# Patient Record
Sex: Female | Born: 1950 | Race: White | Hispanic: No | Marital: Married | Smoking: Former smoker
Health system: Southern US, Community
[De-identification: ages and names within clinical notes are randomized; demographics above are authoritative.]

## PROBLEM LIST (undated history)

## (undated) DIAGNOSIS — Z5111 Encounter for antineoplastic chemotherapy: Secondary | ICD-10-CM

## (undated) DIAGNOSIS — C3411 Malignant neoplasm of upper lobe, right bronchus or lung: Secondary | ICD-10-CM

## (undated) DIAGNOSIS — C539 Malignant neoplasm of cervix uteri, unspecified: Secondary | ICD-10-CM

## (undated) DIAGNOSIS — F419 Anxiety disorder, unspecified: Secondary | ICD-10-CM

## (undated) DIAGNOSIS — E119 Type 2 diabetes mellitus without complications: Secondary | ICD-10-CM

## (undated) HISTORY — PX: ABDOMINAL HYSTERECTOMY: SHX81

## (undated) HISTORY — DX: Encounter for antineoplastic chemotherapy: Z51.11

## (undated) MED FILL — Dexamethasone Sodium Phosphate Inj 100 MG/10ML: INTRAMUSCULAR | Qty: 1 | Status: AC

---

## 1998-11-20 ENCOUNTER — Ambulatory Visit (HOSPITAL_COMMUNITY): Admission: RE | Admit: 1998-11-20 | Discharge: 1998-11-20 | Payer: Self-pay

## 2003-06-20 ENCOUNTER — Encounter: Payer: Self-pay | Admitting: Family Medicine

## 2003-06-20 ENCOUNTER — Emergency Department (HOSPITAL_COMMUNITY): Admission: AD | Admit: 2003-06-20 | Discharge: 2003-06-20 | Payer: Self-pay | Admitting: Family Medicine

## 2003-06-23 ENCOUNTER — Emergency Department (HOSPITAL_COMMUNITY): Admission: AD | Admit: 2003-06-23 | Discharge: 2003-06-23 | Payer: Self-pay | Admitting: Family Medicine

## 2003-06-26 ENCOUNTER — Emergency Department (HOSPITAL_COMMUNITY): Admission: AD | Admit: 2003-06-26 | Discharge: 2003-06-26 | Payer: Self-pay | Admitting: Family Medicine

## 2013-06-27 ENCOUNTER — Emergency Department (HOSPITAL_COMMUNITY)
Admission: EM | Admit: 2013-06-27 | Discharge: 2013-06-27 | Disposition: A | Discharge status: Home / Self Care | Payer: Self-pay | Source: Home / Self Care

## 2013-06-27 ENCOUNTER — Ambulatory Visit: Payer: Self-pay

## 2013-06-27 ENCOUNTER — Other Ambulatory Visit: Payer: Self-pay | Admitting: Occupational Medicine

## 2013-06-27 DIAGNOSIS — R52 Pain, unspecified: Secondary | ICD-10-CM

## 2013-08-28 ENCOUNTER — Encounter: Payer: Self-pay | Admitting: Internal Medicine

## 2013-08-28 ENCOUNTER — Encounter (INDEPENDENT_AMBULATORY_CARE_PROVIDER_SITE_OTHER): Payer: Self-pay

## 2013-08-28 ENCOUNTER — Encounter: Payer: Self-pay | Admitting: *Deleted

## 2013-08-28 ENCOUNTER — Ambulatory Visit (INDEPENDENT_AMBULATORY_CARE_PROVIDER_SITE_OTHER): Payer: No Typology Code available for payment source | Admitting: Internal Medicine

## 2013-08-28 VITALS — BP 146/86 | HR 101 | Temp 98.1°F | Ht 64.0 in | Wt <= 1120 oz

## 2013-08-28 DIAGNOSIS — R222 Localized swelling, mass and lump, trunk: Secondary | ICD-10-CM

## 2013-08-28 DIAGNOSIS — R918 Other nonspecific abnormal finding of lung field: Secondary | ICD-10-CM

## 2013-08-28 MED ORDER — ALPRAZOLAM 0.5 MG PO TABS: 0.5000 mg | ORAL_TABLET | Freq: Every evening | ORAL | Status: DC | PRN

## 2013-08-28 MED ORDER — HYDROCODONE-ACETAMINOPHEN 10-325 MG PO TABS: 1.0000 | ORAL_TABLET | ORAL | Status: DC | PRN

## 2013-08-28 NOTE — Assessment & Plan Note (Signed)
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

## 2013-08-28 NOTE — Progress Notes (Signed)
   Subjective:    Patient ID: Alexis [name], female    DOB: 05/08/1951  MRN: [id number]  HPI  30 yowf smoker new onset hemoptysis mid Dec 2014 dx  with lung mass referred to pulmonary  08/28/2013 for eval.  08/28/2013 1st [facility] office visit/ Silena Wyss cc new onset abrupt variably severe hemoptysis mid dec 2014 assoc pleuritic and positional pain under R scapula inferiorly and some yellow mucus  rx as pna and hemoptysis improved but pain not better and barely tolerable on vicodin 5 q 4.  Still having daily prod  Up to sev tsp bloody mucus esp in ams.  CT c/w RUL mass and hilar adenopathy  No obvious pattern in  day to day or daytime variabilty or assoc sob or chest tightness, subjective wheeze overt sinus or hb symptoms. No unusual exp hx or h/o childhood pna/ asthma or knowledge of premature birth.  Sleeping ok without nocturnal  or early am exacerbation  of respiratory  c/o's or need for noct saba. Also denies any obvious fluctuation of symptoms with weather or environmental changes or other aggravating or alleviating factors except as outlined above   Current Medications, Allergies, Complete Past Medical History, Past Surgical History, Family History, and Social History were reviewed in [employer] record.          Review of Systems  Constitutional: Positive for unexpected weight change. Negative for fever and chills.  HENT: Negative for congestion, dental problem, ear pain, nosebleeds, postnasal drip, rhinorrhea, sinus pressure, sneezing, sore throat, trouble swallowing and voice change.   Eyes: Negative for visual disturbance.  Respiratory: Positive for cough. Negative for choking and shortness of breath.   Cardiovascular: Negative for chest pain and leg swelling.  Gastrointestinal: Negative for vomiting, abdominal pain and diarrhea.  Genitourinary: Negative for difficulty urinating.  Musculoskeletal: Negative for arthralgias.  Skin: Negative for rash.   Neurological: Negative for tremors, syncope and headaches.  Hematological: Does not bruise/bleed easily.       Objective:   Physical Exam  Anxious amb wf nad  Wt Readings from Last 3 Encounters:  08/28/13 11 lb (4.99 kg)      HEENT mild turbinate edema.  Oropharynx edentulous no thrush or excess pnd or cobblestoning.  No JVD or cervical adenopathy. Mild accessory muscle hypertrophy. Trachea midline, nl thryroid. Chest was hyperinflated by percussion with diminished breath sounds and moderate increased exp time without wheeze. Hoover sign positive at mid inspiration. Regular rate and rhythm without murmur gallop or rub or increase P2 or edema.  Abd: no hsm, nl excursion. Ext warm without cyanosis - POS mod severe clubbing.    Chest Ct [facility]12/22/14 4 cm RUL postapical mass with borderline adenopathy    Assessment & Plan:

## 2013-08-28 NOTE — Patient Instructions (Signed)
For pain take vicodin 10-325 every 4 hours as needed  For nerves take xanax 0.5 mg every 6 hours as needed   Nothing to eat after midnight Thursday  Come to outpatient registration at [facility]  Friday Jan 2 at 715 am for bronchoscopy

## 2013-08-30 ENCOUNTER — Ambulatory Visit (HOSPITAL_COMMUNITY)
Admission: RE | Admit: 2013-08-30 | Discharge: 2013-08-30 | Disposition: A | Discharge status: Home / Self Care | Payer: BC Managed Care – PPO | Source: Ambulatory Visit | Attending: Internal Medicine | Admitting: Internal Medicine

## 2013-08-30 ENCOUNTER — Encounter (HOSPITAL_COMMUNITY): Payer: Self-pay | Admitting: Respiratory Therapy

## 2013-08-30 ENCOUNTER — Ambulatory Visit (HOSPITAL_COMMUNITY): Payer: BC Managed Care – PPO

## 2013-08-30 ENCOUNTER — Encounter (HOSPITAL_COMMUNITY)
Admission: RE | Disposition: A | Discharge status: Home / Self Care | Payer: Self-pay | Source: Ambulatory Visit | Attending: Internal Medicine

## 2013-08-30 DIAGNOSIS — F17200 Nicotine dependence, unspecified, uncomplicated: Secondary | ICD-10-CM | POA: Insufficient documentation

## 2013-08-30 DIAGNOSIS — R222 Localized swelling, mass and lump, trunk: Secondary | ICD-10-CM

## 2013-08-30 DIAGNOSIS — J449 Chronic obstructive pulmonary disease, unspecified: Secondary | ICD-10-CM | POA: Insufficient documentation

## 2013-08-30 DIAGNOSIS — I517 Cardiomegaly: Secondary | ICD-10-CM | POA: Insufficient documentation

## 2013-08-30 DIAGNOSIS — J4489 Other specified chronic obstructive pulmonary disease: Secondary | ICD-10-CM | POA: Insufficient documentation

## 2013-08-30 DIAGNOSIS — C3410 Malignant neoplasm of upper lobe, unspecified bronchus or lung: Secondary | ICD-10-CM | POA: Insufficient documentation

## 2013-08-30 HISTORY — PX: VIDEO BRONCHOSCOPY: SHX5072

## 2013-08-30 SURGERY — BRONCHOSCOPY, WITH FLUOROSCOPY
Anesthesia: Moderate Sedation | Laterality: Bilateral

## 2013-08-30 MED ORDER — PHENYLEPHRINE HCL 0.25 % NA SOLN: 1.0000 | Freq: Four times a day (QID) | NASAL | Status: DC | PRN

## 2013-08-30 MED ORDER — SODIUM CHLORIDE 0.9 % IV SOLN
INTRAVENOUS | Status: DC
Start: 2013-08-30 — End: 2013-08-30

## 2013-08-30 MED ORDER — MIDAZOLAM HCL 10 MG/2ML IJ SOLN
INTRAMUSCULAR | Status: DC | PRN
  Administered 2013-08-30: 2.5 mg via INTRAVENOUS

## 2013-08-30 MED ORDER — MIDAZOLAM HCL 10 MG/2ML IJ SOLN: 1.0000 mg | Freq: Once | INTRAMUSCULAR | Status: DC

## 2013-08-30 MED ORDER — MEPERIDINE HCL 100 MG/ML IJ SOLN
INTRAMUSCULAR | Status: AC
  Filled 2013-08-30: qty 2

## 2013-08-30 MED ORDER — PHENYLEPHRINE HCL 0.25 % NA SOLN
NASAL | Status: DC | PRN
Start: 2013-08-30 — End: 2013-08-30
  Administered 2013-08-30: 1 via NASAL

## 2013-08-30 MED ORDER — LIDOCAINE HCL 2 % EX GEL
Freq: Once | CUTANEOUS | Status: DC
  Filled 2013-08-30: qty 5

## 2013-08-30 MED ORDER — LIDOCAINE HCL 2 % EX GEL
CUTANEOUS | Status: DC | PRN
  Administered 2013-08-30: 1

## 2013-08-30 MED ORDER — MEPERIDINE HCL 25 MG/ML IJ SOLN
INTRAMUSCULAR | Status: DC | PRN
  Administered 2013-08-30 (×2): 50 mg via INTRAVENOUS

## 2013-08-30 MED ORDER — MEPERIDINE HCL 100 MG/ML IJ SOLN: 100.0000 mg | Freq: Once | INTRAMUSCULAR | Status: DC

## 2013-08-30 MED ORDER — LIDOCAINE HCL 1 % IJ SOLN
INTRAMUSCULAR | Status: DC | PRN
  Administered 2013-08-30: 6 mL via RESPIRATORY_TRACT

## 2013-08-30 MED ORDER — MIDAZOLAM HCL 10 MG/2ML IJ SOLN
INTRAMUSCULAR | Status: AC
  Filled 2013-08-30: qty 4

## 2013-08-30 NOTE — Progress Notes (Signed)
Video Bronchoscopy done  Intervention Bronchial washing done Intervention Bronchial Biopsy done  Procedure tolerated well

## 2013-08-30 NOTE — H&P (Signed)
HPI   70 yowf smoker new onset hemoptysis mid Dec 2014 dx  with lung mass referred to pulmonary  08/28/2013 for eval.   08/28/2013 1st [place] office visit/ Alexis [name] cc new onset abrupt variably severe hemoptysis mid dec 2014 assoc pleuritic and positional pain under R scapula inferiorly and some yellow mucus  rx as pna and hemoptysis improved but pain not better and barely tolerable on vicodin 5 q 4.  Still having daily prod  Up to sev tsp bloody mucus esp in ams.   CT c/w RUL mass and hilar adenopathy   No obvious pattern in  day to day or daytime variabilty or assoc sob or chest tightness, subjective wheeze overt sinus or hb symptoms. No unusual exp hx or h/o childhood pna/ asthma or knowledge of premature birth.   Sleeping ok without nocturnal  or early am exacerbation  of respiratory  c/o's or need for noct saba. Also denies any obvious fluctuation of symptoms with weather or environmental changes or other aggravating or alleviating factors except as outlined above    Current Medications, Allergies, Complete Past Medical History, Past Surgical History, Family History, and Social History were reviewed in [employer] record.            Review of Systems  Constitutional: Positive for unexpected weight change. Negative for fever and chills.  HENT: Negative for congestion, dental problem, ear pain, nosebleeds, postnasal drip, rhinorrhea, sinus pressure, sneezing, sore throat, trouble swallowing and voice change.   Eyes: Negative for visual disturbance.  Respiratory: Positive for cough. Negative for choking and shortness of breath.   Cardiovascular: Negative for chest pain and leg swelling.  Gastrointestinal: Negative for vomiting, abdominal pain and diarrhea.  Genitourinary: Negative for difficulty urinating.  Musculoskeletal: Negative for arthralgias.  Skin: Negative for rash.  Neurological: Negative for tremors, syncope and headaches.   Hematological: Does not bruise/bleed easily.          Objective:     Physical Exam   Anxious amb wf nad   Wt Readings from Last 3 Encounters:   08/28/13  11 lb (4.99 kg)         HEENT mild turbinate edema.  Oropharynx edentulous no thrush or excess pnd or cobblestoning.  No JVD or cervical adenopathy. Mild accessory muscle hypertrophy. Trachea midline, nl thryroid. Chest was hyperinflated by percussion with diminished breath sounds and moderate increased exp time without wheeze. Hoover sign positive at mid inspiration. Regular rate and rhythm without murmur gallop or rub or increase P2 or edema.  Abd: no hsm, nl excursion. Ext warm without cyanosis - POS mod severe clubbing.      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 - Oral      ALPRAZolam (XANAX) 0.5 MG tablet 40 tablet 0 08/28/2013        Take 1 tablet (0.5 mg total) by mouth at bedtime as needed for anxiety. - Oral              Discontinued Medications        Reason for Discontinue      HYDROcodone-acetaminophen (NORCO) 10-325 MG per tablet Reorder                  Patient Instructions      For pain take vicodin 10-325 every 4 hours as needed   For nerves take xanax 0.5 mg every 6 hours as needed    Nothing to eat after midnight Thursday   Come to outpatient registration  at [place]  Friday Jan 2 at 34 am for bronchoscopy   08/30/2013 fob day, no change in hx or exam   [name], MD Pulmonary and [place] [phone] After 5:30 PM or weekends, call [phone]

## 2013-08-30 NOTE — Discharge Instructions (Signed)
Flexible Bronchoscopy Care After These instructions give you information on caring for yourself after your procedure. Your doctor may also give you specific instructions. Call your doctor if you have any problems or questions after your procedure. HOME CARE  Do not eat or drink anything for 2 hours after your test.  After 2 hours have passed, eat soft food and drink liquids slowly.  The day after the test, go back to eating as normal.  Continue normal activities.  Keep all doctor visits if tissue samples (biopsies) were taken. Finding out the results of your test Ask when your test results will be ready. Make sure you get your test results. GET HELP RIGHT AWAY IF:  You get lightheaded.  You get short of breath.  You feel like you are going to pass out (faint).  You have chest pain.  You cough up blood. MAKE SURE YOU:  Understand these instructions.  Will watch your condition.  Will get help right away if you are not doing well or get worse. Document Released: 06/12/2009 Document Revised: 11/07/2011 Document Reviewed: 06/12/2009 [facility] Patient Information 2014 [place], [place].  Nothing to eat or drink until 11:00 am  Today    08/30/2013

## 2013-08-30 NOTE — Op Note (Signed)
Bronchoscopy Procedure Note  Date of Operation: 08/30/2013   Pre-op Diagnosis: lung mass  Post-op Diagnosis: same  Surgeon: [name]  Anesthesia: Monitored Local Anesthesia with Sedation  Operation: Video Flexible fiberoptic bronchoscopy, diagnostic   Findings: nl  airways  Specimen: BAL RUL,  Tbbx x  RUL  Estimated Blood Loss: min   Complications: non  Indications and History: See updated H and P same date. The risks, benefits, complications, treatment options and expected outcomes were discussed with the patient.  The possibilities of reaction to medication, pulmonary aspiration, perforation of a viscus, bleeding, failure to diagnose a condition and creating a complication requiring transfusion or operation were discussed with the patient who freely signed the consent.    Description of Procedure: The patient was re-examined in the bronchoscopy suite and the site of surgery properly noted/marked.  The patient was identified  and the procedure verified as Flexible Fiberoptic Bronchoscopy.  A Time Out was held and the above information confirmed.   After the induction of topical nasopharyngeal anesthesia, the patient was positioned  and the bronchoscope was passed through the R naris. The vocal cords were visualized and  1% buffered lidocaine 5 ml was topically placed onto the cords. The cords were nl. The scope was then passed into the trachea.  1% buffered lidocaine given topically. Airways inspected bilaterally to the subsegmental level with the following findings:  1) all airways nl to the subsegmental level bilaterally   Procedures: 1)BAL for cytology 2) TBBX for histology      The Patient was taken to the Endoscopy Recovery area in satisfactory condition.  Attestation: I performed the procedure.  [name], MD Pulmonary and [place] [phone] After 5:30 PM or weekends, call [phone]

## 2013-09-02 ENCOUNTER — Encounter (HOSPITAL_COMMUNITY): Payer: Self-pay | Admitting: Internal Medicine

## 2013-09-02 ENCOUNTER — Encounter: Payer: Self-pay | Admitting: Internal Medicine

## 2013-09-02 ENCOUNTER — Telehealth: Payer: Self-pay | Admitting: Internal Medicine

## 2013-09-02 ENCOUNTER — Other Ambulatory Visit: Payer: Self-pay | Admitting: Internal Medicine

## 2013-09-02 DIAGNOSIS — R918 Other nonspecific abnormal finding of lung field: Secondary | ICD-10-CM

## 2013-09-02 NOTE — Telephone Encounter (Signed)
ATC line has fast busy signal x 3 wcb

## 2013-09-03 ENCOUNTER — Other Ambulatory Visit: Payer: Self-pay | Admitting: Internal Medicine

## 2013-09-03 MED ORDER — HYDROCODONE-ACETAMINOPHEN 10-325 MG PO TABS: 1.0000 | ORAL_TABLET | ORAL | Status: DC | PRN

## 2013-09-03 NOTE — Progress Notes (Signed)
Quick Note:  Pt aware ______ 

## 2013-09-04 NOTE — Telephone Encounter (Signed)
ATC line busy, wcb. Jennifer Castillo, CMA  

## 2013-09-05 NOTE — Telephone Encounter (Signed)
I spoke with the pt and she gave permission to speak with her sister. I spoke with her sister and she states the pt is having a hard time dealing with the news of her diagnosis. Dr. Melvyn Novas did prescribe the pt xanax but the pt sister states this is not helping. She states the pt is having very high, highs and very low, lows as far as mood goes. Pt is not resting well either. I advised them to contact PCP for further recs and meds since xanax from MW was not working well. They will do so. Munjor Bing, CMA

## 2013-09-09 ENCOUNTER — Encounter: Payer: Self-pay | Admitting: Internal Medicine

## 2013-09-09 ENCOUNTER — Ambulatory Visit (INDEPENDENT_AMBULATORY_CARE_PROVIDER_SITE_OTHER): Payer: BC Managed Care – PPO | Admitting: Internal Medicine

## 2013-09-09 ENCOUNTER — Encounter (HOSPITAL_COMMUNITY)
Admission: RE | Admit: 2013-09-09 | Discharge: 2013-09-09 | Disposition: A | Discharge status: Home / Self Care | Payer: BC Managed Care – PPO | Source: Ambulatory Visit | Attending: Internal Medicine | Admitting: Internal Medicine

## 2013-09-09 ENCOUNTER — Encounter: Payer: Self-pay | Admitting: *Deleted

## 2013-09-09 VITALS — BP 114/80 | HR 97 | Temp 98.0°F | Ht 64.5 in | Wt 109.0 lb

## 2013-09-09 DIAGNOSIS — R918 Other nonspecific abnormal finding of lung field: Secondary | ICD-10-CM

## 2013-09-09 DIAGNOSIS — J449 Chronic obstructive pulmonary disease, unspecified: Secondary | ICD-10-CM

## 2013-09-09 DIAGNOSIS — R222 Localized swelling, mass and lump, trunk: Secondary | ICD-10-CM

## 2013-09-09 LAB — GLUCOSE, CAPILLARY: Glucose-Capillary: 102 mg/dL — ABNORMAL HIGH (ref 70–99)

## 2013-09-09 MED ORDER — ALPRAZOLAM 0.5 MG PO TABS: ORAL_TABLET | ORAL | Status: DC

## 2013-09-09 MED ORDER — FLUDEOXYGLUCOSE F - 18 (FDG) INJECTION: 19.0000 | Freq: Once | INTRAVENOUS | Status: AC | PRN

## 2013-09-09 NOTE — Patient Instructions (Signed)
Please see patient coordinator before you leave today  to schedule T surgery evaluation asap  Call me if you have any question after you talk to the surgeons regarding feasibility and timing of surgery   In the meantime ok to use the nicotine patches and if they are not effective then ok to use the e cigarettes but you must not smoke x 2 weeks before surgery

## 2013-09-09 NOTE — Assessment & Plan Note (Signed)
-    Spirometry 09/09/2013   FEV1  1.80 (73%) ratio 67  Could tol RULobectomy if a candidate but needs to stop smoking first and would benefit from periop duoneb but nothing else needed for now

## 2013-09-09 NOTE — Assessment & Plan Note (Signed)
-   CT chest 08/19/13 [place] - FOB 08/30/13 > nl airways, tbbx c/w NSC favor adenoca - PET  09/09/2013 > 1. Right upper lobe lung mass has a maximum diameter of 3.9 cm and  is consistent with primary lung neoplasm.  2. Ground-glass attenuation and interstitial thickening is noted  within the surrounding a right upper lobe. In the setting of recent  biopsy these findings may reflect areas of post biopsy hemorrhage.  Alternatively, this may be seen with lymphangitic spread of tumor.  3. No evidence for hypermetabolic metastases.   I had an extended discussion with the patient today lasting 15 to 20 minutes of a 25 minute visit on the following issues:   Discussed in detail all the  indications, usual  risks and alternatives  relative to the benefits with patient   I don't see a contraindication to attempting surgical cure so will refer to T surgery - she agrees to consider surgery.

## 2013-09-09 NOTE — Progress Notes (Signed)
   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   History of Present Illness  08/28/2013 1st [facility] [place] [facility] office visit/ Alexis [name] cc new onset abrupt variably severe hemoptysis mid dec 2014 assoc pleuritic and positional pain under R scapula inferiorly and some yellow mucus  rx as pna and hemoptysis improved but pain not better and barely tolerable on vicodin 5 q 4.  Still having daily prod  Up to sev tsp bloody mucus esp in ams.  CT c/w RUL mass and hilar adenopathy > rec FOB  FOB 08/30/13 > nl airways, tbbx c/w Alexis [name] favor adenoca   09/09/2013 f/u ov/Alexis [name] re: [facility] RUL Chief Complaint  Patient presents with  . Follow-up    Pt had PET scan and spiro done today. She denies any new respiratory co's.      Not limited by breathing, still coughing up blood less than a tbsp per day  No obvious day to day or daytime variabilty or assoc   chest tightness, subjective wheeze overt sinus or hb symptoms. No unusual exp hx or h/o childhood pna/ asthma or knowledge of premature birth.  Sleeping ok without nocturnal  or early am exacerbation  of respiratory  c/o's or need for noct saba. Also denies any obvious fluctuation of symptoms with weather or environmental changes or other aggravating or alleviating factors except as outlined above   Current Medications, Allergies, Complete Past Medical History, Past Surgical History, Family History, and Social History were reviewed in [employer] record.  ROS  The following are not active complaints unless bolded sore throat, dysphagia, dental problems, itching, sneezing,  nasal congestion or excess/ purulent secretions, ear ache,   fever, chills, sweats, unintended wt loss, pleuritic or exertional cp,  hemoptysis,  orthopnea pnd or leg swelling, presyncope, palpitations, heartburn, abdominal pain, anorexia, nausea, vomiting, diarrhea  or change in bowel or urinary habits, change in stools or urine, dysuria,hematuria,  rash, arthralgias, visual complaints, headache, numbness weakness or ataxia or problems with walking or coordination,  change in mood/affect or memory.                     Objective:   Physical Exam  Anxious amb wf nad   Wt Readings from Last 3 Encounters:  09/09/13 109 lb (49.442 kg)  08/28/13 11 lb (4.99 kg)       HEENT mild turbinate edema.  Oropharynx edentulous no thrush or excess pnd or cobblestoning.  No JVD or cervical adenopathy. Mild accessory muscle hypertrophy. Trachea midline, nl thryroid. Chest was hyperinflated by percussion with diminished breath sounds and moderate increased exp time without wheeze. Hoover sign positive at mid inspiration. Regular rate and rhythm without murmur gallop or rub or increase P2 or edema.  Abd: no hsm, nl excursion. Ext warm without cyanosis - POS mod severe clubbing.    Chest Ct [facility]12/22/14 4 cm RUL postapical mass with borderline adenopathy  PET 09/09/2013 > neg hilar/ med nodes    Assessment & Plan:

## 2013-09-17 ENCOUNTER — Encounter: Payer: Self-pay | Admitting: Thoracic Surgery (Cardiothoracic Vascular Surgery)

## 2013-09-17 ENCOUNTER — Institutional Professional Consult (permissible substitution) (INDEPENDENT_AMBULATORY_CARE_PROVIDER_SITE_OTHER): Payer: BC Managed Care – PPO | Admitting: Thoracic Surgery (Cardiothoracic Vascular Surgery)

## 2013-09-17 VITALS — BP 123/57 | HR 100 | Resp 20 | Ht 64.5 in | Wt 109.0 lb

## 2013-09-17 DIAGNOSIS — R918 Other nonspecific abnormal finding of lung field: Secondary | ICD-10-CM

## 2013-09-17 DIAGNOSIS — R222 Localized swelling, mass and lump, trunk: Secondary | ICD-10-CM

## 2013-09-17 NOTE — Progress Notes (Signed)
PCP is [name], MD Referring Provider is [name], MD  Chief Complaint  Patient presents with  . Lung Mass    Surgical eval on right upper lobe lung mass- referral from Dr Melvyn Novas, PET Scan 09/09/13, Chest CT 08/19/13    HPI: 63 year old woman sent for consultation regarding a right upper lobe mass.  Alexis [name] is a 63 year old woman with a 40-pack-year history of tobacco abuse who was in her usual state of health until December. She developed a cough productive of yellow sputum and pain in the right subscapular region. She then developed hemoptysis. She was treated for potential pneumonia but continued to have pain and hemoptysis. A CT of the chest showed 4 cm mass in the right upper lobe. There was borderline enlargement of hilar or mediastinal lymph nodes. She was referred to Dr. Melvyn Novas he did bronchoscopy. 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 A PET CT was done which showed the right upper lobe mass to be hypermetabolic. It abuts the ribs, but there is no rib destruction or chest wall activity on the PET. There is no evidence of hilar or mediastinal involvement.  Past medical history  Cervical cancer 25 years ago  Tobacco abuse  Emphysema -newly diagnosed   No past medical history on file.  Past Surgical History  Procedure Laterality Date  . Video bronchoscopy Bilateral 08/30/2013    Procedure: VIDEO BRONCHOSCOPY WITH FLUORO;  Surgeon: Tanda Rockers, MD;  Location: [facility];  Service: Cardiopulmonary;  Laterality: Bilateral;    Family History  Problem Relation Age of Onset  . Emphysema Father     smoked  . Lung cancer Father     smoked    Social History History  Substance Use Topics  . Smoking status: Current Every Day Smoker -- 1.00 packs/day for 40 years    Types: Cigarettes  . Smokeless tobacco: Never Used  . Alcohol Use: No    Current Outpatient Prescriptions  Medication Sig Dispense Refill  . ALPRAZolam (XANAX) 0.5 MG tablet One four  times a day as needed for nerves or pain  120 tablet  0  . HYDROcodone-acetaminophen (NORCO) 10-325 MG per tablet Take 1 tablet by mouth every 4 (four) hours as needed.  40 tablet  0   No current facility-administered medications for this visit.    Allergies  Allergen Reactions  . Codeine Nausea And Vomiting    Review of Systems  Constitutional: Positive for appetite change, fatigue and unexpected weight change (Lost 12 pounds in 3 months).  Respiratory: Positive for cough (Hemoptysis).   All other systems reviewed and are negative.    BP 123/57  Pulse 100  Resp 20  Ht 5' 4.5" (1.638 m)  Wt 109 lb (49.442 kg)  BMI 18.43 kg/m2  SpO2 98% Physical Exam  Vitals reviewed. Constitutional: She is oriented to person, place, and time.  Thin 63 year old woman in no acute distress  HENT:  Head: Normocephalic and atraumatic.  Eyes: EOM are normal. Pupils are equal, round, and reactive to light.  Neck: Neck supple. No thyromegaly present.  Cardiovascular: Normal rate and regular rhythm.  Exam reveals no gallop and no friction rub.   No murmur heard. Pulmonary/Chest: Effort normal and breath sounds normal. She has no wheezes. She has no rales.  Abdominal: Soft. There is no tenderness.  Musculoskeletal: She exhibits no edema.  Positive clubbing, no cyanosis  Lymphadenopathy:    She has no cervical adenopathy.  Neurological: She is alert and oriented to person, place, and  time. No cranial nerve deficit.  No focal motor deficits  Skin: Skin is warm and dry.  Psychiatric: She has a normal mood and affect.     Diagnostic Tests: NUCLEAR MEDICINE PET SKULL BASE TO THIGH  FASTING BLOOD GLUCOSE: Value: 102mg /dl  TECHNIQUE:  19.0 mCi F-18 FDG was injected intravenously. CT data was obtained  and used for attenuation correction and anatomic localization only.  (This was not acquired as a diagnostic CT examination.) Additional  exam technical data entered on technologist worksheet.   COMPARISON: CT chest 12/ 22/14  FINDINGS:  NECK  No hypermetabolic lymph nodes in the neck.  CHEST  There is no pleural effusion identified. Right upper lobe lung mass  has a maximum diameter measuring 3.9 cm, image 64. The SUV max  associated with this mass is equal to 16.7, image 64. There is  surrounding ground-glass attenuation and interstitial thickening. If  the patient has had a recent biopsy within this area this may  reflect post biopsy hemorrhage. A similar appearance may be seen  with lymphangitic spread of tumor. There is no hypermetabolic  mediastinal or hilar lymph nodes. No hypermetabolic axillary or  supraclavicular adenopathy identified.  ABDOMEN/PELVIS  No abnormal hypermetabolic activity within the liver, pancreas,  adrenal glands, or spleen. No hypermetabolic lymph nodes in the  abdomen or pelvis. Colonic diverticulosis identified. There is a  focal segment of proximal transverse colon which exhibits abnormal  wall edema/thickening. This is best seen on image 163 of series 2  and is of on certain clinical significance. There is no significant  FDG uptake identified within this segment of thickened bowel.  SKELETON  No focal hypermetabolic activity to suggest skeletal metastasis.  IMPRESSION:  1. Right upper lobe lung mass has a maximum diameter of 3.9 cm and  is consistent with primary lung neoplasm.  2. Ground-glass attenuation and interstitial thickening is noted  within the surrounding a right upper lobe. In the setting of recent  biopsy these findings may reflect areas of post biopsy hemorrhage.  Alternatively, this may be seen with lymphangitic spread of tumor.  3. No evidence for hypermetabolic metastases.  Electronically Signed  By: Kerby Moors M.D.  On: 09/09/2013 15:27  Pulmonary function testing  FVC 2.70 (86%) FEV1 1.80 (73%)  FEF 25-75 1.28 (53%)  Impression:  Alexis [name] is a 63 year old woman with a 40-pack-year history of smoking who  presents with hemoptysis and right subscapular pain. She has a 4 cm hypermetabolic mass in the right upper lobe abutting the chest wall. 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 This likely is a T3 lesion with local invasion of the chest wall. This is a rather tricky location as it is abutting the ribs as they adjoin the transverse processes of the spine. This mass is likely resect would require chest wall resection.  She has no evidence of hilar or mediastinal involvement by PET CT. She has not yet had a brain MR and we will go ahead and order that prior to surgical resection.  Will discuss the case at our multidisciplinary thoracic oncology conference this week and she will most likely need a combination of surgery, radiation, and chemotherapy.  I did discuss potential surgical resection with her. She understands that the proposed procedure would be a right VATS, likely thoracotomy, right upper lobectomy, possible chest wall resection. I described the operation her in detail. She understands the need for general anesthesia, incisions to be used, expected hospital stay, and the overall recovery. We discussed  the indications, risks, benefits, and alternatives. She understands the risks include, but are not limited to death, MI, DVT, PE, bleeding, possible need for transfusion, infection, chronic pain, cardiac arrhythmias, prolonged air leaks, respiratory failure, renal failure, and gastrointestinal complications. She is willing to accept these risks and proceed with surgery.   Plan: 1. MR brain to rule out metastases  2. Discuss case at [facility]  3. I will see her back in the office in one week to finalize our plan

## 2013-09-18 ENCOUNTER — Other Ambulatory Visit: Payer: Self-pay | Admitting: *Deleted

## 2013-09-18 ENCOUNTER — Encounter: Payer: Self-pay | Admitting: *Deleted

## 2013-09-18 DIAGNOSIS — R918 Other nonspecific abnormal finding of lung field: Secondary | ICD-10-CM

## 2013-09-23 ENCOUNTER — Telehealth: Payer: Self-pay | Admitting: *Deleted

## 2013-09-23 NOTE — Telephone Encounter (Signed)
I called patient to f/u her visit with Dr. Roxan Hockey on 09/17/13.  Patient able to teackback the current plan of care as discussed at her visit.  Patient reports that she is doing OK and is anxious to complete the brain MRI on 10/01/13 so that she can proceed with her treatment.  She denied any questions or concerns at this time.  I verified that she had my contact information and encouraged her to call me for any needs.

## 2013-10-01 ENCOUNTER — Ambulatory Visit (INDEPENDENT_AMBULATORY_CARE_PROVIDER_SITE_OTHER): Payer: BC Managed Care – PPO | Admitting: Thoracic Surgery (Cardiothoracic Vascular Surgery)

## 2013-10-01 ENCOUNTER — Encounter: Payer: Self-pay | Admitting: Thoracic Surgery (Cardiothoracic Vascular Surgery)

## 2013-10-01 ENCOUNTER — Encounter: Payer: Self-pay | Admitting: Internal Medicine

## 2013-10-01 ENCOUNTER — Ambulatory Visit
Admission: RE | Admit: 2013-10-01 | Discharge: 2013-10-01 | Disposition: A | Discharge status: Home / Self Care | Payer: BC Managed Care – PPO | Source: Ambulatory Visit | Attending: Thoracic Surgery (Cardiothoracic Vascular Surgery) | Admitting: Thoracic Surgery (Cardiothoracic Vascular Surgery)

## 2013-10-01 VITALS — BP 125/64 | HR 109 | Resp 16 | Ht 64.5 in | Wt 109.0 lb

## 2013-10-01 DIAGNOSIS — R222 Localized swelling, mass and lump, trunk: Secondary | ICD-10-CM

## 2013-10-01 DIAGNOSIS — R918 Other nonspecific abnormal finding of lung field: Secondary | ICD-10-CM

## 2013-10-01 MED ORDER — GADOBENATE DIMEGLUMINE 529 MG/ML IV SOLN
9.0000 mL | Freq: Once | INTRAVENOUS | Status: AC | PRN
  Administered 2013-10-01: 9 mL via INTRAVENOUS

## 2013-10-01 NOTE — Progress Notes (Signed)
HPI:  Alexis [name] returns today to discuss the results of her MR.  Alexis [name] is a 63 year old woman with a 40-pack-year history of smoking who presented with hemoptysis and right subscapular pain. She has a 4 cm hypermetabolic mass in the right upper lobe abutting the chest wall. 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 This likely is a T3 lesion with local invasion of the chest wall. This is a rather tricky location as it is abutting the ribs as they adjoin the transverse processes of the spine. This mass is likely resectable, but would require chest wall resection.  We discussed last week the possibility of surgical resection but I wanted to get a brain MR first.  We did discuss the case it [place] last week in the general consensus was to proceed with surgery.  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 ALPRAZolam (XANAX) 0.5 MG tablet One four times a day as needed for nerves or pain  120 tablet  0  . HYDROcodone-acetaminophen (NORCO) 10-325 MG per tablet Take 1 tablet by mouth every 4 (four) hours as needed.  40 tablet  0   No current facility-administered medications for this visit.    Physical Exam BP 125/64  Pulse 109  Resp 16  Ht 5' 4.5" (1.638 m)  Wt 109 lb (49.442 kg)  BMI 18.43 kg/m2  SpO2 96% Thin 63 yo woman, appears older than stated age No acute distress Emotionally labile but no focal neurologic deficits  Diagnostic Tests: MR brain 10/01/2013 CLINICAL DATA: Lung mass.  BUN and creatinine were obtained on site at [place] at  [address]  Results: BUN 6.0 mg/dL, Creatinine 0.6 mg/dL.  EXAM:  MRI HEAD WITHOUT AND WITH CONTRAST  TECHNIQUE:  Multiplanar, multiecho pulse sequences of the brain and surrounding  structures were obtained without and with intravenous contrast.  CONTRAST: 21mL MULTIHANCE GADOBENATE DIMEGLUMINE  529 MG/ML IV SOLN  COMPARISON: None.  FINDINGS:  In a by enhancing subcortical lesion in the high right parietal lobe  measures 8 x 8.5 x 9.5 mm. No other focal enhancing lesions are  present. Scattered periventricular and subcortical white matter  changes are otherwise symmetric. No acute infarct or hemorrhage is  present. The ventricles are of normal size. No significant  extra-axial fluid collection is present. No focal lesion within the  left parietal skull is not expanded bone that measures 8 mm.  Restricted diffusion is evident within this lesion. Although there  is no definite enhancement, this raises concern for an osseous  metastasis.  Flow is present in the major intracranial arteries. The globes and  orbits are intact. A polyp or mucous retention cyst is present in  the right sphenoid sinus. There is opacification of a single left  ethmoid air cell. Mild mucosal thickening is noted along the  anterior left frontal sinus and at the floor of the maxillary  sinuses bilaterally, left greater than right.  IMPRESSION:  1. 8 x 8.5 x 9.5 mm ring-enhancing lesion in the right parietal lobe  with surrounding edema is most concerning for a focal metastasis.  2. 8 mm lesion in the left parietal skull is also concerning for  metastasis. This could represent a benign sclerotic lesion. No other  definite skull lesions are evident.  3. Mild atrophy and diffuse white matter disease. This likely  reflects the sequelae of chronic microvascular ischemia.  Electronically Signed  By: Lawrence Santiago M.D.  On: 10/01/2013 12:30   Impression: 63 year old woman with a history of tobacco abuse and COPD who has a large right upper lobe mass likely invading the rib cage posteriorly. 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 There also was a question of a skull metastasis on the MR.  At this point I think we should step back and have her formally evaluated by oncology and radiation  oncology, and possibly by neurosurgery as well. We need to come up with the multidisciplinary plan to best treat Alexis [name].  I will have her come to [place] this Thursday so that we can establish a definitive treatment plan.

## 2013-10-03 ENCOUNTER — Other Ambulatory Visit: Payer: Self-pay | Admitting: *Deleted

## 2013-10-03 ENCOUNTER — Encounter: Payer: Self-pay | Admitting: *Deleted

## 2013-10-03 ENCOUNTER — Ambulatory Visit
Admission: RE | Admit: 2013-10-03 | Discharge: 2013-10-03 | Disposition: A | Discharge status: Home / Self Care | Payer: BC Managed Care – PPO | Source: Ambulatory Visit | Attending: Radiation Oncology | Admitting: Radiation Oncology

## 2013-10-03 ENCOUNTER — Ambulatory Visit: Payer: BC Managed Care – PPO | Attending: Radiation Oncology | Admitting: Physical Therapy

## 2013-10-03 DIAGNOSIS — C7949 Secondary malignant neoplasm of other parts of nervous system: Principal | ICD-10-CM

## 2013-10-03 DIAGNOSIS — R293 Abnormal posture: Secondary | ICD-10-CM | POA: Insufficient documentation

## 2013-10-03 DIAGNOSIS — M549 Dorsalgia, unspecified: Secondary | ICD-10-CM | POA: Insufficient documentation

## 2013-10-03 DIAGNOSIS — R918 Other nonspecific abnormal finding of lung field: Secondary | ICD-10-CM

## 2013-10-03 DIAGNOSIS — C7931 Secondary malignant neoplasm of brain: Secondary | ICD-10-CM

## 2013-10-03 DIAGNOSIS — R222 Localized swelling, mass and lump, trunk: Secondary | ICD-10-CM | POA: Insufficient documentation

## 2013-10-03 DIAGNOSIS — R599 Enlarged lymph nodes, unspecified: Secondary | ICD-10-CM | POA: Insufficient documentation

## 2013-10-03 DIAGNOSIS — IMO0001 Reserved for inherently not codable concepts without codable children: Secondary | ICD-10-CM | POA: Insufficient documentation

## 2013-10-03 NOTE — Progress Notes (Signed)
Received order from Dr. Roxan Hockey regarding bone scan.  Test ordered.  Called radiology.  Pt aware of appt time and place.

## 2013-10-04 ENCOUNTER — Other Ambulatory Visit: Payer: Self-pay | Admitting: Radiation Therapy

## 2013-10-04 ENCOUNTER — Telehealth: Payer: Self-pay | Admitting: *Deleted

## 2013-10-04 DIAGNOSIS — C7949 Secondary malignant neoplasm of other parts of nervous system: Principal | ICD-10-CM

## 2013-10-04 DIAGNOSIS — C7931 Secondary malignant neoplasm of brain: Secondary | ICD-10-CM

## 2013-10-04 MED ORDER — DEXAMETHASONE 4 MG PO TABS: 4.0000 mg | ORAL_TABLET | Freq: Two times a day (BID) | ORAL | Status: DC

## 2013-10-04 NOTE — Telephone Encounter (Signed)
Pt called left vm message an I called her back.  She stated she was very upset that bone scan is 3 weeks away.  I apologized for the delay.  Elizebeth Koller RN will work on getting scan earlier.  I stated that I call the radiology dept and they give me a time and date for Feb 23.  At that time I requested an earlier appt time but they were unable to provide me with that.  I explained this to the pt.

## 2013-10-04 NOTE — Telephone Encounter (Signed)
Patient called and left a voicemail regarding questions she had from her appointment at the multidisciplinary clinic 10/03/13.  I returned her call and she asked for assistance to follow up regarding order for dexamethasone that needed to be called to her pharmacy, obtaining any lab work needed prior to her brain MRI on 10/08/13 and rescheduling her full body bone scan.  I spoke with Dr. [name] who ordered the dexamethasone, called [facility] who verified that she did not need additional lab work and was able to reschedule her bone scan at [facility] for 10/07/13.  I called patient back with information.  I spoke with patient who asked me to speak to her sister who expressed appreciation for my call and was able to verbalize appointment times.  Patient denied any other questions or needs at this time.

## 2013-10-07 ENCOUNTER — Encounter (HOSPITAL_COMMUNITY)
Admission: RE | Admit: 2013-10-07 | Discharge: 2013-10-07 | Disposition: A | Discharge status: Home / Self Care | Payer: BC Managed Care – PPO | Source: Ambulatory Visit | Attending: Thoracic Surgery (Cardiothoracic Vascular Surgery) | Admitting: Thoracic Surgery (Cardiothoracic Vascular Surgery)

## 2013-10-07 DIAGNOSIS — R918 Other nonspecific abnormal finding of lung field: Secondary | ICD-10-CM

## 2013-10-07 DIAGNOSIS — R222 Localized swelling, mass and lump, trunk: Secondary | ICD-10-CM | POA: Insufficient documentation

## 2013-10-07 MED ORDER — TECHNETIUM TC 99M MEDRONATE IV KIT
25.0000 | PACK | Freq: Once | INTRAVENOUS | Status: AC | PRN
  Administered 2013-10-07: 25 via INTRAVENOUS

## 2013-10-08 ENCOUNTER — Ambulatory Visit
Admission: RE | Admit: 2013-10-08 | Discharge: 2013-10-08 | Disposition: A | Discharge status: Home / Self Care | Payer: BC Managed Care – PPO | Source: Ambulatory Visit | Attending: Radiation Oncology | Admitting: Radiation Oncology

## 2013-10-08 ENCOUNTER — Encounter: Payer: Self-pay | Admitting: Radiation Oncology

## 2013-10-08 DIAGNOSIS — C7931 Secondary malignant neoplasm of brain: Secondary | ICD-10-CM | POA: Insufficient documentation

## 2013-10-08 DIAGNOSIS — C7949 Secondary malignant neoplasm of other parts of nervous system: Principal | ICD-10-CM

## 2013-10-08 MED ORDER — GADOBENATE DIMEGLUMINE 529 MG/ML IV SOLN
10.0000 mL | Freq: Once | INTRAVENOUS | Status: AC | PRN
  Administered 2013-10-08: 10 mL via INTRAVENOUS

## 2013-10-08 NOTE — Progress Notes (Signed)
Radiation Oncology         [phone] ________________________________  Name: DOROTHIA PASSMORE MRN: [id number]  Date: 10/03/2013  DOB: 1950-09-03  KK:XFGHWE,XHBZJ, MD  [name], *     REFERRING PHYSICIAN: [name], *   DIAGNOSIS: The encounter diagnosis was Secondary malignant neoplasm of brain and spinal cord.   HISTORY OF PRESENT ILLNESS::Alexis [name] is a 63 y.o. female who is seen for an initial consultation visit. The patient is has a 40-pack-year history of smoking. She presented with some right subscapular pain as well as some hemoptysis. The patient underwent workup including a chest rate x-ray as well as a CT scan. The latter demonstrated a right upper lobe lesion. This was suspicious for malignancy and she proceeded to undergo a PET scan on 09/09/2013. This showed hypermetabolic activity involving the right upper lobe lung mass with a maximum diameter of 3.9. The maximum SUV was 16.7. This was consistent with a primary lung neoplasm area at some groundglass attenuation and interstitial thickening was seen additionally within the right upper lobe.  The patient underwent a transbronchial biopsy on 08/30/2013. This showed a poorly differentiated non-small cell carcinoma. The any no phenotype was consistent with adenocarcinoma.  Further workup included an MRI scan of the brain on 10/01/2013. This showed an enhancing subcortical lesion within the right parietal lobe measuring 9.5 mm in maximum dimension. This was a solitary intracranial findings. There also was an 8 mm lesion within the left parietal skull which was concerning for possible metastasis versus benign sclerotic lesion. No other definite skull lesions were seen.  The patient denies any nausea, vision changes, recent headaches.  The patient's case was discussed in multidisciplinary thoracic conference this morning and I am seeing her in today for consideration of possible radiation  treatment.   PREVIOUS RADIATION THERAPY: No   PAST MEDICAL HISTORY:  has no past medical history on file.     PAST SURGICAL HISTORY: Past Surgical History  Procedure Laterality Date  . Video bronchoscopy Bilateral 08/30/2013    Procedure: VIDEO BRONCHOSCOPY WITH FLUORO;  Surgeon: Tanda Rockers, MD;  Location: [facility];  Service: Cardiopulmonary;  Laterality: Bilateral;     FAMILY HISTORY: family history includes COPD in her mother; Cancer in her mother; Emphysema in her father; Hypertension in her mother; Lung cancer in her father.   SOCIAL HISTORY:  reports that she has been smoking Cigarettes.  She has a 40 pack-year smoking history. She has never used smokeless tobacco. She reports that she does not drink alcohol or use illicit drugs.   ALLERGIES: Codeine   MEDICATIONS:  Current Outpatient Prescriptions  Medication Sig Dispense Refill  . ALPRAZolam (XANAX) 0.5 MG tablet One four times a day as needed for nerves or pain  120 tablet  0  . dexamethasone (DECADRON) 4 MG tablet Take 1 tablet (4 mg total) by mouth 2 (two) times daily with a meal.  60 tablet  0  . HYDROcodone-acetaminophen (NORCO) 10-325 MG per tablet Take 1 tablet by mouth every 4 (four) hours as needed.  40 tablet  0   No current facility-administered medications for this encounter.     REVIEW OF SYSTEMS:  A 15 point review of systems is documented in the electronic medical record. This was obtained by the nursing staff. However, I reviewed this with the patient to discuss relevant findings and make appropriate changes.  Pertinent items are noted in HPI.    PHYSICAL EXAM:  vitals were not taken  for this visit.  ECOG = 1  0 - Asymptomatic (Fully active, able to carry on all predisease activities without restriction)  1 - Symptomatic but completely ambulatory (Restricted in physically strenuous activity but ambulatory and able to carry out work of a light or sedentary nature. For example, light housework,  office work)  2 - Symptomatic, <50% in bed during the day (Ambulatory and capable of all self care but unable to carry out any work activities. Up and about more than 50% of waking hours)  3 - Symptomatic, >50% in bed, but not bedbound (Capable of only limited self-care, confined to bed or chair 50% or more of waking hours)  4 - Bedbound (Completely disabled. Cannot carry on any self-care. Totally confined to bed or chair)  5 - Death   Eustace Pen MM, Creech RH, Tormey DC, et al. [phone]). "Toxicity and response criteria of the [facility]". [place] Oncol. 5 (6): 649-55  General: Well-developed, in no acute distress HEENT: Normocephalic, atraumatic; oral cavity clear Neck: Supple without any lymphadenopathy Cardiovascular: Regular rate and rhythm Respiratory: Clear to auscultation bilaterally GI: Soft, nontender, normal bowel sounds Extremities: No edema present Neuro: No focal deficits     LABORATORY DATA:  No results found for this basename: WBC, HGB, HCT, MCV, PLT   No results found for this basename: NA, K, CL, CO2   No results found for this basename: ALT, AST, GGT, ALKPHOS, BILITOT      RADIOGRAPHY: Mr Kizzie Fantasia Contrast  10/01/2013   CLINICAL DATA:  Lung mass.  BUN and creatinine were obtained on site at [place] at  [address]  Results:  BUN 6.0 mg/dL,  Creatinine 0.6 mg/dL.  EXAM: MRI HEAD WITHOUT AND WITH CONTRAST  TECHNIQUE: Multiplanar, multiecho pulse sequences of the brain and surrounding structures were obtained without and with intravenous contrast.  CONTRAST:  42mL MULTIHANCE GADOBENATE DIMEGLUMINE 529 MG/ML IV SOLN  COMPARISON:  None.  FINDINGS: In a by enhancing subcortical lesion in the high right parietal lobe measures 8 x 8.5 x 9.5 mm. No other focal enhancing lesions are present. Scattered periventricular and subcortical white matter changes are otherwise symmetric. No acute infarct or hemorrhage is present. The ventricles  are of normal size. No significant extra-axial fluid collection is present. No focal lesion within the left parietal skull is not expanded bone that measures 8 mm. Restricted diffusion is evident within this lesion. Although there is no definite enhancement, this raises concern for an osseous metastasis.  Flow is present in the major intracranial arteries. The globes and orbits are intact. A polyp or mucous retention cyst is present in the right sphenoid sinus. There is opacification of a single left ethmoid air cell. Mild mucosal thickening is noted along the anterior left frontal sinus and at the floor of the maxillary sinuses bilaterally, left greater than right.  IMPRESSION: 1. 8 x 8.5 x 9.5 mm ring-enhancing lesion in the right parietal lobe with surrounding edema is most concerning for a focal metastasis. 2. 8 mm lesion in the left parietal skull is also concerning for metastasis. This could represent a benign sclerotic lesion. No other definite skull lesions are evident. 3. Mild atrophy and diffuse white matter disease. This likely reflects the sequelae of chronic microvascular ischemia.   Electronically Signed   By: Lawrence Santiago M.D.   On: 10/01/2013 12:30   Nm Bone Scan Whole Body  10/07/2013   CLINICAL DATA:  Lung cancer  EXAM:  NUCLEAR MEDICINE WHOLE BODY BONE SCAN  TECHNIQUE: Whole body anterior and posterior images were obtained approximately 3 hours after intravenous injection of radiopharmaceutical.  COMPARISON:  PET-CT 09/09/2013  RADIOPHARMACEUTICALS:  25.0 Technetium-99 MDP  FINDINGS: No areas of abnormal uptake are identified to suggest osseous metastatic disease.  IMPRESSION: Negative whole body bone scan for osseous metastatic disease.   Electronically Signed   By: Kalman Jewels M.D.   On: 10/07/2013 15:08   Nm Pet Image Initial (pi) Skull Base To Thigh  09/09/2013   CLINICAL DATA:  Initial treatment strategy for Lung cancer.  EXAM: NUCLEAR MEDICINE PET SKULL BASE TO THIGH  FASTING BLOOD  GLUCOSE:  Value: 102mg /dl  TECHNIQUE: 19.0 mCi F-18 FDG was injected intravenously. CT data was obtained and used for attenuation correction and anatomic localization only. (This was not acquired as a diagnostic CT examination.) Additional exam technical data entered on technologist worksheet.  COMPARISON:  CT chest 12/ 22/14  FINDINGS: NECK  No hypermetabolic lymph nodes in the neck.  CHEST  There is no pleural effusion identified. Right upper lobe lung mass has a maximum diameter measuring 3.9 cm, image 64. The SUV max associated with this mass is equal to 16.7, image 64. There is surrounding ground-glass attenuation and interstitial thickening. If the patient has had a recent biopsy within this area this may reflect post biopsy hemorrhage. A similar appearance may be seen with lymphangitic spread of tumor. There is no hypermetabolic mediastinal or hilar lymph nodes. No hypermetabolic axillary or supraclavicular adenopathy identified.  ABDOMEN/PELVIS  No abnormal hypermetabolic activity within the liver, pancreas, adrenal glands, or spleen. No hypermetabolic lymph nodes in the abdomen or pelvis. Colonic diverticulosis identified. There is a focal segment of proximal transverse colon which exhibits abnormal wall edema/thickening. This is best seen on image 163 of series 2 and is of on certain clinical significance. There is no significant FDG uptake identified within this segment of thickened bowel.  SKELETON  No focal hypermetabolic activity to suggest skeletal metastasis.  IMPRESSION: 1. Right upper lobe lung mass has a maximum diameter of 3.9 cm and is consistent with primary lung neoplasm. 2. Ground-glass attenuation and interstitial thickening is noted within the surrounding a right upper lobe. In the setting of recent biopsy these findings may reflect areas of post biopsy hemorrhage. Alternatively, this may be seen with lymphangitic spread of tumor. 3. No evidence for hypermetabolic metastases.    Electronically Signed   By: Kerby Moors M.D.   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 She has a solitary brain metastasis in addition to an uncertain skull lesion, potentially a metastasis. The patient had her case discussed in multidisciplinary thoracic conference this morning and further workup was recommended including a bone scan to further characterize the skull lesion. The appropriate approach to the thoracic disease may depend on this, potentially surgical resection versus a less aggressive approach. The patient does appear to be a good candidate for radiosurgery to the solitary subcentimeter brain metastasis and I discussed this with the patient today.  I discussed with the patient the rationale and potential benefit from a course of radiosurgery. We also discussed the possible side effects and risks of treatment. All of her questions were answered. The patient does wish to proceed with this treatment.   PLAN: The patient will be scheduled for a simulation such that we can proceed with treatment  planning for radiosurgery. She also will be scheduled for evaluation by neurosurgery. We will followup on her results from the bone scan. This will help clarify the appropriate role for potential additional treatment to the possible skull metastasis as well as the appropriate local treatment for the patient's thoracic tumor.    I spent 60 minutes face to face with the patient and more than 50% of that time was spent in counseling and/or coordination of care.    ________________________________   Jodelle Gross, MD, [name]

## 2013-10-08 NOTE — Progress Notes (Signed)
Forwarded FMLA paperwork to RN for completion.

## 2013-10-09 ENCOUNTER — Ambulatory Visit
Admission: RE | Admit: 2013-10-09 | Discharge: 2013-10-09 | Disposition: A | Discharge status: Home / Self Care | Payer: BC Managed Care – PPO | Source: Ambulatory Visit | Attending: Radiation Oncology | Admitting: Radiation Oncology

## 2013-10-09 VITALS — BP 140/80 | HR 80 | Temp 98.0°F | Ht 64.5 in | Wt 111.3 lb

## 2013-10-09 DIAGNOSIS — C7931 Secondary malignant neoplasm of brain: Secondary | ICD-10-CM | POA: Insufficient documentation

## 2013-10-09 DIAGNOSIS — C7949 Secondary malignant neoplasm of other parts of nervous system: Secondary | ICD-10-CM

## 2013-10-09 DIAGNOSIS — C3490 Malignant neoplasm of unspecified part of unspecified bronchus or lung: Secondary | ICD-10-CM | POA: Insufficient documentation

## 2013-10-09 DIAGNOSIS — Z510 Encounter for antineoplastic radiation therapy: Secondary | ICD-10-CM | POA: Insufficient documentation

## 2013-10-09 MED ORDER — SODIUM CHLORIDE 0.9 % IJ SOLN
10.0000 mL | Freq: Once | INTRAMUSCULAR | Status: AC
  Administered 2013-10-09: 10 mL via INTRAVENOUS

## 2013-10-09 NOTE — Progress Notes (Signed)
Inserted #22 gauge IV into patient's right AC.  Blood return noted.  Secured with tegaderm and tape.  Patient tolerated well.

## 2013-10-16 ENCOUNTER — Ambulatory Visit
Admission: RE | Admit: 2013-10-16 | Discharge: 2013-10-16 | Disposition: A | Discharge status: Home / Self Care | Payer: BC Managed Care – PPO | Source: Ambulatory Visit | Attending: Radiation Oncology | Admitting: Radiation Oncology

## 2013-10-16 ENCOUNTER — Encounter: Payer: Self-pay | Admitting: Radiation Oncology

## 2013-10-16 VITALS — BP 104/57 | HR 69 | Temp 97.3°F | Resp 16

## 2013-10-16 DIAGNOSIS — C7949 Secondary malignant neoplasm of other parts of nervous system: Principal | ICD-10-CM

## 2013-10-16 DIAGNOSIS — C7931 Secondary malignant neoplasm of brain: Secondary | ICD-10-CM

## 2013-10-16 NOTE — Addendum Note (Signed)
Encounter addended by: Marye Round, MD on: 10/16/2013  5:48 PM<BR>     Documentation filed: Clinical Notes, Notes Section

## 2013-10-16 NOTE — Progress Notes (Signed)
  Radiation Oncology         [phone] ________________________________  Name: Alexis [name] MRN: [id number]  Date: 10/16/2013  DOB: 05-Aug-1951   SPECIAL TREATMENT PROCEDURE   3D TREATMENT PLANNING AND DOSIMETRY: The patient's radiation plan was reviewed and approved by Dr. Kathyrn Sheriff from neurosurgery and radiation oncology prior to treatment. It showed 3-dimensional radiation distributions overlaid onto the planning CT/MRI image set. The [facility] for the target structures as well as the organs at risk were reviewed. The documentation of the 3D plan and dosimetry are filed in the radiation oncology EMR.   NARRATIVE: The patient was brought to the TrueBeam stereotactic radiation treatment machine and placed supine on the CT couch. The head frame was applied, and the patient was set up for stereotactic radiosurgery. Neurosurgery was present for the set-up and delivery   SIMULATION VERIFICATION: In the couch zero-angle position, the patient underwent Exactrac imaging using the Brainlab system with orthogonal KV images. These were carefully aligned and repeated to confirm treatment position for each of the isocenters. The Exactrac snap film verification was repeated at each couch angle.   SPECIAL TREATMENT PROCEDURE: The patient received stereotactic radiosurgery to the following targets:  Rt Parietal 27mm target was treated using 3 Arcs to a prescription dose of 20 Gy. ExacTrac Snap verification was performed for each couch angle.   STEREOTACTIC TREATMENT MANAGEMENT: Following delivery, the patient was transported to nursing in stable condition and monitored for possible acute effects. Vital signs were recorded . The patient tolerated treatment without significant acute effects, and was discharged to home in stable condition.  PLAN: Follow-up in one month.   ------------------------------------------------  Jodelle Gross, MD, [name]

## 2013-10-16 NOTE — Progress Notes (Signed)
Vitals taken post SRS brain, patient in good spirits, vitals wnl, no c/o pain, headache,nausea, or dizziness stated or noted, offered warm blanket, cola,graham crackers and peanut butter, coffee given to spouse, monitor 1 hour Recheck vitals 1450pm 1:01 PM 2nd set vitals taken 1:44pm wnl, no c/o pain, nausea,headache,fever,no dizziness, pateitnt stated"I'm doing great", offerred coffee again,, patient d/c home via ambulatory no driving today, will go home and rest will call for any unusual symptoms if they occur stated patient 1:45 PM

## 2013-10-16 NOTE — Progress Notes (Signed)
  Radiation Oncology         [phone] ________________________________  Name: Alexis [name] MRN: [id number]  Date: 10/09/2013  DOB: 02-26-1951  SIMULATION AND TREATMENT PLANNING NOTE  DIAGNOSIS:  Metastatic lung cancer with brain metastasis  NARRATIVE:  The patient was brought to the [place] suite.  Identity was confirmed.  All relevant records and images related to the planned course of therapy were reviewed.  The patient freely provided informed written consent to proceed with treatment after reviewing the details related to the planned course of therapy. The consent form was witnessed and verified by the simulation staff. Intravenous access was established for contrast administration. Then, the patient was set-up in a stable reproducible supine position for radiation therapy.  A relocatable thermoplastic stereotactic head frame was fabricated for precise immobilization.  CT images were obtained.  Surface markings were placed.  The CT images were loaded into the planning software and fused with the patient's targeting MRI scan.  Then the target and avoidance structures were contoured.  Treatment planning then occurred.  The radiation prescription was entered and confirmed.  I have requested 3D planning  I have requested a DVH of the following structures: Brain stem, brain, left eye, right I, lenses, optic chiasm, target volumes, uninvolved brain, and normal tissue.    PLAN:  The patient will receive 20 Gy in 1 fraction to the right parietal intracranial lesion.  ________________________________  Jodelle Gross, MD, [name]

## 2013-10-16 NOTE — Progress Notes (Signed)
  Radiation Oncology         [phone] ________________________________  Name: Alexis [name] MRN: [id number]  Date: 10/16/2013  DOB: July 17, 1951  End of Treatment Note  Diagnosis:   Metastatic lung cancer with brain metastasis     Indication for treatment:  palliative       Radiation treatment dates:   10/16/2013  Site/dose:    Rt Parietal 86mm target was treated using 3 Arcs to a prescription dose of 20 Gy. ExacTrac Snap verification was performed for each couch angle.   Narrative: The patient tolerated radiation treatment relatively well.   She exhibited no sings of acute toxicity.  Plan: The patient will be scheduled for simulation for treatment planning for thoracic radiotherapy, anticipated to be a preoperative course of radiation with chemotherapy.  ________________________________  Jodelle Gross, M.D., Ph.D.

## 2013-10-16 NOTE — Patient Instructions (Signed)
Take decadron 1 tablet x2 per day for 3 days, then 1 tablet x1 per day for 5 days, then 1/2 tablet x1 per day for 5 days.

## 2013-10-17 ENCOUNTER — Ambulatory Visit
Admission: RE | Admit: 2013-10-17 | Discharge: 2013-10-17 | Disposition: A | Discharge status: Home / Self Care | Payer: BC Managed Care – PPO | Source: Ambulatory Visit | Attending: Radiation Oncology | Admitting: Radiation Oncology

## 2013-10-17 DIAGNOSIS — B370 Candidal stomatitis: Secondary | ICD-10-CM | POA: Insufficient documentation

## 2013-10-17 DIAGNOSIS — Z510 Encounter for antineoplastic radiation therapy: Secondary | ICD-10-CM | POA: Insufficient documentation

## 2013-10-17 DIAGNOSIS — C7949 Secondary malignant neoplasm of other parts of nervous system: Secondary | ICD-10-CM

## 2013-10-17 DIAGNOSIS — Z79899 Other long term (current) drug therapy: Secondary | ICD-10-CM | POA: Insufficient documentation

## 2013-10-17 DIAGNOSIS — J029 Acute pharyngitis, unspecified: Secondary | ICD-10-CM | POA: Insufficient documentation

## 2013-10-17 DIAGNOSIS — R110 Nausea: Secondary | ICD-10-CM | POA: Insufficient documentation

## 2013-10-17 DIAGNOSIS — Z933 Colostomy status: Secondary | ICD-10-CM | POA: Insufficient documentation

## 2013-10-17 DIAGNOSIS — C3410 Malignant neoplasm of upper lobe, unspecified bronchus or lung: Secondary | ICD-10-CM | POA: Insufficient documentation

## 2013-10-17 DIAGNOSIS — C7931 Secondary malignant neoplasm of brain: Secondary | ICD-10-CM | POA: Insufficient documentation

## 2013-10-17 NOTE — Addendum Note (Signed)
Encounter addended by: Consuella Lose, MD on: 10/17/2013  7:58 AM<BR>     Documentation filed: Notes Section

## 2013-10-17 NOTE — Op Note (Signed)
Name: Alexis [name]  MRN: [id number]  Date: 10/16/2013   DOB: October 16, 1950  STEREOTACTIC RADIOSURGERY OPERATIVE NOTE  PRE-OPERATIVE DIAGNOSIS:  Right Parietal Brain Met  POST-OPERATIVE DIAGNOSIS:  Same  PROCEDURE:  Stereotactic Radiosurgery  SURGEON:  Consuella Lose, MD  RADIATION ONCOLOGIST: Kyung Rudd, MD  TECHNIQUE:  The patient underwent a radiation treatment planning session in the radiation oncology simulation suite under the care of the radiation oncology physician and physicist.  I participated closely in the radiation treatment planning afterwards. The patient underwent planning CT which was fused to 3T high resolution MRI with 1 mm axial slices.  These images were fused on the planning system.  We contoured the gross target volumes and subsequently expanded this to yield the Planning Target Volume. I actively participated in the planning process.  I helped to define and review the target contours and also the contours of the optic pathway, eyes, brainstem and selected nearby organs at risk.  All the dose constraints for critical structures were reviewed and compared to AAPM Task Group 101.  The prescription dose conformity was reviewed.  I approved the plan electronically.    Accordingly, Alexis [name]  was brought to the TrueBeam stereotactic radiation treatment linac and placed in the custom immobilization mask.  The patient was aligned according to the IR fiducial markers with BrainLab Exactrac, then orthogonal x-rays were used in ExacTrac with the 6DOF robotic table and the shifts were made to align the patient  Alexis [name] received stereotactic radiosurgery to a prescription dose of 20Gy uneventfully.    The detailed description of the procedure is recorded in the radiation oncology procedure note.  I was present for the duration of the procedure.  DISPOSITION:   Following delivery, the patient was transported to nursing in stable condition and monitored for possible acute  effects to be discharged to home in stable condition with follow-up in one month.  Consuella Lose, MD [facility]

## 2013-10-21 ENCOUNTER — Encounter (HOSPITAL_COMMUNITY): Payer: BC Managed Care – PPO

## 2013-10-28 ENCOUNTER — Ambulatory Visit
Admission: RE | Admit: 2013-10-28 | Discharge: 2013-10-28 | Disposition: A | Discharge status: Home / Self Care | Payer: BC Managed Care – PPO | Source: Ambulatory Visit | Attending: Radiation Oncology | Admitting: Radiation Oncology

## 2013-10-29 ENCOUNTER — Ambulatory Visit
Admission: RE | Admit: 2013-10-29 | Discharge: 2013-10-29 | Disposition: A | Discharge status: Home / Self Care | Payer: BC Managed Care – PPO | Source: Ambulatory Visit | Attending: Radiation Oncology | Admitting: Radiation Oncology

## 2013-10-30 ENCOUNTER — Ambulatory Visit
Admission: RE | Admit: 2013-10-30 | Discharge: 2013-10-30 | Disposition: A | Discharge status: Home / Self Care | Payer: BC Managed Care – PPO | Source: Ambulatory Visit | Attending: Radiation Oncology | Admitting: Radiation Oncology

## 2013-10-31 ENCOUNTER — Other Ambulatory Visit: Payer: Self-pay | Admitting: Radiation Oncology

## 2013-10-31 ENCOUNTER — Telehealth: Payer: Self-pay | Admitting: *Deleted

## 2013-10-31 ENCOUNTER — Ambulatory Visit: Payer: BC Managed Care – PPO

## 2013-10-31 MED ORDER — ONDANSETRON HCL 8 MG PO TABS: 8.0000 mg | ORAL_TABLET | Freq: Three times a day (TID) | ORAL | Status: DC | PRN

## 2013-10-31 NOTE — Telephone Encounter (Signed)
Returned call to patient, sh stated she has been throwing up since 8pm last night and can't keep anything down, c/o stomach pain, weakness,chills, no nausea medication on file, also husband having same symptoms stated, sounds like  Stomach virus will e-mail Dr.Moody and will call her back afet speaking with him, she said she is still taking her decadron abut her tongue is dry and flaky., she uses [facility] on [address], advised her to  , take sips ginger ale,sprite and saltine crackers, chicken broth ,she said she will try and will wait on call back 9:34 AM'

## 2013-10-31 NOTE — Telephone Encounter (Signed)
Per Dr.Moody he is e-scribing medication for nausea for patient spoke with ms.Yip, informed her once nuasea medication is on board to start pushing fluids, she has no fever MD will look at her tongue/throat tomorrow ,patient said to thank MD and qquick return call  10:00 AM

## 2013-11-01 ENCOUNTER — Ambulatory Visit
Admission: RE | Admit: 2013-11-01 | Discharge: 2013-11-01 | Disposition: A | Discharge status: Home / Self Care | Payer: BC Managed Care – PPO | Source: Ambulatory Visit | Attending: Radiation Oncology | Admitting: Radiation Oncology

## 2013-11-01 ENCOUNTER — Encounter: Payer: Self-pay | Admitting: Radiation Oncology

## 2013-11-01 VITALS — BP 112/69 | HR 98 | Temp 98.6°F | Resp 20 | Wt 115.1 lb

## 2013-11-01 DIAGNOSIS — C7949 Secondary malignant neoplasm of other parts of nervous system: Principal | ICD-10-CM

## 2013-11-01 DIAGNOSIS — C3411 Malignant neoplasm of upper lobe, right bronchus or lung: Secondary | ICD-10-CM | POA: Insufficient documentation

## 2013-11-01 DIAGNOSIS — C7931 Secondary malignant neoplasm of brain: Secondary | ICD-10-CM

## 2013-11-01 DIAGNOSIS — C3410 Malignant neoplasm of upper lobe, unspecified bronchus or lung: Secondary | ICD-10-CM

## 2013-11-01 MED ORDER — BIAFINE EX EMUL: CUTANEOUS | Status: DC | PRN

## 2013-11-01 MED ORDER — FLUCONAZOLE 100 MG PO TABS: 100.0000 mg | ORAL_TABLET | Freq: Every day | ORAL | Status: DC

## 2013-11-01 NOTE — Progress Notes (Addendum)
Weekly rad txs 4  Rt  Lung, completed, biafine cream given, rad book  No skin changes on neck/chest , zofran helped her nausea stated, has started pushing fluids,  Ginger ale, does have thrush on tongue,  Discussed side efects, skin irritation,diet,increase protein, sdtart with brat diet today, chicken soup, pain, throat changes, fatigue, sob, taste changes,  Pain,  Business card given, Teach back  Given, sore throat, weak but better 11:46 AM

## 2013-11-01 NOTE — Progress Notes (Addendum)
   Department of Radiation Oncology  Phone:  [phone] Fax:        [phone]  Weekly Treatment Note    Name: Alexis [name] Date: 11/01/2013 MRN: [id number] DOB: [birth year]   Current dose: 8 Gy  Current fraction: 4   MEDICATIONS: Current Outpatient Prescriptions  Medication Sig Dispense Refill  . emollient (BIAFINE) cream Apply 1 application topically daily. Apply daily after rad tx      . ALPRAZolam (XANAX) 0.5 MG tablet One four times a day as needed for nerves or pain  120 tablet  0  . dexamethasone (DECADRON) 4 MG tablet Take 1 tablet (4 mg total) by mouth 2 (two) times daily with a meal.  60 tablet  0  . fluconazole (DIFLUCAN) 100 MG tablet Take 1 tablet (100 mg total) by mouth daily. Take 2 tabs x 1, then 1 tab QD x 9 additional days.  11 tablet  0  . HYDROcodone-acetaminophen (NORCO) 10-325 MG per tablet Take 1 tablet by mouth every 4 (four) hours as needed.  40 tablet  0  . ondansetron (ZOFRAN) 8 MG tablet Take 1 tablet (8 mg total) by mouth every 8 (eight) hours as needed for nausea or vomiting.  30 tablet  0   Current Facility-Administered Medications  Medication Dose Route Frequency Provider Last Rate Last Dose  . topical emolient (BIAFINE) emulsion   Topical PRN Marye Round, MD         ALLERGIES: Codeine   LABORATORY DATA:  No results found for this basename: WBC, HGB, HCT, MCV, PLT   No results found for this basename: NA, K, CL, CO2   No results found for this basename: ALT, AST, GGT, ALKPHOS, BILITOT     NARRATIVE: Alexis [name] was seen today for weekly treatment management. The chart was checked and the patient's films were reviewed. The patient states that she is doing relatively well with her treatment at this point. She does complain of some sore throat. She was noted by nursing to have some thrush present.  PHYSICAL EXAMINATION: weight is 115 lb 1.6 oz (52.209 kg). Her oral temperature is 98.6 F (37 C). Her blood pressure is 112/69 and  her pulse is 98. Her respiration is 20 and oxygen saturation is 99%.      thrush is present within the oral cavity ASSESSMENT: The patient is doing satisfactorily with treatment.  PLAN: We will continue with the patient's radiation treatment as planned. Prescription for Diflucan given.

## 2013-11-03 ENCOUNTER — Encounter (HOSPITAL_COMMUNITY): Payer: BC Managed Care – PPO | Admitting: Certified Registered"

## 2013-11-03 ENCOUNTER — Inpatient Hospital Stay (HOSPITAL_COMMUNITY)
Admission: EM | Admit: 2013-11-03 | Discharge: 2013-11-18 | DRG: 329 | Disposition: A | Discharge status: Home Health | Payer: BC Managed Care – PPO | Attending: General Surgery | Admitting: General Surgery

## 2013-11-03 ENCOUNTER — Emergency Department (HOSPITAL_COMMUNITY): Payer: BC Managed Care – PPO

## 2013-11-03 ENCOUNTER — Encounter (HOSPITAL_COMMUNITY): Payer: Self-pay | Admitting: Emergency Medicine

## 2013-11-03 ENCOUNTER — Emergency Department (HOSPITAL_COMMUNITY): Payer: BC Managed Care – PPO | Admitting: Certified Registered"

## 2013-11-03 ENCOUNTER — Encounter (HOSPITAL_COMMUNITY): Admission: EM | Disposition: A | Discharge status: Home Health | Payer: Self-pay | Source: Home / Self Care

## 2013-11-03 DIAGNOSIS — K631 Perforation of intestine (nontraumatic): Secondary | ICD-10-CM

## 2013-11-03 DIAGNOSIS — R5383 Other fatigue: Secondary | ICD-10-CM

## 2013-11-03 DIAGNOSIS — Z87891 Personal history of nicotine dependence: Secondary | ICD-10-CM

## 2013-11-03 DIAGNOSIS — R1084 Generalized abdominal pain: Secondary | ICD-10-CM | POA: Diagnosis present

## 2013-11-03 DIAGNOSIS — C7931 Secondary malignant neoplasm of brain: Secondary | ICD-10-CM | POA: Diagnosis present

## 2013-11-03 DIAGNOSIS — E43 Unspecified severe protein-calorie malnutrition: Secondary | ICD-10-CM | POA: Clinically undetermined

## 2013-11-03 DIAGNOSIS — K668 Other specified disorders of peritoneum: Secondary | ICD-10-CM | POA: Diagnosis present

## 2013-11-03 DIAGNOSIS — I10 Essential (primary) hypertension: Secondary | ICD-10-CM | POA: Diagnosis present

## 2013-11-03 DIAGNOSIS — N736 Female pelvic peritoneal adhesions (postinfective): Secondary | ICD-10-CM | POA: Diagnosis present

## 2013-11-03 DIAGNOSIS — Z801 Family history of malignant neoplasm of trachea, bronchus and lung: Secondary | ICD-10-CM

## 2013-11-03 DIAGNOSIS — R112 Nausea with vomiting, unspecified: Secondary | ICD-10-CM | POA: Diagnosis present

## 2013-11-03 DIAGNOSIS — IMO0002 Reserved for concepts with insufficient information to code with codable children: Secondary | ICD-10-CM

## 2013-11-03 DIAGNOSIS — K651 Peritoneal abscess: Secondary | ICD-10-CM

## 2013-11-03 DIAGNOSIS — K630 Abscess of intestine: Secondary | ICD-10-CM | POA: Diagnosis present

## 2013-11-03 DIAGNOSIS — K9189 Other postprocedural complications and disorders of digestive system: Secondary | ICD-10-CM | POA: Diagnosis not present

## 2013-11-03 DIAGNOSIS — R143 Flatulence: Secondary | ICD-10-CM

## 2013-11-03 DIAGNOSIS — J449 Chronic obstructive pulmonary disease, unspecified: Secondary | ICD-10-CM

## 2013-11-03 DIAGNOSIS — J4489 Other specified chronic obstructive pulmonary disease: Secondary | ICD-10-CM | POA: Diagnosis present

## 2013-11-03 DIAGNOSIS — Z8249 Family history of ischemic heart disease and other diseases of the circulatory system: Secondary | ICD-10-CM

## 2013-11-03 DIAGNOSIS — K560 Paralytic ileus: Secondary | ICD-10-CM | POA: Diagnosis not present

## 2013-11-03 DIAGNOSIS — D638 Anemia in other chronic diseases classified elsewhere: Secondary | ICD-10-CM | POA: Diagnosis present

## 2013-11-03 DIAGNOSIS — D649 Anemia, unspecified: Secondary | ICD-10-CM | POA: Diagnosis present

## 2013-11-03 DIAGNOSIS — R197 Diarrhea, unspecified: Secondary | ICD-10-CM | POA: Diagnosis present

## 2013-11-03 DIAGNOSIS — R141 Gas pain: Secondary | ICD-10-CM | POA: Diagnosis present

## 2013-11-03 DIAGNOSIS — R918 Other nonspecific abnormal finding of lung field: Secondary | ICD-10-CM

## 2013-11-03 DIAGNOSIS — J189 Pneumonia, unspecified organism: Secondary | ICD-10-CM | POA: Diagnosis not present

## 2013-11-03 DIAGNOSIS — R142 Eructation: Secondary | ICD-10-CM | POA: Diagnosis present

## 2013-11-03 DIAGNOSIS — D72829 Elevated white blood cell count, unspecified: Secondary | ICD-10-CM | POA: Diagnosis present

## 2013-11-03 DIAGNOSIS — R000 Tachycardia, unspecified: Secondary | ICD-10-CM | POA: Diagnosis present

## 2013-11-03 DIAGNOSIS — C3490 Malignant neoplasm of unspecified part of unspecified bronchus or lung: Secondary | ICD-10-CM | POA: Diagnosis present

## 2013-11-03 DIAGNOSIS — J9819 Other pulmonary collapse: Secondary | ICD-10-CM | POA: Diagnosis not present

## 2013-11-03 DIAGNOSIS — K567 Ileus, unspecified: Secondary | ICD-10-CM | POA: Diagnosis not present

## 2013-11-03 DIAGNOSIS — R5381 Other malaise: Secondary | ICD-10-CM | POA: Diagnosis present

## 2013-11-03 DIAGNOSIS — K5732 Diverticulitis of large intestine without perforation or abscess without bleeding: Secondary | ICD-10-CM

## 2013-11-03 DIAGNOSIS — C7949 Secondary malignant neoplasm of other parts of nervous system: Secondary | ICD-10-CM

## 2013-11-03 DIAGNOSIS — K929 Disease of digestive system, unspecified: Secondary | ICD-10-CM | POA: Diagnosis not present

## 2013-11-03 HISTORY — PX: LAPAROTOMY: SHX154

## 2013-11-03 LAB — COMPREHENSIVE METABOLIC PANEL
ALT: 20 U/L (ref 0–35)
AST: 17 U/L (ref 0–37)
Albumin: 2.4 g/dL — ABNORMAL LOW (ref 3.5–5.2)
Alkaline Phosphatase: 104 U/L (ref 39–117)
BUN: 20 mg/dL (ref 6–23)
CO2: 24 mEq/L (ref 19–32)
Calcium: 9.4 mg/dL (ref 8.4–10.5)
Chloride: 91 mEq/L — ABNORMAL LOW (ref 96–112)
Creatinine, Ser: 0.58 mg/dL (ref 0.50–1.10)
GFR calc Af Amer: >90 mL/min (ref >90)
GFR calc non Af Amer: >90 mL/min (ref >90)
Glucose, Bld: 120 mg/dL — ABNORMAL HIGH (ref 70–99)
Potassium: 3.5 mEq/L — ABNORMAL LOW (ref 3.7–5.3)
Sodium: 132 mEq/L — ABNORMAL LOW (ref 137–147)
Total Bilirubin: 0.8 mg/dL (ref 0.3–1.2)
Total Protein: 7.1 g/dL (ref 6.0–8.3)

## 2013-11-03 LAB — TYPE AND SCREEN
ABO/RH(D): A POS
Antibody Screen: NEGATIVE

## 2013-11-03 LAB — CBC WITH DIFFERENTIAL/PLATELET
Basophils Absolute: 0 10*3/uL (ref 0.0–0.1)
Basophils Relative: 0 % (ref 0–1)
Eosinophils Absolute: 0.1 10*3/uL (ref 0.0–0.7)
Eosinophils Relative: 1 % (ref 0–5)
HCT: 37.3 % (ref 36.0–46.0)
Hemoglobin: 12.8 g/dL (ref 12.0–15.0)
Lymphocytes Relative: 7 % — ABNORMAL LOW (ref 12–46)
Lymphs Abs: 1 10*3/uL (ref 0.7–4.0)
MCH: 31.8 pg (ref 26.0–34.0)
MCHC: 34.3 g/dL (ref 30.0–36.0)
MCV: 92.8 fL (ref 78.0–100.0)
Monocytes Absolute: 1.6 10*3/uL — ABNORMAL HIGH (ref 0.1–1.0)
Monocytes Relative: 11 % (ref 3–12)
Neutro Abs: 11.5 10*3/uL — ABNORMAL HIGH (ref 1.7–7.7)
Neutrophils Relative %: 81 % — ABNORMAL HIGH (ref 43–77)
Platelets: 324 10*3/uL (ref 150–400)
RBC: 4.02 MIL/uL (ref 3.87–5.11)
RDW: 15.7 % — ABNORMAL HIGH (ref 11.5–15.5)
WBC Morphology: INCREASED
WBC: 14.2 10*3/uL — ABNORMAL HIGH (ref 4.0–10.5)

## 2013-11-03 LAB — URINE MICROSCOPIC-ADD ON

## 2013-11-03 LAB — URINALYSIS, ROUTINE W REFLEX MICROSCOPIC
Glucose, UA: NEGATIVE mg/dL
Ketones, ur: 15 mg/dL — AB
Leukocytes, UA: NEGATIVE
Nitrite: NEGATIVE
Protein, ur: NEGATIVE mg/dL
Specific Gravity, Urine: 1.046 — ABNORMAL HIGH (ref 1.005–1.030)
Urobilinogen, UA: 1 mg/dL (ref 0.0–1.0)
pH: 6 (ref 5.0–8.0)

## 2013-11-03 LAB — LIPASE, BLOOD: Lipase: 8 U/L — ABNORMAL LOW (ref 11–59)

## 2013-11-03 LAB — ABO/RH: ABO/RH(D): A POS

## 2013-11-03 SURGERY — LAPAROTOMY, EXPLORATORY
Anesthesia: General | Site: Abdomen

## 2013-11-03 MED ORDER — HYDROMORPHONE HCL PF 2 MG/ML IJ SOLN
INTRAMUSCULAR | Status: AC
  Filled 2013-11-03: qty 1

## 2013-11-03 MED ORDER — HYDROMORPHONE HCL PF 1 MG/ML IJ SOLN
1.0000 mg | Freq: Once | INTRAMUSCULAR | Status: AC
  Administered 2013-11-03: 1 mg via INTRAVENOUS
  Filled 2013-11-03: qty 1

## 2013-11-03 MED ORDER — SODIUM CHLORIDE 0.9 % IJ SOLN
INTRAMUSCULAR | Status: AC
  Filled 2013-11-03: qty 10

## 2013-11-03 MED ORDER — 0.9 % SODIUM CHLORIDE (POUR BTL) OPTIME
TOPICAL | Status: DC | PRN
  Administered 2013-11-03: 7000 mL

## 2013-11-03 MED ORDER — HYDROMORPHONE HCL PF 1 MG/ML IJ SOLN
0.2500 mg | INTRAMUSCULAR | Status: DC | PRN
  Administered 2013-11-03 (×2): 0.5 mg via INTRAVENOUS

## 2013-11-03 MED ORDER — ROCURONIUM BROMIDE 100 MG/10ML IV SOLN
INTRAVENOUS | Status: AC
  Filled 2013-11-03: qty 1

## 2013-11-03 MED ORDER — PIPERACILLIN-TAZOBACTAM 3.375 G IVPB
3.3750 g | Freq: Three times a day (TID) | INTRAVENOUS | Status: DC
  Administered 2013-11-03 – 2013-11-17 (×42): 3.375 g via INTRAVENOUS
  Filled 2013-11-03 (×44): qty 50

## 2013-11-03 MED ORDER — PROPOFOL 10 MG/ML IV BOLUS
INTRAVENOUS | Status: AC
  Filled 2013-11-03: qty 20

## 2013-11-03 MED ORDER — PANTOPRAZOLE SODIUM 40 MG IV SOLR
40.0000 mg | INTRAVENOUS | Status: DC
  Administered 2013-11-03 – 2013-11-13 (×11): 40 mg via INTRAVENOUS
  Filled 2013-11-03 (×12): qty 40

## 2013-11-03 MED ORDER — DEXAMETHASONE SODIUM PHOSPHATE 10 MG/ML IJ SOLN
INTRAMUSCULAR | Status: AC
  Filled 2013-11-03: qty 1

## 2013-11-03 MED ORDER — GLYCOPYRROLATE 0.2 MG/ML IJ SOLN
INTRAMUSCULAR | Status: AC
  Filled 2013-11-03: qty 2

## 2013-11-03 MED ORDER — ONDANSETRON HCL 4 MG/2ML IJ SOLN
INTRAMUSCULAR | Status: DC | PRN
Start: 2013-11-03 — End: 2013-11-03
  Administered 2013-11-03: 4 mg via INTRAVENOUS

## 2013-11-03 MED ORDER — LIDOCAINE HCL (PF) 2 % IJ SOLN
INTRAMUSCULAR | Status: DC | PRN
  Administered 2013-11-03: 20 mg via INTRADERMAL

## 2013-11-03 MED ORDER — NEOSTIGMINE METHYLSULFATE 1 MG/ML IJ SOLN
INTRAMUSCULAR | Status: DC | PRN
  Administered 2013-11-03: 3 mg via INTRAVENOUS

## 2013-11-03 MED ORDER — ROCURONIUM BROMIDE 100 MG/10ML IV SOLN
INTRAVENOUS | Status: DC | PRN
Start: 2013-11-03 — End: 2013-11-03
  Administered 2013-11-03: 30 mg via INTRAVENOUS
  Administered 2013-11-03: 10 mg via INTRAVENOUS

## 2013-11-03 MED ORDER — SODIUM CHLORIDE 0.9 % IV BOLUS (SEPSIS)
1000.0000 mL | Freq: Once | INTRAVENOUS | Status: AC
  Administered 2013-11-03: 1000 mL via INTRAVENOUS

## 2013-11-03 MED ORDER — DIPHENHYDRAMINE HCL 12.5 MG/5ML PO ELIX: 12.5000 mg | ORAL_SOLUTION | Freq: Four times a day (QID) | ORAL | Status: DC | PRN

## 2013-11-03 MED ORDER — ONDANSETRON HCL 4 MG/2ML IJ SOLN
4.0000 mg | Freq: Once | INTRAMUSCULAR | Status: AC
  Administered 2013-11-03: 4 mg via INTRAVENOUS
  Filled 2013-11-03: qty 2

## 2013-11-03 MED ORDER — GLYCOPYRROLATE 0.2 MG/ML IJ SOLN
INTRAMUSCULAR | Status: DC | PRN
  Administered 2013-11-03: 0.4 mg via INTRAVENOUS

## 2013-11-03 MED ORDER — FENTANYL CITRATE 0.05 MG/ML IJ SOLN
INTRAMUSCULAR | Status: AC
  Filled 2013-11-03: qty 5

## 2013-11-03 MED ORDER — PROPOFOL 10 MG/ML IV BOLUS
INTRAVENOUS | Status: DC | PRN
  Administered 2013-11-03: 120 mg via INTRAVENOUS

## 2013-11-03 MED ORDER — EPHEDRINE SULFATE 50 MG/ML IJ SOLN
INTRAMUSCULAR | Status: DC | PRN
  Administered 2013-11-03: 5 mg via INTRAVENOUS

## 2013-11-03 MED ORDER — DEXAMETHASONE SODIUM PHOSPHATE 10 MG/ML IJ SOLN
INTRAMUSCULAR | Status: DC | PRN
  Administered 2013-11-03: 10 mg via INTRAVENOUS

## 2013-11-03 MED ORDER — LACTATED RINGERS IV SOLN
INTRAVENOUS | Status: DC
Start: 2013-11-03 — End: 2013-11-03
  Administered 2013-11-03: 11:00:00 via INTRAVENOUS

## 2013-11-03 MED ORDER — SODIUM CHLORIDE 0.9 % IJ SOLN: 9.0000 mL | INTRAMUSCULAR | Status: DC | PRN

## 2013-11-03 MED ORDER — ONDANSETRON HCL 4 MG PO TABS: 4.0000 mg | ORAL_TABLET | Freq: Four times a day (QID) | ORAL | Status: DC | PRN

## 2013-11-03 MED ORDER — EPHEDRINE SULFATE 50 MG/ML IJ SOLN
INTRAMUSCULAR | Status: AC
  Filled 2013-11-03: qty 1

## 2013-11-03 MED ORDER — KCL-LACTATED RINGERS-D5W 20 MEQ/L IV SOLN
INTRAVENOUS | Status: AC
  Administered 2013-11-03 – 2013-11-04 (×3): via INTRAVENOUS
  Administered 2013-11-04: 125 mL via INTRAVENOUS
  Administered 2013-11-04 – 2013-11-06 (×6): via INTRAVENOUS
  Filled 2013-11-03 (×19): qty 1000

## 2013-11-03 MED ORDER — IOHEXOL 300 MG/ML  SOLN
50.0000 mL | Freq: Once | INTRAMUSCULAR | Status: AC | PRN
  Administered 2013-11-03: 50 mL via ORAL

## 2013-11-03 MED ORDER — SODIUM CHLORIDE 0.9 % IV SOLN
INTRAVENOUS | Status: DC | PRN
  Administered 2013-11-03: 09:00:00 via INTRAVENOUS

## 2013-11-03 MED ORDER — HEPARIN SODIUM (PORCINE) 5000 UNIT/ML IJ SOLN
5000.0000 [IU] | Freq: Three times a day (TID) | INTRAMUSCULAR | Status: DC
  Administered 2013-11-04 – 2013-11-18 (×42): 5000 [IU] via SUBCUTANEOUS
  Filled 2013-11-03 (×47): qty 1

## 2013-11-03 MED ORDER — DIPHENHYDRAMINE HCL 50 MG/ML IJ SOLN: 12.5000 mg | Freq: Four times a day (QID) | INTRAMUSCULAR | Status: DC | PRN

## 2013-11-03 MED ORDER — FENTANYL CITRATE 0.05 MG/ML IJ SOLN
INTRAMUSCULAR | Status: DC | PRN
  Administered 2013-11-03: 50 ug via INTRAVENOUS
  Administered 2013-11-03: 100 ug via INTRAVENOUS
  Administered 2013-11-03 (×2): 50 ug via INTRAVENOUS

## 2013-11-03 MED ORDER — MORPHINE SULFATE (PF) 1 MG/ML IV SOLN
INTRAVENOUS | Status: DC
  Administered 2013-11-03: 23.62 mg via INTRAVENOUS
  Administered 2013-11-03 (×2): via INTRAVENOUS
  Administered 2013-11-03: 7.5 mg via INTRAVENOUS
  Administered 2013-11-04: 15.47 mg via INTRAVENOUS
  Administered 2013-11-04: 10:00:00 via INTRAVENOUS
  Administered 2013-11-04: 16.5 mg via INTRAVENOUS
  Administered 2013-11-04: 7.5 mg via INTRAVENOUS
  Administered 2013-11-04: 19:00:00 via INTRAVENOUS
  Administered 2013-11-04: 10.5 mg via INTRAVENOUS
  Administered 2013-11-04: 12 mg via INTRAVENOUS
  Administered 2013-11-05: 9 mg via INTRAVENOUS
  Administered 2013-11-05: 30 mg via INTRAVENOUS
  Administered 2013-11-05: 8.83 mg via INTRAVENOUS
  Administered 2013-11-05 (×2): 12 mg via INTRAVENOUS
  Administered 2013-11-05 (×2): via INTRAVENOUS
  Administered 2013-11-05: 10.5 mg via INTRAVENOUS
  Administered 2013-11-06: 7.5 mg via INTRAVENOUS
  Administered 2013-11-06: 12 mg via INTRAVENOUS
  Administered 2013-11-06: 13.5 mg via INTRAVENOUS
  Administered 2013-11-06: 09:00:00 via INTRAVENOUS
  Administered 2013-11-06: 4.5 mg via INTRAVENOUS
  Administered 2013-11-06: 10.5 mg via INTRAVENOUS
  Administered 2013-11-07: 11.73 mg via INTRAVENOUS
  Administered 2013-11-07: 9 mg via INTRAVENOUS
  Administered 2013-11-07: 10.23 mg via INTRAVENOUS
  Administered 2013-11-07: 14.5 mg via INTRAVENOUS
  Administered 2013-11-07: 6 mg via INTRAVENOUS
  Administered 2013-11-07: 10.5 mg via INTRAVENOUS
  Administered 2013-11-07: 12:00:00 via INTRAVENOUS
  Administered 2013-11-07: 3.7 mg via INTRAVENOUS
  Administered 2013-11-08: 10.5 mg via INTRAVENOUS
  Administered 2013-11-08: 15:00:00 via INTRAVENOUS
  Administered 2013-11-08 (×2): 3 mg via INTRAVENOUS
  Administered 2013-11-08: 8.49 mg via INTRAVENOUS
  Administered 2013-11-08: 12 mg via INTRAVENOUS
  Administered 2013-11-08: 4.5 mg via INTRAVENOUS
  Administered 2013-11-09: 5.4 mg via INTRAVENOUS
  Administered 2013-11-09 (×2): via INTRAVENOUS
  Administered 2013-11-09 (×2): 6 mg via INTRAVENOUS
  Administered 2013-11-09: 9 mg via INTRAVENOUS
  Administered 2013-11-10: 7.5 mg via INTRAVENOUS
  Administered 2013-11-10: 19.5 mg via INTRAVENOUS
  Administered 2013-11-10: 6 mg via INTRAVENOUS
  Administered 2013-11-10: 14.77 mg via INTRAVENOUS
  Administered 2013-11-10: 10:00:00 via INTRAVENOUS
  Administered 2013-11-10: 7.5 mg via INTRAVENOUS
  Administered 2013-11-10: 17:00:00 via INTRAVENOUS
  Administered 2013-11-11: 8.39 mg via INTRAVENOUS
  Administered 2013-11-11: 4.3 mg via INTRAVENOUS
  Administered 2013-11-11: 18:00:00 via INTRAVENOUS
  Administered 2013-11-11: 10.5 mg via INTRAVENOUS
  Administered 2013-11-11: 02:00:00 via INTRAVENOUS
  Administered 2013-11-11: 10.5 mg via INTRAVENOUS
  Administered 2013-11-11: 13.5 mg via INTRAVENOUS
  Administered 2013-11-11: 09:00:00 via INTRAVENOUS
  Administered 2013-11-11: 10.5 mg via INTRAVENOUS
  Administered 2013-11-11: 9 mg via INTRAVENOUS
  Administered 2013-11-12: 17.47 mg via INTRAVENOUS
  Administered 2013-11-12: 9 mg via INTRAVENOUS
  Administered 2013-11-12: 12 mg via INTRAVENOUS
  Administered 2013-11-12 (×3): via INTRAVENOUS
  Administered 2013-11-12: 6 mg via INTRAVENOUS
  Administered 2013-11-12: 16.5 mg via INTRAVENOUS
  Administered 2013-11-13: 6 mg via INTRAVENOUS
  Administered 2013-11-13: 8.85 mg via INTRAVENOUS
  Administered 2013-11-13: 7.2 mg via INTRAVENOUS
  Administered 2013-11-13: 02:00:00 via INTRAVENOUS
  Filled 2013-11-03 (×24): qty 25

## 2013-11-03 MED ORDER — ONDANSETRON HCL 4 MG/2ML IJ SOLN: 4.0000 mg | Freq: Four times a day (QID) | INTRAMUSCULAR | Status: DC | PRN

## 2013-11-03 MED ORDER — SUCCINYLCHOLINE CHLORIDE 20 MG/ML IJ SOLN
INTRAMUSCULAR | Status: DC | PRN
  Administered 2013-11-03: 80 mg via INTRAVENOUS

## 2013-11-03 MED ORDER — HYDROMORPHONE HCL PF 1 MG/ML IJ SOLN
INTRAMUSCULAR | Status: DC | PRN
  Administered 2013-11-03 (×2): 1 mg via INTRAVENOUS

## 2013-11-03 MED ORDER — MORPHINE SULFATE (PF) 1 MG/ML IV SOLN
INTRAVENOUS | Status: AC
  Filled 2013-11-03: qty 25

## 2013-11-03 MED ORDER — SUCCINYLCHOLINE CHLORIDE 20 MG/ML IJ SOLN
INTRAMUSCULAR | Status: AC
  Filled 2013-11-03: qty 1

## 2013-11-03 MED ORDER — MIDAZOLAM HCL 2 MG/2ML IJ SOLN
INTRAMUSCULAR | Status: AC
  Filled 2013-11-03: qty 2

## 2013-11-03 MED ORDER — PIPERACILLIN-TAZOBACTAM 3.375 G IVPB
3.3750 g | Freq: Three times a day (TID) | INTRAVENOUS | Status: DC
  Administered 2013-11-03: 3.375 g via INTRAVENOUS
  Filled 2013-11-03 (×3): qty 50

## 2013-11-03 MED ORDER — MIDAZOLAM HCL 5 MG/5ML IJ SOLN
INTRAMUSCULAR | Status: DC | PRN
  Administered 2013-11-03: 2 mg via INTRAVENOUS

## 2013-11-03 MED ORDER — IOHEXOL 300 MG/ML  SOLN
80.0000 mL | Freq: Once | INTRAMUSCULAR | Status: AC | PRN
  Administered 2013-11-03: 80 mL via INTRAVENOUS

## 2013-11-03 MED ORDER — ONDANSETRON HCL 4 MG/2ML IJ SOLN
INTRAMUSCULAR | Status: AC
  Filled 2013-11-03: qty 2

## 2013-11-03 MED ORDER — NALOXONE HCL 0.4 MG/ML IJ SOLN: 0.4000 mg | INTRAMUSCULAR | Status: DC | PRN

## 2013-11-03 MED ORDER — HYDROMORPHONE HCL PF 1 MG/ML IJ SOLN
INTRAMUSCULAR | Status: AC
  Filled 2013-11-03: qty 1

## 2013-11-03 MED ORDER — SODIUM CHLORIDE 0.9 % IV SOLN: INTRAVENOUS | Status: DC

## 2013-11-03 SURGICAL SUPPLY — 45 items
APPLICATOR COTTON TIP 6IN STRL (MISCELLANEOUS) IMPLANT
BLADE EXTENDED COATED 6.5IN (ELECTRODE) ×2 IMPLANT
BLADE HEX COATED 2.75 (ELECTRODE) ×2 IMPLANT
CANISTER SUCTION 2500CC (MISCELLANEOUS) ×2 IMPLANT
COVER MAYO STAND STRL (DRAPES) ×2 IMPLANT
DRAIN CHANNEL 19F RND (DRAIN) ×2 IMPLANT
DRAPE LAPAROSCOPIC ABDOMINAL (DRAPES) ×2 IMPLANT
DRAPE UTILITY XL STRL (DRAPES) ×2 IMPLANT
DRAPE WARM FLUID 44X44 (DRAPE) ×2 IMPLANT
DRSG PAD ABDOMINAL 8X10 ST (GAUZE/BANDAGES/DRESSINGS) ×2 IMPLANT
ELECT BLADE TIP CTD 4 INCH (ELECTRODE) ×2 IMPLANT
ELECT REM PT RETURN 9FT ADLT (ELECTROSURGICAL) ×2
ELECTRODE REM PT RTRN 9FT ADLT (ELECTROSURGICAL) ×1 IMPLANT
EVACUATOR DRAINAGE 10X20 100CC (DRAIN) IMPLANT
EVACUATOR SILICONE 100CC (DRAIN) ×2 IMPLANT
GLOVE ECLIPSE 8.0 STRL XLNG CF (GLOVE) ×2 IMPLANT
GLOVE INDICATOR 8.0 STRL GRN (GLOVE) ×4 IMPLANT
GOWN STRL REUS W/TWL XL LVL3 (GOWN DISPOSABLE) ×4 IMPLANT
KIT BASIN OR (CUSTOM PROCEDURE TRAY) ×2 IMPLANT
LIGASURE IMPACT 36 18CM CVD LR (INSTRUMENTS) ×2 IMPLANT
NS IRRIG 1000ML POUR BTL (IV SOLUTION) IMPLANT
PACK GENERAL/GYN (CUSTOM PROCEDURE TRAY) ×2 IMPLANT
SPONGE GAUZE 4X4 12PLY (GAUZE/BANDAGES/DRESSINGS) ×2 IMPLANT
SPONGE LAP 18X18 X RAY DECT (DISPOSABLE) ×4 IMPLANT
STAPLER CUT CVD 40MM GREEN (STAPLE) ×2 IMPLANT
STAPLER CUT RELOAD BLUE (STAPLE) ×4 IMPLANT
STAPLER VISISTAT 35W (STAPLE) ×2 IMPLANT
SUCTION POOLE TIP (SUCTIONS) ×2 IMPLANT
SUT ETHILON 3 0 PS 1 (SUTURE) ×2 IMPLANT
SUT PDS AB 1 CTX 36 (SUTURE) IMPLANT
SUT PDS AB 1 TP1 96 (SUTURE) ×6 IMPLANT
SUT SILK 2 0 (SUTURE) ×1
SUT SILK 2 0 SH CR/8 (SUTURE) ×2 IMPLANT
SUT SILK 2-0 18XBRD TIE 12 (SUTURE) ×1 IMPLANT
SUT SILK 3 0 (SUTURE) ×1
SUT SILK 3 0 SH CR/8 (SUTURE) ×2 IMPLANT
SUT SILK 3-0 18XBRD TIE 12 (SUTURE) ×1 IMPLANT
SUT VIC AB 2-0 SH 18 (SUTURE) ×2 IMPLANT
SUT VIC AB 3-0 SH 18 (SUTURE) IMPLANT
SUT VIC AB 3-0 SH 8-18 (SUTURE) ×2 IMPLANT
SUT VICRYL 2 0 18  UND BR (SUTURE)
SUT VICRYL 2 0 18 UND BR (SUTURE) IMPLANT
TOWEL OR 17X26 10 PK STRL BLUE (TOWEL DISPOSABLE) ×4 IMPLANT
TOWEL OR NON WOVEN STRL DISP B (DISPOSABLE) ×2 IMPLANT
TRAY FOLEY CATH 14FRSI W/METER (CATHETERS) ×2 IMPLANT

## 2013-11-03 NOTE — ED Notes (Signed)
I went into the pt's room to offer toileting and the pt was throwing up. The nurse was notified and the pt stated that she would like to wait a little while to use the bathroom.

## 2013-11-03 NOTE — Op Note (Signed)
Operative Note  Alexis [name] female 63 y.o. 11/03/2013  PREOPERATIVE DX:   Perforated viscus with multiple intra-abdominal abscesses  POSTOPERATIVE DX:  Perforated sigmoid colon with multiple intra-abdominal abscesses  PROCEDURE:  Emergency exploratory laparotomy, drainage of intra-abdominal abscesses, mobilization of splenic flexure, sigmoid colectomy, colostomy.         Surgeon: Odis Hollingshead   Assistants: Autumn Messing, M.D.  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 She is undergoing radiation therapy. She developed abdominal pain 3 days ago that progressively worsened. She also had distention. She presented to the emergency department and CT scan was consistent with a pneumoperitoneum as well as multiple interbowel abscesses and thickened sigmoid colon. She is now brought to the operating room for the above emergency procedure.    Procedure Detail:  She was brought to the operating room placed supine on the operating table and general anesthetic was given. A Foley catheter was inserted. A nasogastric tube was inserted. The abdominal wall was rightly sterilely prepped and draped.  A midline incision was made beginning above the umbilicus and ascending down to the pubis sharply and using electrocautery. Once the peritoneal cavity was entered there is evacuation of some air. Purulent fluid was noted. There were omental adhesions to the left-sided abdominal wall and down to the area of the pelvis.  These were freed using the LigaSure and electrocautery. I then noted small bowel densely adherent down to the pelvis and was able to manually freed this up. An abscess cavity was noted and drained. The multiple interloop abscess cavities during the small intestine and its mesentery. These were all opened up and drained. The small intestine was somewhat indurated and inflamed. There  is a very firm sigmoid colon present with induration. There is an abscess cavity lateral to it that I was able to enter and drained purulent material from. I then called carefully mobilized the sigmoid colon by dividing its lateral attachments. The left ureter was identified. I got down below the rectosigmoid junction were normal rectum appeared. I made a mesenteric defect close to the rectal area. I then divided this with a linear cutting stapler. I divided the mesentery close to the colon all the way up to the descending colon sigmoid junction were  Normal appearing colon was present. I then divided the colon at this point with a linear cutting stapler. The inferior specimen was marked with a suture and passed off the field.  I then further mobilized the descending colon by dividing its lateral attachments. Using electrocautery I dissected omentum free from the proximal descending colon and transverse colon. I mobilized the splenic flexure.  The abdominal cavity was then copiously irrigated out with saline solution. The entire small bowel was inspected and there was no evidence of perforation. Hemostasis was adequate at this time. A stab was made in the left lower quadrant area. A size 19 Blake drain was then placed into the pelvis and anchored to the skin with 3-0 nylon suture. A circular full-thickness skin incision was then made in the left midabdomen. This was carried down to the anterior rectus sheath. I made a cruciate incision in the rectus sheath anteriorly and posteriorly. The peritoneum was divided. This was dilated to 2 fingers. The descending colon stump was brought out through this incision with no tension. I did an anterior colopexy in 4 quadrants to the anterior fascia with 2-0 Vicryl suture.  Following this,  the fascia was closed with a running double looped #1 PDS suture. The subcutaneous tissue was packed with saline moistened gauze. The drain was hooked up to bulb suction.  Next I  addressed the colostomy site. I excised part of the descending colon and noted to be somewhat dusky. I released the colopexy sutures and this improved the blood supply and the colon started to bleed some. I then matured the colostomy with interrupted 3-0 Vicryl sutures. A stomal appliance was applied. A dry dressing was applied to the midline wound and dressing site. The skin was left open.  She tolerated the procedure well without any apparent complications.  She was taken to the recovery room in satisfactory condition.   Estimated Blood Loss:  600 ml         Drains: #19 Blake drain  Blood Given: none          Specimens: Sigmoid colon        Complications:  * No complications entered in OR log *         Disposition: PACU - hemodynamically stable.         Condition: stable

## 2013-11-03 NOTE — Transfer of Care (Signed)
Immediate Anesthesia Transfer of Care Note  Patient: Alexis [name]  Procedure(s) Performed: Procedure(s) (LRB): EXPLORATORY LAPAROTOMY, DRAINAGE OF INTRA  ABDOMINAL ABSCESSES, MOBILIZATION OF SPLENIC FLEXURE, SIGMOID COLECTOMY WITH COLOSTOMY (N/A)  Patient Location: PACU  Anesthesia Type: General  Level of Consciousness: sedated, patient cooperative and responds to stimulation  Airway & Oxygen Therapy: Patient Spontanous Breathing and Patient connected to face mask oxgen  Post-op Assessment: Report given to PACU RN and Post -op Vital signs reviewed and stable  Post vital signs: Reviewed and stable  Complications: No apparent anesthesia complications

## 2013-11-03 NOTE — ED Notes (Signed)
Pt vomiting bright green bile,  Joaquim Lai PA aware,  Follow verbal order

## 2013-11-03 NOTE — ED Notes (Signed)
Pt states she feels better than when she first arrived,  Pt does have pink color now and has ambulated to bathroom without assistance

## 2013-11-03 NOTE — H&P (Signed)
Alexis [name] is an 63 y.o. female.   Chief Complaint: Severe abdominal pain HPI: This is a 63 year old female who developed abdominal pain with nausea vomiting and a small amount of diarrhea 3 days ago. Her husband had similar symptoms. However her pain worsened and persisted. She got progressive abdominal distention. She presented to the emergency department for evaluation. She underwent a CT scan of the abdomen which demonstrated pneumoperitoneum as well as thickening in the sigmoid colon area suggesting perforated sigmoid colon. There is also an impending bowel obstruction with dilated loops of bowel. There multiple interloop abscesses. I was asked to see her at this time. 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 She's been treated with radiation therapy at this time. PET scan did not demonstrate any intra-abdominal metastasis.  Past Medical History  Diagnosis Date  . Cancer     lung, hx cervicle    Past Surgical History  Procedure Laterality Date  . Video bronchoscopy Bilateral 08/30/2013    Procedure: VIDEO BRONCHOSCOPY WITH FLUORO;  Surgeon: Tanda Rockers, MD;  Location: [facility];  Service: Cardiopulmonary;  Laterality: Bilateral;  . Abdominal hysterectomy      Family History  Problem Relation Age of Onset  . Emphysema Father     smoked  . Lung cancer Father     smoked  . Cancer Mother   . Hypertension Mother   . COPD Mother    Social History:  reports that she quit smoking about 5 weeks ago. Her smoking use included Cigarettes. She has a 40 pack-year smoking history. She has never used smokeless tobacco. She reports that she does not drink alcohol or use illicit drugs.  Allergies:  Allergies  Allergen Reactions  . Codeine Nausea And Vomiting     (Not in a hospital admission)  Results for orders placed during the hospital encounter of 11/03/13 (from the past 48 hour(s))  CBC WITH DIFFERENTIAL     Status: Abnormal   Collection Time     11/03/13  3:04 AM      Result Value Ref Range   WBC 14.2 (*) 4.0 - 10.5 K/uL   RBC 4.02  3.87 - 5.11 MIL/uL   Hemoglobin 12.8  12.0 - 15.0 g/dL   HCT 37.3  36.0 - 46.0 %   MCV 92.8  78.0 - 100.0 fL   MCH 31.8  26.0 - 34.0 pg   MCHC 34.3  30.0 - 36.0 g/dL   RDW 15.7 (*) 11.5 - 15.5 %   Platelets 324  150 - 400 K/uL   Neutrophils Relative % 81 (*) 43 - 77 %   Lymphocytes Relative 7 (*) 12 - 46 %   Monocytes Relative 11  3 - 12 %   Eosinophils Relative 1  0 - 5 %   Basophils Relative 0  0 - 1 %   Neutro Abs 11.5 (*) 1.7 - 7.7 K/uL   Lymphs Abs 1.0  0.7 - 4.0 K/uL   Monocytes Absolute 1.6 (*) 0.1 - 1.0 K/uL   Eosinophils Absolute 0.1  0.0 - 0.7 K/uL   Basophils Absolute 0.0  0.0 - 0.1 K/uL   WBC Morphology INCREASED BANDS (>20% BANDS)     Comment: MILD LEFT SHIFT (1-5% METAS, OCC MYELO, OCC BANDS)     VACUOLATED NEUTROPHILS     TOXIC GRANULATION   Smear Review PLATELET CLUMPS NOTED ON SMEAR     Comment: LARGE PLATELETS PRESENT     GIANT PLATELETS SEEN  COMPREHENSIVE METABOLIC PANEL     Status: Abnormal   Collection Time    11/03/13  3:04 AM      Result Value Ref Range   Sodium 132 (*) 137 - 147 mEq/L   Potassium 3.5 (*) 3.7 - 5.3 mEq/L   Chloride 91 (*) 96 - 112 mEq/L   CO2 24  19 - 32 mEq/L   Glucose, Bld 120 (*) 70 - 99 mg/dL   BUN 20  6 - 23 mg/dL   Creatinine, Ser 0.58  0.50 - 1.10 mg/dL   Calcium 9.4  8.4 - 10.5 mg/dL   Total Protein 7.1  6.0 - 8.3 g/dL   Albumin 2.4 (*) 3.5 - 5.2 g/dL   AST 17  0 - 37 U/L   ALT 20  0 - 35 U/L   Alkaline Phosphatase 104  39 - 117 U/L   Total Bilirubin 0.8  0.3 - 1.2 mg/dL   GFR calc non Af Amer >90  >90 mL/min   GFR calc Af Amer >90  >90 mL/min   Comment: (NOTE)     The eGFR has been calculated using the CKD EPI equation.     This calculation has not been validated in all clinical situations.     eGFR's persistently <90 mL/min signify possible Chronic Kidney     Disease.  LIPASE, BLOOD     Status: Abnormal   Collection Time     11/03/13  3:04 AM      Result Value Ref Range   Lipase 8 (*) 11 - 59 U/L  URINALYSIS, ROUTINE W REFLEX MICROSCOPIC     Status: Abnormal   Collection Time    11/03/13  6:07 AM      Result Value Ref Range   Color, Urine AMBER (*) YELLOW   Comment: BIOCHEMICALS MAY BE AFFECTED BY COLOR   APPearance CLOUDY (*) CLEAR   Specific Gravity, Urine 1.046 (*) 1.005 - 1.030   pH 6.0  5.0 - 8.0   Glucose, UA NEGATIVE  NEGATIVE mg/dL   Hgb urine dipstick LARGE (*) NEGATIVE   Bilirubin Urine SMALL (*) NEGATIVE   Ketones, ur 15 (*) NEGATIVE mg/dL   Protein, ur NEGATIVE  NEGATIVE mg/dL   Urobilinogen, UA 1.0  0.0 - 1.0 mg/dL   Nitrite NEGATIVE  NEGATIVE   Leukocytes, UA NEGATIVE  NEGATIVE  URINE MICROSCOPIC-ADD ON     Status: Abnormal   Collection Time    11/03/13  6:07 AM      Result Value Ref Range   Squamous Epithelial / LPF RARE  RARE   RBC / HPF 7-10  <3 RBC/hpf   Bacteria, UA RARE  RARE   Casts HYALINE CASTS (*) NEGATIVE   Urine-Other MUCOUS PRESENT     Ct Abdomen Pelvis W Contrast  11/03/2013   CLINICAL DATA:  Abdominal pain.  Lung cancer on treatment.  EXAM: CT ABDOMEN AND PELVIS WITH CONTRAST  TECHNIQUE: Multidetector CT imaging of the abdomen and pelvis was performed using the standard protocol following bolus administration of intravenous contrast.  CONTRAST:  69m OMNIPAQUE IOHEXOL 300 MG/ML SOLN, 860mOMNIPAQUE IOHEXOL 300 MG/ML SOLN  COMPARISON:  PET-CT 09/09/2013  FINDINGS: BODY WALL: Unremarkable.  LOWER CHEST: Lower periesophageal lymphadenopathy which do not appear hypermetabolic on previous CT.  ABDOMEN/PELVIS:  Liver: No focal abnormality.  Biliary: No evidence of biliary obstruction or stone.  Pancreas: Unremarkable.  Spleen: Unremarkable.  Adrenals: Unremarkable.  Kidneys and ureters: No hydronephrosis or stone.  Bladder: Unremarkable.  Reproductive:  Hysterectomy.  Bowel: There is diverticulosis in the distal colon, with sigmoid colonic thickening likely from diverticulitis. Along  the left lateral wall of the sigmoid colon, there is a gas and fluid containing collection (abscess) which is either pericolonic or intramural, measuring 3 cm. More medially, in the peritoneal space, there are multiple pockets of gas and fluid, with surrounding peritoneal thickening. One of the larger, just medial to the colon, measures 5 cm in length. There are at least 3 other discrete collections. There is free perforation, with pneumoperitoneum seen at the hepatic hilum. There is extensive bowel abnormality in the right lower quadrant, with hyperenhancing and thickened loops of small bowel in the region of inflammation. Proximally, there is relative obstruction, with bowel dilatation and fluid levels. No necrotic segment of small bowel identified. No pericecal inflammation.  Retroperitoneum: No mass or adenopathy.  Peritoneum: Fluid collections as above.  Vascular: No acute abnormality.  OSSEOUS: No acute abnormalities.  Critical Value/emergent results were called by telephone at the time of interpretation on 11/03/2013 at 5:55 AM to Dr. Ignacia Felling , who verbally acknowledged these results.  IMPRESSION: 1. Bowel perforation with pneumoperitoneum and multiple developing peritoneal abscesses (at least 4, up to 5 cm in length). Sigmoid diverticulitis is the likely cause, although most of the fluid collections reside between inflamed small bowel loops. 2. Small bowel obstruction secondary to the above.   Electronically Signed   By: Jorje Guild M.D.   On: 11/03/2013 05:58    Review of Systems  Constitutional: Positive for fever, chills and weight loss.  Respiratory: Negative for shortness of breath.   Cardiovascular: Negative for chest pain.  Gastrointestinal: Positive for nausea, vomiting, abdominal pain and diarrhea. Negative for blood in stool.  Genitourinary: Negative for dysuria and hematuria.  Neurological: Negative for seizures.  Endo/Heme/Allergies: Negative.     Blood pressure 114/48,  pulse 95, temperature 98.7 F (37.1 C), temperature source Oral, resp. rate 16, height 5' 4"  (1.626 m), weight 115 lb (52.164 kg), SpO2 90.00%. Physical Exam  Constitutional: No distress.  Cachectic female who currently is in no acute distress.  HENT:  Head: Normocephalic and atraumatic.  Eyes: EOM are normal. No scleral icterus.  Neck: Neck supple.  Cardiovascular: Normal rate and regular rhythm.   Respiratory: Effort normal.  Breath sounds are distant but clear. There is tattooing in the right upper chest wall and right neck area.  GI: There is tenderness. There is guarding.  Firm and distended. Diffuse tenderness especially in the lower quadrants. Hypoactive bowel sounds. Small subumbilical scar.  Musculoskeletal: She exhibits no edema.  Lymphadenopathy:    She has no cervical adenopathy.  Neurological: She is alert.  Skin: Skin is warm and dry.  Psychiatric: She has a normal mood and affect. Her behavior is normal.     Assessment/Plan Pneumoperitoneum secondary to bowel perforation, most likely sigmoid colon. In pending small bowel structure likely secondary to inflammatory process versus primary small bowel perforation. Also has multiple interloop and intra-abdominal abscesses. She does have metastatic lung cancer.  She has a good functional status currently.  Plan: Emergency exploratory laparotomy with bowel resection and possible colostomy. Intravenous antibiotics.  I have explained the procedure and risks of the surgery.  Risks include but are not limited to bleeding, infection, wound problems, anesthesia, anastomotic leak, need for colostomy, injury to intraabominal organs (such as intestine, spleen, kidney, bladder, ureter, etc.), need for repeat operaton, ileus, irregular bowel habits.  She and her husband seem to understand and would  like to proceed.  Mele Sylvester J 11/03/2013, 7:37 AM

## 2013-11-03 NOTE — Preoperative (Signed)
Beta Blockers   Reason not to administer Beta Blockers:Not Applicable 

## 2013-11-03 NOTE — Anesthesia Preprocedure Evaluation (Addendum)
Anesthesia Evaluation  Patient identified by MRN, date of birth, ID band Patient awake    Reviewed: Allergy & Precautions, H&P , NPO status , Patient's Chart, lab work & pertinent test results  Airway Mallampati: II TM Distance: >3 FB Neck ROM: full    Dental no notable dental hx. 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 40 py former smoker. breath sounds clear to auscultation  Pulmonary exam normal       Cardiovascular negative cardio ROS  Rhythm:regular Rate:Normal     Neuro/Psych negative neurological ROS  negative psych ROS   GI/Hepatic negative GI ROS, Neg liver ROS,   Endo/Other  negative endocrine ROS  Renal/GU negative Renal ROS  negative genitourinary   Musculoskeletal   Abdominal   Peds  Hematology negative hematology ROS (+)   Anesthesia Other Findings   Reproductive/Obstetrics negative OB ROS                         Anesthesia Physical Anesthesia Plan  ASA: IV and emergent  Anesthesia Plan: General   Post-op Pain Management:    Induction: Rapid sequence, Cricoid pressure planned and Intravenous  Airway Management Planned: Oral ETT  Additional Equipment:   Intra-op Plan:   Post-operative Plan: Possible Post-op intubation/ventilation  Informed Consent: I have reviewed the patients History and Physical, chart, labs and discussed the procedure including the risks, benefits and alternatives for the proposed anesthesia with the patient or authorized representative who has indicated his/her understanding and acceptance.   Dental Advisory Given  Plan Discussed with: CRNA and Surgeon  Anesthesia Plan Comments:        Anesthesia Quick Evaluation

## 2013-11-03 NOTE — ED Notes (Signed)
Pt stated she would like to wait a little while longer before attempting to urinate.

## 2013-11-03 NOTE — ED Provider Notes (Signed)
Discussed case with Wille Glaser, PA-C. Transfer of care from [facility], PA-C to this provider at change in shift.   Awaiting call for admission to Internal Medicine as per request by Dr Zella Richer, general surgeon regarding case. Previous provider has already spoken with general surgery and will be taking the patient to the OR.   8:02 AM This provider spoke with Dr. Wendee Beavers, [facility] regarding case, history, presentation, labs and imaging, as well as request of general surgeon. Hospitalist saw that this should be a surgery admission. No other Internal Medicine requirement. Dr. Wendee Beavers spoke with Dr. Marnette Burgess.  8:14 AM Dr. Marnette Burgess spoke with general surgery regarding what internal medicine reported about direct admit to the general surgery team. General surgery to speak with Internal Medicine.   Jamse Mead, PA-C 11/04/13 1225

## 2013-11-03 NOTE — ED Notes (Signed)
Pt c/o abd pain with n/v/d 3 days ago, family member has been vomiting as well. Pt is currently on radiation, began tx last week

## 2013-11-03 NOTE — Anesthesia Postprocedure Evaluation (Signed)
  Anesthesia Post-op Note  Patient: Alexis [name]  Procedure(s) Performed: Procedure(s) (LRB): EXPLORATORY LAPAROTOMY, DRAINAGE OF INTRA  ABDOMINAL ABSCESSES, MOBILIZATION OF SPLENIC FLEXURE, SIGMOID COLECTOMY WITH COLOSTOMY (N/A)  Patient Location: PACU  Anesthesia Type: General  Level of Consciousness: awake and alert   Airway and Oxygen Therapy: Patient Spontanous Breathing  Post-op Pain: mild  Post-op Assessment: Post-op Vital signs reviewed, Patient's Cardiovascular Status Stable, Respiratory Function Stable, Patent Airway and No signs of Nausea or vomiting  Last Vitals:  Filed Vitals:   11/03/13 1130  BP:   Pulse:   Temp: 36.1 C  Resp:     Post-op Vital Signs: stable   Complications: No apparent anesthesia complications

## 2013-11-03 NOTE — ED Provider Notes (Signed)
CSN: [id number]     Arrival date & time 11/03/13  0001 History   First MD Initiated Contact with Patient 11/03/13 [phone]     Chief Complaint  Patient presents with  . 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  She states that her husband felt better but that she also began to feel worse, she reports anorexia, bloating and constipation since this.  She has tried miralax to have a bowel movement.  She denies fever, chills, abdominal surgery besides abdominal hysterectomy.  Patient is a 63 y.o. female presenting with abdominal pain. The history is provided by the patient and the spouse. No language interpreter was used.  Abdominal Pain Pain location:  LLQ Pain quality: bloating, cramping and sharp   Pain radiates to:  Does not radiate Pain severity:  Severe Onset quality:  Gradual Duration:  4 days Timing:  Constant Progression:  Worsening Chronicity:  New Context: recent illness and sick contacts   Relieved by:  Nothing Worsened by:  Nothing tried Ineffective treatments:  None tried Associated symptoms: anorexia, constipation, fatigue and nausea   Associated symptoms: no belching, no diarrhea, no dysuria, no fever, no hematemesis, no hematochezia and no vomiting   Risk factors: has not had multiple surgeries     Past Medical History  Diagnosis Date  . Cancer     lung, hx cervicle   Past Surgical History  Procedure Laterality Date  . Video bronchoscopy Bilateral 08/30/2013    Procedure: VIDEO BRONCHOSCOPY WITH FLUORO;  Surgeon: Tanda Rockers, MD;  Location: [facility];  Service: Cardiopulmonary;   Laterality: Bilateral;  . Abdominal hysterectomy     Family History  Problem Relation Age of Onset  . Emphysema Father     smoked  . Lung cancer Father     smoked  . Cancer Mother   . Hypertension Mother   . COPD Mother    History  Substance Use Topics  . Smoking status: Former Smoker -- 1.00 packs/day for 40 years    Types: Cigarettes    Quit date: 09/27/2013  . Smokeless tobacco: Never Used  . Alcohol Use: No   OB History   Grav Para Term Preterm Abortions TAB SAB Ect Mult Living                 Review of Systems  Constitutional: Positive for fatigue. Negative for fever.  Gastrointestinal: Positive for nausea, abdominal pain, constipation and anorexia. Negative for vomiting, diarrhea, hematochezia and hematemesis.  Genitourinary: Negative for dysuria.  All other systems reviewed and are negative.      Allergies  Codeine  Home Medications   Current Outpatient Rx  Name  Route  Sig  Dispense  Refill  . ALPRAZolam (XANAX) 0.5 MG tablet      One four times a day as needed for nerves or pain   120 tablet   0   . emollient (BIAFINE) cream   Topical   Apply 1 application topically daily. Apply daily after rad tx         . fluconazole (DIFLUCAN) 100 MG tablet   Oral   Take 1 tablet (100 mg total) by mouth  daily. Take 2 tabs x 1, then 1 tab QD x 9 additional days.   11 tablet   0   . HYDROcodone-acetaminophen (NORCO) 10-325 MG per tablet   Oral   Take 1 tablet by mouth every 4 (four) hours as needed.   40 tablet   0   . omeprazole (PRILOSEC) 20 MG capsule   Oral   Take 20 mg by mouth daily.         . ondansetron (ZOFRAN) 8 MG tablet   Oral   Take 1 tablet (8 mg total) by mouth every 8 (eight) hours as needed for nausea or vomiting.   30 tablet   0    BP 114/48  Pulse 95  Temp(Src) 98.7 F (37.1 C) (Oral)  Resp 16  Ht 5\' 4"  (1.626 m)  Wt 115 lb (52.164 kg)  BMI 19.73 kg/m2  SpO2 90% Physical Exam  Nursing note and vitals  reviewed. Constitutional: She is oriented to person, place, and time. She appears well-developed and well-nourished. No distress.  HENT:  Head: Normocephalic and atraumatic.  Right Ear: External ear normal.  Left Ear: External ear normal.  Nose: Nose normal.  Mouth/Throat: Oropharynx is clear and moist. No oropharyngeal exudate.  Eyes: Conjunctivae are normal. Pupils are equal, round, and reactive to light. No scleral icterus.  Neck: Normal range of motion. Neck supple.  Cardiovascular: Regular rhythm and normal heart sounds.  Exam reveals no gallop and no friction rub.   No murmur heard. tachycardia  Pulmonary/Chest: Effort normal and breath sounds normal. No respiratory distress. She has no wheezes. She has no rales. She exhibits no tenderness.  Abdominal: Soft. Bowel sounds are normal. She exhibits distension. She exhibits no shifting dullness. There is tenderness. There is guarding. There is no rebound and no CVA tenderness.    Musculoskeletal: Normal range of motion. She exhibits no edema and no tenderness.  Lymphadenopathy:    She has no cervical adenopathy.  Neurological: She is alert and oriented to person, place, and time. She exhibits normal muscle tone. Coordination normal.  Skin: Skin is warm and dry. No rash noted. No erythema. No pallor.  Psychiatric: She has a normal mood and affect. Her behavior is normal. Judgment and thought content normal.    ED Course  Procedures (including critical care time) Labs Review Labs Reviewed  CBC WITH DIFFERENTIAL - Abnormal; Notable for the following:    WBC 14.2 (*)    RDW 15.7 (*)    Neutrophils Relative % 81 (*)    Lymphocytes Relative 7 (*)    Neutro Abs 11.5 (*)    Monocytes Absolute 1.6 (*)    All other components within normal limits  COMPREHENSIVE METABOLIC PANEL - Abnormal; Notable for the following:    Sodium 132 (*)    Potassium 3.5 (*)    Chloride 91 (*)    Glucose, Bld 120 (*)    Albumin 2.4 (*)    All other  components within normal limits  LIPASE, BLOOD - Abnormal; Notable for the following:    Lipase 8 (*)    All other components within normal limits  URINALYSIS, ROUTINE W REFLEX MICROSCOPIC  I-STAT CG4 LACTIC ACID, ED   Imaging Review Ct Abdomen Pelvis W Contrast  11/03/2013   CLINICAL DATA:  Abdominal pain.  Lung cancer on treatment.  EXAM: CT ABDOMEN AND PELVIS WITH CONTRAST  TECHNIQUE: Multidetector CT imaging of the abdomen and pelvis was performed using the standard protocol following bolus administration of intravenous  contrast.  CONTRAST:  58mL OMNIPAQUE IOHEXOL 300 MG/ML SOLN, 38mL OMNIPAQUE IOHEXOL 300 MG/ML SOLN  COMPARISON:  PET-CT 09/09/2013  FINDINGS: BODY WALL: Unremarkable.  LOWER CHEST: Lower periesophageal lymphadenopathy which do not appear hypermetabolic on previous CT.  ABDOMEN/PELVIS:  Liver: No focal abnormality.  Biliary: No evidence of biliary obstruction or stone.  Pancreas: Unremarkable.  Spleen: Unremarkable.  Adrenals: Unremarkable.  Kidneys and ureters: No hydronephrosis or stone.  Bladder: Unremarkable.  Reproductive: Hysterectomy.  Bowel: There is diverticulosis in the distal colon, with sigmoid colonic thickening likely from diverticulitis. Along the left lateral wall of the sigmoid colon, there is a gas and fluid containing collection (abscess) which is either pericolonic or intramural, measuring 3 cm. More medially, in the peritoneal space, there are multiple pockets of gas and fluid, with surrounding peritoneal thickening. One of the larger, just medial to the colon, measures 5 cm in length. There are at least 3 other discrete collections. There is free perforation, with pneumoperitoneum seen at the hepatic hilum. There is extensive bowel abnormality in the right lower quadrant, with hyperenhancing and thickened loops of small bowel in the region of inflammation. Proximally, there is relative obstruction, with bowel dilatation and fluid levels. No necrotic segment of small  bowel identified. No pericecal inflammation.  Retroperitoneum: No mass or adenopathy.  Peritoneum: Fluid collections as above.  Vascular: No acute abnormality.  OSSEOUS: No acute abnormalities.  Critical Value/emergent results were called by telephone at the time of interpretation on 11/03/2013 at 5:55 AM to Dr. Ignacia Felling , who verbally acknowledged these results.  IMPRESSION: 1. Bowel perforation with pneumoperitoneum and multiple developing peritoneal abscesses (at least 4, up to 5 cm in length). Sigmoid diverticulitis is the likely cause, although most of the fluid collections reside between inflamed small bowel loops. 2. Small bowel obstruction secondary to the above.   Electronically Signed   By: Jorje Guild M.D.   On: 11/03/2013 05:58     EKG Interpretation None      MDM   Perforated bowel with abscess formation.  Patient is hemodynamically stable, I have spoken with [facility] who will come in to see the patient.  I am awaiting [facility] to return my call to get the patient admitted to their service for her more complicated medical issues including the cancer.  Patient reports pain currently improved, I have discussed the results of the labs and CT scan with the patient and her husband and I have discussed the care and treatment of this patient with Dr. Marnette Burgess as well.  Disposition will be set per Marcie Bal, PA-C.   Idalia Needle Joelyn Oms, PA-C 11/09/13 0109

## 2013-11-04 ENCOUNTER — Encounter: Payer: Self-pay | Admitting: *Deleted

## 2013-11-04 ENCOUNTER — Encounter (HOSPITAL_COMMUNITY): Payer: Self-pay | Admitting: General Surgery

## 2013-11-04 ENCOUNTER — Ambulatory Visit: Payer: BC Managed Care – PPO

## 2013-11-04 LAB — CBC
HCT: 30.3 % — ABNORMAL LOW (ref 36.0–46.0)
Hemoglobin: 10.2 g/dL — ABNORMAL LOW (ref 12.0–15.0)
MCH: 31.5 pg (ref 26.0–34.0)
MCHC: 33.7 g/dL (ref 30.0–36.0)
MCV: 93.5 fL (ref 78.0–100.0)
Platelets: 292 10*3/uL (ref 150–400)
RBC: 3.24 MIL/uL — ABNORMAL LOW (ref 3.87–5.11)
RDW: 16.1 % — ABNORMAL HIGH (ref 11.5–15.5)
WBC: 8.5 10*3/uL (ref 4.0–10.5)

## 2013-11-04 LAB — BASIC METABOLIC PANEL
BUN: 22 mg/dL (ref 6–23)
CO2: 24 mEq/L (ref 19–32)
Calcium: 8.6 mg/dL (ref 8.4–10.5)
Chloride: 102 mEq/L (ref 96–112)
Creatinine, Ser: 0.53 mg/dL (ref 0.50–1.10)
GFR calc Af Amer: >90 mL/min (ref >90)
GFR calc non Af Amer: >90 mL/min (ref >90)
Glucose, Bld: 240 mg/dL — ABNORMAL HIGH (ref 70–99)
Potassium: 4.3 mEq/L (ref 3.7–5.3)
Sodium: 139 mEq/L (ref 137–147)

## 2013-11-04 NOTE — Progress Notes (Signed)
Patient ID: Alexis [name], female   DOB: 1950-12-12, 63 y.o.   MRN: [id number]  Subjective: Tachycardia getting better.  White count is down.    Objective:  Vital signs:  Filed Vitals:   11/04/13 0000 11/04/13 0400 11/04/13 0419 11/04/13 0605  BP: 103/80   110/53  Pulse: 120   90  Temp: 98.3 F (36.8 C) 98.1 F (36.7 C)    TempSrc: Oral Oral    Resp: 17  18 18   Height:      Weight:  119 lb 11.4 oz (54.3 kg)    SpO2: 98%  99% 97%    Last BM Date:  (PTA/pre-op)  Intake/Output   Yesterday:  03/08 0701 - 03/09 0700 In: 3980.8 [I.V.:3820.8; NG/GT:60; IV Piggyback:100] Out: 2220 [Urine:900; Emesis/NG output:270; Drains:450; Blood:600] This shift:    I/O last 3 completed shifts: In: 4105.8 [I.V.:3945.8; NG/GT:60; IV Piggyback:100] Out: 2220 [Urine:900; Emesis/NG output:270; Drains:450; Blood:600] Total I/O In: 125 [I.V.:125] Out: -    Physical Exam: General: Pt awake/alert/oriented x3 in no acute distress Chest: CTA No chest wall pain w good excursion CV:  Pulses intact.  Regular rhythm MS: Normal AROM mjr joints.  No obvious deformity Abdomen: Soft.  Nondistended. Hypoactive bowel sounds.  Midline dressing intact.  Drain with serosanguinous output.  Ostomy is purple tinge, but viable with serosanguinous output. Ext:  SCDs BLE.  No mjr edema.  No cyanosis Skin: No petechiae / purpura   Problem List:   Active Problems:   Pneumoperitoneum   Perforation of sigmoid colon    Results:   Labs: Results for orders placed during the hospital encounter of 11/03/13 (from the past 48 hour(s))  CBC WITH DIFFERENTIAL     Status: Abnormal   Collection Time    11/03/13  3:04 AM      Result Value Ref Range   WBC 14.2 (*) 4.0 - 10.5 K/uL   RBC 4.02  3.87 - 5.11 MIL/uL   Hemoglobin 12.8  12.0 - 15.0 g/dL   HCT 37.3  36.0 - 46.0 %   MCV 92.8  78.0 - 100.0 fL   MCH 31.8  26.0 - 34.0 pg   MCHC 34.3  30.0 - 36.0 g/dL   RDW 15.7 (*) 11.5 - 15.5 %   Platelets 324  150 - 400  K/uL   Neutrophils Relative % 81 (*) 43 - 77 %   Lymphocytes Relative 7 (*) 12 - 46 %   Monocytes Relative 11  3 - 12 %   Eosinophils Relative 1  0 - 5 %   Basophils Relative 0  0 - 1 %   Neutro Abs 11.5 (*) 1.7 - 7.7 K/uL   Lymphs Abs 1.0  0.7 - 4.0 K/uL   Monocytes Absolute 1.6 (*) 0.1 - 1.0 K/uL   Eosinophils Absolute 0.1  0.0 - 0.7 K/uL   Basophils Absolute 0.0  0.0 - 0.1 K/uL   WBC Morphology INCREASED BANDS (>20% BANDS)     Comment: MILD LEFT SHIFT (1-5% METAS, OCC MYELO, OCC BANDS)     VACUOLATED NEUTROPHILS     TOXIC GRANULATION   Smear Review PLATELET CLUMPS NOTED ON SMEAR     Comment: LARGE PLATELETS PRESENT     GIANT PLATELETS SEEN  COMPREHENSIVE METABOLIC PANEL     Status: Abnormal   Collection Time    11/03/13  3:04 AM      Result Value Ref Range   Sodium 132 (*) 137 - 147 mEq/L   Potassium 3.5 (*)  3.7 - 5.3 mEq/L   Chloride 91 (*) 96 - 112 mEq/L   CO2 24  19 - 32 mEq/L   Glucose, Bld 120 (*) 70 - 99 mg/dL   BUN 20  6 - 23 mg/dL   Creatinine, Ser 0.58  0.50 - 1.10 mg/dL   Calcium 9.4  8.4 - 10.5 mg/dL   Total Protein 7.1  6.0 - 8.3 g/dL   Albumin 2.4 (*) 3.5 - 5.2 g/dL   AST 17  0 - 37 U/L   ALT 20  0 - 35 U/L   Alkaline Phosphatase 104  39 - 117 U/L   Total Bilirubin 0.8  0.3 - 1.2 mg/dL   GFR calc non Af Amer >90  >90 mL/min   GFR calc Af Amer >90  >90 mL/min   Comment: (NOTE)     The eGFR has been calculated using the CKD EPI equation.     This calculation has not been validated in all clinical situations.     eGFR's persistently <90 mL/min signify possible Chronic Kidney     Disease.  LIPASE, BLOOD     Status: Abnormal   Collection Time    11/03/13  3:04 AM      Result Value Ref Range   Lipase 8 (*) 11 - 59 U/L  URINALYSIS, ROUTINE W REFLEX MICROSCOPIC     Status: Abnormal   Collection Time    11/03/13  6:07 AM      Result Value Ref Range   Color, Urine AMBER (*) YELLOW   Comment: BIOCHEMICALS MAY BE AFFECTED BY COLOR   APPearance CLOUDY (*)  CLEAR   Specific Gravity, Urine 1.046 (*) 1.005 - 1.030   pH 6.0  5.0 - 8.0   Glucose, UA NEGATIVE  NEGATIVE mg/dL   Hgb urine dipstick LARGE (*) NEGATIVE   Bilirubin Urine SMALL (*) NEGATIVE   Ketones, ur 15 (*) NEGATIVE mg/dL   Protein, ur NEGATIVE  NEGATIVE mg/dL   Urobilinogen, UA 1.0  0.0 - 1.0 mg/dL   Nitrite NEGATIVE  NEGATIVE   Leukocytes, UA NEGATIVE  NEGATIVE  URINE MICROSCOPIC-ADD ON     Status: Abnormal   Collection Time    11/03/13  6:07 AM      Result Value Ref Range   Squamous Epithelial / LPF RARE  RARE   RBC / HPF 7-10  <3 RBC/hpf   Bacteria, UA RARE  RARE   Casts HYALINE CASTS (*) NEGATIVE   Urine-Other MUCOUS PRESENT    TYPE AND SCREEN     Status: None   Collection Time    11/03/13  7:09 AM      Result Value Ref Range   ABO/RH(D) A POS     Antibody Screen NEG     Sample Expiration 11/06/2013    ABO/RH     Status: None   Collection Time    11/03/13  7:09 AM      Result Value Ref Range   ABO/RH(D) A POS    BASIC METABOLIC PANEL     Status: Abnormal   Collection Time    11/04/13  3:33 AM      Result Value Ref Range   Sodium 139  137 - 147 mEq/L   Comment: DELTA CHECK NOTED     REPEATED TO VERIFY   Potassium 4.3  3.7 - 5.3 mEq/L   Comment: DELTA CHECK NOTED     REPEATED TO VERIFY     NO VISIBLE HEMOLYSIS   Chloride 102  96 - 112 mEq/L   Comment: DELTA CHECK NOTED     REPEATED TO VERIFY   CO2 24  19 - 32 mEq/L   Glucose, Bld 240 (*) 70 - 99 mg/dL   BUN 22  6 - 23 mg/dL   Creatinine, Ser 0.53  0.50 - 1.10 mg/dL   Calcium 8.6  8.4 - 10.5 mg/dL   GFR calc non Af Amer >90  >90 mL/min   GFR calc Af Amer >90  >90 mL/min   Comment: (NOTE)     The eGFR has been calculated using the CKD EPI equation.     This calculation has not been validated in all clinical situations.     eGFR's persistently <90 mL/min signify possible Chronic Kidney     Disease.  CBC     Status: Abnormal   Collection Time    11/04/13  3:33 AM      Result Value Ref Range   WBC 8.5   4.0 - 10.5 K/uL   RBC 3.24 (*) 3.87 - 5.11 MIL/uL   Hemoglobin 10.2 (*) 12.0 - 15.0 g/dL   Comment: DELTA CHECK NOTED     REPEATED TO VERIFY   HCT 30.3 (*) 36.0 - 46.0 %   MCV 93.5  78.0 - 100.0 fL   MCH 31.5  26.0 - 34.0 pg   MCHC 33.7  30.0 - 36.0 g/dL   RDW 16.1 (*) 11.5 - 15.5 %   Platelets 292  150 - 400 K/uL    Imaging / Studies: Ct Abdomen Pelvis W Contrast  11/03/2013   CLINICAL DATA:  Abdominal pain.  Lung cancer on treatment.  EXAM: CT ABDOMEN AND PELVIS WITH CONTRAST  TECHNIQUE: Multidetector CT imaging of the abdomen and pelvis was performed using the standard protocol following bolus administration of intravenous contrast.  CONTRAST:  98m OMNIPAQUE IOHEXOL 300 MG/ML SOLN, 870mOMNIPAQUE IOHEXOL 300 MG/ML SOLN  COMPARISON:  PET-CT 09/09/2013  FINDINGS: BODY WALL: Unremarkable.  LOWER CHEST: Lower periesophageal lymphadenopathy which do not appear hypermetabolic on previous CT.  ABDOMEN/PELVIS:  Liver: No focal abnormality.  Biliary: No evidence of biliary obstruction or stone.  Pancreas: Unremarkable.  Spleen: Unremarkable.  Adrenals: Unremarkable.  Kidneys and ureters: No hydronephrosis or stone.  Bladder: Unremarkable.  Reproductive: Hysterectomy.  Bowel: There is diverticulosis in the distal colon, with sigmoid colonic thickening likely from diverticulitis. Along the left lateral wall of the sigmoid colon, there is a gas and fluid containing collection (abscess) which is either pericolonic or intramural, measuring 3 cm. More medially, in the peritoneal space, there are multiple pockets of gas and fluid, with surrounding peritoneal thickening. One of the larger, just medial to the colon, measures 5 cm in length. There are at least 3 other discrete collections. There is free perforation, with pneumoperitoneum seen at the hepatic hilum. There is extensive bowel abnormality in the right lower quadrant, with hyperenhancing and thickened loops of small bowel in the region of inflammation.  Proximally, there is relative obstruction, with bowel dilatation and fluid levels. No necrotic segment of small bowel identified. No pericecal inflammation.  Retroperitoneum: No mass or adenopathy.  Peritoneum: Fluid collections as above.  Vascular: No acute abnormality.  OSSEOUS: No acute abnormalities.  Critical Value/emergent results were called by telephone at the time of interpretation on 11/03/2013 at 5:55 AM to Dr. FRIgnacia Felling who verbally acknowledged these results.  IMPRESSION: 1. Bowel perforation with pneumoperitoneum and multiple developing peritoneal abscesses (at least 4, up to 5 cm in length).  Sigmoid diverticulitis is the likely cause, although most of the fluid collections reside between inflamed small bowel loops. 2. Small bowel obstruction secondary to the above.   Electronically Signed   By: Jorje Guild M.D.   On: 11/03/2013 05:58    Medications / Allergies: per chart  Antibiotics: Anti-infectives   Start     Dose/Rate Route Frequency Ordered Stop   11/03/13 1600  piperacillin-tazobactam (ZOSYN) IVPB 3.375 g     3.375 g 12.5 mL/hr over 240 Minutes Intravenous Every 8 hours 11/03/13 1233     11/03/13 0800  piperacillin-tazobactam (ZOSYN) IVPB 3.375 g  Status:  Discontinued    Comments:  First dose stat.   3.375 g 12.5 mL/hr over 240 Minutes Intravenous Every 8 hours 11/03/13 0734 11/03/13 1233      Assessment/Plan Hx metastatic lung cancer with thoracic radiotherapy--Dr. [name] Perforated sigmoid colon with multiple abscesses  Emergent exploratory laparotomy, drainage of intra-abdominal abscesses, mobilization of splenic flexure, sigmoid colectomy, colostomy---Dr. Zella Richer 11/03/13 POD#1 Continue with PCA morphine IS Mobilize DC foley NPO and await bowel function WOC consult Continue Zosyn Continue drain Remove dressing tomorrow Continue ICU today, transfer to floor tomorrow if tachycardia improves   Erby Pian, [facility] Pager  [phone] Office [phone]  11/04/2013 8:05 AM

## 2013-11-04 NOTE — Progress Notes (Signed)
I have seen and examined the pt and agree with NP-Riebocks's progress note. Cont' abx Mobilize con't NGT and NPO Ostomy consult

## 2013-11-04 NOTE — Progress Notes (Signed)
Went to see patient room 1230, patient in fair spirits, asked if she was going to have rad tx today,informed her no not today and we would take this one day at a time, patient thanked me for coming to see her 10:38 AM

## 2013-11-04 NOTE — Progress Notes (Signed)
INITIAL NUTRITION ASSESSMENT  DOCUMENTATION CODES Per approved criteria  -Not applicable    INTERVENTION: - Diet advancement per MD - Recommend Glucerna shakes BID when diet advanced - Will continue to monitor   NUTRITION DIAGNOSIS: Inadequate oral intake related to inability to eat as evidenced by NPO.   Goal: Advance diet as tolerated to regular diet  Monitor:  Weights, labs, diet advancement  Reason for Assessment: Malnutrition screening tool   63 y.o. female  Admitting Dx: Severe abdominal pain   ASSESSMENT: Pt is a 63-year-old female who developed abdominal pain with nausea vomiting and a small amount of diarrhea 3 days ago. Her husband had similar symptoms. However her pain worsened and persisted. She got progressive abdominal distention. She presented to the emergency department for evaluation. She underwent a CT scan of the abdomen which demonstrated pneumoperitoneum as well as thickening in the sigmoid colon area suggesting perforated sigmoid colon. There is also an impending bowel obstruction with dilated loops of bowel. There multiple interloop abscesses. 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 She's been treated with radiation therapy at this time. PET scan did not demonstrate any intra-abdominal metastasis. Underwent emergency exploratory laparotomy, drainage of intra-abdominal abscesses, mobilization of splenic flexure, sigmoid colectomy, colostomy yesterday. Has NGT in place, 270ml output total yesterday.    Met with pt and husband who report pt with minimal PO intake since Thursday due to nausea, vomiting, and diarrhea. Was only eating 1 meal/day and a snack, an example was chicken noodle soup. Before then she was eating excellent, several small meals throughout the day and drinking 2 Glucerna shakes/day. Husband attributes this to the steroids she was on. Pt reports she has been gaining weight due to eating well, noted pt's weight up 8 pounds  in the past month.   Pt with severe muscle wasting and subcutaneous fat loss in orbital region and mild/moderate fat loss in clavicles.   Height: Ht Readings from Last 1 Encounters:  11/03/13 5' 4" (1.626 m)    Weight: Wt Readings from Last 1 Encounters:  11/04/13 119 lb 11.4 oz (54.3 kg)    Ideal Body Weight: 120 lb   % Ideal Body Weight: 99%  Wt Readings from Last 10 Encounters:  11/04/13 119 lb 11.4 oz (54.3 kg)  11/04/13 119 lb 11.4 oz (54.3 kg)  11/01/13 115 lb 1.6 oz (52.209 kg)  10/09/13 111 lb 4.8 oz (50.485 kg)  10/03/13 108 lb 2 oz (49.045 kg)  10/01/13 109 lb (49.442 kg)  09/17/13 109 lb (49.442 kg)  09/09/13 109 lb (49.442 kg)  08/28/13 11 lb (4.99 kg)    Usual Body Weight: 111 lb last month  % Usual Body Weight: 107%  BMI:  Body mass index is 20.54 kg/(m^2).  Estimated Nutritional Needs: Kcal: 1600-1750 Protein: 80-100g Fluid: 1.6-1.7L/day  Skin: Abdominal incision   Diet Order:  NPO  EDUCATION NEEDS: -No education needs identified at this time   Intake/Output Summary (Last 24 hours) at 11/04/13 0959 Last data filed at 11/04/13 0900  Gross per 24 hour  Intake 4398.33 ml  Output   2325 ml  Net 2073.33 ml    Last BM: PTA  Labs:   Recent Labs Lab 11/03/13 0304 11/04/13 0333  NA 132* 139  K 3.5* 4.3  CL 91* 102  CO2 24 24  BUN 20 22  CREATININE 0.58 0.53  CALCIUM 9.4 8.6  GLUCOSE 120* 240*    CBG (last 3)  No results found for   this basename: GLUCAP,  in the last 72 hours  Scheduled Meds: . heparin subcutaneous  5,000 Units Subcutaneous 3 times per day  . morphine   Intravenous 6 times per day  . pantoprazole (PROTONIX) IV  40 mg Intravenous Q24H  . piperacillin-tazobactam (ZOSYN)  IV  3.375 g Intravenous Q8H    Continuous Infusions: . dextrose 5% lactated ringers with KCl 20 mEq/L 125 mL/hr at 11/04/13 0725    Past Medical History  Diagnosis Date  . Cancer     lung, hx cervicle    Past Surgical History   Procedure Laterality Date  . Video bronchoscopy Bilateral 08/30/2013    Procedure: VIDEO BRONCHOSCOPY WITH FLUORO;  Surgeon: Michael B Wert, MD;  Location: [facility];  Service: Cardiopulmonary;  Laterality: Bilateral;  . Abdominal hysterectomy      Heather Baron MS, RD, LDN [phone] Pager [phone] After Hours Pager  

## 2013-11-04 NOTE — Progress Notes (Signed)
Pt was in bed when I arrived; husband was bedside and daughter arrived as I was leaving. Pt and husband were very delightful. Pt talked about her faith and presented a great attitude about the outcome of her cancer. Pt said she may have to have surgery and to remove mass. Pt's attitude was very positive. Pt shared with me moments about her family; her husband and dog. Pt asked for prayer with her and husband.  Ernest Haber Chaplain  11/04/13 1200  Clinical Encounter Type  Visited With Patient and family together

## 2013-11-04 NOTE — ED Provider Notes (Signed)
Medical screening examination/treatment/procedure(s) were performed by non-physician practitioner and as supervising physician I was immediately available for consultation/collaboration.    Teressa Lower, MD 11/04/13 2300

## 2013-11-04 NOTE — Consult Note (Signed)
WOC ostomy consult note Stoma type/location:  Consult requested for new colostomy to left lower quad from surgery on 3/7 Stomal assessment/size: Stoma visualized through pouch which is intact with good seal.  Appears to be dark red and flush with skin. Output No stool or flatus; small amt dark red drainage in pouch Ostomy pouching: 1pc  Education provided: Pt has NG and PCA and is first day post-op. [facility] team will plan to follow and begin teaching sessions when stable and out of ICU.  Husband at bedside.  Educational materials left at bedside.  Briefly discussed pouching routines and emptying and showed them a pouch. Supplies ordered to room and answered their questions. Julien Girt MSN, RN, [place], [place], [place]

## 2013-11-05 ENCOUNTER — Ambulatory Visit: Payer: BC Managed Care – PPO

## 2013-11-05 ENCOUNTER — Ambulatory Visit: Payer: BC Managed Care – PPO | Admitting: Radiation Oncology

## 2013-11-05 ENCOUNTER — Telehealth: Payer: Self-pay | Admitting: *Deleted

## 2013-11-05 MED ORDER — PHENOL 1.4 % MT LIQD: 1.0000 | OROMUCOSAL | Status: DC | PRN

## 2013-11-05 MED ORDER — PHENOL 1.4 % MT LIQD
1.0000 | OROMUCOSAL | Status: DC | PRN
  Administered 2013-11-05: 1 via OROMUCOSAL
  Filled 2013-11-05 (×2): qty 177

## 2013-11-05 NOTE — Progress Notes (Signed)
Patient transferring to room 1535.  Report called to [place], Therapist, sports.  Patient to travel by wheelchair.  Will continue to monitor.

## 2013-11-05 NOTE — Progress Notes (Signed)
CARE MANAGEMENT NOTE 11/05/2013  Patient:  Alexis [name],Alexis [name]   Account Number:  [id number]  Date Initiated:  11/05/2013  Documentation initiated by:  Amere Bricco  Subjective/Objective Assessment:   perforated bowel requiring surgical intervention, ongoing treatment for lung and brain ca.     Action/Plan:   from home   Anticipated DC Date:  11/06/2013   Anticipated DC Plan:  HOME/SELF CARE  In-house referral  NA      DC Planning Services  NA      [facility] Choice  NA   Choice offered to / List presented to:  NA   DME arranged  NA      DME agency  NA     [place] arranged  NA      [place] agency  NA   Status of service:  In process, will continue to follow Medicare Important Message given?  NA - LOS <3 / Initial given by admissions (If response is "NO", the following Medicare IM given date fields will be blank) Date Medicare IM given:   Date Additional Medicare IM given:    Discharge Disposition:    Per UR Regulation:  Reviewed for med. necessity/level of care/duration of stay  If discussed at [place] of Stay Meetings, dates discussed:    Comments:  [id number]/Dontavian Marchi Eldridge Dace, BSN, [place] [phone] Chart Reviewed for discharge and hospital needs. Discharge needs at time of review:  None present will follow for needs. Review of patient progress due on [id number]. Scheduled to be transferred to med/surg floor [id number].

## 2013-11-05 NOTE — Telephone Encounter (Signed)
Called and spoke with [facility], patient declines to have radiation today, too much pain, soreness from surgery, N/g tube and PCA, patient asked RN "Do not let them come get me fro radiation", will inform RT therapist, thanked Stephanie,RN and asked her to let patient know I called and was asking about her,spoke with Nicole,RT therapist, infomred her of patient status no treatment today 9:18 AM  9:18 AM

## 2013-11-05 NOTE — Progress Notes (Signed)
2 Days Post-Op  Subjective: Pt con't to do well. OOBTC x6h yesterday  Objective: Vital signs in last 24 hours: Temp:  [97.9 F (36.6 C)-98.4 F (36.9 C)] 98.4 F (36.9 C) (03/10 0400) Pulse Rate:  [71-106] 106 (03/10 0400) Resp:  [9-22] 22 (03/10 0400) BP: (105-140)/(45-77) 140/49 mmHg (03/10 0400) SpO2:  [96 %-100 %] 96 % (03/10 0400) Weight:  [121 lb 11.1 oz (55.2 kg)] 121 lb 11.1 oz (55.2 kg) (03/10 0400) Last BM Date:  (prior to admission )  Intake/Output from previous day: 03/09 0701 - 03/10 0700 In: 3042.5 [I.V.:2875; NG/GT:30; IV Piggyback:137.5] Out: 965 [Urine:525; Emesis/NG output:400; Drains:40] Intake/Output this shift:    General appearance: alert and cooperative GI: s/nt/nd, ostomy pink, midline wound c/d/i  Lab Results:   Recent Labs  11/03/13 0304 11/04/13 0333  WBC 14.2* 8.5  HGB 12.8 10.2*  HCT 37.3 30.3*  PLT 324 292   BMET  Recent Labs  11/03/13 0304 11/04/13 0333  NA 132* 139  K 3.5* 4.3  CL 91* 102  CO2 24 24  GLUCOSE 120* 240*  BUN 20 22  CREATININE 0.58 0.53  CALCIUM 9.4 8.6   PT/INR No results found for this basename: LABPROT, INR,  in the last 72 hours ABG No results found for this basename: PHART, PCO2, PO2, HCO3,  in the last 72 hours  Studies/Results: No results found.  Anti-infectives: Anti-infectives   Start     Dose/Rate Route Frequency Ordered Stop   11/03/13 1600  piperacillin-tazobactam (ZOSYN) IVPB 3.375 g     3.375 g 12.5 mL/hr over 240 Minutes Intravenous Every 8 hours 11/03/13 1233     11/03/13 0800  piperacillin-tazobactam (ZOSYN) IVPB 3.375 g  Status:  Discontinued    Comments:  First dose stat.   3.375 g 12.5 mL/hr over 240 Minutes Intravenous Every 8 hours 11/03/13 0734 11/03/13 1233      Assessment/Plan: s/p Procedure(s): EXPLORATORY LAPAROTOMY, DRAINAGE OF INTRA  ABDOMINAL ABSCESSES, MOBILIZATION OF SPLENIC FLEXURE, SIGMOID COLECTOMY WITH COLOSTOMY (N/A) To floor Dressing changes BID Con't  NGT Await bowel function Con't abx   LOS: 2 days    Rosario Jacks., [facility] 11/05/2013

## 2013-11-06 ENCOUNTER — Ambulatory Visit: Payer: BC Managed Care – PPO

## 2013-11-06 ENCOUNTER — Telehealth: Payer: Self-pay | Admitting: *Deleted

## 2013-11-06 ENCOUNTER — Inpatient Hospital Stay (HOSPITAL_COMMUNITY): Payer: BC Managed Care – PPO

## 2013-11-06 LAB — GLUCOSE, CAPILLARY: Glucose-Capillary: 88 mg/dL (ref 70–99)

## 2013-11-06 LAB — PREALBUMIN: Prealbumin: 6.7 mg/dL — ABNORMAL LOW (ref 17.0–34.0)

## 2013-11-06 MED ORDER — ACETAMINOPHEN 10 MG/ML IV SOLN
1000.0000 mg | Freq: Four times a day (QID) | INTRAVENOUS | Status: AC
  Administered 2013-11-06 – 2013-11-07 (×4): 1000 mg via INTRAVENOUS
  Filled 2013-11-06 (×4): qty 100

## 2013-11-06 NOTE — Progress Notes (Signed)
3 Days Post-Op  Subjective: She is very sore and hurts all over, even turning to the side.  NG was not working, I have it working now.  Nothing coming from ostomy except some clear bloody liquid.     Objective: Vital signs in last 24 hours: Temp:  [98 F (36.7 C)-98.7 F (37.1 C)] 98.7 F (37.1 C) (03/11 0547) Pulse Rate:  [78-104] 78 (03/11 0547) Resp:  [12-18] 14 (03/11 0755) BP: (106-135)/(43-72) 120/62 mmHg (03/11 0547) SpO2:  [93 %-99 %] 97 % (03/11 0755) FiO2 (%):  [40 %-46 %] 40 % (03/11 0437) Last BM Date: 11/03/13 Nothing from the colostomy so far 500 from the NG, drain 71 ml, Afebrile, VSS Labs OK Intake/Output from previous day: 03/10 0701 - 03/11 0700 In: 2675 [I.V.:2612.5; IV Piggyback:62.5] Out: 2701 [Urine:2130; Emesis/NG output:500; Drains:71] Intake/Output this shift:    General appearance: alert, cooperative and no distress Resp: few rales in right base, left BS diminished. GI: she has a few BS, nothing from colostomy.  Picture of colosotomy is below.Marland Kitchen  Open wound is clean, tender and a little dry.  Increase dressing changes.  Colostomy today 11/06/13  Lab Results:   Recent Labs  11/04/13 0333  WBC 8.5  HGB 10.2*  HCT 30.3*  PLT 292    BMET  Recent Labs  11/04/13 0333  NA 139  K 4.3  CL 102  CO2 24  GLUCOSE 240*  BUN 22  CREATININE 0.53  CALCIUM 8.6   PT/INR No results found for this basename: LABPROT, INR,  in the last 72 hours   Recent Labs Lab 11/03/13 0304  AST 17  ALT 20  ALKPHOS 104  BILITOT 0.8  PROT 7.1  ALBUMIN 2.4*     Lipase     Component Value Date/Time   LIPASE 8* 11/03/2013 0304     Studies/Results: No results found.  Medications: . heparin subcutaneous  5,000 Units Subcutaneous 3 times per day  . morphine   Intravenous 6 times per day  . pantoprazole (PROTONIX) IV  40 mg Intravenous Q24H  . piperacillin-tazobactam (ZOSYN)  IV  3.375 g Intravenous Q8H   . dextrose 5% lactated ringers with KCl 20  mEq/L 125 mL/hr at 11/06/13 0998   Prior to Admission medications   Medication Sig Start Date End Date Taking? Authorizing Provider  ALPRAZolam Duanne Moron) 0.5 MG tablet One four times a day as needed for nerves or pain 09/09/13  Yes Tanda Rockers, MD  emollient (BIAFINE) cream Apply 1 application topically daily. Apply daily after rad tx 11/01/13  Yes Marye Round, MD  fluconazole (DIFLUCAN) 100 MG tablet Take 1 tablet (100 mg total) by mouth daily. Take 2 tabs x 1, then 1 tab QD x 9 additional days. 11/01/13  Yes Marye Round, MD  HYDROcodone-acetaminophen [facility]) 10-325 MG per tablet Take 1 tablet by mouth every 4 (four) hours as needed. 09/03/13  Yes Tanda Rockers, MD  omeprazole (PRILOSEC) 20 MG capsule Take 20 mg by mouth daily.   Yes Historical Provider, MD  ondansetron (ZOFRAN) 8 MG tablet Take 1 tablet (8 mg total) by mouth every 8 (eight) hours as needed for nausea or vomiting. 10/31/13  Yes Marye Round, MD    Assessment/Plan Perforated sigmoid colon with multiple abscesses  Emergent exploratory laparotomy, drainage of intra-abdominal abscesses, mobilization of splenic flexure, sigmoid colectomy, colostomy---Dr. Zella Richer 11/03/13 Hx metastatic lung cancer with thoracic radiotherapy--Dr. [name] She has been skipping treatments because she feels so bad. COPD/Tobacco  use hx Hypertension    Plan:  Start her on some IV tylenol to help with the pain, mobilize, PT consult. I am going to get a chest Xray, add a prealbumin to the AM labs. The ostomy doesn't look great, but hopefully this will improve.  I will keep her NG till we get something from the ostomy.    LOS: 3 days    Alexis [name] 11/06/2013

## 2013-11-06 NOTE — Progress Notes (Signed)
I have seen and examined the pt and agree with PA-Jenning's progress note. PT consult to help mobilize Watch Ostomy-await bowel function

## 2013-11-06 NOTE — Consult Note (Signed)
WOC ostomy follow up Stoma type/location: LLQ, end colostomy Stomal assessment/size:  Dusky, CCS to bedside to assess. 1 3/4" round, slightly budded from skin. May slough off some Peristomal assessment: intact Treatment options for stomal/peristomal skin: added 2" barrier ring since the stoma is only minimally budded Output bloody drainage only, no flatus Ostomy pouching: 1pc with 2" barrier ring.  Education provided: discussed stoma creation and pouch emptying with patient today. Her husband will be assisting with care but he is not in the room currently.  Reviewed lock and roll pouch closure with patient.  She is on her PCA still and a bit sleepy at times.    Supplies in the room, WOC will follow along with you for ostomy teaching and care. [place], [employer]

## 2013-11-06 NOTE — Telephone Encounter (Signed)
Called the floor spoke with patient's nurse,Breanna,RN, asked status of patient, patient refusing to have rad tx today, "too sore,pain", thanked the nurse, even stating we would bring her down in her bed,  "No" per RN  will inform Dr.Moody  9:52 AM

## 2013-11-07 ENCOUNTER — Encounter: Payer: Self-pay | Admitting: *Deleted

## 2013-11-07 ENCOUNTER — Ambulatory Visit: Payer: BC Managed Care – PPO

## 2013-11-07 ENCOUNTER — Telehealth: Payer: Self-pay | Admitting: *Deleted

## 2013-11-07 ENCOUNTER — Ambulatory Visit
Admit: 2013-11-07 | Discharge: 2013-11-07 | Disposition: A | Discharge status: Home / Self Care | Payer: BC Managed Care – PPO | Attending: Radiation Oncology | Admitting: Radiation Oncology

## 2013-11-07 DIAGNOSIS — D72829 Elevated white blood cell count, unspecified: Secondary | ICD-10-CM

## 2013-11-07 DIAGNOSIS — E46 Unspecified protein-calorie malnutrition: Secondary | ICD-10-CM

## 2013-11-07 LAB — BASIC METABOLIC PANEL
BUN: 6 mg/dL (ref 6–23)
CO2: 34 mEq/L — ABNORMAL HIGH (ref 19–32)
Calcium: 8.2 mg/dL — ABNORMAL LOW (ref 8.4–10.5)
Chloride: 98 mEq/L (ref 96–112)
Creatinine, Ser: 0.45 mg/dL — ABNORMAL LOW (ref 0.50–1.10)
GFR calc Af Amer: >90 mL/min (ref >90)
GFR calc non Af Amer: >90 mL/min (ref >90)
Glucose, Bld: 97 mg/dL (ref 70–99)
Potassium: 3.9 mEq/L (ref 3.7–5.3)
Sodium: 139 mEq/L (ref 137–147)

## 2013-11-07 LAB — CBC
HCT: 25.7 % — ABNORMAL LOW (ref 36.0–46.0)
Hemoglobin: 8.3 g/dL — ABNORMAL LOW (ref 12.0–15.0)
MCH: 30.6 pg (ref 26.0–34.0)
MCHC: 32.3 g/dL (ref 30.0–36.0)
MCV: 94.8 fL (ref 78.0–100.0)
Platelets: 296 10*3/uL (ref 150–400)
RBC: 2.71 MIL/uL — ABNORMAL LOW (ref 3.87–5.11)
RDW: 16 % — ABNORMAL HIGH (ref 11.5–15.5)
WBC: 15.9 10*3/uL — ABNORMAL HIGH (ref 4.0–10.5)

## 2013-11-07 LAB — GLUCOSE, CAPILLARY: Glucose-Capillary: 123 mg/dL — ABNORMAL HIGH (ref 70–99)

## 2013-11-07 MED ORDER — M.V.I. ADULT IV INJ
INJECTION | INTRAVENOUS | Status: AC
  Administered 2013-11-07: 18:00:00 via INTRAVENOUS
  Filled 2013-11-07: qty 1000

## 2013-11-07 MED ORDER — SODIUM CHLORIDE 0.9 % IJ SOLN
10.0000 mL | INTRAMUSCULAR | Status: DC | PRN
  Administered 2013-11-08: 30 mL
  Administered 2013-11-09 – 2013-11-16 (×6): 10 mL
  Administered 2013-11-17: 20 mL
  Administered 2013-11-17: 10 mL
  Administered 2013-11-18: 20 mL

## 2013-11-07 MED ORDER — INSULIN ASPART 100 UNIT/ML ~~LOC~~ SOLN
0.0000 [IU] | Freq: Four times a day (QID) | SUBCUTANEOUS | Status: DC
Start: 2013-11-08 — End: 2013-11-14
  Administered 2013-11-07 – 2013-11-10 (×9): 1 [IU] via SUBCUTANEOUS
  Administered 2013-11-10: 2 [IU] via SUBCUTANEOUS
  Administered 2013-11-10 – 2013-11-12 (×5): 1 [IU] via SUBCUTANEOUS
  Administered 2013-11-12: 2 [IU] via SUBCUTANEOUS
  Administered 2013-11-12 – 2013-11-13 (×6): 1 [IU] via SUBCUTANEOUS
  Administered 2013-11-14: 2 [IU] via SUBCUTANEOUS
  Administered 2013-11-14: 1 [IU] via SUBCUTANEOUS

## 2013-11-07 MED ORDER — KCL-LACTATED RINGERS-D5W 20 MEQ/L IV SOLN
INTRAVENOUS | Status: AC
  Administered 2013-11-07: 23:00:00 via INTRAVENOUS
  Filled 2013-11-07: qty 1000

## 2013-11-07 MED ORDER — ACETAMINOPHEN 10 MG/ML IV SOLN
1000.0000 mg | Freq: Four times a day (QID) | INTRAVENOUS | Status: AC
  Administered 2013-11-07 – 2013-11-08 (×4): 1000 mg via INTRAVENOUS
  Filled 2013-11-07 (×4): qty 100

## 2013-11-07 MED ORDER — FAT EMULSION 20 % IV EMUL
250.0000 mL | INTRAVENOUS | Status: AC
  Administered 2013-11-07: 250 mL via INTRAVENOUS
  Filled 2013-11-07: qty 250

## 2013-11-07 NOTE — Progress Notes (Signed)
PARENTERAL NUTRITION CONSULT NOTE - INITIAL  Pharmacy Consult for TNA Indication: prolonged post-operative ileus  Allergies  Allergen Reactions  . Codeine Nausea And Vomiting    Patient Measurements: Height: 5\' 4"  (162.6 cm) Weight: 121 lb 11.1 oz (55.2 kg) IBW/kg (Calculated) : 54.7  Vital Signs: Temp: 97.7 F (36.5 C) (03/12 0500) Temp src: Oral (03/12 0500) BP: 132/76 mmHg (03/12 0500) Pulse Rate: 88 (03/12 0500) Intake/Output from previous day: 03/11 0701 - 03/12 0700 In: 2188.1 [I.V.:2158.1; NG/GT:30] Out: 4125 [Urine:4050; Emesis/NG output:50; Drains:25] Intake/Output from this shift: Total I/O In: 0  Out: 300 [Urine:300]  Labs:  Recent Labs  11/07/13 0455  WBC 15.9*  HGB 8.3*  HCT 25.7*  PLT 296     Recent Labs  11/06/13 1130 11/07/13 0455  NA  --  139  K  --  3.9  CL  --  98  CO2  --  34*  GLUCOSE  --  97  BUN  --  6  CREATININE  --  0.45*  CALCIUM  --  8.2*  PREALBUMIN 6.7*  --    Estimated Creatinine Clearance: 63 ml/min (by C-G formula based on Cr of 0.45).    Recent Labs  11/06/13 2120  GLUCAP 88    Medical History: Past Medical History  Diagnosis Date  . Cancer     lung, hx cervicle    Medications:  Scheduled:  . acetaminophen  1,000 mg Intravenous 4 times per day  . heparin subcutaneous  5,000 Units Subcutaneous 3 times per day  . morphine   Intravenous 6 times per day  . pantoprazole (PROTONIX) IV  40 mg Intravenous Q24H  . piperacillin-tazobactam (ZOSYN)  IV  3.375 g Intravenous Q8H   Infusions:  . 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  On 3/8 underwent emergency exploratory laparotomy, drainage of intra-abdominal abscesses, mobilization of splenic flexure, sigmoid colectomy, colostomy.   Bowel function has not yet returned and prealbumin is 6.7.  Orders received today for PICC placement and TNA initiation with pharmacy assistance.   Nutritional Goals: (per RD assessment 3/9); 80-100 grams of protein per day, 1600 - 1700 kCal/day  Goal TNA Formula and Regimen: Clinimix-E 5/15 at 70 mL/hr + Fat Emulsion 20% at 10 mL/hr to deliver 84 grams protein/day, 1672 kCal/day  Plan:  1. At 1800 tonight:      -Begin Clinimix-E 5/15 at 40 mL/hr + Fat Emulsion 20% at 10 mL/hr via PICC.      -Multivitamins in TNA daily.  Trace Elements MWF only due to national shortage.      -Reduce mIVF to 30 mL/hr. 2. CBGs q6h, cover with sensitive-scale SSI. 3. Full TNA labs tomorrow, then every Monday and Thursday. 4. Ultimately increase Clinimix as tolerated to goal rate of 70 mL/hr.  Clayburn Pert, PharmD, BCPS Pager: [phone] 11/07/2013  8:43 AM

## 2013-11-07 NOTE — Progress Notes (Signed)
NUTRITION FOLLOW UP/CONSULT FOR TPN  Intervention:   - TPN per pharmacist - Diet advancement per MD - Will continue to monitor   Nutrition Dx:   Inadequate oral intake related to inability to eat as evidenced by NPO - ongoing    Goal:   Advance diet as tolerated to regular diet - not met  New goal: TPN to meet >90% of estimated nutritional needs   Monitor:   Weights, labs, diet advancement, NGT output, TPN, BM  Assessment:   Pt is a 63 year old female who developed abdominal pain with nausea vomiting and a small amount of diarrhea 3 days ago. Her husband had similar symptoms. However her pain worsened and persisted. She got progressive abdominal distention. She presented to the emergency department for evaluation. She underwent a CT scan of the abdomen which demonstrated pneumoperitoneum as well as thickening in the sigmoid colon area suggesting perforated sigmoid colon. There is also an impending bowel obstruction with dilated loops of bowel. There multiple interloop abscesses. 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 She's been treated with radiation therapy at this time. PET scan did not demonstrate any intra-abdominal metastasis. Underwent emergency exploratory laparotomy, drainage of intra-abdominal abscesses, mobilization of splenic flexure, sigmoid colectomy, colostomy yesterday. Has NGT in place, 232m output total yesterday.   3/9 - Met with pt and husband who report pt with minimal PO intake since Thursday due to nausea, vomiting, and diarrhea. Was only eating 1 meal/day and a snack, an example was chicken noodle soup. Before then she was eating excellent, several small meals throughout the day and drinking 2 Glucerna shakes/day. Husband attributes this to the steroids she was on. Pt reports she has been gaining weight due to eating well, noted pt's weight up 8 pounds in the past month.   3/12 - Nothing from ostomy today, "waiting for GI tract to work"  per PA notes. NGT was not advanced far enough, plans to advance today to help fix low NGT output. Plan is to place PICC and start TPN. Discussed with pharmacist and pt. Met with pt who reports c/o ongoing nausea and stomach pain.  Weight up 6 pounds since admission.   Per pharmacist, plan is for goal rate of Clinimix-E 5/15 at 70 mL/hr + Fat Emulsion 20% at 10 mL/hr to deliver 84 grams protein/day, 1672 kCal/day which will meet 100% of estimated nutritional needs  PALB low at 6.7 mg/dL  Height: Ht Readings from Last 1 Encounters:  11/03/13 _0  (1.626 m)    Weight Status:   Wt Readings from Last 1 Encounters:  11/05/13 121 lb 11.1 oz (55.2 kg)    Re-estimated needs:  Kcal: 1600-1750  Protein: 80-100g  Fluid: 1.6-1.7L/day   Skin: Abdomen incision   Diet Order:  NPO   Intake/Output Summary (Last 24 hours) at 11/07/13 0856 Last data filed at 11/07/13 0751  Gross per 24 hour  Intake 2188.09 ml  Output   4425 ml  Net -2236.91 ml    Last BM: 3/8   Labs:   Recent Labs Lab 11/03/13 0304 11/04/13 0333 11/07/13 0455  NA 132* 139 139  K 3.5* 4.3 3.9  CL 91* 102 98  CO2 24 24 34*  BUN _1 CREATININE 0.58 0.53 0.45*  CALCIUM 9.4 8.6 8.2*  GLUCOSE 120* 240* 97    CBG (last 3)   Recent Labs  11/06/13 2120  GLUCAP 88    Scheduled Meds: . acetaminophen  1,000 mg Intravenous  4 times per day  . heparin subcutaneous  5,000 Units Subcutaneous 3 times per day  . [START ON 11/08/2013] insulin aspart  0-9 Units Subcutaneous 4 times per day  . morphine   Intravenous 6 times per day  . pantoprazole (PROTONIX) IV  40 mg Intravenous Q24H  . piperacillin-tazobactam (ZOSYN)  IV  3.375 g Intravenous Q8H    Continuous Infusions: . dextrose 5% lactated ringers with KCl 20 mEq/L 80 mL/hr at 11/06/13 1932  . dextrose 5% lactated ringers with KCl 20 mEq/L    . Marland KitchenTPN (CLINIMIX-E) Adult     And  . fat emulsion       Mikey College MS, RD, LDN [phone] Pager [phone]  After Hours Pager

## 2013-11-07 NOTE — Progress Notes (Signed)
[facility] Therapy Treatment Record Phone [phone]   Radiation Therapy was administered to Alexis [name] on: 11/07/2013  4:03 PM and was treatment # 5 out of a planned course of 25 treatments.

## 2013-11-07 NOTE — Progress Notes (Unsigned)
Went to patient room 1535,visited with patient, encouraged her to come for radiation treatment today, informed her we would bring her down in her bed, Paula,RN, her nurse in her room stated"We have called the IV team to have them place a picc line in patient", patient is npo, has n/g tube, asked if she had gotten out of bed as yet"NO, too sore", but RN Nevin Bloodgood stated she would get up today", patient has agreed to have radiation after all after Picc line obtained, gave Paula,RN my number  She will call afterwards ,thnaked her, we will schedule patient then ,patient can use her PCA ,patient gave verbal understanding, went to [place] [facility], informed Jennifer,RT therapist, to keep patient on schedule, just mnight be a different time today 9:24 AM

## 2013-11-07 NOTE — Progress Notes (Signed)
PT Cancellation Note  Patient Details Name: TALLI KIMMER MRN: [id number] DOB: 1951/04/01   Cancelled Treatment:    Reason Eval/Treat Not Completed: Patient at procedure or test/unavailable (PICC then going tyo radiology. RN said to check in PM.)   Marcelino Freestone PT [phone]  11/07/2013, 10:20 AM

## 2013-11-07 NOTE — Evaluation (Signed)
Physical Therapy Evaluation Patient Details Name: Alexis [name] MRN: [id number] DOB: 11/12/1950 Today's Date: 11/07/2013 Time: 3235-5732 PT Time Calculation (min): 23 min  PT Assessment / Plan / Recommendation History of Present Illness  63 yo female admitted with emergent exp lap 3/8, multiple abscesses. Hx of met lung cancer, HTN, anemia  Clinical Impression  On eval, pt required Min assist for mobility-able to ambulate ~500 feet. Pt is deconditioned but she tolerated activity well. Recommend HHPT, 24/7 supervision/assist    PT Assessment  Patient needs continued PT services    Follow Up Recommendations  Home health PT;Supervision/Assistance - 24 hour    Does the patient have the potential to tolerate intense rehabilitation      Barriers to Discharge        Equipment Recommendations  None recommended by PT    Recommendations for Other Services     Frequency Min 3X/week    Precautions / Restrictions Precautions Precautions: Fall Precaution Comments: multiple lines/leads, drains Restrictions Weight Bearing Restrictions: No   Pertinent Vitals/Pain Abdomen 5/10. PCA encouraged      Mobility  Bed Mobility Overal bed mobility: Needs Assistance Bed Mobility: Sit to Supine Sit to supine: Mod assist General bed mobility comments: Assist for bil LEs onto bed. Increased time.  Transfers Overall transfer level: Needs assistance Transfers: Sit to/from Stand Sit to Stand: Min guard General transfer comment: close guard for safety.  Ambulation/Gait Ambulation/Gait assistance: Min assist Ambulation Distance (Feet): 500 Feet Assistive device: Rolling walker (2 wheeled) Gait Pattern/deviations: Step-through pattern;Decreased stride length General Gait Details: O2 sats 91% on RA. Tolerated well    Exercises     PT Diagnosis: Difficulty walking;Generalized weakness  PT Problem List: Decreased strength;Decreased activity tolerance;Decreased mobility;Pain;Decreased knowledge  of use of DME PT Treatment Interventions: DME instruction;Gait training;Functional mobility training;Therapeutic activities;Therapeutic exercise;Patient/family education     PT Goals(Current goals can be found in the care plan section) Acute Rehab PT Goals Patient Stated Goal: to get better. home PT Goal Formulation: With patient/family Time For Goal Achievement: 11/21/13 Potential to Achieve Goals: Good  Visit Information  Last PT Received On: 11/07/13 Assistance Needed: +1 Reason Eval/Treat Not Completed: Patient at procedure or test/unavailable (PICC then going tyo radiology. RN said to check in PM.) History of Present Illness: 63 yo female admitted with emergent exp lap 3/8, multiple abscesses. Hx of met lung cancer, HTN, anemia       Prior Functioning  Home Living Family/patient expects to be discharged to:: Private residence Living Arrangements: Spouse/significant other Available Help at Discharge: Family Type of Home: House Home Access: Stairs to enter Technical brewer of Steps: 3 Entrance Stairs-Rails: Right [place]: Two level;Able to live on main level with bedroom/bathroom Home Equipment: Gilford Rile - 2 wheels;Cane - single point;Shower seat;Bedside commode Additional Comments: reacher. All DME belonged to husband Prior Function Level of Independence: Independent Communication Communication: No difficulties    Cognition  Cognition Arousal/Alertness: Awake/alert Behavior During Therapy: WFL for tasks assessed/performed Overall Cognitive Status: Within Functional Limits for tasks assessed    Extremity/Trunk Assessment Upper Extremity Assessment Upper Extremity Assessment: Generalized weakness Lower Extremity Assessment Lower Extremity Assessment: Generalized weakness Cervical / Trunk Assessment Cervical / Trunk Assessment: Normal   Balance    End of Session PT - End of Session Activity Tolerance: Patient tolerated treatment well Patient left: in  bed;with call bell/phone within reach;with family/visitor present  GP     Weston Anna, MPT Pager: [phone]

## 2013-11-07 NOTE — Progress Notes (Signed)
4 Days Post-Op  Subjective: She feels about the same, nothing from ostomy except for some stomal sweat.  No change in the appearance of the ostomy from yesterday.  Objective: Vital signs in last 24 hours: Temp:  [97.7 F (36.5 C)-98.4 F (36.9 C)] 97.7 F (36.5 C) (03/12 0500) Pulse Rate:  [82-96] 88 (03/12 0500) Resp:  [11-20] 20 (03/12 0500) BP: (120-147)/(56-76) 132/76 mmHg (03/12 0500) SpO2:  [94 %-98 %] 98 % (03/12 0500) FiO2 (%):  [36 %-43 %] 36 % (03/12 0400) Last BM Date: 11/03/13 Drain:25 ml NG: 50 ml VSS WBC up to 15.9, H/h down to 8.3/25 Prealbumin 6.7 Intake/Output from previous day: 03/11 0701 - 03/12 0700 In: 2188.1 [I.V.:2158.1; NG/GT:30] Out: 4125 [Urine:4050; Emesis/NG output:50; Drains:25] Intake/Output this shift:    General appearance: alert, cooperative, no distress and tired. Resp: limited BS both lungs ABD:  No distension, some serous-bloody colored drainage from ostomy, no gas in the bag, no stool.    Lab Results:   Recent Labs  11/07/13 0455  WBC 15.9*  HGB 8.3*  HCT 25.7*  PLT 296    BMET  Recent Labs  11/07/13 0455  NA 139  K 3.9  CL 98  CO2 34*  GLUCOSE 97  BUN 6  CREATININE 0.45*  CALCIUM 8.2*   PT/INR No results found for this basename: LABPROT, INR,  in the last 72 hours   Recent Labs Lab 11/03/13 0304  AST 17  ALT 20  ALKPHOS 104  BILITOT 0.8  PROT 7.1  ALBUMIN 2.4*     Lipase     Component Value Date/Time   LIPASE 8* 11/03/2013 0304     Studies/Results: Dg Chest 2 View  11/06/2013   CLINICAL DATA Shortness of breath.  EXAM CHEST  2 VIEW  FINDINGS Right upper lobe mass lesion again noted. There is a new dense right upper lobe infiltrate consistent with pneumonia. Bibasilar atelectasis noted. Questionable pulmonary nodule left mid lung field noted. Close follow-up chest x-rays suggested for continued evaluation. Heart size normal. NG tube noted with tip projected over upper portion of stomach, more distal  placement should be considered. No acute bony abnormality.  IMPRESSION 1. Persistent right upper lobe mass. 2. Dense right upper lobe infiltrate consistent with pneumonia. 3. Bibasilar atelectasis. Cannot exclude left lower lobe pulmonary nodule, close follow-up chest x-ray is suggested. 4. NG tube noted with tip projected over the upper stomach, more distal placement should be considered.  SIGNATURE  Electronically Signed   By: Marcello Moores  Register   On: 11/06/2013 11:25    Medications: . heparin subcutaneous  5,000 Units Subcutaneous 3 times per day  . morphine   Intravenous 6 times per day  . pantoprazole (PROTONIX) IV  40 mg Intravenous Q24H  . piperacillin-tazobactam (ZOSYN)  IV  3.375 g Intravenous Q8H    Assessment/Plan Perforated sigmoid colon with multiple abscesses  Emergent exploratory laparotomy, drainage of intra-abdominal abscesses, mobilization of splenic flexure, sigmoid colectomy, colostomy---Dr. Zella Richer 11/03/13  Hx metastatic lung cancer with thoracic radiotherapy--Dr. [name]  She has been skipping treatments because she feels so bad.  COPD/Tobacco use hx  Hypertension Anemia, this is new Deconditioning and Malnutrition Post op ileus   Plan:  I am going to place a PICC line and start TNA.  She was not walked and I talked with nurse about that.  I will recheck her H/H later today after she gets Picc line in.  Wait for GI tract to work.  Her CXR yesterday, shows  RUL mass, bibasilar atelectasis, possible LLL nodule.  NG isn't in far enough, and I miss that yesterday.  It would explain low NG output.  I will advance it today. She is on day 4 of Zosyn, the tylenol did help with her pain.  We will continue that for now. Foley catheter is out.  LOS: 4 days    Alexis [name] 11/07/2013

## 2013-11-07 NOTE — Progress Notes (Signed)
Peripherally Inserted Central Catheter/Midline Placement  The IV Nurse has discussed with the patient and/or persons authorized to consent for the patient, the purpose of this procedure and the potential benefits and risks involved with this procedure.  The benefits include less needle sticks, lab draws from the catheter and patient may be discharged home with the catheter.  Risks include, but not limited to, infection, bleeding, blood clot (thrombus formation), and puncture of an artery; nerve damage and irregular heat beat.  Alternatives to this procedure were also discussed.  PICC/Midline Placement Documentation        Alexis [name] 11/07/2013, 11:18 AM

## 2013-11-07 NOTE — Progress Notes (Signed)
I have seen and examined the pt and agree with PA-Jenning's progress note. TPN/PICC Will digitialize her ostomy today to check for viability, I believe its just congested. Midline wound looks good, con't dressing changes

## 2013-11-07 NOTE — Telephone Encounter (Signed)
Called the floor asking to speak with Paula,RN, asking if patient would be willing to come for rad tx today between 3-330pm, "That RN is at [facility] I relay message?" asked her to call me at [phone]  To let us know if patient says yes or no for rad tx today, 1:25 PM

## 2013-11-07 NOTE — Telephone Encounter (Signed)
called the floor and spoke with Ms Graefe's RN,Paula,, patient has changed her mind earlier , Picc line in, asked that  She encourage patient to come for rad tx today at 330, pm, we will be up there around 3-15 pm, she will try, paula will call if patient refuses, thanked her, let Nira Conn and Amy RT therapist and Nicki Reaper transporter  status 2:20 PM

## 2013-11-08 ENCOUNTER — Ambulatory Visit
Admission: RE | Admit: 2013-11-08 | Discharge: 2013-11-08 | Disposition: A | Discharge status: Home / Self Care | Payer: BC Managed Care – PPO | Source: Ambulatory Visit | Attending: Radiation Oncology | Admitting: Radiation Oncology

## 2013-11-08 ENCOUNTER — Inpatient Hospital Stay (HOSPITAL_COMMUNITY): Payer: BC Managed Care – PPO

## 2013-11-08 ENCOUNTER — Encounter (HOSPITAL_COMMUNITY): Payer: Self-pay | Admitting: Radiology

## 2013-11-08 ENCOUNTER — Ambulatory Visit: Payer: BC Managed Care – PPO

## 2013-11-08 ENCOUNTER — Ambulatory Visit
Admit: 2013-11-08 | Discharge: 2013-11-08 | Disposition: A | Discharge status: Home / Self Care | Payer: BC Managed Care – PPO | Attending: Radiation Oncology | Admitting: Radiation Oncology

## 2013-11-08 DIAGNOSIS — D72829 Elevated white blood cell count, unspecified: Secondary | ICD-10-CM

## 2013-11-08 DIAGNOSIS — C3410 Malignant neoplasm of upper lobe, unspecified bronchus or lung: Secondary | ICD-10-CM

## 2013-11-08 DIAGNOSIS — J189 Pneumonia, unspecified organism: Secondary | ICD-10-CM

## 2013-11-08 DIAGNOSIS — D649 Anemia, unspecified: Secondary | ICD-10-CM

## 2013-11-08 LAB — DIFFERENTIAL
Basophils Absolute: 0 10*3/uL (ref 0.0–0.1)
Basophils Relative: 0 % (ref 0–1)
Eosinophils Absolute: 0.2 10*3/uL (ref 0.0–0.7)
Eosinophils Relative: 1 % (ref 0–5)
Lymphocytes Relative: 6 % — ABNORMAL LOW (ref 12–46)
Lymphs Abs: 1.2 10*3/uL (ref 0.7–4.0)
Monocytes Absolute: 1 10*3/uL (ref 0.1–1.0)
Monocytes Relative: 5 % (ref 3–12)
Neutro Abs: 16.8 10*3/uL — ABNORMAL HIGH (ref 1.7–7.7)
Neutrophils Relative %: 88 % — ABNORMAL HIGH (ref 43–77)

## 2013-11-08 LAB — COMPREHENSIVE METABOLIC PANEL
ALT: 14 U/L (ref 0–35)
AST: 12 U/L (ref 0–37)
Albumin: 1.6 g/dL — ABNORMAL LOW (ref 3.5–5.2)
Alkaline Phosphatase: 83 U/L (ref 39–117)
BUN: 6 mg/dL (ref 6–23)
CO2: 33 mEq/L — ABNORMAL HIGH (ref 19–32)
Calcium: 8.3 mg/dL — ABNORMAL LOW (ref 8.4–10.5)
Chloride: 96 mEq/L (ref 96–112)
Creatinine, Ser: 0.39 mg/dL — ABNORMAL LOW (ref 0.50–1.10)
GFR calc Af Amer: >90 mL/min (ref >90)
GFR calc non Af Amer: >90 mL/min (ref >90)
Glucose, Bld: 130 mg/dL — ABNORMAL HIGH (ref 70–99)
Potassium: 3.7 mEq/L (ref 3.7–5.3)
Sodium: 139 mEq/L (ref 137–147)
Total Bilirubin: 0.5 mg/dL (ref 0.3–1.2)
Total Protein: 5.2 g/dL — ABNORMAL LOW (ref 6.0–8.3)

## 2013-11-08 LAB — URINALYSIS, ROUTINE W REFLEX MICROSCOPIC
Bilirubin Urine: NEGATIVE
Glucose, UA: NEGATIVE mg/dL
Ketones, ur: NEGATIVE mg/dL
Leukocytes, UA: NEGATIVE
Nitrite: NEGATIVE
Protein, ur: NEGATIVE mg/dL
Specific Gravity, Urine: 1.039 — ABNORMAL HIGH (ref 1.005–1.030)
Urobilinogen, UA: 0.2 mg/dL (ref 0.0–1.0)
pH: 8 (ref 5.0–8.0)

## 2013-11-08 LAB — CBC
HCT: 28 % — ABNORMAL LOW (ref 36.0–46.0)
HCT: 26.5 % — ABNORMAL LOW (ref 36.0–46.0)
Hemoglobin: 9 g/dL — ABNORMAL LOW (ref 12.0–15.0)
Hemoglobin: 8.9 g/dL — ABNORMAL LOW (ref 12.0–15.0)
MCH: 30.5 pg (ref 26.0–34.0)
MCH: 31.1 pg (ref 26.0–34.0)
MCHC: 32.1 g/dL (ref 30.0–36.0)
MCHC: 33.6 g/dL (ref 30.0–36.0)
MCV: 94.9 fL (ref 78.0–100.0)
MCV: 92.7 fL (ref 78.0–100.0)
Platelets: 393 10*3/uL (ref 150–400)
Platelets: 384 10*3/uL (ref 150–400)
RBC: 2.95 MIL/uL — ABNORMAL LOW (ref 3.87–5.11)
RBC: 2.86 MIL/uL — ABNORMAL LOW (ref 3.87–5.11)
RDW: 16 % — ABNORMAL HIGH (ref 11.5–15.5)
RDW: 16 % — ABNORMAL HIGH (ref 11.5–15.5)
WBC: 19.2 10*3/uL — ABNORMAL HIGH (ref 4.0–10.5)
WBC: 22.3 10*3/uL — ABNORMAL HIGH (ref 4.0–10.5)

## 2013-11-08 LAB — TYPE AND SCREEN
ABO/RH(D): A POS
Antibody Screen: NEGATIVE

## 2013-11-08 LAB — PREALBUMIN: Prealbumin: 7.2 mg/dL — ABNORMAL LOW (ref 17.0–34.0)

## 2013-11-08 LAB — TRIGLYCERIDES: Triglycerides: 102 mg/dL (ref <150)

## 2013-11-08 LAB — URINE MICROSCOPIC-ADD ON

## 2013-11-08 LAB — PHOSPHORUS: Phosphorus: 4.1 mg/dL (ref 2.3–4.6)

## 2013-11-08 LAB — MAGNESIUM: Magnesium: 1.5 mg/dL (ref 1.5–2.5)

## 2013-11-08 LAB — GLUCOSE, CAPILLARY
Glucose-Capillary: 134 mg/dL — ABNORMAL HIGH (ref 70–99)
Glucose-Capillary: 129 mg/dL — ABNORMAL HIGH (ref 70–99)
Glucose-Capillary: 127 mg/dL — ABNORMAL HIGH (ref 70–99)

## 2013-11-08 MED ORDER — FAT EMULSION 20 % IV EMUL
250.0000 mL | INTRAVENOUS | Status: AC
  Administered 2013-11-08: 250 mL via INTRAVENOUS
  Filled 2013-11-08: qty 250

## 2013-11-08 MED ORDER — VANCOMYCIN HCL 500 MG IV SOLR
500.0000 mg | Freq: Two times a day (BID) | INTRAVENOUS | Status: DC
  Administered 2013-11-09 – 2013-11-10 (×4): 500 mg via INTRAVENOUS
  Filled 2013-11-08 (×5): qty 500

## 2013-11-08 MED ORDER — MAGNESIUM SULFATE IN D5W 10-5 MG/ML-% IV SOLN
1.0000 g | Freq: Once | INTRAVENOUS | Status: AC
  Administered 2013-11-08: 1 g via INTRAVENOUS
  Filled 2013-11-08: qty 100

## 2013-11-08 MED ORDER — KCL-LACTATED RINGERS-D5W 20 MEQ/L IV SOLN
INTRAVENOUS | Status: AC
  Filled 2013-11-08: qty 1000

## 2013-11-08 MED ORDER — POTASSIUM CHLORIDE 10 MEQ/100ML IV SOLN
10.0000 meq | INTRAVENOUS | Status: AC
  Administered 2013-11-08 (×2): 10 meq via INTRAVENOUS
  Filled 2013-11-08 (×2): qty 100

## 2013-11-08 MED ORDER — TRACE MINERALS CR-CU-F-FE-I-MN-MO-SE-ZN IV SOLN
INTRAVENOUS | Status: AC
  Administered 2013-11-08: 18:00:00 via INTRAVENOUS
  Filled 2013-11-08: qty 2000

## 2013-11-08 MED ORDER — LIP MEDEX EX OINT
TOPICAL_OINTMENT | CUTANEOUS | Status: AC
  Administered 2013-11-08: 15:00:00
  Filled 2013-11-08: qty 7

## 2013-11-08 MED ORDER — VANCOMYCIN HCL IN DEXTROSE 1-5 GM/200ML-% IV SOLN
1000.0000 mg | Freq: Once | INTRAVENOUS | Status: AC
  Administered 2013-11-08: 1000 mg via INTRAVENOUS
  Filled 2013-11-08: qty 200

## 2013-11-08 MED ORDER — VANCOMYCIN HCL IN DEXTROSE 750-5 MG/150ML-% IV SOLN: 750.0000 mg | Freq: Two times a day (BID) | INTRAVENOUS | Status: DC

## 2013-11-08 MED ORDER — IOHEXOL 350 MG/ML SOLN
100.0000 mL | Freq: Once | INTRAVENOUS | Status: AC | PRN
  Administered 2013-11-08: 100 mL via INTRAVENOUS

## 2013-11-08 MED ORDER — IOHEXOL 300 MG/ML  SOLN: 25.0000 mL | INTRAMUSCULAR | Status: DC

## 2013-11-08 NOTE — Progress Notes (Signed)
ANTIBIOTIC CONSULT NOTE - INITIAL  Pharmacy Consult for:  Vancomycin Indication:   Pneumonia  Allergies  Allergen Reactions  . Codeine Nausea And Vomiting    Patient Measurements: Height: 5\' 4"  (162.6 cm) Weight: 121 lb 11.1 oz (55.2 kg) IBW/kg (Calculated) : 54.7   Vital Signs: Temp: 98 F (36.7 C) (03/13 1325) Temp src: Oral (03/13 1325) BP: 111/64 mmHg (03/13 1325) Pulse Rate: 83 (03/13 1325) Intake/Output from previous day: 03/12 0701 - 03/13 0700 In: 2214.8 [I.V.:1375.5; IV Piggyback:250; TPN:589.3] Out: 3143 [Urine:4150; Emesis/NG output:750; Drains:25; Stool:100] Intake/Output from this shift: Total I/O In: 800.7 [I.V.:244; IV Piggyback:150; TPN:406.7] Out: 1800 [Urine:800; Emesis/NG output:1000]  Labs:  Recent Labs  11/07/13 0455 11/08/13 0417  WBC 15.9* 19.2*  HGB 8.3* 9.0*  PLT 296 393  CREATININE 0.45* 0.39*   Estimated Creatinine Clearance: 63 ml/min (by C-G formula based on Cr of 0.39).    Microbiology: 3/13 - blood and urine cultures pending  Medical History: Past Medical History  Diagnosis Date  . Cancer     lung, hx cervicle    Medications:  Scheduled:  . heparin subcutaneous  5,000 Units Subcutaneous 3 times per day  . insulin aspart  0-9 Units Subcutaneous 4 times per day  . morphine   Intravenous 6 times per day  . pantoprazole (PROTONIX) IV  40 mg Intravenous Q24H  . piperacillin-tazobactam (ZOSYN)  IV  3.375 g Intravenous Q8H   Assessment:  Asked to assist with Vancomycin therapy for this 63 year-old female with metastatic NSCLC and new diagnosis of pneumonia.  WBCs are trending up; blood and urine cultures have been ordered.  Zosyn was ordered on 11/03/13 and is ongoing.  Surgery on 3/8 included drainage of intra-abdominal abscesses, sigmoid colectomy, and creation of colostomy for perforated sigmoid colon.    Goals of Therapy:   Vancomycin trough levels 15-20 mcg/ml  Eradication of infection  Plan:   Vancomycin 1000  mg x 1, then 500 mg IV every 12 hours.  Levels as needed to guide dosing.  Follow for culture results  EckleyPh. 11/08/2013 4:45 PM

## 2013-11-08 NOTE — Consult Note (Addendum)
[facility] Medical Consultation  Alexis [name] VEL:[id number] DOB: December 18, 1950 DOA: 11/03/2013   PCP: [name], MD    Requesting physician: Dr. Rosendo Gros Date of consultation: 11/08/2013 Reason for consultation: Elevated WBC, and pneumonia  Chief Complaint: Abdominal pain  HPI: Alexis [name] is a 63 y.o. 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 She presented to the hospital with abdominal distention and pain and was found to have pneumoperitoneum. She underwent emergent surgery on March 8 and was found to have perforated sigmoid colon with multiple intra-abdominal abscess ease. She underwent drainage of these abscesses, sigmoid colectomy and colostomy. She's currently on TPN. She is getting radiation treatments on a daily basis for her lung cancer and brain mets. Over the last 2 days it was noted by the surgical staff that her WBC was increasing. She underwent a CT scan of her chest, Abdomen, pelvis and was noted to have small pleural effusion and possibility of an infiltrate in the right lower lung. The abscesses in the abdomen appeared to be improving. Patient at this time denies any cough or shortness of breath. No chest pain. She has been ambulating with the help of a walker without any dyspnea. Denies any skin rashes. No joint pains. No nausea, vomiting. She does have minimal abdominal pain, but that is also improving. Denies any pain with the urination. No diarrhea has been noted. Although she does have a colostomy.   Home Medications: Prior to Admission medications   Medication Sig Start Date End Date Taking? Authorizing Provider  ALPRAZolam Duanne Moron) 0.5 MG tablet One four times a day as needed for nerves or pain 09/09/13  Yes Tanda Rockers, MD  emollient (BIAFINE) cream Apply 1 application topically daily. Apply daily after rad tx 11/01/13  Yes Marye Round, MD  fluconazole  (DIFLUCAN) 100 MG tablet Take 1 tablet (100 mg total) by mouth daily. Take 2 tabs x 1, then 1 tab QD x 9 additional days. 11/01/13  Yes Marye Round, MD  HYDROcodone-acetaminophen [facility]) 10-325 MG per tablet Take 1 tablet by mouth every 4 (four) hours as needed. 09/03/13  Yes Tanda Rockers, MD  omeprazole (PRILOSEC) 20 MG capsule Take 20 mg by mouth daily.   Yes Historical Provider, MD  ondansetron (ZOFRAN) 8 MG tablet Take 1 tablet (8 mg total) by mouth every 8 (eight) hours as needed for nausea or vomiting. 10/31/13  Yes Marye Round, MD   Current Medications:  Scheduled: . heparin subcutaneous  5,000 Units Subcutaneous 3 times per day  . insulin aspart  0-9 Units Subcutaneous 4 times per day  . morphine   Intravenous 6 times per day  . pantoprazole (PROTONIX) IV  40 mg Intravenous Q24H  . piperacillin-tazobactam (ZOSYN)  IV  3.375 g Intravenous Q8H  . vancomycin  1,000 mg Intravenous Once  . [START ON 11/09/2013] vancomycin  750 mg Intravenous Q12H   Continuous: . dextrose 5% lactated ringers with KCl 20 mEq/L 30 mL/hr at 11/07/13 2300  . dextrose 5% lactated ringers with KCl 20 mEq/L    . Marland KitchenTPN (CLINIMIX-E) Adult 40 mL/hr at 11/07/13 1805   And  . fat emulsion 250 mL (11/07/13 1804)  . Marland KitchenTPN (CLINIMIX-E) Adult     And  . fat emulsion     CHE:NIDPOEUMPNTIRWE, diphenhydrAMINE, naloxone, ondansetron (ZOFRAN) IV, ondansetron (ZOFRAN) IV, ondansetron, phenol, sodium chloride, sodium chloride  Allergies:  Allergies  Allergen Reactions  .  Codeine Nausea And Vomiting    Past Medical History: Past Medical History  Diagnosis Date  . Cancer     lung, hx cervicle    Past Surgical History  Procedure Laterality Date  . Video bronchoscopy Bilateral 08/30/2013    Procedure: VIDEO BRONCHOSCOPY WITH FLUORO;  Surgeon: Tanda Rockers, MD;  Location: [facility];  Service: Cardiopulmonary;  Laterality: Bilateral;  . Abdominal hysterectomy    . Laparotomy N/A 11/03/2013    Procedure:  EXPLORATORY LAPAROTOMY, DRAINAGE OF INTRA  ABDOMINAL ABSCESSES, MOBILIZATION OF SPLENIC FLEXURE, SIGMOID COLECTOMY WITH COLOSTOMY;  Surgeon: Odis Hollingshead, MD;  Location: [facility];  Service: General;  Laterality: N/A;    Social History:  Patient lives with her husband. She quit smoking about 10 weeks ago when she was diagnosed with cancer. She is a 40 pack year history of smoking and at the very least. No alcohol. She was independent with daily activities prior to this hospitalization.  Family History:  Family History  Problem Relation Age of Onset  . Emphysema Father     smoked  . Lung cancer Father     smoked  . Cancer Mother   . Hypertension Mother   . COPD Mother      Review of Systems - History obtained from the patient General ROS: positive for  - fatigue Psychological ROS: positive for - anxiety Ophthalmic ROS: negative ENT ROS: negative Allergy and Immunology ROS: negative Hematological and Lymphatic ROS: negative Endocrine ROS: negative Respiratory ROS: as in hpi Cardiovascular ROS: no chest pain or dyspnea on exertion Gastrointestinal ROS: as in hpi Genito-Urinary ROS: no dysuria, trouble voiding, or hematuria Musculoskeletal ROS: negative Neurological ROS: no TIA or stroke symptoms Dermatological ROS: negative  Physical Examination: Filed Vitals:   11/08/13 0550 11/08/13 0800 11/08/13 1200 11/08/13 1325  BP: 116/71   111/64  Pulse: 69   83  Temp: 98.6 F (37 C)   98 F (36.7 C)  TempSrc: Oral   Oral  Resp: 16  13 16   Height:      Weight:      SpO2: 98% 98% 97% 97%    General appearance: alert, cooperative, appears older than stated age and no distress Head: Normocephalic, without obvious abnormality, atraumatic Eyes: conjunctivae/corneas clear. PERRL, EOM's intact. Throat: lips, mucosa, and tongue normal; teeth and gums normal Resp: few crackles at right base. no wheezing Cardio: regular rate and rhythm, S1, S2 normal, no murmur, click, rub or  gallop GI: soft, tender from recent surgery. Dressing noted. Ostomy noted. BS present. Extremities: extremities normal, atraumatic, no cyanosis or edema Pulses: 2+ and symmetric Skin: Skin color, texture, turgor normal. No rashes or lesions Lymph nodes: Cervical, supraclavicular, and axillary nodes normal. Neurologic: Alert and oriented x 3. No focal deficits.  Laboratory Data: Results for orders placed during the hospital encounter of 11/03/13 (from the past 48 hour(s))  GLUCOSE, CAPILLARY     Status: None   Collection Time    11/06/13  9:20 PM      Result Value Ref Range   Glucose-Capillary 88  70 - 99 mg/dL  CBC     Status: Abnormal   Collection Time    11/07/13  4:55 AM      Result Value Ref Range   WBC 15.9 (*) 4.0 - 10.5 K/uL   RBC 2.71 (*) 3.87 - 5.11 MIL/uL   Hemoglobin 8.3 (*) 12.0 - 15.0 g/dL   HCT 25.7 (*) 36.0 - 46.0 %   MCV 94.8  78.0 - 100.0 fL   MCH 30.6  26.0 - 34.0 pg   MCHC 32.3  30.0 - 36.0 g/dL   RDW 16.0 (*) 11.5 - 15.5 %   Platelets 296  150 - 400 K/uL  BASIC METABOLIC PANEL     Status: Abnormal   Collection Time    11/07/13  4:55 AM      Result Value Ref Range   Sodium 139  137 - 147 mEq/L   Potassium 3.9  3.7 - 5.3 mEq/L   Chloride 98  96 - 112 mEq/L   CO2 34 (*) 19 - 32 mEq/L   Glucose, Bld 97  70 - 99 mg/dL   BUN 6  6 - 23 mg/dL   Creatinine, Ser 0.45 (*) 0.50 - 1.10 mg/dL   Calcium 8.2 (*) 8.4 - 10.5 mg/dL   GFR calc non Af Amer >90  >90 mL/min   GFR calc Af Amer >90  >90 mL/min   Comment: (NOTE)     The eGFR has been calculated using the CKD EPI equation.     This calculation has not been validated in all clinical situations.     eGFR's persistently <90 mL/min signify possible Chronic Kidney     Disease.  GLUCOSE, CAPILLARY     Status: Abnormal   Collection Time    11/07/13 11:05 PM      Result Value Ref Range   Glucose-Capillary 123 (*) 70 - 99 mg/dL  CBC     Status: Abnormal   Collection Time    11/08/13  4:17 AM      Result Value  Ref Range   WBC 19.2 (*) 4.0 - 10.5 K/uL   RBC 2.95 (*) 3.87 - 5.11 MIL/uL   Hemoglobin 9.0 (*) 12.0 - 15.0 g/dL   HCT 28.0 (*) 36.0 - 46.0 %   MCV 94.9  78.0 - 100.0 fL   MCH 30.5  26.0 - 34.0 pg   MCHC 32.1  30.0 - 36.0 g/dL   RDW 16.0 (*) 11.5 - 15.5 %   Platelets 393  150 - 400 K/uL   Comment: REPEATED TO VERIFY     DELTA CHECK NOTED  COMPREHENSIVE METABOLIC PANEL     Status: Abnormal   Collection Time    11/08/13  4:17 AM      Result Value Ref Range   Sodium 139  137 - 147 mEq/L   Potassium 3.7  3.7 - 5.3 mEq/L   Chloride 96  96 - 112 mEq/L   CO2 33 (*) 19 - 32 mEq/L   Glucose, Bld 130 (*) 70 - 99 mg/dL   BUN 6  6 - 23 mg/dL   Creatinine, Ser 0.39 (*) 0.50 - 1.10 mg/dL   Calcium 8.3 (*) 8.4 - 10.5 mg/dL   Total Protein 5.2 (*) 6.0 - 8.3 g/dL   Albumin 1.6 (*) 3.5 - 5.2 g/dL   AST 12  0 - 37 U/L   ALT 14  0 - 35 U/L   Alkaline Phosphatase 83  39 - 117 U/L   Total Bilirubin 0.5  0.3 - 1.2 mg/dL   GFR calc non Af Amer >90  >90 mL/min   GFR calc Af Amer >90  >90 mL/min   Comment: (NOTE)     The eGFR has been calculated using the CKD EPI equation.     This calculation has not been validated in all clinical situations.     eGFR's persistently <90 mL/min signify possible Chronic Kidney  Disease.  PREALBUMIN     Status: Abnormal   Collection Time    11/08/13  4:17 AM      Result Value Ref Range   Prealbumin 7.2 (*) 17.0 - 34.0 mg/dL   Comment: Performed at [place]     Status: None   Collection Time    11/08/13  4:17 AM      Result Value Ref Range   Magnesium 1.5  1.5 - 2.5 mg/dL  PHOSPHORUS     Status: None   Collection Time    11/08/13  4:17 AM      Result Value Ref Range   Phosphorus 4.1  2.3 - 4.6 mg/dL  TRIGLYCERIDES     Status: None   Collection Time    11/08/13  4:17 AM      Result Value Ref Range   Triglycerides 102  <150 mg/dL   Comment: Performed at [facility]  DIFFERENTIAL     Status: Abnormal   Collection Time     11/08/13  4:17 AM      Result Value Ref Range   Neutrophils Relative % 88 (*) 43 - 77 %   Lymphocytes Relative 6 (*) 12 - 46 %   Monocytes Relative 5  3 - 12 %   Eosinophils Relative 1  0 - 5 %   Basophils Relative 0  0 - 1 %   Neutro Abs 16.8 (*) 1.7 - 7.7 K/uL   Lymphs Abs 1.2  0.7 - 4.0 K/uL   Monocytes Absolute 1.0  0.1 - 1.0 K/uL   Eosinophils Absolute 0.2  0.0 - 0.7 K/uL   Basophils Absolute 0.0  0.0 - 0.1 K/uL   WBC Morphology TOXIC GRANULATION     Comment: MODERATE LEFT SHIFT (>5% METAS AND MYELOS,OCC PRO NOTED)  TYPE AND SCREEN     Status: None   Collection Time    11/08/13  5:00 AM      Result Value Ref Range   ABO/RH(D) A POS     Antibody Screen NEG     Sample Expiration 11/11/2013    GLUCOSE, CAPILLARY     Status: Abnormal   Collection Time    11/08/13  6:14 AM      Result Value Ref Range   Glucose-Capillary 134 (*) 70 - 99 mg/dL  GLUCOSE, CAPILLARY     Status: Abnormal   Collection Time    11/08/13 12:21 PM      Result Value Ref Range   Glucose-Capillary 129 (*) 70 - 99 mg/dL  URINALYSIS, ROUTINE W REFLEX MICROSCOPIC     Status: Abnormal   Collection Time    11/08/13  1:59 PM      Result Value Ref Range   Color, Urine YELLOW  YELLOW   APPearance CLEAR  CLEAR   Specific Gravity, Urine 1.039 (*) 1.005 - 1.030   pH 8.0  5.0 - 8.0   Glucose, UA NEGATIVE  NEGATIVE mg/dL   Hgb urine dipstick TRACE (*) NEGATIVE   Bilirubin Urine NEGATIVE  NEGATIVE   Ketones, ur NEGATIVE  NEGATIVE mg/dL   Protein, ur NEGATIVE  NEGATIVE mg/dL   Urobilinogen, UA 0.2  0.0 - 1.0 mg/dL   Nitrite NEGATIVE  NEGATIVE   Leukocytes, UA NEGATIVE  NEGATIVE  URINE MICROSCOPIC-ADD ON     Status: None   Collection Time    11/08/13  1:59 PM      Result Value Ref Range   Squamous Epithelial / LPF  RARE  RARE   WBC, UA 0-2  <3 WBC/hpf   RBC / HPF 0-2  <3 RBC/hpf   Bacteria, UA RARE  RARE    Imaging Studies: Ct Angio Chest Pe W/cm &/or Wo Cm  11/08/2013   CLINICAL DATA:  Known right lung  malignancy, elevated white blood cell count, clinical suspicion of pulmonary embolism.  EXAM: CT ANGIOGRAPHY CHEST WITH CONTRAST  TECHNIQUE: Multidetector CT imaging of the chest was performed using the standard protocol during bolus administration of intravenous contrast. Multiplanar CT image reconstructions and MIPs were obtained to evaluate the vascular anatomy.  CONTRAST:  178m OMNIPAQUE IOHEXOL 350 MG/ML SOLN  COMPARISON:  DG CHEST 2 VIEW dated 11/06/2013; CT CHEST W/ CM dated 08/19/2013  FINDINGS: Contrast within the pulmonary arterial tree is normal in appearance. There are no filling defects to suggest an acute pulmonary embolism. The caliber of the thoracic aorta is normal. There is no evidence of a false lumen. The cardiac chambers are mildly enlarged. No bulky mediastinal lymph nodes are demonstrated. There is right hilar lymph node on image 42 which measures 1.3 cm in short axis which has enlarged from 0.9 cm on the previous study. A precarinal lymph node measures 7 mm in short axis. There is no subcarinal lymphadenopathy. There is a small right pleural effusion. There is no left pleural effusion or pericardial effusion.  At lung window settings again demonstrated are bilateral bullous emphysematous changes. A right upper lobe mass is again demonstrated which measures 3.5 cm AP x 3.6 cm transversely has not significantly changed in size since the previous study. There is considerable increased interstitial density posteriorly and inferiorly to this mass extending to the right hilum. This has progressed since the previous study. In the right lower lobe there is a new area of atelectasis with the aforementioned small pleural effusion. The left lung exhibits stable emphysematous changes. No interstitial or alveolar infiltrate is demonstrated. Minimal subsegmental atelectasis at the left lung base is present.  Within the upper abdomen the liver exhibits decreased density with consistent with fatty  infiltration. No focal mass is demonstrated within the visualized portions of the liver. The adrenal glands are not included in the field of view. The esophagogastric tube tip is not included in the field of view.  Review of the MIP images confirms the above findings.  IMPRESSION: 1. There is no evidence of an acute pulmonary embolism or acute thoracic aortic pathology. 2. The known right upper lobe mass has not significantly changed in size. There is increased subsegmental atelectasis or lymphedema in the lung parenchyma inferior to the lung mass as compared to the previous study. A right hilar lymph node has become more conspicuous since the previous study but is maximal short axis dimension is 1.3 cm. 3. There is a new small right pleural effusion and new right lower lobe consolidation posteriorly worrisome for pneumonia. There is minimal subsegmental atelectasis at the left lung base.   Electronically Signed   By: David  JMartinique  On: 11/08/2013 11:01   Ct Abdomen Pelvis W Contrast  11/08/2013   CLINICAL DATA:  Elevated white blood cell count  EXAM: CT ABDOMEN AND PELVIS WITH CONTRAST  TECHNIQUE: Multidetector CT imaging of the abdomen and pelvis was performed using the standard protocol following bolus administration of intravenous contrast.  CONTRAST:  1011mOMNIPAQUE IOHEXOL 350 MG/ML SOLN intravenously. The patient also received oral contrast material.  COMPARISON:  CT ABD/PELVIS W CM dated 11/03/2013  FINDINGS: The patient has undergone  sigmoid resection with creation of a left-sided colostomy. There is a percutaneously placed drainage catheter in place within the pelvis. There is a tiny amount of free air visible on images 63-66 adjacent to the open midline incision. The partially contrast filled loops of small bowel exhibit no evidence of obstruction and only a mild ileus type pattern. There is mild thickening of small bowel loops in the right lower quadrant of the abdomen. Small interloop abscesses are  demonstrated: On image 66 there is a 2.5 cm dimension fluid collection. On image 70 there is a 1.9 cm diameter fluid collection. On image 69 and there is a 3.7 cm diameter fluid collection. There is free fluid and a small amount of gas in the left paracolic gutter demonstrated best on images 42-52.  The colon is partially distended with contrast and gas. The remnant rectosigmoid colon is partially distended with fluid. There is gas in the perirectal soft tissues on images 64 through 83. The drainage catheter lies immediately anterior and superior to the gas. The urinary bladder is moderately distended and contains gas as well as fluid. The uterus is surgically absent. No adnexal masses are demonstrated.  The liver exhibits no focal mass nor significant ductal dilation. The gallbladder is mildly distended. A small amount of extraluminal gas lies adjacent to the gallbladder. The stomach is nondistended and a nasogastric tube is present with the tip in the midbody of the stomach. The spleen is not enlarged. The pancreas, adrenal glands, and kidneys are normal in appearance. The caliber of the abdominal aorta is normal.  The lumbar spine and bony pelvis exhibit no acute abnormalities. There is mildly increased density in the subcutaneous fat diffusely likely secondary to the patient's volume and protein status. There is a small right pleural effusion and trace left pleural effusion. There is right lower lobe atelectasis or pneumonia.  IMPRESSION: 1. Since the previous study the patient has undergone sigmoid colectomy and creation of left-sided colostomy for perforated diverticulitis. A drainage catheter has been placed in the pelvis. Previously described interloop abscesses are again demonstrated but appear to have decreased in size. There remains free fluid and free air within the peritoneal cavity. 2. There is no evidence of a small or large bowel obstruction. Mild thickening of small bowel loops in the right lower  quadrant of the abdomen is demonstrated. There is a moderate amount of stool and gas in the distal most portion of the descending colon just proximal to the colostomy. 3. The kidneys exhibit no evidence of obstruction or inflammatory changes. There is gas as well as urine within the urinary bladder. This may be due to recent catheterization but the possibility of infection or fistulization is raised. 4. There is no acute hepatobiliary abnormality. 5. There small bilateral pleural effusions and there is right lower lobe atelectasis or pneumonia.   Electronically Signed   By: David  Martinique   On: 11/08/2013 14:02    Impression/Recommendations  Active Problems:   Pneumoperitoneum   Perforation of sigmoid colon   Leukocytosis, unspecified   HCAP (healthcare-associated pneumonia)   This is a 63 year old, Caucasian female, with metastatic lung cancer and recent perforation of sigmoid colon with intraabdominal abscesses requiring extensive abdominal surgery. She's noted to have elevated WBC, possibility of pneumonia in the right lung.  #1 leukocytosis: No clear etiology for the same. Recent CT scan suggested possibility of pneumonia. She is however, asymptomatic. She doesn't have any obvious rash. The urinalysis was unremarkable. Other possibilities include C. difficile. The  intra-abdominal abscesses appeared to have decreased in size. Check for C. difficile. Treat possible pneumonia as below.  #2 possible healthcare associated pneumonia: She is already on Zosyn. Vancomycin will be added. Blood cultures will be obtained. Incentive spirometry. Oxygen as needed.  #3 metastatic lung cancer: Management as per oncology and radiation oncologist.  #4 perforation of sigmoid colon status post sigmoid colectomy with several intra-abdominal abscesses: Management per surgery.  #5 Anemia: Likely from recent acute illness. Per RN some blood noted through colostomy. Hgb appears to stable. Continue to monitor.  [facility]  will followup again tomorrow. Please contact me if I can be of assistance in the meanwhile. Thank you for this consultation.  [facility] Pager [phone]  If 7PM-7AM, please contact night-coverage.  [email or web address] Password [facility]  11/08/2013, 5:14 PM

## 2013-11-08 NOTE — Progress Notes (Signed)
Physical Therapy Treatment Patient Details Name: Alexis [name] MRN: [id number] DOB: [birth year] Today's Date: 11/08/2013 Time: 3329-5188 PT Time Calculation (min): 30 min  PT Assessment / Plan / Recommendation  History of Present Illness 63 yo female admitted with emergent exp lap 3/8, multiple abscesses. Hx of met lung cancer, HTN, anemia   PT Comments   Assisted pt OOb to [facility] to void then amb in hallway.  Pt progressing well.   Follow Up Recommendations  Home health PT;Supervision/Assistance - 24 hour     Does the patient have the potential to tolerate intense rehabilitation     Barriers to Discharge        Equipment Recommendations  None recommended by PT    Recommendations for Other Services    Frequency Min 3X/week   Progress towards PT Goals Progress towards PT goals: Progressing toward goals  Plan      Precautions / Restrictions Precautions Precautions: Fall Precaution Comments: multiple lines/leads, drains, NG tube Restrictions Weight Bearing Restrictions: No   Pertinent Vitals/Pain C/o "pulling" on her ABD during gait   Mobility  Bed Mobility Overal bed mobility: Needs Assistance Bed Mobility: Supine to Sit Supine to sit: Min guard General bed mobility comments: use of rail and increased time MinGuard assist for multiple lines/tubing Transfers Overall transfer level: Needs assistance Equipment used: Rolling walker (2 wheeled) Transfers: Sit to/from Stand Sit to Stand: Min guard General transfer comment: assisted from bed to [facility] with good use of hands and safety tech.  Did assist pt with hygiene due to slightl;y unsteady balance one handed.  Ambulation/Gait Ambulation/Gait assistance: Min guard;Min assist Ambulation Distance (Feet): 500 Feet Assistive device: Rolling walker (2 wheeled) Gait Pattern/deviations: Step-through pattern;Decreased stride length Gait velocity: WFL General Gait Details: used RW for increased staedyness.  Pt c/o Mod ABD discomfort  "pulling" during gait.      PT Goals (current goals can now be found in the care plan section)    Visit Information  Last PT Received On: 11/08/13 Assistance Needed: +1 History of Present Illness: 63 yo female admitted with emergent exp lap 3/8, multiple abscesses. Hx of met lung cancer, HTN, anemia    Subjective Data      Cognition       Balance     End of Session PT - End of Session Equipment Utilized During Treatment: Gait belt Activity Tolerance: Patient tolerated treatment well Patient left: in chair;with call bell/phone within reach;with nursing/sitter in room   Rica Koyanagi  PTA [facility]  Acute  Rehab Pager      [phone]

## 2013-11-08 NOTE — Progress Notes (Signed)
[facility] Therapy Treatment Record Phone [facility] Therapy Treatment Record Phone [phone]   Radiation Therapy was administered to Alexis [name] on: 11/08/2013  3:57 PM and was treatment # 6 out of a planned course of 25 treatments.

## 2013-11-08 NOTE — Progress Notes (Signed)
Department of Radiation Oncology  Phone:  [phone] Fax:        [phone]   INPATIENT  Weekly Treatment Note    Name: Alexis [name] Date: 11/08/2013 MRN: [id number] DOB: [birth year]   Current dose: 12 Gy  Current fraction: 6   MEDICATIONS: No current facility-administered medications for this encounter.   No current outpatient prescriptions on file.   Facility-Administered Medications Ordered in Other Encounters  Medication Dose Route Frequency Provider Last Rate Last Dose  . dextrose 5% in lactated ringers with KCl 20 mEq/L infusion   Intravenous Continuous Julieta Bellini Absher, RPH      . diphenhydrAMINE (BENADRYL) injection 12.5 mg  12.5 mg Intravenous Q6H PRN Odis Hollingshead, MD       Or  . diphenhydrAMINE (BENADRYL) 12.5 MG/5ML elixir 12.5 mg  12.5 mg Oral Q6H PRN Odis Hollingshead, MD      . TPN (CLINIMIX-E) Adult   Intravenous Continuous TPN Julieta Bellini Absher, RPH 50 mL/hr at 11/08/13 1746     And  . fat emulsion 20 % infusion 250 mL  250 mL Intravenous Continuous TPN Randall K Absher, RPH 10 mL/hr at 11/08/13 1745 250 mL at 11/08/13 1745  . heparin injection 5,000 Units  5,000 Units Subcutaneous 3 times per day Odis Hollingshead, MD   5,000 Units at 11/08/13 1435  . insulin aspart (novoLOG) injection 0-9 Units  0-9 Units Subcutaneous 4 times per day Joycelyn Rua, RPH   1 Units at 11/08/13 1755  . morphine 1 MG/ML PCA injection   Intravenous 6 times per day Odis Hollingshead, MD   12 mg at 11/08/13 2118  . naloxone [facility]) injection 0.4 mg  0.4 mg Intravenous PRN Odis Hollingshead, MD       And  . sodium chloride 0.9 % injection 9 mL  9 mL Intravenous PRN Odis Hollingshead, MD      . ondansetron [facility]) tablet 4 mg  4 mg Oral Q6H PRN Odis Hollingshead, MD       Or  . ondansetron [facility]) injection 4 mg  4 mg Intravenous Q6H PRN Odis Hollingshead, MD      . ondansetron [facility]) injection 4 mg  4 mg Intravenous Q6H PRN Odis Hollingshead, MD      .  pantoprazole (PROTONIX) injection 40 mg  40 mg Intravenous Q24H Odis Hollingshead, MD   40 mg at 11/07/13 2219  . phenol (CHLORASEPTIC) mouth spray 1 spray  1 spray Mouth/Throat PRN Ralene Ok, MD   1 spray at 11/05/13 0818  . piperacillin-tazobactam (ZOSYN) IVPB 3.375 g  3.375 g Intravenous Q8H Odis Hollingshead, MD   3.375 g at 11/08/13 1705  . sodium chloride 0.9 % injection 10-40 mL  10-40 mL Intracatheter PRN Ralene Ok, MD   30 mL at 11/08/13 1753  . [START ON 11/09/2013] vancomycin (VANCOCIN) 500 mg in sodium chloride 0.9 % 100 mL IVPB  500 mg Intravenous Q12H Posey Rea, RPH         ALLERGIES: Codeine   LABORATORY DATA:  Lab Results  Component Value Date   WBC 22.3* 11/08/2013   HGB 8.9* 11/08/2013   HCT 26.5* 11/08/2013   MCV 92.7 11/08/2013   PLT 384 11/08/2013   Lab Results  Component Value Date   NA 139 11/08/2013   K 3.7 11/08/2013   CL 96 11/08/2013   CO2 33* 11/08/2013   Lab Results  Component Value Date  ALT 14 11/08/2013   AST 12 11/08/2013   ALKPHOS 83 11/08/2013   BILITOT 0.5 11/08/2013     NARRATIVE: Alexis [name] was seen today for weekly treatment management. The chart was checked and the patient's films were reviewed. The patient is glad to have restarted treatment again. She states she feels better after surgery. No specific complaints regarding her radiation treatment.  PHYSICAL EXAMINATION: vitals were not taken for this visit.     the patient was seen in the treatment room. No acute distress. In good spirits.  ASSESSMENT: The patient is doing satisfactorily with treatment.  PLAN: We will continue with the patient's radiation treatment as planned.

## 2013-11-08 NOTE — Consult Note (Signed)
WOC ostomy follow up Stoma type/location:  LLQ End Colostomy Stomal assessment/size:  Oval shaped, flush, necrotic appearing stoma.  Dr Rosendo Gros has been in to assess this AM.  Bloody output in bag.  No flatus or stool.  Peristomal assessment: Intact Treatment options for stomal/peristomal skin: 2 inch barrier ring used to create convexity.  1 piece pouch applied.  Spouse is not at bedside. Patient states he works second shift.   Patient agreed to have spouse at hospital 11/10/13 for further teaching.  Output Bloody, liquid Ostomy pouching: 1pc with barrier ring Education provided:  Pouch had been cut too large at bedside using previous pattern.  Instructed to measure stoma at each pouch change as the shape and size may change.  Since using barrier ring, the pouch can still be used. Emotional support given regarding still enjoying activities such as shopping and yard work with an ostomy. Patient anxious regarding lung CA diagnosis and treatment.  Enrolled patient in [place] Start Discharge program: Yes WOC will follow along with you for ostomy care and teaching.   Domenic Moras RN BSN [place] Pager [phone]

## 2013-11-08 NOTE — Progress Notes (Signed)
I have seen and examined the pt and agree with PA-Jenning's progress note. CT CAP to look for any infectious causes to increasing WBC Cont' abx Ostomy was digitialized- pink and patent deeper than skin level with some bleeding.  Ostomy is snug at fascia level Await good ostomy output Con't TPN

## 2013-11-08 NOTE — Progress Notes (Signed)
5 Days Post-Op  Subjective: She says she feels weak, ostomy is still just bloody looking with a little cloudy appearance now.  The ostomy still looks necrotic.  She doesn't complain of SOB, she did have a radiation rx yesterday. They got her up and walked her this AM.   Objective: Vital signs in last 24 hours: Temp:  [97.7 F (36.5 C)-98.8 F (37.1 C)] 98.6 F (37 C) (03/13 0550) Pulse Rate:  [64-80] 69 (03/13 0550) Resp:  [12-18] 16 (03/13 0550) BP: (109-140)/(65-79) 116/71 mmHg (03/13 0550) SpO2:  [97 %-100 %] 98 % (03/13 0550) Last BM Date: 11/07/13 PO - none 750 from the NG yesterday. TNA started last evening Drain 25 ml Stool 100 ml  Afebrile, VSS WBC continues to rise Intake/Output from previous day: 03/12 0701 - 03/13 0700 In: 2214.8 [I.V.:1375.5; IV Piggyback:250; TPN:589.3] Out: 0093 [Urine:4150; Emesis/NG output:750; Drains:25; Stool:100] Intake/Output this shift:    General appearance: alert, cooperative, no distress and tired and frail. Resp: BS are diminished on both side, more right than left GI: Open wound is OK, a little fat necrosis at the base mid portion, but otherwise OK.  the Ostomy remains dark and necrotic looking.  BS are hypoactive.  The ostomy has some dark redish, now more cloudy colored fluid, but not much gas.  Lab Results:   Recent Labs  11/07/13 0455 11/08/13 0417  WBC 15.9* 19.2*  HGB 8.3* 9.0*  HCT 25.7* 28.0*  PLT 296 393    BMET  Recent Labs  11/07/13 0455 11/08/13 0417  NA 139 139  K 3.9 3.7  CL 98 96  CO2 34* 33*  GLUCOSE 97 130*  BUN 6 6  CREATININE 0.45* 0.39*  CALCIUM 8.2* 8.3*   PT/INR No results found for this basename: LABPROT, INR,  in the last 72 hours   Recent Labs Lab 11/03/13 0304 11/08/13 0417  AST 17 12  ALT 20 14  ALKPHOS 104 83  BILITOT 0.8 0.5  PROT 7.1 5.2*  ALBUMIN 2.4* 1.6*     Lipase     Component Value Date/Time   LIPASE 8* 11/03/2013 0304     Studies/Results: Dg Chest 2  View  11/06/2013   CLINICAL DATA Shortness of breath.  EXAM CHEST  2 VIEW  FINDINGS Right upper lobe mass lesion again noted. There is a new dense right upper lobe infiltrate consistent with pneumonia. Bibasilar atelectasis noted. Questionable pulmonary nodule left mid lung field noted. Close follow-up chest x-rays suggested for continued evaluation. Heart size normal. NG tube noted with tip projected over upper portion of stomach, more distal placement should be considered. No acute bony abnormality.  IMPRESSION 1. Persistent right upper lobe mass. 2. Dense right upper lobe infiltrate consistent with pneumonia. 3. Bibasilar atelectasis. Cannot exclude left lower lobe pulmonary nodule, close follow-up chest x-ray is suggested. 4. NG tube noted with tip projected over the upper stomach, more distal placement should be considered.  SIGNATURE  Electronically Signed   By: Marcello Moores  Register   On: 11/06/2013 11:25    Medications: . heparin subcutaneous  5,000 Units Subcutaneous 3 times per day  . insulin aspart  0-9 Units Subcutaneous 4 times per day  . magnesium sulfate 1 - 4 g bolus IVPB  1 g Intravenous Once  . morphine   Intravenous 6 times per day  . pantoprazole (PROTONIX) IV  40 mg Intravenous Q24H  . piperacillin-tazobactam (ZOSYN)  IV  3.375 g Intravenous Q8H  . potassium chloride  10 mEq  Intravenous Q1 Hr x 2    Assessment/Plan Perforated sigmoid colon with multiple abscesses  Emergent exploratory laparotomy, drainage of intra-abdominal abscesses, mobilization of splenic flexure, sigmoid colectomy, colostomy---Dr. Zella Richer 11/03/13  Hx metastatic lung cancer with thoracic radiotherapy--Dr. [name]  She has been skipping treatments because she felt so bad, she had treatment 5/25 yesterday afternoon COPD/Tobacco use hx  Hypertension  Anemia, this is new  Deconditioning and Malnutrition (prealbumin 6.7 and started on TNA 11/07/13) Post op ileus Leukocytoisis   Plan:  I will check her urine and  urine culture, she has been on Zosyn for the last 5 days. CxR on 3/11, shows RUL infiltrate, and bilateral atelectasis. She is on daily heparin for DVT. Discuss with Dr. Rosendo Gros.  We will look at ostomy with bag change later this AM.    LOS: 5 days    Thaxton Pelley 11/08/2013

## 2013-11-08 NOTE — Progress Notes (Signed)
[place] NOTE   Pharmacy Consult for TNA Indication: prolonged post-operative ileus  Allergies  Allergen Reactions  . Codeine Nausea And Vomiting    Patient Measurements: Height: 5\' 4"  (162.6 cm) Weight: 121 lb 11.1 oz (55.2 kg) IBW/kg (Calculated) : 54.7  Vital Signs: Temp: 98.6 F (37 C) (03/13 0550) Temp src: Oral (03/13 0550) BP: 116/71 mmHg (03/13 0550) Pulse Rate: 69 (03/13 0550) Intake/Output from previous day: 03/12 0701 - 03/13 0700 In: 2214.8 [I.V.:1375.5; IV Piggyback:250; TPN:589.3] Out: 4270 [Urine:3950; Emesis/NG output:750; Drains:25; Stool:100] Intake/Output from this shift: Total I/O In: 1174.8 [I.V.:335.5; IV Piggyback:250; TPN:589.3] Out: 2770 [Urine:2300; Emesis/NG output:350; Drains:20; Stool:100]  Labs:  Recent Labs  11/07/13 0455 11/08/13 0417  WBC 15.9* 19.2*  HGB 8.3* 9.0*  HCT 25.7* 28.0*  PLT 296 393     Recent Labs  11/06/13 1130 11/07/13 0455 11/08/13 0417  NA  --  139 139  K  --  3.9 3.7  CL  --  98 96  CO2  --  34* 33*  GLUCOSE  --  97 130*  BUN  --  6 6  CREATININE  --  0.45* 0.39*  CALCIUM  --  8.2* 8.3*  MG  --   --  1.5  PHOS  --   --  4.1  PROT  --   --  5.2*  ALBUMIN  --   --  1.6*  AST  --   --  12  ALT  --   --  14  ALKPHOS  --   --  83  BILITOT  --   --  0.5  PREALBUMIN 6.7*  --   --   Corrected calcium 3/13: 10.2 Estimated Creatinine Clearance: 63 ml/min (by C-G formula based on Cr of 0.39).    Recent Labs  11/06/13 2120 11/07/13 2305  GLUCAP 36 123*    Medical History: Past Medical History  Diagnosis Date  . Cancer     lung, hx cervicle    Medications:  Scheduled:  . heparin subcutaneous  5,000 Units Subcutaneous 3 times per day  . insulin aspart  0-9 Units Subcutaneous 4 times per day  . morphine   Intravenous 6 times per day  . pantoprazole (PROTONIX) IV  40 mg Intravenous Q24H  . piperacillin-tazobactam (ZOSYN)  IV  3.375 g Intravenous Q8H   Infusions:  .  dextrose 5% lactated ringers with KCl 20 mEq/L 30 mL/hr at 11/07/13 2300  . Marland KitchenTPN (CLINIMIX-E) Adult 40 mL/hr at 11/07/13 1805   And  . 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  On 3/8 underwent emergency exploratory laparotomy, drainage of intra-abdominal abscesses, mobilization of splenic flexure, sigmoid colectomy, colostomy.   Bowel function has not yet returned and prealbumin is 6.7.  TNA initiated 3/12 with pharmacy assistance requested.   Nutritional Goals: (per RD assessment 3/12) 80-100 grams of protein per day, 1600 - 1750 kCal/day  Goal TNA Formula and Regimen: Clinimix-E 5/15 at 70 mL/hr + Fat Emulsion 20% at 10 mL/hr to deliver 84 grams  protein/day, 1672 kCal/day  3/13: TNA Access:  PICC On TNA Day #1 (3/12 - present)  Labs: Glucose/CBGs: < 150, requiring minimal SSI so far.  No hx of DM. Electrolytes: K and Mg at or near LLN, anticipated to fall further with refeeding.  Serum bicarb slightly elevated but stable.  Other electrolytes WNL. Renal:   SCr below LLN. BUN WNL. Hepatic:  LFTs below ULN Triglycerides: pending today Prealbumin:  6.7 at baseline on 3/11  Plan:  1. KCl 10 mEq IV q1h x 2 doses 2. Magnesium Sulfate 1 gram IV x 1 dose 3. At 1800 tonight:      -Increase Clinimix-E 5/15 to 50 mL/hr.      -Continue Fat Emulsion 20% at 10 mL/hr.      -Continue Multivitamins in TNA daily.        -Continue Trace Elements MWF only due to national shortage.      -Reduce mIVF to 20 mL/hr. 4.  Continue CBGs q6h, cover with sensitive-scale SSI. 5. BMet, Mag, Phos tomorrow. 6. Follow-up on today's pending triglyceride and prealbumin values. 7. Ultimately increase Clinimix as tolerated to goal rate of 70 mL/hr 8. Full TNA labs every Monday and Thursday.   Clayburn Pert, PharmD, BCPS Pager: [phone] 11/08/2013  6:10 AM

## 2013-11-08 NOTE — Progress Notes (Signed)
Patient seen in treatment area linac 3 by MD

## 2013-11-09 LAB — GLUCOSE, CAPILLARY
Glucose-Capillary: 127 mg/dL — ABNORMAL HIGH (ref 70–99)
Glucose-Capillary: 137 mg/dL — ABNORMAL HIGH (ref 70–99)
Glucose-Capillary: 147 mg/dL — ABNORMAL HIGH (ref 70–99)
Glucose-Capillary: 149 mg/dL — ABNORMAL HIGH (ref 70–99)

## 2013-11-09 LAB — BASIC METABOLIC PANEL
BUN: 8 mg/dL (ref 6–23)
CO2: 30 mEq/L (ref 19–32)
Calcium: 8.3 mg/dL — ABNORMAL LOW (ref 8.4–10.5)
Chloride: 99 mEq/L (ref 96–112)
Creatinine, Ser: 0.4 mg/dL — ABNORMAL LOW (ref 0.50–1.10)
GFR calc Af Amer: >90 mL/min (ref >90)
GFR calc non Af Amer: >90 mL/min (ref >90)
Glucose, Bld: 145 mg/dL — ABNORMAL HIGH (ref 70–99)
Potassium: 4 mEq/L (ref 3.7–5.3)
Sodium: 137 mEq/L (ref 137–147)

## 2013-11-09 LAB — CBC
HCT: 26.7 % — ABNORMAL LOW (ref 36.0–46.0)
Hemoglobin: 8.7 g/dL — ABNORMAL LOW (ref 12.0–15.0)
MCH: 31 pg (ref 26.0–34.0)
MCHC: 32.6 g/dL (ref 30.0–36.0)
MCV: 95 fL (ref 78.0–100.0)
Platelets: 382 10*3/uL (ref 150–400)
RBC: 2.81 MIL/uL — ABNORMAL LOW (ref 3.87–5.11)
RDW: 16.2 % — ABNORMAL HIGH (ref 11.5–15.5)
WBC: 20.3 10*3/uL — ABNORMAL HIGH (ref 4.0–10.5)

## 2013-11-09 LAB — CLOSTRIDIUM DIFFICILE BY PCR: Toxigenic C. Difficile by PCR: NEGATIVE

## 2013-11-09 LAB — PHOSPHORUS: Phosphorus: 3.2 mg/dL (ref 2.3–4.6)

## 2013-11-09 LAB — MAGNESIUM: Magnesium: 1.8 mg/dL (ref 1.5–2.5)

## 2013-11-09 MED ORDER — KCL-LACTATED RINGERS-D5W 20 MEQ/L IV SOLN
INTRAVENOUS | Status: DC
  Administered 2013-11-11 – 2013-11-13 (×2): via INTRAVENOUS
  Filled 2013-11-09 (×5): qty 1000

## 2013-11-09 MED ORDER — M.V.I. ADULT IV INJ
INTRAVENOUS | Status: AC
  Administered 2013-11-09: 17:00:00 via INTRAVENOUS
  Filled 2013-11-09: qty 2000

## 2013-11-09 MED ORDER — FAT EMULSION 20 % IV EMUL
250.0000 mL | INTRAVENOUS | Status: AC
  Administered 2013-11-09: 250 mL via INTRAVENOUS
  Filled 2013-11-09: qty 250

## 2013-11-09 NOTE — Progress Notes (Signed)
6 Days Post-Op  Subjective: She says she feels better.  Ambulating in hall.   High NG outputs.  Tolerating TPN.     Objective: Vital signs in last 24 hours: Temp:  [98 F (36.7 C)-98.2 F (36.8 C)] 98.2 F (36.8 C) (03/14 0542) Pulse Rate:  [83-97] 87 (03/14 0542) Resp:  [13-22] 22 (03/14 0811) BP: (96-111)/(56-72) 96/56 mmHg (03/14 0542) SpO2:  [94 %-98 %] 98 % (03/14 0811) FiO2 (%):  [37 %] 37 % (03/14 0311) Last BM Date: 11/08/13 PO - none 1500 from the NG yesterday. TNA started 3/12 Drain 50ml Stool scant fluid in bag Afebrile, VSS WBC decreasing Intake/Output from previous day: 03/13 0701 - 03/14 0700 In: 2080.7 [I.V.:244; NG/GT:120; IV Piggyback:350; TPN:1366.7] Out: 3290 [Urine:1775; Emesis/NG output:1500; Drains:15] Intake/Output this shift: Total I/O In: -  Out: 350 [Urine:350]  General appearance: alert, cooperative, no distress  Resp: BS are diminished  GI: Open wound is OK, the Ostomy remains dark and necrotic looking but intact.  BS are hypoactive.   Lab Results:   Recent Labs  11/08/13 1743 11/09/13 0422  WBC 22.3* 20.3*  HGB 8.9* 8.7*  HCT 26.5* 26.7*  PLT 384 382    BMET  Recent Labs  11/08/13 0417 11/09/13 0422  NA 139 137  K 3.7 4.0  CL 96 99  CO2 33* 30  GLUCOSE 130* 145*  BUN 6 8  CREATININE 0.39* 0.40*  CALCIUM 8.3* 8.3*   PT/INR No results found for this basename: LABPROT, INR,  in the last 72 hours   Recent Labs Lab 11/03/13 0304 11/08/13 0417  AST 17 12  ALT 20 14  ALKPHOS 104 83  BILITOT 0.8 0.5  PROT 7.1 5.2*  ALBUMIN 2.4* 1.6*     Lipase     Component Value Date/Time   LIPASE 8* 11/03/2013 0304     Studies/Results: Ct Angio Chest Pe W/cm &/or Wo Cm  11/08/2013   CLINICAL DATA:  Known right lung malignancy, elevated white blood cell count, clinical suspicion of pulmonary embolism.  EXAM: CT ANGIOGRAPHY CHEST WITH CONTRAST  TECHNIQUE: Multidetector CT imaging of the chest was performed using the standard  protocol during bolus administration of intravenous contrast. Multiplanar CT image reconstructions and MIPs were obtained to evaluate the vascular anatomy.  CONTRAST:  166mL OMNIPAQUE IOHEXOL 350 MG/ML SOLN  COMPARISON:  DG CHEST 2 VIEW dated 11/06/2013; CT CHEST W/ CM dated 08/19/2013  FINDINGS: Contrast within the pulmonary arterial tree is normal in appearance. There are no filling defects to suggest an acute pulmonary embolism. The caliber of the thoracic aorta is normal. There is no evidence of a false lumen. The cardiac chambers are mildly enlarged. No bulky mediastinal lymph nodes are demonstrated. There is right hilar lymph node on image 42 which measures 1.3 cm in short axis which has enlarged from 0.9 cm on the previous study. A precarinal lymph node measures 7 mm in short axis. There is no subcarinal lymphadenopathy. There is a small right pleural effusion. There is no left pleural effusion or pericardial effusion.  At lung window settings again demonstrated are bilateral bullous emphysematous changes. A right upper lobe mass is again demonstrated which measures 3.5 cm AP x 3.6 cm transversely has not significantly changed in size since the previous study. There is considerable increased interstitial density posteriorly and inferiorly to this mass extending to the right hilum. This has progressed since the previous study. In the right lower lobe there is a new area of atelectasis with  the aforementioned small pleural effusion. The left lung exhibits stable emphysematous changes. No interstitial or alveolar infiltrate is demonstrated. Minimal subsegmental atelectasis at the left lung base is present.  Within the upper abdomen the liver exhibits decreased density with consistent with fatty infiltration. No focal mass is demonstrated within the visualized portions of the liver. The adrenal glands are not included in the field of view. The esophagogastric tube tip is not included in the field of view.  Review  of the MIP images confirms the above findings.  IMPRESSION: 1. There is no evidence of an acute pulmonary embolism or acute thoracic aortic pathology. 2. The known right upper lobe mass has not significantly changed in size. There is increased subsegmental atelectasis or lymphedema in the lung parenchyma inferior to the lung mass as compared to the previous study. A right hilar lymph node has become more conspicuous since the previous study but is maximal short axis dimension is 1.3 cm. 3. There is a new small right pleural effusion and new right lower lobe consolidation posteriorly worrisome for pneumonia. There is minimal subsegmental atelectasis at the left lung base.   Electronically Signed   By: David  [place]ique   On: 11/08/2013 11:01   Ct Abdomen Pelvis W Contrast  11/08/2013   CLINICAL DATA:  Elevated white blood cell count  EXAM: CT ABDOMEN AND PELVIS WITH CONTRAST  TECHNIQUE: Multidetector CT imaging of the abdomen and pelvis was performed using the standard protocol following bolus administration of intravenous contrast.  CONTRAST:  177mL OMNIPAQUE IOHEXOL 350 MG/ML SOLN intravenously. The patient also received oral contrast material.  COMPARISON:  CT ABD/PELVIS W CM dated 11/03/2013  FINDINGS: The patient has undergone sigmoid resection with creation of a left-sided colostomy. There is a percutaneously placed drainage catheter in place within the pelvis. There is a tiny amount of free air visible on images 63-66 adjacent to the open midline incision. The partially contrast filled loops of small bowel exhibit no evidence of obstruction and only a mild ileus type pattern. There is mild thickening of small bowel loops in the right lower quadrant of the abdomen. Small interloop abscesses are demonstrated: On image 66 there is a 2.5 cm dimension fluid collection. On image 70 there is a 1.9 cm diameter fluid collection. On image 45 and there is a 3.7 cm diameter fluid collection. There is free fluid and a small  amount of gas in the left paracolic gutter demonstrated best on images 42-52.  The colon is partially distended with contrast and gas. The remnant rectosigmoid colon is partially distended with fluid. There is gas in the perirectal soft tissues on images 64 through 83. The drainage catheter lies immediately anterior and superior to the gas. The urinary bladder is moderately distended and contains gas as well as fluid. The uterus is surgically absent. No adnexal masses are demonstrated.  The liver exhibits no focal mass nor significant ductal dilation. The gallbladder is mildly distended. A small amount of extraluminal gas lies adjacent to the gallbladder. The stomach is nondistended and a nasogastric tube is present with the tip in the midbody of the stomach. The spleen is not enlarged. The pancreas, adrenal glands, and kidneys are normal in appearance. The caliber of the abdominal aorta is normal.  The lumbar spine and bony pelvis exhibit no acute abnormalities. There is mildly increased density in the subcutaneous fat diffusely likely secondary to the patient's volume and protein status. There is a small right pleural effusion and trace left pleural effusion.  There is right lower lobe atelectasis or pneumonia.  IMPRESSION: 1. Since the previous study the patient has undergone sigmoid colectomy and creation of left-sided colostomy for perforated diverticulitis. A drainage catheter has been placed in the pelvis. Previously described interloop abscesses are again demonstrated but appear to have decreased in size. There remains free fluid and free air within the peritoneal cavity. 2. There is no evidence of a small or large bowel obstruction. Mild thickening of small bowel loops in the right lower quadrant of the abdomen is demonstrated. There is a moderate amount of stool and gas in the distal most portion of the descending colon just proximal to the colostomy. 3. The kidneys exhibit no evidence of obstruction or  inflammatory changes. There is gas as well as urine within the urinary bladder. This may be due to recent catheterization but the possibility of infection or fistulization is raised. 4. There is no acute hepatobiliary abnormality. 5. There small bilateral pleural effusions and there is right lower lobe atelectasis or pneumonia.   Electronically Signed   By: David  Martinique   On: 11/08/2013 14:02    Medications: . heparin subcutaneous  5,000 Units Subcutaneous 3 times per day  . insulin aspart  0-9 Units Subcutaneous 4 times per day  . morphine   Intravenous 6 times per day  . pantoprazole (PROTONIX) IV  40 mg Intravenous Q24H  . piperacillin-tazobactam (ZOSYN)  IV  3.375 g Intravenous Q8H  . vancomycin  500 mg Intravenous Q12H    Assessment/Plan Perforated sigmoid colon with multiple abscesses  Emergent exploratory laparotomy, drainage of intra-abdominal abscesses, mobilization of splenic flexure, sigmoid colectomy, colostomy---Dr. Zella Richer 11/03/13  Hx metastatic lung cancer with thoracic radiotherapy--Dr. [name]  She has been skipping treatments because she felt so bad, she had treatment 5/25 yesterday afternoon COPD/Tobacco use hx  Hypertension  Anemia, this is new  Deconditioning and Malnutrition (prealbumin 6.7 and started on TNA 11/07/13) Post op ileus Leukocytoisis   Plan: ostomy viable underneath per Dr Rosendo Gros exam yesterday.  WBC trending down today, cont vanc and zosyn. C diff pending.  Appreciate hospitalist assistance.  Cont TPN and NG for now.      LOS: 6 days    Sanel Stemmer C. [id number]

## 2013-11-09 NOTE — Progress Notes (Signed)
[facility] PROGRESS NOTE  ROLANDA CAMPA CXK:[id number] DOB: March 01, 1951 DOA: 11/03/2013  PCP: [name], MD  Reason for Consult: Leukocytosis and Possible Pneumonia  Brief HPI: Alexis [name] is a 63 y.o. 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 She presented to the hospital with abdominal distention and pain and was found to have pneumoperitoneum. She underwent emergent surgery on March 8 and was found to have perforated sigmoid colon with multiple intra-abdominal abscess ease. She underwent drainage of these abscesses, sigmoid colectomy and colostomy. She's currently on TPN. She is getting radiation treatments on a daily basis for her lung cancer and brain mets. It was noted by the surgical staff that her WBC was increasing. She underwent a CT scan of her chest, Abdomen, pelvis and was noted to have small pleural effusion and possibility of an infiltrate in the right lower lung. The abscesses in the abdomen appeared to be improving.   Procedures: Exploratory lap, sigmoid colectomy, abscess drainage 3/8  Antibiotics: Zosyn 3/8--> Vanc 3/13-->  Subjective: Patient feels better. Denies any cough or shortness of breath. Ambulated. Not much output from ostomy per patient.  Objective: Vital Signs  Filed Vitals:   11/08/13 2321 11/09/13 0311 11/09/13 0542 11/09/13 0811  BP:   96/56   Pulse:   87   Temp:   98.2 F (36.8 C)   TempSrc:   Oral   Resp: 19 17 18 22   Height:      Weight:      SpO2: 95% 94% 98% 98%    Intake/Output Summary (Last 24 hours) at 11/09/13 0909 Last data filed at 11/09/13 0740  Gross per 24 hour  Intake 2080.66 ml  Output   3640 ml  Net -1559.34 ml   Filed Weights   11/03/13 0021 11/04/13 0400 11/05/13 0400  Weight: 52.164 kg (115 lb) 54.3 kg (119 lb 11.4 oz) 55.2 kg (121 lb 11.1 oz)   General appearance: alert, cooperative, appears stated age and no  distress Resp: decrease air entry at bases without wheezing or crackles Cardio: regular rate and rhythm, S1, S2 normal, no murmur, click, rub or gallop GI: tender from recent surgery, dressing noted, BS present. Ostomy with brown stool. No blood seen. Extremities: extremities normal, atraumatic, no cyanosis or edema Neurologic: no focal deficits  Lab Results:  Basic Metabolic Panel:  Recent Labs Lab 11/03/13 0304 11/04/13 0333 11/07/13 0455 11/08/13 0417 11/09/13 0422  NA 132* 139 139 139 137  K 3.5* 4.3 3.9 3.7 4.0  CL 91* 102 98 96 99  CO2 24 24 34* 33* 30  GLUCOSE 120* 240* 97 130* 145*  BUN 20 22 6 6 8   CREATININE 0.58 0.53 0.45* 0.39* 0.40*  CALCIUM 9.4 8.6 8.2* 8.3* 8.3*  MG  --   --   --  1.5 1.8  PHOS  --   --   --  4.1 3.2   Liver Function Tests:  Recent Labs Lab 11/03/13 0304 11/08/13 0417  AST 17 12  ALT 20 14  ALKPHOS 104 83  BILITOT 0.8 0.5  PROT 7.1 5.2*  ALBUMIN 2.4* 1.6*    Recent Labs Lab 11/03/13 0304  LIPASE 8*   No results found for this basename: AMMONIA,  in the last 168 hours CBC:  Recent Labs Lab 11/03/13 0304 11/04/13 0333 11/07/13 0455 11/08/13 0417 11/08/13 1743 11/09/13 0422  WBC 14.2* 8.5 15.9* 19.2* 22.3* 20.3*  NEUTROABS 11.5*  --   --  16.8*  --   --   HGB 12.8 10.2* 8.3* 9.0* 8.9* 8.7*  HCT 37.3 30.3* 25.7* 28.0* 26.5* 26.7*  MCV 92.8 93.5 94.8 94.9 92.7 95.0  PLT 324 292 296 393 384 382   CBG:  Recent Labs Lab 11/08/13 0614 11/08/13 1221 11/08/13 1749 11/09/13 0010 11/09/13 0611  GLUCAP 134* 129* 127* 149* 127*    Studies/Results: Ct Angio Chest Pe W/cm &/or Wo Cm  11/08/2013   CLINICAL DATA:  Known right lung malignancy, elevated white blood cell count, clinical suspicion of pulmonary embolism.  EXAM: CT ANGIOGRAPHY CHEST WITH CONTRAST  TECHNIQUE: Multidetector CT imaging of the chest was performed using the standard protocol during bolus administration of intravenous contrast. Multiplanar CT image  reconstructions and MIPs were obtained to evaluate the vascular anatomy.  CONTRAST:  136mL OMNIPAQUE IOHEXOL 350 MG/ML SOLN  COMPARISON:  DG CHEST 2 VIEW dated 11/06/2013; CT CHEST W/ CM dated 08/19/2013  FINDINGS: Contrast within the pulmonary arterial tree is normal in appearance. There are no filling defects to suggest an acute pulmonary embolism. The caliber of the thoracic aorta is normal. There is no evidence of a false lumen. The cardiac chambers are mildly enlarged. No bulky mediastinal lymph nodes are demonstrated. There is right hilar lymph node on image 42 which measures 1.3 cm in short axis which has enlarged from 0.9 cm on the previous study. A precarinal lymph node measures 7 mm in short axis. There is no subcarinal lymphadenopathy. There is a small right pleural effusion. There is no left pleural effusion or pericardial effusion.  At lung window settings again demonstrated are bilateral bullous emphysematous changes. A right upper lobe mass is again demonstrated which measures 3.5 cm AP x 3.6 cm transversely has not significantly changed in size since the previous study. There is considerable increased interstitial density posteriorly and inferiorly to this mass extending to the right hilum. This has progressed since the previous study. In the right lower lobe there is a new area of atelectasis with the aforementioned small pleural effusion. The left lung exhibits stable emphysematous changes. No interstitial or alveolar infiltrate is demonstrated. Minimal subsegmental atelectasis at the left lung base is present.  Within the upper abdomen the liver exhibits decreased density with consistent with fatty infiltration. No focal mass is demonstrated within the visualized portions of the liver. The adrenal glands are not included in the field of view. The esophagogastric tube tip is not included in the field of view.  Review of the MIP images confirms the above findings.  IMPRESSION: 1. There is no evidence  of an acute pulmonary embolism or acute thoracic aortic pathology. 2. The known right upper lobe mass has not significantly changed in size. There is increased subsegmental atelectasis or lymphedema in the lung parenchyma inferior to the lung mass as compared to the previous study. A right hilar lymph node has become more conspicuous since the previous study but is maximal short axis dimension is 1.3 cm. 3. There is a new small right pleural effusion and new right lower lobe consolidation posteriorly worrisome for pneumonia. There is minimal subsegmental atelectasis at the left lung base.   Electronically Signed   By: David  Martinique   On: 11/08/2013 11:01   Ct Abdomen Pelvis W Contrast  11/08/2013   CLINICAL DATA:  Elevated white blood cell count  EXAM: CT ABDOMEN AND PELVIS WITH CONTRAST  TECHNIQUE: Multidetector CT imaging of the abdomen and pelvis was performed using the standard protocol following bolus administration  of intravenous contrast.  CONTRAST:  173mL OMNIPAQUE IOHEXOL 350 MG/ML SOLN intravenously. The patient also received oral contrast material.  COMPARISON:  CT ABD/PELVIS W CM dated 11/03/2013  FINDINGS: The patient has undergone sigmoid resection with creation of a left-sided colostomy. There is a percutaneously placed drainage catheter in place within the pelvis. There is a tiny amount of free air visible on images 63-66 adjacent to the open midline incision. The partially contrast filled loops of small bowel exhibit no evidence of obstruction and only a mild ileus type pattern. There is mild thickening of small bowel loops in the right lower quadrant of the abdomen. Small interloop abscesses are demonstrated: On image 66 there is a 2.5 cm dimension fluid collection. On image 70 there is a 1.9 cm diameter fluid collection. On image 50 and there is a 3.7 cm diameter fluid collection. There is free fluid and a small amount of gas in the left paracolic gutter demonstrated best on images 42-52.  The  colon is partially distended with contrast and gas. The remnant rectosigmoid colon is partially distended with fluid. There is gas in the perirectal soft tissues on images 64 through 83. The drainage catheter lies immediately anterior and superior to the gas. The urinary bladder is moderately distended and contains gas as well as fluid. The uterus is surgically absent. No adnexal masses are demonstrated.  The liver exhibits no focal mass nor significant ductal dilation. The gallbladder is mildly distended. A small amount of extraluminal gas lies adjacent to the gallbladder. The stomach is nondistended and a nasogastric tube is present with the tip in the midbody of the stomach. The spleen is not enlarged. The pancreas, adrenal glands, and kidneys are normal in appearance. The caliber of the abdominal aorta is normal.  The lumbar spine and bony pelvis exhibit no acute abnormalities. There is mildly increased density in the subcutaneous fat diffusely likely secondary to the patient's volume and protein status. There is a small right pleural effusion and trace left pleural effusion. There is right lower lobe atelectasis or pneumonia.  IMPRESSION: 1. Since the previous study the patient has undergone sigmoid colectomy and creation of left-sided colostomy for perforated diverticulitis. A drainage catheter has been placed in the pelvis. Previously described interloop abscesses are again demonstrated but appear to have decreased in size. There remains free fluid and free air within the peritoneal cavity. 2. There is no evidence of a small or large bowel obstruction. Mild thickening of small bowel loops in the right lower quadrant of the abdomen is demonstrated. There is a moderate amount of stool and gas in the distal most portion of the descending colon just proximal to the colostomy. 3. The kidneys exhibit no evidence of obstruction or inflammatory changes. There is gas as well as urine within the urinary bladder. This  may be due to recent catheterization but the possibility of infection or fistulization is raised. 4. There is no acute hepatobiliary abnormality. 5. There small bilateral pleural effusions and there is right lower lobe atelectasis or pneumonia.   Electronically Signed   By: David  Martinique   On: 11/08/2013 14:02    Medications:  Scheduled: . heparin subcutaneous  5,000 Units Subcutaneous 3 times per day  . insulin aspart  0-9 Units Subcutaneous 4 times per day  . morphine   Intravenous 6 times per day  . pantoprazole (PROTONIX) IV  40 mg Intravenous Q24H  . piperacillin-tazobactam (ZOSYN)  IV  3.375 g Intravenous Q8H  . vancomycin  500 mg Intravenous Q12H   Continuous: . dextrose 5% lactated ringers with KCl 20 mEq/L    . dextrose 5% lactated ringers with KCl 20 mEq/L    . Marland KitchenTPN (CLINIMIX-E) Adult 50 mL/hr at 11/08/13 1746   And  . fat emulsion 250 mL (11/08/13 1745)  . Marland KitchenTPN (CLINIMIX-E) Adult     And  . fat emulsion     ZOX:[id number], diphenhydrAMINE, naloxone, ondansetron (ZOFRAN) IV, ondansetron (ZOFRAN) IV, ondansetron, phenol, sodium chloride, sodium chloride  Assessment/Plan:  Active Problems:   Pneumoperitoneum   Perforation of sigmoid colon   Leukocytosis, unspecified   HCAP (healthcare-associated pneumonia)    Leukocytosis No change. No clear etiology for the same. Recent CT scan suggested possibility of pneumonia. She is however, asymptomatic. She doesn't have any obvious rash. The urinalysis was unremarkable. Other possibilities include C. difficile. The intra-abdominal abscesses appeared to have decreased in size. C. Difficile PCR is pending. Treat possible pneumonia as below.   Possible Healthcare Associated Pneumonia She was already on Zosyn. Vancomycin was added 3/13. Blood cultures obtained. Incentive spirometry. Oxygen as needed.   Metastatic Lung Cancer Management as per oncology and radiation oncologist. She is getting radiation  treatments.  Perforation of sigmoid colon status post sigmoid colectomy with several intra-abdominal abscesses Management per surgery.   Anemia Likely from recent acute illness. Per RN some blood noted through colostomy but none seen today. Hgb appears to stable. Continue to monitor.  TRH will follow daily. Please call if there are any questions.   LOS: 6 days   [facility] Pager [phone] 11/09/2013, 9:09 AM  If 8PM-8AM, please contact night-coverage at [email or web address], password [facility]

## 2013-11-09 NOTE — Progress Notes (Signed)
[place] NOTE   Pharmacy Consult for TNA Indication: prolonged post-operative ileus  Allergies  Allergen Reactions  . Codeine Nausea And Vomiting    Patient Measurements: Height: 5\' 4"  (162.6 cm) Weight: 121 lb 11.1 oz (55.2 kg) IBW/kg (Calculated) : 54.7  Vital Signs: Temp: 98.2 F (36.8 C) (03/14 0542) Temp src: Oral (03/14 0542) BP: 96/56 mmHg (03/14 0542) Pulse Rate: 87 (03/14 0542) Intake/Output from previous day: 03/13 0701 - 03/14 0700 In: 2080.7 [I.V.:244; NG/GT:120; IV Piggyback:350; TPN:1366.7] Out: 3290 [Urine:1775; Emesis/NG output:1500; Drains:15] Intake/Output from this shift: Total I/O In: -  Out: 350 [Urine:350]  Labs:  Recent Labs  11/08/13 0417 11/08/13 1743 11/09/13 0422  WBC 19.2* 22.3* 20.3*  HGB 9.0* 8.9* 8.7*  HCT 28.0* 26.5* 26.7*  PLT 393 384 382     Recent Labs  11/06/13 1130 11/07/13 0455 11/08/13 0417 11/09/13 0422  NA  --  139 139 137  K  --  3.9 3.7 4.0  CL  --  98 96 99  CO2  --  34* 33* 30  GLUCOSE  --  97 130* 145*  BUN  --  6 6 8   CREATININE  --  0.45* 0.39* 0.40*  CALCIUM  --  8.2* 8.3* 8.3*  MG  --   --  1.5 1.8  PHOS  --   --  4.1 3.2  PROT  --   --  5.2*  --   ALBUMIN  --   --  1.6*  --   AST  --   --  12  --   ALT  --   --  14  --   ALKPHOS  --   --  83  --   BILITOT  --   --  0.5  --   PREALBUMIN 6.7*  --  7.2*  --   TRIG  --   --  102  --   Corrected calcium 3/13: 10.2 Estimated Creatinine Clearance: 63 ml/min (by C-G formula based on Cr of 0.4).    Recent Labs  11/08/13 1749 11/09/13 0010 11/09/13 0611  GLUCAP 127* 149* 127*    Medical History: Past Medical History  Diagnosis Date  . Cancer     lung, hx cervicle    Medications:  Scheduled:  . heparin subcutaneous  5,000 Units Subcutaneous 3 times per day  . insulin aspart  0-9 Units Subcutaneous 4 times per day  . morphine   Intravenous 6 times per day  . pantoprazole (PROTONIX) IV  40 mg Intravenous Q24H  .  piperacillin-tazobactam (ZOSYN)  IV  3.375 g Intravenous Q8H  . vancomycin  500 mg Intravenous Q12H   Infusions:  . dextrose 5% lactated ringers with KCl 20 mEq/L    . Marland KitchenTPN (CLINIMIX-E) Adult 50 mL/hr at 11/08/13 1746   And  . 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  On 3/8 underwent emergency exploratory laparotomy, drainage of intra-abdominal abscesses, mobilization of splenic flexure, sigmoid colectomy, colostomy.   Bowel function has not yet returned and prealbumin  is 6.7.  TNA initiated 3/12 with pharmacy assistance requested.  Nutritional Goals: (per RD assessment 3/12) 80-100 grams of protein per day, 1600 - 1750 kCal/day  Goal TNA Formula and Regimen: Clinimix-E 5/15 at 70 mL/hr + Fat Emulsion 20% at 10 mL/hr to deliver 84 grams protein/day, 1672 kCal/day  3/14: TNA Access:  PICC On TNA Day #3 (3/12 - present)  Labs: Glucose/CBGs: < 150, controlled with minimal SSI so far.  No hx of DM. Electrolytes: K and Mg improved with replacements yesterday.  Electrolytes WNL.  Will continue to monitor closely for signs of refeeding as we advance TNA rate today.  Phos trended down today but remains within normal limits. Renal:   SCr below LLN. BUN WNL. Hepatic:  LFTs WNL Triglycerides: 102 (3/13) Prealbumin:  6.7 (3/11), 7.2 (3/13)  Plan:  1. At 1800 tonight:      -Increase Clinimix-E 5/15 to 70 mL/hr (goal rate)      -Continue Fat Emulsion 20% at 10 mL/hr.      -Continue Multivitamins in TNA daily.         -Continue Trace Elements MWF only due to national shortage. 2. Reduce mIVF to 20 mL/hr this AM.  Rate was not reduced from 30 ml/hr yesterday as ordered.  Then reduce rate to [facility] once TNA rate is increased at 1800. 3. Continue CBGs q6h, cover with sensitive-scale SSI. 4. BMet, Mag, Phos tomorrow. 5. Full TNA labs every Monday and Thursday.   Hershal Coria, PharmD, BCPS Pager: [phone] 11/09/2013 8:35 AM

## 2013-11-09 NOTE — ED Provider Notes (Signed)
Medical screening examination/treatment/procedure(s) were performed by non-physician practitioner and as supervising physician I was immediately available for consultation/collaboration.   EKG Interpretation None       Teressa Lower, MD 11/09/13 1121

## 2013-11-10 LAB — CBC
HCT: 25.5 % — ABNORMAL LOW (ref 36.0–46.0)
Hemoglobin: 8.4 g/dL — ABNORMAL LOW (ref 12.0–15.0)
MCH: 31.1 pg (ref 26.0–34.0)
MCHC: 32.9 g/dL (ref 30.0–36.0)
MCV: 94.4 fL (ref 78.0–100.0)
Platelets: 431 10*3/uL — ABNORMAL HIGH (ref 150–400)
RBC: 2.7 MIL/uL — ABNORMAL LOW (ref 3.87–5.11)
RDW: 16.4 % — ABNORMAL HIGH (ref 11.5–15.5)
WBC: 17.8 10*3/uL — ABNORMAL HIGH (ref 4.0–10.5)

## 2013-11-10 LAB — URINE CULTURE
Colony Count: NO GROWTH
Culture: NO GROWTH

## 2013-11-10 LAB — GLUCOSE, CAPILLARY
Glucose-Capillary: 134 mg/dL — ABNORMAL HIGH (ref 70–99)
Glucose-Capillary: 149 mg/dL — ABNORMAL HIGH (ref 70–99)
Glucose-Capillary: 161 mg/dL — ABNORMAL HIGH (ref 70–99)

## 2013-11-10 LAB — BASIC METABOLIC PANEL
BUN: 11 mg/dL (ref 6–23)
CO2: 30 mEq/L (ref 19–32)
Calcium: 8.4 mg/dL (ref 8.4–10.5)
Chloride: 98 mEq/L (ref 96–112)
Creatinine, Ser: 0.38 mg/dL — ABNORMAL LOW (ref 0.50–1.10)
GFR calc Af Amer: >90 mL/min (ref >90)
GFR calc non Af Amer: >90 mL/min (ref >90)
Glucose, Bld: 150 mg/dL — ABNORMAL HIGH (ref 70–99)
Potassium: 4.1 mEq/L (ref 3.7–5.3)
Sodium: 136 mEq/L — ABNORMAL LOW (ref 137–147)

## 2013-11-10 LAB — PHOSPHORUS: Phosphorus: 3.6 mg/dL (ref 2.3–4.6)

## 2013-11-10 LAB — VANCOMYCIN, TROUGH: Vancomycin Tr: <5 ug/mL — ABNORMAL LOW (ref 10.0–20.0)

## 2013-11-10 LAB — MAGNESIUM: Magnesium: 1.9 mg/dL (ref 1.5–2.5)

## 2013-11-10 MED ORDER — FAT EMULSION 20 % IV EMUL
250.0000 mL | INTRAVENOUS | Status: AC
  Administered 2013-11-10: 250 mL via INTRAVENOUS
  Filled 2013-11-10: qty 250

## 2013-11-10 MED ORDER — M.V.I. ADULT IV INJ
INTRAVENOUS | Status: AC
  Administered 2013-11-10: 17:00:00 via INTRAVENOUS
  Filled 2013-11-10: qty 2000

## 2013-11-10 MED ORDER — VANCOMYCIN HCL IN DEXTROSE 1-5 GM/200ML-% IV SOLN
1000.0000 mg | Freq: Two times a day (BID) | INTRAVENOUS | Status: DC
  Administered 2013-11-11 – 2013-11-12 (×4): 1000 mg via INTRAVENOUS
  Filled 2013-11-10 (×5): qty 200

## 2013-11-10 NOTE — Progress Notes (Signed)
[place] NOTE   Pharmacy Consult for TNA Indication: prolonged post-operative ileus  Allergies  Allergen Reactions  . Codeine Nausea And Vomiting    Patient Measurements: Height: 5\' 4"  (162.6 cm) Weight: 121 lb 11.1 oz (55.2 kg) IBW/kg (Calculated) : 54.7  Vital Signs: Temp: 98 F (36.7 C) (03/15 0523) Temp src: Oral (03/15 0523) BP: 101/65 mmHg (03/15 0523) Pulse Rate: 77 (03/15 0523) Intake/Output from previous day: 03/14 0701 - 03/15 0700 In: 1660 [I.V.:670; IV Piggyback:350; TPN:640] Out: 0938 [Urine:2800; Emesis/NG output:1280; Drains:12] Intake/Output from this shift:    Labs:  Recent Labs  11/08/13 1743 11/09/13 0422 11/10/13 0546  WBC 22.3* 20.3* 17.8*  HGB 8.9* 8.7* 8.4*  HCT 26.5* 26.7* 25.5*  PLT 384 382 431*     Recent Labs  11/08/13 0417 11/09/13 0422 11/10/13 0546  NA 139 137 136*  K 3.7 4.0 4.1  CL 96 99 98  CO2 33* 30 30  GLUCOSE 130* 145* 150*  BUN 6 8 11   CREATININE 0.39* 0.40* 0.38*  CALCIUM 8.3* 8.3* 8.4  MG 1.5 1.8 1.9  PHOS 4.1 3.2 3.6  PROT 5.2*  --   --   ALBUMIN 1.6*  --   --   AST 12  --   --   ALT 14  --   --   ALKPHOS 83  --   --   BILITOT 0.5  --   --   PREALBUMIN 7.2*  --   --   TRIG 102  --   --    Estimated Creatinine Clearance: 63 ml/min (by C-G formula based on Cr of 0.38).    Recent Labs  11/09/13 1743 11/09/13 2353 11/10/13 0518  GLUCAP 147* 134* 149*    Medical History: Past Medical History  Diagnosis Date  . Cancer     lung, hx cervicle    Medications:  Scheduled:  . heparin subcutaneous  5,000 Units Subcutaneous 3 times per day  . insulin aspart  0-9 Units Subcutaneous 4 times per day  . morphine   Intravenous 6 times per day  . pantoprazole (PROTONIX) IV  40 mg Intravenous Q24H  . piperacillin-tazobactam (ZOSYN)  IV  3.375 g Intravenous Q8H  . vancomycin  500 mg Intravenous Q12H   Infusions:  . dextrose 5% lactated ringers with KCl 20 mEq/L 20 mL/hr (11/09/13 1800)   . Marland KitchenTPN (CLINIMIX-E) Adult 70 mL/hr at 11/09/13 1718   And  . 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  On 3/8 underwent emergency exploratory laparotomy, drainage of intra-abdominal abscesses, mobilization of splenic flexure, sigmoid colectomy, colostomy.   Bowel function has not yet returned and prealbumin is 6.7.  TNA initiated 3/12 with pharmacy assistance requested.  Nutritional Goals: (per RD assessment 3/12) 80-100 grams of protein per day, 1600 - 1750 kCal/day  Goal TNA Formula and Regimen: Clinimix-E 5/15 at 70 mL/hr + Fat Emulsion 20% at 10 mL/hr to deliver 84 grams protein/day, 1672 kCal/day  3/15: TNA Access:  PICC On TNA Day #4 (3/12 - present)  Patient  tolerating goal rate of TNA.  Continued NG output (1200 mL output recorded yesterday, 580 mL recorded today already).  Labs: Glucose/CBGs: < 150, controlled with minimal SSI so far.  No hx of DM. Electrolytes: Na 136, other lytes WNL Renal:   SCr below LLN. BUN WNL. Hepatic:  LFTs WNL Triglycerides: 102 (3/13) Prealbumin:  6.7 (3/11), 7.2 (3/13)  Plan:  1. At 1800 tonight:      -Continue Clinimix-E 5/15 at goal rate of 70 mL/hr      -Continue Fat Emulsion 20% at 10 mL/hr.      -Continue Multivitamins in TNA daily.        -Continue Trace Elements MWF only due to national shortage. 2. Continue mIVF at [facility]. 3. Continue CBGs q6h, cover with sensitive-scale SSI. 4. Full TNA labs every Monday and Thursday. 5. F/u daily.   Hershal Coria,  PharmD, BCPS Pager: [phone] 11/10/2013 8:46 AM

## 2013-11-10 NOTE — Progress Notes (Signed)
Pharmacy: Brief Antibiotic note   D#3 Vancomycin 500 mg IV q12h, Vancomycin trough undetectable this evening.  Renal function stable.    Plan 1.) Increase vancomycin 1 gram IV q12h 2.)Monitor renal function, repeat VT at [facility]  Alexis [name], Gaye Alken PharmD Pager #: [phone] 6:21 PM 11/10/2013

## 2013-11-10 NOTE — Progress Notes (Signed)
In to change dressing to midline as ordered. Pt requested to waiting til after lunch. Stated "the ostomy nurse is coming this afternoon to teach me and my husband." Pt currently up in chair. Reassurance given.

## 2013-11-10 NOTE — Progress Notes (Signed)
ANTIBIOTIC CONSULT NOTE - FOLLOW UP  Pharmacy Consult for Vancomcyin Indication: pneumonia  Allergies  Allergen Reactions  . Codeine Nausea And Vomiting    Patient Measurements: Height: 5\' 4"  (162.6 cm) Weight: 121 lb 11.1 oz (55.2 kg) IBW/kg (Calculated) : 54.7   Vital Signs: Temp: 98 F (36.7 C) (03/15 0523) Temp src: Oral (03/15 0523) BP: 101/65 mmHg (03/15 0523) Pulse Rate: 77 (03/15 0523) Intake/Output from previous day: 03/14 0701 - 03/15 0700 In: 1660 [I.V.:670; IV Piggyback:350; TPN:640] Out: 0160 [Urine:2800; Emesis/NG output:1280; Drains:12] Intake/Output from this shift:    Labs:  Recent Labs  11/08/13 0417 11/08/13 1743 11/09/13 0422 11/10/13 0546  WBC 19.2* 22.3* 20.3* 17.8*  HGB 9.0* 8.9* 8.7* 8.4*  PLT 393 384 382 431*  CREATININE 0.39*  --  0.40* 0.38*   Estimated Creatinine Clearance: 63 ml/min (by C-G formula based on Cr of 0.38). No results found for this basename: VANCOTROUGH, Corlis Leak, VANCORANDOM, [place], GENTPEAK, GENTRANDOM, TOBRATROUGH, TOBRAPEAK, TOBRARND, AMIKACINPEAK, AMIKACINTROU, AMIKACIN,  in the last 72 hours   Microbiology: Recent Results (from the past 720 hour(s))  CULTURE, BLOOD (ROUTINE X 2)     Status: None   Collection Time    11/08/13  5:00 PM      Result Value Ref Range Status   Specimen Description BLOOD LEFT HAND   Final   Special Requests BOTTLES DRAWN AEROBIC AND ANAEROBIC 10CC   Final   Culture  Setup Time     Final   Value: 11/08/2013 22:07     Performed at [facility]   Culture     Final   Value:        BLOOD CULTURE RECEIVED NO GROWTH TO DATE CULTURE WILL BE HELD FOR 5 DAYS BEFORE ISSUING A FINAL NEGATIVE REPORT     Performed at [facility]   Report Status PENDING   Incomplete  CULTURE, BLOOD (ROUTINE X 2)     Status: None   Collection Time    11/08/13  5:15 PM      Result Value Ref Range Status   Specimen Description BLOOD LEFT ARM   Final   Special Requests BOTTLES DRAWN AEROBIC  AND ANAEROBIC 10CC   Final   Culture  Setup Time     Final   Value: 11/08/2013 22:07     Performed at [facility]   Culture     Final   Value:        BLOOD CULTURE RECEIVED NO GROWTH TO DATE CULTURE WILL BE HELD FOR 5 DAYS BEFORE ISSUING A FINAL NEGATIVE REPORT     Performed at [facility]   Report Status PENDING   Incomplete  CLOSTRIDIUM DIFFICILE BY PCR     Status: None   Collection Time    11/09/13  2:23 AM      Result Value Ref Range Status   C difficile by pcr NEGATIVE  NEGATIVE Final   Comment: Performed at [facility]   Start     Dose/Rate Route Frequency Ordered Stop   11/09/13 0600  vancomycin (VANCOCIN) IVPB 750 mg/150 ml premix  Status:  Discontinued     750 mg 150 mL/hr over 60 Minutes Intravenous Every 12 hours 11/08/13 1656 11/08/13 1718   11/09/13 0600  vancomycin (VANCOCIN) 500 mg in sodium chloride 0.9 % 100 mL IVPB     500 mg 100 mL/hr over 60 Minutes Intravenous Every 12 hours 11/08/13 1718     11/08/13  1730  vancomycin (VANCOCIN) IVPB 1000 mg/200 mL premix     1,000 mg 200 mL/hr over 60 Minutes Intravenous  Once 11/08/13 1655 11/08/13 1838   11/03/13 1600  piperacillin-tazobactam (ZOSYN) IVPB 3.375 g     3.375 g 12.5 mL/hr over 240 Minutes Intravenous Every 8 hours 11/03/13 1233     11/03/13 0800  piperacillin-tazobactam (ZOSYN) IVPB 3.375 g  Status:  Discontinued    Comments:  First dose stat.   3.375 g 12.5 mL/hr over 240 Minutes Intravenous Every 8 hours 11/03/13 0734 11/03/13 1233      Assessment: 63 y/o F with stage IV NSCLC admitted 3/8 with perforated sigmoid colon s/p emergent ex lap, drainage of intra-abdominal abscess, sigmoid colectomy, colostomy.  TPN started 3/12.  CT angio chest 3/13 showed new RLL consolidation worrisome for PNA.  Patient had been on Zosyn for intra-abdominal issues since 3/8 and MD ordered Vancomycin to be added 3/13 for possible pneumonia, pharmacy to dose.  3/8 >> Zosyn (MD)  >> 3/13 >> Vancomycin >>  Day #3 Vancomycin 1g IV x 1 then 500 mg IV q12h and Day #8 Zosyn 3.375g IV q8h (extended infusion). Afebrile WBCs: 17.3, trending down Renal: SCr 0.38, CrCl ~63 ml/min (CG, using SCr 0.8) Cultures show no growth to date.  Cdiff PCR negative.  Goal of Therapy:  Vancomycin trough level 15-20 mcg/ml Eradication of infection  Plan:  Check vancomycin trough tonight at 1700.    Hershal Coria 11/10/2013,9:42 AM

## 2013-11-10 NOTE — Consult Note (Signed)
WOC ostomy follow up Stoma type/location: LLQ end colostomy; midline surgical incision.  Stomal assessment/size: 1 1/4" flush, necrotic stoma.  No flatus or output noted.  Bloody discharge in pouch.  Peristomal assessment: Intact Treatment options for stomal/peristomal skin: 2 inch barrier ring used to create convexity. Output Bloody Ostomy pouching: 1pc..  Education provided: Spouse at bedside.  POuch change performed.  spouse able to measure and cut pouch.  Open/close velcro closure.  Barrier ring applied.  Minimal barrier trimmed from 10 to 12 o'clock to accomodate surgical incision.  Spouse states he feels more comfortable now that he has participated in care.  NS damp to dampt dressing change performed to surgical incision.   Enrolled patient in [place] Start Discharge program: Yes [place] team will continue to follow.  Domenic Moras RN BSN [place] Pager [phone]

## 2013-11-10 NOTE — Progress Notes (Signed)
[facility] PROGRESS NOTE  Alexis [name] KCL:[id number] DOB: 09/06/1950 DOA: 11/03/2013  PCP: [name], MD  Reason for Consult: Leukocytosis and Possible Pneumonia  Brief HPI: Alexis [name] is a 63 y.o. 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 She presented to the hospital with abdominal distention and pain and was found to have pneumoperitoneum. She underwent emergent surgery on March 8 and was found to have perforated sigmoid colon with multiple intra-abdominal abscess ease. She underwent drainage of these abscesses, sigmoid colectomy and colostomy. She's currently on TPN. She is getting radiation treatments on a daily basis for her lung cancer and brain mets. It was noted by the surgical staff that her WBC was increasing. She underwent a CT scan of her chest, Abdomen, pelvis and was noted to have small pleural effusion and possibility of an infiltrate in the right lower lung. The abscesses in the abdomen appeared to be improving.   Procedures: Exploratory lap, sigmoid colectomy, abscess drainage 3/8  Antibiotics: Zosyn 3/8--> Vanc 3/13-->  Subjective: Patient continues to feels well. Denies any cough or shortness of breath. Ambulated.  Objective: Vital Signs  Filed Vitals:   11/09/13 2150 11/10/13 0027 11/10/13 0523 11/10/13 0720  BP: 101/50  101/65   Pulse: 84  77   Temp: 98.4 F (36.9 C)  98 F (36.7 C)   TempSrc: Oral  Oral   Resp: 16 16 14 21   Height:      Weight:      SpO2: 99% 98% 96% 97%    Intake/Output Summary (Last 24 hours) at 11/10/13 1011 Last data filed at 11/10/13 0949  Gross per 24 hour  Intake   1660 ml  Output   4142 ml  Net  -2482 ml   Filed Weights   11/03/13 0021 11/04/13 0400 11/05/13 0400  Weight: 52.164 kg (115 lb) 54.3 kg (119 lb 11.4 oz) 55.2 kg (121 lb 11.1 oz)   General appearance: alert, cooperative, appears stated age and no  distress Resp: decrease air entry at bases without wheezing or crackles Cardio: regular rate and rhythm, S1, S2 normal, no murmur, click, rub or gallop GI: tender from recent surgery, dressing noted, BS present. Ostomy with brown stool. No blood seen. Extremities: extremities normal, atraumatic, no cyanosis or edema Neurologic: no focal deficits  Lab Results:  Basic Metabolic Panel:  Recent Labs Lab 11/04/13 0333 11/07/13 0455 11/08/13 0417 11/09/13 0422 11/10/13 0546  NA 139 139 139 137 136*  K 4.3 3.9 3.7 4.0 4.1  CL 102 98 96 99 98  CO2 24 34* 33* 30 30  GLUCOSE 240* 97 130* 145* 150*  BUN 22 6 6 8 11   CREATININE 0.53 0.45* 0.39* 0.40* 0.38*  CALCIUM 8.6 8.2* 8.3* 8.3* 8.4  MG  --   --  1.5 1.8 1.9  PHOS  --   --  4.1 3.2 3.6   Liver Function Tests:  Recent Labs Lab 11/08/13 0417  AST 12  ALT 14  ALKPHOS 83  BILITOT 0.5  PROT 5.2*  ALBUMIN 1.6*   CBC:  Recent Labs Lab 11/07/13 0455 11/08/13 0417 11/08/13 1743 11/09/13 0422 11/10/13 0546  WBC 15.9* 19.2* 22.3* 20.3* 17.8*  NEUTROABS  --  16.8*  --   --   --   HGB 8.3* 9.0* 8.9* 8.7* 8.4*  HCT 25.7* 28.0* 26.5* 26.7* 25.5*  MCV 94.8 94.9 92.7 95.0 94.4  PLT 296 393 384  382 431*   CBG:  Recent Labs Lab 11/09/13 0611 11/09/13 1157 11/09/13 1743 11/09/13 2353 11/10/13 0518  GLUCAP 127* 137* 147* 134* 149*    Studies/Results: Ct Angio Chest Pe W/cm &/or Wo Cm  11/08/2013   CLINICAL DATA:  Known right lung malignancy, elevated white blood cell count, clinical suspicion of pulmonary embolism.  EXAM: CT ANGIOGRAPHY CHEST WITH CONTRAST  TECHNIQUE: Multidetector CT imaging of the chest was performed using the standard protocol during bolus administration of intravenous contrast. Multiplanar CT image reconstructions and MIPs were obtained to evaluate the vascular anatomy.  CONTRAST:  164mL OMNIPAQUE IOHEXOL 350 MG/ML SOLN  COMPARISON:  DG CHEST 2 VIEW dated 11/06/2013; CT CHEST W/ CM dated 08/19/2013   FINDINGS: Contrast within the pulmonary arterial tree is normal in appearance. There are no filling defects to suggest an acute pulmonary embolism. The caliber of the thoracic aorta is normal. There is no evidence of a false lumen. The cardiac chambers are mildly enlarged. No bulky mediastinal lymph nodes are demonstrated. There is right hilar lymph node on image 42 which measures 1.3 cm in short axis which has enlarged from 0.9 cm on the previous study. A precarinal lymph node measures 7 mm in short axis. There is no subcarinal lymphadenopathy. There is a small right pleural effusion. There is no left pleural effusion or pericardial effusion.  At lung window settings again demonstrated are bilateral bullous emphysematous changes. A right upper lobe mass is again demonstrated which measures 3.5 cm AP x 3.6 cm transversely has not significantly changed in size since the previous study. There is considerable increased interstitial density posteriorly and inferiorly to this mass extending to the right hilum. This has progressed since the previous study. In the right lower lobe there is a new area of atelectasis with the aforementioned small pleural effusion. The left lung exhibits stable emphysematous changes. No interstitial or alveolar infiltrate is demonstrated. Minimal subsegmental atelectasis at the left lung base is present.  Within the upper abdomen the liver exhibits decreased density with consistent with fatty infiltration. No focal mass is demonstrated within the visualized portions of the liver. The adrenal glands are not included in the field of view. The esophagogastric tube tip is not included in the field of view.  Review of the MIP images confirms the above findings.  IMPRESSION: 1. There is no evidence of an acute pulmonary embolism or acute thoracic aortic pathology. 2. The known right upper lobe mass has not significantly changed in size. There is increased subsegmental atelectasis or lymphedema in  the lung parenchyma inferior to the lung mass as compared to the previous study. A right hilar lymph node has become more conspicuous since the previous study but is maximal short axis dimension is 1.3 cm. 3. There is a new small right pleural effusion and new right lower lobe consolidation posteriorly worrisome for pneumonia. There is minimal subsegmental atelectasis at the left lung base.   Electronically Signed   By: David  Martinique   On: 11/08/2013 11:01   Ct Abdomen Pelvis W Contrast  11/08/2013   CLINICAL DATA:  Elevated white blood cell count  EXAM: CT ABDOMEN AND PELVIS WITH CONTRAST  TECHNIQUE: Multidetector CT imaging of the abdomen and pelvis was performed using the standard protocol following bolus administration of intravenous contrast.  CONTRAST:  159mL OMNIPAQUE IOHEXOL 350 MG/ML SOLN intravenously. The patient also received oral contrast material.  COMPARISON:  CT ABD/PELVIS W CM dated 11/03/2013  FINDINGS: The patient has undergone sigmoid resection  with creation of a left-sided colostomy. There is a percutaneously placed drainage catheter in place within the pelvis. There is a tiny amount of free air visible on images 63-66 adjacent to the open midline incision. The partially contrast filled loops of small bowel exhibit no evidence of obstruction and only a mild ileus type pattern. There is mild thickening of small bowel loops in the right lower quadrant of the abdomen. Small interloop abscesses are demonstrated: On image 66 there is a 2.5 cm dimension fluid collection. On image 70 there is a 1.9 cm diameter fluid collection. On image 34 and there is a 3.7 cm diameter fluid collection. There is free fluid and a small amount of gas in the left paracolic gutter demonstrated best on images 42-52.  The colon is partially distended with contrast and gas. The remnant rectosigmoid colon is partially distended with fluid. There is gas in the perirectal soft tissues on images 64 through 83. The drainage  catheter lies immediately anterior and superior to the gas. The urinary bladder is moderately distended and contains gas as well as fluid. The uterus is surgically absent. No adnexal masses are demonstrated.  The liver exhibits no focal mass nor significant ductal dilation. The gallbladder is mildly distended. A small amount of extraluminal gas lies adjacent to the gallbladder. The stomach is nondistended and a nasogastric tube is present with the tip in the midbody of the stomach. The spleen is not enlarged. The pancreas, adrenal glands, and kidneys are normal in appearance. The caliber of the abdominal aorta is normal.  The lumbar spine and bony pelvis exhibit no acute abnormalities. There is mildly increased density in the subcutaneous fat diffusely likely secondary to the patient's volume and protein status. There is a small right pleural effusion and trace left pleural effusion. There is right lower lobe atelectasis or pneumonia.  IMPRESSION: 1. Since the previous study the patient has undergone sigmoid colectomy and creation of left-sided colostomy for perforated diverticulitis. A drainage catheter has been placed in the pelvis. Previously described interloop abscesses are again demonstrated but appear to have decreased in size. There remains free fluid and free air within the peritoneal cavity. 2. There is no evidence of a small or large bowel obstruction. Mild thickening of small bowel loops in the right lower quadrant of the abdomen is demonstrated. There is a moderate amount of stool and gas in the distal most portion of the descending colon just proximal to the colostomy. 3. The kidneys exhibit no evidence of obstruction or inflammatory changes. There is gas as well as urine within the urinary bladder. This may be due to recent catheterization but the possibility of infection or fistulization is raised. 4. There is no acute hepatobiliary abnormality. 5. There small bilateral pleural effusions and there is  right lower lobe atelectasis or pneumonia.   Electronically Signed   By: David  Martinique   On: 11/08/2013 14:02    Medications:  Scheduled: . heparin subcutaneous  5,000 Units Subcutaneous 3 times per day  . insulin aspart  0-9 Units Subcutaneous 4 times per day  . morphine   Intravenous 6 times per day  . pantoprazole (PROTONIX) IV  40 mg Intravenous Q24H  . piperacillin-tazobactam (ZOSYN)  IV  3.375 g Intravenous Q8H  . vancomycin  500 mg Intravenous Q12H   Continuous: . dextrose 5% lactated ringers with KCl 20 mEq/L 20 mL/hr (11/09/13 1800)  . Marland KitchenTPN (CLINIMIX-E) Adult 70 mL/hr at 11/09/13 1718   And  . fat emulsion  250 mL (11/09/13 1719)  . Marland KitchenTPN (CLINIMIX-E) Adult     And  . fat emulsion     LMR:[id number], diphenhydrAMINE, naloxone, ondansetron (ZOFRAN) IV, ondansetron (ZOFRAN) IV, ondansetron, phenol, sodium chloride, sodium chloride  Assessment/Plan:  Active Problems:   Pneumoperitoneum   Perforation of sigmoid colon   Leukocytosis, unspecified   HCAP (healthcare-associated pneumonia)    Leukocytosis WBC is better today. No clear etiology for the same. Recent CT scan suggested pneumonia. She is however, asymptomatic. She doesn't have any obvious rash. The urinalysis was unremarkable. Other possibilities include C. difficile. The intra-abdominal abscesses appeared to have decreased in size. No cultures available from theses abscesses. C. Difficile PCR is negative. Treat possible pneumonia as below.   Possible Healthcare Associated Pneumonia She was already on Zosyn. Vancomycin was added 3/13. Blood cultures obtained and negative so far. Incentive spirometry. Oxygen as needed. WBC is better. Stable from respiratory standpoint.  Metastatic Lung Cancer Management as per oncology and radiation oncologist. She is getting radiation treatments.  Perforation of sigmoid colon status post sigmoid colectomy with several intra-abdominal abscesses Management per surgery.    Anemia Likely from recent acute illness. Per RN some blood noted through colostomy but none seen today. Hgb appears to stable. Continue to monitor.  TR[facility] will follow daily. Please call if there are any questions.   LOS: 7 days   [facility] Pager [phone] 11/10/2013, 10:11 AM  If 8PM-8AM, please contact night-coverage at [email or web address], password [facility]

## 2013-11-10 NOTE — Progress Notes (Signed)
7 Days Post-Op  Subjective:  feels better.  Ambulating in hall.   High NG outputs.  Tolerating TPN.    Objective: Vital signs in last 24 hours: Temp:  [98 F (36.7 [name])-98.4 F (36.9 [name])] 98 F (36.7 [name]) (03/15 0523) Pulse Rate:  [77-84] 77 (03/15 0523) Resp:  [14-21] 21 (03/15 0720) BP: (97-101)/(50-65) 101/65 mmHg (03/15 0523) SpO2:  [96 %-99 %] 97 % (03/15 0720) FiO2 (%):  [36 %] 36 % (03/15 0027) Last BM Date: 11/08/13 PO - none 1500 from the NG yesterday. TNA started 3/12 Drain 58ml Stool scant fluid in bag Afebrile, VSS WBC decreasing Intake/Output from previous day: 03/14 0701 - 03/15 0700 In: 1660 [I.V.:670; IV Piggyback:350; TPN:640] Out: 4092 [Urine:2800; Emesis/NG output:1280; Drains:12] Intake/Output this shift:    General appearance: alert, cooperative, no distress  Resp: BS are diminished in bases GI: Open wound is OK, the Ostomy remains dark and necrotic looking but intact.   Lab Results:   Recent Labs  11/09/13 0422 11/10/13 0546  WBC 20.3* 17.8*  HGB 8.7* 8.4*  HCT 26.7* 25.5*  PLT 382 431*    BMET  Recent Labs  11/09/13 0422 11/10/13 0546  NA 137 136*  K 4.0 4.1  CL 99 98  CO2 30 30  GLUCOSE 145* 150*  BUN 8 11  CREATININE 0.40* 0.38*  CALCIUM 8.3* 8.4   PT/INR No results found for this basename: LABPROT, INR,  in the last 72 hours   Recent Labs Lab 11/08/13 0417  AST 12  ALT 14  ALKPHOS 83  BILITOT 0.5  PROT 5.2*  ALBUMIN 1.6*     Lipase     Component Value Date/Time   LIPASE 8* 11/03/2013 0304     Studies/Results: Ct Angio Chest Pe W/cm &/or Wo Cm  11/08/2013   CLINICAL DATA:  Known right lung malignancy, elevated white blood cell count, clinical suspicion of pulmonary embolism.  EXAM: CT ANGIOGRAPHY CHEST WITH CONTRAST  TECHNIQUE: Multidetector CT imaging of the chest was performed using the standard protocol during bolus administration of intravenous contrast. Multiplanar CT image reconstructions and MIPs were obtained  to evaluate the vascular anatomy.  CONTRAST:  161mL OMNIPAQUE IOHEXOL 350 MG/ML SOLN  COMPARISON:  DG CHEST 2 VIEW dated 11/06/2013; CT CHEST W/ CM dated 08/19/2013  FINDINGS: Contrast within the pulmonary arterial tree is normal in appearance. There are no filling defects to suggest an acute pulmonary embolism. The caliber of the thoracic aorta is normal. There is no evidence of a false lumen. The cardiac chambers are mildly enlarged. No bulky mediastinal lymph nodes are demonstrated. There is right hilar lymph node on image 42 which measures 1.3 cm in short axis which has enlarged from 0.9 cm on the previous study. A precarinal lymph node measures 7 mm in short axis. There is no subcarinal lymphadenopathy. There is a small right pleural effusion. There is no left pleural effusion or pericardial effusion.  At lung window settings again demonstrated are bilateral bullous emphysematous changes. A right upper lobe mass is again demonstrated which measures 3.5 cm AP x 3.6 cm transversely has not significantly changed in size since the previous study. There is considerable increased interstitial density posteriorly and inferiorly to this mass extending to the right hilum. This has progressed since the previous study. In the right lower lobe there is a new area of atelectasis with the aforementioned small pleural effusion. The left lung exhibits stable emphysematous changes. No interstitial or alveolar infiltrate is demonstrated. Minimal subsegmental  atelectasis at the left lung base is present.  Within the upper abdomen the liver exhibits decreased density with consistent with fatty infiltration. No focal mass is demonstrated within the visualized portions of the liver. The adrenal glands are not included in the field of view. The esophagogastric tube tip is not included in the field of view.  Review of the MIP images confirms the above findings.  IMPRESSION: 1. There is no evidence of an acute pulmonary embolism or  acute thoracic aortic pathology. 2. The known right upper lobe mass has not significantly changed in size. There is increased subsegmental atelectasis or lymphedema in the lung parenchyma inferior to the lung mass as compared to the previous study. A right hilar lymph node has become more conspicuous since the previous study but is maximal short axis dimension is 1.3 cm. 3. There is a new small right pleural effusion and new right lower lobe consolidation posteriorly worrisome for pneumonia. There is minimal subsegmental atelectasis at the left lung base.   Electronically Signed   By: David  Martinique   On: 11/08/2013 11:01   Ct Abdomen Pelvis W Contrast  11/08/2013   CLINICAL DATA:  Elevated white blood cell count  EXAM: CT ABDOMEN AND PELVIS WITH CONTRAST  TECHNIQUE: Multidetector CT imaging of the abdomen and pelvis was performed using the standard protocol following bolus administration of intravenous contrast.  CONTRAST:  166mL OMNIPAQUE IOHEXOL 350 MG/ML SOLN intravenously. The patient also received oral contrast material.  COMPARISON:  CT ABD/PELVIS W CM dated 11/03/2013  FINDINGS: The patient has undergone sigmoid resection with creation of a left-sided colostomy. There is a percutaneously placed drainage catheter in place within the pelvis. There is a tiny amount of free air visible on images 63-66 adjacent to the open midline incision. The partially contrast filled loops of small bowel exhibit no evidence of obstruction and only a mild ileus type pattern. There is mild thickening of small bowel loops in the right lower quadrant of the abdomen. Small interloop abscesses are demonstrated: On image 66 there is a 2.5 cm dimension fluid collection. On image 70 there is a 1.9 cm diameter fluid collection. On image 69 and there is a 3.7 cm diameter fluid collection. There is free fluid and a small amount of gas in the left paracolic gutter demonstrated best on images 42-52.  The colon is partially distended with  contrast and gas. The remnant rectosigmoid colon is partially distended with fluid. There is gas in the perirectal soft tissues on images 64 through 83. The drainage catheter lies immediately anterior and superior to the gas. The urinary bladder is moderately distended and contains gas as well as fluid. The uterus is surgically absent. No adnexal masses are demonstrated.  The liver exhibits no focal mass nor significant ductal dilation. The gallbladder is mildly distended. A small amount of extraluminal gas lies adjacent to the gallbladder. The stomach is nondistended and a nasogastric tube is present with the tip in the midbody of the stomach. The spleen is not enlarged. The pancreas, adrenal glands, and kidneys are normal in appearance. The caliber of the abdominal aorta is normal.  The lumbar spine and bony pelvis exhibit no acute abnormalities. There is mildly increased density in the subcutaneous fat diffusely likely secondary to the patient's volume and protein status. There is a small right pleural effusion and trace left pleural effusion. There is right lower lobe atelectasis or pneumonia.  IMPRESSION: 1. Since the previous study the patient has undergone sigmoid colectomy  and creation of left-sided colostomy for perforated diverticulitis. A drainage catheter has been placed in the pelvis. Previously described interloop abscesses are again demonstrated but appear to have decreased in size. There remains free fluid and free air within the peritoneal cavity. 2. There is no evidence of a small or large bowel obstruction. Mild thickening of small bowel loops in the right lower quadrant of the abdomen is demonstrated. There is a moderate amount of stool and gas in the distal most portion of the descending colon just proximal to the colostomy. 3. The kidneys exhibit no evidence of obstruction or inflammatory changes. There is gas as well as urine within the urinary bladder. This may be due to recent  catheterization but the possibility of infection or fistulization is raised. 4. There is no acute hepatobiliary abnormality. 5. There small bilateral pleural effusions and there is right lower lobe atelectasis or pneumonia.   Electronically Signed   By: David  Martinique   On: 11/08/2013 14:02    Medications: . heparin subcutaneous  5,000 Units Subcutaneous 3 times per day  . insulin aspart  0-9 Units Subcutaneous 4 times per day  . morphine   Intravenous 6 times per day  . pantoprazole (PROTONIX) IV  40 mg Intravenous Q24H  . piperacillin-tazobactam (ZOSYN)  IV  3.375 g Intravenous Q8H  . vancomycin  500 mg Intravenous Q12H    Assessment/Plan Perforated sigmoid colon with multiple abscesses  Emergent exploratory laparotomy, drainage of intra-abdominal abscesses, mobilization of splenic flexure, sigmoid colectomy, colostomy---Dr. Zella Richer 11/03/13  Hx metastatic lung cancer with thoracic radiotherapy--Dr. [name]  She has been skipping treatments because she felt so bad, she had treatment 5/25 yesterday afternoon COPD/Tobacco use hx  Hypertension  Anemia Deconditioning and Malnutrition (prealbumin 6.7 and started on TNA 11/07/13) Prolonged post op ileus Leukocytoisis   Plan:  WBC trending down today, cont vanc and zosyn. [name] diff neg.  Appreciate hospitalist assistance.  Cont TPN and NG for now.  Await return of bowel function.    LOS: 7 days    Alexis [name]. 04/[birth year]33

## 2013-11-11 ENCOUNTER — Ambulatory Visit: Payer: BC Managed Care – PPO

## 2013-11-11 ENCOUNTER — Ambulatory Visit
Admit: 2013-11-11 | Discharge: 2013-11-11 | Disposition: A | Discharge status: Home / Self Care | Payer: BC Managed Care – PPO | Attending: Radiation Oncology | Admitting: Radiation Oncology

## 2013-11-11 LAB — DIFFERENTIAL
Basophils Absolute: 0 10*3/uL (ref 0.0–0.1)
Basophils Relative: 0 % (ref 0–1)
Eosinophils Absolute: 0.2 10*3/uL (ref 0.0–0.7)
Eosinophils Relative: 1 % (ref 0–5)
Lymphocytes Relative: 7 % — ABNORMAL LOW (ref 12–46)
Lymphs Abs: 1.3 10*3/uL (ref 0.7–4.0)
Monocytes Absolute: 1.9 10*3/uL — ABNORMAL HIGH (ref 0.1–1.0)
Monocytes Relative: 10 % (ref 3–12)
Neutro Abs: 15.8 10*3/uL — ABNORMAL HIGH (ref 1.7–7.7)
Neutrophils Relative %: 82 % — ABNORMAL HIGH (ref 43–77)

## 2013-11-11 LAB — GLUCOSE, CAPILLARY
Glucose-Capillary: 127 mg/dL — ABNORMAL HIGH (ref 70–99)
Glucose-Capillary: 136 mg/dL — ABNORMAL HIGH (ref 70–99)
Glucose-Capillary: 139 mg/dL — ABNORMAL HIGH (ref 70–99)
Glucose-Capillary: 143 mg/dL — ABNORMAL HIGH (ref 70–99)
Glucose-Capillary: 148 mg/dL — ABNORMAL HIGH (ref 70–99)
Glucose-Capillary: 171 mg/dL — ABNORMAL HIGH (ref 70–99)

## 2013-11-11 LAB — CBC
HCT: 26.1 % — ABNORMAL LOW (ref 36.0–46.0)
Hemoglobin: 8.6 g/dL — ABNORMAL LOW (ref 12.0–15.0)
MCH: 31.6 pg (ref 26.0–34.0)
MCHC: 33 g/dL (ref 30.0–36.0)
MCV: 96 fL (ref 78.0–100.0)
Platelets: 463 10*3/uL — ABNORMAL HIGH (ref 150–400)
RBC: 2.72 MIL/uL — ABNORMAL LOW (ref 3.87–5.11)
RDW: 16.3 % — ABNORMAL HIGH (ref 11.5–15.5)
WBC: 19.2 10*3/uL — ABNORMAL HIGH (ref 4.0–10.5)

## 2013-11-11 LAB — COMPREHENSIVE METABOLIC PANEL
ALT: 14 U/L (ref 0–35)
AST: 11 U/L (ref 0–37)
Albumin: 1.7 g/dL — ABNORMAL LOW (ref 3.5–5.2)
Alkaline Phosphatase: 162 U/L — ABNORMAL HIGH (ref 39–117)
BUN: 13 mg/dL (ref 6–23)
CO2: 30 mEq/L (ref 19–32)
Calcium: 8.5 mg/dL (ref 8.4–10.5)
Chloride: 95 mEq/L — ABNORMAL LOW (ref 96–112)
Creatinine, Ser: 0.41 mg/dL — ABNORMAL LOW (ref 0.50–1.10)
GFR calc Af Amer: >90 mL/min (ref >90)
GFR calc non Af Amer: >90 mL/min (ref >90)
Glucose, Bld: 134 mg/dL — ABNORMAL HIGH (ref 70–99)
Potassium: 3.9 mEq/L (ref 3.7–5.3)
Sodium: 134 mEq/L — ABNORMAL LOW (ref 137–147)
Total Bilirubin: 0.3 mg/dL (ref 0.3–1.2)
Total Protein: 5.8 g/dL — ABNORMAL LOW (ref 6.0–8.3)

## 2013-11-11 LAB — PREALBUMIN: Prealbumin: 13.8 mg/dL — ABNORMAL LOW (ref 17.0–34.0)

## 2013-11-11 LAB — PHOSPHORUS: Phosphorus: 4 mg/dL (ref 2.3–4.6)

## 2013-11-11 LAB — MAGNESIUM: Magnesium: 1.9 mg/dL (ref 1.5–2.5)

## 2013-11-11 LAB — TRIGLYCERIDES: Triglycerides: 130 mg/dL (ref <150)

## 2013-11-11 MED ORDER — FAT EMULSION 20 % IV EMUL
250.0000 mL | INTRAVENOUS | Status: AC
  Administered 2013-11-11: 250 mL via INTRAVENOUS
  Filled 2013-11-11: qty 250

## 2013-11-11 MED ORDER — TRACE MINERALS CR-CU-F-FE-I-MN-MO-SE-ZN IV SOLN
INTRAVENOUS | Status: AC
  Administered 2013-11-11: 18:00:00 via INTRAVENOUS
  Filled 2013-11-11: qty 2000

## 2013-11-11 NOTE — Progress Notes (Addendum)
Patient is alert and oriented and vital signs are stable. Patient's NG tube was clamped in the morning and patient denies nausea. Patient ambulated in the hall way around the nursing station once with the assistance of physical therapist, patient tolerated it but stated was tired. Patient is NPO, has not passed flatus and has not had any bowel movement. Patient has a JP drain on left lower quadrant with serosanguineous drainage. Patient is currently gone for a radiation treatment for metastatic lung cancer to her brain and skull. (student Nurse) 11/11/2013. 1645  Co-signed Jenne Campus RN BSN-BC

## 2013-11-11 NOTE — Progress Notes (Signed)
8 Days Post-Op  Subjective: She is sore, but not really complaining much, except of a sore throat and tender abdomen.  NG drainage is clear, green. Still no change in the ostomy output, red tinted cloudy fluid.  Objective: Vital signs in last 24 hours: Temp:  [98.1 F (36.7 C)-98.3 F (36.8 C)] 98.1 F (36.7 C) (03/16 0430) Pulse Rate:  [80-89] 80 (03/16 0430) Resp:  [13-18] 18 (03/16 0430) BP: (95-107)/(57-62) 95/57 mmHg (03/16 0430) SpO2:  [93 %-99 %] 93 % (03/16 0430) Last BM Date: 11/08/13 NG 1200 ml Nothing from the drain, no ostomy output recorded. Afebrile, VSS, BP upper 90's-110 range. WBC is still up 19.2, left shift. Anemia stable Intake/Output from previous day: 03/15 0701 - 03/16 0700 In: 240 [I.V.:240] Out: 3550 [Urine:2350; Emesis/NG output:1200] Intake/Output this shift:    General appearance: alert, cooperative and no distress Resp: still diminished. GI: soft, still tender and sore.  The open wound has some fat necrosis at the base with some drainage on the dressing.  I opened an area in the lower portion of the wound with applicator stick.  Bowel sounds are hypoactive. Drainage from the ostomy is more sweat with some cloudy fluid, red tint to it.  The ostomy does not appear different from last week.    Lab Results:   Recent Labs  11/10/13 0546 11/11/13 0330  WBC 17.8* 19.2*  HGB 8.4* 8.6*  HCT 25.5* 26.1*  PLT 431* 463*    BMET  Recent Labs  11/10/13 0546 11/11/13 0330  NA 136* 134*  K 4.1 3.9  CL 98 95*  CO2 30 30  GLUCOSE 150* 134*  BUN 11 13  CREATININE 0.38* 0.41*  CALCIUM 8.4 8.5   PT/INR No results found for this basename: LABPROT, INR,  in the last 72 hours   Recent Labs Lab 11/08/13 0417 11/11/13 0330  AST 12 11  ALT 14 14  ALKPHOS 83 162*  BILITOT 0.5 0.3  PROT 5.2* 5.8*  ALBUMIN 1.6* 1.7*     Lipase     Component Value Date/Time   LIPASE 8* 11/03/2013 0304     Studies/Results: No results  found.  Medications: . heparin subcutaneous  5,000 Units Subcutaneous 3 times per day  . insulin aspart  0-9 Units Subcutaneous 4 times per day  . morphine   Intravenous 6 times per day  . pantoprazole (PROTONIX) IV  40 mg Intravenous Q24H  . piperacillin-tazobactam (ZOSYN)  IV  3.375 g Intravenous Q8H  . vancomycin  1,000 mg Intravenous Q12H    Assessment/Plan 1.  Perforated sigmoid colon with multiple abscesses  Emergent exploratory laparotomy, drainage of intra-abdominal abscesses, mobilization of splenic flexure, sigmoid colectomy, colostomy---Dr. Zella Richer 11/03/13  2.  Hx metastatic lung cancer with thoracic radiotherapy--Dr. [name]   She has been skipping treatments because she felt so bad, she had  treatment 5/25 yesterday afternoon  3.  COPD/Tobacco use hx  4.  Hypertension  5.  Anemia, this is new  5.  Deconditioning and Malnutrition (prealbumin 6.7 and started on TNA  11/07/13)  7.  Post op ileus  8.  Leukocytosis/possible Healthcare associated pneumonia (8days of  Zosyn 3 days of vancomycin.)   Plan:  I appreciate Dr. Lyman Speller help.  I am going to clamp the NG just to se if this helps with some ostomy output.   Continue TNA, IV antibiotics, mobilizing, she says she got to nursing station yesterday.  She is due for another radiation rx today.  I will  have Dr. Zella Richer look at the ostomy site today also.   LOS: 8 days    Alexis [name] 11/11/2013

## 2013-11-11 NOTE — Progress Notes (Signed)
[facility] NOTE   Pharmacy Consult for TNA Indication: prolonged post-operative ileus  Allergies  Allergen Reactions  . Codeine Nausea And Vomiting    Patient Measurements: Height: _0  (162.6 cm) Weight: 121 lb 11.1 oz (55.2 kg) IBW/kg (Calculated) : 54.7  Vital Signs: Temp: 98.1 F (36.7 C) (03/16 0430) Temp src: Oral (03/16 0430) BP: 95/57 mmHg (03/16 0430) Pulse Rate: 80 (03/16 0430) Intake/Output from previous day: 03/15 0701 - 03/16 0700 In: 240 [I.V.:240] Out: 3550 [Urine:2350; Emesis/NG output:1200] Intake/Output from this shift: Total I/O In: -  Out: 1450 [Urine:950; Emesis/NG output:500]  Labs:  Recent Labs  11/09/13 0422 11/10/13 0546 11/11/13 0330  WBC 20.3* 17.8* 19.2*  HGB 8.7* 8.4* 8.6*  HCT 26.7* 25.5* 26.1*  PLT 382 431* 463*     Recent Labs  11/09/13 0422 11/10/13 0546 11/11/13 0330  NA 137 136* 134*  K 4.0 4.1 3.9  CL 99 98 95*  CO2 _1 GLUCOSE 145* 150* 134*  BUN _2 CREATININE 0.40* 0.38* 0.41*  CALCIUM 8.3* 8.4 8.5  MG 1.8 1.9 1.9  PHOS 3.2 3.6 4.0  PROT  --   --  5.8*  ALBUMIN  --   --  1.7*  AST  --   --  11  ALT  --   --  14  ALKPHOS  --   --  162*  BILITOT  --   --  0.3  Corrected Calcium 3/16 = 10.3 Estimated Creatinine Clearance: 63 ml/min (by C-G formula based on Cr of 0.41).    Recent Labs  11/10/13 1813 11/10/13 2357 11/11/13 0500  GLUCAP 127* 148* 139*    Medical History: Past Medical History  Diagnosis Date  . Cancer     lung, hx cervicle    Medications:  Scheduled:  . heparin subcutaneous  5,000 Units Subcutaneous 3 times per day  . insulin aspart  0-9 Units Subcutaneous 4 times per day  . morphine   Intravenous 6 times per day  . pantoprazole (PROTONIX) IV  40 mg Intravenous Q24H  . piperacillin-tazobactam (ZOSYN)  IV  3.375 g Intravenous Q8H  . vancomycin  1,000 mg Intravenous Q12H   Infusions:  . dextrose 5% lactated ringers with KCl 20 mEq/L 20 mL/hr  (11/09/13 1800)  . Marland KitchenTPN (CLINIMIX-E) Adult 70 mL/hr at 11/10/13 1713   And  . 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  On 3/8 underwent emergency exploratory laparotomy, drainage of intra-abdominal abscesses, mobilization of splenic flexure, sigmoid colectomy, colostomy.   Bowel function has not yet returned and baseline prealbumin was 6.7.  TNA initiated 3/12 with pharmacy assistance requested.  Nutritional Goals: (per RD assessment 3/12) 80-100 grams of protein per day, 1600 - 1750 kCal/day  Goal TNA Formula and Regimen: Clinimix-E 5/15 at 70 mL/hr + Fat Emulsion 20% at 10 mL/hr to deliver 84 grams protein/day, 1672 kCal/day  3/16: TNA Access:  PICC On TNA Day #5 (3/12 -  present)  Patient tolerating goal rate of TNA.  Continued NG output (1200 mL output recorded yesterday, 500 mL recorded today already).  Labs: Glucose/CBGs: < 150, controlled with minimal SSI so far.  No hx of DM. Electrolytes: Na 134, Cl 95, other lytes WNL Renal:   SCr below LLN. BUN WNL. Hepatic:  Alk Phos up slightly to 162, but still < 2x ULN.  Other LFTs < ULN. Triglycerides: 102 (3/13), pending today Prealbumin:  6.7 (3/11), 7.2 (3/13), pending today  Plan:  1. At 1800 tonight:      -Continue Clinimix-E 5/15 at goal rate of 70 mL/hr      -Continue Fat Emulsion 20% at 10 mL/hr.      -Continue Multivitamins in TNA daily.        -Continue Trace Elements MWF only due to  national shortage. 2. Continue mIVF at [facility]. 3. Continue CBGs q6h, cover with sensitive-scale SSI. 4. Full TNA labs every Monday and Thursday. 5. F/u daily.  Clayburn Pert, PharmD, BCPS Pager: [phone] 11/11/2013  6:51 AM

## 2013-11-11 NOTE — Progress Notes (Signed)
Patient seen and examined.  She had multiple interloop abscesses and multiple dilated and inflamed loops of small intestine so the prolonged ileus is not surprising.  As for the colostomy, the surface appears dark but it looks viable down in the lumen.  Would not do anything about this now.

## 2013-11-11 NOTE — Progress Notes (Signed)
[facility] PROGRESS NOTE  Alexis [name] UXN:[id number] DOB: 29-Apr-1951 DOA: 11/03/2013  PCP: [name], MD  Reason for Consult: Leukocytosis and Possible Pneumonia  Brief HPI: Alexis [name] is a 63 y.o. 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 She presented to the hospital with abdominal distention and pain and was found to have pneumoperitoneum. She underwent emergent surgery on March 8 and was found to have perforated sigmoid colon with multiple intra-abdominal abscess ease. She underwent drainage of these abscesses, sigmoid colectomy and colostomy. She's currently on TPN. She is getting radiation treatments on a daily basis for her lung cancer and brain mets. It was noted by the surgical staff that her WBC was increasing. She underwent a CT scan of her chest, Abdomen, pelvis and was noted to have small pleural effusion and possibility of an infiltrate in the right lower lung. The abscesses in the abdomen appeared to be improving.   Procedures: Exploratory lap, sigmoid colectomy, abscess drainage 3/8  Antibiotics: Zosyn 3/8--> Vanc 3/13-->  Subjective: Patient continues to feels well. Some sore throat from NG tube. Denies any cough or shortness of breath. Ambulated.  Objective: Vital Signs  Filed Vitals:   11/10/13 1554 11/10/13 2145 11/11/13 0430 11/11/13 0844  BP:  100/59 95/57   Pulse:  89 80   Temp:  98.1 F (36.7 C) 98.1 F (36.7 C)   TempSrc:  Oral Oral   Resp: 16 16 18 15   Height:      Weight:      SpO2: 97% 94% 93% 98%    Intake/Output Summary (Last 24 hours) at 11/11/13 1157 Last data filed at 11/11/13 1030  Gross per 24 hour  Intake   1757 ml  Output   3270 ml  Net  -1513 ml   Filed Weights   11/03/13 0021 11/04/13 0400 11/05/13 0400  Weight: 52.164 kg (115 lb) 54.3 kg (119 lb 11.4 oz) 55.2 kg (121 lb 11.1 oz)   General appearance: alert, cooperative,  appears stated age and no distress Resp: decrease air entry at bases without wheezing or crackles Cardio: regular rate and rhythm, S1, S2 normal, no murmur, click, rub or gallop GI: tender from recent surgery, dressing noted, BS present. Ostomy noted. Extremities: extremities normal, atraumatic, no cyanosis or edema Neurologic: no focal deficits  Lab Results:  Basic Metabolic Panel:  Recent Labs Lab 11/07/13 0455 11/08/13 0417 11/09/13 0422 11/10/13 0546 11/11/13 0330  NA 139 139 137 136* 134*  K 3.9 3.7 4.0 4.1 3.9  CL 98 96 99 98 95*  CO2 34* 33* 30 30 30   GLUCOSE 97 130* 145* 150* 134*  BUN 6 6 8 11 13   CREATININE 0.45* 0.39* 0.40* 0.38* 0.41*  CALCIUM 8.2* 8.3* 8.3* 8.4 8.5  MG  --  1.5 1.8 1.9 1.9  PHOS  --  4.1 3.2 3.6 4.0   Liver Function Tests:  Recent Labs Lab 11/08/13 0417 11/11/13 0330  AST 12 11  ALT 14 14  ALKPHOS 83 162*  BILITOT 0.5 0.3  PROT 5.2* 5.8*  ALBUMIN 1.6* 1.7*   CBC:  Recent Labs Lab 11/08/13 0417 11/08/13 1743 11/09/13 0422 11/10/13 0546 11/11/13 0330  WBC 19.2* 22.3* 20.3* 17.8* 19.2*  NEUTROABS 16.8*  --   --   --  15.8*  HGB 9.0* 8.9* 8.7* 8.4* 8.6*  HCT 28.0* 26.5* 26.7* 25.5* 26.1*  MCV 94.9 92.7 95.0 94.4 96.0  PLT  393 384 382 431* 463*   CBG:  Recent Labs Lab 11/10/13 1144 11/10/13 1813 11/10/13 2357 11/11/13 0500 11/11/13 1126  GLUCAP 161* 127* 148* 139* 143*    Studies/Results: No results found.  Medications:  Scheduled: . heparin subcutaneous  5,000 Units Subcutaneous 3 times per day  . insulin aspart  0-9 Units Subcutaneous 4 times per day  . morphine   Intravenous 6 times per day  . pantoprazole (PROTONIX) IV  40 mg Intravenous Q24H  . piperacillin-tazobactam (ZOSYN)  IV  3.375 g Intravenous Q8H  . vancomycin  1,000 mg Intravenous Q12H   Continuous: . dextrose 5% lactated ringers with KCl 20 mEq/L 20 mL/hr (11/09/13 1800)  . Marland KitchenTPN (CLINIMIX-E) Adult 70 mL/hr at 11/10/13 1713   And  . fat  emulsion 250 mL (11/10/13 1713)  . Marland KitchenTPN (CLINIMIX-E) Adult     And  . fat emulsion     XIH:[id number], diphenhydrAMINE, naloxone, ondansetron (ZOFRAN) IV, ondansetron (ZOFRAN) IV, ondansetron, phenol, sodium chloride, sodium chloride  Assessment/Plan:  Active Problems:   Pneumoperitoneum   Perforation of sigmoid colon   Leukocytosis, unspecified   HCAP (healthcare-associated pneumonia)    Leukocytosis WBC remains high. No clear etiology for the same. Recent CT scan suggested pneumonia. She is however, asymptomatic. She doesn't have any obvious rash. The urinalysis was unremarkable. The intra-abdominal abscesses appeared to have decreased in size. No cultures available from theses abscesses. C. Difficile PCR is negative. Blood cultures are negative as well. Treat possible pneumonia as below. Leukocytosis could also be due to underlying state of inflammation from her abdominal issues. No other source of infection is identified. Might take more than just a few days to resolve.  Possible Healthcare Associated Pneumonia She was already on Zosyn. Vancomycin was added 3/13. Blood cultures obtained and negative so far. Incentive spirometry. Oxygen as needed. Stable from respiratory standpoint.  Metastatic Lung Cancer Management as per oncology and radiation oncologist. She is getting radiation treatments.  Perforation of sigmoid colon status post sigmoid colectomy with several intra-abdominal abscesses Management per surgery.   Anemia Likely from recent acute illness. Per RN some blood was noted through colostomy a few days ago. None recently. Hgb appears to stable. Continue to monitor.  [facility] will follow daily. Please call if there are any questions.   LOS: 8 days   [facility] Pager [phone] 11/11/2013, 11:57 AM  If 8PM-8AM, please contact night-coverage at [email or web address], password [facility]

## 2013-11-11 NOTE — Progress Notes (Signed)
[facility] Therapy Treatment Record Phone [phone]   Radiation Therapy was administered to Alexis [name] on: 11/11/2013  4:54 PM and was treatment # 7 out of a planned course of 25 treatments.

## 2013-11-11 NOTE — Progress Notes (Signed)
Physical Therapy Treatment Patient Details Name: Alexis [name] MRN: [id number] DOB: [birth year] Today's Date: 11/11/2013 Time: 1660-6004 PT Time Calculation (min): 26 min  PT Assessment / Plan / Recommendation  History of Present Illness     PT Comments   Pt progressing well and very cooperative and pleasant.  Fatigues quickly however, after ambulation pt was too tired to perform any exercises and wished to rest (however also with scheduled radiation tx around 11am).  Follow Up Recommendations  Home health PT;Supervision/Assistance - 24 hour     Does the patient have the potential to tolerate intense rehabilitation     Barriers to Discharge        Equipment Recommendations  None recommended by PT    Recommendations for Other Services    Frequency Min 3X/week   Progress towards PT Goals Progress towards PT goals: Progressing toward goals  Plan Current plan remains appropriate    Precautions / Restrictions Precautions Precautions: Fall Precaution Comments: multiple lines/leads, drains, NG tube, O2   Pertinent Vitals/Pain Encouraged use of PCA, ambulated on 2L O2    Mobility  Bed Mobility Overal bed mobility: Needs Assistance Bed Mobility: Supine to Sit;Sit to Supine Supine to sit: Min guard Sit to supine: Min guard General bed mobility comments: use of rail and increased time MinGuard assist for multiple lines/tubing Transfers Overall transfer level: Needs assistance Equipment used: Rolling walker (2 wheeled) Transfers: Sit to/from Stand Sit to Stand: Min guard General transfer comment: pt safely transferred to [facility] from bed prior to ambulating, assist for managing lines Ambulation/Gait Ambulation/Gait assistance: Min guard Ambulation Distance (Feet): 500 Feet Assistive device: Rolling walker (2 wheeled) Gait Pattern/deviations: Step-through pattern;Trunk flexed;Decreased stride length Gait velocity: WFL General Gait Details: prefers use of RW to steady.  verbal cues  for posture to improve trunk flexion to better upright posture depending on pain    Exercises     PT Diagnosis:    PT Problem List:   PT Treatment Interventions:     PT Goals (current goals can now be found in the care plan section)    Visit Information  Last PT Received On: 11/11/13 Assistance Needed: +1    Subjective Data      Cognition  Cognition Arousal/Alertness: Awake/alert Behavior During Therapy: [facility] for tasks assessed/performed Overall Cognitive Status: Within Functional Limits for tasks assessed    Balance     End of Session PT - End of Session Equipment Utilized During Treatment: Oxygen Activity Tolerance: Patient limited by fatigue Patient left: in bed;with call bell/phone within reach   GP     Tida Saner,KATHrine E 11/11/2013, 1:55 PM Carmelia Bake, PT, DPT 11/11/2013 Pager: [phone]

## 2013-11-12 ENCOUNTER — Ambulatory Visit
Admit: 2013-11-12 | Discharge: 2013-11-12 | Disposition: A | Discharge status: Home / Self Care | Payer: BC Managed Care – PPO | Attending: Radiation Oncology | Admitting: Radiation Oncology

## 2013-11-12 ENCOUNTER — Ambulatory Visit: Payer: BC Managed Care – PPO

## 2013-11-12 LAB — GLUCOSE, CAPILLARY
Glucose-Capillary: 133 mg/dL — ABNORMAL HIGH (ref 70–99)
Glucose-Capillary: 130 mg/dL — ABNORMAL HIGH (ref 70–99)
Glucose-Capillary: 141 mg/dL — ABNORMAL HIGH (ref 70–99)

## 2013-11-12 LAB — VANCOMYCIN, TROUGH: Vancomycin Tr: 11.1 ug/mL (ref 10.0–20.0)

## 2013-11-12 MED ORDER — VANCOMYCIN HCL 10 G IV SOLR
1500.0000 mg | Freq: Two times a day (BID) | INTRAVENOUS | Status: DC
  Administered 2013-11-12 – 2013-11-14 (×5): 1500 mg via INTRAVENOUS
  Filled 2013-11-12 (×7): qty 1500

## 2013-11-12 MED ORDER — M.V.I. ADULT IV INJ
INJECTION | INTRAVENOUS | Status: AC
  Administered 2013-11-12: 17:00:00 via INTRAVENOUS
  Filled 2013-11-12: qty 2000

## 2013-11-12 MED ORDER — FAT EMULSION 20 % IV EMUL
250.0000 mL | INTRAVENOUS | Status: AC
  Administered 2013-11-12: 250 mL via INTRAVENOUS
  Filled 2013-11-12: qty 250

## 2013-11-12 NOTE — Progress Notes (Signed)
Pt ambulated around unit x 1. Tolerated with no SOB or complaints. Madelin Headings

## 2013-11-12 NOTE — Progress Notes (Signed)
[facility] PROGRESS NOTE  Alexis [name] OFB:[id number] DOB: 01-Mar-1951 DOA: 11/03/2013  PCP: [name], MD  Reason for Consult: Leukocytosis and Possible Pneumonia  Brief HPI: Alexis [name] is a 63 y.o. 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 She presented to the hospital with abdominal distention and pain and was found to have pneumoperitoneum. She underwent emergent surgery on March 8 and was found to have perforated sigmoid colon with multiple intra-abdominal abscess ease. She underwent drainage of these abscesses, sigmoid colectomy and colostomy. She's currently on TPN. She is getting radiation treatments on a daily basis for her lung cancer and brain mets. It was noted by the surgical staff that her WBC was increasing. She underwent a CT scan of her chest, Abdomen, pelvis and was noted to have small pleural effusion and possibility of an infiltrate in the right lower lung. The abscesses in the abdomen appeared to be improving.   Procedures: Exploratory lap, sigmoid colectomy, abscess drainage 3/8  Antibiotics: Zosyn 3/8--> Vanc 3/13-->  Subjective: Patient continues to feels well. Denies any cough or shortness of breath. Ambulated.  Objective: Vital Signs  Filed Vitals:   11/11/13 2303 11/12/13 0348 11/12/13 0530 11/12/13 0800  BP:   98/60   Pulse:   93   Temp:   97.6 F (36.4 C)   TempSrc:   Oral   Resp: 14 12 16 18   Height:      Weight:      SpO2: 95% 100% 100% 98%    Intake/Output Summary (Last 24 hours) at 11/12/13 0953 Last data filed at 11/12/13 0900  Gross per 24 hour  Intake   1950 ml  Output   2900 ml  Net   -950 ml   Filed Weights   11/03/13 0021 11/04/13 0400 11/05/13 0400  Weight: 52.164 kg (115 lb) 54.3 kg (119 lb 11.4 oz) 55.2 kg (121 lb 11.1 oz)   General appearance: alert, cooperative, appears stated age and no distress Resp: decrease air  entry at bases without wheezing or crackles Cardio: regular rate and rhythm, S1, S2 normal, no murmur, click, rub or gallop GI: tender from recent surgery, dressing noted, BS present. Ostomy noted. Extremities: extremities normal, atraumatic, no cyanosis or edema Neurologic: no focal deficits  Lab Results:  Basic Metabolic Panel:  Recent Labs Lab 11/07/13 0455 11/08/13 0417 11/09/13 0422 11/10/13 0546 11/11/13 0330  NA 139 139 137 136* 134*  K 3.9 3.7 4.0 4.1 3.9  CL 98 96 99 98 95*  CO2 34* 33* 30 30 30   GLUCOSE 97 130* 145* 150* 134*  BUN 6 6 8 11 13   CREATININE 0.45* 0.39* 0.40* 0.38* 0.41*  CALCIUM 8.2* 8.3* 8.3* 8.4 8.5  MG  --  1.5 1.8 1.9 1.9  PHOS  --  4.1 3.2 3.6 4.0   Liver Function Tests:  Recent Labs Lab 11/08/13 0417 11/11/13 0330  AST 12 11  ALT 14 14  ALKPHOS 83 162*  BILITOT 0.5 0.3  PROT 5.2* 5.8*  ALBUMIN 1.6* 1.7*   CBC:  Recent Labs Lab 11/08/13 0417 11/08/13 1743 11/09/13 0422 11/10/13 0546 11/11/13 0330  WBC 19.2* 22.3* 20.3* 17.8* 19.2*  NEUTROABS 16.8*  --   --   --  15.8*  HGB 9.0* 8.9* 8.7* 8.4* 8.6*  HCT 28.0* 26.5* 26.7* 25.5* 26.1*  MCV 94.9 92.7 95.0 94.4 96.0  PLT 393 384 382 431* 463*   CBG:  Recent Labs Lab 11/11/13 0500 11/11/13 1126 11/11/13 1824 11/11/13 2345 11/12/13 0621  GLUCAP 139* 143* 136* 171* 133*    Studies/Results: No results found.  Medications:  Scheduled: . heparin subcutaneous  5,000 Units Subcutaneous 3 times per day  . insulin aspart  0-9 Units Subcutaneous 4 times per day  . morphine   Intravenous 6 times per day  . pantoprazole (PROTONIX) IV  40 mg Intravenous Q24H  . piperacillin-tazobactam (ZOSYN)  IV  3.375 g Intravenous Q8H  . vancomycin  1,000 mg Intravenous Q12H   Continuous: . dextrose 5% lactated ringers with KCl 20 mEq/L 20 mL/hr at 11/11/13 1936  . Marland KitchenTPN (CLINIMIX-E) Adult 70 mL/hr at 11/11/13 1744   And  . fat emulsion 250 mL (11/11/13 1744)  . Marland KitchenTPN (CLINIMIX-E) Adult      And  . fat emulsion     XBJ:[id number], diphenhydrAMINE, naloxone, ondansetron (ZOFRAN) IV, ondansetron (ZOFRAN) IV, ondansetron, phenol, sodium chloride, sodium chloride  Assessment/Plan:  Active Problems:   Pneumoperitoneum   Perforation of sigmoid colon   Leukocytosis, unspecified   HCAP (healthcare-associated pneumonia)    Leukocytosis WBC remains high. No clear etiology for the same. Recent CT scan suggested pneumonia. She is however, asymptomatic. She doesn't have any obvious rash. The urinalysis was unremarkable. The intra-abdominal abscesses appeared to have decreased in size. No cultures available from theses abscesses. C. Difficile PCR is negative. Blood cultures are negative as well. Treat possible pneumonia as below. Leukocytosis could also be due to underlying state of inflammation from her abdominal issues. No other source of infection is identified. Might take more than just a few days to resolve.  Possible Healthcare Associated Pneumonia She was already on Zosyn. Vancomycin was added 3/13. Blood cultures obtained and negative so far. Incentive spirometry. Oxygen as needed. Stable from respiratory standpoint.  Metastatic Lung Cancer Management as per oncology and radiation oncologist. She is getting radiation treatments.  Perforation of sigmoid colon status post sigmoid colectomy with several intra-abdominal abscesses Management per surgery.   Anemia Likely from recent acute illness. Per RN some blood was noted through colostomy a few days ago. None recently. Hgb appears to stable. Continue to monitor.  TRH will follow daily. Please call if there are any questions.   LOS: 9 days   [facility] Pager [phone] 11/12/2013, 9:53 AM  If 8PM-8AM, please contact night-coverage at [email or web address], password [facility]

## 2013-11-12 NOTE — Progress Notes (Signed)
[facility] Therapy Treatment Record Phone [phone]   Radiation Therapy was administered to Alexis [name] on: 11/12/2013  2:00 PM and was treatment # 8 out of a planned course of 25 treatments.

## 2013-11-12 NOTE — Progress Notes (Signed)
[facility] NOTE   Pharmacy Consult for TNA Indication: prolonged post-operative ileus  Allergies  Allergen Reactions  . Codeine Nausea And Vomiting    Patient Measurements: Height: 5' 4"  (162.6 cm) Weight: 121 lb 11.1 oz (55.2 kg) IBW/kg (Calculated) : 54.7  Vital Signs: Temp: 97.6 F (36.4 C) (03/17 0530) Temp src: Oral (03/17 0530) BP: 98/60 mmHg (03/17 0530) Pulse Rate: 93 (03/17 0530) Intake/Output from previous day: 03/16 0701 - 03/17 0700 In: 1950 [I.V.:480; NG/GT:30; IV Piggyback:550; TPN:890] Out: 2850 [Urine:2800; Drains:50] Intake/Output from this shift:    Labs:  Recent Labs  11/10/13 0546 11/11/13 0330  WBC 17.8* 19.2*  HGB 8.4* 8.6*  HCT 25.5* 26.1*  PLT 431* 463*     Recent Labs  11/10/13 0546 11/11/13 0330 11/11/13 0331  NA 136* 134*  --   K 4.1 3.9  --   CL 98 95*  --   CO2 30 30  --   GLUCOSE 150* 134*  --   BUN 11 13  --   CREATININE 0.38* 0.41*  --   CALCIUM 8.4 8.5  --   MG 1.9 1.9  --   PHOS 3.6 4.0  --   PROT  --  5.8*  --   ALBUMIN  --  1.7*  --   AST  --  11  --   ALT  --  14  --   ALKPHOS  --  162*  --   BILITOT  --  0.3  --   PREALBUMIN  --  13.8*  --   TRIG  --   --  130  Corrected Calcium 3/16 = 10.3 Estimated Creatinine Clearance: 63 ml/min (by C-G formula based on Cr of 0.41).    Recent Labs  11/11/13 1824 11/11/13 2345 11/12/13 0621  GLUCAP 136* 171* 133*    Medical History: Past Medical History  Diagnosis Date  . Cancer     lung, hx cervicle    Medications:  Scheduled:  . heparin subcutaneous  5,000 Units Subcutaneous 3 times per day  . insulin aspart  0-9 Units Subcutaneous 4 times per day  . morphine   Intravenous 6 times per day  . pantoprazole (PROTONIX) IV  40 mg Intravenous Q24H  . piperacillin-tazobactam (ZOSYN)  IV  3.375 g Intravenous Q8H  . vancomycin  1,000 mg Intravenous Q12H   Infusions:  . dextrose 5% lactated ringers with KCl 20 mEq/L 20 mL/hr at 11/11/13 1936   . Marland KitchenTPN (CLINIMIX-E) Adult 70 mL/hr at 11/11/13 1744   And  . 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  On 3/8 underwent emergency exploratory laparotomy, drainage of intra-abdominal abscesses, mobilization of splenic flexure, sigmoid colectomy, colostomy.   Bowel function has not yet returned and baseline prealbumin was 6.7.  TNA initiated 3/12 with pharmacy assistance requested.  Nutritional Goals: (per RD assessment 3/12) 80-100 grams of protein per day, 1600 - 1750 kCal/day  Goal TNA Formula and Regimen:  Clinimix-E 5/15 at 70 mL/hr + Fat Emulsion 20% at 10 mL/hr to deliver 84 grams protein/day, 1672 kCal/day  3/16: TNA Access:  PICC On TNA Day #6 (3/12 - present)  Patient tolerating goal rate of TNA.  NG tubed clamped on 3/16 AM  Labs: Glucose/CBGs: mostly at goal < 150, 1 reading was > 150 (171), controlled with minimal SSI so far.  No hx of DM. Electrolytes: Na 134, Cl 95, other lytes WNL Renal:   SCr below LLN. BUN WNL. Hepatic:  Alk Phos up slightly to 162, but still < 2x ULN.  Other LFTs < ULN. Triglycerides: 102 (3/13), 130 (3/16) Prealbumin:  6.7 (3/11), 7.2 (3/13), 13.8 (3/16)  Plan:  1. At 1800 tonight:      -Continue Clinimix-E 5/15 at goal rate of 70 mL/hr      -Continue Fat Emulsion 20% at 10 mL/hr.      -Continue Multivitamins in TNA daily.        -Continue Trace Elements MWF only due to national shortage. 2.  [facility] at [facility]. 3. Continue CBGs q6h, cover with sensitive-scale SSI. 4. Full TNA labs every Monday and Thursday. 5. F/u daily.  Anay Rathe, Gaye Alken PharmD  Pager #: [phone] 7:33 AM 11/12/2013

## 2013-11-12 NOTE — Progress Notes (Addendum)
Patient is alert and oriented. Vital signs are stable. Patient is tolerating ice chips with no nausea or vomiting. NG tube still remains clamped. Wet to dry dressing changed to midline abdomen at 1800. Incision was mostly beefy red but with a tan creamy base with foul smelling odor. Patient's ostomy with little pink tinged output. JP drain with scant amount of serosanguineous drainage. Marlinda Mike (Student nurse) 11/12/2013. 2002

## 2013-11-12 NOTE — Consult Note (Signed)
WOC ostomy follow up Stoma type/location: LLQ, end colostomy Stomal assessment/size: 1 1/4" flush stoma, dusky stoma but CCS aware and feels viable a little deeper into the os Peristomal assessment: pouch intact and patient with very little bloody drainage. Treatment options for stomal/peristomal skin:     2" barrier ring used for flush stoma Output minimal Ostomy pouching: 1pc in place  Education provided: reviewed lock and roll closure and care of stoma.  Her husband works 2nd shift and normally arrives to the hospital around 12-1pm.  I have arranged to meet the husband tom at 1 o'clock for additional teaching session. Enrolled patient in [place] Start Discharge program: Yes  CCS at bedside to assess patient as well, midline abdominal wound undressed.  WOC to repack midline wound  Wound type: surgical  Wound bed:90% beefy tissue/subcutaneous tissue.  Some yellow/brown slough in the base of the wound. And visible sutures. Drainage (amount, consistency, odor) moderate,serousanginous  Periwound:intact Dressing procedure/placement/frequency: packed midline wound with (3) 4x4's moistened with normal saline.  Covered with ABD pad, secured with tape.   WOC will follow along with you for ostomy teaching [place], [place]

## 2013-11-12 NOTE — Progress Notes (Signed)
ANTIBIOTIC CONSULT NOTE - FOLLOW UP  Pharmacy Consult for Vancomcyin Indication: pneumonia  Allergies  Allergen Reactions  . Codeine Nausea And Vomiting    Patient Measurements: Height: 5\' 4"  (162.6 cm) Weight: 121 lb 11.1 oz (55.2 kg) IBW/kg (Calculated) : 54.7   Vital Signs: Temp: 97.8 F (36.6 C) (03/17 1400) Temp src: Oral (03/17 1400) BP: 98/62 mmHg (03/17 1400) Pulse Rate: 81 (03/17 1400) Intake/Output from previous day: 03/16 0701 - 03/17 0700 In: 1950 [I.V.:480; NG/GT:30; IV Piggyback:550; TPN:890] Out: 2850 [Urine:2800; Drains:50] Intake/Output from this shift: Total I/O In: -  Out: 557 [Urine:550; Drains:7]  Labs:  Recent Labs  11/10/13 0546 11/11/13 0330  WBC 17.8* 19.2*  HGB 8.4* 8.6*  PLT 431* 463*  CREATININE 0.38* 0.41*   Estimated Creatinine Clearance: 63 ml/min (by C-G formula based on Cr of 0.41).  Recent Labs  11/10/13 1700 11/12/13 1140  VANCOTROUGH <5.0* 11.1     Microbiology: Recent Results (from the past 720 hour(s))  URINE CULTURE     Status: None   Collection Time    11/08/13  1:59 PM      Result Value Ref Range Status   Specimen Description URINE, CLEAN CATCH   Final   Special Requests NONE   Final   Culture  Setup Time     Final   Value: 11/09/2013 02:39     Performed at [place]     Final   Value: NO GROWTH     Performed at [facility]   Culture     Final   Value: NO GROWTH     Performed at [facility]   Report Status 11/10/2013 FINAL   Final  CULTURE, BLOOD (ROUTINE X 2)     Status: None   Collection Time    11/08/13  5:00 PM      Result Value Ref Range Status   Specimen Description BLOOD LEFT HAND   Final   Special Requests BOTTLES DRAWN AEROBIC AND ANAEROBIC 10CC   Final   Culture  Setup Time     Final   Value: 11/08/2013 22:07     Performed at [facility]   Culture     Final   Value:        BLOOD CULTURE RECEIVED NO GROWTH TO DATE CULTURE WILL BE HELD  FOR 5 DAYS BEFORE ISSUING A FINAL NEGATIVE REPORT     Performed at [facility]   Report Status PENDING   Incomplete  CULTURE, BLOOD (ROUTINE X 2)     Status: None   Collection Time    11/08/13  5:15 PM      Result Value Ref Range Status   Specimen Description BLOOD LEFT ARM   Final   Special Requests BOTTLES DRAWN AEROBIC AND ANAEROBIC 10CC   Final   Culture  Setup Time     Final   Value: 11/08/2013 22:07     Performed at [facility]   Culture     Final   Value:        BLOOD CULTURE RECEIVED NO GROWTH TO DATE CULTURE WILL BE HELD FOR 5 DAYS BEFORE ISSUING A FINAL NEGATIVE REPORT     Performed at [facility]   Report Status PENDING   Incomplete  CLOSTRIDIUM DIFFICILE BY PCR     Status: None   Collection Time    11/09/13  2:23 AM      Result Value Ref Range  Status   C difficile by pcr NEGATIVE  NEGATIVE Final   Comment: Performed at [place]   Start     Dose/Rate Route Frequency Ordered Stop   11/11/13 0000  vancomycin (VANCOCIN) IVPB 1000 mg/200 mL premix     1,000 mg 200 mL/hr over 60 Minutes Intravenous Every 12 hours 11/10/13 1823     11/09/13 0600  vancomycin (VANCOCIN) IVPB 750 mg/150 ml premix  Status:  Discontinued     750 mg 150 mL/hr over 60 Minutes Intravenous Every 12 hours 11/08/13 1656 11/08/13 1718   11/09/13 0600  vancomycin (VANCOCIN) 500 mg in sodium chloride 0.9 % 100 mL IVPB  Status:  Discontinued     500 mg 100 mL/hr over 60 Minutes Intravenous Every 12 hours 11/08/13 1718 11/10/13 1823   11/08/13 1730  vancomycin (VANCOCIN) IVPB 1000 mg/200 mL premix     1,000 mg 200 mL/hr over 60 Minutes Intravenous  Once 11/08/13 1655 11/08/13 1838   11/03/13 1600  piperacillin-tazobactam (ZOSYN) IVPB 3.375 g     3.375 g 12.5 mL/hr over 240 Minutes Intravenous Every 8 hours 11/03/13 1233     11/03/13 0800  piperacillin-tazobactam (ZOSYN) IVPB 3.375 g  Status:  Discontinued    Comments:  First dose stat.   3.375  g 12.5 mL/hr over 240 Minutes Intravenous Every 8 hours 11/03/13 0734 11/03/13 1233      Assessment: 63 y/o F with stage IV NSCLC admitted 3/8 with perforated sigmoid colon s/p emergent ex lap, drainage of intra-abdominal abscess, sigmoid colectomy, colostomy.  TPN started 3/12.  CT angio chest 3/13 showed new RLL consolidation worrisome for PNA.  Patient had been on Zosyn for intra-abdominal issues since 3/8 and MD ordered Vancomycin to be added 3/13 for possible pneumonia.  Patient is stable from a respiratory standpoint but has ongoing leukocytosis with no clear etiology, continuing to cover for PNA and leukocytosis.  D #10 Zosyn, D#5 Vancomycin.  Vancomycin trough (11.1) Remains below goal of 15-20 despite increasing dose on 3/15  3/8 >> Zosyn (MD) >> 3/13 >> Vancomycin >>  WBCs: 19.2, remains elevated  Renal: SCr 0.41, CrCl ~63 ml/min (CG, using SCr 0.8) Afebrile   Micro 3/13 Urine: NG final 3/13 Blood: NGTD 3/14 Cdiff PCR negative.  Goal of Therapy:  Vancomycin trough level 15-20 mcg/ml Eradication of infection  Plan:  1.) Increase vancomycin to 1500 mg IV q12h 2.) Repeat VT at [facility] 3.) Monitor clinical course   Glee Arvin PharmD Pager #: [phone] 2:34 PM 11/12/2013

## 2013-11-12 NOTE — Progress Notes (Signed)
9 Days Post-Op  Subjective: No nausea with NG clamped yesterday.  I am going to give her ice chips.  Still almost nothing thru the ostomy, BS very quiet.  Wound remains soupy at the base. Ostomy unchanged also.  Objective: Vital signs in last 24 hours: Temp:  [97.6 F (36.4 C)-98.3 F (36.8 C)] 97.6 F (36.4 C) (03/17 0530) Pulse Rate:  [92-97] 93 (03/17 0530) Resp:  [12-20] 16 (03/17 0530) BP: (98-100)/(59-62) 98/60 mmHg (03/17 0530) SpO2:  [95 %-100 %] 100 % (03/17 0530) Last BM Date: 11/07/13 NPO, NG clamped yesterday, and I do not see any output recorded. 50 ml from the drain Afebrile, VSS No labs today, Pre albumin up to 13.8, it started at 6.7 on 3/11. Intake/Output from previous day: 03/16 0701 - 03/17 0700 In: 1950 [I.V.:480; NG/GT:30; IV Piggyback:550; TPN:890] Out: 2850 [Urine:2800; Drains:50] Intake/Output this shift:    General appearance: alert, cooperative and no distress Resp: clear to auscultation bilaterally and anterior exam GI: soft, no bowel sounds, some ongoing cloudy drainage from the ostomy, looks like dirty water.  Base of the open abdominal wound has a soupy, purulent looking stuff, that should come out with dressing changes.  Lab Results:   Recent Labs  11/10/13 0546 11/11/13 0330  WBC 17.8* 19.2*  HGB 8.4* 8.6*  HCT 25.5* 26.1*  PLT 431* 463*    BMET  Recent Labs  11/10/13 0546 11/11/13 0330  NA 136* 134*  K 4.1 3.9  CL 98 95*  CO2 30 30  GLUCOSE 150* 134*  BUN 11 13  CREATININE 0.38* 0.41*  CALCIUM 8.4 8.5   PT/INR No results found for this basename: LABPROT, INR,  in the last 72 hours   Recent Labs Lab 11/08/13 0417 11/11/13 0330  AST 12 11  ALT 14 14  ALKPHOS 83 162*  BILITOT 0.5 0.3  PROT 5.2* 5.8*  ALBUMIN 1.6* 1.7*     Lipase     Component Value Date/Time   LIPASE 8* 11/03/2013 0304     Studies/Results: No results found.  Medications: . heparin subcutaneous  5,000 Units Subcutaneous 3 times per day  .  insulin aspart  0-9 Units Subcutaneous 4 times per day  . morphine   Intravenous 6 times per day  . pantoprazole (PROTONIX) IV  40 mg Intravenous Q24H  . piperacillin-tazobactam (ZOSYN)  IV  3.375 g Intravenous Q8H  . vancomycin  1,000 mg Intravenous Q12H    Assessment/Plan 1. Perforated sigmoid colon with multiple abscesses  Emergent exploratory laparotomy, drainage of intra-abdominal abscesses, mobilization of splenic flexure, sigmoid colectomy, colostomy---Dr. Zella Richer 11/03/13  2. Hx metastatic lung cancer with thoracic radiotherapy--Dr. [name] (treatment #7/25 yesterday) 3. COPD/Tobacco use hx  4. Hypertension  5. Anemia, this is new  5. Deconditioning and Malnutrition (prealbumin 6.7 and started on TNA 11/07/13)  7. Post op ileus  8. Leukocytosis/possible Healthcare associated pneumonia (9days of Zosyn 4 days of vancomycin.)  Plan: Continue TNA, antibiotics, I will check on d/cing NG and give her some ice chips for now. She is walking more and nutritional status is improving.  LOS: 9 days    Alexis [name] 11/12/2013

## 2013-11-12 NOTE — Progress Notes (Signed)
She is on broad spectrum abxs.  If she continues to have a significant leukocytosis, will repeat CT of Abd/Pelvis tomorrow.

## 2013-11-13 ENCOUNTER — Ambulatory Visit
Admit: 2013-11-13 | Discharge: 2013-11-13 | Disposition: A | Discharge status: Home / Self Care | Payer: BC Managed Care – PPO | Attending: Radiation Oncology | Admitting: Radiation Oncology

## 2013-11-13 ENCOUNTER — Ambulatory Visit: Payer: BC Managed Care – PPO

## 2013-11-13 DIAGNOSIS — J449 Chronic obstructive pulmonary disease, unspecified: Secondary | ICD-10-CM

## 2013-11-13 DIAGNOSIS — R222 Localized swelling, mass and lump, trunk: Secondary | ICD-10-CM

## 2013-11-13 DIAGNOSIS — J4489 Other specified chronic obstructive pulmonary disease: Secondary | ICD-10-CM

## 2013-11-13 DIAGNOSIS — K668 Other specified disorders of peritoneum: Secondary | ICD-10-CM

## 2013-11-13 LAB — GLUCOSE, CAPILLARY
Glucose-Capillary: 127 mg/dL — ABNORMAL HIGH (ref 70–99)
Glucose-Capillary: 129 mg/dL — ABNORMAL HIGH (ref 70–99)
Glucose-Capillary: 132 mg/dL — ABNORMAL HIGH (ref 70–99)
Glucose-Capillary: 134 mg/dL — ABNORMAL HIGH (ref 70–99)
Glucose-Capillary: 146 mg/dL — ABNORMAL HIGH (ref 70–99)

## 2013-11-13 LAB — CBC WITH DIFFERENTIAL/PLATELET
Basophils Absolute: 0.2 10*3/uL — ABNORMAL HIGH (ref 0.0–0.1)
Basophils Relative: 1 % (ref 0–1)
Eosinophils Absolute: 0.2 10*3/uL (ref 0.0–0.7)
Eosinophils Relative: 1 % (ref 0–5)
HCT: 25.7 % — ABNORMAL LOW (ref 36.0–46.0)
Hemoglobin: 8.2 g/dL — ABNORMAL LOW (ref 12.0–15.0)
Lymphocytes Relative: 7 % — ABNORMAL LOW (ref 12–46)
Lymphs Abs: 1.2 10*3/uL (ref 0.7–4.0)
MCH: 30.8 pg (ref 26.0–34.0)
MCHC: 31.9 g/dL (ref 30.0–36.0)
MCV: 96.6 fL (ref 78.0–100.0)
Monocytes Absolute: 1.8 10*3/uL — ABNORMAL HIGH (ref 0.1–1.0)
Monocytes Relative: 11 % (ref 3–12)
Neutro Abs: 13.2 10*3/uL — ABNORMAL HIGH (ref 1.7–7.7)
Neutrophils Relative %: 80 % — ABNORMAL HIGH (ref 43–77)
Platelets: 597 10*3/uL — ABNORMAL HIGH (ref 150–400)
RBC: 2.66 MIL/uL — ABNORMAL LOW (ref 3.87–5.11)
RDW: 16.6 % — ABNORMAL HIGH (ref 11.5–15.5)
WBC: 16.6 10*3/uL — ABNORMAL HIGH (ref 4.0–10.5)

## 2013-11-13 MED ORDER — TRACE MINERALS CR-CU-F-FE-I-MN-MO-SE-ZN IV SOLN
INTRAVENOUS | Status: AC
  Administered 2013-11-13: 18:00:00 via INTRAVENOUS
  Filled 2013-11-13: qty 2000

## 2013-11-13 MED ORDER — FAT EMULSION 20 % IV EMUL
250.0000 mL | INTRAVENOUS | Status: AC
  Administered 2013-11-13: 250 mL via INTRAVENOUS
  Filled 2013-11-13: qty 250

## 2013-11-13 MED ORDER — MORPHINE SULFATE 2 MG/ML IJ SOLN
2.0000 mg | INTRAMUSCULAR | Status: DC | PRN
  Administered 2013-11-13 – 2013-11-14 (×6): 2 mg via INTRAVENOUS
  Administered 2013-11-14: 4 mg via INTRAVENOUS
  Administered 2013-11-14 – 2013-11-15 (×2): 2 mg via INTRAVENOUS
  Filled 2013-11-13 (×2): qty 1
  Filled 2013-11-13: qty 2
  Filled 2013-11-13 (×6): qty 1

## 2013-11-13 NOTE — Progress Notes (Signed)
Patient seen and examined.  Agree with PA's note.  

## 2013-11-13 NOTE — Progress Notes (Signed)
Pt ambulated around unit for second time in shift. No complaints or problems with ambulation. Vwilliams,rn.

## 2013-11-13 NOTE — Progress Notes (Signed)
[place] NOTE   Pharmacy Consult for TNA Indication: prolonged post-operative ileus  Allergies  Allergen Reactions  . Codeine Nausea And Vomiting    Patient Measurements: Height: _0  (162.6 cm) Weight: 121 lb 11.1 oz (55.2 kg) IBW/kg (Calculated) : 54.7  Vital Signs: Temp: 97.8 F (36.6 C) (03/18 0540) Temp src: Oral (03/18 0540) BP: 98/70 mmHg (03/18 0540) Pulse Rate: 79 (03/18 0540) Intake/Output from previous day: 03/17 0701 - 03/18 0700 In: 320 [I.V.:320] Out: 1962 [Urine:1950; Drains:12] Intake/Output from this shift:    Labs:  Recent Labs  11/11/13 0330 11/13/13 0530  WBC 19.2* 16.6*  HGB 8.6* 8.2*  HCT 26.1* 25.7*  PLT 463* 597*     Recent Labs  11/11/13 0330 11/11/13 0331  NA 134*  --   K 3.9  --   CL 95*  --   CO2 30  --   GLUCOSE 134*  --   BUN 13  --   CREATININE 0.41*  --   CALCIUM 8.5  --   MG 1.9  --   PHOS 4.0  --   PROT 5.8*  --   ALBUMIN 1.7*  --   AST 11  --   ALT 14  --   ALKPHOS 162*  --   BILITOT 0.3  --   PREALBUMIN 13.8*  --   TRIG  --  130  Corrected Calcium 3/16 = 10.3 Estimated Creatinine Clearance: 63 ml/min (by C-G formula based on Cr of 0.41).    Recent Labs  11/12/13 1822 11/13/13 0009 11/13/13 0635  GLUCAP 141* 129* 134*    Medical History: Past Medical History  Diagnosis Date  . Cancer     lung, hx cervicle    Medications:  Scheduled:  . heparin subcutaneous  5,000 Units Subcutaneous 3 times per day  . insulin aspart  0-9 Units Subcutaneous 4 times per day  . pantoprazole (PROTONIX) IV  40 mg Intravenous Q24H  . piperacillin-tazobactam (ZOSYN)  IV  3.375 g Intravenous Q8H  . vancomycin  1,500 mg Intravenous Q12H   Infusions:  . dextrose 5% lactated ringers with KCl 20 mEq/L 20 mL/hr at 11/11/13 1936  . Marland KitchenTPN (CLINIMIX-E) Adult 70 mL/hr at 11/12/13 1718   And  . 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  On 3/8 underwent emergency exploratory laparotomy, drainage of intra-abdominal abscesses, mobilization of splenic flexure, sigmoid colectomy, colostomy.   Bowel function has not yet returned and baseline prealbumin was 6.7.  TNA initiated 3/12 with pharmacy assistance requested.  Nutritional Goals: (per RD assessment 3/12) 80-100 grams of protein per day, 1600 - 1750 kCal/day  Goal TNA Formula and Regimen: Clinimix-E 5/15 at 70 mL/hr + Fat Emulsion 20% at 10 mL/hr to deliver 84 grams protein/day, 1672 kCal/day  3/16: TNA Access:  PICC On TNA Day #7 (3/12 - present)  Patient tolerating goal rate of TNA.  NG tubed clamped on 3/16 AM.  Plan for today is to pull the NG tube,  stop the PCA and start CLD.  Will see how patient tolerates, continue TNA for now.    Labs from 3/16 Glucose/CBGs: at goal < 150, controlled with minimal SSI so far.  No hx of DM. Electrolytes: Na 134, Cl 95, other lytes WNL Renal:   SCr below LLN. BUN WNL. Hepatic:  Alk Phos up slightly to 162, but still < 2x ULN.  Other LFTs < ULN. Triglycerides: 102 (3/13), 130 (3/16) Prealbumin:  6.7 (3/11), 7.2 (3/13), 13.8 (3/16)  Plan:  1. At 1800 tonight:      -Continue Clinimix-E 5/15 at goal rate of 70 mL/hr      -Continue Fat Emulsion 20% at 10 mL/hr.      -Continue Multivitamins in TNA daily.        -Continue Trace Elements MWF only due to national shortage. 2. Continue mIVF at [facility]. 3. Continue CBGs q6h, cover with sensitive-scale SSI. 4. Full TNA labs every Monday and Thursday. 5. F/u  daily.  Makenlee Mckeag, Gaye Alken PharmD  Pager #: [phone] 9:59 AM 11/13/2013

## 2013-11-13 NOTE — Consult Note (Signed)
WOC for visit with patient for pouch change with patient and her husband at 1:00pm, however when I arrived the patient is off the unit for her radiation treatment with her husband.  I will attempt joint visit again tom.  WOC will follow along with you for ostomy care. Clotilde Loth Dighton RN,CWOCN [phone]

## 2013-11-13 NOTE — Progress Notes (Signed)
10 Days Post-Op  Subjective: Doing better, OK with NG clamped some gas in ostomy, starting to get some more brown liquid into ostomy bag.  Objective: Vital signs in last 24 hours: Temp:  [97.8 F (36.6 C)-98.4 F (36.9 C)] 97.8 F (36.6 C) (03/18 0540) Pulse Rate:  [78-81] 79 (03/18 0540) Resp:  [11-21] 20 (03/18 0800) BP: (97-98)/(60-70) 98/70 mmHg (03/18 0540) SpO2:  [99 %-100 %] 99 % (03/18 0800) Last BM Date: 11/07/13 Nothing PO, nothing per NG and nothing per ostomy recorded. Afebrile, VSS WBC up but improving Ice chips only Intake/Output from previous day: 03/17 0701 - 03/18 0700 In: 320 [I.V.:320] Out: 1962 [Urine:1950; Drains:12] Intake/Output this shift:    General appearance: alert, cooperative and no distress GI: soft, with some new gas and fluid in the ostomy.  wound OK  Lab Results:   Recent Labs  11/11/13 0330 11/13/13 0530  WBC 19.2* 16.6*  HGB 8.6* 8.2*  HCT 26.1* 25.7*  PLT 463* 597*    BMET  Recent Labs  11/11/13 0330  NA 134*  K 3.9  CL 95*  CO2 30  GLUCOSE 134*  BUN 13  CREATININE 0.41*  CALCIUM 8.5   PT/INR No results found for this basename: LABPROT, INR,  in the last 72 hours   Recent Labs Lab 11/08/13 0417 11/11/13 0330  AST 12 11  ALT 14 14  ALKPHOS 83 162*  BILITOT 0.5 0.3  PROT 5.2* 5.8*  ALBUMIN 1.6* 1.7*     Lipase     Component Value Date/Time   LIPASE 8* 11/03/2013 0304     Studies/Results: No results found.  Medications: . heparin subcutaneous  5,000 Units Subcutaneous 3 times per day  . insulin aspart  0-9 Units Subcutaneous 4 times per day  . morphine   Intravenous 6 times per day  . pantoprazole (PROTONIX) IV  40 mg Intravenous Q24H  . piperacillin-tazobactam (ZOSYN)  IV  3.375 g Intravenous Q8H  . vancomycin  1,500 mg Intravenous Q12H    Assessment/Plan 1. Perforated sigmoid colon with multiple abscesses  Emergent exploratory laparotomy, drainage of intra-abdominal abscesses, mobilization of  splenic flexure, sigmoid colectomy, colostomy---Dr. Zella Richer 11/03/13  2. Hx metastatic lung cancer with thoracic radiotherapy--Dr. [name] (treatment #7/25 yesterday)  3. COPD/Tobacco use hx  4. Hypertension  5. Anemia, this is new  5. Deconditioning and Malnutrition (prealbumin 6.7 and started on TNA 11/07/13)  7. Post op ileus  8. Leukocytosis/possible Healthcare associated pneumonia (10days of Zosyn 5 days of vancomycin.)  Plan:  Dr. Zella Richer has seen.  He plans to pull NG, stop the PCA  And start liquids.   LOS: 10 days    Alexis [name] 11/13/2013

## 2013-11-13 NOTE — Progress Notes (Signed)
Pt ambulated round unit x 1. Tolerated ambulation.  IS used up to  1750 mark x 3. Up in chair after ambulation. Vwilliams,rn.

## 2013-11-13 NOTE — Consult Note (Signed)
[facility] PROGRESS NOTE  Alexis [name] QTM:[id number] DOB: 12/17/1950 DOA: 11/03/2013 PCP: [name], MD  Assessment/Plan: Active Problems:  Pneumoperitoneum  Perforation of sigmoid colon  Leukocytosis, unspecified  HCAP (healthcare-associated pneumonia)    63 y.o. 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 She presented to the hospital with abdominal distention and pain and was found to have pneumoperitoneum. She underwent emergent surgery on March 8 and was found to have perforated sigmoid colon with multiple intra-abdominal abscess ease. She underwent drainage of these abscesses, sigmoid colectomy and colostomy. She's currently on TPN. She is getting radiation treatments on a daily basis for her lung cancer and brain mets. It was noted by the surgical staff that her WBC was increasing. She underwent a CT scan of her chest, Abdomen, pelvis and was noted to have small pleural effusion and possibility of an infiltrate in the right lower lung  1. Leukocytosis/HCAP  -Recent CT scan suggested pneumonia. She is however, asymptomatic. She doesn't have any obvious rash. The urinalysis was unremarkable. The intra-abdominal abscesses appeared to have decreased in size. No cultures available from theses abscesses. C. Difficile PCR is negative. Blood cultures are negative as well. Treat possible pneumonia as below. Leukocytosis could also be due to underlying state of inflammation from her abdominal issues. No other source of infection is identified. Might take more than just a few days to resolve.  2. Possible Healthcare Associated Pneumonia  She was already on Zosyn. Vancomycin was added 3/13. Blood cultures obtained and negative so far. Incentive spirometry. Oxygen as needed. Stable from respiratory standpoint.  3. Metastatic Lung Cancer  Management as per oncology and radiation oncologist. She is  getting radiation treatments.  4. Perforation of sigmoid colon status post sigmoid colectomy with several intra-abdominal abscesses  Management per surgery.  5. Anemia  Likely from recent acute illness. Per RN some blood was noted through colostomy a few days ago. None recently. Hgb appears to stable. Continue to monitor.   [facility] will follow daily. Please call if there are any questions.   Code Status: full Family Communication: d/w aptient (indicate person spoken with, relationship, and if by phone, the number) Disposition Plan: per primary  Procedures: Exploratory lap, sigmoid colectomy, abscess drainage 3/8  Antibiotics:  Zosyn 3/8-->  Vanc 3/13-->   HPI/Subjective: alert  Objective: Filed Vitals:   11/13/13 0800  BP:   Pulse:   Temp:   Resp: 20    Intake/Output Summary (Last 24 hours) at 11/13/13 1315 Last data filed at 11/13/13 1000  Gross per 24 hour  Intake    440 ml  Output   2312 ml  Net  -1872 ml   Filed Weights   11/03/13 0021 11/04/13 0400 11/05/13 0400  Weight: 52.164 kg (115 lb) 54.3 kg (119 lb 11.4 oz) 55.2 kg (121 lb 11.1 oz)    Exam:   General:  alert  Cardiovascular: s1,s2 rrr  Respiratory: few rales   Abdomen: soft, colostomy patent   Musculoskeletal: no LE edema   Data Reviewed: Basic Metabolic Panel:  Recent Labs Lab 11/07/13 0455 11/08/13 0417 11/09/13 0422 11/10/13 0546 11/11/13 0330  NA 139 139 137 136* 134*  K 3.9 3.7 4.0 4.1 3.9  CL 98 96 99 98 95*  CO2 34* 33* 30 30 30   GLUCOSE 97 130* 145* 150* 134*  BUN 6 6 8 11 13   CREATININE 0.45* 0.39* 0.40* 0.38* 0.41*  CALCIUM 8.2* 8.3*  8.3* 8.4 8.5  MG  --  1.5 1.8 1.9 1.9  PHOS  --  4.1 3.2 3.6 4.0   Liver Function Tests:  Recent Labs Lab 11/08/13 0417 11/11/13 0330  AST 12 11  ALT 14 14  ALKPHOS 83 162*  BILITOT 0.5 0.3  PROT 5.2* 5.8*  ALBUMIN 1.6* 1.7*   No results found for this basename: LIPASE, AMYLASE,  in the last 168 hours No results found for this  basename: AMMONIA,  in the last 168 hours CBC:  Recent Labs Lab 11/08/13 0417 11/08/13 1743 11/09/13 0422 11/10/13 0546 11/11/13 0330 11/13/13 0530  WBC 19.2* 22.3* 20.3* 17.8* 19.2* 16.6*  NEUTROABS 16.8*  --   --   --  15.8* 13.2*  HGB 9.0* 8.9* 8.7* 8.4* 8.6* 8.2*  HCT 28.0* 26.5* 26.7* 25.5* 26.1* 25.7*  MCV 94.9 92.7 95.0 94.4 96.0 96.6  PLT 393 384 382 431* 463* 597*   Cardiac Enzymes: No results found for this basename: CKTOTAL, CKMB, CKMBINDEX, TROPONINI,  in the last 168 hours BNP (last 3 results) No results found for this basename: PROBNP,  in the last 8760 hours CBG:  Recent Labs Lab 11/12/13 1227 11/12/13 1822 11/13/13 0009 11/13/13 0635 11/13/13 1223  GLUCAP 130* 141* 129* 134* 127*    Recent Results (from the past 240 hour(s))  URINE CULTURE     Status: None   Collection Time    11/08/13  1:59 PM      Result Value Ref Range Status   Specimen Description URINE, CLEAN CATCH   Final   Special Requests NONE   Final   Culture  Setup Time     Final   Value: 11/09/2013 02:39     Performed at [facility] Count     Final   Value: NO GROWTH     Performed at [facility]   Culture     Final   Value: NO GROWTH     Performed at [facility]   Report Status 11/10/2013 FINAL   Final  CULTURE, BLOOD (ROUTINE X 2)     Status: None   Collection Time    11/08/13  5:00 PM      Result Value Ref Range Status   Specimen Description BLOOD LEFT HAND   Final   Special Requests BOTTLES DRAWN AEROBIC AND ANAEROBIC 10CC   Final   Culture  Setup Time     Final   Value: 11/08/2013 22:07     Performed at [facility]   Culture     Final   Value:        BLOOD CULTURE RECEIVED NO GROWTH TO DATE CULTURE WILL BE HELD FOR 5 DAYS BEFORE ISSUING A FINAL NEGATIVE REPORT     Performed at [facility]   Report Status PENDING   Incomplete  CULTURE, BLOOD (ROUTINE X 2)     Status: None   Collection Time    11/08/13  5:15 PM       Result Value Ref Range Status   Specimen Description BLOOD LEFT ARM   Final   Special Requests BOTTLES DRAWN AEROBIC AND ANAEROBIC 10CC   Final   Culture  Setup Time     Final   Value: 11/08/2013 22:07     Performed at [facility]   Culture     Final   Value:        BLOOD CULTURE RECEIVED NO GROWTH TO DATE CULTURE WILL  BE HELD FOR 5 DAYS BEFORE ISSUING A FINAL NEGATIVE REPORT     Performed at [facility]   Report Status PENDING   Incomplete  CLOSTRIDIUM DIFFICILE BY PCR     Status: None   Collection Time    11/09/13  2:23 AM      Result Value Ref Range Status   C difficile by pcr NEGATIVE  NEGATIVE Final   Comment: Performed at [facility]     Studies: No results found.  Scheduled Meds: . heparin subcutaneous  5,000 Units Subcutaneous 3 times per day  . insulin aspart  0-9 Units Subcutaneous 4 times per day  . pantoprazole (PROTONIX) IV  40 mg Intravenous Q24H  . piperacillin-tazobactam (ZOSYN)  IV  3.375 g Intravenous Q8H  . vancomycin  1,500 mg Intravenous Q12H   Continuous Infusions: . dextrose 5% lactated ringers with KCl 20 mEq/L 20 mL/hr at 11/11/13 1936  . Marland KitchenTPN (CLINIMIX-E) Adult 70 mL/hr at 11/12/13 1718   And  . fat emulsion 250 mL (11/12/13 1718)  . Marland KitchenTPN (CLINIMIX-E) Adult     And  . fat emulsion      Active Problems:   Pneumoperitoneum   Perforation of sigmoid colon   Leukocytosis, unspecified   HCAP (healthcare-associated pneumonia)    Time spent: >35 minutes     Kinnie Feil  [facility] Pager [phone]. If 7PM-7AM, please contact night-coverage at [email or web address], password [email or web address] 11/13/2013, 1:15 PM  LOS: 10 days

## 2013-11-14 ENCOUNTER — Ambulatory Visit: Payer: BC Managed Care – PPO

## 2013-11-14 ENCOUNTER — Ambulatory Visit
Admit: 2013-11-14 | Discharge: 2013-11-14 | Disposition: A | Discharge status: Home / Self Care | Payer: BC Managed Care – PPO | Attending: Radiation Oncology | Admitting: Radiation Oncology

## 2013-11-14 LAB — CULTURE, BLOOD (ROUTINE X 2)
Culture: NO GROWTH
Culture: NO GROWTH

## 2013-11-14 LAB — COMPREHENSIVE METABOLIC PANEL
ALT: 21 U/L (ref 0–35)
AST: 20 U/L (ref 0–37)
Albumin: 1.7 g/dL — ABNORMAL LOW (ref 3.5–5.2)
Alkaline Phosphatase: 189 U/L — ABNORMAL HIGH (ref 39–117)
BUN: 11 mg/dL (ref 6–23)
CO2: 29 mEq/L (ref 19–32)
Calcium: 8.5 mg/dL (ref 8.4–10.5)
Chloride: 100 mEq/L (ref 96–112)
Creatinine, Ser: 0.42 mg/dL — ABNORMAL LOW (ref 0.50–1.10)
GFR calc Af Amer: >90 mL/min (ref >90)
GFR calc non Af Amer: >90 mL/min (ref >90)
Glucose, Bld: 162 mg/dL — ABNORMAL HIGH (ref 70–99)
Potassium: 3.8 mEq/L (ref 3.7–5.3)
Sodium: 138 mEq/L (ref 137–147)
Total Bilirubin: 0.4 mg/dL (ref 0.3–1.2)
Total Protein: 5.8 g/dL — ABNORMAL LOW (ref 6.0–8.3)

## 2013-11-14 LAB — CBC
HCT: 24.4 % — ABNORMAL LOW (ref 36.0–46.0)
Hemoglobin: 7.8 g/dL — ABNORMAL LOW (ref 12.0–15.0)
MCH: 30.7 pg (ref 26.0–34.0)
MCHC: 32 g/dL (ref 30.0–36.0)
MCV: 96.1 fL (ref 78.0–100.0)
Platelets: 661 10*3/uL — ABNORMAL HIGH (ref 150–400)
RBC: 2.54 MIL/uL — ABNORMAL LOW (ref 3.87–5.11)
RDW: 16.7 % — ABNORMAL HIGH (ref 11.5–15.5)
WBC: 15.2 10*3/uL — ABNORMAL HIGH (ref 4.0–10.5)

## 2013-11-14 LAB — GLUCOSE, CAPILLARY
Glucose-Capillary: 127 mg/dL — ABNORMAL HIGH (ref 70–99)
Glucose-Capillary: 150 mg/dL — ABNORMAL HIGH (ref 70–99)
Glucose-Capillary: 160 mg/dL — ABNORMAL HIGH (ref 70–99)
Glucose-Capillary: 167 mg/dL — ABNORMAL HIGH (ref 70–99)
Glucose-Capillary: 175 mg/dL — ABNORMAL HIGH (ref 70–99)

## 2013-11-14 LAB — MAGNESIUM: Magnesium: 1.8 mg/dL (ref 1.5–2.5)

## 2013-11-14 LAB — PHOSPHORUS: Phosphorus: 3.1 mg/dL (ref 2.3–4.6)

## 2013-11-14 MED ORDER — ONDANSETRON HCL 4 MG PO TABS: 8.0000 mg | ORAL_TABLET | Freq: Three times a day (TID) | ORAL | Status: DC | PRN

## 2013-11-14 MED ORDER — M.V.I. ADULT IV INJ
INTRAVENOUS | Status: AC
  Administered 2013-11-14: 18:00:00 via INTRAVENOUS
  Filled 2013-11-14: qty 2000

## 2013-11-14 MED ORDER — ENSURE COMPLETE PO LIQD
237.0000 mL | Freq: Two times a day (BID) | ORAL | Status: DC
  Administered 2013-11-14 – 2013-11-18 (×7): 237 mL via ORAL

## 2013-11-14 MED ORDER — OXYCODONE-ACETAMINOPHEN 5-325 MG PO TABS
1.0000 | ORAL_TABLET | ORAL | Status: DC | PRN
  Administered 2013-11-14 – 2013-11-15 (×4): 2 via ORAL
  Filled 2013-11-14 (×5): qty 2

## 2013-11-14 MED ORDER — ALPRAZOLAM 0.5 MG PO TABS: 0.5000 mg | ORAL_TABLET | Freq: Four times a day (QID) | ORAL | Status: DC | PRN

## 2013-11-14 MED ORDER — INSULIN ASPART 100 UNIT/ML ~~LOC~~ SOLN: 0.0000 [IU] | Freq: Every day | SUBCUTANEOUS | Status: DC

## 2013-11-14 MED ORDER — FAT EMULSION 20 % IV EMUL
250.0000 mL | INTRAVENOUS | Status: AC
  Administered 2013-11-14: 250 mL via INTRAVENOUS
  Filled 2013-11-14: qty 250

## 2013-11-14 MED ORDER — INSULIN ASPART 100 UNIT/ML ~~LOC~~ SOLN
0.0000 [IU] | Freq: Three times a day (TID) | SUBCUTANEOUS | Status: DC
  Administered 2013-11-14: 1 [IU] via SUBCUTANEOUS
  Administered 2013-11-14 – 2013-11-15 (×3): 2 [IU] via SUBCUTANEOUS
  Administered 2013-11-15 – 2013-11-16 (×2): 1 [IU] via SUBCUTANEOUS

## 2013-11-14 MED ORDER — IBUPROFEN 600 MG PO TABS
600.0000 mg | ORAL_TABLET | Freq: Four times a day (QID) | ORAL | Status: DC | PRN
  Administered 2013-11-15: 600 mg via ORAL
  Filled 2013-11-14: qty 1

## 2013-11-14 MED ORDER — PANTOPRAZOLE SODIUM 40 MG PO TBEC
40.0000 mg | DELAYED_RELEASE_TABLET | Freq: Every day | ORAL | Status: DC
  Administered 2013-11-14 – 2013-11-18 (×5): 40 mg via ORAL
  Filled 2013-11-14 (×5): qty 1

## 2013-11-14 NOTE — Progress Notes (Signed)
11 Days Post-Op  Subjective: She is feeling bad right now.  She is having some pain.  She is distressed because she can smell stool.  She looks and appears stronger.  Objective: Vital signs in last 24 hours: Temp:  [98 F (36.7 C)-98.5 F (36.9 C)] 98.4 F (36.9 C) (03/19 0547) Pulse Rate:  [76-80] 80 (03/19 0547) Resp:  [18-20] 18 (03/19 0547) BP: (94-121)/(53-75) 105/61 mmHg (03/19 0547) SpO2:  [93 %-99 %] 97 % (03/19 0547) Last BM Date: 11/13/13 800 PO clear diet 725 from ostomy recorded. Drain: 40 ml Afebrile, VSS Labs OK, WBC slowly improving  Hbg down to 7.8  Intake/Output from previous day: 03/18 0701 - 03/19 0700 In: 3210 [P.O.:800; I.V.:620; IV Piggyback:1150; TPN:640] Out: 5993 [Urine:3800; Drains:40; Stool:725] Intake/Output this shift:    General appearance: alert, cooperative and no distress.  She is very anxious about odor. GI: soft, good stool output from the ostomy now.  It needs to be resealed..  Wound is still soupy at the base. We need to clean that with each dressing change.  +BS  Lab Results:   Recent Labs  11/13/13 0530 11/14/13 0515  WBC 16.6* 15.2*  HGB 8.2* 7.8*  HCT 25.7* 24.4*  PLT 597* 661*    BMET  Recent Labs  11/14/13 0515  NA 138  K 3.8  CL 100  CO2 29  GLUCOSE 162*  BUN 11  CREATININE 0.42*  CALCIUM 8.5   PT/INR No results found for this basename: LABPROT, INR,  in the last 72 hours   Recent Labs Lab 11/08/13 0417 11/11/13 0330 11/14/13 0515  AST 12 11 20   ALT 14 14 21   ALKPHOS 83 162* 189*  BILITOT 0.5 0.3 0.4  PROT 5.2* 5.8* 5.8*  ALBUMIN 1.6* 1.7* 1.7*     Lipase     Component Value Date/Time   LIPASE 8* 11/03/2013 0304     Studies/Results: No results found.  Medications: . heparin subcutaneous  5,000 Units Subcutaneous 3 times per day  . insulin aspart  0-9 Units Subcutaneous 4 times per day  . pantoprazole (PROTONIX) IV  40 mg Intravenous Q24H  . piperacillin-tazobactam (ZOSYN)  IV  3.375 g  Intravenous Q8H  . vancomycin  1,500 mg Intravenous Q12H    Assessment/Plan 1. Perforated sigmoid colon with multiple abscesses  Emergent exploratory laparotomy, drainage of intra-abdominal abscesses, mobilization of splenic flexure, sigmoid colectomy, colostomy---Dr. Zella Richer 11/03/13  2. Hx metastatic lung cancer with thoracic radiotherapy--Dr. [name] (treatment #7/25 yesterday)  3. COPD/Tobacco use hx  4. Hypertension  5. Anemia, this is new  5. Deconditioning and Malnutrition (prealbumin 6.7 and started on TNA 11/07/13)  7. Post op ileus  8. Leukocytosis/possible Healthcare associated pneumonia (11 days of Zosyn 6 days of vancomycin.)   Plan:  Advance diet, continue wound care.  Discuss length of antibiotics.  If she does well start weaning TNA tomorrow.  He HBG is down to 7.8, with ongoing radiation Rx we will discuss when we want to consider transfusion.  Also discuss when to pull drain.  Ask nursing to work on the base of the wound.  LOS: 11 days    Alexis [name] 11/14/2013

## 2013-11-14 NOTE — Progress Notes (Signed)
NUTRITION FOLLOW UP/CONSULT FOR TPN  Intervention:   - TPN per pharmacist - Diet advancement per MD - Ensure Complete TID - Will continue to monitor   Nutrition Dx:   Inadequate oral intake related to inability to eat as evidenced by NPO - ongoing but now related to poor appetite as evidenced by <25% meal intake   Goal:   TPN to meet >90% of estimated nutritional needs - met  New goal: Pt to consume >50% of meals/supplements   Monitor:   Weights, labs, diet advancement, TPN  Assessment:   Pt is a 63 year old female who developed abdominal pain with nausea vomiting and a small amount of diarrhea 3 days ago. Her husband had similar symptoms. However her pain worsened and persisted. She got progressive abdominal distention. She presented to the emergency department for evaluation. She underwent a CT scan of the abdomen which demonstrated pneumoperitoneum as well as thickening in the sigmoid colon area suggesting perforated sigmoid colon. There is also an impending bowel obstruction with dilated loops of bowel. There multiple interloop abscesses. 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 She's been treated with radiation therapy at this time. PET scan did not demonstrate any intra-abdominal metastasis. Underwent emergency exploratory laparotomy, drainage of intra-abdominal abscesses, mobilization of splenic flexure, sigmoid colectomy, colostomy yesterday. Has NGT in place, 240m output total yesterday.   3/9 - Met with pt and husband who report pt with minimal PO intake since Thursday due to nausea, vomiting, and diarrhea. Was only eating 1 meal/day and a snack, an example was chicken noodle soup. Before then she was eating excellent, several small meals throughout the day and drinking 2 Glucerna shakes/day. Husband attributes this to the steroids she was on. Pt reports she has been gaining weight due to eating well, noted pt's weight up 8 pounds in the past month.    3/12 - Nothing from ostomy today, "waiting for GI tract to work" per PA notes. NGT was not advanced far enough, plans to advance today to help fix low NGT output. Plan is to place PICC and start TPN. Discussed with pharmacist and pt. Met with pt who reports c/o ongoing nausea and stomach pain.  Weight up 6 pounds since admission.   3/19 - NGT clamped 3/16, d/c 3/18. Diet advanced to full liquids today, if pt does well, may start to wean TPN per PA notes. Pt asleep during visit, per conversation with RN, pt has been tolerating full liquid diet well with no nausea/vomiting. Had a few bites of chocolate pudding and grits and Sprite.   TPN: Clinimix-E 5/15 at 70 mL/hr + Fat Emulsion 20% at 10 mL/hr to deliver 84 grams protein/day, 1672 kCal/day which will meet 100% of estimated nutritional needs  PALB improving CBGs elevated, pharmacist changed sensitive SSI to [facility] bed[facility] scale at HS Alk phos elevated and trending up  Height: Ht Readings from Last 1 Encounters:  11/03/13 _0  (1.626 m)    Weight Status:   Wt Readings from Last 1 Encounters:  11/05/13 121 lb 11.1 oz (55.2 kg)    Re-estimated needs:  Kcal: 1600-1750  Protein: 80-100g  Fluid: 1.6-1.7L/day   Skin: Abdomen incision   Diet Order: Full Liquid   Intake/Output Summary (Last 24 hours) at 11/14/13 1503 Last data filed at 11/14/13 1400  Gross per 24 hour  Intake   2540 ml  Output   5035 ml  Net  -2495 ml    Last BM: 3/19  Labs:   Recent Labs Lab 11/10/13 0546 11/11/13 0330 11/14/13 0515  NA 136* 134* 138  K 4.1 3.9 3.8  CL 98 95* 100  CO2 _0 BUN _1 CREATININE 0.38* 0.41* 0.42*  CALCIUM 8.4 8.5 8.5  MG 1.9 1.9 1.8  PHOS 3.6 4.0 3.1  GLUCOSE 150* 134* 162*    CBG (last 3)   Recent Labs  11/13/13 2347 11/14/13 0637 11/14/13 1159  GLUCAP 132* 160* 175*    Scheduled Meds: . heparin subcutaneous  5,000 Units Subcutaneous 3 times per day  . insulin aspart  0-5 Units  Subcutaneous QHS  . insulin aspart  0-9 Units Subcutaneous TID WC  . pantoprazole  40 mg Oral Daily  . piperacillin-tazobactam (ZOSYN)  IV  3.375 g Intravenous Q8H  . vancomycin  1,500 mg Intravenous Q12H    Continuous Infusions: . dextrose 5% lactated ringers with KCl 20 mEq/L 20 mL/hr at 11/13/13 1944  . Marland KitchenTPN (CLINIMIX-E) Adult 70 mL/hr at 11/13/13 1800   And  . fat emulsion 250 mL (11/13/13 1800)  . Marland KitchenTPN (CLINIMIX-E) Adult     And  . fat emulsion       Mikey College MS, RD, LDN [phone] Pager [phone] After Hours Pager

## 2013-11-14 NOTE — Care Management Note (Signed)
    Page 1 of 2   11/14/2013     4:07:24 PM   CARE MANAGEMENT NOTE 11/14/2013  Patient:  [name],[name]   Account Number:  [id number]  Date Initiated:  11/05/2013  Documentation initiated by:  [facility]  Subjective/Objective Assessment:   perforated bowel requiring surgical intervention, ongoing treatment for lung and brain ca.     Action/Plan:   from home   Anticipated DC Date:  11/18/2013   Anticipated DC Plan:  [place] referral  NA      DC Planning Services  CM consult      [facility] Choice  HOME HEALTH   Choice offered to / List presented to:  C-1 Patient   DME arranged  NA      DME agency  NA     [place] arranged  HH-1 RN      [place]   Status of service:  Completed, signed off Medicare Important Message given?  NA - LOS <3 / Initial given by admissions (If response is "NO", the following Medicare IM given date fields will be blank) Date Medicare IM given:   Date Additional Medicare IM given:    Discharge Disposition:  [place]  Per UR Regulation:  Reviewed for med. necessity/level of care/duration of stay  If discussed at [place] of Stay Meetings, dates discussed:    Comments:  11-14-13 [place] 1200 Spoke with patient and husband at bedside. Agreeable to [facility] services with [facility]. Contacted rep to arrange, awaiting final orders.  [place], RN, BSN, CCM [phone] Chart Reviewed for discharge and hospital needs. Discharge needs at time of review:  None present will follow for needs. Review of patient progress due on [id number]. Scheduled to be transferred to med/surg floor [id number].

## 2013-11-14 NOTE — Plan of Care (Signed)
Problem: Phase III Progression Outcomes Goal: Nasogastric tube discontinued Outcome: Completed/Met Date Met:  11/14/13 Discontinued earlier this week.

## 2013-11-14 NOTE — Consult Note (Signed)
WOC ostomy follow up Stoma type/location:  LLQ, end colostomy Stomal assessment/size: 1 3/8" flush, with sloughing of the mucosa.  Peristomal assessment: intact Treatment options for stomal/peristomal skin: 2" barrier ring used to assist with seal of flush stoma Output pasty, liquid brown Ostomy pouching: 1pc with 2" barrier ring Education provided: worked with patient and her husband today to have them change the pouch. Pt independent with lock and roll closure, encouraging her to start to empty now that she has output.  Placed pouch in a downward position to also facilitate her emptying the pouch.  Explained that stoma may change and slough and to monitor for this.    Enrolled patient in [place] Start Discharge program: Yes, samples arrived at patient home already.  I will send additional beige pouches for this patient.   Teaching sheets provided for the husband.  He seems very eager to help his wife.  Would suggest [facility] for continued support with ostomy care and wound care.   Dr. Zella Richer has also suggested [facility] nurse as a Data processing manager.  [facility] team will continue to follow with you for ostomy teaching  Larah Kuntzman [facility] RN,CWOCN [phone]

## 2013-11-14 NOTE — Progress Notes (Signed)
[facility] Therapy Treatment Record Phone [phone]   Radiation Therapy was administered to Alexis [name] on: 11/14/2013  2:06 PM and was treatment # 10 out of a planned course of 25 treatments.

## 2013-11-14 NOTE — Progress Notes (Signed)
Bowel function returning.  Still with a slowly resolving leukocytosis so need to continue abxs.

## 2013-11-14 NOTE — Consult Note (Signed)
[facility] PROGRESS NOTE  JAYLEE FREEZE QJJ:[id number] DOB: [birth year] DOA: 11/03/2013 PCP: [name], MD  Assessment/Plan: Active Problems:  Pneumoperitoneum  Perforation of sigmoid colon  Leukocytosis, unspecified  HCAP (healthcare-associated pneumonia)    63 y.o. 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 She presented to the hospital with abdominal distention and pain and was found to have pneumoperitoneum. She underwent emergent surgery on March 8 and was found to have perforated sigmoid colon with multiple intra-abdominal abscess ease. She underwent drainage of these abscesses, sigmoid colectomy and colostomy. She's currently on TPN. She is getting radiation treatments on a daily basis for her lung cancer and brain mets. It was noted by the surgical staff that her WBC was increasing. She underwent a CT scan of her chest, Abdomen, pelvis and was noted to have small pleural effusion and possibility of an infiltrate in the right lower lung  1. Leukocytosis/HCAP  -Recent CT scan suggested pneumonia. She is however, asymptomatic. She doesn't have any obvious rash. The urinalysis was unremarkable. The intra-abdominal abscesses appeared to have decreased in size. No cultures available from theses abscesses. C. Difficile PCR is negative. Blood cultures are negative as well. Treat possible pneumonia as below. Leukocytosis could also be due to underlying state of inflammation from her abdominal issues. No other source of infection is identified. Might take more than just a few days to resolve.  2. Possible Healthcare Associated Pneumonia  She was already on Zosyn. Vancomycin was added 3/13. Blood cultures obtained and negative so far. Incentive spirometry. Oxygen as needed. Stable from respiratory standpoint.  -afebrile; blood cultures neg; may change to PO atx levofloxacin for 4 days ;  3.  Metastatic Lung Cancer  Management as per oncology and radiation oncologist. She is getting radiation treatments.  4. Perforation of sigmoid colon status post sigmoid colectomy with several intra-abdominal abscesses  Management per surgery; on  TPN.  5. Anemia  Likely from recent acute illness. Per RN some blood was noted through colostomy a few days ago. None recently. continue to monitor.   TR[facility] will follow daily. Please call if there are any questions.   Code Status: full Family Communication: d/w aptient (indicate person spoken with, relationship, and if by phone, the number) Disposition Plan: per primary  Procedures: Exploratory lap, sigmoid colectomy, abscess drainage 3/8  Antibiotics:  Zosyn 3/8-->  Vanc 3/13-->   HPI/Subjective: alert  Objective: Filed Vitals:   11/14/13 0547  BP: 105/61  Pulse: 80  Temp: 98.4 F (36.9 C)  Resp: 18    Intake/Output Summary (Last 24 hours) at 11/14/13 1130 Last data filed at 11/14/13 1000  Gross per 24 hour  Intake   2540 ml  Output   5060 ml  Net  -2520 ml   Filed Weights   11/03/13 0021 11/04/13 0400 11/05/13 0400  Weight: 52.164 kg (115 lb) 54.3 kg (119 lb 11.4 oz) 55.2 kg (121 lb 11.1 oz)    Exam:   General:  alert  Cardiovascular: s1,s2 rrr  Respiratory: few rales   Abdomen: soft, colostomy patent   Musculoskeletal: no LE edema   Data Reviewed: Basic Metabolic Panel:  Recent Labs Lab 11/08/13 0417 11/09/13 0422 11/10/13 0546 11/11/13 0330 11/14/13 0515  NA 139 137 136* 134* 138  K 3.7 4.0 4.1 3.9 3.8  CL 96 99 98 95* 100  CO2 33* 30 30 30 29   GLUCOSE 130* 145* 150* 134* 162*  BUN  6 8 11 13 11   CREATININE 0.39* 0.40* 0.38* 0.41* 0.42*  CALCIUM 8.3* 8.3* 8.4 8.5 8.5  MG 1.5 1.8 1.9 1.9 1.8  PHOS 4.1 3.2 3.6 4.0 3.1   Liver Function Tests:  Recent Labs Lab 11/08/13 0417 11/11/13 0330 11/14/13 0515  AST 12 11 20   ALT 14 14 21   ALKPHOS 83 162* 189*  BILITOT 0.5 0.3 0.4  PROT 5.2* 5.8*  5.8*  ALBUMIN 1.6* 1.7* 1.7*   No results found for this basename: LIPASE, AMYLASE,  in the last 168 hours No results found for this basename: AMMONIA,  in the last 168 hours CBC:  Recent Labs Lab 11/08/13 0417  11/09/13 0422 11/10/13 0546 11/11/13 0330 11/13/13 0530 11/14/13 0515  WBC 19.2*  < > 20.3* 17.8* 19.2* 16.6* 15.2*  NEUTROABS 16.8*  --   --   --  15.8* 13.2*  --   HGB 9.0*  < > 8.7* 8.4* 8.6* 8.2* 7.8*  HCT 28.0*  < > 26.7* 25.5* 26.1* 25.7* 24.4*  MCV 94.9  < > 95.0 94.4 96.0 96.6 96.1  PLT 393  < > 382 431* 463* 597* 661*  < > = values in this interval not displayed. Cardiac Enzymes: No results found for this basename: CKTOTAL, CKMB, CKMBINDEX, TROPONINI,  in the last 168 hours BNP (last 3 results) No results found for this basename: PROBNP,  in the last 8760 hours CBG:  Recent Labs Lab 11/13/13 0635 11/13/13 1223 11/13/13 1757 11/13/13 2347 11/14/13 0637  GLUCAP 134* 127* 146* 132* 160*    Recent Results (from the past 240 hour(s))  URINE CULTURE     Status: None   Collection Time    11/08/13  1:59 PM      Result Value Ref Range Status   Specimen Description URINE, CLEAN CATCH   Final   Special Requests NONE   Final   Culture  Setup Time     Final   Value: 11/09/2013 02:39     Performed at [facility]     Final   Value: NO GROWTH     Performed at [facility]   Culture     Final   Value: NO GROWTH     Performed at [facility]   Report Status 11/10/2013 FINAL   Final  CULTURE, BLOOD (ROUTINE X 2)     Status: None   Collection Time    11/08/13  5:00 PM      Result Value Ref Range Status   Specimen Description BLOOD LEFT HAND   Final   Special Requests BOTTLES DRAWN AEROBIC AND ANAEROBIC 10CC   Final   Culture  Setup Time     Final   Value: 11/08/2013 22:07     Performed at [facility]   Culture     Final   Value: NO GROWTH 5 DAYS     Performed at [facility]   Report Status  11/14/2013 FINAL   Final  CULTURE, BLOOD (ROUTINE X 2)     Status: None   Collection Time    11/08/13  5:15 PM      Result Value Ref Range Status   Specimen Description BLOOD LEFT ARM   Final   Special Requests BOTTLES DRAWN AEROBIC AND ANAEROBIC 10CC   Final   Culture  Setup Time     Final   Value: 11/08/2013 22:07     Performed at [facility]  Culture     Final   Value: NO GROWTH 5 DAYS     Performed at [facility]   Report Status 11/14/2013 FINAL   Final  CLOSTRIDIUM DIFFICILE BY PCR     Status: None   Collection Time    11/09/13  2:23 AM      Result Value Ref Range Status   C difficile by pcr NEGATIVE  NEGATIVE Final   Comment: Performed at [facility]     Studies: No results found.  Scheduled Meds: . heparin subcutaneous  5,000 Units Subcutaneous 3 times per day  . insulin aspart  0-5 Units Subcutaneous QHS  . insulin aspart  0-9 Units Subcutaneous TID WC  . pantoprazole  40 mg Oral Daily  . piperacillin-tazobactam (ZOSYN)  IV  3.375 g Intravenous Q8H  . vancomycin  1,500 mg Intravenous Q12H   Continuous Infusions: . dextrose 5% lactated ringers with KCl 20 mEq/L 20 mL/hr at 11/13/13 1944  . Marland KitchenTPN (CLINIMIX-E) Adult 70 mL/hr at 11/13/13 1800   And  . fat emulsion 250 mL (11/13/13 1800)  . Marland KitchenTPN (CLINIMIX-E) Adult     And  . fat emulsion      Active Problems:   Pneumoperitoneum   Perforation of sigmoid colon   Leukocytosis, unspecified   HCAP (healthcare-associated pneumonia)    Time spent: >35 minutes     Kinnie Feil  [facility] Pager [phone]. If 7PM-7AM, please contact night-coverage at [email or web address], password [facility] 11/14/2013, 11:30 AM  LOS: 11 days

## 2013-11-14 NOTE — Progress Notes (Signed)
[facility] NOTE   Pharmacy Consult for TNA Indication: prolonged post-operative ileus  Allergies  Allergen Reactions  . Codeine Nausea And Vomiting    Patient Measurements: Height: 5' 4"  (162.6 cm) Weight: 121 lb 11.1 oz (55.2 kg) IBW/kg (Calculated) : 54.7  Vital Signs: Temp: 98.4 F (36.9 C) (03/19 0547) Temp src: Oral (03/19 0547) BP: 105/61 mmHg (03/19 0547) Pulse Rate: 80 (03/19 0547) Intake/Output from previous day: 03/18 0701 - 03/19 0700 In: 3210 [P.O.:800; I.V.:620; IV Piggyback:1150; TPN:640] Out: 5456 [Urine:3800; Drains:40; Stool:725] Intake/Output from this shift: Total I/O In: -  Out: 500 [Urine:400; Stool:100]  Labs:  Recent Labs  11/13/13 0530 11/14/13 0515  WBC 16.6* 15.2*  HGB 8.2* 7.8*  HCT 25.7* 24.4*  PLT 597* 661*     Recent Labs  11/14/13 0515  NA 138  K 3.8  CL 100  CO2 29  GLUCOSE 162*  BUN 11  CREATININE 0.42*  CALCIUM 8.5  MG 1.8  PHOS 3.1  PROT 5.8*  ALBUMIN 1.7*  AST 20  ALT 21  ALKPHOS 189*  BILITOT 0.4  Corrected Calcium 3/16 = 10.3 Estimated Creatinine Clearance: 63 ml/min (by C-G formula based on Cr of 0.42).    Recent Labs  11/13/13 1757 11/13/13 2347 11/14/13 0637  GLUCAP 146* 132* 160*    Medications:  Scheduled:  . heparin subcutaneous  5,000 Units Subcutaneous 3 times per day  . insulin aspart  0-9 Units Subcutaneous 4 times per day  . pantoprazole (PROTONIX) IV  40 mg Intravenous Q24H  . piperacillin-tazobactam (ZOSYN)  IV  3.375 g Intravenous Q8H  . vancomycin  1,500 mg Intravenous Q12H   Infusions:  . dextrose 5% lactated ringers with KCl 20 mEq/L 20 mL/hr at 11/13/13 1944  . Marland KitchenTPN (CLINIMIX-E) Adult 70 mL/hr at 11/13/13 1800   And  . fat emulsion 250 mL (11/13/13 1800)   PRN: morphine injection, ondansetron (ZOFRAN) IV, ondansetron, phenol, sodium chloride  Insulin Requirements in the past 24 hours:  5 units Novolog SSI (sensitive scale) q6h. CBGs 127-162.  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  On 3/8 underwent emergency exploratory laparotomy, drainage of intra-abdominal abscesses, mobilization of splenic flexure, sigmoid colectomy, colostomy.   Bowel function has not yet returned and baseline prealbumin was 6.7.  TNA initiated 3/12 with pharmacy assistance requested.  3/18 started CL diet.  [facility] Per [facility], cont full TNA today but plan weaning 3/20 if tolerated diet.  Nutritional Goals: (per RD assessment 3/12) 80-100 grams of protein per day, 1600 - 1750 kCal/day  Goal TNA Formula and Regimen: Clinimix-E 5/15 at 70 mL/hr + Fat Emulsion 20% at 10 mL/hr to deliver 84 grams protein/day, 1672 kCal/day  TNA Access:  PICC On TNA Day #8 (3/12 - present)     Glucose/CBGs: mostly at goal < 150, with minimal SSI so far. No hx of DM. Electrolytes: wnl  Renal: SCr below LLN. BUN WNL. Hepatic:  Alk Phos remains slightly elevated, but still < 2x ULN.  Other LFTs wnl. Triglycerides: 102 (3/13), 130 (3/16) Prealbumin:  6.7 (3/11), 7.2 (3/13), 13.8 (3/16)  Plan:   Continue Clinimix-E 5/15 at goal rate of 70 mL/hr  Continue Fat Emulsion  20% at 10 mL/hr.  Continue Multivitamins in TNA daily.    Continue Trace Elements MWF only due to national shortage.  Continue mIVF at [facility].  Change CBGs/sensitive SSI to [facility] and bedtime scale at HS.  Full TNA labs Monday and Thursday.  F/u daily.  Romeo Rabon, PharmD, pager [phone]. 11/14/2013,8:57 AM.

## 2013-11-15 ENCOUNTER — Ambulatory Visit
Admit: 2013-11-15 | Discharge: 2013-11-15 | Disposition: A | Discharge status: Home / Self Care | Payer: BC Managed Care – PPO | Attending: Radiation Oncology | Admitting: Radiation Oncology

## 2013-11-15 ENCOUNTER — Ambulatory Visit: Admission: RE | Admit: 2013-11-15 | Payer: BC Managed Care – PPO | Source: Ambulatory Visit

## 2013-11-15 ENCOUNTER — Telehealth: Payer: Self-pay | Admitting: *Deleted

## 2013-11-15 ENCOUNTER — Encounter: Payer: BC Managed Care – PPO | Admitting: Radiation Oncology

## 2013-11-15 ENCOUNTER — Encounter: Payer: Self-pay | Admitting: *Deleted

## 2013-11-15 DIAGNOSIS — C3410 Malignant neoplasm of upper lobe, unspecified bronchus or lung: Secondary | ICD-10-CM

## 2013-11-15 LAB — VANCOMYCIN, TROUGH: Vancomycin Tr: 22.2 ug/mL — ABNORMAL HIGH (ref 10.0–20.0)

## 2013-11-15 LAB — GLUCOSE, CAPILLARY
Glucose-Capillary: 132 mg/dL — ABNORMAL HIGH (ref 70–99)
Glucose-Capillary: 152 mg/dL — ABNORMAL HIGH (ref 70–99)
Glucose-Capillary: 122 mg/dL — ABNORMAL HIGH (ref 70–99)
Glucose-Capillary: 156 mg/dL — ABNORMAL HIGH (ref 70–99)
Glucose-Capillary: 104 mg/dL — ABNORMAL HIGH (ref 70–99)

## 2013-11-15 LAB — CBC
HCT: 23.3 % — ABNORMAL LOW (ref 36.0–46.0)
Hemoglobin: 7.8 g/dL — ABNORMAL LOW (ref 12.0–15.0)
MCH: 31.8 pg (ref 26.0–34.0)
MCHC: 33.5 g/dL (ref 30.0–36.0)
MCV: 95.1 fL (ref 78.0–100.0)
Platelets: 690 10*3/uL — ABNORMAL HIGH (ref 150–400)
RBC: 2.45 MIL/uL — ABNORMAL LOW (ref 3.87–5.11)
RDW: 16.9 % — ABNORMAL HIGH (ref 11.5–15.5)
WBC: 12.8 10*3/uL — ABNORMAL HIGH (ref 4.0–10.5)

## 2013-11-15 MED ORDER — CLINIMIX E/DEXTROSE (5/15) 5 % IV SOLN
INTRAVENOUS | Status: AC
  Administered 2013-11-15: 17:00:00 via INTRAVENOUS
  Filled 2013-11-15: qty 1000

## 2013-11-15 MED ORDER — OXYCODONE HCL 20 MG/ML PO CONC
5.0000 mg | ORAL | Status: DC | PRN
  Administered 2013-11-15 – 2013-11-18 (×16): 10 mg via ORAL
  Filled 2013-11-15 (×18): qty 1

## 2013-11-15 MED ORDER — FAT EMULSION 20 % IV EMUL
250.0000 mL | INTRAVENOUS | Status: AC
  Administered 2013-11-15: 250 mL via INTRAVENOUS
  Filled 2013-11-15: qty 250

## 2013-11-15 MED ORDER — VANCOMYCIN HCL 10 G IV SOLR
1250.0000 mg | Freq: Two times a day (BID) | INTRAVENOUS | Status: DC
  Administered 2013-11-15 – 2013-11-18 (×6): 1250 mg via INTRAVENOUS
  Filled 2013-11-15 (×7): qty 1250

## 2013-11-15 MED ORDER — ADULT MULTIVITAMIN W/MINERALS CH
1.0000 | ORAL_TABLET | Freq: Every day | ORAL | Status: DC
  Administered 2013-11-16 – 2013-11-18 (×3): 1 via ORAL
  Filled 2013-11-15 (×3): qty 1

## 2013-11-15 MED ORDER — SODIUM CHLORIDE 0.9 % IV BOLUS (SEPSIS)
500.0000 mL | Freq: Once | INTRAVENOUS | Status: AC
  Administered 2013-11-15: 500 mL via INTRAVENOUS

## 2013-11-15 NOTE — Progress Notes (Signed)
Addendum hgb is 7.8 today, transposed my numbers when typing 12:49 PM

## 2013-11-15 NOTE — Progress Notes (Signed)
ANTIBIOTIC CONSULT NOTE - FOLLOW UP  Pharmacy Consult for Vancomcyin Indication: pneumonia  Allergies  Allergen Reactions  . Codeine Nausea And Vomiting    Patient Measurements: Height: 5\' 4"  (162.6 cm) Weight: 121 lb 11.1 oz (55.2 kg) IBW/kg (Calculated) : 54.7   Vital Signs: Temp: 98.2 F (36.8 C) (03/20 0520) Temp src: Oral (03/20 0520) BP: 85/48 mmHg (03/20 0520) Pulse Rate: 70 (03/20 0520) Intake/Output from previous day: 03/19 0701 - 03/20 0700 In: 4542 [P.O.:720; I.V.:486.7; IV Piggyback:1150; TPN:2185.3] Out: 3335 [Urine:3475; Drains:20; Stool:400] Intake/Output from this shift:    Labs:  Recent Labs  11/13/13 0530 11/14/13 0515 11/15/13 0900  WBC 16.6* 15.2* 12.8*  HGB 8.2* 7.8* 7.8*  PLT 597* 661* 690*  CREATININE  --  0.42*  --    Estimated Creatinine Clearance: 63 ml/min (by C-G formula based on Cr of 0.42).  Recent Labs  11/12/13 1140 11/15/13 0900  VANCOTROUGH 11.1 22.2*     Microbiology: Recent Results (from the past 720 hour(s))  URINE CULTURE     Status: None   Collection Time    11/08/13  1:59 PM      Result Value Ref Range Status   Specimen Description URINE, CLEAN CATCH   Final   Special Requests NONE   Final   Culture  Setup Time     Final   Value: 11/09/2013 02:39     Performed at [facility]     Final   Value: NO GROWTH     Performed at [facility]   Culture     Final   Value: NO GROWTH     Performed at [facility]   Report Status 11/10/2013 FINAL   Final  CULTURE, BLOOD (ROUTINE X 2)     Status: None   Collection Time    11/08/13  5:00 PM      Result Value Ref Range Status   Specimen Description BLOOD LEFT HAND   Final   Special Requests BOTTLES DRAWN AEROBIC AND ANAEROBIC 10CC   Final   Culture  Setup Time     Final   Value: 11/08/2013 22:07     Performed at [facility]   Culture     Final   Value: NO GROWTH 5 DAYS     Performed at [facility]   Report  Status 11/14/2013 FINAL   Final  CULTURE, BLOOD (ROUTINE X 2)     Status: None   Collection Time    11/08/13  5:15 PM      Result Value Ref Range Status   Specimen Description BLOOD LEFT ARM   Final   Special Requests BOTTLES DRAWN AEROBIC AND ANAEROBIC 10CC   Final   Culture  Setup Time     Final   Value: 11/08/2013 22:07     Performed at [facility]   Culture     Final   Value: NO GROWTH 5 DAYS     Performed at [facility]   Report Status 11/14/2013 FINAL   Final  CLOSTRIDIUM DIFFICILE BY PCR     Status: None   Collection Time    11/09/13  2:23 AM      Result Value Ref Range Status   C difficile by pcr NEGATIVE  NEGATIVE Final   Comment: Performed at [place]   Start     Dose/Rate Route Frequency Ordered Stop   11/12/13 2200  vancomycin (VANCOCIN) 1,500 mg in sodium chloride 0.9 % 500 mL IVPB     1,500 mg 250 mL/hr over 120 Minutes Intravenous Every 12 hours 11/12/13 1437     11/11/13 0000  vancomycin (VANCOCIN) IVPB 1000 mg/200 mL premix  Status:  Discontinued     1,000 mg 200 mL/hr over 60 Minutes Intravenous Every 12 hours 11/10/13 1823 11/12/13 1437   11/09/13 0600  vancomycin (VANCOCIN) IVPB 750 mg/150 ml premix  Status:  Discontinued     750 mg 150 mL/hr over 60 Minutes Intravenous Every 12 hours 11/08/13 1656 11/08/13 1718   11/09/13 0600  vancomycin (VANCOCIN) 500 mg in sodium chloride 0.9 % 100 mL IVPB  Status:  Discontinued     500 mg 100 mL/hr over 60 Minutes Intravenous Every 12 hours 11/08/13 1718 11/10/13 1823   11/08/13 1730  vancomycin (VANCOCIN) IVPB 1000 mg/200 mL premix     1,000 mg 200 mL/hr over 60 Minutes Intravenous  Once 11/08/13 1655 11/08/13 1838   11/03/13 1600  piperacillin-tazobactam (ZOSYN) IVPB 3.375 g     3.375 g 12.5 mL/hr over 240 Minutes Intravenous Every 8 hours 11/03/13 1233     11/03/13 0800  piperacillin-tazobactam (ZOSYN) IVPB 3.375 g  Status:  Discontinued    Comments:  First dose  stat.   3.375 g 12.5 mL/hr over 240 Minutes Intravenous Every 8 hours 11/03/13 0734 11/03/13 1233      Assessment: 63 y/o F with stage IV NSCLC admitted 3/8 with perforated sigmoid colon s/p emergent ex lap, drainage of intra-abdominal abscess, sigmoid colectomy, colostomy.  TPN started 3/12.  CT angio chest 3/13 showed new RLL consolidation worrisome for PNA.  Patient had been on Zosyn for intra-abdominal issues since 3/8 and MD ordered Vancomycin to be added 3/13 for possible pneumonia.  Patient is stable from a respiratory standpoint but has ongoing leukocytosis with no clear etiology, continuing to cover for PNA and leukocytosis.    3/8 >> Zosyn (MD) >> 3/13 >> Vancomycin >>  Tmax: Afebrile WBCs: elevated but improving  Renal: SCr 0.41, CrCl ~63 ml/min (CG, using SCr 0.8)  Vanc trough elevated at 22.2mg /l on 1500mg  IV q12h.   Micro 3/13 Urine: NG final 3/13 Blood: NG final 3/14 Cdiff PCR negative.  Goal of Therapy:  Vancomycin trough level 15-20 mcg/ml Eradication of infection  Plan:   Decrease Vancomycin to 1250mg  IV q12h.  Repeat VT at [facility].  Clinically improving. Can abx be dc'd?  Romeo Rabon, PharmD, pager [phone]. 11/15/2013,10:59 AM.

## 2013-11-15 NOTE — Progress Notes (Signed)
PT Cancellation Note  ___Treatment cancelled today due to medical issues with patient which prohibited therapy  _X_ Treatment cancelled today due to patient receiving procedure          Out of room in Radiation  ___ Treatment cancelled today due to patient's refusal to participate   ___ Treatment cancelled today due to   Rica Koyanagi  PTA [facility]  Acute  Rehab Pager      [phone]

## 2013-11-15 NOTE — Progress Notes (Addendum)
PARENTERAL NUTRITION CONSULT NOTE - Follow up  Pharmacy Consult for TNA Indication: prolonged post-operative ileus  Allergies  Allergen Reactions  . Codeine Nausea And Vomiting    Patient Measurements: Height: 5' 4"  (162.6 cm) Weight: 121 lb 11.1 oz (55.2 kg) IBW/kg (Calculated) : 54.7  Vital Signs: Temp: 98.2 F (36.8 C) (03/20 0520) Temp src: Oral (03/20 0520) BP: 85/48 mmHg (03/20 0520) Pulse Rate: 70 (03/20 0520) Intake/Output from previous day: 03/19 0701 - 03/20 0700 In: 4542 [P.O.:720; I.V.:486.7; IV Piggyback:1150; TPN:2185.3] Out: 1610 [Urine:3475; Drains:20; Stool:400] Intake/Output from this shift:    Labs:  Recent Labs  11/13/13 0530 11/14/13 0515  WBC 16.6* 15.2*  HGB 8.2* 7.8*  HCT 25.7* 24.4*  PLT 597* 661*     Recent Labs  11/14/13 0515  NA 138  K 3.8  CL 100  CO2 29  GLUCOSE 162*  BUN 11  CREATININE 0.42*  CALCIUM 8.5  MG 1.8  PHOS 3.1  PROT 5.8*  ALBUMIN 1.7*  AST 20  ALT 21  ALKPHOS 189*  BILITOT 0.4  Corrected Calcium 3/16 = 10.3 Estimated Creatinine Clearance: 63 ml/min (by C-G formula based on Cr of 0.42).    Recent Labs  11/14/13 2354 11/15/13 0558 11/15/13 0753  GLUCAP 127* 132* 152*    Medications:  Scheduled:  . feeding supplement (ENSURE COMPLETE)  237 mL Oral BID BM  . heparin subcutaneous  5,000 Units Subcutaneous 3 times per day  . insulin aspart  0-5 Units Subcutaneous QHS  . insulin aspart  0-9 Units Subcutaneous TID WC  . pantoprazole  40 mg Oral Daily  . piperacillin-tazobactam (ZOSYN)  IV  3.375 g Intravenous Q8H  . sodium chloride  500 mL Intravenous Once  . vancomycin  1,500 mg Intravenous Q12H   Infusions:  . dextrose 5% lactated ringers with KCl 20 mEq/L 20 mL/hr at 11/13/13 1944  . Marland KitchenTPN (CLINIMIX-E) Adult 70 mL/hr at 11/14/13 1743   And  . fat emulsion 250 mL (11/14/13 1743)   PRN: ALPRAZolam, ibuprofen, morphine injection, ondansetron (ZOFRAN) IV, ondansetron, ondansetron,  oxyCODONE-acetaminophen, phenol, sodium chloride  Insulin Requirements in the past 24 hours:  6 units Novolog SSI (sensitive scale) TIDAC & HS scale. CBGs 127-175.  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  On 3/8 underwent emergency exploratory laparotomy, drainage of intra-abdominal abscesses, mobilization of splenic flexure, sigmoid colectomy, colostomy.   Bowel function has not yet returned and baseline prealbumin was 6.7.  TNA initiated 3/12 with pharmacy assistance requested.  3/18 started CL diet.  [facility] Per [facility], cont full TNA today but plan weaning 3/20 if tolerated diet.  3/20 advancing to soft diet and wean TNA off over next 48hrs.  Nutritional Goals: (per RD assessment 3/12) 80-100 grams of protein per day, 1600 - 1750 kCal/day  Goal TNA Formula and Regimen: Clinimix-E 5/15 at 70 mL/hr + Fat Emulsion 20% at 10 mL/hr to deliver 84 grams protein/day, 1672 kCal/day  TNA Access:  PICC On TNA Day #9 (3/12 - present)     Glucose/CBGs: mostly at goal < 150, with minimal SSI. No hx of DM. Electrolytes: wnl(3/19) Renal: SCr below LLN. BUN WNL(3/19) Hepatic:  Alk Phos remains slightly elevated, but  still < 2x ULN.  Other LFTs wnl(3/19). Triglycerides: 102 (3/13), 130 (3/16) Prealbumin:  6.7 (3/11), 7.2 (3/13), 13.8 (3/16)  Plan:   At 1800 reduce Clinimix E 5/15 to 59m/hr.  Continue Fat Emulsion 20% at 10 mL/hr.  Switch MVI to oral tomorrow.  [facility]  Continue CBGs/sensitive SSI TIDAC and bedtime scale at HS. Consider stopping when TNA dc'd.  Check Cmet, Mag and Phos in AM.  DC routine TNA labs.  Visited with Ms. Quijas, told her the plan with TNA and encouraged her to eat as much  as she can tolerate.  TRomeo Rabon PharmD, pager [phone] 11/15/2013,8:35 AM.

## 2013-11-15 NOTE — Progress Notes (Signed)
Department of Radiation Oncology  Phone:  [phone] Fax:        [phone]   INPATIENT  Weekly Treatment Note    Name: Alexis [name] Date: 11/15/2013 MRN: [id number] DOB: [birth year]   Current dose: 22 Gy  Current fraction: 11   MEDICATIONS: No current facility-administered medications for this encounter.   No current outpatient prescriptions on file.   Facility-Administered Medications Ordered in Other Encounters  Medication Dose Route Frequency Provider Last Rate Last Dose  . ALPRAZolam Duanne Moron) tablet 0.5 mg  0.5 mg Oral QID PRN Earnstine Regal, PA-C      . dextrose 5% in lactated ringers with KCl 20 mEq/L infusion   Intravenous Continuous Dara Hoyer, RPH 20 mL/hr at 11/13/13 1944    . TPN (CLINIMIX-E) Adult   Intravenous Continuous TPN Shann Medal, MD 70 mL/hr at 11/14/13 1743     And  . fat emulsion 20 % infusion 250 mL  250 mL Intravenous Continuous TPN Shann Medal, MD 10 mL/hr at 11/14/13 1743 250 mL at 11/14/13 1743  . TPN (CLINIMIX-E) Adult   Intravenous Continuous TPN Shann Medal, MD       And  . fat emulsion 20 % infusion 250 mL  250 mL Intravenous Continuous TPN Shann Medal, MD      . feeding supplement (ENSURE COMPLETE) (ENSURE COMPLETE) liquid 237 mL  237 mL Oral BID BM Christie Beckers, RD   237 mL at 11/15/13 1400  . heparin injection 5,000 Units  5,000 Units Subcutaneous 3 times per day Odis Hollingshead, MD   5,000 Units at 11/15/13 1432  . ibuprofen (ADVIL,MOTRIN) tablet 600 mg  600 mg Oral Q6H PRN Earnstine Regal, PA-C      . insulin aspart (novoLOG) injection 0-5 Units  0-5 Units Subcutaneous QHS Shann Medal, MD      . insulin aspart (novoLOG) injection 0-9 Units  0-9 Units Subcutaneous TID WC Shann Medal, MD   1 Units at 11/15/13 1244  . morphine 2 MG/ML injection 2-6 mg  2-6 mg Intravenous Q2H PRN Odis Hollingshead, MD   2 mg at 11/15/13 2119  . [START ON 11/16/2013] multivitamin with minerals tablet 1 tablet  1 tablet Oral  Daily Shann Medal, MD      . ondansetron [facility]) tablet 4 mg  4 mg Oral Q6H PRN Odis Hollingshead, MD       Or  . ondansetron [facility]) injection 4 mg  4 mg Intravenous Q6H PRN Odis Hollingshead, MD      . ondansetron [facility]) tablet 8 mg  8 mg Oral Q8H PRN Earnstine Regal, PA-C      . oxyCODONE (ROXICODONE INTENSOL) 20 MG/ML concentrated solution 5-10 mg  5-10 mg Oral Q4H PRN Earnstine Regal, PA-C      . pantoprazole (PROTONIX) EC tablet 40 mg  40 mg Oral Daily Earnstine Regal, PA-C   40 mg at 11/15/13 4174  . phenol (CHLORASEPTIC) mouth spray 1 spray  1 spray Mouth/Throat PRN Ralene Ok, MD   1 spray at 11/05/13 0818  . piperacillin-tazobactam (ZOSYN) IVPB 3.375 g  3.375 g Intravenous Q8H Odis Hollingshead, MD   3.375 g at 11/15/13 1545  . sodium chloride 0.9 % injection 10-40 mL  10-40 mL Intracatheter PRN Ralene Ok, MD   10 mL at 11/14/13 0511  . vancomycin (VANCOCIN) 1,250 mg in sodium chloride 0.9 % 250 mL IVPB  1,250 mg Intravenous Q12H Rhunette Croft  Rosenbower, MD   1,250 mg at 11/15/13 1124     ALLERGIES: Codeine   LABORATORY DATA:  Lab Results  Component Value Date   WBC 12.8* 11/15/2013   HGB 7.8* 11/15/2013   HCT 23.3* 11/15/2013   MCV 95.1 11/15/2013   PLT 690* 11/15/2013   Lab Results  Component Value Date   NA 138 11/14/2013   K 3.8 11/14/2013   CL 100 11/14/2013   CO2 29 11/14/2013   Lab Results  Component Value Date   ALT 21 11/14/2013   AST 20 11/14/2013   ALKPHOS 189* 11/14/2013   BILITOT 0.4 11/14/2013     NARRATIVE: Alexis [name] was seen today for weekly treatment management. The chart was checked and the patient's films were reviewed. The patient states she feels much better. No problems or complaints with her radiation treatment. She states that her pain is improved in the right chest area posteriorly.  PHYSICAL EXAMINATION: The lower, in no acute distress, in good spirits      ASSESSMENT: The patient is doing satisfactorily with treatment.  PLAN:  We will continue with the patient's radiation treatment as planned.

## 2013-11-15 NOTE — Progress Notes (Signed)
12 Days Post-Op  Subjective: She is feeling better feels weak when she gets up.  Tolerating PO's and ostomy is working.  Objective: Vital signs in last 24 hours: Temp:  [97.9 F (36.6 C)-98.8 F (37.1 C)] 98.2 F (36.8 C) (03/20 0520) Pulse Rate:  [64-79] 70 (03/20 0520) Resp:  [18] 18 (03/20 0520) BP: (85-99)/(48-57) 85/48 mmHg (03/20 0520) SpO2:  [95 %-99 %] 99 % (03/20 0520) Last BM Date: 11/14/13 Only 400 from ostomy record, looks like 1st shift only  Drain 20 ml 720 PO recorded.  Full liquid diet Her BP is down some, so she may be a little dry I/O shows her 9.6 liters Negative since 3/8. I will check weight Afebrile,  CBC is pending Intake/Output from previous day: 03/19 0701 - 03/20 0700 In: 4542 [P.O.:720; I.V.:486.7; IV Piggyback:1150; TPN:2185.3] Out: 6789 [Urine:3475; Drains:20; Stool:400] Intake/Output this shift:    General appearance: alert, cooperative and no distress Resp: clear to auscultation bilaterally and few rales at base. GI: soft, sore, still has allot of necrotic fat tissue at the base of her wound..  Lab Results:   Recent Labs  11/13/13 0530 11/14/13 0515  WBC 16.6* 15.2*  HGB 8.2* 7.8*  HCT 25.7* 24.4*  PLT 597* 661*    BMET  Recent Labs  11/14/13 0515  NA 138  K 3.8  CL 100  CO2 29  GLUCOSE 162*  BUN 11  CREATININE 0.42*  CALCIUM 8.5   PT/INR No results found for this basename: LABPROT, INR,  in the last 72 hours   Recent Labs Lab 11/11/13 0330 11/14/13 0515  AST 11 20  ALT 14 21  ALKPHOS 162* 189*  BILITOT 0.3 0.4  PROT 5.8* 5.8*  ALBUMIN 1.7* 1.7*     Lipase     Component Value Date/Time   LIPASE 8* 11/03/2013 0304     Studies/Results: No results found.  Medications: . feeding supplement (ENSURE COMPLETE)  237 mL Oral BID BM  . heparin subcutaneous  5,000 Units Subcutaneous 3 times per day  . insulin aspart  0-5 Units Subcutaneous QHS  . insulin aspart  0-9 Units Subcutaneous TID WC  . pantoprazole  40  mg Oral Daily  . piperacillin-tazobactam (ZOSYN)  IV  3.375 g Intravenous Q8H  . vancomycin  1,500 mg Intravenous Q12H    Assessment/Plan 1. Perforated sigmoid colon with multiple abscesses  Emergent exploratory laparotomy, drainage of intra-abdominal abscesses, mobilization of splenic flexure, sigmoid colectomy, colostomy---Dr. Zella Richer 11/03/13  2. Hx metastatic lung cancer with thoracic radiotherapy--Dr. [name] (treatment #7/25 yesterday)  3. COPD/Tobacco use hx  4. Hypertension  5. Anemia, this is new  5. Deconditioning and Malnutrition (prealbumin 6.7 and started on TNA 11/07/13)  7. Post op ileus  8. Leukocytosis/possible Healthcare associated pneumonia (11 days of Zosyn 6 days of vancomycin.)   Plan:  Low residual diet, I am going to give her some extra fluid and check some orthostatic BP's to see how she is doing with her BP.  Daily Weights to help monitor her I/O balance. Dr. Zella Richer wants her WBC to be back to normal before stopping IV antibiotics.  Wean TNA over the next 2 days.        LOS: 12 days    Alexis [name] 11/15/2013

## 2013-11-15 NOTE — Progress Notes (Signed)
Patient seen and examined.  She is slowly improving.  She has had a persistent leukocytosis and if this does not improve will need to check a CT of abd/pelvis for an occult abscess.

## 2013-11-15 NOTE — Consult Note (Signed)
[facility] PROGRESS NOTE  Alexis [name] QXI:[id number] DOB: 07-12-1951 DOA: 11/03/2013 PCP: [name], MD  Assessment/Plan: Active Problems:  Pneumoperitoneum  Perforation of sigmoid colon  Leukocytosis, unspecified  HCAP (healthcare-associated pneumonia)    63 y.o. 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 She presented to the hospital with abdominal distention and pain and was found to have pneumoperitoneum. She underwent emergent surgery on March 8 and was found to have perforated sigmoid colon with multiple intra-abdominal abscess ease. She underwent drainage of these abscesses, sigmoid colectomy and colostomy. She's currently on TPN. She is getting radiation treatments on a daily basis for her lung cancer and brain mets. It was noted by the surgical staff that her WBC was increasing. She underwent a CT scan of her chest, Abdomen, pelvis and was noted to have small pleural effusion and possibility of an infiltrate in the right lower lung  1. Leukocytosis/HCAP  -Recent CT scan suggested pneumonia. The urinalysis was unremarkable. The intra-abdominal abscesses appeared to have decreased in size. No cultures available from theses abscesses. C. Difficile PCR is negative. Blood cultures are negative as well.  -Treat possible pneumonia as below. Leukocytosis could also be due to underlying state of inflammation from her abdominal issues. No other source of infection is identified.  2. Possible Healthcare Associated Pneumonia  -She was already on Zosyn 3/8. Vancomycin was added 3/13. Blood cultures obtained and negative so far. Incentive spirometry. Oxygen as needed. Stable from respiratory standpoint.  -afebrile; blood cultures neg; may change to PO atx levofloxacin till 3/21 ;  3. Metastatic Lung Cancer  -Management as per oncology and radiation oncologist. She is getting radiation treatments.   4. Perforation of sigmoid colon status post sigmoid colectomy with several intra-abdominal abscesses  -Management per surgery; on  TPN.  5. Anemia  -Likely from recent acute illness. Per RN some blood was noted through colostomy a few days ago. None recently. continue to monitor.   [facility] will sign off at this time. Please call if there are any questions.   Code Status: full Family Communication: d/w aptient (indicate person spoken with, relationship, and if by phone, the number) Disposition Plan: per primary  Procedures: Exploratory lap, sigmoid colectomy, abscess drainage 3/8  Antibiotics:  Zosyn 3/8-->  Vanc 3/13-->   HPI/Subjective: alert  Objective: Filed Vitals:   11/15/13 0520  BP: 85/48  Pulse: 70  Temp: 98.2 F (36.8 C)  Resp: 18    Intake/Output Summary (Last 24 hours) at 11/15/13 1101 Last data filed at 11/15/13 1000  Gross per 24 hour  Intake   4202 ml  Output   2690 ml  Net   1512 ml   Filed Weights   11/03/13 0021 11/04/13 0400 11/05/13 0400  Weight: 52.164 kg (115 lb) 54.3 kg (119 lb 11.4 oz) 55.2 kg (121 lb 11.1 oz)    Exam:   General:  alert  Cardiovascular: s1,s2 rrr  Respiratory: few rales   Abdomen: soft, colostomy patent   Musculoskeletal: no LE edema   Data Reviewed: Basic Metabolic Panel:  Recent Labs Lab 11/09/13 0422 11/10/13 0546 11/11/13 0330 11/14/13 0515  NA 137 136* 134* 138  K 4.0 4.1 3.9 3.8  CL 99 98 95* 100  CO2 30 30 30 29   GLUCOSE 145* 150* 134* 162*  BUN 8 11 13 11   CREATININE 0.40* 0.38* 0.41* 0.42*  CALCIUM 8.3* 8.4 8.5 8.5  MG 1.8 1.9 1.9 1.8  PHOS 3.2 3.6 4.0 3.1   Liver Function Tests:  Recent Labs Lab 11/11/13 0330 11/14/13 0515  AST 11 20  ALT 14 21  ALKPHOS 162* 189*  BILITOT 0.3 0.4  PROT 5.8* 5.8*  ALBUMIN 1.7* 1.7*   No results found for this basename: LIPASE, AMYLASE,  in the last 168 hours No results found for this basename: AMMONIA,  in the last 168 hours CBC:  Recent  Labs Lab 11/10/13 0546 11/11/13 0330 11/13/13 0530 11/14/13 0515 11/15/13 0900  WBC 17.8* 19.2* 16.6* 15.2* 12.8*  NEUTROABS  --  15.8* 13.2*  --   --   HGB 8.4* 8.6* 8.2* 7.8* 7.8*  HCT 25.5* 26.1* 25.7* 24.4* 23.3*  MCV 94.4 96.0 96.6 96.1 95.1  PLT 431* 463* 597* 661* 690*   Cardiac Enzymes: No results found for this basename: CKTOTAL, CKMB, CKMBINDEX, TROPONINI,  in the last 168 hours BNP (last 3 results) No results found for this basename: PROBNP,  in the last 8760 hours CBG:  Recent Labs Lab 11/14/13 1752 11/14/13 2141 11/14/13 2354 11/15/13 0558 11/15/13 0753  GLUCAP 150* 167* 127* 132* 152*    Recent Results (from the past 240 hour(s))  URINE CULTURE     Status: None   Collection Time    11/08/13  1:59 PM      Result Value Ref Range Status   Specimen Description URINE, CLEAN CATCH   Final   Special Requests NONE   Final   Culture  Setup Time     Final   Value: 11/09/2013 02:39     Performed at [facility] Count     Final   Value: NO GROWTH     Performed at [facility]   Culture     Final   Value: NO GROWTH     Performed at [facility]   Report Status 11/10/2013 FINAL   Final  CULTURE, BLOOD (ROUTINE X 2)     Status: None   Collection Time    11/08/13  5:00 PM      Result Value Ref Range Status   Specimen Description BLOOD LEFT HAND   Final   Special Requests BOTTLES DRAWN AEROBIC AND ANAEROBIC 10CC   Final   Culture  Setup Time     Final   Value: 11/08/2013 22:07     Performed at [facility]   Culture     Final   Value: NO GROWTH 5 DAYS     Performed at [facility]   Report Status 11/14/2013 FINAL   Final  CULTURE, BLOOD (ROUTINE X 2)     Status: None   Collection Time    11/08/13  5:15 PM      Result Value Ref Range Status   Specimen Description BLOOD LEFT ARM   Final   Special Requests BOTTLES DRAWN AEROBIC AND ANAEROBIC 10CC   Final   Culture  Setup Time     Final   Value: 11/08/2013  22:07     Performed at [facility]   Culture     Final   Value: NO GROWTH 5 DAYS     Performed at [facility]   Report Status 11/14/2013 FINAL   Final  CLOSTRIDIUM DIFFICILE BY PCR     Status: None   Collection Time    11/09/13  2:23 AM      Result Value Ref Range Status   C difficile by pcr NEGATIVE  NEGATIVE Final   Comment: Performed at [facility]     Studies: No results found.  Scheduled Meds: . feeding supplement (ENSURE COMPLETE)  237 mL Oral BID BM  . heparin subcutaneous  5,000 Units Subcutaneous 3 times per day  . insulin aspart  0-5 Units Subcutaneous QHS  . insulin aspart  0-9 Units Subcutaneous TID WC  . [START ON 11/16/2013] multivitamin with minerals  1 tablet Oral Daily  . pantoprazole  40 mg Oral Daily  . piperacillin-tazobactam (ZOSYN)  IV  3.375 g Intravenous Q8H  . sodium chloride  500 mL Intravenous Once  . vancomycin  1,500 mg Intravenous Q12H   Continuous Infusions: . dextrose 5% lactated ringers with KCl 20 mEq/L 20 mL/hr at 11/13/13 1944  . Marland KitchenTPN (CLINIMIX-E) Adult 70 mL/hr at 11/14/13 1743   And  . fat emulsion 250 mL (11/14/13 1743)  . Marland KitchenTPN (CLINIMIX-E) Adult     And  . fat emulsion      Active Problems:   Pneumoperitoneum   Perforation of sigmoid colon   Leukocytosis, unspecified   HCAP (healthcare-associated pneumonia)    Time spent: >35 minutes     Kinnie Feil  [facility] Pager [phone]. If 7PM-7AM, please contact night-coverage at [email or web address], password [facility] 11/15/2013, 11:01 AM  LOS: 12 days

## 2013-11-15 NOTE — Progress Notes (Signed)
[facility] Therapy Treatment Record Phone [phone]   Radiation Therapy was administered to Alexis [name] on: 11/15/2013  2:26 PM and was treatment # 11 out of a planned course of 25 treatments.

## 2013-11-15 NOTE — Progress Notes (Signed)
Patient seen and examined.  Slowly improving.

## 2013-11-15 NOTE — Telephone Encounter (Signed)
Called the floor asking nurse AnnaRN how patient statsus was if she was going to get blood with a hgb 7.3, she stated labs were late she was going to cll MD and get back with [place], patient to have radiation treatment today at 145pm 12:47 PM

## 2013-11-16 LAB — COMPREHENSIVE METABOLIC PANEL
ALT: 36 U/L — ABNORMAL HIGH (ref 0–35)
AST: 21 U/L (ref 0–37)
Albumin: 1.7 g/dL — ABNORMAL LOW (ref 3.5–5.2)
Alkaline Phosphatase: 145 U/L — ABNORMAL HIGH (ref 39–117)
BUN: 13 mg/dL (ref 6–23)
CO2: 28 mEq/L (ref 19–32)
Calcium: 8.6 mg/dL (ref 8.4–10.5)
Chloride: 100 mEq/L (ref 96–112)
Creatinine, Ser: 0.48 mg/dL — ABNORMAL LOW (ref 0.50–1.10)
GFR calc Af Amer: >90 mL/min (ref >90)
GFR calc non Af Amer: >90 mL/min (ref >90)
Glucose, Bld: 131 mg/dL — ABNORMAL HIGH (ref 70–99)
Potassium: 3.5 mEq/L — ABNORMAL LOW (ref 3.7–5.3)
Sodium: 138 mEq/L (ref 137–147)
Total Bilirubin: 0.2 mg/dL — ABNORMAL LOW (ref 0.3–1.2)
Total Protein: 5.6 g/dL — ABNORMAL LOW (ref 6.0–8.3)

## 2013-11-16 LAB — CBC
HCT: 22.5 % — ABNORMAL LOW (ref 36.0–46.0)
Hemoglobin: 7.4 g/dL — ABNORMAL LOW (ref 12.0–15.0)
MCH: 31.5 pg (ref 26.0–34.0)
MCHC: 32.9 g/dL (ref 30.0–36.0)
MCV: 95.7 fL (ref 78.0–100.0)
Platelets: 694 10*3/uL — ABNORMAL HIGH (ref 150–400)
RBC: 2.35 MIL/uL — ABNORMAL LOW (ref 3.87–5.11)
RDW: 17.1 % — ABNORMAL HIGH (ref 11.5–15.5)
WBC: 11.2 10*3/uL — ABNORMAL HIGH (ref 4.0–10.5)

## 2013-11-16 LAB — GLUCOSE, CAPILLARY
Glucose-Capillary: 106 mg/dL — ABNORMAL HIGH (ref 70–99)
Glucose-Capillary: 125 mg/dL — ABNORMAL HIGH (ref 70–99)
Glucose-Capillary: 127 mg/dL — ABNORMAL HIGH (ref 70–99)
Glucose-Capillary: 136 mg/dL — ABNORMAL HIGH (ref 70–99)

## 2013-11-16 LAB — MAGNESIUM: Magnesium: 1.7 mg/dL (ref 1.5–2.5)

## 2013-11-16 LAB — PHOSPHORUS: Phosphorus: 3.9 mg/dL (ref 2.3–4.6)

## 2013-11-16 MED ORDER — POTASSIUM CHLORIDE 20 MEQ/15ML (10%) PO LIQD
40.0000 meq | Freq: Once | ORAL | Status: AC
  Administered 2013-11-16: 40 meq via ORAL
  Filled 2013-11-16: qty 30

## 2013-11-16 NOTE — Progress Notes (Signed)
13 Days Post-Op  Subjective: Looks well in good spirits. Pleasant.   Objective: Vital signs in last 24 hours: Temp:  [97.6 F (36.4 C)-97.9 F (36.6 C)] 97.9 F (36.6 C) (03/21 0520) Pulse Rate:  [71-76] 71 (03/21 0520) Resp:  [18] 18 (03/21 0520) BP: (88-117)/(46-73) 92/50 mmHg (03/21 0520) SpO2:  [97 %-98 %] 98 % (03/21 0520) Weight:  [112 lb 3.4 oz (50.9 kg)] 112 lb 3.4 oz (50.9 kg) (03/21 0500) Last BM Date: 11/15/13  Intake/Output from previous day: 03/20 0701 - 03/21 0700 In: 1468.3 [P.O.:360; I.V.:408.3; IV Piggyback:350; TPN:350] Out: 2650 [Urine:2450; Stool:200] Intake/Output this shift: Total I/O In: -  Out: 300 [Urine:300]  Incision/Wound:wounds open and clean  Flat abdomen.  Ostomy pink and viable.   Lab Results:   Recent Labs  11/15/13 0900 11/16/13 0530  WBC 12.8* 11.2*  HGB 7.8* 7.4*  HCT 23.3* 22.5*  PLT 690* 694*   BMET  Recent Labs  11/14/13 0515 11/16/13 0530  NA 138 138  K 3.8 3.5*  CL 100 100  CO2 29 28  GLUCOSE 162* 131*  BUN 11 13  CREATININE 0.42* 0.48*  CALCIUM 8.5 8.6   PT/INR No results found for this basename: LABPROT, INR,  in the last 72 hours ABG No results found for this basename: PHART, PCO2, PO2, HCO3,  in the last 72 hours  Studies/Results: No results found.  Anti-infectives: Anti-infectives   Start     Dose/Rate Route Frequency Ordered Stop   11/15/13 1400  vancomycin (VANCOCIN) 1,250 mg in sodium chloride 0.9 % 250 mL IVPB     1,250 mg 166.7 mL/hr over 90 Minutes Intravenous Every 12 hours 11/15/13 1105     11/12/13 2200  vancomycin (VANCOCIN) 1,500 mg in sodium chloride 0.9 % 500 mL IVPB  Status:  Discontinued     1,500 mg 250 mL/hr over 120 Minutes Intravenous Every 12 hours 11/12/13 1437 11/15/13 1105   11/11/13 0000  vancomycin (VANCOCIN) IVPB 1000 mg/200 mL premix  Status:  Discontinued     1,000 mg 200 mL/hr over 60 Minutes Intravenous Every 12 hours 11/10/13 1823 11/12/13 1437   11/09/13 0600   vancomycin (VANCOCIN) IVPB 750 mg/150 ml premix  Status:  Discontinued     750 mg 150 mL/hr over 60 Minutes Intravenous Every 12 hours 11/08/13 1656 11/08/13 1718   11/09/13 0600  vancomycin (VANCOCIN) 500 mg in sodium chloride 0.9 % 100 mL IVPB  Status:  Discontinued     500 mg 100 mL/hr over 60 Minutes Intravenous Every 12 hours 11/08/13 1718 11/10/13 1823   11/08/13 1730  vancomycin (VANCOCIN) IVPB 1000 mg/200 mL premix     1,000 mg 200 mL/hr over 60 Minutes Intravenous  Once 11/08/13 1655 11/08/13 1838   11/03/13 1600  piperacillin-tazobactam (ZOSYN) IVPB 3.375 g     3.375 g 12.5 mL/hr over 240 Minutes Intravenous Every 8 hours 11/03/13 1233     11/03/13 0800  piperacillin-tazobactam (ZOSYN) IVPB 3.375 g  Status:  Discontinued    Comments:  First dose stat.   3.375 g 12.5 mL/hr over 240 Minutes Intravenous Every 8 hours 11/03/13 0734 11/03/13 1233      Assessment/Plan  1. Perforated sigmoid colon with multiple abscesses  Emergent exploratory laparotomy, drainage of intra-abdominal abscesses, mobilization of splenic flexure, sigmoid colectomy, colostomy---Dr. Zella Richer 11/03/13  2. Hx metastatic lung cancer with thoracic radiotherapy--Dr. [name] (treatment #7/25 yesterday)  3. COPD/Tobacco use hx  4. Hypertension  5. Anemia, this is new  5. Deconditioning  and Malnutrition (prealbumin 6.7 and started on TNA 11/07/13)  7. Post op ileus  8. Leukocytosis/possible Healthcare associated pneumonia (11 days of Zosyn 6 days of vancomycin.)   Plan: Low residual diet, anemia  Noted and may explain fatigue.  Would hold on transfusion at this point. Anemia of chronic disease as well . Wean TNA over the next 2 days.     LOS: 13 days    Alexis [name] A. 11/16/2013

## 2013-11-16 NOTE — Progress Notes (Signed)
PARENTERAL NUTRITION CONSULT NOTE - Follow up  Pharmacy Consult for TNA Indication: prolonged post-operative ileus  Allergies  Allergen Reactions  . Codeine Nausea And Vomiting    Patient Measurements: Height: _0  (162.6 cm) Weight: 112 lb 3.4 oz (50.9 kg) IBW/kg (Calculated) : 54.7  Vital Signs: Temp: 97.9 F (36.6 C) (03/21 0520) Temp src: Oral (03/21 0520) BP: 92/50 mmHg (03/21 0520) Pulse Rate: 71 (03/21 0520) Intake/Output from previous day: 03/20 0701 - 03/21 0700 In: 1468.3 [P.O.:360; I.V.:408.3; IV Piggyback:350; TPN:350] Out: 2650 [Urine:2450; Stool:200] Intake/Output from this shift: Total I/O In: 285 [P.O.:120; I.V.:165] Out: 350 [Urine:300; Stool:50]  Labs:  Recent Labs  11/14/13 0515 11/15/13 0900 11/16/13 0530  WBC 15.2* 12.8* 11.2*  HGB 7.8* 7.8* 7.4*  HCT 24.4* 23.3* 22.5*  PLT 661* 690* 694*     Recent Labs  11/14/13 0515 11/16/13 0530  NA 138 138  K 3.8 3.5*  CL 100 100  CO2 29 28  GLUCOSE 162* 131*  BUN 11 13  CREATININE 0.42* 0.48*  CALCIUM 8.5 8.6  MG 1.8 1.7  PHOS 3.1 3.9  PROT 5.8* 5.6*  ALBUMIN 1.7* 1.7*  AST 20 21  ALT 21 36*  ALKPHOS 189* 145*  BILITOT 0.4 0.2*  Corrected Calcium 3/16 = 10.3 Estimated Creatinine Clearance: 58.6 ml/min (by C-G formula based on Cr of 0.48).    Recent Labs  11/15/13 2354 11/16/13 0735 11/16/13 1206  GLUCAP 125* 127* 136*    Medications:  Scheduled:  . feeding supplement (ENSURE COMPLETE)  237 mL Oral BID BM  . heparin subcutaneous  5,000 Units Subcutaneous 3 times per day  . insulin aspart  0-5 Units Subcutaneous QHS  . insulin aspart  0-9 Units Subcutaneous TID WC  . multivitamin with minerals  1 tablet Oral Daily  . pantoprazole  40 mg Oral Daily  . piperacillin-tazobactam (ZOSYN)  IV  3.375 g Intravenous Q8H  . vancomycin  1,250 mg Intravenous Q12H   Infusions:  . dextrose 5% lactated ringers with KCl 20 mEq/L 20 mL/hr at 11/13/13 1944  . Marland KitchenTPN (CLINIMIX-E) Adult 40  mL/hr at 11/15/13 1706   And  . fat emulsion 250 mL (11/15/13 1705)   PRN: ALPRAZolam, ibuprofen, morphine injection, ondansetron (ZOFRAN) IV, ondansetron, ondansetron, oxyCODONE, phenol, sodium chloride  Insulin Requirements in the past 24 hours:  3 units Novolog SSI (sensitive scale) TIDAC & HS scale.  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  On 3/8 underwent emergency exploratory laparotomy, drainage of intra-abdominal abscesses, mobilization of splenic flexure, sigmoid colectomy, colostomy.   Bowel function has not yet returned and baseline prealbumin was 6.7.  TNA initiated 3/12 with pharmacy assistance requested.  3/18 started CL diet.  [facility], cont full TNA today but plan weaning 3/20 if tolerated diet.  3/20 advancing to soft diet and wean TNA off over next 48hrs.  Nutritional Goals: (per RD assessment 3/12) 80-100 grams of protein per day, 1600 - 1750 kCal/day  Goal TNA Formula and Regimen: Clinimix-E 5/15 at 70 mL/hr + Fat Emulsion 20% at 10 mL/hr to deliver 84 grams protein/day, 1672 kCal/day  TNA Access:  PICC On TNA Day #10 (3/12 -  present)     Glucose/CBGs: at goal < 150, with minimal SSI. No hx of DM. Electrolytes: K 3.5, other lytes WNL. Phos 3.9, Mag 1.7 Renal: SCr below LLN. BUN WNL Hepatic:  Alk Phos remains slightly elevated, but still < 2x ULN. ALT slightly increased to 36. Other LFTs wnl. Triglycerides: 102 (3/13), 130 (3/16) Prealbumin:  6.7 (3/11), 7.2 (3/13), 13.8 (3/16)  Plan:   At 1800 turn Clinimix E 5/15 to OFF.  At 1800 turn Fat Emulsion 20% to OFF.  Continue MVI PO  Potassium 40 mEq PO liquid x1  Continue mIVF at  [facility].  Continue CBGs/sensitive SSI TIDAC and bedtime scale at HS.  Pharmacy will sign-off, please re-consult if needed  Thank you for the consult.  Johny Drilling, PharmD, BCPS Pager: [phone] Pharmacy: [phone] 11/16/2013 1:33 PM

## 2013-11-16 NOTE — Progress Notes (Signed)
Physical Therapy Treatment Patient Details Name: Alexis [name] MRN: [id number] DOB: 1951/04/10 Today's Date: 11/16/2013 Time: 1049-1100 PT Time Calculation (min): 11 min  PT Assessment / Plan / Recommendation  History of Present Illness 64 yo female admitted with emergent exp lap 3/8, multiple abscesses. Hx of met lung cancer, HTN, anemia   PT Comments   Pt making great progress with mobility and currently supervision level.  Will likely only see for one more visit as pt has supervision level goals and reports her sister will be coming into town to stay with her a few weeks.  Pt reports possible d/c home Monday.   Follow Up Recommendations  Home health PT;Supervision/Assistance - 24 hour     Does the patient have the potential to tolerate intense rehabilitation     Barriers to Discharge        Equipment Recommendations  None recommended by PT    Recommendations for Other Services    Frequency Min 3X/week   Progress towards PT Goals Progress towards PT goals: Progressing toward goals  Plan Current plan remains appropriate    Precautions / Restrictions Precautions Precautions: Fall Precaution Comments: multiple lines/leads, drains   Pertinent Vitals/Pain Pt reports pain earlier however received pain meds and currently feels much better    Mobility  Bed Mobility Overal bed mobility: Needs Assistance Bed Mobility: Supine to Sit Supine to sit: Modified independent (Device/Increase time) Transfers Overall transfer level: Needs assistance Equipment used: None Transfers: Sit to/from Stand Sit to Stand: Supervision Ambulation/Gait Ambulation/Gait assistance: Supervision Ambulation Distance (Feet): 500 Feet Assistive device: Rolling walker (2 wheeled) Gait velocity: WFL General Gait Details: pt pushed IV pole, steady gait    Exercises     PT Diagnosis:    PT Problem List:   PT Treatment Interventions:     PT Goals (current goals can now be found in the care plan  section)    Visit Information  Last PT Received On: 11/16/13 Assistance Needed: +1 History of Present Illness: 63 yo female admitted with emergent exp lap 3/8, multiple abscesses. Hx of met lung cancer, HTN, anemia    Subjective Data      Cognition  Cognition Arousal/Alertness: Awake/alert Behavior During Therapy: WFL for tasks assessed/performed Overall Cognitive Status: Within Functional Limits for tasks assessed    Balance     End of Session PT - End of Session Activity Tolerance: Patient tolerated treatment well Patient left: in chair;with call bell/phone within reach   GP     Dilcia Rybarczyk,KATHrine E 11/16/2013, 12:05 PM Carmelia Bake, PT, DPT 11/16/2013 Pager: [phone]

## 2013-11-17 LAB — GLUCOSE, CAPILLARY
Glucose-Capillary: 109 mg/dL — ABNORMAL HIGH (ref 70–99)
Glucose-Capillary: 138 mg/dL — ABNORMAL HIGH (ref 70–99)
Glucose-Capillary: 96 mg/dL (ref 70–99)
Glucose-Capillary: 102 mg/dL — ABNORMAL HIGH (ref 70–99)

## 2013-11-17 LAB — CBC
HCT: 23.3 % — ABNORMAL LOW (ref 36.0–46.0)
Hemoglobin: 7.7 g/dL — ABNORMAL LOW (ref 12.0–15.0)
MCH: 31.8 pg (ref 26.0–34.0)
MCHC: 33 g/dL (ref 30.0–36.0)
MCV: 96.3 fL (ref 78.0–100.0)
Platelets: 712 10*3/uL — ABNORMAL HIGH (ref 150–400)
RBC: 2.42 MIL/uL — ABNORMAL LOW (ref 3.87–5.11)
RDW: 17.3 % — ABNORMAL HIGH (ref 11.5–15.5)
WBC: 10.8 10*3/uL — ABNORMAL HIGH (ref 4.0–10.5)

## 2013-11-17 MED ORDER — SODIUM CHLORIDE 0.9 % IJ SOLN
3.0000 mL | Freq: Two times a day (BID) | INTRAMUSCULAR | Status: DC
  Administered 2013-11-18: 3 mL via INTRAVENOUS

## 2013-11-17 MED ORDER — SODIUM CHLORIDE 0.9 % IV SOLN: 250.0000 mL | INTRAVENOUS | Status: DC | PRN

## 2013-11-17 MED ORDER — SODIUM CHLORIDE 0.9 % IJ SOLN: 3.0000 mL | INTRAMUSCULAR | Status: DC | PRN

## 2013-11-17 NOTE — Progress Notes (Signed)
14 Days Post-Op  Subjective: DOING VERY WELL.  Objective: Vital signs in last 24 hours: Temp:  [98.2 F (36.8 C)-98.6 F (37 C)] 98.3 F (36.8 C) (03/22 0520) Pulse Rate:  [73-88] 73 (03/22 0520) Resp:  [18] 18 (03/22 0520) BP: (93-104)/(55-58) 93/56 mmHg (03/22 0520) SpO2:  [96 %-98 %] 96 % (03/22 0520) Weight:  [112 lb 3.2 oz (50.894 kg)] 112 lb 3.2 oz (50.894 kg) (03/22 0520) Last BM Date: 11/15/13  Intake/Output from previous day: 03/21 0701 - 03/22 0700 In: 2335 [P.O.:240; I.V.:495; IV Piggyback:950; TPN:650] Out: 2300 [Urine:2200; Stool:100] Intake/Output this shift:    Incision/Wound:OPEN WOUND CLEAN  SOFT nt OSTOMY FUNCTIONING WELL PINK  Lab Results:   Recent Labs  11/16/13 0530 11/17/13 0445  WBC 11.2* 10.8*  HGB 7.4* 7.7*  HCT 22.5* 23.3*  PLT 694* 712*   BMET  Recent Labs  11/16/13 0530  NA 138  K 3.5*  CL 100  CO2 28  GLUCOSE 131*  BUN 13  CREATININE 0.48*  CALCIUM 8.6   PT/INR No results found for this basename: LABPROT, INR,  in the last 72 hours ABG No results found for this basename: PHART, PCO2, PO2, HCO3,  in the last 72 hours  Studies/Results: No results found.  Anti-infectives: Anti-infectives   Start     Dose/Rate Route Frequency Ordered Stop   11/15/13 1400  vancomycin (VANCOCIN) 1,250 mg in sodium chloride 0.9 % 250 mL IVPB     1,250 mg 166.7 mL/hr over 90 Minutes Intravenous Every 12 hours 11/15/13 1105     11/12/13 2200  vancomycin (VANCOCIN) 1,500 mg in sodium chloride 0.9 % 500 mL IVPB  Status:  Discontinued     1,500 mg 250 mL/hr over 120 Minutes Intravenous Every 12 hours 11/12/13 1437 11/15/13 1105   11/11/13 0000  vancomycin (VANCOCIN) IVPB 1000 mg/200 mL premix  Status:  Discontinued     1,000 mg 200 mL/hr over 60 Minutes Intravenous Every 12 hours 11/10/13 1823 11/12/13 1437   11/09/13 0600  vancomycin (VANCOCIN) IVPB 750 mg/150 ml premix  Status:  Discontinued     750 mg 150 mL/hr over 60 Minutes Intravenous  Every 12 hours 11/08/13 1656 11/08/13 1718   11/09/13 0600  vancomycin (VANCOCIN) 500 mg in sodium chloride 0.9 % 100 mL IVPB  Status:  Discontinued     500 mg 100 mL/hr over 60 Minutes Intravenous Every 12 hours 11/08/13 1718 11/10/13 1823   11/08/13 1730  vancomycin (VANCOCIN) IVPB 1000 mg/200 mL premix     1,000 mg 200 mL/hr over 60 Minutes Intravenous  Once 11/08/13 1655 11/08/13 1838   11/03/13 1600  piperacillin-tazobactam (ZOSYN) IVPB 3.375 g  Status:  Discontinued     3.375 g 12.5 mL/hr over 240 Minutes Intravenous Every 8 hours 11/03/13 1233 11/17/13 0820   11/03/13 0800  piperacillin-tazobactam (ZOSYN) IVPB 3.375 g  Status:  Discontinued    Comments:  First dose stat.   3.375 g 12.5 mL/hr over 240 Minutes Intravenous Every 8 hours 11/03/13 0734 11/03/13 1233      Assessment/Plan: s/p Procedure(s): EXPLORATORY LAPAROTOMY, DRAINAGE OF INTRA  ABDOMINAL ABSCESSES, MOBILIZATION OF SPLENIC FLEXURE, SIGMOID COLECTOMY WITH COLOSTOMY (N/A) Stop zosyn today.  F[place]ish vancomycin Hopefully home in am Wants to get radiation treatment before discharge Will need HHN WBC DOWN.  LOS: 14 days    Alexis [name] A. 11/17/2013

## 2013-11-18 ENCOUNTER — Ambulatory Visit: Payer: BC Managed Care – PPO

## 2013-11-18 ENCOUNTER — Encounter (HOSPITAL_COMMUNITY): Payer: Self-pay | Admitting: General Surgery

## 2013-11-18 ENCOUNTER — Ambulatory Visit
Admit: 2013-11-18 | Discharge: 2013-11-18 | Disposition: A | Discharge status: Home / Self Care | Payer: BC Managed Care – PPO | Attending: Radiation Oncology | Admitting: Radiation Oncology

## 2013-11-18 DIAGNOSIS — D649 Anemia, unspecified: Secondary | ICD-10-CM | POA: Clinically undetermined

## 2013-11-18 DIAGNOSIS — E43 Unspecified severe protein-calorie malnutrition: Secondary | ICD-10-CM | POA: Clinically undetermined

## 2013-11-18 DIAGNOSIS — K9189 Other postprocedural complications and disorders of digestive system: Secondary | ICD-10-CM | POA: Diagnosis not present

## 2013-11-18 DIAGNOSIS — R5381 Other malaise: Secondary | ICD-10-CM | POA: Clinically undetermined

## 2013-11-18 DIAGNOSIS — K567 Ileus, unspecified: Secondary | ICD-10-CM | POA: Diagnosis not present

## 2013-11-18 LAB — GLUCOSE, CAPILLARY
Glucose-Capillary: 113 mg/dL — ABNORMAL HIGH (ref 70–99)
Glucose-Capillary: 120 mg/dL — ABNORMAL HIGH (ref 70–99)

## 2013-11-18 MED ORDER — IBUPROFEN 200 MG PO TABS: ORAL_TABLET | ORAL | Status: DC

## 2013-11-18 MED ORDER — FERROUS SULFATE 325 (65 FE) MG PO TABS
325.0000 mg | ORAL_TABLET | Freq: Every day | ORAL | Status: DC
  Filled 2013-11-18: qty 1

## 2013-11-18 MED ORDER — OXYCODONE HCL 5 MG/5ML PO SOLN: 5.0000 mg | ORAL | Status: DC | PRN

## 2013-11-18 MED ORDER — FERROUS SULFATE 220 (44 FE) MG/5ML PO ELIX: 220.0000 mg | ORAL_SOLUTION | Freq: Every day | ORAL | Status: DC

## 2013-11-18 MED ORDER — FERROUS SULFATE 325 (65 FE) MG PO TABS: ORAL_TABLET | ORAL | Status: DC

## 2013-11-18 MED ORDER — ADULT MULTIVITAMIN W/MINERALS CH: ORAL_TABLET | ORAL | Status: DC

## 2013-11-18 NOTE — Discharge Instructions (Signed)
[facility]      [facility] °[phone] ° °OPEN ABDOMINAL SURGERY: POST OP INSTRUCTIONS ° °Always review your discharge instruction sheet given to you by the facility where your surgery was performed. ° °IF YOU HAVE DISABILITY OR FAMILY LEAVE FORMS, YOU MUST BRING THEM TO THE OFFICE FOR PROCESSING.  PLEASE DO NOT GIVE THEM TO YOUR DOCTOR. ° °1. A prescription for pain medication may be given to you upon discharge.  Take your pain medication as prescribed, if needed.  If narcotic pain medicine is not needed, then you may take acetaminophen (Tylenol) or ibuprofen (Advil) as needed. °2. Take your usually prescribed medications unless otherwise directed. °3. If you need a refill on your pain medication, please contact your pharmacy. They will contact our office to request authorization.  Prescriptions will not be filled after 5pm or on week-ends. °4. You should follow a light diet the first few days after arrival home, such as soup and crackers, pudding, etc.unless your doctor has advised otherwise. A high-fiber, low fat diet can be resumed as tolerated.   Be sure to include lots of fluids daily. Most patients will experience some swelling and bruising on the chest and neck area.  Ice packs will help.  Swelling and bruising can take several days to resolve °5. Most patients will experience some swelling and bruising in the area of the incision. Ice pack will help. Swelling and bruising can take several days to resolve..  °6. It is common to experience some constipation if taking pain medication after surgery.  Increasing fluid intake and taking a stool softener will usually help or prevent this problem from occurring.  A mild laxative (Milk of Magnesia or Miralax) should be taken according to package directions if there are no bowel movements after 48 hours. °7.  You may have steri-strips (small skin tapes) in place directly over the incision.  These strips should be left on the skin for 7-10 days.  If your  surgeon used skin glue on the incision, you may shower in 24 hours.  The glue will flake off over the next 2-3 weeks.  Any sutures or staples will be removed at the office during your follow-up visit. You may find that a light gauze bandage over your incision may keep your staples from being rubbed or pulled. You may shower and replace the bandage daily. °8. ACTIVITIES:  You may resume regular (light) daily activities beginning the next day--such as daily self-care, walking, climbing stairs--gradually increasing activities as tolerated.  You may have sexual intercourse when it is comfortable.  Refrain from any heavy lifting or straining until approved by your doctor. °a. You may drive when you no longer are taking prescription pain medication, you can comfortably wear a seatbelt, and you can safely maneuver your car and apply brakes °b. Return to Work: ___________________________________ °9. You should see your doctor in the office for a follow-up appointment approximately two weeks after your surgery.  Make sure that you call for this appointment within a day or two after you arrive home to insure a convenient appointment time. °OTHER INSTRUCTIONS:  °_____________________________________________________________ °_____________________________________________________________ ° °WHEN TO CALL YOUR DOCTOR: °1. Fever over 101.0 °2. Inability to urinate °3. Nausea and/or vomiting °4. Extreme swelling or bruising °5. Continued bleeding from incision. °6. Increased pain, redness, or drainage from the incision. °7. Difficulty swallowing or breathing °8. Muscle cramping or spasms. °9. Numbness or tingling in hands or feet or around lips. ° °The clinic staff is available to   answer your questions during regular business hours.  Please dont hesitate to call and ask to speak to one of the nurses if you have concerns.  For further questions, please visit [email or web address]  Diverticulitis A diverticulum is a  small pouch or sac on the colon. Diverticulosis is the presence of these diverticula on the colon. Diverticulitis is the irritation (inflammation) or infection of diverticula. CAUSES  The colon and its diverticula contain bacteria. If food particles block the tiny opening to a diverticulum, the bacteria inside can grow and cause an increase in pressure. This leads to infection and inflammation and is called diverticulitis. SYMPTOMS   Abdominal pain and tenderness. Usually, the pain is located on the left side of your abdomen. However, it could be located elsewhere.  Fever.  Bloating.  Feeling sick to your stomach (nausea).  Throwing up (vomiting).  Abnormal stools. DIAGNOSIS  Your caregiver will take a history and perform a physical exam. Since many things can cause abdominal pain, other tests may be necessary. Tests may include:  Blood tests.  Urine tests.  X-ray of the abdomen.  CT scan of the abdomen. Sometimes, surgery is needed to determine if diverticulitis or other conditions are causing your symptoms. TREATMENT  Most of the time, you can be treated without surgery. Treatment includes:  Resting the bowels by only having liquids for a few days. As you improve, you will need to eat a low-fiber diet.  Intravenous (IV) fluids if you are losing body fluids (dehydrated).  Antibiotic medicines that treat infections may be given.  Pain and nausea medicine, if needed.  Surgery if the inflamed diverticulum has burst. HOME CARE INSTRUCTIONS   Try a clear liquid diet (broth, tea, or water for as long as directed by your caregiver). You may then gradually begin a low-fiber diet as tolerated.  A low-fiber diet is a diet with less than 10 grams of fiber. Choose the foods below to reduce fiber in the diet:  White breads, cereals, rice, and pasta.  Cooked fruits and vegetables or soft fresh fruits and vegetables without the skin.  Ground or well-cooked tender beef, ham, veal,  lamb, pork, or poultry.  Eggs and seafood.  After your diverticulitis symptoms have improved, your caregiver may put you on a high-fiber diet. A high-fiber diet includes 14 grams of fiber for every 1000 calories consumed. For a standard 2000 calorie diet, you would need 28 grams of fiber. Follow these diet guidelines to help you increase the fiber in your diet. It is important to slowly increase the amount fiber in your diet to avoid gas, constipation, and bloating.  Choose whole-grain breads, cereals, pasta, and brown rice.  Choose fresh fruits and vegetables with the skin on. Do not overcook vegetables because the more vegetables are cooked, the more fiber is lost.  Choose more nuts, seeds, legumes, dried peas, beans, and lentils.  Look for food products that have greater than 3 grams of fiber per serving on the Nutrition Facts label.  Take all medicine as directed by your caregiver.  If your caregiver has given you a follow-up appointment, it is very important that you go. Not going could result in lasting (chronic) or permanent injury, pain, and disability. If there is any problem keeping the appointment, call to reschedule. SEEK MEDICAL CARE IF:   Your pain does not improve.  You have a hard time advancing your diet beyond clear liquids.  Your bowel movements do not return to normal. SEEK IMMEDIATE MEDICAL CARE IF:  Your pain becomes worse.  You have an oral temperature above 102 F (38.9 C), not controlled by medicine.  You have repeated vomiting.  You have bloody or black, tarry stools.  Symptoms that brought you to your caregiver become worse or are not getting better. MAKE SURE YOU:   Understand these instructions.  Will watch your condition.  Will get help right away if you are not doing well or get worse. Document Released: 05/25/2005 Document Revised: 11/07/2011 Document Reviewed: 09/20/2010 [facility] Patient Information 2014 [place].

## 2013-11-18 NOTE — Progress Notes (Signed)
[facility] Therapy Treatment Record Phone [phone]   Radiation Therapy was administered to Alexis [name] on: 11/18/2013  11:09 AM and was treatment # 12 out of a planned course of 25 treatments.

## 2013-11-18 NOTE — Progress Notes (Signed)
15 Days Post-Op  Subjective: Down for radiation rx.  Treatment 12 of 25 today.  She feels good and is eating a regular diet.  She looks the best I have seen her  Objective: Vital signs in last 24 hours: Temp:  [97.9 F (36.6 C)-98.4 F (36.9 C)] 98 F (36.7 C) (03/23 0840) Pulse Rate:  [73-90] 90 (03/23 0840) Resp:  [18-20] 18 (03/23 0840) BP: (91-101)/(47-58) 97/58 mmHg (03/23 0840) SpO2:  [93 %-97 %] 95 % (03/23 0840) Weight:  [50.803 kg (112 lb)] 50.803 kg (112 lb) (03/23 0355) Last BM Date: 11/17/13 (colostomy) +BM Afebrile, VSS No labs Last Hbg was 7.7 Intake/Output from previous day: 03/22 0701 - 03/23 0700 In: 720 [P.O.:120; I.V.:100; IV Piggyback:500] Out: 3101 [Urine:2350; Stool:751] Intake/Output this shift: Total I/O In: 120 [P.O.:120] Out: 200 [Urine:200]  General appearance: alert, cooperative and no distress GI: soft, non-tender; bowel sounds normal; no masses,  no organomegaly and open wound is OK, pink on the sides, still has the white fibrous tissue at the base.  Not as purulent as before.  Lab Results:   Recent Labs  11/16/13 0530 11/17/13 0445  WBC 11.2* 10.8*  HGB 7.4* 7.7*  HCT 22.5* 23.3*  PLT 694* 712*    BMET  Recent Labs  11/16/13 0530  NA 138  K 3.5*  CL 100  CO2 28  GLUCOSE 131*  BUN 13  CREATININE 0.48*  CALCIUM 8.6   PT/INR No results found for this basename: LABPROT, INR,  in the last 72 hours   Recent Labs Lab 11/14/13 0515 11/16/13 0530  AST 20 21  ALT 21 36*  ALKPHOS 189* 145*  BILITOT 0.4 0.2*  PROT 5.8* 5.6*  ALBUMIN 1.7* 1.7*     Lipase     Component Value Date/Time   LIPASE 8* 11/03/2013 0304     Studies/Results: No results found.  Medications: . feeding supplement (ENSURE COMPLETE)  237 mL Oral BID BM  . heparin subcutaneous  5,000 Units Subcutaneous 3 times per day  . multivitamin with minerals  1 tablet Oral Daily  . pantoprazole  40 mg Oral Daily  . sodium chloride  3 mL Intravenous Q12H     Assessment/Plan 1. Perforated sigmoid colon with multiple abscesses  Emergent exploratory laparotomy, drainage of intra-abdominal abscesses, mobilization of splenic flexure, sigmoid colectomy, colostomy---Dr. Zella Richer 11/03/13  2. Hx metastatic lung cancer with thoracic radiotherapy--Dr. [name] (treatment #7/25 yesterday)  3. COPD/Tobacco use hx  4. Hypertension  5. Anemia, this is new  5. Deconditioning and Malnutrition (prealbumin 6.7 and started on TNA 11/07/13)  7. Post op ileus  8. Leukocytosis/possible Healthcare associated pneumonia (15 days of Zosyn 9 days of vancomycin.)    Plan:  We have stopped her antibiotics, and plan to let her go home today.  Home health should be ready, we will check.  She is on MVI and I will add some FeSo4 to help with anemia.  Follow up with Dr. Zella Richer in 2 weeks,  Sooner if she has an issue.   LOS: 15 days    Alexis [name] 11/18/2013

## 2013-11-18 NOTE — Discharge Summary (Signed)
Physician Discharge Summary  Patient ID: Alexis [name] MRN: [id number] DOB/AGE: 09-03-1950 63 y.o.  Admit date: 11/03/2013 Discharge date: 11/18/2013  Admission Diagnoses:  Pneumoperitoneum with bowel perforation Hx metastatic right lung cancer with thoracic radiotherapy--Dr. [name] COPD/HX OF TOBACCO USE Hx of hypertension  Discharge Diagnoses:  1. Perforated sigmoid colon with multiple abscesses  2. Hx metastatic lung cancer with thoracic radiotherapy--Dr. [name] (treatment #12/25 -  11/18/13)  3. COPD/Tobacco use hx  4. Hypertension  5. Anemia, this is new  6. Deconditioning and Malnutrition (prealbumin 6.7 and started on TNA 11/07/13)  7. Post op ileus  8. Leukocytosis/possible Healthcare associated pneumonia (15 days of Zosyn 9 days of vancomycin.)  9.  Hx of hypertension, with post op hypotension, or low normal BP.   Active Problems:   Perforation of sigmoid colon   Ileus, postoperative   Pneumoperitoneum   Leukocytosis, unspecified   HCAP (healthcare-associated pneumonia)   Physical deconditioning   Severe protein-calorie malnutrition   Anemia   PROCEDURES: Emergent exploratory laparotomy, drainage of intra-abdominal abscesses, mobilization of splenic flexure, sigmoid colectomy, colostomy---Dr. Zella Richer 11/03/13   Continued radiation therapy during her post op course  Hospital Course: This is a 63 year old female who developed abdominal pain with nausea vomiting and a small amount of diarrhea 3 days ago. Her husband had similar symptoms. However her pain worsened and persisted. She got progressive abdominal distention. She presented to the emergency department for evaluation. She underwent a CT scan of the abdomen which demonstrated pneumoperitoneum as well as thickening in the sigmoid colon area suggesting perforated sigmoid colon. There is also an impending bowel obstruction with dilated loops of bowel. There multiple interloop abscesses. I was asked to see her at this time.  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 She's been treated with radiation therapy at this time. PET scan did not demonstrate any intra-abdominal metastasis.  She was seen and taken to the OR after ER evaluation.   Findings noted above. Pt tolerated the procedure well. Following AM she was still tachycardic, but stable.  They planned to send to the floor, and mobilize 11/04/13.  She continued to have allot of pain post op and developed a prolonged ileus.  Her ostomy looked necrotic post op, she had a prolonged ileus, she wans't really febrile, but she did have an elevated WBC.  On 3/13 we got a CT of the chest and abd/pelvis to look for sources for ongoing WBC elevation.  We also inserted a PICC line and started TNA.  Prealbumin was 6.7 on 3/11.  Despite negative CT's of the chest and abd/pelvis;  she continued to have elevated WBC's.  We ask Medicine to see and evaluate.  They were also unsure how to discern source of WBC.   We added Vancomycin to the Zosyn she was on; to broaden the coverage and possible hospital acquired pneumonia.  Her ileus slowly resolved, her ostomy remained stable, although a little worrisome.  Her WBC slowly came down.  Her open abdominal wound was also a source to be considered.  This is slowly improving, it still has some white fibrous, but less purulent stuff at the base of the wound. By 11/16/13 the wound was improving slowly, the open wound,sides look great;  she still has some white tissue covering the base of the wound.  She feels much better, she is taking PO's well.  She is off TNA. She had her 12th radiation treatment today and she was anxious to go  home.  We home health arranged and we have taught her husband how to do the dressing changes and care for the ostomy.  Her last preabumin was up to 13.8 on 11/10/13.  Her last WBC was 10.8 on 11/16/13.  She is anemic post op and we started her on MVI, and FE.  Her BP has been staying around the 90's, but no real  orthostatic changes noted. We increased her diet and plan discharge later today.  The PICC line will be removed and we will be sure home health is arranged. CBC    Component Value Date/Time   WBC 10.8* 11/17/2013 0445   RBC 2.42* 11/17/2013 0445   HGB 7.7* 11/17/2013 0445   HCT 23.3* 11/17/2013 0445   PLT 712* 11/17/2013 0445   MCV 96.3 11/17/2013 0445   MCH 31.8 11/17/2013 0445   MCHC 33.0 11/17/2013 0445   RDW 17.3* 11/17/2013 0445   LYMPHSABS 1.2 11/13/2013 0530   MONOABS 1.8* 11/13/2013 0530   EOSABS 0.2 11/13/2013 0530   BASOSABS 0.2* 11/13/2013 0530   CMP     Component Value Date/Time   NA 138 11/16/2013 0530   K 3.5* 11/16/2013 0530   CL 100 11/16/2013 0530   CO2 28 11/16/2013 0530   GLUCOSE 131* 11/16/2013 0530   BUN 13 11/16/2013 0530   CREATININE 0.48* 11/16/2013 0530   CALCIUM 8.6 11/16/2013 0530   PROT 5.6* 11/16/2013 0530   ALBUMIN 1.7* 11/16/2013 0530   AST 21 11/16/2013 0530   ALT 36* 11/16/2013 0530   ALKPHOS 145* 11/16/2013 0530   BILITOT 0.2* 11/16/2013 0530   GFRNONAA >90 11/16/2013 0530   GFRAA >90 11/16/2013 0530   Condition on d/c:  Improved               Disposition: 01-Home or Self Care       Future Appointments Provider Department Dept Phone   11/19/2013 10:45 AM [facility] [phone]   11/19/2013 10:45 AM [facility] [phone]   11/20/2013 10:45 AM [facility] [phone]   11/20/2013 10:45 AM [facility] [phone]   11/21/2013 10:45 AM [facility] [phone]   11/21/2013 10:45 AM [facility] [phone]   11/22/2013 10:45 AM [facility] [phone]   11/22/2013 10:45 AM [facility] [phone]   11/22/2013 11:00 AM Marye Round, [place] [phone]   11/25/2013 10:45 AM [facility] [phone]   11/25/2013 10:45 AM [facility] [phone]   11/26/2013 10:45 AM [facility] [phone]   11/26/2013 10:45 AM [facility] [phone]   11/27/2013 10:45 AM [facility] [phone]   11/27/2013 10:45 AM [facility] [phone]   11/28/2013 10:45 AM [facility] [phone]   11/28/2013 10:45 AM [facility] [phone]   11/29/2013 10:45 AM [facility] [phone]   11/29/2013 10:45 AM [facility] [phone]   12/02/2013 10:45 AM [facility] [phone]   12/02/2013 10:45 AM [facility] [phone]   12/03/2013 10:45 AM Marye Round, MD [facility] [phone]   Joint Appt [facility] [phone]   12/03/2013 10:45 AM [facility] [phone]   12/04/2013 9:50 AM Odis Hollingshead, MD [facility] [place] [phone]   12/05/2013 10:45 AM [facility] [phone]   12/06/2013 10:45 AM [facility] [phone]    12/06/2013 2:00 PM [facility] [phone]       Medication List         ALPRAZolam 0.5 MG tablet  Commonly known as:  XANAX  One four times a day as needed for nerves or pain     emollient cream  Commonly known as:  BIAFINE  Apply 1 application topically daily. Apply daily after rad tx     ferrous sulfate 325 (65 FE) MG tablet  1 daily with supper, you can buy over the counter.     fluconazole 100 MG tablet  Commonly known as:  DIFLUCAN  Take 1 tablet (100 mg total) by mouth daily. Take 2 tabs x 1, then 1 tab QD x 9 additional days.     HYDROcodone-acetaminophen 10-325 MG per tablet  Commonly known as:  NORCO  Take 1 tablet by mouth every 4 (four) hours as needed.     ibuprofen 200 MG tablet  Commonly known as:  ADVIL,MOTRIN  You can take 2-3 tablets every 6 hours as needed for pain.     multivitamin with minerals Tabs tablet  Take 1 daily with lunch and a meal.  Make sure it has iron for women.     omeprazole 20 MG capsule  Commonly known as:  PRILOSEC  Take 20 mg by mouth daily.     ondansetron 8 MG tablet  Commonly known as:  ZOFRAN  Take 1 tablet (8 mg total) by mouth every 8 (eight) hours as needed for nausea or vomiting.     oxyCODONE 5 MG/5ML solution  Commonly known as:  ROXICODONE  Take 5-10 mLs (5-10 mg total) by mouth every 4 (four) hours as needed for severe pain.       Follow-up Information   Follow up with [facility] (RN for wound and ostomy care)    Contact information:   [phone]      Follow up with Odis Hollingshead, MD On 12/04/2013. (For wound re-check.  Be at the office 9:30 for check in and your appointment is at 9:50 AM.)    Specialty:  General Surgery   Contact information:   [place] [address] [phone]       Follow up with [name], MD. (Call and let him know about your surgery so he can follow up as needed also.)    Specialty:   Family Medicine   Contact information:   [address] [place] [address] [phone]       Follow up with Marye Round, MD. (Follow up with your radiation doctor and your primary oncologist as needed also.)    Specialty:  Radiation Oncology   Contact information:   [place] [employer]  [place] [address] [phone]       Signed: Earnstine Regal 11/18/2013, 3:46 PM

## 2013-11-19 ENCOUNTER — Telehealth: Payer: Self-pay | Admitting: *Deleted

## 2013-11-19 ENCOUNTER — Ambulatory Visit: Payer: BC Managed Care – PPO

## 2013-11-19 ENCOUNTER — Other Ambulatory Visit: Payer: Self-pay | Admitting: Radiation Therapy

## 2013-11-19 ENCOUNTER — Ambulatory Visit
Admit: 2013-11-19 | Discharge: 2013-11-19 | Disposition: A | Discharge status: Home / Self Care | Payer: BC Managed Care – PPO | Attending: Radiation Oncology | Admitting: Radiation Oncology

## 2013-11-19 DIAGNOSIS — C7931 Secondary malignant neoplasm of brain: Secondary | ICD-10-CM

## 2013-11-19 DIAGNOSIS — C7949 Secondary malignant neoplasm of other parts of nervous system: Principal | ICD-10-CM

## 2013-11-19 NOTE — Telephone Encounter (Signed)
Dr.moody will see pateitn tomorrow after tx 2:34 PM

## 2013-11-19 NOTE — Telephone Encounter (Signed)
Called patient home,spoke with husband explained that we can see the patient after her rad tx, that I couldn't call in diflucan Rx that was written in the hospital upon d/c yesterday, and either Dr.Zmoody or on call MD can write rx for her thrush, spouse thanked me for calling 1:46 PM

## 2013-11-19 NOTE — Telephone Encounter (Signed)
Voice message left stating patient wasn't given diflucan rx when she left hospital, will have Dr.moody see patient after tx

## 2013-11-19 NOTE — Telephone Encounter (Signed)
Nurse from [facility] called  Seeing patient at home, noted she has thrush and has been on diflucan asked if Dr.Moody could write a rx for mmw for the patient, informed Debbie,RN that MD was off today but would e-mail him and can address this tomorrow when patient comes in fro her rad tx, patient uses [facility] on [address],  1:19 PM

## 2013-11-20 ENCOUNTER — Ambulatory Visit: Payer: BC Managed Care – PPO

## 2013-11-20 ENCOUNTER — Ambulatory Visit
Admission: RE | Admit: 2013-11-20 | Discharge: 2013-11-20 | Disposition: A | Discharge status: Home / Self Care | Payer: BC Managed Care – PPO | Source: Ambulatory Visit | Attending: Radiation Oncology | Admitting: Radiation Oncology

## 2013-11-20 ENCOUNTER — Ambulatory Visit: Payer: BC Managed Care – PPO | Admitting: Radiation Oncology

## 2013-11-20 ENCOUNTER — Encounter: Payer: Self-pay | Admitting: Radiation Oncology

## 2013-11-20 ENCOUNTER — Ambulatory Visit
Admit: 2013-11-20 | Discharge: 2013-11-20 | Disposition: A | Discharge status: Home / Self Care | Payer: BC Managed Care – PPO | Attending: Radiation Oncology | Admitting: Radiation Oncology

## 2013-11-20 VITALS — BP 119/70 | HR 88 | Temp 98.0°F | Ht 64.0 in | Wt 110.6 lb

## 2013-11-20 DIAGNOSIS — C3410 Malignant neoplasm of upper lobe, unspecified bronchus or lung: Secondary | ICD-10-CM

## 2013-11-20 MED ORDER — ALPRAZOLAM 0.5 MG PO TABS: ORAL_TABLET | ORAL | Status: DC

## 2013-11-20 MED ORDER — MAGIC MOUTHWASH: 5.0000 mL | ORAL | Status: DC | PRN

## 2013-11-20 MED ORDER — HYDROCODONE-ACETAMINOPHEN 10-325 MG PO TABS
1.0000 | ORAL_TABLET | ORAL | Status: DC | PRN
Start: 2013-11-20 — End: 2013-12-04

## 2013-11-20 NOTE — Progress Notes (Signed)
The patient was seen today. She needed refills on Xanax and her pain medication. She was given a refill for Percocet which he feels works better than oxycodone. She also was given Magic mouthwash for a sore throat.

## 2013-11-20 NOTE — Progress Notes (Signed)
Alexis [name] has received 10/21 fractions to her right chest.  She c/o sore throat of a level 5 on a scale of 0-10.    She now has Diflucan for thrush, and her mouth is clear except for redness and rougher texture of her tongue.

## 2013-11-21 ENCOUNTER — Ambulatory Visit
Admit: 2013-11-21 | Discharge: 2013-11-21 | Disposition: A | Discharge status: Home / Self Care | Payer: BC Managed Care – PPO | Attending: Radiation Oncology | Admitting: Radiation Oncology

## 2013-11-21 ENCOUNTER — Telehealth: Payer: Self-pay | Admitting: *Deleted

## 2013-11-21 ENCOUNTER — Encounter: Payer: Self-pay | Admitting: *Deleted

## 2013-11-21 ENCOUNTER — Ambulatory Visit: Payer: BC Managed Care – PPO

## 2013-11-21 NOTE — Telephone Encounter (Signed)
[facility]  [phone], okay per Dr.Moody to switch MMW to Dukes formula ,spoke with pharmacist Arbie Cookey, she will make rx and call the patient,thanked MD   9:05 AM

## 2013-11-22 ENCOUNTER — Ambulatory Visit
Admit: 2013-11-22 | Discharge: 2013-11-22 | Disposition: A | Discharge status: Home / Self Care | Payer: BC Managed Care – PPO | Attending: Radiation Oncology | Admitting: Radiation Oncology

## 2013-11-22 ENCOUNTER — Ambulatory Visit: Payer: BC Managed Care – PPO

## 2013-11-22 ENCOUNTER — Ambulatory Visit
Admission: RE | Admit: 2013-11-22 | Discharge: 2013-11-22 | Disposition: A | Discharge status: Home / Self Care | Payer: BC Managed Care – PPO | Source: Ambulatory Visit | Attending: Radiation Oncology | Admitting: Radiation Oncology

## 2013-11-22 ENCOUNTER — Encounter: Payer: Self-pay | Admitting: Radiation Oncology

## 2013-11-22 VITALS — BP 89/52 | HR 92 | Temp 97.9°F | Resp 20 | Wt 111.3 lb

## 2013-11-22 DIAGNOSIS — C3410 Malignant neoplasm of upper lobe, unspecified bronchus or lung: Secondary | ICD-10-CM

## 2013-11-22 NOTE — Progress Notes (Signed)
Weekly rad txs chest 16 completed, patient still weak gaining weight, no pain, no nuasea, no coughing, no difficulty swallowing food, mild heartburn, thrush is getting better, dukes solution is helped a lot, diflucan taking also 11:22 AM

## 2013-11-22 NOTE — Progress Notes (Signed)
   Department of Radiation Oncology  Phone:  [phone] Fax:        [phone]  Weekly Treatment Note    Name: Alexis [name] Date: 11/22/2013 MRN: [id number] DOB: 1951/02/01   Current dose: 32 Gy  Current fraction: 16   MEDICATIONS: Current Outpatient Prescriptions  Medication Sig Dispense Refill  . ALPRAZolam (XANAX) 0.5 MG tablet One four times a day as needed for nerves or pain  120 tablet  0  . Alum & Mag Hydroxide-Simeth (MAGIC MOUTHWASH) SOLN Take 5 mLs by mouth every 4 (four) hours as needed for mouth pain.  500 mL  1  . emollient (BIAFINE) cream Apply 1 application topically daily. Apply daily after rad tx      . ferrous sulfate 325 (65 FE) MG tablet 1 daily with supper, you can buy over the counter.    3  . fluconazole (DIFLUCAN) 100 MG tablet Take 1 tablet (100 mg total) by mouth daily. Take 2 tabs x 1, then 1 tab QD x 9 additional days.  11 tablet  0  . HYDROcodone-acetaminophen (NORCO) 10-325 MG per tablet Take 1 tablet by mouth every 4 (four) hours as needed.  60 tablet  0  . ibuprofen (ADVIL,MOTRIN) 200 MG tablet You can take 2-3 tablets every 6 hours as needed for pain.  30 tablet  0  . Multiple Vitamin (MULTIVITAMIN WITH MINERALS) TABS tablet Take 1 daily with lunch and a meal.  Make sure it has iron for women.      Marland Kitchen omeprazole (PRILOSEC) 20 MG capsule Take 20 mg by mouth daily.      . ondansetron (ZOFRAN) 8 MG tablet Take 1 tablet (8 mg total) by mouth every 8 (eight) hours as needed for nausea or vomiting.  30 tablet  0  . oxyCODONE (ROXICODONE) 5 MG/5ML solution Take 5-10 mLs (5-10 mg total) by mouth every 4 (four) hours as needed for severe pain.  250 mL  0   No current facility-administered medications for this encounter.     ALLERGIES: Codeine   LABORATORY DATA:  Lab Results  Component Value Date   WBC 10.8* 11/17/2013   HGB 7.7* 11/17/2013   HCT 23.3* 11/17/2013   MCV 96.3 11/17/2013   PLT 712* 11/17/2013   Lab Results  Component Value Date   NA 138 11/16/2013   K 3.5* 11/16/2013   CL 100 11/16/2013   CO2 28 11/16/2013   Lab Results  Component Value Date   ALT 36* 11/16/2013   AST 21 11/16/2013   ALKPHOS 145* 11/16/2013   BILITOT 0.2* 11/16/2013     NARRATIVE: Alexis [name] was seen today for weekly treatment management. The chart was checked and the patient's films were reviewed. The patient is doing well. She indicates that she slowly is getting stronger. Improving energy level. She notes that her chest pain remains markedly improved. No significant esophagitis.  PHYSICAL EXAMINATION: weight is 111 lb 4.8 oz (50.485 kg). Her oral temperature is 97.9 F (36.6 C). Her blood pressure is 89/52 and her pulse is 92. Her respiration is 20.        ASSESSMENT: The patient is doing satisfactorily with treatment.  PLAN: We will continue with the patient's radiation treatment as planned.

## 2013-11-25 ENCOUNTER — Telehealth: Payer: Self-pay | Admitting: *Deleted

## 2013-11-25 ENCOUNTER — Ambulatory Visit: Payer: BC Managed Care – PPO

## 2013-11-25 ENCOUNTER — Ambulatory Visit
Admit: 2013-11-25 | Discharge: 2013-11-25 | Disposition: A | Discharge status: Home / Self Care | Payer: BC Managed Care – PPO | Attending: Radiation Oncology | Admitting: Radiation Oncology

## 2013-11-25 NOTE — Telephone Encounter (Signed)
Called left vm message regarding appt for [facility] 11/28/13.  I left my name and phone number as well.

## 2013-11-25 NOTE — Telephone Encounter (Signed)
Pt called to reschedule.  I rescheduled appt with Dr. Julien Nordmann on 12/02/13 at 11:30 labs and 11:45

## 2013-11-26 ENCOUNTER — Ambulatory Visit: Payer: BC Managed Care – PPO

## 2013-11-26 ENCOUNTER — Telehealth: Payer: Self-pay | Admitting: *Deleted

## 2013-11-26 ENCOUNTER — Ambulatory Visit
Admit: 2013-11-26 | Discharge: 2013-11-26 | Disposition: A | Discharge status: Home / Self Care | Payer: BC Managed Care – PPO | Attending: Radiation Oncology | Admitting: Radiation Oncology

## 2013-11-26 NOTE — Telephone Encounter (Signed)
Pt called regarding appt time.  I called back to clarify appt on Monday April 6th.  She verbalized understanding.

## 2013-11-27 ENCOUNTER — Ambulatory Visit
Admit: 2013-11-27 | Discharge: 2013-11-27 | Disposition: A | Discharge status: Home / Self Care | Payer: BC Managed Care – PPO | Attending: Radiation Oncology | Admitting: Radiation Oncology

## 2013-11-27 ENCOUNTER — Ambulatory Visit
Admission: RE | Admit: 2013-11-27 | Discharge: 2013-11-27 | Disposition: A | Discharge status: Home / Self Care | Payer: BC Managed Care – PPO | Source: Ambulatory Visit | Attending: Radiation Oncology | Admitting: Radiation Oncology

## 2013-11-27 ENCOUNTER — Encounter: Payer: Self-pay | Admitting: Radiation Oncology

## 2013-11-27 ENCOUNTER — Ambulatory Visit: Payer: BC Managed Care – PPO

## 2013-11-27 VITALS — BP 118/51 | HR 100 | Temp 98.2°F | Resp 20 | Wt 110.3 lb

## 2013-11-27 DIAGNOSIS — C3410 Malignant neoplasm of upper lobe, unspecified bronchus or lung: Secondary | ICD-10-CM

## 2013-11-27 NOTE — Progress Notes (Addendum)
Weekly rad txs., 15 tx completed,right chest, biafine cream bid,  no skin changes, no nausea, sob, fair appetite, energy level better, completed diflucan for thrush, still using dukes mouth solution, taste is getting better,  Energy getting better each day 11:08 AM

## 2013-11-27 NOTE — Progress Notes (Signed)
   Weekly Management Note:  Outpatient Current Dose:  38 Gy  Fraction: 19  Narrative:  The patient presents for routine under treatment assessment.  CBCT/MVCT images/Port film x-rays were reviewed.  The chart was checked. No new complaints, doing well.  Eating small meals, no esophagitis,  Thrush resolved with diflucan.  Physical Findings:  weight is 110 lb 4.8 oz (50.032 kg). Her oral temperature is 98.2 F (36.8 C). Her blood pressure is 118/51 and her pulse is 100. Her respiration is 20 and oxygen saturation is 98%.  NAD, no skin irritation in tx fields. No thrush in mouth  CBC    Component Value Date/Time   WBC 10.8* 11/17/2013 0445   RBC 2.42* 11/17/2013 0445   HGB 7.7* 11/17/2013 0445   HCT 23.3* 11/17/2013 0445   PLT 712* 11/17/2013 0445   MCV 96.3 11/17/2013 0445   MCH 31.8 11/17/2013 0445   MCHC 33.0 11/17/2013 0445   RDW 17.3* 11/17/2013 0445   LYMPHSABS 1.2 11/13/2013 0530   MONOABS 1.8* 11/13/2013 0530   EOSABS 0.2 11/13/2013 0530   BASOSABS 0.2* 11/13/2013 0530     CMP     Component Value Date/Time   NA 138 11/16/2013 0530   K 3.5* 11/16/2013 0530   CL 100 11/16/2013 0530   CO2 28 11/16/2013 0530   GLUCOSE 131* 11/16/2013 0530   BUN 13 11/16/2013 0530   CREATININE 0.48* 11/16/2013 0530   CALCIUM 8.6 11/16/2013 0530   PROT 5.6* 11/16/2013 0530   ALBUMIN 1.7* 11/16/2013 0530   AST 21 11/16/2013 0530   ALT 36* 11/16/2013 0530   ALKPHOS 145* 11/16/2013 0530   BILITOT 0.2* 11/16/2013 0530   GFRNONAA >90 11/16/2013 0530   GFRAA >90 11/16/2013 0530     Impression:  The patient is tolerating radiotherapy.   Plan:  Continue radiotherapy as planned.   -----------------------------------  Eppie Gibson, MD

## 2013-11-28 ENCOUNTER — Ambulatory Visit: Payer: BC Managed Care – PPO

## 2013-11-28 ENCOUNTER — Ambulatory Visit: Payer: BC Managed Care – PPO | Admitting: Internal Medicine

## 2013-11-28 ENCOUNTER — Ambulatory Visit
Admit: 2013-11-28 | Discharge: 2013-11-28 | Disposition: A | Discharge status: Home / Self Care | Payer: BC Managed Care – PPO | Attending: Radiation Oncology | Admitting: Radiation Oncology

## 2013-11-29 ENCOUNTER — Other Ambulatory Visit: Payer: Self-pay | Admitting: *Deleted

## 2013-11-29 ENCOUNTER — Ambulatory Visit: Payer: BC Managed Care – PPO

## 2013-11-29 ENCOUNTER — Ambulatory Visit
Admit: 2013-11-29 | Discharge: 2013-11-29 | Disposition: A | Discharge status: Home / Self Care | Payer: BC Managed Care – PPO | Attending: Radiation Oncology | Admitting: Radiation Oncology

## 2013-11-29 DIAGNOSIS — C7949 Secondary malignant neoplasm of other parts of nervous system: Principal | ICD-10-CM

## 2013-11-29 DIAGNOSIS — C7931 Secondary malignant neoplasm of brain: Secondary | ICD-10-CM

## 2013-12-02 ENCOUNTER — Other Ambulatory Visit: Payer: BC Managed Care – PPO

## 2013-12-02 ENCOUNTER — Ambulatory Visit: Payer: BC Managed Care – PPO

## 2013-12-02 ENCOUNTER — Ambulatory Visit
Admit: 2013-12-02 | Discharge: 2013-12-02 | Disposition: A | Discharge status: Home / Self Care | Payer: BC Managed Care – PPO | Attending: Radiation Oncology | Admitting: Radiation Oncology

## 2013-12-02 ENCOUNTER — Encounter (INDEPENDENT_AMBULATORY_CARE_PROVIDER_SITE_OTHER): Payer: BC Managed Care – PPO | Admitting: General Surgery

## 2013-12-02 ENCOUNTER — Ambulatory Visit: Payer: BC Managed Care – PPO | Admitting: Internal Medicine

## 2013-12-02 ENCOUNTER — Other Ambulatory Visit: Payer: Self-pay | Admitting: Internal Medicine

## 2013-12-02 DIAGNOSIS — C3410 Malignant neoplasm of upper lobe, unspecified bronchus or lung: Secondary | ICD-10-CM

## 2013-12-03 ENCOUNTER — Telehealth: Payer: Self-pay | Admitting: Internal Medicine

## 2013-12-03 ENCOUNTER — Ambulatory Visit: Payer: BC Managed Care – PPO

## 2013-12-03 ENCOUNTER — Ambulatory Visit
Admit: 2013-12-03 | Discharge: 2013-12-03 | Disposition: A | Discharge status: Home / Self Care | Payer: BC Managed Care – PPO | Attending: Radiation Oncology | Admitting: Radiation Oncology

## 2013-12-03 NOTE — Telephone Encounter (Signed)
pt stopped by today after xrt re appt w/MM. per pt she believed the appt to be today not yesterday. this is a new pt appt scheduled by dana. message dana and copied to HIM/MM/desk nurse re contacting pt w/new appt. pt aware she will hear from dana.

## 2013-12-04 ENCOUNTER — Ambulatory Visit: Payer: BC Managed Care – PPO

## 2013-12-04 ENCOUNTER — Ambulatory Visit: Payer: BC Managed Care – PPO | Admitting: Radiation Oncology

## 2013-12-04 ENCOUNTER — Ambulatory Visit (INDEPENDENT_AMBULATORY_CARE_PROVIDER_SITE_OTHER): Payer: BC Managed Care – PPO | Admitting: General Surgery

## 2013-12-04 ENCOUNTER — Ambulatory Visit
Admission: RE | Admit: 2013-12-04 | Discharge: 2013-12-04 | Disposition: A | Discharge status: Home / Self Care | Payer: BC Managed Care – PPO | Source: Ambulatory Visit | Attending: Radiation Oncology | Admitting: Radiation Oncology

## 2013-12-04 VITALS — BP 128/72 | HR 72 | Temp 97.0°F | Resp 14 | Ht 64.0 in | Wt 106.0 lb

## 2013-12-04 DIAGNOSIS — Z4889 Encounter for other specified surgical aftercare: Secondary | ICD-10-CM

## 2013-12-04 MED ORDER — HYDROCODONE-ACETAMINOPHEN 10-325 MG PO TABS: 1.0000 | ORAL_TABLET | ORAL | Status: DC | PRN

## 2013-12-04 NOTE — Patient Instructions (Signed)
Diet in moderation as discussed. Continue light activities. Continue current wound care.

## 2013-12-04 NOTE — Progress Notes (Signed)
Procedure:  Emergency exploratory laparotomy with drainage of intra-abdominal abscesses and Hartmann procedure for perforated sigmoid diverticulitis  Date:  11/03/2013  Pathology:  Diverticulitis  History:  She is here for her first post hospitalization visit. She is slowly regaining her strength. She does not have much of an appetite. Colostomy is working well. Her husband is packing the wound with saline gauze followed by dry dressing. She's getting toward the end of her radiation treatments.  Exam: General- Is in NAD. Abdomen-stool coming out of the left upper quadrant colostomy. Midline incision is open and is healing with granulation tissue present.  Assessment:  She is progressing well given all that she has been through.  Plan:  Continue light activities. Continue current wound care. Return visit 3 weeks.

## 2013-12-05 ENCOUNTER — Ambulatory Visit
Admission: RE | Admit: 2013-12-05 | Discharge: 2013-12-05 | Disposition: A | Discharge status: Home / Self Care | Payer: BC Managed Care – PPO | Source: Ambulatory Visit | Attending: Radiation Oncology | Admitting: Radiation Oncology

## 2013-12-05 ENCOUNTER — Ambulatory Visit: Payer: BC Managed Care – PPO

## 2013-12-05 ENCOUNTER — Encounter: Payer: Self-pay | Admitting: Radiation Oncology

## 2013-12-05 ENCOUNTER — Ambulatory Visit: Admission: RE | Admit: 2013-12-05 | Payer: BC Managed Care – PPO | Source: Ambulatory Visit

## 2013-12-05 VITALS — BP 129/51 | HR 100 | Temp 97.7°F | Resp 20 | Wt 106.5 lb

## 2013-12-05 DIAGNOSIS — C3410 Malignant neoplasm of upper lobe, unspecified bronchus or lung: Secondary | ICD-10-CM

## 2013-12-05 NOTE — Progress Notes (Signed)
  Radiation Oncology         [phone] ________________________________  Name: Alexis [name]  MRN: [id number]  Date: 12/05/2013  DOB: Nov 19, 1950  Weekly Radiation Therapy Management  Current Dose: 50 Gy     Planned Dose:  50 Gy  Narrative . . . . . . . . The patient presents for routine under treatment assessment.                                   No coughing, no pain, just nausea gaggs with some foods, Low appetite, Still weakness but getting better, energy getting better, Colostomy working well, Saw DrMarland Kitchen Rosenbower yesterday , good report                                 Set-up films were reviewed.                                 The chart was checked. Physical Findings. . .  weight is 106 lb 8 oz (48.308 kg). Her oral temperature is 97.7 F (36.5 C). Her blood pressure is 129/51 and her pulse is 100. Her respiration is 20 and oxygen saturation is 100%. . Weight essentially stable.  No significant changes. Impression . . . . . . . The patient is tolerating radiation. Plan . . . . . . . . . . . . Continue treatment as planned.  1 month f/u appt card given  ________________________________  Sheral Apley. Tammi Klippel, M.D.

## 2013-12-05 NOTE — Progress Notes (Addendum)
Weekly rad txs 25/25 rt chest,  Completed ;  No coughing, no skin changes, uses biafine cream bid,  no pain, just nausea gaggs with some foods,  Low appetite,  Still weakness but getting better, energy getting better,  Colostomy working well,  Saw DrMarland Kitchen Rosenbower yesterday , good report 1 month f/u appt card given 11:05 AM

## 2013-12-06 ENCOUNTER — Ambulatory Visit: Payer: BC Managed Care – PPO

## 2013-12-10 ENCOUNTER — Other Ambulatory Visit: Payer: Self-pay | Admitting: Medical Oncology

## 2013-12-10 DIAGNOSIS — C7931 Secondary malignant neoplasm of brain: Secondary | ICD-10-CM

## 2013-12-10 DIAGNOSIS — C7949 Secondary malignant neoplasm of other parts of nervous system: Principal | ICD-10-CM

## 2013-12-11 ENCOUNTER — Encounter: Payer: Self-pay | Admitting: Internal Medicine

## 2013-12-11 ENCOUNTER — Telehealth: Payer: Self-pay | Admitting: *Deleted

## 2013-12-11 ENCOUNTER — Telehealth: Payer: Self-pay | Admitting: Internal Medicine

## 2013-12-11 ENCOUNTER — Ambulatory Visit (HOSPITAL_BASED_OUTPATIENT_CLINIC_OR_DEPARTMENT_OTHER): Payer: BC Managed Care – PPO | Admitting: Internal Medicine

## 2013-12-11 ENCOUNTER — Other Ambulatory Visit (HOSPITAL_COMMUNITY)
Admission: RE | Admit: 2013-12-11 | Discharge: 2013-12-11 | Disposition: A | Discharge status: Home / Self Care | Payer: BC Managed Care – PPO | Source: Ambulatory Visit | Attending: Internal Medicine | Admitting: Internal Medicine

## 2013-12-11 ENCOUNTER — Ambulatory Visit (HOSPITAL_BASED_OUTPATIENT_CLINIC_OR_DEPARTMENT_OTHER): Payer: BC Managed Care – PPO

## 2013-12-11 ENCOUNTER — Other Ambulatory Visit (HOSPITAL_BASED_OUTPATIENT_CLINIC_OR_DEPARTMENT_OTHER): Payer: BC Managed Care – PPO

## 2013-12-11 VITALS — BP 116/61 | HR 83 | Temp 98.2°F | Resp 18 | Ht 64.0 in | Wt 103.9 lb

## 2013-12-11 DIAGNOSIS — C3410 Malignant neoplasm of upper lobe, unspecified bronchus or lung: Secondary | ICD-10-CM

## 2013-12-11 DIAGNOSIS — Z87891 Personal history of nicotine dependence: Secondary | ICD-10-CM

## 2013-12-11 DIAGNOSIS — Z803 Family history of malignant neoplasm of breast: Secondary | ICD-10-CM

## 2013-12-11 DIAGNOSIS — C7931 Secondary malignant neoplasm of brain: Secondary | ICD-10-CM

## 2013-12-11 DIAGNOSIS — Z8673 Personal history of transient ischemic attack (TIA), and cerebral infarction without residual deficits: Secondary | ICD-10-CM

## 2013-12-11 DIAGNOSIS — C7949 Secondary malignant neoplasm of other parts of nervous system: Principal | ICD-10-CM

## 2013-12-11 DIAGNOSIS — Z801 Family history of malignant neoplasm of trachea, bronchus and lung: Secondary | ICD-10-CM

## 2013-12-11 DIAGNOSIS — K631 Perforation of intestine (nontraumatic): Secondary | ICD-10-CM

## 2013-12-11 LAB — COMPREHENSIVE METABOLIC PANEL (CC13)
ALT: <6 U/L (ref 0–55)
AST: 8 U/L (ref 5–34)
Albumin: 2.5 g/dL — ABNORMAL LOW (ref 3.5–5.0)
Alkaline Phosphatase: 81 U/L (ref 40–150)
Anion Gap: 9 mEq/L (ref 3–11)
BUN: 11.5 mg/dL (ref 7.0–26.0)
CO2: 31 mEq/L — ABNORMAL HIGH (ref 22–29)
Calcium: 8.8 mg/dL (ref 8.4–10.4)
Chloride: 103 mEq/L (ref 98–109)
Creatinine: 0.6 mg/dL (ref 0.6–1.1)
Glucose: 109 mg/dl (ref 70–140)
Potassium: 2.9 mEq/L — CL (ref 3.5–5.1)
Sodium: 143 mEq/L (ref 136–145)
Total Bilirubin: 0.25 mg/dL (ref 0.20–1.20)
Total Protein: 6 g/dL — ABNORMAL LOW (ref 6.4–8.3)

## 2013-12-11 LAB — CBC WITH DIFFERENTIAL/PLATELET
BASO%: 0.6 % (ref 0.0–2.0)
Basophils Absolute: 0 10*3/uL (ref 0.0–0.1)
EOS%: 1.1 % (ref 0.0–7.0)
Eosinophils Absolute: 0.1 10*3/uL (ref 0.0–0.5)
HCT: 30.6 % — ABNORMAL LOW (ref 34.8–46.6)
HGB: 10 g/dL — ABNORMAL LOW (ref 11.6–15.9)
LYMPH%: 11.7 % — ABNORMAL LOW (ref 14.0–49.7)
MCH: 30.8 pg (ref 25.1–34.0)
MCHC: 32.7 g/dL (ref 31.5–36.0)
MCV: 94.3 fL (ref 79.5–101.0)
MONO#: 0.8 10*3/uL (ref 0.1–0.9)
MONO%: 9.8 % (ref 0.0–14.0)
NEUT#: 6.1 10*3/uL (ref 1.5–6.5)
NEUT%: 76.8 % (ref 38.4–76.8)
Platelets: 433 10*3/uL — ABNORMAL HIGH (ref 145–400)
RBC: 3.25 10*6/uL — ABNORMAL LOW (ref 3.70–5.45)
RDW: 16.8 % — ABNORMAL HIGH (ref 11.2–14.5)
WBC: 8 10*3/uL (ref 3.9–10.3)
lymph#: 0.9 10*3/uL (ref 0.9–3.3)

## 2013-12-11 MED ORDER — POTASSIUM CHLORIDE CRYS ER 20 MEQ PO TBCR: 40.0000 meq | EXTENDED_RELEASE_TABLET | ORAL | Status: DC

## 2013-12-11 NOTE — Telephone Encounter (Signed)
K 2.9. Per Dr Vista Mink pt needs 12meq x 7 days.  Called home #, no answer, unable to leave message.  Called rx into pharmacy.  Will leave msg for RN tomorrow to call and f/u.  SLJ

## 2013-12-11 NOTE — Progress Notes (Signed)
[place] [phone]   [phone]  CONSULT NOTE  REFERRING PHYSICIAN: Dr. [name]  REASON FOR CONSULTATION:  63 years old white female recently diagnosed with lung cancer  HPI Alexis [name] is a 63 y.o. female with a past medical history significant for cervical cancer treated more than 10 years ago with no evidence for recurrence, history of anemia and recent sigmoid colon perforation status post resection and colostomy. The patient mentions that in December of 2014 she has been complaining of right subscapular pain and hemoptysis. She was seen by her primary care physician and chest x-ray followed by CT scan of the chest was performed on 08/19/2014 and it showed 2.4 x 3.4 x 4.1 CM spiculated soft tissue masses identified in the posterior right apex with patchy ill defined tree in Bud opacity extended posteriorly and inferiorly to the major fissure. The patient was referred to Dr. Melvyn Novas and on 08/30/2013 she underwent negative flexible fiberoptic bronchoscopy with transbronchial biopsy of the right upper lobe and bronchoalveolar lavage. The final pathology (Accession: [id number]) showed poorly differentiated non-small cell carcinoma. Immunohistochemistry is performed and the carcinoma is positive with cytokeratin 7, thyroid transcription factor-1 (TTF-1) and Napsin A. The tumor is negative with cytokeratin 5/6, cytokeratin 903 and p63. The immunophenotype is consistent with adenocarcinoma.  A PET scan was performed on 09/09/2013 and it showed right upper lobe lung mass with a maximum diameter measuring 3.9 CM with SUV max of 16.7. There is surrounding groundglass attenuation and interstitial thickening questionable for postbiopsy hemorrhage but similar appearance may be seen with lymphangitic spread of tumor. There was no evidence for distant metastatic disease and no involvement of the hilar or mediastinal lymph nodes. The patient was referred to Dr.  Roxan Hockey for consideration of surgical resection, but MRI of the brain on 10/01/2013 showed 0.8 x 0.8 x 0.9 CM ring-enhancing lesion in the right parietal lobe with surrounding edema most concerning for a local metastasis. There was also 0.8 CM lesion in the left parietal skull concerning for metastasis. Bone scan was performed on 10/07/2013 and that was negative for osseous metastatic disease. The patient was referred to Dr. [name] and on 10/16/2013 she underwent a stereotactic radiotherapy to the solitary right parietal brain lesion. She was also started on curative radiotherapy to the right upper lobe lung mass under the care of Dr. [name] and this was completed on 12/05/2013.  During her radiotherapy to the lung mass, the patient was diagnosed with perforated sigmoid colon with multiple intra-abdominal abscesses. She underwent emergency exploratory laparotomy with drainage of the intra-abdominal abscesses, mobilization of the splenic flexure, sigmoid colectomy and colostomy under the care of Dr. Zella Richer on 11/03/2013. The patient is feeling fine today with no specific complaints except for generalized weakness as well as sore throat. She lost around 75 pounds in the last 6 months. She denied having any significant chest pain, shortness of breath, cough or hemoptysis. She denied having any visual changes but has occasional headache. She is here today for evaluation and discussion of her systemic treatment options. Family history significant for her mother with COPD and breast cancer and father with lung cancer. The patient is married and has one daughter. She was accompanied today by her husband Ariel. She used to work in a [employer]. She has a history of smoking one half pack per day for around 40 years but quit in December of 2014. She has no history of alcohol or drug abuse. HPI  Past Medical History  Diagnosis Date  . Cancer     lung, hx cervicle  . Ileus, postoperative 11/18/2013  .  Physical deconditioning 11/18/2013  . Severe protein-calorie malnutrition 11/18/2013  . Anemia 11/18/2013    Past Surgical History  Procedure Laterality Date  . Video bronchoscopy Bilateral 08/30/2013    Procedure: VIDEO BRONCHOSCOPY WITH FLUORO;  Surgeon: Tanda Rockers, MD;  Location: [facility];  Service: Cardiopulmonary;  Laterality: Bilateral;  . Abdominal hysterectomy    . Laparotomy N/A 11/03/2013    Procedure: EXPLORATORY LAPAROTOMY, DRAINAGE OF INTRA  ABDOMINAL ABSCESSES, MOBILIZATION OF SPLENIC FLEXURE, SIGMOID COLECTOMY WITH COLOSTOMY;  Surgeon: Odis Hollingshead, MD;  Location: [facility];  Service: General;  Laterality: N/A;    Family History  Problem Relation Age of Onset  . Emphysema Father     smoked  . Lung cancer Father     smoked  . Cancer Mother   . Hypertension Mother   . COPD Mother     Social History History  Substance Use Topics  . Smoking status: Former Smoker -- 1.00 packs/day for 40 years    Types: Cigarettes    Quit date: 09/27/2013  . Smokeless tobacco: Never Used  . Alcohol Use: No    Allergies  Allergen Reactions  . Codeine Nausea And Vomiting    Current Outpatient Prescriptions  Medication Sig Dispense Refill  . ALPRAZolam (XANAX) 0.5 MG tablet One four times a day as needed for nerves or pain  120 tablet  0  . emollient (BIAFINE) cream Apply 1 application topically daily. Apply daily after rad tx      . HYDROcodone-acetaminophen (NORCO) 10-325 MG per tablet Take 1 tablet by mouth every 4 (four) hours as needed.  30 tablet  0  . ibuprofen (ADVIL,MOTRIN) 200 MG tablet You can take 2-3 tablets every 6 hours as needed for pain.  30 tablet  0  . Multiple Vitamin (MULTIVITAMIN WITH MINERALS) TABS tablet Take 1 daily with lunch and a meal.  Make sure it has iron for women.      Marland Kitchen omeprazole (PRILOSEC) 20 MG capsule Take 20 mg by mouth daily.      . ondansetron (ZOFRAN) 8 MG tablet Take 1 tablet (8 mg total) by mouth every 8 (eight) hours as needed for  nausea or vomiting.  30 tablet  0  . oxyCODONE (ROXICODONE) 5 MG/5ML solution Take 5-10 mLs (5-10 mg total) by mouth every 4 (four) hours as needed for severe pain.  250 mL  0   No current facility-administered medications for this visit.    Review of Systems  Constitutional: positive for anorexia, fatigue and weight loss Eyes: negative Ears, nose, mouth, throat, and face: negative Respiratory: negative Cardiovascular: negative Gastrointestinal: positive for nausea Genitourinary:negative Integument/breast: negative Hematologic/lymphatic: negative Musculoskeletal:positive for muscle weakness Neurological: negative Behavioral/Psych: negative Endocrine: negative Allergic/Immunologic: negative  Physical Exam  AVW:PVXYI, healthy, no distress, well nourished and well developed SKIN: skin color, texture, turgor are normal, no rashes or significant lesions HEAD: Normocephalic, No masses, lesions, tenderness or abnormalities EYES: normal, PERRLA EARS: External ears normal, Canals clear OROPHARYNX:no exudate, no erythema and lips, buccal mucosa, and tongue normal  NECK: supple, no adenopathy, no JVD LYMPH:  no palpable lymphadenopathy, no hepatosplenomegaly BREAST:not examined LUNGS: clear to auscultation , and palpation HEART: regular rate & rhythm, no murmurs and no gallops ABDOMEN:abdomen soft, non-tender, normal bowel sounds and no masses or organomegaly BACK: Back symmetric, no curvature., No CVA tenderness EXTREMITIES:no joint deformities, effusion,  or inflammation, no edema, no skin discoloration  NEURO: alert & oriented x 3 with fluent speech, no focal motor/sensory deficits  PERFORMANCE STATUS: ECOG 1  LABORATORY DATA: Lab Results  Component Value Date   WBC 8.0 12/11/2013   HGB 10.0* 12/11/2013   HCT 30.6* 12/11/2013   MCV 94.3 12/11/2013   PLT 433* 12/11/2013      Chemistry      Component Value Date/Time   NA 143 12/11/2013 1119   NA 138 11/16/2013 0530   K 2.9*  12/11/2013 1119   K 3.5* 11/16/2013 0530   CL 100 11/16/2013 0530   CO2 31* 12/11/2013 1119   CO2 28 11/16/2013 0530   BUN 11.5 12/11/2013 1119   BUN 13 11/16/2013 0530   CREATININE 0.6 12/11/2013 1119   CREATININE 0.48* 11/16/2013 0530      Component Value Date/Time   CALCIUM 8.8 12/11/2013 1119   CALCIUM 8.6 11/16/2013 0530   ALKPHOS 81 12/11/2013 1119   ALKPHOS 145* 11/16/2013 0530   AST 8 12/11/2013 1119   AST 21 11/16/2013 0530   ALT <6 12/11/2013 1119   ALT 36* 11/16/2013 0530   BILITOT 0.25 12/11/2013 1119   BILITOT 0.2* 11/16/2013 0530       RADIOGRAPHIC STUDIES: No results found.  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  PLAN: I have a lengthy discussion with the patient and her husband today about her current disease stage, prognosis and treatment options. I will ask the pathology Department to send her tissue to be tested for EGFR mutation as well as ALK gene translocation. If the patient has a positive mutation, she can be treated with a target biologic agent. Otherwise her treatment options would be in the form of systemic chemotherapy and most likely with carboplatin and Alimta versus palliative care and close monitoring. The patient is interested in systemic chemotherapy if needed. I would see her back for followup visit in 3 weeks for reevaluation and discussion of the molecular studies as well as treatment options. She will need some time to recover from the recent surgery as well as the palliative radiotherapy. She was advised to call immediately if she has any concerning symptoms in the interval. The patient voices understanding of  current disease status and treatment options and is in agreement with the current care plan.  All questions were answered. The patient knows to call the clinic with any problems, questions or concerns. We can certainly see the patient much sooner if necessary.  Thank you so much for allowing me to participate in the care of [place]. I will continue to follow up the patient with you and assist in her care.  I spent 40 minutes counseling the patient face to face. The total time spent in the appointment was 60 minutes.  Disclaimer: This note was dictated with voice recognition software. Similar sounding words can inadvertently be transcribed and may not be corrected upon review.   Curt Bears 12/11/2013, 12:23 PM

## 2013-12-11 NOTE — Progress Notes (Signed)
Checked in new patient with no financial issues.  °

## 2013-12-11 NOTE — Telephone Encounter (Signed)
Gave pt appt for lab and Md for May 2015

## 2013-12-13 NOTE — Progress Notes (Addendum)
  Radiation Oncology         [phone] ________________________________  Name: Alexis [name] MRN: [id number]  Date: 10/17/2013  DOB: 1951/07/04  SIMULATION AND TREATMENT PLANNING NOTE  DIAGNOSIS:  Lung cancer  NARRATIVE:  The patient was brought to the [place] suite.  Identity was confirmed.  All relevant records and images related to the planned course of therapy were reviewed.   Written consent to proceed with treatment was confirmed which was freely given after reviewing the details related to the planned course of therapy had been reviewed with the patient.  Then, the patient was set-up in a stable reproducible supine position for radiation therapy.  The patient's arms were raised above using a wing board device. CT images were obtained.  An isocenter was placed within the chest in relation to the target volume. Surface markings were placed.    The CT images were loaded into the planning software.  Then the target and avoidance structures were contoured.  Treatment planning then occurred.  The radiation prescription was entered and confirmed.  A total of 5 complex treatment devices were fabricated which correspond to the designed customized radiation treatment fields. Each of these customized fields/ complex treatment devices will be used on a daily basis during the radiation course. I have requested a 3D Simulation.  I have requested a DVH of the following structures: target volume, spinal cord, lungs, esophagus.   The patient will undergo daily image guidance to ensure accurate localization of the target, and adequate minimize dose to the normal surrounding structures in close proximity to the target.   PLAN:  The patient will  receive 50 Gy in 25 fractions.    ________________________________   Jodelle Gross, MD, [name]

## 2013-12-13 NOTE — Addendum Note (Signed)
Encounter addended by: Marye Round, MD on: 12/13/2013  9:25 AM<BR>     Documentation filed: Notes Section, Visit Diagnoses, Follow-up Section, LOS Section

## 2013-12-13 NOTE — Progress Notes (Signed)
  Radiation Oncology         [phone] ________________________________  Name: Alexis [name] MRN: [id number]  Date: 12/05/2013  DOB: 1950/10/10  End of Treatment Note  Diagnosis:   Non-small cell lung cancer     Indication for treatment:  Palliative       Radiation treatment dates:   10/28/2013 through 12/05/2013  Site/dose:   The patient was treated to the right lung tumor to a dose of 50 gray in 25 fractions using a 3-D conformal technique. Daily image guidance was used for the patient's treatment.  Narrative: The patient tolerated radiation treatment relatively well.   The patient had improvement in her pain during treatment. One significant issue was a bowel perforation which led to hospitalization during her course of radiation treatment. This was unrelated to the treatment area.  Plan: The patient has completed radiation treatment. The patient will return to radiation oncology clinic for routine followup in one month. I advised the patient to call or return sooner if they have any questions or concerns related to their recovery or treatment. ________________________________  Jodelle Gross, M.D., Ph.D.

## 2013-12-16 ENCOUNTER — Encounter (HOSPITAL_COMMUNITY): Payer: Self-pay

## 2013-12-18 ENCOUNTER — Encounter (HOSPITAL_COMMUNITY): Payer: Self-pay

## 2013-12-25 ENCOUNTER — Ambulatory Visit (INDEPENDENT_AMBULATORY_CARE_PROVIDER_SITE_OTHER): Payer: BC Managed Care – PPO | Admitting: General Surgery

## 2013-12-25 ENCOUNTER — Encounter (INDEPENDENT_AMBULATORY_CARE_PROVIDER_SITE_OTHER): Payer: Self-pay | Admitting: General Surgery

## 2013-12-25 VITALS — BP 122/80 | HR 74 | Temp 97.5°F | Ht 64.0 in | Wt 102.2 lb

## 2013-12-25 DIAGNOSIS — Z4889 Encounter for other specified surgical aftercare: Secondary | ICD-10-CM | POA: Insufficient documentation

## 2013-12-25 MED ORDER — OXYCODONE HCL 5 MG PO TABS: 5.0000 mg | ORAL_TABLET | Freq: Four times a day (QID) | ORAL | Status: DC | PRN

## 2013-12-25 NOTE — Patient Instructions (Signed)
Increase hydration for the next 3 days as we discussed. Diet as tolerated. Continue current wound care.

## 2013-12-25 NOTE — Progress Notes (Signed)
Procedure:  Emergency exploratory laparotomy with drainage of intra-abdominal abscesses and Hartmann procedure for perforated sigmoid diverticulitis  Date:  11/03/2013  Pathology:  Diverticulitis  History:  She presents for another postoperative visit. She developed a sore throat with some nausea vomiting and diarrhea on Sunday. This lasted 2 days but she is getting better now. No fever. She does feel somewhat weak. Exam: General- Is in NAD. Abdomen-stool coming out of the left upper quadrant colostomy. Midline incision is open and is healing well with granulation tissue present.  Assessment: Sounds like she had a gastrointestinal virus which is getting better. No signs of intestinal traction or surgical complications. Plan: Increase hydration. Diet as tolerated. Continue light activities. Continue current wound care. Return visit 3 weeks.

## 2014-01-02 ENCOUNTER — Ambulatory Visit (HOSPITAL_BASED_OUTPATIENT_CLINIC_OR_DEPARTMENT_OTHER): Payer: BC Managed Care – PPO | Admitting: Internal Medicine

## 2014-01-02 ENCOUNTER — Encounter: Payer: Self-pay | Admitting: Internal Medicine

## 2014-01-02 ENCOUNTER — Telehealth: Payer: Self-pay | Admitting: Internal Medicine

## 2014-01-02 ENCOUNTER — Other Ambulatory Visit (HOSPITAL_BASED_OUTPATIENT_CLINIC_OR_DEPARTMENT_OTHER): Payer: BC Managed Care – PPO

## 2014-01-02 ENCOUNTER — Telehealth: Payer: Self-pay | Admitting: Medical Oncology

## 2014-01-02 ENCOUNTER — Ambulatory Visit (HOSPITAL_BASED_OUTPATIENT_CLINIC_OR_DEPARTMENT_OTHER): Payer: BC Managed Care – PPO

## 2014-01-02 VITALS — BP 96/71 | HR 78 | Temp 97.9°F | Resp 17 | Ht 64.0 in | Wt 107.6 lb

## 2014-01-02 DIAGNOSIS — F329 Major depressive disorder, single episode, unspecified: Secondary | ICD-10-CM

## 2014-01-02 DIAGNOSIS — C7931 Secondary malignant neoplasm of brain: Secondary | ICD-10-CM

## 2014-01-02 DIAGNOSIS — R5383 Other fatigue: Secondary | ICD-10-CM

## 2014-01-02 DIAGNOSIS — E876 Hypokalemia: Secondary | ICD-10-CM

## 2014-01-02 DIAGNOSIS — C3410 Malignant neoplasm of upper lobe, unspecified bronchus or lung: Secondary | ICD-10-CM

## 2014-01-02 DIAGNOSIS — C7949 Secondary malignant neoplasm of other parts of nervous system: Secondary | ICD-10-CM

## 2014-01-02 DIAGNOSIS — F3289 Other specified depressive episodes: Secondary | ICD-10-CM

## 2014-01-02 DIAGNOSIS — R5381 Other malaise: Secondary | ICD-10-CM

## 2014-01-02 LAB — COMPREHENSIVE METABOLIC PANEL (CC13)
ALT: 7 U/L (ref 0–55)
AST: 9 U/L (ref 5–34)
Albumin: 3 g/dL — ABNORMAL LOW (ref 3.5–5.0)
Alkaline Phosphatase: 74 U/L (ref 40–150)
Anion Gap: 9 mEq/L (ref 3–11)
BUN: 7.5 mg/dL (ref 7.0–26.0)
CO2: 31 mEq/L — ABNORMAL HIGH (ref 22–29)
Calcium: 9.4 mg/dL (ref 8.4–10.4)
Chloride: 104 mEq/L (ref 98–109)
Creatinine: 0.6 mg/dL (ref 0.6–1.1)
Glucose: 104 mg/dl (ref 70–140)
Potassium: 3.1 mEq/L — ABNORMAL LOW (ref 3.5–5.1)
Sodium: 144 mEq/L (ref 136–145)
Total Bilirubin: 0.21 mg/dL (ref 0.20–1.20)
Total Protein: 6.4 g/dL (ref 6.4–8.3)

## 2014-01-02 LAB — CBC WITH DIFFERENTIAL/PLATELET
BASO%: 0.6 % (ref 0.0–2.0)
Basophils Absolute: 0 10*3/uL (ref 0.0–0.1)
EOS%: 2.4 % (ref 0.0–7.0)
Eosinophils Absolute: 0.1 10*3/uL (ref 0.0–0.5)
HCT: 37 % (ref 34.8–46.6)
HGB: 11.5 g/dL — ABNORMAL LOW (ref 11.6–15.9)
LYMPH%: 19.1 % (ref 14.0–49.7)
MCH: 30.1 pg (ref 25.1–34.0)
MCHC: 31.1 g/dL — ABNORMAL LOW (ref 31.5–36.0)
MCV: 96.9 fL (ref 79.5–101.0)
MONO#: 0.5 10*3/uL (ref 0.1–0.9)
MONO%: 11 % (ref 0.0–14.0)
NEUT#: 3.3 10*3/uL (ref 1.5–6.5)
NEUT%: 66.9 % (ref 38.4–76.8)
Platelets: 317 10*3/uL (ref 145–400)
RBC: 3.82 10*6/uL (ref 3.70–5.45)
RDW: 14.9 % — ABNORMAL HIGH (ref 11.2–14.5)
WBC: 4.9 10*3/uL (ref 3.9–10.3)
lymph#: 0.9 10*3/uL (ref 0.9–3.3)

## 2014-01-02 MED ORDER — PROCHLORPERAZINE MALEATE 10 MG PO TABS: 10.0000 mg | ORAL_TABLET | Freq: Four times a day (QID) | ORAL | Status: DC | PRN

## 2014-01-02 MED ORDER — CYANOCOBALAMIN 1000 MCG/ML IJ SOLN
1000.0000 ug | Freq: Once | INTRAMUSCULAR | Status: AC
  Administered 2014-01-02: 1000 ug via INTRAMUSCULAR

## 2014-01-02 MED ORDER — POTASSIUM CHLORIDE CRYS ER 20 MEQ PO TBCR: 20.0000 meq | EXTENDED_RELEASE_TABLET | Freq: Every day | ORAL | Status: DC

## 2014-01-02 MED ORDER — CYANOCOBALAMIN 1000 MCG/ML IJ SOLN
INTRAMUSCULAR | Status: AC
  Filled 2014-01-02: qty 1

## 2014-01-02 MED ORDER — FOLIC ACID 1 MG PO TABS: 1.0000 mg | ORAL_TABLET | Freq: Every day | ORAL | Status: DC

## 2014-01-02 MED ORDER — DEXAMETHASONE 4 MG PO TABS: ORAL_TABLET | ORAL | Status: DC

## 2014-01-02 MED ORDER — MIRTAZAPINE 30 MG PO TABS: 30.0000 mg | ORAL_TABLET | Freq: Every day | ORAL | Status: DC

## 2014-01-02 NOTE — Telephone Encounter (Signed)
Gave pt appt for lab and md,Alexis [name] will put chemo

## 2014-01-02 NOTE — Telephone Encounter (Signed)
Gave pt appt for lab, chemo class and MD

## 2014-01-02 NOTE — Progress Notes (Signed)
[facility] Telephone:([phone]   Fax:(336) [place], MD [address] [place] [address]  DIAGNOSIS: Stage IV (T2a, N0, M1b) non-small cell lung cancer consistent with adenocarcinoma with negative EGFR mutation and negative ALK gene translocation diagnosed in January of 2015 presented with right upper lobe lung mass in addition to a solitary brain metastasis status post stereotactic to a solitary brain metastasis as well as radiotherapy to the right upper lobe lung mass.  PRIOR THERAPY:  1) Status post stereotactic radiotherapy to a solitary right parietal brain lesion under the care of Dr. [name] on 10/16/2013. 2) Status post palliative radiotherapy to the right lung tumor under the care of Dr. [name] completed on 12/05/2013.  CURRENT THERAPY: Systemic chemotherapy with carboplatin for AUC of 5 and Alimta 500 mg/M2 every 3 weeks. First dose Jan 06 2014  INTERVAL HISTORY: Alexis [name] 63 y.o. female returns to the clinic today for followup visit accompanied by her husband. The patient is feeling fine today with no specific complaints except for fatigue and depression. She was seen recently by Dr. Zella Richer for evaluation and consideration of reversal of her colostomy but the patient is too weak to undergo the procedure and there was concern about the healing process. Dr. Zella Richer informed the patient that she may not be able to get reversal of her colostomy for at least a year and she was very depressed and unhappy with this news. She completed the course of palliative radiotherapy under the care of Dr. [name]. The patient denied having any significant fever or chills. She has no nausea or vomiting. She denied having any significant chest pain but continues to have shortness breath with exertion with no cough or hemoptysis. She is here today for evaluation and discussion of her treatment options. The molecular studies showed negative  EGFR mutation and negative ALK gene translocation.  MEDICAL HISTORY: Past Medical History  Diagnosis Date  . Cancer     lung, hx cervicle  . Ileus, postoperative 11/18/2013  . Physical deconditioning 11/18/2013  . Severe protein-calorie malnutrition 11/18/2013  . Anemia 11/18/2013    ALLERGIES:  is allergic to codeine.  MEDICATIONS:  Current Outpatient Prescriptions  Medication Sig Dispense Refill  . ALPRAZolam (XANAX) 0.5 MG tablet One four times a day as needed for nerves or pain  120 tablet  0  . emollient (BIAFINE) cream Apply 1 application topically daily. Apply daily after rad tx      . HYDROcodone-acetaminophen (NORCO) 10-325 MG per tablet Take 1 tablet by mouth every 4 (four) hours as needed.  30 tablet  0  . ibuprofen (ADVIL,MOTRIN) 200 MG tablet You can take 2-3 tablets every 6 hours as needed for pain.  30 tablet  0  . Multiple Vitamin (MULTIVITAMIN WITH MINERALS) TABS tablet Take 1 daily with lunch and a meal.  Make sure it has iron for women.      Marland Kitchen omeprazole (PRILOSEC) 20 MG capsule Take 20 mg by mouth daily.      . ondansetron (ZOFRAN) 8 MG tablet Take 1 tablet (8 mg total) by mouth every 8 (eight) hours as needed for nausea or vomiting.  30 tablet  0  . oxyCODONE (OXY IR/ROXICODONE) 5 MG immediate release tablet Take 1 tablet (5 mg total) by mouth every 6 (six) hours as needed.  30 tablet  0  . potassium chloride SA (K-DUR,KLOR-CON) 20 MEQ tablet Take 2 tablets (40 mEq total)  by mouth as directed. 40 meq x 7 days for low potassium level.  14 tablet  0   No current facility-administered medications for this visit.    SURGICAL HISTORY:  Past Surgical History  Procedure Laterality Date  . Video bronchoscopy Bilateral 08/30/2013    Procedure: VIDEO BRONCHOSCOPY WITH FLUORO;  Surgeon: Tanda Rockers, MD;  Location: [facility];  Service: Cardiopulmonary;  Laterality: Bilateral;  . Abdominal hysterectomy    . Laparotomy N/A 11/03/2013    Procedure: EXPLORATORY LAPAROTOMY,  DRAINAGE OF INTRA  ABDOMINAL ABSCESSES, MOBILIZATION OF SPLENIC FLEXURE, SIGMOID COLECTOMY WITH COLOSTOMY;  Surgeon: Odis Hollingshead, MD;  Location: [facility];  Service: General;  Laterality: N/A;    REVIEW OF SYSTEMS:  Constitutional: positive for fatigue Eyes: negative Ears, nose, mouth, throat, and face: negative Respiratory: negative Cardiovascular: negative Gastrointestinal: negative Genitourinary:negative Integument/breast: negative Hematologic/lymphatic: negative Musculoskeletal:negative Neurological: positive for weakness Behavioral/Psych: positive for depression Endocrine: negative Allergic/Immunologic: negative   PHYSICAL EXAMINATION: General appearance: alert, cooperative, fatigued and no distress Head: Normocephalic, without obvious abnormality, atraumatic Neck: no adenopathy, no JVD, supple, symmetrical, trachea midline and thyroid not enlarged, symmetric, no tenderness/mass/nodules Lymph nodes: Cervical, supraclavicular, and axillary nodes normal. Resp: clear to auscultation bilaterally Back: symmetric, no curvature. ROM normal. No CVA tenderness. Cardio: regular rate and rhythm, S1, S2 normal, no murmur, click, rub or gallop GI: soft, non-tender; bowel sounds normal; no masses,  no organomegaly Extremities: extremities normal, atraumatic, no cyanosis or edema Neurologic: Alert and oriented X 3, normal strength and tone. Normal symmetric reflexes. Normal coordination and gait  ECOG PERFORMANCE STATUS: 2 - Symptomatic, <50% confined to bed  Blood pressure 96/71, pulse 78, temperature 97.9 F (36.6 C), temperature source Oral, resp. rate 17, height _0  (1.626 m), weight 107 lb 9.6 oz (48.807 kg), SpO2 100.00%.  LABORATORY DATA: Lab Results  Component Value Date   WBC 4.9 01/02/2014   HGB 11.5* 01/02/2014   HCT 37.0 01/02/2014   MCV 96.9 01/02/2014   PLT 317 01/02/2014      Chemistry      Component Value Date/Time   NA 143 12/11/2013 1119   NA 138 11/16/2013 0530   K  2.9* 12/11/2013 1119   K 3.5* 11/16/2013 0530   CL 100 11/16/2013 0530   CO2 31* 12/11/2013 1119   CO2 28 11/16/2013 0530   BUN 11.5 12/11/2013 1119   BUN 13 11/16/2013 0530   CREATININE 0.6 12/11/2013 1119   CREATININE 0.48* 11/16/2013 0530      Component Value Date/Time   CALCIUM 8.8 12/11/2013 1119   CALCIUM 8.6 11/16/2013 0530   ALKPHOS 81 12/11/2013 1119   ALKPHOS 145* 11/16/2013 0530   AST 8 12/11/2013 1119   AST 21 11/16/2013 0530   ALT <6 12/11/2013 1119   ALT 36* 11/16/2013 0530   BILITOT 0.25 12/11/2013 1119   BILITOT 0.2* 11/16/2013 0530       RADIOGRAPHIC STUDIES: No results found.  ASSESSMENT AND PLAN: This is a very pleasant 63 years old white female recently diagnosed with a stage IV non-small cell lung cancer, adenocarcinoma with negative EGFR mutation and negative ALK gene translocation. The patient is status post stereotactic radiotherapy to a solitary brain metastases in addition to palliative radiotherapy to the right upper lobe lung mass. I have a lengthy discussion with the patient and her husband today about her treatment options. I gave the patient the option of palliative care and hospice referral versus consideration of systemic chemotherapy with carboplatin for AUC of  5 and Alimta 500 mg/M2 every 3 weeks. I discussed with the patient adverse effect of the chemotherapy including but not limited to alopecia, myelosuppression, nausea and vomiting, peripheral neuropathy, liver or renal dysfunction. The patient is interested in proceeding with chemotherapy. I will arrange for her to have a chemotherapy education class today. The patient will receive vitamin B12 injection today. I will Escribe Compazine 10 mg by mouth every 6 hours as needed for nausea, folic acid 1 mg by mouth daily in addition to Decadron 4 mg by mouth twice a day the day before, day of and day after the chemotherapy. She is expected to start the first cycle of her chemotherapy on 01/06/2014. For depression, I  will start the patient on Remeron 30 mg by mouth each bedtime. She would come back for followup visit in 2 weeks for evaluation and management any adverse effect of her treatment. The patient was advised to call immediately if she has any concerning symptoms in the interval.  The patient voices understanding of current disease status and treatment options and is in agreement with the current care plan.  All questions were answered. The patient knows to call the clinic with any problems, questions or concerns. We can certainly see the patient much sooner if necessary.  I spent 20 minutes counseling the patient face to face. The total time spent in the appointment was 30 minutes.  Disclaimer: This note was dictated with voice recognition software. Similar sounding words can inadvertently be transcribed and may not be corrected upon review.

## 2014-01-02 NOTE — Telephone Encounter (Signed)
Message copied by Ardeen Garland on Thu Jan 02, 2014  3:48 PM ------      Message from: Curt Bears      Created: Thu Jan 02, 2014  1:20 PM       Call patient with the result and order K Dur 20 meq po qd X 7 ------

## 2014-01-02 NOTE — Progress Notes (Signed)
Quick Note:  Call patient with the result and order K Dur 20 meq po qd X 7 ______

## 2014-01-02 NOTE — Telephone Encounter (Signed)
Pt notified and rx sent to [facility].

## 2014-01-03 ENCOUNTER — Other Ambulatory Visit: Payer: BC Managed Care – PPO

## 2014-01-03 ENCOUNTER — Encounter: Payer: Self-pay | Admitting: *Deleted

## 2014-01-03 NOTE — Progress Notes (Signed)
[facility] Psychosocial Distress Screening Clinical Social Work  Clinical Social Work was referred by distress screening protocol.  The patient scored a 8 on the Psychosocial Distress Thermometer which indicates severe distress. Clinical Social Worker contacted patient by phone to assess for distress and other psychosocial needs. The patient states she is doing well.  She reports she has battled depression for a long time, but is recently most fearful of not knowing what to expect.  CSW and patient discuss common feelings of fear of uncertainty.  Alexis [name] starts her first treatment this coming Monday.  The patient shared how she has felt so well supported by the [facility] staff.  Patient reports MD prescribed her a medication to help with her depression.  CSW encouraged patient to contact CSW for additional support.  ONCBCN DISTRESS SCREENING 01/02/2014  Alexis [name] the number that describes how much distress you have been experiencing in the past week 8  Emotional problem type Depression;Nervousness/Anxiety;Adjusting to illness;Adjusting to appearance changes;Boredom  Physical Problem type Pain;Mouth sores/swallowing;Skin dry/itchy  Physician notified of physical symptoms Yes  Referral to clinical social work Yes    Clinical Social Worker follow up needed: no  If yes, follow up plan:    [name], MSW, LCSW, OSW-C Clinical Social Worker [facility] [phone]

## 2014-01-06 ENCOUNTER — Ambulatory Visit (HOSPITAL_BASED_OUTPATIENT_CLINIC_OR_DEPARTMENT_OTHER): Payer: BC Managed Care – PPO

## 2014-01-06 ENCOUNTER — Other Ambulatory Visit: Payer: Self-pay | Admitting: *Deleted

## 2014-01-06 ENCOUNTER — Ambulatory Visit: Payer: BC Managed Care – PPO | Admitting: Nutrition

## 2014-01-06 ENCOUNTER — Other Ambulatory Visit (HOSPITAL_BASED_OUTPATIENT_CLINIC_OR_DEPARTMENT_OTHER): Payer: BC Managed Care – PPO

## 2014-01-06 VITALS — BP 125/74 | HR 101 | Temp 97.1°F | Resp 18

## 2014-01-06 DIAGNOSIS — C7931 Secondary malignant neoplasm of brain: Secondary | ICD-10-CM

## 2014-01-06 DIAGNOSIS — C3410 Malignant neoplasm of upper lobe, unspecified bronchus or lung: Secondary | ICD-10-CM

## 2014-01-06 DIAGNOSIS — Z5111 Encounter for antineoplastic chemotherapy: Secondary | ICD-10-CM

## 2014-01-06 DIAGNOSIS — C7949 Secondary malignant neoplasm of other parts of nervous system: Secondary | ICD-10-CM

## 2014-01-06 LAB — CBC WITH DIFFERENTIAL/PLATELET
BASO%: 0.1 % (ref 0.0–2.0)
Basophils Absolute: 0 10*3/uL (ref 0.0–0.1)
EOS%: 0.1 % (ref 0.0–7.0)
Eosinophils Absolute: 0 10*3/uL (ref 0.0–0.5)
HCT: 36 % (ref 34.8–46.6)
HGB: 11.8 g/dL (ref 11.6–15.9)
LYMPH%: 6.2 % — ABNORMAL LOW (ref 14.0–49.7)
MCH: 30.8 pg (ref 25.1–34.0)
MCHC: 32.7 g/dL (ref 31.5–36.0)
MCV: 94.2 fL (ref 79.5–101.0)
MONO#: 0.3 10*3/uL (ref 0.1–0.9)
MONO%: 3.2 % (ref 0.0–14.0)
NEUT#: 7.2 10*3/uL — ABNORMAL HIGH (ref 1.5–6.5)
NEUT%: 90.4 % — ABNORMAL HIGH (ref 38.4–76.8)
Platelets: 372 10*3/uL (ref 145–400)
RBC: 3.82 10*6/uL (ref 3.70–5.45)
RDW: 15.9 % — ABNORMAL HIGH (ref 11.2–14.5)
WBC: 7.9 10*3/uL (ref 3.9–10.3)
lymph#: 0.5 10*3/uL — ABNORMAL LOW (ref 0.9–3.3)

## 2014-01-06 LAB — COMPREHENSIVE METABOLIC PANEL (CC13)
ALT: 10 U/L (ref 0–55)
AST: 8 U/L (ref 5–34)
Albumin: 3.2 g/dL — ABNORMAL LOW (ref 3.5–5.0)
Alkaline Phosphatase: 71 U/L (ref 40–150)
Anion Gap: 13 mEq/L — ABNORMAL HIGH (ref 3–11)
BUN: 9.3 mg/dL (ref 7.0–26.0)
CO2: 24 mEq/L (ref 22–29)
Calcium: 9.7 mg/dL (ref 8.4–10.4)
Chloride: 106 mEq/L (ref 98–109)
Creatinine: 0.6 mg/dL (ref 0.6–1.1)
Glucose: 141 mg/dl — ABNORMAL HIGH (ref 70–140)
Potassium: 4 mEq/L (ref 3.5–5.1)
Sodium: 143 mEq/L (ref 136–145)
Total Bilirubin: 0.2 mg/dL (ref 0.20–1.20)
Total Protein: 6.7 g/dL (ref 6.4–8.3)

## 2014-01-06 MED ORDER — DEXAMETHASONE SODIUM PHOSPHATE 20 MG/5ML IJ SOLN
20.0000 mg | Freq: Once | INTRAMUSCULAR | Status: AC
  Administered 2014-01-06: 20 mg via INTRAVENOUS

## 2014-01-06 MED ORDER — SODIUM CHLORIDE 0.9 % IV SOLN
500.0000 mg/m2 | Freq: Once | INTRAVENOUS | Status: AC
  Administered 2014-01-06: 750 mg via INTRAVENOUS
  Filled 2014-01-06: qty 30

## 2014-01-06 MED ORDER — DEXAMETHASONE SODIUM PHOSPHATE 20 MG/5ML IJ SOLN
INTRAMUSCULAR | Status: AC
  Filled 2014-01-06: qty 5

## 2014-01-06 MED ORDER — SODIUM CHLORIDE 0.9 % IV SOLN
406.0000 mg | Freq: Once | INTRAVENOUS | Status: AC
  Administered 2014-01-06: 410 mg via INTRAVENOUS
  Filled 2014-01-06: qty 41

## 2014-01-06 MED ORDER — ONDANSETRON 16 MG/50ML IVPB (CHCC)
16.0000 mg | Freq: Once | INTRAVENOUS | Status: AC
  Administered 2014-01-06: 16 mg via INTRAVENOUS

## 2014-01-06 MED ORDER — SODIUM CHLORIDE 0.9 % IV SOLN
Freq: Once | INTRAVENOUS | Status: AC
  Administered 2014-01-06: 09:00:00 via INTRAVENOUS

## 2014-01-06 MED ORDER — ONDANSETRON 16 MG/50ML IVPB (CHCC)
INTRAVENOUS | Status: AC
  Filled 2014-01-06: qty 16

## 2014-01-06 NOTE — Patient Instructions (Signed)
[facility] Discharge Instructions for Patients Receiving Chemotherapy  Today you received the following chemotherapy agents Alimta/Carboplatin.  To help prevent nausea and vomiting after your treatment, we encourage you to take your nausea medication as prescribed.   If you develop nausea and vomiting that is not controlled by your nausea medication, call the clinic.   BELOW ARE SYMPTOMS THAT SHOULD BE REPORTED IMMEDIATELY:  *FEVER GREATER THAN 100.5 F  *CHILLS WITH OR WITHOUT FEVER  NAUSEA AND VOMITING THAT IS NOT CONTROLLED WITH YOUR NAUSEA MEDICATION  *UNUSUAL SHORTNESS OF BREATH  *UNUSUAL BRUISING OR BLEEDING  TENDERNESS IN MOUTH AND THROAT WITH OR WITHOUT PRESENCE OF ULCERS  *URINARY PROBLEMS  *BOWEL PROBLEMS  UNUSUAL RASH Items with * indicate a potential emergency and should be followed up as soon as possible.  Feel free to call the clinic you have any questions or concerns. The clinic phone number is [phone].

## 2014-01-06 NOTE — Progress Notes (Addendum)
63 year old female diagnosed with non-small cell lung cancer.  She is a patient of Dr. Earlie Server.     Prefers to be called LUCILLE.  Past medical history includes postop ileus, protein calorie malnutrition, colostomy and anemia.  Medications include Remeron, Xanax, Decadron, multivitamin, Prilosec, Zofran, K-Dur, and Compazine.  Labs include potassium 3.1, and albumin 3.0.  Height: 64 inches. Weight: 107.6 pounds. Usual body weight: 135 pounds. BMI: 18.46.  Patient reports she is eating well at this time.  She can eat anything that she would like to.  Patient reports colostomy output is satisfactory and she denies problems with colostomy. She has added one oral nutrition supplements daily.  Patient deferred nutrition physical exam today.  She has finished her treatment for today and is ready to go home.   Patient was diagnosed with severe protein calorie malnutrition during previous admission.  Nutrition diagnosis: Underweight related to lung cancer and history of inadequate oral intake as evidenced by 20% weight loss over 6 months from usual body weight.  BMI of 18.46.  Intervention: Patient educated to consume smaller, more frequent meals and snacks with protein every time she eats.  I reviewed high protein foods with patient.  I encouraged patient to continue one oral nutrition supplement daily.  I provided fact sheet for patient to take today.  I also gave her coupons and recipes.  Questions were answered.  Teach back method used.  Monitoring, evaluation, goals: Patient will work to increase oral intake to minimize further weight loss and contribute to healing and repletion.  Next visit: Monday, June 1, during chemotherapy.

## 2014-01-07 ENCOUNTER — Telehealth: Payer: Self-pay | Admitting: *Deleted

## 2014-01-07 NOTE — Telephone Encounter (Signed)
Spoke with patient.States she is doing well, had a "bad headache" last night after chemo. Took pain pill with good relief. Denies any nausea or vomiting. Drinking fluids and eating . To call if she has any problems or concerns

## 2014-01-07 NOTE — Telephone Encounter (Signed)
Message copied by Patton Salles on Tue Jan 07, 2014  1:00 PM ------      Message from: Renford Dills      Created: Mon Jan 06, 2014  9:54 AM      Regarding: chemo follow-up call       1st Alimta/Carbo  Dr. Julien Nordmann  [phone] ------

## 2014-01-13 ENCOUNTER — Other Ambulatory Visit: Payer: BC Managed Care – PPO

## 2014-01-13 ENCOUNTER — Telehealth: Payer: Self-pay | Admitting: Internal Medicine

## 2014-01-13 NOTE — Telephone Encounter (Signed)
returned pt call adn pt decided to keep appts as they are

## 2014-01-15 ENCOUNTER — Ambulatory Visit (HOSPITAL_BASED_OUTPATIENT_CLINIC_OR_DEPARTMENT_OTHER): Payer: BC Managed Care – PPO | Admitting: Physician Assistant

## 2014-01-15 ENCOUNTER — Ambulatory Visit (INDEPENDENT_AMBULATORY_CARE_PROVIDER_SITE_OTHER): Payer: BC Managed Care – PPO | Admitting: General Surgery

## 2014-01-15 ENCOUNTER — Other Ambulatory Visit (HOSPITAL_BASED_OUTPATIENT_CLINIC_OR_DEPARTMENT_OTHER): Payer: BC Managed Care – PPO

## 2014-01-15 ENCOUNTER — Encounter: Payer: Self-pay | Admitting: Physician Assistant

## 2014-01-15 ENCOUNTER — Telehealth: Payer: Self-pay | Admitting: Internal Medicine

## 2014-01-15 VITALS — BP 121/68 | HR 109 | Temp 97.4°F | Resp 18 | Ht 64.0 in | Wt 108.9 lb

## 2014-01-15 DIAGNOSIS — C7949 Secondary malignant neoplasm of other parts of nervous system: Secondary | ICD-10-CM

## 2014-01-15 DIAGNOSIS — C7931 Secondary malignant neoplasm of brain: Secondary | ICD-10-CM

## 2014-01-15 DIAGNOSIS — F3289 Other specified depressive episodes: Secondary | ICD-10-CM

## 2014-01-15 DIAGNOSIS — C3410 Malignant neoplasm of upper lobe, unspecified bronchus or lung: Secondary | ICD-10-CM

## 2014-01-15 DIAGNOSIS — Z0271 Encounter for disability determination: Secondary | ICD-10-CM

## 2014-01-15 DIAGNOSIS — F329 Major depressive disorder, single episode, unspecified: Secondary | ICD-10-CM

## 2014-01-15 DIAGNOSIS — Z4889 Encounter for other specified surgical aftercare: Secondary | ICD-10-CM

## 2014-01-15 LAB — CBC WITH DIFFERENTIAL/PLATELET
BASO%: 0.6 % (ref 0.0–2.0)
Basophils Absolute: 0 10*3/uL (ref 0.0–0.1)
EOS%: 1.7 % (ref 0.0–7.0)
Eosinophils Absolute: 0.1 10*3/uL (ref 0.0–0.5)
HCT: 35 % (ref 34.8–46.6)
HGB: 11.5 g/dL — ABNORMAL LOW (ref 11.6–15.9)
LYMPH%: 23.7 % (ref 14.0–49.7)
MCH: 30.7 pg (ref 25.1–34.0)
MCHC: 32.9 g/dL (ref 31.5–36.0)
MCV: 93.4 fL (ref 79.5–101.0)
MONO#: 0.5 10*3/uL (ref 0.1–0.9)
MONO%: 13.3 % (ref 0.0–14.0)
NEUT#: 2.3 10*3/uL (ref 1.5–6.5)
NEUT%: 60.7 % (ref 38.4–76.8)
Platelets: 236 10*3/uL (ref 145–400)
RBC: 3.75 10*6/uL (ref 3.70–5.45)
RDW: 14.9 % — ABNORMAL HIGH (ref 11.2–14.5)
WBC: 3.8 10*3/uL — ABNORMAL LOW (ref 3.9–10.3)
lymph#: 0.9 10*3/uL (ref 0.9–3.3)

## 2014-01-15 LAB — COMPREHENSIVE METABOLIC PANEL (CC13)
ALT: 24 U/L (ref 0–55)
AST: 14 U/L (ref 5–34)
Albumin: 3.3 g/dL — ABNORMAL LOW (ref 3.5–5.0)
Alkaline Phosphatase: 70 U/L (ref 40–150)
Anion Gap: 10 mEq/L (ref 3–11)
BUN: 14.2 mg/dL (ref 7.0–26.0)
CO2: 27 mEq/L (ref 22–29)
Calcium: 9.1 mg/dL (ref 8.4–10.4)
Chloride: 105 mEq/L (ref 98–109)
Creatinine: 0.9 mg/dL (ref 0.6–1.1)
Glucose: 108 mg/dl (ref 70–140)
Potassium: 3.7 mEq/L (ref 3.5–5.1)
Sodium: 142 mEq/L (ref 136–145)
Total Bilirubin: 0.24 mg/dL (ref 0.20–1.20)
Total Protein: 6.6 g/dL (ref 6.4–8.3)

## 2014-01-15 MED ORDER — HYDROCODONE-ACETAMINOPHEN 10-325 MG PO TABS: 1.0000 | ORAL_TABLET | Freq: Four times a day (QID) | ORAL | Status: DC | PRN

## 2014-01-15 NOTE — Progress Notes (Signed)
Procedure:  Emergency exploratory laparotomy with drainage of intra-abdominal abscesses and Hartmann procedure for perforated sigmoid diverticulitis  Date:  11/03/2013  Pathology:  Diverticulitis  History:  She presents for another postoperative visit.  She continues with treatments for her stage IV lung cancer. Her husband is changing her colostomy bag and her midline abdominal dressing. Exam: General- Is in NAD. Abdomen-stool coming out of the left upper quadrant pink colostomy. Midline incision is open and continues to heal well by secondary intention.  Assessment:  Wound and colostomy are healing well.  Plan:  Continue current wound care. Refer to Cena Benton for colostomy issues.   May drive. Return visit one month.

## 2014-01-15 NOTE — Telephone Encounter (Signed)
gv adn printed appt sched and avs for pt fro June

## 2014-01-15 NOTE — Patient Instructions (Signed)
Continue current wound care.  We will refer you to Cena Benton at [facility] is a colostomy care specialist.

## 2014-01-15 NOTE — Progress Notes (Addendum)
[facility] Telephone:([phone]) [phone]   Fax:(336) [place], [place] [address] [place] [address]  DIAGNOSIS: Stage IV (T2a, N0, M1b) non-small cell lung cancer consistent with adenocarcinoma with negative EGFR mutation and negative ALK gene translocation diagnosed in January of 2015 presented with right upper lobe lung mass in addition to a solitary brain metastasis status post stereotactic to a solitary brain metastasis as well as radiotherapy to the right upper lobe lung mass.  PRIOR THERAPY:  1) Status post stereotactic radiotherapy to a solitary right parietal brain lesion under the care of Dr. [name] on 10/16/2013. 2) Status post palliative radiotherapy to the right lung tumor under the care of Dr. [name] completed on 12/05/2013.  CURRENT THERAPY: Systemic chemotherapy with carboplatin for AUC of 5 and Alimta 500 mg/M2 every 3 weeks. First dose Jan 06 2014  INTERVAL HISTORY: Alexis [name] 63 y.o. female returns to the clinic today for followup visit accompanied by her husband. The patient is feeling fine today with no specific complaints except for fatigue. She presents for symptom management visit after her first cycle of systemic chemotherapy with carboplatin and Alimta. She had some generalized malaise and some nausea after the chemotherapy. Nausea was well-controlled with her current antiemetics. She complains of her head pounding but denies any vision changes. She completed the course of palliative radiotherapy under the care of Dr. [name]. The patient denied having any significant fever or chills. She has no vomiting. She denied having any significant chest pain but continues to have shortness breath with exertion with no cough or hemoptysis.   MEDICAL HISTORY: Past Medical History  Diagnosis Date  . Cancer     lung, hx cervicle  . Ileus, postoperative 11/18/2013  . Physical deconditioning 11/18/2013  . Severe  protein-calorie malnutrition 11/18/2013  . Anemia 11/18/2013    ALLERGIES:  is allergic to codeine.  MEDICATIONS:  Current Outpatient Prescriptions  Medication Sig Dispense Refill  . ALPRAZolam (XANAX) 0.5 MG tablet One four times a day as needed for nerves or pain  120 tablet  0  . dexamethasone (DECADRON) 4 MG tablet 4 mg by mouth twice a day the day before, day of and day after the chemotherapy every 3 weeks  40 tablet  1  . emollient (BIAFINE) cream Apply 1 application topically daily. Apply daily after rad tx      . HYDROcodone-acetaminophen (NORCO) 10-325 MG per tablet Take 1-2 tablets by mouth every 6 (six) hours as needed for moderate pain.  30 tablet  0  . ibuprofen (ADVIL,MOTRIN) 200 MG tablet You can take 2-3 tablets every 6 hours as needed for pain.  30 tablet  0  . mirtazapine (REMERON) 30 MG tablet Take 1 tablet (30 mg total) by mouth at bedtime.  30 tablet  2  . Multiple Vitamin (MULTIVITAMIN WITH MINERALS) TABS tablet Take 1 daily with lunch and a meal.  Make sure it has iron for women.      Marland Kitchen omeprazole (PRILOSEC) 20 MG capsule Take 20 mg by mouth daily.      . ondansetron (ZOFRAN) 8 MG tablet Take 1 tablet (8 mg total) by mouth every 8 (eight) hours as needed for nausea or vomiting.  30 tablet  0  . prochlorperazine (COMPAZINE) 10 MG tablet Take 1 tablet (10 mg total) by mouth every 6 (six) hours as needed for nausea or vomiting.  60 tablet  0  . folic  acid (FOLVITE) 1 MG tablet Take 1 tablet (1 mg total) by mouth daily.  30 tablet  4  . potassium chloride SA (K-DUR,KLOR-CON) 20 MEQ tablet Take 1 tablet (20 mEq total) by mouth daily.  7 tablet  0   No current facility-administered medications for this visit.    SURGICAL HISTORY:  Past Surgical History  Procedure Laterality Date  . Video bronchoscopy Bilateral 08/30/2013    Procedure: VIDEO BRONCHOSCOPY WITH FLUORO;  Surgeon: Tanda Rockers, MD;  Location: [facility];  Service: Cardiopulmonary;  Laterality: Bilateral;  .  Abdominal hysterectomy    . Laparotomy N/A 11/03/2013    Procedure: EXPLORATORY LAPAROTOMY, DRAINAGE OF INTRA  ABDOMINAL ABSCESSES, MOBILIZATION OF SPLENIC FLEXURE, SIGMOID COLECTOMY WITH COLOSTOMY;  Surgeon: Odis Hollingshead, MD;  Location: [facility];  Service: General;  Laterality: N/A;    REVIEW OF SYSTEMS:  Constitutional: positive for fatigue Eyes: negative Ears, nose, mouth, throat, and face: negative Respiratory: negative Cardiovascular: negative Gastrointestinal: positive for nausea Genitourinary:negative Integument/breast: negative Hematologic/lymphatic: negative Musculoskeletal:negative Neurological: negative Behavioral/Psych: positive for depression Endocrine: negative Allergic/Immunologic: negative   PHYSICAL EXAMINATION: General appearance: alert, cooperative, fatigued and no distress Head: Normocephalic, without obvious abnormality, atraumatic Neck: no adenopathy, no JVD, supple, symmetrical, trachea midline and thyroid not enlarged, symmetric, no tenderness/mass/nodules Lymph nodes: Cervical, supraclavicular, and axillary nodes normal. Resp: clear to auscultation bilaterally Back: symmetric, no curvature. ROM normal. No CVA tenderness. Cardio: regular rate and rhythm, S1, S2 normal, no murmur, click, rub or gallop GI: soft, non-tender; bowel sounds normal; no masses,  no organomegaly Extremities: extremities normal, atraumatic, no cyanosis or edema Neurologic: Alert and oriented X 3, normal strength and tone. Normal symmetric reflexes. Normal coordination and gait  ECOG PERFORMANCE STATUS: 2 - Symptomatic, <50% confined to bed  Blood pressure 121/68, pulse 109, temperature 97.4 F (36.3 C), temperature source Oral, resp. rate 18, height 5' 4"  (1.626 m), weight 108 lb 14.4 oz (49.397 kg).  LABORATORY DATA: Lab Results  Component Value Date   WBC 3.8* 01/15/2014   HGB 11.5* 01/15/2014   HCT 35.0 01/15/2014   MCV 93.4 01/15/2014   PLT 236 01/15/2014      Chemistry       Component Value Date/Time   NA 142 01/15/2014 1413   NA 138 11/16/2013 0530   K 3.7 01/15/2014 1413   K 3.5* 11/16/2013 0530   CL 100 11/16/2013 0530   CO2 27 01/15/2014 1413   CO2 28 11/16/2013 0530   BUN 14.2 01/15/2014 1413   BUN 13 11/16/2013 0530   CREATININE 0.9 01/15/2014 1413   CREATININE 0.48* 11/16/2013 0530      Component Value Date/Time   CALCIUM 9.1 01/15/2014 1413   CALCIUM 8.6 11/16/2013 0530   ALKPHOS 70 01/15/2014 1413   ALKPHOS 145* 11/16/2013 0530   AST 14 01/15/2014 1413   AST 21 11/16/2013 0530   ALT 24 01/15/2014 1413   ALT 36* 11/16/2013 0530   BILITOT 0.24 01/15/2014 1413   BILITOT 0.2* 11/16/2013 0530       RADIOGRAPHIC STUDIES: No results found.  ASSESSMENT AND PLAN: This is a very pleasant 63 years old white female recently diagnosed with a stage IV non-small cell lung cancer, adenocarcinoma with negative EGFR mutation and negative ALK gene translocation. The patient is status post stereotactic radiotherapy to a solitary brain metastases in addition to palliative radiotherapy to the right upper lobe lung mass. The patient was discussed with you and also seen by Dr. Julien Nordmann. She is status post  one cycle of systemic chemotherapy with carboplatin for AUC of 5 and Alimta 500 mg/M2 every 3 weeks.  For depression, the patient will continue on Remeron 30 mg by mouth each bedtime. She would come back for followup visit in 2 weeks for another symptom management prior to the start of cycle #2.   The patient was advised to call immediately if she has any concerning symptoms in the interval.  The patient voices understanding of current disease status and treatment options and is in agreement with the current care plan.  All questions were answered. The patient knows to call the clinic with any problems, questions or concerns. We can certainly see the patient much sooner if necessary.  Carlton Adam, PA-C  ADDENDUM: Hematology/Oncology Attending:  I had a face to face  encounter with the patient. I recommended her care plan. This is a very pleasant 63 years old white female recently diagnosed with stage IV non-small cell lung cancer, adenocarcinoma. She status post a stereotactic radiotherapy to a solitary brain lesion in addition to palliative radiotherapy to the right upper lung tumor. She is currently undergoing systemic chemotherapy with carboplatin and Alimta status post 1 cycle and tolerated the first dose of her treatment fairly well. I recommended for the patient to continue her current treatment was carboplatin and Alimta as scheduled. She would come back for follow up visit in 2 weeks with the start of cycle #2. For the progression she will continue on Remeron as prescribed.  Disclaimer: This note was dictated with voice recognition software. Similar sounding words can inadvertently be transcribed and may not be corrected upon review. Curt Bears, MD 01/19/2014

## 2014-01-16 ENCOUNTER — Encounter: Payer: Self-pay | Admitting: Radiation Oncology

## 2014-01-17 ENCOUNTER — Ambulatory Visit
Admission: RE | Admit: 2014-01-17 | Discharge: 2014-01-17 | Disposition: A | Discharge status: Home / Self Care | Payer: BC Managed Care – PPO | Source: Ambulatory Visit | Attending: Radiation Oncology | Admitting: Radiation Oncology

## 2014-01-17 DIAGNOSIS — C7949 Secondary malignant neoplasm of other parts of nervous system: Principal | ICD-10-CM

## 2014-01-17 DIAGNOSIS — C7931 Secondary malignant neoplasm of brain: Secondary | ICD-10-CM

## 2014-01-17 MED ORDER — GADOBENATE DIMEGLUMINE 529 MG/ML IV SOLN
10.0000 mL | Freq: Once | INTRAVENOUS | Status: AC | PRN
  Administered 2014-01-17: 10 mL via INTRAVENOUS

## 2014-01-18 NOTE — Patient Instructions (Signed)
Continue labs and chemotherapy as scheduled Followup in 2 weeks

## 2014-01-21 ENCOUNTER — Other Ambulatory Visit: Payer: BC Managed Care – PPO

## 2014-01-22 ENCOUNTER — Encounter: Payer: Self-pay | Admitting: Nutrition

## 2014-01-22 ENCOUNTER — Ambulatory Visit
Admission: RE | Admit: 2014-01-22 | Discharge: 2014-01-22 | Disposition: A | Discharge status: Home / Self Care | Payer: BC Managed Care – PPO | Source: Ambulatory Visit | Attending: Radiation Oncology | Admitting: Radiation Oncology

## 2014-01-22 ENCOUNTER — Other Ambulatory Visit (HOSPITAL_BASED_OUTPATIENT_CLINIC_OR_DEPARTMENT_OTHER): Payer: BC Managed Care – PPO

## 2014-01-22 ENCOUNTER — Encounter: Payer: Self-pay | Admitting: Radiation Oncology

## 2014-01-22 VITALS — BP 136/62 | HR 95 | Temp 98.0°F | Resp 16 | Wt 113.1 lb

## 2014-01-22 DIAGNOSIS — R5381 Other malaise: Secondary | ICD-10-CM

## 2014-01-22 DIAGNOSIS — C3410 Malignant neoplasm of upper lobe, unspecified bronchus or lung: Secondary | ICD-10-CM

## 2014-01-22 DIAGNOSIS — C7949 Secondary malignant neoplasm of other parts of nervous system: Secondary | ICD-10-CM

## 2014-01-22 DIAGNOSIS — C7931 Secondary malignant neoplasm of brain: Secondary | ICD-10-CM

## 2014-01-22 DIAGNOSIS — R5383 Other fatigue: Secondary | ICD-10-CM

## 2014-01-22 LAB — CBC WITH DIFFERENTIAL/PLATELET
BASO%: 0.3 % (ref 0.0–2.0)
Basophils Absolute: 0 10*3/uL (ref 0.0–0.1)
EOS%: 0.8 % (ref 0.0–7.0)
Eosinophils Absolute: 0 10*3/uL (ref 0.0–0.5)
HCT: 32.6 % — ABNORMAL LOW (ref 34.8–46.6)
HGB: 10.6 g/dL — ABNORMAL LOW (ref 11.6–15.9)
LYMPH%: 22.3 % (ref 14.0–49.7)
MCH: 30.5 pg (ref 25.1–34.0)
MCHC: 32.5 g/dL (ref 31.5–36.0)
MCV: 93.8 fL (ref 79.5–101.0)
MONO#: 0.6 10*3/uL (ref 0.1–0.9)
MONO%: 17.7 % — ABNORMAL HIGH (ref 0.0–14.0)
NEUT#: 2.1 10*3/uL (ref 1.5–6.5)
NEUT%: 58.9 % (ref 38.4–76.8)
Platelets: 183 10*3/uL (ref 145–400)
RBC: 3.48 10*6/uL — ABNORMAL LOW (ref 3.70–5.45)
RDW: 14.8 % — ABNORMAL HIGH (ref 11.2–14.5)
WBC: 3.5 10*3/uL — ABNORMAL LOW (ref 3.9–10.3)
lymph#: 0.8 10*3/uL — ABNORMAL LOW (ref 0.9–3.3)

## 2014-01-22 LAB — COMPREHENSIVE METABOLIC PANEL (CC13)
ALT: 11 U/L (ref 0–55)
AST: 9 U/L (ref 5–34)
Albumin: 3.3 g/dL — ABNORMAL LOW (ref 3.5–5.0)
Alkaline Phosphatase: 66 U/L (ref 40–150)
Anion Gap: 11 mEq/L (ref 3–11)
BUN: 8.9 mg/dL (ref 7.0–26.0)
CO2: 27 mEq/L (ref 22–29)
Calcium: 9.2 mg/dL (ref 8.4–10.4)
Chloride: 106 mEq/L (ref 98–109)
Creatinine: 0.6 mg/dL (ref 0.6–1.1)
Glucose: 114 mg/dl (ref 70–140)
Potassium: 3.6 mEq/L (ref 3.5–5.1)
Sodium: 144 mEq/L (ref 136–145)
Total Bilirubin: 0.23 mg/dL (ref 0.20–1.20)
Total Protein: 6.4 g/dL (ref 6.4–8.3)

## 2014-01-22 NOTE — Progress Notes (Signed)
Follow up 10/28/13-12/05/13  Rad tx lung, MRI brain results from 01/17/14 ready, patient feels great has gained over 5 lbs back, still has colostomy bag and wound still not completely healed stated patient will be going to [facility] today 1230pm for help wih colostomy bag, , appetite good, no nausea, no headaches, had chemothrapy Carboplatin and Almita 01/15/14, will have 2nd cycle this Monday coming, energy level good now, at times difficulty swallowing 10:54 AM

## 2014-01-22 NOTE — Progress Notes (Signed)
Radiation Oncology         [phone]) [phone] ________________________________  Name: Alexis [name] MRN: [id number]  Date: 01/22/2014  DOB: [birth year]  Follow-Up Visit Note  CC: [name], MD  [name], MD  Diagnosis:   Metastatic non-small cell lung cancer with a solitary brain metastasis  Interval Since Last Radiation:  6 weeks since completing palliative radiation treatment to the chest   Narrative:  The patient returns today for routine follow-up.  The patient states he is doing well. She continues to recover from her surgery and is very much wanting to have her colostomy reversed. She has discussed this with surgery and does understand the need for delay at this time. The patient is continuing with chemotherapy through Dr. Julien Nordmann. This has gone well so far. Patient has had some esophagitis but this has improved significantly. No consistent issues in terms of difficulty swallowing.  The patient denies any headaches, nausea on an ongoing basis. No vision changes. The patient had a recent MRI scan of the brain on 01/19/2012 and she presents today to discuss this.                              ALLERGIES:  is allergic to codeine.  Meds: Current Outpatient Prescriptions  Medication Sig Dispense Refill  . ALPRAZolam (XANAX) 0.5 MG tablet One four times a day as needed for nerves or pain  621 tablet  0  . folic acid (FOLVITE) 1 MG tablet Take 1 tablet (1 mg total) by mouth daily.  30 tablet  4  . HYDROcodone-acetaminophen (NORCO) 10-325 MG per tablet Take 1-2 tablets by mouth every 6 (six) hours as needed for moderate pain.  30 tablet  0  . ibuprofen (ADVIL,MOTRIN) 200 MG tablet You can take 2-3 tablets every 6 hours as needed for pain.  30 tablet  0  . Multiple Vitamin (MULTIVITAMIN WITH MINERALS) TABS tablet Take 1 daily with lunch and a meal.  Make sure it has iron for women.      Marland Kitchen omeprazole (PRILOSEC) 20 MG capsule Take 20 mg by mouth daily.      . ondansetron (ZOFRAN) 8 MG tablet  Take 1 tablet (8 mg total) by mouth every 8 (eight) hours as needed for nausea or vomiting.  30 tablet  0  . dexamethasone (DECADRON) 4 MG tablet 4 mg by mouth twice a day the day before, day of and day after the chemotherapy every 3 weeks  40 tablet  1  . emollient (BIAFINE) cream Apply 1 application topically daily. Apply daily after rad tx      . mirtazapine (REMERON) 30 MG tablet Take 1 tablet (30 mg total) by mouth at bedtime.  30 tablet  2  . potassium chloride SA (K-DUR,KLOR-CON) 20 MEQ tablet Take 1 tablet (20 mEq total) by mouth daily.  7 tablet  0  . prochlorperazine (COMPAZINE) 10 MG tablet Take 1 tablet (10 mg total) by mouth every 6 (six) hours as needed for nausea or vomiting.  60 tablet  0   No current facility-administered medications for this encounter.    Physical Findings: The patient is in no acute distress. Patient is alert and oriented.  weight is 113 lb 1.6 oz (51.302 kg). Her oral temperature is 98 F (36.7 C). Her blood pressure is 136/62 and her pulse is 95. Her respiration is 16 and oxygen saturation is 100%. .   General: Well-developed, in no acute  distress HEENT: Normocephalic, atraumatic Cardiovascular: Regular rate and rhythm Respiratory: Clear to auscultation bilaterally GI: Soft, nontender, normal bowel sounds Extremities: No edema present   Lab Findings: Lab Results  Component Value Date   WBC 3.5* 01/22/2014   HGB 10.6* 01/22/2014   HCT 32.6* 01/22/2014   MCV 93.8 01/22/2014   PLT 183 01/22/2014     Radiographic Findings: Mr [name] Wo Contrast  01/17/2014   CLINICAL DATA:  63 year old female with metastatic lung cancer status post SRS treatment of brain metastasis. Restaging. Subsequent encounter.  EXAM: MRI HEAD WITHOUT AND WITH CONTRAST  TECHNIQUE: Multiplanar, multiecho pulse sequences of the brain and surrounding structures were obtained without and with intravenous contrast.  CONTRAST:  63mL MULTIHANCE GADOBENATE DIMEGLUMINE 529 MG/ML IV SOLN   COMPARISON:  Pretreatment study 10/08/2013.  FINDINGS: Right parietal lobe metastasis re- identified and stable in overall size. The lesion shows less central enhancement today (series 10, image 106 versus series 11, image 124 previously).  No new or additional brain metastasis identified.  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  Decreased mild cerebral edema surrounding the right parietal lesion. Stable nonspecific T2 and FLAIR hyperintensity elsewhere in the cerebral white matter. Major intracranial vascular flow voids are stable. No restricted diffusion or evidence of acute infarction. Probable small left parietal scalp convexity sebaceous cyst is unchanged. No acute intracranial hemorrhage identified. No ventriculomegaly. No intracranial mass effect. Negative pituitary and cervicomedullary junction. Grossly negative visualized cervical spine. Stable bone marrow signal. Visualized orbit soft tissues are within normal limits. Visible internal auditory structures appear normal. Stable minor paranasal sinus mucosal thickening. Mastoids are clear. Visualized scalp soft tissues are within normal limits.  IMPRESSION: 1. Stable treated small, solitary right parietal lobe brain metastasis. Associated mild cerebral edema has decreased. 2. No new brain metastasis or new intracranial abnormality identified.   Electronically Signed   By: Lars Pinks M.D.   On: 01/17/2014 14:18    Impression:    The patient is doing well at this time clinically and is proceeding with palliative chemotherapy. The patient's MRI scan has been personally reviewed and this looked good. Stable treated lesion with decreased edema and no new areas of concern.  Plan:  The patient will undergo a repeat MRI scan of the brain in 3 months. This probably we'll make sense to have her seen by neurosurgery after this visit and we can probably have her visit with them in a couple of weeks moved back  since she is seeing [place] today. We had originally discussed possible surgery with regards to the tumor within the chest. I discussed with the patient and her husband that I believe that a final decision can be made regarding this after she undergoes repeat imaging after her last cycle of planned chemotherapy. It would make sense at that time to further discuss this with Dr. Roxan Hockey depending on the results.   Jodelle Gross, M.D., Ph.D.

## 2014-01-22 NOTE — Progress Notes (Signed)
Patient cancelled nutrition follow up appointment on June 4.

## 2014-01-23 ENCOUNTER — Other Ambulatory Visit: Payer: Self-pay | Admitting: Radiation Therapy

## 2014-01-23 ENCOUNTER — Ambulatory Visit: Payer: BC Managed Care – PPO | Admitting: Radiation Oncology

## 2014-01-23 DIAGNOSIS — C7949 Secondary malignant neoplasm of other parts of nervous system: Principal | ICD-10-CM

## 2014-01-23 DIAGNOSIS — C7931 Secondary malignant neoplasm of brain: Secondary | ICD-10-CM

## 2014-01-27 ENCOUNTER — Encounter: Payer: BC Managed Care – PPO | Admitting: Nutrition

## 2014-01-27 ENCOUNTER — Encounter: Payer: Self-pay | Admitting: Physician Assistant

## 2014-01-27 ENCOUNTER — Ambulatory Visit: Payer: BC Managed Care – PPO

## 2014-01-27 ENCOUNTER — Ambulatory Visit (HOSPITAL_BASED_OUTPATIENT_CLINIC_OR_DEPARTMENT_OTHER): Payer: BC Managed Care – PPO

## 2014-01-27 ENCOUNTER — Other Ambulatory Visit: Payer: BC Managed Care – PPO

## 2014-01-27 ENCOUNTER — Ambulatory Visit (HOSPITAL_BASED_OUTPATIENT_CLINIC_OR_DEPARTMENT_OTHER): Payer: BC Managed Care – PPO | Admitting: Physician Assistant

## 2014-01-27 ENCOUNTER — Other Ambulatory Visit (HOSPITAL_BASED_OUTPATIENT_CLINIC_OR_DEPARTMENT_OTHER): Payer: BC Managed Care – PPO

## 2014-01-27 ENCOUNTER — Telehealth: Payer: Self-pay | Admitting: Internal Medicine

## 2014-01-27 ENCOUNTER — Other Ambulatory Visit: Payer: Self-pay | Admitting: Oncology

## 2014-01-27 VITALS — BP 132/72 | HR 110 | Temp 98.8°F | Resp 18 | Ht 64.0 in | Wt 109.7 lb

## 2014-01-27 DIAGNOSIS — C3410 Malignant neoplasm of upper lobe, unspecified bronchus or lung: Secondary | ICD-10-CM

## 2014-01-27 DIAGNOSIS — C7931 Secondary malignant neoplasm of brain: Secondary | ICD-10-CM

## 2014-01-27 DIAGNOSIS — C7949 Secondary malignant neoplasm of other parts of nervous system: Secondary | ICD-10-CM

## 2014-01-27 DIAGNOSIS — F329 Major depressive disorder, single episode, unspecified: Secondary | ICD-10-CM

## 2014-01-27 DIAGNOSIS — Z5111 Encounter for antineoplastic chemotherapy: Secondary | ICD-10-CM

## 2014-01-27 DIAGNOSIS — F3289 Other specified depressive episodes: Secondary | ICD-10-CM

## 2014-01-27 LAB — CBC WITH DIFFERENTIAL/PLATELET
BASO%: 0 % (ref 0.0–2.0)
Basophils Absolute: 0 10*3/uL (ref 0.0–0.1)
EOS%: 0 % (ref 0.0–7.0)
Eosinophils Absolute: 0 10*3/uL (ref 0.0–0.5)
HCT: 37.5 % (ref 34.8–46.6)
HGB: 12.2 g/dL (ref 11.6–15.9)
LYMPH%: 5.9 % — ABNORMAL LOW (ref 14.0–49.7)
MCH: 30.5 pg (ref 25.1–34.0)
MCHC: 32.5 g/dL (ref 31.5–36.0)
MCV: 93.8 fL (ref 79.5–101.0)
MONO#: 0.3 10*3/uL (ref 0.1–0.9)
MONO%: 5.2 % (ref 0.0–14.0)
NEUT#: 5.9 10*3/uL (ref 1.5–6.5)
NEUT%: 88.9 % — ABNORMAL HIGH (ref 38.4–76.8)
Platelets: 461 10*3/uL — ABNORMAL HIGH (ref 145–400)
RBC: 4 10*6/uL (ref 3.70–5.45)
RDW: 15.7 % — ABNORMAL HIGH (ref 11.2–14.5)
WBC: 6.6 10*3/uL (ref 3.9–10.3)
lymph#: 0.4 10*3/uL — ABNORMAL LOW (ref 0.9–3.3)

## 2014-01-27 LAB — COMPREHENSIVE METABOLIC PANEL (CC13)
ALT: 11 U/L (ref 0–55)
AST: 9 U/L (ref 5–34)
Albumin: 3.8 g/dL (ref 3.5–5.0)
Alkaline Phosphatase: 79 U/L (ref 40–150)
Anion Gap: 15 mEq/L — ABNORMAL HIGH (ref 3–11)
BUN: 16.2 mg/dL (ref 7.0–26.0)
CO2: 22 mEq/L (ref 22–29)
Calcium: 10 mg/dL (ref 8.4–10.4)
Chloride: 103 mEq/L (ref 98–109)
Creatinine: 0.8 mg/dL (ref 0.6–1.1)
Glucose: 145 mg/dl — ABNORMAL HIGH (ref 70–140)
Potassium: 4.2 mEq/L (ref 3.5–5.1)
Sodium: 140 mEq/L (ref 136–145)
Total Bilirubin: 0.21 mg/dL (ref 0.20–1.20)
Total Protein: 7.5 g/dL (ref 6.4–8.3)

## 2014-01-27 MED ORDER — SODIUM CHLORIDE 0.9 % IV SOLN
500.0000 mg/m2 | Freq: Once | INTRAVENOUS | Status: AC
  Administered 2014-01-27: 750 mg via INTRAVENOUS
  Filled 2014-01-27: qty 30

## 2014-01-27 MED ORDER — DEXAMETHASONE SODIUM PHOSPHATE 20 MG/5ML IJ SOLN
INTRAMUSCULAR | Status: AC
  Filled 2014-01-27: qty 5

## 2014-01-27 MED ORDER — ONDANSETRON 16 MG/50ML IVPB (CHCC)
INTRAVENOUS | Status: AC
  Filled 2014-01-27: qty 16

## 2014-01-27 MED ORDER — DEXAMETHASONE SODIUM PHOSPHATE 20 MG/5ML IJ SOLN
20.0000 mg | Freq: Once | INTRAMUSCULAR | Status: AC
  Administered 2014-01-27: 20 mg via INTRAVENOUS

## 2014-01-27 MED ORDER — SODIUM CHLORIDE 0.9 % IV SOLN
Freq: Once | INTRAVENOUS | Status: AC
  Administered 2014-01-27: 11:00:00 via INTRAVENOUS

## 2014-01-27 MED ORDER — SODIUM CHLORIDE 0.9 % IV SOLN
410.0000 mg | Freq: Once | INTRAVENOUS | Status: AC
  Administered 2014-01-27: 410 mg via INTRAVENOUS
  Filled 2014-01-27: qty 41

## 2014-01-27 MED ORDER — ONDANSETRON 16 MG/50ML IVPB (CHCC)
16.0000 mg | Freq: Once | INTRAVENOUS | Status: AC
  Administered 2014-01-27: 16 mg via INTRAVENOUS

## 2014-01-27 NOTE — Telephone Encounter (Signed)
, °

## 2014-01-27 NOTE — Progress Notes (Signed)
[facility] [phone]   Fax:(336) [place], MD [address] [place] [address]  DIAGNOSIS: Stage IV (T2a, N0, M1b) non-small cell lung cancer consistent with adenocarcinoma with negative EGFR mutation and negative ALK gene translocation diagnosed in January of 2015 presented with right upper lobe lung mass in addition to a solitary brain metastasis status post stereotactic to a solitary brain metastasis as well as radiotherapy to the right upper lobe lung mass.  PRIOR THERAPY:  1) Status post stereotactic radiotherapy to a solitary right parietal brain lesion under the care of Dr. [name] on 10/16/2013. 2) Status post palliative radiotherapy to the right lung tumor under the care of Dr. [name] completed on 12/05/2013.  CURRENT THERAPY: Systemic chemotherapy with carboplatin for AUC of 5 and Alimta 500 mg/M2 every 3 weeks. First dose Jan 06 2014. Status post 1 cycle  INTERVAL HISTORY: ASA FATH 63 y.o. female returns to the clinic today for followup visit accompanied by her husband. The patient is feeling fine today with no specific complaints except for fatigue. She presents for symptom management visit prior to starting her second cycle of chemotherapy with carboplatin and Alimta. She reports that she recently saw an ostomy nurse and felt that this was a good encounter. She has a followup appointment with Dr. Barkley Bruns on 02/19/2014. She will also followup with the ostomy nurse to learn how to change her ostomy bags herself. Her husband has been taking care of changing them since her ostomy has been in place. She recently saw Dr. [name] and had a good report regarding her solitary brain met after SRS. She states that there was good shrinkage as well as no new lesions. She had some generalized malaise and some nausea after the chemotherapy. Nausea was well-controlled with her current antiemetics. She complains of her head  pounding but denies any vision changes. She does admit to not drinking enough water or fluids as she should.  The patient denied having any significant fever or chills. She has no vomiting. She denied having any significant chest pain but continues to have shortness breath with exertion with no cough or hemoptysis.   MEDICAL HISTORY: Past Medical History  Diagnosis Date  . Cancer     lung, hx cervicle  . Ileus, postoperative 11/18/2013  . Physical deconditioning 11/18/2013  . Severe protein-calorie malnutrition 11/18/2013  . Anemia 11/18/2013  . History of radiation therapy 10-28-13- 12-05-13    lung ca 50 Gy/62f    ALLERGIES:  is allergic to codeine.  MEDICATIONS:  Current Outpatient Prescriptions  Medication Sig Dispense Refill  . ALPRAZolam (XANAX) 0.5 MG tablet One four times a day as needed for nerves or pain  120 tablet  0  . dexamethasone (DECADRON) 4 MG tablet 4 mg by mouth twice a day the day before, day of and day after the chemotherapy every 3 weeks  40 tablet  1  . emollient (BIAFINE) cream Apply 1 application topically daily. Apply daily after rad tx      . HYDROcodone-acetaminophen (NORCO) 10-325 MG per tablet Take 1-2 tablets by mouth every 6 (six) hours as needed for moderate pain.  30 tablet  0  . mirtazapine (REMERON) 30 MG tablet Take 1 tablet (30 mg total) by mouth at bedtime.  30 tablet  2  . Multiple Vitamin (MULTIVITAMIN WITH MINERALS) TABS tablet Take 1 daily with lunch and a meal.  Make sure it has iron  for women.      Marland Kitchen omeprazole (PRILOSEC) 20 MG capsule Take 20 mg by mouth daily.      . ondansetron (ZOFRAN) 8 MG tablet Take 1 tablet (8 mg total) by mouth every 8 (eight) hours as needed for nausea or vomiting.  30 tablet  0  . folic acid (FOLVITE) 1 MG tablet Take 1 tablet (1 mg total) by mouth daily.  30 tablet  4  . ibuprofen (ADVIL,MOTRIN) 200 MG tablet You can take 2-3 tablets every 6 hours as needed for pain.  30 tablet  0  . potassium chloride SA  (K-DUR,KLOR-CON) 20 MEQ tablet Take 1 tablet (20 mEq total) by mouth daily.  7 tablet  0  . prochlorperazine (COMPAZINE) 10 MG tablet Take 1 tablet (10 mg total) by mouth every 6 (six) hours as needed for nausea or vomiting.  60 tablet  0   No current facility-administered medications for this visit.    SURGICAL HISTORY:  Past Surgical History  Procedure Laterality Date  . Video bronchoscopy Bilateral 08/30/2013    Procedure: VIDEO BRONCHOSCOPY WITH FLUORO;  Surgeon: Tanda Rockers, MD;  Location: [facility];  Service: Cardiopulmonary;  Laterality: Bilateral;  . Abdominal hysterectomy    . Laparotomy N/A 11/03/2013    Procedure: EXPLORATORY LAPAROTOMY, DRAINAGE OF INTRA  ABDOMINAL ABSCESSES, MOBILIZATION OF SPLENIC FLEXURE, SIGMOID COLECTOMY WITH COLOSTOMY;  Surgeon: Odis Hollingshead, MD;  Location: [facility];  Service: General;  Laterality: N/A;    REVIEW OF SYSTEMS:  Constitutional: positive for fatigue Eyes: negative Ears, nose, mouth, throat, and face: negative Respiratory: negative Cardiovascular: negative Gastrointestinal: positive for nausea Genitourinary:negative Integument/breast: negative Hematologic/lymphatic: negative Musculoskeletal:negative Neurological: negative Behavioral/Psych: positive for depression Endocrine: negative Allergic/Immunologic: negative   PHYSICAL EXAMINATION: General appearance: alert, cooperative, fatigued and no distress Head: Normocephalic, without obvious abnormality, atraumatic Neck: no adenopathy, no JVD, supple, symmetrical, trachea midline and thyroid not enlarged, symmetric, no tenderness/mass/nodules Lymph nodes: Cervical, supraclavicular, and axillary nodes normal. Resp: clear to auscultation bilaterally Back: symmetric, no curvature. ROM normal. No CVA tenderness. Cardio: regular rate and rhythm, S1, S2 normal, no murmur, click, rub or gallop GI: soft, non-tender; bowel sounds normal; no masses,  no organomegaly Extremities: extremities  normal, atraumatic, no cyanosis or edema Neurologic: Alert and oriented X 3, normal strength and tone. Normal symmetric reflexes. Normal coordination and gait  ECOG PERFORMANCE STATUS: 2 - Symptomatic, <50% confined to bed  Blood pressure 132/72, pulse 110, temperature 98.8 F (37.1 C), temperature source Oral, resp. rate 18, height 5' 4"  (1.626 m), weight 109 lb 11.2 oz (49.76 kg).  LABORATORY DATA: Lab Results  Component Value Date   WBC 6.6 01/27/2014   HGB 12.2 01/27/2014   HCT 37.5 01/27/2014   MCV 93.8 01/27/2014   PLT 461* 01/27/2014      Chemistry      Component Value Date/Time   NA 140 01/27/2014 0943   NA 138 11/16/2013 0530   K 4.2 01/27/2014 0943   K 3.5* 11/16/2013 0530   CL 100 11/16/2013 0530   CO2 22 01/27/2014 0943   CO2 28 11/16/2013 0530   BUN 16.2 01/27/2014 0943   BUN 13 11/16/2013 0530   CREATININE 0.8 01/27/2014 0943   CREATININE 0.48* 11/16/2013 0530      Component Value Date/Time   CALCIUM 10.0 01/27/2014 0943   CALCIUM 8.6 11/16/2013 0530   ALKPHOS 79 01/27/2014 0943   ALKPHOS 145* 11/16/2013 0530   AST 9 01/27/2014 0943   AST 21 11/16/2013  0530   ALT 11 01/27/2014 0943   ALT 36* 11/16/2013 0530   BILITOT 0.21 01/27/2014 0943   BILITOT 0.2* 11/16/2013 0530       RADIOGRAPHIC STUDIES: Mr Kizzie Fantasia Contrast  07-Feb-2014   CLINICAL DATA:  63 year old female with metastatic lung cancer status post SRS treatment of brain metastasis. Restaging. Subsequent encounter.  EXAM: MRI HEAD WITHOUT AND WITH CONTRAST  TECHNIQUE: Multiplanar, multiecho pulse sequences of the brain and surrounding structures were obtained without and with intravenous contrast.  CONTRAST:  70m MULTIHANCE GADOBENATE DIMEGLUMINE 529 MG/ML IV SOLN  COMPARISON:  Pretreatment study 10/08/2013.  FINDINGS: Right parietal lobe metastasis re- identified and stable in overall size. The lesion shows less central enhancement today (series 10, image 106 versus series 11, image 124 previously).  No new or additional brain  metastasis identified.  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  Decreased mild cerebral edema surrounding the right parietal lesion. Stable nonspecific T2 and FLAIR hyperintensity elsewhere in the cerebral white matter. Major intracranial vascular flow voids are stable. No restricted diffusion or evidence of acute infarction. Probable small left parietal scalp convexity sebaceous cyst is unchanged. No acute intracranial hemorrhage identified. No ventriculomegaly. No intracranial mass effect. Negative pituitary and cervicomedullary junction. Grossly negative visualized cervical spine. Stable bone marrow signal. Visualized orbit soft tissues are within normal limits. Visible internal auditory structures appear normal. Stable minor paranasal sinus mucosal thickening. Mastoids are clear. Visualized scalp soft tissues are within normal limits.  IMPRESSION: 1. Stable treated small, solitary right parietal lobe brain metastasis. Associated mild cerebral edema has decreased. 2. No new brain metastasis or new intracranial abnormality identified.   Electronically Signed   By: LLars PinksM.D.   On: 0June 12, 201514:18   ASSESSMENT AND PLAN: This is a very pleasant 63years old white female recently diagnosed with a stage IV non-small cell lung cancer, adenocarcinoma with negative EGFR mutation and negative ALK gene translocation. The patient is status post stereotactic radiotherapy to a solitary brain metastases in addition to palliative radiotherapy to the right upper lobe lung mass.  Her recent MRI of the brain revealed stable treated small solitary right parietal lobe or in metastasis with associated mild cerebral edema which is decreased. There were no new brain metastatic or intracranial abnormalities identified. She is status post one cycle of systemic chemotherapy with carboplatin for AUC of 5 and Alimta 500 mg/M2 every 3 weeks. She'll proceed with cycle #2 of her  systemic chemotherapy with carboplatin for an AUC of 5 and Alimta 500 mg per meter squared given every 3 weeks. For depression, the patient will continue on Remeron 30 mg by mouth each bedtime. She will followup in 3 weeks for another symptom management prior to the start of cycle #3.   The patient was advised to call immediately if she has any concerning symptoms in the interval.  The patient voices understanding of current disease status and treatment options and is in agreement with the current care plan.  All questions were answered. The patient knows to call the clinic with any problems, questions or concerns. We can certainly see the patient much sooner if necessary.  ACarlton Adam PA-C    Disclaimer: This note was dictated with voice recognition software. Similar sounding words can inadvertently be transcribed and may not be corrected upon review.

## 2014-01-27 NOTE — Patient Instructions (Signed)
[facility] Discharge Instructions for Patients Receiving Chemotherapy  Today you received the following chemotherapy agents :  Alimta,  Carboplatin.  To help prevent nausea and vomiting after your treatment, we encourage you to take your nausea medication as prescribed by your physician.  Take Compazine 10mg  by mouth every 6 hours as needed for nausea.  DO NOT Drive after taking this med as it can cause drowsiness.   If you develop nausea and vomiting that is not controlled by your nausea medication, call the clinic.   BELOW ARE SYMPTOMS THAT SHOULD BE REPORTED IMMEDIATELY:  *FEVER GREATER THAN 100.5 F  *CHILLS WITH OR WITHOUT FEVER  NAUSEA AND VOMITING THAT IS NOT CONTROLLED WITH YOUR NAUSEA MEDICATION  *UNUSUAL SHORTNESS OF BREATH  *UNUSUAL BRUISING OR BLEEDING  TENDERNESS IN MOUTH AND THROAT WITH OR WITHOUT PRESENCE OF ULCERS  *URINARY PROBLEMS  *BOWEL PROBLEMS  UNUSUAL RASH Items with * indicate a potential emergency and should be followed up as soon as possible.  Feel free to call the clinic you have any questions or concerns. The clinic phone number is [phone].

## 2014-01-28 NOTE — Patient Instructions (Signed)
Continue weekly labs as scheduled Followup in 3 weeks prior to next scheduled cycle of chemotherapy

## 2014-01-30 ENCOUNTER — Encounter: Payer: BC Managed Care – PPO | Admitting: Nutrition

## 2014-02-03 ENCOUNTER — Other Ambulatory Visit (HOSPITAL_BASED_OUTPATIENT_CLINIC_OR_DEPARTMENT_OTHER): Payer: BC Managed Care – PPO

## 2014-02-03 DIAGNOSIS — C7949 Secondary malignant neoplasm of other parts of nervous system: Secondary | ICD-10-CM

## 2014-02-03 DIAGNOSIS — C7931 Secondary malignant neoplasm of brain: Secondary | ICD-10-CM

## 2014-02-03 DIAGNOSIS — C3410 Malignant neoplasm of upper lobe, unspecified bronchus or lung: Secondary | ICD-10-CM

## 2014-02-03 LAB — CBC WITH DIFFERENTIAL/PLATELET
BASO%: 0.7 % (ref 0.0–2.0)
Basophils Absolute: 0 10*3/uL (ref 0.0–0.1)
EOS%: 1.8 % (ref 0.0–7.0)
Eosinophils Absolute: 0.1 10*3/uL (ref 0.0–0.5)
HCT: 37 % (ref 34.8–46.6)
HGB: 12.1 g/dL (ref 11.6–15.9)
LYMPH%: 28.2 % (ref 14.0–49.7)
MCH: 30.8 pg (ref 25.1–34.0)
MCHC: 32.7 g/dL (ref 31.5–36.0)
MCV: 94.3 fL (ref 79.5–101.0)
MONO#: 0.4 10*3/uL (ref 0.1–0.9)
MONO%: 10.5 % (ref 0.0–14.0)
NEUT#: 2.3 10*3/uL (ref 1.5–6.5)
NEUT%: 58.8 % (ref 38.4–76.8)
Platelets: 361 10*3/uL (ref 145–400)
RBC: 3.92 10*6/uL (ref 3.70–5.45)
RDW: 16 % — ABNORMAL HIGH (ref 11.2–14.5)
WBC: 3.8 10*3/uL — ABNORMAL LOW (ref 3.9–10.3)
lymph#: 1.1 10*3/uL (ref 0.9–3.3)

## 2014-02-03 LAB — COMPREHENSIVE METABOLIC PANEL (CC13)
ALT: 20 U/L (ref 0–55)
AST: 14 U/L (ref 5–34)
Albumin: 3.5 g/dL (ref 3.5–5.0)
Alkaline Phosphatase: 74 U/L (ref 40–150)
Anion Gap: 10 mEq/L (ref 3–11)
BUN: 12.6 mg/dL (ref 7.0–26.0)
CO2: 27 mEq/L (ref 22–29)
Calcium: 9.3 mg/dL (ref 8.4–10.4)
Chloride: 103 mEq/L (ref 98–109)
Creatinine: 0.7 mg/dL (ref 0.6–1.1)
Glucose: 103 mg/dl (ref 70–140)
Potassium: 4.2 mEq/L (ref 3.5–5.1)
Sodium: 139 mEq/L (ref 136–145)
Total Bilirubin: 0.27 mg/dL (ref 0.20–1.20)
Total Protein: 7 g/dL (ref 6.4–8.3)

## 2014-02-10 ENCOUNTER — Other Ambulatory Visit (HOSPITAL_BASED_OUTPATIENT_CLINIC_OR_DEPARTMENT_OTHER): Payer: BC Managed Care – PPO

## 2014-02-10 DIAGNOSIS — C7931 Secondary malignant neoplasm of brain: Secondary | ICD-10-CM

## 2014-02-10 DIAGNOSIS — C3410 Malignant neoplasm of upper lobe, unspecified bronchus or lung: Secondary | ICD-10-CM

## 2014-02-10 DIAGNOSIS — C7949 Secondary malignant neoplasm of other parts of nervous system: Secondary | ICD-10-CM

## 2014-02-10 LAB — CBC WITH DIFFERENTIAL/PLATELET
BASO%: 0.1 % (ref 0.0–2.0)
Basophils Absolute: 0 10*3/uL (ref 0.0–0.1)
EOS%: 0.4 % (ref 0.0–7.0)
Eosinophils Absolute: 0 10*3/uL (ref 0.0–0.5)
HCT: 35.7 % (ref 34.8–46.6)
HGB: 11.8 g/dL (ref 11.6–15.9)
LYMPH%: 17.4 % (ref 14.0–49.7)
MCH: 30.7 pg (ref 25.1–34.0)
MCHC: 33.1 g/dL (ref 31.5–36.0)
MCV: 92.8 fL (ref 79.5–101.0)
MONO#: 0.8 10*3/uL (ref 0.1–0.9)
MONO%: 16.1 % — ABNORMAL HIGH (ref 0.0–14.0)
NEUT#: 3.2 10*3/uL (ref 1.5–6.5)
NEUT%: 66 % (ref 38.4–76.8)
Platelets: 155 10*3/uL (ref 145–400)
RBC: 3.85 10*6/uL (ref 3.70–5.45)
RDW: 15.4 % — ABNORMAL HIGH (ref 11.2–14.5)
WBC: 4.8 10*3/uL (ref 3.9–10.3)
lymph#: 0.8 10*3/uL — ABNORMAL LOW (ref 0.9–3.3)

## 2014-02-10 LAB — COMPREHENSIVE METABOLIC PANEL (CC13)
ALT: 29 U/L (ref 0–55)
AST: 17 U/L (ref 5–34)
Albumin: 3.7 g/dL (ref 3.5–5.0)
Alkaline Phosphatase: 78 U/L (ref 40–150)
Anion Gap: 10 mEq/L (ref 3–11)
BUN: 11.4 mg/dL (ref 7.0–26.0)
CO2: 28 mEq/L (ref 22–29)
Calcium: 9.7 mg/dL (ref 8.4–10.4)
Chloride: 105 mEq/L (ref 98–109)
Creatinine: 0.7 mg/dL (ref 0.6–1.1)
Glucose: 86 mg/dl (ref 70–140)
Potassium: 4.3 mEq/L (ref 3.5–5.1)
Sodium: 142 mEq/L (ref 136–145)
Total Bilirubin: 0.21 mg/dL (ref 0.20–1.20)
Total Protein: 7.1 g/dL (ref 6.4–8.3)

## 2014-02-17 ENCOUNTER — Ambulatory Visit (HOSPITAL_BASED_OUTPATIENT_CLINIC_OR_DEPARTMENT_OTHER): Payer: BC Managed Care – PPO

## 2014-02-17 ENCOUNTER — Telehealth: Payer: Self-pay | Admitting: *Deleted

## 2014-02-17 ENCOUNTER — Other Ambulatory Visit (HOSPITAL_BASED_OUTPATIENT_CLINIC_OR_DEPARTMENT_OTHER): Payer: BC Managed Care – PPO

## 2014-02-17 ENCOUNTER — Ambulatory Visit (HOSPITAL_BASED_OUTPATIENT_CLINIC_OR_DEPARTMENT_OTHER): Payer: BC Managed Care – PPO | Admitting: Internal Medicine

## 2014-02-17 ENCOUNTER — Telehealth: Payer: Self-pay | Admitting: Internal Medicine

## 2014-02-17 ENCOUNTER — Encounter: Payer: Self-pay | Admitting: Internal Medicine

## 2014-02-17 ENCOUNTER — Ambulatory Visit: Payer: BC Managed Care – PPO | Admitting: Nutrition

## 2014-02-17 VITALS — BP 114/94 | HR 85 | Temp 98.4°F | Resp 18 | Ht 64.0 in | Wt 115.6 lb

## 2014-02-17 DIAGNOSIS — C3410 Malignant neoplasm of upper lobe, unspecified bronchus or lung: Secondary | ICD-10-CM

## 2014-02-17 DIAGNOSIS — Z5111 Encounter for antineoplastic chemotherapy: Secondary | ICD-10-CM

## 2014-02-17 DIAGNOSIS — C7931 Secondary malignant neoplasm of brain: Secondary | ICD-10-CM

## 2014-02-17 DIAGNOSIS — C7949 Secondary malignant neoplasm of other parts of nervous system: Secondary | ICD-10-CM

## 2014-02-17 DIAGNOSIS — F329 Major depressive disorder, single episode, unspecified: Secondary | ICD-10-CM

## 2014-02-17 DIAGNOSIS — F3289 Other specified depressive episodes: Secondary | ICD-10-CM

## 2014-02-17 LAB — COMPREHENSIVE METABOLIC PANEL (CC13)
ALT: 13 U/L (ref 0–55)
AST: 11 U/L (ref 5–34)
Albumin: 3.8 g/dL (ref 3.5–5.0)
Alkaline Phosphatase: 67 U/L (ref 40–150)
Anion Gap: 11 mEq/L (ref 3–11)
BUN: 12.8 mg/dL (ref 7.0–26.0)
CO2: 25 mEq/L (ref 22–29)
Calcium: 9.9 mg/dL (ref 8.4–10.4)
Chloride: 105 mEq/L (ref 98–109)
Creatinine: 0.7 mg/dL (ref 0.6–1.1)
Glucose: 136 mg/dl (ref 70–140)
Potassium: 3.5 mEq/L (ref 3.5–5.1)
Sodium: 141 mEq/L (ref 136–145)
Total Bilirubin: 0.27 mg/dL (ref 0.20–1.20)
Total Protein: 7.2 g/dL (ref 6.4–8.3)

## 2014-02-17 LAB — CBC WITH DIFFERENTIAL/PLATELET
BASO%: 0.2 % (ref 0.0–2.0)
Basophils Absolute: 0 10*3/uL (ref 0.0–0.1)
EOS%: 0 % (ref 0.0–7.0)
Eosinophils Absolute: 0 10*3/uL (ref 0.0–0.5)
HCT: 35.7 % (ref 34.8–46.6)
HGB: 11.5 g/dL — ABNORMAL LOW (ref 11.6–15.9)
LYMPH%: 11.9 % — ABNORMAL LOW (ref 14.0–49.7)
MCH: 30.3 pg (ref 25.1–34.0)
MCHC: 32.2 g/dL (ref 31.5–36.0)
MCV: 94.2 fL (ref 79.5–101.0)
MONO#: 1 10*3/uL — ABNORMAL HIGH (ref 0.1–0.9)
MONO%: 15.8 % — ABNORMAL HIGH (ref 0.0–14.0)
NEUT#: 4.6 10*3/uL (ref 1.5–6.5)
NEUT%: 72.1 % (ref 38.4–76.8)
Platelets: 347 10*3/uL (ref 145–400)
RBC: 3.79 10*6/uL (ref 3.70–5.45)
RDW: 16.3 % — ABNORMAL HIGH (ref 11.2–14.5)
WBC: 6.3 10*3/uL (ref 3.9–10.3)
lymph#: 0.8 10*3/uL — ABNORMAL LOW (ref 0.9–3.3)

## 2014-02-17 MED ORDER — ONDANSETRON 16 MG/50ML IVPB (CHCC)
INTRAVENOUS | Status: AC
  Filled 2014-02-17: qty 16

## 2014-02-17 MED ORDER — CARBOPLATIN CHEMO INJECTION 600 MG/60ML
406.0000 mg | Freq: Once | INTRAVENOUS | Status: AC
  Administered 2014-02-17: 410 mg via INTRAVENOUS
  Filled 2014-02-17: qty 41

## 2014-02-17 MED ORDER — ONDANSETRON 16 MG/50ML IVPB (CHCC)
16.0000 mg | Freq: Once | INTRAVENOUS | Status: AC
  Administered 2014-02-17: 16 mg via INTRAVENOUS

## 2014-02-17 MED ORDER — DEXAMETHASONE SODIUM PHOSPHATE 20 MG/5ML IJ SOLN
20.0000 mg | Freq: Once | INTRAMUSCULAR | Status: AC
  Administered 2014-02-17: 20 mg via INTRAVENOUS

## 2014-02-17 MED ORDER — CYANOCOBALAMIN 1000 MCG/ML IJ SOLN
1000.0000 ug | Freq: Once | INTRAMUSCULAR | Status: AC
  Administered 2014-02-17: 1000 ug via INTRAMUSCULAR

## 2014-02-17 MED ORDER — DEXAMETHASONE SODIUM PHOSPHATE 20 MG/5ML IJ SOLN
INTRAMUSCULAR | Status: AC
  Filled 2014-02-17: qty 5

## 2014-02-17 MED ORDER — SODIUM CHLORIDE 0.9 % IV SOLN
Freq: Once | INTRAVENOUS | Status: AC
  Administered 2014-02-17: 11:00:00 via INTRAVENOUS

## 2014-02-17 MED ORDER — CYANOCOBALAMIN 1000 MCG/ML IJ SOLN
INTRAMUSCULAR | Status: AC
  Filled 2014-02-17: qty 1

## 2014-02-17 MED ORDER — SODIUM CHLORIDE 0.9 % IV SOLN
500.0000 mg/m2 | Freq: Once | INTRAVENOUS | Status: AC
  Administered 2014-02-17: 750 mg via INTRAVENOUS
  Filled 2014-02-17: qty 30

## 2014-02-17 NOTE — Telephone Encounter (Signed)
Per staff message and POF I have scheduled appts. Advised scheduler of appts. JMW  

## 2014-02-17 NOTE — Progress Notes (Signed)
[facility] Telephone:(336) [phone]   Fax:(336) [place], MD [address] [place] [address]  DIAGNOSIS: Stage IV (T2a, N0, M1b) non-small cell lung cancer consistent with adenocarcinoma with negative EGFR mutation and negative ALK gene translocation diagnosed in January of 2015 presented with right upper lobe lung mass in addition to a solitary brain metastasis status post stereotactic to a solitary brain metastasis as well as radiotherapy to the right upper lobe lung mass.  PRIOR THERAPY:  1) Status post stereotactic radiotherapy to a solitary right parietal brain lesion under the care of Dr. [name] on 10/16/2013. 2) Status post palliative radiotherapy to the right lung tumor under the care of Dr. [name] completed on 12/05/2013.  CURRENT THERAPY: Systemic chemotherapy with carboplatin for AUC of 5 and Alimta 500 mg/M2 every 3 weeks. First dose Jan 06 2014. Status post 2 cycles.  INTERVAL HISTORY: Alexis [name] 63 y.o. female returns to the clinic today for followup visit accompanied by her husband. The patient tolerated the last cycle of her systemic chemotherapy with carboplatin and Alimta fairly well. She denied having any significant chest pain, shortness breath, cough or hemoptysis. She has no fever or chills, no nausea or vomiting. She denied having any significant weight loss or night sweats. She is here today to start cycle #3 of her chemotherapy. She was seen recently by Dr. [name] and repeat MRI of the brain showed improvement in her metastatic brain disease.  MEDICAL HISTORY: Past Medical History  Diagnosis Date  . Cancer     lung, hx cervicle  . Ileus, postoperative 11/18/2013  . Physical deconditioning 11/18/2013  . Severe protein-calorie malnutrition 11/18/2013  . Anemia 11/18/2013  . History of radiation therapy 10-28-13- 12-05-13    lung ca 50 Gy/69f    ALLERGIES:  is allergic to codeine.  MEDICATIONS:  Current  Outpatient Prescriptions  Medication Sig Dispense Refill  . ALPRAZolam (XANAX) 0.5 MG tablet One four times a day as needed for nerves or pain  120 tablet  0  . dexamethasone (DECADRON) 4 MG tablet 4 mg by mouth twice a day the day before, day of and day after the chemotherapy every 3 weeks  40 tablet  1  . emollient (BIAFINE) cream Apply 1 application topically daily. Apply daily after rad tx      . folic acid (FOLVITE) 1 MG tablet Take 1 tablet (1 mg total) by mouth daily.  30 tablet  4  . HYDROcodone-acetaminophen (NORCO) 10-325 MG per tablet Take 1-2 tablets by mouth every 6 (six) hours as needed for moderate pain.  30 tablet  0  . ibuprofen (ADVIL,MOTRIN) 200 MG tablet You can take 2-3 tablets every 6 hours as needed for pain.  30 tablet  0  . mirtazapine (REMERON) 30 MG tablet Take 1 tablet (30 mg total) by mouth at bedtime.  30 tablet  2  . Multiple Vitamin (MULTIVITAMIN WITH MINERALS) TABS tablet Take 1 daily with lunch and a meal.  Make sure it has iron for women.      .Marland Kitchenomeprazole (PRILOSEC) 20 MG capsule Take 20 mg by mouth daily.      . ondansetron (ZOFRAN) 8 MG tablet Take 1 tablet (8 mg total) by mouth every 8 (eight) hours as needed for nausea or vomiting.  30 tablet  0  . potassium chloride SA (K-DUR,KLOR-CON) 20 MEQ tablet Take 1 tablet (20 mEq total) by mouth daily.  7  tablet  0  . prochlorperazine (COMPAZINE) 10 MG tablet Take 1 tablet (10 mg total) by mouth every 6 (six) hours as needed for nausea or vomiting.  60 tablet  0   No current facility-administered medications for this visit.    SURGICAL HISTORY:  Past Surgical History  Procedure Laterality Date  . Video bronchoscopy Bilateral 08/30/2013    Procedure: VIDEO BRONCHOSCOPY WITH FLUORO;  Surgeon: Tanda Rockers, MD;  Location: [facility];  Service: Cardiopulmonary;  Laterality: Bilateral;  . Abdominal hysterectomy    . Laparotomy N/A 11/03/2013    Procedure: EXPLORATORY LAPAROTOMY, DRAINAGE OF INTRA  ABDOMINAL  ABSCESSES, MOBILIZATION OF SPLENIC FLEXURE, SIGMOID COLECTOMY WITH COLOSTOMY;  Surgeon: Odis Hollingshead, MD;  Location: [facility];  Service: General;  Laterality: N/A;    REVIEW OF SYSTEMS:  Constitutional: positive for fatigue Eyes: negative Ears, nose, mouth, throat, and face: negative Respiratory: negative Cardiovascular: negative Gastrointestinal: negative Genitourinary:negative Integument/breast: negative Hematologic/lymphatic: negative Musculoskeletal:negative Neurological: positive for weakness Behavioral/Psych: positive for depression Endocrine: negative Allergic/Immunologic: negative   PHYSICAL EXAMINATION: General appearance: alert, cooperative, fatigued and no distress Head: Normocephalic, without obvious abnormality, atraumatic Neck: no adenopathy, no JVD, supple, symmetrical, trachea midline and thyroid not enlarged, symmetric, no tenderness/mass/nodules Lymph nodes: Cervical, supraclavicular, and axillary nodes normal. Resp: clear to auscultation bilaterally Back: symmetric, no curvature. ROM normal. No CVA tenderness. Cardio: regular rate and rhythm, S1, S2 normal, no murmur, click, rub or gallop GI: soft, non-tender; bowel sounds normal; no masses,  no organomegaly Extremities: extremities normal, atraumatic, no cyanosis or edema Neurologic: Alert and oriented X 3, normal strength and tone. Normal symmetric reflexes. Normal coordination and gait  ECOG PERFORMANCE STATUS: 2 - Symptomatic, <50% confined to bed  Blood pressure 114/94, pulse 85, temperature 98.4 F (36.9 C), temperature source Oral, resp. rate 18, height _0  (1.626 m), weight 115 lb 9.6 oz (52.436 kg), SpO2 100.00%.  LABORATORY DATA: Lab Results  Component Value Date   WBC 6.3 02/17/2014   HGB 11.5* 02/17/2014   HCT 35.7 02/17/2014   MCV 94.2 02/17/2014   PLT 347 02/17/2014      Chemistry      Component Value Date/Time   NA 141 02/17/2014 0927   NA 138 11/16/2013 0530   K 3.5 02/17/2014 0927   K  3.5* 11/16/2013 0530   CL 100 11/16/2013 0530   CO2 25 02/17/2014 0927   CO2 28 11/16/2013 0530   BUN 12.8 02/17/2014 0927   BUN 13 11/16/2013 0530   CREATININE 0.7 02/17/2014 0927   CREATININE 0.48* 11/16/2013 0530      Component Value Date/Time   CALCIUM 9.9 02/17/2014 0927   CALCIUM 8.6 11/16/2013 0530   ALKPHOS 67 02/17/2014 0927   ALKPHOS 145* 11/16/2013 0530   AST 11 02/17/2014 0927   AST 21 11/16/2013 0530   ALT 13 02/17/2014 0927   ALT 36* 11/16/2013 0530   BILITOT 0.27 02/17/2014 0927   BILITOT 0.2* 11/16/2013 0530       RADIOGRAPHIC STUDIES: No results found.  ASSESSMENT AND PLAN: This is a very pleasant 63 years old white female recently diagnosed with a stage IV non-small cell lung cancer, adenocarcinoma with negative EGFR mutation and negative ALK gene translocation. The patient is status post stereotactic radiotherapy to a solitary brain metastases in addition to palliative radiotherapy to the right upper lobe lung mass. The patient is currently undergoing systemic chemotherapy with carboplatin and Alimta status post 2 cycles and tolerating her treatment fairly well. Will proceed  with cycle #3 today as scheduled. The patient would come back for followup visit in 3 weeks for evaluation after repeating CT scan of the chest, abdomen and pelvis for restaging of her disease. For depression, the patient on Remeron 30 mg by mouth each bedtime. The patient was advised to call immediately if she has any concerning symptoms in the interval.  The patient voices understanding of current disease status and treatment options and is in agreement with the current care plan.  All questions were answered. The patient knows to call the clinic with any problems, questions or concerns. We can certainly see the patient much sooner if necessary.  Disclaimer: This note was dictated with voice recognition software. Similar sounding words can inadvertently be transcribed and may not be corrected upon  review.

## 2014-02-17 NOTE — Patient Instructions (Signed)
[facility] Discharge Instructions for Patients Receiving Chemotherapy  Today you received the following chemotherapy agents carboplatin and alimta.  To help prevent nausea and vomiting after your treatment, we encourage you to take your nausea medication zofran every 12 hours or compazine every 6 hours as needed.   If you develop nausea and vomiting that is not controlled by your nausea medication, call the clinic.   BELOW ARE SYMPTOMS THAT SHOULD BE REPORTED IMMEDIATELY:  *FEVER GREATER THAN 100.5 F  *CHILLS WITH OR WITHOUT FEVER  NAUSEA AND VOMITING THAT IS NOT CONTROLLED WITH YOUR NAUSEA MEDICATION  *UNUSUAL SHORTNESS OF BREATH  *UNUSUAL BRUISING OR BLEEDING  TENDERNESS IN MOUTH AND THROAT WITH OR WITHOUT PRESENCE OF ULCERS  *URINARY PROBLEMS  *BOWEL PROBLEMS  UNUSUAL RASH Items with * indicate a potential emergency and should be followed up as soon as possible.  Feel free to call the clinic you have any questions or concerns. The clinic phone number is [phone].

## 2014-02-17 NOTE — Progress Notes (Signed)
Patient reports she is doing much better.  She had one episode of nausea and vomiting on Saturday.  She took nausea medications and nausea and vomiting resolved.  She has no problems with her ostomy and states stools are appropriate.  Weight has increased and was documented as 115.6 pounds on June 22.  Increased from 109.7 pounds June 1.  Patient has no questions or concerns.  She is drinking lots of fluids.  Nutrition diagnosis: Under weight has improved.  Intervention: Patient educated to continue small frequent meals and snacks with protein every time she eats.  Patient to continue oral nutrition supplements once daily.  Teach back method used.  Monitoring, evaluation, goals: Patient will work to increase oral intake to minimize further weight loss and continued healing and repletion.  Next visit: Has not been scheduled.  Patient will contact me with questions or concerns.

## 2014-02-17 NOTE — Telephone Encounter (Signed)
Gave pt appt for lab,md and chemo for June, july and August 2015

## 2014-02-18 ENCOUNTER — Ambulatory Visit: Payer: BC Managed Care – PPO | Admitting: Internal Medicine

## 2014-02-19 ENCOUNTER — Encounter (INDEPENDENT_AMBULATORY_CARE_PROVIDER_SITE_OTHER): Payer: Self-pay | Admitting: General Surgery

## 2014-02-19 ENCOUNTER — Ambulatory Visit (INDEPENDENT_AMBULATORY_CARE_PROVIDER_SITE_OTHER): Payer: BC Managed Care – PPO | Admitting: General Surgery

## 2014-02-19 VITALS — BP 130/74 | HR 94 | Temp 98.5°F | Ht 64.0 in | Wt 120.0 lb

## 2014-02-19 DIAGNOSIS — Z933 Colostomy status: Secondary | ICD-10-CM

## 2014-02-19 NOTE — Progress Notes (Signed)
Procedure:  Emergency exploratory laparotomy with drainage of intra-abdominal abscesses and Hartmann procedure for perforated sigmoid diverticulitis  Date:  11/03/2013  Pathology:  Diverticulitis  History:  She presents for a wound and colostomy check. She continues with treatments for her stage IV lung cancer. Her energy level is good.  They have noticed the wound is healing somewhat slower since she restarted chemotherapy.  They saw Cena Benton and she helped them with colostomy care. Exam: General- Is in NAD. Abdomen-soft, midline wound is almost completely healed, LUQ colostomy is pink and viable.  Assessment:  Wound and colostomy continue to heal well.  Plan:  Continue current wound care.  May drive.  May lift up to 20 pounds.  Return in one month.

## 2014-02-19 NOTE — Patient Instructions (Signed)
Increase protein in diet as we discussed.  Take Vitamin A, C, and zinc as we discussed.

## 2014-02-24 ENCOUNTER — Encounter: Payer: Self-pay | Admitting: Radiation Oncology

## 2014-02-24 ENCOUNTER — Encounter: Payer: Self-pay | Admitting: Internal Medicine

## 2014-02-24 ENCOUNTER — Other Ambulatory Visit (HOSPITAL_BASED_OUTPATIENT_CLINIC_OR_DEPARTMENT_OTHER): Payer: BC Managed Care – PPO

## 2014-02-24 DIAGNOSIS — C3410 Malignant neoplasm of upper lobe, unspecified bronchus or lung: Secondary | ICD-10-CM

## 2014-02-24 LAB — CBC WITH DIFFERENTIAL/PLATELET
BASO%: 0.8 % (ref 0.0–2.0)
Basophils Absolute: 0 10*3/uL (ref 0.0–0.1)
EOS%: 1.5 % (ref 0.0–7.0)
Eosinophils Absolute: 0.1 10*3/uL (ref 0.0–0.5)
HCT: 34 % — ABNORMAL LOW (ref 34.8–46.6)
HGB: 11.2 g/dL — ABNORMAL LOW (ref 11.6–15.9)
LYMPH%: 26.4 % (ref 14.0–49.7)
MCH: 30.8 pg (ref 25.1–34.0)
MCHC: 33 g/dL (ref 31.5–36.0)
MCV: 93.2 fL (ref 79.5–101.0)
MONO#: 0.5 10*3/uL (ref 0.1–0.9)
MONO%: 13.5 % (ref 0.0–14.0)
NEUT#: 2.1 10*3/uL (ref 1.5–6.5)
NEUT%: 57.8 % (ref 38.4–76.8)
Platelets: 340 10*3/uL (ref 145–400)
RBC: 3.64 10*6/uL — ABNORMAL LOW (ref 3.70–5.45)
RDW: 16.3 % — ABNORMAL HIGH (ref 11.2–14.5)
WBC: 3.7 10*3/uL — ABNORMAL LOW (ref 3.9–10.3)
lymph#: 1 10*3/uL (ref 0.9–3.3)

## 2014-02-24 LAB — COMPREHENSIVE METABOLIC PANEL (CC13)
ALT: 12 U/L (ref 0–55)
AST: 10 U/L (ref 5–34)
Albumin: 3.6 g/dL (ref 3.5–5.0)
Alkaline Phosphatase: 69 U/L (ref 40–150)
Anion Gap: 9 mEq/L (ref 3–11)
BUN: 14.9 mg/dL (ref 7.0–26.0)
CO2: 29 mEq/L (ref 22–29)
Calcium: 9.4 mg/dL (ref 8.4–10.4)
Chloride: 102 mEq/L (ref 98–109)
Creatinine: 0.7 mg/dL (ref 0.6–1.1)
Glucose: 104 mg/dl (ref 70–140)
Potassium: 4.1 mEq/L (ref 3.5–5.1)
Sodium: 140 mEq/L (ref 136–145)
Total Bilirubin: 0.31 mg/dL (ref 0.20–1.20)
Total Protein: 6.5 g/dL (ref 6.4–8.3)

## 2014-02-24 NOTE — Progress Notes (Signed)
Put [employer] Form in registration desk.

## 2014-02-24 NOTE — Progress Notes (Signed)
Patient dropped off [employer] paperwork - made copies of Rad Onc bills and forwarded to Med Onc.

## 2014-03-03 ENCOUNTER — Ambulatory Visit (HOSPITAL_COMMUNITY)
Admission: RE | Admit: 2014-03-03 | Discharge: 2014-03-03 | Disposition: A | Discharge status: Home / Self Care | Payer: BC Managed Care – PPO | Source: Ambulatory Visit | Attending: Internal Medicine | Admitting: Internal Medicine

## 2014-03-03 ENCOUNTER — Other Ambulatory Visit (HOSPITAL_BASED_OUTPATIENT_CLINIC_OR_DEPARTMENT_OTHER): Payer: BC Managed Care – PPO

## 2014-03-03 ENCOUNTER — Encounter (HOSPITAL_COMMUNITY): Payer: Self-pay

## 2014-03-03 DIAGNOSIS — C7949 Secondary malignant neoplasm of other parts of nervous system: Secondary | ICD-10-CM

## 2014-03-03 DIAGNOSIS — C3410 Malignant neoplasm of upper lobe, unspecified bronchus or lung: Secondary | ICD-10-CM

## 2014-03-03 DIAGNOSIS — I700 Atherosclerosis of aorta: Secondary | ICD-10-CM | POA: Insufficient documentation

## 2014-03-03 DIAGNOSIS — J438 Other emphysema: Secondary | ICD-10-CM | POA: Insufficient documentation

## 2014-03-03 DIAGNOSIS — C7931 Secondary malignant neoplasm of brain: Secondary | ICD-10-CM

## 2014-03-03 DIAGNOSIS — C3490 Malignant neoplasm of unspecified part of unspecified bronchus or lung: Secondary | ICD-10-CM | POA: Insufficient documentation

## 2014-03-03 DIAGNOSIS — Z933 Colostomy status: Secondary | ICD-10-CM | POA: Insufficient documentation

## 2014-03-03 LAB — CBC WITH DIFFERENTIAL/PLATELET
BASO%: 0.3 % (ref 0.0–2.0)
Basophils Absolute: 0 10*3/uL (ref 0.0–0.1)
EOS%: 0.2 % (ref 0.0–7.0)
Eosinophils Absolute: 0 10*3/uL (ref 0.0–0.5)
HCT: 34.2 % — ABNORMAL LOW (ref 34.8–46.6)
HGB: 11.3 g/dL — ABNORMAL LOW (ref 11.6–15.9)
LYMPH%: 13.3 % — ABNORMAL LOW (ref 14.0–49.7)
MCH: 30.7 pg (ref 25.1–34.0)
MCHC: 33 g/dL (ref 31.5–36.0)
MCV: 93 fL (ref 79.5–101.0)
MONO#: 0.7 10*3/uL (ref 0.1–0.9)
MONO%: 11.2 % (ref 0.0–14.0)
NEUT#: 4.6 10*3/uL (ref 1.5–6.5)
NEUT%: 75 % (ref 38.4–76.8)
Platelets: 132 10*3/uL — ABNORMAL LOW (ref 145–400)
RBC: 3.68 10*6/uL — ABNORMAL LOW (ref 3.70–5.45)
RDW: 16.1 % — ABNORMAL HIGH (ref 11.2–14.5)
WBC: 6.1 10*3/uL (ref 3.9–10.3)
lymph#: 0.8 10*3/uL — ABNORMAL LOW (ref 0.9–3.3)

## 2014-03-03 LAB — COMPREHENSIVE METABOLIC PANEL (CC13)
ALT: 16 U/L (ref 0–55)
AST: 11 U/L (ref 5–34)
Albumin: 3.7 g/dL (ref 3.5–5.0)
Alkaline Phosphatase: 70 U/L (ref 40–150)
Anion Gap: 8 mEq/L (ref 3–11)
BUN: 9.1 mg/dL (ref 7.0–26.0)
CO2: 30 mEq/L — ABNORMAL HIGH (ref 22–29)
Calcium: 9.9 mg/dL (ref 8.4–10.4)
Chloride: 103 mEq/L (ref 98–109)
Creatinine: 0.7 mg/dL (ref 0.6–1.1)
Glucose: 109 mg/dl (ref 70–140)
Potassium: 4.1 mEq/L (ref 3.5–5.1)
Sodium: 141 mEq/L (ref 136–145)
Total Bilirubin: 0.33 mg/dL (ref 0.20–1.20)
Total Protein: 6.9 g/dL (ref 6.4–8.3)

## 2014-03-03 MED ORDER — IOHEXOL 300 MG/ML  SOLN
100.0000 mL | Freq: Once | INTRAMUSCULAR | Status: AC | PRN
  Administered 2014-03-03: 100 mL via INTRAVENOUS

## 2014-03-10 ENCOUNTER — Ambulatory Visit (HOSPITAL_BASED_OUTPATIENT_CLINIC_OR_DEPARTMENT_OTHER): Payer: BC Managed Care – PPO

## 2014-03-10 ENCOUNTER — Encounter: Payer: Self-pay | Admitting: Physician Assistant

## 2014-03-10 ENCOUNTER — Ambulatory Visit (HOSPITAL_BASED_OUTPATIENT_CLINIC_OR_DEPARTMENT_OTHER): Payer: BC Managed Care – PPO | Admitting: Physician Assistant

## 2014-03-10 ENCOUNTER — Other Ambulatory Visit (HOSPITAL_BASED_OUTPATIENT_CLINIC_OR_DEPARTMENT_OTHER): Payer: BC Managed Care – PPO

## 2014-03-10 VITALS — BP 105/60 | HR 64 | Temp 98.4°F | Resp 18 | Ht 64.0 in | Wt 118.4 lb

## 2014-03-10 DIAGNOSIS — C7949 Secondary malignant neoplasm of other parts of nervous system: Secondary | ICD-10-CM

## 2014-03-10 DIAGNOSIS — Z5111 Encounter for antineoplastic chemotherapy: Secondary | ICD-10-CM

## 2014-03-10 DIAGNOSIS — C3410 Malignant neoplasm of upper lobe, unspecified bronchus or lung: Secondary | ICD-10-CM

## 2014-03-10 DIAGNOSIS — F3289 Other specified depressive episodes: Secondary | ICD-10-CM

## 2014-03-10 DIAGNOSIS — C7931 Secondary malignant neoplasm of brain: Secondary | ICD-10-CM

## 2014-03-10 DIAGNOSIS — F329 Major depressive disorder, single episode, unspecified: Secondary | ICD-10-CM

## 2014-03-10 LAB — COMPREHENSIVE METABOLIC PANEL (CC13)
ALT: 10 U/L (ref 0–55)
AST: 9 U/L (ref 5–34)
Albumin: 3.9 g/dL (ref 3.5–5.0)
Alkaline Phosphatase: 69 U/L (ref 40–150)
Anion Gap: 10 mEq/L (ref 3–11)
BUN: 14.5 mg/dL (ref 7.0–26.0)
CO2: 24 mEq/L (ref 22–29)
Calcium: 9.8 mg/dL (ref 8.4–10.4)
Chloride: 105 mEq/L (ref 98–109)
Creatinine: 0.7 mg/dL (ref 0.6–1.1)
Glucose: 125 mg/dl (ref 70–140)
Potassium: 4 mEq/L (ref 3.5–5.1)
Sodium: 139 mEq/L (ref 136–145)
Total Bilirubin: 0.3 mg/dL (ref 0.20–1.20)
Total Protein: 7.1 g/dL (ref 6.4–8.3)

## 2014-03-10 LAB — CBC WITH DIFFERENTIAL/PLATELET
BASO%: 0.2 % (ref 0.0–2.0)
Basophils Absolute: 0 10*3/uL (ref 0.0–0.1)
EOS%: 0 % (ref 0.0–7.0)
Eosinophils Absolute: 0 10*3/uL (ref 0.0–0.5)
HCT: 35.3 % (ref 34.8–46.6)
HGB: 11.7 g/dL (ref 11.6–15.9)
LYMPH%: 6.5 % — ABNORMAL LOW (ref 14.0–49.7)
MCH: 31.2 pg (ref 25.1–34.0)
MCHC: 33 g/dL (ref 31.5–36.0)
MCV: 94.7 fL (ref 79.5–101.0)
MONO#: 0.5 10*3/uL (ref 0.1–0.9)
MONO%: 8.6 % (ref 0.0–14.0)
NEUT#: 4.9 10*3/uL (ref 1.5–6.5)
NEUT%: 84.7 % — ABNORMAL HIGH (ref 38.4–76.8)
Platelets: 347 10*3/uL (ref 145–400)
RBC: 3.73 10*6/uL (ref 3.70–5.45)
RDW: 18.2 % — ABNORMAL HIGH (ref 11.2–14.5)
WBC: 5.8 10*3/uL (ref 3.9–10.3)
lymph#: 0.4 10*3/uL — ABNORMAL LOW (ref 0.9–3.3)

## 2014-03-10 MED ORDER — ONDANSETRON 16 MG/50ML IVPB (CHCC)
16.0000 mg | Freq: Once | INTRAVENOUS | Status: AC
  Administered 2014-03-10: 16 mg via INTRAVENOUS

## 2014-03-10 MED ORDER — SODIUM CHLORIDE 0.9 % IV SOLN
500.0000 mg/m2 | Freq: Once | INTRAVENOUS | Status: AC
  Administered 2014-03-10: 750 mg via INTRAVENOUS
  Filled 2014-03-10: qty 30

## 2014-03-10 MED ORDER — DEXAMETHASONE SODIUM PHOSPHATE 20 MG/5ML IJ SOLN
INTRAMUSCULAR | Status: AC
  Filled 2014-03-10: qty 5

## 2014-03-10 MED ORDER — DEXAMETHASONE SODIUM PHOSPHATE 20 MG/5ML IJ SOLN
20.0000 mg | Freq: Once | INTRAMUSCULAR | Status: AC
  Administered 2014-03-10: 20 mg via INTRAVENOUS

## 2014-03-10 MED ORDER — CARBOPLATIN CHEMO INJECTION 600 MG/60ML
406.0000 mg | Freq: Once | INTRAVENOUS | Status: AC
  Administered 2014-03-10: 410 mg via INTRAVENOUS
  Filled 2014-03-10: qty 41

## 2014-03-10 MED ORDER — ONDANSETRON 16 MG/50ML IVPB (CHCC)
INTRAVENOUS | Status: AC
  Filled 2014-03-10: qty 16

## 2014-03-10 MED ORDER — SODIUM CHLORIDE 0.9 % IV SOLN
Freq: Once | INTRAVENOUS | Status: AC
  Administered 2014-03-10: 11:00:00 via INTRAVENOUS

## 2014-03-10 NOTE — Patient Instructions (Signed)
[facility] Discharge Instructions for Patients Receiving Chemotherapy  Today you received the following chemotherapy agents :  Alimta/Carboplatin To help prevent nausea and vomiting after your treatment, we encourage you to take your nausea medication.   If you develop nausea and vomiting that is not controlled by your nausea medication, call the clinic.   BELOW ARE SYMPTOMS THAT SHOULD BE REPORTED IMMEDIATELY:  *FEVER GREATER THAN 100.5 F  *CHILLS WITH OR WITHOUT FEVER  NAUSEA AND VOMITING THAT IS NOT CONTROLLED WITH YOUR NAUSEA MEDICATION  *UNUSUAL SHORTNESS OF BREATH  *UNUSUAL BRUISING OR BLEEDING  TENDERNESS IN MOUTH AND THROAT WITH OR WITHOUT PRESENCE OF ULCERS  *URINARY PROBLEMS  *BOWEL PROBLEMS  UNUSUAL RASH Items with * indicate a potential emergency and should be followed up as soon as possible.  Feel free to call the clinic you have any questions or concerns. The clinic phone number is [phone].

## 2014-03-10 NOTE — Patient Instructions (Signed)
Your CT scan revealed decrease in your lung mass and lymph nodes Follow up in 3 weeks prior to your next scheduled cycle of chemotherapy

## 2014-03-10 NOTE — Progress Notes (Addendum)
[facility] [phone]   Fax:([phone]) [place] [address] [place] [address]  DIAGNOSIS: Stage IV (T2a, N0, M1b) non-small cell lung cancer consistent with adenocarcinoma with negative EGFR mutation and negative ALK gene translocation diagnosed in January of 2015 presented with right upper lobe lung mass in addition to a solitary brain metastasis status post stereotactic to a solitary brain metastasis as well as radiotherapy to the right upper lobe lung mass.  PRIOR THERAPY:  1) Status post stereotactic radiotherapy to a solitary right parietal brain lesion under the care of Dr. [name] on 10/16/2013. 2) Status post palliative radiotherapy to the right lung tumor under the care of Dr. [name] completed on 12/05/2013.  CURRENT THERAPY: Systemic chemotherapy with carboplatin for AUC of 5 and Alimta 500 mg/M2 every 3 weeks. First dose Jan 06 2014. Status post 3 cycles.  INTERVAL HISTORY: Alexis [name] 63 y.o. female returns to the clinic today for followup visit accompanied by her husband as well as other family members. The patient tolerated the last cycle of her systemic chemotherapy with carboplatin and Alimta fairly well. She denied having any significant chest pain, shortness breath, cough or hemoptysis. She has no fever or chills, no nausea or vomiting. She denied having any significant weight loss or night sweats. She is here today to start cycle #4 of her chemotherapy. She recently has a restaging scan of her chest, abdomen and pelvis to re-evaluate her disease and also presents to discuss the results.   MEDICAL HISTORY: Past Medical History  Diagnosis Date  . Cancer     lung, hx cervicle  . Ileus, postoperative 11/18/2013  . Physical deconditioning 11/18/2013  . Severe protein-calorie malnutrition 11/18/2013  . Anemia 11/18/2013  . History of radiation therapy 10-28-13- 12-05-13    lung ca 50 Gy/80f     ALLERGIES:  is allergic to codeine.  MEDICATIONS:  Current Outpatient Prescriptions  Medication Sig Dispense Refill  . ALPRAZolam (XANAX) 0.5 MG tablet One four times a day as needed for nerves or pain  120 tablet  0  . dexamethasone (DECADRON) 4 MG tablet 4 mg by mouth twice a day the day before, day of and day after the chemotherapy every 3 weeks  40 tablet  1  . emollient (BIAFINE) cream Apply 1 application topically daily. Apply daily after rad tx      . mirtazapine (REMERON) 30 MG tablet Take 1 tablet (30 mg total) by mouth at bedtime.  30 tablet  2  . Multiple Vitamin (MULTIVITAMIN WITH MINERALS) TABS tablet Take 1 daily with lunch and a meal.  Make sure it has iron for women.      . ondansetron (ZOFRAN) 8 MG tablet Take 1 tablet (8 mg total) by mouth every 8 (eight) hours as needed for nausea or vomiting.  30 tablet  0  . folic acid (FOLVITE) 1 MG tablet Take 1 tablet (1 mg total) by mouth daily.  30 tablet  4  . HYDROcodone-acetaminophen (NORCO) 10-325 MG per tablet Take 1-2 tablets by mouth every 6 (six) hours as needed for moderate pain.  30 tablet  0  . ibuprofen (ADVIL,MOTRIN) 200 MG tablet You can take 2-3 tablets every 6 hours as needed for pain.  30 tablet  0  . omeprazole (PRILOSEC) 20 MG capsule Take 20 mg by mouth daily.      . potassium chloride SA (K-DUR,KLOR-CON) 20 MEQ tablet  Take 1 tablet (20 mEq total) by mouth daily.  7 tablet  0  . prochlorperazine (COMPAZINE) 10 MG tablet Take 1 tablet (10 mg total) by mouth every 6 (six) hours as needed for nausea or vomiting.  60 tablet  0   No current facility-administered medications for this visit.    SURGICAL HISTORY:  Past Surgical History  Procedure Laterality Date  . Video bronchoscopy Bilateral 08/30/2013    Procedure: VIDEO BRONCHOSCOPY WITH FLUORO;  Surgeon: Tanda Rockers, MD;  Location: [facility];  Service: Cardiopulmonary;  Laterality: Bilateral;  . Abdominal hysterectomy    . Laparotomy N/A 11/03/2013     Procedure: EXPLORATORY LAPAROTOMY, DRAINAGE OF INTRA  ABDOMINAL ABSCESSES, MOBILIZATION OF SPLENIC FLEXURE, SIGMOID COLECTOMY WITH COLOSTOMY;  Surgeon: Odis Hollingshead, MD;  Location: [facility];  Service: General;  Laterality: N/A;    REVIEW OF SYSTEMS:  Constitutional: positive for fatigue Eyes: negative Ears, nose, mouth, throat, and face: negative Respiratory: negative Cardiovascular: negative Gastrointestinal: negative Genitourinary:negative Integument/breast: negative Hematologic/lymphatic: negative Musculoskeletal:negative Neurological: positive for weakness Behavioral/Psych: positive for depression Endocrine: negative Allergic/Immunologic: negative   PHYSICAL EXAMINATION: General appearance: alert, cooperative, fatigued and no distress Head: Normocephalic, without obvious abnormality, atraumatic Neck: no adenopathy, no JVD, supple, symmetrical, trachea midline and thyroid not enlarged, symmetric, no tenderness/mass/nodules Lymph nodes: Cervical, supraclavicular, and axillary nodes normal. Resp: clear to auscultation bilaterally Back: symmetric, no curvature. ROM normal. No CVA tenderness. Cardio: regular rate and rhythm, S1, S2 normal, no murmur, click, rub or gallop GI: soft, non-tender; bowel sounds normal; no masses,  no organomegaly Extremities: extremities normal, atraumatic, no cyanosis or edema Neurologic: Alert and oriented X 3, normal strength and tone. Normal symmetric reflexes. Normal coordination and gait  ECOG PERFORMANCE STATUS: 2 - Symptomatic, <50% confined to bed  Blood pressure 105/60, pulse 64, temperature 98.4 F (36.9 C), temperature source Oral, resp. rate 18, height $RemoveBe'5\' 4"'UlBtavSQZ$  (1.626 m), weight 118 lb 6.4 oz (53.706 kg), SpO2 100.00%.  LABORATORY DATA: Lab Results  Component Value Date   WBC 5.8 03/10/2014   HGB 11.7 03/10/2014   HCT 35.3 03/10/2014   MCV 94.7 03/10/2014   PLT 347 03/10/2014      Chemistry      Component Value Date/Time   NA 139  03/10/2014 0919   NA 138 11/16/2013 0530   K 4.0 03/10/2014 0919   K 3.5* 11/16/2013 0530   CL 100 11/16/2013 0530   CO2 24 03/10/2014 0919   CO2 28 11/16/2013 0530   BUN 14.5 03/10/2014 0919   BUN 13 11/16/2013 0530   CREATININE 0.7 03/10/2014 0919   CREATININE 0.48* 11/16/2013 0530      Component Value Date/Time   CALCIUM 9.8 03/10/2014 0919   CALCIUM 8.6 11/16/2013 0530   ALKPHOS 69 03/10/2014 0919   ALKPHOS 145* 11/16/2013 0530   AST 9 03/10/2014 0919   AST 21 11/16/2013 0530   ALT 10 03/10/2014 0919   ALT 36* 11/16/2013 0530   BILITOT 0.30 03/10/2014 0919   BILITOT 0.2* 11/16/2013 0530       RADIOGRAPHIC STUDIES: Ct Chest W Contrast  03/03/2014   CLINICAL DATA:  Lung cancer.  EXAM: CT CHEST, ABDOMEN, AND PELVIS WITH CONTRAST  TECHNIQUE: Multidetector CT imaging of the chest, abdomen and pelvis was performed following the standard protocol during bolus administration of intravenous contrast.  CONTRAST:  112mL OMNIPAQUE IOHEXOL 300 MG/ML  SOLN  COMPARISON:  11/08/2013.  FINDINGS: CT CHEST FINDINGS  Soft tissue / Mediastinum: There is no axillary  lymphadenopathy. No mediastinal Mid were left hilar lymphadenopathy. 13 mm right hilar lymph node median measured previously is now 10 mm in the same dimension. Heart size is normal. No pericardial effusion. Thoracic esophagus is normal in appearance. Small lymph nodes adjacent to the distal esophagus are unchanged.  Lungs / Pleura: Marked upper lobe emphysema is noted bilaterally. 3.6 x 3.6 cm right upper lobe lung mass seen on the previous study has decreased in size to 2.2 x 2.3 cm on today's exam. With no new nodule or mass in either lung. There is no pulmonary edema or focal airspace consolidation. No pleural effusion.  Bones: Bone windows reveal no worrisome lytic or sclerotic osseous lesions.  CT ABDOMEN AND PELVIS FINDINGS  Liver:  Normal.  Spleen: Normal.  Stomach: Normal.  Pancreas: Normal without mass lesion or main duct dilatation.  Gallbladder/Biliary:  No gallstones. No intra or extrahepatic biliary duct dilatation.  Kidneys/Adrenals: No adrenal nodule or mass. The kidneys are normal bilaterally.  Bowel Loops: E duodenum is normal. Small bowel loops have normal imaging features. The terminal ileum is normal. The appendix is normal. Right colon is normal. A sigmoid end colostomy is evident. Hartmann's pouch has normal CT imaging features.  Nodes: No abdominal lymphadenopathy. There is no pelvic sidewall lymphadenopathy.  Vasculature: Atherosclerotic calcification is noted in the wall of the abdominal aorta without aneurysm.  Pelvic Genitourinary: Bladder is normal in appearance. Uterus is surgically absent. There is no adnexal mass.  Bones/Musculoskeletal: Bone windows reveal no worrisome lytic or sclerotic osseous lesions.  Body Wall: No evidence for abdominal wall hernia.  Other: No intraperitoneal free fluid.  IMPRESSION: Interval decrease in size of the right upper lobe lung mass with decreasing right hilar lymphadenopathy.  Otherwise stable CT exam of the chest, abdomen, and pelvis.   Electronically Signed   By: Misty Stanley M.D.   On: 03/03/2014 12:35   Ct Abdomen Pelvis W Contrast  03/03/2014   CLINICAL DATA:  Lung cancer.  EXAM: CT CHEST, ABDOMEN, AND PELVIS WITH CONTRAST  TECHNIQUE: Multidetector CT imaging of the chest, abdomen and pelvis was performed following the standard protocol during bolus administration of intravenous contrast.  CONTRAST:  114m OMNIPAQUE IOHEXOL 300 MG/ML  SOLN  COMPARISON:  11/08/2013.  FINDINGS: CT CHEST FINDINGS  Soft tissue / Mediastinum: There is no axillary lymphadenopathy. No mediastinal Mid were left hilar lymphadenopathy. 13 mm right hilar lymph node median measured previously is now 10 mm in the same dimension. Heart size is normal. No pericardial effusion. Thoracic esophagus is normal in appearance. Small lymph nodes adjacent to the distal esophagus are unchanged.  Lungs / Pleura: Marked upper lobe emphysema is  noted bilaterally. 3.6 x 3.6 cm right upper lobe lung mass seen on the previous study has decreased in size to 2.2 x 2.3 cm on today's exam. With no new nodule or mass in either lung. There is no pulmonary edema or focal airspace consolidation. No pleural effusion.  Bones: Bone windows reveal no worrisome lytic or sclerotic osseous lesions.  CT ABDOMEN AND PELVIS FINDINGS  Liver:  Normal.  Spleen: Normal.  Stomach: Normal.  Pancreas: Normal without mass lesion or main duct dilatation.  Gallbladder/Biliary: No gallstones. No intra or extrahepatic biliary duct dilatation.  Kidneys/Adrenals: No adrenal nodule or mass. The kidneys are normal bilaterally.  Bowel Loops: E duodenum is normal. Small bowel loops have normal imaging features. The terminal ileum is normal. The appendix is normal. Right colon is normal. A sigmoid end colostomy is evident.  Hartmann's pouch has normal CT imaging features.  Nodes: No abdominal lymphadenopathy. There is no pelvic sidewall lymphadenopathy.  Vasculature: Atherosclerotic calcification is noted in the wall of the abdominal aorta without aneurysm.  Pelvic Genitourinary: Bladder is normal in appearance. Uterus is surgically absent. There is no adnexal mass.  Bones/Musculoskeletal: Bone windows reveal no worrisome lytic or sclerotic osseous lesions.  Body Wall: No evidence for abdominal wall hernia.  Other: No intraperitoneal free fluid.  IMPRESSION: Interval decrease in size of the right upper lobe lung mass with decreasing right hilar lymphadenopathy.  Otherwise stable CT exam of the chest, abdomen, and pelvis.   Electronically Signed   By: Misty Stanley M.D.   On: 03/03/2014 12:35   ASSESSMENT AND PLAN: This is a very pleasant 63 years old white female recently diagnosed with a stage IV non-small cell lung cancer, adenocarcinoma with negative EGFR mutation and negative ALK gene translocation. The patient is status post stereotactic radiotherapy to a solitary brain metastases in  addition to palliative radiotherapy to the right upper lobe lung mass. The patient is currently undergoing systemic chemotherapy with carboplatin and Alimta status post 3 cycles and tolerating her treatment fairly well. The patient was discussed with and also seen by Dr. Julien Nordmann. Her restaging CT scan revealed further decrease in the right upper lobe lung mass with decreasing hilar lymphadenopathy. Will proceed with cycle #4 today as scheduled.  She will return in 3 weeks prior to cycle #5.  For depression, the patient will continue on Remeron 30 mg by mouth each bedtime. The patient was advised to call immediately if she has any concerning symptoms in the interval.  The patient voices understanding of current disease status and treatment options and is in agreement with the current care plan.  All questions were answered. The patient knows to call the clinic with any problems, questions or concerns. We can certainly see the patient much sooner if necessary.  Carlton Adam PA-C  ADDENDUM: Hematology/Oncology Attending: I had a face to face encounter with the patient. I recommended her care plan. This is a very pleasant 63 years old white female with stage IV non-small cell lung cancer, adenocarcinoma. She is currently undergoing systemic chemotherapy with carboplatin and Alimta status post 3 cycles. She is tolerating her treatment fairly well with no significant adverse effects. The patient was accompanied by several family members today.  Her recent CT scan of the chest showed improvement in her disease. I discussed the scan results with the patient and her family.  I recommended for her to continue the current systemic chemotherapy with carboplatin and Alimta as scheduled. She will receive cycle #4 today. She would come back for followup visit in 3 weeks with the start of cycle #5.  She was advised to call immediately if she has any concerning symptoms in the interval.   Disclaimer: This  note was dictated with voice recognition software. Similar sounding words can inadvertently be transcribed and may not be corrected upon review. Eilleen Kempf., MD 03/15/2014

## 2014-03-17 ENCOUNTER — Other Ambulatory Visit (HOSPITAL_BASED_OUTPATIENT_CLINIC_OR_DEPARTMENT_OTHER): Payer: BC Managed Care – PPO

## 2014-03-17 DIAGNOSIS — C3410 Malignant neoplasm of upper lobe, unspecified bronchus or lung: Secondary | ICD-10-CM

## 2014-03-17 LAB — CBC WITH DIFFERENTIAL/PLATELET
BASO%: 0.3 % (ref 0.0–2.0)
Basophils Absolute: 0 10*3/uL (ref 0.0–0.1)
EOS%: 2.1 % (ref 0.0–7.0)
Eosinophils Absolute: 0.1 10*3/uL (ref 0.0–0.5)
HCT: 32.5 % — ABNORMAL LOW (ref 34.8–46.6)
HGB: 10.7 g/dL — ABNORMAL LOW (ref 11.6–15.9)
LYMPH%: 29.4 % (ref 14.0–49.7)
MCH: 30.6 pg (ref 25.1–34.0)
MCHC: 32.9 g/dL (ref 31.5–36.0)
MCV: 92.9 fL (ref 79.5–101.0)
MONO#: 0.5 10*3/uL (ref 0.1–0.9)
MONO%: 16.4 % — ABNORMAL HIGH (ref 0.0–14.0)
NEUT#: 1.5 10*3/uL (ref 1.5–6.5)
NEUT%: 51.8 % (ref 38.4–76.8)
Platelets: 270 10*3/uL (ref 145–400)
RBC: 3.5 10*6/uL — ABNORMAL LOW (ref 3.70–5.45)
RDW: 16.9 % — ABNORMAL HIGH (ref 11.2–14.5)
WBC: 2.9 10*3/uL — ABNORMAL LOW (ref 3.9–10.3)
lymph#: 0.8 10*3/uL — ABNORMAL LOW (ref 0.9–3.3)

## 2014-03-17 LAB — COMPREHENSIVE METABOLIC PANEL (CC13)
ALT: 14 U/L (ref 0–55)
AST: 13 U/L (ref 5–34)
Albumin: 3.4 g/dL — ABNORMAL LOW (ref 3.5–5.0)
Alkaline Phosphatase: 61 U/L (ref 40–150)
Anion Gap: 8 mEq/L (ref 3–11)
BUN: 17.6 mg/dL (ref 7.0–26.0)
CO2: 29 mEq/L (ref 22–29)
Calcium: 9.4 mg/dL (ref 8.4–10.4)
Chloride: 101 mEq/L (ref 98–109)
Creatinine: 0.6 mg/dL (ref 0.6–1.1)
Glucose: 98 mg/dl (ref 70–140)
Potassium: 4 mEq/L (ref 3.5–5.1)
Sodium: 138 mEq/L (ref 136–145)
Total Bilirubin: 0.45 mg/dL (ref 0.20–1.20)
Total Protein: 6.5 g/dL (ref 6.4–8.3)

## 2014-03-19 ENCOUNTER — Ambulatory Visit (INDEPENDENT_AMBULATORY_CARE_PROVIDER_SITE_OTHER): Payer: BC Managed Care – PPO | Admitting: General Surgery

## 2014-03-19 VITALS — BP 122/80 | HR 51 | Temp 97.8°F | Ht 64.0 in | Wt 119.0 lb

## 2014-03-19 DIAGNOSIS — Z933 Colostomy status: Secondary | ICD-10-CM

## 2014-03-19 NOTE — Patient Instructions (Signed)
At the time that Dr. Julien Nordmann feels it is safe for you to undergo major abdominal surgery from your lung cancer standpoint, he can refer you back to me.

## 2014-03-19 NOTE — Progress Notes (Signed)
Procedure:  Emergency exploratory laparotomy with drainage of intra-abdominal abscesses and Hartmann procedure for perforated sigmoid diverticulitis  Date:  11/03/2013  Pathology:  Diverticulitis  History:  She presents for a wound and colostomy check. She continues with treatments for her stage IV lung cancer. Her energy level is good.  Restaging CT scans demonstrated that her lung cancer is responding to the chemotherapy.   Exam: General- Is in NAD. Abdomen-soft, midline wound is completely healed, LUQ colostomy is pink and viable.  Assessment:  Status post Hartmann procedure for perforated sigmoid diverticulitis-midline wound has completely healed. Colostomy looks good. She is interested and eventually having colostomy reversal.  Plan:  I explained to her that treatment of her lung cancer was her first priority and she understands this. If she ever got to a point where Dr. Julien Nordmann felt she could undergo major abdominal surgery with respect to her lung cancer, I would be glad to see her again and speak with her about reversal of the colostomy.

## 2014-03-24 ENCOUNTER — Other Ambulatory Visit (HOSPITAL_BASED_OUTPATIENT_CLINIC_OR_DEPARTMENT_OTHER): Payer: BC Managed Care – PPO

## 2014-03-24 DIAGNOSIS — C3410 Malignant neoplasm of upper lobe, unspecified bronchus or lung: Secondary | ICD-10-CM

## 2014-03-24 DIAGNOSIS — C7949 Secondary malignant neoplasm of other parts of nervous system: Secondary | ICD-10-CM

## 2014-03-24 DIAGNOSIS — C7931 Secondary malignant neoplasm of brain: Secondary | ICD-10-CM

## 2014-03-24 LAB — CBC WITH DIFFERENTIAL/PLATELET
BASO%: 0.1 % (ref 0.0–2.0)
Basophils Absolute: 0 10*3/uL (ref 0.0–0.1)
EOS%: 0.3 % (ref 0.0–7.0)
Eosinophils Absolute: 0 10*3/uL (ref 0.0–0.5)
HCT: 30.4 % — ABNORMAL LOW (ref 34.8–46.6)
HGB: 10.2 g/dL — ABNORMAL LOW (ref 11.6–15.9)
LYMPH%: 12.2 % — ABNORMAL LOW (ref 14.0–49.7)
MCH: 31.5 pg (ref 25.1–34.0)
MCHC: 33.4 g/dL (ref 31.5–36.0)
MCV: 94.1 fL (ref 79.5–101.0)
MONO#: 0.8 10*3/uL (ref 0.1–0.9)
MONO%: 14.5 % — ABNORMAL HIGH (ref 0.0–14.0)
NEUT#: 3.9 10*3/uL (ref 1.5–6.5)
NEUT%: 72.9 % (ref 38.4–76.8)
Platelets: 124 10*3/uL — ABNORMAL LOW (ref 145–400)
RBC: 3.23 10*6/uL — ABNORMAL LOW (ref 3.70–5.45)
RDW: 18 % — ABNORMAL HIGH (ref 11.2–14.5)
WBC: 5.3 10*3/uL (ref 3.9–10.3)
lymph#: 0.7 10*3/uL — ABNORMAL LOW (ref 0.9–3.3)

## 2014-03-24 LAB — COMPREHENSIVE METABOLIC PANEL (CC13)
ALT: 14 U/L (ref 0–55)
AST: 12 U/L (ref 5–34)
Albumin: 3.6 g/dL (ref 3.5–5.0)
Alkaline Phosphatase: 67 U/L (ref 40–150)
Anion Gap: 8 mEq/L (ref 3–11)
BUN: 12.1 mg/dL (ref 7.0–26.0)
CO2: 30 mEq/L — ABNORMAL HIGH (ref 22–29)
Calcium: 9.3 mg/dL (ref 8.4–10.4)
Chloride: 103 mEq/L (ref 98–109)
Creatinine: 0.7 mg/dL (ref 0.6–1.1)
Glucose: 107 mg/dl (ref 70–140)
Potassium: 3.8 mEq/L (ref 3.5–5.1)
Sodium: 141 mEq/L (ref 136–145)
Total Bilirubin: 0.44 mg/dL (ref 0.20–1.20)
Total Protein: 6.6 g/dL (ref 6.4–8.3)

## 2014-03-31 ENCOUNTER — Ambulatory Visit (HOSPITAL_BASED_OUTPATIENT_CLINIC_OR_DEPARTMENT_OTHER): Payer: BC Managed Care – PPO | Admitting: Oncology

## 2014-03-31 ENCOUNTER — Other Ambulatory Visit (HOSPITAL_BASED_OUTPATIENT_CLINIC_OR_DEPARTMENT_OTHER): Payer: BC Managed Care – PPO

## 2014-03-31 ENCOUNTER — Encounter: Payer: Self-pay | Admitting: Oncology

## 2014-03-31 ENCOUNTER — Ambulatory Visit (HOSPITAL_BASED_OUTPATIENT_CLINIC_OR_DEPARTMENT_OTHER): Payer: BC Managed Care – PPO

## 2014-03-31 ENCOUNTER — Ambulatory Visit: Payer: BC Managed Care – PPO | Admitting: Nutrition

## 2014-03-31 ENCOUNTER — Other Ambulatory Visit: Payer: Self-pay | Admitting: Internal Medicine

## 2014-03-31 ENCOUNTER — Telehealth: Payer: Self-pay | Admitting: Internal Medicine

## 2014-03-31 VITALS — BP 109/40 | HR 70 | Temp 98.3°F | Resp 18

## 2014-03-31 DIAGNOSIS — R5383 Other fatigue: Secondary | ICD-10-CM

## 2014-03-31 DIAGNOSIS — C7949 Secondary malignant neoplasm of other parts of nervous system: Secondary | ICD-10-CM

## 2014-03-31 DIAGNOSIS — C3410 Malignant neoplasm of upper lobe, unspecified bronchus or lung: Secondary | ICD-10-CM

## 2014-03-31 DIAGNOSIS — C7931 Secondary malignant neoplasm of brain: Secondary | ICD-10-CM

## 2014-03-31 DIAGNOSIS — Z5111 Encounter for antineoplastic chemotherapy: Secondary | ICD-10-CM

## 2014-03-31 DIAGNOSIS — R5381 Other malaise: Secondary | ICD-10-CM

## 2014-03-31 DIAGNOSIS — F329 Major depressive disorder, single episode, unspecified: Secondary | ICD-10-CM

## 2014-03-31 DIAGNOSIS — F3289 Other specified depressive episodes: Secondary | ICD-10-CM

## 2014-03-31 LAB — CBC WITH DIFFERENTIAL/PLATELET
BASO%: 0.1 % (ref 0.0–2.0)
Basophils Absolute: 0 10*3/uL (ref 0.0–0.1)
EOS%: 0 % (ref 0.0–7.0)
Eosinophils Absolute: 0 10*3/uL (ref 0.0–0.5)
HCT: 32.9 % — ABNORMAL LOW (ref 34.8–46.6)
HGB: 11 g/dL — ABNORMAL LOW (ref 11.6–15.9)
LYMPH%: 4.3 % — ABNORMAL LOW (ref 14.0–49.7)
MCH: 31.6 pg (ref 25.1–34.0)
MCHC: 33.3 g/dL (ref 31.5–36.0)
MCV: 94.9 fL (ref 79.5–101.0)
MONO#: 0.3 10*3/uL (ref 0.1–0.9)
MONO%: 5 % (ref 0.0–14.0)
NEUT#: 5.7 10*3/uL (ref 1.5–6.5)
NEUT%: 90.6 % — ABNORMAL HIGH (ref 38.4–76.8)
Platelets: 343 10*3/uL (ref 145–400)
RBC: 3.47 10*6/uL — ABNORMAL LOW (ref 3.70–5.45)
RDW: 19.7 % — ABNORMAL HIGH (ref 11.2–14.5)
WBC: 6.3 10*3/uL (ref 3.9–10.3)
lymph#: 0.3 10*3/uL — ABNORMAL LOW (ref 0.9–3.3)

## 2014-03-31 LAB — COMPREHENSIVE METABOLIC PANEL (CC13)
ALT: 8 U/L (ref 0–55)
AST: 11 U/L (ref 5–34)
Albumin: 3.7 g/dL (ref 3.5–5.0)
Alkaline Phosphatase: 75 U/L (ref 40–150)
Anion Gap: 9 mEq/L (ref 3–11)
BUN: 11.6 mg/dL (ref 7.0–26.0)
CO2: 27 mEq/L (ref 22–29)
Calcium: 9.7 mg/dL (ref 8.4–10.4)
Chloride: 105 mEq/L (ref 98–109)
Creatinine: 0.7 mg/dL (ref 0.6–1.1)
Glucose: 134 mg/dl (ref 70–140)
Potassium: 3.8 mEq/L (ref 3.5–5.1)
Sodium: 141 mEq/L (ref 136–145)
Total Bilirubin: 0.36 mg/dL (ref 0.20–1.20)
Total Protein: 7 g/dL (ref 6.4–8.3)

## 2014-03-31 MED ORDER — DEXAMETHASONE SODIUM PHOSPHATE 20 MG/5ML IJ SOLN
20.0000 mg | Freq: Once | INTRAMUSCULAR | Status: AC
  Administered 2014-03-31: 20 mg via INTRAVENOUS

## 2014-03-31 MED ORDER — SODIUM CHLORIDE 0.9 % IV SOLN
Freq: Once | INTRAVENOUS | Status: AC
  Administered 2014-03-31: 12:00:00 via INTRAVENOUS

## 2014-03-31 MED ORDER — DEXAMETHASONE SODIUM PHOSPHATE 20 MG/5ML IJ SOLN
INTRAMUSCULAR | Status: AC
  Filled 2014-03-31: qty 5

## 2014-03-31 MED ORDER — ONDANSETRON 16 MG/50ML IVPB (CHCC)
16.0000 mg | Freq: Once | INTRAVENOUS | Status: AC
  Administered 2014-03-31: 16 mg via INTRAVENOUS

## 2014-03-31 MED ORDER — ONDANSETRON 16 MG/50ML IVPB (CHCC)
INTRAVENOUS | Status: AC
  Filled 2014-03-31: qty 16

## 2014-03-31 MED ORDER — PEMETREXED DISODIUM CHEMO INJECTION 500 MG
500.0000 mg/m2 | Freq: Once | INTRAVENOUS | Status: AC
  Administered 2014-03-31: 750 mg via INTRAVENOUS
  Filled 2014-03-31: qty 30

## 2014-03-31 MED ORDER — CARBOPLATIN CHEMO INJECTION 600 MG/60ML
406.0000 mg | Freq: Once | INTRAVENOUS | Status: AC
  Administered 2014-03-31: 410 mg via INTRAVENOUS
  Filled 2014-03-31: qty 41

## 2014-03-31 NOTE — Telephone Encounter (Signed)
gv and printed appt shed and avs for pt for Aug...sed added tx.

## 2014-03-31 NOTE — Progress Notes (Signed)
Patient reports she feels well.  Weight has continued to increase and was documented as 119 pounds July 22, up from 115.6 pounds June 22.  Patient denies problems with ostomy.  Nausea and vomiting resolved.  Nutrition diagnosis: Under weight has resolved.  Provided support and encouragement for patient to continue adequate calories and protein.  Patient has my contact information for questions or concerns.  **Disclaimer: This note was dictated with voice recognition software. Similar sounding words can inadvertently be transcribed and this note may contain transcription errors which may not have been corrected upon publication of note.**

## 2014-03-31 NOTE — Progress Notes (Signed)
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  PRIOR THERAPY:  1) Status post stereotactic radiotherapy to a solitary right parietal brain lesion under the care of Dr. [name] on 10/16/2013. 2) Status post palliative radiotherapy to the right lung tumor under the care of Dr. [name] completed on 12/05/2013.  CURRENT THERAPY: Systemic chemotherapy with carboplatin for AUC of 5 and Alimta 500 mg/M2 every 3 weeks. First dose Jan 06 2014. Status post 4 cycles.  INTERVAL HISTORY: Alexis [name] 63 y.o. female returns to the clinic today for followup visit accompanied by her husband as well. The patient tolerated the last cycle of her systemic chemotherapy with carboplatin and Alimta fairly well. She denied having any significant chest pain, shortness breath, cough or hemoptysis. She has no fever or chills, no nausea or vomiting. She denied having any significant weight loss or night sweats. She is here today to start cycle #5 of her chemotherapy.   MEDICAL HISTORY: Past Medical History  Diagnosis Date  . Cancer     lung, hx cervicle  . Ileus, postoperative 11/18/2013  . Physical deconditioning 11/18/2013  . Severe protein-calorie malnutrition 11/18/2013  . Anemia 11/18/2013  . History of radiation therapy 10-28-13- 12-05-13    lung ca 50 Gy/20f    ALLERGIES:  is allergic to codeine.  MEDICATIONS:  Current Outpatient Prescriptions  Medication Sig Dispense Refill  . ALPRAZolam (XANAX) 0.5 MG tablet One four  times a day as needed for nerves or pain  120 tablet  0  . dexamethasone (DECADRON) 4 MG tablet 4 mg by mouth twice a day the day before, day of and day after the chemotherapy every 3 weeks  40 tablet  1  . emollient (BIAFINE) cream Apply 1 application topically daily. Apply daily after rad tx      . folic acid (FOLVITE) 1 MG tablet Take 1 tablet (1 mg total) by mouth daily.  30 tablet  4  . HYDROcodone-acetaminophen (NORCO) 10-325 MG per tablet Take 1-2 tablets by mouth every 6 (six) hours as needed for moderate pain.  30 tablet  0  . ibuprofen (ADVIL,MOTRIN) 200 MG tablet You can take 2-3 tablets every 6 hours as needed for pain.  30 tablet  0  . mirtazapine (REMERON) 30 MG tablet Take 1 tablet (30 mg total) by mouth at bedtime.  30 tablet  2  . Multiple Vitamin (MULTIVITAMIN WITH MINERALS) TABS tablet Take 1 daily with lunch and a meal.  Make sure it has iron for women.      .Marland Kitchenomeprazole (PRILOSEC) 20 MG capsule Take 20 mg by mouth daily.      . ondansetron (ZOFRAN) 8 MG tablet Take 1 tablet (8 mg total) by mouth every 8 (eight) hours as needed for nausea or vomiting.  30 tablet  0  . potassium chloride SA (K-DUR,KLOR-CON) 20 MEQ tablet Take 1 tablet (20 mEq total) by mouth daily.  7 tablet  0  . prochlorperazine (COMPAZINE) 10 MG tablet Take 1 tablet (10 mg total)  by mouth every 6 (six) hours as needed for nausea or vomiting.  60 tablet  0   No current facility-administered medications for this visit.   Facility-Administered Medications Ordered in Other Visits  Medication Dose Route Frequency Provider Last Rate Last Dose  . CARBOplatin (PARAPLATIN) 410 mg in sodium chloride 0.9 % 250 mL chemo infusion  410 mg Intravenous Once Curt Bears, MD      . PEMEtrexed (ALIMTA) 750 mg in sodium chloride 0.9 % 100 mL chemo infusion  500 mg/m2 (Treatment Plan Actual) Intravenous Once Curt Bears, MD        SURGICAL HISTORY:  Past Surgical History  Procedure Laterality Date  . Video  bronchoscopy Bilateral 08/30/2013    Procedure: VIDEO BRONCHOSCOPY WITH FLUORO;  Surgeon: Tanda Rockers, MD;  Location: [facility];  Service: Cardiopulmonary;  Laterality: Bilateral;  . Abdominal hysterectomy    . Laparotomy N/A 11/03/2013    Procedure: EXPLORATORY LAPAROTOMY, DRAINAGE OF INTRA  ABDOMINAL ABSCESSES, MOBILIZATION OF SPLENIC FLEXURE, SIGMOID COLECTOMY WITH COLOSTOMY;  Surgeon: Odis Hollingshead, MD;  Location: [facility];  Service: General;  Laterality: N/A;    REVIEW OF SYSTEMS:  Constitutional: positive for fatigue Eyes: negative Ears, nose, mouth, throat, and face: negative Respiratory: negative Cardiovascular: negative Gastrointestinal: negative Genitourinary:negative Integument/breast: negative Hematologic/lymphatic: negative Musculoskeletal:negative Neurological: positive for weakness Behavioral/Psych: positive for depression Endocrine: negative Allergic/Immunologic: negative   PHYSICAL EXAMINATION: General appearance: alert, cooperative, fatigued and no distress Head: Normocephalic, without obvious abnormality, atraumatic Neck: no adenopathy, no JVD, supple, symmetrical, trachea midline and thyroid not enlarged, symmetric, no tenderness/mass/nodules Lymph nodes: Cervical, supraclavicular, and axillary nodes normal. Resp: clear to auscultation bilaterally Back: symmetric, no curvature. ROM normal. No CVA tenderness. Cardio: regular rate and rhythm, S1, S2 normal, no murmur, click, rub or gallop GI: soft, non-tender; bowel sounds normal; no masses,  no organomegaly Extremities: extremities normal, atraumatic, no cyanosis or edema Neurologic: Alert and oriented X 3, normal strength and tone. Normal symmetric reflexes. Normal coordination and gait  ECOG PERFORMANCE STATUS: 1 - Symptomatic but completely ambulatory  Blood pressure 109/40, pulse 70, temperature 98.3 F (36.8 C), temperature source Oral, resp. rate 18, SpO2 100.00%.  LABORATORY DATA: Lab Results    Component Value Date   WBC 6.3 03/31/2014   HGB 11.0* 03/31/2014   HCT 32.9* 03/31/2014   MCV 94.9 03/31/2014   PLT 343 03/31/2014      Chemistry      Component Value Date/Time   NA 141 03/31/2014 1019   NA 138 11/16/2013 0530   K 3.8 03/31/2014 1019   K 3.5* 11/16/2013 0530   CL 100 11/16/2013 0530   CO2 27 03/31/2014 1019   CO2 28 11/16/2013 0530   BUN 11.6 03/31/2014 1019   BUN 13 11/16/2013 0530   CREATININE 0.7 03/31/2014 1019   CREATININE 0.48* 11/16/2013 0530      Component Value Date/Time   CALCIUM 9.7 03/31/2014 1019   CALCIUM 8.6 11/16/2013 0530   ALKPHOS 75 03/31/2014 1019   ALKPHOS 145* 11/16/2013 0530   AST 11 03/31/2014 1019   AST 21 11/16/2013 0530   ALT 8 03/31/2014 1019   ALT 36* 11/16/2013 0530   BILITOT 0.36 03/31/2014 1019   BILITOT 0.2* 11/16/2013 0530       RADIOGRAPHIC STUDIES: Ct Chest W Contrast  03/03/2014   CLINICAL DATA:  Lung cancer.  EXAM: CT CHEST, ABDOMEN, AND PELVIS WITH CONTRAST  TECHNIQUE: Multidetector CT imaging of the chest, abdomen and pelvis was performed following the  standard protocol during bolus administration of intravenous contrast.  CONTRAST:  168mL OMNIPAQUE IOHEXOL 300 MG/ML  SOLN  COMPARISON:  11/08/2013.  FINDINGS: CT CHEST FINDINGS  Soft tissue / Mediastinum: There is no axillary lymphadenopathy. No mediastinal Mid were left hilar lymphadenopathy. 13 mm right hilar lymph node median measured previously is now 10 mm in the same dimension. Heart size is normal. No pericardial effusion. Thoracic esophagus is normal in appearance. Small lymph nodes adjacent to the distal esophagus are unchanged.  Lungs / Pleura: Marked upper lobe emphysema is noted bilaterally. 3.6 x 3.6 cm right upper lobe lung mass seen on the previous study has decreased in size to 2.2 x 2.3 cm on today's exam. With no new nodule or mass in either lung. There is no pulmonary edema or focal airspace consolidation. No pleural effusion.  Bones: Bone windows reveal no worrisome lytic or sclerotic  osseous lesions.  CT ABDOMEN AND PELVIS FINDINGS  Liver:  Normal.  Spleen: Normal.  Stomach: Normal.  Pancreas: Normal without mass lesion or main duct dilatation.  Gallbladder/Biliary: No gallstones. No intra or extrahepatic biliary duct dilatation.  Kidneys/Adrenals: No adrenal nodule or mass. The kidneys are normal bilaterally.  Bowel Loops: E duodenum is normal. Small bowel loops have normal imaging features. The terminal ileum is normal. The appendix is normal. Right colon is normal. A sigmoid end colostomy is evident. Hartmann's pouch has normal CT imaging features.  Nodes: No abdominal lymphadenopathy. There is no pelvic sidewall lymphadenopathy.  Vasculature: Atherosclerotic calcification is noted in the wall of the abdominal aorta without aneurysm.  Pelvic Genitourinary: Bladder is normal in appearance. Uterus is surgically absent. There is no adnexal mass.  Bones/Musculoskeletal: Bone windows reveal no worrisome lytic or sclerotic osseous lesions.  Body Wall: No evidence for abdominal wall hernia.  Other: No intraperitoneal free fluid.  IMPRESSION: Interval decrease in size of the right upper lobe lung mass with decreasing right hilar lymphadenopathy.  Otherwise stable CT exam of the chest, abdomen, and pelvis.   Electronically Signed   By: Misty Stanley M.D.   On: 03/03/2014 12:35   Ct Abdomen Pelvis W Contrast  03/03/2014   CLINICAL DATA:  Lung cancer.  EXAM: CT CHEST, ABDOMEN, AND PELVIS WITH CONTRAST  TECHNIQUE: Multidetector CT imaging of the chest, abdomen and pelvis was performed following the standard protocol during bolus administration of intravenous contrast.  CONTRAST:  156mL OMNIPAQUE IOHEXOL 300 MG/ML  SOLN  COMPARISON:  11/08/2013.  FINDINGS: CT CHEST FINDINGS  Soft tissue / Mediastinum: There is no axillary lymphadenopathy. No mediastinal Mid were left hilar lymphadenopathy. 13 mm right hilar lymph node median measured previously is now 10 mm in the same dimension. Heart size is normal.  No pericardial effusion. Thoracic esophagus is normal in appearance. Small lymph nodes adjacent to the distal esophagus are unchanged.  Lungs / Pleura: Marked upper lobe emphysema is noted bilaterally. 3.6 x 3.6 cm right upper lobe lung mass seen on the previous study has decreased in size to 2.2 x 2.3 cm on today's exam. With no new nodule or mass in either lung. There is no pulmonary edema or focal airspace consolidation. No pleural effusion.  Bones: Bone windows reveal no worrisome lytic or sclerotic osseous lesions.  CT ABDOMEN AND PELVIS FINDINGS  Liver:  Normal.  Spleen: Normal.  Stomach: Normal.  Pancreas: Normal without mass lesion or main duct dilatation.  Gallbladder/Biliary: No gallstones. No intra or extrahepatic biliary duct dilatation.  Kidneys/Adrenals: No adrenal nodule or mass. The kidneys  are normal bilaterally.  Bowel Loops: E duodenum is normal. Small bowel loops have normal imaging features. The terminal ileum is normal. The appendix is normal. Right colon is normal. A sigmoid end colostomy is evident. Hartmann's pouch has normal CT imaging features.  Nodes: No abdominal lymphadenopathy. There is no pelvic sidewall lymphadenopathy.  Vasculature: Atherosclerotic calcification is noted in the wall of the abdominal aorta without aneurysm.  Pelvic Genitourinary: Bladder is normal in appearance. Uterus is surgically absent. There is no adnexal mass.  Bones/Musculoskeletal: Bone windows reveal no worrisome lytic or sclerotic osseous lesions.  Body Wall: No evidence for abdominal wall hernia.  Other: No intraperitoneal free fluid.  IMPRESSION: Interval decrease in size of the right upper lobe lung mass with decreasing right hilar lymphadenopathy.  Otherwise stable CT exam of the chest, abdomen, and pelvis.   Electronically Signed   By: Misty Stanley M.D.   On: 03/03/2014 12:35   ASSESSMENT AND PLAN: This is a very pleasant 63 years old white female recently diagnosed with a stage IV non-small cell  lung cancer, adenocarcinoma with negative EGFR mutation and negative ALK gene translocation. The patient is status post stereotactic radiotherapy to a solitary brain metastases in addition to palliative radiotherapy to the right upper lobe lung mass.  The patient is currently undergoing systemic chemotherapy with carboplatin and Alimta status post 4 cycles and tolerating her treatment fairly well. The patient was discussed with and also seen by Dr. Julien Nordmann. Her restaging CT scan after 3 cycles revealed further decrease in the right upper lobe lung mass with decreasing hilar lymphadenopathy. Will proceed with cycle #5 today as scheduled.  She will return in 3 weeks prior to cycle #6.  For depression, the patient will continue on Remeron 30 mg by mouth each bedtime. The patient was advised to call immediately if she has any concerning symptoms in the interval.  The patient voices understanding of current disease status and treatment options and is in agreement with the current care plan.  All questions were answered. The patient knows to call the clinic with any problems, questions or concerns. We can certainly see the patient much sooner if necessary.  Mikey Bussing, DNP, AGPCNP-BC  ADDENDUM: Hematology/Oncology Attending: I had a face to face encounter with the patient. I recommended her care plan. This is a very pleasant 63 years old white female with metastatic non-small cell lung cancer, adenocarcinoma. She is currently undergoing systemic chemotherapy with carboplatin and Alimta status post 4 cycles. The patient tolerated the last cycle of her treatment fairly well with no significant adverse effects. I recommended for her to proceed with cycle #5 today as scheduled. She would come back for followup visit in 3 weeks with the start of cycle #6. She was advised to call immediately if she has any concerning symptoms in the interval.  Disclaimer: This note was dictated with voice recognition  software. Similar sounding words can inadvertently be transcribed and may not be corrected upon review. Eilleen Kempf., MD 03/31/2014

## 2014-03-31 NOTE — Patient Instructions (Signed)
[facility] Discharge Instructions for Patients Receiving Chemotherapy  Today you received the following chemotherapy agents:  Alimta and Carboplatin  To help prevent nausea and vomiting after your treatment, we encourage you to take your nausea medication as ordered per MD.   If you develop nausea and vomiting that is not controlled by your nausea medication, call the clinic.   BELOW ARE SYMPTOMS THAT SHOULD BE REPORTED IMMEDIATELY:  *FEVER GREATER THAN 100.5 F  *CHILLS WITH OR WITHOUT FEVER  NAUSEA AND VOMITING THAT IS NOT CONTROLLED WITH YOUR NAUSEA MEDICATION  *UNUSUAL SHORTNESS OF BREATH  *UNUSUAL BRUISING OR BLEEDING  TENDERNESS IN MOUTH AND THROAT WITH OR WITHOUT PRESENCE OF ULCERS  *URINARY PROBLEMS  *BOWEL PROBLEMS  UNUSUAL RASH Items with * indicate a potential emergency and should be followed up as soon as possible.  Feel free to call the clinic you have any questions or concerns. The clinic phone number is [phone].

## 2014-04-07 ENCOUNTER — Other Ambulatory Visit (HOSPITAL_BASED_OUTPATIENT_CLINIC_OR_DEPARTMENT_OTHER): Payer: BC Managed Care – PPO

## 2014-04-07 DIAGNOSIS — C3410 Malignant neoplasm of upper lobe, unspecified bronchus or lung: Secondary | ICD-10-CM

## 2014-04-07 LAB — CBC WITH DIFFERENTIAL/PLATELET
BASO%: 0.6 % (ref 0.0–2.0)
Basophils Absolute: 0 10*3/uL (ref 0.0–0.1)
EOS%: 2 % (ref 0.0–7.0)
Eosinophils Absolute: 0.1 10*3/uL (ref 0.0–0.5)
HCT: 29.5 % — ABNORMAL LOW (ref 34.8–46.6)
HGB: 9.9 g/dL — ABNORMAL LOW (ref 11.6–15.9)
LYMPH%: 28 % (ref 14.0–49.7)
MCH: 32.3 pg (ref 25.1–34.0)
MCHC: 33.6 g/dL (ref 31.5–36.0)
MCV: 95.9 fL (ref 79.5–101.0)
MONO#: 0.4 10*3/uL (ref 0.1–0.9)
MONO%: 15.5 % — ABNORMAL HIGH (ref 0.0–14.0)
NEUT#: 1.5 10*3/uL (ref 1.5–6.5)
NEUT%: 53.9 % (ref 38.4–76.8)
Platelets: 281 10*3/uL (ref 145–400)
RBC: 3.08 10*6/uL — ABNORMAL LOW (ref 3.70–5.45)
RDW: 18.4 % — ABNORMAL HIGH (ref 11.2–14.5)
WBC: 2.8 10*3/uL — ABNORMAL LOW (ref 3.9–10.3)
lymph#: 0.8 10*3/uL — ABNORMAL LOW (ref 0.9–3.3)

## 2014-04-07 LAB — COMPREHENSIVE METABOLIC PANEL
ALT: 12 U/L (ref 0–35)
AST: 11 U/L (ref 0–37)
Albumin: 3.8 g/dL (ref 3.5–5.2)
Alkaline Phosphatase: 62 U/L (ref 39–117)
BUN: 15 mg/dL (ref 6–23)
CO2: 28 mEq/L (ref 19–32)
Calcium: 9.2 mg/dL (ref 8.4–10.5)
Chloride: 102 mEq/L (ref 96–112)
Creatinine, Ser: 0.52 mg/dL (ref 0.50–1.10)
Glucose, Bld: 93 mg/dL (ref 70–99)
Potassium: 3.8 mEq/L (ref 3.5–5.3)
Sodium: 138 mEq/L (ref 135–145)
Total Bilirubin: 0.6 mg/dL (ref 0.2–1.2)
Total Protein: 6.2 g/dL (ref 6.0–8.3)

## 2014-04-14 ENCOUNTER — Other Ambulatory Visit (HOSPITAL_BASED_OUTPATIENT_CLINIC_OR_DEPARTMENT_OTHER): Payer: BC Managed Care – PPO

## 2014-04-14 DIAGNOSIS — R5383 Other fatigue: Secondary | ICD-10-CM

## 2014-04-14 DIAGNOSIS — C3410 Malignant neoplasm of upper lobe, unspecified bronchus or lung: Secondary | ICD-10-CM

## 2014-04-14 DIAGNOSIS — R5381 Other malaise: Secondary | ICD-10-CM

## 2014-04-14 DIAGNOSIS — C7949 Secondary malignant neoplasm of other parts of nervous system: Secondary | ICD-10-CM

## 2014-04-14 DIAGNOSIS — C7931 Secondary malignant neoplasm of brain: Secondary | ICD-10-CM

## 2014-04-14 LAB — COMPREHENSIVE METABOLIC PANEL (CC13)
ALT: 10 U/L (ref 0–55)
AST: 12 U/L (ref 5–34)
Albumin: 3.5 g/dL (ref 3.5–5.0)
Alkaline Phosphatase: 67 U/L (ref 40–150)
Anion Gap: 8 mEq/L (ref 3–11)
BUN: 14.6 mg/dL (ref 7.0–26.0)
CO2: 28 mEq/L (ref 22–29)
Calcium: 9.2 mg/dL (ref 8.4–10.4)
Chloride: 104 mEq/L (ref 98–109)
Creatinine: 0.7 mg/dL (ref 0.6–1.1)
Glucose: 103 mg/dl (ref 70–140)
Potassium: 4 mEq/L (ref 3.5–5.1)
Sodium: 140 mEq/L (ref 136–145)
Total Bilirubin: 0.33 mg/dL (ref 0.20–1.20)
Total Protein: 6.5 g/dL (ref 6.4–8.3)

## 2014-04-14 LAB — CBC WITH DIFFERENTIAL/PLATELET
BASO%: 0.3 % (ref 0.0–2.0)
Basophils Absolute: 0 10*3/uL (ref 0.0–0.1)
EOS%: 0.7 % (ref 0.0–7.0)
Eosinophils Absolute: 0 10*3/uL (ref 0.0–0.5)
HCT: 28.7 % — ABNORMAL LOW (ref 34.8–46.6)
HGB: 9.6 g/dL — ABNORMAL LOW (ref 11.6–15.9)
LYMPH%: 17.5 % (ref 14.0–49.7)
MCH: 32.9 pg (ref 25.1–34.0)
MCHC: 33.4 g/dL (ref 31.5–36.0)
MCV: 98.7 fL (ref 79.5–101.0)
MONO#: 0.7 10*3/uL (ref 0.1–0.9)
MONO%: 16.1 % — ABNORMAL HIGH (ref 0.0–14.0)
NEUT#: 2.8 10*3/uL (ref 1.5–6.5)
NEUT%: 65.4 % (ref 38.4–76.8)
Platelets: 119 10*3/uL — ABNORMAL LOW (ref 145–400)
RBC: 2.9 10*6/uL — ABNORMAL LOW (ref 3.70–5.45)
RDW: 18.6 % — ABNORMAL HIGH (ref 11.2–14.5)
WBC: 4.2 10*3/uL (ref 3.9–10.3)
lymph#: 0.7 10*3/uL — ABNORMAL LOW (ref 0.9–3.3)

## 2014-04-21 ENCOUNTER — Encounter: Payer: Self-pay | Admitting: Physician Assistant

## 2014-04-21 ENCOUNTER — Ambulatory Visit (HOSPITAL_BASED_OUTPATIENT_CLINIC_OR_DEPARTMENT_OTHER): Payer: BC Managed Care – PPO | Admitting: Physician Assistant

## 2014-04-21 ENCOUNTER — Ambulatory Visit (HOSPITAL_BASED_OUTPATIENT_CLINIC_OR_DEPARTMENT_OTHER): Payer: BC Managed Care – PPO

## 2014-04-21 ENCOUNTER — Other Ambulatory Visit (HOSPITAL_BASED_OUTPATIENT_CLINIC_OR_DEPARTMENT_OTHER): Payer: BC Managed Care – PPO

## 2014-04-21 ENCOUNTER — Telehealth: Payer: Self-pay | Admitting: Internal Medicine

## 2014-04-21 VITALS — BP 110/49 | HR 67 | Temp 98.0°F | Resp 19 | Ht 64.0 in | Wt 120.7 lb

## 2014-04-21 DIAGNOSIS — C7931 Secondary malignant neoplasm of brain: Secondary | ICD-10-CM

## 2014-04-21 DIAGNOSIS — C7949 Secondary malignant neoplasm of other parts of nervous system: Secondary | ICD-10-CM

## 2014-04-21 DIAGNOSIS — C3410 Malignant neoplasm of upper lobe, unspecified bronchus or lung: Secondary | ICD-10-CM

## 2014-04-21 DIAGNOSIS — F329 Major depressive disorder, single episode, unspecified: Secondary | ICD-10-CM

## 2014-04-21 DIAGNOSIS — F3289 Other specified depressive episodes: Secondary | ICD-10-CM

## 2014-04-21 DIAGNOSIS — Z5111 Encounter for antineoplastic chemotherapy: Secondary | ICD-10-CM

## 2014-04-21 LAB — CBC WITH DIFFERENTIAL/PLATELET
BASO%: 0 % (ref 0.0–2.0)
Basophils Absolute: 0 10*3/uL (ref 0.0–0.1)
EOS%: 0 % (ref 0.0–7.0)
Eosinophils Absolute: 0 10*3/uL (ref 0.0–0.5)
HCT: 30.8 % — ABNORMAL LOW (ref 34.8–46.6)
HGB: 10.4 g/dL — ABNORMAL LOW (ref 11.6–15.9)
LYMPH%: 9.2 % — ABNORMAL LOW (ref 14.0–49.7)
MCH: 33.8 pg (ref 25.1–34.0)
MCHC: 33.8 g/dL (ref 31.5–36.0)
MCV: 100 fL (ref 79.5–101.0)
MONO#: 0.3 10*3/uL (ref 0.1–0.9)
MONO%: 4.4 % (ref 0.0–14.0)
NEUT#: 4.9 10*3/uL (ref 1.5–6.5)
NEUT%: 86.4 % — ABNORMAL HIGH (ref 38.4–76.8)
Platelets: 314 10*3/uL (ref 145–400)
RBC: 3.08 10*6/uL — ABNORMAL LOW (ref 3.70–5.45)
RDW: 18.8 % — ABNORMAL HIGH (ref 11.2–14.5)
WBC: 5.6 10*3/uL (ref 3.9–10.3)
lymph#: 0.5 10*3/uL — ABNORMAL LOW (ref 0.9–3.3)
nRBC: 0 % (ref 0–0)

## 2014-04-21 LAB — COMPREHENSIVE METABOLIC PANEL (CC13)
ALT: 8 U/L (ref 0–55)
AST: 10 U/L (ref 5–34)
Albumin: 4 g/dL (ref 3.5–5.0)
Alkaline Phosphatase: 80 U/L (ref 40–150)
Anion Gap: 10 mEq/L (ref 3–11)
BUN: 15.3 mg/dL (ref 7.0–26.0)
CO2: 24 mEq/L (ref 22–29)
Calcium: 9.8 mg/dL (ref 8.4–10.4)
Chloride: 105 mEq/L (ref 98–109)
Creatinine: 0.7 mg/dL (ref 0.6–1.1)
Glucose: 133 mg/dl (ref 70–140)
Potassium: 3.9 mEq/L (ref 3.5–5.1)
Sodium: 140 mEq/L (ref 136–145)
Total Bilirubin: 0.37 mg/dL (ref 0.20–1.20)
Total Protein: 7.3 g/dL (ref 6.4–8.3)

## 2014-04-21 MED ORDER — DEXAMETHASONE SODIUM PHOSPHATE 20 MG/5ML IJ SOLN
INTRAMUSCULAR | Status: AC
  Filled 2014-04-21: qty 5

## 2014-04-21 MED ORDER — SODIUM CHLORIDE 0.9 % IV SOLN
406.0000 mg | Freq: Once | INTRAVENOUS | Status: AC
  Administered 2014-04-21: 410 mg via INTRAVENOUS
  Filled 2014-04-21: qty 41

## 2014-04-21 MED ORDER — DEXAMETHASONE SODIUM PHOSPHATE 20 MG/5ML IJ SOLN
20.0000 mg | Freq: Once | INTRAMUSCULAR | Status: AC
  Administered 2014-04-21: 20 mg via INTRAVENOUS

## 2014-04-21 MED ORDER — ONDANSETRON 16 MG/50ML IVPB (CHCC)
16.0000 mg | Freq: Once | INTRAVENOUS | Status: AC
  Administered 2014-04-21: 16 mg via INTRAVENOUS

## 2014-04-21 MED ORDER — CYANOCOBALAMIN 1000 MCG/ML IJ SOLN
INTRAMUSCULAR | Status: AC
  Filled 2014-04-21: qty 1

## 2014-04-21 MED ORDER — ONDANSETRON 16 MG/50ML IVPB (CHCC)
INTRAVENOUS | Status: AC
  Filled 2014-04-21: qty 16

## 2014-04-21 MED ORDER — SODIUM CHLORIDE 0.9 % IV SOLN
Freq: Once | INTRAVENOUS | Status: AC
  Administered 2014-04-21: 12:00:00 via INTRAVENOUS

## 2014-04-21 MED ORDER — SODIUM CHLORIDE 0.9 % IV SOLN
500.0000 mg/m2 | Freq: Once | INTRAVENOUS | Status: AC
  Administered 2014-04-21: 750 mg via INTRAVENOUS
  Filled 2014-04-21: qty 30

## 2014-04-21 MED ORDER — CYANOCOBALAMIN 1000 MCG/ML IJ SOLN
1000.0000 ug | Freq: Once | INTRAMUSCULAR | Status: AC
  Administered 2014-04-21: 1000 ug via INTRAMUSCULAR

## 2014-04-21 NOTE — Progress Notes (Signed)
[place] [phone]   Fax:([phone]) [place], MD [address] [place] [address]  DIAGNOSIS: Stage IV (T2a, N0, M1b) non-small cell lung cancer consistent with adenocarcinoma with negative EGFR mutation and negative ALK gene translocation diagnosed in January of 2015 presented with right upper lobe lung mass in addition to a solitary brain metastasis status post stereotactic to a solitary brain metastasis as well as radiotherapy to the right upper lobe lung mass.  PRIOR THERAPY:  1) Status post stereotactic radiotherapy to a solitary right parietal brain lesion under the care of Dr. [name] on 10/16/2013. 2) Status post palliative radiotherapy to the right lung tumor under the care of Dr. [name] completed on 12/05/2013.  CURRENT THERAPY: Systemic chemotherapy with carboplatin for AUC of 5 and Alimta 500 mg/M2 every 3 weeks. First dose Jan 06 2014. Status post 5 cycles.  INTERVAL HISTORY: Alexis [name] 63 y.o. female returns to the clinic today for followup visit accompanied by her husband.  The patient tolerated the last cycle of her systemic chemotherapy with carboplatin and Alimta fairly well. She has a few days of malaise and decreased appetite following chemotherapy but returns to her baseline.She denied having any significant chest pain, shortness breath, cough or hemoptysis. She has no fever or chills, no nausea or vomiting. She denied having any significant weight loss or night sweats. She is here today to start cycle #6 of her chemotherapy. She is still wanting her colostomy reversed. This will be determined and managed by Dr. Zella Richer.  MEDICAL HISTORY: Past Medical History  Diagnosis Date  . Cancer     lung, hx cervicle  . Ileus, postoperative 11/18/2013  . Physical deconditioning 11/18/2013  . Severe protein-calorie malnutrition 11/18/2013  . Anemia 11/18/2013  . History of radiation therapy 10-28-13- 12-05-13     lung ca 50 Gy/92f    ALLERGIES:  is allergic to codeine.  MEDICATIONS:  Current Outpatient Prescriptions  Medication Sig Dispense Refill  . dexamethasone (DECADRON) 4 MG tablet 4 mg by mouth twice a day the day before, day of and day after the chemotherapy every 3 weeks  40 tablet  1  . folic acid (FOLVITE) 1 MG tablet Take 1 tablet (1 mg total) by mouth daily.  30 tablet  4  . Multiple Vitamin (MULTIVITAMIN WITH MINERALS) TABS tablet Take 1 daily with lunch and a meal.  Make sure it has iron for women.      .Marland KitchenALPRAZolam (XANAX) 0.5 MG tablet One four times a day as needed for nerves or pain  120 tablet  0  . emollient (BIAFINE) cream Apply 1 application topically daily. Apply daily after rad tx      . HYDROcodone-acetaminophen (NORCO) 10-325 MG per tablet Take 1-2 tablets by mouth every 6 (six) hours as needed for moderate pain.  30 tablet  0  . ibuprofen (ADVIL,MOTRIN) 200 MG tablet You can take 2-3 tablets every 6 hours as needed for pain.  30 tablet  0  . mirtazapine (REMERON) 30 MG tablet Take 1 tablet (30 mg total) by mouth at bedtime.  30 tablet  2  . omeprazole (PRILOSEC) 20 MG capsule Take 20 mg by mouth daily.      . ondansetron (ZOFRAN) 8 MG tablet Take 1 tablet (8 mg total) by mouth every 8 (eight) hours as needed for nausea or vomiting.  30 tablet  0  . potassium chloride SA (K-DUR,KLOR-CON)  20 MEQ tablet Take 1 tablet (20 mEq total) by mouth daily.  7 tablet  0  . prochlorperazine (COMPAZINE) 10 MG tablet Take 1 tablet (10 mg total) by mouth every 6 (six) hours as needed for nausea or vomiting.  60 tablet  0   No current facility-administered medications for this visit.    SURGICAL HISTORY:  Past Surgical History  Procedure Laterality Date  . Video bronchoscopy Bilateral 08/30/2013    Procedure: VIDEO BRONCHOSCOPY WITH FLUORO;  Surgeon: Tanda Rockers, MD;  Location: [facility];  Service: Cardiopulmonary;  Laterality: Bilateral;  . Abdominal hysterectomy    .  Laparotomy N/A 11/03/2013    Procedure: EXPLORATORY LAPAROTOMY, DRAINAGE OF INTRA  ABDOMINAL ABSCESSES, MOBILIZATION OF SPLENIC FLEXURE, SIGMOID COLECTOMY WITH COLOSTOMY;  Surgeon: Odis Hollingshead, MD;  Location: [facility];  Service: General;  Laterality: N/A;    REVIEW OF SYSTEMS:  Constitutional: positive for anorexia, fatigue and malaise Eyes: negative Ears, nose, mouth, throat, and face: negative Respiratory: negative Cardiovascular: negative Gastrointestinal: negative Genitourinary:negative Integument/breast: negative Hematologic/lymphatic: negative Musculoskeletal:negative Neurological: positive for weakness Behavioral/Psych: positive for depression Endocrine: negative Allergic/Immunologic: negative   PHYSICAL EXAMINATION: General appearance: alert, cooperative, fatigued and no distress Head: Normocephalic, without obvious abnormality, atraumatic Neck: no adenopathy, no JVD, supple, symmetrical, trachea midline and thyroid not enlarged, symmetric, no tenderness/mass/nodules Lymph nodes: Cervical, supraclavicular, and axillary nodes normal. Resp: clear to auscultation bilaterally Back: symmetric, no curvature. ROM normal. No CVA tenderness. Cardio: regular rate and rhythm, S1, S2 normal, no murmur, click, rub or gallop GI: soft, non-tender; bowel sounds normal; no masses,  no organomegaly and left colostomy in place Extremities: extremities normal, atraumatic, no cyanosis or edema Neurologic: Alert and oriented X 3, normal strength and tone. Normal symmetric reflexes. Normal coordination and gait  ECOG PERFORMANCE STATUS: 2 - Symptomatic, <50% confined to bed  Blood pressure 110/49, pulse 67, temperature 98 F (36.7 C), temperature source Oral, resp. rate 19, height 5' 4" (1.626 m), weight 120 lb 11.2 oz (54.749 kg).  LABORATORY DATA: Lab Results  Component Value Date   WBC 5.6 04/21/2014   HGB 10.4* 04/21/2014   HCT 30.8* 04/21/2014   MCV 100.0 04/21/2014   PLT 314 04/21/2014        Chemistry      Component Value Date/Time   NA 140 04/21/2014 1014   NA 138 04/07/2014 1037   K 3.9 04/21/2014 1014   K 3.8 04/07/2014 1037   CL 102 04/07/2014 1037   CO2 24 04/21/2014 1014   CO2 28 04/07/2014 1037   BUN 15.3 04/21/2014 1014   BUN 15 04/07/2014 1037   CREATININE 0.7 04/21/2014 1014   CREATININE 0.52 04/07/2014 1037      Component Value Date/Time   CALCIUM 9.8 04/21/2014 1014   CALCIUM 9.2 04/07/2014 1037   ALKPHOS 80 04/21/2014 1014   ALKPHOS 62 04/07/2014 1037   AST 10 04/21/2014 1014   AST 11 04/07/2014 1037   ALT 8 04/21/2014 1014   ALT 12 04/07/2014 1037   BILITOT 0.37 04/21/2014 1014   BILITOT 0.6 04/07/2014 1037       RADIOGRAPHIC STUDIES: Ct Chest W Contrast  03/03/2014   CLINICAL DATA:  Lung cancer.  EXAM: CT CHEST, ABDOMEN, AND PELVIS WITH CONTRAST  TECHNIQUE: Multidetector CT imaging of the chest, abdomen and pelvis was performed following the standard protocol during bolus administration of intravenous contrast.  CONTRAST:  150m OMNIPAQUE IOHEXOL 300 MG/ML  SOLN  COMPARISON:  11/08/2013.  FINDINGS: CT CHEST  FINDINGS  Soft tissue / Mediastinum: There is no axillary lymphadenopathy. No mediastinal Mid were left hilar lymphadenopathy. 13 mm right hilar lymph node median measured previously is now 10 mm in the same dimension. Heart size is normal. No pericardial effusion. Thoracic esophagus is normal in appearance. Small lymph nodes adjacent to the distal esophagus are unchanged.  Lungs / Pleura: Marked upper lobe emphysema is noted bilaterally. 3.6 x 3.6 cm right upper lobe lung mass seen on the previous study has decreased in size to 2.2 x 2.3 cm on today's exam. With no new nodule or mass in either lung. There is no pulmonary edema or focal airspace consolidation. No pleural effusion.  Bones: Bone windows reveal no worrisome lytic or sclerotic osseous lesions.  CT ABDOMEN AND PELVIS FINDINGS  Liver:  Normal.  Spleen: Normal.  Stomach: Normal.  Pancreas: Normal without  mass lesion or main duct dilatation.  Gallbladder/Biliary: No gallstones. No intra or extrahepatic biliary duct dilatation.  Kidneys/Adrenals: No adrenal nodule or mass. The kidneys are normal bilaterally.  Bowel Loops: E duodenum is normal. Small bowel loops have normal imaging features. The terminal ileum is normal. The appendix is normal. Right colon is normal. A sigmoid end colostomy is evident. Hartmann's pouch has normal CT imaging features.  Nodes: No abdominal lymphadenopathy. There is no pelvic sidewall lymphadenopathy.  Vasculature: Atherosclerotic calcification is noted in the wall of the abdominal aorta without aneurysm.  Pelvic Genitourinary: Bladder is normal in appearance. Uterus is surgically absent. There is no adnexal mass.  Bones/Musculoskeletal: Bone windows reveal no worrisome lytic or sclerotic osseous lesions.  Body Wall: No evidence for abdominal wall hernia.  Other: No intraperitoneal free fluid.  IMPRESSION: Interval decrease in size of the right upper lobe lung mass with decreasing right hilar lymphadenopathy.  Otherwise stable CT exam of the chest, abdomen, and pelvis.   Electronically Signed   By: Misty Stanley M.D.   On: 03/03/2014 12:35   Ct Abdomen Pelvis W Contrast  03/03/2014   CLINICAL DATA:  Lung cancer.  EXAM: CT CHEST, ABDOMEN, AND PELVIS WITH CONTRAST  TECHNIQUE: Multidetector CT imaging of the chest, abdomen and pelvis was performed following the standard protocol during bolus administration of intravenous contrast.  CONTRAST:  118m OMNIPAQUE IOHEXOL 300 MG/ML  SOLN  COMPARISON:  11/08/2013.  FINDINGS: CT CHEST FINDINGS  Soft tissue / Mediastinum: There is no axillary lymphadenopathy. No mediastinal Mid were left hilar lymphadenopathy. 13 mm right hilar lymph node median measured previously is now 10 mm in the same dimension. Heart size is normal. No pericardial effusion. Thoracic esophagus is normal in appearance. Small lymph nodes adjacent to the distal esophagus are  unchanged.  Lungs / Pleura: Marked upper lobe emphysema is noted bilaterally. 3.6 x 3.6 cm right upper lobe lung mass seen on the previous study has decreased in size to 2.2 x 2.3 cm on today's exam. With no new nodule or mass in either lung. There is no pulmonary edema or focal airspace consolidation. No pleural effusion.  Bones: Bone windows reveal no worrisome lytic or sclerotic osseous lesions.  CT ABDOMEN AND PELVIS FINDINGS  Liver:  Normal.  Spleen: Normal.  Stomach: Normal.  Pancreas: Normal without mass lesion or main duct dilatation.  Gallbladder/Biliary: No gallstones. No intra or extrahepatic biliary duct dilatation.  Kidneys/Adrenals: No adrenal nodule or mass. The kidneys are normal bilaterally.  Bowel Loops: E duodenum is normal. Small bowel loops have normal imaging features. The terminal ileum is normal. The appendix is normal.  Right colon is normal. A sigmoid end colostomy is evident. Hartmann's pouch has normal CT imaging features.  Nodes: No abdominal lymphadenopathy. There is no pelvic sidewall lymphadenopathy.  Vasculature: Atherosclerotic calcification is noted in the wall of the abdominal aorta without aneurysm.  Pelvic Genitourinary: Bladder is normal in appearance. Uterus is surgically absent. There is no adnexal mass.  Bones/Musculoskeletal: Bone windows reveal no worrisome lytic or sclerotic osseous lesions.  Body Wall: No evidence for abdominal wall hernia.  Other: No intraperitoneal free fluid.  IMPRESSION: Interval decrease in size of the right upper lobe lung mass with decreasing right hilar lymphadenopathy.  Otherwise stable CT exam of the chest, abdomen, and pelvis.   Electronically Signed   By: Misty Stanley M.D.   On: 03/03/2014 12:35   ASSESSMENT AND PLAN: This is a very pleasant 63 years old white female recently diagnosed with a stage IV non-small cell lung cancer, adenocarcinoma with negative EGFR mutation and negative ALK gene translocation. The patient is status post  stereotactic radiotherapy to a solitary brain metastases in addition to palliative radiotherapy to the right upper lobe lung mass. The patient is currently undergoing systemic chemotherapy with carboplatin and Alimta status post 5 cycles and tolerating her treatment fairly well. The patient was discussed with and also seen by Dr. Julien Nordmann. Her last restaging CT scan revealed further decrease in the right upper lobe lung mass with decreasing hilar lymphadenopathy. Will proceed with cycle #6 today as scheduled.  She will return in 3 weeks with a restaging CT scan of the chest, abdomen and pelvis to re-evaluate her disease. She will continue with weekly labs as scheduled.  For depression, the patient will continue on Remeron 30 mg by mouth each bedtime. The patient was advised to call immediately if she has any concerning symptoms in the interval.  The patient voices understanding of current disease status and treatment options and is in agreement with the current care plan.  All questions were answered. The patient knows to call the clinic with any problems, questions or concerns. We can certainly see the patient much sooner if necessary.  Disclaimer: This note was dictated with voice recognition software. Similar sounding words can inadvertently be transcribed and may not be corrected upon review. Carlton Adam, PA-C 04/21/2014  ADDENDUM: Hematology/Oncology Attending: I had a face to face encounter with the patient today. I recommended her care plan. This is a very pleasant 63 years old white female with stage IV non-small cell lung cancer, adenocarcinoma currently undergoing systemic chemotherapy with carboplatin and Alimta status post 5 cycles. She is tolerating her treatment fairly well with no significant adverse effects. I recommended for the patient to proceed with cycle #6 today as scheduled. She would come back for follow up visit in 3 weeks after repeating CT scan of the chest, abdomen  and pelvis for restaging of her disease. For depression the patient will continue on Remeron as previously prescribed. She was advised to call immediately if she has any concerning symptoms in the interval.  Disclaimer: This note was dictated with voice recognition software. Similar sounding words can inadvertently be transcribed and may be missed upon review. Eilleen Kempf., MD 04/21/2014

## 2014-04-21 NOTE — Telephone Encounter (Signed)
gv adn rpitned appt sched and avs for pt fro Aug and SEpt....gv pt barium

## 2014-04-21 NOTE — Patient Instructions (Signed)
Continue labs as scheduled Follow up in 3 weeks with a restaging Ct scan of your chest, abdomen and pelvis to re-evaluate your disease.

## 2014-04-21 NOTE — Patient Instructions (Signed)
[facility] Discharge Instructions for Patients Receiving Chemotherapy  Today you received the following chemotherapy agents Alimtia, Carboplatin  To help prevent nausea and vomiting after your treatment, we encourage you to take your nausea medication  ZOFRAN 8 MG AS NEEDED EVERY EIGHT HOURS   If you develop nausea and vomiting that is not controlled by your nausea medication, call the clinic.   BELOW ARE SYMPTOMS THAT SHOULD BE REPORTED IMMEDIATELY:  *FEVER GREATER THAN 100.5 F  *CHILLS WITH OR WITHOUT FEVER  NAUSEA AND VOMITING THAT IS NOT CONTROLLED WITH YOUR NAUSEA MEDICATION  *UNUSUAL SHORTNESS OF BREATH  *UNUSUAL BRUISING OR BLEEDING  TENDERNESS IN MOUTH AND THROAT WITH OR WITHOUT PRESENCE OF ULCERS  *URINARY PROBLEMS  *BOWEL PROBLEMS  UNUSUAL RASH Items with * indicate a potential emergency and should be followed up as soon as possible.  Feel free to call the clinic you have any questions or concerns. The clinic phone number is [phone].

## 2014-04-22 ENCOUNTER — Ambulatory Visit
Admission: RE | Admit: 2014-04-22 | Discharge: 2014-04-22 | Disposition: A | Discharge status: Home / Self Care | Payer: BC Managed Care – PPO | Source: Ambulatory Visit | Attending: Radiation Oncology | Admitting: Radiation Oncology

## 2014-04-22 DIAGNOSIS — C7949 Secondary malignant neoplasm of other parts of nervous system: Principal | ICD-10-CM

## 2014-04-22 DIAGNOSIS — C7931 Secondary malignant neoplasm of brain: Secondary | ICD-10-CM

## 2014-04-22 MED ORDER — GADOBENATE DIMEGLUMINE 529 MG/ML IV SOLN
11.0000 mL | Freq: Once | INTRAVENOUS | Status: AC | PRN
  Administered 2014-04-22: 11 mL via INTRAVENOUS

## 2014-04-28 ENCOUNTER — Other Ambulatory Visit (HOSPITAL_BASED_OUTPATIENT_CLINIC_OR_DEPARTMENT_OTHER): Payer: BC Managed Care – PPO

## 2014-04-28 DIAGNOSIS — C3410 Malignant neoplasm of upper lobe, unspecified bronchus or lung: Secondary | ICD-10-CM

## 2014-04-28 LAB — COMPREHENSIVE METABOLIC PANEL (CC13)
ALT: 14 U/L (ref 0–55)
AST: 12 U/L (ref 5–34)
Albumin: 3.6 g/dL (ref 3.5–5.0)
Alkaline Phosphatase: 69 U/L (ref 40–150)
Anion Gap: 9 mEq/L (ref 3–11)
BUN: 19.9 mg/dL (ref 7.0–26.0)
CO2: 29 mEq/L (ref 22–29)
Calcium: 9.4 mg/dL (ref 8.4–10.4)
Chloride: 99 mEq/L (ref 98–109)
Creatinine: 0.7 mg/dL (ref 0.6–1.1)
Glucose: 107 mg/dl (ref 70–140)
Potassium: 4.1 mEq/L (ref 3.5–5.1)
Sodium: 137 mEq/L (ref 136–145)
Total Bilirubin: 0.78 mg/dL (ref 0.20–1.20)
Total Protein: 6.6 g/dL (ref 6.4–8.3)

## 2014-04-28 LAB — CBC WITH DIFFERENTIAL/PLATELET
BASO%: 0.3 % (ref 0.0–2.0)
Basophils Absolute: 0 10*3/uL (ref 0.0–0.1)
EOS%: 1.6 % (ref 0.0–7.0)
Eosinophils Absolute: 0.1 10*3/uL (ref 0.0–0.5)
HCT: 30.3 % — ABNORMAL LOW (ref 34.8–46.6)
HGB: 10.2 g/dL — ABNORMAL LOW (ref 11.6–15.9)
LYMPH%: 19.9 % (ref 14.0–49.7)
MCH: 34.2 pg — ABNORMAL HIGH (ref 25.1–34.0)
MCHC: 33.7 g/dL (ref 31.5–36.0)
MCV: 101.7 fL — ABNORMAL HIGH (ref 79.5–101.0)
MONO#: 0.5 10*3/uL (ref 0.1–0.9)
MONO%: 15.8 % — ABNORMAL HIGH (ref 0.0–14.0)
NEUT#: 2 10*3/uL (ref 1.5–6.5)
NEUT%: 62.4 % (ref 38.4–76.8)
Platelets: 277 10*3/uL (ref 145–400)
RBC: 2.98 10*6/uL — ABNORMAL LOW (ref 3.70–5.45)
RDW: 16.2 % — ABNORMAL HIGH (ref 11.2–14.5)
WBC: 3.2 10*3/uL — ABNORMAL LOW (ref 3.9–10.3)
lymph#: 0.6 10*3/uL — ABNORMAL LOW (ref 0.9–3.3)

## 2014-05-06 ENCOUNTER — Other Ambulatory Visit: Payer: BC Managed Care – PPO

## 2014-05-08 ENCOUNTER — Other Ambulatory Visit (HOSPITAL_BASED_OUTPATIENT_CLINIC_OR_DEPARTMENT_OTHER): Payer: BC Managed Care – PPO

## 2014-05-08 ENCOUNTER — Encounter (HOSPITAL_COMMUNITY): Payer: Self-pay

## 2014-05-08 ENCOUNTER — Ambulatory Visit (HOSPITAL_COMMUNITY)
Admission: RE | Admit: 2014-05-08 | Discharge: 2014-05-08 | Disposition: A | Discharge status: Home / Self Care | Payer: BC Managed Care – PPO | Source: Ambulatory Visit | Attending: Physician Assistant | Admitting: Physician Assistant

## 2014-05-08 DIAGNOSIS — C3410 Malignant neoplasm of upper lobe, unspecified bronchus or lung: Secondary | ICD-10-CM | POA: Insufficient documentation

## 2014-05-08 DIAGNOSIS — C7949 Secondary malignant neoplasm of other parts of nervous system: Secondary | ICD-10-CM

## 2014-05-08 DIAGNOSIS — C7931 Secondary malignant neoplasm of brain: Secondary | ICD-10-CM

## 2014-05-08 DIAGNOSIS — I7090 Unspecified atherosclerosis: Secondary | ICD-10-CM | POA: Diagnosis not present

## 2014-05-08 DIAGNOSIS — R911 Solitary pulmonary nodule: Secondary | ICD-10-CM | POA: Diagnosis not present

## 2014-05-08 DIAGNOSIS — J438 Other emphysema: Secondary | ICD-10-CM | POA: Diagnosis not present

## 2014-05-08 LAB — CBC WITH DIFFERENTIAL/PLATELET
BASO%: 0.3 % (ref 0.0–2.0)
Basophils Absolute: 0 10*3/uL (ref 0.0–0.1)
EOS%: 0.7 % (ref 0.0–7.0)
Eosinophils Absolute: 0 10*3/uL (ref 0.0–0.5)
HCT: 30.2 % — ABNORMAL LOW (ref 34.8–46.6)
HGB: 9.9 g/dL — ABNORMAL LOW (ref 11.6–15.9)
LYMPH%: 20.7 % (ref 14.0–49.7)
MCH: 34.6 pg — ABNORMAL HIGH (ref 25.1–34.0)
MCHC: 32.9 g/dL (ref 31.5–36.0)
MCV: 105.4 fL — ABNORMAL HIGH (ref 79.5–101.0)
MONO#: 0.6 10*3/uL (ref 0.1–0.9)
MONO%: 19.7 % — ABNORMAL HIGH (ref 0.0–14.0)
NEUT#: 1.8 10*3/uL (ref 1.5–6.5)
NEUT%: 58.6 % (ref 38.4–76.8)
Platelets: 136 10*3/uL — ABNORMAL LOW (ref 145–400)
RBC: 2.87 10*6/uL — ABNORMAL LOW (ref 3.70–5.45)
RDW: 17.6 % — ABNORMAL HIGH (ref 11.2–14.5)
WBC: 3.1 10*3/uL — ABNORMAL LOW (ref 3.9–10.3)
lymph#: 0.6 10*3/uL — ABNORMAL LOW (ref 0.9–3.3)

## 2014-05-08 LAB — COMPREHENSIVE METABOLIC PANEL (CC13)
ALT: 10 U/L (ref 0–55)
AST: 11 U/L (ref 5–34)
Albumin: 3.7 g/dL (ref 3.5–5.0)
Alkaline Phosphatase: 70 U/L (ref 40–150)
Anion Gap: 9 mEq/L (ref 3–11)
BUN: 10.3 mg/dL (ref 7.0–26.0)
CO2: 29 mEq/L (ref 22–29)
Calcium: 9.3 mg/dL (ref 8.4–10.4)
Chloride: 102 mEq/L (ref 98–109)
Creatinine: 0.7 mg/dL (ref 0.6–1.1)
Glucose: 90 mg/dl (ref 70–140)
Potassium: 3.8 mEq/L (ref 3.5–5.1)
Sodium: 139 mEq/L (ref 136–145)
Total Bilirubin: 0.37 mg/dL (ref 0.20–1.20)
Total Protein: 6.7 g/dL (ref 6.4–8.3)

## 2014-05-08 MED ORDER — IOHEXOL 300 MG/ML  SOLN
80.0000 mL | Freq: Once | INTRAMUSCULAR | Status: AC | PRN
  Administered 2014-05-08: 80 mL via INTRAVENOUS

## 2014-05-12 ENCOUNTER — Encounter: Payer: Self-pay | Admitting: Physician Assistant

## 2014-05-12 ENCOUNTER — Other Ambulatory Visit (HOSPITAL_BASED_OUTPATIENT_CLINIC_OR_DEPARTMENT_OTHER): Payer: BC Managed Care – PPO

## 2014-05-12 ENCOUNTER — Telehealth: Payer: Self-pay | Admitting: Internal Medicine

## 2014-05-12 ENCOUNTER — Ambulatory Visit (HOSPITAL_BASED_OUTPATIENT_CLINIC_OR_DEPARTMENT_OTHER): Payer: BC Managed Care – PPO | Admitting: Physician Assistant

## 2014-05-12 VITALS — BP 116/61 | HR 79 | Temp 98.3°F | Resp 18 | Ht 64.0 in | Wt 123.7 lb

## 2014-05-12 DIAGNOSIS — F329 Major depressive disorder, single episode, unspecified: Secondary | ICD-10-CM

## 2014-05-12 DIAGNOSIS — C7949 Secondary malignant neoplasm of other parts of nervous system: Secondary | ICD-10-CM

## 2014-05-12 DIAGNOSIS — F3289 Other specified depressive episodes: Secondary | ICD-10-CM

## 2014-05-12 DIAGNOSIS — C3410 Malignant neoplasm of upper lobe, unspecified bronchus or lung: Secondary | ICD-10-CM

## 2014-05-12 DIAGNOSIS — C7931 Secondary malignant neoplasm of brain: Secondary | ICD-10-CM

## 2014-05-12 DIAGNOSIS — Z23 Encounter for immunization: Secondary | ICD-10-CM

## 2014-05-12 LAB — COMPREHENSIVE METABOLIC PANEL (CC13)
ALT: 8 U/L (ref 0–55)
AST: 11 U/L (ref 5–34)
Albumin: 3.7 g/dL (ref 3.5–5.0)
Alkaline Phosphatase: 67 U/L (ref 40–150)
Anion Gap: 10 mEq/L (ref 3–11)
BUN: 8.5 mg/dL (ref 7.0–26.0)
CO2: 27 mEq/L (ref 22–29)
Calcium: 9.3 mg/dL (ref 8.4–10.4)
Chloride: 107 mEq/L (ref 98–109)
Creatinine: 0.7 mg/dL (ref 0.6–1.1)
Glucose: 108 mg/dl (ref 70–140)
Potassium: 3.7 mEq/L (ref 3.5–5.1)
Sodium: 144 mEq/L (ref 136–145)
Total Bilirubin: 0.29 mg/dL (ref 0.20–1.20)
Total Protein: 6.7 g/dL (ref 6.4–8.3)

## 2014-05-12 LAB — CBC WITH DIFFERENTIAL/PLATELET
BASO%: 0.3 % (ref 0.0–2.0)
Basophils Absolute: 0 10*3/uL (ref 0.0–0.1)
EOS%: 0.4 % (ref 0.0–7.0)
Eosinophils Absolute: 0 10*3/uL (ref 0.0–0.5)
HCT: 30.3 % — ABNORMAL LOW (ref 34.8–46.6)
HGB: 10 g/dL — ABNORMAL LOW (ref 11.6–15.9)
LYMPH%: 17 % (ref 14.0–49.7)
MCH: 35.1 pg — ABNORMAL HIGH (ref 25.1–34.0)
MCHC: 33.1 g/dL (ref 31.5–36.0)
MCV: 106 fL — ABNORMAL HIGH (ref 79.5–101.0)
MONO#: 0.7 10*3/uL (ref 0.1–0.9)
MONO%: 20.5 % — ABNORMAL HIGH (ref 0.0–14.0)
NEUT#: 2 10*3/uL (ref 1.5–6.5)
NEUT%: 61.8 % (ref 38.4–76.8)
Platelets: 245 10*3/uL (ref 145–400)
RBC: 2.86 10*6/uL — ABNORMAL LOW (ref 3.70–5.45)
RDW: 17.7 % — ABNORMAL HIGH (ref 11.2–14.5)
WBC: 3.3 10*3/uL — ABNORMAL LOW (ref 3.9–10.3)
lymph#: 0.6 10*3/uL — ABNORMAL LOW (ref 0.9–3.3)

## 2014-05-12 MED ORDER — INFLUENZA VAC SPLIT QUAD 0.5 ML IM SUSY
0.5000 mL | PREFILLED_SYRINGE | Freq: Once | INTRAMUSCULAR | Status: AC
  Administered 2014-05-12: 0.5 mL via INTRAMUSCULAR
  Filled 2014-05-12: qty 0.5

## 2014-05-12 NOTE — Patient Instructions (Addendum)
Your recent CT scan revealed stable disease. He will begin maintenance chemotherapy with single agent Alimta starting 05/19/2014. Followup with Dr. Zella Richer regarding reversal of your colostomy

## 2014-05-12 NOTE — Telephone Encounter (Signed)
gv adn pritned appt sched and avs for pt for Sept adn OCT.Marland KitchenMarland KitchenMarland Kitchen

## 2014-05-12 NOTE — Progress Notes (Addendum)
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  PRIOR THERAPY:  1) Status post stereotactic radiotherapy to a solitary right parietal brain lesion under the care of Dr. [name] on 10/16/2013. 2) Status post palliative radiotherapy to the right lung tumor under the care of Dr. [name] completed on 12/05/2013.  CURRENT THERAPY: Systemic chemotherapy with carboplatin for AUC of 5 and Alimta 500 mg/M2 every 3 weeks. First dose Jan 06 2014. Status post 6 cycles.  INTERVAL HISTORY: Alexis [name] 63 y.o. female returns to the clinic today for followup visit accompanied by her husband.  The patient tolerated the last cycle of her systemic chemotherapy with carboplatin and Alimta fairly well. She has a few days of malaise and decreased appetite following chemotherapy but returns to her baseline. She denied having any significant chest pain, shortness breath, cough or hemoptysis. She has no fever or chills, no nausea or vomiting. She denied having any significant weight loss or night sweats. She is still wanting her colostomy reversed. This will be determined and managed by Dr. Zella Richer. She recently had a restaging CT scan of the chest, abdomen pelvis and is here to discuss the results. She requests a flu vaccine today.  MEDICAL HISTORY: Past Medical History  Diagnosis Date  . Cancer     lung, hx cervicle  . Ileus, postoperative 11/18/2013  . Physical deconditioning 11/18/2013  . Severe protein-calorie  malnutrition 11/18/2013  . Anemia 11/18/2013  . History of radiation therapy 10-28-13- 12-05-13    lung ca 50 Gy/8f    ALLERGIES:  is allergic to codeine.  MEDICATIONS:  Current Outpatient Prescriptions  Medication Sig Dispense Refill  . dexamethasone (DECADRON) 4 MG tablet 4 mg by mouth twice a day the day before, day of and day after the chemotherapy every 3 weeks  40 tablet  1  . ibuprofen (ADVIL,MOTRIN) 200 MG tablet You can take 2-3 tablets every 6 hours as needed for pain.  30 tablet  0  . Multiple Vitamin (MULTIVITAMIN WITH MINERALS) TABS tablet Take 1 daily with lunch and a meal.  Make sure it has iron for women.      . ondansetron (ZOFRAN) 8 MG tablet Take 1 tablet (8 mg total) by mouth every 8 (eight) hours as needed for nausea or vomiting.  30 tablet  0  . prochlorperazine (COMPAZINE) 10 MG tablet Take 1 tablet (10 mg total) by mouth every 6 (six) hours as needed for nausea or vomiting.  60 tablet  0  . folic acid (FOLVITE) 1 MG tablet Take 1 tablet (1 mg total) by mouth daily.  30 tablet  4  . mirtazapine (REMERON) 30 MG tablet Take 1 tablet (30 mg total) by mouth at bedtime.  30 tablet  2  . potassium chloride SA (K-DUR,KLOR-CON) 20 MEQ tablet Take 1 tablet (20 mEq total) by mouth daily.  7 tablet  0   No current facility-administered medications for this visit.    SURGICAL HISTORY:  Past Surgical History  Procedure Laterality Date  .  Video bronchoscopy Bilateral 08/30/2013    Procedure: VIDEO BRONCHOSCOPY WITH FLUORO;  Surgeon: Tanda Rockers, MD;  Location: [facility];  Service: Cardiopulmonary;  Laterality: Bilateral;  . Abdominal hysterectomy    . Laparotomy N/A 11/03/2013    Procedure: EXPLORATORY LAPAROTOMY, DRAINAGE OF INTRA  ABDOMINAL ABSCESSES, MOBILIZATION OF SPLENIC FLEXURE, SIGMOID COLECTOMY WITH COLOSTOMY;  Surgeon: Odis Hollingshead, MD;  Location: [facility];  Service: General;  Laterality: N/A;    REVIEW OF SYSTEMS:  Constitutional: positive for fatigue Eyes:  negative Ears, nose, mouth, throat, and face: negative Respiratory: negative Cardiovascular: negative Gastrointestinal: negative Genitourinary:negative Integument/breast: negative Hematologic/lymphatic: negative Musculoskeletal:negative Neurological: positive for weakness and improved Behavioral/Psych: positive for depression Endocrine: negative Allergic/Immunologic: negative   PHYSICAL EXAMINATION: General appearance: alert, cooperative, fatigued and no distress Head: Normocephalic, without obvious abnormality, atraumatic Neck: no adenopathy, no JVD, supple, symmetrical, trachea midline and thyroid not enlarged, symmetric, no tenderness/mass/nodules Lymph nodes: Cervical, supraclavicular, and axillary nodes normal. Resp: clear to auscultation bilaterally Back: symmetric, no curvature. ROM normal. No CVA tenderness. Cardio: regular rate and rhythm, S1, S2 normal, no murmur, click, rub or gallop GI: soft, non-tender; bowel sounds normal; no masses,  no organomegaly and left colostomy in place Extremities: extremities normal, atraumatic, no cyanosis or edema Neurologic: Alert and oriented X 3, normal strength and tone. Normal symmetric reflexes. Normal coordination and gait  ECOG PERFORMANCE STATUS: 2 - Symptomatic, <50% confined to bed  Blood pressure 116/61, pulse 79, temperature 98.3 F (36.8 C), temperature source Oral, resp. rate 18, height 5' 4"  (1.626 m), weight 123 lb 11.2 oz (56.11 kg), SpO2 100.00%.  LABORATORY DATA: Lab Results  Component Value Date   WBC 3.3* 05/12/2014   HGB 10.0* 05/12/2014   HCT 30.3* 05/12/2014   MCV 106.0* 05/12/2014   PLT 245 05/12/2014      Chemistry      Component Value Date/Time   NA 144 05/12/2014 1032   NA 138 04/07/2014 1037   K 3.7 05/12/2014 1032   K 3.8 04/07/2014 1037   CL 102 04/07/2014 1037   CO2 27 05/12/2014 1032   CO2 28 04/07/2014 1037   BUN 8.5 05/12/2014 1032   BUN 15 04/07/2014 1037   CREATININE 0.7 05/12/2014 1032   CREATININE  0.52 04/07/2014 1037      Component Value Date/Time   CALCIUM 9.3 05/12/2014 1032   CALCIUM 9.2 04/07/2014 1037   ALKPHOS 67 05/12/2014 1032   ALKPHOS 62 04/07/2014 1037   AST 11 05/12/2014 1032   AST 11 04/07/2014 1037   ALT 8 05/12/2014 1032   ALT 12 04/07/2014 1037   BILITOT 0.29 05/12/2014 1032   BILITOT 0.6 04/07/2014 1037       RADIOGRAPHIC STUDIES: Ct Chest W Contrast  05/08/2014   CLINICAL DATA:  History of metastatic non-small-cell lung cancer. Restaging examination.  EXAM: CT CHEST, ABDOMEN, AND PELVIS WITH CONTRAST  TECHNIQUE: Multidetector CT imaging of the chest, abdomen and pelvis was performed following the standard protocol during bolus administration of intravenous contrast.  CONTRAST:  42m OMNIPAQUE IOHEXOL 300 MG/ML  SOLN  COMPARISON:  CT of the chest, abdomen and pelvis 03/03/2014.  FINDINGS: CT CHEST FINDINGS  Mediastinum: Previously noted right hilar lymph node is unchanged measuring 1 cm short axis (image 28 of series 2). No other definite pathologically enlarged mediastinal or left hilar lymph nodes are noted. Heart size is normal. No significant pericardial fluid, thickening or pericardial calcification. Atherosclerotic calcifications in the thoracic aorta and great vessels of the mediastinum.  Esophagus is unremarkable in appearance.  Lungs/Pleura: The previously described right upper lobe nodule is stable in size. On image 14 of series for when measured in a similar fashion on the prior study, the lesion is unchanged measuring 2.2 x 2.3 cm. This continues have spiculated margins with retraction of the overlying pleura. No other new suspicious appearing pulmonary nodules or masses are otherwise noted. There is a background of mild centrilobular and moderate paraseptal emphysema, most pronounced in the lung apices bilaterally.  Musculoskeletal: There are no aggressive appearing lytic or blastic lesions noted in the visualized portions of the skeleton.  CT ABDOMEN AND PELVIS FINDINGS   Abdomen/Pelvis: The appearance of the liver, gallbladder, pancreas, spleen, bilateral adrenal glands and right kidney is unremarkable. Sub cm low-attenuation lesion in the interpolar region of the left kidney is too small to definitively characterize, but is similar to prior examinations, favored to represent a tiny cyst.  Atherosclerosis throughout the abdominal and pelvic vasculature, without evidence of aneurysm or dissection. Normal appendix. No significant volume of ascites. No pneumoperitoneum. No pathologic distention of small bowel. Status post sigmoidectomy with Hartmann's pouch and left lower quadrant colostomy. Status post hysterectomy. Ovaries are not confidently identified may be surgically absent or atrophic. Urinary bladder is normal in appearance.  Musculoskeletal: There are no aggressive appearing lytic or blastic lesions noted in the visualized portions of the skeleton.  IMPRESSION: 1. Today's study demonstrates stable disease with unchanged size of right upper lobe pulmonary nodule and right hilar lymph node. No new sites of metastatic disease are otherwise noted in the chest, abdomen or pelvis. 2. Mild centrilobular and moderate paraseptal emphysema, most notably in the lung apices. 3. Atherosclerosis. 4. Additional incidental findings, as above, similar prior examinations.   Electronically Signed   By: Vinnie Langton M.D.   On: 05/08/2014 12:49   Mr [name] [place] Contrast  04/22/2014   CLINICAL DATA:  Metastatic lung cancer  EXAM: MRI HEAD WITHOUT AND WITH CONTRAST  TECHNIQUE: Multiplanar, multiecho pulse sequences of the brain and surrounding structures were obtained without and with intravenous contrast.  CONTRAST:  17m MULTIHANCE GADOBENATE DIMEGLUMINE 529 MG/ML IV SOLN  COMPARISON:  MRI head 01/17/2014  FINDINGS: Right parietal enhancing lesion is unchanged in size. This has central cystic change and peripheral enhancement. This lesion measures 8.1 x 7.5 mm. Mild surrounding white matter  edema has progressed . Continued close follow-up recommended.  No new metastatic deposits are identified.  Chronic microvascular ischemic change in the white matter. New area of infarction in the left internal capsule anteriorly appears chronic. Diffusion-weighted imaging is negative for acute infarct. Right parietal metastatic lesion does show restricted diffusion as noted previously.  Ventricle size is normal. Negative for hemorrhage. Paranasal sinuses are clear.  IMPRESSION: Right parietal metastatic deposit unchanged in size however shows increased surrounding white matter edema. Continued close followup is warranted. No new lesions.  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   On: 04/22/2014 14:06   Ct Abdomen Pelvis W Contrast  05/08/2014   CLINICAL DATA:  History of metastatic non-small-cell lung cancer. Restaging examination.  EXAM: CT CHEST, ABDOMEN, AND PELVIS WITH CONTRAST  TECHNIQUE: Multidetector CT imaging of the chest, abdomen and pelvis was performed following the standard protocol during bolus administration of intravenous contrast.  CONTRAST:  884mOMNIPAQUE IOHEXOL 300 MG/ML  SOLN  COMPARISON:  CT of the chest, abdomen and pelvis 03/03/2014.  FINDINGS:  CT CHEST FINDINGS  Mediastinum: Previously noted right hilar lymph node is unchanged measuring 1 cm short axis (image 28 of series 2). No other definite pathologically enlarged mediastinal or left hilar lymph nodes are noted. Heart size is normal. No significant pericardial fluid, thickening or pericardial calcification. Atherosclerotic calcifications in the thoracic aorta and great vessels of the mediastinum. Esophagus is unremarkable in appearance.  Lungs/Pleura: The previously described right upper lobe nodule is stable in size. On image 14 of series for when measured in a similar fashion on the prior study, the lesion is unchanged measuring 2.2 x 2.3 cm. This  continues have spiculated margins with retraction of the overlying pleura. No other new suspicious appearing pulmonary nodules or masses are otherwise noted. There is a background of mild centrilobular and moderate paraseptal emphysema, most pronounced in the lung apices bilaterally.  Musculoskeletal: There are no aggressive appearing lytic or blastic lesions noted in the visualized portions of the skeleton.  CT ABDOMEN AND PELVIS FINDINGS  Abdomen/Pelvis: The appearance of the liver, gallbladder, pancreas, spleen, bilateral adrenal glands and right kidney is unremarkable. Sub cm low-attenuation lesion in the interpolar region of the left kidney is too small to definitively characterize, but is similar to prior examinations, favored to represent a tiny cyst.  Atherosclerosis throughout the abdominal and pelvic vasculature, without evidence of aneurysm or dissection. Normal appendix. No significant volume of ascites. No pneumoperitoneum. No pathologic distention of small bowel. Status post sigmoidectomy with Hartmann's pouch and left lower quadrant colostomy. Status post hysterectomy. Ovaries are not confidently identified may be surgically absent or atrophic. Urinary bladder is normal in appearance.  Musculoskeletal: There are no aggressive appearing lytic or blastic lesions noted in the visualized portions of the skeleton.  IMPRESSION: 1. Today's study demonstrates stable disease with unchanged size of right upper lobe pulmonary nodule and right hilar lymph node. No new sites of metastatic disease are otherwise noted in the chest, abdomen or pelvis. 2. Mild centrilobular and moderate paraseptal emphysema, most notably in the lung apices. 3. Atherosclerosis. 4. Additional incidental findings, as above, similar prior examinations.   Electronically Signed   By: Vinnie Langton M.D.   On: 05/08/2014 12:49   ASSESSMENT AND PLAN: This is a very pleasant 63 years old white female recently diagnosed with a stage IV  non-small cell lung cancer, adenocarcinoma with negative EGFR mutation and negative ALK gene translocation. The patient is status post stereotactic radiotherapy to a solitary brain metastases in addition to palliative radiotherapy to the right upper lobe lung mass. The patient was recently treated with systemic chemotherapy with carboplatin and Alimta status post 6 cycles and tolerated her treatment fairly well. The patient was discussed with and also seen by Dr. Julien Nordmann. Her recent restaging CT scan revealed stable disease. We'll plan to proceed with maintenance chemotherapy with single agent Alimta at 500 mg per meter squared given every 3 weeks beginning 05/19/2014. Regarding the reversal of her colostomy, from our standpoint with stable disease this would be fine to proceed we can always give her a break from her maintenance chemotherapy to have this procedure done and to recover from it. She will need to he seen again by Dr. Zella Richer for him to determine whether or the reversal is still a viable option. She will followup in 4 weeks prior to the start of cycle #2 of her maintenance chemotherapy with single agent Alimta. She will receive her flu vaccine today as requested.   For depression, the patient will continue on Remeron  30 mg by mouth each bedtime. The patient was advised to call immediately if she has any concerning symptoms in the interval.  The patient voices understanding of current disease status and treatment options and is in agreement with the current care plan.  All questions were answered. The patient knows to call the clinic with any problems, questions or concerns. We can certainly see the patient much sooner if necessary.  Disclaimer: This note was dictated with voice recognition software. Similar sounding words can inadvertently be transcribed and may not be corrected upon review. Carlton Adam, PA-C 05/12/2014  ADDENDUM: Hematology/Oncology Attending: I had a face to  face encounter with the patient today. I recommended her care plan. This is a very pleasant 63 years old white female with metastatic non-small cell lung cancer, adenocarcinoma who completed 6 cycles of induction chemotherapy with carboplatin and Alimta. She tolerated her treatment fairly well with no significant adverse effects. The recent CT scan of the chest, abdomen and pelvis showed no evidence for disease progression. I discussed the scan results with the patient and her husband. I recommended for her to consider maintenance chemotherapy with single agent Alimta 500 mg/M2 every 3 weeks. I discussed with the patient adverse effect of this treatment and she would like to proceed with her treatment today as scheduled. Regarding the reversal of her colostomy I would defer this to Dr. Zella Richer and I would consider delaying her maintenance treatment for the time needed for this procedure. The patient would come back for followup visit in 3 weeks with the next cycle of her treatment. She was advised to call immediately if she has any concerning symptoms in the interval.  Disclaimer: This note was dictated with voice recognition software. Similar sounding words can inadvertently be transcribed and may be missed upon review. Eilleen Kempf., MD 05/12/2014

## 2014-05-14 ENCOUNTER — Other Ambulatory Visit: Payer: Self-pay | Admitting: *Deleted

## 2014-05-14 DIAGNOSIS — C7949 Secondary malignant neoplasm of other parts of nervous system: Principal | ICD-10-CM

## 2014-05-14 DIAGNOSIS — C7931 Secondary malignant neoplasm of brain: Secondary | ICD-10-CM

## 2014-05-19 ENCOUNTER — Ambulatory Visit (HOSPITAL_BASED_OUTPATIENT_CLINIC_OR_DEPARTMENT_OTHER): Payer: BC Managed Care – PPO

## 2014-05-19 ENCOUNTER — Other Ambulatory Visit (HOSPITAL_BASED_OUTPATIENT_CLINIC_OR_DEPARTMENT_OTHER): Payer: BC Managed Care – PPO

## 2014-05-19 VITALS — BP 120/60 | HR 63 | Temp 98.2°F | Resp 18

## 2014-05-19 DIAGNOSIS — Z5111 Encounter for antineoplastic chemotherapy: Secondary | ICD-10-CM

## 2014-05-19 DIAGNOSIS — C3410 Malignant neoplasm of upper lobe, unspecified bronchus or lung: Secondary | ICD-10-CM

## 2014-05-19 DIAGNOSIS — C7949 Secondary malignant neoplasm of other parts of nervous system: Secondary | ICD-10-CM

## 2014-05-19 DIAGNOSIS — C7931 Secondary malignant neoplasm of brain: Secondary | ICD-10-CM

## 2014-05-19 LAB — CBC WITH DIFFERENTIAL/PLATELET
BASO%: 0.6 % (ref 0.0–2.0)
Basophils Absolute: 0.1 10*3/uL (ref 0.0–0.1)
EOS%: 0 % (ref 0.0–7.0)
Eosinophils Absolute: 0 10*3/uL (ref 0.0–0.5)
HCT: 33.7 % — ABNORMAL LOW (ref 34.8–46.6)
HGB: 11 g/dL — ABNORMAL LOW (ref 11.6–15.9)
LYMPH%: 3.8 % — ABNORMAL LOW (ref 14.0–49.7)
MCH: 34.6 pg — ABNORMAL HIGH (ref 25.1–34.0)
MCHC: 32.7 g/dL (ref 31.5–36.0)
MCV: 105.7 fL — ABNORMAL HIGH (ref 79.5–101.0)
MONO#: 0.9 10*3/uL (ref 0.1–0.9)
MONO%: 9.2 % (ref 0.0–14.0)
NEUT#: 7.9 10*3/uL — ABNORMAL HIGH (ref 1.5–6.5)
NEUT%: 86.4 % — ABNORMAL HIGH (ref 38.4–76.8)
Platelets: 375 10*3/uL (ref 145–400)
RBC: 3.18 10*6/uL — ABNORMAL LOW (ref 3.70–5.45)
RDW: 15.9 % — ABNORMAL HIGH (ref 11.2–14.5)
WBC: 9.2 10*3/uL (ref 3.9–10.3)
lymph#: 0.4 10*3/uL — ABNORMAL LOW (ref 0.9–3.3)

## 2014-05-19 LAB — COMPREHENSIVE METABOLIC PANEL (CC13)
ALT: 7 U/L (ref 0–55)
AST: 10 U/L (ref 5–34)
Albumin: 3.7 g/dL (ref 3.5–5.0)
Alkaline Phosphatase: 69 U/L (ref 40–150)
Anion Gap: 11 mEq/L (ref 3–11)
BUN: 14.8 mg/dL (ref 7.0–26.0)
CO2: 25 mEq/L (ref 22–29)
Calcium: 9.6 mg/dL (ref 8.4–10.4)
Chloride: 106 mEq/L (ref 98–109)
Creatinine: 0.7 mg/dL (ref 0.6–1.1)
Glucose: 122 mg/dl (ref 70–140)
Potassium: 4.1 mEq/L (ref 3.5–5.1)
Sodium: 142 mEq/L (ref 136–145)
Total Bilirubin: 0.25 mg/dL (ref 0.20–1.20)
Total Protein: 7.1 g/dL (ref 6.4–8.3)

## 2014-05-19 MED ORDER — DEXAMETHASONE SODIUM PHOSPHATE 10 MG/ML IJ SOLN
INTRAMUSCULAR | Status: AC
  Filled 2014-05-19: qty 1

## 2014-05-19 MED ORDER — SODIUM CHLORIDE 0.9 % IV SOLN
500.0000 mg/m2 | Freq: Once | INTRAVENOUS | Status: AC
  Administered 2014-05-19: 800 mg via INTRAVENOUS
  Filled 2014-05-19: qty 32

## 2014-05-19 MED ORDER — DEXAMETHASONE SODIUM PHOSPHATE 10 MG/ML IJ SOLN
10.0000 mg | Freq: Once | INTRAMUSCULAR | Status: AC
  Administered 2014-05-19: 10 mg via INTRAVENOUS

## 2014-05-19 MED ORDER — ONDANSETRON 8 MG/NS 50 ML IVPB
INTRAVENOUS | Status: AC
  Filled 2014-05-19: qty 8

## 2014-05-19 MED ORDER — SODIUM CHLORIDE 0.9 % IV SOLN
Freq: Once | INTRAVENOUS | Status: AC
  Administered 2014-05-19: 11:00:00 via INTRAVENOUS

## 2014-05-19 MED ORDER — ONDANSETRON 8 MG/50ML IVPB (CHCC)
8.0000 mg | Freq: Once | INTRAVENOUS | Status: AC
  Administered 2014-05-19: 8 mg via INTRAVENOUS

## 2014-05-19 NOTE — Patient Instructions (Signed)
[facility] Discharge Instructions for Patients Receiving Chemotherapy  Today you received the following chemotherapy agents Alimta  To help prevent nausea and vomiting after your treatment, we encourage you to take your nausea medication   Zofran  As directed If you develop nausea and vomiting that is not controlled by your nausea medication, call the clinic.   BELOW ARE SYMPTOMS THAT SHOULD BE REPORTED IMMEDIATELY:  *FEVER GREATER THAN 100.5 F  *CHILLS WITH OR WITHOUT FEVER  NAUSEA AND VOMITING THAT IS NOT CONTROLLED WITH YOUR NAUSEA MEDICATION  *UNUSUAL SHORTNESS OF BREATH  *UNUSUAL BRUISING OR BLEEDING  TENDERNESS IN MOUTH AND THROAT WITH OR WITHOUT PRESENCE OF ULCERS  *URINARY PROBLEMS  *BOWEL PROBLEMS  UNUSUAL RASH Items with * indicate a potential emergency and should be followed up as soon as possible.  Feel free to call the clinic you have any questions or concerns. The clinic phone number is [phone].

## 2014-06-03 ENCOUNTER — Other Ambulatory Visit (INDEPENDENT_AMBULATORY_CARE_PROVIDER_SITE_OTHER): Payer: Self-pay | Admitting: General Surgery

## 2014-06-03 ENCOUNTER — Telehealth (INDEPENDENT_AMBULATORY_CARE_PROVIDER_SITE_OTHER): Payer: Self-pay

## 2014-06-03 DIAGNOSIS — K5732 Diverticulitis of large intestine without perforation or abscess without bleeding: Secondary | ICD-10-CM

## 2014-06-03 NOTE — Telephone Encounter (Signed)
Notified pt she will be scheduled for a barium enema (colostomy on 10/5) and a follow up 30 minute appt with Dr. Zella Richer to discuss surgery.  Will contact her with this information.

## 2014-06-03 NOTE — Telephone Encounter (Signed)
Noted and agree. 

## 2014-06-06 ENCOUNTER — Ambulatory Visit
Admission: RE | Admit: 2014-06-06 | Discharge: 2014-06-06 | Disposition: A | Discharge status: Home / Self Care | Payer: BC Managed Care – PPO | Source: Ambulatory Visit | Attending: General Surgery | Admitting: General Surgery

## 2014-06-06 DIAGNOSIS — K5732 Diverticulitis of large intestine without perforation or abscess without bleeding: Secondary | ICD-10-CM

## 2014-06-09 ENCOUNTER — Encounter: Payer: Self-pay | Admitting: Internal Medicine

## 2014-06-09 ENCOUNTER — Telehealth: Payer: Self-pay | Admitting: Internal Medicine

## 2014-06-09 ENCOUNTER — Ambulatory Visit: Payer: BC Managed Care – PPO | Admitting: Nutrition

## 2014-06-09 ENCOUNTER — Ambulatory Visit (HOSPITAL_BASED_OUTPATIENT_CLINIC_OR_DEPARTMENT_OTHER): Payer: BC Managed Care – PPO | Admitting: Internal Medicine

## 2014-06-09 ENCOUNTER — Ambulatory Visit (HOSPITAL_BASED_OUTPATIENT_CLINIC_OR_DEPARTMENT_OTHER): Payer: BC Managed Care – PPO

## 2014-06-09 ENCOUNTER — Other Ambulatory Visit (HOSPITAL_BASED_OUTPATIENT_CLINIC_OR_DEPARTMENT_OTHER): Payer: BC Managed Care – PPO

## 2014-06-09 VITALS — BP 115/51 | HR 59 | Temp 98.3°F | Resp 18 | Ht 64.0 in | Wt 125.4 lb

## 2014-06-09 DIAGNOSIS — C3410 Malignant neoplasm of upper lobe, unspecified bronchus or lung: Secondary | ICD-10-CM

## 2014-06-09 DIAGNOSIS — C3411 Malignant neoplasm of upper lobe, right bronchus or lung: Secondary | ICD-10-CM

## 2014-06-09 DIAGNOSIS — C7931 Secondary malignant neoplasm of brain: Secondary | ICD-10-CM

## 2014-06-09 DIAGNOSIS — Z5111 Encounter for antineoplastic chemotherapy: Secondary | ICD-10-CM

## 2014-06-09 LAB — CBC WITH DIFFERENTIAL/PLATELET
BASO%: 0.2 % (ref 0.0–2.0)
Basophils Absolute: 0 10*3/uL (ref 0.0–0.1)
EOS%: 0 % (ref 0.0–7.0)
Eosinophils Absolute: 0 10*3/uL (ref 0.0–0.5)
HCT: 32.8 % — ABNORMAL LOW (ref 34.8–46.6)
HGB: 10.8 g/dL — ABNORMAL LOW (ref 11.6–15.9)
LYMPH%: 3.3 % — ABNORMAL LOW (ref 14.0–49.7)
MCH: 33.9 pg (ref 25.1–34.0)
MCHC: 32.9 g/dL (ref 31.5–36.0)
MCV: 103 fL — ABNORMAL HIGH (ref 79.5–101.0)
MONO#: 0.4 10*3/uL (ref 0.1–0.9)
MONO%: 4.7 % (ref 0.0–14.0)
NEUT#: 7.3 10*3/uL — ABNORMAL HIGH (ref 1.5–6.5)
NEUT%: 91.8 % — ABNORMAL HIGH (ref 38.4–76.8)
Platelets: 381 10*3/uL (ref 145–400)
RBC: 3.18 10*6/uL — ABNORMAL LOW (ref 3.70–5.45)
RDW: 14.3 % (ref 11.2–14.5)
WBC: 8 10*3/uL (ref 3.9–10.3)
lymph#: 0.3 10*3/uL — ABNORMAL LOW (ref 0.9–3.3)

## 2014-06-09 LAB — COMPREHENSIVE METABOLIC PANEL (CC13)
ALT: 9 U/L (ref 0–55)
AST: 11 U/L (ref 5–34)
Albumin: 3.5 g/dL (ref 3.5–5.0)
Alkaline Phosphatase: 71 U/L (ref 40–150)
Anion Gap: 11 mEq/L (ref 3–11)
BUN: 10.5 mg/dL (ref 7.0–26.0)
CO2: 26 mEq/L (ref 22–29)
Calcium: 9.6 mg/dL (ref 8.4–10.4)
Chloride: 105 mEq/L (ref 98–109)
Creatinine: 0.7 mg/dL (ref 0.6–1.1)
Glucose: 124 mg/dl (ref 70–140)
Potassium: 3.7 mEq/L (ref 3.5–5.1)
Sodium: 142 mEq/L (ref 136–145)
Total Bilirubin: 0.2 mg/dL (ref 0.20–1.20)
Total Protein: 6.9 g/dL (ref 6.4–8.3)

## 2014-06-09 MED ORDER — DEXAMETHASONE SODIUM PHOSPHATE 10 MG/ML IJ SOLN
INTRAMUSCULAR | Status: AC
  Filled 2014-06-09: qty 1

## 2014-06-09 MED ORDER — SODIUM CHLORIDE 0.9 % IV SOLN
500.0000 mg/m2 | Freq: Once | INTRAVENOUS | Status: AC
  Administered 2014-06-09: 800 mg via INTRAVENOUS
  Filled 2014-06-09: qty 32

## 2014-06-09 MED ORDER — ONDANSETRON 8 MG/NS 50 ML IVPB
INTRAVENOUS | Status: AC
  Filled 2014-06-09: qty 8

## 2014-06-09 MED ORDER — DEXAMETHASONE SODIUM PHOSPHATE 10 MG/ML IJ SOLN
10.0000 mg | Freq: Once | INTRAMUSCULAR | Status: AC
  Administered 2014-06-09: 10 mg via INTRAVENOUS

## 2014-06-09 MED ORDER — ONDANSETRON 8 MG/50ML IVPB (CHCC)
8.0000 mg | Freq: Once | INTRAVENOUS | Status: AC
  Administered 2014-06-09: 8 mg via INTRAVENOUS

## 2014-06-09 MED ORDER — SODIUM CHLORIDE 0.9 % IV SOLN
Freq: Once | INTRAVENOUS | Status: AC
  Administered 2014-06-09: 12:00:00 via INTRAVENOUS

## 2014-06-09 NOTE — Patient Instructions (Signed)
[facility] Discharge Instructions for Patients Receiving Chemotherapy  Today you received the following chemotherapy agents: Alimta.  To help prevent nausea and vomiting after your treatment, we encourage you to take your nausea medication: Compazine 10 mg every 6 hours as needed and Zofran 8mg  every 8 hours as needed.   If you develop nausea and vomiting that is not controlled by your nausea medication, call the clinic.   BELOW ARE SYMPTOMS THAT SHOULD BE REPORTED IMMEDIATELY:  *FEVER GREATER THAN 100.5 F  *CHILLS WITH OR WITHOUT FEVER  NAUSEA AND VOMITING THAT IS NOT CONTROLLED WITH YOUR NAUSEA MEDICATION  *UNUSUAL SHORTNESS OF BREATH  *UNUSUAL BRUISING OR BLEEDING  TENDERNESS IN MOUTH AND THROAT WITH OR WITHOUT PRESENCE OF ULCERS  *URINARY PROBLEMS  *BOWEL PROBLEMS  UNUSUAL RASH Items with * indicate a potential emergency and should be followed up as soon as possible.  Feel free to call the clinic you have any questions or concerns. The clinic phone number is [phone].

## 2014-06-09 NOTE — Progress Notes (Signed)
Patient has requested education on high fiber diet secondary to diagnosis of diverticulosis.  Patient is receiving maintenance chemotherapy.  She denies nutrition side effects related to chemotherapy.  Weight increased and documented as 125 pounds up from 119 pounds July 22.  Nutrition diagnosis: Food and nutrition related knowledge deficit related to diagnosis of diverticulosis, as evidenced by no prior need for nutrition related information.  Intervention: Patient educated on strategies for increasing fiber.   Provided patient with fact sheet on high fiber diet.   Questions were answered and teach back method used.  Nutrition diagnosis resolved.  Patient to contact me for questions or concerns.  She has my contact information.   **Disclaimer: This note was dictated with voice recognition software. Similar sounding words can inadvertently be transcribed and this note may contain transcription errors which may not have been corrected upon publication of note.**

## 2014-06-09 NOTE — Telephone Encounter (Signed)
[facility] APPT SCHEDULE FOR NOV.

## 2014-06-09 NOTE — Progress Notes (Signed)
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  PRIOR THERAPY:  1) Status post stereotactic radiotherapy to a solitary right parietal brain lesion under the care of Dr. [name] on 10/16/2013. 2) Status post palliative radiotherapy to the right lung tumor under the care of Dr. [name] completed on 12/05/2013. 3) Systemic chemotherapy with carboplatin for AUC of 5 and Alimta 500 mg/M2 every 3 weeks. First dose Jan 06 2014. Status post 6 cycles.  CURRENT THERAPY: Systemic chemotherapy with maintenance Alimta 500 mg/M2 every 3 weeks. Status post 1 cycle.  INTERVAL HISTORY: Alexis [name] 63 y.o. female returns to the clinic today for followup visit accompanied by her husband. The patient tolerated the first cycle of her maintenance chemotherapy with Alimta fairly well. She denied having any significant chest pain, shortness of breath, cough or hemoptysis. She has no fever or chills, no nausea or vomiting. She denied having any significant weight loss or night sweats. She is here today to start cycle #2 of her chemotherapy. She was seen recently by Dr. Zella Richer and she is currently undergoing evaluation for colon reanastomosis.  MEDICAL HISTORY: Past Medical History  Diagnosis Date  . Cancer     lung, hx cervicle  . Ileus, postoperative 11/18/2013  . Physical deconditioning 11/18/2013  . Severe protein-calorie malnutrition 11/18/2013  . Anemia 11/18/2013  . History of radiation therapy 10-28-13- 12-05-13    lung ca 50  Gy/26f    ALLERGIES:  is allergic to codeine.  MEDICATIONS:  Current Outpatient Prescriptions  Medication Sig Dispense Refill  . dexamethasone (DECADRON) 4 MG tablet 4 mg by mouth twice a day the day before, day of and day after the chemotherapy every 3 weeks  40 tablet  1  . folic acid (FOLVITE) 1 MG tablet Take 1 tablet (1 mg total) by mouth daily.  30 tablet  4  . ibuprofen (ADVIL,MOTRIN) 200 MG tablet You can take 2-3 tablets every 6 hours as needed for pain.  30 tablet  0  . mirtazapine (REMERON) 30 MG tablet Take 1 tablet (30 mg total) by mouth at bedtime.  30 tablet  2  . Multiple Vitamin (MULTIVITAMIN WITH MINERALS) TABS tablet Take 1 daily with lunch and a meal.  Make sure it has iron for women.      . ondansetron (ZOFRAN) 8 MG tablet Take 1 tablet (8 mg total) by mouth every 8 (eight) hours as needed for nausea or vomiting.  30 tablet  0  . potassium chloride SA (K-DUR,KLOR-CON) 20 MEQ tablet Take 1 tablet (20 mEq total) by mouth daily.  7 tablet  0  . prochlorperazine (COMPAZINE) 10 MG tablet Take 1 tablet (10 mg total) by mouth every 6 (six) hours as needed for nausea or vomiting.  60 tablet  0   No current facility-administered medications for this visit.    SURGICAL HISTORY:  Past Surgical History  Procedure Laterality Date  . Video bronchoscopy Bilateral 08/30/2013    Procedure: VIDEO BRONCHOSCOPY WITH FLUORO;  Surgeon: MTanda Rockers MD;  Location: [facility];  Service: Cardiopulmonary;  Laterality: Bilateral;  . Abdominal hysterectomy    . Laparotomy N/A 11/03/2013    Procedure: EXPLORATORY LAPAROTOMY, DRAINAGE OF INTRA  ABDOMINAL ABSCESSES, MOBILIZATION OF SPLENIC FLEXURE, SIGMOID COLECTOMY WITH COLOSTOMY;  Surgeon: Odis Hollingshead, MD;  Location: [facility];  Service: General;  Laterality: N/A;    REVIEW OF SYSTEMS:  Constitutional: positive for fatigue Eyes: negative Ears, nose, mouth, throat, and face: negative Respiratory: negative Cardiovascular:  negative Gastrointestinal: negative Genitourinary:negative Integument/breast: negative Hematologic/lymphatic: negative Musculoskeletal:negative Neurological: positive for weakness Behavioral/Psych: positive for depression Endocrine: negative Allergic/Immunologic: negative   PHYSICAL EXAMINATION: General appearance: alert, cooperative, fatigued and no distress Head: Normocephalic, without obvious abnormality, atraumatic Neck: no adenopathy, no JVD, supple, symmetrical, trachea midline and thyroid not enlarged, symmetric, no tenderness/mass/nodules Lymph nodes: Cervical, supraclavicular, and axillary nodes normal. Resp: clear to auscultation bilaterally Back: symmetric, no curvature. ROM normal. No CVA tenderness. Cardio: regular rate and rhythm, S1, S2 normal, no murmur, click, rub or gallop GI: soft, non-tender; bowel sounds normal; no masses,  no organomegaly Extremities: extremities normal, atraumatic, no cyanosis or edema Neurologic: Alert and oriented X 3, normal strength and tone. Normal symmetric reflexes. Normal coordination and gait  ECOG PERFORMANCE STATUS: 2 - Symptomatic, <50% confined to bed  Blood pressure 115/51, pulse 59, temperature 98.3 F (36.8 C), temperature source Oral, resp. rate 18, height _0  (1.626 m), weight 125 lb 6.4 oz (56.881 kg).  LABORATORY DATA: Lab Results  Component Value Date   WBC 8.0 06/09/2014   HGB 10.8* 06/09/2014   HCT 32.8* 06/09/2014   MCV 103.0* 06/09/2014   PLT 381 06/09/2014      Chemistry      Component Value Date/Time   NA 142 06/09/2014 1000   NA 138 04/07/2014 1037   K 3.7 06/09/2014 1000   K 3.8 04/07/2014 1037   CL 102 04/07/2014 1037   CO2 26 06/09/2014 1000   CO2 28 04/07/2014 1037   BUN 10.5 06/09/2014 1000   BUN 15 04/07/2014 1037   CREATININE 0.7 06/09/2014 1000   CREATININE 0.52 04/07/2014 1037      Component Value Date/Time   CALCIUM 9.6 06/09/2014 1000   CALCIUM 9.2 04/07/2014 1037   ALKPHOS 71 06/09/2014  1000   ALKPHOS 62 04/07/2014 1037   AST 11 06/09/2014 1000   AST 11 04/07/2014 1037   ALT 9 06/09/2014 1000   ALT 12 04/07/2014 1037   BILITOT 0.20 06/09/2014 1000   BILITOT 0.6 04/07/2014 1037       RADIOGRAPHIC STUDIES: No results found.  ASSESSMENT AND PLAN: This is a very pleasant 63 years old white female recently diagnosed with a stage IV non-small cell lung cancer, adenocarcinoma with negative EGFR mutation and negative ALK gene translocation. The patient is status post stereotactic radiotherapy to a solitary brain metastases in addition to palliative radiotherapy to the right upper lobe lung mass. She status post 6 cycles of systemic chemotherapy with carboplatin and Alimta with improvement in her disease and she is currently undergoing maintenance chemotherapy with single agent Alimta status post 1 cycle and tolerating her treatment fairly well. We will proceed with cycle #2 today as scheduled. The patient would come back for followup visit in 3 weeks for evaluation before starting cycle #3. For history of depression, the patient on Remeron 30 mg by mouth each bedtime. The patient was advised to call immediately if she has any concerning symptoms in the interval. The patient voices understanding of current disease  status and treatment options and is in agreement with the current care plan.  All questions were answered. The patient knows to call the clinic with any problems, questions or concerns. We can certainly see the patient much sooner if necessary.  Disclaimer: This note was dictated with voice recognition software. Similar sounding words can inadvertently be transcribed and may not be corrected upon review.

## 2014-06-12 ENCOUNTER — Encounter (INDEPENDENT_AMBULATORY_CARE_PROVIDER_SITE_OTHER): Payer: Self-pay | Admitting: General Surgery

## 2014-06-12 NOTE — Progress Notes (Signed)
Patient ID: Alexis [name], female   DOB: 1951-06-22, 63 y.o.   MRN: [id number]  Katesha Eichel. Kawecki 06/12/2014 10:58 AM Location: [facility] Patient #: [id number] DOB: 1951/04/07 Married / Language: English / Race: White Female History of Present Illness Odis Hollingshead MD; 06/12/2014 5:57 PM) The patient is a 63 year old female who presents for a pre-op visit.  Note:She had a colonoscopy through the colostomy and rectum. No masses were noted. Barium enema demonstrated some scattered diverticular disease. The proximal end of the rectal stump is at the mid sacral area. She is here to discuss colostomy closure.  Allergies Ventura Sellers, [place]; 06/12/2014 11:00 AM) Codeine/Codeine Derivatives  Medication History Ventura Sellers, [place]; 06/12/2014 11:02 AM) Decadron (4MG  Tablet, Oral) Active. Folvite (5MG /ML Solution, Injection) Active. Ibuprofen (200MG  Capsule, Oral) Active. Remeron SolTab (30MG  Tablet Disperse, Oral) Active. Remeron (30MG  Tablet, Oral) Active. Multi Vitamin Daily (Oral) Active. Zofran ODT (8MG  Tablet Disperse, Oral prn) Active. Potassium Chloride (20 MEQ/15ML(10%) Liquid, Oral) Active. Compazine (10MG  Tablet, Oral) Active. Medications Reconciled    Vitals Sharyn Lull R. Brooks CMA; 06/12/2014 10:59 AM) 06/12/2014 10:59 AM Weight: 127.13 lb Height: 64in Body Surface Area: 1.61 m Body Mass Index: 21.82 kg/m Temp.: 97.73F(Temporal)  Pulse: 78 (Regular)  Resp.: 20 (Unlabored)  BP: 110/70 (Sitting, Left Arm, Standard)     Physical Exam Odis Hollingshead MD; 06/12/2014 5:59 PM)  The physical exam findings are as follows: Note:General: Thin female in NAD. Pleasant and cooperative. Her husband is present.  HEENT: [place]/AT, no facial masses  EYES: EOMI, no icterus  CV: RRR, no murmur, no JVD.  CHEST: Breath sounds equal and clear. Respirations nonlabored.  ABDOMEN: Soft, nontender, nondistended, midline scar, left  sided colostomy  NEUROLOGIC: Alert and oriented, answers questions appropriately, normal gait and station.  PSYCHIATRIC: Normal mood, affect , and behavior.    Assessment & Plan Odis Hollingshead MD; 06/12/2014 6:01 PM)  COLOSTOMY IN PLACE (V44.3  Z93.3) Impression: Metastatic lung cancer appears to be in check. Colonoscopy and BE reviewed. She wants to proceed with colostomy closure.  Plan: Laparoscopic assisted colostomy closure. I have explained the procedure and risks of colostomy closure. Risks include but are not limited to bleeding, infection, wound problems, anesthesia, anastomotic leak, need for colostomy, need for reoperative surgery, injury to intraabominal organs (such as intestine, spleen, kidney, bladder, ureter, etc.), ileus, irregular bowel habits. She seems to understand and wants to proceed.  Jackolyn Confer, M.D.

## 2014-06-25 ENCOUNTER — Telehealth: Payer: Self-pay | Admitting: Internal Medicine

## 2014-06-25 ENCOUNTER — Other Ambulatory Visit: Payer: Self-pay | Admitting: Internal Medicine

## 2014-06-25 ENCOUNTER — Telehealth: Payer: Self-pay | Admitting: Radiation Therapy

## 2014-06-25 NOTE — Telephone Encounter (Signed)
Mrs. Langbehn left me a message to inform us that her colostomy closure surgery is scheduled for 11/12. She wants to know if her chemo scheduled for 11/9 needs to be cancelled. She remembers something being mentioned about not having chemo that close to surgery. I returned the call and spoke to her husband. I let him know that I would be reaching out to [facility], the thoracic navigator who works closely with Dr. Julien Nordmann to get this question answered and her chemo cancelled/rescheduled. He was thankful for the return call and will await direction from [place].   This message will be routed via Epic message to Norton Blizzard, Thoracic Navigator and the patient's medical oncologist, Dr. Julien Nordmann.   Mont Dutton  Radiation Therapist, Special Procedures Navigator

## 2014-06-25 NOTE — Telephone Encounter (Signed)
s.w pt and advised on NOV appt....pt ok adn aware °

## 2014-06-26 NOTE — Progress Notes (Signed)
Please put orders in Epic surgery 07-10-14 pre op 07-01-14 Thanks

## 2014-06-27 ENCOUNTER — Encounter (HOSPITAL_COMMUNITY): Payer: Self-pay | Admitting: Pharmacy Technician

## 2014-06-30 NOTE — Progress Notes (Signed)
CHEST CT WITH CONTRAST 05-08-14 EPIC

## 2014-06-30 NOTE — Patient Instructions (Addendum)
[facility]  06/30/2014   Your procedure is scheduled on: Thursday November 12th, 2015  Report to [facility]  Entrance and follow signs to  [place] at 930 AM.  Call this number if you have problems the morning of surgery [phone]   Remember:FOLLOW ANY BOWEL PREP INSTRUCTIONS WITH CLEAR LIQUIDS FROM DR Zella Richer INSTRUCTIONS  Do not eat food or drink liquids :After Midnight.     Take these medicines the morning of surgery with A SIP OF WATER: Zofran(ondansetron);Compazine if needed                               You may not have any metal on your body including hair pins and piercings  Do not wear jewelry, make-up, lotions, powders, or deodorant.    Do not bring valuables to the hospital. [place].  Contacts, dentures or bridgework may not be worn into surgery.  Leave suitcase in the car. After surgery it may be brought to your room.  For patients admitted to the hospital, checkout time is 11:00 AM the day of discharge.   ________________________________________________________________________  [facility] - Preparing for Surgery Before surgery, you can play an important role.  Because skin is not sterile, your skin needs to be as free of germs as possible.  You can reduce the number of germs on your skin by washing with CHG (chlorahexidine gluconate) soap before surgery.  CHG is an antiseptic cleaner which kills germs and bonds with the skin to continue killing germs even after washing. Please DO NOT use if you have an allergy to CHG or antibacterial soaps.  If your skin becomes reddened/irritated stop using the CHG and inform your nurse when you arrive at Short Stay. Do not shave (including legs and underarms) for at least 48 hours prior to the first CHG shower.  You may shave your face/neck. Please follow these instructions carefully:  1.  Shower with CHG Soap the night before surgery and the  morning of  Surgery.  2.  If you choose to wash your hair, wash your hair first as usual with your  normal  shampoo.  3.  After you shampoo, rinse your hair and body thoroughly to remove the  shampoo.                           4.  Use CHG as you would any other liquid soap.  You can apply chg directly  to the skin and wash                       Gently with a scrungie or clean washcloth.  5.  Apply the CHG Soap to your body ONLY FROM THE NECK DOWN.   Do not use on face/ open                           Wound or open sores. Avoid contact with eyes, ears mouth and genitals (private parts).                       Wash face,  Genitals (private parts) with your normal soap.             6.  Wash thoroughly, paying special attention to the area where  your surgery  will be performed.  7.  Thoroughly rinse your body with warm water from the neck down.  8.  DO NOT shower/wash with your normal soap after using and rinsing off  the CHG Soap.                9.  Pat yourself dry with a clean towel.            10.  Wear clean pajamas.            11.  Place clean sheets on your bed the night of your first shower and do not  sleep with pets. Day of Surgery : Do not apply any lotions/deodorants the morning of surgery.  Please wear clean clothes to the hospital/surgery center.  FAILURE TO FOLLOW THESE INSTRUCTIONS MAY RESULT IN THE CANCELLATION OF YOUR SURGERY PATIENT SIGNATURE_________________________________  NURSE SIGNATURE__________________________________  ________________________________________________________________________

## 2014-06-30 NOTE — Progress Notes (Signed)
Need orders asap in epic for 07-10-14 surgery, pre op is 07-03-14 thanks

## 2014-07-01 ENCOUNTER — Encounter (HOSPITAL_COMMUNITY)
Admission: RE | Admit: 2014-07-01 | Discharge: 2014-07-01 | Disposition: A | Discharge status: Home / Self Care | Payer: BC Managed Care – PPO | Source: Ambulatory Visit | Attending: General Surgery | Admitting: General Surgery

## 2014-07-01 ENCOUNTER — Other Ambulatory Visit: Payer: Self-pay

## 2014-07-01 ENCOUNTER — Encounter (HOSPITAL_COMMUNITY): Payer: Self-pay

## 2014-07-01 DIAGNOSIS — C3490 Malignant neoplasm of unspecified part of unspecified bronchus or lung: Secondary | ICD-10-CM | POA: Insufficient documentation

## 2014-07-01 DIAGNOSIS — Z01818 Encounter for other preprocedural examination: Secondary | ICD-10-CM | POA: Diagnosis not present

## 2014-07-01 HISTORY — DX: Anxiety disorder, unspecified: F41.9

## 2014-07-01 LAB — BASIC METABOLIC PANEL
Anion gap: 13 (ref 5–15)
BUN: 11 mg/dL (ref 6–23)
CO2: 28 mEq/L (ref 19–32)
Calcium: 9.9 mg/dL (ref 8.4–10.5)
Chloride: 101 mEq/L (ref 96–112)
Creatinine, Ser: 0.66 mg/dL (ref 0.50–1.10)
GFR calc Af Amer: >90 mL/min (ref >90)
GFR calc non Af Amer: >90 mL/min (ref >90)
Glucose, Bld: 101 mg/dL — ABNORMAL HIGH (ref 70–99)
Potassium: 3.8 mEq/L (ref 3.7–5.3)
Sodium: 142 mEq/L (ref 137–147)

## 2014-07-01 LAB — CBC
HCT: 36 % (ref 36.0–46.0)
Hemoglobin: 11.5 g/dL — ABNORMAL LOW (ref 12.0–15.0)
MCH: 33 pg (ref 26.0–34.0)
MCHC: 31.9 g/dL (ref 30.0–36.0)
MCV: 103.2 fL — ABNORMAL HIGH (ref 78.0–100.0)
Platelets: 399 10*3/uL (ref 150–400)
RBC: 3.49 MIL/uL — ABNORMAL LOW (ref 3.87–5.11)
RDW: 13.9 % (ref 11.5–15.5)
WBC: 4.5 10*3/uL (ref 4.0–10.5)

## 2014-07-01 NOTE — Progress Notes (Signed)
CBC results per PAT visit 07/01/2014 in epic sent to Dr Zella Richer

## 2014-07-02 ENCOUNTER — Other Ambulatory Visit (INDEPENDENT_AMBULATORY_CARE_PROVIDER_SITE_OTHER): Payer: Self-pay | Admitting: General Surgery

## 2014-07-02 NOTE — Progress Notes (Signed)
Made dr Delma Post anesthesia aware of 07-01-14 ekg results, anesthesia will evalute patient morning of surgery per dr Delma Post.

## 2014-07-07 ENCOUNTER — Ambulatory Visit: Payer: BC Managed Care – PPO | Admitting: Physician Assistant

## 2014-07-07 ENCOUNTER — Other Ambulatory Visit: Payer: BC Managed Care – PPO

## 2014-07-07 ENCOUNTER — Ambulatory Visit: Payer: BC Managed Care – PPO

## 2014-07-10 ENCOUNTER — Inpatient Hospital Stay (HOSPITAL_COMMUNITY)
Admission: RE | Admit: 2014-07-10 | Discharge: 2014-07-16 | DRG: 336 | Disposition: A | Discharge status: Home / Self Care | Payer: BC Managed Care – PPO | Source: Ambulatory Visit | Attending: General Surgery | Admitting: General Surgery

## 2014-07-10 ENCOUNTER — Encounter (HOSPITAL_COMMUNITY): Payer: Self-pay

## 2014-07-10 ENCOUNTER — Encounter (HOSPITAL_COMMUNITY)
Admission: RE | Disposition: A | Discharge status: Home / Self Care | Payer: Self-pay | Source: Ambulatory Visit | Attending: General Surgery

## 2014-07-10 ENCOUNTER — Inpatient Hospital Stay (HOSPITAL_COMMUNITY): Payer: BC Managed Care – PPO | Admitting: Anesthesiology

## 2014-07-10 DIAGNOSIS — Z933 Colostomy status: Secondary | ICD-10-CM

## 2014-07-10 DIAGNOSIS — K660 Peritoneal adhesions (postprocedural) (postinfection): Secondary | ICD-10-CM | POA: Diagnosis present

## 2014-07-10 DIAGNOSIS — Z87891 Personal history of nicotine dependence: Secondary | ICD-10-CM

## 2014-07-10 DIAGNOSIS — C3490 Malignant neoplasm of unspecified part of unspecified bronchus or lung: Secondary | ICD-10-CM | POA: Diagnosis present

## 2014-07-10 DIAGNOSIS — Z01812 Encounter for preprocedural laboratory examination: Secondary | ICD-10-CM

## 2014-07-10 DIAGNOSIS — Z9049 Acquired absence of other specified parts of digestive tract: Secondary | ICD-10-CM | POA: Diagnosis present

## 2014-07-10 DIAGNOSIS — D62 Acute posthemorrhagic anemia: Secondary | ICD-10-CM | POA: Diagnosis present

## 2014-07-10 DIAGNOSIS — J449 Chronic obstructive pulmonary disease, unspecified: Secondary | ICD-10-CM | POA: Diagnosis present

## 2014-07-10 DIAGNOSIS — Z8719 Personal history of other diseases of the digestive system: Secondary | ICD-10-CM | POA: Diagnosis not present

## 2014-07-10 DIAGNOSIS — Z433 Encounter for attention to colostomy: Secondary | ICD-10-CM | POA: Diagnosis present

## 2014-07-10 DIAGNOSIS — C3411 Malignant neoplasm of upper lobe, right bronchus or lung: Secondary | ICD-10-CM | POA: Diagnosis present

## 2014-07-10 DIAGNOSIS — C7931 Secondary malignant neoplasm of brain: Secondary | ICD-10-CM | POA: Diagnosis present

## 2014-07-10 DIAGNOSIS — K567 Ileus, unspecified: Secondary | ICD-10-CM | POA: Diagnosis not present

## 2014-07-10 DIAGNOSIS — Z79899 Other long term (current) drug therapy: Secondary | ICD-10-CM

## 2014-07-10 HISTORY — DX: Colostomy status: Z93.3

## 2014-07-10 HISTORY — PX: COLOSTOMY TAKEDOWN: SHX5258

## 2014-07-10 LAB — COMPREHENSIVE METABOLIC PANEL
ALT: 8 U/L (ref 0–35)
AST: 15 U/L (ref 0–37)
Albumin: 4.1 g/dL (ref 3.5–5.2)
Alkaline Phosphatase: 91 U/L (ref 39–117)
Anion gap: 15 (ref 5–15)
BUN: 9 mg/dL (ref 6–23)
CO2: 23 mEq/L (ref 19–32)
Calcium: 10 mg/dL (ref 8.4–10.5)
Chloride: 101 mEq/L (ref 96–112)
Creatinine, Ser: 0.71 mg/dL (ref 0.50–1.10)
GFR calc Af Amer: >90 mL/min (ref >90)
GFR calc non Af Amer: >90 mL/min (ref >90)
Glucose, Bld: 115 mg/dL — ABNORMAL HIGH (ref 70–99)
Potassium: 3.5 mEq/L — ABNORMAL LOW (ref 3.7–5.3)
Sodium: 139 mEq/L (ref 137–147)
Total Bilirubin: 0.4 mg/dL (ref 0.3–1.2)
Total Protein: 7.8 g/dL (ref 6.0–8.3)

## 2014-07-10 LAB — CBC WITH DIFFERENTIAL/PLATELET
Basophils Absolute: 0 10*3/uL (ref 0.0–0.1)
Basophils Relative: 0 % (ref 0–1)
Eosinophils Absolute: 0.1 10*3/uL (ref 0.0–0.7)
Eosinophils Relative: 1 % (ref 0–5)
HCT: 38.9 % (ref 36.0–46.0)
Hemoglobin: 12.7 g/dL (ref 12.0–15.0)
Lymphocytes Relative: 13 % (ref 12–46)
Lymphs Abs: 0.8 10*3/uL (ref 0.7–4.0)
MCH: 33 pg (ref 26.0–34.0)
MCHC: 32.6 g/dL (ref 30.0–36.0)
MCV: 101 fL — ABNORMAL HIGH (ref 78.0–100.0)
Monocytes Absolute: 1.2 10*3/uL — ABNORMAL HIGH (ref 0.1–1.0)
Monocytes Relative: 19 % — ABNORMAL HIGH (ref 3–12)
Neutro Abs: 4.1 10*3/uL (ref 1.7–7.7)
Neutrophils Relative %: 67 % (ref 43–77)
Platelets: 276 10*3/uL (ref 150–400)
RBC: 3.85 MIL/uL — ABNORMAL LOW (ref 3.87–5.11)
RDW: 13.6 % (ref 11.5–15.5)
WBC: 6.1 10*3/uL (ref 4.0–10.5)

## 2014-07-10 LAB — TYPE AND SCREEN
ABO/RH(D): A POS
Antibody Screen: NEGATIVE

## 2014-07-10 LAB — PROTIME-INR
INR: 1.02 (ref 0.00–1.49)
Prothrombin Time: 13.5 seconds (ref 11.6–15.2)

## 2014-07-10 SURGERY — CLOSURE, COLOSTOMY, LAPAROSCOPIC
Anesthesia: General

## 2014-07-10 MED ORDER — LABETALOL HCL 5 MG/ML IV SOLN
INTRAVENOUS | Status: DC | PRN
  Administered 2014-07-10 (×2): 5 mg via INTRAVENOUS

## 2014-07-10 MED ORDER — BUPIVACAINE-EPINEPHRINE (PF) 0.5% -1:200000 IJ SOLN
INTRAMUSCULAR | Status: AC
  Filled 2014-07-10: qty 30

## 2014-07-10 MED ORDER — NEOSTIGMINE METHYLSULFATE 10 MG/10ML IV SOLN
INTRAVENOUS | Status: DC | PRN
  Administered 2014-07-10: 4 mg via INTRAVENOUS

## 2014-07-10 MED ORDER — LABETALOL HCL 5 MG/ML IV SOLN
INTRAVENOUS | Status: AC
  Filled 2014-07-10: qty 4

## 2014-07-10 MED ORDER — PROMETHAZINE HCL 25 MG/ML IJ SOLN
6.2500 mg | INTRAMUSCULAR | Status: DC | PRN
  Administered 2014-07-10: 6.25 mg via INTRAVENOUS

## 2014-07-10 MED ORDER — PROPOFOL 10 MG/ML IV BOLUS
INTRAVENOUS | Status: AC
  Filled 2014-07-10: qty 20

## 2014-07-10 MED ORDER — MEPERIDINE HCL 50 MG/ML IJ SOLN: 6.2500 mg | INTRAMUSCULAR | Status: DC | PRN

## 2014-07-10 MED ORDER — KCL-LACTATED RINGERS-D5W 20 MEQ/L IV SOLN
INTRAVENOUS | Status: DC
  Administered 2014-07-10 – 2014-07-11 (×2): 125 mL/h via INTRAVENOUS
  Administered 2014-07-12: 06:00:00 via INTRAVENOUS
  Filled 2014-07-10 (×6): qty 1000

## 2014-07-10 MED ORDER — LACTATED RINGERS IV SOLN
INTRAVENOUS | Status: DC | PRN
  Administered 2014-07-10: 1000 mL

## 2014-07-10 MED ORDER — SODIUM CHLORIDE 0.9 % IJ SOLN: 9.0000 mL | INTRAMUSCULAR | Status: DC | PRN

## 2014-07-10 MED ORDER — DIPHENHYDRAMINE HCL 50 MG/ML IJ SOLN: 12.5000 mg | Freq: Four times a day (QID) | INTRAMUSCULAR | Status: DC | PRN

## 2014-07-10 MED ORDER — LACTATED RINGERS IV SOLN
INTRAVENOUS | Status: DC | PRN
  Administered 2014-07-10 (×4): via INTRAVENOUS

## 2014-07-10 MED ORDER — HYDROMORPHONE HCL 1 MG/ML IJ SOLN
INTRAMUSCULAR | Status: AC
  Filled 2014-07-10: qty 1

## 2014-07-10 MED ORDER — HEPARIN SODIUM (PORCINE) 5000 UNIT/ML IJ SOLN
5000.0000 [IU] | Freq: Three times a day (TID) | INTRAMUSCULAR | Status: DC
  Administered 2014-07-11 – 2014-07-16 (×15): 5000 [IU] via SUBCUTANEOUS
  Filled 2014-07-10 (×18): qty 1

## 2014-07-10 MED ORDER — ONDANSETRON HCL 4 MG/2ML IJ SOLN: 4.0000 mg | Freq: Four times a day (QID) | INTRAMUSCULAR | Status: DC | PRN

## 2014-07-10 MED ORDER — PROMETHAZINE HCL 25 MG/ML IJ SOLN
INTRAMUSCULAR | Status: AC
  Filled 2014-07-10: qty 1

## 2014-07-10 MED ORDER — ONDANSETRON HCL 4 MG/2ML IJ SOLN
INTRAMUSCULAR | Status: AC
  Filled 2014-07-10: qty 2

## 2014-07-10 MED ORDER — FENTANYL CITRATE 0.05 MG/ML IJ SOLN
INTRAMUSCULAR | Status: DC | PRN
  Administered 2014-07-10: 50 ug via INTRAVENOUS
  Administered 2014-07-10 (×2): 25 ug via INTRAVENOUS
  Administered 2014-07-10: 50 ug via INTRAVENOUS
  Administered 2014-07-10: 25 ug via INTRAVENOUS
  Administered 2014-07-10: 50 ug via INTRAVENOUS
  Administered 2014-07-10 (×7): 25 ug via INTRAVENOUS
  Administered 2014-07-10: 50 ug via INTRAVENOUS
  Administered 2014-07-10 (×2): 25 ug via INTRAVENOUS

## 2014-07-10 MED ORDER — NALOXONE HCL 0.4 MG/ML IJ SOLN: 0.4000 mg | INTRAMUSCULAR | Status: DC | PRN

## 2014-07-10 MED ORDER — FENTANYL CITRATE 0.05 MG/ML IJ SOLN
INTRAMUSCULAR | Status: AC
  Filled 2014-07-10: qty 5

## 2014-07-10 MED ORDER — DIPHENHYDRAMINE HCL 12.5 MG/5ML PO ELIX: 12.5000 mg | ORAL_SOLUTION | Freq: Four times a day (QID) | ORAL | Status: DC | PRN

## 2014-07-10 MED ORDER — CISATRACURIUM BESYLATE (PF) 10 MG/5ML IV SOLN
INTRAVENOUS | Status: DC | PRN
  Administered 2014-07-10: 2 mg via INTRAVENOUS
  Administered 2014-07-10: 6 mg via INTRAVENOUS
  Administered 2014-07-10 (×3): 2 mg via INTRAVENOUS

## 2014-07-10 MED ORDER — 0.9 % SODIUM CHLORIDE (POUR BTL) OPTIME
TOPICAL | Status: DC | PRN
  Administered 2014-07-10: 3000 mL

## 2014-07-10 MED ORDER — ONDANSETRON HCL 4 MG/2ML IJ SOLN
INTRAMUSCULAR | Status: DC | PRN
  Administered 2014-07-10: 4 mg via INTRAVENOUS

## 2014-07-10 MED ORDER — DEXTROSE 5 % IV SOLN
2.0000 g | Freq: Two times a day (BID) | INTRAVENOUS | Status: AC
  Administered 2014-07-10: 2 g via INTRAVENOUS
  Filled 2014-07-10: qty 2

## 2014-07-10 MED ORDER — HYDROMORPHONE HCL 1 MG/ML IJ SOLN
0.2500 mg | INTRAMUSCULAR | Status: DC | PRN
  Administered 2014-07-10: 0.25 mg via INTRAVENOUS

## 2014-07-10 MED ORDER — ONDANSETRON HCL 4 MG PO TABS: 4.0000 mg | ORAL_TABLET | Freq: Four times a day (QID) | ORAL | Status: DC | PRN

## 2014-07-10 MED ORDER — ALVIMOPAN 12 MG PO CAPS
12.0000 mg | ORAL_CAPSULE | Freq: Once | ORAL | Status: AC
  Administered 2014-07-10: 12 mg via ORAL
  Filled 2014-07-10: qty 1

## 2014-07-10 MED ORDER — CEFOXITIN SODIUM 2 G IV SOLR
INTRAVENOUS | Status: AC
  Filled 2014-07-10: qty 2

## 2014-07-10 MED ORDER — BUPIVACAINE HCL (PF) 0.5 % IJ SOLN
INTRAMUSCULAR | Status: DC | PRN
  Administered 2014-07-10: 7 mL

## 2014-07-10 MED ORDER — GLYCOPYRROLATE 0.2 MG/ML IJ SOLN
INTRAMUSCULAR | Status: AC
  Filled 2014-07-10: qty 3

## 2014-07-10 MED ORDER — ESMOLOL HCL 10 MG/ML IV SOLN
INTRAVENOUS | Status: AC
  Filled 2014-07-10: qty 10

## 2014-07-10 MED ORDER — PROPOFOL 10 MG/ML IV BOLUS
INTRAVENOUS | Status: DC | PRN
  Administered 2014-07-10: 70 mg via INTRAVENOUS

## 2014-07-10 MED ORDER — MORPHINE SULFATE (PF) 1 MG/ML IV SOLN
INTRAVENOUS | Status: DC
  Administered 2014-07-10: 9.75 mg via INTRAVENOUS
  Administered 2014-07-10: 3 mg via INTRAVENOUS
  Administered 2014-07-10: 10 mg via INTRAVENOUS
  Administered 2014-07-10: 15:00:00 via INTRAVENOUS
  Administered 2014-07-10: 3 mg via INTRAVENOUS
  Administered 2014-07-11: 07:00:00 via INTRAVENOUS
  Administered 2014-07-11: 11.73 mg via INTRAVENOUS
  Administered 2014-07-11 (×2): 9 mg via INTRAVENOUS
  Administered 2014-07-11: 10.49 mg via INTRAVENOUS
  Administered 2014-07-12: 4.5 mg via INTRAVENOUS
  Administered 2014-07-12: 22.9 mg via INTRAVENOUS
  Administered 2014-07-12 (×2): 6 mg via INTRAVENOUS
  Administered 2014-07-12: 0 mg via INTRAVENOUS
  Administered 2014-07-12: 5.94 mg via INTRAVENOUS
  Administered 2014-07-13: 14.19 mg via INTRAVENOUS
  Administered 2014-07-13: 9 mg via INTRAVENOUS
  Administered 2014-07-13: 5.83 mg via INTRAVENOUS
  Filled 2014-07-10 (×7): qty 25

## 2014-07-10 MED ORDER — DEXAMETHASONE SODIUM PHOSPHATE 10 MG/ML IJ SOLN
INTRAMUSCULAR | Status: AC
  Filled 2014-07-10: qty 1

## 2014-07-10 MED ORDER — PANTOPRAZOLE SODIUM 40 MG IV SOLR
40.0000 mg | INTRAVENOUS | Status: DC
  Administered 2014-07-10 – 2014-07-14 (×5): 40 mg via INTRAVENOUS
  Filled 2014-07-10 (×6): qty 40

## 2014-07-10 MED ORDER — NEOSTIGMINE METHYLSULFATE 10 MG/10ML IV SOLN
INTRAVENOUS | Status: AC
  Filled 2014-07-10: qty 1

## 2014-07-10 MED ORDER — ONDANSETRON HCL 4 MG/2ML IJ SOLN
4.0000 mg | INTRAMUSCULAR | Status: DC | PRN
  Administered 2014-07-11 – 2014-07-12 (×2): 4 mg via INTRAVENOUS
  Filled 2014-07-10 (×3): qty 2

## 2014-07-10 MED ORDER — DEXTROSE 5 % IV SOLN
2.0000 g | INTRAVENOUS | Status: AC
  Administered 2014-07-10 (×2): 2 g via INTRAVENOUS

## 2014-07-10 MED ORDER — MORPHINE SULFATE (PF) 1 MG/ML IV SOLN
INTRAVENOUS | Status: AC
  Filled 2014-07-10: qty 25

## 2014-07-10 MED ORDER — SUCCINYLCHOLINE CHLORIDE 20 MG/ML IJ SOLN
INTRAMUSCULAR | Status: DC | PRN
  Administered 2014-07-10: 80 mg via INTRAVENOUS

## 2014-07-10 MED ORDER — ALVIMOPAN 12 MG PO CAPS
12.0000 mg | ORAL_CAPSULE | Freq: Two times a day (BID) | ORAL | Status: DC
  Administered 2014-07-11 – 2014-07-15 (×9): 12 mg via ORAL
  Filled 2014-07-10 (×10): qty 1

## 2014-07-10 MED ORDER — DEXTROSE 5 % IV SOLN
INTRAVENOUS | Status: AC
  Filled 2014-07-10: qty 2

## 2014-07-10 MED ORDER — ESMOLOL HCL 10 MG/ML IV SOLN
INTRAVENOUS | Status: DC | PRN
  Administered 2014-07-10 (×2): 10 mg via INTRAVENOUS

## 2014-07-10 MED ORDER — GLYCOPYRROLATE 0.2 MG/ML IJ SOLN
INTRAMUSCULAR | Status: DC | PRN
  Administered 2014-07-10: 0.6 mg via INTRAVENOUS

## 2014-07-10 MED ORDER — DEXAMETHASONE SODIUM PHOSPHATE 10 MG/ML IJ SOLN
INTRAMUSCULAR | Status: DC | PRN
  Administered 2014-07-10: 10 mg via INTRAVENOUS

## 2014-07-10 SURGICAL SUPPLY — 74 items
APPLIER CLIP 5 13 M/L LIGAMAX5 (MISCELLANEOUS)
APPLIER CLIP ROT 10 11.4 M/L (STAPLE)
BLADE EXTENDED COATED 6.5IN (ELECTRODE) IMPLANT
BLADE SURG SZ10 CARB STEEL (BLADE) ×2 IMPLANT
CABLE HIGH FREQUENCY MONO STRZ (ELECTRODE) IMPLANT
CELLS DAT CNTRL 66122 CELL SVR (MISCELLANEOUS) IMPLANT
CHLORAPREP W/TINT 26ML (MISCELLANEOUS) ×2 IMPLANT
CLIP APPLIE 5 13 M/L LIGAMAX5 (MISCELLANEOUS) IMPLANT
CLIP APPLIE ROT 10 11.4 M/L (STAPLE) IMPLANT
COVER MAYO STAND STRL (DRAPES) ×2 IMPLANT
DECANTER SPIKE VIAL GLASS SM (MISCELLANEOUS) ×2 IMPLANT
DISSECTOR BLUNT TIP ENDO 5MM (MISCELLANEOUS) IMPLANT
DRAIN CHANNEL 19F RND (DRAIN) ×2 IMPLANT
DRAPE LAPAROSCOPIC ABDOMINAL (DRAPES) ×2 IMPLANT
DRAPE SHEET LG 3/4 BI-LAMINATE (DRAPES) IMPLANT
DRAPE UTILITY XL STRL (DRAPES) ×2 IMPLANT
DRAPE WARM FLUID 44X44 (DRAPE) ×2 IMPLANT
DRSG OPSITE POSTOP 4X10 (GAUZE/BANDAGES/DRESSINGS) IMPLANT
DRSG OPSITE POSTOP 4X6 (GAUZE/BANDAGES/DRESSINGS) ×2 IMPLANT
DRSG OPSITE POSTOP 4X8 (GAUZE/BANDAGES/DRESSINGS) IMPLANT
DRSG TEGADERM 2-3/8X2-3/4 SM (GAUZE/BANDAGES/DRESSINGS) IMPLANT
DRSG TEGADERM 4X4.75 (GAUZE/BANDAGES/DRESSINGS) ×2 IMPLANT
ELECT PENCIL ROCKER SW 15FT (MISCELLANEOUS) ×4 IMPLANT
EVACUATOR SILICONE 100CC (DRAIN) ×2 IMPLANT
FILTER SMOKE EVAC LAPAROSHD (FILTER) IMPLANT
GAUZE SPONGE 2X2 8PLY STRL LF (GAUZE/BANDAGES/DRESSINGS) ×1 IMPLANT
GAUZE SPONGE 4X4 12PLY STRL (GAUZE/BANDAGES/DRESSINGS) ×2 IMPLANT
GLOVE ECLIPSE 8.0 STRL XLNG CF (GLOVE) ×4 IMPLANT
GLOVE INDICATOR 8.0 STRL GRN (GLOVE) ×4 IMPLANT
GOWN STRL REUS W/TWL XL LVL3 (GOWN DISPOSABLE) ×12 IMPLANT
KIT BASIN OR (CUSTOM PROCEDURE TRAY) ×2 IMPLANT
LEGGING LITHOTOMY PAIR STRL (DRAPES) ×2 IMPLANT
LIGASURE IMPACT 36 18CM CVD LR (INSTRUMENTS) IMPLANT
LUBRICANT JELLY K Y 4OZ (MISCELLANEOUS) IMPLANT
PACK GENERAL/GYN (CUSTOM PROCEDURE TRAY) ×2 IMPLANT
PAD ABD 8X10 STRL (GAUZE/BANDAGES/DRESSINGS) ×2 IMPLANT
RTRCTR WOUND ALEXIS 18CM MED (MISCELLANEOUS)
SCALPEL HARMONIC ACE (MISCELLANEOUS) ×2 IMPLANT
SCISSORS LAP 5X35 DISP (ENDOMECHANICALS) ×2 IMPLANT
SET IRRIG TUBING LAPAROSCOPIC (IRRIGATION / IRRIGATOR) ×2 IMPLANT
SHEARS HARMONIC ACE PLUS 36CM (ENDOMECHANICALS) IMPLANT
SLEEVE XCEL OPT CAN 5 100 (ENDOMECHANICALS) ×4 IMPLANT
SOLUTION ANTI FOG 6CC (MISCELLANEOUS) ×2 IMPLANT
SPONGE GAUZE 2X2 STER 10/PKG (GAUZE/BANDAGES/DRESSINGS) ×1
SPONGE LAP 18X18 X RAY DECT (DISPOSABLE) IMPLANT
STAPLER CUT CVD 40MM BLUE (STAPLE) ×2 IMPLANT
STAPLER PROXIMATE 75MM BLUE (STAPLE) ×2 IMPLANT
STAPLER VISISTAT 35W (STAPLE) ×2 IMPLANT
SUCTION POOLE TIP (SUCTIONS) ×2 IMPLANT
SUT ETHILON 3 0 PS 1 (SUTURE) ×2 IMPLANT
SUT MNCRL AB 4-0 PS2 18 (SUTURE) ×2 IMPLANT
SUT PDS AB 1 CTX 36 (SUTURE) ×4 IMPLANT
SUT PDS AB 1 TP1 96 (SUTURE) IMPLANT
SUT PROLENE 2 0 SH DA (SUTURE) ×2 IMPLANT
SUT SILK 2 0 (SUTURE) ×1
SUT SILK 2 0 SH CR/8 (SUTURE) ×2 IMPLANT
SUT SILK 2-0 18XBRD TIE 12 (SUTURE) ×1 IMPLANT
SUT SILK 3 0 (SUTURE) ×1
SUT SILK 3 0 SH CR/8 (SUTURE) ×2 IMPLANT
SUT SILK 3-0 18XBRD TIE 12 (SUTURE) ×1 IMPLANT
SUT VIC AB 3-0 SH 18 (SUTURE) ×2 IMPLANT
SYS LAPSCP GELPORT 120MM (MISCELLANEOUS) ×2
SYSTEM LAPSCP GELPORT 120MM (MISCELLANEOUS) ×1 IMPLANT
TAPE CLOTH SOFT 2X10 (GAUZE/BANDAGES/DRESSINGS) ×2 IMPLANT
TOWEL OR 17X26 10 PK STRL BLUE (TOWEL DISPOSABLE) ×4 IMPLANT
TOWEL OR NON WOVEN STRL DISP B (DISPOSABLE) ×4 IMPLANT
TRAY FOLEY CATH 14FRSI W/METER (CATHETERS) ×2 IMPLANT
TRAY LAPAROSCOPIC (CUSTOM PROCEDURE TRAY) ×2 IMPLANT
TROCAR BLADELESS OPT 5 100 (ENDOMECHANICALS) ×2 IMPLANT
TROCAR XCEL BLUNT TIP 100MML (ENDOMECHANICALS) IMPLANT
TROCAR XCEL NON-BLD 11X100MML (ENDOMECHANICALS) IMPLANT
TUBING CONNECTING 10 (TUBING) IMPLANT
TUBING INSUFFLATION 10FT LAP (TUBING) ×2 IMPLANT
YANKAUER SUCT BULB TIP 10FT TU (MISCELLANEOUS) ×2 IMPLANT

## 2014-07-10 NOTE — Anesthesia Postprocedure Evaluation (Signed)
Anesthesia Post Note  Patient: Alexis [name]  Procedure(s) Performed: Procedure(s) (LRB): LAPAROSCOPIC LYSIS OF ADHESIONS (90 MIN) LAPAROSCOPIC ASSISTED COLOSTOMY CLOSURE, RIGID PROCTOSIGMOIDOSCOPY (N/A)  Anesthesia type: General  Patient location: PACU  Post pain: Pain level controlled  Post assessment: Post-op Vital signs reviewed  Last Vitals: BP 141/66 mmHg  Pulse 75  Temp(Src) 36.5 C (Oral)  Resp 16  Ht 5\' 4"  (1.626 m)  Wt 128 lb (58.06 kg)  BMI 21.96 kg/m2  SpO2 96%  Post vital signs: Reviewed  Level of consciousness: sedated  Complications: No apparent anesthesia complications

## 2014-07-10 NOTE — Interval H&P Note (Signed)
History and Physical Interval Note:  07/10/2014 10:24 AM  Alexis [name]  has presented today for surgery, with the diagnosis of COLOSTOMY IN PLACE  The various methods of treatment have been discussed with the patient and family. After consideration of risks, benefits and other options for treatment, the patient has consented to  Procedure(s): LAPAROSCOPIC ASSISTED COLOSTOMY CLOSURE (N/A) as a surgical intervention .  The patient's history has been reviewed, patient examined, no change in status, stable for surgery.  I have reviewed the patient's chart and labs.  Questions were answered to the patient's satisfaction.     Keeli Roberg Lenna Sciara

## 2014-07-10 NOTE — H&P (View-Only) (Signed)
Patient ID: Alexis [name], female   DOB: 1951-04-01, 63 y.o.   MRN: [id number]  Rini Moffit. Biegler 06/12/2014 10:58 AM Location: [facility] Patient #: [id number] DOB: 10/22/1950 Married / Language: English / Race: White Female History of Present Illness Odis Hollingshead MD; 06/12/2014 5:57 PM) The patient is a 63 year old female who presents for a pre-op visit.  Note:She had a colonoscopy through the colostomy and rectum. No masses were noted. Barium enema demonstrated some scattered diverticular disease. The proximal end of the rectal stump is at the mid sacral area. She is here to discuss colostomy closure.  Allergies Ventura Sellers, [place]; 06/12/2014 11:00 AM) Codeine/Codeine Derivatives  Medication History Ventura Sellers, [place]; 06/12/2014 11:02 AM) Decadron (4MG  Tablet, Oral) Active. Folvite (5MG /ML Solution, Injection) Active. Ibuprofen (200MG  Capsule, Oral) Active. Remeron SolTab (30MG  Tablet Disperse, Oral) Active. Remeron (30MG  Tablet, Oral) Active. Multi Vitamin Daily (Oral) Active. Zofran ODT (8MG  Tablet Disperse, Oral prn) Active. Potassium Chloride (20 MEQ/15ML(10%) Liquid, Oral) Active. Compazine (10MG  Tablet, Oral) Active. Medications Reconciled    Vitals Sharyn Lull R. Brooks CMA; 06/12/2014 10:59 AM) 06/12/2014 10:59 AM Weight: 127.13 lb Height: 64in Body Surface Area: 1.61 m Body Mass Index: 21.82 kg/m Temp.: 97.71F(Temporal)  Pulse: 78 (Regular)  Resp.: 20 (Unlabored)  BP: 110/70 (Sitting, Left Arm, Standard)     Physical Exam Odis Hollingshead MD; 06/12/2014 5:59 PM)  The physical exam findings are as follows: Note:General: Thin female in NAD. Pleasant and cooperative. Her husband is present.  HEENT: [place]/AT, no facial masses  EYES: EOMI, no icterus  CV: RRR, no murmur, no JVD.  CHEST: Breath sounds equal and clear. Respirations nonlabored.  ABDOMEN: Soft, nontender, nondistended, midline scar, left  sided colostomy  NEUROLOGIC: Alert and oriented, answers questions appropriately, normal gait and station.  PSYCHIATRIC: Normal mood, affect , and behavior.    Assessment & Plan Odis Hollingshead MD; 06/12/2014 6:01 PM)  COLOSTOMY IN PLACE (V44.3  Z93.3) Impression: Metastatic lung cancer appears to be in check. Colonoscopy and BE reviewed. She wants to proceed with colostomy closure.  Plan: Laparoscopic assisted colostomy closure. I have explained the procedure and risks of colostomy closure. Risks include but are not limited to bleeding, infection, wound problems, anesthesia, anastomotic leak, need for colostomy, need for reoperative surgery, injury to intraabominal organs (such as intestine, spleen, kidney, bladder, ureter, etc.), ileus, irregular bowel habits. She seems to understand and wants to proceed.  Jackolyn Confer, M.D.

## 2014-07-10 NOTE — Op Note (Signed)
Operative Note  Alexis [name] female 63 y.o. 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         Surgeon: Odis Hollingshead   Assistants: Nedra Hai, M.D.  Anesthesia: General endotracheal anesthesia  Indications:   This is a 63 year old female s/p Hartmann procedure 8 months ago for perforated sigmoid diverticulitis.  This occurred during treatment for advanced lung cancer. She has responded  to lung cancer treatment and is completely recovered from the [facility] procedure.  She would like to have the colostomy reversed. Her oncologist feels it is safe to do this. She now presents for elective colostomy closure.    Procedure Detail:  She was brought to the operating room placed supine on the operating table and general anesthetic was given. She was placed in the lithotomy position. A Foley catheter was inserted. The colostomy appliance was removed. The abdominal wall peroneal areas were sterilely prepped and draped. A sterile Betadine soaked sponge was placed over the colostomy. An occlusive dressing was then placed over this. There is some leakage of classmate material laterally. We controlled this by way of suction.  She was placed in slight reverse Trendelenburg position. A 5 mm incision was made in the right upper quadrant. Using a 5 mm Optiview trocar and laparoscope access was gained into the peritoneal cavity. Inspection of the area underneath the trocar demonstrated no evidence of bleeding or organ injury. Adhesions were noted between the omentum and anterior abdominal wall in the midline area. There are also adhesions noted between the cyst cecum and abdominal wall just to the right of the midline. A 5 mm trocar was placed in the right mid abdomen. A 5 mm trocar was placed in the right lower quadrant. Using careful sharp and blunt  dissection began dissecting the omental and some intestinal adhesions free from the anterior abdominal wall and that this extended over toward the area of the colostomy. A small serosal tear was made in the small intestine and this was addressed later in the operation.  In this fashion, the area underneath the lower midline incision was completely freed up of adhesions. I then was able to free up all adhesions around the colostomy. There were adhesions between the small bowel and descending colon leading to the colostomy and these were freed up using blunt and sharp dissection giving more mobilization to the descending colon. Adhesions between the small bowel and pelvis were then mobilized bluntly and sharply. At this point, I made a limited lower midline incision through the previous incision. There were some small bowel adhesions to the pelvic area. These were easily mobilized. I then identified a Prolene suture and was able to identify the rectal stump. I was able to mobilize this and get good length.  I then ran the small bowel. The small serosal tear was closed in 2 layers using interrupted 3-0 Vicryl sutures and then interrupted 3-0 silk sutures. No other serosal injuries or other organ injuries were noted.  Following this, a size 25 and size 29 EEA sizer was replaced in the anus up into the rectal pouch without difficulty. I then took the colostomy down. An elliptical incision was made in the skin adjacent to the colostomy. Colostomy was freed up from the subcutaneous and fascial adhesions and brought back into the abdominal cavity. There was adequate length for it to stretch down into  the pelvis for an anastomosis under no tension. I placed a size 29 anvil through the colostomy and then brought it out the side of the descending colon. I then removed with the colostomy and a small amount of colon using the GIA stapler. A pursestring suture of 2-0 Prolene was placed around the anvil to hold it in  place.  The EEA staple handle was then introduced to the anus into the rectum. An anterior rectal wall to side descending colon anastomosis was then performed with the EEA stapler. 2 complete donuts were noted. Anastomosis was patent, viable, and under no tension. The anastomosis was placed under saline solution. Rigid proctosigmoidoscopy was performed.  Air was insufflated and no air leak was noted. Fluid was evacuated.  The abdominal cavity was copiously irrigated. There was no evidence of organ injury or bleeding. The trochars were removed. A size 19 Blake drain was placed to the right lower quadrant trocar incision and positioned in the pelvis. It was anchored to the skin with 3-0 nylon suture.  The colostomy site fascia was then closed with running #1 PDS suture. The limited lower midline incision was then closed with running #1 PDS suture. The subcutaneous tissues irrigated. The skin of the limited lower midline incision was closed with staples. The trocar site skin incisions were closed with 4-0 Monocryl subcuticular stitches. The previous colostomy site was packed with saline moistened gauze. Dry dressings were applied. Needle sponge and instrument counts were reported to be correct before closure.  She tolerated the procedure well without any apparent complications and was taken to the recovery room in satisfactory condition.   Estimated Blood Loss:  300 mL         Drains: Blake drain          Specimens: Colostomy        Complications:  * No complications entered in OR log *         Disposition: PACU - hemodynamically stable.         Condition: stable

## 2014-07-10 NOTE — Transfer of Care (Signed)
Immediate Anesthesia Transfer of Care Note  Patient: Alexis [name]  Procedure(s) Performed: Procedure(s): LAPAROSCOPIC LYSIS OF ADHESIONS (90 MIN) LAPAROSCOPIC ASSISTED COLOSTOMY CLOSURE, RIGID PROCTOSIGMOIDOSCOPY (N/A)  Patient Location: PACU  Anesthesia Type:General  Level of Consciousness: awake, alert , oriented and patient cooperative  Airway & Oxygen Therapy: Patient Spontanous Breathing and Patient connected to face mask oxygen  Post-op Assessment: Report given to PACU RN and Post -op Vital signs reviewed and stable  Post vital signs: Reviewed and stable  Complications: No apparent anesthesia complications

## 2014-07-10 NOTE — Anesthesia Preprocedure Evaluation (Signed)
Anesthesia Evaluation  Patient identified by MRN, date of birth, ID band Patient awake    Reviewed: Allergy & Precautions, H&P , NPO status , Patient's Chart, lab work & pertinent test results  Airway Mallampati: II  TM Distance: >3 FB Neck ROM: full    Dental no notable dental hx. 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 40 py former smoker. breath sounds clear to auscultation  Pulmonary exam normal       Cardiovascular negative cardio ROS  Rhythm:regular Rate:Normal     Neuro/Psych negative neurological ROS  negative psych ROS   GI/Hepatic negative GI ROS, Neg liver ROS,   Endo/Other  negative endocrine ROS  Renal/GU negative Renal ROS     Musculoskeletal   Abdominal   Peds  Hematology negative hematology ROS (+) anemia ,   Anesthesia Other Findings   Reproductive/Obstetrics negative OB ROS                             Anesthesia Physical  Anesthesia Plan  ASA: II  Anesthesia Plan: General   Post-op Pain Management:    Induction: Intravenous  Airway Management Planned: Oral ETT  Additional Equipment:   Intra-op Plan:   Post-operative Plan: Extubation in OR  Informed Consent: I have reviewed the patients History and Physical, chart, labs and discussed the procedure including the risks, benefits and alternatives for the proposed anesthesia with the patient or authorized representative who has indicated his/her understanding and acceptance.   Dental advisory given  Plan Discussed with: CRNA  Anesthesia Plan Comments:         Anesthesia Quick Evaluation

## 2014-07-11 ENCOUNTER — Encounter (HOSPITAL_COMMUNITY): Payer: Self-pay | Admitting: General Surgery

## 2014-07-11 LAB — BASIC METABOLIC PANEL
Anion gap: 11 (ref 5–15)
BUN: 8 mg/dL (ref 6–23)
CO2: 28 mEq/L (ref 19–32)
Calcium: 9.2 mg/dL (ref 8.4–10.5)
Chloride: 102 mEq/L (ref 96–112)
Creatinine, Ser: 0.61 mg/dL (ref 0.50–1.10)
GFR calc Af Amer: >90 mL/min (ref >90)
GFR calc non Af Amer: >90 mL/min (ref >90)
Glucose, Bld: 142 mg/dL — ABNORMAL HIGH (ref 70–99)
Potassium: 4.5 mEq/L (ref 3.7–5.3)
Sodium: 141 mEq/L (ref 137–147)

## 2014-07-11 LAB — CBC
HCT: 35 % — ABNORMAL LOW (ref 36.0–46.0)
Hemoglobin: 11 g/dL — ABNORMAL LOW (ref 12.0–15.0)
MCH: 32.8 pg (ref 26.0–34.0)
MCHC: 31.4 g/dL (ref 30.0–36.0)
MCV: 104.5 fL — ABNORMAL HIGH (ref 78.0–100.0)
Platelets: 250 10*3/uL (ref 150–400)
RBC: 3.35 MIL/uL — ABNORMAL LOW (ref 3.87–5.11)
RDW: 13.6 % (ref 11.5–15.5)
WBC: 11.3 10*3/uL — ABNORMAL HIGH (ref 4.0–10.5)

## 2014-07-11 MED ORDER — SODIUM CHLORIDE 0.9 % IV BOLUS (SEPSIS)
500.0000 mL | Freq: Once | INTRAVENOUS | Status: AC
  Administered 2014-07-11: 166 mL via INTRAVENOUS

## 2014-07-11 NOTE — Care Management Note (Signed)
    Page 1 of 1   07/11/2014     11:16:03 AM CARE MANAGEMENT NOTE 07/11/2014  Patient:  Karaffa,Huxley L   Account Number:  [id number]  Date Initiated:  07/11/2014  Documentation initiated by:  Sunday Spillers  Subjective/Objective Assessment:   63 yo female admitted s/p Lap assisted lysis of adhesions,  colostomy closure, rigid proctosigmoidoscopy. PTA lived at home with spouse.     Action/Plan:   Home when stable   Anticipated DC Date:  07/14/2014   Anticipated DC Plan:  [place]  CM consult      Choice offered to / List presented to:             Status of service:  In process, will continue to follow Medicare Important Message given?   (If response is "NO", the following Medicare IM given date fields will be blank) Date Medicare IM given:   Medicare IM given by:   Date Additional Medicare IM given:   Additional Medicare IM given by:    Discharge Disposition:    Per UR Regulation:  Reviewed for med. necessity/level of care/duration of stay  If discussed at [place] of Stay Meetings, dates discussed:    Comments:  07-11-14 Sunday Spillers RN CM 1100 Patient states husband can to wound care at d/c, has done this in the past. Will continue to follow for d/c needs and wound issues.

## 2014-07-11 NOTE — Progress Notes (Signed)
1 Day Post-Op  Subjective: Very sore.  Having nausea.  Objective: Vital signs in last 24 hours: Temp:  [97.3 F (36.3 C)-98.8 F (37.1 C)] 97.8 F (36.6 C) (11/13 0514) Pulse Rate:  [62-107] 67 (11/13 0514) Resp:  [10-19] 18 (11/13 0631) BP: (95-151)/(42-90) 95/42 mmHg (11/13 0514) SpO2:  [94 %-100 %] 98 % (11/13 0631) Weight:  [127 lb 13.9 oz (58 kg)-128 lb (58.06 kg)] 127 lb 13.9 oz (58 kg) (11/12 1700) Last BM Date: 07/09/14  Intake/Output from previous day: 11/12 0701 - 11/13 0700 In: 4295.8 [I.V.:4295.8] Out: 1360 [Urine:925; Drains:135; Blood:300] Intake/Output this shift:    PE: General- In NAD Abdomen-soft, dressings dry, serosanguinous drain output, hypoactive bowel sounds.  Lab Results:   Recent Labs  07/10/14 0955 07/11/14 0436  WBC 6.1 11.3*  HGB 12.7 11.0*  HCT 38.9 35.0*  PLT 276 250   BMET  Recent Labs  07/10/14 0955 07/11/14 0436  NA 139 141  K 3.5* 4.5  CL 101 102  CO2 23 28  GLUCOSE 115* 142*  BUN 9 8  CREATININE 0.71 0.61  CALCIUM 10.0 9.2   PT/INR  Recent Labs  07/10/14 0955  LABPROT 13.5  INR 1.02   Comprehensive Metabolic Panel:    Component Value Date/Time   NA 141 07/11/2014 0436   NA 139 07/10/2014 0955   NA 142 06/09/2014 1000   NA 142 05/19/2014 0958   K 4.5 07/11/2014 0436   K 3.5* 07/10/2014 0955   K 3.7 06/09/2014 1000   K 4.1 05/19/2014 0958   CL 102 07/11/2014 0436   CL 101 07/10/2014 0955   CO2 28 07/11/2014 0436   CO2 23 07/10/2014 0955   CO2 26 06/09/2014 1000   CO2 25 05/19/2014 0958   BUN 8 07/11/2014 0436   BUN 9 07/10/2014 0955   BUN 10.5 06/09/2014 1000   BUN 14.8 05/19/2014 0958   CREATININE 0.61 07/11/2014 0436   CREATININE 0.71 07/10/2014 0955   CREATININE 0.7 06/09/2014 1000   CREATININE 0.7 05/19/2014 0958   GLUCOSE 142* 07/11/2014 0436   GLUCOSE 115* 07/10/2014 0955   GLUCOSE 124 06/09/2014 1000   GLUCOSE 122 05/19/2014 0958   CALCIUM 9.2 07/11/2014 0436   CALCIUM 10.0 07/10/2014  0955   CALCIUM 9.6 06/09/2014 1000   CALCIUM 9.6 05/19/2014 0958   AST 15 07/10/2014 0955   AST 11 06/09/2014 1000   AST 10 05/19/2014 0958   AST 11 04/07/2014 1037   ALT 8 07/10/2014 0955   ALT 9 06/09/2014 1000   ALT 7 05/19/2014 0958   ALT 12 04/07/2014 1037   ALKPHOS 91 07/10/2014 0955   ALKPHOS 71 06/09/2014 1000   ALKPHOS 69 05/19/2014 0958   ALKPHOS 62 04/07/2014 1037   BILITOT 0.4 07/10/2014 0955   BILITOT 0.20 06/09/2014 1000   BILITOT 0.25 05/19/2014 0958   BILITOT 0.6 04/07/2014 1037   PROT 7.8 07/10/2014 0955   PROT 6.9 06/09/2014 1000   PROT 7.1 05/19/2014 0958   PROT 6.2 04/07/2014 1037   ALBUMIN 4.1 07/10/2014 0955   ALBUMIN 3.5 06/09/2014 1000   ALBUMIN 3.7 05/19/2014 0958   ALBUMIN 3.8 04/07/2014 1037     Studies/Results: No results found.  Anti-infectives: Anti-infectives    Start     Dose/Rate Route Frequency Ordered Stop   07/10/14 2200  cefoTEtan (CEFOTAN) 2 g in dextrose 5 % 50 mL IVPB     2 g100 mL/hr over 30 Minutes Intravenous Every 12 hours 07/10/14 1604 07/10/14  2252   07/10/14 0920  cefOXitin (MEFOXIN) 2 g in dextrose 5 % 50 mL IVPB     2 g100 mL/hr over 30 Minutes Intravenous On call to O.R. 07/10/14 0920 07/10/14 1332      Assessment Active Problems:   Status post laparoscopic assisted colostomy closure 07/10/14-some nausea this AM  Stage IV lung cancer-being treated by Dr. Julien Nordmann    LOS: 1 day   Plan: OOB.  Sips of water and ice chips.  Remove bandages tomorrow and start dressing changes to colostomy site.   Leeona Mccardle J 07/11/2014

## 2014-07-11 NOTE — Progress Notes (Signed)
Pt/s b/p was 100/30 at 1400 w/ pt resting in bed.  Pt rechecked- b/p at 1500 102/43.  Pt c/o light-headedness.  Alexis [name] notified at 1600 and orders received.

## 2014-07-11 NOTE — Plan of Care (Signed)
Problem: Phase I Progression Outcomes Goal: OOB as tolerated unless otherwise ordered Outcome: Progressing     

## 2014-07-11 NOTE — Plan of Care (Signed)
Problem: Phase I Progression Outcomes Goal: Pain controlled with appropriate interventions Outcome: Completed/Met Date Met:  07/11/14 Goal: OOB as tolerated unless otherwise ordered Outcome: Progressing Goal: Incision/dressings dry and intact Outcome: Completed/Met Date Met:  07/11/14 Goal: Tubes/drains patent Outcome: Completed/Met Date Met:  07/11/14

## 2014-07-12 LAB — CBC
HCT: 28.8 % — ABNORMAL LOW (ref 36.0–46.0)
Hemoglobin: 9.1 g/dL — ABNORMAL LOW (ref 12.0–15.0)
MCH: 33.2 pg (ref 26.0–34.0)
MCHC: 31.6 g/dL (ref 30.0–36.0)
MCV: 105.1 fL — ABNORMAL HIGH (ref 78.0–100.0)
Platelets: 169 10*3/uL (ref 150–400)
RBC: 2.74 MIL/uL — ABNORMAL LOW (ref 3.87–5.11)
RDW: 14 % (ref 11.5–15.5)
WBC: 6.8 10*3/uL (ref 4.0–10.5)

## 2014-07-12 LAB — BASIC METABOLIC PANEL
Anion gap: 7 (ref 5–15)
BUN: 5 mg/dL — ABNORMAL LOW (ref 6–23)
CO2: 32 mEq/L (ref 19–32)
Calcium: 9 mg/dL (ref 8.4–10.5)
Chloride: 100 mEq/L (ref 96–112)
Creatinine, Ser: 0.55 mg/dL (ref 0.50–1.10)
GFR calc Af Amer: >90 mL/min (ref >90)
GFR calc non Af Amer: >90 mL/min (ref >90)
Glucose, Bld: 127 mg/dL — ABNORMAL HIGH (ref 70–99)
Potassium: 3.9 mEq/L (ref 3.7–5.3)
Sodium: 139 mEq/L (ref 137–147)

## 2014-07-12 MED ORDER — KCL IN DEXTROSE-NACL 20-5-0.45 MEQ/L-%-% IV SOLN
INTRAVENOUS | Status: DC
  Administered 2014-07-12 – 2014-07-15 (×6): via INTRAVENOUS
  Filled 2014-07-12 (×9): qty 1000

## 2014-07-12 NOTE — Progress Notes (Signed)
2 Days Post-Op  Subjective: A little nausea overnight, no flatus, pain controlled   Objective: Vital signs in last 24 hours: Temp:  [98.6 F (37 C)-99 F (37.2 C)] 99 F (37.2 C) (11/14 0605) Pulse Rate:  [69-96] 96 (11/14 0605) Resp:  [8-18] 18 (11/14 0605) BP: (100-112)/(30-58) 112/50 mmHg (11/13 2200) SpO2:  [91 %-100 %] 96 % (11/14 0605) Last BM Date: 07/09/14  Intake/Output from previous day: 11/13 0701 - 11/14 0700 In: 3875 [I.V.:3375; IV Piggyback:500] Out: 2730 [Urine:2700; Drains:30] Intake/Output this shift:   pulm clear bilaterally cv rrr abd incision clean, ostomy site without infection, drain serous, no bs, soft, approp tender   Lab Results:   Recent Labs  07/11/14 0436 07/12/14 0512  WBC 11.3* 6.8  HGB 11.0* 9.1*  HCT 35.0* 28.8*  PLT 250 169   BMET  Recent Labs  07/11/14 0436 07/12/14 0512  NA 141 139  K 4.5 3.9  CL 102 100  CO2 28 32  GLUCOSE 142* 127*  BUN 8 5*  CREATININE 0.61 0.55  CALCIUM 9.2 9.0   PT/INR  Recent Labs  07/10/14 0955  LABPROT 13.5  INR 1.02   ABG No results for input(s): PHART, HCO3 in the last 72 hours.  Invalid input(s): PCO2, PO2  Studies/Results: No results found.  Anti-infectives: Anti-infectives    Start     Dose/Rate Route Frequency Ordered Stop   07/10/14 2200  cefoTEtan (CEFOTAN) 2 g in dextrose 5 % 50 mL IVPB     2 g100 mL/hr over 30 Minutes Intravenous Every 12 hours 07/10/14 1604 07/10/14 2252   07/10/14 0920  cefOXitin (MEFOXIN) 2 g in dextrose 5 % 50 mL IVPB     2 g100 mL/hr over 30 Minutes Intravenous On call to O.R. 07/10/14 0920 07/10/14 1332      Assessment/Plan: POD 2 colostomy closure 1. Continue pca until po 2. pulm toilet, oob today 3. Sips/chips due to no flatus and some nausea overnight 4. Change ivf 5. hct lower but likely dilutional will recheck in am 6. Heparin, protonix, scds   [facility] 07/12/2014

## 2014-07-13 LAB — CBC
HCT: 28.8 % — ABNORMAL LOW (ref 36.0–46.0)
Hemoglobin: 9.3 g/dL — ABNORMAL LOW (ref 12.0–15.0)
MCH: 33.5 pg (ref 26.0–34.0)
MCHC: 32.3 g/dL (ref 30.0–36.0)
MCV: 103.6 fL — ABNORMAL HIGH (ref 78.0–100.0)
Platelets: 171 10*3/uL (ref 150–400)
RBC: 2.78 MIL/uL — ABNORMAL LOW (ref 3.87–5.11)
RDW: 13.6 % (ref 11.5–15.5)
WBC: 3.8 10*3/uL — ABNORMAL LOW (ref 4.0–10.5)

## 2014-07-13 MED ORDER — ACETAMINOPHEN 10 MG/ML IV SOLN
1000.0000 mg | Freq: Four times a day (QID) | INTRAVENOUS | Status: AC
  Administered 2014-07-13 – 2014-07-14 (×4): 1000 mg via INTRAVENOUS
  Filled 2014-07-13 (×4): qty 100

## 2014-07-13 MED ORDER — MORPHINE SULFATE 2 MG/ML IJ SOLN
2.0000 mg | INTRAMUSCULAR | Status: DC | PRN
  Administered 2014-07-13 – 2014-07-15 (×16): 2 mg via INTRAVENOUS
  Filled 2014-07-13 (×17): qty 1

## 2014-07-13 NOTE — Plan of Care (Signed)
Problem: Phase I Progression Outcomes Goal: OOB as tolerated unless otherwise ordered Outcome: Progressing Goal: Sutures/staples intact Outcome: Completed/Met Date Met:  07/13/14 Goal: Voiding-avoid urinary catheter unless indicated Outcome: Completed/Met Date Met:  07/13/14  Problem: Phase II Progression Outcomes Goal: Pain controlled Outcome: Progressing Pain controlled via PCA Goal: Progressing with IS, TCDB Outcome: Not Progressing Consistently requiring VCs & demo to perform properly. Goal: Sutures/staples intact Outcome: Completed/Met Date Met:  07/13/14 Goal: Foley discontinued Outcome: Completed/Met Date Met:  07/13/14     

## 2014-07-13 NOTE — Plan of Care (Signed)
Problem: Consults Goal: Skin Care Protocol Initiated - if Braden Score 18 or less If consults are not indicated, leave blank or document N/A  Outcome: Not Applicable Date Met:  00/18/09 Goal: Diabetes Guidelines if Diabetic/Glucose > 140 If diabetic or lab glucose is > 140 mg/dl - Initiate Diabetes/Hyperglycemia Guidelines & Document Interventions  Outcome: Not Applicable Date Met:  70/44/92  Problem: Phase I Progression Outcomes Goal: Vital signs/hemodynamically stable Outcome: Completed/Met Date Met:  07/13/14  Problem: Phase II Progression Outcomes Goal: Pain controlled Outcome: Completed/Met Date Met:  07/13/14 Goal: Progressing with IS, TCDB Outcome: Completed/Met Date Met:  07/13/14 Goal: Vital signs stable Outcome: Completed/Met Date Met:  07/13/14 Goal: Surgical site without signs of infection Outcome: Completed/Met Date Met:  07/13/14  Problem: Phase III Progression Outcomes Goal: Voiding independently Outcome: Completed/Met Date Met:  07/13/14 Goal: Nasogastric tube discontinued Outcome: Completed/Met Date Met:  07/13/14

## 2014-07-13 NOTE — Progress Notes (Addendum)
3 Days Post-Op  Subjective: Some nausea yesterday, no flatus/bm, ambulating, pain controlled  Objective: Vital signs in last 24 hours: Temp:  [98.8 F (37.1 C)-99 F (37.2 C)] 99 F (37.2 C) (11/15 8118) Pulse Rate:  [75-94] 94 (11/15 0632) Resp:  [11-19] 11 (11/15 0822) BP: (109-134)/(52-70) 128/70 mmHg (11/15 0632) SpO2:  [96 %-100 %] 100 % (11/15 0822) Last BM Date: 07/09/14  Intake/Output from previous day: 11/14 0701 - 11/15 0700 In: 2197.9 [I.V.:2197.9] Out: 1970 [Urine:1950; Drains:20] Intake/Output this shift:    General appearance: no distress GI: no bs incision clean, ostomy site without infection, drain serous, approp tender  Lab Results:   Recent Labs  07/12/14 0512 07/13/14 0527  WBC 6.8 3.8*  HGB 9.1* 9.3*  HCT 28.8* 28.8*  PLT 169 171   BMET  Recent Labs  07/11/14 0436 07/12/14 0512  NA 141 139  K 4.5 3.9  CL 102 100  CO2 28 32  GLUCOSE 142* 127*  BUN 8 5*  CREATININE 0.61 0.55  CALCIUM 9.2 9.0   PT/INR  Recent Labs  07/10/14 0955  LABPROT 13.5  INR 1.02   ABG No results for input(s): PHART, HCO3 in the last 72 hours.  Invalid input(s): PCO2, PO2  Studies/Results: No results found.  Anti-infectives: Anti-infectives    Start     Dose/Rate Route Frequency Ordered Stop   07/10/14 2200  cefoTEtan (CEFOTAN) 2 g in dextrose 5 % 50 mL IVPB     2 g100 mL/hr over 30 Minutes Intravenous Every 12 hours 07/10/14 1604 07/10/14 2252   07/10/14 0920  cefOXitin (MEFOXIN) 2 g in dextrose 5 % 50 mL IVPB     2 g100 mL/hr over 30 Minutes Intravenous On call to O.R. 07/10/14 0920 07/10/14 1332      Assessment/Plan: POD 3 colostomy closure 1. Will dc pca today and give prn pain meds 2. pulm toilet, oob today 3. Sips/chips continued until ileus resolves 4. Cont dressing changes 5. Heparin, protonix, scds 6. Heme hct stable today  Alexis [name] 07/13/2014

## 2014-07-14 MED ORDER — LIP MEDEX EX OINT
TOPICAL_OINTMENT | CUTANEOUS | Status: AC
Start: 2014-07-14 — End: 2014-07-14
  Administered 2014-07-14: 10:00:00
  Filled 2014-07-14: qty 7

## 2014-07-14 NOTE — Progress Notes (Signed)
4 Days Post-Op  Subjective: Nausea better.  Passing gas.  Objective: Vital signs in last 24 hours: Temp:  [98.1 F (36.7 C)-98.5 F (36.9 C)] 98.5 F (36.9 C) (11/16 0520) Pulse Rate:  [72-73] 72 (11/16 0520) Resp:  [14-16] 16 (11/16 0520) BP: (115-129)/(48-62) 129/62 mmHg (11/16 0520) SpO2:  [94 %-98 %] 96 % (11/16 0520) Last BM Date: 07/09/14  Intake/Output from previous day: 11/15 0701 - 11/16 0700 In: 3600 [I.V.:2800; IV Piggyback:800] Out: 9518 [Urine:1650; Drains:25] Intake/Output this shift: Total I/O In: -  Out: 350 [Urine:350]  PE: General- In NAD Abdomen-soft, midline incision and smaller incisions are clean and intact, serous drain output, open LUQ wound is clean, active bowel sounds.  Lab Results:   Recent Labs  07/12/14 0512 07/13/14 0527  WBC 6.8 3.8*  HGB 9.1* 9.3*  HCT 28.8* 28.8*  PLT 169 171   BMET  Recent Labs  07/12/14 0512  NA 139  K 3.9  CL 100  CO2 32  GLUCOSE 127*  BUN 5*  CREATININE 0.55  CALCIUM 9.0   PT/INR No results for input(s): LABPROT, INR in the last 72 hours. Comprehensive Metabolic Panel:    Component Value Date/Time   NA 139 07/12/2014 0512   NA 141 07/11/2014 0436   NA 142 06/09/2014 1000   NA 142 05/19/2014 0958   K 3.9 07/12/2014 0512   K 4.5 07/11/2014 0436   K 3.7 06/09/2014 1000   K 4.1 05/19/2014 0958   CL 100 07/12/2014 0512   CL 102 07/11/2014 0436   CO2 32 07/12/2014 0512   CO2 28 07/11/2014 0436   CO2 26 06/09/2014 1000   CO2 25 05/19/2014 0958   BUN 5* 07/12/2014 0512   BUN 8 07/11/2014 0436   BUN 10.5 06/09/2014 1000   BUN 14.8 05/19/2014 0958   CREATININE 0.55 07/12/2014 0512   CREATININE 0.61 07/11/2014 0436   CREATININE 0.7 06/09/2014 1000   CREATININE 0.7 05/19/2014 0958   GLUCOSE 127* 07/12/2014 0512   GLUCOSE 142* 07/11/2014 0436   GLUCOSE 124 06/09/2014 1000   GLUCOSE 122 05/19/2014 0958   CALCIUM 9.0 07/12/2014 0512   CALCIUM 9.2 07/11/2014 0436   CALCIUM 9.6 06/09/2014 1000    CALCIUM 9.6 05/19/2014 0958   AST 15 07/10/2014 0955   AST 11 06/09/2014 1000   AST 10 05/19/2014 0958   AST 11 04/07/2014 1037   ALT 8 07/10/2014 0955   ALT 9 06/09/2014 1000   ALT 7 05/19/2014 0958   ALT 12 04/07/2014 1037   ALKPHOS 91 07/10/2014 0955   ALKPHOS 71 06/09/2014 1000   ALKPHOS 69 05/19/2014 0958   ALKPHOS 62 04/07/2014 1037   BILITOT 0.4 07/10/2014 0955   BILITOT 0.20 06/09/2014 1000   BILITOT 0.25 05/19/2014 0958   BILITOT 0.6 04/07/2014 1037   PROT 7.8 07/10/2014 0955   PROT 6.9 06/09/2014 1000   PROT 7.1 05/19/2014 0958   PROT 6.2 04/07/2014 1037   ALBUMIN 4.1 07/10/2014 0955   ALBUMIN 3.5 06/09/2014 1000   ALBUMIN 3.7 05/19/2014 0958   ALBUMIN 3.8 04/07/2014 1037     Studies/Results: No results found.  Anti-infectives: Anti-infectives    Start     Dose/Rate Route Frequency Ordered Stop   07/10/14 2200  cefoTEtan (CEFOTAN) 2 g in dextrose 5 % 50 mL IVPB     2 g100 mL/hr over 30 Minutes Intravenous Every 12 hours 07/10/14 1604 07/10/14 2252   07/10/14 0920  cefOXitin (MEFOXIN) 2 g in dextrose 5 %  50 mL IVPB     2 g100 mL/hr over 30 Minutes Intravenous On call to O.R. 07/10/14 0920 07/10/14 1332      Assessment Active Problems:   S/p closure of colostomy 07/10/14-bowel function starting to return.   ABL anemia-stable    LOS: 4 days   Plan: Liquid diet.  Decrease IVF.   Zymire Turnbo J 07/14/2014

## 2014-07-14 NOTE — Plan of Care (Signed)
Problem: Phase II Progression Outcomes Goal: Return of bowel function (flatus, BM) IF ABDOMINAL SURGERY:  Outcome: Progressing Goal: Discharge plan established Outcome: Progressing

## 2014-07-14 NOTE — Plan of Care (Signed)
Problem: Phase I Progression Outcomes Goal: OOB as tolerated unless otherwise ordered Outcome: Completed/Met Date Met:  07/14/14  Problem: Phase II Progression Outcomes Goal: Dressings dry/intact Outcome: Not Applicable Date Met:  05/27/56

## 2014-07-15 MED ORDER — PANTOPRAZOLE SODIUM 40 MG PO TBEC
40.0000 mg | DELAYED_RELEASE_TABLET | Freq: Every day | ORAL | Status: DC
  Administered 2014-07-15: 40 mg via ORAL
  Filled 2014-07-15 (×2): qty 1

## 2014-07-15 MED ORDER — OXYCODONE HCL 5 MG PO TABS
5.0000 mg | ORAL_TABLET | ORAL | Status: DC | PRN
  Administered 2014-07-15 – 2014-07-16 (×3): 10 mg via ORAL
  Filled 2014-07-15 (×3): qty 2

## 2014-07-15 NOTE — Progress Notes (Signed)
5 Days Post-Op  Subjective: Had episodes of cold chills that resolved once bowels started moving.  Had 4 BMs.  No N/V.  Objective: Vital signs in last 24 hours: Temp:  [98.8 F (37.1 C)-99.5 F (37.5 C)] 99.4 F (37.4 C) (11/17 0545) Pulse Rate:  [69-91] 85 (11/17 0545) Resp:  [16] 16 (11/17 0545) BP: (130-139)/(63-79) 137/79 mmHg (11/17 0545) SpO2:  [97 %-99 %] 97 % (11/17 0545) Last BM Date: 07/14/14  Intake/Output from previous day: 11/16 0701 - 11/17 0700 In: 1987.5 [P.O.:120; I.V.:1867.5] Out: 2555 [Urine:2550; Drains:5] Intake/Output this shift:    PE: General- In NAD Abdomen-soft, incisions are clean and intact, active bowel sounds, serous drain output, left side open wound clean  Lab Results:   Recent Labs  07/13/14 0527  WBC 3.8*  HGB 9.3*  HCT 28.8*  PLT 171   BMET No results for input(s): NA, K, CL, CO2, GLUCOSE, BUN, CREATININE, CALCIUM in the last 72 hours. PT/INR No results for input(s): LABPROT, INR in the last 72 hours. Comprehensive Metabolic Panel:    Component Value Date/Time   NA 139 07/12/2014 0512   NA 141 07/11/2014 0436   NA 142 06/09/2014 1000   NA 142 05/19/2014 0958   K 3.9 07/12/2014 0512   K 4.5 07/11/2014 0436   K 3.7 06/09/2014 1000   K 4.1 05/19/2014 0958   CL 100 07/12/2014 0512   CL 102 07/11/2014 0436   CO2 32 07/12/2014 0512   CO2 28 07/11/2014 0436   CO2 26 06/09/2014 1000   CO2 25 05/19/2014 0958   BUN 5* 07/12/2014 0512   BUN 8 07/11/2014 0436   BUN 10.5 06/09/2014 1000   BUN 14.8 05/19/2014 0958   CREATININE 0.55 07/12/2014 0512   CREATININE 0.61 07/11/2014 0436   CREATININE 0.7 06/09/2014 1000   CREATININE 0.7 05/19/2014 0958   GLUCOSE 127* 07/12/2014 0512   GLUCOSE 142* 07/11/2014 0436   GLUCOSE 124 06/09/2014 1000   GLUCOSE 122 05/19/2014 0958   CALCIUM 9.0 07/12/2014 0512   CALCIUM 9.2 07/11/2014 0436   CALCIUM 9.6 06/09/2014 1000   CALCIUM 9.6 05/19/2014 0958   AST 15 07/10/2014 0955   AST 11  06/09/2014 1000   AST 10 05/19/2014 0958   AST 11 04/07/2014 1037   ALT 8 07/10/2014 0955   ALT 9 06/09/2014 1000   ALT 7 05/19/2014 0958   ALT 12 04/07/2014 1037   ALKPHOS 91 07/10/2014 0955   ALKPHOS 71 06/09/2014 1000   ALKPHOS 69 05/19/2014 0958   ALKPHOS 62 04/07/2014 1037   BILITOT 0.4 07/10/2014 0955   BILITOT 0.20 06/09/2014 1000   BILITOT 0.25 05/19/2014 0958   BILITOT 0.6 04/07/2014 1037   PROT 7.8 07/10/2014 0955   PROT 6.9 06/09/2014 1000   PROT 7.1 05/19/2014 0958   PROT 6.2 04/07/2014 1037   ALBUMIN 4.1 07/10/2014 0955   ALBUMIN 3.5 06/09/2014 1000   ALBUMIN 3.7 05/19/2014 0958   ALBUMIN 3.8 04/07/2014 1037     Studies/Results: No results found.  Anti-infectives: Anti-infectives    Start     Dose/Rate Route Frequency Ordered Stop   07/10/14 2200  cefoTEtan (CEFOTAN) 2 g in dextrose 5 % 50 mL IVPB     2 g100 mL/hr over 30 Minutes Intravenous Every 12 hours 07/10/14 1604 07/10/14 2252   07/10/14 0920  cefOXitin (MEFOXIN) 2 g in dextrose 5 % 50 mL IVPB     2 g100 mL/hr over 30 Minutes Intravenous On call to O.R.  07/10/14 0920 07/10/14 1332      Assessment S/p closure of colostomy 07/10/14-bowel function has returned  ABL anemia-stable   LOS: 5 days   Plan: Advance diet.  Oral analgesics.   Shiela Bruns J 07/15/2014

## 2014-07-15 NOTE — Plan of Care (Signed)
Problem: Phase II Progression Outcomes Goal: Progress activity as tolerated unless otherwise ordered Outcome: Completed/Met Date Met:  07/15/14     

## 2014-07-15 NOTE — Progress Notes (Signed)
The patient is receiving Protonix by the intravenous route.  Based on criteria approved by the Pharmacy and [place], the medication is being converted to the equivalent oral dose form.  These criteria include: -No Active GI bleeding -Able to tolerate diet of full liquids (or better) or tube feeding -Able to tolerate other medications by the oral or enteral route  If you have any questions about this conversion, please contact the Pharmacy Department (phone [phone]).  Thank you.  Minda Ditto PharmD Pager [phone] 07/15/2014, 2:23 PM

## 2014-07-15 NOTE — Plan of Care (Signed)
Problem: Phase II Progression Outcomes Goal: Tolerating diet Outcome: Completed/Met Date Met:  07/15/14

## 2014-07-15 NOTE — Plan of Care (Signed)
Problem: Phase II Progression Outcomes Goal: Return of bowel function (flatus, BM) IF ABDOMINAL SURGERY:  Outcome: Completed/Met Date Met:  07/15/14

## 2014-07-15 NOTE — Plan of Care (Signed)
Problem: Phase II Progression Outcomes Goal: Return of bowel function (flatus, BM) IF ABDOMINAL SURGERY:  Outcome: Progressing

## 2014-07-15 NOTE — Plan of Care (Signed)
Problem: Discharge Progression Outcomes Goal: Pain controlled with appropriate interventions Outcome: Progressing     

## 2014-07-15 NOTE — Progress Notes (Signed)
Pharmacy Brief Note - Alvimopan (Entereg)  The standing order set for alvimopan (Entereg) now includes an automatic order to discontinue the drug after the patient has had a bowel movement. The change was approved by the [place] and the Medical Executive Committee.   This patient has had bowel movements documented by nursing. Therefore, alvimopan has been discontinued. If there are questions, please contact the pharmacy at [phone].   Thank you- Minda Ditto PharmD Pager [phone] 07/15/2014, 2:19 PM

## 2014-07-15 NOTE — Plan of Care (Signed)
Problem: Discharge Progression Outcomes Goal: Tolerating diet Outcome: Completed/Met Date Met:  07/15/14

## 2014-07-16 MED ORDER — OXYCODONE HCL 5 MG PO TABS: 5.0000 mg | ORAL_TABLET | ORAL | Status: DC | PRN

## 2014-07-16 NOTE — Discharge Instructions (Signed)
[facility], [place] [phone]  OPEN ABDOMINAL SURGERY: POST OP INSTRUCTIONS  Always review your discharge instruction sheet given to you by the facility where your surgery was performed.  IF YOU HAVE DISABILITY OR FAMILY LEAVE FORMS, YOU MUST BRING THEM TO THE OFFICE FOR PROCESSING.  PLEASE DO NOT GIVE THEM TO YOUR DOCTOR.  1. A prescription for pain medication may be given to you upon discharge.  Take your pain medication as prescribed, if needed.  If narcotic pain medicine is not needed, then you may take acetaminophen (Tylenol) or ibuprofen (Advil) as needed. 2. Take your usually prescribed medications unless otherwise directed. 3. If you need a refill on your pain medication, please contact your pharmacy. They will contact our office to request authorization.  Prescriptions will not be filled after 5pm or on week-ends. 4. You should follow a high fiber, lowfat diet.   Be sure to include lots of fluids daily.  5. Most patients will experience some swelling and bruising in the area of the incision. Ice pack will help. Swelling and bruising can take several days to resolve..  6. It is common to experience some constipation if taking pain medication after surgery.  Increasing fluid intake and taking a stool softener will usually help or prevent this problem from occurring.  A mild laxative (Milk of Magnesia or Miralax) should be taken according to package directions if there are no bowel movements after 48 hours. 7.  You may have steri-strips (small skin tapes) in place directly over the incision.  These strips should be left on the skin.  If your surgeon used skin glue on the incision, you may shower in 24 hours.  The glue will flake off over the next 2-3 weeks.  Any sutures or staples will be removed at the office during your follow-up visit. You may find that a light gauze bandage over your incision may keep your staples from being rubbed or pulled. You may shower and replace  the bandage daily. 8. ACTIVITIES:  You may resume regular (light) daily activities beginning the next day--such as daily self-care, walking, climbing stairs--gradually increasing activities as tolerated.  You may have sexual intercourse when it is comfortable.  Refrain from any heavy lifting or straining-nothing over 10 pounds for 6 weeks.  a. You may drive when you no longer are taking prescription pain medication, you can comfortably wear a seatbelt, and you can safely maneuver your car and apply brakes b. Return to Work: ___________________________________ 23. You should see your doctor in the office for a follow-up appointment approximately two weeks after your surgery.  Make sure that you call for this appointment within a day or two after you arrive home to insure a convenient appointment time. OTHER INSTRUCTIONS:  Check your dressings once a day as discussed.  May start showering tomorrow._____________________________________________________________ _____________________________________________________________  WHEN TO CALL YOUR DOCTOR: 1. Fever over 101.0 2. Inability to urinate 3. Nausea and/or vomiting 4. Extreme swelling or bruising 5. Continued bleeding from incision. 6. Increased pain, redness, or drainage from the incision.  The clinic staff is available to answer your questions during regular business hours.  Please dont hesitate to call and ask to speak to one of the nurses if you have concerns.  For further questions, please visit [email or web address]

## 2014-07-16 NOTE — Progress Notes (Signed)
6 Days Post-Op  Subjective: Tolerating diet.  No fever.  Bowels moving. Walking in the halls.  Objective: Vital signs in last 24 hours: Temp:  [98 F (36.7 C)-98.8 F (37.1 C)] 98.1 F (36.7 C) (11/18 0600) Pulse Rate:  [66-90] 90 (11/18 0600) Resp:  [16-18] 18 (11/18 0600) BP: (126-139)/(72-73) 126/72 mmHg (11/18 0600) SpO2:  [97 %-98 %] 98 % (11/18 0600) Last BM Date: 07/15/14  Intake/Output from previous day: 11/17 0701 - 11/18 0700 In: 714.3 [P.O.:340; I.V.:374.3] Out: 670 [Urine:650; Drains:20] Intake/Output this shift:    PE: General- In NAD Abdomen-soft, incisions are clean and intact, active bowel sounds, serous drain output, left side open wound clean  Lab Results:  No results for input(s): WBC, HGB, HCT, PLT in the last 72 hours. BMET No results for input(s): NA, K, CL, CO2, GLUCOSE, BUN, CREATININE, CALCIUM in the last 72 hours. PT/INR No results for input(s): LABPROT, INR in the last 72 hours. Comprehensive Metabolic Panel:    Component Value Date/Time   NA 139 07/12/2014 0512   NA 141 07/11/2014 0436   NA 142 06/09/2014 1000   NA 142 05/19/2014 0958   K 3.9 07/12/2014 0512   K 4.5 07/11/2014 0436   K 3.7 06/09/2014 1000   K 4.1 05/19/2014 0958   CL 100 07/12/2014 0512   CL 102 07/11/2014 0436   CO2 32 07/12/2014 0512   CO2 28 07/11/2014 0436   CO2 26 06/09/2014 1000   CO2 25 05/19/2014 0958   BUN 5* 07/12/2014 0512   BUN 8 07/11/2014 0436   BUN 10.5 06/09/2014 1000   BUN 14.8 05/19/2014 0958   CREATININE 0.55 07/12/2014 0512   CREATININE 0.61 07/11/2014 0436   CREATININE 0.7 06/09/2014 1000   CREATININE 0.7 05/19/2014 0958   GLUCOSE 127* 07/12/2014 0512   GLUCOSE 142* 07/11/2014 0436   GLUCOSE 124 06/09/2014 1000   GLUCOSE 122 05/19/2014 0958   CALCIUM 9.0 07/12/2014 0512   CALCIUM 9.2 07/11/2014 0436   CALCIUM 9.6 06/09/2014 1000   CALCIUM 9.6 05/19/2014 0958   AST 15 07/10/2014 0955   AST 11 06/09/2014 1000   AST 10 05/19/2014 0958   AST 11 04/07/2014 1037   ALT 8 07/10/2014 0955   ALT 9 06/09/2014 1000   ALT 7 05/19/2014 0958   ALT 12 04/07/2014 1037   ALKPHOS 91 07/10/2014 0955   ALKPHOS 71 06/09/2014 1000   ALKPHOS 69 05/19/2014 0958   ALKPHOS 62 04/07/2014 1037   BILITOT 0.4 07/10/2014 0955   BILITOT 0.20 06/09/2014 1000   BILITOT 0.25 05/19/2014 0958   BILITOT 0.6 04/07/2014 1037   PROT 7.8 07/10/2014 0955   PROT 6.9 06/09/2014 1000   PROT 7.1 05/19/2014 0958   PROT 6.2 04/07/2014 1037   ALBUMIN 4.1 07/10/2014 0955   ALBUMIN 3.5 06/09/2014 1000   ALBUMIN 3.7 05/19/2014 0958   ALBUMIN 3.8 04/07/2014 1037     Studies/Results: No results found.  Anti-infectives: Anti-infectives    Start     Dose/Rate Route Frequency Ordered Stop   07/10/14 2200  cefoTEtan (CEFOTAN) 2 g in dextrose 5 % 50 mL IVPB     2 g100 mL/hr over 30 Minutes Intravenous Every 12 hours 07/10/14 1604 07/10/14 2252   07/10/14 0920  cefOXitin (MEFOXIN) 2 g in dextrose 5 % 50 mL IVPB     2 g100 mL/hr over 30 Minutes Intravenous On call to O.R. 07/10/14 0920 07/10/14 1332      Assessment S/p closure of colostomy  07/10/14-progressing well  ABL anemia-stable   LOS: 6 days   Plan: D/C drain-done.  D/C staples-done.  Discharge.  Instructions given.   Alexis [name] 07/16/2014

## 2014-07-17 ENCOUNTER — Telehealth: Payer: Self-pay | Admitting: Medical Oncology

## 2014-07-17 NOTE — Telephone Encounter (Signed)
I told Alexis [name] to keep her appointment on 11/30.

## 2014-07-28 ENCOUNTER — Encounter: Payer: Self-pay | Admitting: Internal Medicine

## 2014-07-28 ENCOUNTER — Other Ambulatory Visit (HOSPITAL_BASED_OUTPATIENT_CLINIC_OR_DEPARTMENT_OTHER): Payer: BC Managed Care – PPO

## 2014-07-28 ENCOUNTER — Ambulatory Visit (HOSPITAL_BASED_OUTPATIENT_CLINIC_OR_DEPARTMENT_OTHER): Payer: BC Managed Care – PPO | Admitting: Internal Medicine

## 2014-07-28 ENCOUNTER — Ambulatory Visit (HOSPITAL_BASED_OUTPATIENT_CLINIC_OR_DEPARTMENT_OTHER): Payer: BC Managed Care – PPO

## 2014-07-28 ENCOUNTER — Telehealth: Payer: Self-pay | Admitting: Internal Medicine

## 2014-07-28 VITALS — BP 112/59 | HR 70 | Temp 98.1°F | Resp 18 | Ht 64.0 in | Wt 120.5 lb

## 2014-07-28 DIAGNOSIS — C7931 Secondary malignant neoplasm of brain: Secondary | ICD-10-CM

## 2014-07-28 DIAGNOSIS — C3410 Malignant neoplasm of upper lobe, unspecified bronchus or lung: Secondary | ICD-10-CM

## 2014-07-28 DIAGNOSIS — C3411 Malignant neoplasm of upper lobe, right bronchus or lung: Secondary | ICD-10-CM

## 2014-07-28 DIAGNOSIS — Z5111 Encounter for antineoplastic chemotherapy: Secondary | ICD-10-CM

## 2014-07-28 LAB — COMPREHENSIVE METABOLIC PANEL (CC13)
ALT: <6 U/L (ref 0–55)
AST: 10 U/L (ref 5–34)
Albumin: 3.7 g/dL (ref 3.5–5.0)
Alkaline Phosphatase: 68 U/L (ref 40–150)
Anion Gap: 12 mEq/L — ABNORMAL HIGH (ref 3–11)
BUN: 12.4 mg/dL (ref 7.0–26.0)
CO2: 27 mEq/L (ref 22–29)
Calcium: 10.1 mg/dL (ref 8.4–10.4)
Chloride: 103 mEq/L (ref 98–109)
Creatinine: 0.8 mg/dL (ref 0.6–1.1)
Glucose: 131 mg/dl (ref 70–140)
Potassium: 4.2 mEq/L (ref 3.5–5.1)
Sodium: 141 mEq/L (ref 136–145)
Total Bilirubin: 0.38 mg/dL (ref 0.20–1.20)
Total Protein: 7.2 g/dL (ref 6.4–8.3)

## 2014-07-28 LAB — CBC WITH DIFFERENTIAL/PLATELET
BASO%: 0 % (ref 0.0–2.0)
Basophils Absolute: 0 10*3/uL (ref 0.0–0.1)
EOS%: 0 % (ref 0.0–7.0)
Eosinophils Absolute: 0 10*3/uL (ref 0.0–0.5)
HCT: 37.2 % (ref 34.8–46.6)
HGB: 12.1 g/dL (ref 11.6–15.9)
LYMPH%: 3.9 % — ABNORMAL LOW (ref 14.0–49.7)
MCH: 32.4 pg (ref 25.1–34.0)
MCHC: 32.5 g/dL (ref 31.5–36.0)
MCV: 99.7 fL (ref 79.5–101.0)
MONO#: 0.6 10*3/uL (ref 0.1–0.9)
MONO%: 7.5 % (ref 0.0–14.0)
NEUT#: 7.3 10*3/uL — ABNORMAL HIGH (ref 1.5–6.5)
NEUT%: 88.6 % — ABNORMAL HIGH (ref 38.4–76.8)
Platelets: 370 10*3/uL (ref 145–400)
RBC: 3.73 10*6/uL (ref 3.70–5.45)
RDW: 13.1 % (ref 11.2–14.5)
WBC: 8.3 10*3/uL (ref 3.9–10.3)
lymph#: 0.3 10*3/uL — ABNORMAL LOW (ref 0.9–3.3)

## 2014-07-28 MED ORDER — ONDANSETRON 8 MG/NS 50 ML IVPB
INTRAVENOUS | Status: AC
  Filled 2014-07-28: qty 8

## 2014-07-28 MED ORDER — SODIUM CHLORIDE 0.9 % IV SOLN
500.0000 mg/m2 | Freq: Once | INTRAVENOUS | Status: AC
  Administered 2014-07-28: 800 mg via INTRAVENOUS
  Filled 2014-07-28: qty 32

## 2014-07-28 MED ORDER — DEXAMETHASONE SODIUM PHOSPHATE 10 MG/ML IJ SOLN
10.0000 mg | Freq: Once | INTRAMUSCULAR | Status: AC
  Administered 2014-07-28: 10 mg via INTRAVENOUS

## 2014-07-28 MED ORDER — ALPRAZOLAM 0.25 MG PO TABS: 0.2500 mg | ORAL_TABLET | Freq: Three times a day (TID) | ORAL | Status: DC | PRN

## 2014-07-28 MED ORDER — DEXAMETHASONE SODIUM PHOSPHATE 10 MG/ML IJ SOLN
INTRAMUSCULAR | Status: AC
  Filled 2014-07-28: qty 1

## 2014-07-28 MED ORDER — CYANOCOBALAMIN 1000 MCG/ML IJ SOLN
INTRAMUSCULAR | Status: AC
  Filled 2014-07-28: qty 1

## 2014-07-28 MED ORDER — CYANOCOBALAMIN 1000 MCG/ML IJ SOLN
1000.0000 ug | Freq: Once | INTRAMUSCULAR | Status: AC
  Administered 2014-07-28: 1000 ug via INTRAMUSCULAR

## 2014-07-28 MED ORDER — SODIUM CHLORIDE 0.9 % IV SOLN
Freq: Once | INTRAVENOUS | Status: AC
  Administered 2014-07-28: 11:00:00 via INTRAVENOUS

## 2014-07-28 MED ORDER — ONDANSETRON 8 MG/50ML IVPB (CHCC)
8.0000 mg | Freq: Once | INTRAVENOUS | Status: AC
  Administered 2014-07-28: 8 mg via INTRAVENOUS

## 2014-07-28 NOTE — Progress Notes (Signed)
[facility] [phone]   Fax:([phone]) [place], [place] [address] [place] [address]  DIAGNOSIS: Stage IV (T2a, N0, M1b) non-small cell lung cancer consistent with adenocarcinoma with negative EGFR mutation and negative ALK gene translocation diagnosed in January of 2015 presented with right upper lobe lung mass in addition to a solitary brain metastasis status post stereotactic to a solitary brain metastasis as well as radiotherapy to the right upper lobe lung mass.  PRIOR THERAPY:  1) Status post stereotactic radiotherapy to a solitary right parietal brain lesion under the care of Dr. [name] on 10/16/2013. 2) Status post palliative radiotherapy to the right lung tumor under the care of Dr. [name] completed on 12/05/2013. 3) Systemic chemotherapy with carboplatin for AUC of 5 and Alimta 500 mg/M2 every 3 weeks. First dose Jan 06 2014. Status post 6 cycles.  CURRENT THERAPY: Systemic chemotherapy with maintenance Alimta 500 mg/M2 every 3 weeks. Status post 2 cycle.  INTERVAL HISTORY: Alexis [name] 63 y.o. female returns to the clinic today for followup visit accompanied by her husband. The patient tolerated the first 2 cycles of her maintenance chemotherapy with Alimta fairly well. She has been off treatment for the last few weeks to undergo surgical reversal of the colostomy under the care of Dr. Zella Richer on 07/10/2014. She is recovering well after her surgery. She denied having any significant chest pain, shortness of breath, cough or hemoptysis. She has no fever or chills, no nausea or vomiting. She denied having any significant weight loss or night sweats. She is here today to resume her chemotherapy. Her aspirin was concern about her anxiety and would like her to be started on some treatment for this condition.  MEDICAL HISTORY: Past Medical History  Diagnosis Date  . Ileus, postoperative 11/18/2013  . Physical  deconditioning 11/18/2013  . Severe protein-calorie malnutrition 11/18/2013  . Anemia 11/18/2013  . History of radiation therapy 10-28-13- 12-05-13    lung ca 50 Gy/72f  . Anxiety   . 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  MEDICATIONS:  Current Outpatient Prescriptions  Medication Sig Dispense Refill  . acetaminophen (TYLENOL) 500 MG tablet Take 500 mg by mouth every 6 (six) hours as needed for mild pain or headache.    . Ascorbic Acid (VITAMIN C GUMMIE PO) Take 1 each by mouth every morning.    .Marland Kitchendexamethasone (DECADRON) 4 MG tablet 4 mg by mouth twice a day the day before, day of and day after the chemotherapy every 3 weeks 40 tablet 1  . Multiple Vitamin (MULTIVITAMIN WITH MINERALS) TABS tablet Take 1 tablet by mouth every morning.    . ondansetron (ZOFRAN) 8 MG tablet Take 1 tablet (8 mg total) by mouth every 8 (eight) hours as needed for nausea or vomiting. 30 tablet 0  . OVER THE COUNTER MEDICATION Take 1 tablet by mouth every morning. (Vitamin A)    . oxyCODONE (OXY IR/ROXICODONE) 5 MG immediate release tablet Take 1-2 tablets (5-10 mg total) by mouth every 4 (four) hours as needed for moderate pain. 40 tablet 0  . prochlorperazine (COMPAZINE) 10 MG tablet Take 1 tablet (10 mg total) by mouth every 6 (six) hours as needed for nausea or vomiting. 60 tablet 0  . Zinc 50 MG TABS Take 1 tablet by mouth every morning.  No current facility-administered medications for this visit.    SURGICAL HISTORY:  Past Surgical History  Procedure Laterality Date  . Video bronchoscopy Bilateral 08/30/2013    Procedure: VIDEO BRONCHOSCOPY WITH FLUORO;  Surgeon: Tanda Rockers, MD;  Location: [facility];  Service: Cardiopulmonary;  Laterality: Bilateral;  . Abdominal hysterectomy    . Laparotomy N/A 11/03/2013    Procedure: EXPLORATORY LAPAROTOMY, DRAINAGE OF INTRA  ABDOMINAL ABSCESSES, MOBILIZATION OF SPLENIC FLEXURE,  SIGMOID COLECTOMY WITH COLOSTOMY;  Surgeon: Odis Hollingshead, MD;  Location: [facility];  Service: General;  Laterality: N/A;  . Colostomy takedown N/A 07/10/2014    Procedure: LAPAROSCOPIC LYSIS OF ADHESIONS (90 MIN) LAPAROSCOPIC ASSISTED COLOSTOMY CLOSURE, RIGID PROCTOSIGMOIDOSCOPY;  Surgeon: Jackolyn Confer, MD;  Location: [facility];  Service: General;  Laterality: N/A;    REVIEW OF SYSTEMS:  Constitutional: positive for fatigue Eyes: negative Ears, nose, mouth, throat, and face: negative Respiratory: negative Cardiovascular: negative Gastrointestinal: negative Genitourinary:negative Integument/breast: negative Hematologic/lymphatic: negative Musculoskeletal:negative Neurological: positive for weakness Behavioral/Psych: positive for depression Endocrine: negative Allergic/Immunologic: negative   PHYSICAL EXAMINATION: General appearance: alert, cooperative, fatigued and no distress Head: Normocephalic, without obvious abnormality, atraumatic Neck: no adenopathy, no JVD, supple, symmetrical, trachea midline and thyroid not enlarged, symmetric, no tenderness/mass/nodules Lymph nodes: Cervical, supraclavicular, and axillary nodes normal. Resp: clear to auscultation bilaterally Back: symmetric, no curvature. ROM normal. No CVA tenderness. Cardio: regular rate and rhythm, S1, S2 normal, no murmur, click, rub or gallop GI: soft, non-tender; bowel sounds normal; no masses,  no organomegaly Extremities: extremities normal, atraumatic, no cyanosis or edema Neurologic: Alert and oriented X 3, normal strength and tone. Normal symmetric reflexes. Normal coordination and gait  ECOG PERFORMANCE STATUS: 2 - Symptomatic, <50% confined to bed  Blood pressure 112/59, pulse 70, temperature 98.1 F (36.7 C), temperature source Oral, resp. rate 18, height _0  (1.626 m), weight 120 lb 8 oz (54.658 kg).  LABORATORY DATA: Lab Results  Component Value Date   WBC 8.3 07/28/2014   HGB 12.1 07/28/2014    HCT 37.2 07/28/2014   MCV 99.7 07/28/2014   PLT 370 07/28/2014      Chemistry      Component Value Date/Time   NA 141 07/28/2014 0924   NA 139 07/12/2014 0512   K 4.2 07/28/2014 0924   K 3.9 07/12/2014 0512   CL 100 07/12/2014 0512   CO2 27 07/28/2014 0924   CO2 32 07/12/2014 0512   BUN 12.4 07/28/2014 0924   BUN 5* 07/12/2014 0512   CREATININE 0.8 07/28/2014 0924   CREATININE 0.55 07/12/2014 0512      Component Value Date/Time   CALCIUM 10.1 07/28/2014 0924   CALCIUM 9.0 07/12/2014 0512   ALKPHOS 68 07/28/2014 0924   ALKPHOS 91 07/10/2014 0955   AST 10 07/28/2014 0924   AST 15 07/10/2014 0955   ALT <6 07/28/2014 0924   ALT 8 07/10/2014 0955   BILITOT 0.38 07/28/2014 0924   BILITOT 0.4 07/10/2014 0955       RADIOGRAPHIC STUDIES: No results found.  ASSESSMENT AND PLAN: This is a very pleasant 63 years old white female recently diagnosed with a stage IV non-small cell lung cancer, adenocarcinoma with negative EGFR mutation and negative ALK gene translocation. The patient is status post stereotactic radiotherapy to a solitary brain metastases in addition to palliative radiotherapy to the right upper lobe lung mass. She status post 6 cycles of systemic chemotherapy with carboplatin and Alimta with improvement in her disease and she is currently undergoing maintenance  chemotherapy with single agent Alimta status post 2 cycles and tolerating her treatment fairly well. She successfully underwent reversal of colostomy. We will resume her maintenance chemotherapy with single agent Alimta. The patient will receive cycle #3 today. She will come back for follow-up visit in 3 weeks after repeating CT scan of the chest, abdomen and pelvis for restaging of her disease. For history of depression, the patient on Remeron 30 mg by mouth each bedtime. For anxiety, I started the patient on Xanax 0.25 mg by mouth 3 times a day as needed. The patient was advised to call immediately if she has  any concerning symptoms in the interval. The patient voices understanding of current disease status and treatment options and is in agreement with the current care plan.  All questions were answered. The patient knows to call the clinic with any problems, questions or concerns. We can certainly see the patient much sooner if necessary.  Disclaimer: This note was dictated with voice recognition software. Similar sounding words can inadvertently be transcribed and may not be corrected upon review.

## 2014-07-28 NOTE — Patient Instructions (Signed)
[facility] Discharge Instructions for Patients Receiving Chemotherapy  Today you received the following chemotherapy agents Alimta.  To help prevent nausea and vomiting after your treatment, we encourage you to take your nausea medication as directed.   If you develop nausea and vomiting that is not controlled by your nausea medication, call the clinic.   BELOW ARE SYMPTOMS THAT SHOULD BE REPORTED IMMEDIATELY:  *FEVER GREATER THAN 100.5 F  *CHILLS WITH OR WITHOUT FEVER  NAUSEA AND VOMITING THAT IS NOT CONTROLLED WITH YOUR NAUSEA MEDICATION  *UNUSUAL SHORTNESS OF BREATH  *UNUSUAL BRUISING OR BLEEDING  TENDERNESS IN MOUTH AND THROAT WITH OR WITHOUT PRESENCE OF ULCERS  *URINARY PROBLEMS  *BOWEL PROBLEMS  UNUSUAL RASH Items with * indicate a potential emergency and should be followed up as soon as possible.  Feel free to call the clinic you have any questions or concerns. The clinic phone number is [phone].

## 2014-07-28 NOTE — Discharge Summary (Signed)
Physician Discharge Summary  Patient ID: Alexis [name] MRN: [id number] DOB/AGE: 03/31/1951 63 y.o.  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  Her colostomy site wound was left open to heal by secondary intention using wet to dry dressing changes.  She had a postop ileus that eventually resolved and she was able to be discharged on POD #6.  Discharge instructions were given to her.  Consults: None  Significant Diagnostic Studies: none  Discharge Exam: Blood pressure 126/72, pulse 90, temperature 98.1 F (36.7 C), temperature source Oral, resp. rate 18, height 5\' 4"  (1.626 m), weight 127 lb 13.9 oz (58 kg), SpO2 98 %.   Disposition: 01-Home or Self Care     Medication List    TAKE these medications        acetaminophen 500 MG tablet  Commonly known as:  TYLENOL  Take 500 mg by mouth every 6 (six) hours as needed for mild pain or headache.     dexamethasone 4 MG tablet  Commonly known as:  DECADRON  4 mg by mouth twice a day the day before, day of and day after the chemotherapy every 3 weeks     multivitamin with minerals Tabs tablet  Take 1 tablet by mouth every morning.     ondansetron 8 MG tablet  Commonly known as:  ZOFRAN  Take 1 tablet (8 mg total) by mouth every 8 (eight) hours as needed for nausea or vomiting.     OVER THE COUNTER MEDICATION  Take 1 tablet by mouth every morning. (Vitamin A)     oxyCODONE 5 MG immediate release tablet  Commonly known as:  Oxy IR/ROXICODONE  Take 1-2 tablets (5-10 mg total) by mouth every 4 (four) hours as needed for moderate pain.     prochlorperazine 10 MG tablet  Commonly known as:  COMPAZINE  Take 1 tablet (10 mg total) by mouth every 6 (six) hours as needed for nausea or vomiting.      VITAMIN C GUMMIE PO  Take 1 each by mouth every morning.     Zinc 50 MG Tabs  Take 1 tablet by mouth every morning.         Signed: Odis Hollingshead 07/28/2014, 10:56 AM

## 2014-07-30 ENCOUNTER — Encounter (INDEPENDENT_AMBULATORY_CARE_PROVIDER_SITE_OTHER): Payer: Self-pay | Admitting: General Surgery

## 2014-07-30 ENCOUNTER — Ambulatory Visit
Admission: RE | Admit: 2014-07-30 | Discharge: 2014-07-30 | Disposition: A | Discharge status: Home / Self Care | Payer: BC Managed Care – PPO | Source: Ambulatory Visit | Attending: Radiation Oncology | Admitting: Radiation Oncology

## 2014-07-30 DIAGNOSIS — C7931 Secondary malignant neoplasm of brain: Secondary | ICD-10-CM

## 2014-07-30 DIAGNOSIS — C7949 Secondary malignant neoplasm of other parts of nervous system: Principal | ICD-10-CM

## 2014-07-30 MED ORDER — GADOBENATE DIMEGLUMINE 529 MG/ML IV SOLN
11.0000 mL | Freq: Once | INTRAVENOUS | Status: AC | PRN
  Administered 2014-07-30: 11 mL via INTRAVENOUS

## 2014-07-30 NOTE — Progress Notes (Signed)
Patient ID: BARRIE SIGMUND, female   DOB: Jun 27, 1951, 63 y.o.   MRN: [id number]  rances L. Gorley 07/30/2014 9:01 AM Location: [facility] Patient #: [id number] DOB: 1951-02-10 Married / Language: English / Race: White Female History of Present Illness Odis Hollingshead MD; 07/30/2014 9:21 AM) The patient is a 63 year old female    Note:She is here with her husband for her first postoperative visit following laparoscopic assisted colostomy closure July 09, 2014. She is eating some. Bowels are moving. No fever. She stays cold. She has restarted her chemotherapy. Still having incisional soreness.  Allergies Jeralyn Ruths, [place]; 07/30/2014 9:04 AM) Codeine/Codeine Derivatives  Medication History Jeralyn Ruths, CMA; 07/30/2014 9:03 AM) Medications Reconciled    Vitals Jearld Fenton Morris CMA; 07/30/2014 9:02 AM) 07/30/2014 9:01 AM Weight: 122.4 lb Height: 64.5in Body Surface Area: 1.59 m Body Mass Index: 20.69 kg/m Pulse: 68 (Regular)  Resp.: 18 (Unlabored)  BP: 110/68 (Sitting, Left Arm, Standard)     Physical Exam Odis Hollingshead MD; 07/30/2014 9:22 AM)  The physical exam findings are as follows: Note:General-thin female in no acute distress.  Abdomen-limited lower midline incision is clean and intact. Colostomy site demonstrates pink granulation tissue healing by secondary intention.    Assessment & Plan Odis Hollingshead MD; 07/30/2014 9:22 AM)  POST-OPERATIVE STATE [phone]) Impression: Making slow but steady progress postoperatively. No complications noted.  Plan: Try 6 small meals a day as she is getting full fast now. Continue current dressing changes and activity restrictions. Return visit one month.  Current Plans Follow up in 1 month or as needed Free Text Instructions : discussed with patient and provided information. Started OxyCODONE HCl 5MG , 1 (one) Tablet every four hours, as needed, #30, 07/30/2014, No Refill.  Jackolyn Confer, MD

## 2014-07-31 ENCOUNTER — Encounter: Payer: Self-pay | Admitting: Radiation Oncology

## 2014-08-01 ENCOUNTER — Other Ambulatory Visit: Payer: BC Managed Care – PPO

## 2014-08-04 ENCOUNTER — Ambulatory Visit
Admission: RE | Admit: 2014-08-04 | Discharge: 2014-08-04 | Disposition: A | Discharge status: Home / Self Care | Payer: BC Managed Care – PPO | Source: Ambulatory Visit | Attending: Radiation Oncology | Admitting: Radiation Oncology

## 2014-08-04 ENCOUNTER — Encounter (HOSPITAL_COMMUNITY): Payer: Self-pay | Admitting: General Surgery

## 2014-08-04 VITALS — BP 104/74 | HR 92 | Temp 97.9°F | Resp 20 | Ht 64.0 in | Wt 120.3 lb

## 2014-08-04 DIAGNOSIS — C7949 Secondary malignant neoplasm of other parts of nervous system: Principal | ICD-10-CM

## 2014-08-04 DIAGNOSIS — C7931 Secondary malignant neoplasm of brain: Secondary | ICD-10-CM

## 2014-08-04 NOTE — Progress Notes (Signed)
Radiation Oncology         [phone] ________________________________  Name: Alexis [name] MRN: [id number]  Date: 08/04/2014  DOB: 20-Nov-1950  Follow-Up Visit Note  CC: Alexis Roers, MD  Alexis Roers, MD  Diagnosis:   Metastatic non-small cell lung cancer with a solitary brain metastasis  Interval Since Last Radiation:  10 months   Narrative:  The patient returns today for routine follow-up.  The patient states she is doing well overall. She is very pleased that her colostomy has been reversed last month. She continues on maintenance chemotherapy and she states that this is going well. No significant new complaints. She does have repeat CT imaging later this month with a follow-up with medical oncology. She underwent a recent MRI scan of the brain which was reviewed in brain conference this morning.                            ALLERGIES:  is allergic to codeine.  Meds: Current Outpatient Prescriptions  Medication Sig Dispense Refill  . acetaminophen (TYLENOL) 500 MG tablet Take 500 mg by mouth every 6 (six) hours as needed for mild pain or headache.    . ALPRAZolam (XANAX) 0.25 MG tablet Take 1 tablet (0.25 mg total) by mouth 3 (three) times daily as needed for anxiety. 45 tablet 0  . Ascorbic Acid (VITAMIN C GUMMIE PO) Take 1 each by mouth every morning.    Marland Kitchen dexamethasone (DECADRON) 4 MG tablet 4 mg by mouth twice a day the day before, day of and day after the chemotherapy every 3 weeks 40 tablet 1  . Multiple Vitamin (MULTIVITAMIN WITH MINERALS) TABS tablet Take 1 tablet by mouth every morning.    . ondansetron (ZOFRAN) 8 MG tablet Take 1 tablet (8 mg total) by mouth every 8 (eight) hours as needed for nausea or vomiting. 30 tablet 0  . OVER THE COUNTER MEDICATION Take 1 tablet by mouth every morning. (Vitamin A)    . oxyCODONE (OXY IR/ROXICODONE) 5 MG immediate release tablet Take 1-2 tablets (5-10 mg total) by mouth every 4 (four) hours as needed for moderate pain. 40 tablet 0    . prochlorperazine (COMPAZINE) 10 MG tablet Take 1 tablet (10 mg total) by mouth every 6 (six) hours as needed for nausea or vomiting. 60 tablet 0  . Zinc 50 MG TABS Take 1 tablet by mouth every morning.     No current facility-administered medications for this encounter.    Physical Findings: The patient is in no acute distress. Patient is alert and oriented.  height is 5\' 4"  (1.626 m) and weight is 120 lb 4.8 oz (54.568 kg). Her oral temperature is 97.9 F (36.6 C). Her blood pressure is 104/74 and her pulse is 92. Her respiration is 20. Marland Kitchen   General: Well-developed, in no acute distress HEENT: Normocephalic, atraumatic    Lab Findings: Lab Results  Component Value Date   WBC 8.3 07/28/2014   HGB 12.1 07/28/2014   HCT 37.2 07/28/2014   MCV 99.7 07/28/2014   PLT 370 07/28/2014     Radiographic Findings: Mr [name] XT Contrast  01/17/2014   CLINICAL DATA:  63 year old female with metastatic lung cancer status post SRS treatment of brain metastasis. Restaging. Subsequent encounter.  EXAM: MRI HEAD WITHOUT AND WITH CONTRAST  TECHNIQUE: Multiplanar, multiecho pulse sequences of the brain and surrounding structures were obtained without and with intravenous contrast.  CONTRAST:  52mL MULTIHANCE GADOBENATE DIMEGLUMINE  529 MG/ML IV SOLN  COMPARISON:  Pretreatment study 10/08/2013.  FINDINGS: Right parietal lobe metastasis re- identified and stable in overall size. The lesion shows less central enhancement today (series 10, image 106 versus series 11, image 124 previously).  No new or additional brain metastasis identified.  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  Decreased mild cerebral edema surrounding the right parietal lesion. Stable nonspecific T2 and FLAIR hyperintensity elsewhere in the cerebral white matter. Major intracranial vascular flow voids are stable. No restricted diffusion or evidence of acute infarction. Probable  small left parietal scalp convexity sebaceous cyst is unchanged. No acute intracranial hemorrhage identified. No ventriculomegaly. No intracranial mass effect. Negative pituitary and cervicomedullary junction. Grossly negative visualized cervical spine. Stable bone marrow signal. Visualized orbit soft tissues are within normal limits. Visible internal auditory structures appear normal. Stable minor paranasal sinus mucosal thickening. Mastoids are clear. Visualized scalp soft tissues are within normal limits.  IMPRESSION: 1. Stable treated small, solitary right parietal lobe brain metastasis. Associated mild cerebral edema has decreased. 2. No new brain metastasis or new intracranial abnormality identified.   Electronically Signed   By: Lars Pinks M.D.   On: 01/17/2014 14:18    Impression:    The patient is doing well at this time clinically and is proceeding with maintenance chemotherapy. The patient's MRI scan has been personally reviewed and this looked good. The right parietal lobe lesion was stable with no new intracranial metastases.  Plan:  The patient will undergo a repeat MRI scan of the brain in 3 months.   AllI spent 10 minutes with the patient today, the majority of which was spent counseling the patient on the diagnosis of cancer and coordinating care.  Jodelle Gross, M.D., Ph.D.

## 2014-08-04 NOTE — Addendum Note (Signed)
Addendum  created 08/04/14 0712 by Nickie Retort, MD   Modules edited: Anesthesia Events

## 2014-08-04 NOTE — Progress Notes (Signed)
Follow up s/p rad txs here fro MRI results Brain done 07/30/14, patient weak and tender, had colostomy reversed  07/10/14 , appetite poor, on maintenance chemotherapy,  Eating 6 smaller meals(grazing),  Drinking enough water stated, having regular bowel movents 8:38 AM

## 2014-08-15 ENCOUNTER — Ambulatory Visit (HOSPITAL_COMMUNITY): Payer: BC Managed Care – PPO

## 2014-08-15 ENCOUNTER — Encounter (HOSPITAL_COMMUNITY): Payer: Self-pay

## 2014-08-15 ENCOUNTER — Ambulatory Visit (HOSPITAL_COMMUNITY)
Admission: RE | Admit: 2014-08-15 | Discharge: 2014-08-15 | Disposition: A | Discharge status: Home / Self Care | Payer: BC Managed Care – PPO | Source: Ambulatory Visit | Attending: Internal Medicine | Admitting: Internal Medicine

## 2014-08-15 ENCOUNTER — Other Ambulatory Visit: Payer: Self-pay | Admitting: Internal Medicine

## 2014-08-15 DIAGNOSIS — I7090 Unspecified atherosclerosis: Secondary | ICD-10-CM | POA: Insufficient documentation

## 2014-08-15 DIAGNOSIS — R911 Solitary pulmonary nodule: Secondary | ICD-10-CM | POA: Diagnosis not present

## 2014-08-15 DIAGNOSIS — Z9049 Acquired absence of other specified parts of digestive tract: Secondary | ICD-10-CM | POA: Diagnosis not present

## 2014-08-15 DIAGNOSIS — I700 Atherosclerosis of aorta: Secondary | ICD-10-CM | POA: Insufficient documentation

## 2014-08-15 DIAGNOSIS — Z923 Personal history of irradiation: Secondary | ICD-10-CM | POA: Diagnosis not present

## 2014-08-15 DIAGNOSIS — Z90710 Acquired absence of both cervix and uterus: Secondary | ICD-10-CM | POA: Diagnosis not present

## 2014-08-15 DIAGNOSIS — C3490 Malignant neoplasm of unspecified part of unspecified bronchus or lung: Secondary | ICD-10-CM | POA: Diagnosis present

## 2014-08-15 DIAGNOSIS — C3411 Malignant neoplasm of upper lobe, right bronchus or lung: Secondary | ICD-10-CM

## 2014-08-15 MED ORDER — IOHEXOL 300 MG/ML  SOLN
100.0000 mL | Freq: Once | INTRAMUSCULAR | Status: AC | PRN
  Administered 2014-08-15: 100 mL via INTRAVENOUS

## 2014-08-18 ENCOUNTER — Encounter: Payer: Self-pay | Admitting: *Deleted

## 2014-08-18 ENCOUNTER — Ambulatory Visit (HOSPITAL_BASED_OUTPATIENT_CLINIC_OR_DEPARTMENT_OTHER): Payer: BC Managed Care – PPO | Admitting: Internal Medicine

## 2014-08-18 ENCOUNTER — Ambulatory Visit (HOSPITAL_BASED_OUTPATIENT_CLINIC_OR_DEPARTMENT_OTHER): Payer: BC Managed Care – PPO | Admitting: Lab

## 2014-08-18 ENCOUNTER — Ambulatory Visit (HOSPITAL_BASED_OUTPATIENT_CLINIC_OR_DEPARTMENT_OTHER): Payer: BC Managed Care – PPO

## 2014-08-18 ENCOUNTER — Telehealth: Payer: Self-pay | Admitting: Internal Medicine

## 2014-08-18 ENCOUNTER — Encounter: Payer: Self-pay | Admitting: Internal Medicine

## 2014-08-18 VITALS — BP 112/53 | HR 77 | Temp 98.2°F | Resp 19 | Ht 64.0 in | Wt 123.8 lb

## 2014-08-18 DIAGNOSIS — C3411 Malignant neoplasm of upper lobe, right bronchus or lung: Secondary | ICD-10-CM

## 2014-08-18 DIAGNOSIS — C7931 Secondary malignant neoplasm of brain: Secondary | ICD-10-CM

## 2014-08-18 DIAGNOSIS — Z5111 Encounter for antineoplastic chemotherapy: Secondary | ICD-10-CM

## 2014-08-18 LAB — COMPREHENSIVE METABOLIC PANEL (CC13)
ALT: 15 U/L (ref 0–55)
AST: 11 U/L (ref 5–34)
Albumin: 3.4 g/dL — ABNORMAL LOW (ref 3.5–5.0)
Alkaline Phosphatase: 77 U/L (ref 40–150)
Anion Gap: 13 mEq/L — ABNORMAL HIGH (ref 3–11)
BUN: 9.1 mg/dL (ref 7.0–26.0)
CO2: 23 mEq/L (ref 22–29)
Calcium: 9.5 mg/dL (ref 8.4–10.4)
Chloride: 106 mEq/L (ref 98–109)
Creatinine: 0.7 mg/dL (ref 0.6–1.1)
EGFR: 88 mL/min/{1.73_m2} — ABNORMAL LOW (ref >90)
Glucose: 124 mg/dl (ref 70–140)
Potassium: 3.4 mEq/L — ABNORMAL LOW (ref 3.5–5.1)
Sodium: 142 mEq/L (ref 136–145)
Total Bilirubin: <0.2 mg/dL (ref 0.20–1.20)
Total Protein: 7 g/dL (ref 6.4–8.3)

## 2014-08-18 LAB — CBC WITH DIFFERENTIAL/PLATELET
BASO%: 0 % (ref 0.0–2.0)
Basophils Absolute: 0 10*3/uL (ref 0.0–0.1)
EOS%: 0 % (ref 0.0–7.0)
Eosinophils Absolute: 0 10*3/uL (ref 0.0–0.5)
HCT: 36.4 % (ref 34.8–46.6)
HGB: 11.6 g/dL (ref 11.6–15.9)
LYMPH%: 5.8 % — ABNORMAL LOW (ref 14.0–49.7)
MCH: 31.5 pg (ref 25.1–34.0)
MCHC: 31.9 g/dL (ref 31.5–36.0)
MCV: 98.9 fL (ref 79.5–101.0)
MONO#: 0.6 10*3/uL (ref 0.1–0.9)
MONO%: 7.5 % (ref 0.0–14.0)
NEUT#: 7.1 10*3/uL — ABNORMAL HIGH (ref 1.5–6.5)
NEUT%: 86.7 % — ABNORMAL HIGH (ref 38.4–76.8)
Platelets: 438 10*3/uL — ABNORMAL HIGH (ref 145–400)
RBC: 3.68 10*6/uL — ABNORMAL LOW (ref 3.70–5.45)
RDW: 13.9 % (ref 11.2–14.5)
WBC: 8.2 10*3/uL (ref 3.9–10.3)
lymph#: 0.5 10*3/uL — ABNORMAL LOW (ref 0.9–3.3)

## 2014-08-18 MED ORDER — ONDANSETRON HCL 8 MG PO TABS
ORAL_TABLET | ORAL | Status: AC
  Filled 2014-08-18: qty 1

## 2014-08-18 MED ORDER — DEXAMETHASONE SODIUM PHOSPHATE 10 MG/ML IJ SOLN
10.0000 mg | Freq: Once | INTRAMUSCULAR | Status: AC
  Administered 2014-08-18: 10 mg via INTRAVENOUS

## 2014-08-18 MED ORDER — DEXAMETHASONE SODIUM PHOSPHATE 10 MG/ML IJ SOLN
INTRAMUSCULAR | Status: AC
  Filled 2014-08-18: qty 1

## 2014-08-18 MED ORDER — ONDANSETRON 8 MG/50ML IVPB (CHCC)
8.0000 mg | Freq: Once | INTRAVENOUS | Status: AC
  Administered 2014-08-18: 8 mg via INTRAVENOUS

## 2014-08-18 MED ORDER — SODIUM CHLORIDE 0.9 % IV SOLN
500.0000 mg/m2 | Freq: Once | INTRAVENOUS | Status: AC
  Administered 2014-08-18: 800 mg via INTRAVENOUS
  Filled 2014-08-18: qty 32

## 2014-08-18 MED ORDER — SODIUM CHLORIDE 0.9 % IV SOLN
Freq: Once | INTRAVENOUS | Status: AC
  Administered 2014-08-18: 10:00:00 via INTRAVENOUS

## 2014-08-18 MED ORDER — ONDANSETRON 8 MG/NS 50 ML IVPB
INTRAVENOUS | Status: AC
  Filled 2014-08-18: qty 8

## 2014-08-18 NOTE — [facility] Oncology Navigator Note (Unsigned)
Spoke with Ms. Alexis [name] and husband today.  She stated she was feeling well today.  Dr. Julien Nordmann gave her good news regarding her scan.  She was very thankful for all the help she has received here at cancer center.

## 2014-08-18 NOTE — Progress Notes (Signed)
[facility] [phone]   Fax:([phone]) [place], [place] [address] [place] [address]  DIAGNOSIS: Stage IV (T2a, N0, M1b) non-small cell lung cancer consistent with adenocarcinoma with negative EGFR mutation and negative ALK gene translocation diagnosed in January of 2015 presented with right upper lobe lung mass in addition to a solitary brain metastasis status post stereotactic to a solitary brain metastasis as well as radiotherapy to the right upper lobe lung mass.  PRIOR THERAPY:  1) Status post stereotactic radiotherapy to a solitary right parietal brain lesion under the care of Dr. [name] on 10/16/2013. 2) Status post palliative radiotherapy to the right lung tumor under the care of Dr. [name] completed on 12/05/2013. 3) Systemic chemotherapy with carboplatin for AUC of 5 and Alimta 500 mg/M2 every 3 weeks. First dose Jan 06 2014. Status post 6 cycles.  CURRENT THERAPY: Systemic chemotherapy with maintenance Alimta 500 mg/M2 every 3 weeks. Status post 3 cycles.  INTERVAL HISTORY: Alexis [name] 63 y.o. female returns to the clinic today for followup visit accompanied by her husband. The patient is tolerating her maintenance chemotherapy with Alimta fairly well. She denied having any significant chest pain, shortness of breath, cough or hemoptysis. She has no fever or chills, no nausea or vomiting. She denied having any significant weight loss or night sweats. She had repeat CT scan of the chest, abdomen and pelvis and she is here for evaluation and discussion of her scan results.  MEDICAL HISTORY: Past Medical History  Diagnosis Date  . Ileus, postoperative 11/18/2013  . Physical deconditioning 11/18/2013  . Severe protein-calorie malnutrition 11/18/2013  . Anemia 11/18/2013  . History of radiation therapy 10-28-13- 12-05-13    lung ca 50 Gy/3f  . Anxiety   . Cancer     hx of cervical non small cell lung cancer  adenocarcioma with brain meta  . Hx of radiation therapy 10/16/13    rt parietal brain/20Gy    ALLERGIES:  is allergic to codeine.  MEDICATIONS:  Current Outpatient Prescriptions  Medication Sig Dispense Refill  . acetaminophen (TYLENOL) 500 MG tablet Take 500 mg by mouth every 6 (six) hours as needed for mild pain or headache.    . ALPRAZolam (XANAX) 0.25 MG tablet Take 1 tablet (0.25 mg total) by mouth 3 (three) times daily as needed for anxiety. 45 tablet 0  . Ascorbic Acid (VITAMIN C GUMMIE PO) Take 1 each by mouth every morning.    .Marland Kitchendexamethasone (DECADRON) 4 MG tablet 4 mg by mouth twice a day the day before, day of and day after the chemotherapy every 3 weeks 40 tablet 1  . Multiple Vitamin (MULTIVITAMIN WITH MINERALS) TABS tablet Take 1 tablet by mouth every morning.    . ondansetron (ZOFRAN) 8 MG tablet Take 1 tablet (8 mg total) by mouth every 8 (eight) hours as needed for nausea or vomiting. 30 tablet 0  . OVER THE COUNTER MEDICATION Take 1 tablet by mouth every morning. (Vitamin A)    . oxyCODONE (OXY IR/ROXICODONE) 5 MG immediate release tablet Take 1-2 tablets (5-10 mg total) by mouth every 4 (four) hours as needed for moderate pain. 40 tablet 0  . prochlorperazine (COMPAZINE) 10 MG tablet Take 1 tablet (10 mg total) by mouth every 6 (six) hours as needed for nausea or vomiting. 60 tablet 0  . Zinc 50 MG TABS Take 1 tablet by mouth every morning.  No current facility-administered medications for this visit.    SURGICAL HISTORY:  Past Surgical History  Procedure Laterality Date  . Video bronchoscopy Bilateral 08/30/2013    Procedure: VIDEO BRONCHOSCOPY WITH FLUORO;  Surgeon: Tanda Rockers, MD;  Location: [facility];  Service: Cardiopulmonary;  Laterality: Bilateral;  . Abdominal hysterectomy    . Laparotomy N/A 11/03/2013    Procedure: EXPLORATORY LAPAROTOMY, DRAINAGE OF INTRA  ABDOMINAL ABSCESSES, MOBILIZATION OF SPLENIC FLEXURE, SIGMOID COLECTOMY WITH COLOSTOMY;   Surgeon: Odis Hollingshead, MD;  Location: [facility];  Service: General;  Laterality: N/A;  . Colostomy takedown N/A 07/10/2014    Procedure: LAPAROSCOPIC LYSIS OF ADHESIONS (90 MIN) LAPAROSCOPIC ASSISTED COLOSTOMY CLOSURE, RIGID PROCTOSIGMOIDOSCOPY;  Surgeon: Jackolyn Confer, MD;  Location: [facility];  Service: General;  Laterality: N/A;    REVIEW OF SYSTEMS:  Constitutional: positive for fatigue Eyes: negative Ears, nose, mouth, throat, and face: negative Respiratory: negative Cardiovascular: negative Gastrointestinal: negative Genitourinary:negative Integument/breast: negative Hematologic/lymphatic: negative Musculoskeletal:negative Neurological: positive for weakness Behavioral/Psych: positive for depression Endocrine: negative Allergic/Immunologic: negative   PHYSICAL EXAMINATION: General appearance: alert, cooperative, fatigued and no distress Head: Normocephalic, without obvious abnormality, atraumatic Neck: no adenopathy, no JVD, supple, symmetrical, trachea midline and thyroid not enlarged, symmetric, no tenderness/mass/nodules Lymph nodes: Cervical, supraclavicular, and axillary nodes normal. Resp: clear to auscultation bilaterally Back: symmetric, no curvature. ROM normal. No CVA tenderness. Cardio: regular rate and rhythm, S1, S2 normal, no murmur, click, rub or gallop GI: soft, non-tender; bowel sounds normal; no masses,  no organomegaly Extremities: extremities normal, atraumatic, no cyanosis or edema Neurologic: Alert and oriented X 3, normal strength and tone. Normal symmetric reflexes. Normal coordination and gait  ECOG PERFORMANCE STATUS: 2 - Symptomatic, <50% confined to bed  Blood pressure 112/53, pulse 77, temperature 98.2 F (36.8 C), temperature source Oral, resp. rate 19, height $RemoveBe'5\' 4"'RseDWuexp$  (1.626 m), weight 123 lb 12.8 oz (56.155 kg), SpO2 100 %.  LABORATORY DATA: Lab Results  Component Value Date   WBC 8.2 08/18/2014   HGB 11.6 08/18/2014   HCT 36.4 08/18/2014     MCV 98.9 08/18/2014   PLT 438* 08/18/2014      Chemistry      Component Value Date/Time   NA 141 07/28/2014 0924   NA 139 07/12/2014 0512   K 4.2 07/28/2014 0924   K 3.9 07/12/2014 0512   CL 100 07/12/2014 0512   CO2 27 07/28/2014 0924   CO2 32 07/12/2014 0512   BUN 12.4 07/28/2014 0924   BUN 5* 07/12/2014 0512   CREATININE 0.8 07/28/2014 0924   CREATININE 0.55 07/12/2014 0512      Component Value Date/Time   CALCIUM 10.1 07/28/2014 0924   CALCIUM 9.0 07/12/2014 0512   ALKPHOS 68 07/28/2014 0924   ALKPHOS 91 07/10/2014 0955   AST 10 07/28/2014 0924   AST 15 07/10/2014 0955   ALT <6 07/28/2014 0924   ALT 8 07/10/2014 0955   BILITOT 0.38 07/28/2014 0924   BILITOT 0.4 07/10/2014 0955       RADIOGRAPHIC STUDIES: Ct Chest W Contrast  08/15/2014   CLINICAL DATA:  Subsequent evaluation of a 63 year old female with history of lung cancer diagnosed in December 2014 status post radiation therapy to the lung and brain completed in March 2015. Chemotherapy in progress.  EXAM: CT CHEST, ABDOMEN, AND PELVIS WITH CONTRAST  TECHNIQUE: Multidetector CT imaging of the chest, abdomen and pelvis was performed following the standard protocol during bolus administration of intravenous contrast.  CONTRAST:  196mL OMNIPAQUE IOHEXOL  300 MG/ML  SOLN  COMPARISON:  CT of the chest, abdomen and pelvis 05/08/2014.  FINDINGS: CT CHEST FINDINGS  Mediastinum: 1 cm short axis right hilar lymph node is unchanged. No other mediastinal or hilar lymphadenopathy is noted on today's examination. Heart size is normal. There is no significant pericardial fluid, thickening or pericardial calcification. Atherosclerotic calcifications in the thoracic aorta and great vessels of the mediastinum. No coronary artery calcifications are noted at this time. Esophagus is unremarkable in appearance.  Lungs/Pleura: Again noted is a large nodule in the apex of the right upper lobe which is unchanged compared to recent prior  examinations measuring 2.3 x 2.2 x 2.8 cm (images 14 of series 5 and coronal image 45). There continues to be some adjacent architectural distortion surrounding this and a small amount of adjacent ground-glass attenuation, presumably post treatment related changes from prior radiation therapy. Overlying pleural thickening is chronic and unchanged. No new suspicious appearing pulmonary nodules or masses are otherwise noted. No acute consolidative airspace disease. No pleural effusions. Bilateral apical pleural parenchymal thickening, similar to the prior examination, compatible with chronic post infectious or inflammatory scarring. Mild centrilobular and moderate paraseptal emphysema.  Musculoskeletal: There are no aggressive appearing lytic or blastic lesions noted in the visualized portions of the skeleton.  CT ABDOMEN AND PELVIS FINDINGS  Hepatobiliary: No cystic or solid hepatic lesions. No intra or extrahepatic biliary ductal dilatation. Gallbladder is normal in appearance.  Pancreas: Unremarkable.  Spleen: Unremarkable.  Adrenals/Urinary Tract: Bilateral adrenal glands and bilateral kidneys are normal in appearance. No hydroureteronephrosis. Urinary bladder is normal in appearance.  Stomach/Bowel: Normal appearance of the stomach. No pathologic dilatation of small bowel or colon. Postoperative changes of partial colectomy are noted, with a suture line in knee region of the distal sigmoid colon. Compared to the prior study, there has been interval take down of the previously noted colostomy.  Vascular/Lymphatic: Atherosclerosis throughout the abdominal and pelvic vasculature, without evidence of aneurysm or dissection. No lymphadenopathy noted in the abdomen or pelvis.  Reproductive: Status post hysterectomy. Ovaries are not confidently identified may be surgically absent or atrophic.  Other: No significant volume of ascites.  No pneumoperitoneum.  Musculoskeletal: There are no aggressive appearing lytic or  blastic lesions noted in the visualized portions of the skeleton.  IMPRESSION: 1. Today's examination again demonstrates stable disease with no change in the size of the right upper lobe pulmonary nodule or the right hilar lymph nodes seen on prior studies. No new evidence of metastatic disease are noted in the chest, abdomen or pelvis. 2. Colostomy reversal since the prior examination. 3. Additional findings, as above, similar to prior examinations.   Electronically Signed   By: Vinnie Langton M.D.   On: 08/15/2014 12:49   Mr [name] JO Contrast  07/30/2014   CLINICAL DATA:  63 year old female with metastatic lung cancer status post SRS treatment of brain metastases. Restaging. Subsequent encounter.  EXAM: MRI HEAD WITHOUT AND WITH CONTRAST  TECHNIQUE: Multiplanar, multiecho pulse sequences of the brain and surrounding structures were obtained without and with intravenous contrast.  CONTRAST:  60mL MULTIHANCE GADOBENATE DIMEGLUMINE 529 MG/ML IV SOLN  COMPARISON:  04/22/2014 and earlier.  FINDINGS: Mild motion artifact on post-contrast imaging today. Chronic enhancing right parietal lobe metastasis re- identified on series 8, image 122. Intensity of enhancement appears increased with loss of some distinctness of the margin of the lesion. Surrounding T2 and FLAIR hyperintensity is stable since August. No significant mass effect. Mildly restricted diffusion associated with  the lesion is unchanged since May.  No new brain metastasis identified.  Major intracranial vascular flow voids are stable. No restricted diffusion or evidence of acute infarction. Stable cerebral volume. No ventriculomegaly. No acute intracranial hemorrhage identified. 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 No new signal abnormality. No intracranial mass effect. Negative pituitary and cervicomedullary junction. Negative visualized cervical spine. Bone marrow signal stable and within normal limits.   Visualized orbit soft tissues are within normal limits. Stable paranasal sinuses and mastoids. Visible internal auditory structures appear normal. Stable scalp soft tissues, including a left posterior convexity sebaceous cyst or similar lesion.  IMPRESSION: 1. No significant change in the solitary right parietal lobe brain metastasis. Surrounding FLAIR signal abnormality favored due to treatment effect is stable since August. 2. No new brain metastasis or new intracranial abnormality identified.   Electronically Signed   By: Lars Pinks M.D.   On: 07/30/2014 13:19   Ct Abdomen Pelvis W Contrast  08/15/2014   CLINICAL DATA:  Subsequent evaluation of a 63 year old female with history of lung cancer diagnosed in December 2014 status post radiation therapy to the lung and brain completed in March 2015. Chemotherapy in progress.  EXAM: CT CHEST, ABDOMEN, AND PELVIS WITH CONTRAST  TECHNIQUE: Multidetector CT imaging of the chest, abdomen and pelvis was performed following the standard protocol during bolus administration of intravenous contrast.  CONTRAST:  169mL OMNIPAQUE IOHEXOL 300 MG/ML  SOLN  COMPARISON:  CT of the chest, abdomen and pelvis 05/08/2014.  FINDINGS: CT CHEST FINDINGS  Mediastinum: 1 cm short axis right hilar lymph node is unchanged. No other mediastinal or hilar lymphadenopathy is noted on today's examination. Heart size is normal. There is no significant pericardial fluid, thickening or pericardial calcification. Atherosclerotic calcifications in the thoracic aorta and great vessels of the mediastinum. No coronary artery calcifications are noted at this time. Esophagus is unremarkable in appearance.  Lungs/Pleura: Again noted is a large nodule in the apex of the right upper lobe which is unchanged compared to recent prior examinations measuring 2.3 x 2.2 x 2.8 cm (images 14 of series 5 and coronal image 45). There continues to be some adjacent architectural distortion surrounding this and a small  amount of adjacent ground-glass attenuation, presumably post treatment related changes from prior radiation therapy. Overlying pleural thickening is chronic and unchanged. No new suspicious appearing pulmonary nodules or masses are otherwise noted. No acute consolidative airspace disease. No pleural effusions. Bilateral apical pleural parenchymal thickening, similar to the prior examination, compatible with chronic post infectious or inflammatory scarring. Mild centrilobular and moderate paraseptal emphysema.  Musculoskeletal: There are no aggressive appearing lytic or blastic lesions noted in the visualized portions of the skeleton.  CT ABDOMEN AND PELVIS FINDINGS  Hepatobiliary: No cystic or solid hepatic lesions. No intra or extrahepatic biliary ductal dilatation. Gallbladder is normal in appearance.  Pancreas: Unremarkable.  Spleen: Unremarkable.  Adrenals/Urinary Tract: Bilateral adrenal glands and bilateral kidneys are normal in appearance. No hydroureteronephrosis. Urinary bladder is normal in appearance.  Stomach/Bowel: Normal appearance of the stomach. No pathologic dilatation of small bowel or colon. Postoperative changes of partial colectomy are noted, with a suture line in knee region of the distal sigmoid colon. Compared to the prior study, there has been interval take down of the previously noted colostomy.  Vascular/Lymphatic: Atherosclerosis throughout the abdominal and pelvic vasculature, without evidence of aneurysm or dissection. No lymphadenopathy noted in the abdomen or pelvis.  Reproductive: Status post hysterectomy. Ovaries are not  confidently identified may be surgically absent or atrophic.  Other: No significant volume of ascites.  No pneumoperitoneum.  Musculoskeletal: There are no aggressive appearing lytic or blastic lesions noted in the visualized portions of the skeleton.  IMPRESSION: 1. Today's examination again demonstrates stable disease with no change in the size of the right upper  lobe pulmonary nodule or the right hilar lymph nodes seen on prior studies. No new evidence of metastatic disease are noted in the chest, abdomen or pelvis. 2. Colostomy reversal since the prior examination. 3. Additional findings, as above, similar to prior examinations.   Electronically Signed   By: Vinnie Langton M.D.   On: 08/15/2014 12:49    ASSESSMENT AND PLAN: This is a very pleasant 63 years old white female recently diagnosed with a stage IV non-small cell lung cancer, adenocarcinoma with negative EGFR mutation and negative ALK gene translocation. The patient is status post stereotactic radiotherapy to a solitary brain metastases in addition to palliative radiotherapy to the right upper lobe lung mass. She status post 6 cycles of systemic chemotherapy with carboplatin and Alimta with improvement in her disease and she is currently undergoing maintenance chemotherapy with single agent Alimta status post 3 cycles and tolerating her treatment fairly well. The recent CT scan of the chest, abdomen and pelvis showed no evidence for disease progression. I discussed the scan results with the patient and her husband and recommended for her to continue her maintenance chemotherapy The patient will receive cycle #4 today. She would come back for follow-up visit in 3 weeks with the next cycle of her treatment. For history of depression, the patient on Remeron 30 mg by mouth each bedtime. For anxiety, she is currently on Xanax 0.25 mg by mouth 3 times a day as needed. The patient was advised to call immediately if she has any concerning symptoms in the interval. The patient voices understanding of current disease status and treatment options and is in agreement with the current care plan.  All questions were answered. The patient knows to call the clinic with any problems, questions or concerns. We can certainly see the patient much sooner if necessary.  Disclaimer: This note was dictated with voice  recognition software. Similar sounding words can inadvertently be transcribed and may not be corrected upon review.

## 2014-08-18 NOTE — Patient Instructions (Signed)
[facility] Discharge Instructions for Patients Receiving Chemotherapy  Today you received the following chemotherapy agents:  Alimta  To help prevent nausea and vomiting after your treatment, we encourage you to take your nausea medication If you develop nausea and vomiting that is not controlled by your nausea medication, call the clinic.   BELOW ARE SYMPTOMS THAT SHOULD BE REPORTED IMMEDIATELY:  *FEVER GREATER THAN 100.5 F  *CHILLS WITH OR WITHOUT FEVER  NAUSEA AND VOMITING THAT IS NOT CONTROLLED WITH YOUR NAUSEA MEDICATION  *UNUSUAL SHORTNESS OF BREATH  *UNUSUAL BRUISING OR BLEEDING  TENDERNESS IN MOUTH AND THROAT WITH OR WITHOUT PRESENCE OF ULCERS  *URINARY PROBLEMS  *BOWEL PROBLEMS  UNUSUAL RASH Items with * indicate a potential emergency and should be followed up as soon as possible.  Feel free to call the clinic you have any questions or concerns. The clinic phone number is [phone].

## 2014-08-18 NOTE — Telephone Encounter (Signed)
gv adn printed appt sched and avs for pt for Jan and Feb....sed added tx. °

## 2014-09-08 ENCOUNTER — Other Ambulatory Visit: Payer: Self-pay | Admitting: Medical Oncology

## 2014-09-08 ENCOUNTER — Encounter: Payer: Self-pay | Admitting: Physician Assistant

## 2014-09-08 ENCOUNTER — Other Ambulatory Visit (HOSPITAL_BASED_OUTPATIENT_CLINIC_OR_DEPARTMENT_OTHER): Payer: BLUE CROSS/BLUE SHIELD

## 2014-09-08 ENCOUNTER — Ambulatory Visit (HOSPITAL_BASED_OUTPATIENT_CLINIC_OR_DEPARTMENT_OTHER): Payer: BLUE CROSS/BLUE SHIELD | Admitting: Physician Assistant

## 2014-09-08 ENCOUNTER — Telehealth: Payer: Self-pay | Admitting: Internal Medicine

## 2014-09-08 ENCOUNTER — Ambulatory Visit (HOSPITAL_BASED_OUTPATIENT_CLINIC_OR_DEPARTMENT_OTHER): Payer: BLUE CROSS/BLUE SHIELD

## 2014-09-08 VITALS — BP 127/54 | HR 64 | Temp 98.2°F | Resp 18 | Ht 64.0 in | Wt 123.8 lb

## 2014-09-08 DIAGNOSIS — C3411 Malignant neoplasm of upper lobe, right bronchus or lung: Secondary | ICD-10-CM

## 2014-09-08 DIAGNOSIS — Z5111 Encounter for antineoplastic chemotherapy: Secondary | ICD-10-CM

## 2014-09-08 DIAGNOSIS — C7949 Secondary malignant neoplasm of other parts of nervous system: Principal | ICD-10-CM

## 2014-09-08 DIAGNOSIS — C7931 Secondary malignant neoplasm of brain: Secondary | ICD-10-CM

## 2014-09-08 DIAGNOSIS — C3410 Malignant neoplasm of upper lobe, unspecified bronchus or lung: Secondary | ICD-10-CM

## 2014-09-08 LAB — COMPREHENSIVE METABOLIC PANEL (CC13)
ALT: 8 U/L (ref 0–55)
AST: 13 U/L (ref 5–34)
Albumin: 3.5 g/dL (ref 3.5–5.0)
Alkaline Phosphatase: 84 U/L (ref 40–150)
Anion Gap: 11 mEq/L (ref 3–11)
BUN: 13.8 mg/dL (ref 7.0–26.0)
CO2: 27 mEq/L (ref 22–29)
Calcium: 9.6 mg/dL (ref 8.4–10.4)
Chloride: 102 mEq/L (ref 98–109)
Creatinine: 0.8 mg/dL (ref 0.6–1.1)
EGFR: 85 mL/min/{1.73_m2} — ABNORMAL LOW (ref >90)
Glucose: 113 mg/dl (ref 70–140)
Potassium: 3.6 mEq/L (ref 3.5–5.1)
Sodium: 140 mEq/L (ref 136–145)
Total Bilirubin: 0.38 mg/dL (ref 0.20–1.20)
Total Protein: 7.1 g/dL (ref 6.4–8.3)

## 2014-09-08 LAB — CBC WITH DIFFERENTIAL/PLATELET
BASO%: 0 % (ref 0.0–2.0)
Basophils Absolute: 0 10*3/uL (ref 0.0–0.1)
EOS%: 0 % (ref 0.0–7.0)
Eosinophils Absolute: 0 10*3/uL (ref 0.0–0.5)
HCT: 33.7 % — ABNORMAL LOW (ref 34.8–46.6)
HGB: 10.7 g/dL — ABNORMAL LOW (ref 11.6–15.9)
LYMPH%: 8.7 % — ABNORMAL LOW (ref 14.0–49.7)
MCH: 31.5 pg (ref 25.1–34.0)
MCHC: 31.8 g/dL (ref 31.5–36.0)
MCV: 99.1 fL (ref 79.5–101.0)
MONO#: 1 10*3/uL — ABNORMAL HIGH (ref 0.1–0.9)
MONO%: 12.3 % (ref 0.0–14.0)
NEUT#: 6.3 10*3/uL (ref 1.5–6.5)
NEUT%: 79 % — ABNORMAL HIGH (ref 38.4–76.8)
Platelets: 407 10*3/uL — ABNORMAL HIGH (ref 145–400)
RBC: 3.4 10*6/uL — ABNORMAL LOW (ref 3.70–5.45)
RDW: 15.7 % — ABNORMAL HIGH (ref 11.2–14.5)
WBC: 8 10*3/uL (ref 3.9–10.3)
lymph#: 0.7 10*3/uL — ABNORMAL LOW (ref 0.9–3.3)

## 2014-09-08 MED ORDER — ONDANSETRON 8 MG/NS 50 ML IVPB
INTRAVENOUS | Status: AC
  Filled 2014-09-08: qty 8

## 2014-09-08 MED ORDER — DEXAMETHASONE SODIUM PHOSPHATE 10 MG/ML IJ SOLN
10.0000 mg | Freq: Once | INTRAMUSCULAR | Status: AC
  Administered 2014-09-08: 10 mg via INTRAVENOUS

## 2014-09-08 MED ORDER — ALPRAZOLAM 0.25 MG PO TABS: 0.2500 mg | ORAL_TABLET | Freq: Three times a day (TID) | ORAL | Status: DC | PRN

## 2014-09-08 MED ORDER — FOLIC ACID 1 MG PO TABS: 1.0000 mg | ORAL_TABLET | Freq: Every day | ORAL | Status: DC

## 2014-09-08 MED ORDER — ONDANSETRON 8 MG/50ML IVPB (CHCC)
8.0000 mg | Freq: Once | INTRAVENOUS | Status: AC
  Administered 2014-09-08: 8 mg via INTRAVENOUS

## 2014-09-08 MED ORDER — DEXAMETHASONE SODIUM PHOSPHATE 10 MG/ML IJ SOLN
INTRAMUSCULAR | Status: AC
  Filled 2014-09-08: qty 1

## 2014-09-08 MED ORDER — SODIUM CHLORIDE 0.9 % IV SOLN
Freq: Once | INTRAVENOUS | Status: AC
  Administered 2014-09-08: 12:00:00 via INTRAVENOUS

## 2014-09-08 MED ORDER — SODIUM CHLORIDE 0.9 % IV SOLN
500.0000 mg/m2 | Freq: Once | INTRAVENOUS | Status: AC
  Administered 2014-09-08: 800 mg via INTRAVENOUS
  Filled 2014-09-08: qty 32

## 2014-09-08 NOTE — Progress Notes (Addendum)
[place] Telephone:([phone]   Fax:(336) [place], MD [address] [place] [address]  DIAGNOSIS: Stage IV (T2a, N0, M1b) non-small cell lung cancer consistent with adenocarcinoma with negative EGFR mutation and negative ALK gene translocation diagnosed in January of 2015 presented with right upper lobe lung mass in addition to a solitary brain metastasis status post stereotactic to a solitary brain metastasis as well as radiotherapy to the right upper lobe lung mass.  PRIOR THERAPY:  1) Status post stereotactic radiotherapy to a solitary right parietal brain lesion under the care of Dr. [name] on 10/16/2013. 2) Status post palliative radiotherapy to the right lung tumor under the care of Dr. [name] completed on 12/05/2013. 3) Systemic chemotherapy with carboplatin for AUC of 5 and Alimta 500 mg/M2 every 3 weeks. First dose Jan 06 2014. Status post 6 cycles.  CURRENT THERAPY: Systemic chemotherapy with maintenance Alimta 500 mg/M2 every 3 weeks. Status post 4 cycles.  INTERVAL HISTORY: Alexis [name] 64 y.o. female returns to the clinic today for followup visit accompanied by her husband. The patient is tolerating her maintenance chemotherapy with Alimta fairly well. She denied having any significant chest pain, shortness of breath, cough or hemoptysis. She has no fever or chills, no significant nausea or vomiting. She does have some occasional nausea that is well managed by her current antiemetic She denied having any significant weight loss or night sweats. She requests a refill of her Xanax that manages her occasional anxiety symptoms well. She reports that she had her colostomy reversed and that Dr. Sherren Mocha has released her from his care.  MEDICAL HISTORY: Past Medical History  Diagnosis Date  . Ileus, postoperative 11/18/2013  . Physical deconditioning 11/18/2013  . Severe protein-calorie malnutrition 11/18/2013  . Anemia  11/18/2013  . History of radiation therapy 10-28-13- 12-05-13    lung ca 50 Gy/72f  . Anxiety   . 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 Hx of radiation therapy 10/16/13    rt parietal brain/20Gy    ALLERGIES:  is allergic to codeine.  MEDICATIONS:  Current Outpatient Prescriptions  Medication Sig Dispense Refill  . ALPRAZolam (XANAX) 0.25 MG tablet Take 1 tablet (0.25 mg total) by mouth 3 (three) times daily as needed for anxiety. 45 tablet 0  . Ascorbic Acid (VITAMIN C GUMMIE PO) Take 1 each by mouth every morning.    .Marland Kitchendexamethasone (DECADRON) 4 MG tablet 4 mg by mouth twice a day the day before, day of and day after the chemotherapy every 3 weeks 40 tablet 1  . Multiple Vitamin (MULTIVITAMIN WITH MINERALS) TABS tablet Take 1 tablet by mouth every morning.    . ondansetron (ZOFRAN) 8 MG tablet Take 1 tablet (8 mg total) by mouth every 8 (eight) hours as needed for nausea or vomiting. 30 tablet 0  . OVER THE COUNTER MEDICATION Take 1 tablet by mouth every morning. (Vitamin A)    . oxyCODONE (OXY IR/ROXICODONE) 5 MG immediate release tablet Take 1-2 tablets (5-10 mg total) by mouth every 4 (four) hours as needed for moderate pain. 40 tablet 0  . prochlorperazine (COMPAZINE) 10 MG tablet Take 1 tablet (10 mg total) by mouth every 6 (six) hours as needed for nausea or vomiting. 60 tablet 0  . Zinc 50 MG TABS Take 1 tablet by mouth every morning.    .Marland Kitchenacetaminophen (  TYLENOL) 500 MG tablet Take 500 mg by mouth every 6 (six) hours as needed for mild pain or headache.    . folic acid (FOLVITE) 1 MG tablet Take 1 tablet (1 mg total) by mouth daily. 30 tablet 2   No current facility-administered medications for this visit.    SURGICAL HISTORY:  Past Surgical History  Procedure Laterality Date  . Video bronchoscopy Bilateral 08/30/2013    Procedure: VIDEO BRONCHOSCOPY WITH FLUORO;  Surgeon: Tanda Rockers, MD;  Location: [facility];  Service:  Cardiopulmonary;  Laterality: Bilateral;  . Abdominal hysterectomy    . Laparotomy N/A 11/03/2013    Procedure: EXPLORATORY LAPAROTOMY, DRAINAGE OF INTRA  ABDOMINAL ABSCESSES, MOBILIZATION OF SPLENIC FLEXURE, SIGMOID COLECTOMY WITH COLOSTOMY;  Surgeon: Odis Hollingshead, MD;  Location: [facility];  Service: General;  Laterality: N/A;  . Colostomy takedown N/A 07/10/2014    Procedure: LAPAROSCOPIC LYSIS OF ADHESIONS (90 MIN) LAPAROSCOPIC ASSISTED COLOSTOMY CLOSURE, RIGID PROCTOSIGMOIDOSCOPY;  Surgeon: Jackolyn Confer, MD;  Location: [facility];  Service: General;  Laterality: N/A;    REVIEW OF SYSTEMS:  Constitutional: positive for fatigue Eyes: negative Ears, nose, mouth, throat, and face: negative Respiratory: negative Cardiovascular: negative Gastrointestinal: negative Genitourinary:negative Integument/breast: negative Hematologic/lymphatic: negative Musculoskeletal:negative Neurological: positive for weakness Behavioral/Psych: positive for depression Endocrine: negative Allergic/Immunologic: negative   PHYSICAL EXAMINATION: General appearance: alert, cooperative, fatigued and no distress Head: Normocephalic, without obvious abnormality, atraumatic Neck: no adenopathy, no JVD, supple, symmetrical, trachea midline and thyroid not enlarged, symmetric, no tenderness/mass/nodules Lymph nodes: Cervical, supraclavicular, and axillary nodes normal. Resp: clear to auscultation bilaterally Back: symmetric, no curvature. ROM normal. No CVA tenderness. Cardio: regular rate and rhythm, S1, S2 normal, no murmur, click, rub or gallop GI: soft, non-tender; bowel sounds normal; no masses,  no organomegaly Extremities: extremities normal, atraumatic, no cyanosis or edema Neurologic: Alert and oriented X 3, normal strength and tone. Normal symmetric reflexes. Normal coordination and gait  ECOG PERFORMANCE STATUS: 2 - Symptomatic, <50% confined to bed  Blood pressure 127/54, pulse 64, temperature 98.2 F  (36.8 C), temperature source Oral, resp. rate 18, height _0  (1.626 m), weight 123 lb 12.8 oz (56.155 kg), SpO2 100 %.  LABORATORY DATA: Lab Results  Component Value Date   WBC 8.0 09/08/2014   HGB 10.7* 09/08/2014   HCT 33.7* 09/08/2014   MCV 99.1 09/08/2014   PLT 407* 09/08/2014      Chemistry      Component Value Date/Time   NA 140 09/08/2014 0944   NA 139 07/12/2014 0512   K 3.6 09/08/2014 0944   K 3.9 07/12/2014 0512   CL 100 07/12/2014 0512   CO2 27 09/08/2014 0944   CO2 32 07/12/2014 0512   BUN 13.8 09/08/2014 0944   BUN 5* 07/12/2014 0512   CREATININE 0.8 09/08/2014 0944   CREATININE 0.55 07/12/2014 0512      Component Value Date/Time   CALCIUM 9.6 09/08/2014 0944   CALCIUM 9.0 07/12/2014 0512   ALKPHOS 84 09/08/2014 0944   ALKPHOS 91 07/10/2014 0955   AST 13 09/08/2014 0944   AST 15 07/10/2014 0955   ALT 8 09/08/2014 0944   ALT 8 07/10/2014 0955   BILITOT 0.38 09/08/2014 0944   BILITOT 0.4 07/10/2014 0955       RADIOGRAPHIC STUDIES: Ct Chest W Contrast  08/15/2014   CLINICAL DATA:  Subsequent evaluation of a 64 year old female with history of lung cancer diagnosed in December 2014 status post radiation therapy to the lung and brain completed in  March 2015. Chemotherapy in progress.  EXAM: CT CHEST, ABDOMEN, AND PELVIS WITH CONTRAST  TECHNIQUE: Multidetector CT imaging of the chest, abdomen and pelvis was performed following the standard protocol during bolus administration of intravenous contrast.  CONTRAST:  127m OMNIPAQUE IOHEXOL 300 MG/ML  SOLN  COMPARISON:  CT of the chest, abdomen and pelvis 05/08/2014.  FINDINGS: CT CHEST FINDINGS  Mediastinum: 1 cm short axis right hilar lymph node is unchanged. No other mediastinal or hilar lymphadenopathy is noted on today's examination. Heart size is normal. There is no significant pericardial fluid, thickening or pericardial calcification. Atherosclerotic calcifications in the thoracic aorta and great vessels of  the mediastinum. No coronary artery calcifications are noted at this time. Esophagus is unremarkable in appearance.  Lungs/Pleura: Again noted is a large nodule in the apex of the right upper lobe which is unchanged compared to recent prior examinations measuring 2.3 x 2.2 x 2.8 cm (images 14 of series 5 and coronal image 45). There continues to be some adjacent architectural distortion surrounding this and a small amount of adjacent ground-glass attenuation, presumably post treatment related changes from prior radiation therapy. Overlying pleural thickening is chronic and unchanged. No new suspicious appearing pulmonary nodules or masses are otherwise noted. No acute consolidative airspace disease. No pleural effusions. Bilateral apical pleural parenchymal thickening, similar to the prior examination, compatible with chronic post infectious or inflammatory scarring. Mild centrilobular and moderate paraseptal emphysema.  Musculoskeletal: There are no aggressive appearing lytic or blastic lesions noted in the visualized portions of the skeleton.  CT ABDOMEN AND PELVIS FINDINGS  Hepatobiliary: No cystic or solid hepatic lesions. No intra or extrahepatic biliary ductal dilatation. Gallbladder is normal in appearance.  Pancreas: Unremarkable.  Spleen: Unremarkable.  Adrenals/Urinary Tract: Bilateral adrenal glands and bilateral kidneys are normal in appearance. No hydroureteronephrosis. Urinary bladder is normal in appearance.  Stomach/Bowel: Normal appearance of the stomach. No pathologic dilatation of small bowel or colon. Postoperative changes of partial colectomy are noted, with a suture line in knee region of the distal sigmoid colon. Compared to the prior study, there has been interval take down of the previously noted colostomy.  Vascular/Lymphatic: Atherosclerosis throughout the abdominal and pelvic vasculature, without evidence of aneurysm or dissection. No lymphadenopathy noted in the abdomen or pelvis.   Reproductive: Status post hysterectomy. Ovaries are not confidently identified may be surgically absent or atrophic.  Other: No significant volume of ascites.  No pneumoperitoneum.  Musculoskeletal: There are no aggressive appearing lytic or blastic lesions noted in the visualized portions of the skeleton.  IMPRESSION: 1. Today's examination again demonstrates stable disease with no change in the size of the right upper lobe pulmonary nodule or the right hilar lymph nodes seen on prior studies. No new evidence of metastatic disease are noted in the chest, abdomen or pelvis. 2. Colostomy reversal since the prior examination. 3. Additional findings, as above, similar to prior examinations.   Electronically Signed   By: DVinnie LangtonM.D.   On: 08/15/2014 12:49   Ct Abdomen Pelvis W Contrast  08/15/2014   CLINICAL DATA:  Subsequent evaluation of a 64year old female with history of lung cancer diagnosed in December 2014 status post radiation therapy to the lung and brain completed in March 2015. Chemotherapy in progress.  EXAM: CT CHEST, ABDOMEN, AND PELVIS WITH CONTRAST  TECHNIQUE: Multidetector CT imaging of the chest, abdomen and pelvis was performed following the standard protocol during bolus administration of intravenous contrast.  CONTRAST:  1019mOMNIPAQUE IOHEXOL 300 MG/ML  SOLN  COMPARISON:  CT of the chest, abdomen and pelvis 05/08/2014.  FINDINGS: CT CHEST FINDINGS  Mediastinum: 1 cm short axis right hilar lymph node is unchanged. No other mediastinal or hilar lymphadenopathy is noted on today's examination. Heart size is normal. There is no significant pericardial fluid, thickening or pericardial calcification. Atherosclerotic calcifications in the thoracic aorta and great vessels of the mediastinum. No coronary artery calcifications are noted at this time. Esophagus is unremarkable in appearance.  Lungs/Pleura: Again noted is a large nodule in the apex of the right upper lobe which is unchanged  compared to recent prior examinations measuring 2.3 x 2.2 x 2.8 cm (images 14 of series 5 and coronal image 45). There continues to be some adjacent architectural distortion surrounding this and a small amount of adjacent ground-glass attenuation, presumably post treatment related changes from prior radiation therapy. Overlying pleural thickening is chronic and unchanged. No new suspicious appearing pulmonary nodules or masses are otherwise noted. No acute consolidative airspace disease. No pleural effusions. Bilateral apical pleural parenchymal thickening, similar to the prior examination, compatible with chronic post infectious or inflammatory scarring. Mild centrilobular and moderate paraseptal emphysema.  Musculoskeletal: There are no aggressive appearing lytic or blastic lesions noted in the visualized portions of the skeleton.  CT ABDOMEN AND PELVIS FINDINGS  Hepatobiliary: No cystic or solid hepatic lesions. No intra or extrahepatic biliary ductal dilatation. Gallbladder is normal in appearance.  Pancreas: Unremarkable.  Spleen: Unremarkable.  Adrenals/Urinary Tract: Bilateral adrenal glands and bilateral kidneys are normal in appearance. No hydroureteronephrosis. Urinary bladder is normal in appearance.  Stomach/Bowel: Normal appearance of the stomach. No pathologic dilatation of small bowel or colon. Postoperative changes of partial colectomy are noted, with a suture line in knee region of the distal sigmoid colon. Compared to the prior study, there has been interval take down of the previously noted colostomy.  Vascular/Lymphatic: Atherosclerosis throughout the abdominal and pelvic vasculature, without evidence of aneurysm or dissection. No lymphadenopathy noted in the abdomen or pelvis.  Reproductive: Status post hysterectomy. Ovaries are not confidently identified may be surgically absent or atrophic.  Other: No significant volume of ascites.  No pneumoperitoneum.  Musculoskeletal: There are no  aggressive appearing lytic or blastic lesions noted in the visualized portions of the skeleton.  IMPRESSION: 1. Today's examination again demonstrates stable disease with no change in the size of the right upper lobe pulmonary nodule or the right hilar lymph nodes seen on prior studies. No new evidence of metastatic disease are noted in the chest, abdomen or pelvis. 2. Colostomy reversal since the prior examination. 3. Additional findings, as above, similar to prior examinations.   Electronically Signed   By: Vinnie Langton M.D.   On: 08/15/2014 12:49    ASSESSMENT AND PLAN: This is a very pleasant 64 years old white female recently diagnosed with a stage IV non-small cell lung cancer, adenocarcinoma with negative EGFR mutation and negative ALK gene translocation. The patient is status post stereotactic radiotherapy to a solitary brain metastases in addition to palliative radiotherapy to the right upper lobe lung mass. She status post 6 cycles of systemic chemotherapy with carboplatin and Alimta with improvement in her disease and she is currently undergoing maintenance chemotherapy with single agent Alimta status post 3 cycles and tolerating her treatment fairly well. The recent CT scan of the chest, abdomen and pelvis showed no evidence for disease progression. Patient was discussed with and also seen by Dr. Julien Nordmann. She'll proceed with cycle #5 today as scheduled. She will follow-up  in 3 weeks prior to cycle #6. would come back for follow-up visit in 3 weeks with the next cycle of her treatment. For history of depression, the patient on Remeron 30 mg by mouth each bedtime. For anxiety, she is currently on Xanax 0.25 mg by mouth 3 times a day as needed. She was given a refill prescription for Xanax tablets. The patient was advised to call immediately if she has any concerning symptoms in the interval. The patient voices understanding of current disease status and treatment options and is in agreement  with the current care plan.  All questions were answered. The patient knows to call the clinic with any problems, questions or concerns. We can certainly see the patient much sooner if necessary.  Carlton Adam, PA-C 09/08/2014  ADDENDUM:  Hematology/Oncology Attending:  I had a face to face encounter with the patient today. I recommended her care plan. This is a very pleasant 64 years old white female with stage IV non-small cell lung cancer status post induction chemotherapy with carboplatin and Alimta and she is currently undergoing maintenance chemotherapy with single agent Alimta status post 4 cycles and tolerating her treatment fairly well. I recommended for the patient to proceed with cycle #5 today as scheduled. She will come back for follow-up visit in 3 weeks with the next cycle of her treatment. She was advised to call immediately if she has any concerning symptoms in the interval.  Disclaimer: This note was dictated with voice recognition software. Similar sounding words can inadvertently be transcribed and may not be corrected upon review. Eilleen Kempf., MD 09/08/2014

## 2014-09-08 NOTE — Telephone Encounter (Signed)
gv adn printed appt sched and avs for pt for Feb 2016

## 2014-09-08 NOTE — Patient Instructions (Signed)
[facility] Discharge Instructions for Patients Receiving Chemotherapy  Today you received the following chemotherapy agents Alimta.  To help prevent nausea and vomiting after your treatment, we encourage you to take your nausea medication as prescribed.   If you develop nausea and vomiting that is not controlled by your nausea medication, call the clinic.   BELOW ARE SYMPTOMS THAT SHOULD BE REPORTED IMMEDIATELY:  *FEVER GREATER THAN 100.5 F  *CHILLS WITH OR WITHOUT FEVER  NAUSEA AND VOMITING THAT IS NOT CONTROLLED WITH YOUR NAUSEA MEDICATION  *UNUSUAL SHORTNESS OF BREATH  *UNUSUAL BRUISING OR BLEEDING  TENDERNESS IN MOUTH AND THROAT WITH OR WITHOUT PRESENCE OF ULCERS  *URINARY PROBLEMS  *BOWEL PROBLEMS  UNUSUAL RASH Items with * indicate a potential emergency and should be followed up as soon as possible.  Feel free to call the clinic you have any questions or concerns. The clinic phone number is [phone].

## 2014-09-08 NOTE — Patient Instructions (Signed)
Follow-up in 3 weeks prior to next scheduled cycle of maintenance chemotherapy

## 2014-09-18 ENCOUNTER — Other Ambulatory Visit: Payer: Self-pay | Admitting: Radiation Therapy

## 2014-09-18 DIAGNOSIS — C7931 Secondary malignant neoplasm of brain: Secondary | ICD-10-CM

## 2014-09-29 ENCOUNTER — Other Ambulatory Visit (HOSPITAL_BASED_OUTPATIENT_CLINIC_OR_DEPARTMENT_OTHER): Payer: BLUE CROSS/BLUE SHIELD

## 2014-09-29 ENCOUNTER — Encounter: Payer: Self-pay | Admitting: Physician Assistant

## 2014-09-29 ENCOUNTER — Ambulatory Visit: Payer: BC Managed Care – PPO

## 2014-09-29 ENCOUNTER — Telehealth: Payer: Self-pay | Admitting: Internal Medicine

## 2014-09-29 ENCOUNTER — Ambulatory Visit (HOSPITAL_BASED_OUTPATIENT_CLINIC_OR_DEPARTMENT_OTHER): Payer: BLUE CROSS/BLUE SHIELD | Admitting: Physician Assistant

## 2014-09-29 ENCOUNTER — Ambulatory Visit (HOSPITAL_BASED_OUTPATIENT_CLINIC_OR_DEPARTMENT_OTHER): Payer: BLUE CROSS/BLUE SHIELD

## 2014-09-29 VITALS — BP 117/56 | HR 71 | Temp 98.5°F | Resp 18 | Ht 64.0 in | Wt 126.3 lb

## 2014-09-29 DIAGNOSIS — C3411 Malignant neoplasm of upper lobe, right bronchus or lung: Secondary | ICD-10-CM

## 2014-09-29 DIAGNOSIS — C7931 Secondary malignant neoplasm of brain: Secondary | ICD-10-CM

## 2014-09-29 DIAGNOSIS — K1230 Oral mucositis (ulcerative), unspecified: Secondary | ICD-10-CM

## 2014-09-29 DIAGNOSIS — C3410 Malignant neoplasm of upper lobe, unspecified bronchus or lung: Secondary | ICD-10-CM

## 2014-09-29 DIAGNOSIS — F419 Anxiety disorder, unspecified: Secondary | ICD-10-CM

## 2014-09-29 DIAGNOSIS — Z5111 Encounter for antineoplastic chemotherapy: Secondary | ICD-10-CM

## 2014-09-29 DIAGNOSIS — F329 Major depressive disorder, single episode, unspecified: Secondary | ICD-10-CM

## 2014-09-29 LAB — COMPREHENSIVE METABOLIC PANEL (CC13)
ALT: 11 U/L (ref 0–55)
AST: 12 U/L (ref 5–34)
Albumin: 3.8 g/dL (ref 3.5–5.0)
Alkaline Phosphatase: 84 U/L (ref 40–150)
Anion Gap: 13 mEq/L — ABNORMAL HIGH (ref 3–11)
BUN: 17.5 mg/dL (ref 7.0–26.0)
CO2: 24 mEq/L (ref 22–29)
Calcium: 9.6 mg/dL (ref 8.4–10.4)
Chloride: 105 mEq/L (ref 98–109)
Creatinine: 0.8 mg/dL (ref 0.6–1.1)
EGFR: 81 mL/min/{1.73_m2} — ABNORMAL LOW (ref >90)
Glucose: 134 mg/dl (ref 70–140)
Potassium: 4.1 mEq/L (ref 3.5–5.1)
Sodium: 142 mEq/L (ref 136–145)
Total Bilirubin: 0.39 mg/dL (ref 0.20–1.20)
Total Protein: 7.3 g/dL (ref 6.4–8.3)

## 2014-09-29 LAB — CBC WITH DIFFERENTIAL/PLATELET
BASO%: 0 % (ref 0.0–2.0)
Basophils Absolute: 0 10*3/uL (ref 0.0–0.1)
EOS%: 0 % (ref 0.0–7.0)
Eosinophils Absolute: 0 10*3/uL (ref 0.0–0.5)
HCT: 35.5 % (ref 34.8–46.6)
HGB: 11.2 g/dL — ABNORMAL LOW (ref 11.6–15.9)
LYMPH%: 4 % — ABNORMAL LOW (ref 14.0–49.7)
MCH: 31.3 pg (ref 25.1–34.0)
MCHC: 31.5 g/dL (ref 31.5–36.0)
MCV: 99.2 fL (ref 79.5–101.0)
MONO#: 0.6 10*3/uL (ref 0.1–0.9)
MONO%: 6.5 % (ref 0.0–14.0)
NEUT#: 7.5 10*3/uL — ABNORMAL HIGH (ref 1.5–6.5)
NEUT%: 89.5 % — ABNORMAL HIGH (ref 38.4–76.8)
Platelets: 325 10*3/uL (ref 145–400)
RBC: 3.58 10*6/uL — ABNORMAL LOW (ref 3.70–5.45)
RDW: 16.3 % — ABNORMAL HIGH (ref 11.2–14.5)
WBC: 8.4 10*3/uL (ref 3.9–10.3)
lymph#: 0.3 10*3/uL — ABNORMAL LOW (ref 0.9–3.3)

## 2014-09-29 MED ORDER — DEXAMETHASONE 4 MG PO TABS: ORAL_TABLET | ORAL | Status: DC

## 2014-09-29 MED ORDER — CYANOCOBALAMIN 1000 MCG/ML IJ SOLN
1000.0000 ug | Freq: Once | INTRAMUSCULAR | Status: AC
  Administered 2014-09-29: 1000 ug via INTRAMUSCULAR

## 2014-09-29 MED ORDER — SODIUM CHLORIDE 0.9 % IV SOLN
500.0000 mg/m2 | Freq: Once | INTRAVENOUS | Status: AC
  Administered 2014-09-29: 800 mg via INTRAVENOUS
  Filled 2014-09-29: qty 32

## 2014-09-29 MED ORDER — CYANOCOBALAMIN 1000 MCG/ML IJ SOLN
INTRAMUSCULAR | Status: AC
  Filled 2014-09-29: qty 1

## 2014-09-29 MED ORDER — DEXAMETHASONE SODIUM PHOSPHATE 10 MG/ML IJ SOLN
INTRAMUSCULAR | Status: AC
  Filled 2014-09-29: qty 1

## 2014-09-29 MED ORDER — ALPRAZOLAM 0.25 MG PO TABS: 0.2500 mg | ORAL_TABLET | Freq: Three times a day (TID) | ORAL | Status: DC | PRN

## 2014-09-29 MED ORDER — DEXAMETHASONE SODIUM PHOSPHATE 10 MG/ML IJ SOLN
10.0000 mg | Freq: Once | INTRAMUSCULAR | Status: AC
  Administered 2014-09-29: 10 mg via INTRAVENOUS

## 2014-09-29 MED ORDER — MAGIC MOUTHWASH: 5.0000 mL | Freq: Four times a day (QID) | ORAL | Status: DC | PRN

## 2014-09-29 MED ORDER — SODIUM CHLORIDE 0.9 % IV SOLN
Freq: Once | INTRAVENOUS | Status: AC
  Administered 2014-09-29: 12:00:00 via INTRAVENOUS

## 2014-09-29 MED ORDER — ONDANSETRON 8 MG/NS 50 ML IVPB
INTRAVENOUS | Status: AC
  Filled 2014-09-29: qty 8

## 2014-09-29 MED ORDER — ONDANSETRON 8 MG/50ML IVPB (CHCC)
8.0000 mg | Freq: Once | INTRAVENOUS | Status: AC
  Administered 2014-09-29: 8 mg via INTRAVENOUS

## 2014-09-29 NOTE — Telephone Encounter (Signed)
gv and printed appt sched and avs for pt for Feb thru April....sed added tx.

## 2014-09-29 NOTE — Patient Instructions (Signed)
[facility] Discharge Instructions for Patients Receiving Chemotherapy  Today you received the following chemotherapy agents: Alimta  To help prevent nausea and vomiting after your treatment, we encourage you to take your nausea medication as prescribed by your physician.   If you develop nausea and vomiting that is not controlled by your nausea medication, call the clinic.   BELOW ARE SYMPTOMS THAT SHOULD BE REPORTED IMMEDIATELY:  *FEVER GREATER THAN 100.5 F  *CHILLS WITH OR WITHOUT FEVER  NAUSEA AND VOMITING THAT IS NOT CONTROLLED WITH YOUR NAUSEA MEDICATION  *UNUSUAL SHORTNESS OF BREATH  *UNUSUAL BRUISING OR BLEEDING  TENDERNESS IN MOUTH AND THROAT WITH OR WITHOUT PRESENCE OF ULCERS  *URINARY PROBLEMS  *BOWEL PROBLEMS  UNUSUAL RASH Items with * indicate a potential emergency and should be followed up as soon as possible.  Feel free to call the clinic you have any questions or concerns. The clinic phone number is [phone].

## 2014-09-29 NOTE — Progress Notes (Addendum)
[place] [phone]   Fax:([phone]) [place], MD [address] [place] [address]  DIAGNOSIS: Stage IV (T2a, N0, M1b) non-small cell lung cancer consistent with adenocarcinoma with negative EGFR mutation and negative ALK gene translocation diagnosed in January of 2015 presented with right upper lobe lung mass in addition to a solitary brain metastasis status post stereotactic to a solitary brain metastasis as well as radiotherapy to the right upper lobe lung mass.  PRIOR THERAPY:  1) Status post stereotactic radiotherapy to a solitary right parietal brain lesion under the care of Dr. [name] on 10/16/2013. 2) Status post palliative radiotherapy to the right lung tumor under the care of Dr. [name] completed on 12/05/2013. 3) Systemic chemotherapy with carboplatin for AUC of 5 and Alimta 500 mg/M2 every 3 weeks. First dose Jan 06 2014. Status post 6 cycles.  CURRENT THERAPY: Systemic chemotherapy with maintenance Alimta 500 mg/M2 every 3 weeks. Status post 5 cycles.  INTERVAL HISTORY: Alexis [name] 64 y.o. female returns to the clinic today for followup visit accompanied by her husband. The patient is tolerating her maintenance chemotherapy with Alimta fairly well. She's had some mild  mouth sores and requests a prescription for Magic mouthwash. She also requests refills for her dexamethasone and Xanax. She continues to tolerate her maintenance Alimta without difficulty. She denied having any significant chest pain, shortness of breath, cough or hemoptysis. She has no fever or chills, no significant nausea or vomiting. She does have some occasional nausea that is well managed by her current antiemetic She denied having any significant weight loss or night sweats.   MEDICAL HISTORY: Past Medical History  Diagnosis Date  . Ileus, postoperative 11/18/2013  . Physical deconditioning 11/18/2013  . Severe protein-calorie malnutrition  11/18/2013  . Anemia 11/18/2013  . History of radiation therapy 10-28-13- 12-05-13    lung ca 50 Gy/58f  . Anxiety   . 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 Hx of radiation therapy 10/16/13    rt parietal brain/20Gy    ALLERGIES:  is allergic to codeine.  MEDICATIONS:  Current Outpatient Prescriptions  Medication Sig Dispense Refill  . acetaminophen (TYLENOL) 500 MG tablet Take 500 mg by mouth every 6 (six) hours as needed for mild pain or headache.    . ALPRAZolam (XANAX) 0.25 MG tablet Take 1 tablet (0.25 mg total) by mouth 3 (three) times daily as needed for anxiety. 45 tablet 0  . Ascorbic Acid (VITAMIN C GUMMIE PO) Take 1 each by mouth every morning.    .Marland Kitchendexamethasone (DECADRON) 4 MG tablet 4 mg by mouth twice a day the day before, day of and day after the chemotherapy every 3 weeks 40 tablet 1  . folic acid (FOLVITE) 1 MG tablet Take 1 tablet (1 mg total) by mouth daily. 30 tablet 2  . Multiple Vitamin (MULTIVITAMIN WITH MINERALS) TABS tablet Take 1 tablet by mouth every morning.    .Marland KitchenOVER THE COUNTER MEDICATION Take 1 tablet by mouth every morning. (Vitamin A)    . Zinc 50 MG TABS Take 1 tablet by mouth every morning.    . Alum & Mag Hydroxide-Simeth (MAGIC MOUTHWASH) SOLN Take 5 mLs by mouth 4 (four) times daily as needed for mouth pain. 160 mL 0  . ondansetron (ZOFRAN) 8 MG tablet Take 1 tablet (8 mg total) by mouth every 8 (  eight) hours as needed for nausea or vomiting. (Patient not taking: Reported on 09/29/2014) 30 tablet 0  . oxyCODONE (OXY IR/ROXICODONE) 5 MG immediate release tablet Take 1-2 tablets (5-10 mg total) by mouth every 4 (four) hours as needed for moderate pain. (Patient not taking: Reported on 09/29/2014) 40 tablet 0  . prochlorperazine (COMPAZINE) 10 MG tablet Take 1 tablet (10 mg total) by mouth every 6 (six) hours as needed for nausea or vomiting. (Patient not taking: Reported on 09/29/2014) 60 tablet 0   No current  facility-administered medications for this visit.    SURGICAL HISTORY:  Past Surgical History  Procedure Laterality Date  . Video bronchoscopy Bilateral 08/30/2013    Procedure: VIDEO BRONCHOSCOPY WITH FLUORO;  Surgeon: Tanda Rockers, MD;  Location: [facility];  Service: Cardiopulmonary;  Laterality: Bilateral;  . Abdominal hysterectomy    . Laparotomy N/A 11/03/2013    Procedure: EXPLORATORY LAPAROTOMY, DRAINAGE OF INTRA  ABDOMINAL ABSCESSES, MOBILIZATION OF SPLENIC FLEXURE, SIGMOID COLECTOMY WITH COLOSTOMY;  Surgeon: Odis Hollingshead, MD;  Location: [facility];  Service: General;  Laterality: N/A;  . Colostomy takedown N/A 07/10/2014    Procedure: LAPAROSCOPIC LYSIS OF ADHESIONS (90 MIN) LAPAROSCOPIC ASSISTED COLOSTOMY CLOSURE, RIGID PROCTOSIGMOIDOSCOPY;  Surgeon: Jackolyn Confer, MD;  Location: [facility];  Service: General;  Laterality: N/A;    REVIEW OF SYSTEMS:  Constitutional: positive for fatigue Eyes: negative Ears, nose, mouth, throat, and face: negative Respiratory: negative Cardiovascular: negative Gastrointestinal: positive for nausea Genitourinary:negative Integument/breast: negative Hematologic/lymphatic: negative Musculoskeletal:negative Neurological: positive for weakness Behavioral/Psych: positive for anxiety and depression Endocrine: negative Allergic/Immunologic: negative   PHYSICAL EXAMINATION: General appearance: alert, cooperative, fatigued and no distress Head: Normocephalic, without obvious abnormality, atraumatic Neck: no adenopathy, no JVD, supple, symmetrical, trachea midline and thyroid not enlarged, symmetric, no tenderness/mass/nodules Lymph nodes: Cervical, supraclavicular, and axillary nodes normal. Resp: clear to auscultation bilaterally Back: symmetric, no curvature. ROM normal. No CVA tenderness. Cardio: regular rate and rhythm, S1, S2 normal, no murmur, click, rub or gallop GI: soft, non-tender; bowel sounds normal; no masses,  no  organomegaly Extremities: extremities normal, atraumatic, no cyanosis or edema Neurologic: Alert and oriented X 3, normal strength and tone. Normal symmetric reflexes. Normal coordination and gait Mouth: Reveals resolving mild mucositis  ECOG PERFORMANCE STATUS: 1 - Symptomatic but completely ambulatory  Blood pressure 117/56, pulse 71, temperature 98.5 F (36.9 C), temperature source Oral, resp. rate 18, height _0  (1.626 m), weight 126 lb 4.8 oz (57.289 kg).  LABORATORY DATA: Lab Results  Component Value Date   WBC 8.4 09/29/2014   HGB 11.2* 09/29/2014   HCT 35.5 09/29/2014   MCV 99.2 09/29/2014   PLT 325 09/29/2014      Chemistry      Component Value Date/Time   NA 142 09/29/2014 1009   NA 139 07/12/2014 0512   K 4.1 09/29/2014 1009   K 3.9 07/12/2014 0512   CL 100 07/12/2014 0512   CO2 24 09/29/2014 1009   CO2 32 07/12/2014 0512   BUN 17.5 09/29/2014 1009   BUN 5* 07/12/2014 0512   CREATININE 0.8 09/29/2014 1009   CREATININE 0.55 07/12/2014 0512      Component Value Date/Time   CALCIUM 9.6 09/29/2014 1009   CALCIUM 9.0 07/12/2014 0512   ALKPHOS 84 09/29/2014 1009   ALKPHOS 91 07/10/2014 0955   AST 12 09/29/2014 1009   AST 15 07/10/2014 0955   ALT 11 09/29/2014 1009   ALT 8 07/10/2014 0955   BILITOT 0.39 09/29/2014 1009  BILITOT 0.4 07/10/2014 0955       RADIOGRAPHIC STUDIES: No results found.  ASSESSMENT AND PLAN: This is a very pleasant 64 years old white female recently diagnosed with a stage IV non-small cell lung cancer, adenocarcinoma with negative EGFR mutation and negative ALK gene translocation. The patient is status post stereotactic radiotherapy to a solitary brain metastases in addition to palliative radiotherapy to the right upper lobe lung mass. She status post 6 cycles of systemic chemotherapy with carboplatin and Alimta with improvement in her disease and she is currently undergoing maintenance chemotherapy with single agent Alimta status  post 3 cycles and tolerating her treatment fairly well. The recent CT scan of the chest, abdomen and pelvis showed no evidence for disease progression. Patient was discussed with and also seen by Dr. Julien Nordmann. She'll proceed with cycle #6 today as scheduled. She will follow-up in 3 weeks prior to cycle #7 with a restaging CT scan of her chest, abdomen and pelvis with contrast to reevaluate her disease. For history of depression, the patient will continue on Remeron 30 mg by mouth each bedtime. For anxiety, she is currently on Xanax 0.25 mg by mouth 3 times a day as needed. She was given a refill prescription for Xanax tablets. He prescription for Magic mouthwash to address the mucositis and a refill for her dexamethasone was sent her pharmacy of record via E scribe. The patient was advised to call immediately if she has any concerning symptoms in the interval. The patient voices understanding of current disease status and treatment options and is in agreement with the current care plan.  All questions were answered. The patient knows to call the clinic with any problems, questions or concerns. We can certainly see the patient much sooner if necessary.  Carlton Adam, PA-C 09/29/2014  ADDENDUM: Hematology/Oncology Attending: I had a face to face encounter with the patient. I recommended her care plan. This is a very pleasant 64 years old white female with stage IV non-small cell lung cancer status post induction chemotherapy with carboplatin and Alimta and currently undergoing maintenance Alimta status post 5 cycles. The patient is tolerating her maintenance treatment fairly well with no significant adverse effects. I recommended for her to proceed with cycle #6 today as a scheduled. The patient would come back for follow-up visit in 3 weeks for evaluation after repeating CT scan of the chest, abdomen and pelvis for restaging of her disease. She will continue on her current medication with Xanax  for anxiety. She was advised to call immediately if she has any concerning symptoms in the interval.  Disclaimer: This note was dictated with voice recognition software. Similar sounding words can inadvertently be transcribed and may be missed upon review. Eilleen Kempf., MD 09/30/2014

## 2014-09-30 NOTE — Patient Instructions (Signed)
Follow-up in 3 weeks with restaging CT scan of your chest, abdomen and pelvis to reevaluate your disease

## 2014-10-17 ENCOUNTER — Encounter (HOSPITAL_COMMUNITY): Payer: Self-pay

## 2014-10-17 ENCOUNTER — Ambulatory Visit (HOSPITAL_COMMUNITY)
Admission: RE | Admit: 2014-10-17 | Discharge: 2014-10-17 | Disposition: A | Discharge status: Home / Self Care | Payer: BLUE CROSS/BLUE SHIELD | Source: Ambulatory Visit | Attending: Physician Assistant | Admitting: Physician Assistant

## 2014-10-17 DIAGNOSIS — Z9221 Personal history of antineoplastic chemotherapy: Secondary | ICD-10-CM | POA: Insufficient documentation

## 2014-10-17 DIAGNOSIS — C3411 Malignant neoplasm of upper lobe, right bronchus or lung: Secondary | ICD-10-CM | POA: Diagnosis present

## 2014-10-17 DIAGNOSIS — Z923 Personal history of irradiation: Secondary | ICD-10-CM | POA: Insufficient documentation

## 2014-10-17 MED ORDER — IOHEXOL 300 MG/ML  SOLN
100.0000 mL | Freq: Once | INTRAMUSCULAR | Status: AC | PRN
  Administered 2014-10-17: 100 mL via INTRAVENOUS

## 2014-10-20 ENCOUNTER — Ambulatory Visit: Payer: BC Managed Care – PPO

## 2014-10-20 ENCOUNTER — Ambulatory Visit (HOSPITAL_BASED_OUTPATIENT_CLINIC_OR_DEPARTMENT_OTHER): Payer: BLUE CROSS/BLUE SHIELD

## 2014-10-20 ENCOUNTER — Ambulatory Visit (HOSPITAL_BASED_OUTPATIENT_CLINIC_OR_DEPARTMENT_OTHER): Payer: BLUE CROSS/BLUE SHIELD | Admitting: Physician Assistant

## 2014-10-20 ENCOUNTER — Other Ambulatory Visit: Payer: Self-pay | Admitting: *Deleted

## 2014-10-20 ENCOUNTER — Other Ambulatory Visit (HOSPITAL_BASED_OUTPATIENT_CLINIC_OR_DEPARTMENT_OTHER): Payer: BLUE CROSS/BLUE SHIELD

## 2014-10-20 ENCOUNTER — Telehealth: Payer: Self-pay | Admitting: Internal Medicine

## 2014-10-20 ENCOUNTER — Encounter: Payer: Self-pay | Admitting: Physician Assistant

## 2014-10-20 ENCOUNTER — Telehealth: Payer: Self-pay | Admitting: *Deleted

## 2014-10-20 VITALS — BP 126/56 | HR 84 | Temp 98.3°F | Resp 18 | Ht 64.0 in | Wt 130.8 lb

## 2014-10-20 DIAGNOSIS — C7931 Secondary malignant neoplasm of brain: Secondary | ICD-10-CM

## 2014-10-20 DIAGNOSIS — C3410 Malignant neoplasm of upper lobe, unspecified bronchus or lung: Secondary | ICD-10-CM

## 2014-10-20 DIAGNOSIS — C3411 Malignant neoplasm of upper lobe, right bronchus or lung: Secondary | ICD-10-CM

## 2014-10-20 DIAGNOSIS — F418 Other specified anxiety disorders: Secondary | ICD-10-CM

## 2014-10-20 DIAGNOSIS — Z5111 Encounter for antineoplastic chemotherapy: Secondary | ICD-10-CM

## 2014-10-20 LAB — COMPREHENSIVE METABOLIC PANEL (CC13)
ALT: 9 U/L (ref 0–55)
AST: 11 U/L (ref 5–34)
Albumin: 3.7 g/dL (ref 3.5–5.0)
Alkaline Phosphatase: 88 U/L (ref 40–150)
Anion Gap: 10 mEq/L (ref 3–11)
BUN: 11.2 mg/dL (ref 7.0–26.0)
CO2: 24 mEq/L (ref 22–29)
Calcium: 9.8 mg/dL (ref 8.4–10.4)
Chloride: 107 mEq/L (ref 98–109)
Creatinine: 0.8 mg/dL (ref 0.6–1.1)
EGFR: 85 mL/min/{1.73_m2} — ABNORMAL LOW (ref >90)
Glucose: 125 mg/dl (ref 70–140)
Potassium: 3.8 mEq/L (ref 3.5–5.1)
Sodium: 141 mEq/L (ref 136–145)
Total Bilirubin: 0.33 mg/dL (ref 0.20–1.20)
Total Protein: 7.2 g/dL (ref 6.4–8.3)

## 2014-10-20 LAB — CBC WITH DIFFERENTIAL/PLATELET
BASO%: 0.2 % (ref 0.0–2.0)
Basophils Absolute: 0 10*3/uL (ref 0.0–0.1)
EOS%: 0 % (ref 0.0–7.0)
Eosinophils Absolute: 0 10*3/uL (ref 0.0–0.5)
HCT: 35.8 % (ref 34.8–46.6)
HGB: 11.7 g/dL (ref 11.6–15.9)
LYMPH%: 3.4 % — ABNORMAL LOW (ref 14.0–49.7)
MCH: 31.9 pg (ref 25.1–34.0)
MCHC: 32.7 g/dL (ref 31.5–36.0)
MCV: 97.8 fL (ref 79.5–101.0)
MONO#: 0.6 10*3/uL (ref 0.1–0.9)
MONO%: 5.8 % (ref 0.0–14.0)
NEUT#: 9.2 10*3/uL — ABNORMAL HIGH (ref 1.5–6.5)
NEUT%: 90.6 % — ABNORMAL HIGH (ref 38.4–76.8)
Platelets: 331 10*3/uL (ref 145–400)
RBC: 3.66 10*6/uL — ABNORMAL LOW (ref 3.70–5.45)
RDW: 16.8 % — ABNORMAL HIGH (ref 11.2–14.5)
WBC: 10.1 10*3/uL (ref 3.9–10.3)
lymph#: 0.3 10*3/uL — ABNORMAL LOW (ref 0.9–3.3)

## 2014-10-20 MED ORDER — SODIUM CHLORIDE 0.9 % IV SOLN
500.0000 mg/m2 | Freq: Once | INTRAVENOUS | Status: AC
  Administered 2014-10-20: 800 mg via INTRAVENOUS
  Filled 2014-10-20: qty 32

## 2014-10-20 MED ORDER — ONDANSETRON 8 MG/NS 50 ML IVPB
INTRAVENOUS | Status: AC
  Filled 2014-10-20: qty 8

## 2014-10-20 MED ORDER — SODIUM CHLORIDE 0.9 % IV SOLN
Freq: Once | INTRAVENOUS | Status: AC
Start: 2014-10-20 — End: 2014-10-20
  Administered 2014-10-20: 12:00:00 via INTRAVENOUS

## 2014-10-20 MED ORDER — ALPRAZOLAM 0.25 MG PO TABS
0.2500 mg | ORAL_TABLET | Freq: Three times a day (TID) | ORAL | Status: DC | PRN
Start: 2014-10-20 — End: 2014-11-10

## 2014-10-20 MED ORDER — DEXAMETHASONE SODIUM PHOSPHATE 10 MG/ML IJ SOLN
10.0000 mg | Freq: Once | INTRAMUSCULAR | Status: AC
  Administered 2014-10-20: 10 mg via INTRAVENOUS

## 2014-10-20 MED ORDER — ONDANSETRON 8 MG/50ML IVPB (CHCC)
8.0000 mg | Freq: Once | INTRAVENOUS | Status: AC
  Administered 2014-10-20: 8 mg via INTRAVENOUS

## 2014-10-20 MED ORDER — DEXAMETHASONE SODIUM PHOSPHATE 10 MG/ML IJ SOLN
INTRAMUSCULAR | Status: AC
  Filled 2014-10-20: qty 1

## 2014-10-20 NOTE — Progress Notes (Addendum)
[place] [phone]   Fax:([phone]) [place], MD [address] [place] [address]  DIAGNOSIS: Stage IV (T2a, N0, M1b) non-small cell lung cancer consistent with adenocarcinoma with negative EGFR mutation and negative ALK gene translocation diagnosed in January of 2015 presented with right upper lobe lung mass in addition to a solitary brain metastasis status post stereotactic to a solitary brain metastasis as well as radiotherapy to the right upper lobe lung mass.  PRIOR THERAPY:  1) Status post stereotactic radiotherapy to a solitary right parietal brain lesion under the care of Dr. [name] on 10/16/2013. 2) Status post palliative radiotherapy to the right lung tumor under the care of Dr. [name] completed on 12/05/2013. 3) Systemic chemotherapy with carboplatin for AUC of 5 and Alimta 500 mg/M2 every 3 weeks. First dose Jan 06 2014. Status post 6 cycles.  CURRENT THERAPY: Systemic chemotherapy with maintenance Alimta 500 mg/M2 every 3 weeks. Status post 6 cycles.  INTERVAL HISTORY: Alexis [name] 64 y.o. female returns to the clinic today for followup visit accompanied by her husband. The patient is tolerating her maintenance chemotherapy with Alimta fairly well.  She  Requests a refill for her Xanax. She continues to tolerate her maintenance Alimta without difficulty. She denied having any significant chest pain, shortness of breath, cough or hemoptysis. She has no fever or chills, no significant nausea or vomiting. She does have some occasional nausea that is well managed by her current antiemetic She denied having any significant weight loss or night sweats. She recently had a restaging CT scan of the chest, abdomen and pelvis and presents to discuss the results.  MEDICAL HISTORY: Past Medical History  Diagnosis Date  . Ileus, postoperative 11/18/2013  . Physical deconditioning 11/18/2013  . Severe protein-calorie  malnutrition 11/18/2013  . Anemia 11/18/2013  . History of radiation therapy 10-28-13- 12-05-13    lung ca 50 Gy/5f  . Anxiety   . Hx of radiation therapy 10/16/13    rt parietal brain/20Gy  . 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  MEDICATIONS:  Current Outpatient Prescriptions  Medication Sig Dispense Refill  . acetaminophen (TYLENOL) 500 MG tablet Take 500 mg by mouth every 6 (six) hours as needed for mild pain or headache.    . Alum & Mag Hydroxide-Simeth (MAGIC MOUTHWASH) SOLN Take 5 mLs by mouth 4 (four) times daily as needed for mouth pain. 160 mL 0  . Ascorbic Acid (VITAMIN C GUMMIE PO) Take 1 each by mouth every morning.    .Marland Kitchendexamethasone (DECADRON) 4 MG tablet 4 mg by mouth twice a day the day before, day of and day after the chemotherapy every 3 weeks 40 tablet 1  . folic acid (FOLVITE) 1 MG tablet Take 1 tablet (1 mg total) by mouth daily. 30 tablet 2  . Multiple Vitamin (MULTIVITAMIN WITH MINERALS) TABS tablet Take 1 tablet by mouth every morning.    . ondansetron (ZOFRAN) 8 MG tablet Take 1 tablet (8 mg total) by mouth every 8 (eight) hours as needed for nausea or vomiting. 30 tablet 0  . OVER THE COUNTER MEDICATION Take 1 tablet by mouth every morning. (Vitamin A)    . Zinc 50 MG TABS Take 1 tablet by mouth every morning.    .Marland KitchenALPRAZolam (XANAX) 0.25 MG tablet Take 1 tablet (0.25  mg total) by mouth 3 (three) times daily as needed for anxiety. 45 tablet 0  . oxyCODONE (OXY IR/ROXICODONE) 5 MG immediate release tablet Take 1-2 tablets (5-10 mg total) by mouth every 4 (four) hours as needed for moderate pain. (Patient not taking: Reported on 10/20/2014) 40 tablet 0  . prochlorperazine (COMPAZINE) 10 MG tablet Take 1 tablet (10 mg total) by mouth every 6 (six) hours as needed for nausea or vomiting. (Patient not taking: Reported on 10/20/2014) 60 tablet 0   No current facility-administered medications  for this visit.    SURGICAL HISTORY:  Past Surgical History  Procedure Laterality Date  . Video bronchoscopy Bilateral 08/30/2013    Procedure: VIDEO BRONCHOSCOPY WITH FLUORO;  Surgeon: Tanda Rockers, MD;  Location: [facility];  Service: Cardiopulmonary;  Laterality: Bilateral;  . Abdominal hysterectomy    . Laparotomy N/A 11/03/2013    Procedure: EXPLORATORY LAPAROTOMY, DRAINAGE OF INTRA  ABDOMINAL ABSCESSES, MOBILIZATION OF SPLENIC FLEXURE, SIGMOID COLECTOMY WITH COLOSTOMY;  Surgeon: Odis Hollingshead, MD;  Location: [facility];  Service: General;  Laterality: N/A;  . Colostomy takedown N/A 07/10/2014    Procedure: LAPAROSCOPIC LYSIS OF ADHESIONS (90 MIN) LAPAROSCOPIC ASSISTED COLOSTOMY CLOSURE, RIGID PROCTOSIGMOIDOSCOPY;  Surgeon: Jackolyn Confer, MD;  Location: [facility];  Service: General;  Laterality: N/A;    REVIEW OF SYSTEMS:  Constitutional: positive for fatigue Eyes: negative Ears, nose, mouth, throat, and face: negative Respiratory: negative Cardiovascular: negative Gastrointestinal: positive for nausea Genitourinary:negative Integument/breast: negative Hematologic/lymphatic: negative Musculoskeletal:negative Neurological: positive for weakness Behavioral/Psych: positive for anxiety and depression Endocrine: negative Allergic/Immunologic: negative   PHYSICAL EXAMINATION: General appearance: alert, cooperative, fatigued and no distress Head: Normocephalic, without obvious abnormality, atraumatic Neck: no adenopathy, no JVD, supple, symmetrical, trachea midline and thyroid not enlarged, symmetric, no tenderness/mass/nodules Lymph nodes: Cervical, supraclavicular, and axillary nodes normal. Resp: clear to auscultation bilaterally Back: symmetric, no curvature. ROM normal. No CVA tenderness. Cardio: regular rate and rhythm, S1, S2 normal, no murmur, click, rub or gallop GI: soft, non-tender; bowel sounds normal; no masses,  no organomegaly Extremities: extremities normal,  atraumatic, no cyanosis or edema Neurologic: Alert and oriented X 3, normal strength and tone. Normal symmetric reflexes. Normal coordination and gait Mouth: Reveals resolving mild mucositis  ECOG PERFORMANCE STATUS: 1 - Symptomatic but completely ambulatory  Blood pressure 126/56, pulse 84, temperature 98.3 F (36.8 C), temperature source Oral, resp. rate 18, height 5' 4"  (1.626 m), weight 130 lb 12.8 oz (59.33 kg), SpO2 100 %.  LABORATORY DATA: Lab Results  Component Value Date   WBC 10.1 10/20/2014   HGB 11.7 10/20/2014   HCT 35.8 10/20/2014   MCV 97.8 10/20/2014   PLT 331 10/20/2014      Chemistry      Component Value Date/Time   NA 141 10/20/2014 1006   NA 139 07/12/2014 0512   K 3.8 10/20/2014 1006   K 3.9 07/12/2014 0512   CL 100 07/12/2014 0512   CO2 24 10/20/2014 1006   CO2 32 07/12/2014 0512   BUN 11.2 10/20/2014 1006   BUN 5* 07/12/2014 0512   CREATININE 0.8 10/20/2014 1006   CREATININE 0.55 07/12/2014 0512      Component Value Date/Time   CALCIUM 9.8 10/20/2014 1006   CALCIUM 9.0 07/12/2014 0512   ALKPHOS 88 10/20/2014 1006   ALKPHOS 91 07/10/2014 0955   AST 11 10/20/2014 1006   AST 15 07/10/2014 0955   ALT 9 10/20/2014 1006   ALT 8 07/10/2014 0955   BILITOT 0.33 10/20/2014 1006  BILITOT 0.4 07/10/2014 0955       RADIOGRAPHIC STUDIES: Ct Chest W Contrast  10/17/2014   CLINICAL DATA:  Right lung cancer diagnosed in December 2014. History of chemotherapy and radiation therapy.  EXAM: CT CHEST, ABDOMEN, AND PELVIS WITH CONTRAST  TECHNIQUE: Multidetector CT imaging of the chest, abdomen and pelvis was performed following the standard protocol during bolus administration of intravenous contrast.  CONTRAST:  145m OMNIPAQUE IOHEXOL 300 MG/ML  SOLN  COMPARISON:  08/15/2014  FINDINGS: CT CHEST FINDINGS  Chest wall: No breast masses, supraclavicular or axillary lymphadenopathy. The thyroid gland is normal. A small left lobe nodule is noted. The bony thorax is  intact. No destructive bone lesions or spinal canal compromise.  Mediastinum: The heart is normal in size. No pericardial effusion. No mediastinal or hilar mass or adenopathy. Small scattered lymph nodes are stable. The right hilar node is unchanged at 9 mm. Stable aortic calcifications. No aneurysm or dissection. The esophagus is grossly normal.  Lungs/pleura: Stable right upper lobe pulmonary lesion which measures 22 x 15 mm on image 15. Remeasured at the same level on the prior study it measured 22 x 14 mm. Stable surrounding emphysematous changes and pulmonary scarring. No new pulmonary nodules or acute pulmonary findings. No pleural effusion.  CT ABDOMEN AND PELVIS FINDINGS  Hepatobiliary: No focal hepatic lesions or intrahepatic biliary dilatation. Mild diffuse fatty infiltration. The gallbladder is normal. No common bile duct dilatation.  Pancreas: Normal.  Spleen: Normal.  Adrenals/Urinary Tract: Normal.  Stomach/Bowel: The stomach, duodenum, small bowel and colon are unremarkable. No inflammatory changes, mass lesions or obstructive findings. The terminal ileum is normal. The appendix is normal.  Vascular/Lymphatic: No mesenteric or retroperitoneal mass or adenopathy. Stable atherosclerotic changes involving the aorta and iliac arteries. The branch vessels are patent. The major venous structures are patent.  Reproductive: Status post hysterectomy. No pelvic mass or adenopathy. No free pelvic fluid collections. The bladder is unremarkable. No inguinal mass or adenopathy.  Other: No abdominal wall hernia or subcutaneous lesions.  Musculoskeletal: No significant bony findings.  IMPRESSION: Stable CT appearance of the chest. The right upper lobe pulmonary lesion is unchanged. Small scattered mediastinal and hilar lymph nodes unchanged.  No new pulmonary regions.  No findings for metastatic disease involving the abdomen/pelvis or osseous structures.   Electronically Signed   By: PMarijo SanesM.D.   On:  10/17/2014 11:39   Ct Abdomen Pelvis W Contrast  10/17/2014   CLINICAL DATA:  Right lung cancer diagnosed in December 2014. History of chemotherapy and radiation therapy.  EXAM: CT CHEST, ABDOMEN, AND PELVIS WITH CONTRAST  TECHNIQUE: Multidetector CT imaging of the chest, abdomen and pelvis was performed following the standard protocol during bolus administration of intravenous contrast.  CONTRAST:  1019mOMNIPAQUE IOHEXOL 300 MG/ML  SOLN  COMPARISON:  08/15/2014  FINDINGS: CT CHEST FINDINGS  Chest wall: No breast masses, supraclavicular or axillary lymphadenopathy. The thyroid gland is normal. A small left lobe nodule is noted. The bony thorax is intact. No destructive bone lesions or spinal canal compromise.  Mediastinum: The heart is normal in size. No pericardial effusion. No mediastinal or hilar mass or adenopathy. Small scattered lymph nodes are stable. The right hilar node is unchanged at 9 mm. Stable aortic calcifications. No aneurysm or dissection. The esophagus is grossly normal.  Lungs/pleura: Stable right upper lobe pulmonary lesion which measures 22 x 15 mm on image 15. Remeasured at the same level on the prior study it measured 22 x 14  mm. Stable surrounding emphysematous changes and pulmonary scarring. No new pulmonary nodules or acute pulmonary findings. No pleural effusion.  CT ABDOMEN AND PELVIS FINDINGS  Hepatobiliary: No focal hepatic lesions or intrahepatic biliary dilatation. Mild diffuse fatty infiltration. The gallbladder is normal. No common bile duct dilatation.  Pancreas: Normal.  Spleen: Normal.  Adrenals/Urinary Tract: Normal.  Stomach/Bowel: The stomach, duodenum, small bowel and colon are unremarkable. No inflammatory changes, mass lesions or obstructive findings. The terminal ileum is normal. The appendix is normal.  Vascular/Lymphatic: No mesenteric or retroperitoneal mass or adenopathy. Stable atherosclerotic changes involving the aorta and iliac arteries. The branch vessels are  patent. The major venous structures are patent.  Reproductive: Status post hysterectomy. No pelvic mass or adenopathy. No free pelvic fluid collections. The bladder is unremarkable. No inguinal mass or adenopathy.  Other: No abdominal wall hernia or subcutaneous lesions.  Musculoskeletal: No significant bony findings.  IMPRESSION: Stable CT appearance of the chest. The right upper lobe pulmonary lesion is unchanged. Small scattered mediastinal and hilar lymph nodes unchanged.  No new pulmonary regions.  No findings for metastatic disease involving the abdomen/pelvis or osseous structures.   Electronically Signed   By: Marijo Sanes M.D.   On: 10/17/2014 11:39    ASSESSMENT AND PLAN: This is a very pleasant 65 years old white female recently diagnosed with a stage IV non-small cell lung cancer, adenocarcinoma with negative EGFR mutation and negative ALK gene translocation. The patient is status post stereotactic radiotherapy to a solitary brain metastases in addition to palliative radiotherapy to the right upper lobe lung mass. She status post 6 cycles of systemic chemotherapy with carboplatin and Alimta with improvement in her disease and she is currently undergoing maintenance chemotherapy with single agent Alimta status post 3 cycles and tolerating her treatment fairly well. The recent CT scan of the chest, abdomen and pelvis showed stable disease with no evidence for disease progression. Patient was discussed with and also seen by Dr. Julien Nordmann. She'll proceed with cycle #7 today as scheduled. She will follow-up in 3 weeks prior to cycle #8.  For history of depression, the patient will continue on Remeron 30 mg by mouth each bedtime. For anxiety, she is currently on Xanax 0.25 mg by mouth 3 times a day as needed. She was given a refill prescription for Xanax tablets.  The patient was advised to call immediately if she has any concerning symptoms in the interval. The patient voices understanding of  current disease status and treatment options and is in agreement with the current care plan.  All questions were answered. The patient knows to call the clinic with any problems, questions or concerns. We can certainly see the patient much sooner if necessary.  Carlton Adam, PA-C 10/20/2014  ADDENDUM: Hematology/Oncology Attending: I had a face to face encounter with the patient. I recommended her care plan. This is a very pleasant 64 years old white female with metastatic non-small cell lung cancer, adenocarcinoma status post induction chemotherapy with carboplatin and Alimta and currently undergoing maintenance chemotherapy with single agent Alimta status post 6 cycles. The patient is tolerating her treatment with Alimta fairly well with no significant adverse effects. The recent CT scan of the chest, abdomen and pelvis showed no evidence for disease progression. I discussed the scan results with the patient and her husband. I recommended for her to continue her current treatment with maintenance chemotherapy with Alimta. She will proceed with cycle #7 today. The patient would come back for follow-up visit  in 3 weeks with the next cycle of her treatment. She would continue on treatment with Remeron for depression and the next for anxiety. The patient was advised to call immediately if she has any concerning symptoms in the interval.  Disclaimer: This note was dictated with voice recognition software. Similar sounding words can inadvertently be transcribed and may be missed upon review. Eilleen Kempf., MD 10/25/2014

## 2014-10-20 NOTE — Telephone Encounter (Signed)
Pt confirmed labs/ov per 02/22 POF, gave pt AVS... KJ, sent msg to add chemo and move chemo down

## 2014-10-20 NOTE — Telephone Encounter (Signed)
I have adjusted 3/14 appt

## 2014-10-20 NOTE — Patient Instructions (Signed)
[facility] Discharge Instructions for Patients Receiving Chemotherapy  Today you received the following chemotherapy agents: Alimta.  To help prevent nausea and vomiting after your treatment, we encourage you to take your nausea medication as prescribed.   If you develop nausea and vomiting that is not controlled by your nausea medication, call the clinic.   BELOW ARE SYMPTOMS THAT SHOULD BE REPORTED IMMEDIATELY:  *FEVER GREATER THAN 100.5 F  *CHILLS WITH OR WITHOUT FEVER  NAUSEA AND VOMITING THAT IS NOT CONTROLLED WITH YOUR NAUSEA MEDICATION  *UNUSUAL SHORTNESS OF BREATH  *UNUSUAL BRUISING OR BLEEDING  TENDERNESS IN MOUTH AND THROAT WITH OR WITHOUT PRESENCE OF ULCERS  *URINARY PROBLEMS  *BOWEL PROBLEMS  UNUSUAL RASH Items with * indicate a potential emergency and should be followed up as soon as possible.  Feel free to call the clinic you have any questions or concerns. The clinic phone number is [phone].

## 2014-10-24 NOTE — Patient Instructions (Signed)
Your restaging CT scan revealed stable disease Follow-up in 3 weeks prior to your next scheduled cycle of maintenance chemotherapy

## 2014-11-07 ENCOUNTER — Ambulatory Visit
Admission: RE | Admit: 2014-11-07 | Discharge: 2014-11-07 | Disposition: A | Discharge status: Home / Self Care | Payer: BLUE CROSS/BLUE SHIELD | Source: Ambulatory Visit | Attending: Radiation Oncology | Admitting: Radiation Oncology

## 2014-11-07 DIAGNOSIS — C7931 Secondary malignant neoplasm of brain: Secondary | ICD-10-CM

## 2014-11-07 MED ORDER — GADOBENATE DIMEGLUMINE 529 MG/ML IV SOLN
11.0000 mL | Freq: Once | INTRAVENOUS | Status: AC | PRN
  Administered 2014-11-07: 11 mL via INTRAVENOUS

## 2014-11-10 ENCOUNTER — Encounter: Payer: Self-pay | Admitting: Radiation Oncology

## 2014-11-10 ENCOUNTER — Telehealth: Payer: Self-pay | Admitting: *Deleted

## 2014-11-10 ENCOUNTER — Other Ambulatory Visit: Payer: Self-pay | Admitting: *Deleted

## 2014-11-10 ENCOUNTER — Encounter: Payer: Self-pay | Admitting: Oncology

## 2014-11-10 ENCOUNTER — Ambulatory Visit (HOSPITAL_BASED_OUTPATIENT_CLINIC_OR_DEPARTMENT_OTHER): Payer: BLUE CROSS/BLUE SHIELD | Admitting: Oncology

## 2014-11-10 ENCOUNTER — Ambulatory Visit
Admission: RE | Admit: 2014-11-10 | Discharge: 2014-11-10 | Disposition: A | Discharge status: Home / Self Care | Payer: BLUE CROSS/BLUE SHIELD | Source: Ambulatory Visit | Attending: Radiation Oncology | Admitting: Radiation Oncology

## 2014-11-10 ENCOUNTER — Other Ambulatory Visit (HOSPITAL_BASED_OUTPATIENT_CLINIC_OR_DEPARTMENT_OTHER): Payer: BLUE CROSS/BLUE SHIELD

## 2014-11-10 ENCOUNTER — Ambulatory Visit (HOSPITAL_BASED_OUTPATIENT_CLINIC_OR_DEPARTMENT_OTHER): Payer: BLUE CROSS/BLUE SHIELD

## 2014-11-10 VITALS — BP 122/47 | HR 80 | Temp 98.2°F | Resp 18 | Ht 64.0 in | Wt 132.2 lb

## 2014-11-10 DIAGNOSIS — R300 Dysuria: Secondary | ICD-10-CM

## 2014-11-10 DIAGNOSIS — Z5111 Encounter for antineoplastic chemotherapy: Secondary | ICD-10-CM

## 2014-11-10 DIAGNOSIS — C7931 Secondary malignant neoplasm of brain: Secondary | ICD-10-CM

## 2014-11-10 DIAGNOSIS — C3411 Malignant neoplasm of upper lobe, right bronchus or lung: Secondary | ICD-10-CM

## 2014-11-10 DIAGNOSIS — C3410 Malignant neoplasm of upper lobe, unspecified bronchus or lung: Secondary | ICD-10-CM

## 2014-11-10 LAB — CBC WITH DIFFERENTIAL/PLATELET
BASO%: 0.2 % (ref 0.0–2.0)
Basophils Absolute: 0 10*3/uL (ref 0.0–0.1)
EOS%: 0 % (ref 0.0–7.0)
Eosinophils Absolute: 0 10*3/uL (ref 0.0–0.5)
HCT: 35.6 % (ref 34.8–46.6)
HGB: 11.3 g/dL — ABNORMAL LOW (ref 11.6–15.9)
LYMPH%: 3.8 % — ABNORMAL LOW (ref 14.0–49.7)
MCH: 31.6 pg (ref 25.1–34.0)
MCHC: 31.9 g/dL (ref 31.5–36.0)
MCV: 98.9 fL (ref 79.5–101.0)
MONO#: 0.2 10*3/uL (ref 0.1–0.9)
MONO%: 2.4 % (ref 0.0–14.0)
NEUT#: 7.6 10*3/uL — ABNORMAL HIGH (ref 1.5–6.5)
NEUT%: 93.6 % — ABNORMAL HIGH (ref 38.4–76.8)
Platelets: 309 10*3/uL (ref 145–400)
RBC: 3.59 10*6/uL — ABNORMAL LOW (ref 3.70–5.45)
RDW: 16.8 % — ABNORMAL HIGH (ref 11.2–14.5)
WBC: 8.1 10*3/uL (ref 3.9–10.3)
lymph#: 0.3 10*3/uL — ABNORMAL LOW (ref 0.9–3.3)

## 2014-11-10 LAB — COMPREHENSIVE METABOLIC PANEL (CC13)
ALT: 13 U/L (ref 0–55)
AST: 12 U/L (ref 5–34)
Albumin: 3.9 g/dL (ref 3.5–5.0)
Alkaline Phosphatase: 85 U/L (ref 40–150)
Anion Gap: 12 mEq/L — ABNORMAL HIGH (ref 3–11)
BUN: 14.6 mg/dL (ref 7.0–26.0)
CO2: 25 mEq/L (ref 22–29)
Calcium: 9.7 mg/dL (ref 8.4–10.4)
Chloride: 103 mEq/L (ref 98–109)
Creatinine: 0.8 mg/dL (ref 0.6–1.1)
EGFR: 81 mL/min/{1.73_m2} — ABNORMAL LOW (ref >90)
Glucose: 142 mg/dl — ABNORMAL HIGH (ref 70–140)
Potassium: 3.9 mEq/L (ref 3.5–5.1)
Sodium: 139 mEq/L (ref 136–145)
Total Bilirubin: 0.39 mg/dL (ref 0.20–1.20)
Total Protein: 7.5 g/dL (ref 6.4–8.3)

## 2014-11-10 LAB — URINALYSIS, MICROSCOPIC - CHCC
Bilirubin (Urine): NEGATIVE
Glucose: NEGATIVE mg/dL
Ketones: NEGATIVE mg/dL
Leukocyte Esterase: NEGATIVE
Nitrite: NEGATIVE
Protein: NEGATIVE mg/dL
Specific Gravity, Urine: 1.01 (ref 1.003–1.035)
Urobilinogen, UR: 0.2 mg/dL (ref 0.2–1)
pH: 5 (ref 4.6–8.0)

## 2014-11-10 MED ORDER — SODIUM CHLORIDE 0.9 % IV SOLN
Freq: Once | INTRAVENOUS | Status: AC
  Administered 2014-11-10: 11:00:00 via INTRAVENOUS

## 2014-11-10 MED ORDER — DEXAMETHASONE SODIUM PHOSPHATE 100 MG/10ML IJ SOLN
Freq: Once | INTRAMUSCULAR | Status: AC
  Administered 2014-11-10: 11:00:00 via INTRAVENOUS
  Filled 2014-11-10: qty 4

## 2014-11-10 MED ORDER — SODIUM CHLORIDE 0.9 % IV SOLN
500.0000 mg/m2 | Freq: Once | INTRAVENOUS | Status: AC
  Administered 2014-11-10: 800 mg via INTRAVENOUS
  Filled 2014-11-10: qty 32

## 2014-11-10 MED ORDER — ALPRAZOLAM 0.25 MG PO TABS: 0.2500 mg | ORAL_TABLET | Freq: Three times a day (TID) | ORAL | Status: DC | PRN

## 2014-11-10 MED ORDER — PENTOXIFYLLINE ER 400 MG PO TBCR: 400.0000 mg | EXTENDED_RELEASE_TABLET | Freq: Two times a day (BID) | ORAL | Status: DC

## 2014-11-10 NOTE — Progress Notes (Signed)
Follow up Alexis [name] brain,  MR brain results from 11/07/14, no c/o pain, headaches, dizziness , appetite good, has labs, infusion today Alimta, and Kristin Curcio,NP fer this , 100% room air, vitals wnl\ 8:21 AM

## 2014-11-10 NOTE — Telephone Encounter (Signed)
-----   Message from Maryanna Shape, NP sent at 11/10/2014 12:41 PM EDT ----- Please call pt and let her know that the U/A does not show an obvious UTI. It is being sent for culture and we will call her only if there is a UTI that needs to be treated.

## 2014-11-10 NOTE — Addendum Note (Signed)
Encounter addended by: Kyung Rudd, MD on: 11/10/2014 10:48 AM<BR>     Documentation filed: Orders

## 2014-11-10 NOTE — Progress Notes (Signed)
Radiation Oncology         [phone]) [phone] ________________________________  Name: Alexis [name] MRN: [id number]  Date: 11/10/2014  DOB: 1951-01-28  Follow-Up Visit Note  CC: [name], MD  [name], MD  Diagnosis:   Metastatic lung cancer with brain metastasis  Interval Since Last Radiation:  Radiosurgery to a right parietal target completed on 10/16/2013   Narrative:  The patient returns today for routine follow-up.  The patient states that she is doing well. She denies any headaches or nausea. No dizziness. She continues with systemic treatment through Dr. Earlie Server and this has gone well. She had a recent MRI scan of the brain which was reviewed in brain conference today.                              ALLERGIES:  is allergic to codeine.  Meds: Current Outpatient Prescriptions  Medication Sig Dispense Refill  . acetaminophen (TYLENOL) 500 MG tablet Take 500 mg by mouth every 6 (six) hours as needed for mild pain or headache.    . ALPRAZolam (XANAX) 0.25 MG tablet Take 1 tablet (0.25 mg total) by mouth 3 (three) times daily as needed for anxiety. 45 tablet 0  . Alum & Mag Hydroxide-Simeth (MAGIC MOUTHWASH) SOLN Take 5 mLs by mouth 4 (four) times daily as needed for mouth pain. 160 mL 0  . Ascorbic Acid (VITAMIN C GUMMIE PO) Take 1 each by mouth every morning.    Marland Kitchen dexamethasone (DECADRON) 4 MG tablet 4 mg by mouth twice a day the day before, day of and day after the chemotherapy every 3 weeks 40 tablet 1  . folic acid (FOLVITE) 1 MG tablet Take 1 tablet (1 mg total) by mouth daily. 30 tablet 2  . Multiple Vitamin (MULTIVITAMIN WITH MINERALS) TABS tablet Take 1 tablet by mouth every morning.    . ondansetron (ZOFRAN) 8 MG tablet Take 1 tablet (8 mg total) by mouth every 8 (eight) hours as needed for nausea or vomiting. 30 tablet 0  . OVER THE COUNTER MEDICATION Take 1 tablet by mouth every morning. (Vitamin A)    . oxyCODONE (OXY IR/ROXICODONE) 5 MG immediate release tablet Take  1-2 tablets (5-10 mg total) by mouth every 4 (four) hours as needed for moderate pain. 40 tablet 0  . prochlorperazine (COMPAZINE) 10 MG tablet Take 1 tablet (10 mg total) by mouth every 6 (six) hours as needed for nausea or vomiting. 60 tablet 0  . Zinc 50 MG TABS Take 1 tablet by mouth every morning.     No current facility-administered medications for this encounter.    Physical Findings: The patient is in no acute distress. Patient is alert and oriented.  vitals were not taken for this visit..     Lab Findings: Lab Results  Component Value Date   WBC 8.1 11/10/2014   HGB 11.3* 11/10/2014   HCT 35.6 11/10/2014   MCV 98.9 11/10/2014   PLT 309 11/10/2014     Radiographic Findings: Ct Chest W Contrast  10/17/2014   CLINICAL DATA:  Right lung cancer diagnosed in December 2014. History of chemotherapy and radiation therapy.  EXAM: CT CHEST, ABDOMEN, AND PELVIS WITH CONTRAST  TECHNIQUE: Multidetector CT imaging of the chest, abdomen and pelvis was performed following the standard protocol during bolus administration of intravenous contrast.  CONTRAST:  180mL OMNIPAQUE IOHEXOL 300 MG/ML  SOLN  COMPARISON:  08/15/2014  FINDINGS: CT CHEST FINDINGS  Chest  wall: No breast masses, supraclavicular or axillary lymphadenopathy. The thyroid gland is normal. A small left lobe nodule is noted. The bony thorax is intact. No destructive bone lesions or spinal canal compromise.  Mediastinum: The heart is normal in size. No pericardial effusion. No mediastinal or hilar mass or adenopathy. Small scattered lymph nodes are stable. The right hilar node is unchanged at 9 mm. Stable aortic calcifications. No aneurysm or dissection. The esophagus is grossly normal.  Lungs/pleura: Stable right upper lobe pulmonary lesion which measures 22 x 15 mm on image 15. Remeasured at the same level on the prior study it measured 22 x 14 mm. Stable surrounding emphysematous changes and pulmonary scarring. No new pulmonary nodules  or acute pulmonary findings. No pleural effusion.  CT ABDOMEN AND PELVIS FINDINGS  Hepatobiliary: No focal hepatic lesions or intrahepatic biliary dilatation. Mild diffuse fatty infiltration. The gallbladder is normal. No common bile duct dilatation.  Pancreas: Normal.  Spleen: Normal.  Adrenals/Urinary Tract: Normal.  Stomach/Bowel: The stomach, duodenum, small bowel and colon are unremarkable. No inflammatory changes, mass lesions or obstructive findings. The terminal ileum is normal. The appendix is normal.  Vascular/Lymphatic: No mesenteric or retroperitoneal mass or adenopathy. Stable atherosclerotic changes involving the aorta and iliac arteries. The branch vessels are patent. The major venous structures are patent.  Reproductive: Status post hysterectomy. No pelvic mass or adenopathy. No free pelvic fluid collections. The bladder is unremarkable. No inguinal mass or adenopathy.  Other: No abdominal wall hernia or subcutaneous lesions.  Musculoskeletal: No significant bony findings.  IMPRESSION: Stable CT appearance of the chest. The right upper lobe pulmonary lesion is unchanged. Small scattered mediastinal and hilar lymph nodes unchanged.  No new pulmonary regions.  No findings for metastatic disease involving the abdomen/pelvis or osseous structures.   Electronically Signed   By: Marijo Sanes M.D.   On: 10/17/2014 11:39   Mr [name] OE Contrast  11/07/2014   CLINICAL DATA:  Metastatic lung cancer. SRS to a right parietal brain lesion in 09/2013. Continuing systemic chemotherapy.  EXAM: MRI HEAD WITHOUT AND WITH CONTRAST  TECHNIQUE: Multiplanar, multiecho pulse sequences of the brain and surrounding structures were obtained without and with intravenous contrast.  CONTRAST:  48mL MULTIHANCE GADOBENATE DIMEGLUMINE 529 MG/ML IV SOLN  COMPARISON:  07/30/2014  FINDINGS: There is no evidence of acute infarct, intracranial hemorrhage, midline shift, or extra-axial fluid collection. Mild cerebral atrophy is  unchanged. An incidental, small right frontal developmental venous anomaly is noted.  Peripherally enhancing right parietal lesion is slightly larger than on the prior study, measuring 13 x 8 mm (series 10, image 107, previously 10 x 6 mm). Small amount of surrounding T2 hyperintensity is unchanged. No new enhancing lesions are identified.  Orbits are unremarkable. Focal left ethmoid air cell opacification is unchanged. Mastoid air cells are clear. Major intracranial vascular flow voids are preserved. Small, nonenhancing left parietal scalp lesion is unchanged.  IMPRESSION: Minimally increased size of right parietal lesion. No evidence of new metastases.   Electronically Signed   By: Logan Bores   On: 11/07/2014 11:25   Ct Abdomen Pelvis W Contrast  10/17/2014   CLINICAL DATA:  Right lung cancer diagnosed in December 2014. History of chemotherapy and radiation therapy.  EXAM: CT CHEST, ABDOMEN, AND PELVIS WITH CONTRAST  TECHNIQUE: Multidetector CT imaging of the chest, abdomen and pelvis was performed following the standard protocol during bolus administration of intravenous contrast.  CONTRAST:  152mL OMNIPAQUE IOHEXOL 300 MG/ML  SOLN  COMPARISON:  08/15/2014  FINDINGS: CT CHEST FINDINGS  Chest wall: No breast masses, supraclavicular or axillary lymphadenopathy. The thyroid gland is normal. A small left lobe nodule is noted. The bony thorax is intact. No destructive bone lesions or spinal canal compromise.  Mediastinum: The heart is normal in size. No pericardial effusion. No mediastinal or hilar mass or adenopathy. Small scattered lymph nodes are stable. The right hilar node is unchanged at 9 mm. Stable aortic calcifications. No aneurysm or dissection. The esophagus is grossly normal.  Lungs/pleura: Stable right upper lobe pulmonary lesion which measures 22 x 15 mm on image 15. Remeasured at the same level on the prior study it measured 22 x 14 mm. Stable surrounding emphysematous changes and pulmonary  scarring. No new pulmonary nodules or acute pulmonary findings. No pleural effusion.  CT ABDOMEN AND PELVIS FINDINGS  Hepatobiliary: No focal hepatic lesions or intrahepatic biliary dilatation. Mild diffuse fatty infiltration. The gallbladder is normal. No common bile duct dilatation.  Pancreas: Normal.  Spleen: Normal.  Adrenals/Urinary Tract: Normal.  Stomach/Bowel: The stomach, duodenum, small bowel and colon are unremarkable. No inflammatory changes, mass lesions or obstructive findings. The terminal ileum is normal. The appendix is normal.  Vascular/Lymphatic: No mesenteric or retroperitoneal mass or adenopathy. Stable atherosclerotic changes involving the aorta and iliac arteries. The branch vessels are patent. The major venous structures are patent.  Reproductive: Status post hysterectomy. No pelvic mass or adenopathy. No free pelvic fluid collections. The bladder is unremarkable. No inguinal mass or adenopathy.  Other: No abdominal wall hernia or subcutaneous lesions.  Musculoskeletal: No significant bony findings.  IMPRESSION: Stable CT appearance of the chest. The right upper lobe pulmonary lesion is unchanged. Small scattered mediastinal and hilar lymph nodes unchanged.  No new pulmonary regions.  No findings for metastatic disease involving the abdomen/pelvis or osseous structures.   Electronically Signed   By: Marijo Sanes M.D.   On: 10/17/2014 11:39    Impression:    The patient's MRI scan showed slight enlargement of the treated tumor with some increase in edema. She has no symptoms related to this currently.  Plan:  I discussed the possible implications of this with the patient. We discussed possible treatment options with the various scenarios. At this time, we will begin vitamin E/Trental and we will repeat the patient's brain MRI scan in 2 months.  I spent 15 minutes with the patient today, the majority of which was spent counseling the patient on the diagnosis of cancer and coordinating  care.   Jodelle Gross, M.D., Ph.D.

## 2014-11-10 NOTE — Progress Notes (Signed)
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  PRIOR THERAPY:  1) Status post stereotactic radiotherapy to a solitary right parietal brain lesion under the care of Dr. [name] on 10/16/2013. 2) Status post palliative radiotherapy to the right lung tumor under the care of Dr. [name] completed on 12/05/2013. 3) Systemic chemotherapy with carboplatin for AUC of 5 and Alimta 500 mg/M2 every 3 weeks. First dose Jan 06 2014. Status post 6 cycles.  CURRENT THERAPY: Systemic chemotherapy with maintenance Alimta 500 mg/M2 every 3 weeks. Status post 7 cycles.  INTERVAL HISTORY: Alexis [name] 64 y.o. female returns to the clinic today for followup visit accompanied by her husband. The patient is tolerating her maintenance chemotherapy with Alimta fairly well.  She  Requests a refill for her Xanax. She continues to tolerate her maintenance Alimta without difficulty. She denied having any significant chest pain, shortness of breath, cough or hemoptysis. She has no fever or chills, no significant nausea or vomiting. She does have some occasional nausea that is well managed by her current antiemetic She denied having any significant weight loss or night sweats. Reports intermittent foul smelling urine with occasional dysuria. No fevers or back pain. No hematuria. She had a recent MRI of brain to follow-up on her brain lesion.  MEDICAL HISTORY: Past Medical History  Diagnosis Date  . Ileus, postoperative 11/18/2013  . Physical  deconditioning 11/18/2013  . Severe protein-calorie malnutrition 11/18/2013  . Anemia 11/18/2013  . History of radiation therapy 10-28-13- 12-05-13    lung ca 50 Gy/27f  . Anxiety   . Hx of radiation therapy 10/16/13    rt parietal brain/20Gy  . 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  MEDICATIONS:  Current Outpatient Prescriptions  Medication Sig Dispense Refill  . acetaminophen (TYLENOL) 500 MG tablet Take 500 mg by mouth every 6 (six) hours as needed for mild pain or headache.    . ALPRAZolam (XANAX) 0.25 MG tablet Take 1 tablet (0.25 mg total) by mouth 3 (three) times daily as needed for anxiety. 45 tablet 0  . Alum & Mag Hydroxide-Simeth (MAGIC MOUTHWASH) SOLN Take 5 mLs by mouth 4 (four) times daily as needed for mouth pain. 160 mL 0  . Ascorbic Acid (VITAMIN C GUMMIE PO) Take 1 each by mouth every morning.    .Marland Kitchendexamethasone (DECADRON) 4 MG tablet 4 mg by mouth twice a day the day before, day of and day after the chemotherapy every 3 weeks 40 tablet 1  . folic acid (FOLVITE) 1 MG tablet Take 1 tablet (1 mg total) by mouth daily. 30 tablet 2  . Multiple Vitamin (MULTIVITAMIN WITH MINERALS) TABS tablet Take 1 tablet by mouth every morning.    . ondansetron (ZOFRAN) 8 MG tablet Take 1 tablet (8 mg total) by mouth every 8 (eight) hours as needed for nausea or vomiting. 30 tablet 0  . OVER THE COUNTER MEDICATION Take 1  tablet by mouth every morning. (Vitamin A)    . oxyCODONE (OXY IR/ROXICODONE) 5 MG immediate release tablet Take 1-2 tablets (5-10 mg total) by mouth every 4 (four) hours as needed for moderate pain. 40 tablet 0  . prochlorperazine (COMPAZINE) 10 MG tablet Take 1 tablet (10 mg total) by mouth every 6 (six) hours as needed for nausea or vomiting. 60 tablet 0  . Zinc 50 MG TABS Take 1 tablet by mouth every morning.     No current facility-administered medications for this visit.    SURGICAL  HISTORY:  Past Surgical History  Procedure Laterality Date  . Video bronchoscopy Bilateral 08/30/2013    Procedure: VIDEO BRONCHOSCOPY WITH FLUORO;  Surgeon: Tanda Rockers, MD;  Location: [facility];  Service: Cardiopulmonary;  Laterality: Bilateral;  . Abdominal hysterectomy    . Laparotomy N/A 11/03/2013    Procedure: EXPLORATORY LAPAROTOMY, DRAINAGE OF INTRA  ABDOMINAL ABSCESSES, MOBILIZATION OF SPLENIC FLEXURE, SIGMOID COLECTOMY WITH COLOSTOMY;  Surgeon: Odis Hollingshead, MD;  Location: [facility];  Service: General;  Laterality: N/A;  . Colostomy takedown N/A 07/10/2014    Procedure: LAPAROSCOPIC LYSIS OF ADHESIONS (90 MIN) LAPAROSCOPIC ASSISTED COLOSTOMY CLOSURE, RIGID PROCTOSIGMOIDOSCOPY;  Surgeon: Jackolyn Confer, MD;  Location: [facility];  Service: General;  Laterality: N/A;    REVIEW OF SYSTEMS:  Constitutional: positive for fatigue Eyes: negative Ears, nose, mouth, throat, and face: negative Respiratory: negative Cardiovascular: negative Gastrointestinal: positive for nausea Genitourinary:negative Integument/breast: negative Hematologic/lymphatic: negative Musculoskeletal:negative Neurological: positive for weakness Behavioral/Psych: positive for anxiety and depression Endocrine: negative Allergic/Immunologic: negative   PHYSICAL EXAMINATION: General appearance: alert, cooperative, fatigued and no distress Head: Normocephalic, without obvious abnormality, atraumatic Neck: no adenopathy, no JVD, supple, symmetrical, trachea midline and thyroid not enlarged, symmetric, no tenderness/mass/nodules Lymph nodes: Cervical, supraclavicular, and axillary nodes normal. Resp: clear to auscultation bilaterally Back: symmetric, no curvature. ROM normal. No CVA tenderness. Cardio: regular rate and rhythm, S1, S2 normal, no murmur, click, rub or gallop GI: soft, non-tender; bowel sounds normal; no masses,  no organomegaly Extremities: extremities normal, atraumatic, no cyanosis or  edema Neurologic: Alert and oriented X 3, normal strength and tone. Normal symmetric reflexes. Normal coordination and gait Mouth: Reveals resolving mild mucositis  ECOG PERFORMANCE STATUS: 1 - Symptomatic but completely ambulatory  Blood pressure 122/47, pulse 80, temperature 98.2 F (36.8 C), temperature source Oral, resp. rate 18, height _0  (1.626 m), weight 132 lb 3.2 oz (59.966 kg).  LABORATORY DATA: Lab Results  Component Value Date   WBC 8.1 11/10/2014   HGB 11.3* 11/10/2014   HCT 35.6 11/10/2014   MCV 98.9 11/10/2014   PLT 309 11/10/2014      Chemistry      Component Value Date/Time   NA 141 10/20/2014 1006   NA 139 07/12/2014 0512   K 3.8 10/20/2014 1006   K 3.9 07/12/2014 0512   CL 100 07/12/2014 0512   CO2 24 10/20/2014 1006   CO2 32 07/12/2014 0512   BUN 11.2 10/20/2014 1006   BUN 5* 07/12/2014 0512   CREATININE 0.8 10/20/2014 1006   CREATININE 0.55 07/12/2014 0512      Component Value Date/Time   CALCIUM 9.8 10/20/2014 1006   CALCIUM 9.0 07/12/2014 0512   ALKPHOS 88 10/20/2014 1006   ALKPHOS 91 07/10/2014 0955   AST 11 10/20/2014 1006   AST 15 07/10/2014 0955   ALT 9 10/20/2014 1006   ALT 8 07/10/2014 0955   BILITOT 0.33 10/20/2014 1006   BILITOT 0.4 07/10/2014 0955  RADIOGRAPHIC STUDIES: Ct Chest W Contrast  10/17/2014   CLINICAL DATA:  Right lung cancer diagnosed in December 2014. History of chemotherapy and radiation therapy.  EXAM: CT CHEST, ABDOMEN, AND PELVIS WITH CONTRAST  TECHNIQUE: Multidetector CT imaging of the chest, abdomen and pelvis was performed following the standard protocol during bolus administration of intravenous contrast.  CONTRAST:  154m OMNIPAQUE IOHEXOL 300 MG/ML  SOLN  COMPARISON:  08/15/2014  FINDINGS: CT CHEST FINDINGS  Chest wall: No breast masses, supraclavicular or axillary lymphadenopathy. The thyroid gland is normal. A small left lobe nodule is noted. The bony thorax is intact. No destructive bone lesions or  spinal canal compromise.  Mediastinum: The heart is normal in size. No pericardial effusion. No mediastinal or hilar mass or adenopathy. Small scattered lymph nodes are stable. The right hilar node is unchanged at 9 mm. Stable aortic calcifications. No aneurysm or dissection. The esophagus is grossly normal.  Lungs/pleura: Stable right upper lobe pulmonary lesion which measures 22 x 15 mm on image 15. Remeasured at the same level on the prior study it measured 22 x 14 mm. Stable surrounding emphysematous changes and pulmonary scarring. No new pulmonary nodules or acute pulmonary findings. No pleural effusion.  CT ABDOMEN AND PELVIS FINDINGS  Hepatobiliary: No focal hepatic lesions or intrahepatic biliary dilatation. Mild diffuse fatty infiltration. The gallbladder is normal. No common bile duct dilatation.  Pancreas: Normal.  Spleen: Normal.  Adrenals/Urinary Tract: Normal.  Stomach/Bowel: The stomach, duodenum, small bowel and colon are unremarkable. No inflammatory changes, mass lesions or obstructive findings. The terminal ileum is normal. The appendix is normal.  Vascular/Lymphatic: No mesenteric or retroperitoneal mass or adenopathy. Stable atherosclerotic changes involving the aorta and iliac arteries. The branch vessels are patent. The major venous structures are patent.  Reproductive: Status post hysterectomy. No pelvic mass or adenopathy. No free pelvic fluid collections. The bladder is unremarkable. No inguinal mass or adenopathy.  Other: No abdominal wall hernia or subcutaneous lesions.  Musculoskeletal: No significant bony findings.  IMPRESSION: Stable CT appearance of the chest. The right upper lobe pulmonary lesion is unchanged. Small scattered mediastinal and hilar lymph nodes unchanged.  No new pulmonary regions.  No findings for metastatic disease involving the abdomen/pelvis or osseous structures.   Electronically Signed   By: PMarijo SanesM.D.   On: 10/17/2014 11:39   Mr BJeri CosWIO Contrast  11/07/2014   CLINICAL DATA:  Metastatic lung cancer. SRS to a right parietal brain lesion in 09/2013. Continuing systemic chemotherapy.  EXAM: MRI HEAD WITHOUT AND WITH CONTRAST  TECHNIQUE: Multiplanar, multiecho pulse sequences of the brain and surrounding structures were obtained without and with intravenous contrast.  CONTRAST:  187mMULTIHANCE GADOBENATE DIMEGLUMINE 529 MG/ML IV SOLN  COMPARISON:  07/30/2014  FINDINGS: There is no evidence of acute infarct, intracranial hemorrhage, midline shift, or extra-axial fluid collection. Mild cerebral atrophy is unchanged. An incidental, small right frontal developmental venous anomaly is noted.  Peripherally enhancing right parietal lesion is slightly larger than on the prior study, measuring 13 x 8 mm (series 10, image 107, previously 10 x 6 mm). Small amount of surrounding T2 hyperintensity is unchanged. No new enhancing lesions are identified.  Orbits are unremarkable. Focal left ethmoid air cell opacification is unchanged. Mastoid air cells are clear. Major intracranial vascular flow voids are preserved. Small, nonenhancing left parietal scalp lesion is unchanged.  IMPRESSION: Minimally increased size of right parietal lesion. No evidence of new metastases.   Electronically Signed   By: AlZenia Resides  Grady   On: 11/07/2014 11:25   Ct Abdomen Pelvis W Contrast  10/17/2014   CLINICAL DATA:  Right lung cancer diagnosed in December 2014. History of chemotherapy and radiation therapy.  EXAM: CT CHEST, ABDOMEN, AND PELVIS WITH CONTRAST  TECHNIQUE: Multidetector CT imaging of the chest, abdomen and pelvis was performed following the standard protocol during bolus administration of intravenous contrast.  CONTRAST:  197m OMNIPAQUE IOHEXOL 300 MG/ML  SOLN  COMPARISON:  08/15/2014  FINDINGS: CT CHEST FINDINGS  Chest wall: No breast masses, supraclavicular or axillary lymphadenopathy. The thyroid gland is normal. A small left lobe nodule is noted. The bony thorax is  intact. No destructive bone lesions or spinal canal compromise.  Mediastinum: The heart is normal in size. No pericardial effusion. No mediastinal or hilar mass or adenopathy. Small scattered lymph nodes are stable. The right hilar node is unchanged at 9 mm. Stable aortic calcifications. No aneurysm or dissection. The esophagus is grossly normal.  Lungs/pleura: Stable right upper lobe pulmonary lesion which measures 22 x 15 mm on image 15. Remeasured at the same level on the prior study it measured 22 x 14 mm. Stable surrounding emphysematous changes and pulmonary scarring. No new pulmonary nodules or acute pulmonary findings. No pleural effusion.  CT ABDOMEN AND PELVIS FINDINGS  Hepatobiliary: No focal hepatic lesions or intrahepatic biliary dilatation. Mild diffuse fatty infiltration. The gallbladder is normal. No common bile duct dilatation.  Pancreas: Normal.  Spleen: Normal.  Adrenals/Urinary Tract: Normal.  Stomach/Bowel: The stomach, duodenum, small bowel and colon are unremarkable. No inflammatory changes, mass lesions or obstructive findings. The terminal ileum is normal. The appendix is normal.  Vascular/Lymphatic: No mesenteric or retroperitoneal mass or adenopathy. Stable atherosclerotic changes involving the aorta and iliac arteries. The branch vessels are patent. The major venous structures are patent.  Reproductive: Status post hysterectomy. No pelvic mass or adenopathy. No free pelvic fluid collections. The bladder is unremarkable. No inguinal mass or adenopathy.  Other: No abdominal wall hernia or subcutaneous lesions.  Musculoskeletal: No significant bony findings.  IMPRESSION: Stable CT appearance of the chest. The right upper lobe pulmonary lesion is unchanged. Small scattered mediastinal and hilar lymph nodes unchanged.  No new pulmonary regions.  No findings for metastatic disease involving the abdomen/pelvis or osseous structures.   Electronically Signed   By: PMarijo SanesM.D.   On:  10/17/2014 11:39    ASSESSMENT AND PLAN: This is a very pleasant 64year old white female recently diagnosed with a stage IV non-small cell lung cancer, adenocarcinoma with negative EGFR mutation and negative ALK gene translocation. The patient is status post stereotactic radiotherapy to a solitary brain metastases in addition to palliative radiotherapy to the right upper lobe lung mass. She status post 6 cycles of systemic chemotherapy with carboplatin and Alimta with improvement in her disease and she is currently undergoing maintenance chemotherapy with single agent Alimta status post 3 cycles and tolerating her treatment fairly well. The recent CT scan of the chest, abdomen and pelvis showed stable disease with no evidence for disease progression.  Patient was discussed with and also seen by Dr. MJulien Nordmann She'll proceed with cycle #8 today as scheduled. She will follow-up in 3 weeks prior to cycle #9.  For history of depression, the patient will continue on Remeron 30 mg by mouth each bedtime. For anxiety, she is currently on Xanax 0.25 mg by mouth 3 times a day as needed. She was given a refill prescription for Xanax tablets.  Given her  report of intermittent dysuria and foul-smelling urine, we will check a urinalysis on her today.  The patient was advised to call immediately if she has any concerning symptoms in the interval. The patient voices understanding of current disease status and treatment options and is in agreement with the current care plan.  All questions were answered. The patient knows to call the clinic with any problems, questions or concerns. We can certainly see the patient much sooner if necessary.  Mikey Bussing, DNP, AGPCNP-BC, AOCNP 11/10/2014  ADDENDUM: Hematology/Oncology Attending: I had a face to face encounter with the patient today. I recommended her care plan. This is a very pleasant 64 years old white female with a stage IV non-small cell lung cancer  currently undergoing maintenance chemotherapy with single agent Alimta status post 7 cycles. She is tolerating her treatment well with no significant adverse effects. I recommended for the patient to proceed with cycle #8 today as scheduled. She will come back for follow-up visit in 3 weeks for reevaluation before starting cycle #9. She recently had MRI of the brain that showed questionable tumor necrosis and she was started on Trental and vitamin E by Dr. [name]. The patient was advised to call immediately if she has any concerning symptoms in the interval.  Disclaimer: This note was dictated with voice recognition software. Similar sounding words can inadvertently be transcribed and may be missed upon review. Eilleen Kempf., MD 11/10/2014

## 2014-11-10 NOTE — Progress Notes (Signed)
Spoke with patient, U/A done today was negative, still waiting on culture and sensitivity, encouraged to push fluids, cranberry juice, take azo OTC for burning.

## 2014-11-10 NOTE — Patient Instructions (Signed)
[facility] Discharge Instructions for Patients Receiving Chemotherapy  Today you received the following chemotherapy agents alimta  To help prevent nausea and vomiting after your treatment, we encourage you to take your nausea medication as directed   If you develop nausea and vomiting that is not controlled by your nausea medication, call the clinic.   BELOW ARE SYMPTOMS THAT SHOULD BE REPORTED IMMEDIATELY:  *FEVER GREATER THAN 100.5 F  *CHILLS WITH OR WITHOUT FEVER  NAUSEA AND VOMITING THAT IS NOT CONTROLLED WITH YOUR NAUSEA MEDICATION  *UNUSUAL SHORTNESS OF BREATH  *UNUSUAL BRUISING OR BLEEDING  TENDERNESS IN MOUTH AND THROAT WITH OR WITHOUT PRESENCE OF ULCERS  *URINARY PROBLEMS  *BOWEL PROBLEMS  UNUSUAL RASH Items with * indicate a potential emergency and should be followed up as soon as possible.  Feel free to call the clinic you have any questions or concerns. The clinic phone number is [phone].

## 2014-11-12 LAB — URINE CULTURE

## 2014-11-14 ENCOUNTER — Other Ambulatory Visit: Payer: Self-pay | Admitting: Oncology

## 2014-11-14 MED ORDER — CIPROFLOXACIN HCL 500 MG PO TABS: 500.0000 mg | ORAL_TABLET | Freq: Two times a day (BID) | ORAL | Status: DC

## 2014-11-14 NOTE — Progress Notes (Signed)
Urine culture results received. Pt has >100,000 colonies of E coli. Spoke with patient by phone to discuss results. She still has dysuria. Confirmed allergy to Codeine, but not allergic to any antibiotics. E-scribed Rx for Cipro 500 mg BID X 7 days to her pharmacy. Pt instructed to take entire course of antibiotics.

## 2014-11-27 ENCOUNTER — Other Ambulatory Visit: Payer: Self-pay | Admitting: Radiation Therapy

## 2014-11-27 DIAGNOSIS — C7931 Secondary malignant neoplasm of brain: Secondary | ICD-10-CM

## 2014-12-01 ENCOUNTER — Encounter: Payer: Self-pay | Admitting: Internal Medicine

## 2014-12-01 ENCOUNTER — Telehealth: Payer: Self-pay | Admitting: Internal Medicine

## 2014-12-01 ENCOUNTER — Ambulatory Visit (HOSPITAL_BASED_OUTPATIENT_CLINIC_OR_DEPARTMENT_OTHER): Payer: BLUE CROSS/BLUE SHIELD | Admitting: Internal Medicine

## 2014-12-01 ENCOUNTER — Other Ambulatory Visit (HOSPITAL_BASED_OUTPATIENT_CLINIC_OR_DEPARTMENT_OTHER): Payer: BLUE CROSS/BLUE SHIELD

## 2014-12-01 ENCOUNTER — Ambulatory Visit (HOSPITAL_BASED_OUTPATIENT_CLINIC_OR_DEPARTMENT_OTHER): Payer: BLUE CROSS/BLUE SHIELD

## 2014-12-01 VITALS — BP 106/47 | HR 66 | Temp 98.0°F | Resp 18 | Ht 64.0 in | Wt 139.9 lb

## 2014-12-01 DIAGNOSIS — C7931 Secondary malignant neoplasm of brain: Secondary | ICD-10-CM

## 2014-12-01 DIAGNOSIS — C3410 Malignant neoplasm of upper lobe, unspecified bronchus or lung: Secondary | ICD-10-CM

## 2014-12-01 DIAGNOSIS — Z5111 Encounter for antineoplastic chemotherapy: Secondary | ICD-10-CM

## 2014-12-01 DIAGNOSIS — C7949 Secondary malignant neoplasm of other parts of nervous system: Secondary | ICD-10-CM

## 2014-12-01 DIAGNOSIS — C3411 Malignant neoplasm of upper lobe, right bronchus or lung: Secondary | ICD-10-CM | POA: Diagnosis not present

## 2014-12-01 DIAGNOSIS — F418 Other specified anxiety disorders: Secondary | ICD-10-CM | POA: Diagnosis not present

## 2014-12-01 LAB — CBC WITH DIFFERENTIAL/PLATELET
BASO%: 0.3 % (ref 0.0–2.0)
Basophils Absolute: 0 10*3/uL (ref 0.0–0.1)
EOS%: 0 % (ref 0.0–7.0)
Eosinophils Absolute: 0 10*3/uL (ref 0.0–0.5)
HCT: 36.2 % (ref 34.8–46.6)
HGB: 11.7 g/dL (ref 11.6–15.9)
LYMPH%: 4.1 % — ABNORMAL LOW (ref 14.0–49.7)
MCH: 32.1 pg (ref 25.1–34.0)
MCHC: 32.2 g/dL (ref 31.5–36.0)
MCV: 99.6 fL (ref 79.5–101.0)
MONO#: 0.3 10*3/uL (ref 0.1–0.9)
MONO%: 3.7 % (ref 0.0–14.0)
NEUT#: 7.8 10*3/uL — ABNORMAL HIGH (ref 1.5–6.5)
NEUT%: 91.9 % — ABNORMAL HIGH (ref 38.4–76.8)
Platelets: 342 10*3/uL (ref 145–400)
RBC: 3.64 10*6/uL — ABNORMAL LOW (ref 3.70–5.45)
RDW: 15.2 % — ABNORMAL HIGH (ref 11.2–14.5)
WBC: 8.5 10*3/uL (ref 3.9–10.3)
lymph#: 0.4 10*3/uL — ABNORMAL LOW (ref 0.9–3.3)

## 2014-12-01 LAB — COMPREHENSIVE METABOLIC PANEL (CC13)
ALT: 7 U/L (ref 0–55)
AST: 10 U/L (ref 5–34)
Albumin: 3.8 g/dL (ref 3.5–5.0)
Alkaline Phosphatase: 83 U/L (ref 40–150)
Anion Gap: 14 mEq/L — ABNORMAL HIGH (ref 3–11)
BUN: 13.5 mg/dL (ref 7.0–26.0)
CO2: 24 mEq/L (ref 22–29)
Calcium: 9.6 mg/dL (ref 8.4–10.4)
Chloride: 106 mEq/L (ref 98–109)
Creatinine: 0.8 mg/dL (ref 0.6–1.1)
EGFR: 83 mL/min/{1.73_m2} — ABNORMAL LOW (ref >90)
Glucose: 125 mg/dl (ref 70–140)
Potassium: 3.7 mEq/L (ref 3.5–5.1)
Sodium: 143 mEq/L (ref 136–145)
Total Bilirubin: 0.37 mg/dL (ref 0.20–1.20)
Total Protein: 7.1 g/dL (ref 6.4–8.3)

## 2014-12-01 MED ORDER — SODIUM CHLORIDE 0.9 % IV SOLN
500.0000 mg/m2 | Freq: Once | INTRAVENOUS | Status: AC
  Administered 2014-12-01: 800 mg via INTRAVENOUS
  Filled 2014-12-01: qty 32

## 2014-12-01 MED ORDER — SODIUM CHLORIDE 0.9 % IV SOLN
Freq: Once | INTRAVENOUS | Status: AC
  Administered 2014-12-01: 11:00:00 via INTRAVENOUS

## 2014-12-01 MED ORDER — CYANOCOBALAMIN 1000 MCG/ML IJ SOLN
1000.0000 ug | Freq: Once | INTRAMUSCULAR | Status: AC
  Administered 2014-12-01: 1000 ug via INTRAMUSCULAR

## 2014-12-01 MED ORDER — ALPRAZOLAM 0.25 MG PO TABS
0.2500 mg | ORAL_TABLET | Freq: Three times a day (TID) | ORAL | Status: DC | PRN
Start: 2014-12-01 — End: 2014-12-22

## 2014-12-01 MED ORDER — CYANOCOBALAMIN 1000 MCG/ML IJ SOLN
INTRAMUSCULAR | Status: AC
  Filled 2014-12-01: qty 1

## 2014-12-01 MED ORDER — SODIUM CHLORIDE 0.9 % IV SOLN
Freq: Once | INTRAVENOUS | Status: AC
  Administered 2014-12-01: 11:00:00 via INTRAVENOUS
  Filled 2014-12-01: qty 4

## 2014-12-01 MED ORDER — ALPRAZOLAM 0.25 MG PO TABS: 0.2500 mg | ORAL_TABLET | Freq: Three times a day (TID) | ORAL | Status: DC | PRN

## 2014-12-01 MED ORDER — FOLIC ACID 1 MG PO TABS: 1.0000 mg | ORAL_TABLET | Freq: Every day | ORAL | Status: DC

## 2014-12-01 NOTE — Progress Notes (Signed)
[place] [phone]   Fax:([phone]) [place], [place] [address] [place] [address]  DIAGNOSIS: Stage IV (T2a, N0, M1b) non-small cell lung cancer consistent with adenocarcinoma with negative EGFR mutation and negative ALK gene translocation diagnosed in January of 2015 presented with right upper lobe lung mass in addition to a solitary brain metastasis status post stereotactic to a solitary brain metastasis as well as radiotherapy to the right upper lobe lung mass.  PRIOR THERAPY:  1) Status post stereotactic radiotherapy to a solitary right parietal brain lesion under the care of Dr. [name] on 10/16/2013. 2) Status post palliative radiotherapy to the right lung tumor under the care of Dr. [name] completed on 12/05/2013. 3) Systemic chemotherapy with carboplatin for AUC of 5 and Alimta 500 mg/M2 every 3 weeks. First dose Jan 06 2014. Status post 6 cycles.  CURRENT THERAPY: Systemic chemotherapy with maintenance Alimta 500 mg/M2 every 3 weeks. Status post 8 cycles.  INTERVAL HISTORY: Alexis [name] 64 y.o. female returns to the clinic today for followup visit accompanied by her husband. The patient is feeling fine today. She is tolerating her maintenance chemotherapy with Alimta fairly well. She denied having any significant chest pain, shortness of breath, cough or hemoptysis. She has no fever or chills, no nausea or vomiting. She denied having any significant weight loss or night sweats.   MEDICAL HISTORY: Past Medical History  Diagnosis Date  . Ileus, postoperative 11/18/2013  . Physical deconditioning 11/18/2013  . Severe protein-calorie malnutrition 11/18/2013  . Anemia 11/18/2013  . History of radiation therapy 10-28-13- 12-05-13    lung ca 50 Gy/48f  . Anxiety   . Hx of radiation therapy 10/16/13    rt parietal brain/20Gy  . 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  MEDICATIONS:  Current Outpatient Prescriptions  Medication Sig Dispense Refill  . ALPRAZolam (XANAX) 0.25 MG tablet Take 1 tablet (0.25 mg total) by mouth 3 (three) times daily as needed for anxiety. 45 tablet 0  . Alum & Mag Hydroxide-Simeth (MAGIC MOUTHWASH) SOLN Take 5 mLs by mouth 4 (four) times daily as needed for mouth pain. 160 mL 0  . Ascorbic Acid (VITAMIN C GUMMIE PO) Take 1 each by mouth every morning.    .Marland Kitchendexamethasone (DECADRON) 4 MG tablet 4 mg by mouth twice a day the day before, day of and day after the chemotherapy every 3 weeks 40 tablet 1  . folic acid (FOLVITE) 1 MG tablet Take 1 tablet (1 mg total) by mouth daily. 30 tablet 2  . Multiple Vitamin (MULTIVITAMIN WITH MINERALS) TABS tablet Take 1 tablet by mouth every morning.    . pentoxifylline (TRENTAL) 400 MG CR tablet Take 1 tablet (400 mg total) by mouth 2 (two) times daily. Patient is also to take vitamin E 400 mg BID with trental. 60 tablet 2  . Zinc 50 MG TABS Take 1 tablet by mouth every morning.    .Marland Kitchenacetaminophen (TYLENOL) 500 MG tablet Take 500 mg by mouth every 6 (six) hours as needed for mild pain or headache.    . ondansetron (ZOFRAN) 8 MG tablet Take 1 tablet (8 mg total) by mouth every 8 (eight) hours as needed for nausea or vomiting. (Patient not taking: Reported on 12/01/2014) 30 tablet 0  . OVER THE COUNTER MEDICATION Take  1 tablet by mouth every morning. (Vitamin A)    . oxyCODONE (OXY IR/ROXICODONE) 5 MG immediate release tablet Take 1-2 tablets (5-10 mg total) by mouth every 4 (four) hours as needed for moderate pain. (Patient not taking: Reported on 12/01/2014) 40 tablet 0  . prochlorperazine (COMPAZINE) 10 MG tablet Take 1 tablet (10 mg total) by mouth every 6 (six) hours as needed for nausea or vomiting. (Patient not taking: Reported on 12/01/2014) 60 tablet 0   No current facility-administered medications for this visit.    SURGICAL HISTORY:  Past Surgical  History  Procedure Laterality Date  . Video bronchoscopy Bilateral 08/30/2013    Procedure: VIDEO BRONCHOSCOPY WITH FLUORO;  Surgeon: Tanda Rockers, MD;  Location: [facility];  Service: Cardiopulmonary;  Laterality: Bilateral;  . Abdominal hysterectomy    . Laparotomy N/A 11/03/2013    Procedure: EXPLORATORY LAPAROTOMY, DRAINAGE OF INTRA  ABDOMINAL ABSCESSES, MOBILIZATION OF SPLENIC FLEXURE, SIGMOID COLECTOMY WITH COLOSTOMY;  Surgeon: Odis Hollingshead, MD;  Location: [facility];  Service: General;  Laterality: N/A;  . Colostomy takedown N/A 07/10/2014    Procedure: LAPAROSCOPIC LYSIS OF ADHESIONS (90 MIN) LAPAROSCOPIC ASSISTED COLOSTOMY CLOSURE, RIGID PROCTOSIGMOIDOSCOPY;  Surgeon: Jackolyn Confer, MD;  Location: [facility];  Service: General;  Laterality: N/A;    REVIEW OF SYSTEMS:  Constitutional: positive for fatigue Eyes: negative Ears, nose, mouth, throat, and face: negative Respiratory: negative Cardiovascular: negative Gastrointestinal: negative Genitourinary:negative Integument/breast: negative Hematologic/lymphatic: negative Musculoskeletal:negative Neurological: positive for weakness Behavioral/Psych: positive for depression Endocrine: negative Allergic/Immunologic: negative   PHYSICAL EXAMINATION: General appearance: alert, cooperative, fatigued and no distress Head: Normocephalic, without obvious abnormality, atraumatic Neck: no adenopathy, no JVD, supple, symmetrical, trachea midline and thyroid not enlarged, symmetric, no tenderness/mass/nodules Lymph nodes: Cervical, supraclavicular, and axillary nodes normal. Resp: clear to auscultation bilaterally Back: symmetric, no curvature. ROM normal. No CVA tenderness. Cardio: regular rate and rhythm, S1, S2 normal, no murmur, click, rub or gallop GI: soft, non-tender; bowel sounds normal; no masses,  no organomegaly Extremities: extremities normal, atraumatic, no cyanosis or edema Neurologic: Alert and oriented X 3, normal strength  and tone. Normal symmetric reflexes. Normal coordination and gait  ECOG PERFORMANCE STATUS: 2 - Symptomatic, <50% confined to bed  Blood pressure 106/47, pulse 66, temperature 98 F (36.7 C), temperature source Oral, resp. rate 18, height _0  (1.626 m), weight 139 lb 14.4 oz (63.458 kg), SpO2 100 %.  LABORATORY DATA: Lab Results  Component Value Date   WBC 8.5 12/01/2014   HGB 11.7 12/01/2014   HCT 36.2 12/01/2014   MCV 99.6 12/01/2014   PLT 342 12/01/2014      Chemistry      Component Value Date/Time   NA 139 11/10/2014 0919   NA 139 07/12/2014 0512   K 3.9 11/10/2014 0919   K 3.9 07/12/2014 0512   CL 100 07/12/2014 0512   CO2 25 11/10/2014 0919   CO2 32 07/12/2014 0512   BUN 14.6 11/10/2014 0919   BUN 5* 07/12/2014 0512   CREATININE 0.8 11/10/2014 0919   CREATININE 0.55 07/12/2014 0512      Component Value Date/Time   CALCIUM 9.7 11/10/2014 0919   CALCIUM 9.0 07/12/2014 0512   ALKPHOS 85 11/10/2014 0919   ALKPHOS 91 07/10/2014 0955   AST 12 11/10/2014 0919   AST 15 07/10/2014 0955   ALT 13 11/10/2014 0919   ALT 8 07/10/2014 0955   BILITOT 0.39 11/10/2014 0919   BILITOT 0.4 07/10/2014 0955       RADIOGRAPHIC  STUDIES: Mr Kizzie Fantasia Contrast  11-11-14   CLINICAL DATA:  Metastatic lung cancer. SRS to a right parietal brain lesion in 09/2013. Continuing systemic chemotherapy.  EXAM: MRI HEAD WITHOUT AND WITH CONTRAST  TECHNIQUE: Multiplanar, multiecho pulse sequences of the brain and surrounding structures were obtained without and with intravenous contrast.  CONTRAST:  33m MULTIHANCE GADOBENATE DIMEGLUMINE 529 MG/ML IV SOLN  COMPARISON:  07/30/2014  FINDINGS: There is no evidence of acute infarct, intracranial hemorrhage, midline shift, or extra-axial fluid collection. Mild cerebral atrophy is unchanged. An incidental, small right frontal developmental venous anomaly is noted.  Peripherally enhancing right parietal lesion is slightly larger than on the prior study,  measuring 13 x 8 mm (series 10, image 107, previously 10 x 6 mm). Small amount of surrounding T2 hyperintensity is unchanged. No new enhancing lesions are identified.  Orbits are unremarkable. Focal left ethmoid air cell opacification is unchanged. Mastoid air cells are clear. Major intracranial vascular flow voids are preserved. Small, nonenhancing left parietal scalp lesion is unchanged.  IMPRESSION: Minimally increased size of right parietal lesion. No evidence of new metastases.   Electronically Signed   By: ALogan Bores  On: 0March 15, 201611:25    ASSESSMENT AND PLAN: This is a very pleasant 64years old white female recently diagnosed with a stage IV non-small cell lung cancer, adenocarcinoma with negative EGFR mutation and negative ALK gene translocation. The patient is status post stereotactic radiotherapy to a solitary brain metastases in addition to palliative radiotherapy to the right upper lobe lung mass. She status post 6 cycles of systemic chemotherapy with carboplatin and Alimta with improvement in her disease and she is currently undergoing maintenance chemotherapy with single agent Alimta status post 8 cycles and tolerating her treatment fairly well. I recommended for the patient to proceed to cycle #9 today as a scheduled. She would come back for follow-up visit in 3 weeks with the next cycle of her treatment after repeating CT scan of the chest, abdomen and pelvis for restaging of her disease.. For history of depression, the patient on Remeron 30 mg by mouth each bedtime. For anxiety, she is currently on Xanax 0.25 mg by mouth 3 times a day as needed. She was given a refill of her medication today. The patient was advised to call immediately if she has any concerning symptoms in the interval. The patient voices understanding of current disease status and treatment options and is in agreement with the current care plan.  All questions were answered. The patient knows to call the clinic  with any problems, questions or concerns. We can certainly see the patient much sooner if necessary.  Disclaimer: This note was dictated with voice recognition software. Similar sounding words can inadvertently be transcribed and may not be corrected upon review.

## 2014-12-01 NOTE — Telephone Encounter (Signed)
gave and printed appt sched and avs for pt for April...gv barium

## 2014-12-01 NOTE — Patient Instructions (Signed)
[facility] Discharge Instructions for Patients Receiving Chemotherapy  Today you received the following chemotherapy agents Alimata.  To help prevent nausea and vomiting after your treatment, we encourage you to take your nausea medication as prescribed.   If you develop nausea and vomiting that is not controlled by your nausea medication, call the clinic.   BELOW ARE SYMPTOMS THAT SHOULD BE REPORTED IMMEDIATELY:  *FEVER GREATER THAN 100.5 F  *CHILLS WITH OR WITHOUT FEVER  NAUSEA AND VOMITING THAT IS NOT CONTROLLED WITH YOUR NAUSEA MEDICATION  *UNUSUAL SHORTNESS OF BREATH  *UNUSUAL BRUISING OR BLEEDING  TENDERNESS IN MOUTH AND THROAT WITH OR WITHOUT PRESENCE OF ULCERS  *URINARY PROBLEMS  *BOWEL PROBLEMS  UNUSUAL RASH Items with * indicate a potential emergency and should be followed up as soon as possible.  Feel free to call the clinic you have any questions or concerns. The clinic phone number is [phone].  Please show the [facility] at check-in to the Emergency Department and triage nurse.

## 2014-12-19 ENCOUNTER — Ambulatory Visit (HOSPITAL_COMMUNITY)
Admission: RE | Admit: 2014-12-19 | Discharge: 2014-12-19 | Disposition: A | Discharge status: Home / Self Care | Payer: BLUE CROSS/BLUE SHIELD | Source: Ambulatory Visit | Attending: Internal Medicine | Admitting: Internal Medicine

## 2014-12-19 DIAGNOSIS — C7931 Secondary malignant neoplasm of brain: Secondary | ICD-10-CM | POA: Diagnosis not present

## 2014-12-19 DIAGNOSIS — Z87891 Personal history of nicotine dependence: Secondary | ICD-10-CM | POA: Insufficient documentation

## 2014-12-19 DIAGNOSIS — C3411 Malignant neoplasm of upper lobe, right bronchus or lung: Secondary | ICD-10-CM | POA: Diagnosis not present

## 2014-12-19 DIAGNOSIS — C7949 Secondary malignant neoplasm of other parts of nervous system: Secondary | ICD-10-CM

## 2014-12-19 MED ORDER — IOHEXOL 300 MG/ML  SOLN
80.0000 mL | Freq: Once | INTRAMUSCULAR | Status: AC | PRN
Start: 2014-12-19 — End: 2014-12-19
  Administered 2014-12-19: 80 mL via INTRAVENOUS

## 2014-12-22 ENCOUNTER — Ambulatory Visit (HOSPITAL_BASED_OUTPATIENT_CLINIC_OR_DEPARTMENT_OTHER): Payer: BLUE CROSS/BLUE SHIELD

## 2014-12-22 ENCOUNTER — Ambulatory Visit (HOSPITAL_BASED_OUTPATIENT_CLINIC_OR_DEPARTMENT_OTHER): Payer: BLUE CROSS/BLUE SHIELD | Admitting: Physician Assistant

## 2014-12-22 ENCOUNTER — Other Ambulatory Visit (HOSPITAL_BASED_OUTPATIENT_CLINIC_OR_DEPARTMENT_OTHER): Payer: BLUE CROSS/BLUE SHIELD

## 2014-12-22 ENCOUNTER — Encounter: Payer: Self-pay | Admitting: Physician Assistant

## 2014-12-22 ENCOUNTER — Telehealth: Payer: Self-pay | Admitting: Physician Assistant

## 2014-12-22 VITALS — BP 120/59 | HR 63 | Temp 98.6°F | Resp 18 | Ht 64.0 in | Wt 132.5 lb

## 2014-12-22 DIAGNOSIS — Z5111 Encounter for antineoplastic chemotherapy: Secondary | ICD-10-CM | POA: Diagnosis not present

## 2014-12-22 DIAGNOSIS — C7931 Secondary malignant neoplasm of brain: Secondary | ICD-10-CM | POA: Diagnosis not present

## 2014-12-22 DIAGNOSIS — C7949 Secondary malignant neoplasm of other parts of nervous system: Secondary | ICD-10-CM

## 2014-12-22 DIAGNOSIS — C3411 Malignant neoplasm of upper lobe, right bronchus or lung: Secondary | ICD-10-CM

## 2014-12-22 LAB — COMPREHENSIVE METABOLIC PANEL (CC13)
ALT: 8 U/L (ref 0–55)
AST: 10 U/L (ref 5–34)
Albumin: 3.6 g/dL (ref 3.5–5.0)
Alkaline Phosphatase: 91 U/L (ref 40–150)
Anion Gap: 14 mEq/L — ABNORMAL HIGH (ref 3–11)
BUN: 12.5 mg/dL (ref 7.0–26.0)
CO2: 23 mEq/L (ref 22–29)
Calcium: 9.5 mg/dL (ref 8.4–10.4)
Chloride: 103 mEq/L (ref 98–109)
Creatinine: 0.7 mg/dL (ref 0.6–1.1)
EGFR: >90 mL/min/{1.73_m2} (ref >90)
Glucose: 115 mg/dl (ref 70–140)
Potassium: 4 mEq/L (ref 3.5–5.1)
Sodium: 141 mEq/L (ref 136–145)
Total Bilirubin: 0.23 mg/dL (ref 0.20–1.20)
Total Protein: 7.1 g/dL (ref 6.4–8.3)

## 2014-12-22 LAB — CBC WITH DIFFERENTIAL/PLATELET
BASO%: 0 % (ref 0.0–2.0)
Basophils Absolute: 0 10*3/uL (ref 0.0–0.1)
EOS%: 0.1 % (ref 0.0–7.0)
Eosinophils Absolute: 0 10*3/uL (ref 0.0–0.5)
HCT: 37.2 % (ref 34.8–46.6)
HGB: 12 g/dL (ref 11.6–15.9)
LYMPH%: 4.4 % — ABNORMAL LOW (ref 14.0–49.7)
MCH: 32.5 pg (ref 25.1–34.0)
MCHC: 32.3 g/dL (ref 31.5–36.0)
MCV: 100.8 fL (ref 79.5–101.0)
MONO#: 0.6 10*3/uL (ref 0.1–0.9)
MONO%: 7 % (ref 0.0–14.0)
NEUT#: 7.2 10*3/uL — ABNORMAL HIGH (ref 1.5–6.5)
NEUT%: 88.5 % — ABNORMAL HIGH (ref 38.4–76.8)
Platelets: 297 10*3/uL (ref 145–400)
RBC: 3.69 10*6/uL — ABNORMAL LOW (ref 3.70–5.45)
RDW: 14 % (ref 11.2–14.5)
WBC: 8.1 10*3/uL (ref 3.9–10.3)
lymph#: 0.4 10*3/uL — ABNORMAL LOW (ref 0.9–3.3)

## 2014-12-22 MED ORDER — SODIUM CHLORIDE 0.9 % IV SOLN
500.0000 mg/m2 | Freq: Once | INTRAVENOUS | Status: AC
  Administered 2014-12-22: 800 mg via INTRAVENOUS
  Filled 2014-12-22: qty 32

## 2014-12-22 MED ORDER — SODIUM CHLORIDE 0.9 % IV SOLN
Freq: Once | INTRAVENOUS | Status: AC
  Administered 2014-12-22: 11:00:00 via INTRAVENOUS

## 2014-12-22 MED ORDER — SODIUM CHLORIDE 0.9 % IV SOLN
Freq: Once | INTRAVENOUS | Status: AC
  Administered 2014-12-22: 11:00:00 via INTRAVENOUS
  Filled 2014-12-22: qty 4

## 2014-12-22 NOTE — Progress Notes (Addendum)
[facility] [phone]   Fax:([phone]) [place], [place] [address] [place] [address]  DIAGNOSIS: Stage IV (T2a, N0, M1b) non-small cell lung cancer consistent with adenocarcinoma with negative EGFR mutation and negative ALK gene translocation diagnosed in January of 2015 presented with right upper lobe lung mass in addition to a solitary brain metastasis status post stereotactic to a solitary brain metastasis as well as radiotherapy to the right upper lobe lung mass.  PRIOR THERAPY:  1) Status post stereotactic radiotherapy to a solitary right parietal brain lesion under the care of Dr. [name] on 10/16/2013. 2) Status post palliative radiotherapy to the right lung tumor under the care of Dr. [name] completed on 12/05/2013. 3) Systemic chemotherapy with carboplatin for AUC of 5 and Alimta 500 mg/M2 every 3 weeks. First dose Jan 06 2014. Status post 6 cycles.  CURRENT THERAPY: Systemic chemotherapy with maintenance Alimta 500 mg/M2 every 3 weeks. Status post 9 cycles.  INTERVAL HISTORY: Alexis [name] 64 y.o. female returns to the clinic today for followup visit accompanied by her husband. The patient is feeling fine today. She is tolerating her maintenance chemotherapy with Alimta fairly well. She denied having any significant chest pain, shortness of breath, cough or hemoptysis. She has no fever or chills, no nausea or vomiting. She denied having any significant weight loss or night sweats. She continues to have difficulty sleeping and intermittent episodes of anxiety. This is currently managed with Xanax, one tablet in the evenings to help her rest and intermittently 1 tablet during the day as needed for anxiety. She recently had a restaging CT scan of the chest, abdomen and pelvis and presents to discuss the results.  MEDICAL HISTORY: Past Medical History  Diagnosis Date  . Ileus, postoperative 11/18/2013  . Physical  deconditioning 11/18/2013  . Severe protein-calorie malnutrition 11/18/2013  . Anemia 11/18/2013  . History of radiation therapy 10-28-13- 12-05-13    lung ca 50 Gy/35f  . Anxiety   . Hx of radiation therapy 10/16/13    rt parietal brain/20Gy  . 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  MEDICATIONS:  Current Outpatient Prescriptions  Medication Sig Dispense Refill  . acetaminophen (TYLENOL) 500 MG tablet Take 500 mg by mouth every 6 (six) hours as needed for mild pain or headache.    . ALPRAZolam (XANAX) 0.25 MG tablet Take 1 tablet (0.25 mg total) by mouth 3 (three) times daily as needed for anxiety. 45 tablet 0  . Alum & Mag Hydroxide-Simeth (MAGIC MOUTHWASH) SOLN Take 5 mLs by mouth 4 (four) times daily as needed for mouth pain. 160 mL 0  . Ascorbic Acid (VITAMIN C GUMMIE PO) Take 1 each by mouth every morning.    .Marland Kitchendexamethasone (DECADRON) 4 MG tablet 4 mg by mouth twice a day the day before, day of and day after the chemotherapy every 3 weeks 40 tablet 1  . folic acid (FOLVITE) 1 MG tablet Take 1 tablet (1 mg total) by mouth daily. 30 tablet 2  . Multiple Vitamin (MULTIVITAMIN WITH MINERALS) TABS tablet Take 1 tablet by mouth every morning.    . nystatin (MYCOSTATIN) 100000 UNIT/ML suspension   0  . ondansetron (ZOFRAN) 8 MG tablet Take 1 tablet (8 mg total) by mouth every 8 (eight) hours as needed for nausea or vomiting. 3[place]  tablet 0  . OVER THE COUNTER MEDICATION Take 1 tablet by mouth every morning. (Vitamin A)    . pentoxifylline (TRENTAL) 400 MG CR tablet Take 1 tablet (400 mg total) by mouth 2 (two) times daily. Patient is also to take vitamin E 400 mg BID with trental. 60 tablet 2  . prochlorperazine (COMPAZINE) 10 MG tablet Take 1 tablet (10 mg total) by mouth every 6 (six) hours as needed for nausea or vomiting. 60 tablet 0  . Zinc 50 MG TABS Take 1 tablet by mouth every morning.    Marland Kitchen oxyCODONE (OXY  IR/ROXICODONE) 5 MG immediate release tablet Take 1-2 tablets (5-10 mg total) by mouth every 4 (four) hours as needed for moderate pain. (Patient not taking: Reported on 12/22/2014) 40 tablet 0   No current facility-administered medications for this visit.   Facility-Administered Medications Ordered in Other Visits  Medication Dose Route Frequency Provider Last Rate Last Dose  . 0.9 %  sodium chloride infusion   Intravenous Once Curt Bears, MD      . ondansetron [facility]) 8 mg, dexamethasone (DECADRON) 10 mg in sodium chloride 0.9 % 50 mL IVPB   Intravenous Once Curt Bears, MD      . PEMEtrexed (ALIMTA) 800 mg in sodium chloride 0.9 % 100 mL chemo infusion  500 mg/m2 (Treatment Plan Actual) Intravenous Once Curt Bears, MD        SURGICAL HISTORY:  Past Surgical History  Procedure Laterality Date  . Video bronchoscopy Bilateral 08/30/2013    Procedure: VIDEO BRONCHOSCOPY WITH FLUORO;  Surgeon: Tanda Rockers, MD;  Location: [facility];  Service: Cardiopulmonary;  Laterality: Bilateral;  . Abdominal hysterectomy    . Laparotomy N/A 11/03/2013    Procedure: EXPLORATORY LAPAROTOMY, DRAINAGE OF INTRA  ABDOMINAL ABSCESSES, MOBILIZATION OF SPLENIC FLEXURE, SIGMOID COLECTOMY WITH COLOSTOMY;  Surgeon: Odis Hollingshead, MD;  Location: [facility];  Service: General;  Laterality: N/A;  . Colostomy takedown N/A 07/10/2014    Procedure: LAPAROSCOPIC LYSIS OF ADHESIONS (90 MIN) LAPAROSCOPIC ASSISTED COLOSTOMY CLOSURE, RIGID PROCTOSIGMOIDOSCOPY;  Surgeon: Jackolyn Confer, MD;  Location: [facility];  Service: General;  Laterality: N/A;    REVIEW OF SYSTEMS:  Constitutional: positive for fatigue Eyes: negative Ears, nose, mouth, throat, and face: negative Respiratory: negative Cardiovascular: negative Gastrointestinal: negative Genitourinary:negative Integument/breast: negative Hematologic/lymphatic: negative Musculoskeletal:negative Neurological: negative Behavioral/Psych: positive for anxiety  and depression Endocrine: negative Allergic/Immunologic: negative   PHYSICAL EXAMINATION: General appearance: alert, cooperative, fatigued and no distress Head: Normocephalic, without obvious abnormality, atraumatic Neck: no adenopathy, no JVD, supple, symmetrical, trachea midline and thyroid not enlarged, symmetric, no tenderness/mass/nodules Lymph nodes: Cervical, supraclavicular, and axillary nodes normal. Resp: clear to auscultation bilaterally Back: symmetric, no curvature. ROM normal. No CVA tenderness. Cardio: regular rate and rhythm, S1, S2 normal, no murmur, click, rub or gallop GI: soft, non-tender; bowel sounds normal; no masses,  no organomegaly Extremities: extremities normal, atraumatic, no cyanosis or edema Neurologic: Alert and oriented X 3, normal strength and tone. Normal symmetric reflexes. Normal coordination and gait  ECOG PERFORMANCE STATUS: 2 - Symptomatic, <50% confined to bed  Blood pressure 120/59, pulse 63, temperature 98.6 F (37 C), temperature source Oral, resp. rate 18, height $RemoveBe'5\' 4"'rrrdEpNZj$  (1.626 m), weight 132 lb 8 oz (60.102 kg), SpO2 100 %.  LABORATORY DATA: Lab Results  Component Value Date   WBC 8.1 12/22/2014   HGB 12.0 12/22/2014   HCT 37.2 12/22/2014   MCV 100.8 12/22/2014   PLT 297 12/22/2014      Chemistry  Component Value Date/Time   NA 141 12/22/2014 0900   NA 139 07/12/2014 0512   K 4.0 12/22/2014 0900   K 3.9 07/12/2014 0512   CL 100 07/12/2014 0512   CO2 23 12/22/2014 0900   CO2 32 07/12/2014 0512   BUN 12.5 12/22/2014 0900   BUN 5* 07/12/2014 0512   CREATININE 0.7 12/22/2014 0900   CREATININE 0.55 07/12/2014 0512      Component Value Date/Time   CALCIUM 9.5 12/22/2014 0900   CALCIUM 9.0 07/12/2014 0512   ALKPHOS 91 12/22/2014 0900   ALKPHOS 91 07/10/2014 0955   AST 10 12/22/2014 0900   AST 15 07/10/2014 0955   ALT 8 12/22/2014 0900   ALT 8 07/10/2014 0955   BILITOT 0.23 12/22/2014 0900   BILITOT 0.4 07/10/2014 0955         RADIOGRAPHIC STUDIES: Ct Chest W Contrast  12/19/2014   CLINICAL DATA:  Patient with history of right lung cancer. Restaging evaluation.  EXAM: CT CHEST, ABDOMEN, AND PELVIS WITH CONTRAST  TECHNIQUE: Multidetector CT imaging of the chest, abdomen and pelvis was performed following the standard protocol during bolus administration of intravenous contrast.  CONTRAST:  10m OMNIPAQUE IOHEXOL 300 MG/ML  SOLN  COMPARISON:  CT 10/17/2014  FINDINGS: CT CHEST FINDINGS  Mediastinum/Nodes: Visualized thyroid is unremarkable. Unchanged 9 mm right hilar lymph node (image 27; series 2). No axillary or mediastinal adenopathy. Normal heart size. No pericardial effusion. Vascular calcifications involving the aortic arch.  Lungs/Pleura: Central airways are patent. Re- demonstrated biapical emphysematous change. No significant interval change in size of right upper lobe mass measuring 2.4 x 1.5 cm, previously 2.3 x 1.5 cm. There is a new focus of ground-glass attenuation within the right upper lobe (image 19; series 4). No pleural effusion or pneumothorax.  Musculoskeletal: No aggressive or acute appearing osseous lesions.  CT ABDOMEN AND PELVIS FINDINGS  Hepatobiliary: Liver is normal in size and contour without focal hepatic lesion identified. Gallbladder is unremarkable. No intrahepatic or extrahepatic biliary ductal dilatation.  Pancreas: Unremarkable  Spleen: Unremarkable  Adrenals/Urinary Tract: Normal adrenal glands. Kidneys enhance symmetrically with contrast. No hydronephrosis. Urinary bladder is unremarkable.  Stomach/Bowel: The appendix is normal. No evidence for bowel obstruction. No free fluid or free intraperitoneal air.  Vascular/Lymphatic: Normal caliber abdominal aorta with peripheral atherosclerotic plaque. No retroperitoneal lymphadenopathy.  Other: Patient status post hysterectomy.  Musculoskeletal: No aggressive or acute appearing osseous lesions.  IMPRESSION: No significant interval change in size of  right upper lobe pulmonary nodule, compatible with known pulmonary neoplasm.  Interval development of a focal area of ground-glass attenuation within the right upper lobe which is nonspecific in etiology and may be secondary to an infectious and/or inflammatory process. Attention on follow-up imaging is recommended to exclude the possibility of additional sites of disease.   Electronically Signed   By: DLovey NewcomerM.D.   On: 12/19/2014 10:09   Ct Abdomen Pelvis W Contrast  12/19/2014   CLINICAL DATA:  Patient with history of right lung cancer. Restaging evaluation.  EXAM: CT CHEST, ABDOMEN, AND PELVIS WITH CONTRAST  TECHNIQUE: Multidetector CT imaging of the chest, abdomen and pelvis was performed following the standard protocol during bolus administration of intravenous contrast.  CONTRAST:  868mOMNIPAQUE IOHEXOL 300 MG/ML  SOLN  COMPARISON:  CT 10/17/2014  FINDINGS: CT CHEST FINDINGS  Mediastinum/Nodes: Visualized thyroid is unremarkable. Unchanged 9 mm right hilar lymph node (image 27; series 2). No axillary or mediastinal adenopathy. Normal heart size. No pericardial effusion. Vascular  calcifications involving the aortic arch.  Lungs/Pleura: Central airways are patent. Re- demonstrated biapical emphysematous change. No significant interval change in size of right upper lobe mass measuring 2.4 x 1.5 cm, previously 2.3 x 1.5 cm. There is a new focus of ground-glass attenuation within the right upper lobe (image 19; series 4). No pleural effusion or pneumothorax.  Musculoskeletal: No aggressive or acute appearing osseous lesions.  CT ABDOMEN AND PELVIS FINDINGS  Hepatobiliary: Liver is normal in size and contour without focal hepatic lesion identified. Gallbladder is unremarkable. No intrahepatic or extrahepatic biliary ductal dilatation.  Pancreas: Unremarkable  Spleen: Unremarkable  Adrenals/Urinary Tract: Normal adrenal glands. Kidneys enhance symmetrically with contrast. No hydronephrosis. Urinary bladder  is unremarkable.  Stomach/Bowel: The appendix is normal. No evidence for bowel obstruction. No free fluid or free intraperitoneal air.  Vascular/Lymphatic: Normal caliber abdominal aorta with peripheral atherosclerotic plaque. No retroperitoneal lymphadenopathy.  Other: Patient status post hysterectomy.  Musculoskeletal: No aggressive or acute appearing osseous lesions.  IMPRESSION: No significant interval change in size of right upper lobe pulmonary nodule, compatible with known pulmonary neoplasm.  Interval development of a focal area of ground-glass attenuation within the right upper lobe which is nonspecific in etiology and may be secondary to an infectious and/or inflammatory process. Attention on follow-up imaging is recommended to exclude the possibility of additional sites of disease.   Electronically Signed   By: Lovey Newcomer M.D.   On: 12/19/2014 10:09    ASSESSMENT AND PLAN: This is a very pleasant 65 years old white female recently diagnosed with a stage IV non-small cell lung cancer, adenocarcinoma with negative EGFR mutation and negative ALK gene translocation. The patient is status post stereotactic radiotherapy to a solitary brain metastases in addition to palliative radiotherapy to the right upper lobe lung mass. She status post 6 cycles of systemic chemotherapy with carboplatin and Alimta with improvement in her disease and she is currently undergoing maintenance chemotherapy with single agent Alimta, status post 9 cycles and tolerating her treatment fairly well. The patient was discussed with and also seen by Dr. Julien Nordmann. Her recent restaging CT scan of the chest, abdomen and pelvis with contrast revealed stable disease. She'll proceed with cycle #10 today as scheduled. She'll follow-up in 3 weeks prior to cycle #11. For history of depression, the patient on Remeron 30 mg by mouth each bedtime. For anxiety, she is currently on Xanax 0.25 mg by mouth 3 times a day as needed. She was given a  refill of her medication today. The patient was advised to call immediately if she has any concerning symptoms in the interval. The patient voices understanding of current disease status and treatment options and is in agreement with the current care plan.  All questions were answered. The patient knows to call the clinic with any problems, questions or concerns. We can certainly see the patient much sooner if necessary.  Carlton Adam, PA-C 12/22/2014  ADDENDUM:  Hematology/Oncology Attending:  I had a face to face encounter with the patient. I recommended his care plan. This is a very pleasant 64 years old white female with metastatic non-small cell lung cancer, adenocarcinoma status post induction chemotherapy with carboplatin and Alimta and she is currently undergoing maintenance chemotherapy with single agent Alimta status post 9 cycles. The patient is tolerating her treatment with maintenance Alimta fairly well with no significant adverse effects except for mild fatigue. Her recent CT scan of the chest, abdomen and pelvis showed no significant evidence for disease progression. I  discussed the scan results with the patient and her husband and recommended for her to continue on her current treatment maintenance Alimta as scheduled. She will receive cycle #10 today. The patient would come back for follow-up visit in 3 weeks for reevaluation before starting cycle #11. She was given refills of Xanax for anxiety. She was advised to call immediately if she has any concerning symptoms in the interval.  Disclaimer: This note was dictated with voice recognition software. Similar sounding words can inadvertently be transcribed and may be missed upon review. Eilleen Kempf., MD 12/27/2014

## 2014-12-22 NOTE — Patient Instructions (Signed)
Your recent restaging CT scan revealed stable disease Follow-up in 3 weeks prior to next scheduled cycle of maintenance chemotherapy

## 2014-12-22 NOTE — Telephone Encounter (Signed)
Pt confirmed labs/ov per 04/25 POF, gave pt AVS and calendar..... KJ, sent msg to add chemo

## 2015-01-09 ENCOUNTER — Ambulatory Visit
Admission: RE | Admit: 2015-01-09 | Discharge: 2015-01-09 | Disposition: A | Discharge status: Home / Self Care | Payer: BLUE CROSS/BLUE SHIELD | Source: Ambulatory Visit | Attending: Radiation Oncology | Admitting: Radiation Oncology

## 2015-01-09 DIAGNOSIS — C7931 Secondary malignant neoplasm of brain: Secondary | ICD-10-CM

## 2015-01-09 MED ORDER — GADOBENATE DIMEGLUMINE 529 MG/ML IV SOLN
12.0000 mL | Freq: Once | INTRAVENOUS | Status: AC | PRN
  Administered 2015-01-09: 12 mL via INTRAVENOUS

## 2015-01-12 ENCOUNTER — Encounter: Payer: Self-pay | Admitting: Radiation Therapy

## 2015-01-12 ENCOUNTER — Ambulatory Visit (HOSPITAL_BASED_OUTPATIENT_CLINIC_OR_DEPARTMENT_OTHER): Payer: BLUE CROSS/BLUE SHIELD | Admitting: Oncology

## 2015-01-12 ENCOUNTER — Encounter: Payer: Self-pay | Admitting: Radiation Oncology

## 2015-01-12 ENCOUNTER — Ambulatory Visit
Admission: RE | Admit: 2015-01-12 | Discharge: 2015-01-12 | Disposition: A | Discharge status: Home / Self Care | Payer: BLUE CROSS/BLUE SHIELD | Source: Ambulatory Visit | Attending: Radiation Oncology | Admitting: Radiation Oncology

## 2015-01-12 ENCOUNTER — Ambulatory Visit (HOSPITAL_BASED_OUTPATIENT_CLINIC_OR_DEPARTMENT_OTHER): Payer: BLUE CROSS/BLUE SHIELD

## 2015-01-12 ENCOUNTER — Telehealth: Payer: Self-pay | Admitting: *Deleted

## 2015-01-12 ENCOUNTER — Encounter: Payer: Self-pay | Admitting: Oncology

## 2015-01-12 ENCOUNTER — Other Ambulatory Visit (HOSPITAL_BASED_OUTPATIENT_CLINIC_OR_DEPARTMENT_OTHER): Payer: BLUE CROSS/BLUE SHIELD

## 2015-01-12 ENCOUNTER — Telehealth: Payer: Self-pay | Admitting: Internal Medicine

## 2015-01-12 VITALS — BP 116/41 | HR 69 | Temp 97.5°F | Resp 16 | Ht 64.0 in | Wt 133.1 lb

## 2015-01-12 DIAGNOSIS — F419 Anxiety disorder, unspecified: Secondary | ICD-10-CM

## 2015-01-12 DIAGNOSIS — C3411 Malignant neoplasm of upper lobe, right bronchus or lung: Secondary | ICD-10-CM

## 2015-01-12 DIAGNOSIS — C7931 Secondary malignant neoplasm of brain: Secondary | ICD-10-CM

## 2015-01-12 DIAGNOSIS — Z5111 Encounter for antineoplastic chemotherapy: Secondary | ICD-10-CM | POA: Diagnosis not present

## 2015-01-12 DIAGNOSIS — C7949 Secondary malignant neoplasm of other parts of nervous system: Secondary | ICD-10-CM

## 2015-01-12 DIAGNOSIS — C3410 Malignant neoplasm of upper lobe, unspecified bronchus or lung: Secondary | ICD-10-CM

## 2015-01-12 LAB — COMPREHENSIVE METABOLIC PANEL (CC13)
ALT: 9 U/L (ref 0–55)
AST: 10 U/L (ref 5–34)
Albumin: 3.2 g/dL — ABNORMAL LOW (ref 3.5–5.0)
Alkaline Phosphatase: 100 U/L (ref 40–150)
Anion Gap: 12 mEq/L — ABNORMAL HIGH (ref 3–11)
BUN: 13.5 mg/dL (ref 7.0–26.0)
CO2: 26 mEq/L (ref 22–29)
Calcium: 9.6 mg/dL (ref 8.4–10.4)
Chloride: 103 mEq/L (ref 98–109)
Creatinine: 0.7 mg/dL (ref 0.6–1.1)
EGFR: 87 mL/min/{1.73_m2} — ABNORMAL LOW (ref >90)
Glucose: 116 mg/dl (ref 70–140)
Potassium: 4 mEq/L (ref 3.5–5.1)
Sodium: 142 mEq/L (ref 136–145)
Total Bilirubin: 0.25 mg/dL (ref 0.20–1.20)
Total Protein: 7.1 g/dL (ref 6.4–8.3)

## 2015-01-12 LAB — CBC WITH DIFFERENTIAL/PLATELET
BASO%: 0.4 % (ref 0.0–2.0)
Basophils Absolute: 0 10*3/uL (ref 0.0–0.1)
EOS%: 0 % (ref 0.0–7.0)
Eosinophils Absolute: 0 10*3/uL (ref 0.0–0.5)
HCT: 33.4 % — ABNORMAL LOW (ref 34.8–46.6)
HGB: 11.2 g/dL — ABNORMAL LOW (ref 11.6–15.9)
LYMPH%: 4.3 % — ABNORMAL LOW (ref 14.0–49.7)
MCH: 32.1 pg (ref 25.1–34.0)
MCHC: 33.6 g/dL (ref 31.5–36.0)
MCV: 95.5 fL (ref 79.5–101.0)
MONO#: 0.5 10*3/uL (ref 0.1–0.9)
MONO%: 7.4 % (ref 0.0–14.0)
NEUT#: 6.2 10*3/uL (ref 1.5–6.5)
NEUT%: 87.9 % — ABNORMAL HIGH (ref 38.4–76.8)
Platelets: 425 10*3/uL — ABNORMAL HIGH (ref 145–400)
RBC: 3.5 10*6/uL — ABNORMAL LOW (ref 3.70–5.45)
RDW: 13.7 % (ref 11.2–14.5)
WBC: 7 10*3/uL (ref 3.9–10.3)
lymph#: 0.3 10*3/uL — ABNORMAL LOW (ref 0.9–3.3)

## 2015-01-12 MED ORDER — SODIUM CHLORIDE 0.9 % IV SOLN
Freq: Once | INTRAVENOUS | Status: AC
  Administered 2015-01-12: 11:00:00 via INTRAVENOUS
  Filled 2015-01-12: qty 4

## 2015-01-12 MED ORDER — SODIUM CHLORIDE 0.9 % IV SOLN
Freq: Once | INTRAVENOUS | Status: AC
  Administered 2015-01-12: 11:00:00 via INTRAVENOUS

## 2015-01-12 MED ORDER — PENTOXIFYLLINE ER 400 MG PO TBCR: 400.0000 mg | EXTENDED_RELEASE_TABLET | Freq: Two times a day (BID) | ORAL | Status: DC

## 2015-01-12 MED ORDER — SODIUM CHLORIDE 0.9 % IJ SOLN
10.0000 mL | INTRAMUSCULAR | Status: DC | PRN
  Filled 2015-01-12: qty 10

## 2015-01-12 MED ORDER — PEMETREXED DISODIUM CHEMO INJECTION 500 MG
500.0000 mg/m2 | Freq: Once | INTRAVENOUS | Status: AC
  Administered 2015-01-12: 800 mg via INTRAVENOUS
  Filled 2015-01-12: qty 32

## 2015-01-12 MED ORDER — HEPARIN SOD (PORK) LOCK FLUSH 100 UNIT/ML IV SOLN
500.0000 [IU] | Freq: Once | INTRAVENOUS | Status: DC | PRN
  Filled 2015-01-12: qty 5

## 2015-01-12 MED ORDER — ALPRAZOLAM 0.25 MG PO TABS: 0.2500 mg | ORAL_TABLET | Freq: Three times a day (TID) | ORAL | Status: DC | PRN

## 2015-01-12 NOTE — Progress Notes (Signed)
Follow up s/p SRS brain mets 10/16/13 MRI results  from 01/09/15, no nausea, last headache Saturday,  From stress,  Appetite good,, has chemotherapy infusion today BP 116/41 mmHg  Pulse 69  Temp(Src) 97.5 F (36.4 C) (Oral)  Resp 16  Ht '5\' 4"'$  (1.626 m)  Wt 133 lb 1.6 oz (60.374 kg)  BMI 22.84 kg/m2  SpO2 99%  Wt Readings from Last 3 Encounters:  01/12/15 133 lb 1.6 oz (60.374 kg)  01/09/15 135 lb (61.236 kg)  12/22/14 132 lb 8 oz (60.102 kg)

## 2015-01-12 NOTE — Telephone Encounter (Signed)
Called and left voice message with Kristin,NP, Dr. [name] with patinet f/u appt will send up as soonas he is done, vitals wnl 9:53 AM

## 2015-01-12 NOTE — Progress Notes (Signed)
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  PRIOR THERAPY:  1) Status post stereotactic radiotherapy to a solitary right parietal brain lesion under the care of Dr. [name] on 10/16/2013. 2) Status post palliative radiotherapy to the right lung tumor under the care of Dr. [name] completed on 12/05/2013. 3) Systemic chemotherapy with carboplatin for AUC of 5 and Alimta 500 mg/M2 every 3 weeks. First dose Jan 06 2014. Status post 6 cycles.  CURRENT THERAPY: Systemic chemotherapy with maintenance Alimta 500 mg/M2 every 3 weeks. Status post 10 cycles.  INTERVAL HISTORY: Alexis [name] 64 y.o. female returns to the clinic today for followup visit accompanied by her husband. The patient is feeling fine today. She is tolerating her maintenance chemotherapy with Alimta fairly well. She denied having any significant chest pain, shortness of breath, cough or hemoptysis. She has no fever or chills, no nausea or vomiting. She denied having any significant weight loss or night sweats. She continues to have difficulty sleeping and intermittent episodes of anxiety. This is currently managed with Xanax, one tablet in the evenings to help her rest and intermittently 1 tablet during the day as needed for anxiety. She had a visit with Radiation Oncology this morning with a repeat MRI of the brain. MRI was stable and a repeat MRI is planned for 3 months. The patient is very happy with this news.  MEDICAL HISTORY: Past Medical  History  Diagnosis Date  . Ileus, postoperative 11/18/2013  . Physical deconditioning 11/18/2013  . Severe protein-calorie malnutrition 11/18/2013  . Anemia 11/18/2013  . History of radiation therapy 10-28-13- 12-05-13    lung ca 50 Gy/78f  . Anxiety   . Hx of radiation therapy 10/16/13    rt parietal brain/20Gy  . 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  MEDICATIONS:  Current Outpatient Prescriptions  Medication Sig Dispense Refill  . acetaminophen (TYLENOL) 500 MG tablet Take 500 mg by mouth every 6 (six) hours as needed for mild pain or headache.    . ALPRAZolam (XANAX) 0.25 MG tablet Take 1 tablet (0.25 mg total) by mouth 3 (three) times daily as needed for anxiety. 45 tablet 0  . Alum & Mag Hydroxide-Simeth (MAGIC MOUTHWASH) SOLN Take 5 mLs by mouth 4 (four) times daily as needed for mouth pain. 160 mL 0  . Ascorbic Acid (VITAMIN C GUMMIE PO) Take 1 each by mouth every morning.    .Marland Kitchendexamethasone (DECADRON) 4 MG tablet 4 mg by mouth twice a day the day before, day of and day after the chemotherapy every 3 weeks 40 tablet 1  . folic acid (FOLVITE) 1 MG tablet Take 1 tablet (1 mg total) by mouth daily. 30 tablet 2  . Multiple Vitamin (MULTIVITAMIN WITH MINERALS) TABS tablet Take 1 tablet by mouth every morning.    . nystatin (MYCOSTATIN) 100000 UNIT/ML suspension   0  . ondansetron (ZOFRAN) 8 MG tablet Take 1  tablet (8 mg total) by mouth every 8 (eight) hours as needed for nausea or vomiting. 30 tablet 0  . OVER THE COUNTER MEDICATION Take 1 tablet by mouth every morning. (Vitamin A)    . pentoxifylline (TRENTAL) 400 MG CR tablet Take 1 tablet (400 mg total) by mouth 2 (two) times daily. Patient is also to take vitamin E 400 mg BID with trental. 60 tablet 2  . prochlorperazine (COMPAZINE) 10 MG tablet Take 1 tablet (10 mg total) by mouth every 6 (six) hours as needed for nausea or vomiting. 60 tablet 0  .  Zinc 50 MG TABS Take 1 tablet by mouth every morning.     No current facility-administered medications for this visit.    SURGICAL HISTORY:  Past Surgical History  Procedure Laterality Date  . Video bronchoscopy Bilateral 08/30/2013    Procedure: VIDEO BRONCHOSCOPY WITH FLUORO;  Surgeon: Tanda Rockers, MD;  Location: [facility];  Service: Cardiopulmonary;  Laterality: Bilateral;  . Abdominal hysterectomy    . Laparotomy N/A 11/03/2013    Procedure: EXPLORATORY LAPAROTOMY, DRAINAGE OF INTRA  ABDOMINAL ABSCESSES, MOBILIZATION OF SPLENIC FLEXURE, SIGMOID COLECTOMY WITH COLOSTOMY;  Surgeon: Odis Hollingshead, MD;  Location: [facility];  Service: General;  Laterality: N/A;  . Colostomy takedown N/A 07/10/2014    Procedure: LAPAROSCOPIC LYSIS OF ADHESIONS (90 MIN) LAPAROSCOPIC ASSISTED COLOSTOMY CLOSURE, RIGID PROCTOSIGMOIDOSCOPY;  Surgeon: Jackolyn Confer, MD;  Location: [facility];  Service: General;  Laterality: N/A;    REVIEW OF SYSTEMS:  Constitutional: positive for fatigue Eyes: negative Ears, nose, mouth, throat, and face: negative Respiratory: negative Cardiovascular: negative Gastrointestinal: negative Genitourinary:negative Integument/breast: negative Hematologic/lymphatic: negative Musculoskeletal:negative Neurological: negative Behavioral/Psych: positive for anxiety and depression Endocrine: negative Allergic/Immunologic: negative   PHYSICAL EXAMINATION: General appearance: alert, cooperative, fatigued and no distress Head: Normocephalic, without obvious abnormality, atraumatic Neck: no adenopathy, no JVD, supple, symmetrical, trachea midline and thyroid not enlarged, symmetric, no tenderness/mass/nodules Lymph nodes: Cervical, supraclavicular, and axillary nodes normal. Resp: clear to auscultation bilaterally Back: symmetric, no curvature. ROM normal. No CVA tenderness. Cardio: regular rate and rhythm, S1, S2 normal, no murmur, click, rub or gallop GI: soft, non-tender; bowel  sounds normal; no masses,  no organomegaly Extremities: extremities normal, atraumatic, no cyanosis or edema Neurologic: Alert and oriented X 3, normal strength and tone. Normal symmetric reflexes. Normal coordination and gait  ECOG PERFORMANCE STATUS: 2 - Symptomatic, <50% confined to bed  Blood pressure 116/41, pulse 69, temperature 97.5 F (36.4 C), temperature source Oral, resp. rate 16, height $RemoveBe'5\' 4"'LYTuvUimw$  (1.626 m), weight 133 lb 1.6 oz (60.374 kg), SpO2 98 %.  LABORATORY DATA: Lab Results  Component Value Date   WBC 7.0 01/12/2015   HGB 11.2* 01/12/2015   HCT 33.4* 01/12/2015   MCV 95.5 01/12/2015   PLT 425* 01/12/2015      Chemistry      Component Value Date/Time   NA 142 01/12/2015 0852   NA 139 07/12/2014 0512   K 4.0 01/12/2015 0852   K 3.9 07/12/2014 0512   CL 100 07/12/2014 0512   CO2 26 01/12/2015 0852   CO2 32 07/12/2014 0512   BUN 13.5 01/12/2015 0852   BUN 5* 07/12/2014 0512   CREATININE 0.7 01/12/2015 0852   CREATININE 0.55 07/12/2014 0512      Component Value Date/Time   CALCIUM 9.6 01/12/2015 0852   CALCIUM 9.0 07/12/2014 0512   ALKPHOS 100 01/12/2015 0852   ALKPHOS 91 07/10/2014 0955   AST 10 01/12/2015 0852   AST  15 07/10/2014 0955   ALT 9 01/12/2015 0852   ALT 8 07/10/2014 0955   BILITOT 0.25 01/12/2015 0852   BILITOT 0.4 07/10/2014 0955       RADIOGRAPHIC STUDIES: Ct Chest W Contrast  12/19/2014   CLINICAL DATA:  Patient with history of right lung cancer. Restaging evaluation.  EXAM: CT CHEST, ABDOMEN, AND PELVIS WITH CONTRAST  TECHNIQUE: Multidetector CT imaging of the chest, abdomen and pelvis was performed following the standard protocol during bolus administration of intravenous contrast.  CONTRAST:  69m OMNIPAQUE IOHEXOL 300 MG/ML  SOLN  COMPARISON:  CT 10/17/2014  FINDINGS: CT CHEST FINDINGS  Mediastinum/Nodes: Visualized thyroid is unremarkable. Unchanged 9 mm right hilar lymph node (image 27; series 2). No axillary or mediastinal  adenopathy. Normal heart size. No pericardial effusion. Vascular calcifications involving the aortic arch.  Lungs/Pleura: Central airways are patent. Re- demonstrated biapical emphysematous change. No significant interval change in size of right upper lobe mass measuring 2.4 x 1.5 cm, previously 2.3 x 1.5 cm. There is a new focus of ground-glass attenuation within the right upper lobe (image 19; series 4). No pleural effusion or pneumothorax.  Musculoskeletal: No aggressive or acute appearing osseous lesions.  CT ABDOMEN AND PELVIS FINDINGS  Hepatobiliary: Liver is normal in size and contour without focal hepatic lesion identified. Gallbladder is unremarkable. No intrahepatic or extrahepatic biliary ductal dilatation.  Pancreas: Unremarkable  Spleen: Unremarkable  Adrenals/Urinary Tract: Normal adrenal glands. Kidneys enhance symmetrically with contrast. No hydronephrosis. Urinary bladder is unremarkable.  Stomach/Bowel: The appendix is normal. No evidence for bowel obstruction. No free fluid or free intraperitoneal air.  Vascular/Lymphatic: Normal caliber abdominal aorta with peripheral atherosclerotic plaque. No retroperitoneal lymphadenopathy.  Other: Patient status post hysterectomy.  Musculoskeletal: No aggressive or acute appearing osseous lesions.  IMPRESSION: No significant interval change in size of right upper lobe pulmonary nodule, compatible with known pulmonary neoplasm.  Interval development of a focal area of ground-glass attenuation within the right upper lobe which is nonspecific in etiology and may be secondary to an infectious and/or inflammatory process. Attention on follow-up imaging is recommended to exclude the possibility of additional sites of disease.   Electronically Signed   By: DLovey NewcomerM.D.   On: 12/19/2014 10:09   Mr BJeri CosWXBContrast  01/09/2015   CLINICAL DATA:  Fourteen month restaging.  Metastatic lung cancer.  EXAM: MRI HEAD WITHOUT AND WITH CONTRAST  TECHNIQUE:  Multiplanar, multiecho pulse sequences of the brain and surrounding structures were obtained without and with intravenous contrast.  CONTRAST:  132mMULTIHANCE GADOBENATE DIMEGLUMINE 529 MG/ML IV SOLN  COMPARISON:  11/07/2014.  07/30/2014.  FINDINGS: Diffusion imaging does not show any evidence of acute or subacute infarction.  There are no new metastatic lesions.  Treated lesion in the right parietal cortical and subcortical brain is unchanged, measuring 8 x 14 mm. The amount of associated white matter T2 signal is unchanged.  Chronic small-vessel ischemic changes affecting the white matter as seen previously. No large vessel territory infarction. No hydrocephalus or extra-axial collection. No pituitary mass. No inflammatory sinus disease. No skull or skullbase lesion.  IMPRESSION: 8 x 14 mm enhancing lesion of the right parietal cortical and subcortical brain, previously treated, is unchanged since the study of 2 months ago. No new lesions.   Electronically Signed   By: MaNelson Chimes.D.   On: 01/09/2015 11:20   Ct Abdomen Pelvis W Contrast  12/19/2014   CLINICAL DATA:  Patient with history of right lung cancer.  Restaging evaluation.  EXAM: CT CHEST, ABDOMEN, AND PELVIS WITH CONTRAST  TECHNIQUE: Multidetector CT imaging of the chest, abdomen and pelvis was performed following the standard protocol during bolus administration of intravenous contrast.  CONTRAST:  97m OMNIPAQUE IOHEXOL 300 MG/ML  SOLN  COMPARISON:  CT 10/17/2014  FINDINGS: CT CHEST FINDINGS  Mediastinum/Nodes: Visualized thyroid is unremarkable. Unchanged 9 mm right hilar lymph node (image 27; series 2). No axillary or mediastinal adenopathy. Normal heart size. No pericardial effusion. Vascular calcifications involving the aortic arch.  Lungs/Pleura: Central airways are patent. Re- demonstrated biapical emphysematous change. No significant interval change in size of right upper lobe mass measuring 2.4 x 1.5 cm, previously 2.3 x 1.5 cm. There is a  new focus of ground-glass attenuation within the right upper lobe (image 19; series 4). No pleural effusion or pneumothorax.  Musculoskeletal: No aggressive or acute appearing osseous lesions.  CT ABDOMEN AND PELVIS FINDINGS  Hepatobiliary: Liver is normal in size and contour without focal hepatic lesion identified. Gallbladder is unremarkable. No intrahepatic or extrahepatic biliary ductal dilatation.  Pancreas: Unremarkable  Spleen: Unremarkable  Adrenals/Urinary Tract: Normal adrenal glands. Kidneys enhance symmetrically with contrast. No hydronephrosis. Urinary bladder is unremarkable.  Stomach/Bowel: The appendix is normal. No evidence for bowel obstruction. No free fluid or free intraperitoneal air.  Vascular/Lymphatic: Normal caliber abdominal aorta with peripheral atherosclerotic plaque. No retroperitoneal lymphadenopathy.  Other: Patient status post hysterectomy.  Musculoskeletal: No aggressive or acute appearing osseous lesions.  IMPRESSION: No significant interval change in size of right upper lobe pulmonary nodule, compatible with known pulmonary neoplasm.  Interval development of a focal area of ground-glass attenuation within the right upper lobe which is nonspecific in etiology and may be secondary to an infectious and/or inflammatory process. Attention on follow-up imaging is recommended to exclude the possibility of additional sites of disease.   Electronically Signed   By: DLovey NewcomerM.D.   On: 12/19/2014 10:09    ASSESSMENT AND PLAN: This is a very pleasant 64year old white female recently diagnosed with a stage IV non-small cell lung cancer, adenocarcinoma with negative EGFR mutation and negative ALK gene translocation. The patient is status post stereotactic radiotherapy to a solitary brain metastases in addition to palliative radiotherapy to the right upper lobe lung mass. She status post 6 cycles of systemic chemotherapy with carboplatin and Alimta with improvement in her disease and she  is currently undergoing maintenance chemotherapy with single agent Alimta, status post 10 cycles and tolerating her treatment fairly well.  The patient was discussed with and also seen by Dr. MJulien Nordmann She will proceed with cycle #11 today as scheduled. She will follow-up in 3 weeks prior to cycle #12.   For anxiety, she is currently on Xanax 0.25 mg by mouth 3 times a day as needed. She was given a refill of her medication today.  The patient was advised to call immediately if she has any concerning symptoms in the interval. The patient voices understanding of current disease status and treatment options and is in agreement with the current care plan.  All questions were answered. The patient knows to call the clinic with any problems, questions or concerns. We can certainly see the patient much sooner if necessary.  CMikey Bussing DNP, AGPCNP-BC, AOCNP 01/12/2015  ADDENDUM: Hematology/Oncology Attending: I had a face to face encounter with the patient. I recommended her care plan. This is a very pleasant 64years old white female with a stage IV non-small cell lung cancer, adenocarcinoma status  post induction chemotherapy and currently undergoing maintenance treatment with single agent Alimta status post 10 cycles. She is tolerating her maintenance treatment fairly well was no significant adverse effects. She was seen recently by Dr. [name] and the recent MRI of the brain showed no evidence for disease progression in the brain. I recommended for the patient to proceed with cycle #11 today as scheduled. She will come back for follow-up visit in 3 weeks for reevaluation before starting cycle #12. She was advised to call immediately if she has any concerning symptoms in the interval.  Disclaimer: This note was dictated with voice recognition software. Similar sounding words can inadvertently be transcribed and may be missed upon review. Eilleen Kempf., MD 01/12/2015

## 2015-01-12 NOTE — Telephone Encounter (Signed)
Gave and printed appt sched and avs for pt for June...added tx.

## 2015-01-12 NOTE — Progress Notes (Signed)
Radiation Oncology         [phone] ________________________________  Name: Alexis [name] MRN: [id number]  Date: 01/12/2015  DOB: 1951/02/19  Follow-Up Visit Note  CC: [name], MD  [name], *  Diagnosis:   Metastatic lung cancer with brain metastasis  Interval Since Last Radiation:  Radiosurgery to a right parietal target completed on 10/16/2013   Narrative:  The patient returns today for routine follow-up. SRS brain mets 10/16/13. MRI results from 01/09/15. Pt denies nausea and her last headache was on Saturday from stress. Pt reports a good appetite and has a chemotherapy infusion today.   ALLERGIES:  is allergic to codeine.  Meds: Current Outpatient Prescriptions  Medication Sig Dispense Refill  . acetaminophen (TYLENOL) 500 MG tablet Take 500 mg by mouth every 6 (six) hours as needed for mild pain or headache.    . ALPRAZolam (XANAX) 0.25 MG tablet Take 1 tablet (0.25 mg total) by mouth 3 (three) times daily as needed for anxiety. 45 tablet 0  . Ascorbic Acid (VITAMIN C GUMMIE PO) Take 1 each by mouth every morning.    Marland Kitchen dexamethasone (DECADRON) 4 MG tablet 4 mg by mouth twice a day the day before, day of and day after the chemotherapy every 3 weeks 40 tablet 1  . folic acid (FOLVITE) 1 MG tablet Take 1 tablet (1 mg total) by mouth daily. 30 tablet 2  . Multiple Vitamin (MULTIVITAMIN WITH MINERALS) TABS tablet Take 1 tablet by mouth every morning.    . ondansetron (ZOFRAN) 8 MG tablet Take 1 tablet (8 mg total) by mouth every 8 (eight) hours as needed for nausea or vomiting. 30 tablet 0  . OVER THE COUNTER MEDICATION Take 1 tablet by mouth every morning. (Vitamin A)    . pentoxifylline (TRENTAL) 400 MG CR tablet Take 1 tablet (400 mg total) by mouth 2 (two) times daily. Patient is also to take vitamin E 400 mg BID with trental. 60 tablet 2  . Zinc 50 MG TABS Take 1 tablet by mouth every morning.    . Alum & Mag Hydroxide-Simeth (MAGIC MOUTHWASH) SOLN Take 5  mLs by mouth 4 (four) times daily as needed for mouth pain. (Patient not taking: Reported on 01/12/2015) 160 mL 0  . nystatin (MYCOSTATIN) 100000 UNIT/ML suspension   0  . oxyCODONE (OXY IR/ROXICODONE) 5 MG immediate release tablet Take 1-2 tablets (5-10 mg total) by mouth every 4 (four) hours as needed for moderate pain. (Patient not taking: Reported on 12/22/2014) 40 tablet 0  . prochlorperazine (COMPAZINE) 10 MG tablet Take 1 tablet (10 mg total) by mouth every 6 (six) hours as needed for nausea or vomiting. (Patient not taking: Reported on 01/12/2015) 60 tablet 0   No current facility-administered medications for this encounter.    Physical Findings: The patient is in no acute distress. Patient is alert and oriented.  height is '5\' 4"'$  (1.626 m) and weight is 133 lb 1.6 oz (60.374 kg). Her oral temperature is 97.5 F (36.4 C). Her blood pressure is 116/41 and her pulse is 69. Her respiration is 16 and oxygen saturation is 99%.    Lab Findings: Lab Results  Component Value Date   WBC 7.0 01/12/2015   HGB 11.2* 01/12/2015   HCT 33.4* 01/12/2015   MCV 95.5 01/12/2015   PLT 425* 01/12/2015     Radiographic Findings: Ct Chest W Contrast  12/19/2014   CLINICAL DATA:  Patient with history of right lung cancer. Restaging evaluation.  EXAM: CT CHEST, ABDOMEN, AND PELVIS WITH CONTRAST  TECHNIQUE: Multidetector CT imaging of the chest, abdomen and pelvis was performed following the standard protocol during bolus administration of intravenous contrast.  CONTRAST:  71m OMNIPAQUE IOHEXOL 300 MG/ML  SOLN  COMPARISON:  CT 10/17/2014  FINDINGS: CT CHEST FINDINGS  Mediastinum/Nodes: Visualized thyroid is unremarkable. Unchanged 9 mm right hilar lymph node (image 27; series 2). No axillary or mediastinal adenopathy. Normal heart size. No pericardial effusion. Vascular calcifications involving the aortic arch.  Lungs/Pleura: Central airways are patent. Re- demonstrated biapical emphysematous change. No  significant interval change in size of right upper lobe mass measuring 2.4 x 1.5 cm, previously 2.3 x 1.5 cm. There is a new focus of ground-glass attenuation within the right upper lobe (image 19; series 4). No pleural effusion or pneumothorax.  Musculoskeletal: No aggressive or acute appearing osseous lesions.  CT ABDOMEN AND PELVIS FINDINGS  Hepatobiliary: Liver is normal in size and contour without focal hepatic lesion identified. Gallbladder is unremarkable. No intrahepatic or extrahepatic biliary ductal dilatation.  Pancreas: Unremarkable  Spleen: Unremarkable  Adrenals/Urinary Tract: Normal adrenal glands. Kidneys enhance symmetrically with contrast. No hydronephrosis. Urinary bladder is unremarkable.  Stomach/Bowel: The appendix is normal. No evidence for bowel obstruction. No free fluid or free intraperitoneal air.  Vascular/Lymphatic: Normal caliber abdominal aorta with peripheral atherosclerotic plaque. No retroperitoneal lymphadenopathy.  Other: Patient status post hysterectomy.  Musculoskeletal: No aggressive or acute appearing osseous lesions.  IMPRESSION: No significant interval change in size of right upper lobe pulmonary nodule, compatible with known pulmonary neoplasm.  Interval development of a focal area of ground-glass attenuation within the right upper lobe which is nonspecific in etiology and may be secondary to an infectious and/or inflammatory process. Attention on follow-up imaging is recommended to exclude the possibility of additional sites of disease.   Electronically Signed   By: DLovey NewcomerM.D.   On: 12/19/2014 10:09   Mr BJeri CosWSAContrast  01/09/2015   CLINICAL DATA:  Fourteen month restaging.  Metastatic lung cancer.  EXAM: MRI HEAD WITHOUT AND WITH CONTRAST  TECHNIQUE: Multiplanar, multiecho pulse sequences of the brain and surrounding structures were obtained without and with intravenous contrast.  CONTRAST:  122mMULTIHANCE GADOBENATE DIMEGLUMINE 529 MG/ML IV SOLN   COMPARISON:  11/07/2014.  07/30/2014.  FINDINGS: Diffusion imaging does not show any evidence of acute or subacute infarction.  There are no new metastatic lesions.  Treated lesion in the right parietal cortical and subcortical brain is unchanged, measuring 8 x 14 mm. The amount of associated white matter T2 signal is unchanged.  Chronic small-vessel ischemic changes affecting the white matter as seen previously. No large vessel territory infarction. No hydrocephalus or extra-axial collection. No pituitary mass. No inflammatory sinus disease. No skull or skullbase lesion.  IMPRESSION: 8 x 14 mm enhancing lesion of the right parietal cortical and subcortical brain, previously treated, is unchanged since the study of 2 months ago. No new lesions.   Electronically Signed   By: MaNelson Chimes.D.   On: 01/09/2015 11:20   Ct Abdomen Pelvis W Contrast  12/19/2014   CLINICAL DATA:  Patient with history of right lung cancer. Restaging evaluation.  EXAM: CT CHEST, ABDOMEN, AND PELVIS WITH CONTRAST  TECHNIQUE: Multidetector CT imaging of the chest, abdomen and pelvis was performed following the standard protocol during bolus administration of intravenous contrast.  CONTRAST:  8063mMNIPAQUE IOHEXOL 300 MG/ML  SOLN  COMPARISON:  CT 10/17/2014  FINDINGS: CT CHEST FINDINGS  Mediastinum/Nodes:  Visualized thyroid is unremarkable. Unchanged 9 mm right hilar lymph node (image 27; series 2). No axillary or mediastinal adenopathy. Normal heart size. No pericardial effusion. Vascular calcifications involving the aortic arch.  Lungs/Pleura: Central airways are patent. Re- demonstrated biapical emphysematous change. No significant interval change in size of right upper lobe mass measuring 2.4 x 1.5 cm, previously 2.3 x 1.5 cm. There is a new focus of ground-glass attenuation within the right upper lobe (image 19; series 4). No pleural effusion or pneumothorax.  Musculoskeletal: No aggressive or acute appearing osseous lesions.  CT  ABDOMEN AND PELVIS FINDINGS  Hepatobiliary: Liver is normal in size and contour without focal hepatic lesion identified. Gallbladder is unremarkable. No intrahepatic or extrahepatic biliary ductal dilatation.  Pancreas: Unremarkable  Spleen: Unremarkable  Adrenals/Urinary Tract: Normal adrenal glands. Kidneys enhance symmetrically with contrast. No hydronephrosis. Urinary bladder is unremarkable.  Stomach/Bowel: The appendix is normal. No evidence for bowel obstruction. No free fluid or free intraperitoneal air.  Vascular/Lymphatic: Normal caliber abdominal aorta with peripheral atherosclerotic plaque. No retroperitoneal lymphadenopathy.  Other: Patient status post hysterectomy.  Musculoskeletal: No aggressive or acute appearing osseous lesions.  IMPRESSION: No significant interval change in size of right upper lobe pulmonary nodule, compatible with known pulmonary neoplasm.  Interval development of a focal area of ground-glass attenuation within the right upper lobe which is nonspecific in etiology and may be secondary to an infectious and/or inflammatory process. Attention on follow-up imaging is recommended to exclude the possibility of additional sites of disease.   Electronically Signed   By: Lovey Newcomer M.D.   On: 12/19/2014 10:09    Impression: No sign of progression of the disease on the MRI scan. No new disease seen. Can continue standard operation for brain metastases.  Plan: Schedule the pt for an MRI every 3 months. The MRI should be reviewed at the brain tumor board and then the pt should follow up for the results. The pt should continue her medications and I have refilled her Vitamin E and Trental.  I spent 15 minutes with the patient today, the majority of which was spent counseling the patient on the diagnosis of cancer and coordinating care.  This document serves as a record of services personally performed by Kyung Rudd, MD. It was created on his behalf by Darcus Austin, a trained medical  scribe. The creation of this record is based on the scribe's personal observations and the provider's statements to them. This document has been checked and approved by the attending provider.     Jodelle Gross, M.D., Ph.D.

## 2015-01-12 NOTE — Patient Instructions (Signed)
[facility] Discharge Instructions for Patients Receiving Chemotherapy  Today you received the following chemotherapy agents Alimta.  To help prevent nausea and vomiting after your treatment, we encourage you to take your nausea medication as prescribed.   If you develop nausea and vomiting that is not controlled by your nausea medication, call the clinic.   BELOW ARE SYMPTOMS THAT SHOULD BE REPORTED IMMEDIATELY:  *FEVER GREATER THAN 100.5 F  *CHILLS WITH OR WITHOUT FEVER  NAUSEA AND VOMITING THAT IS NOT CONTROLLED WITH YOUR NAUSEA MEDICATION  *UNUSUAL SHORTNESS OF BREATH  *UNUSUAL BRUISING OR BLEEDING  TENDERNESS IN MOUTH AND THROAT WITH OR WITHOUT PRESENCE OF ULCERS  *URINARY PROBLEMS  *BOWEL PROBLEMS  UNUSUAL RASH Items with * indicate a potential emergency and should be followed up as soon as possible.  Feel free to call the clinic you have any questions or concerns. The clinic phone number is [phone].  Please show the CHEMO ALERT CARD at check-in to the Emergency Department and triage nurse.   

## 2015-02-02 ENCOUNTER — Other Ambulatory Visit: Payer: Self-pay | Admitting: Oncology

## 2015-02-02 ENCOUNTER — Encounter: Payer: Self-pay | Admitting: Physician Assistant

## 2015-02-02 ENCOUNTER — Other Ambulatory Visit (HOSPITAL_BASED_OUTPATIENT_CLINIC_OR_DEPARTMENT_OTHER): Payer: BLUE CROSS/BLUE SHIELD

## 2015-02-02 ENCOUNTER — Other Ambulatory Visit: Payer: Self-pay | Admitting: *Deleted

## 2015-02-02 ENCOUNTER — Ambulatory Visit (HOSPITAL_BASED_OUTPATIENT_CLINIC_OR_DEPARTMENT_OTHER): Payer: BLUE CROSS/BLUE SHIELD | Admitting: Physician Assistant

## 2015-02-02 ENCOUNTER — Ambulatory Visit (HOSPITAL_BASED_OUTPATIENT_CLINIC_OR_DEPARTMENT_OTHER): Payer: BLUE CROSS/BLUE SHIELD

## 2015-02-02 ENCOUNTER — Telehealth: Payer: Self-pay | Admitting: Physician Assistant

## 2015-02-02 VITALS — BP 106/70 | HR 66 | Temp 98.0°F | Resp 18 | Ht 64.0 in | Wt 134.9 lb

## 2015-02-02 DIAGNOSIS — C7949 Secondary malignant neoplasm of other parts of nervous system: Principal | ICD-10-CM

## 2015-02-02 DIAGNOSIS — C3411 Malignant neoplasm of upper lobe, right bronchus or lung: Secondary | ICD-10-CM

## 2015-02-02 DIAGNOSIS — Z5111 Encounter for antineoplastic chemotherapy: Secondary | ICD-10-CM

## 2015-02-02 DIAGNOSIS — C7931 Secondary malignant neoplasm of brain: Secondary | ICD-10-CM

## 2015-02-02 DIAGNOSIS — F419 Anxiety disorder, unspecified: Secondary | ICD-10-CM

## 2015-02-02 DIAGNOSIS — C3410 Malignant neoplasm of upper lobe, unspecified bronchus or lung: Secondary | ICD-10-CM

## 2015-02-02 LAB — COMPREHENSIVE METABOLIC PANEL (CC13)
ALT: 6 U/L (ref 0–55)
AST: 12 U/L (ref 5–34)
Albumin: 3.5 g/dL (ref 3.5–5.0)
Alkaline Phosphatase: 90 U/L (ref 40–150)
Anion Gap: 9 mEq/L (ref 3–11)
BUN: 17.5 mg/dL (ref 7.0–26.0)
CO2: 25 mEq/L (ref 22–29)
Calcium: 9.5 mg/dL (ref 8.4–10.4)
Chloride: 105 mEq/L (ref 98–109)
Creatinine: 0.8 mg/dL (ref 0.6–1.1)
EGFR: 83 mL/min/{1.73_m2} — ABNORMAL LOW (ref >90)
Glucose: 121 mg/dl (ref 70–140)
Potassium: 4 mEq/L (ref 3.5–5.1)
Sodium: 139 mEq/L (ref 136–145)
Total Bilirubin: 0.35 mg/dL (ref 0.20–1.20)
Total Protein: 6.9 g/dL (ref 6.4–8.3)

## 2015-02-02 LAB — CBC WITH DIFFERENTIAL/PLATELET
BASO%: 0.3 % (ref 0.0–2.0)
Basophils Absolute: 0 10*3/uL (ref 0.0–0.1)
EOS%: 0 % (ref 0.0–7.0)
Eosinophils Absolute: 0 10*3/uL (ref 0.0–0.5)
HCT: 35.9 % (ref 34.8–46.6)
HGB: 11.8 g/dL (ref 11.6–15.9)
LYMPH%: 6 % — ABNORMAL LOW (ref 14.0–49.7)
MCH: 31.3 pg (ref 25.1–34.0)
MCHC: 33 g/dL (ref 31.5–36.0)
MCV: 94.9 fL (ref 79.5–101.0)
MONO#: 0.3 10*3/uL (ref 0.1–0.9)
MONO%: 4.3 % (ref 0.0–14.0)
NEUT#: 6.1 10*3/uL (ref 1.5–6.5)
NEUT%: 89.4 % — ABNORMAL HIGH (ref 38.4–76.8)
Platelets: 285 10*3/uL (ref 145–400)
RBC: 3.78 10*6/uL (ref 3.70–5.45)
RDW: 15.2 % — ABNORMAL HIGH (ref 11.2–14.5)
WBC: 6.8 10*3/uL (ref 3.9–10.3)
lymph#: 0.4 10*3/uL — ABNORMAL LOW (ref 0.9–3.3)

## 2015-02-02 MED ORDER — SODIUM CHLORIDE 0.9 % IV SOLN
Freq: Once | INTRAVENOUS | Status: AC
  Administered 2015-02-02: 11:00:00 via INTRAVENOUS

## 2015-02-02 MED ORDER — SODIUM CHLORIDE 0.9 % IJ SOLN
10.0000 mL | INTRAMUSCULAR | Status: DC | PRN
  Filled 2015-02-02: qty 10

## 2015-02-02 MED ORDER — HEPARIN SOD (PORK) LOCK FLUSH 100 UNIT/ML IV SOLN
250.0000 [IU] | Freq: Once | INTRAVENOUS | Status: DC | PRN
  Filled 2015-02-02: qty 5

## 2015-02-02 MED ORDER — HEPARIN SOD (PORK) LOCK FLUSH 100 UNIT/ML IV SOLN
500.0000 [IU] | Freq: Once | INTRAVENOUS | Status: DC | PRN
  Filled 2015-02-02: qty 5

## 2015-02-02 MED ORDER — SODIUM CHLORIDE 0.9 % IJ SOLN
3.0000 mL | INTRAMUSCULAR | Status: DC | PRN
  Filled 2015-02-02: qty 10

## 2015-02-02 MED ORDER — CYANOCOBALAMIN 1000 MCG/ML IJ SOLN
1000.0000 ug | Freq: Once | INTRAMUSCULAR | Status: AC
  Administered 2015-02-02: 1000 ug via INTRAMUSCULAR

## 2015-02-02 MED ORDER — SODIUM CHLORIDE 0.9 % IV SOLN
500.0000 mg/m2 | Freq: Once | INTRAVENOUS | Status: AC
  Administered 2015-02-02: 800 mg via INTRAVENOUS
  Filled 2015-02-02: qty 32

## 2015-02-02 MED ORDER — SODIUM CHLORIDE 0.9 % IV SOLN: 500.0000 mg/m2 | Freq: Once | INTRAVENOUS | Status: DC

## 2015-02-02 MED ORDER — SODIUM CHLORIDE 0.9 % IV SOLN
Freq: Once | INTRAVENOUS | Status: AC
  Administered 2015-02-02: 11:00:00 via INTRAVENOUS
  Filled 2015-02-02: qty 4

## 2015-02-02 MED ORDER — ALTEPLASE 2 MG IJ SOLR
2.0000 mg | Freq: Once | INTRAMUSCULAR | Status: DC | PRN
  Filled 2015-02-02: qty 2

## 2015-02-02 MED ORDER — CYANOCOBALAMIN 1000 MCG/ML IJ SOLN
INTRAMUSCULAR | Status: AC
  Filled 2015-02-02: qty 1

## 2015-02-02 NOTE — Telephone Encounter (Signed)
per pof to [facility] pt appt-sent [facility] email to adv no openigs per Adrena's pof. Adv pt will calll once reply-Melissa gave pt contrast for scans

## 2015-02-02 NOTE — Patient Instructions (Signed)
Follow up in 3 weeks with a restaging CT scan of your chest, abdomen and pelvis to re-evaluate your disease 

## 2015-02-02 NOTE — Progress Notes (Signed)
[place] [phone]   Fax:([phone]) [place], MD [address] [place] [address]  DIAGNOSIS: Stage IV (T2a, N0, M1b) non-small cell lung cancer consistent with adenocarcinoma with negative EGFR mutation and negative ALK gene translocation diagnosed in January of 2015 presented with right upper lobe lung mass in addition to a solitary brain metastasis status post stereotactic to a solitary brain metastasis as well as radiotherapy to the right upper lobe lung mass.  PRIOR THERAPY:  1) Status post stereotactic radiotherapy to a solitary right parietal brain lesion under the care of Dr. [name] on 10/16/2013. 2) Status post palliative radiotherapy to the right lung tumor under the care of Dr. [name] completed on 12/05/2013. 3) Systemic chemotherapy with carboplatin for AUC of 5 and Alimta 500 mg/M2 every 3 weeks. First dose Jan 06 2014. Status post 6 cycles.  CURRENT THERAPY: Systemic chemotherapy with maintenance Alimta 500 mg/M2 every 3 weeks. Status post 11 cycles.  INTERVAL HISTORY: Alexis [name] 64 y.o. female returns to the clinic today for followup visit accompanied by her husband. The patient is feeling fine today. She is tolerating her maintenance chemotherapy with Alimta fairly well. She denied having any significant chest pain, shortness of breath, cough or hemoptysis. She has no fever or chills, no nausea or vomiting. She denied having any significant weight loss or night sweats. She continues to have difficulty sleeping and intermittent episodes of anxiety. This is currently managed with Xanax, one tablet in the evenings to help her rest and intermittently 1 tablet during the day as needed for anxiety.   MEDICAL HISTORY:She had a visit with Radiation Oncology this morning with a repeat MRI of the brain. MRI was stable and a repeat MRI is planned for 3 months. The patient is very happy with this news.   Past Medical  History  Diagnosis Date  . Ileus, postoperative 11/18/2013  . Physical deconditioning 11/18/2013  . Severe protein-calorie malnutrition 11/18/2013  . Anemia 11/18/2013  . History of radiation therapy 10-28-13- 12-05-13    lung ca 50 Gy/35f  . Anxiety   . Hx of radiation therapy 10/16/13    rt parietal brain/20Gy  . 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  MEDICATIONS:  Current Outpatient Prescriptions  Medication Sig Dispense Refill  . acetaminophen (TYLENOL) 500 MG tablet Take 500 mg by mouth every 6 (six) hours as needed for mild pain or headache.    . ALPRAZolam (XANAX) 0.25 MG tablet Take 1 tablet (0.25 mg total) by mouth 3 (three) times daily as needed for anxiety. 45 tablet 0  . Alum & Mag Hydroxide-Simeth (MAGIC MOUTHWASH) SOLN Take 5 mLs by mouth 4 (four) times daily as needed for mouth pain. 160 mL 0  . Ascorbic Acid (VITAMIN C GUMMIE PO) Take 1 each by mouth every morning.    .Marland Kitchendexamethasone (DECADRON) 4 MG tablet 4 mg by mouth twice a day the day before, day of and day after the chemotherapy every 3 weeks 40 tablet 1  . folic acid (FOLVITE) 1 MG tablet Take 1 tablet (1 mg total) by mouth daily. 30 tablet 2  . Multiple Vitamin (MULTIVITAMIN WITH MINERALS) TABS tablet Take 1 tablet by mouth every morning.    . nystatin (MYCOSTATIN) 100000 UNIT/ML suspension   0  . ondansetron (ZOFRAN) 8 MG tablet  Take 1 tablet (8 mg total) by mouth every 8 (eight) hours as needed for nausea or vomiting. 30 tablet 0  . OVER THE COUNTER MEDICATION Take 1 tablet by mouth every morning. (Vitamin A)    . pentoxifylline (TRENTAL) 400 MG CR tablet Take 1 tablet (400 mg total) by mouth 2 (two) times daily. Patient is also to take vitamin E 400 mg BID with trental. 60 tablet 3  . prochlorperazine (COMPAZINE) 10 MG tablet Take 1 tablet (10 mg total) by mouth every 6 (six) hours as needed for nausea or vomiting. 60 tablet 0  .  Zinc 50 MG TABS Take 1 tablet by mouth every morning.     No current facility-administered medications for this visit.    SURGICAL HISTORY:  Past Surgical History  Procedure Laterality Date  . Video bronchoscopy Bilateral 08/30/2013    Procedure: VIDEO BRONCHOSCOPY WITH FLUORO;  Surgeon: Tanda Rockers, MD;  Location: [facility];  Service: Cardiopulmonary;  Laterality: Bilateral;  . Abdominal hysterectomy    . Laparotomy N/A 11/03/2013    Procedure: EXPLORATORY LAPAROTOMY, DRAINAGE OF INTRA  ABDOMINAL ABSCESSES, MOBILIZATION OF SPLENIC FLEXURE, SIGMOID COLECTOMY WITH COLOSTOMY;  Surgeon: Odis Hollingshead, MD;  Location: [facility];  Service: General;  Laterality: N/A;  . Colostomy takedown N/A 07/10/2014    Procedure: LAPAROSCOPIC LYSIS OF ADHESIONS (90 MIN) LAPAROSCOPIC ASSISTED COLOSTOMY CLOSURE, RIGID PROCTOSIGMOIDOSCOPY;  Surgeon: Jackolyn Confer, MD;  Location: [facility];  Service: General;  Laterality: N/A;    REVIEW OF SYSTEMS:  Constitutional: positive for fatigue Eyes: negative Ears, nose, mouth, throat, and face: negative Respiratory: negative Cardiovascular: negative Gastrointestinal: negative Genitourinary:negative Integument/breast: negative Hematologic/lymphatic: negative Musculoskeletal:negative Neurological: negative Behavioral/Psych: positive for anxiety and depression Endocrine: negative Allergic/Immunologic: negative   PHYSICAL EXAMINATION: General appearance: alert, cooperative, fatigued and no distress Head: Normocephalic, without obvious abnormality, atraumatic Neck: no adenopathy, no JVD, supple, symmetrical, trachea midline and thyroid not enlarged, symmetric, no tenderness/mass/nodules Lymph nodes: Cervical, supraclavicular, and axillary nodes normal. Resp: clear to auscultation bilaterally Back: symmetric, no curvature. ROM normal. No CVA tenderness. Cardio: regular rate and rhythm, S1, S2 normal, no murmur, click, rub or gallop GI: soft, non-tender; bowel  sounds normal; no masses,  no organomegaly Extremities: extremities normal, atraumatic, no cyanosis or edema Neurologic: Alert and oriented X 3, normal strength and tone. Normal symmetric reflexes. Normal coordination and gait  ECOG PERFORMANCE STATUS: 2 - Symptomatic, <50% confined to bed  Blood pressure 106/70, pulse 66, temperature 98 F (36.7 C), temperature source Oral, resp. rate 18, height _0  (1.626 m), weight 134 lb 14.4 oz (61.19 kg), SpO2 99 %.  LABORATORY DATA: Lab Results  Component Value Date   WBC 6.8 02/02/2015   HGB 11.8 02/02/2015   HCT 35.9 02/02/2015   MCV 94.9 02/02/2015   PLT 285 02/02/2015      Chemistry      Component Value Date/Time   NA 139 02/02/2015 0919   NA 139 07/12/2014 0512   K 4.0 02/02/2015 0919   K 3.9 07/12/2014 0512   CL 100 07/12/2014 0512   CO2 25 02/02/2015 0919   CO2 32 07/12/2014 0512   BUN 17.5 02/02/2015 0919   BUN 5* 07/12/2014 0512   CREATININE 0.8 02/02/2015 0919   CREATININE 0.55 07/12/2014 0512      Component Value Date/Time   CALCIUM 9.5 02/02/2015 0919   CALCIUM 9.0 07/12/2014 0512   ALKPHOS 90 02/02/2015 0919   ALKPHOS 91 07/10/2014 0955   AST 12 02/02/2015 0919  AST 15 07/10/2014 0955   ALT 6 02/02/2015 0919   ALT 8 07/10/2014 0955   BILITOT 0.35 02/02/2015 0919   BILITOT 0.4 07/10/2014 0955       RADIOGRAPHIC STUDIES: Mr [name] Wo Contrast  02/06/15   CLINICAL DATA:  Fourteen month restaging.  Metastatic lung cancer.  EXAM: MRI HEAD WITHOUT AND WITH CONTRAST  TECHNIQUE: Multiplanar, multiecho pulse sequences of the brain and surrounding structures were obtained without and with intravenous contrast.  CONTRAST:  60m MULTIHANCE GADOBENATE DIMEGLUMINE 529 MG/ML IV SOLN  COMPARISON:  11/07/2014.  07/30/2014.  FINDINGS: Diffusion imaging does not show any evidence of acute or subacute infarction.  There are no new metastatic lesions.  Treated lesion in the right parietal cortical and subcortical brain is  unchanged, measuring 8 x 14 mm. The amount of associated white matter T2 signal is unchanged.  Chronic small-vessel ischemic changes affecting the white matter as seen previously. No large vessel territory infarction. No hydrocephalus or extra-axial collection. No pituitary mass. No inflammatory sinus disease. No skull or skullbase lesion.  IMPRESSION: 8 x 14 mm enhancing lesion of the right parietal cortical and subcortical brain, previously treated, is unchanged since the study of 2 months ago. No new lesions.   Electronically Signed   By: MNelson ChimesM.D.   On: 010-Jun-201611:20    ASSESSMENT AND PLAN: This is a very pleasant 64year old white female recently diagnosed with a stage IV non-small cell lung cancer, adenocarcinoma with negative EGFR mutation and negative ALK gene translocation. The patient is status post stereotactic radiotherapy to a solitary brain metastases in addition to palliative radiotherapy to the right upper lobe lung mass. She status post 6 cycles of systemic chemotherapy with carboplatin and Alimta with improvement in her disease and she is currently undergoing maintenance chemotherapy with single agent Alimta, status post 11 cycles and tolerating her treatment fairly well.   She will proceed with cycle #12 today as scheduled. She will follow-up in 3 weeks prior to cycle #13 with a restaging CT scan of the chest, abdomen and pelvis with contrast to reevaluate her disease..   For anxiety, she is currently on Xanax 0.25 mg by mouth 3 times a day as needed.   The patient was advised to call immediately if she has any concerning symptoms in the interval. The patient voices understanding of current disease status and treatment options and is in agreement with the current care plan.  All questions were answered. The patient knows to call the clinic with any problems, questions or concerns. We can certainly see the patient much sooner if necessary.  JCarlton Adam PA-C    02/02/2015

## 2015-02-03 ENCOUNTER — Telehealth: Payer: Self-pay | Admitting: *Deleted

## 2015-02-03 ENCOUNTER — Telehealth: Payer: Self-pay | Admitting: Internal Medicine

## 2015-02-03 ENCOUNTER — Telehealth: Payer: Self-pay | Admitting: Physician Assistant

## 2015-02-03 NOTE — Telephone Encounter (Signed)
Per staff message and POF I have scheduled appts. Advised scheduler of appts. JMW  

## 2015-02-03 NOTE — Telephone Encounter (Signed)
per reply from [place] to sch pt on 6/28 '@10'$ .Sent email to MW to sch trmt-will call pt after reply

## 2015-02-03 NOTE — Telephone Encounter (Signed)
per reply from MM to sch pt appt on 6/28-per reply from [facility] trmt sch-cld & spoke to pt and adv of time & dtae

## 2015-02-20 ENCOUNTER — Encounter (HOSPITAL_COMMUNITY): Payer: Self-pay

## 2015-02-20 ENCOUNTER — Ambulatory Visit (HOSPITAL_COMMUNITY)
Admission: RE | Admit: 2015-02-20 | Discharge: 2015-02-20 | Disposition: A | Discharge status: Home / Self Care | Payer: BLUE CROSS/BLUE SHIELD | Source: Ambulatory Visit | Attending: Physician Assistant | Admitting: Physician Assistant

## 2015-02-20 DIAGNOSIS — R911 Solitary pulmonary nodule: Secondary | ICD-10-CM | POA: Diagnosis not present

## 2015-02-20 DIAGNOSIS — Z79899 Other long term (current) drug therapy: Secondary | ICD-10-CM | POA: Diagnosis not present

## 2015-02-20 DIAGNOSIS — J439 Emphysema, unspecified: Secondary | ICD-10-CM | POA: Diagnosis not present

## 2015-02-20 DIAGNOSIS — C3410 Malignant neoplasm of upper lobe, unspecified bronchus or lung: Secondary | ICD-10-CM | POA: Insufficient documentation

## 2015-02-20 DIAGNOSIS — C3411 Malignant neoplasm of upper lobe, right bronchus or lung: Secondary | ICD-10-CM

## 2015-02-20 DIAGNOSIS — Z08 Encounter for follow-up examination after completed treatment for malignant neoplasm: Secondary | ICD-10-CM | POA: Diagnosis not present

## 2015-02-20 MED ORDER — IOHEXOL 300 MG/ML  SOLN
100.0000 mL | Freq: Once | INTRAMUSCULAR | Status: AC | PRN
  Administered 2015-02-20: 100 mL via INTRAVENOUS

## 2015-02-23 ENCOUNTER — Other Ambulatory Visit: Payer: BLUE CROSS/BLUE SHIELD

## 2015-02-23 ENCOUNTER — Ambulatory Visit: Payer: BLUE CROSS/BLUE SHIELD

## 2015-02-23 ENCOUNTER — Ambulatory Visit: Payer: BLUE CROSS/BLUE SHIELD | Admitting: Internal Medicine

## 2015-02-24 ENCOUNTER — Encounter: Payer: Self-pay | Admitting: *Deleted

## 2015-02-24 ENCOUNTER — Ambulatory Visit (HOSPITAL_BASED_OUTPATIENT_CLINIC_OR_DEPARTMENT_OTHER): Payer: BLUE CROSS/BLUE SHIELD

## 2015-02-24 ENCOUNTER — Other Ambulatory Visit (HOSPITAL_BASED_OUTPATIENT_CLINIC_OR_DEPARTMENT_OTHER): Payer: BLUE CROSS/BLUE SHIELD

## 2015-02-24 ENCOUNTER — Other Ambulatory Visit: Payer: Self-pay | Admitting: Medical Oncology

## 2015-02-24 ENCOUNTER — Encounter: Payer: Self-pay | Admitting: Internal Medicine

## 2015-02-24 ENCOUNTER — Ambulatory Visit (HOSPITAL_BASED_OUTPATIENT_CLINIC_OR_DEPARTMENT_OTHER): Payer: BLUE CROSS/BLUE SHIELD | Admitting: Internal Medicine

## 2015-02-24 ENCOUNTER — Telehealth: Payer: Self-pay | Admitting: Internal Medicine

## 2015-02-24 VITALS — BP 95/79 | HR 66 | Temp 98.5°F | Resp 18 | Ht 64.0 in | Wt 137.5 lb

## 2015-02-24 DIAGNOSIS — C3410 Malignant neoplasm of upper lobe, unspecified bronchus or lung: Secondary | ICD-10-CM

## 2015-02-24 DIAGNOSIS — C7931 Secondary malignant neoplasm of brain: Secondary | ICD-10-CM

## 2015-02-24 DIAGNOSIS — Z5111 Encounter for antineoplastic chemotherapy: Secondary | ICD-10-CM

## 2015-02-24 DIAGNOSIS — F339 Major depressive disorder, recurrent, unspecified: Secondary | ICD-10-CM

## 2015-02-24 DIAGNOSIS — C3411 Malignant neoplasm of upper lobe, right bronchus or lung: Secondary | ICD-10-CM

## 2015-02-24 DIAGNOSIS — F419 Anxiety disorder, unspecified: Secondary | ICD-10-CM

## 2015-02-24 DIAGNOSIS — C7949 Secondary malignant neoplasm of other parts of nervous system: Secondary | ICD-10-CM

## 2015-02-24 LAB — CBC WITH DIFFERENTIAL/PLATELET
BASO%: 0 % (ref 0.0–2.0)
Basophils Absolute: 0 10*3/uL (ref 0.0–0.1)
EOS%: 0 % (ref 0.0–7.0)
Eosinophils Absolute: 0 10*3/uL (ref 0.0–0.5)
HCT: 37.3 % (ref 34.8–46.6)
HGB: 12.2 g/dL (ref 11.6–15.9)
LYMPH%: 4.9 % — ABNORMAL LOW (ref 14.0–49.7)
MCH: 32.1 pg (ref 25.1–34.0)
MCHC: 32.7 g/dL (ref 31.5–36.0)
MCV: 98.2 fL (ref 79.5–101.0)
MONO#: 0.3 10*3/uL (ref 0.1–0.9)
MONO%: 4.2 % (ref 0.0–14.0)
NEUT#: 6.1 10*3/uL (ref 1.5–6.5)
NEUT%: 90.9 % — ABNORMAL HIGH (ref 38.4–76.8)
Platelets: 292 10*3/uL (ref 145–400)
RBC: 3.8 10*6/uL (ref 3.70–5.45)
RDW: 15.3 % — ABNORMAL HIGH (ref 11.2–14.5)
WBC: 6.7 10*3/uL (ref 3.9–10.3)
lymph#: 0.3 10*3/uL — ABNORMAL LOW (ref 0.9–3.3)

## 2015-02-24 LAB — COMPREHENSIVE METABOLIC PANEL (CC13)
ALT: 10 U/L (ref 0–55)
AST: 12 U/L (ref 5–34)
Albumin: 3.6 g/dL (ref 3.5–5.0)
Alkaline Phosphatase: 94 U/L (ref 40–150)
Anion Gap: 10 mEq/L (ref 3–11)
BUN: 11.8 mg/dL (ref 7.0–26.0)
CO2: 24 mEq/L (ref 22–29)
Calcium: 9.2 mg/dL (ref 8.4–10.4)
Chloride: 106 mEq/L (ref 98–109)
Creatinine: 0.7 mg/dL (ref 0.6–1.1)
EGFR: 86 mL/min/{1.73_m2} — ABNORMAL LOW (ref >90)
Glucose: 133 mg/dl (ref 70–140)
Potassium: 4 mEq/L (ref 3.5–5.1)
Sodium: 140 mEq/L (ref 136–145)
Total Bilirubin: 0.46 mg/dL (ref 0.20–1.20)
Total Protein: 6.9 g/dL (ref 6.4–8.3)

## 2015-02-24 MED ORDER — SODIUM CHLORIDE 0.9 % IV SOLN
500.0000 mg/m2 | Freq: Once | INTRAVENOUS | Status: AC
  Administered 2015-02-24: 800 mg via INTRAVENOUS
  Filled 2015-02-24: qty 32

## 2015-02-24 MED ORDER — ALPRAZOLAM 0.25 MG PO TABS: 0.2500 mg | ORAL_TABLET | Freq: Three times a day (TID) | ORAL | Status: DC | PRN

## 2015-02-24 MED ORDER — SODIUM CHLORIDE 0.9 % IV SOLN
Freq: Once | INTRAVENOUS | Status: AC
  Administered 2015-02-24: 11:00:00 via INTRAVENOUS

## 2015-02-24 MED ORDER — DEXAMETHASONE 4 MG PO TABS: ORAL_TABLET | ORAL | Status: DC

## 2015-02-24 MED ORDER — SODIUM CHLORIDE 0.9 % IV SOLN
Freq: Once | INTRAVENOUS | Status: AC
  Administered 2015-02-24: 12:00:00 via INTRAVENOUS
  Filled 2015-02-24: qty 4

## 2015-02-24 NOTE — Telephone Encounter (Signed)
Gave and printed appt sched and avs for pt for July and Aug °

## 2015-02-24 NOTE — Progress Notes (Signed)
Oncology Nurse Navigator Documentation  Oncology Nurse Navigator Flowsheets 02/24/2015  Navigator Encounter Type 6 month  Patient Visit Type Medonc  Treatment Phase Treatment/Spoke with patient and husband today.  Patient was given results form recent CT scan.  Patient stated it was good news.  She has no current side effects of chemotherapy.  She states she is feeling good.    Barriers/Navigation Needs No barriers at this time  Support Groups/Services Friends and Family  Time Spent with Patient 15

## 2015-02-24 NOTE — Progress Notes (Signed)
Alexis [name] [phone]   Fax:([phone]) [place], MD [address] [place] [address]  DIAGNOSIS: Stage IV (T2a, N0, M1b) non-small cell lung cancer consistent with adenocarcinoma with negative EGFR mutation and negative ALK gene translocation diagnosed in January of 2015 presented with right upper lobe lung mass in addition to a solitary brain metastasis status post stereotactic to a solitary brain metastasis as well as radiotherapy to the right upper lobe lung mass.  PRIOR THERAPY:  1) Status post stereotactic radiotherapy to a solitary right parietal brain lesion under the care of Dr. [name] on 10/16/2013. 2) Status post palliative radiotherapy to the right lung tumor under the care of Dr. [name] completed on 12/05/2013. 3) Systemic chemotherapy with carboplatin for AUC of 5 and Alimta 500 mg/M2 every 3 weeks. First dose Jan 06 2014. Status post 6 cycles.  CURRENT THERAPY: Systemic chemotherapy with maintenance Alimta 500 mg/M2 every 3 weeks. Status post 9 cycles.  INTERVAL HISTORY: Alexis [name] 64 y.o. female returns to the clinic today for followup visit accompanied by her husband. The patient is feeling fine today. She is tolerating her maintenance chemotherapy with Alimta fairly well. She denied having any significant chest pain, shortness of breath, cough or hemoptysis. She has no fever or chills, no nausea or vomiting. She denied having any significant weight loss or night sweats. She had repeat CT scan of the chest, abdomen and pelvis performed recently and she is here for evaluation and discussion of her scan results.  MEDICAL HISTORY: Past Medical History  Diagnosis Date  . Ileus, postoperative 11/18/2013  . Physical deconditioning 11/18/2013  . Severe protein-calorie malnutrition 11/18/2013  . Anemia 11/18/2013  . History of radiation therapy 10-28-13- 12-05-13    lung ca 50 Gy/20f  . Anxiety   . Hx of radiation  therapy 10/16/13    rt parietal brain/20Gy  . 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  MEDICATIONS:  Current Outpatient Prescriptions  Medication Sig Dispense Refill  . acetaminophen (TYLENOL) 500 MG tablet Take 500 mg by mouth every 6 (six) hours as needed for mild pain or headache.    . ALPRAZolam (XANAX) 0.25 MG tablet Take 1 tablet (0.25 mg total) by mouth 3 (three) times daily as needed for anxiety. 45 tablet 0  . Ascorbic Acid (VITAMIN C GUMMIE PO) Take 1 each by mouth every morning.    .Marland Kitchendexamethasone (DECADRON) 4 MG tablet 4 mg by mouth twice a day the day before, day of and day after the chemotherapy every 3 weeks 40 tablet 1  . folic acid (FOLVITE) 1 MG tablet Take 1 tablet (1 mg total) by mouth daily. 30 tablet 2  . Multiple Vitamin (MULTIVITAMIN WITH MINERALS) TABS tablet Take 1 tablet by mouth every morning.    . ondansetron (ZOFRAN) 8 MG tablet Take 1 tablet (8 mg total) by mouth every 8 (eight) hours as needed for nausea or vomiting. 30 tablet 0  . OVER THE COUNTER MEDICATION Take 1 tablet by mouth every morning. (Vitamin A)    . pentoxifylline (TRENTAL) 400 MG CR tablet Take 1 tablet (400 mg total) by mouth 2 (two) times daily. Patient is also to take vitamin E 400 mg BID with trental. 60 tablet 3  . Zinc 50 MG TABS Take 1 tablet by mouth every morning.    .Marland Kitchen  Alum & Mag Hydroxide-Simeth (MAGIC MOUTHWASH) SOLN Take 5 mLs by mouth 4 (four) times daily as needed for mouth pain. (Patient not taking: Reported on 02/24/2015) 160 mL 0  . prochlorperazine (COMPAZINE) 10 MG tablet Take 1 tablet (10 mg total) by mouth every 6 (six) hours as needed for nausea or vomiting. (Patient not taking: Reported on 02/24/2015) 60 tablet 0   No current facility-administered medications for this visit.    SURGICAL HISTORY:  Past Surgical History  Procedure Laterality Date  . Video bronchoscopy Bilateral 08/30/2013     Procedure: VIDEO BRONCHOSCOPY WITH FLUORO;  Surgeon: Tanda Rockers, MD;  Location: [facility];  Service: Cardiopulmonary;  Laterality: Bilateral;  . Abdominal hysterectomy    . Laparotomy N/A 11/03/2013    Procedure: EXPLORATORY LAPAROTOMY, DRAINAGE OF INTRA  ABDOMINAL ABSCESSES, MOBILIZATION OF SPLENIC FLEXURE, SIGMOID COLECTOMY WITH COLOSTOMY;  Surgeon: Odis Hollingshead, MD;  Location: [facility];  Service: General;  Laterality: N/A;  . Colostomy takedown N/A 07/10/2014    Procedure: LAPAROSCOPIC LYSIS OF ADHESIONS (90 MIN) LAPAROSCOPIC ASSISTED COLOSTOMY CLOSURE, RIGID PROCTOSIGMOIDOSCOPY;  Surgeon: Jackolyn Confer, MD;  Location: [facility];  Service: General;  Laterality: N/A;    REVIEW OF SYSTEMS:  Constitutional: negative Eyes: negative Ears, nose, mouth, throat, and face: negative Respiratory: negative Cardiovascular: negative Gastrointestinal: negative Genitourinary:negative Integument/breast: negative Hematologic/lymphatic: negative Musculoskeletal:negative Neurological: negative Behavioral/Psych: negative Endocrine: negative Allergic/Immunologic: negative   PHYSICAL EXAMINATION: General appearance: alert, cooperative, fatigued and no distress Head: Normocephalic, without obvious abnormality, atraumatic Neck: no adenopathy, no JVD, supple, symmetrical, trachea midline and thyroid not enlarged, symmetric, no tenderness/mass/nodules Lymph nodes: Cervical, supraclavicular, and axillary nodes normal. Resp: clear to auscultation bilaterally Back: symmetric, no curvature. ROM normal. No CVA tenderness. Cardio: regular rate and rhythm, S1, S2 normal, no murmur, click, rub or gallop GI: soft, non-tender; bowel sounds normal; no masses,  no organomegaly Extremities: extremities normal, atraumatic, no cyanosis or edema Neurologic: Alert and oriented X 3, normal strength and tone. Normal symmetric reflexes. Normal coordination and gait  ECOG PERFORMANCE STATUS: 0 - Asymptomatic  Blood  pressure 95/79, pulse 66, temperature 98.5 F (36.9 C), temperature source Oral, resp. rate 18, height 5' 4"  (1.626 m), weight 137 lb 8 oz (62.37 kg), SpO2 100 %.  LABORATORY DATA: Lab Results  Component Value Date   WBC 6.7 02/24/2015   HGB 12.2 02/24/2015   HCT 37.3 02/24/2015   MCV 98.2 02/24/2015   PLT 292 02/24/2015      Chemistry      Component Value Date/Time   NA 140 02/24/2015 0838   NA 139 07/12/2014 0512   K 4.0 02/24/2015 0838   K 3.9 07/12/2014 0512   CL 100 07/12/2014 0512   CO2 24 02/24/2015 0838   CO2 32 07/12/2014 0512   BUN 11.8 02/24/2015 0838   BUN 5* 07/12/2014 0512   CREATININE 0.7 02/24/2015 0838   CREATININE 0.55 07/12/2014 0512      Component Value Date/Time   CALCIUM 9.2 02/24/2015 0838   CALCIUM 9.0 07/12/2014 0512   ALKPHOS 94 02/24/2015 0838   ALKPHOS 91 07/10/2014 0955   AST 12 02/24/2015 0838   AST 15 07/10/2014 0955   ALT 10 02/24/2015 0838   ALT 8 07/10/2014 0955   BILITOT 0.46 02/24/2015 0838   BILITOT 0.4 07/10/2014 0955       RADIOGRAPHIC STUDIES: Ct Chest W Contrast  02/20/2015   CLINICAL DATA:  Restaging for metastatic non-small cell right lung carcinoma. Currently undergoing chemotherapy.  EXAM: CT  CHEST, ABDOMEN, AND PELVIS WITH CONTRAST  TECHNIQUE: Multidetector CT imaging of the chest, abdomen and pelvis was performed following the standard protocol during bolus administration of intravenous contrast.  CONTRAST:  182mL OMNIPAQUE IOHEXOL 300 MG/ML  SOLN  COMPARISON:  12/19/2014  FINDINGS: CT CHEST FINDINGS  Mediastinum/Lymph Nodes: 9 mm right hilar lymph node remains stable. No pathologically enlarged lymph nodes identified.  Lungs/Pleura: Upper lobe emphysema and biapical scarring again noted. 2.4 by 1.5 cm mass in the posterior right lung apex remains stable.  Previously noted area of ground-glass opacity in the anterior left upper lobe on image 15/series 4 has decreased in size and now shows a more irregular appearance  suspicious for postinflammatory scarring. No new or enlarging pulmonary nodules or masses are identified.  A new focal area of airspace disease is seen in the superior segment of the right lower lobe on image 19 which is suspicious for pneumonia, radiation pneumonitis, or other inflammatory process, with neoplasm considered less likely. No evidence of pleural effusion.  Musculoskeletal/Soft Tissues: No suspicious bone lesions or other significant chest wall abnormality.  CT ABDOMEN AND PELVIS FINDINGS  Hepatobiliary: No masses or other significant abnormality identified.  Pancreas: No mass, inflammatory changes, or other significant abnormality identified.  Spleen:  Within normal limits in size and appearance.  Adrenals:  No masses identified.  Kidneys/Urinary Tract:  No evidence of masses or hydronephrosis.  Stomach/Bowel/Peritoneum: No evidence of wall thickening, mass, or obstruction.  Vascular/Lymphatic: No pathologically enlarged lymph nodes identified. No abdominal aortic aneurysm or other significant retroperitoneal abnormality demonstrated.  Reproductive: Prior hysterectomy noted. Adnexal regions are unremarkable in appearance.  Other:  None.  Musculoskeletal:  No suspicious bone lesions identified.  IMPRESSION: Stable 2.4 cm posterior right upper lobe nodule, consistent with known bronchogenic carcinoma.  Stable 9 mm right hilar lymph node. No new or increased lymphadenopathy identified.  Decreased size of ground-glass opacity in right upper lobe, consistent with resolving inflammatory process.  New focal airspace disease is seen in the superior segment of the right lower lobe, likely infectious or inflammatory in etiology, with neoplasm considered less likely. Recommend continued attention on follow-up CT.  No evidence of abdominal or pelvic metastatic disease.  Emphysema.   Electronically Signed   By: Earle Gell M.D.   On: 02/20/2015 10:59   Ct Abdomen Pelvis W Contrast  02/20/2015   CLINICAL DATA:   Restaging for metastatic non-small cell right lung carcinoma. Currently undergoing chemotherapy.  EXAM: CT CHEST, ABDOMEN, AND PELVIS WITH CONTRAST  TECHNIQUE: Multidetector CT imaging of the chest, abdomen and pelvis was performed following the standard protocol during bolus administration of intravenous contrast.  CONTRAST:  14mL OMNIPAQUE IOHEXOL 300 MG/ML  SOLN  COMPARISON:  12/19/2014  FINDINGS: CT CHEST FINDINGS  Mediastinum/Lymph Nodes: 9 mm right hilar lymph node remains stable. No pathologically enlarged lymph nodes identified.  Lungs/Pleura: Upper lobe emphysema and biapical scarring again noted. 2.4 by 1.5 cm mass in the posterior right lung apex remains stable.  Previously noted area of ground-glass opacity in the anterior left upper lobe on image 15/series 4 has decreased in size and now shows a more irregular appearance suspicious for postinflammatory scarring. No new or enlarging pulmonary nodules or masses are identified.  A new focal area of airspace disease is seen in the superior segment of the right lower lobe on image 19 which is suspicious for pneumonia, radiation pneumonitis, or other inflammatory process, with neoplasm considered less likely. No evidence of pleural effusion.  Musculoskeletal/Soft Tissues: No  suspicious bone lesions or other significant chest wall abnormality.  CT ABDOMEN AND PELVIS FINDINGS  Hepatobiliary: No masses or other significant abnormality identified.  Pancreas: No mass, inflammatory changes, or other significant abnormality identified.  Spleen:  Within normal limits in size and appearance.  Adrenals:  No masses identified.  Kidneys/Urinary Tract:  No evidence of masses or hydronephrosis.  Stomach/Bowel/Peritoneum: No evidence of wall thickening, mass, or obstruction.  Vascular/Lymphatic: No pathologically enlarged lymph nodes identified. No abdominal aortic aneurysm or other significant retroperitoneal abnormality demonstrated.  Reproductive: Prior hysterectomy  noted. Adnexal regions are unremarkable in appearance.  Other:  None.  Musculoskeletal:  No suspicious bone lesions identified.  IMPRESSION: Stable 2.4 cm posterior right upper lobe nodule, consistent with known bronchogenic carcinoma.  Stable 9 mm right hilar lymph node. No new or increased lymphadenopathy identified.  Decreased size of ground-glass opacity in right upper lobe, consistent with resolving inflammatory process.  New focal airspace disease is seen in the superior segment of the right lower lobe, likely infectious or inflammatory in etiology, with neoplasm considered less likely. Recommend continued attention on follow-up CT.  No evidence of abdominal or pelvic metastatic disease.  Emphysema.   Electronically Signed   By: Earle Gell M.D.   On: 02/20/2015 10:59    ASSESSMENT AND PLAN: This is a very pleasant 64 years old white female recently diagnosed with a stage IV non-small cell lung cancer, adenocarcinoma with negative EGFR mutation and negative ALK gene translocation. The patient is status post stereotactic radiotherapy to a solitary brain metastases in addition to palliative radiotherapy to the right upper lobe lung mass. She status post 6 cycles of systemic chemotherapy with carboplatin and Alimta with improvement in her disease and she is currently undergoing maintenance chemotherapy with single agent Alimta status post 9 cycles and tolerating her treatment fairly well. The recent CT scan of the chest, abdomen and pelvis showed stable disease. I discussed the scan results with the patient and her husband. I recommended for her to continue her current maintenance treatment with single agent Alimta 500 mg/M2 every 3 weeks. She will proceed with cycle #10 today as scheduled. For history of depression, the patient on Remeron 30 mg by mouth each bedtime. For anxiety, she is currently on Xanax 0.25 mg by mouth 3 times a day as needed.  She will come back for follow-up visit in 3 weeks for  reevaluation before proceeding with cycle #11. The patient was advised to call immediately if she has any concerning symptoms in the interval. The patient voices understanding of current disease status and treatment options and is in agreement with the current care plan.  All questions were answered. The patient knows to call the clinic with any problems, questions or concerns. We can certainly see the patient much sooner if necessary.  Disclaimer: This note was dictated with voice recognition software. Similar sounding words can inadvertently be transcribed and may not be corrected upon review.

## 2015-02-24 NOTE — Patient Instructions (Signed)
[facility] Discharge Instructions for Patients Receiving Chemotherapy  Today you received the following chemotherapy agents Almita  To help prevent nausea and vomiting after your treatment, we encourage you to take your nausea medication Compazine 10 mg every 6 hours as needed  If you develop nausea and vomiting that is not controlled by your nausea medication, call the clinic.   BELOW ARE SYMPTOMS THAT SHOULD BE REPORTED IMMEDIATELY:  *FEVER GREATER THAN 100.5 F  *CHILLS WITH OR WITHOUT FEVER  NAUSEA AND VOMITING THAT IS NOT CONTROLLED WITH YOUR NAUSEA MEDICATION  *UNUSUAL SHORTNESS OF BREATH  *UNUSUAL BRUISING OR BLEEDING  TENDERNESS IN MOUTH AND THROAT WITH OR WITHOUT PRESENCE OF ULCERS  *URINARY PROBLEMS  *BOWEL PROBLEMS  UNUSUAL RASH Items with * indicate a potential emergency and should be followed up as soon as possible.  Feel free to call the clinic you have any questions or concerns. The clinic phone number is [phone].  Please show the [facility] at check-in to the Emergency Department and triage nurse.

## 2015-03-16 ENCOUNTER — Telehealth: Payer: Self-pay | Admitting: Internal Medicine

## 2015-03-16 ENCOUNTER — Ambulatory Visit (HOSPITAL_BASED_OUTPATIENT_CLINIC_OR_DEPARTMENT_OTHER): Payer: BLUE CROSS/BLUE SHIELD | Admitting: Physician Assistant

## 2015-03-16 ENCOUNTER — Encounter: Payer: Self-pay | Admitting: Physician Assistant

## 2015-03-16 ENCOUNTER — Ambulatory Visit (HOSPITAL_BASED_OUTPATIENT_CLINIC_OR_DEPARTMENT_OTHER): Payer: BLUE CROSS/BLUE SHIELD

## 2015-03-16 ENCOUNTER — Other Ambulatory Visit (HOSPITAL_BASED_OUTPATIENT_CLINIC_OR_DEPARTMENT_OTHER): Payer: BLUE CROSS/BLUE SHIELD

## 2015-03-16 VITALS — BP 100/60 | HR 76 | Temp 98.4°F | Resp 18 | Ht 64.0 in | Wt 139.3 lb

## 2015-03-16 DIAGNOSIS — C3411 Malignant neoplasm of upper lobe, right bronchus or lung: Secondary | ICD-10-CM

## 2015-03-16 DIAGNOSIS — Z5111 Encounter for antineoplastic chemotherapy: Secondary | ICD-10-CM | POA: Diagnosis not present

## 2015-03-16 DIAGNOSIS — C7931 Secondary malignant neoplasm of brain: Secondary | ICD-10-CM | POA: Diagnosis not present

## 2015-03-16 DIAGNOSIS — C3410 Malignant neoplasm of upper lobe, unspecified bronchus or lung: Secondary | ICD-10-CM

## 2015-03-16 DIAGNOSIS — C7949 Secondary malignant neoplasm of other parts of nervous system: Principal | ICD-10-CM

## 2015-03-16 LAB — CBC WITH DIFFERENTIAL/PLATELET
BASO%: 0.2 % (ref 0.0–2.0)
Basophils Absolute: 0 10*3/uL (ref 0.0–0.1)
EOS%: 0 % (ref 0.0–7.0)
Eosinophils Absolute: 0 10*3/uL (ref 0.0–0.5)
HCT: 37.1 % (ref 34.8–46.6)
HGB: 12.3 g/dL (ref 11.6–15.9)
LYMPH%: 4.4 % — ABNORMAL LOW (ref 14.0–49.7)
MCH: 31.8 pg (ref 25.1–34.0)
MCHC: 33.1 g/dL (ref 31.5–36.0)
MCV: 96.2 fL (ref 79.5–101.0)
MONO#: 0.2 10*3/uL (ref 0.1–0.9)
MONO%: 3.5 % (ref 0.0–14.0)
NEUT#: 5.4 10*3/uL (ref 1.5–6.5)
NEUT%: 91.9 % — ABNORMAL HIGH (ref 38.4–76.8)
Platelets: 263 10*3/uL (ref 145–400)
RBC: 3.86 10*6/uL (ref 3.70–5.45)
RDW: 16 % — ABNORMAL HIGH (ref 11.2–14.5)
WBC: 5.9 10*3/uL (ref 3.9–10.3)
lymph#: 0.3 10*3/uL — ABNORMAL LOW (ref 0.9–3.3)

## 2015-03-16 LAB — COMPREHENSIVE METABOLIC PANEL (CC13)
ALT: 8 U/L (ref 0–55)
AST: 10 U/L (ref 5–34)
Albumin: 3.7 g/dL (ref 3.5–5.0)
Alkaline Phosphatase: 91 U/L (ref 40–150)
Anion Gap: 10 mEq/L (ref 3–11)
BUN: 13.8 mg/dL (ref 7.0–26.0)
CO2: 25 mEq/L (ref 22–29)
Calcium: 9.4 mg/dL (ref 8.4–10.4)
Chloride: 106 mEq/L (ref 98–109)
Creatinine: 0.8 mg/dL (ref 0.6–1.1)
EGFR: 85 mL/min/{1.73_m2} — ABNORMAL LOW (ref >90)
Glucose: 139 mg/dl (ref 70–140)
Potassium: 3.9 mEq/L (ref 3.5–5.1)
Sodium: 141 mEq/L (ref 136–145)
Total Bilirubin: 0.32 mg/dL (ref 0.20–1.20)
Total Protein: 7 g/dL (ref 6.4–8.3)

## 2015-03-16 MED ORDER — DEXAMETHASONE SODIUM PHOSPHATE 100 MG/10ML IJ SOLN
Freq: Once | INTRAMUSCULAR | Status: AC
  Administered 2015-03-16: 12:00:00 via INTRAVENOUS
  Filled 2015-03-16: qty 4

## 2015-03-16 MED ORDER — SODIUM CHLORIDE 0.9 % IV SOLN
Freq: Once | INTRAVENOUS | Status: AC
  Administered 2015-03-16: 11:00:00 via INTRAVENOUS

## 2015-03-16 MED ORDER — SODIUM CHLORIDE 0.9 % IV SOLN
500.0000 mg/m2 | Freq: Once | INTRAVENOUS | Status: AC
  Administered 2015-03-16: 800 mg via INTRAVENOUS
  Filled 2015-03-16: qty 32

## 2015-03-16 MED ORDER — FOLIC ACID 1 MG PO TABS: 1.0000 mg | ORAL_TABLET | Freq: Every day | ORAL | Status: DC

## 2015-03-16 NOTE — Progress Notes (Addendum)
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  PRIOR THERAPY:  1) Status post stereotactic radiotherapy to a solitary right parietal brain lesion under the care of Dr. [name] on 10/16/2013. 2) Status post palliative radiotherapy to the right lung tumor under the care of Dr. [name] completed on 12/05/2013. 3) Systemic chemotherapy with carboplatin for AUC of 5 and Alimta 500 mg/M2 every 3 weeks. First dose Jan 06 2014. Status post 6 cycles.  CURRENT THERAPY: Systemic chemotherapy with maintenance Alimta 500 mg/M2 every 3 weeks. Status post 10 cycles.  INTERVAL HISTORY: Alexis [name] 64 y.o. female returns to the clinic today for followup visit accompanied by her husband. The patient is feeling fine today. She is tolerating her maintenance chemotherapy with Alimta fairly well. She requests a refill for her folic acid. She denied having any significant chest pain, shortness of breath, cough or hemoptysis. She has no fever or chills, no nausea or vomiting. She denied having any significant weight loss or night sweats. She does report some discomfort in the lower section of her abdominal incision from her previous surgey to reverse her colostomy.   MEDICAL HISTORY: Past Medical History  Diagnosis Date  . Ileus, postoperative 11/18/2013  . Physical deconditioning 11/18/2013  . Severe protein-calorie malnutrition 11/18/2013  . Anemia 11/18/2013  . History of radiation therapy 10-28-13- 12-05-13    lung ca 50 Gy/110f  .  Anxiety   . Hx of radiation therapy 10/16/13    rt parietal brain/20Gy  . 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  MEDICATIONS:  Current Outpatient Prescriptions  Medication Sig Dispense Refill  . acetaminophen (TYLENOL) 500 MG tablet Take 500 mg by mouth every 6 (six) hours as needed for mild pain or headache.    . ALPRAZolam (XANAX) 0.25 MG tablet Take 1 tablet (0.25 mg total) by mouth 3 (three) times daily as needed for anxiety. 45 tablet 0  . Alum & Mag Hydroxide-Simeth (MAGIC MOUTHWASH) SOLN Take 5 mLs by mouth 4 (four) times daily as needed for mouth pain. 160 mL 0  . Ascorbic Acid (VITAMIN C GUMMIE PO) Take 1 each by mouth every morning.    .Marland Kitchendexamethasone (DECADRON) 4 MG tablet 4 mg by mouth twice a day the day before, day of and day after the chemotherapy every 3 weeks 40 tablet 1  . folic acid (FOLVITE) 1 MG tablet Take 1 tablet (1 mg total) by mouth daily. 30 tablet 2  . Multiple Vitamin (MULTIVITAMIN WITH MINERALS) TABS tablet Take 1 tablet by mouth every morning.    .Marland KitchenOVER THE COUNTER MEDICATION Take 1 tablet by mouth every morning. (Vitamin A)    . pentoxifylline (TRENTAL) 400 MG CR tablet Take 1 tablet (400 mg total) by mouth 2 (two) times daily. Patient is also to take vitamin E 400 mg BID with trental. 60 tablet 3  . Zinc 50 MG TABS Take 1 tablet by  mouth every morning.    . ondansetron (ZOFRAN) 8 MG tablet Take 1 tablet (8 mg total) by mouth every 8 (eight) hours as needed for nausea or vomiting. (Patient not taking: Reported on 03/16/2015) 30 tablet 0  . prochlorperazine (COMPAZINE) 10 MG tablet Take 1 tablet (10 mg total) by mouth every 6 (six) hours as needed for nausea or vomiting. (Patient not taking: Reported on 02/24/2015) 60 tablet 0   No current facility-administered medications for this visit.   Facility-Administered Medications Ordered in Other Visits  Medication Dose Route  Frequency Provider Last Rate Last Dose  . 0.9 %  sodium chloride infusion   Intravenous Once Curt Bears, MD      . ondansetron [facility]) 8 mg, dexamethasone (DECADRON) 10 mg in sodium chloride 0.9 % 50 mL IVPB   Intravenous Once Curt Bears, MD      . PEMEtrexed (ALIMTA) 800 mg in sodium chloride 0.9 % 100 mL chemo infusion  500 mg/m2 (Treatment Plan Actual) Intravenous Once Curt Bears, MD        SURGICAL HISTORY:  Past Surgical History  Procedure Laterality Date  . Video bronchoscopy Bilateral 08/30/2013    Procedure: VIDEO BRONCHOSCOPY WITH FLUORO;  Surgeon: Tanda Rockers, MD;  Location: [facility];  Service: Cardiopulmonary;  Laterality: Bilateral;  . Abdominal hysterectomy    . Laparotomy N/A 11/03/2013    Procedure: EXPLORATORY LAPAROTOMY, DRAINAGE OF INTRA  ABDOMINAL ABSCESSES, MOBILIZATION OF SPLENIC FLEXURE, SIGMOID COLECTOMY WITH COLOSTOMY;  Surgeon: Odis Hollingshead, MD;  Location: [facility];  Service: General;  Laterality: N/A;  . Colostomy takedown N/A 07/10/2014    Procedure: LAPAROSCOPIC LYSIS OF ADHESIONS (90 MIN) LAPAROSCOPIC ASSISTED COLOSTOMY CLOSURE, RIGID PROCTOSIGMOIDOSCOPY;  Surgeon: Jackolyn Confer, MD;  Location: [facility];  Service: General;  Laterality: N/A;    REVIEW OF SYSTEMS:  Constitutional: negative Eyes: negative Ears, nose, mouth, throat, and face: negative Respiratory: negative Cardiovascular: negative Gastrointestinal: positive for Discomfort/tightness in the lower portion of her abdominal incision from previous colon surgery and reversal of colostomy Genitourinary:negative Integument/breast: negative Hematologic/lymphatic: negative Musculoskeletal:negative Neurological: negative Behavioral/Psych: negative Endocrine: negative Allergic/Immunologic: negative   PHYSICAL EXAMINATION: General appearance: alert, cooperative, fatigued and no distress Head: Normocephalic, without obvious abnormality, atraumatic Neck: no adenopathy, no JVD, supple,  symmetrical, trachea midline and thyroid not enlarged, symmetric, no tenderness/mass/nodules Lymph nodes: Cervical, supraclavicular, and axillary nodes normal. Resp: clear to auscultation bilaterally Back: symmetric, no curvature. ROM normal. No CVA tenderness. Cardio: regular rate and rhythm, S1, S2 normal, no murmur, click, rub or gallop GI: soft, non-tender; bowel sounds normal; no masses,  no organomegaly Extremities: extremities normal, atraumatic, no cyanosis or edema Neurologic: Alert and oriented X 3, normal strength and tone. Normal symmetric reflexes. Normal coordination and gait Skin: Well-healed midline surgical incision as well as left mid quadrant incision from her previous colon surgery and reversal of colostomy  ECOG PERFORMANCE STATUS: 0 - Asymptomatic  Blood pressure 100/60, pulse 76, temperature 98.4 F (36.9 C), temperature source Oral, resp. rate 18, height 5' 4"  (1.626 m), weight 139 lb 4.8 oz (63.186 kg), SpO2 99 %.  LABORATORY DATA: Lab Results  Component Value Date   WBC 5.9 03/16/2015   HGB 12.3 03/16/2015   HCT 37.1 03/16/2015   MCV 96.2 03/16/2015   PLT 263 03/16/2015      Chemistry      Component Value Date/Time   NA 141 03/16/2015 0925   NA 139 07/12/2014 0512   K 3.9 03/16/2015 0925   K 3.9 07/12/2014  0512   CL 100 07/12/2014 0512   CO2 25 03/16/2015 0925   CO2 32 07/12/2014 0512   BUN 13.8 03/16/2015 0925   BUN 5* 07/12/2014 0512   CREATININE 0.8 03/16/2015 0925   CREATININE 0.55 07/12/2014 0512      Component Value Date/Time   CALCIUM 9.4 03/16/2015 0925   CALCIUM 9.0 07/12/2014 0512   ALKPHOS 91 03/16/2015 0925   ALKPHOS 91 07/10/2014 0955   AST 10 03/16/2015 0925   AST 15 07/10/2014 0955   ALT 8 03/16/2015 0925   ALT 8 07/10/2014 0955   BILITOT 0.32 03/16/2015 0925   BILITOT 0.4 07/10/2014 0955       RADIOGRAPHIC STUDIES: Ct Chest W Contrast  02/20/2015   CLINICAL DATA:  Restaging for metastatic non-small cell right lung  carcinoma. Currently undergoing chemotherapy.  EXAM: CT CHEST, ABDOMEN, AND PELVIS WITH CONTRAST  TECHNIQUE: Multidetector CT imaging of the chest, abdomen and pelvis was performed following the standard protocol during bolus administration of intravenous contrast.  CONTRAST:  147m OMNIPAQUE IOHEXOL 300 MG/ML  SOLN  COMPARISON:  12/19/2014  FINDINGS: CT CHEST FINDINGS  Mediastinum/Lymph Nodes: 9 mm right hilar lymph node remains stable. No pathologically enlarged lymph nodes identified.  Lungs/Pleura: Upper lobe emphysema and biapical scarring again noted. 2.4 by 1.5 cm mass in the posterior right lung apex remains stable.  Previously noted area of ground-glass opacity in the anterior left upper lobe on image 15/series 4 has decreased in size and now shows a more irregular appearance suspicious for postinflammatory scarring. No new or enlarging pulmonary nodules or masses are identified.  A new focal area of airspace disease is seen in the superior segment of the right lower lobe on image 19 which is suspicious for pneumonia, radiation pneumonitis, or other inflammatory process, with neoplasm considered less likely. No evidence of pleural effusion.  Musculoskeletal/Soft Tissues: No suspicious bone lesions or other significant chest wall abnormality.  CT ABDOMEN AND PELVIS FINDINGS  Hepatobiliary: No masses or other significant abnormality identified.  Pancreas: No mass, inflammatory changes, or other significant abnormality identified.  Spleen:  Within normal limits in size and appearance.  Adrenals:  No masses identified.  Kidneys/Urinary Tract:  No evidence of masses or hydronephrosis.  Stomach/Bowel/Peritoneum: No evidence of wall thickening, mass, or obstruction.  Vascular/Lymphatic: No pathologically enlarged lymph nodes identified. No abdominal aortic aneurysm or other significant retroperitoneal abnormality demonstrated.  Reproductive: Prior hysterectomy noted. Adnexal regions are unremarkable in appearance.   Other:  None.  Musculoskeletal:  No suspicious bone lesions identified.  IMPRESSION: Stable 2.4 cm posterior right upper lobe nodule, consistent with known bronchogenic carcinoma.  Stable 9 mm right hilar lymph node. No new or increased lymphadenopathy identified.  Decreased size of ground-glass opacity in right upper lobe, consistent with resolving inflammatory process.  New focal airspace disease is seen in the superior segment of the right lower lobe, likely infectious or inflammatory in etiology, with neoplasm considered less likely. Recommend continued attention on follow-up CT.  No evidence of abdominal or pelvic metastatic disease.  Emphysema.   Electronically Signed   By: JEarle GellM.D.   On: 02/20/2015 10:59   Ct Abdomen Pelvis W Contrast  02/20/2015   CLINICAL DATA:  Restaging for metastatic non-small cell right lung carcinoma. Currently undergoing chemotherapy.  EXAM: CT CHEST, ABDOMEN, AND PELVIS WITH CONTRAST  TECHNIQUE: Multidetector CT imaging of the chest, abdomen and pelvis was performed following the standard protocol during bolus administration of intravenous contrast.  CONTRAST:  106m  OMNIPAQUE IOHEXOL 300 MG/ML  SOLN  COMPARISON:  12/19/2014  FINDINGS: CT CHEST FINDINGS  Mediastinum/Lymph Nodes: 9 mm right hilar lymph node remains stable. No pathologically enlarged lymph nodes identified.  Lungs/Pleura: Upper lobe emphysema and biapical scarring again noted. 2.4 by 1.5 cm mass in the posterior right lung apex remains stable.  Previously noted area of ground-glass opacity in the anterior left upper lobe on image 15/series 4 has decreased in size and now shows a more irregular appearance suspicious for postinflammatory scarring. No new or enlarging pulmonary nodules or masses are identified.  A new focal area of airspace disease is seen in the superior segment of the right lower lobe on image 19 which is suspicious for pneumonia, radiation pneumonitis, or other inflammatory process, with  neoplasm considered less likely. No evidence of pleural effusion.  Musculoskeletal/Soft Tissues: No suspicious bone lesions or other significant chest wall abnormality.  CT ABDOMEN AND PELVIS FINDINGS  Hepatobiliary: No masses or other significant abnormality identified.  Pancreas: No mass, inflammatory changes, or other significant abnormality identified.  Spleen:  Within normal limits in size and appearance.  Adrenals:  No masses identified.  Kidneys/Urinary Tract:  No evidence of masses or hydronephrosis.  Stomach/Bowel/Peritoneum: No evidence of wall thickening, mass, or obstruction.  Vascular/Lymphatic: No pathologically enlarged lymph nodes identified. No abdominal aortic aneurysm or other significant retroperitoneal abnormality demonstrated.  Reproductive: Prior hysterectomy noted. Adnexal regions are unremarkable in appearance.  Other:  None.  Musculoskeletal:  No suspicious bone lesions identified.  IMPRESSION: Stable 2.4 cm posterior right upper lobe nodule, consistent with known bronchogenic carcinoma.  Stable 9 mm right hilar lymph node. No new or increased lymphadenopathy identified.  Decreased size of ground-glass opacity in right upper lobe, consistent with resolving inflammatory process.  New focal airspace disease is seen in the superior segment of the right lower lobe, likely infectious or inflammatory in etiology, with neoplasm considered less likely. Recommend continued attention on follow-up CT.  No evidence of abdominal or pelvic metastatic disease.  Emphysema.   Electronically Signed   By: Earle Gell M.D.   On: 02/20/2015 10:59    ASSESSMENT AND PLAN: This is a very pleasant 64 years old white female recently diagnosed with a stage IV non-small cell lung cancer, adenocarcinoma with negative EGFR mutation and negative ALK gene translocation. The patient is status post stereotactic radiotherapy to a solitary brain metastases in addition to palliative radiotherapy to the right upper lobe lung  mass. She status post 6 cycles of systemic chemotherapy with carboplatin and Alimta with improvement in her disease and she is currently undergoing maintenance chemotherapy with single agent Alimta status post 9 cycles and tolerating her treatment fairly well. The recent CT scan of the chest, abdomen and pelvis showed stable disease. Patient was discussed with and also seen by Dr. Julien Nordmann. Recommended for her to continue her current maintenance treatment with single agent Alimta 500 mg/M2 every 3 weeks. She will proceed with cycle #11 today as scheduled. For history of depression, the patient on Remeron 30 mg by mouth each bedtime. For anxiety, she is currently on Xanax 0.25 mg by mouth 3 times a day as needed.  She will come back for follow-up visit in 3 weeks for reevaluation before proceeding with cycle #12. If her incisional pain persists or worsens, the patient is advised to follow-up with Dr. Zella Richer. The patient was advised to call immediately if she has any concerning symptoms in the interval. The patient voices understanding of current disease status and  treatment options and is in agreement with the current care plan.  All questions were answered. The patient knows to call the clinic with any problems, questions or concerns. We can certainly see the patient much sooner if necessary.  Carlton Adam, PA-C 03/16/2015  ADDENDUM: Hematology/Oncology Attending: I had a face to face encounter with the patient today. I recommended her care plan. This is a very pleasant 64 years old white female with metastatic non-small cell lung cancer status post induction chemotherapy with carboplatin and Alimta and currently undergoing maintenance treatment with single agent Alimta. She status post 10 cycles and tolerating her treatment fairly well. She has some mild abdominal pain at the incision side of her previous colostomy. I recommended for her to consult with Dr. Zella Richer if it continues to be  an issue. I recommended for the patient to proceed with cycle #11 today as a scheduled. She would come back for follow-up visit in 3 weeks for reevaluation before the next cycle of her treatment. The patient was advised to call immediately if she has any concerning symptoms in the interval.  Disclaimer: This note was dictated with voice recognition software. Similar sounding words can inadvertently be transcribed and may be missed upon review. Eilleen Kempf., MD 03/16/2015

## 2015-03-16 NOTE — Patient Instructions (Signed)
[facility] Discharge Instructions for Patients Receiving Chemotherapy  Today you received the following chemotherapy agents Alimta  To help prevent nausea and vomiting after your treatment, we encourage you to take your nausea medication   If you develop nausea and vomiting that is not controlled by your nausea medication, call the clinic.   BELOW ARE SYMPTOMS THAT SHOULD BE REPORTED IMMEDIATELY:  *FEVER GREATER THAN 100.5 F  *CHILLS WITH OR WITHOUT FEVER  NAUSEA AND VOMITING THAT IS NOT CONTROLLED WITH YOUR NAUSEA MEDICATION  *UNUSUAL SHORTNESS OF BREATH  *UNUSUAL BRUISING OR BLEEDING  TENDERNESS IN MOUTH AND THROAT WITH OR WITHOUT PRESENCE OF ULCERS  *URINARY PROBLEMS  *BOWEL PROBLEMS  UNUSUAL RASH Items with * indicate a potential emergency and should be followed up as soon as possible.  Feel free to call the clinic you have any questions or concerns. The clinic phone number is [phone].  Please show the CHEMO ALERT CARD at check-in to the Emergency Department and triage nurse.   

## 2015-03-16 NOTE — Patient Instructions (Signed)
Continue labs and chemotherapy as scheduled Follow-up in 3 weeks prior to next scheduled cycle of maintenance chemotherapy

## 2015-03-16 NOTE — Telephone Encounter (Signed)
gave and printed appt sched and avs for pt for Aug

## 2015-04-06 ENCOUNTER — Telehealth: Payer: Self-pay | Admitting: Internal Medicine

## 2015-04-06 ENCOUNTER — Encounter: Payer: Self-pay | Admitting: Internal Medicine

## 2015-04-06 ENCOUNTER — Other Ambulatory Visit (HOSPITAL_BASED_OUTPATIENT_CLINIC_OR_DEPARTMENT_OTHER): Payer: BLUE CROSS/BLUE SHIELD

## 2015-04-06 ENCOUNTER — Other Ambulatory Visit: Payer: Self-pay | Admitting: Medical Oncology

## 2015-04-06 ENCOUNTER — Other Ambulatory Visit: Payer: Self-pay | Admitting: Internal Medicine

## 2015-04-06 ENCOUNTER — Ambulatory Visit (HOSPITAL_BASED_OUTPATIENT_CLINIC_OR_DEPARTMENT_OTHER): Payer: BLUE CROSS/BLUE SHIELD | Admitting: Internal Medicine

## 2015-04-06 ENCOUNTER — Ambulatory Visit (HOSPITAL_BASED_OUTPATIENT_CLINIC_OR_DEPARTMENT_OTHER): Payer: BLUE CROSS/BLUE SHIELD

## 2015-04-06 VITALS — BP 95/77 | HR 64 | Temp 98.6°F | Resp 18 | Ht 64.0 in | Wt 141.3 lb

## 2015-04-06 DIAGNOSIS — C3411 Malignant neoplasm of upper lobe, right bronchus or lung: Secondary | ICD-10-CM

## 2015-04-06 DIAGNOSIS — F419 Anxiety disorder, unspecified: Secondary | ICD-10-CM

## 2015-04-06 DIAGNOSIS — C3410 Malignant neoplasm of upper lobe, unspecified bronchus or lung: Secondary | ICD-10-CM

## 2015-04-06 DIAGNOSIS — C7949 Secondary malignant neoplasm of other parts of nervous system: Secondary | ICD-10-CM

## 2015-04-06 DIAGNOSIS — C7931 Secondary malignant neoplasm of brain: Secondary | ICD-10-CM

## 2015-04-06 DIAGNOSIS — Z5111 Encounter for antineoplastic chemotherapy: Secondary | ICD-10-CM

## 2015-04-06 LAB — CBC WITH DIFFERENTIAL/PLATELET
BASO%: 0.3 % (ref 0.0–2.0)
Basophils Absolute: 0 10*3/uL (ref 0.0–0.1)
EOS%: 0.1 % (ref 0.0–7.0)
Eosinophils Absolute: 0 10*3/uL (ref 0.0–0.5)
HCT: 37.8 % (ref 34.8–46.6)
HGB: 12.5 g/dL (ref 11.6–15.9)
LYMPH%: 3.7 % — ABNORMAL LOW (ref 14.0–49.7)
MCH: 32.1 pg (ref 25.1–34.0)
MCHC: 33.2 g/dL (ref 31.5–36.0)
MCV: 96.9 fL (ref 79.5–101.0)
MONO#: 0.5 10*3/uL (ref 0.1–0.9)
MONO%: 5.1 % (ref 0.0–14.0)
NEUT#: 8.3 10*3/uL — ABNORMAL HIGH (ref 1.5–6.5)
NEUT%: 90.8 % — ABNORMAL HIGH (ref 38.4–76.8)
Platelets: 266 10*3/uL (ref 145–400)
RBC: 3.9 10*6/uL (ref 3.70–5.45)
RDW: 16 % — ABNORMAL HIGH (ref 11.2–14.5)
WBC: 9.1 10*3/uL (ref 3.9–10.3)
lymph#: 0.3 10*3/uL — ABNORMAL LOW (ref 0.9–3.3)

## 2015-04-06 LAB — COMPREHENSIVE METABOLIC PANEL (CC13)
ALT: 10 U/L (ref 0–55)
AST: 10 U/L (ref 5–34)
Albumin: 3.8 g/dL (ref 3.5–5.0)
Alkaline Phosphatase: 86 U/L (ref 40–150)
Anion Gap: 8 mEq/L (ref 3–11)
BUN: 16.4 mg/dL (ref 7.0–26.0)
CO2: 26 mEq/L (ref 22–29)
Calcium: 9.6 mg/dL (ref 8.4–10.4)
Chloride: 107 mEq/L (ref 98–109)
Creatinine: 0.8 mg/dL (ref 0.6–1.1)
EGFR: 81 mL/min/{1.73_m2} — ABNORMAL LOW (ref >90)
Glucose: 122 mg/dl (ref 70–140)
Potassium: 3.8 mEq/L (ref 3.5–5.1)
Sodium: 141 mEq/L (ref 136–145)
Total Bilirubin: 0.34 mg/dL (ref 0.20–1.20)
Total Protein: 7 g/dL (ref 6.4–8.3)

## 2015-04-06 MED ORDER — CYANOCOBALAMIN 1000 MCG/ML IJ SOLN
INTRAMUSCULAR | Status: AC
  Filled 2015-04-06: qty 1

## 2015-04-06 MED ORDER — CYANOCOBALAMIN 1000 MCG/ML IJ SOLN
1000.0000 ug | Freq: Once | INTRAMUSCULAR | Status: AC
  Administered 2015-04-06: 1000 ug via INTRAMUSCULAR

## 2015-04-06 MED ORDER — SODIUM CHLORIDE 0.9 % IV SOLN
Freq: Once | INTRAVENOUS | Status: AC
  Administered 2015-04-06: 10:00:00 via INTRAVENOUS

## 2015-04-06 MED ORDER — SODIUM CHLORIDE 0.9 % IV SOLN
Freq: Once | INTRAVENOUS | Status: AC
  Administered 2015-04-06: 11:00:00 via INTRAVENOUS
  Filled 2015-04-06: qty 4

## 2015-04-06 MED ORDER — SODIUM CHLORIDE 0.9 % IV SOLN
500.0000 mg/m2 | Freq: Once | INTRAVENOUS | Status: AC
  Administered 2015-04-06: 800 mg via INTRAVENOUS
  Filled 2015-04-06: qty 32

## 2015-04-06 MED ORDER — ALPRAZOLAM 0.25 MG PO TABS
0.2500 mg | ORAL_TABLET | Freq: Three times a day (TID) | ORAL | Status: DC | PRN
Start: 2015-04-06 — End: 2015-04-06

## 2015-04-06 NOTE — Telephone Encounter (Signed)
Pt confirmed labs/ov per 08/08 POF, gave pt AVS and Calendar..... KJ, sent msg to add chemo

## 2015-04-06 NOTE — Progress Notes (Signed)
[place] [phone]) [phone]   Fax:(336) [place], MD [address] [place] [address]  DIAGNOSIS: Stage IV (T2a, N0, M1b) non-small cell lung cancer consistent with adenocarcinoma with negative EGFR mutation and negative ALK gene translocation diagnosed in January of 2015 presented with right upper lobe lung mass in addition to a solitary brain metastasis status post stereotactic to a solitary brain metastasis as well as radiotherapy to the right upper lobe lung mass.  PRIOR THERAPY:  1) Status post stereotactic radiotherapy to a solitary right parietal brain lesion under the care of Dr. [name] on 10/16/2013. 2) Status post palliative radiotherapy to the right lung tumor under the care of Dr. [name] completed on 12/05/2013. 3) Systemic chemotherapy with carboplatin for AUC of 5 and Alimta 500 mg/M2 every 3 weeks. First dose Jan 06 2014. Status post 6 cycles.  CURRENT THERAPY: Systemic chemotherapy with maintenance Alimta 500 mg/M2 every 3 weeks. Status post 14 cycles.  INTERVAL HISTORY: Alexis [name] 64 y.o. female returns to the clinic today for followup visit accompanied by her husband. The patient is feeling fine today. She is tolerating her maintenance chemotherapy with Alimta fairly well. She denied having any significant chest pain, shortness of breath, cough or hemoptysis. She has no fever or chills, no nausea or vomiting. She denied having any significant weight loss or night sweats.    MEDICAL HISTORY: Past Medical History  Diagnosis Date  . Ileus, postoperative 11/18/2013  . Physical deconditioning 11/18/2013  . Severe protein-calorie malnutrition 11/18/2013  . Anemia 11/18/2013  . History of radiation therapy 10-28-13- 12-05-13    lung ca 50 Gy/38f  . Anxiety   . Hx of radiation therapy 10/16/13    rt parietal brain/20Gy  . 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  MEDICATIONS:  Current Outpatient Prescriptions  Medication Sig Dispense Refill  . ALPRAZolam (XANAX) 0.25 MG tablet Take 1 tablet (0.25 mg total) by mouth 3 (three) times daily as needed for anxiety. 45 tablet 0  . Ascorbic Acid (VITAMIN C GUMMIE PO) Take 1 each by mouth every morning.    .Marland Kitchendexamethasone (DECADRON) 4 MG tablet 4 mg by mouth twice a day the day before, day of and day after the chemotherapy every 3 weeks 40 tablet 1  . folic acid (FOLVITE) 1 MG tablet Take 1 tablet (1 mg total) by mouth daily. 30 tablet 2  . Multiple Vitamin (MULTIVITAMIN WITH MINERALS) TABS tablet Take 1 tablet by mouth every morning.    .Marland KitchenOVER THE COUNTER MEDICATION Take 1 tablet by mouth every morning. (Vitamin A)    . pentoxifylline (TRENTAL) 400 MG CR tablet Take 1 tablet (400 mg total) by mouth 2 (two) times daily. Patient is also to take vitamin E 400 mg BID with trental. 60 tablet 3  . Zinc 50 MG TABS Take 1 tablet by mouth every morning.    .Marland Kitchenacetaminophen (TYLENOL) 500 MG tablet Take 500 mg by mouth every 6 (six) hours as needed for mild pain or headache.    . Alum & Mag Hydroxide-Simeth (MAGIC MOUTHWASH) SOLN Take 5 mLs by mouth 4 (four) times daily as needed for mouth pain. (Patient not taking: Reported on 04/06/2015) 160 mL 0  . ondansetron (ZOFRAN) 8 MG tablet Take 1 tablet (8 mg total) by mouth every 8 (  eight) hours as needed for nausea or vomiting. (Patient not taking: Reported on 03/16/2015) 30 tablet 0  . prochlorperazine (COMPAZINE) 10 MG tablet Take 1 tablet (10 mg total) by mouth every 6 (six) hours as needed for nausea or vomiting. (Patient not taking: Reported on 02/24/2015) 60 tablet 0   No current facility-administered medications for this visit.    SURGICAL HISTORY:  Past Surgical History  Procedure Laterality Date  . Video bronchoscopy Bilateral 08/30/2013    Procedure: VIDEO BRONCHOSCOPY WITH FLUORO;  Surgeon: Tanda Rockers, MD;  Location: [facility]  ENDOSCOPY;  Service: Cardiopulmonary;  Laterality: Bilateral;  . Abdominal hysterectomy    . Laparotomy N/A 11/03/2013    Procedure: EXPLORATORY LAPAROTOMY, DRAINAGE OF INTRA  ABDOMINAL ABSCESSES, MOBILIZATION OF SPLENIC FLEXURE, SIGMOID COLECTOMY WITH COLOSTOMY;  Surgeon: Odis Hollingshead, MD;  Location: [facility] ORS;  Service: General;  Laterality: N/A;  . Colostomy takedown N/A 07/10/2014    Procedure: LAPAROSCOPIC LYSIS OF ADHESIONS (90 MIN) LAPAROSCOPIC ASSISTED COLOSTOMY CLOSURE, RIGID PROCTOSIGMOIDOSCOPY;  Surgeon: Jackolyn Confer, MD;  Location: [facility] ORS;  Service: General;  Laterality: N/A;    REVIEW OF SYSTEMS:  Constitutional: negative Eyes: negative Ears, nose, mouth, throat, and face: negative Respiratory: negative Cardiovascular: negative Gastrointestinal: negative Genitourinary:negative Integument/breast: negative Hematologic/lymphatic: negative Musculoskeletal:negative Neurological: negative Behavioral/Psych: negative Endocrine: negative Allergic/Immunologic: negative   PHYSICAL EXAMINATION: General appearance: alert, cooperative, fatigued and no distress Head: Normocephalic, without obvious abnormality, atraumatic Neck: no adenopathy, no JVD, supple, symmetrical, trachea midline and thyroid not enlarged, symmetric, no tenderness/mass/nodules Lymph nodes: Cervical, supraclavicular, and axillary nodes normal. Resp: clear to auscultation bilaterally Back: symmetric, no curvature. ROM normal. No CVA tenderness. Cardio: regular rate and rhythm, S1, S2 normal, no murmur, click, rub or gallop GI: soft, non-tender; bowel sounds normal; no masses,  no organomegaly Extremities: extremities normal, atraumatic, no cyanosis or edema Neurologic: Alert and oriented X 3, normal strength and tone. Normal symmetric reflexes. Normal coordination and gait  ECOG PERFORMANCE STATUS: 0 - Asymptomatic  Blood pressure 95/77, pulse 64, temperature 98.6 F (37 C), temperature source Oral, resp.  rate 18, height _0  (1.626 m), weight 141 lb 4.8 oz (64.093 kg), SpO2 100 %.  LABORATORY DATA: Lab Results  Component Value Date   WBC 9.1 04/06/2015   HGB 12.5 04/06/2015   HCT 37.8 04/06/2015   MCV 96.9 04/06/2015   PLT 266 04/06/2015      Chemistry      Component Value Date/Time   NA 141 04/06/2015 0857   NA 139 07/12/2014 0512   K 3.8 04/06/2015 0857   K 3.9 07/12/2014 0512   CL 100 07/12/2014 0512   CO2 26 04/06/2015 0857   CO2 32 07/12/2014 0512   BUN 16.4 04/06/2015 0857   BUN 5* 07/12/2014 0512   CREATININE 0.8 04/06/2015 0857   CREATININE 0.55 07/12/2014 0512      Component Value Date/Time   CALCIUM 9.6 04/06/2015 0857   CALCIUM 9.0 07/12/2014 0512   ALKPHOS 86 04/06/2015 0857   ALKPHOS 91 07/10/2014 0955   AST 10 04/06/2015 0857   AST 15 07/10/2014 0955   ALT 10 04/06/2015 0857   ALT 8 07/10/2014 0955   BILITOT 0.34 04/06/2015 0857   BILITOT 0.4 07/10/2014 0955       RADIOGRAPHIC STUDIES: No results found.  ASSESSMENT AND PLAN: This is a very pleasant 64 years old white female recently diagnosed with a stage IV non-small cell lung cancer, adenocarcinoma with negative EGFR mutation and negative ALK gene translocation. The  patient is status post stereotactic radiotherapy to a solitary brain metastases in addition to palliative radiotherapy to the right upper lobe lung mass. She status post 6 cycles of systemic chemotherapy with carboplatin and Alimta with improvement in her disease and she is currently undergoing maintenance chemotherapy with single agent Alimta status post 14 cycles and tolerating her treatment fairly well. I recommended for her to continue her current maintenance treatment with single agent Alimta 500 mg/M2 every 3 weeks. She will proceed with cycle #14 today as scheduled. For history of depression, the patient on Remeron 30 mg by mouth each bedtime. For anxiety, she is currently on Xanax 0.25 mg by mouth 3 times a day as needed and she  was given a refill of her medication.  She will come back for follow-up visit in 3 weeks for reevaluation before proceeding with cycle #15 after repeating CT scan of the chest, abdomen and pelvis for restaging of her disease. The patient was advised to call immediately if she has any concerning symptoms in the interval. The patient voices understanding of current disease status and treatment options and is in agreement with the current care plan.  All questions were answered. The patient knows to call the clinic with any problems, questions or concerns. We can certainly see the patient much sooner if necessary.  Disclaimer: This note was dictated with voice recognition software. Similar sounding words can inadvertently be transcribed and may not be corrected upon review.

## 2015-04-06 NOTE — Patient Instructions (Signed)
[facility] Discharge Instructions for Patients Receiving Chemotherapy  Today you received the following chemotherapy agents Alimta.  To help prevent nausea and vomiting after your treatment, we encourage you to take your nausea medication as prescribed.   If you develop nausea and vomiting that is not controlled by your nausea medication, call the clinic.   BELOW ARE SYMPTOMS THAT SHOULD BE REPORTED IMMEDIATELY:  *FEVER GREATER THAN 100.5 F  *CHILLS WITH OR WITHOUT FEVER  NAUSEA AND VOMITING THAT IS NOT CONTROLLED WITH YOUR NAUSEA MEDICATION  *UNUSUAL SHORTNESS OF BREATH  *UNUSUAL BRUISING OR BLEEDING  TENDERNESS IN MOUTH AND THROAT WITH OR WITHOUT PRESENCE OF ULCERS  *URINARY PROBLEMS  *BOWEL PROBLEMS  UNUSUAL RASH Items with * indicate a potential emergency and should be followed up as soon as possible.  Feel free to call the clinic you have any questions or concerns. The clinic phone number is [phone].  Please show the CHEMO ALERT CARD at check-in to the Emergency Department and triage nurse.   

## 2015-04-06 NOTE — Progress Notes (Signed)
Called in xanax refill

## 2015-04-06 NOTE — Telephone Encounter (Signed)
Pt confirmed labs/ov per 08/08 POF, gave pt AVS and Calendar..... KJ, gave pt barium

## 2015-04-09 ENCOUNTER — Ambulatory Visit
Admission: RE | Admit: 2015-04-09 | Discharge: 2015-04-09 | Disposition: A | Discharge status: Home / Self Care | Payer: BLUE CROSS/BLUE SHIELD | Source: Ambulatory Visit | Attending: Radiation Oncology | Admitting: Radiation Oncology

## 2015-04-09 DIAGNOSIS — C7949 Secondary malignant neoplasm of other parts of nervous system: Principal | ICD-10-CM

## 2015-04-09 DIAGNOSIS — C7931 Secondary malignant neoplasm of brain: Secondary | ICD-10-CM

## 2015-04-09 MED ORDER — GADOBENATE DIMEGLUMINE 529 MG/ML IV SOLN
13.0000 mL | Freq: Once | INTRAVENOUS | Status: AC | PRN
  Administered 2015-04-09: 13 mL via INTRAVENOUS

## 2015-04-13 ENCOUNTER — Telehealth: Payer: Self-pay | Admitting: *Deleted

## 2015-04-13 ENCOUNTER — Ambulatory Visit
Admission: RE | Admit: 2015-04-13 | Discharge: 2015-04-13 | Disposition: A | Discharge status: Home / Self Care | Payer: BLUE CROSS/BLUE SHIELD | Source: Ambulatory Visit | Attending: Radiation Oncology | Admitting: Radiation Oncology

## 2015-04-13 ENCOUNTER — Encounter: Payer: Self-pay | Admitting: Radiation Oncology

## 2015-04-13 VITALS — BP 121/57 | HR 98 | Temp 98.2°F | Resp 16 | Ht 64.0 in | Wt 142.9 lb

## 2015-04-13 DIAGNOSIS — C3411 Malignant neoplasm of upper lobe, right bronchus or lung: Secondary | ICD-10-CM

## 2015-04-13 NOTE — Telephone Encounter (Signed)
Called and spoke with spouse they thought patient's appt was at [place], they are 10-15 minutes form here, told them not to worry and will see them when they get here 8:36 AM

## 2015-04-13 NOTE — Progress Notes (Signed)
Radiation Oncology         [phone] ________________________________  Name: Alexis [name] MRN: [id number]  Date: 04/13/2015  DOB: 21-May-1951  Follow-Up Visit Note  CC: [name], MD  [name], *  Diagnosis:   Metastatic lung cancer with brain metastasis  Interval Since Last Radiation:  Radiosurgery to a right parietal target completed on 10/16/2013   Narrative:  The patient returns today for routine follow-up. SRS brain mets 10/16/13. MRI of the brain done on 04/09/15 and she is here for the results. No c/o pain, headaches, nausea, blurred vision or dizziness. She reports a good appetite. She still takes trental with vitamin E. She has a CT scheduled on 04/24/15 and and chemotherapy on 04/27/15.  ALLERGIES:  is allergic to codeine.  Meds: Current Outpatient Prescriptions  Medication Sig Dispense Refill  . acetaminophen (TYLENOL) 500 MG tablet Take 500 mg by mouth every 6 (six) hours as needed for mild pain or headache.    . ALPRAZolam (XANAX) 0.25 MG tablet TAKE ONE TABLET BY MOUTH THREE TIMES DAILY AS NEEDED FOR ANXIETY 45 tablet 0  . Ascorbic Acid (VITAMIN C GUMMIE PO) Take 1 each by mouth every morning.    Marland Kitchen dexamethasone (DECADRON) 4 MG tablet 4 mg by mouth twice a day the day before, day of and day after the chemotherapy every 3 weeks 40 tablet 1  . folic acid (FOLVITE) 1 MG tablet Take 1 tablet (1 mg total) by mouth daily. 30 tablet 2  . Multiple Vitamin (MULTIVITAMIN WITH MINERALS) TABS tablet Take 1 tablet by mouth every morning.    . pentoxifylline (TRENTAL) 400 MG CR tablet Take 1 tablet (400 mg total) by mouth 2 (two) times daily. Patient is also to take vitamin E 400 mg BID with trental. 60 tablet 3  . Zinc 50 MG TABS Take 1 tablet by mouth every morning.    . Alum & Mag Hydroxide-Simeth (MAGIC MOUTHWASH) SOLN Take 5 mLs by mouth 4 (four) times daily as needed for mouth pain. (Patient not taking: Reported on 04/06/2015) 160 mL 0  . ondansetron (ZOFRAN) 8 MG  tablet Take 1 tablet (8 mg total) by mouth every 8 (eight) hours as needed for nausea or vomiting. (Patient not taking: Reported on 03/16/2015) 30 tablet 0  . OVER THE COUNTER MEDICATION Take 1 tablet by mouth every morning. (Vitamin A)    . prochlorperazine (COMPAZINE) 10 MG tablet Take 1 tablet (10 mg total) by mouth every 6 (six) hours as needed for nausea or vomiting. (Patient not taking: Reported on 02/24/2015) 60 tablet 0   No current facility-administered medications for this encounter.    Physical Findings: The patient is in no acute distress. Patient is alert and oriented.  height is '5\' 4"'$  (1.626 m) and weight is 142 lb 14.4 oz (64.819 kg). Her oral temperature is 98.2 F (36.8 C). Her blood pressure is 121/57 and her pulse is 98. Her respiration is 16 and oxygen saturation is 100%.    Lab Findings: Lab Results  Component Value Date   WBC 9.1 04/06/2015   HGB 12.5 04/06/2015   HCT 37.8 04/06/2015   MCV 96.9 04/06/2015   PLT 266 04/06/2015     Radiographic Findings: Mr [name] NO Contrast  04/09/2015   CLINICAL DATA:  64 year old female Metastatic lung cancer with brain metastasis. Radiosurgery to a solitary right parietal target completed on 10/16/2013. Restaging. Subsequent encounter.  EXAM: MRI HEAD WITHOUT AND WITH CONTRAST  TECHNIQUE: Multiplanar, multiecho pulse  sequences of the brain and surrounding structures were obtained without and with intravenous contrast.  CONTRAST:  24m MULTIHANCE GADOBENATE DIMEGLUMINE 529 MG/ML IV SOLN  COMPARISON:  01/09/2015 and earlier.  FINDINGS: The residual rectangular, mildly lobulated focus of enhancement in the treated right parietal lobe has minimally increased in size since December 2015 (12-14 mm now versus 11-13 mm previously). Surrounding FLAIR hyperintensity however is stable since that time, and there is no associated mass effect. Hemosiderin deposition is increased (series 5, image 19 today versus series 6, image 19 previously). The  lesion is on series 10, image 112 today.  Wrap artifact in the right cerebellar hemisphere on series 10, image 39. No other abnormal intracranial enhancement. No intracranial mass effect or other mass lesion.  No restricted diffusion to suggest acute infarction. No ventriculomegaly, extra-axial collection or acute intracranial hemorrhage. Cervicomedullary junction and pituitary are within normal limits. Major intracranial vascular flow voids are stable. Outside of the right parietal lobe, stable gray and white matter signal with patchy nonspecific T2 and FLAIR hyperintensity.  Visualized cervical spine appears normal. Bone marrow signal is stable and within normal limits. Chronic left scalp sebaceous cyst incidentally noted. Visible internal auditory structures appear normal. Stable paranasal sinuses and mastoids. Orbits soft tissues appear normal.  IMPRESSION: 1. Minimal enlargement of the residual enhancing right parietal lobe lesion since December 2015, strongly favored to be due to treatment effect rather than disease progression. Hemosiderin deposition at the lesion has also increased. Surrounding FLAIR hyperintensity is unchanged. 2. No new brain metastasis or new intracranial abnormality.   Electronically Signed   By: HGenevie AnnM.D.   On: 04/09/2015 12:26    Impression: No sign of progression of the disease on the MRI scan. No new disease seen. Can continue standard operation for brain metastases.  Plan: Schedule the pt for an MRI every 3 months. Pt has a CT scheduled on 8/26 and another cycle of chemotherapy (Alimta) on 8/29.   This document serves as a record of services personally performed by JKyung Rudd MD. It was created on his behalf by JDarcus Austin a trained medical scribe. The creation of this record is based on the scribe's personal observations and the provider's statements to them. This document has been checked and approved by the attending provider.     JJodelle Gross M.D., Ph.D.

## 2015-04-13 NOTE — Progress Notes (Signed)
Follow up s/p rad SRS Brain  , MR Brain done on 04/09/15 here fo results, no c/o pain, head aches, nasuea or blurred vision no dizzy ness, appetite good,  stil takes trental with vitamin E, next CT 04/24/15 scheduled and chemotherapy 04/27/15 8:51 AM BP 121/57 mmHg  Pulse 98  Temp(Src) 98.2 F (36.8 C) (Oral)  Resp 16  Ht '5\' 4"'$  (1.626 m)  Wt 142 lb 14.4 oz (64.819 kg)  BMI 24.52 kg/m2  SpO2 100%  Wt Readings from Last 3 Encounters:  04/13/15 142 lb 14.4 oz (64.819 kg)  04/06/15 141 lb 4.8 oz (64.093 kg)  03/16/15 139 lb 4.8 oz (63.186 kg)   8:51 AM

## 2015-04-14 ENCOUNTER — Encounter: Payer: Self-pay | Admitting: Radiation Oncology

## 2015-04-17 ENCOUNTER — Ambulatory Visit: Payer: BLUE CROSS/BLUE SHIELD | Admitting: Internal Medicine

## 2015-04-24 ENCOUNTER — Encounter (HOSPITAL_COMMUNITY): Payer: Self-pay

## 2015-04-24 ENCOUNTER — Ambulatory Visit (HOSPITAL_COMMUNITY)
Admission: RE | Admit: 2015-04-24 | Discharge: 2015-04-24 | Disposition: A | Discharge status: Home / Self Care | Payer: BLUE CROSS/BLUE SHIELD | Source: Ambulatory Visit | Attending: Internal Medicine | Admitting: Internal Medicine

## 2015-04-24 ENCOUNTER — Other Ambulatory Visit: Payer: BLUE CROSS/BLUE SHIELD

## 2015-04-24 DIAGNOSIS — C3411 Malignant neoplasm of upper lobe, right bronchus or lung: Secondary | ICD-10-CM | POA: Diagnosis not present

## 2015-04-24 MED ORDER — IOHEXOL 300 MG/ML  SOLN
100.0000 mL | Freq: Once | INTRAMUSCULAR | Status: AC | PRN
  Administered 2015-04-24: 100 mL via INTRAVENOUS

## 2015-04-27 ENCOUNTER — Other Ambulatory Visit: Payer: BLUE CROSS/BLUE SHIELD

## 2015-04-27 ENCOUNTER — Ambulatory Visit (HOSPITAL_BASED_OUTPATIENT_CLINIC_OR_DEPARTMENT_OTHER): Payer: BLUE CROSS/BLUE SHIELD

## 2015-04-27 ENCOUNTER — Telehealth: Payer: Self-pay | Admitting: *Deleted

## 2015-04-27 ENCOUNTER — Telehealth: Payer: Self-pay | Admitting: Internal Medicine

## 2015-04-27 ENCOUNTER — Other Ambulatory Visit (HOSPITAL_BASED_OUTPATIENT_CLINIC_OR_DEPARTMENT_OTHER): Payer: BLUE CROSS/BLUE SHIELD

## 2015-04-27 ENCOUNTER — Ambulatory Visit (HOSPITAL_BASED_OUTPATIENT_CLINIC_OR_DEPARTMENT_OTHER): Payer: BLUE CROSS/BLUE SHIELD | Admitting: Physician Assistant

## 2015-04-27 ENCOUNTER — Encounter: Payer: Self-pay | Admitting: Physician Assistant

## 2015-04-27 VITALS — BP 99/49 | HR 65 | Temp 98.5°F | Resp 18 | Ht 64.0 in | Wt 142.5 lb

## 2015-04-27 DIAGNOSIS — C3431 Malignant neoplasm of lower lobe, right bronchus or lung: Secondary | ICD-10-CM

## 2015-04-27 DIAGNOSIS — C3411 Malignant neoplasm of upper lobe, right bronchus or lung: Secondary | ICD-10-CM

## 2015-04-27 DIAGNOSIS — Z5111 Encounter for antineoplastic chemotherapy: Secondary | ICD-10-CM

## 2015-04-27 DIAGNOSIS — F341 Dysthymic disorder: Secondary | ICD-10-CM | POA: Diagnosis not present

## 2015-04-27 DIAGNOSIS — C7931 Secondary malignant neoplasm of brain: Secondary | ICD-10-CM

## 2015-04-27 DIAGNOSIS — C7949 Secondary malignant neoplasm of other parts of nervous system: Secondary | ICD-10-CM

## 2015-04-27 LAB — COMPREHENSIVE METABOLIC PANEL (CC13)
ALT: 8 U/L (ref 0–55)
AST: 9 U/L (ref 5–34)
Albumin: 3.8 g/dL (ref 3.5–5.0)
Alkaline Phosphatase: 89 U/L (ref 40–150)
Anion Gap: 10 mEq/L (ref 3–11)
BUN: 12.8 mg/dL (ref 7.0–26.0)
CO2: 23 mEq/L (ref 22–29)
Calcium: 9.5 mg/dL (ref 8.4–10.4)
Chloride: 108 mEq/L (ref 98–109)
Creatinine: 0.7 mg/dL (ref 0.6–1.1)
EGFR: 86 mL/min/{1.73_m2} — ABNORMAL LOW (ref >90)
Glucose: 121 mg/dl (ref 70–140)
Potassium: 3.8 mEq/L (ref 3.5–5.1)
Sodium: 141 mEq/L (ref 136–145)
Total Bilirubin: 0.45 mg/dL (ref 0.20–1.20)
Total Protein: 6.9 g/dL (ref 6.4–8.3)

## 2015-04-27 LAB — CBC WITH DIFFERENTIAL/PLATELET
BASO%: 0 % (ref 0.0–2.0)
Basophils Absolute: 0 10*3/uL (ref 0.0–0.1)
EOS%: 0 % (ref 0.0–7.0)
Eosinophils Absolute: 0 10*3/uL (ref 0.0–0.5)
HCT: 37.9 % (ref 34.8–46.6)
HGB: 12.2 g/dL (ref 11.6–15.9)
LYMPH%: 4.1 % — ABNORMAL LOW (ref 14.0–49.7)
MCH: 32.3 pg (ref 25.1–34.0)
MCHC: 32.2 g/dL (ref 31.5–36.0)
MCV: 100.3 fL (ref 79.5–101.0)
MONO#: 0.6 10*3/uL (ref 0.1–0.9)
MONO%: 8.3 % (ref 0.0–14.0)
NEUT#: 6.7 10*3/uL — ABNORMAL HIGH (ref 1.5–6.5)
NEUT%: 87.6 % — ABNORMAL HIGH (ref 38.4–76.8)
Platelets: 285 10*3/uL (ref 145–400)
RBC: 3.78 10*6/uL (ref 3.70–5.45)
RDW: 15.6 % — ABNORMAL HIGH (ref 11.2–14.5)
WBC: 7.6 10*3/uL (ref 3.9–10.3)
lymph#: 0.3 10*3/uL — ABNORMAL LOW (ref 0.9–3.3)

## 2015-04-27 MED ORDER — SODIUM CHLORIDE 0.9 % IV SOLN
Freq: Once | INTRAVENOUS | Status: AC
  Administered 2015-04-27: 10:00:00 via INTRAVENOUS

## 2015-04-27 MED ORDER — SODIUM CHLORIDE 0.9 % IV SOLN
500.0000 mg/m2 | Freq: Once | INTRAVENOUS | Status: AC
  Administered 2015-04-27: 800 mg via INTRAVENOUS
  Filled 2015-04-27: qty 32

## 2015-04-27 MED ORDER — CYANOCOBALAMIN 1000 MCG/ML IJ SOLN
INTRAMUSCULAR | Status: AC
  Filled 2015-04-27: qty 1

## 2015-04-27 MED ORDER — CYANOCOBALAMIN 1000 MCG/ML IJ SOLN: 1000.0000 ug | Freq: Once | INTRAMUSCULAR | Status: DC

## 2015-04-27 MED ORDER — SODIUM CHLORIDE 0.9 % IV SOLN
Freq: Once | INTRAVENOUS | Status: AC
  Administered 2015-04-27: 10:00:00 via INTRAVENOUS
  Filled 2015-04-27: qty 4

## 2015-04-27 NOTE — Patient Instructions (Signed)
Your recent restaging CT scan revealed stable disease Continue labs and maintenance chemotherapy as scheduled Follow-up in 3 weeks prior to next scheduled cycle of maintenance chemotherapy

## 2015-04-27 NOTE — Progress Notes (Signed)
[facility] [phone]   Fax:(336) [place], [place] [address] [place] [address]  DIAGNOSIS: Stage IV (T2a, N0, M1b) non-small cell lung cancer consistent with adenocarcinoma with negative EGFR mutation and negative ALK gene translocation diagnosed in January of 2015 presented with right upper lobe lung mass in addition to a solitary brain metastasis status post stereotactic to a solitary brain metastasis as well as radiotherapy to the right upper lobe lung mass.  PRIOR THERAPY:  1) Status post stereotactic radiotherapy to a solitary right parietal brain lesion under the care of Dr. [name] on 10/16/2013. 2) Status post palliative radiotherapy to the right lung tumor under the care of Dr. [name] completed on 12/05/2013. 3) Systemic chemotherapy with carboplatin for AUC of 5 and Alimta 500 mg/M2 every 3 weeks. First dose Jan 06 2014. Status post 6 cycles.  CURRENT THERAPY: Systemic chemotherapy with maintenance Alimta 500 mg/M2 every 3 weeks. Status post 15 cycles.  INTERVAL HISTORY: Alexis [name] 64 y.o. female returns to the clinic today for followup visit accompanied by her husband. The patient is feeling fine today. She is tolerating her maintenance chemotherapy with Alimta fairly well. She denied having any significant chest pain, shortness of breath, cough or hemoptysis. She has no fever or chills, no nausea or vomiting. She denied having any significant weight loss or night sweats. She does report some difficulty sleeping but admits that she does nap frequently throughout the day. She recently had a restaging CT scan of the chest, abdomen and pelvis and presents to discuss the results.   MEDICAL HISTORY: Past Medical History  Diagnosis Date  . Ileus, postoperative 11/18/2013  . Physical deconditioning 11/18/2013  . Severe protein-calorie malnutrition 11/18/2013  . Anemia 11/18/2013  . History of radiation therapy  10-28-13- 12-05-13    lung ca 50 Gy/23f  . Anxiety   . Hx of radiation therapy 10/16/13    rt parietal brain/20Gy  . 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  MEDICATIONS:  Current Outpatient Prescriptions  Medication Sig Dispense Refill  . acetaminophen (TYLENOL) 500 MG tablet Take 500 mg by mouth every 6 (six) hours as needed for mild pain or headache.    . ALPRAZolam (XANAX) 0.25 MG tablet TAKE ONE TABLET BY MOUTH THREE TIMES DAILY AS NEEDED FOR ANXIETY 45 tablet 0  . Alum & Mag Hydroxide-Simeth (MAGIC MOUTHWASH) SOLN Take 5 mLs by mouth 4 (four) times daily as needed for mouth pain. (Patient not taking: Reported on 04/06/2015) 160 mL 0  . Ascorbic Acid (VITAMIN C GUMMIE PO) Take 1 each by mouth every morning.    .Marland Kitchendexamethasone (DECADRON) 4 MG tablet 4 mg by mouth twice a day the day before, day of and day after the chemotherapy every 3 weeks 40 tablet 1  . folic acid (FOLVITE) 1 MG tablet Take 1 tablet (1 mg total) by mouth daily. 30 tablet 2  . Multiple Vitamin (MULTIVITAMIN WITH MINERALS) TABS tablet Take 1 tablet by mouth every morning.    . ondansetron (ZOFRAN) 8 MG tablet Take 1 tablet (8 mg total) by mouth every 8 (eight) hours as needed for nausea or vomiting. (Patient not taking: Reported on 03/16/2015) 30 tablet 0  . OVER THE COUNTER MEDICATION Take 1 tablet by mouth every morning. (Vitamin A)    . pentoxifylline (  TRENTAL) 400 MG CR tablet Take 1 tablet (400 mg total) by mouth 2 (two) times daily. Patient is also to take vitamin E 400 mg BID with trental. 60 tablet 3  . prochlorperazine (COMPAZINE) 10 MG tablet Take 1 tablet (10 mg total) by mouth every 6 (six) hours as needed for nausea or vomiting. (Patient not taking: Reported on 02/24/2015) 60 tablet 0  . Zinc 50 MG TABS Take 1 tablet by mouth every morning.     No current facility-administered medications for this visit.    SURGICAL HISTORY:    Past Surgical History  Procedure Laterality Date  . Video bronchoscopy Bilateral 08/30/2013    Procedure: VIDEO BRONCHOSCOPY WITH FLUORO;  Surgeon: Tanda Rockers, MD;  Location: [facility];  Service: Cardiopulmonary;  Laterality: Bilateral;  . Abdominal hysterectomy    . Laparotomy N/A 11/03/2013    Procedure: EXPLORATORY LAPAROTOMY, DRAINAGE OF INTRA  ABDOMINAL ABSCESSES, MOBILIZATION OF SPLENIC FLEXURE, SIGMOID COLECTOMY WITH COLOSTOMY;  Surgeon: Odis Hollingshead, MD;  Location: [facility];  Service: General;  Laterality: N/A;  . Colostomy takedown N/A 07/10/2014    Procedure: LAPAROSCOPIC LYSIS OF ADHESIONS (90 MIN) LAPAROSCOPIC ASSISTED COLOSTOMY CLOSURE, RIGID PROCTOSIGMOIDOSCOPY;  Surgeon: Jackolyn Confer, MD;  Location: [facility];  Service: General;  Laterality: N/A;    REVIEW OF SYSTEMS:  Constitutional: negative Eyes: negative Ears, nose, mouth, throat, and face: negative Respiratory: negative Cardiovascular: negative Gastrointestinal: negative Genitourinary:negative Integument/breast: negative Hematologic/lymphatic: negative Musculoskeletal:negative Neurological: negative Behavioral/Psych: negative Endocrine: negative Allergic/Immunologic: negative   PHYSICAL EXAMINATION: General appearance: alert, cooperative, fatigued and no distress Head: Normocephalic, without obvious abnormality, atraumatic Neck: no adenopathy, no JVD, supple, symmetrical, trachea midline and thyroid not enlarged, symmetric, no tenderness/mass/nodules Lymph nodes: Cervical, supraclavicular, and axillary nodes normal. Resp: clear to auscultation bilaterally Back: symmetric, no curvature. ROM normal. No CVA tenderness. Cardio: regular rate and rhythm, S1, S2 normal, no murmur, click, rub or gallop GI: soft, non-tender; bowel sounds normal; no masses,  no organomegaly Extremities: extremities normal, atraumatic, no cyanosis or edema Neurologic: Alert and oriented X 3, normal strength and tone. Normal  symmetric reflexes. Normal coordination and gait  ECOG PERFORMANCE STATUS: 0 - Asymptomatic  Blood pressure 99/49, pulse 65, temperature 98.5 F (36.9 C), temperature source Oral, resp. rate 18, height _0  (1.626 m), weight 142 lb 8 oz (64.638 kg), SpO2 100 %.  LABORATORY DATA: Lab Results  Component Value Date   WBC 7.6 04/27/2015   HGB 12.2 04/27/2015   HCT 37.9 04/27/2015   MCV 100.3 04/27/2015   PLT 285 04/27/2015      Chemistry      Component Value Date/Time   NA 141 04/27/2015 0836   NA 139 07/12/2014 0512   K 3.8 04/27/2015 0836   K 3.9 07/12/2014 0512   CL 100 07/12/2014 0512   CO2 23 04/27/2015 0836   CO2 32 07/12/2014 0512   BUN 12.8 04/27/2015 0836   BUN 5* 07/12/2014 0512   CREATININE 0.7 04/27/2015 0836   CREATININE 0.55 07/12/2014 0512      Component Value Date/Time   CALCIUM 9.5 04/27/2015 0836   CALCIUM 9.0 07/12/2014 0512   ALKPHOS 89 04/27/2015 0836   ALKPHOS 91 07/10/2014 0955   AST 9 04/27/2015 0836   AST 15 07/10/2014 0955   ALT 8 04/27/2015 0836   ALT 8 07/10/2014 0955   BILITOT 0.45 04/27/2015 0836   BILITOT 0.4 07/10/2014 0955       RADIOGRAPHIC STUDIES: Ct Chest W Contrast  04/24/2015  CLINICAL DATA:  Restaging lung cancer diagnosed in December 2014 with intracranial metastatic disease. Radiation therapy completed. Chemotherapy in progress. Subsequent encounter.  EXAM: CT CHEST, ABDOMEN, AND PELVIS WITH CONTRAST  TECHNIQUE: Multidetector CT imaging of the chest, abdomen and pelvis was performed following the standard protocol during bolus administration of intravenous contrast.  CONTRAST:  118m OMNIPAQUE IOHEXOL 300 MG/ML  SOLN  COMPARISON:  CT 02/20/2015 and 12/19/2014.  FINDINGS: CT CHEST FINDINGS  Mediastinum/Nodes: There are no enlarged mediastinal, hilar or axillary lymph nodes. There is a 7 mm right hilar node on image 28 which previously measured 9 mm. 9 mm low-density right thyroid nodule on image number 6 is unchanged. The  esophagus appears unremarkable. The heart size is normal. There is no pericardial effusion. Stable mild atherosclerosis, primarily involving the aortic arch.  Lungs/Pleura: There is no pleural effusion.The residual right upper lobe nodule has not significantly changed, measuring 2.5 x 1.5 cm on image 14. The small irregular nodular density more anteriorly in the right upper lobe on image number 18 is stable. There is also stable ill-defined opacity in the superior segment of the right lower lobe, probably postradiation change based on stability. No new or enlarging pulmonary nodules identified. Emphysema with biapical scarring and paraseptal blebs again noted.  Musculoskeletal/Chest wall: No chest wall mass or suspicious osseous findings.  CT ABDOMEN AND PELVIS FINDINGS  Hepatobiliary: The liver is normal in density without focal abnormality. No evidence of gallstones, gallbladder wall thickening or biliary dilatation.  Pancreas: Unremarkable. No pancreatic ductal dilatation or surrounding inflammatory changes.  Spleen: Normal in size without focal abnormality.  Adrenals/Urinary Tract: Both adrenal glands appear normal. The kidneys appear normal without evidence of urinary tract calculus, suspicious lesion or hydronephrosis. No bladder abnormalities are seen.  Stomach/Bowel: No evidence of bowel wall thickening, distention or surrounding inflammatory change. Rectosigmoid anastomosis noted. The appendix appears normal.  Vascular/Lymphatic: There are no enlarged abdominal or pelvic lymph nodes. Atherosclerosis of the aorta, its branches and the iliac arteries noted.  Reproductive: Hysterectomy. The adnexal regions appear unchanged without evidence of mass.  Other: Postsurgical changes within the anterior abdominal wall are stable. There is no herniated bowel.  Musculoskeletal: No acute or significant osseous findings.  IMPRESSION: 1. Overall stable CTs of the chest, abdomen and pelvis. 2. The residual right upper lobe  nodule at site of original bronchogenic carcinoma is unchanged. Likewise, small nodular density more anteriorly in the right upper lobe and patchy airspace opacity in the superior segment of the right lower lobe are stable. 3. No distant metastases identified.   Electronically Signed   By: WRichardean SaleM.D.   On: 04/24/2015 10:10   Mr BJeri CosWMAContrast  04/09/2015   CLINICAL DATA:  64year old female Metastatic lung cancer with brain metastasis. Radiosurgery to a solitary right parietal target completed on 10/16/2013. Restaging. Subsequent encounter.  EXAM: MRI HEAD WITHOUT AND WITH CONTRAST  TECHNIQUE: Multiplanar, multiecho pulse sequences of the brain and surrounding structures were obtained without and with intravenous contrast.  CONTRAST:  161mMULTIHANCE GADOBENATE DIMEGLUMINE 529 MG/ML IV SOLN  COMPARISON:  01/09/2015 and earlier.  FINDINGS: The residual rectangular, mildly lobulated focus of enhancement in the treated right parietal lobe has minimally increased in size since December 2015 (12-14 mm now versus 11-13 mm previously). Surrounding FLAIR hyperintensity however is stable since that time, and there is no associated mass effect. Hemosiderin deposition is increased (series 5, image 19 today versus series 6, image 19 previously). The lesion is on  series 10, image 112 today.  Wrap artifact in the right cerebellar hemisphere on series 10, image 39. No other abnormal intracranial enhancement. No intracranial mass effect or other mass lesion.  No restricted diffusion to suggest acute infarction. No ventriculomegaly, extra-axial collection or acute intracranial hemorrhage. Cervicomedullary junction and pituitary are within normal limits. Major intracranial vascular flow voids are stable. Outside of the right parietal lobe, stable gray and white matter signal with patchy nonspecific T2 and FLAIR hyperintensity.  Visualized cervical spine appears normal. Bone marrow signal is stable and within normal  limits. Chronic left scalp sebaceous cyst incidentally noted. Visible internal auditory structures appear normal. Stable paranasal sinuses and mastoids. Orbits soft tissues appear normal.  IMPRESSION: 1. Minimal enlargement of the residual enhancing right parietal lobe lesion since December 2015, strongly favored to be due to treatment effect rather than disease progression. Hemosiderin deposition at the lesion has also increased. Surrounding FLAIR hyperintensity is unchanged. 2. No new brain metastasis or new intracranial abnormality.   Electronically Signed   By: Genevie Ann M.D.   On: 04/09/2015 12:26   Ct Abdomen Pelvis W Contrast  04/24/2015   CLINICAL DATA:  Restaging lung cancer diagnosed in December 2014 with intracranial metastatic disease. Radiation therapy completed. Chemotherapy in progress. Subsequent encounter.  EXAM: CT CHEST, ABDOMEN, AND PELVIS WITH CONTRAST  TECHNIQUE: Multidetector CT imaging of the chest, abdomen and pelvis was performed following the standard protocol during bolus administration of intravenous contrast.  CONTRAST:  125m OMNIPAQUE IOHEXOL 300 MG/ML  SOLN  COMPARISON:  CT 02/20/2015 and 12/19/2014.  FINDINGS: CT CHEST FINDINGS  Mediastinum/Nodes: There are no enlarged mediastinal, hilar or axillary lymph nodes. There is a 7 mm right hilar node on image 28 which previously measured 9 mm. 9 mm low-density right thyroid nodule on image number 6 is unchanged. The esophagus appears unremarkable. The heart size is normal. There is no pericardial effusion. Stable mild atherosclerosis, primarily involving the aortic arch.  Lungs/Pleura: There is no pleural effusion.The residual right upper lobe nodule has not significantly changed, measuring 2.5 x 1.5 cm on image 14. The small irregular nodular density more anteriorly in the right upper lobe on image number 18 is stable. There is also stable ill-defined opacity in the superior segment of the right lower lobe, probably postradiation change  based on stability. No new or enlarging pulmonary nodules identified. Emphysema with biapical scarring and paraseptal blebs again noted.  Musculoskeletal/Chest wall: No chest wall mass or suspicious osseous findings.  CT ABDOMEN AND PELVIS FINDINGS  Hepatobiliary: The liver is normal in density without focal abnormality. No evidence of gallstones, gallbladder wall thickening or biliary dilatation.  Pancreas: Unremarkable. No pancreatic ductal dilatation or surrounding inflammatory changes.  Spleen: Normal in size without focal abnormality.  Adrenals/Urinary Tract: Both adrenal glands appear normal. The kidneys appear normal without evidence of urinary tract calculus, suspicious lesion or hydronephrosis. No bladder abnormalities are seen.  Stomach/Bowel: No evidence of bowel wall thickening, distention or surrounding inflammatory change. Rectosigmoid anastomosis noted. The appendix appears normal.  Vascular/Lymphatic: There are no enlarged abdominal or pelvic lymph nodes. Atherosclerosis of the aorta, its branches and the iliac arteries noted.  Reproductive: Hysterectomy. The adnexal regions appear unchanged without evidence of mass.  Other: Postsurgical changes within the anterior abdominal wall are stable. There is no herniated bowel.  Musculoskeletal: No acute or significant osseous findings.  IMPRESSION: 1. Overall stable CTs of the chest, abdomen and pelvis. 2. The residual right upper lobe nodule at site of original  bronchogenic carcinoma is unchanged. Likewise, small nodular density more anteriorly in the right upper lobe and patchy airspace opacity in the superior segment of the right lower lobe are stable. 3. No distant metastases identified.   Electronically Signed   By: Richardean Sale M.D.   On: 04/24/2015 10:10    ASSESSMENT AND PLAN: This is a very pleasant 64 years old white female recently diagnosed with a stage IV non-small cell lung cancer, adenocarcinoma with negative EGFR mutation and negative  ALK gene translocation. The patient is status post stereotactic radiotherapy to a solitary brain metastases in addition to palliative radiotherapy to the right upper lobe lung mass. She status post 6 cycles of systemic chemotherapy with carboplatin and Alimta with improvement in her disease and she is currently undergoing maintenance chemotherapy with single agent Alimta status post 15 cycles and tolerating her treatment fairly well. Her recent restaging CT scan revealed stable disease within the chest with no evidence for distant metastasis. The CT scan results were discussed with patient and her husband. Recommended for her to continue her current maintenance treatment with single agent Alimta 500 mg/M2 every 3 weeks. She will proceed with cycle #16 today as scheduled. For history of depression, the patient on Remeron 30 mg by mouth each bedtime. For anxiety, she is currently on Xanax 0.25 mg by mouth 3 times a day as needed.  She will return for a follow-up visit in 3 weeks for reevaluation before proceeding with cycle #17.  The patient was advised to call immediately if she has any concerning symptoms in the interval. The patient voices understanding of current disease status and treatment options and is in agreement with the current care plan.  All questions were answered. The patient knows to call the clinic with any problems, questions or concerns. We can certainly see the patient much sooner if necessary.  Carlton Adam, PA-C 04/27/2015   Disclaimer: This note was dictated with voice recognition software. Similar sounding words can inadvertently be transcribed and may not be corrected upon review.

## 2015-04-27 NOTE — Telephone Encounter (Signed)
per pof tos ch pt appt-gave pt copy of avs-sent MW email to sh trmt-pt stated will look on MY CHART for time-no need for call

## 2015-04-27 NOTE — Progress Notes (Signed)
Per patient and last infusion note, pt received B12 injection last treatment.  Per Awilda Metro, PAC not necessary today.  Treatment plan to be adjusted.

## 2015-04-27 NOTE — Patient Instructions (Signed)
[facility] Discharge Instructions for Patients Receiving Chemotherapy  Today you received the following chemotherapy agents alimta  To help prevent nausea and vomiting after your treatment, we encourage you to take your nausea medication as directed  If you develop nausea and vomiting that is not controlled by your nausea medication, call the clinic.   BELOW ARE SYMPTOMS THAT SHOULD BE REPORTED IMMEDIATELY:  *FEVER GREATER THAN 100.5 F  *CHILLS WITH OR WITHOUT FEVER  NAUSEA AND VOMITING THAT IS NOT CONTROLLED WITH YOUR NAUSEA MEDICATION  *UNUSUAL SHORTNESS OF BREATH  *UNUSUAL BRUISING OR BLEEDING  TENDERNESS IN MOUTH AND THROAT WITH OR WITHOUT PRESENCE OF ULCERS  *URINARY PROBLEMS  *BOWEL PROBLEMS  UNUSUAL RASH Items with * indicate a potential emergency and should be followed up as soon as possible.  Feel free to call the clinic you have any questions or concerns. The clinic phone number is [phone].  

## 2015-04-27 NOTE — Telephone Encounter (Signed)
Per staff message and POF I have scheduled appts. Advised scheduler of appts. JMW  

## 2015-05-15 ENCOUNTER — Other Ambulatory Visit: Payer: Self-pay | Admitting: Medical Oncology

## 2015-05-15 DIAGNOSIS — C3411 Malignant neoplasm of upper lobe, right bronchus or lung: Secondary | ICD-10-CM

## 2015-05-18 ENCOUNTER — Ambulatory Visit (HOSPITAL_BASED_OUTPATIENT_CLINIC_OR_DEPARTMENT_OTHER): Payer: BLUE CROSS/BLUE SHIELD | Admitting: Internal Medicine

## 2015-05-18 ENCOUNTER — Other Ambulatory Visit (HOSPITAL_BASED_OUTPATIENT_CLINIC_OR_DEPARTMENT_OTHER): Payer: BLUE CROSS/BLUE SHIELD

## 2015-05-18 ENCOUNTER — Ambulatory Visit (HOSPITAL_BASED_OUTPATIENT_CLINIC_OR_DEPARTMENT_OTHER): Payer: BLUE CROSS/BLUE SHIELD

## 2015-05-18 ENCOUNTER — Encounter: Payer: Self-pay | Admitting: Internal Medicine

## 2015-05-18 ENCOUNTER — Telehealth: Payer: Self-pay | Admitting: Internal Medicine

## 2015-05-18 ENCOUNTER — Telehealth: Payer: Self-pay | Admitting: *Deleted

## 2015-05-18 ENCOUNTER — Encounter: Payer: Self-pay | Admitting: *Deleted

## 2015-05-18 VITALS — BP 117/49 | HR 64 | Temp 98.7°F | Resp 18 | Ht 64.0 in | Wt 144.4 lb

## 2015-05-18 DIAGNOSIS — C3411 Malignant neoplasm of upper lobe, right bronchus or lung: Secondary | ICD-10-CM | POA: Diagnosis not present

## 2015-05-18 DIAGNOSIS — C7931 Secondary malignant neoplasm of brain: Secondary | ICD-10-CM | POA: Diagnosis not present

## 2015-05-18 DIAGNOSIS — F411 Generalized anxiety disorder: Secondary | ICD-10-CM

## 2015-05-18 DIAGNOSIS — Z5111 Encounter for antineoplastic chemotherapy: Secondary | ICD-10-CM

## 2015-05-18 DIAGNOSIS — F329 Major depressive disorder, single episode, unspecified: Secondary | ICD-10-CM

## 2015-05-18 DIAGNOSIS — F419 Anxiety disorder, unspecified: Secondary | ICD-10-CM | POA: Diagnosis not present

## 2015-05-18 LAB — COMPREHENSIVE METABOLIC PANEL (CC13)
ALT: 10 U/L (ref 0–55)
AST: 12 U/L (ref 5–34)
Albumin: 3.8 g/dL (ref 3.5–5.0)
Alkaline Phosphatase: 86 U/L (ref 40–150)
Anion Gap: 9 mEq/L (ref 3–11)
BUN: 15.1 mg/dL (ref 7.0–26.0)
CO2: 24 mEq/L (ref 22–29)
Calcium: 9.5 mg/dL (ref 8.4–10.4)
Chloride: 107 mEq/L (ref 98–109)
Creatinine: 0.8 mg/dL (ref 0.6–1.1)
EGFR: 83 mL/min/{1.73_m2} — ABNORMAL LOW (ref >90)
Glucose: 124 mg/dl (ref 70–140)
Potassium: 4 mEq/L (ref 3.5–5.1)
Sodium: 141 mEq/L (ref 136–145)
Total Bilirubin: 0.34 mg/dL (ref 0.20–1.20)
Total Protein: 7 g/dL (ref 6.4–8.3)

## 2015-05-18 LAB — CBC WITH DIFFERENTIAL/PLATELET
BASO%: 0.5 % (ref 0.0–2.0)
Basophils Absolute: 0 10*3/uL (ref 0.0–0.1)
EOS%: 0 % (ref 0.0–7.0)
Eosinophils Absolute: 0 10*3/uL (ref 0.0–0.5)
HCT: 35.9 % (ref 34.8–46.6)
HGB: 12.2 g/dL (ref 11.6–15.9)
LYMPH%: 3.1 % — ABNORMAL LOW (ref 14.0–49.7)
MCH: 33.1 pg (ref 25.1–34.0)
MCHC: 33.9 g/dL (ref 31.5–36.0)
MCV: 97.7 fL (ref 79.5–101.0)
MONO#: 0.3 10*3/uL (ref 0.1–0.9)
MONO%: 3.7 % (ref 0.0–14.0)
NEUT#: 7.8 10*3/uL — ABNORMAL HIGH (ref 1.5–6.5)
NEUT%: 92.7 % — ABNORMAL HIGH (ref 38.4–76.8)
Platelets: 272 10*3/uL (ref 145–400)
RBC: 3.68 10*6/uL — ABNORMAL LOW (ref 3.70–5.45)
RDW: 14.8 % — ABNORMAL HIGH (ref 11.2–14.5)
WBC: 8.4 10*3/uL (ref 3.9–10.3)
lymph#: 0.3 10*3/uL — ABNORMAL LOW (ref 0.9–3.3)

## 2015-05-18 MED ORDER — SODIUM CHLORIDE 0.9 % IV SOLN
500.0000 mg/m2 | Freq: Once | INTRAVENOUS | Status: AC
  Administered 2015-05-18: 800 mg via INTRAVENOUS
  Filled 2015-05-18: qty 32

## 2015-05-18 MED ORDER — SODIUM CHLORIDE 0.9 % IV SOLN
Freq: Once | INTRAVENOUS | Status: AC
  Administered 2015-05-18: 11:00:00 via INTRAVENOUS
  Filled 2015-05-18: qty 4

## 2015-05-18 MED ORDER — ALPRAZOLAM 0.25 MG PO TABS: 0.2500 mg | ORAL_TABLET | Freq: Three times a day (TID) | ORAL | Status: DC | PRN

## 2015-05-18 MED ORDER — SODIUM CHLORIDE 0.9 % IV SOLN
Freq: Once | INTRAVENOUS | Status: AC
  Administered 2015-05-18: 11:00:00 via INTRAVENOUS

## 2015-05-18 NOTE — Progress Notes (Signed)
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  PRIOR THERAPY:  1) Status post stereotactic radiotherapy to a solitary right parietal brain lesion under the care of Dr. [name] on 10/16/2013. 2) Status post palliative radiotherapy to the right lung tumor under the care of Dr. [name] completed on 12/05/2013. 3) Systemic chemotherapy with carboplatin for AUC of 5 and Alimta 500 mg/M2 every 3 weeks. First dose Jan 06 2014. Status post 6 cycles.  CURRENT THERAPY: Systemic chemotherapy with maintenance Alimta 500 mg/M2 every 3 weeks. Status post 15 cycles.  INTERVAL HISTORY: Alexis [name] 64 y.o. female returns to the clinic today for followup visit accompanied by her husband. The patient is feeling fine today. She status post 15 cycles of maintenance chemotherapy. She is tolerating her maintenance chemotherapy with Alimta fairly well. She denied having any significant chest pain, shortness of breath, cough or hemoptysis. She has no fever or chills, no nausea or vomiting. She denied having any significant weight loss or night sweats. She had repeat CT scan of the chest, abdomen and pelvis performed recently and she is here for evaluation and discussion of her scan results.   MEDICAL HISTORY: Past Medical History  Diagnosis Date  . Ileus, postoperative 11/18/2013  . Physical deconditioning 11/18/2013  . Severe protein-calorie malnutrition 11/18/2013  . Anemia 11/18/2013  . History of radiation therapy 10-28-13- 12-05-13      lung ca 50 Gy/89f  . Anxiety   . Hx of radiation therapy 10/16/13    rt parietal brain/20Gy  . 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  MEDICATIONS:  Current Outpatient Prescriptions  Medication Sig Dispense Refill  . ALPRAZolam (XANAX) 0.25 MG tablet Take 1 tablet (0.25 mg total) by mouth 3 (three) times daily as needed. for anxiety 45 tablet 0  . Ascorbic Acid (VITAMIN C GUMMIE PO) Take 1 each by mouth every morning.    .Marland Kitchendexamethasone (DECADRON) 4 MG tablet 4 mg by mouth twice a day the day before, day of and day after the chemotherapy every 3 weeks 40 tablet 1  . folic acid (FOLVITE) 1 MG tablet Take 1 tablet (1 mg total) by mouth daily. 30 tablet 2  . Multiple Vitamin (MULTIVITAMIN WITH MINERALS) TABS tablet Take 1 tablet by mouth every morning.    .Marland KitchenOVER THE COUNTER MEDICATION Take 1 tablet by mouth every morning. (Vitamin A)    . pentoxifylline (TRENTAL) 400 MG CR tablet Take 1 tablet (400 mg total) by mouth 2 (two) times daily. Patient is also to take vitamin E 400 mg BID with trental. 60 tablet 3  . Zinc 50 MG TABS Take 1 tablet by mouth every morning.    .Marland Kitchenacetaminophen (TYLENOL) 500 MG tablet Take 500 mg by mouth every 6 (six) hours as needed for mild pain or headache.    . ondansetron (ZOFRAN) 8 MG tablet Take 1 tablet (8 mg total) by mouth every  8 (eight) hours as needed for nausea or vomiting. (Patient not taking: Reported on 03/16/2015) 30 tablet 0  . prochlorperazine (COMPAZINE) 10 MG tablet Take 1 tablet (10 mg total) by mouth every 6 (six) hours as needed for nausea or vomiting. (Patient not taking: Reported on 02/24/2015) 60 tablet 0   No current facility-administered medications for this visit.    SURGICAL HISTORY:  Past Surgical History  Procedure Laterality Date  . Video bronchoscopy Bilateral 08/30/2013    Procedure: VIDEO BRONCHOSCOPY WITH FLUORO;  Surgeon: Tanda Rockers, MD;   Location: [facility];  Service: Cardiopulmonary;  Laterality: Bilateral;  . Abdominal hysterectomy    . Laparotomy N/A 11/03/2013    Procedure: EXPLORATORY LAPAROTOMY, DRAINAGE OF INTRA  ABDOMINAL ABSCESSES, MOBILIZATION OF SPLENIC FLEXURE, SIGMOID COLECTOMY WITH COLOSTOMY;  Surgeon: Odis Hollingshead, MD;  Location: [facility];  Service: General;  Laterality: N/A;  . Colostomy takedown N/A 07/10/2014    Procedure: LAPAROSCOPIC LYSIS OF ADHESIONS (90 MIN) LAPAROSCOPIC ASSISTED COLOSTOMY CLOSURE, RIGID PROCTOSIGMOIDOSCOPY;  Surgeon: Jackolyn Confer, MD;  Location: [facility];  Service: General;  Laterality: N/A;    REVIEW OF SYSTEMS:  Constitutional: negative Eyes: negative Ears, nose, mouth, throat, and face: negative Respiratory: negative Cardiovascular: negative Gastrointestinal: negative Genitourinary:negative Integument/breast: negative Hematologic/lymphatic: negative Musculoskeletal:negative Neurological: negative Behavioral/Psych: negative Endocrine: negative Allergic/Immunologic: negative   PHYSICAL EXAMINATION: General appearance: alert, cooperative, fatigued and no distress Head: Normocephalic, without obvious abnormality, atraumatic Neck: no adenopathy, no JVD, supple, symmetrical, trachea midline and thyroid not enlarged, symmetric, no tenderness/mass/nodules Lymph nodes: Cervical, supraclavicular, and axillary nodes normal. Resp: clear to auscultation bilaterally Back: symmetric, no curvature. ROM normal. No CVA tenderness. Cardio: regular rate and rhythm, S1, S2 normal, no murmur, click, rub or gallop GI: soft, non-tender; bowel sounds normal; no masses,  no organomegaly Extremities: extremities normal, atraumatic, no cyanosis or edema Neurologic: Alert and oriented X 3, normal strength and tone. Normal symmetric reflexes. Normal coordination and gait  ECOG PERFORMANCE STATUS: 0 - Asymptomatic  Blood pressure 117/49, pulse 64, temperature 98.7 F (37.1 C), temperature source  Oral, resp. rate 18, height 5' 4"  (1.626 m), weight 144 lb 6.4 oz (65.499 kg), SpO2 99 %.  LABORATORY DATA: Lab Results  Component Value Date   WBC 8.4 05/18/2015   HGB 12.2 05/18/2015   HCT 35.9 05/18/2015   MCV 97.7 05/18/2015   PLT 272 05/18/2015      Chemistry      Component Value Date/Time   NA 141 05/18/2015 0911   NA 139 07/12/2014 0512   K 4.0 05/18/2015 0911   K 3.9 07/12/2014 0512   CL 100 07/12/2014 0512   CO2 24 05/18/2015 0911   CO2 32 07/12/2014 0512   BUN 15.1 05/18/2015 0911   BUN 5* 07/12/2014 0512   CREATININE 0.8 05/18/2015 0911   CREATININE 0.55 07/12/2014 0512      Component Value Date/Time   CALCIUM 9.5 05/18/2015 0911   CALCIUM 9.0 07/12/2014 0512   ALKPHOS 86 05/18/2015 0911   ALKPHOS 91 07/10/2014 0955   AST 12 05/18/2015 0911   AST 15 07/10/2014 0955   ALT 10 05/18/2015 0911   ALT 8 07/10/2014 0955   BILITOT 0.34 05/18/2015 0911   BILITOT 0.4 07/10/2014 0955       RADIOGRAPHIC STUDIES: Ct Chest W Contrast  04/24/2015   CLINICAL DATA:  Restaging lung cancer diagnosed in December 2014 with intracranial metastatic disease. Radiation therapy completed. Chemotherapy in progress. Subsequent encounter.  EXAM: CT CHEST, ABDOMEN, AND PELVIS  WITH CONTRAST  TECHNIQUE: Multidetector CT imaging of the chest, abdomen and pelvis was performed following the standard protocol during bolus administration of intravenous contrast.  CONTRAST:  165m OMNIPAQUE IOHEXOL 300 MG/ML  SOLN  COMPARISON:  CT 02/20/2015 and 12/19/2014.  FINDINGS: CT CHEST FINDINGS  Mediastinum/Nodes: There are no enlarged mediastinal, hilar or axillary lymph nodes. There is a 7 mm right hilar node on image 28 which previously measured 9 mm. 9 mm low-density right thyroid nodule on image number 6 is unchanged. The esophagus appears unremarkable. The heart size is normal. There is no pericardial effusion. Stable mild atherosclerosis, primarily involving the aortic arch.  Lungs/Pleura: There is no  pleural effusion.The residual right upper lobe nodule has not significantly changed, measuring 2.5 x 1.5 cm on image 14. The small irregular nodular density more anteriorly in the right upper lobe on image number 18 is stable. There is also stable ill-defined opacity in the superior segment of the right lower lobe, probably postradiation change based on stability. No new or enlarging pulmonary nodules identified. Emphysema with biapical scarring and paraseptal blebs again noted.  Musculoskeletal/Chest wall: No chest wall mass or suspicious osseous findings.  CT ABDOMEN AND PELVIS FINDINGS  Hepatobiliary: The liver is normal in density without focal abnormality. No evidence of gallstones, gallbladder wall thickening or biliary dilatation.  Pancreas: Unremarkable. No pancreatic ductal dilatation or surrounding inflammatory changes.  Spleen: Normal in size without focal abnormality.  Adrenals/Urinary Tract: Both adrenal glands appear normal. The kidneys appear normal without evidence of urinary tract calculus, suspicious lesion or hydronephrosis. No bladder abnormalities are seen.  Stomach/Bowel: No evidence of bowel wall thickening, distention or surrounding inflammatory change. Rectosigmoid anastomosis noted. The appendix appears normal.  Vascular/Lymphatic: There are no enlarged abdominal or pelvic lymph nodes. Atherosclerosis of the aorta, its branches and the iliac arteries noted.  Reproductive: Hysterectomy. The adnexal regions appear unchanged without evidence of mass.  Other: Postsurgical changes within the anterior abdominal wall are stable. There is no herniated bowel.  Musculoskeletal: No acute or significant osseous findings.  IMPRESSION: 1. Overall stable CTs of the chest, abdomen and pelvis. 2. The residual right upper lobe nodule at site of original bronchogenic carcinoma is unchanged. Likewise, small nodular density more anteriorly in the right upper lobe and patchy airspace opacity in the superior  segment of the right lower lobe are stable. 3. No distant metastases identified.   Electronically Signed   By: WRichardean SaleM.D.   On: 04/24/2015 10:10   Ct Abdomen Pelvis W Contrast  04/24/2015   CLINICAL DATA:  Restaging lung cancer diagnosed in December 2014 with intracranial metastatic disease. Radiation therapy completed. Chemotherapy in progress. Subsequent encounter.  EXAM: CT CHEST, ABDOMEN, AND PELVIS WITH CONTRAST  TECHNIQUE: Multidetector CT imaging of the chest, abdomen and pelvis was performed following the standard protocol during bolus administration of intravenous contrast.  CONTRAST:  1039mOMNIPAQUE IOHEXOL 300 MG/ML  SOLN  COMPARISON:  CT 02/20/2015 and 12/19/2014.  FINDINGS: CT CHEST FINDINGS  Mediastinum/Nodes: There are no enlarged mediastinal, hilar or axillary lymph nodes. There is a 7 mm right hilar node on image 28 which previously measured 9 mm. 9 mm low-density right thyroid nodule on image number 6 is unchanged. The esophagus appears unremarkable. The heart size is normal. There is no pericardial effusion. Stable mild atherosclerosis, primarily involving the aortic arch.  Lungs/Pleura: There is no pleural effusion.The residual right upper lobe nodule has not significantly changed, measuring 2.5 x 1.5 cm on image 14. The  small irregular nodular density more anteriorly in the right upper lobe on image number 18 is stable. There is also stable ill-defined opacity in the superior segment of the right lower lobe, probably postradiation change based on stability. No new or enlarging pulmonary nodules identified. Emphysema with biapical scarring and paraseptal blebs again noted.  Musculoskeletal/Chest wall: No chest wall mass or suspicious osseous findings.  CT ABDOMEN AND PELVIS FINDINGS  Hepatobiliary: The liver is normal in density without focal abnormality. No evidence of gallstones, gallbladder wall thickening or biliary dilatation.  Pancreas: Unremarkable. No pancreatic ductal  dilatation or surrounding inflammatory changes.  Spleen: Normal in size without focal abnormality.  Adrenals/Urinary Tract: Both adrenal glands appear normal. The kidneys appear normal without evidence of urinary tract calculus, suspicious lesion or hydronephrosis. No bladder abnormalities are seen.  Stomach/Bowel: No evidence of bowel wall thickening, distention or surrounding inflammatory change. Rectosigmoid anastomosis noted. The appendix appears normal.  Vascular/Lymphatic: There are no enlarged abdominal or pelvic lymph nodes. Atherosclerosis of the aorta, its branches and the iliac arteries noted.  Reproductive: Hysterectomy. The adnexal regions appear unchanged without evidence of mass.  Other: Postsurgical changes within the anterior abdominal wall are stable. There is no herniated bowel.  Musculoskeletal: No acute or significant osseous findings.  IMPRESSION: 1. Overall stable CTs of the chest, abdomen and pelvis. 2. The residual right upper lobe nodule at site of original bronchogenic carcinoma is unchanged. Likewise, small nodular density more anteriorly in the right upper lobe and patchy airspace opacity in the superior segment of the right lower lobe are stable. 3. No distant metastases identified.   Electronically Signed   By: Richardean Sale M.D.   On: 04/24/2015 10:10    ASSESSMENT AND PLAN: This is a very pleasant 64 years old white female recently diagnosed with a stage IV non-small cell lung cancer, adenocarcinoma with negative EGFR mutation and negative ALK gene translocation. The patient is status post stereotactic radiotherapy to a solitary brain metastases in addition to palliative radiotherapy to the right upper lobe lung mass. She status post 6 cycles of systemic chemotherapy with carboplatin and Alimta with improvement in her disease and she is currently undergoing maintenance chemotherapy with single agent Alimta status post 15 cycles and tolerating her treatment fairly well. Her  recent CT scan of the chest, abdomen pelvis showed no evidence for disease progression. I discussed the scan results with the patient and her husband. I recommended for her to continue her current maintenance treatment with single agent Alimta 500 mg/M2 every 3 weeks. She will proceed with cycle #16 today as scheduled. For history of depression, the patient on Remeron 30 mg by mouth each bedtime. For anxiety, she is currently on Xanax 0.25 mg by mouth 3 times a day as needed and she was given a refill of her medication.  She will come back for follow-up visit in 3 weeks for reevaluation before proceeding with cycle #17.  The patient was advised to call immediately if she has any concerning symptoms in the interval. The patient voices understanding of current disease status and treatment options and is in agreement with the current care plan.  All questions were answered. The patient knows to call the clinic with any problems, questions or concerns. We can certainly see the patient much sooner if necessary.  Disclaimer: This note was dictated with voice recognition software. Similar sounding words can inadvertently be transcribed and may not be corrected upon review.

## 2015-05-18 NOTE — Patient Instructions (Signed)
[facility] Discharge Instructions for Patients Receiving Chemotherapy  Today you received the following chemotherapy agents; Alimta.   To help prevent nausea and vomiting after your treatment, we encourage you to take your nausea medication as directed.    If you develop nausea and vomiting that is not controlled by your nausea medication, call the clinic.   BELOW ARE SYMPTOMS THAT SHOULD BE REPORTED IMMEDIATELY:  *FEVER GREATER THAN 100.5 F  *CHILLS WITH OR WITHOUT FEVER  NAUSEA AND VOMITING THAT IS NOT CONTROLLED WITH YOUR NAUSEA MEDICATION  *UNUSUAL SHORTNESS OF BREATH  *UNUSUAL BRUISING OR BLEEDING  TENDERNESS IN MOUTH AND THROAT WITH OR WITHOUT PRESENCE OF ULCERS  *URINARY PROBLEMS  *BOWEL PROBLEMS  UNUSUAL RASH Items with * indicate a potential emergency and should be followed up as soon as possible.  Feel free to call the clinic you have any questions or concerns. The clinic phone number is [phone].  Please show the CHEMO ALERT CARD at check-in to the Emergency Department and triage nurse.   

## 2015-05-18 NOTE — Telephone Encounter (Signed)
Per staff message and POF I have scheduled appts. Advised scheduler of appts. JMW  

## 2015-05-18 NOTE — Telephone Encounter (Signed)
Pt confirmed labs/ov per 09/19 POF, gave pt AVS and Calendar.... KJ, sent msg to add chemo

## 2015-05-18 NOTE — Progress Notes (Signed)
Oncology Nurse Navigator Documentation  Oncology Nurse Navigator Flowsheets 05/18/2015  Navigator Encounter Type Other;Clinic/MDC  Patient Visit Type Medonc  Treatment Phase Other/follow up.  Patient is on maintained therapy at this time.  Patient has no questions nor concerns at this time. She states she feels great and last scan showed stable disease.   Barriers/Navigation Needs No barriers at this time  Support Groups/Services -  Time Spent with Patient 15

## 2015-06-05 ENCOUNTER — Other Ambulatory Visit: Payer: Self-pay | Admitting: *Deleted

## 2015-06-05 DIAGNOSIS — C3411 Malignant neoplasm of upper lobe, right bronchus or lung: Secondary | ICD-10-CM

## 2015-06-08 ENCOUNTER — Encounter: Payer: Self-pay | Admitting: Oncology

## 2015-06-08 ENCOUNTER — Other Ambulatory Visit (HOSPITAL_BASED_OUTPATIENT_CLINIC_OR_DEPARTMENT_OTHER): Payer: BLUE CROSS/BLUE SHIELD

## 2015-06-08 ENCOUNTER — Ambulatory Visit (HOSPITAL_BASED_OUTPATIENT_CLINIC_OR_DEPARTMENT_OTHER): Payer: BLUE CROSS/BLUE SHIELD | Admitting: Oncology

## 2015-06-08 ENCOUNTER — Ambulatory Visit (HOSPITAL_BASED_OUTPATIENT_CLINIC_OR_DEPARTMENT_OTHER): Payer: BLUE CROSS/BLUE SHIELD

## 2015-06-08 VITALS — BP 104/34 | HR 64 | Temp 98.2°F | Resp 18 | Ht 64.0 in | Wt 147.6 lb

## 2015-06-08 DIAGNOSIS — C3411 Malignant neoplasm of upper lobe, right bronchus or lung: Secondary | ICD-10-CM

## 2015-06-08 DIAGNOSIS — Z23 Encounter for immunization: Secondary | ICD-10-CM | POA: Diagnosis not present

## 2015-06-08 DIAGNOSIS — C7931 Secondary malignant neoplasm of brain: Secondary | ICD-10-CM | POA: Diagnosis not present

## 2015-06-08 DIAGNOSIS — Z5111 Encounter for antineoplastic chemotherapy: Secondary | ICD-10-CM

## 2015-06-08 LAB — COMPREHENSIVE METABOLIC PANEL (CC13)
ALT: 12 U/L (ref 0–55)
AST: 11 U/L (ref 5–34)
Albumin: 3.7 g/dL (ref 3.5–5.0)
Alkaline Phosphatase: 82 U/L (ref 40–150)
Anion Gap: 10 mEq/L (ref 3–11)
BUN: 11.6 mg/dL (ref 7.0–26.0)
CO2: 27 mEq/L (ref 22–29)
Calcium: 9.7 mg/dL (ref 8.4–10.4)
Chloride: 105 mEq/L (ref 98–109)
Creatinine: 0.8 mg/dL (ref 0.6–1.1)
EGFR: 84 mL/min/{1.73_m2} — ABNORMAL LOW (ref >90)
Glucose: 114 mg/dl (ref 70–140)
Potassium: 3.6 mEq/L (ref 3.5–5.1)
Sodium: 142 mEq/L (ref 136–145)
Total Bilirubin: 0.43 mg/dL (ref 0.20–1.20)
Total Protein: 6.8 g/dL (ref 6.4–8.3)

## 2015-06-08 LAB — CBC WITH DIFFERENTIAL/PLATELET
BASO%: 0.3 % (ref 0.0–2.0)
Basophils Absolute: 0 10*3/uL (ref 0.0–0.1)
EOS%: 0.1 % (ref 0.0–7.0)
Eosinophils Absolute: 0 10*3/uL (ref 0.0–0.5)
HCT: 37 % (ref 34.8–46.6)
HGB: 12.3 g/dL (ref 11.6–15.9)
LYMPH%: 4.7 % — ABNORMAL LOW (ref 14.0–49.7)
MCH: 32.9 pg (ref 25.1–34.0)
MCHC: 33.2 g/dL (ref 31.5–36.0)
MCV: 99.1 fL (ref 79.5–101.0)
MONO#: 0.6 10*3/uL (ref 0.1–0.9)
MONO%: 7.3 % (ref 0.0–14.0)
NEUT#: 6.9 10*3/uL — ABNORMAL HIGH (ref 1.5–6.5)
NEUT%: 87.6 % — ABNORMAL HIGH (ref 38.4–76.8)
Platelets: 300 10*3/uL (ref 145–400)
RBC: 3.74 10*6/uL (ref 3.70–5.45)
RDW: 14.8 % — ABNORMAL HIGH (ref 11.2–14.5)
WBC: 7.9 10*3/uL (ref 3.9–10.3)
lymph#: 0.4 10*3/uL — ABNORMAL LOW (ref 0.9–3.3)

## 2015-06-08 MED ORDER — SODIUM CHLORIDE 0.9 % IV SOLN
Freq: Once | INTRAVENOUS | Status: AC
  Administered 2015-06-08: 10:00:00 via INTRAVENOUS

## 2015-06-08 MED ORDER — CYANOCOBALAMIN 1000 MCG/ML IJ SOLN
INTRAMUSCULAR | Status: AC
  Filled 2015-06-08: qty 1

## 2015-06-08 MED ORDER — INFLUENZA VAC SPLIT QUAD 0.5 ML IM SUSY
0.5000 mL | PREFILLED_SYRINGE | Freq: Once | INTRAMUSCULAR | Status: AC
  Administered 2015-06-08: 0.5 mL via INTRAMUSCULAR
  Filled 2015-06-08: qty 0.5

## 2015-06-08 MED ORDER — SODIUM CHLORIDE 0.9 % IV SOLN
Freq: Once | INTRAVENOUS | Status: AC
  Administered 2015-06-08: 10:00:00 via INTRAVENOUS
  Filled 2015-06-08: qty 4

## 2015-06-08 MED ORDER — SODIUM CHLORIDE 0.9 % IV SOLN
500.0000 mg/m2 | Freq: Once | INTRAVENOUS | Status: AC
  Administered 2015-06-08: 800 mg via INTRAVENOUS
  Filled 2015-06-08: qty 28

## 2015-06-08 MED ORDER — CYANOCOBALAMIN 1000 MCG/ML IJ SOLN
1000.0000 ug | Freq: Once | INTRAMUSCULAR | Status: AC
  Administered 2015-06-08: 1000 ug via INTRAMUSCULAR

## 2015-06-08 NOTE — Patient Instructions (Signed)
[facility] Discharge Instructions for Patients Receiving Chemotherapy  Today you received the following chemotherapy agents: Alimta  To help prevent nausea and vomiting after your treatment, we encourage you to take your nausea medication.  Take it as often as prescribed.     If you develop nausea and vomiting that is not controlled by your nausea medication, call the clinic. If it is after clinic hours your family physician or the after hours number for the clinic or go to the Emergency Department.   BELOW ARE SYMPTOMS THAT SHOULD BE REPORTED IMMEDIATELY:  *FEVER GREATER THAN 100.5 F  *CHILLS WITH OR WITHOUT FEVER  NAUSEA AND VOMITING THAT IS NOT CONTROLLED WITH YOUR NAUSEA MEDICATION  *UNUSUAL SHORTNESS OF BREATH  *UNUSUAL BRUISING OR BLEEDING  TENDERNESS IN MOUTH AND THROAT WITH OR WITHOUT PRESENCE OF ULCERS  *URINARY PROBLEMS  *BOWEL PROBLEMS  UNUSUAL RASH Items with * indicate a potential emergency and should be followed up as soon as possible.  Feel free to call the clinic you have any questions or concerns. The clinic phone number is [phone].   I have been informed and understand all the instructions given to me. I know to contact the clinic, my physician, or go to the Emergency Department if any problems should occur. I do not have any questions at this time, but understand that I may call the clinic during office hours   should I have any questions or need assistance in obtaining follow up care.    __________________________________________  _____________  __________ Signature of Patient or Authorized Representative            Date                   Time    __________________________________________ Nurse's [facility] Discharge Instructions for Patients Receiving Chemotherapy  Today you received the following chemotherapy agents:   To help prevent nausea and vomiting after your treatment, we encourage you to  take your nausea medication.  Take it as often as prescribed.     If you develop nausea and vomiting that is not controlled by your nausea medication, call the clinic. If it is after clinic hours your family physician or the after hours number for the clinic or go to the Emergency Department.   BELOW ARE SYMPTOMS THAT SHOULD BE REPORTED IMMEDIATELY:  *FEVER GREATER THAN 100.5 F  *CHILLS WITH OR WITHOUT FEVER  NAUSEA AND VOMITING THAT IS NOT CONTROLLED WITH YOUR NAUSEA MEDICATION  *UNUSUAL SHORTNESS OF BREATH  *UNUSUAL BRUISING OR BLEEDING  TENDERNESS IN MOUTH AND THROAT WITH OR WITHOUT PRESENCE OF ULCERS  *URINARY PROBLEMS  *BOWEL PROBLEMS  UNUSUAL RASH Items with * indicate a potential emergency and should be followed up as soon as possible.  Feel free to call the clinic you have any questions or concerns. The clinic phone number is [phone].   I have been informed and understand all the instructions given to me. I know to contact the clinic, my physician, or go to the Emergency Department if any problems should occur. I do not have any questions at this time, but understand that I may call the clinic during office hours   should I have any questions or need assistance in obtaining follow up care.    __________________________________________  _____________  __________ Signature of Patient or Authorized Representative            Date  Time    __________________________________________ Nurse's Signature

## 2015-06-08 NOTE — Progress Notes (Signed)
Alexis [name]   Fax:(336) [place], [place] [address] [place]  [phone]  DIAGNOSIS: Stage IV (T2a, N0, M1b) non-small cell lung cancer consistent with adenocarcinoma with negative EGFR mutation and negative ALK gene translocation diagnosed in January of 2015 presented with right upper lobe lung mass in addition to a solitary brain metastasis status post stereotactic to a solitary brain metastasis as well as radiotherapy to the right upper lobe lung mass.  PRIOR THERAPY:  1) Status post stereotactic radiotherapy to a solitary right parietal brain lesion under the care of Dr. [name] on 10/16/2013. 2) Status post palliative radiotherapy to the right lung tumor under the care of Dr. [name] completed on 12/05/2013. 3) Systemic chemotherapy with carboplatin for AUC of 5 and Alimta 500 mg/M2 every 3 weeks. First dose Jan 06 2014. Status post 6 cycles.  CURRENT THERAPY: Systemic chemotherapy with maintenance Alimta 500 mg/M2 every 3 weeks. Status post 16 cycles.  INTERVAL HISTORY: Alexis [name] 64 y.o. female returns to the clinic today for followup visit accompanied by her husband. The patient is feeling fine today. She status post 16 cycles of maintenance chemotherapy. She is tolerating her maintenance chemotherapy with Alimta fairly well. She denied having any significant chest pain, shortness of breath, cough or hemoptysis. She has no fever or chills, no nausea or vomiting. She denied having any significant weight loss or night sweats. Patient would like a flu shot today.   MEDICAL HISTORY: Past Medical History  Diagnosis Date  . Ileus, postoperative 11/18/2013  . Physical deconditioning 11/18/2013  . Severe protein-calorie malnutrition ([place]) 11/18/2013  . Anemia 11/18/2013  . History of radiation therapy 10-28-13- 12-05-13    lung ca 50 Gy/57f  . Anxiety   . Hx of radiation therapy 10/16/13    rt parietal brain/20Gy    . 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  MEDICATIONS:  Current Outpatient Prescriptions  Medication Sig Dispense Refill  . acetaminophen (TYLENOL) 500 MG tablet Take 500 mg by mouth every 6 (six) hours as needed for mild pain or headache.    . ALPRAZolam (XANAX) 0.25 MG tablet Take 1 tablet (0.25 mg total) by mouth 3 (three) times daily as needed. for anxiety 45 tablet 0  . Ascorbic Acid (VITAMIN C GUMMIE PO) Take 1 each by mouth every morning.    .Marland Kitchendexamethasone (DECADRON) 4 MG tablet 4 mg by mouth twice a day the day before, day of and day after the chemotherapy every 3 weeks 40 tablet 1  . folic acid (FOLVITE) 1 MG tablet Take 1 tablet (1 mg total) by mouth daily. 30 tablet 2  . Multiple Vitamin (MULTIVITAMIN WITH MINERALS) TABS tablet Take 1 tablet by mouth every morning.    . ondansetron (ZOFRAN) 8 MG tablet Take 1 tablet (8 mg total) by mouth every 8 (eight) hours as needed for nausea or vomiting. 30 tablet 0  . OVER THE COUNTER MEDICATION Take 1 tablet by mouth every morning. (Vitamin A)    . pentoxifylline (TRENTAL) 400 MG CR tablet Take 1 tablet (400 mg total) by mouth 2 (two) times daily. Patient is also to take vitamin E 400 mg BID with trental. 60 tablet 3  . prochlorperazine (COMPAZINE) 10 MG tablet Take 1 tablet (10 mg total) by mouth every 6 (six) hours  as needed for nausea or vomiting. 60 tablet 0  . Zinc 50 MG TABS Take 1 tablet by mouth every morning.     Current Facility-Administered Medications  Medication Dose Route Frequency Provider Last Rate Last Dose  . Influenza vac split quadrivalent PF (FLUARIX) injection 0.5 mL  0.5 mL Intramuscular Once Maryanna Shape, NP        SURGICAL HISTORY:  Past Surgical History  Procedure Laterality Date  . Video bronchoscopy Bilateral 08/30/2013    Procedure: VIDEO BRONCHOSCOPY WITH FLUORO;  Surgeon: Tanda Rockers, MD;  Location: [facility]  END[facility];  Service: Cardiopulmonary;  Laterality: Bilateral;  . Abdominal hysterectomy    . Laparotomy N/A 11/03/2013    Procedure: EXPLORATORY LAPAROTOMY, DRAINAGE OF INTRA  ABDOMINAL ABSCESSES, MOBILIZATION OF SPLENIC FLEXURE, SIGMOID COLECTOMY WITH COLOSTOMY;  Surgeon: Odis Hollingshead, MD;  Location: [facility];  Service: General;  Laterality: N/A;  . Colostomy takedown N/A 07/10/2014    Procedure: LAPAROSCOPIC LYSIS OF ADHESIONS (90 MIN) LAPAROSCOPIC ASSISTED COLOSTOMY CLOSURE, RIGID PROCTOSIGMOIDOSCOPY;  Surgeon: Jackolyn Confer, MD;  Location: [facility];  Service: General;  Laterality: N/A;    REVIEW OF SYSTEMS:  Constitutional: negative Eyes: negative Ears, nose, mouth, throat, and face: negative Respiratory: negative Cardiovascular: negative Gastrointestinal: negative Genitourinary:negative Integument/breast: negative Hematologic/lymphatic: negative Musculoskeletal:negative Neurological: negative Behavioral/Psych: negative Endocrine: negative Allergic/Immunologic: negative   PHYSICAL EXAMINATION: General appearance: alert, cooperative, fatigued and no distress Head: Normocephalic, without obvious abnormality, atraumatic Neck: no adenopathy, no JVD, supple, symmetrical, trachea midline and thyroid not enlarged, symmetric, no tenderness/mass/nodules Lymph nodes: Cervical, supraclavicular, and axillary nodes normal. Resp: clear to auscultation bilaterally Back: symmetric, no curvature. ROM normal. No CVA tenderness. Cardio: regular rate and rhythm, S1, S2 normal, no murmur, click, rub or gallop GI: soft, non-tender; bowel sounds normal; no masses,  no organomegaly Extremities: extremities normal, atraumatic, no cyanosis or edema Neurologic: Alert and oriented X 3, normal strength and tone. Normal symmetric reflexes. Normal coordination and gait  ECOG PERFORMANCE STATUS: 0 - Asymptomatic  Blood pressure 104/34, pulse 64, temperature 98.2 F (36.8 C), temperature source Oral, resp.  rate 18, height _0  (1.626 m), weight 147 lb 9.6 oz (66.951 kg), SpO2 100 %.  LABORATORY DATA: Lab Results  Component Value Date   WBC 7.9 06/08/2015   HGB 12.3 06/08/2015   HCT 37.0 06/08/2015   MCV 99.1 06/08/2015   PLT 300 06/08/2015      Chemistry      Component Value Date/Time   NA 141 05/18/2015 0911   NA 139 07/12/2014 0512   K 4.0 05/18/2015 0911   K 3.9 07/12/2014 0512   CL 100 07/12/2014 0512   CO2 24 05/18/2015 0911   CO2 32 07/12/2014 0512   BUN 15.1 05/18/2015 0911   BUN 5* 07/12/2014 0512   CREATININE 0.8 05/18/2015 0911   CREATININE 0.55 07/12/2014 0512      Component Value Date/Time   CALCIUM 9.5 05/18/2015 0911   CALCIUM 9.0 07/12/2014 0512   ALKPHOS 86 05/18/2015 0911   ALKPHOS 91 07/10/2014 0955   AST 12 05/18/2015 0911   AST 15 07/10/2014 0955   ALT 10 05/18/2015 0911   ALT 8 07/10/2014 0955   BILITOT 0.34 05/18/2015 0911   BILITOT 0.4 07/10/2014 0955       RADIOGRAPHIC STUDIES: No results found.  ASSESSMENT AND PLAN: This is a very pleasant 64 year old white female recently diagnosed with a stage IV non-small cell lung cancer, adenocarcinoma with negative EGFR mutation and negative ALK  gene translocation. The patient is status post stereotactic radiotherapy to a solitary brain metastases in addition to palliative radiotherapy to the right upper lobe lung mass. She status post 6 cycles of systemic chemotherapy with carboplatin and Alimta with improvement in her disease and she is currently undergoing maintenance chemotherapy with single agent Alimta status post 16 cycles and tolerating her treatment fairly well. Her recent CT scan of the chest, abdomen pelvis showed no evidence for disease progression.  Patient was seen with Dr. Julien Nordmann today. Recommended for her to continue her current maintenance treatment with single agent Alimta 500 mg/M2 every 3 weeks. She will proceed with cycle #17 today as scheduled.  For anxiety, she is currently on  Xanax 0.25 mg by mouth 3 times a day as needed. She will continue this medication.  Flu vaccine will be administered today.   She will come back for follow-up visit in 3 weeks for reevaluation before proceeding with cycle #18.  The patient was advised to call immediately if she has any concerning symptoms in the interval. The patient voices understanding of current disease status and treatment options and is in agreement with the current care plan.  All questions were answered. The patient knows to call the clinic with any problems, questions or concerns. We can certainly see the patient much sooner if necessary.  Mikey Bussing, DNP, AGPCNP-BC, AOCNP  ADDENDUM: Hematology/Oncology Attending: I had a face to face encounter with the patient today. I recommended her care plan. This is a very pleasant 63 years old white female with metastatic non-small cell lung cancer, adenocarcinoma currently undergoing maintenance chemotherapy with single agent Alimta status post 16 cycles. The patient is tolerating her treatment well. She denied having any significant nausea or vomiting, no fever or chills. I recommended for her to proceed with her maintenance treatment as scheduled. She will receive cycle #17 today. The patient will receive flu shot today. She will come back for follow-up visit in 3 weeks for reevaluation with the start of cycle #18. She was advised to call immediately if she has any concerning symptoms in the interval.  Disclaimer: This note was dictated with voice recognition software. Similar sounding words can inadvertently be transcribed and may be missed upon review. Eilleen Kempf., MD 06/08/2015

## 2015-06-11 ENCOUNTER — Telehealth: Payer: Self-pay | Admitting: *Deleted

## 2015-06-11 DIAGNOSIS — C3411 Malignant neoplasm of upper lobe, right bronchus or lung: Secondary | ICD-10-CM

## 2015-06-11 MED ORDER — SENNOSIDES-DOCUSATE SODIUM 8.6-50 MG PO TABS: 1.0000 | ORAL_TABLET | Freq: Every day | ORAL | Status: AC

## 2015-06-11 MED ORDER — OMEPRAZOLE 20 MG PO CPDR: 20.0000 mg | DELAYED_RELEASE_CAPSULE | Freq: Every day | ORAL | Status: DC

## 2015-06-11 NOTE — Telephone Encounter (Signed)
"  I'm having bad indigestion.  I started Tums yesterday because whenever I eat, I have a boiling hot water feeling coming up my esophagus.  My bowels move but are hard."  Dr. Julien Nordmann notified.  Verbal order received and read back from Dr. Julien Nordmann for prilosec OTC for indigestion, senokot-s or Gwendolyn Lima OTC for bowels.  Order given to [facility] at this time.

## 2015-06-11 NOTE — Addendum Note (Signed)
Addended by: Cherylynn Ridges on: 06/11/2015 11:34 AM   Modules accepted: Orders

## 2015-06-24 ENCOUNTER — Other Ambulatory Visit: Payer: Self-pay | Admitting: Radiation Oncology

## 2015-06-26 ENCOUNTER — Other Ambulatory Visit: Payer: Self-pay | Admitting: *Deleted

## 2015-06-26 DIAGNOSIS — C7931 Secondary malignant neoplasm of brain: Secondary | ICD-10-CM

## 2015-06-26 DIAGNOSIS — C7949 Secondary malignant neoplasm of other parts of nervous system: Secondary | ICD-10-CM

## 2015-06-26 DIAGNOSIS — C3411 Malignant neoplasm of upper lobe, right bronchus or lung: Secondary | ICD-10-CM

## 2015-06-29 ENCOUNTER — Encounter: Payer: Self-pay | Admitting: Internal Medicine

## 2015-06-29 ENCOUNTER — Encounter: Payer: Self-pay | Admitting: Radiation Therapy

## 2015-06-29 ENCOUNTER — Other Ambulatory Visit (HOSPITAL_BASED_OUTPATIENT_CLINIC_OR_DEPARTMENT_OTHER): Payer: BLUE CROSS/BLUE SHIELD

## 2015-06-29 ENCOUNTER — Ambulatory Visit (HOSPITAL_BASED_OUTPATIENT_CLINIC_OR_DEPARTMENT_OTHER): Payer: BLUE CROSS/BLUE SHIELD | Admitting: Internal Medicine

## 2015-06-29 ENCOUNTER — Telehealth: Payer: Self-pay | Admitting: Internal Medicine

## 2015-06-29 ENCOUNTER — Telehealth: Payer: Self-pay | Admitting: Medical Oncology

## 2015-06-29 ENCOUNTER — Encounter: Payer: Self-pay | Admitting: *Deleted

## 2015-06-29 ENCOUNTER — Ambulatory Visit (HOSPITAL_BASED_OUTPATIENT_CLINIC_OR_DEPARTMENT_OTHER): Payer: BLUE CROSS/BLUE SHIELD

## 2015-06-29 ENCOUNTER — Other Ambulatory Visit: Payer: Self-pay | Admitting: Radiation Therapy

## 2015-06-29 VITALS — BP 100/49 | HR 66 | Temp 97.8°F | Resp 20 | Ht 64.0 in | Wt 152.4 lb

## 2015-06-29 DIAGNOSIS — C7949 Secondary malignant neoplasm of other parts of nervous system: Secondary | ICD-10-CM

## 2015-06-29 DIAGNOSIS — C7931 Secondary malignant neoplasm of brain: Secondary | ICD-10-CM

## 2015-06-29 DIAGNOSIS — F411 Generalized anxiety disorder: Secondary | ICD-10-CM

## 2015-06-29 DIAGNOSIS — F419 Anxiety disorder, unspecified: Secondary | ICD-10-CM | POA: Diagnosis not present

## 2015-06-29 DIAGNOSIS — Z5111 Encounter for antineoplastic chemotherapy: Secondary | ICD-10-CM

## 2015-06-29 DIAGNOSIS — F329 Major depressive disorder, single episode, unspecified: Secondary | ICD-10-CM | POA: Diagnosis not present

## 2015-06-29 DIAGNOSIS — C3411 Malignant neoplasm of upper lobe, right bronchus or lung: Secondary | ICD-10-CM

## 2015-06-29 LAB — COMPREHENSIVE METABOLIC PANEL (CC13)
ALT: 16 U/L (ref 0–55)
AST: 15 U/L (ref 5–34)
Albumin: 3.7 g/dL (ref 3.5–5.0)
Alkaline Phosphatase: 93 U/L (ref 40–150)
Anion Gap: 9 mEq/L (ref 3–11)
BUN: 17.3 mg/dL (ref 7.0–26.0)
CO2: 24 mEq/L (ref 22–29)
Calcium: 9.7 mg/dL (ref 8.4–10.4)
Chloride: 106 mEq/L (ref 98–109)
Creatinine: 0.8 mg/dL (ref 0.6–1.1)
EGFR: 82 mL/min/{1.73_m2} — ABNORMAL LOW (ref >90)
Glucose: 129 mg/dl (ref 70–140)
Potassium: 4.6 mEq/L (ref 3.5–5.1)
Sodium: 139 mEq/L (ref 136–145)
Total Bilirubin: 0.3 mg/dL (ref 0.20–1.20)
Total Protein: 7.1 g/dL (ref 6.4–8.3)

## 2015-06-29 LAB — CBC WITH DIFFERENTIAL/PLATELET
BASO%: 0.2 % (ref 0.0–2.0)
Basophils Absolute: 0 10*3/uL (ref 0.0–0.1)
EOS%: 0 % (ref 0.0–7.0)
Eosinophils Absolute: 0 10*3/uL (ref 0.0–0.5)
HCT: 37 % (ref 34.8–46.6)
HGB: 12.4 g/dL (ref 11.6–15.9)
LYMPH%: 2.7 % — ABNORMAL LOW (ref 14.0–49.7)
MCH: 33.2 pg (ref 25.1–34.0)
MCHC: 33.4 g/dL (ref 31.5–36.0)
MCV: 99.5 fL (ref 79.5–101.0)
MONO#: 0.5 10*3/uL (ref 0.1–0.9)
MONO%: 4.4 % (ref 0.0–14.0)
NEUT#: 9.5 10*3/uL — ABNORMAL HIGH (ref 1.5–6.5)
NEUT%: 92.7 % — ABNORMAL HIGH (ref 38.4–76.8)
Platelets: 273 10*3/uL (ref 145–400)
RBC: 3.72 10*6/uL (ref 3.70–5.45)
RDW: 14.4 % (ref 11.2–14.5)
WBC: 10.3 10*3/uL (ref 3.9–10.3)
lymph#: 0.3 10*3/uL — ABNORMAL LOW (ref 0.9–3.3)

## 2015-06-29 MED ORDER — ALPRAZOLAM 0.25 MG PO TABS: 0.2500 mg | ORAL_TABLET | Freq: Three times a day (TID) | ORAL | Status: DC | PRN

## 2015-06-29 MED ORDER — SODIUM CHLORIDE 0.9 % IV SOLN
500.0000 mg/m2 | Freq: Once | INTRAVENOUS | Status: AC
  Administered 2015-06-29: 800 mg via INTRAVENOUS
  Filled 2015-06-29: qty 32

## 2015-06-29 MED ORDER — SODIUM CHLORIDE 0.9 % IV SOLN
Freq: Once | INTRAVENOUS | Status: AC
  Administered 2015-06-29: 10:00:00 via INTRAVENOUS
  Filled 2015-06-29: qty 4

## 2015-06-29 MED ORDER — SODIUM CHLORIDE 0.9 % IV SOLN
Freq: Once | INTRAVENOUS | Status: AC
  Administered 2015-06-29: 10:00:00 via INTRAVENOUS

## 2015-06-29 MED ORDER — FOLIC ACID 1 MG PO TABS: 1.0000 mg | ORAL_TABLET | Freq: Every day | ORAL | Status: DC

## 2015-06-29 NOTE — Progress Notes (Signed)
[facility] [phone]   Fax:(336) [place], MD [address] [place] [phone]  DIAGNOSIS: Stage IV (T2a, N0, M1b) non-small cell lung cancer consistent with adenocarcinoma with negative EGFR mutation and negative ALK gene translocation diagnosed in January of 2015 presented with right upper lobe lung mass in addition to a solitary brain metastasis status post stereotactic to a solitary brain metastasis as well as radiotherapy to the right upper lobe lung mass.  PRIOR THERAPY:  1) Status post stereotactic radiotherapy to a solitary right parietal brain lesion under the care of Dr. [name] on 10/16/2013. 2) Status post palliative radiotherapy to the right lung tumor under the care of Dr. [name] completed on 12/05/2013. 3) Systemic chemotherapy with carboplatin for AUC of 5 and Alimta 500 mg/M2 every 3 weeks. First dose Jan 06 2014. Status post 6 cycles.  CURRENT THERAPY: Systemic chemotherapy with maintenance Alimta 500 mg/M2 every 3 weeks. Status post 18 cycles.  INTERVAL HISTORY: Alexis [name] 64 y.o. female returns to the clinic today for followup visit accompanied by her husband. The patient is feeling fine today. She status post 18 cycles of maintenance chemotherapy. She is tolerating her maintenance chemotherapy with Alimta fairly well. She denied having any significant chest pain, shortness of breath, cough or hemoptysis. She has no fever or chills, no nausea or vomiting. She denied having any significant weight loss or night sweats. He is here today to start cycle #19 of her treatment.   MEDICAL HISTORY: Past Medical History  Diagnosis Date  . Ileus, postoperative 11/18/2013  . Physical deconditioning 11/18/2013  . Severe protein-calorie malnutrition ([place]) 11/18/2013  . Anemia 11/18/2013  . History of radiation therapy 10-28-13- 12-05-13    lung ca 50 Gy/67f  . Anxiety   . Hx of radiation therapy 10/16/13    rt  parietal brain/20Gy  . 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  MEDICATIONS:  Current Outpatient Prescriptions  Medication Sig Dispense Refill  . acetaminophen (TYLENOL) 500 MG tablet Take 500 mg by mouth every 6 (six) hours as needed for mild pain or headache.    . ALPRAZolam (XANAX) 0.25 MG tablet Take 1 tablet (0.25 mg total) by mouth 3 (three) times daily as needed. for anxiety 45 tablet 0  . Ascorbic Acid (VITAMIN C GUMMIE PO) Take 1 each by mouth every morning.    .Marland Kitchendexamethasone (DECADRON) 4 MG tablet 4 mg by mouth twice a day the day before, day of and day after the chemotherapy every 3 weeks 40 tablet 1  . folic acid (FOLVITE) 1 MG tablet Take 1 tablet (1 mg total) by mouth daily. 30 tablet 2  . Multiple Vitamin (MULTIVITAMIN WITH MINERALS) TABS tablet Take 1 tablet by mouth every morning.    .Marland Kitchenomeprazole (PRILOSEC) 20 MG capsule Take 1 capsule (20 mg total) by mouth daily. As needed for reflux or indigestion. 42 capsule PRN  . ondansetron (ZOFRAN) 8 MG tablet Take 1 tablet (8 mg total) by mouth every 8 (eight) hours as needed for nausea or vomiting. 30 tablet 0  . OVER THE COUNTER MEDICATION Take 1 tablet by mouth every morning. (Vitamin A)    . pentoxifylline (TRENTAL) 400 MG CR tablet Take 1 tablet (400 mg total) by mouth 2 (two) times daily. Patient is also to take vitamin  E 400 mg BID with trental. 60 tablet 3  . pentoxifylline (TRENTAL) 400 MG CR tablet TAKE ONE TABLET BY MOUTH TWICE DAILY. PATIENT IS ALSO TO TAKE VITAMIN E 400 UNITS TWICE DAILY WITH PENTOXIFYLLINE. 60 tablet 0  . prochlorperazine (COMPAZINE) 10 MG tablet Take 1 tablet (10 mg total) by mouth every 6 (six) hours as needed for nausea or vomiting. 60 tablet 0  . senna-docusate (SENOKOT-S) 8.6-50 MG tablet Take 1 tablet by mouth daily. 30 tablet prn  . Zinc 50 MG TABS Take 1 tablet by mouth every morning.     No current  facility-administered medications for this visit.    SURGICAL HISTORY:  Past Surgical History  Procedure Laterality Date  . Video bronchoscopy Bilateral 08/30/2013    Procedure: VIDEO BRONCHOSCOPY WITH FLUORO;  Surgeon: Tanda Rockers, MD;  Location: [facility];  Service: Cardiopulmonary;  Laterality: Bilateral;  . Abdominal hysterectomy    . Laparotomy N/A 11/03/2013    Procedure: EXPLORATORY LAPAROTOMY, DRAINAGE OF INTRA  ABDOMINAL ABSCESSES, MOBILIZATION OF SPLENIC FLEXURE, SIGMOID COLECTOMY WITH COLOSTOMY;  Surgeon: Odis Hollingshead, MD;  Location: [facility];  Service: General;  Laterality: N/A;  . Colostomy takedown N/A 07/10/2014    Procedure: LAPAROSCOPIC LYSIS OF ADHESIONS (90 MIN) LAPAROSCOPIC ASSISTED COLOSTOMY CLOSURE, RIGID PROCTOSIGMOIDOSCOPY;  Surgeon: Jackolyn Confer, MD;  Location: [facility];  Service: General;  Laterality: N/A;    REVIEW OF SYSTEMS:  Constitutional: negative Eyes: negative Ears, nose, mouth, throat, and face: negative Respiratory: negative Cardiovascular: negative Gastrointestinal: negative Genitourinary:negative Integument/breast: negative Hematologic/lymphatic: negative Musculoskeletal:negative Neurological: negative Behavioral/Psych: negative Endocrine: negative Allergic/Immunologic: negative   PHYSICAL EXAMINATION: General appearance: alert, cooperative, fatigued and no distress Head: Normocephalic, without obvious abnormality, atraumatic Neck: no adenopathy, no JVD, supple, symmetrical, trachea midline and thyroid not enlarged, symmetric, no tenderness/mass/nodules Lymph nodes: Cervical, supraclavicular, and axillary nodes normal. Resp: clear to auscultation bilaterally Back: symmetric, no curvature. ROM normal. No CVA tenderness. Cardio: regular rate and rhythm, S1, S2 normal, no murmur, click, rub or gallop GI: soft, non-tender; bowel sounds normal; no masses,  no organomegaly Extremities: extremities normal, atraumatic, no cyanosis or  edema Neurologic: Alert and oriented X 3, normal strength and tone. Normal symmetric reflexes. Normal coordination and gait  ECOG PERFORMANCE STATUS: 0 - Asymptomatic  Blood pressure 100/49, pulse 66, temperature 97.8 F (36.6 C), temperature source Oral, resp. rate 20, height 5' 4"  (1.626 m), weight 152 lb 6.4 oz (69.128 kg), SpO2 100 %.  LABORATORY DATA: Lab Results  Component Value Date   WBC 7.9 06/08/2015   HGB 12.3 06/08/2015   HCT 37.0 06/08/2015   MCV 99.1 06/08/2015   PLT 300 06/08/2015      Chemistry      Component Value Date/Time   NA 142 06/08/2015 0812   NA 139 07/12/2014 0512   K 3.6 06/08/2015 0812   K 3.9 07/12/2014 0512   CL 100 07/12/2014 0512   CO2 27 06/08/2015 0812   CO2 32 07/12/2014 0512   BUN 11.6 06/08/2015 0812   BUN 5* 07/12/2014 0512   CREATININE 0.8 06/08/2015 0812   CREATININE 0.55 07/12/2014 0512      Component Value Date/Time   CALCIUM 9.7 06/08/2015 0812   CALCIUM 9.0 07/12/2014 0512   ALKPHOS 82 06/08/2015 0812   ALKPHOS 91 07/10/2014 0955   AST 11 06/08/2015 0812   AST 15 07/10/2014 0955   ALT 12 06/08/2015 0812   ALT 8 07/10/2014 0955   BILITOT 0.43 06/08/2015 0812   BILITOT 0.4  07/10/2014 0955       RADIOGRAPHIC STUDIES: No results found.  ASSESSMENT AND PLAN: This is a very pleasant 64 years old white female recently diagnosed with a stage IV non-small cell lung cancer, adenocarcinoma with negative EGFR mutation and negative ALK gene translocation. The patient is status post stereotactic radiotherapy to a solitary brain metastases in addition to palliative radiotherapy to the right upper lobe lung mass. She status post 6 cycles of systemic chemotherapy with carboplatin and Alimta with improvement in her disease and she is currently undergoing maintenance chemotherapy with single agent Alimta status post 18 cycles and tolerating her treatment fairly well. I recommended for her to continue her current maintenance treatment with  single agent Alimta 500 mg/M2 every 3 weeks. She will proceed with cycle #19 today as scheduled. For history of depression, the patient on Remeron 30 mg by mouth each bedtime. For anxiety, she is currently on Xanax 0.25 mg by mouth 3 times a day as needed and she was given a refill of her medication.  She will come back for follow-up visit in 3 weeks for reevaluation before proceeding with cycle #20 after repeating CT scan of the chest, abdomen and pelvis for restaging of her disease..  The patient was advised to call immediately if she has any concerning symptoms in the interval. The patient voices understanding of current disease status and treatment options and is in agreement with the current care plan.  All questions were answered. The patient knows to call the clinic with any problems, questions or concerns. We can certainly see the patient much sooner if necessary.  Disclaimer: This note was dictated with voice recognition software. Similar sounding words can inadvertently be transcribed and may not be corrected upon review.

## 2015-06-29 NOTE — Progress Notes (Signed)
1.  Do you need a wheel chair?    no  2. On oxygen? no  3. Have you ever had any surgery in the body part being scanned?  no  4. Have you ever had any surgery on your brain or heart?                     no  5. Have you ever had surgery on your eyes or ears?    no                               6. Do you have a pacemaker or defibrillator?   no  7. Do you have a Neurostimulator?  no  8. Claustrophobic?  no  9. Any risk for metal in eyes?  no 10. Injury by bullet, buckshot, or shrapnel?  no  11. Stent?       no                                                                                                            12. Hx of Cancer?   Yes- Lung cancer with mets to brain                                                                                                           13. Kidney or Liver disease?  nno  14. Hx of Lupus, Rheumatoid Arthritis or Scleroderma?  no  15. IV Antibiotics or long term use of NSAIDS?  no  16. HX of Hypertension?  no  17. Diabetes?  no  18. Allergy to contrast?  no  19. Recent labs.  Drawn 06/29/15- results in [place]

## 2015-06-29 NOTE — Progress Notes (Signed)
Oncology Nurse Navigator Documentation  Oncology Nurse Navigator Flowsheets 06/29/2015  Navigator Encounter Type Treatment/spoke with patient and her husband today during tx.  I asked how they were doing.  I listened as she explained.  No barriers identified at this time.   Patient Visit Type Medonc  Treatment Phase Treatment  Barriers/Navigation Needs No barriers at this time  Time Spent with Patient 15

## 2015-06-29 NOTE — Telephone Encounter (Signed)
Called in xanax 

## 2015-06-29 NOTE — Patient Instructions (Signed)
[facility] Discharge Instructions for Patients Receiving Chemotherapy  Today you received the following chemotherapy agents; Alimta.   To help prevent nausea and vomiting after your treatment, we encourage you to take your nausea medication as directed.    If you develop nausea and vomiting that is not controlled by your nausea medication, call the clinic.   BELOW ARE SYMPTOMS THAT SHOULD BE REPORTED IMMEDIATELY:  *FEVER GREATER THAN 100.5 F  *CHILLS WITH OR WITHOUT FEVER  NAUSEA AND VOMITING THAT IS NOT CONTROLLED WITH YOUR NAUSEA MEDICATION  *UNUSUAL SHORTNESS OF BREATH  *UNUSUAL BRUISING OR BLEEDING  TENDERNESS IN MOUTH AND THROAT WITH OR WITHOUT PRESENCE OF ULCERS  *URINARY PROBLEMS  *BOWEL PROBLEMS  UNUSUAL RASH Items with * indicate a potential emergency and should be followed up as soon as possible.  Feel free to call the clinic you have any questions or concerns. The clinic phone number is [phone].  Please show the CHEMO ALERT CARD at check-in to the Emergency Department and triage nurse.   

## 2015-06-29 NOTE — Telephone Encounter (Signed)
Gave patient avs report and appointments for November and December. [facility] will call re ct - patient aware.

## 2015-07-03 ENCOUNTER — Encounter: Payer: Self-pay | Admitting: Radiation Oncology

## 2015-07-03 NOTE — Progress Notes (Signed)
Paperwork faxed to [facility]. @ [phone], confirmation received 07/03/15, copy and all of paperwork given to Ms. Wilma at front desk for patient to p/u Ardeen Fillers)

## 2015-07-17 ENCOUNTER — Ambulatory Visit (HOSPITAL_COMMUNITY)
Admission: RE | Admit: 2015-07-17 | Discharge: 2015-07-17 | Disposition: A | Discharge status: Home / Self Care | Payer: BLUE CROSS/BLUE SHIELD | Source: Ambulatory Visit | Attending: Internal Medicine | Admitting: Internal Medicine

## 2015-07-17 ENCOUNTER — Ambulatory Visit
Admission: RE | Admit: 2015-07-17 | Discharge: 2015-07-17 | Disposition: A | Discharge status: Home / Self Care | Payer: BLUE CROSS/BLUE SHIELD | Source: Ambulatory Visit | Attending: Radiation Oncology | Admitting: Radiation Oncology

## 2015-07-17 DIAGNOSIS — I700 Atherosclerosis of aorta: Secondary | ICD-10-CM | POA: Insufficient documentation

## 2015-07-17 DIAGNOSIS — K429 Umbilical hernia without obstruction or gangrene: Secondary | ICD-10-CM | POA: Diagnosis not present

## 2015-07-17 DIAGNOSIS — J439 Emphysema, unspecified: Secondary | ICD-10-CM | POA: Diagnosis not present

## 2015-07-17 DIAGNOSIS — C3411 Malignant neoplasm of upper lobe, right bronchus or lung: Secondary | ICD-10-CM | POA: Insufficient documentation

## 2015-07-17 DIAGNOSIS — C7931 Secondary malignant neoplasm of brain: Secondary | ICD-10-CM

## 2015-07-17 DIAGNOSIS — E042 Nontoxic multinodular goiter: Secondary | ICD-10-CM | POA: Insufficient documentation

## 2015-07-17 DIAGNOSIS — R918 Other nonspecific abnormal finding of lung field: Secondary | ICD-10-CM | POA: Insufficient documentation

## 2015-07-17 MED ORDER — GADOBENATE DIMEGLUMINE 529 MG/ML IV SOLN
14.0000 mL | Freq: Once | INTRAVENOUS | Status: AC | PRN
  Administered 2015-07-17: 14 mL via INTRAVENOUS

## 2015-07-17 MED ORDER — IOHEXOL 300 MG/ML  SOLN
100.0000 mL | Freq: Once | INTRAMUSCULAR | Status: AC | PRN
  Administered 2015-07-17: 100 mL via INTRAVENOUS

## 2015-07-20 ENCOUNTER — Ambulatory Visit
Admission: RE | Admit: 2015-07-20 | Discharge: 2015-07-20 | Disposition: A | Discharge status: Home / Self Care | Payer: BLUE CROSS/BLUE SHIELD | Source: Ambulatory Visit | Attending: Radiation Oncology | Admitting: Radiation Oncology

## 2015-07-20 ENCOUNTER — Ambulatory Visit (HOSPITAL_BASED_OUTPATIENT_CLINIC_OR_DEPARTMENT_OTHER): Payer: BLUE CROSS/BLUE SHIELD

## 2015-07-20 ENCOUNTER — Telehealth: Payer: Self-pay | Admitting: Internal Medicine

## 2015-07-20 ENCOUNTER — Encounter: Payer: Self-pay | Admitting: *Deleted

## 2015-07-20 ENCOUNTER — Ambulatory Visit (HOSPITAL_BASED_OUTPATIENT_CLINIC_OR_DEPARTMENT_OTHER): Payer: BLUE CROSS/BLUE SHIELD | Admitting: Internal Medicine

## 2015-07-20 ENCOUNTER — Encounter: Payer: Self-pay | Admitting: Internal Medicine

## 2015-07-20 ENCOUNTER — Encounter: Payer: Self-pay | Admitting: Radiation Oncology

## 2015-07-20 ENCOUNTER — Other Ambulatory Visit (HOSPITAL_BASED_OUTPATIENT_CLINIC_OR_DEPARTMENT_OTHER): Payer: BLUE CROSS/BLUE SHIELD

## 2015-07-20 VITALS — BP 108/41 | HR 69 | Temp 98.0°F | Resp 20 | Wt 152.7 lb

## 2015-07-20 VITALS — BP 112/50 | HR 70 | Temp 98.2°F | Resp 18 | Ht 64.0 in | Wt 152.1 lb

## 2015-07-20 DIAGNOSIS — C7931 Secondary malignant neoplasm of brain: Secondary | ICD-10-CM

## 2015-07-20 DIAGNOSIS — C3411 Malignant neoplasm of upper lobe, right bronchus or lung: Secondary | ICD-10-CM

## 2015-07-20 DIAGNOSIS — Z5111 Encounter for antineoplastic chemotherapy: Secondary | ICD-10-CM

## 2015-07-20 DIAGNOSIS — C7949 Secondary malignant neoplasm of other parts of nervous system: Principal | ICD-10-CM

## 2015-07-20 HISTORY — DX: Encounter for antineoplastic chemotherapy: Z51.11

## 2015-07-20 LAB — CBC WITH DIFFERENTIAL/PLATELET
BASO%: 0.6 % (ref 0.0–2.0)
Basophils Absolute: 0 10*3/uL (ref 0.0–0.1)
EOS%: 0 % (ref 0.0–7.0)
Eosinophils Absolute: 0 10*3/uL (ref 0.0–0.5)
HCT: 36.9 % (ref 34.8–46.6)
HGB: 12.3 g/dL (ref 11.6–15.9)
LYMPH%: 4.1 % — ABNORMAL LOW (ref 14.0–49.7)
MCH: 33.1 pg (ref 25.1–34.0)
MCHC: 33.4 g/dL (ref 31.5–36.0)
MCV: 99.1 fL (ref 79.5–101.0)
MONO#: 0.3 10*3/uL (ref 0.1–0.9)
MONO%: 4.7 % (ref 0.0–14.0)
NEUT#: 6.5 10*3/uL (ref 1.5–6.5)
NEUT%: 90.6 % — ABNORMAL HIGH (ref 38.4–76.8)
Platelets: 312 10*3/uL (ref 145–400)
RBC: 3.73 10*6/uL (ref 3.70–5.45)
RDW: 13.6 % (ref 11.2–14.5)
WBC: 7.1 10*3/uL (ref 3.9–10.3)
lymph#: 0.3 10*3/uL — ABNORMAL LOW (ref 0.9–3.3)

## 2015-07-20 LAB — COMPREHENSIVE METABOLIC PANEL (CC13)
ALT: 11 U/L (ref 0–55)
AST: 11 U/L (ref 5–34)
Albumin: 3.7 g/dL (ref 3.5–5.0)
Alkaline Phosphatase: 94 U/L (ref 40–150)
Anion Gap: 11 mEq/L (ref 3–11)
BUN: 12.2 mg/dL (ref 7.0–26.0)
CO2: 26 mEq/L (ref 22–29)
Calcium: 9.7 mg/dL (ref 8.4–10.4)
Chloride: 105 mEq/L (ref 98–109)
Creatinine: 0.8 mg/dL (ref 0.6–1.1)
EGFR: 83 mL/min/{1.73_m2} — ABNORMAL LOW (ref >90)
Glucose: 116 mg/dl (ref 70–140)
Potassium: 3.8 mEq/L (ref 3.5–5.1)
Sodium: 142 mEq/L (ref 136–145)
Total Bilirubin: 0.33 mg/dL (ref 0.20–1.20)
Total Protein: 7 g/dL (ref 6.4–8.3)

## 2015-07-20 MED ORDER — SODIUM CHLORIDE 0.9 % IV SOLN
Freq: Once | INTRAVENOUS | Status: AC
  Administered 2015-07-20: 12:00:00 via INTRAVENOUS
  Filled 2015-07-20: qty 4

## 2015-07-20 MED ORDER — SODIUM CHLORIDE 0.9 % IV SOLN
Freq: Once | INTRAVENOUS | Status: AC
  Administered 2015-07-20: 11:00:00 via INTRAVENOUS

## 2015-07-20 MED ORDER — SODIUM CHLORIDE 0.9 % IV SOLN
500.0000 mg/m2 | Freq: Once | INTRAVENOUS | Status: AC
  Administered 2015-07-20: 800 mg via INTRAVENOUS
  Filled 2015-07-20: qty 32

## 2015-07-20 NOTE — Progress Notes (Signed)
Oncology Nurse Navigator Documentation  Oncology Nurse Navigator Flowsheets 07/20/2015  Navigator Encounter Type Clinic/MDC/spoke with patient and husband today.  She received scan results today from Dr. Julien Nordmann.  She is very excited about her results.  No barriers identified at this time.   Patient Visit Type Follow-up  Treatment Phase Treatment  Barriers/Navigation Needs No barriers at this time  Support Groups/Services -  Time Spent with Patient 15

## 2015-07-20 NOTE — Progress Notes (Signed)
Radiation Oncology         [phone] ________________________________  Name: CLOVIS WARWICK MRN: [id number]  Date: 07/20/2015  DOB: [birth year]  Follow-Up Visit Note  CC: Alexis Roers, MD  Alexis [name], *  Diagnosis:   Metastatic lung cancer with brain metastasis  Interval Since Last Radiation:  Radiosurgery to a right parietal target completed on 10/16/2013   Narrative:  Follow up s/p SRS Brain, here for MRI results, No c/o nausea No hearing loss, no dizzy ness or light headed,no pain, Also to have chemothrapy infusion today, appetite good, no fatigue.  States she feels well.   Still taking Trental/Vitamin E.  ALLERGIES:  is allergic to codeine.  Meds: Current Outpatient Prescriptions  Medication Sig Dispense Refill  . acetaminophen (TYLENOL) 500 MG tablet Take 500 mg by mouth every 6 (six) hours as needed for mild pain or headache.    . ALPRAZolam (XANAX) 0.25 MG tablet Take 1 tablet (0.25 mg total) by mouth 3 (three) times daily as needed. for anxiety 45 tablet 0  . Ascorbic Acid (VITAMIN C GUMMIE PO) Take 1 each by mouth every morning.    Marland Kitchen dexamethasone (DECADRON) 4 MG tablet 4 mg by mouth twice a day the day before, day of and day after the chemotherapy every 3 weeks 40 tablet 1  . folic acid (FOLVITE) 1 MG tablet Take 1 tablet (1 mg total) by mouth daily. 30 tablet 2  . Multiple Vitamin (MULTIVITAMIN WITH MINERALS) TABS tablet Take 1 tablet by mouth every morning.    Marland Kitchen omeprazole (PRILOSEC) 20 MG capsule Take 1 capsule (20 mg total) by mouth daily. As needed for reflux or indigestion. 42 capsule PRN  . ondansetron (ZOFRAN) 8 MG tablet Take 1 tablet (8 mg total) by mouth every 8 (eight) hours as needed for nausea or vomiting. 30 tablet 0  . OVER THE COUNTER MEDICATION Take 1 tablet by mouth every morning. (Vitamin A)    . pentoxifylline (TRENTAL) 400 MG CR tablet Take 1 tablet (400 mg total) by mouth 2 (two) times daily. Patient is also to take vitamin E 400 mg  BID with trental. 60 tablet 3  . pentoxifylline (TRENTAL) 400 MG CR tablet TAKE ONE TABLET BY MOUTH TWICE DAILY. PATIENT IS ALSO TO TAKE VITAMIN E 400 UNITS TWICE DAILY WITH PENTOXIFYLLINE. 60 tablet 0  . Zinc 50 MG TABS Take 1 tablet by mouth every morning.    . prochlorperazine (COMPAZINE) 10 MG tablet Take 1 tablet (10 mg total) by mouth every 6 (six) hours as needed for nausea or vomiting. (Patient not taking: Reported on 07/20/2015) 60 tablet 0  . senna-docusate (SENOKOT-S) 8.6-50 MG tablet Take 1 tablet by mouth daily. (Patient not taking: Reported on 07/20/2015) 30 tablet prn   No current facility-administered medications for this encounter.    Physical Findings: The patient is in no acute distress. Patient is alert and oriented.  weight is 152 lb 11.2 oz (69.264 kg). Her oral temperature is 98 F (36.7 C). Her blood pressure is 108/41 and her pulse is 69. Her respiration is 20 and oxygen saturation is 99%.    Lab Findings: Lab Results  Component Value Date   WBC 7.1 07/20/2015   HGB 12.3 07/20/2015   HCT 36.9 07/20/2015   MCV 99.1 07/20/2015   PLT 312 07/20/2015     Radiographic Findings: Ct Chest W Contrast  07/17/2015  CLINICAL DATA:  Subsequent encounter for lung cancer EXAM: CT CHEST, ABDOMEN, AND PELVIS WITH CONTRAST TECHNIQUE:  Multidetector CT imaging of the chest, abdomen and pelvis was performed following the standard protocol during bolus administration of intravenous contrast. CONTRAST:  140m OMNIPAQUE IOHEXOL 300 MG/ML  SOLN COMPARISON:  04/24/2015. FINDINGS: CT CHEST FINDINGS Mediastinum/Lymph Nodes: There is no axillary lymphadenopathy. No mediastinal lymphadenopathy. Small bilateral thyroid nodules are not substantially changed in the interval. The heart size is normal. No pericardial effusion. The esophagus has normal imaging features. Lungs/Pleura: Biapical pleural-parenchymal scarring again noted. Posterior right apical nodule measures 1.4 x 2.4 cm today  compared to 1.5 x 2.5 cm previously. Previously noted tiny nodule in the anteromedial right upper lobe, anterior to the dominant lesion, is stable. Ill-defined subpleural irregular nodular density in the superior segment right lower lobe (image 21 series 4) measures approximately 1.9 cm in maximum diameter, unchanged in the interval. Changes of emphysema are evident bilaterally with an upper lobe predominance. Musculoskeletal: Bone windows reveal no worrisome lytic or sclerotic osseous lesions. CT ABDOMEN PELVIS FINDINGS Hepatobiliary: No focal abnormality within the liver parenchyma. There is no evidence for gallstones, gallbladder wall thickening, or pericholecystic fluid. No intrahepatic or extrahepatic biliary dilation. Pancreas: No focal mass lesion. No dilatation of the main duct. No intraparenchymal cyst. No peripancreatic edema. Spleen: No splenomegaly. No focal mass lesion. Adrenals/Urinary Tract: No adrenal nodule or mass. No evidence for enhancing lesion in either kidney. No hydronephrosis. No evidence for hydroureter. The urinary bladder appears normal for the degree of distention. Stomach/Bowel: Stomach is nondistended. No gastric wall thickening. No evidence of outlet obstruction. Duodenum is normally positioned as is the ligament of Treitz. No small bowel wall thickening. No small bowel dilatation. The terminal ileum is normal. The appendix is normal. Suture line evident in the distal sigmoid colon with otherwise unremarkable colonic appearance. Vascular/Lymphatic: There is abdominal aortic atherosclerosis without aneurysm. There is no gastrohepatic or hepatoduodenal ligament lymphadenopathy. No intraperitoneal or retroperitoneal lymphadenopy. No pelvic sidewall lymphadenopathy. Reproductive: Uterus is surgically absent. There is no adnexal mass. Other: No intraperitoneal free fluid. Musculoskeletal: Tiny left paraumbilical hernia contains only fat. Bone windows reveal no worrisome lytic or sclerotic  osseous lesions. IMPRESSION: 1. Stable exam. No new or progressive findings in the chest, abdomen, or pelvis. 2. Posterior right apical dominant nodule, in the region of original tumor, it is unchanged. Other ill-defined nodular areas of soft tissue attenuation in the right lung or stable. 3. No evidence for distant metastatic disease. Imaging findings of potential clinical significance: Abdominal aortic atherosclerosis. Emphysema. Electronically Signed   By: EMisty StanleyM.D.   On: 07/17/2015 09:21   Mr BJeri CosWEHContrast  07/17/2015  CLINICAL DATA:  Metastatic lung cancer status post stereotactic radiosurgery completed 09/2013. EXAM: MRI HEAD WITHOUT AND WITH CONTRAST TECHNIQUE: Multiplanar, multiecho pulse sequences of the brain and surrounding structures were obtained without and with intravenous contrast. CONTRAST:  164mMULTIHANCE GADOBENATE DIMEGLUMINE 529 MG/ML IV SOLN COMPARISON:  04/09/2015 FINDINGS: There is no evidence of acute infarct, midline shift, or extra-axial fluid collection. A chronic microhemorrhage at the ventral aspect of the left thalamus is unchanged. Ventricles and sulci are normal for age. Patchy T2 hyperintensities in the deep greater than subcortical cerebral white matter have not significantly changed and are nonspecific but may reflect chronic small vessel ischemic disease. 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 Mildly lobulated, peripherally enhancing lesion in the right parietal lobe is unchanged, measuring 15 x 9 mm (series 10, image 119). A small amount of chronic blood products and mild surrounding white matter  T2 hyperintensity are also unchanged. No new lesions are identified. An incidental right frontal lobe developmental venous anomaly is again noted. Orbits are unremarkable. Opacification of a left anterior ethmoid air cell is unchanged. Mastoid air cells are clear. Small left parietal scalp lesion is unchanged may represent a sebaceous cyst.  Major intracranial vascular flow voids are preserved. IMPRESSION: 1. Unchanged, previously treated right parietal lesion. 2. No evidence of new metastases. Electronically Signed   By: Logan Bores M.D.   On: 07/17/2015 13:18   Ct Abdomen Pelvis W Contrast  07/17/2015  CLINICAL DATA:  Subsequent encounter for lung cancer EXAM: CT CHEST, ABDOMEN, AND PELVIS WITH CONTRAST TECHNIQUE: Multidetector CT imaging of the chest, abdomen and pelvis was performed following the standard protocol during bolus administration of intravenous contrast. CONTRAST:  170m OMNIPAQUE IOHEXOL 300 MG/ML  SOLN COMPARISON:  04/24/2015. FINDINGS: CT CHEST FINDINGS Mediastinum/Lymph Nodes: There is no axillary lymphadenopathy. No mediastinal lymphadenopathy. Small bilateral thyroid nodules are not substantially changed in the interval. The heart size is normal. No pericardial effusion. The esophagus has normal imaging features. Lungs/Pleura: Biapical pleural-parenchymal scarring again noted. Posterior right apical nodule measures 1.4 x 2.4 cm today compared to 1.5 x 2.5 cm previously. Previously noted tiny nodule in the anteromedial right upper lobe, anterior to the dominant lesion, is stable. Ill-defined subpleural irregular nodular density in the superior segment right lower lobe (image 21 series 4) measures approximately 1.9 cm in maximum diameter, unchanged in the interval. Changes of emphysema are evident bilaterally with an upper lobe predominance. Musculoskeletal: Bone windows reveal no worrisome lytic or sclerotic osseous lesions. CT ABDOMEN PELVIS FINDINGS Hepatobiliary: No focal abnormality within the liver parenchyma. There is no evidence for gallstones, gallbladder wall thickening, or pericholecystic fluid. No intrahepatic or extrahepatic biliary dilation. Pancreas: No focal mass lesion. No dilatation of the main duct. No intraparenchymal cyst. No peripancreatic edema. Spleen: No splenomegaly. No focal mass lesion.  Adrenals/Urinary Tract: No adrenal nodule or mass. No evidence for enhancing lesion in either kidney. No hydronephrosis. No evidence for hydroureter. The urinary bladder appears normal for the degree of distention. Stomach/Bowel: Stomach is nondistended. No gastric wall thickening. No evidence of outlet obstruction. Duodenum is normally positioned as is the ligament of Treitz. No small bowel wall thickening. No small bowel dilatation. The terminal ileum is normal. The appendix is normal. Suture line evident in the distal sigmoid colon with otherwise unremarkable colonic appearance. Vascular/Lymphatic: There is abdominal aortic atherosclerosis without aneurysm. There is no gastrohepatic or hepatoduodenal ligament lymphadenopathy. No intraperitoneal or retroperitoneal lymphadenopy. No pelvic sidewall lymphadenopathy. Reproductive: Uterus is surgically absent. There is no adnexal mass. Other: No intraperitoneal free fluid. Musculoskeletal: Tiny left paraumbilical hernia contains only fat. Bone windows reveal no worrisome lytic or sclerotic osseous lesions. IMPRESSION: 1. Stable exam. No new or progressive findings in the chest, abdomen, or pelvis. 2. Posterior right apical dominant nodule, in the region of original tumor, it is unchanged. Other ill-defined nodular areas of soft tissue attenuation in the right lung or stable. 3. No evidence for distant metastatic disease. Imaging findings of potential clinical significance: Abdominal aortic atherosclerosis. Emphysema. Electronically Signed   By: EMisty StanleyM.D.   On: 07/17/2015 09:21   Impression: No sign of progression of the disease on the MRI scan. No new disease seen. Can continue standard operation for brain metastases.   Plan: Brain MRI and follow up in 3 months.  Advised to continue taking Trental/Vitamin E - we may discontinue this in 3 months depending  on the results of her next scan.  ------------------------------------------------  Jodelle Gross, MD, [name]  This document serves as a record of services personally performed by Kyung Rudd, MD. It was created on his behalf by Derek Mound, a trained medical scribe. The creation of this record is based on the scribe's personal observations and the provider's statements to them. This document has been checked and approved by the attending provider.

## 2015-07-20 NOTE — Progress Notes (Signed)
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  PRIOR THERAPY:  1) Status post stereotactic radiotherapy to a solitary right parietal brain lesion under the care of Dr. [name] on 10/16/2013. 2) Status post palliative radiotherapy to the right lung tumor under the care of Dr. [name] completed on 12/05/2013. 3) Systemic chemotherapy with carboplatin for AUC of 5 and Alimta 500 mg/M2 every 3 weeks. First dose Jan 06 2014. Status post 6 cycles.  CURRENT THERAPY: Systemic chemotherapy with maintenance Alimta 500 mg/M2 every 3 weeks. Status post 19 cycles.  INTERVAL HISTORY: Alexis [name] 64 y.o. female returns to the clinic today for 3 weeks followup visit accompanied by her husband. The patient is feeling fine today. She status post 19 cycles of maintenance chemotherapy. She is tolerating her maintenance chemotherapy with Alimta fairly well. She denied having any significant chest pain, shortness of breath, cough or hemoptysis. She has no fever or chills, no nausea or vomiting. She denied having any significant weight loss or night sweats. She had repeat CT scan of the chest, abdomen and pelvis as well as MRI of the brain and she is here today for evaluation and discussion of her scan results before starting cycle #20.   MEDICAL HISTORY: Past Medical History  Diagnosis Date  . Ileus, postoperative 11/18/2013  . Physical deconditioning 11/18/2013  . Severe protein-calorie malnutrition ([place]) 11/18/2013  . Anemia  11/18/2013  . History of radiation therapy 10-28-13- 12-05-13    lung ca 50 Gy/88f  . Anxiety   . Hx of radiation therapy 10/16/13    rt parietal brain/20Gy  . 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  MEDICATIONS:  Current Outpatient Prescriptions  Medication Sig Dispense Refill  . acetaminophen (TYLENOL) 500 MG tablet Take 500 mg by mouth every 6 (six) hours as needed for mild pain or headache.    . ALPRAZolam (XANAX) 0.25 MG tablet Take 1 tablet (0.25 mg total) by mouth 3 (three) times daily as needed. for anxiety 45 tablet 0  . Ascorbic Acid (VITAMIN C GUMMIE PO) Take 1 each by mouth every morning.    .Marland Kitchendexamethasone (DECADRON) 4 MG tablet 4 mg by mouth twice a day the day before, day of and day after the chemotherapy every 3 weeks 40 tablet 1  . folic acid (FOLVITE) 1 MG tablet Take 1 tablet (1 mg total) by mouth daily. 30 tablet 2  . Multiple Vitamin (MULTIVITAMIN WITH MINERALS) TABS tablet Take 1 tablet by mouth every morning.    .Marland Kitchenomeprazole (PRILOSEC) 20 MG capsule Take 1 capsule (20 mg total) by mouth daily. As needed for reflux or indigestion. 42 capsule PRN  . ondansetron (ZOFRAN) 8 MG tablet Take 1 tablet (8 mg total) by mouth every 8 (eight) hours as needed for nausea or vomiting. 30 tablet 0  . OVER THE COUNTER MEDICATION Take 1 tablet by mouth every morning. (Vitamin A)    .  pentoxifylline (TRENTAL) 400 MG CR tablet Take 1 tablet (400 mg total) by mouth 2 (two) times daily. Patient is also to take vitamin E 400 mg BID with trental. 60 tablet 3  . pentoxifylline (TRENTAL) 400 MG CR tablet TAKE ONE TABLET BY MOUTH TWICE DAILY. PATIENT IS ALSO TO TAKE VITAMIN E 400 UNITS TWICE DAILY WITH PENTOXIFYLLINE. 60 tablet 0  . prochlorperazine (COMPAZINE) 10 MG tablet Take 1 tablet (10 mg total) by mouth every 6 (six) hours as needed for nausea or vomiting. 60 tablet 0  . senna-docusate (SENOKOT-S) 8.6-50  MG tablet Take 1 tablet by mouth daily. 30 tablet prn  . Zinc 50 MG TABS Take 1 tablet by mouth every morning.     No current facility-administered medications for this visit.    SURGICAL HISTORY:  Past Surgical History  Procedure Laterality Date  . Video bronchoscopy Bilateral 08/30/2013    Procedure: VIDEO BRONCHOSCOPY WITH FLUORO;  Surgeon: Tanda Rockers, MD;  Location: [facility];  Service: Cardiopulmonary;  Laterality: Bilateral;  . Abdominal hysterectomy    . Laparotomy N/A 11/03/2013    Procedure: EXPLORATORY LAPAROTOMY, DRAINAGE OF INTRA  ABDOMINAL ABSCESSES, MOBILIZATION OF SPLENIC FLEXURE, SIGMOID COLECTOMY WITH COLOSTOMY;  Surgeon: Odis Hollingshead, MD;  Location: [facility];  Service: General;  Laterality: N/A;  . Colostomy takedown N/A 07/10/2014    Procedure: LAPAROSCOPIC LYSIS OF ADHESIONS (90 MIN) LAPAROSCOPIC ASSISTED COLOSTOMY CLOSURE, RIGID PROCTOSIGMOIDOSCOPY;  Surgeon: Jackolyn Confer, MD;  Location: [facility];  Service: General;  Laterality: N/A;    REVIEW OF SYSTEMS:  Constitutional: negative Eyes: negative Ears, nose, mouth, throat, and face: negative Respiratory: negative Cardiovascular: negative Gastrointestinal: negative Genitourinary:negative Integument/breast: negative Hematologic/lymphatic: negative Musculoskeletal:negative Neurological: negative Behavioral/Psych: negative Endocrine: negative Allergic/Immunologic: negative   PHYSICAL EXAMINATION: General appearance: alert, cooperative, fatigued and no distress Head: Normocephalic, without obvious abnormality, atraumatic Neck: no adenopathy, no JVD, supple, symmetrical, trachea midline and thyroid not enlarged, symmetric, no tenderness/mass/nodules Lymph nodes: Cervical, supraclavicular, and axillary nodes normal. Resp: clear to auscultation bilaterally Back: symmetric, no curvature. ROM normal. No CVA tenderness. Cardio: regular rate and rhythm, S1, S2 normal, no murmur, click, rub or gallop GI: soft,  non-tender; bowel sounds normal; no masses,  no organomegaly Extremities: extremities normal, atraumatic, no cyanosis or edema Neurologic: Alert and oriented X 3, normal strength and tone. Normal symmetric reflexes. Normal coordination and gait  ECOG PERFORMANCE STATUS: 0 - Asymptomatic  Blood pressure 112/50, pulse 70, temperature 98.2 F (36.8 C), temperature source Oral, resp. rate 18, height $RemoveBe'5\' 4"'[id number]$  (1.626 m), weight 152 lb 1.6 oz (68.992 kg), SpO2 100 %.  LABORATORY DATA: Lab Results  Component Value Date   WBC 7.1 07/20/2015   HGB 12.3 07/20/2015   HCT 36.9 07/20/2015   MCV 99.1 07/20/2015   PLT 312 07/20/2015      Chemistry      Component Value Date/Time   NA 139 06/29/2015 0817   NA 139 07/12/2014 0512   K 4.6 06/29/2015 0817   K 3.9 07/12/2014 0512   CL 100 07/12/2014 0512   CO2 24 06/29/2015 0817   CO2 32 07/12/2014 0512   BUN 17.3 06/29/2015 0817   BUN 5* 07/12/2014 0512   CREATININE 0.8 06/29/2015 0817   CREATININE 0.55 07/12/2014 0512      Component Value Date/Time   CALCIUM 9.7 06/29/2015 0817   CALCIUM 9.0 07/12/2014 0512   ALKPHOS 93 06/29/2015 0817   ALKPHOS 91 07/10/2014 0955   AST 15 06/29/2015 0817   AST 15  07/10/2014 0955   ALT 16 06/29/2015 0817   ALT 8 07/10/2014 0955   BILITOT 0.30 06/29/2015 0817   BILITOT 0.4 07/10/2014 0955       RADIOGRAPHIC STUDIES: Ct Chest W Contrast  07/17/2015  CLINICAL DATA:  Subsequent encounter for lung cancer EXAM: CT CHEST, ABDOMEN, AND PELVIS WITH CONTRAST TECHNIQUE: Multidetector CT imaging of the chest, abdomen and pelvis was performed following the standard protocol during bolus administration of intravenous contrast. CONTRAST:  121mL OMNIPAQUE IOHEXOL 300 MG/ML  SOLN COMPARISON:  04/24/2015. FINDINGS: CT CHEST FINDINGS Mediastinum/Lymph Nodes: There is no axillary lymphadenopathy. No mediastinal lymphadenopathy. Small bilateral thyroid nodules are not substantially changed in the interval. The heart size is  normal. No pericardial effusion. The esophagus has normal imaging features. Lungs/Pleura: Biapical pleural-parenchymal scarring again noted. Posterior right apical nodule measures 1.4 x 2.4 cm today compared to 1.5 x 2.5 cm previously. Previously noted tiny nodule in the anteromedial right upper lobe, anterior to the dominant lesion, is stable. Ill-defined subpleural irregular nodular density in the superior segment right lower lobe (image 21 series 4) measures approximately 1.9 cm in maximum diameter, unchanged in the interval. Changes of emphysema are evident bilaterally with an upper lobe predominance. Musculoskeletal: Bone windows reveal no worrisome lytic or sclerotic osseous lesions. CT ABDOMEN PELVIS FINDINGS Hepatobiliary: No focal abnormality within the liver parenchyma. There is no evidence for gallstones, gallbladder wall thickening, or pericholecystic fluid. No intrahepatic or extrahepatic biliary dilation. Pancreas: No focal mass lesion. No dilatation of the main duct. No intraparenchymal cyst. No peripancreatic edema. Spleen: No splenomegaly. No focal mass lesion. Adrenals/Urinary Tract: No adrenal nodule or mass. No evidence for enhancing lesion in either kidney. No hydronephrosis. No evidence for hydroureter. The urinary bladder appears normal for the degree of distention. Stomach/Bowel: Stomach is nondistended. No gastric wall thickening. No evidence of outlet obstruction. Duodenum is normally positioned as is the ligament of Treitz. No small bowel wall thickening. No small bowel dilatation. The terminal ileum is normal. The appendix is normal. Suture line evident in the distal sigmoid colon with otherwise unremarkable colonic appearance. Vascular/Lymphatic: There is abdominal aortic atherosclerosis without aneurysm. There is no gastrohepatic or hepatoduodenal ligament lymphadenopathy. No intraperitoneal or retroperitoneal lymphadenopy. No pelvic sidewall lymphadenopathy. Reproductive: Uterus is  surgically absent. There is no adnexal mass. Other: No intraperitoneal free fluid. Musculoskeletal: Tiny left paraumbilical hernia contains only fat. Bone windows reveal no worrisome lytic or sclerotic osseous lesions. IMPRESSION: 1. Stable exam. No new or progressive findings in the chest, abdomen, or pelvis. 2. Posterior right apical dominant nodule, in the region of original tumor, it is unchanged. Other ill-defined nodular areas of soft tissue attenuation in the right lung or stable. 3. No evidence for distant metastatic disease. Imaging findings of potential clinical significance: Abdominal aortic atherosclerosis. Emphysema. Electronically Signed   By: Misty Stanley M.D.   On: 07/17/2015 09:21   Mr [name] [place] Contrast  07/17/2015  CLINICAL DATA:  Metastatic lung cancer status post stereotactic radiosurgery completed 09/2013. EXAM: MRI HEAD WITHOUT AND WITH CONTRAST TECHNIQUE: Multiplanar, multiecho pulse sequences of the brain and surrounding structures were obtained without and with intravenous contrast. CONTRAST:  37mL MULTIHANCE GADOBENATE DIMEGLUMINE 529 MG/ML IV SOLN COMPARISON:  04/09/2015 FINDINGS: There is no evidence of acute infarct, midline shift, or extra-axial fluid collection. A chronic microhemorrhage at the ventral aspect of the left thalamus is unchanged. Ventricles and sulci are normal for age. Patchy T2 hyperintensities in the deep greater than subcortical cerebral white matter have  not significantly changed and are nonspecific but may reflect chronic small vessel ischemic disease. 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 Mildly lobulated, peripherally enhancing lesion in the right parietal lobe is unchanged, measuring 15 x 9 mm (series 10, image 119). A small amount of chronic blood products and mild surrounding white matter T2 hyperintensity are also unchanged. No new lesions are identified. An incidental right frontal lobe developmental venous anomaly is again  noted. Orbits are unremarkable. Opacification of a left anterior ethmoid air cell is unchanged. Mastoid air cells are clear. Small left parietal scalp lesion is unchanged may represent a sebaceous cyst. Major intracranial vascular flow voids are preserved. IMPRESSION: 1. Unchanged, previously treated right parietal lesion. 2. No evidence of new metastases. Electronically Signed   By: Logan Bores M.D.   On: 07/17/2015 13:18   Ct Abdomen Pelvis W Contrast  07/17/2015  CLINICAL DATA:  Subsequent encounter for lung cancer EXAM: CT CHEST, ABDOMEN, AND PELVIS WITH CONTRAST TECHNIQUE: Multidetector CT imaging of the chest, abdomen and pelvis was performed following the standard protocol during bolus administration of intravenous contrast. CONTRAST:  18mL OMNIPAQUE IOHEXOL 300 MG/ML  SOLN COMPARISON:  04/24/2015. FINDINGS: CT CHEST FINDINGS Mediastinum/Lymph Nodes: There is no axillary lymphadenopathy. No mediastinal lymphadenopathy. Small bilateral thyroid nodules are not substantially changed in the interval. The heart size is normal. No pericardial effusion. The esophagus has normal imaging features. Lungs/Pleura: Biapical pleural-parenchymal scarring again noted. Posterior right apical nodule measures 1.4 x 2.4 cm today compared to 1.5 x 2.5 cm previously. Previously noted tiny nodule in the anteromedial right upper lobe, anterior to the dominant lesion, is stable. Ill-defined subpleural irregular nodular density in the superior segment right lower lobe (image 21 series 4) measures approximately 1.9 cm in maximum diameter, unchanged in the interval. Changes of emphysema are evident bilaterally with an upper lobe predominance. Musculoskeletal: Bone windows reveal no worrisome lytic or sclerotic osseous lesions. CT ABDOMEN PELVIS FINDINGS Hepatobiliary: No focal abnormality within the liver parenchyma. There is no evidence for gallstones, gallbladder wall thickening, or pericholecystic fluid. No intrahepatic or  extrahepatic biliary dilation. Pancreas: No focal mass lesion. No dilatation of the main duct. No intraparenchymal cyst. No peripancreatic edema. Spleen: No splenomegaly. No focal mass lesion. Adrenals/Urinary Tract: No adrenal nodule or mass. No evidence for enhancing lesion in either kidney. No hydronephrosis. No evidence for hydroureter. The urinary bladder appears normal for the degree of distention. Stomach/Bowel: Stomach is nondistended. No gastric wall thickening. No evidence of outlet obstruction. Duodenum is normally positioned as is the ligament of Treitz. No small bowel wall thickening. No small bowel dilatation. The terminal ileum is normal. The appendix is normal. Suture line evident in the distal sigmoid colon with otherwise unremarkable colonic appearance. Vascular/Lymphatic: There is abdominal aortic atherosclerosis without aneurysm. There is no gastrohepatic or hepatoduodenal ligament lymphadenopathy. No intraperitoneal or retroperitoneal lymphadenopy. No pelvic sidewall lymphadenopathy. Reproductive: Uterus is surgically absent. There is no adnexal mass. Other: No intraperitoneal free fluid. Musculoskeletal: Tiny left paraumbilical hernia contains only fat. Bone windows reveal no worrisome lytic or sclerotic osseous lesions. IMPRESSION: 1. Stable exam. No new or progressive findings in the chest, abdomen, or pelvis. 2. Posterior right apical dominant nodule, in the region of original tumor, it is unchanged. Other ill-defined nodular areas of soft tissue attenuation in the right lung or stable. 3. No evidence for distant metastatic disease. Imaging findings of potential clinical significance: Abdominal aortic atherosclerosis. Emphysema. Electronically Signed   By: Misty Stanley  M.D.   On: 07/17/2015 09:21    ASSESSMENT AND PLAN: This is a very pleasant 64 years old white female recently diagnosed with a stage IV non-small cell lung cancer, adenocarcinoma with negative EGFR mutation and negative  ALK gene translocation. The patient is status post stereotactic radiotherapy to a solitary brain metastases in addition to palliative radiotherapy to the right upper lobe lung mass. She status post 6 cycles of systemic chemotherapy with carboplatin and Alimta with improvement in her disease and she is currently undergoing maintenance chemotherapy with single agent Alimta status post 19 cycles and tolerating her treatment fairly well. The recent CT scan of the chest, abdomen and pelvis showed stable disease with no evidence for progression in the chest, abdomen or pelvis. I recommended for her to continue her current maintenance treatment with single agent Alimta 500 mg/M2 every 3 weeks. She will proceed with cycle #20 today as scheduled. For history of depression, the patient on Remeron 30 mg by mouth each bedtime. For anxiety, she is currently on Xanax 0.25 mg by mouth 3 times a day as needed and she was given a refill of her medication.  She will come back for follow-up visit in 3 weeks for reevaluation before proceeding with cycle #21.  The patient was advised to call immediately if she has any concerning symptoms in the interval. The patient voices understanding of current disease status and treatment options and is in agreement with the current care plan.  All questions were answered. The patient knows to call the clinic with any problems, questions or concerns. We can certainly see the patient much sooner if necessary.  Disclaimer: This note was dictated with voice recognition software. Similar sounding words can inadvertently be transcribed and may not be corrected upon review.

## 2015-07-20 NOTE — Patient Instructions (Signed)
[facility] Discharge Instructions for Patients Receiving Chemotherapy  Today you received the following chemotherapy agents; Alimta.   To help prevent nausea and vomiting after your treatment, we encourage you to take your nausea medication as directed.    If you develop nausea and vomiting that is not controlled by your nausea medication, call the clinic.   BELOW ARE SYMPTOMS THAT SHOULD BE REPORTED IMMEDIATELY:  *FEVER GREATER THAN 100.5 F  *CHILLS WITH OR WITHOUT FEVER  NAUSEA AND VOMITING THAT IS NOT CONTROLLED WITH YOUR NAUSEA MEDICATION  *UNUSUAL SHORTNESS OF BREATH  *UNUSUAL BRUISING OR BLEEDING  TENDERNESS IN MOUTH AND THROAT WITH OR WITHOUT PRESENCE OF ULCERS  *URINARY PROBLEMS  *BOWEL PROBLEMS  UNUSUAL RASH Items with * indicate a potential emergency and should be followed up as soon as possible.  Feel free to call the clinic you have any questions or concerns. The clinic phone number is [phone].  Please show the CHEMO ALERT CARD at check-in to the Emergency Department and triage nurse.   

## 2015-07-20 NOTE — Progress Notes (Signed)
Follow up s/p SRS Brain, here for MRI results,  No c/o nausea  No hearing loss, no dizzy ness or light headed,no pain,   Also to have chemothrapy infusin today, appetite good, no fatigue 10:31 AM Wt Readings from Last 3 Encounters:  07/20/15 152 lb 1.6 oz (68.992 kg)  06/29/15 152 lb 6.4 oz (69.128 kg)  06/08/15 147 lb 9.6 oz (66.951 kg)  BP 108/41 mmHg  Pulse 69  Temp(Src) 98 F (36.7 C) (Oral)  Resp 20  Wt 152 lb 11.2 oz (69.264 kg)  SpO2 99%

## 2015-07-20 NOTE — Telephone Encounter (Signed)
Gave and printed appt sched and avs fo rpt for DEC and Jan 2017

## 2015-07-28 ENCOUNTER — Other Ambulatory Visit: Payer: Self-pay | Admitting: Radiation Oncology

## 2015-08-07 ENCOUNTER — Other Ambulatory Visit: Payer: Self-pay | Admitting: Medical Oncology

## 2015-08-07 DIAGNOSIS — C7931 Secondary malignant neoplasm of brain: Secondary | ICD-10-CM

## 2015-08-07 DIAGNOSIS — C7949 Secondary malignant neoplasm of other parts of nervous system: Principal | ICD-10-CM

## 2015-08-10 ENCOUNTER — Encounter: Payer: Self-pay | Admitting: Internal Medicine

## 2015-08-10 ENCOUNTER — Ambulatory Visit (HOSPITAL_BASED_OUTPATIENT_CLINIC_OR_DEPARTMENT_OTHER): Payer: BLUE CROSS/BLUE SHIELD | Admitting: Internal Medicine

## 2015-08-10 ENCOUNTER — Encounter: Payer: Self-pay | Admitting: *Deleted

## 2015-08-10 ENCOUNTER — Ambulatory Visit (HOSPITAL_BASED_OUTPATIENT_CLINIC_OR_DEPARTMENT_OTHER): Payer: BLUE CROSS/BLUE SHIELD

## 2015-08-10 ENCOUNTER — Other Ambulatory Visit (HOSPITAL_BASED_OUTPATIENT_CLINIC_OR_DEPARTMENT_OTHER): Payer: BLUE CROSS/BLUE SHIELD

## 2015-08-10 ENCOUNTER — Other Ambulatory Visit: Payer: Self-pay | Admitting: Medical Oncology

## 2015-08-10 VITALS — BP 126/72 | HR 72 | Temp 98.0°F | Resp 18 | Ht 64.0 in | Wt 158.3 lb

## 2015-08-10 DIAGNOSIS — C7931 Secondary malignant neoplasm of brain: Secondary | ICD-10-CM

## 2015-08-10 DIAGNOSIS — F419 Anxiety disorder, unspecified: Secondary | ICD-10-CM

## 2015-08-10 DIAGNOSIS — Z5111 Encounter for antineoplastic chemotherapy: Secondary | ICD-10-CM | POA: Diagnosis not present

## 2015-08-10 DIAGNOSIS — F329 Major depressive disorder, single episode, unspecified: Secondary | ICD-10-CM

## 2015-08-10 DIAGNOSIS — C3411 Malignant neoplasm of upper lobe, right bronchus or lung: Secondary | ICD-10-CM

## 2015-08-10 DIAGNOSIS — C7949 Secondary malignant neoplasm of other parts of nervous system: Principal | ICD-10-CM

## 2015-08-10 DIAGNOSIS — F411 Generalized anxiety disorder: Secondary | ICD-10-CM

## 2015-08-10 LAB — COMPREHENSIVE METABOLIC PANEL
ALT: <9 U/L (ref 0–55)
AST: 10 U/L (ref 5–34)
Albumin: 3.5 g/dL (ref 3.5–5.0)
Alkaline Phosphatase: 81 U/L (ref 40–150)
Anion Gap: 10 mEq/L (ref 3–11)
BUN: 10.5 mg/dL (ref 7.0–26.0)
CO2: 25 mEq/L (ref 22–29)
Calcium: 9.1 mg/dL (ref 8.4–10.4)
Chloride: 108 mEq/L (ref 98–109)
Creatinine: 0.7 mg/dL (ref 0.6–1.1)
EGFR: 89 mL/min/{1.73_m2} — ABNORMAL LOW (ref >90)
Glucose: 114 mg/dl (ref 70–140)
Potassium: 3.7 mEq/L (ref 3.5–5.1)
Sodium: 143 mEq/L (ref 136–145)
Total Bilirubin: <0.3 mg/dL (ref 0.20–1.20)
Total Protein: 6.7 g/dL (ref 6.4–8.3)

## 2015-08-10 LAB — CBC WITH DIFFERENTIAL/PLATELET
BASO%: 0.3 % (ref 0.0–2.0)
Basophils Absolute: 0 10*3/uL (ref 0.0–0.1)
EOS%: 0 % (ref 0.0–7.0)
Eosinophils Absolute: 0 10*3/uL (ref 0.0–0.5)
HCT: 35 % (ref 34.8–46.6)
HGB: 11.5 g/dL — ABNORMAL LOW (ref 11.6–15.9)
LYMPH%: 4.5 % — ABNORMAL LOW (ref 14.0–49.7)
MCH: 32.8 pg (ref 25.1–34.0)
MCHC: 32.9 g/dL (ref 31.5–36.0)
MCV: 99.5 fL (ref 79.5–101.0)
MONO#: 0.5 10*3/uL (ref 0.1–0.9)
MONO%: 6.1 % (ref 0.0–14.0)
NEUT#: 7.1 10*3/uL — ABNORMAL HIGH (ref 1.5–6.5)
NEUT%: 89.1 % — ABNORMAL HIGH (ref 38.4–76.8)
Platelets: 226 10*3/uL (ref 145–400)
RBC: 3.52 10*6/uL — ABNORMAL LOW (ref 3.70–5.45)
RDW: 14.1 % (ref 11.2–14.5)
WBC: 8 10*3/uL (ref 3.9–10.3)
lymph#: 0.4 10*3/uL — ABNORMAL LOW (ref 0.9–3.3)

## 2015-08-10 MED ORDER — SODIUM CHLORIDE 0.9 % IV SOLN
500.0000 mg/m2 | Freq: Once | INTRAVENOUS | Status: AC
  Administered 2015-08-10: 900 mg via INTRAVENOUS
  Filled 2015-08-10: qty 8

## 2015-08-10 MED ORDER — ALPRAZOLAM 0.25 MG PO TABS: 0.2500 mg | ORAL_TABLET | Freq: Three times a day (TID) | ORAL | Status: DC | PRN

## 2015-08-10 MED ORDER — SODIUM CHLORIDE 0.9 % IV SOLN
Freq: Once | INTRAVENOUS | Status: AC
  Administered 2015-08-10: 10:00:00 via INTRAVENOUS
  Filled 2015-08-10: qty 4

## 2015-08-10 MED ORDER — CYANOCOBALAMIN 1000 MCG/ML IJ SOLN
INTRAMUSCULAR | Status: AC
  Filled 2015-08-10: qty 1

## 2015-08-10 MED ORDER — SODIUM CHLORIDE 0.9 % IV SOLN
Freq: Once | INTRAVENOUS | Status: AC
  Administered 2015-08-10: 10:00:00 via INTRAVENOUS

## 2015-08-10 MED ORDER — CYANOCOBALAMIN 1000 MCG/ML IJ SOLN
1000.0000 ug | Freq: Once | INTRAMUSCULAR | Status: AC
  Administered 2015-08-10: 1000 ug via INTRAMUSCULAR

## 2015-08-10 NOTE — Progress Notes (Signed)
Oncology Nurse Navigator Documentation  Oncology Nurse Navigator Flowsheets 08/10/2015  Navigator Encounter Type Clinic/MDC/I followed up with Alexis [name] today during her chemotherapy.  She states she is doing great, no barriers identified.   Patient Visit Type Follow-up  Treatment Phase Treatment  Barriers/Navigation Needs No barriers at this time  Time Spent with Patient 15

## 2015-08-10 NOTE — Progress Notes (Signed)
Called in xanax 

## 2015-08-10 NOTE — Patient Instructions (Signed)
[facility] Discharge Instructions for Patients Receiving Chemotherapy  Today you received the following chemotherapy agents Alimta.  To help prevent nausea and vomiting after your treatment, we encourage you to take your nausea medication as prescribed.   If you develop nausea and vomiting that is not controlled by your nausea medication, call the clinic.   BELOW ARE SYMPTOMS THAT SHOULD BE REPORTED IMMEDIATELY:  *FEVER GREATER THAN 100.5 F  *CHILLS WITH OR WITHOUT FEVER  NAUSEA AND VOMITING THAT IS NOT CONTROLLED WITH YOUR NAUSEA MEDICATION  *UNUSUAL SHORTNESS OF BREATH  *UNUSUAL BRUISING OR BLEEDING  TENDERNESS IN MOUTH AND THROAT WITH OR WITHOUT PRESENCE OF ULCERS  *URINARY PROBLEMS  *BOWEL PROBLEMS  UNUSUAL RASH Items with * indicate a potential emergency and should be followed up as soon as possible.  Feel free to call the clinic you have any questions or concerns. The clinic phone number is [phone].  Please show the CHEMO ALERT CARD at check-in to the Emergency Department and triage nurse.   

## 2015-08-10 NOTE — Progress Notes (Signed)
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  PRIOR THERAPY:  1) Status post stereotactic radiotherapy to a solitary right parietal brain lesion under the care of Dr. [name] on 10/16/2013. 2) Status post palliative radiotherapy to the right lung tumor under the care of Dr. [name] completed on 12/05/2013. 3) Systemic chemotherapy with carboplatin for AUC of 5 and Alimta 500 mg/M2 every 3 weeks. First dose Jan 06 2014. Status post 6 cycles.  CURRENT THERAPY: Systemic chemotherapy with maintenance Alimta 500 mg/M2 every 3 weeks. Status post 20 cycles.  INTERVAL HISTORY: NAHOMI HEGNER 64 y.o. female returns to the clinic today for 3 weeks followup visit accompanied by her husband. The patient is feeling fine today and denied having any specific complaints. She status post 20 cycles of maintenance chemotherapy. She is tolerating her maintenance chemotherapy with Alimta fairly well. She denied having any significant chest pain, shortness of breath, cough or hemoptysis. She has no fever or chills, no nausea or vomiting. She denied having any significant weight loss or night sweats. cycle #21.   MEDICAL HISTORY: Past Medical History  Diagnosis Date  . Ileus, postoperative 11/18/2013  . Physical deconditioning 11/18/2013  . Severe protein-calorie malnutrition ([place]) 11/18/2013  . Anemia 11/18/2013  . History of radiation therapy 10-28-13- 12-05-13    lung ca 50 Gy/60f  . Anxiety   . Hx of radiation therapy 10/16/13   rt parietal brain/20Gy  . 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 Encounter for antineoplastic chemotherapy 07/20/2015    ALLERGIES:  is allergic to codeine.  MEDICATIONS:  Current Outpatient Prescriptions  Medication Sig Dispense Refill  . acetaminophen (TYLENOL) 500 MG tablet Take 500 mg by mouth every 6 (six) hours as needed for mild pain or headache.    . ALPRAZolam (XANAX) 0.25 MG tablet Take 1 tablet (0.25 mg total) by mouth 3 (three) times daily as needed. for anxiety 45 tablet 0  . Ascorbic Acid (VITAMIN C GUMMIE PO) Take 1 each by mouth every morning.    .Marland Kitchendexamethasone (DECADRON) 4 MG tablet 4 mg by mouth twice a day the day before, day of and day after the chemotherapy every 3 weeks 40 tablet 1  . folic acid (FOLVITE) 1 MG tablet Take 1 tablet (1 mg total) by mouth daily. 30 tablet 2  . Multiple Vitamin (MULTIVITAMIN WITH MINERALS) TABS tablet Take 1 tablet by mouth every morning.    .Marland Kitchenomeprazole (PRILOSEC) 20 MG capsule Take 1 capsule (20 mg total) by mouth daily. As needed for reflux or indigestion. 42 capsule PRN  . ondansetron (ZOFRAN) 8 MG tablet Take 1 tablet (8 mg total) by mouth every 8 (eight) hours as needed for nausea or vomiting. 30 tablet 0  . OVER THE COUNTER MEDICATION Take 1 tablet by mouth every morning. (Vitamin A)    . pentoxifylline (TRENTAL) 400 MG CR tablet Take 1 tablet (400 mg total) by mouth 2 (two) times daily. Patient is  also to take vitamin E 400 mg BID with trental. 60 tablet 3  . pentoxifylline (TRENTAL) 400 MG CR tablet TAKE ONE TABLET BY MOUTH TWICE DAILY. ALSO TAKE VITAMIN E 400 UNITS TWICE DAILY WITH PENTOXIFYLLINE. 60 tablet 0  . prochlorperazine (COMPAZINE) 10 MG tablet Take 1 tablet (10 mg total) by mouth every 6 (six) hours as needed for nausea or vomiting. (Patient not taking: Reported on 07/20/2015) 60 tablet 0  . senna-docusate (SENOKOT-S) 8.6-50 MG tablet Take 1 tablet by mouth daily.  (Patient not taking: Reported on 07/20/2015) 30 tablet prn  . Zinc 50 MG TABS Take 1 tablet by mouth every morning.     No current facility-administered medications for this visit.    SURGICAL HISTORY:  Past Surgical History  Procedure Laterality Date  . Video bronchoscopy Bilateral 08/30/2013    Procedure: VIDEO BRONCHOSCOPY WITH FLUORO;  Surgeon: Tanda Rockers, MD;  Location: [facility];  Service: Cardiopulmonary;  Laterality: Bilateral;  . Abdominal hysterectomy    . Laparotomy N/A 11/03/2013    Procedure: EXPLORATORY LAPAROTOMY, DRAINAGE OF INTRA  ABDOMINAL ABSCESSES, MOBILIZATION OF SPLENIC FLEXURE, SIGMOID COLECTOMY WITH COLOSTOMY;  Surgeon: Odis Hollingshead, MD;  Location: [facility];  Service: General;  Laterality: N/A;  . Colostomy takedown N/A 07/10/2014    Procedure: LAPAROSCOPIC LYSIS OF ADHESIONS (90 MIN) LAPAROSCOPIC ASSISTED COLOSTOMY CLOSURE, RIGID PROCTOSIGMOIDOSCOPY;  Surgeon: Jackolyn Confer, MD;  Location: [facility];  Service: General;  Laterality: N/A;    REVIEW OF SYSTEMS:  Constitutional: negative Eyes: negative Ears, nose, mouth, throat, and face: negative Respiratory: negative Cardiovascular: negative Gastrointestinal: negative Genitourinary:negative Integument/breast: negative Hematologic/lymphatic: negative Musculoskeletal:negative Neurological: negative Behavioral/Psych: negative Endocrine: negative Allergic/Immunologic: negative   PHYSICAL EXAMINATION: General appearance: alert, cooperative, fatigued and no distress Head: Normocephalic, without obvious abnormality, atraumatic Neck: no adenopathy, no JVD, supple, symmetrical, trachea midline and thyroid not enlarged, symmetric, no tenderness/mass/nodules Lymph nodes: Cervical, supraclavicular, and axillary nodes normal. Resp: clear to auscultation bilaterally Back: symmetric, no curvature. ROM normal. No CVA tenderness. Cardio: regular rate and rhythm, S1, S2 normal, no murmur, click, rub or gallop GI:  soft, non-tender; bowel sounds normal; no masses,  no organomegaly Extremities: extremities normal, atraumatic, no cyanosis or edema Neurologic: Alert and oriented X 3, normal strength and tone. Normal symmetric reflexes. Normal coordination and gait  ECOG PERFORMANCE STATUS: 0 - Asymptomatic  Blood pressure 126/72, pulse 72, temperature 98 F (36.7 C), temperature source Oral, resp. rate 18, height 5' 4" (1.626 m), weight 158 lb 4.8 oz (71.804 kg), SpO2 100 %.  LABORATORY DATA: Lab Results  Component Value Date   WBC 8.0 08/10/2015   HGB 11.5* 08/10/2015   HCT 35.0 08/10/2015   MCV 99.5 08/10/2015   PLT 226 08/10/2015      Chemistry      Component Value Date/Time   NA 142 07/20/2015 0901   NA 139 07/12/2014 0512   K 3.8 07/20/2015 0901   K 3.9 07/12/2014 0512   CL 100 07/12/2014 0512   CO2 26 07/20/2015 0901   CO2 32 07/12/2014 0512   BUN 12.2 07/20/2015 0901   BUN 5* 07/12/2014 0512   CREATININE 0.8 07/20/2015 0901   CREATININE 0.55 07/12/2014 0512      Component Value Date/Time   CALCIUM 9.7 07/20/2015 0901   CALCIUM 9.0 07/12/2014 0512   ALKPHOS 94 07/20/2015 0901   ALKPHOS 91 07/10/2014 0955   AST 11 07/20/2015 0901   AST 15 07/10/2014 0955   ALT 11 07/20/2015 0901   ALT  8 07/10/2014 0955   BILITOT 0.33 07/20/2015 0901   BILITOT 0.4 07/10/2014 0955       RADIOGRAPHIC STUDIES: Ct Chest W Contrast  07/17/2015  CLINICAL DATA:  Subsequent encounter for lung cancer EXAM: CT CHEST, ABDOMEN, AND PELVIS WITH CONTRAST TECHNIQUE: Multidetector CT imaging of the chest, abdomen and pelvis was performed following the standard protocol during bolus administration of intravenous contrast. CONTRAST:  171m OMNIPAQUE IOHEXOL 300 MG/ML  SOLN COMPARISON:  04/24/2015. FINDINGS: CT CHEST FINDINGS Mediastinum/Lymph Nodes: There is no axillary lymphadenopathy. No mediastinal lymphadenopathy. Small bilateral thyroid nodules are not substantially changed in the interval. The heart  size is normal. No pericardial effusion. The esophagus has normal imaging features. Lungs/Pleura: Biapical pleural-parenchymal scarring again noted. Posterior right apical nodule measures 1.4 x 2.4 cm today compared to 1.5 x 2.5 cm previously. Previously noted tiny nodule in the anteromedial right upper lobe, anterior to the dominant lesion, is stable. Ill-defined subpleural irregular nodular density in the superior segment right lower lobe (image 21 series 4) measures approximately 1.9 cm in maximum diameter, unchanged in the interval. Changes of emphysema are evident bilaterally with an upper lobe predominance. Musculoskeletal: Bone windows reveal no worrisome lytic or sclerotic osseous lesions. CT ABDOMEN PELVIS FINDINGS Hepatobiliary: No focal abnormality within the liver parenchyma. There is no evidence for gallstones, gallbladder wall thickening, or pericholecystic fluid. No intrahepatic or extrahepatic biliary dilation. Pancreas: No focal mass lesion. No dilatation of the main duct. No intraparenchymal cyst. No peripancreatic edema. Spleen: No splenomegaly. No focal mass lesion. Adrenals/Urinary Tract: No adrenal nodule or mass. No evidence for enhancing lesion in either kidney. No hydronephrosis. No evidence for hydroureter. The urinary bladder appears normal for the degree of distention. Stomach/Bowel: Stomach is nondistended. No gastric wall thickening. No evidence of outlet obstruction. Duodenum is normally positioned as is the ligament of Treitz. No small bowel wall thickening. No small bowel dilatation. The terminal ileum is normal. The appendix is normal. Suture line evident in the distal sigmoid colon with otherwise unremarkable colonic appearance. Vascular/Lymphatic: There is abdominal aortic atherosclerosis without aneurysm. There is no gastrohepatic or hepatoduodenal ligament lymphadenopathy. No intraperitoneal or retroperitoneal lymphadenopy. No pelvic sidewall lymphadenopathy. Reproductive:  Uterus is surgically absent. There is no adnexal mass. Other: No intraperitoneal free fluid. Musculoskeletal: Tiny left paraumbilical hernia contains only fat. Bone windows reveal no worrisome lytic or sclerotic osseous lesions. IMPRESSION: 1. Stable exam. No new or progressive findings in the chest, abdomen, or pelvis. 2. Posterior right apical dominant nodule, in the region of original tumor, it is unchanged. Other ill-defined nodular areas of soft tissue attenuation in the right lung or stable. 3. No evidence for distant metastatic disease. Imaging findings of potential clinical significance: Abdominal aortic atherosclerosis. Emphysema. Electronically Signed   By: EMisty StanleyM.D.   On: 07/17/2015 09:21   Mr BJeri CosWYOContrast  07/17/2015  CLINICAL DATA:  Metastatic lung cancer status post stereotactic radiosurgery completed 09/2013. EXAM: MRI HEAD WITHOUT AND WITH CONTRAST TECHNIQUE: Multiplanar, multiecho pulse sequences of the brain and surrounding structures were obtained without and with intravenous contrast. CONTRAST:  154mMULTIHANCE GADOBENATE DIMEGLUMINE 529 MG/ML IV SOLN COMPARISON:  04/09/2015 FINDINGS: There is no evidence of acute infarct, midline shift, or extra-axial fluid collection. A chronic microhemorrhage at the ventral aspect of the left thalamus is unchanged. Ventricles and sulci are normal for age. Patchy T2 hyperintensities in the deep greater than subcortical cerebral white matter have not significantly changed and are nonspecific but may reflect chronic small  vessel ischemic disease. 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 Mildly lobulated, peripherally enhancing lesion in the right parietal lobe is unchanged, measuring 15 x 9 mm (series 10, image 119). A small amount of chronic blood products and mild surrounding white matter T2 hyperintensity are also unchanged. No new lesions are identified. An incidental right frontal lobe developmental venous anomaly  is again noted. Orbits are unremarkable. Opacification of a left anterior ethmoid air cell is unchanged. Mastoid air cells are clear. Small left parietal scalp lesion is unchanged may represent a sebaceous cyst. Major intracranial vascular flow voids are preserved. IMPRESSION: 1. Unchanged, previously treated right parietal lesion. 2. No evidence of new metastases. Electronically Signed   By: Logan Bores M.D.   On: 07/17/2015 13:18   Ct Abdomen Pelvis W Contrast  07/17/2015  CLINICAL DATA:  Subsequent encounter for lung cancer EXAM: CT CHEST, ABDOMEN, AND PELVIS WITH CONTRAST TECHNIQUE: Multidetector CT imaging of the chest, abdomen and pelvis was performed following the standard protocol during bolus administration of intravenous contrast. CONTRAST:  147m OMNIPAQUE IOHEXOL 300 MG/ML  SOLN COMPARISON:  04/24/2015. FINDINGS: CT CHEST FINDINGS Mediastinum/Lymph Nodes: There is no axillary lymphadenopathy. No mediastinal lymphadenopathy. Small bilateral thyroid nodules are not substantially changed in the interval. The heart size is normal. No pericardial effusion. The esophagus has normal imaging features. Lungs/Pleura: Biapical pleural-parenchymal scarring again noted. Posterior right apical nodule measures 1.4 x 2.4 cm today compared to 1.5 x 2.5 cm previously. Previously noted tiny nodule in the anteromedial right upper lobe, anterior to the dominant lesion, is stable. Ill-defined subpleural irregular nodular density in the superior segment right lower lobe (image 21 series 4) measures approximately 1.9 cm in maximum diameter, unchanged in the interval. Changes of emphysema are evident bilaterally with an upper lobe predominance. Musculoskeletal: Bone windows reveal no worrisome lytic or sclerotic osseous lesions. CT ABDOMEN PELVIS FINDINGS Hepatobiliary: No focal abnormality within the liver parenchyma. There is no evidence for gallstones, gallbladder wall thickening, or pericholecystic fluid. No  intrahepatic or extrahepatic biliary dilation. Pancreas: No focal mass lesion. No dilatation of the main duct. No intraparenchymal cyst. No peripancreatic edema. Spleen: No splenomegaly. No focal mass lesion. Adrenals/Urinary Tract: No adrenal nodule or mass. No evidence for enhancing lesion in either kidney. No hydronephrosis. No evidence for hydroureter. The urinary bladder appears normal for the degree of distention. Stomach/Bowel: Stomach is nondistended. No gastric wall thickening. No evidence of outlet obstruction. Duodenum is normally positioned as is the ligament of Treitz. No small bowel wall thickening. No small bowel dilatation. The terminal ileum is normal. The appendix is normal. Suture line evident in the distal sigmoid colon with otherwise unremarkable colonic appearance. Vascular/Lymphatic: There is abdominal aortic atherosclerosis without aneurysm. There is no gastrohepatic or hepatoduodenal ligament lymphadenopathy. No intraperitoneal or retroperitoneal lymphadenopy. No pelvic sidewall lymphadenopathy. Reproductive: Uterus is surgically absent. There is no adnexal mass. Other: No intraperitoneal free fluid. Musculoskeletal: Tiny left paraumbilical hernia contains only fat. Bone windows reveal no worrisome lytic or sclerotic osseous lesions. IMPRESSION: 1. Stable exam. No new or progressive findings in the chest, abdomen, or pelvis. 2. Posterior right apical dominant nodule, in the region of original tumor, it is unchanged. Other ill-defined nodular areas of soft tissue attenuation in the right lung or stable. 3. No evidence for distant metastatic disease. Imaging findings of potential clinical significance: Abdominal aortic atherosclerosis. Emphysema. Electronically Signed   By: EMisty StanleyM.D.   On: 07/17/2015 09:21    ASSESSMENT AND  PLAN: This is a very pleasant 64 years old white female recently diagnosed with a stage IV non-small cell lung cancer, adenocarcinoma with negative EGFR  mutation and negative ALK gene translocation. The patient is status post stereotactic radiotherapy to a solitary brain metastases in addition to palliative radiotherapy to the right upper lobe lung mass. She status post 6 cycles of systemic chemotherapy with carboplatin and Alimta with improvement in her disease and she is currently undergoing maintenance chemotherapy with single agent Alimta status post 20 cycles and tolerating her treatment fairly well. I recommended for her to continue her current maintenance treatment with single agent Alimta 500 mg/M2 every 3 weeks. She will proceed with cycle #21 today as scheduled. For history of depression, the patient on Remeron 30 mg by mouth each bedtime. For anxiety, she is currently on Xanax 0.25 mg by mouth 3 times a day as needed and she was given a refill of her medication.  She will come back for follow-up visit in 3 weeks for reevaluation before proceeding with cycle #22.  The patient was advised to call immediately if she has any concerning symptoms in the interval. The patient voices understanding of current disease status and treatment options and is in agreement with the current care plan.  All questions were answered. The patient knows to call the clinic with any problems, questions or concerns. We can certainly see the patient much sooner if necessary.  Disclaimer: This note was dictated with voice recognition software. Similar sounding words can inadvertently be transcribed and may not be corrected upon review.

## 2015-08-24 ENCOUNTER — Other Ambulatory Visit: Payer: Self-pay | Admitting: Radiation Oncology

## 2015-08-28 ENCOUNTER — Other Ambulatory Visit: Payer: Self-pay | Admitting: *Deleted

## 2015-08-28 DIAGNOSIS — C7949 Secondary malignant neoplasm of other parts of nervous system: Principal | ICD-10-CM

## 2015-08-28 DIAGNOSIS — C7931 Secondary malignant neoplasm of brain: Secondary | ICD-10-CM

## 2015-09-01 ENCOUNTER — Ambulatory Visit (HOSPITAL_BASED_OUTPATIENT_CLINIC_OR_DEPARTMENT_OTHER): Payer: BLUE CROSS/BLUE SHIELD

## 2015-09-01 ENCOUNTER — Encounter: Payer: Self-pay | Admitting: Oncology

## 2015-09-01 ENCOUNTER — Other Ambulatory Visit (HOSPITAL_BASED_OUTPATIENT_CLINIC_OR_DEPARTMENT_OTHER): Payer: BLUE CROSS/BLUE SHIELD

## 2015-09-01 ENCOUNTER — Ambulatory Visit (HOSPITAL_BASED_OUTPATIENT_CLINIC_OR_DEPARTMENT_OTHER): Payer: BLUE CROSS/BLUE SHIELD | Admitting: Oncology

## 2015-09-01 VITALS — BP 120/56 | HR 93 | Temp 98.3°F | Resp 18 | Ht 64.0 in | Wt 158.9 lb

## 2015-09-01 DIAGNOSIS — C7931 Secondary malignant neoplasm of brain: Secondary | ICD-10-CM

## 2015-09-01 DIAGNOSIS — C7949 Secondary malignant neoplasm of other parts of nervous system: Principal | ICD-10-CM

## 2015-09-01 DIAGNOSIS — C3411 Malignant neoplasm of upper lobe, right bronchus or lung: Secondary | ICD-10-CM | POA: Diagnosis not present

## 2015-09-01 DIAGNOSIS — Z5111 Encounter for antineoplastic chemotherapy: Secondary | ICD-10-CM | POA: Diagnosis not present

## 2015-09-01 LAB — CBC WITH DIFFERENTIAL/PLATELET
BASO%: 0.2 % (ref 0.0–2.0)
Basophils Absolute: 0 10*3/uL (ref 0.0–0.1)
EOS%: 0 % (ref 0.0–7.0)
Eosinophils Absolute: 0 10*3/uL (ref 0.0–0.5)
HCT: 37.7 % (ref 34.8–46.6)
HGB: 12.3 g/dL (ref 11.6–15.9)
LYMPH%: 6.8 % — ABNORMAL LOW (ref 14.0–49.7)
MCH: 32.2 pg (ref 25.1–34.0)
MCHC: 32.6 g/dL (ref 31.5–36.0)
MCV: 98.9 fL (ref 79.5–101.0)
MONO#: 0.6 10*3/uL (ref 0.1–0.9)
MONO%: 10.3 % (ref 0.0–14.0)
NEUT#: 5.2 10*3/uL (ref 1.5–6.5)
NEUT%: 82.7 % — ABNORMAL HIGH (ref 38.4–76.8)
Platelets: 320 10*3/uL (ref 145–400)
RBC: 3.81 10*6/uL (ref 3.70–5.45)
RDW: 13.8 % (ref 11.2–14.5)
WBC: 6.3 10*3/uL (ref 3.9–10.3)
lymph#: 0.4 10*3/uL — ABNORMAL LOW (ref 0.9–3.3)

## 2015-09-01 LAB — COMPREHENSIVE METABOLIC PANEL
ALT: 11 U/L (ref 0–55)
AST: 11 U/L (ref 5–34)
Albumin: 3.5 g/dL (ref 3.5–5.0)
Alkaline Phosphatase: 95 U/L (ref 40–150)
Anion Gap: 10 mEq/L (ref 3–11)
BUN: 11.3 mg/dL (ref 7.0–26.0)
CO2: 24 mEq/L (ref 22–29)
Calcium: 9.7 mg/dL (ref 8.4–10.4)
Chloride: 108 mEq/L (ref 98–109)
Creatinine: 0.7 mg/dL (ref 0.6–1.1)
EGFR: 86 mL/min/{1.73_m2} — ABNORMAL LOW (ref >90)
Glucose: 119 mg/dl (ref 70–140)
Potassium: 3.9 mEq/L (ref 3.5–5.1)
Sodium: 141 mEq/L (ref 136–145)
Total Bilirubin: 0.3 mg/dL (ref 0.20–1.20)
Total Protein: 7.2 g/dL (ref 6.4–8.3)

## 2015-09-01 MED ORDER — SODIUM CHLORIDE 0.9 % IV SOLN
500.0000 mg/m2 | Freq: Once | INTRAVENOUS | Status: AC
  Administered 2015-09-01: 900 mg via INTRAVENOUS
  Filled 2015-09-01: qty 32

## 2015-09-01 MED ORDER — SODIUM CHLORIDE 0.9 % IV SOLN
Freq: Once | INTRAVENOUS | Status: AC
  Administered 2015-09-01: 09:00:00 via INTRAVENOUS
  Filled 2015-09-01: qty 4

## 2015-09-01 MED ORDER — SODIUM CHLORIDE 0.9 % IV SOLN
Freq: Once | INTRAVENOUS | Status: AC
  Administered 2015-09-01: 09:00:00 via INTRAVENOUS

## 2015-09-01 NOTE — Progress Notes (Signed)
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  PRIOR THERAPY:  1) Status post stereotactic radiotherapy to a solitary right parietal brain lesion under the care of Dr. [name] on 10/16/2013. 2) Status post palliative radiotherapy to the right lung tumor under the care of Dr. [name] completed on 12/05/2013. 3) Systemic chemotherapy with carboplatin for AUC of 5 and Alimta 500 mg/M2 every 3 weeks. First dose Jan 06 2014. Status post 6 cycles.  CURRENT THERAPY: Systemic chemotherapy with maintenance Alimta 500 mg/M2 every 3 weeks. Status post 21 cycles.  INTERVAL HISTORY: Alexis [name] 65 y.o. female returns to the clinic today for 3 weeks followup visit accompanied by her husband. The patient is feeling fine today and denied having any specific complaints. Getting over a cold. Denies fevers. She status post 21 cycles of maintenance chemotherapy. She is tolerating her maintenance chemotherapy with Alimta fairly well. She denied having any significant chest pain, shortness of breath, cough or hemoptysis. She has no fever or chills, no nausea or vomiting. She denied having any significant weight loss or night sweats. Today is cycle #22.   MEDICAL HISTORY: Past Medical History  Diagnosis Date  . Ileus, postoperative 11/18/2013  . Physical deconditioning 11/18/2013  . Severe protein-calorie malnutrition ([facility]) 11/18/2013  . Anemia 11/18/2013  . History of radiation therapy 10-28-13- 12-05-13    lung ca 50 Gy/79f  .  Anxiety   . Hx of radiation therapy 10/16/13    rt parietal brain/20Gy  . 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 Encounter for antineoplastic chemotherapy 07/20/2015    ALLERGIES:  is allergic to codeine.  MEDICATIONS:  Current Outpatient Prescriptions  Medication Sig Dispense Refill  . acetaminophen (TYLENOL) 500 MG tablet Take 500 mg by mouth every 6 (six) hours as needed for mild pain or headache.    . ALPRAZolam (XANAX) 0.25 MG tablet Take 1 tablet (0.25 mg total) by mouth 3 (three) times daily as needed. for anxiety 45 tablet 0  . Ascorbic Acid (VITAMIN C GUMMIE PO) Take 1 each by mouth every morning.    .Marland Kitchendexamethasone (DECADRON) 4 MG tablet 4 mg by mouth twice a day the day before, day of and day after the chemotherapy every 3 weeks 40 tablet 1  . folic acid (FOLVITE) 1 MG tablet Take 1 tablet (1 mg total) by mouth daily. 30 tablet 2  . Multiple Vitamin (MULTIVITAMIN WITH MINERALS) TABS tablet Take 1 tablet by mouth every morning.    .Marland Kitchenomeprazole (PRILOSEC) 20 MG capsule Take 1 capsule (20 mg total) by mouth daily. As needed for reflux or indigestion. 42 capsule PRN  . ondansetron (ZOFRAN) 8 MG tablet Take 1 tablet (8 mg total) by mouth every 8 (eight) hours as needed for nausea or vomiting. (Patient not taking: Reported on 08/10/2015) 30 tablet 0  . OVER THE COUNTER MEDICATION Take 1 tablet by mouth every morning. (Vitamin A)    . pentoxifylline (TRENTAL) 400 MG  CR tablet Take 1 tablet (400 mg total) by mouth 2 (two) times daily. Patient is also to take vitamin E 400 mg BID with trental. 60 tablet 3  . pentoxifylline (TRENTAL) 400 MG CR tablet TAKE ONE TABLET BY MOUTH TWICE DAILY, ALSO  TAKE  VITAMIN  E  400  UNITS  TWICE  DAILY  WITH  PENTOXIFYLLINE 60 tablet 0  . prochlorperazine (COMPAZINE) 10 MG tablet Take 1 tablet (10 mg total) by mouth every 6 (six) hours as needed for nausea or vomiting. (Patient not taking: Reported on  07/20/2015) 60 tablet 0  . senna-docusate (SENOKOT-S) 8.6-50 MG tablet Take 1 tablet by mouth daily. 30 tablet prn  . Zinc 50 MG TABS Take 1 tablet by mouth every morning.     No current facility-administered medications for this visit.    SURGICAL HISTORY:  Past Surgical History  Procedure Laterality Date  . Video bronchoscopy Bilateral 08/30/2013    Procedure: VIDEO BRONCHOSCOPY WITH FLUORO;  Surgeon: Tanda Rockers, MD;  Location: [facility];  Service: Cardiopulmonary;  Laterality: Bilateral;  . Abdominal hysterectomy    . Laparotomy N/A 11/03/2013    Procedure: EXPLORATORY LAPAROTOMY, DRAINAGE OF INTRA  ABDOMINAL ABSCESSES, MOBILIZATION OF SPLENIC FLEXURE, SIGMOID COLECTOMY WITH COLOSTOMY;  Surgeon: Odis Hollingshead, MD;  Location: [facility];  Service: General;  Laterality: N/A;  . Colostomy takedown N/A 07/10/2014    Procedure: LAPAROSCOPIC LYSIS OF ADHESIONS (90 MIN) LAPAROSCOPIC ASSISTED COLOSTOMY CLOSURE, RIGID PROCTOSIGMOIDOSCOPY;  Surgeon: Jackolyn Confer, MD;  Location: [facility];  Service: General;  Laterality: N/A;    REVIEW OF SYSTEMS:  Constitutional: negative Eyes: negative Ears, nose, mouth, throat, and face: negative Respiratory: negative Cardiovascular: negative Gastrointestinal: negative Genitourinary:negative Integument/breast: negative Hematologic/lymphatic: negative Musculoskeletal:negative Neurological: negative Behavioral/Psych: negative Endocrine: negative Allergic/Immunologic: negative   PHYSICAL EXAMINATION: General appearance: alert, cooperative, fatigued and no distress Head: Normocephalic, without obvious abnormality, atraumatic Neck: no adenopathy, no JVD, supple, symmetrical, trachea midline and thyroid not enlarged, symmetric, no tenderness/mass/nodules Lymph nodes: Cervical, supraclavicular, and axillary nodes normal. Resp: clear to auscultation bilaterally Back: symmetric, no curvature. ROM normal. No CVA tenderness. Cardio: regular rate and  rhythm, S1, S2 normal, no murmur, click, rub or gallop GI: soft, non-tender; bowel sounds normal; no masses,  no organomegaly Extremities: extremities normal, atraumatic, no cyanosis or edema Neurologic: Alert and oriented X 3, normal strength and tone. Normal symmetric reflexes. Normal coordination and gait  ECOG PERFORMANCE STATUS: 0 - Asymptomatic  Blood pressure 120/56, pulse 93, temperature 98.3 F (36.8 C), temperature source Oral, resp. rate 18, height _0  (1.626 m), weight 158 lb 14.4 oz (72.077 kg), SpO2 99 %.  LABORATORY DATA: Lab Results  Component Value Date   WBC 6.3 09/01/2015   HGB 12.3 09/01/2015   HCT 37.7 09/01/2015   MCV 98.9 09/01/2015   PLT 320 09/01/2015      Chemistry      Component Value Date/Time   NA 143 08/10/2015 0820   NA 139 07/12/2014 0512   K 3.7 08/10/2015 0820   K 3.9 07/12/2014 0512   CL 100 07/12/2014 0512   CO2 25 08/10/2015 0820   CO2 32 07/12/2014 0512   BUN 10.5 08/10/2015 0820   BUN 5* 07/12/2014 0512   CREATININE 0.7 08/10/2015 0820   CREATININE 0.55 07/12/2014 0512      Component Value Date/Time   CALCIUM 9.1 08/10/2015 0820   CALCIUM 9.0 07/12/2014 0512   ALKPHOS 81 08/10/2015 0820   ALKPHOS 91 07/10/2014 0955  AST 10 08/10/2015 0820   AST 15 07/10/2014 0955   ALT <9 08/10/2015 0820   ALT 8 07/10/2014 0955   BILITOT <0.30 08/10/2015 0820   BILITOT 0.4 07/10/2014 0955       RADIOGRAPHIC STUDIES: No results found.  ASSESSMENT AND PLAN: This is a very pleasant 65 year old white female recently diagnosed with a stage IV non-small cell lung cancer, adenocarcinoma with negative EGFR mutation and negative ALK gene translocation. The patient is status post stereotactic radiotherapy to a solitary brain metastases in addition to palliative radiotherapy to the right upper lobe lung mass. She status post 6 cycles of systemic chemotherapy with carboplatin and Alimta with improvement in her disease and she is currently undergoing  maintenance chemotherapy with single agent Alimta status post 20 cycles and tolerating her treatment fairly well.  The patient was seen and discussed with Dr. Julien Nordmann. Recommended for her to continue her current maintenance treatment with single agent Alimta 500 mg/M2 every 3 weeks. She will proceed with cycle #22 today as scheduled.  For history of depression, the patient on Remeron 30 mg by mouth each bedtime. For anxiety, she is currently on Xanax 0.25 mg by mouth 3 times a day as needed.   She will come back for follow-up visit in 3 weeks for reevaluation before proceeding with cycle #23. Restaging CT scan will be done prior to next cycle.  The patient was advised to call immediately if she has any concerning symptoms in the interval. The patient voices understanding of current disease status and treatment options and is in agreement with the current care plan.  All questions were answered. The patient knows to call the clinic with any problems, questions or concerns. We can certainly see the patient much sooner if necessary.  Mikey Bussing, DNP, AGPCNP-BC, AOCNP  ADDENDUM: Hematology/Oncology Attending: I had a face to face encounter with the patient today. I recommended her care plan. This is a very pleasant 65 years old white female with stage IV non-small cell lung cancer who is currently undergoing maintenance treatment with single agent Alimta status post 21 cycle. The patient has no complaints today and has been tolerating her treatment fairly well with no significant adverse effects. I recommended for her to proceed with cycle #22 today as a scheduled. She will come back for follow-up visit in 3 weeks for reevaluation before starting the next cycle of her treatment after repeating CT scan of the chest, abdomen and pelvis for restaging of her disease. She was advised to call immediately if she has any concerning symptoms in the interval.   Disclaimer: This note was dictated with  voice recognition software. Similar sounding words can inadvertently be transcribed and may be missed upon review. Eilleen Kempf., MD 09/01/2015

## 2015-09-01 NOTE — Patient Instructions (Signed)
[facility] Discharge Instructions for Patients Receiving Chemotherapy  Today you received the following chemotherapy agents:  Alimta  To help prevent nausea and vomiting after your treatment, we encourage you to take your nausea medication as ordered per MD.   If you develop nausea and vomiting that is not controlled by your nausea medication, call the clinic.   BELOW ARE SYMPTOMS THAT SHOULD BE REPORTED IMMEDIATELY:  *FEVER GREATER THAN 100.5 F  *CHILLS WITH OR WITHOUT FEVER  NAUSEA AND VOMITING THAT IS NOT CONTROLLED WITH YOUR NAUSEA MEDICATION  *UNUSUAL SHORTNESS OF BREATH  *UNUSUAL BRUISING OR BLEEDING  TENDERNESS IN MOUTH AND THROAT WITH OR WITHOUT PRESENCE OF ULCERS  *URINARY PROBLEMS  *BOWEL PROBLEMS  UNUSUAL RASH Items with * indicate a potential emergency and should be followed up as soon as possible.  Feel free to call the clinic you have any questions or concerns. The clinic phone number is [phone].  Please show the CHEMO ALERT CARD at check-in to the Emergency Department and triage nurse.   

## 2015-09-18 ENCOUNTER — Encounter (HOSPITAL_COMMUNITY): Payer: Self-pay

## 2015-09-18 ENCOUNTER — Encounter: Payer: Self-pay | Admitting: Radiation Therapy

## 2015-09-18 ENCOUNTER — Ambulatory Visit (HOSPITAL_COMMUNITY)
Admission: RE | Admit: 2015-09-18 | Discharge: 2015-09-18 | Disposition: A | Discharge status: Home / Self Care | Payer: BLUE CROSS/BLUE SHIELD | Source: Ambulatory Visit | Attending: Oncology | Admitting: Oncology

## 2015-09-18 ENCOUNTER — Other Ambulatory Visit: Payer: Self-pay | Admitting: Internal Medicine

## 2015-09-18 DIAGNOSIS — J439 Emphysema, unspecified: Secondary | ICD-10-CM | POA: Diagnosis not present

## 2015-09-18 DIAGNOSIS — R911 Solitary pulmonary nodule: Secondary | ICD-10-CM | POA: Insufficient documentation

## 2015-09-18 DIAGNOSIS — C3411 Malignant neoplasm of upper lobe, right bronchus or lung: Secondary | ICD-10-CM | POA: Insufficient documentation

## 2015-09-18 MED ORDER — IOHEXOL 300 MG/ML  SOLN
100.0000 mL | Freq: Once | INTRAMUSCULAR | Status: AC | PRN
  Administered 2015-09-18: 100 mL via INTRAVENOUS

## 2015-09-18 NOTE — Progress Notes (Signed)
1.  Do you need a wheel chair?  no    2. On oxygen? no  3. Have you ever had any surgery in the body part being scanned? no  4. Have you ever had any surgery on your brain or heart?  no                                               5. Have you ever had surgery on your eyes or ears?    no                                                  6. Do you have a pacemaker or defibrillator?   no  7. Do you have a Neurostimulator? no  8. Claustrophobic?  no  9. Any risk for metal in eyes?  no  10. Injury by bullet, buckshot, or shrapnel?  no  11. Stent?      no                                                                                                            12. Hx of Cancer?  Yes     Lung cancer                                                                                                    13. Kidney or Liver disease?  no  14. Hx of Lupus, Rheumatoid Arthritis or Scleroderma?  no  15. IV Antibiotics or long term use of NSAIDS?   no  16. HX of Hypertension?   no  17. Diabetes?  no 18. Allergy to contrast?  no  19. Recent labs.   Yes in Epic

## 2015-09-21 ENCOUNTER — Encounter: Payer: Self-pay | Admitting: *Deleted

## 2015-09-21 ENCOUNTER — Telehealth: Payer: Self-pay | Admitting: Internal Medicine

## 2015-09-21 ENCOUNTER — Other Ambulatory Visit (HOSPITAL_BASED_OUTPATIENT_CLINIC_OR_DEPARTMENT_OTHER): Payer: BLUE CROSS/BLUE SHIELD

## 2015-09-21 ENCOUNTER — Ambulatory Visit (HOSPITAL_BASED_OUTPATIENT_CLINIC_OR_DEPARTMENT_OTHER): Payer: BLUE CROSS/BLUE SHIELD | Admitting: Internal Medicine

## 2015-09-21 ENCOUNTER — Telehealth: Payer: Self-pay | Admitting: *Deleted

## 2015-09-21 ENCOUNTER — Encounter: Payer: Self-pay | Admitting: Internal Medicine

## 2015-09-21 ENCOUNTER — Other Ambulatory Visit: Payer: Self-pay | Admitting: *Deleted

## 2015-09-21 ENCOUNTER — Ambulatory Visit (HOSPITAL_BASED_OUTPATIENT_CLINIC_OR_DEPARTMENT_OTHER): Payer: BLUE CROSS/BLUE SHIELD

## 2015-09-21 ENCOUNTER — Other Ambulatory Visit: Payer: Self-pay | Admitting: Radiation Therapy

## 2015-09-21 VITALS — BP 125/55 | HR 64 | Temp 98.4°F | Resp 17 | Ht 64.0 in | Wt 158.4 lb

## 2015-09-21 DIAGNOSIS — C7949 Secondary malignant neoplasm of other parts of nervous system: Principal | ICD-10-CM

## 2015-09-21 DIAGNOSIS — C7931 Secondary malignant neoplasm of brain: Secondary | ICD-10-CM

## 2015-09-21 DIAGNOSIS — Z5111 Encounter for antineoplastic chemotherapy: Secondary | ICD-10-CM | POA: Diagnosis not present

## 2015-09-21 DIAGNOSIS — C3411 Malignant neoplasm of upper lobe, right bronchus or lung: Secondary | ICD-10-CM | POA: Diagnosis not present

## 2015-09-21 DIAGNOSIS — F411 Generalized anxiety disorder: Secondary | ICD-10-CM

## 2015-09-21 LAB — COMPREHENSIVE METABOLIC PANEL
ALT: 10 U/L (ref 0–55)
AST: 11 U/L (ref 5–34)
Albumin: 3.7 g/dL (ref 3.5–5.0)
Alkaline Phosphatase: 89 U/L (ref 40–150)
Anion Gap: 10 mEq/L (ref 3–11)
BUN: 18.8 mg/dL (ref 7.0–26.0)
CO2: 24 mEq/L (ref 22–29)
Calcium: 9.3 mg/dL (ref 8.4–10.4)
Chloride: 107 mEq/L (ref 98–109)
Creatinine: 0.8 mg/dL (ref 0.6–1.1)
EGFR: 76 mL/min/{1.73_m2} — ABNORMAL LOW (ref >90)
Glucose: 96 mg/dl (ref 70–140)
Potassium: 4 mEq/L (ref 3.5–5.1)
Sodium: 140 mEq/L (ref 136–145)
Total Bilirubin: <0.3 mg/dL (ref 0.20–1.20)
Total Protein: 7.1 g/dL (ref 6.4–8.3)

## 2015-09-21 LAB — CBC WITH DIFFERENTIAL/PLATELET
BASO%: 0.1 % (ref 0.0–2.0)
Basophils Absolute: 0 10*3/uL (ref 0.0–0.1)
EOS%: 0 % (ref 0.0–7.0)
Eosinophils Absolute: 0 10*3/uL (ref 0.0–0.5)
HCT: 37.5 % (ref 34.8–46.6)
HGB: 12.2 g/dL (ref 11.6–15.9)
LYMPH%: 4 % — ABNORMAL LOW (ref 14.0–49.7)
MCH: 32.5 pg (ref 25.1–34.0)
MCHC: 32.5 g/dL (ref 31.5–36.0)
MCV: 100 fL (ref 79.5–101.0)
MONO#: 0.9 10*3/uL (ref 0.1–0.9)
MONO%: 9.3 % (ref 0.0–14.0)
NEUT#: 8.2 10*3/uL — ABNORMAL HIGH (ref 1.5–6.5)
NEUT%: 86.6 % — ABNORMAL HIGH (ref 38.4–76.8)
Platelets: 268 10*3/uL (ref 145–400)
RBC: 3.75 10*6/uL (ref 3.70–5.45)
RDW: 14.2 % (ref 11.2–14.5)
WBC: 9.5 10*3/uL (ref 3.9–10.3)
lymph#: 0.4 10*3/uL — ABNORMAL LOW (ref 0.9–3.3)

## 2015-09-21 MED ORDER — SODIUM CHLORIDE 0.9 % IV SOLN
Freq: Once | INTRAVENOUS | Status: AC
  Administered 2015-09-21: 10:00:00 via INTRAVENOUS

## 2015-09-21 MED ORDER — SODIUM CHLORIDE 0.9 % IV SOLN
Freq: Once | INTRAVENOUS | Status: AC
  Administered 2015-09-21: 10:00:00 via INTRAVENOUS
  Filled 2015-09-21: qty 4

## 2015-09-21 MED ORDER — ALPRAZOLAM 0.25 MG PO TABS: 0.2500 mg | ORAL_TABLET | Freq: Three times a day (TID) | ORAL | Status: DC | PRN

## 2015-09-21 MED ORDER — SODIUM CHLORIDE 0.9 % IV SOLN
500.0000 mg/m2 | Freq: Once | INTRAVENOUS | Status: AC
  Administered 2015-09-21: 900 mg via INTRAVENOUS
  Filled 2015-09-21: qty 32

## 2015-09-21 NOTE — Progress Notes (Signed)
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  PRIOR THERAPY:  1) Status post stereotactic radiotherapy to a solitary right parietal brain lesion under the care of Dr. [name] on 10/16/2013. 2) Status post palliative radiotherapy to the right lung tumor under the care of Dr. [name] completed on 12/05/2013. 3) Systemic chemotherapy with carboplatin for AUC of 5 and Alimta 500 mg/M2 every 3 weeks. First dose Jan 06 2014. Status post 6 cycles.  CURRENT THERAPY: Systemic chemotherapy with maintenance Alimta 500 mg/M2 every 3 weeks. Status post 22 cycles.  INTERVAL HISTORY: Alexis [name] 65 y.o. female returns to the clinic today for 3 weeks followup visit accompanied by her husband. The patient is feeling fine today and denied having any specific complaints. She status post 22 cycles of maintenance chemotherapy. She is tolerating her maintenance chemotherapy with Alimta fairly well. She denied having any significant chest pain, shortness of breath, cough or hemoptysis. She has no fever or chills, no nausea or vomiting. She denied having any significant weight loss or night sweats. She had repeat CT scan of the chest, abdomen and pelvis performed recently and she is here today for evaluation and discussion of her scan results before starting cycle #23.   MEDICAL HISTORY: Past Medical History  Diagnosis Date  . Ileus, postoperative 11/18/2013  . Physical deconditioning 11/18/2013  . Severe protein-calorie  malnutrition ([facility]) 11/18/2013  . Anemia 11/18/2013  . History of radiation therapy 10-28-13- 12-05-13    lung ca 50 Gy/33f  . Anxiety   . Hx of radiation therapy 10/16/13    rt parietal brain/20Gy  . 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 Encounter for antineoplastic chemotherapy 07/20/2015    ALLERGIES:  is allergic to codeine.  MEDICATIONS:  Current Outpatient Prescriptions  Medication Sig Dispense Refill  . acetaminophen (TYLENOL) 500 MG tablet Take 500 mg by mouth every 6 (six) hours as needed for mild pain or headache.    . ALPRAZolam (XANAX) 0.25 MG tablet Take 1 tablet (0.25 mg total) by mouth 3 (three) times daily as needed. for anxiety 45 tablet 0  . Ascorbic Acid (VITAMIN C GUMMIE PO) Take 1 each by mouth every morning.    .Marland Kitchendexamethasone (DECADRON) 4 MG tablet 4 mg by mouth twice a day the day before, day of and day after the chemotherapy every 3 weeks 40 tablet 1  . folic acid (FOLVITE) 1 MG tablet Take 1 tablet (1 mg total) by mouth daily. 30 tablet 2  . Multiple Vitamin (MULTIVITAMIN WITH MINERALS) TABS tablet Take 1 tablet by mouth every morning.    .Marland Kitchenomeprazole (PRILOSEC) 20 MG capsule Take 1 capsule (20 mg total) by mouth daily. As needed for reflux or indigestion. 42 capsule PRN  . ondansetron (ZOFRAN) 8 MG tablet Take 1 tablet (8 mg total) by mouth every 8 (eight) hours as needed for nausea or vomiting. 30 tablet 0  . OVER THE COUNTER MEDICATION Take 1 tablet  by mouth every morning. (Vitamin A)    . pentoxifylline (TRENTAL) 400 MG CR tablet Take 1 tablet (400 mg total) by mouth 2 (two) times daily. Patient is also to take vitamin E 400 mg BID with trental. 60 tablet 3  . pentoxifylline (TRENTAL) 400 MG CR tablet TAKE ONE TABLET BY MOUTH TWICE DAILY, ALSO  TAKE  VITAMIN  E  400  UNITS  TWICE  DAILY  WITH  PENTOXIFYLLINE 60 tablet 0  . prochlorperazine (COMPAZINE) 10 MG tablet Take 1 tablet (10 mg total) by mouth every 6  (six) hours as needed for nausea or vomiting. 60 tablet 0  . senna-docusate (SENOKOT-S) 8.6-50 MG tablet Take 1 tablet by mouth daily. 30 tablet prn  . Zinc 50 MG TABS Take 1 tablet by mouth every morning.     No current facility-administered medications for this visit.    SURGICAL HISTORY:  Past Surgical History  Procedure Laterality Date  . Video bronchoscopy Bilateral 08/30/2013    Procedure: VIDEO BRONCHOSCOPY WITH FLUORO;  Surgeon: Michael B Wert, MD;  Location: [facility];  Service: Cardiopulmonary;  Laterality: Bilateral;  . Abdominal hysterectomy    . Laparotomy N/A 11/03/2013    Procedure: EXPLORATORY LAPAROTOMY, DRAINAGE OF INTRA  ABDOMINAL ABSCESSES, MOBILIZATION OF SPLENIC FLEXURE, SIGMOID COLECTOMY WITH COLOSTOMY;  Surgeon: Todd J Rosenbower, MD;  Location: [facility];  Service: General;  Laterality: N/A;  . Colostomy takedown N/A 07/10/2014    Procedure: LAPAROSCOPIC LYSIS OF ADHESIONS (90 MIN) LAPAROSCOPIC ASSISTED COLOSTOMY CLOSURE, RIGID PROCTOSIGMOIDOSCOPY;  Surgeon: Todd Rosenbower, MD;  Location: [facility];  Service: General;  Laterality: N/A;    REVIEW OF SYSTEMS:  Constitutional: negative Eyes: negative Ears, nose, mouth, throat, and face: negative Respiratory: negative Cardiovascular: negative Gastrointestinal: negative Genitourinary:negative Integument/breast: negative Hematologic/lymphatic: negative Musculoskeletal:negative Neurological: negative Behavioral/Psych: negative Endocrine: negative Allergic/Immunologic: negative   PHYSICAL EXAMINATION: General appearance: alert, cooperative, fatigued and no distress Head: Normocephalic, without obvious abnormality, atraumatic Neck: no adenopathy, no JVD, supple, symmetrical, trachea midline and thyroid not enlarged, symmetric, no tenderness/mass/nodules Lymph nodes: Cervical, supraclavicular, and axillary nodes normal. Resp: clear to auscultation bilaterally Back: symmetric, no curvature. ROM normal. No CVA  tenderness. Cardio: regular rate and rhythm, S1, S2 normal, no murmur, click, rub or gallop GI: soft, non-tender; bowel sounds normal; no masses,  no organomegaly Extremities: extremities normal, atraumatic, no cyanosis or edema Neurologic: Alert and oriented X 3, normal strength and tone. Normal symmetric reflexes. Normal coordination and gait  ECOG PERFORMANCE STATUS: 0 - Asymptomatic  Blood pressure 125/55, pulse 64, temperature 98.4 F (36.9 C), temperature source Oral, resp. rate 17, height 5' 4" (1.626 m), weight 158 lb 6.4 oz (71.85 kg), SpO2 100 %.  LABORATORY DATA: Lab Results  Component Value Date   WBC 9.5 09/21/2015   HGB 12.2 09/21/2015   HCT 37.5 09/21/2015   MCV 100.0 09/21/2015   PLT 268 09/21/2015      Chemistry      Component Value Date/Time   NA 140 09/21/2015 0823   NA 139 07/12/2014 0512   K 4.0 09/21/2015 0823   K 3.9 07/12/2014 0512   CL 100 07/12/2014 0512   CO2 24 09/21/2015 0823   CO2 32 07/12/2014 0512   BUN 18.8 09/21/2015 0823   BUN 5* 07/12/2014 0512   CREATININE 0.8 09/21/2015 0823   CREATININE 0.55 07/12/2014 0512      Component Value Date/Time   CALCIUM 9.3 09/21/2015 0823   CALCIUM 9.0 07/12/2014 0512   ALKPHOS 89 09/21/2015 0823     ALKPHOS 91 07/10/2014 0955   AST 11 09/21/2015 0823   AST 15 07/10/2014 0955   ALT 10 09/21/2015 0823   ALT 8 07/10/2014 0955   BILITOT <0.30 09/21/2015 0823   BILITOT 0.4 07/10/2014 0955       RADIOGRAPHIC STUDIES: Ct Chest W Contrast  09/18/2015  CLINICAL DATA:  Metastatic right upper lobe lung cancer, restaging/response to therapy. EXAM: CT CHEST, ABDOMEN, AND PELVIS WITH CONTRAST TECHNIQUE: Multidetector CT imaging of the chest, abdomen and pelvis was performed following the standard protocol during bolus administration of intravenous contrast. CONTRAST:  100mL OMNIPAQUE IOHEXOL 300 MG/ML  SOLN COMPARISON:  07/17/2015 FINDINGS: CT CHEST FINDINGS Mediastinum/Nodes: Stable hypodense right thyroid  nodule. Right hilar nodal tissue 0.9 cm in short axis on image 25 series 2, formerly 0.8 cm by my measurement. Lungs/Pleura: The right upper lobe nodule measures 2.5 by 1.7 cm and has spiculated margins. Previous 2.4 by 1.4 cm. Mild right-sided airway thickening. Left apical scarring. Paraseptal emphysema. 5 mm sub solid nodule in the right upper lobe on image 25 series 4, not previously apparent. Stable scarring with faint nodularity anteriorly in the right upper lobe, image 15 series 4. Musculoskeletal: Thoracic spondylosis. CT ABDOMEN PELVIS FINDINGS Hepatobiliary: Unremarkable Pancreas: Unremarkable Spleen: Unremarkable Adrenals/Urinary Tract: Unremarkable Stomach/Bowel: Rectosigmoid anastomosis noted. Several colonic diverticula noted. Otherwise unremarkable. Vascular/Lymphatic: Aortoiliac atherosclerotic vascular disease. No pathologic adenopathy observed. Reproductive: Uterus absent.  Possible small ovarian remnants. Other: No supplemental non-categorized findings. Musculoskeletal: Infraumbilical hernia contains adipose tissue. IMPRESSION: 1. The right upper lobe nodule measures slightly larger than previous. No specific findings of distant metastatic disease. 2. Emphysema. 3. New 5 mm in diameter sub solid nodule in the left upper lobe on image 25 series 4 is likely inflammatory but merits observation. Electronically Signed   By: Walter  Liebkemann M.D.   On: 09/18/2015 10:12   Ct Abdomen Pelvis W Contrast  09/18/2015  CLINICAL DATA:  Metastatic right upper lobe lung cancer, restaging/response to therapy. EXAM: CT CHEST, ABDOMEN, AND PELVIS WITH CONTRAST TECHNIQUE: Multidetector CT imaging of the chest, abdomen and pelvis was performed following the standard protocol during bolus administration of intravenous contrast. CONTRAST:  100mL OMNIPAQUE IOHEXOL 300 MG/ML  SOLN COMPARISON:  07/17/2015 FINDINGS: CT CHEST FINDINGS Mediastinum/Nodes: Stable hypodense right thyroid nodule. Right hilar nodal tissue 0.9  cm in short axis on image 25 series 2, formerly 0.8 cm by my measurement. Lungs/Pleura: The right upper lobe nodule measures 2.5 by 1.7 cm and has spiculated margins. Previous 2.4 by 1.4 cm. Mild right-sided airway thickening. Left apical scarring. Paraseptal emphysema. 5 mm sub solid nodule in the right upper lobe on image 25 series 4, not previously apparent. Stable scarring with faint nodularity anteriorly in the right upper lobe, image 15 series 4. Musculoskeletal: Thoracic spondylosis. CT ABDOMEN PELVIS FINDINGS Hepatobiliary: Unremarkable Pancreas: Unremarkable Spleen: Unremarkable Adrenals/Urinary Tract: Unremarkable Stomach/Bowel: Rectosigmoid anastomosis noted. Several colonic diverticula noted. Otherwise unremarkable. Vascular/Lymphatic: Aortoiliac atherosclerotic vascular disease. No pathologic adenopathy observed. Reproductive: Uterus absent.  Possible small ovarian remnants. Other: No supplemental non-categorized findings. Musculoskeletal: Infraumbilical hernia contains adipose tissue. IMPRESSION: 1. The right upper lobe nodule measures slightly larger than previous. No specific findings of distant metastatic disease. 2. Emphysema. 3. New 5 mm in diameter sub solid nodule in the left upper lobe on image 25 series 4 is likely inflammatory but merits observation. Electronically Signed   By: Walter  Liebkemann M.D.   On: 09/18/2015 10:12    ASSESSMENT AND PLAN: This is a very pleasant 64 years   old white female recently diagnosed with a stage IV non-small cell lung cancer, adenocarcinoma with negative EGFR mutation and negative ALK gene translocation. The patient is status post stereotactic radiotherapy to a solitary brain metastases in addition to palliative radiotherapy to the right upper lobe lung mass. She status post 6 cycles of systemic chemotherapy with carboplatin and Alimta with improvement in her disease and she is currently undergoing maintenance chemotherapy with single agent Alimta status  post 22 cycles and tolerating her treatment fairly well. The recent CT scan of the chest, abdomen and pelvis showed slight increase in the right upper lobe lung mass in addition to new 0.5 cm sub-solid nodule in the left upper lobe questionable to be inflammatory in origin. I discussed the scan results with the patient and her husband. I recommended for her to continue her current maintenance treatment with single agent Alimta 500 mg/M2 every 3 weeks. She will proceed with cycle #23 today as scheduled. For history of depression, the patient on Remeron 30 mg by mouth each bedtime. For anxiety, she is currently on Xanax 0.5 mg by mouth as needed and she was given a refill of her medication.  She will come back for follow-up visit in 3 weeks for reevaluation before proceeding with cycle #24.  The patient was advised to call immediately if she has any concerning symptoms in the interval. The patient voices understanding of current disease status and treatment options and is in agreement with the current care plan.  All questions were answered. The patient knows to call the clinic with any problems, questions or concerns. We can certainly see the patient much sooner if necessary.  Disclaimer: This note was dictated with voice recognition software. Similar sounding words can inadvertently be transcribed and may not be corrected upon review.       

## 2015-09-21 NOTE — Telephone Encounter (Signed)
Per staff message and POF I have scheduled appts. Advised scheduler of appts. JMW  

## 2015-09-21 NOTE — Progress Notes (Signed)
Oncology Nurse Navigator Documentation  Oncology Nurse Navigator Flowsheets 09/21/2015  Navigator Encounter Type Clinic/MDC/I followed up with Alexis [name] today.  She has noted some light disease progression.  I spoke with her about this findings.  I listened as she explained her feelings.  I supported her.  I asked that she call me if needed.  She stated she would.  I also went to see her at the end of her tx.  I again supported and encouraged her and to call if any questions.    Patient Visit Type Follow-up  Treatment Phase Treatment  Acuity Level 2  Time Spent with Patient 30

## 2015-09-21 NOTE — Telephone Encounter (Signed)
Scheduled patient appt per pof, avs report printed.  °

## 2015-09-21 NOTE — Patient Instructions (Signed)
[facility] Discharge Instructions for Patients Receiving Chemotherapy  Today you received the following chemotherapy agents:  Alimta  To help prevent nausea and vomiting after your treatment, we encourage you to take your nausea medication as ordered per MD.   If you develop nausea and vomiting that is not controlled by your nausea medication, call the clinic.   BELOW ARE SYMPTOMS THAT SHOULD BE REPORTED IMMEDIATELY:  *FEVER GREATER THAN 100.5 F  *CHILLS WITH OR WITHOUT FEVER  NAUSEA AND VOMITING THAT IS NOT CONTROLLED WITH YOUR NAUSEA MEDICATION  *UNUSUAL SHORTNESS OF BREATH  *UNUSUAL BRUISING OR BLEEDING  TENDERNESS IN MOUTH AND THROAT WITH OR WITHOUT PRESENCE OF ULCERS  *URINARY PROBLEMS  *BOWEL PROBLEMS  UNUSUAL RASH Items with * indicate a potential emergency and should be followed up as soon as possible.  Feel free to call the clinic you have any questions or concerns. The clinic phone number is [phone].  Please show the CHEMO ALERT CARD at check-in to the Emergency Department and triage nurse.   

## 2015-09-24 ENCOUNTER — Telehealth: Payer: Self-pay | Admitting: Radiation Therapy

## 2015-09-24 NOTE — Telephone Encounter (Signed)
Earl left me a voicemail requesting follow up on a refill for Lucille's Folic acid prescribed by Dr. Julien Nordmann. Her insurance has caused her to switch to a different pharmacy. She is now at [facility] on [address] in [place]. Since it is a new pharmacy, she can't have them call in for the Rx as usual. She needs either a printed out Rx or the refill called in.   I left a voicemail on Dr. Worthy Flank nurse line requesting that they call Ms. Mchale at [phone] to take care of this.  Mont Dutton  Special Procedures Navigator [phone]

## 2015-09-25 ENCOUNTER — Other Ambulatory Visit: Payer: Self-pay | Admitting: Radiation Oncology

## 2015-09-25 MED ORDER — PENTOXIFYLLINE ER 400 MG PO TBCR: EXTENDED_RELEASE_TABLET | ORAL | Status: DC

## 2015-09-29 ENCOUNTER — Encounter: Payer: Self-pay | Admitting: *Deleted

## 2015-09-29 NOTE — Progress Notes (Signed)
Oncology Nurse Navigator Documentation  Oncology Nurse Navigator Flowsheets 09/29/2015  Navigator Location [facility]  Navigator Encounter Type Other/I received notification from [facility] that patient's has been denied insurance on anti emetic.  I gave this to Kary Kos in managed care to address.   Patient Visit Type Other  Acuity Level 1  Time Spent with Patient 15  Time Spent with Patient (Retired) -

## 2015-10-01 ENCOUNTER — Encounter: Payer: Self-pay | Admitting: *Deleted

## 2015-10-01 ENCOUNTER — Other Ambulatory Visit: Payer: Self-pay | Admitting: Internal Medicine

## 2015-10-01 DIAGNOSIS — C3411 Malignant neoplasm of upper lobe, right bronchus or lung: Secondary | ICD-10-CM

## 2015-10-01 DIAGNOSIS — C7931 Secondary malignant neoplasm of brain: Secondary | ICD-10-CM

## 2015-10-01 DIAGNOSIS — C7949 Secondary malignant neoplasm of other parts of nervous system: Principal | ICD-10-CM

## 2015-10-01 MED ORDER — FOLIC ACID 1 MG PO TABS: 1.0000 mg | ORAL_TABLET | Freq: Every day | ORAL | Status: DC

## 2015-10-01 NOTE — Progress Notes (Signed)
Oncology Nurse Navigator Documentation  Oncology Nurse Navigator Flowsheets 10/01/2015  Navigator Encounter Type Telephone  Patient Visit Type Follow-up/I called Alexis [name] to follow up regarding medication coverage.  [employer] has denied anti-emetics that she had been getting.  She is very sensitive to medication and suffers with a lot of nausea. I spoke with managed care to get approved and pharmacist.  We will continue to try getting it covered.  Patient also needed folic acid refilled.  Dr. Julien Nordmann re-ordered   Treatment Phase Treatment  Barriers/Navigation Needs Financial  Interventions Referrals  Acuity Level 3  Time Spent with Patient 30

## 2015-10-12 ENCOUNTER — Encounter: Payer: Self-pay | Admitting: Pharmacist

## 2015-10-12 ENCOUNTER — Other Ambulatory Visit: Payer: BLUE CROSS/BLUE SHIELD

## 2015-10-12 ENCOUNTER — Ambulatory Visit: Payer: BLUE CROSS/BLUE SHIELD | Admitting: Internal Medicine

## 2015-10-13 ENCOUNTER — Other Ambulatory Visit (HOSPITAL_BASED_OUTPATIENT_CLINIC_OR_DEPARTMENT_OTHER): Payer: BLUE CROSS/BLUE SHIELD

## 2015-10-13 ENCOUNTER — Other Ambulatory Visit: Payer: Self-pay | Admitting: Medical Oncology

## 2015-10-13 ENCOUNTER — Telehealth: Payer: Self-pay | Admitting: *Deleted

## 2015-10-13 ENCOUNTER — Ambulatory Visit (HOSPITAL_BASED_OUTPATIENT_CLINIC_OR_DEPARTMENT_OTHER): Payer: BLUE CROSS/BLUE SHIELD

## 2015-10-13 ENCOUNTER — Encounter: Payer: Self-pay | Admitting: Internal Medicine

## 2015-10-13 ENCOUNTER — Ambulatory Visit (HOSPITAL_BASED_OUTPATIENT_CLINIC_OR_DEPARTMENT_OTHER): Payer: BLUE CROSS/BLUE SHIELD | Admitting: Internal Medicine

## 2015-10-13 ENCOUNTER — Telehealth: Payer: Self-pay | Admitting: Internal Medicine

## 2015-10-13 ENCOUNTER — Encounter: Payer: Self-pay | Admitting: *Deleted

## 2015-10-13 VITALS — BP 111/49 | HR 72 | Temp 97.9°F | Resp 18 | Ht 64.0 in | Wt 158.9 lb

## 2015-10-13 DIAGNOSIS — C7931 Secondary malignant neoplasm of brain: Secondary | ICD-10-CM | POA: Diagnosis not present

## 2015-10-13 DIAGNOSIS — Z5111 Encounter for antineoplastic chemotherapy: Secondary | ICD-10-CM | POA: Diagnosis not present

## 2015-10-13 DIAGNOSIS — C7949 Secondary malignant neoplasm of other parts of nervous system: Secondary | ICD-10-CM

## 2015-10-13 DIAGNOSIS — F329 Major depressive disorder, single episode, unspecified: Secondary | ICD-10-CM | POA: Diagnosis not present

## 2015-10-13 DIAGNOSIS — C3411 Malignant neoplasm of upper lobe, right bronchus or lung: Secondary | ICD-10-CM

## 2015-10-13 DIAGNOSIS — F419 Anxiety disorder, unspecified: Secondary | ICD-10-CM | POA: Diagnosis not present

## 2015-10-13 LAB — CBC WITH DIFFERENTIAL/PLATELET
BASO%: 0.3 % (ref 0.0–2.0)
Basophils Absolute: 0 10*3/uL (ref 0.0–0.1)
EOS%: 0 % (ref 0.0–7.0)
Eosinophils Absolute: 0 10*3/uL (ref 0.0–0.5)
HCT: 38.3 % (ref 34.8–46.6)
HGB: 12.7 g/dL (ref 11.6–15.9)
LYMPH%: 5 % — ABNORMAL LOW (ref 14.0–49.7)
MCH: 32.5 pg (ref 25.1–34.0)
MCHC: 33.2 g/dL (ref 31.5–36.0)
MCV: 97.9 fL (ref 79.5–101.0)
MONO#: 0.7 10*3/uL (ref 0.1–0.9)
MONO%: 8.3 % (ref 0.0–14.0)
NEUT#: 7 10*3/uL — ABNORMAL HIGH (ref 1.5–6.5)
NEUT%: 86.4 % — ABNORMAL HIGH (ref 38.4–76.8)
Platelets: 295 10*3/uL (ref 145–400)
RBC: 3.91 10*6/uL (ref 3.70–5.45)
RDW: 14.1 % (ref 11.2–14.5)
WBC: 8.1 10*3/uL (ref 3.9–10.3)
lymph#: 0.4 10*3/uL — ABNORMAL LOW (ref 0.9–3.3)

## 2015-10-13 LAB — COMPREHENSIVE METABOLIC PANEL
ALT: 11 U/L (ref 0–55)
AST: 10 U/L (ref 5–34)
Albumin: 3.7 g/dL (ref 3.5–5.0)
Alkaline Phosphatase: 100 U/L (ref 40–150)
Anion Gap: 12 mEq/L — ABNORMAL HIGH (ref 3–11)
BUN: 14.1 mg/dL (ref 7.0–26.0)
CO2: 24 mEq/L (ref 22–29)
Calcium: 9.6 mg/dL (ref 8.4–10.4)
Chloride: 105 mEq/L (ref 98–109)
Creatinine: 0.9 mg/dL (ref 0.6–1.1)
EGFR: 72 mL/min/{1.73_m2} — ABNORMAL LOW (ref >90)
Glucose: 117 mg/dl (ref 70–140)
Potassium: 3.9 mEq/L (ref 3.5–5.1)
Sodium: 140 mEq/L (ref 136–145)
Total Bilirubin: 0.38 mg/dL (ref 0.20–1.20)
Total Protein: 7.5 g/dL (ref 6.4–8.3)

## 2015-10-13 MED ORDER — CYANOCOBALAMIN 1000 MCG/ML IJ SOLN
1000.0000 ug | Freq: Once | INTRAMUSCULAR | Status: AC
  Administered 2015-10-13: 1000 ug via INTRAMUSCULAR

## 2015-10-13 MED ORDER — SODIUM CHLORIDE 0.9 % IV SOLN
Freq: Once | INTRAVENOUS | Status: AC
  Administered 2015-10-13: 12:00:00 via INTRAVENOUS
  Filled 2015-10-13: qty 4

## 2015-10-13 MED ORDER — SODIUM CHLORIDE 0.9 % IV SOLN
500.0000 mg/m2 | Freq: Once | INTRAVENOUS | Status: AC
  Administered 2015-10-13: 900 mg via INTRAVENOUS
  Filled 2015-10-13: qty 32

## 2015-10-13 MED ORDER — SODIUM CHLORIDE 0.9 % IV SOLN
Freq: Once | INTRAVENOUS | Status: AC
  Administered 2015-10-13: 11:00:00 via INTRAVENOUS

## 2015-10-13 MED ORDER — CYANOCOBALAMIN 1000 MCG/ML IJ SOLN
INTRAMUSCULAR | Status: AC
  Filled 2015-10-13: qty 1

## 2015-10-13 NOTE — Progress Notes (Signed)
[facility] [place] [phone]   Fax:([phone]) [place], [place] [address] [place] [address]  DIAGNOSIS: Stage IV (T2a, N0, M1b) non-small cell lung cancer consistent with adenocarcinoma with negative EGFR mutation and negative ALK gene translocation diagnosed in January of 2015 presented with right upper lobe lung mass in addition to a solitary brain metastasis status post stereotactic to a solitary brain metastasis as well as radiotherapy to the right upper lobe lung mass.  PRIOR THERAPY:  1) Status post stereotactic radiotherapy to a solitary right parietal brain lesion under the care of Dr. [name] on 10/16/2013. 2) Status post palliative radiotherapy to the right lung tumor under the care of Dr. [name] completed on 12/05/2013. 3) Systemic chemotherapy with carboplatin for AUC of 5 and Alimta 500 mg/M2 every 3 weeks. First dose Jan 06 2014. Status post 6 cycles.  CURRENT THERAPY: Systemic chemotherapy with maintenance Alimta 500 mg/M2 every 3 weeks. Status post 23 cycles.  INTERVAL HISTORY: Alexis [name] 65 y.o. female returns to the clinic today for 3 weeks followup visit. The patient is feeling fine today and denied having any specific complaints. She status post 23 cycles of maintenance chemotherapy. She is tolerating her maintenance chemotherapy with Alimta fairly well. She denied having any significant chest pain, shortness of breath, cough or hemoptysis. She has no fever or chills, no nausea or vomiting. She denied having any significant weight loss or night sweats. She is here today for evaluation before starting cycle #24.   MEDICAL HISTORY: Past Medical History  Diagnosis Date  . Ileus, postoperative 11/18/2013  . Physical deconditioning 11/18/2013  . Severe protein-calorie malnutrition ([place]) 11/18/2013  . Anemia 11/18/2013  . History of radiation therapy 10-28-13- 12-05-13    lung ca 50 Gy/30f  . Anxiety   . Hx of  radiation therapy 10/16/13    rt parietal brain/20Gy  . 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 Encounter for antineoplastic chemotherapy 07/20/2015    ALLERGIES:  is allergic to codeine.  MEDICATIONS:  Current Outpatient Prescriptions  Medication Sig Dispense Refill  . acetaminophen (TYLENOL) 500 MG tablet Take 500 mg by mouth every 6 (six) hours as needed for mild pain or headache.    . ALPRAZolam (XANAX) 0.25 MG tablet Take 1 tablet (0.25 mg total) by mouth 3 (three) times daily as needed. for anxiety 45 tablet 0  . ALPRAZolam (XANAX) 0.25 MG tablet Take 1 tablet (0.25 mg total) by mouth 3 (three) times daily as needed. for anxiety 45 tablet 0  . Ascorbic Acid (VITAMIN C GUMMIE PO) Take 1 each by mouth every morning.    .Marland Kitchendexamethasone (DECADRON) 4 MG tablet 4 mg by mouth twice a day the day before, day of and day after the chemotherapy every 3 weeks 40 tablet 1  . folic acid (FOLVITE) 1 MG tablet Take 1 tablet (1 mg total) by mouth daily. 30 tablet 2  . Multiple Vitamin (MULTIVITAMIN WITH MINERALS) TABS tablet Take 1 tablet by mouth every morning.    .Marland Kitchenomeprazole (PRILOSEC) 20 MG capsule Take 1 capsule (20 mg total) by mouth daily. As needed for reflux or indigestion. 42 capsule PRN  . ondansetron (ZOFRAN) 8 MG tablet Take 1 tablet (8 mg total) by mouth every 8 (eight) hours as needed for nausea or vomiting. 30 tablet 0  . OVER THE COUNTER MEDICATION Take  1 tablet by mouth every morning. (Vitamin A)    . pentoxifylline (TRENTAL) 400 MG CR tablet Take 1 tablet (400 mg total) by mouth 2 (two) times daily. Patient is also to take vitamin E 400 mg BID with trental. 60 tablet 3  . pentoxifylline (TRENTAL) 400 MG CR tablet TAKE ONE TABLET BY MOUTH TWICE DAILY, ALSO  TAKE  VITAMIN  E  400  UNITS  TWICE  DAILY  WITH  PENTOXIFYLLINE 60 tablet 0  . prochlorperazine (COMPAZINE) 10 MG tablet Take 1 tablet (10 mg total) by mouth every 6 (six) hours as  needed for nausea or vomiting. 60 tablet 0  . senna-docusate (SENOKOT-S) 8.6-50 MG tablet Take 1 tablet by mouth daily. 30 tablet prn  . Zinc 50 MG TABS Take 1 tablet by mouth every morning.     No current facility-administered medications for this visit.    SURGICAL HISTORY:  Past Surgical History  Procedure Laterality Date  . Video bronchoscopy Bilateral 08/30/2013    Procedure: VIDEO BRONCHOSCOPY WITH FLUORO;  Surgeon: Tanda Rockers, MD;  Location: [facility];  Service: Cardiopulmonary;  Laterality: Bilateral;  . Abdominal hysterectomy    . Laparotomy N/A 11/03/2013    Procedure: EXPLORATORY LAPAROTOMY, DRAINAGE OF INTRA  ABDOMINAL ABSCESSES, MOBILIZATION OF SPLENIC FLEXURE, SIGMOID COLECTOMY WITH COLOSTOMY;  Surgeon: Odis Hollingshead, MD;  Location: [facility];  Service: General;  Laterality: N/A;  . Colostomy takedown N/A 07/10/2014    Procedure: LAPAROSCOPIC LYSIS OF ADHESIONS (90 MIN) LAPAROSCOPIC ASSISTED COLOSTOMY CLOSURE, RIGID PROCTOSIGMOIDOSCOPY;  Surgeon: Jackolyn Confer, MD;  Location: [facility];  Service: General;  Laterality: N/A;    REVIEW OF SYSTEMS:  A comprehensive review of systems was negative.   PHYSICAL EXAMINATION: General appearance: alert, cooperative, fatigued and no distress Head: Normocephalic, without obvious abnormality, atraumatic Neck: no adenopathy, no JVD, supple, symmetrical, trachea midline and thyroid not enlarged, symmetric, no tenderness/mass/nodules Lymph nodes: Cervical, supraclavicular, and axillary nodes normal. Resp: clear to auscultation bilaterally Back: symmetric, no curvature. ROM normal. No CVA tenderness. Cardio: regular rate and rhythm, S1, S2 normal, no murmur, click, rub or gallop GI: soft, non-tender; bowel sounds normal; no masses,  no organomegaly Extremities: extremities normal, atraumatic, no cyanosis or edema Neurologic: Alert and oriented X 3, normal strength and tone. Normal symmetric reflexes. Normal coordination and gait  ECOG  PERFORMANCE STATUS: 0 - Asymptomatic  Blood pressure 111/49, pulse 72, temperature 97.9 F (36.6 C), temperature source Oral, resp. rate 18, height _0  (1.626 m), weight 158 lb 14.4 oz (72.077 kg), SpO2 100 %.  LABORATORY DATA: Lab Results  Component Value Date   WBC 8.1 10/13/2015   HGB 12.7 10/13/2015   HCT 38.3 10/13/2015   MCV 97.9 10/13/2015   PLT 295 10/13/2015      Chemistry      Component Value Date/Time   NA 140 09/21/2015 0823   NA 139 07/12/2014 0512   K 4.0 09/21/2015 0823   K 3.9 07/12/2014 0512   CL 100 07/12/2014 0512   CO2 24 09/21/2015 0823   CO2 32 07/12/2014 0512   BUN 18.8 09/21/2015 0823   BUN 5* 07/12/2014 0512   CREATININE 0.8 09/21/2015 0823   CREATININE 0.55 07/12/2014 0512      Component Value Date/Time   CALCIUM 9.3 09/21/2015 0823   CALCIUM 9.0 07/12/2014 0512   ALKPHOS 89 09/21/2015 0823   ALKPHOS 91 07/10/2014 0955   AST 11 09/21/2015 0823   AST 15 07/10/2014 0955   ALT 10  09/21/2015 0823   ALT 8 07/10/2014 0955   BILITOT <0.30 09/21/2015 0823   BILITOT 0.4 07/10/2014 0955       RADIOGRAPHIC STUDIES: Ct Chest W Contrast  09/18/2015  CLINICAL DATA:  Metastatic right upper lobe lung cancer, restaging/response to therapy. EXAM: CT CHEST, ABDOMEN, AND PELVIS WITH CONTRAST TECHNIQUE: Multidetector CT imaging of the chest, abdomen and pelvis was performed following the standard protocol during bolus administration of intravenous contrast. CONTRAST:  186m OMNIPAQUE IOHEXOL 300 MG/ML  SOLN COMPARISON:  07/17/2015 FINDINGS: CT CHEST FINDINGS Mediastinum/Nodes: Stable hypodense right thyroid nodule. Right hilar nodal tissue 0.9 cm in short axis on image 25 series 2, formerly 0.8 cm by my measurement. Lungs/Pleura: The right upper lobe nodule measures 2.5 by 1.7 cm and has spiculated margins. Previous 2.4 by 1.4 cm. Mild right-sided airway thickening. Left apical scarring. Paraseptal emphysema. 5 mm sub solid nodule in the right upper lobe on image  25 series 4, not previously apparent. Stable scarring with faint nodularity anteriorly in the right upper lobe, image 15 series 4. Musculoskeletal: Thoracic spondylosis. CT ABDOMEN PELVIS FINDINGS Hepatobiliary: Unremarkable Pancreas: Unremarkable Spleen: Unremarkable Adrenals/Urinary Tract: Unremarkable Stomach/Bowel: Rectosigmoid anastomosis noted. Several colonic diverticula noted. Otherwise unremarkable. Vascular/Lymphatic: Aortoiliac atherosclerotic vascular disease. No pathologic adenopathy observed. Reproductive: Uterus absent.  Possible small ovarian remnants. Other: No supplemental non-categorized findings. Musculoskeletal: Infraumbilical hernia contains adipose tissue. IMPRESSION: 1. The right upper lobe nodule measures slightly larger than previous. No specific findings of distant metastatic disease. 2. Emphysema. 3. New 5 mm in diameter sub solid nodule in the left upper lobe on image 25 series 4 is likely inflammatory but merits observation. Electronically Signed   By: WVan ClinesM.D.   On: 09/18/2015 10:12   Ct Abdomen Pelvis W Contrast  09/18/2015  CLINICAL DATA:  Metastatic right upper lobe lung cancer, restaging/response to therapy. EXAM: CT CHEST, ABDOMEN, AND PELVIS WITH CONTRAST TECHNIQUE: Multidetector CT imaging of the chest, abdomen and pelvis was performed following the standard protocol during bolus administration of intravenous contrast. CONTRAST:  1041mOMNIPAQUE IOHEXOL 300 MG/ML  SOLN COMPARISON:  07/17/2015 FINDINGS: CT CHEST FINDINGS Mediastinum/Nodes: Stable hypodense right thyroid nodule. Right hilar nodal tissue 0.9 cm in short axis on image 25 series 2, formerly 0.8 cm by my measurement. Lungs/Pleura: The right upper lobe nodule measures 2.5 by 1.7 cm and has spiculated margins. Previous 2.4 by 1.4 cm. Mild right-sided airway thickening. Left apical scarring. Paraseptal emphysema. 5 mm sub solid nodule in the right upper lobe on image 25 series 4, not previously  apparent. Stable scarring with faint nodularity anteriorly in the right upper lobe, image 15 series 4. Musculoskeletal: Thoracic spondylosis. CT ABDOMEN PELVIS FINDINGS Hepatobiliary: Unremarkable Pancreas: Unremarkable Spleen: Unremarkable Adrenals/Urinary Tract: Unremarkable Stomach/Bowel: Rectosigmoid anastomosis noted. Several colonic diverticula noted. Otherwise unremarkable. Vascular/Lymphatic: Aortoiliac atherosclerotic vascular disease. No pathologic adenopathy observed. Reproductive: Uterus absent.  Possible small ovarian remnants. Other: No supplemental non-categorized findings. Musculoskeletal: Infraumbilical hernia contains adipose tissue. IMPRESSION: 1. The right upper lobe nodule measures slightly larger than previous. No specific findings of distant metastatic disease. 2. Emphysema. 3. New 5 mm in diameter sub solid nodule in the left upper lobe on image 25 series 4 is likely inflammatory but merits observation. Electronically Signed   By: WaVan Clines.D.   On: 09/18/2015 10:12    ASSESSMENT AND PLAN: This is a very pleasant [age] old white female recently diagnosed with a stage IV non-small cell lung cancer, adenocarcinoma with negative EGFR mutation and negative  ALK gene translocation. The patient is status post stereotactic radiotherapy to a solitary brain metastases in addition to palliative radiotherapy to the right upper lobe lung mass. She status post 6 cycles of systemic chemotherapy with carboplatin and Alimta with improvement in her disease and she is currently undergoing maintenance chemotherapy with single agent Alimta status post 23 cycles and tolerating her treatment fairly well. I recommended for her to continue her current maintenance treatment with single agent Alimta 500 mg/M2 every 3 weeks. She will proceed with cycle #24 today as scheduled. For history of depression, the patient on Remeron 30 mg by mouth each bedtime. For anxiety, she would continue on Xanax 0.5  mg by mouth.  She will come back for follow-up visit in 3 weeks for reevaluation before proceeding with cycle #25.  The patient was advised to call immediately if she has any concerning symptoms in the interval. The patient voices understanding of current disease status and treatment options and is in agreement with the current care plan.  All questions were answered. The patient knows to call the clinic with any problems, questions or concerns. We can certainly see the patient much sooner if necessary.  Disclaimer: This note was dictated with voice recognition software. Similar sounding words can inadvertently be transcribed and may not be corrected upon review.

## 2015-10-13 NOTE — Progress Notes (Signed)
Oncology Nurse Navigator Documentation  Oncology Nurse Navigator Flowsheets 10/13/2015  Navigator Encounter Type Follow-up Appt/I spoke with patient and husband today while they were waiting for tx.  She states she is feeling good without complaints.  No barriers identified at this time  Patient Visit Type Follow-up  Treatment Phase Treatment  Barriers/Navigation Needs No barriers at this time  Acuity Level 1  Time Spent with Patient 15

## 2015-10-13 NOTE — Telephone Encounter (Signed)
Per staff message and POF I have scheduled appts. Advised scheduler of appts. JMW  

## 2015-10-13 NOTE — Patient Instructions (Signed)
[facility] Discharge Instructions for Patients Receiving Chemotherapy  Today you received the following chemotherapy agents:  Alimta  To help prevent nausea and vomiting after your treatment, we encourage you to take your nausea medication as prescribed.   If you develop nausea and vomiting that is not controlled by your nausea medication, call the clinic.   BELOW ARE SYMPTOMS THAT SHOULD BE REPORTED IMMEDIATELY:  *FEVER GREATER THAN 100.5 F  *CHILLS WITH OR WITHOUT FEVER  NAUSEA AND VOMITING THAT IS NOT CONTROLLED WITH YOUR NAUSEA MEDICATION  *UNUSUAL SHORTNESS OF BREATH  *UNUSUAL BRUISING OR BLEEDING  TENDERNESS IN MOUTH AND THROAT WITH OR WITHOUT PRESENCE OF ULCERS  *URINARY PROBLEMS  *BOWEL PROBLEMS  UNUSUAL RASH Items with * indicate a potential emergency and should be followed up as soon as possible.  Feel free to call the clinic you have any questions or concerns. The clinic phone number is [phone].  Please show the [facility] at check-in to the Emergency Department and triage nurse.  Pemetrexed injection What is this medicine? PEMETREXED (PEM e TREX ed) is a chemotherapy drug. This medicine affects cells that are rapidly growing, such as cancer cells and cells in your mouth and stomach. It is usually used to treat lung cancers like non-small cell lung cancer and mesothelioma. It may also be used to treat other cancers. This medicine may be used for other purposes; ask your health care provider or pharmacist if you have questions. What should I tell my health care provider before I take this medicine? They need to know if you have any of these conditions: -if you frequently drink alcohol containing beverages -infection (especially a virus infection such as chickenpox, cold sores, or herpes) -kidney disease -liver disease -low blood counts, like low platelets, red bloods, or white blood cells -an unusual or allergic reaction to  pemetrexed, mannitol, other medicines, foods, dyes, or preservatives -pregnant or trying to get pregnant -breast-feeding How should I use this medicine? This drug is given as an infusion into a vein. It is administered in a hospital or clinic by a specially trained health care professional. Talk to your pediatrician regarding the use of this medicine in children. Special care may be needed. Overdosage: If you think you have taken too much of this medicine contact a poison control center or emergency room at once. NOTE: This medicine is only for you. Do not share this medicine with others. What if I miss a dose? It is important not to miss your dose. Call your doctor or health care professional if you are unable to keep an appointment. What may interact with this medicine? -aspirin and aspirin-like medicines -medicines to increase blood counts like filgrastim, pegfilgrastim, sargramostim -methotrexate -NSAIDS, medicines for pain and inflammation, like ibuprofen or naproxen -probenecid -pyrimethamine -vaccines Talk to your doctor or health care professional before taking any of these medicines: -acetaminophen -aspirin -ibuprofen -ketoprofen -naproxen This list may not describe all possible interactions. Give your health care provider a list of all the medicines, herbs, non-prescription drugs, or dietary supplements you use. Also tell them if you smoke, drink alcohol, or use illegal drugs. Some items may interact with your medicine. What should I watch for while using this medicine? Visit your doctor for checks on your progress. This drug may make you feel generally unwell. This is not uncommon, as chemotherapy can affect healthy cells as well as cancer cells. Report any side effects. Continue your course of treatment even though you feel ill  unless your doctor tells you to stop. In some cases, you may be given additional medicines to help with side effects. Follow all directions for their  use. Call your doctor or health care professional for advice if you get a fever, chills or sore throat, or other symptoms of a cold or flu. Do not treat yourself. This drug decreases your body's ability to fight infections. Try to avoid being around people who are sick. This medicine may increase your risk to bruise or bleed. Call your doctor or health care professional if you notice any unusual bleeding. Be careful brushing and flossing your teeth or using a toothpick because you may get an infection or bleed more easily. If you have any dental work done, tell your dentist you are receiving this medicine. Avoid taking products that contain aspirin, acetaminophen, ibuprofen, naproxen, or ketoprofen unless instructed by your doctor. These medicines may hide a fever. Call your doctor or health care professional if you get diarrhea or mouth sores. Do not treat yourself. To protect your kidneys, drink water or other fluids as directed while you are taking this medicine. Men and women must use effective birth control while taking this medicine. You may also need to continue using effective birth control for a time after stopping this medicine. Do not become pregnant while taking this medicine. Tell your doctor right away if you think that you or your partner might be pregnant. There is a potential for serious side effects to an unborn child. Talk to your health care professional or pharmacist for more information. Do not breast-feed an infant while taking this medicine. This medicine may lower sperm counts. What side effects may I notice from receiving this medicine? Side effects that you should report to your doctor or health care professional as soon as possible: -allergic reactions like skin rash, itching or hives, swelling of the face, lips, or tongue -low blood counts - this medicine may decrease the number of white blood cells, red blood cells and platelets. You may be at increased risk for infections  and bleeding. -signs of infection - fever or chills, cough, sore throat, pain or difficulty passing urine -signs of decreased platelets or bleeding - bruising, pinpoint red spots on the skin, black, tarry stools, blood in the urine -signs of decreased red blood cells - unusually weak or tired, fainting spells, lightheadedness -breathing problems, like a dry cough -changes in emotions or moods -chest pain -confusion -diarrhea -high blood pressure -mouth or throat sores or ulcers -pain, swelling, warmth in the leg -pain on swallowing -swelling of the ankles, feet, hands -trouble passing urine or change in the amount of urine -vomiting -yellowing of the eyes or skin Side effects that usually do not require medical attention (report to your doctor or health care professional if they continue or are bothersome): -hair loss -loss of appetite -nausea -stomach upset This list may not describe all possible side effects. Call your doctor for medical advice about side effects. You may report side effects to FDA at 1-800-FDA-1088. Where should I keep my medicine? This drug is given in a hospital or clinic and will not be stored at home. NOTE: This sheet is a summary. It may not cover all possible information. If you have questions about this medicine, talk to your doctor, pharmacist, or health care provider.    2016, [employer]. (2008-03-18 13:24:03)

## 2015-10-13 NOTE — Telephone Encounter (Signed)
per pof to sch pt appt-gave pt copy of avs-sent MW email to sch trmt-pt to get updated copy b4 leaving

## 2015-10-22 ENCOUNTER — Ambulatory Visit: Payer: Self-pay | Admitting: Radiation Oncology

## 2015-10-30 ENCOUNTER — Ambulatory Visit
Admission: RE | Admit: 2015-10-30 | Discharge: 2015-10-30 | Disposition: A | Discharge status: Home / Self Care | Payer: BLUE CROSS/BLUE SHIELD | Source: Ambulatory Visit | Attending: Radiation Oncology | Admitting: Radiation Oncology

## 2015-10-30 DIAGNOSIS — C7931 Secondary malignant neoplasm of brain: Secondary | ICD-10-CM

## 2015-10-30 DIAGNOSIS — C7949 Secondary malignant neoplasm of other parts of nervous system: Principal | ICD-10-CM

## 2015-10-30 MED ORDER — GADOBENATE DIMEGLUMINE 529 MG/ML IV SOLN
14.0000 mL | Freq: Once | INTRAVENOUS | Status: AC | PRN
  Administered 2015-10-30: 14 mL via INTRAVENOUS

## 2015-11-02 ENCOUNTER — Other Ambulatory Visit (HOSPITAL_BASED_OUTPATIENT_CLINIC_OR_DEPARTMENT_OTHER): Payer: BLUE CROSS/BLUE SHIELD

## 2015-11-02 ENCOUNTER — Telehealth: Payer: Self-pay | Admitting: Internal Medicine

## 2015-11-02 ENCOUNTER — Encounter: Payer: Self-pay | Admitting: Radiation Oncology

## 2015-11-02 ENCOUNTER — Ambulatory Visit
Admission: RE | Admit: 2015-11-02 | Discharge: 2015-11-02 | Disposition: A | Discharge status: Home / Self Care | Payer: BLUE CROSS/BLUE SHIELD | Source: Ambulatory Visit | Attending: Radiation Oncology | Admitting: Radiation Oncology

## 2015-11-02 ENCOUNTER — Ambulatory Visit (HOSPITAL_BASED_OUTPATIENT_CLINIC_OR_DEPARTMENT_OTHER): Payer: BLUE CROSS/BLUE SHIELD | Admitting: Internal Medicine

## 2015-11-02 ENCOUNTER — Encounter: Payer: Self-pay | Admitting: Internal Medicine

## 2015-11-02 ENCOUNTER — Encounter: Payer: Self-pay | Admitting: *Deleted

## 2015-11-02 ENCOUNTER — Ambulatory Visit (HOSPITAL_BASED_OUTPATIENT_CLINIC_OR_DEPARTMENT_OTHER): Payer: BLUE CROSS/BLUE SHIELD

## 2015-11-02 VITALS — BP 113/55 | HR 64 | Temp 97.9°F | Resp 18 | Ht 64.0 in | Wt 160.1 lb

## 2015-11-02 VITALS — BP 118/45 | HR 71 | Temp 98.6°F | Resp 20 | Ht 64.0 in | Wt 161.2 lb

## 2015-11-02 DIAGNOSIS — F329 Major depressive disorder, single episode, unspecified: Secondary | ICD-10-CM

## 2015-11-02 DIAGNOSIS — C7931 Secondary malignant neoplasm of brain: Secondary | ICD-10-CM | POA: Diagnosis not present

## 2015-11-02 DIAGNOSIS — C3411 Malignant neoplasm of upper lobe, right bronchus or lung: Secondary | ICD-10-CM

## 2015-11-02 DIAGNOSIS — F419 Anxiety disorder, unspecified: Secondary | ICD-10-CM

## 2015-11-02 DIAGNOSIS — Z5111 Encounter for antineoplastic chemotherapy: Secondary | ICD-10-CM | POA: Diagnosis not present

## 2015-11-02 DIAGNOSIS — F411 Generalized anxiety disorder: Secondary | ICD-10-CM

## 2015-11-02 DIAGNOSIS — C7949 Secondary malignant neoplasm of other parts of nervous system: Secondary | ICD-10-CM

## 2015-11-02 LAB — CBC WITH DIFFERENTIAL/PLATELET
BASO%: 0 % (ref 0.0–2.0)
Basophils Absolute: 0 10e3/uL (ref 0.0–0.1)
EOS%: 0 % (ref 0.0–7.0)
Eosinophils Absolute: 0 10e3/uL (ref 0.0–0.5)
HCT: 37.5 % (ref 34.8–46.6)
HGB: 12 g/dL (ref 11.6–15.9)
LYMPH%: 3 % — ABNORMAL LOW (ref 14.0–49.7)
MCH: 31.9 pg (ref 25.1–34.0)
MCHC: 32 g/dL (ref 31.5–36.0)
MCV: 99.7 fL (ref 79.5–101.0)
MONO#: 0.6 10e3/uL (ref 0.1–0.9)
MONO%: 6.6 % (ref 0.0–14.0)
NEUT#: 8.1 10e3/uL — ABNORMAL HIGH (ref 1.5–6.5)
NEUT%: 90.4 % — ABNORMAL HIGH (ref 38.4–76.8)
Platelets: 251 10e3/uL (ref 145–400)
RBC: 3.76 10e6/uL (ref 3.70–5.45)
RDW: 14.6 % — ABNORMAL HIGH (ref 11.2–14.5)
WBC: 8.9 10e3/uL (ref 3.9–10.3)
lymph#: 0.3 10e3/uL — ABNORMAL LOW (ref 0.9–3.3)

## 2015-11-02 LAB — COMPREHENSIVE METABOLIC PANEL
ALT: <9 U/L (ref 0–55)
AST: 10 U/L (ref 5–34)
Albumin: 3.6 g/dL (ref 3.5–5.0)
Alkaline Phosphatase: 90 U/L (ref 40–150)
Anion Gap: 9 mEq/L (ref 3–11)
BUN: 14.8 mg/dL (ref 7.0–26.0)
CO2: 25 mEq/L (ref 22–29)
Calcium: 9.2 mg/dL (ref 8.4–10.4)
Chloride: 106 mEq/L (ref 98–109)
Creatinine: 0.8 mg/dL (ref 0.6–1.1)
EGFR: 83 mL/min/{1.73_m2} — ABNORMAL LOW (ref >90)
Glucose: 116 mg/dl (ref 70–140)
Potassium: 3.9 mEq/L (ref 3.5–5.1)
Sodium: 141 mEq/L (ref 136–145)
Total Bilirubin: 0.38 mg/dL (ref 0.20–1.20)
Total Protein: 6.9 g/dL (ref 6.4–8.3)

## 2015-11-02 MED ORDER — SODIUM CHLORIDE 0.9 % IV SOLN
500.0000 mg/m2 | Freq: Once | INTRAVENOUS | Status: AC
  Administered 2015-11-02: 900 mg via INTRAVENOUS
  Filled 2015-11-02: qty 32

## 2015-11-02 MED ORDER — PENTOXIFYLLINE ER 400 MG PO TBCR: 400.0000 mg | EXTENDED_RELEASE_TABLET | Freq: Two times a day (BID) | ORAL | Status: DC

## 2015-11-02 MED ORDER — ALPRAZOLAM 0.25 MG PO TABS: 0.2500 mg | ORAL_TABLET | Freq: Three times a day (TID) | ORAL | Status: DC | PRN

## 2015-11-02 MED ORDER — SODIUM CHLORIDE 0.9 % IV SOLN
Freq: Once | INTRAVENOUS | Status: AC
  Administered 2015-11-02: 10:00:00 via INTRAVENOUS
  Filled 2015-11-02: qty 4

## 2015-11-02 MED ORDER — SODIUM CHLORIDE 0.9 % IV SOLN
Freq: Once | INTRAVENOUS | Status: AC
  Administered 2015-11-02: 10:00:00 via INTRAVENOUS

## 2015-11-02 NOTE — Progress Notes (Signed)
Radiation Oncology         [phone] ________________________________  Name: Alexis [name] MRN: [id number]  Date: 11/02/2015  DOB: 1951-02-25  Follow-Up Visit Note  CC: Alexis Roers, MD  Alexis [name], *  Diagnosis:   Metastatic lung cancer with brain metastasis  Interval Since Last Radiation: 1 year and 11 months  10/28/2013 through 12/05/2013: The patient was treated to the right lung tumor to a dose of 50 gray in 25 fractions using a 3-D conformal technique. Daily image guidance was used for the patient's treatment.  10/16/2013: Rightt Parietal 85m target was treated using 3 Arcs to a prescription dose of 20 Gy. ExacTrac Snap verification was performed for each couch angle.   Narrative:  The patient presents for a follow up. CT of the chest/abd/pelvis on 09/18/15 showed that the right upper lobe nodule increased in size from 2.4 x 1.4 cm to 2.5 x 1.7 cm, a new 5 mm diameter sub solid nodule in the left upper lobe, and no specific findings of metastatic disease. MRI of the brain on 10/30/15 showed no change in the treated 1.0 x 1.5 cm enhancing lesion at the right frontoparietal cortical and subcortical brain and there is slightly more adjacent white matter T2 and FLAIR signal consistent with increased surrounding edema. She saw Dr. MJulien Nordmannand had an infusion prior to this encounter. The patient denies pain, headaches, dizziness, or vision changes. She has a good appetite and no fatigue.  ALLERGIES:  is allergic to codeine.  Meds: Current Outpatient Prescriptions  Medication Sig Dispense Refill  . Ascorbic Acid (VITAMIN C GUMMIE PO) Take 1 each by mouth every morning.    .Marland Kitchendexamethasone (DECADRON) 4 MG tablet 4 mg by mouth twice a day the day before, day of and day after the chemotherapy every 3 weeks 40 tablet 1  . folic acid (FOLVITE) 1 MG tablet Take 1 tablet (1 mg total) by mouth daily. 30 tablet 2  . Multiple Vitamin (MULTIVITAMIN WITH MINERALS) TABS tablet Take 1 tablet by  mouth every morning.    .Marland Kitchenomeprazole (PRILOSEC) 20 MG capsule Take 1 capsule (20 mg total) by mouth daily. As needed for reflux or indigestion. 42 capsule PRN  . ondansetron (ZOFRAN) 8 MG tablet Take 1 tablet (8 mg total) by mouth every 8 (eight) hours as needed for nausea or vomiting. 30 tablet 0  . OVER THE COUNTER MEDICATION Take 1 tablet by mouth every morning. (Vitamin A)    . Zinc 50 MG TABS Take 1 tablet by mouth every morning.    .Marland Kitchenacetaminophen (TYLENOL) 500 MG tablet Take 500 mg by mouth every 6 (six) hours as needed for mild pain or headache. Reported on 11/02/2015    . ALPRAZolam (XANAX) 0.25 MG tablet Take 1 tablet (0.25 mg total) by mouth 3 (three) times daily as needed. for anxiety 45 tablet 0  . pentoxifylline (TRENTAL) 400 MG CR tablet TAKE ONE TABLET BY MOUTH TWICE DAILY, ALSO  TAKE  VITAMIN  E  400  UNITS  TWICE  DAILY  WITH  PENTOXIFYLLINE 60 tablet 0  . pentoxifylline (TRENTAL) 400 MG CR tablet Take 1 tablet (400 mg total) by mouth 2 (two) times daily. Patient is also to take vitamin E 400 mg BID with trental. 60 tablet 3  . prochlorperazine (COMPAZINE) 10 MG tablet Take 1 tablet (10 mg total) by mouth every 6 (six) hours as needed for nausea or vomiting. 60 tablet 0  . senna-docusate (SENOKOT-S) 8.6-50 MG tablet  Take 1 tablet by mouth daily. 30 tablet prn   No current facility-administered medications for this encounter.    Physical Findings: The patient is in no acute distress. Patient is alert and oriented.  height is '5\' 4"'$  (1.626 m) and weight is 161 lb 3.2 oz (73.12 kg). Her oral temperature is 98.6 F (37 C). Her blood pressure is 118/45 and her pulse is 71. Her respiration is 20.  In no respiratory distress.  Lab Findings: Lab Results  Component Value Date   WBC 8.9 11/02/2015   HGB 12.0 11/02/2015   HCT 37.5 11/02/2015   MCV 99.7 11/02/2015   PLT 251 11/02/2015     Radiographic Findings: Mr Alexis [name] RJ Contrast  10/30/2015  CLINICAL DATA:  S RS for right  parietal brain metastasis. Being treated with chemotherapy. Follow-up. EXAM: MRI HEAD WITHOUT AND WITH CONTRAST TECHNIQUE: Multiplanar, multiecho pulse sequences of the brain and surrounding structures were obtained without and with intravenous contrast. CONTRAST:  56m MULTIHANCE GADOBENATE DIMEGLUMINE 529 MG/ML IV SOLN COMPARISON:  07/17/2015.  04/09/2015.  01/09/2015. FINDINGS: No change since the previous study. Peripherally enhancing 10 x 15 mm lesion in the right frontoparietal cortical and subcortical brain is unchanged. Amount of adjacent edema/ gliosis is slightly more prominent. No second enhancing brain lesion is present. Chronic small-vessel ischemic changes affect the pons. No focal cerebellar insult. 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 Punctate focus of hemosiderin deposition in the left anterior thalamus and in the right frontoparietal lesion are unchanged. No large vessel territory insult. No hydrocephalus or extra-axial collection. No calvarial lesion. No significant sinus disease. IMPRESSION: No change in the treated 10 x 15 mm enhancing lesion at the right frontoparietal cortical and subcortical brain. Slightly more adjacent white matter T2 and FLAIR signal consistent with increased surrounding edema. This could be secondary to treatment effect. Electronically Signed   By: MNelson ChimesM.D.   On: 10/30/2015 14:18   Impression: No sign of progression of the disease on the MRI scan. No new disease seen. Can continue standard operation for brain metastases.   Plan: Brain MRI and follow up in 4 months. Advised to continue taking Trental/Vitamin E. I refilled the patient's Trental.  ------------------------------------------------  [name], [name]  This document serves as a record of services personally performed by JKyung Rudd MD. It was created on his behalf by  JDarcus Austin a trained medical scribe. The creation of this record is based on the scribe's personal observations and the provider's statements to them. This document has been checked and approved by the attending provider.

## 2015-11-02 NOTE — Telephone Encounter (Signed)
Gave and printed appt sched and avs for pt for March thru May....gv barium

## 2015-11-02 NOTE — Patient Instructions (Signed)
[facility] Discharge Instructions for Patients Receiving Chemotherapy  Today you received the following chemotherapy agents Alimta.  To help prevent nausea and vomiting after your treatment, we encourage you to take your nausea medication as prescribed.   If you develop nausea and vomiting that is not controlled by your nausea medication, call the clinic.   BELOW ARE SYMPTOMS THAT SHOULD BE REPORTED IMMEDIATELY:  *FEVER GREATER THAN 100.5 F  *CHILLS WITH OR WITHOUT FEVER  NAUSEA AND VOMITING THAT IS NOT CONTROLLED WITH YOUR NAUSEA MEDICATION  *UNUSUAL SHORTNESS OF BREATH  *UNUSUAL BRUISING OR BLEEDING  TENDERNESS IN MOUTH AND THROAT WITH OR WITHOUT PRESENCE OF ULCERS  *URINARY PROBLEMS  *BOWEL PROBLEMS  UNUSUAL RASH Items with * indicate a potential emergency and should be followed up as soon as possible.  Feel free to call the clinic you have any questions or concerns. The clinic phone number is [phone].  Please show the CHEMO ALERT CARD at check-in to the Emergency Department and triage nurse.   

## 2015-11-02 NOTE — Progress Notes (Signed)
FOLLOW UP S/P BRAIN srs, mri 10/30/15 RESULTS IN, PATIENT denies heada cahes                                                                                  Follow up s/p SRS Brain , here to discuss MMRI of the Brain results 10/30/15, no head aches, dizzy ness,pain or vision changes, appetite great, no fatigue, went to get lab, see Med Onc and infusion will be back at 11009 BP 118/45 mmHg  Pulse 71  Temp(Src) 98.6 F (37 C) (Oral)  Resp 20  Ht '5\' 4"'$  (1.626 m)  Wt 161 lb 3.2 oz (73.12 kg)  BMI 27.66 kg/m2  Wt Readings from Last 3 Encounters:  11/02/15 161 lb 3.2 oz (73.12 kg)  10/13/15 158 lb 14.4 oz (72.077 kg)  09/21/15 158 lb 6.4 oz (71.85 kg)   8:39 AM

## 2015-11-02 NOTE — Progress Notes (Signed)
Oncology Nurse Navigator Documentation  Oncology Nurse Navigator Flowsheets 11/02/2015  Navigator Location [facility]-Med Onc  Navigator Encounter Type Clinic/MDC/spoke to patient and husband today.  Ms. Mcafee is doing well without complaints.  She is tolerating treatment without concerns  Patient Visit Type Follow-up;MedOnc  Treatment Phase Treatment  Barriers/Navigation Needs No barriers at this time  Acuity Level 1  Time Spent with Patient 15

## 2015-11-02 NOTE — Progress Notes (Signed)
Follow up s/p SRS Brain, here to discuss MRI brain 10/30/15, will see MD after lab, Med ONC and infusion ,will be back here at 1100 am,. No pain, no head aches,dizzy ness, or vision changes, appetite great no fatigue BP 118/45 mmHg  Pulse 71  Temp(Src) 98.6 F (37 C) (Oral)  Resp 20  Ht '5\' 4"'$  (1.626 m)  Wt 161 lb 3.2 oz (73.12 kg)  BMI 27.66 kg/m2  . Wt Readings from Last 3 Encounters:  11/02/15 161 lb 3.2 oz (73.12 kg)  10/13/15 158 lb 14.4 oz (72.077 kg)  09/21/15 158 lb 6.4 oz (71.85 kg)

## 2015-11-02 NOTE — Progress Notes (Signed)
[place] [phone]   Fax:([phone]) [place], MD [address] [place] [address]  DIAGNOSIS: Stage IV (T2a, N0, M1b) non-small cell lung cancer consistent with adenocarcinoma with negative EGFR mutation and negative ALK gene translocation diagnosed in January of 2015 presented with right upper lobe lung mass in addition to a solitary brain metastasis status post stereotactic to a solitary brain metastasis as well as radiotherapy to the right upper lobe lung mass.  PRIOR THERAPY:  1) Status post stereotactic radiotherapy to a solitary right parietal brain lesion under the care of Dr. [name] on 10/16/2013. 2) Status post palliative radiotherapy to the right lung tumor under the care of Dr. [name] completed on 12/05/2013. 3) Systemic chemotherapy with carboplatin for AUC of 5 and Alimta 500 mg/M2 every 3 weeks. First dose Jan 06 2014. Status post 6 cycles.  CURRENT THERAPY: Systemic chemotherapy with maintenance Alimta 500 mg/M2 every 3 weeks. Status post 25 cycles.  INTERVAL HISTORY: Alexis [name] 65 y.o. female returns to the clinic today for 3 weeks followup visit accompanied by her husband. The patient is feeling fine today and denied having any specific complaints except for insomnia and she is requesting refill of Xanax. She status post 24 cycles of maintenance chemotherapy. She is tolerating her maintenance chemotherapy with Alimta fairly well. She denied having any significant chest pain, shortness of breath, cough or hemoptysis. She has no fever or chills, no nausea or vomiting. She denied having any significant weight loss or night sweats. She had a recent MRI of the brain that showed no concerning findings for disease progression in the brain. She is here today for evaluation before starting cycle #25.   MEDICAL HISTORY: Past Medical History  Diagnosis Date  . Ileus, postoperative 11/18/2013  . Physical  deconditioning 11/18/2013  . Severe protein-calorie malnutrition ([place]) 11/18/2013  . Anemia 11/18/2013  . History of radiation therapy 10-28-13- 12-05-13    lung ca 50 Gy/58f  . Anxiety   . Hx of radiation therapy 10/16/13    rt parietal brain/20Gy  . 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 Encounter for antineoplastic chemotherapy 07/20/2015    ALLERGIES:  is allergic to codeine.  MEDICATIONS:  Current Outpatient Prescriptions  Medication Sig Dispense Refill  . acetaminophen (TYLENOL) 500 MG tablet Take 500 mg by mouth every 6 (six) hours as needed for mild pain or headache. Reported on 11/02/2015    . ALPRAZolam (XANAX) 0.25 MG tablet Take 1 tablet (0.25 mg total) by mouth 3 (three) times daily as needed. for anxiety 45 tablet 0  . Ascorbic Acid (VITAMIN C GUMMIE PO) Take 1 each by mouth every morning.    .Marland Kitchendexamethasone (DECADRON) 4 MG tablet 4 mg by mouth twice a day the day before, day of and day after the chemotherapy every 3 weeks 40 tablet 1  . folic acid (FOLVITE) 1 MG tablet Take 1 tablet (1 mg total) by mouth daily. 30 tablet 2  . Multiple Vitamin (MULTIVITAMIN WITH MINERALS) TABS tablet Take 1 tablet by mouth every morning.    .Marland Kitchenomeprazole (PRILOSEC) 20 MG capsule Take 1 capsule (20 mg total) by mouth daily. As needed for reflux or indigestion. 42 capsule PRN  . ondansetron (ZOFRAN) 8 MG tablet Take 1 tablet (8 mg total) by mouth every 8 (eight) hours as needed for nausea or vomiting.  30 tablet 0  . OVER THE COUNTER MEDICATION Take 1 tablet by mouth every morning. (Vitamin A)    . pentoxifylline (TRENTAL) 400 MG CR tablet Take 1 tablet (400 mg total) by mouth 2 (two) times daily. Patient is also to take vitamin E 400 mg BID with trental. 60 tablet 3  . pentoxifylline (TRENTAL) 400 MG CR tablet TAKE ONE TABLET BY MOUTH TWICE DAILY, ALSO  TAKE  VITAMIN  E  400  UNITS  TWICE  DAILY  WITH  PENTOXIFYLLINE 60 tablet 0  . prochlorperazine  (COMPAZINE) 10 MG tablet Take 1 tablet (10 mg total) by mouth every 6 (six) hours as needed for nausea or vomiting. 60 tablet 0  . senna-docusate (SENOKOT-S) 8.6-50 MG tablet Take 1 tablet by mouth daily. 30 tablet prn  . Zinc 50 MG TABS Take 1 tablet by mouth every morning.     No current facility-administered medications for this visit.    SURGICAL HISTORY:  Past Surgical History  Procedure Laterality Date  . Video bronchoscopy Bilateral 08/30/2013    Procedure: VIDEO BRONCHOSCOPY WITH FLUORO;  Surgeon: Tanda Rockers, MD;  Location: [facility];  Service: Cardiopulmonary;  Laterality: Bilateral;  . Abdominal hysterectomy    . Laparotomy N/A 11/03/2013    Procedure: EXPLORATORY LAPAROTOMY, DRAINAGE OF INTRA  ABDOMINAL ABSCESSES, MOBILIZATION OF SPLENIC FLEXURE, SIGMOID COLECTOMY WITH COLOSTOMY;  Surgeon: Odis Hollingshead, MD;  Location: [facility];  Service: General;  Laterality: N/A;  . Colostomy takedown N/A 07/10/2014    Procedure: LAPAROSCOPIC LYSIS OF ADHESIONS (90 MIN) LAPAROSCOPIC ASSISTED COLOSTOMY CLOSURE, RIGID PROCTOSIGMOIDOSCOPY;  Surgeon: Jackolyn Confer, MD;  Location: [facility];  Service: General;  Laterality: N/A;    REVIEW OF SYSTEMS:  A comprehensive review of systems was negative.   PHYSICAL EXAMINATION: General appearance: alert, cooperative, fatigued and no distress Head: Normocephalic, without obvious abnormality, atraumatic Neck: no adenopathy, no JVD, supple, symmetrical, trachea midline and thyroid not enlarged, symmetric, no tenderness/mass/nodules Lymph nodes: Cervical, supraclavicular, and axillary nodes normal. Resp: clear to auscultation bilaterally Back: symmetric, no curvature. ROM normal. No CVA tenderness. Cardio: regular rate and rhythm, S1, S2 normal, no murmur, click, rub or gallop GI: soft, non-tender; bowel sounds normal; no masses,  no organomegaly Extremities: extremities normal, atraumatic, no cyanosis or edema Neurologic: Alert and oriented X 3,  normal strength and tone. Normal symmetric reflexes. Normal coordination and gait  ECOG PERFORMANCE STATUS: 0 - Asymptomatic  Blood pressure 113/55, pulse 64, temperature 97.9 F (36.6 C), temperature source Oral, resp. rate 18, height '5\' 4"'$  (1.626 m), weight 160 lb 1.6 oz (72.621 kg), SpO2 100 %.  LABORATORY DATA: Lab Results  Component Value Date   WBC 8.9 11/02/2015   HGB 12.0 11/02/2015   HCT 37.5 11/02/2015   MCV 99.7 11/02/2015   PLT 251 11/02/2015      Chemistry      Component Value Date/Time   NA 140 10/13/2015 0836   NA 139 07/12/2014 0512   K 3.9 10/13/2015 0836   K 3.9 07/12/2014 0512   CL 100 07/12/2014 0512   CO2 24 10/13/2015 0836   CO2 32 07/12/2014 0512   BUN 14.1 10/13/2015 0836   BUN 5* 07/12/2014 0512   CREATININE 0.9 10/13/2015 0836   CREATININE 0.55 07/12/2014 0512      Component Value Date/Time   CALCIUM 9.6 10/13/2015 0836   CALCIUM 9.0 07/12/2014 0512   ALKPHOS 100 10/13/2015 0836   ALKPHOS 91 07/10/2014 0955   AST 10 10/13/2015 0836  AST 15 07/10/2014 0955   ALT 11 10/13/2015 0836   ALT 8 07/10/2014 0955   BILITOT 0.38 10/13/2015 0836   BILITOT 0.4 07/10/2014 0955       RADIOGRAPHIC STUDIES: Mr [name] Wo Contrast  10/31/2015  CLINICAL DATA:  S RS for right parietal brain metastasis. Being treated with chemotherapy. Follow-up. EXAM: MRI HEAD WITHOUT AND WITH CONTRAST TECHNIQUE: Multiplanar, multiecho pulse sequences of the brain and surrounding structures were obtained without and with intravenous contrast. CONTRAST:  60m MULTIHANCE GADOBENATE DIMEGLUMINE 529 MG/ML IV SOLN COMPARISON:  07/17/2015.  04/09/2015.  01/09/2015. FINDINGS: No change since the previous study. Peripherally enhancing 10 x 15 mm lesion in the right frontoparietal cortical and subcortical brain is unchanged. Amount of adjacent edema/ gliosis is slightly more prominent. No second enhancing brain lesion is present. Chronic small-vessel ischemic changes affect the pons. No  focal cerebellar insult. 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 Punctate focus of hemosiderin deposition in the left anterior thalamus and in the right frontoparietal lesion are unchanged. No large vessel territory insult. No hydrocephalus or extra-axial collection. No calvarial lesion. No significant sinus disease. IMPRESSION: No change in the treated 10 x 15 mm enhancing lesion at the right frontoparietal cortical and subcortical brain. Slightly more adjacent white matter T2 and FLAIR signal consistent with increased surrounding edema. This could be secondary to treatment effect. Electronically Signed   By: MNelson ChimesM.D.   On: 003/04/201714:18    ASSESSMENT AND PLAN: This is a very pleasant 65years old white female recently diagnosed with a stage IV non-small cell lung cancer, adenocarcinoma with negative EGFR mutation and negative ALK gene translocation. The patient is status post stereotactic radiotherapy to a solitary brain metastases in addition to palliative radiotherapy to the right upper lobe lung mass. She status post 6 cycles of systemic chemotherapy with carboplatin and Alimta with improvement in her disease and she is currently undergoing maintenance chemotherapy with single agent Alimta status post 24 cycles and tolerating her treatment fairly well. I recommended for her to continue her current maintenance treatment with single agent Alimta 500 mg/M2 every 3 weeks. She will proceed with cycle #25 today as scheduled. For history of depression, the patient on Remeron 30 mg by mouth each bedtime. For anxiety, she would continue on Xanax 0.5 mg by mouth. She was given a refill of this medication. She will come back for follow-up visit in 3 weeks for reevaluation before proceeding with cycle #26 after repeating CT scan of the chest, abdomen and pelvis for restaging of her disease.   The patient was advised to call immediately if she has any concerning symptoms in the interval. The patient voices understanding of current disease status and treatment options and is in agreement with the current care plan.  All questions were answered. The patient knows to call the clinic with any problems, questions or concerns. We can certainly see the patient much sooner if necessary.  Disclaimer: This note was dictated with voice recognition software. Similar sounding words can inadvertently be transcribed and may not be corrected upon review.

## 2015-11-03 ENCOUNTER — Other Ambulatory Visit: Payer: Self-pay | Admitting: Internal Medicine

## 2015-11-03 ENCOUNTER — Other Ambulatory Visit: Payer: Self-pay | Admitting: *Deleted

## 2015-11-03 DIAGNOSIS — C7949 Secondary malignant neoplasm of other parts of nervous system: Principal | ICD-10-CM

## 2015-11-03 DIAGNOSIS — C7931 Secondary malignant neoplasm of brain: Secondary | ICD-10-CM

## 2015-11-03 DIAGNOSIS — C3411 Malignant neoplasm of upper lobe, right bronchus or lung: Secondary | ICD-10-CM

## 2015-11-03 MED ORDER — FOLIC ACID 1 MG PO TABS: 1.0000 mg | ORAL_TABLET | Freq: Every day | ORAL | Status: DC

## 2015-11-11 ENCOUNTER — Other Ambulatory Visit: Payer: Self-pay | Admitting: Internal Medicine

## 2015-11-19 ENCOUNTER — Encounter (HOSPITAL_COMMUNITY): Payer: Self-pay

## 2015-11-19 ENCOUNTER — Ambulatory Visit (HOSPITAL_COMMUNITY)
Admission: RE | Admit: 2015-11-19 | Discharge: 2015-11-19 | Disposition: A | Discharge status: Home / Self Care | Payer: BLUE CROSS/BLUE SHIELD | Source: Ambulatory Visit | Attending: Internal Medicine | Admitting: Internal Medicine

## 2015-11-19 DIAGNOSIS — F411 Generalized anxiety disorder: Secondary | ICD-10-CM | POA: Insufficient documentation

## 2015-11-19 DIAGNOSIS — R590 Localized enlarged lymph nodes: Secondary | ICD-10-CM | POA: Diagnosis not present

## 2015-11-19 DIAGNOSIS — C7949 Secondary malignant neoplasm of other parts of nervous system: Secondary | ICD-10-CM | POA: Insufficient documentation

## 2015-11-19 DIAGNOSIS — C3411 Malignant neoplasm of upper lobe, right bronchus or lung: Secondary | ICD-10-CM | POA: Diagnosis not present

## 2015-11-19 DIAGNOSIS — J439 Emphysema, unspecified: Secondary | ICD-10-CM | POA: Diagnosis not present

## 2015-11-19 DIAGNOSIS — K760 Fatty (change of) liver, not elsewhere classified: Secondary | ICD-10-CM | POA: Insufficient documentation

## 2015-11-19 DIAGNOSIS — C7931 Secondary malignant neoplasm of brain: Secondary | ICD-10-CM | POA: Diagnosis present

## 2015-11-19 MED ORDER — IOPAMIDOL (ISOVUE-300) INJECTION 61%
100.0000 mL | Freq: Once | INTRAVENOUS | Status: AC | PRN
  Administered 2015-11-19: 100 mL via INTRAVENOUS

## 2015-11-23 ENCOUNTER — Other Ambulatory Visit: Payer: BLUE CROSS/BLUE SHIELD

## 2015-11-23 ENCOUNTER — Ambulatory Visit (HOSPITAL_BASED_OUTPATIENT_CLINIC_OR_DEPARTMENT_OTHER): Payer: BLUE CROSS/BLUE SHIELD

## 2015-11-23 ENCOUNTER — Other Ambulatory Visit (HOSPITAL_BASED_OUTPATIENT_CLINIC_OR_DEPARTMENT_OTHER): Payer: BLUE CROSS/BLUE SHIELD

## 2015-11-23 ENCOUNTER — Ambulatory Visit: Payer: BLUE CROSS/BLUE SHIELD | Admitting: Internal Medicine

## 2015-11-23 ENCOUNTER — Encounter: Payer: Self-pay | Admitting: Internal Medicine

## 2015-11-23 ENCOUNTER — Ambulatory Visit: Payer: BLUE CROSS/BLUE SHIELD

## 2015-11-23 ENCOUNTER — Ambulatory Visit (HOSPITAL_BASED_OUTPATIENT_CLINIC_OR_DEPARTMENT_OTHER): Payer: BLUE CROSS/BLUE SHIELD | Admitting: Internal Medicine

## 2015-11-23 VITALS — BP 121/70 | HR 71 | Temp 98.2°F | Resp 18 | Ht 64.0 in | Wt 163.6 lb

## 2015-11-23 VITALS — BP 111/55 | HR 74 | Temp 98.6°F | Resp 18

## 2015-11-23 DIAGNOSIS — C3411 Malignant neoplasm of upper lobe, right bronchus or lung: Secondary | ICD-10-CM

## 2015-11-23 DIAGNOSIS — C7931 Secondary malignant neoplasm of brain: Secondary | ICD-10-CM

## 2015-11-23 DIAGNOSIS — Z5111 Encounter for antineoplastic chemotherapy: Secondary | ICD-10-CM | POA: Diagnosis not present

## 2015-11-23 DIAGNOSIS — F411 Generalized anxiety disorder: Secondary | ICD-10-CM

## 2015-11-23 DIAGNOSIS — C7949 Secondary malignant neoplasm of other parts of nervous system: Principal | ICD-10-CM

## 2015-11-23 LAB — CBC WITH DIFFERENTIAL/PLATELET
BASO%: 0.2 % (ref 0.0–2.0)
Basophils Absolute: 0 10*3/uL (ref 0.0–0.1)
EOS%: 0 % (ref 0.0–7.0)
Eosinophils Absolute: 0 10*3/uL (ref 0.0–0.5)
HCT: 36.4 % (ref 34.8–46.6)
HGB: 12.1 g/dL (ref 11.6–15.9)
LYMPH%: 3.2 % — ABNORMAL LOW (ref 14.0–49.7)
MCH: 32.6 pg (ref 25.1–34.0)
MCHC: 33.3 g/dL (ref 31.5–36.0)
MCV: 98 fL (ref 79.5–101.0)
MONO#: 0.5 10*3/uL (ref 0.1–0.9)
MONO%: 4.4 % (ref 0.0–14.0)
NEUT#: 10 10*3/uL — ABNORMAL HIGH (ref 1.5–6.5)
NEUT%: 92.2 % — ABNORMAL HIGH (ref 38.4–76.8)
Platelets: 307 10*3/uL (ref 145–400)
RBC: 3.71 10*6/uL (ref 3.70–5.45)
RDW: 14.3 % (ref 11.2–14.5)
WBC: 10.9 10*3/uL — ABNORMAL HIGH (ref 3.9–10.3)
lymph#: 0.3 10*3/uL — ABNORMAL LOW (ref 0.9–3.3)

## 2015-11-23 LAB — COMPREHENSIVE METABOLIC PANEL
ALT: 17 U/L (ref 0–55)
AST: 11 U/L (ref 5–34)
Albumin: 3.4 g/dL — ABNORMAL LOW (ref 3.5–5.0)
Alkaline Phosphatase: 87 U/L (ref 40–150)
Anion Gap: 9 mEq/L (ref 3–11)
BUN: 15.2 mg/dL (ref 7.0–26.0)
CO2: 25 mEq/L (ref 22–29)
Calcium: 9.4 mg/dL (ref 8.4–10.4)
Chloride: 106 mEq/L (ref 98–109)
Creatinine: 0.8 mg/dL (ref 0.6–1.1)
EGFR: 82 mL/min/{1.73_m2} — ABNORMAL LOW (ref >90)
Glucose: 121 mg/dl (ref 70–140)
Potassium: 4.1 mEq/L (ref 3.5–5.1)
Sodium: 141 mEq/L (ref 136–145)
Total Bilirubin: 0.33 mg/dL (ref 0.20–1.20)
Total Protein: 7 g/dL (ref 6.4–8.3)

## 2015-11-23 MED ORDER — SODIUM CHLORIDE 0.9 % IV SOLN
Freq: Once | INTRAVENOUS | Status: AC
  Administered 2015-11-23: 10:00:00 via INTRAVENOUS
  Filled 2015-11-23: qty 4

## 2015-11-23 MED ORDER — PROCHLORPERAZINE MALEATE 10 MG PO TABS: 10.0000 mg | ORAL_TABLET | Freq: Four times a day (QID) | ORAL | Status: AC | PRN

## 2015-11-23 MED ORDER — SODIUM CHLORIDE 0.9 % IV SOLN
500.0000 mg/m2 | Freq: Once | INTRAVENOUS | Status: AC
  Administered 2015-11-23: 900 mg via INTRAVENOUS
  Filled 2015-11-23: qty 32

## 2015-11-23 MED ORDER — SODIUM CHLORIDE 0.9 % IV SOLN
Freq: Once | INTRAVENOUS | Status: AC
  Administered 2015-11-23: 10:00:00 via INTRAVENOUS

## 2015-11-23 NOTE — Progress Notes (Signed)
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  PRIOR THERAPY:  1) Status post stereotactic radiotherapy to a solitary right parietal brain lesion under the care of Dr. [name] on 10/16/2013. 2) Status post palliative radiotherapy to the right lung tumor under the care of Dr. [name] completed on 12/05/2013. 3) Systemic chemotherapy with carboplatin for AUC of 5 and Alimta 500 mg/M2 every 3 weeks. First dose Jan 06 2014. Status post 6 cycles.  CURRENT THERAPY: Systemic chemotherapy with maintenance Alimta 500 mg/M2 every 3 weeks. Status post 25 cycles.  INTERVAL HISTORY: Alexis [name] 65 y.o. female returns to the clinic today for 3 weeks followup visit accompanied by her husband. The patient is feeling fine today and denied having any specific complaints. She status post 25 cycles of maintenance chemotherapy. She is tolerating her maintenance chemotherapy with Alimta fairly well. She denied having any significant chest pain, shortness of breath, cough or hemoptysis. She has no fever or chills, no nausea or vomiting. She denied having any significant weight loss or night sweats. She had repeat CT scan of the chest, abdomen and pelvis performed recently and she is here for evaluation and discussion of her scan results. She will also requesting refill of Compazine.  MEDICAL HISTORY: Past Medical History  Diagnosis Date  . Ileus, postoperative 11/18/2013  . Physical deconditioning 11/18/2013  . Severe protein-calorie  malnutrition ([place]) 11/18/2013  . Anemia 11/18/2013  . History of radiation therapy 10-28-13- 12-05-13    lung ca 50 Gy/3f  . Anxiety   . Hx of radiation therapy 10/16/13    rt parietal brain/20Gy  . Encounter for antineoplastic chemotherapy 07/20/2015  . 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  MEDICATIONS:  Current Outpatient Prescriptions  Medication Sig Dispense Refill  . acetaminophen (TYLENOL) 500 MG tablet Take 500 mg by mouth every 6 (six) hours as needed for mild pain or headache. Reported on 11/02/2015    . ALPRAZolam (XANAX) 0.25 MG tablet Take 1 tablet (0.25 mg total) by mouth 3 (three) times daily as needed. for anxiety 45 tablet 0  . Ascorbic Acid (VITAMIN C GUMMIE PO) Take 1 each by mouth every morning.    .Marland Kitchendexamethasone (DECADRON) 4 MG tablet 4 mg by mouth twice a day the day before, day of and day after the chemotherapy every 3 weeks 40 tablet 1  . folic acid (FOLVITE) 1 MG tablet Take 1 tablet (1 mg total) by mouth daily. 30 tablet 2  . folic acid (FOLVITE) 1 MG tablet TAKE 1 TABLET (1 MG TOTAL) BY MOUTH DAILY. 30 tablet 0  . Multiple Vitamin (MULTIVITAMIN WITH MINERALS) TABS tablet Take 1 tablet by mouth every morning.    .Marland Kitchenomeprazole (PRILOSEC) 20 MG capsule Take 1 capsule (20 mg total) by mouth daily. As needed for reflux or indigestion. 42 capsule PRN  . ondansetron (ZOFRAN) 8 MG tablet Take 1 tablet (8 mg total)  by mouth every 8 (eight) hours as needed for nausea or vomiting. 30 tablet 0  . OVER THE COUNTER MEDICATION Take 1 tablet by mouth every morning. (Vitamin A)    . pentoxifylline (TRENTAL) 400 MG CR tablet TAKE ONE TABLET BY MOUTH TWICE DAILY, ALSO  TAKE  VITAMIN  E  400  UNITS  TWICE  DAILY  WITH  PENTOXIFYLLINE 60 tablet 0  . pentoxifylline (TRENTAL) 400 MG CR tablet Take 1 tablet (400 mg total) by mouth 2 (two) times daily. Patient is also to take vitamin E 400 mg BID with  trental. 60 tablet 3  . prochlorperazine (COMPAZINE) 10 MG tablet Take 1 tablet (10 mg total) by mouth every 6 (six) hours as needed for nausea or vomiting. 60 tablet 0  . senna-docusate (SENOKOT-S) 8.6-50 MG tablet Take 1 tablet by mouth daily. 30 tablet prn  . Zinc 50 MG TABS Take 1 tablet by mouth every morning.     No current facility-administered medications for this visit.    SURGICAL HISTORY:  Past Surgical History  Procedure Laterality Date  . Video bronchoscopy Bilateral 08/30/2013    Procedure: VIDEO BRONCHOSCOPY WITH FLUORO;  Surgeon: Tanda Rockers, MD;  Location: [facility];  Service: Cardiopulmonary;  Laterality: Bilateral;  . Abdominal hysterectomy    . Laparotomy N/A 11/03/2013    Procedure: EXPLORATORY LAPAROTOMY, DRAINAGE OF INTRA  ABDOMINAL ABSCESSES, MOBILIZATION OF SPLENIC FLEXURE, SIGMOID COLECTOMY WITH COLOSTOMY;  Surgeon: Odis Hollingshead, MD;  Location: [facility];  Service: General;  Laterality: N/A;  . Colostomy takedown N/A 07/10/2014    Procedure: LAPAROSCOPIC LYSIS OF ADHESIONS (90 MIN) LAPAROSCOPIC ASSISTED COLOSTOMY CLOSURE, RIGID PROCTOSIGMOIDOSCOPY;  Surgeon: Jackolyn Confer, MD;  Location: [facility];  Service: General;  Laterality: N/A;    REVIEW OF SYSTEMS:  Constitutional: negative Eyes: negative Ears, nose, mouth, throat, and face: negative Respiratory: negative Cardiovascular: negative Gastrointestinal: negative Genitourinary:negative Integument/breast: negative Hematologic/lymphatic: negative Musculoskeletal:negative Neurological: negative Behavioral/Psych: negative Endocrine: negative Allergic/Immunologic: negative   PHYSICAL EXAMINATION: General appearance: alert, cooperative, fatigued and no distress Head: Normocephalic, without obvious abnormality, atraumatic Neck: no adenopathy, no JVD, supple, symmetrical, trachea midline and thyroid not enlarged, symmetric, no tenderness/mass/nodules Lymph nodes: Cervical, supraclavicular, and axillary  nodes normal. Resp: clear to auscultation bilaterally Back: symmetric, no curvature. ROM normal. No CVA tenderness. Cardio: regular rate and rhythm, S1, S2 normal, no murmur, click, rub or gallop GI: soft, non-tender; bowel sounds normal; no masses,  no organomegaly Extremities: extremities normal, atraumatic, no cyanosis or edema Neurologic: Alert and oriented X 3, normal strength and tone. Normal symmetric reflexes. Normal coordination and gait  ECOG PERFORMANCE STATUS: 0 - Asymptomatic  There were no vitals taken for this visit.  LABORATORY DATA: Lab Results  Component Value Date   WBC 8.9 11/02/2015   HGB 12.0 11/02/2015   HCT 37.5 11/02/2015   MCV 99.7 11/02/2015   PLT 251 11/02/2015      Chemistry      Component Value Date/Time   NA 141 11/02/2015 0901   NA 139 07/12/2014 0512   K 3.9 11/02/2015 0901   K 3.9 07/12/2014 0512   CL 100 07/12/2014 0512   CO2 25 11/02/2015 0901   CO2 32 07/12/2014 0512   BUN 14.8 11/02/2015 0901   BUN 5* 07/12/2014 0512   CREATININE 0.8 11/02/2015 0901   CREATININE 0.55 07/12/2014 0512      Component Value Date/Time   CALCIUM 9.2 11/02/2015 0901   CALCIUM 9.0 07/12/2014 0512   ALKPHOS 90 11/02/2015  6010   XNATFTD 32 07/10/2014 0955   AST 10 11/02/2015 0901   AST 15 07/10/2014 0955   ALT <9 11/02/2015 0901   ALT 8 07/10/2014 0955   BILITOT 0.38 11/02/2015 0901   BILITOT 0.4 07/10/2014 0955       RADIOGRAPHIC STUDIES: Ct Chest W Contrast  11/19/2015  CLINICAL DATA:  65 year old female with history of right upper lobe lung cancer diagnosed in 2014 with metastatic disease to the brain diagnosed in 2015, status post radiation therapy to the brain completed in December 2016. Additional history of radiation therapy to the lung, now complete. Followup study. EXAM: CT CHEST, ABDOMEN, AND PELVIS WITH CONTRAST TECHNIQUE: Multidetector CT imaging of the chest, abdomen and pelvis was performed following the standard protocol during bolus  administration of intravenous contrast. CONTRAST:  120m ISOVUE-300 IOPAMIDOL (ISOVUE-300) INJECTION 61% COMPARISON:  Multiple priors, most recently CT of the chest, abdomen and pelvis 09/18/2015. FINDINGS: CT CHEST FINDINGS Mediastinum/Lymph Nodes: Heart size is normal. There is no significant pericardial fluid, thickening or pericardial calcification. Right hilar lymph node is mildly enlarged measuring 1 cm in short axis (image 26 of series 2), previously 9 mm. No other enlarged mediastinal or left hilar lymph nodes are noted. Esophagus is unremarkable in appearance. No axillary lymphadenopathy. Lungs/Pleura: Previously noted nodule in the apex of the right upper lobe is essentially stable in size, currently measuring 2.5 x 1.5 cm (image 13 of series 5). There continues to be surrounding architectural distortion right upper lobe and superior segment of the right lower lobe, compatible with chronic postradiation changes. Previously noted 5 mm ground-glass attenuation left upper lobe nodule has resolved. No new suspicious appearing pulmonary nodules or masses are otherwise noted. No acute consolidative airspace disease. No pleural effusions. Left apical nodular pleuroparenchymal thickening, most compatible with chronic post infectious or inflammatory scarring is unchanged. Mild diffuse bronchial wall thickening with mild centrilobular and mild-to-moderate paraseptal emphysema, most apparent the lung apices. Musculoskeletal/Soft Tissues: There are no aggressive appearing lytic or blastic lesions noted in the visualized portions of the skeleton. CT ABDOMEN AND PELVIS FINDINGS Hepatobiliary: Mild diffuse decreased attenuation throughout the hepatic parenchyma, compatible hepatic steatosis. No suspicious cystic or solid hepatic lesions. No intra or extrahepatic biliary ductal dilatation. The appearance of the gallbladder is normal. Pancreas: No pancreatic mass. No pancreatic ductal dilatation. No pancreatic or  peripancreatic fluid or inflammatory changes. Spleen: Unremarkable. Adrenals/Urinary Tract: Bilateral kidneys and bilateral adrenal glands are normal in appearance. No hydroureteronephrosis or perinephric stranding to indicate urinary tract obstruction at this time. Urinary bladder is normal in appearance. Stomach/Bowel: The appearance of the stomach is normal. There is no pathologic dilatation of small bowel or colon. Normal appendix. Vascular/Lymphatic: Atherosclerosis throughout the abdominal and pelvic vasculature, without evidence of aneurysm or dissection. No lymphadenopathy noted in the abdomen or pelvis. Reproductive: Status post hysterectomy.  Ovaries are atrophic. Other: Tiny umbilical hernia containing only omental fat. No significant volume of ascites. No pneumoperitoneum. Musculoskeletal: There are no aggressive appearing lytic or blastic lesions noted in the visualized portions of the skeleton. IMPRESSION: 1. Treated right upper lobe nodule is stable in size. Slight interval enlargement of a 1 cm right hilar lymph node. This is nonspecific, but warrants close attention on followup studies. No other potential signs of metastatic disease are noted in the chest, abdomen or pelvis. 2. Previously noted 5 mm left upper lobe ground-glass attenuation nodule has resolved, indicative of a benign infectious or inflammatory nodule on the prior study. 3. Mild diffuse bronchial  wall thickening with mild centrilobular and mild-to-moderate paraseptal emphysema; imaging findings suggestive of underlying COPD. 4. Hepatic steatosis. 5. Additional incidental findings, as above. Electronically Signed   By: Vinnie Langton M.D.   On: 11/19/2015 10:40   Mr [name] HE Contrast  10/30/2015  CLINICAL DATA:  S RS for right parietal brain metastasis. Being treated with chemotherapy. Follow-up. EXAM: MRI HEAD WITHOUT AND WITH CONTRAST TECHNIQUE: Multiplanar, multiecho pulse sequences of the brain and surrounding structures were  obtained without and with intravenous contrast. CONTRAST:  30m MULTIHANCE GADOBENATE DIMEGLUMINE 529 MG/ML IV SOLN COMPARISON:  07/17/2015.  04/09/2015.  01/09/2015. FINDINGS: No change since the previous study. Peripherally enhancing 10 x 15 mm lesion in the right frontoparietal cortical and subcortical brain is unchanged. Amount of adjacent edema/ gliosis is slightly more prominent. No second enhancing brain lesion is present. Chronic small-vessel ischemic changes affect the pons. No focal cerebellar insult. 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 Punctate focus of hemosiderin deposition in the left anterior thalamus and in the right frontoparietal lesion are unchanged. No large vessel territory insult. No hydrocephalus or extra-axial collection. No calvarial lesion. No significant sinus disease. IMPRESSION: No change in the treated 10 x 15 mm enhancing lesion at the right frontoparietal cortical and subcortical brain. Slightly more adjacent white matter T2 and FLAIR signal consistent with increased surrounding edema. This could be secondary to treatment effect. Electronically Signed   By: MNelson ChimesM.D.   On: 10/30/2015 14:18   Ct Abdomen Pelvis W Contrast  11/19/2015  CLINICAL DATA:  65year old female with history of right upper lobe lung cancer diagnosed in 2014 with metastatic disease to the brain diagnosed in 2015, status post radiation therapy to the brain completed in December 2016. Additional history of radiation therapy to the lung, now complete. Followup study. EXAM: CT CHEST, ABDOMEN, AND PELVIS WITH CONTRAST TECHNIQUE: Multidetector CT imaging of the chest, abdomen and pelvis was performed following the standard protocol during bolus administration of intravenous contrast. CONTRAST:  1066mISOVUE-300 IOPAMIDOL (ISOVUE-300) INJECTION 61% COMPARISON:  Multiple priors, most recently  CT of the chest, abdomen and pelvis 09/18/2015. FINDINGS: CT CHEST FINDINGS Mediastinum/Lymph Nodes: Heart size is normal. There is no significant pericardial fluid, thickening or pericardial calcification. Right hilar lymph node is mildly enlarged measuring 1 cm in short axis (image 26 of series 2), previously 9 mm. No other enlarged mediastinal or left hilar lymph nodes are noted. Esophagus is unremarkable in appearance. No axillary lymphadenopathy. Lungs/Pleura: Previously noted nodule in the apex of the right upper lobe is essentially stable in size, currently measuring 2.5 x 1.5 cm (image 13 of series 5). There continues to be surrounding architectural distortion right upper lobe and superior segment of the right lower lobe, compatible with chronic postradiation changes. Previously noted 5 mm ground-glass attenuation left upper lobe nodule has resolved. No new suspicious appearing pulmonary nodules or masses are otherwise noted. No acute consolidative airspace disease. No pleural effusions. Left apical nodular pleuroparenchymal thickening, most compatible with chronic post infectious or inflammatory scarring is unchanged. Mild diffuse bronchial wall thickening with mild centrilobular and mild-to-moderate paraseptal emphysema, most apparent the lung apices. Musculoskeletal/Soft Tissues: There are no aggressive appearing lytic or blastic lesions noted in the visualized portions of the skeleton. CT ABDOMEN AND PELVIS FINDINGS Hepatobiliary: Mild diffuse decreased attenuation throughout the hepatic parenchyma, compatible hepatic steatosis. No suspicious cystic or solid hepatic lesions.  No intra or extrahepatic biliary ductal dilatation. The appearance of the gallbladder is normal. Pancreas: No pancreatic mass. No pancreatic ductal dilatation. No pancreatic or peripancreatic fluid or inflammatory changes. Spleen: Unremarkable. Adrenals/Urinary Tract: Bilateral kidneys and bilateral adrenal glands are normal in  appearance. No hydroureteronephrosis or perinephric stranding to indicate urinary tract obstruction at this time. Urinary bladder is normal in appearance. Stomach/Bowel: The appearance of the stomach is normal. There is no pathologic dilatation of small bowel or colon. Normal appendix. Vascular/Lymphatic: Atherosclerosis throughout the abdominal and pelvic vasculature, without evidence of aneurysm or dissection. No lymphadenopathy noted in the abdomen or pelvis. Reproductive: Status post hysterectomy.  Ovaries are atrophic. Other: Tiny umbilical hernia containing only omental fat. No significant volume of ascites. No pneumoperitoneum. Musculoskeletal: There are no aggressive appearing lytic or blastic lesions noted in the visualized portions of the skeleton. IMPRESSION: 1. Treated right upper lobe nodule is stable in size. Slight interval enlargement of a 1 cm right hilar lymph node. This is nonspecific, but warrants close attention on followup studies. No other potential signs of metastatic disease are noted in the chest, abdomen or pelvis. 2. Previously noted 5 mm left upper lobe ground-glass attenuation nodule has resolved, indicative of a benign infectious or inflammatory nodule on the prior study. 3. Mild diffuse bronchial wall thickening with mild centrilobular and mild-to-moderate paraseptal emphysema; imaging findings suggestive of underlying COPD. 4. Hepatic steatosis. 5. Additional incidental findings, as above. Electronically Signed   By: Vinnie Langton M.D.   On: 11/19/2015 10:40    ASSESSMENT AND PLAN: This is a very pleasant 65 years old white female recently diagnosed with a stage IV non-small cell lung cancer, adenocarcinoma with negative EGFR mutation and negative ALK gene translocation. The patient is status post stereotactic radiotherapy to a solitary brain metastases in addition to palliative radiotherapy to the right upper lobe lung mass. She status post 6 cycles of systemic chemotherapy  with carboplatin and Alimta with improvement in her disease and she is currently undergoing maintenance chemotherapy with single agent Alimta status post 25 cycles and tolerating her treatment fairly well. The recent CT scan of the chest, abdomen and pelvis showed no evidence for disease progression except for slight increase in right hilar lymph node. I discussed the scan results with the patient and her husband. I recommended for her to continue her current maintenance treatment with single agent Alimta 500 mg/M2 every 3 weeks. She will proceed with cycle #26 today. The patient would come back for follow-up visit in 3 weeks for evaluation before starting cycle #27. For the occasional nausea, I gave the patient refill of Compazine. The patient was advised to call immediately if she has any concerning symptoms in the interval. The patient voices understanding of current disease status and treatment options and is in agreement with the current care plan.  All questions were answered. The patient knows to call the clinic with any problems, questions or concerns. We can certainly see the patient much sooner if necessary.  Disclaimer: This note was dictated with voice recognition software. Similar sounding words can inadvertently be transcribed and may not be corrected upon review.

## 2015-11-23 NOTE — Patient Instructions (Signed)
Pemetrexed injection What is this medicine? PEMETREXED (PEM e TREX ed) is a chemotherapy drug. This medicine affects cells that are rapidly growing, such as cancer cells and cells in your mouth and stomach. It is usually used to treat lung cancers like non-small cell lung cancer and mesothelioma. It may also be used to treat other cancers. This medicine may be used for other purposes; ask your health care provider or pharmacist if you have questions. What should I tell my health care provider before I take this medicine? They need to know if you have any of these conditions: -if you frequently drink alcohol containing beverages -infection (especially a virus infection such as chickenpox, cold sores, or herpes) -kidney disease -liver disease -low blood counts, like low platelets, red bloods, or white blood cells -an unusual or allergic reaction to pemetrexed, mannitol, other medicines, foods, dyes, or preservatives -pregnant or trying to get pregnant -breast-feeding How should I use this medicine? This drug is given as an infusion into a vein. It is administered in a hospital or clinic by a specially trained health care professional. Talk to your pediatrician regarding the use of this medicine in children. Special care may be needed. Overdosage: If you think you have taken too much of this medicine contact a poison control center or emergency room at once. NOTE: This medicine is only for you. Do not share this medicine with others. What if I miss a dose? It is important not to miss your dose. Call your doctor or health care professional if you are unable to keep an appointment. What may interact with this medicine? -aspirin and aspirin-like medicines -medicines to increase blood counts like filgrastim, pegfilgrastim, sargramostim -methotrexate -NSAIDS, medicines for pain and inflammation, like ibuprofen or naproxen -probenecid -pyrimethamine -vaccines Talk to your doctor or health care  professional before taking any of these medicines: -acetaminophen -aspirin -ibuprofen -ketoprofen -naproxen This list may not describe all possible interactions. Give your health care provider a list of all the medicines, herbs, non-prescription drugs, or dietary supplements you use. Also tell them if you smoke, drink alcohol, or use illegal drugs. Some items may interact with your medicine. What should I watch for while using this medicine? Visit your doctor for checks on your progress. This drug may make you feel generally unwell. This is not uncommon, as chemotherapy can affect healthy cells as well as cancer cells. Report any side effects. Continue your course of treatment even though you feel ill unless your doctor tells you to stop. In some cases, you may be given additional medicines to help with side effects. Follow all directions for their use. Call your doctor or health care professional for advice if you get a fever, chills or sore throat, or other symptoms of a cold or flu. Do not treat yourself. This drug decreases your body's ability to fight infections. Try to avoid being around people who are sick. This medicine may increase your risk to bruise or bleed. Call your doctor or health care professional if you notice any unusual bleeding. Be careful brushing and flossing your teeth or using a toothpick because you may get an infection or bleed more easily. If you have any dental work done, tell your dentist you are receiving this medicine. Avoid taking products that contain aspirin, acetaminophen, ibuprofen, naproxen, or ketoprofen unless instructed by your doctor. These medicines may hide a fever. Call your doctor or health care professional if you get diarrhea or mouth sores. Do not treat yourself. To protect your  kidneys, drink water or other fluids as directed while you are taking this medicine. Men and women must use effective birth control while taking this medicine. You may also  need to continue using effective birth control for a time after stopping this medicine. Do not become pregnant while taking this medicine. Tell your doctor right away if you think that you or your partner might be pregnant. There is a potential for serious side effects to an unborn child. Talk to your health care professional or pharmacist for more information. Do not breast-feed an infant while taking this medicine. This medicine may lower sperm counts. What side effects may I notice from receiving this medicine? Side effects that you should report to your doctor or health care professional as soon as possible: -allergic reactions like skin rash, itching or hives, swelling of the face, lips, or tongue -low blood counts - this medicine may decrease the number of white blood cells, red blood cells and platelets. You may be at increased risk for infections and bleeding. -signs of infection - fever or chills, cough, sore throat, pain or difficulty passing urine -signs of decreased platelets or bleeding - bruising, pinpoint red spots on the skin, black, tarry stools, blood in the urine -signs of decreased red blood cells - unusually weak or tired, fainting spells, lightheadedness -breathing problems, like a dry cough -changes in emotions or moods -chest pain -confusion -diarrhea -high blood pressure -mouth or throat sores or ulcers -pain, swelling, warmth in the leg -pain on swallowing -swelling of the ankles, feet, hands -trouble passing urine or change in the amount of urine -vomiting -yellowing of the eyes or skin Side effects that usually do not require medical attention (report to your doctor or health care professional if they continue or are bothersome): -hair loss -loss of appetite -nausea -stomach upset This list may not describe all possible side effects. Call your doctor for medical advice about side effects. You may report side effects to FDA at 1-800-FDA-1088. Where should I keep  my medicine? This drug is given in a hospital or clinic and will not be stored at home. NOTE: This sheet is a summary. It may not cover all possible information. If you have questions about this medicine, talk to your doctor, pharmacist, or health care provider.    2016, [employer]vier/Gold Standard. (2008-03-18 13:24:03)

## 2015-12-14 ENCOUNTER — Encounter: Payer: Self-pay | Admitting: Internal Medicine

## 2015-12-14 ENCOUNTER — Ambulatory Visit (HOSPITAL_BASED_OUTPATIENT_CLINIC_OR_DEPARTMENT_OTHER): Payer: BLUE CROSS/BLUE SHIELD | Admitting: Internal Medicine

## 2015-12-14 ENCOUNTER — Ambulatory Visit (HOSPITAL_BASED_OUTPATIENT_CLINIC_OR_DEPARTMENT_OTHER): Payer: BLUE CROSS/BLUE SHIELD

## 2015-12-14 ENCOUNTER — Other Ambulatory Visit (HOSPITAL_BASED_OUTPATIENT_CLINIC_OR_DEPARTMENT_OTHER): Payer: BLUE CROSS/BLUE SHIELD

## 2015-12-14 VITALS — BP 110/46 | HR 64 | Temp 96.8°F | Resp 17 | Ht 64.0 in | Wt 164.4 lb

## 2015-12-14 DIAGNOSIS — Z5111 Encounter for antineoplastic chemotherapy: Secondary | ICD-10-CM | POA: Diagnosis not present

## 2015-12-14 DIAGNOSIS — C3411 Malignant neoplasm of upper lobe, right bronchus or lung: Secondary | ICD-10-CM

## 2015-12-14 DIAGNOSIS — R110 Nausea: Secondary | ICD-10-CM

## 2015-12-14 DIAGNOSIS — C7931 Secondary malignant neoplasm of brain: Secondary | ICD-10-CM | POA: Diagnosis not present

## 2015-12-14 DIAGNOSIS — C7949 Secondary malignant neoplasm of other parts of nervous system: Secondary | ICD-10-CM

## 2015-12-14 DIAGNOSIS — F411 Generalized anxiety disorder: Secondary | ICD-10-CM

## 2015-12-14 LAB — COMPREHENSIVE METABOLIC PANEL
ALT: <9 U/L (ref 0–55)
AST: 10 U/L (ref 5–34)
Albumin: 3.4 g/dL — ABNORMAL LOW (ref 3.5–5.0)
Alkaline Phosphatase: 80 U/L (ref 40–150)
Anion Gap: 10 mEq/L (ref 3–11)
BUN: 11.4 mg/dL (ref 7.0–26.0)
CO2: 23 mEq/L (ref 22–29)
Calcium: 9.5 mg/dL (ref 8.4–10.4)
Chloride: 108 mEq/L (ref 98–109)
Creatinine: 0.8 mg/dL (ref 0.6–1.1)
EGFR: 79 mL/min/{1.73_m2} — ABNORMAL LOW (ref >90)
Glucose: 137 mg/dl (ref 70–140)
Potassium: 4 mEq/L (ref 3.5–5.1)
Sodium: 141 mEq/L (ref 136–145)
Total Bilirubin: <0.3 mg/dL (ref 0.20–1.20)
Total Protein: 6.8 g/dL (ref 6.4–8.3)

## 2015-12-14 LAB — CBC WITH DIFFERENTIAL/PLATELET
BASO%: 0 % (ref 0.0–2.0)
Basophils Absolute: 0 10*3/uL (ref 0.0–0.1)
EOS%: 0 % (ref 0.0–7.0)
Eosinophils Absolute: 0 10*3/uL (ref 0.0–0.5)
HCT: 35.9 % (ref 34.8–46.6)
HGB: 11.6 g/dL (ref 11.6–15.9)
LYMPH%: 5.7 % — ABNORMAL LOW (ref 14.0–49.7)
MCH: 32.5 pg (ref 25.1–34.0)
MCHC: 32.3 g/dL (ref 31.5–36.0)
MCV: 100.6 fL (ref 79.5–101.0)
MONO#: 0.7 10*3/uL (ref 0.1–0.9)
MONO%: 9 % (ref 0.0–14.0)
NEUT#: 6.4 10*3/uL (ref 1.5–6.5)
NEUT%: 85.3 % — ABNORMAL HIGH (ref 38.4–76.8)
Platelets: 234 10*3/uL (ref 145–400)
RBC: 3.57 10*6/uL — ABNORMAL LOW (ref 3.70–5.45)
RDW: 14.6 % — ABNORMAL HIGH (ref 11.2–14.5)
WBC: 7.5 10*3/uL (ref 3.9–10.3)
lymph#: 0.4 10*3/uL — ABNORMAL LOW (ref 0.9–3.3)

## 2015-12-14 MED ORDER — ALPRAZOLAM 0.25 MG PO TABS: 0.2500 mg | ORAL_TABLET | Freq: Three times a day (TID) | ORAL | Status: DC | PRN

## 2015-12-14 MED ORDER — SODIUM CHLORIDE 0.9 % IV SOLN
Freq: Once | INTRAVENOUS | Status: AC
  Administered 2015-12-14: 11:00:00 via INTRAVENOUS

## 2015-12-14 MED ORDER — SODIUM CHLORIDE 0.9 % IV SOLN
500.0000 mg/m2 | Freq: Once | INTRAVENOUS | Status: AC
  Administered 2015-12-14: 900 mg via INTRAVENOUS
  Filled 2015-12-14: qty 32

## 2015-12-14 MED ORDER — SODIUM CHLORIDE 0.9 % IV SOLN
Freq: Once | INTRAVENOUS | Status: AC
  Administered 2015-12-14: 11:00:00 via INTRAVENOUS
  Filled 2015-12-14: qty 4

## 2015-12-14 MED ORDER — CYANOCOBALAMIN 1000 MCG/ML IJ SOLN
1000.0000 ug | Freq: Once | INTRAMUSCULAR | Status: AC
  Administered 2015-12-14: 1000 ug via INTRAMUSCULAR

## 2015-12-14 MED ORDER — CYANOCOBALAMIN 1000 MCG/ML IJ SOLN
INTRAMUSCULAR | Status: AC
  Filled 2015-12-14: qty 1

## 2015-12-14 NOTE — Patient Instructions (Signed)
[facility] Discharge Instructions for Patients Receiving Chemotherapy  Today you received the following chemotherapy agents Alimta.  To help prevent nausea and vomiting after your treatment, we encourage you to take your nausea medication as prescribed.   If you develop nausea and vomiting that is not controlled by your nausea medication, call the clinic.   BELOW ARE SYMPTOMS THAT SHOULD BE REPORTED IMMEDIATELY:  *FEVER GREATER THAN 100.5 F  *CHILLS WITH OR WITHOUT FEVER  NAUSEA AND VOMITING THAT IS NOT CONTROLLED WITH YOUR NAUSEA MEDICATION  *UNUSUAL SHORTNESS OF BREATH  *UNUSUAL BRUISING OR BLEEDING  TENDERNESS IN MOUTH AND THROAT WITH OR WITHOUT PRESENCE OF ULCERS  *URINARY PROBLEMS  *BOWEL PROBLEMS  UNUSUAL RASH Items with * indicate a potential emergency and should be followed up as soon as possible.  Feel free to call the clinic you have any questions or concerns. The clinic phone number is [phone].  Please show the CHEMO ALERT CARD at check-in to the Emergency Department and triage nurse.   

## 2015-12-14 NOTE — Progress Notes (Signed)
[place] Telephone:([phone]) [phone]   Fax:([phone]) [place], MD [address] [place]  [phone]  DIAGNOSIS: Stage IV (T2a, N0, M1b) non-small cell lung cancer consistent with adenocarcinoma with negative EGFR mutation and negative ALK gene translocation diagnosed in January of 2015 presented with right upper lobe lung mass in addition to a solitary brain metastasis status post stereotactic to a solitary brain metastasis as well as radiotherapy to the right upper lobe lung mass.  PRIOR THERAPY:  1) Status post stereotactic radiotherapy to a solitary right parietal brain lesion under the care of Dr. [name] on 10/16/2013. 2) Status post palliative radiotherapy to the right lung tumor under the care of Dr. [name] completed on 12/05/2013. 3) Systemic chemotherapy with carboplatin for AUC of 5 and Alimta 500 mg/M2 every 3 weeks. First dose Jan 06 2014. Status post 6 cycles.  CURRENT THERAPY: Systemic chemotherapy with maintenance Alimta 500 mg/M2 every 3 weeks. Status post 26 cycles.  INTERVAL HISTORY: Alexis [name] 65 y.o. female returns to the clinic today for 3 weeks followup visit accompanied by her husband. The patient is feeling fine today and denied having any specific complaints. She status post 26 cycles of maintenance chemotherapy. She is tolerating her maintenance chemotherapy with Alimta fairly well. She denied having any significant chest pain, shortness of breath, cough or hemoptysis. She has no fever or chills, no nausea or vomiting. She denied having any significant weight loss or night sweats.   MEDICAL HISTORY: Past Medical History  Diagnosis Date  . Ileus, postoperative 11/18/2013  . Physical deconditioning 11/18/2013  . Severe protein-calorie malnutrition ([facility]) 11/18/2013  . Anemia 11/18/2013  . History of radiation therapy 10-28-13- 12-05-13    lung ca 50 Gy/57f  . Anxiety   . Hx of radiation therapy 10/16/13    rt parietal  brain/20Gy  . Encounter for antineoplastic chemotherapy 07/20/2015  . 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  MEDICATIONS:  Current Outpatient Prescriptions  Medication Sig Dispense Refill  . acetaminophen (TYLENOL) 500 MG tablet Take 500 mg by mouth every 6 (six) hours as needed for mild pain or headache. Reported on 11/02/2015    . ALPRAZolam (XANAX) 0.25 MG tablet Take 1 tablet (0.25 mg total) by mouth 3 (three) times daily as needed. for anxiety 45 tablet 0  . Ascorbic Acid (VITAMIN C GUMMIE PO) Take 1 each by mouth every morning.    .Marland Kitchendexamethasone (DECADRON) 4 MG tablet 4 mg by mouth twice a day the day before, day of and day after the chemotherapy every 3 weeks 40 tablet 1  . folic acid (FOLVITE) 1 MG tablet Take 1 tablet (1 mg total) by mouth daily. 30 tablet 2  . folic acid (FOLVITE) 1 MG tablet TAKE 1 TABLET (1 MG TOTAL) BY MOUTH DAILY. 30 tablet 0  . Multiple Vitamin (MULTIVITAMIN WITH MINERALS) TABS tablet Take 1 tablet by mouth every morning.    .Marland Kitchenomeprazole (PRILOSEC) 20 MG capsule Take 1 capsule (20 mg total) by mouth daily. As needed for reflux or indigestion. 42 capsule PRN  . ondansetron (ZOFRAN) 8 MG tablet Take 1 tablet (8 mg total) by mouth every 8 (eight) hours as needed for nausea or vomiting. 30 tablet 0  . OVER THE COUNTER MEDICATION Take 1 tablet by mouth every morning. (Vitamin A)    .  pentoxifylline (TRENTAL) 400 MG CR tablet TAKE ONE TABLET BY MOUTH TWICE DAILY, ALSO  TAKE  VITAMIN  E  400  UNITS  TWICE  DAILY  WITH  PENTOXIFYLLINE 60 tablet 0  . pentoxifylline (TRENTAL) 400 MG CR tablet Take 1 tablet (400 mg total) by mouth 2 (two) times daily. Patient is also to take vitamin E 400 mg BID with trental. 60 tablet 3  . prochlorperazine (COMPAZINE) 10 MG tablet Take 1 tablet (10 mg total) by mouth every 6 (six) hours as needed for nausea or vomiting. 60 tablet 0  . senna-docusate  (SENOKOT-S) 8.6-50 MG tablet Take 1 tablet by mouth daily. 30 tablet prn  . Zinc 50 MG TABS Take 1 tablet by mouth every morning.     No current facility-administered medications for this visit.    SURGICAL HISTORY:  Past Surgical History  Procedure Laterality Date  . Video bronchoscopy Bilateral 08/30/2013    Procedure: VIDEO BRONCHOSCOPY WITH FLUORO;  Surgeon: Tanda Rockers, MD;  Location: [facility];  Service: Cardiopulmonary;  Laterality: Bilateral;  . Abdominal hysterectomy    . Laparotomy N/A 11/03/2013    Procedure: EXPLORATORY LAPAROTOMY, DRAINAGE OF INTRA  ABDOMINAL ABSCESSES, MOBILIZATION OF SPLENIC FLEXURE, SIGMOID COLECTOMY WITH COLOSTOMY;  Surgeon: Odis Hollingshead, MD;  Location: [facility];  Service: General;  Laterality: N/A;  . Colostomy takedown N/A 07/10/2014    Procedure: LAPAROSCOPIC LYSIS OF ADHESIONS (90 MIN) LAPAROSCOPIC ASSISTED COLOSTOMY CLOSURE, RIGID PROCTOSIGMOIDOSCOPY;  Surgeon: Jackolyn Confer, MD;  Location: [facility];  Service: General;  Laterality: N/A;    REVIEW OF SYSTEMS:  A comprehensive review of systems was negative.   PHYSICAL EXAMINATION: General appearance: alert, cooperative, fatigued and no distress Head: Normocephalic, without obvious abnormality, atraumatic Neck: no adenopathy, no JVD, supple, symmetrical, trachea midline and thyroid not enlarged, symmetric, no tenderness/mass/nodules Lymph nodes: Cervical, supraclavicular, and axillary nodes normal. Resp: clear to auscultation bilaterally Back: symmetric, no curvature. ROM normal. No CVA tenderness. Cardio: regular rate and rhythm, S1, S2 normal, no murmur, click, rub or gallop GI: soft, non-tender; bowel sounds normal; no masses,  no organomegaly Extremities: extremities normal, atraumatic, no cyanosis or edema Neurologic: Alert and oriented X 3, normal strength and tone. Normal symmetric reflexes. Normal coordination and gait  ECOG PERFORMANCE STATUS: 0 - Asymptomatic  Blood pressure 110/46,  pulse 64, temperature 96.8 F (36 C), resp. rate 17, height _0  (1.626 m), weight 164 lb 6.4 oz (74.571 kg), SpO2 100 %.  LABORATORY DATA: Lab Results  Component Value Date   WBC 7.5 12/14/2015   HGB 11.6 12/14/2015   HCT 35.9 12/14/2015   MCV 100.6 12/14/2015   PLT 234 12/14/2015      Chemistry      Component Value Date/Time   NA 141 11/23/2015 0812   NA 139 07/12/2014 0512   K 4.1 11/23/2015 0812   K 3.9 07/12/2014 0512   CL 100 07/12/2014 0512   CO2 25 11/23/2015 0812   CO2 32 07/12/2014 0512   BUN 15.2 11/23/2015 0812   BUN 5* 07/12/2014 0512   CREATININE 0.8 11/23/2015 0812   CREATININE 0.55 07/12/2014 0512      Component Value Date/Time   CALCIUM 9.4 11/23/2015 0812   CALCIUM 9.0 07/12/2014 0512   ALKPHOS 87 11/23/2015 0812   ALKPHOS 91 07/10/2014 0955   AST 11 11/23/2015 0812   AST 15 07/10/2014 0955   ALT 17 11/23/2015 0812   ALT 8 07/10/2014 0955   BILITOT 0.33 11/23/2015 1779  BILITOT 0.4 07/10/2014 0955       RADIOGRAPHIC STUDIES: Ct Chest W Contrast  11/19/2015  CLINICAL DATA:  65 year old female with history of right upper lobe lung cancer diagnosed in 2014 with metastatic disease to the brain diagnosed in 2015, status post radiation therapy to the brain completed in December 2016. Additional history of radiation therapy to the lung, now complete. Followup study. EXAM: CT CHEST, ABDOMEN, AND PELVIS WITH CONTRAST TECHNIQUE: Multidetector CT imaging of the chest, abdomen and pelvis was performed following the standard protocol during bolus administration of intravenous contrast. CONTRAST:  161m ISOVUE-300 IOPAMIDOL (ISOVUE-300) INJECTION 61% COMPARISON:  Multiple priors, most recently CT of the chest, abdomen and pelvis 09/18/2015. FINDINGS: CT CHEST FINDINGS Mediastinum/Lymph Nodes: Heart size is normal. There is no significant pericardial fluid, thickening or pericardial calcification. Right hilar lymph node is mildly enlarged measuring 1 cm in short axis  (image 26 of series 2), previously 9 mm. No other enlarged mediastinal or left hilar lymph nodes are noted. Esophagus is unremarkable in appearance. No axillary lymphadenopathy. Lungs/Pleura: Previously noted nodule in the apex of the right upper lobe is essentially stable in size, currently measuring 2.5 x 1.5 cm (image 13 of series 5). There continues to be surrounding architectural distortion right upper lobe and superior segment of the right lower lobe, compatible with chronic postradiation changes. Previously noted 5 mm ground-glass attenuation left upper lobe nodule has resolved. No new suspicious appearing pulmonary nodules or masses are otherwise noted. No acute consolidative airspace disease. No pleural effusions. Left apical nodular pleuroparenchymal thickening, most compatible with chronic post infectious or inflammatory scarring is unchanged. Mild diffuse bronchial wall thickening with mild centrilobular and mild-to-moderate paraseptal emphysema, most apparent the lung apices. Musculoskeletal/Soft Tissues: There are no aggressive appearing lytic or blastic lesions noted in the visualized portions of the skeleton. CT ABDOMEN AND PELVIS FINDINGS Hepatobiliary: Mild diffuse decreased attenuation throughout the hepatic parenchyma, compatible hepatic steatosis. No suspicious cystic or solid hepatic lesions. No intra or extrahepatic biliary ductal dilatation. The appearance of the gallbladder is normal. Pancreas: No pancreatic mass. No pancreatic ductal dilatation. No pancreatic or peripancreatic fluid or inflammatory changes. Spleen: Unremarkable. Adrenals/Urinary Tract: Bilateral kidneys and bilateral adrenal glands are normal in appearance. No hydroureteronephrosis or perinephric stranding to indicate urinary tract obstruction at this time. Urinary bladder is normal in appearance. Stomach/Bowel: The appearance of the stomach is normal. There is no pathologic dilatation of small bowel or colon. Normal  appendix. Vascular/Lymphatic: Atherosclerosis throughout the abdominal and pelvic vasculature, without evidence of aneurysm or dissection. No lymphadenopathy noted in the abdomen or pelvis. Reproductive: Status post hysterectomy.  Ovaries are atrophic. Other: Tiny umbilical hernia containing only omental fat. No significant volume of ascites. No pneumoperitoneum. Musculoskeletal: There are no aggressive appearing lytic or blastic lesions noted in the visualized portions of the skeleton. IMPRESSION: 1. Treated right upper lobe nodule is stable in size. Slight interval enlargement of a 1 cm right hilar lymph node. This is nonspecific, but warrants close attention on followup studies. No other potential signs of metastatic disease are noted in the chest, abdomen or pelvis. 2. Previously noted 5 mm left upper lobe ground-glass attenuation nodule has resolved, indicative of a benign infectious or inflammatory nodule on the prior study. 3. Mild diffuse bronchial wall thickening with mild centrilobular and mild-to-moderate paraseptal emphysema; imaging findings suggestive of underlying COPD. 4. Hepatic steatosis. 5. Additional incidental findings, as above. Electronically Signed   By: DVinnie LangtonM.D.   On: 11/19/2015 10:40  Ct Abdomen Pelvis W Contrast  11/19/2015  CLINICAL DATA:  65 year old female with history of right upper lobe lung cancer diagnosed in 2014 with metastatic disease to the brain diagnosed in 2015, status post radiation therapy to the brain completed in December 2016. Additional history of radiation therapy to the lung, now complete. Followup study. EXAM: CT CHEST, ABDOMEN, AND PELVIS WITH CONTRAST TECHNIQUE: Multidetector CT imaging of the chest, abdomen and pelvis was performed following the standard protocol during bolus administration of intravenous contrast. CONTRAST:  169m ISOVUE-300 IOPAMIDOL (ISOVUE-300) INJECTION 61% COMPARISON:  Multiple priors, most recently CT of the chest, abdomen  and pelvis 09/18/2015. FINDINGS: CT CHEST FINDINGS Mediastinum/Lymph Nodes: Heart size is normal. There is no significant pericardial fluid, thickening or pericardial calcification. Right hilar lymph node is mildly enlarged measuring 1 cm in short axis (image 26 of series 2), previously 9 mm. No other enlarged mediastinal or left hilar lymph nodes are noted. Esophagus is unremarkable in appearance. No axillary lymphadenopathy. Lungs/Pleura: Previously noted nodule in the apex of the right upper lobe is essentially stable in size, currently measuring 2.5 x 1.5 cm (image 13 of series 5). There continues to be surrounding architectural distortion right upper lobe and superior segment of the right lower lobe, compatible with chronic postradiation changes. Previously noted 5 mm ground-glass attenuation left upper lobe nodule has resolved. No new suspicious appearing pulmonary nodules or masses are otherwise noted. No acute consolidative airspace disease. No pleural effusions. Left apical nodular pleuroparenchymal thickening, most compatible with chronic post infectious or inflammatory scarring is unchanged. Mild diffuse bronchial wall thickening with mild centrilobular and mild-to-moderate paraseptal emphysema, most apparent the lung apices. Musculoskeletal/Soft Tissues: There are no aggressive appearing lytic or blastic lesions noted in the visualized portions of the skeleton. CT ABDOMEN AND PELVIS FINDINGS Hepatobiliary: Mild diffuse decreased attenuation throughout the hepatic parenchyma, compatible hepatic steatosis. No suspicious cystic or solid hepatic lesions. No intra or extrahepatic biliary ductal dilatation. The appearance of the gallbladder is normal. Pancreas: No pancreatic mass. No pancreatic ductal dilatation. No pancreatic or peripancreatic fluid or inflammatory changes. Spleen: Unremarkable. Adrenals/Urinary Tract: Bilateral kidneys and bilateral adrenal glands are normal in appearance. No  hydroureteronephrosis or perinephric stranding to indicate urinary tract obstruction at this time. Urinary bladder is normal in appearance. Stomach/Bowel: The appearance of the stomach is normal. There is no pathologic dilatation of small bowel or colon. Normal appendix. Vascular/Lymphatic: Atherosclerosis throughout the abdominal and pelvic vasculature, without evidence of aneurysm or dissection. No lymphadenopathy noted in the abdomen or pelvis. Reproductive: Status post hysterectomy.  Ovaries are atrophic. Other: Tiny umbilical hernia containing only omental fat. No significant volume of ascites. No pneumoperitoneum. Musculoskeletal: There are no aggressive appearing lytic or blastic lesions noted in the visualized portions of the skeleton. IMPRESSION: 1. Treated right upper lobe nodule is stable in size. Slight interval enlargement of a 1 cm right hilar lymph node. This is nonspecific, but warrants close attention on followup studies. No other potential signs of metastatic disease are noted in the chest, abdomen or pelvis. 2. Previously noted 5 mm left upper lobe ground-glass attenuation nodule has resolved, indicative of a benign infectious or inflammatory nodule on the prior study. 3. Mild diffuse bronchial wall thickening with mild centrilobular and mild-to-moderate paraseptal emphysema; imaging findings suggestive of underlying COPD. 4. Hepatic steatosis. 5. Additional incidental findings, as above. Electronically Signed   By: DVinnie LangtonM.D.   On: 11/19/2015 10:40    ASSESSMENT AND PLAN: This is a very pleasant 65  years old white female recently diagnosed with a stage IV non-small cell lung cancer, adenocarcinoma with negative EGFR mutation and negative ALK gene translocation. The patient is status post stereotactic radiotherapy to a solitary brain metastases in addition to palliative radiotherapy to the right upper lobe lung mass. She status post 6 cycles of systemic chemotherapy with carboplatin  and Alimta with improvement in her disease and she is currently undergoing maintenance chemotherapy with single agent Alimta status post 26 cycles and tolerating her treatment fairly well. I recommended for her to continue her current maintenance treatment with single agent Alimta 500 mg/M2 every 3 weeks. She will proceed with cycle #27 today. The patient would come back for follow-up visit in 3 weeks for evaluation before starting cycle #28. For the occasional nausea, I gave the patient refill of Compazine. The patient was advised to call immediately if she has any concerning symptoms in the interval. The patient voices understanding of current disease status and treatment options and is in agreement with the current care plan.  All questions were answered. The patient knows to call the clinic with any problems, questions or concerns. We can certainly see the patient much sooner if necessary.  Disclaimer: This note was dictated with voice recognition software. Similar sounding words can inadvertently be transcribed and may not be corrected upon review.

## 2015-12-16 ENCOUNTER — Other Ambulatory Visit: Payer: Self-pay | Admitting: *Deleted

## 2015-12-16 DIAGNOSIS — C7931 Secondary malignant neoplasm of brain: Secondary | ICD-10-CM

## 2015-12-16 DIAGNOSIS — C7949 Secondary malignant neoplasm of other parts of nervous system: Secondary | ICD-10-CM

## 2015-12-16 DIAGNOSIS — C3411 Malignant neoplasm of upper lobe, right bronchus or lung: Secondary | ICD-10-CM

## 2015-12-16 MED ORDER — DEXAMETHASONE 4 MG PO TABS: ORAL_TABLET | ORAL | Status: DC

## 2015-12-25 ENCOUNTER — Other Ambulatory Visit: Payer: Self-pay | Admitting: Internal Medicine

## 2015-12-30 ENCOUNTER — Other Ambulatory Visit: Payer: Self-pay | Admitting: Medical Oncology

## 2015-12-30 DIAGNOSIS — C3411 Malignant neoplasm of upper lobe, right bronchus or lung: Secondary | ICD-10-CM

## 2015-12-30 MED ORDER — FOLIC ACID 1 MG PO TABS: ORAL_TABLET | ORAL | Status: DC

## 2016-01-04 ENCOUNTER — Encounter: Payer: Self-pay | Admitting: *Deleted

## 2016-01-04 ENCOUNTER — Encounter: Payer: Self-pay | Admitting: Internal Medicine

## 2016-01-04 ENCOUNTER — Telehealth: Payer: Self-pay | Admitting: Internal Medicine

## 2016-01-04 ENCOUNTER — Other Ambulatory Visit (HOSPITAL_BASED_OUTPATIENT_CLINIC_OR_DEPARTMENT_OTHER): Payer: BLUE CROSS/BLUE SHIELD

## 2016-01-04 ENCOUNTER — Ambulatory Visit (HOSPITAL_BASED_OUTPATIENT_CLINIC_OR_DEPARTMENT_OTHER): Payer: BLUE CROSS/BLUE SHIELD

## 2016-01-04 ENCOUNTER — Ambulatory Visit (HOSPITAL_BASED_OUTPATIENT_CLINIC_OR_DEPARTMENT_OTHER): Payer: BLUE CROSS/BLUE SHIELD | Admitting: Internal Medicine

## 2016-01-04 VITALS — BP 114/88 | HR 96 | Temp 98.2°F | Resp 18 | Ht 64.0 in | Wt 164.8 lb

## 2016-01-04 DIAGNOSIS — C3411 Malignant neoplasm of upper lobe, right bronchus or lung: Secondary | ICD-10-CM | POA: Diagnosis not present

## 2016-01-04 DIAGNOSIS — C7931 Secondary malignant neoplasm of brain: Secondary | ICD-10-CM

## 2016-01-04 DIAGNOSIS — Z5111 Encounter for antineoplastic chemotherapy: Secondary | ICD-10-CM

## 2016-01-04 LAB — COMPREHENSIVE METABOLIC PANEL
ALT: 24 U/L (ref 0–55)
AST: 11 U/L (ref 5–34)
Albumin: 3.5 g/dL (ref 3.5–5.0)
Alkaline Phosphatase: 93 U/L (ref 40–150)
Anion Gap: 9 mEq/L (ref 3–11)
BUN: 12.8 mg/dL (ref 7.0–26.0)
CO2: 25 mEq/L (ref 22–29)
Calcium: 9.6 mg/dL (ref 8.4–10.4)
Chloride: 108 mEq/L (ref 98–109)
Creatinine: 0.8 mg/dL (ref 0.6–1.1)
EGFR: 80 mL/min/{1.73_m2} — ABNORMAL LOW (ref >90)
Glucose: 125 mg/dl (ref 70–140)
Potassium: 3.6 mEq/L (ref 3.5–5.1)
Sodium: 142 mEq/L (ref 136–145)
Total Bilirubin: 0.34 mg/dL (ref 0.20–1.20)
Total Protein: 7 g/dL (ref 6.4–8.3)

## 2016-01-04 LAB — CBC WITH DIFFERENTIAL/PLATELET
BASO%: 0.2 % (ref 0.0–2.0)
Basophils Absolute: 0 10*3/uL (ref 0.0–0.1)
EOS%: 0 % (ref 0.0–7.0)
Eosinophils Absolute: 0 10*3/uL (ref 0.0–0.5)
HCT: 37.6 % (ref 34.8–46.6)
HGB: 12.2 g/dL (ref 11.6–15.9)
LYMPH%: 4.4 % — ABNORMAL LOW (ref 14.0–49.7)
MCH: 32 pg (ref 25.1–34.0)
MCHC: 32.4 g/dL (ref 31.5–36.0)
MCV: 98.9 fL (ref 79.5–101.0)
MONO#: 0.4 10*3/uL (ref 0.1–0.9)
MONO%: 5 % (ref 0.0–14.0)
NEUT#: 6.6 10*3/uL — ABNORMAL HIGH (ref 1.5–6.5)
NEUT%: 90.4 % — ABNORMAL HIGH (ref 38.4–76.8)
Platelets: 328 10*3/uL (ref 145–400)
RBC: 3.81 10*6/uL (ref 3.70–5.45)
RDW: 15 % — ABNORMAL HIGH (ref 11.2–14.5)
WBC: 7.3 10*3/uL (ref 3.9–10.3)
lymph#: 0.3 10*3/uL — ABNORMAL LOW (ref 0.9–3.3)

## 2016-01-04 MED ORDER — SODIUM CHLORIDE 0.9 % IV SOLN
Freq: Once | INTRAVENOUS | Status: AC
  Administered 2016-01-04: 10:00:00 via INTRAVENOUS

## 2016-01-04 MED ORDER — SODIUM CHLORIDE 0.9 % IV SOLN
Freq: Once | INTRAVENOUS | Status: AC
  Administered 2016-01-04: 10:00:00 via INTRAVENOUS
  Filled 2016-01-04: qty 4

## 2016-01-04 MED ORDER — SODIUM CHLORIDE 0.9 % IV SOLN
500.0000 mg/m2 | Freq: Once | INTRAVENOUS | Status: AC
  Administered 2016-01-04: 900 mg via INTRAVENOUS
  Filled 2016-01-04: qty 32

## 2016-01-04 NOTE — Progress Notes (Signed)
Oncology Nurse Navigator Documentation  Oncology Nurse Navigator Flowsheets 01/04/2016  Navigator Location [facility]  Patient Visit Type MedOnc  Treatment Phase Treatment  Barriers/Navigation Needs Coordination of Care  Interventions Coordination of Care  Acuity Level 1  Time Spent with Patient 15   Patient is doing well today.  He was late today and treatment was changed until tomorrow.  Patient aware.

## 2016-01-04 NOTE — Telephone Encounter (Signed)
Gave and printed appt sched and avs for pt for may thru July

## 2016-01-04 NOTE — Telephone Encounter (Signed)
Gave adn printed appt sched and avs for pt for May thru July

## 2016-01-04 NOTE — Progress Notes (Signed)
Wrong patient  Patient is doing well without complaints.  She received tx today.

## 2016-01-04 NOTE — Patient Instructions (Signed)
[facility] Discharge Instructions for Patients Receiving Chemotherapy  Today you received the following chemotherapy agents Alimta.  To help prevent nausea and vomiting after your treatment, we encourage you to take your nausea medication as prescribed.   If you develop nausea and vomiting that is not controlled by your nausea medication, call the clinic.   BELOW ARE SYMPTOMS THAT SHOULD BE REPORTED IMMEDIATELY:  *FEVER GREATER THAN 100.5 F  *CHILLS WITH OR WITHOUT FEVER  NAUSEA AND VOMITING THAT IS NOT CONTROLLED WITH YOUR NAUSEA MEDICATION  *UNUSUAL SHORTNESS OF BREATH  *UNUSUAL BRUISING OR BLEEDING  TENDERNESS IN MOUTH AND THROAT WITH OR WITHOUT PRESENCE OF ULCERS  *URINARY PROBLEMS  *BOWEL PROBLEMS  UNUSUAL RASH Items with * indicate a potential emergency and should be followed up as soon as possible.  Feel free to call the clinic you have any questions or concerns. The clinic phone number is [phone].  Please show the CHEMO ALERT CARD at check-in to the Emergency Department and triage nurse.   

## 2016-01-04 NOTE — Progress Notes (Signed)
[facility] Telephone:([phone]   Fax:([phone]) [place], MD [address] [place] [address]  DIAGNOSIS: Stage IV (T2a, N0, M1b) non-small cell lung cancer consistent with adenocarcinoma with negative EGFR mutation and negative ALK gene translocation diagnosed in January of 2015 presented with right upper lobe lung mass in addition to a solitary brain metastasis status post stereotactic to a solitary brain metastasis as well as radiotherapy to the right upper lobe lung mass.  PRIOR THERAPY:  1) Status post stereotactic radiotherapy to a solitary right parietal brain lesion under the care of Dr. [name] on 10/16/2013. 2) Status post palliative radiotherapy to the right lung tumor under the care of Dr. [name] completed on 12/05/2013. 3) Systemic chemotherapy with carboplatin for AUC of 5 and Alimta 500 mg/M2 every 3 weeks. First dose Jan 06 2014. Status post 6 cycles.  CURRENT THERAPY: Systemic chemotherapy with maintenance Alimta 500 mg/M2 every 3 weeks. Status post 27 cycles.  INTERVAL HISTORY: Alexis [name] 65 y.o. female returns to the clinic today for 3 weeks followup visit accompanied by her husband. The patient is feeling fine today and denied having any specific complaints. She status post 27 cycles of maintenance chemotherapy. She is tolerating her maintenance chemotherapy with Alimta fairly well. She will be 66 in February and she is worried about insurance change. She denied having any significant chest pain, shortness of breath, cough or hemoptysis. She has no fever or chills, no nausea or vomiting. She denied having any significant weight loss or night sweats.   MEDICAL HISTORY: Past Medical History  Diagnosis Date  . Ileus, postoperative 11/18/2013  . Physical deconditioning 11/18/2013  . Severe protein-calorie malnutrition ([place]) 11/18/2013  . Anemia 11/18/2013  . History of radiation therapy 10-28-13- 12-05-13    lung ca 50  Gy/27f  . Anxiety   . Hx of radiation therapy 10/16/13    rt parietal brain/20Gy  . Encounter for antineoplastic chemotherapy 07/20/2015  . 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  MEDICATIONS:  Current Outpatient Prescriptions  Medication Sig Dispense Refill  . acetaminophen (TYLENOL) 500 MG tablet Take 500 mg by mouth every 6 (six) hours as needed for mild pain or headache. Reported on 11/02/2015    . ALPRAZolam (XANAX) 0.25 MG tablet Take 1 tablet (0.25 mg total) by mouth 3 (three) times daily as needed. for anxiety 45 tablet 0  . Ascorbic Acid (VITAMIN C GUMMIE PO) Take 1 each by mouth every morning.    .Marland Kitchendexamethasone (DECADRON) 4 MG tablet 4 mg by mouth twice a day the day before, day of and day after the chemotherapy every 3 weeks 40 tablet 1  . folic acid (FOLVITE) 1 MG tablet TAKE 1 TABLET (1 MG TOTAL) BY MOUTH DAILY. 30 tablet 0  . Multiple Vitamin (MULTIVITAMIN WITH MINERALS) TABS tablet Take 1 tablet by mouth every morning.    .Marland Kitchenomeprazole (PRILOSEC) 20 MG capsule Take 1 capsule (20 mg total) by mouth daily. As needed for reflux or indigestion. 42 capsule PRN  . ondansetron (ZOFRAN) 8 MG tablet Take 1 tablet (8 mg total) by mouth every 8 (eight) hours as needed for nausea or vomiting. 30 tablet 0  . OVER THE COUNTER MEDICATION Take 1 tablet by mouth every morning. (Vitamin A)    . pentoxifylline (TRENTAL) 400 MG  CR tablet TAKE ONE TABLET BY MOUTH TWICE DAILY, ALSO  TAKE  VITAMIN  E  400  UNITS  TWICE  DAILY  WITH  PENTOXIFYLLINE 60 tablet 0  . pentoxifylline (TRENTAL) 400 MG CR tablet Take 1 tablet (400 mg total) by mouth 2 (two) times daily. Patient is also to take vitamin E 400 mg BID with trental. 60 tablet 3  . prochlorperazine (COMPAZINE) 10 MG tablet Take 1 tablet (10 mg total) by mouth every 6 (six) hours as needed for nausea or vomiting. 60 tablet 0  . senna-docusate (SENOKOT-S) 8.6-50  MG tablet Take 1 tablet by mouth daily. 30 tablet prn  . Zinc 50 MG TABS Take 1 tablet by mouth every morning.     No current facility-administered medications for this visit.    SURGICAL HISTORY:  Past Surgical History  Procedure Laterality Date  . Video bronchoscopy Bilateral 08/30/2013    Procedure: VIDEO BRONCHOSCOPY WITH FLUORO;  Surgeon: Tanda Rockers, MD;  Location: [facility];  Service: Cardiopulmonary;  Laterality: Bilateral;  . Abdominal hysterectomy    . Laparotomy N/A 11/03/2013    Procedure: EXPLORATORY LAPAROTOMY, DRAINAGE OF INTRA  ABDOMINAL ABSCESSES, MOBILIZATION OF SPLENIC FLEXURE, SIGMOID COLECTOMY WITH COLOSTOMY;  Surgeon: Odis Hollingshead, MD;  Location: [facility];  Service: General;  Laterality: N/A;  . Colostomy takedown N/A 07/10/2014    Procedure: LAPAROSCOPIC LYSIS OF ADHESIONS (90 MIN) LAPAROSCOPIC ASSISTED COLOSTOMY CLOSURE, RIGID PROCTOSIGMOIDOSCOPY;  Surgeon: Jackolyn Confer, MD;  Location: [facility];  Service: General;  Laterality: N/A;    REVIEW OF SYSTEMS:  A comprehensive review of systems was negative.   PHYSICAL EXAMINATION: General appearance: alert, cooperative, fatigued and no distress Head: Normocephalic, without obvious abnormality, atraumatic Neck: no adenopathy, no JVD, supple, symmetrical, trachea midline and thyroid not enlarged, symmetric, no tenderness/mass/nodules Lymph nodes: Cervical, supraclavicular, and axillary nodes normal. Resp: clear to auscultation bilaterally Back: symmetric, no curvature. ROM normal. No CVA tenderness. Cardio: regular rate and rhythm, S1, S2 normal, no murmur, click, rub or gallop GI: soft, non-tender; bowel sounds normal; no masses,  no organomegaly Extremities: extremities normal, atraumatic, no cyanosis or edema Neurologic: Alert and oriented X 3, normal strength and tone. Normal symmetric reflexes. Normal coordination and gait  ECOG PERFORMANCE STATUS: 0 - Asymptomatic  Blood pressure 114/88, pulse 96,  temperature 98.2 F (36.8 C), temperature source Oral, resp. rate 18, height 5' 4"  (1.626 m), weight 164 lb 12.8 oz (74.753 kg), SpO2 100 %.  LABORATORY DATA: Lab Results  Component Value Date   WBC 7.3 01/04/2016   HGB 12.2 01/04/2016   HCT 37.6 01/04/2016   MCV 98.9 01/04/2016   PLT 328 01/04/2016      Chemistry      Component Value Date/Time   NA 141 12/14/2015 0825   NA 139 07/12/2014 0512   K 4.0 12/14/2015 0825   K 3.9 07/12/2014 0512   CL 100 07/12/2014 0512   CO2 23 12/14/2015 0825   CO2 32 07/12/2014 0512   BUN 11.4 12/14/2015 0825   BUN 5* 07/12/2014 0512   CREATININE 0.8 12/14/2015 0825   CREATININE 0.55 07/12/2014 0512      Component Value Date/Time   CALCIUM 9.5 12/14/2015 0825   CALCIUM 9.0 07/12/2014 0512   ALKPHOS 80 12/14/2015 0825   ALKPHOS 91 07/10/2014 0955   AST 10 12/14/2015 0825   AST 15 07/10/2014 0955   ALT <9 12/14/2015 0825   ALT 8 07/10/2014 0955   BILITOT <0.30 12/14/2015 0825  BILITOT 0.4 07/10/2014 0955       RADIOGRAPHIC STUDIES: No results found.  ASSESSMENT AND PLAN: This is a very pleasant 65 years old white female recently diagnosed with a stage IV non-small cell lung cancer, adenocarcinoma with negative EGFR mutation and negative ALK gene translocation. The patient is status post stereotactic radiotherapy to a solitary brain metastases in addition to palliative radiotherapy to the right upper lobe lung mass. She status post 6 cycles of systemic chemotherapy with carboplatin and Alimta with improvement in her disease and she is currently undergoing maintenance chemotherapy with single agent Alimta status post 27 cycles and tolerating her treatment fairly well. I recommended for her to continue her current maintenance treatment with single agent Alimta 500 mg/M2 every 3 weeks. She will proceed with cycle #28 today. The patient would come back for follow-up visit in 3 weeks for evaluation before starting cycle #28 after repeating CT  scan of the chest, abdomen and pelvis for restaging of her disease. The patient was advised to call immediately if she has any concerning symptoms in the interval. The patient voices understanding of current disease status and treatment options and is in agreement with the current care plan.  All questions were answered. The patient knows to call the clinic with any problems, questions or concerns. We can certainly see the patient much sooner if necessary.  Disclaimer: This note was dictated with voice recognition software. Similar sounding words can inadvertently be transcribed and may not be corrected upon review.

## 2016-01-22 ENCOUNTER — Ambulatory Visit (HOSPITAL_COMMUNITY)
Admission: RE | Admit: 2016-01-22 | Discharge: 2016-01-22 | Disposition: A | Discharge status: Home / Self Care | Payer: BLUE CROSS/BLUE SHIELD | Source: Ambulatory Visit | Attending: Internal Medicine | Admitting: Internal Medicine

## 2016-01-22 ENCOUNTER — Other Ambulatory Visit: Payer: Self-pay | Admitting: Internal Medicine

## 2016-01-22 DIAGNOSIS — R911 Solitary pulmonary nodule: Secondary | ICD-10-CM | POA: Insufficient documentation

## 2016-01-22 DIAGNOSIS — J439 Emphysema, unspecified: Secondary | ICD-10-CM | POA: Diagnosis not present

## 2016-01-22 DIAGNOSIS — Z5111 Encounter for antineoplastic chemotherapy: Secondary | ICD-10-CM

## 2016-01-22 DIAGNOSIS — C3411 Malignant neoplasm of upper lobe, right bronchus or lung: Secondary | ICD-10-CM | POA: Insufficient documentation

## 2016-01-22 MED ORDER — IOPAMIDOL (ISOVUE-300) INJECTION 61%
100.0000 mL | Freq: Once | INTRAVENOUS | Status: AC | PRN
  Administered 2016-01-22: 100 mL via INTRAVENOUS

## 2016-01-26 ENCOUNTER — Other Ambulatory Visit (HOSPITAL_BASED_OUTPATIENT_CLINIC_OR_DEPARTMENT_OTHER): Payer: BLUE CROSS/BLUE SHIELD

## 2016-01-26 ENCOUNTER — Ambulatory Visit (HOSPITAL_BASED_OUTPATIENT_CLINIC_OR_DEPARTMENT_OTHER): Payer: BLUE CROSS/BLUE SHIELD | Admitting: Internal Medicine

## 2016-01-26 ENCOUNTER — Ambulatory Visit (HOSPITAL_BASED_OUTPATIENT_CLINIC_OR_DEPARTMENT_OTHER): Payer: BLUE CROSS/BLUE SHIELD

## 2016-01-26 ENCOUNTER — Encounter: Payer: Self-pay | Admitting: Internal Medicine

## 2016-01-26 ENCOUNTER — Encounter: Payer: Self-pay | Admitting: *Deleted

## 2016-01-26 VITALS — BP 111/47 | HR 66 | Temp 97.8°F | Resp 20 | Ht 64.0 in | Wt 163.0 lb

## 2016-01-26 DIAGNOSIS — C3411 Malignant neoplasm of upper lobe, right bronchus or lung: Secondary | ICD-10-CM | POA: Diagnosis not present

## 2016-01-26 DIAGNOSIS — Z5111 Encounter for antineoplastic chemotherapy: Secondary | ICD-10-CM

## 2016-01-26 DIAGNOSIS — C7931 Secondary malignant neoplasm of brain: Secondary | ICD-10-CM | POA: Diagnosis not present

## 2016-01-26 DIAGNOSIS — F411 Generalized anxiety disorder: Secondary | ICD-10-CM

## 2016-01-26 DIAGNOSIS — C7949 Secondary malignant neoplasm of other parts of nervous system: Secondary | ICD-10-CM

## 2016-01-26 LAB — COMPREHENSIVE METABOLIC PANEL
ALT: 11 U/L (ref 0–55)
AST: 10 U/L (ref 5–34)
Albumin: 3.6 g/dL (ref 3.5–5.0)
Alkaline Phosphatase: 91 U/L (ref 40–150)
Anion Gap: 10 mEq/L (ref 3–11)
BUN: 16.1 mg/dL (ref 7.0–26.0)
CO2: 24 mEq/L (ref 22–29)
Calcium: 9.5 mg/dL (ref 8.4–10.4)
Chloride: 107 mEq/L (ref 98–109)
Creatinine: 0.8 mg/dL (ref 0.6–1.1)
EGFR: 75 mL/min/{1.73_m2} — ABNORMAL LOW (ref >90)
Glucose: 124 mg/dl (ref 70–140)
Potassium: 4.1 mEq/L (ref 3.5–5.1)
Sodium: 140 mEq/L (ref 136–145)
Total Bilirubin: 0.36 mg/dL (ref 0.20–1.20)
Total Protein: 7 g/dL (ref 6.4–8.3)

## 2016-01-26 LAB — CBC WITH DIFFERENTIAL/PLATELET
BASO%: 0.1 % (ref 0.0–2.0)
Basophils Absolute: 0 10*3/uL (ref 0.0–0.1)
EOS%: 0 % (ref 0.0–7.0)
Eosinophils Absolute: 0 10*3/uL (ref 0.0–0.5)
HCT: 38.9 % (ref 34.8–46.6)
HGB: 12.7 g/dL (ref 11.6–15.9)
LYMPH%: 3.8 % — ABNORMAL LOW (ref 14.0–49.7)
MCH: 32.7 pg (ref 25.1–34.0)
MCHC: 32.6 g/dL (ref 31.5–36.0)
MCV: 100.3 fL (ref 79.5–101.0)
MONO#: 0.7 10*3/uL (ref 0.1–0.9)
MONO%: 7.2 % (ref 0.0–14.0)
NEUT#: 8.3 10*3/uL — ABNORMAL HIGH (ref 1.5–6.5)
NEUT%: 88.9 % — ABNORMAL HIGH (ref 38.4–76.8)
Platelets: 282 10*3/uL (ref 145–400)
RBC: 3.88 10*6/uL (ref 3.70–5.45)
RDW: 14.8 % — ABNORMAL HIGH (ref 11.2–14.5)
WBC: 9.3 10*3/uL (ref 3.9–10.3)
lymph#: 0.4 10*3/uL — ABNORMAL LOW (ref 0.9–3.3)

## 2016-01-26 MED ORDER — SODIUM CHLORIDE 0.9 % IV SOLN
Freq: Once | INTRAVENOUS | Status: AC
  Administered 2016-01-26: 09:00:00 via INTRAVENOUS

## 2016-01-26 MED ORDER — ALPRAZOLAM 0.25 MG PO TABS
0.2500 mg | ORAL_TABLET | Freq: Three times a day (TID) | ORAL | Status: DC | PRN
Start: 2016-01-26 — End: 2016-03-07

## 2016-01-26 MED ORDER — SODIUM CHLORIDE 0.9 % IV SOLN
Freq: Once | INTRAVENOUS | Status: AC
  Administered 2016-01-26: 09:00:00 via INTRAVENOUS
  Filled 2016-01-26: qty 4

## 2016-01-26 MED ORDER — PEMETREXED DISODIUM CHEMO INJECTION 500 MG
500.0000 mg/m2 | Freq: Once | INTRAVENOUS | Status: AC
  Administered 2016-01-26: 900 mg via INTRAVENOUS
  Filled 2016-01-26: qty 32

## 2016-01-26 NOTE — Progress Notes (Signed)
Oncology Nurse Navigator Documentation  Oncology Nurse Navigator Flowsheets 01/26/2016  Navigator Location [facility]  Navigator Encounter Type Clinic/[facility]  Patient Visit Type MedOnc  Treatment Phase Treatment  Barriers/Navigation Needs No barriers at this time  Acuity Level 1  Time Spent with Patient 82   Spoke with patient and husband today.  She is doing well.  She received scan results and patient has stable disease.  She was excited about her news.  I gave her support.

## 2016-01-26 NOTE — Addendum Note (Signed)
Addended by: Lucile Crater on: 01/26/2016 08:56 AM   Modules accepted: Medications

## 2016-01-26 NOTE — Patient Instructions (Signed)
[facility] Discharge Instructions for Patients Receiving Chemotherapy  Today you received the following chemotherapy agents Alimta.  To help prevent nausea and vomiting after your treatment, we encourage you to take your nausea medication as prescribed.   If you develop nausea and vomiting that is not controlled by your nausea medication, call the clinic.   BELOW ARE SYMPTOMS THAT SHOULD BE REPORTED IMMEDIATELY:  *FEVER GREATER THAN 100.5 F  *CHILLS WITH OR WITHOUT FEVER  NAUSEA AND VOMITING THAT IS NOT CONTROLLED WITH YOUR NAUSEA MEDICATION  *UNUSUAL SHORTNESS OF BREATH  *UNUSUAL BRUISING OR BLEEDING  TENDERNESS IN MOUTH AND THROAT WITH OR WITHOUT PRESENCE OF ULCERS  *URINARY PROBLEMS  *BOWEL PROBLEMS  UNUSUAL RASH Items with * indicate a potential emergency and should be followed up as soon as possible.  Feel free to call the clinic you have any questions or concerns. The clinic phone number is [phone].  Please show the CHEMO ALERT CARD at check-in to the Emergency Department and triage nurse.   

## 2016-01-26 NOTE — Progress Notes (Signed)
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  PRIOR THERAPY:  1) Status post stereotactic radiotherapy to a solitary right parietal brain lesion under the care of Dr. [name] on 10/16/2013. 2) Status post palliative radiotherapy to the right lung tumor under the care of Dr. [name] completed on 12/05/2013. 3) Systemic chemotherapy with carboplatin for AUC of 5 and Alimta 500 mg/M2 every 3 weeks. First dose Jan 06 2014. Status post 6 cycles.  CURRENT THERAPY: Systemic chemotherapy with maintenance Alimta 500 mg/M2 every 3 weeks. Status post 28 cycles.  INTERVAL HISTORY: Alexis [name] 65 y.o. female returns to the clinic today for 3 weeks followup visit accompanied by her husband. The patient is feeling fine today and denied having any specific complaints. She status post 28 cycles of maintenance chemotherapy. She is tolerating her maintenance chemotherapy with Alimta fairly well. She denied having any significant chest pain, shortness of breath, cough or hemoptysis. She has no fever or chills, no nausea or vomiting. She denied having any significant weight loss or night sweats. She had repeat CT scan of the chest, abdomen and pelvis performed recently and she is here for evaluation and discussion of her scan results.  MEDICAL HISTORY: Past Medical History  Diagnosis Date  . Ileus, postoperative 11/18/2013  . Physical deconditioning 11/18/2013  . Severe protein-calorie malnutrition ([place]) 11/18/2013  . Anemia  11/18/2013  . History of radiation therapy 10-28-13- 12-05-13    lung ca 50 Gy/23f  . Anxiety   . Hx of radiation therapy 10/16/13    rt parietal brain/20Gy  . Encounter for antineoplastic chemotherapy 07/20/2015  . 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  MEDICATIONS:  Current Outpatient Prescriptions  Medication Sig Dispense Refill  . acetaminophen (TYLENOL) 500 MG tablet Take 500 mg by mouth every 6 (six) hours as needed for mild pain or headache. Reported on 11/02/2015    . ALPRAZolam (XANAX) 0.25 MG tablet Take 1 tablet (0.25 mg total) by mouth 3 (three) times daily as needed. for anxiety 45 tablet 0  . Ascorbic Acid (VITAMIN C GUMMIE PO) Take 1 each by mouth every morning.    .Marland Kitchendexamethasone (DECADRON) 4 MG tablet 4 mg by mouth twice a day the day before, day of and day after the chemotherapy every 3 weeks 40 tablet 1  . folic acid (FOLVITE) 1 MG tablet TAKE 1 TABLET (1 MG TOTAL) BY MOUTH DAILY. 30 tablet 0  . Multiple Vitamin (MULTIVITAMIN WITH MINERALS) TABS tablet Take 1 tablet by mouth every morning.    .Marland Kitchenomeprazole (PRILOSEC) 20 MG capsule Take 1 capsule (20 mg total) by mouth daily. As needed for reflux or indigestion. 42 capsule PRN  . ondansetron (ZOFRAN) 8 MG tablet Take 1 tablet (8 mg total) by mouth every 8 (eight) hours as needed for nausea or vomiting. 30 tablet 0  . OVER THE COUNTER MEDICATION Take 1 tablet by mouth every  morning. (Vitamin A)    . pentoxifylline (TRENTAL) 400 MG CR tablet TAKE ONE TABLET BY MOUTH TWICE DAILY, ALSO  TAKE  VITAMIN  E  400  UNITS  TWICE  DAILY  WITH  PENTOXIFYLLINE 60 tablet 0  . pentoxifylline (TRENTAL) 400 MG CR tablet Take 1 tablet (400 mg total) by mouth 2 (two) times daily. Patient is also to take vitamin E 400 mg BID with trental. 60 tablet 3  . prochlorperazine (COMPAZINE) 10 MG tablet Take 1 tablet (10 mg total) by mouth every 6 (six) hours as needed  for nausea or vomiting. 60 tablet 0  . senna-docusate (SENOKOT-S) 8.6-50 MG tablet Take 1 tablet by mouth daily. 30 tablet prn  . Zinc 50 MG TABS Take 1 tablet by mouth every morning.     No current facility-administered medications for this visit.    SURGICAL HISTORY:  Past Surgical History  Procedure Laterality Date  . Video bronchoscopy Bilateral 08/30/2013    Procedure: VIDEO BRONCHOSCOPY WITH FLUORO;  Surgeon: Tanda Rockers, MD;  Location: [facility];  Service: Cardiopulmonary;  Laterality: Bilateral;  . Abdominal hysterectomy    . Laparotomy N/A 11/03/2013    Procedure: EXPLORATORY LAPAROTOMY, DRAINAGE OF INTRA  ABDOMINAL ABSCESSES, MOBILIZATION OF SPLENIC FLEXURE, SIGMOID COLECTOMY WITH COLOSTOMY;  Surgeon: Odis Hollingshead, MD;  Location: [facility];  Service: General;  Laterality: N/A;  . Colostomy takedown N/A 07/10/2014    Procedure: LAPAROSCOPIC LYSIS OF ADHESIONS (90 MIN) LAPAROSCOPIC ASSISTED COLOSTOMY CLOSURE, RIGID PROCTOSIGMOIDOSCOPY;  Surgeon: Jackolyn Confer, MD;  Location: [facility];  Service: General;  Laterality: N/A;    REVIEW OF SYSTEMS:  Constitutional: negative Eyes: negative Ears, nose, mouth, throat, and face: negative Respiratory: negative Cardiovascular: negative Gastrointestinal: negative Genitourinary:negative Integument/breast: negative Hematologic/lymphatic: negative Musculoskeletal:negative Neurological: negative Behavioral/Psych: negative Endocrine: negative Allergic/Immunologic: negative   PHYSICAL EXAMINATION: General appearance: alert, cooperative, fatigued and no distress Head: Normocephalic, without obvious abnormality, atraumatic Neck: no adenopathy, no JVD, supple, symmetrical, trachea midline and thyroid not enlarged, symmetric, no tenderness/mass/nodules Lymph nodes: Cervical, supraclavicular, and axillary nodes normal. Resp: clear to auscultation bilaterally Back: symmetric, no curvature. ROM normal. No CVA tenderness. Cardio: regular  rate and rhythm, S1, S2 normal, no murmur, click, rub or gallop GI: soft, non-tender; bowel sounds normal; no masses,  no organomegaly Extremities: extremities normal, atraumatic, no cyanosis or edema Neurologic: Alert and oriented X 3, normal strength and tone. Normal symmetric reflexes. Normal coordination and gait  ECOG PERFORMANCE STATUS: 0 - Asymptomatic  Blood pressure 111/47, pulse 66, temperature 97.8 F (36.6 C), temperature source Oral, resp. rate 20, height '5\' 4"'$  (1.626 m), weight 163 lb (73.936 kg), SpO2 100 %.  LABORATORY DATA: Lab Results  Component Value Date   WBC 9.3 01/26/2016   HGB 12.7 01/26/2016   HCT 38.9 01/26/2016   MCV 100.3 01/26/2016   PLT 282 01/26/2016      Chemistry      Component Value Date/Time   NA 142 01/04/2016 0825   NA 139 07/12/2014 0512   K 3.6 01/04/2016 0825   K 3.9 07/12/2014 0512   CL 100 07/12/2014 0512   CO2 25 01/04/2016 0825   CO2 32 07/12/2014 0512   BUN 12.8 01/04/2016 0825   BUN 5* 07/12/2014 0512   CREATININE 0.8 01/04/2016 0825   CREATININE 0.55 07/12/2014 0512      Component Value Date/Time   CALCIUM 9.6 01/04/2016 0825   CALCIUM 9.0 07/12/2014 0512   ALKPHOS 93 01/04/2016 0825   ALKPHOS 91 07/10/2014  0955   AST 11 01/04/2016 0825   AST 15 07/10/2014 0955   ALT 24 01/04/2016 0825   ALT 8 07/10/2014 0955   BILITOT 0.34 01/04/2016 0825   BILITOT 0.4 07/10/2014 0955       RADIOGRAPHIC STUDIES: Ct Chest W Contrast  01/22/2016  CLINICAL DATA:  Right upper lobe lung adenocarcinoma diagnosed 08/30/2013 presents for restaging status post radiation therapy and chemotherapy. EXAM: CT CHEST, ABDOMEN, AND PELVIS WITH CONTRAST TECHNIQUE: Multidetector CT imaging of the chest, abdomen and pelvis was performed following the standard protocol during bolus administration of intravenous contrast. CONTRAST:  12m ISOVUE-300 IOPAMIDOL (ISOVUE-300) INJECTION 61% COMPARISON:  11/19/2015 CT chest, abdomen and pelvis. FINDINGS: CT  CHEST Mediastinum/Nodes: Normal heart size. No pericardial fluid/thickening. Atherosclerotic nonaneurysmal thoracic aorta. Normal caliber pulmonary arteries. No central pulmonary emboli. Normal visualized thyroid. Normal esophagus. No axillary adenopathy. Mildly enlarged 1.0 cm right hilar node (series 2/ image 24), unchanged. No pathologically enlarged mediastinal or left hilar nodes. Lungs/Pleura: No pneumothorax. No pleural effusion. Mild centrilobular and paraseptal emphysema with diffuse bronchial wall thickening. Posterior apical right upper lobe 2.5 x 1.5 cm pulmonary nodule (series 4/ image 26), previously 2.5 x 1.5 cm, unchanged. Stable mild distortion and reticulation surrounding the apical right upper lobe pulmonary nodule, consistent with treatment change. Stable 3 mm subpleural anterior right lower lobe pulmonary nodule associated with the right major fissure (series 4/ image 90), unchanged back to 08/19/2013 and benign. Pleural-parenchymal scarring in the apical left upper lobe is stable back to 08/19/2013 and benign. No acute consolidative airspace disease or new significant pulmonary nodules. Musculoskeletal: No aggressive appearing focal osseous lesions. Mild-to-moderate degenerative changes in the thoracic spine. CT ABDOMEN AND PELVIS Hepatobiliary: Normal liver with no liver mass. Normal gallbladder with no radiopaque cholelithiasis. No biliary ductal dilatation. Pancreas: Normal, with no mass or duct dilation. Spleen: Normal size. No mass. Adrenals/Urinary Tract: Normal adrenals. No hydronephrosis. No renal mass. Normal bladder. Stomach/Bowel: Grossly normal stomach. Normal caliber small bowel with no small bowel wall thickening. Normal appendix. Stable subtotal sigmoid colectomy with intact appearing distal colonic anastomosis. No large bowel wall thickening or pericolonic fat stranding. Oral contrast progresses to the distal rectum. Vascular/Lymphatic: Atherosclerotic nonaneurysmal abdominal  aorta. Patent portal, splenic and renal veins. No pathologically enlarged lymph nodes in the abdomen or pelvis. Reproductive: Status post hysterectomy, with no abnormal findings at the vaginal cuff. No adnexal mass. Other: No pneumoperitoneum, ascites or focal fluid collection. Musculoskeletal: No aggressive appearing focal osseous lesions. Stable small right lower periumbilical fat containing hernia. Mild degenerative changes in the lumbar spine. IMPRESSION: 1. Stable treated apical right upper lobe pulmonary nodule. 2. Stable mildly enlarged right hilar node. No new or progressive metastatic disease in the chest. 3. No metastatic disease in the abdomen or pelvis. 4. Mild emphysema and diffuse bronchial wall thickening, suggesting COPD. Electronically Signed   By: JIlona SorrelM.D.   On: 01/22/2016 09:32   Ct Abdomen Pelvis W Contrast  01/22/2016  CLINICAL DATA:  Right upper lobe lung adenocarcinoma diagnosed 08/30/2013 presents for restaging status post radiation therapy and chemotherapy. EXAM: CT CHEST, ABDOMEN, AND PELVIS WITH CONTRAST TECHNIQUE: Multidetector CT imaging of the chest, abdomen and pelvis was performed following the standard protocol during bolus administration of intravenous contrast. CONTRAST:  1087mISOVUE-300 IOPAMIDOL (ISOVUE-300) INJECTION 61% COMPARISON:  11/19/2015 CT chest, abdomen and pelvis. FINDINGS: CT CHEST Mediastinum/Nodes: Normal heart size. No pericardial fluid/thickening. Atherosclerotic nonaneurysmal thoracic aorta. Normal caliber pulmonary arteries. No central pulmonary emboli. Normal visualized thyroid.  Normal esophagus. No axillary adenopathy. Mildly enlarged 1.0 cm right hilar node (series 2/ image 24), unchanged. No pathologically enlarged mediastinal or left hilar nodes. Lungs/Pleura: No pneumothorax. No pleural effusion. Mild centrilobular and paraseptal emphysema with diffuse bronchial wall thickening. Posterior apical right upper lobe 2.5 x 1.5 cm pulmonary nodule  (series 4/ image 26), previously 2.5 x 1.5 cm, unchanged. Stable mild distortion and reticulation surrounding the apical right upper lobe pulmonary nodule, consistent with treatment change. Stable 3 mm subpleural anterior right lower lobe pulmonary nodule associated with the right major fissure (series 4/ image 90), unchanged back to 08/19/2013 and benign. Pleural-parenchymal scarring in the apical left upper lobe is stable back to 08/19/2013 and benign. No acute consolidative airspace disease or new significant pulmonary nodules. Musculoskeletal: No aggressive appearing focal osseous lesions. Mild-to-moderate degenerative changes in the thoracic spine. CT ABDOMEN AND PELVIS Hepatobiliary: Normal liver with no liver mass. Normal gallbladder with no radiopaque cholelithiasis. No biliary ductal dilatation. Pancreas: Normal, with no mass or duct dilation. Spleen: Normal size. No mass. Adrenals/Urinary Tract: Normal adrenals. No hydronephrosis. No renal mass. Normal bladder. Stomach/Bowel: Grossly normal stomach. Normal caliber small bowel with no small bowel wall thickening. Normal appendix. Stable subtotal sigmoid colectomy with intact appearing distal colonic anastomosis. No large bowel wall thickening or pericolonic fat stranding. Oral contrast progresses to the distal rectum. Vascular/Lymphatic: Atherosclerotic nonaneurysmal abdominal aorta. Patent portal, splenic and renal veins. No pathologically enlarged lymph nodes in the abdomen or pelvis. Reproductive: Status post hysterectomy, with no abnormal findings at the vaginal cuff. No adnexal mass. Other: No pneumoperitoneum, ascites or focal fluid collection. Musculoskeletal: No aggressive appearing focal osseous lesions. Stable small right lower periumbilical fat containing hernia. Mild degenerative changes in the lumbar spine. IMPRESSION: 1. Stable treated apical right upper lobe pulmonary nodule. 2. Stable mildly enlarged right hilar node. No new or progressive  metastatic disease in the chest. 3. No metastatic disease in the abdomen or pelvis. 4. Mild emphysema and diffuse bronchial wall thickening, suggesting COPD. Electronically Signed   By: Ilona Sorrel M.D.   On: 01/22/2016 09:32    ASSESSMENT AND PLAN: This is a very pleasant 65 years old white female recently diagnosed with a stage IV non-small cell lung cancer, adenocarcinoma with negative EGFR mutation and negative ALK gene translocation. The patient is status post stereotactic radiotherapy to a solitary brain metastases in addition to palliative radiotherapy to the right upper lobe lung mass. She status post 6 cycles of systemic chemotherapy with carboplatin and Alimta with improvement in her disease and she is currently undergoing maintenance chemotherapy with single agent Alimta status post 28 cycles and tolerating her treatment fairly well. The recent CT scan of the chest, abdomen and pelvis showed no evidence for disease progression. I discussed the scan results with the patient and her husband today. I recommended for her to continue her current maintenance treatment with single agent Alimta 500 mg/M2 every 3 weeks. She will proceed with cycle #29 today. The patient would come back for follow-up visit in 3 weeks for evaluation before starting cycle #30. The patient was advised to call immediately if she has any concerning symptoms in the interval. The patient voices understanding of current disease status and treatment options and is in agreement with the current care plan.  All questions were answered. The patient knows to call the clinic with any problems, questions or concerns. We can certainly see the patient much sooner if necessary.  Disclaimer: This note was dictated with voice recognition  software. Similar sounding words can inadvertently be transcribed and may not be corrected upon review.

## 2016-02-10 ENCOUNTER — Other Ambulatory Visit: Payer: Self-pay | Admitting: Radiation Therapy

## 2016-02-10 DIAGNOSIS — C7949 Secondary malignant neoplasm of other parts of nervous system: Principal | ICD-10-CM

## 2016-02-10 DIAGNOSIS — C7931 Secondary malignant neoplasm of brain: Secondary | ICD-10-CM

## 2016-02-15 ENCOUNTER — Encounter: Payer: Self-pay | Admitting: Internal Medicine

## 2016-02-15 ENCOUNTER — Telehealth: Payer: Self-pay | Admitting: Internal Medicine

## 2016-02-15 ENCOUNTER — Ambulatory Visit (HOSPITAL_BASED_OUTPATIENT_CLINIC_OR_DEPARTMENT_OTHER): Payer: BLUE CROSS/BLUE SHIELD

## 2016-02-15 ENCOUNTER — Other Ambulatory Visit (HOSPITAL_BASED_OUTPATIENT_CLINIC_OR_DEPARTMENT_OTHER): Payer: BLUE CROSS/BLUE SHIELD

## 2016-02-15 ENCOUNTER — Ambulatory Visit (HOSPITAL_BASED_OUTPATIENT_CLINIC_OR_DEPARTMENT_OTHER): Payer: BLUE CROSS/BLUE SHIELD | Admitting: Internal Medicine

## 2016-02-15 VITALS — BP 108/48 | HR 76 | Temp 98.3°F | Resp 18 | Ht 64.0 in | Wt 161.5 lb

## 2016-02-15 DIAGNOSIS — C3411 Malignant neoplasm of upper lobe, right bronchus or lung: Secondary | ICD-10-CM

## 2016-02-15 DIAGNOSIS — Z5111 Encounter for antineoplastic chemotherapy: Secondary | ICD-10-CM

## 2016-02-15 DIAGNOSIS — C7949 Secondary malignant neoplasm of other parts of nervous system: Principal | ICD-10-CM

## 2016-02-15 DIAGNOSIS — C7931 Secondary malignant neoplasm of brain: Secondary | ICD-10-CM | POA: Diagnosis not present

## 2016-02-15 LAB — COMPREHENSIVE METABOLIC PANEL
ALT: 15 U/L (ref 0–55)
AST: 10 U/L (ref 5–34)
Albumin: 3.6 g/dL (ref 3.5–5.0)
Alkaline Phosphatase: 89 U/L (ref 40–150)
Anion Gap: 11 mEq/L (ref 3–11)
BUN: 15.3 mg/dL (ref 7.0–26.0)
CO2: 23 mEq/L (ref 22–29)
Calcium: 9.4 mg/dL (ref 8.4–10.4)
Chloride: 108 mEq/L (ref 98–109)
Creatinine: 0.8 mg/dL (ref 0.6–1.1)
EGFR: 77 mL/min/{1.73_m2} — ABNORMAL LOW (ref >90)
Glucose: 135 mg/dl (ref 70–140)
Potassium: 3.7 mEq/L (ref 3.5–5.1)
Sodium: 141 mEq/L (ref 136–145)
Total Bilirubin: 0.43 mg/dL (ref 0.20–1.20)
Total Protein: 7.2 g/dL (ref 6.4–8.3)

## 2016-02-15 LAB — CBC WITH DIFFERENTIAL/PLATELET
BASO%: 0.1 % (ref 0.0–2.0)
Basophils Absolute: 0 10*3/uL (ref 0.0–0.1)
EOS%: 0 % (ref 0.0–7.0)
Eosinophils Absolute: 0 10*3/uL (ref 0.0–0.5)
HCT: 35.9 % (ref 34.8–46.6)
HGB: 12.1 g/dL (ref 11.6–15.9)
LYMPH%: 5.9 % — ABNORMAL LOW (ref 14.0–49.7)
MCH: 32.9 pg (ref 25.1–34.0)
MCHC: 33.7 g/dL (ref 31.5–36.0)
MCV: 97.5 fL (ref 79.5–101.0)
MONO#: 0.6 10*3/uL (ref 0.1–0.9)
MONO%: 8 % (ref 0.0–14.0)
NEUT#: 6 10*3/uL (ref 1.5–6.5)
NEUT%: 86 % — ABNORMAL HIGH (ref 38.4–76.8)
Platelets: 255 10*3/uL (ref 145–400)
RBC: 3.68 10*6/uL — ABNORMAL LOW (ref 3.70–5.45)
RDW: 14.5 % (ref 11.2–14.5)
WBC: 7 10*3/uL (ref 3.9–10.3)
lymph#: 0.4 10*3/uL — ABNORMAL LOW (ref 0.9–3.3)

## 2016-02-15 MED ORDER — SODIUM CHLORIDE 0.9 % IV SOLN
500.0000 mg/m2 | Freq: Once | INTRAVENOUS | Status: AC
  Administered 2016-02-15: 900 mg via INTRAVENOUS
  Filled 2016-02-15: qty 32

## 2016-02-15 MED ORDER — CYANOCOBALAMIN 1000 MCG/ML IJ SOLN
INTRAMUSCULAR | Status: AC
  Filled 2016-02-15: qty 1

## 2016-02-15 MED ORDER — SODIUM CHLORIDE 0.9 % IV SOLN
Freq: Once | INTRAVENOUS | Status: AC
  Administered 2016-02-15: 09:00:00 via INTRAVENOUS
  Filled 2016-02-15: qty 4

## 2016-02-15 MED ORDER — SODIUM CHLORIDE 0.9 % IV SOLN
Freq: Once | INTRAVENOUS | Status: AC
  Administered 2016-02-15: 09:00:00 via INTRAVENOUS

## 2016-02-15 MED ORDER — CYANOCOBALAMIN 1000 MCG/ML IJ SOLN
1000.0000 ug | Freq: Once | INTRAMUSCULAR | Status: AC
  Administered 2016-02-15: 1000 ug via INTRAMUSCULAR

## 2016-02-15 NOTE — Telephone Encounter (Signed)
Gave and pritned appt sched and avs for pt for July and Aug

## 2016-02-15 NOTE — Patient Instructions (Signed)
[facility] Discharge Instructions for Patients Receiving Chemotherapy  Today you received the following chemotherapy agents Alimta.  To help prevent nausea and vomiting after your treatment, we encourage you to take your nausea medication as prescribed.   If you develop nausea and vomiting that is not controlled by your nausea medication, call the clinic.   BELOW ARE SYMPTOMS THAT SHOULD BE REPORTED IMMEDIATELY:  *FEVER GREATER THAN 100.5 F  *CHILLS WITH OR WITHOUT FEVER  NAUSEA AND VOMITING THAT IS NOT CONTROLLED WITH YOUR NAUSEA MEDICATION  *UNUSUAL SHORTNESS OF BREATH  *UNUSUAL BRUISING OR BLEEDING  TENDERNESS IN MOUTH AND THROAT WITH OR WITHOUT PRESENCE OF ULCERS  *URINARY PROBLEMS  *BOWEL PROBLEMS  UNUSUAL RASH Items with * indicate a potential emergency and should be followed up as soon as possible.  Feel free to call the clinic you have any questions or concerns. The clinic phone number is [phone].  Please show the CHEMO ALERT CARD at check-in to the Emergency Department and triage nurse.   

## 2016-02-15 NOTE — Telephone Encounter (Signed)
Gave and printed appt sched and avs for pt for July and Aug °

## 2016-02-15 NOTE — Progress Notes (Signed)
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  PRIOR THERAPY:  1) Status post stereotactic radiotherapy to a solitary right parietal brain lesion under the care of Dr. [name] on 10/16/2013. 2) Status post palliative radiotherapy to the right lung tumor under the care of Dr. [name] completed on 12/05/2013. 3) Systemic chemotherapy with carboplatin for AUC of 5 and Alimta 500 mg/M2 every 3 weeks. First dose Jan 06 2014. Status post 6 cycles.  CURRENT THERAPY: Systemic chemotherapy with maintenance Alimta 500 mg/M2 every 3 weeks. Status post 29 cycles.  INTERVAL HISTORY: Alexis [name] 65 y.o. female returns to the clinic today for 3 weeks followup visit accompanied by her husband. The patient is feeling fine today and denied having any specific complaints. She is tolerating her maintenance chemotherapy with Alimta fairly well. She denied having any significant chest pain, shortness of breath, cough or hemoptysis. She has no fever or chills, no nausea or vomiting. She denied having any significant weight loss or night sweats. She is here today to start cycle #30.  MEDICAL HISTORY: Past Medical History  Diagnosis Date  . Ileus, postoperative 11/18/2013  . Physical deconditioning 11/18/2013  . Severe protein-calorie malnutrition ([place]) 11/18/2013  . Anemia 11/18/2013  . History of radiation therapy 10-28-13- 12-05-13    lung ca 50 Gy/36f  . Anxiety   . Hx of radiation therapy 10/16/13    rt  parietal brain/20Gy  . Encounter for antineoplastic chemotherapy 07/20/2015  . 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  MEDICATIONS:  Current Outpatient Prescriptions  Medication Sig Dispense Refill  . acetaminophen (TYLENOL) 500 MG tablet Take 500 mg by mouth every 6 (six) hours as needed for mild pain or headache. Reported on 11/02/2015    . ALPRAZolam (XANAX) 0.25 MG tablet Take 1 tablet (0.25 mg total) by mouth 3 (three) times daily as needed. for anxiety 45 tablet 0  . Ascorbic Acid (VITAMIN C GUMMIE PO) Take 1 each by mouth every morning.    .Marland Kitchendexamethasone (DECADRON) 4 MG tablet 4 mg by mouth twice a day the day before, day of and day after the chemotherapy every 3 weeks 40 tablet 1  . folic acid (FOLVITE) 1 MG tablet TAKE 1 TABLET (1 MG TOTAL) BY MOUTH DAILY. 30 tablet 0  . Multiple Vitamin (MULTIVITAMIN WITH MINERALS) TABS tablet Take 1 tablet by mouth every morning.    .Marland Kitchenomeprazole (PRILOSEC) 20 MG capsule Take 1 capsule (20 mg total) by mouth daily. As needed for reflux or indigestion. 42 capsule PRN  . ondansetron (ZOFRAN) 8 MG tablet Take 1 tablet (8 mg total) by mouth every 8 (eight) hours as needed for nausea or vomiting. 30 tablet 0  . OVER THE COUNTER MEDICATION Take 1 tablet by mouth every morning. (Vitamin A)    . pentoxifylline (TRENTAL) 400 MG CR tablet TAKE ONE TABLET BY MOUTH TWICE DAILY,  ALSO  TAKE  VITAMIN  E  400  UNITS  TWICE  DAILY  WITH  PENTOXIFYLLINE 60 tablet 0  . pentoxifylline (TRENTAL) 400 MG CR tablet Take 1 tablet (400 mg total) by mouth 2 (two) times daily. Patient is also to take vitamin E 400 mg BID with trental. 60 tablet 3  . prochlorperazine (COMPAZINE) 10 MG tablet Take 1 tablet (10 mg total) by mouth every 6 (six) hours as needed for nausea or vomiting. 60 tablet 0  . senna-docusate (SENOKOT-S) 8.6-50 MG tablet Take 1 tablet by mouth daily. 30 tablet prn  . Zinc  50 MG TABS Take 1 tablet by mouth every morning.     No current facility-administered medications for this visit.    SURGICAL HISTORY:  Past Surgical History  Procedure Laterality Date  . Video bronchoscopy Bilateral 08/30/2013    Procedure: VIDEO BRONCHOSCOPY WITH FLUORO;  Surgeon: Tanda Rockers, MD;  Location: [facility];  Service: Cardiopulmonary;  Laterality: Bilateral;  . Abdominal hysterectomy    . Laparotomy N/A 11/03/2013    Procedure: EXPLORATORY LAPAROTOMY, DRAINAGE OF INTRA  ABDOMINAL ABSCESSES, MOBILIZATION OF SPLENIC FLEXURE, SIGMOID COLECTOMY WITH COLOSTOMY;  Surgeon: Odis Hollingshead, MD;  Location: [facility];  Service: General;  Laterality: N/A;  . Colostomy takedown N/A 07/10/2014    Procedure: LAPAROSCOPIC LYSIS OF ADHESIONS (90 MIN) LAPAROSCOPIC ASSISTED COLOSTOMY CLOSURE, RIGID PROCTOSIGMOIDOSCOPY;  Surgeon: Jackolyn Confer, MD;  Location: [facility];  Service: General;  Laterality: N/A;    REVIEW OF SYSTEMS:  A comprehensive review of systems was negative.   PHYSICAL EXAMINATION: General appearance: alert, cooperative, fatigued and no distress Head: Normocephalic, without obvious abnormality, atraumatic Neck: no adenopathy, no JVD, supple, symmetrical, trachea midline and thyroid not enlarged, symmetric, no tenderness/mass/nodules Lymph nodes: Cervical, supraclavicular, and axillary nodes normal. Resp: clear to auscultation bilaterally Back: symmetric, no curvature. ROM normal. No CVA tenderness. Cardio: regular rate and rhythm, S1, S2 normal, no murmur, click, rub or gallop GI: soft, non-tender; bowel sounds normal; no masses,  no organomegaly Extremities: extremities normal, atraumatic, no cyanosis or edema Neurologic: Alert and oriented X 3, normal strength and tone. Normal symmetric reflexes. Normal coordination and gait  ECOG PERFORMANCE STATUS: 0 - Asymptomatic  Blood pressure 108/48, pulse 76, temperature 98.3 F (36.8 C), temperature source Oral, resp. rate 18,  height 5' 4" (1.626 m), weight 161 lb 8 oz (73.256 kg), SpO2 100 %.  LABORATORY DATA: Lab Results  Component Value Date   WBC 7.0 02/15/2016   HGB 12.1 02/15/2016   HCT 35.9 02/15/2016   MCV 97.5 02/15/2016   PLT 255 02/15/2016      Chemistry      Component Value Date/Time   NA 140 01/26/2016 0812   NA 139 07/12/2014 0512   K 4.1 01/26/2016 0812   K 3.9 07/12/2014 0512   CL 100 07/12/2014 0512   CO2 24 01/26/2016 0812   CO2 32 07/12/2014 0512   BUN 16.1 01/26/2016 0812   BUN 5* 07/12/2014 0512   CREATININE 0.8 01/26/2016 0812   CREATININE 0.55 07/12/2014 0512      Component Value Date/Time   CALCIUM 9.5 01/26/2016 0812   CALCIUM 9.0 07/12/2014 0512   ALKPHOS 91 01/26/2016 0812   ALKPHOS 91 07/10/2014 0955   AST 10 01/26/2016 0812   AST 15 07/10/2014 0955   ALT 11 01/26/2016 0812   ALT 8 07/10/2014 0955   BILITOT 0.36 01/26/2016 0812   BILITOT 0.4 07/10/2014 0955  RADIOGRAPHIC STUDIES: Ct Chest W Contrast  01/22/2016  CLINICAL DATA:  Right upper lobe lung adenocarcinoma diagnosed 08/30/2013 presents for restaging status post radiation therapy and chemotherapy. EXAM: CT CHEST, ABDOMEN, AND PELVIS WITH CONTRAST TECHNIQUE: Multidetector CT imaging of the chest, abdomen and pelvis was performed following the standard protocol during bolus administration of intravenous contrast. CONTRAST:  156m ISOVUE-300 IOPAMIDOL (ISOVUE-300) INJECTION 61% COMPARISON:  11/19/2015 CT chest, abdomen and pelvis. FINDINGS: CT CHEST Mediastinum/Nodes: Normal heart size. No pericardial fluid/thickening. Atherosclerotic nonaneurysmal thoracic aorta. Normal caliber pulmonary arteries. No central pulmonary emboli. Normal visualized thyroid. Normal esophagus. No axillary adenopathy. Mildly enlarged 1.0 cm right hilar node (series 2/ image 24), unchanged. No pathologically enlarged mediastinal or left hilar nodes. Lungs/Pleura: No pneumothorax. No pleural effusion. Mild centrilobular and paraseptal  emphysema with diffuse bronchial wall thickening. Posterior apical right upper lobe 2.5 x 1.5 cm pulmonary nodule (series 4/ image 26), previously 2.5 x 1.5 cm, unchanged. Stable mild distortion and reticulation surrounding the apical right upper lobe pulmonary nodule, consistent with treatment change. Stable 3 mm subpleural anterior right lower lobe pulmonary nodule associated with the right major fissure (series 4/ image 90), unchanged back to 08/19/2013 and benign. Pleural-parenchymal scarring in the apical left upper lobe is stable back to 08/19/2013 and benign. No acute consolidative airspace disease or new significant pulmonary nodules. Musculoskeletal: No aggressive appearing focal osseous lesions. Mild-to-moderate degenerative changes in the thoracic spine. CT ABDOMEN AND PELVIS Hepatobiliary: Normal liver with no liver mass. Normal gallbladder with no radiopaque cholelithiasis. No biliary ductal dilatation. Pancreas: Normal, with no mass or duct dilation. Spleen: Normal size. No mass. Adrenals/Urinary Tract: Normal adrenals. No hydronephrosis. No renal mass. Normal bladder. Stomach/Bowel: Grossly normal stomach. Normal caliber small bowel with no small bowel wall thickening. Normal appendix. Stable subtotal sigmoid colectomy with intact appearing distal colonic anastomosis. No large bowel wall thickening or pericolonic fat stranding. Oral contrast progresses to the distal rectum. Vascular/Lymphatic: Atherosclerotic nonaneurysmal abdominal aorta. Patent portal, splenic and renal veins. No pathologically enlarged lymph nodes in the abdomen or pelvis. Reproductive: Status post hysterectomy, with no abnormal findings at the vaginal cuff. No adnexal mass. Other: No pneumoperitoneum, ascites or focal fluid collection. Musculoskeletal: No aggressive appearing focal osseous lesions. Stable small right lower periumbilical fat containing hernia. Mild degenerative changes in the lumbar spine. IMPRESSION: 1. Stable  treated apical right upper lobe pulmonary nodule. 2. Stable mildly enlarged right hilar node. No new or progressive metastatic disease in the chest. 3. No metastatic disease in the abdomen or pelvis. 4. Mild emphysema and diffuse bronchial wall thickening, suggesting COPD. Electronically Signed   By: JIlona SorrelM.D.   On: 01/22/2016 09:32   Ct Abdomen Pelvis W Contrast  01/22/2016  CLINICAL DATA:  Right upper lobe lung adenocarcinoma diagnosed 08/30/2013 presents for restaging status post radiation therapy and chemotherapy. EXAM: CT CHEST, ABDOMEN, AND PELVIS WITH CONTRAST TECHNIQUE: Multidetector CT imaging of the chest, abdomen and pelvis was performed following the standard protocol during bolus administration of intravenous contrast. CONTRAST:  1044mISOVUE-300 IOPAMIDOL (ISOVUE-300) INJECTION 61% COMPARISON:  11/19/2015 CT chest, abdomen and pelvis. FINDINGS: CT CHEST Mediastinum/Nodes: Normal heart size. No pericardial fluid/thickening. Atherosclerotic nonaneurysmal thoracic aorta. Normal caliber pulmonary arteries. No central pulmonary emboli. Normal visualized thyroid. Normal esophagus. No axillary adenopathy. Mildly enlarged 1.0 cm right hilar node (series 2/ image 24), unchanged. No pathologically enlarged mediastinal or left hilar nodes. Lungs/Pleura: No pneumothorax. No pleural effusion. Mild centrilobular and paraseptal emphysema with diffuse bronchial wall thickening. Posterior apical  right upper lobe 2.5 x 1.5 cm pulmonary nodule (series 4/ image 26), previously 2.5 x 1.5 cm, unchanged. Stable mild distortion and reticulation surrounding the apical right upper lobe pulmonary nodule, consistent with treatment change. Stable 3 mm subpleural anterior right lower lobe pulmonary nodule associated with the right major fissure (series 4/ image 90), unchanged back to 08/19/2013 and benign. Pleural-parenchymal scarring in the apical left upper lobe is stable back to 08/19/2013 and benign. No acute  consolidative airspace disease or new significant pulmonary nodules. Musculoskeletal: No aggressive appearing focal osseous lesions. Mild-to-moderate degenerative changes in the thoracic spine. CT ABDOMEN AND PELVIS Hepatobiliary: Normal liver with no liver mass. Normal gallbladder with no radiopaque cholelithiasis. No biliary ductal dilatation. Pancreas: Normal, with no mass or duct dilation. Spleen: Normal size. No mass. Adrenals/Urinary Tract: Normal adrenals. No hydronephrosis. No renal mass. Normal bladder. Stomach/Bowel: Grossly normal stomach. Normal caliber small bowel with no small bowel wall thickening. Normal appendix. Stable subtotal sigmoid colectomy with intact appearing distal colonic anastomosis. No large bowel wall thickening or pericolonic fat stranding. Oral contrast progresses to the distal rectum. Vascular/Lymphatic: Atherosclerotic nonaneurysmal abdominal aorta. Patent portal, splenic and renal veins. No pathologically enlarged lymph nodes in the abdomen or pelvis. Reproductive: Status post hysterectomy, with no abnormal findings at the vaginal cuff. No adnexal mass. Other: No pneumoperitoneum, ascites or focal fluid collection. Musculoskeletal: No aggressive appearing focal osseous lesions. Stable small right lower periumbilical fat containing hernia. Mild degenerative changes in the lumbar spine. IMPRESSION: 1. Stable treated apical right upper lobe pulmonary nodule. 2. Stable mildly enlarged right hilar node. No new or progressive metastatic disease in the chest. 3. No metastatic disease in the abdomen or pelvis. 4. Mild emphysema and diffuse bronchial wall thickening, suggesting COPD. Electronically Signed   By: Ilona Sorrel M.D.   On: 01/22/2016 09:32    ASSESSMENT AND PLAN: This is a very pleasant 65 years old white female recently diagnosed with a stage IV non-small cell lung cancer, adenocarcinoma with negative EGFR mutation and negative ALK gene translocation. The patient is status  post stereotactic radiotherapy to a solitary brain metastases in addition to palliative radiotherapy to the right upper lobe lung mass. She status post 6 cycles of systemic chemotherapy with carboplatin and Alimta with improvement in her disease and she is currently undergoing maintenance chemotherapy with single agent Alimta status post 29 cycles and tolerating her treatment fairly well. I recommended for her to continue her current maintenance treatment with single agent Alimta 500 mg/M2 every 3 weeks. She will proceed with cycle #30 today. The patient would come back for follow-up visit in 3 weeks for evaluation before starting cycle #31. The patient was advised to call immediately if she has any concerning symptoms in the interval. The patient voices understanding of current disease status and treatment options and is in agreement with the current care plan.  All questions were answered. The patient knows to call the clinic with any problems, questions or concerns. We can certainly see the patient much sooner if necessary.  Disclaimer: This note was dictated with voice recognition software. Similar sounding words can inadvertently be transcribed and may not be corrected upon review.

## 2016-02-16 ENCOUNTER — Ambulatory Visit: Payer: BLUE CROSS/BLUE SHIELD | Admitting: Internal Medicine

## 2016-02-16 ENCOUNTER — Other Ambulatory Visit: Payer: BLUE CROSS/BLUE SHIELD

## 2016-02-16 ENCOUNTER — Ambulatory Visit: Payer: BLUE CROSS/BLUE SHIELD

## 2016-02-19 ENCOUNTER — Other Ambulatory Visit: Payer: Self-pay | Admitting: Internal Medicine

## 2016-02-28 ENCOUNTER — Other Ambulatory Visit: Payer: Self-pay | Admitting: Radiation Oncology

## 2016-03-04 ENCOUNTER — Ambulatory Visit
Admission: RE | Admit: 2016-03-04 | Discharge: 2016-03-04 | Disposition: A | Discharge status: Home / Self Care | Payer: BLUE CROSS/BLUE SHIELD | Source: Ambulatory Visit | Attending: Radiation Oncology | Admitting: Radiation Oncology

## 2016-03-04 DIAGNOSIS — C7931 Secondary malignant neoplasm of brain: Secondary | ICD-10-CM

## 2016-03-04 DIAGNOSIS — C7949 Secondary malignant neoplasm of other parts of nervous system: Principal | ICD-10-CM

## 2016-03-04 MED ORDER — GADOBENATE DIMEGLUMINE 529 MG/ML IV SOLN
15.0000 mL | Freq: Once | INTRAVENOUS | Status: AC | PRN
  Administered 2016-03-04: 15 mL via INTRAVENOUS

## 2016-03-07 ENCOUNTER — Other Ambulatory Visit (HOSPITAL_BASED_OUTPATIENT_CLINIC_OR_DEPARTMENT_OTHER): Payer: BLUE CROSS/BLUE SHIELD

## 2016-03-07 ENCOUNTER — Encounter: Payer: Self-pay | Admitting: *Deleted

## 2016-03-07 ENCOUNTER — Ambulatory Visit (HOSPITAL_BASED_OUTPATIENT_CLINIC_OR_DEPARTMENT_OTHER): Payer: BLUE CROSS/BLUE SHIELD | Admitting: Internal Medicine

## 2016-03-07 ENCOUNTER — Ambulatory Visit (HOSPITAL_BASED_OUTPATIENT_CLINIC_OR_DEPARTMENT_OTHER): Payer: BLUE CROSS/BLUE SHIELD

## 2016-03-07 ENCOUNTER — Ambulatory Visit
Admission: RE | Admit: 2016-03-07 | Discharge: 2016-03-07 | Disposition: A | Discharge status: Home / Self Care | Payer: BLUE CROSS/BLUE SHIELD | Source: Ambulatory Visit | Attending: Radiation Oncology | Admitting: Radiation Oncology

## 2016-03-07 ENCOUNTER — Encounter: Payer: Self-pay | Admitting: Radiation Oncology

## 2016-03-07 ENCOUNTER — Telehealth: Payer: Self-pay | Admitting: Internal Medicine

## 2016-03-07 ENCOUNTER — Encounter: Payer: Self-pay | Admitting: Internal Medicine

## 2016-03-07 VITALS — BP 114/59 | HR 68 | Temp 97.8°F | Resp 18 | Ht 64.0 in | Wt 160.1 lb

## 2016-03-07 VITALS — BP 120/63 | HR 69 | Temp 97.7°F | Ht 64.0 in | Wt 160.0 lb

## 2016-03-07 DIAGNOSIS — C7931 Secondary malignant neoplasm of brain: Secondary | ICD-10-CM | POA: Diagnosis not present

## 2016-03-07 DIAGNOSIS — C7949 Secondary malignant neoplasm of other parts of nervous system: Secondary | ICD-10-CM

## 2016-03-07 DIAGNOSIS — Z836 Family history of other diseases of the respiratory system: Secondary | ICD-10-CM | POA: Insufficient documentation

## 2016-03-07 DIAGNOSIS — T66XXXA Radiation sickness, unspecified, initial encounter: Secondary | ICD-10-CM | POA: Diagnosis not present

## 2016-03-07 DIAGNOSIS — Z90710 Acquired absence of both cervix and uterus: Secondary | ICD-10-CM | POA: Diagnosis not present

## 2016-03-07 DIAGNOSIS — D6489 Other specified anemias: Secondary | ICD-10-CM | POA: Diagnosis not present

## 2016-03-07 DIAGNOSIS — Z8541 Personal history of malignant neoplasm of cervix uteri: Secondary | ICD-10-CM | POA: Insufficient documentation

## 2016-03-07 DIAGNOSIS — Z79899 Other long term (current) drug therapy: Secondary | ICD-10-CM | POA: Insufficient documentation

## 2016-03-07 DIAGNOSIS — Z809 Family history of malignant neoplasm, unspecified: Secondary | ICD-10-CM | POA: Diagnosis not present

## 2016-03-07 DIAGNOSIS — Z9221 Personal history of antineoplastic chemotherapy: Secondary | ICD-10-CM | POA: Diagnosis not present

## 2016-03-07 DIAGNOSIS — Z801 Family history of malignant neoplasm of trachea, bronchus and lung: Secondary | ICD-10-CM | POA: Diagnosis not present

## 2016-03-07 DIAGNOSIS — Z923 Personal history of irradiation: Secondary | ICD-10-CM | POA: Diagnosis not present

## 2016-03-07 DIAGNOSIS — C3411 Malignant neoplasm of upper lobe, right bronchus or lung: Secondary | ICD-10-CM

## 2016-03-07 DIAGNOSIS — Z5111 Encounter for antineoplastic chemotherapy: Secondary | ICD-10-CM

## 2016-03-07 DIAGNOSIS — F411 Generalized anxiety disorder: Secondary | ICD-10-CM

## 2016-03-07 DIAGNOSIS — Z87891 Personal history of nicotine dependence: Secondary | ICD-10-CM | POA: Diagnosis not present

## 2016-03-07 DIAGNOSIS — Z8249 Family history of ischemic heart disease and other diseases of the circulatory system: Secondary | ICD-10-CM | POA: Diagnosis not present

## 2016-03-07 DIAGNOSIS — F419 Anxiety disorder, unspecified: Secondary | ICD-10-CM | POA: Insufficient documentation

## 2016-03-07 DIAGNOSIS — X58XXXA Exposure to other specified factors, initial encounter: Secondary | ICD-10-CM | POA: Insufficient documentation

## 2016-03-07 DIAGNOSIS — Z885 Allergy status to narcotic agent status: Secondary | ICD-10-CM | POA: Diagnosis not present

## 2016-03-07 HISTORY — DX: Malignant neoplasm of upper lobe, right bronchus or lung: C34.11

## 2016-03-07 HISTORY — DX: Malignant neoplasm of cervix uteri, unspecified: C53.9

## 2016-03-07 LAB — CBC WITH DIFFERENTIAL/PLATELET
BASO%: 0.2 % (ref 0.0–2.0)
Basophils Absolute: 0 10*3/uL (ref 0.0–0.1)
EOS%: 0 % (ref 0.0–7.0)
Eosinophils Absolute: 0 10*3/uL (ref 0.0–0.5)
HCT: 36.3 % (ref 34.8–46.6)
HGB: 12 g/dL (ref 11.6–15.9)
LYMPH%: 4.2 % — ABNORMAL LOW (ref 14.0–49.7)
MCH: 33 pg (ref 25.1–34.0)
MCHC: 33.1 g/dL (ref 31.5–36.0)
MCV: 99.7 fL (ref 79.5–101.0)
MONO#: 0.5 10*3/uL (ref 0.1–0.9)
MONO%: 7.6 % (ref 0.0–14.0)
NEUT#: 5.9 10*3/uL (ref 1.5–6.5)
NEUT%: 88 % — ABNORMAL HIGH (ref 38.4–76.8)
Platelets: 246 10*3/uL (ref 145–400)
RBC: 3.64 10*6/uL — ABNORMAL LOW (ref 3.70–5.45)
RDW: 15.1 % — ABNORMAL HIGH (ref 11.2–14.5)
WBC: 6.7 10*3/uL (ref 3.9–10.3)
lymph#: 0.3 10*3/uL — ABNORMAL LOW (ref 0.9–3.3)

## 2016-03-07 LAB — COMPREHENSIVE METABOLIC PANEL
ALT: 12 U/L (ref 0–55)
AST: 11 U/L (ref 5–34)
Albumin: 3.6 g/dL (ref 3.5–5.0)
Alkaline Phosphatase: 90 U/L (ref 40–150)
Anion Gap: 11 mEq/L (ref 3–11)
BUN: 15.2 mg/dL (ref 7.0–26.0)
CO2: 24 mEq/L (ref 22–29)
Calcium: 9.4 mg/dL (ref 8.4–10.4)
Chloride: 106 mEq/L (ref 98–109)
Creatinine: 0.8 mg/dL (ref 0.6–1.1)
EGFR: 77 mL/min/{1.73_m2} — ABNORMAL LOW (ref >90)
Glucose: 132 mg/dl (ref 70–140)
Potassium: 3.8 mEq/L (ref 3.5–5.1)
Sodium: 141 mEq/L (ref 136–145)
Total Bilirubin: 0.4 mg/dL (ref 0.20–1.20)
Total Protein: 7 g/dL (ref 6.4–8.3)

## 2016-03-07 MED ORDER — SODIUM CHLORIDE 0.9 % IV SOLN
500.0000 mg/m2 | Freq: Once | INTRAVENOUS | Status: AC
  Administered 2016-03-07: 900 mg via INTRAVENOUS
  Filled 2016-03-07: qty 8

## 2016-03-07 MED ORDER — ALPRAZOLAM 0.25 MG PO TABS: 0.2500 mg | ORAL_TABLET | Freq: Three times a day (TID) | ORAL | Status: DC | PRN

## 2016-03-07 MED ORDER — SODIUM CHLORIDE 0.9 % IV SOLN
Freq: Once | INTRAVENOUS | Status: AC
  Administered 2016-03-07: 10:00:00 via INTRAVENOUS

## 2016-03-07 MED ORDER — SODIUM CHLORIDE 0.9 % IV SOLN
Freq: Once | INTRAVENOUS | Status: AC
  Administered 2016-03-07: 10:00:00 via INTRAVENOUS
  Filled 2016-03-07: qty 4

## 2016-03-07 NOTE — Progress Notes (Signed)
Oncology Nurse Navigator Documentation  Oncology Nurse Navigator Flowsheets 03/07/2016  Navigator Location [facility]  Navigator Encounter Type Clinic/[facility]  Treatment Phase Treatment  Barriers/Navigation Needs No barriers at this time  Acuity Level 1  Acuity Level 1 Minimal follow up required  Time Spent with Patient 15   I spoke with patient and her husband today.  She is doing well on treatment.  No barriers identified.

## 2016-03-07 NOTE — Telephone Encounter (Signed)
per pof gave pt contrast-adv central sch will call to sch scan

## 2016-03-07 NOTE — Patient Instructions (Signed)
[facility] Discharge Instructions for Patients Receiving Chemotherapy  Today you received the following chemotherapy agents Alimta  To help prevent nausea and vomiting after your treatment, we encourage you to take your nausea medication   If you develop nausea and vomiting that is not controlled by your nausea medication, call the clinic.   BELOW ARE SYMPTOMS THAT SHOULD BE REPORTED IMMEDIATELY:  *FEVER GREATER THAN 100.5 F  *CHILLS WITH OR WITHOUT FEVER  NAUSEA AND VOMITING THAT IS NOT CONTROLLED WITH YOUR NAUSEA MEDICATION  *UNUSUAL SHORTNESS OF BREATH  *UNUSUAL BRUISING OR BLEEDING  TENDERNESS IN MOUTH AND THROAT WITH OR WITHOUT PRESENCE OF ULCERS  *URINARY PROBLEMS  *BOWEL PROBLEMS  UNUSUAL RASH Items with * indicate a potential emergency and should be followed up as soon as possible.  Feel free to call the clinic you have any questions or concerns. The clinic phone number is [phone].  Please show the CHEMO ALERT CARD at check-in to the Emergency Department and triage nurse.   

## 2016-03-07 NOTE — Progress Notes (Signed)
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  PRIOR THERAPY:  1) Status post stereotactic radiotherapy to a solitary right parietal brain lesion under the care of Dr. [name] on 10/16/2013. 2) Status post palliative radiotherapy to the right lung tumor under the care of Dr. [name] completed on 12/05/2013. 3) Systemic chemotherapy with carboplatin for AUC of 5 and Alimta 500 mg/M2 every 3 weeks. First dose Jan 06 2014. Status post 6 cycles.  CURRENT THERAPY: Systemic chemotherapy with maintenance Alimta 500 mg/M2 every 3 weeks. Status post 30 cycles.  INTERVAL HISTORY: Alexis [name] 65 y.o. female returns to the clinic today for 3 weeks followup visit accompanied by her husband. The patient is feeling fine today and denied having any specific complaints except for a feeling of swelling of her left arm. She is tolerating her maintenance chemotherapy with Alimta fairly well. She denied having any significant chest pain, shortness of breath, cough or hemoptysis. She has no fever or chills, no nausea or vomiting. She denied having any significant weight loss or night sweats. She is here today to start cycle #31. She had a recent MRI of the brain that showed no clear evidence for disease progression.  MEDICAL HISTORY: Past Medical History  Diagnosis Date  . Ileus, postoperative 11/18/2013  . Physical deconditioning 11/18/2013  . Severe protein-calorie malnutrition ([facility]) 11/18/2013  . Anemia  11/18/2013  . History of radiation therapy 10-28-13- 12-05-13    lung ca 50 Gy/25f  . Anxiety   . Hx of radiation therapy 10/16/13    rt parietal brain/20Gy  . Encounter for antineoplastic chemotherapy 07/20/2015  . 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  MEDICATIONS:  Current Outpatient Prescriptions  Medication Sig Dispense Refill  . acetaminophen (TYLENOL) 500 MG tablet Take 500 mg by mouth every 6 (six) hours as needed for mild pain or headache. Reported on 11/02/2015    . ALPRAZolam (XANAX) 0.25 MG tablet Take 1 tablet (0.25 mg total) by mouth 3 (three) times daily as needed. for anxiety 45 tablet 0  . Ascorbic Acid (VITAMIN C GUMMIE PO) Take 1 each by mouth every morning.    .Marland Kitchendexamethasone (DECADRON) 4 MG tablet 4 mg by mouth twice a day the day before, day of and day after the chemotherapy every 3 weeks 40 tablet 1  . folic acid (FOLVITE) 1 MG tablet TAKE 1 TABLET (1 MG TOTAL) BY MOUTH DAILY. 30 tablet 0  . Multiple Vitamin (MULTIVITAMIN WITH MINERALS) TABS tablet Take 1 tablet by mouth every morning.    .Marland Kitchenomeprazole (PRILOSEC) 20 MG capsule Take 1 capsule (20 mg total) by mouth daily. As needed for reflux or indigestion. 42 capsule PRN  . ondansetron (ZOFRAN) 8 MG tablet Take 1 tablet (8 mg total) by mouth every 8 (eight) hours as needed for nausea or vomiting. 30 tablet 0  . OVER THE COUNTER MEDICATION  Take 1 tablet by mouth every morning. (Vitamin A)    . pentoxifylline (TRENTAL) 400 MG CR tablet TAKE ONE TABLET BY MOUTH TWICE DAILY, ALSO  TAKE  VITAMIN  E  400  UNITS  TWICE  DAILY  WITH  PENTOXIFYLLINE 60 tablet 0  . pentoxifylline (TRENTAL) 400 MG CR tablet Take 1 tablet (400 mg total) by mouth 2 (two) times daily. Patient is also to take vitamin E 400 mg BID with trental. 60 tablet 3  . pentoxifylline (TRENTAL) 400 MG CR tablet TAKE 1 TABLET BY MOUTH TWICE A DAY (TAKE WITH VITAMIN E '400MG'$  TWICE  A DAY) 60 tablet 3  . prochlorperazine (COMPAZINE) 10 MG tablet Take 1 tablet (10 mg total) by mouth every 6 (six) hours as needed for nausea or vomiting. 60 tablet 0  . senna-docusate (SENOKOT-S) 8.6-50 MG tablet Take 1 tablet by mouth daily. 30 tablet prn  . Zinc 50 MG TABS Take 1 tablet by mouth every morning.     No current facility-administered medications for this visit.    SURGICAL HISTORY:  Past Surgical History  Procedure Laterality Date  . Video bronchoscopy Bilateral 08/30/2013    Procedure: VIDEO BRONCHOSCOPY WITH FLUORO;  Surgeon: Tanda Rockers, MD;  Location: [facility];  Service: Cardiopulmonary;  Laterality: Bilateral;  . Abdominal hysterectomy    . Laparotomy N/A 11/03/2013    Procedure: EXPLORATORY LAPAROTOMY, DRAINAGE OF INTRA  ABDOMINAL ABSCESSES, MOBILIZATION OF SPLENIC FLEXURE, SIGMOID COLECTOMY WITH COLOSTOMY;  Surgeon: Odis Hollingshead, MD;  Location: [facility];  Service: General;  Laterality: N/A;  . Colostomy takedown N/A 07/10/2014    Procedure: LAPAROSCOPIC LYSIS OF ADHESIONS (90 MIN) LAPAROSCOPIC ASSISTED COLOSTOMY CLOSURE, RIGID PROCTOSIGMOIDOSCOPY;  Surgeon: Jackolyn Confer, MD;  Location: [facility];  Service: General;  Laterality: N/A;    REVIEW OF SYSTEMS:  A comprehensive review of systems was negative.   PHYSICAL EXAMINATION: General appearance: alert, cooperative, fatigued and no distress Head: Normocephalic, without obvious abnormality, atraumatic Neck: no adenopathy, no JVD, supple, symmetrical, trachea midline and thyroid not enlarged, symmetric, no tenderness/mass/nodules Lymph nodes: Cervical, supraclavicular, and axillary nodes normal. Resp: clear to auscultation bilaterally Back: symmetric, no curvature. ROM normal. No CVA tenderness. Cardio: regular rate and rhythm, S1, S2 normal, no murmur, click, rub or gallop GI: soft, non-tender; bowel sounds normal; no masses,  no organomegaly Extremities: extremities normal, atraumatic, no cyanosis or  edema Neurologic: Alert and oriented X 3, normal strength and tone. Normal symmetric reflexes. Normal coordination and gait  ECOG PERFORMANCE STATUS: 0 - Asymptomatic  Blood pressure 114/59, pulse 68, temperature 97.8 F (36.6 C), temperature source Oral, resp. rate 18, height '5\' 4"'$  (1.626 m), weight 160 lb 1.6 oz (72.621 kg), SpO2 100 %.  LABORATORY DATA: Lab Results  Component Value Date   WBC 6.7 03/07/2016   HGB 12.0 03/07/2016   HCT 36.3 03/07/2016   MCV 99.7 03/07/2016   PLT 246 03/07/2016      Chemistry      Component Value Date/Time   NA 141 02/15/2016 0751   NA 139 07/12/2014 0512   K 3.7 02/15/2016 0751   K 3.9 07/12/2014 0512   CL 100 07/12/2014 0512   CO2 23 02/15/2016 0751   CO2 32 07/12/2014 0512   BUN 15.3 02/15/2016 0751   BUN 5* 07/12/2014 0512   CREATININE 0.8 02/15/2016 0751   CREATININE 0.55 07/12/2014 0512      Component Value Date/Time   CALCIUM 9.4 02/15/2016 0751   CALCIUM 9.0 07/12/2014 0512  ALKPHOS 89 02/15/2016 0751   ALKPHOS 91 07/10/2014 0955   AST 10 02/15/2016 0751   AST 15 07/10/2014 0955   ALT 15 02/15/2016 0751   ALT 8 07/10/2014 0955   BILITOT 0.43 02/15/2016 0751   BILITOT 0.4 07/10/2014 0955       RADIOGRAPHIC STUDIES: Mr [name] Wo Contrast  18-Mar-2016  CLINICAL DATA:  Metastatic lung cancer status post stereotactic radiosurgery. EXAM: MRI HEAD WITHOUT AND WITH CONTRAST TECHNIQUE: Multiplanar, multiecho pulse sequences of the brain and surrounding structures were obtained without and with intravenous contrast. CONTRAST:  40m MULTIHANCE GADOBENATE DIMEGLUMINE 529 MG/ML IV SOLN COMPARISON:  Brain MRI 10/30/2015 FINDINGS: Brain Parenchyma: T2 hyperintensity centered in the region of the right central sulcus has increased in extent from the prior examination. Otherwise, scattered multifocal white matter hyperintensity is unchanged. Gradient echo imaging shows mild hemosiderin deposition near the right central sulcus, unchanged.  Midline structures are unremarkable. Peripherally contrast enhancing lesion centered within the right parietal lobe is unchanged in size measuring 16 x 9 mm. Right frontal developmental venous anomaly is unchanged. No acute infarct or acute intraparenchymal hemorrhage. Multiple prominent perivascular spaces and old lacunar infarcts are again seen. Ventricles, Sulci and Extra-axial Spaces: Normal for age. No extra-axial collection. Paranasal Sinuses and Mastoids: No fluid levels or advanced mucosal thickening. Orbits: Normal. Bones and Soft Tissues: Nonenhancing T1/T2 hyperintense structure within the left parietal scalp is unchanged. No focal osseous lesion is identified. IMPRESSION: 1. Unchanged size of peripherally contrast-enhancing lesion within the right parietal lobe with slight increase in surrounding hyperintense T2 weighted signal. Again, the findings may indicate posttreatment effect. 2. No new intraparenchymal metastases. Electronically Signed   By: KUlyses JarredM.D.   On: [id number]    ASSESSMENT AND PLAN: This is a very pleasant 65years old white female recently diagnosed with a stage IV non-small cell lung cancer, adenocarcinoma with negative EGFR mutation and negative ALK gene translocation. The patient is status post stereotactic radiotherapy to a solitary brain metastases in addition to palliative radiotherapy to the right upper lobe lung mass. She status post 6 cycles of systemic chemotherapy with carboplatin and Alimta with improvement in her disease and she is currently undergoing maintenance chemotherapy with single agent Alimta status post 30 cycles and tolerating her treatment fairly well. The recent MRI of the brain showed no evidence for disease progression. I discussed the results with the patient today. I recommended for her to continue her current maintenance treatment with single agent Alimta 500 mg/M2 every 3 weeks. She will proceed with cycle #31 today. The patient  would come back for follow-up visit in 3 weeks for evaluation before starting cycle #32 after repeating CT scan of the chest, abdomen and pelvis for restaging of her disease. The patient was advised to call immediately if she has any concerning symptoms in the interval. The patient voices understanding of current disease status and treatment options and is in agreement with the current care plan.  All questions were answered. The patient knows to call the clinic with any problems, questions or concerns. We can certainly see the patient much sooner if necessary.  Disclaimer: This note was dictated with voice recognition software. Similar sounding words can inadvertently be transcribed and may not be corrected upon review.

## 2016-03-07 NOTE — Progress Notes (Signed)
Alexis [name] reports that she is not experiencing any headaches, blurred/double vision, ataxia, and no changes in fine motor movement. Accompanied by spouse.   Alimta infusion today and will end in Septmeber 2017.

## 2016-03-08 ENCOUNTER — Other Ambulatory Visit: Payer: BLUE CROSS/BLUE SHIELD

## 2016-03-08 ENCOUNTER — Encounter: Payer: Self-pay | Admitting: Radiation Oncology

## 2016-03-08 ENCOUNTER — Ambulatory Visit: Payer: BLUE CROSS/BLUE SHIELD

## 2016-03-08 NOTE — Progress Notes (Addendum)
Radiation Oncology         [phone] ________________________________  Name: Alexis [name] MRN: [id number]  Date: 03/07/2016  DOB: 10/07/1950  Follow-Up Visit Note  CC: [name], MD  [name], *  Diagnosis:  Stage IV (T2a, N0, M1b) non-small cell lung cancer of the right upper lobe consistent with adenocarcinoma with brain metastasis at presentation.   Interval Since Last Radiation:  27 months  10/28/2013 through 12/05/2013: The patient was treated to the right lung tumor to a dose of 50 gray in 25 fractions using a 3-D conformal technique. Daily image guidance was used for the patient's treatment.  10/16/2013: Rt Parietal 82m target was treated using 3 Arcs to a prescription dose of 20 Gy. ExacTrac Snap verification was performed for each couch angle.  Narrative:  The patient returns today for routine follow-up. The patient continues to do well with her current regimen of Alimta under the care of Dr. 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 Her MRI of the brain on 03/04/16 did not show any new edema, or new brain lesions.   On review of systems, the patient reports that she is doing well overall. She denies any headaches, blurred vision, diplopia, auditory changes, seizure activity, or loss of balance. She denies any chest pain, shortness of breath, cough, fevers, chills, night sweats, unintended weight changes. She denies any bowel or bladder disturbances, and denies abdominal pain, nausea or vomiting. She denies any new musculoskeletal or joint aches or pains. A complete review of systems is obtained and is otherwise negative.   Past Medical History:  Past Medical History  Diagnosis Date  . Anemia 11/18/2013  . Anxiety   . Encounter for antineoplastic chemotherapy 07/20/2015  . Malignant neoplasm of right  upper lobe of lung (HCC)      non small cell lung cancer adenocarcioma with brain meta  . Cervical cancer ([facility]     Past Surgical History: Past Surgical History  Procedure Laterality Date  . Video bronchoscopy Bilateral 08/30/2013    Procedure: VIDEO BRONCHOSCOPY WITH FLUORO;  Surgeon: MTanda Rockers MD;  Location: [facility];  Service: Cardiopulmonary;  Laterality: Bilateral;  . Abdominal hysterectomy    . Laparotomy N/A 11/03/2013    Procedure: EXPLORATORY LAPAROTOMY, DRAINAGE OF INTRA  ABDOMINAL ABSCESSES, MOBILIZATION OF SPLENIC FLEXURE, SIGMOID COLECTOMY WITH COLOSTOMY;  Surgeon: TOdis Hollingshead MD;  Location: [facility];  Service: General;  Laterality: N/A;  . Colostomy takedown N/A 07/10/2014    Procedure: LAPAROSCOPIC LYSIS OF ADHESIONS (90 MIN) LAPAROSCOPIC ASSISTED COLOSTOMY CLOSURE, RIGID PROCTOSIGMOIDOSCOPY;  Surgeon: TJackolyn Confer MD;  Location: [facility];  Service: General;  Laterality: N/A;    Social History:  Social History   Social History  . Marital Status: Married    Spouse Name: N/A  . Number of Children: N/A  . Years of Education: N/A   Occupational History  . 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 Smoking status: Former Smoker -- 1.00 packs/day for 40 years    Types: Cigarettes    Quit date: 09/27/2013  . Smokeless tobacco: Never Used  . Alcohol Use: No  . Drug Use: No  . Sexual Activity: Not on file   Other Topics Concern  . Not on file   Social History Narrative    Family History: Family History  Problem Relation Age of Onset  . Emphysema Father     smoked  . Lung cancer Father     smoked  . Cancer Mother   . Hypertension Mother   . COPD Mother     ALLERGIES:  is allergic to codeine.  Meds: Current Outpatient Prescriptions  Medication Sig Dispense Refill  . acetaminophen (TYLENOL) 500 MG tablet Take 500 mg by mouth every 6 (six) hours as needed for mild pain or headache. Reported on 11/02/2015    .  ALPRAZolam (XANAX) 0.25 MG tablet Take 1 tablet (0.25 mg total) by mouth 3 (three) times daily as needed. for anxiety 45 tablet 0  . Ascorbic Acid (VITAMIN C GUMMIE PO) Take 1 each by mouth every morning.    Marland Kitchen dexamethasone (DECADRON) 4 MG tablet 4 mg by mouth twice a day the day before, day of and day after the chemotherapy every 3 weeks 40 tablet 1  . folic acid (FOLVITE) 1 MG tablet TAKE 1 TABLET (1 MG TOTAL) BY MOUTH DAILY. 30 tablet 0  . Multiple Vitamin (MULTIVITAMIN WITH MINERALS) TABS tablet Take 1 tablet by mouth every morning.    Marland Kitchen omeprazole (PRILOSEC) 20 MG capsule Take 1 capsule (20 mg total) by mouth daily. As needed for reflux or indigestion. 42 capsule PRN  . OVER THE COUNTER MEDICATION Take 1 tablet by mouth every morning. (Vitamin A)    . pentoxifylline (TRENTAL) 400 MG CR tablet TAKE ONE TABLET BY MOUTH TWICE DAILY, ALSO  TAKE  VITAMIN  E  400  UNITS  TWICE  DAILY  WITH  PENTOXIFYLLINE 60 tablet 0  . senna-docusate (SENOKOT-S) 8.6-50 MG tablet Take 1 tablet by mouth daily. 30 tablet prn  . Zinc 50 MG TABS Take 1 tablet by mouth every morning.    . ondansetron (ZOFRAN) 8 MG tablet Take 1 tablet (8 mg total) by mouth every 8 (eight) hours as needed for nausea or vomiting. (Patient not taking: Reported on 03/07/2016) 30 tablet 0  . prochlorperazine (COMPAZINE) 10 MG tablet Take 1 tablet (10 mg total) by mouth every 6 (six) hours as needed for nausea or vomiting. (Patient not taking: Reported on 03/07/2016) 60 tablet 0   No current facility-administered medications for this encounter.    Physical Findings:  height is '5\' 4"'$  (1.626 m) and weight is 160 lb (72.576 kg). Her temperature is 97.7 F (36.5 C). Her blood pressure is 120/63 and her pulse is 69.  In general this is a well appearing Caucasian female in no acute distress. She's alert and oriented x4 and appropriate throughout the examination. She is normocephalic, atraumatic. EOMS are intact. Cardiopulmonary assessment is negative  for acute distress and she exhibits normal effort.  Lab Findings: Lab Results  Component Value Date   WBC 6.7 03/07/2016   HGB 12.0 03/07/2016   HCT 36.3 03/07/2016   MCV 99.7 03/07/2016   PLT 246 03/07/2016     Radiographic Findings: Mr [name] YK Contrast  03/04/2016  CLINICAL DATA:  Metastatic lung cancer status post stereotactic radiosurgery. EXAM: MRI HEAD WITHOUT AND WITH CONTRAST TECHNIQUE: Multiplanar, multiecho pulse sequences of the brain and surrounding structures were obtained without and with intravenous contrast. CONTRAST:  47m MULTIHANCE GADOBENATE DIMEGLUMINE 529 MG/ML IV SOLN COMPARISON:  Brain MRI 10/30/2015 FINDINGS: Brain Parenchyma: T2 hyperintensity centered in the region of the right central sulcus has increased in extent from the prior examination. Otherwise, scattered multifocal white matter hyperintensity is unchanged. Gradient echo imaging shows mild hemosiderin deposition  near the right central sulcus, unchanged. Midline structures are unremarkable. Peripherally contrast enhancing lesion centered within the right parietal lobe is unchanged in size measuring 16 x 9 mm. Right frontal developmental venous anomaly is unchanged. No acute infarct or acute intraparenchymal hemorrhage. Multiple prominent perivascular spaces and old lacunar infarcts are again seen. Ventricles, Sulci and Extra-axial Spaces: Normal for age. No extra-axial collection. Paranasal Sinuses and Mastoids: No fluid levels or advanced mucosal thickening. Orbits: Normal. Bones and Soft Tissues: Nonenhancing T1/T2 hyperintense structure within the left parietal scalp is unchanged. No focal osseous lesion is identified. IMPRESSION: 1. Unchanged size of peripherally contrast-enhancing lesion within the right parietal lobe with slight increase in surrounding hyperintense T2 weighted signal. Again, the findings may indicate posttreatment effect. 2. No new intraparenchymal metastases. Electronically Signed   By:  Ulyses Jarred M.D.   On: 03/04/2016 09:52    Impression/Plan: 1. 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 The patient appears to be clinically doing quite well. Radiographically she has stable change on MRI. We will plan to follow up with her in 4 months time after she has her next T3 MRI and this has been reviewed at brain conference. She states agreement and understanding. She will otherwise continue her systemic therapy with Alimta under the care of Dr. Julien Nordmann. 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 We would recommend that she continue this, and we will provide additional prescriptions as needed.    Carola Rhine, PAC

## 2016-03-25 ENCOUNTER — Ambulatory Visit (HOSPITAL_COMMUNITY)
Admission: RE | Admit: 2016-03-25 | Discharge: 2016-03-25 | Disposition: A | Discharge status: Home / Self Care | Payer: BLUE CROSS/BLUE SHIELD | Source: Ambulatory Visit | Attending: Internal Medicine | Admitting: Internal Medicine

## 2016-03-25 DIAGNOSIS — F411 Generalized anxiety disorder: Secondary | ICD-10-CM | POA: Diagnosis not present

## 2016-03-25 DIAGNOSIS — C7949 Secondary malignant neoplasm of other parts of nervous system: Secondary | ICD-10-CM | POA: Insufficient documentation

## 2016-03-25 DIAGNOSIS — C3411 Malignant neoplasm of upper lobe, right bronchus or lung: Secondary | ICD-10-CM

## 2016-03-25 DIAGNOSIS — C7931 Secondary malignant neoplasm of brain: Secondary | ICD-10-CM | POA: Insufficient documentation

## 2016-03-25 DIAGNOSIS — J439 Emphysema, unspecified: Secondary | ICD-10-CM | POA: Diagnosis not present

## 2016-03-25 MED ORDER — IOPAMIDOL (ISOVUE-300) INJECTION 61%: 100.0000 mL | Freq: Once | INTRAVENOUS | Status: DC | PRN

## 2016-03-28 ENCOUNTER — Ambulatory Visit (HOSPITAL_BASED_OUTPATIENT_CLINIC_OR_DEPARTMENT_OTHER): Payer: BLUE CROSS/BLUE SHIELD | Admitting: Internal Medicine

## 2016-03-28 ENCOUNTER — Telehealth: Payer: Self-pay | Admitting: Internal Medicine

## 2016-03-28 ENCOUNTER — Encounter: Payer: Self-pay | Admitting: Internal Medicine

## 2016-03-28 ENCOUNTER — Other Ambulatory Visit (HOSPITAL_BASED_OUTPATIENT_CLINIC_OR_DEPARTMENT_OTHER): Payer: BLUE CROSS/BLUE SHIELD

## 2016-03-28 ENCOUNTER — Other Ambulatory Visit: Payer: Self-pay | Admitting: Internal Medicine

## 2016-03-28 ENCOUNTER — Ambulatory Visit (HOSPITAL_BASED_OUTPATIENT_CLINIC_OR_DEPARTMENT_OTHER): Payer: BLUE CROSS/BLUE SHIELD

## 2016-03-28 VITALS — BP 128/64 | HR 82 | Temp 98.0°F | Resp 17 | Ht 64.0 in | Wt 161.9 lb

## 2016-03-28 DIAGNOSIS — C3411 Malignant neoplasm of upper lobe, right bronchus or lung: Secondary | ICD-10-CM

## 2016-03-28 DIAGNOSIS — Z5111 Encounter for antineoplastic chemotherapy: Secondary | ICD-10-CM

## 2016-03-28 DIAGNOSIS — C7801 Secondary malignant neoplasm of right lung: Secondary | ICD-10-CM

## 2016-03-28 DIAGNOSIS — C7949 Secondary malignant neoplasm of other parts of nervous system: Secondary | ICD-10-CM

## 2016-03-28 DIAGNOSIS — C7931 Secondary malignant neoplasm of brain: Secondary | ICD-10-CM

## 2016-03-28 LAB — CBC WITH DIFFERENTIAL/PLATELET
BASO%: 0.1 % (ref 0.0–2.0)
Basophils Absolute: 0 10*3/uL (ref 0.0–0.1)
EOS%: 0 % (ref 0.0–7.0)
Eosinophils Absolute: 0 10*3/uL (ref 0.0–0.5)
HCT: 37.5 % (ref 34.8–46.6)
HGB: 12.4 g/dL (ref 11.6–15.9)
LYMPH%: 3.9 % — ABNORMAL LOW (ref 14.0–49.7)
MCH: 32.7 pg (ref 25.1–34.0)
MCHC: 33 g/dL (ref 31.5–36.0)
MCV: 99.1 fL (ref 79.5–101.0)
MONO#: 0.7 10*3/uL (ref 0.1–0.9)
MONO%: 8.4 % (ref 0.0–14.0)
NEUT#: 7.1 10*3/uL — ABNORMAL HIGH (ref 1.5–6.5)
NEUT%: 87.6 % — ABNORMAL HIGH (ref 38.4–76.8)
Platelets: 274 10*3/uL (ref 145–400)
RBC: 3.79 10*6/uL (ref 3.70–5.45)
RDW: 14.8 % — ABNORMAL HIGH (ref 11.2–14.5)
WBC: 8.1 10*3/uL (ref 3.9–10.3)
lymph#: 0.3 10*3/uL — ABNORMAL LOW (ref 0.9–3.3)

## 2016-03-28 LAB — COMPREHENSIVE METABOLIC PANEL
ALT: 23 U/L (ref 0–55)
AST: 14 U/L (ref 5–34)
Albumin: 3.6 g/dL (ref 3.5–5.0)
Alkaline Phosphatase: 99 U/L (ref 40–150)
Anion Gap: 11 mEq/L (ref 3–11)
BUN: 12.2 mg/dL (ref 7.0–26.0)
CO2: 24 mEq/L (ref 22–29)
Calcium: 9.6 mg/dL (ref 8.4–10.4)
Chloride: 106 mEq/L (ref 98–109)
Creatinine: 0.8 mg/dL (ref 0.6–1.1)
EGFR: 78 mL/min/{1.73_m2} — ABNORMAL LOW (ref >90)
Glucose: 128 mg/dl (ref 70–140)
Potassium: 4.1 mEq/L (ref 3.5–5.1)
Sodium: 141 mEq/L (ref 136–145)
Total Bilirubin: 0.4 mg/dL (ref 0.20–1.20)
Total Protein: 7.2 g/dL (ref 6.4–8.3)

## 2016-03-28 MED ORDER — SODIUM CHLORIDE 0.9 % IV SOLN
500.0000 mg/m2 | Freq: Once | INTRAVENOUS | Status: AC
  Administered 2016-03-28: 900 mg via INTRAVENOUS
  Filled 2016-03-28: qty 20

## 2016-03-28 MED ORDER — SODIUM CHLORIDE 0.9 % IV SOLN
Freq: Once | INTRAVENOUS | Status: AC
  Administered 2016-03-28: 11:00:00 via INTRAVENOUS
  Filled 2016-03-28: qty 4

## 2016-03-28 MED ORDER — SODIUM CHLORIDE 0.9 % IV SOLN
Freq: Once | INTRAVENOUS | Status: AC
  Administered 2016-03-28: 11:00:00 via INTRAVENOUS

## 2016-03-28 NOTE — Telephone Encounter (Signed)
per pof to sch pt appt-appts already sch-gave avs/cal

## 2016-03-28 NOTE — Progress Notes (Signed)
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  PRIOR THERAPY:  1) Status post stereotactic radiotherapy to a solitary right parietal brain lesion under the care of Dr. [name] on 10/16/2013. 2) Status post palliative radiotherapy to the right lung tumor under the care of Dr. [name] completed on 12/05/2013. 3) Systemic chemotherapy with carboplatin for AUC of 5 and Alimta 500 mg/M2 every 3 weeks. First dose Jan 06 2014. Status post 6 cycles.  CURRENT THERAPY: Systemic chemotherapy with maintenance Alimta 500 mg/M2 every 3 weeks. Status post 31 cycles.  INTERVAL HISTORY: Alexis [name] 65 y.o. female returns to the clinic today for 3 weeks followup visit accompanied by her husband. The patient is feeling fine today and denied having any specific complaints. She is tolerating her maintenance chemotherapy with Alimta fairly well. She status post 31 cycles. She denied having any significant chest pain, shortness of breath, cough or hemoptysis. She has no fever or chills, no nausea or vomiting. She denied having any significant weight loss or night sweats. She had repeat CT scan of the chest, abdomen and pelvis performed recently and she is here for evaluation and discussion of her scan results.  MEDICAL HISTORY: Past Medical History:  Diagnosis Date  . Anxiety   . Cervical cancer ([place])   . Encounter for antineoplastic chemotherapy 07/20/2015  . 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  MEDICATIONS:  Current Outpatient Prescriptions  Medication Sig Dispense Refill  . acetaminophen (TYLENOL) 500 MG tablet Take 500 mg by mouth every 6 (six) hours as needed for mild pain or headache. Reported on 11/02/2015    . ALPRAZolam (XANAX) 0.25 MG tablet Take 1 tablet (0.25 mg total) by mouth 3 (three) times daily as needed. for anxiety 45 tablet 0  . Ascorbic Acid (VITAMIN C GUMMIE PO) Take 1 each by mouth every morning.    Marland Kitchen dexamethasone (DECADRON) 4 MG tablet 4 mg by mouth twice a day the day before, day of and day after the chemotherapy every 3 weeks 40 tablet 1  . folic acid (FOLVITE) 1 MG tablet TAKE 1 TABLET (1 MG TOTAL) BY MOUTH DAILY. 30 tablet 0  . Multiple Vitamin (MULTIVITAMIN WITH MINERALS) TABS tablet Take 1 tablet by mouth every morning.    Marland Kitchen omeprazole (PRILOSEC) 20 MG capsule Take 1 capsule (20 mg total) by mouth daily. As needed for reflux or indigestion. 42 capsule PRN  . OVER THE COUNTER MEDICATION Take 1 tablet by mouth every morning. (Vitamin A)    . pentoxifylline (TRENTAL) 400 MG CR tablet TAKE ONE TABLET BY MOUTH TWICE DAILY, ALSO  TAKE  VITAMIN  E  400  UNITS  TWICE  DAILY  WITH  PENTOXIFYLLINE 60 tablet 0  . senna-docusate (SENOKOT-S) 8.6-50 MG tablet Take 1 tablet by mouth daily. 30 tablet prn  . Zinc 50 MG TABS Take  1 tablet by mouth every morning.    . ondansetron (ZOFRAN) 8 MG tablet Take 1 tablet (8 mg total) by mouth every 8 (eight) hours as needed for nausea or vomiting. (Patient not taking: Reported on 03/07/2016) 30 tablet 0  . prochlorperazine (COMPAZINE) 10 MG tablet Take 1 tablet (10 mg total) by mouth every 6 (six) hours as needed for nausea or vomiting. (Patient not taking: Reported on 03/07/2016) 60 tablet 0   No current facility-administered medications for this visit.     SURGICAL HISTORY:  Past Surgical History:  Procedure Laterality  Date  . ABDOMINAL HYSTERECTOMY    . COLOSTOMY TAKEDOWN N/A 07/10/2014   Procedure: LAPAROSCOPIC LYSIS OF ADHESIONS (90 MIN) LAPAROSCOPIC ASSISTED COLOSTOMY CLOSURE, RIGID PROCTOSIGMOIDOSCOPY;  Surgeon: Jackolyn Confer, MD;  Location: [facility];  Service: General;  Laterality: N/A;  . LAPAROTOMY N/A 11/03/2013   Procedure: EXPLORATORY LAPAROTOMY, DRAINAGE OF INTRA  ABDOMINAL ABSCESSES, MOBILIZATION OF SPLENIC FLEXURE, SIGMOID COLECTOMY WITH COLOSTOMY;  Surgeon: Odis Hollingshead, MD;  Location: [facility];  Service: General;  Laterality: N/A;  . VIDEO BRONCHOSCOPY Bilateral 08/30/2013   Procedure: VIDEO BRONCHOSCOPY WITH FLUORO;  Surgeon: Tanda Rockers, MD;  Location: [facility];  Service: Cardiopulmonary;  Laterality: Bilateral;    REVIEW OF SYSTEMS:  Constitutional: negative Eyes: negative Ears, nose, mouth, throat, and face: negative Respiratory: negative Cardiovascular: negative Gastrointestinal: negative Genitourinary:negative Integument/breast: negative Hematologic/lymphatic: negative Musculoskeletal:negative Neurological: negative Behavioral/Psych: negative Endocrine: negative Allergic/Immunologic: negative   PHYSICAL EXAMINATION: General appearance: alert, cooperative, fatigued and no distress Head: Normocephalic, without obvious abnormality, atraumatic Neck: no adenopathy, no JVD, supple, symmetrical, trachea midline and thyroid not enlarged, symmetric, no tenderness/mass/nodules Lymph nodes: Cervical, supraclavicular, and axillary nodes normal. Resp: clear to auscultation bilaterally Back: symmetric, no curvature. ROM normal. No CVA tenderness. Cardio: regular rate and rhythm, S1, S2 normal, no murmur, click, rub or gallop GI: soft, non-tender; bowel sounds normal; no masses,  no organomegaly Extremities: extremities normal, atraumatic, no cyanosis or edema Neurologic: Alert and oriented X 3, normal strength and tone. Normal symmetric reflexes. Normal coordination and gait  ECOG  PERFORMANCE STATUS: 0 - Asymptomatic  Blood pressure 128/64, pulse 82, temperature 98 F (36.7 C), temperature source Oral, resp. rate 17, height 5' 4"  (1.626 m), weight 161 lb 14.4 oz (73.4 kg), SpO2 100 %.  LABORATORY DATA: Lab Results  Component Value Date   WBC 8.1 03/28/2016   HGB 12.4 03/28/2016   HCT 37.5 03/28/2016   MCV 99.1 03/28/2016   PLT 274 03/28/2016      Chemistry      Component Value Date/Time   NA 141 03/28/2016 0822   K 4.1 03/28/2016 0822   CL 100 07/12/2014 0512   CO2 24 03/28/2016 0822   BUN 12.2 03/28/2016 0822   CREATININE 0.8 03/28/2016 0822      Component Value Date/Time   CALCIUM 9.6 03/28/2016 0822   ALKPHOS 99 03/28/2016 0822   AST 14 03/28/2016 0822   ALT 23 03/28/2016 0822   BILITOT 0.40 03/28/2016 0822       RADIOGRAPHIC STUDIES: Ct Chest W Contrast  Result Date: 03/25/2016 CLINICAL DATA:  Restaging lung cancer. History of radiation and chemotherapy. EXAM: CT CHEST AND ABDOMEN WITH CONTRAST TECHNIQUE: Multidetector CT imaging of the chest and abdomen was performed following the standard protocol during bolus administration of intravenous contrast. CONTRAST:  100 cc Isovue-300 COMPARISON:  01/22/2016. FINDINGS: CT CHEST FINDINGS Cardiovascular: The heart is normal in size. No pericardial effusion. Stable aortic atherosclerotic calcifications but no  aneurysm or dissection. The pulmonary arteries are grossly normal. Mediastinum: Small scattered mediastinal and hilar lymph nodes are stable. No mass or adenopathy. 7 mm right suprahilar lymph node on image 25 previously measured 9.5 mm. The esophagus is grossly normal. Lungs/pleura: Stable right apical lung mass measuring 24 x 14 mm. Surrounding radiation changes are again demonstrated. No new/acute pulmonary findings. No worrisome pulmonary nodules to suggest pulmonary metastatic disease. No pleural effusions. Chest wall: No breast masses, supraclavicular or axillary lymphadenopathy. The thyroid gland  appears stable. Tiny nodules are stable. CT ABDOMEN FINDINGS Hepatobiliary: No focal hepatic lesions or intrahepatic biliary dilatation. The gallbladder is normal. No common bile duct dilatation. Pancreas:  Normal. Spleen:  Normal Adrenal/ urinary tract: The adrenal glands and kidneys are unremarkable. The ureters and bladder are unremarkable. Stomach/GI tract: The stomach, duodenum, small bowel and colon are grossly normal. No inflammatory changes, mass lesions or obstructive findings. The terminal ileum is normal. The appendix is normal. Surgical changes involving the rectosigmoid junction. No complicating features. Vascular/lymphatic: The aorta and branch vessels are patent. Moderate stable atherosclerotic calcifications. Small scattered mesenteric and retroperitoneal lymph nodes but no mass or overt adenopathy. Pelvis: The uterus is surgically absent. Both ovaries are still present and appear normal. No pelvic mass or adenopathy. No free pelvic fluid collections. Musculoskeletal: No significant bony findings. Musculoskeletal:  No significant findings. IMPRESSION: 1. Stable 24 x 14 mm right upper lobe pulmonary lesion with surrounding radiation changes. 2. Stable emphysematous changes and pulmonary scarring. 3. No mediastinal or hilar mass or adenopathy. Stable to slightly smaller nodes. 4. No findings for metastatic disease involving the abdomen/pelvis. Electronically Signed   By: Marijo Sanes M.D.   On: 03/25/2016 14:33  Mr [name] ZO Contrast  Result Date: 03/04/2016 CLINICAL DATA:  Metastatic lung cancer status post stereotactic radiosurgery. EXAM: MRI HEAD WITHOUT AND WITH CONTRAST TECHNIQUE: Multiplanar, multiecho pulse sequences of the brain and surrounding structures were obtained without and with intravenous contrast. CONTRAST:  12m MULTIHANCE GADOBENATE DIMEGLUMINE 529 MG/ML IV SOLN COMPARISON:  Brain MRI 10/30/2015 FINDINGS: Brain Parenchyma: T2 hyperintensity centered in the region of the right  central sulcus has increased in extent from the prior examination. Otherwise, scattered multifocal white matter hyperintensity is unchanged. Gradient echo imaging shows mild hemosiderin deposition near the right central sulcus, unchanged. Midline structures are unremarkable. Peripherally contrast enhancing lesion centered within the right parietal lobe is unchanged in size measuring 16 x 9 mm. Right frontal developmental venous anomaly is unchanged. No acute infarct or acute intraparenchymal hemorrhage. Multiple prominent perivascular spaces and old lacunar infarcts are again seen. Ventricles, Sulci and Extra-axial Spaces: Normal for age. No extra-axial collection. Paranasal Sinuses and Mastoids: No fluid levels or advanced mucosal thickening. Orbits: Normal. Bones and Soft Tissues: Nonenhancing T1/T2 hyperintense structure within the left parietal scalp is unchanged. No focal osseous lesion is identified. IMPRESSION: 1. Unchanged size of peripherally contrast-enhancing lesion within the right parietal lobe with slight increase in surrounding hyperintense T2 weighted signal. Again, the findings may indicate posttreatment effect. 2. No new intraparenchymal metastases. Electronically Signed   By: KUlyses JarredM.D.   On: 03/04/2016 09:52   Ct Abdomen Pelvis W Contrast  Result Date: 03/25/2016 CLINICAL DATA:  Restaging lung cancer. History of radiation and chemotherapy. EXAM: CT CHEST AND ABDOMEN WITH CONTRAST TECHNIQUE: Multidetector CT imaging of the chest and abdomen was performed following the standard protocol during bolus administration of intravenous contrast. CONTRAST:  100 cc Isovue-300 COMPARISON:  01/22/2016. FINDINGS: CT CHEST FINDINGS Cardiovascular:  The heart is normal in size. No pericardial effusion. Stable aortic atherosclerotic calcifications but no aneurysm or dissection. The pulmonary arteries are grossly normal. Mediastinum: Small scattered mediastinal and hilar lymph nodes are stable. No mass  or adenopathy. 7 mm right suprahilar lymph node on image 25 previously measured 9.5 mm. The esophagus is grossly normal. Lungs/pleura: Stable right apical lung mass measuring 24 x 14 mm. Surrounding radiation changes are again demonstrated. No new/acute pulmonary findings. No worrisome pulmonary nodules to suggest pulmonary metastatic disease. No pleural effusions. Chest wall: No breast masses, supraclavicular or axillary lymphadenopathy. The thyroid gland appears stable. Tiny nodules are stable. CT ABDOMEN FINDINGS Hepatobiliary: No focal hepatic lesions or intrahepatic biliary dilatation. The gallbladder is normal. No common bile duct dilatation. Pancreas:  Normal. Spleen:  Normal Adrenal/ urinary tract: The adrenal glands and kidneys are unremarkable. The ureters and bladder are unremarkable. Stomach/GI tract: The stomach, duodenum, small bowel and colon are grossly normal. No inflammatory changes, mass lesions or obstructive findings. The terminal ileum is normal. The appendix is normal. Surgical changes involving the rectosigmoid junction. No complicating features. Vascular/lymphatic: The aorta and branch vessels are patent. Moderate stable atherosclerotic calcifications. Small scattered mesenteric and retroperitoneal lymph nodes but no mass or overt adenopathy. Pelvis: The uterus is surgically absent. Both ovaries are still present and appear normal. No pelvic mass or adenopathy. No free pelvic fluid collections. Musculoskeletal: No significant bony findings. Musculoskeletal:  No significant findings. IMPRESSION: 1. Stable 24 x 14 mm right upper lobe pulmonary lesion with surrounding radiation changes. 2. Stable emphysematous changes and pulmonary scarring. 3. No mediastinal or hilar mass or adenopathy. Stable to slightly smaller nodes. 4. No findings for metastatic disease involving the abdomen/pelvis. Electronically Signed   By: Marijo Sanes M.D.   On: 03/25/2016 14:33   ASSESSMENT AND PLAN: This is a  very pleasant 65 years old white female recently diagnosed with a stage IV non-small cell lung cancer, adenocarcinoma with negative EGFR mutation and negative ALK gene translocation. The patient is status post stereotactic radiotherapy to a solitary brain metastases in addition to palliative radiotherapy to the right upper lobe lung mass. She status post 6 cycles of systemic chemotherapy with carboplatin and Alimta with improvement in her disease and she is currently undergoing maintenance chemotherapy with single agent Alimta status post 31 cycles and tolerating her treatment fairly well. The recent CT scan of the chest, abdomen and pelvis showed no evidence for disease progression. I discussed the scan results with the patient and her husband. I recommended for her to continue her current maintenance treatment with single agent Alimta 500 mg/M2 every 3 weeks. She will proceed with cycle #32 today. The patient would come back for follow-up visit in 3 weeks for evaluation before starting cycle #33. The patient was advised to call immediately if she has any concerning symptoms in the interval. The patient voices understanding of current disease status and treatment options and is in agreement with the current care plan.  All questions were answered. The patient knows to call the clinic with any problems, questions or concerns. We can certainly see the patient much sooner if necessary.  Disclaimer: This note was dictated with voice recognition software. Similar sounding words can inadvertently be transcribed and may not be corrected upon review.

## 2016-03-28 NOTE — Patient Instructions (Signed)
[facility] Discharge Instructions for Patients Receiving Chemotherapy  Today you received the following chemotherapy agents Alimta.  To help prevent nausea and vomiting after your treatment, we encourage you to take your nausea medication as prescribed.   If you develop nausea and vomiting that is not controlled by your nausea medication, call the clinic.   BELOW ARE SYMPTOMS THAT SHOULD BE REPORTED IMMEDIATELY:  *FEVER GREATER THAN 100.5 F  *CHILLS WITH OR WITHOUT FEVER  NAUSEA AND VOMITING THAT IS NOT CONTROLLED WITH YOUR NAUSEA MEDICATION  *UNUSUAL SHORTNESS OF BREATH  *UNUSUAL BRUISING OR BLEEDING  TENDERNESS IN MOUTH AND THROAT WITH OR WITHOUT PRESENCE OF ULCERS  *URINARY PROBLEMS  *BOWEL PROBLEMS  UNUSUAL RASH Items with * indicate a potential emergency and should be followed up as soon as possible.  Feel free to call the clinic you have any questions or concerns. The clinic phone number is [phone].  Please show the CHEMO ALERT CARD at check-in to the Emergency Department and triage nurse.   

## 2016-04-04 ENCOUNTER — Other Ambulatory Visit: Payer: BLUE CROSS/BLUE SHIELD

## 2016-04-04 ENCOUNTER — Ambulatory Visit: Payer: BLUE CROSS/BLUE SHIELD | Admitting: Hematology and Oncology

## 2016-04-18 ENCOUNTER — Other Ambulatory Visit (HOSPITAL_BASED_OUTPATIENT_CLINIC_OR_DEPARTMENT_OTHER): Payer: BLUE CROSS/BLUE SHIELD

## 2016-04-18 ENCOUNTER — Telehealth: Payer: Self-pay | Admitting: Internal Medicine

## 2016-04-18 ENCOUNTER — Ambulatory Visit (HOSPITAL_BASED_OUTPATIENT_CLINIC_OR_DEPARTMENT_OTHER): Payer: BLUE CROSS/BLUE SHIELD | Admitting: Internal Medicine

## 2016-04-18 ENCOUNTER — Encounter: Payer: Self-pay | Admitting: Internal Medicine

## 2016-04-18 ENCOUNTER — Ambulatory Visit (HOSPITAL_BASED_OUTPATIENT_CLINIC_OR_DEPARTMENT_OTHER): Payer: BLUE CROSS/BLUE SHIELD

## 2016-04-18 VITALS — BP 114/53 | HR 65 | Temp 98.3°F | Resp 18 | Ht 64.0 in | Wt 164.9 lb

## 2016-04-18 DIAGNOSIS — C3411 Malignant neoplasm of upper lobe, right bronchus or lung: Secondary | ICD-10-CM

## 2016-04-18 DIAGNOSIS — F411 Generalized anxiety disorder: Secondary | ICD-10-CM

## 2016-04-18 DIAGNOSIS — Z5111 Encounter for antineoplastic chemotherapy: Secondary | ICD-10-CM

## 2016-04-18 DIAGNOSIS — C7949 Secondary malignant neoplasm of other parts of nervous system: Secondary | ICD-10-CM

## 2016-04-18 DIAGNOSIS — C7931 Secondary malignant neoplasm of brain: Secondary | ICD-10-CM

## 2016-04-18 LAB — COMPREHENSIVE METABOLIC PANEL
ALT: 14 U/L (ref 0–55)
AST: 12 U/L (ref 5–34)
Albumin: 3.4 g/dL — ABNORMAL LOW (ref 3.5–5.0)
Alkaline Phosphatase: 90 U/L (ref 40–150)
Anion Gap: 9 mEq/L (ref 3–11)
BUN: 12.6 mg/dL (ref 7.0–26.0)
CO2: 26 mEq/L (ref 22–29)
Calcium: 9.6 mg/dL (ref 8.4–10.4)
Chloride: 106 mEq/L (ref 98–109)
Creatinine: 0.8 mg/dL (ref 0.6–1.1)
EGFR: 78 mL/min/{1.73_m2} — ABNORMAL LOW (ref >90)
Glucose: 132 mg/dl (ref 70–140)
Potassium: 4 mEq/L (ref 3.5–5.1)
Sodium: 141 mEq/L (ref 136–145)
Total Bilirubin: 0.4 mg/dL (ref 0.20–1.20)
Total Protein: 7 g/dL (ref 6.4–8.3)

## 2016-04-18 LAB — CBC WITH DIFFERENTIAL/PLATELET
BASO%: 0 % (ref 0.0–2.0)
Basophils Absolute: 0 10*3/uL (ref 0.0–0.1)
EOS%: 0 % (ref 0.0–7.0)
Eosinophils Absolute: 0 10*3/uL (ref 0.0–0.5)
HCT: 36.1 % (ref 34.8–46.6)
HGB: 11.9 g/dL (ref 11.6–15.9)
LYMPH%: 4.3 % — ABNORMAL LOW (ref 14.0–49.7)
MCH: 33 pg (ref 25.1–34.0)
MCHC: 33 g/dL (ref 31.5–36.0)
MCV: 100 fL (ref 79.5–101.0)
MONO#: 0.5 10*3/uL (ref 0.1–0.9)
MONO%: 7.5 % (ref 0.0–14.0)
NEUT#: 6.3 10*3/uL (ref 1.5–6.5)
NEUT%: 88.2 % — ABNORMAL HIGH (ref 38.4–76.8)
Platelets: 262 10*3/uL (ref 145–400)
RBC: 3.61 10*6/uL — ABNORMAL LOW (ref 3.70–5.45)
RDW: 14.8 % — ABNORMAL HIGH (ref 11.2–14.5)
WBC: 7.2 10*3/uL (ref 3.9–10.3)
lymph#: 0.3 10*3/uL — ABNORMAL LOW (ref 0.9–3.3)

## 2016-04-18 MED ORDER — SODIUM CHLORIDE 0.9 % IV SOLN
Freq: Once | INTRAVENOUS | Status: AC
  Administered 2016-04-18: 10:00:00 via INTRAVENOUS

## 2016-04-18 MED ORDER — CYANOCOBALAMIN 1000 MCG/ML IJ SOLN
1000.0000 ug | Freq: Once | INTRAMUSCULAR | Status: AC
  Administered 2016-04-18: 1000 ug via INTRAMUSCULAR

## 2016-04-18 MED ORDER — ALPRAZOLAM 0.25 MG PO TABS: 0.2500 mg | ORAL_TABLET | Freq: Three times a day (TID) | ORAL | 0 refills | Status: DC | PRN

## 2016-04-18 MED ORDER — CYANOCOBALAMIN 1000 MCG/ML IJ SOLN
INTRAMUSCULAR | Status: AC
  Filled 2016-04-18: qty 1

## 2016-04-18 MED ORDER — SODIUM CHLORIDE 0.9 % IV SOLN
500.0000 mg/m2 | Freq: Once | INTRAVENOUS | Status: AC
  Administered 2016-04-18: 900 mg via INTRAVENOUS
  Filled 2016-04-18: qty 20

## 2016-04-18 MED ORDER — SODIUM CHLORIDE 0.9 % IV SOLN
Freq: Once | INTRAVENOUS | Status: AC
  Administered 2016-04-18: 10:00:00 via INTRAVENOUS
  Filled 2016-04-18: qty 4

## 2016-04-18 NOTE — Telephone Encounter (Signed)
Gave patient relative avs report and appointments for September and October.

## 2016-04-18 NOTE — Progress Notes (Signed)
[place] [phone]   Fax:([phone]) [place], MD [address] 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  PRIOR THERAPY:  1) Status post stereotactic radiotherapy to a solitary right parietal brain lesion under the care of Dr. [name] on 10/16/2013. 2) Status post palliative radiotherapy to the right lung tumor under the care of Dr. [name] completed on 12/05/2013. 3) Systemic chemotherapy with carboplatin for AUC of 5 and Alimta 500 mg/M2 every 3 weeks. First dose Jan 06 2014. Status post 6 cycles.  CURRENT THERAPY: Systemic chemotherapy with maintenance Alimta 500 mg/M2 every 3 weeks. Status post 32 cycles.  INTERVAL HISTORY: Alexis [name] 65 y.o. female returns to the clinic today for 3 weeks followup visit accompanied by her husband. The patient is feeling fine today and denied having any specific complaints. She is tolerating her maintenance chemotherapy with Alimta fairly well. She status post 32 cycles. She denied having any significant chest pain, shortness of breath, cough or hemoptysis. She has no fever or chills, no nausea or vomiting. She denied having any significant weight loss or night sweats. She is here today for evaluation before starting cycle #33.  MEDICAL HISTORY: Past Medical History:  Diagnosis Date  . Anxiety   . Cervical cancer ([place])   . Encounter for antineoplastic chemotherapy 07/20/2015  . Malignant neoplasm of right upper lobe of lung (HCC)     non small cell lung cancer adenocarcioma with brain meta     ALLERGIES:  is allergic to codeine.  MEDICATIONS:  Current Outpatient Prescriptions  Medication Sig Dispense Refill  . acetaminophen (TYLENOL) 500 MG tablet Take 500 mg by mouth every 6 (six) hours as needed for mild pain or headache. Reported on 11/02/2015    . ALPRAZolam (XANAX) 0.25 MG tablet Take 1 tablet (0.25 mg total) by mouth 3 (three) times daily as needed. for anxiety 45 tablet 0  . Ascorbic Acid (VITAMIN C GUMMIE PO) Take 1 each by mouth every morning.    Marland Kitchen dexamethasone (DECADRON) 4 MG tablet 4 mg by mouth twice a day the day before, day of and day after the chemotherapy every 3 weeks 40 tablet 1  . folic acid (FOLVITE) 1 MG tablet TAKE 1 TABLET (1 MG TOTAL) BY MOUTH DAILY. 30 tablet 0  . Multiple Vitamin (MULTIVITAMIN WITH MINERALS) TABS tablet Take 1 tablet by mouth every morning.    Marland Kitchen omeprazole (PRILOSEC) 20 MG capsule Take 1 capsule (20 mg total) by mouth daily. As needed for reflux or indigestion. 42 capsule PRN  . OVER THE COUNTER MEDICATION Take 1 tablet by mouth every morning. (Vitamin A)    . pentoxifylline (TRENTAL) 400 MG CR tablet TAKE ONE TABLET BY MOUTH TWICE DAILY, ALSO  TAKE  VITAMIN  E  400  UNITS  TWICE  DAILY  WITH  PENTOXIFYLLINE 60 tablet 0  . senna-docusate (SENOKOT-S) 8.6-50 MG tablet Take 1 tablet by mouth daily. 30 tablet prn  . Zinc 50 MG TABS Take 1 tablet by mouth every morning.    . ondansetron (ZOFRAN) 8 MG tablet  Take 1 tablet (8 mg total) by mouth every 8 (eight) hours as needed for nausea or vomiting. (Patient not taking: Reported on 03/07/2016) 30 tablet 0  . prochlorperazine (COMPAZINE) 10 MG tablet Take 1 tablet (10 mg total) by mouth every 6 (six) hours as needed for nausea or vomiting. (Patient not taking: Reported on 03/07/2016) 60 tablet 0   No current facility-administered medications for this visit.     SURGICAL HISTORY:  Past Surgical History:  Procedure Laterality Date  . ABDOMINAL HYSTERECTOMY    . COLOSTOMY TAKEDOWN N/A 07/10/2014    Procedure: LAPAROSCOPIC LYSIS OF ADHESIONS (90 MIN) LAPAROSCOPIC ASSISTED COLOSTOMY CLOSURE, RIGID PROCTOSIGMOIDOSCOPY;  Surgeon: Jackolyn Confer, MD;  Location: [facility];  Service: General;  Laterality: N/A;  . LAPAROTOMY N/A 11/03/2013   Procedure: EXPLORATORY LAPAROTOMY, DRAINAGE OF INTRA  ABDOMINAL ABSCESSES, MOBILIZATION OF SPLENIC FLEXURE, SIGMOID COLECTOMY WITH COLOSTOMY;  Surgeon: Odis Hollingshead, MD;  Location: [facility];  Service: General;  Laterality: N/A;  . VIDEO BRONCHOSCOPY Bilateral 08/30/2013   Procedure: VIDEO BRONCHOSCOPY WITH FLUORO;  Surgeon: Tanda Rockers, MD;  Location: [facility];  Service: Cardiopulmonary;  Laterality: Bilateral;    REVIEW OF SYSTEMS:  A comprehensive review of systems was negative.   PHYSICAL EXAMINATION: General appearance: alert, cooperative, fatigued and no distress Head: Normocephalic, without obvious abnormality, atraumatic Neck: no adenopathy, no JVD, supple, symmetrical, trachea midline and thyroid not enlarged, symmetric, no tenderness/mass/nodules Lymph nodes: Cervical, supraclavicular, and axillary nodes normal. Resp: clear to auscultation bilaterally Back: symmetric, no curvature. ROM normal. No CVA tenderness. Cardio: regular rate and rhythm, S1, S2 normal, no murmur, click, rub or gallop GI: soft, non-tender; bowel sounds normal; no masses,  no organomegaly Extremities: extremities normal, atraumatic, no cyanosis or edema Neurologic: Alert and oriented X 3, normal strength and tone. Normal symmetric reflexes. Normal coordination and gait  ECOG PERFORMANCE STATUS: 0 - Asymptomatic  Blood pressure (!) 114/53, pulse 65, temperature 98.3 F (36.8 C), temperature source Oral, resp. rate 18, height 5' 4"  (1.626 m), weight 164 lb 14.4 oz (74.8 kg), SpO2 100 %.  LABORATORY DATA: Lab Results  Component Value Date   WBC 7.2 04/18/2016   HGB 11.9 04/18/2016   HCT 36.1 04/18/2016   MCV 100.0 04/18/2016   PLT 262 04/18/2016      Chemistry       Component Value Date/Time   NA 141 03/28/2016 0822   K 4.1 03/28/2016 0822   CL 100 07/12/2014 0512   CO2 24 03/28/2016 0822   BUN 12.2 03/28/2016 0822   CREATININE 0.8 03/28/2016 0822      Component Value Date/Time   CALCIUM 9.6 03/28/2016 0822   ALKPHOS 99 03/28/2016 0822   AST 14 03/28/2016 0822   ALT 23 03/28/2016 0822   BILITOT 0.40 03/28/2016 0822       RADIOGRAPHIC STUDIES: Ct Chest W Contrast  Result Date: 03/25/2016 CLINICAL DATA:  Restaging lung cancer. History of radiation and chemotherapy. EXAM: CT CHEST AND ABDOMEN WITH CONTRAST TECHNIQUE: Multidetector CT imaging of the chest and abdomen was performed following the standard protocol during bolus administration of intravenous contrast. CONTRAST:  100 cc Isovue-300 COMPARISON:  01/22/2016. FINDINGS: CT CHEST FINDINGS Cardiovascular: The heart is normal in size. No pericardial effusion. Stable aortic atherosclerotic calcifications but no aneurysm or dissection. The pulmonary arteries are grossly normal. Mediastinum: Small scattered mediastinal and hilar lymph nodes are stable. No mass or adenopathy. 7 mm right suprahilar lymph node on image 25 previously measured 9.5 mm. The esophagus  is grossly normal. Lungs/pleura: Stable right apical lung mass measuring 24 x 14 mm. Surrounding radiation changes are again demonstrated. No new/acute pulmonary findings. No worrisome pulmonary nodules to suggest pulmonary metastatic disease. No pleural effusions. Chest wall: No breast masses, supraclavicular or axillary lymphadenopathy. The thyroid gland appears stable. Tiny nodules are stable. CT ABDOMEN FINDINGS Hepatobiliary: No focal hepatic lesions or intrahepatic biliary dilatation. The gallbladder is normal. No common bile duct dilatation. Pancreas:  Normal. Spleen:  Normal Adrenal/ urinary tract: The adrenal glands and kidneys are unremarkable. The ureters and bladder are unremarkable. Stomach/GI tract: The stomach, duodenum, small bowel  and colon are grossly normal. No inflammatory changes, mass lesions or obstructive findings. The terminal ileum is normal. The appendix is normal. Surgical changes involving the rectosigmoid junction. No complicating features. Vascular/lymphatic: The aorta and branch vessels are patent. Moderate stable atherosclerotic calcifications. Small scattered mesenteric and retroperitoneal lymph nodes but no mass or overt adenopathy. Pelvis: The uterus is surgically absent. Both ovaries are still present and appear normal. No pelvic mass or adenopathy. No free pelvic fluid collections. Musculoskeletal: No significant bony findings. Musculoskeletal:  No significant findings. IMPRESSION: 1. Stable 24 x 14 mm right upper lobe pulmonary lesion with surrounding radiation changes. 2. Stable emphysematous changes and pulmonary scarring. 3. No mediastinal or hilar mass or adenopathy. Stable to slightly smaller nodes. 4. No findings for metastatic disease involving the abdomen/pelvis. Electronically Signed   By: Marijo Sanes M.D.   On: 03/25/2016 14:33  Ct Abdomen Pelvis W Contrast  Result Date: 03/25/2016 CLINICAL DATA:  Restaging lung cancer. History of radiation and chemotherapy. EXAM: CT CHEST AND ABDOMEN WITH CONTRAST TECHNIQUE: Multidetector CT imaging of the chest and abdomen was performed following the standard protocol during bolus administration of intravenous contrast. CONTRAST:  100 cc Isovue-300 COMPARISON:  01/22/2016. FINDINGS: CT CHEST FINDINGS Cardiovascular: The heart is normal in size. No pericardial effusion. Stable aortic atherosclerotic calcifications but no aneurysm or dissection. The pulmonary arteries are grossly normal. Mediastinum: Small scattered mediastinal and hilar lymph nodes are stable. No mass or adenopathy. 7 mm right suprahilar lymph node on image 25 previously measured 9.5 mm. The esophagus is grossly normal. Lungs/pleura: Stable right apical lung mass measuring 24 x 14 mm. Surrounding  radiation changes are again demonstrated. No new/acute pulmonary findings. No worrisome pulmonary nodules to suggest pulmonary metastatic disease. No pleural effusions. Chest wall: No breast masses, supraclavicular or axillary lymphadenopathy. The thyroid gland appears stable. Tiny nodules are stable. CT ABDOMEN FINDINGS Hepatobiliary: No focal hepatic lesions or intrahepatic biliary dilatation. The gallbladder is normal. No common bile duct dilatation. Pancreas:  Normal. Spleen:  Normal Adrenal/ urinary tract: The adrenal glands and kidneys are unremarkable. The ureters and bladder are unremarkable. Stomach/GI tract: The stomach, duodenum, small bowel and colon are grossly normal. No inflammatory changes, mass lesions or obstructive findings. The terminal ileum is normal. The appendix is normal. Surgical changes involving the rectosigmoid junction. No complicating features. Vascular/lymphatic: The aorta and branch vessels are patent. Moderate stable atherosclerotic calcifications. Small scattered mesenteric and retroperitoneal lymph nodes but no mass or overt adenopathy. Pelvis: The uterus is surgically absent. Both ovaries are still present and appear normal. No pelvic mass or adenopathy. No free pelvic fluid collections. Musculoskeletal: No significant bony findings. Musculoskeletal:  No significant findings. IMPRESSION: 1. Stable 24 x 14 mm right upper lobe pulmonary lesion with surrounding radiation changes. 2. Stable emphysematous changes and pulmonary scarring. 3. No mediastinal or hilar mass or adenopathy. Stable to slightly smaller nodes.  4. No findings for metastatic disease involving the abdomen/pelvis. Electronically Signed   By: Marijo Sanes M.D.   On: 03/25/2016 14:33   ASSESSMENT AND PLAN: This is a very pleasant 65 years old white female recently diagnosed with a stage IV non-small cell lung cancer, adenocarcinoma with negative EGFR mutation and negative ALK gene translocation. The patient is  status post stereotactic radiotherapy to a solitary brain metastases in addition to palliative radiotherapy to the right upper lobe lung mass. She status post 6 cycles of systemic chemotherapy with carboplatin and Alimta with improvement in her disease and she is currently undergoing maintenance chemotherapy with single agent Alimta status post 32 cycles and tolerating her treatment fairly well. I recommended for her to continue her current maintenance treatment with single agent Alimta 500 mg/M2 every 3 weeks. She will proceed with cycle #33 today. The patient would come back for follow-up visit in 3 weeks for evaluation before starting cycle #34. The patient was advised to call immediately if she has any concerning symptoms in the interval. The patient voices understanding of current disease status and treatment options and is in agreement with the current care plan.  All questions were answered. The patient knows to call the clinic with any problems, questions or concerns. We can certainly see the patient much sooner if necessary.  Disclaimer: This note was dictated with voice recognition software. Similar sounding words can inadvertently be transcribed and may not be corrected upon review.

## 2016-04-18 NOTE — Patient Instructions (Signed)
[facility] Discharge Instructions for Patients Receiving Chemotherapy  Today you received the following chemotherapy agents Alimta.  To help prevent nausea and vomiting after your treatment, we encourage you to take your nausea medication as prescribed.   If you develop nausea and vomiting that is not controlled by your nausea medication, call the clinic.   BELOW ARE SYMPTOMS THAT SHOULD BE REPORTED IMMEDIATELY:  *FEVER GREATER THAN 100.5 F  *CHILLS WITH OR WITHOUT FEVER  NAUSEA AND VOMITING THAT IS NOT CONTROLLED WITH YOUR NAUSEA MEDICATION  *UNUSUAL SHORTNESS OF BREATH  *UNUSUAL BRUISING OR BLEEDING  TENDERNESS IN MOUTH AND THROAT WITH OR WITHOUT PRESENCE OF ULCERS  *URINARY PROBLEMS  *BOWEL PROBLEMS  UNUSUAL RASH Items with * indicate a potential emergency and should be followed up as soon as possible.  Feel free to call the clinic you have any questions or concerns. The clinic phone number is [phone].  Please show the CHEMO ALERT CARD at check-in to the Emergency Department and triage nurse.   

## 2016-04-22 ENCOUNTER — Other Ambulatory Visit: Payer: Self-pay | Admitting: Internal Medicine

## 2016-05-04 ENCOUNTER — Encounter: Payer: Self-pay | Admitting: *Deleted

## 2016-05-09 ENCOUNTER — Telehealth: Payer: Self-pay | Admitting: *Deleted

## 2016-05-09 ENCOUNTER — Other Ambulatory Visit (HOSPITAL_BASED_OUTPATIENT_CLINIC_OR_DEPARTMENT_OTHER): Payer: BLUE CROSS/BLUE SHIELD

## 2016-05-09 ENCOUNTER — Ambulatory Visit (HOSPITAL_BASED_OUTPATIENT_CLINIC_OR_DEPARTMENT_OTHER): Payer: BLUE CROSS/BLUE SHIELD

## 2016-05-09 ENCOUNTER — Encounter: Payer: Self-pay | Admitting: Internal Medicine

## 2016-05-09 ENCOUNTER — Ambulatory Visit (HOSPITAL_BASED_OUTPATIENT_CLINIC_OR_DEPARTMENT_OTHER): Payer: BLUE CROSS/BLUE SHIELD | Admitting: Internal Medicine

## 2016-05-09 ENCOUNTER — Telehealth: Payer: Self-pay | Admitting: Internal Medicine

## 2016-05-09 VITALS — BP 128/49 | HR 77 | Temp 98.3°F | Resp 18 | Wt 167.0 lb

## 2016-05-09 DIAGNOSIS — Z5111 Encounter for antineoplastic chemotherapy: Secondary | ICD-10-CM | POA: Diagnosis not present

## 2016-05-09 DIAGNOSIS — C7931 Secondary malignant neoplasm of brain: Secondary | ICD-10-CM

## 2016-05-09 DIAGNOSIS — C3411 Malignant neoplasm of upper lobe, right bronchus or lung: Secondary | ICD-10-CM

## 2016-05-09 DIAGNOSIS — K219 Gastro-esophageal reflux disease without esophagitis: Secondary | ICD-10-CM

## 2016-05-09 LAB — CBC WITH DIFFERENTIAL/PLATELET
BASO%: 0 % (ref 0.0–2.0)
Basophils Absolute: 0 10*3/uL (ref 0.0–0.1)
EOS%: 0 % (ref 0.0–7.0)
Eosinophils Absolute: 0 10*3/uL (ref 0.0–0.5)
HCT: 36 % (ref 34.8–46.6)
HGB: 11.6 g/dL (ref 11.6–15.9)
LYMPH%: 5.6 % — ABNORMAL LOW (ref 14.0–49.7)
MCH: 32.3 pg (ref 25.1–34.0)
MCHC: 32.2 g/dL (ref 31.5–36.0)
MCV: 100.3 fL (ref 79.5–101.0)
MONO#: 0.4 10*3/uL (ref 0.1–0.9)
MONO%: 6.4 % (ref 0.0–14.0)
NEUT#: 5.7 10*3/uL (ref 1.5–6.5)
NEUT%: 88 % — ABNORMAL HIGH (ref 38.4–76.8)
Platelets: 271 10*3/uL (ref 145–400)
RBC: 3.59 10*6/uL — ABNORMAL LOW (ref 3.70–5.45)
RDW: 15 % — ABNORMAL HIGH (ref 11.2–14.5)
WBC: 6.5 10*3/uL (ref 3.9–10.3)
lymph#: 0.4 10*3/uL — ABNORMAL LOW (ref 0.9–3.3)

## 2016-05-09 LAB — COMPREHENSIVE METABOLIC PANEL
ALT: 22 U/L (ref 0–55)
AST: 14 U/L (ref 5–34)
Albumin: 3.4 g/dL — ABNORMAL LOW (ref 3.5–5.0)
Alkaline Phosphatase: 95 U/L (ref 40–150)
Anion Gap: 10 mEq/L (ref 3–11)
BUN: 11.4 mg/dL (ref 7.0–26.0)
CO2: 23 mEq/L (ref 22–29)
Calcium: 9.3 mg/dL (ref 8.4–10.4)
Chloride: 109 mEq/L (ref 98–109)
Creatinine: 0.7 mg/dL (ref 0.6–1.1)
EGFR: 85 mL/min/{1.73_m2} — ABNORMAL LOW (ref >90)
Glucose: 116 mg/dl (ref 70–140)
Potassium: 3.6 mEq/L (ref 3.5–5.1)
Sodium: 142 mEq/L (ref 136–145)
Total Bilirubin: 0.34 mg/dL (ref 0.20–1.20)
Total Protein: 6.9 g/dL (ref 6.4–8.3)

## 2016-05-09 MED ORDER — DEXAMETHASONE SODIUM PHOSPHATE 100 MG/10ML IJ SOLN
Freq: Once | INTRAMUSCULAR | Status: AC
  Administered 2016-05-09: 10:00:00 via INTRAVENOUS
  Filled 2016-05-09: qty 4

## 2016-05-09 MED ORDER — SODIUM CHLORIDE 0.9 % IV SOLN
Freq: Once | INTRAVENOUS | Status: AC
  Administered 2016-05-09: 10:00:00 via INTRAVENOUS

## 2016-05-09 MED ORDER — SODIUM CHLORIDE 0.9 % IV SOLN
500.0000 mg/m2 | Freq: Once | INTRAVENOUS | Status: AC
  Administered 2016-05-09: 900 mg via INTRAVENOUS
  Filled 2016-05-09: qty 16

## 2016-05-09 NOTE — Telephone Encounter (Signed)
Patient declined avs report and appt schd. 05/09/16

## 2016-05-09 NOTE — Telephone Encounter (Signed)
Called [facility] [address] about patient's trental rx, I had faxed it on 05/04/16, for 90 day supply with 3 refills from Dr. [name], per pharmacist they did get that  And the rx is on hold till [place], then they will fill the 90 day order for the patient, disregard their faxed request  Thanked female pharmacist didn't get her name, not given 11:01 AM

## 2016-05-09 NOTE — Patient Instructions (Signed)
[facility] Discharge Instructions for Patients Receiving Chemotherapy  Today you received the following chemotherapy agents; Alimta.   To help prevent nausea and vomiting after your treatment, we encourage you to take your nausea medication as directed.    If you develop nausea and vomiting that is not controlled by your nausea medication, call the clinic.   BELOW ARE SYMPTOMS THAT SHOULD BE REPORTED IMMEDIATELY:  *FEVER GREATER THAN 100.5 F  *CHILLS WITH OR WITHOUT FEVER  NAUSEA AND VOMITING THAT IS NOT CONTROLLED WITH YOUR NAUSEA MEDICATION  *UNUSUAL SHORTNESS OF BREATH  *UNUSUAL BRUISING OR BLEEDING  TENDERNESS IN MOUTH AND THROAT WITH OR WITHOUT PRESENCE OF ULCERS  *URINARY PROBLEMS  *BOWEL PROBLEMS  UNUSUAL RASH Items with * indicate a potential emergency and should be followed up as soon as possible.  Feel free to call the clinic you have any questions or concerns. The clinic phone number is [phone].  Please show the CHEMO ALERT CARD at check-in to the Emergency Department and triage nurse.   

## 2016-05-09 NOTE — Progress Notes (Signed)
[place] [phone]   Fax:(336) [place], MD [address] [place] [address]  DIAGNOSIS: Stage IV (T2a, N0, M1b) non-small cell lung cancer of the right upper lobe consistent with adenocarcinoma with negative EGFR mutation and negative ALK gene translocation diagnosed in January of 2015 presented with right upper lobe lung mass in addition to a solitary brain metastasis status post stereotactic to a solitary brain metastasis as well as radiotherapy to the right upper lobe lung mass.  PRIOR THERAPY:  1) Status post stereotactic radiotherapy to a solitary right parietal brain lesion under the care of Dr. [name] on 10/16/2013. 2) Status post palliative radiotherapy to the right lung tumor under the care of Dr. [name] completed on 12/05/2013. 3) Systemic chemotherapy with carboplatin for AUC of 5 and Alimta 500 mg/M2 every 3 weeks. First dose Jan 06 2014. Status post 6 cycles.  CURRENT THERAPY: Systemic chemotherapy with maintenance Alimta 500 mg/M2 every 3 weeks. Status post 33 cycles.  INTERVAL HISTORY: Alexis [name] 65 y.o. female returns to the clinic today for 3 weeks followup visit accompanied by her husband. The patient is feeling fine today and denied having any specific complaints. She continues to have heartburn and she is currently on Prilosec 20 mg by mouth daily. She is tolerating her maintenance chemotherapy with Alimta fairly well. She status post 33 cycles. She denied having any significant chest pain, shortness of breath, cough or hemoptysis. She has no fever or chills, no nausea or vomiting. She denied having any significant weight loss or night sweats. She is here today for evaluation before starting cycle #34.  MEDICAL HISTORY: Past Medical History:  Diagnosis Date  . Anxiety   . Cervical cancer ([place])   . Encounter for antineoplastic chemotherapy 07/20/2015  . Malignant neoplasm of right upper lobe of  lung (HCC)     non small cell lung cancer adenocarcioma with brain meta    ALLERGIES:  is allergic to codeine.  MEDICATIONS:  Current Outpatient Prescriptions  Medication Sig Dispense Refill  . acetaminophen (TYLENOL) 500 MG tablet Take 500 mg by mouth every 6 (six) hours as needed for mild pain or headache. Reported on 11/02/2015    . ALPRAZolam (XANAX) 0.25 MG tablet Take 1 tablet (0.25 mg total) by mouth 3 (three) times daily as needed. for anxiety 45 tablet 0  . Ascorbic Acid (VITAMIN C GUMMIE PO) Take 1 each by mouth every morning.    Marland Kitchen dexamethasone (DECADRON) 4 MG tablet 4 mg by mouth twice a day the day before, day of and day after the chemotherapy every 3 weeks 40 tablet 1  . folic acid (FOLVITE) 1 MG tablet TAKE 1 TABLET (1 MG TOTAL) BY MOUTH DAILY. 30 tablet 0  . Multiple Vitamin (MULTIVITAMIN WITH MINERALS) TABS tablet Take 1 tablet by mouth every morning.    Marland Kitchen omeprazole (PRILOSEC) 20 MG capsule Take 1 capsule (20 mg total) by mouth daily. As needed for reflux or indigestion. 42 capsule PRN  . ondansetron (ZOFRAN) 8 MG tablet Take 1 tablet (8 mg total) by mouth every 8 (eight) hours as needed for nausea or vomiting. (Patient not taking: Reported on 03/07/2016) 30 tablet 0  . OVER THE COUNTER MEDICATION Take 1 tablet by mouth every morning. (Vitamin A)    . pentoxifylline (TRENTAL) 400 MG CR tablet TAKE ONE TABLET BY MOUTH TWICE DAILY, ALSO  TAKE  VITAMIN  E  400  UNITS  TWICE  DAILY  WITH  PENTOXIFYLLINE 60 tablet 0  . prochlorperazine (COMPAZINE) 10 MG tablet Take 1 tablet (10 mg total) by mouth every 6 (six) hours as needed for nausea or vomiting. (Patient not taking: Reported on 03/07/2016) 60 tablet 0  . senna-docusate (SENOKOT-S) 8.6-50 MG tablet Take 1 tablet by mouth daily. 30 tablet prn  . Zinc 50 MG TABS Take 1 tablet by mouth every morning.     No current facility-administered medications for this visit.     SURGICAL HISTORY:  Past Surgical History:  Procedure  Laterality Date  . ABDOMINAL HYSTERECTOMY    . COLOSTOMY TAKEDOWN N/A 07/10/2014   Procedure: LAPAROSCOPIC LYSIS OF ADHESIONS (90 MIN) LAPAROSCOPIC ASSISTED COLOSTOMY CLOSURE, RIGID PROCTOSIGMOIDOSCOPY;  Surgeon: Jackolyn Confer, MD;  Location: [facility];  Service: General;  Laterality: N/A;  . LAPAROTOMY N/A 11/03/2013   Procedure: EXPLORATORY LAPAROTOMY, DRAINAGE OF INTRA  ABDOMINAL ABSCESSES, MOBILIZATION OF SPLENIC FLEXURE, SIGMOID COLECTOMY WITH COLOSTOMY;  Surgeon: Odis Hollingshead, MD;  Location: [facility];  Service: General;  Laterality: N/A;  . VIDEO BRONCHOSCOPY Bilateral 08/30/2013   Procedure: VIDEO BRONCHOSCOPY WITH FLUORO;  Surgeon: Tanda Rockers, MD;  Location: [facility];  Service: Cardiopulmonary;  Laterality: Bilateral;    REVIEW OF SYSTEMS:  A comprehensive review of systems was negative.   PHYSICAL EXAMINATION: General appearance: alert, cooperative, fatigued and no distress Head: Normocephalic, without obvious abnormality, atraumatic Neck: no adenopathy, no JVD, supple, symmetrical, trachea midline and thyroid not enlarged, symmetric, no tenderness/mass/nodules Lymph nodes: Cervical, supraclavicular, and axillary nodes normal. Resp: clear to auscultation bilaterally Back: symmetric, no curvature. ROM normal. No CVA tenderness. Cardio: regular rate and rhythm, S1, S2 normal, no murmur, click, rub or gallop GI: soft, non-tender; bowel sounds normal; no masses,  no organomegaly Extremities: extremities normal, atraumatic, no cyanosis or edema Neurologic: Alert and oriented X 3, normal strength and tone. Normal symmetric reflexes. Normal coordination and gait  ECOG PERFORMANCE STATUS: 0 - Asymptomatic  There were no vitals taken for this visit.  LABORATORY DATA: Lab Results  Component Value Date   WBC 6.5 05/09/2016   HGB 11.6 05/09/2016   HCT 36.0 05/09/2016   MCV 100.3 05/09/2016   PLT 271 05/09/2016      Chemistry      Component Value Date/Time   NA 141  04/18/2016 0849   K 4.0 04/18/2016 0849   CL 100 07/12/2014 0512   CO2 26 04/18/2016 0849   BUN 12.6 04/18/2016 0849   CREATININE 0.8 04/18/2016 0849      Component Value Date/Time   CALCIUM 9.6 04/18/2016 0849   ALKPHOS 90 04/18/2016 0849   AST 12 04/18/2016 0849   ALT 14 04/18/2016 0849   BILITOT 0.40 04/18/2016 0849       RADIOGRAPHIC STUDIES: No results found.  ASSESSMENT AND PLAN: This is a very pleasant 65 years old white female recently diagnosed with a stage IV non-small cell lung cancer, adenocarcinoma with negative EGFR mutation and negative ALK gene translocation. The patient is status post stereotactic radiotherapy to a solitary brain metastases in addition to palliative radiotherapy to the right upper lobe lung mass. She status post 6 cycles of systemic chemotherapy with carboplatin and Alimta with improvement in her disease and she is currently undergoing maintenance chemotherapy with single agent Alimta status post 33 cycles and tolerating her treatment fairly well. I recommended for her to continue her current maintenance treatment with single agent Alimta 500 mg/M2 every 3 weeks. She will proceed  with cycle #34 today. The patient would come back for follow-up visit in 3 weeks for evaluation before starting cycle #35. For the GERD, I advised her to increase her dose of Prilosec to 20 mg by mouth twice a day for one week. The patient was advised to call immediately if she has any concerning symptoms in the interval. The patient voices understanding of current disease status and treatment options and is in agreement with the current care plan.  All questions were answered. The patient knows to call the clinic with any problems, questions or concerns. We can certainly see the patient much sooner if necessary.  Disclaimer: This note was dictated with voice recognition software. Similar sounding words can inadvertently be transcribed and may not be corrected upon  review.

## 2016-05-16 ENCOUNTER — Other Ambulatory Visit: Payer: Self-pay | Admitting: *Deleted

## 2016-05-16 DIAGNOSIS — C7931 Secondary malignant neoplasm of brain: Secondary | ICD-10-CM

## 2016-05-16 DIAGNOSIS — C7949 Secondary malignant neoplasm of other parts of nervous system: Principal | ICD-10-CM

## 2016-05-25 ENCOUNTER — Other Ambulatory Visit: Payer: Self-pay | Admitting: Internal Medicine

## 2016-05-30 ENCOUNTER — Other Ambulatory Visit (HOSPITAL_BASED_OUTPATIENT_CLINIC_OR_DEPARTMENT_OTHER): Payer: BLUE CROSS/BLUE SHIELD

## 2016-05-30 ENCOUNTER — Ambulatory Visit (HOSPITAL_BASED_OUTPATIENT_CLINIC_OR_DEPARTMENT_OTHER): Payer: BLUE CROSS/BLUE SHIELD

## 2016-05-30 ENCOUNTER — Encounter: Payer: Self-pay | Admitting: Internal Medicine

## 2016-05-30 ENCOUNTER — Telehealth: Payer: Self-pay | Admitting: Internal Medicine

## 2016-05-30 ENCOUNTER — Ambulatory Visit (HOSPITAL_BASED_OUTPATIENT_CLINIC_OR_DEPARTMENT_OTHER): Payer: BLUE CROSS/BLUE SHIELD | Admitting: Internal Medicine

## 2016-05-30 DIAGNOSIS — Z23 Encounter for immunization: Secondary | ICD-10-CM

## 2016-05-30 DIAGNOSIS — C3411 Malignant neoplasm of upper lobe, right bronchus or lung: Secondary | ICD-10-CM

## 2016-05-30 DIAGNOSIS — C7951 Secondary malignant neoplasm of bone: Secondary | ICD-10-CM

## 2016-05-30 DIAGNOSIS — F419 Anxiety disorder, unspecified: Secondary | ICD-10-CM

## 2016-05-30 DIAGNOSIS — Z5111 Encounter for antineoplastic chemotherapy: Secondary | ICD-10-CM | POA: Diagnosis not present

## 2016-05-30 DIAGNOSIS — F411 Generalized anxiety disorder: Secondary | ICD-10-CM

## 2016-05-30 DIAGNOSIS — C7931 Secondary malignant neoplasm of brain: Secondary | ICD-10-CM

## 2016-05-30 DIAGNOSIS — C7949 Secondary malignant neoplasm of other parts of nervous system: Secondary | ICD-10-CM

## 2016-05-30 LAB — COMPREHENSIVE METABOLIC PANEL
ALT: 17 U/L (ref 0–55)
AST: 12 U/L (ref 5–34)
Albumin: 3.5 g/dL (ref 3.5–5.0)
Alkaline Phosphatase: 99 U/L (ref 40–150)
Anion Gap: 11 mEq/L (ref 3–11)
BUN: 13.9 mg/dL (ref 7.0–26.0)
CO2: 24 mEq/L (ref 22–29)
Calcium: 9.4 mg/dL (ref 8.4–10.4)
Chloride: 107 mEq/L (ref 98–109)
Creatinine: 0.8 mg/dL (ref 0.6–1.1)
EGFR: 80 mL/min/{1.73_m2} — ABNORMAL LOW (ref >90)
Glucose: 134 mg/dl (ref 70–140)
Potassium: 4 mEq/L (ref 3.5–5.1)
Sodium: 141 mEq/L (ref 136–145)
Total Bilirubin: 0.37 mg/dL (ref 0.20–1.20)
Total Protein: 7.1 g/dL (ref 6.4–8.3)

## 2016-05-30 LAB — CBC WITH DIFFERENTIAL/PLATELET
BASO%: 0 % (ref 0.0–2.0)
Basophils Absolute: 0 10*3/uL (ref 0.0–0.1)
EOS%: 0 % (ref 0.0–7.0)
Eosinophils Absolute: 0 10*3/uL (ref 0.0–0.5)
HCT: 36.7 % (ref 34.8–46.6)
HGB: 12.1 g/dL (ref 11.6–15.9)
LYMPH%: 3.8 % — ABNORMAL LOW (ref 14.0–49.7)
MCH: 33.2 pg (ref 25.1–34.0)
MCHC: 33 g/dL (ref 31.5–36.0)
MCV: 100.5 fL (ref 79.5–101.0)
MONO#: 0.4 10*3/uL (ref 0.1–0.9)
MONO%: 3.9 % (ref 0.0–14.0)
NEUT#: 9.3 10*3/uL — ABNORMAL HIGH (ref 1.5–6.5)
NEUT%: 92.3 % — ABNORMAL HIGH (ref 38.4–76.8)
Platelets: 285 10*3/uL (ref 145–400)
RBC: 3.65 10*6/uL — ABNORMAL LOW (ref 3.70–5.45)
RDW: 14.8 % — ABNORMAL HIGH (ref 11.2–14.5)
WBC: 10 10*3/uL (ref 3.9–10.3)
lymph#: 0.4 10*3/uL — ABNORMAL LOW (ref 0.9–3.3)

## 2016-05-30 MED ORDER — ALPRAZOLAM 0.25 MG PO TABS: 0.2500 mg | ORAL_TABLET | Freq: Three times a day (TID) | ORAL | 0 refills | Status: DC | PRN

## 2016-05-30 MED ORDER — INFLUENZA VAC SPLIT QUAD 0.5 ML IM SUSY
0.5000 mL | PREFILLED_SYRINGE | Freq: Once | INTRAMUSCULAR | Status: AC
  Administered 2016-05-30: 0.5 mL via INTRAMUSCULAR
  Filled 2016-05-30: qty 0.5

## 2016-05-30 MED ORDER — SODIUM CHLORIDE 0.9 % IV SOLN
Freq: Once | INTRAVENOUS | Status: AC
  Administered 2016-05-30: 13:00:00 via INTRAVENOUS

## 2016-05-30 MED ORDER — DEXAMETHASONE SODIUM PHOSPHATE 100 MG/10ML IJ SOLN
Freq: Once | INTRAMUSCULAR | Status: AC
  Administered 2016-05-30: 13:00:00 via INTRAVENOUS
  Filled 2016-05-30: qty 4

## 2016-05-30 MED ORDER — SODIUM CHLORIDE 0.9 % IV SOLN
500.0000 mg/m2 | Freq: Once | INTRAVENOUS | Status: AC
  Administered 2016-05-30: 900 mg via INTRAVENOUS
  Filled 2016-05-30: qty 20

## 2016-05-30 NOTE — Patient Instructions (Signed)
[facility] Discharge Instructions for Patients Receiving Chemotherapy  Today you received the following chemotherapy agents: Avastin.  To help prevent nausea and vomiting after your treatment, we encourage you to take your nausea medication as directed. If you develop nausea and vomiting that is not controlled by your nausea medication, call the clinic.   BELOW ARE SYMPTOMS THAT SHOULD BE REPORTED IMMEDIATELY:  *FEVER GREATER THAN 100.5 F  *CHILLS WITH OR WITHOUT FEVER  NAUSEA AND VOMITING THAT IS NOT CONTROLLED WITH YOUR NAUSEA MEDICATION  *UNUSUAL SHORTNESS OF BREATH  *UNUSUAL BRUISING OR BLEEDING  TENDERNESS IN MOUTH AND THROAT WITH OR WITHOUT PRESENCE OF ULCERS  *URINARY PROBLEMS  *BOWEL PROBLEMS  UNUSUAL RASH Items with * indicate a potential emergency and should be followed up as soon as possible.  Feel free to call the clinic you have any questions or concerns. The clinic phone number is [phone].  Please show the [facility] at check-in to the Emergency Department and triage nurse.

## 2016-05-30 NOTE — Progress Notes (Signed)
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  PRIOR THERAPY:  1) Status post stereotactic radiotherapy to a solitary right parietal brain lesion under the care of Dr. [name] on 10/16/2013. 2) Status post palliative radiotherapy to the right lung tumor under the care of Dr. [name] completed on 12/05/2013. 3) Systemic chemotherapy with carboplatin for AUC of 5 and Alimta 500 mg/M2 every 3 weeks. First dose Jan 06 2014. Status post 6 cycles.  CURRENT THERAPY: Systemic chemotherapy with maintenance Alimta 500 mg/M2 every 3 weeks. Status post 34 cycles.  INTERVAL HISTORY: Alexis [name] 65 y.o. female returns to the clinic today for 3 weeks followup visit accompanied by her husband. The patient is feeling fine today and denied having any specific complaints. She is tolerating her maintenance chemotherapy with Alimta fairly well. She status post 34 cycles. She denied having any significant chest pain, shortness of breath, cough or hemoptysis. She has no fever or chills, no nausea or vomiting. She denied having any significant weight loss or night sweats. She is here today for evaluation before starting cycle #35.  MEDICAL HISTORY: Past Medical History:  Diagnosis Date  . Anxiety   . Cervical cancer ([employer])   . Encounter for antineoplastic chemotherapy 07/20/2015  . Malignant neoplasm of right upper lobe of lung (HCC)     non small cell lung cancer adenocarcioma with brain meta     ALLERGIES:  is allergic to codeine.  MEDICATIONS:  Current Outpatient Prescriptions  Medication Sig Dispense Refill  . acetaminophen (TYLENOL) 500 MG tablet Take 500 mg by mouth every 6 (six) hours as needed for mild pain or headache. Reported on 11/02/2015    . ALPRAZolam (XANAX) 0.25 MG tablet Take 1 tablet (0.25 mg total) by mouth 3 (three) times daily as needed. for anxiety 45 tablet 0  . Ascorbic Acid (VITAMIN C GUMMIE PO) Take 1 each by mouth every morning.    Marland Kitchen dexamethasone (DECADRON) 4 MG tablet 4 mg by mouth twice a day the day before, day of and day after the chemotherapy every 3 weeks 40 tablet 1  . folic acid (FOLVITE) 1 MG tablet TAKE 1 TABLET (1 MG TOTAL) BY MOUTH DAILY. 30 tablet 0  . Multiple Vitamin (MULTIVITAMIN WITH MINERALS) TABS tablet Take 1 tablet by mouth every morning.    Marland Kitchen omeprazole (PRILOSEC) 20 MG capsule Take 1 capsule (20 mg total) by mouth daily. As needed for reflux or indigestion. 42 capsule PRN  . ondansetron (ZOFRAN) 8 MG tablet Take 1 tablet (8 mg total) by mouth every 8 (eight) hours as needed for nausea or vomiting. 30 tablet 0  . OVER THE COUNTER MEDICATION Take 1 tablet by mouth every morning. (Vitamin A)    . pentoxifylline (TRENTAL) 400 MG CR tablet TAKE ONE TABLET BY MOUTH TWICE DAILY, ALSO  TAKE  VITAMIN  E  400  UNITS  TWICE  DAILY  WITH  PENTOXIFYLLINE 60 tablet 0  . senna-docusate (SENOKOT-S) 8.6-50 MG tablet Take 1 tablet  by mouth daily. 30 tablet prn  . Zinc 50 MG TABS Take 1 tablet by mouth every morning.    . prochlorperazine (COMPAZINE) 10 MG tablet Take 1 tablet (10 mg total) by mouth every 6 (six) hours as needed for nausea or vomiting. (Patient not taking: Reported on 05/30/2016) 60 tablet 0   No current facility-administered medications for this visit.     SURGICAL HISTORY:  Past Surgical History:  Procedure Laterality Date  . ABDOMINAL HYSTERECTOMY    . COLOSTOMY TAKEDOWN N/A 07/10/2014   Procedure: LAPAROSCOPIC LYSIS OF  ADHESIONS (90 MIN) LAPAROSCOPIC ASSISTED COLOSTOMY CLOSURE, RIGID PROCTOSIGMOIDOSCOPY;  Surgeon: Jackolyn Confer, MD;  Location: [facility];  Service: General;  Laterality: N/A;  . LAPAROTOMY N/A 11/03/2013   Procedure: EXPLORATORY LAPAROTOMY, DRAINAGE OF INTRA  ABDOMINAL ABSCESSES, MOBILIZATION OF SPLENIC FLEXURE, SIGMOID COLECTOMY WITH COLOSTOMY;  Surgeon: Odis Hollingshead, MD;  Location: [facility];  Service: General;  Laterality: N/A;  . VIDEO BRONCHOSCOPY Bilateral 08/30/2013   Procedure: VIDEO BRONCHOSCOPY WITH FLUORO;  Surgeon: Tanda Rockers, MD;  Location: [facility];  Service: Cardiopulmonary;  Laterality: Bilateral;    REVIEW OF SYSTEMS:  A comprehensive review of systems was negative.   PHYSICAL EXAMINATION: General appearance: alert, cooperative, fatigued and no distress Head: Normocephalic, without obvious abnormality, atraumatic Neck: no adenopathy, no JVD, supple, symmetrical, trachea midline and thyroid not enlarged, symmetric, no tenderness/mass/nodules Lymph nodes: Cervical, supraclavicular, and axillary nodes normal. Resp: clear to auscultation bilaterally Back: symmetric, no curvature. ROM normal. No CVA tenderness. Cardio: regular rate and rhythm, S1, S2 normal, no murmur, click, rub or gallop GI: soft, non-tender; bowel sounds normal; no masses,  no organomegaly Extremities: extremities normal, atraumatic, no cyanosis or edema Neurologic: Alert and oriented X 3, normal strength and tone. Normal symmetric reflexes. Normal coordination and gait  ECOG PERFORMANCE STATUS: 0 - Asymptomatic  Blood pressure (!) 112/54, pulse 65, temperature 97.9 F (36.6 C), temperature source Oral, resp. rate 17, height _0  (1.626 m), weight 165 lb 6.4 oz (75 kg), SpO2 100 %.  LABORATORY DATA: Lab Results  Component Value Date   WBC 10.0 05/30/2016   HGB 12.1 05/30/2016   HCT 36.7 05/30/2016   MCV 100.5 05/30/2016   PLT 285 05/30/2016      Chemistry      Component Value Date/Time   NA  141 05/30/2016 1038   K 4.0 05/30/2016 1038   CL 100 07/12/2014 0512   CO2 24 05/30/2016 1038   BUN 13.9 05/30/2016 1038   CREATININE 0.8 05/30/2016 1038      Component Value Date/Time   CALCIUM 9.4 05/30/2016 1038   ALKPHOS 99 05/30/2016 1038   AST 12 05/30/2016 1038   ALT 17 05/30/2016 1038   BILITOT 0.37 05/30/2016 1038       RADIOGRAPHIC STUDIES: No results found.  ASSESSMENT AND PLAN: This is a very pleasant 65 years old white female recently diagnosed with a stage IV non-small cell lung cancer, adenocarcinoma with negative EGFR mutation and negative ALK gene translocation. The patient is status post stereotactic radiotherapy to a solitary brain metastases in addition to palliative radiotherapy to the right upper lobe lung mass. She status post 6 cycles of systemic chemotherapy with carboplatin and Alimta with improvement in her disease and she is currently undergoing maintenance chemotherapy with single agent Alimta status post 34 cycles and tolerating her treatment fairly well. I recommended for her to continue her current maintenance treatment with single agent Alimta 500 mg/M2 every 3 weeks. She  will proceed with cycle #35 today. The patient would come back for follow-up visit in 3 weeks for evaluation before starting cycle #36After repeating CT scan of the chest, abdomen and pelvis for restaging of her disease. For anxiety, I gave the patient refill of Xanax. The patient was advised to call immediately if she has any concerning symptoms in the interval. The patient voices understanding of current disease status and treatment options and is in agreement with the current care plan.  All questions were answered. The patient knows to call the clinic with any problems, questions or concerns. We can certainly see the patient much sooner if necessary.  Disclaimer: This note was dictated with voice recognition software. Similar sounding words can inadvertently be transcribed and may  not be corrected upon review.

## 2016-05-30 NOTE — Telephone Encounter (Signed)
GAVE PATIENT AVS REPORT AND APPOINTMENTS FOR October THRU December. [facility] WILL CALL RE SCANS

## 2016-06-08 ENCOUNTER — Other Ambulatory Visit: Payer: Self-pay | Admitting: Internal Medicine

## 2016-06-08 DIAGNOSIS — C7931 Secondary malignant neoplasm of brain: Secondary | ICD-10-CM

## 2016-06-08 DIAGNOSIS — C7949 Secondary malignant neoplasm of other parts of nervous system: Secondary | ICD-10-CM

## 2016-06-08 DIAGNOSIS — C3411 Malignant neoplasm of upper lobe, right bronchus or lung: Secondary | ICD-10-CM

## 2016-06-09 ENCOUNTER — Other Ambulatory Visit: Payer: BLUE CROSS/BLUE SHIELD

## 2016-06-17 ENCOUNTER — Ambulatory Visit (HOSPITAL_COMMUNITY)
Admission: RE | Admit: 2016-06-17 | Discharge: 2016-06-17 | Disposition: A | Discharge status: Home / Self Care | Payer: BLUE CROSS/BLUE SHIELD | Source: Ambulatory Visit | Attending: Internal Medicine | Admitting: Internal Medicine

## 2016-06-17 ENCOUNTER — Encounter (HOSPITAL_COMMUNITY): Payer: Self-pay

## 2016-06-17 ENCOUNTER — Encounter: Payer: Self-pay | Admitting: Pharmacist

## 2016-06-17 DIAGNOSIS — F411 Generalized anxiety disorder: Secondary | ICD-10-CM | POA: Diagnosis not present

## 2016-06-17 DIAGNOSIS — I700 Atherosclerosis of aorta: Secondary | ICD-10-CM | POA: Diagnosis not present

## 2016-06-17 DIAGNOSIS — C7931 Secondary malignant neoplasm of brain: Secondary | ICD-10-CM | POA: Diagnosis not present

## 2016-06-17 DIAGNOSIS — C7949 Secondary malignant neoplasm of other parts of nervous system: Secondary | ICD-10-CM | POA: Diagnosis not present

## 2016-06-17 DIAGNOSIS — J439 Emphysema, unspecified: Secondary | ICD-10-CM | POA: Diagnosis not present

## 2016-06-17 DIAGNOSIS — C3411 Malignant neoplasm of upper lobe, right bronchus or lung: Secondary | ICD-10-CM | POA: Insufficient documentation

## 2016-06-17 MED ORDER — IOPAMIDOL (ISOVUE-300) INJECTION 61%
100.0000 mL | Freq: Once | INTRAVENOUS | Status: AC | PRN
  Administered 2016-06-17: 100 mL via INTRAVENOUS

## 2016-06-20 ENCOUNTER — Telehealth: Payer: Self-pay | Admitting: Internal Medicine

## 2016-06-20 ENCOUNTER — Other Ambulatory Visit (HOSPITAL_BASED_OUTPATIENT_CLINIC_OR_DEPARTMENT_OTHER): Payer: BLUE CROSS/BLUE SHIELD

## 2016-06-20 ENCOUNTER — Ambulatory Visit (HOSPITAL_BASED_OUTPATIENT_CLINIC_OR_DEPARTMENT_OTHER): Payer: BLUE CROSS/BLUE SHIELD | Admitting: Internal Medicine

## 2016-06-20 ENCOUNTER — Ambulatory Visit (HOSPITAL_BASED_OUTPATIENT_CLINIC_OR_DEPARTMENT_OTHER): Payer: BLUE CROSS/BLUE SHIELD

## 2016-06-20 ENCOUNTER — Encounter: Payer: Self-pay | Admitting: Internal Medicine

## 2016-06-20 VITALS — BP 118/52 | HR 89 | Temp 98.0°F | Resp 18 | Ht 64.0 in | Wt 165.1 lb

## 2016-06-20 DIAGNOSIS — Z5111 Encounter for antineoplastic chemotherapy: Secondary | ICD-10-CM

## 2016-06-20 DIAGNOSIS — C7949 Secondary malignant neoplasm of other parts of nervous system: Secondary | ICD-10-CM

## 2016-06-20 DIAGNOSIS — F419 Anxiety disorder, unspecified: Secondary | ICD-10-CM

## 2016-06-20 DIAGNOSIS — C3411 Malignant neoplasm of upper lobe, right bronchus or lung: Secondary | ICD-10-CM

## 2016-06-20 DIAGNOSIS — F411 Generalized anxiety disorder: Secondary | ICD-10-CM

## 2016-06-20 DIAGNOSIS — C7931 Secondary malignant neoplasm of brain: Secondary | ICD-10-CM | POA: Diagnosis not present

## 2016-06-20 HISTORY — DX: Anxiety disorder, unspecified: F41.9

## 2016-06-20 LAB — COMPREHENSIVE METABOLIC PANEL
ALT: 14 U/L (ref 0–55)
AST: 11 U/L (ref 5–34)
Albumin: 3.5 g/dL (ref 3.5–5.0)
Alkaline Phosphatase: 92 U/L (ref 40–150)
Anion Gap: 10 mEq/L (ref 3–11)
BUN: 13.1 mg/dL (ref 7.0–26.0)
CO2: 23 mEq/L (ref 22–29)
Calcium: 9.4 mg/dL (ref 8.4–10.4)
Chloride: 108 mEq/L (ref 98–109)
Creatinine: 0.8 mg/dL (ref 0.6–1.1)
EGFR: 81 mL/min/{1.73_m2} — ABNORMAL LOW (ref >90)
Glucose: 129 mg/dl (ref 70–140)
Potassium: 4.2 mEq/L (ref 3.5–5.1)
Sodium: 141 mEq/L (ref 136–145)
Total Bilirubin: 0.26 mg/dL (ref 0.20–1.20)
Total Protein: 7.2 g/dL (ref 6.4–8.3)

## 2016-06-20 LAB — CBC WITH DIFFERENTIAL/PLATELET
BASO%: 0 % (ref 0.0–2.0)
Basophils Absolute: 0 10*3/uL (ref 0.0–0.1)
EOS%: 0 % (ref 0.0–7.0)
Eosinophils Absolute: 0 10*3/uL (ref 0.0–0.5)
HCT: 36.9 % (ref 34.8–46.6)
HGB: 12.1 g/dL (ref 11.6–15.9)
LYMPH%: 3.2 % — ABNORMAL LOW (ref 14.0–49.7)
MCH: 32.8 pg (ref 25.1–34.0)
MCHC: 32.7 g/dL (ref 31.5–36.0)
MCV: 100.2 fL (ref 79.5–101.0)
MONO#: 0.7 10*3/uL (ref 0.1–0.9)
MONO%: 6.9 % (ref 0.0–14.0)
NEUT#: 8.7 10*3/uL — ABNORMAL HIGH (ref 1.5–6.5)
NEUT%: 89.9 % — ABNORMAL HIGH (ref 38.4–76.8)
Platelets: 305 10*3/uL (ref 145–400)
RBC: 3.69 10*6/uL — ABNORMAL LOW (ref 3.70–5.45)
RDW: 14.5 % (ref 11.2–14.5)
WBC: 9.7 10*3/uL (ref 3.9–10.3)
lymph#: 0.3 10*3/uL — ABNORMAL LOW (ref 0.9–3.3)

## 2016-06-20 MED ORDER — PEMETREXED DISODIUM CHEMO INJECTION 500 MG
500.0000 mg/m2 | Freq: Once | INTRAVENOUS | Status: AC
  Administered 2016-06-20: 900 mg via INTRAVENOUS
  Filled 2016-06-20: qty 16

## 2016-06-20 MED ORDER — DEXAMETHASONE SODIUM PHOSPHATE 10 MG/ML IJ SOLN
10.0000 mg | Freq: Once | INTRAMUSCULAR | Status: AC
  Administered 2016-06-20: 10 mg via INTRAVENOUS

## 2016-06-20 MED ORDER — SODIUM CHLORIDE 0.9 % IV SOLN
Freq: Once | INTRAVENOUS | Status: AC
  Administered 2016-06-20: 10:00:00 via INTRAVENOUS

## 2016-06-20 MED ORDER — CYANOCOBALAMIN 1000 MCG/ML IJ SOLN
INTRAMUSCULAR | Status: AC
  Filled 2016-06-20: qty 1

## 2016-06-20 MED ORDER — ONDANSETRON HCL 8 MG PO TABS
ORAL_TABLET | ORAL | Status: AC
  Filled 2016-06-20: qty 1

## 2016-06-20 MED ORDER — ONDANSETRON HCL 8 MG PO TABS
8.0000 mg | ORAL_TABLET | Freq: Once | ORAL | Status: AC
  Administered 2016-06-20: 8 mg via ORAL

## 2016-06-20 MED ORDER — CYANOCOBALAMIN 1000 MCG/ML IJ SOLN
1000.0000 ug | Freq: Once | INTRAMUSCULAR | Status: AC
  Administered 2016-06-20: 1000 ug via INTRAMUSCULAR

## 2016-06-20 MED ORDER — DEXAMETHASONE SODIUM PHOSPHATE 10 MG/ML IJ SOLN
INTRAMUSCULAR | Status: AC
  Filled 2016-06-20: qty 1

## 2016-06-20 NOTE — Progress Notes (Signed)
[facility] Telephone:([phone]   Fax:(336) [place], [place] [address] [place] [phone]  DIAGNOSIS: Stage IV (T2a, N0, M1b) non-small cell lung cancer of the right upper lobe consistent with adenocarcinoma with negative EGFR mutation and negative ALK gene translocation diagnosed in January of 2015 presented with right upper lobe lung mass in addition to a solitary brain metastasis status post stereotactic to a solitary brain metastasis as well as radiotherapy to the right upper lobe lung mass.  PRIOR THERAPY:  1) Status post stereotactic radiotherapy to a solitary right parietal brain lesion under the care of Dr. [name] on 10/16/2013. 2) Status post palliative radiotherapy to the right lung tumor under the care of Dr. [name] completed on 12/05/2013. 3) Systemic chemotherapy with carboplatin for AUC of 5 and Alimta 500 mg/M2 every 3 weeks. First dose Jan 06 2014. Status post 6 cycles.  CURRENT THERAPY: Systemic chemotherapy with maintenance Alimta 500 mg/M2 every 3 weeks. Status post 35 cycles.  INTERVAL HISTORY: Alexis [name] 65 y.o. female returns to the clinic today for 3 weeks followup visit accompanied by her husband. The patient has no complaints today. She is tolerating her maintenance chemotherapy with Alimta fairly well. She status post 35 cycles. She denied having any significant chest pain, shortness of breath, cough or hemoptysis. She has no fever or chills, no nausea or vomiting. She denied having any significant weight loss or night sweats. She had repeat CT scan of the chest, abdomen and pelvis performed recently and she is here for evaluation and discussion of her scan results.Marland Kitchen  MEDICAL HISTORY: Past Medical History:  Diagnosis Date  . Anxiety   . Cervical cancer ([place])   . Encounter for antineoplastic chemotherapy 07/20/2015  . Malignant neoplasm of right upper lobe of lung (HCC)     non small cell lung cancer  adenocarcioma with brain meta    ALLERGIES:  is allergic to codeine.  MEDICATIONS:  Current Outpatient Prescriptions  Medication Sig Dispense Refill  . acetaminophen (TYLENOL) 500 MG tablet Take 500 mg by mouth every 6 (six) hours as needed for mild pain or headache. Reported on 11/02/2015    . ALPRAZolam (XANAX) 0.25 MG tablet Take 1 tablet (0.25 mg total) by mouth 3 (three) times daily as needed. for anxiety 45 tablet 0  . Ascorbic Acid (VITAMIN C GUMMIE PO) Take 1 each by mouth every morning.    Marland Kitchen dexamethasone (DECADRON) 4 MG tablet TAKE 1 TABLET BY MOUTH TWICE A DAY THE DAY BEFORE, DAY OF AND DAY AFTER THE CHEMOTHERAPY EVERY 3 WKS 40 tablet 1  . Multiple Vitamin (MULTIVITAMIN WITH MINERALS) TABS tablet Take 1 tablet by mouth every morning.    Marland Kitchen omeprazole (PRILOSEC) 20 MG capsule Take 1 capsule (20 mg total) by mouth daily. As needed for reflux or indigestion. 42 capsule PRN  . ondansetron (ZOFRAN) 8 MG tablet Take 1 tablet (8 mg total) by mouth every 8 (eight) hours as needed for nausea or vomiting. 30 tablet 0  . OVER THE COUNTER MEDICATION Take 1 tablet by mouth every morning. (Vitamin A)    . pentoxifylline (TRENTAL) 400 MG CR tablet TAKE ONE TABLET BY MOUTH TWICE DAILY, ALSO  TAKE  VITAMIN  E  400  UNITS  TWICE  DAILY  WITH  PENTOXIFYLLINE 60 tablet 0  . prochlorperazine (COMPAZINE) 10 MG tablet Take 1 tablet (10 mg total) by mouth every 6 (six) hours as  needed for nausea or vomiting. 60 tablet 0  . senna-docusate (SENOKOT-S) 8.6-50 MG tablet Take 1 tablet by mouth daily. 30 tablet prn  . Zinc 50 MG TABS Take 1 tablet by mouth every morning.    . folic acid (FOLVITE) 1 MG tablet TAKE 1 TABLET (1 MG TOTAL) BY MOUTH DAILY. 30 tablet 0   No current facility-administered medications for this visit.     SURGICAL HISTORY:  Past Surgical History:  Procedure Laterality Date  . ABDOMINAL HYSTERECTOMY    . COLOSTOMY TAKEDOWN N/A 07/10/2014   Procedure: LAPAROSCOPIC LYSIS OF ADHESIONS (90  MIN) LAPAROSCOPIC ASSISTED COLOSTOMY CLOSURE, RIGID PROCTOSIGMOIDOSCOPY;  Surgeon: Jackolyn Confer, MD;  Location: [facility];  Service: General;  Laterality: N/A;  . LAPAROTOMY N/A 11/03/2013   Procedure: EXPLORATORY LAPAROTOMY, DRAINAGE OF INTRA  ABDOMINAL ABSCESSES, MOBILIZATION OF SPLENIC FLEXURE, SIGMOID COLECTOMY WITH COLOSTOMY;  Surgeon: Odis Hollingshead, MD;  Location: [facility];  Service: General;  Laterality: N/A;  . VIDEO BRONCHOSCOPY Bilateral 08/30/2013   Procedure: VIDEO BRONCHOSCOPY WITH FLUORO;  Surgeon: Tanda Rockers, MD;  Location: [facility];  Service: Cardiopulmonary;  Laterality: Bilateral;    REVIEW OF SYSTEMS:  Constitutional: negative Eyes: negative Ears, nose, mouth, throat, and face: negative Respiratory: negative Cardiovascular: negative Gastrointestinal: negative Genitourinary:negative Integument/breast: negative Hematologic/lymphatic: negative Musculoskeletal:negative Neurological: negative Behavioral/Psych: positive for anxiety Endocrine: negative Allergic/Immunologic: negative   PHYSICAL EXAMINATION: General appearance: alert, cooperative, fatigued and no distress Head: Normocephalic, without obvious abnormality, atraumatic Neck: no adenopathy, no JVD, supple, symmetrical, trachea midline and thyroid not enlarged, symmetric, no tenderness/mass/nodules Lymph nodes: Cervical, supraclavicular, and axillary nodes normal. Resp: clear to auscultation bilaterally Back: symmetric, no curvature. ROM normal. No CVA tenderness. Cardio: regular rate and rhythm, S1, S2 normal, no murmur, click, rub or gallop GI: soft, non-tender; bowel sounds normal; no masses,  no organomegaly Extremities: extremities normal, atraumatic, no cyanosis or edema Neurologic: Alert and oriented X 3, normal strength and tone. Normal symmetric reflexes. Normal coordination and gait  ECOG PERFORMANCE STATUS: 0 - Asymptomatic  There were no vitals taken for this visit.  LABORATORY DATA: Lab  Results  Component Value Date   WBC 9.7 06/20/2016   HGB 12.1 06/20/2016   HCT 36.9 06/20/2016   MCV 100.2 06/20/2016   PLT 305 06/20/2016      Chemistry      Component Value Date/Time   NA 141 05/30/2016 1038   K 4.0 05/30/2016 1038   CL 100 07/12/2014 0512   CO2 24 05/30/2016 1038   BUN 13.9 05/30/2016 1038   CREATININE 0.8 05/30/2016 1038      Component Value Date/Time   CALCIUM 9.4 05/30/2016 1038   ALKPHOS 99 05/30/2016 1038   AST 12 05/30/2016 1038   ALT 17 05/30/2016 1038   BILITOT 0.37 05/30/2016 1038       RADIOGRAPHIC STUDIES: Ct Chest W Contrast  Result Date: 06/17/2016 CLINICAL DATA:  Stage IV right upper lobe lung adenocarcinoma diagnosed January 2015 with solitary brain metastasis status post stereotactic radiation therapy and palate radiation therapy to the right upper lobe lung mass with ongoing chemotherapy, presenting for restaging. EXAM: CT CHEST, ABDOMEN, AND PELVIS WITH CONTRAST TECHNIQUE: Multidetector CT imaging of the chest, abdomen and pelvis was performed following the standard protocol during bolus administration of intravenous contrast. CONTRAST:  149m ISOVUE-300 IOPAMIDOL (ISOVUE-300) INJECTION 61% COMPARISON:  03/25/2016 CT chest, abdomen and pelvis. FINDINGS: CT CHEST FINDINGS Cardiovascular: Normal heart size. No significant pericardial fluid/thickening. Atherosclerotic nonaneurysmal thoracic aorta. Normal caliber pulmonary arteries.  No central pulmonary emboli. Mediastinum/Nodes: No discrete thyroid nodules. Unremarkable esophagus. No pathologically enlarged axillary, mediastinal or hilar lymph nodes. Lungs/Pleura: No pneumothorax. No pleural effusion. Mild centrilobular and paraseptal emphysema with diffuse bronchial wall thickening. Posterior apical right upper lobe 2.4 x 1.4 cm solid pulmonary nodule (series 5/ image 31), previously 2.4 x 1.4 cm, stable. Stable bandlike reticulation and ground-glass attenuation with associated volume loss and  distortion surrounding the apical right upper lobe pulmonary nodule, consistent with stable postradiation change. Anterior right lower lobe 3 mm (series 5/ image 86) and 2 mm (series 5/ image 78) pulmonary nodules are stable back to 08/19/2013 and considered benign. No acute consolidative airspace disease or new significant pulmonary nodules. Musculoskeletal: No aggressive appearing focal osseous lesions. Mild-to-moderate thoracic spondylosis. CT ABDOMEN PELVIS FINDINGS Hepatobiliary: Normal liver with no liver mass. Normal gallbladder with no radiopaque cholelithiasis. No biliary ductal dilatation. Pancreas: Normal, with no mass or duct dilation. Spleen: Normal size. No mass. Adrenals/Urinary Tract: Normal adrenals. No hydronephrosis. No renal mass . Normal bladder. Stomach/Bowel: Grossly normal stomach. Normal caliber small bowel with no small bowel wall thickening. Normal appendix. Stable subtotal distal colectomy with intact appearing colorectal anastomosis. Oral contrast progresses to the rectum. No large bowel wall thickening or pericolonic fat stranding. Vascular/Lymphatic: Atherosclerotic nonaneurysmal abdominal aorta. Patent portal, splenic, hepatic and renal veins. No pathologically enlarged lymph nodes in the abdomen or pelvis. Reproductive: Grossly normal uterus.  No adnexal mass. Other: No pneumoperitoneum, ascites or focal fluid collection. Musculoskeletal: No aggressive appearing focal osseous lesions. Mild lumbar spondylosis. IMPRESSION: 1. Stable treated primary apical right upper lobe pulmonary nodule with stable surrounding post radiation change. 2. No pathologically enlarged thoracic lymph nodes or other findings of metastatic disease in the chest. 3. No metastatic disease in the abdomen or pelvis. 4. Additional findings include aortic atherosclerosis and mild emphysema. Electronically Signed   By: Ilona Sorrel M.D.   On: 06/17/2016 11:48   Ct Abdomen Pelvis W Contrast  Result Date:  06/17/2016 CLINICAL DATA:  Stage IV right upper lobe lung adenocarcinoma diagnosed January 2015 with solitary brain metastasis status post stereotactic radiation therapy and palate radiation therapy to the right upper lobe lung mass with ongoing chemotherapy, presenting for restaging. EXAM: CT CHEST, ABDOMEN, AND PELVIS WITH CONTRAST TECHNIQUE: Multidetector CT imaging of the chest, abdomen and pelvis was performed following the standard protocol during bolus administration of intravenous contrast. CONTRAST:  142m ISOVUE-300 IOPAMIDOL (ISOVUE-300) INJECTION 61% COMPARISON:  03/25/2016 CT chest, abdomen and pelvis. FINDINGS: CT CHEST FINDINGS Cardiovascular: Normal heart size. No significant pericardial fluid/thickening. Atherosclerotic nonaneurysmal thoracic aorta. Normal caliber pulmonary arteries. No central pulmonary emboli. Mediastinum/Nodes: No discrete thyroid nodules. Unremarkable esophagus. No pathologically enlarged axillary, mediastinal or hilar lymph nodes. Lungs/Pleura: No pneumothorax. No pleural effusion. Mild centrilobular and paraseptal emphysema with diffuse bronchial wall thickening. Posterior apical right upper lobe 2.4 x 1.4 cm solid pulmonary nodule (series 5/ image 31), previously 2.4 x 1.4 cm, stable. Stable bandlike reticulation and ground-glass attenuation with associated volume loss and distortion surrounding the apical right upper lobe pulmonary nodule, consistent with stable postradiation change. Anterior right lower lobe 3 mm (series 5/ image 86) and 2 mm (series 5/ image 78) pulmonary nodules are stable back to 08/19/2013 and considered benign. No acute consolidative airspace disease or new significant pulmonary nodules. Musculoskeletal: No aggressive appearing focal osseous lesions. Mild-to-moderate thoracic spondylosis. CT ABDOMEN PELVIS FINDINGS Hepatobiliary: Normal liver with no liver mass. Normal gallbladder with no radiopaque cholelithiasis. No biliary ductal dilatation.  Pancreas:  Normal, with no mass or duct dilation. Spleen: Normal size. No mass. Adrenals/Urinary Tract: Normal adrenals. No hydronephrosis. No renal mass . Normal bladder. Stomach/Bowel: Grossly normal stomach. Normal caliber small bowel with no small bowel wall thickening. Normal appendix. Stable subtotal distal colectomy with intact appearing colorectal anastomosis. Oral contrast progresses to the rectum. No large bowel wall thickening or pericolonic fat stranding. Vascular/Lymphatic: Atherosclerotic nonaneurysmal abdominal aorta. Patent portal, splenic, hepatic and renal veins. No pathologically enlarged lymph nodes in the abdomen or pelvis. Reproductive: Grossly normal uterus.  No adnexal mass. Other: No pneumoperitoneum, ascites or focal fluid collection. Musculoskeletal: No aggressive appearing focal osseous lesions. Mild lumbar spondylosis. IMPRESSION: 1. Stable treated primary apical right upper lobe pulmonary nodule with stable surrounding post radiation change. 2. No pathologically enlarged thoracic lymph nodes or other findings of metastatic disease in the chest. 3. No metastatic disease in the abdomen or pelvis. 4. Additional findings include aortic atherosclerosis and mild emphysema. Electronically Signed   By: Ilona Sorrel M.D.   On: 06/17/2016 11:48    ASSESSMENT AND PLAN: This is a very pleasant 65 years old white female recently diagnosed with a stage IV non-small cell lung cancer, adenocarcinoma with negative EGFR mutation and negative ALK gene translocation. The patient is status post stereotactic radiotherapy to a solitary brain metastases in addition to palliative radiotherapy to the right upper lobe lung mass. She status post 6 cycles of systemic chemotherapy with carboplatin and Alimta with improvement in her disease and she is currently undergoing maintenance chemotherapy with single agent Alimta status post 35 cycles and tolerating her treatment fairly well. Recent CT scan of the chest,  abdomen and pelvis showed no evidence for disease progression. I discussed the scan results with the patient and her husband. I recommended for her to continue her maintenance systemic chemotherapy with single agent Alimta and she will receive cycle #36 today. The patient would come back for follow-up visit in 3 weeks for evaluation before starting cycle #37. For anxiety, she will continue on Xanax. The patient was advised to call immediately if she has any concerning symptoms in the interval. The patient voices understanding of current disease status and treatment options and is in agreement with the current care plan.  All questions were answered. The patient knows to call the clinic with any problems, questions or concerns. We can certainly see the patient much sooner if necessary.  Disclaimer: This note was dictated with voice recognition software. Similar sounding words can inadvertently be transcribed and may not be corrected upon review.

## 2016-06-20 NOTE — Patient Instructions (Signed)
[facility] Discharge Instructions for Patients Receiving Chemotherapy  Today you received the following chemotherapy agents Alimta.  To help prevent nausea and vomiting after your treatment, we encourage you to take your nausea medication as directed.   If you develop nausea and vomiting that is not controlled by your nausea medication, call the clinic.   BELOW ARE SYMPTOMS THAT SHOULD BE REPORTED IMMEDIATELY:  *FEVER GREATER THAN 100.5 F  *CHILLS WITH OR WITHOUT FEVER  NAUSEA AND VOMITING THAT IS NOT CONTROLLED WITH YOUR NAUSEA MEDICATION  *UNUSUAL SHORTNESS OF BREATH  *UNUSUAL BRUISING OR BLEEDING  TENDERNESS IN MOUTH AND THROAT WITH OR WITHOUT PRESENCE OF ULCERS  *URINARY PROBLEMS  *BOWEL PROBLEMS  UNUSUAL RASH Items with * indicate a potential emergency and should be followed up as soon as possible.  Feel free to call the clinic you have any questions or concerns. The clinic phone number is [phone].  Please show the [facility] at check-in to the Emergency Department and triage nurse.  Cyanocobalamin, Vitamin B12 injection What is this medicine? CYANOCOBALAMIN (sye an oh koe BAL a min) is a man made form of vitamin B12. Vitamin B12 is used in the growth of healthy blood cells, nerve cells, and proteins in the body. It also helps with the metabolism of fats and carbohydrates. This medicine is used to treat people who can not absorb vitamin B12. This medicine may be used for other purposes; ask your health care provider or pharmacist if you have questions. What should I tell my health care provider before I take this medicine? They need to know if you have any of these conditions: -kidney disease -Leber's disease -megaloblastic anemia -an unusual or allergic reaction to cyanocobalamin, cobalt, other medicines, foods, dyes, or preservatives -pregnant or trying to get pregnant -breast-feeding How should I use this medicine? This medicine is  injected into a muscle or deeply under the skin. It is usually given by a health care professional in a clinic or doctor's office. However, your doctor may teach you how to inject yourself. Follow all instructions. Talk to your pediatrician regarding the use of this medicine in children. Special care may be needed. Overdosage: If you think you have taken too much of this medicine contact a poison control center or emergency room at once. NOTE: This medicine is only for you. Do not share this medicine with others. What if I miss a dose? If you are given your dose at a clinic or doctor's office, call to reschedule your appointment. If you give your own injections and you miss a dose, take it as soon as you can. If it is almost time for your next dose, take only that dose. Do not take double or extra doses. What may interact with this medicine? -colchicine -heavy alcohol intake This list may not describe all possible interactions. Give your health care provider a list of all the medicines, herbs, non-prescription drugs, or dietary supplements you use. Also tell them if you smoke, drink alcohol, or use illegal drugs. Some items may interact with your medicine. What should I watch for while using this medicine? Visit your doctor or health care professional regularly. You may need blood work done while you are taking this medicine. You may need to follow a special diet. Talk to your doctor. Limit your alcohol intake and avoid smoking to get the best benefit. What side effects may I notice from receiving this medicine? Side effects that you should report to your doctor  or health care professional as soon as possible: -allergic reactions like skin rash, itching or hives, swelling of the face, lips, or tongue -blue tint to skin -chest tightness, pain -difficulty breathing, wheezing -dizziness -red, swollen painful area on the leg Side effects that usually do not require medical attention (report to your  doctor or health care professional if they continue or are bothersome): -diarrhea -headache This list may not describe all possible side effects. Call your doctor for medical advice about side effects. You may report side effects to FDA at 1-800-FDA-1088. Where should I keep my medicine? Keep out of the reach of children. Store at room temperature between 15 and 30 degrees C (59 and 85 degrees F). Protect from light. Throw away any unused medicine after the expiration date. NOTE: This sheet is a summary. It may not cover all possible information. If you have questions about this medicine, talk to your doctor, pharmacist, or health care provider.    2016, [employer]vier/Gold Standard. (2007-11-26 22:10:20)

## 2016-06-20 NOTE — Telephone Encounter (Signed)
AVS report and appointment schedule, given to patient per 06/20/16 los.

## 2016-06-23 ENCOUNTER — Other Ambulatory Visit: Payer: Self-pay | Admitting: Internal Medicine

## 2016-06-24 ENCOUNTER — Telehealth: Payer: Self-pay | Admitting: Radiation Therapy

## 2016-06-24 NOTE — Telephone Encounter (Signed)
Opened in error

## 2016-06-27 ENCOUNTER — Other Ambulatory Visit: Payer: Self-pay | Admitting: Medical Oncology

## 2016-06-27 DIAGNOSIS — C3490 Malignant neoplasm of unspecified part of unspecified bronchus or lung: Secondary | ICD-10-CM

## 2016-06-27 MED ORDER — FOLIC ACID 1 MG PO TABS: 1.0000 mg | ORAL_TABLET | Freq: Every day | ORAL | 0 refills | Status: DC

## 2016-07-07 ENCOUNTER — Ambulatory Visit
Admission: RE | Admit: 2016-07-07 | Discharge: 2016-07-07 | Disposition: A | Discharge status: Home / Self Care | Payer: BLUE CROSS/BLUE SHIELD | Source: Ambulatory Visit | Attending: Radiation Oncology | Admitting: Radiation Oncology

## 2016-07-07 DIAGNOSIS — C7949 Secondary malignant neoplasm of other parts of nervous system: Principal | ICD-10-CM

## 2016-07-07 DIAGNOSIS — C7931 Secondary malignant neoplasm of brain: Secondary | ICD-10-CM

## 2016-07-07 MED ORDER — GADOBENATE DIMEGLUMINE 529 MG/ML IV SOLN
15.0000 mL | Freq: Once | INTRAVENOUS | Status: AC | PRN
  Administered 2016-07-07: 15 mL via INTRAVENOUS

## 2016-07-11 ENCOUNTER — Telehealth: Payer: Self-pay | Admitting: *Deleted

## 2016-07-11 ENCOUNTER — Ambulatory Visit
Admission: RE | Admit: 2016-07-11 | Discharge: 2016-07-11 | Disposition: A | Discharge status: Home / Self Care | Payer: BLUE CROSS/BLUE SHIELD | Source: Ambulatory Visit | Attending: Radiation Oncology | Admitting: Radiation Oncology

## 2016-07-11 ENCOUNTER — Ambulatory Visit (HOSPITAL_BASED_OUTPATIENT_CLINIC_OR_DEPARTMENT_OTHER): Payer: BLUE CROSS/BLUE SHIELD

## 2016-07-11 ENCOUNTER — Telehealth: Payer: Self-pay | Admitting: Internal Medicine

## 2016-07-11 ENCOUNTER — Encounter: Payer: Self-pay | Admitting: Radiation Oncology

## 2016-07-11 ENCOUNTER — Other Ambulatory Visit (HOSPITAL_BASED_OUTPATIENT_CLINIC_OR_DEPARTMENT_OTHER): Payer: BLUE CROSS/BLUE SHIELD

## 2016-07-11 ENCOUNTER — Encounter: Payer: Self-pay | Admitting: Internal Medicine

## 2016-07-11 ENCOUNTER — Ambulatory Visit (HOSPITAL_BASED_OUTPATIENT_CLINIC_OR_DEPARTMENT_OTHER): Payer: BLUE CROSS/BLUE SHIELD | Admitting: Internal Medicine

## 2016-07-11 VITALS — BP 104/54 | HR 76 | Temp 98.4°F | Resp 18 | Ht 64.0 in | Wt 162.8 lb

## 2016-07-11 DIAGNOSIS — Z90710 Acquired absence of both cervix and uterus: Secondary | ICD-10-CM | POA: Insufficient documentation

## 2016-07-11 DIAGNOSIS — Z885 Allergy status to narcotic agent status: Secondary | ICD-10-CM | POA: Diagnosis not present

## 2016-07-11 DIAGNOSIS — Z809 Family history of malignant neoplasm, unspecified: Secondary | ICD-10-CM | POA: Diagnosis not present

## 2016-07-11 DIAGNOSIS — C3411 Malignant neoplasm of upper lobe, right bronchus or lung: Secondary | ICD-10-CM | POA: Diagnosis not present

## 2016-07-11 DIAGNOSIS — F419 Anxiety disorder, unspecified: Secondary | ICD-10-CM | POA: Insufficient documentation

## 2016-07-11 DIAGNOSIS — Z9049 Acquired absence of other specified parts of digestive tract: Secondary | ICD-10-CM | POA: Insufficient documentation

## 2016-07-11 DIAGNOSIS — Z5111 Encounter for antineoplastic chemotherapy: Secondary | ICD-10-CM

## 2016-07-11 DIAGNOSIS — Z87891 Personal history of nicotine dependence: Secondary | ICD-10-CM | POA: Diagnosis not present

## 2016-07-11 DIAGNOSIS — Z836 Family history of other diseases of the respiratory system: Secondary | ICD-10-CM | POA: Diagnosis not present

## 2016-07-11 DIAGNOSIS — C7931 Secondary malignant neoplasm of brain: Secondary | ICD-10-CM

## 2016-07-11 DIAGNOSIS — Z8249 Family history of ischemic heart disease and other diseases of the circulatory system: Secondary | ICD-10-CM | POA: Insufficient documentation

## 2016-07-11 DIAGNOSIS — C7949 Secondary malignant neoplasm of other parts of nervous system: Secondary | ICD-10-CM

## 2016-07-11 DIAGNOSIS — Z9221 Personal history of antineoplastic chemotherapy: Secondary | ICD-10-CM | POA: Insufficient documentation

## 2016-07-11 DIAGNOSIS — Z8541 Personal history of malignant neoplasm of cervix uteri: Secondary | ICD-10-CM | POA: Insufficient documentation

## 2016-07-11 DIAGNOSIS — F411 Generalized anxiety disorder: Secondary | ICD-10-CM

## 2016-07-11 DIAGNOSIS — Z801 Family history of malignant neoplasm of trachea, bronchus and lung: Secondary | ICD-10-CM | POA: Diagnosis not present

## 2016-07-11 LAB — COMPREHENSIVE METABOLIC PANEL
ALT: 13 U/L (ref 0–55)
AST: 11 U/L (ref 5–34)
Albumin: 3.5 g/dL (ref 3.5–5.0)
Alkaline Phosphatase: 102 U/L (ref 40–150)
Anion Gap: 12 mEq/L — ABNORMAL HIGH (ref 3–11)
BUN: 12.9 mg/dL (ref 7.0–26.0)
CO2: 22 mEq/L (ref 22–29)
Calcium: 9.6 mg/dL (ref 8.4–10.4)
Chloride: 107 mEq/L (ref 98–109)
Creatinine: 0.8 mg/dL (ref 0.6–1.1)
EGFR: 80 mL/min/{1.73_m2} — ABNORMAL LOW (ref >90)
Glucose: 128 mg/dl (ref 70–140)
Potassium: 4 mEq/L (ref 3.5–5.1)
Sodium: 141 mEq/L (ref 136–145)
Total Bilirubin: 0.36 mg/dL (ref 0.20–1.20)
Total Protein: 7.3 g/dL (ref 6.4–8.3)

## 2016-07-11 LAB — CBC WITH DIFFERENTIAL/PLATELET
BASO%: 0 % (ref 0.0–2.0)
Basophils Absolute: 0 10*3/uL (ref 0.0–0.1)
EOS%: 0 % (ref 0.0–7.0)
Eosinophils Absolute: 0 10*3/uL (ref 0.0–0.5)
HCT: 37.5 % (ref 34.8–46.6)
HGB: 12.3 g/dL (ref 11.6–15.9)
LYMPH%: 3.7 % — ABNORMAL LOW (ref 14.0–49.7)
MCH: 32.7 pg (ref 25.1–34.0)
MCHC: 32.8 g/dL (ref 31.5–36.0)
MCV: 99.7 fL (ref 79.5–101.0)
MONO#: 0.4 10*3/uL (ref 0.1–0.9)
MONO%: 4.1 % (ref 0.0–14.0)
NEUT#: 9.5 10*3/uL — ABNORMAL HIGH (ref 1.5–6.5)
NEUT%: 92.2 % — ABNORMAL HIGH (ref 38.4–76.8)
Platelets: 284 10*3/uL (ref 145–400)
RBC: 3.76 10*6/uL (ref 3.70–5.45)
RDW: 14.3 % (ref 11.2–14.5)
WBC: 10.3 10*3/uL (ref 3.9–10.3)
lymph#: 0.4 10*3/uL — ABNORMAL LOW (ref 0.9–3.3)

## 2016-07-11 MED ORDER — DEXAMETHASONE SODIUM PHOSPHATE 10 MG/ML IJ SOLN
10.0000 mg | Freq: Once | INTRAMUSCULAR | Status: AC
  Administered 2016-07-11: 10 mg via INTRAVENOUS

## 2016-07-11 MED ORDER — SODIUM CHLORIDE 0.9 % IV SOLN
500.0000 mg/m2 | Freq: Once | INTRAVENOUS | Status: AC
  Administered 2016-07-11: 900 mg via INTRAVENOUS
  Filled 2016-07-11: qty 20

## 2016-07-11 MED ORDER — ONDANSETRON HCL 8 MG PO TABS
8.0000 mg | ORAL_TABLET | Freq: Once | ORAL | Status: AC
  Administered 2016-07-11: 8 mg via ORAL

## 2016-07-11 MED ORDER — DEXAMETHASONE SODIUM PHOSPHATE 10 MG/ML IJ SOLN
INTRAMUSCULAR | Status: AC
  Filled 2016-07-11: qty 1

## 2016-07-11 MED ORDER — ALPRAZOLAM 0.25 MG PO TABS: 0.2500 mg | ORAL_TABLET | Freq: Three times a day (TID) | ORAL | 0 refills | Status: DC | PRN

## 2016-07-11 MED ORDER — ONDANSETRON HCL 8 MG PO TABS
ORAL_TABLET | ORAL | Status: AC
  Filled 2016-07-11: qty 1

## 2016-07-11 MED ORDER — SODIUM CHLORIDE 0.9 % IV SOLN
Freq: Once | INTRAVENOUS | Status: AC
  Administered 2016-07-11: 13:00:00 via INTRAVENOUS

## 2016-07-11 NOTE — Progress Notes (Signed)
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  PRIOR THERAPY:  1) Status post stereotactic radiotherapy to a solitary right parietal brain lesion under the care of Dr. [name] on 10/16/2013. 2) Status post palliative radiotherapy to the right lung tumor under the care of Dr. [name] completed on 12/05/2013. 3) Systemic chemotherapy with carboplatin for AUC of 5 and Alimta 500 mg/M2 every 3 weeks. First dose Jan 06 2014. Status post 6 cycles.  CURRENT THERAPY: Systemic chemotherapy with maintenance Alimta 500 mg/M2 every 3 weeks. Status post 36 cycles.  INTERVAL HISTORY: Alexis [name] 65 y.o. female returns to the clinic today for 3 weeks followup visit accompanied by her husband. The patient has no complaints today. She is tolerating her maintenance chemotherapy with Alimta fairly well. She status post 36 cycles. She denied having any significant chest pain, shortness of breath, cough or hemoptysis. She has no fever or chills, no nausea or vomiting. She denied having any significant weight loss or night sweats. She is requesting refill of Xanax. She is here today to start cycle #37 of her treatment.  MEDICAL HISTORY: Past Medical History:  Diagnosis Date  . Anxiety   . Anxiety 06/20/2016  . Cervical cancer ([facility])   . Encounter for antineoplastic chemotherapy 07/20/2015  . 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  MEDICATIONS:  Current Outpatient Prescriptions  Medication Sig Dispense Refill  . acetaminophen (TYLENOL) 500 MG tablet Take 500 mg by mouth every 6 (six) hours as needed for mild pain or headache. Reported on 11/02/2015    . ALPRAZolam (XANAX) 0.25 MG tablet Take 1 tablet (0.25 mg total) by mouth 3 (three) times daily as needed. for anxiety 45 tablet 0  . Ascorbic Acid (VITAMIN C GUMMIE PO) Take 1 each by mouth every morning.    Marland Kitchen dexamethasone (DECADRON) 4 MG tablet TAKE 1 TABLET BY MOUTH TWICE A DAY THE DAY BEFORE, DAY OF AND DAY AFTER THE CHEMOTHERAPY EVERY 3 WKS 40 tablet 1  . folic acid (FOLVITE) 1 MG tablet Take 1 tablet (1 mg total) by mouth daily. 90 tablet 0  . Multiple Vitamin (MULTIVITAMIN WITH MINERALS) TABS tablet Take 1 tablet by mouth every morning.    Marland Kitchen omeprazole (PRILOSEC) 20 MG capsule Take 1 capsule (20 mg total) by mouth daily. As needed for reflux or indigestion. 42 capsule PRN  . OVER THE COUNTER MEDICATION Take 1 tablet by mouth every morning. (Vitamin A)    . pentoxifylline (TRENTAL) 400 MG CR tablet TAKE ONE TABLET BY MOUTH TWICE DAILY, ALSO  TAKE  VITAMIN  E  400  UNITS  TWICE  DAILY  WITH  PENTOXIFYLLINE 60 tablet 0  . senna-docusate (SENOKOT-S) 8.6-50 MG tablet Take 1 tablet by mouth daily. 30 tablet prn  . ondansetron (ZOFRAN) 8 MG tablet Take 1 tablet (8 mg total) by mouth  every 8 (eight) hours as needed for nausea or vomiting. (Patient not taking: Reported on 07/11/2016) 30 tablet 0  . prochlorperazine (COMPAZINE) 10 MG tablet Take 1 tablet (10 mg total) by mouth every 6 (six) hours as needed for nausea or vomiting. (Patient not taking: Reported on 07/11/2016) 60 tablet 0   No current facility-administered medications for this visit.     SURGICAL HISTORY:  Past Surgical History:  Procedure Laterality Date  . ABDOMINAL HYSTERECTOMY    . COLOSTOMY TAKEDOWN N/A 07/10/2014   Procedure: LAPAROSCOPIC LYSIS OF ADHESIONS (90  MIN) LAPAROSCOPIC ASSISTED COLOSTOMY CLOSURE, RIGID PROCTOSIGMOIDOSCOPY;  Surgeon: Jackolyn Confer, MD;  Location: [facility];  Service: General;  Laterality: N/A;  . LAPAROTOMY N/A 11/03/2013   Procedure: EXPLORATORY LAPAROTOMY, DRAINAGE OF INTRA  ABDOMINAL ABSCESSES, MOBILIZATION OF SPLENIC FLEXURE, SIGMOID COLECTOMY WITH COLOSTOMY;  Surgeon: Odis Hollingshead, MD;  Location: [facility];  Service: General;  Laterality: N/A;  . VIDEO BRONCHOSCOPY Bilateral 08/30/2013   Procedure: VIDEO BRONCHOSCOPY WITH FLUORO;  Surgeon: Tanda Rockers, MD;  Location: [facility];  Service: Cardiopulmonary;  Laterality: Bilateral;    REVIEW OF SYSTEMS:  A comprehensive review of systems was negative except for: Behavioral/Psych: positive for anxiety   PHYSICAL EXAMINATION: General appearance: alert, cooperative, fatigued and no distress Head: Normocephalic, without obvious abnormality, atraumatic Neck: no adenopathy, no JVD, supple, symmetrical, trachea midline and thyroid not enlarged, symmetric, no tenderness/mass/nodules Lymph nodes: Cervical, supraclavicular, and axillary nodes normal. Resp: clear to auscultation bilaterally Back: symmetric, no curvature. ROM normal. No CVA tenderness. Cardio: regular rate and rhythm, S1, S2 normal, no murmur, click, rub or gallop GI: soft, non-tender; bowel sounds normal; no masses,  no organomegaly Extremities: extremities normal, atraumatic, no cyanosis or edema Neurologic: Alert and oriented X 3, normal strength and tone. Normal symmetric reflexes. Normal coordination and gait  ECOG PERFORMANCE STATUS: 0 - Asymptomatic  Blood pressure 110/68, pulse 76, temperature 98.4 F (36.9 C), temperature source Oral, resp. rate 18, height _0  (1.626 m), weight 162 lb (73.5 kg), SpO2 100 %.  LABORATORY DATA: Lab Results  Component Value Date   WBC 10.3 07/11/2016   HGB 12.3 07/11/2016   HCT 37.5 07/11/2016   MCV 99.7 07/11/2016   PLT 284 07/11/2016      Chemistry        Component Value Date/Time   NA 141 07/11/2016 1114   K 4.0 07/11/2016 1114   CL 100 07/12/2014 0512   CO2 22 07/11/2016 1114   BUN 12.9 07/11/2016 1114   CREATININE 0.8 07/11/2016 1114      Component Value Date/Time   CALCIUM 9.6 07/11/2016 1114   ALKPHOS 102 07/11/2016 1114   AST 11 07/11/2016 1114   ALT 13 07/11/2016 1114   BILITOT 0.36 07/11/2016 1114       RADIOGRAPHIC STUDIES: Ct Chest W Contrast  Result Date: 06/17/2016 CLINICAL DATA:  Stage IV right upper lobe lung adenocarcinoma diagnosed January 2015 with solitary brain metastasis status post stereotactic radiation therapy and palate radiation therapy to the right upper lobe lung mass with ongoing chemotherapy, presenting for restaging. EXAM: CT CHEST, ABDOMEN, AND PELVIS WITH CONTRAST TECHNIQUE: Multidetector CT imaging of the chest, abdomen and pelvis was performed following the standard protocol during bolus administration of intravenous contrast. CONTRAST:  150m ISOVUE-300 IOPAMIDOL (ISOVUE-300) INJECTION 61% COMPARISON:  03/25/2016 CT chest, abdomen and pelvis. FINDINGS: CT CHEST FINDINGS Cardiovascular: Normal heart size. No significant pericardial fluid/thickening. Atherosclerotic nonaneurysmal thoracic aorta. Normal caliber pulmonary arteries. No central pulmonary emboli.  Mediastinum/Nodes: No discrete thyroid nodules. Unremarkable esophagus. No pathologically enlarged axillary, mediastinal or hilar lymph nodes. Lungs/Pleura: No pneumothorax. No pleural effusion. Mild centrilobular and paraseptal emphysema with diffuse bronchial wall thickening. Posterior apical right upper lobe 2.4 x 1.4 cm solid pulmonary nodule (series 5/ image 31), previously 2.4 x 1.4 cm, stable. Stable bandlike reticulation and ground-glass attenuation with associated volume loss and distortion surrounding the apical right upper lobe pulmonary nodule, consistent with stable postradiation change. Anterior right lower lobe 3 mm (series 5/ image 86) and 2  mm (series 5/ image 78) pulmonary nodules are stable back to 08/19/2013 and considered benign. No acute consolidative airspace disease or new significant pulmonary nodules. Musculoskeletal: No aggressive appearing focal osseous lesions. Mild-to-moderate thoracic spondylosis. CT ABDOMEN PELVIS FINDINGS Hepatobiliary: Normal liver with no liver mass. Normal gallbladder with no radiopaque cholelithiasis. No biliary ductal dilatation. Pancreas: Normal, with no mass or duct dilation. Spleen: Normal size. No mass. Adrenals/Urinary Tract: Normal adrenals. No hydronephrosis. No renal mass . Normal bladder. Stomach/Bowel: Grossly normal stomach. Normal caliber small bowel with no small bowel wall thickening. Normal appendix. Stable subtotal distal colectomy with intact appearing colorectal anastomosis. Oral contrast progresses to the rectum. No large bowel wall thickening or pericolonic fat stranding. Vascular/Lymphatic: Atherosclerotic nonaneurysmal abdominal aorta. Patent portal, splenic, hepatic and renal veins. No pathologically enlarged lymph nodes in the abdomen or pelvis. Reproductive: Grossly normal uterus.  No adnexal mass. Other: No pneumoperitoneum, ascites or focal fluid collection. Musculoskeletal: No aggressive appearing focal osseous lesions. Mild lumbar spondylosis. IMPRESSION: 1. Stable treated primary apical right upper lobe pulmonary nodule with stable surrounding post radiation change. 2. No pathologically enlarged thoracic lymph nodes or other findings of metastatic disease in the chest. 3. No metastatic disease in the abdomen or pelvis. 4. Additional findings include aortic atherosclerosis and mild emphysema. Electronically Signed   By: Ilona Sorrel M.D.   On: 06/17/2016 11:48   Mr [name] XL Contrast  Result Date: 07/07/2016 CLINICAL DATA:  Metastatic lung cancer status post radio surgery. EXAM: MRI HEAD WITHOUT AND WITH CONTRAST TECHNIQUE: Multiplanar, multiecho pulse sequences of the brain and  surrounding structures were obtained without and with intravenous contrast. CONTRAST:  15 mL MultiHance COMPARISON:  03/04/2016 FINDINGS: Brain: There is no evidence of acute infarct, midline shift, or extra-axial fluid collection. Ventricles and sulci are normal for age. Irregular, peripherally enhancing right parietal lesion is unchanged in size, measuring 16 x 10 mm (series 10, image 109). Surrounding white matter T2 hyperintensity has slightly increased. No new enhancing brain lesions are identified. A small right frontal lobe developmental venous anomaly is again noted. Patchy T2 hyperintensities in the cerebral white matter bilaterally are unchanged and compatible with chronic small vessel ischemic disease. Vascular: Major intracranial vascular flow voids are preserved. Skull and upper cervical spine: No suspicious osseous lesion identified. Sinuses/Orbits: Unremarkable orbits. Mild left ethmoid air cell mucosal thickening. Clear mastoid air cells. Other: A 12 mm nonenhancing nodule is again seen in the left parietal scalp, benign in appearance. IMPRESSION: 1. Unchanged size of treated right parietal lobe lesion. Slightly increased surrounding edema. 2. No evidence of new intracranial metastases. Electronically Signed   By: Logan Bores M.D.   On: 07/07/2016 13:56   Ct Abdomen Pelvis W Contrast  Result Date: 06/17/2016 CLINICAL DATA:  Stage IV right upper lobe lung adenocarcinoma diagnosed January 2015 with solitary brain metastasis status post stereotactic radiation therapy and palate radiation therapy to the right upper lobe lung mass with ongoing chemotherapy, presenting for  restaging. EXAM: CT CHEST, ABDOMEN, AND PELVIS WITH CONTRAST TECHNIQUE: Multidetector CT imaging of the chest, abdomen and pelvis was performed following the standard protocol during bolus administration of intravenous contrast. CONTRAST:  110m ISOVUE-300 IOPAMIDOL (ISOVUE-300) INJECTION 61% COMPARISON:  03/25/2016 CT chest,  abdomen and pelvis. FINDINGS: CT CHEST FINDINGS Cardiovascular: Normal heart size. No significant pericardial fluid/thickening. Atherosclerotic nonaneurysmal thoracic aorta. Normal caliber pulmonary arteries. No central pulmonary emboli. Mediastinum/Nodes: No discrete thyroid nodules. Unremarkable esophagus. No pathologically enlarged axillary, mediastinal or hilar lymph nodes. Lungs/Pleura: No pneumothorax. No pleural effusion. Mild centrilobular and paraseptal emphysema with diffuse bronchial wall thickening. Posterior apical right upper lobe 2.4 x 1.4 cm solid pulmonary nodule (series 5/ image 31), previously 2.4 x 1.4 cm, stable. Stable bandlike reticulation and ground-glass attenuation with associated volume loss and distortion surrounding the apical right upper lobe pulmonary nodule, consistent with stable postradiation change. Anterior right lower lobe 3 mm (series 5/ image 86) and 2 mm (series 5/ image 78) pulmonary nodules are stable back to 08/19/2013 and considered benign. No acute consolidative airspace disease or new significant pulmonary nodules. Musculoskeletal: No aggressive appearing focal osseous lesions. Mild-to-moderate thoracic spondylosis. CT ABDOMEN PELVIS FINDINGS Hepatobiliary: Normal liver with no liver mass. Normal gallbladder with no radiopaque cholelithiasis. No biliary ductal dilatation. Pancreas: Normal, with no mass or duct dilation. Spleen: Normal size. No mass. Adrenals/Urinary Tract: Normal adrenals. No hydronephrosis. No renal mass . Normal bladder. Stomach/Bowel: Grossly normal stomach. Normal caliber small bowel with no small bowel wall thickening. Normal appendix. Stable subtotal distal colectomy with intact appearing colorectal anastomosis. Oral contrast progresses to the rectum. No large bowel wall thickening or pericolonic fat stranding. Vascular/Lymphatic: Atherosclerotic nonaneurysmal abdominal aorta. Patent portal, splenic, hepatic and renal veins. No pathologically  enlarged lymph nodes in the abdomen or pelvis. Reproductive: Grossly normal uterus.  No adnexal mass. Other: No pneumoperitoneum, ascites or focal fluid collection. Musculoskeletal: No aggressive appearing focal osseous lesions. Mild lumbar spondylosis. IMPRESSION: 1. Stable treated primary apical right upper lobe pulmonary nodule with stable surrounding post radiation change. 2. No pathologically enlarged thoracic lymph nodes or other findings of metastatic disease in the chest. 3. No metastatic disease in the abdomen or pelvis. 4. Additional findings include aortic atherosclerosis and mild emphysema. Electronically Signed   By: JIlona SorrelM.D.   On: 06/17/2016 11:48    ASSESSMENT AND PLAN: This is a very pleasant 65years old white female recently diagnosed with a stage IV non-small cell lung cancer, adenocarcinoma with negative EGFR mutation and negative ALK gene translocation. The patient is status post stereotactic radiotherapy to a solitary brain metastases in addition to palliative radiotherapy to the right upper lobe lung mass. She status post 6 cycles of systemic chemotherapy with carboplatin and Alimta with improvement in her disease and she is currently undergoing maintenance chemotherapy with single agent Alimta status post 36 cycles and tolerating her treatment fairly well. I recommended for her to continue her maintenance systemic chemotherapy with single agent Alimta and she will receive cycle #37 today. The patient would come back for follow-up visit in 3 weeks for evaluation before starting cycle #38. For anxiety, she will continue on Xanax. I gave her refill of Xanax today The patient was advised to call immediately if she has any concerning symptoms in the interval. The patient voices understanding of current disease status and treatment options and is in agreement with the current care plan.  All questions were answered. The patient knows to call the clinic with any problems,  questions or concerns. We can certainly see the patient much sooner if necessary.  Disclaimer: This note was dictated with voice recognition software. Similar sounding words can inadvertently be transcribed and may not be corrected upon review.

## 2016-07-11 NOTE — Patient Instructions (Signed)
[facility] Discharge Instructions for Patients Receiving Chemotherapy  Today you received the following chemotherapy agents Alimta.  To help prevent nausea and vomiting after your treatment, we encourage you to take your nausea medication as directed.   If you develop nausea and vomiting that is not controlled by your nausea medication, call the clinic.   BELOW ARE SYMPTOMS THAT SHOULD BE REPORTED IMMEDIATELY:  *FEVER GREATER THAN 100.5 F  *CHILLS WITH OR WITHOUT FEVER  NAUSEA AND VOMITING THAT IS NOT CONTROLLED WITH YOUR NAUSEA MEDICATION  *UNUSUAL SHORTNESS OF BREATH  *UNUSUAL BRUISING OR BLEEDING  TENDERNESS IN MOUTH AND THROAT WITH OR WITHOUT PRESENCE OF ULCERS  *URINARY PROBLEMS  *BOWEL PROBLEMS  UNUSUAL RASH Items with * indicate a potential emergency and should be followed up as soon as possible.  Feel free to call the clinic you have any questions or concerns. The clinic phone number is [phone].  Please show the [place] at check-in to the Emergency Department and triage nurse.  Cyanocobalamin, Vitamin B12 injection What is this medicine? CYANOCOBALAMIN (sye an oh koe BAL a min) is a man made form of vitamin B12. Vitamin B12 is used in the growth of healthy blood cells, nerve cells, and proteins in the body. It also helps with the metabolism of fats and carbohydrates. This medicine is used to treat people who can not absorb vitamin B12. This medicine may be used for other purposes; ask your health care provider or pharmacist if you have questions. What should I tell my health care provider before I take this medicine? They need to know if you have any of these conditions: -kidney disease -Leber's disease -megaloblastic anemia -an unusual or allergic reaction to cyanocobalamin, cobalt, other medicines, foods, dyes, or preservatives -pregnant or trying to get pregnant -breast-feeding How should I use this medicine? This medicine is  injected into a muscle or deeply under the skin. It is usually given by a health care professional in a clinic or doctor's office. However, your doctor may teach you how to inject yourself. Follow all instructions. Talk to your pediatrician regarding the use of this medicine in children. Special care may be needed. Overdosage: If you think you have taken too much of this medicine contact a poison control center or emergency room at once. NOTE: This medicine is only for you. Do not share this medicine with others. What if I miss a dose? If you are given your dose at a clinic or doctor's office, call to reschedule your appointment. If you give your own injections and you miss a dose, take it as soon as you can. If it is almost time for your next dose, take only that dose. Do not take double or extra doses. What may interact with this medicine? -colchicine -heavy alcohol intake This list may not describe all possible interactions. Give your health care provider a list of all the medicines, herbs, non-prescription drugs, or dietary supplements you use. Also tell them if you smoke, drink alcohol, or use illegal drugs. Some items may interact with your medicine. What should I watch for while using this medicine? Visit your doctor or health care professional regularly. You may need blood work done while you are taking this medicine. You may need to follow a special diet. Talk to your doctor. Limit your alcohol intake and avoid smoking to get the best benefit. What side effects may I notice from receiving this medicine? Side effects that you should report to your doctor  or health care professional as soon as possible: -allergic reactions like skin rash, itching or hives, swelling of the face, lips, or tongue -blue tint to skin -chest tightness, pain -difficulty breathing, wheezing -dizziness -red, swollen painful area on the leg Side effects that usually do not require medical attention (report to your  doctor or health care professional if they continue or are bothersome): -diarrhea -headache This list may not describe all possible side effects. Call your doctor for medical advice about side effects. You may report side effects to FDA at 1-800-FDA-1088. Where should I keep my medicine? Keep out of the reach of children. Store at room temperature between 15 and 30 degrees C (59 and 85 degrees F). Protect from light. Throw away any unused medicine after the expiration date. NOTE: This sheet is a summary. It may not cover all possible information. If you have questions about this medicine, talk to your doctor, pharmacist, or health care provider.    2016, Elsevier/Gold Standard. (2007-11-26 22:10:20)

## 2016-07-11 NOTE — Telephone Encounter (Signed)
Message sent to chemo scheduler to be added per 07/11/16 los. Appointments scheduled per 07/11/16 los. AVS report and appointment schedule given to patient per 07/11/16 los.

## 2016-07-11 NOTE — Progress Notes (Signed)
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   Interval Since Last Radiation:  31 months  10/28/2013 through 12/05/2013: The patient was treated to the right lung tumor to a dose of 50 gray in 25 fractions using a 3-D conformal technique. Daily image guidance was used for the patient's treatment.  10/16/2013: Rt Parietal 98m target was treated using 3 Arcs to a prescription dose of 20 Gy. ExacTrac Snap verification was performed for each couch angle.  Narrative:  The patient returns today for routine follow-up. In summary this is a 65y.o. patient with a history of metastatic lung cancer to the brain who was treated in two sessions in 2015 for her brain disease. Since her treatment, she has done well and continues to be NED in the brain, and on systemic alimta. 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 She had a repeat MRI of the brain on 07/07/16, which revealed debility treated lesion in the right parietal region measuring 16 x 10 mm, and stability of a 12 mm non enhancing nodule in the left parietal scalp. No evidence of new intracranial disease is identified.   On review of systems, the patient reports that she is doing well overall. She denies any chest pain, shortness of breath, cough, fevers, chills, night sweats, unintended weight changes. She denies any bowel or bladder disturbances, and denies abdominal pain, nausea or vomiting. She denies any new musculoskeletal or joint aches or pains. A complete review of systems is obtained and is otherwise negative.  Past Medical History:  Past  Medical History:  Diagnosis Date  . Anxiety   . Anxiety 06/20/2016  . Cervical cancer ([facility]   . Encounter for antineoplastic chemotherapy 07/20/2015  . 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 ABDOMINAL HYSTERECTOMY    . COLOSTOMY TAKEDOWN N/A 07/10/2014   Procedure: LAPAROSCOPIC LYSIS OF ADHESIONS (90 MIN) LAPAROSCOPIC ASSISTED COLOSTOMY CLOSURE, RIGID PROCTOSIGMOIDOSCOPY;  Surgeon: TJackolyn Confer MD;  Location: [facility];  Service: General;  Laterality: N/A;  . LAPAROTOMY N/A 11/03/2013   Procedure: EXPLORATORY LAPAROTOMY, DRAINAGE OF INTRA  ABDOMINAL ABSCESSES, MOBILIZATION OF SPLENIC FLEXURE, SIGMOID COLECTOMY WITH COLOSTOMY;  Surgeon: TOdis Hollingshead MD;  Location: [facility];  Service: General;  Laterality: N/A;  . VIDEO BRONCHOSCOPY Bilateral 08/30/2013   Procedure: VIDEO BRONCHOSCOPY WITH FLUORO;  Surgeon: MTanda Rockers MD;  [facility];  Service: Cardiopulmonary;  Laterality: Bilateral;    Social History:  Social History   Social History  . Marital status: Married    Spouse name: N/A  . Number of children: N/A  . Years of education: N/A   Occupational History  . 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 Smoking status: Former Smoker    Packs/day: 1.00    Years: 40.00    Types: Cigarettes    Quit date: 09/27/2013  . Smokeless tobacco: Never Used  . Alcohol use No  . Drug  use: No  . Sexual activity: Yes   Other Topics Concern  . Not on file   Social History Narrative  . No narrative on file    Family History: Family History  Problem Relation Age of Onset  . Emphysema Father     smoked  . Lung cancer Father     smoked  . Cancer Mother   . Hypertension Mother   . COPD Mother     ALLERGIES:  is allergic to codeine.  Meds: Current Outpatient Prescriptions    Medication Sig Dispense Refill  . acetaminophen (TYLENOL) 500 MG tablet Take 500 mg by mouth every 6 (six) hours as needed for mild pain or headache. Reported on 11/02/2015    . Ascorbic Acid (VITAMIN C GUMMIE PO) Take 1 each by mouth every morning.    Marland Kitchen dexamethasone (DECADRON) 4 MG tablet TAKE 1 TABLET BY MOUTH TWICE A DAY THE DAY BEFORE, DAY OF AND DAY AFTER THE CHEMOTHERAPY EVERY 3 WKS 40 tablet 1  . folic acid (FOLVITE) 1 MG tablet Take 1 tablet (1 mg total) by mouth daily. 90 tablet 0  . Multiple Vitamin (MULTIVITAMIN WITH MINERALS) TABS tablet Take 1 tablet by mouth every morning.    Marland Kitchen omeprazole (PRILOSEC) 20 MG capsule Take 1 capsule (20 mg total) by mouth daily. As needed for reflux or indigestion. 42 capsule PRN  . ondansetron (ZOFRAN) 8 MG tablet Take 1 tablet (8 mg total) by mouth every 8 (eight) hours as needed for nausea or vomiting. (Patient not taking: Reported on 07/11/2016) 30 tablet 0  . OVER THE COUNTER MEDICATION Take 1 tablet by mouth every morning. (Vitamin A)    . pentoxifylline (TRENTAL) 400 MG CR tablet TAKE ONE TABLET BY MOUTH TWICE DAILY, ALSO  TAKE  VITAMIN  E  400  UNITS  TWICE  DAILY  WITH  PENTOXIFYLLINE 60 tablet 0  . prochlorperazine (COMPAZINE) 10 MG tablet Take 1 tablet (10 mg total) by mouth every 6 (six) hours as needed for nausea or vomiting. (Patient not taking: Reported on 07/11/2016) 60 tablet 0  . senna-docusate (SENOKOT-S) 8.6-50 MG tablet Take 1 tablet by mouth daily. 30 tablet prn  . ALPRAZolam (XANAX) 0.25 MG tablet Take 1 tablet (0.25 mg total) by mouth 3 (three) times daily as needed. for anxiety 45 tablet 0   No current facility-administered medications for this encounter.     Physical Findings:  height is '5\' 4"'$  (1.626 m) and weight is 162 lb 12.8 oz (73.8 kg). Her oral temperature is 98.4 F (36.9 C). Her blood pressure is 104/54 (abnormal) and her pulse is 76. Her respiration is 18 and oxygen saturation is 100%.  In general this is a well  appearing Caucasian female in no acute distress. She's alert and oriented x4 and appropriate throughout the examination. She is normocephalic, atraumatic. EOMS are intact. Cardiopulmonary assessment is negative for acute distress and she exhibits normal effort. She appears to be grossly intact from a neurologic perspective.  Lab Findings: Lab Results  Component Value Date   WBC 10.3 07/11/2016   HGB 12.3 07/11/2016   HCT 37.5 07/11/2016   MCV 99.7 07/11/2016   PLT 284 07/11/2016     Radiographic Findings: Ct Chest W Contrast  Result Date: 06/17/2016 CLINICAL DATA:  Stage IV right upper lobe lung adenocarcinoma diagnosed January 2015 with solitary brain metastasis status post stereotactic radiation therapy and palate radiation therapy to the right upper lobe lung mass with ongoing chemotherapy, presenting for restaging. EXAM:  CT CHEST, ABDOMEN, AND PELVIS WITH CONTRAST TECHNIQUE: Multidetector CT imaging of the chest, abdomen and pelvis was performed following the standard protocol during bolus administration of intravenous contrast. CONTRAST:  114m ISOVUE-300 IOPAMIDOL (ISOVUE-300) INJECTION 61% COMPARISON:  03/25/2016 CT chest, abdomen and pelvis. FINDINGS: CT CHEST FINDINGS Cardiovascular: Normal heart size. No significant pericardial fluid/thickening. Atherosclerotic nonaneurysmal thoracic aorta. Normal caliber pulmonary arteries. No central pulmonary emboli. Mediastinum/Nodes: No discrete thyroid nodules. Unremarkable esophagus. No pathologically enlarged axillary, mediastinal or hilar lymph nodes. Lungs/Pleura: No pneumothorax. No pleural effusion. Mild centrilobular and paraseptal emphysema with diffuse bronchial wall thickening. Posterior apical right upper lobe 2.4 x 1.4 cm solid pulmonary nodule (series 5/ image 31), previously 2.4 x 1.4 cm, stable. Stable bandlike reticulation and ground-glass attenuation with associated volume loss and distortion surrounding the apical right upper lobe  pulmonary nodule, consistent with stable postradiation change. Anterior right lower lobe 3 mm (series 5/ image 86) and 2 mm (series 5/ image 78) pulmonary nodules are stable back to 08/19/2013 and considered benign. No acute consolidative airspace disease or new significant pulmonary nodules. Musculoskeletal: No aggressive appearing focal osseous lesions. Mild-to-moderate thoracic spondylosis. CT ABDOMEN PELVIS FINDINGS Hepatobiliary: Normal liver with no liver mass. Normal gallbladder with no radiopaque cholelithiasis. No biliary ductal dilatation. Pancreas: Normal, with no mass or duct dilation. Spleen: Normal size. No mass. Adrenals/Urinary Tract: Normal adrenals. No hydronephrosis. No renal mass . Normal bladder. Stomach/Bowel: Grossly normal stomach. Normal caliber small bowel with no small bowel wall thickening. Normal appendix. Stable subtotal distal colectomy with intact appearing colorectal anastomosis. Oral contrast progresses to the rectum. No large bowel wall thickening or pericolonic fat stranding. Vascular/Lymphatic: Atherosclerotic nonaneurysmal abdominal aorta. Patent portal, splenic, hepatic and renal veins. No pathologically enlarged lymph nodes in the abdomen or pelvis. Reproductive: Grossly normal uterus.  No adnexal mass. Other: No pneumoperitoneum, ascites or focal fluid collection. Musculoskeletal: No aggressive appearing focal osseous lesions. Mild lumbar spondylosis. IMPRESSION: 1. Stable treated primary apical right upper lobe pulmonary nodule with stable surrounding post radiation change. 2. No pathologically enlarged thoracic lymph nodes or other findings of metastatic disease in the chest. 3. No metastatic disease in the abdomen or pelvis. 4. Additional findings include aortic atherosclerosis and mild emphysema. Electronically Signed   By: JIlona SorrelM.D.   On: 06/17/2016 11:48   Mr BJeri CosWFUContrast  Result Date: 07/07/2016 CLINICAL DATA:  Metastatic lung cancer status post  radio surgery. EXAM: MRI HEAD WITHOUT AND WITH CONTRAST TECHNIQUE: Multiplanar, multiecho pulse sequences of the brain and surrounding structures were obtained without and with intravenous contrast. CONTRAST:  15 mL MultiHance COMPARISON:  03/04/2016 FINDINGS: Brain: There is no evidence of acute infarct, midline shift, or extra-axial fluid collection. Ventricles and sulci are normal for age. Irregular, peripherally enhancing right parietal lesion is unchanged in size, measuring 16 x 10 mm (series 10, image 109). Surrounding white matter T2 hyperintensity has slightly increased. No new enhancing brain lesions are identified. A small right frontal lobe developmental venous anomaly is again noted. Patchy T2 hyperintensities in the cerebral white matter bilaterally are unchanged and compatible with chronic small vessel ischemic disease. Vascular: Major intracranial vascular flow voids are preserved. Skull and upper cervical spine: No suspicious osseous lesion identified. Sinuses/Orbits: Unremarkable orbits. Mild left ethmoid air cell mucosal thickening. Clear mastoid air cells. Other: A 12 mm nonenhancing nodule is again seen in the left parietal scalp, benign in appearance. IMPRESSION: 1. Unchanged size of treated right parietal lobe lesion. Slightly increased surrounding edema. 2.  No evidence of new intracranial metastases. Electronically Signed   By: Logan Bores M.D.   On: 07/07/2016 13:56   Ct Abdomen Pelvis W Contrast  Result Date: 06/17/2016 CLINICAL DATA:  Stage IV right upper lobe lung adenocarcinoma diagnosed January 2015 with solitary brain metastasis status post stereotactic radiation therapy and palate radiation therapy to the right upper lobe lung mass with ongoing chemotherapy, presenting for restaging. EXAM: CT CHEST, ABDOMEN, AND PELVIS WITH CONTRAST TECHNIQUE: Multidetector CT imaging of the chest, abdomen and pelvis was performed following the standard protocol during bolus administration of  intravenous contrast. CONTRAST:  167m ISOVUE-300 IOPAMIDOL (ISOVUE-300) INJECTION 61% COMPARISON:  03/25/2016 CT chest, abdomen and pelvis. FINDINGS: CT CHEST FINDINGS Cardiovascular: Normal heart size. No significant pericardial fluid/thickening. Atherosclerotic nonaneurysmal thoracic aorta. Normal caliber pulmonary arteries. No central pulmonary emboli. Mediastinum/Nodes: No discrete thyroid nodules. Unremarkable esophagus. No pathologically enlarged axillary, mediastinal or hilar lymph nodes. Lungs/Pleura: No pneumothorax. No pleural effusion. Mild centrilobular and paraseptal emphysema with diffuse bronchial wall thickening. Posterior apical right upper lobe 2.4 x 1.4 cm solid pulmonary nodule (series 5/ image 31), previously 2.4 x 1.4 cm, stable. Stable bandlike reticulation and ground-glass attenuation with associated volume loss and distortion surrounding the apical right upper lobe pulmonary nodule, consistent with stable postradiation change. Anterior right lower lobe 3 mm (series 5/ image 86) and 2 mm (series 5/ image 78) pulmonary nodules are stable back to 08/19/2013 and considered benign. No acute consolidative airspace disease or new significant pulmonary nodules. Musculoskeletal: No aggressive appearing focal osseous lesions. Mild-to-moderate thoracic spondylosis. CT ABDOMEN PELVIS FINDINGS Hepatobiliary: Normal liver with no liver mass. Normal gallbladder with no radiopaque cholelithiasis. No biliary ductal dilatation. Pancreas: Normal, with no mass or duct dilation. Spleen: Normal size. No mass. Adrenals/Urinary Tract: Normal adrenals. No hydronephrosis. No renal mass . Normal bladder. Stomach/Bowel: Grossly normal stomach. Normal caliber small bowel with no small bowel wall thickening. Normal appendix. Stable subtotal distal colectomy with intact appearing colorectal anastomosis. Oral contrast progresses to the rectum. No large bowel wall thickening or pericolonic fat stranding.  Vascular/Lymphatic: Atherosclerotic nonaneurysmal abdominal aorta. Patent portal, splenic, hepatic and renal veins. No pathologically enlarged lymph nodes in the abdomen or pelvis. Reproductive: Grossly normal uterus.  No adnexal mass. Other: No pneumoperitoneum, ascites or focal fluid collection. Musculoskeletal: No aggressive appearing focal osseous lesions. Mild lumbar spondylosis. IMPRESSION: 1. Stable treated primary apical right upper lobe pulmonary nodule with stable surrounding post radiation change. 2. No pathologically enlarged thoracic lymph nodes or other findings of metastatic disease in the chest. 3. No metastatic disease in the abdomen or pelvis. 4. Additional findings include aortic atherosclerosis and mild emphysema. Electronically Signed   By: JIlona SorrelM.D.   On: 06/17/2016 11:48    Impression/Plan: 1. 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 Fortunately she appears again to be without evidence of disease. We discussed the findings, and the patient is interested in moving to 6 month interval MRI scans. We discussed the limitations of this timeframe, however she has also had significant stability since her last radiation treatment. We will plan for her next scan in 6 months time, but she does understand that if she were to develop any neurologic symptoms or symptoms of concern, that she needs to keep [place]formed of this which could alter the timing of her next MRI scan.she has plans to follow-up as well with Dr. MJulien Nordmannand continues on Alimta  and has had stable systemic disease. 2. Radiation necrosis. The patient is doing well and is asymptomatic with the use of  Vitamin E and Trental. We would continue to recommend this moving forward, and she will keep [place] informed any need for refills.  We would recommend that she continue this, and we will provide additional prescriptions as  needed.    Carola Rhine, PAC

## 2016-07-11 NOTE — Telephone Encounter (Signed)
Per LOS I have scheduled appts and notified the scheduler 

## 2016-07-18 ENCOUNTER — Other Ambulatory Visit: Payer: Self-pay | Admitting: Medical Oncology

## 2016-07-18 DIAGNOSIS — C3490 Malignant neoplasm of unspecified part of unspecified bronchus or lung: Secondary | ICD-10-CM

## 2016-07-18 MED ORDER — FOLIC ACID 1 MG PO TABS: 1.0000 mg | ORAL_TABLET | Freq: Every day | ORAL | 0 refills | Status: DC

## 2016-08-01 ENCOUNTER — Encounter: Payer: Self-pay | Admitting: Internal Medicine

## 2016-08-01 ENCOUNTER — Ambulatory Visit (HOSPITAL_BASED_OUTPATIENT_CLINIC_OR_DEPARTMENT_OTHER): Payer: BLUE CROSS/BLUE SHIELD

## 2016-08-01 ENCOUNTER — Telehealth: Payer: Self-pay | Admitting: Internal Medicine

## 2016-08-01 ENCOUNTER — Ambulatory Visit (HOSPITAL_BASED_OUTPATIENT_CLINIC_OR_DEPARTMENT_OTHER): Payer: BLUE CROSS/BLUE SHIELD | Admitting: Internal Medicine

## 2016-08-01 ENCOUNTER — Other Ambulatory Visit (HOSPITAL_BASED_OUTPATIENT_CLINIC_OR_DEPARTMENT_OTHER): Payer: BLUE CROSS/BLUE SHIELD

## 2016-08-01 VITALS — BP 118/62 | HR 66 | Temp 97.8°F | Resp 18 | Ht 64.0 in | Wt 165.5 lb

## 2016-08-01 DIAGNOSIS — C3411 Malignant neoplasm of upper lobe, right bronchus or lung: Secondary | ICD-10-CM

## 2016-08-01 DIAGNOSIS — Z5111 Encounter for antineoplastic chemotherapy: Secondary | ICD-10-CM

## 2016-08-01 DIAGNOSIS — C7931 Secondary malignant neoplasm of brain: Secondary | ICD-10-CM | POA: Diagnosis not present

## 2016-08-01 DIAGNOSIS — C7949 Secondary malignant neoplasm of other parts of nervous system: Principal | ICD-10-CM

## 2016-08-01 LAB — COMPREHENSIVE METABOLIC PANEL
ALT: 13 U/L (ref 0–55)
AST: 11 U/L (ref 5–34)
Albumin: 3.4 g/dL — ABNORMAL LOW (ref 3.5–5.0)
Alkaline Phosphatase: 103 U/L (ref 40–150)
Anion Gap: 10 mEq/L (ref 3–11)
BUN: 15.4 mg/dL (ref 7.0–26.0)
CO2: 23 mEq/L (ref 22–29)
Calcium: 9.3 mg/dL (ref 8.4–10.4)
Chloride: 107 mEq/L (ref 98–109)
Creatinine: 0.8 mg/dL (ref 0.6–1.1)
EGFR: 75 mL/min/{1.73_m2} — ABNORMAL LOW (ref >90)
Glucose: 138 mg/dl (ref 70–140)
Potassium: 3.7 mEq/L (ref 3.5–5.1)
Sodium: 140 mEq/L (ref 136–145)
Total Bilirubin: 0.33 mg/dL (ref 0.20–1.20)
Total Protein: 7.2 g/dL (ref 6.4–8.3)

## 2016-08-01 LAB — CBC WITH DIFFERENTIAL/PLATELET
BASO%: 0 % (ref 0.0–2.0)
Basophils Absolute: 0 10*3/uL (ref 0.0–0.1)
EOS%: 0 % (ref 0.0–7.0)
Eosinophils Absolute: 0 10*3/uL (ref 0.0–0.5)
HCT: 37.5 % (ref 34.8–46.6)
HGB: 12.1 g/dL (ref 11.6–15.9)
LYMPH%: 5.6 % — ABNORMAL LOW (ref 14.0–49.7)
MCH: 32.5 pg (ref 25.1–34.0)
MCHC: 32.3 g/dL (ref 31.5–36.0)
MCV: 100.8 fL (ref 79.5–101.0)
MONO#: 0.3 10*3/uL (ref 0.1–0.9)
MONO%: 4.1 % (ref 0.0–14.0)
NEUT#: 7.2 10*3/uL — ABNORMAL HIGH (ref 1.5–6.5)
NEUT%: 90.3 % — ABNORMAL HIGH (ref 38.4–76.8)
Platelets: 272 10*3/uL (ref 145–400)
RBC: 3.72 10*6/uL (ref 3.70–5.45)
RDW: 14.3 % (ref 11.2–14.5)
WBC: 8 10*3/uL (ref 3.9–10.3)
lymph#: 0.5 10*3/uL — ABNORMAL LOW (ref 0.9–3.3)

## 2016-08-01 MED ORDER — ONDANSETRON HCL 8 MG PO TABS
ORAL_TABLET | ORAL | Status: AC
  Filled 2016-08-01: qty 1

## 2016-08-01 MED ORDER — DEXAMETHASONE SODIUM PHOSPHATE 10 MG/ML IJ SOLN
10.0000 mg | Freq: Once | INTRAMUSCULAR | Status: AC
  Administered 2016-08-01: 10 mg via INTRAVENOUS

## 2016-08-01 MED ORDER — ONDANSETRON HCL 8 MG PO TABS
8.0000 mg | ORAL_TABLET | Freq: Once | ORAL | Status: AC
  Administered 2016-08-01: 8 mg via ORAL

## 2016-08-01 MED ORDER — SODIUM CHLORIDE 0.9 % IV SOLN
Freq: Once | INTRAVENOUS | Status: AC
  Administered 2016-08-01: 11:00:00 via INTRAVENOUS

## 2016-08-01 MED ORDER — DEXAMETHASONE SODIUM PHOSPHATE 10 MG/ML IJ SOLN
INTRAMUSCULAR | Status: AC
  Filled 2016-08-01: qty 1

## 2016-08-01 MED ORDER — SODIUM CHLORIDE 0.9 % IV SOLN
500.0000 mg/m2 | Freq: Once | INTRAVENOUS | Status: AC
  Administered 2016-08-01: 900 mg via INTRAVENOUS
  Filled 2016-08-01: qty 20

## 2016-08-01 NOTE — Progress Notes (Signed)
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  PRIOR THERAPY: 1) Status post stereotactic radiotherapy to a solitary right parietal brain lesion under the care of Dr. [name] on 10/16/2013. 2) Status post palliative radiotherapy to the right lung tumor under the care of Dr. [name] completed on 12/05/2013. 3) Systemic chemotherapy with carboplatin for AUC of 5 and Alimta 500 mg/M2 every 3 weeks. First dose Jan 06 2014. Status post 6 cycles.  CURRENT THERAPY: Systemic chemotherapy with maintenance Alimta 500 MG/M2 every 3 weeks. Status post 37 cycles.  INTERVAL HISTORY: Alexis [name] 65 y.o. female returns to the clinic today for follow-up visit accompanied by her husband. The patient is here today for evaluation and close monitoring of her chemotherapy treatment. She tolerated the last cycle of her treatment well with no significant adverse effects. She will return 45 in February 2018 and she will lose her insurance with her husband starting 08/29/2016. She has not received her Medicare card. She is worried about CT scans done after she loose her insurance. She denied having any other complaints today except for mild fatigue. She has no chest pain, shortness of breath, cough or hemoptysis. She has no nausea or vomiting. She is here today for evaluation before starting cycle #38 of her treatment.  MEDICAL HISTORY: Past Medical History:  Diagnosis Date  . Anxiety   . Anxiety 06/20/2016  . Cervical cancer ([facility])   . Encounter for antineoplastic chemotherapy 07/20/2015  . Malignant neoplasm of right upper lobe of lung ([facility])     non small cell lung cancer adenocarcioma with  brain meta    ALLERGIES:  is allergic to codeine.  MEDICATIONS:  Current Outpatient Prescriptions  Medication Sig Dispense Refill  . acetaminophen (TYLENOL) 500 MG tablet Take 500 mg by mouth every 6 (six) hours as needed for mild pain or headache. Reported on 11/02/2015    . ALPRAZolam (XANAX) 0.25 MG tablet Take 1 tablet (0.25 mg total) by mouth 3 (three) times daily as needed. for anxiety 45 tablet 0  . Ascorbic Acid (VITAMIN C GUMMIE PO) Take 1 each by mouth every morning.    Marland Kitchen dexamethasone (DECADRON) 4 MG tablet TAKE 1 TABLET BY MOUTH TWICE A DAY THE DAY BEFORE, DAY OF AND DAY AFTER THE CHEMOTHERAPY EVERY 3 WKS 40 tablet 1  . folic acid (FOLVITE) 1 MG tablet Take 1 tablet (1 mg total) by mouth daily. 90 tablet 0  . Multiple Vitamin (MULTIVITAMIN WITH MINERALS) TABS tablet Take 1 tablet by mouth every morning.    Marland Kitchen omeprazole (PRILOSEC) 20 MG capsule Take 1 capsule (20 mg total) by mouth daily. As needed for reflux or indigestion. 42 capsule PRN  . ondansetron (ZOFRAN) 8 MG tablet Take 1 tablet (8 mg total) by mouth every 8 (eight) hours as needed for nausea or vomiting. 30 tablet 0  . OVER THE COUNTER MEDICATION Take 1 tablet by mouth every morning. (Vitamin A)    . pentoxifylline (TRENTAL) 400 MG CR tablet TAKE ONE TABLET BY MOUTH TWICE DAILY, ALSO  TAKE  VITAMIN  E  400  UNITS  TWICE  DAILY  WITH  PENTOXIFYLLINE 60 tablet 0  . prochlorperazine (  COMPAZINE) 10 MG tablet Take 1 tablet (10 mg total) by mouth every 6 (six) hours as needed for nausea or vomiting. 60 tablet 0  . senna-docusate (SENOKOT-S) 8.6-50 MG tablet Take 1 tablet by mouth daily. 30 tablet prn   No current facility-administered medications for this visit.     SURGICAL HISTORY:  Past Surgical History:  Procedure Laterality Date  . ABDOMINAL HYSTERECTOMY    . COLOSTOMY TAKEDOWN N/A 07/10/2014   Procedure: LAPAROSCOPIC LYSIS OF ADHESIONS (90 MIN) LAPAROSCOPIC ASSISTED COLOSTOMY CLOSURE, RIGID PROCTOSIGMOIDOSCOPY;   Surgeon: Jackolyn Confer, MD;  Location: [facility];  Service: General;  Laterality: N/A;  . LAPAROTOMY N/A 11/03/2013   Procedure: EXPLORATORY LAPAROTOMY, DRAINAGE OF INTRA  ABDOMINAL ABSCESSES, MOBILIZATION OF SPLENIC FLEXURE, SIGMOID COLECTOMY WITH COLOSTOMY;  Surgeon: Odis Hollingshead, MD;  Location: [facility];  Service: General;  Laterality: N/A;  . VIDEO BRONCHOSCOPY Bilateral 08/30/2013   Procedure: VIDEO BRONCHOSCOPY WITH FLUORO;  Surgeon: Tanda Rockers, MD;  Location: [facility];  Service: Cardiopulmonary;  Laterality: Bilateral;    REVIEW OF SYSTEMS:  A comprehensive review of systems was negative except for: Constitutional: positive for fatigue   PHYSICAL EXAMINATION: General appearance: alert, cooperative, fatigued and no distress Head: Normocephalic, without obvious abnormality, atraumatic Neck: no adenopathy, no JVD, supple, symmetrical, trachea midline and thyroid not enlarged, symmetric, no tenderness/mass/nodules Lymph nodes: Cervical, supraclavicular, and axillary nodes normal. Resp: clear to auscultation bilaterally Back: symmetric, no curvature. ROM normal. No CVA tenderness. Cardio: regular rate and rhythm, S1, S2 normal, no murmur, click, rub or gallop GI: soft, non-tender; bowel sounds normal; no masses,  no organomegaly Extremities: extremities normal, atraumatic, no cyanosis or edema  ECOG PERFORMANCE STATUS: 1 - Symptomatic but completely ambulatory  Blood pressure 118/62, pulse 66, temperature 97.8 F (36.6 C), temperature source Oral, resp. rate 18, height _0  (1.626 m), weight 165 lb 8 oz (75.1 kg), SpO2 100 %.  LABORATORY DATA: Lab Results  Component Value Date   WBC 8.0 08/01/2016   HGB 12.1 08/01/2016   HCT 37.5 08/01/2016   MCV 100.8 08/01/2016   PLT 272 08/01/2016      Chemistry      Component Value Date/Time   NA 140 08/01/2016 0857   K 3.7 08/01/2016 0857   CL 100 07/12/2014 0512   CO2 23 08/01/2016 0857   BUN 15.4 08/01/2016 0857   CREATININE  0.8 08/01/2016 0857      Component Value Date/Time   CALCIUM 9.3 08/01/2016 0857   ALKPHOS 103 08/01/2016 0857   AST 11 08/01/2016 0857   ALT 13 08/01/2016 0857   BILITOT 0.33 08/01/2016 0857       RADIOGRAPHIC STUDIES: Mr [name] FS Contrast  Result Date: 07/07/2016 CLINICAL DATA:  Metastatic lung cancer status post radio surgery. EXAM: MRI HEAD WITHOUT AND WITH CONTRAST TECHNIQUE: Multiplanar, multiecho pulse sequences of the brain and surrounding structures were obtained without and with intravenous contrast. CONTRAST:  15 mL MultiHance COMPARISON:  03/04/2016 FINDINGS: Brain: There is no evidence of acute infarct, midline shift, or extra-axial fluid collection. Ventricles and sulci are normal for age. Irregular, peripherally enhancing right parietal lesion is unchanged in size, measuring 16 x 10 mm (series 10, image 109). Surrounding white matter T2 hyperintensity has slightly increased. No new enhancing brain lesions are identified. A small right frontal lobe developmental venous anomaly is again noted. Patchy T2 hyperintensities in the cerebral white matter bilaterally are unchanged and compatible with chronic small vessel ischemic disease. Vascular: Major intracranial vascular  flow voids are preserved. Skull and upper cervical spine: No suspicious osseous lesion identified. Sinuses/Orbits: Unremarkable orbits. Mild left ethmoid air cell mucosal thickening. Clear mastoid air cells. Other: A 12 mm nonenhancing nodule is again seen in the left parietal scalp, benign in appearance. IMPRESSION: 1. Unchanged size of treated right parietal lobe lesion. Slightly increased surrounding edema. 2. No evidence of new intracranial metastases. Electronically Signed   By: Logan Bores M.D.   On: 07/07/2016 13:56    ASSESSMENT AND PLAN: This is a very pleasant 65 years old white female with a stage IV non-small cell lung cancer, adenocarcinoma status post induction systemic chemotherapy with carboplatin and  Alimta and currently undergoing treatment with maintenance single agent Alimta status post 37 cycles. She tolerated the last cycle of her treatment well with no significant adverse effects. I reviewed her blood work and recommended for the patient to proceed with cycle #38 today as scheduled. She will come back for follow-up visit in 3 weeks for evaluation after repeating CT scan of the chest, abdomen and pelvis for restaging of her disease. She was advised to call immediately if she has any concerning symptoms in the interval. The patient voices understanding of current disease status and treatment options and is in agreement with the current care plan.  All questions were answered. The patient knows to call the clinic with any problems, questions or concerns. We can certainly see the patient much sooner if necessary.   Disclaimer: This note was dictated with voice recognition software. Similar sounding words can inadvertently be transcribed and may not be corrected upon review.

## 2016-08-01 NOTE — Patient Instructions (Signed)
[facility] Discharge Instructions for Patients Receiving Chemotherapy  Today you received the following chemotherapy agents: Alimta.  To help prevent nausea and vomiting after your treatment, we encourage you to take your nausea medication: Compazine. Take one every 6 hours as needed. If you develop nausea and vomiting that is not controlled by your nausea medication, call the clinic.   BELOW ARE SYMPTOMS THAT SHOULD BE REPORTED IMMEDIATELY:  *FEVER GREATER THAN 100.5 F  *CHILLS WITH OR WITHOUT FEVER  NAUSEA AND VOMITING THAT IS NOT CONTROLLED WITH YOUR NAUSEA MEDICATION  *UNUSUAL SHORTNESS OF BREATH  *UNUSUAL BRUISING OR BLEEDING  TENDERNESS IN MOUTH AND THROAT WITH OR WITHOUT PRESENCE OF ULCERS  *URINARY PROBLEMS  *BOWEL PROBLEMS  UNUSUAL RASH Items with * indicate a potential emergency and should be followed up as soon as possible.  Feel free to call the clinic should you have any questions or concerns. The clinic phone number is [phone].  Please show the CHEMO ALERT CARD at check-in to the Emergency Department and triage nurse.   

## 2016-08-01 NOTE — Telephone Encounter (Signed)
2 bottles of contrast given to patient, per 08/01/16 los. Appointments scheduled per 08/01/16 los. A copy of the AVS report and appointment schedule was given to the patient, per 08/01/16 los.

## 2016-08-18 ENCOUNTER — Ambulatory Visit (HOSPITAL_COMMUNITY)
Admission: RE | Admit: 2016-08-18 | Discharge: 2016-08-18 | Disposition: A | Discharge status: Home / Self Care | Payer: BLUE CROSS/BLUE SHIELD | Source: Ambulatory Visit | Attending: Internal Medicine | Admitting: Internal Medicine

## 2016-08-18 ENCOUNTER — Encounter (HOSPITAL_COMMUNITY): Payer: Self-pay

## 2016-08-18 DIAGNOSIS — K429 Umbilical hernia without obstruction or gangrene: Secondary | ICD-10-CM | POA: Diagnosis not present

## 2016-08-18 DIAGNOSIS — C3411 Malignant neoplasm of upper lobe, right bronchus or lung: Secondary | ICD-10-CM | POA: Diagnosis present

## 2016-08-18 DIAGNOSIS — C7949 Secondary malignant neoplasm of other parts of nervous system: Secondary | ICD-10-CM | POA: Insufficient documentation

## 2016-08-18 DIAGNOSIS — I7090 Unspecified atherosclerosis: Secondary | ICD-10-CM | POA: Insufficient documentation

## 2016-08-18 DIAGNOSIS — Z5111 Encounter for antineoplastic chemotherapy: Secondary | ICD-10-CM

## 2016-08-18 DIAGNOSIS — C7931 Secondary malignant neoplasm of brain: Secondary | ICD-10-CM | POA: Diagnosis present

## 2016-08-18 DIAGNOSIS — R918 Other nonspecific abnormal finding of lung field: Secondary | ICD-10-CM | POA: Insufficient documentation

## 2016-08-18 DIAGNOSIS — J439 Emphysema, unspecified: Secondary | ICD-10-CM | POA: Insufficient documentation

## 2016-08-18 MED ORDER — IOPAMIDOL (ISOVUE-300) INJECTION 61%
100.0000 mL | Freq: Once | INTRAVENOUS | Status: AC | PRN
  Administered 2016-08-18: 100 mL via INTRAVENOUS

## 2016-08-18 MED ORDER — IOPAMIDOL (ISOVUE-370) INJECTION 76%
INTRAVENOUS | Status: AC
  Filled 2016-08-18: qty 100

## 2016-08-23 ENCOUNTER — Other Ambulatory Visit (HOSPITAL_BASED_OUTPATIENT_CLINIC_OR_DEPARTMENT_OTHER): Payer: BLUE CROSS/BLUE SHIELD

## 2016-08-23 ENCOUNTER — Ambulatory Visit (HOSPITAL_BASED_OUTPATIENT_CLINIC_OR_DEPARTMENT_OTHER): Payer: BLUE CROSS/BLUE SHIELD | Admitting: Internal Medicine

## 2016-08-23 ENCOUNTER — Telehealth: Payer: Self-pay | Admitting: Internal Medicine

## 2016-08-23 ENCOUNTER — Encounter: Payer: Self-pay | Admitting: Internal Medicine

## 2016-08-23 ENCOUNTER — Ambulatory Visit (HOSPITAL_BASED_OUTPATIENT_CLINIC_OR_DEPARTMENT_OTHER): Payer: BLUE CROSS/BLUE SHIELD

## 2016-08-23 DIAGNOSIS — D6481 Anemia due to antineoplastic chemotherapy: Secondary | ICD-10-CM | POA: Diagnosis not present

## 2016-08-23 DIAGNOSIS — C7949 Secondary malignant neoplasm of other parts of nervous system: Secondary | ICD-10-CM

## 2016-08-23 DIAGNOSIS — F419 Anxiety disorder, unspecified: Secondary | ICD-10-CM

## 2016-08-23 DIAGNOSIS — C3411 Malignant neoplasm of upper lobe, right bronchus or lung: Secondary | ICD-10-CM | POA: Diagnosis not present

## 2016-08-23 DIAGNOSIS — F411 Generalized anxiety disorder: Secondary | ICD-10-CM

## 2016-08-23 DIAGNOSIS — Z5111 Encounter for antineoplastic chemotherapy: Secondary | ICD-10-CM

## 2016-08-23 DIAGNOSIS — C7931 Secondary malignant neoplasm of brain: Secondary | ICD-10-CM | POA: Diagnosis not present

## 2016-08-23 DIAGNOSIS — T451X5A Adverse effect of antineoplastic and immunosuppressive drugs, initial encounter: Secondary | ICD-10-CM

## 2016-08-23 LAB — COMPREHENSIVE METABOLIC PANEL
ALT: 10 U/L (ref 0–55)
AST: 9 U/L (ref 5–34)
Albumin: 3.3 g/dL — ABNORMAL LOW (ref 3.5–5.0)
Alkaline Phosphatase: 100 U/L (ref 40–150)
Anion Gap: 9 mEq/L (ref 3–11)
BUN: 12.1 mg/dL (ref 7.0–26.0)
CO2: 26 mEq/L (ref 22–29)
Calcium: 9.7 mg/dL (ref 8.4–10.4)
Chloride: 106 mEq/L (ref 98–109)
Creatinine: 0.8 mg/dL (ref 0.6–1.1)
EGFR: 82 mL/min/{1.73_m2} — ABNORMAL LOW (ref >90)
Glucose: 117 mg/dl (ref 70–140)
Potassium: 3.6 mEq/L (ref 3.5–5.1)
Sodium: 142 mEq/L (ref 136–145)
Total Bilirubin: 0.25 mg/dL (ref 0.20–1.20)
Total Protein: 7 g/dL (ref 6.4–8.3)

## 2016-08-23 LAB — CBC WITH DIFFERENTIAL/PLATELET
BASO%: 0.3 % (ref 0.0–2.0)
Basophils Absolute: 0 10*3/uL (ref 0.0–0.1)
EOS%: 0 % (ref 0.0–7.0)
Eosinophils Absolute: 0 10*3/uL (ref 0.0–0.5)
HCT: 34.1 % — ABNORMAL LOW (ref 34.8–46.6)
HGB: 11.5 g/dL — ABNORMAL LOW (ref 11.6–15.9)
LYMPH%: 6.3 % — ABNORMAL LOW (ref 14.0–49.7)
MCH: 33.1 pg (ref 25.1–34.0)
MCHC: 33.9 g/dL (ref 31.5–36.0)
MCV: 97.6 fL (ref 79.5–101.0)
MONO#: 0.7 10*3/uL (ref 0.1–0.9)
MONO%: 10.4 % (ref 0.0–14.0)
NEUT#: 5.6 10*3/uL (ref 1.5–6.5)
NEUT%: 83 % — ABNORMAL HIGH (ref 38.4–76.8)
Platelets: 354 10*3/uL (ref 145–400)
RBC: 3.49 10*6/uL — ABNORMAL LOW (ref 3.70–5.45)
RDW: 14.1 % (ref 11.2–14.5)
WBC: 6.8 10*3/uL (ref 3.9–10.3)
lymph#: 0.4 10*3/uL — ABNORMAL LOW (ref 0.9–3.3)

## 2016-08-23 MED ORDER — CYANOCOBALAMIN 1000 MCG/ML IJ SOLN
INTRAMUSCULAR | Status: AC
  Filled 2016-08-23: qty 1

## 2016-08-23 MED ORDER — ONDANSETRON HCL 8 MG PO TABS
ORAL_TABLET | ORAL | Status: AC
  Filled 2016-08-23: qty 1

## 2016-08-23 MED ORDER — CYANOCOBALAMIN 1000 MCG/ML IJ SOLN
1000.0000 ug | Freq: Once | INTRAMUSCULAR | Status: AC
  Administered 2016-08-23: 1000 ug via INTRAMUSCULAR

## 2016-08-23 MED ORDER — DEXAMETHASONE SODIUM PHOSPHATE 10 MG/ML IJ SOLN
INTRAMUSCULAR | Status: AC
  Filled 2016-08-23: qty 1

## 2016-08-23 MED ORDER — SODIUM CHLORIDE 0.9 % IV SOLN
Freq: Once | INTRAVENOUS | Status: AC
  Administered 2016-08-23: 10:00:00 via INTRAVENOUS

## 2016-08-23 MED ORDER — SODIUM CHLORIDE 0.9 % IV SOLN
500.0000 mg/m2 | Freq: Once | INTRAVENOUS | Status: AC
  Administered 2016-08-23: 900 mg via INTRAVENOUS
  Filled 2016-08-23: qty 20

## 2016-08-23 MED ORDER — DEXAMETHASONE SODIUM PHOSPHATE 10 MG/ML IJ SOLN
10.0000 mg | Freq: Once | INTRAMUSCULAR | Status: AC
  Administered 2016-08-23: 10 mg via INTRAVENOUS

## 2016-08-23 MED ORDER — ONDANSETRON HCL 8 MG PO TABS
8.0000 mg | ORAL_TABLET | Freq: Once | ORAL | Status: AC
  Administered 2016-08-23: 8 mg via ORAL

## 2016-08-23 MED ORDER — ALPRAZOLAM 0.25 MG PO TABS: 0.2500 mg | ORAL_TABLET | Freq: Three times a day (TID) | ORAL | 0 refills | Status: DC | PRN

## 2016-08-23 NOTE — Patient Instructions (Signed)
[facility] Discharge Instructions for Patients Receiving Chemotherapy  Today you received the following chemotherapy agents; Alimta.   To help prevent nausea and vomiting after your treatment, we encourage you to take your nausea medication as directed.    If you develop nausea and vomiting that is not controlled by your nausea medication, call the clinic.   BELOW ARE SYMPTOMS THAT SHOULD BE REPORTED IMMEDIATELY:  *FEVER GREATER THAN 100.5 F  *CHILLS WITH OR WITHOUT FEVER  NAUSEA AND VOMITING THAT IS NOT CONTROLLED WITH YOUR NAUSEA MEDICATION  *UNUSUAL SHORTNESS OF BREATH  *UNUSUAL BRUISING OR BLEEDING  TENDERNESS IN MOUTH AND THROAT WITH OR WITHOUT PRESENCE OF ULCERS  *URINARY PROBLEMS  *BOWEL PROBLEMS  UNUSUAL RASH Items with * indicate a potential emergency and should be followed up as soon as possible.  Feel free to call the clinic you have any questions or concerns. The clinic phone number is [phone].  Please show the CHEMO ALERT CARD at check-in to the Emergency Department and triage nurse.   

## 2016-08-23 NOTE — Telephone Encounter (Signed)
Chemo, follow up and lab appointments scheduled every 3 weeks per 08/23/16 los.  Patient was given a copy of the appointment schedule and AVS report, per 08/23/16 los. Appointments was rescheduled from Tuesday 09/13/16 & 10/04/16 to Monday 09/12/16 & 10/03/16, per patient request.

## 2016-08-23 NOTE — Progress Notes (Signed)
[place] [phone]   Fax:([phone]) [place], [place] [address] [place] [address]  DIAGNOSIS: Stage IV (T2a, N0, M1b) non-small cell lung cancer, adenocarcinoma with negative EGFR and ALK mutations diagnosed in January 2015 and presented with right upper lobe lung mass in addition to a solitary brain metastasis.  PRIOR THERAPY: 1) Status post stereotactic radiotherapy to a solitary right parietal brain lesion under the care of Dr. [name] on 10/16/2013. 2) Status post palliative radiotherapy to the right lung tumor under the care of Dr. [name] completed on 12/05/2013. 3) Systemic chemotherapy with carboplatin for AUC of 5 and Alimta 500 mg/M2 every 3 weeks. First dose Jan 06 2014. Status post 6 cycles.  CURRENT THERAPY: Systemic chemotherapy with maintenance Alimta 500 MG/M2 every 3 weeks, status post 38 cycles.  INTERVAL HISTORY: Alexis [name] 65 y.o. female returns to the clinic today for 3 weeks follow-up visit accompanied by her husband. The patient is feeling fine today with no specific complaints. She has been tolerating her treatment with maintenance Alimta fairly well with no significant adverse effects. She enjoyed her Christmas with her husband. She denied having any fever or chills. She has no nausea, vomiting, diarrhea or constipation. She denied having any chest pain, shortness of breath, cough or hemoptysis. She denied having any significant weight loss or night sweats. She had repeat CT scan of the chest, abdomen and pelvis performed recently and she is here for evaluation and discussion of her scan results.  MEDICAL HISTORY: Past Medical History:  Diagnosis Date  . Anxiety   . Anxiety 06/20/2016  . Cervical cancer ([facility])   . Encounter for antineoplastic chemotherapy 07/20/2015  . Malignant neoplasm of right upper lobe of lung ([facility])     non small cell lung cancer adenocarcioma with brain meta     ALLERGIES:  is allergic to codeine.  MEDICATIONS:  Current Outpatient Prescriptions  Medication Sig Dispense Refill  . acetaminophen (TYLENOL) 500 MG tablet Take 500 mg by mouth every 6 (six) hours as needed for mild pain or headache. Reported on 11/02/2015    . ALPRAZolam (XANAX) 0.25 MG tablet Take 1 tablet (0.25 mg total) by mouth 3 (three) times daily as needed. for anxiety 45 tablet 0  . Ascorbic Acid (VITAMIN C GUMMIE PO) Take 1 each by mouth every morning.    Marland Kitchen dexamethasone (DECADRON) 4 MG tablet TAKE 1 TABLET BY MOUTH TWICE A DAY THE DAY BEFORE, DAY OF AND DAY AFTER THE CHEMOTHERAPY EVERY 3 WKS 40 tablet 1  . folic acid (FOLVITE) 1 MG tablet Take 1 tablet (1 mg total) by mouth daily. 90 tablet 0  . Multiple Vitamin (MULTIVITAMIN WITH MINERALS) TABS tablet Take 1 tablet by mouth every morning.    Marland Kitchen omeprazole (PRILOSEC) 20 MG capsule Take 1 capsule (20 mg total) by mouth daily. As needed for reflux or indigestion. 42 capsule PRN  . ondansetron (ZOFRAN) 8 MG tablet Take 1 tablet (8 mg total) by mouth every 8 (eight) hours as needed for nausea or vomiting. 30 tablet 0  . OVER THE COUNTER MEDICATION Take 1 tablet by mouth every morning. (Vitamin A)    . pentoxifylline (TRENTAL) 400 MG CR tablet TAKE ONE TABLET BY MOUTH TWICE DAILY, ALSO  TAKE  VITAMIN  E  400  UNITS  TWICE  DAILY  WITH  PENTOXIFYLLINE 60 tablet 0  . prochlorperazine (COMPAZINE) 10 MG tablet Take 1 tablet (  10 mg total) by mouth every 6 (six) hours as needed for nausea or vomiting. 60 tablet 0  . senna-docusate (SENOKOT-S) 8.6-50 MG tablet Take 1 tablet by mouth daily. 30 tablet prn   No current facility-administered medications for this visit.     SURGICAL HISTORY:  Past Surgical History:  Procedure Laterality Date  . ABDOMINAL HYSTERECTOMY    . COLOSTOMY TAKEDOWN N/A 07/10/2014   Procedure: LAPAROSCOPIC LYSIS OF ADHESIONS (90 MIN) LAPAROSCOPIC ASSISTED COLOSTOMY CLOSURE, RIGID PROCTOSIGMOIDOSCOPY;  Surgeon: Jackolyn Confer, MD;  Location: [facility];  Service: General;  Laterality: N/A;  . LAPAROTOMY N/A 11/03/2013   Procedure: EXPLORATORY LAPAROTOMY, DRAINAGE OF INTRA  ABDOMINAL ABSCESSES, MOBILIZATION OF SPLENIC FLEXURE, SIGMOID COLECTOMY WITH COLOSTOMY;  Surgeon: Odis Hollingshead, MD;  Location: [facility];  Service: General;  Laterality: N/A;  . VIDEO BRONCHOSCOPY Bilateral 08/30/2013   Procedure: VIDEO BRONCHOSCOPY WITH FLUORO;  Surgeon: Tanda Rockers, MD;  Location: [facility];  Service: Cardiopulmonary;  Laterality: Bilateral;    REVIEW OF SYSTEMS:  Eyes: negative Ears, nose, mouth, throat, and face: negative Respiratory: negative Cardiovascular: negative Gastrointestinal: negative Genitourinary:negative Integument/breast: negative Hematologic/lymphatic: negative Musculoskeletal:negative Neurological: negative Behavioral/Psych: negative Endocrine: negative Allergic/Immunologic: negative   PHYSICAL EXAMINATION: General appearance: alert, cooperative and no distress Head: Normocephalic, without obvious abnormality, atraumatic Neck: no adenopathy, no JVD, supple, symmetrical, trachea midline and thyroid not enlarged, symmetric, no tenderness/mass/nodules Lymph nodes: Cervical, supraclavicular, and axillary nodes normal. Resp: clear to auscultation bilaterally Back: symmetric, no curvature. ROM normal. No CVA tenderness. Cardio: regular rate and rhythm, S1, S2 normal, no murmur, click, rub or gallop GI: soft, non-tender; bowel sounds normal; no masses,  no organomegaly Extremities: extremities normal, atraumatic, no cyanosis or edema Neurologic: Alert and oriented X 3, normal strength and tone. Normal symmetric reflexes. Normal coordination and gait  ECOG PERFORMANCE STATUS: 0 - Asymptomatic  Blood pressure (!) 112/55, pulse 73, temperature 97.9 F (36.6 C), temperature source Oral, resp. rate 18, height _0  (1.626 m), weight 165 lb 6.4 oz (75 kg), SpO2 100 %.  LABORATORY DATA: Lab Results   Component Value Date   WBC 6.8 08/23/2016   HGB 11.5 (L) 08/23/2016   HCT 34.1 (L) 08/23/2016   MCV 97.6 08/23/2016   PLT 354 08/23/2016      Chemistry      Component Value Date/Time   NA 140 08/01/2016 0857   K 3.7 08/01/2016 0857   CL 100 07/12/2014 0512   CO2 23 08/01/2016 0857   BUN 15.4 08/01/2016 0857   CREATININE 0.8 08/01/2016 0857      Component Value Date/Time   CALCIUM 9.3 08/01/2016 0857   ALKPHOS 103 08/01/2016 0857   AST 11 08/01/2016 0857   ALT 13 08/01/2016 0857   BILITOT 0.33 08/01/2016 0857       RADIOGRAPHIC STUDIES: Ct Chest W Contrast  Result Date: 08/18/2016 CLINICAL DATA:  Restaging of stage IV right lung cancer diagnosed in 2014. EXAM: CT CHEST, ABDOMEN, AND PELVIS WITH CONTRAST TECHNIQUE: Multidetector CT imaging of the chest, abdomen and pelvis was performed following the standard protocol during bolus administration of intravenous contrast. CONTRAST:  143m ISOVUE-300 IOPAMIDOL (ISOVUE-300) INJECTION 61% COMPARISON:  Multiple exams, including 06/17/2016 FINDINGS: CT CHEST FINDINGS Cardiovascular: Aortic arch and branch vessel atherosclerotic calcification. Mediastinum/Nodes: Right hilar lymph node 0.9 cm in short axis, previously 0.8 cm. Right infrahilar node 0.5 cm in short axis, stable. Lungs/Pleura: Severe emphysema. Right apical nodule 2.5 by 1.6 cm, by my measurements this is unchanged back through  07/17/2015. Surrounding ground-glass opacity and interstitial accentuation in the right upper lobe is primarily likely from radiation pneumonitis, but the ground-glass opacity does extend further peripherally at the right lung apex but was present previously. Left apical pleuroparenchymal scarring observed with some low-grade ground-glass opacity in the left upper lobe as well. Airway thickening is present, suggesting bronchitis or reactive airways disease. No new pulmonary nodule identified. Musculoskeletal: Thoracic spondylosis. CT ABDOMEN PELVIS FINDINGS  Hepatobiliary: Unremarkable Pancreas: Unremarkable Spleen: Unremarkable Adrenals/Urinary Tract: Unremarkable Stomach/Bowel: Postoperative findings at the rectosigmoid junction. Vascular/Lymphatic: Aortoiliac atherosclerotic vascular disease. No pathologic adenopathy identified. Reproductive: Uterus absent.  Adnexa unremarkable. Other: No supplemental non-categorized findings. Musculoskeletal: Small umbilical hernia contains adipose tissue. IMPRESSION: 1. Stable treated nodule at the right lung apex. There is some increase in biapical ground-glass opacities noted, some of this is likely radiation therapy related on the right at there could be a component of mild alveolitis at the lung apices. 2. Emphysema. 3. Airway thickening is present, suggesting bronchitis or reactive airways disease. 4. No findings of metastatic disease to the abdomen or pelvis. 5. Atherosclerosis. 6. Small umbilical hernia contains adipose tissue. Electronically Signed   By: Van Clines M.D.   On: 08/18/2016 12:27   Ct Abdomen Pelvis W Contrast  Result Date: 08/18/2016 CLINICAL DATA:  Restaging of stage IV right lung cancer diagnosed in 2014. EXAM: CT CHEST, ABDOMEN, AND PELVIS WITH CONTRAST TECHNIQUE: Multidetector CT imaging of the chest, abdomen and pelvis was performed following the standard protocol during bolus administration of intravenous contrast. CONTRAST:  163m ISOVUE-300 IOPAMIDOL (ISOVUE-300) INJECTION 61% COMPARISON:  Multiple exams, including 06/17/2016 FINDINGS: CT CHEST FINDINGS Cardiovascular: Aortic arch and branch vessel atherosclerotic calcification. Mediastinum/Nodes: Right hilar lymph node 0.9 cm in short axis, previously 0.8 cm. Right infrahilar node 0.5 cm in short axis, stable. Lungs/Pleura: Severe emphysema. Right apical nodule 2.5 by 1.6 cm, by my measurements this is unchanged back through 07/17/2015. Surrounding ground-glass opacity and interstitial accentuation in the right upper lobe is primarily  likely from radiation pneumonitis, but the ground-glass opacity does extend further peripherally at the right lung apex but was present previously. Left apical pleuroparenchymal scarring observed with some low-grade ground-glass opacity in the left upper lobe as well. Airway thickening is present, suggesting bronchitis or reactive airways disease. No new pulmonary nodule identified. Musculoskeletal: Thoracic spondylosis. CT ABDOMEN PELVIS FINDINGS Hepatobiliary: Unremarkable Pancreas: Unremarkable Spleen: Unremarkable Adrenals/Urinary Tract: Unremarkable Stomach/Bowel: Postoperative findings at the rectosigmoid junction. Vascular/Lymphatic: Aortoiliac atherosclerotic vascular disease. No pathologic adenopathy identified. Reproductive: Uterus absent.  Adnexa unremarkable. Other: No supplemental non-categorized findings. Musculoskeletal: Small umbilical hernia contains adipose tissue. IMPRESSION: 1. Stable treated nodule at the right lung apex. There is some increase in biapical ground-glass opacities noted, some of this is likely radiation therapy related on the right at there could be a component of mild alveolitis at the lung apices. 2. Emphysema. 3. Airway thickening is present, suggesting bronchitis or reactive airways disease. 4. No findings of metastatic disease to the abdomen or pelvis. 5. Atherosclerosis. 6. Small umbilical hernia contains adipose tissue. Electronically Signed   By: WVan ClinesM.D.   On: 08/18/2016 12:27    ASSESSMENT AND PLAN: This is a very pleasant 65years old white female with: 1) stage IV non-small cell lung cancer with solitary brain metastasis status post induction systemic chemotherapy and currently on maintenance treatment with single agent Alimta status post 38 cycles. The patient had recent CT scan of the chest, abdomen and pelvis. I personally and independently reviewed the  scan and discuss the results with the patient and her husband. The scan did not show any  evidence for disease progression. I recommended for the patient to continue her treatment with maintenance Alimta with cycle #39 today. She will come back for follow-up visit in 3 weeks for evaluation with the start of cycle #40.  2) chemotherapy-induced anemia: Her hemoglobin and hematocrit are low but stable. We will continue to monitor for now.  3) anxiety: I gave the patient refill of Xanax today.  She was advised to call immediately if she has any concerning symptoms in the interval. The patient voices understanding of current disease status and treatment options and is in agreement with the current care plan.  All questions were answered. The patient knows to call the clinic with any problems, questions or concerns. We can certainly see the patient much sooner if necessary.  Disclaimer: This note was dictated with voice recognition software. Similar sounding words can inadvertently be transcribed and may not be corrected upon review.

## 2016-09-12 ENCOUNTER — Encounter: Payer: Self-pay | Admitting: Internal Medicine

## 2016-09-12 ENCOUNTER — Ambulatory Visit (HOSPITAL_BASED_OUTPATIENT_CLINIC_OR_DEPARTMENT_OTHER): Payer: BLUE CROSS/BLUE SHIELD | Admitting: Internal Medicine

## 2016-09-12 ENCOUNTER — Ambulatory Visit (HOSPITAL_BASED_OUTPATIENT_CLINIC_OR_DEPARTMENT_OTHER): Payer: BLUE CROSS/BLUE SHIELD

## 2016-09-12 ENCOUNTER — Other Ambulatory Visit (HOSPITAL_BASED_OUTPATIENT_CLINIC_OR_DEPARTMENT_OTHER): Payer: BLUE CROSS/BLUE SHIELD

## 2016-09-12 VITALS — BP 102/59 | HR 80 | Temp 97.7°F | Resp 16 | Ht 64.0 in | Wt 163.6 lb

## 2016-09-12 DIAGNOSIS — T451X5A Adverse effect of antineoplastic and immunosuppressive drugs, initial encounter: Secondary | ICD-10-CM

## 2016-09-12 DIAGNOSIS — Z5111 Encounter for antineoplastic chemotherapy: Secondary | ICD-10-CM | POA: Diagnosis not present

## 2016-09-12 DIAGNOSIS — F419 Anxiety disorder, unspecified: Secondary | ICD-10-CM | POA: Diagnosis not present

## 2016-09-12 DIAGNOSIS — C3411 Malignant neoplasm of upper lobe, right bronchus or lung: Secondary | ICD-10-CM

## 2016-09-12 DIAGNOSIS — D6481 Anemia due to antineoplastic chemotherapy: Secondary | ICD-10-CM

## 2016-09-12 DIAGNOSIS — C7931 Secondary malignant neoplasm of brain: Secondary | ICD-10-CM | POA: Diagnosis not present

## 2016-09-12 LAB — COMPREHENSIVE METABOLIC PANEL
ALT: 13 U/L (ref 0–55)
AST: 13 U/L (ref 5–34)
Albumin: 3.4 g/dL — ABNORMAL LOW (ref 3.5–5.0)
Alkaline Phosphatase: 100 U/L (ref 40–150)
Anion Gap: 11 mEq/L (ref 3–11)
BUN: 11 mg/dL (ref 7.0–26.0)
CO2: 26 mEq/L (ref 22–29)
Calcium: 9.7 mg/dL (ref 8.4–10.4)
Chloride: 106 mEq/L (ref 98–109)
Creatinine: 0.8 mg/dL (ref 0.6–1.1)
EGFR: 82 mL/min/{1.73_m2} — ABNORMAL LOW (ref >90)
Glucose: 122 mg/dl (ref 70–140)
Potassium: 3.9 mEq/L (ref 3.5–5.1)
Sodium: 143 mEq/L (ref 136–145)
Total Bilirubin: 0.4 mg/dL (ref 0.20–1.20)
Total Protein: 7.1 g/dL (ref 6.4–8.3)

## 2016-09-12 LAB — CBC WITH DIFFERENTIAL/PLATELET
BASO%: 0.2 % (ref 0.0–2.0)
Basophils Absolute: 0 10*3/uL (ref 0.0–0.1)
EOS%: 0 % (ref 0.0–7.0)
Eosinophils Absolute: 0 10*3/uL (ref 0.0–0.5)
HCT: 35.8 % (ref 34.8–46.6)
HGB: 11.8 g/dL (ref 11.6–15.9)
LYMPH%: 4.4 % — ABNORMAL LOW (ref 14.0–49.7)
MCH: 32.4 pg (ref 25.1–34.0)
MCHC: 33 g/dL (ref 31.5–36.0)
MCV: 98 fL (ref 79.5–101.0)
MONO#: 0.3 10*3/uL (ref 0.1–0.9)
MONO%: 4.3 % (ref 0.0–14.0)
NEUT#: 6.2 10*3/uL (ref 1.5–6.5)
NEUT%: 91.1 % — ABNORMAL HIGH (ref 38.4–76.8)
Platelets: 304 10*3/uL (ref 145–400)
RBC: 3.66 10*6/uL — ABNORMAL LOW (ref 3.70–5.45)
RDW: 13.9 % (ref 11.2–14.5)
WBC: 6.8 10*3/uL (ref 3.9–10.3)
lymph#: 0.3 10*3/uL — ABNORMAL LOW (ref 0.9–3.3)

## 2016-09-12 MED ORDER — DEXAMETHASONE SODIUM PHOSPHATE 10 MG/ML IJ SOLN
10.0000 mg | Freq: Once | INTRAMUSCULAR | Status: AC
  Administered 2016-09-12: 10 mg via INTRAVENOUS

## 2016-09-12 MED ORDER — SODIUM CHLORIDE 0.9 % IV SOLN
Freq: Once | INTRAVENOUS | Status: AC
  Administered 2016-09-12: 14:00:00 via INTRAVENOUS

## 2016-09-12 MED ORDER — ONDANSETRON HCL 8 MG PO TABS
8.0000 mg | ORAL_TABLET | Freq: Once | ORAL | Status: AC
  Administered 2016-09-12: 8 mg via ORAL

## 2016-09-12 MED ORDER — SODIUM CHLORIDE 0.9 % IV SOLN
500.0000 mg/m2 | Freq: Once | INTRAVENOUS | Status: AC
  Administered 2016-09-12: 900 mg via INTRAVENOUS
  Filled 2016-09-12: qty 20

## 2016-09-12 MED ORDER — DEXAMETHASONE SODIUM PHOSPHATE 10 MG/ML IJ SOLN
INTRAMUSCULAR | Status: AC
  Filled 2016-09-12: qty 1

## 2016-09-12 MED ORDER — SODIUM CHLORIDE 0.9 % IV SOLN: Freq: Once | INTRAVENOUS | Status: DC

## 2016-09-12 MED ORDER — ONDANSETRON HCL 8 MG PO TABS
ORAL_TABLET | ORAL | Status: AC
  Filled 2016-09-12: qty 1

## 2016-09-12 NOTE — Progress Notes (Signed)
[place] [phone]   Fax:([phone]) [place], MD [address] [place] [address]  DIAGNOSIS: Stage IV (T2a, N0, M1b) non-small cell lung cancer, adenocarcinoma with negative EGFR and ALK mutations diagnosed in January 2015 and presented with right upper lobe lung mass in addition to a solitary brain metastasis.  PRIOR THERAPY: 1) Status post stereotactic radiotherapy to a solitary right parietal brain lesion under the care of Dr. [name] on 10/16/2013. 2) Status post palliative radiotherapy to the right lung tumor under the care of Dr. [name] completed on 12/05/2013. 3) Systemic chemotherapy with carboplatin for AUC of 5 and Alimta 500 mg/M2 every 3 weeks. First dose Jan 06 2014. Status post 6 cycles.  CURRENT THERAPY: Systemic chemotherapy with maintenance Alimta 500 MG/M2 every 3 weeks, status post 39 cycles.  INTERVAL HISTORY: Alexis [name] 66 y.o. female came to the clinic today accompanied by her husband for follow-up visit. The patient is currently on maintenance treatment with Alimta 500 MG/M2 every 3 weeks status post 39 cycles and tolerating her treatment well. She denied having any chest pain, shortness of breath, cough or hemoptysis. She has no weight loss or night sweats. She has no nausea, vomiting, diarrhea or constipation. She has no fever or chills. She is here today for evaluation before starting cycle #40 of her treatment.  MEDICAL HISTORY: Past Medical History:  Diagnosis Date  . Anxiety   . Anxiety 06/20/2016  . Cervical cancer ([place])   . Encounter for antineoplastic chemotherapy 07/20/2015  . 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  MEDICATIONS:  Current Outpatient Prescriptions  Medication Sig Dispense Refill  . acetaminophen (TYLENOL) 500 MG tablet Take 500 mg by mouth every 6  (six) hours as needed for mild pain or headache. Reported on 11/02/2015    . ALPRAZolam (XANAX) 0.25 MG tablet Take 1 tablet (0.25 mg total) by mouth 3 (three) times daily as needed. for anxiety 45 tablet 0  . Ascorbic Acid (VITAMIN C GUMMIE PO) Take 1 each by mouth every morning.    Marland Kitchen dexamethasone (DECADRON) 4 MG tablet TAKE 1 TABLET BY MOUTH TWICE A DAY THE DAY BEFORE, DAY OF AND DAY AFTER THE CHEMOTHERAPY EVERY 3 WKS 40 tablet 1  . folic acid (FOLVITE) 1 MG tablet Take 1 tablet (1 mg total) by mouth daily. 90 tablet 0  . Multiple Vitamin (MULTIVITAMIN WITH MINERALS) TABS tablet Take 1 tablet by mouth every morning.    Marland Kitchen omeprazole (PRILOSEC) 20 MG capsule Take 1 capsule (20 mg total) by mouth daily. As needed for reflux or indigestion. 42 capsule PRN  . ondansetron (ZOFRAN) 8 MG tablet Take 1 tablet (8 mg total) by mouth every 8 (eight) hours as needed for nausea or vomiting. 30 tablet 0  . OVER THE COUNTER MEDICATION Take 1 tablet by mouth every morning. (Vitamin A)    . pentoxifylline (TRENTAL) 400 MG CR tablet TAKE ONE TABLET BY MOUTH TWICE DAILY, ALSO  TAKE  VITAMIN  E  400  UNITS  TWICE  DAILY  WITH  PENTOXIFYLLINE 60 tablet 0  . prochlorperazine (COMPAZINE) 10 MG tablet Take 1 tablet (10 mg total) by mouth every 6 (six) hours as needed for nausea or vomiting. 60 tablet 0  . senna-docusate (SENOKOT-S) 8.6-50 MG tablet Take 1  tablet by mouth daily. (Patient not taking: Reported on 09/12/2016) 30 tablet prn   No current facility-administered medications for this visit.     SURGICAL HISTORY:  Past Surgical History:  Procedure Laterality Date  . ABDOMINAL HYSTERECTOMY    . COLOSTOMY TAKEDOWN N/A 07/10/2014   Procedure: LAPAROSCOPIC LYSIS OF ADHESIONS (90 MIN) LAPAROSCOPIC ASSISTED COLOSTOMY CLOSURE, RIGID PROCTOSIGMOIDOSCOPY;  Surgeon: Jackolyn Confer, MD;  Location: [facility];  Service: General;  Laterality: N/A;  . LAPAROTOMY N/A 11/03/2013   Procedure: EXPLORATORY LAPAROTOMY, DRAINAGE OF INTRA   ABDOMINAL ABSCESSES, MOBILIZATION OF SPLENIC FLEXURE, SIGMOID COLECTOMY WITH COLOSTOMY;  Surgeon: Odis Hollingshead, MD;  Location: [facility];  Service: General;  Laterality: N/A;  . VIDEO BRONCHOSCOPY Bilateral 08/30/2013   Procedure: VIDEO BRONCHOSCOPY WITH FLUORO;  Surgeon: Tanda Rockers, MD;  Location: [facility];  Service: Cardiopulmonary;  Laterality: Bilateral;    REVIEW OF SYSTEMS:  A comprehensive review of systems was negative.   PHYSICAL EXAMINATION: General appearance: alert, cooperative and no distress Head: Normocephalic, without obvious abnormality, atraumatic Neck: no adenopathy, no JVD, supple, symmetrical, trachea midline and thyroid not enlarged, symmetric, no tenderness/mass/nodules Lymph nodes: Cervical, supraclavicular, and axillary nodes normal. Resp: clear to auscultation bilaterally Back: symmetric, no curvature. ROM normal. No CVA tenderness. Cardio: regular rate and rhythm, S1, S2 normal, no murmur, click, rub or gallop GI: soft, non-tender; bowel sounds normal; no masses,  no organomegaly Extremities: extremities normal, atraumatic, no cyanosis or edema  ECOG PERFORMANCE STATUS: 0 - Asymptomatic  Blood pressure (!) 102/59, pulse 80, temperature 97.7 F (36.5 C), temperature source Oral, resp. rate 16, height 5' 4"  (1.626 m), weight 163 lb 9.6 oz (74.2 kg), SpO2 100 %.  LABORATORY DATA: Lab Results  Component Value Date   WBC 6.8 09/12/2016   HGB 11.8 09/12/2016   HCT 35.8 09/12/2016   MCV 98.0 09/12/2016   PLT 304 09/12/2016      Chemistry      Component Value Date/Time   NA 143 09/12/2016 1135   K 3.9 09/12/2016 1135   CL 100 07/12/2014 0512   CO2 26 09/12/2016 1135   BUN 11.0 09/12/2016 1135   CREATININE 0.8 09/12/2016 1135      Component Value Date/Time   CALCIUM 9.7 09/12/2016 1135   ALKPHOS 100 09/12/2016 1135   AST 13 09/12/2016 1135   ALT 13 09/12/2016 1135   BILITOT 0.40 09/12/2016 1135       RADIOGRAPHIC STUDIES: Ct Chest W  Contrast  Result Date: 08/18/2016 CLINICAL DATA:  Restaging of stage IV right lung cancer diagnosed in 2014. EXAM: CT CHEST, ABDOMEN, AND PELVIS WITH CONTRAST TECHNIQUE: Multidetector CT imaging of the chest, abdomen and pelvis was performed following the standard protocol during bolus administration of intravenous contrast. CONTRAST:  131m ISOVUE-300 IOPAMIDOL (ISOVUE-300) INJECTION 61% COMPARISON:  Multiple exams, including 06/17/2016 FINDINGS: CT CHEST FINDINGS Cardiovascular: Aortic arch and branch vessel atherosclerotic calcification. Mediastinum/Nodes: Right hilar lymph node 0.9 cm in short axis, previously 0.8 cm. Right infrahilar node 0.5 cm in short axis, stable. Lungs/Pleura: Severe emphysema. Right apical nodule 2.5 by 1.6 cm, by my measurements this is unchanged back through 07/17/2015. Surrounding ground-glass opacity and interstitial accentuation in the right upper lobe is primarily likely from radiation pneumonitis, but the ground-glass opacity does extend further peripherally at the right lung apex but was present previously. Left apical pleuroparenchymal scarring observed with some low-grade ground-glass opacity in the left upper lobe as well. Airway thickening is present, suggesting bronchitis or reactive airways disease.  No new pulmonary nodule identified. Musculoskeletal: Thoracic spondylosis. CT ABDOMEN PELVIS FINDINGS Hepatobiliary: Unremarkable Pancreas: Unremarkable Spleen: Unremarkable Adrenals/Urinary Tract: Unremarkable Stomach/Bowel: Postoperative findings at the rectosigmoid junction. Vascular/Lymphatic: Aortoiliac atherosclerotic vascular disease. No pathologic adenopathy identified. Reproductive: Uterus absent.  Adnexa unremarkable. Other: No supplemental non-categorized findings. Musculoskeletal: Small umbilical hernia contains adipose tissue. IMPRESSION: 1. Stable treated nodule at the right lung apex. There is some increase in biapical ground-glass opacities noted, some of this  is likely radiation therapy related on the right at there could be a component of mild alveolitis at the lung apices. 2. Emphysema. 3. Airway thickening is present, suggesting bronchitis or reactive airways disease. 4. No findings of metastatic disease to the abdomen or pelvis. 5. Atherosclerosis. 6. Small umbilical hernia contains adipose tissue. Electronically Signed   By: Van Clines M.D.   On: 08/18/2016 12:27   Ct Abdomen Pelvis W Contrast  Result Date: 08/18/2016 CLINICAL DATA:  Restaging of stage IV right lung cancer diagnosed in 2014. EXAM: CT CHEST, ABDOMEN, AND PELVIS WITH CONTRAST TECHNIQUE: Multidetector CT imaging of the chest, abdomen and pelvis was performed following the standard protocol during bolus administration of intravenous contrast. CONTRAST:  178m ISOVUE-300 IOPAMIDOL (ISOVUE-300) INJECTION 61% COMPARISON:  Multiple exams, including 06/17/2016 FINDINGS: CT CHEST FINDINGS Cardiovascular: Aortic arch and branch vessel atherosclerotic calcification. Mediastinum/Nodes: Right hilar lymph node 0.9 cm in short axis, previously 0.8 cm. Right infrahilar node 0.5 cm in short axis, stable. Lungs/Pleura: Severe emphysema. Right apical nodule 2.5 by 1.6 cm, by my measurements this is unchanged back through 07/17/2015. Surrounding ground-glass opacity and interstitial accentuation in the right upper lobe is primarily likely from radiation pneumonitis, but the ground-glass opacity does extend further peripherally at the right lung apex but was present previously. Left apical pleuroparenchymal scarring observed with some low-grade ground-glass opacity in the left upper lobe as well. Airway thickening is present, suggesting bronchitis or reactive airways disease. No new pulmonary nodule identified. Musculoskeletal: Thoracic spondylosis. CT ABDOMEN PELVIS FINDINGS Hepatobiliary: Unremarkable Pancreas: Unremarkable Spleen: Unremarkable Adrenals/Urinary Tract: Unremarkable Stomach/Bowel:  Postoperative findings at the rectosigmoid junction. Vascular/Lymphatic: Aortoiliac atherosclerotic vascular disease. No pathologic adenopathy identified. Reproductive: Uterus absent.  Adnexa unremarkable. Other: No supplemental non-categorized findings. Musculoskeletal: Small umbilical hernia contains adipose tissue. IMPRESSION: 1. Stable treated nodule at the right lung apex. There is some increase in biapical ground-glass opacities noted, some of this is likely radiation therapy related on the right at there could be a component of mild alveolitis at the lung apices. 2. Emphysema. 3. Airway thickening is present, suggesting bronchitis or reactive airways disease. 4. No findings of metastatic disease to the abdomen or pelvis. 5. Atherosclerosis. 6. Small umbilical hernia contains adipose tissue. Electronically Signed   By: WVan ClinesM.D.   On: 08/18/2016 12:27    ASSESSMENT AND PLAN:  This is a very pleasant 66years old white female with metastatic non-small cell lung cancer currently on treatment with maintenance Alimta 500 MG/M2 every 3 weeks status post 39 cycles. The patient is tolerating her treatment well with no concerning complaints. I recommended for her to proceed with cycle #40 today as scheduled. She will come back for follow-up visit in 3 weeks for evaluation before starting cycle #41. She was advised to call immediately if she has any concerning symptoms in the interval. For anxiety, she will continue her treatment with Xanax. The patient voices understanding of current disease status and treatment options and is in agreement with the current care plan.  All questions were answered.  The patient knows to call the clinic with any problems, questions or concerns. We can certainly see the patient much sooner if necessary. I spent 10 minutes counseling the patient face to face. The total time spent in the appointment was 15 minutes.  Disclaimer: This note was dictated with voice  recognition software. Similar sounding words can inadvertently be transcribed and may not be corrected upon review.

## 2016-09-12 NOTE — Patient Instructions (Signed)
[facility] Discharge Instructions for Patients Receiving Chemotherapy  Today you received the following chemotherapy agents; Alimta.   To help prevent nausea and vomiting after your treatment, we encourage you to take your nausea medication as directed.    If you develop nausea and vomiting that is not controlled by your nausea medication, call the clinic.   BELOW ARE SYMPTOMS THAT SHOULD BE REPORTED IMMEDIATELY:  *FEVER GREATER THAN 100.5 F  *CHILLS WITH OR WITHOUT FEVER  NAUSEA AND VOMITING THAT IS NOT CONTROLLED WITH YOUR NAUSEA MEDICATION  *UNUSUAL SHORTNESS OF BREATH  *UNUSUAL BRUISING OR BLEEDING  TENDERNESS IN MOUTH AND THROAT WITH OR WITHOUT PRESENCE OF ULCERS  *URINARY PROBLEMS  *BOWEL PROBLEMS  UNUSUAL RASH Items with * indicate a potential emergency and should be followed up as soon as possible.  Feel free to call the clinic you have any questions or concerns. The clinic phone number is [phone].  Please show the CHEMO ALERT CARD at check-in to the Emergency Department and triage nurse.   

## 2016-09-13 ENCOUNTER — Ambulatory Visit: Payer: BLUE CROSS/BLUE SHIELD | Admitting: Internal Medicine

## 2016-09-13 ENCOUNTER — Other Ambulatory Visit: Payer: BLUE CROSS/BLUE SHIELD

## 2016-09-13 ENCOUNTER — Ambulatory Visit: Payer: BLUE CROSS/BLUE SHIELD

## 2016-10-03 ENCOUNTER — Telehealth: Payer: Self-pay | Admitting: Internal Medicine

## 2016-10-03 ENCOUNTER — Ambulatory Visit (HOSPITAL_BASED_OUTPATIENT_CLINIC_OR_DEPARTMENT_OTHER): Payer: Medicare Other

## 2016-10-03 ENCOUNTER — Encounter: Payer: Self-pay | Admitting: Internal Medicine

## 2016-10-03 ENCOUNTER — Ambulatory Visit (HOSPITAL_BASED_OUTPATIENT_CLINIC_OR_DEPARTMENT_OTHER): Payer: Medicare Other | Admitting: Internal Medicine

## 2016-10-03 ENCOUNTER — Telehealth: Payer: Self-pay | Admitting: *Deleted

## 2016-10-03 ENCOUNTER — Other Ambulatory Visit (HOSPITAL_BASED_OUTPATIENT_CLINIC_OR_DEPARTMENT_OTHER): Payer: Medicare Other

## 2016-10-03 DIAGNOSIS — C3411 Malignant neoplasm of upper lobe, right bronchus or lung: Secondary | ICD-10-CM

## 2016-10-03 DIAGNOSIS — F419 Anxiety disorder, unspecified: Secondary | ICD-10-CM | POA: Diagnosis not present

## 2016-10-03 DIAGNOSIS — Z5111 Encounter for antineoplastic chemotherapy: Secondary | ICD-10-CM | POA: Diagnosis present

## 2016-10-03 DIAGNOSIS — C7931 Secondary malignant neoplasm of brain: Secondary | ICD-10-CM | POA: Diagnosis not present

## 2016-10-03 DIAGNOSIS — C7949 Secondary malignant neoplasm of other parts of nervous system: Secondary | ICD-10-CM

## 2016-10-03 DIAGNOSIS — F411 Generalized anxiety disorder: Secondary | ICD-10-CM

## 2016-10-03 LAB — CBC WITH DIFFERENTIAL/PLATELET
BASO%: 0 % (ref 0.0–2.0)
Basophils Absolute: 0 10*3/uL (ref 0.0–0.1)
EOS%: 0 % (ref 0.0–7.0)
Eosinophils Absolute: 0 10*3/uL (ref 0.0–0.5)
HCT: 36.8 % (ref 34.8–46.6)
HGB: 11.5 g/dL — ABNORMAL LOW (ref 11.6–15.9)
LYMPH%: 4.1 % — ABNORMAL LOW (ref 14.0–49.7)
MCH: 31.2 pg (ref 25.1–34.0)
MCHC: 31.3 g/dL — ABNORMAL LOW (ref 31.5–36.0)
MCV: 99.7 fL (ref 79.5–101.0)
MONO#: 0.6 10*3/uL (ref 0.1–0.9)
MONO%: 7.2 % (ref 0.0–14.0)
NEUT#: 7.1 10*3/uL — ABNORMAL HIGH (ref 1.5–6.5)
NEUT%: 88.7 % — ABNORMAL HIGH (ref 38.4–76.8)
Platelets: 318 10*3/uL (ref 145–400)
RBC: 3.69 10*6/uL — ABNORMAL LOW (ref 3.70–5.45)
RDW: 14.9 % — ABNORMAL HIGH (ref 11.2–14.5)
WBC: 8 10*3/uL (ref 3.9–10.3)
lymph#: 0.3 10*3/uL — ABNORMAL LOW (ref 0.9–3.3)

## 2016-10-03 LAB — COMPREHENSIVE METABOLIC PANEL
ALT: 15 U/L (ref 0–55)
AST: 11 U/L (ref 5–34)
Albumin: 3.6 g/dL (ref 3.5–5.0)
Alkaline Phosphatase: 99 U/L (ref 40–150)
Anion Gap: 10 mEq/L (ref 3–11)
BUN: 14.6 mg/dL (ref 7.0–26.0)
CO2: 22 mEq/L (ref 22–29)
Calcium: 9.6 mg/dL (ref 8.4–10.4)
Chloride: 109 mEq/L (ref 98–109)
Creatinine: 0.8 mg/dL (ref 0.6–1.1)
EGFR: 80 mL/min/{1.73_m2} — ABNORMAL LOW (ref >90)
Glucose: 120 mg/dl (ref 70–140)
Potassium: 4.2 mEq/L (ref 3.5–5.1)
Sodium: 141 mEq/L (ref 136–145)
Total Bilirubin: 0.32 mg/dL (ref 0.20–1.20)
Total Protein: 7.2 g/dL (ref 6.4–8.3)

## 2016-10-03 MED ORDER — ALPRAZOLAM 0.25 MG PO TABS: 0.2500 mg | ORAL_TABLET | Freq: Three times a day (TID) | ORAL | 0 refills | Status: DC | PRN

## 2016-10-03 MED ORDER — SODIUM CHLORIDE 0.9 % IV SOLN
500.0000 mg/m2 | Freq: Once | INTRAVENOUS | Status: AC
  Administered 2016-10-03: 900 mg via INTRAVENOUS
  Filled 2016-10-03: qty 20

## 2016-10-03 MED ORDER — SODIUM CHLORIDE 0.9 % IV SOLN
Freq: Once | INTRAVENOUS | Status: AC
  Administered 2016-10-03: 11:00:00 via INTRAVENOUS

## 2016-10-03 NOTE — Telephone Encounter (Signed)
Appointments scheduled per 2/5 LOS. Patient given AVS report and calendars with future scheduled appointments. As well patient given two bottles of contrast and instructions.

## 2016-10-03 NOTE — Progress Notes (Signed)
[place] [phone]   Fax:(336) [place], MD [address] [place] [address]  DIAGNOSIS: Stage IV (T2a, N0, M1b) non-small cell lung cancer, adenocarcinoma with negative EGFR and ALK mutations diagnosed in January 2015 and presented with right upper lobe lung mass in addition to a solitary brain metastasis.  PRIOR THERAPY: 1) Status post stereotactic radiotherapy to a solitary right parietal brain lesion under the care of Dr. [name] on 10/16/2013. 2) Status post palliative radiotherapy to the right lung tumor under the care of Dr. [name] completed on 12/05/2013. 3) Systemic chemotherapy with carboplatin for AUC of 5 and Alimta 500 mg/M2 every 3 weeks. First dose Jan 06 2014. Status post 6 cycles.  CURRENT THERAPY: Systemic chemotherapy with maintenance Alimta 500 MG/M2 every 3 weeks, status post 40 cycles.  INTERVAL HISTORY: Alexis [name] 66 y.o. female came to the clinic today for follow-up visit. The patient is feeling fine with no specific complaints. She is tolerating her current systemic chemotherapy with Alimta fairly well. She denied having any chest pain, shortness of breath, cough or hemoptysis. She has no nausea, vomiting, diarrhea or constipation. She denied having any fever or chills. The patient denied having any significant weight loss or night sweats. She is here today for evaluation before starting cycle #41.  MEDICAL HISTORY: Past Medical History:  Diagnosis Date  . Anxiety   . Anxiety 06/20/2016  . Cervical cancer ([place])   . Encounter for antineoplastic chemotherapy 07/20/2015  . 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  MEDICATIONS:  Current Outpatient Prescriptions  Medication Sig Dispense Refill  . acetaminophen (TYLENOL) 500 MG tablet Take 500 mg by mouth every 6 (six) hours as  needed for mild pain or headache. Reported on 11/02/2015    . ALPRAZolam (XANAX) 0.25 MG tablet Take 1 tablet (0.25 mg total) by mouth 3 (three) times daily as needed. for anxiety 45 tablet 0  . Ascorbic Acid (VITAMIN C GUMMIE PO) Take 1 each by mouth every morning.    Marland Kitchen dexamethasone (DECADRON) 4 MG tablet TAKE 1 TABLET BY MOUTH TWICE A DAY THE DAY BEFORE, DAY OF AND DAY AFTER THE CHEMOTHERAPY EVERY 3 WKS 40 tablet 1  . folic acid (FOLVITE) 1 MG tablet Take 1 tablet (1 mg total) by mouth daily. 90 tablet 0  . Multiple Vitamin (MULTIVITAMIN WITH MINERALS) TABS tablet Take 1 tablet by mouth every morning.    Marland Kitchen omeprazole (PRILOSEC) 20 MG capsule Take 1 capsule (20 mg total) by mouth daily. As needed for reflux or indigestion. 42 capsule PRN  . ondansetron (ZOFRAN) 8 MG tablet Take 1 tablet (8 mg total) by mouth every 8 (eight) hours as needed for nausea or vomiting. 30 tablet 0  . OVER THE COUNTER MEDICATION Take 1 tablet by mouth every morning. (Vitamin A)    . pentoxifylline (TRENTAL) 400 MG CR tablet TAKE ONE TABLET BY MOUTH TWICE DAILY, ALSO  TAKE  VITAMIN  E  400  UNITS  TWICE  DAILY  WITH  PENTOXIFYLLINE 60 tablet 0  . prochlorperazine (COMPAZINE) 10 MG tablet Take 1 tablet (10 mg total) by mouth every 6 (six) hours as needed for nausea or vomiting. 60 tablet 0  . senna-docusate (SENOKOT-S) 8.6-50 MG tablet Take 1 tablet by mouth daily. 30 tablet  prn   No current facility-administered medications for this visit.     SURGICAL HISTORY:  Past Surgical History:  Procedure Laterality Date  . ABDOMINAL HYSTERECTOMY    . COLOSTOMY TAKEDOWN N/A 07/10/2014   Procedure: LAPAROSCOPIC LYSIS OF ADHESIONS (90 MIN) LAPAROSCOPIC ASSISTED COLOSTOMY CLOSURE, RIGID PROCTOSIGMOIDOSCOPY;  Surgeon: Jackolyn Confer, MD;  Location: [facility];  Service: General;  Laterality: N/A;  . LAPAROTOMY N/A 11/03/2013   Procedure: EXPLORATORY LAPAROTOMY, DRAINAGE OF INTRA  ABDOMINAL ABSCESSES, MOBILIZATION OF SPLENIC FLEXURE,  SIGMOID COLECTOMY WITH COLOSTOMY;  Surgeon: Odis Hollingshead, MD;  Location: [facility];  Service: General;  Laterality: N/A;  . VIDEO BRONCHOSCOPY Bilateral 08/30/2013   Procedure: VIDEO BRONCHOSCOPY WITH FLUORO;  Surgeon: Tanda Rockers, MD;  Location: [facility];  Service: Cardiopulmonary;  Laterality: Bilateral;    REVIEW OF SYSTEMS:  A comprehensive review of systems was negative.   PHYSICAL EXAMINATION: General appearance: alert, cooperative and no distress Head: Normocephalic, without obvious abnormality, atraumatic Neck: no adenopathy, no JVD, supple, symmetrical, trachea midline and thyroid not enlarged, symmetric, no tenderness/mass/nodules Lymph nodes: Cervical, supraclavicular, and axillary nodes normal. Resp: clear to auscultation bilaterally Back: symmetric, no curvature. ROM normal. No CVA tenderness. Cardio: regular rate and rhythm, S1, S2 normal, no murmur, click, rub or gallop GI: soft, non-tender; bowel sounds normal; no masses,  no organomegaly Extremities: extremities normal, atraumatic, no cyanosis or edema  ECOG PERFORMANCE STATUS: 0 - Asymptomatic  Blood pressure (!) 110/55, pulse 72, temperature 98 F (36.7 C), temperature source Oral, resp. rate 18, height 5' 4"  (1.626 m), weight 163 lb 4.8 oz (74.1 kg), SpO2 100 %.  LABORATORY DATA: Lab Results  Component Value Date   WBC 8.0 10/03/2016   HGB 11.5 (L) 10/03/2016   HCT 36.8 10/03/2016   MCV 99.7 10/03/2016   PLT 318 10/03/2016      Chemistry      Component Value Date/Time   NA 143 09/12/2016 1135   K 3.9 09/12/2016 1135   CL 100 07/12/2014 0512   CO2 26 09/12/2016 1135   BUN 11.0 09/12/2016 1135   CREATININE 0.8 09/12/2016 1135      Component Value Date/Time   CALCIUM 9.7 09/12/2016 1135   ALKPHOS 100 09/12/2016 1135   AST 13 09/12/2016 1135   ALT 13 09/12/2016 1135   BILITOT 0.40 09/12/2016 1135       RADIOGRAPHIC STUDIES: No results found.  ASSESSMENT AND PLAN:  This very pleasant 66  years old white female with metastatic non-small cell lung cancer status post induction systemic chemotherapy with carboplatin and Alimta and she is currently on maintenance treatment with single agent Alimta status post 40 cycles. She is tolerating her treatment well with no significant adverse effects. I recommended for the patient to proceed with cycle #41 today he has a scheduled. She will come back for follow-up visit in 3 weeks for evaluation after repeating CT scan of the chest, abdomen and pelvis for restaging of her disease. For anxiety, I gave her refill of Xanax. The patient was advised to call immediately if she has any concerning symptoms in the interval. The patient voices understanding of current disease status and treatment options and is in agreement with the current care plan.  All questions were answered. The patient knows to call the clinic with any problems, questions or concerns. We can certainly see the patient much sooner if necessary. I spent 10 minutes counseling the patient face to face. The total time spent in the appointment was 15  minutes.   Disclaimer: This note was dictated with voice recognition software. Similar sounding words can inadvertently be transcribed and may not be corrected upon review.

## 2016-10-03 NOTE — Patient Instructions (Signed)
[facility] Discharge Instructions for Patients Receiving Chemotherapy  Today you received the following chemotherapy agents; Alimta.   To help prevent nausea and vomiting after your treatment, we encourage you to take your nausea medication as directed.    If you develop nausea and vomiting that is not controlled by your nausea medication, call the clinic.   BELOW ARE SYMPTOMS THAT SHOULD BE REPORTED IMMEDIATELY:  *FEVER GREATER THAN 100.5 F  *CHILLS WITH OR WITHOUT FEVER  NAUSEA AND VOMITING THAT IS NOT CONTROLLED WITH YOUR NAUSEA MEDICATION  *UNUSUAL SHORTNESS OF BREATH  *UNUSUAL BRUISING OR BLEEDING  TENDERNESS IN MOUTH AND THROAT WITH OR WITHOUT PRESENCE OF ULCERS  *URINARY PROBLEMS  *BOWEL PROBLEMS  UNUSUAL RASH Items with * indicate a potential emergency and should be followed up as soon as possible.  Feel free to call the clinic you have any questions or concerns. The clinic phone number is [phone].  Please show the CHEMO ALERT CARD at check-in to the Emergency Department and triage nurse.   

## 2016-10-03 NOTE — Telephone Encounter (Signed)
Per 2/5 LOS and staff message I have schedueld appts and notified the scheduler

## 2016-10-04 ENCOUNTER — Ambulatory Visit: Payer: BLUE CROSS/BLUE SHIELD

## 2016-10-04 ENCOUNTER — Ambulatory Visit: Payer: BLUE CROSS/BLUE SHIELD | Admitting: Internal Medicine

## 2016-10-04 ENCOUNTER — Other Ambulatory Visit: Payer: BLUE CROSS/BLUE SHIELD

## 2016-10-07 ENCOUNTER — Encounter: Payer: Self-pay | Admitting: Radiation Oncology

## 2016-10-07 NOTE — Progress Notes (Signed)
Paperwork received from [facility]. Given to doctor [facility] nurse Thayer Headings who advised we are no longer treating her paperwork should go to [facility] office. Will forward to [facility] 2/9

## 2016-10-11 ENCOUNTER — Telehealth: Payer: Self-pay | Admitting: Medical Oncology

## 2016-10-11 NOTE — Telephone Encounter (Signed)
Faxed completed disability forms to [employer].Husband aware

## 2016-10-18 ENCOUNTER — Other Ambulatory Visit: Payer: Self-pay | Admitting: Medical Oncology

## 2016-10-18 DIAGNOSIS — C3490 Malignant neoplasm of unspecified part of unspecified bronchus or lung: Secondary | ICD-10-CM

## 2016-10-18 MED ORDER — FOLIC ACID 1 MG PO TABS: 1.0000 mg | ORAL_TABLET | Freq: Every day | ORAL | 0 refills | Status: DC

## 2016-10-21 ENCOUNTER — Ambulatory Visit (HOSPITAL_COMMUNITY)
Admission: RE | Admit: 2016-10-21 | Discharge: 2016-10-21 | Disposition: A | Discharge status: Home / Self Care | Payer: Medicare Other | Source: Ambulatory Visit | Attending: Internal Medicine | Admitting: Internal Medicine

## 2016-10-21 ENCOUNTER — Other Ambulatory Visit: Payer: Self-pay | Admitting: Internal Medicine

## 2016-10-21 DIAGNOSIS — C3411 Malignant neoplasm of upper lobe, right bronchus or lung: Secondary | ICD-10-CM

## 2016-10-21 DIAGNOSIS — C7949 Secondary malignant neoplasm of other parts of nervous system: Secondary | ICD-10-CM | POA: Insufficient documentation

## 2016-10-21 DIAGNOSIS — C7931 Secondary malignant neoplasm of brain: Secondary | ICD-10-CM

## 2016-10-21 DIAGNOSIS — F411 Generalized anxiety disorder: Secondary | ICD-10-CM

## 2016-10-21 DIAGNOSIS — C3491 Malignant neoplasm of unspecified part of right bronchus or lung: Secondary | ICD-10-CM | POA: Diagnosis not present

## 2016-10-21 MED ORDER — IOPAMIDOL (ISOVUE-300) INJECTION 61%
INTRAVENOUS | Status: AC
  Filled 2016-10-21: qty 100

## 2016-10-21 MED ORDER — IOPAMIDOL (ISOVUE-300) INJECTION 61%
100.0000 mL | Freq: Once | INTRAVENOUS | Status: AC | PRN
  Administered 2016-10-21: 100 mL via INTRAVENOUS

## 2016-10-24 ENCOUNTER — Other Ambulatory Visit (HOSPITAL_BASED_OUTPATIENT_CLINIC_OR_DEPARTMENT_OTHER): Payer: Medicare Other

## 2016-10-24 ENCOUNTER — Encounter: Payer: Self-pay | Admitting: *Deleted

## 2016-10-24 ENCOUNTER — Telehealth: Payer: Self-pay | Admitting: Internal Medicine

## 2016-10-24 ENCOUNTER — Ambulatory Visit (HOSPITAL_BASED_OUTPATIENT_CLINIC_OR_DEPARTMENT_OTHER): Payer: Medicare Other | Admitting: Internal Medicine

## 2016-10-24 ENCOUNTER — Encounter: Payer: Self-pay | Admitting: Internal Medicine

## 2016-10-24 ENCOUNTER — Ambulatory Visit (HOSPITAL_BASED_OUTPATIENT_CLINIC_OR_DEPARTMENT_OTHER): Payer: Medicare Other

## 2016-10-24 VITALS — BP 99/66 | HR 64 | Temp 98.4°F | Resp 18 | Ht 64.0 in | Wt 161.4 lb

## 2016-10-24 DIAGNOSIS — C7931 Secondary malignant neoplasm of brain: Secondary | ICD-10-CM | POA: Diagnosis not present

## 2016-10-24 DIAGNOSIS — C7951 Secondary malignant neoplasm of bone: Secondary | ICD-10-CM

## 2016-10-24 DIAGNOSIS — Z5111 Encounter for antineoplastic chemotherapy: Secondary | ICD-10-CM | POA: Diagnosis present

## 2016-10-24 DIAGNOSIS — C3411 Malignant neoplasm of upper lobe, right bronchus or lung: Secondary | ICD-10-CM | POA: Diagnosis not present

## 2016-10-24 DIAGNOSIS — K219 Gastro-esophageal reflux disease without esophagitis: Secondary | ICD-10-CM | POA: Diagnosis not present

## 2016-10-24 DIAGNOSIS — F419 Anxiety disorder, unspecified: Secondary | ICD-10-CM

## 2016-10-24 LAB — COMPREHENSIVE METABOLIC PANEL
ALT: 14 U/L (ref 0–55)
AST: 10 U/L (ref 5–34)
Albumin: 3.6 g/dL (ref 3.5–5.0)
Alkaline Phosphatase: 91 U/L (ref 40–150)
Anion Gap: 8 mEq/L (ref 3–11)
BUN: 13.6 mg/dL (ref 7.0–26.0)
CO2: 22 mEq/L (ref 22–29)
Calcium: 9.4 mg/dL (ref 8.4–10.4)
Chloride: 108 mEq/L (ref 98–109)
Creatinine: 0.8 mg/dL (ref 0.6–1.1)
EGFR: 80 mL/min/{1.73_m2} — ABNORMAL LOW (ref >90)
Glucose: 118 mg/dl (ref 70–140)
Potassium: 4.1 mEq/L (ref 3.5–5.1)
Sodium: 139 mEq/L (ref 136–145)
Total Bilirubin: 0.28 mg/dL (ref 0.20–1.20)
Total Protein: 7 g/dL (ref 6.4–8.3)

## 2016-10-24 LAB — CBC WITH DIFFERENTIAL/PLATELET
BASO%: 0 % (ref 0.0–2.0)
Basophils Absolute: 0 10*3/uL (ref 0.0–0.1)
EOS%: 0 % (ref 0.0–7.0)
Eosinophils Absolute: 0 10*3/uL (ref 0.0–0.5)
HCT: 37.9 % (ref 34.8–46.6)
HGB: 12 g/dL (ref 11.6–15.9)
LYMPH%: 3.3 % — ABNORMAL LOW (ref 14.0–49.7)
MCH: 31.2 pg (ref 25.1–34.0)
MCHC: 31.7 g/dL (ref 31.5–36.0)
MCV: 98.4 fL (ref 79.5–101.0)
MONO#: 0.6 10*3/uL (ref 0.1–0.9)
MONO%: 6.4 % (ref 0.0–14.0)
NEUT#: 8.2 10*3/uL — ABNORMAL HIGH (ref 1.5–6.5)
NEUT%: 90.3 % — ABNORMAL HIGH (ref 38.4–76.8)
Platelets: 272 10*3/uL (ref 145–400)
RBC: 3.85 10*6/uL (ref 3.70–5.45)
RDW: 15.1 % — ABNORMAL HIGH (ref 11.2–14.5)
WBC: 9.1 10*3/uL (ref 3.9–10.3)
lymph#: 0.3 10*3/uL — ABNORMAL LOW (ref 0.9–3.3)

## 2016-10-24 MED ORDER — SODIUM CHLORIDE 0.9 % IV SOLN
500.0000 mg/m2 | Freq: Once | INTRAVENOUS | Status: AC
  Administered 2016-10-24: 900 mg via INTRAVENOUS
  Filled 2016-10-24: qty 20

## 2016-10-24 MED ORDER — CYANOCOBALAMIN 1000 MCG/ML IJ SOLN
1000.0000 ug | Freq: Once | INTRAMUSCULAR | Status: AC
  Administered 2016-10-24: 1000 ug via INTRAMUSCULAR

## 2016-10-24 MED ORDER — CYANOCOBALAMIN 1000 MCG/ML IJ SOLN
INTRAMUSCULAR | Status: AC
  Filled 2016-10-24: qty 1

## 2016-10-24 MED ORDER — SODIUM CHLORIDE 0.9 % IV SOLN
Freq: Once | INTRAVENOUS | Status: AC
  Administered 2016-10-24: 11:00:00 via INTRAVENOUS

## 2016-10-24 NOTE — Progress Notes (Signed)
Oncology Nurse Navigator Documentation  Oncology Nurse Navigator Flowsheets 10/24/2016  Navigator Location [facility]  Navigator Encounter Type Clinic/[facility]  Treatment Phase Treatment/patient needed help with disability forms.  I completed disability cover form and placed on [employer] desk.  I gave the patient's husband Tiffany's business card to call with further needs.  Barriers/Navigation Needs Financial  Interventions Other  Acuity Level 2  Time Spent with Patient 30

## 2016-10-24 NOTE — Telephone Encounter (Signed)
GAVE PATIENT AVS REPORT AND APPOINTMENTS FOR [facility]ARCH [facility]RU [facility]

## 2016-10-24 NOTE — Patient Instructions (Signed)
[facility] Discharge Instructions for Patients Receiving Chemotherapy  Today you received the following chemotherapy agents Alimta.  To help prevent nausea and vomiting after your treatment, we encourage you to take your nausea medication as prescribed.   If you develop nausea and vomiting that is not controlled by your nausea medication, call the clinic.   BELOW ARE SYMPTOMS THAT SHOULD BE REPORTED IMMEDIATELY:  *FEVER GREATER THAN 100.5 F  *CHILLS WITH OR WITHOUT FEVER  NAUSEA AND VOMITING THAT IS NOT CONTROLLED WITH YOUR NAUSEA MEDICATION  *UNUSUAL SHORTNESS OF BREATH  *UNUSUAL BRUISING OR BLEEDING  TENDERNESS IN MOUTH AND THROAT WITH OR WITHOUT PRESENCE OF ULCERS  *URINARY PROBLEMS  *BOWEL PROBLEMS  UNUSUAL RASH Items with * indicate a potential emergency and should be followed up as soon as possible.  Feel free to call the clinic you have any questions or concerns. The clinic phone number is [phone].  Please show the CHEMO ALERT CARD at check-in to the Emergency Department and triage nurse.   

## 2016-10-24 NOTE — Progress Notes (Signed)
[facility] [phone]   Fax:([phone]) [place], [place] [address] [place] [address]  DIAGNOSIS: Stage IV (T2a, N0, M1b) non-small cell lung cancer, adenocarcinoma with negative EGFR and ALK mutations diagnosed in January 2015 and presented with right upper lobe lung mass in addition to a solitary brain metastasis.  PRIOR THERAPY: 1) Status post stereotactic radiotherapy to a solitary right parietal brain lesion under the care of Dr. [name] on 10/16/2013. 2) Status post palliative radiotherapy to the right lung tumor under the care of Dr. [name] completed on 12/05/2013. 3) Systemic chemotherapy with carboplatin for AUC of 5 and Alimta 500 mg/M2 every 3 weeks. First dose Jan 06 2014. Status post 6 cycles.  CURRENT THERAPY: Systemic chemotherapy with maintenance Alimta 500 MG/M2 every 3 weeks, status post 41 cycles.  INTERVAL HISTORY: Alexis [name] 66 y.o. female came to the clinic today for follow-up visit accompanied by her husband. The patient is feeling fine with no specific complaints. She denied having any chest pain, shortness of breath, cough or hemoptysis. She has no fever or chills. She denied having any nausea, vomiting, diarrhea or constipation. She denied having any weight loss or night sweats. She is tolerating her treatment with maintenance Alimta fairly well. She status post 41 cycles. She had repeat CT scan of the chest, abdomen and pelvis performed recently and she is here for evaluation and discussion of her scan results  MEDICAL HISTORY: Past Medical History:  Diagnosis Date  . Anxiety   . Anxiety 06/20/2016  . Cervical cancer ([place])   . Encounter for antineoplastic chemotherapy 07/20/2015  . 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  MEDICATIONS:  Current Outpatient Prescriptions    Medication Sig Dispense Refill  . acetaminophen (TYLENOL) 500 MG tablet Take 500 mg by mouth every 6 (six) hours as needed for mild pain or headache. Reported on 11/02/2015    . ALPRAZolam (XANAX) 0.25 MG tablet Take 1 tablet (0.25 mg total) by mouth 3 (three) times daily as needed. for anxiety 45 tablet 0  . Ascorbic Acid (VITAMIN C GUMMIE PO) Take 1 each by mouth every morning.    Marland Kitchen dexamethasone (DECADRON) 4 MG tablet TAKE 1 TABLET BY MOUTH TWICE A DAY THE DAY BEFORE, DAY OF AND DAY AFTER THE CHEMOTHERAPY EVERY 3 WKS 40 tablet 1  . folic acid (FOLVITE) 1 MG tablet Take 1 tablet (1 mg total) by mouth daily. 90 tablet 0  . Multiple Vitamin (MULTIVITAMIN WITH MINERALS) TABS tablet Take 1 tablet by mouth every morning.    Marland Kitchen omeprazole (PRILOSEC) 20 MG capsule Take 1 capsule (20 mg total) by mouth daily. As needed for reflux or indigestion. 42 capsule PRN  . ondansetron (ZOFRAN) 8 MG tablet Take 1 tablet (8 mg total) by mouth every 8 (eight) hours as needed for nausea or vomiting. 30 tablet 0  . OVER THE COUNTER MEDICATION Take 1 tablet by mouth every morning. (Vitamin A)    . pentoxifylline (TRENTAL) 400 MG CR tablet TAKE ONE TABLET BY MOUTH TWICE DAILY, ALSO  TAKE  VITAMIN  E  400  UNITS  TWICE  DAILY  WITH  PENTOXIFYLLINE 60 tablet 0  . prochlorperazine (COMPAZINE) 10 MG tablet Take 1 tablet (10 mg total) by mouth every 6 (six) hours as needed  for nausea or vomiting. 60 tablet 0  . senna-docusate (SENOKOT-S) 8.6-50 MG tablet Take 1 tablet by mouth daily. 30 tablet prn   No current facility-administered medications for this visit.     SURGICAL HISTORY:  Past Surgical History:  Procedure Laterality Date  . ABDOMINAL HYSTERECTOMY    . COLOSTOMY TAKEDOWN N/A 07/10/2014   Procedure: LAPAROSCOPIC LYSIS OF ADHESIONS (90 MIN) LAPAROSCOPIC ASSISTED COLOSTOMY CLOSURE, RIGID PROCTOSIGMOIDOSCOPY;  Surgeon: Jackolyn Confer, MD;  Location: [facility];  Service: General;  Laterality: N/A;  . LAPAROTOMY N/A  11/03/2013   Procedure: EXPLORATORY LAPAROTOMY, DRAINAGE OF INTRA  ABDOMINAL ABSCESSES, MOBILIZATION OF SPLENIC FLEXURE, SIGMOID COLECTOMY WITH COLOSTOMY;  Surgeon: Odis Hollingshead, MD;  Location: [facility];  Service: General;  Laterality: N/A;  . VIDEO BRONCHOSCOPY Bilateral 08/30/2013   Procedure: VIDEO BRONCHOSCOPY WITH FLUORO;  Surgeon: Tanda Rockers, MD;  Location: [facility];  Service: Cardiopulmonary;  Laterality: Bilateral;    REVIEW OF SYSTEMS:  Constitutional: negative Eyes: negative Ears, nose, mouth, throat, and face: negative Respiratory: negative Cardiovascular: negative Gastrointestinal: negative Genitourinary:negative Integument/breast: negative Hematologic/lymphatic: negative Musculoskeletal:negative Neurological: negative Behavioral/Psych: negative Endocrine: negative Allergic/Immunologic: negative   PHYSICAL EXAMINATION: General appearance: alert, cooperative and no distress Head: Normocephalic, without obvious abnormality, atraumatic Neck: no adenopathy, no JVD, supple, symmetrical, trachea midline and thyroid not enlarged, symmetric, no tenderness/mass/nodules Lymph nodes: Cervical, supraclavicular, and axillary nodes normal. Resp: clear to auscultation bilaterally Back: symmetric, no curvature. ROM normal. No CVA tenderness. Cardio: regular rate and rhythm, S1, S2 normal, no murmur, click, rub or gallop GI: soft, non-tender; bowel sounds normal; no masses,  no organomegaly Extremities: extremities normal, atraumatic, no cyanosis or edema Neurologic: Alert and oriented X 3, normal strength and tone. Normal symmetric reflexes. Normal coordination and gait  ECOG PERFORMANCE STATUS: 0 - Asymptomatic  Blood pressure 99/66, pulse 64, temperature 98.4 F (36.9 C), temperature source Oral, resp. rate 18, height 5' 4"  (1.626 m), weight 161 lb 6.4 oz (73.2 kg), SpO2 99 %.  LABORATORY DATA: Lab Results  Component Value Date   WBC 9.1 10/24/2016   HGB 12.0 10/24/2016     HCT 37.9 10/24/2016   MCV 98.4 10/24/2016   PLT 272 10/24/2016      Chemistry      Component Value Date/Time   NA 139 10/24/2016 0840   K 4.1 10/24/2016 0840   CL 100 07/12/2014 0512   CO2 22 10/24/2016 0840   BUN 13.6 10/24/2016 0840   CREATININE 0.8 10/24/2016 0840      Component Value Date/Time   CALCIUM 9.4 10/24/2016 0840   ALKPHOS 91 10/24/2016 0840   AST 10 10/24/2016 0840   ALT 14 10/24/2016 0840   BILITOT 0.28 10/24/2016 0840       RADIOGRAPHIC STUDIES: Ct Chest W Contrast  Result Date: 10/21/2016 CLINICAL DATA:  Restaging RIGHT lung cancer. Chemotherapy ongoing. Prior brain metastasis with radiotherapy EXAM: CT CHEST, ABDOMEN, AND PELVIS WITH CONTRAST TECHNIQUE: Multidetector CT imaging of the chest, abdomen and pelvis was performed following the standard protocol during bolus administration of intravenous contrast. CONTRAST:  159m ISOVUE-300 IOPAMIDOL (ISOVUE-300) INJECTION 61% COMPARISON:  CT 08/18/2016 FINDINGS: CT CHEST FINDINGS CT CHEST FINDINGS Cardiovascular: No significant vascular findings. Normal heart size. No pericardial effusion. Mediastinum/Nodes: No axillary or supraclavicular adenopathy. No mediastinal hilar adenopathy. No pericardial fluid. Esophagus normal. Lungs/Pleura: Nodular pleuroparenchymal thickening at the RIGHT lung apex again demonstrated. Dominant nodule measures 26 x 18 mm (image 23, series 6) compared with 25 x 16 mm anterior. Anterior to  the dominant nodule is new nodular thickening measuring 18 mm by 11 mm (image 29, series 6). Within the LEFT upper lobe nodular pleuroparenchymal thickening is unchanged (image 14, series 6. Musculoskeletal: No aggressive osseous lesion. CT ABDOMEN AND PELVIS FINDINGS Hepatobiliary: No focal hepatic lesion. No biliary ductal dilatation. Gallbladder is normal. Common bile duct is normal. Pancreas: Immediate Spleen: Normal spleen Adrenals/urinary tract: Adrenal glands and kidneys are normal. The ureters and  bladder normal. Stomach/Bowel: Abdominal aorta is normal caliber with atherosclerotic calcification. There is no retroperitoneal or periportal lymphadenopathy. No pelvic lymphadenopathy. Vascular/Lymphatic: Abdominal aorta is normal caliber. There is no retroperitoneal or periportal lymphadenopathy. No pelvic lymphadenopathy. Reproductive: Post hysterectomy Other: No peritoneal metastasis Musculoskeletal: No aggressive osseous lesion. IMPRESSION: Chest Impression: New nodular thickening at the RIGHT lung apex adjacent to a stable dominant nodule. Differential would include progressive benign post therapy scarring and inflammation versus tumor recurrence. Recommend either continued CT surveillance or further characterization with FDG PET scan Abdomen / Pelvis Impression: No evidence of metastatic disease in the abdomen or pelvis . Electronically Signed   By: Suzy Bouchard M.D.   On: 10/21/2016 14:33   Ct Abdomen Pelvis W Contrast  Result Date: 10/21/2016 CLINICAL DATA:  Restaging RIGHT lung cancer. Chemotherapy ongoing. Prior brain metastasis with radiotherapy EXAM: CT CHEST, ABDOMEN, AND PELVIS WITH CONTRAST TECHNIQUE: Multidetector CT imaging of the chest, abdomen and pelvis was performed following the standard protocol during bolus administration of intravenous contrast. CONTRAST:  140m ISOVUE-300 IOPAMIDOL (ISOVUE-300) INJECTION 61% COMPARISON:  CT 08/18/2016 FINDINGS: CT CHEST FINDINGS CT CHEST FINDINGS Cardiovascular: No significant vascular findings. Normal heart size. No pericardial effusion. Mediastinum/Nodes: No axillary or supraclavicular adenopathy. No mediastinal hilar adenopathy. No pericardial fluid. Esophagus normal. Lungs/Pleura: Nodular pleuroparenchymal thickening at the RIGHT lung apex again demonstrated. Dominant nodule measures 26 x 18 mm (image 23, series 6) compared with 25 x 16 mm anterior. Anterior to the dominant nodule is new nodular thickening measuring 18 mm by 11 mm (image 29,  series 6). Within the LEFT upper lobe nodular pleuroparenchymal thickening is unchanged (image 14, series 6. Musculoskeletal: No aggressive osseous lesion. CT ABDOMEN AND PELVIS FINDINGS Hepatobiliary: No focal hepatic lesion. No biliary ductal dilatation. Gallbladder is normal. Common bile duct is normal. Pancreas: Immediate Spleen: Normal spleen Adrenals/urinary tract: Adrenal glands and kidneys are normal. The ureters and bladder normal. Stomach/Bowel: Abdominal aorta is normal caliber with atherosclerotic calcification. There is no retroperitoneal or periportal lymphadenopathy. No pelvic lymphadenopathy. Vascular/Lymphatic: Abdominal aorta is normal caliber. There is no retroperitoneal or periportal lymphadenopathy. No pelvic lymphadenopathy. Reproductive: Post hysterectomy Other: No peritoneal metastasis Musculoskeletal: No aggressive osseous lesion. IMPRESSION: Chest Impression: New nodular thickening at the RIGHT lung apex adjacent to a stable dominant nodule. Differential would include progressive benign post therapy scarring and inflammation versus tumor recurrence. Recommend either continued CT surveillance or further characterization with FDG PET scan Abdomen / Pelvis Impression: No evidence of metastatic disease in the abdomen or pelvis . Electronically Signed   By: SSuzy BouchardM.D.   On: 10/21/2016 14:33    ASSESSMENT AND PLAN:  This is a very pleasant 66years old white female with metastatic non-small cell lung cancer, adenocarcinoma. She status post induction systemic chemotherapy with carboplatin and Alimta for 6 cycles with partial response.. She is currently on maintenance treatment with single agent Alimta status post 41 cycles. The patient had repeat CT scan of the chest, abdomen and pelvis performed recently. I personally and independently reviewed the scan images and discuss the  results with the patient and her husband. Her scan showed no clear evidence for disease progression  but there was new nodular thickening at the right lung apex questionable to be benign as scarring or inflammatory but tumor recurrence could not be excluded. I recommended for the patient to continue her current treatment with maintenance Alimta for now. We will continue to monitor this nodular thickening closely on the upcoming scan. She will come back for follow-up visit in 3 weeks for evaluation before starting cycle #43. For anxiety, she will continue her treatment with xanax for now. For GERD, she will continue on Prilosec. The patient was advised to call immediately if she has any concerning symptoms in the interval. The patient voices understanding of current disease status and treatment options and is in agreement with the current care plan.  All questions were answered. The patient knows to call the clinic with any problems, questions or concerns. We can certainly see the patient much sooner if necessary.  Disclaimer: This note was dictated with voice recognition software. Similar sounding words can inadvertently be transcribed and may not be corrected upon review.

## 2016-10-24 NOTE — Telephone Encounter (Signed)
Received disability paperwork to be completed  °

## 2016-10-25 ENCOUNTER — Telehealth: Payer: Self-pay | Admitting: Internal Medicine

## 2016-10-25 NOTE — Telephone Encounter (Signed)
Faxed disability paperwork to [employer] at [phone] and made copy and put at front desk registration for patient to pickup

## 2016-11-14 ENCOUNTER — Ambulatory Visit (HOSPITAL_BASED_OUTPATIENT_CLINIC_OR_DEPARTMENT_OTHER): Payer: Medicare Other

## 2016-11-14 ENCOUNTER — Ambulatory Visit (HOSPITAL_BASED_OUTPATIENT_CLINIC_OR_DEPARTMENT_OTHER): Payer: Medicare Other | Admitting: Internal Medicine

## 2016-11-14 ENCOUNTER — Encounter: Payer: Self-pay | Admitting: Internal Medicine

## 2016-11-14 ENCOUNTER — Other Ambulatory Visit (HOSPITAL_BASED_OUTPATIENT_CLINIC_OR_DEPARTMENT_OTHER): Payer: Medicare Other

## 2016-11-14 DIAGNOSIS — C3411 Malignant neoplasm of upper lobe, right bronchus or lung: Secondary | ICD-10-CM

## 2016-11-14 DIAGNOSIS — C7949 Secondary malignant neoplasm of other parts of nervous system: Secondary | ICD-10-CM

## 2016-11-14 DIAGNOSIS — Z5111 Encounter for antineoplastic chemotherapy: Secondary | ICD-10-CM | POA: Diagnosis not present

## 2016-11-14 DIAGNOSIS — F411 Generalized anxiety disorder: Secondary | ICD-10-CM

## 2016-11-14 DIAGNOSIS — C7931 Secondary malignant neoplasm of brain: Secondary | ICD-10-CM

## 2016-11-14 DIAGNOSIS — F419 Anxiety disorder, unspecified: Secondary | ICD-10-CM

## 2016-11-14 LAB — COMPREHENSIVE METABOLIC PANEL
ALT: 18 U/L (ref 0–55)
AST: 13 U/L (ref 5–34)
Albumin: 3.6 g/dL (ref 3.5–5.0)
Alkaline Phosphatase: 85 U/L (ref 40–150)
Anion Gap: 9 mEq/L (ref 3–11)
BUN: 19.1 mg/dL (ref 7.0–26.0)
CO2: 26 mEq/L (ref 22–29)
Calcium: 9.3 mg/dL (ref 8.4–10.4)
Chloride: 106 mEq/L (ref 98–109)
Creatinine: 0.8 mg/dL (ref 0.6–1.1)
EGFR: 77 mL/min/{1.73_m2} — ABNORMAL LOW (ref >90)
Glucose: 120 mg/dl (ref 70–140)
Potassium: 3.9 mEq/L (ref 3.5–5.1)
Sodium: 141 mEq/L (ref 136–145)
Total Bilirubin: 0.31 mg/dL (ref 0.20–1.20)
Total Protein: 6.7 g/dL (ref 6.4–8.3)

## 2016-11-14 LAB — CBC WITH DIFFERENTIAL/PLATELET
BASO%: 0.1 % (ref 0.0–2.0)
Basophils Absolute: 0 10*3/uL (ref 0.0–0.1)
EOS%: 0 % (ref 0.0–7.0)
Eosinophils Absolute: 0 10*3/uL (ref 0.0–0.5)
HCT: 36 % (ref 34.8–46.6)
HGB: 11.9 g/dL (ref 11.6–15.9)
LYMPH%: 4.5 % — ABNORMAL LOW (ref 14.0–49.7)
MCH: 32.2 pg (ref 25.1–34.0)
MCHC: 33 g/dL (ref 31.5–36.0)
MCV: 97.7 fL (ref 79.5–101.0)
MONO#: 0.4 10*3/uL (ref 0.1–0.9)
MONO%: 4.1 % (ref 0.0–14.0)
NEUT#: 7.9 10*3/uL — ABNORMAL HIGH (ref 1.5–6.5)
NEUT%: 91.3 % — ABNORMAL HIGH (ref 38.4–76.8)
Platelets: 241 10*3/uL (ref 145–400)
RBC: 3.68 10*6/uL — ABNORMAL LOW (ref 3.70–5.45)
RDW: 16 % — ABNORMAL HIGH (ref 11.2–14.5)
WBC: 8.6 10*3/uL (ref 3.9–10.3)
lymph#: 0.4 10*3/uL — ABNORMAL LOW (ref 0.9–3.3)

## 2016-11-14 MED ORDER — ONDANSETRON HCL 8 MG PO TABS
ORAL_TABLET | ORAL | Status: AC
  Filled 2016-11-14: qty 1

## 2016-11-14 MED ORDER — ONDANSETRON HCL 8 MG PO TABS
8.0000 mg | ORAL_TABLET | Freq: Once | ORAL | Status: AC
  Administered 2016-11-14: 8 mg via ORAL

## 2016-11-14 MED ORDER — ALPRAZOLAM 0.25 MG PO TABS: 0.2500 mg | ORAL_TABLET | Freq: Three times a day (TID) | ORAL | 0 refills | Status: DC | PRN

## 2016-11-14 MED ORDER — SODIUM CHLORIDE 0.9 % IV SOLN
500.0000 mg/m2 | Freq: Once | INTRAVENOUS | Status: AC
  Administered 2016-11-14: 900 mg via INTRAVENOUS
  Filled 2016-11-14: qty 20

## 2016-11-14 MED ORDER — DEXAMETHASONE SODIUM PHOSPHATE 10 MG/ML IJ SOLN
10.0000 mg | Freq: Once | INTRAMUSCULAR | Status: AC
  Administered 2016-11-14: 10 mg via INTRAVENOUS

## 2016-11-14 MED ORDER — SODIUM CHLORIDE 0.9 % IV SOLN
Freq: Once | INTRAVENOUS | Status: AC
  Administered 2016-11-14: 10:00:00 via INTRAVENOUS

## 2016-11-14 MED ORDER — DEXAMETHASONE SODIUM PHOSPHATE 10 MG/ML IJ SOLN
INTRAMUSCULAR | Status: AC
  Filled 2016-11-14: qty 1

## 2016-11-14 NOTE — Progress Notes (Signed)
[place] [phone]   Fax:([phone]) [place], MD [address] [place] [address]  DIAGNOSIS: Stage IV (T2a, N0, M1b) non-small cell lung cancer, adenocarcinoma with negative EGFR and ALK mutations diagnosed in January 2015 and presented with right upper lobe lung mass in addition to a solitary brain metastasis.  PRIOR THERAPY: 1) Status post stereotactic radiotherapy to a solitary right parietal brain lesion under the care of Dr. [name] on 10/16/2013. 2) Status post palliative radiotherapy to the right lung tumor under the care of Dr. [name] completed on 12/05/2013. 3) Systemic chemotherapy with carboplatin for AUC of 5 and Alimta 500 mg/M2 every 3 weeks. First dose Jan 06 2014. Status post 6 cycles.  CURRENT THERAPY: Systemic chemotherapy with maintenance Alimta 500 MG/M2 every 3 weeks, status post 42 cycles.  INTERVAL HISTORY: Alexis [name] 66 y.o. female returns to the clinic today for follow-up visit accompanied by her husband. The patient is feeling fine today with no specific complaints. She is tolerating her treatment well. She had having any chest pain, shortness of breath, cough or hemoptysis. She has no nausea, vomiting, diarrhea or constipation. The patient has no fever or chills. She denied having any weight loss or night sweats. She is some blurry vision and she would arrange an appointment with her ophthalmologist for evaluation and to rule out cataract. She is here today for evaluation before starting cycle #43.  MEDICAL HISTORY: Past Medical History:  Diagnosis Date  . Anxiety   . Anxiety 06/20/2016  . Cervical cancer ([place])   . Encounter for antineoplastic chemotherapy 07/20/2015  . 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  MEDICATIONS:  Current Outpatient Prescriptions  Medication Sig  Dispense Refill  . acetaminophen (TYLENOL) 500 MG tablet Take 500 mg by mouth every 6 (six) hours as needed for mild pain or headache. Reported on 11/02/2015    . ALPRAZolam (XANAX) 0.25 MG tablet Take 1 tablet (0.25 mg total) by mouth 3 (three) times daily as needed. for anxiety 45 tablet 0  . Ascorbic Acid (VITAMIN C GUMMIE PO) Take 1 each by mouth every morning.    Marland Kitchen dexamethasone (DECADRON) 4 MG tablet TAKE 1 TABLET BY MOUTH TWICE A DAY THE DAY BEFORE, DAY OF AND DAY AFTER THE CHEMOTHERAPY EVERY 3 WKS 40 tablet 1  . folic acid (FOLVITE) 1 MG tablet Take 1 tablet (1 mg total) by mouth daily. 90 tablet 0  . Multiple Vitamin (MULTIVITAMIN WITH MINERALS) TABS tablet Take 1 tablet by mouth every morning.    Marland Kitchen omeprazole (PRILOSEC) 20 MG capsule Take 1 capsule (20 mg total) by mouth daily. As needed for reflux or indigestion. 42 capsule PRN  . OVER THE COUNTER MEDICATION Take 1 tablet by mouth every morning. (Vitamin A)    . pentoxifylline (TRENTAL) 400 MG CR tablet TAKE ONE TABLET BY MOUTH TWICE DAILY, ALSO  TAKE  VITAMIN  E  400  UNITS  TWICE  DAILY  WITH  PENTOXIFYLLINE 60 tablet 0  . senna-docusate (SENOKOT-S) 8.6-50 MG tablet Take 1 tablet by mouth daily. 30 tablet prn  . ondansetron (ZOFRAN) 8 MG tablet Take 1 tablet (8 mg total) by mouth every 8 (eight) hours as needed for nausea or vomiting. (Patient not taking: Reported on 11/14/2016) 30 tablet 0  .  prochlorperazine (COMPAZINE) 10 MG tablet Take 1 tablet (10 mg total) by mouth every 6 (six) hours as needed for nausea or vomiting. (Patient not taking: Reported on 11/14/2016) 60 tablet 0   No current facility-administered medications for this visit.     SURGICAL HISTORY:  Past Surgical History:  Procedure Laterality Date  . ABDOMINAL HYSTERECTOMY    . COLOSTOMY TAKEDOWN N/A 07/10/2014   Procedure: LAPAROSCOPIC LYSIS OF ADHESIONS (90 MIN) LAPAROSCOPIC ASSISTED COLOSTOMY CLOSURE, RIGID PROCTOSIGMOIDOSCOPY;  Surgeon: Jackolyn Confer, MD;   Location: [facility];  Service: General;  Laterality: N/A;  . LAPAROTOMY N/A 11/03/2013   Procedure: EXPLORATORY LAPAROTOMY, DRAINAGE OF INTRA  ABDOMINAL ABSCESSES, MOBILIZATION OF SPLENIC FLEXURE, SIGMOID COLECTOMY WITH COLOSTOMY;  Surgeon: Odis Hollingshead, MD;  Location: [facility];  Service: General;  Laterality: N/A;  . VIDEO BRONCHOSCOPY Bilateral 08/30/2013   Procedure: VIDEO BRONCHOSCOPY WITH FLUORO;  Surgeon: Tanda Rockers, MD;  Location: [facility];  Service: Cardiopulmonary;  Laterality: Bilateral;    REVIEW OF SYSTEMS:  A comprehensive review of systems was negative.   PHYSICAL EXAMINATION: General appearance: alert, cooperative and no distress Head: Normocephalic, without obvious abnormality, atraumatic Neck: no adenopathy, no JVD, supple, symmetrical, trachea midline and thyroid not enlarged, symmetric, no tenderness/mass/nodules Lymph nodes: Cervical, supraclavicular, and axillary nodes normal. Resp: clear to auscultation bilaterally Back: symmetric, no curvature. ROM normal. No CVA tenderness. Cardio: regular rate and rhythm, S1, S2 normal, no murmur, click, rub or gallop GI: soft, non-tender; bowel sounds normal; no masses,  no organomegaly Extremities: extremities normal, atraumatic, no cyanosis or edema  ECOG PERFORMANCE STATUS: 0 - Asymptomatic  Blood pressure (!) 108/56, pulse (!) 59, temperature 98 F (36.7 C), temperature source Oral, resp. rate 18, height '5\' 4"'$  (1.626 m), weight 162 lb 11.2 oz (73.8 kg), SpO2 100 %.  LABORATORY DATA: Lab Results  Component Value Date   WBC 8.6 11/14/2016   HGB 11.9 11/14/2016   HCT 36.0 11/14/2016   MCV 97.7 11/14/2016   PLT 241 11/14/2016      Chemistry      Component Value Date/Time   NA 139 10/24/2016 0840   K 4.1 10/24/2016 0840   CL 100 07/12/2014 0512   CO2 22 10/24/2016 0840   BUN 13.6 10/24/2016 0840   CREATININE 0.8 10/24/2016 0840      Component Value Date/Time   CALCIUM 9.4 10/24/2016 0840   ALKPHOS 91  10/24/2016 0840   AST 10 10/24/2016 0840   ALT 14 10/24/2016 0840   BILITOT 0.28 10/24/2016 0840       RADIOGRAPHIC STUDIES: Ct Chest W Contrast  Result Date: 10/21/2016 CLINICAL DATA:  Restaging RIGHT lung cancer. Chemotherapy ongoing. Prior brain metastasis with radiotherapy EXAM: CT CHEST, ABDOMEN, AND PELVIS WITH CONTRAST TECHNIQUE: Multidetector CT imaging of the chest, abdomen and pelvis was performed following the standard protocol during bolus administration of intravenous contrast. CONTRAST:  167m ISOVUE-300 IOPAMIDOL (ISOVUE-300) INJECTION 61% COMPARISON:  CT 08/18/2016 FINDINGS: CT CHEST FINDINGS CT CHEST FINDINGS Cardiovascular: No significant vascular findings. Normal heart size. No pericardial effusion. Mediastinum/Nodes: No axillary or supraclavicular adenopathy. No mediastinal hilar adenopathy. No pericardial fluid. Esophagus normal. Lungs/Pleura: Nodular pleuroparenchymal thickening at the RIGHT lung apex again demonstrated. Dominant nodule measures 26 x 18 mm (image 23, series 6) compared with 25 x 16 mm anterior. Anterior to the dominant nodule is new nodular thickening measuring 18 mm by 11 mm (image 29, series 6). Within the LEFT upper lobe nodular pleuroparenchymal thickening is unchanged (image 14, series 6.  Musculoskeletal: No aggressive osseous lesion. CT ABDOMEN AND PELVIS FINDINGS Hepatobiliary: No focal hepatic lesion. No biliary ductal dilatation. Gallbladder is normal. Common bile duct is normal. Pancreas: Immediate Spleen: Normal spleen Adrenals/urinary tract: Adrenal glands and kidneys are normal. The ureters and bladder normal. Stomach/Bowel: Abdominal aorta is normal caliber with atherosclerotic calcification. There is no retroperitoneal or periportal lymphadenopathy. No pelvic lymphadenopathy. Vascular/Lymphatic: Abdominal aorta is normal caliber. There is no retroperitoneal or periportal lymphadenopathy. No pelvic lymphadenopathy. Reproductive: Post hysterectomy Other:  No peritoneal metastasis Musculoskeletal: No aggressive osseous lesion. IMPRESSION: Chest Impression: New nodular thickening at the RIGHT lung apex adjacent to a stable dominant nodule. Differential would include progressive benign post therapy scarring and inflammation versus tumor recurrence. Recommend either continued CT surveillance or further characterization with FDG PET scan Abdomen / Pelvis Impression: No evidence of metastatic disease in the abdomen or pelvis . Electronically Signed   By: Suzy Bouchard M.D.   On: 10/21/2016 14:33   Ct Abdomen Pelvis W Contrast  Result Date: 10/21/2016 CLINICAL DATA:  Restaging RIGHT lung cancer. Chemotherapy ongoing. Prior brain metastasis with radiotherapy EXAM: CT CHEST, ABDOMEN, AND PELVIS WITH CONTRAST TECHNIQUE: Multidetector CT imaging of the chest, abdomen and pelvis was performed following the standard protocol during bolus administration of intravenous contrast. CONTRAST:  151m ISOVUE-300 IOPAMIDOL (ISOVUE-300) INJECTION 61% COMPARISON:  CT 08/18/2016 FINDINGS: CT CHEST FINDINGS CT CHEST FINDINGS Cardiovascular: No significant vascular findings. Normal heart size. No pericardial effusion. Mediastinum/Nodes: No axillary or supraclavicular adenopathy. No mediastinal hilar adenopathy. No pericardial fluid. Esophagus normal. Lungs/Pleura: Nodular pleuroparenchymal thickening at the RIGHT lung apex again demonstrated. Dominant nodule measures 26 x 18 mm (image 23, series 6) compared with 25 x 16 mm anterior. Anterior to the dominant nodule is new nodular thickening measuring 18 mm by 11 mm (image 29, series 6). Within the LEFT upper lobe nodular pleuroparenchymal thickening is unchanged (image 14, series 6. Musculoskeletal: No aggressive osseous lesion. CT ABDOMEN AND PELVIS FINDINGS Hepatobiliary: No focal hepatic lesion. No biliary ductal dilatation. Gallbladder is normal. Common bile duct is normal. Pancreas: Immediate Spleen: Normal spleen Adrenals/urinary  tract: Adrenal glands and kidneys are normal. The ureters and bladder normal. Stomach/Bowel: Abdominal aorta is normal caliber with atherosclerotic calcification. There is no retroperitoneal or periportal lymphadenopathy. No pelvic lymphadenopathy. Vascular/Lymphatic: Abdominal aorta is normal caliber. There is no retroperitoneal or periportal lymphadenopathy. No pelvic lymphadenopathy. Reproductive: Post hysterectomy Other: No peritoneal metastasis Musculoskeletal: No aggressive osseous lesion. IMPRESSION: Chest Impression: New nodular thickening at the RIGHT lung apex adjacent to a stable dominant nodule. Differential would include progressive benign post therapy scarring and inflammation versus tumor recurrence. Recommend either continued CT surveillance or further characterization with FDG PET scan Abdomen / Pelvis Impression: No evidence of metastatic disease in the abdomen or pelvis . Electronically Signed   By: SSuzy BouchardM.D.   On: 10/21/2016 14:33    ASSESSMENT AND PLAN:  This is a very pleasant 66years old white female with metastatic non-small cell lung cancer, adenocarcinoma status post induction systemic chemotherapy with carboplatin and Alimta with partial response and she is currently on maintenance treatment with Alimta status post 42 cycles. The patient is doing fine and tolerating her treatment well. I recommended for her to continue her treatment with Alimta and she will start cycle #43 today. She will come back for follow-up visit in 3 weeks for evaluation before starting cycle #44. For anxiety, I gave the patient refill for Xanax. She was advised to call immediately if she has  any concerning symptoms in the interval. The patient voices understanding of current disease status and treatment options and is in agreement with the current care plan. All questions were answered. The patient knows to call the clinic with any problems, questions or concerns. We can certainly see the  patient much sooner if necessary. I spent 10 minutes counseling the patient face to face. The total time spent in the appointment was 15 minutes.  Disclaimer: This note was dictated with voice recognition software. Similar sounding words can inadvertently be transcribed and may not be corrected upon review.

## 2016-11-14 NOTE — Patient Instructions (Signed)
[facility] Discharge Instructions for Patients Receiving Chemotherapy  Today you received the following chemotherapy agents Alimta.  To help prevent nausea and vomiting after your treatment, we encourage you to take your nausea medication as prescribed.   If you develop nausea and vomiting that is not controlled by your nausea medication, call the clinic.   BELOW ARE SYMPTOMS THAT SHOULD BE REPORTED IMMEDIATELY:  *FEVER GREATER THAN 100.5 F  *CHILLS WITH OR WITHOUT FEVER  NAUSEA AND VOMITING THAT IS NOT CONTROLLED WITH YOUR NAUSEA MEDICATION  *UNUSUAL SHORTNESS OF BREATH  *UNUSUAL BRUISING OR BLEEDING  TENDERNESS IN MOUTH AND THROAT WITH OR WITHOUT PRESENCE OF ULCERS  *URINARY PROBLEMS  *BOWEL PROBLEMS  UNUSUAL RASH Items with * indicate a potential emergency and should be followed up as soon as possible.  Feel free to call the clinic you have any questions or concerns. The clinic phone number is [phone].  Please show the CHEMO ALERT CARD at check-in to the Emergency Department and triage nurse.   

## 2016-12-02 ENCOUNTER — Other Ambulatory Visit: Payer: Self-pay | Admitting: Radiation Therapy

## 2016-12-02 DIAGNOSIS — C7949 Secondary malignant neoplasm of other parts of nervous system: Principal | ICD-10-CM

## 2016-12-02 DIAGNOSIS — C7931 Secondary malignant neoplasm of brain: Secondary | ICD-10-CM

## 2016-12-05 ENCOUNTER — Ambulatory Visit (HOSPITAL_BASED_OUTPATIENT_CLINIC_OR_DEPARTMENT_OTHER): Payer: Medicare Other | Admitting: Internal Medicine

## 2016-12-05 ENCOUNTER — Encounter: Payer: Self-pay | Admitting: Internal Medicine

## 2016-12-05 ENCOUNTER — Other Ambulatory Visit (HOSPITAL_BASED_OUTPATIENT_CLINIC_OR_DEPARTMENT_OTHER): Payer: Medicare Other

## 2016-12-05 ENCOUNTER — Ambulatory Visit (HOSPITAL_BASED_OUTPATIENT_CLINIC_OR_DEPARTMENT_OTHER): Payer: Medicare Other

## 2016-12-05 ENCOUNTER — Telehealth: Payer: Self-pay | Admitting: Internal Medicine

## 2016-12-05 VITALS — BP 115/68 | HR 72 | Temp 97.7°F | Resp 18 | Ht 64.0 in | Wt 162.3 lb

## 2016-12-05 DIAGNOSIS — C3411 Malignant neoplasm of upper lobe, right bronchus or lung: Secondary | ICD-10-CM

## 2016-12-05 DIAGNOSIS — C7931 Secondary malignant neoplasm of brain: Secondary | ICD-10-CM

## 2016-12-05 DIAGNOSIS — Z5111 Encounter for antineoplastic chemotherapy: Secondary | ICD-10-CM

## 2016-12-05 DIAGNOSIS — C7949 Secondary malignant neoplasm of other parts of nervous system: Secondary | ICD-10-CM

## 2016-12-05 LAB — CBC WITH DIFFERENTIAL/PLATELET
BASO%: 0 % (ref 0.0–2.0)
Basophils Absolute: 0 10*3/uL (ref 0.0–0.1)
EOS%: 0 % (ref 0.0–7.0)
Eosinophils Absolute: 0 10*3/uL (ref 0.0–0.5)
HCT: 38.4 % (ref 34.8–46.6)
HGB: 12.3 g/dL (ref 11.6–15.9)
LYMPH%: 3 % — ABNORMAL LOW (ref 14.0–49.7)
MCH: 31.9 pg (ref 25.1–34.0)
MCHC: 32 g/dL (ref 31.5–36.0)
MCV: 99.5 fL (ref 79.5–101.0)
MONO#: 0.5 10*3/uL (ref 0.1–0.9)
MONO%: 4.4 % (ref 0.0–14.0)
NEUT#: 9.5 10*3/uL — ABNORMAL HIGH (ref 1.5–6.5)
NEUT%: 92.6 % — ABNORMAL HIGH (ref 38.4–76.8)
Platelets: 261 10*3/uL (ref 145–400)
RBC: 3.86 10*6/uL (ref 3.70–5.45)
RDW: 16.1 % — ABNORMAL HIGH (ref 11.2–14.5)
WBC: 10.3 10*3/uL (ref 3.9–10.3)
lymph#: 0.3 10*3/uL — ABNORMAL LOW (ref 0.9–3.3)

## 2016-12-05 LAB — COMPREHENSIVE METABOLIC PANEL
ALT: 32 U/L (ref 0–55)
AST: 17 U/L (ref 5–34)
Albumin: 3.8 g/dL (ref 3.5–5.0)
Alkaline Phosphatase: 89 U/L (ref 40–150)
Anion Gap: 9 mEq/L (ref 3–11)
BUN: 18.7 mg/dL (ref 7.0–26.0)
CO2: 27 mEq/L (ref 22–29)
Calcium: 9.8 mg/dL (ref 8.4–10.4)
Chloride: 105 mEq/L (ref 98–109)
Creatinine: 0.8 mg/dL (ref 0.6–1.1)
EGFR: 81 mL/min/{1.73_m2} — ABNORMAL LOW (ref >90)
Glucose: 117 mg/dl (ref 70–140)
Potassium: 4.2 mEq/L (ref 3.5–5.1)
Sodium: 141 mEq/L (ref 136–145)
Total Bilirubin: 0.38 mg/dL (ref 0.20–1.20)
Total Protein: 7.2 g/dL (ref 6.4–8.3)

## 2016-12-05 MED ORDER — DEXAMETHASONE SODIUM PHOSPHATE 10 MG/ML IJ SOLN
INTRAMUSCULAR | Status: AC
  Filled 2016-12-05: qty 1

## 2016-12-05 MED ORDER — DEXAMETHASONE SODIUM PHOSPHATE 10 MG/ML IJ SOLN
10.0000 mg | Freq: Once | INTRAMUSCULAR | Status: AC
  Administered 2016-12-05: 10 mg via INTRAVENOUS

## 2016-12-05 MED ORDER — SODIUM CHLORIDE 0.9 % IV SOLN
Freq: Once | INTRAVENOUS | Status: AC
  Administered 2016-12-05: 11:00:00 via INTRAVENOUS

## 2016-12-05 MED ORDER — ONDANSETRON HCL 8 MG PO TABS
8.0000 mg | ORAL_TABLET | Freq: Once | ORAL | Status: AC
  Administered 2016-12-05: 8 mg via ORAL

## 2016-12-05 MED ORDER — SODIUM CHLORIDE 0.9 % IV SOLN
500.0000 mg/m2 | Freq: Once | INTRAVENOUS | Status: AC
  Administered 2016-12-05: 900 mg via INTRAVENOUS
  Filled 2016-12-05: qty 20

## 2016-12-05 MED ORDER — ONDANSETRON HCL 8 MG PO TABS
ORAL_TABLET | ORAL | Status: AC
  Filled 2016-12-05: qty 1

## 2016-12-05 NOTE — Patient Instructions (Signed)
[facility] Discharge Instructions for Patients Receiving Chemotherapy  Today you received the following chemotherapy agents Alimta.  To help prevent nausea and vomiting after your treatment, we encourage you to take your nausea medication as prescribed.   If you develop nausea and vomiting that is not controlled by your nausea medication, call the clinic.   BELOW ARE SYMPTOMS THAT SHOULD BE REPORTED IMMEDIATELY:  *FEVER GREATER THAN 100.5 F  *CHILLS WITH OR WITHOUT FEVER  NAUSEA AND VOMITING THAT IS NOT CONTROLLED WITH YOUR NAUSEA MEDICATION  *UNUSUAL SHORTNESS OF BREATH  *UNUSUAL BRUISING OR BLEEDING  TENDERNESS IN MOUTH AND THROAT WITH OR WITHOUT PRESENCE OF ULCERS  *URINARY PROBLEMS  *BOWEL PROBLEMS  UNUSUAL RASH Items with * indicate a potential emergency and should be followed up as soon as possible.  Feel free to call the clinic you have any questions or concerns. The clinic phone number is [phone].  Please show the CHEMO ALERT CARD at check-in to the Emergency Department and triage nurse.   

## 2016-12-05 NOTE — Telephone Encounter (Signed)
Gave patient AVS and calender per 4/9 los. 

## 2016-12-05 NOTE — Progress Notes (Signed)
[place] Telephone:([phone]   Fax:(336) [place], MD [address] [place] [address]  DIAGNOSIS: Stage IV (T2a, N0, M1b) non-small cell lung cancer, adenocarcinoma with negative EGFR and ALK mutations diagnosed in January 2015 and presented with right upper lobe lung mass in addition to a solitary brain metastasis.  PRIOR THERAPY: 1) Status post stereotactic radiotherapy to a solitary right parietal brain lesion under the care of Dr. [name] on 10/16/2013. 2) Status post palliative radiotherapy to the right lung tumor under the care of Dr. [name] completed on 12/05/2013. 3) Systemic chemotherapy with carboplatin for AUC of 5 and Alimta 500 mg/M2 every 3 weeks. First dose Jan 06 2014. Status post 6 cycles.  CURRENT THERAPY: Systemic chemotherapy with maintenance Alimta 500 MG/M2 every 3 weeks, status post 43 cycles.  INTERVAL HISTORY: Alexis [name] 66 y.o. female returns to the clinic today for follow-up visit accompanied by her husband. The patient is feeling fine with no specific complaints. She is tolerating her current maintenance treatment fairly well with no significant adverse effects. She has no chest pain, shortness of breath, cough or hemoptysis. She has no nausea, vomiting, diarrhea or constipation. She is here today for evaluation before starting cycle #44.  MEDICAL HISTORY: Past Medical History:  Diagnosis Date  . Anxiety   . Anxiety 06/20/2016  . Cervical cancer ([place])   . Encounter for antineoplastic chemotherapy 07/20/2015  . 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  MEDICATIONS:  Current Outpatient Prescriptions  Medication Sig Dispense Refill  . acetaminophen (TYLENOL) 500 MG tablet Take 500 mg by mouth every 6 (six) hours as needed for mild pain or headache. Reported on 11/02/2015    .  ALPRAZolam (XANAX) 0.25 MG tablet Take 1 tablet (0.25 mg total) by mouth 3 (three) times daily as needed. for anxiety 45 tablet 0  . Ascorbic Acid (VITAMIN C GUMMIE PO) Take 1 each by mouth every morning.    Marland Kitchen dexamethasone (DECADRON) 4 MG tablet TAKE 1 TABLET BY MOUTH TWICE A DAY THE DAY BEFORE, DAY OF AND DAY AFTER THE CHEMOTHERAPY EVERY 3 WKS 40 tablet 1  . folic acid (FOLVITE) 1 MG tablet Take 1 tablet (1 mg total) by mouth daily. 90 tablet 0  . Multiple Vitamin (MULTIVITAMIN WITH MINERALS) TABS tablet Take 1 tablet by mouth every morning.    Marland Kitchen omeprazole (PRILOSEC) 20 MG capsule Take 1 capsule (20 mg total) by mouth daily. As needed for reflux or indigestion. 42 capsule PRN  . ondansetron (ZOFRAN) 8 MG tablet Take 1 tablet (8 mg total) by mouth every 8 (eight) hours as needed for nausea or vomiting. 30 tablet 0  . OVER THE COUNTER MEDICATION Take 1 tablet by mouth every morning. (Vitamin A)    . pentoxifylline (TRENTAL) 400 MG CR tablet TAKE ONE TABLET BY MOUTH TWICE DAILY, ALSO  TAKE  VITAMIN  E  400  UNITS  TWICE  DAILY  WITH  PENTOXIFYLLINE 60 tablet 0  . prochlorperazine (COMPAZINE) 10 MG tablet Take 1 tablet (10 mg total) by mouth every 6 (six) hours as needed for nausea or vomiting. 60 tablet 0  . senna-docusate (SENOKOT-S) 8.6-50 MG tablet Take 1 tablet by mouth daily. 30 tablet prn   No current facility-administered medications for this visit.  SURGICAL HISTORY:  Past Surgical History:  Procedure Laterality Date  . ABDOMINAL HYSTERECTOMY    . COLOSTOMY TAKEDOWN N/A 07/10/2014   Procedure: LAPAROSCOPIC LYSIS OF ADHESIONS (90 MIN) LAPAROSCOPIC ASSISTED COLOSTOMY CLOSURE, RIGID PROCTOSIGMOIDOSCOPY;  Surgeon: Jackolyn Confer, MD;  Location: [facility];  Service: General;  Laterality: N/A;  . LAPAROTOMY N/A 11/03/2013   Procedure: EXPLORATORY LAPAROTOMY, DRAINAGE OF INTRA  ABDOMINAL ABSCESSES, MOBILIZATION OF SPLENIC FLEXURE, SIGMOID COLECTOMY WITH COLOSTOMY;  Surgeon: Odis Hollingshead,  MD;  Location: [facility];  Service: General;  Laterality: N/A;  . VIDEO BRONCHOSCOPY Bilateral 08/30/2013   Procedure: VIDEO BRONCHOSCOPY WITH FLUORO;  Surgeon: Tanda Rockers, MD;  Location: [facility];  Service: Cardiopulmonary;  Laterality: Bilateral;    REVIEW OF SYSTEMS:  A comprehensive review of systems was negative.   PHYSICAL EXAMINATION: General appearance: alert, cooperative and no distress Head: Normocephalic, without obvious abnormality, atraumatic Neck: no adenopathy, no JVD, supple, symmetrical, trachea midline and thyroid not enlarged, symmetric, no tenderness/mass/nodules Lymph nodes: Cervical, supraclavicular, and axillary nodes normal. Resp: clear to auscultation bilaterally Back: symmetric, no curvature. ROM normal. No CVA tenderness. Cardio: regular rate and rhythm, S1, S2 normal, no murmur, click, rub or gallop GI: soft, non-tender; bowel sounds normal; no masses,  no organomegaly Extremities: extremities normal, atraumatic, no cyanosis or edema  ECOG PERFORMANCE STATUS: 0 - Asymptomatic  Blood pressure 115/68, pulse 72, temperature 97.7 F (36.5 C), temperature source Oral, resp. rate 18, height _0  (1.626 m), weight 162 lb 4.8 oz (73.6 kg), SpO2 100 %.  LABORATORY DATA: Lab Results  Component Value Date   WBC 10.3 12/05/2016   HGB 12.3 12/05/2016   HCT 38.4 12/05/2016   MCV 99.5 12/05/2016   PLT 261 12/05/2016      Chemistry      Component Value Date/Time   NA 141 12/05/2016 0840   K 4.2 12/05/2016 0840   CL 100 07/12/2014 0512   CO2 27 12/05/2016 0840   BUN 18.7 12/05/2016 0840   CREATININE 0.8 12/05/2016 0840      Component Value Date/Time   CALCIUM 9.8 12/05/2016 0840   ALKPHOS 89 12/05/2016 0840   AST 17 12/05/2016 0840   ALT 32 12/05/2016 0840   BILITOT 0.38 12/05/2016 0840       RADIOGRAPHIC STUDIES: No results found.  ASSESSMENT AND PLAN:  This is a very pleasant 66 years old white female with metastatic non-small cell lung cancer,  adenocarcinoma status post induction systemic chemotherapy was carboplatin and Alimta with partial response and she is currently on maintenance treatment with single agent Alimta status post 42 cycles. She is tolerating the treatment well with no significant adverse effects. I recommended for her to proceed with cycle #44 today as scheduled. I will see her back for follow-up visit in 3 weeks for evaluation after repeating CT scan of the chest, abdomen and pelvis for restaging of her disease. She was advised to call immediately if she has any concerning symptoms in the interval. The patient voices understanding of current disease status and treatment options and is in agreement with the current care plan. All questions were answered. The patient knows to call the clinic with any problems, questions or concerns. We can certainly see the patient much sooner if necessary. I spent 10 minutes counseling the patient face to face. The total time spent in the appointment was 15 minutes. Disclaimer: This note was dictated with voice recognition software. Similar sounding words can inadvertently be transcribed and may not be corrected  upon review.

## 2016-12-23 ENCOUNTER — Ambulatory Visit (HOSPITAL_COMMUNITY)
Admission: RE | Admit: 2016-12-23 | Discharge: 2016-12-23 | Disposition: A | Discharge status: Home / Self Care | Payer: Medicare Other | Source: Ambulatory Visit | Attending: Internal Medicine | Admitting: Internal Medicine

## 2016-12-23 DIAGNOSIS — C7931 Secondary malignant neoplasm of brain: Secondary | ICD-10-CM | POA: Insufficient documentation

## 2016-12-23 DIAGNOSIS — C7949 Secondary malignant neoplasm of other parts of nervous system: Secondary | ICD-10-CM | POA: Diagnosis not present

## 2016-12-23 DIAGNOSIS — R918 Other nonspecific abnormal finding of lung field: Secondary | ICD-10-CM | POA: Diagnosis not present

## 2016-12-23 DIAGNOSIS — C3411 Malignant neoplasm of upper lobe, right bronchus or lung: Secondary | ICD-10-CM | POA: Diagnosis not present

## 2016-12-23 DIAGNOSIS — Z5111 Encounter for antineoplastic chemotherapy: Secondary | ICD-10-CM

## 2016-12-23 MED ORDER — IOPAMIDOL (ISOVUE-300) INJECTION 61%
INTRAVENOUS | Status: AC
  Administered 2016-12-23: 100 mL
  Filled 2016-12-23: qty 100

## 2016-12-26 ENCOUNTER — Other Ambulatory Visit (HOSPITAL_BASED_OUTPATIENT_CLINIC_OR_DEPARTMENT_OTHER): Payer: Medicare Other

## 2016-12-26 ENCOUNTER — Encounter: Payer: Self-pay | Admitting: Internal Medicine

## 2016-12-26 ENCOUNTER — Telehealth: Payer: Self-pay | Admitting: Internal Medicine

## 2016-12-26 ENCOUNTER — Ambulatory Visit (HOSPITAL_BASED_OUTPATIENT_CLINIC_OR_DEPARTMENT_OTHER): Payer: Medicare Other

## 2016-12-26 ENCOUNTER — Ambulatory Visit (HOSPITAL_BASED_OUTPATIENT_CLINIC_OR_DEPARTMENT_OTHER): Payer: Medicare Other | Admitting: Internal Medicine

## 2016-12-26 DIAGNOSIS — C7931 Secondary malignant neoplasm of brain: Secondary | ICD-10-CM

## 2016-12-26 DIAGNOSIS — C3411 Malignant neoplasm of upper lobe, right bronchus or lung: Secondary | ICD-10-CM | POA: Diagnosis not present

## 2016-12-26 DIAGNOSIS — F419 Anxiety disorder, unspecified: Secondary | ICD-10-CM | POA: Diagnosis not present

## 2016-12-26 DIAGNOSIS — Z5111 Encounter for antineoplastic chemotherapy: Secondary | ICD-10-CM | POA: Diagnosis not present

## 2016-12-26 DIAGNOSIS — F411 Generalized anxiety disorder: Secondary | ICD-10-CM

## 2016-12-26 DIAGNOSIS — C7949 Secondary malignant neoplasm of other parts of nervous system: Secondary | ICD-10-CM

## 2016-12-26 LAB — CBC WITH DIFFERENTIAL/PLATELET
BASO%: 0 % (ref 0.0–2.0)
Basophils Absolute: 0 10*3/uL (ref 0.0–0.1)
EOS%: 0 % (ref 0.0–7.0)
Eosinophils Absolute: 0 10*3/uL (ref 0.0–0.5)
HCT: 36.5 % (ref 34.8–46.6)
HGB: 11.9 g/dL (ref 11.6–15.9)
LYMPH%: 4.5 % — ABNORMAL LOW (ref 14.0–49.7)
MCH: 32.2 pg (ref 25.1–34.0)
MCHC: 32.6 g/dL (ref 31.5–36.0)
MCV: 98.9 fL (ref 79.5–101.0)
MONO#: 0.5 10*3/uL (ref 0.1–0.9)
MONO%: 5.8 % (ref 0.0–14.0)
NEUT#: 7 10*3/uL — ABNORMAL HIGH (ref 1.5–6.5)
NEUT%: 89.7 % — ABNORMAL HIGH (ref 38.4–76.8)
Platelets: 243 10*3/uL (ref 145–400)
RBC: 3.69 10*6/uL — ABNORMAL LOW (ref 3.70–5.45)
RDW: 15.6 % — ABNORMAL HIGH (ref 11.2–14.5)
WBC: 7.8 10*3/uL (ref 3.9–10.3)
lymph#: 0.4 10*3/uL — ABNORMAL LOW (ref 0.9–3.3)

## 2016-12-26 LAB — COMPREHENSIVE METABOLIC PANEL
ALT: 18 U/L (ref 0–55)
AST: 13 U/L (ref 5–34)
Albumin: 3.7 g/dL (ref 3.5–5.0)
Alkaline Phosphatase: 89 U/L (ref 40–150)
Anion Gap: 12 mEq/L — ABNORMAL HIGH (ref 3–11)
BUN: 14.2 mg/dL (ref 7.0–26.0)
CO2: 23 mEq/L (ref 22–29)
Calcium: 9.7 mg/dL (ref 8.4–10.4)
Chloride: 107 mEq/L (ref 98–109)
Creatinine: 0.8 mg/dL (ref 0.6–1.1)
EGFR: 81 mL/min/{1.73_m2} — ABNORMAL LOW (ref >90)
Glucose: 117 mg/dl (ref 70–140)
Potassium: 4 mEq/L (ref 3.5–5.1)
Sodium: 142 mEq/L (ref 136–145)
Total Bilirubin: 0.41 mg/dL (ref 0.20–1.20)
Total Protein: 6.8 g/dL (ref 6.4–8.3)

## 2016-12-26 MED ORDER — SODIUM CHLORIDE 0.9 % IV SOLN
500.0000 mg/m2 | Freq: Once | INTRAVENOUS | Status: AC
  Administered 2016-12-26: 900 mg via INTRAVENOUS
  Filled 2016-12-26: qty 16

## 2016-12-26 MED ORDER — ONDANSETRON HCL 8 MG PO TABS
8.0000 mg | ORAL_TABLET | Freq: Once | ORAL | Status: AC
  Administered 2016-12-26: 8 mg via ORAL

## 2016-12-26 MED ORDER — SODIUM CHLORIDE 0.9 % IV SOLN
Freq: Once | INTRAVENOUS | Status: AC
  Administered 2016-12-26: 11:00:00 via INTRAVENOUS

## 2016-12-26 MED ORDER — ALPRAZOLAM 0.25 MG PO TABS: 0.2500 mg | ORAL_TABLET | Freq: Three times a day (TID) | ORAL | 0 refills | Status: DC | PRN

## 2016-12-26 MED ORDER — ONDANSETRON HCL 8 MG PO TABS
ORAL_TABLET | ORAL | Status: AC
  Filled 2016-12-26: qty 1

## 2016-12-26 MED ORDER — DEXAMETHASONE SODIUM PHOSPHATE 10 MG/ML IJ SOLN
10.0000 mg | Freq: Once | INTRAMUSCULAR | Status: AC
  Administered 2016-12-26: 10 mg via INTRAVENOUS

## 2016-12-26 MED ORDER — CYANOCOBALAMIN 1000 MCG/ML IJ SOLN
1000.0000 ug | Freq: Once | INTRAMUSCULAR | Status: AC
  Administered 2016-12-26: 1000 ug via INTRAMUSCULAR

## 2016-12-26 MED ORDER — DEXAMETHASONE SODIUM PHOSPHATE 10 MG/ML IJ SOLN
INTRAMUSCULAR | Status: AC
  Filled 2016-12-26: qty 1

## 2016-12-26 MED ORDER — CYANOCOBALAMIN 1000 MCG/ML IJ SOLN
INTRAMUSCULAR | Status: AC
  Filled 2016-12-26: qty 1

## 2016-12-26 NOTE — Patient Instructions (Signed)
[facility] Discharge Instructions for Patients Receiving Chemotherapy  Today you received the following chemotherapy agents Alimta.  To help prevent nausea and vomiting after your treatment, we encourage you to take your nausea medication as prescribed.   If you develop nausea and vomiting that is not controlled by your nausea medication, call the clinic.   BELOW ARE SYMPTOMS THAT SHOULD BE REPORTED IMMEDIATELY:  *FEVER GREATER THAN 100.5 F  *CHILLS WITH OR WITHOUT FEVER  NAUSEA AND VOMITING THAT IS NOT CONTROLLED WITH YOUR NAUSEA MEDICATION  *UNUSUAL SHORTNESS OF BREATH  *UNUSUAL BRUISING OR BLEEDING  TENDERNESS IN MOUTH AND THROAT WITH OR WITHOUT PRESENCE OF ULCERS  *URINARY PROBLEMS  *BOWEL PROBLEMS  UNUSUAL RASH Items with * indicate a potential emergency and should be followed up as soon as possible.  Feel free to call the clinic you have any questions or concerns. The clinic phone number is [phone].  Please show the CHEMO ALERT CARD at check-in to the Emergency Department and triage nurse.   

## 2016-12-26 NOTE — Telephone Encounter (Signed)
Appointments scheduled per 12/26/16 los. Patient was given a copy of the AVS report and appointment scheduled per 12/26/16 los.

## 2016-12-26 NOTE — Progress Notes (Signed)
[place] [phone]   Fax:([phone]) [place], [place] [address] [place] [address]  DIAGNOSIS: Stage IV (T2a, N0, M1b) non-small cell lung cancer, adenocarcinoma with negative EGFR and ALK mutations diagnosed in January 2015 and presented with right upper lobe lung mass in addition to a solitary brain metastasis.  PRIOR THERAPY: 1) Status post stereotactic radiotherapy to a solitary right parietal brain lesion under the care of Dr. [name] on 10/16/2013. 2) Status post palliative radiotherapy to the right lung tumor under the care of Dr. [name] completed on 12/05/2013. 3) Systemic chemotherapy with carboplatin for AUC of 5 and Alimta 500 mg/M2 every 3 weeks. First dose Jan 06 2014. Status post 6 cycles.  CURRENT THERAPY: Systemic chemotherapy with maintenance Alimta 500 MG/M2 every 3 weeks, status post 44 cycles.  INTERVAL HISTORY: Alexis [name] 66 y.o. female returns to the clinic today for follow-up visit accompanied by her husband. The patient is doing fine today with no specific complaints. She has been tolerating her treatment with maintenance Alimta fairly well was no significant adverse effects. She denied having any weight loss or night sweats. She has no nausea, vomiting, diarrhea or constipation. She denied having any fever or chills. She has no chest pain, shortness of breath, cough or hemoptysis. She had repeat CT scan of the chest, abdomen and pelvis performed recently and she is here for evaluation and discussion of her scan results.  MEDICAL HISTORY: Past Medical History:  Diagnosis Date  . Anxiety   . Anxiety 06/20/2016  . Cervical cancer ([facility])   . Encounter for antineoplastic chemotherapy 07/20/2015  . 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  MEDICATIONS:  Current Outpatient Prescriptions    Medication Sig Dispense Refill  . acetaminophen (TYLENOL) 500 MG tablet Take 500 mg by mouth every 6 (six) hours as needed for mild pain or headache. Reported on 11/02/2015    . ALPRAZolam (XANAX) 0.25 MG tablet Take 1 tablet (0.25 mg total) by mouth 3 (three) times daily as needed. for anxiety 45 tablet 0  . Ascorbic Acid (VITAMIN C GUMMIE PO) Take 1 each by mouth every morning.    Marland Kitchen dexamethasone (DECADRON) 4 MG tablet TAKE 1 TABLET BY MOUTH TWICE A DAY THE DAY BEFORE, DAY OF AND DAY AFTER THE CHEMOTHERAPY EVERY 3 WKS 40 tablet 1  . folic acid (FOLVITE) 1 MG tablet Take 1 tablet (1 mg total) by mouth daily. 90 tablet 0  . Multiple Vitamin (MULTIVITAMIN WITH MINERALS) TABS tablet Take 1 tablet by mouth every morning.    Marland Kitchen omeprazole (PRILOSEC) 20 MG capsule Take 1 capsule (20 mg total) by mouth daily. As needed for reflux or indigestion. 42 capsule PRN  . ondansetron (ZOFRAN) 8 MG tablet Take 1 tablet (8 mg total) by mouth every 8 (eight) hours as needed for nausea or vomiting. 30 tablet 0  . OVER THE COUNTER MEDICATION Take 1 tablet by mouth every morning. (Vitamin A)    . pentoxifylline (TRENTAL) 400 MG CR tablet TAKE ONE TABLET BY MOUTH TWICE DAILY, ALSO  TAKE  VITAMIN  E  400  UNITS  TWICE  DAILY  WITH  PENTOXIFYLLINE 60 tablet 0  . prochlorperazine (COMPAZINE) 10 MG tablet Take 1 tablet (10 mg total) by mouth every 6 (six) hours as  needed for nausea or vomiting. 60 tablet 0  . senna-docusate (SENOKOT-S) 8.6-50 MG tablet Take 1 tablet by mouth daily. 30 tablet prn   No current facility-administered medications for this visit.     SURGICAL HISTORY:  Past Surgical History:  Procedure Laterality Date  . ABDOMINAL HYSTERECTOMY    . COLOSTOMY TAKEDOWN N/A 07/10/2014   Procedure: LAPAROSCOPIC LYSIS OF ADHESIONS (90 MIN) LAPAROSCOPIC ASSISTED COLOSTOMY CLOSURE, RIGID PROCTOSIGMOIDOSCOPY;  Surgeon: Jackolyn Confer, MD;  Location: [facility];  Service: General;  Laterality: N/A;  . LAPAROTOMY N/A  11/03/2013   Procedure: EXPLORATORY LAPAROTOMY, DRAINAGE OF INTRA  ABDOMINAL ABSCESSES, MOBILIZATION OF SPLENIC FLEXURE, SIGMOID COLECTOMY WITH COLOSTOMY;  Surgeon: Odis Hollingshead, MD;  Location: [facility];  Service: General;  Laterality: N/A;  . VIDEO BRONCHOSCOPY Bilateral 08/30/2013   Procedure: VIDEO BRONCHOSCOPY WITH FLUORO;  Surgeon: Tanda Rockers, MD;  Location: [facility];  Service: Cardiopulmonary;  Laterality: Bilateral;    REVIEW OF SYSTEMS:  Constitutional: negative Eyes: negative Ears, nose, mouth, throat, and face: negative Respiratory: negative Cardiovascular: negative Gastrointestinal: negative Genitourinary:negative Integument/breast: negative Hematologic/lymphatic: negative Musculoskeletal:negative Neurological: negative Behavioral/Psych: negative Endocrine: negative Allergic/Immunologic: negative   PHYSICAL EXAMINATION: General appearance: alert, cooperative and no distress Head: Normocephalic, without obvious abnormality, atraumatic Neck: no adenopathy, no JVD, supple, symmetrical, trachea midline and thyroid not enlarged, symmetric, no tenderness/mass/nodules Lymph nodes: Cervical, supraclavicular, and axillary nodes normal. Resp: clear to auscultation bilaterally Back: symmetric, no curvature. ROM normal. No CVA tenderness. Cardio: regular rate and rhythm, S1, S2 normal, no murmur, click, rub or gallop GI: soft, non-tender; bowel sounds normal; no masses,  no organomegaly Extremities: extremities normal, atraumatic, no cyanosis or edema Neurologic: Alert and oriented X 3, normal strength and tone. Normal symmetric reflexes. Normal coordination and gait  ECOG PERFORMANCE STATUS: 0 - Asymptomatic  Blood pressure (!) 118/58, pulse 72, temperature 98.1 F (36.7 C), temperature source Oral, resp. rate 18, height 5' 4"  (1.626 m), weight 162 lb 12.8 oz (73.8 kg), SpO2 100 %.  LABORATORY DATA: Lab Results  Component Value Date   WBC 7.8 12/26/2016   HGB 11.9  12/26/2016   HCT 36.5 12/26/2016   MCV 98.9 12/26/2016   PLT 243 12/26/2016      Chemistry      Component Value Date/Time   NA 142 12/26/2016 0910   K 4.0 12/26/2016 0910   CL 100 07/12/2014 0512   CO2 23 12/26/2016 0910   BUN 14.2 12/26/2016 0910   CREATININE 0.8 12/26/2016 0910      Component Value Date/Time   CALCIUM 9.7 12/26/2016 0910   ALKPHOS 89 12/26/2016 0910   AST 13 12/26/2016 0910   ALT 18 12/26/2016 0910   BILITOT 0.41 12/26/2016 0910       RADIOGRAPHIC STUDIES: Ct Chest W Contrast  Result Date: 12/23/2016 CLINICAL DATA:  Lung cancer with brain metastases. EXAM: CT CHEST, ABDOMEN, AND PELVIS WITH CONTRAST TECHNIQUE: Multidetector CT imaging of the chest, abdomen and pelvis was performed following the standard protocol during bolus administration of intravenous contrast. CONTRAST:  158m ISOVUE-300 IOPAMIDOL (ISOVUE-300) INJECTION 61% COMPARISON:  10/21/2016.  06/17/2016. FINDINGS: CT CHEST FINDINGS Cardiovascular: The heart size is normal. No pericardial effusion. Coronary artery calcification is noted. Mediastinum/Nodes: No mediastinal lymphadenopathy. No evidence for gross hilar lymphadenopathy although assessment is limited by the lack of intravenous contrast on today's study. The esophagus has normal imaging features. There is no axillary lymphadenopathy. Lungs/Pleura: Biapical pleural-parenchymal scarring again noted with stable appearance of the adjacent nodular lesions in the  right apex. The more posterior of the 2 lesions measures 2.7 x 1.5 cm today compared to 2.6 x 1.8 cm previously. The more anterior of the 2 lesions measures 1.4 x 1.7 cm today compared to 1.1 x 1.8 cm previously. Changes of centrilobular and paraseptal emphysema again noted. No focal airspace consolidation. No pulmonary edema or pleural effusion. Musculoskeletal: Bone windows reveal no worrisome lytic or sclerotic osseous lesions. CT ABDOMEN PELVIS FINDINGS Hepatobiliary: No focal abnormality  within the liver parenchyma. There is no evidence for gallstones, gallbladder wall thickening, or pericholecystic fluid. No intrahepatic or extrahepatic biliary dilation. Pancreas: No focal mass lesion. No dilatation of the main duct. No intraparenchymal cyst. No peripancreatic edema. Spleen: No splenomegaly. No focal mass lesion. Adrenals/Urinary Tract: No adrenal nodule or mass. Kidneys are unremarkable. No evidence for hydroureter. The urinary bladder appears normal for the degree of distention. Stomach/Bowel: Stomach is nondistended. No gastric wall thickening. No evidence of outlet obstruction. Duodenum is normally positioned as is the ligament of Treitz. No small bowel wall thickening. No small bowel dilatation. The terminal ileum is normal. Diverticular changes are noted in the left colon without evidence of diverticulitis. Surgical anastomosis noted distal sigmoid colon. Vascular/Lymphatic: There is abdominal aortic atherosclerosis without aneurysm. There is no gastrohepatic or hepatoduodenal ligament lymphadenopathy. No intraperitoneal or retroperitoneal lymphadenopathy. No pelvic sidewall lymphadenopathy. Reproductive: Uterus surgically absent.  There is no adnexal mass. Other: No intraperitoneal free fluid. Musculoskeletal: Bone windows reveal no worrisome lytic or sclerotic osseous lesions. IMPRESSION: 1. Stable exam. No new or progressive findings in the chest, abdomen, or pelvis. 2. Two areas of nodular opacity in the right lung apex are unchanged in the interval. The larger of the 2 shows no substantial change since 06/25/2016. The smaller of the 2 nodules is new since 06/17/2016 but unchanged since 10/21/2016. Given no history of interval radiation to the right apex since 06/17/2016 continued close attention to this nodule on follow-up imaging is recommended. Electronically Signed   By: Misty Stanley M.D.   On: 12/23/2016 13:29   Ct Abdomen Pelvis W Contrast  Result Date: 12/23/2016 CLINICAL  DATA:  Lung cancer with brain metastases. EXAM: CT CHEST, ABDOMEN, AND PELVIS WITH CONTRAST TECHNIQUE: Multidetector CT imaging of the chest, abdomen and pelvis was performed following the standard protocol during bolus administration of intravenous contrast. CONTRAST:  132m ISOVUE-300 IOPAMIDOL (ISOVUE-300) INJECTION 61% COMPARISON:  10/21/2016.  06/17/2016. FINDINGS: CT CHEST FINDINGS Cardiovascular: The heart size is normal. No pericardial effusion. Coronary artery calcification is noted. Mediastinum/Nodes: No mediastinal lymphadenopathy. No evidence for gross hilar lymphadenopathy although assessment is limited by the lack of intravenous contrast on today's study. The esophagus has normal imaging features. There is no axillary lymphadenopathy. Lungs/Pleura: Biapical pleural-parenchymal scarring again noted with stable appearance of the adjacent nodular lesions in the right apex. The more posterior of the 2 lesions measures 2.7 x 1.5 cm today compared to 2.6 x 1.8 cm previously. The more anterior of the 2 lesions measures 1.4 x 1.7 cm today compared to 1.1 x 1.8 cm previously. Changes of centrilobular and paraseptal emphysema again noted. No focal airspace consolidation. No pulmonary edema or pleural effusion. Musculoskeletal: Bone windows reveal no worrisome lytic or sclerotic osseous lesions. CT ABDOMEN PELVIS FINDINGS Hepatobiliary: No focal abnormality within the liver parenchyma. There is no evidence for gallstones, gallbladder wall thickening, or pericholecystic fluid. No intrahepatic or extrahepatic biliary dilation. Pancreas: No focal mass lesion. No dilatation of the main duct. No intraparenchymal cyst. No peripancreatic edema. Spleen: No  splenomegaly. No focal mass lesion. Adrenals/Urinary Tract: No adrenal nodule or mass. Kidneys are unremarkable. No evidence for hydroureter. The urinary bladder appears normal for the degree of distention. Stomach/Bowel: Stomach is nondistended. No gastric wall  thickening. No evidence of outlet obstruction. Duodenum is normally positioned as is the ligament of Treitz. No small bowel wall thickening. No small bowel dilatation. The terminal ileum is normal. Diverticular changes are noted in the left colon without evidence of diverticulitis. Surgical anastomosis noted distal sigmoid colon. Vascular/Lymphatic: There is abdominal aortic atherosclerosis without aneurysm. There is no gastrohepatic or hepatoduodenal ligament lymphadenopathy. No intraperitoneal or retroperitoneal lymphadenopathy. No pelvic sidewall lymphadenopathy. Reproductive: Uterus surgically absent.  There is no adnexal mass. Other: No intraperitoneal free fluid. Musculoskeletal: Bone windows reveal no worrisome lytic or sclerotic osseous lesions. IMPRESSION: 1. Stable exam. No new or progressive findings in the chest, abdomen, or pelvis. 2. Two areas of nodular opacity in the right lung apex are unchanged in the interval. The larger of the 2 shows no substantial change since 06/25/2016. The smaller of the 2 nodules is new since 06/17/2016 but unchanged since 10/21/2016. Given no history of interval radiation to the right apex since 06/17/2016 continued close attention to this nodule on follow-up imaging is recommended. Electronically Signed   By: Misty Stanley M.D.   On: 12/23/2016 13:29    ASSESSMENT AND PLAN:  This is a very pleasant 66 years old white female with metastatic non-small cell lung cancer, adenocarcinoma status post induction systemic chemotherapy with carboplatin and Alimta with partial response. She is currently on maintenance treatment with single agent Alimta status post 44 cycles. She has been tolerating her treatment well with no significant adverse effects. She had repeat CT scan of the chest, abdomen and pelvis performed recently. I personally and independently reviewed the scan images and discuss the results with the patient and her husband. Her scan showed no clear evidence for  disease progression. I recommended for the patient to continue her current treatment with maintenance Alimta and she will proceed with cycle #45 today. For anxiety, I gave her a refill of Xanax today. I will see the patient back for follow-up visit in 3 weeks for evaluation before starting the next cycle of her treatment. She was advised to call immediately if she has any concerning symptoms in the interval. The patient voices understanding of current disease status and treatment options and is in agreement with the current care plan. All questions were answered. The patient knows to call the clinic with any problems, questions or concerns. We can certainly see the patient much sooner if necessary.  Disclaimer: This note was dictated with voice recognition software. Similar sounding words can inadvertently be transcribed and may not be corrected upon review.

## 2017-01-04 NOTE — Progress Notes (Signed)
Metastatic lung cancer with brain metastasis radiation completed, review 01-06-17 MRI brain w wo contrast, FU.  PAIN: She denies  having pain. NEURO:Alert and oriented x 3 with fluent speech,able to complete sentences without difficulty with word finding or organization of sentences. Reports blurred vision,cloudy vision at times and having floaters and headaches thinks it's from her eyes.  Has a eye appointment next Monday, 01-16-17.    Denies reports any nausea or vomiting,ataxia,ring of ears,dizziness. Fine motor movement-picking up objects with fingers,holding objects, writing, weakness of lower extremities:No problems.   Aphasia/Slurred speech:No problems SKIN:Warm and dry. Imaging:01-06-17 MRI brain w wo contrast, 12-23-16 CT chest w contrast and CT abdomen pelvis w contrast Lab:12-26-16 [facility], CBC w diff  OTHER: Reports acid reflux has improved. Wt Readings from Last 3 Encounters:  01/09/17 163 lb 9.6 oz (74.2 kg)  12/26/16 162 lb 12.8 oz (73.8 kg)  12/05/16 162 lb 4.8 oz (73.6 kg)  BP (!) 110/49   Pulse 91   Temp 97.9 F (36.6 C) (Oral)   Resp 18   Ht '5\' 4"'$  (1.626 m)   Wt 163 lb 9.6 oz (74.2 kg)   SpO2 100%   BMI 28.08 kg/m

## 2017-01-05 ENCOUNTER — Other Ambulatory Visit: Payer: Medicare Other

## 2017-01-06 ENCOUNTER — Ambulatory Visit
Admission: RE | Admit: 2017-01-06 | Discharge: 2017-01-06 | Disposition: A | Discharge status: Home / Self Care | Payer: Medicare Other | Source: Ambulatory Visit | Attending: Radiation Oncology | Admitting: Radiation Oncology

## 2017-01-06 DIAGNOSIS — C7931 Secondary malignant neoplasm of brain: Secondary | ICD-10-CM

## 2017-01-06 DIAGNOSIS — C3411 Malignant neoplasm of upper lobe, right bronchus or lung: Secondary | ICD-10-CM | POA: Diagnosis not present

## 2017-01-06 DIAGNOSIS — C7949 Secondary malignant neoplasm of other parts of nervous system: Principal | ICD-10-CM

## 2017-01-06 MED ORDER — GADOBENATE DIMEGLUMINE 529 MG/ML IV SOLN
15.0000 mL | Freq: Once | INTRAVENOUS | Status: AC | PRN
  Administered 2017-01-06: 15 mL via INTRAVENOUS

## 2017-01-09 ENCOUNTER — Encounter: Payer: Self-pay | Admitting: Radiation Oncology

## 2017-01-09 ENCOUNTER — Ambulatory Visit
Admission: RE | Admit: 2017-01-09 | Discharge: 2017-01-09 | Disposition: A | Discharge status: Home / Self Care | Payer: Medicare Other | Source: Ambulatory Visit | Attending: Radiation Oncology | Admitting: Radiation Oncology

## 2017-01-09 VITALS — BP 110/49 | HR 91 | Temp 97.9°F | Resp 18 | Ht 64.0 in | Wt 163.6 lb

## 2017-01-09 DIAGNOSIS — Z825 Family history of asthma and other chronic lower respiratory diseases: Secondary | ICD-10-CM | POA: Insufficient documentation

## 2017-01-09 DIAGNOSIS — Z87891 Personal history of nicotine dependence: Secondary | ICD-10-CM | POA: Insufficient documentation

## 2017-01-09 DIAGNOSIS — Z8249 Family history of ischemic heart disease and other diseases of the circulatory system: Secondary | ICD-10-CM | POA: Insufficient documentation

## 2017-01-09 DIAGNOSIS — C7931 Secondary malignant neoplasm of brain: Secondary | ICD-10-CM | POA: Diagnosis not present

## 2017-01-09 DIAGNOSIS — Z9889 Other specified postprocedural states: Secondary | ICD-10-CM | POA: Diagnosis not present

## 2017-01-09 DIAGNOSIS — T66XXXA Radiation sickness, unspecified, initial encounter: Secondary | ICD-10-CM | POA: Diagnosis not present

## 2017-01-09 DIAGNOSIS — Z801 Family history of malignant neoplasm of trachea, bronchus and lung: Secondary | ICD-10-CM | POA: Insufficient documentation

## 2017-01-09 DIAGNOSIS — Z79899 Other long term (current) drug therapy: Secondary | ICD-10-CM | POA: Insufficient documentation

## 2017-01-09 DIAGNOSIS — F419 Anxiety disorder, unspecified: Secondary | ICD-10-CM | POA: Diagnosis not present

## 2017-01-09 DIAGNOSIS — Z885 Allergy status to narcotic agent status: Secondary | ICD-10-CM | POA: Diagnosis not present

## 2017-01-09 DIAGNOSIS — C3411 Malignant neoplasm of upper lobe, right bronchus or lung: Secondary | ICD-10-CM

## 2017-01-09 DIAGNOSIS — Z90710 Acquired absence of both cervix and uterus: Secondary | ICD-10-CM | POA: Diagnosis not present

## 2017-01-09 DIAGNOSIS — Z9221 Personal history of antineoplastic chemotherapy: Secondary | ICD-10-CM | POA: Diagnosis not present

## 2017-01-09 DIAGNOSIS — Y842 Radiological procedure and radiotherapy as the cause of abnormal reaction of the patient, or of later complication, without mention of misadventure at the time of the procedure: Secondary | ICD-10-CM

## 2017-01-09 DIAGNOSIS — T66XXXD Radiation sickness, unspecified, subsequent encounter: Secondary | ICD-10-CM | POA: Diagnosis not present

## 2017-01-09 DIAGNOSIS — Z8589 Personal history of malignant neoplasm of other organs and systems: Secondary | ICD-10-CM | POA: Insufficient documentation

## 2017-01-09 DIAGNOSIS — Z08 Encounter for follow-up examination after completed treatment for malignant neoplasm: Secondary | ICD-10-CM | POA: Diagnosis not present

## 2017-01-09 DIAGNOSIS — C7949 Secondary malignant neoplasm of other parts of nervous system: Secondary | ICD-10-CM

## 2017-01-09 DIAGNOSIS — I6789 Other cerebrovascular disease: Secondary | ICD-10-CM

## 2017-01-09 NOTE — Addendum Note (Signed)
Encounter addended by: Malena Edman, RN on: 01/09/2017 12:54 PM<BR>    Actions taken: Charge Capture section accepted

## 2017-01-09 NOTE — Progress Notes (Addendum)
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   Interval Since Last Radiation:  2 years, 1 month  10/28/2013 through 12/05/2013: The patient was treated to the right lung tumor to a dose of 50 gray in 25 fractions using a 3-D conformal technique. Daily image guidance was used for the patient's treatment.  10/16/2013: Rt Parietal 74m target was treated using 3 Arcs to a prescription dose of 20 Gy. ExacTrac Snap verification was performed for each couch angle.  Narrative:  The patient returns today for routine follow-up. In summary this is a 66y.o. patient with a history of metastatic lung cancer to the brain who was treated in two sessions in 2015 for her brain disease. Since her treatment, she has done well and continues to be NED in the brain, and on systemic alimta. 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 She had a repeat MRI of the brain on 01/06/17, which revealed continued stability of her post treatment brain.   On review of systems, the patient reports that she is doing well overall. She denies any chest pain, shortness of breath, cough, fevers, chills, night sweats, unintended weight changes. She denies any bowel or bladder disturbances, and denies abdominal pain, nausea or vomiting. She denies any new musculoskeletal or joint aches or pains. A complete review of systems is obtained and is otherwise negative.  Past Medical History:  Past Medical History:  Diagnosis Date  . Anxiety   . Anxiety 06/20/2016  . Cervical cancer ([facility]   . Encounter for antineoplastic  chemotherapy 07/20/2015  . 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 ABDOMINAL HYSTERECTOMY    . COLOSTOMY TAKEDOWN N/A 07/10/2014   Procedure: LAPAROSCOPIC LYSIS OF ADHESIONS (90 MIN) LAPAROSCOPIC ASSISTED COLOSTOMY CLOSURE, RIGID PROCTOSIGMOIDOSCOPY;  Surgeon: TJackolyn Confer MD;  Location: [facility];  Service: General;  Laterality: N/A;  . LAPAROTOMY N/A 11/03/2013   Procedure: EXPLORATORY LAPAROTOMY, DRAINAGE OF INTRA  ABDOMINAL ABSCESSES, MOBILIZATION OF SPLENIC FLEXURE, SIGMOID COLECTOMY WITH COLOSTOMY;  Surgeon: TOdis Hollingshead MD;  Location: [facility];  Service: General;  Laterality: N/A;  . VIDEO BRONCHOSCOPY Bilateral 08/30/2013   Procedure: VIDEO BRONCHOSCOPY WITH FLUORO;  Surgeon: MTanda Rockers MD;  [facility];  Service: Cardiopulmonary;  Laterality: Bilateral;    Social History:  Social History   Social History  . Marital status: Married    Spouse name: N/A  . Number of children: N/A  . Years of education: N/A   Occupational History  . 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 Smoking status: Former Smoker    Packs/day: 1.00    Years: 40.00    Types: Cigarettes    Quit date: 09/27/2013  . Smokeless tobacco: Never Used  . Alcohol use No  . Drug use: No  . Sexual activity: Yes   Other Topics Concern  . Not on file   Social History Narrative  .  No narrative on file    Family History: Family History  Problem Relation Age of Onset  . Emphysema Father        smoked  . Lung cancer Father        smoked  . Cancer Mother   . Hypertension Mother   . COPD Mother     ALLERGIES:  is allergic to codeine.  Meds: Current Outpatient Prescriptions  Medication Sig Dispense Refill  . acetaminophen (TYLENOL) 500 MG tablet Take 500 mg by mouth every 6 (six) hours as needed for  mild pain or headache. Reported on 11/02/2015    . ALPRAZolam (XANAX) 0.25 MG tablet Take 1 tablet (0.25 mg total) by mouth 3 (three) times daily as needed. for anxiety 45 tablet 0  . Ascorbic Acid (VITAMIN C GUMMIE PO) Take 1 each by mouth every morning.    Marland Kitchen dexamethasone (DECADRON) 4 MG tablet TAKE 1 TABLET BY MOUTH TWICE A DAY THE DAY BEFORE, DAY OF AND DAY AFTER THE CHEMOTHERAPY EVERY 3 WKS 40 tablet 1  . folic acid (FOLVITE) 1 MG tablet Take 1 tablet (1 mg total) by mouth daily. 90 tablet 0  . Multiple Vitamin (MULTIVITAMIN WITH MINERALS) TABS tablet Take 1 tablet by mouth every morning.    Marland Kitchen omeprazole (PRILOSEC) 20 MG capsule Take 1 capsule (20 mg total) by mouth daily. As needed for reflux or indigestion. 42 capsule PRN  . ondansetron (ZOFRAN) 8 MG tablet Take 1 tablet (8 mg total) by mouth every 8 (eight) hours as needed for nausea or vomiting. 30 tablet 0  . OVER THE COUNTER MEDICATION Take 1 tablet by mouth every morning. (Vitamin A)    . pentoxifylline (TRENTAL) 400 MG CR tablet TAKE ONE TABLET BY MOUTH TWICE DAILY, ALSO  TAKE  VITAMIN  E  400  UNITS  TWICE  DAILY  WITH  PENTOXIFYLLINE 60 tablet 0  . senna-docusate (SENOKOT-S) 8.6-50 MG tablet Take 1 tablet by mouth daily. 30 tablet prn  . prochlorperazine (COMPAZINE) 10 MG tablet Take 1 tablet (10 mg total) by mouth every 6 (six) hours as needed for nausea or vomiting. (Patient not taking: Reported on 01/09/2017) 60 tablet 0   No current facility-administered medications for this encounter.     Physical Findings:  height is '5\' 4"'$  (1.626 m) and weight is 163 lb 9.6 oz (74.2 kg). Her oral temperature is 97.9 F (36.6 C). Her blood pressure is 110/49 (abnormal) and her pulse is 91. Her respiration is 18 and oxygen saturation is 100%.  In general this is a well appearing Caucasian female in no acute distress. She's alert and oriented x4 and appropriate throughout the examination. She is normocephalic, atraumatic. EOMS are intact.  Cardiopulmonary assessment is negative for acute distress and she exhibits normal effort. She appears to be grossly intact from a neurologic perspective.  Lab Findings: Lab Results  Component Value Date   WBC 7.8 12/26/2016   HGB 11.9 12/26/2016   HCT 36.5 12/26/2016   MCV 98.9 12/26/2016   PLT 243 12/26/2016     Radiographic Findings: Ct Chest W Contrast  Result Date: 12/23/2016 CLINICAL DATA:  Lung cancer with brain metastases. EXAM: CT CHEST, ABDOMEN, AND PELVIS WITH CONTRAST TECHNIQUE: Multidetector CT imaging of the chest, abdomen and pelvis was performed following the standard protocol during bolus administration of intravenous contrast. CONTRAST:  130m ISOVUE-300 IOPAMIDOL (ISOVUE-300) INJECTION 61% COMPARISON:  10/21/2016.  06/17/2016. FINDINGS: CT CHEST FINDINGS Cardiovascular: The heart size is normal. No pericardial effusion. Coronary  artery calcification is noted. Mediastinum/Nodes: No mediastinal lymphadenopathy. No evidence for gross hilar lymphadenopathy although assessment is limited by the lack of intravenous contrast on today's study. The esophagus has normal imaging features. There is no axillary lymphadenopathy. Lungs/Pleura: Biapical pleural-parenchymal scarring again noted with stable appearance of the adjacent nodular lesions in the right apex. The more posterior of the 2 lesions measures 2.7 x 1.5 cm today compared to 2.6 x 1.8 cm previously. The more anterior of the 2 lesions measures 1.4 x 1.7 cm today compared to 1.1 x 1.8 cm previously. Changes of centrilobular and paraseptal emphysema again noted. No focal airspace consolidation. No pulmonary edema or pleural effusion. Musculoskeletal: Bone windows reveal no worrisome lytic or sclerotic osseous lesions. CT ABDOMEN PELVIS FINDINGS Hepatobiliary: No focal abnormality within the liver parenchyma. There is no evidence for gallstones, gallbladder wall thickening, or pericholecystic fluid. No intrahepatic or extrahepatic  biliary dilation. Pancreas: No focal mass lesion. No dilatation of the main duct. No intraparenchymal cyst. No peripancreatic edema. Spleen: No splenomegaly. No focal mass lesion. Adrenals/Urinary Tract: No adrenal nodule or mass. Kidneys are unremarkable. No evidence for hydroureter. The urinary bladder appears normal for the degree of distention. Stomach/Bowel: Stomach is nondistended. No gastric wall thickening. No evidence of outlet obstruction. Duodenum is normally positioned as is the ligament of Treitz. No small bowel wall thickening. No small bowel dilatation. The terminal ileum is normal. Diverticular changes are noted in the left colon without evidence of diverticulitis. Surgical anastomosis noted distal sigmoid colon. Vascular/Lymphatic: There is abdominal aortic atherosclerosis without aneurysm. There is no gastrohepatic or hepatoduodenal ligament lymphadenopathy. No intraperitoneal or retroperitoneal lymphadenopathy. No pelvic sidewall lymphadenopathy. Reproductive: Uterus surgically absent.  There is no adnexal mass. Other: No intraperitoneal free fluid. Musculoskeletal: Bone windows reveal no worrisome lytic or sclerotic osseous lesions. IMPRESSION: 1. Stable exam. No new or progressive findings in the chest, abdomen, or pelvis. 2. Two areas of nodular opacity in the right lung apex are unchanged in the interval. The larger of the 2 shows no substantial change since 06/25/2016. The smaller of the 2 nodules is new since 06/17/2016 but unchanged since 10/21/2016. Given no history of interval radiation to the right apex since 06/17/2016 continued close attention to this nodule on follow-up imaging is recommended. Electronically Signed   By: Misty Stanley M.D.   On: 12/23/2016 13:29   Alexis [name] FT Contrast  Result Date: 01/06/2017 CLINICAL DATA:  66 year old female with non-small cell lung cancer of the right upper lobe. Right parietal 9 mm metastasis was treated with radiation in February 2015.  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 Restaging. Subsequent encounter. EXAM: MRI HEAD WITHOUT AND WITH CONTRAST TECHNIQUE: Multiplanar, multiecho pulse sequences of the brain and surrounding structures were obtained without and with intravenous contrast. CONTRAST:  31m MULTIHANCE GADOBENATE DIMEGLUMINE 529 MG/ML IV SOLN COMPARISON:  07/07/16 and earlier. FINDINGS: Brain: Confluent T2 and FLAIR hyperintensity in the anterolateral right parietal lobe with mild gyral expansion is stable since July 2017. Underlying 15-16 mm nodular lesion with mild hemosiderin (series 10, image 106) remains stable in size and configuration. No new abnormal intracranial enhancement. No dural thickening. No leptomeningeal enhancement. Stable gray and white matter signal elsewhere, including scattered chronic probably small vessel disease related nonspecific cerebral white matter T2 and FLAIR hyperintensity without mass effect. No restricted diffusion to suggest acute infarction. No midline shift, ventriculomegaly, extra-axial collection or acute intracranial hemorrhage. Cervicomedullary junction and pituitary are  within normal limits. No other chronic cerebral blood products. No cortical encephalomalacia. Vascular: Major intracranial vascular flow voids are stable. Skull and upper cervical spine: Negative visible cervical spine and spinal cord. Visualized bone marrow signal is within normal limits. Sinuses/Orbits: Stable and negative. Other: Visible internal auditory structures appear normal. Small left posterior convexity probably proteinaceous scalp soft tissue nodule remains stable and without enhancement. IMPRESSION: Continued stable post treatment appearance of the brain. No new metastatic disease. 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 Electronically Signed   By: Genevie Ann M.D.   On: 01/06/2017 11:41   Ct Abdomen Pelvis W  Contrast  Result Date: 12/23/2016 CLINICAL DATA:  Lung cancer with brain metastases. EXAM: CT CHEST, ABDOMEN, AND PELVIS WITH CONTRAST TECHNIQUE: Multidetector CT imaging of the chest, abdomen and pelvis was performed following the standard protocol during bolus administration of intravenous contrast. CONTRAST:  169m ISOVUE-300 IOPAMIDOL (ISOVUE-300) INJECTION 61% COMPARISON:  10/21/2016.  06/17/2016. FINDINGS: CT CHEST FINDINGS Cardiovascular: The heart size is normal. No pericardial effusion. Coronary artery calcification is noted. Mediastinum/Nodes: No mediastinal lymphadenopathy. No evidence for gross hilar lymphadenopathy although assessment is limited by the lack of intravenous contrast on today's study. The esophagus has normal imaging features. There is no axillary lymphadenopathy. Lungs/Pleura: Biapical pleural-parenchymal scarring again noted with stable appearance of the adjacent nodular lesions in the right apex. The more posterior of the 2 lesions measures 2.7 x 1.5 cm today compared to 2.6 x 1.8 cm previously. The more anterior of the 2 lesions measures 1.4 x 1.7 cm today compared to 1.1 x 1.8 cm previously. Changes of centrilobular and paraseptal emphysema again noted. No focal airspace consolidation. No pulmonary edema or pleural effusion. Musculoskeletal: Bone windows reveal no worrisome lytic or sclerotic osseous lesions. CT ABDOMEN PELVIS FINDINGS Hepatobiliary: No focal abnormality within the liver parenchyma. There is no evidence for gallstones, gallbladder wall thickening, or pericholecystic fluid. No intrahepatic or extrahepatic biliary dilation. Pancreas: No focal mass lesion. No dilatation of the main duct. No intraparenchymal cyst. No peripancreatic edema. Spleen: No splenomegaly. No focal mass lesion. Adrenals/Urinary Tract: No adrenal nodule or mass. Kidneys are unremarkable. No evidence for hydroureter. The urinary bladder appears normal for the degree of distention. Stomach/Bowel:  Stomach is nondistended. No gastric wall thickening. No evidence of outlet obstruction. Duodenum is normally positioned as is the ligament of Treitz. No small bowel wall thickening. No small bowel dilatation. The terminal ileum is normal. Diverticular changes are noted in the left colon without evidence of diverticulitis. Surgical anastomosis noted distal sigmoid colon. Vascular/Lymphatic: There is abdominal aortic atherosclerosis without aneurysm. There is no gastrohepatic or hepatoduodenal ligament lymphadenopathy. No intraperitoneal or retroperitoneal lymphadenopathy. No pelvic sidewall lymphadenopathy. Reproductive: Uterus surgically absent.  There is no adnexal mass. Other: No intraperitoneal free fluid. Musculoskeletal: Bone windows reveal no worrisome lytic or sclerotic osseous lesions. IMPRESSION: 1. Stable exam. No new or progressive findings in the chest, abdomen, or pelvis. 2. Two areas of nodular opacity in the right lung apex are unchanged in the interval. The larger of the 2 shows no substantial change since 06/25/2016. The smaller of the 2 nodules is new since 06/17/2016 but unchanged since 10/21/2016. Given no history of interval radiation to the right apex since 06/17/2016 continued close attention to this nodule on follow-up imaging is recommended. Electronically Signed   By: EMisty StanleyM.D.   On: 12/23/2016 13:29    Impression/Plan: 1. Stage IV (T2a, N0, M1b) non-small cell lung cancer of the  right upper lobe consistent with adenocarcinoma with metastasis to the brain. The patient continues to be stable from her disease in the CNS. She will continue with imaging every 6 months, however again understands the risks of missing opportunities to diagnose small recurrences, and is still in agreement to keep this time frame for scans, knowing that if she has any concerns or symptoms she's unsure of to contact us sooner. She states agreement and we will see her back in 6 months following her MRI.  She will continue as well on Alimta with Dr. Julien Nordmann.  2. 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 She will continue this and keep [place] informed of any questions or concerns regarding this. She will let us know if she needs refills as well.      Carola Rhine, PAC

## 2017-01-16 ENCOUNTER — Other Ambulatory Visit (HOSPITAL_BASED_OUTPATIENT_CLINIC_OR_DEPARTMENT_OTHER): Payer: Medicare Other

## 2017-01-16 ENCOUNTER — Ambulatory Visit (HOSPITAL_BASED_OUTPATIENT_CLINIC_OR_DEPARTMENT_OTHER): Payer: Medicare Other

## 2017-01-16 ENCOUNTER — Telehealth: Payer: Self-pay | Admitting: Internal Medicine

## 2017-01-16 ENCOUNTER — Ambulatory Visit (HOSPITAL_BASED_OUTPATIENT_CLINIC_OR_DEPARTMENT_OTHER): Payer: Medicare Other | Admitting: Internal Medicine

## 2017-01-16 ENCOUNTER — Encounter: Payer: Self-pay | Admitting: Internal Medicine

## 2017-01-16 VITALS — BP 111/53 | HR 67 | Temp 98.0°F | Resp 18 | Ht 64.0 in | Wt 163.1 lb

## 2017-01-16 DIAGNOSIS — C3411 Malignant neoplasm of upper lobe, right bronchus or lung: Secondary | ICD-10-CM

## 2017-01-16 DIAGNOSIS — Z5111 Encounter for antineoplastic chemotherapy: Secondary | ICD-10-CM

## 2017-01-16 DIAGNOSIS — C7931 Secondary malignant neoplasm of brain: Secondary | ICD-10-CM | POA: Diagnosis not present

## 2017-01-16 DIAGNOSIS — H2513 Age-related nuclear cataract, bilateral: Secondary | ICD-10-CM | POA: Diagnosis not present

## 2017-01-16 DIAGNOSIS — C7949 Secondary malignant neoplasm of other parts of nervous system: Secondary | ICD-10-CM

## 2017-01-16 LAB — CBC WITH DIFFERENTIAL/PLATELET
BASO%: 0.3 % (ref 0.0–2.0)
Basophils Absolute: 0 10*3/uL (ref 0.0–0.1)
EOS%: 0 % (ref 0.0–7.0)
Eosinophils Absolute: 0 10*3/uL (ref 0.0–0.5)
HCT: 36.4 % (ref 34.8–46.6)
HGB: 12.3 g/dL (ref 11.6–15.9)
LYMPH%: 4.1 % — ABNORMAL LOW (ref 14.0–49.7)
MCH: 33 pg (ref 25.1–34.0)
MCHC: 33.7 g/dL (ref 31.5–36.0)
MCV: 98.1 fL (ref 79.5–101.0)
MONO#: 0.7 10*3/uL (ref 0.1–0.9)
MONO%: 6.9 % (ref 0.0–14.0)
NEUT#: 8.4 10*3/uL — ABNORMAL HIGH (ref 1.5–6.5)
NEUT%: 88.7 % — ABNORMAL HIGH (ref 38.4–76.8)
Platelets: 266 10*3/uL (ref 145–400)
RBC: 3.71 10*6/uL (ref 3.70–5.45)
RDW: 14.8 % — ABNORMAL HIGH (ref 11.2–14.5)
WBC: 9.4 10*3/uL (ref 3.9–10.3)
lymph#: 0.4 10*3/uL — ABNORMAL LOW (ref 0.9–3.3)

## 2017-01-16 LAB — COMPREHENSIVE METABOLIC PANEL
ALT: 14 U/L (ref 0–55)
AST: 12 U/L (ref 5–34)
Albumin: 3.6 g/dL (ref 3.5–5.0)
Alkaline Phosphatase: 93 U/L (ref 40–150)
Anion Gap: 11 mEq/L (ref 3–11)
BUN: 15.9 mg/dL (ref 7.0–26.0)
CO2: 26 mEq/L (ref 22–29)
Calcium: 9.5 mg/dL (ref 8.4–10.4)
Chloride: 106 mEq/L (ref 98–109)
Creatinine: 0.8 mg/dL (ref 0.6–1.1)
EGFR: 80 mL/min/{1.73_m2} — ABNORMAL LOW (ref >90)
Glucose: 122 mg/dl (ref 70–140)
Potassium: 4 mEq/L (ref 3.5–5.1)
Sodium: 143 mEq/L (ref 136–145)
Total Bilirubin: 0.39 mg/dL (ref 0.20–1.20)
Total Protein: 6.8 g/dL (ref 6.4–8.3)

## 2017-01-16 MED ORDER — SODIUM CHLORIDE 0.9 % IJ SOLN
10.0000 mL | INTRAMUSCULAR | Status: DC | PRN
  Filled 2017-01-16: qty 10

## 2017-01-16 MED ORDER — SODIUM CHLORIDE 0.9 % IV SOLN
500.0000 mg/m2 | Freq: Once | INTRAVENOUS | Status: AC
  Administered 2017-01-16: 900 mg via INTRAVENOUS
  Filled 2017-01-16: qty 20

## 2017-01-16 MED ORDER — DEXAMETHASONE SODIUM PHOSPHATE 10 MG/ML IJ SOLN
10.0000 mg | Freq: Once | INTRAMUSCULAR | Status: AC
  Administered 2017-01-16: 10 mg via INTRAVENOUS

## 2017-01-16 MED ORDER — ONDANSETRON HCL 8 MG PO TABS
ORAL_TABLET | ORAL | Status: AC
  Filled 2017-01-16: qty 1

## 2017-01-16 MED ORDER — DEXAMETHASONE SODIUM PHOSPHATE 10 MG/ML IJ SOLN
INTRAMUSCULAR | Status: AC
  Filled 2017-01-16: qty 1

## 2017-01-16 MED ORDER — HEPARIN SOD (PORK) LOCK FLUSH 100 UNIT/ML IV SOLN
500.0000 [IU] | Freq: Once | INTRAVENOUS | Status: DC | PRN
  Filled 2017-01-16: qty 5

## 2017-01-16 MED ORDER — SODIUM CHLORIDE 0.9 % IV SOLN
Freq: Once | INTRAVENOUS | Status: AC
  Administered 2017-01-16: 10:00:00 via INTRAVENOUS

## 2017-01-16 MED ORDER — ONDANSETRON HCL 8 MG PO TABS
8.0000 mg | ORAL_TABLET | Freq: Once | ORAL | Status: AC
  Administered 2017-01-16: 8 mg via ORAL

## 2017-01-16 NOTE — Patient Instructions (Signed)
[facility] Discharge Instructions for Patients Receiving Chemotherapy  Today you received the following chemotherapy agents Alimta.  To help prevent nausea and vomiting after your treatment, we encourage you to take your nausea medication as prescribed.   If you develop nausea and vomiting that is not controlled by your nausea medication, call the clinic.   BELOW ARE SYMPTOMS THAT SHOULD BE REPORTED IMMEDIATELY:  *FEVER GREATER THAN 100.5 F  *CHILLS WITH OR WITHOUT FEVER  NAUSEA AND VOMITING THAT IS NOT CONTROLLED WITH YOUR NAUSEA MEDICATION  *UNUSUAL SHORTNESS OF BREATH  *UNUSUAL BRUISING OR BLEEDING  TENDERNESS IN MOUTH AND THROAT WITH OR WITHOUT PRESENCE OF ULCERS  *URINARY PROBLEMS  *BOWEL PROBLEMS  UNUSUAL RASH Items with * indicate a potential emergency and should be followed up as soon as possible.  Feel free to call the clinic you have any questions or concerns. The clinic phone number is [phone].  Please show the CHEMO ALERT CARD at check-in to the Emergency Department and triage nurse.   

## 2017-01-16 NOTE — Telephone Encounter (Signed)
Gave patient AVS and calender per 5/21 los.

## 2017-01-16 NOTE — Progress Notes (Signed)
[facility] [phone]   [phone]  OFFICE PROGRESS NOTE  [name], MD [address] [place] [address]  DIAGNOSIS: Stage IV (T2a, N0, M1b) non-small cell lung cancer, adenocarcinoma with negative EGFR and ALK mutations diagnosed in January 2015 and presented with right upper lobe lung mass in addition to a solitary brain metastasis.  PRIOR THERAPY: 1) Status post stereotactic radiotherapy to a solitary right parietal brain lesion under the care of Dr. [name] on 10/16/2013. 2) Status post palliative radiotherapy to the right lung tumor under the care of Dr. [name] completed on 12/05/2013. 3) Systemic chemotherapy with carboplatin for AUC of 5 and Alimta 500 mg/M2 every 3 weeks. First dose Jan 06 2014. Status post 6 cycles.  CURRENT THERAPY: Systemic chemotherapy with maintenance Alimta 500 MG/M2 every 3 weeks, status post 45 cycles.  INTERVAL HISTORY: Alexis [name] 66 y.o. female returns to the clinic today for follow-up visit. The patient is feeling fine today with no specific complaints. She is tolerating her current treatment with maintenance Alimta fairly well. She denied having any fever or chills. She has no nausea, vomiting, diarrhea or constipation. She denied having any weight loss or night sweats. She has an appointment with an eye doctor later today for evaluation of blurry vision. The patient is here today for evaluation before starting cycle #46.    MEDICAL HISTORY: Past Medical History:  Diagnosis Date  . Anxiety   . Anxiety 06/20/2016  . Cervical cancer ([facility])   . Encounter for antineoplastic chemotherapy 07/20/2015  . 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  MEDICATIONS:  Current Outpatient Prescriptions  Medication Sig Dispense Refill  . acetaminophen (TYLENOL) 500 MG tablet Take 500 mg by mouth every 6  (six) hours as needed for mild pain or headache. Reported on 11/02/2015    . ALPRAZolam (XANAX) 0.25 MG tablet Take 1 tablet (0.25 mg total) by mouth 3 (three) times daily as needed. for anxiety 45 tablet 0  . Ascorbic Acid (VITAMIN C GUMMIE PO) Take 1 each by mouth every morning.    Marland Kitchen dexamethasone (DECADRON) 4 MG tablet TAKE 1 TABLET BY MOUTH TWICE A DAY THE DAY BEFORE, DAY OF AND DAY AFTER THE CHEMOTHERAPY EVERY 3 WKS 40 tablet 1  . folic acid (FOLVITE) 1 MG tablet Take 1 tablet (1 mg total) by mouth daily. 90 tablet 0  . Multiple Vitamin (MULTIVITAMIN WITH MINERALS) TABS tablet Take 1 tablet by mouth every morning.    Marland Kitchen omeprazole (PRILOSEC) 20 MG capsule Take 1 capsule (20 mg total) by mouth daily. As needed for reflux or indigestion. 42 capsule PRN  . OVER THE COUNTER MEDICATION Take 1 tablet by mouth every morning. (Vitamin A)    . pentoxifylline (TRENTAL) 400 MG CR tablet TAKE ONE TABLET BY MOUTH TWICE DAILY, ALSO  TAKE  VITAMIN  E  400  UNITS  TWICE  DAILY  WITH  PENTOXIFYLLINE 60 tablet 0  . senna-docusate (SENOKOT-S) 8.6-50 MG tablet Take 1 tablet by mouth daily. 30 tablet prn  . ondansetron (ZOFRAN) 8 MG tablet Take 1 tablet (8 mg total) by mouth every 8 (eight) hours as needed for nausea or vomiting. (Patient not taking: Reported on 01/16/2017) 30 tablet 0  . prochlorperazine (COMPAZINE) 10 MG tablet Take 1 tablet (10 mg total) by mouth  every 6 (six) hours as needed for nausea or vomiting. (Patient not taking: Reported on 01/09/2017) 60 tablet 0   No current facility-administered medications for this visit.     SURGICAL HISTORY:  Past Surgical History:  Procedure Laterality Date  . ABDOMINAL HYSTERECTOMY    . COLOSTOMY TAKEDOWN N/A 07/10/2014   Procedure: LAPAROSCOPIC LYSIS OF ADHESIONS (90 MIN) LAPAROSCOPIC ASSISTED COLOSTOMY CLOSURE, RIGID PROCTOSIGMOIDOSCOPY;  Surgeon: Jackolyn Confer, MD;  Location: [facility];  Service: General;  Laterality: N/A;  . LAPAROTOMY N/A 11/03/2013    Procedure: EXPLORATORY LAPAROTOMY, DRAINAGE OF INTRA  ABDOMINAL ABSCESSES, MOBILIZATION OF SPLENIC FLEXURE, SIGMOID COLECTOMY WITH COLOSTOMY;  Surgeon: Odis Hollingshead, MD;  Location: [facility];  Service: General;  Laterality: N/A;  . VIDEO BRONCHOSCOPY Bilateral 08/30/2013   Procedure: VIDEO BRONCHOSCOPY WITH FLUORO;  Surgeon: Tanda Rockers, MD;  Location: [facility];  Service: Cardiopulmonary;  Laterality: Bilateral;    REVIEW OF SYSTEMS:  A comprehensive review of systems was negative.   PHYSICAL EXAMINATION: General appearance: alert, cooperative and no distress Head: Normocephalic, without obvious abnormality, atraumatic Neck: no adenopathy, no JVD, supple, symmetrical, trachea midline and thyroid not enlarged, symmetric, no tenderness/mass/nodules Lymph nodes: Cervical, supraclavicular, and axillary nodes normal. Resp: clear to auscultation bilaterally Back: symmetric, no curvature. ROM normal. No CVA tenderness. Cardio: regular rate and rhythm, S1, S2 normal, no murmur, click, rub or gallop GI: soft, non-tender; bowel sounds normal; no masses,  no organomegaly Extremities: extremities normal, atraumatic, no cyanosis or edema  ECOG PERFORMANCE STATUS: 0 - Asymptomatic  Blood pressure (!) 111/53, pulse 67, temperature 98 F (36.7 C), temperature source Oral, resp. rate 18, height _0  (1.626 m), weight 163 lb 1.6 oz (74 kg), SpO2 100 %.  LABORATORY DATA: Lab Results  Component Value Date   WBC 9.4 01/16/2017   HGB 12.3 01/16/2017   HCT 36.4 01/16/2017   MCV 98.1 01/16/2017   PLT 266 01/16/2017      Chemistry      Component Value Date/Time   NA 142 12/26/2016 0910   K 4.0 12/26/2016 0910   CL 100 07/12/2014 0512   CO2 23 12/26/2016 0910   BUN 14.2 12/26/2016 0910   CREATININE 0.8 12/26/2016 0910      Component Value Date/Time   CALCIUM 9.7 12/26/2016 0910   ALKPHOS 89 12/26/2016 0910   AST 13 12/26/2016 0910   ALT 18 12/26/2016 0910   BILITOT 0.41 12/26/2016 0910         RADIOGRAPHIC STUDIES: Ct Chest W Contrast  Result Date: 12/23/2016 CLINICAL DATA:  Lung cancer with brain metastases. EXAM: CT CHEST, ABDOMEN, AND PELVIS WITH CONTRAST TECHNIQUE: Multidetector CT imaging of the chest, abdomen and pelvis was performed following the standard protocol during bolus administration of intravenous contrast. CONTRAST:  157m ISOVUE-300 IOPAMIDOL (ISOVUE-300) INJECTION 61% COMPARISON:  10/21/2016.  06/17/2016. FINDINGS: CT CHEST FINDINGS Cardiovascular: The heart size is normal. No pericardial effusion. Coronary artery calcification is noted. Mediastinum/Nodes: No mediastinal lymphadenopathy. No evidence for gross hilar lymphadenopathy although assessment is limited by the lack of intravenous contrast on today's study. The esophagus has normal imaging features. There is no axillary lymphadenopathy. Lungs/Pleura: Biapical pleural-parenchymal scarring again noted with stable appearance of the adjacent nodular lesions in the right apex. The more posterior of the 2 lesions measures 2.7 x 1.5 cm today compared to 2.6 x 1.8 cm previously. The more anterior of the 2 lesions measures 1.4 x 1.7 cm today compared to 1.1 x 1.8 cm previously. Changes of centrilobular  and paraseptal emphysema again noted. No focal airspace consolidation. No pulmonary edema or pleural effusion. Musculoskeletal: Bone windows reveal no worrisome lytic or sclerotic osseous lesions. CT ABDOMEN PELVIS FINDINGS Hepatobiliary: No focal abnormality within the liver parenchyma. There is no evidence for gallstones, gallbladder wall thickening, or pericholecystic fluid. No intrahepatic or extrahepatic biliary dilation. Pancreas: No focal mass lesion. No dilatation of the main duct. No intraparenchymal cyst. No peripancreatic edema. Spleen: No splenomegaly. No focal mass lesion. Adrenals/Urinary Tract: No adrenal nodule or mass. Kidneys are unremarkable. No evidence for hydroureter. The urinary bladder appears normal  for the degree of distention. Stomach/Bowel: Stomach is nondistended. No gastric wall thickening. No evidence of outlet obstruction. Duodenum is normally positioned as is the ligament of Treitz. No small bowel wall thickening. No small bowel dilatation. The terminal ileum is normal. Diverticular changes are noted in the left colon without evidence of diverticulitis. Surgical anastomosis noted distal sigmoid colon. Vascular/Lymphatic: There is abdominal aortic atherosclerosis without aneurysm. There is no gastrohepatic or hepatoduodenal ligament lymphadenopathy. No intraperitoneal or retroperitoneal lymphadenopathy. No pelvic sidewall lymphadenopathy. Reproductive: Uterus surgically absent.  There is no adnexal mass. Other: No intraperitoneal free fluid. Musculoskeletal: Bone windows reveal no worrisome lytic or sclerotic osseous lesions. IMPRESSION: 1. Stable exam. No new or progressive findings in the chest, abdomen, or pelvis. 2. Two areas of nodular opacity in the right lung apex are unchanged in the interval. The larger of the 2 shows no substantial change since 06/25/2016. The smaller of the 2 nodules is new since 06/17/2016 but unchanged since 10/21/2016. Given no history of interval radiation to the right apex since 06/17/2016 continued close attention to this nodule on follow-up imaging is recommended. Electronically Signed   By: Misty Stanley M.D.   On: 12/23/2016 13:29   Mr [name] GG Contrast  Result Date: 01/06/2017 CLINICAL DATA:  66 year old female with non-small cell lung cancer of the right upper lobe. Right parietal 9 mm metastasis was treated with radiation in February 2015. 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 Restaging. Subsequent encounter. EXAM: MRI HEAD WITHOUT AND WITH CONTRAST TECHNIQUE: Multiplanar, multiecho pulse sequences of the brain and surrounding structures were obtained without and with intravenous  contrast. CONTRAST:  64m MULTIHANCE GADOBENATE DIMEGLUMINE 529 MG/ML IV SOLN COMPARISON:  07/07/16 and earlier. FINDINGS: Brain: Confluent T2 and FLAIR hyperintensity in the anterolateral right parietal lobe with mild gyral expansion is stable since July 2017. Underlying 15-16 mm nodular lesion with mild hemosiderin (series 10, image 106) remains stable in size and configuration. No new abnormal intracranial enhancement. No dural thickening. No leptomeningeal enhancement. Stable gray and white matter signal elsewhere, including scattered chronic probably small vessel disease related nonspecific cerebral white matter T2 and FLAIR hyperintensity without mass effect. No restricted diffusion to suggest acute infarction. No midline shift, ventriculomegaly, extra-axial collection or acute intracranial hemorrhage. Cervicomedullary junction and pituitary are within normal limits. No other chronic cerebral blood products. No cortical encephalomalacia. Vascular: Major intracranial vascular flow voids are stable. Skull and upper cervical spine: Negative visible cervical spine and spinal cord. Visualized bone marrow signal is within normal limits. Sinuses/Orbits: Stable and negative. Other: Visible internal auditory structures appear normal. Small left posterior convexity probably proteinaceous scalp soft tissue nodule remains stable and without enhancement. IMPRESSION: Continued stable post treatment appearance of the brain. No new metastatic disease. 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 Electronically Signed   By: HHerminio HeadsD.  On: 01/06/2017 11:41   Ct Abdomen Pelvis W Contrast  Result Date: 12/23/2016 CLINICAL DATA:  Lung cancer with brain metastases. EXAM: CT CHEST, ABDOMEN, AND PELVIS WITH CONTRAST TECHNIQUE: Multidetector CT imaging of the chest, abdomen and pelvis was performed following the standard protocol during bolus administration of intravenous contrast. CONTRAST:  172m  ISOVUE-300 IOPAMIDOL (ISOVUE-300) INJECTION 61% COMPARISON:  10/21/2016.  06/17/2016. FINDINGS: CT CHEST FINDINGS Cardiovascular: The heart size is normal. No pericardial effusion. Coronary artery calcification is noted. Mediastinum/Nodes: No mediastinal lymphadenopathy. No evidence for gross hilar lymphadenopathy although assessment is limited by the lack of intravenous contrast on today's study. The esophagus has normal imaging features. There is no axillary lymphadenopathy. Lungs/Pleura: Biapical pleural-parenchymal scarring again noted with stable appearance of the adjacent nodular lesions in the right apex. The more posterior of the 2 lesions measures 2.7 x 1.5 cm today compared to 2.6 x 1.8 cm previously. The more anterior of the 2 lesions measures 1.4 x 1.7 cm today compared to 1.1 x 1.8 cm previously. Changes of centrilobular and paraseptal emphysema again noted. No focal airspace consolidation. No pulmonary edema or pleural effusion. Musculoskeletal: Bone windows reveal no worrisome lytic or sclerotic osseous lesions. CT ABDOMEN PELVIS FINDINGS Hepatobiliary: No focal abnormality within the liver parenchyma. There is no evidence for gallstones, gallbladder wall thickening, or pericholecystic fluid. No intrahepatic or extrahepatic biliary dilation. Pancreas: No focal mass lesion. No dilatation of the main duct. No intraparenchymal cyst. No peripancreatic edema. Spleen: No splenomegaly. No focal mass lesion. Adrenals/Urinary Tract: No adrenal nodule or mass. Kidneys are unremarkable. No evidence for hydroureter. The urinary bladder appears normal for the degree of distention. Stomach/Bowel: Stomach is nondistended. No gastric wall thickening. No evidence of outlet obstruction. Duodenum is normally positioned as is the ligament of Treitz. No small bowel wall thickening. No small bowel dilatation. The terminal ileum is normal. Diverticular changes are noted in the left colon without evidence of diverticulitis.  Surgical anastomosis noted distal sigmoid colon. Vascular/Lymphatic: There is abdominal aortic atherosclerosis without aneurysm. There is no gastrohepatic or hepatoduodenal ligament lymphadenopathy. No intraperitoneal or retroperitoneal lymphadenopathy. No pelvic sidewall lymphadenopathy. Reproductive: Uterus surgically absent.  There is no adnexal mass. Other: No intraperitoneal free fluid. Musculoskeletal: Bone windows reveal no worrisome lytic or sclerotic osseous lesions. IMPRESSION: 1. Stable exam. No new or progressive findings in the chest, abdomen, or pelvis. 2. Two areas of nodular opacity in the right lung apex are unchanged in the interval. The larger of the 2 shows no substantial change since 06/25/2016. The smaller of the 2 nodules is new since 06/17/2016 but unchanged since 10/21/2016. Given no history of interval radiation to the right apex since 06/17/2016 continued close attention to this nodule on follow-up imaging is recommended. Electronically Signed   By: EMisty StanleyM.D.   On: 12/23/2016 13:29    ASSESSMENT AND PLAN:  This is a very pleasant 66years old white female with metastatic non-small cell lung cancer, adenocarcinoma status post induction systemic chemotherapy with carboplatin, and Alimta with partial response. She is currently on maintenance treatment with Alimta 500 MG/M2 every 3 weeks status post 45 cycles. She continues to tolerate her treatment well. I recommended for her to proceed with cycle #46 today as a scheduled. I will see her back for follow-up visit in 3 weeks for evaluation before starting cycle #47. The patient was advised to call immediately if she has any concerning symptoms in the interval. The patient voices understanding of current disease status and treatment options  and is in agreement with the current care plan. All questions were answered. The patient knows to call the clinic with any problems, questions or concerns. We can certainly see the patient  much sooner if necessary. I spent 10 minutes counseling the patient face to face. The total time spent in the appointment was 15 minutes.  Disclaimer: This note was dictated with voice recognition software. Similar sounding words can inadvertently be transcribed and may not be corrected upon review.

## 2017-01-17 ENCOUNTER — Other Ambulatory Visit: Payer: Self-pay | Admitting: Medical Oncology

## 2017-01-17 DIAGNOSIS — C3490 Malignant neoplasm of unspecified part of unspecified bronchus or lung: Secondary | ICD-10-CM

## 2017-01-17 MED ORDER — FOLIC ACID 1 MG PO TABS: 1.0000 mg | ORAL_TABLET | Freq: Every day | ORAL | 0 refills | Status: DC

## 2017-02-06 ENCOUNTER — Telehealth: Payer: Self-pay | Admitting: Internal Medicine

## 2017-02-06 ENCOUNTER — Other Ambulatory Visit (HOSPITAL_BASED_OUTPATIENT_CLINIC_OR_DEPARTMENT_OTHER): Payer: Medicare Other

## 2017-02-06 ENCOUNTER — Encounter: Payer: Self-pay | Admitting: Internal Medicine

## 2017-02-06 ENCOUNTER — Ambulatory Visit (HOSPITAL_BASED_OUTPATIENT_CLINIC_OR_DEPARTMENT_OTHER): Payer: Medicare Other | Admitting: Internal Medicine

## 2017-02-06 ENCOUNTER — Ambulatory Visit (HOSPITAL_BASED_OUTPATIENT_CLINIC_OR_DEPARTMENT_OTHER): Payer: Medicare Other

## 2017-02-06 DIAGNOSIS — C7931 Secondary malignant neoplasm of brain: Secondary | ICD-10-CM | POA: Diagnosis not present

## 2017-02-06 DIAGNOSIS — C3411 Malignant neoplasm of upper lobe, right bronchus or lung: Secondary | ICD-10-CM

## 2017-02-06 DIAGNOSIS — F419 Anxiety disorder, unspecified: Secondary | ICD-10-CM

## 2017-02-06 DIAGNOSIS — Z5111 Encounter for antineoplastic chemotherapy: Secondary | ICD-10-CM

## 2017-02-06 DIAGNOSIS — F411 Generalized anxiety disorder: Secondary | ICD-10-CM

## 2017-02-06 DIAGNOSIS — C7949 Secondary malignant neoplasm of other parts of nervous system: Secondary | ICD-10-CM

## 2017-02-06 LAB — COMPREHENSIVE METABOLIC PANEL
ALT: 12 U/L (ref 0–55)
AST: 12 U/L (ref 5–34)
Albumin: 3.6 g/dL (ref 3.5–5.0)
Alkaline Phosphatase: 92 U/L (ref 40–150)
Anion Gap: 11 mEq/L (ref 3–11)
BUN: 16.1 mg/dL (ref 7.0–26.0)
CO2: 25 mEq/L (ref 22–29)
Calcium: 10.2 mg/dL (ref 8.4–10.4)
Chloride: 106 mEq/L (ref 98–109)
Creatinine: 0.8 mg/dL (ref 0.6–1.1)
EGFR: 77 mL/min/{1.73_m2} — ABNORMAL LOW (ref >90)
Glucose: 96 mg/dl (ref 70–140)
Potassium: 4 mEq/L (ref 3.5–5.1)
Sodium: 142 mEq/L (ref 136–145)
Total Bilirubin: 0.34 mg/dL (ref 0.20–1.20)
Total Protein: 7.1 g/dL (ref 6.4–8.3)

## 2017-02-06 LAB — CBC WITH DIFFERENTIAL/PLATELET
BASO%: 0.2 % (ref 0.0–2.0)
Basophils Absolute: 0 10*3/uL (ref 0.0–0.1)
EOS%: 0 % (ref 0.0–7.0)
Eosinophils Absolute: 0 10*3/uL (ref 0.0–0.5)
HCT: 36.8 % (ref 34.8–46.6)
HGB: 12.5 g/dL (ref 11.6–15.9)
LYMPH%: 3.9 % — ABNORMAL LOW (ref 14.0–49.7)
MCH: 33.3 pg (ref 25.1–34.0)
MCHC: 34 g/dL (ref 31.5–36.0)
MCV: 98 fL (ref 79.5–101.0)
MONO#: 1.1 10*3/uL — ABNORMAL HIGH (ref 0.1–0.9)
MONO%: 10.2 % (ref 0.0–14.0)
NEUT#: 9 10*3/uL — ABNORMAL HIGH (ref 1.5–6.5)
NEUT%: 85.7 % — ABNORMAL HIGH (ref 38.4–76.8)
Platelets: 287 10*3/uL (ref 145–400)
RBC: 3.75 10*6/uL (ref 3.70–5.45)
RDW: 14.7 % — ABNORMAL HIGH (ref 11.2–14.5)
WBC: 10.4 10*3/uL — ABNORMAL HIGH (ref 3.9–10.3)
lymph#: 0.4 10*3/uL — ABNORMAL LOW (ref 0.9–3.3)

## 2017-02-06 MED ORDER — SODIUM CHLORIDE 0.9 % IV SOLN
500.0000 mg/m2 | Freq: Once | INTRAVENOUS | Status: AC
  Administered 2017-02-06: 900 mg via INTRAVENOUS
  Filled 2017-02-06: qty 20

## 2017-02-06 MED ORDER — ALPRAZOLAM 0.25 MG PO TABS: 0.2500 mg | ORAL_TABLET | Freq: Three times a day (TID) | ORAL | 0 refills | Status: DC | PRN

## 2017-02-06 MED ORDER — ONDANSETRON HCL 8 MG PO TABS
ORAL_TABLET | ORAL | Status: AC
  Filled 2017-02-06: qty 1

## 2017-02-06 MED ORDER — DEXAMETHASONE SODIUM PHOSPHATE 10 MG/ML IJ SOLN
INTRAMUSCULAR | Status: AC
  Filled 2017-02-06: qty 1

## 2017-02-06 MED ORDER — SODIUM CHLORIDE 0.9 % IV SOLN
Freq: Once | INTRAVENOUS | Status: AC
  Administered 2017-02-06: 10:00:00 via INTRAVENOUS

## 2017-02-06 MED ORDER — ONDANSETRON HCL 8 MG PO TABS
8.0000 mg | ORAL_TABLET | Freq: Once | ORAL | Status: AC
  Administered 2017-02-06: 8 mg via ORAL

## 2017-02-06 MED ORDER — DEXAMETHASONE SODIUM PHOSPHATE 10 MG/ML IJ SOLN
10.0000 mg | Freq: Once | INTRAMUSCULAR | Status: AC
  Administered 2017-02-06: 10 mg via INTRAVENOUS

## 2017-02-06 NOTE — Telephone Encounter (Signed)
Gave patient avs report and appointments for June thru September

## 2017-02-06 NOTE — Patient Instructions (Signed)
[facility] Discharge Instructions for Patients Receiving Chemotherapy  Today you received the following chemotherapy agents Alimta  To help prevent nausea and vomiting after your treatment, we encourage you to take your nausea medication as needed   If you develop nausea and vomiting that is not controlled by your nausea medication, call the clinic.   BELOW ARE SYMPTOMS THAT SHOULD BE REPORTED IMMEDIATELY:  *FEVER GREATER THAN 100.5 F  *CHILLS WITH OR WITHOUT FEVER  NAUSEA AND VOMITING THAT IS NOT CONTROLLED WITH YOUR NAUSEA MEDICATION  *UNUSUAL SHORTNESS OF BREATH  *UNUSUAL BRUISING OR BLEEDING  TENDERNESS IN MOUTH AND THROAT WITH OR WITHOUT PRESENCE OF ULCERS  *URINARY PROBLEMS  *BOWEL PROBLEMS  UNUSUAL RASH Items with * indicate a potential emergency and should be followed up as soon as possible.  Feel free to call the clinic you have any questions or concerns. The clinic phone number is [phone].  Please show the [facility] at check-in to the Emergency Department and triage nurse.

## 2017-02-06 NOTE — Progress Notes (Signed)
[facility] [phone]   [phone]  OFFICE PROGRESS NOTE  [name], MD [address] [place] [address]  DIAGNOSIS: Stage IV (T2a, N0, M1b) non-small cell lung cancer, adenocarcinoma with negative EGFR and ALK mutations diagnosed in January 2015 and presented with right upper lobe lung mass in addition to a solitary brain metastasis.  PRIOR THERAPY: 1) Status post stereotactic radiotherapy to a solitary right parietal brain lesion under the care of Dr. [name] on 10/16/2013. 2) Status post palliative radiotherapy to the right lung tumor under the care of Dr. [name] completed on 12/05/2013. 3) Systemic chemotherapy with carboplatin for AUC of 5 and Alimta 500 mg/M2 every 3 weeks. First dose Jan 06 2014. Status post 6 cycles.  CURRENT THERAPY: Systemic chemotherapy with maintenance Alimta 500 MG/M2 every 3 weeks, status post 46 cycles.  INTERVAL HISTORY: Alexis [name] 66 y.o. female returns to the clinic today for follow-up visit accompanied by her husband. The patient continues to tolerate her systemic chemotherapy with single agent Alimta fairly well. She denied having any nausea or vomiting, no fever or chills. She has no chest pain, shortness of breath, cough or hemoptysis. She was recently diagnosed with cataract and expected to have cataract surgery in the next few weeks. She denied having any weight loss or night sweats. She is here today for evaluation before starting cycle #47.  MEDICAL HISTORY: Past Medical History:  Diagnosis Date  . Anxiety   . Anxiety 06/20/2016  . Cervical cancer ([facility])   . Encounter for antineoplastic chemotherapy 07/20/2015  . 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  MEDICATIONS:  Current Outpatient Prescriptions  Medication Sig Dispense Refill  . acetaminophen (TYLENOL) 500 MG tablet Take 500  mg by mouth every 6 (six) hours as needed for mild pain or headache. Reported on 11/02/2015    . ALPRAZolam (XANAX) 0.25 MG tablet Take 1 tablet (0.25 mg total) by mouth 3 (three) times daily as needed. for anxiety 45 tablet 0  . Ascorbic Acid (VITAMIN C GUMMIE PO) Take 1 each by mouth every morning.    Marland Kitchen dexamethasone (DECADRON) 4 MG tablet TAKE 1 TABLET BY MOUTH TWICE A DAY THE DAY BEFORE, DAY OF AND DAY AFTER THE CHEMOTHERAPY EVERY 3 WKS 40 tablet 1  . folic acid (FOLVITE) 1 MG tablet Take 1 tablet (1 mg total) by mouth daily. 90 tablet 0  . Multiple Vitamin (MULTIVITAMIN WITH MINERALS) TABS tablet Take 1 tablet by mouth every morning.    Marland Kitchen omeprazole (PRILOSEC) 20 MG capsule Take 1 capsule (20 mg total) by mouth daily. As needed for reflux or indigestion. 42 capsule PRN  . ondansetron (ZOFRAN) 8 MG tablet Take 1 tablet (8 mg total) by mouth every 8 (eight) hours as needed for nausea or vomiting. (Patient not taking: Reported on 01/16/2017) 30 tablet 0  . OVER THE COUNTER MEDICATION Take 1 tablet by mouth every morning. (Vitamin A)    . pentoxifylline (TRENTAL) 400 MG CR tablet TAKE ONE TABLET BY MOUTH TWICE DAILY, ALSO  TAKE  VITAMIN  E  400  UNITS  TWICE  DAILY  WITH  PENTOXIFYLLINE 60 tablet 0  . prochlorperazine (COMPAZINE) 10 MG tablet Take 1 tablet (10 mg total) by mouth every 6 (six) hours as needed for nausea or vomiting. (Patient not taking:  Reported on 01/09/2017) 60 tablet 0  . senna-docusate (SENOKOT-S) 8.6-50 MG tablet Take 1 tablet by mouth daily. 30 tablet prn   No current facility-administered medications for this visit.     SURGICAL HISTORY:  Past Surgical History:  Procedure Laterality Date  . ABDOMINAL HYSTERECTOMY    . COLOSTOMY TAKEDOWN N/A 07/10/2014   Procedure: LAPAROSCOPIC LYSIS OF ADHESIONS (90 MIN) LAPAROSCOPIC ASSISTED COLOSTOMY CLOSURE, RIGID PROCTOSIGMOIDOSCOPY;  Surgeon: Avel Peace, MD;  Location: [facility];  Service: General;  Laterality: N/A;  . LAPAROTOMY N/A  11/03/2013   Procedure: EXPLORATORY LAPAROTOMY, DRAINAGE OF INTRA  ABDOMINAL ABSCESSES, MOBILIZATION OF SPLENIC FLEXURE, SIGMOID COLECTOMY WITH COLOSTOMY;  Surgeon: Adolph Pollack, MD;  Location: [facility];  Service: General;  Laterality: N/A;  . VIDEO BRONCHOSCOPY Bilateral 08/30/2013   Procedure: VIDEO BRONCHOSCOPY WITH FLUORO;  Surgeon: Nyoka Cowden, MD;  Location: [facility];  Service: Cardiopulmonary;  Laterality: Bilateral;    REVIEW OF SYSTEMS:  A comprehensive review of systems was negative.   PHYSICAL EXAMINATION: General appearance: alert, cooperative and no distress Head: Normocephalic, without obvious abnormality, atraumatic Neck: no adenopathy, no JVD, supple, symmetrical, trachea midline and thyroid not enlarged, symmetric, no tenderness/mass/nodules Lymph nodes: Cervical, supraclavicular, and axillary nodes normal. Resp: clear to auscultation bilaterally Back: symmetric, no curvature. ROM normal. No CVA tenderness. Cardio: regular rate and rhythm, S1, S2 normal, no murmur, click, rub or gallop GI: soft, non-tender; bowel sounds normal; no masses,  no organomegaly Extremities: extremities normal, atraumatic, no cyanosis or edema  ECOG PERFORMANCE STATUS: 0 - Asymptomatic  Blood pressure (!) 113/56, pulse 76, temperature 98 F (36.7 C), temperature source Oral, resp. rate 18, height 5\' 4"  (1.626 m), weight 166 lb 1.6 oz (75.3 kg), SpO2 100 %.  LABORATORY DATA: Lab Results  Component Value Date   WBC 10.4 (H) 02/06/2017   HGB 12.5 02/06/2017   HCT 36.8 02/06/2017   MCV 98.0 02/06/2017   PLT 287 02/06/2017      Chemistry      Component Value Date/Time   NA 143 01/16/2017 0849   K 4.0 01/16/2017 0849   CL 100 07/12/2014 0512   CO2 26 01/16/2017 0849   BUN 15.9 01/16/2017 0849   CREATININE 0.8 01/16/2017 0849      Component Value Date/Time   CALCIUM 9.5 01/16/2017 0849   ALKPHOS 93 01/16/2017 0849   AST 12 01/16/2017 0849   ALT 14 01/16/2017 0849   BILITOT  0.39 01/16/2017 0849       RADIOGRAPHIC STUDIES: No results found.  ASSESSMENT AND PLAN:  This is a very pleasant 66 years old white female with metastatic non-small cell lung cancer, adenocarcinoma. She was treated with induction systemic chemotherapy with carboplatin and Alimta with partial response. This was followed by maintenance treatment with single agent Alimta status post 46 cycles and has been tolerating the treatment well. I recommended for the patient to proceed with cycle #47 today as scheduled. I will see her back for follow-up visit in 3 weeks for evaluation before starting cycle #48.  For anxiety, I gave her refill of Xanax. The patient was advised to call immediately if she has any concerning symptoms in the interval. The patient voices understanding of current disease status and treatment options and is in agreement with the current care plan. All questions were answered. The patient knows to call the clinic with any problems, questions or concerns. We can certainly see the patient much sooner if necessary. I spent 10 minutes counseling the patient  face to face. The total time spent in the appointment was 15 minutes.  Disclaimer: This note was dictated with voice recognition software. Similar sounding words can inadvertently be transcribed and may not be corrected upon review.

## 2017-02-13 DIAGNOSIS — H25813 Combined forms of age-related cataract, bilateral: Secondary | ICD-10-CM | POA: Diagnosis not present

## 2017-02-27 ENCOUNTER — Telehealth: Payer: Self-pay | Admitting: Internal Medicine

## 2017-02-27 ENCOUNTER — Ambulatory Visit (HOSPITAL_BASED_OUTPATIENT_CLINIC_OR_DEPARTMENT_OTHER): Payer: Medicare Other

## 2017-02-27 ENCOUNTER — Other Ambulatory Visit (HOSPITAL_BASED_OUTPATIENT_CLINIC_OR_DEPARTMENT_OTHER): Payer: Medicare Other

## 2017-02-27 ENCOUNTER — Ambulatory Visit (HOSPITAL_BASED_OUTPATIENT_CLINIC_OR_DEPARTMENT_OTHER): Payer: Medicare Other | Admitting: Internal Medicine

## 2017-02-27 ENCOUNTER — Encounter: Payer: Self-pay | Admitting: Internal Medicine

## 2017-02-27 VITALS — BP 126/56 | HR 88 | Temp 98.5°F | Resp 17 | Ht 64.0 in | Wt 164.5 lb

## 2017-02-27 DIAGNOSIS — Z5111 Encounter for antineoplastic chemotherapy: Secondary | ICD-10-CM | POA: Diagnosis present

## 2017-02-27 DIAGNOSIS — C3411 Malignant neoplasm of upper lobe, right bronchus or lung: Secondary | ICD-10-CM

## 2017-02-27 DIAGNOSIS — C7949 Secondary malignant neoplasm of other parts of nervous system: Principal | ICD-10-CM

## 2017-02-27 DIAGNOSIS — C7931 Secondary malignant neoplasm of brain: Secondary | ICD-10-CM | POA: Diagnosis not present

## 2017-02-27 LAB — CBC WITH DIFFERENTIAL/PLATELET
BASO%: 0 % (ref 0.0–2.0)
Basophils Absolute: 0 10*3/uL (ref 0.0–0.1)
EOS%: 0 % (ref 0.0–7.0)
Eosinophils Absolute: 0 10*3/uL (ref 0.0–0.5)
HCT: 38.7 % (ref 34.8–46.6)
HGB: 12.5 g/dL (ref 11.6–15.9)
LYMPH%: 5.4 % — ABNORMAL LOW (ref 14.0–49.7)
MCH: 33.1 pg (ref 25.1–34.0)
MCHC: 32.3 g/dL (ref 31.5–36.0)
MCV: 102.4 fL — ABNORMAL HIGH (ref 79.5–101.0)
MONO#: 1.3 10*3/uL — ABNORMAL HIGH (ref 0.1–0.9)
MONO%: 12.9 % (ref 0.0–14.0)
NEUT#: 8.2 10*3/uL — ABNORMAL HIGH (ref 1.5–6.5)
NEUT%: 81.7 % — ABNORMAL HIGH (ref 38.4–76.8)
Platelets: 263 10*3/uL (ref 145–400)
RBC: 3.78 10*6/uL (ref 3.70–5.45)
RDW: 14.6 % — ABNORMAL HIGH (ref 11.2–14.5)
WBC: 10.1 10*3/uL (ref 3.9–10.3)
lymph#: 0.5 10*3/uL — ABNORMAL LOW (ref 0.9–3.3)

## 2017-02-27 LAB — COMPREHENSIVE METABOLIC PANEL
ALT: 11 U/L (ref 0–55)
AST: 12 U/L (ref 5–34)
Albumin: 3.6 g/dL (ref 3.5–5.0)
Alkaline Phosphatase: 86 U/L (ref 40–150)
Anion Gap: 10 mEq/L (ref 3–11)
BUN: 14.7 mg/dL (ref 7.0–26.0)
CO2: 25 mEq/L (ref 22–29)
Calcium: 10.2 mg/dL (ref 8.4–10.4)
Chloride: 106 mEq/L (ref 98–109)
Creatinine: 0.8 mg/dL (ref 0.6–1.1)
EGFR: 76 mL/min/{1.73_m2} — ABNORMAL LOW (ref >90)
Glucose: 112 mg/dl (ref 70–140)
Potassium: 4.6 mEq/L (ref 3.5–5.1)
Sodium: 141 mEq/L (ref 136–145)
Total Bilirubin: 0.41 mg/dL (ref 0.20–1.20)
Total Protein: 7 g/dL (ref 6.4–8.3)

## 2017-02-27 MED ORDER — DEXAMETHASONE SODIUM PHOSPHATE 10 MG/ML IJ SOLN
10.0000 mg | Freq: Once | INTRAMUSCULAR | Status: AC
  Administered 2017-02-27: 10 mg via INTRAVENOUS

## 2017-02-27 MED ORDER — CYANOCOBALAMIN 1000 MCG/ML IJ SOLN
INTRAMUSCULAR | Status: AC
  Filled 2017-02-27: qty 1

## 2017-02-27 MED ORDER — ONDANSETRON HCL 8 MG PO TABS
ORAL_TABLET | ORAL | Status: AC
  Filled 2017-02-27: qty 1

## 2017-02-27 MED ORDER — CYANOCOBALAMIN 1000 MCG/ML IJ SOLN
1000.0000 ug | Freq: Once | INTRAMUSCULAR | Status: AC
  Administered 2017-02-27: 1000 ug via INTRAMUSCULAR

## 2017-02-27 MED ORDER — SODIUM CHLORIDE 0.9 % IV SOLN
500.0000 mg/m2 | Freq: Once | INTRAVENOUS | Status: AC
  Administered 2017-02-27: 900 mg via INTRAVENOUS
  Filled 2017-02-27: qty 20

## 2017-02-27 MED ORDER — ONDANSETRON HCL 8 MG PO TABS
8.0000 mg | ORAL_TABLET | Freq: Once | ORAL | Status: AC
  Administered 2017-02-27: 8 mg via ORAL

## 2017-02-27 MED ORDER — DEXAMETHASONE SODIUM PHOSPHATE 10 MG/ML IJ SOLN
INTRAMUSCULAR | Status: AC
  Filled 2017-02-27: qty 1

## 2017-02-27 MED ORDER — SODIUM CHLORIDE 0.9 % IV SOLN
Freq: Once | INTRAVENOUS | Status: AC
  Administered 2017-02-27: 10:00:00 via INTRAVENOUS

## 2017-02-27 NOTE — Progress Notes (Signed)
Alexis [name]   [phone]  OFFICE PROGRESS NOTE  Alexis Roers, MD [address] [place] [address]  DIAGNOSIS: Stage IV (T2a, N0, M1b) non-small cell lung cancer, adenocarcinoma with negative EGFR and ALK mutations diagnosed in January 2015 and presented with right upper lobe lung mass in addition to a solitary brain metastasis.  PRIOR THERAPY: 1) Status post stereotactic radiotherapy to a solitary right parietal brain lesion under the care of Dr. Lisbeth [name] on 10/16/2013. 2) Status post palliative radiotherapy to the right lung tumor under the care of Dr. Lisbeth [name] completed on 12/05/2013. 3) Systemic chemotherapy with carboplatin for AUC of 5 and Alimta 500 mg/M2 every 3 weeks. First dose Jan 06 2014. Status post 6 cycles.  CURRENT THERAPY: Systemic chemotherapy with maintenance Alimta 500 MG/M2 every 3 weeks, status post 47 cycles.  INTERVAL HISTORY: Alexis [name] 65 y.o. female returns to the clinic today for follow-up visit accompanied by her husband. The patient is feeling fine today with no specific complaints. She denied having any chest pain, shortness breath, cough or hemoptysis. She denied having any fever or chills. She has no nausea, vomiting, diarrhea or constipation. She continues to tolerate her treatment with maintenance Alimta fairly well. She is scheduled for cataract surgery on July 5 and 03/16/2017. The patient is here today for evaluation before starting cycle #48.  MEDICAL HISTORY: Past Medical History:  Diagnosis Date  . Anxiety   . Anxiety 06/20/2016  . Cervical cancer ([place])   . Encounter for antineoplastic chemotherapy 07/20/2015  . 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  MEDICATIONS:  Current Outpatient Prescriptions  Medication Sig Dispense Refill  . acetaminophen (TYLENOL) 500 MG tablet Take 500  mg by mouth every 6 (six) hours as needed for mild pain or headache. Reported on 11/02/2015    . ALPRAZolam (XANAX) 0.25 MG tablet Take 1 tablet (0.25 mg total) by mouth 3 (three) times daily as needed. for anxiety 45 tablet 0  . Ascorbic Acid (VITAMIN C GUMMIE PO) Take 1 each by mouth every morning.    Marland Kitchen dexamethasone (DECADRON) 4 MG tablet TAKE 1 TABLET BY MOUTH TWICE A DAY THE DAY BEFORE, DAY OF AND DAY AFTER THE CHEMOTHERAPY EVERY 3 WKS 40 tablet 1  . folic acid (FOLVITE) 1 MG tablet Take 1 tablet (1 mg total) by mouth daily. 90 tablet 0  . Multiple Vitamin (MULTIVITAMIN WITH MINERALS) TABS tablet Take 1 tablet by mouth every morning.    Marland Kitchen omeprazole (PRILOSEC) 20 MG capsule Take 1 capsule (20 mg total) by mouth daily. As needed for reflux or indigestion. 42 capsule PRN  . ondansetron (ZOFRAN) 8 MG tablet Take 1 tablet (8 mg total) by mouth every 8 (eight) hours as needed for nausea or vomiting. 30 tablet 0  . OVER THE COUNTER MEDICATION Take 1 tablet by mouth every morning. (Vitamin A)    . pentoxifylline (TRENTAL) 400 MG CR tablet TAKE ONE TABLET BY MOUTH TWICE DAILY, ALSO  TAKE  VITAMIN  E  400  UNITS  TWICE  DAILY  WITH  PENTOXIFYLLINE 60 tablet 0  . prochlorperazine (COMPAZINE) 10 MG tablet Take 1 tablet (10 mg total) by mouth every 6 (six) hours as needed for nausea or vomiting. 60 tablet 0  . senna-docusate (SENOKOT-S) 8.6-50 MG tablet Take 1  tablet by mouth daily. 30 tablet prn   No current facility-administered medications for this visit.     SURGICAL HISTORY:  Past Surgical History:  Procedure Laterality Date  . ABDOMINAL HYSTERECTOMY    . COLOSTOMY TAKEDOWN N/A 07/10/2014   Procedure: LAPAROSCOPIC LYSIS OF ADHESIONS (90 MIN) LAPAROSCOPIC ASSISTED COLOSTOMY CLOSURE, RIGID PROCTOSIGMOIDOSCOPY;  Surgeon: Alexis Confer, MD;  Location: [facility];  Service: General;  Laterality: N/A;  . LAPAROTOMY N/A 11/03/2013   Procedure: EXPLORATORY LAPAROTOMY, DRAINAGE OF INTRA  ABDOMINAL ABSCESSES,  MOBILIZATION OF SPLENIC FLEXURE, SIGMOID COLECTOMY WITH COLOSTOMY;  Surgeon: Alexis Hollingshead, MD;  Location: [facility];  Service: General;  Laterality: N/A;  . VIDEO BRONCHOSCOPY Bilateral 08/30/2013   Procedure: VIDEO BRONCHOSCOPY WITH FLUORO;  Surgeon: Alexis Rockers, MD;  Location: [facility];  Service: Cardiopulmonary;  Laterality: Bilateral;    REVIEW OF SYSTEMS:  A comprehensive review of systems was negative.   PHYSICAL EXAMINATION: General appearance: alert, cooperative and no distress Head: Normocephalic, without obvious abnormality, atraumatic Neck: no adenopathy, no JVD, supple, symmetrical, trachea midline and thyroid not enlarged, symmetric, no tenderness/mass/nodules Lymph nodes: Cervical, supraclavicular, and axillary nodes normal. Resp: clear to auscultation bilaterally Back: symmetric, no curvature. ROM normal. No CVA tenderness. Cardio: regular rate and rhythm, S1, S2 normal, no murmur, click, rub or gallop GI: soft, non-tender; bowel sounds normal; no masses,  no organomegaly Extremities: extremities normal, atraumatic, no cyanosis or edema  ECOG PERFORMANCE STATUS: 0 - Asymptomatic  Blood pressure (!) 126/56, pulse 88, temperature 98.5 F (36.9 C), temperature source Oral, resp. rate 17, height _0  (1.626 m), weight 164 lb 8 oz (74.6 kg), SpO2 100 %.  LABORATORY DATA: Lab Results  Component Value Date   WBC 10.1 02/27/2017   HGB 12.5 02/27/2017   HCT 38.7 02/27/2017   MCV 102.4 (H) 02/27/2017   PLT 263 02/27/2017      Chemistry      Component Value Date/Time   NA 142 02/06/2017 0856   K 4.0 02/06/2017 0856   CL 100 07/12/2014 0512   CO2 25 02/06/2017 0856   BUN 16.1 02/06/2017 0856   CREATININE 0.8 02/06/2017 0856      Component Value Date/Time   CALCIUM 10.2 02/06/2017 0856   ALKPHOS 92 02/06/2017 0856   AST 12 02/06/2017 0856   ALT 12 02/06/2017 0856   BILITOT 0.34 02/06/2017 0856       RADIOGRAPHIC STUDIES: No results found.  ASSESSMENT  AND PLAN:  This is a very pleasant 66 years old white female with metastatic non-small cell lung cancer, adenocarcinoma status post induction systemic chemotherapy with carboplatin and Alimta with partial response. She is currently on maintenance treatment with single agent Alimta status post 47 cycles and has been tolerating this treatment well. I recommended for the patient to proceed with cycle #48 today as a scheduled. I will see her back for follow-up visit in 3 weeks for evaluation after repeating CT scan of the chest, abdomen and pelvis for restaging of her disease. The patient was advised to call immediately if she has any concerning symptoms in the interval. The patient voices understanding of current disease status and treatment options and is in agreement with the current care plan. All questions were answered. The patient knows to call the clinic with any problems, questions or concerns. We can certainly see the patient much sooner if necessary. I spent 10 minutes counseling the patient face to face. The total time spent in the appointment was 15 minutes.  Disclaimer: This note was dictated with voice recognition software. Similar sounding words can inadvertently be transcribed and may not be corrected upon review.

## 2017-02-27 NOTE — Telephone Encounter (Signed)
No additional appts added - appts already scheduled (3 cycles ) gave patient AVS and calender per los.

## 2017-02-27 NOTE — Patient Instructions (Signed)
[facility] Discharge Instructions for Patients Receiving Chemotherapy  Today you received the following chemotherapy agents [place].  To help prevent nausea and vomiting after your treatment, we encourage you to take your nausea medication as prescribed.   If you develop nausea and vomiting that is not controlled by your nausea medication, call the clinic.   BELOW ARE SYMPTOMS THAT SHOULD BE REPORTED IMMEDIATELY:  *FEVER GREATER THAN 100.5 F  *CHILLS WITH OR WITHOUT FEVER  NAUSEA AND VOMITING THAT IS NOT CONTROLLED WITH YOUR NAUSEA MEDICATION  *UNUSUAL SHORTNESS OF BREATH  *UNUSUAL BRUISING OR BLEEDING  TENDERNESS IN MOUTH AND THROAT WITH OR WITHOUT PRESENCE OF ULCERS  *URINARY PROBLEMS  *BOWEL PROBLEMS  UNUSUAL RASH Items with * indicate a potential emergency and should be followed up as soon as possible.  Feel free to call the clinic you have any questions or concerns. The clinic phone number is [phone].  Please show the [facility] at check-in to the Emergency Department and triage nurse.   Pemetrexed injection What is this medicine? PEMETREXED (PEM e TREX ed) is a chemotherapy drug used to treat lung cancers like non-small cell lung cancer and mesothelioma. It may also be used to treat other cancers. This medicine may be used for other purposes; ask your health care provider or pharmacist if you have questions. COMMON BRAND NAME(S): Alimta What should I tell my health care provider before I take this medicine? They need to know if you have any of these conditions: -infection (especially a virus infection such as chickenpox, cold sores, or herpes) -kidney disease -low blood counts, like low white cell, platelet, or red cell counts -lung or breathing disease, like asthma -radiation therapy -an unusual or allergic reaction to pemetrexed, other medicines, foods, dyes, or preservative -pregnant or trying to get pregnant -breast-feeding How  should I use this medicine? This drug is given as an infusion into a vein. It is administered in a hospital or clinic by a specially trained health care professional. Talk to your pediatrician regarding the use of this medicine in children. Special care may be needed. Overdosage: If you think you have taken too much of this medicine contact a poison control center or emergency room at once. NOTE: This medicine is only for you. Do not share this medicine with others. What if I miss a dose? It is important not to miss your dose. Call your doctor or health care professional if you are unable to keep an appointment. What may interact with this medicine? This medicine may interact with the following medications: -Ibuprofen This list may not describe all possible interactions. Give your health care provider a list of all the medicines, herbs, non-prescription drugs, or dietary supplements you use. Also tell them if you smoke, drink alcohol, or use illegal drugs. Some items may interact with your medicine. What should I watch for while using this medicine? Visit your doctor for checks on your progress. This drug may make you feel generally unwell. This is not uncommon, as chemotherapy can affect healthy cells as well as cancer cells. Report any side effects. Continue your course of treatment even though you feel ill unless your doctor tells you to stop. In some cases, you may be given additional medicines to help with side effects. Follow all directions for their use. Call your doctor or health care professional for advice if you get a fever, chills or sore throat, or other symptoms of a cold or flu. Do not treat yourself.  This drug decreases your body's ability to fight infections. Try to avoid being around people who are sick. This medicine may increase your risk to bruise or bleed. Call your doctor or health care professional if you notice any unusual bleeding. Be careful brushing and flossing your teeth  or using a toothpick because you may get an infection or bleed more easily. If you have any dental work done, tell your dentist you are receiving this medicine. Avoid taking products that contain aspirin, acetaminophen, ibuprofen, naproxen, or ketoprofen unless instructed by your doctor. These medicines may hide a fever. Call your doctor or health care professional if you get diarrhea or mouth sores. Do not treat yourself. To protect your kidneys, drink water or other fluids as directed while you are taking this medicine. Do not become pregnant while taking this medicine or for 6 months after stopping it. Women should inform their doctor if they wish to become pregnant or think they might be pregnant. Men should not father a child while taking this medicine and for 3 months after stopping it. This may interfere with the ability to father a child. You should talk to your doctor or health care professional if you are concerned about your fertility. There is a potential for serious side effects to an unborn child. Talk to your health care professional or pharmacist for more information. Do not breast-feed an infant while taking this medicine or for 1 week after stopping it. What side effects may I notice from receiving this medicine? Side effects that you should report to your doctor or health care professional as soon as possible: -allergic reactions like skin rash, itching or hives, swelling of the face, lips, or tongue -breathing problems -redness, blistering, peeling or loosening of the skin, including inside the mouth -signs and symptoms of bleeding such as bloody or black, tarry stools; red or dark-brown urine; spitting up blood or brown material that looks like coffee grounds; red spots on the skin; unusual bruising or bleeding from the eye, gums, or nose -signs and symptoms of infection like fever or chills; cough; sore throat; pain or trouble passing urine -signs and symptoms of kidney injury like  trouble passing urine or change in the amount of urine -signs and symptoms of liver injury like dark yellow or brown urine; general ill feeling or flu-like symptoms; light-colored stools; loss of appetite; nausea; right upper belly pain; unusually weak or tired; yellowing of the eyes or skin Side effects that usually do not require medical attention (report to your doctor or health care professional if they continue or are bothersome): -constipation -dizziness -mouth sores -nausea, vomiting -pain, tingling, numbness in the hands or feet -unusually weak or tired This list may not describe all possible side effects. Call your doctor for medical advice about side effects. You may report side effects to FDA at 1-800-FDA-1088. Where should I keep my medicine? This drug is given in a hospital or clinic and will not be stored at home. NOTE: This sheet is a summary. It may not cover all possible information. If you have questions about this medicine, talk to your doctor, pharmacist, or health care provider.  2018 [employer]vier/Gold Standard (2016-06-14 18:51:46)

## 2017-03-02 DIAGNOSIS — H268 Other specified cataract: Secondary | ICD-10-CM | POA: Diagnosis not present

## 2017-03-02 DIAGNOSIS — H2511 Age-related nuclear cataract, right eye: Secondary | ICD-10-CM | POA: Diagnosis not present

## 2017-03-02 DIAGNOSIS — H5703 Miosis: Secondary | ICD-10-CM | POA: Diagnosis not present

## 2017-03-12 DIAGNOSIS — H2512 Age-related nuclear cataract, left eye: Secondary | ICD-10-CM | POA: Diagnosis not present

## 2017-03-15 ENCOUNTER — Ambulatory Visit (HOSPITAL_COMMUNITY)
Admission: RE | Admit: 2017-03-15 | Discharge: 2017-03-15 | Disposition: A | Discharge status: Home / Self Care | Payer: Medicare Other | Source: Ambulatory Visit | Attending: Internal Medicine | Admitting: Internal Medicine

## 2017-03-15 ENCOUNTER — Encounter (HOSPITAL_COMMUNITY): Payer: Self-pay

## 2017-03-15 DIAGNOSIS — C7949 Secondary malignant neoplasm of other parts of nervous system: Secondary | ICD-10-CM | POA: Diagnosis not present

## 2017-03-15 DIAGNOSIS — Z5111 Encounter for antineoplastic chemotherapy: Secondary | ICD-10-CM

## 2017-03-15 DIAGNOSIS — I700 Atherosclerosis of aorta: Secondary | ICD-10-CM | POA: Insufficient documentation

## 2017-03-15 DIAGNOSIS — M47814 Spondylosis without myelopathy or radiculopathy, thoracic region: Secondary | ICD-10-CM | POA: Insufficient documentation

## 2017-03-15 DIAGNOSIS — M47816 Spondylosis without myelopathy or radiculopathy, lumbar region: Secondary | ICD-10-CM | POA: Insufficient documentation

## 2017-03-15 DIAGNOSIS — C3411 Malignant neoplasm of upper lobe, right bronchus or lung: Secondary | ICD-10-CM

## 2017-03-15 DIAGNOSIS — Z90710 Acquired absence of both cervix and uterus: Secondary | ICD-10-CM | POA: Diagnosis not present

## 2017-03-15 DIAGNOSIS — C7931 Secondary malignant neoplasm of brain: Secondary | ICD-10-CM | POA: Insufficient documentation

## 2017-03-15 DIAGNOSIS — Z9049 Acquired absence of other specified parts of digestive tract: Secondary | ICD-10-CM | POA: Insufficient documentation

## 2017-03-15 MED ORDER — IOPAMIDOL (ISOVUE-300) INJECTION 61%
100.0000 mL | Freq: Once | INTRAVENOUS | Status: AC | PRN
  Administered 2017-03-15: 100 mL via INTRAVENOUS

## 2017-03-15 MED ORDER — IOPAMIDOL (ISOVUE-300) INJECTION 61%
INTRAVENOUS | Status: AC
  Filled 2017-03-15: qty 100

## 2017-03-16 DIAGNOSIS — H268 Other specified cataract: Secondary | ICD-10-CM | POA: Diagnosis not present

## 2017-03-16 DIAGNOSIS — H5703 Miosis: Secondary | ICD-10-CM | POA: Diagnosis not present

## 2017-03-16 DIAGNOSIS — H2512 Age-related nuclear cataract, left eye: Secondary | ICD-10-CM | POA: Diagnosis not present

## 2017-03-20 ENCOUNTER — Encounter: Payer: Self-pay | Admitting: Internal Medicine

## 2017-03-20 ENCOUNTER — Other Ambulatory Visit (HOSPITAL_BASED_OUTPATIENT_CLINIC_OR_DEPARTMENT_OTHER): Payer: Medicare Other

## 2017-03-20 ENCOUNTER — Ambulatory Visit (HOSPITAL_BASED_OUTPATIENT_CLINIC_OR_DEPARTMENT_OTHER): Payer: Medicare Other | Admitting: Internal Medicine

## 2017-03-20 ENCOUNTER — Ambulatory Visit (HOSPITAL_BASED_OUTPATIENT_CLINIC_OR_DEPARTMENT_OTHER): Payer: Medicare Other

## 2017-03-20 VITALS — BP 120/56 | HR 72 | Temp 98.1°F | Resp 18 | Ht 64.0 in | Wt 167.4 lb

## 2017-03-20 DIAGNOSIS — Z5111 Encounter for antineoplastic chemotherapy: Secondary | ICD-10-CM | POA: Diagnosis present

## 2017-03-20 DIAGNOSIS — F419 Anxiety disorder, unspecified: Secondary | ICD-10-CM | POA: Diagnosis not present

## 2017-03-20 DIAGNOSIS — C7931 Secondary malignant neoplasm of brain: Secondary | ICD-10-CM

## 2017-03-20 DIAGNOSIS — C3411 Malignant neoplasm of upper lobe, right bronchus or lung: Secondary | ICD-10-CM | POA: Diagnosis not present

## 2017-03-20 DIAGNOSIS — C7949 Secondary malignant neoplasm of other parts of nervous system: Secondary | ICD-10-CM

## 2017-03-20 DIAGNOSIS — F411 Generalized anxiety disorder: Secondary | ICD-10-CM

## 2017-03-20 LAB — CBC WITH DIFFERENTIAL/PLATELET
BASO%: 0.1 % (ref 0.0–2.0)
Basophils Absolute: 0 10*3/uL (ref 0.0–0.1)
EOS%: 0 % (ref 0.0–7.0)
Eosinophils Absolute: 0 10*3/uL (ref 0.0–0.5)
HCT: 38.2 % (ref 34.8–46.6)
HGB: 12.4 g/dL (ref 11.6–15.9)
LYMPH%: 4.2 % — ABNORMAL LOW (ref 14.0–49.7)
MCH: 32.9 pg (ref 25.1–34.0)
MCHC: 32.5 g/dL (ref 31.5–36.0)
MCV: 101.3 fL — ABNORMAL HIGH (ref 79.5–101.0)
MONO#: 0.7 10*3/uL (ref 0.1–0.9)
MONO%: 6.4 % (ref 0.0–14.0)
NEUT#: 9.4 10*3/uL — ABNORMAL HIGH (ref 1.5–6.5)
NEUT%: 89.3 % — ABNORMAL HIGH (ref 38.4–76.8)
Platelets: 282 10*3/uL (ref 145–400)
RBC: 3.77 10*6/uL (ref 3.70–5.45)
RDW: 14.8 % — ABNORMAL HIGH (ref 11.2–14.5)
WBC: 10.6 10*3/uL — ABNORMAL HIGH (ref 3.9–10.3)
lymph#: 0.4 10*3/uL — ABNORMAL LOW (ref 0.9–3.3)
nRBC: 0 % (ref 0–0)

## 2017-03-20 LAB — COMPREHENSIVE METABOLIC PANEL
ALT: 15 U/L (ref 0–55)
AST: 11 U/L (ref 5–34)
Albumin: 3.5 g/dL (ref 3.5–5.0)
Alkaline Phosphatase: 90 U/L (ref 40–150)
Anion Gap: 9 mEq/L (ref 3–11)
BUN: 13.9 mg/dL (ref 7.0–26.0)
CO2: 25 mEq/L (ref 22–29)
Calcium: 9.8 mg/dL (ref 8.4–10.4)
Chloride: 105 mEq/L (ref 98–109)
Creatinine: 0.8 mg/dL (ref 0.6–1.1)
EGFR: 80 mL/min/{1.73_m2} — ABNORMAL LOW (ref >90)
Glucose: 104 mg/dl (ref 70–140)
Potassium: 4.6 mEq/L (ref 3.5–5.1)
Sodium: 139 mEq/L (ref 136–145)
Total Bilirubin: 0.35 mg/dL (ref 0.20–1.20)
Total Protein: 6.8 g/dL (ref 6.4–8.3)

## 2017-03-20 MED ORDER — PEMETREXED DISODIUM CHEMO INJECTION 500 MG
500.0000 mg/m2 | Freq: Once | INTRAVENOUS | Status: AC
  Administered 2017-03-20: 900 mg via INTRAVENOUS
  Filled 2017-03-20: qty 20

## 2017-03-20 MED ORDER — DEXAMETHASONE SODIUM PHOSPHATE 10 MG/ML IJ SOLN
10.0000 mg | Freq: Once | INTRAMUSCULAR | Status: AC
  Administered 2017-03-20: 10 mg via INTRAVENOUS

## 2017-03-20 MED ORDER — SODIUM CHLORIDE 0.9 % IV SOLN
Freq: Once | INTRAVENOUS | Status: AC
  Administered 2017-03-20: 10:00:00 via INTRAVENOUS

## 2017-03-20 MED ORDER — ALPRAZOLAM 0.25 MG PO TABS: 0.2500 mg | ORAL_TABLET | Freq: Three times a day (TID) | ORAL | 0 refills | Status: DC | PRN

## 2017-03-20 MED ORDER — ONDANSETRON HCL 8 MG PO TABS
8.0000 mg | ORAL_TABLET | Freq: Once | ORAL | Status: AC
  Administered 2017-03-20: 8 mg via ORAL

## 2017-03-20 MED ORDER — DEXAMETHASONE SODIUM PHOSPHATE 10 MG/ML IJ SOLN
INTRAMUSCULAR | Status: AC
Start: 2017-03-20 — End: 2017-03-20
  Filled 2017-03-20: qty 1

## 2017-03-20 MED ORDER — ONDANSETRON HCL 8 MG PO TABS
ORAL_TABLET | ORAL | Status: AC
  Filled 2017-03-20: qty 1

## 2017-03-20 NOTE — Patient Instructions (Signed)
[name] Discharge Instructions for Patients Receiving Chemotherapy  Today you received the following chemotherapy agents Alimta  To help prevent nausea and vomiting after your treatment, we encourage you to take your nausea medication as needed   If you develop nausea and vomiting that is not controlled by your nausea medication, call the clinic.   BELOW ARE SYMPTOMS THAT SHOULD BE REPORTED IMMEDIATELY:  *FEVER GREATER THAN 100.5 F  *CHILLS WITH OR WITHOUT FEVER  NAUSEA AND VOMITING THAT IS NOT CONTROLLED WITH YOUR NAUSEA MEDICATION  *UNUSUAL SHORTNESS OF BREATH  *UNUSUAL BRUISING OR BLEEDING  TENDERNESS IN MOUTH AND THROAT WITH OR WITHOUT PRESENCE OF ULCERS  *URINARY PROBLEMS  *BOWEL PROBLEMS  UNUSUAL RASH Items with * indicate a potential emergency and should be followed up as soon as possible.  Feel free to call the clinic you have any questions or concerns. The clinic phone number is [phone].  Please show the [facility] at check-in to the Emergency Department and triage nurse.

## 2017-03-20 NOTE — Progress Notes (Signed)
[place] [phone]   [phone]  OFFICE PROGRESS NOTE  Alexis Roers, MD [address] [place] [address]  DIAGNOSIS: Stage IV (T2a, N0, M1b) non-small cell lung cancer, adenocarcinoma with negative EGFR and ALK mutations diagnosed in January 2015 and presented with right upper lobe lung mass in addition to a solitary brain metastasis.  PRIOR THERAPY: 1) Status post stereotactic radiotherapy to a solitary right parietal brain lesion under the care of Dr. [name] on 10/16/2013. 2) Status post palliative radiotherapy to the right lung tumor under the care of Dr. [name] completed on 12/05/2013. 3) Systemic chemotherapy with carboplatin for AUC of 5 and Alimta 500 mg/M2 every 3 weeks. First dose Jan 06 2014. Status post 6 cycles.  CURRENT THERAPY: Systemic chemotherapy with maintenance Alimta 500 MG/M2 every 3 weeks, status post 48 cycles.  INTERVAL HISTORY: Alexis [name] 66 y.o. female returns to the clinic today for follow-up visit accompanied by her husband. The patient has no complaints today. She had a cataract surgery done recently. She denied having any significant chest pain, shortness of breath, cough or hemoptysis. She denied having any fever or chills. She has no nausea, vomiting, diarrhea or constipation. She tolerated the last cycle of her treatment with Alimta fairly well. He had repeat CT scan of the chest, abdomen and pelvis performed recently and she is here for evaluation and discussion of her scan results.  MEDICAL HISTORY: Past Medical History:  Diagnosis Date  . Anxiety   . Anxiety 06/20/2016  . Cervical cancer ([place])   . Encounter for antineoplastic chemotherapy 07/20/2015  . 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  MEDICATIONS:  Current Outpatient Prescriptions  Medication Sig Dispense Refill  .  acetaminophen (TYLENOL) 500 MG tablet Take 500 mg by mouth every 6 (six) hours as needed for mild pain or headache. Reported on 11/02/2015    . ALPRAZolam (XANAX) 0.25 MG tablet Take 1 tablet (0.25 mg total) by mouth 3 (three) times daily as needed. for anxiety 45 tablet 0  . Ascorbic Acid (VITAMIN C GUMMIE PO) Take 1 each by mouth every morning.    Marland Kitchen dexamethasone (DECADRON) 4 MG tablet TAKE 1 TABLET BY MOUTH TWICE A DAY THE DAY BEFORE, DAY OF AND DAY AFTER THE CHEMOTHERAPY EVERY 3 WKS 40 tablet 1  . folic acid (FOLVITE) 1 MG tablet Take 1 tablet (1 mg total) by mouth daily. 90 tablet 0  . Multiple Vitamin (MULTIVITAMIN WITH MINERALS) TABS tablet Take 1 tablet by mouth every morning.    Marland Kitchen omeprazole (PRILOSEC) 20 MG capsule Take 1 capsule (20 mg total) by mouth daily. As needed for reflux or indigestion. 42 capsule PRN  . OVER THE COUNTER MEDICATION Take 1 tablet by mouth every morning. (Vitamin A)    . pentoxifylline (TRENTAL) 400 MG CR tablet TAKE ONE TABLET BY MOUTH TWICE DAILY, ALSO  TAKE  VITAMIN  E  400  UNITS  TWICE  DAILY  WITH  PENTOXIFYLLINE 60 tablet 0  . senna-docusate (SENOKOT-S) 8.6-50 MG tablet Take 1 tablet by mouth daily. 30 tablet prn  . ondansetron (ZOFRAN) 8 MG tablet Take 1 tablet (8 mg total) by mouth every 8 (eight) hours as needed for nausea or vomiting. (Patient not taking: Reported on 03/20/2017) 30 tablet 0  . prochlorperazine (COMPAZINE) 10 MG  tablet Take 1 tablet (10 mg total) by mouth every 6 (six) hours as needed for nausea or vomiting. (Patient not taking: Reported on 03/20/2017) 60 tablet 0   No current facility-administered medications for this visit.     SURGICAL HISTORY:  Past Surgical History:  Procedure Laterality Date  . ABDOMINAL HYSTERECTOMY    . COLOSTOMY TAKEDOWN N/A 07/10/2014   Procedure: LAPAROSCOPIC LYSIS OF ADHESIONS (90 MIN) LAPAROSCOPIC ASSISTED COLOSTOMY CLOSURE, RIGID PROCTOSIGMOIDOSCOPY;  Surgeon: Jackolyn Confer, MD;  Location: [facility];  Service:  General;  Laterality: N/A;  . LAPAROTOMY N/A 11/03/2013   Procedure: EXPLORATORY LAPAROTOMY, DRAINAGE OF INTRA  ABDOMINAL ABSCESSES, MOBILIZATION OF SPLENIC FLEXURE, SIGMOID COLECTOMY WITH COLOSTOMY;  Surgeon: Odis Hollingshead, MD;  Location: [facility];  Service: General;  Laterality: N/A;  . VIDEO BRONCHOSCOPY Bilateral 08/30/2013   Procedure: VIDEO BRONCHOSCOPY WITH FLUORO;  Surgeon: Tanda Rockers, MD;  Location: [facility];  Service: Cardiopulmonary;  Laterality: Bilateral;    REVIEW OF SYSTEMS:  Constitutional: negative Eyes: negative Ears, nose, mouth, throat, and face: negative Respiratory: negative Cardiovascular: negative Gastrointestinal: negative Genitourinary:negative Integument/breast: negative Hematologic/lymphatic: negative Musculoskeletal:negative Neurological: negative Behavioral/Psych: negative Endocrine: negative Allergic/Immunologic: negative   PHYSICAL EXAMINATION: General appearance: alert, cooperative and no distress Head: Normocephalic, without obvious abnormality, atraumatic Neck: no adenopathy, no JVD, supple, symmetrical, trachea midline and thyroid not enlarged, symmetric, no tenderness/mass/nodules Lymph nodes: Cervical, supraclavicular, and axillary nodes normal. Resp: clear to auscultation bilaterally Back: symmetric, no curvature. ROM normal. No CVA tenderness. Cardio: regular rate and rhythm, S1, S2 normal, no murmur, click, rub or gallop GI: soft, non-tender; bowel sounds normal; no masses,  no organomegaly Extremities: extremities normal, atraumatic, no cyanosis or edema Neurologic: Alert and oriented X 3, normal strength and tone. Normal symmetric reflexes. Normal coordination and gait  ECOG PERFORMANCE STATUS: 0 - Asymptomatic  Blood pressure (!) 120/56, pulse 72, temperature 98.1 F (36.7 C), temperature source Oral, resp. rate 18, height _0  (1.626 m), weight 167 lb 6.4 oz (75.9 kg), SpO2 100 %.  LABORATORY DATA: Lab Results  Component  Value Date   WBC 10.6 (H) 03/20/2017   HGB 12.4 03/20/2017   HCT 38.2 03/20/2017   MCV 101.3 (H) 03/20/2017   PLT 282 03/20/2017      Chemistry      Component Value Date/Time   NA 141 02/27/2017 0844   K 4.6 02/27/2017 0844   CL 100 07/12/2014 0512   CO2 25 02/27/2017 0844   BUN 14.7 02/27/2017 0844   CREATININE 0.8 02/27/2017 0844      Component Value Date/Time   CALCIUM 10.2 02/27/2017 0844   ALKPHOS 86 02/27/2017 0844   AST 12 02/27/2017 0844   ALT 11 02/27/2017 0844   BILITOT 0.41 02/27/2017 0844       RADIOGRAPHIC STUDIES: Ct Chest W Contrast  Result Date: 03/15/2017 CLINICAL DATA:  Stage IV right upper lobe lung adenocarcinoma diagnosed January 2015 with brain metastasis status post palliative radiotherapy to the right lung neoplasm completed 12/05/2013, stereotactic brain radiotherapy with ongoing chemotherapy. Restaging. EXAM: CT CHEST, ABDOMEN, AND PELVIS WITH CONTRAST TECHNIQUE: Multidetector CT imaging of the chest, abdomen and pelvis was performed following the standard protocol during bolus administration of intravenous contrast. CONTRAST:  183m ISOVUE-300 IOPAMIDOL (ISOVUE-300) INJECTION 61% COMPARISON:  12/23/2016 CT chest, abdomen and pelvis. FINDINGS: CT CHEST FINDINGS Cardiovascular: Normal heart size. No significant pericardial fluid/thickening. Atherosclerotic nonaneurysmal thoracic aorta. Normal caliber pulmonary arteries. No central pulmonary emboli. Mediastinum/Nodes: Stable subcentimeter hypodense left thyroid lobe nodule. Unremarkable  esophagus. No pathologically enlarged axillary, mediastinal or hilar lymph nodes. Lungs/Pleura: No pneumothorax. No pleural effusion. Chronic posterior apical right upper lobe 2.4 x 1.6 cm nodular opacity (series 6/ image 27) is stable back to at least 06/17/2016 and considered benign. Adjacent anterior apical right upper lobe 1.7 x 1.1 cm nodular opacity (series 6/ image 26) is unchanged back to the 10/21/2016 although new since  08/18/2016. There is a new fluid level and volume loss superior to these nodular lesions within the adjacent apical right upper lobe bulla. Otherwise no acute consolidative airspace disease or new significant pulmonary nodules. Moderate centrilobular and paraseptal emphysema. Musculoskeletal: No aggressive appearing focal osseous lesions. Moderate thoracic spondylosis. CT ABDOMEN PELVIS FINDINGS Hepatobiliary: Normal liver with no liver mass. Normal gallbladder with no radiopaque cholelithiasis. No biliary ductal dilatation. Pancreas: Normal, with no mass or duct dilation. Spleen: Normal size. No mass. Adrenals/Urinary Tract: Normal adrenals. Normal kidneys with no hydronephrosis and no renal mass. Normal bladder. Stomach/Bowel: Grossly normal stomach. Normal caliber small bowel with no small bowel wall thickening. Normal appendix . Stable postsurgical changes from subtotal distal colectomy with intact appearing distal colonic anastomosis. No large bowel wall thickening or pericolonic fat stranding. Vascular/Lymphatic: Atherosclerotic nonaneurysmal abdominal aorta. Patent portal, splenic and renal veins. No pathologically enlarged lymph nodes in the abdomen or pelvis. Reproductive: Status post hysterectomy, with no abnormal findings at the vaginal cuff. No adnexal mass. Other: No pneumoperitoneum, ascites or focal fluid collection. Musculoskeletal: No aggressive appearing focal osseous lesions. Moderate lumbar spondylosis. IMPRESSION: 1. Previously described nodular opacities at the right lung apex are stable. However, there is new layering fluid and volume loss within the adjacent apical right upper lobe bulla. Evolving inflammatory changes are favored, although indolent locally recurrent neoplasm is difficult to exclude given the ongoing changes in this location. Consider further characterization with PET-CT versus ongoing short-term chest CT surveillance. 2. Otherwise no findings of metastatic disease in the  chest, abdomen or pelvis. Electronically Signed   By: Ilona Sorrel M.D.   On: 03/15/2017 15:19   Ct Abdomen Pelvis W Contrast  Result Date: 03/15/2017 CLINICAL DATA:  Stage IV right upper lobe lung adenocarcinoma diagnosed January 2015 with brain metastasis status post palliative radiotherapy to the right lung neoplasm completed 12/05/2013, stereotactic brain radiotherapy with ongoing chemotherapy. Restaging. EXAM: CT CHEST, ABDOMEN, AND PELVIS WITH CONTRAST TECHNIQUE: Multidetector CT imaging of the chest, abdomen and pelvis was performed following the standard protocol during bolus administration of intravenous contrast. CONTRAST:  160m ISOVUE-300 IOPAMIDOL (ISOVUE-300) INJECTION 61% COMPARISON:  12/23/2016 CT chest, abdomen and pelvis. FINDINGS: CT CHEST FINDINGS Cardiovascular: Normal heart size. No significant pericardial fluid/thickening. Atherosclerotic nonaneurysmal thoracic aorta. Normal caliber pulmonary arteries. No central pulmonary emboli. Mediastinum/Nodes: Stable subcentimeter hypodense left thyroid lobe nodule. Unremarkable esophagus. No pathologically enlarged axillary, mediastinal or hilar lymph nodes. Lungs/Pleura: No pneumothorax. No pleural effusion. Chronic posterior apical right upper lobe 2.4 x 1.6 cm nodular opacity (series 6/ image 27) is stable back to at least 06/17/2016 and considered benign. Adjacent anterior apical right upper lobe 1.7 x 1.1 cm nodular opacity (series 6/ image 26) is unchanged back to the 10/21/2016 although new since 08/18/2016. There is a new fluid level and volume loss superior to these nodular lesions within the adjacent apical right upper lobe bulla. Otherwise no acute consolidative airspace disease or new significant pulmonary nodules. Moderate centrilobular and paraseptal emphysema. Musculoskeletal: No aggressive appearing focal osseous lesions. Moderate thoracic spondylosis. CT ABDOMEN PELVIS FINDINGS Hepatobiliary: Normal liver with no liver mass.  Normal  gallbladder with no radiopaque cholelithiasis. No biliary ductal dilatation. Pancreas: Normal, with no mass or duct dilation. Spleen: Normal size. No mass. Adrenals/Urinary Tract: Normal adrenals. Normal kidneys with no hydronephrosis and no renal mass. Normal bladder. Stomach/Bowel: Grossly normal stomach. Normal caliber small bowel with no small bowel wall thickening. Normal appendix . Stable postsurgical changes from subtotal distal colectomy with intact appearing distal colonic anastomosis. No large bowel wall thickening or pericolonic fat stranding. Vascular/Lymphatic: Atherosclerotic nonaneurysmal abdominal aorta. Patent portal, splenic and renal veins. No pathologically enlarged lymph nodes in the abdomen or pelvis. Reproductive: Status post hysterectomy, with no abnormal findings at the vaginal cuff. No adnexal mass. Other: No pneumoperitoneum, ascites or focal fluid collection. Musculoskeletal: No aggressive appearing focal osseous lesions. Moderate lumbar spondylosis. IMPRESSION: 1. Previously described nodular opacities at the right lung apex are stable. However, there is new layering fluid and volume loss within the adjacent apical right upper lobe bulla. Evolving inflammatory changes are favored, although indolent locally recurrent neoplasm is difficult to exclude given the ongoing changes in this location. Consider further characterization with PET-CT versus ongoing short-term chest CT surveillance. 2. Otherwise no findings of metastatic disease in the chest, abdomen or pelvis. Electronically Signed   By: Ilona Sorrel M.D.   On: 03/15/2017 15:19    ASSESSMENT AND PLAN:  This is a very pleasant 66 years old white female with metastatic non-small cell lung cancer, adenocarcinoma status post induction systemic chemotherapy with carboplatin and Alimta with partial response. She is currently on maintenance treatment with single agent Alimta status post 48 cycles and has been tolerating this treatment  well. She had repeat CT scan of the chest, abdomen and pelvis performed recently. I personally and independently reviewed the scan images and discuss the results with the patient and her husband. Her scan showed no clear evidence for disease progression but there was new layering fluid within the adjacent apical right upper lobe bulla. This is likely inflammatory. I discussed the scan with the patient and recommended for her to continue with her current treatment with single agent Alimta for now. I will continue to monitor this change in her scan enclosed on the upcoming scan in 9 weeks. The patient would come back for follow-up visit in 3 weeks for reevaluation before starting cycle #50. For anxiety, I gave her a refill of Xanax today. The patient was advised to call immediately if she has any concerning symptoms in the interval. The patient voices understanding of current disease status and treatment options and is in agreement with the current care plan. All questions were answered. The patient knows to call the clinic with any problems, questions or concerns. We can certainly see the patient much sooner if necessary.  Disclaimer: This note was dictated with voice recognition software. Similar sounding words can inadvertently be transcribed and may not be corrected upon review.

## 2017-04-10 ENCOUNTER — Encounter: Payer: Self-pay | Admitting: Internal Medicine

## 2017-04-10 ENCOUNTER — Ambulatory Visit (HOSPITAL_BASED_OUTPATIENT_CLINIC_OR_DEPARTMENT_OTHER): Payer: Medicare Other | Admitting: Internal Medicine

## 2017-04-10 ENCOUNTER — Ambulatory Visit (HOSPITAL_BASED_OUTPATIENT_CLINIC_OR_DEPARTMENT_OTHER): Payer: Medicare Other

## 2017-04-10 ENCOUNTER — Other Ambulatory Visit (HOSPITAL_BASED_OUTPATIENT_CLINIC_OR_DEPARTMENT_OTHER): Payer: Medicare Other

## 2017-04-10 ENCOUNTER — Telehealth: Payer: Self-pay

## 2017-04-10 VITALS — BP 113/60 | HR 82 | Temp 98.0°F | Resp 20 | Ht 64.0 in | Wt 170.4 lb

## 2017-04-10 DIAGNOSIS — Y842 Radiological procedure and radiotherapy as the cause of abnormal reaction of the patient, or of later complication, without mention of misadventure at the time of the procedure: Secondary | ICD-10-CM

## 2017-04-10 DIAGNOSIS — C3411 Malignant neoplasm of upper lobe, right bronchus or lung: Secondary | ICD-10-CM

## 2017-04-10 DIAGNOSIS — Z5111 Encounter for antineoplastic chemotherapy: Secondary | ICD-10-CM | POA: Diagnosis present

## 2017-04-10 DIAGNOSIS — C7949 Secondary malignant neoplasm of other parts of nervous system: Secondary | ICD-10-CM

## 2017-04-10 DIAGNOSIS — C7931 Secondary malignant neoplasm of brain: Secondary | ICD-10-CM

## 2017-04-10 DIAGNOSIS — I6789 Other cerebrovascular disease: Secondary | ICD-10-CM

## 2017-04-10 LAB — COMPREHENSIVE METABOLIC PANEL
ALT: 13 U/L (ref 0–55)
AST: 11 U/L (ref 5–34)
Albumin: 3.4 g/dL — ABNORMAL LOW (ref 3.5–5.0)
Alkaline Phosphatase: 88 U/L (ref 40–150)
Anion Gap: 8 mEq/L (ref 3–11)
BUN: 11.2 mg/dL (ref 7.0–26.0)
CO2: 26 mEq/L (ref 22–29)
Calcium: 9.6 mg/dL (ref 8.4–10.4)
Chloride: 107 mEq/L (ref 98–109)
Creatinine: 0.8 mg/dL (ref 0.6–1.1)
EGFR: 81 mL/min/{1.73_m2} — ABNORMAL LOW (ref >90)
Glucose: 114 mg/dl (ref 70–140)
Potassium: 4.3 mEq/L (ref 3.5–5.1)
Sodium: 141 mEq/L (ref 136–145)
Total Bilirubin: 0.43 mg/dL (ref 0.20–1.20)
Total Protein: 6.7 g/dL (ref 6.4–8.3)

## 2017-04-10 LAB — CBC WITH DIFFERENTIAL/PLATELET
BASO%: 0 % (ref 0.0–2.0)
Basophils Absolute: 0 10*3/uL (ref 0.0–0.1)
EOS%: 0 % (ref 0.0–7.0)
Eosinophils Absolute: 0 10*3/uL (ref 0.0–0.5)
HCT: 36.4 % (ref 34.8–46.6)
HGB: 11.7 g/dL (ref 11.6–15.9)
LYMPH%: 5.3 % — ABNORMAL LOW (ref 14.0–49.7)
MCH: 32.7 pg (ref 25.1–34.0)
MCHC: 32.1 g/dL (ref 31.5–36.0)
MCV: 101.7 fL — ABNORMAL HIGH (ref 79.5–101.0)
MONO#: 0.6 10*3/uL (ref 0.1–0.9)
MONO%: 8.5 % (ref 0.0–14.0)
NEUT#: 6.2 10*3/uL (ref 1.5–6.5)
NEUT%: 86.2 % — ABNORMAL HIGH (ref 38.4–76.8)
Platelets: 251 10*3/uL (ref 145–400)
RBC: 3.58 10*6/uL — ABNORMAL LOW (ref 3.70–5.45)
RDW: 14.6 % — ABNORMAL HIGH (ref 11.2–14.5)
WBC: 7.2 10*3/uL (ref 3.9–10.3)
lymph#: 0.4 10*3/uL — ABNORMAL LOW (ref 0.9–3.3)

## 2017-04-10 MED ORDER — DEXAMETHASONE SODIUM PHOSPHATE 10 MG/ML IJ SOLN
INTRAMUSCULAR | Status: AC
  Filled 2017-04-10: qty 1

## 2017-04-10 MED ORDER — ONDANSETRON HCL 8 MG PO TABS
ORAL_TABLET | ORAL | Status: AC
  Filled 2017-04-10: qty 1

## 2017-04-10 MED ORDER — DEXAMETHASONE SODIUM PHOSPHATE 10 MG/ML IJ SOLN
10.0000 mg | Freq: Once | INTRAMUSCULAR | Status: AC
  Administered 2017-04-10: 10 mg via INTRAVENOUS

## 2017-04-10 MED ORDER — SODIUM CHLORIDE 0.9 % IV SOLN
Freq: Once | INTRAVENOUS | Status: AC
  Administered 2017-04-10: 10:00:00 via INTRAVENOUS

## 2017-04-10 MED ORDER — ONDANSETRON HCL 8 MG PO TABS
8.0000 mg | ORAL_TABLET | Freq: Once | ORAL | Status: AC
  Administered 2017-04-10: 8 mg via ORAL

## 2017-04-10 MED ORDER — SODIUM CHLORIDE 0.9 % IV SOLN
500.0000 mg/m2 | Freq: Once | INTRAVENOUS | Status: AC
  Administered 2017-04-10: 900 mg via INTRAVENOUS
  Filled 2017-04-10: qty 20

## 2017-04-10 NOTE — Telephone Encounter (Signed)
patient already on schedule for 2 cycles and avs was printed for patient

## 2017-04-10 NOTE — Patient Instructions (Signed)
Alexis [name] Discharge Instructions for Patients Receiving Chemotherapy  Today you received the following chemotherapy agents Alimta  To help prevent nausea and vomiting after your treatment, we encourage you to take your nausea medication as needed   If you develop nausea and vomiting that is not controlled by your nausea medication, call the clinic.   BELOW ARE SYMPTOMS THAT SHOULD BE REPORTED IMMEDIATELY:  *FEVER GREATER THAN 100.5 F  *CHILLS WITH OR WITHOUT FEVER  NAUSEA AND VOMITING THAT IS NOT CONTROLLED WITH YOUR NAUSEA MEDICATION  *UNUSUAL SHORTNESS OF BREATH  *UNUSUAL BRUISING OR BLEEDING  TENDERNESS IN MOUTH AND THROAT WITH OR WITHOUT PRESENCE OF ULCERS  *URINARY PROBLEMS  *BOWEL PROBLEMS  UNUSUAL RASH Items with * indicate a potential emergency and should be followed up as soon as possible.  Feel free to call the clinic you have any questions or concerns. The clinic phone number is [phone].  Please show the [facility] at check-in to the Emergency Department and triage nurse.

## 2017-04-10 NOTE — Progress Notes (Signed)
[facility] [phone]   [phone]  OFFICE PROGRESS NOTE  [name], MD [address] [place] [address]  DIAGNOSIS: Stage IV (T2a, N0, M1b) non-small cell lung cancer, adenocarcinoma with negative EGFR and ALK mutations diagnosed in January 2015 and presented with right upper lobe lung mass in addition to a solitary brain metastasis.  PRIOR THERAPY: 1) Status post stereotactic radiotherapy to a solitary right parietal brain lesion under the care of Dr. [name] on 10/16/2013. 2) Status post palliative radiotherapy to the right lung tumor under the care of Dr. [name] completed on 12/05/2013. 3) Systemic chemotherapy with carboplatin for AUC of 5 and Alimta 500 mg/M2 every 3 weeks. First dose Jan 06 2014. Status post 6 cycles.  CURRENT THERAPY: Systemic chemotherapy with maintenance Alimta 500 MG/M2 every 3 weeks, status post 49 cycles.  INTERVAL HISTORY: Alexis [name] 66 y.o. female returns to the clinic today for follow-up visit accompanied by her husband. The patient is feeling fine today was no specific complaints. She continues to tolerate her maintenance treatment with single agent Alimta fairly well. She denied having any chest pain, shortness of breath, cough or hemoptysis. She denied having any fever or chills. She has no nausea, vomiting, diarrhea or constipation. She is here today for evaluation before starting cycle #50.   MEDICAL HISTORY: Past Medical History:  Diagnosis Date  . Anxiety   . Anxiety 06/20/2016  . Cervical cancer ([facility])   . Encounter for antineoplastic chemotherapy 07/20/2015  . 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  MEDICATIONS:  Current Outpatient Prescriptions  Medication Sig Dispense Refill  . acetaminophen (TYLENOL) 500 MG tablet Take 500 mg by mouth every 6 (six) hours as needed for mild  pain or headache. Reported on 11/02/2015    . ALPRAZolam (XANAX) 0.25 MG tablet Take 1 tablet (0.25 mg total) by mouth 3 (three) times daily as needed. for anxiety 45 tablet 0  . Ascorbic Acid (VITAMIN C GUMMIE PO) Take 1 each by mouth every morning.    Marland Kitchen dexamethasone (DECADRON) 4 MG tablet TAKE 1 TABLET BY MOUTH TWICE A DAY THE DAY BEFORE, DAY OF AND DAY AFTER THE CHEMOTHERAPY EVERY 3 WKS 40 tablet 1  . folic acid (FOLVITE) 1 MG tablet Take 1 tablet (1 mg total) by mouth daily. 90 tablet 0  . Multiple Vitamin (MULTIVITAMIN WITH MINERALS) TABS tablet Take 1 tablet by mouth every morning.    Marland Kitchen omeprazole (PRILOSEC) 20 MG capsule Take 1 capsule (20 mg total) by mouth daily. As needed for reflux or indigestion. 42 capsule PRN  . ondansetron (ZOFRAN) 8 MG tablet Take 1 tablet (8 mg total) by mouth every 8 (eight) hours as needed for nausea or vomiting. (Patient not taking: Reported on 03/20/2017) 30 tablet 0  . OVER THE COUNTER MEDICATION Take 1 tablet by mouth every morning. (Vitamin A)    . pentoxifylline (TRENTAL) 400 MG CR tablet TAKE ONE TABLET BY MOUTH TWICE DAILY, ALSO  TAKE  VITAMIN  E  400  UNITS  TWICE  DAILY  WITH  PENTOXIFYLLINE 60 tablet 0  . prochlorperazine (COMPAZINE) 10 MG tablet Take 1 tablet (10 mg total) by mouth every 6 (six) hours as needed for nausea or vomiting. (Patient not taking: Reported on 03/20/2017) 60 tablet 0  . senna-docusate (SENOKOT-S) 8.6-50  MG tablet Take 1 tablet by mouth daily. 30 tablet prn   No current facility-administered medications for this visit.     SURGICAL HISTORY:  Past Surgical History:  Procedure Laterality Date  . ABDOMINAL HYSTERECTOMY    . COLOSTOMY TAKEDOWN N/A 07/10/2014   Procedure: LAPAROSCOPIC LYSIS OF ADHESIONS (90 MIN) LAPAROSCOPIC ASSISTED COLOSTOMY CLOSURE, RIGID PROCTOSIGMOIDOSCOPY;  Surgeon: Jackolyn Confer, MD;  Location: [facility];  Service: General;  Laterality: N/A;  . LAPAROTOMY N/A 11/03/2013   Procedure: EXPLORATORY LAPAROTOMY,  DRAINAGE OF INTRA  ABDOMINAL ABSCESSES, MOBILIZATION OF SPLENIC FLEXURE, SIGMOID COLECTOMY WITH COLOSTOMY;  Surgeon: Odis Hollingshead, MD;  Location: [facility];  Service: General;  Laterality: N/A;  . VIDEO BRONCHOSCOPY Bilateral 08/30/2013   Procedure: VIDEO BRONCHOSCOPY WITH FLUORO;  Surgeon: Tanda Rockers, MD;  Location: [facility];  Service: Cardiopulmonary;  Laterality: Bilateral;    REVIEW OF SYSTEMS:  A comprehensive review of systems was negative.   PHYSICAL EXAMINATION: General appearance: alert, cooperative and no distress Head: Normocephalic, without obvious abnormality, atraumatic Neck: no adenopathy, no JVD, supple, symmetrical, trachea midline and thyroid not enlarged, symmetric, no tenderness/mass/nodules Lymph nodes: Cervical, supraclavicular, and axillary nodes normal. Resp: clear to auscultation bilaterally Back: symmetric, no curvature. ROM normal. No CVA tenderness. Cardio: regular rate and rhythm, S1, S2 normal, no murmur, click, rub or gallop GI: soft, non-tender; bowel sounds normal; no masses,  no organomegaly Extremities: extremities normal, atraumatic, no cyanosis or edema  ECOG PERFORMANCE STATUS: 0 - Asymptomatic  Blood pressure 113/60, pulse 82, temperature 98 F (36.7 C), temperature source Oral, resp. rate 20, height _0  (1.626 m), weight 170 lb 6.4 oz (77.3 kg), SpO2 100 %.  LABORATORY DATA: Lab Results  Component Value Date   WBC 7.2 04/10/2017   HGB 11.7 04/10/2017   HCT 36.4 04/10/2017   MCV 101.7 (H) 04/10/2017   PLT 251 04/10/2017      Chemistry      Component Value Date/Time   NA 141 04/10/2017 0859   K 4.3 04/10/2017 0859   CL 100 07/12/2014 0512   CO2 26 04/10/2017 0859   BUN 11.2 04/10/2017 0859   CREATININE 0.8 04/10/2017 0859      Component Value Date/Time   CALCIUM 9.6 04/10/2017 0859   ALKPHOS 88 04/10/2017 0859   AST 11 04/10/2017 0859   ALT 13 04/10/2017 0859   BILITOT 0.43 04/10/2017 0859       RADIOGRAPHIC  STUDIES: Ct Chest W Contrast  Result Date: 03/15/2017 CLINICAL DATA:  Stage IV right upper lobe lung adenocarcinoma diagnosed January 2015 with brain metastasis status post palliative radiotherapy to the right lung neoplasm completed 12/05/2013, stereotactic brain radiotherapy with ongoing chemotherapy. Restaging. EXAM: CT CHEST, ABDOMEN, AND PELVIS WITH CONTRAST TECHNIQUE: Multidetector CT imaging of the chest, abdomen and pelvis was performed following the standard protocol during bolus administration of intravenous contrast. CONTRAST:  128m ISOVUE-300 IOPAMIDOL (ISOVUE-300) INJECTION 61% COMPARISON:  12/23/2016 CT chest, abdomen and pelvis. FINDINGS: CT CHEST FINDINGS Cardiovascular: Normal heart size. No significant pericardial fluid/thickening. Atherosclerotic nonaneurysmal thoracic aorta. Normal caliber pulmonary arteries. No central pulmonary emboli. Mediastinum/Nodes: Stable subcentimeter hypodense left thyroid lobe nodule. Unremarkable esophagus. No pathologically enlarged axillary, mediastinal or hilar lymph nodes. Lungs/Pleura: No pneumothorax. No pleural effusion. Chronic posterior apical right upper lobe 2.4 x 1.6 cm nodular opacity (series 6/ image 27) is stable back to at least 06/17/2016 and considered benign. Adjacent anterior apical right upper lobe 1.7 x 1.1 cm nodular opacity (series 6/ image 26) is unchanged  back to the 10/21/2016 although new since 08/18/2016. There is a new fluid level and volume loss superior to these nodular lesions within the adjacent apical right upper lobe bulla. Otherwise no acute consolidative airspace disease or new significant pulmonary nodules. Moderate centrilobular and paraseptal emphysema. Musculoskeletal: No aggressive appearing focal osseous lesions. Moderate thoracic spondylosis. CT ABDOMEN PELVIS FINDINGS Hepatobiliary: Normal liver with no liver mass. Normal gallbladder with no radiopaque cholelithiasis. No biliary ductal dilatation. Pancreas: Normal,  with no mass or duct dilation. Spleen: Normal size. No mass. Adrenals/Urinary Tract: Normal adrenals. Normal kidneys with no hydronephrosis and no renal mass. Normal bladder. Stomach/Bowel: Grossly normal stomach. Normal caliber small bowel with no small bowel wall thickening. Normal appendix . Stable postsurgical changes from subtotal distal colectomy with intact appearing distal colonic anastomosis. No large bowel wall thickening or pericolonic fat stranding. Vascular/Lymphatic: Atherosclerotic nonaneurysmal abdominal aorta. Patent portal, splenic and renal veins. No pathologically enlarged lymph nodes in the abdomen or pelvis. Reproductive: Status post hysterectomy, with no abnormal findings at the vaginal cuff. No adnexal mass. Other: No pneumoperitoneum, ascites or focal fluid collection. Musculoskeletal: No aggressive appearing focal osseous lesions. Moderate lumbar spondylosis. IMPRESSION: 1. Previously described nodular opacities at the right lung apex are stable. However, there is new layering fluid and volume loss within the adjacent apical right upper lobe bulla. Evolving inflammatory changes are favored, although indolent locally recurrent neoplasm is difficult to exclude given the ongoing changes in this location. Consider further characterization with PET-CT versus ongoing short-term chest CT surveillance. 2. Otherwise no findings of metastatic disease in the chest, abdomen or pelvis. Electronically Signed   By: Ilona Sorrel M.D.   On: 03/15/2017 15:19   Ct Abdomen Pelvis W Contrast  Result Date: 03/15/2017 CLINICAL DATA:  Stage IV right upper lobe lung adenocarcinoma diagnosed January 2015 with brain metastasis status post palliative radiotherapy to the right lung neoplasm completed 12/05/2013, stereotactic brain radiotherapy with ongoing chemotherapy. Restaging. EXAM: CT CHEST, ABDOMEN, AND PELVIS WITH CONTRAST TECHNIQUE: Multidetector CT imaging of the chest, abdomen and pelvis was performed  following the standard protocol during bolus administration of intravenous contrast. CONTRAST:  169m ISOVUE-300 IOPAMIDOL (ISOVUE-300) INJECTION 61% COMPARISON:  12/23/2016 CT chest, abdomen and pelvis. FINDINGS: CT CHEST FINDINGS Cardiovascular: Normal heart size. No significant pericardial fluid/thickening. Atherosclerotic nonaneurysmal thoracic aorta. Normal caliber pulmonary arteries. No central pulmonary emboli. Mediastinum/Nodes: Stable subcentimeter hypodense left thyroid lobe nodule. Unremarkable esophagus. No pathologically enlarged axillary, mediastinal or hilar lymph nodes. Lungs/Pleura: No pneumothorax. No pleural effusion. Chronic posterior apical right upper lobe 2.4 x 1.6 cm nodular opacity (series 6/ image 27) is stable back to at least 06/17/2016 and considered benign. Adjacent anterior apical right upper lobe 1.7 x 1.1 cm nodular opacity (series 6/ image 26) is unchanged back to the 10/21/2016 although new since 08/18/2016. There is a new fluid level and volume loss superior to these nodular lesions within the adjacent apical right upper lobe bulla. Otherwise no acute consolidative airspace disease or new significant pulmonary nodules. Moderate centrilobular and paraseptal emphysema. Musculoskeletal: No aggressive appearing focal osseous lesions. Moderate thoracic spondylosis. CT ABDOMEN PELVIS FINDINGS Hepatobiliary: Normal liver with no liver mass. Normal gallbladder with no radiopaque cholelithiasis. No biliary ductal dilatation. Pancreas: Normal, with no mass or duct dilation. Spleen: Normal size. No mass. Adrenals/Urinary Tract: Normal adrenals. Normal kidneys with no hydronephrosis and no renal mass. Normal bladder. Stomach/Bowel: Grossly normal stomach. Normal caliber small bowel with no small bowel wall thickening. Normal appendix . Stable postsurgical changes from subtotal  distal colectomy with intact appearing distal colonic anastomosis. No large bowel wall thickening or pericolonic fat  stranding. Vascular/Lymphatic: Atherosclerotic nonaneurysmal abdominal aorta. Patent portal, splenic and renal veins. No pathologically enlarged lymph nodes in the abdomen or pelvis. Reproductive: Status post hysterectomy, with no abnormal findings at the vaginal cuff. No adnexal mass. Other: No pneumoperitoneum, ascites or focal fluid collection. Musculoskeletal: No aggressive appearing focal osseous lesions. Moderate lumbar spondylosis. IMPRESSION: 1. Previously described nodular opacities at the right lung apex are stable. However, there is new layering fluid and volume loss within the adjacent apical right upper lobe bulla. Evolving inflammatory changes are favored, although indolent locally recurrent neoplasm is difficult to exclude given the ongoing changes in this location. Consider further characterization with PET-CT versus ongoing short-term chest CT surveillance. 2. Otherwise no findings of metastatic disease in the chest, abdomen or pelvis. Electronically Signed   By: Ilona Sorrel M.D.   On: 03/15/2017 15:19    ASSESSMENT AND PLAN:  This is a very pleasant 66 years old white female with metastatic non-small cell lung cancer, adenocarcinoma status post induction systemic chemotherapy with carboplatin and Alimta with partial response. She is currently on maintenance treatment with single agent Alimta status post 49 cycles and has been tolerating this treatment well. I recommended for the patient to proceed with cycle #50 today as a scheduled. I will see her back for follow-up visit in 3 weeks for evaluation before starting the next cycle of her treatment. He was advised to call immediately if she has any concerning symptoms in the interval. The patient voices understanding of current disease status and treatment options and is in agreement with the current care plan. All questions were answered. The patient knows to call the clinic with any problems, questions or concerns. We can certainly see  the patient much sooner if necessary. I spent 10 minutes counseling the patient face to face. The total time spent in the appointment was 15 minutes. Disclaimer: This note was dictated with voice recognition software. Similar sounding words can inadvertently be transcribed and may not be corrected upon review.

## 2017-04-21 ENCOUNTER — Other Ambulatory Visit: Payer: Self-pay | Admitting: Internal Medicine

## 2017-04-21 DIAGNOSIS — C3490 Malignant neoplasm of unspecified part of unspecified bronchus or lung: Secondary | ICD-10-CM

## 2017-05-02 ENCOUNTER — Other Ambulatory Visit: Payer: Self-pay | Admitting: Internal Medicine

## 2017-05-02 ENCOUNTER — Encounter: Payer: Self-pay | Admitting: Internal Medicine

## 2017-05-02 ENCOUNTER — Telehealth: Payer: Self-pay | Admitting: Internal Medicine

## 2017-05-02 ENCOUNTER — Ambulatory Visit (HOSPITAL_BASED_OUTPATIENT_CLINIC_OR_DEPARTMENT_OTHER): Payer: Medicare Other | Admitting: Internal Medicine

## 2017-05-02 ENCOUNTER — Other Ambulatory Visit (HOSPITAL_BASED_OUTPATIENT_CLINIC_OR_DEPARTMENT_OTHER): Payer: Medicare Other

## 2017-05-02 ENCOUNTER — Ambulatory Visit (HOSPITAL_BASED_OUTPATIENT_CLINIC_OR_DEPARTMENT_OTHER): Payer: Medicare Other

## 2017-05-02 DIAGNOSIS — C3411 Malignant neoplasm of upper lobe, right bronchus or lung: Secondary | ICD-10-CM

## 2017-05-02 DIAGNOSIS — C7949 Secondary malignant neoplasm of other parts of nervous system: Principal | ICD-10-CM

## 2017-05-02 DIAGNOSIS — F419 Anxiety disorder, unspecified: Secondary | ICD-10-CM

## 2017-05-02 DIAGNOSIS — Z5111 Encounter for antineoplastic chemotherapy: Secondary | ICD-10-CM

## 2017-05-02 DIAGNOSIS — C7931 Secondary malignant neoplasm of brain: Secondary | ICD-10-CM

## 2017-05-02 DIAGNOSIS — F411 Generalized anxiety disorder: Secondary | ICD-10-CM

## 2017-05-02 LAB — CBC WITH DIFFERENTIAL/PLATELET
BASO%: 0 % (ref 0.0–2.0)
Basophils Absolute: 0 10*3/uL (ref 0.0–0.1)
EOS%: 0 % (ref 0.0–7.0)
Eosinophils Absolute: 0 10*3/uL (ref 0.0–0.5)
HCT: 38.8 % (ref 34.8–46.6)
HGB: 12.7 g/dL (ref 11.6–15.9)
LYMPH%: 3.9 % — ABNORMAL LOW (ref 14.0–49.7)
MCH: 33.2 pg (ref 25.1–34.0)
MCHC: 32.7 g/dL (ref 31.5–36.0)
MCV: 101.3 fL — ABNORMAL HIGH (ref 79.5–101.0)
MONO#: 0.5 10*3/uL (ref 0.1–0.9)
MONO%: 6 % (ref 0.0–14.0)
NEUT#: 8.1 10*3/uL — ABNORMAL HIGH (ref 1.5–6.5)
NEUT%: 90.1 % — ABNORMAL HIGH (ref 38.4–76.8)
Platelets: 288 10*3/uL (ref 145–400)
RBC: 3.83 10*6/uL (ref 3.70–5.45)
RDW: 14.6 % — ABNORMAL HIGH (ref 11.2–14.5)
WBC: 9 10*3/uL (ref 3.9–10.3)
lymph#: 0.4 10*3/uL — ABNORMAL LOW (ref 0.9–3.3)

## 2017-05-02 LAB — COMPREHENSIVE METABOLIC PANEL
ALT: 11 U/L (ref 0–55)
AST: 11 U/L (ref 5–34)
Albumin: 3.6 g/dL (ref 3.5–5.0)
Alkaline Phosphatase: 91 U/L (ref 40–150)
Anion Gap: 9 mEq/L (ref 3–11)
BUN: 17.8 mg/dL (ref 7.0–26.0)
CO2: 26 mEq/L (ref 22–29)
Calcium: 9.9 mg/dL (ref 8.4–10.4)
Chloride: 105 mEq/L (ref 98–109)
Creatinine: 0.9 mg/dL (ref 0.6–1.1)
EGFR: 72 mL/min/{1.73_m2} — ABNORMAL LOW (ref >90)
Glucose: 135 mg/dl (ref 70–140)
Potassium: 4.2 mEq/L (ref 3.5–5.1)
Sodium: 140 mEq/L (ref 136–145)
Total Bilirubin: 0.46 mg/dL (ref 0.20–1.20)
Total Protein: 7.2 g/dL (ref 6.4–8.3)

## 2017-05-02 MED ORDER — DEXAMETHASONE SODIUM PHOSPHATE 10 MG/ML IJ SOLN
INTRAMUSCULAR | Status: AC
  Filled 2017-05-02: qty 1

## 2017-05-02 MED ORDER — ALPRAZOLAM 0.25 MG PO TABS: 0.2500 mg | ORAL_TABLET | Freq: Three times a day (TID) | ORAL | 0 refills | Status: DC | PRN

## 2017-05-02 MED ORDER — ONDANSETRON HCL 8 MG PO TABS
8.0000 mg | ORAL_TABLET | Freq: Once | ORAL | Status: AC
  Administered 2017-05-02: 8 mg via ORAL

## 2017-05-02 MED ORDER — DEXAMETHASONE SODIUM PHOSPHATE 10 MG/ML IJ SOLN
10.0000 mg | Freq: Once | INTRAMUSCULAR | Status: AC
  Administered 2017-05-02: 10 mg via INTRAVENOUS

## 2017-05-02 MED ORDER — CYANOCOBALAMIN 1000 MCG/ML IJ SOLN
1000.0000 ug | Freq: Once | INTRAMUSCULAR | Status: AC
  Administered 2017-05-02: 1000 ug via INTRAMUSCULAR

## 2017-05-02 MED ORDER — SODIUM CHLORIDE 0.9 % IV SOLN
Freq: Once | INTRAVENOUS | Status: AC
  Administered 2017-05-02: 10:00:00 via INTRAVENOUS

## 2017-05-02 MED ORDER — SODIUM CHLORIDE 0.9 % IV SOLN
500.0000 mg/m2 | Freq: Once | INTRAVENOUS | Status: AC
  Administered 2017-05-02: 900 mg via INTRAVENOUS
  Filled 2017-05-02: qty 20

## 2017-05-02 MED ORDER — CYANOCOBALAMIN 1000 MCG/ML IJ SOLN
INTRAMUSCULAR | Status: AC
  Filled 2017-05-02: qty 1

## 2017-05-02 MED ORDER — ONDANSETRON HCL 8 MG PO TABS
ORAL_TABLET | ORAL | Status: AC
  Filled 2017-05-02: qty 1

## 2017-05-02 NOTE — Telephone Encounter (Signed)
Gave patient AVS and calendar of upcoming September appointments.

## 2017-05-02 NOTE — Progress Notes (Signed)
[facility] Telephone:([phone]   [phone]  OFFICE PROGRESS NOTE  [name], MD [address] [place] [address]  DIAGNOSIS: Stage IV (T2a, N0, M1b) non-small cell lung cancer, adenocarcinoma with negative EGFR and ALK mutations diagnosed in January 2015 and presented with right upper lobe lung mass in addition to a solitary brain metastasis.  PRIOR THERAPY: 1) Status post stereotactic radiotherapy to a solitary right parietal brain lesion under the care of Dr. [name] on 10/16/2013. 2) Status post palliative radiotherapy to the right lung tumor under the care of Dr. [name] completed on 12/05/2013. 3) Systemic chemotherapy with carboplatin for AUC of 5 and Alimta 500 mg/M2 every 3 weeks. First dose Jan 06 2014. Status post 6 cycles.  CURRENT THERAPY: Systemic chemotherapy with maintenance Alimta 500 MG/M2 every 3 weeks, status post 50 cycles.  INTERVAL HISTORY: Alexis [name] 66 y.o. female returns to the clinic today for follow-up visit accompanied by her husband. The patient is feeling fine today with no specific complaints. She tolerated the last cycle of her treatment with maintenance Alimta fairly well. She denied having any chest pain, shortness of breath, cough or hemoptysis. She denied having any fever or chills. She has no nausea, vomiting, diarrhea or constipation. He is here today for evaluation before starting cycle #51.    MEDICAL HISTORY: Past Medical History:  Diagnosis Date  . Anxiety   . Anxiety 06/20/2016  . Cervical cancer ([place])   . Encounter for antineoplastic chemotherapy 07/20/2015  . 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  MEDICATIONS:  Current Outpatient Prescriptions  Medication Sig Dispense Refill  . acetaminophen (TYLENOL) 500 MG tablet Take 500 mg by mouth every 6 (six) hours as needed for mild pain or  headache. Reported on 11/02/2015    . ALPRAZolam (XANAX) 0.25 MG tablet Take 1 tablet (0.25 mg total) by mouth 3 (three) times daily as needed. for anxiety 45 tablet 0  . Ascorbic Acid (VITAMIN C GUMMIE PO) Take 1 each by mouth every morning.    Marland Kitchen dexamethasone (DECADRON) 4 MG tablet TAKE 1 TABLET BY MOUTH TWICE A DAY THE DAY BEFORE, DAY OF AND DAY AFTER THE CHEMOTHERAPY EVERY 3 WKS 40 tablet 1  . folic acid (FOLVITE) 1 MG tablet TAKE 1 TABLET BY MOUTH EVERY DAY 90 tablet 0  . Multiple Vitamin (MULTIVITAMIN WITH MINERALS) TABS tablet Take 1 tablet by mouth every morning.    Marland Kitchen omeprazole (PRILOSEC) 20 MG capsule Take 1 capsule (20 mg total) by mouth daily. As needed for reflux or indigestion. 42 capsule PRN  . ondansetron (ZOFRAN) 8 MG tablet Take 1 tablet (8 mg total) by mouth every 8 (eight) hours as needed for nausea or vomiting. (Patient not taking: Reported on 03/20/2017) 30 tablet 0  . OVER THE COUNTER MEDICATION Take 1 tablet by mouth every morning. (Vitamin A)    . pentoxifylline (TRENTAL) 400 MG CR tablet TAKE ONE TABLET BY MOUTH TWICE DAILY, ALSO  TAKE  VITAMIN  E  400  UNITS  TWICE  DAILY  WITH  PENTOXIFYLLINE 60 tablet 0  . prochlorperazine (COMPAZINE) 10 MG tablet Take 1 tablet (10 mg total) by mouth every 6 (six) hours as needed for nausea or vomiting. (Patient not taking: Reported on 03/20/2017) 60 tablet 0  . senna-docusate (SENOKOT-S) 8.6-50 MG  tablet Take 1 tablet by mouth daily. 30 tablet prn   No current facility-administered medications for this visit.     SURGICAL HISTORY:  Past Surgical History:  Procedure Laterality Date  . ABDOMINAL HYSTERECTOMY    . COLOSTOMY TAKEDOWN N/A 07/10/2014   Procedure: LAPAROSCOPIC LYSIS OF ADHESIONS (90 MIN) LAPAROSCOPIC ASSISTED COLOSTOMY CLOSURE, RIGID PROCTOSIGMOIDOSCOPY;  Surgeon: Jackolyn Confer, MD;  Location: [facility];  Service: General;  Laterality: N/A;  . LAPAROTOMY N/A 11/03/2013   Procedure: EXPLORATORY LAPAROTOMY, DRAINAGE OF INTRA   ABDOMINAL ABSCESSES, MOBILIZATION OF SPLENIC FLEXURE, SIGMOID COLECTOMY WITH COLOSTOMY;  Surgeon: Odis Hollingshead, MD;  Location: [facility];  Service: General;  Laterality: N/A;  . VIDEO BRONCHOSCOPY Bilateral 08/30/2013   Procedure: VIDEO BRONCHOSCOPY WITH FLUORO;  Surgeon: Tanda Rockers, MD;  Location: [facility];  Service: Cardiopulmonary;  Laterality: Bilateral;    REVIEW OF SYSTEMS:  A comprehensive review of systems was negative.   PHYSICAL EXAMINATION: General appearance: alert, cooperative and no distress Head: Normocephalic, without obvious abnormality, atraumatic Neck: no adenopathy, no JVD, supple, symmetrical, trachea midline and thyroid not enlarged, symmetric, no tenderness/mass/nodules Lymph nodes: Cervical, supraclavicular, and axillary nodes normal. Resp: clear to auscultation bilaterally Back: symmetric, no curvature. ROM normal. No CVA tenderness. Cardio: regular rate and rhythm, S1, S2 normal, no murmur, click, rub or gallop GI: soft, non-tender; bowel sounds normal; no masses,  no organomegaly Extremities: extremities normal, atraumatic, no cyanosis or edema  ECOG PERFORMANCE STATUS: 0 - Asymptomatic  Blood pressure (!) 102/51, pulse 76, temperature 98 F (36.7 C), temperature source Oral, resp. rate 18, weight 167 lb 14.4 oz (76.2 kg), SpO2 100 %.  LABORATORY DATA: Lab Results  Component Value Date   WBC 9.0 05/02/2017   HGB 12.7 05/02/2017   HCT 38.8 05/02/2017   MCV 101.3 (H) 05/02/2017   PLT 288 05/02/2017      Chemistry      Component Value Date/Time   NA 141 04/10/2017 0859   K 4.3 04/10/2017 0859   CL 100 07/12/2014 0512   CO2 26 04/10/2017 0859   BUN 11.2 04/10/2017 0859   CREATININE 0.8 04/10/2017 0859      Component Value Date/Time   CALCIUM 9.6 04/10/2017 0859   ALKPHOS 88 04/10/2017 0859   AST 11 04/10/2017 0859   ALT 13 04/10/2017 0859   BILITOT 0.43 04/10/2017 0859       RADIOGRAPHIC STUDIES: No results found.  ASSESSMENT AND  PLAN:  This is a very pleasant 66 years old white female with metastatic non-small cell lung cancer, adenocarcinoma status post induction systemic chemotherapy with carboplatin and Alimta with partial response. She is currently on maintenance treatment with single agent Alimta status post 50 cycles. The patient tolerated the last cycle of her treatment well with no significant adverse effects. I recommended for her to proceed with cycle #51 today. I will see her back for follow-up visit in 3 weeks for evaluation before cycle #52. For anxiety, I gave the patient refill for Xanax. The patient was advised to call immediately if she has any concerning symptoms in the interval. The patient voices understanding of current disease status and treatment options and is in agreement with the current care plan. All questions were answered. The patient knows to call the clinic with any problems, questions or concerns. We can certainly see the patient much sooner if necessary. I spent 10 minutes counseling the patient face to face. The total time spent in the appointment was 15 minutes. Disclaimer: This  note was dictated with voice recognition software. Similar sounding words can inadvertently be transcribed and may not be corrected upon review.

## 2017-05-02 NOTE — Patient Instructions (Signed)
[facility] Discharge Instructions for Patients Receiving Chemotherapy  Today you received the following chemotherapy agents Alimta  To help prevent nausea and vomiting after your treatment, we encourage you to take your nausea medication as needed   If you develop nausea and vomiting that is not controlled by your nausea medication, call the clinic.   BELOW ARE SYMPTOMS THAT SHOULD BE REPORTED IMMEDIATELY:  *FEVER GREATER THAN 100.5 F  *CHILLS WITH OR WITHOUT FEVER  NAUSEA AND VOMITING THAT IS NOT CONTROLLED WITH YOUR NAUSEA MEDICATION  *UNUSUAL SHORTNESS OF BREATH  *UNUSUAL BRUISING OR BLEEDING  TENDERNESS IN MOUTH AND THROAT WITH OR WITHOUT PRESENCE OF ULCERS  *URINARY PROBLEMS  *BOWEL PROBLEMS  UNUSUAL RASH Items with * indicate a potential emergency and should be followed up as soon as possible.  Feel free to call the clinic you have any questions or concerns. The clinic phone number is [phone].  Please show the [facility] at check-in to the Emergency Department and triage nurse.

## 2017-05-22 ENCOUNTER — Ambulatory Visit (HOSPITAL_BASED_OUTPATIENT_CLINIC_OR_DEPARTMENT_OTHER): Payer: Medicare Other | Admitting: Internal Medicine

## 2017-05-22 ENCOUNTER — Ambulatory Visit (HOSPITAL_BASED_OUTPATIENT_CLINIC_OR_DEPARTMENT_OTHER): Payer: Medicare Other

## 2017-05-22 ENCOUNTER — Telehealth: Payer: Self-pay | Admitting: Internal Medicine

## 2017-05-22 ENCOUNTER — Encounter: Payer: Self-pay | Admitting: Internal Medicine

## 2017-05-22 ENCOUNTER — Other Ambulatory Visit (HOSPITAL_BASED_OUTPATIENT_CLINIC_OR_DEPARTMENT_OTHER): Payer: Medicare Other

## 2017-05-22 VITALS — BP 109/62 | HR 75 | Temp 97.9°F | Resp 17 | Ht 64.0 in | Wt 170.7 lb

## 2017-05-22 DIAGNOSIS — C3411 Malignant neoplasm of upper lobe, right bronchus or lung: Secondary | ICD-10-CM

## 2017-05-22 DIAGNOSIS — C3490 Malignant neoplasm of unspecified part of unspecified bronchus or lung: Secondary | ICD-10-CM

## 2017-05-22 DIAGNOSIS — C7931 Secondary malignant neoplasm of brain: Secondary | ICD-10-CM

## 2017-05-22 DIAGNOSIS — C7949 Secondary malignant neoplasm of other parts of nervous system: Secondary | ICD-10-CM

## 2017-05-22 DIAGNOSIS — R918 Other nonspecific abnormal finding of lung field: Secondary | ICD-10-CM

## 2017-05-22 DIAGNOSIS — Z5111 Encounter for antineoplastic chemotherapy: Secondary | ICD-10-CM | POA: Diagnosis present

## 2017-05-22 DIAGNOSIS — F411 Generalized anxiety disorder: Secondary | ICD-10-CM

## 2017-05-22 DIAGNOSIS — Z23 Encounter for immunization: Secondary | ICD-10-CM

## 2017-05-22 LAB — CBC WITH DIFFERENTIAL/PLATELET
BASO%: 0 % (ref 0.0–2.0)
Basophils Absolute: 0 10*3/uL (ref 0.0–0.1)
EOS%: 0 % (ref 0.0–7.0)
Eosinophils Absolute: 0 10*3/uL (ref 0.0–0.5)
HCT: 36.3 % (ref 34.8–46.6)
HGB: 11.6 g/dL (ref 11.6–15.9)
LYMPH%: 5.1 % — ABNORMAL LOW (ref 14.0–49.7)
MCH: 32.2 pg (ref 25.1–34.0)
MCHC: 32 g/dL (ref 31.5–36.0)
MCV: 100.8 fL (ref 79.5–101.0)
MONO#: 0.4 10*3/uL (ref 0.1–0.9)
MONO%: 4.4 % (ref 0.0–14.0)
NEUT#: 8 10*3/uL — ABNORMAL HIGH (ref 1.5–6.5)
NEUT%: 90.5 % — ABNORMAL HIGH (ref 38.4–76.8)
Platelets: 244 10*3/uL (ref 145–400)
RBC: 3.6 10*6/uL — ABNORMAL LOW (ref 3.70–5.45)
RDW: 14.5 % (ref 11.2–14.5)
WBC: 8.9 10*3/uL (ref 3.9–10.3)
lymph#: 0.5 10*3/uL — ABNORMAL LOW (ref 0.9–3.3)

## 2017-05-22 LAB — COMPREHENSIVE METABOLIC PANEL
ALT: 13 U/L (ref 0–55)
AST: 11 U/L (ref 5–34)
Albumin: 3.4 g/dL — ABNORMAL LOW (ref 3.5–5.0)
Alkaline Phosphatase: 92 U/L (ref 40–150)
Anion Gap: 8 mEq/L (ref 3–11)
BUN: 10.5 mg/dL (ref 7.0–26.0)
CO2: 24 mEq/L (ref 22–29)
Calcium: 9.6 mg/dL (ref 8.4–10.4)
Chloride: 109 mEq/L (ref 98–109)
Creatinine: 0.8 mg/dL (ref 0.6–1.1)
EGFR: 82 mL/min/{1.73_m2} — ABNORMAL LOW (ref >90)
Glucose: 127 mg/dl (ref 70–140)
Potassium: 4 mEq/L (ref 3.5–5.1)
Sodium: 141 mEq/L (ref 136–145)
Total Bilirubin: 0.38 mg/dL (ref 0.20–1.20)
Total Protein: 6.9 g/dL (ref 6.4–8.3)

## 2017-05-22 MED ORDER — INFLUENZA VAC SPLIT QUAD 0.5 ML IM SUSY
0.5000 mL | PREFILLED_SYRINGE | Freq: Once | INTRAMUSCULAR | Status: AC
  Administered 2017-05-22: 0.5 mL via INTRAMUSCULAR
  Filled 2017-05-22: qty 0.5

## 2017-05-22 MED ORDER — SODIUM CHLORIDE 0.9 % IV SOLN
500.0000 mg/m2 | Freq: Once | INTRAVENOUS | Status: AC
  Administered 2017-05-22: 900 mg via INTRAVENOUS
  Filled 2017-05-22: qty 20

## 2017-05-22 MED ORDER — ONDANSETRON HCL 8 MG PO TABS
ORAL_TABLET | ORAL | Status: AC
  Filled 2017-05-22: qty 1

## 2017-05-22 MED ORDER — DEXAMETHASONE SODIUM PHOSPHATE 10 MG/ML IJ SOLN
10.0000 mg | Freq: Once | INTRAMUSCULAR | Status: AC
  Administered 2017-05-22: 10 mg via INTRAVENOUS

## 2017-05-22 MED ORDER — ONDANSETRON HCL 8 MG PO TABS
8.0000 mg | ORAL_TABLET | Freq: Once | ORAL | Status: AC
  Administered 2017-05-22: 8 mg via ORAL

## 2017-05-22 MED ORDER — SODIUM CHLORIDE 0.9 % IV SOLN
Freq: Once | INTRAVENOUS | Status: AC
  Administered 2017-05-22: 10:00:00 via INTRAVENOUS

## 2017-05-22 MED ORDER — DEXAMETHASONE SODIUM PHOSPHATE 10 MG/ML IJ SOLN
INTRAMUSCULAR | Status: AC
  Filled 2017-05-22: qty 1

## 2017-05-22 NOTE — Telephone Encounter (Signed)
Scheduled appt per 9/24 los - Gave patient AVS and calender per los. Central radiology to contact patient with ct schedule.

## 2017-05-22 NOTE — Patient Instructions (Signed)
[facility] Discharge Instructions for Patients Receiving Chemotherapy  Today you received the following chemotherapy agents Alimta.   To help prevent nausea and vomiting after your treatment, we encourage you to take your nausea medication as prescribed.  If you develop nausea and vomiting that is not controlled by your nausea medication, call the clinic.   BELOW ARE SYMPTOMS THAT SHOULD BE REPORTED IMMEDIATELY:  *FEVER GREATER THAN 100.5 F  *CHILLS WITH OR WITHOUT FEVER  NAUSEA AND VOMITING THAT IS NOT CONTROLLED WITH YOUR NAUSEA MEDICATION  *UNUSUAL SHORTNESS OF BREATH  *UNUSUAL BRUISING OR BLEEDING  TENDERNESS IN MOUTH AND THROAT WITH OR WITHOUT PRESENCE OF ULCERS  *URINARY PROBLEMS  *BOWEL PROBLEMS  UNUSUAL RASH Items with * indicate a potential emergency and should be followed up as soon as possible.  Feel free to call the clinic should you have any questions or concerns. The clinic phone number is [phone].  Please show the [facility] at check-in to the Emergency Department and triage nurse.

## 2017-05-22 NOTE — Progress Notes (Signed)
    [facility] [phone]   [phone]  OFFICE PROGRESS NOTE  Little, James, MD [address] [place]  [address]  DIAGNOSIS: Stage IV (T2a, N0, M1b) non-small cell lung cancer, adenocarcinoma with negative EGFR and ALK mutations diagnosed in January 2015 and presented with right upper lobe lung mass in addition to a solitary brain metastasis.  PRIOR THERAPY: 1) Status post stereotactic radiotherapy to a solitary right parietal brain lesion under the care of Dr. Moody on 10/16/2013. 2) Status post palliative radiotherapy to the right lung tumor under the care of Dr. Moody completed on 12/05/2013. 3) Systemic chemotherapy with carboplatin for AUC of 5 and Alimta 500 mg/M2 every 3 weeks. First dose Jan 06 2014. Status post 6 cycles.  CURRENT THERAPY: Systemic chemotherapy with maintenance Alimta 500 MG/M2 every 3 weeks, status post 51 cycles.  INTERVAL HISTORY: Alexis [name] 66 y.o. female returns to the clinic today for follow-up visit accompanied by her husband. The patient is feeling fine today with no specific complaints. She denied having any weight loss or night sweats. She has no nausea, vomiting, diarrhea or constipation. She denied having any fever or chills. She denied having any chest pain, shortness of breath, cough or hemoptysis. She is here today for evaluation before starting cycle #52.  MEDICAL HISTORY: Past Medical History:  Diagnosis Date  . Anxiety   . Anxiety 06/20/2016  . Cervical cancer (HCC)   . Encounter for antineoplastic chemotherapy 07/20/2015  . 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  MEDICATIONS:  Current Outpatient Prescriptions  Medication Sig Dispense Refill  . acetaminophen (TYLENOL) 500 MG tablet Take 500 mg by mouth every 6 (six) hours as needed for mild pain or headache. Reported on 11/02/2015      . ALPRAZolam (XANAX) 0.25 MG tablet Take 1 tablet (0.25 mg total) by mouth 3 (three) times daily as needed. for anxiety 45 tablet 0  . Ascorbic Acid (VITAMIN C GUMMIE PO) Take 1 each by mouth every morning.    . dexamethasone (DECADRON) 4 MG tablet TAKE 1 TABLET BY MOUTH TWICE A DAY THE DAY BEFORE, DAY OF AND DAY AFTER THE CHEMOTHERAPY EVERY 3 WKS 40 tablet 1  . folic acid (FOLVITE) 1 MG tablet TAKE 1 TABLET BY MOUTH EVERY DAY 90 tablet 0  . Multiple Vitamin (MULTIVITAMIN WITH MINERALS) TABS tablet Take 1 tablet by mouth every morning.    . omeprazole (PRILOSEC) 20 MG capsule Take 1 capsule (20 mg total) by mouth daily. As needed for reflux or indigestion. 42 capsule PRN  . ondansetron (ZOFRAN) 8 MG tablet Take 1 tablet (8 mg total) by mouth every 8 (eight) hours as needed for nausea or vomiting. 30 tablet 0  . OVER THE COUNTER MEDICATION Take 1 tablet by mouth every morning. (Vitamin A)    . pentoxifylline (TRENTAL) 400 MG CR tablet TAKE ONE TABLET BY MOUTH TWICE DAILY, ALSO  TAKE  VITAMIN  E  400  UNITS  TWICE  DAILY  WITH  PENTOXIFYLLINE 60 tablet 0  . prochlorperazine (COMPAZINE) 10 MG tablet Take 1 tablet (10 mg total) by mouth every 6 (six) hours as needed for nausea or vomiting. 60 tablet 0  . senna-docusate (SENOKOT-S) 8.6-50 MG tablet Take 1 tablet by mouth daily. 30 tablet prn   No current facility-administered medications for   this visit.     SURGICAL HISTORY:  Past Surgical History:  Procedure Laterality Date  . ABDOMINAL HYSTERECTOMY    . COLOSTOMY TAKEDOWN N/A 07/10/2014   Procedure: LAPAROSCOPIC LYSIS OF ADHESIONS (90 MIN) LAPAROSCOPIC ASSISTED COLOSTOMY CLOSURE, RIGID PROCTOSIGMOIDOSCOPY;  Surgeon: Todd Rosenbower, MD;  Location: [facility];  Service: General;  Laterality: N/A;  . LAPAROTOMY N/A 11/03/2013   Procedure: EXPLORATORY LAPAROTOMY, DRAINAGE OF INTRA  ABDOMINAL ABSCESSES, MOBILIZATION OF SPLENIC FLEXURE, SIGMOID COLECTOMY WITH COLOSTOMY;  Surgeon: Todd J Rosenbower, MD;   Location: [facility];  Service: General;  Laterality: N/A;  . VIDEO BRONCHOSCOPY Bilateral 08/30/2013   Procedure: VIDEO BRONCHOSCOPY WITH FLUORO;  Surgeon: Michael B Wert, MD;  Location: [facility];  Service: Cardiopulmonary;  Laterality: Bilateral;    REVIEW OF SYSTEMS:  A comprehensive review of systems was negative.   PHYSICAL EXAMINATION: General appearance: alert, cooperative and no distress Head: Normocephalic, without obvious abnormality, atraumatic Neck: no adenopathy, no JVD, supple, symmetrical, trachea midline and thyroid not enlarged, symmetric, no tenderness/mass/nodules Lymph nodes: Cervical, supraclavicular, and axillary nodes normal. Resp: clear to auscultation bilaterally Back: symmetric, no curvature. ROM normal. No CVA tenderness. Cardio: regular rate and rhythm, S1, S2 normal, no murmur, click, rub or gallop GI: soft, non-tender; bowel sounds normal; no masses,  no organomegaly Extremities: extremities normal, atraumatic, no cyanosis or edema  ECOG PERFORMANCE STATUS: 0 - Asymptomatic  Blood pressure 109/62, pulse 75, temperature 97.9 F (36.6 C), temperature source Oral, resp. rate 17, height 5' 4" (1.626 m), weight 170 lb 11.2 oz (77.4 kg), SpO2 100 %.  LABORATORY DATA: Lab Results  Component Value Date   WBC 8.9 05/22/2017   HGB 11.6 05/22/2017   HCT 36.3 05/22/2017   MCV 100.8 05/22/2017   PLT 244 05/22/2017      Chemistry      Component Value Date/Time   NA 140 05/02/2017 0843   K 4.2 05/02/2017 0843   CL 100 07/12/2014 0512   CO2 26 05/02/2017 0843   BUN 17.8 05/02/2017 0843   CREATININE 0.9 05/02/2017 0843      Component Value Date/Time   CALCIUM 9.9 05/02/2017 0843   ALKPHOS 91 05/02/2017 0843   AST 11 05/02/2017 0843   ALT 11 05/02/2017 0843   BILITOT 0.46 05/02/2017 0843       RADIOGRAPHIC STUDIES: No results found.  ASSESSMENT AND PLAN:  This is a very pleasant 66 years old white female with metastatic non-small cell lung cancer,  adenocarcinoma status post induction systemic chemotherapy with carboplatin and Alimta with partial response. She is currently on maintenance treatment with single agent Alimta status post 51 cycles. The patient continues to tolerate her treatment fairly well with no complaints. I recommended for her to proceed with cycle #52 today as a scheduled. I will see her back for follow-up visit in 3 weeks for evaluation after repeating CT scan of the chest, abdomen and pelvis for restaging of her disease. The patient voices understanding of current disease status and treatment options and is in agreement with the current care plan. All questions were answered. The patient knows to call the clinic with any problems, questions or concerns. We can certainly see the patient much sooner if necessary. I spent 10 minutes counseling the patient face to face. The total time spent in the appointment was 15 minutes. Disclaimer: This note was dictated with voice recognition software. Similar sounding words can inadvertently be transcribed and may not be corrected upon review.       

## 2017-05-30 ENCOUNTER — Other Ambulatory Visit: Payer: Self-pay | Admitting: Radiation Oncology

## 2017-06-05 ENCOUNTER — Other Ambulatory Visit: Payer: Self-pay | Admitting: Radiation Therapy

## 2017-06-05 ENCOUNTER — Telehealth: Payer: Self-pay | Admitting: *Deleted

## 2017-06-05 DIAGNOSIS — C7931 Secondary malignant neoplasm of brain: Secondary | ICD-10-CM

## 2017-06-05 DIAGNOSIS — C7949 Secondary malignant neoplasm of other parts of nervous system: Principal | ICD-10-CM

## 2017-06-05 NOTE — Telephone Encounter (Signed)
Called [facility] [phone] ,per Dr. [name] to refill patient's trental 400mg  cr tablet 1 tab bid with vitamin E 400mg  bid, dispense 180 tablet with 3 refills, MD e-scribed this medication on 06/02/17, called and left message requesting to call patient if not already  when medication is ready 7:39 AM

## 2017-06-09 ENCOUNTER — Ambulatory Visit (HOSPITAL_COMMUNITY)
Admission: RE | Admit: 2017-06-09 | Discharge: 2017-06-09 | Disposition: A | Discharge status: Home / Self Care | Payer: Medicare Other | Source: Ambulatory Visit | Attending: Internal Medicine | Admitting: Internal Medicine

## 2017-06-09 ENCOUNTER — Encounter (HOSPITAL_COMMUNITY): Payer: Self-pay

## 2017-06-09 DIAGNOSIS — C3490 Malignant neoplasm of unspecified part of unspecified bronchus or lung: Secondary | ICD-10-CM

## 2017-06-09 DIAGNOSIS — I700 Atherosclerosis of aorta: Secondary | ICD-10-CM | POA: Insufficient documentation

## 2017-06-09 DIAGNOSIS — C7949 Secondary malignant neoplasm of other parts of nervous system: Secondary | ICD-10-CM | POA: Insufficient documentation

## 2017-06-09 DIAGNOSIS — J430 Unilateral pulmonary emphysema [MacLeod's syndrome]: Secondary | ICD-10-CM | POA: Diagnosis not present

## 2017-06-09 DIAGNOSIS — C3491 Malignant neoplasm of unspecified part of right bronchus or lung: Secondary | ICD-10-CM | POA: Diagnosis not present

## 2017-06-09 DIAGNOSIS — Z5111 Encounter for antineoplastic chemotherapy: Secondary | ICD-10-CM | POA: Insufficient documentation

## 2017-06-09 DIAGNOSIS — C3411 Malignant neoplasm of upper lobe, right bronchus or lung: Secondary | ICD-10-CM | POA: Insufficient documentation

## 2017-06-09 DIAGNOSIS — C7931 Secondary malignant neoplasm of brain: Secondary | ICD-10-CM | POA: Insufficient documentation

## 2017-06-09 DIAGNOSIS — J432 Centrilobular emphysema: Secondary | ICD-10-CM | POA: Insufficient documentation

## 2017-06-09 MED ORDER — IOPAMIDOL (ISOVUE-300) INJECTION 61%
INTRAVENOUS | Status: AC
  Filled 2017-06-09: qty 100

## 2017-06-09 MED ORDER — IOPAMIDOL (ISOVUE-300) INJECTION 61%
100.0000 mL | Freq: Once | INTRAVENOUS | Status: AC | PRN
  Administered 2017-06-09: 100 mL via INTRAVENOUS

## 2017-06-12 ENCOUNTER — Ambulatory Visit (HOSPITAL_BASED_OUTPATIENT_CLINIC_OR_DEPARTMENT_OTHER): Payer: Medicare Other

## 2017-06-12 ENCOUNTER — Encounter: Payer: Self-pay | Admitting: Internal Medicine

## 2017-06-12 ENCOUNTER — Telehealth: Payer: Self-pay

## 2017-06-12 ENCOUNTER — Ambulatory Visit (HOSPITAL_BASED_OUTPATIENT_CLINIC_OR_DEPARTMENT_OTHER): Payer: PRIVATE HEALTH INSURANCE | Admitting: Internal Medicine

## 2017-06-12 ENCOUNTER — Other Ambulatory Visit (HOSPITAL_BASED_OUTPATIENT_CLINIC_OR_DEPARTMENT_OTHER): Payer: Medicare Other

## 2017-06-12 DIAGNOSIS — C7931 Secondary malignant neoplasm of brain: Secondary | ICD-10-CM

## 2017-06-12 DIAGNOSIS — C3411 Malignant neoplasm of upper lobe, right bronchus or lung: Secondary | ICD-10-CM

## 2017-06-12 DIAGNOSIS — Z5111 Encounter for antineoplastic chemotherapy: Secondary | ICD-10-CM

## 2017-06-12 DIAGNOSIS — C7949 Secondary malignant neoplasm of other parts of nervous system: Principal | ICD-10-CM

## 2017-06-12 DIAGNOSIS — F411 Generalized anxiety disorder: Secondary | ICD-10-CM

## 2017-06-12 DIAGNOSIS — C3490 Malignant neoplasm of unspecified part of unspecified bronchus or lung: Secondary | ICD-10-CM

## 2017-06-12 LAB — CBC WITH DIFFERENTIAL/PLATELET
BASO%: 0 % (ref 0.0–2.0)
Basophils Absolute: 0 10*3/uL (ref 0.0–0.1)
EOS%: 0 % (ref 0.0–7.0)
Eosinophils Absolute: 0 10*3/uL (ref 0.0–0.5)
HCT: 37.8 % (ref 34.8–46.6)
HGB: 11.9 g/dL (ref 11.6–15.9)
LYMPH%: 4.4 % — ABNORMAL LOW (ref 14.0–49.7)
MCH: 32.2 pg (ref 25.1–34.0)
MCHC: 31.5 g/dL (ref 31.5–36.0)
MCV: 102.4 fL — ABNORMAL HIGH (ref 79.5–101.0)
MONO#: 0.9 10*3/uL (ref 0.1–0.9)
MONO%: 10.2 % (ref 0.0–14.0)
NEUT#: 7.7 10*3/uL — ABNORMAL HIGH (ref 1.5–6.5)
NEUT%: 85.4 % — ABNORMAL HIGH (ref 38.4–76.8)
Platelets: 270 10*3/uL (ref 145–400)
RBC: 3.69 10*6/uL — ABNORMAL LOW (ref 3.70–5.45)
RDW: 14.9 % — ABNORMAL HIGH (ref 11.2–14.5)
WBC: 9 10*3/uL (ref 3.9–10.3)
lymph#: 0.4 10*3/uL — ABNORMAL LOW (ref 0.9–3.3)

## 2017-06-12 LAB — COMPREHENSIVE METABOLIC PANEL
ALT: 14 U/L (ref 0–55)
AST: 12 U/L (ref 5–34)
Albumin: 3.5 g/dL (ref 3.5–5.0)
Alkaline Phosphatase: 87 U/L (ref 40–150)
Anion Gap: 9 mEq/L (ref 3–11)
BUN: 11.3 mg/dL (ref 7.0–26.0)
CO2: 24 mEq/L (ref 22–29)
Calcium: 9.4 mg/dL (ref 8.4–10.4)
Chloride: 107 mEq/L (ref 98–109)
Creatinine: 0.8 mg/dL (ref 0.6–1.1)
EGFR: >60 mL/min/{1.73_m2} (ref >60)
Glucose: 119 mg/dl (ref 70–140)
Potassium: 4.2 mEq/L (ref 3.5–5.1)
Sodium: 141 mEq/L (ref 136–145)
Total Bilirubin: 0.37 mg/dL (ref 0.20–1.20)
Total Protein: 6.9 g/dL (ref 6.4–8.3)

## 2017-06-12 MED ORDER — ONDANSETRON HCL 8 MG PO TABS
8.0000 mg | ORAL_TABLET | Freq: Once | ORAL | Status: AC
  Administered 2017-06-12: 8 mg via ORAL

## 2017-06-12 MED ORDER — DEXAMETHASONE SODIUM PHOSPHATE 10 MG/ML IJ SOLN
10.0000 mg | Freq: Once | INTRAMUSCULAR | Status: AC
  Administered 2017-06-12: 10 mg via INTRAVENOUS

## 2017-06-12 MED ORDER — PEMETREXED DISODIUM CHEMO INJECTION 500 MG
500.0000 mg/m2 | Freq: Once | INTRAVENOUS | Status: AC
  Administered 2017-06-12: 900 mg via INTRAVENOUS
  Filled 2017-06-12: qty 20

## 2017-06-12 MED ORDER — ALPRAZOLAM 0.25 MG PO TABS: 0.2500 mg | ORAL_TABLET | Freq: Three times a day (TID) | ORAL | 0 refills | Status: DC | PRN

## 2017-06-12 MED ORDER — ONDANSETRON HCL 8 MG PO TABS
ORAL_TABLET | ORAL | Status: AC
  Filled 2017-06-12: qty 1

## 2017-06-12 MED ORDER — DEXAMETHASONE SODIUM PHOSPHATE 10 MG/ML IJ SOLN
INTRAMUSCULAR | Status: AC
  Filled 2017-06-12: qty 1

## 2017-06-12 MED ORDER — SODIUM CHLORIDE 0.9 % IV SOLN
Freq: Once | INTRAVENOUS | Status: AC
  Administered 2017-06-12: 10:00:00 via INTRAVENOUS

## 2017-06-12 NOTE — Patient Instructions (Signed)
Alexis [name] Discharge Instructions for Patients Receiving Chemotherapy  Today you received the following chemotherapy agents Alimta   To help prevent nausea and vomiting after your treatment, we encourage you to take your nausea medication as directed.    If you develop nausea and vomiting that is not controlled by your nausea medication, call the clinic.   BELOW ARE SYMPTOMS THAT SHOULD BE REPORTED IMMEDIATELY:  *FEVER GREATER THAN 100.5 F  *CHILLS WITH OR WITHOUT FEVER  NAUSEA AND VOMITING THAT IS NOT CONTROLLED WITH YOUR NAUSEA MEDICATION  *UNUSUAL SHORTNESS OF BREATH  *UNUSUAL BRUISING OR BLEEDING  TENDERNESS IN MOUTH AND THROAT WITH OR WITHOUT PRESENCE OF ULCERS  *URINARY PROBLEMS  *BOWEL PROBLEMS  UNUSUAL RASH Items with * indicate a potential emergency and should be followed up as soon as possible.  Feel free to call the clinic should you have any questions or concerns. The clinic phone number is [phone].  Please show the [name] at check-in to the Emergency Department and triage nurse.

## 2017-06-12 NOTE — Telephone Encounter (Signed)
Printed avs and calender for upcoming appointment. Per 10/15 los

## 2017-06-12 NOTE — Progress Notes (Signed)
[facility] [phone]   [phone]  OFFICE PROGRESS NOTE  [name], MD [address] [place] [address]  DIAGNOSIS: Stage IV (T2a, N0, M1b) non-small cell lung cancer, adenocarcinoma with negative EGFR and ALK mutations diagnosed in January 2015 and presented with right upper lobe lung mass in addition to a solitary brain metastasis.  PRIOR THERAPY: 1) Status post stereotactic radiotherapy to a solitary right parietal brain lesion under the care of Dr. [name] on 10/16/2013. 2) Status post palliative radiotherapy to the right lung tumor under the care of Dr. [name] completed on 12/05/2013. 3) Systemic chemotherapy with carboplatin for AUC of 5 and Alimta 500 mg/M2 every 3 weeks. First dose Jan 06 2014. Status post 6 cycles.  CURRENT THERAPY: Systemic chemotherapy with maintenance Alimta 500 MG/M2 every 3 weeks, status post 52 cycles.  INTERVAL HISTORY: Alexis [name] 66 y.o. female returns to the clinic today for follow-up visit accompanied by her husband. The patient is feeling fine today with no specific complaints. She continues to tolerate her maintenance treatment fairly well with no significant adverse effects. She denied having any chest pain, shortness of breath, cough or hemoptysis. She denied having any fever or chills.the patient has no weight loss or night sweats. She has no nausea, vomiting, diarrhea or constipation. The patient had repeat CT scan of the chest , abdomen and pelvis performed recently and she is here today for evaluation and discussion of her scan results.   MEDICAL HISTORY: Past Medical History:  Diagnosis Date  . Anxiety   . Anxiety 06/20/2016  . Cervical cancer ([place])   . Encounter for antineoplastic chemotherapy 07/20/2015  . 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  MEDICATIONS:  Current  Outpatient Prescriptions  Medication Sig Dispense Refill  . acetaminophen (TYLENOL) 500 MG tablet Take 500 mg by mouth every 6 (six) hours as needed for mild pain or headache. Reported on 11/02/2015    . ALPRAZolam (XANAX) 0.25 MG tablet Take 1 tablet (0.25 mg total) by mouth 3 (three) times daily as needed. for anxiety 45 tablet 0  . Ascorbic Acid (VITAMIN C GUMMIE PO) Take 1 each by mouth every morning.    Marland Kitchen dexamethasone (DECADRON) 4 MG tablet TAKE 1 TABLET BY MOUTH TWICE A DAY THE DAY BEFORE, DAY OF AND DAY AFTER THE CHEMOTHERAPY EVERY 3 WKS 40 tablet 1  . folic acid (FOLVITE) 1 MG tablet TAKE 1 TABLET BY MOUTH EVERY DAY 90 tablet 0  . Multiple Vitamin (MULTIVITAMIN WITH MINERALS) TABS tablet Take 1 tablet by mouth every morning.    Marland Kitchen omeprazole (PRILOSEC) 20 MG capsule Take 1 capsule (20 mg total) by mouth daily. As needed for reflux or indigestion. 42 capsule PRN  . ondansetron (ZOFRAN) 8 MG tablet Take 1 tablet (8 mg total) by mouth every 8 (eight) hours as needed for nausea or vomiting. 30 tablet 0  . OVER THE COUNTER MEDICATION Take 1 tablet by mouth every morning. (Vitamin A)    . pentoxifylline (TRENTAL) 400 MG CR tablet TAKE 1 TABLET BY MOUTH TWICE A DAY (TAKE WITH VITAMIN E 400MG TWICE A DAY) 180 tablet 3  . prochlorperazine (COMPAZINE) 10 MG tablet Take 1 tablet (10 mg total) by mouth every 6 (six) hours as needed for nausea or vomiting. 60 tablet 0  .  senna-docusate (SENOKOT-S) 8.6-50 MG tablet Take 1 tablet by mouth daily. 30 tablet prn   No current facility-administered medications for this visit.     SURGICAL HISTORY:  Past Surgical History:  Procedure Laterality Date  . ABDOMINAL HYSTERECTOMY    . COLOSTOMY TAKEDOWN N/A 07/10/2014   Procedure: LAPAROSCOPIC LYSIS OF ADHESIONS (90 MIN) LAPAROSCOPIC ASSISTED COLOSTOMY CLOSURE, RIGID PROCTOSIGMOIDOSCOPY;  Surgeon: Jackolyn Confer, MD;  Location: [facility];  Service: General;  Laterality: N/A;  . LAPAROTOMY N/A 11/03/2013   Procedure:  EXPLORATORY LAPAROTOMY, DRAINAGE OF INTRA  ABDOMINAL ABSCESSES, MOBILIZATION OF SPLENIC FLEXURE, SIGMOID COLECTOMY WITH COLOSTOMY;  Surgeon: Odis Hollingshead, MD;  Location: [facility];  Service: General;  Laterality: N/A;  . VIDEO BRONCHOSCOPY Bilateral 08/30/2013   Procedure: VIDEO BRONCHOSCOPY WITH FLUORO;  Surgeon: Tanda Rockers, MD;  Location: [facility];  Service: Cardiopulmonary;  Laterality: Bilateral;    REVIEW OF SYSTEMS:  Constitutional: negative Eyes: negative Ears, nose, mouth, throat, and face: negative Respiratory: negative Cardiovascular: negative Gastrointestinal: negative Genitourinary:negative Integument/breast: negative Hematologic/lymphatic: negative Musculoskeletal:negative Neurological: negative Behavioral/Psych: negative Endocrine: negative Allergic/Immunologic: negative   PHYSICAL EXAMINATION: General appearance: alert, cooperative and no distress Head: Normocephalic, without obvious abnormality, atraumatic Neck: no adenopathy, no JVD, supple, symmetrical, trachea midline and thyroid not enlarged, symmetric, no tenderness/mass/nodules Lymph nodes: Cervical, supraclavicular, and axillary nodes normal. Resp: clear to auscultation bilaterally Back: symmetric, no curvature. ROM normal. No CVA tenderness. Cardio: regular rate and rhythm, S1, S2 normal, no murmur, click, rub or gallop GI: soft, non-tender; bowel sounds normal; no masses,  no organomegaly Extremities: extremities normal, atraumatic, no cyanosis or edema Neurologic: Alert and oriented X 3, normal strength and tone. Normal symmetric reflexes. Normal coordination and gait  ECOG PERFORMANCE STATUS: 0 - Asymptomatic  Blood pressure (!) 123/50, pulse 86, temperature 98 F (36.7 C), temperature source Oral, resp. rate 18, height 5' 4" (1.626 m), weight 171 lb (77.6 kg), SpO2 100 %.  LABORATORY DATA: Lab Results  Component Value Date   WBC 9.0 06/12/2017   HGB 11.9 06/12/2017   HCT 37.8 06/12/2017    MCV 102.4 (H) 06/12/2017   PLT 270 06/12/2017      Chemistry      Component Value Date/Time   NA 141 05/22/2017 0822   K 4.0 05/22/2017 0822   CL 100 07/12/2014 0512   CO2 24 05/22/2017 0822   BUN 10.5 05/22/2017 0822   CREATININE 0.8 05/22/2017 0822      Component Value Date/Time   CALCIUM 9.6 05/22/2017 0822   ALKPHOS 92 05/22/2017 0822   AST 11 05/22/2017 0822   ALT 13 05/22/2017 0822   BILITOT 0.38 05/22/2017 0822       RADIOGRAPHIC STUDIES: Ct Chest W Contrast  Result Date: 06/09/2017 CLINICAL DATA:  Metastatic right lung cancer restaging. EXAM: CT CHEST, ABDOMEN, AND PELVIS WITH CONTRAST TECHNIQUE: Multidetector CT imaging of the chest, abdomen and pelvis was performed following the standard protocol during bolus administration of intravenous contrast. CONTRAST:  140m ISOVUE-300 IOPAMIDOL (ISOVUE-300) INJECTION 61% COMPARISON:  03/15/2017 FINDINGS: CT CHEST FINDINGS Cardiovascular: Aortic and branch vessel atherosclerotic calcification. Mediastinum/Nodes: Right hilar lymph node 0.7 cm in short axis on image 26/2, formerly 0.6 cm. Left paratracheal node 0.8 cm in short axis, previously 0.6 cm. No overtly pathologically enlarged thoracic lymph nodes. Lungs/Pleura: Reduced size of the right apical cavitary structure or fluid-filled bulla, 1.5 cm on image 24/4, formerly 2.6 cm. Considerable right apical and right upper paramediastinal opacity partially extending into the superior segment right upper  lobe, likely related to prior radiation port. Airway thickening is present, suggesting bronchitis or reactive airways disease. Paraseptal and centrilobular emphysema. Left apical pleuroparenchymal scarring. Mild scarring along the lower margin of the left major fissure. Musculoskeletal: Thoracic spondylosis. CT ABDOMEN PELVIS FINDINGS Hepatobiliary: Unremarkable Pancreas: Unremarkable Spleen: Unremarkable Adrenals/Urinary Tract: Unremarkable Stomach/Bowel: Prior partial colectomy with  rectosigmoid anastomosis. Vascular/Lymphatic: Aortoiliac atherosclerotic vascular disease. Reproductive: Uterus absent.  Adnexa unremarkable. Other: No supplemental non-categorized findings. Musculoskeletal: Unremarkable IMPRESSION: 1. Reduced size of the fluid-filled bulla at the right lung apex, with right apical scarring and findings of prior radiation therapy. No increase in prominence of the densities in this vicinity to suggest active tumor. No findings of distant metastatic disease. 2. Other imaging findings of potential clinical significance: Aortic Atherosclerosis (ICD10-I70.0) and Emphysema (ICD10-J43.9). Airway thickening is present, suggesting bronchitis or reactive airways disease. Electronically Signed   By: Van Clines M.D.   On: 06/09/2017 10:24   Ct Abdomen Pelvis W Contrast  Result Date: 06/09/2017 CLINICAL DATA:  Metastatic right lung cancer restaging. EXAM: CT CHEST, ABDOMEN, AND PELVIS WITH CONTRAST TECHNIQUE: Multidetector CT imaging of the chest, abdomen and pelvis was performed following the standard protocol during bolus administration of intravenous contrast. CONTRAST:  141m ISOVUE-300 IOPAMIDOL (ISOVUE-300) INJECTION 61% COMPARISON:  03/15/2017 FINDINGS: CT CHEST FINDINGS Cardiovascular: Aortic and branch vessel atherosclerotic calcification. Mediastinum/Nodes: Right hilar lymph node 0.7 cm in short axis on image 26/2, formerly 0.6 cm. Left paratracheal node 0.8 cm in short axis, previously 0.6 cm. No overtly pathologically enlarged thoracic lymph nodes. Lungs/Pleura: Reduced size of the right apical cavitary structure or fluid-filled bulla, 1.5 cm on image 24/4, formerly 2.6 cm. Considerable right apical and right upper paramediastinal opacity partially extending into the superior segment right upper lobe, likely related to prior radiation port. Airway thickening is present, suggesting bronchitis or reactive airways disease. Paraseptal and centrilobular emphysema. Left apical  pleuroparenchymal scarring. Mild scarring along the lower margin of the left major fissure. Musculoskeletal: Thoracic spondylosis. CT ABDOMEN PELVIS FINDINGS Hepatobiliary: Unremarkable Pancreas: Unremarkable Spleen: Unremarkable Adrenals/Urinary Tract: Unremarkable Stomach/Bowel: Prior partial colectomy with rectosigmoid anastomosis. Vascular/Lymphatic: Aortoiliac atherosclerotic vascular disease. Reproductive: Uterus absent.  Adnexa unremarkable. Other: No supplemental non-categorized findings. Musculoskeletal: Unremarkable IMPRESSION: 1. Reduced size of the fluid-filled bulla at the right lung apex, with right apical scarring and findings of prior radiation therapy. No increase in prominence of the densities in this vicinity to suggest active tumor. No findings of distant metastatic disease. 2. Other imaging findings of potential clinical significance: Aortic Atherosclerosis (ICD10-I70.0) and Emphysema (ICD10-J43.9). Airway thickening is present, suggesting bronchitis or reactive airways disease. Electronically Signed   By: WVan ClinesM.D.   On: 06/09/2017 10:24    ASSESSMENT AND PLAN:  This is a very pleasant 67years old white female with metastatic non-small cell lung cancer, adenocarcinoma status post induction systemic chemotherapy with carboplatin and Alimta with partial response. She is currently on maintenance treatment with single agent Alimta status post 52 cycles. The patient tolerated the last cycle of her treatment fairly well with no significant adverse effects. She had repeat CT scan of the chest performed recently. I personally and independently reviewed the scan images and discuss the results with the patient and her husband. I recommended for her to continue her current treatment with Alimta and she will start cycle #53 today. I will see her back for follow-up visit in 3 weeks for evaluation before the next cycle of her treatment. The patient was advised to call immediately if  she has any concerning symptoms in the interval. The patient voices understanding of current disease status and treatment options and is in agreement with the current care plan. All questions were answered. The patient knows to call the clinic with any problems, questions or concerns. We can certainly see the patient much sooner if necessary.  Disclaimer: This note was dictated with voice recognition software. Similar sounding words can inadvertently be transcribed and may not be corrected upon review.

## 2017-06-29 ENCOUNTER — Ambulatory Visit
Admission: RE | Admit: 2017-06-29 | Discharge: 2017-06-29 | Disposition: A | Discharge status: Home / Self Care | Payer: Medicare Other | Source: Ambulatory Visit | Attending: Radiation Oncology | Admitting: Radiation Oncology

## 2017-06-29 ENCOUNTER — Other Ambulatory Visit: Payer: Medicare Other

## 2017-06-29 DIAGNOSIS — C7949 Secondary malignant neoplasm of other parts of nervous system: Principal | ICD-10-CM

## 2017-06-29 DIAGNOSIS — C713 Malignant neoplasm of parietal lobe: Secondary | ICD-10-CM | POA: Diagnosis not present

## 2017-06-29 DIAGNOSIS — C7931 Secondary malignant neoplasm of brain: Secondary | ICD-10-CM

## 2017-06-29 MED ORDER — GADOBENATE DIMEGLUMINE 529 MG/ML IV SOLN: 15.0000 mL | Freq: Once | INTRAVENOUS | Status: DC | PRN

## 2017-06-29 MED ORDER — GADOBENATE DIMEGLUMINE 529 MG/ML IV SOLN
15.0000 mL | Freq: Once | INTRAVENOUS | Status: AC | PRN
  Administered 2017-06-29: 15 mL via INTRAVENOUS

## 2017-06-29 NOTE — Progress Notes (Addendum)
Alexis [name] 66 y.o. woman with Metastatic non-small cell lung cancer with a solitary brain metastasis,review 06-29-17 MRI w wo contrast,FU.  PAIN:  She is currently is not having pain. NEURO:Alert and oriented x 3 with fluent speech,able to complete sentences without difficulty with word finding or organization of sentences. Reports having no any visual disturbance, had bilateral cataract extractions. Reports occassional headache takes Tylenol. Imaging:06-29-17 MRI brain w wo contrast Lab:07-03-17 Cmet and CBC w diff 06-12-17 Saw Dr. Julien Nordmann recommended for her to continue her current treatment with Alimta and she will start cycle #53 today. I will see her back for follow-up visit in 3 weeks for evaluation before the next cycle of her treatment. Taking 4mg  tablet of Prednisone day before and day after her chemotherapy next chemotherapy due today.  No signs of  thrush. Wt Readings from Last 3 Encounters:  07/03/17 170 lb 3.2 oz (77.2 kg)  06/12/17 171 lb (77.6 kg)  05/22/17 170 lb 11.2 oz (77.4 kg)  BP 116/79   Pulse 91   Temp 97.8 F (36.6 C) (Oral)   Resp 18   Ht 5\' 4"  (1.626 m)   Wt 170 lb 3.2 oz (77.2 kg)   SpO2 100%   BMI 29.21 kg/m

## 2017-07-03 ENCOUNTER — Encounter: Payer: Self-pay | Admitting: Radiation Oncology

## 2017-07-03 ENCOUNTER — Encounter: Payer: Self-pay | Admitting: Internal Medicine

## 2017-07-03 ENCOUNTER — Other Ambulatory Visit (HOSPITAL_BASED_OUTPATIENT_CLINIC_OR_DEPARTMENT_OTHER): Payer: Medicare Other

## 2017-07-03 ENCOUNTER — Ambulatory Visit
Admission: RE | Admit: 2017-07-03 | Discharge: 2017-07-03 | Disposition: A | Discharge status: Home / Self Care | Payer: Medicare Other | Source: Ambulatory Visit | Attending: Radiation Oncology | Admitting: Radiation Oncology

## 2017-07-03 ENCOUNTER — Ambulatory Visit (HOSPITAL_BASED_OUTPATIENT_CLINIC_OR_DEPARTMENT_OTHER): Payer: Medicare Other

## 2017-07-03 ENCOUNTER — Ambulatory Visit (HOSPITAL_BASED_OUTPATIENT_CLINIC_OR_DEPARTMENT_OTHER): Payer: Medicare Other | Admitting: Internal Medicine

## 2017-07-03 ENCOUNTER — Encounter: Payer: Self-pay | Admitting: *Deleted

## 2017-07-03 VITALS — BP 116/79 | HR 91 | Temp 97.8°F | Resp 18 | Ht 64.0 in | Wt 170.0 lb

## 2017-07-03 VITALS — BP 116/79 | HR 91 | Temp 97.8°F | Resp 18 | Ht 64.0 in | Wt 170.2 lb

## 2017-07-03 DIAGNOSIS — Z90710 Acquired absence of both cervix and uterus: Secondary | ICD-10-CM | POA: Diagnosis not present

## 2017-07-03 DIAGNOSIS — Z87891 Personal history of nicotine dependence: Secondary | ICD-10-CM | POA: Diagnosis not present

## 2017-07-03 DIAGNOSIS — Z08 Encounter for follow-up examination after completed treatment for malignant neoplasm: Secondary | ICD-10-CM | POA: Diagnosis not present

## 2017-07-03 DIAGNOSIS — Z79899 Other long term (current) drug therapy: Secondary | ICD-10-CM | POA: Diagnosis not present

## 2017-07-03 DIAGNOSIS — F419 Anxiety disorder, unspecified: Secondary | ICD-10-CM | POA: Insufficient documentation

## 2017-07-03 DIAGNOSIS — Z885 Allergy status to narcotic agent status: Secondary | ICD-10-CM | POA: Insufficient documentation

## 2017-07-03 DIAGNOSIS — C7949 Secondary malignant neoplasm of other parts of nervous system: Secondary | ICD-10-CM

## 2017-07-03 DIAGNOSIS — C3411 Malignant neoplasm of upper lobe, right bronchus or lung: Secondary | ICD-10-CM

## 2017-07-03 DIAGNOSIS — Y842 Radiological procedure and radiotherapy as the cause of abnormal reaction of the patient, or of later complication, without mention of misadventure at the time of the procedure: Secondary | ICD-10-CM | POA: Insufficient documentation

## 2017-07-03 DIAGNOSIS — Z923 Personal history of irradiation: Secondary | ICD-10-CM | POA: Diagnosis not present

## 2017-07-03 DIAGNOSIS — Z5111 Encounter for antineoplastic chemotherapy: Secondary | ICD-10-CM | POA: Diagnosis present

## 2017-07-03 DIAGNOSIS — L598 Other specified disorders of the skin and subcutaneous tissue related to radiation: Secondary | ICD-10-CM

## 2017-07-03 DIAGNOSIS — C7931 Secondary malignant neoplasm of brain: Secondary | ICD-10-CM | POA: Diagnosis not present

## 2017-07-03 DIAGNOSIS — Z8541 Personal history of malignant neoplasm of cervix uteri: Secondary | ICD-10-CM | POA: Insufficient documentation

## 2017-07-03 DIAGNOSIS — Z85841 Personal history of malignant neoplasm of brain: Secondary | ICD-10-CM | POA: Diagnosis not present

## 2017-07-03 LAB — CBC WITH DIFFERENTIAL/PLATELET
BASO%: 0.4 % (ref 0.0–2.0)
Basophils Absolute: 0 10*3/uL (ref 0.0–0.1)
EOS%: 0 % (ref 0.0–7.0)
Eosinophils Absolute: 0 10*3/uL (ref 0.0–0.5)
HCT: 37 % (ref 34.8–46.6)
HGB: 12.5 g/dL (ref 11.6–15.9)
LYMPH%: 3.5 % — ABNORMAL LOW (ref 14.0–49.7)
MCH: 33.1 pg (ref 25.1–34.0)
MCHC: 33.7 g/dL (ref 31.5–36.0)
MCV: 98 fL (ref 79.5–101.0)
MONO#: 0.7 10*3/uL (ref 0.1–0.9)
MONO%: 8.4 % (ref 0.0–14.0)
NEUT#: 7.6 10*3/uL — ABNORMAL HIGH (ref 1.5–6.5)
NEUT%: 87.7 % — ABNORMAL HIGH (ref 38.4–76.8)
Platelets: 292 10*3/uL (ref 145–400)
RBC: 3.78 10*6/uL (ref 3.70–5.45)
RDW: 14.8 % — ABNORMAL HIGH (ref 11.2–14.5)
WBC: 8.7 10*3/uL (ref 3.9–10.3)
lymph#: 0.3 10*3/uL — ABNORMAL LOW (ref 0.9–3.3)

## 2017-07-03 LAB — COMPREHENSIVE METABOLIC PANEL
ALT: 14 U/L (ref 0–55)
AST: 9 U/L (ref 5–34)
Albumin: 3.6 g/dL (ref 3.5–5.0)
Alkaline Phosphatase: 98 U/L (ref 40–150)
Anion Gap: 10 mEq/L (ref 3–11)
BUN: 14.8 mg/dL (ref 7.0–26.0)
CO2: 23 mEq/L (ref 22–29)
Calcium: 9.7 mg/dL (ref 8.4–10.4)
Chloride: 106 mEq/L (ref 98–109)
Creatinine: 0.8 mg/dL (ref 0.6–1.1)
EGFR: >60 mL/min/{1.73_m2} (ref >60)
Glucose: 121 mg/dl (ref 70–140)
Potassium: 4.3 mEq/L (ref 3.5–5.1)
Sodium: 139 mEq/L (ref 136–145)
Total Bilirubin: 0.47 mg/dL (ref 0.20–1.20)
Total Protein: 7.3 g/dL (ref 6.4–8.3)

## 2017-07-03 MED ORDER — CYANOCOBALAMIN 1000 MCG/ML IJ SOLN
INTRAMUSCULAR | Status: AC
  Filled 2017-07-03: qty 1

## 2017-07-03 MED ORDER — ONDANSETRON HCL 8 MG PO TABS
8.0000 mg | ORAL_TABLET | Freq: Once | ORAL | Status: AC
  Administered 2017-07-03: 8 mg via ORAL

## 2017-07-03 MED ORDER — ONDANSETRON HCL 8 MG PO TABS
ORAL_TABLET | ORAL | Status: AC
  Filled 2017-07-03: qty 1

## 2017-07-03 MED ORDER — DEXAMETHASONE SODIUM PHOSPHATE 10 MG/ML IJ SOLN
INTRAMUSCULAR | Status: AC
  Filled 2017-07-03: qty 1

## 2017-07-03 MED ORDER — SODIUM CHLORIDE 0.9 % IV SOLN
Freq: Once | INTRAVENOUS | Status: AC
  Administered 2017-07-03: 11:00:00 via INTRAVENOUS

## 2017-07-03 MED ORDER — DEXAMETHASONE SODIUM PHOSPHATE 10 MG/ML IJ SOLN
10.0000 mg | Freq: Once | INTRAMUSCULAR | Status: AC
  Administered 2017-07-03: 10 mg via INTRAVENOUS

## 2017-07-03 MED ORDER — SODIUM CHLORIDE 0.9 % IV SOLN
500.0000 mg/m2 | Freq: Once | INTRAVENOUS | Status: AC
  Administered 2017-07-03: 900 mg via INTRAVENOUS
  Filled 2017-07-03: qty 20

## 2017-07-03 MED ORDER — DEXAMETHASONE 4 MG PO TABS: ORAL_TABLET | ORAL | 1 refills | Status: DC

## 2017-07-03 MED ORDER — CYANOCOBALAMIN 1000 MCG/ML IJ SOLN
1000.0000 ug | Freq: Once | INTRAMUSCULAR | Status: AC
  Administered 2017-07-03: 1000 ug via INTRAMUSCULAR

## 2017-07-03 NOTE — Progress Notes (Signed)
[facility] [phone]   [phone]  OFFICE PROGRESS NOTE  Alexis Roers, MD [address] [place] [address]  DIAGNOSIS: Stage IV (T2a, N0, M1b) non-small cell lung cancer, adenocarcinoma with negative EGFR and ALK mutations diagnosed in January 2015 and presented with right upper lobe lung mass in addition to a solitary brain metastasis.  PRIOR THERAPY: 1) Status post stereotactic radiotherapy to a solitary right parietal brain lesion under the care of Dr. [name] on 10/16/2013. 2) Status post palliative radiotherapy to the right lung tumor under the care of Dr. [name] completed on 12/05/2013. 3) Systemic chemotherapy with carboplatin for AUC of 5 and Alimta 500 mg/M2 every 3 weeks. First dose Jan 06 2014. Status post 6 cycles.  CURRENT THERAPY: Systemic chemotherapy with maintenance Alimta 500 MG/M2 every 3 weeks, status post 53 cycles.  INTERVAL HISTORY: Alexis [name] 66 y.o. female returns to the clinic today for follow-up visit.  The patient has no complaints today.  She is feeling fine with no specific complaints.  She denied having any chest pain, shortness of breath, cough or hemoptysis.  She denied having any fever or chills.  She has no nausea, vomiting, diarrhea or constipation.  She continues to tolerate her treatment with maintenance Alimta fairly well.  She had a recent MRI of the brain that showed no clear evidence for disease progression.  The patient is here today for evaluation before starting cycle #54 of her treatment.   MEDICAL HISTORY: Past Medical History:  Diagnosis Date  . Anxiety   . Anxiety 06/20/2016  . Cervical cancer ([facility])   . Encounter for antineoplastic chemotherapy 07/20/2015  . 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  MEDICATIONS:  Current Outpatient Medications  Medication Sig Dispense Refill    . acetaminophen (TYLENOL) 500 MG tablet Take 500 mg by mouth every 6 (six) hours as needed for mild pain or headache. Reported on 11/02/2015    . ALPRAZolam (XANAX) 0.25 MG tablet Take 1 tablet (0.25 mg total) by mouth 3 (three) times daily as needed. for anxiety 45 tablet 0  . Ascorbic Acid (VITAMIN C GUMMIE PO) Take 1 each by mouth every morning.    Marland Kitchen dexamethasone (DECADRON) 4 MG tablet TAKE 1 TABLET BY MOUTH TWICE A DAY THE DAY BEFORE, DAY OF AND DAY AFTER THE CHEMOTHERAPY EVERY 3 WKS 40 tablet 1  . folic acid (FOLVITE) 1 MG tablet TAKE 1 TABLET BY MOUTH EVERY DAY 90 tablet 0  . Multiple Vitamin (MULTIVITAMIN WITH MINERALS) TABS tablet Take 1 tablet by mouth every morning.    Marland Kitchen omeprazole (PRILOSEC) 20 MG capsule Take 1 capsule (20 mg total) by mouth daily. As needed for reflux or indigestion. 42 capsule PRN  . ondansetron (ZOFRAN) 8 MG tablet Take 1 tablet (8 mg total) by mouth every 8 (eight) hours as needed for nausea or vomiting. 30 tablet 0  . OVER THE COUNTER MEDICATION Take 1 tablet by mouth every morning. (Vitamin A)    . prochlorperazine (COMPAZINE) 10 MG tablet Take 1 tablet (10 mg total) by mouth every 6 (six) hours as needed for nausea or vomiting. 60 tablet 0  . senna-docusate (SENOKOT-S) 8.6-50 MG tablet Take 1 tablet by mouth daily. 30 tablet prn  . pentoxifylline (TRENTAL) 400 MG CR tablet TAKE 1 TABLET BY MOUTH  TWICE A DAY (TAKE WITH VITAMIN E 400MG TWICE A DAY)  3   No current facility-administered medications for this visit.     SURGICAL HISTORY:  Past Surgical History:  Procedure Laterality Date  . ABDOMINAL HYSTERECTOMY      REVIEW OF SYSTEMS:  A comprehensive review of systems was negative.   PHYSICAL EXAMINATION: General appearance: alert, cooperative and no distress Head: Normocephalic, without obvious abnormality, atraumatic Neck: no adenopathy, no JVD, supple, symmetrical, trachea midline and thyroid not enlarged, symmetric, no tenderness/mass/nodules Lymph  nodes: Cervical, supraclavicular, and axillary nodes normal. Resp: clear to auscultation bilaterally Back: symmetric, no curvature. ROM normal. No CVA tenderness. Cardio: regular rate and rhythm, S1, S2 normal, no murmur, click, rub or gallop GI: soft, non-tender; bowel sounds normal; no masses,  no organomegaly Extremities: extremities normal, atraumatic, no cyanosis or edema  ECOG PERFORMANCE STATUS: 0 - Asymptomatic  Blood pressure 116/79, pulse 91, temperature 97.8 F (36.6 C), temperature source Oral, resp. rate 18, height _0  (1.626 m), weight 170 lb (77.1 kg), SpO2 100 %.  LABORATORY DATA: Lab Results  Component Value Date   WBC 8.7 07/03/2017   HGB 12.5 07/03/2017   HCT 37.0 07/03/2017   MCV 98.0 07/03/2017   PLT 292 07/03/2017      Chemistry      Component Value Date/Time   NA 139 07/03/2017 0837   K 4.3 07/03/2017 0837   CL 100 07/12/2014 0512   CO2 23 07/03/2017 0837   BUN 14.8 07/03/2017 0837   CREATININE 0.8 07/03/2017 0837      Component Value Date/Time   CALCIUM 9.7 07/03/2017 0837   ALKPHOS 98 07/03/2017 0837   AST 9 07/03/2017 0837   ALT 14 07/03/2017 0837   BILITOT 0.47 07/03/2017 0837       RADIOGRAPHIC STUDIES: Ct Chest W Contrast  Result Date: 06/09/2017 CLINICAL DATA:  Metastatic right lung cancer restaging. EXAM: CT CHEST, ABDOMEN, AND PELVIS WITH CONTRAST TECHNIQUE: Multidetector CT imaging of the chest, abdomen and pelvis was performed following the standard protocol during bolus administration of intravenous contrast. CONTRAST:  110m ISOVUE-300 IOPAMIDOL (ISOVUE-300) INJECTION 61% COMPARISON:  03/15/2017 FINDINGS: CT CHEST FINDINGS Cardiovascular: Aortic and branch vessel atherosclerotic calcification. Mediastinum/Nodes: Right hilar lymph node 0.7 cm in short axis on image 26/2, formerly 0.6 cm. Left paratracheal node 0.8 cm in short axis, previously 0.6 cm. No overtly pathologically enlarged thoracic lymph nodes. Lungs/Pleura: Reduced size  of the right apical cavitary structure or fluid-filled bulla, 1.5 cm on image 24/4, formerly 2.6 cm. Considerable right apical and right upper paramediastinal opacity partially extending into the superior segment right upper lobe, likely related to prior radiation port. Airway thickening is present, suggesting bronchitis or reactive airways disease. Paraseptal and centrilobular emphysema. Left apical pleuroparenchymal scarring. Mild scarring along the lower margin of the left major fissure. Musculoskeletal: Thoracic spondylosis. CT ABDOMEN PELVIS FINDINGS Hepatobiliary: Unremarkable Pancreas: Unremarkable Spleen: Unremarkable Adrenals/Urinary Tract: Unremarkable Stomach/Bowel: Prior partial colectomy with rectosigmoid anastomosis. Vascular/Lymphatic: Aortoiliac atherosclerotic vascular disease. Reproductive: Uterus absent.  Adnexa unremarkable. Other: No supplemental non-categorized findings. Musculoskeletal: Unremarkable IMPRESSION: 1. Reduced size of the fluid-filled bulla at the right lung apex, with right apical scarring and findings of prior radiation therapy. No increase in prominence of the densities in this vicinity to suggest active tumor. No findings of distant metastatic disease. 2. Other imaging findings of potential clinical significance: Aortic Atherosclerosis (ICD10-I70.0) and Emphysema (ICD10-J43.9). Airway thickening is present, suggesting bronchitis or reactive airways disease. Electronically Signed   By:  Van Clines M.D.   On: 06/09/2017 10:24   Mr [name] WJ Contrast  Result Date: 06/29/2017 CLINICAL DATA:  66 year old female with non-small cell lung cancer of the right upper lobe. Right parietal metastasis was treated with radiation in February 2015. 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 Restaging. Subsequent encounter. EXAM: MRI HEAD WITHOUT AND WITH CONTRAST TECHNIQUE: Multiplanar, multiecho pulse sequences of  the brain and surrounding structures were obtained without and with intravenous contrast. CONTRAST:  39m MULTIHANCE GADOBENATE DIMEGLUMINE 529 MG/ML IV SOLN COMPARISON:  Most recent 01/06/2017. FINDINGS: Brain: Continued abnormal enhancement of a cavitary lesion, RIGHT anterior parietal lobe, 9 x 14 x 13 mm, mild restriction, adjacent vasogenic edema, and slight hemorrhage, all stable from multiple priors, as far back as July 2017. Stable findings of generalized atrophy with chronic microvascular ischemic change. No new areas of enhancement. Vascular: Normal flow voids. Skull and upper cervical spine: Normal marrow signal. Cervical spondylosis. Mild pannus. Sinuses/Orbits: Stable and negative.  BILATERAL cataract extraction. Other: None. 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 Continued surrounding vasogenic edema and stable cavitary enhancement, but no progression since most recent priors, nor significant worsening since July 2017. Continued surveillance warranted. Electronically Signed   By: JStaci RighterM.D.   On: 06/29/2017 12:17   Ct Abdomen Pelvis W Contrast  Result Date: 06/09/2017 CLINICAL DATA:  Metastatic right lung cancer restaging. EXAM: CT CHEST, ABDOMEN, AND PELVIS WITH CONTRAST TECHNIQUE: Multidetector CT imaging of the chest, abdomen and pelvis was performed following the standard protocol during bolus administration of intravenous contrast. CONTRAST:  1036mISOVUE-300 IOPAMIDOL (ISOVUE-300) INJECTION 61% COMPARISON:  03/15/2017 FINDINGS: CT CHEST FINDINGS Cardiovascular: Aortic and branch vessel atherosclerotic calcification. Mediastinum/Nodes: Right hilar lymph node 0.7 cm in short axis on image 26/2, formerly 0.6 cm. Left paratracheal node 0.8 cm in short axis, previously 0.6 cm. No overtly pathologically enlarged thoracic lymph nodes. Lungs/Pleura: Reduced size of the right apical cavitary structure or fluid-filled bulla, 1.5 cm on  image 24/4, formerly 2.6 cm. Considerable right apical and right upper paramediastinal opacity partially extending into the superior segment right upper lobe, likely related to prior radiation port. Airway thickening is present, suggesting bronchitis or reactive airways disease. Paraseptal and centrilobular emphysema. Left apical pleuroparenchymal scarring. Mild scarring along the lower margin of the left major fissure. Musculoskeletal: Thoracic spondylosis. CT ABDOMEN PELVIS FINDINGS Hepatobiliary: Unremarkable Pancreas: Unremarkable Spleen: Unremarkable Adrenals/Urinary Tract: Unremarkable Stomach/Bowel: Prior partial colectomy with rectosigmoid anastomosis. Vascular/Lymphatic: Aortoiliac atherosclerotic vascular disease. Reproductive: Uterus absent.  Adnexa unremarkable. Other: No supplemental non-categorized findings. Musculoskeletal: Unremarkable IMPRESSION: 1. Reduced size of the fluid-filled bulla at the right lung apex, with right apical scarring and findings of prior radiation therapy. No increase in prominence of the densities in this vicinity to suggest active tumor. No findings of distant metastatic disease. 2. Other imaging findings of potential clinical significance: Aortic Atherosclerosis (ICD10-I70.0) and Emphysema (ICD10-J43.9). Airway thickening is present, suggesting bronchitis or reactive airways disease. Electronically Signed   By: WaVan Clines.D.   On: 06/09/2017 10:24    ASSESSMENT AND PLAN:  This is a very pleasant [age]ears old white female with metastatic non-small cell lung cancer, adenocarcinoma status post induction systemic chemotherapy with carboplatin and Alimta with partial response. She is currently on maintenance treatment with single agent Alimta status post 53 cycles. The patient continues to tolerate her maintenance treatment fairly well with no significant adverse effects. I  recommended for her to proceed with cycle #54 today as a schedule. I will see her back  for follow-up visit in 3 weeks for evaluation before starting cycle #55. The patient was advised to call immediately if she has any concerning symptoms in the interval. The patient voices understanding of current disease status and treatment options and is in agreement with the current care plan. All questions were answered. The patient knows to call the clinic with any problems, questions or concerns. We can certainly see the patient much sooner if necessary.  Disclaimer: This note was dictated with voice recognition software. Similar sounding words can inadvertently be transcribed and may not be corrected upon review.

## 2017-07-03 NOTE — Patient Instructions (Signed)
[facility] Discharge Instructions for Patients Receiving Chemotherapy  Today you received the following chemotherapy agents: Alimta   To help prevent nausea and vomiting after your treatment, we encourage you to take your nausea medication as directed.    If you develop nausea and vomiting that is not controlled by your nausea medication, call the clinic.   BELOW ARE SYMPTOMS THAT SHOULD BE REPORTED IMMEDIATELY:  *FEVER GREATER THAN 100.5 F  *CHILLS WITH OR WITHOUT FEVER  NAUSEA AND VOMITING THAT IS NOT CONTROLLED WITH YOUR NAUSEA MEDICATION  *UNUSUAL SHORTNESS OF BREATH  *UNUSUAL BRUISING OR BLEEDING  TENDERNESS IN MOUTH AND THROAT WITH OR WITHOUT PRESENCE OF ULCERS  *URINARY PROBLEMS  *BOWEL PROBLEMS  UNUSUAL RASH Items with * indicate a potential emergency and should be followed up as soon as possible.  Feel free to call the clinic should you have any questions or concerns. The clinic phone number is [phone].  Please show the [facility] at check-in to the Emergency Department and triage nurse.

## 2017-07-03 NOTE — Progress Notes (Signed)
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   Interval Since Last Radiation:  3 years, 6 months  10/28/2013 through 12/05/2013: The patient was treated to the right lung tumor to a dose of 50 gray in 25 fractions using a 3-D conformal technique. Daily image guidance was used for the patient's treatment.  10/16/2013 SRS Treatment: PTV1: Rt Parietal 84mm target was treated using 3 Arcs to a prescription dose of 20 Gy. ExacTrac Snap verification was performed for each couch angle.  Narrative:  The patient returns today for routine follow-up. In summary this is a 66 y.o. patient with a history of metastatic lung cancer to the brain who was treated in two sessions in 2015 for her brain disease, followed by local control to the right lung. Since her treatment, she has done well and continues to be NED in the brain, and continues to remain with stable disease on systemic alimta. She is due to begin cycle #54 of this. 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  No change was noted since her scans from July 2017.  On review of systems, the patient reports that she is doing great.  She is really looking forward to the upcoming Thanksgiving holiday, and her husband has been able to get time off that that they can actually come to meals for the first time  in 25 years.  She reports that she continues to do very well, and tolerates Alimta without any untoward side effects.  She denies any chest pain, shortness of breath, cough, fevers, chills, night sweats, unintended weight changes. She denies any bowel or bladder disturbances, and denies abdominal pain, nausea or vomiting. She denies any new musculoskeletal or joint aches or pains. A complete review of systems is obtained and is otherwise negative.  Past Medical History:  Past Medical History:  Diagnosis Date  . Anxiety   . Anxiety 06/20/2016  . Cervical cancer ([place])   . Encounter for antineoplastic chemotherapy 07/20/2015  . 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 ABDOMINAL HYSTERECTOMY      Social History:  Social History   Socioeconomic History  . Marital status: Married    Spouse name: Not on file  . Number of children: Not on file  . Years of education: Not on file  . Highest education level: Not on file  Social Needs  . Financial resource strain: Not on file  . Food insecurity - worry: Not on file  . Food insecurity - inability: Not on file  . Transportation needs - medical: Not on file  . Transportation needs - non-medical: Not on file  Occupational History  . 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 Smoking status: Former  Smoker    Packs/day: 1.00    Years: 40.00    Pack years: 40.00    Types: Cigarettes    Last attempt to quit: 09/27/2013    Years since quitting: 3.7  . Smokeless tobacco: Never Used  Substance and Sexual Activity  . Alcohol use: No  . Drug use: No  . Sexual activity: Yes  Other Topics Concern  . Not on file  Social History Narrative  . Not on file  The patient is married and accompanied by her husband.  She lives in [place], [place].  Family  History: Family History  Problem Relation Age of Onset  . Emphysema Father        smoked  . Lung cancer Father        smoked  . Cancer Mother   . Hypertension Mother   . COPD Mother     ALLERGIES:  is allergic to codeine.  Meds: Current Outpatient Medications  Medication Sig Dispense Refill  . acetaminophen (TYLENOL) 500 MG tablet Take 500 mg by mouth every 6 (six) hours as needed for mild pain or headache. Reported on 11/02/2015    . Ascorbic Acid (VITAMIN C GUMMIE PO) Take 1 each by mouth every morning.    . folic acid (FOLVITE) 1 MG tablet TAKE 1 TABLET BY MOUTH EVERY DAY 90 tablet 0  . Multiple Vitamin (MULTIVITAMIN WITH MINERALS) TABS tablet Take 1 tablet by mouth every morning.    Marland Kitchen omeprazole (PRILOSEC) 20 MG capsule Take 1 capsule (20 mg total) by mouth daily. As needed for reflux or indigestion. 42 capsule PRN  . ALPRAZolam (XANAX) 0.25 MG tablet Take 1 tablet (0.25 mg total) by mouth 3 (three) times daily as needed. for anxiety 45 tablet 0  . dexamethasone (DECADRON) 4 MG tablet TAKE 1 TABLET BY MOUTH TWICE A DAY THE DAY BEFORE, DAY OF AND DAY AFTER THE CHEMOTHERAPY EVERY 3 WKS 40 tablet 1  . ondansetron (ZOFRAN) 8 MG tablet Take 1 tablet (8 mg total) by mouth every 8 (eight) hours as needed for nausea or vomiting. 30 tablet 0  . OVER THE COUNTER MEDICATION Take 1 tablet by mouth every morning. (Vitamin A)    . pentoxifylline (TRENTAL) 400 MG CR tablet TAKE 1 TABLET BY MOUTH TWICE A DAY (TAKE WITH VITAMIN E 400MG  TWICE A DAY)  3  . prochlorperazine (COMPAZINE) 10 MG tablet Take 1 tablet (10 mg total) by mouth every 6 (six) hours as needed for nausea or vomiting. 60 tablet 0  . senna-docusate (SENOKOT-S) 8.6-50 MG tablet Take 1 tablet by mouth daily. 30 tablet prn   No current facility-administered medications for this encounter.    Facility-Administered Medications Ordered in Other Encounters  Medication Dose Route Frequency Provider Last Rate Last Dose  . PEMEtrexed  (ALIMTA) 900 mg in sodium chloride 0.9 % 100 mL chemo infusion  500 mg/m2 (Treatment Plan Recorded) Intravenous Once Curt Bears, MD   900 mg at 07/03/17 1230    Physical Findings:  height is 5\' 4"  (1.626 m) and weight is 170 lb 3.2 oz (77.2 kg). Her oral temperature is 97.8 F (36.6 C). Her blood pressure is 116/79 and her pulse is 91. Her respiration is 18 and oxygen saturation is 100%.  In general this is a well appearing Caucasian female in no acute distress. She's alert and oriented x4 and appropriate throughout the examination. She is normocephalic, atraumatic. EOMS are intact. Cardiopulmonary assessment is negative for acute distress and she exhibits normal effort.  No focal changes are noted neurologically.  She is in excellent spirits.  Lab Findings: Lab Results  Component Value Date   WBC 8.7 07/03/2017   HGB 12.5 07/03/2017   HCT 37.0 07/03/2017   MCV 98.0 07/03/2017   PLT 292 07/03/2017     Radiographic Findings: Ct Chest W Contrast  Result Date: 06/09/2017 CLINICAL DATA:  Metastatic right lung cancer restaging. EXAM: CT CHEST, ABDOMEN, AND PELVIS WITH CONTRAST TECHNIQUE: Multidetector CT imaging of the chest, abdomen and pelvis was performed following the standard protocol during bolus administration of intravenous contrast. CONTRAST:  134mL ISOVUE-300 IOPAMIDOL (ISOVUE-300) INJECTION 61% COMPARISON:  03/15/2017 FINDINGS: CT CHEST FINDINGS Cardiovascular: Aortic and branch vessel atherosclerotic calcification. Mediastinum/Nodes: Right hilar lymph node 0.7 cm in short axis on image 26/2, formerly 0.6 cm. Left paratracheal node 0.8 cm in short axis, previously 0.6 cm. No overtly pathologically enlarged thoracic lymph nodes. Lungs/Pleura: Reduced size of the right apical cavitary structure or fluid-filled bulla, 1.5 cm on image 24/4, formerly 2.6 cm. Considerable right apical and right upper paramediastinal opacity partially extending into the superior segment right upper lobe,  likely related to prior radiation port. Airway thickening is present, suggesting bronchitis or reactive airways disease. Paraseptal and centrilobular emphysema. Left apical pleuroparenchymal scarring. Mild scarring along the lower margin of the left major fissure. Musculoskeletal: Thoracic spondylosis. CT ABDOMEN PELVIS FINDINGS Hepatobiliary: Unremarkable Pancreas: Unremarkable Spleen: Unremarkable Adrenals/Urinary Tract: Unremarkable Stomach/Bowel: Prior partial colectomy with rectosigmoid anastomosis. Vascular/Lymphatic: Aortoiliac atherosclerotic vascular disease. Reproductive: Uterus absent.  Adnexa unremarkable. Other: No supplemental non-categorized findings. Musculoskeletal: Unremarkable IMPRESSION: 1. Reduced size of the fluid-filled bulla at the right lung apex, with right apical scarring and findings of prior radiation therapy. No increase in prominence of the densities in this vicinity to suggest active tumor. No findings of distant metastatic disease. 2. Other imaging findings of potential clinical significance: Aortic Atherosclerosis (ICD10-I70.0) and Emphysema (ICD10-J43.9). Airway thickening is present, suggesting bronchitis or reactive airways disease. Electronically Signed   By: Van Clines M.D.   On: 06/09/2017 10:24   Mr [name] WE Contrast  Result Date: 06/29/2017 CLINICAL DATA:  66 year old female with non-small cell lung cancer of the right upper lobe. Right parietal metastasis was treated with radiation in February 2015. 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 Restaging. Subsequent encounter. EXAM: MRI HEAD WITHOUT AND WITH CONTRAST TECHNIQUE: Multiplanar, multiecho pulse sequences of the brain and surrounding structures were obtained without and with intravenous contrast. CONTRAST:  48mL MULTIHANCE GADOBENATE DIMEGLUMINE 529 MG/ML IV SOLN COMPARISON:  Most recent 01/06/2017. FINDINGS: Brain: Continued abnormal  enhancement of a cavitary lesion, RIGHT anterior parietal lobe, 9 x 14 x 13 mm, mild restriction, adjacent vasogenic edema, and slight hemorrhage, all stable from multiple priors, as far back as July 2017. Stable findings of generalized atrophy with chronic microvascular ischemic change. No new areas of enhancement. Vascular: Normal flow voids. Skull and upper cervical spine: Normal marrow signal. Cervical spondylosis. Mild pannus. Sinuses/Orbits: Stable and negative.  BILATERAL cataract extraction. Other: None. 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 Continued surrounding vasogenic edema and stable cavitary enhancement, but no progression since most recent priors, nor significant worsening since July 2017. Continued surveillance warranted. Electronically Signed   By: Staci Righter M.D.   On: 06/29/2017 12:17   Ct Abdomen Pelvis W Contrast  Result Date: 06/09/2017 CLINICAL DATA:  Metastatic right lung cancer restaging. EXAM: CT CHEST, ABDOMEN, AND PELVIS WITH  CONTRAST TECHNIQUE: Multidetector CT imaging of the chest, abdomen and pelvis was performed following the standard protocol during bolus administration of intravenous contrast. CONTRAST:  151mL ISOVUE-300 IOPAMIDOL (ISOVUE-300) INJECTION 61% COMPARISON:  03/15/2017 FINDINGS: CT CHEST FINDINGS Cardiovascular: Aortic and branch vessel atherosclerotic calcification. Mediastinum/Nodes: Right hilar lymph node 0.7 cm in short axis on image 26/2, formerly 0.6 cm. Left paratracheal node 0.8 cm in short axis, previously 0.6 cm. No overtly pathologically enlarged thoracic lymph nodes. Lungs/Pleura: Reduced size of the right apical cavitary structure or fluid-filled bulla, 1.5 cm on image 24/4, formerly 2.6 cm. Considerable right apical and right upper paramediastinal opacity partially extending into the superior segment right upper lobe, likely related to prior radiation port. Airway thickening is present,  suggesting bronchitis or reactive airways disease. Paraseptal and centrilobular emphysema. Left apical pleuroparenchymal scarring. Mild scarring along the lower margin of the left major fissure. Musculoskeletal: Thoracic spondylosis. CT ABDOMEN PELVIS FINDINGS Hepatobiliary: Unremarkable Pancreas: Unremarkable Spleen: Unremarkable Adrenals/Urinary Tract: Unremarkable Stomach/Bowel: Prior partial colectomy with rectosigmoid anastomosis. Vascular/Lymphatic: Aortoiliac atherosclerotic vascular disease. Reproductive: Uterus absent.  Adnexa unremarkable. Other: No supplemental non-categorized findings. Musculoskeletal: Unremarkable IMPRESSION: 1. Reduced size of the fluid-filled bulla at the right lung apex, with right apical scarring and findings of prior radiation therapy. No increase in prominence of the densities in this vicinity to suggest active tumor. No findings of distant metastatic disease. 2. Other imaging findings of potential clinical significance: Aortic Atherosclerosis (ICD10-I70.0) and Emphysema (ICD10-J43.9). Airway thickening is present, suggesting bronchitis or reactive airways disease. Electronically Signed   By: Van Clines M.D.   On: 06/09/2017 10:24    Impression/Plan: 1. 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  The patient scan was reviewed and discussion was also reviewed with her from this morning.  Recommendations have been made to pursue repeat imaging, and she remains interested in staying at a six-month interval for this knowing the risks of less frequent imaging.  That being said however she has been stable for more than 2 months, and I think this is reasonable.  She will continue to follow-up with Dr. Julien Nordmann, and is due for repeat imaging in the next month or so, she is planning to start cycle #54 of Alimta this week.  This seems to be tolerable for her, and we hope this will  continue to maintain stability of disease.   2. 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  Her stability regarding this in conference, recommendations have been made to discontinue both vitamin E and Trental.  The patient is hesitant to give up the vitamin D as she believes that there continue to be cardiovascular benefit.  She will discontinue the Trental, and let us know how she is doing at her next interval, or sooner if she becomes symptomatic.    Carola Rhine, PAC

## 2017-07-04 ENCOUNTER — Ambulatory Visit: Payer: Medicare Other

## 2017-07-10 ENCOUNTER — Telehealth: Payer: Self-pay | Admitting: Internal Medicine

## 2017-07-10 NOTE — Telephone Encounter (Signed)
Scheduled appt per 11/05 los - patient to get an updated schedule next visit.

## 2017-07-17 ENCOUNTER — Other Ambulatory Visit: Payer: Self-pay | Admitting: *Deleted

## 2017-07-17 DIAGNOSIS — C3411 Malignant neoplasm of upper lobe, right bronchus or lung: Secondary | ICD-10-CM

## 2017-07-17 DIAGNOSIS — J449 Chronic obstructive pulmonary disease, unspecified: Secondary | ICD-10-CM

## 2017-07-18 ENCOUNTER — Other Ambulatory Visit: Payer: Self-pay | Admitting: Internal Medicine

## 2017-07-18 DIAGNOSIS — C3490 Malignant neoplasm of unspecified part of unspecified bronchus or lung: Secondary | ICD-10-CM

## 2017-07-24 ENCOUNTER — Telehealth: Payer: Self-pay | Admitting: Internal Medicine

## 2017-07-24 ENCOUNTER — Ambulatory Visit (HOSPITAL_BASED_OUTPATIENT_CLINIC_OR_DEPARTMENT_OTHER): Payer: Medicare Other

## 2017-07-24 ENCOUNTER — Other Ambulatory Visit (HOSPITAL_BASED_OUTPATIENT_CLINIC_OR_DEPARTMENT_OTHER): Payer: Medicare Other

## 2017-07-24 ENCOUNTER — Encounter: Payer: Self-pay | Admitting: Internal Medicine

## 2017-07-24 ENCOUNTER — Ambulatory Visit (HOSPITAL_BASED_OUTPATIENT_CLINIC_OR_DEPARTMENT_OTHER): Payer: Medicare Other | Admitting: Internal Medicine

## 2017-07-24 VITALS — BP 119/62 | HR 74 | Temp 97.7°F | Resp 18 | Ht 64.0 in | Wt 173.5 lb

## 2017-07-24 DIAGNOSIS — C3411 Malignant neoplasm of upper lobe, right bronchus or lung: Secondary | ICD-10-CM

## 2017-07-24 DIAGNOSIS — Z5111 Encounter for antineoplastic chemotherapy: Secondary | ICD-10-CM

## 2017-07-24 DIAGNOSIS — F419 Anxiety disorder, unspecified: Secondary | ICD-10-CM | POA: Diagnosis not present

## 2017-07-24 DIAGNOSIS — F411 Generalized anxiety disorder: Secondary | ICD-10-CM

## 2017-07-24 DIAGNOSIS — C7931 Secondary malignant neoplasm of brain: Secondary | ICD-10-CM

## 2017-07-24 DIAGNOSIS — C7949 Secondary malignant neoplasm of other parts of nervous system: Secondary | ICD-10-CM

## 2017-07-24 LAB — COMPREHENSIVE METABOLIC PANEL
ALT: 15 U/L (ref 0–55)
AST: 11 U/L (ref 5–34)
Albumin: 3.4 g/dL — ABNORMAL LOW (ref 3.5–5.0)
Alkaline Phosphatase: 93 U/L (ref 40–150)
Anion Gap: 9 mEq/L (ref 3–11)
BUN: 14.5 mg/dL (ref 7.0–26.0)
CO2: 23 mEq/L (ref 22–29)
Calcium: 9.7 mg/dL (ref 8.4–10.4)
Chloride: 106 mEq/L (ref 98–109)
Creatinine: 0.8 mg/dL (ref 0.6–1.1)
EGFR: >60 mL/min/{1.73_m2} (ref >60)
Glucose: 120 mg/dl (ref 70–140)
Potassium: 4.1 mEq/L (ref 3.5–5.1)
Sodium: 139 mEq/L (ref 136–145)
Total Bilirubin: 0.33 mg/dL (ref 0.20–1.20)
Total Protein: 6.9 g/dL (ref 6.4–8.3)

## 2017-07-24 LAB — CBC WITH DIFFERENTIAL/PLATELET
BASO%: 0 % (ref 0.0–2.0)
Basophils Absolute: 0 10*3/uL (ref 0.0–0.1)
EOS%: 0 % (ref 0.0–7.0)
Eosinophils Absolute: 0 10*3/uL (ref 0.0–0.5)
HCT: 38.6 % (ref 34.8–46.6)
HGB: 12.3 g/dL (ref 11.6–15.9)
LYMPH%: 3.1 % — ABNORMAL LOW (ref 14.0–49.7)
MCH: 32.3 pg (ref 25.1–34.0)
MCHC: 31.9 g/dL (ref 31.5–36.0)
MCV: 101.3 fL — ABNORMAL HIGH (ref 79.5–101.0)
MONO#: 1.1 10*3/uL — ABNORMAL HIGH (ref 0.1–0.9)
MONO%: 8.9 % (ref 0.0–14.0)
NEUT#: 10.5 10*3/uL — ABNORMAL HIGH (ref 1.5–6.5)
NEUT%: 88 % — ABNORMAL HIGH (ref 38.4–76.8)
Platelets: 291 10*3/uL (ref 145–400)
RBC: 3.81 10*6/uL (ref 3.70–5.45)
RDW: 14.9 % — ABNORMAL HIGH (ref 11.2–14.5)
WBC: 11.9 10*3/uL — ABNORMAL HIGH (ref 3.9–10.3)
lymph#: 0.4 10*3/uL — ABNORMAL LOW (ref 0.9–3.3)

## 2017-07-24 LAB — RESEARCH LABS

## 2017-07-24 MED ORDER — ONDANSETRON HCL 8 MG PO TABS
ORAL_TABLET | ORAL | Status: AC
  Filled 2017-07-24: qty 1

## 2017-07-24 MED ORDER — ONDANSETRON HCL 8 MG PO TABS
8.0000 mg | ORAL_TABLET | Freq: Once | ORAL | Status: AC
  Administered 2017-07-24: 8 mg via ORAL

## 2017-07-24 MED ORDER — SODIUM CHLORIDE 0.9 % IV SOLN
Freq: Once | INTRAVENOUS | Status: AC
  Administered 2017-07-24: 10:00:00 via INTRAVENOUS

## 2017-07-24 MED ORDER — DEXAMETHASONE SODIUM PHOSPHATE 10 MG/ML IJ SOLN
10.0000 mg | Freq: Once | INTRAMUSCULAR | Status: AC
  Administered 2017-07-24: 10 mg via INTRAVENOUS

## 2017-07-24 MED ORDER — ALPRAZOLAM 0.25 MG PO TABS: 0.2500 mg | ORAL_TABLET | Freq: Three times a day (TID) | ORAL | 0 refills | Status: DC | PRN

## 2017-07-24 MED ORDER — DEXAMETHASONE SODIUM PHOSPHATE 10 MG/ML IJ SOLN
INTRAMUSCULAR | Status: AC
  Filled 2017-07-24: qty 1

## 2017-07-24 MED ORDER — PEMETREXED DISODIUM CHEMO INJECTION 500 MG
500.0000 mg/m2 | Freq: Once | INTRAVENOUS | Status: AC
  Administered 2017-07-24: 900 mg via INTRAVENOUS
  Filled 2017-07-24: qty 20

## 2017-07-24 NOTE — Telephone Encounter (Signed)
Gave avs and calendar for December - February 2019

## 2017-07-24 NOTE — Progress Notes (Signed)
[facility] [phone]   Fax:([phone]) [phone]  OFFICE PROGRESS NOTE  [name], MD [address] [place] [address]  DIAGNOSIS: Stage IV (T2a, N0, M1b) non-small cell lung cancer, adenocarcinoma with negative EGFR and ALK mutations diagnosed in January 2015 and presented with right upper lobe lung mass in addition to a solitary brain metastasis.  PRIOR THERAPY: 1) Status post stereotactic radiotherapy to a solitary right parietal brain lesion under the care of Dr. [name] on 10/16/2013. 2) Status post palliative radiotherapy to the right lung tumor under the care of Dr. [name] completed on 12/05/2013. 3) Systemic chemotherapy with carboplatin for AUC of 5 and Alimta 500 mg/M2 every 3 weeks. First dose Jan 06 2014. Status post 6 cycles.  CURRENT THERAPY: Systemic chemotherapy with maintenance Alimta 500 MG/M2 every 3 weeks, status post 54 cycles.  INTERVAL HISTORY: Alexis [name] 66 y.o. female returns to the clinic today for follow-up visit accompanied by her husband.  The patient is feeling fine today with no specific complaints.  She denied having any chest pain, shortness of breath, cough or hemoptysis.  She denied having any weight loss or night sweats.  She has no nausea, vomiting, diarrhea or constipation.  She continues to tolerate her maintenance treatment with Alimta fairly well.  She is here today for evaluation before starting cycle #55.   MEDICAL HISTORY: Past Medical History:  Diagnosis Date  . Anxiety   . Anxiety 06/20/2016  . Cervical cancer ([place])   . Encounter for antineoplastic chemotherapy 07/20/2015  . 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  MEDICATIONS:  Current Outpatient Medications  Medication Sig Dispense Refill  . acetaminophen (TYLENOL) 500 MG tablet Take 500 mg by mouth every 6 (six) hours as needed for mild  pain or headache. Reported on 11/02/2015    . ALPRAZolam (XANAX) 0.25 MG tablet Take 1 tablet (0.25 mg total) by mouth 3 (three) times daily as needed. for anxiety 45 tablet 0  . Ascorbic Acid (VITAMIN C GUMMIE PO) Take 1 each by mouth every morning.    Marland Kitchen dexamethasone (DECADRON) 4 MG tablet TAKE 1 TABLET BY MOUTH TWICE A DAY THE DAY BEFORE, DAY OF AND DAY AFTER THE CHEMOTHERAPY EVERY 3 WKS 40 tablet 1  . folic acid (FOLVITE) 1 MG tablet TAKE 1 TABLET BY MOUTH EVERY DAY 90 tablet 0  . Multiple Vitamin (MULTIVITAMIN WITH MINERALS) TABS tablet Take 1 tablet by mouth every morning.    Marland Kitchen omeprazole (PRILOSEC) 20 MG capsule Take 1 capsule (20 mg total) by mouth daily. As needed for reflux or indigestion. 42 capsule PRN  . ondansetron (ZOFRAN) 8 MG tablet Take 1 tablet (8 mg total) by mouth every 8 (eight) hours as needed for nausea or vomiting. 30 tablet 0  . OVER THE COUNTER MEDICATION Take 1 tablet by mouth every morning. (Vitamin A)    . pentoxifylline (TRENTAL) 400 MG CR tablet TAKE 1 TABLET BY MOUTH TWICE A DAY (TAKE WITH VITAMIN E 400MG TWICE A DAY)  3  . prochlorperazine (COMPAZINE) 10 MG tablet Take 1 tablet (10 mg total) by mouth every 6 (six) hours as needed for nausea or vomiting. 60 tablet 0  . senna-docusate (SENOKOT-S) 8.6-50 MG tablet Take 1 tablet by mouth daily. 30 tablet prn   No current facility-administered medications for this  visit.     SURGICAL HISTORY:  Past Surgical History:  Procedure Laterality Date  . ABDOMINAL HYSTERECTOMY    . COLOSTOMY TAKEDOWN N/A 07/10/2014   Procedure: LAPAROSCOPIC LYSIS OF ADHESIONS (90 MIN) LAPAROSCOPIC ASSISTED COLOSTOMY CLOSURE, RIGID PROCTOSIGMOIDOSCOPY;  Surgeon: Jackolyn Confer, MD;  Location: [facility];  Service: General;  Laterality: N/A;  . LAPAROTOMY N/A 11/03/2013   Procedure: EXPLORATORY LAPAROTOMY, DRAINAGE OF INTRA  ABDOMINAL ABSCESSES, MOBILIZATION OF SPLENIC FLEXURE, SIGMOID COLECTOMY WITH COLOSTOMY;  Surgeon: Odis Hollingshead, MD;   Location: [facility];  Service: General;  Laterality: N/A;  . VIDEO BRONCHOSCOPY Bilateral 08/30/2013   Procedure: VIDEO BRONCHOSCOPY WITH FLUORO;  Surgeon: Tanda Rockers, MD;  Location: [facility];  Service: Cardiopulmonary;  Laterality: Bilateral;    REVIEW OF SYSTEMS:  A comprehensive review of systems was negative.   PHYSICAL EXAMINATION: General appearance: alert, cooperative and no distress Head: Normocephalic, without obvious abnormality, atraumatic Neck: no adenopathy, no JVD, supple, symmetrical, trachea midline and thyroid not enlarged, symmetric, no tenderness/mass/nodules Lymph nodes: Cervical, supraclavicular, and axillary nodes normal. Resp: clear to auscultation bilaterally Back: symmetric, no curvature. ROM normal. No CVA tenderness. Cardio: regular rate and rhythm, S1, S2 normal, no murmur, click, rub or gallop GI: soft, non-tender; bowel sounds normal; no masses,  no organomegaly Extremities: extremities normal, atraumatic, no cyanosis or edema  ECOG PERFORMANCE STATUS: 0 - Asymptomatic  Blood pressure 119/62, pulse 74, temperature 97.7 F (36.5 C), temperature source Oral, resp. rate 18, height '5\' 4"'$  (1.626 m), weight 173 lb 8 oz (78.7 kg), SpO2 100 %.  LABORATORY DATA: Lab Results  Component Value Date   WBC 11.9 (H) 07/24/2017   HGB 12.3 07/24/2017   HCT 38.6 07/24/2017   MCV 101.3 (H) 07/24/2017   PLT 291 07/24/2017      Chemistry      Component Value Date/Time   NA 139 07/03/2017 0837   K 4.3 07/03/2017 0837   CL 100 07/12/2014 0512   CO2 23 07/03/2017 0837   BUN 14.8 07/03/2017 0837   CREATININE 0.8 07/03/2017 0837      Component Value Date/Time   CALCIUM 9.7 07/03/2017 0837   ALKPHOS 98 07/03/2017 0837   AST 9 07/03/2017 0837   ALT 14 07/03/2017 0837   BILITOT 0.47 07/03/2017 0837       RADIOGRAPHIC STUDIES: Mr [name] QT Contrast  Result Date: 06/29/2017 CLINICAL DATA:  66 year old female with non-small cell lung cancer of the right upper  lobe. Right parietal metastasis was treated with radiation in February 2015. 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 Restaging. Subsequent encounter. EXAM: MRI HEAD WITHOUT AND WITH CONTRAST TECHNIQUE: Multiplanar, multiecho pulse sequences of the brain and surrounding structures were obtained without and with intravenous contrast. CONTRAST:  26m MULTIHANCE GADOBENATE DIMEGLUMINE 529 MG/ML IV SOLN COMPARISON:  Most recent 01/06/2017. FINDINGS: Brain: Continued abnormal enhancement of a cavitary lesion, RIGHT anterior parietal lobe, 9 x 14 x 13 mm, mild restriction, adjacent vasogenic edema, and slight hemorrhage, all stable from multiple priors, as far back as July 2017. Stable findings of generalized atrophy with chronic microvascular ischemic change. No new areas of enhancement. Vascular: Normal flow voids. Skull and upper cervical spine: Normal marrow signal. Cervical spondylosis. Mild pannus. Sinuses/Orbits: Stable and negative.  BILATERAL cataract extraction. Other: None. 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 Continued surrounding vasogenic edema and stable cavitary enhancement, but no progression since most recent priors,  nor significant worsening since July 2017. Continued surveillance warranted. Electronically Signed   By: Staci Righter M.D.   On: 06/29/2017 12:17    ASSESSMENT AND PLAN:  This is a very pleasant 66 years old white female with metastatic non-small cell lung cancer, adenocarcinoma status post induction systemic chemotherapy with carboplatin and Alimta with partial response. She is currently on maintenance treatment with single agent Alimta status post 54 cycles. The patient tolerated the last cycle of her treatment well. I recommended for her to proceed with cycle #55 today. I will see her back for follow-up visit in 3 weeks for evaluation before starting  the next cycle of her treatment. For anxiety, I gave the patient refill of Xanax. She was advised to call immediately if she has any concerning symptoms in the interval. The patient voices understanding of current disease status and treatment options and is in agreement with the current care plan. All questions were answered. The patient knows to call the clinic with any problems, questions or concerns. We can certainly see the patient much sooner if necessary.  Disclaimer: This note was dictated with voice recognition software. Similar sounding words can inadvertently be transcribed and may not be corrected upon review.

## 2017-08-14 ENCOUNTER — Other Ambulatory Visit (HOSPITAL_BASED_OUTPATIENT_CLINIC_OR_DEPARTMENT_OTHER): Payer: Medicare Other

## 2017-08-14 ENCOUNTER — Ambulatory Visit (HOSPITAL_BASED_OUTPATIENT_CLINIC_OR_DEPARTMENT_OTHER): Payer: Medicare Other

## 2017-08-14 ENCOUNTER — Ambulatory Visit (HOSPITAL_BASED_OUTPATIENT_CLINIC_OR_DEPARTMENT_OTHER): Payer: Medicare Other | Admitting: Internal Medicine

## 2017-08-14 ENCOUNTER — Encounter: Payer: Self-pay | Admitting: Internal Medicine

## 2017-08-14 ENCOUNTER — Ambulatory Visit: Payer: Medicare Other | Admitting: Nutrition

## 2017-08-14 DIAGNOSIS — C3411 Malignant neoplasm of upper lobe, right bronchus or lung: Secondary | ICD-10-CM

## 2017-08-14 DIAGNOSIS — C7931 Secondary malignant neoplasm of brain: Secondary | ICD-10-CM

## 2017-08-14 DIAGNOSIS — C7951 Secondary malignant neoplasm of bone: Secondary | ICD-10-CM | POA: Diagnosis not present

## 2017-08-14 DIAGNOSIS — C3490 Malignant neoplasm of unspecified part of unspecified bronchus or lung: Secondary | ICD-10-CM

## 2017-08-14 LAB — COMPREHENSIVE METABOLIC PANEL
ALT: 12 U/L (ref 0–55)
AST: 12 U/L (ref 5–34)
Albumin: 3.5 g/dL (ref 3.5–5.0)
Alkaline Phosphatase: 107 U/L (ref 40–150)
Anion Gap: 9 mEq/L (ref 3–11)
BUN: 13.3 mg/dL (ref 7.0–26.0)
CO2: 24 mEq/L (ref 22–29)
Calcium: 9.3 mg/dL (ref 8.4–10.4)
Chloride: 106 mEq/L (ref 98–109)
Creatinine: 0.8 mg/dL (ref 0.6–1.1)
EGFR: >60 mL/min/{1.73_m2} (ref >60)
Glucose: 114 mg/dl (ref 70–140)
Potassium: 4.4 mEq/L (ref 3.5–5.1)
Sodium: 139 mEq/L (ref 136–145)
Total Bilirubin: 0.37 mg/dL (ref 0.20–1.20)
Total Protein: 7.1 g/dL (ref 6.4–8.3)

## 2017-08-14 LAB — CBC WITH DIFFERENTIAL/PLATELET
BASO%: 0.2 % (ref 0.0–2.0)
Basophils Absolute: 0 10*3/uL (ref 0.0–0.1)
EOS%: 0 % (ref 0.0–7.0)
Eosinophils Absolute: 0 10*3/uL (ref 0.0–0.5)
HCT: 35 % (ref 34.8–46.6)
HGB: 11.8 g/dL (ref 11.6–15.9)
LYMPH%: 4.7 % — ABNORMAL LOW (ref 14.0–49.7)
MCH: 33.1 pg (ref 25.1–34.0)
MCHC: 33.6 g/dL (ref 31.5–36.0)
MCV: 98.6 fL (ref 79.5–101.0)
MONO#: 0.6 10*3/uL (ref 0.1–0.9)
MONO%: 8.2 % (ref 0.0–14.0)
NEUT#: 6.3 10*3/uL (ref 1.5–6.5)
NEUT%: 86.9 % — ABNORMAL HIGH (ref 38.4–76.8)
Platelets: 293 10*3/uL (ref 145–400)
RBC: 3.55 10*6/uL — ABNORMAL LOW (ref 3.70–5.45)
RDW: 14.3 % (ref 11.2–14.5)
WBC: 7.2 10*3/uL (ref 3.9–10.3)
lymph#: 0.3 10*3/uL — ABNORMAL LOW (ref 0.9–3.3)

## 2017-08-14 MED ORDER — SODIUM CHLORIDE 0.9 % IV SOLN
500.0000 mg/m2 | Freq: Once | INTRAVENOUS | Status: AC
  Administered 2017-08-14: 900 mg via INTRAVENOUS
  Filled 2017-08-14: qty 20

## 2017-08-14 MED ORDER — DEXAMETHASONE SODIUM PHOSPHATE 10 MG/ML IJ SOLN
INTRAMUSCULAR | Status: AC
  Filled 2017-08-14: qty 1

## 2017-08-14 MED ORDER — ONDANSETRON HCL 8 MG PO TABS
ORAL_TABLET | ORAL | Status: AC
  Filled 2017-08-14: qty 1

## 2017-08-14 MED ORDER — ONDANSETRON HCL 8 MG PO TABS
8.0000 mg | ORAL_TABLET | Freq: Once | ORAL | Status: AC
  Administered 2017-08-14: 8 mg via ORAL

## 2017-08-14 MED ORDER — DEXAMETHASONE SODIUM PHOSPHATE 10 MG/ML IJ SOLN
10.0000 mg | Freq: Once | INTRAMUSCULAR | Status: AC
  Administered 2017-08-14: 10 mg via INTRAVENOUS

## 2017-08-14 MED ORDER — SODIUM CHLORIDE 0.9 % IV SOLN
Freq: Once | INTRAVENOUS | Status: AC
  Administered 2017-08-14: 10:00:00 via INTRAVENOUS

## 2017-08-14 NOTE — Progress Notes (Signed)
Patient asked me to see her today in infusion. Patient concerned because she has gained weight since the beginning of her treatment. States she wants me to help her lose weight. Patient continues to receive treatment for stage IV non-small cell lung cancer. Reviewed healthy diet with patient. Encouraged her to focus on plant-based foods including vegetables, whole gr and fresh fruits Discouraged patient from assuming too many sweets and regular, soft drinks. Patient was appreciative of information.  **Disclaimer: This note was dictated with voice recognition software. Similar sounding words can inadvertently be transcribed and this note may contain transcription errors which may not have been corrected upon publication of note.**

## 2017-08-14 NOTE — Progress Notes (Signed)
[facility] [phone]   [phone]  OFFICE PROGRESS NOTE  [name], MD [address] [place] [address]  DIAGNOSIS: Stage IV (T2a, N0, M1b) non-small cell lung cancer, adenocarcinoma with negative EGFR and ALK mutations diagnosed in January 2015 and presented with right upper lobe lung mass in addition to a solitary brain metastasis.  PRIOR THERAPY: 1) Status post stereotactic radiotherapy to a solitary right parietal brain lesion under the care of Dr. [name] on 10/16/2013. 2) Status post palliative radiotherapy to the right lung tumor under the care of Dr. [name] completed on 12/05/2013. 3) Systemic chemotherapy with carboplatin for AUC of 5 and Alimta 500 mg/M2 every 3 weeks. First dose Jan 06 2014. Status post 6 cycles.  CURRENT THERAPY: Systemic chemotherapy with maintenance Alimta 500 MG/M2 every 3 weeks, status post 55 cycles.  INTERVAL HISTORY: Alexis [name] 66 y.o. female returns to the clinic today for follow-up visit accompanied by her husband.  The patient is feeling fine today with no specific complaints.  She denied having any chest pain, shortness of breath, cough or hemoptysis.  She denied having any weight loss or night sweats.  She has no nausea, vomiting, diarrhea or constipation.  She has no fever or chills.  She continues to tolerate her treatment with maintenance Alimta fairly well.  She is here for evaluation before starting cycle #56.   MEDICAL HISTORY: Past Medical History:  Diagnosis Date  . Anxiety   . Anxiety 06/20/2016  . Cervical cancer ([place])   . Encounter for antineoplastic chemotherapy 07/20/2015  . 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  MEDICATIONS:  Current Outpatient Medications  Medication Sig Dispense Refill  . acetaminophen (TYLENOL) 500 MG tablet Take 500 mg by mouth every 6 (six)  hours as needed for mild pain or headache. Reported on 11/02/2015    . ALPRAZolam (XANAX) 0.25 MG tablet Take 1 tablet (0.25 mg total) by mouth 3 (three) times daily as needed. for anxiety 45 tablet 0  . Ascorbic Acid (VITAMIN C GUMMIE PO) Take 1 each by mouth every morning.    Marland Kitchen dexamethasone (DECADRON) 4 MG tablet TAKE 1 TABLET BY MOUTH TWICE A DAY THE DAY BEFORE, DAY OF AND DAY AFTER THE CHEMOTHERAPY EVERY 3 WKS 40 tablet 1  . folic acid (FOLVITE) 1 MG tablet TAKE 1 TABLET BY MOUTH EVERY DAY 90 tablet 0  . Multiple Vitamin (MULTIVITAMIN WITH MINERALS) TABS tablet Take 1 tablet by mouth every morning.    Marland Kitchen omeprazole (PRILOSEC) 20 MG capsule Take 1 capsule (20 mg total) by mouth daily. As needed for reflux or indigestion. 42 capsule PRN  . ondansetron (ZOFRAN) 8 MG tablet Take 1 tablet (8 mg total) by mouth every 8 (eight) hours as needed for nausea or vomiting. 30 tablet 0  . OVER THE COUNTER MEDICATION Take 1 tablet by mouth every morning. (Vitamin A)    . pentoxifylline (TRENTAL) 400 MG CR tablet TAKE 1 TABLET BY MOUTH TWICE A DAY (TAKE WITH VITAMIN E 400MG TWICE A DAY)  3  . prochlorperazine (COMPAZINE) 10 MG tablet Take 1 tablet (10 mg total) by mouth every 6 (six) hours as needed for nausea or vomiting. 60 tablet 0  . senna-docusate (SENOKOT-S) 8.6-50 MG tablet Take 1 tablet by mouth daily. 30 tablet prn  No current facility-administered medications for this visit.     SURGICAL HISTORY:  Past Surgical History:  Procedure Laterality Date  . ABDOMINAL HYSTERECTOMY    . COLOSTOMY TAKEDOWN N/A 07/10/2014   Procedure: LAPAROSCOPIC LYSIS OF ADHESIONS (90 MIN) LAPAROSCOPIC ASSISTED COLOSTOMY CLOSURE, RIGID PROCTOSIGMOIDOSCOPY;  Surgeon: Jackolyn Confer, MD;  Location: [facility];  Service: General;  Laterality: N/A;  . LAPAROTOMY N/A 11/03/2013   Procedure: EXPLORATORY LAPAROTOMY, DRAINAGE OF INTRA  ABDOMINAL ABSCESSES, MOBILIZATION OF SPLENIC FLEXURE, SIGMOID COLECTOMY WITH COLOSTOMY;  Surgeon:  Odis Hollingshead, MD;  Location: [facility];  Service: General;  Laterality: N/A;  . VIDEO BRONCHOSCOPY Bilateral 08/30/2013   Procedure: VIDEO BRONCHOSCOPY WITH FLUORO;  Surgeon: Tanda Rockers, MD;  Location: [facility];  Service: Cardiopulmonary;  Laterality: Bilateral;    REVIEW OF SYSTEMS:  A comprehensive review of systems was negative.   PHYSICAL EXAMINATION: General appearance: alert, cooperative and no distress Head: Normocephalic, without obvious abnormality, atraumatic Neck: no adenopathy, no JVD, supple, symmetrical, trachea midline and thyroid not enlarged, symmetric, no tenderness/mass/nodules Lymph nodes: Cervical, supraclavicular, and axillary nodes normal. Resp: clear to auscultation bilaterally Back: symmetric, no curvature. ROM normal. No CVA tenderness. Cardio: regular rate and rhythm, S1, S2 normal, no murmur, click, rub or gallop GI: soft, non-tender; bowel sounds normal; no masses,  no organomegaly Extremities: extremities normal, atraumatic, no cyanosis or edema  ECOG PERFORMANCE STATUS: 0 - Asymptomatic  Blood pressure 95/72, pulse 83, temperature 97.7 F (36.5 C), temperature source Oral, resp. rate 20, weight 174 lb 3.2 oz (79 kg), SpO2 100 %.  LABORATORY DATA: Lab Results  Component Value Date   WBC 7.2 08/14/2017   HGB 11.8 08/14/2017   HCT 35.0 08/14/2017   MCV 98.6 08/14/2017   PLT 293 08/14/2017      Chemistry      Component Value Date/Time   NA 139 07/24/2017 0828   K 4.1 07/24/2017 0828   CL 100 07/12/2014 0512   CO2 23 07/24/2017 0828   BUN 14.5 07/24/2017 0828   CREATININE 0.8 07/24/2017 0828      Component Value Date/Time   CALCIUM 9.7 07/24/2017 0828   ALKPHOS 93 07/24/2017 0828   AST 11 07/24/2017 0828   ALT 15 07/24/2017 0828   BILITOT 0.33 07/24/2017 0828       RADIOGRAPHIC STUDIES: No results found.  ASSESSMENT AND PLAN:  This is a very pleasant 66 years old white female with metastatic non-small cell lung cancer,  adenocarcinoma status post induction systemic chemotherapy with carboplatin and Alimta with partial response. She is currently on maintenance treatment with single agent Alimta status post 55 cycles. The patient continues to tolerate this treatment well with no significant adverse effects. I recommended for her to proceed with cycle #56 today as a schedule. I will see her back for follow-up visit in 3 weeks for evaluation before starting cycle #57 after repeating CT scan of the chest, abdomen and pelvis for restaging of her disease. The patient was advised to call immediately if she has any concerning symptoms in the interval. The patient voices understanding of current disease status and treatment options and is in agreement with the current care plan. All questions were answered. The patient knows to call the clinic with any problems, questions or concerns. We can certainly see the patient much sooner if necessary.  Disclaimer: This note was dictated with voice recognition software. Similar sounding words can inadvertently be transcribed and may not be corrected upon review.

## 2017-08-14 NOTE — Patient Instructions (Signed)
[facility] Discharge Instructions for Patients Receiving Chemotherapy  Today you received the following chemotherapy agents: Alimta   To help prevent nausea and vomiting after your treatment, we encourage you to take your nausea medication as directed.    If you develop nausea and vomiting that is not controlled by your nausea medication, call the clinic.   BELOW ARE SYMPTOMS THAT SHOULD BE REPORTED IMMEDIATELY:  *FEVER GREATER THAN 100.5 F  *CHILLS WITH OR WITHOUT FEVER  NAUSEA AND VOMITING THAT IS NOT CONTROLLED WITH YOUR NAUSEA MEDICATION  *UNUSUAL SHORTNESS OF BREATH  *UNUSUAL BRUISING OR BLEEDING  TENDERNESS IN MOUTH AND THROAT WITH OR WITHOUT PRESENCE OF ULCERS  *URINARY PROBLEMS  *BOWEL PROBLEMS  UNUSUAL RASH Items with * indicate a potential emergency and should be followed up as soon as possible.  Feel free to call the clinic should you have any questions or concerns. The clinic phone number is [phone].  Please show the [facility] at check-in to the Emergency Department and triage nurse.

## 2017-08-20 ENCOUNTER — Emergency Department (HOSPITAL_COMMUNITY)
Admission: EM | Admit: 2017-08-20 | Discharge: 2017-08-20 | Disposition: A | Discharge status: Home Health | Payer: Medicare Other | Attending: Emergency Medicine | Admitting: Emergency Medicine

## 2017-08-20 ENCOUNTER — Encounter (HOSPITAL_COMMUNITY): Payer: Self-pay | Admitting: Emergency Medicine

## 2017-08-20 DIAGNOSIS — Z79899 Other long term (current) drug therapy: Secondary | ICD-10-CM | POA: Diagnosis not present

## 2017-08-20 DIAGNOSIS — C3411 Malignant neoplasm of upper lobe, right bronchus or lung: Secondary | ICD-10-CM | POA: Insufficient documentation

## 2017-08-20 DIAGNOSIS — J069 Acute upper respiratory infection, unspecified: Secondary | ICD-10-CM | POA: Insufficient documentation

## 2017-08-20 DIAGNOSIS — R0981 Nasal congestion: Secondary | ICD-10-CM | POA: Diagnosis present

## 2017-08-20 DIAGNOSIS — J449 Chronic obstructive pulmonary disease, unspecified: Secondary | ICD-10-CM | POA: Diagnosis not present

## 2017-08-20 DIAGNOSIS — Z87891 Personal history of nicotine dependence: Secondary | ICD-10-CM | POA: Insufficient documentation

## 2017-08-20 DIAGNOSIS — R05 Cough: Secondary | ICD-10-CM | POA: Insufficient documentation

## 2017-08-20 MED ORDER — BENZONATATE 100 MG PO CAPS: 100.0000 mg | ORAL_CAPSULE | Freq: Three times a day (TID) | ORAL | 0 refills | Status: DC

## 2017-08-20 NOTE — Discharge Instructions (Signed)
Tessalon for cough. Sudafed and or flonase for congestion.

## 2017-08-20 NOTE — ED Provider Notes (Signed)
[facility] DEPT Provider Note   CSN: [id number] Arrival date & time: 08/20/17  1106     History   Chief Complaint Chief Complaint  Patient presents with  . Cough  . Nasal Congestion    HPI Alexis [name] is a 66 y.o. female. CC: Nasal congestion for 2 days.  HPI: 66 y/o female with ongoing treatment for lung CA, with last tx 7 days ago. Historically, pt without leukopenia, and normal CBC 6 days ago.  No fever. She has nasal congestion and a cough. No significant sinus pain. Sore throat from coughing. No lung congestion shortness of breath or other symptoms.  Past Medical History:  Diagnosis Date  . Anxiety   . Anxiety 06/20/2016  . Cervical cancer ([place])   . Encounter for antineoplastic chemotherapy 07/20/2015  . Malignant neoplasm of right upper lobe of lung (HCC)     non small cell lung cancer adenocarcioma with brain meta    Patient Active Problem List   Diagnosis Date Noted  . 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 Antineoplastic chemotherapy induced anemia 08/23/2016  . Anxiety 06/20/2016  . Encounter for antineoplastic chemotherapy 07/20/2015  . 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 Postoperative visit 12/25/2013  . Ileus, postoperative ([place]) 11/18/2013  . Physical deconditioning 11/18/2013  . Severe protein-calorie malnutrition ([place]) 11/18/2013  . Anemia 11/18/2013  . Leukocytosis, unspecified 11/08/2013  . HCAP (healthcare-associated pneumonia) 11/08/2013  . Pneumoperitoneum 11/03/2013  . Perforation of sigmoid colon ([place]) 11/03/2013  . Primary cancer of right upper lobe of lung ([place]) 11/01/2013  . 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 COPD GOLD II 09/09/2013  . Lung mass 08/28/2013    Past Surgical History:  Procedure Laterality Date  . ABDOMINAL HYSTERECTOMY    . COLOSTOMY TAKEDOWN N/A 07/10/2014   Procedure: LAPAROSCOPIC LYSIS OF ADHESIONS (90 MIN) LAPAROSCOPIC  ASSISTED COLOSTOMY CLOSURE, RIGID PROCTOSIGMOIDOSCOPY;  Surgeon: Jackolyn Confer, MD;  Location: [facility];  Service: General;  Laterality: N/A;  . LAPAROTOMY N/A 11/03/2013   Procedure: EXPLORATORY LAPAROTOMY, DRAINAGE OF INTRA  ABDOMINAL ABSCESSES, MOBILIZATION OF SPLENIC FLEXURE, SIGMOID COLECTOMY WITH COLOSTOMY;  Surgeon: Odis Hollingshead, MD;  Location: [facility];  Service: General;  Laterality: N/A;  . VIDEO BRONCHOSCOPY Bilateral 08/30/2013   Procedure: VIDEO BRONCHOSCOPY WITH FLUORO;  Surgeon: Tanda Rockers, MD;  Location: [facility];  Service: Cardiopulmonary;  Laterality: Bilateral;    OB History    No data available       Home Medications    Prior to Admission medications   Medication Sig Start Date End Date Taking? Authorizing Provider  acetaminophen (TYLENOL) 500 MG tablet Take 500 mg by mouth every 6 (six) hours as needed for mild pain or headache. Reported on 11/02/2015    [provider]  ALPRAZolam Duanne Moron) 0.25 MG tablet Take 1 tablet (0.25 mg total) by mouth 3 (three) times daily as needed. for anxiety 07/24/17   Curt Bears, MD  Ascorbic Acid (VITAMIN C GUMMIE PO) Take 1 each by mouth every morning.    [provider]  benzonatate (TESSALON) 100 MG capsule Take 1 capsule (100 mg total) by mouth every 8 (eight) hours. 08/20/17   Tanna Furry, MD  dexamethasone (DECADRON) 4 MG tablet TAKE 1 TABLET BY MOUTH TWICE A DAY THE DAY BEFORE, DAY OF AND DAY AFTER THE CHEMOTHERAPY EVERY 3 WKS 07/03/17   Curt Bears, MD  folic acid (FOLVITE) 1 MG tablet TAKE 1 TABLET BY  MOUTH EVERY DAY 07/18/17   Curt Bears, MD  Multiple Vitamin (MULTIVITAMIN WITH MINERALS) TABS tablet Take 1 tablet by mouth every morning.    [provider]  omeprazole (PRILOSEC) 20 MG capsule Take 1 capsule (20 mg total) by mouth daily. As needed for reflux or indigestion. 06/11/15   Curt Bears, MD  ondansetron (ZOFRAN) 8 MG tablet Take 1 tablet (8 mg total) by mouth every 8  (eight) hours as needed for nausea or vomiting. 10/31/13   Kyung Rudd, MD  OVER THE COUNTER MEDICATION Take 1 tablet by mouth every morning. (Vitamin A)    [provider]  pentoxifylline (TRENTAL) 400 MG CR tablet TAKE 1 TABLET BY MOUTH TWICE A DAY (TAKE WITH VITAMIN E 400MG  TWICE A DAY) 06/02/17   [provider]  prochlorperazine (COMPAZINE) 10 MG tablet Take 1 tablet (10 mg total) by mouth every 6 (six) hours as needed for nausea or vomiting. 11/23/15   Curt Bears, MD  senna-docusate (SENOKOT-S) 8.6-50 MG tablet Take 1 tablet by mouth daily. 06/11/15   Curt Bears, MD    Family History Family History  Problem Relation Age of Onset  . Emphysema Father        smoked  . Lung cancer Father        smoked  . Cancer Mother   . Hypertension Mother   . COPD Mother     Social History Social History   Tobacco Use  . Smoking status: Former Smoker    Packs/day: 1.00    Years: 40.00    Pack years: 40.00    Types: Cigarettes    Last attempt to quit: 09/27/2013    Years since quitting: 3.8  . Smokeless tobacco: Never Used  Substance Use Topics  . Alcohol use: No  . Drug use: No     Allergies   Codeine   Review of Systems Review of Systems  Constitutional: Negative for appetite change, chills, diaphoresis, fatigue and fever.  HENT: Positive for postnasal drip and sinus pressure. Negative for mouth sores, sore throat and trouble swallowing.   Eyes: Negative for visual disturbance.  Respiratory: Positive for cough. Negative for chest tightness, shortness of breath and wheezing.   Cardiovascular: Negative for chest pain.  Gastrointestinal: Negative for abdominal distention, abdominal pain, diarrhea, nausea and vomiting.  Endocrine: Negative for polydipsia, polyphagia and polyuria.  Genitourinary: Negative for dysuria, frequency and hematuria.  Musculoskeletal: Negative for gait problem.  Skin: Negative for color change, pallor and rash.  Neurological:  Negative for dizziness, syncope, light-headedness and headaches.  Hematological: Does not bruise/bleed easily.  Psychiatric/Behavioral: Negative for behavioral problems and confusion.     Physical Exam Updated Vital Signs BP 135/89   Pulse (!) 115   Temp 98.7 F (37.1 C) (Oral)   Resp 20   Ht 5\' 4"  (1.626 m)   Wt 79.4 kg (175 lb)   SpO2 100%   BMI 30.04 kg/m   Physical Exam  Constitutional: She appears well-developed and well-nourished. No distress.  HENT:  Head: Normocephalic and atraumatic.  Eyes: Conjunctivae are normal.  Neck: Neck supple.  Cardiovascular: Normal rate and regular rhythm.  No murmur heard. Pulmonary/Chest: Effort normal and breath sounds normal. No respiratory distress.  Abdominal: Soft. There is no tenderness.  Musculoskeletal: She exhibits no edema.  Neurological: She is alert.  Skin: Skin is warm and dry.  Psychiatric: She has a normal mood and affect.  Nursing note and vitals reviewed.    ED Treatments / Results  Labs (all labs ordered are listed, but only abnormal results are displayed) Labs Reviewed - No data to display  EKG  EKG Interpretation None       Radiology No results found.  Procedures Procedures (including critical care time)  Medications Ordered in ED Medications - No data to display   Initial Impression / Assessment and Plan / ED Course  I have reviewed the triage vital signs and the nursing notes.  Pertinent labs & imaging results that were available during my care of the patient were reviewed by me and considered in my medical decision making (see chart for details).     Normal exam. Afebrile. Plan expectant management over-the-counter meds for congestion including Flonase and Sudafed or Mucinex. Tessalon for cough. No indication for lab testing. She is not febrile has not had leukopenia and has recent normal CBC.  Final Clinical Impressions(s) / ED Diagnoses   Final diagnoses:  Viral upper respiratory  tract infection    ED Discharge Orders        Ordered    benzonatate (TESSALON) 100 MG capsule  Every 8 hours     08/20/17 1203       Tanna Furry, MD 08/20/17 1208

## 2017-08-20 NOTE — ED Triage Notes (Signed)
Patient c/o productive cough with congestion and headache since Friday. Hx lung cancer. Reports last treatment was last Monday. Denies N/V/D, chest pain and SOB.

## 2017-09-01 ENCOUNTER — Ambulatory Visit (HOSPITAL_COMMUNITY)
Admission: RE | Admit: 2017-09-01 | Discharge: 2017-09-01 | Disposition: A | Discharge status: Home / Self Care | Payer: Medicare Other | Source: Ambulatory Visit | Attending: Internal Medicine | Admitting: Internal Medicine

## 2017-09-01 DIAGNOSIS — J439 Emphysema, unspecified: Secondary | ICD-10-CM | POA: Diagnosis not present

## 2017-09-01 DIAGNOSIS — C3490 Malignant neoplasm of unspecified part of unspecified bronchus or lung: Secondary | ICD-10-CM | POA: Diagnosis not present

## 2017-09-01 DIAGNOSIS — I700 Atherosclerosis of aorta: Secondary | ICD-10-CM | POA: Diagnosis not present

## 2017-09-01 DIAGNOSIS — K429 Umbilical hernia without obstruction or gangrene: Secondary | ICD-10-CM | POA: Diagnosis not present

## 2017-09-01 MED ORDER — IOPAMIDOL (ISOVUE-300) INJECTION 61%
100.0000 mL | Freq: Once | INTRAVENOUS | Status: AC | PRN
  Administered 2017-09-01: 100 mL via INTRAVENOUS

## 2017-09-03 DIAGNOSIS — H6123 Impacted cerumen, bilateral: Secondary | ICD-10-CM | POA: Diagnosis not present

## 2017-09-04 ENCOUNTER — Inpatient Hospital Stay (HOSPITAL_BASED_OUTPATIENT_CLINIC_OR_DEPARTMENT_OTHER): Payer: Medicare Other | Admitting: Internal Medicine

## 2017-09-04 ENCOUNTER — Inpatient Hospital Stay: Payer: Medicare Other

## 2017-09-04 ENCOUNTER — Encounter: Payer: Self-pay | Admitting: Internal Medicine

## 2017-09-04 ENCOUNTER — Inpatient Hospital Stay: Payer: Medicare Other | Attending: Internal Medicine

## 2017-09-04 ENCOUNTER — Encounter: Payer: Self-pay | Admitting: *Deleted

## 2017-09-04 VITALS — BP 129/68 | HR 89 | Temp 98.0°F | Resp 18 | Ht 64.0 in | Wt 174.5 lb

## 2017-09-04 DIAGNOSIS — C7949 Secondary malignant neoplasm of other parts of nervous system: Secondary | ICD-10-CM

## 2017-09-04 DIAGNOSIS — Z923 Personal history of irradiation: Secondary | ICD-10-CM

## 2017-09-04 DIAGNOSIS — C7931 Secondary malignant neoplasm of brain: Secondary | ICD-10-CM

## 2017-09-04 DIAGNOSIS — C3411 Malignant neoplasm of upper lobe, right bronchus or lung: Secondary | ICD-10-CM

## 2017-09-04 DIAGNOSIS — Z5111 Encounter for antineoplastic chemotherapy: Secondary | ICD-10-CM | POA: Insufficient documentation

## 2017-09-04 DIAGNOSIS — J449 Chronic obstructive pulmonary disease, unspecified: Secondary | ICD-10-CM

## 2017-09-04 DIAGNOSIS — F411 Generalized anxiety disorder: Secondary | ICD-10-CM

## 2017-09-04 DIAGNOSIS — Z90710 Acquired absence of both cervix and uterus: Secondary | ICD-10-CM | POA: Insufficient documentation

## 2017-09-04 DIAGNOSIS — D6481 Anemia due to antineoplastic chemotherapy: Secondary | ICD-10-CM

## 2017-09-04 DIAGNOSIS — Z8541 Personal history of malignant neoplasm of cervix uteri: Secondary | ICD-10-CM

## 2017-09-04 DIAGNOSIS — F419 Anxiety disorder, unspecified: Secondary | ICD-10-CM | POA: Insufficient documentation

## 2017-09-04 DIAGNOSIS — Z79899 Other long term (current) drug therapy: Secondary | ICD-10-CM | POA: Diagnosis not present

## 2017-09-04 DIAGNOSIS — T451X5A Adverse effect of antineoplastic and immunosuppressive drugs, initial encounter: Secondary | ICD-10-CM

## 2017-09-04 LAB — COMPREHENSIVE METABOLIC PANEL
ALT: 18 U/L (ref 0–55)
AST: 13 U/L (ref 5–34)
Albumin: 3.5 g/dL (ref 3.5–5.0)
Alkaline Phosphatase: 110 U/L (ref 40–150)
Anion gap: 7 (ref 3–11)
BUN: 11 mg/dL (ref 7–26)
CO2: 27 mmol/L (ref 22–29)
Calcium: 9.7 mg/dL (ref 8.4–10.4)
Chloride: 106 mmol/L (ref 98–109)
Creatinine, Ser: 0.82 mg/dL (ref 0.60–1.10)
GFR calc Af Amer: >60 mL/min (ref >60)
GFR calc non Af Amer: >60 mL/min (ref >60)
Glucose, Bld: 141 mg/dL — ABNORMAL HIGH (ref 70–140)
Potassium: 4.8 mmol/L — ABNORMAL HIGH (ref 3.3–4.7)
Sodium: 140 mmol/L (ref 136–145)
Total Bilirubin: 0.3 mg/dL (ref 0.2–1.2)
Total Protein: 7.2 g/dL (ref 6.4–8.3)

## 2017-09-04 LAB — CBC WITH DIFFERENTIAL/PLATELET
Abs Granulocyte: 8.9 10*3/uL — ABNORMAL HIGH (ref 1.5–6.5)
Basophils Absolute: 0 10*3/uL (ref 0.0–0.1)
Basophils Relative: 0 %
Eosinophils Absolute: 0 10*3/uL (ref 0.0–0.5)
Eosinophils Relative: 0 %
HCT: 36.9 % (ref 34.8–46.6)
Hemoglobin: 12.4 g/dL (ref 11.6–15.9)
Lymphocytes Relative: 4 %
Lymphs Abs: 0.4 10*3/uL — ABNORMAL LOW (ref 0.9–3.3)
MCH: 33.1 pg (ref 25.1–34.0)
MCHC: 33.6 g/dL (ref 31.5–36.0)
MCV: 98.5 fL (ref 79.5–101.0)
Monocytes Absolute: 0.4 10*3/uL (ref 0.1–0.9)
Monocytes Relative: 5 %
Neutro Abs: 8.9 10*3/uL — ABNORMAL HIGH (ref 1.5–6.5)
Neutrophils Relative %: 91 %
Platelets: 322 10*3/uL (ref 145–400)
RBC: 3.75 MIL/uL (ref 3.70–5.45)
RDW: 14.8 % (ref 11.2–16.1)
WBC: 9.7 10*3/uL (ref 3.9–10.3)

## 2017-09-04 MED ORDER — SODIUM CHLORIDE 0.9 % IV SOLN
500.0000 mg/m2 | Freq: Once | INTRAVENOUS | Status: AC
  Administered 2017-09-04: 900 mg via INTRAVENOUS
  Filled 2017-09-04: qty 20

## 2017-09-04 MED ORDER — DEXAMETHASONE SODIUM PHOSPHATE 10 MG/ML IJ SOLN
INTRAMUSCULAR | Status: AC
  Filled 2017-09-04: qty 1

## 2017-09-04 MED ORDER — CYANOCOBALAMIN 1000 MCG/ML IJ SOLN
1000.0000 ug | Freq: Once | INTRAMUSCULAR | Status: AC
  Administered 2017-09-04: 1000 ug via INTRAMUSCULAR

## 2017-09-04 MED ORDER — SODIUM CHLORIDE 0.9 % IV SOLN
Freq: Once | INTRAVENOUS | Status: AC
  Administered 2017-09-04: 12:00:00 via INTRAVENOUS

## 2017-09-04 MED ORDER — CYANOCOBALAMIN 1000 MCG/ML IJ SOLN
INTRAMUSCULAR | Status: AC
  Filled 2017-09-04: qty 1

## 2017-09-04 MED ORDER — ONDANSETRON HCL 8 MG PO TABS
8.0000 mg | ORAL_TABLET | Freq: Once | ORAL | Status: AC
  Administered 2017-09-04: 8 mg via ORAL

## 2017-09-04 MED ORDER — DEXAMETHASONE SODIUM PHOSPHATE 10 MG/ML IJ SOLN
10.0000 mg | Freq: Once | INTRAMUSCULAR | Status: AC
Start: 2017-09-04 — End: 2017-09-04
  Administered 2017-09-04: 10 mg via INTRAVENOUS

## 2017-09-04 MED ORDER — ALPRAZOLAM 0.25 MG PO TABS: 0.2500 mg | ORAL_TABLET | Freq: Three times a day (TID) | ORAL | 0 refills | Status: DC | PRN

## 2017-09-04 MED ORDER — ONDANSETRON HCL 8 MG PO TABS
ORAL_TABLET | ORAL | Status: AC
  Filled 2017-09-04: qty 1

## 2017-09-04 NOTE — Progress Notes (Signed)
[facility] [phone]   [phone]  OFFICE PROGRESS NOTE  Alexis Roers, MD [address] [place] [address]  DIAGNOSIS: Stage IV (T2a, N0, M1b) non-small cell lung cancer, adenocarcinoma with negative EGFR and ALK mutations diagnosed in January 2015 and presented with right upper lobe lung mass in addition to a solitary brain metastasis.  PRIOR THERAPY: 1) Status post stereotactic radiotherapy to a solitary right parietal brain lesion under the care of Dr. [name] on 10/16/2013. 2) Status post palliative radiotherapy to the right lung tumor under the care of Dr. [name] completed on 12/05/2013. 3) Systemic chemotherapy with carboplatin for AUC of 5 and Alimta 500 mg/M2 every 3 weeks. First dose Jan 06 2014. Status post 6 cycles.  CURRENT THERAPY: Systemic chemotherapy with maintenance Alimta 500 MG/M2 every 3 weeks, status post 56 cycles.  INTERVAL HISTORY: Alexis [name] 67 y.o. female returns to the clinic today for follow-up visit accompanied by her husband.  The patient is feeling fine today with no specific complaints.  She was recently treated for nasal congestion and mild bronchitis.  She denied having any current fever or chills.  She has no chest pain, shortness breath, cough or hemoptysis.  She denied having any weight loss or night sweats.  The patient continues to tolerate her treatment with Alimta fairly well.  She is here for evaluation after repeating CT scan of the chest, abdomen and pelvis and before starting cycle #57.   MEDICAL HISTORY: Past Medical History:  Diagnosis Date  . Anxiety   . Anxiety 06/20/2016  . Cervical cancer ([facility])   . Encounter for antineoplastic chemotherapy 07/20/2015  . 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  MEDICATIONS:  Current Outpatient Medications  Medication Sig Dispense Refill  .  acetaminophen (TYLENOL) 500 MG tablet Take 500 mg by mouth every 6 (six) hours as needed for mild pain or headache. Reported on 11/02/2015    . ALPRAZolam (XANAX) 0.25 MG tablet Take 1 tablet (0.25 mg total) by mouth 3 (three) times daily as needed. for anxiety 45 tablet 0  . Ascorbic Acid (VITAMIN C GUMMIE PO) Take 1 each by mouth every morning.    . benzonatate (TESSALON) 100 MG capsule Take 1 capsule (100 mg total) by mouth every 8 (eight) hours. 21 capsule 0  . dexamethasone (DECADRON) 4 MG tablet TAKE 1 TABLET BY MOUTH TWICE A DAY THE DAY BEFORE, DAY OF AND DAY AFTER THE CHEMOTHERAPY EVERY 3 WKS 40 tablet 1  . folic acid (FOLVITE) 1 MG tablet TAKE 1 TABLET BY MOUTH EVERY DAY 90 tablet 0  . Multiple Vitamin (MULTIVITAMIN WITH MINERALS) TABS tablet Take 1 tablet by mouth every morning.    Marland Kitchen omeprazole (PRILOSEC) 20 MG capsule Take 1 capsule (20 mg total) by mouth daily. As needed for reflux or indigestion. 42 capsule PRN  . ondansetron (ZOFRAN) 8 MG tablet Take 1 tablet (8 mg total) by mouth every 8 (eight) hours as needed for nausea or vomiting. 30 tablet 0  . OVER THE COUNTER MEDICATION Take 1 tablet by mouth every morning. (Vitamin A)    . pentoxifylline (TRENTAL) 400 MG CR tablet TAKE 1 TABLET BY MOUTH TWICE A DAY (TAKE WITH VITAMIN E 400MG TWICE A DAY)  3  . prochlorperazine (COMPAZINE) 10 MG tablet Take 1 tablet (10  mg total) by mouth every 6 (six) hours as needed for nausea or vomiting. 60 tablet 0  . senna-docusate (SENOKOT-S) 8.6-50 MG tablet Take 1 tablet by mouth daily. 30 tablet prn   No current facility-administered medications for this visit.     SURGICAL HISTORY:  Past Surgical History:  Procedure Laterality Date  . ABDOMINAL HYSTERECTOMY    . COLOSTOMY TAKEDOWN N/A 07/10/2014   Procedure: LAPAROSCOPIC LYSIS OF ADHESIONS (90 MIN) LAPAROSCOPIC ASSISTED COLOSTOMY CLOSURE, RIGID PROCTOSIGMOIDOSCOPY;  Surgeon: Jackolyn Confer, MD;  Location: [facility];  Service: General;  Laterality:  N/A;  . LAPAROTOMY N/A 11/03/2013   Procedure: EXPLORATORY LAPAROTOMY, DRAINAGE OF INTRA  ABDOMINAL ABSCESSES, MOBILIZATION OF SPLENIC FLEXURE, SIGMOID COLECTOMY WITH COLOSTOMY;  Surgeon: Odis Hollingshead, MD;  Location: [facility];  Service: General;  Laterality: N/A;  . VIDEO BRONCHOSCOPY Bilateral 08/30/2013   Procedure: VIDEO BRONCHOSCOPY WITH FLUORO;  Surgeon: Tanda Rockers, MD;  Location: [facility];  Service: Cardiopulmonary;  Laterality: Bilateral;    REVIEW OF SYSTEMS:  Constitutional: negative Eyes: negative Ears, nose, mouth, throat, and face: negative Respiratory: negative Cardiovascular: negative Gastrointestinal: negative Genitourinary:negative Integument/breast: negative Hematologic/lymphatic: negative Musculoskeletal:negative Neurological: negative Behavioral/Psych: negative Endocrine: negative Allergic/Immunologic: negative   PHYSICAL EXAMINATION: General appearance: alert, cooperative and no distress Head: Normocephalic, without obvious abnormality, atraumatic Neck: no adenopathy, no JVD, supple, symmetrical, trachea midline and thyroid not enlarged, symmetric, no tenderness/mass/nodules Lymph nodes: Cervical, supraclavicular, and axillary nodes normal. Resp: clear to auscultation bilaterally Back: symmetric, no curvature. ROM normal. No CVA tenderness. Cardio: regular rate and rhythm, S1, S2 normal, no murmur, click, rub or gallop GI: soft, non-tender; bowel sounds normal; no masses,  no organomegaly Extremities: extremities normal, atraumatic, no cyanosis or edema Neurologic: Alert and oriented X 3, normal strength and tone. Normal symmetric reflexes. Normal coordination and gait  ECOG PERFORMANCE STATUS: 0 - Asymptomatic  Blood pressure 129/68, pulse 89, temperature 98 F (36.7 C), temperature source Oral, resp. rate 18, height 5' 4"  (1.626 m), weight 174 lb 8 oz (79.2 kg), SpO2 100 %.  LABORATORY DATA: Lab Results  Component Value Date   WBC 9.7 09/04/2017     HGB 12.4 09/04/2017   HCT 36.9 09/04/2017   MCV 98.5 09/04/2017   PLT 322 09/04/2017      Chemistry      Component Value Date/Time   NA 140 09/04/2017 0925   NA 139 08/14/2017 0837   K 4.8 (H) 09/04/2017 0925   K 4.4 08/14/2017 0837   CL 106 09/04/2017 0925   CO2 27 09/04/2017 0925   CO2 24 08/14/2017 0837   BUN 11 09/04/2017 0925   BUN 13.3 08/14/2017 0837   CREATININE 0.82 09/04/2017 0925   CREATININE 0.8 08/14/2017 0837      Component Value Date/Time   CALCIUM 9.7 09/04/2017 0925   CALCIUM 9.3 08/14/2017 0837   ALKPHOS 110 09/04/2017 0925   ALKPHOS 107 08/14/2017 0837   AST 13 09/04/2017 0925   AST 12 08/14/2017 0837   ALT 18 09/04/2017 0925   ALT 12 08/14/2017 0837   BILITOT 0.3 09/04/2017 0925   BILITOT 0.37 08/14/2017 0837       RADIOGRAPHIC STUDIES: Ct Chest W Contrast  Result Date: 09/01/2017 CLINICAL DATA:  Restaging of metastatic right non-small cell lung cancer. EXAM: CT CHEST, ABDOMEN, AND PELVIS WITH CONTRAST TECHNIQUE: Multidetector CT imaging of the chest, abdomen and pelvis was performed following the standard protocol during bolus administration of intravenous contrast. CONTRAST:  128m ISOVUE-300 IOPAMIDOL (ISOVUE-300) INJECTION 61%  COMPARISON:  Serve multiple 06/09/2017 FINDINGS: CT CHEST FINDINGS Cardiovascular: Atherosclerotic calcification in the thoracic aorta and branch vessels. Mediastinum/Nodes: AP window lymph node 0.8 cm in short axis on image 24/2, stable. Right hilar lymph node 0.8 cm in short axis on image 27/2, previously 0.7 cm. No overtly pathologic adenopathy identified. Lungs/Pleura: Continued soft tissue density at the site of the previous treated right apical lung mass. This density is triangular in morphology and associated with volume loss, measuring approximately 3.4 by 3.6 cm on image 34/7. This lesion had a smaller and more oval-shaped morphology back on 08/18/2016. As best I can tell, the patient's most recent radiation therapy was  completed on 12/05/2013. Accordingly the increase in size of the lesion on today' s exam compared to the 2017 exams is not definitely attributable to radiation therapy, and some degree of tumor growth is not readily excluded despite the obvious volume loss and triangular density around the lesion. However, the lesion has not increased in size compared to the most recent prior exam from 05/30/2017. Airway thickening is present, suggesting bronchitis or reactive airways disease. Paraseptal and centrilobular emphysema. Musculoskeletal: Mild sclerosis in the right second, third, and fourth ribs possibly related to prior radiation therapy. No bony destructive findings. CT ABDOMEN PELVIS FINDINGS Hepatobiliary: Unremarkable Pancreas: Unremarkable Spleen: Unremarkable Adrenals/Urinary Tract: Unremarkable Stomach/Bowel: Postoperative findings at the rectosigmoid junction. Vascular/Lymphatic: Aortoiliac atherosclerotic vascular disease. Reproductive: Prior hysterectomy.  Adnexa unremarkable. Other: No supplemental non-categorized findings. Musculoskeletal: Small periumbilical hernias contain omental adipose tissue. IMPRESSION: 1. Essentially stable appearance of treated region of tumor at the right lung apex with triangular density in this vicinity stable from 06/09/2017, although with a greater degree of volume loss compared to examinations from 2017. 2. No new metastatic lesions are identified in the chest, abdomen, or pelvis. 3. Other imaging findings of potential clinical significance: Aortic Atherosclerosis (ICD10-I70.0) and Emphysema (ICD10-J43.9). Small periumbilical hernias contain omental adipose tissue. Airway thickening is present, suggesting bronchitis or reactive airways disease. Electronically Signed   By: Van Clines M.D.   On: 09/01/2017 13:08   Ct Abdomen Pelvis W Contrast  Result Date: 09/01/2017 CLINICAL DATA:  Restaging of metastatic right non-small cell lung cancer. EXAM: CT CHEST, ABDOMEN, AND  PELVIS WITH CONTRAST TECHNIQUE: Multidetector CT imaging of the chest, abdomen and pelvis was performed following the standard protocol during bolus administration of intravenous contrast. CONTRAST:  154m ISOVUE-300 IOPAMIDOL (ISOVUE-300) INJECTION 61% COMPARISON:  Serve multiple 06/09/2017 FINDINGS: CT CHEST FINDINGS Cardiovascular: Atherosclerotic calcification in the thoracic aorta and branch vessels. Mediastinum/Nodes: AP window lymph node 0.8 cm in short axis on image 24/2, stable. Right hilar lymph node 0.8 cm in short axis on image 27/2, previously 0.7 cm. No overtly pathologic adenopathy identified. Lungs/Pleura: Continued soft tissue density at the site of the previous treated right apical lung mass. This density is triangular in morphology and associated with volume loss, measuring approximately 3.4 by 3.6 cm on image 34/7. This lesion had a smaller and more oval-shaped morphology back on 08/18/2016. As best I can tell, the patient's most recent radiation therapy was completed on 12/05/2013. Accordingly the increase in size of the lesion on today' s exam compared to the 2017 exams is not definitely attributable to radiation therapy, and some degree of tumor growth is not readily excluded despite the obvious volume loss and triangular density around the lesion. However, the lesion has not increased in size compared to the most recent prior exam from 05/30/2017. Airway thickening is present, suggesting bronchitis or reactive  airways disease. Paraseptal and centrilobular emphysema. Musculoskeletal: Mild sclerosis in the right second, third, and fourth ribs possibly related to prior radiation therapy. No bony destructive findings. CT ABDOMEN PELVIS FINDINGS Hepatobiliary: Unremarkable Pancreas: Unremarkable Spleen: Unremarkable Adrenals/Urinary Tract: Unremarkable Stomach/Bowel: Postoperative findings at the rectosigmoid junction. Vascular/Lymphatic: Aortoiliac atherosclerotic vascular disease. Reproductive:  Prior hysterectomy.  Adnexa unremarkable. Other: No supplemental non-categorized findings. Musculoskeletal: Small periumbilical hernias contain omental adipose tissue. IMPRESSION: 1. Essentially stable appearance of treated region of tumor at the right lung apex with triangular density in this vicinity stable from 06/09/2017, although with a greater degree of volume loss compared to examinations from 2017. 2. No new metastatic lesions are identified in the chest, abdomen, or pelvis. 3. Other imaging findings of potential clinical significance: Aortic Atherosclerosis (ICD10-I70.0) and Emphysema (ICD10-J43.9). Small periumbilical hernias contain omental adipose tissue. Airway thickening is present, suggesting bronchitis or reactive airways disease. Electronically Signed   By: Van Clines M.D.   On: 09/01/2017 13:08    ASSESSMENT AND PLAN:  This is a very pleasant 67 years old white female with metastatic non-small cell lung cancer, adenocarcinoma status post induction systemic chemotherapy with carboplatin and Alimta with partial response. She is currently on maintenance treatment with single agent Alimta status post 56 cycles. The patient tolerated the last cycle of her treatment well with no significant complaints. Repeat CT scan of the chest, abdomen and pelvis showed no evidence for disease progression.  I personally and independently reviewed the scans and discussed the results with the patient and her husband. I recommended for her to continue her current treatment with maintenance Alimta and she will proceed with cycle #57 today. For the anxiety, I gave her a refill of Xanax. The patient was advised to call immediately if she has any concerning symptoms in the interval. The patient voices understanding of current disease status and treatment options and is in agreement with the current care plan. All questions were answered. The patient knows to call the clinic with any problems, questions or  concerns. We can certainly see the patient much sooner if necessary.  Disclaimer: This note was dictated with voice recognition software. Similar sounding words can inadvertently be transcribed and may not be corrected upon review.

## 2017-09-04 NOTE — Patient Instructions (Signed)
[facility] Discharge Instructions for Patients Receiving Chemotherapy  Today you received the following chemotherapy agents Alimta  To help prevent nausea and vomiting after your treatment, we encourage you to take your nausea medication as directed.   If you develop nausea and vomiting that is not controlled by your nausea medication, call the clinic.   BELOW ARE SYMPTOMS THAT SHOULD BE REPORTED IMMEDIATELY:  *FEVER GREATER THAN 100.5 F  *CHILLS WITH OR WITHOUT FEVER  NAUSEA AND VOMITING THAT IS NOT CONTROLLED WITH YOUR NAUSEA MEDICATION  *UNUSUAL SHORTNESS OF BREATH  *UNUSUAL BRUISING OR BLEEDING  TENDERNESS IN MOUTH AND THROAT WITH OR WITHOUT PRESENCE OF ULCERS  *URINARY PROBLEMS  *BOWEL PROBLEMS  UNUSUAL RASH Items with * indicate a potential emergency and should be followed up as soon as possible.  Feel free to call the clinic should you have any questions or concerns. The clinic phone number is [phone].  Please show the [facility] at check-in to the Emergency Department and triage nurse.

## 2017-09-04 NOTE — Progress Notes (Signed)
Oncology Nurse Navigator Documentation  Oncology Nurse Navigator Flowsheets 09/04/2017  Navigator Location [facility]  Navigator Encounter Type Clinic/MDC/I spoke with patient today at [facility].  She had follow up scan. She told me she had good results and will continue on treatment. No barriers identified at this time.   Treatment Phase Treatment  Barriers/Navigation Needs No barriers at this time  Interventions None required  Acuity Level 1  Time Spent with Patient 15

## 2017-09-06 ENCOUNTER — Telehealth: Payer: Self-pay | Admitting: Internal Medicine

## 2017-09-06 NOTE — Telephone Encounter (Signed)
Scheduled appt per 1/7 los - Patient to get an updated schedule next visit.

## 2017-09-25 ENCOUNTER — Encounter: Payer: Self-pay | Admitting: Internal Medicine

## 2017-09-25 ENCOUNTER — Inpatient Hospital Stay: Payer: Medicare Other

## 2017-09-25 ENCOUNTER — Other Ambulatory Visit: Payer: Self-pay | Admitting: Internal Medicine

## 2017-09-25 ENCOUNTER — Inpatient Hospital Stay (HOSPITAL_BASED_OUTPATIENT_CLINIC_OR_DEPARTMENT_OTHER): Payer: Medicare Other | Admitting: Internal Medicine

## 2017-09-25 ENCOUNTER — Telehealth: Payer: Self-pay | Admitting: Internal Medicine

## 2017-09-25 VITALS — BP 103/55 | HR 93 | Temp 98.3°F | Resp 18 | Ht 64.0 in | Wt 174.3 lb

## 2017-09-25 DIAGNOSIS — C3411 Malignant neoplasm of upper lobe, right bronchus or lung: Secondary | ICD-10-CM

## 2017-09-25 DIAGNOSIS — Z79899 Other long term (current) drug therapy: Secondary | ICD-10-CM | POA: Diagnosis not present

## 2017-09-25 DIAGNOSIS — Z923 Personal history of irradiation: Secondary | ICD-10-CM

## 2017-09-25 DIAGNOSIS — Z8541 Personal history of malignant neoplasm of cervix uteri: Secondary | ICD-10-CM | POA: Diagnosis not present

## 2017-09-25 DIAGNOSIS — C3490 Malignant neoplasm of unspecified part of unspecified bronchus or lung: Secondary | ICD-10-CM

## 2017-09-25 DIAGNOSIS — Z90710 Acquired absence of both cervix and uterus: Secondary | ICD-10-CM

## 2017-09-25 DIAGNOSIS — Z5111 Encounter for antineoplastic chemotherapy: Secondary | ICD-10-CM | POA: Diagnosis not present

## 2017-09-25 DIAGNOSIS — C7931 Secondary malignant neoplasm of brain: Secondary | ICD-10-CM | POA: Diagnosis not present

## 2017-09-25 LAB — CBC WITH DIFFERENTIAL/PLATELET
Basophils Absolute: 0 10*3/uL (ref 0.0–0.1)
Basophils Relative: 0 %
Eosinophils Absolute: 0 10*3/uL (ref 0.0–0.5)
Eosinophils Relative: 0 %
HCT: 38 % (ref 34.8–46.6)
Hemoglobin: 12.5 g/dL (ref 11.6–15.9)
Lymphocytes Relative: 4 %
Lymphs Abs: 0.4 10*3/uL — ABNORMAL LOW (ref 0.9–3.3)
MCH: 32.4 pg (ref 25.1–34.0)
MCHC: 33 g/dL (ref 31.5–36.0)
MCV: 98.1 fL (ref 79.5–101.0)
Monocytes Absolute: 0.7 10*3/uL (ref 0.1–0.9)
Monocytes Relative: 8 %
Neutro Abs: 7 10*3/uL — ABNORMAL HIGH (ref 1.5–6.5)
Neutrophils Relative %: 88 %
Platelets: 267 10*3/uL (ref 145–400)
RBC: 3.87 MIL/uL (ref 3.70–5.45)
RDW: 14.3 % (ref 11.2–16.1)
WBC: 8 10*3/uL (ref 3.9–10.3)

## 2017-09-25 LAB — COMPREHENSIVE METABOLIC PANEL
ALT: 11 U/L (ref 0–55)
AST: 12 U/L (ref 5–34)
Albumin: 3.6 g/dL (ref 3.5–5.0)
Alkaline Phosphatase: 117 U/L (ref 40–150)
Anion gap: 8 (ref 3–11)
BUN: 17 mg/dL (ref 7–26)
CO2: 26 mmol/L (ref 22–29)
Calcium: 9.8 mg/dL (ref 8.4–10.4)
Chloride: 105 mmol/L (ref 98–109)
Creatinine, Ser: 0.85 mg/dL (ref 0.60–1.10)
GFR calc Af Amer: >60 mL/min (ref >60)
GFR calc non Af Amer: >60 mL/min (ref >60)
Glucose, Bld: 128 mg/dL (ref 70–140)
Potassium: 4.1 mmol/L (ref 3.3–4.7)
Sodium: 139 mmol/L (ref 136–145)
Total Bilirubin: 0.5 mg/dL (ref 0.2–1.2)
Total Protein: 7.3 g/dL (ref 6.4–8.3)

## 2017-09-25 MED ORDER — ONDANSETRON HCL 8 MG PO TABS
8.0000 mg | ORAL_TABLET | Freq: Once | ORAL | Status: AC
  Administered 2017-09-25: 8 mg via ORAL

## 2017-09-25 MED ORDER — DEXAMETHASONE SODIUM PHOSPHATE 10 MG/ML IJ SOLN
10.0000 mg | Freq: Once | INTRAMUSCULAR | Status: AC
  Administered 2017-09-25: 10 mg via INTRAVENOUS

## 2017-09-25 MED ORDER — DEXAMETHASONE SODIUM PHOSPHATE 10 MG/ML IJ SOLN
INTRAMUSCULAR | Status: AC
  Filled 2017-09-25: qty 1

## 2017-09-25 MED ORDER — SODIUM CHLORIDE 0.9 % IV SOLN
Freq: Once | INTRAVENOUS | Status: AC
  Administered 2017-09-25: 10:00:00 via INTRAVENOUS

## 2017-09-25 MED ORDER — ONDANSETRON HCL 8 MG PO TABS
ORAL_TABLET | ORAL | Status: AC
  Filled 2017-09-25: qty 1

## 2017-09-25 MED ORDER — SODIUM CHLORIDE 0.9 % IV SOLN
500.0000 mg/m2 | Freq: Once | INTRAVENOUS | Status: AC
  Administered 2017-09-25: 900 mg via INTRAVENOUS
  Filled 2017-09-25: qty 20

## 2017-09-25 NOTE — Progress Notes (Signed)
[facility] [phone]   [phone]  OFFICE PROGRESS NOTE  [name], MD [address] [place] [address]  DIAGNOSIS: Stage IV (T2a, N0, M1b) non-small cell lung cancer, adenocarcinoma with negative EGFR and ALK mutations diagnosed in January 2015 and presented with right upper lobe lung mass in addition to a solitary brain metastasis.  PRIOR THERAPY: 1) Status post stereotactic radiotherapy to a solitary right parietal brain lesion under the care of Dr. [name] on 10/16/2013. 2) Status post palliative radiotherapy to the right lung tumor under the care of Dr. [name] completed on 12/05/2013. 3) Systemic chemotherapy with carboplatin for AUC of 5 and Alimta 500 mg/M2 every 3 weeks. First dose Jan 06 2014. Status post 6 cycles.  CURRENT THERAPY: Systemic chemotherapy with maintenance Alimta 500 MG/M2 every 3 weeks, status post 57 cycles.  INTERVAL HISTORY: Alexis [name] 67 y.o. female returns to the clinic today for follow-up visit accompanied by her husband.  The patient is feeling fine today with no specific complaints.  She tolerated the last cycle of her treatment fairly well.  She denied having any significant chest pain, shortness of breath, cough or hemoptysis.  She denied having any fever or chills.  She has no nausea, vomiting, diarrhea or constipation.  She is traveling to [place] for vacation next week.  She is here today for evaluation before starting cycle #58.   MEDICAL HISTORY: Past Medical History:  Diagnosis Date  . Anxiety   . Anxiety 06/20/2016  . Cervical cancer ([place])   . Encounter for antineoplastic chemotherapy 07/20/2015  . 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  MEDICATIONS:  Current Outpatient Medications  Medication Sig Dispense Refill  . acetaminophen (TYLENOL) 500 MG tablet Take 500 mg by mouth  every 6 (six) hours as needed for mild pain or headache. Reported on 11/02/2015    . ALPRAZolam (XANAX) 0.25 MG tablet Take 1 tablet (0.25 mg total) by mouth 3 (three) times daily as needed. for anxiety 45 tablet 0  . Ascorbic Acid (VITAMIN C GUMMIE PO) Take 1 each by mouth every morning.    . benzonatate (TESSALON) 100 MG capsule Take 1 capsule (100 mg total) by mouth every 8 (eight) hours. 21 capsule 0  . dexamethasone (DECADRON) 4 MG tablet TAKE 1 TABLET BY MOUTH TWICE A DAY THE DAY BEFORE, DAY OF AND DAY AFTER THE CHEMOTHERAPY EVERY 3 WKS 40 tablet 1  . folic acid (FOLVITE) 1 MG tablet TAKE 1 TABLET BY MOUTH EVERY DAY 90 tablet 0  . Multiple Vitamin (MULTIVITAMIN WITH MINERALS) TABS tablet Take 1 tablet by mouth every morning.    Marland Kitchen omeprazole (PRILOSEC) 20 MG capsule Take 1 capsule (20 mg total) by mouth daily. As needed for reflux or indigestion. 42 capsule PRN  . ondansetron (ZOFRAN) 8 MG tablet Take 1 tablet (8 mg total) by mouth every 8 (eight) hours as needed for nausea or vomiting. 30 tablet 0  . OVER THE COUNTER MEDICATION Take 1 tablet by mouth every morning. (Vitamin A)    . pentoxifylline (TRENTAL) 400 MG CR tablet TAKE 1 TABLET BY MOUTH TWICE A DAY (TAKE WITH VITAMIN E 400MG TWICE A DAY)  3  . prochlorperazine (COMPAZINE) 10 MG tablet Take 1 tablet (10 mg total) by mouth every 6 (six) hours as needed for  nausea or vomiting. 60 tablet 0  . senna-docusate (SENOKOT-S) 8.6-50 MG tablet Take 1 tablet by mouth daily. 30 tablet prn   No current facility-administered medications for this visit.     SURGICAL HISTORY:  Past Surgical History:  Procedure Laterality Date  . ABDOMINAL HYSTERECTOMY    . COLOSTOMY TAKEDOWN N/A 07/10/2014   Procedure: LAPAROSCOPIC LYSIS OF ADHESIONS (90 MIN) LAPAROSCOPIC ASSISTED COLOSTOMY CLOSURE, RIGID PROCTOSIGMOIDOSCOPY;  Surgeon: Jackolyn Confer, MD;  Location: [facility];  Service: General;  Laterality: N/A;  . LAPAROTOMY N/A 11/03/2013   Procedure: EXPLORATORY  LAPAROTOMY, DRAINAGE OF INTRA  ABDOMINAL ABSCESSES, MOBILIZATION OF SPLENIC FLEXURE, SIGMOID COLECTOMY WITH COLOSTOMY;  Surgeon: Odis Hollingshead, MD;  Location: [facility];  Service: General;  Laterality: N/A;  . VIDEO BRONCHOSCOPY Bilateral 08/30/2013   Procedure: VIDEO BRONCHOSCOPY WITH FLUORO;  Surgeon: Tanda Rockers, MD;  Location: [facility];  Service: Cardiopulmonary;  Laterality: Bilateral;    REVIEW OF SYSTEMS:  A comprehensive review of systems was negative.   PHYSICAL EXAMINATION: General appearance: alert, cooperative and no distress Head: Normocephalic, without obvious abnormality, atraumatic Neck: no adenopathy, no JVD, supple, symmetrical, trachea midline and thyroid not enlarged, symmetric, no tenderness/mass/nodules Lymph nodes: Cervical, supraclavicular, and axillary nodes normal. Resp: clear to auscultation bilaterally Back: symmetric, no curvature. ROM normal. No CVA tenderness. Cardio: regular rate and rhythm, S1, S2 normal, no murmur, click, rub or gallop GI: soft, non-tender; bowel sounds normal; no masses,  no organomegaly Extremities: extremities normal, atraumatic, no cyanosis or edema  ECOG PERFORMANCE STATUS: 0 - Asymptomatic  Blood pressure (!) 103/55, pulse 93, temperature 98.3 F (36.8 C), temperature source Oral, resp. rate 18, height _0  (1.626 m), weight 174 lb 4.8 oz (79.1 kg), SpO2 100 %.  LABORATORY DATA: Lab Results  Component Value Date   WBC 9.7 09/04/2017   HGB 12.4 09/04/2017   HCT 36.9 09/04/2017   MCV 98.5 09/04/2017   PLT 322 09/04/2017      Chemistry      Component Value Date/Time   NA 140 09/04/2017 0925   NA 139 08/14/2017 0837   K 4.8 (H) 09/04/2017 0925   K 4.4 08/14/2017 0837   CL 106 09/04/2017 0925   CO2 27 09/04/2017 0925   CO2 24 08/14/2017 0837   BUN 11 09/04/2017 0925   BUN 13.3 08/14/2017 0837   CREATININE 0.82 09/04/2017 0925   CREATININE 0.8 08/14/2017 0837      Component Value Date/Time   CALCIUM 9.7  09/04/2017 0925   CALCIUM 9.3 08/14/2017 0837   ALKPHOS 110 09/04/2017 0925   ALKPHOS 107 08/14/2017 0837   AST 13 09/04/2017 0925   AST 12 08/14/2017 0837   ALT 18 09/04/2017 0925   ALT 12 08/14/2017 0837   BILITOT 0.3 09/04/2017 0925   BILITOT 0.37 08/14/2017 0837       RADIOGRAPHIC STUDIES: Ct Chest W Contrast  Result Date: 09/01/2017 CLINICAL DATA:  Restaging of metastatic right non-small cell lung cancer. EXAM: CT CHEST, ABDOMEN, AND PELVIS WITH CONTRAST TECHNIQUE: Multidetector CT imaging of the chest, abdomen and pelvis was performed following the standard protocol during bolus administration of intravenous contrast. CONTRAST:  144m ISOVUE-300 IOPAMIDOL (ISOVUE-300) INJECTION 61% COMPARISON:  Serve multiple 06/09/2017 FINDINGS: CT CHEST FINDINGS Cardiovascular: Atherosclerotic calcification in the thoracic aorta and branch vessels. Mediastinum/Nodes: AP window lymph node 0.8 cm in short axis on image 24/2, stable. Right hilar lymph node 0.8 cm in short axis on image 27/2, previously 0.7 cm. No overtly pathologic adenopathy  identified. Lungs/Pleura: Continued soft tissue density at the site of the previous treated right apical lung mass. This density is triangular in morphology and associated with volume loss, measuring approximately 3.4 by 3.6 cm on image 34/7. This lesion had a smaller and more oval-shaped morphology back on 08/18/2016. As best I can tell, the patient's most recent radiation therapy was completed on 12/05/2013. Accordingly the increase in size of the lesion on today' s exam compared to the 2017 exams is not definitely attributable to radiation therapy, and some degree of tumor growth is not readily excluded despite the obvious volume loss and triangular density around the lesion. However, the lesion has not increased in size compared to the most recent prior exam from 05/30/2017. Airway thickening is present, suggesting bronchitis or reactive airways disease. Paraseptal and  centrilobular emphysema. Musculoskeletal: Mild sclerosis in the right second, third, and fourth ribs possibly related to prior radiation therapy. No bony destructive findings. CT ABDOMEN PELVIS FINDINGS Hepatobiliary: Unremarkable Pancreas: Unremarkable Spleen: Unremarkable Adrenals/Urinary Tract: Unremarkable Stomach/Bowel: Postoperative findings at the rectosigmoid junction. Vascular/Lymphatic: Aortoiliac atherosclerotic vascular disease. Reproductive: Prior hysterectomy.  Adnexa unremarkable. Other: No supplemental non-categorized findings. Musculoskeletal: Small periumbilical hernias contain omental adipose tissue. IMPRESSION: 1. Essentially stable appearance of treated region of tumor at the right lung apex with triangular density in this vicinity stable from 06/09/2017, although with a greater degree of volume loss compared to examinations from 2017. 2. No new metastatic lesions are identified in the chest, abdomen, or pelvis. 3. Other imaging findings of potential clinical significance: Aortic Atherosclerosis (ICD10-I70.0) and Emphysema (ICD10-J43.9). Small periumbilical hernias contain omental adipose tissue. Airway thickening is present, suggesting bronchitis or reactive airways disease. Electronically Signed   By: Van Clines M.D.   On: 09/01/2017 13:08   Ct Abdomen Pelvis W Contrast  Result Date: 09/01/2017 CLINICAL DATA:  Restaging of metastatic right non-small cell lung cancer. EXAM: CT CHEST, ABDOMEN, AND PELVIS WITH CONTRAST TECHNIQUE: Multidetector CT imaging of the chest, abdomen and pelvis was performed following the standard protocol during bolus administration of intravenous contrast. CONTRAST:  116m ISOVUE-300 IOPAMIDOL (ISOVUE-300) INJECTION 61% COMPARISON:  Serve multiple 06/09/2017 FINDINGS: CT CHEST FINDINGS Cardiovascular: Atherosclerotic calcification in the thoracic aorta and branch vessels. Mediastinum/Nodes: AP window lymph node 0.8 cm in short axis on image 24/2, stable.  Right hilar lymph node 0.8 cm in short axis on image 27/2, previously 0.7 cm. No overtly pathologic adenopathy identified. Lungs/Pleura: Continued soft tissue density at the site of the previous treated right apical lung mass. This density is triangular in morphology and associated with volume loss, measuring approximately 3.4 by 3.6 cm on image 34/7. This lesion had a smaller and more oval-shaped morphology back on 08/18/2016. As best I can tell, the patient's most recent radiation therapy was completed on 12/05/2013. Accordingly the increase in size of the lesion on today' s exam compared to the 2017 exams is not definitely attributable to radiation therapy, and some degree of tumor growth is not readily excluded despite the obvious volume loss and triangular density around the lesion. However, the lesion has not increased in size compared to the most recent prior exam from 05/30/2017. Airway thickening is present, suggesting bronchitis or reactive airways disease. Paraseptal and centrilobular emphysema. Musculoskeletal: Mild sclerosis in the right second, third, and fourth ribs possibly related to prior radiation therapy. No bony destructive findings. CT ABDOMEN PELVIS FINDINGS Hepatobiliary: Unremarkable Pancreas: Unremarkable Spleen: Unremarkable Adrenals/Urinary Tract: Unremarkable Stomach/Bowel: Postoperative findings at the rectosigmoid junction. Vascular/Lymphatic: Aortoiliac atherosclerotic vascular disease.  Reproductive: Prior hysterectomy.  Adnexa unremarkable. Other: No supplemental non-categorized findings. Musculoskeletal: Small periumbilical hernias contain omental adipose tissue. IMPRESSION: 1. Essentially stable appearance of treated region of tumor at the right lung apex with triangular density in this vicinity stable from 06/09/2017, although with a greater degree of volume loss compared to examinations from 2017. 2. No new metastatic lesions are identified in the chest, abdomen, or pelvis. 3.  Other imaging findings of potential clinical significance: Aortic Atherosclerosis (ICD10-I70.0) and Emphysema (ICD10-J43.9). Small periumbilical hernias contain omental adipose tissue. Airway thickening is present, suggesting bronchitis or reactive airways disease. Electronically Signed   By: Van Clines M.D.   On: 09/01/2017 13:08    ASSESSMENT AND PLAN:  This is a very pleasant 67 years old white female with metastatic non-small cell lung cancer, adenocarcinoma status post induction systemic chemotherapy with carboplatin and Alimta with partial response. She is currently on maintenance treatment with single agent Alimta status post 57 cycles. She continues to tolerate this treatment fairly well with no concerning complaints. I recommended for her to proceed with cycle #58 today as a scheduled. I will see her back for follow-up visit in 3 weeks for evaluation before cycle #59. The patient was advised to call immediately if she has any concerning symptoms in the interval. The patient voices understanding of current disease status and treatment options and is in agreement with the current care plan. All questions were answered. The patient knows to call the clinic with any problems, questions or concerns. We can certainly see the patient much sooner if necessary.  Disclaimer: This note was dictated with voice recognition software. Similar sounding words can inadvertently be transcribed and may not be corrected upon review.

## 2017-09-25 NOTE — Telephone Encounter (Signed)
Gave avs and calendar for april °

## 2017-09-25 NOTE — Patient Instructions (Signed)
[facility] Discharge Instructions for Patients Receiving Chemotherapy  Today you received the following chemotherapy agents Alimta  To help prevent nausea and vomiting after your treatment, we encourage you to take your nausea medication as directed.   If you develop nausea and vomiting that is not controlled by your nausea medication, call the clinic.   BELOW ARE SYMPTOMS THAT SHOULD BE REPORTED IMMEDIATELY:  *FEVER GREATER THAN 100.5 F  *CHILLS WITH OR WITHOUT FEVER  NAUSEA AND VOMITING THAT IS NOT CONTROLLED WITH YOUR NAUSEA MEDICATION  *UNUSUAL SHORTNESS OF BREATH  *UNUSUAL BRUISING OR BLEEDING  TENDERNESS IN MOUTH AND THROAT WITH OR WITHOUT PRESENCE OF ULCERS  *URINARY PROBLEMS  *BOWEL PROBLEMS  UNUSUAL RASH Items with * indicate a potential emergency and should be followed up as soon as possible.  Feel free to call the clinic should you have any questions or concerns. The clinic phone number is [phone].  Please show the [facility] at check-in to the Emergency Department and triage nurse.

## 2017-10-05 ENCOUNTER — Other Ambulatory Visit: Payer: Self-pay | Admitting: Radiation Therapy

## 2017-10-05 DIAGNOSIS — C7931 Secondary malignant neoplasm of brain: Secondary | ICD-10-CM

## 2017-10-05 DIAGNOSIS — C7949 Secondary malignant neoplasm of other parts of nervous system: Principal | ICD-10-CM

## 2017-10-16 ENCOUNTER — Inpatient Hospital Stay: Payer: Medicare Other | Attending: Internal Medicine | Admitting: Internal Medicine

## 2017-10-16 ENCOUNTER — Inpatient Hospital Stay: Payer: Medicare Other

## 2017-10-16 ENCOUNTER — Telehealth: Payer: Self-pay | Admitting: Internal Medicine

## 2017-10-16 ENCOUNTER — Encounter: Payer: Self-pay | Admitting: Internal Medicine

## 2017-10-16 VITALS — BP 128/76 | HR 105 | Temp 98.0°F | Resp 18 | Ht 64.0 in | Wt 176.9 lb

## 2017-10-16 VITALS — HR 88

## 2017-10-16 DIAGNOSIS — C7931 Secondary malignant neoplasm of brain: Secondary | ICD-10-CM | POA: Diagnosis not present

## 2017-10-16 DIAGNOSIS — C7949 Secondary malignant neoplasm of other parts of nervous system: Secondary | ICD-10-CM

## 2017-10-16 DIAGNOSIS — Z8541 Personal history of malignant neoplasm of cervix uteri: Secondary | ICD-10-CM | POA: Diagnosis not present

## 2017-10-16 DIAGNOSIS — Z79899 Other long term (current) drug therapy: Secondary | ICD-10-CM | POA: Diagnosis not present

## 2017-10-16 DIAGNOSIS — C3411 Malignant neoplasm of upper lobe, right bronchus or lung: Secondary | ICD-10-CM

## 2017-10-16 DIAGNOSIS — F411 Generalized anxiety disorder: Secondary | ICD-10-CM

## 2017-10-16 DIAGNOSIS — Z5112 Encounter for antineoplastic immunotherapy: Secondary | ICD-10-CM | POA: Insufficient documentation

## 2017-10-16 DIAGNOSIS — F419 Anxiety disorder, unspecified: Secondary | ICD-10-CM | POA: Diagnosis not present

## 2017-10-16 LAB — CBC WITH DIFFERENTIAL/PLATELET
Basophils Absolute: 0 10*3/uL (ref 0.0–0.1)
Basophils Relative: 0 %
Eosinophils Absolute: 0 10*3/uL (ref 0.0–0.5)
Eosinophils Relative: 0 %
HCT: 37.7 % (ref 34.8–46.6)
Hemoglobin: 12.1 g/dL (ref 11.6–15.9)
Lymphocytes Relative: 6 %
Lymphs Abs: 0.4 10*3/uL — ABNORMAL LOW (ref 0.9–3.3)
MCH: 32.5 pg (ref 25.1–34.0)
MCHC: 32.1 g/dL (ref 31.5–36.0)
MCV: 101.3 fL — ABNORMAL HIGH (ref 79.5–101.0)
Monocytes Absolute: 0.6 10*3/uL (ref 0.1–0.9)
Monocytes Relative: 9 %
Neutro Abs: 5.5 10*3/uL (ref 1.5–6.5)
Neutrophils Relative %: 85 %
Platelets: 321 10*3/uL (ref 145–400)
RBC: 3.72 MIL/uL (ref 3.70–5.45)
RDW: 15.3 % — ABNORMAL HIGH (ref 11.2–14.5)
WBC: 6.4 10*3/uL (ref 3.9–10.3)

## 2017-10-16 LAB — COMPREHENSIVE METABOLIC PANEL
ALT: 10 U/L (ref 0–55)
AST: 11 U/L (ref 5–34)
Albumin: 3.2 g/dL — ABNORMAL LOW (ref 3.5–5.0)
Alkaline Phosphatase: 93 U/L (ref 40–150)
Anion gap: 10 (ref 3–11)
BUN: 9 mg/dL (ref 7–26)
CO2: 22 mmol/L (ref 22–29)
Calcium: 9.7 mg/dL (ref 8.4–10.4)
Chloride: 108 mmol/L (ref 98–109)
Creatinine, Ser: 0.74 mg/dL (ref 0.60–1.10)
GFR calc Af Amer: >60 mL/min (ref >60)
GFR calc non Af Amer: >60 mL/min (ref >60)
Glucose, Bld: 157 mg/dL — ABNORMAL HIGH (ref 70–140)
Potassium: 3.8 mmol/L (ref 3.5–5.1)
Sodium: 140 mmol/L (ref 136–145)
Total Bilirubin: 0.5 mg/dL (ref 0.2–1.2)
Total Protein: 6.9 g/dL (ref 6.4–8.3)

## 2017-10-16 MED ORDER — DEXAMETHASONE SODIUM PHOSPHATE 10 MG/ML IJ SOLN
INTRAMUSCULAR | Status: AC
  Filled 2017-10-16: qty 1

## 2017-10-16 MED ORDER — ALPRAZOLAM 0.25 MG PO TABS: 0.2500 mg | ORAL_TABLET | Freq: Three times a day (TID) | ORAL | 0 refills | Status: DC | PRN

## 2017-10-16 MED ORDER — ONDANSETRON HCL 8 MG PO TABS
ORAL_TABLET | ORAL | Status: AC
  Filled 2017-10-16: qty 1

## 2017-10-16 MED ORDER — SODIUM CHLORIDE 0.9 % IV SOLN
Freq: Once | INTRAVENOUS | Status: AC
Start: 2017-10-16 — End: 2017-10-16
  Administered 2017-10-16: 09:00:00 via INTRAVENOUS

## 2017-10-16 MED ORDER — ONDANSETRON HCL 8 MG PO TABS
8.0000 mg | ORAL_TABLET | Freq: Once | ORAL | Status: AC
  Administered 2017-10-16: 8 mg via ORAL

## 2017-10-16 MED ORDER — DEXAMETHASONE SODIUM PHOSPHATE 10 MG/ML IJ SOLN
10.0000 mg | Freq: Once | INTRAMUSCULAR | Status: AC
Start: 2017-10-16 — End: 2017-10-16
  Administered 2017-10-16: 10 mg via INTRAVENOUS

## 2017-10-16 MED ORDER — PEMETREXED DISODIUM CHEMO INJECTION 500 MG
500.0000 mg/m2 | Freq: Once | INTRAVENOUS | Status: AC
  Administered 2017-10-16: 900 mg via INTRAVENOUS
  Filled 2017-10-16: qty 20

## 2017-10-16 NOTE — Telephone Encounter (Signed)
appts already scheduled per 2/18 los - no additional appts to add.

## 2017-10-16 NOTE — Patient Instructions (Signed)
[name] Discharge Instructions for Patients Receiving Chemotherapy  Today you received the following chemotherapy agents Alimta  To help prevent nausea and vomiting after your treatment, we encourage you to take your nausea medication as directed  If you develop nausea and vomiting that is not controlled by your nausea medication, call the clinic.   BELOW ARE SYMPTOMS THAT SHOULD BE REPORTED IMMEDIATELY:  *FEVER GREATER THAN 100.5 F  *CHILLS WITH OR WITHOUT FEVER  NAUSEA AND VOMITING THAT IS NOT CONTROLLED WITH YOUR NAUSEA MEDICATION  *UNUSUAL SHORTNESS OF BREATH  *UNUSUAL BRUISING OR BLEEDING  TENDERNESS IN MOUTH AND THROAT WITH OR WITHOUT PRESENCE OF ULCERS  *URINARY PROBLEMS  *BOWEL PROBLEMS  UNUSUAL RASH Items with * indicate a potential emergency and should be followed up as soon as possible.  Feel free to call the clinic should you have any questions or concerns. The clinic phone number is [phone].  Please show the [facility] at check-in to the Emergency Department and triage nurse.

## 2017-10-16 NOTE — Progress Notes (Signed)
[facility] [phone]   [phone]  OFFICE PROGRESS NOTE  [name], MD [address] [place] [address]  DIAGNOSIS: Stage IV (T2a, N0, M1b) non-small cell lung cancer, adenocarcinoma with negative EGFR and ALK mutations diagnosed in January 2015 and presented with right upper lobe lung mass in addition to a solitary brain metastasis.  PRIOR THERAPY: 1) Status post stereotactic radiotherapy to a solitary right parietal brain lesion under the care of Dr. [name] on 10/16/2013. 2) Status post palliative radiotherapy to the right lung tumor under the care of Dr. [name] completed on 12/05/2013. 3) Systemic chemotherapy with carboplatin for AUC of 5 and Alimta 500 mg/M2 every 3 weeks. First dose Jan 06 2014. Status post 6 cycles.  CURRENT THERAPY: Systemic chemotherapy with maintenance Alimta 500 MG/M2 every 3 weeks, status post 58 cycles.  INTERVAL HISTORY: Alexis [name] 67 y.o. female returns to the clinic today for follow-up visit accompanied by her husband.  The patient is feeling fine today with no specific complaints except for swelling in her lower extremities after long flight to [place].  She enjoyed her time here and celebrated her 66th birthday.  The patient denied having any chest pain, shortness of breath, cough or hemoptysis.  She denied having any fever or chills.  She has no nausea, vomiting, diarrhea or constipation.  She denied having any recent weight loss or night sweats.  She is here today for evaluation before starting cycle #59.  MEDICAL HISTORY: Past Medical History:  Diagnosis Date  . Anxiety   . Anxiety 06/20/2016  . Cervical cancer ([place])   . Encounter for antineoplastic chemotherapy 07/20/2015  . 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  MEDICATIONS:  Current Outpatient Medications  Medication Sig Dispense  Refill  . acetaminophen (TYLENOL) 500 MG tablet Take 500 mg by mouth every 6 (six) hours as needed for mild pain or headache. Reported on 11/02/2015    . ALPRAZolam (XANAX) 0.25 MG tablet Take 1 tablet (0.25 mg total) by mouth 3 (three) times daily as needed. for anxiety 45 tablet 0  . Ascorbic Acid (VITAMIN C GUMMIE PO) Take 1 each by mouth every morning.    . benzonatate (TESSALON) 100 MG capsule Take 1 capsule (100 mg total) by mouth every 8 (eight) hours. 21 capsule 0  . dexamethasone (DECADRON) 4 MG tablet TAKE 1 TABLET BY MOUTH TWICE A DAY THE DAY BEFORE, DAY OF AND DAY AFTER THE CHEMOTHERAPY EVERY 3 WKS 40 tablet 1  . folic acid (FOLVITE) 1 MG tablet TAKE 1 TABLET BY MOUTH EVERY DAY 90 tablet 0  . Multiple Vitamin (MULTIVITAMIN WITH MINERALS) TABS tablet Take 1 tablet by mouth every morning.    Marland Kitchen omeprazole (PRILOSEC) 20 MG capsule Take 1 capsule (20 mg total) by mouth daily. As needed for reflux or indigestion. 42 capsule PRN  . ondansetron (ZOFRAN) 8 MG tablet Take 1 tablet (8 mg total) by mouth every 8 (eight) hours as needed for nausea or vomiting. 30 tablet 0  . OVER THE COUNTER MEDICATION Take 1 tablet by mouth every morning. (Vitamin A)    . pentoxifylline (TRENTAL) 400 MG CR tablet TAKE 1 TABLET BY MOUTH TWICE A DAY (TAKE WITH VITAMIN E 400MG TWICE A DAY)  3  . prochlorperazine (COMPAZINE) 10 MG tablet Take 1 tablet (  10 mg total) by mouth every 6 (six) hours as needed for nausea or vomiting. 60 tablet 0  . senna-docusate (SENOKOT-S) 8.6-50 MG tablet Take 1 tablet by mouth daily. 30 tablet prn   No current facility-administered medications for this visit.     SURGICAL HISTORY:  Past Surgical History:  Procedure Laterality Date  . ABDOMINAL HYSTERECTOMY    . COLOSTOMY TAKEDOWN N/A 07/10/2014   Procedure: LAPAROSCOPIC LYSIS OF ADHESIONS (90 MIN) LAPAROSCOPIC ASSISTED COLOSTOMY CLOSURE, RIGID PROCTOSIGMOIDOSCOPY;  Surgeon: Jackolyn Confer, MD;  Location: [facility];  Service: General;   Laterality: N/A;  . LAPAROTOMY N/A 11/03/2013   Procedure: EXPLORATORY LAPAROTOMY, DRAINAGE OF INTRA  ABDOMINAL ABSCESSES, MOBILIZATION OF SPLENIC FLEXURE, SIGMOID COLECTOMY WITH COLOSTOMY;  Surgeon: Odis Hollingshead, MD;  Location: [facility];  Service: General;  Laterality: N/A;  . VIDEO BRONCHOSCOPY Bilateral 08/30/2013   Procedure: VIDEO BRONCHOSCOPY WITH FLUORO;  Surgeon: Tanda Rockers, MD;  Location: [facility];  Service: Cardiopulmonary;  Laterality: Bilateral;    REVIEW OF SYSTEMS:  A comprehensive review of systems was negative.   PHYSICAL EXAMINATION: General appearance: alert, cooperative and no distress Head: Normocephalic, without obvious abnormality, atraumatic Neck: no adenopathy, no JVD, supple, symmetrical, trachea midline and thyroid not enlarged, symmetric, no tenderness/mass/nodules Lymph nodes: Cervical, supraclavicular, and axillary nodes normal. Resp: clear to auscultation bilaterally Back: symmetric, no curvature. ROM normal. No CVA tenderness. Cardio: regular rate and rhythm, S1, S2 normal, no murmur, click, rub or gallop GI: soft, non-tender; bowel sounds normal; no masses,  no organomegaly Extremities: extremities normal, atraumatic, no cyanosis or edema  ECOG PERFORMANCE STATUS: 0 - Asymptomatic  Blood pressure 128/76, pulse (!) 105, temperature 98 F (36.7 C), temperature source Oral, resp. rate 18, height 5' 4"  (1.626 m), weight 176 lb 14.4 oz (80.2 kg), SpO2 99 %.  LABORATORY DATA: Lab Results  Component Value Date   WBC 6.4 10/16/2017   HGB 12.1 10/16/2017   HCT 37.7 10/16/2017   MCV 101.3 (H) 10/16/2017   PLT 321 10/16/2017      Chemistry      Component Value Date/Time   NA 139 09/25/2017 0838   NA 139 08/14/2017 0837   K 4.1 09/25/2017 0838   K 4.4 08/14/2017 0837   CL 105 09/25/2017 0838   CO2 26 09/25/2017 0838   CO2 24 08/14/2017 0837   BUN 17 09/25/2017 0838   BUN 13.3 08/14/2017 0837   CREATININE 0.85 09/25/2017 0838   CREATININE 0.8  08/14/2017 0837      Component Value Date/Time   CALCIUM 9.8 09/25/2017 0838   CALCIUM 9.3 08/14/2017 0837   ALKPHOS 117 09/25/2017 0838   ALKPHOS 107 08/14/2017 0837   AST 12 09/25/2017 0838   AST 12 08/14/2017 0837   ALT 11 09/25/2017 0838   ALT 12 08/14/2017 0837   BILITOT 0.5 09/25/2017 0838   BILITOT 0.37 08/14/2017 0837       RADIOGRAPHIC STUDIES: No results found.  ASSESSMENT AND PLAN:  This is a very pleasant 67 years old white female with metastatic non-small cell lung cancer, adenocarcinoma status post induction systemic chemotherapy with carboplatin and Alimta with partial response. She is currently on maintenance treatment with single agent Alimta status post 58 cycles. The patient continues to tolerate this treatment fairly well with no concerning complaints.  I recommended for her to proceed with cycle #59 today as a scheduled. I will see her back for follow-up visit and 3 weeks for evaluation before starting cycle #60. For anxiety,  I gave her a refill of Xanax today. The patient was advised to call immediately if she has any concerning symptoms in the interval. The patient voices understanding of current disease status and treatment options and is in agreement with the current care plan. All questions were answered. The patient knows to call the clinic with any problems, questions or concerns. We can certainly see the patient much sooner if necessary.  Disclaimer: This note was dictated with voice recognition software. Similar sounding words can inadvertently be transcribed and may not be corrected upon review.

## 2017-11-06 ENCOUNTER — Inpatient Hospital Stay: Payer: Medicare Other

## 2017-11-06 ENCOUNTER — Encounter: Payer: Self-pay | Admitting: *Deleted

## 2017-11-06 ENCOUNTER — Encounter: Payer: Self-pay | Admitting: Internal Medicine

## 2017-11-06 ENCOUNTER — Inpatient Hospital Stay: Payer: Medicare Other | Attending: Internal Medicine

## 2017-11-06 ENCOUNTER — Inpatient Hospital Stay (HOSPITAL_BASED_OUTPATIENT_CLINIC_OR_DEPARTMENT_OTHER): Payer: Medicare Other | Admitting: Internal Medicine

## 2017-11-06 ENCOUNTER — Telehealth: Payer: Self-pay

## 2017-11-06 DIAGNOSIS — C7931 Secondary malignant neoplasm of brain: Secondary | ICD-10-CM | POA: Insufficient documentation

## 2017-11-06 DIAGNOSIS — C3411 Malignant neoplasm of upper lobe, right bronchus or lung: Secondary | ICD-10-CM

## 2017-11-06 DIAGNOSIS — Z5111 Encounter for antineoplastic chemotherapy: Secondary | ICD-10-CM | POA: Diagnosis not present

## 2017-11-06 DIAGNOSIS — Z9221 Personal history of antineoplastic chemotherapy: Secondary | ICD-10-CM | POA: Insufficient documentation

## 2017-11-06 DIAGNOSIS — C3490 Malignant neoplasm of unspecified part of unspecified bronchus or lung: Secondary | ICD-10-CM

## 2017-11-06 LAB — CBC WITH DIFFERENTIAL/PLATELET
Basophils Absolute: 0 10*3/uL (ref 0.0–0.1)
Basophils Relative: 0 %
Eosinophils Absolute: 0 10*3/uL (ref 0.0–0.5)
Eosinophils Relative: 0 %
HCT: 35.5 % (ref 34.8–46.6)
Hemoglobin: 11.8 g/dL (ref 11.6–15.9)
Lymphocytes Relative: 4 %
Lymphs Abs: 0.4 10*3/uL — ABNORMAL LOW (ref 0.9–3.3)
MCH: 32.3 pg (ref 25.1–34.0)
MCHC: 33.3 g/dL (ref 31.5–36.0)
MCV: 96.9 fL (ref 79.5–101.0)
Monocytes Absolute: 0.9 10*3/uL (ref 0.1–0.9)
Monocytes Relative: 9 %
Neutro Abs: 9.3 10*3/uL — ABNORMAL HIGH (ref 1.5–6.5)
Neutrophils Relative %: 87 %
Platelets: 315 10*3/uL (ref 145–400)
RBC: 3.66 MIL/uL — ABNORMAL LOW (ref 3.70–5.45)
RDW: 14.7 % — ABNORMAL HIGH (ref 11.2–14.5)
WBC: 10.6 10*3/uL — ABNORMAL HIGH (ref 3.9–10.3)

## 2017-11-06 LAB — COMPREHENSIVE METABOLIC PANEL
ALT: 13 U/L (ref 0–55)
AST: 11 U/L (ref 5–34)
Albumin: 3.2 g/dL — ABNORMAL LOW (ref 3.5–5.0)
Alkaline Phosphatase: 100 U/L (ref 40–150)
Anion gap: 9 (ref 3–11)
BUN: 14 mg/dL (ref 7–26)
CO2: 24 mmol/L (ref 22–29)
Calcium: 9.5 mg/dL (ref 8.4–10.4)
Chloride: 107 mmol/L (ref 98–109)
Creatinine, Ser: 0.72 mg/dL (ref 0.60–1.10)
GFR calc Af Amer: >60 mL/min (ref >60)
GFR calc non Af Amer: >60 mL/min (ref >60)
Glucose, Bld: 127 mg/dL (ref 70–140)
Potassium: 3.8 mmol/L (ref 3.5–5.1)
Sodium: 140 mmol/L (ref 136–145)
Total Bilirubin: 0.4 mg/dL (ref 0.2–1.2)
Total Protein: 6.8 g/dL (ref 6.4–8.3)

## 2017-11-06 MED ORDER — ONDANSETRON HCL 8 MG PO TABS
8.0000 mg | ORAL_TABLET | Freq: Once | ORAL | Status: AC
  Administered 2017-11-06: 8 mg via ORAL

## 2017-11-06 MED ORDER — ONDANSETRON HCL 8 MG PO TABS
ORAL_TABLET | ORAL | Status: AC
  Filled 2017-11-06: qty 1

## 2017-11-06 MED ORDER — DEXAMETHASONE SODIUM PHOSPHATE 10 MG/ML IJ SOLN
10.0000 mg | Freq: Once | INTRAMUSCULAR | Status: AC
  Administered 2017-11-06: 10 mg via INTRAVENOUS

## 2017-11-06 MED ORDER — DEXAMETHASONE SODIUM PHOSPHATE 10 MG/ML IJ SOLN
INTRAMUSCULAR | Status: AC
  Filled 2017-11-06: qty 1

## 2017-11-06 MED ORDER — SODIUM CHLORIDE 0.9 % IV SOLN
500.0000 mg/m2 | Freq: Once | INTRAVENOUS | Status: AC
  Administered 2017-11-06: 900 mg via INTRAVENOUS
  Filled 2017-11-06: qty 20

## 2017-11-06 MED ORDER — SODIUM CHLORIDE 0.9 % IV SOLN
Freq: Once | INTRAVENOUS | Status: AC
  Administered 2017-11-06: 11:00:00 via INTRAVENOUS

## 2017-11-06 MED ORDER — CYANOCOBALAMIN 1000 MCG/ML IJ SOLN
1000.0000 ug | Freq: Once | INTRAMUSCULAR | Status: AC
  Administered 2017-11-06: 1000 ug via INTRAMUSCULAR

## 2017-11-06 MED ORDER — CYANOCOBALAMIN 1000 MCG/ML IJ SOLN
INTRAMUSCULAR | Status: AC
  Filled 2017-11-06: qty 1

## 2017-11-06 NOTE — Telephone Encounter (Signed)
Printed avs and calender of upcoming appointment. Per 3/11 los

## 2017-11-06 NOTE — Patient Instructions (Signed)
[facility] Discharge Instructions for Patients Receiving Chemotherapy  Today you received the following chemotherapy agents Alimta  To help prevent nausea and vomiting after your treatment, we encourage you to take your nausea medication as directed   If you develop nausea and vomiting that is not controlled by your nausea medication, call the clinic.   BELOW ARE SYMPTOMS THAT SHOULD BE REPORTED IMMEDIATELY:  *FEVER GREATER THAN 100.5 F  *CHILLS WITH OR WITHOUT FEVER  NAUSEA AND VOMITING THAT IS NOT CONTROLLED WITH YOUR NAUSEA MEDICATION  *UNUSUAL SHORTNESS OF BREATH  *UNUSUAL BRUISING OR BLEEDING  TENDERNESS IN MOUTH AND THROAT WITH OR WITHOUT PRESENCE OF ULCERS  *URINARY PROBLEMS  *BOWEL PROBLEMS  UNUSUAL RASH Items with * indicate a potential emergency and should be followed up as soon as possible.  Feel free to call the clinic should you have any questions or concerns. The clinic phone number is [phone].  Please show the [facility] at check-in to the Emergency Department and triage nurse.

## 2017-11-06 NOTE — Progress Notes (Signed)
Oncology Nurse Navigator Documentation  Oncology Nurse Navigator Flowsheets 11/06/2017  Navigator Location [facility]  Navigator Encounter Type Clinic/MDC/spoke with patient and husband today.  They needs some paper work filled out for cancer policy.  I will updated HIM dept to complete. I explained this to patient and husband. They are thankful for the help.  Patient Visit Type MedOnc  Treatment Phase Treatment  Barriers/Navigation Needs Coordination of Care  Interventions Coordination of Care  Acuity Level 2  Time Spent with Patient 30

## 2017-11-06 NOTE — Progress Notes (Signed)
[place] [phone]) [phone]   [phone]) [phone]  OFFICE PROGRESS NOTE  [name], MD [address] [place] [address]  DIAGNOSIS: Stage IV (T2a, N0, M1b) non-small cell lung cancer, adenocarcinoma with negative EGFR and ALK mutations diagnosed in January 2015 and presented with right upper lobe lung mass in addition to a solitary brain metastasis.  PRIOR THERAPY: 1) Status post stereotactic radiotherapy to a solitary right parietal brain lesion under the care of Dr. [name] on 10/16/2013. 2) Status post palliative radiotherapy to the right lung tumor under the care of Dr. [name] completed on 12/05/2013. 3) Systemic chemotherapy with carboplatin for AUC of 5 and Alimta 500 mg/M2 every 3 weeks. First dose Jan 06 2014. Status post 6 cycles.  CURRENT THERAPY: Systemic chemotherapy with maintenance Alimta 500 MG/M2 every 3 weeks, status post 59 cycles.  INTERVAL HISTORY: Alexis [name] 67 y.o. female returns to the clinic today for follow-up visit accompanied by her husband.  The patient is feeling fine today with no specific complaint except for some aching pain and swelling of the lower extremities.  She denied having any chest pain, shortness of breath, cough or hemoptysis.  She denied having any fever or chills.  She has no nausea, vomiting, diarrhea or constipation.  She is here today for evaluation before starting cycle #60.  MEDICAL HISTORY: Past Medical History:  Diagnosis Date  . Anxiety   . Anxiety 06/20/2016  . Cervical cancer ([place])   . Encounter for antineoplastic chemotherapy 07/20/2015  . 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  MEDICATIONS:  Current Outpatient Medications  Medication Sig Dispense Refill  . acetaminophen (TYLENOL) 500 MG tablet Take 500 mg by mouth every 6 (six) hours as needed for mild pain or headache. Reported  on 11/02/2015    . ALPRAZolam (XANAX) 0.25 MG tablet Take 1 tablet (0.25 mg total) by mouth 3 (three) times daily as needed. for anxiety 45 tablet 0  . Ascorbic Acid (VITAMIN C GUMMIE PO) Take 1 each by mouth every morning.    . benzonatate (TESSALON) 100 MG capsule Take 1 capsule (100 mg total) by mouth every 8 (eight) hours. 21 capsule 0  . dexamethasone (DECADRON) 4 MG tablet TAKE 1 TABLET BY MOUTH TWICE A DAY THE DAY BEFORE, DAY OF AND DAY AFTER THE CHEMOTHERAPY EVERY 3 WKS 40 tablet 1  . folic acid (FOLVITE) 1 MG tablet TAKE 1 TABLET BY MOUTH EVERY DAY 90 tablet 0  . Multiple Vitamin (MULTIVITAMIN WITH MINERALS) TABS tablet Take 1 tablet by mouth every morning.    Marland Kitchen omeprazole (PRILOSEC) 20 MG capsule Take 1 capsule (20 mg total) by mouth daily. As needed for reflux or indigestion. 42 capsule PRN  . ondansetron (ZOFRAN) 8 MG tablet Take 1 tablet (8 mg total) by mouth every 8 (eight) hours as needed for nausea or vomiting. 30 tablet 0  . OVER THE COUNTER MEDICATION Take 1 tablet by mouth every morning. (Vitamin A)    . pentoxifylline (TRENTAL) 400 MG CR tablet TAKE 1 TABLET BY MOUTH TWICE A DAY (TAKE WITH VITAMIN E 400MG TWICE A DAY)  3  . prochlorperazine (COMPAZINE) 10 MG tablet Take 1 tablet (10 mg total) by mouth every 6 (six) hours as needed for nausea or vomiting. 60 tablet 0  . senna-docusate (SENOKOT-S) 8.6-50 MG  tablet Take 1 tablet by mouth daily. 30 tablet prn   No current facility-administered medications for this visit.     SURGICAL HISTORY:  Past Surgical History:  Procedure Laterality Date  . ABDOMINAL HYSTERECTOMY    . COLOSTOMY TAKEDOWN N/A 07/10/2014   Procedure: LAPAROSCOPIC LYSIS OF ADHESIONS (90 MIN) LAPAROSCOPIC ASSISTED COLOSTOMY CLOSURE, RIGID PROCTOSIGMOIDOSCOPY;  Surgeon: Jackolyn Confer, MD;  Location: [facility];  Service: General;  Laterality: N/A;  . LAPAROTOMY N/A 11/03/2013   Procedure: EXPLORATORY LAPAROTOMY, DRAINAGE OF INTRA  ABDOMINAL ABSCESSES, MOBILIZATION OF  SPLENIC FLEXURE, SIGMOID COLECTOMY WITH COLOSTOMY;  Surgeon: Odis Hollingshead, MD;  Location: [facility];  Service: General;  Laterality: N/A;  . VIDEO BRONCHOSCOPY Bilateral 08/30/2013   Procedure: VIDEO BRONCHOSCOPY WITH FLUORO;  Surgeon: Tanda Rockers, MD;  Location: [facility];  Service: Cardiopulmonary;  Laterality: Bilateral;    REVIEW OF SYSTEMS:  A comprehensive review of systems was negative.   PHYSICAL EXAMINATION: General appearance: alert, cooperative and no distress Head: Normocephalic, without obvious abnormality, atraumatic Neck: no adenopathy, no JVD, supple, symmetrical, trachea midline and thyroid not enlarged, symmetric, no tenderness/mass/nodules Lymph nodes: Cervical, supraclavicular, and axillary nodes normal. Resp: clear to auscultation bilaterally Back: symmetric, no curvature. ROM normal. No CVA tenderness. Cardio: regular rate and rhythm, S1, S2 normal, no murmur, click, rub or gallop GI: soft, non-tender; bowel sounds normal; no masses,  no organomegaly Extremities: extremities normal, atraumatic, no cyanosis or edema  ECOG PERFORMANCE STATUS: 0 - Asymptomatic  Blood pressure (!) 125/56, pulse 90, temperature 97.8 F (36.6 C), temperature source Oral, resp. rate 20, height '5\' 4"'$  (1.626 m), weight 175 lb 4.8 oz (79.5 kg), SpO2 100 %.  LABORATORY DATA: Lab Results  Component Value Date   WBC 10.6 (H) 11/06/2017   HGB 11.8 11/06/2017   HCT 35.5 11/06/2017   MCV 96.9 11/06/2017   PLT 315 11/06/2017      Chemistry      Component Value Date/Time   NA 140 10/16/2017 0751   NA 139 08/14/2017 0837   K 3.8 10/16/2017 0751   K 4.4 08/14/2017 0837   CL 108 10/16/2017 0751   CO2 22 10/16/2017 0751   CO2 24 08/14/2017 0837   BUN 9 10/16/2017 0751   BUN 13.3 08/14/2017 0837   CREATININE 0.74 10/16/2017 0751   CREATININE 0.8 08/14/2017 0837      Component Value Date/Time   CALCIUM 9.7 10/16/2017 0751   CALCIUM 9.3 08/14/2017 0837   ALKPHOS 93 10/16/2017 0751    ALKPHOS 107 08/14/2017 0837   AST 11 10/16/2017 0751   AST 12 08/14/2017 0837   ALT 10 10/16/2017 0751   ALT 12 08/14/2017 0837   BILITOT 0.5 10/16/2017 0751   BILITOT 0.37 08/14/2017 0837       RADIOGRAPHIC STUDIES: No results found.  ASSESSMENT AND PLAN:  This is a very pleasant 67 years old white female with metastatic non-small cell lung cancer, adenocarcinoma status post induction systemic chemotherapy with carboplatin and Alimta with partial response. She is currently on maintenance treatment with single agent Alimta status post 59 cycles. The patient continues to tolerate this treatment fairly well. I recommended for her to proceed with cycle #60 today as a scheduled. I will see her back for follow-up visit in 3 weeks for evaluation after repeating CT scan of the chest, abdomen and pelvis for restaging of her disease. The patient was advised to call immediately if she has any concerning symptoms in the interval. The patient voices understanding  of current disease status and treatment options and is in agreement with the current care plan. All questions were answered. The patient knows to call the clinic with any problems, questions or concerns. We can certainly see the patient much sooner if necessary.  Disclaimer: This note was dictated with voice recognition software. Similar sounding words can inadvertently be transcribed and may not be corrected upon review.

## 2017-11-06 NOTE — Progress Notes (Signed)
Oncology Nurse Navigator Documentation  Oncology Nurse Navigator Flowsheets 11/06/2017  Navigator Location [facility]-  Navigator Encounter Type Patient gave me a cancer policy that needs to be completed. I took to HIM dept to complete.   Treatment Phase Treatment  Interventions Other  Acuity Level 1  Time Spent with Patient 30

## 2017-11-08 ENCOUNTER — Other Ambulatory Visit: Payer: Self-pay | Admitting: Internal Medicine

## 2017-11-08 DIAGNOSIS — C3490 Malignant neoplasm of unspecified part of unspecified bronchus or lung: Secondary | ICD-10-CM

## 2017-11-13 ENCOUNTER — Telehealth: Payer: Self-pay | Admitting: Internal Medicine

## 2017-11-13 NOTE — Telephone Encounter (Signed)
1:38 pm spoke with patient to ask for a fax number to fax Disability Insurance paperwork.  Patient stated she would get back with me when she gets the number.

## 2017-11-13 NOTE — Telephone Encounter (Signed)
Faxed Diability paperwork to Health Claims ([place]) to [phone].  Mailed a copy to patient for personal records.

## 2017-11-24 ENCOUNTER — Ambulatory Visit (HOSPITAL_COMMUNITY)
Admission: RE | Admit: 2017-11-24 | Discharge: 2017-11-24 | Disposition: A | Discharge status: Home / Self Care | Payer: Medicare Other | Source: Ambulatory Visit | Attending: Internal Medicine | Admitting: Internal Medicine

## 2017-11-24 ENCOUNTER — Encounter (HOSPITAL_COMMUNITY): Payer: Self-pay

## 2017-11-24 DIAGNOSIS — C3490 Malignant neoplasm of unspecified part of unspecified bronchus or lung: Secondary | ICD-10-CM | POA: Insufficient documentation

## 2017-11-24 DIAGNOSIS — J439 Emphysema, unspecified: Secondary | ICD-10-CM | POA: Insufficient documentation

## 2017-11-24 DIAGNOSIS — C3411 Malignant neoplasm of upper lobe, right bronchus or lung: Secondary | ICD-10-CM | POA: Diagnosis not present

## 2017-11-24 DIAGNOSIS — I700 Atherosclerosis of aorta: Secondary | ICD-10-CM | POA: Insufficient documentation

## 2017-11-24 MED ORDER — IOPAMIDOL (ISOVUE-300) INJECTION 61%
INTRAVENOUS | Status: AC
  Administered 2017-11-24: 100 mL via INTRAVENOUS
  Filled 2017-11-24: qty 100

## 2017-11-24 MED ORDER — IOPAMIDOL (ISOVUE-300) INJECTION 61%
100.0000 mL | Freq: Once | INTRAVENOUS | Status: AC | PRN
  Administered 2017-11-24: 100 mL via INTRAVENOUS

## 2017-11-27 ENCOUNTER — Telehealth: Payer: Self-pay | Admitting: Internal Medicine

## 2017-11-27 ENCOUNTER — Inpatient Hospital Stay: Payer: Medicare Other

## 2017-11-27 ENCOUNTER — Inpatient Hospital Stay: Payer: Medicare Other | Attending: Internal Medicine

## 2017-11-27 ENCOUNTER — Encounter: Payer: Self-pay | Admitting: Internal Medicine

## 2017-11-27 ENCOUNTER — Inpatient Hospital Stay (HOSPITAL_BASED_OUTPATIENT_CLINIC_OR_DEPARTMENT_OTHER): Payer: Medicare Other | Admitting: Internal Medicine

## 2017-11-27 DIAGNOSIS — Z9221 Personal history of antineoplastic chemotherapy: Secondary | ICD-10-CM

## 2017-11-27 DIAGNOSIS — Z79899 Other long term (current) drug therapy: Secondary | ICD-10-CM | POA: Insufficient documentation

## 2017-11-27 DIAGNOSIS — C7931 Secondary malignant neoplasm of brain: Secondary | ICD-10-CM | POA: Diagnosis not present

## 2017-11-27 DIAGNOSIS — Z5111 Encounter for antineoplastic chemotherapy: Secondary | ICD-10-CM | POA: Diagnosis not present

## 2017-11-27 DIAGNOSIS — F419 Anxiety disorder, unspecified: Secondary | ICD-10-CM | POA: Diagnosis not present

## 2017-11-27 DIAGNOSIS — C3411 Malignant neoplasm of upper lobe, right bronchus or lung: Secondary | ICD-10-CM

## 2017-11-27 DIAGNOSIS — C7949 Secondary malignant neoplasm of other parts of nervous system: Secondary | ICD-10-CM

## 2017-11-27 DIAGNOSIS — F411 Generalized anxiety disorder: Secondary | ICD-10-CM

## 2017-11-27 LAB — CBC WITH DIFFERENTIAL/PLATELET
Basophils Absolute: 0 K/uL (ref 0.0–0.1)
Basophils Relative: 0 %
Eosinophils Absolute: 0 K/uL (ref 0.0–0.5)
Eosinophils Relative: 0 %
HCT: 36.2 % (ref 34.8–46.6)
Hemoglobin: 12.1 g/dL (ref 11.6–15.9)
Lymphocytes Relative: 4 %
Lymphs Abs: 0.3 K/uL — ABNORMAL LOW (ref 0.9–3.3)
MCH: 32.3 pg (ref 25.1–34.0)
MCHC: 33.3 g/dL (ref 31.5–36.0)
MCV: 96.8 fL (ref 79.5–101.0)
Monocytes Absolute: 0.5 K/uL (ref 0.1–0.9)
Monocytes Relative: 5 %
Neutro Abs: 8.3 K/uL — ABNORMAL HIGH (ref 1.5–6.5)
Neutrophils Relative %: 91 %
Platelets: 311 K/uL (ref 145–400)
RBC: 3.73 MIL/uL (ref 3.70–5.45)
RDW: 14.8 % — ABNORMAL HIGH (ref 11.2–14.5)
WBC: 9.1 K/uL (ref 3.9–10.3)

## 2017-11-27 LAB — COMPREHENSIVE METABOLIC PANEL WITH GFR
ALT: 10 U/L (ref 0–55)
AST: 12 U/L (ref 5–34)
Albumin: 3.4 g/dL — ABNORMAL LOW (ref 3.5–5.0)
Alkaline Phosphatase: 112 U/L (ref 40–150)
Anion gap: 10 (ref 3–11)
BUN: 12 mg/dL (ref 7–26)
CO2: 24 mmol/L (ref 22–29)
Calcium: 9.7 mg/dL (ref 8.4–10.4)
Chloride: 105 mmol/L (ref 98–109)
Creatinine, Ser: 0.79 mg/dL (ref 0.60–1.10)
GFR calc Af Amer: >60 mL/min (ref >60)
GFR calc non Af Amer: >60 mL/min (ref >60)
Glucose, Bld: 131 mg/dL (ref 70–140)
Potassium: 4.5 mmol/L (ref 3.5–5.1)
Sodium: 139 mmol/L (ref 136–145)
Total Bilirubin: 0.4 mg/dL (ref 0.2–1.2)
Total Protein: 6.9 g/dL (ref 6.4–8.3)

## 2017-11-27 MED ORDER — SODIUM CHLORIDE 0.9 % IV SOLN
Freq: Once | INTRAVENOUS | Status: AC
  Administered 2017-11-27: 10:00:00 via INTRAVENOUS

## 2017-11-27 MED ORDER — DEXAMETHASONE SODIUM PHOSPHATE 10 MG/ML IJ SOLN
10.0000 mg | Freq: Once | INTRAMUSCULAR | Status: AC
  Administered 2017-11-27: 10 mg via INTRAVENOUS

## 2017-11-27 MED ORDER — DEXAMETHASONE SODIUM PHOSPHATE 10 MG/ML IJ SOLN
INTRAMUSCULAR | Status: AC
  Filled 2017-11-27: qty 1

## 2017-11-27 MED ORDER — ONDANSETRON HCL 8 MG PO TABS
ORAL_TABLET | ORAL | Status: AC
  Filled 2017-11-27: qty 1

## 2017-11-27 MED ORDER — SODIUM CHLORIDE 0.9 % IV SOLN
500.0000 mg/m2 | Freq: Once | INTRAVENOUS | Status: AC
  Administered 2017-11-27: 900 mg via INTRAVENOUS
  Filled 2017-11-27: qty 16

## 2017-11-27 MED ORDER — ONDANSETRON HCL 8 MG PO TABS
8.0000 mg | ORAL_TABLET | Freq: Once | ORAL | Status: AC
  Administered 2017-11-27: 8 mg via ORAL

## 2017-11-27 MED ORDER — ALPRAZOLAM 0.25 MG PO TABS: 0.2500 mg | ORAL_TABLET | Freq: Three times a day (TID) | ORAL | 0 refills | Status: DC | PRN

## 2017-11-27 NOTE — Progress Notes (Signed)
[place] Telephone:(336) [phone]   Fax:(336) [phone]  OFFICE PROGRESS NOTE  [name], MD [address] [place] [address]  DIAGNOSIS: Stage IV (T2a, N0, M1b) non-small cell lung cancer, adenocarcinoma with negative EGFR and ALK mutations diagnosed in January 2015 and presented with right upper lobe lung mass in addition to a solitary brain metastasis.  PRIOR THERAPY: 1) Status post stereotactic radiotherapy to a solitary right parietal brain lesion under the care of Dr. [name] on 10/16/2013. 2) Status post palliative radiotherapy to the right lung tumor under the care of Dr. [name] completed on 12/05/2013. 3) Systemic chemotherapy with carboplatin for AUC of 5 and Alimta 500 mg/M2 every 3 weeks. First dose Jan 06 2014. Status post 6 cycles.  CURRENT THERAPY: Systemic chemotherapy with maintenance Alimta 500 MG/M2 every 3 weeks, status post 60 cycles.  INTERVAL HISTORY: Alexis Alexis [name] 67 y.o. Alexis [name] returns to the clinic today for follow-up visit accompanied by her husband.  The patient is feeling fine today with no specific complaints.  She denied having any chest pain, shortness of breath, cough or hemoptysis.  She denied having any fever or chills.  She has no nausea, vomiting, diarrhea or constipation.  She denied having any weight loss or night sweats.  She continues to tolerate her treatment with maintenance Alimta fairly well.  She had repeat CT scan of the chest, abdomen and pelvis performed recently and she is here for evaluation and discussion of her scan results.   MEDICAL HISTORY: Past Medical History:  Diagnosis Date  . Anxiety   . Anxiety 06/20/2016  . Cervical cancer ([place])   . Encounter for antineoplastic chemotherapy 07/20/2015  . 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  MEDICATIONS:  Current Outpatient Medications  Medication  Sig Dispense Refill  . acetaminophen (TYLENOL) 500 MG tablet Take 500 mg by mouth every 6 (six) hours as needed for mild pain or headache. Reported on 11/02/2015    . ALPRAZolam (XANAX) 0.25 MG tablet Take 1 tablet (0.25 mg total) by mouth 3 (three) times daily as needed. for anxiety 45 tablet 0  . Ascorbic Acid (VITAMIN C GUMMIE PO) Take 1 each by mouth every morning.    . benzonatate (TESSALON) 100 MG capsule Take 1 capsule (100 mg total) by mouth every 8 (eight) hours. 21 capsule 0  . dexamethasone (DECADRON) 4 MG tablet TAKE 1 TABLET BY MOUTH TWICE A DAY THE DAY BEFORE, DAY OF AND DAY AFTER THE CHEMOTHERAPY EVERY 3 WKS 40 tablet 1  . folic acid (FOLVITE) 1 MG tablet TAKE 1 TABLET BY MOUTH EVERY DAY 90 tablet 0  . Multiple Vitamin (MULTIVITAMIN WITH MINERALS) TABS tablet Take 1 tablet by mouth every morning.    Marland Kitchen omeprazole (PRILOSEC) 20 MG capsule Take 1 capsule (20 mg total) by mouth daily. As needed for reflux or indigestion. 42 capsule PRN  . ondansetron (ZOFRAN) 8 MG tablet Take 1 tablet (8 mg total) by mouth every 8 (eight) hours as needed for nausea or vomiting. 30 tablet 0  . OVER THE COUNTER MEDICATION Take 1 tablet by mouth every morning. (Vitamin A)    . pentoxifylline (TRENTAL) 400 MG CR tablet TAKE 1 TABLET BY MOUTH TWICE A DAY (TAKE WITH VITAMIN E 400MG TWICE A DAY)  3  . prochlorperazine (COMPAZINE) 10 MG tablet  Take 1 tablet (10 mg total) by mouth every 6 (six) hours as needed for nausea or vomiting. 60 tablet 0  . senna-docusate (SENOKOT-S) 8.6-50 MG tablet Take 1 tablet by mouth daily. 30 tablet prn   No current facility-administered medications for this visit.     SURGICAL HISTORY:  Past Surgical History:  Procedure Laterality Date  . ABDOMINAL HYSTERECTOMY    . COLOSTOMY TAKEDOWN N/A 07/10/2014   Procedure: LAPAROSCOPIC LYSIS OF ADHESIONS (90 MIN) LAPAROSCOPIC ASSISTED COLOSTOMY CLOSURE, RIGID PROCTOSIGMOIDOSCOPY;  Surgeon: Jackolyn Confer, MD;  Location: [facility];  Service:  General;  Laterality: N/A;  . LAPAROTOMY N/A 11/03/2013   Procedure: EXPLORATORY LAPAROTOMY, DRAINAGE OF INTRA  ABDOMINAL ABSCESSES, MOBILIZATION OF SPLENIC FLEXURE, SIGMOID COLECTOMY WITH COLOSTOMY;  Surgeon: Odis Hollingshead, MD;  Location: [facility];  Service: General;  Laterality: N/A;  . VIDEO BRONCHOSCOPY Bilateral 08/30/2013   Procedure: VIDEO BRONCHOSCOPY WITH FLUORO;  Surgeon: Tanda Rockers, MD;  Location: [facility];  Service: Cardiopulmonary;  Laterality: Bilateral;    REVIEW OF SYSTEMS:  Constitutional: negative Eyes: negative Ears, nose, mouth, throat, and face: negative Respiratory: negative Cardiovascular: negative Gastrointestinal: negative Genitourinary:negative Integument/breast: negative Hematologic/lymphatic: negative Musculoskeletal:negative Neurological: negative Behavioral/Psych: negative Endocrine: negative Allergic/Immunologic: negative   PHYSICAL EXAMINATION: General appearance: alert, cooperative and no distress Head: Normocephalic, without obvious abnormality, atraumatic Neck: no adenopathy, no JVD, supple, symmetrical, trachea midline and thyroid not enlarged, symmetric, no tenderness/mass/nodules Lymph nodes: Cervical, supraclavicular, and axillary nodes normal. Resp: clear to auscultation bilaterally Back: symmetric, no curvature. ROM normal. No CVA tenderness. Cardio: regular rate and rhythm, S1, S2 normal, no murmur, click, rub or gallop GI: soft, non-tender; bowel sounds normal; no masses,  no organomegaly Extremities: extremities normal, atraumatic, no cyanosis or edema Neurologic: Alert and oriented X 3, normal strength and tone. Normal symmetric reflexes. Normal coordination and gait  ECOG PERFORMANCE STATUS: 0 - Asymptomatic  Blood pressure 118/64, pulse 91, temperature 98.2 F (36.8 C), temperature source Oral, resp. rate 18, height '5\' 4"'$  (1.626 m), weight 175 lb (79.4 kg), SpO2 100 %.  LABORATORY DATA: Lab Results  Component Value Date    WBC 9.1 11/27/2017   HGB 12.1 11/27/2017   HCT 36.2 11/27/2017   MCV 96.8 11/27/2017   PLT 311 11/27/2017      Chemistry      Component Value Date/Time   NA 140 11/06/2017 0834   NA 139 08/14/2017 0837   K 3.8 11/06/2017 0834   K 4.4 08/14/2017 0837   CL 107 11/06/2017 0834   CO2 24 11/06/2017 0834   CO2 24 08/14/2017 0837   BUN 14 11/06/2017 0834   BUN 13.3 08/14/2017 0837   CREATININE 0.72 11/06/2017 0834   CREATININE 0.8 08/14/2017 0837      Component Value Date/Time   CALCIUM 9.5 11/06/2017 0834   CALCIUM 9.3 08/14/2017 0837   ALKPHOS 100 11/06/2017 0834   ALKPHOS 107 08/14/2017 0837   AST 11 11/06/2017 0834   AST 12 08/14/2017 0837   ALT 13 11/06/2017 0834   ALT 12 08/14/2017 0837   BILITOT 0.4 11/06/2017 0834   BILITOT 0.37 08/14/2017 0837       RADIOGRAPHIC STUDIES: Ct Chest W Contrast  Result Date: 11/24/2017 CLINICAL DATA:  Lung cancer. Right upper lobe lung mass with solitary brain metastasis. EXAM: CT CHEST, ABDOMEN, AND PELVIS WITH CONTRAST TECHNIQUE: Multidetector CT imaging of the chest, abdomen and pelvis was performed following the standard protocol during bolus administration of intravenous contrast. CONTRAST:  115m ISOVUE-300 IOPAMIDOL (  ISOVUE-300) INJECTION 61% COMPARISON:  09/01/2017 FINDINGS: CT CHEST FINDINGS Cardiovascular: The heart size is normal. No pericardial effusion. Atherosclerotic calcification is noted in the wall of the thoracic aorta. Mediastinum/Nodes: No mediastinal lymphadenopathy. 7 mm short axis right hilar lymph node measured previously is similar today. No left hilar lymphadenopathy. The esophagus has normal imaging features. There is no axillary lymphadenopathy. Lungs/Pleura: Right apical lesion is similar measuring 3.7 x 3.3 cm today compared to 3.6 x 3.4 cm previously. Left apical pleuroparenchymal scarring is stable. Centrilobular emphysema again noted. 3 mm ground-glass nodule in the left upper lobe is new in the interval and  likely related to an infectious or inflammatory focus of alveolitis. No pleural effusion. Musculoskeletal: Bone windows reveal no worrisome lytic or sclerotic osseous lesions. CT ABDOMEN PELVIS FINDINGS Hepatobiliary: No focal abnormality within the liver parenchyma. There is no evidence for gallstones, gallbladder wall thickening, or pericholecystic fluid. No intrahepatic or extrahepatic biliary dilation. Pancreas: No focal mass lesion. No dilatation of the main duct. No intraparenchymal cyst. No peripancreatic edema. Spleen: No splenomegaly. No focal mass lesion. Adrenals/Urinary Tract: No adrenal nodule or mass. Kidneys are unremarkable. No evidence for hydroureter. The urinary bladder appears normal for the degree of distention. Stomach/Bowel: Stomach is nondistended. No gastric wall thickening. No evidence of outlet obstruction. Duodenum is normally positioned as is the ligament of Treitz. No small bowel wall thickening. No small bowel dilatation. The terminal ileum is normal. The appendix is not visualized, but there is no edema or inflammation in the region of the cecum. Anastomotic suture line identified distal sigmoid colon. No gross colonic mass. No colonic wall thickening. No substantial diverticular change. Vascular/Lymphatic: There is abdominal aortic atherosclerosis without aneurysm. There is no gastrohepatic or hepatoduodenal ligament lymphadenopathy. No intraperitoneal or retroperitoneal lymphadenopathy. No pelvic sidewall lymphadenopathy. Reproductive: Uterus surgically absent.  There is no adnexal mass. Other: No intraperitoneal free fluid. Musculoskeletal: Bone windows reveal no worrisome lytic or sclerotic osseous lesions. Similar appearance of small paraumbilical hernias containing only fat. IMPRESSION: 1. No change in the right apical nodule in the interval since the prior study compatible with treated disease. 2. Tiny ground-glass nodule left upper lobe likely post infectious/inflammatory  alveolitis. Attention on follow-up recommended. 3. No evidence for new metastatic disease in the chest, abdomen, or pelvis. 4.  Aortic Atherosclerois (ICD10-170.0) 5.  Emphysema. ([id number]) Electronically Signed   By: Misty Stanley M.D.   On: 11/24/2017 13:51   Ct Abdomen Pelvis W Contrast  Result Date: 11/24/2017 CLINICAL DATA:  Lung cancer. Right upper lobe lung mass with solitary brain metastasis. EXAM: CT CHEST, ABDOMEN, AND PELVIS WITH CONTRAST TECHNIQUE: Multidetector CT imaging of the chest, abdomen and pelvis was performed following the standard protocol during bolus administration of intravenous contrast. CONTRAST:  123m ISOVUE-300 IOPAMIDOL (ISOVUE-300) INJECTION 61% COMPARISON:  09/01/2017 FINDINGS: CT CHEST FINDINGS Cardiovascular: The heart size is normal. No pericardial effusion. Atherosclerotic calcification is noted in the wall of the thoracic aorta. Mediastinum/Nodes: No mediastinal lymphadenopathy. 7 mm short axis right hilar lymph node measured previously is similar today. No left hilar lymphadenopathy. The esophagus has normal imaging features. There is no axillary lymphadenopathy. Lungs/Pleura: Right apical lesion is similar measuring 3.7 x 3.3 cm today compared to 3.6 x 3.4 cm previously. Left apical pleuroparenchymal scarring is stable. Centrilobular emphysema again noted. 3 mm ground-glass nodule in the left upper lobe is new in the interval and likely related to an infectious or inflammatory focus of alveolitis. No pleural effusion. Musculoskeletal: Bone windows reveal no worrisome  lytic or sclerotic osseous lesions. CT ABDOMEN PELVIS FINDINGS Hepatobiliary: No focal abnormality within the liver parenchyma. There is no evidence for gallstones, gallbladder wall thickening, or pericholecystic fluid. No intrahepatic or extrahepatic biliary dilation. Pancreas: No focal mass lesion. No dilatation of the main duct. No intraparenchymal cyst. No peripancreatic edema. Spleen: No splenomegaly.  No focal mass lesion. Adrenals/Urinary Tract: No adrenal nodule or mass. Kidneys are unremarkable. No evidence for hydroureter. The urinary bladder appears normal for the degree of distention. Stomach/Bowel: Stomach is nondistended. No gastric wall thickening. No evidence of outlet obstruction. Duodenum is normally positioned as is the ligament of Treitz. No small bowel wall thickening. No small bowel dilatation. The terminal ileum is normal. The appendix is not visualized, but there is no edema or inflammation in the region of the cecum. Anastomotic suture line identified distal sigmoid colon. No gross colonic mass. No colonic wall thickening. No substantial diverticular change. Vascular/Lymphatic: There is abdominal aortic atherosclerosis without aneurysm. There is no gastrohepatic or hepatoduodenal ligament lymphadenopathy. No intraperitoneal or retroperitoneal lymphadenopathy. No pelvic sidewall lymphadenopathy. Reproductive: Uterus surgically absent.  There is no adnexal mass. Other: No intraperitoneal free fluid. Musculoskeletal: Bone windows reveal no worrisome lytic or sclerotic osseous lesions. Similar appearance of small paraumbilical hernias containing only fat. IMPRESSION: 1. No change in the right apical nodule in the interval since the prior study compatible with treated disease. 2. Tiny ground-glass nodule left upper lobe likely post infectious/inflammatory alveolitis. Attention on follow-up recommended. 3. No evidence for new metastatic disease in the chest, abdomen, or pelvis. 4.  Aortic Atherosclerois (ICD10-170.0) 5.  Emphysema. ([id number]) Electronically Signed   By: Misty Stanley M.D.   On: 11/24/2017 13:51    ASSESSMENT AND PLAN:  This is a very pleasant 67 years old white Alexis [name] with metastatic non-small cell lung cancer, adenocarcinoma status post induction systemic chemotherapy with carboplatin and Alimta with partial response. She is currently on maintenance treatment with single  agent Alimta status post 60 cycles. She tolerated the last cycle of her treatment fairly well. She had repeat CT scan of the chest, abdomen and pelvis performed recently.  I personally and independently reviewed the scans and discussed the results with the patient and her husband.  Her scan showed no concerning findings for disease progression. I recommended for the patient to continue her current treatment with maintenance Alimta and she will proceed with cycle #61 today. For the anxiety, I gave the patient refill of Xanax today. She will come back for follow-up visit in 3 weeks for evaluation before the next cycle of her treatment. She was advised to call immediately if she has any concerning symptoms in the interval. The patient voices understanding of current disease status and treatment options and is in agreement with the current care plan. All questions were answered. The patient knows to call the clinic with any problems, questions or concerns. We can certainly see the patient much sooner if necessary.  Disclaimer: This note was dictated with voice recognition software. Similar sounding words can inadvertently be transcribed and may not be corrected upon review.

## 2017-11-27 NOTE — Telephone Encounter (Signed)
Scheduled appt per 4/1 los - patient to get an updated schedule in the treatment area.

## 2017-11-27 NOTE — Patient Instructions (Signed)
[facility] Discharge Instructions for Patients Receiving Chemotherapy  Today you received the following chemotherapy agents:  Alimta  To help prevent nausea and vomiting after your treatment, we encourage you to take your nausea medication as prescribed.   If you develop nausea and vomiting that is not controlled by your nausea medication, call the clinic.   BELOW ARE SYMPTOMS THAT SHOULD BE REPORTED IMMEDIATELY:  *FEVER GREATER THAN 100.5 F  *CHILLS WITH OR WITHOUT FEVER  NAUSEA AND VOMITING THAT IS NOT CONTROLLED WITH YOUR NAUSEA MEDICATION  *UNUSUAL SHORTNESS OF BREATH  *UNUSUAL BRUISING OR BLEEDING  TENDERNESS IN MOUTH AND THROAT WITH OR WITHOUT PRESENCE OF ULCERS  *URINARY PROBLEMS  *BOWEL PROBLEMS  UNUSUAL RASH Items with * indicate a potential emergency and should be followed up as soon as possible.  Feel free to call the clinic should you have any questions or concerns. The clinic phone number is [phone].  Please show the [facility] at check-in to the Emergency Department and triage nurse.

## 2017-12-18 ENCOUNTER — Inpatient Hospital Stay: Payer: Medicare Other

## 2017-12-18 ENCOUNTER — Telehealth: Payer: Self-pay | Admitting: Internal Medicine

## 2017-12-18 ENCOUNTER — Inpatient Hospital Stay (HOSPITAL_BASED_OUTPATIENT_CLINIC_OR_DEPARTMENT_OTHER): Payer: Medicare Other | Admitting: Internal Medicine

## 2017-12-18 ENCOUNTER — Inpatient Hospital Stay: Payer: Medicare Other | Attending: Internal Medicine

## 2017-12-18 ENCOUNTER — Encounter: Payer: Self-pay | Admitting: Internal Medicine

## 2017-12-18 VITALS — BP 129/60 | HR 98 | Temp 97.5°F | Resp 21 | Ht 64.0 in | Wt 176.5 lb

## 2017-12-18 DIAGNOSIS — F419 Anxiety disorder, unspecified: Secondary | ICD-10-CM | POA: Insufficient documentation

## 2017-12-18 DIAGNOSIS — C3411 Malignant neoplasm of upper lobe, right bronchus or lung: Secondary | ICD-10-CM

## 2017-12-18 DIAGNOSIS — T451X5A Adverse effect of antineoplastic and immunosuppressive drugs, initial encounter: Secondary | ICD-10-CM

## 2017-12-18 DIAGNOSIS — Z5111 Encounter for antineoplastic chemotherapy: Secondary | ICD-10-CM | POA: Diagnosis not present

## 2017-12-18 DIAGNOSIS — C7931 Secondary malignant neoplasm of brain: Secondary | ICD-10-CM

## 2017-12-18 DIAGNOSIS — Z79899 Other long term (current) drug therapy: Secondary | ICD-10-CM | POA: Diagnosis not present

## 2017-12-18 DIAGNOSIS — Z9221 Personal history of antineoplastic chemotherapy: Secondary | ICD-10-CM | POA: Insufficient documentation

## 2017-12-18 DIAGNOSIS — D6481 Anemia due to antineoplastic chemotherapy: Secondary | ICD-10-CM

## 2017-12-18 LAB — COMPREHENSIVE METABOLIC PANEL
ALT: 12 U/L (ref 0–55)
AST: 12 U/L (ref 5–34)
Albumin: 3.5 g/dL (ref 3.5–5.0)
Alkaline Phosphatase: 110 U/L (ref 40–150)
Anion gap: 9 (ref 3–11)
BUN: 11 mg/dL (ref 7–26)
CO2: 23 mmol/L (ref 22–29)
Calcium: 9.7 mg/dL (ref 8.4–10.4)
Chloride: 107 mmol/L (ref 98–109)
Creatinine, Ser: 0.78 mg/dL (ref 0.60–1.10)
GFR calc Af Amer: >60 mL/min (ref >60)
GFR calc non Af Amer: >60 mL/min (ref >60)
Glucose, Bld: 129 mg/dL (ref 70–140)
Potassium: 4.1 mmol/L (ref 3.5–5.1)
Sodium: 139 mmol/L (ref 136–145)
Total Bilirubin: 0.3 mg/dL (ref 0.2–1.2)
Total Protein: 7 g/dL (ref 6.4–8.3)

## 2017-12-18 LAB — CBC WITH DIFFERENTIAL/PLATELET
Basophils Absolute: 0 10*3/uL (ref 0.0–0.1)
Basophils Relative: 0 %
Eosinophils Absolute: 0 10*3/uL (ref 0.0–0.5)
Eosinophils Relative: 0 %
HCT: 36.6 % (ref 34.8–46.6)
Hemoglobin: 12.2 g/dL (ref 11.6–15.9)
Lymphocytes Relative: 5 %
Lymphs Abs: 0.4 10*3/uL — ABNORMAL LOW (ref 0.9–3.3)
MCH: 31.9 pg (ref 25.1–34.0)
MCHC: 33.2 g/dL (ref 31.5–36.0)
MCV: 95.9 fL (ref 79.5–101.0)
Monocytes Absolute: 0.8 10*3/uL (ref 0.1–0.9)
Monocytes Relative: 9 %
Neutro Abs: 7.5 10*3/uL — ABNORMAL HIGH (ref 1.5–6.5)
Neutrophils Relative %: 86 %
Platelets: 316 10*3/uL (ref 145–400)
RBC: 3.81 MIL/uL (ref 3.70–5.45)
RDW: 15 % — ABNORMAL HIGH (ref 11.2–14.5)
WBC: 8.7 10*3/uL (ref 3.9–10.3)

## 2017-12-18 MED ORDER — CYANOCOBALAMIN 1000 MCG/ML IJ SOLN
1000.0000 ug | Freq: Once | INTRAMUSCULAR | Status: AC
  Administered 2017-12-18: 1000 ug via INTRAMUSCULAR

## 2017-12-18 MED ORDER — SODIUM CHLORIDE 0.9 % IV SOLN
Freq: Once | INTRAVENOUS | Status: AC
  Administered 2017-12-18: 10:00:00 via INTRAVENOUS

## 2017-12-18 MED ORDER — SODIUM CHLORIDE 0.9 % IV SOLN
500.0000 mg/m2 | Freq: Once | INTRAVENOUS | Status: AC
  Administered 2017-12-18: 900 mg via INTRAVENOUS
  Filled 2017-12-18: qty 20

## 2017-12-18 MED ORDER — ONDANSETRON HCL 8 MG PO TABS
ORAL_TABLET | ORAL | Status: AC
  Filled 2017-12-18: qty 1

## 2017-12-18 MED ORDER — DEXAMETHASONE SODIUM PHOSPHATE 10 MG/ML IJ SOLN
10.0000 mg | Freq: Once | INTRAMUSCULAR | Status: AC
  Administered 2017-12-18: 10 mg via INTRAVENOUS

## 2017-12-18 MED ORDER — CYANOCOBALAMIN 1000 MCG/ML IJ SOLN
INTRAMUSCULAR | Status: AC
  Filled 2017-12-18: qty 1

## 2017-12-18 MED ORDER — DEXAMETHASONE SODIUM PHOSPHATE 10 MG/ML IJ SOLN
INTRAMUSCULAR | Status: AC
Start: 2017-12-18 — End: 2017-12-18
  Filled 2017-12-18: qty 1

## 2017-12-18 MED ORDER — ONDANSETRON HCL 8 MG PO TABS
8.0000 mg | ORAL_TABLET | Freq: Once | ORAL | Status: AC
Start: 2017-12-18 — End: 2017-12-18
  Administered 2017-12-18: 8 mg via ORAL

## 2017-12-18 NOTE — Telephone Encounter (Signed)
3 cycles already scheduled per 4/22 los - no additional appts added

## 2017-12-18 NOTE — Patient Instructions (Signed)
[name] Discharge Instructions for Patients Receiving Chemotherapy  Today you received the following chemotherapy agents alimta  To help prevent nausea and vomiting after your treatment, we encourage you to take your nausea medication as directed   If you develop nausea and vomiting that is not controlled by your nausea medication, call the clinic.   BELOW ARE SYMPTOMS THAT SHOULD BE REPORTED IMMEDIATELY:  *FEVER GREATER THAN 100.5 F  *CHILLS WITH OR WITHOUT FEVER  NAUSEA AND VOMITING THAT IS NOT CONTROLLED WITH YOUR NAUSEA MEDICATION  *UNUSUAL SHORTNESS OF BREATH  *UNUSUAL BRUISING OR BLEEDING  TENDERNESS IN MOUTH AND THROAT WITH OR WITHOUT PRESENCE OF ULCERS  *URINARY PROBLEMS  *BOWEL PROBLEMS  UNUSUAL RASH Items with * indicate a potential emergency and should be followed up as soon as possible.  Feel free to call the clinic should you have any questions or concerns. The clinic phone number is [phone].  Please show the [name] at check-in to the Emergency Department and triage nurse.

## 2017-12-18 NOTE — Progress Notes (Signed)
[facility] [phone]   [phone]  OFFICE PROGRESS NOTE  [name], MD [address] [place] [address]  DIAGNOSIS: Stage IV (T2a, N0, M1b) non-small cell lung cancer, adenocarcinoma with negative EGFR and ALK mutations diagnosed in January 2015 and presented with right upper lobe lung mass in addition to a solitary brain metastasis.  PRIOR THERAPY: 1) Status post stereotactic radiotherapy to a solitary right parietal brain lesion under the care of Dr. [name] on 10/16/2013. 2) Status post palliative radiotherapy to the right lung tumor under the care of Dr. [name] completed on 12/05/2013. 3) Systemic chemotherapy with carboplatin for AUC of 5 and Alimta 500 mg/M2 every 3 weeks. First dose Jan 06 2014. Status post 6 cycles.  CURRENT THERAPY: Systemic chemotherapy with maintenance Alimta 500 MG/M2 every 3 weeks, status post 61 cycles.  INTERVAL HISTORY: Alexis [name] 67 y.o. female returns to the clinic today for follow-up visit accompanied by her husband.  The patient is feeling fine today with no specific complaints.  She continues to tolerate her treatment with Alimta fairly well.  She denied having any chest pain, shortness of breath, cough or hemoptysis.  She denied having any fever or chills.  She has no nausea, vomiting, diarrhea or constipation.  The patient is here today for evaluation before starting cycle #62.   MEDICAL HISTORY: Past Medical History:  Diagnosis Date  . Anxiety   . Anxiety 06/20/2016  . Cervical cancer ([facility])   . Encounter for antineoplastic chemotherapy 07/20/2015  . 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  MEDICATIONS:  Current Outpatient Medications  Medication Sig Dispense Refill  . acetaminophen (TYLENOL) 500 MG tablet Take 500 mg by mouth every 6 (six) hours as needed for mild pain or  headache. Reported on 11/02/2015    . ALPRAZolam (XANAX) 0.25 MG tablet Take 1 tablet (0.25 mg total) by mouth 3 (three) times daily as needed. for anxiety 45 tablet 0  . Ascorbic Acid (VITAMIN C GUMMIE PO) Take 1 each by mouth every morning.    . benzonatate (TESSALON) 100 MG capsule Take 1 capsule (100 mg total) by mouth every 8 (eight) hours. 21 capsule 0  . dexamethasone (DECADRON) 4 MG tablet TAKE 1 TABLET BY MOUTH TWICE A DAY THE DAY BEFORE, DAY OF AND DAY AFTER THE CHEMOTHERAPY EVERY 3 WKS 40 tablet 1  . folic acid (FOLVITE) 1 MG tablet TAKE 1 TABLET BY MOUTH EVERY DAY 90 tablet 0  . Multiple Vitamin (MULTIVITAMIN WITH MINERALS) TABS tablet Take 1 tablet by mouth every morning.    Marland Kitchen omeprazole (PRILOSEC) 20 MG capsule Take 1 capsule (20 mg total) by mouth daily. As needed for reflux or indigestion. 42 capsule PRN  . ondansetron (ZOFRAN) 8 MG tablet Take 1 tablet (8 mg total) by mouth every 8 (eight) hours as needed for nausea or vomiting. 30 tablet 0  . OVER THE COUNTER MEDICATION Take 1 tablet by mouth every morning. (Vitamin A)    . pentoxifylline (TRENTAL) 400 MG CR tablet TAKE 1 TABLET BY MOUTH TWICE A DAY (TAKE WITH VITAMIN E 400MG TWICE A DAY)  3  . prochlorperazine (COMPAZINE) 10 MG tablet Take 1 tablet (10 mg total) by mouth every 6 (six) hours as needed for nausea or vomiting. 60 tablet 0  . senna-docusate (SENOKOT-S)  8.6-50 MG tablet Take 1 tablet by mouth daily. 30 tablet prn   No current facility-administered medications for this visit.     SURGICAL HISTORY:  Past Surgical History:  Procedure Laterality Date  . ABDOMINAL HYSTERECTOMY    . COLOSTOMY TAKEDOWN N/A 07/10/2014   Procedure: LAPAROSCOPIC LYSIS OF ADHESIONS (90 MIN) LAPAROSCOPIC ASSISTED COLOSTOMY CLOSURE, RIGID PROCTOSIGMOIDOSCOPY;  Surgeon: Jackolyn Confer, MD;  Location: [facility];  Service: General;  Laterality: N/A;  . LAPAROTOMY N/A 11/03/2013   Procedure: EXPLORATORY LAPAROTOMY, DRAINAGE OF INTRA  ABDOMINAL  ABSCESSES, MOBILIZATION OF SPLENIC FLEXURE, SIGMOID COLECTOMY WITH COLOSTOMY;  Surgeon: Odis Hollingshead, MD;  Location: [facility];  Service: General;  Laterality: N/A;  . VIDEO BRONCHOSCOPY Bilateral 08/30/2013   Procedure: VIDEO BRONCHOSCOPY WITH FLUORO;  Surgeon: Tanda Rockers, MD;  Location: [facility];  Service: Cardiopulmonary;  Laterality: Bilateral;    REVIEW OF SYSTEMS:  A comprehensive review of systems was negative.   PHYSICAL EXAMINATION: General appearance: alert, cooperative and no distress Head: Normocephalic, without obvious abnormality, atraumatic Neck: no adenopathy, no JVD, supple, symmetrical, trachea midline and thyroid not enlarged, symmetric, no tenderness/mass/nodules Lymph nodes: Cervical, supraclavicular, and axillary nodes normal. Resp: clear to auscultation bilaterally Back: symmetric, no curvature. ROM normal. No CVA tenderness. Cardio: regular rate and rhythm, S1, S2 normal, no murmur, click, rub or gallop GI: soft, non-tender; bowel sounds normal; no masses,  no organomegaly Extremities: extremities normal, atraumatic, no cyanosis or edema  ECOG PERFORMANCE STATUS: 0 - Asymptomatic  Blood pressure 129/60, pulse 98, temperature (!) 97.5 F (36.4 C), temperature source Oral, resp. rate (!) 21, height _0  (1.626 m), weight 176 lb 8 oz (80.1 kg), SpO2 99 %.  LABORATORY DATA: Lab Results  Component Value Date   WBC 8.7 12/18/2017   HGB 12.2 12/18/2017   HCT 36.6 12/18/2017   MCV 95.9 12/18/2017   PLT 316 12/18/2017      Chemistry      Component Value Date/Time   NA 139 11/27/2017 0826   NA 139 08/14/2017 0837   K 4.5 11/27/2017 0826   K 4.4 08/14/2017 0837   CL 105 11/27/2017 0826   CO2 24 11/27/2017 0826   CO2 24 08/14/2017 0837   BUN 12 11/27/2017 0826   BUN 13.3 08/14/2017 0837   CREATININE 0.79 11/27/2017 0826   CREATININE 0.8 08/14/2017 0837      Component Value Date/Time   CALCIUM 9.7 11/27/2017 0826   CALCIUM 9.3 08/14/2017 0837    ALKPHOS 112 11/27/2017 0826   ALKPHOS 107 08/14/2017 0837   AST 12 11/27/2017 0826   AST 12 08/14/2017 0837   ALT 10 11/27/2017 0826   ALT 12 08/14/2017 0837   BILITOT 0.4 11/27/2017 0826   BILITOT 0.37 08/14/2017 0837       RADIOGRAPHIC STUDIES: Ct Chest W Contrast  Result Date: 11/24/2017 CLINICAL DATA:  Lung cancer. Right upper lobe lung mass with solitary brain metastasis. EXAM: CT CHEST, ABDOMEN, AND PELVIS WITH CONTRAST TECHNIQUE: Multidetector CT imaging of the chest, abdomen and pelvis was performed following the standard protocol during bolus administration of intravenous contrast. CONTRAST:  170m ISOVUE-300 IOPAMIDOL (ISOVUE-300) INJECTION 61% COMPARISON:  09/01/2017 FINDINGS: CT CHEST FINDINGS Cardiovascular: The heart size is normal. No pericardial effusion. Atherosclerotic calcification is noted in the wall of the thoracic aorta. Mediastinum/Nodes: No mediastinal lymphadenopathy. 7 mm short axis right hilar lymph node measured previously is similar today. No left hilar lymphadenopathy. The esophagus has normal imaging features. There is no axillary lymphadenopathy.  Lungs/Pleura: Right apical lesion is similar measuring 3.7 x 3.3 cm today compared to 3.6 x 3.4 cm previously. Left apical pleuroparenchymal scarring is stable. Centrilobular emphysema again noted. 3 mm ground-glass nodule in the left upper lobe is new in the interval and likely related to an infectious or inflammatory focus of alveolitis. No pleural effusion. Musculoskeletal: Bone windows reveal no worrisome lytic or sclerotic osseous lesions. CT ABDOMEN PELVIS FINDINGS Hepatobiliary: No focal abnormality within the liver parenchyma. There is no evidence for gallstones, gallbladder wall thickening, or pericholecystic fluid. No intrahepatic or extrahepatic biliary dilation. Pancreas: No focal mass lesion. No dilatation of the main duct. No intraparenchymal cyst. No peripancreatic edema. Spleen: No splenomegaly. No focal mass  lesion. Adrenals/Urinary Tract: No adrenal nodule or mass. Kidneys are unremarkable. No evidence for hydroureter. The urinary bladder appears normal for the degree of distention. Stomach/Bowel: Stomach is nondistended. No gastric wall thickening. No evidence of outlet obstruction. Duodenum is normally positioned as is the ligament of Treitz. No small bowel wall thickening. No small bowel dilatation. The terminal ileum is normal. The appendix is not visualized, but there is no edema or inflammation in the region of the cecum. Anastomotic suture line identified distal sigmoid colon. No gross colonic mass. No colonic wall thickening. No substantial diverticular change. Vascular/Lymphatic: There is abdominal aortic atherosclerosis without aneurysm. There is no gastrohepatic or hepatoduodenal ligament lymphadenopathy. No intraperitoneal or retroperitoneal lymphadenopathy. No pelvic sidewall lymphadenopathy. Reproductive: Uterus surgically absent.  There is no adnexal mass. Other: No intraperitoneal free fluid. Musculoskeletal: Bone windows reveal no worrisome lytic or sclerotic osseous lesions. Similar appearance of small paraumbilical hernias containing only fat. IMPRESSION: 1. No change in the right apical nodule in the interval since the prior study compatible with treated disease. 2. Tiny ground-glass nodule left upper lobe likely post infectious/inflammatory alveolitis. Attention on follow-up recommended. 3. No evidence for new metastatic disease in the chest, abdomen, or pelvis. 4.  Aortic Atherosclerois (ICD10-170.0) 5.  Emphysema. ([id number]) Electronically Signed   By: Misty Stanley M.D.   On: 11/24/2017 13:51   Ct Abdomen Pelvis W Contrast  Result Date: 11/24/2017 CLINICAL DATA:  Lung cancer. Right upper lobe lung mass with solitary brain metastasis. EXAM: CT CHEST, ABDOMEN, AND PELVIS WITH CONTRAST TECHNIQUE: Multidetector CT imaging of the chest, abdomen and pelvis was performed following the standard  protocol during bolus administration of intravenous contrast. CONTRAST:  163m ISOVUE-300 IOPAMIDOL (ISOVUE-300) INJECTION 61% COMPARISON:  09/01/2017 FINDINGS: CT CHEST FINDINGS Cardiovascular: The heart size is normal. No pericardial effusion. Atherosclerotic calcification is noted in the wall of the thoracic aorta. Mediastinum/Nodes: No mediastinal lymphadenopathy. 7 mm short axis right hilar lymph node measured previously is similar today. No left hilar lymphadenopathy. The esophagus has normal imaging features. There is no axillary lymphadenopathy. Lungs/Pleura: Right apical lesion is similar measuring 3.7 x 3.3 cm today compared to 3.6 x 3.4 cm previously. Left apical pleuroparenchymal scarring is stable. Centrilobular emphysema again noted. 3 mm ground-glass nodule in the left upper lobe is new in the interval and likely related to an infectious or inflammatory focus of alveolitis. No pleural effusion. Musculoskeletal: Bone windows reveal no worrisome lytic or sclerotic osseous lesions. CT ABDOMEN PELVIS FINDINGS Hepatobiliary: No focal abnormality within the liver parenchyma. There is no evidence for gallstones, gallbladder wall thickening, or pericholecystic fluid. No intrahepatic or extrahepatic biliary dilation. Pancreas: No focal mass lesion. No dilatation of the main duct. No intraparenchymal cyst. No peripancreatic edema. Spleen: No splenomegaly. No focal mass lesion. Adrenals/Urinary Tract: No  adrenal nodule or mass. Kidneys are unremarkable. No evidence for hydroureter. The urinary bladder appears normal for the degree of distention. Stomach/Bowel: Stomach is nondistended. No gastric wall thickening. No evidence of outlet obstruction. Duodenum is normally positioned as is the ligament of Treitz. No small bowel wall thickening. No small bowel dilatation. The terminal ileum is normal. The appendix is not visualized, but there is no edema or inflammation in the region of the cecum. Anastomotic suture  line identified distal sigmoid colon. No gross colonic mass. No colonic wall thickening. No substantial diverticular change. Vascular/Lymphatic: There is abdominal aortic atherosclerosis without aneurysm. There is no gastrohepatic or hepatoduodenal ligament lymphadenopathy. No intraperitoneal or retroperitoneal lymphadenopathy. No pelvic sidewall lymphadenopathy. Reproductive: Uterus surgically absent.  There is no adnexal mass. Other: No intraperitoneal free fluid. Musculoskeletal: Bone windows reveal no worrisome lytic or sclerotic osseous lesions. Similar appearance of small paraumbilical hernias containing only fat. IMPRESSION: 1. No change in the right apical nodule in the interval since the prior study compatible with treated disease. 2. Tiny ground-glass nodule left upper lobe likely post infectious/inflammatory alveolitis. Attention on follow-up recommended. 3. No evidence for new metastatic disease in the chest, abdomen, or pelvis. 4.  Aortic Atherosclerois (ICD10-170.0) 5.  Emphysema. ([id number]) Electronically Signed   By: Misty Stanley M.D.   On: 11/24/2017 13:51    ASSESSMENT AND PLAN:  This is a very pleasant 67 years old white female with metastatic non-small cell lung cancer, adenocarcinoma status post induction systemic chemotherapy with carboplatin and Alimta with partial response. She is currently on maintenance treatment with single agent Alimta status post 61 cycles. The patient continues to tolerate this treatment well with no concerning complaints.  I recommended for her to proceed with cycle #62 today as a scheduled. I will see her back for follow-up visit in 3 weeks for evaluation before starting cycle #6 history. She was advised to call immediately if she has any concerning symptoms in the interval. The patient voices understanding of current disease status and treatment options and is in agreement with the current care plan. All questions were answered. The patient knows to  call the clinic with any problems, questions or concerns. We can certainly see the patient much sooner if necessary.  Disclaimer: This note was dictated with voice recognition software. Similar sounding words can inadvertently be transcribed and may not be corrected upon review.

## 2017-12-28 NOTE — Progress Notes (Signed)
Alexis [name] 67 y.o. woman with Metastatic non-small cell lung cancer with a solitary brain metastasis completed radiation SRS 01-12-15,review 12-29-17 MRI w wo contrast,FU.  Headache:No Pain:No Dizziness:No Nausea/vomiting:No Ringing in ears:No Visual changes (Blurred/ diplopia double vision,blind spots, and peripheral vsion changes):No Fatigue:After chemotherapy Cognitive changes:Alert and oriented x 3. Wt Readings from Last 3 Encounters:  01/01/18 178 lb 12.8 oz (81.1 kg)  12/18/17 176 lb 8 oz (80.1 kg)  11/27/17 175 lb (79.4 kg)  BP 130/71 (BP Location: Right Arm, Patient Position: Sitting, Cuff Size: Normal)   Pulse 96   Temp 98.3 F (36.8 C) (Oral)   Resp 18   Ht 5\' 4"  (1.626 m)   Wt 178 lb 12.8 oz (81.1 kg)   SpO2 100%   BMI 30.69 kg/m

## 2017-12-29 ENCOUNTER — Ambulatory Visit
Admission: RE | Admit: 2017-12-29 | Discharge: 2017-12-29 | Disposition: A | Discharge status: Home / Self Care | Payer: Medicare Other | Source: Ambulatory Visit | Attending: Radiation Oncology | Admitting: Radiation Oncology

## 2017-12-29 DIAGNOSIS — C7931 Secondary malignant neoplasm of brain: Secondary | ICD-10-CM

## 2017-12-29 DIAGNOSIS — C50919 Malignant neoplasm of unspecified site of unspecified female breast: Secondary | ICD-10-CM | POA: Diagnosis not present

## 2017-12-29 DIAGNOSIS — C7949 Secondary malignant neoplasm of other parts of nervous system: Principal | ICD-10-CM

## 2017-12-29 MED ORDER — GADOBENATE DIMEGLUMINE 529 MG/ML IV SOLN
17.0000 mL | Freq: Once | INTRAVENOUS | Status: AC | PRN
  Administered 2017-12-29: 17 mL via INTRAVENOUS

## 2018-01-01 ENCOUNTER — Encounter: Payer: Self-pay | Admitting: Radiation Oncology

## 2018-01-01 ENCOUNTER — Other Ambulatory Visit: Payer: Self-pay

## 2018-01-01 ENCOUNTER — Ambulatory Visit
Admission: RE | Admit: 2018-01-01 | Discharge: 2018-01-01 | Disposition: A | Discharge status: Home / Self Care | Payer: Medicare Other | Source: Ambulatory Visit | Attending: Radiation Oncology | Admitting: Radiation Oncology

## 2018-01-01 VITALS — BP 130/71 | HR 96 | Temp 98.3°F | Resp 18 | Ht 64.0 in | Wt 178.8 lb

## 2018-01-01 DIAGNOSIS — Z79899 Other long term (current) drug therapy: Secondary | ICD-10-CM | POA: Insufficient documentation

## 2018-01-01 DIAGNOSIS — C3411 Malignant neoplasm of upper lobe, right bronchus or lung: Secondary | ICD-10-CM | POA: Insufficient documentation

## 2018-01-01 DIAGNOSIS — Z8541 Personal history of malignant neoplasm of cervix uteri: Secondary | ICD-10-CM | POA: Insufficient documentation

## 2018-01-01 DIAGNOSIS — F419 Anxiety disorder, unspecified: Secondary | ICD-10-CM | POA: Diagnosis not present

## 2018-01-01 DIAGNOSIS — R600 Localized edema: Secondary | ICD-10-CM | POA: Insufficient documentation

## 2018-01-01 DIAGNOSIS — Z87891 Personal history of nicotine dependence: Secondary | ICD-10-CM | POA: Insufficient documentation

## 2018-01-01 DIAGNOSIS — C7949 Secondary malignant neoplasm of other parts of nervous system: Secondary | ICD-10-CM

## 2018-01-01 DIAGNOSIS — C7931 Secondary malignant neoplasm of brain: Secondary | ICD-10-CM | POA: Insufficient documentation

## 2018-01-01 DIAGNOSIS — Z08 Encounter for follow-up examination after completed treatment for malignant neoplasm: Secondary | ICD-10-CM | POA: Diagnosis not present

## 2018-01-01 NOTE — Addendum Note (Signed)
Encounter addended by: Hayden Pedro, PA-C on: 01/01/2018 8:55 PM  Actions taken: Follow-up modified

## 2018-01-01 NOTE — Progress Notes (Signed)
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   Interval Since Last Radiation: 4 years  10/28/2013 through 12/05/2013: The patient was treated to the right lung tumor to a dose of 50 gray in 25 fractions using a 3-D conformal technique. Daily image guidance was used for the patient's treatment.  10/16/2013 SRS Treatment: PTV1: Rt Parietal 64mm target was treated using 3 Arcs to a prescription dose of 20 Gy. ExacTrac Snap verification was performed for each couch angle.  Narrative:  The patient returns today for routine follow-up. In summary this is a 67 y.o. patient with a history of metastatic lung cancer to the brain who was treated in two sessions in 2015 for her brain disease, followed by local control to the right lung. Since her treatment, she has done well and continues to be NED in the brain, and continues to remain with stable disease on systemic alimta. She is due to begin cycle #54 of this. 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  At her last interval we discontinued Trental, but she was not keen on discontinuing Vitamin E so she's remained on this. She proceeded with MRI of the brain on 12/29/17, and comes today for review of this. Fortunately no progressive features were noted. She did have mild mucous thickening on her MRI of the sinuses and mastoid regions.  On review of systems, the patient reports that she is doing well overall. She reports no headaches or visual changes. She does state she had some ear wax removed by her PCP and has had some feelings of  being under water. She denies any chest pain, shortness of breath, cough, fevers, chills, night sweats, unintended weight changes. She denies any bowel or bladder disturbances, and denies abdominal pain, nausea or vomiting. She denies any new musculoskeletal or joint aches or pains, new skin lesions or concerns. A complete review of systems is obtained and is otherwise negative.   Past Medical History:  Past Medical History:  Diagnosis Date  . Anxiety   . Anxiety 06/20/2016  . Cervical cancer ([place])   . Encounter for antineoplastic chemotherapy 07/20/2015  . 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 ABDOMINAL HYSTERECTOMY    . COLOSTOMY TAKEDOWN N/A 07/10/2014   Procedure: LAPAROSCOPIC LYSIS OF ADHESIONS (90 MIN) LAPAROSCOPIC ASSISTED COLOSTOMY CLOSURE, RIGID PROCTOSIGMOIDOSCOPY;  Surgeon: Jackolyn Confer, MD;  Location: [facility];  Service: General;  Laterality: N/A;  . LAPAROTOMY N/A 11/03/2013   Procedure: EXPLORATORY LAPAROTOMY, DRAINAGE OF INTRA  ABDOMINAL ABSCESSES, MOBILIZATION OF SPLENIC FLEXURE, SIGMOID COLECTOMY WITH COLOSTOMY;  Surgeon: Odis Hollingshead, MD;  Location: [facility];  Service: General;  Laterality: N/A;  . VIDEO BRONCHOSCOPY Bilateral 08/30/2013   Procedure: VIDEO BRONCHOSCOPY WITH FLUORO;  Surgeon: Tanda Rockers, MD;  Location: [facility];  Service: Cardiopulmonary;  Laterality: Bilateral;    Social History:  Social History   Socioeconomic History  . Marital status: Married  Spouse name: Not on file  . Number of children: Not on file  . Years of education: Not on file  . Highest education level: Not on file  Occupational History  . 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 Financial resource strain: Not on file  . Food insecurity:    Worry: Not on file    Inability: Not on file  .  Transportation needs:    Medical: Not on file    Non-medical: Not on file  Tobacco Use  . Smoking status: Former Smoker    Packs/day: 1.00    Years: 40.00    Pack years: 40.00    Types: Cigarettes    Last attempt to quit: 09/27/2013    Years since quitting: 4.2  . Smokeless tobacco: Never Used  Substance and Sexual Activity  . Alcohol use: No  . Drug use: No  . Sexual activity: Yes  Lifestyle  . Physical activity:    Days per week: Not on file    Minutes per session: Not on file  . Stress: Not on file  Relationships  . Social connections:    Talks on phone: Not on file    Gets together: Not on file    Attends religious service: Not on file    Active member of club or organization: Not on file    Attends meetings of clubs or organizations: Not on file    Relationship status: Not on file  . Intimate partner violence:    Fear of current or ex partner: Not on file    Emotionally abused: Not on file    Physically abused: Not on file    Forced sexual activity: Not on file  Other Topics Concern  . Not on file  Social History Narrative  . Not on file  The patient is married and accompanied by her husband.  She lives in [place], [place].  Family History: Family History  Problem Relation Age of Onset  . Emphysema Father        smoked  . Lung cancer Father        smoked  . Cancer Mother   . Hypertension Mother   . COPD Mother     ALLERGIES:  is allergic to codeine.  Meds: Current Outpatient Medications  Medication Sig Dispense Refill  . acetaminophen (TYLENOL) 500 MG tablet Take 500 mg by mouth every 6 (six) hours as needed for mild pain or headache. Reported on 11/02/2015    . ALPRAZolam (XANAX) 0.25 MG tablet Take 1 tablet (0.25 mg total) by mouth 3 (three) times daily as needed. for anxiety 45 tablet 0  . Ascorbic Acid (VITAMIN C GUMMIE PO) Take 1 each by mouth every morning.    Marland Kitchen dexamethasone (DECADRON) 4 MG tablet TAKE 1 TABLET BY MOUTH TWICE A DAY THE DAY  BEFORE, DAY OF AND DAY AFTER THE CHEMOTHERAPY EVERY 3 WKS 40 tablet 1  . folic acid (FOLVITE) 1 MG tablet TAKE 1 TABLET BY MOUTH EVERY DAY 90 tablet 0  . Multiple Vitamin (MULTIVITAMIN WITH MINERALS) TABS tablet Take 1 tablet by mouth every morning.    Marland Kitchen omeprazole (PRILOSEC) 20 MG capsule Take 1 capsule (20 mg total) by mouth daily. As needed for reflux or indigestion. 42 capsule PRN  . OVER THE COUNTER MEDICATION Take 1 tablet by mouth every morning. (Vitamin A)    . pentoxifylline (TRENTAL) 400 MG CR tablet TAKE 1 TABLET BY MOUTH TWICE A DAY (TAKE WITH VITAMIN  E 400MG  TWICE A DAY)  3  . senna-docusate (SENOKOT-S) 8.6-50 MG tablet Take 1 tablet by mouth daily. 30 tablet prn  . benzonatate (TESSALON) 100 MG capsule Take 1 capsule (100 mg total) by mouth every 8 (eight) hours. (Patient not taking: Reported on 01/01/2018) 21 capsule 0  . ondansetron (ZOFRAN) 8 MG tablet Take 1 tablet (8 mg total) by mouth every 8 (eight) hours as needed for nausea or vomiting. (Patient not taking: Reported on 01/01/2018) 30 tablet 0  . prochlorperazine (COMPAZINE) 10 MG tablet Take 1 tablet (10 mg total) by mouth every 6 (six) hours as needed for nausea or vomiting. (Patient not taking: Reported on 01/01/2018) 60 tablet 0   No current facility-administered medications for this encounter.     Physical Findings:  height is 5\' 4"  (1.626 m) and weight is 178 lb 12.8 oz (81.1 kg). Her oral temperature is 98.3 F (36.8 C). Her blood pressure is 130/71 and her pulse is 96. Her respiration is 18 and oxygen saturation is 100%.  In general this is a well appearing Caucasian female in no acute distress. She's alert and oriented x4 and appropriate throughout the examination. She is normocephalic, atraumatic. EOMS are intact. Ears are examined bilaterally revealing no cerumen impaction bilaterally but mild cerumen removed with a curette, and serous fluid posterior to bilateral TMs. Cardiopulmonary assessment is negative for acute  distress and she exhibits normal effort.  No focal changes are noted neurologically.  She is in excellent spirits.  Lab Findings: Lab Results  Component Value Date   WBC 8.7 12/18/2017   HGB 12.2 12/18/2017   HCT 36.6 12/18/2017   MCV 95.9 12/18/2017   PLT 316 12/18/2017     Radiographic Findings: Mr [name] ZO Contrast  Result Date: 12/29/2017 CLINICAL DATA:  Non-small-cell lung cancer. Metastatic disease to brain treated with stereotactic radio surgery EXAM: MRI HEAD WITHOUT AND WITH CONTRAST TECHNIQUE: Multiplanar, multiecho pulse sequences of the brain and surrounding structures were obtained without and with intravenous contrast. CONTRAST:  42mL MULTIHANCE GADOBENATE DIMEGLUMINE 529 MG/ML IV SOLN COMPARISON:  MRI head 06/29/2017 FINDINGS: Brain: Rim enhancing lesion in the right parietal lobe is stable. This has irregular enhancement and measures approximately 11 x 17 mm. Surrounding hyperintensity in the white matter also stable. Chronic hemorrhage is present within the enhancing lesion. No other enhancing lesions. No new area of edema. Negative for acute infarct. Chronic microvascular ischemic changes in the white matter stable. Vascular: Normal arterial flow voids Skull and upper cervical spine: Negative. Stable cyst in the left parietal scalp Sinuses/Orbits: Mild mucosal edema paranasal sinuses and mastoid sinus bilaterally. Bilateral cataract surgery Other: None IMPRESSION: No interval change from the prior MRI. 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 No new metastatic deposits. Electronically Signed   By: Franchot Gallo M.D.   On: 12/29/2017 14:28    Impression/Plan: 1. 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  The patient continues on systemic Alimta with Dr. Julien Nordmann and has remained stable. She appears to be  doing very well radiographically as well as clinically. We will follow up with her and a repeat MRI in 6 months tim per NCCN guidelines. She is in agreement.    2. Radiation necrosis. The patient is asymptomatic at this time and in review of her case, she does not have evidence of concern for  necrosis on her most recent imaging. She has continued to take Vitamin E, but at this time was counseled to discontinue this. She will let us know if she's having any symptoms of concern prior to her next visit. 3. Serous fluid posterior to TMs. The patient is somewhat symptomatic and we discussed OTC sudafed for her symptoms. We will follow this expectantly at her next visit.    Carola Rhine, PAC

## 2018-01-08 ENCOUNTER — Inpatient Hospital Stay: Payer: Medicare Other

## 2018-01-08 ENCOUNTER — Encounter: Payer: Self-pay | Admitting: Internal Medicine

## 2018-01-08 ENCOUNTER — Inpatient Hospital Stay: Payer: Medicare Other | Attending: Internal Medicine | Admitting: Internal Medicine

## 2018-01-08 ENCOUNTER — Telehealth: Payer: Self-pay | Admitting: Internal Medicine

## 2018-01-08 VITALS — BP 120/58 | HR 95 | Temp 97.8°F | Resp 18 | Ht 64.0 in | Wt 178.7 lb

## 2018-01-08 DIAGNOSIS — F411 Generalized anxiety disorder: Secondary | ICD-10-CM

## 2018-01-08 DIAGNOSIS — C3490 Malignant neoplasm of unspecified part of unspecified bronchus or lung: Secondary | ICD-10-CM

## 2018-01-08 DIAGNOSIS — C3411 Malignant neoplasm of upper lobe, right bronchus or lung: Secondary | ICD-10-CM | POA: Diagnosis not present

## 2018-01-08 DIAGNOSIS — C7949 Secondary malignant neoplasm of other parts of nervous system: Secondary | ICD-10-CM

## 2018-01-08 DIAGNOSIS — F419 Anxiety disorder, unspecified: Secondary | ICD-10-CM

## 2018-01-08 DIAGNOSIS — C7931 Secondary malignant neoplasm of brain: Secondary | ICD-10-CM | POA: Diagnosis not present

## 2018-01-08 DIAGNOSIS — Z5111 Encounter for antineoplastic chemotherapy: Secondary | ICD-10-CM

## 2018-01-08 DIAGNOSIS — M545 Low back pain: Secondary | ICD-10-CM | POA: Diagnosis not present

## 2018-01-08 LAB — COMPREHENSIVE METABOLIC PANEL
ALT: 13 U/L (ref 0–55)
AST: 11 U/L (ref 5–34)
Albumin: 3.6 g/dL (ref 3.5–5.0)
Alkaline Phosphatase: 123 U/L (ref 40–150)
Anion gap: 9 (ref 3–11)
BUN: 15 mg/dL (ref 7–26)
CO2: 23 mmol/L (ref 22–29)
Calcium: 9.4 mg/dL (ref 8.4–10.4)
Chloride: 107 mmol/L (ref 98–109)
Creatinine, Ser: 0.82 mg/dL (ref 0.60–1.10)
GFR calc Af Amer: >60 mL/min (ref >60)
GFR calc non Af Amer: >60 mL/min (ref >60)
Glucose, Bld: 157 mg/dL — ABNORMAL HIGH (ref 70–140)
Potassium: 4.4 mmol/L (ref 3.5–5.1)
Sodium: 139 mmol/L (ref 136–145)
Total Bilirubin: 0.3 mg/dL (ref 0.2–1.2)
Total Protein: 7 g/dL (ref 6.4–8.3)

## 2018-01-08 LAB — CBC WITH DIFFERENTIAL/PLATELET
Basophils Absolute: 0 10*3/uL (ref 0.0–0.1)
Basophils Relative: 0 %
Eosinophils Absolute: 0 10*3/uL (ref 0.0–0.5)
Eosinophils Relative: 0 %
HCT: 38.9 % (ref 34.8–46.6)
Hemoglobin: 12.3 g/dL (ref 11.6–15.9)
Lymphocytes Relative: 4 %
Lymphs Abs: 0.5 10*3/uL — ABNORMAL LOW (ref 0.9–3.3)
MCH: 31.7 pg (ref 25.1–34.0)
MCHC: 31.6 g/dL (ref 31.5–36.0)
MCV: 100.3 fL (ref 79.5–101.0)
Monocytes Absolute: 0.7 10*3/uL (ref 0.1–0.9)
Monocytes Relative: 6 %
Neutro Abs: 10.1 10*3/uL — ABNORMAL HIGH (ref 1.5–6.5)
Neutrophils Relative %: 90 %
Platelets: 340 10*3/uL (ref 145–400)
RBC: 3.88 MIL/uL (ref 3.70–5.45)
RDW: 15.6 % — ABNORMAL HIGH (ref 11.2–14.5)
WBC: 11.2 10*3/uL — ABNORMAL HIGH (ref 3.9–10.3)

## 2018-01-08 MED ORDER — ALPRAZOLAM 0.25 MG PO TABS
0.2500 mg | ORAL_TABLET | Freq: Three times a day (TID) | ORAL | 0 refills | Status: DC | PRN
Start: 2018-01-08 — End: 2018-02-19

## 2018-01-08 MED ORDER — SODIUM CHLORIDE 0.9 % IV SOLN
500.0000 mg/m2 | Freq: Once | INTRAVENOUS | Status: AC
  Administered 2018-01-08: 900 mg via INTRAVENOUS
  Filled 2018-01-08: qty 20

## 2018-01-08 MED ORDER — ONDANSETRON HCL 8 MG PO TABS
ORAL_TABLET | ORAL | Status: AC
  Filled 2018-01-08: qty 1

## 2018-01-08 MED ORDER — SODIUM CHLORIDE 0.9 % IV SOLN
Freq: Once | INTRAVENOUS | Status: AC
  Administered 2018-01-08: 11:00:00 via INTRAVENOUS

## 2018-01-08 MED ORDER — DEXAMETHASONE SODIUM PHOSPHATE 10 MG/ML IJ SOLN
10.0000 mg | Freq: Once | INTRAMUSCULAR | Status: AC
  Administered 2018-01-08: 10 mg via INTRAVENOUS

## 2018-01-08 MED ORDER — ONDANSETRON HCL 8 MG PO TABS
8.0000 mg | ORAL_TABLET | Freq: Once | ORAL | Status: AC
  Administered 2018-01-08: 8 mg via ORAL

## 2018-01-08 MED ORDER — DEXAMETHASONE SODIUM PHOSPHATE 10 MG/ML IJ SOLN
INTRAMUSCULAR | Status: AC
  Filled 2018-01-08: qty 1

## 2018-01-08 NOTE — Patient Instructions (Signed)
[facility] Discharge Instructions for Patients Receiving Chemotherapy  Today you received the following chemotherapy agents alimta  To help prevent nausea and vomiting after your treatment, we encourage you to take your nausea medication as directed   If you develop nausea and vomiting that is not controlled by your nausea medication, call the clinic.   BELOW ARE SYMPTOMS THAT SHOULD BE REPORTED IMMEDIATELY:  *FEVER GREATER THAN 100.5 F  *CHILLS WITH OR WITHOUT FEVER  NAUSEA AND VOMITING THAT IS NOT CONTROLLED WITH YOUR NAUSEA MEDICATION  *UNUSUAL SHORTNESS OF BREATH  *UNUSUAL BRUISING OR BLEEDING  TENDERNESS IN MOUTH AND THROAT WITH OR WITHOUT PRESENCE OF ULCERS  *URINARY PROBLEMS  *BOWEL PROBLEMS  UNUSUAL RASH Items with * indicate a potential emergency and should be followed up as soon as possible.  Feel free to call the clinic should you have any questions or concerns. The clinic phone number is [phone].  Please show the [facility] at check-in to the Emergency Department and triage nurse.

## 2018-01-08 NOTE — Progress Notes (Signed)
[facility] [phone]   [phone]  OFFICE PROGRESS NOTE  [name], MD [address] [place] [address]  DIAGNOSIS: Stage IV (T2a, N0, M1b) non-small cell lung cancer, adenocarcinoma with negative EGFR and ALK mutations diagnosed in January 2015 and presented with right upper lobe lung mass in addition to a solitary brain metastasis.  PRIOR THERAPY: 1) Status post stereotactic radiotherapy to a solitary right parietal brain lesion under the care of Dr. [name] on 10/16/2013. 2) Status post palliative radiotherapy to the right lung tumor under the care of Dr. [name] completed on 12/05/2013. 3) Systemic chemotherapy with carboplatin for AUC of 5 and Alimta 500 mg/M2 every 3 weeks. First dose Jan 06 2014. Status post 6 cycles.  CURRENT THERAPY: Systemic chemotherapy with maintenance Alimta 500 MG/M2 every 3 weeks, status post 62 cycles.  INTERVAL HISTORY: Alexis [name] 67 y.o. female returns to the clinic today for follow-up visit accompanied by her husband.  The patient is feeling fine today with no specific complaints except for low back pain after she worked in the yard the last few days.  She denied having any chest pain, shortness breath, cough or hemoptysis.  She denied having any fever or chills.  She has no nausea, vomiting, diarrhea or constipation.  She continues to tolerate her treatment with chemotherapy fairly well.  She is here for evaluation before starting cycle #63.   MEDICAL HISTORY: Past Medical History:  Diagnosis Date  . Anxiety   . Anxiety 06/20/2016  . Cervical cancer ([place])   . Encounter for antineoplastic chemotherapy 07/20/2015  . 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  MEDICATIONS:  Current Outpatient Medications  Medication Sig Dispense Refill  . acetaminophen (TYLENOL) 500 MG tablet Take 500 mg by  mouth every 6 (six) hours as needed for mild pain or headache. Reported on 11/02/2015    . ALPRAZolam (XANAX) 0.25 MG tablet Take 1 tablet (0.25 mg total) by mouth 3 (three) times daily as needed. for anxiety 45 tablet 0  . Ascorbic Acid (VITAMIN C GUMMIE PO) Take 1 each by mouth every morning.    . benzonatate (TESSALON) 100 MG capsule Take 1 capsule (100 mg total) by mouth every 8 (eight) hours. (Patient not taking: Reported on 01/01/2018) 21 capsule 0  . dexamethasone (DECADRON) 4 MG tablet TAKE 1 TABLET BY MOUTH TWICE A DAY THE DAY BEFORE, DAY OF AND DAY AFTER THE CHEMOTHERAPY EVERY 3 WKS 40 tablet 1  . folic acid (FOLVITE) 1 MG tablet TAKE 1 TABLET BY MOUTH EVERY DAY 90 tablet 0  . Multiple Vitamin (MULTIVITAMIN WITH MINERALS) TABS tablet Take 1 tablet by mouth every morning.    Marland Kitchen omeprazole (PRILOSEC) 20 MG capsule Take 1 capsule (20 mg total) by mouth daily. As needed for reflux or indigestion. 42 capsule PRN  . ondansetron (ZOFRAN) 8 MG tablet Take 1 tablet (8 mg total) by mouth every 8 (eight) hours as needed for nausea or vomiting. (Patient not taking: Reported on 01/01/2018) 30 tablet 0  . OVER THE COUNTER MEDICATION Take 1 tablet by mouth every morning. (Vitamin A)    . pentoxifylline (TRENTAL) 400 MG CR tablet TAKE 1 TABLET BY MOUTH TWICE A DAY (TAKE WITH VITAMIN E 400MG TWICE A DAY)  3  . prochlorperazine (COMPAZINE) 10 MG tablet Take  1 tablet (10 mg total) by mouth every 6 (six) hours as needed for nausea or vomiting. (Patient not taking: Reported on 01/01/2018) 60 tablet 0  . senna-docusate (SENOKOT-S) 8.6-50 MG tablet Take 1 tablet by mouth daily. 30 tablet prn   No current facility-administered medications for this visit.     SURGICAL HISTORY:  Past Surgical History:  Procedure Laterality Date  . ABDOMINAL HYSTERECTOMY    . COLOSTOMY TAKEDOWN N/A 07/10/2014   Procedure: LAPAROSCOPIC LYSIS OF ADHESIONS (90 MIN) LAPAROSCOPIC ASSISTED COLOSTOMY CLOSURE, RIGID PROCTOSIGMOIDOSCOPY;   Surgeon: Jackolyn Confer, MD;  Location: [facility];  Service: General;  Laterality: N/A;  . LAPAROTOMY N/A 11/03/2013   Procedure: EXPLORATORY LAPAROTOMY, DRAINAGE OF INTRA  ABDOMINAL ABSCESSES, MOBILIZATION OF SPLENIC FLEXURE, SIGMOID COLECTOMY WITH COLOSTOMY;  Surgeon: Odis Hollingshead, MD;  Location: [facility];  Service: General;  Laterality: N/A;  . VIDEO BRONCHOSCOPY Bilateral 08/30/2013   Procedure: VIDEO BRONCHOSCOPY WITH FLUORO;  Surgeon: Tanda Rockers, MD;  Location: [facility];  Service: Cardiopulmonary;  Laterality: Bilateral;    REVIEW OF SYSTEMS:  A comprehensive review of systems was negative except for: Musculoskeletal: positive for back pain   PHYSICAL EXAMINATION: General appearance: alert, cooperative and no distress Head: Normocephalic, without obvious abnormality, atraumatic Neck: no adenopathy, no JVD, supple, symmetrical, trachea midline and thyroid not enlarged, symmetric, no tenderness/mass/nodules Lymph nodes: Cervical, supraclavicular, and axillary nodes normal. Resp: clear to auscultation bilaterally Back: symmetric, no curvature. ROM normal. No CVA tenderness. Cardio: regular rate and rhythm, S1, S2 normal, no murmur, click, rub or gallop GI: soft, non-tender; bowel sounds normal; no masses,  no organomegaly Extremities: extremities normal, atraumatic, no cyanosis or edema  ECOG PERFORMANCE STATUS: 0 - Asymptomatic  Blood pressure (!) 120/58, pulse 95, temperature 97.8 F (36.6 C), temperature source Oral, resp. rate 18, height 5' 4"  (1.626 m), weight 178 lb 11.2 oz (81.1 kg), SpO2 100 %.  LABORATORY DATA: Lab Results  Component Value Date   WBC 11.2 (H) 01/08/2018   HGB 12.3 01/08/2018   HCT 38.9 01/08/2018   MCV 100.3 01/08/2018   PLT 340 01/08/2018      Chemistry      Component Value Date/Time   NA 139 01/08/2018 0844   NA 139 08/14/2017 0837   K 4.4 01/08/2018 0844   K 4.4 08/14/2017 0837   CL 107 01/08/2018 0844   CO2 23 01/08/2018 0844   CO2 24  08/14/2017 0837   BUN 15 01/08/2018 0844   BUN 13.3 08/14/2017 0837   CREATININE 0.82 01/08/2018 0844   CREATININE 0.8 08/14/2017 0837      Component Value Date/Time   CALCIUM 9.4 01/08/2018 0844   CALCIUM 9.3 08/14/2017 0837   ALKPHOS 123 01/08/2018 0844   ALKPHOS 107 08/14/2017 0837   AST 11 01/08/2018 0844   AST 12 08/14/2017 0837   ALT 13 01/08/2018 0844   ALT 12 08/14/2017 0837   BILITOT 0.3 01/08/2018 0844   BILITOT 0.37 08/14/2017 0837       RADIOGRAPHIC STUDIES: Mr [name] DZ Contrast  Result Date: 12/29/2017 CLINICAL DATA:  Non-small-cell lung cancer. Metastatic disease to brain treated with stereotactic radio surgery EXAM: MRI HEAD WITHOUT AND WITH CONTRAST TECHNIQUE: Multiplanar, multiecho pulse sequences of the brain and surrounding structures were obtained without and with intravenous contrast. CONTRAST:  38m MULTIHANCE GADOBENATE DIMEGLUMINE 529 MG/ML IV SOLN COMPARISON:  MRI head 06/29/2017 FINDINGS: Brain: Rim enhancing lesion in the right parietal lobe is stable. This has irregular enhancement and measures  approximately 11 x 17 mm. Surrounding hyperintensity in the white matter also stable. Chronic hemorrhage is present within the enhancing lesion. No other enhancing lesions. No new area of edema. Negative for acute infarct. Chronic microvascular ischemic changes in the white matter stable. Vascular: Normal arterial flow voids Skull and upper cervical spine: Negative. Stable cyst in the left parietal scalp Sinuses/Orbits: Mild mucosal edema paranasal sinuses and mastoid sinus bilaterally. Bilateral cataract surgery Other: None IMPRESSION: No interval change from the prior MRI. 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 No new metastatic deposits. Electronically Signed   By: Franchot Gallo M.D.   On: 12/29/2017 14:28    ASSESSMENT AND PLAN:  This is a very pleasant 67 years old white female  with metastatic non-small cell lung cancer, adenocarcinoma status post induction systemic chemotherapy with carboplatin and Alimta with partial response. She is currently on maintenance treatment with single agent Alimta status post 62 cycles. The patient continues to tolerate this treatment well with no concerning complaints.  I recommended for her to proceed with cycle #63 today as scheduled. I will see her back for follow-up visit in 3 weeks for evaluation after repeating CT scan of the chest, abdomen and pelvis for restaging of her disease. For anxiety, I gave her a refill of Xanax today. She was advised to call immediately if she has any concerning symptoms in the interval. The patient voices understanding of current disease status and treatment options and is in agreement with the current care plan. All questions were answered. The patient knows to call the clinic with any problems, questions or concerns. We can certainly see the patient much sooner if necessary.  Disclaimer: This note was dictated with voice recognition software. Similar sounding words can inadvertently be transcribed and may not be corrected upon review.

## 2018-01-08 NOTE — Telephone Encounter (Signed)
Appointments scheduled Ava/Calendar printed per 5/13 los

## 2018-01-26 ENCOUNTER — Encounter (HOSPITAL_COMMUNITY): Payer: Self-pay

## 2018-01-26 ENCOUNTER — Ambulatory Visit (HOSPITAL_COMMUNITY)
Admission: RE | Admit: 2018-01-26 | Discharge: 2018-01-26 | Disposition: A | Discharge status: Home / Self Care | Payer: Medicare Other | Source: Ambulatory Visit | Attending: Internal Medicine | Admitting: Internal Medicine

## 2018-01-26 DIAGNOSIS — C3491 Malignant neoplasm of unspecified part of right bronchus or lung: Secondary | ICD-10-CM | POA: Diagnosis not present

## 2018-01-26 DIAGNOSIS — C3490 Malignant neoplasm of unspecified part of unspecified bronchus or lung: Secondary | ICD-10-CM | POA: Diagnosis present

## 2018-01-26 DIAGNOSIS — Z08 Encounter for follow-up examination after completed treatment for malignant neoplasm: Secondary | ICD-10-CM | POA: Insufficient documentation

## 2018-01-26 DIAGNOSIS — J439 Emphysema, unspecified: Secondary | ICD-10-CM | POA: Insufficient documentation

## 2018-01-26 DIAGNOSIS — Z85118 Personal history of other malignant neoplasm of bronchus and lung: Secondary | ICD-10-CM | POA: Insufficient documentation

## 2018-01-26 DIAGNOSIS — I700 Atherosclerosis of aorta: Secondary | ICD-10-CM | POA: Diagnosis not present

## 2018-01-26 MED ORDER — IOPAMIDOL (ISOVUE-300) INJECTION 61%
100.0000 mL | Freq: Once | INTRAVENOUS | Status: AC | PRN
  Administered 2018-01-26: 100 mL via INTRAVENOUS

## 2018-01-26 MED ORDER — IOPAMIDOL (ISOVUE-300) INJECTION 61%
INTRAVENOUS | Status: AC
  Filled 2018-01-26: qty 100

## 2018-01-29 ENCOUNTER — Inpatient Hospital Stay: Payer: Medicare Other | Attending: Internal Medicine | Admitting: Internal Medicine

## 2018-01-29 ENCOUNTER — Telehealth: Payer: Self-pay | Admitting: Internal Medicine

## 2018-01-29 ENCOUNTER — Inpatient Hospital Stay: Payer: Medicare Other

## 2018-01-29 ENCOUNTER — Encounter: Payer: Self-pay | Admitting: Internal Medicine

## 2018-01-29 VITALS — BP 130/55 | HR 102 | Temp 98.4°F | Resp 18 | Ht 64.0 in | Wt 181.4 lb

## 2018-01-29 DIAGNOSIS — Z79899 Other long term (current) drug therapy: Secondary | ICD-10-CM

## 2018-01-29 DIAGNOSIS — Z5111 Encounter for antineoplastic chemotherapy: Secondary | ICD-10-CM | POA: Diagnosis not present

## 2018-01-29 DIAGNOSIS — C3411 Malignant neoplasm of upper lobe, right bronchus or lung: Secondary | ICD-10-CM

## 2018-01-29 DIAGNOSIS — C7931 Secondary malignant neoplasm of brain: Secondary | ICD-10-CM | POA: Diagnosis not present

## 2018-01-29 DIAGNOSIS — C7949 Secondary malignant neoplasm of other parts of nervous system: Secondary | ICD-10-CM

## 2018-01-29 DIAGNOSIS — T451X5A Adverse effect of antineoplastic and immunosuppressive drugs, initial encounter: Secondary | ICD-10-CM

## 2018-01-29 DIAGNOSIS — D6481 Anemia due to antineoplastic chemotherapy: Secondary | ICD-10-CM

## 2018-01-29 DIAGNOSIS — F419 Anxiety disorder, unspecified: Secondary | ICD-10-CM

## 2018-01-29 LAB — CBC WITH DIFFERENTIAL/PLATELET
Basophils Absolute: 0 10*3/uL (ref 0.0–0.1)
Basophils Relative: 1 %
Eosinophils Absolute: 0 10*3/uL (ref 0.0–0.5)
Eosinophils Relative: 0 %
HCT: 35.4 % (ref 34.8–46.6)
Hemoglobin: 12 g/dL (ref 11.6–15.9)
Lymphocytes Relative: 4 %
Lymphs Abs: 0.3 10*3/uL — ABNORMAL LOW (ref 0.9–3.3)
MCH: 32.6 pg (ref 25.1–34.0)
MCHC: 33.8 g/dL (ref 31.5–36.0)
MCV: 96.4 fL (ref 79.5–101.0)
Monocytes Absolute: 0.2 10*3/uL (ref 0.1–0.9)
Monocytes Relative: 3 %
Neutro Abs: 6.5 10*3/uL (ref 1.5–6.5)
Neutrophils Relative %: 92 %
Platelets: 295 10*3/uL (ref 145–400)
RBC: 3.67 MIL/uL — ABNORMAL LOW (ref 3.70–5.45)
RDW: 15.5 % — ABNORMAL HIGH (ref 11.2–14.5)
WBC: 7 10*3/uL (ref 3.9–10.3)

## 2018-01-29 LAB — COMPREHENSIVE METABOLIC PANEL
ALT: 11 U/L (ref 0–55)
AST: 12 U/L (ref 5–34)
Albumin: 3.5 g/dL (ref 3.5–5.0)
Alkaline Phosphatase: 129 U/L (ref 40–150)
Anion gap: 12 — ABNORMAL HIGH (ref 3–11)
BUN: 12 mg/dL (ref 7–26)
CO2: 22 mmol/L (ref 22–29)
Calcium: 9.6 mg/dL (ref 8.4–10.4)
Chloride: 106 mmol/L (ref 98–109)
Creatinine, Ser: 0.83 mg/dL (ref 0.60–1.10)
GFR calc Af Amer: >60 mL/min (ref >60)
GFR calc non Af Amer: >60 mL/min (ref >60)
Glucose, Bld: 200 mg/dL — ABNORMAL HIGH (ref 70–140)
Potassium: 4.4 mmol/L (ref 3.5–5.1)
Sodium: 140 mmol/L (ref 136–145)
Total Bilirubin: 0.4 mg/dL (ref 0.2–1.2)
Total Protein: 7.1 g/dL (ref 6.4–8.3)

## 2018-01-29 MED ORDER — DEXAMETHASONE SODIUM PHOSPHATE 10 MG/ML IJ SOLN
10.0000 mg | Freq: Once | INTRAMUSCULAR | Status: AC
  Administered 2018-01-29: 10 mg via INTRAVENOUS

## 2018-01-29 MED ORDER — ONDANSETRON HCL 8 MG PO TABS
ORAL_TABLET | ORAL | Status: AC
  Filled 2018-01-29: qty 1

## 2018-01-29 MED ORDER — SODIUM CHLORIDE 0.9 % IV SOLN
500.0000 mg/m2 | Freq: Once | INTRAVENOUS | Status: AC
  Administered 2018-01-29: 900 mg via INTRAVENOUS
  Filled 2018-01-29: qty 20

## 2018-01-29 MED ORDER — DEXAMETHASONE SODIUM PHOSPHATE 10 MG/ML IJ SOLN
INTRAMUSCULAR | Status: AC
  Filled 2018-01-29: qty 1

## 2018-01-29 MED ORDER — ONDANSETRON HCL 8 MG PO TABS
8.0000 mg | ORAL_TABLET | Freq: Once | ORAL | Status: AC
  Administered 2018-01-29: 8 mg via ORAL

## 2018-01-29 MED ORDER — SODIUM CHLORIDE 0.9 % IV SOLN
Freq: Once | INTRAVENOUS | Status: AC
  Administered 2018-01-29: 10:00:00 via INTRAVENOUS

## 2018-01-29 NOTE — Patient Instructions (Signed)
[facility] Discharge Instructions for Patients Receiving Chemotherapy  Today you received the following chemotherapy agents Alimta  To help prevent nausea and vomiting after your treatment, we encourage you to take your nausea medication as directed   If you develop nausea and vomiting that is not controlled by your nausea medication, call the clinic.   BELOW ARE SYMPTOMS THAT SHOULD BE REPORTED IMMEDIATELY:  *FEVER GREATER THAN 100.5 F  *CHILLS WITH OR WITHOUT FEVER  NAUSEA AND VOMITING THAT IS NOT CONTROLLED WITH YOUR NAUSEA MEDICATION  *UNUSUAL SHORTNESS OF BREATH  *UNUSUAL BRUISING OR BLEEDING  TENDERNESS IN MOUTH AND THROAT WITH OR WITHOUT PRESENCE OF ULCERS  *URINARY PROBLEMS  *BOWEL PROBLEMS  UNUSUAL RASH Items with * indicate a potential emergency and should be followed up as soon as possible.  Feel free to call the clinic should you have any questions or concerns. The clinic phone number is [phone].  Please show the [facility] at check-in to the Emergency Department and triage nurse.

## 2018-01-29 NOTE — Telephone Encounter (Signed)
Next three cycles already scheduled per 6/3 los.

## 2018-01-29 NOTE — Progress Notes (Signed)
[place] [phone]   [phone]  OFFICE PROGRESS NOTE  [name], MD [address] [place] [address]  DIAGNOSIS: Stage IV (T2a, N0, M1b) non-small cell lung cancer, adenocarcinoma with negative EGFR and ALK mutations diagnosed in January 2015 and presented with right upper lobe lung mass in addition to a solitary brain metastasis.  PRIOR THERAPY: 1) Status post stereotactic radiotherapy to a solitary right parietal brain lesion under the care of Dr. [name] on 10/16/2013. 2) Status post palliative radiotherapy to the right lung tumor under the care of Dr. [name] completed on 12/05/2013. 3) Systemic chemotherapy with carboplatin for AUC of 5 and Alimta 500 mg/M2 every 3 weeks. First dose Jan 06 2014. Status post 6 cycles.  CURRENT THERAPY: Systemic chemotherapy with maintenance Alimta 500 MG/M2 every 3 weeks, status post 63 cycles.  INTERVAL HISTORY: Alexis [name] 67 y.o. female returns to the clinic today for follow-up visit accompanied by her husband.  The patient is doing fine today with no concerning complaints.  She denied having any chest pain, shortness of breath, cough or hemoptysis.  She denied having any recent weight loss or night sweats.  She has no nausea, vomiting, diarrhea or constipation.  She has no recent fever or chills.  She continues to tolerate her maintenance treatment with Alimta fairly well.  She is here for evaluation with repeat CT scan of the chest, abdomen and pelvis before starting cycle #64.   MEDICAL HISTORY: Past Medical History:  Diagnosis Date  . Anxiety   . Anxiety 06/20/2016  . Cervical cancer ([facility])   . Encounter for antineoplastic chemotherapy 07/20/2015  . 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  MEDICATIONS:  Current Outpatient Medications  Medication Sig Dispense Refill  .  acetaminophen (TYLENOL) 500 MG tablet Take 500 mg by mouth every 6 (six) hours as needed for mild pain or headache. Reported on 11/02/2015    . ALPRAZolam (XANAX) 0.25 MG tablet Take 1 tablet (0.25 mg total) by mouth 3 (three) times daily as needed. for anxiety 45 tablet 0  . Ascorbic Acid (VITAMIN C GUMMIE PO) Take 1 each by mouth every morning.    Marland Kitchen dexamethasone (DECADRON) 4 MG tablet TAKE 1 TABLET BY MOUTH TWICE A DAY THE DAY BEFORE, DAY OF AND DAY AFTER THE CHEMOTHERAPY EVERY 3 WKS 40 tablet 1  . folic acid (FOLVITE) 1 MG tablet TAKE 1 TABLET BY MOUTH EVERY DAY 90 tablet 0  . Multiple Vitamin (MULTIVITAMIN WITH MINERALS) TABS tablet Take 1 tablet by mouth every morning.    Marland Kitchen omeprazole (PRILOSEC) 20 MG capsule Take 1 capsule (20 mg total) by mouth daily. As needed for reflux or indigestion. 42 capsule PRN  . OVER THE COUNTER MEDICATION Take 1 tablet by mouth every morning. (Vitamin A)    . pentoxifylline (TRENTAL) 400 MG CR tablet TAKE 1 TABLET BY MOUTH TWICE A DAY (TAKE WITH VITAMIN E 400MG TWICE A DAY)  3  . senna-docusate (SENOKOT-S) 8.6-50 MG tablet Take 1 tablet by mouth daily. 30 tablet prn  . benzonatate (TESSALON) 100 MG capsule Take 1 capsule (100 mg total) by mouth every 8 (eight) hours. (Patient not taking: Reported on 01/01/2018) 21 capsule 0  . ondansetron (ZOFRAN) 8 MG tablet Take 1 tablet (8 mg total) by mouth every 8 (eight)  hours as needed for nausea or vomiting. (Patient not taking: Reported on 01/01/2018) 30 tablet 0  . prochlorperazine (COMPAZINE) 10 MG tablet Take 1 tablet (10 mg total) by mouth every 6 (six) hours as needed for nausea or vomiting. (Patient not taking: Reported on 01/01/2018) 60 tablet 0   No current facility-administered medications for this visit.     SURGICAL HISTORY:  Past Surgical History:  Procedure Laterality Date  . ABDOMINAL HYSTERECTOMY    . COLOSTOMY TAKEDOWN N/A 07/10/2014   Procedure: LAPAROSCOPIC LYSIS OF ADHESIONS (90 MIN) LAPAROSCOPIC ASSISTED  COLOSTOMY CLOSURE, RIGID PROCTOSIGMOIDOSCOPY;  Surgeon: Jackolyn Confer, MD;  Location: [facility];  Service: General;  Laterality: N/A;  . LAPAROTOMY N/A 11/03/2013   Procedure: EXPLORATORY LAPAROTOMY, DRAINAGE OF INTRA  ABDOMINAL ABSCESSES, MOBILIZATION OF SPLENIC FLEXURE, SIGMOID COLECTOMY WITH COLOSTOMY;  Surgeon: Odis Hollingshead, MD;  Location: [facility];  Service: General;  Laterality: N/A;  . VIDEO BRONCHOSCOPY Bilateral 08/30/2013   Procedure: VIDEO BRONCHOSCOPY WITH FLUORO;  Surgeon: Tanda Rockers, MD;  Location: [facility];  Service: Cardiopulmonary;  Laterality: Bilateral;    REVIEW OF SYSTEMS:  Constitutional: negative Eyes: negative Ears, nose, mouth, throat, and face: negative Respiratory: negative Cardiovascular: negative Gastrointestinal: negative Genitourinary:negative Integument/breast: negative Hematologic/lymphatic: negative Musculoskeletal:negative Neurological: negative Behavioral/Psych: negative Endocrine: negative Allergic/Immunologic: negative   PHYSICAL EXAMINATION: General appearance: alert, cooperative and no distress Head: Normocephalic, without obvious abnormality, atraumatic Neck: no adenopathy, no JVD, supple, symmetrical, trachea midline and thyroid not enlarged, symmetric, no tenderness/mass/nodules Lymph nodes: Cervical, supraclavicular, and axillary nodes normal. Resp: clear to auscultation bilaterally Back: symmetric, no curvature. ROM normal. No CVA tenderness. Cardio: regular rate and rhythm, S1, S2 normal, no murmur, click, rub or gallop GI: soft, non-tender; bowel sounds normal; no masses,  no organomegaly Extremities: extremities normal, atraumatic, no cyanosis or edema Neurologic: Alert and oriented X 3, normal strength and tone. Normal symmetric reflexes. Normal coordination and gait  ECOG PERFORMANCE STATUS: 0 - Asymptomatic  Blood pressure (!) 130/55, pulse (!) 102, temperature 98.4 F (36.9 C), temperature source Oral, resp. rate 18, height  '5\' 4"'$  (1.626 m), weight 181 lb 6.4 oz (82.3 kg), SpO2 100 %.  LABORATORY DATA: Lab Results  Component Value Date   WBC 7.0 01/29/2018   HGB 12.0 01/29/2018   HCT 35.4 01/29/2018   MCV 96.4 01/29/2018   PLT 295 01/29/2018      Chemistry      Component Value Date/Time   NA 140 01/29/2018 0834   NA 139 08/14/2017 0837   K 4.4 01/29/2018 0834   K 4.4 08/14/2017 0837   CL 106 01/29/2018 0834   CO2 22 01/29/2018 0834   CO2 24 08/14/2017 0837   BUN 12 01/29/2018 0834   BUN 13.3 08/14/2017 0837   CREATININE 0.83 01/29/2018 0834   CREATININE 0.8 08/14/2017 0837      Component Value Date/Time   CALCIUM 9.6 01/29/2018 0834   CALCIUM 9.3 08/14/2017 0837   ALKPHOS 129 01/29/2018 0834   ALKPHOS 107 08/14/2017 0837   AST 12 01/29/2018 0834   AST 12 08/14/2017 0837   ALT 11 01/29/2018 0834   ALT 12 08/14/2017 0837   BILITOT 0.4 01/29/2018 0834   BILITOT 0.37 08/14/2017 0837       RADIOGRAPHIC STUDIES: Ct Chest W Contrast  Result Date: 01/26/2018 CLINICAL DATA:  Patient with history of right lung cancer diagnosed in 2014. Follow-up exam. EXAM: CT CHEST, ABDOMEN, AND PELVIS WITH CONTRAST TECHNIQUE: Multidetector CT imaging of the chest, abdomen and  pelvis was performed following the standard protocol during bolus administration of intravenous contrast. CONTRAST:  125m ISOVUE-300 IOPAMIDOL (ISOVUE-300) INJECTION 61% COMPARISON:  CT CAP 11/24/2017. FINDINGS: CT CHEST FINDINGS Cardiovascular: Normal heart size. Thoracic aortic vascular calcifications. No pericardial effusion. Mediastinum/Nodes: No enlarged axillary, mediastinal or hilar lymphadenopathy. Normal appearance of the esophagus. Lungs/Pleura: Centrilobular and paraseptal emphysematous change. Similar appearing 3.7 x 3.2 cm right apical mass (image 34; series 4). Left apical scarring unchanged. Centrilobular and paraseptal emphysematous change. Interval resolution of previously described ground-glass nodule left upper lobe. No new  or enlarging pulmonary nodules. No pleural effusion or pneumothorax. Musculoskeletal: Thoracic spine degenerative changes. No aggressive or acute osseous lesions. CT ABDOMEN PELVIS FINDINGS Hepatobiliary: Liver is normal in size and contour. No focal lesion identified. Gallbladder is unremarkable. No intrahepatic or extrahepatic biliary ductal dilatation. Pancreas: Unremarkable Spleen: Unremarkable Adrenals/Urinary Tract: Normal adrenal glands. Kidneys enhance symmetrically with contrast. No hydronephrosis. Urinary bladder is unremarkable. Stomach/Bowel: Oral contrast material throughout the small large bowel. No abnormal bowel wall thickening or evidence for bowel obstruction. No free fluid or free intraperitoneal air. Normal morphology of the stomach. Normal appendix. Re demonstrated surgical suture line within the distal sigmoid colon. Vascular/Lymphatic: Normal caliber abdominal aorta. No retroperitoneal lymphadenopathy. Reproductive: Status post hysterectomy. Adnexal structures unremarkable. Other: None. Musculoskeletal: Lumbar spine degenerative changes. No aggressive or acute appearing osseous lesions. IMPRESSION: 1. Unchanged right apical mass. 2. No evidence for new or progressive metastatic disease in the chest, abdomen or pelvis. 3. Aortic Atherosclerosis (ICD10-I70.0) and Emphysema (ICD10-J43.9). Electronically Signed   By: DLovey NewcomerM.D.   On: 01/26/2018 14:20   Ct Abdomen Pelvis W Contrast  Result Date: 01/26/2018 CLINICAL DATA:  Patient with history of right lung cancer diagnosed in 2014. Follow-up exam. EXAM: CT CHEST, ABDOMEN, AND PELVIS WITH CONTRAST TECHNIQUE: Multidetector CT imaging of the chest, abdomen and pelvis was performed following the standard protocol during bolus administration of intravenous contrast. CONTRAST:  1055mISOVUE-300 IOPAMIDOL (ISOVUE-300) INJECTION 61% COMPARISON:  CT CAP 11/24/2017. FINDINGS: CT CHEST FINDINGS Cardiovascular: Normal heart size. Thoracic aortic  vascular calcifications. No pericardial effusion. Mediastinum/Nodes: No enlarged axillary, mediastinal or hilar lymphadenopathy. Normal appearance of the esophagus. Lungs/Pleura: Centrilobular and paraseptal emphysematous change. Similar appearing 3.7 x 3.2 cm right apical mass (image 34; series 4). Left apical scarring unchanged. Centrilobular and paraseptal emphysematous change. Interval resolution of previously described ground-glass nodule left upper lobe. No new or enlarging pulmonary nodules. No pleural effusion or pneumothorax. Musculoskeletal: Thoracic spine degenerative changes. No aggressive or acute osseous lesions. CT ABDOMEN PELVIS FINDINGS Hepatobiliary: Liver is normal in size and contour. No focal lesion identified. Gallbladder is unremarkable. No intrahepatic or extrahepatic biliary ductal dilatation. Pancreas: Unremarkable Spleen: Unremarkable Adrenals/Urinary Tract: Normal adrenal glands. Kidneys enhance symmetrically with contrast. No hydronephrosis. Urinary bladder is unremarkable. Stomach/Bowel: Oral contrast material throughout the small large bowel. No abnormal bowel wall thickening or evidence for bowel obstruction. No free fluid or free intraperitoneal air. Normal morphology of the stomach. Normal appendix. Re demonstrated surgical suture line within the distal sigmoid colon. Vascular/Lymphatic: Normal caliber abdominal aorta. No retroperitoneal lymphadenopathy. Reproductive: Status post hysterectomy. Adnexal structures unremarkable. Other: None. Musculoskeletal: Lumbar spine degenerative changes. No aggressive or acute appearing osseous lesions. IMPRESSION: 1. Unchanged right apical mass. 2. No evidence for new or progressive metastatic disease in the chest, abdomen or pelvis. 3. Aortic Atherosclerosis (ICD10-I70.0) and Emphysema (ICD10-J43.9). Electronically Signed   By: DrLovey Newcomer.D.   On: 01/26/2018 14:20    ASSESSMENT AND PLAN:  This is a very pleasant 67 years old white female  with metastatic non-small cell lung cancer, adenocarcinoma status post induction systemic chemotherapy with carboplatin and Alimta with partial response. The patient continues to tolerate her treatment fairly well with no concerning complaints. Repeat CT scan of the chest, abdomen and pelvis performed recently showed no concerning findings for disease progression.  I discussed the scan results with the patient and her husband.  I recommended for her to continue her maintenance treatment with Alimta and she will proceed with cycle #64 today. The patient will come back for follow-up visit in 3 weeks for evaluation before the next cycle of her treatment. She was advised to call immediately if she has any concerning symptoms in the interval. The patient voices understanding of current disease status and treatment options and is in agreement with the current care plan. All questions were answered. The patient knows to call the clinic with any problems, questions or concerns. We can certainly see the patient much sooner if necessary.  Disclaimer: This note was dictated with voice recognition software. Similar sounding words can inadvertently be transcribed and may not be corrected upon review.

## 2018-01-30 ENCOUNTER — Other Ambulatory Visit: Payer: Self-pay | Admitting: Internal Medicine

## 2018-01-30 DIAGNOSIS — C3490 Malignant neoplasm of unspecified part of unspecified bronchus or lung: Secondary | ICD-10-CM

## 2018-02-01 ENCOUNTER — Other Ambulatory Visit: Payer: Self-pay | Admitting: Radiation Therapy

## 2018-02-01 DIAGNOSIS — C7949 Secondary malignant neoplasm of other parts of nervous system: Principal | ICD-10-CM

## 2018-02-01 DIAGNOSIS — C7931 Secondary malignant neoplasm of brain: Secondary | ICD-10-CM

## 2018-02-19 ENCOUNTER — Inpatient Hospital Stay: Payer: Medicare Other

## 2018-02-19 ENCOUNTER — Telehealth: Payer: Self-pay | Admitting: Internal Medicine

## 2018-02-19 ENCOUNTER — Encounter: Payer: Self-pay | Admitting: Internal Medicine

## 2018-02-19 ENCOUNTER — Inpatient Hospital Stay (HOSPITAL_BASED_OUTPATIENT_CLINIC_OR_DEPARTMENT_OTHER): Payer: Medicare Other | Admitting: Internal Medicine

## 2018-02-19 VITALS — BP 122/71 | HR 96 | Temp 98.0°F | Resp 18 | Ht 64.0 in | Wt 181.0 lb

## 2018-02-19 DIAGNOSIS — C7931 Secondary malignant neoplasm of brain: Secondary | ICD-10-CM | POA: Diagnosis not present

## 2018-02-19 DIAGNOSIS — Z5111 Encounter for antineoplastic chemotherapy: Secondary | ICD-10-CM

## 2018-02-19 DIAGNOSIS — C3411 Malignant neoplasm of upper lobe, right bronchus or lung: Secondary | ICD-10-CM

## 2018-02-19 DIAGNOSIS — C7949 Secondary malignant neoplasm of other parts of nervous system: Secondary | ICD-10-CM

## 2018-02-19 DIAGNOSIS — Z79899 Other long term (current) drug therapy: Secondary | ICD-10-CM | POA: Diagnosis not present

## 2018-02-19 DIAGNOSIS — F411 Generalized anxiety disorder: Secondary | ICD-10-CM

## 2018-02-19 LAB — CBC WITH DIFFERENTIAL/PLATELET
Basophils Absolute: 0 10*3/uL (ref 0.0–0.1)
Basophils Relative: 0 %
Eosinophils Absolute: 0 10*3/uL (ref 0.0–0.5)
Eosinophils Relative: 0 %
HCT: 37.1 % (ref 34.8–46.6)
Hemoglobin: 11.6 g/dL (ref 11.6–15.9)
Lymphocytes Relative: 3 %
Lymphs Abs: 0.3 10*3/uL — ABNORMAL LOW (ref 0.9–3.3)
MCH: 31.4 pg (ref 25.1–34.0)
MCHC: 31.3 g/dL — ABNORMAL LOW (ref 31.5–36.0)
MCV: 100.5 fL (ref 79.5–101.0)
Monocytes Absolute: 0.3 10*3/uL (ref 0.1–0.9)
Monocytes Relative: 4 %
Neutro Abs: 8 10*3/uL — ABNORMAL HIGH (ref 1.5–6.5)
Neutrophils Relative %: 93 %
Platelets: 335 10*3/uL (ref 145–400)
RBC: 3.69 MIL/uL — ABNORMAL LOW (ref 3.70–5.45)
RDW: 15.9 % — ABNORMAL HIGH (ref 11.2–14.5)
WBC: 8.6 10*3/uL (ref 3.9–10.3)

## 2018-02-19 LAB — COMPREHENSIVE METABOLIC PANEL
ALT: 9 U/L (ref 0–55)
AST: 12 U/L (ref 5–34)
Albumin: 3.6 g/dL (ref 3.5–5.0)
Alkaline Phosphatase: 113 U/L (ref 40–150)
Anion gap: 10 (ref 3–11)
BUN: 15 mg/dL (ref 7–26)
CO2: 24 mmol/L (ref 22–29)
Calcium: 9.6 mg/dL (ref 8.4–10.4)
Chloride: 106 mmol/L (ref 98–109)
Creatinine, Ser: 0.79 mg/dL (ref 0.60–1.10)
GFR calc Af Amer: >60 mL/min (ref >60)
GFR calc non Af Amer: >60 mL/min (ref >60)
Glucose, Bld: 152 mg/dL — ABNORMAL HIGH (ref 70–140)
Potassium: 4.2 mmol/L (ref 3.5–5.1)
Sodium: 140 mmol/L (ref 136–145)
Total Bilirubin: 0.4 mg/dL (ref 0.2–1.2)
Total Protein: 6.9 g/dL (ref 6.4–8.3)

## 2018-02-19 MED ORDER — ONDANSETRON HCL 8 MG PO TABS
8.0000 mg | ORAL_TABLET | Freq: Once | ORAL | Status: AC
  Administered 2018-02-19: 8 mg via ORAL

## 2018-02-19 MED ORDER — ONDANSETRON HCL 8 MG PO TABS
ORAL_TABLET | ORAL | Status: AC
Start: 2018-02-19 — End: ?
  Filled 2018-02-19: qty 1

## 2018-02-19 MED ORDER — SODIUM CHLORIDE 0.9 % IV SOLN
500.0000 mg/m2 | Freq: Once | INTRAVENOUS | Status: AC
  Administered 2018-02-19: 900 mg via INTRAVENOUS
  Filled 2018-02-19: qty 20

## 2018-02-19 MED ORDER — CYANOCOBALAMIN 1000 MCG/ML IJ SOLN
1000.0000 ug | Freq: Once | INTRAMUSCULAR | Status: AC
  Administered 2018-02-19: 1000 ug via INTRAMUSCULAR

## 2018-02-19 MED ORDER — DEXAMETHASONE SODIUM PHOSPHATE 10 MG/ML IJ SOLN
10.0000 mg | Freq: Once | INTRAMUSCULAR | Status: AC
  Administered 2018-02-19: 10 mg via INTRAVENOUS

## 2018-02-19 MED ORDER — CYANOCOBALAMIN 1000 MCG/ML IJ SOLN
INTRAMUSCULAR | Status: AC
  Filled 2018-02-19: qty 1

## 2018-02-19 MED ORDER — ALPRAZOLAM 0.25 MG PO TABS: 0.2500 mg | ORAL_TABLET | Freq: Three times a day (TID) | ORAL | 0 refills | Status: DC | PRN

## 2018-02-19 MED ORDER — DEXAMETHASONE SODIUM PHOSPHATE 10 MG/ML IJ SOLN
INTRAMUSCULAR | Status: AC
  Filled 2018-02-19: qty 1

## 2018-02-19 MED ORDER — SODIUM CHLORIDE 0.9 % IV SOLN
Freq: Once | INTRAVENOUS | Status: AC
  Administered 2018-02-19: 10:00:00 via INTRAVENOUS

## 2018-02-19 NOTE — Progress Notes (Signed)
[place] [phone]   [phone]  OFFICE PROGRESS NOTE  Alexis Roers, MD [address] [place] [address]  DIAGNOSIS: Stage IV (T2a, N0, M1b) non-small cell lung cancer, adenocarcinoma with negative EGFR and ALK mutations diagnosed in January 2015 and presented with right upper lobe lung mass in addition to a solitary brain metastasis.  PRIOR THERAPY: 1) Status post stereotactic radiotherapy to a solitary right parietal brain lesion under the care of Dr. [name] on 10/16/2013. 2) Status post palliative radiotherapy to the right lung tumor under the care of Dr. [name] completed on 12/05/2013. 3) Systemic chemotherapy with carboplatin for AUC of 5 and Alimta 500 mg/M2 every 3 weeks. First dose Jan 06 2014. Status post 6 cycles.  CURRENT THERAPY: Systemic chemotherapy with maintenance Alimta 500 MG/M2 every 3 weeks, status post 64 cycles.  INTERVAL HISTORY: Alexis [name] 67 y.o. female returns to the clinic today for follow-up visit and accompanied by her husband.  The patient is feeling fine today with no specific complaints.  She denied having any chest pain, shortness of breath, cough or hemoptysis.  She denied having any fever or chills.  She has no nausea, vomiting, diarrhea or constipation.  She denied having any weight loss or night sweats.  She continues to tolerate her treatment with Alimta fairly well.  She is here today for evaluation before starting cycle #65.    MEDICAL HISTORY: Past Medical History:  Diagnosis Date  . Anxiety   . Anxiety 06/20/2016  . Cervical cancer ([place])   . Encounter for antineoplastic chemotherapy 07/20/2015  . 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  MEDICATIONS:  Current Outpatient Medications  Medication Sig Dispense Refill  . acetaminophen (TYLENOL) 500 MG tablet Take 500 mg by mouth  every 6 (six) hours as needed for mild pain or headache. Reported on 11/02/2015    . ALPRAZolam (XANAX) 0.25 MG tablet Take 1 tablet (0.25 mg total) by mouth 3 (three) times daily as needed. for anxiety 45 tablet 0  . Ascorbic Acid (VITAMIN C GUMMIE PO) Take 1 each by mouth every morning.    . benzonatate (TESSALON) 100 MG capsule Take 1 capsule (100 mg total) by mouth every 8 (eight) hours. (Patient not taking: Reported on 01/01/2018) 21 capsule 0  . dexamethasone (DECADRON) 4 MG tablet TAKE 1 TABLET BY MOUTH TWICE A DAY THE DAY BEFORE, DAY OF AND DAY AFTER THE CHEMOTHERAPY EVERY 3 WKS 40 tablet 1  . folic acid (FOLVITE) 1 MG tablet TAKE 1 TABLET BY MOUTH EVERY DAY 90 tablet 0  . Multiple Vitamin (MULTIVITAMIN WITH MINERALS) TABS tablet Take 1 tablet by mouth every morning.    Marland Kitchen omeprazole (PRILOSEC) 20 MG capsule Take 1 capsule (20 mg total) by mouth daily. As needed for reflux or indigestion. 42 capsule PRN  . ondansetron (ZOFRAN) 8 MG tablet Take 1 tablet (8 mg total) by mouth every 8 (eight) hours as needed for nausea or vomiting. (Patient not taking: Reported on 01/01/2018) 30 tablet 0  . OVER THE COUNTER MEDICATION Take 1 tablet by mouth every morning. (Vitamin A)    . pentoxifylline (TRENTAL) 400 MG CR tablet TAKE 1 TABLET BY MOUTH TWICE A DAY (TAKE WITH VITAMIN E 400MG TWICE A DAY)  3  . prochlorperazine (COMPAZINE) 10 MG tablet Take 1  tablet (10 mg total) by mouth every 6 (six) hours as needed for nausea or vomiting. (Patient not taking: Reported on 01/01/2018) 60 tablet 0  . senna-docusate (SENOKOT-S) 8.6-50 MG tablet Take 1 tablet by mouth daily. 30 tablet prn   No current facility-administered medications for this visit.     SURGICAL HISTORY:  Past Surgical History:  Procedure Laterality Date  . ABDOMINAL HYSTERECTOMY    . COLOSTOMY TAKEDOWN N/A 07/10/2014   Procedure: LAPAROSCOPIC LYSIS OF ADHESIONS (90 MIN) LAPAROSCOPIC ASSISTED COLOSTOMY CLOSURE, RIGID PROCTOSIGMOIDOSCOPY;  Surgeon:  Jackolyn Confer, MD;  Location: [facility];  Service: General;  Laterality: N/A;  . LAPAROTOMY N/A 11/03/2013   Procedure: EXPLORATORY LAPAROTOMY, DRAINAGE OF INTRA  ABDOMINAL ABSCESSES, MOBILIZATION OF SPLENIC FLEXURE, SIGMOID COLECTOMY WITH COLOSTOMY;  Surgeon: Odis Hollingshead, MD;  Location: [facility];  Service: General;  Laterality: N/A;  . VIDEO BRONCHOSCOPY Bilateral 08/30/2013   Procedure: VIDEO BRONCHOSCOPY WITH FLUORO;  Surgeon: Tanda Rockers, MD;  Location: [facility];  Service: Cardiopulmonary;  Laterality: Bilateral;    REVIEW OF SYSTEMS:  A comprehensive review of systems was negative.   PHYSICAL EXAMINATION: General appearance: alert, cooperative and no distress Head: Normocephalic, without obvious abnormality, atraumatic Neck: no adenopathy, no JVD, supple, symmetrical, trachea midline and thyroid not enlarged, symmetric, no tenderness/mass/nodules Lymph nodes: Cervical, supraclavicular, and axillary nodes normal. Resp: clear to auscultation bilaterally Back: symmetric, no curvature. ROM normal. No CVA tenderness. Cardio: regular rate and rhythm, S1, S2 normal, no murmur, click, rub or gallop GI: soft, non-tender; bowel sounds normal; no masses,  no organomegaly Extremities: extremities normal, atraumatic, no cyanosis or edema  ECOG PERFORMANCE STATUS: 0 - Asymptomatic  Blood pressure 122/71, pulse 96, temperature 98 F (36.7 C), temperature source Oral, resp. rate 18, height 5' 4"  (1.626 m), weight 181 lb (82.1 kg), SpO2 97 %.  LABORATORY DATA: Lab Results  Component Value Date   WBC 8.6 02/19/2018   HGB 11.6 02/19/2018   HCT 37.1 02/19/2018   MCV 100.5 02/19/2018   PLT 335 02/19/2018      Chemistry      Component Value Date/Time   NA 140 01/29/2018 0834   NA 139 08/14/2017 0837   K 4.4 01/29/2018 0834   K 4.4 08/14/2017 0837   CL 106 01/29/2018 0834   CO2 22 01/29/2018 0834   CO2 24 08/14/2017 0837   BUN 12 01/29/2018 0834   BUN 13.3 08/14/2017 0837    CREATININE 0.83 01/29/2018 0834   CREATININE 0.8 08/14/2017 0837      Component Value Date/Time   CALCIUM 9.6 01/29/2018 0834   CALCIUM 9.3 08/14/2017 0837   ALKPHOS 129 01/29/2018 0834   ALKPHOS 107 08/14/2017 0837   AST 12 01/29/2018 0834   AST 12 08/14/2017 0837   ALT 11 01/29/2018 0834   ALT 12 08/14/2017 0837   BILITOT 0.4 01/29/2018 0834   BILITOT 0.37 08/14/2017 0837       RADIOGRAPHIC STUDIES: Ct Chest W Contrast  Result Date: 01/26/2018 CLINICAL DATA:  Patient with history of right lung cancer diagnosed in 2014. Follow-up exam. EXAM: CT CHEST, ABDOMEN, AND PELVIS WITH CONTRAST TECHNIQUE: Multidetector CT imaging of the chest, abdomen and pelvis was performed following the standard protocol during bolus administration of intravenous contrast. CONTRAST:  154m ISOVUE-300 IOPAMIDOL (ISOVUE-300) INJECTION 61% COMPARISON:  CT CAP 11/24/2017. FINDINGS: CT CHEST FINDINGS Cardiovascular: Normal heart size. Thoracic aortic vascular calcifications. No pericardial effusion. Mediastinum/Nodes: No enlarged axillary, mediastinal or hilar lymphadenopathy. Normal appearance of the esophagus.  Lungs/Pleura: Centrilobular and paraseptal emphysematous change. Similar appearing 3.7 x 3.2 cm right apical mass (image 34; series 4). Left apical scarring unchanged. Centrilobular and paraseptal emphysematous change. Interval resolution of previously described ground-glass nodule left upper lobe. No new or enlarging pulmonary nodules. No pleural effusion or pneumothorax. Musculoskeletal: Thoracic spine degenerative changes. No aggressive or acute osseous lesions. CT ABDOMEN PELVIS FINDINGS Hepatobiliary: Liver is normal in size and contour. No focal lesion identified. Gallbladder is unremarkable. No intrahepatic or extrahepatic biliary ductal dilatation. Pancreas: Unremarkable Spleen: Unremarkable Adrenals/Urinary Tract: Normal adrenal glands. Kidneys enhance symmetrically with contrast. No hydronephrosis.  Urinary bladder is unremarkable. Stomach/Bowel: Oral contrast material throughout the small large bowel. No abnormal bowel wall thickening or evidence for bowel obstruction. No free fluid or free intraperitoneal air. Normal morphology of the stomach. Normal appendix. Re demonstrated surgical suture line within the distal sigmoid colon. Vascular/Lymphatic: Normal caliber abdominal aorta. No retroperitoneal lymphadenopathy. Reproductive: Status post hysterectomy. Adnexal structures unremarkable. Other: None. Musculoskeletal: Lumbar spine degenerative changes. No aggressive or acute appearing osseous lesions. IMPRESSION: 1. Unchanged right apical mass. 2. No evidence for new or progressive metastatic disease in the chest, abdomen or pelvis. 3. Aortic Atherosclerosis (ICD10-I70.0) and Emphysema (ICD10-J43.9). Electronically Signed   By: Lovey Newcomer M.D.   On: 01/26/2018 14:20   Ct Abdomen Pelvis W Contrast  Result Date: 01/26/2018 CLINICAL DATA:  Patient with history of right lung cancer diagnosed in 2014. Follow-up exam. EXAM: CT CHEST, ABDOMEN, AND PELVIS WITH CONTRAST TECHNIQUE: Multidetector CT imaging of the chest, abdomen and pelvis was performed following the standard protocol during bolus administration of intravenous contrast. CONTRAST:  185m ISOVUE-300 IOPAMIDOL (ISOVUE-300) INJECTION 61% COMPARISON:  CT CAP 11/24/2017. FINDINGS: CT CHEST FINDINGS Cardiovascular: Normal heart size. Thoracic aortic vascular calcifications. No pericardial effusion. Mediastinum/Nodes: No enlarged axillary, mediastinal or hilar lymphadenopathy. Normal appearance of the esophagus. Lungs/Pleura: Centrilobular and paraseptal emphysematous change. Similar appearing 3.7 x 3.2 cm right apical mass (image 34; series 4). Left apical scarring unchanged. Centrilobular and paraseptal emphysematous change. Interval resolution of previously described ground-glass nodule left upper lobe. No new or enlarging pulmonary nodules. No pleural  effusion or pneumothorax. Musculoskeletal: Thoracic spine degenerative changes. No aggressive or acute osseous lesions. CT ABDOMEN PELVIS FINDINGS Hepatobiliary: Liver is normal in size and contour. No focal lesion identified. Gallbladder is unremarkable. No intrahepatic or extrahepatic biliary ductal dilatation. Pancreas: Unremarkable Spleen: Unremarkable Adrenals/Urinary Tract: Normal adrenal glands. Kidneys enhance symmetrically with contrast. No hydronephrosis. Urinary bladder is unremarkable. Stomach/Bowel: Oral contrast material throughout the small large bowel. No abnormal bowel wall thickening or evidence for bowel obstruction. No free fluid or free intraperitoneal air. Normal morphology of the stomach. Normal appendix. Re demonstrated surgical suture line within the distal sigmoid colon. Vascular/Lymphatic: Normal caliber abdominal aorta. No retroperitoneal lymphadenopathy. Reproductive: Status post hysterectomy. Adnexal structures unremarkable. Other: None. Musculoskeletal: Lumbar spine degenerative changes. No aggressive or acute appearing osseous lesions. IMPRESSION: 1. Unchanged right apical mass. 2. No evidence for new or progressive metastatic disease in the chest, abdomen or pelvis. 3. Aortic Atherosclerosis (ICD10-I70.0) and Emphysema (ICD10-J43.9). Electronically Signed   By: DLovey NewcomerM.D.   On: 01/26/2018 14:20    ASSESSMENT AND PLAN:  This is a very pleasant 67years old white female with metastatic non-small cell lung cancer, adenocarcinoma status post induction systemic chemotherapy with carboplatin and Alimta with partial response. The patient is currently on maintenance treatment with single agent Alimta status post 64 cycles and has been tolerating this treatment fairly well. I recommended  for the patient to proceed with cycle #65 today as scheduled. I will see her back for follow-up visit in 3 weeks for evaluation before the next cycle of her treatment. She was advised to call  immediately if she has any concerning symptoms in the interval. The patient voices understanding of current disease status and treatment options and is in agreement with the current care plan. All questions were answered. The patient knows to call the clinic with any problems, questions or concerns. We can certainly see the patient much sooner if necessary.  Disclaimer: This note was dictated with voice recognition software. Similar sounding words can inadvertently be transcribed and may not be corrected upon review.

## 2018-02-19 NOTE — Telephone Encounter (Signed)
Added lab/dr/chemo for 8/26.  Spoke w/ patient and she will stop for an updated calendar at her next visit.

## 2018-02-19 NOTE — Patient Instructions (Signed)
[facility] Discharge Instructions for Patients Receiving Chemotherapy  Today you received the following chemotherapy agents Alimta  To help prevent nausea and vomiting after your treatment, we encourage you to take your nausea medication as directed   If you develop nausea and vomiting that is not controlled by your nausea medication, call the clinic.   BELOW ARE SYMPTOMS THAT SHOULD BE REPORTED IMMEDIATELY:  *FEVER GREATER THAN 100.5 F  *CHILLS WITH OR WITHOUT FEVER  NAUSEA AND VOMITING THAT IS NOT CONTROLLED WITH YOUR NAUSEA MEDICATION  *UNUSUAL SHORTNESS OF BREATH  *UNUSUAL BRUISING OR BLEEDING  TENDERNESS IN MOUTH AND THROAT WITH OR WITHOUT PRESENCE OF ULCERS  *URINARY PROBLEMS  *BOWEL PROBLEMS  UNUSUAL RASH Items with * indicate a potential emergency and should be followed up as soon as possible.  Feel free to call the clinic should you have any questions or concerns. The clinic phone number is [phone].  Please show the [facility] at check-in to the Emergency Department and triage nurse.

## 2018-03-08 NOTE — Assessment & Plan Note (Addendum)
This is a very pleasant 67 year old white female with metastatic non-small cell lung cancer, adenocarcinoma status post induction systemic chemotherapy with carboplatin and Alimta with partial response. The patient is currently on maintenance treatment with single agent Alimta status post 65 cycles and has been tolerating this treatment fairly well. I recommended for the patient to proceed with cycle #66 today as scheduled. The patient will have a restaging CT scan of the chest, abdomen, pelvis prior to her next visit.  She will follow-up in 3 weeks for evaluation prior to cycle #67 and to review her restaging CT scan results.  She was advised to call immediately if she has any concerning symptoms in the interval. The patient voices understanding of current disease status and treatment options and is in agreement with the current care plan. All questions were answered. The patient knows to call the clinic with any problems, questions or concerns. We can certainly see the patient much sooner if necessary.

## 2018-03-08 NOTE — Progress Notes (Signed)
[place] OFFICE PROGRESS NOTE  Alexis Roers, MD [address] [place] [address]  DIAGNOSIS: Stage IV (T2a, N0, M1b) non-small cell lung cancer, adenocarcinoma with negative EGFR and ALK mutations diagnosed in January 2015 and presented with right upper lobe lung mass in addition to a solitary brain metastasis.  PRIOR THERAPY: 1) Status post stereotactic radiotherapy to a solitary right parietal brain lesion under the care of Dr. [name] on 10/16/2013. 2) Status post palliative radiotherapy to the right lung tumor under the care of Dr. [name] completed on 12/05/2013. 3) Systemic chemotherapy with carboplatin for AUC of 5 and Alimta 500 mg/M2 every 3 weeks. First dose Jan 06 2014. Status post 6 cycles.  CURRENT THERAPY: Systemic chemotherapy with maintenance Alimta 500 MG/M2 every 3 weeks, status post 65 cycles.  INTERVAL HISTORY: Alexis [name] 67 y.o. female returns for routine follow-up visit by herself.  The patient is feeling fine today with no specific complaints.  The patient denies fevers and chills.  Denies chest pain, shortness of breath, cough, hemoptysis.  Denies nausea, vomiting, constipation, diarrhea.  Denies recent weight loss or night sweats.  She continues to tolerate treatment with Alimta fairly well.  The patient is here for evaluation prior to starting cycle #66 of her treatment.  MEDICAL HISTORY: Past Medical History:  Diagnosis Date  . Anxiety   . Anxiety 06/20/2016  . Cervical cancer ([place])   . Encounter for antineoplastic chemotherapy 07/20/2015  . 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  MEDICATIONS:  Current Outpatient Medications  Medication Sig Dispense Refill  . acetaminophen (TYLENOL) 500 MG tablet Take 500 mg by mouth every 6 (six) hours as needed for mild pain or headache. Reported on 11/02/2015    . ALPRAZolam (XANAX) 0.25  MG tablet Take 1 tablet (0.25 mg total) by mouth 3 (three) times daily as needed. for anxiety 45 tablet 0  . Ascorbic Acid (VITAMIN C GUMMIE PO) Take 1 each by mouth every morning.    Marland Kitchen dexamethasone (DECADRON) 4 MG tablet TAKE 1 TABLET BY MOUTH TWICE A DAY THE DAY BEFORE, DAY OF AND DAY AFTER THE CHEMOTHERAPY EVERY 3 WKS 40 tablet 1  . folic acid (FOLVITE) 1 MG tablet TAKE 1 TABLET BY MOUTH EVERY DAY 90 tablet 0  . Multiple Vitamin (MULTIVITAMIN WITH MINERALS) TABS tablet Take 1 tablet by mouth every morning.    Marland Kitchen omeprazole (PRILOSEC) 20 MG capsule Take 1 capsule (20 mg total) by mouth daily. As needed for reflux or indigestion. 42 capsule PRN  . ondansetron (ZOFRAN) 8 MG tablet Take 1 tablet (8 mg total) by mouth every 8 (eight) hours as needed for nausea or vomiting. 30 tablet 0  . OVER THE COUNTER MEDICATION Take 1 tablet by mouth every morning. (Vitamin A)    . prochlorperazine (COMPAZINE) 10 MG tablet Take 1 tablet (10 mg total) by mouth every 6 (six) hours as needed for nausea or vomiting. 60 tablet 0  . senna-docusate (SENOKOT-S) 8.6-50 MG tablet Take 1 tablet by mouth daily. 30 tablet prn  . benzonatate (TESSALON) 100 MG capsule Take 1 capsule (100 mg total) by mouth every 8 (eight) hours. (Patient not taking: Reported on 03/12/2018) 21 capsule 0  . pentoxifylline (TRENTAL) 400 MG CR tablet TAKE 1 TABLET BY MOUTH TWICE A DAY (TAKE WITH VITAMIN E 400MG TWICE A  DAY)  3   No current facility-administered medications for this visit.     SURGICAL HISTORY:  Past Surgical History:  Procedure Laterality Date  . ABDOMINAL HYSTERECTOMY    . COLOSTOMY TAKEDOWN N/A 07/10/2014   Procedure: LAPAROSCOPIC LYSIS OF ADHESIONS (90 MIN) LAPAROSCOPIC ASSISTED COLOSTOMY CLOSURE, RIGID PROCTOSIGMOIDOSCOPY;  Surgeon: Jackolyn Confer, MD;  Location: [facility];  Service: General;  Laterality: N/A;  . LAPAROTOMY N/A 11/03/2013   Procedure: EXPLORATORY LAPAROTOMY, DRAINAGE OF INTRA  ABDOMINAL ABSCESSES, MOBILIZATION  OF SPLENIC FLEXURE, SIGMOID COLECTOMY WITH COLOSTOMY;  Surgeon: Odis Hollingshead, MD;  Location: [facility];  Service: General;  Laterality: N/A;  . VIDEO BRONCHOSCOPY Bilateral 08/30/2013   Procedure: VIDEO BRONCHOSCOPY WITH FLUORO;  Surgeon: Tanda Rockers, MD;  Location: [facility];  Service: Cardiopulmonary;  Laterality: Bilateral;    REVIEW OF SYSTEMS:   Review of Systems  Constitutional: Negative for appetite change, chills, fatigue, fever and unexpected weight change.  HENT:   Negative for mouth sores, nosebleeds, sore throat and trouble swallowing.   Eyes: Negative for eye problems and icterus.  Respiratory: Negative for cough, hemoptysis, shortness of breath and wheezing.   Cardiovascular: Negative for chest pain and leg swelling.  Gastrointestinal: Negative for abdominal pain, constipation, diarrhea, nausea and vomiting.  Genitourinary: Negative for bladder incontinence, difficulty urinating, dysuria, frequency and hematuria.   Musculoskeletal: Negative for back pain, gait problem, neck pain and neck stiffness.  Skin: Negative for itching and rash.  Neurological: Negative for dizziness, extremity weakness, gait problem, headaches, light-headedness and seizures.  Hematological: Negative for adenopathy. Does not bruise/bleed easily.  Psychiatric/Behavioral: Negative for confusion, depression and sleep disturbance. The patient is not nervous/anxious.     PHYSICAL EXAMINATION:  Blood pressure 123/66, pulse 69, temperature 97.8 F (36.6 C), temperature source Oral, resp. rate 18, height 5' 4"  (1.626 m), weight 179 lb 11.2 oz (81.5 kg), SpO2 99 %.  ECOG PERFORMANCE STATUS: 1 - Symptomatic but completely ambulatory  Physical Exam  Constitutional: Oriented to person, place, and time and well-developed, well-nourished, and in no distress. No distress.  HENT:  Head: Normocephalic and atraumatic.  Mouth/Throat: Oropharynx is clear and moist. No oropharyngeal exudate.  Eyes: Conjunctivae  are normal. Right eye exhibits no discharge. Left eye exhibits no discharge. No scleral icterus.  Neck: Normal range of motion. Neck supple.  Cardiovascular: Normal rate, regular rhythm, normal heart sounds and intact distal pulses.   Pulmonary/Chest: Effort normal and breath sounds normal. No respiratory distress. No wheezes. No rales.  Abdominal: Soft. Bowel sounds are normal. Exhibits no distension and no mass. There is no tenderness.  Musculoskeletal: Normal range of motion. Exhibits no edema.  Lymphadenopathy:    No cervical adenopathy.  Neurological: Alert and oriented to person, place, and time. Exhibits normal muscle tone. Gait normal. Coordination normal.  Skin: Skin is warm and dry. No rash noted. Not diaphoretic. No erythema. No pallor.  Psychiatric: Mood, memory and judgment normal.  Vitals reviewed.  LABORATORY DATA: Lab Results  Component Value Date   WBC 7.0 03/12/2018   HGB 12.2 03/12/2018   HCT 36.8 03/12/2018   MCV 97.3 03/12/2018   PLT 289 03/12/2018      Chemistry      Component Value Date/Time   NA 140 02/19/2018 0824   NA 139 08/14/2017 0837   K 4.2 02/19/2018 0824   K 4.4 08/14/2017 0837   CL 106 02/19/2018 0824   CO2 24 02/19/2018 0824   CO2 24 08/14/2017 0837   BUN 15 02/19/2018  0824   BUN 13.3 08/14/2017 0837   CREATININE 0.79 02/19/2018 0824   CREATININE 0.8 08/14/2017 0837      Component Value Date/Time   CALCIUM 9.6 02/19/2018 0824   CALCIUM 9.3 08/14/2017 0837   ALKPHOS 113 02/19/2018 0824   ALKPHOS 107 08/14/2017 0837   AST 12 02/19/2018 0824   AST 12 08/14/2017 0837   ALT 9 02/19/2018 0824   ALT 12 08/14/2017 0837   BILITOT 0.4 02/19/2018 0824   BILITOT 0.37 08/14/2017 0837       RADIOGRAPHIC STUDIES:  No results found.   ASSESSMENT/PLAN:  Primary cancer of right upper lobe of lung This is a very pleasant 67 year old white female with metastatic non-small cell lung cancer, adenocarcinoma status post induction systemic  chemotherapy with carboplatin and Alimta with partial response. The patient is currently on maintenance treatment with single agent Alimta status post 65 cycles and has been tolerating this treatment fairly well. I recommended for the patient to proceed with cycle #66 today as scheduled. The patient will have a restaging CT scan of the chest, abdomen, pelvis prior to her next visit.  She will follow-up in 3 weeks for evaluation prior to cycle #67 and to review her restaging CT scan results.  She was advised to call immediately if she has any concerning symptoms in the interval. The patient voices understanding of current disease status and treatment options and is in agreement with the current care plan. All questions were answered. The patient knows to call the clinic with any problems, questions or concerns. We can certainly see the patient much sooner if necessary.   Orders Placed This Encounter  Procedures  . CT ABDOMEN PELVIS W CONTRAST    Standing Status:   Future    Standing Expiration Date:   03/13/2019    Order Specific Question:   If indicated for the ordered procedure, I authorize the administration of contrast media per Radiology protocol    Answer:   Yes    Order Specific Question:   Preferred imaging location?    Answer:   [facility]    Order Specific Question:   Radiology Contrast Protocol - do NOT remove file path    Answer:   [email or web address]    Order Specific Question:   ** REASON FOR EXAM (FREE TEXT)    Answer:   Lung cancer. Restaging.  . CT CHEST W CONTRAST    Standing Status:   Future    Standing Expiration Date:   03/13/2019    Order Specific Question:   If indicated for the ordered procedure, I authorize the administration of contrast media per Radiology protocol    Answer:   Yes    Order Specific Question:   Preferred imaging location?    Answer:   [facility]    Order Specific Question:   Radiology Contrast Protocol  - do NOT remove file path    Answer:   [email or web address]    Order Specific Question:   ** REASON FOR EXAM (FREE TEXT)    Answer:   Lung cancer. Restaging.   Mikey Bussing, DNP, AGPCNP-BC, AOCNP 03/12/18

## 2018-03-12 ENCOUNTER — Encounter: Payer: Self-pay | Admitting: Oncology

## 2018-03-12 ENCOUNTER — Telehealth: Payer: Self-pay

## 2018-03-12 ENCOUNTER — Inpatient Hospital Stay (HOSPITAL_BASED_OUTPATIENT_CLINIC_OR_DEPARTMENT_OTHER): Payer: Medicare Other | Admitting: Oncology

## 2018-03-12 ENCOUNTER — Other Ambulatory Visit: Payer: Self-pay

## 2018-03-12 ENCOUNTER — Inpatient Hospital Stay: Payer: Medicare Other | Attending: Internal Medicine

## 2018-03-12 ENCOUNTER — Inpatient Hospital Stay: Payer: Medicare Other

## 2018-03-12 VITALS — BP 123/66 | HR 69 | Temp 97.8°F | Resp 18 | Ht 64.0 in | Wt 179.7 lb

## 2018-03-12 DIAGNOSIS — Z9221 Personal history of antineoplastic chemotherapy: Secondary | ICD-10-CM

## 2018-03-12 DIAGNOSIS — C3411 Malignant neoplasm of upper lobe, right bronchus or lung: Secondary | ICD-10-CM | POA: Insufficient documentation

## 2018-03-12 DIAGNOSIS — C7931 Secondary malignant neoplasm of brain: Secondary | ICD-10-CM

## 2018-03-12 DIAGNOSIS — Z5111 Encounter for antineoplastic chemotherapy: Secondary | ICD-10-CM

## 2018-03-12 DIAGNOSIS — Z923 Personal history of irradiation: Secondary | ICD-10-CM | POA: Insufficient documentation

## 2018-03-12 LAB — CBC WITH DIFFERENTIAL/PLATELET
Basophils Absolute: 0 10*3/uL (ref 0.0–0.1)
Basophils Relative: 0 %
Eosinophils Absolute: 0 10*3/uL (ref 0.0–0.5)
Eosinophils Relative: 0 %
HCT: 36.8 % (ref 34.8–46.6)
Hemoglobin: 12.2 g/dL (ref 11.6–15.9)
Lymphocytes Relative: 5 %
Lymphs Abs: 0.3 10*3/uL — ABNORMAL LOW (ref 0.9–3.3)
MCH: 32.3 pg (ref 25.1–34.0)
MCHC: 33.2 g/dL (ref 31.5–36.0)
MCV: 97.3 fL (ref 79.5–101.0)
Monocytes Absolute: 0.3 10*3/uL (ref 0.1–0.9)
Monocytes Relative: 4 %
Neutro Abs: 6.4 10*3/uL (ref 1.5–6.5)
Neutrophils Relative %: 91 %
Platelets: 289 10*3/uL (ref 145–400)
RBC: 3.78 MIL/uL (ref 3.70–5.45)
RDW: 15.5 % — ABNORMAL HIGH (ref 11.2–14.5)
WBC: 7 10*3/uL (ref 3.9–10.3)

## 2018-03-12 LAB — COMPREHENSIVE METABOLIC PANEL
ALT: 15 U/L (ref 0–44)
AST: 11 U/L — ABNORMAL LOW (ref 15–41)
Albumin: 3.7 g/dL (ref 3.5–5.0)
Alkaline Phosphatase: 112 U/L (ref 38–126)
Anion gap: 9 (ref 5–15)
BUN: 12 mg/dL (ref 8–23)
CO2: 25 mmol/L (ref 22–32)
Calcium: 9.8 mg/dL (ref 8.9–10.3)
Chloride: 107 mmol/L (ref 98–111)
Creatinine, Ser: 0.77 mg/dL (ref 0.44–1.00)
GFR calc Af Amer: >60 mL/min (ref >60)
GFR calc non Af Amer: >60 mL/min (ref >60)
Glucose, Bld: 158 mg/dL — ABNORMAL HIGH (ref 70–99)
Potassium: 4.2 mmol/L (ref 3.5–5.1)
Sodium: 141 mmol/L (ref 135–145)
Total Bilirubin: 0.4 mg/dL (ref 0.3–1.2)
Total Protein: 6.9 g/dL (ref 6.5–8.1)

## 2018-03-12 MED ORDER — DEXAMETHASONE SODIUM PHOSPHATE 10 MG/ML IJ SOLN
INTRAMUSCULAR | Status: AC
  Filled 2018-03-12: qty 1

## 2018-03-12 MED ORDER — ONDANSETRON HCL 8 MG PO TABS
ORAL_TABLET | ORAL | Status: AC
  Filled 2018-03-12: qty 1

## 2018-03-12 MED ORDER — SODIUM CHLORIDE 0.9 % IV SOLN
Freq: Once | INTRAVENOUS | Status: AC
  Administered 2018-03-12: 10:00:00 via INTRAVENOUS

## 2018-03-12 MED ORDER — CYANOCOBALAMIN 1000 MCG/ML IJ SOLN: 1000.0000 ug | Freq: Once | INTRAMUSCULAR | Status: DC

## 2018-03-12 MED ORDER — ONDANSETRON HCL 8 MG PO TABS
8.0000 mg | ORAL_TABLET | Freq: Once | ORAL | Status: AC
  Administered 2018-03-12: 8 mg via ORAL

## 2018-03-12 MED ORDER — DEXAMETHASONE SODIUM PHOSPHATE 10 MG/ML IJ SOLN
10.0000 mg | Freq: Once | INTRAMUSCULAR | Status: AC
  Administered 2018-03-12: 10 mg via INTRAVENOUS

## 2018-03-12 MED ORDER — PEMETREXED DISODIUM CHEMO INJECTION 500 MG
500.0000 mg/m2 | Freq: Once | INTRAVENOUS | Status: AC
  Administered 2018-03-12: 900 mg via INTRAVENOUS
  Filled 2018-03-12: qty 20

## 2018-03-12 MED ORDER — CYANOCOBALAMIN 1000 MCG/ML IJ SOLN
INTRAMUSCULAR | Status: AC
Start: 2018-03-12 — End: ?
  Filled 2018-03-12: qty 1

## 2018-03-12 NOTE — Patient Instructions (Signed)
[facility] Discharge Instructions for Patients Receiving Chemotherapy  Today you received the following chemotherapy agents Alimta  To help prevent nausea and vomiting after your treatment, we encourage you to take your nausea medication as directed   If you develop nausea and vomiting that is not controlled by your nausea medication, call the clinic.   BELOW ARE SYMPTOMS THAT SHOULD BE REPORTED IMMEDIATELY:  *FEVER GREATER THAN 100.5 F  *CHILLS WITH OR WITHOUT FEVER  NAUSEA AND VOMITING THAT IS NOT CONTROLLED WITH YOUR NAUSEA MEDICATION  *UNUSUAL SHORTNESS OF BREATH  *UNUSUAL BRUISING OR BLEEDING  TENDERNESS IN MOUTH AND THROAT WITH OR WITHOUT PRESENCE OF ULCERS  *URINARY PROBLEMS  *BOWEL PROBLEMS  UNUSUAL RASH Items with * indicate a potential emergency and should be followed up as soon as possible.  Feel free to call the clinic should you have any questions or concerns. The clinic phone number is [phone].  Please show the [facility] at check-in to the Emergency Department and triage nurse.

## 2018-03-12 NOTE — Telephone Encounter (Signed)
Printed avs and calender of upcoming appointment. Per 7/15 los

## 2018-03-30 ENCOUNTER — Ambulatory Visit (HOSPITAL_COMMUNITY)
Admission: RE | Admit: 2018-03-30 | Discharge: 2018-03-30 | Disposition: A | Discharge status: Home / Self Care | Payer: Medicare Other | Source: Ambulatory Visit | Attending: Oncology | Admitting: Oncology

## 2018-03-30 DIAGNOSIS — J439 Emphysema, unspecified: Secondary | ICD-10-CM | POA: Diagnosis not present

## 2018-03-30 DIAGNOSIS — C3411 Malignant neoplasm of upper lobe, right bronchus or lung: Secondary | ICD-10-CM | POA: Diagnosis not present

## 2018-03-30 DIAGNOSIS — I700 Atherosclerosis of aorta: Secondary | ICD-10-CM | POA: Diagnosis not present

## 2018-03-30 DIAGNOSIS — K439 Ventral hernia without obstruction or gangrene: Secondary | ICD-10-CM | POA: Diagnosis not present

## 2018-03-30 MED ORDER — IOPAMIDOL (ISOVUE-300) INJECTION 61%
INTRAVENOUS | Status: AC
  Filled 2018-03-30: qty 100

## 2018-03-30 MED ORDER — IOPAMIDOL (ISOVUE-300) INJECTION 61%
100.0000 mL | Freq: Once | INTRAVENOUS | Status: AC | PRN
  Administered 2018-03-30: 100 mL via INTRAVENOUS

## 2018-04-02 ENCOUNTER — Telehealth: Payer: Self-pay | Admitting: Internal Medicine

## 2018-04-02 ENCOUNTER — Inpatient Hospital Stay: Payer: Medicare Other

## 2018-04-02 ENCOUNTER — Inpatient Hospital Stay (HOSPITAL_BASED_OUTPATIENT_CLINIC_OR_DEPARTMENT_OTHER): Payer: Medicare Other | Admitting: Internal Medicine

## 2018-04-02 ENCOUNTER — Inpatient Hospital Stay: Payer: Medicare Other | Attending: Internal Medicine

## 2018-04-02 ENCOUNTER — Encounter: Payer: Self-pay | Admitting: Internal Medicine

## 2018-04-02 VITALS — BP 133/88 | HR 97 | Temp 98.3°F | Resp 18 | Ht 64.0 in | Wt 181.1 lb

## 2018-04-02 DIAGNOSIS — Z9221 Personal history of antineoplastic chemotherapy: Secondary | ICD-10-CM | POA: Insufficient documentation

## 2018-04-02 DIAGNOSIS — Z923 Personal history of irradiation: Secondary | ICD-10-CM | POA: Diagnosis not present

## 2018-04-02 DIAGNOSIS — Z5111 Encounter for antineoplastic chemotherapy: Secondary | ICD-10-CM | POA: Insufficient documentation

## 2018-04-02 DIAGNOSIS — Z79899 Other long term (current) drug therapy: Secondary | ICD-10-CM | POA: Insufficient documentation

## 2018-04-02 DIAGNOSIS — T451X5A Adverse effect of antineoplastic and immunosuppressive drugs, initial encounter: Principal | ICD-10-CM

## 2018-04-02 DIAGNOSIS — C7931 Secondary malignant neoplasm of brain: Secondary | ICD-10-CM | POA: Diagnosis not present

## 2018-04-02 DIAGNOSIS — C3411 Malignant neoplasm of upper lobe, right bronchus or lung: Secondary | ICD-10-CM

## 2018-04-02 DIAGNOSIS — M7989 Other specified soft tissue disorders: Secondary | ICD-10-CM | POA: Diagnosis not present

## 2018-04-02 DIAGNOSIS — C7949 Secondary malignant neoplasm of other parts of nervous system: Secondary | ICD-10-CM

## 2018-04-02 DIAGNOSIS — F419 Anxiety disorder, unspecified: Secondary | ICD-10-CM

## 2018-04-02 DIAGNOSIS — F411 Generalized anxiety disorder: Secondary | ICD-10-CM

## 2018-04-02 DIAGNOSIS — D6481 Anemia due to antineoplastic chemotherapy: Secondary | ICD-10-CM

## 2018-04-02 LAB — CBC WITH DIFFERENTIAL/PLATELET
Basophils Absolute: 0 10*3/uL (ref 0.0–0.1)
Basophils Relative: 0 %
Eosinophils Absolute: 0 10*3/uL (ref 0.0–0.5)
Eosinophils Relative: 0 %
HCT: 37.5 % (ref 34.8–46.6)
Hemoglobin: 12.6 g/dL (ref 11.6–15.9)
Lymphocytes Relative: 3 %
Lymphs Abs: 0.4 10*3/uL — ABNORMAL LOW (ref 0.9–3.3)
MCH: 32.7 pg (ref 25.1–34.0)
MCHC: 33.5 g/dL (ref 31.5–36.0)
MCV: 97.7 fL (ref 79.5–101.0)
Monocytes Absolute: 0.4 10*3/uL (ref 0.1–0.9)
Monocytes Relative: 4 %
Neutro Abs: 9.9 10*3/uL — ABNORMAL HIGH (ref 1.5–6.5)
Neutrophils Relative %: 93 %
Platelets: 278 10*3/uL (ref 145–400)
RBC: 3.83 MIL/uL (ref 3.70–5.45)
RDW: 15.7 % — ABNORMAL HIGH (ref 11.2–14.5)
WBC: 10.7 10*3/uL — ABNORMAL HIGH (ref 3.9–10.3)

## 2018-04-02 LAB — COMPREHENSIVE METABOLIC PANEL
ALT: 14 U/L (ref 0–44)
AST: 12 U/L — ABNORMAL LOW (ref 15–41)
Albumin: 3.5 g/dL (ref 3.5–5.0)
Alkaline Phosphatase: 115 U/L (ref 38–126)
Anion gap: 10 (ref 5–15)
BUN: 13 mg/dL (ref 8–23)
CO2: 23 mmol/L (ref 22–32)
Calcium: 9.7 mg/dL (ref 8.9–10.3)
Chloride: 107 mmol/L (ref 98–111)
Creatinine, Ser: 0.85 mg/dL (ref 0.44–1.00)
GFR calc Af Amer: >60 mL/min (ref >60)
GFR calc non Af Amer: >60 mL/min (ref >60)
Glucose, Bld: 168 mg/dL — ABNORMAL HIGH (ref 70–99)
Potassium: 4.5 mmol/L (ref 3.5–5.1)
Sodium: 140 mmol/L (ref 135–145)
Total Bilirubin: 0.4 mg/dL (ref 0.3–1.2)
Total Protein: 7 g/dL (ref 6.5–8.1)

## 2018-04-02 MED ORDER — SODIUM CHLORIDE 0.9 % IV SOLN
500.0000 mg/m2 | Freq: Once | INTRAVENOUS | Status: AC
  Administered 2018-04-02: 900 mg via INTRAVENOUS
  Filled 2018-04-02: qty 20

## 2018-04-02 MED ORDER — DEXAMETHASONE SODIUM PHOSPHATE 10 MG/ML IJ SOLN
INTRAMUSCULAR | Status: AC
  Filled 2018-04-02: qty 1

## 2018-04-02 MED ORDER — CYANOCOBALAMIN 1000 MCG/ML IJ SOLN
INTRAMUSCULAR | Status: AC
  Filled 2018-04-02: qty 1

## 2018-04-02 MED ORDER — CYANOCOBALAMIN 1000 MCG/ML IJ SOLN: 1000.0000 ug | Freq: Once | INTRAMUSCULAR | Status: DC

## 2018-04-02 MED ORDER — DEXAMETHASONE SODIUM PHOSPHATE 10 MG/ML IJ SOLN
10.0000 mg | Freq: Once | INTRAMUSCULAR | Status: AC
  Administered 2018-04-02: 10 mg via INTRAVENOUS

## 2018-04-02 MED ORDER — SODIUM CHLORIDE 0.9 % IV SOLN
Freq: Once | INTRAVENOUS | Status: AC
  Administered 2018-04-02: 10:00:00 via INTRAVENOUS
  Filled 2018-04-02: qty 250

## 2018-04-02 MED ORDER — ONDANSETRON HCL 8 MG PO TABS: 8.0000 mg | ORAL_TABLET | Freq: Once | ORAL | Status: DC

## 2018-04-02 MED ORDER — ALPRAZOLAM 0.25 MG PO TABS: 0.2500 mg | ORAL_TABLET | Freq: Three times a day (TID) | ORAL | 0 refills | Status: DC | PRN

## 2018-04-02 MED ORDER — ONDANSETRON HCL 8 MG PO TABS: ORAL_TABLET | ORAL | 0 refills | Status: DC

## 2018-04-02 NOTE — Progress Notes (Signed)
[place] [phone]   [phone]  OFFICE PROGRESS NOTE  [name], MD [address] [place] [address]  DIAGNOSIS: Stage IV (T2a, N0, M1b) non-small cell lung cancer, adenocarcinoma with negative EGFR and ALK mutations diagnosed in January 2015 and presented with right upper lobe lung mass in addition to a solitary brain metastasis.  PRIOR THERAPY: 1) Status post stereotactic radiotherapy to a solitary right parietal brain lesion under the care of Dr. [name] on 10/16/2013. 2) Status post palliative radiotherapy to the right lung tumor under the care of Dr. [name] completed on 12/05/2013. 3) Systemic chemotherapy with carboplatin for AUC of 5 and Alimta 500 mg/M2 every 3 weeks. First dose Jan 06 2014. Status post 6 cycles.  CURRENT THERAPY: Systemic chemotherapy with maintenance Alimta 500 MG/M2 every 3 weeks, status post 65 cycles.  INTERVAL HISTORY: Alexis [name] 67 y.o. female returns to the clinic today for follow-up visit accompanied by her husband.  The patient is feeling fine with no concerning complaints.  She denied having any chest pain, shortness breath, cough or hemoptysis.  She denied having any recent weight loss or night sweats.  She has no nausea, vomiting, diarrhea or constipation.  She denied having any headache or visual changes.  She continues to tolerate her treatment with maintenance Alimta fairly well.  She had repeat CT scan of the chest, abdomen and pelvis performed recently and she is here for evaluation and discussion of her risk her results.    MEDICAL HISTORY: Past Medical History:  Diagnosis Date  . Anxiety   . Anxiety 06/20/2016  . Cervical cancer ([facility])   . Encounter for antineoplastic chemotherapy 07/20/2015  . 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  MEDICATIONS:  Current Outpatient  Medications  Medication Sig Dispense Refill  . acetaminophen (TYLENOL) 500 MG tablet Take 500 mg by mouth every 6 (six) hours as needed for mild pain or headache. Reported on 11/02/2015    . ALPRAZolam (XANAX) 0.25 MG tablet Take 1 tablet (0.25 mg total) by mouth 3 (three) times daily as needed. for anxiety 45 tablet 0  . Ascorbic Acid (VITAMIN C GUMMIE PO) Take 1 each by mouth every morning.    . benzonatate (TESSALON) 100 MG capsule Take 1 capsule (100 mg total) by mouth every 8 (eight) hours. (Patient not taking: Reported on 03/12/2018) 21 capsule 0  . dexamethasone (DECADRON) 4 MG tablet TAKE 1 TABLET BY MOUTH TWICE A DAY THE DAY BEFORE, DAY OF AND DAY AFTER THE CHEMOTHERAPY EVERY 3 WKS 40 tablet 1  . folic acid (FOLVITE) 1 MG tablet TAKE 1 TABLET BY MOUTH EVERY DAY 90 tablet 0  . Multiple Vitamin (MULTIVITAMIN WITH MINERALS) TABS tablet Take 1 tablet by mouth every morning.    Marland Kitchen omeprazole (PRILOSEC) 20 MG capsule Take 1 capsule (20 mg total) by mouth daily. As needed for reflux or indigestion. 42 capsule PRN  . ondansetron (ZOFRAN) 8 MG tablet Take 1 tablet (8 mg total) by mouth every 8 (eight) hours as needed for nausea or vomiting. 30 tablet 0  . OVER THE COUNTER MEDICATION Take 1 tablet by mouth every morning. (Vitamin A)    . pentoxifylline (TRENTAL) 400 MG CR tablet TAKE 1 TABLET BY MOUTH TWICE A DAY (TAKE WITH VITAMIN E 400MG TWICE A DAY)  3  . prochlorperazine (COMPAZINE) 10 MG tablet Take 1 tablet (10 mg total) by mouth every 6 (six) hours as needed for nausea or vomiting. 60 tablet 0  . senna-docusate (SENOKOT-S) 8.6-50 MG tablet Take 1 tablet by mouth daily. 30 tablet prn   No current facility-administered medications for this visit.     SURGICAL HISTORY:  Past Surgical History:  Procedure Laterality Date  . ABDOMINAL HYSTERECTOMY    . COLOSTOMY TAKEDOWN N/A 07/10/2014   Procedure: LAPAROSCOPIC LYSIS OF ADHESIONS (90 MIN) LAPAROSCOPIC ASSISTED COLOSTOMY CLOSURE, RIGID  PROCTOSIGMOIDOSCOPY;  Surgeon: Jackolyn Confer, MD;  Location: [facility];  Service: General;  Laterality: N/A;  . LAPAROTOMY N/A 11/03/2013   Procedure: EXPLORATORY LAPAROTOMY, DRAINAGE OF INTRA  ABDOMINAL ABSCESSES, MOBILIZATION OF SPLENIC FLEXURE, SIGMOID COLECTOMY WITH COLOSTOMY;  Surgeon: Odis Hollingshead, MD;  Location: [facility];  Service: General;  Laterality: N/A;  . VIDEO BRONCHOSCOPY Bilateral 08/30/2013   Procedure: VIDEO BRONCHOSCOPY WITH FLUORO;  Surgeon: Tanda Rockers, MD;  Location: [facility];  Service: Cardiopulmonary;  Laterality: Bilateral;    REVIEW OF SYSTEMS:  Constitutional: negative Eyes: negative Ears, nose, mouth, throat, and face: negative Respiratory: negative Cardiovascular: negative Gastrointestinal: negative Genitourinary:negative Integument/breast: negative Hematologic/lymphatic: negative Musculoskeletal:negative Neurological: negative Behavioral/Psych: negative Endocrine: negative Allergic/Immunologic: negative   PHYSICAL EXAMINATION: General appearance: alert, cooperative and no distress Head: Normocephalic, without obvious abnormality, atraumatic Neck: no adenopathy, no JVD, supple, symmetrical, trachea midline and thyroid not enlarged, symmetric, no tenderness/mass/nodules Lymph nodes: Cervical, supraclavicular, and axillary nodes normal. Resp: clear to auscultation bilaterally Back: symmetric, no curvature. ROM normal. No CVA tenderness. Cardio: regular rate and rhythm, S1, S2 normal, no murmur, click, rub or gallop GI: soft, non-tender; bowel sounds normal; no masses,  no organomegaly Extremities: extremities normal, atraumatic, no cyanosis or edema Neurologic: Alert and oriented X 3, normal strength and tone. Normal symmetric reflexes. Normal coordination and gait  ECOG PERFORMANCE STATUS: 0 - Asymptomatic  Blood pressure 133/88, pulse 97, temperature 98.3 F (36.8 C), temperature source Oral, resp. rate 18, height '5\' 4"'$  (1.626 m), weight 181 lb  1.6 oz (82.1 kg), SpO2 100 %.  LABORATORY DATA: Lab Results  Component Value Date   WBC 10.7 (H) 04/02/2018   HGB 12.6 04/02/2018   HCT 37.5 04/02/2018   MCV 97.7 04/02/2018   PLT 278 04/02/2018      Chemistry      Component Value Date/Time   NA 141 03/12/2018 0844   NA 139 08/14/2017 0837   K 4.2 03/12/2018 0844   K 4.4 08/14/2017 0837   CL 107 03/12/2018 0844   CO2 25 03/12/2018 0844   CO2 24 08/14/2017 0837   BUN 12 03/12/2018 0844   BUN 13.3 08/14/2017 0837   CREATININE 0.77 03/12/2018 0844   CREATININE 0.8 08/14/2017 0837      Component Value Date/Time   CALCIUM 9.8 03/12/2018 0844   CALCIUM 9.3 08/14/2017 0837   ALKPHOS 112 03/12/2018 0844   ALKPHOS 107 08/14/2017 0837   AST 11 (L) 03/12/2018 0844   AST 12 08/14/2017 0837   ALT 15 03/12/2018 0844   ALT 12 08/14/2017 0837   BILITOT 0.4 03/12/2018 0844   BILITOT 0.37 08/14/2017 0837       RADIOGRAPHIC STUDIES: Ct Chest W Contrast  Result Date: 03/30/2018 CLINICAL DATA:  Patient with history of right upper lobe carcinoma. EXAM: CT CHEST, ABDOMEN, AND PELVIS WITH CONTRAST TECHNIQUE: Multidetector CT imaging of the chest, abdomen and pelvis was performed following the standard protocol during bolus  administration of intravenous contrast. CONTRAST:  175m ISOVUE-300 IOPAMIDOL (ISOVUE-300) INJECTION 61% COMPARISON:  CT CAP 01/26/2018. FINDINGS: CT CHEST FINDINGS Cardiovascular: Normal heart size. Thoracic aortic vascular calcifications. Mediastinum/Nodes: No enlarged axillary, mediastinal or hilar lymphadenopathy. Normal appearance of the esophagus. Lungs/Pleura: Central airways are patent. Dependent atelectasis within the bilateral lower lobes. Centrilobular and paraseptal emphysematous changes. Biapical pleuroparenchymal thickening/scarring. Unchanged 3.9 x 3.2 cm right apical mass (image 35; series 4), previously 3.7 x 3.2 cm. No new or enlarging pulmonary nodules or masses. No pleural effusion or pneumothorax.  Musculoskeletal: Thoracic spine degenerative changes. No aggressive or acute appearing osseous lesions. CT ABDOMEN PELVIS FINDINGS Hepatobiliary: Liver is normal in size and contour. Gallbladder is unremarkable. No intrahepatic or extrahepatic biliary ductal dilatation. Pancreas: Unremarkable Spleen: Unremarkable Adrenals/Urinary Tract: Normal adrenal glands. Kidneys enhance symmetrically with contrast. No hydronephrosis. Urinary bladder is unremarkable. Stomach/Bowel: Normal morphology of the stomach. No evidence for small bowel obstruction. No free fluid or free intraperitoneal air. Postsurgical changes involving the sigmoid colon. No evidence for bowel obstruction. Vascular/Lymphatic: Normal caliber abdominal aorta. Peripheral calcified atherosclerotic plaque. No retroperitoneal lymphadenopathy. Reproductive: Status post hysterectomy. Other: Anterior abdominal wall postsurgical changes. Unchanged small fat containing anterior abdominal wall hernias. Musculoskeletal: Lumbar spine degenerative changes. No aggressive or acute appearing osseous lesions. IMPRESSION: 1. Stable right apical mass. 2. No evidence for new or progressive disease in the chest, abdomen or pelvis. 3. Aortic Atherosclerosis (ICD10-I70.0) and Emphysema (ICD10-J43.9). Electronically Signed   By: DLovey NewcomerM.D.   On: 03/30/2018 11:12   Ct Abdomen Pelvis W Contrast  Result Date: 03/30/2018 CLINICAL DATA:  Patient with history of right upper lobe carcinoma. EXAM: CT CHEST, ABDOMEN, AND PELVIS WITH CONTRAST TECHNIQUE: Multidetector CT imaging of the chest, abdomen and pelvis was performed following the standard protocol during bolus administration of intravenous contrast. CONTRAST:  1041mISOVUE-300 IOPAMIDOL (ISOVUE-300) INJECTION 61% COMPARISON:  CT CAP 01/26/2018. FINDINGS: CT CHEST FINDINGS Cardiovascular: Normal heart size. Thoracic aortic vascular calcifications. Mediastinum/Nodes: No enlarged axillary, mediastinal or hilar  lymphadenopathy. Normal appearance of the esophagus. Lungs/Pleura: Central airways are patent. Dependent atelectasis within the bilateral lower lobes. Centrilobular and paraseptal emphysematous changes. Biapical pleuroparenchymal thickening/scarring. Unchanged 3.9 x 3.2 cm right apical mass (image 35; series 4), previously 3.7 x 3.2 cm. No new or enlarging pulmonary nodules or masses. No pleural effusion or pneumothorax. Musculoskeletal: Thoracic spine degenerative changes. No aggressive or acute appearing osseous lesions. CT ABDOMEN PELVIS FINDINGS Hepatobiliary: Liver is normal in size and contour. Gallbladder is unremarkable. No intrahepatic or extrahepatic biliary ductal dilatation. Pancreas: Unremarkable Spleen: Unremarkable Adrenals/Urinary Tract: Normal adrenal glands. Kidneys enhance symmetrically with contrast. No hydronephrosis. Urinary bladder is unremarkable. Stomach/Bowel: Normal morphology of the stomach. No evidence for small bowel obstruction. No free fluid or free intraperitoneal air. Postsurgical changes involving the sigmoid colon. No evidence for bowel obstruction. Vascular/Lymphatic: Normal caliber abdominal aorta. Peripheral calcified atherosclerotic plaque. No retroperitoneal lymphadenopathy. Reproductive: Status post hysterectomy. Other: Anterior abdominal wall postsurgical changes. Unchanged small fat containing anterior abdominal wall hernias. Musculoskeletal: Lumbar spine degenerative changes. No aggressive or acute appearing osseous lesions. IMPRESSION: 1. Stable right apical mass. 2. No evidence for new or progressive disease in the chest, abdomen or pelvis. 3. Aortic Atherosclerosis (ICD10-I70.0) and Emphysema (ICD10-J43.9). Electronically Signed   By: DrLovey Newcomer.D.   On: 03/30/2018 11:12    ASSESSMENT AND PLAN:  This is a very pleasant [age]ears old white female with metastatic non-small cell lung cancer, adenocarcinoma status post induction systemic chemotherapy with carboplatin  and Alimta with partial response. The patient is currently on maintenance treatment with single agent Alimta status post 65 cycles. The patient continues to tolerate this treatment well with no concerning complaints. She had repeat CT scan of the chest, abdomen and pelvis performed recently. I personally and independently reviewed the scans and discussed the results with the patient and her husband.  Her scan showed no concerning findings for disease progression. I recommended for the patient to continue her current treatment with maintenance Alimta and she will proceed with cycle #66 today. For anxiety she was given refill of Xanax. The patient was advised to call immediately if she has any concerning symptoms in the interval. The patient voices understanding of current disease status and treatment options and is in agreement with the current care plan. All questions were answered. The patient knows to call the clinic with any problems, questions or concerns. We can certainly see the patient much sooner if necessary.  Disclaimer: This note was dictated with voice recognition software. Similar sounding words can inadvertently be transcribed and may not be corrected upon review.

## 2018-04-02 NOTE — Patient Instructions (Signed)
[facility] Discharge Instructions for Patients Receiving Chemotherapy  Today you received the following chemotherapy agents Alimta  To help prevent nausea and vomiting after your treatment, we encourage you to take your nausea medication as directed   If you develop nausea and vomiting that is not controlled by your nausea medication, call the clinic.   BELOW ARE SYMPTOMS THAT SHOULD BE REPORTED IMMEDIATELY:  *FEVER GREATER THAN 100.5 F  *CHILLS WITH OR WITHOUT FEVER  NAUSEA AND VOMITING THAT IS NOT CONTROLLED WITH YOUR NAUSEA MEDICATION  *UNUSUAL SHORTNESS OF BREATH  *UNUSUAL BRUISING OR BLEEDING  TENDERNESS IN MOUTH AND THROAT WITH OR WITHOUT PRESENCE OF ULCERS  *URINARY PROBLEMS  *BOWEL PROBLEMS  UNUSUAL RASH Items with * indicate a potential emergency and should be followed up as soon as possible.  Feel free to call the clinic should you have any questions or concerns. The clinic phone number is [phone].  Please show the [facility] at check-in to the Emergency Department and triage nurse.

## 2018-04-02 NOTE — Telephone Encounter (Signed)
Appointments scheduled AVS/Calendar printed per 8/5 los

## 2018-04-03 NOTE — Progress Notes (Signed)
PA for Ondansetron 8 mg tabs has been submitted.

## 2018-04-13 ENCOUNTER — Other Ambulatory Visit: Payer: Self-pay | Admitting: Internal Medicine

## 2018-04-13 DIAGNOSIS — C3411 Malignant neoplasm of upper lobe, right bronchus or lung: Secondary | ICD-10-CM

## 2018-04-13 DIAGNOSIS — C7949 Secondary malignant neoplasm of other parts of nervous system: Secondary | ICD-10-CM

## 2018-04-13 DIAGNOSIS — C7931 Secondary malignant neoplasm of brain: Secondary | ICD-10-CM

## 2018-04-23 ENCOUNTER — Inpatient Hospital Stay (HOSPITAL_BASED_OUTPATIENT_CLINIC_OR_DEPARTMENT_OTHER): Payer: Medicare Other | Admitting: Internal Medicine

## 2018-04-23 ENCOUNTER — Inpatient Hospital Stay: Payer: Medicare Other

## 2018-04-23 ENCOUNTER — Encounter: Payer: Self-pay | Admitting: Internal Medicine

## 2018-04-23 ENCOUNTER — Telehealth: Payer: Self-pay | Admitting: Internal Medicine

## 2018-04-23 ENCOUNTER — Encounter: Payer: Self-pay | Admitting: *Deleted

## 2018-04-23 VITALS — BP 135/73 | HR 102 | Temp 97.6°F | Resp 18 | Ht 64.0 in | Wt 185.3 lb

## 2018-04-23 VITALS — HR 91

## 2018-04-23 DIAGNOSIS — M7989 Other specified soft tissue disorders: Secondary | ICD-10-CM

## 2018-04-23 DIAGNOSIS — Z9221 Personal history of antineoplastic chemotherapy: Secondary | ICD-10-CM

## 2018-04-23 DIAGNOSIS — C3411 Malignant neoplasm of upper lobe, right bronchus or lung: Secondary | ICD-10-CM

## 2018-04-23 DIAGNOSIS — Z79899 Other long term (current) drug therapy: Secondary | ICD-10-CM | POA: Diagnosis not present

## 2018-04-23 DIAGNOSIS — C7931 Secondary malignant neoplasm of brain: Secondary | ICD-10-CM

## 2018-04-23 DIAGNOSIS — Z923 Personal history of irradiation: Secondary | ICD-10-CM

## 2018-04-23 DIAGNOSIS — Z5111 Encounter for antineoplastic chemotherapy: Secondary | ICD-10-CM | POA: Diagnosis not present

## 2018-04-23 DIAGNOSIS — C7949 Secondary malignant neoplasm of other parts of nervous system: Secondary | ICD-10-CM

## 2018-04-23 LAB — COMPREHENSIVE METABOLIC PANEL
ALT: 11 U/L (ref 0–44)
AST: 12 U/L — ABNORMAL LOW (ref 15–41)
Albumin: 3.4 g/dL — ABNORMAL LOW (ref 3.5–5.0)
Alkaline Phosphatase: 115 U/L (ref 38–126)
Anion gap: 9 (ref 5–15)
BUN: 12 mg/dL (ref 8–23)
CO2: 23 mmol/L (ref 22–32)
Calcium: 9.5 mg/dL (ref 8.9–10.3)
Chloride: 109 mmol/L (ref 98–111)
Creatinine, Ser: 0.79 mg/dL (ref 0.44–1.00)
GFR calc Af Amer: >60 mL/min (ref >60)
GFR calc non Af Amer: >60 mL/min (ref >60)
Glucose, Bld: 169 mg/dL — ABNORMAL HIGH (ref 70–99)
Potassium: 4 mmol/L (ref 3.5–5.1)
Sodium: 141 mmol/L (ref 135–145)
Total Bilirubin: 0.4 mg/dL (ref 0.3–1.2)
Total Protein: 6.9 g/dL (ref 6.5–8.1)

## 2018-04-23 LAB — CBC WITH DIFFERENTIAL/PLATELET
Basophils Absolute: 0 10*3/uL (ref 0.0–0.1)
Basophils Relative: 0 %
Eosinophils Absolute: 0 10*3/uL (ref 0.0–0.5)
Eosinophils Relative: 0 %
HCT: 35.7 % (ref 34.8–46.6)
Hemoglobin: 12 g/dL (ref 11.6–15.9)
Lymphocytes Relative: 4 %
Lymphs Abs: 0.3 10*3/uL — ABNORMAL LOW (ref 0.9–3.3)
MCH: 32.8 pg (ref 25.1–34.0)
MCHC: 33.6 g/dL (ref 31.5–36.0)
MCV: 97.5 fL (ref 79.5–101.0)
Monocytes Absolute: 0.3 10*3/uL (ref 0.1–0.9)
Monocytes Relative: 5 %
Neutro Abs: 6.3 10*3/uL (ref 1.5–6.5)
Neutrophils Relative %: 91 %
Platelets: 286 10*3/uL (ref 145–400)
RBC: 3.66 MIL/uL — ABNORMAL LOW (ref 3.70–5.45)
RDW: 15.9 % — ABNORMAL HIGH (ref 11.2–14.5)
WBC: 6.9 10*3/uL (ref 3.9–10.3)

## 2018-04-23 MED ORDER — ONDANSETRON HCL 8 MG PO TABS: 8.0000 mg | ORAL_TABLET | Freq: Once | ORAL | Status: DC

## 2018-04-23 MED ORDER — SODIUM CHLORIDE 0.9 % IV SOLN
500.0000 mg/m2 | Freq: Once | INTRAVENOUS | Status: AC
  Administered 2018-04-23: 900 mg via INTRAVENOUS
  Filled 2018-04-23: qty 20

## 2018-04-23 MED ORDER — DEXAMETHASONE SODIUM PHOSPHATE 10 MG/ML IJ SOLN
INTRAMUSCULAR | Status: AC
  Filled 2018-04-23: qty 1

## 2018-04-23 MED ORDER — DEXAMETHASONE SODIUM PHOSPHATE 10 MG/ML IJ SOLN
10.0000 mg | Freq: Once | INTRAMUSCULAR | Status: AC
  Administered 2018-04-23: 10 mg via INTRAVENOUS

## 2018-04-23 MED ORDER — SODIUM CHLORIDE 0.9 % IV SOLN
Freq: Once | INTRAVENOUS | Status: AC
  Administered 2018-04-23: 10:00:00 via INTRAVENOUS
  Filled 2018-04-23: qty 250

## 2018-04-23 MED ORDER — CYANOCOBALAMIN 1000 MCG/ML IJ SOLN
1000.0000 ug | Freq: Once | INTRAMUSCULAR | Status: AC
  Administered 2018-04-23: 1000 ug via INTRAMUSCULAR

## 2018-04-23 MED ORDER — CYANOCOBALAMIN 1000 MCG/ML IJ SOLN
INTRAMUSCULAR | Status: AC
Start: 2018-04-23 — End: ?
  Filled 2018-04-23: qty 1

## 2018-04-23 NOTE — Telephone Encounter (Signed)
Next 3 cycles already scheduled per 8/26 los.

## 2018-04-23 NOTE — Progress Notes (Signed)
[place] [phone]   [phone]  OFFICE PROGRESS NOTE  [name], MD [address] [place] [address]  DIAGNOSIS: Stage IV (T2a, N0, M1b) non-small cell lung cancer, adenocarcinoma with negative EGFR and ALK mutations diagnosed in January 2015 and presented with right upper lobe lung mass in addition to a solitary brain metastasis.  PRIOR THERAPY: 1) Status post stereotactic radiotherapy to a solitary right parietal brain lesion under the care of Dr. [name] on 10/16/2013. 2) Status post palliative radiotherapy to the right lung tumor under the care of Dr. [name] completed on 12/05/2013. 3) Systemic chemotherapy with carboplatin for AUC of 5 and Alimta 500 mg/M2 every 3 weeks. First dose Jan 06 2014. Status post 6 cycles.  CURRENT THERAPY: Systemic chemotherapy with maintenance Alimta 500 MG/M2 every 3 weeks, status post 67 cycles.  INTERVAL HISTORY: Alexis [name] 67 y.o. female returns to the clinic today for follow-up visit accompanied by her husband.  The patient is feeling fine today with no concerning complaints except for mild swelling of the lower extremities after traveling back from [place].  She denied having any pain in the legs.  She denied having any chest pain, shortness breath, cough or hemoptysis.  She has no fever or chills.  She has no nausea, vomiting, diarrhea or constipation.  She is here today for evaluation before starting cycle #68.   MEDICAL HISTORY: Past Medical History:  Diagnosis Date  . Anxiety   . Anxiety 06/20/2016  . Cervical cancer ([place])   . Encounter for antineoplastic chemotherapy 07/20/2015  . 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  MEDICATIONS:  Current Outpatient Medications  Medication Sig Dispense Refill  . acetaminophen (TYLENOL) 500 MG tablet Take 500 mg by mouth every 6  (six) hours as needed for mild pain or headache. Reported on 11/02/2015    . ALPRAZolam (XANAX) 0.25 MG tablet Take 1 tablet (0.25 mg total) by mouth 3 (three) times daily as needed. for anxiety 45 tablet 0  . Ascorbic Acid (VITAMIN C GUMMIE PO) Take 1 each by mouth every morning.    . benzonatate (TESSALON) 100 MG capsule Take 1 capsule (100 mg total) by mouth every 8 (eight) hours. (Patient not taking: Reported on 03/12/2018) 21 capsule 0  . dexamethasone (DECADRON) 4 MG tablet TAKE 1 TABLET BY MOUTH TWICE A DAY THE DAY BEFORE, DAY OF AND DAY AFTER THE CHEMOTHERAPY EVERY 3 WKS 40 tablet 1  . folic acid (FOLVITE) 1 MG tablet TAKE 1 TABLET BY MOUTH EVERY DAY 90 tablet 0  . Multiple Vitamin (MULTIVITAMIN WITH MINERALS) TABS tablet Take 1 tablet by mouth every morning.    Marland Kitchen omeprazole (PRILOSEC) 20 MG capsule Take 1 capsule (20 mg total) by mouth daily. As needed for reflux or indigestion. 42 capsule PRN  . ondansetron (ZOFRAN) 8 MG tablet Take one tab po before chemo. 30 tablet 0  . OVER THE COUNTER MEDICATION Take 1 tablet by mouth every morning. (Vitamin A)    . pentoxifylline (TRENTAL) 400 MG CR tablet TAKE 1 TABLET BY MOUTH TWICE A DAY (TAKE WITH VITAMIN E 400MG TWICE A DAY)  3  . prochlorperazine (COMPAZINE) 10 MG tablet Take 1 tablet (10 mg total) by mouth every 6 (six) hours as needed for nausea or vomiting. (Patient not taking: Reported on  04/02/2018) 60 tablet 0  . senna-docusate (SENOKOT-S) 8.6-50 MG tablet Take 1 tablet by mouth daily. 30 tablet prn   No current facility-administered medications for this visit.     SURGICAL HISTORY:  Past Surgical History:  Procedure Laterality Date  . ABDOMINAL HYSTERECTOMY    . COLOSTOMY TAKEDOWN N/A 07/10/2014   Procedure: LAPAROSCOPIC LYSIS OF ADHESIONS (90 MIN) LAPAROSCOPIC ASSISTED COLOSTOMY CLOSURE, RIGID PROCTOSIGMOIDOSCOPY;  Surgeon: Jackolyn Confer, MD;  Location: [facility];  Service: General;  Laterality: N/A;  . LAPAROTOMY N/A 11/03/2013    Procedure: EXPLORATORY LAPAROTOMY, DRAINAGE OF INTRA  ABDOMINAL ABSCESSES, MOBILIZATION OF SPLENIC FLEXURE, SIGMOID COLECTOMY WITH COLOSTOMY;  Surgeon: Odis Hollingshead, MD;  Location: [facility];  Service: General;  Laterality: N/A;  . VIDEO BRONCHOSCOPY Bilateral 08/30/2013   Procedure: VIDEO BRONCHOSCOPY WITH FLUORO;  Surgeon: Tanda Rockers, MD;  Location: [facility];  Service: Cardiopulmonary;  Laterality: Bilateral;    REVIEW OF SYSTEMS:  A comprehensive review of systems was negative.   PHYSICAL EXAMINATION: General appearance: alert, cooperative and no distress Head: Normocephalic, without obvious abnormality, atraumatic Neck: no adenopathy, no JVD, supple, symmetrical, trachea midline and thyroid not enlarged, symmetric, no tenderness/mass/nodules Lymph nodes: Cervical, supraclavicular, and axillary nodes normal. Resp: clear to auscultation bilaterally Back: symmetric, no curvature. ROM normal. No CVA tenderness. Cardio: regular rate and rhythm, S1, S2 normal, no murmur, click, rub or gallop GI: soft, non-tender; bowel sounds normal; no masses,  no organomegaly Extremities: extremities normal, atraumatic, no cyanosis or edema  ECOG PERFORMANCE STATUS: 0 - Asymptomatic  Blood pressure 135/73, pulse (!) 102, temperature 97.6 F (36.4 C), temperature source Oral, resp. rate 18, height 5' 4"  (1.626 m), weight 185 lb 4.8 oz (84.1 kg), SpO2 99 %.  LABORATORY DATA: Lab Results  Component Value Date   WBC 10.7 (H) 04/02/2018   HGB 12.6 04/02/2018   HCT 37.5 04/02/2018   MCV 97.7 04/02/2018   PLT 278 04/02/2018      Chemistry      Component Value Date/Time   NA 140 04/02/2018 0830   NA 139 08/14/2017 0837   K 4.5 04/02/2018 0830   K 4.4 08/14/2017 0837   CL 107 04/02/2018 0830   CO2 23 04/02/2018 0830   CO2 24 08/14/2017 0837   BUN 13 04/02/2018 0830   BUN 13.3 08/14/2017 0837   CREATININE 0.85 04/02/2018 0830   CREATININE 0.8 08/14/2017 0837      Component Value Date/Time    CALCIUM 9.7 04/02/2018 0830   CALCIUM 9.3 08/14/2017 0837   ALKPHOS 115 04/02/2018 0830   ALKPHOS 107 08/14/2017 0837   AST 12 (L) 04/02/2018 0830   AST 12 08/14/2017 0837   ALT 14 04/02/2018 0830   ALT 12 08/14/2017 0837   BILITOT 0.4 04/02/2018 0830   BILITOT 0.37 08/14/2017 0837       RADIOGRAPHIC STUDIES: Ct Chest W Contrast  Result Date: 03/30/2018 CLINICAL DATA:  Patient with history of right upper lobe carcinoma. EXAM: CT CHEST, ABDOMEN, AND PELVIS WITH CONTRAST TECHNIQUE: Multidetector CT imaging of the chest, abdomen and pelvis was performed following the standard protocol during bolus administration of intravenous contrast. CONTRAST:  151m ISOVUE-300 IOPAMIDOL (ISOVUE-300) INJECTION 61% COMPARISON:  CT CAP 01/26/2018. FINDINGS: CT CHEST FINDINGS Cardiovascular: Normal heart size. Thoracic aortic vascular calcifications. Mediastinum/Nodes: No enlarged axillary, mediastinal or hilar lymphadenopathy. Normal appearance of the esophagus. Lungs/Pleura: Central airways are patent. Dependent atelectasis within the bilateral lower lobes. Centrilobular and paraseptal emphysematous changes. Biapical pleuroparenchymal thickening/scarring. Unchanged 3.9 x  3.2 cm right apical mass (image 35; series 4), previously 3.7 x 3.2 cm. No new or enlarging pulmonary nodules or masses. No pleural effusion or pneumothorax. Musculoskeletal: Thoracic spine degenerative changes. No aggressive or acute appearing osseous lesions. CT ABDOMEN PELVIS FINDINGS Hepatobiliary: Liver is normal in size and contour. Gallbladder is unremarkable. No intrahepatic or extrahepatic biliary ductal dilatation. Pancreas: Unremarkable Spleen: Unremarkable Adrenals/Urinary Tract: Normal adrenal glands. Kidneys enhance symmetrically with contrast. No hydronephrosis. Urinary bladder is unremarkable. Stomach/Bowel: Normal morphology of the stomach. No evidence for small bowel obstruction. No free fluid or free intraperitoneal air.  Postsurgical changes involving the sigmoid colon. No evidence for bowel obstruction. Vascular/Lymphatic: Normal caliber abdominal aorta. Peripheral calcified atherosclerotic plaque. No retroperitoneal lymphadenopathy. Reproductive: Status post hysterectomy. Other: Anterior abdominal wall postsurgical changes. Unchanged small fat containing anterior abdominal wall hernias. Musculoskeletal: Lumbar spine degenerative changes. No aggressive or acute appearing osseous lesions. IMPRESSION: 1. Stable right apical mass. 2. No evidence for new or progressive disease in the chest, abdomen or pelvis. 3. Aortic Atherosclerosis (ICD10-I70.0) and Emphysema (ICD10-J43.9). Electronically Signed   By: Lovey Newcomer M.D.   On: 03/30/2018 11:12   Ct Abdomen Pelvis W Contrast  Result Date: 03/30/2018 CLINICAL DATA:  Patient with history of right upper lobe carcinoma. EXAM: CT CHEST, ABDOMEN, AND PELVIS WITH CONTRAST TECHNIQUE: Multidetector CT imaging of the chest, abdomen and pelvis was performed following the standard protocol during bolus administration of intravenous contrast. CONTRAST:  148m ISOVUE-300 IOPAMIDOL (ISOVUE-300) INJECTION 61% COMPARISON:  CT CAP 01/26/2018. FINDINGS: CT CHEST FINDINGS Cardiovascular: Normal heart size. Thoracic aortic vascular calcifications. Mediastinum/Nodes: No enlarged axillary, mediastinal or hilar lymphadenopathy. Normal appearance of the esophagus. Lungs/Pleura: Central airways are patent. Dependent atelectasis within the bilateral lower lobes. Centrilobular and paraseptal emphysematous changes. Biapical pleuroparenchymal thickening/scarring. Unchanged 3.9 x 3.2 cm right apical mass (image 35; series 4), previously 3.7 x 3.2 cm. No new or enlarging pulmonary nodules or masses. No pleural effusion or pneumothorax. Musculoskeletal: Thoracic spine degenerative changes. No aggressive or acute appearing osseous lesions. CT ABDOMEN PELVIS FINDINGS Hepatobiliary: Liver is normal in size and contour.  Gallbladder is unremarkable. No intrahepatic or extrahepatic biliary ductal dilatation. Pancreas: Unremarkable Spleen: Unremarkable Adrenals/Urinary Tract: Normal adrenal glands. Kidneys enhance symmetrically with contrast. No hydronephrosis. Urinary bladder is unremarkable. Stomach/Bowel: Normal morphology of the stomach. No evidence for small bowel obstruction. No free fluid or free intraperitoneal air. Postsurgical changes involving the sigmoid colon. No evidence for bowel obstruction. Vascular/Lymphatic: Normal caliber abdominal aorta. Peripheral calcified atherosclerotic plaque. No retroperitoneal lymphadenopathy. Reproductive: Status post hysterectomy. Other: Anterior abdominal wall postsurgical changes. Unchanged small fat containing anterior abdominal wall hernias. Musculoskeletal: Lumbar spine degenerative changes. No aggressive or acute appearing osseous lesions. IMPRESSION: 1. Stable right apical mass. 2. No evidence for new or progressive disease in the chest, abdomen or pelvis. 3. Aortic Atherosclerosis (ICD10-I70.0) and Emphysema (ICD10-J43.9). Electronically Signed   By: DLovey NewcomerM.D.   On: 03/30/2018 11:12    ASSESSMENT AND PLAN:  This is a very pleasant 67years old white female with metastatic non-small cell lung cancer, adenocarcinoma status post induction systemic chemotherapy with carboplatin and Alimta with partial response. The patient is currently on maintenance treatment with single agent Alimta status post 66 cycles. The patient is feeling fine today with no concerning complaints.  She continues to tolerate her maintenance treatment well. I recommended for the patient to proceed with cycle #67 today as scheduled. I will see her back for follow-up visit in 3 weeks for evaluation before  starting cycle #68. For the swelling of the lower extremities, she will continue to elevate her legs and decrease her salt intake.  If there is any worsening of her swelling, will consider her for  ultrasound Doppler of the lower extremities to rule out deep venous thrombosis. The patient was advised to call immediately if she has any concerning symptoms in the interval. The patient voices understanding of current disease status and treatment options and is in agreement with the current care plan. All questions were answered. The patient knows to call the clinic with any problems, questions or concerns. We can certainly see the patient much sooner if necessary.  Disclaimer: This note was dictated with voice recognition software. Similar sounding words can inadvertently be transcribed and may not be corrected upon review.

## 2018-04-23 NOTE — Progress Notes (Signed)
Oncology Nurse Navigator Documentation  Oncology Nurse Navigator Flowsheets 04/23/2018  Navigator Location [facility]  Navigator Encounter Type Clinic/MDC/I spoke with patient today during treatment.  No barriers identified at this time.  Support offered.   Patient Visit Type MedOnc  Treatment Phase Treatment  Interventions Other  Acuity Level 1  Time Spent with Patient 30

## 2018-04-23 NOTE — Patient Instructions (Signed)
[facility] Discharge Instructions for Patients Receiving Chemotherapy  Today you received the following chemotherapy agents Alimta  To help prevent nausea and vomiting after your treatment, we encourage you to take your nausea medication as directed   If you develop nausea and vomiting that is not controlled by your nausea medication, call the clinic.   BELOW ARE SYMPTOMS THAT SHOULD BE REPORTED IMMEDIATELY:  *FEVER GREATER THAN 100.5 F  *CHILLS WITH OR WITHOUT FEVER  NAUSEA AND VOMITING THAT IS NOT CONTROLLED WITH YOUR NAUSEA MEDICATION  *UNUSUAL SHORTNESS OF BREATH  *UNUSUAL BRUISING OR BLEEDING  TENDERNESS IN MOUTH AND THROAT WITH OR WITHOUT PRESENCE OF ULCERS  *URINARY PROBLEMS  *BOWEL PROBLEMS  UNUSUAL RASH Items with * indicate a potential emergency and should be followed up as soon as possible.  Feel free to call the clinic should you have any questions or concerns. The clinic phone number is [phone].  Please show the [facility] at check-in to the Emergency Department and triage nurse.

## 2018-05-13 ENCOUNTER — Other Ambulatory Visit: Payer: Self-pay | Admitting: Internal Medicine

## 2018-05-13 DIAGNOSIS — C3490 Malignant neoplasm of unspecified part of unspecified bronchus or lung: Secondary | ICD-10-CM

## 2018-05-14 ENCOUNTER — Other Ambulatory Visit: Payer: Self-pay

## 2018-05-14 ENCOUNTER — Ambulatory Visit: Payer: Medicare Other | Admitting: Internal Medicine

## 2018-05-14 ENCOUNTER — Inpatient Hospital Stay (HOSPITAL_BASED_OUTPATIENT_CLINIC_OR_DEPARTMENT_OTHER): Payer: Medicare Other | Admitting: Oncology

## 2018-05-14 ENCOUNTER — Other Ambulatory Visit: Payer: Medicare Other

## 2018-05-14 ENCOUNTER — Ambulatory Visit: Payer: Medicare Other

## 2018-05-14 ENCOUNTER — Inpatient Hospital Stay: Payer: Medicare Other | Attending: Internal Medicine

## 2018-05-14 ENCOUNTER — Inpatient Hospital Stay: Payer: Medicare Other

## 2018-05-14 ENCOUNTER — Encounter: Payer: Self-pay | Admitting: Oncology

## 2018-05-14 ENCOUNTER — Telehealth: Payer: Self-pay | Admitting: Oncology

## 2018-05-14 VITALS — HR 95

## 2018-05-14 VITALS — BP 136/58 | HR 106 | Temp 98.2°F | Resp 18 | Ht 64.0 in | Wt 184.9 lb

## 2018-05-14 DIAGNOSIS — C7931 Secondary malignant neoplasm of brain: Secondary | ICD-10-CM

## 2018-05-14 DIAGNOSIS — Z79899 Other long term (current) drug therapy: Secondary | ICD-10-CM | POA: Diagnosis not present

## 2018-05-14 DIAGNOSIS — M7989 Other specified soft tissue disorders: Secondary | ICD-10-CM | POA: Insufficient documentation

## 2018-05-14 DIAGNOSIS — M25562 Pain in left knee: Secondary | ICD-10-CM | POA: Diagnosis not present

## 2018-05-14 DIAGNOSIS — C3411 Malignant neoplasm of upper lobe, right bronchus or lung: Secondary | ICD-10-CM

## 2018-05-14 DIAGNOSIS — Z23 Encounter for immunization: Secondary | ICD-10-CM | POA: Insufficient documentation

## 2018-05-14 DIAGNOSIS — F411 Generalized anxiety disorder: Secondary | ICD-10-CM

## 2018-05-14 DIAGNOSIS — C7949 Secondary malignant neoplasm of other parts of nervous system: Secondary | ICD-10-CM

## 2018-05-14 DIAGNOSIS — Z5111 Encounter for antineoplastic chemotherapy: Secondary | ICD-10-CM

## 2018-05-14 DIAGNOSIS — M1712 Unilateral primary osteoarthritis, left knee: Secondary | ICD-10-CM | POA: Diagnosis not present

## 2018-05-14 LAB — COMPREHENSIVE METABOLIC PANEL
ALT: 9 U/L (ref 0–44)
AST: 11 U/L — ABNORMAL LOW (ref 15–41)
Albumin: 3.6 g/dL (ref 3.5–5.0)
Alkaline Phosphatase: 111 U/L (ref 38–126)
Anion gap: 9 (ref 5–15)
BUN: 12 mg/dL (ref 8–23)
CO2: 23 mmol/L (ref 22–32)
Calcium: 9.7 mg/dL (ref 8.9–10.3)
Chloride: 108 mmol/L (ref 98–111)
Creatinine, Ser: 0.8 mg/dL (ref 0.44–1.00)
GFR calc Af Amer: >60 mL/min (ref >60)
GFR calc non Af Amer: >60 mL/min (ref >60)
Glucose, Bld: 179 mg/dL — ABNORMAL HIGH (ref 70–99)
Potassium: 4.1 mmol/L (ref 3.5–5.1)
Sodium: 140 mmol/L (ref 135–145)
Total Bilirubin: 0.5 mg/dL (ref 0.3–1.2)
Total Protein: 7 g/dL (ref 6.5–8.1)

## 2018-05-14 LAB — CBC WITH DIFFERENTIAL/PLATELET
Basophils Absolute: 0 10*3/uL (ref 0.0–0.1)
Basophils Relative: 0 %
Eosinophils Absolute: 0 10*3/uL (ref 0.0–0.5)
Eosinophils Relative: 0 %
HCT: 38.6 % (ref 34.8–46.6)
Hemoglobin: 12.3 g/dL (ref 11.6–15.9)
Lymphocytes Relative: 4 %
Lymphs Abs: 0.4 10*3/uL — ABNORMAL LOW (ref 0.9–3.3)
MCH: 32.2 pg (ref 25.1–34.0)
MCHC: 31.9 g/dL (ref 31.5–36.0)
MCV: 101 fL (ref 79.5–101.0)
Monocytes Absolute: 0.4 10*3/uL (ref 0.1–0.9)
Monocytes Relative: 4 %
Neutro Abs: 8.4 10*3/uL — ABNORMAL HIGH (ref 1.5–6.5)
Neutrophils Relative %: 92 %
Platelets: 306 10*3/uL (ref 145–400)
RBC: 3.82 MIL/uL (ref 3.70–5.45)
RDW: 15.7 % — ABNORMAL HIGH (ref 11.2–14.5)
WBC: 9.1 10*3/uL (ref 3.9–10.3)

## 2018-05-14 MED ORDER — ALPRAZOLAM 0.25 MG PO TABS: 0.2500 mg | ORAL_TABLET | Freq: Three times a day (TID) | ORAL | 0 refills | Status: DC | PRN

## 2018-05-14 MED ORDER — INFLUENZA VAC SPLIT QUAD 0.5 ML IM SUSY
0.5000 mL | PREFILLED_SYRINGE | Freq: Once | INTRAMUSCULAR | Status: AC
  Administered 2018-05-14: 0.5 mL via INTRAMUSCULAR

## 2018-05-14 MED ORDER — DEXAMETHASONE SODIUM PHOSPHATE 10 MG/ML IJ SOLN
INTRAMUSCULAR | Status: AC
  Filled 2018-05-14: qty 1

## 2018-05-14 MED ORDER — DEXAMETHASONE SODIUM PHOSPHATE 10 MG/ML IJ SOLN
10.0000 mg | Freq: Once | INTRAMUSCULAR | Status: AC
  Administered 2018-05-14: 10 mg via INTRAVENOUS

## 2018-05-14 MED ORDER — CYANOCOBALAMIN 1000 MCG/ML IJ SOLN: 1000.0000 ug | Freq: Once | INTRAMUSCULAR | Status: DC

## 2018-05-14 MED ORDER — SODIUM CHLORIDE 0.9 % IV SOLN
Freq: Once | INTRAVENOUS | Status: AC
  Administered 2018-05-14: 10:00:00 via INTRAVENOUS
  Filled 2018-05-14: qty 250

## 2018-05-14 MED ORDER — INFLUENZA VAC SPLIT QUAD 0.5 ML IM SUSY
PREFILLED_SYRINGE | INTRAMUSCULAR | Status: AC
  Filled 2018-05-14: qty 0.5

## 2018-05-14 MED ORDER — SODIUM CHLORIDE 0.9 % IV SOLN
500.0000 mg/m2 | Freq: Once | INTRAVENOUS | Status: AC
  Administered 2018-05-14: 900 mg via INTRAVENOUS
  Filled 2018-05-14: qty 20

## 2018-05-14 NOTE — Patient Instructions (Signed)
[facility] Discharge Instructions for Patients Receiving Chemotherapy  Today you received the following chemotherapy agents Alimta   To help prevent nausea and vomiting after your treatment, we encourage you to take your nausea medication as directed.    If you develop nausea and vomiting that is not controlled by your nausea medication, call the clinic.   BELOW ARE SYMPTOMS THAT SHOULD BE REPORTED IMMEDIATELY:  *FEVER GREATER THAN 100.5 F  *CHILLS WITH OR WITHOUT FEVER  NAUSEA AND VOMITING THAT IS NOT CONTROLLED WITH YOUR NAUSEA MEDICATION  *UNUSUAL SHORTNESS OF BREATH  *UNUSUAL BRUISING OR BLEEDING  TENDERNESS IN MOUTH AND THROAT WITH OR WITHOUT PRESENCE OF ULCERS  *URINARY PROBLEMS  *BOWEL PROBLEMS  UNUSUAL RASH Items with * indicate a potential emergency and should be followed up as soon as possible.  Feel free to call the clinic should you have any questions or concerns. The clinic phone number is [phone].  Please show the [name] at check-in to the Emergency Department and triage nurse.

## 2018-05-14 NOTE — Progress Notes (Signed)
[name] OFFICE PROGRESS NOTE  [name], MD [address] [place] [address]  DIAGNOSIS: Stage IV (T2a, N0, M1b) non-small cell lung cancer, adenocarcinoma with negative EGFR and ALK mutations diagnosed in January 2015 and presented with right upper lobe lung mass in addition to a solitary brain metastasis.  PRIOR THERAPY: 1) Status post stereotactic radiotherapy to a solitary right parietal brain lesion under the care of Dr. [name] on 10/16/2013. 2) Status post palliative radiotherapy to the right lung tumor under the care of Dr. [name] completed on 12/05/2013. 3) Systemic chemotherapy with carboplatin for AUC of 5 and Alimta 500 mg/M2 every 3 weeks. First dose Jan 06 2014. Status post 6 cycles.  CURRENT THERAPY: Systemic chemotherapy with maintenance Alimta 500 MG/M2 every 3 weeks, status post 68 cycles.  INTERVAL HISTORY: Alexis [name] 67 y.o. female returns for routine follow-up visit accompanied by her husband.  The patient is feeling fine and has no specific complaints except for mild lower extremity edema.  She reports that this has improved since her last visit.  She denies fevers and chills.  Denies chest pain, shortness of breath, cough, hemoptysis.  Denies nausea, vomiting, constipation, diarrhea.  Denies recent weight loss or night sweats.  The patient is here for evaluation prior to starting cycle #69 of her treatment.  MEDICAL HISTORY: Past Medical History:  Diagnosis Date  . Anxiety   . Anxiety 06/20/2016  . Cervical cancer ([place])   . Encounter for antineoplastic chemotherapy 07/20/2015  . 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  MEDICATIONS:  Current Outpatient Medications  Medication Sig Dispense Refill  . acetaminophen (TYLENOL) 500 MG tablet Take 500 mg by mouth every 6 (six) hours as needed for mild pain or headache. Reported on  11/02/2015    . ALPRAZolam (XANAX) 0.25 MG tablet Take 1 tablet (0.25 mg total) by mouth 3 (three) times daily as needed. for anxiety 45 tablet 0  . Ascorbic Acid (VITAMIN C GUMMIE PO) Take 1 each by mouth every morning.    . benzonatate (TESSALON) 100 MG capsule Take 1 capsule (100 mg total) by mouth every 8 (eight) hours. 21 capsule 0  . dexamethasone (DECADRON) 4 MG tablet TAKE 1 TABLET BY MOUTH TWICE A DAY THE DAY BEFORE, DAY OF AND DAY AFTER THE CHEMOTHERAPY EVERY 3 WKS 40 tablet 1  . folic acid (FOLVITE) 1 MG tablet TAKE 1 TABLET BY MOUTH EVERY DAY 90 tablet 0  . Multiple Vitamin (MULTIVITAMIN WITH MINERALS) TABS tablet Take 1 tablet by mouth every morning.    Marland Kitchen omeprazole (PRILOSEC) 20 MG capsule Take 1 capsule (20 mg total) by mouth daily. As needed for reflux or indigestion. 42 capsule PRN  . ondansetron (ZOFRAN) 8 MG tablet Take one tab po before chemo. 30 tablet 0  . OVER THE COUNTER MEDICATION Take 1 tablet by mouth every morning. (Vitamin A)    . pentoxifylline (TRENTAL) 400 MG CR tablet TAKE 1 TABLET BY MOUTH TWICE A DAY (TAKE WITH VITAMIN E 400MG TWICE A DAY)  3  . prochlorperazine (COMPAZINE) 10 MG tablet Take 1 tablet (10 mg total) by mouth every 6 (six) hours as needed for nausea or vomiting. 60 tablet 0  . senna-docusate (SENOKOT-S) 8.6-50 MG tablet Take 1 tablet by mouth daily. 30 tablet prn   No current facility-administered medications for  this visit.    Facility-Administered Medications Ordered in Other Visits  Medication Dose Route Frequency Provider Last Rate Last Dose  . dexamethasone (DECADRON) injection 10 mg  10 mg Intravenous Once Curt Bears, MD      . PEMEtrexed (ALIMTA) 900 mg in sodium chloride 0.9 % 100 mL chemo infusion  500 mg/m2 (Treatment Plan Recorded) Intravenous Once Curt Bears, MD        SURGICAL HISTORY:  Past Surgical History:  Procedure Laterality Date  . ABDOMINAL HYSTERECTOMY    . COLOSTOMY TAKEDOWN N/A 07/10/2014   Procedure:  LAPAROSCOPIC LYSIS OF ADHESIONS (90 MIN) LAPAROSCOPIC ASSISTED COLOSTOMY CLOSURE, RIGID PROCTOSIGMOIDOSCOPY;  Surgeon: Jackolyn Confer, MD;  Location: [facility];  Service: General;  Laterality: N/A;  . LAPAROTOMY N/A 11/03/2013   Procedure: EXPLORATORY LAPAROTOMY, DRAINAGE OF INTRA  ABDOMINAL ABSCESSES, MOBILIZATION OF SPLENIC FLEXURE, SIGMOID COLECTOMY WITH COLOSTOMY;  Surgeon: Odis Hollingshead, MD;  Location: [facility];  Service: General;  Laterality: N/A;  . VIDEO BRONCHOSCOPY Bilateral 08/30/2013   Procedure: VIDEO BRONCHOSCOPY WITH FLUORO;  Surgeon: Tanda Rockers, MD;  Location: [facility];  Service: Cardiopulmonary;  Laterality: Bilateral;    REVIEW OF SYSTEMS:   Review of Systems  Constitutional: Negative for appetite change, chills, fatigue, fever and unexpected weight change.  HENT:   Negative for mouth sores, nosebleeds, sore throat and trouble swallowing.   Eyes: Negative for eye problems and icterus.  Respiratory: Negative for cough, hemoptysis, shortness of breath and wheezing.   Cardiovascular: Negative for chest pain.  Positive for bilateral lower extremity edema which is improving. Gastrointestinal: Negative for abdominal pain, constipation, diarrhea, nausea and vomiting.  Genitourinary: Negative for bladder incontinence, difficulty urinating, dysuria, frequency and hematuria.   Musculoskeletal: Negative for back pain, gait problem, neck pain and neck stiffness.  Skin: Negative for itching and rash.  Neurological: Negative for dizziness, extremity weakness, gait problem, headaches, light-headedness and seizures.  Hematological: Negative for adenopathy. Does not bruise/bleed easily.  Psychiatric/Behavioral: Negative for confusion, depression and sleep disturbance. The patient is not nervous/anxious.     PHYSICAL EXAMINATION:  Blood pressure (!) 136/58, pulse (!) 106, temperature 98.2 F (36.8 C), temperature source Oral, resp. rate 18, height 5' 4"  (1.626 m), weight 184 lb 14.4 oz  (83.9 kg), SpO2 98 %.  ECOG PERFORMANCE STATUS: 1 - Symptomatic but completely ambulatory  Physical Exam  Constitutional: Oriented to person, place, and time and well-developed, well-nourished, and in no distress. No distress.  HENT:  Head: Normocephalic and atraumatic.  Mouth/Throat: Oropharynx is clear and moist. No oropharyngeal exudate.  Eyes: Conjunctivae are normal. Right eye exhibits no discharge. Left eye exhibits no discharge. No scleral icterus.  Neck: Normal range of motion. Neck supple.  Cardiovascular: Normal rate, regular rhythm, normal heart sounds and intact distal pulses.  Trace edema to the bilateral lower extremities. Pulmonary/Chest: Effort normal and breath sounds normal. No respiratory distress. No wheezes. No rales.  Abdominal: Soft. Bowel sounds are normal. Exhibits no distension and no mass. There is no tenderness.  Musculoskeletal: Normal range of motion.   Lymphadenopathy:    No cervical adenopathy.  Neurological: Alert and oriented to person, place, and time. Exhibits normal muscle tone. Gait normal. Coordination normal.  Skin: Skin is warm and dry. No rash noted. Not diaphoretic. No erythema. No pallor.  Psychiatric: Mood, memory and judgment normal.  Vitals reviewed.  LABORATORY DATA: Lab Results  Component Value Date   WBC 9.1 05/14/2018   HGB 12.3 05/14/2018   HCT 38.6 05/14/2018  MCV 101.0 05/14/2018   PLT 306 05/14/2018      Chemistry      Component Value Date/Time   NA 140 05/14/2018 0836   NA 139 08/14/2017 0837   K 4.1 05/14/2018 0836   K 4.4 08/14/2017 0837   CL 108 05/14/2018 0836   CO2 23 05/14/2018 0836   CO2 24 08/14/2017 0837   BUN 12 05/14/2018 0836   BUN 13.3 08/14/2017 0837   CREATININE 0.80 05/14/2018 0836   CREATININE 0.8 08/14/2017 0837      Component Value Date/Time   CALCIUM 9.7 05/14/2018 0836   CALCIUM 9.3 08/14/2017 0837   ALKPHOS 111 05/14/2018 0836   ALKPHOS 107 08/14/2017 0837   AST 11 (L) 05/14/2018 0836    AST 12 08/14/2017 0837   ALT 9 05/14/2018 0836   ALT 12 08/14/2017 0837   BILITOT 0.5 05/14/2018 0836   BILITOT 0.37 08/14/2017 0837       RADIOGRAPHIC STUDIES:  No results found.   ASSESSMENT/PLAN:  Primary cancer of right upper lobe of lung This is a very pleasant 67 year old white female with metastatic non-small cell lung cancer, adenocarcinoma status post induction systemic chemotherapy with carboplatin and Alimta with partial response. The patient is currently on maintenance treatment with single agent Alimta status post 68 cycles. The patient is feeling fine today with no concerning complaints.  She continues to tolerate her maintenance treatment well. I recommended for the patient to proceed with cycle #69 today as scheduled. I will see her back for follow-up visit in 3 weeks for evaluation before starting cycle #70. For the swelling of the lower extremities, she will continue to elevate her legs and decrease her salt intake.  If there is any worsening of her swelling, will consider her for ultrasound Doppler of the lower extremities to rule out deep venous thrombosis.  The patient was advised to call immediately if she has any concerning symptoms in the interval. The patient voices understanding of current disease status and treatment options and is in agreement with the current care plan. All questions were answered. The patient knows to call the clinic with any problems, questions or concerns. We can certainly see the patient much sooner if necessary.   No orders of the defined types were placed in this encounter.    Mikey Bussing, DNP, AGPCNP-BC, AOCNP 05/14/18

## 2018-05-14 NOTE — Telephone Encounter (Signed)
Appts already scheduled per 9/16 los. - printed calender for patient - no additional appts added.

## 2018-05-14 NOTE — Assessment & Plan Note (Addendum)
This is a very pleasant 67 year old white female with metastatic non-small cell lung cancer, adenocarcinoma status post induction systemic chemotherapy with carboplatin and Alimta with partial response. The patient is currently on maintenance treatment with single agent Alimta status post 68 cycles. The patient is feeling fine today with no concerning complaints.  She continues to tolerate her maintenance treatment well. I recommended for the patient to proceed with cycle #69 today as scheduled. I will see her back for follow-up visit in 3 weeks for evaluation before starting cycle #70. For the swelling of the lower extremities, she will continue to elevate her legs and decrease her salt intake.  If there is any worsening of her swelling, will consider her for ultrasound Doppler of the lower extremities to rule out deep venous thrombosis.  The patient was advised to call immediately if she has any concerning symptoms in the interval. The patient voices understanding of current disease status and treatment options and is in agreement with the current care plan. All questions were answered. The patient knows to call the clinic with any problems, questions or concerns. We can certainly see the patient much sooner if necessary.

## 2018-05-14 NOTE — Progress Notes (Signed)
Pt took 8 mg Zofran PO from home at 0936 am today.

## 2018-05-18 ENCOUNTER — Telehealth: Payer: Self-pay | Admitting: Internal Medicine

## 2018-05-18 NOTE — Telephone Encounter (Signed)
MM late start 10/7. Moved appointments to later time. Left message. Schedule mailed.

## 2018-05-21 ENCOUNTER — Telehealth: Payer: Self-pay | Admitting: *Deleted

## 2018-05-21 DIAGNOSIS — C3490 Malignant neoplasm of unspecified part of unspecified bronchus or lung: Secondary | ICD-10-CM

## 2018-05-21 NOTE — Telephone Encounter (Signed)
Oncology Nurse Navigator Documentation  Oncology Nurse Navigator Flowsheets 05/21/2018  Navigator Location [facility]  Navigator Encounter Type Telephone/I received a call from patient.  She is complaining of her knee hurting.  I looked in providers last note that patient may need doppler US on knee.  I spoke with [facility] provider and he can see her today.  I called and spoke with her husband.  He states that she can't get there today but can tomorrow.  I will update [facility] and scheduling  Telephone Incoming Call;Outgoing Call  Treatment Phase Treatment  Barriers/Navigation Needs Education;Coordination of Care  Education Other  Interventions Coordination of Care  Coordination of Care Appts  Acuity Level 2  Time Spent with Patient 30

## 2018-05-22 ENCOUNTER — Ambulatory Visit (HOSPITAL_COMMUNITY)
Admission: RE | Admit: 2018-05-22 | Discharge: 2018-05-22 | Disposition: A | Discharge status: Home / Self Care | Payer: Medicare Other | Source: Ambulatory Visit | Attending: Medical | Admitting: Medical

## 2018-05-22 ENCOUNTER — Inpatient Hospital Stay: Payer: Medicare Other

## 2018-05-22 ENCOUNTER — Inpatient Hospital Stay (HOSPITAL_BASED_OUTPATIENT_CLINIC_OR_DEPARTMENT_OTHER): Payer: Medicare Other | Admitting: Medical

## 2018-05-22 VITALS — BP 114/45 | HR 100 | Temp 98.9°F | Resp 18 | Ht 64.0 in | Wt 184.9 lb

## 2018-05-22 DIAGNOSIS — M25562 Pain in left knee: Secondary | ICD-10-CM | POA: Diagnosis not present

## 2018-05-22 DIAGNOSIS — Z79899 Other long term (current) drug therapy: Secondary | ICD-10-CM

## 2018-05-22 DIAGNOSIS — C7931 Secondary malignant neoplasm of brain: Secondary | ICD-10-CM | POA: Diagnosis not present

## 2018-05-22 DIAGNOSIS — M1712 Unilateral primary osteoarthritis, left knee: Secondary | ICD-10-CM

## 2018-05-22 DIAGNOSIS — Z23 Encounter for immunization: Secondary | ICD-10-CM | POA: Diagnosis not present

## 2018-05-22 DIAGNOSIS — C3411 Malignant neoplasm of upper lobe, right bronchus or lung: Secondary | ICD-10-CM

## 2018-05-22 DIAGNOSIS — Z5111 Encounter for antineoplastic chemotherapy: Secondary | ICD-10-CM | POA: Diagnosis not present

## 2018-05-22 DIAGNOSIS — M7989 Other specified soft tissue disorders: Secondary | ICD-10-CM

## 2018-05-22 LAB — COMPREHENSIVE METABOLIC PANEL
ALT: 18 U/L (ref 0–44)
AST: 14 U/L — ABNORMAL LOW (ref 15–41)
Albumin: 3.3 g/dL — ABNORMAL LOW (ref 3.5–5.0)
Alkaline Phosphatase: 112 U/L (ref 38–126)
Anion gap: 9 (ref 5–15)
BUN: 13 mg/dL (ref 8–23)
CO2: 28 mmol/L (ref 22–32)
Calcium: 9.2 mg/dL (ref 8.9–10.3)
Chloride: 102 mmol/L (ref 98–111)
Creatinine, Ser: 0.77 mg/dL (ref 0.44–1.00)
GFR calc Af Amer: >60 mL/min (ref >60)
GFR calc non Af Amer: >60 mL/min (ref >60)
Glucose, Bld: 114 mg/dL — ABNORMAL HIGH (ref 70–99)
Potassium: 4 mmol/L (ref 3.5–5.1)
Sodium: 139 mmol/L (ref 135–145)
Total Bilirubin: 0.4 mg/dL (ref 0.3–1.2)
Total Protein: 6.5 g/dL (ref 6.5–8.1)

## 2018-05-22 LAB — CBC WITH DIFFERENTIAL/PLATELET
Basophils Absolute: 0 10*3/uL (ref 0.0–0.1)
Basophils Relative: 0 %
Eosinophils Absolute: 0.1 10*3/uL (ref 0.0–0.5)
Eosinophils Relative: 4 %
HCT: 36.3 % (ref 34.8–46.6)
Hemoglobin: 11.7 g/dL (ref 11.6–15.9)
Lymphocytes Relative: 21 %
Lymphs Abs: 0.8 10*3/uL — ABNORMAL LOW (ref 0.9–3.3)
MCH: 32.2 pg (ref 25.1–34.0)
MCHC: 32.2 g/dL (ref 31.5–36.0)
MCV: 100 fL (ref 79.5–101.0)
Monocytes Absolute: 1.2 10*3/uL — ABNORMAL HIGH (ref 0.1–0.9)
Monocytes Relative: 31 %
Neutro Abs: 1.7 10*3/uL (ref 1.5–6.5)
Neutrophils Relative %: 44 %
Platelets: 235 10*3/uL (ref 145–400)
RBC: 3.63 MIL/uL — ABNORMAL LOW (ref 3.70–5.45)
RDW: 14.7 % — ABNORMAL HIGH (ref 11.2–14.5)
WBC: 3.9 10*3/uL (ref 3.9–10.3)

## 2018-05-22 MED ORDER — PREDNISONE 5 MG PO TABS: ORAL_TABLET | ORAL | 0 refills | Status: DC

## 2018-05-22 NOTE — Progress Notes (Signed)
Sympt[facility] Management Clinic Progress Note   Alexis [name] [id number] Dec 06, 1950 67 y.o.  Alexis [name] is managed by Dr. Fanny Bien. Alexis [name]  Actively treated with chemotherapy/immunotherapy: yes  Current Therapy: Alimta  Last Treated: 05/14/2018  Assessment: Plan:    Acute pain of left knee - Plan: DG Knee 3 Views Left   Left knee pain: The patient's exam today is not consistent with a DVT.  She was referred for an x-ray of the left knee that showed mild arthritic changes.  The patient was given a prescription for a prednisone taper.  Please see After Visit Summary for patient specific instructions.  Future Appointments  Date Time Provider [place]  06/04/2018 10:45 AM [facility] LAB 5 [facility] None  06/04/2018 11:30 AM Curt Bears, MD [facility] None  06/04/2018 12:30 PM [facility] INFUSION [facility] None  06/25/2018  8:00 AM [facility] LAB 5 [facility] None  06/25/2018  8:30 AM Curt Bears, MD [facility] None  06/25/2018  9:30 AM [facility] INFUSION [facility] None  07/06/2018 10:50 AM [facility] MR 3 [facility]315MRI [facility]  07/09/2018  8:15 AM [facility] LAB 2 [facility] None  07/09/2018  8:45 AM Curt Bears, MD [facility] None  07/09/2018 10:30 AM [facility] INFUSION [facility] None  07/10/2018  9:30 AM Hayden Pedro, PA-C [facility] None  07/30/2018  8:00 AM [facility] LAB 2 [facility] None  07/30/2018  8:30 AM Curt Bears, MD [facility] None  07/30/2018  9:30 AM [facility] INFUSION [facility] None  08/20/2018  8:15 AM [facility] LAB 3 [facility] None  08/20/2018  8:45 AM Curt Bears, MD [facility] None  08/20/2018 10:00 AM [facility] INFUSION [facility] None    Orders Placed This Encounter  Procedures  . DG Knee 3 Views Left       Subjective:   Patient ID:  Alexis [name] is a 67 y.o. (DOB [birth year]) female.  Chief Complaint:  Chief Complaint  Patient presents with  . Pain    left knee cap  to foot. From knee cap to foot is burning and stinging sensation.  . Edema    left knee cap    HPI Alexis [name] is a 67 year old female with a history of a stage IV (T2a, N0, M1b) non-small cell lung cancer, adenocarcinoma with negative EGFR and ALK mutations.  She was originally diagnosed in January 2015 when she presented with a right upper lobe lung mass and a solitary brain metastasis.  She was treated with stereotactic radiotherapy to the solitary right parietal brain lesion and was given palliative radiotherapy to the right lung tumor which she completed in April 2015.  She also received systemic chemotherapy with carboplatin and and Alimta.  This is first dosed on Jan 06, 2014.  She was treated with 6 cycles.  She is currently treated with systemic chemotherapy and receives maintenance Alimta every 3 weeks.  She was last treated on 05/14/2018 when she received her 69th cycle of this treatment.  She reported mild lower extremity edema at her last visit on 05/14/2018.  She called our office yesterday stating that she was having knee pain. It was discussed at that time the patient may need a Doppler ultrasound on her lower extremity for further evaluation.  She states that she has had left knee pain and swelling on and off for the last 5 weeks.  She reports a stinging and burning sensation from her kneecap to her left foot.  She denies changes in activity or trauma.  Medications: I have reviewed the patient's current  medications.  Allergies:  Allergies  Allergen Reactions  . Codeine Nausea And Vomiting    Past Medical History:  Diagnosis Date  . Anxiety   . Anxiety 06/20/2016  . Cervical cancer ([place])   . Encounter for antineoplastic chemotherapy 07/20/2015  . 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 ABDOMINAL HYSTERECTOMY    . COLOSTOMY TAKEDOWN N/A  07/10/2014   Procedure: LAPAROSCOPIC LYSIS OF ADHESIONS (90 MIN) LAPAROSCOPIC ASSISTED COLOSTOMY CLOSURE, RIGID PROCTOSIGMOIDOSCOPY;  Surgeon: Jackolyn Confer, MD;  Location: [facility];  Service: General;  Laterality: N/A;  . LAPAROTOMY N/A 11/03/2013   Procedure: EXPLORATORY LAPAROTOMY, DRAINAGE OF INTRA  ABDOMINAL ABSCESSES, MOBILIZATION OF SPLENIC FLEXURE, SIGMOID COLECTOMY WITH COLOSTOMY;  Surgeon: Odis Hollingshead, MD;  Location: [facility];  Service: General;  Laterality: N/A;  . VIDEO BRONCHOSCOPY Bilateral 08/30/2013   Procedure: VIDEO BRONCHOSCOPY WITH FLUORO;  Surgeon: Tanda Rockers, MD;  Location: [facility];  Service: Cardiopulmonary;  Laterality: Bilateral;    Family History  Problem Relation Age of Onset  . Emphysema Father        smoked  . Lung cancer Father        smoked  . Cancer Mother   . Hypertension Mother   . COPD Mother     Social History   Socioeconomic History  . Marital status: Married    Spouse name: Not on file  . Number of children: Not on file  . Years of education: Not on file  . Highest education level: Not on file  Occupational History  . 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 Financial resource strain: Not on file  . Food insecurity:    Worry: Not on file    Inability: Not on file  . Transportation needs:    Medical: Not on file    Non-medical: Not on file  Tobacco Use  . Smoking status: Former Smoker    Packs/day: 1.00    Years: 40.00    Pack years: 40.00    Types: Cigarettes    Last attempt to quit: 09/27/2013    Years since quitting: 4.6  . Smokeless tobacco: Never Used  Substance and Sexual Activity  . Alcohol use: No  . Drug use: No  . Sexual activity: Yes  Lifestyle  . Physical activity:    Days per week: Not on file    Minutes per session: Not on file  . Stress: Not on file  Relationships  . Social connections:    Talks on phone: Not on file    Gets together: Not on file    Attends religious  service: Not on file    Active member of club or organization: Not on file    Attends meetings of clubs or organizations: Not on file    Relationship status: Not on file  . Intimate partner violence:    Fear of current or ex partner: Not on file    Emotionally abused: Not on file    Physically abused: Not on file    Forced sexual activity: Not on file  Other Topics Concern  . Not on file  Social History Narrative  . Not on file    Past Medical History, Surgical history, Social history, and Family history were reviewed and updated as appropriate.   Please see review of systems for further details  on the patient's review from today.   Review of Systems:  Review of Systems  Constitutional: Negative for chills, diaphoresis and fever.  HENT: Negative for trouble swallowing and voice change.   Respiratory: Negative for cough, chest tightness, shortness of breath and wheezing.   Cardiovascular: Positive for leg swelling. Negative for chest pain and palpitations.  Gastrointestinal: Negative for abdominal pain, constipation, diarrhea, nausea and vomiting.  Musculoskeletal: Positive for arthralgias. Negative for back pain and myalgias.  Neurological: Negative for dizziness, light-headedness and headaches.    Objective:   Physical Exam:  BP (!) 114/45 (BP Location: Left Arm, Patient Position: Sitting)   Pulse 100   Temp 98.9 F (37.2 C) (Oral)   Resp 18   Ht 5' 4"  (1.626 m)   Wt 184 lb 14.4 oz (83.9 kg)   SpO2 100%   BMI 31.74 kg/m  ECOG: 0  Physical Exam  Constitutional: No distress.  HENT:  Head: Normocephalic and atraumatic.  Cardiovascular: Normal rate, regular rhythm and normal heart sounds. Exam reveals no gallop and no friction rub.  No murmur heard. Pulses:      Popliteal pulses are 0 on the right side, and 0 on the left side.       Dorsalis pedis pulses are 1+ on the right side, and 1+ on the left side.  Pulmonary/Chest: Effort normal and breath sounds normal. No  respiratory distress. She has no wheezes. She has no rales.  Musculoskeletal: She exhibits tenderness (Anterior left knee tenderness.). She exhibits no edema.  Calves are equal bilaterally at 42.5 cm at 10 cm distal to the inferior pole of the patella.  Neurological: She is alert. Coordination (The patient is ambulating with the use of a wheelchair.) abnormal.  Skin: Skin is warm and dry. No rash noted. She is not diaphoretic. No erythema.    Lab Review:     Component Value Date/Time   NA 139 05/22/2018 0939   NA 139 08/14/2017 0837   K 4.0 05/22/2018 0939   K 4.4 08/14/2017 0837   CL 102 05/22/2018 0939   CO2 28 05/22/2018 0939   CO2 24 08/14/2017 0837   GLUCOSE 114 (H) 05/22/2018 0939   GLUCOSE 114 08/14/2017 0837   BUN 13 05/22/2018 0939   BUN 13.3 08/14/2017 0837   CREATININE 0.77 05/22/2018 0939   CREATININE 0.8 08/14/2017 0837   CALCIUM 9.2 05/22/2018 0939   CALCIUM 9.3 08/14/2017 0837   PROT 6.5 05/22/2018 0939   PROT 7.1 08/14/2017 0837   ALBUMIN 3.3 (L) 05/22/2018 0939   ALBUMIN 3.5 08/14/2017 0837   AST 14 (L) 05/22/2018 0939   AST 12 08/14/2017 0837   ALT 18 05/22/2018 0939   ALT 12 08/14/2017 0837   ALKPHOS 112 05/22/2018 0939   ALKPHOS 107 08/14/2017 0837   BILITOT 0.4 05/22/2018 0939   BILITOT 0.37 08/14/2017 0837   GFRNONAA >60 05/22/2018 0939   GFRAA >60 05/22/2018 0939       Component Value Date/Time   WBC 3.9 05/22/2018 0939   RBC 3.63 (L) 05/22/2018 0939   HGB 11.7 05/22/2018 0939   HGB 11.8 08/14/2017 0837   HCT 36.3 05/22/2018 0939   HCT 35.0 08/14/2017 0837   PLT 235 05/22/2018 0939   PLT 293 08/14/2017 0837   MCV 100.0 05/22/2018 0939   MCV 98.6 08/14/2017 0837   MCH 32.2 05/22/2018 0939   MCHC 32.2 05/22/2018 0939   RDW 14.7 (H) 05/22/2018 0939   RDW 14.3 08/14/2017 0837   LYMPHSABS  0.8 (L) 05/22/2018 0939   LYMPHSABS 0.3 (L) 08/14/2017 0837   MONOABS 1.2 (H) 05/22/2018 0939   MONOABS 0.6 08/14/2017 0837   EOSABS 0.1 05/22/2018 0939    EOSABS 0.0 08/14/2017 0837   BASOSABS 0.0 05/22/2018 0939   BASOSABS 0.0 08/14/2017 0837   -------------------------------  Imaging from last 24 hours (if applicable):  Radiology interpretation: Dg Knee 3 Views Left  Result Date: 05/22/2018 CLINICAL DATA:  Left knee pain after a cross-country flight several weeks ago EXAM: LEFT KNEE - 3 VIEW COMPARISON:  None. FINDINGS: For age there is very mild degenerative joint disease involving the left knee with some loss of medial compartment joint space. No fracture is seen and no definite joint effusion is noted. IMPRESSION: No acute abnormality.  Very minimal degenerative change for age. Electronically Signed   By: Ivar Drape M.D.   On: 05/22/2018 11:51        This case was discussed with Dr. Julien Nordmann. He expressed agreement with my management of this patient.

## 2018-05-23 ENCOUNTER — Telehealth: Payer: Self-pay | Admitting: *Deleted

## 2018-05-23 NOTE — Telephone Encounter (Signed)
-----   Message from [employer]. Tanner, PA-C sent at 05/22/2018  1:03 PM EDT ----- Would you please call this patient and let her know that her x-ray should mild arthritis. I sent in a prescription for a prednisone taper. She needs to continue regular Tylenol for now. Thanks, Lucianne Lei

## 2018-05-23 NOTE — Telephone Encounter (Signed)
As noted below by Sandi Mealy, PA, I called and spoke with patient regarding her xray and medications. The xray shows mild arthritis. A prescription for Prednisone was sent to your pharmacy. Please start today and continue taking Tylenol for now. She verbalized understanding.

## 2018-05-28 ENCOUNTER — Telehealth: Payer: Self-pay | Admitting: *Deleted

## 2018-05-28 ENCOUNTER — Telehealth: Payer: Self-pay | Admitting: Medical

## 2018-05-28 DIAGNOSIS — M1712 Unilateral primary osteoarthritis, left knee: Secondary | ICD-10-CM | POA: Diagnosis not present

## 2018-05-28 NOTE — Telephone Encounter (Signed)
The patient is having worsening knee pain.  She was contacted and was given information for [facility] orthopedic's urgent care clinic.  She will call for an appointment today. Sandi Mealy, MHS, PA-C

## 2018-05-28 NOTE — Telephone Encounter (Signed)
Oncology Nurse Navigator Documentation  Oncology Nurse Navigator Flowsheets 05/28/2018  Navigator Location [facility]  Navigator Encounter Type Telephone/Ms. Eisler called with complaints of knee pain.  I updated [facility] PA and he would like her to be seen with ortho today.  He will call them and update me.  I called her back to let her know what we were working on.   Telephone Incoming Call;Outgoing Call  Treatment Phase Treatment  Barriers/Navigation Needs Education;Coordination of Care  Education Other  Interventions Coordination of Care;Education  Coordination of Care Other  Education Method Verbal  Acuity Level 2  Time Spent with Patient 55

## 2018-06-04 ENCOUNTER — Inpatient Hospital Stay: Payer: Medicare Other | Attending: Internal Medicine

## 2018-06-04 ENCOUNTER — Ambulatory Visit: Payer: Medicare Other

## 2018-06-04 ENCOUNTER — Inpatient Hospital Stay (HOSPITAL_BASED_OUTPATIENT_CLINIC_OR_DEPARTMENT_OTHER): Payer: Medicare Other | Admitting: Internal Medicine

## 2018-06-04 ENCOUNTER — Ambulatory Visit: Payer: Medicare Other | Admitting: Internal Medicine

## 2018-06-04 ENCOUNTER — Inpatient Hospital Stay: Payer: Medicare Other

## 2018-06-04 ENCOUNTER — Other Ambulatory Visit: Payer: Medicare Other

## 2018-06-04 ENCOUNTER — Encounter: Payer: Self-pay | Admitting: Internal Medicine

## 2018-06-04 VITALS — BP 104/49 | HR 71 | Temp 98.7°F | Resp 18 | Ht 64.0 in | Wt 181.4 lb

## 2018-06-04 DIAGNOSIS — M25562 Pain in left knee: Secondary | ICD-10-CM

## 2018-06-04 DIAGNOSIS — C3411 Malignant neoplasm of upper lobe, right bronchus or lung: Secondary | ICD-10-CM

## 2018-06-04 DIAGNOSIS — C7931 Secondary malignant neoplasm of brain: Secondary | ICD-10-CM | POA: Insufficient documentation

## 2018-06-04 DIAGNOSIS — F419 Anxiety disorder, unspecified: Secondary | ICD-10-CM | POA: Insufficient documentation

## 2018-06-04 DIAGNOSIS — Z5111 Encounter for antineoplastic chemotherapy: Secondary | ICD-10-CM

## 2018-06-04 DIAGNOSIS — Z79899 Other long term (current) drug therapy: Secondary | ICD-10-CM | POA: Diagnosis not present

## 2018-06-04 DIAGNOSIS — M1712 Unilateral primary osteoarthritis, left knee: Secondary | ICD-10-CM | POA: Insufficient documentation

## 2018-06-04 DIAGNOSIS — C3490 Malignant neoplasm of unspecified part of unspecified bronchus or lung: Secondary | ICD-10-CM

## 2018-06-04 LAB — COMPREHENSIVE METABOLIC PANEL
ALT: 11 U/L (ref 0–44)
AST: 9 U/L — ABNORMAL LOW (ref 15–41)
Albumin: 3.6 g/dL (ref 3.5–5.0)
Alkaline Phosphatase: 114 U/L (ref 38–126)
Anion gap: 9 (ref 5–15)
BUN: 19 mg/dL (ref 8–23)
CO2: 26 mmol/L (ref 22–32)
Calcium: 9.5 mg/dL (ref 8.9–10.3)
Chloride: 104 mmol/L (ref 98–111)
Creatinine, Ser: 0.81 mg/dL (ref 0.44–1.00)
GFR calc Af Amer: >60 mL/min (ref >60)
GFR calc non Af Amer: >60 mL/min (ref >60)
Glucose, Bld: 150 mg/dL — ABNORMAL HIGH (ref 70–99)
Potassium: 4.2 mmol/L (ref 3.5–5.1)
Sodium: 139 mmol/L (ref 135–145)
Total Bilirubin: 0.6 mg/dL (ref 0.3–1.2)
Total Protein: 7 g/dL (ref 6.5–8.1)

## 2018-06-04 LAB — CBC WITH DIFFERENTIAL/PLATELET
Basophils Absolute: 0 10*3/uL (ref 0.0–0.1)
Basophils Relative: 0 %
Eosinophils Absolute: 0 10*3/uL (ref 0.0–0.5)
Eosinophils Relative: 0 %
HCT: 38.1 % (ref 34.8–46.6)
Hemoglobin: 12.8 g/dL (ref 11.6–15.9)
Lymphocytes Relative: 3 %
Lymphs Abs: 0.3 10*3/uL — ABNORMAL LOW (ref 0.9–3.3)
MCH: 33.4 pg (ref 25.1–34.0)
MCHC: 33.5 g/dL (ref 31.5–36.0)
MCV: 99.6 fL (ref 79.5–101.0)
Monocytes Absolute: 0.3 10*3/uL (ref 0.1–0.9)
Monocytes Relative: 3 %
Neutro Abs: 8.8 10*3/uL — ABNORMAL HIGH (ref 1.5–6.5)
Neutrophils Relative %: 94 %
Platelets: 285 10*3/uL (ref 145–400)
RBC: 3.82 MIL/uL (ref 3.70–5.45)
RDW: 16.1 % — ABNORMAL HIGH (ref 11.2–14.5)
WBC: 9.4 10*3/uL (ref 3.9–10.3)

## 2018-06-04 MED ORDER — SODIUM CHLORIDE 0.9 % IV SOLN
500.0000 mg/m2 | Freq: Once | INTRAVENOUS | Status: AC
  Administered 2018-06-04: 900 mg via INTRAVENOUS
  Filled 2018-06-04: qty 16

## 2018-06-04 MED ORDER — DEXAMETHASONE SODIUM PHOSPHATE 10 MG/ML IJ SOLN
10.0000 mg | Freq: Once | INTRAMUSCULAR | Status: AC
  Administered 2018-06-04: 10 mg via INTRAVENOUS

## 2018-06-04 MED ORDER — ONDANSETRON HCL 8 MG PO TABS: 8.0000 mg | ORAL_TABLET | Freq: Once | ORAL | Status: DC

## 2018-06-04 MED ORDER — SODIUM CHLORIDE 0.9 % IV SOLN
Freq: Once | INTRAVENOUS | Status: AC
  Administered 2018-06-04: 12:00:00 via INTRAVENOUS
  Filled 2018-06-04: qty 250

## 2018-06-04 MED ORDER — DEXAMETHASONE SODIUM PHOSPHATE 10 MG/ML IJ SOLN
INTRAMUSCULAR | Status: AC
  Filled 2018-06-04: qty 1

## 2018-06-04 NOTE — Patient Instructions (Signed)
[facility] Discharge Instructions for Patients Receiving Chemotherapy  Today you received the following chemotherapy agents: Alimta.   To help prevent nausea and vomiting after your treatment, we encourage you to take your nausea medication as directed.    If you develop nausea and vomiting that is not controlled by your nausea medication, call the clinic.   BELOW ARE SYMPTOMS THAT SHOULD BE REPORTED IMMEDIATELY:  *FEVER GREATER THAN 100.5 F  *CHILLS WITH OR WITHOUT FEVER  NAUSEA AND VOMITING THAT IS NOT CONTROLLED WITH YOUR NAUSEA MEDICATION  *UNUSUAL SHORTNESS OF BREATH  *UNUSUAL BRUISING OR BLEEDING  TENDERNESS IN MOUTH AND THROAT WITH OR WITHOUT PRESENCE OF ULCERS  *URINARY PROBLEMS  *BOWEL PROBLEMS  UNUSUAL RASH Items with * indicate a potential emergency and should be followed up as soon as possible.  Feel free to call the clinic should you have any questions or concerns. The clinic phone number is [phone].  Please show the [facility] at check-in to the Emergency Department and triage nurse.

## 2018-06-04 NOTE — Progress Notes (Signed)
[facility] [phone]   [phone]  OFFICE PROGRESS NOTE  [name], MD [address] [place] [address]  DIAGNOSIS: Stage IV (T2a, N0, M1b) non-small cell lung cancer, adenocarcinoma with negative EGFR and ALK mutations diagnosed in January 2015 and presented with right upper lobe lung mass in addition to a solitary brain metastasis.  PRIOR THERAPY: 1) Status post stereotactic radiotherapy to a solitary right parietal brain lesion under the care of Dr. [name] on 10/16/2013. 2) Status post palliative radiotherapy to the right lung tumor under the care of Dr. [name] completed on 12/05/2013. 3) Systemic chemotherapy with carboplatin for AUC of 5 and Alimta 500 mg/M2 every 3 weeks. First dose Jan 06 2014. Status post 6 cycles.  CURRENT THERAPY: Systemic chemotherapy with maintenance Alimta 500 MG/M2 every 3 weeks, status post 69 cycles.  INTERVAL HISTORY: Alexis [name] 67 y.o. female returns to the clinic today for follow-up visit accompanied by her husband.  The patient is feeling fine today with no concerning complaints.  She denied having any chest pain, shortness breath, cough or hemoptysis.  She has no nausea, vomiting, diarrhea or constipation.  She was complaining of arthritis of the left knee when she was started on treatment with prednisone with no improvement.  She was seen by orthopedic surgeon and received steroid injection to the left knee with an instant improvement.  She denied having any headache or visual changes.  She has no fever or chills.  She is here today for evaluation before starting cycle #70 of her treatment.   MEDICAL HISTORY: Past Medical History:  Diagnosis Date  . Anxiety   . Anxiety 06/20/2016  . Cervical cancer ([place])   . Encounter for antineoplastic chemotherapy 07/20/2015  . 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  MEDICATIONS:  Current Outpatient Medications  Medication Sig Dispense Refill  . acetaminophen (TYLENOL) 500 MG tablet Take 500 mg by mouth every 6 (six) hours as needed for mild pain or headache. Reported on 11/02/2015    . ALPRAZolam (XANAX) 0.25 MG tablet Take 1 tablet (0.25 mg total) by mouth 3 (three) times daily as needed. for anxiety 45 tablet 0  . Ascorbic Acid (VITAMIN C GUMMIE PO) Take 1 each by mouth every morning.    . benzonatate (TESSALON) 100 MG capsule Take 1 capsule (100 mg total) by mouth every 8 (eight) hours. 21 capsule 0  . dexamethasone (DECADRON) 4 MG tablet TAKE 1 TABLET BY MOUTH TWICE A DAY THE DAY BEFORE, DAY OF AND DAY AFTER THE CHEMOTHERAPY EVERY 3 WKS 40 tablet 1  . folic acid (FOLVITE) 1 MG tablet TAKE 1 TABLET BY MOUTH EVERY DAY 90 tablet 0  . Multiple Vitamin (MULTIVITAMIN WITH MINERALS) TABS tablet Take 1 tablet by mouth every morning.    Marland Kitchen omeprazole (PRILOSEC) 20 MG capsule Take 1 capsule (20 mg total) by mouth daily. As needed for reflux or indigestion. 42 capsule PRN  . ondansetron (ZOFRAN) 8 MG tablet Take one tab po before chemo. 30 tablet 0  . OVER THE COUNTER MEDICATION Take 1 tablet by mouth every morning. (Vitamin A)    . pentoxifylline (TRENTAL) 400 MG CR tablet TAKE 1 TABLET BY MOUTH TWICE A DAY (TAKE WITH VITAMIN E 400MG TWICE A DAY)  3  . predniSONE (DELTASONE) 5 MG  tablet 6 tab x 1 day, 5 tab x 1 day, 4 tab x 1 day, 3 tab x 1 day, 2 tab x 1 day, 1 tab x 1 day, stop 21 tablet 0  . prochlorperazine (COMPAZINE) 10 MG tablet Take 1 tablet (10 mg total) by mouth every 6 (six) hours as needed for nausea or vomiting. 60 tablet 0  . senna-docusate (SENOKOT-S) 8.6-50 MG tablet Take 1 tablet by mouth daily. 30 tablet prn   No current facility-administered medications for this visit.     SURGICAL HISTORY:  Past Surgical History:  Procedure Laterality Date  . ABDOMINAL HYSTERECTOMY    . COLOSTOMY TAKEDOWN N/A 07/10/2014   Procedure:  LAPAROSCOPIC LYSIS OF ADHESIONS (90 MIN) LAPAROSCOPIC ASSISTED COLOSTOMY CLOSURE, RIGID PROCTOSIGMOIDOSCOPY;  Surgeon: Jackolyn Confer, MD;  Location: [facility];  Service: General;  Laterality: N/A;  . LAPAROTOMY N/A 11/03/2013   Procedure: EXPLORATORY LAPAROTOMY, DRAINAGE OF INTRA  ABDOMINAL ABSCESSES, MOBILIZATION OF SPLENIC FLEXURE, SIGMOID COLECTOMY WITH COLOSTOMY;  Surgeon: Odis Hollingshead, MD;  Location: [facility];  Service: General;  Laterality: N/A;  . VIDEO BRONCHOSCOPY Bilateral 08/30/2013   Procedure: VIDEO BRONCHOSCOPY WITH FLUORO;  Surgeon: Tanda Rockers, MD;  Location: [facility];  Service: Cardiopulmonary;  Laterality: Bilateral;    REVIEW OF SYSTEMS:  A comprehensive review of systems was negative.   PHYSICAL EXAMINATION: General appearance: alert, cooperative and no distress Head: Normocephalic, without obvious abnormality, atraumatic Neck: no adenopathy, no JVD, supple, symmetrical, trachea midline and thyroid not enlarged, symmetric, no tenderness/mass/nodules Lymph nodes: Cervical, supraclavicular, and axillary nodes normal. Resp: clear to auscultation bilaterally Back: symmetric, no curvature. ROM normal. No CVA tenderness. Cardio: regular rate and rhythm, S1, S2 normal, no murmur, click, rub or gallop GI: soft, non-tender; bowel sounds normal; no masses,  no organomegaly Extremities: extremities normal, atraumatic, no cyanosis or edema  ECOG PERFORMANCE STATUS: 0 - Asymptomatic  Blood pressure (!) 104/49, pulse 71, temperature 98.7 F (37.1 C), temperature source Oral, resp. rate 18, height 5' 4"  (1.626 m), weight 181 lb 6.4 oz (82.3 kg), SpO2 100 %.  LABORATORY DATA: Lab Results  Component Value Date   WBC 9.4 06/04/2018   HGB 12.8 06/04/2018   HCT 38.1 06/04/2018   MCV 99.6 06/04/2018   PLT 285 06/04/2018      Chemistry      Component Value Date/Time   NA 139 05/22/2018 0939   NA 139 08/14/2017 0837   K 4.0 05/22/2018 0939   K 4.4 08/14/2017 0837   CL 102  05/22/2018 0939   CO2 28 05/22/2018 0939   CO2 24 08/14/2017 0837   BUN 13 05/22/2018 0939   BUN 13.3 08/14/2017 0837   CREATININE 0.77 05/22/2018 0939   CREATININE 0.8 08/14/2017 0837      Component Value Date/Time   CALCIUM 9.2 05/22/2018 0939   CALCIUM 9.3 08/14/2017 0837   ALKPHOS 112 05/22/2018 0939   ALKPHOS 107 08/14/2017 0837   AST 14 (L) 05/22/2018 0939   AST 12 08/14/2017 0837   ALT 18 05/22/2018 0939   ALT 12 08/14/2017 0837   BILITOT 0.4 05/22/2018 0939   BILITOT 0.37 08/14/2017 0837       RADIOGRAPHIC STUDIES: Dg Knee 3 Views Left  Result Date: 05/22/2018 CLINICAL DATA:  Left knee pain after a cross-country flight several weeks ago EXAM: LEFT KNEE - 3 VIEW COMPARISON:  None. FINDINGS: For age there is very mild degenerative joint disease involving the left knee with some loss of medial compartment joint  space. No fracture is seen and no definite joint effusion is noted. IMPRESSION: No acute abnormality.  Very minimal degenerative change for age. Electronically Signed   By: Ivar Drape M.D.   On: 05/22/2018 11:51    ASSESSMENT AND PLAN:  This is a very pleasant 67 years old white female with metastatic non-small cell lung cancer, adenocarcinoma status post induction systemic chemotherapy with carboplatin and Alimta with partial response. The patient is currently on maintenance treatment with single agent Alimta status post 69 cycles. She continues to tolerate the treatment well with no concerning complaints. I recommended for the patient to proceed with cycle #70 today as scheduled. I will see her back for follow-up visit in 3 weeks for evaluation after repeating CT scan of the chest, abdomen and pelvis for restaging of her disease. The patient was advised to call immediately if she has any concerning symptoms in the interval. The patient voices understanding of current disease status and treatment options and is in agreement with the current care plan. All questions  were answered. The patient knows to call the clinic with any problems, questions or concerns. We can certainly see the patient much sooner if necessary.  Disclaimer: This note was dictated with voice recognition software. Similar sounding words can inadvertently be transcribed and may not be corrected upon review.

## 2018-06-21 ENCOUNTER — Encounter (HOSPITAL_COMMUNITY): Payer: Self-pay

## 2018-06-21 ENCOUNTER — Ambulatory Visit (HOSPITAL_COMMUNITY)
Admission: RE | Admit: 2018-06-21 | Discharge: 2018-06-21 | Disposition: A | Discharge status: Home / Self Care | Payer: Medicare Other | Source: Ambulatory Visit | Attending: Internal Medicine | Admitting: Internal Medicine

## 2018-06-21 DIAGNOSIS — K5732 Diverticulitis of large intestine without perforation or abscess without bleeding: Secondary | ICD-10-CM | POA: Diagnosis not present

## 2018-06-21 DIAGNOSIS — J439 Emphysema, unspecified: Secondary | ICD-10-CM | POA: Insufficient documentation

## 2018-06-21 DIAGNOSIS — C3490 Malignant neoplasm of unspecified part of unspecified bronchus or lung: Secondary | ICD-10-CM | POA: Diagnosis not present

## 2018-06-21 DIAGNOSIS — I700 Atherosclerosis of aorta: Secondary | ICD-10-CM | POA: Insufficient documentation

## 2018-06-21 MED ORDER — IOHEXOL 300 MG/ML  SOLN
100.0000 mL | Freq: Once | INTRAMUSCULAR | Status: AC | PRN
  Administered 2018-06-21: 100 mL via INTRAVENOUS

## 2018-06-25 ENCOUNTER — Inpatient Hospital Stay: Payer: Medicare Other

## 2018-06-25 ENCOUNTER — Encounter: Payer: Self-pay | Admitting: Internal Medicine

## 2018-06-25 ENCOUNTER — Telehealth: Payer: Self-pay | Admitting: Internal Medicine

## 2018-06-25 ENCOUNTER — Inpatient Hospital Stay (HOSPITAL_BASED_OUTPATIENT_CLINIC_OR_DEPARTMENT_OTHER): Payer: Medicare Other | Admitting: Internal Medicine

## 2018-06-25 ENCOUNTER — Ambulatory Visit: Payer: Medicare Other | Admitting: Nutrition

## 2018-06-25 VITALS — BP 116/58 | HR 91 | Temp 97.4°F | Resp 17 | Ht 64.0 in | Wt 184.3 lb

## 2018-06-25 DIAGNOSIS — M25562 Pain in left knee: Secondary | ICD-10-CM | POA: Diagnosis not present

## 2018-06-25 DIAGNOSIS — F419 Anxiety disorder, unspecified: Secondary | ICD-10-CM | POA: Diagnosis not present

## 2018-06-25 DIAGNOSIS — C3411 Malignant neoplasm of upper lobe, right bronchus or lung: Secondary | ICD-10-CM | POA: Diagnosis not present

## 2018-06-25 DIAGNOSIS — M1712 Unilateral primary osteoarthritis, left knee: Secondary | ICD-10-CM | POA: Diagnosis not present

## 2018-06-25 DIAGNOSIS — Z5111 Encounter for antineoplastic chemotherapy: Secondary | ICD-10-CM

## 2018-06-25 DIAGNOSIS — Z79899 Other long term (current) drug therapy: Secondary | ICD-10-CM | POA: Diagnosis not present

## 2018-06-25 DIAGNOSIS — C7931 Secondary malignant neoplasm of brain: Secondary | ICD-10-CM | POA: Diagnosis not present

## 2018-06-25 LAB — COMPREHENSIVE METABOLIC PANEL
ALT: 11 U/L (ref 0–44)
AST: 9 U/L — ABNORMAL LOW (ref 15–41)
Albumin: 3.3 g/dL — ABNORMAL LOW (ref 3.5–5.0)
Alkaline Phosphatase: 104 U/L (ref 38–126)
Anion gap: 9 (ref 5–15)
BUN: 13 mg/dL (ref 8–23)
CO2: 25 mmol/L (ref 22–32)
Calcium: 9.7 mg/dL (ref 8.9–10.3)
Chloride: 105 mmol/L (ref 98–111)
Creatinine, Ser: 0.78 mg/dL (ref 0.44–1.00)
GFR calc Af Amer: >60 mL/min (ref >60)
GFR calc non Af Amer: >60 mL/min (ref >60)
Glucose, Bld: 176 mg/dL — ABNORMAL HIGH (ref 70–99)
Potassium: 4.1 mmol/L (ref 3.5–5.1)
Sodium: 139 mmol/L (ref 135–145)
Total Bilirubin: 0.4 mg/dL (ref 0.3–1.2)
Total Protein: 6.9 g/dL (ref 6.5–8.1)

## 2018-06-25 LAB — CBC WITH DIFFERENTIAL/PLATELET
Abs Immature Granulocytes: 0.09 10*3/uL — ABNORMAL HIGH (ref 0.00–0.07)
Basophils Absolute: 0 10*3/uL (ref 0.0–0.1)
Basophils Relative: 0 %
Eosinophils Absolute: 0 10*3/uL (ref 0.0–0.5)
Eosinophils Relative: 0 %
HCT: 37.5 % (ref 36.0–46.0)
Hemoglobin: 11.9 g/dL — ABNORMAL LOW (ref 12.0–15.0)
Immature Granulocytes: 1 %
Lymphocytes Relative: 4 %
Lymphs Abs: 0.4 10*3/uL — ABNORMAL LOW (ref 0.7–4.0)
MCH: 32.2 pg (ref 26.0–34.0)
MCHC: 31.7 g/dL (ref 30.0–36.0)
MCV: 101.6 fL — ABNORMAL HIGH (ref 80.0–100.0)
Monocytes Absolute: 0.3 10*3/uL (ref 0.1–1.0)
Monocytes Relative: 3 %
Neutro Abs: 7.8 10*3/uL — ABNORMAL HIGH (ref 1.7–7.7)
Neutrophils Relative %: 92 %
Platelets: 300 10*3/uL (ref 150–400)
RBC: 3.69 MIL/uL — ABNORMAL LOW (ref 3.87–5.11)
RDW: 14.7 % (ref 11.5–15.5)
WBC: 8.6 10*3/uL (ref 4.0–10.5)
nRBC: 0 % (ref 0.0–0.2)

## 2018-06-25 MED ORDER — CYANOCOBALAMIN 1000 MCG/ML IJ SOLN
INTRAMUSCULAR | Status: AC
  Filled 2018-06-25: qty 1

## 2018-06-25 MED ORDER — DEXAMETHASONE SODIUM PHOSPHATE 10 MG/ML IJ SOLN
INTRAMUSCULAR | Status: AC
  Filled 2018-06-25: qty 1

## 2018-06-25 MED ORDER — CYANOCOBALAMIN 1000 MCG/ML IJ SOLN
1000.0000 ug | Freq: Once | INTRAMUSCULAR | Status: AC
  Administered 2018-06-25: 1000 ug via INTRAMUSCULAR

## 2018-06-25 MED ORDER — DEXAMETHASONE SODIUM PHOSPHATE 10 MG/ML IJ SOLN
10.0000 mg | Freq: Once | INTRAMUSCULAR | Status: AC
  Administered 2018-06-25: 10 mg via INTRAVENOUS

## 2018-06-25 MED ORDER — SODIUM CHLORIDE 0.9 % IV SOLN
Freq: Once | INTRAVENOUS | Status: AC
  Administered 2018-06-25: 10:00:00 via INTRAVENOUS
  Filled 2018-06-25: qty 250

## 2018-06-25 MED ORDER — SODIUM CHLORIDE 0.9 % IV SOLN
500.0000 mg/m2 | Freq: Once | INTRAVENOUS | Status: AC
  Administered 2018-06-25: 900 mg via INTRAVENOUS
  Filled 2018-06-25: qty 16

## 2018-06-25 MED ORDER — ONDANSETRON HCL 8 MG PO TABS: 8.0000 mg | ORAL_TABLET | Freq: Once | ORAL | Status: DC

## 2018-06-25 MED ORDER — ALPRAZOLAM 0.25 MG PO TABS: 0.2500 mg | ORAL_TABLET | Freq: Three times a day (TID) | ORAL | 0 refills | Status: DC | PRN

## 2018-06-25 NOTE — Progress Notes (Signed)
Nutrition f/u completed with patient during infusion. Patient receiving treatment for stage IV lung cancer. Patient has gained 13 pounds over the past year. She reports food tastes so good and it is hard to eliminate foods she enjoys. Patient was educated in the past on healthy plant based diet. Reviewed plant based diet again and encouraged patient to try to eliminate concentrated sweets. Patient appreciative of nutrition information.

## 2018-06-25 NOTE — Progress Notes (Signed)
[facility] [phone]   [phone]  OFFICE PROGRESS NOTE  [name], MD [address] [place] [address]  DIAGNOSIS: Stage IV (T2a, N0, M1b) non-small cell lung cancer, adenocarcinoma with negative EGFR and ALK mutations diagnosed in January 2015 and presented with right upper lobe lung mass in addition to a solitary brain metastasis.  PRIOR THERAPY: 1) Status post stereotactic radiotherapy to a solitary right parietal brain lesion under the care of Dr. [name] on 10/16/2013. 2) Status post palliative radiotherapy to the right lung tumor under the care of Dr. [name] completed on 12/05/2013. 3) Systemic chemotherapy with carboplatin for AUC of 5 and Alimta 500 mg/M2 every 3 weeks. First dose Jan 06 2014. Status post 6 cycles.  CURRENT THERAPY: Systemic chemotherapy with maintenance Alimta 500 MG/M2 every 3 weeks, status post 70 cycles.  INTERVAL HISTORY: Alexis [name] 67 y.o. female returns to the clinic today for follow-up visit accompanied by her husband.  The patient is feeling fine today with no concerning complaints except for knee pain and she received a steroid injection recently.  She denied having any chest pain, shortness of breath, cough or hemoptysis.  She denied having any fever or chills.  She has no nausea, vomiting, diarrhea or constipation.  She has no recent weight loss or night sweats.  She continues to tolerate her treatment with maintenance Alimta fairly well.  The patient is here today for evaluation with repeat CT scan of the chest for restaging of her disease.   MEDICAL HISTORY: Past Medical History:  Diagnosis Date  . Anxiety   . Anxiety 06/20/2016  . Cervical cancer ([place])   . Encounter for antineoplastic chemotherapy 07/20/2015  . 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  MEDICATIONS:  Current  Outpatient Medications  Medication Sig Dispense Refill  . acetaminophen (TYLENOL) 500 MG tablet Take 500 mg by mouth every 6 (six) hours as needed for mild pain or headache. Reported on 11/02/2015    . ALPRAZolam (XANAX) 0.25 MG tablet Take 1 tablet (0.25 mg total) by mouth 3 (three) times daily as needed. for anxiety 45 tablet 0  . Ascorbic Acid (VITAMIN C GUMMIE PO) Take 1 each by mouth every morning.    . benzonatate (TESSALON) 100 MG capsule Take 1 capsule (100 mg total) by mouth every 8 (eight) hours. 21 capsule 0  . dexamethasone (DECADRON) 4 MG tablet TAKE 1 TABLET BY MOUTH TWICE A DAY THE DAY BEFORE, DAY OF AND DAY AFTER THE CHEMOTHERAPY EVERY 3 WKS 40 tablet 1  . folic acid (FOLVITE) 1 MG tablet TAKE 1 TABLET BY MOUTH EVERY DAY 90 tablet 0  . Multiple Vitamin (MULTIVITAMIN WITH MINERALS) TABS tablet Take 1 tablet by mouth every morning.    Marland Kitchen omeprazole (PRILOSEC) 20 MG capsule Take 1 capsule (20 mg total) by mouth daily. As needed for reflux or indigestion. 42 capsule PRN  . ondansetron (ZOFRAN) 8 MG tablet Take one tab po before chemo. 30 tablet 0  . OVER THE COUNTER MEDICATION Take 1 tablet by mouth every morning. (Vitamin A)    . pentoxifylline (TRENTAL) 400 MG CR tablet TAKE 1 TABLET BY MOUTH TWICE A DAY (TAKE WITH VITAMIN E 400MG TWICE A DAY)  3  . predniSONE (DELTASONE) 5 MG tablet 6 tab x 1 day, 5 tab  x 1 day, 4 tab x 1 day, 3 tab x 1 day, 2 tab x 1 day, 1 tab x 1 day, stop 21 tablet 0  . prochlorperazine (COMPAZINE) 10 MG tablet Take 1 tablet (10 mg total) by mouth every 6 (six) hours as needed for nausea or vomiting. 60 tablet 0  . senna-docusate (SENOKOT-S) 8.6-50 MG tablet Take 1 tablet by mouth daily. 30 tablet prn   No current facility-administered medications for this visit.     SURGICAL HISTORY:  Past Surgical History:  Procedure Laterality Date  . ABDOMINAL HYSTERECTOMY    . COLOSTOMY TAKEDOWN N/A 07/10/2014   Procedure: LAPAROSCOPIC LYSIS OF ADHESIONS (90 MIN)  LAPAROSCOPIC ASSISTED COLOSTOMY CLOSURE, RIGID PROCTOSIGMOIDOSCOPY;  Surgeon: Jackolyn Confer, MD;  Location: [facility];  Service: General;  Laterality: N/A;  . LAPAROTOMY N/A 11/03/2013   Procedure: EXPLORATORY LAPAROTOMY, DRAINAGE OF INTRA  ABDOMINAL ABSCESSES, MOBILIZATION OF SPLENIC FLEXURE, SIGMOID COLECTOMY WITH COLOSTOMY;  Surgeon: Odis Hollingshead, MD;  Location: [facility];  Service: General;  Laterality: N/A;  . VIDEO BRONCHOSCOPY Bilateral 08/30/2013   Procedure: VIDEO BRONCHOSCOPY WITH FLUORO;  Surgeon: Tanda Rockers, MD;  Location: [facility];  Service: Cardiopulmonary;  Laterality: Bilateral;    REVIEW OF SYSTEMS:  Constitutional: negative Eyes: negative Ears, nose, mouth, throat, and face: negative Respiratory: negative Cardiovascular: negative Gastrointestinal: negative Genitourinary:negative Integument/breast: negative Hematologic/lymphatic: negative Musculoskeletal:positive for arthralgias Neurological: negative Behavioral/Psych: negative Endocrine: negative Allergic/Immunologic: negative   PHYSICAL EXAMINATION: General appearance: alert, cooperative and no distress Head: Normocephalic, without obvious abnormality, atraumatic Neck: no adenopathy, no JVD, supple, symmetrical, trachea midline and thyroid not enlarged, symmetric, no tenderness/mass/nodules Lymph nodes: Cervical, supraclavicular, and axillary nodes normal. Resp: clear to auscultation bilaterally Back: symmetric, no curvature. ROM normal. No CVA tenderness. Cardio: regular rate and rhythm, S1, S2 normal, no murmur, click, rub or gallop GI: soft, non-tender; bowel sounds normal; no masses,  no organomegaly Extremities: extremities normal, atraumatic, no cyanosis or edema Neurologic: Alert and oriented X 3, normal strength and tone. Normal symmetric reflexes. Normal coordination and gait  ECOG PERFORMANCE STATUS: 0 - Asymptomatic  Blood pressure (!) 116/58, pulse 91, temperature (!) 97.4 F (36.3 C),  temperature source Oral, resp. rate 17, height _0  (1.626 m), weight 184 lb 4.8 oz (83.6 kg), SpO2 100 %.  LABORATORY DATA: Lab Results  Component Value Date   WBC 9.4 06/04/2018   HGB 12.8 06/04/2018   HCT 38.1 06/04/2018   MCV 99.6 06/04/2018   PLT 285 06/04/2018      Chemistry      Component Value Date/Time   NA 139 06/04/2018 1056   NA 139 08/14/2017 0837   K 4.2 06/04/2018 1056   K 4.4 08/14/2017 0837   CL 104 06/04/2018 1056   CO2 26 06/04/2018 1056   CO2 24 08/14/2017 0837   BUN 19 06/04/2018 1056   BUN 13.3 08/14/2017 0837   CREATININE 0.81 06/04/2018 1056   CREATININE 0.8 08/14/2017 0837      Component Value Date/Time   CALCIUM 9.5 06/04/2018 1056   CALCIUM 9.3 08/14/2017 0837   ALKPHOS 114 06/04/2018 1056   ALKPHOS 107 08/14/2017 0837   AST 9 (L) 06/04/2018 1056   AST 12 08/14/2017 0837   ALT 11 06/04/2018 1056   ALT 12 08/14/2017 0837   BILITOT 0.6 06/04/2018 1056   BILITOT 0.37 08/14/2017 0837       RADIOGRAPHIC STUDIES: Ct Chest W Contrast  Result Date: 06/21/2018 CLINICAL DATA:  Lung cancer diagnosed in 2014  with ongoing chemotherapy. Radiation therapy to brain completed. Partial hysterectomy. Colon resection for diverticulitis. Asymptomatic. EXAM: CT CHEST, ABDOMEN, AND PELVIS WITH CONTRAST TECHNIQUE: Multidetector CT imaging of the chest, abdomen and pelvis was performed following the standard protocol during bolus administration of intravenous contrast. CONTRAST:  144m OMNIPAQUE IOHEXOL 300 MG/ML  SOLN COMPARISON:  03/30/2018 FINDINGS: CT CHEST FINDINGS Cardiovascular: Advanced aortic and branch vessel atherosclerosis. Tortuous thoracic aorta. Normal heart size, without pericardial effusion. Lipomatous hypertrophy of the interatrial septum. No central pulmonary embolism, on this non-dedicated study. Mediastinum/Nodes: Subcentimeter left thyroid nodule is nonspecific. No supraclavicular adenopathy. No mediastinal or hilar adenopathy. Lungs/Pleura: No  pleural fluid. Advanced centrilobular and paraseptal emphysema. Biapical pleuroparenchymal scarring. Posterior right apical masslike consolidation measures on the order of 3.7 x 3.1 cm on image 31/4 and is similar to slightly decreased from 3.9 x 3.2 cm on the prior. 1.9 cm craniocaudal today, similar on sagittal image 77. Surrounding/adjacent septal thickening and mild ground-glass, similar. Clear left lung. Musculoskeletal: Mild osteopenia. Sclerosis involving multiple anterior left-sided ribs, similar and likely posttraumatic. No osseous destruction in the right apex. CT ABDOMEN PELVIS FINDINGS Hepatobiliary: Normal liver. Normal gallbladder, without biliary ductal dilatation. Pancreas: Normal, without mass or ductal dilatation. Spleen: Normal in size, without focal abnormality. Adrenals/Urinary Tract: Normal adrenal glands. Normal kidneys, without hydronephrosis. Normal urinary bladder. Stomach/Bowel: Normal stomach, without wall thickening. Surgical sutures in the sigmoid. Normal terminal ileum and appendix. Normal small bowel. Vascular/Lymphatic: Aortic and branch vessel atherosclerosis. No abdominopelvic adenopathy. Reproductive: Hysterectomy.  No adnexal mass. Other: No significant free fluid. Mild pelvic floor laxity. Fat containing ventral pelvic wall hernia is eccentric right and similar on image 93/2. There is more cephalad abdominal wall laxity containing fat. Musculoskeletal: No acute osseous abnormality. S-shaped thoracolumbar spine curvature. IMPRESSION: 1. Similar to slight decrease in right apical mass. 2. No thoracic adenopathy. 3. No findings of metastatic disease within the abdomen or pelvis. 4. Aortic atherosclerosis (ICD10-I70.0) and emphysema (ICD10-J43.9). Electronically Signed   By: KAbigail MiyamotoM.D.   On: 06/21/2018 16:06   Ct Abdomen Pelvis W Contrast  Result Date: 06/21/2018 CLINICAL DATA:  Lung cancer diagnosed in 2014 with ongoing chemotherapy. Radiation therapy to brain  completed. Partial hysterectomy. Colon resection for diverticulitis. Asymptomatic. EXAM: CT CHEST, ABDOMEN, AND PELVIS WITH CONTRAST TECHNIQUE: Multidetector CT imaging of the chest, abdomen and pelvis was performed following the standard protocol during bolus administration of intravenous contrast. CONTRAST:  1057mOMNIPAQUE IOHEXOL 300 MG/ML  SOLN COMPARISON:  03/30/2018 FINDINGS: CT CHEST FINDINGS Cardiovascular: Advanced aortic and branch vessel atherosclerosis. Tortuous thoracic aorta. Normal heart size, without pericardial effusion. Lipomatous hypertrophy of the interatrial septum. No central pulmonary embolism, on this non-dedicated study. Mediastinum/Nodes: Subcentimeter left thyroid nodule is nonspecific. No supraclavicular adenopathy. No mediastinal or hilar adenopathy. Lungs/Pleura: No pleural fluid. Advanced centrilobular and paraseptal emphysema. Biapical pleuroparenchymal scarring. Posterior right apical masslike consolidation measures on the order of 3.7 x 3.1 cm on image 31/4 and is similar to slightly decreased from 3.9 x 3.2 cm on the prior. 1.9 cm craniocaudal today, similar on sagittal image 77. Surrounding/adjacent septal thickening and mild ground-glass, similar. Clear left lung. Musculoskeletal: Mild osteopenia. Sclerosis involving multiple anterior left-sided ribs, similar and likely posttraumatic. No osseous destruction in the right apex. CT ABDOMEN PELVIS FINDINGS Hepatobiliary: Normal liver. Normal gallbladder, without biliary ductal dilatation. Pancreas: Normal, without mass or ductal dilatation. Spleen: Normal in size, without focal abnormality. Adrenals/Urinary Tract: Normal adrenal glands. Normal kidneys, without hydronephrosis. Normal urinary bladder. Stomach/Bowel: Normal stomach, without  wall thickening. Surgical sutures in the sigmoid. Normal terminal ileum and appendix. Normal small bowel. Vascular/Lymphatic: Aortic and branch vessel atherosclerosis. No abdominopelvic adenopathy.  Reproductive: Hysterectomy.  No adnexal mass. Other: No significant free fluid. Mild pelvic floor laxity. Fat containing ventral pelvic wall hernia is eccentric right and similar on image 93/2. There is more cephalad abdominal wall laxity containing fat. Musculoskeletal: No acute osseous abnormality. S-shaped thoracolumbar spine curvature. IMPRESSION: 1. Similar to slight decrease in right apical mass. 2. No thoracic adenopathy. 3. No findings of metastatic disease within the abdomen or pelvis. 4. Aortic atherosclerosis (ICD10-I70.0) and emphysema (ICD10-J43.9). Electronically Signed   By: Abigail Miyamoto M.D.   On: 06/21/2018 16:06    ASSESSMENT AND PLAN:  This is a very pleasant 67 years old white female with metastatic non-small cell lung cancer, adenocarcinoma status post induction systemic chemotherapy with carboplatin and Alimta with partial response. The patient is currently on maintenance treatment with single agent Alimta status post 70 cycles. She continues to tolerate her maintenance treatment with Alimta fairly well.  She denied having any concerning complaints today except for the arthralgia. The patient had repeat CT scan of the chest, abdomen and pelvis performed recently.  I personally and independently reviewed the scans and discussed the results with the patient and her husband.  Her scan showed no concerning findings for disease progression. I recommended for the patient to continue her current maintenance treatment with single agent Alimta and she will proceed with cycle #71 today. The patient was given refill for Xanax for anxiety. She will come back for follow-up visit in 3 weeks for evaluation before starting cycle #72. The patient was advised to call immediately if she has any concerning symptoms in the interval. The patient voices understanding of current disease status and treatment options and is in agreement with the current care plan. All questions were answered. The patient  knows to call the clinic with any problems, questions or concerns. We can certainly see the patient much sooner if necessary.  Disclaimer: This note was dictated with voice recognition software. Similar sounding words can inadvertently be transcribed and may not be corrected upon review.

## 2018-06-25 NOTE — Telephone Encounter (Signed)
3 cycles already scheduled per 10/28 los - no additional appts added.

## 2018-06-25 NOTE — Patient Instructions (Signed)
Alex[facility] [name] Discharge Instructions for Patients Receiving Chemotherapy  Today you received the following chemotherapy agents :  Alimta.  To help prevent nausea and vomiting after your treatment, we encourage you to take your nausea medication as prescribed.   If you develop nausea and vomiting that is not controlled by your nausea medication, call the clinic.   BELOW ARE SYMPTOMS THAT SHOULD BE REPORTED IMMEDIATELY:  *FEVER GREATER THAN 100.5 F  *CHILLS WITH OR WITHOUT FEVER  NAUSEA AND VOMITING THAT IS NOT CONTROLLED WITH YOUR NAUSEA MEDICATION  *UNUSUAL SHORTNESS OF BREATH  *UNUSUAL BRUISING OR BLEEDING  TENDERNESS IN MOUTH AND THROAT WITH OR WITHOUT PRESENCE OF ULCERS  *URINARY PROBLEMS  *BOWEL PROBLEMS  UNUSUAL RASH Items with * indicate a potential emergency and should be followed up as soon as possible.  Feel free to call the clinic should you have any questions or concerns. The clinic phone number is [phone].  Please show the [facility] at check-in to the Emergency Department and triage nurse.

## 2018-06-26 DIAGNOSIS — M1712 Unilateral primary osteoarthritis, left knee: Secondary | ICD-10-CM | POA: Diagnosis not present

## 2018-07-04 ENCOUNTER — Other Ambulatory Visit: Payer: Self-pay | Admitting: Radiation Therapy

## 2018-07-05 ENCOUNTER — Other Ambulatory Visit: Payer: Medicare Other

## 2018-07-06 ENCOUNTER — Ambulatory Visit
Admission: RE | Admit: 2018-07-06 | Discharge: 2018-07-06 | Disposition: A | Discharge status: Home / Self Care | Payer: Medicare Other | Source: Ambulatory Visit | Attending: Radiation Oncology | Admitting: Radiation Oncology

## 2018-07-06 DIAGNOSIS — C7949 Secondary malignant neoplasm of other parts of nervous system: Principal | ICD-10-CM

## 2018-07-06 DIAGNOSIS — C7931 Secondary malignant neoplasm of brain: Secondary | ICD-10-CM

## 2018-07-06 DIAGNOSIS — G9389 Other specified disorders of brain: Secondary | ICD-10-CM | POA: Diagnosis not present

## 2018-07-06 DIAGNOSIS — Z5111 Encounter for antineoplastic chemotherapy: Secondary | ICD-10-CM | POA: Diagnosis not present

## 2018-07-06 MED ORDER — GADOBENATE DIMEGLUMINE 529 MG/ML IV SOLN
17.0000 mL | Freq: Once | INTRAVENOUS | Status: AC | PRN
  Administered 2018-07-06: 17 mL via INTRAVENOUS

## 2018-07-09 ENCOUNTER — Encounter: Payer: Self-pay | Admitting: Internal Medicine

## 2018-07-09 ENCOUNTER — Telehealth: Payer: Self-pay

## 2018-07-09 ENCOUNTER — Inpatient Hospital Stay: Payer: Medicare Other

## 2018-07-09 ENCOUNTER — Ambulatory Visit: Payer: Self-pay | Admitting: Radiation Oncology

## 2018-07-09 ENCOUNTER — Other Ambulatory Visit: Payer: Medicare Other

## 2018-07-09 ENCOUNTER — Inpatient Hospital Stay: Payer: Medicare Other | Attending: Internal Medicine | Admitting: Internal Medicine

## 2018-07-09 VITALS — BP 148/73 | HR 96 | Temp 97.5°F | Resp 18 | Ht 64.0 in | Wt 184.0 lb

## 2018-07-09 DIAGNOSIS — Z79899 Other long term (current) drug therapy: Secondary | ICD-10-CM | POA: Diagnosis not present

## 2018-07-09 DIAGNOSIS — C7931 Secondary malignant neoplasm of brain: Secondary | ICD-10-CM | POA: Insufficient documentation

## 2018-07-09 DIAGNOSIS — Z5111 Encounter for antineoplastic chemotherapy: Secondary | ICD-10-CM

## 2018-07-09 DIAGNOSIS — C3411 Malignant neoplasm of upper lobe, right bronchus or lung: Secondary | ICD-10-CM | POA: Insufficient documentation

## 2018-07-09 DIAGNOSIS — C7949 Secondary malignant neoplasm of other parts of nervous system: Secondary | ICD-10-CM

## 2018-07-09 NOTE — Progress Notes (Signed)
Alexis [name] [phone]   Fax:(336) [phone]  OFFICE PROGRESS NOTE  [name], MD [address] [place] [address]  DIAGNOSIS: Stage IV (T2a, N0, M1b) non-small cell lung cancer, adenocarcinoma with negative EGFR and ALK mutations diagnosed in January 2015 and presented with right upper lobe lung mass in addition to a solitary brain metastasis.  PRIOR THERAPY: 1) Status post stereotactic radiotherapy to a solitary right parietal brain lesion under the care of Dr. [name] on 10/16/2013. 2) Status post palliative radiotherapy to the right lung tumor under the care of Dr. [name] completed on 12/05/2013. 3) Systemic chemotherapy with carboplatin for AUC of 5 and Alimta 500 mg/M2 every 3 weeks. First dose Jan 06 2014. Status post 6 cycles.  CURRENT THERAPY: Systemic chemotherapy with maintenance Alimta 500 MG/M2 every 3 weeks, status post 71 cycles.  INTERVAL HISTORY: Alexis [name] 67 y.o. female returns to the clinic today for follow-up visit accompanied by her husband.  The patient tolerated the last cycle of her treatment fairly well with no concerning adverse effects.  She denied having any chest pain, shortness breath, cough or hemoptysis.  She denied having any fever or chills.  She has no nausea, vomiting, diarrhea or constipation.  She had MRI of the brain performed few days ago and she is here for evaluation and discussion of her results.   MEDICAL HISTORY: Past Medical History:  Diagnosis Date  . Anxiety   . Anxiety 06/20/2016  . Cervical cancer ([facility])   . Encounter for antineoplastic chemotherapy 07/20/2015  . 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  MEDICATIONS:  Current Outpatient Medications  Medication Sig Dispense Refill  . acetaminophen (TYLENOL) 500 MG tablet Take 500 mg by mouth every 6 (six) hours as needed for mild  pain or headache. Reported on 11/02/2015    . ALPRAZolam (XANAX) 0.25 MG tablet Take 1 tablet (0.25 mg total) by mouth 3 (three) times daily as needed. 30 tablet 0  . Ascorbic Acid (VITAMIN C GUMMIE PO) Take 1 each by mouth every morning.    . benzonatate (TESSALON) 100 MG capsule Take 1 capsule (100 mg total) by mouth every 8 (eight) hours. 21 capsule 0  . dexamethasone (DECADRON) 4 MG tablet TAKE 1 TABLET BY MOUTH TWICE A DAY THE DAY BEFORE, DAY OF AND DAY AFTER THE CHEMOTHERAPY EVERY 3 WKS 40 tablet 1  . folic acid (FOLVITE) 1 MG tablet TAKE 1 TABLET BY MOUTH EVERY DAY 90 tablet 0  . Multiple Vitamin (MULTIVITAMIN WITH MINERALS) TABS tablet Take 1 tablet by mouth every morning.    Marland Kitchen omeprazole (PRILOSEC) 20 MG capsule Take 1 capsule (20 mg total) by mouth daily. As needed for reflux or indigestion. 42 capsule PRN  . ondansetron (ZOFRAN) 8 MG tablet Take one tab po before chemo. 30 tablet 0  . OVER THE COUNTER MEDICATION Take 1 tablet by mouth every morning. (Vitamin A)    . pentoxifylline (TRENTAL) 400 MG CR tablet TAKE 1 TABLET BY MOUTH TWICE A DAY (TAKE WITH VITAMIN E 400MG TWICE A DAY)  3  . prochlorperazine (COMPAZINE) 10 MG tablet Take 1 tablet (10 mg total) by mouth every 6 (six) hours as needed for nausea or vomiting. 60 tablet 0  . senna-docusate (SENOKOT-S) 8.6-50 MG tablet Take 1 tablet by mouth daily. [place]  tablet prn   No current facility-administered medications for this visit.     SURGICAL HISTORY:  Past Surgical History:  Procedure Laterality Date  . ABDOMINAL HYSTERECTOMY    . COLOSTOMY TAKEDOWN N/A 07/10/2014   Procedure: LAPAROSCOPIC LYSIS OF ADHESIONS (90 MIN) LAPAROSCOPIC ASSISTED COLOSTOMY CLOSURE, RIGID PROCTOSIGMOIDOSCOPY;  Surgeon: Jackolyn Confer, MD;  Location: [facility];  Service: General;  Laterality: N/A;  . LAPAROTOMY N/A 11/03/2013   Procedure: EXPLORATORY LAPAROTOMY, DRAINAGE OF INTRA  ABDOMINAL ABSCESSES, MOBILIZATION OF SPLENIC FLEXURE, SIGMOID COLECTOMY WITH  COLOSTOMY;  Surgeon: Odis Hollingshead, MD;  Location: [facility];  Service: General;  Laterality: N/A;  . VIDEO BRONCHOSCOPY Bilateral 08/30/2013   Procedure: VIDEO BRONCHOSCOPY WITH FLUORO;  Surgeon: Tanda Rockers, MD;  Location: [facility];  Service: Cardiopulmonary;  Laterality: Bilateral;    REVIEW OF SYSTEMS:  A comprehensive review of systems was negative.   PHYSICAL EXAMINATION: General appearance: alert, cooperative and no distress Head: Normocephalic, without obvious abnormality, atraumatic Neck: no adenopathy, no JVD, supple, symmetrical, trachea midline and thyroid not enlarged, symmetric, no tenderness/mass/nodules Lymph nodes: Cervical, supraclavicular, and axillary nodes normal. Resp: clear to auscultation bilaterally Back: symmetric, no curvature. ROM normal. No CVA tenderness. Cardio: regular rate and rhythm, S1, S2 normal, no murmur, click, rub or gallop GI: soft, non-tender; bowel sounds normal; no masses,  no organomegaly Extremities: extremities normal, atraumatic, no cyanosis or edema  ECOG PERFORMANCE STATUS: 0 - Asymptomatic  Blood pressure (!) 148/73, pulse 96, temperature (!) 97.5 F (36.4 C), temperature source Oral, resp. rate 18, height 5' 4"  (1.626 m), weight 184 lb (83.5 kg), SpO2 100 %.  LABORATORY DATA: Lab Results  Component Value Date   WBC 8.6 06/25/2018   HGB 11.9 (L) 06/25/2018   HCT 37.5 06/25/2018   MCV 101.6 (H) 06/25/2018   PLT 300 06/25/2018      Chemistry      Component Value Date/Time   NA 139 06/25/2018 0821   NA 139 08/14/2017 0837   K 4.1 06/25/2018 0821   K 4.4 08/14/2017 0837   CL 105 06/25/2018 0821   CO2 25 06/25/2018 0821   CO2 24 08/14/2017 0837   BUN 13 06/25/2018 0821   BUN 13.3 08/14/2017 0837   CREATININE 0.78 06/25/2018 0821   CREATININE 0.8 08/14/2017 0837      Component Value Date/Time   CALCIUM 9.7 06/25/2018 0821   CALCIUM 9.3 08/14/2017 0837   ALKPHOS 104 06/25/2018 0821   ALKPHOS 107 08/14/2017 0837    AST 9 (L) 06/25/2018 0821   AST 12 08/14/2017 0837   ALT 11 06/25/2018 0821   ALT 12 08/14/2017 0837   BILITOT 0.4 06/25/2018 0821   BILITOT 0.37 08/14/2017 0837       RADIOGRAPHIC STUDIES: Ct Chest W Contrast  Result Date: 06/21/2018 CLINICAL DATA:  Lung cancer diagnosed in 2014 with ongoing chemotherapy. Radiation therapy to brain completed. Partial hysterectomy. Colon resection for diverticulitis. Asymptomatic. EXAM: CT CHEST, ABDOMEN, AND PELVIS WITH CONTRAST TECHNIQUE: Multidetector CT imaging of the chest, abdomen and pelvis was performed following the standard protocol during bolus administration of intravenous contrast. CONTRAST:  170m OMNIPAQUE IOHEXOL 300 MG/ML  SOLN COMPARISON:  03/30/2018 FINDINGS: CT CHEST FINDINGS Cardiovascular: Advanced aortic and branch vessel atherosclerosis. Tortuous thoracic aorta. Normal heart size, without pericardial effusion. Lipomatous hypertrophy of the interatrial septum. No central pulmonary embolism, on this non-dedicated study. Mediastinum/Nodes: Subcentimeter left thyroid nodule is nonspecific. No supraclavicular adenopathy. No mediastinal or hilar adenopathy. Lungs/Pleura: No pleural fluid. Advanced centrilobular  and paraseptal emphysema. Biapical pleuroparenchymal scarring. Posterior right apical masslike consolidation measures on the order of 3.7 x 3.1 cm on image 31/4 and is similar to slightly decreased from 3.9 x 3.2 cm on the prior. 1.9 cm craniocaudal today, similar on sagittal image 77. Surrounding/adjacent septal thickening and mild ground-glass, similar. Clear left lung. Musculoskeletal: Mild osteopenia. Sclerosis involving multiple anterior left-sided ribs, similar and likely posttraumatic. No osseous destruction in the right apex. CT ABDOMEN PELVIS FINDINGS Hepatobiliary: Normal liver. Normal gallbladder, without biliary ductal dilatation. Pancreas: Normal, without mass or ductal dilatation. Spleen: Normal in size, without focal abnormality.  Adrenals/Urinary Tract: Normal adrenal glands. Normal kidneys, without hydronephrosis. Normal urinary bladder. Stomach/Bowel: Normal stomach, without wall thickening. Surgical sutures in the sigmoid. Normal terminal ileum and appendix. Normal small bowel. Vascular/Lymphatic: Aortic and branch vessel atherosclerosis. No abdominopelvic adenopathy. Reproductive: Hysterectomy.  No adnexal mass. Other: No significant free fluid. Mild pelvic floor laxity. Fat containing ventral pelvic wall hernia is eccentric right and similar on image 93/2. There is more cephalad abdominal wall laxity containing fat. Musculoskeletal: No acute osseous abnormality. S-shaped thoracolumbar spine curvature. IMPRESSION: 1. Similar to slight decrease in right apical mass. 2. No thoracic adenopathy. 3. No findings of metastatic disease within the abdomen or pelvis. 4. Aortic atherosclerosis (ICD10-I70.0) and emphysema (ICD10-J43.9). Electronically Signed   By: Abigail Miyamoto M.D.   On: 06/21/2018 16:06   Mr [name] UX Contrast  Result Date: 07/06/2018 CLINICAL DATA:  Metastatic lung cancer treated with SRS and chemotherapy. EXAM: MRI HEAD WITHOUT AND WITH CONTRAST TECHNIQUE: Multiplanar, multiecho pulse sequences of the brain and surrounding structures were obtained without and with intravenous contrast. CONTRAST:  52m MULTIHANCE GADOBENATE DIMEGLUMINE 529 MG/ML IV SOLN COMPARISON:  MRI head 12/29/2017 FINDINGS: Brain: Enhancing lesion right parietal cortex unchanged. This has central necrosis and irregular enhancement. Mild hemorrhage is noted in the lesion. The enhancing lesion measures 16 x 10 mm and is stable from multiple prior studies. Surrounding white matter edema is stable. No new enhancing lesions in the brain. Ventricle size normal. Negative for acute infarct. Scattered patchy white matter hyperintensities bilaterally compatible with chronic ischemia. Vascular: Normal arterial flow voids Skull and upper cervical spine: Negative  Sinuses/Orbits: Mild mucosal edema paranasal cataract sinuses surgery. Bilateral none Other: IMPRESSION: Enhancing lesion right parietal lobe with surrounding edema stable from prior studies. This is most likely treatment related enhancement. No new lesions. Electronically Signed   By: CFranchot GalloM.D.   On: 07/06/2018 11:46   Ct Abdomen Pelvis W Contrast  Result Date: 06/21/2018 CLINICAL DATA:  Lung cancer diagnosed in 2014 with ongoing chemotherapy. Radiation therapy to brain completed. Partial hysterectomy. Colon resection for diverticulitis. Asymptomatic. EXAM: CT CHEST, ABDOMEN, AND PELVIS WITH CONTRAST TECHNIQUE: Multidetector CT imaging of the chest, abdomen and pelvis was performed following the standard protocol during bolus administration of intravenous contrast. CONTRAST:  1067mOMNIPAQUE IOHEXOL 300 MG/ML  SOLN COMPARISON:  03/30/2018 FINDINGS: CT CHEST FINDINGS Cardiovascular: Advanced aortic and branch vessel atherosclerosis. Tortuous thoracic aorta. Normal heart size, without pericardial effusion. Lipomatous hypertrophy of the interatrial septum. No central pulmonary embolism, on this non-dedicated study. Mediastinum/Nodes: Subcentimeter left thyroid nodule is nonspecific. No supraclavicular adenopathy. No mediastinal or hilar adenopathy. Lungs/Pleura: No pleural fluid. Advanced centrilobular and paraseptal emphysema. Biapical pleuroparenchymal scarring. Posterior right apical masslike consolidation measures on the order of 3.7 x 3.1 cm on image 31/4 and is similar to slightly decreased from 3.9 x 3.2 cm on the prior. 1.9 cm craniocaudal today, similar on  sagittal image 77. Surrounding/adjacent septal thickening and mild ground-glass, similar. Clear left lung. Musculoskeletal: Mild osteopenia. Sclerosis involving multiple anterior left-sided ribs, similar and likely posttraumatic. No osseous destruction in the right apex. CT ABDOMEN PELVIS FINDINGS Hepatobiliary: Normal liver. Normal  gallbladder, without biliary ductal dilatation. Pancreas: Normal, without mass or ductal dilatation. Spleen: Normal in size, without focal abnormality. Adrenals/Urinary Tract: Normal adrenal glands. Normal kidneys, without hydronephrosis. Normal urinary bladder. Stomach/Bowel: Normal stomach, without wall thickening. Surgical sutures in the sigmoid. Normal terminal ileum and appendix. Normal small bowel. Vascular/Lymphatic: Aortic and branch vessel atherosclerosis. No abdominopelvic adenopathy. Reproductive: Hysterectomy.  No adnexal mass. Other: No significant free fluid. Mild pelvic floor laxity. Fat containing ventral pelvic wall hernia is eccentric right and similar on image 93/2. There is more cephalad abdominal wall laxity containing fat. Musculoskeletal: No acute osseous abnormality. S-shaped thoracolumbar spine curvature. IMPRESSION: 1. Similar to slight decrease in right apical mass. 2. No thoracic adenopathy. 3. No findings of metastatic disease within the abdomen or pelvis. 4. Aortic atherosclerosis (ICD10-I70.0) and emphysema (ICD10-J43.9). Electronically Signed   By: Abigail Miyamoto M.D.   On: 06/21/2018 16:06    ASSESSMENT AND PLAN:  This is a very pleasant 67 years old white female with metastatic non-small cell lung cancer, adenocarcinoma status post induction systemic chemotherapy with carboplatin and Alimta with partial response. The patient is currently on maintenance treatment with single agent Alimta status post 71 cycles. She tolerated the last cycle of her treatment well with no concerning adverse effects. The patient is feeling fine today with no concerning complaints. I recommended for the patient to proceed with cycle #72 next week as scheduled. She will come back for follow-up visit in 4 weeks for evaluation before starting cycle #73. Her MRI of the brain showed no concerning findings for disease progression. The patient was advised to call immediately if she has any concerning  symptoms in the interval. The patient voices understanding of current disease status and treatment options and is in agreement with the current care plan. All questions were answered. The patient knows to call the clinic with any problems, questions or concerns. We can certainly see the patient much sooner if necessary.  Disclaimer: This note was dictated with voice recognition software. Similar sounding words can inadvertently be transcribed and may not be corrected upon review.

## 2018-07-09 NOTE — Telephone Encounter (Signed)
Printed avs and calender of upcoming appointment. Per 11/11 los

## 2018-07-10 ENCOUNTER — Ambulatory Visit: Payer: Medicare Other | Admitting: Radiation Oncology

## 2018-07-10 DIAGNOSIS — Z961 Presence of intraocular lens: Secondary | ICD-10-CM | POA: Diagnosis not present

## 2018-07-11 ENCOUNTER — Telehealth: Payer: Self-pay | Admitting: Radiation Oncology

## 2018-07-11 NOTE — Telephone Encounter (Signed)
I spoke with the patient to let her know the recommendations from conference were for repeat scan in 6 months and meet back to review this. She is otherwise asymptomatic neurologically and will call if she has questions or concerns.

## 2018-07-12 NOTE — Progress Notes (Signed)
Brain and Spine Tumor Board Documentation  Alexis [name] was presented by Cecil Cobbs, MD at Brain and Spine Tumor Board on 07/12/2018, which included representatives from neuro oncology, radiation oncology, surgical oncology, navigation, pathology, radiology.  Alexis [name] was presented as a current patient with history of the following treatments:  .  Additionally, we reviewed previous medical and familial history, history of present illness, and recent lab results along with all available histopathologic and imaging studies. The tumor board considered available treatment options and made the following recommendations:  Active surveillance    Tumor board is a meeting of clinicians from various specialty areas who evaluate and discuss patients for whom a multidisciplinary approach is being considered. Final determinations in the plan of care are those of the provider(s). The responsibility for follow up of recommendations given during tumor board is that of the provider.   Today's extended care, comprehensive team conference, [name] was not present for the discussion and was not examined.

## 2018-07-16 ENCOUNTER — Inpatient Hospital Stay: Payer: Medicare Other

## 2018-07-16 VITALS — BP 112/84 | HR 75 | Temp 98.0°F | Resp 17 | Ht 64.0 in | Wt 183.0 lb

## 2018-07-16 DIAGNOSIS — Z5111 Encounter for antineoplastic chemotherapy: Secondary | ICD-10-CM | POA: Diagnosis not present

## 2018-07-16 DIAGNOSIS — C3411 Malignant neoplasm of upper lobe, right bronchus or lung: Secondary | ICD-10-CM | POA: Diagnosis not present

## 2018-07-16 DIAGNOSIS — Z79899 Other long term (current) drug therapy: Secondary | ICD-10-CM | POA: Diagnosis not present

## 2018-07-16 DIAGNOSIS — C7931 Secondary malignant neoplasm of brain: Secondary | ICD-10-CM | POA: Diagnosis not present

## 2018-07-16 LAB — CBC WITH DIFFERENTIAL/PLATELET
Abs Immature Granulocytes: 0.21 10*3/uL — ABNORMAL HIGH (ref 0.00–0.07)
Basophils Absolute: 0 10*3/uL (ref 0.0–0.1)
Basophils Relative: 0 %
Eosinophils Absolute: 0 10*3/uL (ref 0.0–0.5)
Eosinophils Relative: 0 %
HCT: 37.1 % (ref 36.0–46.0)
Hemoglobin: 11.8 g/dL — ABNORMAL LOW (ref 12.0–15.0)
Immature Granulocytes: 2 %
Lymphocytes Relative: 3 %
Lymphs Abs: 0.3 10*3/uL — ABNORMAL LOW (ref 0.7–4.0)
MCH: 32.2 pg (ref 26.0–34.0)
MCHC: 31.8 g/dL (ref 30.0–36.0)
MCV: 101.4 fL — ABNORMAL HIGH (ref 80.0–100.0)
Monocytes Absolute: 0.6 10*3/uL (ref 0.1–1.0)
Monocytes Relative: 6 %
Neutro Abs: 8.9 10*3/uL — ABNORMAL HIGH (ref 1.7–7.7)
Neutrophils Relative %: 89 %
Platelets: 346 10*3/uL (ref 150–400)
RBC: 3.66 MIL/uL — ABNORMAL LOW (ref 3.87–5.11)
RDW: 14.8 % (ref 11.5–15.5)
WBC: 10.1 10*3/uL (ref 4.0–10.5)
nRBC: 0 % (ref 0.0–0.2)

## 2018-07-16 LAB — COMPREHENSIVE METABOLIC PANEL
ALT: 12 U/L (ref 0–44)
AST: 10 U/L — ABNORMAL LOW (ref 15–41)
Albumin: 3.2 g/dL — ABNORMAL LOW (ref 3.5–5.0)
Alkaline Phosphatase: 99 U/L (ref 38–126)
Anion gap: 10 (ref 5–15)
BUN: 16 mg/dL (ref 8–23)
CO2: 24 mmol/L (ref 22–32)
Calcium: 9.6 mg/dL (ref 8.9–10.3)
Chloride: 105 mmol/L (ref 98–111)
Creatinine, Ser: 0.78 mg/dL (ref 0.44–1.00)
GFR calc Af Amer: >60 mL/min (ref >60)
GFR calc non Af Amer: >60 mL/min (ref >60)
Glucose, Bld: 141 mg/dL — ABNORMAL HIGH (ref 70–99)
Potassium: 4.4 mmol/L (ref 3.5–5.1)
Sodium: 139 mmol/L (ref 135–145)
Total Bilirubin: 0.4 mg/dL (ref 0.3–1.2)
Total Protein: 6.7 g/dL (ref 6.5–8.1)

## 2018-07-16 MED ORDER — SODIUM CHLORIDE 0.9 % IV SOLN
500.0000 mg/m2 | Freq: Once | INTRAVENOUS | Status: AC
  Administered 2018-07-16: 900 mg via INTRAVENOUS
  Filled 2018-07-16: qty 20

## 2018-07-16 MED ORDER — ONDANSETRON HCL 8 MG PO TABS: 8.0000 mg | ORAL_TABLET | Freq: Once | ORAL | Status: DC

## 2018-07-16 MED ORDER — DEXAMETHASONE SODIUM PHOSPHATE 10 MG/ML IJ SOLN
10.0000 mg | Freq: Once | INTRAMUSCULAR | Status: AC
  Administered 2018-07-16: 10 mg via INTRAVENOUS

## 2018-07-16 MED ORDER — SODIUM CHLORIDE 0.9 % IV SOLN
Freq: Once | INTRAVENOUS | Status: AC
  Administered 2018-07-16: 10:00:00 via INTRAVENOUS
  Filled 2018-07-16: qty 250

## 2018-07-16 MED ORDER — DEXAMETHASONE SODIUM PHOSPHATE 10 MG/ML IJ SOLN
INTRAMUSCULAR | Status: AC
  Filled 2018-07-16: qty 1

## 2018-07-16 NOTE — Patient Instructions (Signed)
[facility] Discharge Instructions for Patients Receiving Chemotherapy  Today you received the following chemotherapy agents Pemetrexed (ALIMTA).  To help prevent nausea and vomiting after your treatment, we encourage you to take your nausea medication as prescribed.   If you develop nausea and vomiting that is not controlled by your nausea medication, call the clinic.   BELOW ARE SYMPTOMS THAT SHOULD BE REPORTED IMMEDIATELY:  *FEVER GREATER THAN 100.5 F  *CHILLS WITH OR WITHOUT FEVER  NAUSEA AND VOMITING THAT IS NOT CONTROLLED WITH YOUR NAUSEA MEDICATION  *UNUSUAL SHORTNESS OF BREATH  *UNUSUAL BRUISING OR BLEEDING  TENDERNESS IN MOUTH AND THROAT WITH OR WITHOUT PRESENCE OF ULCERS  *URINARY PROBLEMS  *BOWEL PROBLEMS  UNUSUAL RASH Items with * indicate a potential emergency and should be followed up as soon as possible.  Feel free to call the clinic should you have any questions or concerns. The clinic phone number is [phone].  Please show the [facility] at check-in to the Emergency Department and triage nurse.

## 2018-07-18 ENCOUNTER — Inpatient Hospital Stay
Admission: RE | Admit: 2018-07-18 | Discharge: 2018-07-18 | Disposition: A | Discharge status: Home / Self Care | Payer: Medicare Other | Source: Ambulatory Visit | Attending: Radiation Oncology | Admitting: Radiation Oncology

## 2018-07-30 ENCOUNTER — Ambulatory Visit: Payer: Medicare Other | Admitting: Internal Medicine

## 2018-07-30 ENCOUNTER — Other Ambulatory Visit: Payer: Medicare Other

## 2018-07-30 ENCOUNTER — Ambulatory Visit: Payer: Medicare Other

## 2018-08-06 ENCOUNTER — Encounter: Payer: Self-pay | Admitting: Oncology

## 2018-08-06 ENCOUNTER — Inpatient Hospital Stay: Payer: Medicare Other

## 2018-08-06 ENCOUNTER — Inpatient Hospital Stay: Payer: Medicare Other | Attending: Internal Medicine

## 2018-08-06 ENCOUNTER — Inpatient Hospital Stay (HOSPITAL_BASED_OUTPATIENT_CLINIC_OR_DEPARTMENT_OTHER): Payer: Medicare Other | Admitting: Oncology

## 2018-08-06 ENCOUNTER — Telehealth: Payer: Self-pay | Admitting: Oncology

## 2018-08-06 VITALS — BP 113/55 | HR 77 | Temp 98.2°F | Resp 17 | Ht 64.0 in | Wt 184.7 lb

## 2018-08-06 DIAGNOSIS — C3411 Malignant neoplasm of upper lobe, right bronchus or lung: Secondary | ICD-10-CM | POA: Diagnosis not present

## 2018-08-06 DIAGNOSIS — Z5111 Encounter for antineoplastic chemotherapy: Secondary | ICD-10-CM

## 2018-08-06 DIAGNOSIS — G4700 Insomnia, unspecified: Secondary | ICD-10-CM | POA: Diagnosis not present

## 2018-08-06 DIAGNOSIS — C7931 Secondary malignant neoplasm of brain: Secondary | ICD-10-CM | POA: Insufficient documentation

## 2018-08-06 DIAGNOSIS — Z79899 Other long term (current) drug therapy: Secondary | ICD-10-CM | POA: Insufficient documentation

## 2018-08-06 DIAGNOSIS — F419 Anxiety disorder, unspecified: Secondary | ICD-10-CM

## 2018-08-06 DIAGNOSIS — Z923 Personal history of irradiation: Secondary | ICD-10-CM | POA: Diagnosis not present

## 2018-08-06 LAB — CBC WITH DIFFERENTIAL/PLATELET
Abs Immature Granulocytes: 0.11 10*3/uL — ABNORMAL HIGH (ref 0.00–0.07)
Basophils Absolute: 0 10*3/uL (ref 0.0–0.1)
Basophils Relative: 0 %
Eosinophils Absolute: 0 10*3/uL (ref 0.0–0.5)
Eosinophils Relative: 0 %
HCT: 39.1 % (ref 36.0–46.0)
Hemoglobin: 12.1 g/dL (ref 12.0–15.0)
Immature Granulocytes: 1 %
Lymphocytes Relative: 3 %
Lymphs Abs: 0.4 10*3/uL — ABNORMAL LOW (ref 0.7–4.0)
MCH: 31.5 pg (ref 26.0–34.0)
MCHC: 30.9 g/dL (ref 30.0–36.0)
MCV: 101.8 fL — ABNORMAL HIGH (ref 80.0–100.0)
Monocytes Absolute: 0.6 10*3/uL (ref 0.1–1.0)
Monocytes Relative: 4 %
Neutro Abs: 11.7 10*3/uL — ABNORMAL HIGH (ref 1.7–7.7)
Neutrophils Relative %: 92 %
Platelets: 341 10*3/uL (ref 150–400)
RBC: 3.84 MIL/uL — ABNORMAL LOW (ref 3.87–5.11)
RDW: 14.9 % (ref 11.5–15.5)
WBC: 12.8 10*3/uL — ABNORMAL HIGH (ref 4.0–10.5)
nRBC: 0 % (ref 0.0–0.2)

## 2018-08-06 LAB — COMPREHENSIVE METABOLIC PANEL
ALT: 13 U/L (ref 0–44)
AST: 9 U/L — ABNORMAL LOW (ref 15–41)
Albumin: 3.5 g/dL (ref 3.5–5.0)
Alkaline Phosphatase: 104 U/L (ref 38–126)
Anion gap: 12 (ref 5–15)
BUN: 8 mg/dL (ref 8–23)
CO2: 23 mmol/L (ref 22–32)
Calcium: 9.5 mg/dL (ref 8.9–10.3)
Chloride: 106 mmol/L (ref 98–111)
Creatinine, Ser: 0.78 mg/dL (ref 0.44–1.00)
GFR calc Af Amer: >60 mL/min (ref >60)
GFR calc non Af Amer: >60 mL/min (ref >60)
Glucose, Bld: 177 mg/dL — ABNORMAL HIGH (ref 70–99)
Potassium: 4.1 mmol/L (ref 3.5–5.1)
Sodium: 141 mmol/L (ref 135–145)
Total Bilirubin: 0.3 mg/dL (ref 0.3–1.2)
Total Protein: 6.7 g/dL (ref 6.5–8.1)

## 2018-08-06 MED ORDER — ONDANSETRON HCL 8 MG PO TABS: 8.0000 mg | ORAL_TABLET | Freq: Once | ORAL | Status: DC

## 2018-08-06 MED ORDER — DEXAMETHASONE SODIUM PHOSPHATE 10 MG/ML IJ SOLN
INTRAMUSCULAR | Status: AC
  Filled 2018-08-06: qty 1

## 2018-08-06 MED ORDER — SODIUM CHLORIDE 0.9 % IV SOLN
Freq: Once | INTRAVENOUS | Status: AC
  Administered 2018-08-06: 14:00:00 via INTRAVENOUS
  Filled 2018-08-06: qty 250

## 2018-08-06 MED ORDER — DEXAMETHASONE SODIUM PHOSPHATE 10 MG/ML IJ SOLN
10.0000 mg | Freq: Once | INTRAMUSCULAR | Status: AC
  Administered 2018-08-06: 10 mg via INTRAVENOUS

## 2018-08-06 MED ORDER — SODIUM CHLORIDE 0.9 % IV SOLN
500.0000 mg/m2 | Freq: Once | INTRAVENOUS | Status: AC
  Administered 2018-08-06: 900 mg via INTRAVENOUS
  Filled 2018-08-06: qty 20

## 2018-08-06 MED ORDER — ALPRAZOLAM 0.25 MG PO TABS: 0.2500 mg | ORAL_TABLET | Freq: Three times a day (TID) | ORAL | 0 refills | Status: DC | PRN

## 2018-08-06 NOTE — Telephone Encounter (Signed)
Printed calendar and avs. °

## 2018-08-06 NOTE — Progress Notes (Signed)
[name] OFFICE PROGRESS NOTE  [name], MD [address] [place] [address]  DIAGNOSIS: Stage IV (T2a, N0, M1b) non-small cell lung cancer, adenocarcinoma with negative EGFR and ALK mutations diagnosed in January 2015 and presented with right upper lobe lung mass in addition to a solitary brain metastasis.  PRIOR THERAPY: 1) Status post stereotactic radiotherapy to a solitary right parietal brain lesion under the care of Dr. [name] on 10/16/2013. 2) Status post palliative radiotherapy to the right lung tumor under the care of Dr. [name] completed on 12/05/2013. 3) Systemic chemotherapy with carboplatin for AUC of 5 and Alimta 500 mg/M2 every 3 weeks. First dose Jan 06 2014. Status post 6 cycles.  CURRENT THERAPY: Systemic chemotherapy with maintenance Alimta 500 MG/M2 every 3 weeks, status post 72 cycles.  INTERVAL HISTORY: Alexis [name] 67 y.o. female returns for routine follow-up visit by herself.  The patient is feeling fine today and has no specific complaints.  She denies fevers and chills.  Denies chest pain, shortness of breath, cough, hemoptysis.  Denies nausea, vomiting, constipation, diarrhea.  Denies recent weight loss or night sweats.  She continues to tolerate treatment with Alimta fairly well.  The patient is here for evaluation prior to cycle #73 of Alimta.  MEDICAL HISTORY: Past Medical History:  Diagnosis Date  . Anxiety   . Anxiety 06/20/2016  . Cervical cancer ([place])   . Encounter for antineoplastic chemotherapy 07/20/2015  . 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  MEDICATIONS:  Current Outpatient Medications  Medication Sig Dispense Refill  . acetaminophen (TYLENOL) 500 MG tablet Take 500 mg by mouth every 6 (six) hours as needed for mild pain or headache. Reported on 11/02/2015    . ALPRAZolam (XANAX) 0.25 MG tablet Take 1  tablet (0.25 mg total) by mouth 3 (three) times daily as needed. 30 tablet 0  . Ascorbic Acid (VITAMIN C GUMMIE PO) Take 1 each by mouth every morning.    . benzonatate (TESSALON) 100 MG capsule Take 1 capsule (100 mg total) by mouth every 8 (eight) hours. 21 capsule 0  . dexamethasone (DECADRON) 4 MG tablet TAKE 1 TABLET BY MOUTH TWICE A DAY THE DAY BEFORE, DAY OF AND DAY AFTER THE CHEMOTHERAPY EVERY 3 WKS 40 tablet 1  . folic acid (FOLVITE) 1 MG tablet TAKE 1 TABLET BY MOUTH EVERY DAY 90 tablet 0  . Multiple Vitamin (MULTIVITAMIN WITH MINERALS) TABS tablet Take 1 tablet by mouth every morning.    Marland Kitchen omeprazole (PRILOSEC) 20 MG capsule Take 1 capsule (20 mg total) by mouth daily. As needed for reflux or indigestion. 42 capsule PRN  . ondansetron (ZOFRAN) 8 MG tablet Take one tab po before chemo. 30 tablet 0  . OVER THE COUNTER MEDICATION Take 1 tablet by mouth every morning. (Vitamin A)    . pentoxifylline (TRENTAL) 400 MG CR tablet TAKE 1 TABLET BY MOUTH TWICE A DAY (TAKE WITH VITAMIN E 400MG TWICE A DAY)  3  . prochlorperazine (COMPAZINE) 10 MG tablet Take 1 tablet (10 mg total) by mouth every 6 (six) hours as needed for nausea or vomiting. 60 tablet 0  . senna-docusate (SENOKOT-S) 8.6-50 MG tablet Take 1 tablet by mouth daily. 30 tablet prn   No current facility-administered medications for this visit.    Facility-Administered Medications Ordered in Other Visits  Medication Dose Route Frequency Provider Last Rate Last Dose  . ondansetron (ZOFRAN) tablet 8 mg  8 mg Oral Once Curt Bears, MD      . PEMEtrexed (ALIMTA) 900 mg in sodium chloride 0.9 % 100 mL chemo infusion  500 mg/m2 (Treatment Plan Recorded) Intravenous Once Curt Bears, MD 816 mL/hr at 08/06/18 1452 900 mg at 08/06/18 1452    SURGICAL HISTORY:  Past Surgical History:  Procedure Laterality Date  . ABDOMINAL HYSTERECTOMY    . COLOSTOMY TAKEDOWN N/A 07/10/2014   Procedure: LAPAROSCOPIC LYSIS OF ADHESIONS (90 MIN)  LAPAROSCOPIC ASSISTED COLOSTOMY CLOSURE, RIGID PROCTOSIGMOIDOSCOPY;  Surgeon: Jackolyn Confer, MD;  Location: [facility];  Service: General;  Laterality: N/A;  . LAPAROTOMY N/A 11/03/2013   Procedure: EXPLORATORY LAPAROTOMY, DRAINAGE OF INTRA  ABDOMINAL ABSCESSES, MOBILIZATION OF SPLENIC FLEXURE, SIGMOID COLECTOMY WITH COLOSTOMY;  Surgeon: Odis Hollingshead, MD;  Location: [facility];  Service: General;  Laterality: N/A;  . VIDEO BRONCHOSCOPY Bilateral 08/30/2013   Procedure: VIDEO BRONCHOSCOPY WITH FLUORO;  Surgeon: Tanda Rockers, MD;  Location: [facility];  Service: Cardiopulmonary;  Laterality: Bilateral;    REVIEW OF SYSTEMS:   Review of Systems  Constitutional: Negative for appetite change, chills, fatigue, fever and unexpected weight change.  HENT:   Negative for mouth sores, nosebleeds, sore throat and trouble swallowing.   Eyes: Negative for eye problems and icterus.  Respiratory: Negative for cough, hemoptysis, shortness of breath and wheezing.   Cardiovascular: Negative for chest pain and leg swelling.  Gastrointestinal: Negative for abdominal pain, constipation, diarrhea, nausea and vomiting.  Genitourinary: Negative for bladder incontinence, difficulty urinating, dysuria, frequency and hematuria.   Musculoskeletal: Negative for back pain, gait problem, neck pain and neck stiffness.  Skin: Negative for itching and rash.  Neurological: Negative for dizziness, extremity weakness, gait problem, headaches, light-headedness and seizures.  Hematological: Negative for adenopathy. Does not bruise/bleed easily.  Psychiatric/Behavioral: Negative for confusion, depression and sleep disturbance. The patient is not nervous/anxious.     PHYSICAL EXAMINATION:  Blood pressure (!) 113/55, pulse 77, temperature 98.2 F (36.8 C), temperature source Oral, resp. rate 17, height 5' 4"  (1.626 m), weight 184 lb 11.2 oz (83.8 kg), SpO2 100 %.  ECOG PERFORMANCE STATUS: 1 - Symptomatic but completely  ambulatory  Physical Exam  Constitutional: Oriented to person, place, and time and well-developed, well-nourished, and in no distress. No distress.  HENT:  Head: Normocephalic and atraumatic.  Mouth/Throat: Oropharynx is clear and moist. No oropharyngeal exudate.  Eyes: Conjunctivae are normal. Right eye exhibits no discharge. Left eye exhibits no discharge. No scleral icterus.  Neck: Normal range of motion. Neck supple.  Cardiovascular: Normal rate, regular rhythm, normal heart sounds and intact distal pulses.   Pulmonary/Chest: Effort normal and breath sounds normal. No respiratory distress. No wheezes. No rales.  Abdominal: Soft. Bowel sounds are normal. Exhibits no distension and no mass. There is no tenderness.  Musculoskeletal: Normal range of motion. Exhibits no edema.  Lymphadenopathy:    No cervical adenopathy.  Neurological: Alert and oriented to person, place, and time. Exhibits normal muscle tone. Gait normal. Coordination normal.  Skin: Skin is warm and dry. No rash noted. Not diaphoretic. No erythema. No pallor.  Psychiatric: Mood, memory and judgment normal.  Vitals reviewed.  LABORATORY DATA: Lab Results  Component Value Date   WBC 12.8 (H) 08/06/2018   HGB 12.1 08/06/2018   HCT 39.1 08/06/2018   MCV 101.8 (H) 08/06/2018   PLT 341 08/06/2018      Chemistry  Component Value Date/Time   NA 141 08/06/2018 1246   NA 139 08/14/2017 0837   K 4.1 08/06/2018 1246   K 4.4 08/14/2017 0837   CL 106 08/06/2018 1246   CO2 23 08/06/2018 1246   CO2 24 08/14/2017 0837   BUN 8 08/06/2018 1246   BUN 13.3 08/14/2017 0837   CREATININE 0.78 08/06/2018 1246   CREATININE 0.8 08/14/2017 0837      Component Value Date/Time   CALCIUM 9.5 08/06/2018 1246   CALCIUM 9.3 08/14/2017 0837   ALKPHOS 104 08/06/2018 1246   ALKPHOS 107 08/14/2017 0837   AST 9 (L) 08/06/2018 1246   AST 12 08/14/2017 0837   ALT 13 08/06/2018 1246   ALT 12 08/14/2017 0837   BILITOT 0.3 08/06/2018  1246   BILITOT 0.37 08/14/2017 0837       RADIOGRAPHIC STUDIES:  No results found.   ASSESSMENT/PLAN:  Primary cancer of right upper lobe of lung This is a very pleasant 67 year old white female with metastatic non-small cell lung cancer, adenocarcinoma status post induction systemic chemotherapy with carboplatin and Alimta with partial response. The patient is currently on maintenance treatment with single agent Alimta status post 72 cycles. She tolerated the last cycle of her treatment well with no concerning adverse effects. The patient is feeling fine today with no concerning complaints.  Recommend for the patient to proceed with cycle #73 of her treatment today as scheduled.  She will follow-up in 3 weeks for evaluation prior to cycle #74.  For anxiety and insomnia, I have refilled her Xanax.  The patient was advised to call immediately if she has any concerning symptoms in the interval. The patient voices understanding of current disease status and treatment options and is in agreement with the current care plan. All questions were answered. The patient knows to call the clinic with any problems, questions or concerns. We can certainly see the patient much sooner if necessary.   No orders of the defined types were placed in this encounter.    Mikey Bussing, DNP, AGPCNP-BC, AOCNP 08/06/18

## 2018-08-06 NOTE — Patient Instructions (Signed)
Alex[facility] [name] Discharge Instructions for Patients Receiving Chemotherapy  Today you received the following chemotherapy agents :  Alimta.  To help prevent nausea and vomiting after your treatment, we encourage you to take your nausea medication as prescribed.   If you develop nausea and vomiting that is not controlled by your nausea medication, call the clinic.   BELOW ARE SYMPTOMS THAT SHOULD BE REPORTED IMMEDIATELY:  *FEVER GREATER THAN 100.5 F  *CHILLS WITH OR WITHOUT FEVER  NAUSEA AND VOMITING THAT IS NOT CONTROLLED WITH YOUR NAUSEA MEDICATION  *UNUSUAL SHORTNESS OF BREATH  *UNUSUAL BRUISING OR BLEEDING  TENDERNESS IN MOUTH AND THROAT WITH OR WITHOUT PRESENCE OF ULCERS  *URINARY PROBLEMS  *BOWEL PROBLEMS  UNUSUAL RASH Items with * indicate a potential emergency and should be followed up as soon as possible.  Feel free to call the clinic should you have any questions or concerns. The clinic phone number is [phone].  Please show the [facility] at check-in to the Emergency Department and triage nurse.

## 2018-08-06 NOTE — Assessment & Plan Note (Signed)
This is a very pleasant 67 year old white female with metastatic non-small cell lung cancer, adenocarcinoma status post induction systemic chemotherapy with carboplatin and Alimta with partial response. The patient is currently on maintenance treatment with single agent Alimta status post 72 cycles. She tolerated the last cycle of her treatment well with no concerning adverse effects. The patient is feeling fine today with no concerning complaints.  Recommend for the patient to proceed with cycle #73 of her treatment today as scheduled.  She will follow-up in 3 weeks for evaluation prior to cycle #74.  For anxiety and insomnia, I have refilled her Xanax.  The patient was advised to call immediately if she has any concerning symptoms in the interval. The patient voices understanding of current disease status and treatment options and is in agreement with the current care plan. All questions were answered. The patient knows to call the clinic with any problems, questions or concerns. We can certainly see the patient much sooner if necessary.

## 2018-08-10 ENCOUNTER — Other Ambulatory Visit: Payer: Self-pay | Admitting: Internal Medicine

## 2018-08-10 DIAGNOSIS — C3490 Malignant neoplasm of unspecified part of unspecified bronchus or lung: Secondary | ICD-10-CM

## 2018-08-20 ENCOUNTER — Ambulatory Visit: Payer: Medicare Other

## 2018-08-20 ENCOUNTER — Other Ambulatory Visit: Payer: Medicare Other

## 2018-08-20 ENCOUNTER — Ambulatory Visit: Payer: Medicare Other | Admitting: Internal Medicine

## 2018-08-27 ENCOUNTER — Encounter: Payer: Self-pay | Admitting: Internal Medicine

## 2018-08-27 ENCOUNTER — Inpatient Hospital Stay (HOSPITAL_BASED_OUTPATIENT_CLINIC_OR_DEPARTMENT_OTHER): Payer: Medicare Other | Admitting: Internal Medicine

## 2018-08-27 ENCOUNTER — Inpatient Hospital Stay: Payer: Medicare Other

## 2018-08-27 ENCOUNTER — Telehealth: Payer: Self-pay | Admitting: Internal Medicine

## 2018-08-27 VITALS — HR 98

## 2018-08-27 VITALS — BP 137/68 | HR 109 | Temp 98.5°F | Resp 19 | Ht 64.0 in | Wt 186.7 lb

## 2018-08-27 DIAGNOSIS — G4700 Insomnia, unspecified: Secondary | ICD-10-CM

## 2018-08-27 DIAGNOSIS — C7931 Secondary malignant neoplasm of brain: Secondary | ICD-10-CM

## 2018-08-27 DIAGNOSIS — F419 Anxiety disorder, unspecified: Secondary | ICD-10-CM

## 2018-08-27 DIAGNOSIS — C7949 Secondary malignant neoplasm of other parts of nervous system: Secondary | ICD-10-CM

## 2018-08-27 DIAGNOSIS — Z5111 Encounter for antineoplastic chemotherapy: Secondary | ICD-10-CM

## 2018-08-27 DIAGNOSIS — C3411 Malignant neoplasm of upper lobe, right bronchus or lung: Secondary | ICD-10-CM

## 2018-08-27 DIAGNOSIS — Z923 Personal history of irradiation: Secondary | ICD-10-CM | POA: Diagnosis not present

## 2018-08-27 DIAGNOSIS — Z79899 Other long term (current) drug therapy: Secondary | ICD-10-CM

## 2018-08-27 LAB — COMPREHENSIVE METABOLIC PANEL
ALT: 13 U/L (ref 0–44)
AST: 8 U/L — ABNORMAL LOW (ref 15–41)
Albumin: 3.3 g/dL — ABNORMAL LOW (ref 3.5–5.0)
Alkaline Phosphatase: 89 U/L (ref 38–126)
Anion gap: 12 (ref 5–15)
BUN: 13 mg/dL (ref 8–23)
CO2: 21 mmol/L — ABNORMAL LOW (ref 22–32)
Calcium: 9.1 mg/dL (ref 8.9–10.3)
Chloride: 106 mmol/L (ref 98–111)
Creatinine, Ser: 0.87 mg/dL (ref 0.44–1.00)
GFR calc Af Amer: >60 mL/min (ref >60)
GFR calc non Af Amer: >60 mL/min (ref >60)
Glucose, Bld: 243 mg/dL — ABNORMAL HIGH (ref 70–99)
Potassium: 3.8 mmol/L (ref 3.5–5.1)
Sodium: 139 mmol/L (ref 135–145)
Total Bilirubin: 0.3 mg/dL (ref 0.3–1.2)
Total Protein: 6.5 g/dL (ref 6.5–8.1)

## 2018-08-27 LAB — CBC WITH DIFFERENTIAL/PLATELET
Abs Immature Granulocytes: 0.1 10*3/uL — ABNORMAL HIGH (ref 0.00–0.07)
Basophils Absolute: 0 10*3/uL (ref 0.0–0.1)
Basophils Relative: 0 %
Eosinophils Absolute: 0 10*3/uL (ref 0.0–0.5)
Eosinophils Relative: 0 %
HCT: 36.7 % (ref 36.0–46.0)
Hemoglobin: 11.5 g/dL — ABNORMAL LOW (ref 12.0–15.0)
Immature Granulocytes: 1 %
Lymphocytes Relative: 4 %
Lymphs Abs: 0.4 10*3/uL — ABNORMAL LOW (ref 0.7–4.0)
MCH: 31.9 pg (ref 26.0–34.0)
MCHC: 31.3 g/dL (ref 30.0–36.0)
MCV: 101.7 fL — ABNORMAL HIGH (ref 80.0–100.0)
Monocytes Absolute: 0.6 10*3/uL (ref 0.1–1.0)
Monocytes Relative: 5 %
Neutro Abs: 10.4 10*3/uL — ABNORMAL HIGH (ref 1.7–7.7)
Neutrophils Relative %: 90 %
Platelets: 312 10*3/uL (ref 150–400)
RBC: 3.61 MIL/uL — ABNORMAL LOW (ref 3.87–5.11)
RDW: 15.1 % (ref 11.5–15.5)
WBC: 11.6 10*3/uL — ABNORMAL HIGH (ref 4.0–10.5)
nRBC: 0 % (ref 0.0–0.2)

## 2018-08-27 MED ORDER — DEXAMETHASONE SODIUM PHOSPHATE 10 MG/ML IJ SOLN
10.0000 mg | Freq: Once | INTRAMUSCULAR | Status: AC
  Administered 2018-08-27: 10 mg via INTRAVENOUS

## 2018-08-27 MED ORDER — CYANOCOBALAMIN 1000 MCG/ML IJ SOLN
INTRAMUSCULAR | Status: AC
  Filled 2018-08-27: qty 1

## 2018-08-27 MED ORDER — SODIUM CHLORIDE 0.9 % IV SOLN
500.0000 mg/m2 | Freq: Once | INTRAVENOUS | Status: AC
  Administered 2018-08-27: 900 mg via INTRAVENOUS
  Filled 2018-08-27: qty 20

## 2018-08-27 MED ORDER — CYANOCOBALAMIN 1000 MCG/ML IJ SOLN
1000.0000 ug | Freq: Once | INTRAMUSCULAR | Status: AC
  Administered 2018-08-27: 1000 ug via INTRAMUSCULAR

## 2018-08-27 MED ORDER — DEXAMETHASONE SODIUM PHOSPHATE 10 MG/ML IJ SOLN
INTRAMUSCULAR | Status: AC
  Filled 2018-08-27: qty 1

## 2018-08-27 MED ORDER — SODIUM CHLORIDE 0.9 % IV SOLN
Freq: Once | INTRAVENOUS | Status: AC
  Administered 2018-08-27: 15:00:00 via INTRAVENOUS
  Filled 2018-08-27: qty 250

## 2018-08-27 MED ORDER — ONDANSETRON HCL 8 MG PO TABS: 8.0000 mg | ORAL_TABLET | Freq: Once | ORAL | Status: DC

## 2018-08-27 NOTE — Patient Instructions (Signed)
[facility] Discharge Instructions for Patients Receiving Chemotherapy  Today you received the following chemotherapy agent:  Alimta  To help prevent nausea and vomiting after your treatment, we encourage you to take your nausea medication as prescribed.   If you develop nausea and vomiting that is not controlled by your nausea medication, call the clinic.   BELOW ARE SYMPTOMS THAT SHOULD BE REPORTED IMMEDIATELY:  *FEVER GREATER THAN 100.5 F  *CHILLS WITH OR WITHOUT FEVER  NAUSEA AND VOMITING THAT IS NOT CONTROLLED WITH YOUR NAUSEA MEDICATION  *UNUSUAL SHORTNESS OF BREATH  *UNUSUAL BRUISING OR BLEEDING  TENDERNESS IN MOUTH AND THROAT WITH OR WITHOUT PRESENCE OF ULCERS  *URINARY PROBLEMS  *BOWEL PROBLEMS  UNUSUAL RASH Items with * indicate a potential emergency and should be followed up as soon as possible.  Feel free to call the clinic should you have any questions or concerns. The clinic phone number is [phone].  Please show the [facility] at check-in to the Emergency Department and triage nurse.

## 2018-08-27 NOTE — Telephone Encounter (Signed)
Gave patient avs report and appointments for January - February.

## 2018-08-27 NOTE — Progress Notes (Signed)
[name] [phone]   [phone]  OFFICE PROGRESS NOTE  Alexis Roers, MD [address] [place] [address]  DIAGNOSIS: Stage IV (T2a, N0, M1b) non-small cell lung cancer, adenocarcinoma with negative EGFR and ALK mutations diagnosed in January 2015 and presented with right upper lobe lung mass in addition to a solitary brain metastasis.  PRIOR THERAPY: 1) Status post stereotactic radiotherapy to a solitary right parietal brain lesion under the care of Dr. [name] on 10/16/2013. 2) Status post palliative radiotherapy to the right lung tumor under the care of Dr. [name] completed on 12/05/2013. 3) Systemic chemotherapy with carboplatin for AUC of 5 and Alimta 500 mg/M2 every 3 weeks. First dose Jan 06 2014. Status post 6 cycles.  CURRENT THERAPY: Systemic chemotherapy with maintenance Alimta 500 MG/M2 every 3 weeks, status post 73 cycles.  INTERVAL HISTORY: Alexis [name] 67 y.o. female returns to the clinic today for follow-up visit accompanied by her husband.  The patient is feeling fine today with no concerning complaints.  She denied having any chest pain, shortness of breath, cough or hemoptysis.  She has no fever or chills.  She has no nausea, vomiting, diarrhea or constipation.  The patient continues to tolerate her treatment with maintenance Alimta fairly well.  She is here today for evaluation before starting cycle #74.   MEDICAL HISTORY: Past Medical History:  Diagnosis Date  . Anxiety   . Anxiety 06/20/2016  . Cervical cancer ([place])   . Encounter for antineoplastic chemotherapy 07/20/2015  . 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  MEDICATIONS:  Current Outpatient Medications  Medication Sig Dispense Refill  . acetaminophen (TYLENOL) 500 MG tablet Take 500 mg by mouth every 6 (six) hours as needed for mild pain or  headache. Reported on 11/02/2015    . ALPRAZolam (XANAX) 0.25 MG tablet Take 1 tablet (0.25 mg total) by mouth 3 (three) times daily as needed. 30 tablet 0  . Ascorbic Acid (VITAMIN C GUMMIE PO) Take 1 each by mouth every morning.    . benzonatate (TESSALON) 100 MG capsule Take 1 capsule (100 mg total) by mouth every 8 (eight) hours. 21 capsule 0  . dexamethasone (DECADRON) 4 MG tablet TAKE 1 TABLET BY MOUTH TWICE A DAY THE DAY BEFORE, DAY OF AND DAY AFTER THE CHEMOTHERAPY EVERY 3 WKS 40 tablet 1  . folic acid (FOLVITE) 1 MG tablet TAKE 1 TABLET BY MOUTH EVERY DAY 90 tablet 0  . Multiple Vitamin (MULTIVITAMIN WITH MINERALS) TABS tablet Take 1 tablet by mouth every morning.    Marland Kitchen omeprazole (PRILOSEC) 20 MG capsule Take 1 capsule (20 mg total) by mouth daily. As needed for reflux or indigestion. 42 capsule PRN  . ondansetron (ZOFRAN) 8 MG tablet Take one tab po before chemo. 30 tablet 0  . OVER THE COUNTER MEDICATION Take 1 tablet by mouth every morning. (Vitamin A)    . pentoxifylline (TRENTAL) 400 MG CR tablet TAKE 1 TABLET BY MOUTH TWICE A DAY (TAKE WITH VITAMIN E '400MG'$  TWICE A DAY)  3  . prochlorperazine (COMPAZINE) 10 MG tablet Take 1 tablet (10 mg total) by mouth every 6 (six) hours as needed for nausea or vomiting. 60 tablet 0  . senna-docusate (SENOKOT-S) 8.6-50 MG tablet Take 1 tablet by mouth daily. 30 tablet prn  No current facility-administered medications for this visit.     SURGICAL HISTORY:  Past Surgical History:  Procedure Laterality Date  . ABDOMINAL HYSTERECTOMY    . COLOSTOMY TAKEDOWN N/A 07/10/2014   Procedure: LAPAROSCOPIC LYSIS OF ADHESIONS (90 MIN) LAPAROSCOPIC ASSISTED COLOSTOMY CLOSURE, RIGID PROCTOSIGMOIDOSCOPY;  Surgeon: Jackolyn Confer, MD;  Location: [facility];  Service: General;  Laterality: N/A;  . LAPAROTOMY N/A 11/03/2013   Procedure: EXPLORATORY LAPAROTOMY, DRAINAGE OF INTRA  ABDOMINAL ABSCESSES, MOBILIZATION OF SPLENIC FLEXURE, SIGMOID COLECTOMY WITH COLOSTOMY;   Surgeon: Odis Hollingshead, MD;  Location: [facility];  Service: General;  Laterality: N/A;  . VIDEO BRONCHOSCOPY Bilateral 08/30/2013   Procedure: VIDEO BRONCHOSCOPY WITH FLUORO;  Surgeon: Tanda Rockers, MD;  Location: [facility];  Service: Cardiopulmonary;  Laterality: Bilateral;    REVIEW OF SYSTEMS:  A comprehensive review of systems was negative.   PHYSICAL EXAMINATION: General appearance: alert, cooperative and no distress Head: Normocephalic, without obvious abnormality, atraumatic Neck: no adenopathy, no JVD, supple, symmetrical, trachea midline and thyroid not enlarged, symmetric, no tenderness/mass/nodules Lymph nodes: Cervical, supraclavicular, and axillary nodes normal. Resp: clear to auscultation bilaterally Back: symmetric, no curvature. ROM normal. No CVA tenderness. Cardio: regular rate and rhythm, S1, S2 normal, no murmur, click, rub or gallop GI: soft, non-tender; bowel sounds normal; no masses,  no organomegaly Extremities: extremities normal, atraumatic, no cyanosis or edema  ECOG PERFORMANCE STATUS: 0 - Asymptomatic  Blood pressure 137/68, pulse (!) 109, temperature 98.5 F (36.9 C), temperature source Oral, resp. rate 19, height '5\' 4"'$  (1.626 m), weight 186 lb 11.2 oz (84.7 kg), SpO2 98 %.  LABORATORY DATA: Lab Results  Component Value Date   WBC 11.6 (H) 08/27/2018   HGB 11.5 (L) 08/27/2018   HCT 36.7 08/27/2018   MCV 101.7 (H) 08/27/2018   PLT 312 08/27/2018      Chemistry      Component Value Date/Time   NA 139 08/27/2018 1256   NA 139 08/14/2017 0837   K 3.8 08/27/2018 1256   K 4.4 08/14/2017 0837   CL 106 08/27/2018 1256   CO2 21 (L) 08/27/2018 1256   CO2 24 08/14/2017 0837   BUN 13 08/27/2018 1256   BUN 13.3 08/14/2017 0837   CREATININE 0.87 08/27/2018 1256   CREATININE 0.8 08/14/2017 0837      Component Value Date/Time   CALCIUM 9.1 08/27/2018 1256   CALCIUM 9.3 08/14/2017 0837   ALKPHOS 89 08/27/2018 1256   ALKPHOS 107 08/14/2017 0837    AST 8 (L) 08/27/2018 1256   AST 12 08/14/2017 0837   ALT 13 08/27/2018 1256   ALT 12 08/14/2017 0837   BILITOT 0.3 08/27/2018 1256   BILITOT 0.37 08/14/2017 0837       RADIOGRAPHIC STUDIES: No results found.  ASSESSMENT AND PLAN:  This is a very pleasant 67 years old white female with metastatic non-small cell lung cancer, adenocarcinoma status post induction systemic chemotherapy with carboplatin and Alimta with partial response. The patient is currently on maintenance treatment with single agent Alimta status post 73 cycles. She continues to tolerate this treatment well with no concerning adverse effects. I recommended for the patient to proceed with cycle #74 today as scheduled. I will see her back for follow-up visit in 3 weeks for evaluation before the next cycle of her treatment. The patient voices understanding of current disease status and treatment options and is in agreement with the current care plan. All questions were answered. The patient knows to call the clinic with  any problems, questions or concerns. We can certainly see the patient much sooner if necessary.  Disclaimer: This note was dictated with voice recognition software. Similar sounding words can inadvertently be transcribed and may not be corrected upon review.

## 2018-09-17 ENCOUNTER — Other Ambulatory Visit: Payer: Self-pay | Admitting: Internal Medicine

## 2018-09-17 ENCOUNTER — Encounter: Payer: Self-pay | Admitting: Internal Medicine

## 2018-09-17 ENCOUNTER — Inpatient Hospital Stay: Payer: Medicare Other

## 2018-09-17 ENCOUNTER — Telehealth: Payer: Self-pay | Admitting: Internal Medicine

## 2018-09-17 ENCOUNTER — Inpatient Hospital Stay: Payer: Medicare Other | Attending: Internal Medicine | Admitting: Internal Medicine

## 2018-09-17 DIAGNOSIS — C7931 Secondary malignant neoplasm of brain: Secondary | ICD-10-CM | POA: Diagnosis not present

## 2018-09-17 DIAGNOSIS — C3411 Malignant neoplasm of upper lobe, right bronchus or lung: Secondary | ICD-10-CM | POA: Insufficient documentation

## 2018-09-17 DIAGNOSIS — Z5111 Encounter for antineoplastic chemotherapy: Secondary | ICD-10-CM | POA: Insufficient documentation

## 2018-09-17 DIAGNOSIS — Z923 Personal history of irradiation: Secondary | ICD-10-CM | POA: Diagnosis not present

## 2018-09-17 DIAGNOSIS — Z9221 Personal history of antineoplastic chemotherapy: Secondary | ICD-10-CM | POA: Diagnosis not present

## 2018-09-17 DIAGNOSIS — C3490 Malignant neoplasm of unspecified part of unspecified bronchus or lung: Secondary | ICD-10-CM

## 2018-09-17 LAB — COMPREHENSIVE METABOLIC PANEL
ALT: 12 U/L (ref 0–44)
AST: 9 U/L — ABNORMAL LOW (ref 15–41)
Albumin: 3.5 g/dL (ref 3.5–5.0)
Alkaline Phosphatase: 92 U/L (ref 38–126)
Anion gap: 9 (ref 5–15)
BUN: 12 mg/dL (ref 8–23)
CO2: 25 mmol/L (ref 22–32)
Calcium: 9.4 mg/dL (ref 8.9–10.3)
Chloride: 108 mmol/L (ref 98–111)
Creatinine, Ser: 0.78 mg/dL (ref 0.44–1.00)
GFR calc Af Amer: >60 mL/min (ref >60)
GFR calc non Af Amer: >60 mL/min (ref >60)
Glucose, Bld: 134 mg/dL — ABNORMAL HIGH (ref 70–99)
Potassium: 4.2 mmol/L (ref 3.5–5.1)
Sodium: 142 mmol/L (ref 135–145)
Total Bilirubin: 0.4 mg/dL (ref 0.3–1.2)
Total Protein: 6.7 g/dL (ref 6.5–8.1)

## 2018-09-17 LAB — CBC WITH DIFFERENTIAL/PLATELET
Abs Immature Granulocytes: 0.07 10*3/uL (ref 0.00–0.07)
Basophils Absolute: 0 10*3/uL (ref 0.0–0.1)
Basophils Relative: 0 %
Eosinophils Absolute: 0 10*3/uL (ref 0.0–0.5)
Eosinophils Relative: 0 %
HCT: 36 % (ref 36.0–46.0)
Hemoglobin: 11.4 g/dL — ABNORMAL LOW (ref 12.0–15.0)
Immature Granulocytes: 1 %
Lymphocytes Relative: 3 %
Lymphs Abs: 0.3 10*3/uL — ABNORMAL LOW (ref 0.7–4.0)
MCH: 31.9 pg (ref 26.0–34.0)
MCHC: 31.7 g/dL (ref 30.0–36.0)
MCV: 100.8 fL — ABNORMAL HIGH (ref 80.0–100.0)
Monocytes Absolute: 0.6 10*3/uL (ref 0.1–1.0)
Monocytes Relative: 5 %
Neutro Abs: 9.6 10*3/uL — ABNORMAL HIGH (ref 1.7–7.7)
Neutrophils Relative %: 91 %
Platelets: 330 10*3/uL (ref 150–400)
RBC: 3.57 MIL/uL — ABNORMAL LOW (ref 3.87–5.11)
RDW: 15.8 % — ABNORMAL HIGH (ref 11.5–15.5)
WBC: 10.6 10*3/uL — ABNORMAL HIGH (ref 4.0–10.5)
nRBC: 0 % (ref 0.0–0.2)

## 2018-09-17 MED ORDER — SODIUM CHLORIDE 0.9 % IV SOLN
500.0000 mg/m2 | Freq: Once | INTRAVENOUS | Status: AC
  Administered 2018-09-17: 900 mg via INTRAVENOUS
  Filled 2018-09-17: qty 20

## 2018-09-17 MED ORDER — DEXAMETHASONE SODIUM PHOSPHATE 10 MG/ML IJ SOLN
INTRAMUSCULAR | Status: AC
  Filled 2018-09-17: qty 1

## 2018-09-17 MED ORDER — DEXAMETHASONE SODIUM PHOSPHATE 10 MG/ML IJ SOLN
10.0000 mg | Freq: Once | INTRAMUSCULAR | Status: AC
  Administered 2018-09-17: 10 mg via INTRAVENOUS

## 2018-09-17 MED ORDER — ONDANSETRON HCL 8 MG PO TABS: 8.0000 mg | ORAL_TABLET | Freq: Once | ORAL | Status: DC

## 2018-09-17 MED ORDER — SODIUM CHLORIDE 0.9 % IV SOLN
Freq: Once | INTRAVENOUS | Status: AC
  Administered 2018-09-17: 13:00:00 via INTRAVENOUS
  Filled 2018-09-17: qty 250

## 2018-09-17 NOTE — Patient Instructions (Signed)
Alex[facility] [name] Discharge Instructions for Patients Receiving Chemotherapy  Today you received the following chemotherapy agents :  Alimta.  To help prevent nausea and vomiting after your treatment, we encourage you to take your nausea medication as prescribed.   If you develop nausea and vomiting that is not controlled by your nausea medication, call the clinic.   BELOW ARE SYMPTOMS THAT SHOULD BE REPORTED IMMEDIATELY:  *FEVER GREATER THAN 100.5 F  *CHILLS WITH OR WITHOUT FEVER  NAUSEA AND VOMITING THAT IS NOT CONTROLLED WITH YOUR NAUSEA MEDICATION  *UNUSUAL SHORTNESS OF BREATH  *UNUSUAL BRUISING OR BLEEDING  TENDERNESS IN MOUTH AND THROAT WITH OR WITHOUT PRESENCE OF ULCERS  *URINARY PROBLEMS  *BOWEL PROBLEMS  UNUSUAL RASH Items with * indicate a potential emergency and should be followed up as soon as possible.  Feel free to call the clinic should you have any questions or concerns. The clinic phone number is [phone].  Please show the [facility] at check-in to the Emergency Department and triage nurse.

## 2018-09-17 NOTE — Progress Notes (Signed)
[facility] [phone]   [phone]  OFFICE PROGRESS NOTE  [name], MD [address] [place] [address]  DIAGNOSIS: Stage IV (T2a, N0, M1b) non-small cell lung cancer, adenocarcinoma with negative EGFR and ALK mutations diagnosed in January 2015 and presented with right upper lobe lung mass in addition to a solitary brain metastasis.  PRIOR THERAPY: 1) Status post stereotactic radiotherapy to a solitary right parietal brain lesion under the care of Dr. [name] on 10/16/2013. 2) Status post palliative radiotherapy to the right lung tumor under the care of Dr. [name] completed on 12/05/2013. 3) Systemic chemotherapy with carboplatin for AUC of 5 and Alimta 500 mg/M2 every 3 weeks. First dose Jan 06 2014. Status post 6 cycles.  CURRENT THERAPY: Systemic chemotherapy with maintenance Alimta 500 MG/M2 every 3 weeks, status post 74 cycles.  INTERVAL HISTORY: Alexis [name] 68 y.o. female returns to the clinic today for follow-up visit.  The patient is feeling fine today with no concerning complaints except for being stressed dealing with her sick mother and conflict with her siblings.  The patient denied having any chest pain, shortness of breath, cough or hemoptysis.  She denied having any fever or chills.  She has no nausea, vomiting, diarrhea or constipation.  She is here today for evaluation before starting cycle #75 of her treatment.   MEDICAL HISTORY: Past Medical History:  Diagnosis Date  . Anxiety   . Anxiety 06/20/2016  . Cervical cancer ([employer])   . Encounter for antineoplastic chemotherapy 07/20/2015  . 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  MEDICATIONS:  Current Outpatient Medications  Medication Sig Dispense Refill  . acetaminophen (TYLENOL) 500 MG tablet Take 500 mg by mouth every 6 (six) hours as needed for mild  pain or headache. Reported on 11/02/2015    . ALPRAZolam (XANAX) 0.25 MG tablet Take 1 tablet (0.25 mg total) by mouth 3 (three) times daily as needed. 30 tablet 0  . Ascorbic Acid (VITAMIN C GUMMIE PO) Take 1 each by mouth every morning.    . benzonatate (TESSALON) 100 MG capsule Take 1 capsule (100 mg total) by mouth every 8 (eight) hours. 21 capsule 0  . dexamethasone (DECADRON) 4 MG tablet TAKE 1 TABLET BY MOUTH TWICE A DAY THE DAY BEFORE, DAY OF AND DAY AFTER THE CHEMOTHERAPY EVERY 3 WKS 40 tablet 1  . folic acid (FOLVITE) 1 MG tablet TAKE 1 TABLET BY MOUTH EVERY DAY 90 tablet 0  . Multiple Vitamin (MULTIVITAMIN WITH MINERALS) TABS tablet Take 1 tablet by mouth every morning.    Marland Kitchen omeprazole (PRILOSEC) 20 MG capsule Take 1 capsule (20 mg total) by mouth daily. As needed for reflux or indigestion. 42 capsule PRN  . ondansetron (ZOFRAN) 8 MG tablet Take one tab po before chemo. 30 tablet 0  . OVER THE COUNTER MEDICATION Take 1 tablet by mouth every morning. (Vitamin A)    . pentoxifylline (TRENTAL) 400 MG CR tablet TAKE 1 TABLET BY MOUTH TWICE A DAY (TAKE WITH VITAMIN E '400MG'$  TWICE A DAY)  3  . prochlorperazine (COMPAZINE) 10 MG tablet Take 1 tablet (10 mg total) by mouth every 6 (six) hours as needed for nausea or vomiting. 60 tablet 0  . senna-docusate (SENOKOT-S) 8.6-50 MG tablet Take 1 tablet by mouth daily. 30 tablet prn  No current facility-administered medications for this visit.     SURGICAL HISTORY:  Past Surgical History:  Procedure Laterality Date  . ABDOMINAL HYSTERECTOMY    . COLOSTOMY TAKEDOWN N/A 07/10/2014   Procedure: LAPAROSCOPIC LYSIS OF ADHESIONS (90 MIN) LAPAROSCOPIC ASSISTED COLOSTOMY CLOSURE, RIGID PROCTOSIGMOIDOSCOPY;  Surgeon: Jackolyn Confer, MD;  Location: [facility];  Service: General;  Laterality: N/A;  . LAPAROTOMY N/A 11/03/2013   Procedure: EXPLORATORY LAPAROTOMY, DRAINAGE OF INTRA  ABDOMINAL ABSCESSES, MOBILIZATION OF SPLENIC FLEXURE, SIGMOID COLECTOMY WITH  COLOSTOMY;  Surgeon: Odis Hollingshead, MD;  Location[facility];  Service: General;  Laterality: N/A;  . VIDEO BRONCHOSCOPY Bilateral 08/30/2013   Procedure: VIDEO BRONCHOSCOPY WITH FLUORO;  Surgeon: Tanda Rockers, MD;  Location: [facility];  Service: Cardiopulmonary;  Laterality: Bilateral;    REVIEW OF SYSTEMS:  A comprehensive review of systems was negative except for: Constitutional: positive for fatigue Behavioral/Psych: positive for anxiety   PHYSICAL EXAMINATION: General appearance: alert, cooperative and no distress Head: Normocephalic, without obvious abnormality, atraumatic Neck: no adenopathy, no JVD, supple, symmetrical, trachea midline and thyroid not enlarged, symmetric, no tenderness/mass/nodules Lymph nodes: Cervical, supraclavicular, and axillary nodes normal. Resp: clear to auscultation bilaterally Back: symmetric, no curvature. ROM normal. No CVA tenderness. Cardio: regular rate and rhythm, S1, S2 normal, no murmur, click, rub or gallop GI: soft, non-tender; bowel sounds normal; no masses,  no organomegaly Extremities: extremities normal, atraumatic, no cyanosis or edema  ECOG PERFORMANCE STATUS: 0 - Asymptomatic  Blood pressure 128/68, pulse (!) 110, temperature 97.6 F (36.4 C), temperature source Oral, resp. rate 17, height '5\' 4"'$  (1.626 m), weight 187 lb 6.4 oz (85 kg), SpO2 100 %.  LABORATORY DATA: Lab Results  Component Value Date   WBC 10.6 (H) 09/17/2018   HGB 11.4 (L) 09/17/2018   HCT 36.0 09/17/2018   MCV 100.8 (H) 09/17/2018   PLT 330 09/17/2018      Chemistry      Component Value Date/Time   NA 142 09/17/2018 0956   NA 139 08/14/2017 0837   K 4.2 09/17/2018 0956   K 4.4 08/14/2017 0837   CL 108 09/17/2018 0956   CO2 25 09/17/2018 0956   CO2 24 08/14/2017 0837   BUN 12 09/17/2018 0956   BUN 13.3 08/14/2017 0837   CREATININE 0.78 09/17/2018 0956   CREATININE 0.8 08/14/2017 0837      Component Value Date/Time   CALCIUM 9.4 09/17/2018 0956    CALCIUM 9.3 08/14/2017 0837   ALKPHOS 92 09/17/2018 0956   ALKPHOS 107 08/14/2017 0837   AST 9 (L) 09/17/2018 0956   AST 12 08/14/2017 0837   ALT 12 09/17/2018 0956   ALT 12 08/14/2017 0837   BILITOT 0.4 09/17/2018 0956   BILITOT 0.37 08/14/2017 0837       RADIOGRAPHIC STUDIES: No results found.  ASSESSMENT AND PLAN:  This is a very pleasant 68 years old white female with metastatic non-small cell lung cancer, adenocarcinoma status post induction systemic chemotherapy with carboplatin and Alimta with partial response. The patient is currently on maintenance treatment with single agent Alimta status post 74 cycles. The patient continues to tolerate this treatment well with no concerning adverse effects. I recommended for her to proceed with cycle #75 today as scheduled. The patient will come back for follow-up visit in 3 weeks for evaluation after repeating CT scan of the chest, abdomen and pelvis for restaging of her disease. She was advised to call immediately if she has any concerning symptoms in the interval.  The patient voices understanding of current disease status and treatment options and is in agreement with the current care plan. All questions were answered. The patient knows to call the clinic with any problems, questions or concerns. We can certainly see the patient much sooner if necessary.  Disclaimer: This note was dictated with voice recognition software. Similar sounding words can inadvertently be transcribed and may not be corrected upon review.

## 2018-09-17 NOTE — Telephone Encounter (Signed)
Printed calendar and avs. °

## 2018-10-01 ENCOUNTER — Telehealth: Payer: Self-pay | Admitting: *Deleted

## 2018-10-01 ENCOUNTER — Other Ambulatory Visit: Payer: Self-pay | Admitting: *Deleted

## 2018-10-01 DIAGNOSIS — N390 Urinary tract infection, site not specified: Secondary | ICD-10-CM

## 2018-10-01 DIAGNOSIS — C3411 Malignant neoplasm of upper lobe, right bronchus or lung: Secondary | ICD-10-CM

## 2018-10-01 NOTE — Telephone Encounter (Signed)
Received VM message from patient stating that she is having S/S of UTI-burning and foul odor to urine. Denies fever/chills. Dr. Julien Nordmann recommends either pt's PCP or [facility] here.   Spoke with patient and she prefers to come here to be seen in the [facility] in the morning.  She is taking AZO for bladder pain at this time.  High priority scheduling message sent for labs and [facility] tomorrow morning.  Pt will be here for labs at 8:30 and [facility] @ 9am

## 2018-10-02 ENCOUNTER — Inpatient Hospital Stay: Payer: Medicare Other | Attending: Internal Medicine | Admitting: Medical

## 2018-10-02 ENCOUNTER — Inpatient Hospital Stay: Payer: Medicare Other

## 2018-10-02 VITALS — BP 118/64 | HR 99 | Temp 97.9°F | Resp 18 | Ht 64.0 in | Wt 187.0 lb

## 2018-10-02 DIAGNOSIS — C3411 Malignant neoplasm of upper lobe, right bronchus or lung: Secondary | ICD-10-CM | POA: Diagnosis not present

## 2018-10-02 DIAGNOSIS — C7931 Secondary malignant neoplasm of brain: Secondary | ICD-10-CM | POA: Insufficient documentation

## 2018-10-02 DIAGNOSIS — N390 Urinary tract infection, site not specified: Secondary | ICD-10-CM

## 2018-10-02 DIAGNOSIS — Z5111 Encounter for antineoplastic chemotherapy: Secondary | ICD-10-CM | POA: Diagnosis not present

## 2018-10-02 DIAGNOSIS — Z8541 Personal history of malignant neoplasm of cervix uteri: Secondary | ICD-10-CM | POA: Diagnosis not present

## 2018-10-02 DIAGNOSIS — Z79899 Other long term (current) drug therapy: Secondary | ICD-10-CM | POA: Diagnosis not present

## 2018-10-02 DIAGNOSIS — N3001 Acute cystitis with hematuria: Secondary | ICD-10-CM | POA: Insufficient documentation

## 2018-10-02 DIAGNOSIS — D6481 Anemia due to antineoplastic chemotherapy: Secondary | ICD-10-CM | POA: Insufficient documentation

## 2018-10-02 LAB — URINALYSIS, COMPLETE (UACMP) WITH MICROSCOPIC
Bilirubin Urine: NEGATIVE
Glucose, UA: NEGATIVE mg/dL
Ketones, ur: NEGATIVE mg/dL
Nitrite: POSITIVE — AB
Protein, ur: 30 mg/dL — AB
Specific Gravity, Urine: 1.015 (ref 1.005–1.030)
WBC, UA: >50 WBC/hpf — ABNORMAL HIGH (ref 0–5)
pH: 5 (ref 5.0–8.0)

## 2018-10-02 LAB — CBC WITH DIFFERENTIAL (CANCER CENTER ONLY)
Abs Immature Granulocytes: 0.04 10*3/uL (ref 0.00–0.07)
Basophils Absolute: 0 10*3/uL (ref 0.0–0.1)
Basophils Relative: 0 %
Eosinophils Absolute: 0.1 10*3/uL (ref 0.0–0.5)
Eosinophils Relative: 1 %
HCT: 36.1 % (ref 36.0–46.0)
Hemoglobin: 11.3 g/dL — ABNORMAL LOW (ref 12.0–15.0)
Immature Granulocytes: 1 %
Lymphocytes Relative: 13 %
Lymphs Abs: 0.9 10*3/uL (ref 0.7–4.0)
MCH: 31.7 pg (ref 26.0–34.0)
MCHC: 31.3 g/dL (ref 30.0–36.0)
MCV: 101.1 fL — ABNORMAL HIGH (ref 80.0–100.0)
Monocytes Absolute: 1 10*3/uL (ref 0.1–1.0)
Monocytes Relative: 15 %
Neutro Abs: 4.7 10*3/uL (ref 1.7–7.7)
Neutrophils Relative %: 70 %
Platelet Count: 164 10*3/uL (ref 150–400)
RBC: 3.57 MIL/uL — ABNORMAL LOW (ref 3.87–5.11)
RDW: 15.3 % (ref 11.5–15.5)
WBC Count: 6.6 10*3/uL (ref 4.0–10.5)
nRBC: 0 % (ref 0.0–0.2)

## 2018-10-02 LAB — CMP (CANCER CENTER ONLY)
ALT: 11 U/L (ref 0–44)
AST: 12 U/L — ABNORMAL LOW (ref 15–41)
Albumin: 3.2 g/dL — ABNORMAL LOW (ref 3.5–5.0)
Alkaline Phosphatase: 98 U/L (ref 38–126)
Anion gap: 8 (ref 5–15)
BUN: 9 mg/dL (ref 8–23)
CO2: 28 mmol/L (ref 22–32)
Calcium: 8.9 mg/dL (ref 8.9–10.3)
Chloride: 107 mmol/L (ref 98–111)
Creatinine: 0.75 mg/dL (ref 0.44–1.00)
GFR, Est AFR Am: >60 mL/min (ref >60)
GFR, Estimated: >60 mL/min (ref >60)
Glucose, Bld: 98 mg/dL (ref 70–99)
Potassium: 3.9 mmol/L (ref 3.5–5.1)
Sodium: 143 mmol/L (ref 135–145)
Total Bilirubin: 0.5 mg/dL (ref 0.3–1.2)
Total Protein: 6.3 g/dL — ABNORMAL LOW (ref 6.5–8.1)

## 2018-10-02 MED ORDER — SULFAMETHOXAZOLE-TRIMETHOPRIM 800-160 MG PO TABS: 1.0000 | ORAL_TABLET | Freq: Two times a day (BID) | ORAL | 0 refills | Status: DC

## 2018-10-02 MED ORDER — PHENAZOPYRIDINE HCL 200 MG PO TABS: 200.0000 mg | ORAL_TABLET | Freq: Three times a day (TID) | ORAL | 0 refills | Status: DC | PRN

## 2018-10-02 NOTE — Progress Notes (Signed)
[facility] Progress Note   Alexis [name] [id number] 02-Apr-1951 68 y.o.  Alexis [name] is managed by Dr. Fanny [name]. Alexis [name]  Actively treated with chemotherapy/immunotherapy/hormonal therapy: yes  Current Therapy: Alimta  Last Treated: 09/17/2018 (cycle 75, day 1)  Assessment: Plan:    Acute cystitis with hematuria - Plan: sulfamethoxazole-trimethoprim (BACTRIM DS,SEPTRA DS) 800-160 MG tablet, phenazopyridine (PYRIDIUM) 200 MG tablet  Primary cancer of right upper lobe of lung ([place])   Acute cystitis with hematuria: Urinalysis returned showing results consistent with a urinary tract infection.  The patient was given Bactrim DS p.o. twice daily x7 days and Pyridium 200 mg p.o. 3 times daily as needed.  A urine culture is pending.  Stage IV non-small cell lung cancer, adenocarcinoma: The patient continues to be followed by Dr. Julien [name] and is status post cycle 75, day 1 of Alimta which was dosed on 09/17/2018.  She is scheduled to return on 10/08/2018 to be seen by Dr. Julien [name] and for consideration of ongoing therapy.  Please see After Visit Summary for patient specific instructions.  Future Appointments  Date Time Provider Ward  10/05/2018 10:30 AM [facility]  10/08/2018  9:15 AM [facility] LAB 2 [facility] None  10/08/2018  9:45 AM Alexis Bears, MD [facility] None  10/08/2018 10:45 AM [facility] INFUSION [facility] None  10/29/2018  8:30 AM [facility] LAB 2 [facility] None  10/29/2018  9:00 AM Alexis Bears, MD [facility] None  10/29/2018  9:30 AM [facility] INFUSION [facility] None    No orders of the defined types were placed in this encounter.      Subjective:   Patient ID:  Alexis [name] is a 68 y.o. (DOB Mar 16, 1951) female.  Chief Complaint:  Chief Complaint  Patient presents with  . Urinary Tract Infection    HPI Alexis [name] is a 68 year old female with a diagnosis of a stage IV non-small cell lung cancer,  adenocarcinoma who is managed by Dr. Julien [name].  She is status post cycle 75, day 1 of Alimta which was dosed on 09/17/2018.  She presents to the office today with a report of dysuria, suprapubic pressure, and bilateral flank pain since this weekend.  She has been taking over-the-counter Azo with only mild improvement in her dysuria.  She has had some chills but denies fever, nausea, vomiting, diarrhea, constipation, chest pain, or shortness of breath.  Patient's mother is in a rehab facility after suffering a heart attack.  Medications: I have reviewed the patient's current medications.  Allergies:  Allergies  Allergen Reactions  . Codeine Nausea And Vomiting    Past Medical History:  Diagnosis Date  . Anxiety   . Anxiety 06/20/2016  . Cervical cancer ([place])   . Encounter for antineoplastic chemotherapy 07/20/2015  . 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 ABDOMINAL HYSTERECTOMY    . COLOSTOMY TAKEDOWN N/A 07/10/2014   Procedure: LAPAROSCOPIC LYSIS OF ADHESIONS (90 MIN) LAPAROSCOPIC ASSISTED COLOSTOMY CLOSURE, RIGID PROCTOSIGMOIDOSCOPY;  Surgeon: Jackolyn Confer, MD;  [facility];  Service: General;  Laterality: N/A;  . LAPAROTOMY N/A 11/03/2013   Procedure: EXPLORATORY LAPAROTOMY, DRAINAGE OF INTRA  ABDOMINAL ABSCESSES, MOBILIZATION OF SPLENIC FLEXURE, SIGMOID COLECTOMY WITH COLOSTOMY;  Surgeon: Odis Hollingshead, MD;  [facility];  Service: General;  Laterality: N/A;  . VIDEO BRONCHOSCOPY Bilateral 08/30/2013   Procedure:  VIDEO BRONCHOSCOPY WITH FLUORO;  Surgeon: Tanda Rockers, MD;  Location: [facility];  Service: Cardiopulmonary;  Laterality: Bilateral;    Family History  Problem Relation Age of Onset  . Emphysema Father        smoked  . Lung cancer Father        smoked  . Cancer Mother   . Hypertension Mother   . COPD Mother     Social  History   Socioeconomic History  . Marital status: Married    Spouse name: Not on file  . Number of children: Not on file  . Years of education: Not on file  . Highest education level: Not on file  Occupational History  . 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 Financial resource strain: Not on file  . Food insecurity:    Worry: Not on file    Inability: Not on file  . Transportation needs:    Medical: Not on file    Non-medical: Not on file  Tobacco Use  . Smoking status: Former Smoker    Packs/day: 1.00    Years: 40.00    Pack years: 40.00    Types: Cigarettes    Last attempt to quit: 09/27/2013    Years since quitting: 5.0  . Smokeless tobacco: Never Used  Substance and Sexual Activity  . Alcohol use: No  . Drug use: No  . Sexual activity: Yes  Lifestyle  . Physical activity:    Days per week: Not on file    Minutes per session: Not on file  . Stress: Not on file  Relationships  . Social connections:    Talks on phone: Not on file    Gets together: Not on file    Attends religious service: Not on file    Active member of club or organization: Not on file    Attends meetings of clubs or organizations: Not on file    Relationship status: Not on file  . Intimate partner violence:    Fear of current or ex partner: Not on file    Emotionally abused: Not on file    Physically abused: Not on file    Forced sexual activity: Not on file  Other Topics Concern  . Not on file  Social History Narrative  . Not on file    Past Medical History, Surgical history, Social history, and Family history were reviewed and updated as appropriate.   Please see review of systems for further details on the patient's review from today.   Review of Systems:  Review of Systems  Constitutional: Negative for chills, diaphoresis and fever.  Respiratory: Negative for cough and shortness of breath.   Cardiovascular: Negative for chest pain, palpitations and  leg swelling.  Gastrointestinal: Negative for abdominal pain, constipation, diarrhea, nausea and vomiting.  Genitourinary: Positive for dysuria and frequency. Negative for difficulty urinating, flank pain, hematuria and urgency.       Suprapubic pressure  Musculoskeletal: Positive for back pain.  Skin: Negative for rash.  Neurological: Negative for dizziness and headaches.    Objective:   Physical Exam:  BP 118/64 (BP Location: Left Arm, Patient Position: Sitting)   Pulse 99   Temp 97.9 F (36.6 C) (Oral)   Resp 18   Ht 5\' 4"  (1.626 m)   Wt 187 lb (84.8 kg)   SpO2 100%   BMI 32.10 kg/m  ECOG: 0  Physical Exam Constitutional:      General: She is not  in acute distress.    Appearance: Normal appearance. She is not diaphoretic.  HENT:     Head: Normocephalic and atraumatic.  Cardiovascular:     Rate and Rhythm: Normal rate and regular rhythm.     Heart sounds: Normal heart sounds. No murmur. No friction rub. No gallop.   Pulmonary:     Effort: Pulmonary effort is normal. No respiratory distress.     Breath sounds: Normal breath sounds. No wheezing or rales.  Skin:    General: Skin is warm and dry.  Neurological:     Mental Status: She is alert.     Coordination: Coordination normal.     Gait: Gait normal.  Psychiatric:        Behavior: Behavior normal.        Thought Content: Thought content normal.        Judgment: Judgment normal.     Lab Review:     Component Value Date/Time   NA 143 10/02/2018 1212   NA 139 08/14/2017 0837   K 3.9 10/02/2018 1212   K 4.4 08/14/2017 0837   CL 107 10/02/2018 1212   CO2 28 10/02/2018 1212   CO2 24 08/14/2017 0837   GLUCOSE 98 10/02/2018 1212   GLUCOSE 114 08/14/2017 0837   BUN 9 10/02/2018 1212   BUN 13.3 08/14/2017 0837   CREATININE 0.75 10/02/2018 1212   CREATININE 0.8 08/14/2017 0837   CALCIUM 8.9 10/02/2018 1212   CALCIUM 9.3 08/14/2017 0837   PROT 6.3 (L) 10/02/2018 1212   PROT 7.1 08/14/2017 0837   ALBUMIN 3.2  (L) 10/02/2018 1212   ALBUMIN 3.5 08/14/2017 0837   AST 12 (L) 10/02/2018 1212   AST 12 08/14/2017 0837   ALT 11 10/02/2018 1212   ALT 12 08/14/2017 0837   ALKPHOS 98 10/02/2018 1212   ALKPHOS 107 08/14/2017 0837   BILITOT 0.5 10/02/2018 1212   BILITOT 0.37 08/14/2017 0837   GFRNONAA >60 10/02/2018 1212   GFRAA >60 10/02/2018 1212       Component Value Date/Time   WBC 6.6 10/02/2018 1212   WBC 10.6 (H) 09/17/2018 0956   RBC 3.57 (L) 10/02/2018 1212   HGB 11.3 (L) 10/02/2018 1212   HGB 11.8 08/14/2017 0837   HCT 36.1 10/02/2018 1212   HCT 35.0 08/14/2017 0837   PLT 164 10/02/2018 1212   PLT 293 08/14/2017 0837   MCV 101.1 (H) 10/02/2018 1212   MCV 98.6 08/14/2017 0837   MCH 31.7 10/02/2018 1212   MCHC 31.3 10/02/2018 1212   RDW 15.3 10/02/2018 1212   RDW 14.3 08/14/2017 0837   LYMPHSABS 0.9 10/02/2018 1212   LYMPHSABS 0.3 (L) 08/14/2017 0837   MONOABS 1.0 10/02/2018 1212   MONOABS 0.6 08/14/2017 0837   EOSABS 0.1 10/02/2018 1212   EOSABS 0.0 08/14/2017 0837   BASOSABS 0.0 10/02/2018 1212   BASOSABS 0.0 08/14/2017 0837   -------------------------------  Imaging from last 24 hours (if applicable):  Radiology interpretation: No results found.      This case was discussed with Dr. Julien [name]. He expressed agreement with my management of this patient.

## 2018-10-02 NOTE — Patient Instructions (Signed)
Urinary Tract Infection, Adult A urinary tract infection (UTI) is an infection of any part of the urinary tract. The urinary tract includes:  The kidneys.  The ureters.  The bladder.  The urethra. These organs make, store, and get rid of pee (urine) in the body. What are the causes? This is caused by germs (bacteria) in your genital area. These germs grow and cause swelling (inflammation) of your urinary tract. What increases the risk? You are more likely to develop this condition if:  You have a small, thin tube (catheter) to drain pee.  You cannot control when you pee or poop (incontinence).  You are female, and: ? You use these methods to prevent pregnancy: ? A medicine that kills sperm (spermicide). ? A device that blocks sperm (diaphragm). ? You have low levels of a female hormone (estrogen). ? You are pregnant.  You have genes that add to your risk.  You are sexually active.  You take antibiotic medicines.  You have trouble peeing because of: ? A prostate that is bigger than normal, if you are female. ? A blockage in the part of your body that drains pee from the bladder (urethra). ? A kidney stone. ? A nerve condition that affects your bladder (neurogenic bladder). ? Not getting enough to drink. ? Not peeing often enough.  You have other conditions, such as: ? Diabetes. ? A weak disease-fighting system (immune system). ? Sickle cell disease. ? Gout. ? Injury of the spine. What are the signs or symptoms? Symptoms of this condition include:  Needing to pee right away (urgently).  Peeing often.  Peeing small amounts often.  Pain or burning when peeing.  Blood in the pee.  Pee that smells bad or not like normal.  Trouble peeing.  Pee that is cloudy.  Fluid coming from the vagina, if you are female.  Pain in the belly or lower back. Other symptoms include:  Throwing up (vomiting).  No urge to eat.  Feeling mixed up (confused).  Being tired  and grouchy (irritable).  A fever.  Watery poop (diarrhea). How is this treated? This condition may be treated with:  Antibiotic medicine.  Other medicines.  Drinking enough water. Follow these instructions at home:  Medicines  Take over-the-counter and prescription medicines only as told by your doctor.  If you were prescribed an antibiotic medicine, take it as told by your doctor. Do not stop taking it even if you start to feel better. General instructions  Make sure you: ? Pee until your bladder is empty. ? Do not hold pee for a long time. ? Empty your bladder after sex. ? Wipe from front to back after pooping if you are a female. Use each tissue one time when you wipe.  Drink enough fluid to keep your pee pale yellow.  Keep all follow-up visits as told by your doctor. This is important. Contact a doctor if:  You do not get better after 1-2 days.  Your symptoms go away and then come back. Get help right away if:  You have very bad back pain.  You have very bad pain in your lower belly.  You have a fever.  You are sick to your stomach (nauseous).  You are throwing up. Summary  A urinary tract infection (UTI) is an infection of any part of the urinary tract.  This condition is caused by germs in your genital area.  There are many risk factors for a UTI. These include having a small, thin   tube to drain pee and not being able to control when you pee or poop.  Treatment includes antibiotic medicines for germs.  Drink enough fluid to keep your pee pale yellow. This information is not intended to replace advice given to you by your health care provider. Make sure you discuss any questions you have with your health care provider. Document Released: 02/01/2008 Document Revised: 02/22/2018 Document Reviewed: 02/22/2018 Elsevier Interactive Patient Education  2019 Elsevier Inc.  

## 2018-10-02 NOTE — Progress Notes (Signed)
Pt presents today with dysuria, pressure w/urination, polyuria, and lower back pain.  Afebrile.  Denies hematuria.  Denies N/V/D or changes in bowel habits.  Denies CP or SOB.

## 2018-10-04 LAB — URINE CULTURE: Culture: >=100000 — AB

## 2018-10-05 ENCOUNTER — Ambulatory Visit (HOSPITAL_COMMUNITY)
Admission: RE | Admit: 2018-10-05 | Discharge: 2018-10-05 | Disposition: A | Discharge status: Home / Self Care | Payer: Medicare Other | Source: Ambulatory Visit | Attending: Internal Medicine | Admitting: Internal Medicine

## 2018-10-05 ENCOUNTER — Encounter (HOSPITAL_COMMUNITY): Payer: Self-pay

## 2018-10-05 DIAGNOSIS — C3490 Malignant neoplasm of unspecified part of unspecified bronchus or lung: Secondary | ICD-10-CM | POA: Insufficient documentation

## 2018-10-05 DIAGNOSIS — C3411 Malignant neoplasm of upper lobe, right bronchus or lung: Secondary | ICD-10-CM | POA: Diagnosis not present

## 2018-10-05 MED ORDER — SODIUM CHLORIDE (PF) 0.9 % IJ SOLN
INTRAMUSCULAR | Status: AC
  Filled 2018-10-05: qty 50

## 2018-10-05 MED ORDER — IOHEXOL 300 MG/ML  SOLN
100.0000 mL | Freq: Once | INTRAMUSCULAR | Status: AC | PRN
  Administered 2018-10-05: 100 mL via INTRAVENOUS

## 2018-10-08 ENCOUNTER — Telehealth: Payer: Self-pay | Admitting: Internal Medicine

## 2018-10-08 ENCOUNTER — Ambulatory Visit: Payer: Medicare Other

## 2018-10-08 ENCOUNTER — Inpatient Hospital Stay (HOSPITAL_BASED_OUTPATIENT_CLINIC_OR_DEPARTMENT_OTHER): Payer: Medicare Other | Admitting: Internal Medicine

## 2018-10-08 ENCOUNTER — Encounter: Payer: Self-pay | Admitting: Internal Medicine

## 2018-10-08 ENCOUNTER — Inpatient Hospital Stay: Payer: Medicare Other

## 2018-10-08 ENCOUNTER — Other Ambulatory Visit: Payer: Medicare Other

## 2018-10-08 ENCOUNTER — Ambulatory Visit: Payer: Medicare Other | Admitting: Oncology

## 2018-10-08 VITALS — HR 95 | Resp 18

## 2018-10-08 VITALS — BP 121/58 | Temp 98.6°F | Ht 64.0 in | Wt 185.5 lb

## 2018-10-08 DIAGNOSIS — C7931 Secondary malignant neoplasm of brain: Secondary | ICD-10-CM

## 2018-10-08 DIAGNOSIS — N3001 Acute cystitis with hematuria: Secondary | ICD-10-CM | POA: Diagnosis not present

## 2018-10-08 DIAGNOSIS — Z5111 Encounter for antineoplastic chemotherapy: Secondary | ICD-10-CM

## 2018-10-08 DIAGNOSIS — C3411 Malignant neoplasm of upper lobe, right bronchus or lung: Secondary | ICD-10-CM

## 2018-10-08 DIAGNOSIS — Z79899 Other long term (current) drug therapy: Secondary | ICD-10-CM | POA: Diagnosis not present

## 2018-10-08 DIAGNOSIS — C7949 Secondary malignant neoplasm of other parts of nervous system: Secondary | ICD-10-CM

## 2018-10-08 DIAGNOSIS — D6481 Anemia due to antineoplastic chemotherapy: Secondary | ICD-10-CM

## 2018-10-08 LAB — CBC WITH DIFFERENTIAL/PLATELET
Abs Immature Granulocytes: 0.11 10*3/uL — ABNORMAL HIGH (ref 0.00–0.07)
Basophils Absolute: 0 10*3/uL (ref 0.0–0.1)
Basophils Relative: 0 %
Eosinophils Absolute: 0 10*3/uL (ref 0.0–0.5)
Eosinophils Relative: 0 %
HCT: 37 % (ref 36.0–46.0)
Hemoglobin: 11.5 g/dL — ABNORMAL LOW (ref 12.0–15.0)
Immature Granulocytes: 1 %
Lymphocytes Relative: 4 %
Lymphs Abs: 0.4 10*3/uL — ABNORMAL LOW (ref 0.7–4.0)
MCH: 31.5 pg (ref 26.0–34.0)
MCHC: 31.1 g/dL (ref 30.0–36.0)
MCV: 101.4 fL — ABNORMAL HIGH (ref 80.0–100.0)
Monocytes Absolute: 0.5 10*3/uL (ref 0.1–1.0)
Monocytes Relative: 4 %
Neutro Abs: 10.3 10*3/uL — ABNORMAL HIGH (ref 1.7–7.7)
Neutrophils Relative %: 91 %
Platelets: 387 10*3/uL (ref 150–400)
RBC: 3.65 MIL/uL — ABNORMAL LOW (ref 3.87–5.11)
RDW: 16 % — ABNORMAL HIGH (ref 11.5–15.5)
WBC: 11.4 10*3/uL — ABNORMAL HIGH (ref 4.0–10.5)
nRBC: 0 % (ref 0.0–0.2)

## 2018-10-08 LAB — COMPREHENSIVE METABOLIC PANEL
ALT: 11 U/L (ref 0–44)
AST: 12 U/L — ABNORMAL LOW (ref 15–41)
Albumin: 3.6 g/dL (ref 3.5–5.0)
Alkaline Phosphatase: 96 U/L (ref 38–126)
Anion gap: 10 (ref 5–15)
BUN: 14 mg/dL (ref 8–23)
CO2: 22 mmol/L (ref 22–32)
Calcium: 9.6 mg/dL (ref 8.9–10.3)
Chloride: 106 mmol/L (ref 98–111)
Creatinine, Ser: 0.89 mg/dL (ref 0.44–1.00)
GFR calc Af Amer: >60 mL/min (ref >60)
GFR calc non Af Amer: >60 mL/min (ref >60)
Glucose, Bld: 190 mg/dL — ABNORMAL HIGH (ref 70–99)
Potassium: 4.5 mmol/L (ref 3.5–5.1)
Sodium: 138 mmol/L (ref 135–145)
Total Bilirubin: 0.6 mg/dL (ref 0.3–1.2)
Total Protein: 6.9 g/dL (ref 6.5–8.1)

## 2018-10-08 MED ORDER — DEXAMETHASONE SODIUM PHOSPHATE 10 MG/ML IJ SOLN
10.0000 mg | Freq: Once | INTRAMUSCULAR | Status: AC
  Administered 2018-10-08: 10 mg via INTRAVENOUS

## 2018-10-08 MED ORDER — SODIUM CHLORIDE 0.9 % IV SOLN
Freq: Once | INTRAVENOUS | Status: AC
  Administered 2018-10-08: 12:00:00 via INTRAVENOUS
  Filled 2018-10-08: qty 250

## 2018-10-08 MED ORDER — SODIUM CHLORIDE 0.9 % IV SOLN
500.0000 mg/m2 | Freq: Once | INTRAVENOUS | Status: AC
  Administered 2018-10-08: 900 mg via INTRAVENOUS
  Filled 2018-10-08: qty 20

## 2018-10-08 MED ORDER — ONDANSETRON HCL 8 MG PO TABS: 8.0000 mg | ORAL_TABLET | Freq: Once | ORAL | Status: DC

## 2018-10-08 MED ORDER — DEXAMETHASONE SODIUM PHOSPHATE 10 MG/ML IJ SOLN
INTRAMUSCULAR | Status: AC
  Filled 2018-10-08: qty 1

## 2018-10-08 NOTE — Patient Instructions (Signed)
[facility] Discharge Instructions for Patients Receiving Chemotherapy  Today you received the following chemotherapy agents :  Alimta.  To help prevent nausea and vomiting after your treatment, we encourage you to take your nausea medication as prescribed.   If you develop nausea and vomiting that is not controlled by your nausea medication, call the clinic.   BELOW ARE SYMPTOMS THAT SHOULD BE REPORTED IMMEDIATELY:  *FEVER GREATER THAN 100.5 F  *CHILLS WITH OR WITHOUT FEVER  NAUSEA AND VOMITING THAT IS NOT CONTROLLED WITH YOUR NAUSEA MEDICATION  *UNUSUAL SHORTNESS OF BREATH  *UNUSUAL BRUISING OR BLEEDING  TENDERNESS IN MOUTH AND THROAT WITH OR WITHOUT PRESENCE OF ULCERS  *URINARY PROBLEMS  *BOWEL PROBLEMS  UNUSUAL RASH Items with * indicate a potential emergency and should be followed up as soon as possible.  Feel free to call the clinic should you have any questions or concerns. The clinic phone number is [phone].  Please show the [facility] at check-in to the Emergency Department and triage nurse.

## 2018-10-08 NOTE — Progress Notes (Signed)
[facility] [phone]   [phone]  OFFICE PROGRESS NOTE  Alexis Roers, MD [address] [place] [address]  DIAGNOSIS: Stage IV (T2a, N0, M1b) non-small cell lung cancer, adenocarcinoma with negative EGFR and ALK mutations diagnosed in January 2015 and presented with right upper lobe lung mass in addition to a solitary brain metastasis.  PRIOR THERAPY: 1) Status post stereotactic radiotherapy to a solitary right parietal brain lesion under the care of Dr. [name] on 10/16/2013. 2) Status post palliative radiotherapy to the right lung tumor under the care of Dr. [name] completed on 12/05/2013. 3) Systemic chemotherapy with carboplatin for AUC of 5 and Alimta 500 mg/M2 every 3 weeks. First dose Jan 06 2014. Status post 6 cycles.  CURRENT THERAPY: Systemic chemotherapy with maintenance Alimta 500 MG/M2 every 3 weeks, status post 75 cycles.  INTERVAL HISTORY: Alexis [name] 68 y.o. female returns to the clinic today for follow-up visit accompanied by her husband.  The patient is feeling fine today with no concerning complaints.  She was treated for urinary tract infection recently.  She denied having any current fever or chills.  She has no chest pain, shortness of breath, cough or hemoptysis.  The patient denied having any nausea, vomiting, diarrhea or constipation.  She has no recent weight loss or night sweats.  She continues to tolerate her maintenance treatment with Alimta fairly well.  The patient is here today for evaluation before starting cycle #76 of her treatment.  She had repeat CT scan of the chest, abdomen and pelvis for restaging of her disease.   MEDICAL HISTORY: Past Medical History:  Diagnosis Date  . Anxiety   . Anxiety 06/20/2016  . Cervical cancer ([place])   . Encounter for antineoplastic chemotherapy 07/20/2015  . Malignant neoplasm of right upper lobe of lung ([facility])     non small cell lung cancer adenocarcioma with brain meta     ALLERGIES:  is allergic to codeine.  MEDICATIONS:  Current Outpatient Medications  Medication Sig Dispense Refill  . acetaminophen (TYLENOL) 500 MG tablet Take 500 mg by mouth every 6 (six) hours as needed for mild pain or headache. Reported on 11/02/2015    . ALPRAZolam (XANAX) 0.25 MG tablet TAKE 1 TABLET BY MOUTH 3 TIMES A DAY AS NEEDED 30 tablet 0  . Ascorbic Acid (VITAMIN C GUMMIE PO) Take 1 each by mouth every morning.    Marland Kitchen dexamethasone (DECADRON) 4 MG tablet TAKE 1 TABLET BY MOUTH TWICE A DAY THE DAY BEFORE, DAY OF AND DAY AFTER THE CHEMOTHERAPY EVERY 3 WKS 40 tablet 1  . folic acid (FOLVITE) 1 MG tablet TAKE 1 TABLET BY MOUTH EVERY DAY 90 tablet 0  . Multiple Vitamin (MULTIVITAMIN WITH MINERALS) TABS tablet Take 1 tablet by mouth every morning.    Marland Kitchen omeprazole (PRILOSEC) 20 MG capsule Take 1 capsule (20 mg total) by mouth daily. As needed for reflux or indigestion. 42 capsule PRN  . ondansetron (ZOFRAN) 8 MG tablet Take one tab po before chemo. 30 tablet 0  . OVER THE COUNTER MEDICATION Take 1 tablet by mouth every morning. (Vitamin A)    . phenazopyridine (PYRIDIUM) 200 MG tablet Take 1 tablet (200 mg total) by mouth 3 (three) times daily as needed for pain. 15 tablet 0  . prochlorperazine (COMPAZINE) 10 MG tablet Take 1 tablet (10 mg total) by mouth every 6 (six) hours as needed for nausea or vomiting. 60 tablet 0  .  senna-docusate (SENOKOT-S) 8.6-50 MG tablet Take 1 tablet by mouth daily. 30 tablet prn  . sulfamethoxazole-trimethoprim (BACTRIM DS,SEPTRA DS) 800-160 MG tablet Take 1 tablet by mouth 2 (two) times daily. 14 tablet 0   No current facility-administered medications for this visit.     SURGICAL HISTORY:  Past Surgical History:  Procedure Laterality Date  . ABDOMINAL HYSTERECTOMY    . COLOSTOMY TAKEDOWN N/A 07/10/2014   Procedure: LAPAROSCOPIC LYSIS OF ADHESIONS (90 MIN) LAPAROSCOPIC ASSISTED COLOSTOMY CLOSURE, RIGID PROCTOSIGMOIDOSCOPY;  Surgeon: Jackolyn Confer,  MD;  Location: [facility];  Service: General;  Laterality: N/A;  . LAPAROTOMY N/A 11/03/2013   Procedure: EXPLORATORY LAPAROTOMY, DRAINAGE OF INTRA  ABDOMINAL ABSCESSES, MOBILIZATION OF SPLENIC FLEXURE, SIGMOID COLECTOMY WITH COLOSTOMY;  Surgeon: Odis Hollingshead, MD;  Location: [facility];  Service: General;  Laterality: N/A;  . VIDEO BRONCHOSCOPY Bilateral 08/30/2013   Procedure: VIDEO BRONCHOSCOPY WITH FLUORO;  Surgeon: Tanda Rockers, MD;  Location: [facility];  Service: Cardiopulmonary;  Laterality: Bilateral;    REVIEW OF SYSTEMS:  Constitutional: negative Eyes: negative Ears, nose, mouth, throat, and face: negative Respiratory: negative Cardiovascular: negative Gastrointestinal: negative Genitourinary:negative Integument/breast: negative Hematologic/lymphatic: negative Musculoskeletal:negative Neurological: negative Behavioral/Psych: negative Endocrine: negative Allergic/Immunologic: negative   PHYSICAL EXAMINATION: General appearance: alert, cooperative and no distress Head: Normocephalic, without obvious abnormality, atraumatic Neck: no adenopathy, no JVD, supple, symmetrical, trachea midline and thyroid not enlarged, symmetric, no tenderness/mass/nodules Lymph nodes: Cervical, supraclavicular, and axillary nodes normal. Resp: clear to auscultation bilaterally Back: symmetric, no curvature. ROM normal. No CVA tenderness. Cardio: regular rate and rhythm, S1, S2 normal, no murmur, click, rub or gallop GI: soft, non-tender; bowel sounds normal; no masses,  no organomegaly Extremities: extremities normal, atraumatic, no cyanosis or edema Neurologic: Alert and oriented X 3, normal strength and tone. Normal symmetric reflexes. Normal coordination and gait  ECOG PERFORMANCE STATUS: 0 - Asymptomatic  Blood pressure (!) 121/58, temperature 98.6 F (37 C), temperature source Oral, height 5' 4" (1.626 m), weight 185 lb 8 oz (84.1 kg), SpO2 98 %.  LABORATORY DATA: Lab Results  Component  Value Date   WBC 11.4 (H) 10/08/2018   HGB 11.5 (L) 10/08/2018   HCT 37.0 10/08/2018   MCV 101.4 (H) 10/08/2018   PLT 387 10/08/2018      Chemistry      Component Value Date/Time   NA 138 10/08/2018 0910   NA 139 08/14/2017 0837   K 4.5 10/08/2018 0910   K 4.4 08/14/2017 0837   CL 106 10/08/2018 0910   CO2 22 10/08/2018 0910   CO2 24 08/14/2017 0837   BUN 14 10/08/2018 0910   BUN 13.3 08/14/2017 0837   CREATININE 0.89 10/08/2018 0910   CREATININE 0.75 10/02/2018 1212   CREATININE 0.8 08/14/2017 0837      Component Value Date/Time   CALCIUM 9.6 10/08/2018 0910   CALCIUM 9.3 08/14/2017 0837   ALKPHOS 96 10/08/2018 0910   ALKPHOS 107 08/14/2017 0837   AST 12 (L) 10/08/2018 0910   AST 12 (L) 10/02/2018 1212   AST 12 08/14/2017 0837   ALT 11 10/08/2018 0910   ALT 11 10/02/2018 1212   ALT 12 08/14/2017 0837   BILITOT 0.6 10/08/2018 0910   BILITOT 0.5 10/02/2018 1212   BILITOT 0.37 08/14/2017 0837       RADIOGRAPHIC STUDIES: Ct Chest W Contrast  Result Date: 10/05/2018 CLINICAL DATA:  Stage IV adenocarcinoma of the right upper lobe. EXAM: CT CHEST, ABDOMEN, AND PELVIS WITH CONTRAST TECHNIQUE: Multidetector CT imaging of  the chest, abdomen and pelvis was performed following the standard protocol during bolus administration of intravenous contrast. CONTRAST:  181m OMNIPAQUE IOHEXOL 300 MG/ML  SOLN COMPARISON:  06/21/2018 FINDINGS: CT CHEST FINDINGS Cardiovascular: The heart size is normal. No substantial pericardial effusion. Atherosclerotic calcification is noted in the wall of the thoracic aorta. Mediastinum/Nodes: No mediastinal lymphadenopathy. There is no hilar lymphadenopathy. The esophagus has normal imaging features. There is no axillary lymphadenopathy. Lungs/Pleura: The central tracheobronchial airways are patent. Centrilobular and paraseptal emphysema again noted. Left apical pleuroparenchymal scarring is stable. The posterior right apical lesion measured previously at  3.7 x 3.1 cm is stable today, measuring 3.6 x 3.0 cm (28/4). No new suspicious nodule or mass. Several ill-defined nodular ground-glass opacities are seen in the medial right lower lobe measuring up to about 7 mm (94/4). These are probably related to atelectasis although infectious/inflammatory alveolitis is a consideration. Musculoskeletal: Unremarkable. CT ABDOMEN PELVIS FINDINGS Hepatobiliary: No suspicious focal abnormality within the liver parenchyma. There is no evidence for gallstones, gallbladder wall thickening, or pericholecystic fluid. No intrahepatic or extrahepatic biliary dilation. Pancreas: No focal mass lesion. No dilatation of the main duct. No intraparenchymal cyst. No peripancreatic edema. Spleen: No splenomegaly. No focal mass lesion. Adrenals/Urinary Tract: No adrenal nodule or mass. Kidneys unremarkable. No evidence for hydroureter. The urinary bladder appears normal for the degree of distention. Stomach/Bowel: Stomach is unremarkable. No gastric wall thickening. No evidence of outlet obstruction. Duodenum is normally positioned as is the ligament of Treitz. No small bowel wall thickening. No small bowel dilatation. The terminal ileum is normal. The appendix is normal. No gross colonic mass. No colonic wall thickening. Anastomotic staple line noted distal sigmoid colon/rectosigmoid junction. Vascular/Lymphatic: There is abdominal aortic atherosclerosis without aneurysm. There is no gastrohepatic or hepatoduodenal ligament lymphadenopathy. No intraperitoneal or retroperitoneal lymphadenopathy. No pelvic sidewall lymphadenopathy. Reproductive: The uterus is surgically absent. There is no adnexal mass. Other: No intraperitoneal free fluid. Musculoskeletal: No worrisome lytic or sclerotic osseous abnormality. Right paraumbilical hernia contains only fat. Small fat containing umbilical hernia evident. IMPRESSION: 1. Stable appearance posterior right apical lung lesion. 2. New small ground-glass  opacities in the medial right lower lobe likely reflect atelectasis or infectious/inflammatory alveolitis. Attention on follow-up recommended. 3. No metastatic disease evident in the abdomen or pelvis. 4.  Aortic Atherosclerois (ICD10-170.0) 5.  Emphysema. (([id number]) Electronically Signed   By: EMisty StanleyM.D.   On: 10/05/2018 13:47   Ct Abdomen Pelvis W Contrast  Result Date: 10/05/2018 CLINICAL DATA:  Stage IV adenocarcinoma of the right upper lobe. EXAM: CT CHEST, ABDOMEN, AND PELVIS WITH CONTRAST TECHNIQUE: Multidetector CT imaging of the chest, abdomen and pelvis was performed following the standard protocol during bolus administration of intravenous contrast. CONTRAST:  1056mOMNIPAQUE IOHEXOL 300 MG/ML  SOLN COMPARISON:  06/21/2018 FINDINGS: CT CHEST FINDINGS Cardiovascular: The heart size is normal. No substantial pericardial effusion. Atherosclerotic calcification is noted in the wall of the thoracic aorta. Mediastinum/Nodes: No mediastinal lymphadenopathy. There is no hilar lymphadenopathy. The esophagus has normal imaging features. There is no axillary lymphadenopathy. Lungs/Pleura: The central tracheobronchial airways are patent. Centrilobular and paraseptal emphysema again noted. Left apical pleuroparenchymal scarring is stable. The posterior right apical lesion measured previously at 3.7 x 3.1 cm is stable today, measuring 3.6 x 3.0 cm (28/4). No new suspicious nodule or mass. Several ill-defined nodular ground-glass opacities are seen in the medial right lower lobe measuring up to about 7 mm (94/4). These are probably related to atelectasis although infectious/inflammatory alveolitis is  a consideration. Musculoskeletal: Unremarkable. CT ABDOMEN PELVIS FINDINGS Hepatobiliary: No suspicious focal abnormality within the liver parenchyma. There is no evidence for gallstones, gallbladder wall thickening, or pericholecystic fluid. No intrahepatic or extrahepatic biliary dilation. Pancreas: No focal  mass lesion. No dilatation of the main duct. No intraparenchymal cyst. No peripancreatic edema. Spleen: No splenomegaly. No focal mass lesion. Adrenals/Urinary Tract: No adrenal nodule or mass. Kidneys unremarkable. No evidence for hydroureter. The urinary bladder appears normal for the degree of distention. Stomach/Bowel: Stomach is unremarkable. No gastric wall thickening. No evidence of outlet obstruction. Duodenum is normally positioned as is the ligament of Treitz. No small bowel wall thickening. No small bowel dilatation. The terminal ileum is normal. The appendix is normal. No gross colonic mass. No colonic wall thickening. Anastomotic staple line noted distal sigmoid colon/rectosigmoid junction. Vascular/Lymphatic: There is abdominal aortic atherosclerosis without aneurysm. There is no gastrohepatic or hepatoduodenal ligament lymphadenopathy. No intraperitoneal or retroperitoneal lymphadenopathy. No pelvic sidewall lymphadenopathy. Reproductive: The uterus is surgically absent. There is no adnexal mass. Other: No intraperitoneal free fluid. Musculoskeletal: No worrisome lytic or sclerotic osseous abnormality. Right paraumbilical hernia contains only fat. Small fat containing umbilical hernia evident. IMPRESSION: 1. Stable appearance posterior right apical lung lesion. 2. New small ground-glass opacities in the medial right lower lobe likely reflect atelectasis or infectious/inflammatory alveolitis. Attention on follow-up recommended. 3. No metastatic disease evident in the abdomen or pelvis. 4.  Aortic Atherosclerois (ICD10-170.0) 5.  Emphysema. ([id number]) Electronically Signed   By: Misty Stanley M.D.   On: 10/05/2018 13:47    ASSESSMENT AND PLAN:  This is a very pleasant 67 years old white female with metastatic non-small cell lung cancer, adenocarcinoma status post induction systemic chemotherapy with carboplatin and Alimta with partial response. The patient is currently on maintenance treatment  with single agent Alimta status post 75 cycles. The patient continues to tolerate her treatment well with no concerning adverse effects. She had repeat CT scan of the chest, abdomen and pelvis performed recently.  I personally and independently reviewed the scans and discussed the results with the patient and her husband today. Her scan showed no concerning findings for disease progression. I recommended for the patient to continue her current treatment with maintenance Alimta and she will proceed with cycle #76 today. For the recent urinary tract infection, she is feeling much better. For the chemotherapy-induced anemia, we will continue to monitor her closely. The patient will come back for follow-up visit in 3 weeks for evaluation before the next cycle of her treatment. She was advised to call immediately if she has any concerning symptoms in the interval. The patient voices understanding of current disease status and treatment options and is in agreement with the current care plan. All questions were answered. The patient knows to call the clinic with any problems, questions or concerns. We can certainly see the patient much sooner if necessary.  Disclaimer: This note was dictated with voice recognition software. Similar sounding words can inadvertently be transcribed and may not be corrected upon review.

## 2018-10-08 NOTE — Telephone Encounter (Signed)
Scheduled appt per 02/10 los.  Printed calendar and avs.

## 2018-10-20 DIAGNOSIS — J019 Acute sinusitis, unspecified: Secondary | ICD-10-CM | POA: Diagnosis not present

## 2018-10-20 DIAGNOSIS — H6123 Impacted cerumen, bilateral: Secondary | ICD-10-CM | POA: Diagnosis not present

## 2018-10-29 ENCOUNTER — Telehealth: Payer: Self-pay | Admitting: Internal Medicine

## 2018-10-29 ENCOUNTER — Encounter: Payer: Self-pay | Admitting: Internal Medicine

## 2018-10-29 ENCOUNTER — Inpatient Hospital Stay: Payer: Medicare Other | Attending: Internal Medicine

## 2018-10-29 ENCOUNTER — Inpatient Hospital Stay (HOSPITAL_BASED_OUTPATIENT_CLINIC_OR_DEPARTMENT_OTHER): Payer: Medicare Other | Admitting: Internal Medicine

## 2018-10-29 ENCOUNTER — Inpatient Hospital Stay: Payer: Medicare Other

## 2018-10-29 VITALS — BP 115/57 | HR 83 | Temp 97.6°F | Resp 18 | Ht 64.0 in | Wt 184.5 lb

## 2018-10-29 DIAGNOSIS — D6481 Anemia due to antineoplastic chemotherapy: Secondary | ICD-10-CM | POA: Diagnosis not present

## 2018-10-29 DIAGNOSIS — F419 Anxiety disorder, unspecified: Secondary | ICD-10-CM

## 2018-10-29 DIAGNOSIS — C3411 Malignant neoplasm of upper lobe, right bronchus or lung: Secondary | ICD-10-CM | POA: Insufficient documentation

## 2018-10-29 DIAGNOSIS — T451X5A Adverse effect of antineoplastic and immunosuppressive drugs, initial encounter: Secondary | ICD-10-CM

## 2018-10-29 DIAGNOSIS — Z8541 Personal history of malignant neoplasm of cervix uteri: Secondary | ICD-10-CM | POA: Diagnosis not present

## 2018-10-29 DIAGNOSIS — Z5111 Encounter for antineoplastic chemotherapy: Secondary | ICD-10-CM | POA: Insufficient documentation

## 2018-10-29 DIAGNOSIS — T451X5D Adverse effect of antineoplastic and immunosuppressive drugs, subsequent encounter: Secondary | ICD-10-CM | POA: Diagnosis not present

## 2018-10-29 DIAGNOSIS — Z79899 Other long term (current) drug therapy: Secondary | ICD-10-CM

## 2018-10-29 LAB — CBC WITH DIFFERENTIAL/PLATELET
Abs Immature Granulocytes: 0.16 10*3/uL — ABNORMAL HIGH (ref 0.00–0.07)
Basophils Absolute: 0 10*3/uL (ref 0.0–0.1)
Basophils Relative: 0 %
Eosinophils Absolute: 0 10*3/uL (ref 0.0–0.5)
Eosinophils Relative: 0 %
HCT: 37.6 % (ref 36.0–46.0)
Hemoglobin: 11.6 g/dL — ABNORMAL LOW (ref 12.0–15.0)
Immature Granulocytes: 1 %
Lymphocytes Relative: 3 %
Lymphs Abs: 0.4 10*3/uL — ABNORMAL LOW (ref 0.7–4.0)
MCH: 31.1 pg (ref 26.0–34.0)
MCHC: 30.9 g/dL (ref 30.0–36.0)
MCV: 100.8 fL — ABNORMAL HIGH (ref 80.0–100.0)
Monocytes Absolute: 0.9 10*3/uL (ref 0.1–1.0)
Monocytes Relative: 7 %
Neutro Abs: 11.1 10*3/uL — ABNORMAL HIGH (ref 1.7–7.7)
Neutrophils Relative %: 89 %
Platelets: 352 10*3/uL (ref 150–400)
RBC: 3.73 MIL/uL — ABNORMAL LOW (ref 3.87–5.11)
RDW: 16 % — ABNORMAL HIGH (ref 11.5–15.5)
WBC: 12.5 10*3/uL — ABNORMAL HIGH (ref 4.0–10.5)
nRBC: 0 % (ref 0.0–0.2)

## 2018-10-29 LAB — COMPREHENSIVE METABOLIC PANEL
ALT: 16 U/L (ref 0–44)
AST: 13 U/L — ABNORMAL LOW (ref 15–41)
Albumin: 3.3 g/dL — ABNORMAL LOW (ref 3.5–5.0)
Alkaline Phosphatase: 99 U/L (ref 38–126)
Anion gap: 12 (ref 5–15)
BUN: 13 mg/dL (ref 8–23)
CO2: 21 mmol/L — ABNORMAL LOW (ref 22–32)
Calcium: 9.2 mg/dL (ref 8.9–10.3)
Chloride: 108 mmol/L (ref 98–111)
Creatinine, Ser: 0.8 mg/dL (ref 0.44–1.00)
GFR calc Af Amer: >60 mL/min (ref >60)
GFR calc non Af Amer: >60 mL/min (ref >60)
Glucose, Bld: 166 mg/dL — ABNORMAL HIGH (ref 70–99)
Potassium: 4.2 mmol/L (ref 3.5–5.1)
Sodium: 141 mmol/L (ref 135–145)
Total Bilirubin: 0.3 mg/dL (ref 0.3–1.2)
Total Protein: 6.8 g/dL (ref 6.5–8.1)

## 2018-10-29 MED ORDER — SODIUM CHLORIDE 0.9 % IV SOLN
Freq: Once | INTRAVENOUS | Status: AC
  Administered 2018-10-29: 10:00:00 via INTRAVENOUS
  Filled 2018-10-29: qty 250

## 2018-10-29 MED ORDER — DEXAMETHASONE SODIUM PHOSPHATE 10 MG/ML IJ SOLN
INTRAMUSCULAR | Status: AC
  Filled 2018-10-29: qty 1

## 2018-10-29 MED ORDER — SODIUM CHLORIDE 0.9 % IV SOLN
500.0000 mg/m2 | Freq: Once | INTRAVENOUS | Status: AC
  Administered 2018-10-29: 900 mg via INTRAVENOUS
  Filled 2018-10-29: qty 20

## 2018-10-29 MED ORDER — CYANOCOBALAMIN 1000 MCG/ML IJ SOLN
INTRAMUSCULAR | Status: AC
  Filled 2018-10-29: qty 1

## 2018-10-29 MED ORDER — ONDANSETRON HCL 8 MG PO TABS: 8.0000 mg | ORAL_TABLET | Freq: Once | ORAL | Status: DC

## 2018-10-29 MED ORDER — DEXAMETHASONE SODIUM PHOSPHATE 10 MG/ML IJ SOLN
10.0000 mg | Freq: Once | INTRAMUSCULAR | Status: AC
  Administered 2018-10-29: 10 mg via INTRAVENOUS

## 2018-10-29 MED ORDER — ALPRAZOLAM 0.25 MG PO TABS: 0.2500 mg | ORAL_TABLET | Freq: Three times a day (TID) | ORAL | 0 refills | Status: DC | PRN

## 2018-10-29 MED ORDER — CYANOCOBALAMIN 1000 MCG/ML IJ SOLN
1000.0000 ug | Freq: Once | INTRAMUSCULAR | Status: AC
  Administered 2018-10-29: 1000 ug via INTRAMUSCULAR

## 2018-10-29 NOTE — Telephone Encounter (Signed)
Added additional cycle per 3/2 los - pt to get an updated schedule next visit.

## 2018-10-29 NOTE — Patient Instructions (Signed)
Alex[facility] [name] Discharge Instructions for Patients Receiving Chemotherapy  Today you received the following chemotherapy agents :  Alimta.  To help prevent nausea and vomiting after your treatment, we encourage you to take your nausea medication as prescribed.   If you develop nausea and vomiting that is not controlled by your nausea medication, call the clinic.   BELOW ARE SYMPTOMS THAT SHOULD BE REPORTED IMMEDIATELY:  *FEVER GREATER THAN 100.5 F  *CHILLS WITH OR WITHOUT FEVER  NAUSEA AND VOMITING THAT IS NOT CONTROLLED WITH YOUR NAUSEA MEDICATION  *UNUSUAL SHORTNESS OF BREATH  *UNUSUAL BRUISING OR BLEEDING  TENDERNESS IN MOUTH AND THROAT WITH OR WITHOUT PRESENCE OF ULCERS  *URINARY PROBLEMS  *BOWEL PROBLEMS  UNUSUAL RASH Items with * indicate a potential emergency and should be followed up as soon as possible.  Feel free to call the clinic should you have any questions or concerns. The clinic phone number is [phone].  Please show the [facility] at check-in to the Emergency Department and triage nurse.

## 2018-10-29 NOTE — Progress Notes (Signed)
[place] [phone]   [phone]  OFFICE PROGRESS NOTE  [name], MD [address] [place] [address]  DIAGNOSIS: Stage IV (T2a, N0, M1b) non-small cell lung cancer, adenocarcinoma with negative EGFR and ALK mutations diagnosed in January 2015 and presented with right upper lobe lung mass in addition to a solitary brain metastasis.  PRIOR THERAPY: 1) Status post stereotactic radiotherapy to a solitary right parietal brain lesion under the care of Dr. [name] on 10/16/2013. 2) Status post palliative radiotherapy to the right lung tumor under the care of Dr. [name] completed on 12/05/2013. 3) Systemic chemotherapy with carboplatin for AUC of 5 and Alimta 500 mg/M2 every 3 weeks. First dose Jan 06 2014. Status post 6 cycles.  CURRENT THERAPY: Systemic chemotherapy with maintenance Alimta 500 MG/M2 every 3 weeks, status post 76 cycles.  INTERVAL HISTORY: Alexis [name] 68 y.o. female returns to the clinic today for follow-up visit.  The patient is feeling fine today with no concerning complaints.  Her husband did not come with her today because he is suffering from flulike symptoms.  The patient denied having any chest pain, shortness of breath, cough or hemoptysis.  She has no weight loss or night sweats.  She has no nausea, vomiting, diarrhea or constipation.  She continues to tolerate her maintenance treatment with Alimta fairly well.  She is here for evaluation before starting cycle #77.  MEDICAL HISTORY: Past Medical History:  Diagnosis Date  . Anxiety   . Anxiety 06/20/2016  . Cervical cancer ([place])   . Encounter for antineoplastic chemotherapy 07/20/2015  . 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  MEDICATIONS:  Current Outpatient Medications  Medication Sig Dispense Refill  . acetaminophen (TYLENOL) 500 MG tablet Take  500 mg by mouth every 6 (six) hours as needed for mild pain or headache. Reported on 11/02/2015    . ALPRAZolam (XANAX) 0.25 MG tablet TAKE 1 TABLET BY MOUTH 3 TIMES A DAY AS NEEDED 30 tablet 0  . Ascorbic Acid (VITAMIN C GUMMIE PO) Take 1 each by mouth every morning.    Marland Kitchen dexamethasone (DECADRON) 4 MG tablet TAKE 1 TABLET BY MOUTH TWICE A DAY THE DAY BEFORE, DAY OF AND DAY AFTER THE CHEMOTHERAPY EVERY 3 WKS 40 tablet 1  . folic acid (FOLVITE) 1 MG tablet TAKE 1 TABLET BY MOUTH EVERY DAY 90 tablet 0  . Multiple Vitamin (MULTIVITAMIN WITH MINERALS) TABS tablet Take 1 tablet by mouth every morning.    Marland Kitchen omeprazole (PRILOSEC) 20 MG capsule Take 1 capsule (20 mg total) by mouth daily. As needed for reflux or indigestion. 42 capsule PRN  . ondansetron (ZOFRAN) 8 MG tablet Take one tab po before chemo. 30 tablet 0  . OVER THE COUNTER MEDICATION Take 1 tablet by mouth every morning. (Vitamin A)    . phenazopyridine (PYRIDIUM) 200 MG tablet Take 1 tablet (200 mg total) by mouth 3 (three) times daily as needed for pain. 15 tablet 0  . prochlorperazine (COMPAZINE) 10 MG tablet Take 1 tablet (10 mg total) by mouth every 6 (six) hours as needed for nausea or vomiting. 60 tablet 0  . senna-docusate (SENOKOT-S) 8.6-50 MG tablet Take 1 tablet by mouth daily. 30 tablet prn  . sulfamethoxazole-trimethoprim (BACTRIM DS,SEPTRA DS) 800-160 MG tablet Take 1 tablet by mouth 2 (  two) times daily. 14 tablet 0   No current facility-administered medications for this visit.     SURGICAL HISTORY:  Past Surgical History:  Procedure Laterality Date  . ABDOMINAL HYSTERECTOMY    . COLOSTOMY TAKEDOWN N/A 07/10/2014   Procedure: LAPAROSCOPIC LYSIS OF ADHESIONS (90 MIN) LAPAROSCOPIC ASSISTED COLOSTOMY CLOSURE, RIGID PROCTOSIGMOIDOSCOPY;  Surgeon: Jackolyn Confer, MD;  Location: [facility];  Service: General;  Laterality: N/A;  . LAPAROTOMY N/A 11/03/2013   Procedure: EXPLORATORY LAPAROTOMY, DRAINAGE OF INTRA  ABDOMINAL ABSCESSES,  MOBILIZATION OF SPLENIC FLEXURE, SIGMOID COLECTOMY WITH COLOSTOMY;  Surgeon: Odis Hollingshead, MD;  Location: [facility];  Service: General;  Laterality: N/A;  . VIDEO BRONCHOSCOPY Bilateral 08/30/2013   Procedure: VIDEO BRONCHOSCOPY WITH FLUORO;  Surgeon: Tanda Rockers, MD;  Location: [facility];  Service: Cardiopulmonary;  Laterality: Bilateral;    REVIEW OF SYSTEMS:  A comprehensive review of systems was negative.   PHYSICAL EXAMINATION: General appearance: alert, cooperative and no distress Head: Normocephalic, without obvious abnormality, atraumatic Neck: no adenopathy, no JVD, supple, symmetrical, trachea midline and thyroid not enlarged, symmetric, no tenderness/mass/nodules Lymph nodes: Cervical, supraclavicular, and axillary nodes normal. Resp: clear to auscultation bilaterally Back: symmetric, no curvature. ROM normal. No CVA tenderness. Cardio: regular rate and rhythm, S1, S2 normal, no murmur, click, rub or gallop GI: soft, non-tender; bowel sounds normal; no masses,  no organomegaly Extremities: extremities normal, atraumatic, no cyanosis or edema  ECOG PERFORMANCE STATUS: 0 - Asymptomatic  Blood pressure (!) 115/57, pulse 83, temperature 97.6 F (36.4 C), temperature source Oral, resp. rate 18, height _0  (1.626 m), weight 184 lb 8 oz (83.7 kg), SpO2 98 %.  LABORATORY DATA: Lab Results  Component Value Date   WBC 12.5 (H) 10/29/2018   HGB 11.6 (L) 10/29/2018   HCT 37.6 10/29/2018   MCV 100.8 (H) 10/29/2018   PLT 352 10/29/2018      Chemistry      Component Value Date/Time   NA 138 10/08/2018 0910   NA 139 08/14/2017 0837   K 4.5 10/08/2018 0910   K 4.4 08/14/2017 0837   CL 106 10/08/2018 0910   CO2 22 10/08/2018 0910   CO2 24 08/14/2017 0837   BUN 14 10/08/2018 0910   BUN 13.3 08/14/2017 0837   CREATININE 0.89 10/08/2018 0910   CREATININE 0.75 10/02/2018 1212   CREATININE 0.8 08/14/2017 0837      Component Value Date/Time   CALCIUM 9.6 10/08/2018 0910    CALCIUM 9.3 08/14/2017 0837   ALKPHOS 96 10/08/2018 0910   ALKPHOS 107 08/14/2017 0837   AST 12 (L) 10/08/2018 0910   AST 12 (L) 10/02/2018 1212   AST 12 08/14/2017 0837   ALT 11 10/08/2018 0910   ALT 11 10/02/2018 1212   ALT 12 08/14/2017 0837   BILITOT 0.6 10/08/2018 0910   BILITOT 0.5 10/02/2018 1212   BILITOT 0.37 08/14/2017 0837       RADIOGRAPHIC STUDIES: Ct Chest W Contrast  Result Date: 10/05/2018 CLINICAL DATA:  Stage IV adenocarcinoma of the right upper lobe. EXAM: CT CHEST, ABDOMEN, AND PELVIS WITH CONTRAST TECHNIQUE: Multidetector CT imaging of the chest, abdomen and pelvis was performed following the standard protocol during bolus administration of intravenous contrast. CONTRAST:  116m OMNIPAQUE IOHEXOL 300 MG/ML  SOLN COMPARISON:  06/21/2018 FINDINGS: CT CHEST FINDINGS Cardiovascular: The heart size is normal. No substantial pericardial effusion. Atherosclerotic calcification is noted in the wall of the thoracic aorta. Mediastinum/Nodes: No mediastinal lymphadenopathy. There is no hilar lymphadenopathy. The esophagus  has normal imaging features. There is no axillary lymphadenopathy. Lungs/Pleura: The central tracheobronchial airways are patent. Centrilobular and paraseptal emphysema again noted. Left apical pleuroparenchymal scarring is stable. The posterior right apical lesion measured previously at 3.7 x 3.1 cm is stable today, measuring 3.6 x 3.0 cm (28/4). No new suspicious nodule or mass. Several ill-defined nodular ground-glass opacities are seen in the medial right lower lobe measuring up to about 7 mm (94/4). These are probably related to atelectasis although infectious/inflammatory alveolitis is a consideration. Musculoskeletal: Unremarkable. CT ABDOMEN PELVIS FINDINGS Hepatobiliary: No suspicious focal abnormality within the liver parenchyma. There is no evidence for gallstones, gallbladder wall thickening, or pericholecystic fluid. No intrahepatic or extrahepatic biliary  dilation. Pancreas: No focal mass lesion. No dilatation of the main duct. No intraparenchymal cyst. No peripancreatic edema. Spleen: No splenomegaly. No focal mass lesion. Adrenals/Urinary Tract: No adrenal nodule or mass. Kidneys unremarkable. No evidence for hydroureter. The urinary bladder appears normal for the degree of distention. Stomach/Bowel: Stomach is unremarkable. No gastric wall thickening. No evidence of outlet obstruction. Duodenum is normally positioned as is the ligament of Treitz. No small bowel wall thickening. No small bowel dilatation. The terminal ileum is normal. The appendix is normal. No gross colonic mass. No colonic wall thickening. Anastomotic staple line noted distal sigmoid colon/rectosigmoid junction. Vascular/Lymphatic: There is abdominal aortic atherosclerosis without aneurysm. There is no gastrohepatic or hepatoduodenal ligament lymphadenopathy. No intraperitoneal or retroperitoneal lymphadenopathy. No pelvic sidewall lymphadenopathy. Reproductive: The uterus is surgically absent. There is no adnexal mass. Other: No intraperitoneal free fluid. Musculoskeletal: No worrisome lytic or sclerotic osseous abnormality. Right paraumbilical hernia contains only fat. Small fat containing umbilical hernia evident. IMPRESSION: 1. Stable appearance posterior right apical lung lesion. 2. New small ground-glass opacities in the medial right lower lobe likely reflect atelectasis or infectious/inflammatory alveolitis. Attention on follow-up recommended. 3. No metastatic disease evident in the abdomen or pelvis. 4.  Aortic Atherosclerois (ICD10-170.0) 5.  Emphysema. ([id number]) Electronically Signed   By: Misty Stanley M.D.   On: 10/05/2018 13:47   Ct Abdomen Pelvis W Contrast  Result Date: 10/05/2018 CLINICAL DATA:  Stage IV adenocarcinoma of the right upper lobe. EXAM: CT CHEST, ABDOMEN, AND PELVIS WITH CONTRAST TECHNIQUE: Multidetector CT imaging of the chest, abdomen and pelvis was performed  following the standard protocol during bolus administration of intravenous contrast. CONTRAST:  137m OMNIPAQUE IOHEXOL 300 MG/ML  SOLN COMPARISON:  06/21/2018 FINDINGS: CT CHEST FINDINGS Cardiovascular: The heart size is normal. No substantial pericardial effusion. Atherosclerotic calcification is noted in the wall of the thoracic aorta. Mediastinum/Nodes: No mediastinal lymphadenopathy. There is no hilar lymphadenopathy. The esophagus has normal imaging features. There is no axillary lymphadenopathy. Lungs/Pleura: The central tracheobronchial airways are patent. Centrilobular and paraseptal emphysema again noted. Left apical pleuroparenchymal scarring is stable. The posterior right apical lesion measured previously at 3.7 x 3.1 cm is stable today, measuring 3.6 x 3.0 cm (28/4). No new suspicious nodule or mass. Several ill-defined nodular ground-glass opacities are seen in the medial right lower lobe measuring up to about 7 mm (94/4). These are probably related to atelectasis although infectious/inflammatory alveolitis is a consideration. Musculoskeletal: Unremarkable. CT ABDOMEN PELVIS FINDINGS Hepatobiliary: No suspicious focal abnormality within the liver parenchyma. There is no evidence for gallstones, gallbladder wall thickening, or pericholecystic fluid. No intrahepatic or extrahepatic biliary dilation. Pancreas: No focal mass lesion. No dilatation of the main duct. No intraparenchymal cyst. No peripancreatic edema. Spleen: No splenomegaly. No focal mass lesion. Adrenals/Urinary Tract: No adrenal nodule or  mass. Kidneys unremarkable. No evidence for hydroureter. The urinary bladder appears normal for the degree of distention. Stomach/Bowel: Stomach is unremarkable. No gastric wall thickening. No evidence of outlet obstruction. Duodenum is normally positioned as is the ligament of Treitz. No small bowel wall thickening. No small bowel dilatation. The terminal ileum is normal. The appendix is normal. No gross  colonic mass. No colonic wall thickening. Anastomotic staple line noted distal sigmoid colon/rectosigmoid junction. Vascular/Lymphatic: There is abdominal aortic atherosclerosis without aneurysm. There is no gastrohepatic or hepatoduodenal ligament lymphadenopathy. No intraperitoneal or retroperitoneal lymphadenopathy. No pelvic sidewall lymphadenopathy. Reproductive: The uterus is surgically absent. There is no adnexal mass. Other: No intraperitoneal free fluid. Musculoskeletal: No worrisome lytic or sclerotic osseous abnormality. Right paraumbilical hernia contains only fat. Small fat containing umbilical hernia evident. IMPRESSION: 1. Stable appearance posterior right apical lung lesion. 2. New small ground-glass opacities in the medial right lower lobe likely reflect atelectasis or infectious/inflammatory alveolitis. Attention on follow-up recommended. 3. No metastatic disease evident in the abdomen or pelvis. 4.  Aortic Atherosclerois (ICD10-170.0) 5.  Emphysema. ([id number]) Electronically Signed   By: Misty Stanley M.D.   On: 10/05/2018 13:47    ASSESSMENT AND PLAN:  This is a very pleasant 68 years old white female with metastatic non-small cell lung cancer, adenocarcinoma status post induction systemic chemotherapy with carboplatin and Alimta with partial response. The patient is currently on maintenance treatment with single agent Alimta status post 76 cycles. The patient has been tolerating this treatment well with no concerning complaints. I recommended for her to proceed with cycle #77 today as scheduled. For the chemotherapy-induced anemia, we will continue to monitor her closely. For the anxiety, I will give her a refill of Xanax. She will come back for follow-up visit in 3 weeks for evaluation before the next cycle of her treatment. The patient was advised to call immediately if she has any concerning symptoms in the interval. The patient voices understanding of current disease status  and treatment options and is in agreement with the current care plan. All questions were answered. The patient knows to call the clinic with any problems, questions or concerns. We can certainly see the patient much sooner if necessary.  Disclaimer: This note was dictated with voice recognition software. Similar sounding words can inadvertently be transcribed and may not be corrected upon review.

## 2018-11-03 ENCOUNTER — Other Ambulatory Visit: Payer: Self-pay | Admitting: Internal Medicine

## 2018-11-03 DIAGNOSIS — C7931 Secondary malignant neoplasm of brain: Secondary | ICD-10-CM

## 2018-11-03 DIAGNOSIS — C7949 Secondary malignant neoplasm of other parts of nervous system: Secondary | ICD-10-CM

## 2018-11-03 DIAGNOSIS — C3411 Malignant neoplasm of upper lobe, right bronchus or lung: Secondary | ICD-10-CM

## 2018-11-12 ENCOUNTER — Other Ambulatory Visit: Payer: Self-pay | Admitting: Internal Medicine

## 2018-11-12 DIAGNOSIS — C3490 Malignant neoplasm of unspecified part of unspecified bronchus or lung: Secondary | ICD-10-CM

## 2018-11-19 ENCOUNTER — Encounter: Payer: Self-pay | Admitting: Physician Assistant

## 2018-11-19 ENCOUNTER — Inpatient Hospital Stay: Payer: Medicare Other

## 2018-11-19 ENCOUNTER — Other Ambulatory Visit: Payer: Self-pay

## 2018-11-19 ENCOUNTER — Inpatient Hospital Stay (HOSPITAL_BASED_OUTPATIENT_CLINIC_OR_DEPARTMENT_OTHER): Payer: Medicare Other | Admitting: Physician Assistant

## 2018-11-19 ENCOUNTER — Telehealth: Payer: Self-pay | Admitting: Physician Assistant

## 2018-11-19 VITALS — BP 115/61 | HR 83 | Temp 98.8°F | Resp 18 | Ht 64.0 in | Wt 185.8 lb

## 2018-11-19 DIAGNOSIS — Z79899 Other long term (current) drug therapy: Secondary | ICD-10-CM | POA: Diagnosis not present

## 2018-11-19 DIAGNOSIS — F419 Anxiety disorder, unspecified: Secondary | ICD-10-CM

## 2018-11-19 DIAGNOSIS — Z8541 Personal history of malignant neoplasm of cervix uteri: Secondary | ICD-10-CM

## 2018-11-19 DIAGNOSIS — Z5111 Encounter for antineoplastic chemotherapy: Secondary | ICD-10-CM

## 2018-11-19 DIAGNOSIS — C3411 Malignant neoplasm of upper lobe, right bronchus or lung: Secondary | ICD-10-CM

## 2018-11-19 DIAGNOSIS — T451X5D Adverse effect of antineoplastic and immunosuppressive drugs, subsequent encounter: Secondary | ICD-10-CM | POA: Diagnosis not present

## 2018-11-19 DIAGNOSIS — D6481 Anemia due to antineoplastic chemotherapy: Secondary | ICD-10-CM | POA: Diagnosis not present

## 2018-11-19 LAB — CBC WITH DIFFERENTIAL/PLATELET
Abs Immature Granulocytes: 0.25 10*3/uL — ABNORMAL HIGH (ref 0.00–0.07)
Basophils Absolute: 0 10*3/uL (ref 0.0–0.1)
Basophils Relative: 0 %
Eosinophils Absolute: 0 10*3/uL (ref 0.0–0.5)
Eosinophils Relative: 0 %
HCT: 39.7 % (ref 36.0–46.0)
Hemoglobin: 12.4 g/dL (ref 12.0–15.0)
Immature Granulocytes: 2 %
Lymphocytes Relative: 4 %
Lymphs Abs: 0.5 10*3/uL — ABNORMAL LOW (ref 0.7–4.0)
MCH: 31.5 pg (ref 26.0–34.0)
MCHC: 31.2 g/dL (ref 30.0–36.0)
MCV: 100.8 fL — ABNORMAL HIGH (ref 80.0–100.0)
Monocytes Absolute: 0.7 10*3/uL (ref 0.1–1.0)
Monocytes Relative: 5 %
Neutro Abs: 12.6 10*3/uL — ABNORMAL HIGH (ref 1.7–7.7)
Neutrophils Relative %: 89 %
Platelets: 297 10*3/uL (ref 150–400)
RBC: 3.94 MIL/uL (ref 3.87–5.11)
RDW: 15.6 % — ABNORMAL HIGH (ref 11.5–15.5)
WBC: 14.2 10*3/uL — ABNORMAL HIGH (ref 4.0–10.5)
nRBC: 0 % (ref 0.0–0.2)

## 2018-11-19 LAB — COMPREHENSIVE METABOLIC PANEL
ALT: 15 U/L (ref 0–44)
AST: 12 U/L — ABNORMAL LOW (ref 15–41)
Albumin: 3.5 g/dL (ref 3.5–5.0)
Alkaline Phosphatase: 87 U/L (ref 38–126)
Anion gap: 12 (ref 5–15)
BUN: 10 mg/dL (ref 8–23)
CO2: 21 mmol/L — ABNORMAL LOW (ref 22–32)
Calcium: 9.2 mg/dL (ref 8.9–10.3)
Chloride: 108 mmol/L (ref 98–111)
Creatinine, Ser: 0.76 mg/dL (ref 0.44–1.00)
GFR calc Af Amer: >60 mL/min (ref >60)
GFR calc non Af Amer: >60 mL/min (ref >60)
Glucose, Bld: 155 mg/dL — ABNORMAL HIGH (ref 70–99)
Potassium: 4.1 mmol/L (ref 3.5–5.1)
Sodium: 141 mmol/L (ref 135–145)
Total Bilirubin: 0.4 mg/dL (ref 0.3–1.2)
Total Protein: 6.9 g/dL (ref 6.5–8.1)

## 2018-11-19 MED ORDER — SODIUM CHLORIDE 0.9 % IV SOLN
500.0000 mg/m2 | Freq: Once | INTRAVENOUS | Status: AC
  Administered 2018-11-19: 900 mg via INTRAVENOUS
  Filled 2018-11-19: qty 20

## 2018-11-19 MED ORDER — DEXAMETHASONE SODIUM PHOSPHATE 10 MG/ML IJ SOLN
INTRAMUSCULAR | Status: AC
  Filled 2018-11-19: qty 1

## 2018-11-19 MED ORDER — SODIUM CHLORIDE 0.9 % IV SOLN
Freq: Once | INTRAVENOUS | Status: AC
  Administered 2018-11-19: 11:00:00 via INTRAVENOUS
  Filled 2018-11-19: qty 250

## 2018-11-19 MED ORDER — ONDANSETRON HCL 8 MG PO TABS: 8.0000 mg | ORAL_TABLET | Freq: Once | ORAL | Status: DC

## 2018-11-19 MED ORDER — DEXAMETHASONE SODIUM PHOSPHATE 10 MG/ML IJ SOLN
10.0000 mg | Freq: Once | INTRAMUSCULAR | Status: AC
  Administered 2018-11-19: 10 mg via INTRAVENOUS

## 2018-11-19 NOTE — Progress Notes (Signed)
[facility] OFFICE PROGRESS NOTE  [name], MD [address] [place] [address]  DIAGNOSIS: Stage IV (T2a, N0, M1b) non-small cell lung cancer, adenocarcinoma with negative EGFR and ALK mutations diagnosed in January 2015 and presented with right upper lobe lung mass in addition to a solitary brain metastasis.  PRIOR THERAPY: 1) Status post stereotactic radiotherapy to a solitary right parietal brain lesion under the care of Dr. [name] on 10/16/2013. 2) Status post palliative radiotherapy to the right lung tumor under the care of Dr. [name] completed on 12/05/2013. 3) Systemic chemotherapy with carboplatin for AUC of 5 and Alimta 500 mg/M2 every 3 weeks. First dose Jan 06 2014. Status post 6 cycles.  CURRENT THERAPY: Systemic chemotherapy with maintenance Alimta 500 MG/M2 every 3 weeks, status post 77 cycles.  INTERVAL HISTORY: Alexis [name] 68 y.o. female returns to the clinic for a follow-up visit.  The patient is feeling well today without any concerning complaints. She denies any fever, chills, night sweats, or weight loss. She denies any chest pain, shortness of breath, cough, or hemoptysis. She denies any nausea, vomiting, diarrhea, or constipation.  She denies any headache or visual changes.  She denies any rashes or skin changes. She continues to tolerate her maintenance Alimta well without any adverse side effects.  She is here today for evaluation prior to starting cycle #78.   MEDICAL HISTORY: Past Medical History:  Diagnosis Date  . Anxiety   . Anxiety 06/20/2016  . Cervical cancer ([facility])   . Encounter for antineoplastic chemotherapy 07/20/2015  . 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  MEDICATIONS:  Current Outpatient Medications  Medication Sig Dispense Refill  . acetaminophen (TYLENOL) 500 MG tablet Take 500 mg by mouth every 6 (six)  hours as needed for mild pain or headache. Reported on 11/02/2015    . ALPRAZolam (XANAX) 0.25 MG tablet Take 1 tablet (0.25 mg total) by mouth 3 (three) times daily as needed. 30 tablet 0  . Ascorbic Acid (VITAMIN C GUMMIE PO) Take 1 each by mouth every morning.    Marland Kitchen dexamethasone (DECADRON) 4 MG tablet TAKE 1 TABLET BY MOUTH TWICE A DAY THE DAY BEFORE, DAY OF AND DAY AFTER THE CHEMOTHERAPY EVERY 3 WKS 40 tablet 1  . folic acid (FOLVITE) 1 MG tablet TAKE 1 TABLET BY MOUTH EVERY DAY 90 tablet 0  . Multiple Vitamin (MULTIVITAMIN WITH MINERALS) TABS tablet Take 1 tablet by mouth every morning.    Marland Kitchen omeprazole (PRILOSEC) 20 MG capsule Take 1 capsule (20 mg total) by mouth daily. As needed for reflux or indigestion. 42 capsule PRN  . ondansetron (ZOFRAN) 8 MG tablet Take one tab po before chemo. 30 tablet 0  . OVER THE COUNTER MEDICATION Take 1 tablet by mouth every morning. (Vitamin A)    . phenazopyridine (PYRIDIUM) 200 MG tablet Take 1 tablet (200 mg total) by mouth 3 (three) times daily as needed for pain. 15 tablet 0  . prochlorperazine (COMPAZINE) 10 MG tablet Take 1 tablet (10 mg total) by mouth every 6 (six) hours as needed for nausea or vomiting. 60 tablet 0  . senna-docusate (SENOKOT-S) 8.6-50 MG tablet Take 1 tablet by mouth daily. 30 tablet prn  . sulfamethoxazole-trimethoprim (BACTRIM DS,SEPTRA DS) 800-160 MG tablet Take 1 tablet by mouth 2 (two) times daily. 14 tablet 0  No current facility-administered medications for this visit.    Facility-Administered Medications Ordered in Other Visits  Medication Dose Route Frequency Provider Last Rate Last Dose  . ondansetron (ZOFRAN) tablet 8 mg  8 mg Oral Once Curt Bears, MD        SURGICAL HISTORY:  Past Surgical History:  Procedure Laterality Date  . ABDOMINAL HYSTERECTOMY    . COLOSTOMY TAKEDOWN N/A 07/10/2014   Procedure: LAPAROSCOPIC LYSIS OF ADHESIONS (90 MIN) LAPAROSCOPIC ASSISTED COLOSTOMY CLOSURE, RIGID PROCTOSIGMOIDOSCOPY;   Surgeon: Jackolyn Confer, MD;  Location: [facility];  Service: General;  Laterality: N/A;  . LAPAROTOMY N/A 11/03/2013   Procedure: EXPLORATORY LAPAROTOMY, DRAINAGE OF INTRA  ABDOMINAL ABSCESSES, MOBILIZATION OF SPLENIC FLEXURE, SIGMOID COLECTOMY WITH COLOSTOMY;  Surgeon: Odis Hollingshead, MD;  Location: [facility];  Service: General;  Laterality: N/A;  . VIDEO BRONCHOSCOPY Bilateral 08/30/2013   Procedure: VIDEO BRONCHOSCOPY WITH FLUORO;  Surgeon: Tanda Rockers, MD;  Location: [facility];  Service: Cardiopulmonary;  Laterality: Bilateral;    REVIEW OF SYSTEMS:   Review of Systems  Constitutional: Negative for appetite change, chills, fatigue, fever and unexpected weight change.  HENT:   Negative for mouth sores, nosebleeds, sore throat and trouble swallowing.   Eyes: Negative for eye problems and icterus.  Respiratory: Negative for cough, hemoptysis, shortness of breath and wheezing.   Cardiovascular: Negative for chest pain and leg swelling.  Gastrointestinal: Negative for abdominal pain, constipation, diarrhea, nausea and vomiting.  Genitourinary: Negative for bladder incontinence, difficulty urinating, dysuria, frequency and hematuria.   Musculoskeletal: Negative for back pain, gait problem, neck pain and neck stiffness.  Skin: Negative for itching and rash.  Neurological: Negative for dizziness, extremity weakness, gait problem, headaches, light-headedness and seizures.  Hematological: Negative for adenopathy. Does not bruise/bleed easily.  Psychiatric/Behavioral: Negative for confusion, depression and sleep disturbance. The patient is not nervous/anxious.     PHYSICAL EXAMINATION:  Blood pressure 115/61, pulse 83, temperature 98.8 F (37.1 C), temperature source Oral, resp. rate 18, height 5' 4"  (1.626 m), weight 185 lb 12.8 oz (84.3 kg), SpO2 100 %.  ECOG PERFORMANCE STATUS: 1 - Symptomatic but completely ambulatory  Physical Exam  Constitutional: Oriented to person, place, and time and  well-developed, well-nourished, and in no distress. No distress.  HENT:  Head: Normocephalic and atraumatic.  Mouth/Throat: Oropharynx is clear and moist. No oropharyngeal exudate.  Eyes: Conjunctivae are normal. Right eye exhibits no discharge. Left eye exhibits no discharge. No scleral icterus.  Neck: Normal range of motion. Neck supple.  Cardiovascular: Normal rate, regular rhythm, normal heart sounds and intact distal pulses.   Pulmonary/Chest: Effort normal and breath sounds normal. No respiratory distress. No wheezes. No rales.  Abdominal: Soft. Bowel sounds are normal. Exhibits no distension and no mass. There is no tenderness.  Musculoskeletal: Normal range of motion. Exhibits no edema.  Lymphadenopathy:    No cervical adenopathy.  Neurological: Alert and oriented to person, place, and time. Exhibits normal muscle tone. Gait normal. Coordination normal.  Skin: Skin is warm and dry. No rash noted. Not diaphoretic. No erythema. No pallor.  Psychiatric: Mood, memory and judgment normal.  Vitals reviewed.  LABORATORY DATA: Lab Results  Component Value Date   WBC 14.2 (H) 11/19/2018   HGB 12.4 11/19/2018   HCT 39.7 11/19/2018   MCV 100.8 (H) 11/19/2018   PLT 297 11/19/2018      Chemistry      Component Value Date/Time   NA 141 11/19/2018 0931   NA 139 08/14/2017 0837  K 4.1 11/19/2018 0931   K 4.4 08/14/2017 0837   CL 108 11/19/2018 0931   CO2 21 (L) 11/19/2018 0931   CO2 24 08/14/2017 0837   BUN 10 11/19/2018 0931   BUN 13.3 08/14/2017 0837   CREATININE 0.76 11/19/2018 0931   CREATININE 0.75 10/02/2018 1212   CREATININE 0.8 08/14/2017 0837      Component Value Date/Time   CALCIUM 9.2 11/19/2018 0931   CALCIUM 9.3 08/14/2017 0837   ALKPHOS 87 11/19/2018 0931   ALKPHOS 107 08/14/2017 0837   AST 12 (L) 11/19/2018 0931   AST 12 (L) 10/02/2018 1212   AST 12 08/14/2017 0837   ALT 15 11/19/2018 0931   ALT 11 10/02/2018 1212   ALT 12 08/14/2017 0837   BILITOT 0.4  11/19/2018 0931   BILITOT 0.5 10/02/2018 1212   BILITOT 0.37 08/14/2017 0837       RADIOGRAPHIC STUDIES:  No results found.   ASSESSMENT/PLAN:  This is a very pleasant 68 year old Caucasian female with stage IV non-small cell lung cancer, adenocarcinoma who presented with a right upper lobe lung mass in addition to a solitary brain metastatsis.  She was diagnosed in January 2015. The patient had completed induction systemic chemotherapy with carboplatin and Alimta with a partial response. The patient is currently being treated with single agent maintenance Alimta.  She is status post 77 cycles.  The patient was seen with Dr. Julien Nordmann today.  She continues to tolerate treatment well without any adverse effects. Labs were reviewed with the patient. She has history of chemotherapy induced anemia. her hemoglobin is within normal limits at 12.4 today. We recommend she proceed with cycle #78 today as scheduled.  We will see her back for evaluation in 3 weeks before starting cycle #79.   The patient was advised to call immediately if she has any concerning symptoms in the interval. The patient voices understanding of current disease status and treatment options and is in agreement with the current care plan. All questions were answered. The patient knows to call the clinic with any problems, questions or concerns. We can certainly see the patient much sooner if necessary  No orders of the defined types were placed in this encounter.    Nareg Breighner L Kendale Rembold, PA-C 11/19/18  ADDENDUM: Hematology/Oncology Attending: I had a face-to-face encounter with the patient today.  I recommended her care plan.  This is a very pleasant 68 years old white female with metastatic non-small cell lung cancer, adenocarcinoma status post induction systemic chemotherapy with carboplatin and Alimta and she is currently on maintenance treatment with Alimta status post 77 cycles.  She has been tolerating this  treatment well with no concerning adverse effects. I recommended for her to proceed with cycle #78 today as scheduled. We will see the patient back for follow-up visit in 3 weeks for evaluation before the next cycle of her treatment. She was advised to call immediately if she has any concerning symptoms in the interval.  Disclaimer: This note was dictated with voice recognition software. Similar sounding words can inadvertently be transcribed and may be missed upon review. Eilleen Kempf, MD  11/19/18

## 2018-11-19 NOTE — Telephone Encounter (Signed)
Scheduled appt per 3/23 los.  Printed calendar and avs.

## 2018-11-19 NOTE — Patient Instructions (Signed)
Coronavirus (COVID-19) Are you at risk?  Are you at risk for the Coronavirus (COVID-19)?  To be considered HIGH RISK for Coronavirus (COVID-19), you have to meet the following criteria:  . Traveled to [place], [place], [place], [place] or [place]; or in the [place] to [place], [place], [place], or [place]; and have fever, cough, and shortness of breath within the last 2 weeks of travel OR . Been in close contact with a person diagnosed with COVID-19 within the last 2 weeks and have fever, cough, and shortness of breath . IF YOU DO NOT MEET THESE CRITERIA, YOU ARE CONSIDERED LOW RISK FOR COVID-19.  What to do if you are HIGH RISK for COVID-19?  . If you are having a medical emergency, call 911. . Seek medical care right away. Before you go to a doctor's office, urgent care or emergency department, call ahead and tell them about your recent travel, contact with someone diagnosed with COVID-19, and your symptoms. You should receive instructions from your physician's office regarding next steps of care.  . When you arrive at healthcare provider, tell the healthcare staff immediately you have returned from visiting [place], [place], [place], [place] or [place]; or traveled in the [place] to [place], [place], [place], or [place]; in the last two weeks or you have been in close contact with a person diagnosed with COVID-19 in the last 2 weeks.   . Tell the health care staff about your symptoms: fever, cough and shortness of breath. . After you have been seen by a medical provider, you will be either: o Tested for (COVID-19) and discharged home on quarantine except to seek medical care if symptoms worsen, and asked to  - Stay home and avoid contact with others until you get your results (4-5 days)  - Avoid travel on public transportation if possible (such as bus, train, or airplane) or o Sent to the Emergency Department by EMS for evaluation, COVID-19 testing, and possible  admission depending on your condition and test results.  What to do if you are LOW RISK for COVID-19?  Reduce your risk of any infection by using the same precautions used for avoiding the common cold or flu:  . Wash your hands often with soap and warm water for at least 20 seconds.  If soap and water are not readily available, use an alcohol-based hand sanitizer with at least 60% alcohol.  . If coughing or sneezing, cover your mouth and nose by coughing or sneezing into the elbow areas of your shirt or coat, into a tissue or into your sleeve (not your hands). . Avoid shaking hands with others and consider head nods or verbal greetings only. . Avoid touching your eyes, nose, or mouth with unwashed hands.  . Avoid close contact with people who are sick. . Avoid places or events with large numbers of people in one location, like concerts or sporting events. . Carefully consider travel plans you have or are making. . If you are planning any travel outside or inside the [place], visit the CDC's Travelers' Health webpage for the latest health notices. . If you have some symptoms but not all symptoms, continue to monitor at home and seek medical attention if your symptoms worsen. . If you are having a medical emergency, call 911.  ADDITIONAL HEALTHCARE OPTIONS FOR PATIENTS  [facility] Telehealth / e-Visit: [email or web address]         [facility]: [phone]  [facility]: [facility]: [facility] Discharge Instructions for Patients Receiving Chemotherapy  Today you received the following chemotherapy agents: Pemetrexed (Alimta)  To help prevent nausea and vomiting after your treatment, we encourage you to take your nausea medication as directed.    If you develop nausea and vomiting that is not controlled by your nausea medication, call the clinic.    BELOW ARE SYMPTOMS THAT SHOULD BE REPORTED IMMEDIATELY:  *FEVER GREATER THAN 100.5 F  *CHILLS WITH OR WITHOUT FEVER  NAUSEA AND VOMITING THAT IS NOT CONTROLLED WITH YOUR NAUSEA MEDICATION  *UNUSUAL SHORTNESS OF BREATH  *UNUSUAL BRUISING OR BLEEDING  TENDERNESS IN MOUTH AND THROAT WITH OR WITHOUT PRESENCE OF ULCERS  *URINARY PROBLEMS  *BOWEL PROBLEMS  UNUSUAL RASH Items with * indicate a potential emergency and should be followed up as soon as possible.  Feel free to call the clinic should you have any questions or concerns. The clinic phone number is [phone].  Please show the [facility] at check-in to the Emergency Department and triage nurse.

## 2018-11-24 ENCOUNTER — Other Ambulatory Visit: Payer: Self-pay | Admitting: Internal Medicine

## 2018-11-24 DIAGNOSIS — C3490 Malignant neoplasm of unspecified part of unspecified bronchus or lung: Secondary | ICD-10-CM

## 2018-11-26 ENCOUNTER — Other Ambulatory Visit: Payer: Self-pay | Admitting: Radiation Therapy

## 2018-11-26 DIAGNOSIS — C7931 Secondary malignant neoplasm of brain: Secondary | ICD-10-CM

## 2018-11-26 DIAGNOSIS — C7949 Secondary malignant neoplasm of other parts of nervous system: Principal | ICD-10-CM

## 2018-12-10 ENCOUNTER — Encounter: Payer: Self-pay | Admitting: Internal Medicine

## 2018-12-10 ENCOUNTER — Other Ambulatory Visit: Payer: Self-pay

## 2018-12-10 ENCOUNTER — Inpatient Hospital Stay: Payer: Medicare Other

## 2018-12-10 ENCOUNTER — Inpatient Hospital Stay (HOSPITAL_BASED_OUTPATIENT_CLINIC_OR_DEPARTMENT_OTHER): Payer: Medicare Other | Admitting: Internal Medicine

## 2018-12-10 ENCOUNTER — Inpatient Hospital Stay: Payer: Medicare Other | Attending: Internal Medicine

## 2018-12-10 VITALS — HR 84

## 2018-12-10 VITALS — BP 127/73 | HR 101 | Temp 97.8°F | Resp 18 | Ht 64.0 in | Wt 186.1 lb

## 2018-12-10 DIAGNOSIS — F419 Anxiety disorder, unspecified: Secondary | ICD-10-CM | POA: Insufficient documentation

## 2018-12-10 DIAGNOSIS — C7931 Secondary malignant neoplasm of brain: Secondary | ICD-10-CM

## 2018-12-10 DIAGNOSIS — C3411 Malignant neoplasm of upper lobe, right bronchus or lung: Secondary | ICD-10-CM | POA: Insufficient documentation

## 2018-12-10 DIAGNOSIS — Z79899 Other long term (current) drug therapy: Secondary | ICD-10-CM | POA: Diagnosis not present

## 2018-12-10 DIAGNOSIS — Z923 Personal history of irradiation: Secondary | ICD-10-CM | POA: Insufficient documentation

## 2018-12-10 DIAGNOSIS — Z5111 Encounter for antineoplastic chemotherapy: Secondary | ICD-10-CM

## 2018-12-10 DIAGNOSIS — Z9221 Personal history of antineoplastic chemotherapy: Secondary | ICD-10-CM | POA: Insufficient documentation

## 2018-12-10 DIAGNOSIS — Z5112 Encounter for antineoplastic immunotherapy: Secondary | ICD-10-CM | POA: Diagnosis not present

## 2018-12-10 DIAGNOSIS — C7949 Secondary malignant neoplasm of other parts of nervous system: Secondary | ICD-10-CM

## 2018-12-10 DIAGNOSIS — C3490 Malignant neoplasm of unspecified part of unspecified bronchus or lung: Secondary | ICD-10-CM

## 2018-12-10 LAB — CMP (CANCER CENTER ONLY)
ALT: 12 U/L (ref 0–44)
AST: 12 U/L — ABNORMAL LOW (ref 15–41)
Albumin: 3.4 g/dL — ABNORMAL LOW (ref 3.5–5.0)
Alkaline Phosphatase: 90 U/L (ref 38–126)
Anion gap: 13 (ref 5–15)
BUN: 11 mg/dL (ref 8–23)
CO2: 21 mmol/L — ABNORMAL LOW (ref 22–32)
Calcium: 9.1 mg/dL (ref 8.9–10.3)
Chloride: 108 mmol/L (ref 98–111)
Creatinine: 0.75 mg/dL (ref 0.44–1.00)
GFR, Est AFR Am: >60 mL/min (ref >60)
GFR, Estimated: >60 mL/min (ref >60)
Glucose, Bld: 143 mg/dL — ABNORMAL HIGH (ref 70–99)
Potassium: 3.9 mmol/L (ref 3.5–5.1)
Sodium: 142 mmol/L (ref 135–145)
Total Bilirubin: 0.4 mg/dL (ref 0.3–1.2)
Total Protein: 6.8 g/dL (ref 6.5–8.1)

## 2018-12-10 LAB — CBC WITH DIFFERENTIAL/PLATELET
Abs Immature Granulocytes: 0.06 10*3/uL (ref 0.00–0.07)
Basophils Absolute: 0 10*3/uL (ref 0.0–0.1)
Basophils Relative: 0 %
Eosinophils Absolute: 0 10*3/uL (ref 0.0–0.5)
Eosinophils Relative: 0 %
HCT: 38.5 % (ref 36.0–46.0)
Hemoglobin: 11.8 g/dL — ABNORMAL LOW (ref 12.0–15.0)
Immature Granulocytes: 1 %
Lymphocytes Relative: 4 %
Lymphs Abs: 0.3 10*3/uL — ABNORMAL LOW (ref 0.7–4.0)
MCH: 31.5 pg (ref 26.0–34.0)
MCHC: 30.6 g/dL (ref 30.0–36.0)
MCV: 102.7 fL — ABNORMAL HIGH (ref 80.0–100.0)
Monocytes Absolute: 0.7 10*3/uL (ref 0.1–1.0)
Monocytes Relative: 9 %
Neutro Abs: 7.2 10*3/uL (ref 1.7–7.7)
Neutrophils Relative %: 86 %
Platelets: 276 10*3/uL (ref 150–400)
RBC: 3.75 MIL/uL — ABNORMAL LOW (ref 3.87–5.11)
RDW: 15.8 % — ABNORMAL HIGH (ref 11.5–15.5)
WBC: 8.4 10*3/uL (ref 4.0–10.5)
nRBC: 0 % (ref 0.0–0.2)

## 2018-12-10 MED ORDER — ONDANSETRON HCL 8 MG PO TABS
ORAL_TABLET | ORAL | Status: AC
  Filled 2018-12-10: qty 1

## 2018-12-10 MED ORDER — ALPRAZOLAM 0.25 MG PO TABS: 0.2500 mg | ORAL_TABLET | Freq: Three times a day (TID) | ORAL | 0 refills | Status: DC | PRN

## 2018-12-10 MED ORDER — DEXAMETHASONE SODIUM PHOSPHATE 10 MG/ML IJ SOLN
10.0000 mg | Freq: Once | INTRAMUSCULAR | Status: AC
  Administered 2018-12-10: 10 mg via INTRAVENOUS

## 2018-12-10 MED ORDER — SODIUM CHLORIDE 0.9 % IV SOLN
500.0000 mg/m2 | Freq: Once | INTRAVENOUS | Status: AC
  Administered 2018-12-10: 900 mg via INTRAVENOUS
  Filled 2018-12-10: qty 20

## 2018-12-10 MED ORDER — ONDANSETRON HCL 8 MG PO TABS: 8.0000 mg | ORAL_TABLET | Freq: Once | ORAL | Status: DC

## 2018-12-10 MED ORDER — SODIUM CHLORIDE 0.9 % IV SOLN
Freq: Once | INTRAVENOUS | Status: AC
  Administered 2018-12-10: 10:00:00 via INTRAVENOUS
  Filled 2018-12-10: qty 250

## 2018-12-10 MED ORDER — DEXAMETHASONE SODIUM PHOSPHATE 10 MG/ML IJ SOLN
INTRAMUSCULAR | Status: AC
  Filled 2018-12-10: qty 1

## 2018-12-10 NOTE — Patient Instructions (Addendum)
Coronavirus (COVID-19) Are you at risk?  Are you at risk for the Coronavirus (COVID-19)?  To be considered HIGH RISK for Coronavirus (COVID-19), you have to meet the following criteria:  . Traveled to [place], [place], [place], [place] or [place]; or in the [place] to [place], [place], [place], or [place]; and have fever, cough, and shortness of breath within the last 2 weeks of travel OR . Been in close contact with a person diagnosed with COVID-19 within the last 2 weeks and have fever, cough, and shortness of breath . IF YOU DO NOT MEET THESE CRITERIA, YOU ARE CONSIDERED LOW RISK FOR COVID-19.  What to do if you are HIGH RISK for COVID-19?  . If you are having a medical emergency, call 911. . Seek medical care right away. Before you go to a doctor's office, urgent care or emergency department, call ahead and tell them about your recent travel, contact with someone diagnosed with COVID-19, and your symptoms. You should receive instructions from your physician's office regarding next steps of care.  . When you arrive at healthcare provider, tell the healthcare staff immediately you have returned from visiting [place], [place], [place], [place] or [place]; or traveled in the [place] to [place], [place], [place], or [place]; in the last two weeks or you have been in close contact with a person diagnosed with COVID-19 in the last 2 weeks.   . Tell the health care staff about your symptoms: fever, cough and shortness of breath. . After you have been seen by a medical provider, you will be either: o Tested for (COVID-19) and discharged home on quarantine except to seek medical care if symptoms worsen, and asked to  - Stay home and avoid contact with others until you get your results (4-5 days)  - Avoid travel on public transportation if possible (such as bus, train, or airplane) or o Sent to the Emergency Department by EMS for evaluation, COVID-19 testing, and possible  admission depending on your condition and test results.  What to do if you are LOW RISK for COVID-19?  Reduce your risk of any infection by using the same precautions used for avoiding the common cold or flu:  . Wash your hands often with soap and warm water for at least 20 seconds.  If soap and water are not readily available, use an alcohol-based hand sanitizer with at least 60% alcohol.  . If coughing or sneezing, cover your mouth and nose by coughing or sneezing into the elbow areas of your shirt or coat, into a tissue or into your sleeve (not your hands). . Avoid shaking hands with others and consider head nods or verbal greetings only. . Avoid touching your eyes, nose, or mouth with unwashed hands.  . Avoid close contact with people who are sick. . Avoid places or events with large numbers of people in one location, like concerts or sporting events. . Carefully consider travel plans you have or are making. . If you are planning any travel outside or inside the [place], visit the CDC's Travelers' Health webpage for the latest health notices. . If you have some symptoms but not all symptoms, continue to monitor at home and seek medical attention if your symptoms worsen. . If you are having a medical emergency, call 911.   ADDITIONAL HEALTHCARE OPTIONS FOR PATIENTS  [facility] Telehealth / e-Visit: [email or web address]         [facility]: [phone]  [facility]   Urgent Care: [facility]: [facility] Discharge Instructions for Patients Receiving Chemotherapy  Today you received the following chemotherapy agents :  Alimta.  To help prevent nausea and vomiting after your treatment, we encourage you to take your nausea medication as prescribed.   If you develop nausea and vomiting that is not controlled by your nausea medication, call the clinic.   BELOW  ARE SYMPTOMS THAT SHOULD BE REPORTED IMMEDIATELY:  *FEVER GREATER THAN 100.5 F  *CHILLS WITH OR WITHOUT FEVER  NAUSEA AND VOMITING THAT IS NOT CONTROLLED WITH YOUR NAUSEA MEDICATION  *UNUSUAL SHORTNESS OF BREATH  *UNUSUAL BRUISING OR BLEEDING  TENDERNESS IN MOUTH AND THROAT WITH OR WITHOUT PRESENCE OF ULCERS  *URINARY PROBLEMS  *BOWEL PROBLEMS  UNUSUAL RASH Items with * indicate a potential emergency and should be followed up as soon as possible.  Feel free to call the clinic should you have any questions or concerns. The clinic phone number is [phone].  Please show the [facility] at check-in to the Emergency Department and triage nurse.

## 2018-12-10 NOTE — Progress Notes (Signed)
[place] [phone]   [phone]  OFFICE PROGRESS NOTE  [name], MD [address] [place] [address]  DIAGNOSIS: Stage IV (T2a, N0, M1b) non-small cell lung cancer, adenocarcinoma with negative EGFR and ALK mutations diagnosed in January 2015 and presented with right upper lobe lung mass in addition to a solitary brain metastasis.  PRIOR THERAPY: 1) Status post stereotactic radiotherapy to a solitary right parietal brain lesion under the care of Dr. [name] on 10/16/2013. 2) Status post palliative radiotherapy to the right lung tumor under the care of Dr. [name] completed on 12/05/2013. 3) Systemic chemotherapy with carboplatin for AUC of 5 and Alimta 500 mg/M2 every 3 weeks. First dose Jan 06 2014. Status post 6 cycles.  CURRENT THERAPY: Systemic chemotherapy with maintenance Alimta 500 MG/M2 every 3 weeks, status post 78 cycles.  INTERVAL HISTORY: Alexis [name] 68 y.o. female presents to the clinic today for follow-up visit.  The patient is feeling fine today with no concerning complaints except for mild fatigue.  She denied having any chest pain, shortness of breath, cough or hemoptysis.  She denied having any fever or chills.  She has no nausea, vomiting, diarrhea or constipation.  She denied having any headache or visual changes.  She continues to tolerate her treatment with maintenance Alimta fairly well.  She is here for evaluation before starting cycle #79.  MEDICAL HISTORY: Past Medical History:  Diagnosis Date  . Anxiety   . Anxiety 06/20/2016  . Cervical cancer ([place])   . Encounter for antineoplastic chemotherapy 07/20/2015  . 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  MEDICATIONS:  Current Outpatient Medications  Medication Sig Dispense Refill  . acetaminophen (TYLENOL) 500 MG tablet Take 500 mg by mouth every 6  (six) hours as needed for mild pain or headache. Reported on 11/02/2015    . ALPRAZolam (XANAX) 0.25 MG tablet Take 1 tablet (0.25 mg total) by mouth 3 (three) times daily as needed. 30 tablet 0  . Ascorbic Acid (VITAMIN C GUMMIE PO) Take 1 each by mouth every morning.    Marland Kitchen dexamethasone (DECADRON) 4 MG tablet TAKE 1 TABLET BY MOUTH TWICE A DAY THE DAY BEFORE, DAY OF AND DAY AFTER THE CHEMOTHERAPY EVERY 3 WKS 40 tablet 1  . folic acid (FOLVITE) 1 MG tablet TAKE 1 TABLET BY MOUTH EVERY DAY 90 tablet 0  . Multiple Vitamin (MULTIVITAMIN WITH MINERALS) TABS tablet Take 1 tablet by mouth every morning.    Marland Kitchen omeprazole (PRILOSEC) 20 MG capsule Take 1 capsule (20 mg total) by mouth daily. As needed for reflux or indigestion. 42 capsule PRN  . ondansetron (ZOFRAN) 8 MG tablet Take one tab po before chemo. 30 tablet 0  . OVER THE COUNTER MEDICATION Take 1 tablet by mouth every morning. (Vitamin A)    . phenazopyridine (PYRIDIUM) 200 MG tablet Take 1 tablet (200 mg total) by mouth 3 (three) times daily as needed for pain. 15 tablet 0  . prochlorperazine (COMPAZINE) 10 MG tablet Take 1 tablet (10 mg total) by mouth every 6 (six) hours as needed for nausea or vomiting. 60 tablet 0  . senna-docusate (SENOKOT-S) 8.6-50 MG tablet Take 1 tablet by mouth daily. 30 tablet prn  . sulfamethoxazole-trimethoprim (BACTRIM DS,SEPTRA DS) 800-160 MG tablet Take 1 tablet by mouth 2 (two) times  daily. 14 tablet 0   No current facility-administered medications for this visit.     SURGICAL HISTORY:  Past Surgical History:  Procedure Laterality Date  . ABDOMINAL HYSTERECTOMY    . COLOSTOMY TAKEDOWN N/A 07/10/2014   Procedure: LAPAROSCOPIC LYSIS OF ADHESIONS (90 MIN) LAPAROSCOPIC ASSISTED COLOSTOMY CLOSURE, RIGID PROCTOSIGMOIDOSCOPY;  Surgeon: Jackolyn Confer, MD;  Location: [facility];  Service: General;  Laterality: N/A;  . LAPAROTOMY N/A 11/03/2013   Procedure: EXPLORATORY LAPAROTOMY, DRAINAGE OF INTRA  ABDOMINAL ABSCESSES,  MOBILIZATION OF SPLENIC FLEXURE, SIGMOID COLECTOMY WITH COLOSTOMY;  Surgeon: Odis Hollingshead, MD;  Location: [facility];  Service: General;  Laterality: N/A;  . VIDEO BRONCHOSCOPY Bilateral 08/30/2013   Procedure: VIDEO BRONCHOSCOPY WITH FLUORO;  Surgeon: Tanda Rockers, MD;  Location: [facility];  Service: Cardiopulmonary;  Laterality: Bilateral;    REVIEW OF SYSTEMS:  A comprehensive review of systems was negative.   PHYSICAL EXAMINATION: General appearance: alert, cooperative and no distress Head: Normocephalic, without obvious abnormality, atraumatic Neck: no adenopathy, no JVD, supple, symmetrical, trachea midline and thyroid not enlarged, symmetric, no tenderness/mass/nodules Lymph nodes: Cervical, supraclavicular, and axillary nodes normal. Resp: clear to auscultation bilaterally Back: symmetric, no curvature. ROM normal. No CVA tenderness. Cardio: regular rate and rhythm, S1, S2 normal, no murmur, click, rub or gallop GI: soft, non-tender; bowel sounds normal; no masses,  no organomegaly Extremities: extremities normal, atraumatic, no cyanosis or edema  ECOG PERFORMANCE STATUS: 0 - Asymptomatic  Blood pressure 127/73, pulse (!) 101, temperature 97.8 F (36.6 C), temperature source Oral, resp. rate 18, height _0  (1.626 m), weight 186 lb 1.6 oz (84.4 kg), SpO2 100 %.  LABORATORY DATA: Lab Results  Component Value Date   WBC 8.4 12/10/2018   HGB 11.8 (L) 12/10/2018   HCT 38.5 12/10/2018   MCV 102.7 (H) 12/10/2018   PLT 276 12/10/2018      Chemistry      Component Value Date/Time   NA 142 12/10/2018 0858   NA 139 08/14/2017 0837   K 3.9 12/10/2018 0858   K 4.4 08/14/2017 0837   CL 108 12/10/2018 0858   CO2 21 (L) 12/10/2018 0858   CO2 24 08/14/2017 0837   BUN 11 12/10/2018 0858   BUN 13.3 08/14/2017 0837   CREATININE 0.75 12/10/2018 0858   CREATININE 0.8 08/14/2017 0837      Component Value Date/Time   CALCIUM 9.1 12/10/2018 0858   CALCIUM 9.3 08/14/2017 0837    ALKPHOS 90 12/10/2018 0858   ALKPHOS 107 08/14/2017 0837   AST 12 (L) 12/10/2018 0858   AST 12 08/14/2017 0837   ALT 12 12/10/2018 0858   ALT 12 08/14/2017 0837   BILITOT 0.4 12/10/2018 0858   BILITOT 0.37 08/14/2017 0837       RADIOGRAPHIC STUDIES: No results found.  ASSESSMENT AND PLAN:  This is a very pleasant 68 years old white female with metastatic non-small cell lung cancer, adenocarcinoma status post induction systemic chemotherapy with carboplatin and Alimta with partial response. The patient is currently on maintenance treatment with single agent Alimta status post 78 cycles. She continues to tolerate her treatment well with no concerning adverse effects. I recommended for her to proceed with cycle #79 today as scheduled. I will see her back for follow-up visit in 3 weeks for evaluation after repeating CT scan of the chest, abdomen and pelvis for restaging of her disease. For the anxiety, I will give her a refill of Xanax. She was advised to call immediately if she has  any concerning symptoms in the interval. The patient voices understanding of current disease status and treatment options and is in agreement with the current care plan. All questions were answered. The patient knows to call the clinic with any problems, questions or concerns. We can certainly see the patient much sooner if necessary.  Disclaimer: This note was dictated with voice recognition software. Similar sounding words can inadvertently be transcribed and may not be corrected upon review.

## 2018-12-28 ENCOUNTER — Ambulatory Visit
Admission: RE | Admit: 2018-12-28 | Discharge: 2018-12-28 | Disposition: A | Discharge status: Home / Self Care | Payer: Medicare Other | Source: Ambulatory Visit | Attending: Internal Medicine | Admitting: Internal Medicine

## 2018-12-28 ENCOUNTER — Other Ambulatory Visit: Payer: Self-pay

## 2018-12-28 DIAGNOSIS — C3490 Malignant neoplasm of unspecified part of unspecified bronchus or lung: Secondary | ICD-10-CM

## 2018-12-28 MED ORDER — IOPAMIDOL (ISOVUE-300) INJECTION 61%
100.0000 mL | Freq: Once | INTRAVENOUS | Status: AC | PRN
  Administered 2018-12-28: 100 mL via INTRAVENOUS

## 2018-12-31 ENCOUNTER — Encounter: Payer: Self-pay | Admitting: Internal Medicine

## 2018-12-31 ENCOUNTER — Other Ambulatory Visit: Payer: Self-pay

## 2018-12-31 ENCOUNTER — Telehealth: Payer: Self-pay | Admitting: Internal Medicine

## 2018-12-31 ENCOUNTER — Inpatient Hospital Stay: Payer: Medicare Other

## 2018-12-31 ENCOUNTER — Inpatient Hospital Stay (HOSPITAL_BASED_OUTPATIENT_CLINIC_OR_DEPARTMENT_OTHER): Payer: Medicare Other | Admitting: Internal Medicine

## 2018-12-31 ENCOUNTER — Inpatient Hospital Stay: Payer: Medicare Other | Attending: Internal Medicine

## 2018-12-31 VITALS — BP 135/63 | HR 105 | Temp 97.5°F | Resp 18 | Ht 64.0 in | Wt 185.4 lb

## 2018-12-31 DIAGNOSIS — Z79899 Other long term (current) drug therapy: Secondary | ICD-10-CM

## 2018-12-31 DIAGNOSIS — T451X5A Adverse effect of antineoplastic and immunosuppressive drugs, initial encounter: Secondary | ICD-10-CM

## 2018-12-31 DIAGNOSIS — Z5112 Encounter for antineoplastic immunotherapy: Secondary | ICD-10-CM | POA: Diagnosis not present

## 2018-12-31 DIAGNOSIS — F419 Anxiety disorder, unspecified: Secondary | ICD-10-CM | POA: Diagnosis not present

## 2018-12-31 DIAGNOSIS — Z923 Personal history of irradiation: Secondary | ICD-10-CM | POA: Insufficient documentation

## 2018-12-31 DIAGNOSIS — C3411 Malignant neoplasm of upper lobe, right bronchus or lung: Secondary | ICD-10-CM | POA: Diagnosis not present

## 2018-12-31 DIAGNOSIS — C7931 Secondary malignant neoplasm of brain: Secondary | ICD-10-CM | POA: Diagnosis not present

## 2018-12-31 DIAGNOSIS — Z9221 Personal history of antineoplastic chemotherapy: Secondary | ICD-10-CM | POA: Insufficient documentation

## 2018-12-31 DIAGNOSIS — C7949 Secondary malignant neoplasm of other parts of nervous system: Secondary | ICD-10-CM

## 2018-12-31 DIAGNOSIS — D6481 Anemia due to antineoplastic chemotherapy: Secondary | ICD-10-CM

## 2018-12-31 DIAGNOSIS — Z5111 Encounter for antineoplastic chemotherapy: Secondary | ICD-10-CM

## 2018-12-31 LAB — COMPREHENSIVE METABOLIC PANEL
ALT: 11 U/L (ref 0–44)
AST: 10 U/L — ABNORMAL LOW (ref 15–41)
Albumin: 3.5 g/dL (ref 3.5–5.0)
Alkaline Phosphatase: 93 U/L (ref 38–126)
Anion gap: 14 (ref 5–15)
BUN: 17 mg/dL (ref 8–23)
CO2: 18 mmol/L — ABNORMAL LOW (ref 22–32)
Calcium: 9.2 mg/dL (ref 8.9–10.3)
Chloride: 109 mmol/L (ref 98–111)
Creatinine, Ser: 0.82 mg/dL (ref 0.44–1.00)
GFR calc Af Amer: >60 mL/min (ref >60)
GFR calc non Af Amer: >60 mL/min (ref >60)
Glucose, Bld: 153 mg/dL — ABNORMAL HIGH (ref 70–99)
Potassium: 4 mmol/L (ref 3.5–5.1)
Sodium: 141 mmol/L (ref 135–145)
Total Bilirubin: 0.4 mg/dL (ref 0.3–1.2)
Total Protein: 6.8 g/dL (ref 6.5–8.1)

## 2018-12-31 LAB — CBC WITH DIFFERENTIAL/PLATELET
Abs Immature Granulocytes: 0.08 10*3/uL — ABNORMAL HIGH (ref 0.00–0.07)
Basophils Absolute: 0 10*3/uL (ref 0.0–0.1)
Basophils Relative: 0 %
Eosinophils Absolute: 0 10*3/uL (ref 0.0–0.5)
Eosinophils Relative: 0 %
HCT: 37.1 % (ref 36.0–46.0)
Hemoglobin: 11.5 g/dL — ABNORMAL LOW (ref 12.0–15.0)
Immature Granulocytes: 1 %
Lymphocytes Relative: 5 %
Lymphs Abs: 0.4 10*3/uL — ABNORMAL LOW (ref 0.7–4.0)
MCH: 32.4 pg (ref 26.0–34.0)
MCHC: 31 g/dL (ref 30.0–36.0)
MCV: 104.5 fL — ABNORMAL HIGH (ref 80.0–100.0)
Monocytes Absolute: 0.4 10*3/uL (ref 0.1–1.0)
Monocytes Relative: 5 %
Neutro Abs: 7.9 10*3/uL — ABNORMAL HIGH (ref 1.7–7.7)
Neutrophils Relative %: 89 %
Platelets: 325 10*3/uL (ref 150–400)
RBC: 3.55 MIL/uL — ABNORMAL LOW (ref 3.87–5.11)
RDW: 15.7 % — ABNORMAL HIGH (ref 11.5–15.5)
WBC: 8.8 10*3/uL (ref 4.0–10.5)
nRBC: 0 % (ref 0.0–0.2)

## 2018-12-31 MED ORDER — SODIUM CHLORIDE 0.9 % IV SOLN
Freq: Once | INTRAVENOUS | Status: AC
  Administered 2018-12-31: 10:00:00 via INTRAVENOUS
  Filled 2018-12-31: qty 250

## 2018-12-31 MED ORDER — CYANOCOBALAMIN 1000 MCG/ML IJ SOLN
INTRAMUSCULAR | Status: AC
  Filled 2018-12-31: qty 1

## 2018-12-31 MED ORDER — CYANOCOBALAMIN 1000 MCG/ML IJ SOLN
1000.0000 ug | Freq: Once | INTRAMUSCULAR | Status: AC
  Administered 2018-12-31: 1000 ug via INTRAMUSCULAR

## 2018-12-31 MED ORDER — ONDANSETRON HCL 8 MG PO TABS: 8.0000 mg | ORAL_TABLET | Freq: Once | ORAL | Status: DC

## 2018-12-31 MED ORDER — DEXAMETHASONE SODIUM PHOSPHATE 10 MG/ML IJ SOLN
INTRAMUSCULAR | Status: AC
  Filled 2018-12-31: qty 1

## 2018-12-31 MED ORDER — SODIUM CHLORIDE 0.9 % IV SOLN
500.0000 mg/m2 | Freq: Once | INTRAVENOUS | Status: AC
  Administered 2018-12-31: 900 mg via INTRAVENOUS
  Filled 2018-12-31: qty 16

## 2018-12-31 MED ORDER — DEXAMETHASONE SODIUM PHOSPHATE 10 MG/ML IJ SOLN
10.0000 mg | Freq: Once | INTRAMUSCULAR | Status: AC
  Administered 2018-12-31: 10 mg via INTRAVENOUS

## 2018-12-31 NOTE — Progress Notes (Signed)
[place] [phone]   Fax:(336) [phone]  OFFICE PROGRESS NOTE  Alexis Roers, MD [address] [place] [address]  DIAGNOSIS: Stage IV (T2a, N0, M1b) non-small cell lung cancer, adenocarcinoma with negative EGFR and ALK mutations diagnosed in January 2015 and presented with right upper lobe lung mass in addition to a solitary brain metastasis.  PRIOR THERAPY: 1) Status post stereotactic radiotherapy to a solitary right parietal brain lesion under the care of Dr. [name] on 10/16/2013. 2) Status post palliative radiotherapy to the right lung tumor under the care of Dr. [name] completed on 12/05/2013. 3) Systemic chemotherapy with carboplatin for AUC of 5 and Alimta 500 mg/M2 every 3 weeks. First dose Jan 06 2014. Status post 6 cycles.  CURRENT THERAPY: Systemic chemotherapy with maintenance Alimta 500 MG/M2 every 3 weeks, status post 79 cycles.  INTERVAL HISTORY: Alexis [name] 68 y.o. female returns to the clinic today for follow-up visit.  The patient is feeling fine today with no concerning complaints.  She had one episode of chest pain few weeks ago and she took Xanax and slept with resolution of her pain.  She denied having any shortness of breath, cough or hemoptysis.  She denied having any fever or chills.  She has no nausea, vomiting, diarrhea or constipation.  She denied having any headache or visual changes.  The patient continues to tolerate her treatment with Alimta fairly well.  She had repeat CT scan of the chest, abdomen and pelvis performed recently and she is here for evaluation and discussion of her scan results.   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  MEDICATIONS:  Current Outpatient Medications  Medication Sig Dispense Refill   acetaminophen (TYLENOL) 500 MG tablet Take 500 mg by mouth every 6 (six) hours as needed for mild pain or headache. Reported on 11/02/2015     ALPRAZolam (XANAX) 0.25 MG tablet Take 1 tablet (0.25 mg total) by mouth 3 (three) times daily as needed. 30 tablet 0   Ascorbic Acid (VITAMIN C GUMMIE PO) Take 1 each by mouth every morning.     dexamethasone (DECADRON) 4 MG tablet TAKE 1 TABLET BY MOUTH TWICE A DAY THE DAY BEFORE, DAY OF AND DAY AFTER THE CHEMOTHERAPY EVERY 3 WKS 40 tablet 1   folic acid (FOLVITE) 1 MG tablet TAKE 1 TABLET BY MOUTH EVERY DAY 90 tablet 0   Multiple Vitamin (MULTIVITAMIN WITH MINERALS) TABS tablet Take 1 tablet by mouth every morning.     omeprazole (PRILOSEC) 20 MG capsule Take 1 capsule (20 mg total) by mouth daily. As needed for reflux or indigestion. 42 capsule PRN   ondansetron (ZOFRAN) 8 MG tablet Take one tab po before chemo. 30 tablet 0   OVER THE COUNTER MEDICATION Take 1 tablet by mouth every morning. (Vitamin A)     phenazopyridine (PYRIDIUM) 200 MG tablet Take 1 tablet (200 mg total) by mouth 3 (three) times daily as needed for pain. 15 tablet 0   prochlorperazine (COMPAZINE) 10 MG tablet Take 1 tablet (10 mg total) by mouth every 6 (six) hours as needed for nausea or vomiting. 60 tablet 0  senna-docusate (SENOKOT-S) 8.6-50 MG tablet Take 1 tablet by mouth daily. 30 tablet prn   sulfamethoxazole-trimethoprim (BACTRIM DS,SEPTRA DS) 800-160 MG tablet Take 1 tablet by mouth 2 (two) times daily. 14 tablet 0   No current facility-administered medications for this visit.     SURGICAL HISTORY:  Past Surgical History:  Procedure Laterality Date   ABDOMINAL HYSTERECTOMY     COLOSTOMY TAKEDOWN N/A 07/10/2014   Procedure: LAPAROSCOPIC LYSIS OF ADHESIONS (90 MIN) LAPAROSCOPIC ASSISTED COLOSTOMY CLOSURE, RIGID PROCTOSIGMOIDOSCOPY;  Surgeon: Jackolyn Confer, MD;   Location: [facility];  Service: General;  Laterality: N/A;   LAPAROTOMY N/A 11/03/2013   Procedure: EXPLORATORY LAPAROTOMY, DRAINAGE OF INTRA  ABDOMINAL ABSCESSES, MOBILIZATION OF SPLENIC FLEXURE, SIGMOID COLECTOMY WITH COLOSTOMY;  Surgeon: Odis Hollingshead, MD;  Location: [facility];  Service: General;  Laterality: N/A;   VIDEO BRONCHOSCOPY Bilateral 08/30/2013   Procedure: VIDEO BRONCHOSCOPY WITH FLUORO;  Surgeon: Tanda Rockers, MD;  Location: [facility];  Service: Cardiopulmonary;  Laterality: Bilateral;    REVIEW OF SYSTEMS:  Constitutional: negative Eyes: negative Ears, nose, mouth, throat, and face: negative Respiratory: negative Cardiovascular: negative Gastrointestinal: negative Genitourinary:negative Integument/breast: negative Hematologic/lymphatic: negative Musculoskeletal:negative Neurological: negative Behavioral/Psych: negative Endocrine: negative Allergic/Immunologic: negative   PHYSICAL EXAMINATION: General appearance: alert, cooperative and no distress Head: Normocephalic, without obvious abnormality, atraumatic Neck: no adenopathy, no JVD, supple, symmetrical, trachea midline and thyroid not enlarged, symmetric, no tenderness/mass/nodules Lymph nodes: Cervical, supraclavicular, and axillary nodes normal. Resp: clear to auscultation bilaterally Back: symmetric, no curvature. ROM normal. No CVA tenderness. Cardio: regular rate and rhythm, S1, S2 normal, no murmur, click, rub or gallop GI: soft, non-tender; bowel sounds normal; no masses,  no organomegaly Extremities: extremities normal, atraumatic, no cyanosis or edema Neurologic: Alert and oriented X 3, normal strength and tone. Normal symmetric reflexes. Normal coordination and gait  ECOG PERFORMANCE STATUS: 0 - Asymptomatic  Blood pressure 135/63, pulse (!) 105, temperature (!) 97.5 F (36.4 C), temperature source Oral, resp. rate 18, height 5' 4"  (1.626 m), weight 185 lb 6.4 oz (84.1 kg), SpO2 99 %.  LABORATORY  DATA: Lab Results  Component Value Date   WBC 8.4 12/10/2018   HGB 11.8 (L) 12/10/2018   HCT 38.5 12/10/2018   MCV 102.7 (H) 12/10/2018   PLT 276 12/10/2018      Chemistry      Component Value Date/Time   NA 142 12/10/2018 0858   NA 139 08/14/2017 0837   K 3.9 12/10/2018 0858   K 4.4 08/14/2017 0837   CL 108 12/10/2018 0858   CO2 21 (L) 12/10/2018 0858   CO2 24 08/14/2017 0837   BUN 11 12/10/2018 0858   BUN 13.3 08/14/2017 0837   CREATININE 0.75 12/10/2018 0858   CREATININE 0.8 08/14/2017 0837      Component Value Date/Time   CALCIUM 9.1 12/10/2018 0858   CALCIUM 9.3 08/14/2017 0837   ALKPHOS 90 12/10/2018 0858   ALKPHOS 107 08/14/2017 0837   AST 12 (L) 12/10/2018 0858   AST 12 08/14/2017 0837   ALT 12 12/10/2018 0858   ALT 12 08/14/2017 0837   BILITOT 0.4 12/10/2018 0858   BILITOT 0.37 08/14/2017 0837       RADIOGRAPHIC STUDIES: Ct Chest W Contrast  Result Date: 12/28/2018 CLINICAL DATA:  Lung cancer, metastatic, cervical cancer, colon resection EXAM: CT CHEST, ABDOMEN, AND PELVIS WITH CONTRAST TECHNIQUE: Multidetector CT imaging of the chest, abdomen and pelvis was performed following the standard protocol during bolus administration of intravenous contrast. CONTRAST:  129m  ISOVUE-300 IOPAMIDOL (ISOVUE-300) INJECTION 61%, additional oral enteric contrast COMPARISON:  10/05/2018, 06/21/2018 FINDINGS: CT CHEST FINDINGS Cardiovascular: No significant vascular findings. Normal heart size. No pericardial effusion. Mediastinum/Nodes: No change in right hilar soft tissue. No mediastinal lymphadenopathy. Thyroid gland, trachea, and esophagus demonstrate no significant findings. Lungs/Pleura: Unchanged post treatment appearance of the para median right upper lobe. Mild underlying emphysema. No pleural effusion or pneumothorax. Musculoskeletal: No chest wall mass or suspicious bone lesions identified. CT ABDOMEN PELVIS FINDINGS Hepatobiliary: No focal liver abnormality is seen. No  gallstones, gallbladder wall thickening, or biliary dilatation. Pancreas: Unremarkable. No pancreatic ductal dilatation or surrounding inflammatory changes. Spleen: Normal in size without focal abnormality. Adrenals/Urinary Tract: Adrenal glands are unremarkable. Kidneys are normal, without renal calculi, focal lesion, or hydronephrosis. Bladder is unremarkable. Stomach/Bowel: Stomach is within normal limits. Appendix appears normal. No evidence of bowel wall thickening, distention, or inflammatory changes. Status post sigmoid colon resection and anastomosis. Vascular/Lymphatic: Mixed calcific atherosclerosis. No enlarged abdominal or pelvic lymph nodes. Reproductive: Status post hysterectomy. Other: No abdominal wall hernia or abnormality. No abdominopelvic ascites. Musculoskeletal: No acute or significant osseous findings. IMPRESSION: 1. Stable post treatment appearance of the paramedian right upper lobe and soft tissue of the right hilum. 2. No evidence of metastatic disease in the chest, abdomen, or pelvis. 3. Previously described ground-glass opacities in the right lower lobe are resolved, consistent with resolved infection or inflammation. 4. Chronic, incidental, and postoperative findings as detailed above. Electronically Signed   By: Eddie Candle M.D.   On: 12/28/2018 11:41   Ct Abdomen Pelvis W Contrast  Result Date: 12/28/2018 CLINICAL DATA:  Lung cancer, metastatic, cervical cancer, colon resection EXAM: CT CHEST, ABDOMEN, AND PELVIS WITH CONTRAST TECHNIQUE: Multidetector CT imaging of the chest, abdomen and pelvis was performed following the standard protocol during bolus administration of intravenous contrast. CONTRAST:  183m ISOVUE-300 IOPAMIDOL (ISOVUE-300) INJECTION 61%, additional oral enteric contrast COMPARISON:  10/05/2018, 06/21/2018 FINDINGS: CT CHEST FINDINGS Cardiovascular: No significant vascular findings. Normal heart size. No pericardial effusion. Mediastinum/Nodes: No change in right  hilar soft tissue. No mediastinal lymphadenopathy. Thyroid gland, trachea, and esophagus demonstrate no significant findings. Lungs/Pleura: Unchanged post treatment appearance of the para median right upper lobe. Mild underlying emphysema. No pleural effusion or pneumothorax. Musculoskeletal: No chest wall mass or suspicious bone lesions identified. CT ABDOMEN PELVIS FINDINGS Hepatobiliary: No focal liver abnormality is seen. No gallstones, gallbladder wall thickening, or biliary dilatation. Pancreas: Unremarkable. No pancreatic ductal dilatation or surrounding inflammatory changes. Spleen: Normal in size without focal abnormality. Adrenals/Urinary Tract: Adrenal glands are unremarkable. Kidneys are normal, without renal calculi, focal lesion, or hydronephrosis. Bladder is unremarkable. Stomach/Bowel: Stomach is within normal limits. Appendix appears normal. No evidence of bowel wall thickening, distention, or inflammatory changes. Status post sigmoid colon resection and anastomosis. Vascular/Lymphatic: Mixed calcific atherosclerosis. No enlarged abdominal or pelvic lymph nodes. Reproductive: Status post hysterectomy. Other: No abdominal wall hernia or abnormality. No abdominopelvic ascites. Musculoskeletal: No acute or significant osseous findings. IMPRESSION: 1. Stable post treatment appearance of the paramedian right upper lobe and soft tissue of the right hilum. 2. No evidence of metastatic disease in the chest, abdomen, or pelvis. 3. Previously described ground-glass opacities in the right lower lobe are resolved, consistent with resolved infection or inflammation. 4. Chronic, incidental, and postoperative findings as detailed above. Electronically Signed   By: AEddie CandleM.D.   On: 12/28/2018 11:41    ASSESSMENT AND PLAN:  This is a very pleasant 68years old white female with  metastatic non-small cell lung cancer, adenocarcinoma status post induction systemic chemotherapy with carboplatin and Alimta  with partial response. The patient is currently on maintenance treatment with single agent Alimta status post 79 cycles. The patient continues to tolerate this treatment well with no concerning adverse effects. She had repeat CT scan of the chest, abdomen and pelvis performed recently.  I personally and independently reviewed the scans and discussed the results with the patient today. Her scan showed no concerning findings for disease recurrence or progression. I recommended for the patient to continue her current treatment with Alimta and she will proceed with cycle #80 today. For anxiety she will continue on Xanax. The patient will come back for follow-up visit in 3 weeks for evaluation before starting the next cycle of her treatment. She was advised to call immediately if she has any concerning symptoms in the interval. The patient voices understanding of current disease status and treatment options and is in agreement with the current care plan. All questions were answered. The patient knows to call the clinic with any problems, questions or concerns. We can certainly see the patient much sooner if necessary.  Disclaimer: This note was dictated with voice recognition software. Similar sounding words can inadvertently be transcribed and may not be corrected upon review.

## 2018-12-31 NOTE — Telephone Encounter (Signed)
Scheduled appt per 5/4 los - pt to get an updated schedule next visit.

## 2018-12-31 NOTE — Progress Notes (Signed)
Per Dr Julien Nordmann it is Okay to treat today with Alimta and heart rate of 105.

## 2019-01-07 ENCOUNTER — Other Ambulatory Visit: Payer: Self-pay | Admitting: Radiation Therapy

## 2019-01-10 ENCOUNTER — Other Ambulatory Visit: Payer: Self-pay

## 2019-01-10 ENCOUNTER — Ambulatory Visit
Admission: RE | Admit: 2019-01-10 | Discharge: 2019-01-10 | Disposition: A | Discharge status: Home / Self Care | Payer: Medicare Other | Source: Ambulatory Visit | Attending: Radiation Oncology | Admitting: Radiation Oncology

## 2019-01-10 DIAGNOSIS — C7949 Secondary malignant neoplasm of other parts of nervous system: Secondary | ICD-10-CM

## 2019-01-10 DIAGNOSIS — C7931 Secondary malignant neoplasm of brain: Secondary | ICD-10-CM

## 2019-01-10 MED ORDER — GADOBENATE DIMEGLUMINE 529 MG/ML IV SOLN
17.0000 mL | Freq: Once | INTRAVENOUS | Status: AC | PRN
  Administered 2019-01-10: 17 mL via INTRAVENOUS

## 2019-01-14 ENCOUNTER — Ambulatory Visit: Payer: Self-pay | Admitting: Radiation Oncology

## 2019-01-14 ENCOUNTER — Inpatient Hospital Stay: Payer: Medicare Other

## 2019-01-15 ENCOUNTER — Ambulatory Visit
Admission: RE | Admit: 2019-01-15 | Discharge: 2019-01-15 | Disposition: A | Discharge status: Home / Self Care | Payer: Medicare Other | Source: Ambulatory Visit | Attending: Radiation Oncology | Admitting: Radiation Oncology

## 2019-01-15 ENCOUNTER — Ambulatory Visit: Payer: Medicare Other | Admitting: Radiation Oncology

## 2019-01-15 DIAGNOSIS — C3411 Malignant neoplasm of upper lobe, right bronchus or lung: Secondary | ICD-10-CM

## 2019-01-15 DIAGNOSIS — C7931 Secondary malignant neoplasm of brain: Secondary | ICD-10-CM

## 2019-01-15 DIAGNOSIS — Z08 Encounter for follow-up examination after completed treatment for malignant neoplasm: Secondary | ICD-10-CM | POA: Diagnosis not present

## 2019-01-15 DIAGNOSIS — Z85118 Personal history of other malignant neoplasm of bronchus and lung: Secondary | ICD-10-CM | POA: Diagnosis not present

## 2019-01-15 NOTE — Progress Notes (Addendum)
Radiation Oncology         [phone]) [phone]  Outpatient Follow Up - Conducted via telephone due to current COVID-19 concerns for limiting patient exposure  I spoke with the patient to conduct this consult visit via telephone to spare the patient unnecessary potential exposure in the healthcare setting during the current COVID-19 pandemic. The patient was notified in advance and was offered a [facility] meeting to allow for face to face communication but unfortunately reported that they did not have the appropriate resources/technology to support such a visit and instead preferred to proceed with a telephone discussion.  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  Interval Since Last Radiation: 5 years, 2 months  10/28/2013 through 12/05/2013: The patient was treated to the right lung tumor to a dose of 50 gray in 25 fractions using a 3-D conformal technique. Daily image guidance was used for the patient's treatment.  10/16/2013 SRS Treatment: PTV1: Rt Parietal 59mm target was treated using 3 Arcs to a prescription dose of 20 Gy. ExacTrac Snap verification was performed for each couch angle.  Narrative:  The patient returns today for routine follow-up. In summary this is a 67 y.o. patient with a history of metastatic lung cancer to the brain who was treated in two sessions in 2015 for her brain disease, followed by local control to the right lung. Since her treatment, she has done well and continues to be NED in the brain, and continues to remain with stable disease on systemic alimta. She has received her 80th cycle of this and recent restaging CTs show stability without progression. 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 She is no longer taking these medications. Her most recent MRI of the brain was on 01/10/2019 and showed post treatment change in the right parietal region without new disease or concerns for any recurrence.   On review of systems, the patient reports that she is doing well overall. She reports she is not having any concerns with headaches, visual changes, dizziness, or with her breathing and  denies any chest pain, shortness of breath, cough, fevers, chills, night sweats, unintended weight changes. She feels tired the day or two after chemo but reports she feels great otherwise. She denies any bowel or bladder disturbances, and denies abdominal pain, nausea or vomiting.  She denies any new musculoskeletal or joint aches or pains, new skin lesions or concerns. A complete review of systems is obtained and is otherwise negative.    Past Medical History:  Past Medical History:  Diagnosis Date   Anxiety    Anxiety 06/20/2016   Cervical cancer ([place])    Encounter for antineoplastic chemotherapy 07/20/2015   Malignant neoplasm of right upper lobe of lung (HCC)     non small cell lung cancer adenocarcioma with brain meta    Past Surgical History: Past Surgical History:  Procedure Laterality Date   ABDOMINAL HYSTERECTOMY     COLOSTOMY TAKEDOWN N/A 07/10/2014   Procedure: LAPAROSCOPIC LYSIS OF ADHESIONS (90 MIN) LAPAROSCOPIC ASSISTED COLOSTOMY CLOSURE, RIGID PROCTOSIGMOIDOSCOPY;  Surgeon: Jackolyn Confer, MD;  Location: [facility];  Service: General;  Laterality: N/A;   LAPAROTOMY N/A 11/03/2013   Procedure: EXPLORATORY LAPAROTOMY, DRAINAGE OF INTRA  ABDOMINAL ABSCESSES, MOBILIZATION OF SPLENIC FLEXURE, SIGMOID COLECTOMY WITH COLOSTOMY;  Surgeon: Odis Hollingshead, MD;  Location: [facility];  Service: General;  Laterality: N/A;   VIDEO BRONCHOSCOPY Bilateral 08/30/2013   Procedure: VIDEO BRONCHOSCOPY WITH FLUORO;  Surgeon: Tanda Rockers, MD;   Location: [facility];  Service: Cardiopulmonary;  Laterality: Bilateral;    Social History:  Social History   Socioeconomic History   Marital status: Married    Spouse name: Not on file   Number of children: Not on file   Years of education: Not on file   Highest education level: Not on file  Occupational History   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 [place] resource strain: Not on file   Food insecurity:    Worry: Not on file    Inability: Not on file   Transportation needs:    Medical: Not on file    Non-medical: Not on file  Tobacco Use   Smoking status: Former Smoker    Packs/day: 1.00    Years: 40.00    Pack years: 40.00    Types: Cigarettes    Last attempt to quit: 09/27/2013    Years since quitting: 5.3   Smokeless tobacco: Never Used  Substance and Sexual Activity   Alcohol use: No   Drug use: No   Sexual activity: Yes  Lifestyle   Physical activity:    Days per week: Not on file    Minutes per session: Not on file   Stress: Not on file  Relationships   Social connections:    Talks on phone: Not on file    Gets together: Not on file    Attends religious service: Not on file    Active member of club or organization: Not on file    Attends meetings of clubs or organizations: Not on file    Relationship status: Not on file   Intimate partner violence:    Fear of current or ex partner: Not on file    Emotionally abused: Not on file    Physically abused: Not on file    Forced sexual activity: Not on file  Other Topics Concern   Not on file  Social History Narrative   Not on file  The patient is married. She lives in [place], [place].  Family History: Family History  Problem Relation Age of Onset   Emphysema Father        smoked   Lung cancer Father        smoked   Cancer Mother    Hypertension Mother    COPD Mother     ALLERGIES:  is allergic to codeine.  Meds: Current  Outpatient Medications  Medication Sig Dispense Refill   acetaminophen (TYLENOL) 500 MG tablet Take 500 mg by mouth every 6 (six) hours as needed for mild pain or headache. Reported on 11/02/2015     ALPRAZolam (XANAX) 0.25 MG tablet Take 1 tablet (0.25 mg total) by mouth 3 (three) times daily as needed. 30 tablet 0   Ascorbic Acid (VITAMIN C GUMMIE PO) Take 1 each by mouth every morning.     dexamethasone (DECADRON) 4 MG tablet TAKE 1 TABLET BY MOUTH TWICE A DAY THE DAY BEFORE, DAY OF AND DAY AFTER THE CHEMOTHERAPY EVERY 3 WKS 40 tablet 1   folic acid (FOLVITE) 1 MG tablet TAKE 1 TABLET BY MOUTH EVERY DAY 90 tablet 0   Multiple Vitamin (MULTIVITAMIN WITH MINERALS) TABS tablet Take 1 tablet  by mouth every morning.     omeprazole (PRILOSEC) 20 MG capsule Take 1 capsule (20 mg total) by mouth daily. As needed for reflux or indigestion. 42 capsule PRN   ondansetron (ZOFRAN) 8 MG tablet Take one tab po before chemo. 30 tablet 0   OVER THE COUNTER MEDICATION Take 1 tablet by mouth every morning. (Vitamin A)     phenazopyridine (PYRIDIUM) 200 MG tablet Take 1 tablet (200 mg total) by mouth 3 (three) times daily as needed for pain. 15 tablet 0   prochlorperazine (COMPAZINE) 10 MG tablet Take 1 tablet (10 mg total) by mouth every 6 (six) hours as needed for nausea or vomiting. 60 tablet 0   senna-docusate (SENOKOT-S) 8.6-50 MG tablet Take 1 tablet by mouth daily. 30 tablet prn   sulfamethoxazole-trimethoprim (BACTRIM DS,SEPTRA DS) 800-160 MG tablet Take 1 tablet by mouth 2 (two) times daily. 14 tablet 0   No current facility-administered medications for this encounter.     Physical Findings: Unable to assess due to type of encounter  Lab Findings: Lab Results  Component Value Date   WBC 8.8 12/31/2018   HGB 11.5 (L) 12/31/2018   HCT 37.1 12/31/2018   MCV 104.5 (H) 12/31/2018   PLT 325 12/31/2018     Radiographic Findings: Ct Chest W Contrast  Result Date: 12/28/2018 CLINICAL  DATA:  Lung cancer, metastatic, cervical cancer, colon resection EXAM: CT CHEST, ABDOMEN, AND PELVIS WITH CONTRAST TECHNIQUE: Multidetector CT imaging of the chest, abdomen and pelvis was performed following the standard protocol during bolus administration of intravenous contrast. CONTRAST:  112mL ISOVUE-300 IOPAMIDOL (ISOVUE-300) INJECTION 61%, additional oral enteric contrast COMPARISON:  10/05/2018, 06/21/2018 FINDINGS: CT CHEST FINDINGS Cardiovascular: No significant vascular findings. Normal heart size. No pericardial effusion. Mediastinum/Nodes: No change in right hilar soft tissue. No mediastinal lymphadenopathy. Thyroid gland, trachea, and esophagus demonstrate no significant findings. Lungs/Pleura: Unchanged post treatment appearance of the para median right upper lobe. Mild underlying emphysema. No pleural effusion or pneumothorax. Musculoskeletal: No chest wall mass or suspicious bone lesions identified. CT ABDOMEN PELVIS FINDINGS Hepatobiliary: No focal liver abnormality is seen. No gallstones, gallbladder wall thickening, or biliary dilatation. Pancreas: Unremarkable. No pancreatic ductal dilatation or surrounding inflammatory changes. Spleen: Normal in size without focal abnormality. Adrenals/Urinary Tract: Adrenal glands are unremarkable. Kidneys are normal, without renal calculi, focal lesion, or hydronephrosis. Bladder is unremarkable. Stomach/Bowel: Stomach is within normal limits. Appendix appears normal. No evidence of bowel wall thickening, distention, or inflammatory changes. Status post sigmoid colon resection and anastomosis. Vascular/Lymphatic: Mixed calcific atherosclerosis. No enlarged abdominal or pelvic lymph nodes. Reproductive: Status post hysterectomy. Other: No abdominal wall hernia or abnormality. No abdominopelvic ascites. Musculoskeletal: No acute or significant osseous findings. IMPRESSION: 1. Stable post treatment appearance of the paramedian right upper lobe and soft tissue of  the right hilum. 2. No evidence of metastatic disease in the chest, abdomen, or pelvis. 3. Previously described ground-glass opacities in the right lower lobe are resolved, consistent with resolved infection or inflammation. 4. Chronic, incidental, and postoperative findings as detailed above. Electronically Signed   By: Eddie Candle M.D.   On: 12/28/2018 11:41   Mr [name] IR Contrast  Result Date: 01/10/2019 CLINICAL DATA:  Lung cancer, treated brain Mets. Continued surveillance. No new symptoms. EXAM: MRI HEAD WITHOUT AND WITH CONTRAST TECHNIQUE: Multiplanar, multiecho pulse sequences of the brain and surrounding structures were obtained without and with intravenous contrast. CONTRAST:  38mL MULTIHANCE GADOBENATE DIMEGLUMINE 529 MG/ML IV SOLN COMPARISON:  Multiple priors, most  recent 07/06/2018. FINDINGS: Brain: No evidence of acute stroke, hemorrhage, hydrocephalus, or extra-axial fluid. Normal for age cerebral volume. Moderate T2 and FLAIR hyperintensities in the white matter, either small vessel disease or treatment related. Redemonstrated is a ill-defined enhancing lesion in the RIGHT parietal cortex, roughly 12 mm in size, with surrounding edema. No significant restriction. Mild central blood products of a chronic nature. The degree of enhancement is stable to slightly decreased from November 2019. No new lesions are seen. Vascular: Normal flow voids. Skull and upper cervical spine: Normal marrow signal. Sinuses/Orbits: Negative. Other: No acute findings IMPRESSION: Stable post treatment changes RIGHT parietal lobe. No significant worsening. No new lesions are identified. Continued surveillance is warranted. Electronically Signed   By: Staci Righter M.D.   On: 01/10/2019 13:44   Ct Abdomen Pelvis W Contrast  Result Date: 12/28/2018 CLINICAL DATA:  Lung cancer, metastatic, cervical cancer, colon resection EXAM: CT CHEST, ABDOMEN, AND PELVIS WITH CONTRAST TECHNIQUE: Multidetector CT imaging of the  chest, abdomen and pelvis was performed following the standard protocol during bolus administration of intravenous contrast. CONTRAST:  116mL ISOVUE-300 IOPAMIDOL (ISOVUE-300) INJECTION 61%, additional oral enteric contrast COMPARISON:  10/05/2018, 06/21/2018 FINDINGS: CT CHEST FINDINGS Cardiovascular: No significant vascular findings. Normal heart size. No pericardial effusion. Mediastinum/Nodes: No change in right hilar soft tissue. No mediastinal lymphadenopathy. Thyroid gland, trachea, and esophagus demonstrate no significant findings. Lungs/Pleura: Unchanged post treatment appearance of the para median right upper lobe. Mild underlying emphysema. No pleural effusion or pneumothorax. Musculoskeletal: No chest wall mass or suspicious bone lesions identified. CT ABDOMEN PELVIS FINDINGS Hepatobiliary: No focal liver abnormality is seen. No gallstones, gallbladder wall thickening, or biliary dilatation. Pancreas: Unremarkable. No pancreatic ductal dilatation or surrounding inflammatory changes. Spleen: Normal in size without focal abnormality. Adrenals/Urinary Tract: Adrenal glands are unremarkable. Kidneys are normal, without renal calculi, focal lesion, or hydronephrosis. Bladder is unremarkable. Stomach/Bowel: Stomach is within normal limits. Appendix appears normal. No evidence of bowel wall thickening, distention, or inflammatory changes. Status post sigmoid colon resection and anastomosis. Vascular/Lymphatic: Mixed calcific atherosclerosis. No enlarged abdominal or pelvic lymph nodes. Reproductive: Status post hysterectomy. Other: No abdominal wall hernia or abnormality. No abdominopelvic ascites. Musculoskeletal: No acute or significant osseous findings. IMPRESSION: 1. Stable post treatment appearance of the paramedian right upper lobe and soft tissue of the right hilum. 2. No evidence of metastatic disease in the chest, abdomen, or pelvis. 3. Previously described ground-glass opacities in the right lower  lobe are resolved, consistent with resolved infection or inflammation. 4. Chronic, incidental, and postoperative findings as detailed above. Electronically Signed   By: Eddie Candle M.D.   On: 12/28/2018 11:41    Impression/Plan: 1. 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 The patient is doing very well and I've let her know the results and discussion of her MRI of the brain. Conference recommendations were to continue to follow her in surveillance. Current NCCN guidelines recommend repeating imaging q 6 months indefinitely. She is in agreement with this plan. We will try Mychart communication for telemedicine encounter during her next visit. She will continue with her Alimta under the care of Dr. Julien Nordmann.     Given current concerns for patient exposure during the COVID-19 pandemic, this encounter was conducted via telephone.  The patient has given verbal consent for this type of encounter. The time spent during this encounter was 15 minutes and 50% of that time was spent  in the coordination of his care. The attendants for this meeting include Shona Simpson, [facility] and [name]  During the encounter, Shona Simpson [facility] was located at [facility] Radiation Oncology Department.  [name]  was located at home.     Carola Rhine, PAC

## 2019-01-22 ENCOUNTER — Inpatient Hospital Stay: Payer: Medicare Other

## 2019-01-22 ENCOUNTER — Encounter: Payer: Self-pay | Admitting: Internal Medicine

## 2019-01-22 ENCOUNTER — Inpatient Hospital Stay (HOSPITAL_BASED_OUTPATIENT_CLINIC_OR_DEPARTMENT_OTHER): Payer: Medicare Other | Admitting: Internal Medicine

## 2019-01-22 ENCOUNTER — Other Ambulatory Visit: Payer: Self-pay

## 2019-01-22 VITALS — BP 114/60 | HR 98 | Temp 98.5°F | Resp 18 | Ht 64.0 in | Wt 183.5 lb

## 2019-01-22 DIAGNOSIS — Z9221 Personal history of antineoplastic chemotherapy: Secondary | ICD-10-CM

## 2019-01-22 DIAGNOSIS — Z79899 Other long term (current) drug therapy: Secondary | ICD-10-CM | POA: Diagnosis not present

## 2019-01-22 DIAGNOSIS — Z923 Personal history of irradiation: Secondary | ICD-10-CM

## 2019-01-22 DIAGNOSIS — F419 Anxiety disorder, unspecified: Secondary | ICD-10-CM | POA: Diagnosis not present

## 2019-01-22 DIAGNOSIS — Z5112 Encounter for antineoplastic immunotherapy: Secondary | ICD-10-CM | POA: Diagnosis not present

## 2019-01-22 DIAGNOSIS — Z5111 Encounter for antineoplastic chemotherapy: Secondary | ICD-10-CM

## 2019-01-22 DIAGNOSIS — C3411 Malignant neoplasm of upper lobe, right bronchus or lung: Secondary | ICD-10-CM

## 2019-01-22 DIAGNOSIS — C7931 Secondary malignant neoplasm of brain: Secondary | ICD-10-CM | POA: Diagnosis not present

## 2019-01-22 LAB — CBC WITH DIFFERENTIAL/PLATELET
Abs Immature Granulocytes: 0.08 10*3/uL — ABNORMAL HIGH (ref 0.00–0.07)
Basophils Absolute: 0 10*3/uL (ref 0.0–0.1)
Basophils Relative: 0 %
Eosinophils Absolute: 0 10*3/uL (ref 0.0–0.5)
Eosinophils Relative: 0 %
HCT: 39.8 % (ref 36.0–46.0)
Hemoglobin: 12.5 g/dL (ref 12.0–15.0)
Immature Granulocytes: 1 %
Lymphocytes Relative: 5 %
Lymphs Abs: 0.6 10*3/uL — ABNORMAL LOW (ref 0.7–4.0)
MCH: 32.3 pg (ref 26.0–34.0)
MCHC: 31.4 g/dL (ref 30.0–36.0)
MCV: 102.8 fL — ABNORMAL HIGH (ref 80.0–100.0)
Monocytes Absolute: 0.6 10*3/uL (ref 0.1–1.0)
Monocytes Relative: 5 %
Neutro Abs: 10.6 10*3/uL — ABNORMAL HIGH (ref 1.7–7.7)
Neutrophils Relative %: 89 %
Platelets: 322 10*3/uL (ref 150–400)
RBC: 3.87 MIL/uL (ref 3.87–5.11)
RDW: 15.3 % (ref 11.5–15.5)
WBC: 11.9 10*3/uL — ABNORMAL HIGH (ref 4.0–10.5)
nRBC: 0 % (ref 0.0–0.2)

## 2019-01-22 LAB — COMPREHENSIVE METABOLIC PANEL
ALT: 13 U/L (ref 0–44)
AST: 10 U/L — ABNORMAL LOW (ref 15–41)
Albumin: 3.6 g/dL (ref 3.5–5.0)
Alkaline Phosphatase: 104 U/L (ref 38–126)
Anion gap: 14 (ref 5–15)
BUN: 13 mg/dL (ref 8–23)
CO2: 18 mmol/L — ABNORMAL LOW (ref 22–32)
Calcium: 9.7 mg/dL (ref 8.9–10.3)
Chloride: 107 mmol/L (ref 98–111)
Creatinine, Ser: 0.9 mg/dL (ref 0.44–1.00)
GFR calc Af Amer: >60 mL/min (ref >60)
GFR calc non Af Amer: >60 mL/min (ref >60)
Glucose, Bld: 163 mg/dL — ABNORMAL HIGH (ref 70–99)
Potassium: 4.2 mmol/L (ref 3.5–5.1)
Sodium: 139 mmol/L (ref 135–145)
Total Bilirubin: 0.4 mg/dL (ref 0.3–1.2)
Total Protein: 7 g/dL (ref 6.5–8.1)

## 2019-01-22 MED ORDER — DEXAMETHASONE SODIUM PHOSPHATE 10 MG/ML IJ SOLN
INTRAMUSCULAR | Status: AC
  Filled 2019-01-22: qty 1

## 2019-01-22 MED ORDER — ALPRAZOLAM 0.25 MG PO TABS: 0.2500 mg | ORAL_TABLET | Freq: Three times a day (TID) | ORAL | 0 refills | Status: DC | PRN

## 2019-01-22 MED ORDER — SODIUM CHLORIDE 0.9 % IV SOLN
Freq: Once | INTRAVENOUS | Status: DC
  Filled 2019-01-22: qty 250

## 2019-01-22 MED ORDER — ONDANSETRON HCL 8 MG PO TABS: 8.0000 mg | ORAL_TABLET | Freq: Once | ORAL | Status: DC

## 2019-01-22 MED ORDER — DEXAMETHASONE SODIUM PHOSPHATE 10 MG/ML IJ SOLN
10.0000 mg | Freq: Once | INTRAMUSCULAR | Status: AC
  Administered 2019-01-22: 10:00:00 10 mg via INTRAVENOUS

## 2019-01-22 MED ORDER — SODIUM CHLORIDE 0.9 % IV SOLN
500.0000 mg/m2 | Freq: Once | INTRAVENOUS | Status: AC
  Administered 2019-01-22: 900 mg via INTRAVENOUS
  Filled 2019-01-22: qty 20

## 2019-01-22 NOTE — Progress Notes (Signed)
[place] [phone]   Fax:(336) [phone]  OFFICE PROGRESS NOTE  [name], MD [address] [place] [address]  DIAGNOSIS: Stage IV (T2a, N0, M1b) non-small cell lung cancer, adenocarcinoma with negative EGFR and ALK mutations diagnosed in January 2015 and presented with right upper lobe lung mass in addition to a solitary brain metastasis.  PRIOR THERAPY: 1) Status post stereotactic radiotherapy to a solitary right parietal brain lesion under the care of Dr. [name] on 10/16/2013. 2) Status post palliative radiotherapy to the right lung tumor under the care of Dr. [name] completed on 12/05/2013. 3) Systemic chemotherapy with carboplatin for AUC of 5 and Alimta 500 mg/M2 every 3 weeks. First dose Jan 06 2014. Status post 6 cycles.  CURRENT THERAPY: Systemic chemotherapy with maintenance Alimta 500 MG/M2 every 3 weeks, status post 80 cycles.  INTERVAL HISTORY: Alexis [name] 68 y.o. female returns to the clinic today for follow-up visit.  The patient is feeling fine today with no concerning complaints.  She denied having any chest pain, shortness of breath, cough or hemoptysis.  She denied having any fever or chills.  She has no nausea, vomiting, diarrhea or constipation.  Her last MRI of the brain showed no concerning findings for disease progression.  The patient is here today for evaluation before starting cycle #81.   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  MEDICATIONS:  Current Outpatient Medications  Medication Sig Dispense Refill   acetaminophen (TYLENOL) 500 MG tablet Take 500 mg by mouth every 6 (six) hours as needed for mild pain or headache.  Reported on 11/02/2015     ALPRAZolam (XANAX) 0.25 MG tablet Take 1 tablet (0.25 mg total) by mouth 3 (three) times daily as needed. 30 tablet 0   Ascorbic Acid (VITAMIN C GUMMIE PO) Take 1 each by mouth every morning.     dexamethasone (DECADRON) 4 MG tablet TAKE 1 TABLET BY MOUTH TWICE A DAY THE DAY BEFORE, DAY OF AND DAY AFTER THE CHEMOTHERAPY EVERY 3 WKS 40 tablet 1   folic acid (FOLVITE) 1 MG tablet TAKE 1 TABLET BY MOUTH EVERY DAY 90 tablet 0   Multiple Vitamin (MULTIVITAMIN WITH MINERALS) TABS tablet Take 1 tablet by mouth every morning.     omeprazole (PRILOSEC) 20 MG capsule Take 1 capsule (20 mg total) by mouth daily. As needed for reflux or indigestion. 42 capsule PRN   ondansetron (ZOFRAN) 8 MG tablet Take one tab po before chemo. 30 tablet 0   OVER THE COUNTER MEDICATION Take 1 tablet by mouth every morning. (Vitamin A)     phenazopyridine (PYRIDIUM) 200 MG tablet Take 1 tablet (200 mg total) by mouth 3 (three) times daily as needed for pain. 15 tablet 0   prochlorperazine (COMPAZINE) 10 MG tablet Take 1 tablet (10 mg total) by mouth every 6 (six) hours as needed for nausea or vomiting. 60 tablet 0   senna-docusate (SENOKOT-S) 8.6-50 MG tablet Take 1 tablet by mouth daily. 30 tablet prn   sulfamethoxazole-trimethoprim (BACTRIM DS,SEPTRA DS) 800-160 MG tablet Take 1 tablet by mouth 2 (two) times daily. 14 tablet 0   No  current facility-administered medications for this visit.     SURGICAL HISTORY:  Past Surgical History:  Procedure Laterality Date   ABDOMINAL HYSTERECTOMY     COLOSTOMY TAKEDOWN N/A 07/10/2014   Procedure: LAPAROSCOPIC LYSIS OF ADHESIONS (90 MIN) LAPAROSCOPIC ASSISTED COLOSTOMY CLOSURE, RIGID PROCTOSIGMOIDOSCOPY;  Surgeon: Jackolyn Confer, MD;  Location: [facility];  Service: General;  Laterality: N/A;   LAPAROTOMY N/A 11/03/2013   Procedure: EXPLORATORY LAPAROTOMY, DRAINAGE OF INTRA  ABDOMINAL ABSCESSES, MOBILIZATION OF SPLENIC FLEXURE, SIGMOID COLECTOMY  WITH COLOSTOMY;  Surgeon: Odis Hollingshead, MD;  Location: [facility];  Service: General;  Laterality: N/A;   VIDEO BRONCHOSCOPY Bilateral 08/30/2013   Procedure: VIDEO BRONCHOSCOPY WITH FLUORO;  Surgeon: Tanda Rockers, MD;  Location: [facility];  Service: Cardiopulmonary;  Laterality: Bilateral;    REVIEW OF SYSTEMS:  A comprehensive review of systems was negative.   PHYSICAL EXAMINATION: General appearance: alert, cooperative and no distress Head: Normocephalic, without obvious abnormality, atraumatic Neck: no adenopathy, no JVD, supple, symmetrical, trachea midline and thyroid not enlarged, symmetric, no tenderness/mass/nodules Lymph nodes: Cervical, supraclavicular, and axillary nodes normal. Resp: clear to auscultation bilaterally Back: symmetric, no curvature. ROM normal. No CVA tenderness. Cardio: regular rate and rhythm, S1, S2 normal, no murmur, click, rub or gallop GI: soft, non-tender; bowel sounds normal; no masses,  no organomegaly Extremities: extremities normal, atraumatic, no cyanosis or edema  ECOG PERFORMANCE STATUS: 0 - Asymptomatic  Blood pressure 114/60, pulse 98, temperature 98.5 F (36.9 C), temperature source Oral, resp. rate 18, height 5' 4" (1.626 m), weight 183 lb 8 oz (83.2 kg), SpO2 98 %.  LABORATORY DATA: Lab Results  Component Value Date   WBC 11.9 (H) 01/22/2019   HGB 12.5 01/22/2019   HCT 39.8 01/22/2019   MCV 102.8 (H) 01/22/2019   PLT 322 01/22/2019      Chemistry      Component Value Date/Time   NA 141 12/31/2018 0852   NA 139 08/14/2017 0837   K 4.0 12/31/2018 0852   K 4.4 08/14/2017 0837   CL 109 12/31/2018 0852   CO2 18 (L) 12/31/2018 0852   CO2 24 08/14/2017 0837   BUN 17 12/31/2018 0852   BUN 13.3 08/14/2017 0837   CREATININE 0.82 12/31/2018 0852   CREATININE 0.75 12/10/2018 0858   CREATININE 0.8 08/14/2017 0837      Component Value Date/Time   CALCIUM 9.2 12/31/2018 0852   CALCIUM 9.3 08/14/2017 0837   ALKPHOS 93 12/31/2018  0852   ALKPHOS 107 08/14/2017 0837   AST 10 (L) 12/31/2018 0852   AST 12 (L) 12/10/2018 0858   AST 12 08/14/2017 0837   ALT 11 12/31/2018 0852   ALT 12 12/10/2018 0858   ALT 12 08/14/2017 0837   BILITOT 0.4 12/31/2018 0852   BILITOT 0.4 12/10/2018 0858   BILITOT 0.37 08/14/2017 0837       RADIOGRAPHIC STUDIES: Ct Chest W Contrast  Result Date: 12/28/2018 CLINICAL DATA:  Lung cancer, metastatic, cervical cancer, colon resection EXAM: CT CHEST, ABDOMEN, AND PELVIS WITH CONTRAST TECHNIQUE: Multidetector CT imaging of the chest, abdomen and pelvis was performed following the standard protocol during bolus administration of intravenous contrast. CONTRAST:  197m ISOVUE-300 IOPAMIDOL (ISOVUE-300) INJECTION 61%, additional oral enteric contrast COMPARISON:  10/05/2018, 06/21/2018 FINDINGS: CT CHEST FINDINGS Cardiovascular: No significant vascular findings. Normal heart size. No pericardial effusion. Mediastinum/Nodes: No change in right hilar soft tissue. No mediastinal lymphadenopathy. Thyroid gland, trachea, and esophagus demonstrate no significant findings. Lungs/Pleura: Unchanged post treatment appearance of the para  median right upper lobe. Mild underlying emphysema. No pleural effusion or pneumothorax. Musculoskeletal: No chest wall mass or suspicious bone lesions identified. CT ABDOMEN PELVIS FINDINGS Hepatobiliary: No focal liver abnormality is seen. No gallstones, gallbladder wall thickening, or biliary dilatation. Pancreas: Unremarkable. No pancreatic ductal dilatation or surrounding inflammatory changes. Spleen: Normal in size without focal abnormality. Adrenals/Urinary Tract: Adrenal glands are unremarkable. Kidneys are normal, without renal calculi, focal lesion, or hydronephrosis. Bladder is unremarkable. Stomach/Bowel: Stomach is within normal limits. Appendix appears normal. No evidence of bowel wall thickening, distention, or inflammatory changes. Status post sigmoid colon resection and  anastomosis. Vascular/Lymphatic: Mixed calcific atherosclerosis. No enlarged abdominal or pelvic lymph nodes. Reproductive: Status post hysterectomy. Other: No abdominal wall hernia or abnormality. No abdominopelvic ascites. Musculoskeletal: No acute or significant osseous findings. IMPRESSION: 1. Stable post treatment appearance of the paramedian right upper lobe and soft tissue of the right hilum. 2. No evidence of metastatic disease in the chest, abdomen, or pelvis. 3. Previously described ground-glass opacities in the right lower lobe are resolved, consistent with resolved infection or inflammation. 4. Chronic, incidental, and postoperative findings as detailed above. Electronically Signed   By: Eddie Candle M.D.   On: 12/28/2018 11:41   Mr [name] HE Contrast  Result Date: 01/10/2019 CLINICAL DATA:  Lung cancer, treated brain Mets. Continued surveillance. No new symptoms. EXAM: MRI HEAD WITHOUT AND WITH CONTRAST TECHNIQUE: Multiplanar, multiecho pulse sequences of the brain and surrounding structures were obtained without and with intravenous contrast. CONTRAST:  33m MULTIHANCE GADOBENATE DIMEGLUMINE 529 MG/ML IV SOLN COMPARISON:  Multiple priors, most recent 07/06/2018. FINDINGS: Brain: No evidence of acute stroke, hemorrhage, hydrocephalus, or extra-axial fluid. Normal for age cerebral volume. Moderate T2 and FLAIR hyperintensities in the white matter, either small vessel disease or treatment related. Redemonstrated is a ill-defined enhancing lesion in the RIGHT parietal cortex, roughly 12 mm in size, with surrounding edema. No significant restriction. Mild central blood products of a chronic nature. The degree of enhancement is stable to slightly decreased from November 2019. No new lesions are seen. Vascular: Normal flow voids. Skull and upper cervical spine: Normal marrow signal. Sinuses/Orbits: Negative. Other: No acute findings IMPRESSION: Stable post treatment changes RIGHT parietal lobe. No  significant worsening. No new lesions are identified. Continued surveillance is warranted. Electronically Signed   By: JStaci RighterM.D.   On: 01/10/2019 13:44   Ct Abdomen Pelvis W Contrast  Result Date: 12/28/2018 CLINICAL DATA:  Lung cancer, metastatic, cervical cancer, colon resection EXAM: CT CHEST, ABDOMEN, AND PELVIS WITH CONTRAST TECHNIQUE: Multidetector CT imaging of the chest, abdomen and pelvis was performed following the standard protocol during bolus administration of intravenous contrast. CONTRAST:  1025mISOVUE-300 IOPAMIDOL (ISOVUE-300) INJECTION 61%, additional oral enteric contrast COMPARISON:  10/05/2018, 06/21/2018 FINDINGS: CT CHEST FINDINGS Cardiovascular: No significant vascular findings. Normal heart size. No pericardial effusion. Mediastinum/Nodes: No change in right hilar soft tissue. No mediastinal lymphadenopathy. Thyroid gland, trachea, and esophagus demonstrate no significant findings. Lungs/Pleura: Unchanged post treatment appearance of the para median right upper lobe. Mild underlying emphysema. No pleural effusion or pneumothorax. Musculoskeletal: No chest wall mass or suspicious bone lesions identified. CT ABDOMEN PELVIS FINDINGS Hepatobiliary: No focal liver abnormality is seen. No gallstones, gallbladder wall thickening, or biliary dilatation. Pancreas: Unremarkable. No pancreatic ductal dilatation or surrounding inflammatory changes. Spleen: Normal in size without focal abnormality. Adrenals/Urinary Tract: Adrenal glands are unremarkable. Kidneys are normal, without renal calculi, focal lesion, or hydronephrosis. Bladder is unremarkable. Stomach/Bowel: Stomach is within normal limits.  Appendix appears normal. No evidence of bowel wall thickening, distention, or inflammatory changes. Status post sigmoid colon resection and anastomosis. Vascular/Lymphatic: Mixed calcific atherosclerosis. No enlarged abdominal or pelvic lymph nodes. Reproductive: Status post hysterectomy. Other:  No abdominal wall hernia or abnormality. No abdominopelvic ascites. Musculoskeletal: No acute or significant osseous findings. IMPRESSION: 1. Stable post treatment appearance of the paramedian right upper lobe and soft tissue of the right hilum. 2. No evidence of metastatic disease in the chest, abdomen, or pelvis. 3. Previously described ground-glass opacities in the right lower lobe are resolved, consistent with resolved infection or inflammation. 4. Chronic, incidental, and postoperative findings as detailed above. Electronically Signed   By: Eddie Candle M.D.   On: 12/28/2018 11:41    ASSESSMENT AND PLAN:  This is a very pleasant 68 years old white female with metastatic non-small cell lung cancer, adenocarcinoma status post induction systemic chemotherapy with carboplatin and Alimta with partial response. The patient is currently on maintenance treatment with single agent Alimta status post 80 cycles. The patient has been tolerating this treatment well with no concerning adverse effects. I recommended for her to proceed with cycle #81 today as planned. She will come back for follow-up visit in 3 weeks for evaluation before starting cycle #82. For anxiety, I gave her refill of Xanax. The patient was advised to call immediately if she has any concerning symptoms in the interval. The patient voices understanding of current disease status and treatment options and is in agreement with the current care plan. All questions were answered. The patient knows to call the clinic with any problems, questions or concerns. We can certainly see the patient much sooner if necessary.  Disclaimer: This note was dictated with voice recognition software. Similar sounding words can inadvertently be transcribed and may not be corrected upon review.

## 2019-01-22 NOTE — Patient Instructions (Signed)
[facility] Discharge Instructions for Patients Receiving Chemotherapy  Today you received the following chemotherapy agents :  Alimta.  To help prevent nausea and vomiting after your treatment, we encourage you to take your nausea medication as prescribed.   If you develop nausea and vomiting that is not controlled by your nausea medication, call the clinic.   BELOW ARE SYMPTOMS THAT SHOULD BE REPORTED IMMEDIATELY:  *FEVER GREATER THAN 100.5 F  *CHILLS WITH OR WITHOUT FEVER  NAUSEA AND VOMITING THAT IS NOT CONTROLLED WITH YOUR NAUSEA MEDICATION  *UNUSUAL SHORTNESS OF BREATH  *UNUSUAL BRUISING OR BLEEDING  TENDERNESS IN MOUTH AND THROAT WITH OR WITHOUT PRESENCE OF ULCERS  *URINARY PROBLEMS  *BOWEL PROBLEMS  UNUSUAL RASH Items with * indicate a potential emergency and should be followed up as soon as possible.  Feel free to call the clinic should you have any questions or concerns. The clinic phone number is [phone].  Please show the [facility] at check-in to the Emergency Department and triage nurse.

## 2019-01-24 ENCOUNTER — Other Ambulatory Visit: Payer: Self-pay | Admitting: Radiation Therapy

## 2019-01-24 DIAGNOSIS — C7949 Secondary malignant neoplasm of other parts of nervous system: Secondary | ICD-10-CM

## 2019-01-24 DIAGNOSIS — C7931 Secondary malignant neoplasm of brain: Secondary | ICD-10-CM

## 2019-02-11 ENCOUNTER — Other Ambulatory Visit: Payer: Self-pay

## 2019-02-11 ENCOUNTER — Inpatient Hospital Stay: Payer: Medicare Other

## 2019-02-11 ENCOUNTER — Inpatient Hospital Stay: Payer: Medicare Other | Attending: Internal Medicine

## 2019-02-11 ENCOUNTER — Inpatient Hospital Stay (HOSPITAL_BASED_OUTPATIENT_CLINIC_OR_DEPARTMENT_OTHER): Payer: Medicare Other | Admitting: Internal Medicine

## 2019-02-11 VITALS — BP 117/71 | HR 100 | Temp 97.8°F | Resp 18 | Ht 64.0 in | Wt 185.2 lb

## 2019-02-11 DIAGNOSIS — C3411 Malignant neoplasm of upper lobe, right bronchus or lung: Secondary | ICD-10-CM | POA: Diagnosis not present

## 2019-02-11 DIAGNOSIS — C7931 Secondary malignant neoplasm of brain: Secondary | ICD-10-CM | POA: Diagnosis not present

## 2019-02-11 DIAGNOSIS — F419 Anxiety disorder, unspecified: Secondary | ICD-10-CM | POA: Diagnosis not present

## 2019-02-11 DIAGNOSIS — Z5111 Encounter for antineoplastic chemotherapy: Secondary | ICD-10-CM | POA: Insufficient documentation

## 2019-02-11 LAB — CBC WITH DIFFERENTIAL/PLATELET
Abs Immature Granulocytes: 0.07 10*3/uL (ref 0.00–0.07)
Basophils Absolute: 0 10*3/uL (ref 0.0–0.1)
Basophils Relative: 0 %
Eosinophils Absolute: 0 10*3/uL (ref 0.0–0.5)
Eosinophils Relative: 0 %
HCT: 40.5 % (ref 36.0–46.0)
Hemoglobin: 13 g/dL (ref 12.0–15.0)
Immature Granulocytes: 1 %
Lymphocytes Relative: 4 %
Lymphs Abs: 0.4 10*3/uL — ABNORMAL LOW (ref 0.7–4.0)
MCH: 32.3 pg (ref 26.0–34.0)
MCHC: 32.1 g/dL (ref 30.0–36.0)
MCV: 100.7 fL — ABNORMAL HIGH (ref 80.0–100.0)
Monocytes Absolute: 0.3 10*3/uL (ref 0.1–1.0)
Monocytes Relative: 3 %
Neutro Abs: 9 10*3/uL — ABNORMAL HIGH (ref 1.7–7.7)
Neutrophils Relative %: 92 %
Platelets: 273 10*3/uL (ref 150–400)
RBC: 4.02 MIL/uL (ref 3.87–5.11)
RDW: 14.8 % (ref 11.5–15.5)
WBC: 9.7 10*3/uL (ref 4.0–10.5)
nRBC: 0 % (ref 0.0–0.2)

## 2019-02-11 LAB — COMPREHENSIVE METABOLIC PANEL
ALT: 19 U/L (ref 0–44)
AST: 13 U/L — ABNORMAL LOW (ref 15–41)
Albumin: 3.6 g/dL (ref 3.5–5.0)
Alkaline Phosphatase: 101 U/L (ref 38–126)
Anion gap: 11 (ref 5–15)
BUN: 18 mg/dL (ref 8–23)
CO2: 21 mmol/L — ABNORMAL LOW (ref 22–32)
Calcium: 9.7 mg/dL (ref 8.9–10.3)
Chloride: 106 mmol/L (ref 98–111)
Creatinine, Ser: 0.84 mg/dL (ref 0.44–1.00)
GFR calc Af Amer: >60 mL/min (ref >60)
GFR calc non Af Amer: >60 mL/min (ref >60)
Glucose, Bld: 218 mg/dL — ABNORMAL HIGH (ref 70–99)
Potassium: 4.6 mmol/L (ref 3.5–5.1)
Sodium: 138 mmol/L (ref 135–145)
Total Bilirubin: 0.4 mg/dL (ref 0.3–1.2)
Total Protein: 7 g/dL (ref 6.5–8.1)

## 2019-02-11 MED ORDER — DEXAMETHASONE SODIUM PHOSPHATE 10 MG/ML IJ SOLN
INTRAMUSCULAR | Status: AC
  Filled 2019-02-11: qty 1

## 2019-02-11 MED ORDER — DEXAMETHASONE SODIUM PHOSPHATE 10 MG/ML IJ SOLN
10.0000 mg | Freq: Once | INTRAMUSCULAR | Status: AC
  Administered 2019-02-11: 10 mg via INTRAVENOUS

## 2019-02-11 MED ORDER — SODIUM CHLORIDE 0.9 % IV SOLN
Freq: Once | INTRAVENOUS | Status: AC
  Administered 2019-02-11: 10:00:00 via INTRAVENOUS
  Filled 2019-02-11: qty 250

## 2019-02-11 MED ORDER — ONDANSETRON HCL 8 MG PO TABS: 8.0000 mg | ORAL_TABLET | Freq: Once | ORAL | Status: DC

## 2019-02-11 MED ORDER — SODIUM CHLORIDE 0.9 % IV SOLN
500.0000 mg/m2 | Freq: Once | INTRAVENOUS | Status: AC
  Administered 2019-02-11: 900 mg via INTRAVENOUS
  Filled 2019-02-11: qty 20

## 2019-02-11 NOTE — Patient Instructions (Signed)
[facility] Discharge Instructions for Patients Receiving Chemotherapy  Today you received the following chemotherapy agents :  Alimta.  To help prevent nausea and vomiting after your treatment, we encourage you to take your nausea medication as prescribed.   If you develop nausea and vomiting that is not controlled by your nausea medication, call the clinic.   BELOW ARE SYMPTOMS THAT SHOULD BE REPORTED IMMEDIATELY:  *FEVER GREATER THAN 100.5 F  *CHILLS WITH OR WITHOUT FEVER  NAUSEA AND VOMITING THAT IS NOT CONTROLLED WITH YOUR NAUSEA MEDICATION  *UNUSUAL SHORTNESS OF BREATH  *UNUSUAL BRUISING OR BLEEDING  TENDERNESS IN MOUTH AND THROAT WITH OR WITHOUT PRESENCE OF ULCERS  *URINARY PROBLEMS  *BOWEL PROBLEMS  UNUSUAL RASH Items with * indicate a potential emergency and should be followed up as soon as possible.  Feel free to call the clinic should you have any questions or concerns. The clinic phone number is [phone].  Please show the [facility] at check-in to the Emergency Department and triage nurse.

## 2019-02-11 NOTE — Progress Notes (Signed)
[place] Telephone:(336) [phone]   Fax:(336) [phone]  OFFICE PROGRESS NOTE  [name], MD [address] [place] [address]  DIAGNOSIS: Stage IV (T2a, N0, M1b) non-small cell lung cancer, adenocarcinoma with negative EGFR and ALK mutations diagnosed in January 2015 and presented with right upper lobe lung mass in addition to a solitary brain metastasis.  PRIOR THERAPY: 1) Status post stereotactic radiotherapy to a solitary right parietal brain lesion under the care of Dr. [name] on 10/16/2013. 2) Status post palliative radiotherapy to the right lung tumor under the care of Dr. [name] completed on 12/05/2013. 3) Systemic chemotherapy with carboplatin for AUC of 5 and Alimta 500 mg/M2 every 3 weeks. First dose Jan 06 2014. Status post 6 cycles.  CURRENT THERAPY: Systemic chemotherapy with maintenance Alimta 500 MG/M2 every 3 weeks, status post 81 cycles.  INTERVAL HISTORY: Alexis [name] 68 y.o. female returns to the clinic today for follow-up visit.  The patient is feeling fine today with no concerning complaints except mild fatigue last week.  She denied having any chest pain, shortness of breath, cough or hemoptysis.  She denied having any fever or chills.  She has no nausea, vomiting, diarrhea or constipation.  The patient has no weight loss or night sweats.  She continues to tolerate her maintenance treatment with Alimta fairly well.  She is here for evaluation before starting cycle number 82.   MEDICAL HISTORY: Past Medical History:  Diagnosis Date  . Anxiety   . Anxiety 06/20/2016  . Cervical cancer ([place])   . Encounter for antineoplastic chemotherapy 07/20/2015  . 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  MEDICATIONS:  Current Outpatient Medications  Medication Sig Dispense Refill  . acetaminophen (TYLENOL) 500 MG tablet Take 500 mg by  mouth every 6 (six) hours as needed for mild pain or headache. Reported on 11/02/2015    . ALPRAZolam (XANAX) 0.25 MG tablet Take 1 tablet (0.25 mg total) by mouth 3 (three) times daily as needed. 30 tablet 0  . Ascorbic Acid (VITAMIN C GUMMIE PO) Take 1 each by mouth every morning.    Marland Kitchen dexamethasone (DECADRON) 4 MG tablet TAKE 1 TABLET BY MOUTH TWICE A DAY THE DAY BEFORE, DAY OF AND DAY AFTER THE CHEMOTHERAPY EVERY 3 WKS 40 tablet 1  . folic acid (FOLVITE) 1 MG tablet TAKE 1 TABLET BY MOUTH EVERY DAY 90 tablet 0  . Multiple Vitamin (MULTIVITAMIN WITH MINERALS) TABS tablet Take 1 tablet by mouth every morning.    Marland Kitchen omeprazole (PRILOSEC) 20 MG capsule Take 1 capsule (20 mg total) by mouth daily. As needed for reflux or indigestion. 42 capsule PRN  . ondansetron (ZOFRAN) 8 MG tablet Take one tab po before chemo. 30 tablet 0  . OVER THE COUNTER MEDICATION Take 1 tablet by mouth every morning. (Vitamin A)    . phenazopyridine (PYRIDIUM) 200 MG tablet Take 1 tablet (200 mg total) by mouth 3 (three) times daily as needed for pain. 15 tablet 0  . prochlorperazine (COMPAZINE) 10 MG tablet Take 1 tablet (10 mg total) by mouth every 6 (six) hours as needed for nausea or vomiting. 60 tablet 0  . senna-docusate (SENOKOT-S) 8.6-50 MG tablet Take 1 tablet by mouth daily. 30 tablet prn  . sulfamethoxazole-trimethoprim (BACTRIM DS,SEPTRA DS) 800-160 MG tablet Take 1 tablet by  mouth 2 (two) times daily. 14 tablet 0   No current facility-administered medications for this visit.     SURGICAL HISTORY:  Past Surgical History:  Procedure Laterality Date  . ABDOMINAL HYSTERECTOMY    . COLOSTOMY TAKEDOWN N/A 07/10/2014   Procedure: LAPAROSCOPIC LYSIS OF ADHESIONS (90 MIN) LAPAROSCOPIC ASSISTED COLOSTOMY CLOSURE, RIGID PROCTOSIGMOIDOSCOPY;  Surgeon: Jackolyn Confer, MD;  Location: [facility];  Service: General;  Laterality: N/A;  . LAPAROTOMY N/A 11/03/2013   Procedure: EXPLORATORY LAPAROTOMY, DRAINAGE OF INTRA  ABDOMINAL  ABSCESSES, MOBILIZATION OF SPLENIC FLEXURE, SIGMOID COLECTOMY WITH COLOSTOMY;  Surgeon: Odis Hollingshead, MD;  Location: [facility];  Service: General;  Laterality: N/A;  . VIDEO BRONCHOSCOPY Bilateral 08/30/2013   Procedure: VIDEO BRONCHOSCOPY WITH FLUORO;  Surgeon: Tanda Rockers, MD;  Location: [facility];  Service: Cardiopulmonary;  Laterality: Bilateral;    REVIEW OF SYSTEMS:  A comprehensive review of systems was negative except for: Constitutional: positive for fatigue   PHYSICAL EXAMINATION: General appearance: alert, cooperative and no distress Head: Normocephalic, without obvious abnormality, atraumatic Neck: no adenopathy, no JVD, supple, symmetrical, trachea midline and thyroid not enlarged, symmetric, no tenderness/mass/nodules Lymph nodes: Cervical, supraclavicular, and axillary nodes normal. Resp: clear to auscultation bilaterally Back: symmetric, no curvature. ROM normal. No CVA tenderness. Cardio: regular rate and rhythm, S1, S2 normal, no murmur, click, rub or gallop GI: soft, non-tender; bowel sounds normal; no masses,  no organomegaly Extremities: extremities normal, atraumatic, no cyanosis or edema  ECOG PERFORMANCE STATUS: 0 - Asymptomatic  Blood pressure 117/71, pulse 100, temperature 97.8 F (36.6 C), temperature source Oral, resp. rate 18, height 5' 4"  (1.626 m), weight 185 lb 3.2 oz (84 kg), SpO2 99 %.  LABORATORY DATA: Lab Results  Component Value Date   WBC 9.7 02/11/2019   HGB 13.0 02/11/2019   HCT 40.5 02/11/2019   MCV 100.7 (H) 02/11/2019   PLT 273 02/11/2019      Chemistry      Component Value Date/Time   NA 139 01/22/2019 0844   NA 139 08/14/2017 0837   K 4.2 01/22/2019 0844   K 4.4 08/14/2017 0837   CL 107 01/22/2019 0844   CO2 18 (L) 01/22/2019 0844   CO2 24 08/14/2017 0837   BUN 13 01/22/2019 0844   BUN 13.3 08/14/2017 0837   CREATININE 0.90 01/22/2019 0844   CREATININE 0.75 12/10/2018 0858   CREATININE 0.8 08/14/2017 0837       Component Value Date/Time   CALCIUM 9.7 01/22/2019 0844   CALCIUM 9.3 08/14/2017 0837   ALKPHOS 104 01/22/2019 0844   ALKPHOS 107 08/14/2017 0837   AST 10 (L) 01/22/2019 0844   AST 12 (L) 12/10/2018 0858   AST 12 08/14/2017 0837   ALT 13 01/22/2019 0844   ALT 12 12/10/2018 0858   ALT 12 08/14/2017 0837   BILITOT 0.4 01/22/2019 0844   BILITOT 0.4 12/10/2018 0858   BILITOT 0.37 08/14/2017 0837       RADIOGRAPHIC STUDIES: No results found.  ASSESSMENT AND PLAN:  This is a very pleasant 68 years old white female with metastatic non-small cell lung cancer, adenocarcinoma status post induction systemic chemotherapy with carboplatin and Alimta with partial response. The patient is currently on maintenance treatment with single agent Alimta status post 81 cycles. The patient continues to tolerate her treatment well with no concerning adverse effects. I recommended for her to proceed with cycle #82 today as planned. For the anxiety she will continue her current treatment with Xanax. She will come  back for follow-up visit in 3 weeks for evaluation before cycle #83. She was advised to call immediately if she has any concerning symptoms in the interval. The patient voices understanding of current disease status and treatment options and is in agreement with the current care plan. All questions were answered. The patient knows to call the clinic with any problems, questions or concerns. We can certainly see the patient much sooner if necessary.  Disclaimer: This note was dictated with voice recognition software. Similar sounding words can inadvertently be transcribed and may not be corrected upon review.

## 2019-02-13 ENCOUNTER — Telehealth: Payer: Self-pay | Admitting: Internal Medicine

## 2019-02-13 NOTE — Telephone Encounter (Signed)
Next 2 treatments are already scheduled. Scheduled lab and f/u. Will add infusion once able to do so.

## 2019-03-04 ENCOUNTER — Inpatient Hospital Stay: Payer: Medicare Other

## 2019-03-04 ENCOUNTER — Inpatient Hospital Stay (HOSPITAL_BASED_OUTPATIENT_CLINIC_OR_DEPARTMENT_OTHER): Payer: Medicare Other | Admitting: Internal Medicine

## 2019-03-04 ENCOUNTER — Telehealth: Payer: Self-pay | Admitting: Internal Medicine

## 2019-03-04 ENCOUNTER — Encounter: Payer: Self-pay | Admitting: Internal Medicine

## 2019-03-04 ENCOUNTER — Inpatient Hospital Stay: Payer: Medicare Other | Attending: Internal Medicine

## 2019-03-04 ENCOUNTER — Other Ambulatory Visit: Payer: Self-pay

## 2019-03-04 VITALS — BP 147/56 | HR 100 | Temp 99.1°F | Resp 18 | Ht 64.0 in | Wt 186.0 lb

## 2019-03-04 DIAGNOSIS — F419 Anxiety disorder, unspecified: Secondary | ICD-10-CM | POA: Insufficient documentation

## 2019-03-04 DIAGNOSIS — Z79899 Other long term (current) drug therapy: Secondary | ICD-10-CM | POA: Insufficient documentation

## 2019-03-04 DIAGNOSIS — C3411 Malignant neoplasm of upper lobe, right bronchus or lung: Secondary | ICD-10-CM

## 2019-03-04 DIAGNOSIS — Z5111 Encounter for antineoplastic chemotherapy: Secondary | ICD-10-CM | POA: Diagnosis not present

## 2019-03-04 DIAGNOSIS — Z923 Personal history of irradiation: Secondary | ICD-10-CM | POA: Diagnosis not present

## 2019-03-04 DIAGNOSIS — C7931 Secondary malignant neoplasm of brain: Secondary | ICD-10-CM | POA: Diagnosis not present

## 2019-03-04 DIAGNOSIS — C3490 Malignant neoplasm of unspecified part of unspecified bronchus or lung: Secondary | ICD-10-CM

## 2019-03-04 LAB — COMPREHENSIVE METABOLIC PANEL
ALT: 14 U/L (ref 0–44)
AST: 11 U/L — ABNORMAL LOW (ref 15–41)
Albumin: 3.3 g/dL — ABNORMAL LOW (ref 3.5–5.0)
Alkaline Phosphatase: 86 U/L (ref 38–126)
Anion gap: 13 (ref 5–15)
BUN: 14 mg/dL (ref 8–23)
CO2: 19 mmol/L — ABNORMAL LOW (ref 22–32)
Calcium: 9 mg/dL (ref 8.9–10.3)
Chloride: 109 mmol/L (ref 98–111)
Creatinine, Ser: 0.79 mg/dL (ref 0.44–1.00)
GFR calc Af Amer: >60 mL/min (ref >60)
GFR calc non Af Amer: >60 mL/min (ref >60)
Glucose, Bld: 164 mg/dL — ABNORMAL HIGH (ref 70–99)
Potassium: 3.9 mmol/L (ref 3.5–5.1)
Sodium: 141 mmol/L (ref 135–145)
Total Bilirubin: 0.4 mg/dL (ref 0.3–1.2)
Total Protein: 6.7 g/dL (ref 6.5–8.1)

## 2019-03-04 LAB — CBC WITH DIFFERENTIAL/PLATELET
Abs Immature Granulocytes: 0.1 10*3/uL — ABNORMAL HIGH (ref 0.00–0.07)
Basophils Absolute: 0 10*3/uL (ref 0.0–0.1)
Basophils Relative: 0 %
Eosinophils Absolute: 0 10*3/uL (ref 0.0–0.5)
Eosinophils Relative: 0 %
HCT: 37.1 % (ref 36.0–46.0)
Hemoglobin: 12 g/dL (ref 12.0–15.0)
Immature Granulocytes: 1 %
Lymphocytes Relative: 5 %
Lymphs Abs: 0.5 10*3/uL — ABNORMAL LOW (ref 0.7–4.0)
MCH: 32.2 pg (ref 26.0–34.0)
MCHC: 32.3 g/dL (ref 30.0–36.0)
MCV: 99.5 fL (ref 80.0–100.0)
Monocytes Absolute: 1 10*3/uL (ref 0.1–1.0)
Monocytes Relative: 9 %
Neutro Abs: 8.8 10*3/uL — ABNORMAL HIGH (ref 1.7–7.7)
Neutrophils Relative %: 85 %
Platelets: 318 10*3/uL (ref 150–400)
RBC: 3.73 MIL/uL — ABNORMAL LOW (ref 3.87–5.11)
RDW: 14.9 % (ref 11.5–15.5)
WBC: 10.4 10*3/uL (ref 4.0–10.5)
nRBC: 0 % (ref 0.0–0.2)

## 2019-03-04 MED ORDER — ALPRAZOLAM 0.25 MG PO TABS: 0.2500 mg | ORAL_TABLET | Freq: Three times a day (TID) | ORAL | 0 refills | Status: DC | PRN

## 2019-03-04 MED ORDER — CYANOCOBALAMIN 1000 MCG/ML IJ SOLN
1000.0000 ug | Freq: Once | INTRAMUSCULAR | Status: AC
  Administered 2019-03-04: 1000 ug via INTRAMUSCULAR

## 2019-03-04 MED ORDER — CYANOCOBALAMIN 1000 MCG/ML IJ SOLN
INTRAMUSCULAR | Status: AC
  Filled 2019-03-04: qty 1

## 2019-03-04 MED ORDER — SODIUM CHLORIDE 0.9 % IV SOLN
500.0000 mg/m2 | Freq: Once | INTRAVENOUS | Status: AC
  Administered 2019-03-04: 900 mg via INTRAVENOUS
  Filled 2019-03-04: qty 20

## 2019-03-04 MED ORDER — SODIUM CHLORIDE 0.9 % IV SOLN
Freq: Once | INTRAVENOUS | Status: AC
  Administered 2019-03-04: 10:00:00 via INTRAVENOUS
  Filled 2019-03-04: qty 250

## 2019-03-04 MED ORDER — ONDANSETRON HCL 8 MG PO TABS: 8.0000 mg | ORAL_TABLET | Freq: Once | ORAL | Status: DC

## 2019-03-04 MED ORDER — DEXAMETHASONE SODIUM PHOSPHATE 10 MG/ML IJ SOLN
INTRAMUSCULAR | Status: AC
  Filled 2019-03-04: qty 1

## 2019-03-04 MED ORDER — DEXAMETHASONE SODIUM PHOSPHATE 10 MG/ML IJ SOLN
10.0000 mg | Freq: Once | INTRAMUSCULAR | Status: AC
  Administered 2019-03-04: 10 mg via INTRAVENOUS

## 2019-03-04 NOTE — Telephone Encounter (Signed)
appts already scheduled . - gave patient. avs and calender

## 2019-03-04 NOTE — Progress Notes (Signed)
[facility] [phone]   Fax:(336) [phone]  OFFICE PROGRESS NOTE  [name], MD [address] [place] [address]  DIAGNOSIS: Stage IV (T2a, N0, M1b) non-small cell lung cancer, adenocarcinoma with negative EGFR and ALK mutations diagnosed in January 2015 and presented with right upper lobe lung mass in addition to a solitary brain metastasis.  PRIOR THERAPY: 1) Status post stereotactic radiotherapy to a solitary right parietal brain lesion under the care of Dr. [name] on 10/16/2013. 2) Status post palliative radiotherapy to the right lung tumor under the care of Dr. [name] completed on 12/05/2013. 3) Systemic chemotherapy with carboplatin for AUC of 5 and Alimta 500 mg/M2 every 3 weeks. First dose Jan 06 2014. Status post 6 cycles.  CURRENT THERAPY: Systemic chemotherapy with maintenance Alimta 500 MG/M2 every 3 weeks, status post 82 cycles.  INTERVAL HISTORY: Alexis [name] 68 y.o. female returns to the clinic today for follow-up visit.  The patient is feeling fine today with no concerning complaints.  She denied having any chest pain, shortness of breath, cough or hemoptysis.  She denied having any fever or chills.  She has no nausea, vomiting, diarrhea or constipation.  She denied having any headache or visual changes.  The patient continues to tolerate her maintenance treatment with Alimta fairly well.  She is here today for evaluation before starting cycle #83.  She is requesting refill of Xanax.   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  MEDICATIONS:  Current Outpatient Medications  Medication Sig Dispense Refill   acetaminophen (TYLENOL) 500 MG tablet  Take 500 mg by mouth every 6 (six) hours as needed for mild pain or headache. Reported on 11/02/2015     ALPRAZolam (XANAX) 0.25 MG tablet Take 1 tablet (0.25 mg total) by mouth 3 (three) times daily as needed. 30 tablet 0   Ascorbic Acid (VITAMIN C GUMMIE PO) Take 1 each by mouth every morning.     folic acid (FOLVITE) 1 MG tablet TAKE 1 TABLET BY MOUTH EVERY DAY 90 tablet 0   Multiple Vitamin (MULTIVITAMIN WITH MINERALS) TABS tablet Take 1 tablet by mouth every morning.     omeprazole (PRILOSEC) 20 MG capsule Take 1 capsule (20 mg total) by mouth daily. As needed for reflux or indigestion. 42 capsule PRN   ondansetron (ZOFRAN) 8 MG tablet Take one tab po before chemo. 30 tablet 0   OVER THE COUNTER MEDICATION Take 1 tablet by mouth every morning. (Vitamin A)     dexamethasone (DECADRON) 4 MG tablet TAKE 1 TABLET BY MOUTH TWICE A DAY THE DAY BEFORE, DAY OF AND DAY AFTER THE CHEMOTHERAPY EVERY 3 WKS (Patient not taking: Reported on 03/04/2019) 40 tablet 1   phenazopyridine (PYRIDIUM) 200 MG tablet Take 1 tablet (200 mg total) by mouth 3 (three) times daily as needed for pain. (Patient not taking: Reported on 03/04/2019) 15 tablet 0   prochlorperazine (COMPAZINE) 10 MG tablet Take 1 tablet (10 mg total) by mouth every 6 (six) hours as needed for nausea or vomiting. (Patient not taking: Reported on 03/04/2019) 60 tablet 0   senna-docusate (SENOKOT-S) 8.6-50 MG tablet Take 1  tablet by mouth daily. (Patient not taking: Reported on 03/04/2019) 30 tablet prn   No current facility-administered medications for this visit.     SURGICAL HISTORY:  Past Surgical History:  Procedure Laterality Date   ABDOMINAL HYSTERECTOMY     COLOSTOMY TAKEDOWN N/A 07/10/2014   Procedure: LAPAROSCOPIC LYSIS OF ADHESIONS (90 MIN) LAPAROSCOPIC ASSISTED COLOSTOMY CLOSURE, RIGID PROCTOSIGMOIDOSCOPY;  Surgeon: Jackolyn Confer, MD;  Location: [facility];  Service: General;  Laterality: N/A;   LAPAROTOMY N/A 11/03/2013    Procedure: EXPLORATORY LAPAROTOMY, DRAINAGE OF INTRA  ABDOMINAL ABSCESSES, MOBILIZATION OF SPLENIC FLEXURE, SIGMOID COLECTOMY WITH COLOSTOMY;  Surgeon: Odis Hollingshead, MD;  Location: [facility];  Service: General;  Laterality: N/A;   VIDEO BRONCHOSCOPY Bilateral 08/30/2013   Procedure: VIDEO BRONCHOSCOPY WITH FLUORO;  Surgeon: Tanda Rockers, MD;  Location: [facility];  Service: Cardiopulmonary;  Laterality: Bilateral;    REVIEW OF SYSTEMS:  A comprehensive review of systems was negative except for: Behavioral/Psych: positive for anxiety   PHYSICAL EXAMINATION: General appearance: alert, cooperative and no distress Head: Normocephalic, without obvious abnormality, atraumatic Neck: no adenopathy, no JVD, supple, symmetrical, trachea midline and thyroid not enlarged, symmetric, no tenderness/mass/nodules Lymph nodes: Cervical, supraclavicular, and axillary nodes normal. Resp: clear to auscultation bilaterally Back: symmetric, no curvature. ROM normal. No CVA tenderness. Cardio: regular rate and rhythm, S1, S2 normal, no murmur, click, rub or gallop GI: soft, non-tender; bowel sounds normal; no masses,  no organomegaly Extremities: extremities normal, atraumatic, no cyanosis or edema  ECOG PERFORMANCE STATUS: 0 - Asymptomatic  Blood pressure (!) 147/56, pulse 100, temperature 99.1 F (37.3 C), temperature source Oral, resp. rate 18, height '5\' 4"'$  (1.626 m), weight 186 lb (84.4 kg), SpO2 98 %.  LABORATORY DATA: Lab Results  Component Value Date   WBC 10.4 03/04/2019   HGB 12.0 03/04/2019   HCT 37.1 03/04/2019   MCV 99.5 03/04/2019   PLT 318 03/04/2019      Chemistry      Component Value Date/Time   NA 141 03/04/2019 0833   NA 139 08/14/2017 0837   K 3.9 03/04/2019 0833   K 4.4 08/14/2017 0837   CL 109 03/04/2019 0833   CO2 19 (L) 03/04/2019 0833   CO2 24 08/14/2017 0837   BUN 14 03/04/2019 0833   BUN 13.3 08/14/2017 0837   CREATININE 0.79 03/04/2019 0833   CREATININE 0.75  12/10/2018 0858   CREATININE 0.8 08/14/2017 0837      Component Value Date/Time   CALCIUM 9.0 03/04/2019 0833   CALCIUM 9.3 08/14/2017 0837   ALKPHOS 86 03/04/2019 0833   ALKPHOS 107 08/14/2017 0837   AST 11 (L) 03/04/2019 0833   AST 12 (L) 12/10/2018 0858   AST 12 08/14/2017 0837   ALT 14 03/04/2019 0833   ALT 12 12/10/2018 0858   ALT 12 08/14/2017 0837   BILITOT 0.4 03/04/2019 0833   BILITOT 0.4 12/10/2018 0858   BILITOT 0.37 08/14/2017 0837       RADIOGRAPHIC STUDIES: No results found.  ASSESSMENT AND PLAN:  This is a very pleasant 68 years old white female with metastatic non-small cell lung cancer, adenocarcinoma status post induction systemic chemotherapy with carboplatin and Alimta with partial response. The patient is currently on maintenance treatment with single agent Alimta status post 82 cycles. She has been tolerating this treatment well with no concerning adverse effects. I recommended for the patient to proceed with cycle #83 today as planned. I will see her back for follow-up visit in 3 weeks  for evaluation after repeating CT scan of the chest, abdomen and pelvis for restaging of her disease. For the anxiety she will continue her current treatment with Xanax. The patient was advised to call immediately if she has any concerning symptoms in the interval. The patient voices understanding of current disease status and treatment options and is in agreement with the current care plan. All questions were answered. The patient knows to call the clinic with any problems, questions or concerns. We can certainly see the patient much sooner if necessary.  Disclaimer: This note was dictated with voice recognition software. Similar sounding words can inadvertently be transcribed and may not be corrected upon review.

## 2019-03-22 ENCOUNTER — Ambulatory Visit
Admission: RE | Admit: 2019-03-22 | Discharge: 2019-03-22 | Disposition: A | Discharge status: Home / Self Care | Payer: Medicare Other | Source: Ambulatory Visit | Attending: Internal Medicine | Admitting: Internal Medicine

## 2019-03-22 DIAGNOSIS — K5730 Diverticulosis of large intestine without perforation or abscess without bleeding: Secondary | ICD-10-CM | POA: Diagnosis not present

## 2019-03-22 DIAGNOSIS — C3490 Malignant neoplasm of unspecified part of unspecified bronchus or lung: Secondary | ICD-10-CM

## 2019-03-22 MED ORDER — IOPAMIDOL (ISOVUE-300) INJECTION 61%
100.0000 mL | Freq: Once | INTRAVENOUS | Status: AC | PRN
  Administered 2019-03-22: 100 mL via INTRAVENOUS

## 2019-03-25 ENCOUNTER — Inpatient Hospital Stay: Payer: Medicare Other

## 2019-03-25 ENCOUNTER — Inpatient Hospital Stay (HOSPITAL_BASED_OUTPATIENT_CLINIC_OR_DEPARTMENT_OTHER): Payer: Medicare Other | Admitting: Internal Medicine

## 2019-03-25 ENCOUNTER — Telehealth: Payer: Self-pay | Admitting: Internal Medicine

## 2019-03-25 ENCOUNTER — Other Ambulatory Visit: Payer: Self-pay

## 2019-03-25 ENCOUNTER — Encounter: Payer: Self-pay | Admitting: Internal Medicine

## 2019-03-25 VITALS — BP 113/67 | HR 89 | Temp 98.3°F | Resp 18 | Ht 64.0 in | Wt 184.2 lb

## 2019-03-25 DIAGNOSIS — Z5111 Encounter for antineoplastic chemotherapy: Secondary | ICD-10-CM

## 2019-03-25 DIAGNOSIS — F419 Anxiety disorder, unspecified: Secondary | ICD-10-CM | POA: Diagnosis not present

## 2019-03-25 DIAGNOSIS — Z79899 Other long term (current) drug therapy: Secondary | ICD-10-CM | POA: Diagnosis not present

## 2019-03-25 DIAGNOSIS — C7949 Secondary malignant neoplasm of other parts of nervous system: Secondary | ICD-10-CM

## 2019-03-25 DIAGNOSIS — C3411 Malignant neoplasm of upper lobe, right bronchus or lung: Secondary | ICD-10-CM

## 2019-03-25 DIAGNOSIS — Z923 Personal history of irradiation: Secondary | ICD-10-CM | POA: Diagnosis not present

## 2019-03-25 DIAGNOSIS — C7931 Secondary malignant neoplasm of brain: Secondary | ICD-10-CM

## 2019-03-25 LAB — COMPREHENSIVE METABOLIC PANEL
ALT: 15 U/L (ref 0–44)
AST: 11 U/L — ABNORMAL LOW (ref 15–41)
Albumin: 3.8 g/dL (ref 3.5–5.0)
Alkaline Phosphatase: 108 U/L (ref 38–126)
Anion gap: 9 (ref 5–15)
BUN: 18 mg/dL (ref 8–23)
CO2: 22 mmol/L (ref 22–32)
Calcium: 9.9 mg/dL (ref 8.9–10.3)
Chloride: 107 mmol/L (ref 98–111)
Creatinine, Ser: 0.9 mg/dL (ref 0.44–1.00)
GFR calc Af Amer: >60 mL/min (ref >60)
GFR calc non Af Amer: >60 mL/min (ref >60)
Glucose, Bld: 175 mg/dL — ABNORMAL HIGH (ref 70–99)
Potassium: 4.5 mmol/L (ref 3.5–5.1)
Sodium: 138 mmol/L (ref 135–145)
Total Bilirubin: 0.4 mg/dL (ref 0.3–1.2)
Total Protein: 7.3 g/dL (ref 6.5–8.1)

## 2019-03-25 LAB — CBC WITH DIFFERENTIAL/PLATELET
Abs Immature Granulocytes: 0.1 10*3/uL — ABNORMAL HIGH (ref 0.00–0.07)
Basophils Absolute: 0 10*3/uL (ref 0.0–0.1)
Basophils Relative: 0 %
Eosinophils Absolute: 0 10*3/uL (ref 0.0–0.5)
Eosinophils Relative: 0 %
HCT: 39.7 % (ref 36.0–46.0)
Hemoglobin: 12.6 g/dL (ref 12.0–15.0)
Immature Granulocytes: 1 %
Lymphocytes Relative: 3 %
Lymphs Abs: 0.4 10*3/uL — ABNORMAL LOW (ref 0.7–4.0)
MCH: 32.1 pg (ref 26.0–34.0)
MCHC: 31.7 g/dL (ref 30.0–36.0)
MCV: 101.3 fL — ABNORMAL HIGH (ref 80.0–100.0)
Monocytes Absolute: 0.5 10*3/uL (ref 0.1–1.0)
Monocytes Relative: 4 %
Neutro Abs: 12 10*3/uL — ABNORMAL HIGH (ref 1.7–7.7)
Neutrophils Relative %: 92 %
Platelets: 291 10*3/uL (ref 150–400)
RBC: 3.92 MIL/uL (ref 3.87–5.11)
RDW: 15.4 % (ref 11.5–15.5)
WBC: 13 10*3/uL — ABNORMAL HIGH (ref 4.0–10.5)
nRBC: 0 % (ref 0.0–0.2)

## 2019-03-25 MED ORDER — DEXAMETHASONE SODIUM PHOSPHATE 10 MG/ML IJ SOLN
10.0000 mg | Freq: Once | INTRAMUSCULAR | Status: AC
  Administered 2019-03-25: 10 mg via INTRAVENOUS

## 2019-03-25 MED ORDER — ONDANSETRON HCL 8 MG PO TABS
ORAL_TABLET | ORAL | Status: AC
  Filled 2019-03-25: qty 1

## 2019-03-25 MED ORDER — ONDANSETRON HCL 8 MG PO TABS: 8.0000 mg | ORAL_TABLET | Freq: Once | ORAL | Status: DC

## 2019-03-25 MED ORDER — DEXAMETHASONE SODIUM PHOSPHATE 10 MG/ML IJ SOLN
INTRAMUSCULAR | Status: AC
  Filled 2019-03-25: qty 1

## 2019-03-25 MED ORDER — SODIUM CHLORIDE 0.9 % IV SOLN
500.0000 mg/m2 | Freq: Once | INTRAVENOUS | Status: AC
  Administered 2019-03-25: 900 mg via INTRAVENOUS
  Filled 2019-03-25: qty 20

## 2019-03-25 MED ORDER — SODIUM CHLORIDE 0.9 % IV SOLN
Freq: Once | INTRAVENOUS | Status: AC
  Administered 2019-03-25: 10:00:00 via INTRAVENOUS
  Filled 2019-03-25: qty 250

## 2019-03-25 NOTE — Progress Notes (Signed)
[facility] Telephone:(336) [phone]   Fax:(336) [phone]  OFFICE PROGRESS NOTE  Alexis Roers, MD [address] [place] [address]  DIAGNOSIS: Stage IV (T2a, N0, M1b) non-small cell lung cancer, adenocarcinoma with negative EGFR and ALK mutations diagnosed in January 2015 and presented with right upper lobe lung mass in addition to a solitary brain metastasis.  PRIOR THERAPY: 1) Status post stereotactic radiotherapy to a solitary right parietal brain lesion under the care of Dr. [name] on 10/16/2013. 2) Status post palliative radiotherapy to the right lung tumor under the care of Dr. [name] completed on 12/05/2013. 3) Systemic chemotherapy with carboplatin for AUC of 5 and Alimta 500 mg/M2 every 3 weeks. First dose Jan 06 2014. Status post 6 cycles.  CURRENT THERAPY: Systemic chemotherapy with maintenance Alimta 500 MG/M2 every 3 weeks, status post 83 cycles.  INTERVAL HISTORY: Alexis [name] 68 y.o. female returns to the clinic today for follow-up visit.  The patient is feeling fine today with no concerning complaints.  She denied having any chest pain, shortness of breath, cough or hemoptysis.  She denied having any weight loss or night sweats.  She has no nausea, vomiting, diarrhea or constipation.  She denied having any headache or visual changes.  She continues to tolerate her treatment with maintenance Alimta fairly well.  The patient had repeat CT scan of the chest, abdomen pelvis performed recently and she is here for evaluation and discussion of her scan results.    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  MEDICATIONS:  Current Outpatient Medications  Medication Sig  Dispense Refill   acetaminophen (TYLENOL) 500 MG tablet Take 500 mg by mouth every 6 (six) hours as needed for mild pain or headache. Reported on 11/02/2015     ALPRAZolam (XANAX) 0.25 MG tablet Take 1 tablet (0.25 mg total) by mouth 3 (three) times daily as needed. 30 tablet 0   Ascorbic Acid (VITAMIN C GUMMIE PO) Take 1 each by mouth every morning.     dexamethasone (DECADRON) 4 MG tablet TAKE 1 TABLET BY MOUTH TWICE A DAY THE DAY BEFORE, DAY OF AND DAY AFTER THE CHEMOTHERAPY EVERY 3 WKS (Patient not taking: Reported on 03/04/2019) 40 tablet 1   folic acid (FOLVITE) 1 MG tablet TAKE 1 TABLET BY MOUTH EVERY DAY 90 tablet 0   Multiple Vitamin (MULTIVITAMIN WITH MINERALS) TABS tablet Take 1 tablet by mouth every morning.     omeprazole (PRILOSEC) 20 MG capsule Take 1 capsule (20 mg total) by mouth daily. As needed for reflux or indigestion. 42 capsule PRN   ondansetron (ZOFRAN) 8 MG tablet Take one tab po before chemo. 30 tablet 0   OVER THE COUNTER MEDICATION Take 1 tablet by mouth every morning. (Vitamin A)     phenazopyridine (PYRIDIUM) 200 MG tablet Take 1 tablet (200 mg total) by mouth 3 (three) times daily as needed for pain. (Patient not taking: Reported on 03/04/2019) 15 tablet 0   prochlorperazine (COMPAZINE) 10 MG tablet Take 1 tablet (10 mg total) by mouth every 6 (six) hours as needed for nausea or vomiting. (Patient not taking: Reported on 03/04/2019) 60 tablet  0   senna-docusate (SENOKOT-S) 8.6-50 MG tablet Take 1 tablet by mouth daily. (Patient not taking: Reported on 03/04/2019) 30 tablet prn   No current facility-administered medications for this visit.     SURGICAL HISTORY:  Past Surgical History:  Procedure Laterality Date   ABDOMINAL HYSTERECTOMY     COLOSTOMY TAKEDOWN N/A 07/10/2014   Procedure: LAPAROSCOPIC LYSIS OF ADHESIONS (90 MIN) LAPAROSCOPIC ASSISTED COLOSTOMY CLOSURE, RIGID PROCTOSIGMOIDOSCOPY;  Surgeon: Jackolyn Confer, MD;  Location: [facility];  Service: General;   Laterality: N/A;   LAPAROTOMY N/A 11/03/2013   Procedure: EXPLORATORY LAPAROTOMY, DRAINAGE OF INTRA  ABDOMINAL ABSCESSES, MOBILIZATION OF SPLENIC FLEXURE, SIGMOID COLECTOMY WITH COLOSTOMY;  Surgeon: Odis Hollingshead, MD;  Location: [facility];  Service: General;  Laterality: N/A;   VIDEO BRONCHOSCOPY Bilateral 08/30/2013   Procedure: VIDEO BRONCHOSCOPY WITH FLUORO;  Surgeon: Tanda Rockers, MD;  Location: [facility];  Service: Cardiopulmonary;  Laterality: Bilateral;    REVIEW OF SYSTEMS:  Constitutional: negative Eyes: negative Ears, nose, mouth, throat, and face: negative Respiratory: negative Cardiovascular: negative Gastrointestinal: negative Genitourinary:negative Integument/breast: negative Hematologic/lymphatic: negative Musculoskeletal:negative Neurological: negative Behavioral/Psych: negative Endocrine: negative Allergic/Immunologic: negative   PHYSICAL EXAMINATION: General appearance: alert, cooperative and no distress Head: Normocephalic, without obvious abnormality, atraumatic Neck: no adenopathy, no JVD, supple, symmetrical, trachea midline and thyroid not enlarged, symmetric, no tenderness/mass/nodules Lymph nodes: Cervical, supraclavicular, and axillary nodes normal. Resp: clear to auscultation bilaterally Back: symmetric, no curvature. ROM normal. No CVA tenderness. Cardio: regular rate and rhythm, S1, S2 normal, no murmur, click, rub or gallop GI: soft, non-tender; bowel sounds normal; no masses,  no organomegaly Extremities: extremities normal, atraumatic, no cyanosis or edema Neurologic: Alert and oriented X 3, normal strength and tone. Normal symmetric reflexes. Normal coordination and gait  ECOG PERFORMANCE STATUS: 0 - Asymptomatic  Blood pressure 113/67, pulse 89, temperature 98.3 F (36.8 C), temperature source Oral, resp. rate 18, height 5' 4"  (1.626 m), weight 184 lb 3.2 oz (83.6 kg), SpO2 99 %.  LABORATORY DATA: Lab Results  Component Value Date   WBC  13.0 (H) 03/25/2019   HGB 12.6 03/25/2019   HCT 39.7 03/25/2019   MCV 101.3 (H) 03/25/2019   PLT 291 03/25/2019      Chemistry      Component Value Date/Time   NA 141 03/04/2019 0833   NA 139 08/14/2017 0837   K 3.9 03/04/2019 0833   K 4.4 08/14/2017 0837   CL 109 03/04/2019 0833   CO2 19 (L) 03/04/2019 0833   CO2 24 08/14/2017 0837   BUN 14 03/04/2019 0833   BUN 13.3 08/14/2017 0837   CREATININE 0.79 03/04/2019 0833   CREATININE 0.75 12/10/2018 0858   CREATININE 0.8 08/14/2017 0837      Component Value Date/Time   CALCIUM 9.0 03/04/2019 0833   CALCIUM 9.3 08/14/2017 0837   ALKPHOS 86 03/04/2019 0833   ALKPHOS 107 08/14/2017 0837   AST 11 (L) 03/04/2019 0833   AST 12 (L) 12/10/2018 0858   AST 12 08/14/2017 0837   ALT 14 03/04/2019 0833   ALT 12 12/10/2018 0858   ALT 12 08/14/2017 0837   BILITOT 0.4 03/04/2019 0833   BILITOT 0.4 12/10/2018 0858   BILITOT 0.37 08/14/2017 0837       RADIOGRAPHIC STUDIES: Ct Chest W Contrast  Result Date: 03/22/2019 CLINICAL DATA:  Lung cancer diagnosed 5 years ago post chemotherapy and radiation therapy. History of cervical cancer. EXAM: CT CHEST, ABDOMEN, AND PELVIS WITH CONTRAST TECHNIQUE: Multidetector CT imaging of the  chest, abdomen and pelvis was performed following the standard protocol during bolus administration of intravenous contrast. CONTRAST:  114m ISOVUE-300 IOPAMIDOL (ISOVUE-300) INJECTION 61% COMPARISON:  CT 12/28/2018 and 10/05/2018. FINDINGS: CT CHEST FINDINGS Cardiovascular: Mild atherosclerosis of the aorta, great vessels and coronary arteries. No acute vascular findings identified. The heart size is normal. There is no pericardial effusion. Mediastinum/Nodes: There are no enlarged mediastinal, hilar or axillary lymph nodes. Improved right hilar soft tissue thickening. Stable tiny left thyroid nodule. The trachea and esophagus appear normal. Lungs/Pleura: There is no pleural effusion or pneumothorax. There is stable mild  centrilobular and paraseptal emphysema with radiation changes medially at the right apex. Left apical scarring is stable. No suspicious pulmonary nodule. Musculoskeletal/Chest wall: No chest wall mass or suspicious osseous findings. CT ABDOMEN AND PELVIS FINDINGS Hepatobiliary: The liver is normal in density without suspicious focal abnormality. No evidence of gallstones, gallbladder wall thickening or biliary dilatation. Pancreas: Unremarkable. No pancreatic ductal dilatation or surrounding inflammatory changes. Spleen: Normal in size without focal abnormality. Adrenals/Urinary Tract: Both adrenal glands appear normal. The kidneys appear normal without evidence of urinary tract calculus, suspicious lesion or hydronephrosis. No bladder abnormalities are seen. Stomach/Bowel: No evidence of bowel wall thickening, distention or surrounding inflammatory change. The appendix appears normal. Rectosigmoid anastomosis is intact. Minimal diverticulosis of the sigmoid colon. Vascular/Lymphatic: There are no enlarged abdominal or pelvic lymph nodes. Diffuse aortic and branch vessel atherosclerosis. No acute vascular findings. Reproductive: Hysterectomy. Stable appearance the adnexa. Probable pelvic floor laxity. Other: Stable postsurgical changes in the anterior abdominal wall with periumbilical hernias Musculoskeletal: No acute or significant osseous findings. IMPRESSION: 1. Stable CTs of the chest, abdomen and pelvis. No evidence of metastatic disease. 2. Stable right apical radiation changes. 3. Aortic Atherosclerosis (ICD10-I70.0) and Emphysema (ICD10-J43.9). Electronically Signed   By: WRichardean SaleM.D.   On: 03/22/2019 14:20   Ct Abdomen Pelvis W Contrast  Result Date: 03/22/2019 CLINICAL DATA:  Lung cancer diagnosed 5 years ago post chemotherapy and radiation therapy. History of cervical cancer. EXAM: CT CHEST, ABDOMEN, AND PELVIS WITH CONTRAST TECHNIQUE: Multidetector CT imaging of the chest, abdomen and pelvis  was performed following the standard protocol during bolus administration of intravenous contrast. CONTRAST:  1062mISOVUE-300 IOPAMIDOL (ISOVUE-300) INJECTION 61% COMPARISON:  CT 12/28/2018 and 10/05/2018. FINDINGS: CT CHEST FINDINGS Cardiovascular: Mild atherosclerosis of the aorta, great vessels and coronary arteries. No acute vascular findings identified. The heart size is normal. There is no pericardial effusion. Mediastinum/Nodes: There are no enlarged mediastinal, hilar or axillary lymph nodes. Improved right hilar soft tissue thickening. Stable tiny left thyroid nodule. The trachea and esophagus appear normal. Lungs/Pleura: There is no pleural effusion or pneumothorax. There is stable mild centrilobular and paraseptal emphysema with radiation changes medially at the right apex. Left apical scarring is stable. No suspicious pulmonary nodule. Musculoskeletal/Chest wall: No chest wall mass or suspicious osseous findings. CT ABDOMEN AND PELVIS FINDINGS Hepatobiliary: The liver is normal in density without suspicious focal abnormality. No evidence of gallstones, gallbladder wall thickening or biliary dilatation. Pancreas: Unremarkable. No pancreatic ductal dilatation or surrounding inflammatory changes. Spleen: Normal in size without focal abnormality. Adrenals/Urinary Tract: Both adrenal glands appear normal. The kidneys appear normal without evidence of urinary tract calculus, suspicious lesion or hydronephrosis. No bladder abnormalities are seen. Stomach/Bowel: No evidence of bowel wall thickening, distention or surrounding inflammatory change. The appendix appears normal. Rectosigmoid anastomosis is intact. Minimal diverticulosis of the sigmoid colon. Vascular/Lymphatic: There are no enlarged abdominal or pelvic lymph nodes. Diffuse aortic  and branch vessel atherosclerosis. No acute vascular findings. Reproductive: Hysterectomy. Stable appearance the adnexa. Probable pelvic floor laxity. Other: Stable  postsurgical changes in the anterior abdominal wall with periumbilical hernias Musculoskeletal: No acute or significant osseous findings. IMPRESSION: 1. Stable CTs of the chest, abdomen and pelvis. No evidence of metastatic disease. 2. Stable right apical radiation changes. 3. Aortic Atherosclerosis (ICD10-I70.0) and Emphysema (ICD10-J43.9). Electronically Signed   By: Richardean Sale M.D.   On: 03/22/2019 14:20    ASSESSMENT AND PLAN:  This is a very pleasant 68 years old white female with metastatic non-small cell lung cancer, adenocarcinoma status post induction systemic chemotherapy with carboplatin and Alimta with partial response. The patient is currently on maintenance treatment with single agent Alimta status post 83 cycles. The patient has been tolerating this treatment well with no concerning adverse effects. She had repeat CT scan of the chest, abdomen pelvis performed recently.  I personally and independently reviewed the scans and discussed the results with the patient today. Her scan showed no concerning findings for disease progression. I recommended for her to continue her current treatment with maintenance Alimta and she will proceed with cycle #84 today. For anxiety the patient will continue her current treatment with Xanax on a as needed basis. She will come back for follow-up visit in 3 weeks for evaluation before the next cycle of her treatment. She was advised to call immediately if she has any concerning symptoms in the interval. The patient voices understanding of current disease status and treatment options and is in agreement with the current care plan. All questions were answered. The patient knows to call the clinic with any problems, questions or concerns. We can certainly see the patient much sooner if necessary.  Disclaimer: This note was dictated with voice recognition software. Similar sounding words can inadvertently be transcribed and may not be corrected upon  review.

## 2019-03-25 NOTE — Telephone Encounter (Signed)
Scheduled appt per 7/27 los.  Printed calendar and avs.

## 2019-03-25 NOTE — Patient Instructions (Signed)
[facility] Discharge Instructions for Patients Receiving Chemotherapy  Today you received the following chemotherapy agents: Pemetrexed (Alimta)  To help prevent nausea and vomiting after your treatment, we encourage you to take your nausea medication as directed.   If you develop nausea and vomiting that is not controlled by your nausea medication, call the clinic.   BELOW ARE SYMPTOMS THAT SHOULD BE REPORTED IMMEDIATELY:  *FEVER GREATER THAN 100.5 F  *CHILLS WITH OR WITHOUT FEVER  NAUSEA AND VOMITING THAT IS NOT CONTROLLED WITH YOUR NAUSEA MEDICATION  *UNUSUAL SHORTNESS OF BREATH  *UNUSUAL BRUISING OR BLEEDING  TENDERNESS IN MOUTH AND THROAT WITH OR WITHOUT PRESENCE OF ULCERS  *URINARY PROBLEMS  *BOWEL PROBLEMS  UNUSUAL RASH Items with * indicate a potential emergency and should be followed up as soon as possible.  Feel free to call the clinic should you have any questions or concerns. The clinic phone number is [phone].  Please show the [facility] at check-in to the Emergency Department and triage nurse.  Coronavirus (COVID-19) Are you at risk?  Are you at risk for the Coronavirus (COVID-19)?  To be considered HIGH RISK for Coronavirus (COVID-19), you have to meet the following criteria:  . Traveled to [place], [place], [place], [place] or [place]; or in the [place] to [place], [place], [place], or [place]; and have fever, cough, and shortness of breath within the last 2 weeks of travel OR . Been in close contact with a person diagnosed with COVID-19 within the last 2 weeks and have fever, cough, and shortness of breath . IF YOU DO NOT MEET THESE CRITERIA, YOU ARE CONSIDERED LOW RISK FOR COVID-19.  What to do if you are HIGH RISK for COVID-19?  Marland Kitchen If you are having a medical emergency, call 911. . Seek medical care right away. Before you go to a doctor's office, urgent care or emergency department, call ahead and tell them about  your recent travel, contact with someone diagnosed with COVID-19, and your symptoms. You should receive instructions from your physician's office regarding next steps of care.  . When you arrive at healthcare provider, tell the healthcare staff immediately you have returned from visiting [place], [place], [place], [place] or [place]; or traveled in the [place] to [place], [place], [place], or [place]; in the last two weeks or you have been in close contact with a person diagnosed with COVID-19 in the last 2 weeks.   . Tell the health care staff about your symptoms: fever, cough and shortness of breath. . After you have been seen by a medical provider, you will be either: o Tested for (COVID-19) and discharged home on quarantine except to seek medical care if symptoms worsen, and asked to  - Stay home and avoid contact with others until you get your results (4-5 days)  - Avoid travel on public transportation if possible (such as bus, train, or airplane) or o Sent to the Emergency Department by EMS for evaluation, COVID-19 testing, and possible admission depending on your condition and test results.  What to do if you are LOW RISK for COVID-19?  Reduce your risk of any infection by using the same precautions used for avoiding the common cold or flu:  Marland Kitchen Wash your hands often with soap and warm water for at least 20 seconds.  If soap and water are not readily available, use an alcohol-based hand sanitizer with at least 60% alcohol.  . If coughing or  sneezing, cover your mouth and nose by coughing or sneezing into the elbow areas of your shirt or coat, into a tissue or into your sleeve (not your hands). . Avoid shaking hands with others and consider head nods or verbal greetings only. . Avoid touching your eyes, nose, or mouth with unwashed hands.  . Avoid close contact with people who are sick. . Avoid places or events with large numbers of people in one location, like concerts or sporting  events. . Carefully consider travel plans you have or are making. . If you are planning any travel outside or inside the [place], visit the CDC's Travelers' Health webpage for the latest health notices. . If you have some symptoms but not all symptoms, continue to monitor at home and seek medical attention if your symptoms worsen. . If you are having a medical emergency, call 911.   [place] / e-Visit: [email or web address]         [facility]: [facility]: [phone]                   [facility]: [phone]

## 2019-04-04 DIAGNOSIS — N3091 Cystitis, unspecified with hematuria: Secondary | ICD-10-CM | POA: Diagnosis not present

## 2019-04-07 DIAGNOSIS — B029 Zoster without complications: Secondary | ICD-10-CM | POA: Diagnosis not present

## 2019-04-07 DIAGNOSIS — R21 Rash and other nonspecific skin eruption: Secondary | ICD-10-CM | POA: Diagnosis not present

## 2019-04-15 ENCOUNTER — Inpatient Hospital Stay: Payer: Medicare Other | Attending: Internal Medicine | Admitting: Internal Medicine

## 2019-04-15 ENCOUNTER — Telehealth: Payer: Self-pay | Admitting: Internal Medicine

## 2019-04-15 ENCOUNTER — Other Ambulatory Visit: Payer: Self-pay

## 2019-04-15 ENCOUNTER — Inpatient Hospital Stay: Payer: Medicare Other

## 2019-04-15 ENCOUNTER — Encounter: Payer: Self-pay | Admitting: Internal Medicine

## 2019-04-15 VITALS — BP 123/57 | HR 89 | Temp 98.7°F | Resp 18 | Ht 64.0 in | Wt 183.3 lb

## 2019-04-15 DIAGNOSIS — C7931 Secondary malignant neoplasm of brain: Secondary | ICD-10-CM | POA: Insufficient documentation

## 2019-04-15 DIAGNOSIS — Z5111 Encounter for antineoplastic chemotherapy: Secondary | ICD-10-CM | POA: Insufficient documentation

## 2019-04-15 DIAGNOSIS — F419 Anxiety disorder, unspecified: Secondary | ICD-10-CM | POA: Diagnosis not present

## 2019-04-15 DIAGNOSIS — C3411 Malignant neoplasm of upper lobe, right bronchus or lung: Secondary | ICD-10-CM | POA: Diagnosis not present

## 2019-04-15 DIAGNOSIS — Z79899 Other long term (current) drug therapy: Secondary | ICD-10-CM | POA: Diagnosis not present

## 2019-04-15 DIAGNOSIS — Z923 Personal history of irradiation: Secondary | ICD-10-CM | POA: Insufficient documentation

## 2019-04-15 LAB — CMP (CANCER CENTER ONLY)
ALT: 13 U/L (ref 0–44)
AST: 12 U/L — ABNORMAL LOW (ref 15–41)
Albumin: 3.4 g/dL — ABNORMAL LOW (ref 3.5–5.0)
Alkaline Phosphatase: 84 U/L (ref 38–126)
Anion gap: 10 (ref 5–15)
BUN: 11 mg/dL (ref 8–23)
CO2: 22 mmol/L (ref 22–32)
Calcium: 9.1 mg/dL (ref 8.9–10.3)
Chloride: 107 mmol/L (ref 98–111)
Creatinine: 0.78 mg/dL (ref 0.44–1.00)
GFR, Est AFR Am: >60 mL/min (ref >60)
GFR, Estimated: >60 mL/min (ref >60)
Glucose, Bld: 174 mg/dL — ABNORMAL HIGH (ref 70–99)
Potassium: 3.8 mmol/L (ref 3.5–5.1)
Sodium: 139 mmol/L (ref 135–145)
Total Bilirubin: 0.3 mg/dL (ref 0.3–1.2)
Total Protein: 6.7 g/dL (ref 6.5–8.1)

## 2019-04-15 LAB — CBC WITH DIFFERENTIAL (CANCER CENTER ONLY)
Abs Immature Granulocytes: 0.03 10*3/uL (ref 0.00–0.07)
Basophils Absolute: 0 10*3/uL (ref 0.0–0.1)
Basophils Relative: 0 %
Eosinophils Absolute: 0 10*3/uL (ref 0.0–0.5)
Eosinophils Relative: 0 %
HCT: 38.1 % (ref 36.0–46.0)
Hemoglobin: 12.2 g/dL (ref 12.0–15.0)
Immature Granulocytes: 0 %
Lymphocytes Relative: 4 %
Lymphs Abs: 0.4 10*3/uL — ABNORMAL LOW (ref 0.7–4.0)
MCH: 32.4 pg (ref 26.0–34.0)
MCHC: 32 g/dL (ref 30.0–36.0)
MCV: 101.1 fL — ABNORMAL HIGH (ref 80.0–100.0)
Monocytes Absolute: 0.5 10*3/uL (ref 0.1–1.0)
Monocytes Relative: 5 %
Neutro Abs: 8.7 10*3/uL — ABNORMAL HIGH (ref 1.7–7.7)
Neutrophils Relative %: 91 %
Platelet Count: 293 10*3/uL (ref 150–400)
RBC: 3.77 MIL/uL — ABNORMAL LOW (ref 3.87–5.11)
RDW: 15.2 % (ref 11.5–15.5)
WBC Count: 9.6 10*3/uL (ref 4.0–10.5)
nRBC: 0 % (ref 0.0–0.2)

## 2019-04-15 MED ORDER — SODIUM CHLORIDE 0.9 % IV SOLN
500.0000 mg/m2 | Freq: Once | INTRAVENOUS | Status: AC
  Administered 2019-04-15: 900 mg via INTRAVENOUS
  Filled 2019-04-15: qty 20

## 2019-04-15 MED ORDER — DEXAMETHASONE SODIUM PHOSPHATE 10 MG/ML IJ SOLN
10.0000 mg | Freq: Once | INTRAMUSCULAR | Status: AC
  Administered 2019-04-15: 10:00:00 10 mg via INTRAVENOUS

## 2019-04-15 MED ORDER — ONDANSETRON HCL 8 MG PO TABS
8.0000 mg | ORAL_TABLET | Freq: Once | ORAL | Status: AC
  Administered 2019-04-15: 8 mg via ORAL

## 2019-04-15 MED ORDER — SODIUM CHLORIDE 0.9 % IV SOLN
Freq: Once | INTRAVENOUS | Status: AC
  Administered 2019-04-15: 10:00:00 via INTRAVENOUS
  Filled 2019-04-15: qty 250

## 2019-04-15 MED ORDER — DEXAMETHASONE SODIUM PHOSPHATE 10 MG/ML IJ SOLN
INTRAMUSCULAR | Status: AC
  Filled 2019-04-15: qty 1

## 2019-04-15 NOTE — Telephone Encounter (Signed)
Scheduled per 08/17 los, patient received avs and calender.

## 2019-04-15 NOTE — Progress Notes (Signed)
[facility] [phone]   [phone]  OFFICE PROGRESS NOTE  [name], MD [address] [place] [address]  DIAGNOSIS: Stage IV (T2a, N0, M1b) non-small cell lung cancer, adenocarcinoma with negative EGFR and ALK mutations diagnosed in January 2015 and presented with right upper lobe lung mass in addition to a solitary brain metastasis.  PRIOR THERAPY: 1) Status post stereotactic radiotherapy to a solitary right parietal brain lesion under the care of Dr. [name] on 10/16/2013. 2) Status post palliative radiotherapy to the right lung tumor under the care of Dr. [name] completed on 12/05/2013. 3) Systemic chemotherapy with carboplatin for AUC of 5 and Alimta 500 mg/M2 every 3 weeks. First dose Jan 06 2014. Status post 6 cycles.  CURRENT THERAPY: Systemic chemotherapy with maintenance Alimta 500 MG/M2 every 3 weeks, status post 84 cycles.  INTERVAL HISTORY: Alexis [name] 68 y.o. female returns to the clinic today for follow-up visit.  The patient is feeling fine today with no concerning complaints except for development of shingles on the left side of her chest in the midaxillary line.  She is currently undergoing treatment with Valtrex.  She denied having any chest pain, shortness of breath, cough or hemoptysis.  She denied having any fever or chills.  The patient denied having any recent weight loss or night sweats.  She has no nausea, vomiting, diarrhea or constipation.  She denied having any headache or visual changes.  She is here today for evaluation before starting cycle #85.  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  MEDICATIONS:  Current Outpatient  Medications  Medication Sig Dispense Refill   acetaminophen (TYLENOL) 500 MG tablet Take 500 mg by mouth every 6 (six) hours as needed for mild pain or headache. Reported on 11/02/2015     ALPRAZolam (XANAX) 0.25 MG tablet Take 1 tablet (0.25 mg total) by mouth 3 (three) times daily as needed. 30 tablet 0   Ascorbic Acid (VITAMIN C GUMMIE PO) Take 1 each by mouth every morning.     dexamethasone (DECADRON) 4 MG tablet TAKE 1 TABLET BY MOUTH TWICE A DAY THE DAY BEFORE, DAY OF AND DAY AFTER THE CHEMOTHERAPY EVERY 3 WKS (Patient not taking: Reported on 03/04/2019) 40 tablet 1   folic acid (FOLVITE) 1 MG tablet TAKE 1 TABLET BY MOUTH EVERY DAY 90 tablet 0   Multiple Vitamin (MULTIVITAMIN WITH MINERALS) TABS tablet Take 1 tablet by mouth every morning.     omeprazole (PRILOSEC) 20 MG capsule Take 1 capsule (20 mg total) by mouth daily. As needed for reflux or indigestion. 42 capsule PRN   ondansetron (ZOFRAN) 8 MG tablet Take one tab po before chemo. 30 tablet 0   OVER THE COUNTER MEDICATION Take 1 tablet by mouth every morning. (Vitamin A)     phenazopyridine (PYRIDIUM) 200 MG tablet Take 1 tablet (200 mg total) by mouth 3 (three) times daily as needed for pain. (Patient not taking: Reported on 03/04/2019) 15 tablet 0   prochlorperazine (COMPAZINE) 10 MG tablet Take 1 tablet (10 mg total) by mouth every 6 (six) hours as needed for nausea or vomiting. (Patient not taking:  Reported on 03/04/2019) 60 tablet 0   senna-docusate (SENOKOT-S) 8.6-50 MG tablet Take 1 tablet by mouth daily. (Patient not taking: Reported on 03/04/2019) 30 tablet prn   No current facility-administered medications for this visit.     SURGICAL HISTORY:  Past Surgical History:  Procedure Laterality Date   ABDOMINAL HYSTERECTOMY     COLOSTOMY TAKEDOWN N/A 07/10/2014   Procedure: LAPAROSCOPIC LYSIS OF ADHESIONS (90 MIN) LAPAROSCOPIC ASSISTED COLOSTOMY CLOSURE, RIGID PROCTOSIGMOIDOSCOPY;  Surgeon: Jackolyn Confer, MD;   Location: [facility];  Service: General;  Laterality: N/A;   LAPAROTOMY N/A 11/03/2013   Procedure: EXPLORATORY LAPAROTOMY, DRAINAGE OF INTRA  ABDOMINAL ABSCESSES, MOBILIZATION OF SPLENIC FLEXURE, SIGMOID COLECTOMY WITH COLOSTOMY;  Surgeon: Odis Hollingshead, MD;  Location: [facility];  Service: General;  Laterality: N/A;   VIDEO BRONCHOSCOPY Bilateral 08/30/2013   Procedure: VIDEO BRONCHOSCOPY WITH FLUORO;  Surgeon: Tanda Rockers, MD;  Location: [facility];  Service: Cardiopulmonary;  Laterality: Bilateral;    REVIEW OF SYSTEMS:  A comprehensive review of systems was negative except for: Constitutional: positive for fatigue Integument/breast: positive for rash   PHYSICAL EXAMINATION: General appearance: alert, cooperative and no distress Head: Normocephalic, without obvious abnormality, atraumatic Neck: no adenopathy, no JVD, supple, symmetrical, trachea midline and thyroid not enlarged, symmetric, no tenderness/mass/nodules Lymph nodes: Cervical, supraclavicular, and axillary nodes normal. Resp: clear to auscultation bilaterally Back: symmetric, no curvature. ROM normal. No CVA tenderness. Cardio: regular rate and rhythm, S1, S2 normal, no murmur, click, rub or gallop GI: soft, non-tender; bowel sounds normal; no masses,  no organomegaly Extremities: extremities normal, atraumatic, no cyanosis or edema  Skin exam showed crusted pustules on the left side of the chest and back.  ECOG PERFORMANCE STATUS: 1 - Symptomatic but completely ambulatory  Blood pressure (!) 123/57, pulse 89, temperature 98.7 F (37.1 C), temperature source Oral, resp. rate 18, height _0  (1.626 m), weight 183 lb 4.8 oz (83.1 kg), SpO2 100 %.  LABORATORY DATA: Lab Results  Component Value Date   WBC 9.6 04/15/2019   HGB 12.2 04/15/2019   HCT 38.1 04/15/2019   MCV 101.1 (H) 04/15/2019   PLT 293 04/15/2019      Chemistry      Component Value Date/Time   NA 138 03/25/2019 0908   NA 139 08/14/2017 0837   K 4.5  03/25/2019 0908   K 4.4 08/14/2017 0837   CL 107 03/25/2019 0908   CO2 22 03/25/2019 0908   CO2 24 08/14/2017 0837   BUN 18 03/25/2019 0908   BUN 13.3 08/14/2017 0837   CREATININE 0.90 03/25/2019 0908   CREATININE 0.75 12/10/2018 0858   CREATININE 0.8 08/14/2017 0837      Component Value Date/Time   CALCIUM 9.9 03/25/2019 0908   CALCIUM 9.3 08/14/2017 0837   ALKPHOS 108 03/25/2019 0908   ALKPHOS 107 08/14/2017 0837   AST 11 (L) 03/25/2019 0908   AST 12 (L) 12/10/2018 0858   AST 12 08/14/2017 0837   ALT 15 03/25/2019 0908   ALT 12 12/10/2018 0858   ALT 12 08/14/2017 0837   BILITOT 0.4 03/25/2019 0908   BILITOT 0.4 12/10/2018 0858   BILITOT 0.37 08/14/2017 0837       RADIOGRAPHIC STUDIES: Ct Chest W Contrast  Result Date: 03/22/2019 CLINICAL DATA:  Lung cancer diagnosed 5 years ago post chemotherapy and radiation therapy. History of cervical cancer. EXAM: CT CHEST, ABDOMEN, AND PELVIS WITH CONTRAST TECHNIQUE: Multidetector CT imaging of the chest, abdomen and pelvis was performed following the standard  protocol during bolus administration of intravenous contrast. CONTRAST:  167m ISOVUE-300 IOPAMIDOL (ISOVUE-300) INJECTION 61% COMPARISON:  CT 12/28/2018 and 10/05/2018. FINDINGS: CT CHEST FINDINGS Cardiovascular: Mild atherosclerosis of the aorta, great vessels and coronary arteries. No acute vascular findings identified. The heart size is normal. There is no pericardial effusion. Mediastinum/Nodes: There are no enlarged mediastinal, hilar or axillary lymph nodes. Improved right hilar soft tissue thickening. Stable tiny left thyroid nodule. The trachea and esophagus appear normal. Lungs/Pleura: There is no pleural effusion or pneumothorax. There is stable mild centrilobular and paraseptal emphysema with radiation changes medially at the right apex. Left apical scarring is stable. No suspicious pulmonary nodule. Musculoskeletal/Chest wall: No chest wall mass or suspicious osseous  findings. CT ABDOMEN AND PELVIS FINDINGS Hepatobiliary: The liver is normal in density without suspicious focal abnormality. No evidence of gallstones, gallbladder wall thickening or biliary dilatation. Pancreas: Unremarkable. No pancreatic ductal dilatation or surrounding inflammatory changes. Spleen: Normal in size without focal abnormality. Adrenals/Urinary Tract: Both adrenal glands appear normal. The kidneys appear normal without evidence of urinary tract calculus, suspicious lesion or hydronephrosis. No bladder abnormalities are seen. Stomach/Bowel: No evidence of bowel wall thickening, distention or surrounding inflammatory change. The appendix appears normal. Rectosigmoid anastomosis is intact. Minimal diverticulosis of the sigmoid colon. Vascular/Lymphatic: There are no enlarged abdominal or pelvic lymph nodes. Diffuse aortic and branch vessel atherosclerosis. No acute vascular findings. Reproductive: Hysterectomy. Stable appearance the adnexa. Probable pelvic floor laxity. Other: Stable postsurgical changes in the anterior abdominal wall with periumbilical hernias Musculoskeletal: No acute or significant osseous findings. IMPRESSION: 1. Stable CTs of the chest, abdomen and pelvis. No evidence of metastatic disease. 2. Stable right apical radiation changes. 3. Aortic Atherosclerosis (ICD10-I70.0) and Emphysema (ICD10-J43.9). Electronically Signed   By: WRichardean SaleM.D.   On: 03/22/2019 14:20   Ct Abdomen Pelvis W Contrast  Result Date: 03/22/2019 CLINICAL DATA:  Lung cancer diagnosed 5 years ago post chemotherapy and radiation therapy. History of cervical cancer. EXAM: CT CHEST, ABDOMEN, AND PELVIS WITH CONTRAST TECHNIQUE: Multidetector CT imaging of the chest, abdomen and pelvis was performed following the standard protocol during bolus administration of intravenous contrast. CONTRAST:  1029mISOVUE-300 IOPAMIDOL (ISOVUE-300) INJECTION 61% COMPARISON:  CT 12/28/2018 and 10/05/2018. FINDINGS: CT  CHEST FINDINGS Cardiovascular: Mild atherosclerosis of the aorta, great vessels and coronary arteries. No acute vascular findings identified. The heart size is normal. There is no pericardial effusion. Mediastinum/Nodes: There are no enlarged mediastinal, hilar or axillary lymph nodes. Improved right hilar soft tissue thickening. Stable tiny left thyroid nodule. The trachea and esophagus appear normal. Lungs/Pleura: There is no pleural effusion or pneumothorax. There is stable mild centrilobular and paraseptal emphysema with radiation changes medially at the right apex. Left apical scarring is stable. No suspicious pulmonary nodule. Musculoskeletal/Chest wall: No chest wall mass or suspicious osseous findings. CT ABDOMEN AND PELVIS FINDINGS Hepatobiliary: The liver is normal in density without suspicious focal abnormality. No evidence of gallstones, gallbladder wall thickening or biliary dilatation. Pancreas: Unremarkable. No pancreatic ductal dilatation or surrounding inflammatory changes. Spleen: Normal in size without focal abnormality. Adrenals/Urinary Tract: Both adrenal glands appear normal. The kidneys appear normal without evidence of urinary tract calculus, suspicious lesion or hydronephrosis. No bladder abnormalities are seen. Stomach/Bowel: No evidence of bowel wall thickening, distention or surrounding inflammatory change. The appendix appears normal. Rectosigmoid anastomosis is intact. Minimal diverticulosis of the sigmoid colon. Vascular/Lymphatic: There are no enlarged abdominal or pelvic lymph nodes. Diffuse aortic and branch vessel atherosclerosis. No acute vascular findings. Reproductive:  Hysterectomy. Stable appearance the adnexa. Probable pelvic floor laxity. Other: Stable postsurgical changes in the anterior abdominal wall with periumbilical hernias Musculoskeletal: No acute or significant osseous findings. IMPRESSION: 1. Stable CTs of the chest, abdomen and pelvis. No evidence of metastatic  disease. 2. Stable right apical radiation changes. 3. Aortic Atherosclerosis (ICD10-I70.0) and Emphysema (ICD10-J43.9). Electronically Signed   By: Richardean Sale M.D.   On: 03/22/2019 14:20    ASSESSMENT AND PLAN:  This is a very pleasant 68 years old white female with metastatic non-small cell lung cancer, adenocarcinoma status post induction systemic chemotherapy with carboplatin and Alimta with partial response. The patient is currently on maintenance treatment with single agent Alimta status post 84 cycles. The patient continues to tolerate her treatment well with no concerning adverse effects. I recommended for her to proceed with cycle #85 today as planned. For the shingles she will continue her current treatment with Valtrex. For anxiety the patient will continue her current treatment with Xanax on a as needed basis. I will see her back for follow-up visit in 3 weeks for evaluation before starting cycle #86. She was advised to call immediately if she has any concerning symptoms in the interval. The patient voices understanding of current disease status and treatment options and is in agreement with the current care plan. All questions were answered. The patient knows to call the clinic with any problems, questions or concerns. We can certainly see the patient much sooner if necessary.  Disclaimer: This note was dictated with voice recognition software. Similar sounding words can inadvertently be transcribed and may not be corrected upon review.

## 2019-04-16 ENCOUNTER — Telehealth: Payer: Self-pay | Admitting: *Deleted

## 2019-04-16 ENCOUNTER — Other Ambulatory Visit: Payer: Self-pay | Admitting: Internal Medicine

## 2019-04-16 MED ORDER — ALPRAZOLAM 0.25 MG PO TABS: 0.2500 mg | ORAL_TABLET | Freq: Three times a day (TID) | ORAL | 0 refills | Status: DC | PRN

## 2019-04-16 NOTE — Telephone Encounter (Signed)
Patient's husband called.  Patient needs refill on ativan.  I updated Dr. Julien Nordmann and he will order.  I called the husband back with an update.  He was thankful for the information.

## 2019-05-07 ENCOUNTER — Encounter: Payer: Self-pay | Admitting: Physician Assistant

## 2019-05-07 ENCOUNTER — Other Ambulatory Visit: Payer: Self-pay

## 2019-05-07 ENCOUNTER — Inpatient Hospital Stay: Payer: Medicare Other

## 2019-05-07 ENCOUNTER — Inpatient Hospital Stay (HOSPITAL_BASED_OUTPATIENT_CLINIC_OR_DEPARTMENT_OTHER): Payer: Medicare Other | Admitting: Physician Assistant

## 2019-05-07 ENCOUNTER — Telehealth: Payer: Self-pay | Admitting: Physician Assistant

## 2019-05-07 ENCOUNTER — Inpatient Hospital Stay: Payer: Medicare Other | Attending: Internal Medicine

## 2019-05-07 VITALS — BP 101/50 | HR 84 | Temp 98.5°F | Resp 18 | Wt 183.5 lb

## 2019-05-07 DIAGNOSIS — Z9221 Personal history of antineoplastic chemotherapy: Secondary | ICD-10-CM | POA: Insufficient documentation

## 2019-05-07 DIAGNOSIS — Z5112 Encounter for antineoplastic immunotherapy: Secondary | ICD-10-CM | POA: Diagnosis not present

## 2019-05-07 DIAGNOSIS — Z79899 Other long term (current) drug therapy: Secondary | ICD-10-CM | POA: Insufficient documentation

## 2019-05-07 DIAGNOSIS — Z923 Personal history of irradiation: Secondary | ICD-10-CM | POA: Diagnosis not present

## 2019-05-07 DIAGNOSIS — C7949 Secondary malignant neoplasm of other parts of nervous system: Secondary | ICD-10-CM | POA: Diagnosis not present

## 2019-05-07 DIAGNOSIS — C3411 Malignant neoplasm of upper lobe, right bronchus or lung: Secondary | ICD-10-CM

## 2019-05-07 DIAGNOSIS — Z5111 Encounter for antineoplastic chemotherapy: Secondary | ICD-10-CM | POA: Diagnosis not present

## 2019-05-07 DIAGNOSIS — C7931 Secondary malignant neoplasm of brain: Secondary | ICD-10-CM

## 2019-05-07 LAB — CBC WITH DIFFERENTIAL (CANCER CENTER ONLY)
Abs Immature Granulocytes: 0.05 10*3/uL (ref 0.00–0.07)
Basophils Absolute: 0 10*3/uL (ref 0.0–0.1)
Basophils Relative: 0 %
Eosinophils Absolute: 0 10*3/uL (ref 0.0–0.5)
Eosinophils Relative: 0 %
HCT: 36.3 % (ref 36.0–46.0)
Hemoglobin: 11.8 g/dL — ABNORMAL LOW (ref 12.0–15.0)
Immature Granulocytes: 1 %
Lymphocytes Relative: 5 %
Lymphs Abs: 0.4 10*3/uL — ABNORMAL LOW (ref 0.7–4.0)
MCH: 32.2 pg (ref 26.0–34.0)
MCHC: 32.5 g/dL (ref 30.0–36.0)
MCV: 98.9 fL (ref 80.0–100.0)
Monocytes Absolute: 0.8 10*3/uL (ref 0.1–1.0)
Monocytes Relative: 11 %
Neutro Abs: 6.2 10*3/uL (ref 1.7–7.7)
Neutrophils Relative %: 83 %
Platelet Count: 294 10*3/uL (ref 150–400)
RBC: 3.67 MIL/uL — ABNORMAL LOW (ref 3.87–5.11)
RDW: 15.2 % (ref 11.5–15.5)
WBC Count: 7.4 10*3/uL (ref 4.0–10.5)
nRBC: 0 % (ref 0.0–0.2)

## 2019-05-07 LAB — CMP (CANCER CENTER ONLY)
ALT: 14 U/L (ref 0–44)
AST: 15 U/L (ref 15–41)
Albumin: 3.6 g/dL (ref 3.5–5.0)
Alkaline Phosphatase: 89 U/L (ref 38–126)
Anion gap: 11 (ref 5–15)
BUN: 13 mg/dL (ref 8–23)
CO2: 21 mmol/L — ABNORMAL LOW (ref 22–32)
Calcium: 9.6 mg/dL (ref 8.9–10.3)
Chloride: 108 mmol/L (ref 98–111)
Creatinine: 0.78 mg/dL (ref 0.44–1.00)
GFR, Est AFR Am: >60 mL/min (ref >60)
GFR, Estimated: >60 mL/min (ref >60)
Glucose, Bld: 153 mg/dL — ABNORMAL HIGH (ref 70–99)
Potassium: 3.7 mmol/L (ref 3.5–5.1)
Sodium: 140 mmol/L (ref 135–145)
Total Bilirubin: 0.4 mg/dL (ref 0.3–1.2)
Total Protein: 6.8 g/dL (ref 6.5–8.1)

## 2019-05-07 MED ORDER — CYANOCOBALAMIN 1000 MCG/ML IJ SOLN
1000.0000 ug | Freq: Once | INTRAMUSCULAR | Status: AC
  Administered 2019-05-07: 1000 ug via INTRAMUSCULAR

## 2019-05-07 MED ORDER — CYANOCOBALAMIN 1000 MCG/ML IJ SOLN
INTRAMUSCULAR | Status: AC
  Filled 2019-05-07: qty 1

## 2019-05-07 MED ORDER — ONDANSETRON HCL 8 MG PO TABS: 8.0000 mg | ORAL_TABLET | Freq: Once | ORAL | Status: DC

## 2019-05-07 MED ORDER — DEXAMETHASONE 4 MG PO TABS: ORAL_TABLET | ORAL | 2 refills | Status: DC

## 2019-05-07 MED ORDER — SODIUM CHLORIDE 0.9 % IV SOLN
500.0000 mg/m2 | Freq: Once | INTRAVENOUS | Status: AC
  Administered 2019-05-07: 900 mg via INTRAVENOUS
  Filled 2019-05-07: qty 20

## 2019-05-07 MED ORDER — DEXAMETHASONE SODIUM PHOSPHATE 10 MG/ML IJ SOLN
INTRAMUSCULAR | Status: AC
  Filled 2019-05-07: qty 1

## 2019-05-07 MED ORDER — SODIUM CHLORIDE 0.9 % IV SOLN
Freq: Once | INTRAVENOUS | Status: AC
  Administered 2019-05-07: 10:00:00 via INTRAVENOUS
  Filled 2019-05-07: qty 250

## 2019-05-07 MED ORDER — ONDANSETRON HCL 8 MG PO TABS
ORAL_TABLET | ORAL | Status: AC
  Filled 2019-05-07: qty 1

## 2019-05-07 MED ORDER — DEXAMETHASONE SODIUM PHOSPHATE 10 MG/ML IJ SOLN
10.0000 mg | Freq: Once | INTRAMUSCULAR | Status: AC
  Administered 2019-05-07: 10 mg via INTRAVENOUS

## 2019-05-07 NOTE — Progress Notes (Signed)
[facility] OFFICE PROGRESS NOTE  Alexis Roers, MD [address] [address]  DIAGNOSIS: Stage IV (T2a, N0, M1b) non-small cell lung cancer, adenocarcinoma with negative EGFR and ALK mutations diagnosed in January 2015 and presented with right upper lobe lung mass in addition to a solitary brain metastasis.  PRIOR THERAPY:  1) Status post stereotactic radiotherapy to a solitary right parietal brain lesion under the care of Dr. [name] on 10/16/2013. 2) Status post palliative radiotherapy to the right lung tumor under the care of Dr. [name] completed on 12/05/2013. 3) Systemic chemotherapy with carboplatin for AUC of 5 and Alimta 500 mg/M2 every 3 weeks. First dose Jan 06 2014. Status post 6 cycles.  CURRENT THERAPY: Systemic chemotherapy with maintenance Alimta 500 MG/M2 every 3 weeks, status post 85 cycles.  INTERVAL HISTORY: Alexis [name] 68 y.o. female returns to the clinic for a follow-up visit.  The patient is feeling well today without any concerning complaints. She recently was recently treated for shingles with Valtrex. Her shingles rash is improving at this time.   Regarding her treatment with single agent Alimta, the patient is tolerating her treatment well without any concerning adverse side effects.  She denies any fever, chills, night sweats, or chills.  She denies any nausea, vomiting, diarrhea, or constipation.  She denies any headache or visual changes.  She denies any rashes or skin changes besides the shingles on her left abdomen  She denies chest pain, shortness of breath, cough, or hemoptysis.  She is here today for evaluation before starting cycle #86.  MEDICAL HISTORY: Past Medical History:  Diagnosis Date  . Anxiety   . Anxiety 06/20/2016  . Cervical cancer ([place])   . Encounter for antineoplastic chemotherapy 07/20/2015  . Malignant neoplasm of right upper lobe of lung (H[facility])     non small cell lung cancer adenocarcioma with brain meta     ALLERGIES:  is allergic to codeine.  MEDICATIONS:  Current Outpatient Medications  Medication Sig Dispense Refill  . acetaminophen (TYLENOL) 500 MG tablet Take 500 mg by mouth every 6 (six) hours as needed for mild pain or headache. Reported on 11/02/2015    . ALPRAZolam (XANAX) 0.25 MG tablet Take 1 tablet (0.25 mg total) by mouth 3 (three) times daily as needed. 30 tablet 0  . Ascorbic Acid (VITAMIN C GUMMIE PO) Take 1 each by mouth every morning.    Marland Kitchen dexamethasone (DECADRON) 4 MG tablet Take 1 pill twice a day the day before, the day of, and the day after chemotherapy. 40 tablet 2  . folic acid (FOLVITE) 1 MG tablet TAKE 1 TABLET BY MOUTH EVERY DAY 90 tablet 0  . HYDROcodone-acetaminophen (NORCO/VICODIN) 5-325 MG tablet TAKE 1 TABLET BY MOUTH 3 TIMES A DAY FOR 5 DAYS AS NEEDED FOR PAIN    . Multiple Vitamin (MULTIVITAMIN WITH MINERALS) TABS tablet Take 1 tablet by mouth every morning.    Marland Kitchen omeprazole (PRILOSEC) 20 MG capsule Take 1 capsule (20 mg total) by mouth daily. As needed for reflux or indigestion. 42 capsule PRN  . ondansetron (ZOFRAN) 8 MG tablet Take one tab po before chemo. 30 tablet 0  . OVER THE COUNTER MEDICATION Take 1 tablet by mouth every morning. (Vitamin A)    . prochlorperazine (COMPAZINE) 10 MG tablet Take 1 tablet (10 mg total) by mouth every 6 (six) hours as needed for nausea or vomiting. 60 tablet 0  . senna-docusate (SENOKOT-S) 8.6-50 MG tablet Take 1 tablet  by mouth daily. 30 tablet prn  . valACYclovir (VALTREX) 1000 MG tablet Take 1,000 mg by mouth 3 (three) times daily.    . phenazopyridine (PYRIDIUM) 200 MG tablet Take 1 tablet (200 mg total) by mouth 3 (three) times daily as needed for pain. (Patient not taking: Reported on 03/04/2019) 15 tablet 0   No current facility-administered medications for this visit.     SURGICAL HISTORY:  Past Surgical History:  Procedure Laterality Date  . ABDOMINAL HYSTERECTOMY    . COLOSTOMY TAKEDOWN N/A 07/10/2014    Procedure: LAPAROSCOPIC LYSIS OF ADHESIONS (90 MIN) LAPAROSCOPIC ASSISTED COLOSTOMY CLOSURE, RIGID PROCTOSIGMOIDOSCOPY;  Surgeon: Jackolyn Confer, MD;  Location: [facility];  Service: General;  Laterality: N/A;  . LAPAROTOMY N/A 11/03/2013   Procedure: EXPLORATORY LAPAROTOMY, DRAINAGE OF INTRA  ABDOMINAL ABSCESSES, MOBILIZATION OF SPLENIC FLEXURE, SIGMOID COLECTOMY WITH COLOSTOMY;  Surgeon: Odis Hollingshead, MD;  Location: [facility];  Service: General;  Laterality: N/A;  . VIDEO BRONCHOSCOPY Bilateral 08/30/2013   Procedure: VIDEO BRONCHOSCOPY WITH FLUORO;  Surgeon: Tanda Rockers, MD;  Location: [facility];  Service: Cardiopulmonary;  Laterality: Bilateral;    REVIEW OF SYSTEMS:   Review of Systems  Constitutional: Negative for appetite change, chills, fatigue, fever and unexpected weight change.  HENT:   Negative for mouth sores, nosebleeds, sore throat and trouble swallowing.   Eyes: Negative for eye problems and icterus.  Respiratory: Negative for cough, hemoptysis, shortness of breath and wheezing.   Cardiovascular: Negative for chest pain and leg swelling.  Gastrointestinal: Negative for abdominal pain, constipation, diarrhea, nausea and vomiting.  Genitourinary: Negative for bladder incontinence, difficulty urinating, dysuria, frequency and hematuria.   Musculoskeletal: Negative for back pain, gait problem, neck pain and neck stiffness.  Skin: Positive for improving shingles rash on left abdomen.  Neurological: Negative for dizziness, extremity weakness, gait problem, headaches, light-headedness and seizures.  Hematological: Negative for adenopathy. Does not bruise/bleed easily.  Psychiatric/Behavioral: Negative for confusion, depression and sleep disturbance. The patient is not nervous/anxious.     PHYSICAL EXAMINATION:  Blood pressure (!) 101/50, pulse 84, temperature 98.5 F (36.9 C), temperature source Temporal, resp. rate 18, weight 183 lb 8 oz (83.2 kg), SpO2 99 %.  ECOG PERFORMANCE  STATUS: 1 - Symptomatic but completely ambulatory  Physical Exam  Constitutional: Oriented to person, place, and time and well-developed, well-nourished, and in no distress.   HENT:  Head: Normocephalic and atraumatic.  Mouth/Throat: Oropharynx is clear and moist. No oropharyngeal exudate.  Eyes: Conjunctivae are normal. Right eye exhibits no discharge. Left eye exhibits no discharge. No scleral icterus.  Neck: Normal range of motion. Neck supple.  Cardiovascular: Normal rate, regular rhythm, normal heart sounds and intact distal pulses.   Pulmonary/Chest: Effort normal and breath sounds normal. No respiratory distress. No wheezes. No rales.  Abdominal: Soft. Bowel sounds are normal. Exhibits no distension and no mass. There is no tenderness.  Musculoskeletal: Normal range of motion. Exhibits no edema.  Lymphadenopathy:    No cervical adenopathy.  Neurological: Alert and oriented to person, place, and time. Exhibits normal muscle tone. Gait normal. Coordination normal.  Skin: Positive for dry residual rash on left abdomen. No blistering. Skin is warm and dry. No rash noted. Not diaphoretic. No pallor.  Psychiatric: Mood, memory and judgment normal.  Vitals reviewed.  LABORATORY DATA: Lab Results  Component Value Date   WBC 7.4 05/07/2019   HGB 11.8 (L) 05/07/2019   HCT 36.3 05/07/2019   MCV 98.9 05/07/2019   PLT 294 05/07/2019  Chemistry      Component Value Date/Time   NA 139 04/15/2019 0903   NA 139 08/14/2017 0837   K 3.8 04/15/2019 0903   K 4.4 08/14/2017 0837   CL 107 04/15/2019 0903   CO2 22 04/15/2019 0903   CO2 24 08/14/2017 0837   BUN 11 04/15/2019 0903   BUN 13.3 08/14/2017 0837   CREATININE 0.78 04/15/2019 0903   CREATININE 0.8 08/14/2017 0837      Component Value Date/Time   CALCIUM 9.1 04/15/2019 0903   CALCIUM 9.3 08/14/2017 0837   ALKPHOS 84 04/15/2019 0903   ALKPHOS 107 08/14/2017 0837   AST 12 (L) 04/15/2019 0903   AST 12 08/14/2017 0837   ALT  13 04/15/2019 0903   ALT 12 08/14/2017 0837   BILITOT 0.3 04/15/2019 0903   BILITOT 0.37 08/14/2017 0837       RADIOGRAPHIC STUDIES:  No results found.   ASSESSMENT/PLAN:  This is a very pleasant 68 year old Caucasian female with stage IV non-small cell lung cancer, adenocarcinoma who presented with a right upper lobe lung mass in addition to a solitary brain metastatsis.  She was diagnosed in January 2015. The patient had completed induction systemic chemotherapy with carboplatin and Alimta with a partial response. The patient is currently being treated with single agent maintenance Alimta.  She is status post 86 cycles.   The patient was seen with Dr. Julien Nordmann today.  Labs were reviewed with the patient.  We recommend that she proceed with cycle #86 today as scheduled.  We will see her back for follow-up visit in 3 weeks for evaluation before starting cycle #87.  I have sent a refill of decadron to her pharmacy.   The patient was advised to call immediately if she has any concerning symptoms in the interval. The patient voices understanding of current disease status and treatment options and is in agreement with the current care plan. All questions were answered. The patient knows to call the clinic with any problems, questions or concerns. We can certainly see the patient much sooner if necessary  No orders of the defined types were placed in this encounter.     L , PA-C 05/07/19  ADDENDUM: Hematology/Oncology Attending: I had a face-to-face encounter with the patient today.  I recommended her care plan.  This is a very pleasant 68 years old white female with metastatic non-small cell lung cancer, adenocarcinoma status post induction systemic chemotherapy with carboplatin and Alimta and she is currently on maintenance treatment with single agent Alimta status post 86 cycles.  She has been tolerating this treatment well with no concerning adverse effects. I  recommended for her to proceed with cycle #87 today as planned. We will see her back for follow-up visit in 3 weeks for evaluation before the next cycle of her treatment. She was advised to call immediately if she has any concerning symptoms in the interval.  Disclaimer: This note was dictated with voice recognition software. Similar sounding words can inadvertently be transcribed and may be missed upon review. Eilleen Kempf, MD 05/07/19

## 2019-05-07 NOTE — Telephone Encounter (Signed)
Scheduled appt per 9/8 LOS - gave patient AVS and calender per los.

## 2019-05-07 NOTE — Patient Instructions (Signed)
[facility] Discharge Instructions for Patients Receiving Chemotherapy  Today you received the following chemotherapy agents Pemetrexed (ALIMTA).  To help prevent nausea and vomiting after your treatment, we encourage you to take your nausea medication as prescribed.  If you develop nausea and vomiting that is not controlled by your nausea medication, call the clinic.   BELOW ARE SYMPTOMS THAT SHOULD BE REPORTED IMMEDIATELY:  *FEVER GREATER THAN 100.5 F  *CHILLS WITH OR WITHOUT FEVER  NAUSEA AND VOMITING THAT IS NOT CONTROLLED WITH YOUR NAUSEA MEDICATION  *UNUSUAL SHORTNESS OF BREATH  *UNUSUAL BRUISING OR BLEEDING  TENDERNESS IN MOUTH AND THROAT WITH OR WITHOUT PRESENCE OF ULCERS  *URINARY PROBLEMS  *BOWEL PROBLEMS  UNUSUAL RASH Items with * indicate a potential emergency and should be followed up as soon as possible.  Feel free to call the clinic should you have any questions or concerns. The clinic phone number is [phone].  Please show the [facility] at check-in to the Emergency Department and triage nurse.  Cyanocobalamin, Pyridoxine, and Folate What is this medicine? A multivitamin containing folic acid, vitamin B6, and vitamin B12. This medicine may be used for other purposes; ask your health care provider or pharmacist if you have questions. COMMON BRAND NAME(S): AllanFol RX, AllanTex, Av-Vite FB, B Complex with Folic Acid, ComBgen, FaBB, Folamin, Folastin, [place], [place], [place], [place], [place], [employer], [place] RX 2.2, [place], [place] 2.2, Foltabs 800, Foltx, Homocysteine Formula, Niva-Fol, NuFol, TL [employer], Virt-Gard, Virt-Vite, Virt-Vite [place], Vita-Respa What should I tell my health care provider before I take this medicine? They need to know if you have any of these conditions:  bleeding or clotting disorder  history of anemia of any type  other chronic health condition  an unusual or allergic reaction to vitamins, other  medicines, foods, dyes, or preservatives  pregnant or trying to get pregnant  breast-feeding How should I use this medicine? Take by mouth with a glass of water. May take with food. Follow the directions on the prescription label. It is usually given once a day. Do not take your medicine more often than directed. Contact your pediatrician regarding the use of this medicine in children. Special care may be needed. Overdosage: If you think you have taken too much of this medicine contact a poison control center or emergency room at once. NOTE: This medicine is only for you. Do not share this medicine with others. What if I miss a dose? If you miss a dose, take it as soon as you can. If it is almost time for your next dose, take only that dose. Do not take double or extra doses. What may interact with this medicine?  levodopa This list may not describe all possible interactions. Give your health care provider a list of all the medicines, herbs, non-prescription drugs, or dietary supplements you use. Also tell them if you smoke, drink alcohol, or use illegal drugs. Some items may interact with your medicine. What should I watch for while using this medicine? See your health care professional for regular checks on your progress. Remember that vitamin supplements do not replace the need for good nutrition from a balanced diet. What side effects may I notice from receiving this medicine? Side effects that you should report to your doctor or health care professional as soon as possible:  allergic reaction such as skin rash or difficulty breathing  vomiting Side effects that usually do not require medical attention (report to your doctor or health care  professional if they continue or are bothersome):  nausea  stomach upset This list may not describe all possible side effects. Call your doctor for medical advice about side effects. You may report side effects to FDA at 1-800-FDA-1088. Where  should I keep my medicine? Keep out of the reach of children. Most vitamins should be stored at controlled room temperature. Check your specific product directions. Protect from heat and moisture. Throw away any unused medicine after the expiration date. NOTE: This sheet is a summary. It may not cover all possible information. If you have questions about this medicine, talk to your doctor, pharmacist, or health care provider.  2020 Elsevier/Gold Standard (2007-10-06 00:59:55)  Coronavirus (COVID-19) Are you at risk?  Are you at risk for the Coronavirus (COVID-19)?  To be considered HIGH RISK for Coronavirus (COVID-19), you have to meet the following criteria:  . Traveled to [place], [place], [place], [place] or [place]; or in the [place] to [place], [place], [place], or [place]; and have fever, cough, and shortness of breath within the last 2 weeks of travel OR . Been in close contact with a person diagnosed with COVID-19 within the last 2 weeks and have fever, cough, and shortness of breath . IF YOU DO NOT MEET THESE CRITERIA, YOU ARE CONSIDERED LOW RISK FOR COVID-19.  What to do if you are HIGH RISK for COVID-19?  Marland Kitchen If you are having a medical emergency, call 911. . Seek medical care right away. Before you go to a doctor's office, urgent care or emergency department, call ahead and tell them about your recent travel, contact with someone diagnosed with COVID-19, and your symptoms. You should receive instructions from your physician's office regarding next steps of care.  . When you arrive at healthcare provider, tell the healthcare staff immediately you have returned from visiting [place], [place], [place], [place] or [place]; or traveled in the [place] to [place], [place], [place], or [place]; in the last two weeks or you have been in close contact with a person diagnosed with COVID-19 in the last 2 weeks.   . Tell the health care staff about your symptoms: fever,  cough and shortness of breath. . After you have been seen by a medical provider, you will be either: o Tested for (COVID-19) and discharged home on quarantine except to seek medical care if symptoms worsen, and asked to  - Stay home and avoid contact with others until you get your results (4-5 days)  - Avoid travel on public transportation if possible (such as bus, train, or airplane) or o Sent to the Emergency Department by EMS for evaluation, COVID-19 testing, and possible admission depending on your condition and test results.  What to do if you are LOW RISK for COVID-19?  Reduce your risk of any infection by using the same precautions used for avoiding the common cold or flu:  Marland Kitchen Wash your hands often with soap and warm water for at least 20 seconds.  If soap and water are not readily available, use an alcohol-based hand sanitizer with at least 60% alcohol.  . If coughing or sneezing, cover your mouth and nose by coughing or sneezing into the elbow areas of your shirt or coat, into a tissue or into your sleeve (not your hands). . Avoid shaking hands with others and consider head nods or verbal greetings only. . Avoid touching your eyes, nose, or mouth with unwashed hands.  . Avoid close contact with people  who are sick. . Avoid places or events with large numbers of people in one location, like concerts or sporting events. . Carefully consider travel plans you have or are making. . If you are planning any travel outside or inside the [place], visit the CDC's Travelers' Health webpage for the latest health notices. . If you have some symptoms but not all symptoms, continue to monitor at home and seek medical attention if your symptoms worsen. . If you are having a medical emergency, call 911.   [place] / e-Visit: [email or web address]         [facility]: [facility]: [phone]                   [facility]: [phone]

## 2019-05-22 ENCOUNTER — Telehealth: Payer: Self-pay | Admitting: Internal Medicine

## 2019-05-22 NOTE — Telephone Encounter (Signed)
Changed time of 9/28 appointment per 9/23 schedule message. Left message for patient on home and cell.

## 2019-05-26 ENCOUNTER — Other Ambulatory Visit: Payer: Self-pay | Admitting: Internal Medicine

## 2019-05-26 DIAGNOSIS — C3490 Malignant neoplasm of unspecified part of unspecified bronchus or lung: Secondary | ICD-10-CM

## 2019-05-27 ENCOUNTER — Inpatient Hospital Stay (HOSPITAL_BASED_OUTPATIENT_CLINIC_OR_DEPARTMENT_OTHER): Payer: Medicare Other | Admitting: Internal Medicine

## 2019-05-27 ENCOUNTER — Other Ambulatory Visit: Payer: Self-pay | Admitting: Internal Medicine

## 2019-05-27 ENCOUNTER — Other Ambulatory Visit: Payer: Self-pay

## 2019-05-27 ENCOUNTER — Encounter: Payer: Self-pay | Admitting: Internal Medicine

## 2019-05-27 ENCOUNTER — Inpatient Hospital Stay: Payer: Medicare Other

## 2019-05-27 VITALS — BP 118/62 | HR 99 | Temp 98.5°F | Resp 17 | Ht 64.0 in | Wt 181.3 lb

## 2019-05-27 DIAGNOSIS — C3490 Malignant neoplasm of unspecified part of unspecified bronchus or lung: Secondary | ICD-10-CM | POA: Diagnosis not present

## 2019-05-27 DIAGNOSIS — C3411 Malignant neoplasm of upper lobe, right bronchus or lung: Secondary | ICD-10-CM | POA: Diagnosis not present

## 2019-05-27 DIAGNOSIS — Z79899 Other long term (current) drug therapy: Secondary | ICD-10-CM | POA: Diagnosis not present

## 2019-05-27 DIAGNOSIS — Z5111 Encounter for antineoplastic chemotherapy: Secondary | ICD-10-CM

## 2019-05-27 DIAGNOSIS — Z5112 Encounter for antineoplastic immunotherapy: Secondary | ICD-10-CM | POA: Diagnosis not present

## 2019-05-27 DIAGNOSIS — Z9221 Personal history of antineoplastic chemotherapy: Secondary | ICD-10-CM | POA: Diagnosis not present

## 2019-05-27 DIAGNOSIS — F419 Anxiety disorder, unspecified: Secondary | ICD-10-CM

## 2019-05-27 DIAGNOSIS — C7931 Secondary malignant neoplasm of brain: Secondary | ICD-10-CM | POA: Diagnosis not present

## 2019-05-27 DIAGNOSIS — Z923 Personal history of irradiation: Secondary | ICD-10-CM | POA: Diagnosis not present

## 2019-05-27 LAB — CBC WITH DIFFERENTIAL (CANCER CENTER ONLY)
Abs Immature Granulocytes: 0.1 10*3/uL — ABNORMAL HIGH (ref 0.00–0.07)
Basophils Absolute: 0 10*3/uL (ref 0.0–0.1)
Basophils Relative: 0 %
Eosinophils Absolute: 0 10*3/uL (ref 0.0–0.5)
Eosinophils Relative: 0 %
HCT: 36.7 % (ref 36.0–46.0)
Hemoglobin: 11.7 g/dL — ABNORMAL LOW (ref 12.0–15.0)
Immature Granulocytes: 1 %
Lymphocytes Relative: 5 %
Lymphs Abs: 0.5 10*3/uL — ABNORMAL LOW (ref 0.7–4.0)
MCH: 32.2 pg (ref 26.0–34.0)
MCHC: 31.9 g/dL (ref 30.0–36.0)
MCV: 101.1 fL — ABNORMAL HIGH (ref 80.0–100.0)
Monocytes Absolute: 0.4 10*3/uL (ref 0.1–1.0)
Monocytes Relative: 4 %
Neutro Abs: 8.8 10*3/uL — ABNORMAL HIGH (ref 1.7–7.7)
Neutrophils Relative %: 90 %
Platelet Count: 430 10*3/uL — ABNORMAL HIGH (ref 150–400)
RBC: 3.63 MIL/uL — ABNORMAL LOW (ref 3.87–5.11)
RDW: 14.8 % (ref 11.5–15.5)
WBC Count: 9.7 10*3/uL (ref 4.0–10.5)
nRBC: 0 % (ref 0.0–0.2)

## 2019-05-27 LAB — CMP (CANCER CENTER ONLY)
ALT: 12 U/L (ref 0–44)
AST: 11 U/L — ABNORMAL LOW (ref 15–41)
Albumin: 3.4 g/dL — ABNORMAL LOW (ref 3.5–5.0)
Alkaline Phosphatase: 90 U/L (ref 38–126)
Anion gap: 10 (ref 5–15)
BUN: 13 mg/dL (ref 8–23)
CO2: 24 mmol/L (ref 22–32)
Calcium: 9.9 mg/dL (ref 8.9–10.3)
Chloride: 106 mmol/L (ref 98–111)
Creatinine: 0.75 mg/dL (ref 0.44–1.00)
GFR, Est AFR Am: >60 mL/min (ref >60)
GFR, Estimated: >60 mL/min (ref >60)
Glucose, Bld: 167 mg/dL — ABNORMAL HIGH (ref 70–99)
Potassium: 4 mmol/L (ref 3.5–5.1)
Sodium: 140 mmol/L (ref 135–145)
Total Bilirubin: 0.3 mg/dL (ref 0.3–1.2)
Total Protein: 6.8 g/dL (ref 6.5–8.1)

## 2019-05-27 MED ORDER — DEXAMETHASONE SODIUM PHOSPHATE 10 MG/ML IJ SOLN
INTRAMUSCULAR | Status: AC
  Filled 2019-05-27: qty 1

## 2019-05-27 MED ORDER — CYANOCOBALAMIN 1000 MCG/ML IJ SOLN: 1000.0000 ug | Freq: Once | INTRAMUSCULAR | Status: DC

## 2019-05-27 MED ORDER — CYANOCOBALAMIN 1000 MCG/ML IJ SOLN
INTRAMUSCULAR | Status: AC
  Filled 2019-05-27: qty 1

## 2019-05-27 MED ORDER — SODIUM CHLORIDE 0.9% FLUSH
10.0000 mL | Freq: Once | INTRAVENOUS | Status: DC
  Filled 2019-05-27: qty 10

## 2019-05-27 MED ORDER — ALPRAZOLAM 0.25 MG PO TABS: 0.2500 mg | ORAL_TABLET | Freq: Three times a day (TID) | ORAL | 0 refills | Status: DC | PRN

## 2019-05-27 MED ORDER — SODIUM CHLORIDE 0.9 % IV SOLN
500.0000 mg/m2 | Freq: Once | INTRAVENOUS | Status: AC
  Administered 2019-05-27: 10:00:00 900 mg via INTRAVENOUS
  Filled 2019-05-27: qty 16

## 2019-05-27 MED ORDER — SODIUM CHLORIDE 0.9 % IV SOLN
Freq: Once | INTRAVENOUS | Status: AC
  Administered 2019-05-27: 09:00:00 via INTRAVENOUS
  Filled 2019-05-27: qty 250

## 2019-05-27 MED ORDER — SODIUM CHLORIDE 0.9 % IJ SOLN: 10.0000 mL | INTRAMUSCULAR | Status: DC | PRN

## 2019-05-27 MED ORDER — DEXAMETHASONE SODIUM PHOSPHATE 10 MG/ML IJ SOLN
10.0000 mg | Freq: Once | INTRAMUSCULAR | Status: AC
  Administered 2019-05-27: 10 mg via INTRAVENOUS

## 2019-05-27 MED ORDER — ONDANSETRON HCL 8 MG PO TABS: 8.0000 mg | ORAL_TABLET | Freq: Once | ORAL | Status: DC

## 2019-05-27 MED ORDER — HEPARIN SOD (PORK) LOCK FLUSH 100 UNIT/ML IV SOLN
500.0000 [IU] | Freq: Once | INTRAVENOUS | Status: DC | PRN
  Filled 2019-05-27: qty 5

## 2019-05-27 NOTE — Progress Notes (Signed)
[facility] [phone]   [phone]) [phone]  OFFICE PROGRESS NOTE  [name], MD [address] [place] [address]  DIAGNOSIS: Stage IV (T2a, N0, M1b) non-small cell lung cancer, adenocarcinoma with negative EGFR and ALK mutations diagnosed in January 2015 and presented with right upper lobe lung mass in addition to a solitary brain metastasis.  PRIOR THERAPY: 1) Status post stereotactic radiotherapy to a solitary right parietal brain lesion under the care of Dr. [name] on 10/16/2013. 2) Status post palliative radiotherapy to the right lung tumor under the care of Dr. [name] completed on 12/05/2013. 3) Systemic chemotherapy with carboplatin for AUC of 5 and Alimta 500 mg/M2 every 3 weeks. First dose Jan 06 2014. Status post 6 cycles.  CURRENT THERAPY: Systemic chemotherapy with maintenance Alimta 500 MG/M2 every 3 weeks, status post 86 cycles.  INTERVAL HISTORY: Alexis [name] 68 y.o. female returns to the clinic today for follow-up visit.  The patient is feeling fine today with no concerning complaints.  She continues to tolerate her maintenance treatment with Alimta fairly well.  She denied having any fever or chills.  She has no nausea, vomiting, diarrhea or constipation.  She denied having any headache or visual changes.  She has no recent weight loss or night sweats.  She has no chest pain, shortness of breath, cough or hemoptysis.  She is here today for evaluation before starting cycle #87 of her treatment.   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  MEDICATIONS:  Current Outpatient Medications  Medication Sig Dispense Refill   acetaminophen (TYLENOL) 500  MG tablet Take 500 mg by mouth every 6 (six) hours as needed for mild pain or headache. Reported on 11/02/2015     ALPRAZolam (XANAX) 0.25 MG tablet Take 1 tablet (0.25 mg total) by mouth 3 (three) times daily as needed. 30 tablet 0   Ascorbic Acid (VITAMIN C GUMMIE PO) Take 1 each by mouth every morning.     dexamethasone (DECADRON) 4 MG tablet Take 1 pill twice a day the day before, the day of, and the day after chemotherapy. 40 tablet 2   folic acid (FOLVITE) 1 MG tablet TAKE 1 TABLET BY MOUTH EVERY DAY 90 tablet 0   HYDROcodone-acetaminophen (NORCO/VICODIN) 5-325 MG tablet TAKE 1 TABLET BY MOUTH 3 TIMES A DAY FOR 5 DAYS AS NEEDED FOR PAIN     Multiple Vitamin (MULTIVITAMIN WITH MINERALS) TABS tablet Take 1 tablet by mouth every morning.     omeprazole (PRILOSEC) 20 MG capsule Take 1 capsule (20 mg total) by mouth daily. As needed for reflux or indigestion. 42 capsule PRN   ondansetron (ZOFRAN) 8 MG tablet Take one tab po before chemo. 30 tablet 0   OVER THE COUNTER MEDICATION Take 1 tablet by mouth every morning. (Vitamin A)     phenazopyridine (PYRIDIUM) 200 MG tablet Take 1 tablet (200 mg total) by mouth 3 (three) times daily as needed for pain. (Patient not taking: Reported on 03/04/2019) 15 tablet 0   prochlorperazine (COMPAZINE) 10 MG tablet Take 1 tablet (10 mg total) by mouth every 6 (six) hours as needed for nausea or  vomiting. 60 tablet 0   senna-docusate (SENOKOT-S) 8.6-50 MG tablet Take 1 tablet by mouth daily. 30 tablet prn   valACYclovir (VALTREX) 1000 MG tablet Take 1,000 mg by mouth 3 (three) times daily.     No current facility-administered medications for this visit.     SURGICAL HISTORY:  Past Surgical History:  Procedure Laterality Date   ABDOMINAL HYSTERECTOMY     COLOSTOMY TAKEDOWN N/A 07/10/2014   Procedure: LAPAROSCOPIC LYSIS OF ADHESIONS (90 MIN) LAPAROSCOPIC ASSISTED COLOSTOMY CLOSURE, RIGID PROCTOSIGMOIDOSCOPY;  Surgeon: Jackolyn Confer, MD;  Location:  [facility];  Service: General;  Laterality: N/A;   LAPAROTOMY N/A 11/03/2013   Procedure: EXPLORATORY LAPAROTOMY, DRAINAGE OF INTRA  ABDOMINAL ABSCESSES, MOBILIZATION OF SPLENIC FLEXURE, SIGMOID COLECTOMY WITH COLOSTOMY;  Surgeon: Odis Hollingshead, MD;  Location: [facility];  Service: General;  Laterality: N/A;   VIDEO BRONCHOSCOPY Bilateral 08/30/2013   Procedure: VIDEO BRONCHOSCOPY WITH FLUORO;  Surgeon: Tanda Rockers, MD;  Location: [facility];  Service: Cardiopulmonary;  Laterality: Bilateral;    REVIEW OF SYSTEMS:  A comprehensive review of systems was negative.   PHYSICAL EXAMINATION: General appearance: alert, cooperative and no distress Head: Normocephalic, without obvious abnormality, atraumatic Neck: no adenopathy, no JVD, supple, symmetrical, trachea midline and thyroid not enlarged, symmetric, no tenderness/mass/nodules Lymph nodes: Cervical, supraclavicular, and axillary nodes normal. Resp: clear to auscultation bilaterally Back: symmetric, no curvature. ROM normal. No CVA tenderness. Cardio: regular rate and rhythm, S1, S2 normal, no murmur, click, rub or gallop GI: soft, non-tender; bowel sounds normal; no masses,  no organomegaly Extremities: extremities normal, atraumatic, no cyanosis or edema  Skin exam showed crusted pustules on the left side of the chest and back.  ECOG PERFORMANCE STATUS: 1 - Symptomatic but completely ambulatory  Blood pressure 118/62, pulse 99, temperature 98.5 F (36.9 C), temperature source Temporal, resp. rate 17, height '5\' 4"'$  (1.626 m), weight 181 lb 4.8 oz (82.2 kg), SpO2 100 %.  LABORATORY DATA: Lab Results  Component Value Date   WBC 9.7 05/27/2019   HGB 11.7 (L) 05/27/2019   HCT 36.7 05/27/2019   MCV 101.1 (H) 05/27/2019   PLT 430 (H) 05/27/2019      Chemistry      Component Value Date/Time   NA 140 05/07/2019 0852   NA 139 08/14/2017 0837   K 3.7 05/07/2019 0852   K 4.4 08/14/2017 0837   CL 108 05/07/2019 0852   CO2 21 (L)  05/07/2019 0852   CO2 24 08/14/2017 0837   BUN 13 05/07/2019 0852   BUN 13.3 08/14/2017 0837   CREATININE 0.78 05/07/2019 0852   CREATININE 0.8 08/14/2017 0837      Component Value Date/Time   CALCIUM 9.6 05/07/2019 0852   CALCIUM 9.3 08/14/2017 0837   ALKPHOS 89 05/07/2019 0852   ALKPHOS 107 08/14/2017 0837   AST 15 05/07/2019 0852   AST 12 08/14/2017 0837   ALT 14 05/07/2019 0852   ALT 12 08/14/2017 0837   BILITOT 0.4 05/07/2019 0852   BILITOT 0.37 08/14/2017 0837       RADIOGRAPHIC STUDIES: No results found.  ASSESSMENT AND PLAN:  This is a very pleasant 68 years old white female with metastatic non-small cell lung cancer, adenocarcinoma status post induction systemic chemotherapy with carboplatin and Alimta with partial response. The patient is currently on maintenance treatment with single agent Alimta status post 86 cycles. The patient continues to tolerate this treatment well with no concerning adverse effects. I recommended for her to proceed with cycle #  87 today as planned. I will see her back for follow-up visit in 3 weeks for evaluation after repeating CT scan of the chest, abdomen and pelvis for restaging of her disease. For the shingles she completed a course of treatment with Valtrex.  She continues to have some residual dry rash. For anxiety the patient will continue her current treatment with Xanax on a as needed basis.  She will receive a refill today. The patient voices understanding of current disease status and treatment options and is in agreement with the current care plan. All questions were answered. The patient knows to call the clinic with any problems, questions or concerns. We can certainly see the patient much sooner if necessary.  Disclaimer: This note was dictated with voice recognition software. Similar sounding words can inadvertently be transcribed and may not be corrected upon review.

## 2019-05-27 NOTE — Patient Instructions (Signed)
[facility] Discharge Instructions for Patients Receiving Chemotherapy  Today you received the following chemotherapy agents Pemetrexed (ALIMTA).  To help prevent nausea and vomiting after your treatment, we encourage you to take your nausea medication as prescribed.  If you develop nausea and vomiting that is not controlled by your nausea medication, call the clinic.   BELOW ARE SYMPTOMS THAT SHOULD BE REPORTED IMMEDIATELY:  *FEVER GREATER THAN 100.5 F  *CHILLS WITH OR WITHOUT FEVER  NAUSEA AND VOMITING THAT IS NOT CONTROLLED WITH YOUR NAUSEA MEDICATION  *UNUSUAL SHORTNESS OF BREATH  *UNUSUAL BRUISING OR BLEEDING  TENDERNESS IN MOUTH AND THROAT WITH OR WITHOUT PRESENCE OF ULCERS  *URINARY PROBLEMS  *BOWEL PROBLEMS  UNUSUAL RASH Items with * indicate a potential emergency and should be followed up as soon as possible.  Feel free to call the clinic should you have any questions or concerns. The clinic phone number is [phone].  Please show the [facility] at check-in to the Emergency Department and triage nurse.  Cyanocobalamin, Pyridoxine, and Folate What is this medicine? A multivitamin containing folic acid, vitamin B6, and vitamin B12. This medicine may be used for other purposes; ask your health care provider or pharmacist if you have questions. COMMON BRAND NAME(S): AllanFol RX, AllanTex, Av-Vite FB, B Complex with Folic Acid, ComBgen, FaBB, Folamin, Folastin, [place], [place], [place], [place], [place], [employer], [place] RX 2.2, [place], [place] 2.2, Foltabs 800, Foltx, Homocysteine Formula, Niva-Fol, NuFol, [employer], Virt-Gard, Virt-Vite, Virt-Vite [place], Vita-Respa What should I tell my health care provider before I take this medicine? They need to know if you have any of these conditions:  bleeding or clotting disorder  history of anemia of any type  other chronic health condition  an unusual or allergic reaction to vitamins, other  medicines, foods, dyes, or preservatives  pregnant or trying to get pregnant  breast-feeding How should I use this medicine? Take by mouth with a glass of water. May take with food. Follow the directions on the prescription label. It is usually given once a day. Do not take your medicine more often than directed. Contact your pediatrician regarding the use of this medicine in children. Special care may be needed. Overdosage: If you think you have taken too much of this medicine contact a poison control center or emergency room at once. NOTE: This medicine is only for you. Do not share this medicine with others. What if I miss a dose? If you miss a dose, take it as soon as you can. If it is almost time for your next dose, take only that dose. Do not take double or extra doses. What may interact with this medicine?  levodopa This list may not describe all possible interactions. Give your health care provider a list of all the medicines, herbs, non-prescription drugs, or dietary supplements you use. Also tell them if you smoke, drink alcohol, or use illegal drugs. Some items may interact with your medicine. What should I watch for while using this medicine? See your health care professional for regular checks on your progress. Remember that vitamin supplements do not replace the need for good nutrition from a balanced diet. What side effects may I notice from receiving this medicine? Side effects that you should report to your doctor or health care professional as soon as possible:  allergic reaction such as skin rash or difficulty breathing  vomiting Side effects that usually do not require medical attention (report to your doctor or health care  professional if they continue or are bothersome):  nausea  stomach upset This list may not describe all possible side effects. Call your doctor for medical advice about side effects. You may report side effects to FDA at 1-800-FDA-1088. Where  should I keep my medicine? Keep out of the reach of children. Most vitamins should be stored at controlled room temperature. Check your specific product directions. Protect from heat and moisture. Throw away any unused medicine after the expiration date. NOTE: This sheet is a summary. It may not cover all possible information. If you have questions about this medicine, talk to your doctor, pharmacist, or health care provider.  2020 Elsevier/Gold Standard (2007-10-06 00:59:55)  Coronavirus (COVID-19) Are you at risk?  Are you at risk for the Coronavirus (COVID-19)?  To be considered HIGH RISK for Coronavirus (COVID-19), you have to meet the following criteria:  . Traveled to [place], [place], [place], [place] or [place]; or in the [place] to [place], [place], [place], or [place]; and have fever, cough, and shortness of breath within the last 2 weeks of travel OR . Been in close contact with a person diagnosed with COVID-19 within the last 2 weeks and have fever, cough, and shortness of breath . IF YOU DO NOT MEET THESE CRITERIA, YOU ARE CONSIDERED LOW RISK FOR COVID-19.  What to do if you are HIGH RISK for COVID-19?  Marland Kitchen If you are having a medical emergency, call 911. . Seek medical care right away. Before you go to a doctor's office, urgent care or emergency department, call ahead and tell them about your recent travel, contact with someone diagnosed with COVID-19, and your symptoms. You should receive instructions from your physician's office regarding next steps of care.  . When you arrive at healthcare provider, tell the healthcare staff immediately you have returned from visiting [place], [place], [place], [place] or [place]; or traveled in the [place] to [place], [place], [place], or [place]; in the last two weeks or you have been in close contact with a person diagnosed with COVID-19 in the last 2 weeks.   . Tell the health care staff about your symptoms: fever,  cough and shortness of breath. . After you have been seen by a medical provider, you will be either: o Tested for (COVID-19) and discharged home on quarantine except to seek medical care if symptoms worsen, and asked to  - Stay home and avoid contact with others until you get your results (4-5 days)  - Avoid travel on public transportation if possible (such as bus, train, or airplane) or o Sent to the Emergency Department by EMS for evaluation, COVID-19 testing, and possible admission depending on your condition and test results.  What to do if you are LOW RISK for COVID-19?  Reduce your risk of any infection by using the same precautions used for avoiding the common cold or flu:  Marland Kitchen Wash your hands often with soap and warm water for at least 20 seconds.  If soap and water are not readily available, use an alcohol-based hand sanitizer with at least 60% alcohol.  . If coughing or sneezing, cover your mouth and nose by coughing or sneezing into the elbow areas of your shirt or coat, into a tissue or into your sleeve (not your hands). . Avoid shaking hands with others and consider head nods or verbal greetings only. . Avoid touching your eyes, nose, or mouth with unwashed hands.  . Avoid close contact with people  who are sick. . Avoid places or events with large numbers of people in one location, like concerts or sporting events. . Carefully consider travel plans you have or are making. . If you are planning any travel outside or inside the [place], visit the CDC's Travelers' Health webpage for the latest health notices. . If you have some symptoms but not all symptoms, continue to monitor at home and seek medical attention if your symptoms worsen. . If you are having a medical emergency, call 911.   [place] / e-Visit: [email or web address]         [facility]: [facility]: [phone]                   [facility]: [phone]

## 2019-06-13 ENCOUNTER — Ambulatory Visit
Admission: RE | Admit: 2019-06-13 | Discharge: 2019-06-13 | Disposition: A | Discharge status: Home / Self Care | Payer: Medicare Other | Source: Ambulatory Visit | Attending: Internal Medicine | Admitting: Internal Medicine

## 2019-06-13 DIAGNOSIS — C3490 Malignant neoplasm of unspecified part of unspecified bronchus or lung: Secondary | ICD-10-CM

## 2019-06-13 DIAGNOSIS — C3411 Malignant neoplasm of upper lobe, right bronchus or lung: Secondary | ICD-10-CM | POA: Diagnosis not present

## 2019-06-13 DIAGNOSIS — I708 Atherosclerosis of other arteries: Secondary | ICD-10-CM | POA: Diagnosis not present

## 2019-06-13 MED ORDER — IOPAMIDOL (ISOVUE-300) INJECTION 61%
100.0000 mL | Freq: Once | INTRAVENOUS | Status: AC | PRN
  Administered 2019-06-13: 100 mL via INTRAVENOUS

## 2019-06-17 ENCOUNTER — Other Ambulatory Visit: Payer: Self-pay | Admitting: Internal Medicine

## 2019-06-17 ENCOUNTER — Encounter: Payer: Self-pay | Admitting: Internal Medicine

## 2019-06-17 ENCOUNTER — Other Ambulatory Visit: Payer: Self-pay

## 2019-06-17 ENCOUNTER — Inpatient Hospital Stay: Payer: Medicare Other | Attending: Internal Medicine

## 2019-06-17 ENCOUNTER — Telehealth: Payer: Self-pay | Admitting: Internal Medicine

## 2019-06-17 ENCOUNTER — Encounter: Payer: Self-pay | Admitting: *Deleted

## 2019-06-17 ENCOUNTER — Inpatient Hospital Stay: Payer: Medicare Other

## 2019-06-17 ENCOUNTER — Inpatient Hospital Stay (HOSPITAL_BASED_OUTPATIENT_CLINIC_OR_DEPARTMENT_OTHER): Payer: Medicare Other | Admitting: Internal Medicine

## 2019-06-17 VITALS — BP 137/68 | HR 96 | Temp 98.5°F | Resp 18 | Ht 64.0 in | Wt 184.6 lb

## 2019-06-17 DIAGNOSIS — I745 Embolism and thrombosis of iliac artery: Secondary | ICD-10-CM | POA: Insufficient documentation

## 2019-06-17 DIAGNOSIS — F419 Anxiety disorder, unspecified: Secondary | ICD-10-CM | POA: Insufficient documentation

## 2019-06-17 DIAGNOSIS — Z79899 Other long term (current) drug therapy: Secondary | ICD-10-CM | POA: Insufficient documentation

## 2019-06-17 DIAGNOSIS — Z23 Encounter for immunization: Secondary | ICD-10-CM

## 2019-06-17 DIAGNOSIS — C7931 Secondary malignant neoplasm of brain: Secondary | ICD-10-CM | POA: Insufficient documentation

## 2019-06-17 DIAGNOSIS — C3411 Malignant neoplasm of upper lobe, right bronchus or lung: Secondary | ICD-10-CM

## 2019-06-17 DIAGNOSIS — Z5111 Encounter for antineoplastic chemotherapy: Secondary | ICD-10-CM | POA: Diagnosis not present

## 2019-06-17 DIAGNOSIS — Z923 Personal history of irradiation: Secondary | ICD-10-CM | POA: Diagnosis not present

## 2019-06-17 DIAGNOSIS — C7949 Secondary malignant neoplasm of other parts of nervous system: Secondary | ICD-10-CM | POA: Diagnosis not present

## 2019-06-17 DIAGNOSIS — Z9221 Personal history of antineoplastic chemotherapy: Secondary | ICD-10-CM | POA: Diagnosis not present

## 2019-06-17 DIAGNOSIS — Z5112 Encounter for antineoplastic immunotherapy: Secondary | ICD-10-CM | POA: Diagnosis not present

## 2019-06-17 LAB — CMP (CANCER CENTER ONLY)
ALT: 15 U/L (ref 0–44)
AST: 13 U/L — ABNORMAL LOW (ref 15–41)
Albumin: 3.4 g/dL — ABNORMAL LOW (ref 3.5–5.0)
Alkaline Phosphatase: 80 U/L (ref 38–126)
Anion gap: 11 (ref 5–15)
BUN: 14 mg/dL (ref 8–23)
CO2: 21 mmol/L — ABNORMAL LOW (ref 22–32)
Calcium: 9.4 mg/dL (ref 8.9–10.3)
Chloride: 106 mmol/L (ref 98–111)
Creatinine: 0.84 mg/dL (ref 0.44–1.00)
GFR, Est AFR Am: >60 mL/min (ref >60)
GFR, Estimated: >60 mL/min (ref >60)
Glucose, Bld: 175 mg/dL — ABNORMAL HIGH (ref 70–99)
Potassium: 4 mmol/L (ref 3.5–5.1)
Sodium: 138 mmol/L (ref 135–145)
Total Bilirubin: 0.3 mg/dL (ref 0.3–1.2)
Total Protein: 6.7 g/dL (ref 6.5–8.1)

## 2019-06-17 LAB — CBC WITH DIFFERENTIAL (CANCER CENTER ONLY)
Abs Immature Granulocytes: 0.11 10*3/uL — ABNORMAL HIGH (ref 0.00–0.07)
Basophils Absolute: 0 10*3/uL (ref 0.0–0.1)
Basophils Relative: 0 %
Eosinophils Absolute: 0 10*3/uL (ref 0.0–0.5)
Eosinophils Relative: 0 %
HCT: 37.8 % (ref 36.0–46.0)
Hemoglobin: 11.9 g/dL — ABNORMAL LOW (ref 12.0–15.0)
Immature Granulocytes: 1 %
Lymphocytes Relative: 4 %
Lymphs Abs: 0.5 10*3/uL — ABNORMAL LOW (ref 0.7–4.0)
MCH: 32.5 pg (ref 26.0–34.0)
MCHC: 31.5 g/dL (ref 30.0–36.0)
MCV: 103.3 fL — ABNORMAL HIGH (ref 80.0–100.0)
Monocytes Absolute: 0.6 10*3/uL (ref 0.1–1.0)
Monocytes Relative: 5 %
Neutro Abs: 9.8 10*3/uL — ABNORMAL HIGH (ref 1.7–7.7)
Neutrophils Relative %: 90 %
Platelet Count: 230 10*3/uL (ref 150–400)
RBC: 3.66 MIL/uL — ABNORMAL LOW (ref 3.87–5.11)
RDW: 15.3 % (ref 11.5–15.5)
WBC Count: 10.9 10*3/uL — ABNORMAL HIGH (ref 4.0–10.5)
nRBC: 0 % (ref 0.0–0.2)

## 2019-06-17 MED ORDER — INFLUENZA VAC A&B SA ADJ QUAD 0.5 ML IM PRSY
PREFILLED_SYRINGE | INTRAMUSCULAR | Status: AC
  Filled 2019-06-17: qty 0.5

## 2019-06-17 MED ORDER — DEXAMETHASONE SODIUM PHOSPHATE 10 MG/ML IJ SOLN
10.0000 mg | Freq: Once | INTRAMUSCULAR | Status: AC
  Administered 2019-06-17: 10 mg via INTRAVENOUS

## 2019-06-17 MED ORDER — CYANOCOBALAMIN 1000 MCG/ML IJ SOLN: 1000.0000 ug | Freq: Once | INTRAMUSCULAR | Status: DC

## 2019-06-17 MED ORDER — SODIUM CHLORIDE 0.9 % IV SOLN
500.0000 mg/m2 | Freq: Once | INTRAVENOUS | Status: AC
  Administered 2019-06-17: 900 mg via INTRAVENOUS
  Filled 2019-06-17: qty 20

## 2019-06-17 MED ORDER — SODIUM CHLORIDE 0.9 % IV SOLN
Freq: Once | INTRAVENOUS | Status: AC
  Administered 2019-06-17: 09:00:00 via INTRAVENOUS
  Filled 2019-06-17: qty 250

## 2019-06-17 MED ORDER — INFLUENZA VAC A&B SA ADJ QUAD 0.5 ML IM PRSY
0.5000 mL | PREFILLED_SYRINGE | Freq: Once | INTRAMUSCULAR | Status: AC
  Administered 2019-06-17: 0.5 mL via INTRAMUSCULAR

## 2019-06-17 MED ORDER — DEXAMETHASONE SODIUM PHOSPHATE 10 MG/ML IJ SOLN
INTRAMUSCULAR | Status: AC
  Filled 2019-06-17: qty 1

## 2019-06-17 NOTE — Progress Notes (Signed)
[facility] [phone]   [phone]  OFFICE PROGRESS NOTE  [name], MD [address] [place] [address]  DIAGNOSIS: Stage IV (T2a, N0, M1b) non-small cell lung cancer, adenocarcinoma with negative EGFR and ALK mutations diagnosed in January 2015 and presented with right upper lobe lung mass in addition to a solitary brain metastasis.  PRIOR THERAPY: 1) Status post stereotactic radiotherapy to a solitary right parietal brain lesion under the care of Dr. [name] on 10/16/2013. 2) Status post palliative radiotherapy to the right lung tumor under the care of Dr. [name] completed on 12/05/2013. 3) Systemic chemotherapy with carboplatin for AUC of 5 and Alimta 500 mg/M2 every 3 weeks. First dose Jan 06 2014. Status post 6 cycles.  CURRENT THERAPY: Systemic chemotherapy with maintenance Alimta 500 MG/M2 every 3 weeks, status post 87 cycles.  INTERVAL HISTORY: Alexis [name] 68 y.o. female returns to the clinic today for follow-up visit.  The patient is feeling fine today with no concerning complaints except for mild fatigue.  She denied having any chest pain, shortness of breath, cough or hemoptysis.  She denied having any fever or chills.  She has no nausea, vomiting, diarrhea or constipation.  She has no headache or visual changes.  The patient denied having any weight loss or night sweats.  She has been tolerating her treatment with maintenance Alimta fairly well.  She had repeat CT scan of the chest, abdomen pelvis performed recently and she is here for evaluation and discussion of her scan results.   MEDICAL HISTORY: Past Medical History:  Diagnosis Date   Anxiety    Anxiety 06/20/2016   Cervical cancer ([facility])    Encounter for antineoplastic chemotherapy 07/20/2015   Malignant neoplasm of right upper lobe of lung ([facility])     non small cell lung cancer adenocarcioma with brain meta    ALLERGIES:  is allergic to codeine.  MEDICATIONS:    Current Outpatient Medications  Medication Sig Dispense Refill   acetaminophen (TYLENOL) 500 MG tablet Take 500 mg by mouth every 6 (six) hours as needed for mild pain or headache. Reported on 11/02/2015     ALPRAZolam (XANAX) 0.25 MG tablet Take 1 tablet (0.25 mg total) by mouth 3 (three) times daily as needed. 30 tablet 0   Ascorbic Acid (VITAMIN C GUMMIE PO) Take 1 each by mouth every morning.     dexamethasone (DECADRON) 4 MG tablet Take 1 pill twice a day the day before, the day of, and the day after chemotherapy. 40 tablet 2   folic acid (FOLVITE) 1 MG tablet TAKE 1 TABLET BY MOUTH EVERY DAY 90 tablet 0   HYDROcodone-acetaminophen (NORCO/VICODIN) 5-325 MG tablet TAKE 1 TABLET BY MOUTH 3 TIMES A DAY FOR 5 DAYS AS NEEDED FOR PAIN     Multiple Vitamin (MULTIVITAMIN WITH MINERALS) TABS tablet Take 1 tablet by mouth every morning.     omeprazole (PRILOSEC) 20 MG capsule Take 1 capsule (20 mg total) by mouth daily. As needed for reflux or indigestion. 42 capsule PRN   ondansetron (ZOFRAN) 8 MG tablet Take one tab po before chemo. 30 tablet 0   OVER THE COUNTER MEDICATION Take 1 tablet by mouth every morning. (Vitamin A)     phenazopyridine (PYRIDIUM) 200 MG tablet Take 1 tablet (200 mg total) by mouth 3 (three) times daily as needed for pain. (Patient not taking: Reported on 03/04/2019) 15 tablet 0   prochlorperazine (COMPAZINE) 10 MG tablet  Take 1 tablet (10 mg total) by mouth every 6 (six) hours as needed for nausea or vomiting. 60 tablet 0   senna-docusate (SENOKOT-S) 8.6-50 MG tablet Take 1 tablet by mouth daily. 30 tablet prn   valACYclovir (VALTREX) 1000 MG tablet Take 1,000 mg by mouth 3 (three) times daily.     No current facility-administered medications for this visit.     SURGICAL HISTORY:  Past Surgical History:  Procedure Laterality Date   ABDOMINAL HYSTERECTOMY     COLOSTOMY TAKEDOWN N/A 07/10/2014   Procedure: LAPAROSCOPIC LYSIS OF ADHESIONS (90 MIN) LAPAROSCOPIC  ASSISTED COLOSTOMY CLOSURE, RIGID PROCTOSIGMOIDOSCOPY;  Surgeon: Jackolyn Confer, MD;  Location: [facility];  Service: General;  Laterality: N/A;   LAPAROTOMY N/A 11/03/2013   Procedure: EXPLORATORY LAPAROTOMY, DRAINAGE OF INTRA  ABDOMINAL ABSCESSES, MOBILIZATION OF SPLENIC FLEXURE, SIGMOID COLECTOMY WITH COLOSTOMY;  Surgeon: Odis Hollingshead, MD;  Location: [facility];  Service: General;  Laterality: N/A;   VIDEO BRONCHOSCOPY Bilateral 08/30/2013   Procedure: VIDEO BRONCHOSCOPY WITH FLUORO;  Surgeon: Tanda Rockers, MD;  Location: [facility];  Service: Cardiopulmonary;  Laterality: Bilateral;    REVIEW OF SYSTEMS:  Constitutional: positive for fatigue Eyes: negative Ears, nose, mouth, throat, and face: negative Respiratory: negative Cardiovascular: negative Gastrointestinal: negative Genitourinary:negative Integument/breast: negative Hematologic/lymphatic: negative Musculoskeletal:negative Neurological: negative Behavioral/Psych: negative Endocrine: negative Allergic/Immunologic: negative   PHYSICAL EXAMINATION: General appearance: alert, cooperative and no distress Head: Normocephalic, without obvious abnormality, atraumatic Neck: no adenopathy, no JVD, supple, symmetrical, trachea midline and thyroid not enlarged, symmetric, no tenderness/mass/nodules Lymph nodes: Cervical, supraclavicular, and axillary nodes normal. Resp: clear to auscultation bilaterally Back: symmetric, no curvature. ROM normal. No CVA tenderness. Cardio: regular rate and rhythm, S1, S2 normal, no murmur, click, rub or gallop GI: soft, non-tender; bowel sounds normal; no masses,  no organomegaly Extremities: extremities normal, atraumatic, no cyanosis or edema Neurologic: Alert and oriented X 3, normal strength and tone. Normal symmetric reflexes. Normal coordination and gait   ECOG PERFORMANCE STATUS: 1 - Symptomatic but completely ambulatory  Blood pressure 137/68, pulse 96, temperature 98.5 F (36.9 C),  temperature source Temporal, resp. rate 18, height _0  (1.626 m), weight 184 lb 9.6 oz (83.7 kg), SpO2 99 %.  LABORATORY DATA: Lab Results  Component Value Date   WBC 10.9 (H) 06/17/2019   HGB 11.9 (L) 06/17/2019   HCT 37.8 06/17/2019   MCV 103.3 (H) 06/17/2019   PLT 230 06/17/2019      Chemistry      Component Value Date/Time   NA 140 05/27/2019 0755   NA 139 08/14/2017 0837   K 4.0 05/27/2019 0755   K 4.4 08/14/2017 0837   CL 106 05/27/2019 0755   CO2 24 05/27/2019 0755   CO2 24 08/14/2017 0837   BUN 13 05/27/2019 0755   BUN 13.3 08/14/2017 0837   CREATININE 0.75 05/27/2019 0755   CREATININE 0.8 08/14/2017 0837      Component Value Date/Time   CALCIUM 9.9 05/27/2019 0755   CALCIUM 9.3 08/14/2017 0837   ALKPHOS 90 05/27/2019 0755   ALKPHOS 107 08/14/2017 0837   AST 11 (L) 05/27/2019 0755   AST 12 08/14/2017 0837   ALT 12 05/27/2019 0755   ALT 12 08/14/2017 0837   BILITOT 0.3 05/27/2019 0755   BILITOT 0.37 08/14/2017 0837       RADIOGRAPHIC STUDIES: Ct Chest W Contrast  Result Date: 06/13/2019 CLINICAL DATA:  History of non-small cell lung cancer of the right upper lobe, initially presented with solitary brain  metastases. Currently undergoing systemic therapy as of May 27, 2019. Post stereotactic brain radiation. Also with history of cervical cancer. EXAM: CT CHEST, ABDOMEN, AND PELVIS WITH CONTRAST TECHNIQUE: Multidetector CT imaging of the chest, abdomen and pelvis was performed following the standard protocol during bolus administration of intravenous contrast. CONTRAST:  138m ISOVUE-300 IOPAMIDOL (ISOVUE-300) INJECTION 61% COMPARISON:  03/22/2019 FINDINGS: CT CHEST FINDINGS Cardiovascular: Signs of calcified atherosclerotic and noncalcified atherosclerotic disease in the thoracic aorta. No signs of aneurysmal dilation. Central pulmonary vasculature is unremarkable. Heart size normal without pericardial effusion Mediastinum/Nodes: Small lymph nodes less than  a cm throughout the mediastinum. Largest in the AP window unchanged from previous exam measuring 8 mm. No supraclavicular adenopathy. No axillary lymphadenopathy. No hilar adenopathy. Largest lymph node in the hila on the right at 7 mm (image 27, series 7) Lungs/Pleura: Post treatment changes in the right upper lobe as before. Airways are patent. Paraseptal and centrilobular apically emphysematous changes as before. No signs of new or suspicious pulmonary lesion. No consolidation or evidence of pleural effusion. Musculoskeletal: No signs of chest wall mass. CT ABDOMEN PELVIS FINDINGS Hepatobiliary: No focal hepatic lesion.  Portal vein is patent. Biliary tree is normal Pancreas: Unremarkable. No pancreatic ductal dilatation or surrounding inflammatory changes. Spleen: Normal in size without focal abnormality. Adrenals/Urinary Tract: Normal adrenal glands. Symmetric enhancement of bilateral kidneys without focal lesion. Stomach/Bowel: No signs of acute gastrointestinal process. Stool throughout the colon. Postoperative changes of partial colonic resection likely sigmoid resection with pelvic anastomosis similar prior study. Vascular/Lymphatic: Moderate calcific and noncalcific atherosclerotic changes throughout the abdominal aorta. Patent abdominal vasculature. No signs of upper abdominal or retroperitoneal lymphadenopathy. No pelvic adenopathy. 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 The internal iliac artery remains patent. This is a chronic finding. The vessel is opacified with contrast distally as on prior studies. Reproductive: Post hysterectomy. Left ovary likely remaining in place. No signs of pelvic mass. Other: No signs of free air.  No peritoneal nodularity. Musculoskeletal: No signs of acute bone finding or evidence of destructive bone process. IMPRESSION: Stable exam without signs of new disease. Post treatment changes in the  right upper lobe as before. Scattered small mediastinal lymph nodes less than a cm, attention on follow-up. 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 The vessel opacified distally, attention on follow-up is suggested. Aortic Atherosclerosis (ICD10-I70.0) and Emphysema (ICD10-J43.9). Electronically Signed   By: GZetta BillsM.D.   On: 06/13/2019 13:22   Ct Abdomen Pelvis W Contrast  Result Date: 06/13/2019 CLINICAL DATA:  History of non-small cell lung cancer of the right upper lobe, initially presented with solitary brain metastases. Currently undergoing systemic therapy as of May 27, 2019. Post stereotactic brain radiation. Also with history of cervical cancer. EXAM: CT CHEST, ABDOMEN, AND PELVIS WITH CONTRAST TECHNIQUE: Multidetector CT imaging of the chest, abdomen and pelvis was performed following the standard protocol during bolus administration of intravenous contrast. CONTRAST:  10101mISOVUE-300 IOPAMIDOL (ISOVUE-300) INJECTION 61% COMPARISON:  03/22/2019 FINDINGS: CT CHEST FINDINGS Cardiovascular: Signs of calcified atherosclerotic and noncalcified atherosclerotic disease in the thoracic aorta. No signs of aneurysmal dilation. Central pulmonary vasculature is unremarkable. Heart size normal without pericardial effusion Mediastinum/Nodes: Small lymph nodes less than a cm throughout the mediastinum. Largest in the AP window unchanged from previous exam measuring 8 mm. No supraclavicular adenopathy. No axillary lymphadenopathy. No hilar adenopathy. Largest lymph node in the hila  on the right at 7 mm (image 27, series 7) Lungs/Pleura: Post treatment changes in the right upper lobe as before. Airways are patent. Paraseptal and centrilobular apically emphysematous changes as before. No signs of new or suspicious pulmonary lesion. No consolidation or evidence of pleural effusion. Musculoskeletal: No signs of chest wall mass. CT ABDOMEN PELVIS  FINDINGS Hepatobiliary: No focal hepatic lesion.  Portal vein is patent. Biliary tree is normal Pancreas: Unremarkable. No pancreatic ductal dilatation or surrounding inflammatory changes. Spleen: Normal in size without focal abnormality. Adrenals/Urinary Tract: Normal adrenal glands. Symmetric enhancement of bilateral kidneys without focal lesion. Stomach/Bowel: No signs of acute gastrointestinal process. Stool throughout the colon. Postoperative changes of partial colonic resection likely sigmoid resection with pelvic anastomosis similar prior study. Vascular/Lymphatic: Moderate calcific and noncalcific atherosclerotic changes throughout the abdominal aorta. Patent abdominal vasculature. No signs of upper abdominal or retroperitoneal lymphadenopathy. No pelvic adenopathy. 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 The internal iliac artery remains patent. This is a chronic finding. The vessel is opacified with contrast distally as on prior studies. Reproductive: Post hysterectomy. Left ovary likely remaining in place. No signs of pelvic mass. Other: No signs of free air.  No peritoneal nodularity. Musculoskeletal: No signs of acute bone finding or evidence of destructive bone process. IMPRESSION: Stable exam without signs of new disease. Post treatment changes in the right upper lobe as before. Scattered small mediastinal lymph nodes less than a cm, attention on follow-up. 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 The vessel opacified distally, attention on follow-up is suggested. Aortic Atherosclerosis (ICD10-I70.0) and Emphysema (ICD10-J43.9). Electronically Signed   By: Zetta Bills M.D.   On: 06/13/2019 13:22    ASSESSMENT AND PLAN:  This is a very pleasant 68 years old white female with metastatic non-small cell lung cancer, adenocarcinoma status post induction systemic  chemotherapy with carboplatin and Alimta with partial response. The patient is currently on maintenance treatment with single agent Alimta status post 87 cycles. The patient continues to tolerate this treatment well with no concerning adverse effects. She had repeat CT scan of the chest, abdomen pelvis performed recently.  I personally and independently reviewed the scans and discussed the results with the patient today. Her scan showed no concerning findings for disease progression.  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  I recommended for her to get referral from her primary care physician to a vascular surgeon for evaluation. I recommended for her to proceed with her treatment and she will start cycle #88 today. She will come back for follow-up visit in 3 weeks for evaluation before the next cycle of her treatment. She was advised to call immediately if she has any concerning symptoms in the interval. For anxiety the patient will continue her current treatment with Xanax on a as needed basis.   The patient voices understanding of current disease status and treatment options and is in agreement with the current care plan. All questions were answered. The patient knows to call the clinic with any problems, questions or concerns. We can certainly see the patient much sooner if necessary.  Disclaimer: This note was dictated with voice recognition software. Similar sounding words can inadvertently be transcribed and may not be corrected upon review.

## 2019-06-17 NOTE — Telephone Encounter (Signed)
Scheduled appt per 10/19 los - pt to get an updated schedule next visit.

## 2019-06-17 NOTE — Progress Notes (Signed)
Oncology Nurse Navigator Documentation  Oncology Nurse Navigator Flowsheets 06/17/2019  Navigator Location [facility]  Navigator Encounter Type Clinic/[facility]  Telephone -  Patient Visit Type MedOnc/patient is currently on treatment.  She is getting treatment #88 today.  She has not complaints or concerns.  I offered her encouragement and support.   Treatment Phase Treatment  Barriers/Navigation Needs (No Data)  Education -  Interventions -  Coordination of Care -  Education Method -  Support Groups/Services -  Acuity -  Acuity Level 1 -  Time Spent with Patient 15  Time Spent with Patient (Retired) -

## 2019-06-20 DIAGNOSIS — C3491 Malignant neoplasm of unspecified part of right bronchus or lung: Secondary | ICD-10-CM | POA: Diagnosis not present

## 2019-06-20 DIAGNOSIS — F419 Anxiety disorder, unspecified: Secondary | ICD-10-CM | POA: Diagnosis not present

## 2019-06-20 DIAGNOSIS — C718 Malignant neoplasm of overlapping sites of brain: Secondary | ICD-10-CM | POA: Diagnosis not present

## 2019-06-20 DIAGNOSIS — I745 Embolism and thrombosis of iliac artery: Secondary | ICD-10-CM | POA: Diagnosis not present

## 2019-06-20 DIAGNOSIS — K219 Gastro-esophageal reflux disease without esophagitis: Secondary | ICD-10-CM | POA: Diagnosis not present

## 2019-07-03 ENCOUNTER — Other Ambulatory Visit: Payer: Self-pay | Admitting: Radiation Therapy

## 2019-07-08 ENCOUNTER — Inpatient Hospital Stay: Payer: Medicare Other

## 2019-07-08 ENCOUNTER — Encounter: Payer: Self-pay | Admitting: Internal Medicine

## 2019-07-08 ENCOUNTER — Inpatient Hospital Stay (HOSPITAL_BASED_OUTPATIENT_CLINIC_OR_DEPARTMENT_OTHER): Payer: Medicare Other | Admitting: Internal Medicine

## 2019-07-08 ENCOUNTER — Other Ambulatory Visit: Payer: Self-pay

## 2019-07-08 ENCOUNTER — Inpatient Hospital Stay: Payer: Medicare Other | Attending: Internal Medicine

## 2019-07-08 VITALS — BP 121/74 | HR 100 | Temp 98.3°F | Resp 18 | Ht 64.0 in | Wt 182.9 lb

## 2019-07-08 DIAGNOSIS — C3411 Malignant neoplasm of upper lobe, right bronchus or lung: Secondary | ICD-10-CM | POA: Insufficient documentation

## 2019-07-08 DIAGNOSIS — Z5111 Encounter for antineoplastic chemotherapy: Secondary | ICD-10-CM

## 2019-07-08 DIAGNOSIS — Z5112 Encounter for antineoplastic immunotherapy: Secondary | ICD-10-CM | POA: Diagnosis not present

## 2019-07-08 DIAGNOSIS — C7931 Secondary malignant neoplasm of brain: Secondary | ICD-10-CM | POA: Diagnosis not present

## 2019-07-08 DIAGNOSIS — Z923 Personal history of irradiation: Secondary | ICD-10-CM | POA: Insufficient documentation

## 2019-07-08 DIAGNOSIS — Z9221 Personal history of antineoplastic chemotherapy: Secondary | ICD-10-CM | POA: Insufficient documentation

## 2019-07-08 DIAGNOSIS — Z79899 Other long term (current) drug therapy: Secondary | ICD-10-CM | POA: Diagnosis not present

## 2019-07-08 DIAGNOSIS — F419 Anxiety disorder, unspecified: Secondary | ICD-10-CM | POA: Insufficient documentation

## 2019-07-08 LAB — CBC WITH DIFFERENTIAL (CANCER CENTER ONLY)
Abs Immature Granulocytes: 0.11 10*3/uL — ABNORMAL HIGH (ref 0.00–0.07)
Basophils Absolute: 0 10*3/uL (ref 0.0–0.1)
Basophils Relative: 0 %
Eosinophils Absolute: 0 10*3/uL (ref 0.0–0.5)
Eosinophils Relative: 0 %
HCT: 38.6 % (ref 36.0–46.0)
Hemoglobin: 12.4 g/dL (ref 12.0–15.0)
Immature Granulocytes: 1 %
Lymphocytes Relative: 4 %
Lymphs Abs: 0.5 10*3/uL — ABNORMAL LOW (ref 0.7–4.0)
MCH: 32.1 pg (ref 26.0–34.0)
MCHC: 32.1 g/dL (ref 30.0–36.0)
MCV: 100 fL (ref 80.0–100.0)
Monocytes Absolute: 1 10*3/uL (ref 0.1–1.0)
Monocytes Relative: 7 %
Neutro Abs: 13 10*3/uL — ABNORMAL HIGH (ref 1.7–7.7)
Neutrophils Relative %: 88 %
Platelet Count: 348 10*3/uL (ref 150–400)
RBC: 3.86 MIL/uL — ABNORMAL LOW (ref 3.87–5.11)
RDW: 15 % (ref 11.5–15.5)
WBC Count: 14.6 10*3/uL — ABNORMAL HIGH (ref 4.0–10.5)
nRBC: 0 % (ref 0.0–0.2)

## 2019-07-08 LAB — CMP (CANCER CENTER ONLY)
ALT: 15 U/L (ref 0–44)
AST: 11 U/L — ABNORMAL LOW (ref 15–41)
Albumin: 3.6 g/dL (ref 3.5–5.0)
Alkaline Phosphatase: 91 U/L (ref 38–126)
Anion gap: 11 (ref 5–15)
BUN: 10 mg/dL (ref 8–23)
CO2: 23 mmol/L (ref 22–32)
Calcium: 10 mg/dL (ref 8.9–10.3)
Chloride: 105 mmol/L (ref 98–111)
Creatinine: 0.8 mg/dL (ref 0.44–1.00)
GFR, Est AFR Am: >60 mL/min (ref >60)
GFR, Estimated: >60 mL/min (ref >60)
Glucose, Bld: 155 mg/dL — ABNORMAL HIGH (ref 70–99)
Potassium: 4.3 mmol/L (ref 3.5–5.1)
Sodium: 139 mmol/L (ref 135–145)
Total Bilirubin: 0.4 mg/dL (ref 0.3–1.2)
Total Protein: 7.1 g/dL (ref 6.5–8.1)

## 2019-07-08 MED ORDER — CYANOCOBALAMIN 1000 MCG/ML IJ SOLN
1000.0000 ug | Freq: Once | INTRAMUSCULAR | Status: AC
  Administered 2019-07-08: 1000 ug via INTRAMUSCULAR

## 2019-07-08 MED ORDER — CYANOCOBALAMIN 1000 MCG/ML IJ SOLN
INTRAMUSCULAR | Status: AC
  Filled 2019-07-08: qty 1

## 2019-07-08 MED ORDER — SODIUM CHLORIDE 0.9% FLUSH
10.0000 mL | Freq: Once | INTRAVENOUS | Status: DC
  Filled 2019-07-08: qty 10

## 2019-07-08 MED ORDER — DEXAMETHASONE SODIUM PHOSPHATE 10 MG/ML IJ SOLN
INTRAMUSCULAR | Status: AC
  Filled 2019-07-08: qty 1

## 2019-07-08 MED ORDER — DEXAMETHASONE SODIUM PHOSPHATE 10 MG/ML IJ SOLN
10.0000 mg | Freq: Once | INTRAMUSCULAR | Status: AC
  Administered 2019-07-08: 10 mg via INTRAVENOUS

## 2019-07-08 MED ORDER — SODIUM CHLORIDE 0.9 % IJ SOLN: 10.0000 mL | INTRAMUSCULAR | Status: DC | PRN

## 2019-07-08 MED ORDER — SODIUM CHLORIDE 0.9 % IV SOLN
Freq: Once | INTRAVENOUS | Status: AC
  Administered 2019-07-08: 10:00:00 via INTRAVENOUS
  Filled 2019-07-08: qty 250

## 2019-07-08 MED ORDER — SODIUM CHLORIDE 0.9 % IV SOLN
500.0000 mg/m2 | Freq: Once | INTRAVENOUS | Status: AC
  Administered 2019-07-08: 11:00:00 900 mg via INTRAVENOUS
  Filled 2019-07-08: qty 20

## 2019-07-08 MED ORDER — HEPARIN SOD (PORK) LOCK FLUSH 100 UNIT/ML IV SOLN
500.0000 [IU] | Freq: Once | INTRAVENOUS | Status: DC | PRN
  Filled 2019-07-08: qty 5

## 2019-07-08 NOTE — Patient Instructions (Signed)
[facility] Discharge Instructions for Patients Receiving Chemotherapy  Today you received the following chemotherapy agents Pemetrexed (ALIMTA).  To help prevent nausea and vomiting after your treatment, we encourage you to take your nausea medication as prescribed.  If you develop nausea and vomiting that is not controlled by your nausea medication, call the clinic.   BELOW ARE SYMPTOMS THAT SHOULD BE REPORTED IMMEDIATELY:  *FEVER GREATER THAN 100.5 F  *CHILLS WITH OR WITHOUT FEVER  NAUSEA AND VOMITING THAT IS NOT CONTROLLED WITH YOUR NAUSEA MEDICATION  *UNUSUAL SHORTNESS OF BREATH  *UNUSUAL BRUISING OR BLEEDING  TENDERNESS IN MOUTH AND THROAT WITH OR WITHOUT PRESENCE OF ULCERS  *URINARY PROBLEMS  *BOWEL PROBLEMS  UNUSUAL RASH Items with * indicate a potential emergency and should be followed up as soon as possible.  Feel free to call the clinic should you have any questions or concerns. The clinic phone number is [phone].  Please show the [facility] at check-in to the Emergency Department and triage nurse.

## 2019-07-08 NOTE — Progress Notes (Signed)
[place] [phone]   [phone]  OFFICE PROGRESS NOTE  [name], MD [address] [place] [address]  DIAGNOSIS: Stage IV (T2a, N0, M1b) non-small cell lung cancer, adenocarcinoma with negative EGFR and ALK mutations diagnosed in January 2015 and presented with right upper lobe lung mass in addition to a solitary brain metastasis.  PRIOR THERAPY: 1) Status post stereotactic radiotherapy to a solitary right parietal brain lesion under the care of Dr. [name] on 10/16/2013. 2) Status post palliative radiotherapy to the right lung tumor under the care of Dr. [name] completed on 12/05/2013. 3) Systemic chemotherapy with carboplatin for AUC of 5 and Alimta 500 mg/M2 every 3 weeks. First dose Jan 06 2014. Status post 6 cycles.  CURRENT THERAPY: Systemic chemotherapy with maintenance Alimta 500 MG/M2 every 3 weeks, status post 88 cycles.  INTERVAL HISTORY: Alexis [name] 68 y.o. female returns to the clinic today for follow-up visit.  The patient is feeling fine today with no concerning complaints.  She denied having any current chest pain, shortness of breath, cough or hemoptysis.  She denied having any fever or chills.  She has no nausea, vomiting, diarrhea or constipation.  She denied having any headache or visual changes.  She continues to tolerate her maintenance treatment with Alimta fairly well.  She is here today for evaluation before starting cycle #89.  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  MEDICATIONS:  Current Outpatient Medications  Medication Sig Dispense Refill   acetaminophen (TYLENOL) 500 MG tablet Take 500 mg by mouth every 6 (six)  hours as needed for mild pain or headache. Reported on 11/02/2015     ALPRAZolam (XANAX) 0.25 MG tablet Take 1 tablet (0.25 mg total) by mouth 3 (three) times daily as needed. 30 tablet 0   Ascorbic Acid (VITAMIN C GUMMIE PO) Take 1 each by mouth every morning.     dexamethasone (DECADRON) 4 MG tablet Take 1 pill twice a day the day before, the day of, and the day after chemotherapy. 40 tablet 2   folic acid (FOLVITE) 1 MG tablet TAKE 1 TABLET BY MOUTH EVERY DAY 90 tablet 0   HYDROcodone-acetaminophen (NORCO/VICODIN) 5-325 MG tablet TAKE 1 TABLET BY MOUTH 3 TIMES A DAY FOR 5 DAYS AS NEEDED FOR PAIN     Multiple Vitamin (MULTIVITAMIN WITH MINERALS) TABS tablet Take 1 tablet by mouth every morning.     omeprazole (PRILOSEC) 20 MG capsule Take 1 capsule (20 mg total) by mouth daily. As needed for reflux or indigestion. 42 capsule PRN   ondansetron (ZOFRAN) 8 MG tablet Take one tab po before chemo. 30 tablet 0   OVER THE COUNTER MEDICATION Take 1 tablet by mouth every morning. (Vitamin A)     phenazopyridine (PYRIDIUM) 200 MG tablet Take 1 tablet (200 mg total) by mouth 3 (three) times daily as needed for pain. (Patient not taking: Reported on 03/04/2019) 15 tablet 0   prochlorperazine (COMPAZINE) 10 MG tablet Take 1 tablet (10 mg total) by mouth every 6 (six) hours as needed for nausea or vomiting. 60 tablet 0   senna-docusate (SENOKOT-S) 8.6-50 MG tablet  Take 1 tablet by mouth daily. 30 tablet prn   valACYclovir (VALTREX) 1000 MG tablet Take 1,000 mg by mouth 3 (three) times daily.     No current facility-administered medications for this visit.     SURGICAL HISTORY:  Past Surgical History:  Procedure Laterality Date   ABDOMINAL HYSTERECTOMY     COLOSTOMY TAKEDOWN N/A 07/10/2014   Procedure: LAPAROSCOPIC LYSIS OF ADHESIONS (90 MIN) LAPAROSCOPIC ASSISTED COLOSTOMY CLOSURE, RIGID PROCTOSIGMOIDOSCOPY;  Surgeon: Jackolyn Confer, MD;  Location: [facility];  Service: General;  Laterality: N/A;     LAPAROTOMY N/A 11/03/2013   Procedure: EXPLORATORY LAPAROTOMY, DRAINAGE OF INTRA  ABDOMINAL ABSCESSES, MOBILIZATION OF SPLENIC FLEXURE, SIGMOID COLECTOMY WITH COLOSTOMY;  Surgeon: Odis Hollingshead, MD;  Location: [facility];  Service: General;  Laterality: N/A;   VIDEO BRONCHOSCOPY Bilateral 08/30/2013   Procedure: VIDEO BRONCHOSCOPY WITH FLUORO;  Surgeon: Tanda Rockers, MD;  Location: [facility];  Service: Cardiopulmonary;  Laterality: Bilateral;    REVIEW OF SYSTEMS:  A comprehensive review of systems was negative except for: Constitutional: positive for fatigue   PHYSICAL EXAMINATION: General appearance: alert, cooperative and no distress Head: Normocephalic, without obvious abnormality, atraumatic Neck: no adenopathy, no JVD, supple, symmetrical, trachea midline and thyroid not enlarged, symmetric, no tenderness/mass/nodules Lymph nodes: Cervical, supraclavicular, and axillary nodes normal. Resp: clear to auscultation bilaterally Back: symmetric, no curvature. ROM normal. No CVA tenderness. Cardio: regular rate and rhythm, S1, S2 normal, no murmur, click, rub or gallop GI: soft, non-tender; bowel sounds normal; no masses,  no organomegaly Extremities: extremities normal, atraumatic, no cyanosis or edema   ECOG PERFORMANCE STATUS: 1 - Symptomatic but completely ambulatory  Blood pressure 121/74, pulse 100, temperature 98.3 F (36.8 C), temperature source Temporal, resp. rate 18, height 5' 4" (1.626 m), weight 182 lb 14.4 oz (83 kg), SpO2 100 %.  LABORATORY DATA: Lab Results  Component Value Date   WBC 14.6 (H) 07/08/2019   HGB 12.4 07/08/2019   HCT 38.6 07/08/2019   MCV 100.0 07/08/2019   PLT 348 07/08/2019      Chemistry      Component Value Date/Time   NA 138 06/17/2019 0818   NA 139 08/14/2017 0837   K 4.0 06/17/2019 0818   K 4.4 08/14/2017 0837   CL 106 06/17/2019 0818   CO2 21 (L) 06/17/2019 0818   CO2 24 08/14/2017 0837   BUN 14 06/17/2019 0818   BUN 13.3  08/14/2017 0837   CREATININE 0.84 06/17/2019 0818   CREATININE 0.8 08/14/2017 0837      Component Value Date/Time   CALCIUM 9.4 06/17/2019 0818   CALCIUM 9.3 08/14/2017 0837   ALKPHOS 80 06/17/2019 0818   ALKPHOS 107 08/14/2017 0837   AST 13 (L) 06/17/2019 0818   AST 12 08/14/2017 0837   ALT 15 06/17/2019 0818   ALT 12 08/14/2017 0837   BILITOT 0.3 06/17/2019 0818   BILITOT 0.37 08/14/2017 0837       RADIOGRAPHIC STUDIES: Ct Chest W Contrast  Result Date: 06/13/2019 CLINICAL DATA:  History of non-small cell lung cancer of the right upper lobe, initially presented with solitary brain metastases. Currently undergoing systemic therapy as of May 27, 2019. Post stereotactic brain radiation. Also with history of cervical cancer. EXAM: CT CHEST, ABDOMEN, AND PELVIS WITH CONTRAST TECHNIQUE: Multidetector CT imaging of the chest, abdomen and pelvis was performed following the standard protocol during bolus administration of intravenous contrast. CONTRAST:  129m ISOVUE-300 IOPAMIDOL (ISOVUE-300) INJECTION 61% COMPARISON:  03/22/2019 FINDINGS: CT CHEST FINDINGS  Cardiovascular: Signs of calcified atherosclerotic and noncalcified atherosclerotic disease in the thoracic aorta. No signs of aneurysmal dilation. Central pulmonary vasculature is unremarkable. Heart size normal without pericardial effusion Mediastinum/Nodes: Small lymph nodes less than a cm throughout the mediastinum. Largest in the AP window unchanged from previous exam measuring 8 mm. No supraclavicular adenopathy. No axillary lymphadenopathy. No hilar adenopathy. Largest lymph node in the hila on the right at 7 mm (image 27, series 7) Lungs/Pleura: Post treatment changes in the right upper lobe as before. Airways are patent. Paraseptal and centrilobular apically emphysematous changes as before. No signs of new or suspicious pulmonary lesion. No consolidation or evidence of pleural effusion. Musculoskeletal: No signs of chest wall mass.  CT ABDOMEN PELVIS FINDINGS Hepatobiliary: No focal hepatic lesion.  Portal vein is patent. Biliary tree is normal Pancreas: Unremarkable. No pancreatic ductal dilatation or surrounding inflammatory changes. Spleen: Normal in size without focal abnormality. Adrenals/Urinary Tract: Normal adrenal glands. Symmetric enhancement of bilateral kidneys without focal lesion. Stomach/Bowel: No signs of acute gastrointestinal process. Stool throughout the colon. Postoperative changes of partial colonic resection likely sigmoid resection with pelvic anastomosis similar prior study. Vascular/Lymphatic: Moderate calcific and noncalcific atherosclerotic changes throughout the abdominal aorta. Patent abdominal vasculature. No signs of upper abdominal or retroperitoneal lymphadenopathy. No pelvic adenopathy. 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 The internal iliac artery remains patent. This is a chronic finding. The vessel is opacified with contrast distally as on prior studies. Reproductive: Post hysterectomy. Left ovary likely remaining in place. No signs of pelvic mass. Other: No signs of free air.  No peritoneal nodularity. Musculoskeletal: No signs of acute bone finding or evidence of destructive bone process. IMPRESSION: Stable exam without signs of new disease. Post treatment changes in the right upper lobe as before. Scattered small mediastinal lymph nodes less than a cm, attention on follow-up. 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 The vessel opacified distally, attention on follow-up is suggested. Aortic Atherosclerosis (ICD10-I70.0) and Emphysema (ICD10-J43.9). Electronically Signed   By: Zetta Bills M.D.   On: 06/13/2019 13:22   Ct Abdomen Pelvis W Contrast  Result Date: 06/13/2019 CLINICAL DATA:  History of non-small cell lung cancer of the right upper lobe, initially  presented with solitary brain metastases. Currently undergoing systemic therapy as of May 27, 2019. Post stereotactic brain radiation. Also with history of cervical cancer. EXAM: CT CHEST, ABDOMEN, AND PELVIS WITH CONTRAST TECHNIQUE: Multidetector CT imaging of the chest, abdomen and pelvis was performed following the standard protocol during bolus administration of intravenous contrast. CONTRAST:  185m ISOVUE-300 IOPAMIDOL (ISOVUE-300) INJECTION 61% COMPARISON:  03/22/2019 FINDINGS: CT CHEST FINDINGS Cardiovascular: Signs of calcified atherosclerotic and noncalcified atherosclerotic disease in the thoracic aorta. No signs of aneurysmal dilation. Central pulmonary vasculature is unremarkable. Heart size normal without pericardial effusion Mediastinum/Nodes: Small lymph nodes less than a cm throughout the mediastinum. Largest in the AP window unchanged from previous exam measuring 8 mm. No supraclavicular adenopathy. No axillary lymphadenopathy. No hilar adenopathy. Largest lymph node in the hila on the right at 7 mm (image 27, series 7) Lungs/Pleura: Post treatment changes in the right upper lobe as before. Airways are patent. Paraseptal and centrilobular apically emphysematous changes as before. No signs of new or suspicious pulmonary lesion. No consolidation or evidence of pleural effusion. Musculoskeletal: No signs of chest wall mass. CT ABDOMEN PELVIS FINDINGS Hepatobiliary: No focal hepatic lesion.  Portal  vein is patent. Biliary tree is normal Pancreas: Unremarkable. No pancreatic ductal dilatation or surrounding inflammatory changes. Spleen: Normal in size without focal abnormality. Adrenals/Urinary Tract: Normal adrenal glands. Symmetric enhancement of bilateral kidneys without focal lesion. Stomach/Bowel: No signs of acute gastrointestinal process. Stool throughout the colon. Postoperative changes of partial colonic resection likely sigmoid resection with pelvic anastomosis similar prior study.  Vascular/Lymphatic: Moderate calcific and noncalcific atherosclerotic changes throughout the abdominal aorta. Patent abdominal vasculature. No signs of upper abdominal or retroperitoneal lymphadenopathy. No pelvic adenopathy. 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 The internal iliac artery remains patent. This is a chronic finding. The vessel is opacified with contrast distally as on prior studies. Reproductive: Post hysterectomy. Left ovary likely remaining in place. No signs of pelvic mass. Other: No signs of free air.  No peritoneal nodularity. Musculoskeletal: No signs of acute bone finding or evidence of destructive bone process. IMPRESSION: Stable exam without signs of new disease. Post treatment changes in the right upper lobe as before. Scattered small mediastinal lymph nodes less than a cm, attention on follow-up. 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 The vessel opacified distally, attention on follow-up is suggested. Aortic Atherosclerosis (ICD10-I70.0) and Emphysema (ICD10-J43.9). Electronically Signed   By: Zetta Bills M.D.   On: 06/13/2019 13:22    ASSESSMENT AND PLAN:  This is a very pleasant 68 years old white female with metastatic non-small cell lung cancer, adenocarcinoma status post induction systemic chemotherapy with carboplatin and Alimta with partial response. The patient is currently on maintenance treatment with single agent Alimta status post 88 cycles. The patient continues to tolerate her treatment well with no concerning adverse effects. I recommended for her to proceed with cycle #89 today as planned. She will come back for follow-up visit in 3 weeks for evaluation before the next cycle of her treatment. For anxiety the patient will continue her current treatment with Xanax on a as needed basis.   She was advised to call immediately if  she has any concerning symptoms in the interval. The patient voices understanding of current disease status and treatment options and is in agreement with the current care plan. All questions were answered. The patient knows to call the clinic with any problems, questions or concerns. We can certainly see the patient much sooner if necessary.  Disclaimer: This note was dictated with voice recognition software. Similar sounding words can inadvertently be transcribed and may not be corrected upon review.

## 2019-07-12 ENCOUNTER — Ambulatory Visit
Admission: RE | Admit: 2019-07-12 | Discharge: 2019-07-12 | Disposition: A | Discharge status: Home / Self Care | Payer: Medicare Other | Source: Ambulatory Visit | Attending: Radiation Oncology | Admitting: Radiation Oncology

## 2019-07-12 ENCOUNTER — Other Ambulatory Visit: Payer: Self-pay

## 2019-07-12 DIAGNOSIS — C7931 Secondary malignant neoplasm of brain: Secondary | ICD-10-CM

## 2019-07-12 DIAGNOSIS — C7800 Secondary malignant neoplasm of unspecified lung: Secondary | ICD-10-CM | POA: Diagnosis not present

## 2019-07-12 DIAGNOSIS — C7949 Secondary malignant neoplasm of other parts of nervous system: Secondary | ICD-10-CM

## 2019-07-12 MED ORDER — GADOBENATE DIMEGLUMINE 529 MG/ML IV SOLN
17.0000 mL | Freq: Once | INTRAVENOUS | Status: AC | PRN
  Administered 2019-07-12: 13:00:00 17 mL via INTRAVENOUS

## 2019-07-15 ENCOUNTER — Ambulatory Visit: Payer: Self-pay | Admitting: Radiation Oncology

## 2019-07-15 ENCOUNTER — Inpatient Hospital Stay: Payer: Medicare Other

## 2019-07-16 ENCOUNTER — Other Ambulatory Visit: Payer: Self-pay

## 2019-07-16 ENCOUNTER — Ambulatory Visit
Admission: RE | Admit: 2019-07-16 | Discharge: 2019-07-16 | Disposition: A | Discharge status: Home / Self Care | Payer: Medicare Other | Source: Ambulatory Visit | Attending: Radiation Oncology | Admitting: Radiation Oncology

## 2019-07-16 ENCOUNTER — Encounter: Payer: Self-pay | Admitting: Radiation Oncology

## 2019-07-16 VITALS — Wt 182.3 lb

## 2019-07-16 DIAGNOSIS — Z85118 Personal history of other malignant neoplasm of bronchus and lung: Secondary | ICD-10-CM | POA: Diagnosis not present

## 2019-07-16 DIAGNOSIS — C7931 Secondary malignant neoplasm of brain: Secondary | ICD-10-CM | POA: Diagnosis not present

## 2019-07-16 DIAGNOSIS — C7949 Secondary malignant neoplasm of other parts of nervous system: Secondary | ICD-10-CM

## 2019-07-16 DIAGNOSIS — Z08 Encounter for follow-up examination after completed treatment for malignant neoplasm: Secondary | ICD-10-CM | POA: Diagnosis not present

## 2019-07-16 DIAGNOSIS — C3411 Malignant neoplasm of upper lobe, right bronchus or lung: Secondary | ICD-10-CM

## 2019-07-16 DIAGNOSIS — Z79899 Other long term (current) drug therapy: Secondary | ICD-10-CM | POA: Diagnosis not present

## 2019-07-16 NOTE — Progress Notes (Signed)
Anxious to obtain MRI results. Denies pain. Reports occasional headache that resolves on its own. Denies dizziness, nausea or vomiting. Denies tinnitus or diplopia. Reports a steady gait and normal hand eye coordination. Denies any recent falls. Reports having been diagnosed and treated for shingles recently. Reports she received a treatment on Monday and her next one is scheduled for 3 weeks from Monday.

## 2019-07-16 NOTE — Progress Notes (Signed)
Radiation Oncology         [phone]) [phone]  Outpatient Follow Up - Conducted via telephone due to current COVID-19 concerns for limiting patient exposure  I spoke with the patient to conduct this consult visit via telephone to spare the patient unnecessary potential exposure in the healthcare setting during the current COVID-19 pandemic. The patient was notified in advance and was offered a [facility] meeting to allow for face to face communication but unfortunately reported that they did not have the appropriate resources/technology to support such a visit and instead preferred to proceed with a telephone discussion.  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  Interval Since Last Radiation: 5 years, 7 months  10/28/2013 through 12/05/2013: The patient was treated to the right lung tumor to a dose of 50 gray in 25 fractions using a 3-D conformal technique. Daily image guidance was used for the patient's treatment.  10/16/2013 SRS Treatment: PTV1: Rt Parietal 16mm target was treated using 3 Arcs to a prescription dose of 20 Gy. ExacTrac Snap verification was performed for each couch angle.  Narrative:  The patient returns today for routine follow-up. In summary this is a 68 y.o. patient with a history of metastatic lung cancer to the brain who was treated in two sessions in 2015 for her brain disease, followed by local control to the right lung. Since her treatment, she has done well and continues to be NED in the brain, and continues to remain with stable disease on systemic alimta. She has received her 89th cycle of this on 07/08/2019 and recent restaging CTs show stability without progression. 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 She is no longer taking these medications. Her most recent MRI of the brain was on 07/12/2019 and showed stability of her treated lesion in the parietal region without concerns for active malignancy and no new lesions.   On review of systems, the patient reports that she is doing well overall. She denies any headaches. She denies any visual or auditory changes. She denies any chest pain, shortness of breath, cough, fevers, chills, night sweats, unintended weight changes. She feels tired the day or two after chemo but reports she feels great otherwise. She denies any bowel or bladder disturbances, and denies abdominal pain, nausea or vomiting.  She denies any new musculoskeletal or joint aches or pains, new skin lesions or concerns. A complete review of systems is obtained and is otherwise negative.    Past Medical History:  Past Medical History:  Diagnosis Date   Anxiety    Anxiety 06/20/2016   Cervical cancer ([place])    Encounter for antineoplastic chemotherapy 07/20/2015   Malignant neoplasm of right upper lobe of lung ([facility])     non small cell lung cancer adenocarcioma with brain meta    Past Surgical History: Past Surgical History:  Procedure Laterality Date   ABDOMINAL HYSTERECTOMY     COLOSTOMY TAKEDOWN N/A 07/10/2014   Procedure: LAPAROSCOPIC LYSIS OF ADHESIONS (90 MIN) LAPAROSCOPIC ASSISTED COLOSTOMY CLOSURE, RIGID PROCTOSIGMOIDOSCOPY;  Surgeon: Jackolyn Confer, MD;  Location: [facility];  Service: General;  Laterality: N/A;   LAPAROTOMY N/A 11/03/2013   Procedure: EXPLORATORY LAPAROTOMY, DRAINAGE OF INTRA  ABDOMINAL ABSCESSES,  MOBILIZATION OF SPLENIC FLEXURE, SIGMOID COLECTOMY WITH COLOSTOMY;  Surgeon: Odis Hollingshead, MD;  Location: [facility];  Service: General;  Laterality: N/A;   VIDEO BRONCHOSCOPY Bilateral 08/30/2013   Procedure: VIDEO BRONCHOSCOPY WITH FLUORO;  Surgeon: Tanda Rockers, MD;  Location: [facility];   Service: Cardiopulmonary;  Laterality: Bilateral;    Social History:  Social History   Socioeconomic History   Marital status: Married    Spouse name: Not on file   Number of children: Not on file   Years of education: Not on file   Highest education level: Not on file  Occupational History   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 [place] resource strain: Not on file   Food insecurity    Worry: Not on file    Inability: Not on file   Transportation needs    Medical: Not on file    Non-medical: Not on file  Tobacco Use   Smoking status: Former Smoker    Packs/day: 1.00    Years: 40.00    Pack years: 40.00    Types: Cigarettes    Quit date: 09/27/2013    Years since quitting: 5.8   Smokeless tobacco: Never Used  Substance and Sexual Activity   Alcohol use: No   Drug use: No   Sexual activity: Yes  Lifestyle   Physical activity    Days per week: Not on file    Minutes per session: Not on file   Stress: Not on file  Relationships   Social connections    Talks on phone: Not on file    Gets together: Not on file    Attends religious service: Not on file    Active member of club or organization: Not on file    Attends meetings of clubs or organizations: Not on file    Relationship status: Not on file   Intimate partner violence    Fear of current or ex partner: Not on file    Emotionally abused: Not on file    Physically abused: Not on file    Forced sexual activity: Not on file  Other Topics Concern   Not on file  Social History Narrative   Not on file  The patient is married. She lives in [place], [place].  Family History: Family History  Problem Relation Age of Onset   Emphysema Father        smoked   Lung cancer Father        smoked   Cancer Mother    Hypertension Mother    COPD Mother     ALLERGIES:  is allergic to codeine.  Meds: Current Outpatient Medications  Medication Sig Dispense  Refill   acetaminophen (TYLENOL) 500 MG tablet Take 500 mg by mouth every 6 (six) hours as needed for mild pain or headache. Reported on 11/02/2015     ALPRAZolam (XANAX) 0.25 MG tablet Take 1 tablet (0.25 mg total) by mouth 3 (three) times daily as needed. 30 tablet 0   Ascorbic Acid (VITAMIN C GUMMIE PO) Take 1 each by mouth every morning.     [employer] ACID CONTROLLER 10 MG tablet SMARTSIG:1 Tablet(s) By Mouth Every 12 Hours PRN     dexamethasone (DECADRON) 4 MG tablet Take 1 pill twice a day the day before, the day of, and the day after chemotherapy. 40 tablet 2   folic acid (FOLVITE) 1 MG tablet TAKE 1 TABLET BY MOUTH EVERY DAY 90 tablet 0  HYDROcodone-acetaminophen (NORCO/VICODIN) 5-325 MG tablet TAKE 1 TABLET BY MOUTH 3 TIMES A DAY FOR 5 DAYS AS NEEDED FOR PAIN     Multiple Vitamin (MULTIVITAMIN WITH MINERALS) TABS tablet Take 1 tablet by mouth every morning.     omeprazole (PRILOSEC) 20 MG capsule Take 1 capsule (20 mg total) by mouth daily. As needed for reflux or indigestion. 42 capsule PRN   ondansetron (ZOFRAN) 8 MG tablet Take one tab po before chemo. 30 tablet 0   OVER THE COUNTER MEDICATION Take 1 tablet by mouth every morning. (Vitamin A)     phenazopyridine (PYRIDIUM) 200 MG tablet Take 1 tablet (200 mg total) by mouth 3 (three) times daily as needed for pain. (Patient not taking: Reported on 03/04/2019) 15 tablet 0   prochlorperazine (COMPAZINE) 10 MG tablet Take 1 tablet (10 mg total) by mouth every 6 (six) hours as needed for nausea or vomiting. 60 tablet 0   senna-docusate (SENOKOT-S) 8.6-50 MG tablet Take 1 tablet by mouth daily. 30 tablet prn   valACYclovir (VALTREX) 1000 MG tablet Take 1,000 mg by mouth 3 (three) times daily.     No current facility-administered medications for this encounter.     Physical Findings: Unable to assess due to type of encounter  Lab Findings: Lab Results  Component Value Date   WBC 14.6 (H) 07/08/2019   HGB 12.4 07/08/2019   HCT  38.6 07/08/2019   MCV 100.0 07/08/2019   PLT 348 07/08/2019     Radiographic Findings: Mr [name] AO Contrast  Result Date: 07/12/2019 CLINICAL DATA:  Metastatic lung cancer post SRS EXAM: MRI HEAD WITHOUT AND WITH CONTRAST TECHNIQUE: Multiplanar, multiecho pulse sequences of the brain and surrounding structures were obtained without and with intravenous contrast. CONTRAST:  66mL MULTIHANCE GADOBENATE DIMEGLUMINE 529 MG/ML IV SOLN COMPARISON:  None. FINDINGS: Brain: Heterogeneously enhancing right parietal lesion is similar in size measuring up to 1.5 cm. Surrounding edema is not substantially changed in extent. There is no new mass or abnormal enhancement. Chronic blood products are again identified in association with the above lesion. No new hemorrhage. No acute infarction. Additional patchy and confluent areas of T2 hyperintensity in the supratentorial white matter likely reflect stable chronic microvascular and/or therapy related changes. There is no hydrocephalus. Vascular: Major vessel flow voids at the skull base are preserved. Skull and upper cervical spine: Normal marrow signal is preserved. Sinuses/Orbits: Minor mucosal thickening. Bilateral lens replacements. Other: Sella is unremarkable.  Minimal mastoid fluid opacification. IMPRESSION: Similar size of treated right parietal lesion with stable surrounding edema. No new mass. Electronically Signed   By: Macy Mis M.D.   On: 07/12/2019 13:35    Impression/Plan: 1. 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 The patient continues to do exceptionally well. Current NCCN guidelines recommend repeating imaging q 6 months indefinitely. She is in agreement with this plan.  She will continue with her Alimta under the care of Dr. Julien Nordmann.     Given current concerns for patient exposure during the COVID-19 pandemic, this encounter was conducted via  telephone.  The patient has given verbal consent for this type of encounter. The time spent during this encounter was 15 minutes and 50% of that time was spent in the coordination of his care. The attendants for this meeting include Shona Simpson, [facility] and Alexis [name] and her husband Alexis [name].  During the encounter, Shona Simpson [facility] was  located at [facility] Radiation Oncology Department.  Alexis [name]  was located at home with her husband.     Carola Rhine, PAC

## 2019-07-16 NOTE — Progress Notes (Signed)
Spoke with the patient via phone she is aware of Alexis [name]'s ca;; at 0830 and is waiting. No other issues or complaints of at this time.

## 2019-07-16 NOTE — Patient Instructions (Signed)
Coronavirus (COVID-19) Are you at risk?  Are you at risk for the Coronavirus (COVID-19)?  To be considered HIGH RISK for Coronavirus (COVID-19), you have to meet the following criteria:  . Traveled to [place], [place], [place], [place] or [place]; or in the [place] to [place], [place], [place], or [place]; and have fever, cough, and shortness of breath within the last 2 weeks of travel OR . Been in close contact with a person diagnosed with COVID-19 within the last 2 weeks and have fever, cough, and shortness of breath . IF YOU DO NOT MEET THESE CRITERIA, YOU ARE CONSIDERED LOW RISK FOR COVID-19.  What to do if you are HIGH RISK for COVID-19?  . If you are having a medical emergency, call 911. . Seek medical care right away. Before you go to a doctor's office, urgent care or emergency department, call ahead and tell them about your recent travel, contact with someone diagnosed with COVID-19, and your symptoms. You should receive instructions from your physician's office regarding next steps of care.  . When you arrive at healthcare provider, tell the healthcare staff immediately you have returned from visiting [place], [place], [place], [place] or [place]; or traveled in the [place] to [place], [place], [place], or [place]; in the last two weeks or you have been in close contact with a person diagnosed with COVID-19 in the last 2 weeks.   . Tell the health care staff about your symptoms: fever, cough and shortness of breath. . After you have been seen by a medical provider, you will be either: o Tested for (COVID-19) and discharged home on quarantine except to seek medical care if symptoms worsen, and asked to  - Stay home and avoid contact with others until you get your results (4-5 days)  - Avoid travel on public transportation if possible (such as bus, train, or airplane) or o Sent to the Emergency Department by EMS for evaluation, COVID-19 testing, and possible  admission depending on your condition and test results.  What to do if you are LOW RISK for COVID-19?  Reduce your risk of any infection by using the same precautions used for avoiding the common cold or flu:  . Wash your hands often with soap and warm water for at least 20 seconds.  If soap and water are not readily available, use an alcohol-based hand sanitizer with at least 60% alcohol.  . If coughing or sneezing, cover your mouth and nose by coughing or sneezing into the elbow areas of your shirt or coat, into a tissue or into your sleeve (not your hands). . Avoid shaking hands with others and consider head nods or verbal greetings only. . Avoid touching your eyes, nose, or mouth with unwashed hands.  . Avoid close contact with people who are sick. . Avoid places or events with large numbers of people in one location, like concerts or sporting events. . Carefully consider travel plans you have or are making. . If you are planning any travel outside or inside the [place], visit the CDC's Travelers' Health webpage for the latest health notices. . If you have some symptoms but not all symptoms, continue to monitor at home and seek medical attention if your symptoms worsen. . If you are having a medical emergency, call 911.   ADDITIONAL HEALTHCARE OPTIONS FOR PATIENTS  [facility] / e-Visit: [email or web address]         [facility]: [phone]  [facility]: [phone]                   [facility]: [phone]   

## 2019-07-27 DIAGNOSIS — Z23 Encounter for immunization: Secondary | ICD-10-CM | POA: Diagnosis not present

## 2019-07-27 NOTE — Progress Notes (Signed)
[name] OFFICE PROGRESS NOTE  [name], MD [address] [place] [address]  DIAGNOSIS: Stage IV (T2a, N0, M1b) non-small cell lung cancer, adenocarcinoma with negative EGFR and ALK mutations diagnosed in January 2015 and presented with right upper lobe lung mass in addition to a solitary brain metastasis.  PRIOR THERAPY: 1) Status post stereotactic radiotherapy to a solitary right parietal brain lesion under the care of Dr. [name] on 10/16/2013. 2) Status post palliative radiotherapy to the right lung tumor under the care of Dr. [name] completed on 12/05/2013. 3) Systemic chemotherapy with carboplatin for AUC of 5 and Alimta 500 mg/M2 every 3 weeks. First dose Jan 06 2014. Status post 6 cycles.  CURRENT THERAPY: Systemic chemotherapy with maintenance Alimta 500 MG/M2 every 3 weeks, status post 89 cycles.  INTERVAL HISTORY: Alexis [name] 68 y.o. female returns to the clinic for a follow up visit. The patient is feeling well today without any concerning complaints. She is meeting with a vascular surgeon for consultation later this week. The patient continues to tolerate treatment with single agent Aimta well without any side adverse side effects. Denies any fever, chills, or weight loss. She gets night sweats sometimes secondary to the decadron. Denies any chest pain, shortness of breath, cough, or hemoptysis. Denies any nausea, vomiting, or diarrhea. She has mild constipation after chemotherapy which is managed successfully with a stool softener. Denies any headache or visual changes. The patient is here today for evaluation prior to starting cycle #90  MEDICAL HISTORY: Past Medical History:  Diagnosis Date  . Anxiety   . Anxiety 06/20/2016  . Cervical cancer ([place])   . Encounter for antineoplastic chemotherapy 07/20/2015  . Malignant neoplasm of right upper lobe of lung (HCC)     non small cell lung cancer adenocarcioma with brain meta    ALLERGIES:  is allergic to  codeine.  MEDICATIONS:  Current Outpatient Medications  Medication Sig Dispense Refill  . acetaminophen (TYLENOL) 500 MG tablet Take 500 mg by mouth every 6 (six) hours as needed for mild pain or headache. Reported on 11/02/2015    . ALPRAZolam (XANAX) 0.25 MG tablet Take 1 tablet (0.25 mg total) by mouth 3 (three) times daily as needed. 30 tablet 0  . Ascorbic Acid (VITAMIN C GUMMIE PO) Take 1 each by mouth every morning.    . [employer] ACID CONTROLLER 10 MG tablet SMARTSIG:1 Tablet(s) By Mouth Every 12 Hours PRN    . dexamethasone (DECADRON) 4 MG tablet Take 1 pill twice a day the day before, the day of, and the day after chemotherapy. 40 tablet 2  . folic acid (FOLVITE) 1 MG tablet TAKE 1 TABLET BY MOUTH EVERY DAY 90 tablet 0  . HYDROcodone-acetaminophen (NORCO/VICODIN) 5-325 MG tablet TAKE 1 TABLET BY MOUTH 3 TIMES A DAY FOR 5 DAYS AS NEEDED FOR PAIN    . Multiple Vitamin (MULTIVITAMIN WITH MINERALS) TABS tablet Take 1 tablet by mouth every morning.    . ondansetron (ZOFRAN) 8 MG tablet Take one tab po before chemo. 30 tablet 0  . OVER THE COUNTER MEDICATION Take 1 tablet by mouth every morning. (Vitamin A)    . prochlorperazine (COMPAZINE) 10 MG tablet Take 1 tablet (10 mg total) by mouth every 6 (six) hours as needed for nausea or vomiting. 60 tablet 0  . senna-docusate (SENOKOT-S) 8.6-50 MG tablet Take 1 tablet by mouth daily. 30 tablet prn  . valACYclovir (VALTREX) 1000 MG tablet Take 1,000 mg by mouth  3 (three) times daily.     No current facility-administered medications for this visit.     SURGICAL HISTORY:  Past Surgical History:  Procedure Laterality Date  . ABDOMINAL HYSTERECTOMY    . COLOSTOMY TAKEDOWN N/A 07/10/2014   Procedure: LAPAROSCOPIC LYSIS OF ADHESIONS (90 MIN) LAPAROSCOPIC ASSISTED COLOSTOMY CLOSURE, RIGID PROCTOSIGMOIDOSCOPY;  Surgeon: Jackolyn Confer, MD;  Location: [facility];  Service: General;  Laterality: N/A;  . LAPAROTOMY N/A 11/03/2013   Procedure: EXPLORATORY  LAPAROTOMY, DRAINAGE OF INTRA  ABDOMINAL ABSCESSES, MOBILIZATION OF SPLENIC FLEXURE, SIGMOID COLECTOMY WITH COLOSTOMY;  Surgeon: Odis Hollingshead, MD;  Location: [facility];  Service: General;  Laterality: N/A;  . VIDEO BRONCHOSCOPY Bilateral 08/30/2013   Procedure: VIDEO BRONCHOSCOPY WITH FLUORO;  Surgeon: Tanda Rockers, MD;  Location: [facility];  Service: Cardiopulmonary;  Laterality: Bilateral;    REVIEW OF SYSTEMS:   Review of Systems  Constitutional: Negative for appetite change, chills, fatigue, fever and unexpected weight change.  HENT: Negative for mouth sores, nosebleeds, sore throat and trouble swallowing.   Eyes: Negative for eye problems and icterus.  Respiratory: Negative for cough, hemoptysis, shortness of breath and wheezing.   Cardiovascular: Negative for chest pain and leg swelling.  Gastrointestinal: Positive for mild constipation following chemotherapy. Negative for abdominal pain, diarrhea, nausea and vomiting.  Genitourinary: Negative for bladder incontinence, difficulty urinating, dysuria, frequency and hematuria.   Musculoskeletal: Negative for back pain, gait problem, neck pain and neck stiffness.  Skin: Negative for itching and rash.  Neurological: Negative for dizziness, extremity weakness, gait problem, headaches, light-headedness and seizures.  Hematological: Negative for adenopathy. Does not bruise/bleed easily.  Psychiatric/Behavioral: Negative for confusion, depression and sleep disturbance. The patient is not nervous/anxious.     PHYSICAL EXAMINATION:  There were no vitals taken for this visit.  ECOG PERFORMANCE STATUS: 1 - Symptomatic but completely ambulatory  Physical Exam  Constitutional: Oriented to person, place, and time and well-developed, well-nourished, and in no distress.  HENT:  Head: Normocephalic and atraumatic.  Mouth/Throat: Oropharynx is clear and moist. No oropharyngeal exudate.  Eyes: Conjunctivae are normal. Right eye exhibits no  discharge. Left eye exhibits no discharge. No scleral icterus.  Neck: Normal range of motion. Neck supple.  Cardiovascular: Normal rate, regular rhythm, normal heart sounds and intact distal pulses.   Pulmonary/Chest: Effort normal and breath sounds normal. No respiratory distress. No wheezes. No rales.  Abdominal: Soft. Bowel sounds are normal. Exhibits no distension and no mass. There is no tenderness.  Musculoskeletal: Normal range of motion. Exhibits no edema.  Lymphadenopathy:    No cervical adenopathy.  Neurological: Alert and oriented to person, place, and time. Exhibits normal muscle tone. Gait normal. Coordination normal.  Skin: Skin is warm and dry. No rash noted. Not diaphoretic. No erythema. No pallor.  Psychiatric: Mood, memory and judgment normal.  Vitals reviewed.  LABORATORY DATA: Lab Results  Component Value Date   WBC 10.2 07/29/2019   HGB 12.2 07/29/2019   HCT 38.6 07/29/2019   MCV 101.6 (H) 07/29/2019   PLT 347 07/29/2019      Chemistry      Component Value Date/Time   NA 141 07/29/2019 0750   NA 139 08/14/2017 0837   K 4.4 07/29/2019 0750   K 4.4 08/14/2017 0837   CL 106 07/29/2019 0750   CO2 20 (L) 07/29/2019 0750   CO2 24 08/14/2017 0837   BUN 15 07/29/2019 0750   BUN 13.3 08/14/2017 0837   CREATININE 0.88 07/29/2019 0750   CREATININE 0.8  08/14/2017 0837      Component Value Date/Time   CALCIUM 9.6 07/29/2019 0750   CALCIUM 9.3 08/14/2017 0837   ALKPHOS 96 07/29/2019 0750   ALKPHOS 107 08/14/2017 0837   AST 12 (L) 07/29/2019 0750   AST 12 08/14/2017 0837   ALT 15 07/29/2019 0750   ALT 12 08/14/2017 0837   BILITOT 0.4 07/29/2019 0750   BILITOT 0.37 08/14/2017 0837       RADIOGRAPHIC STUDIES:  Mr [name] XU Contrast  Result Date: 07/12/2019 CLINICAL DATA:  Metastatic lung cancer post SRS EXAM: MRI HEAD WITHOUT AND WITH CONTRAST TECHNIQUE: Multiplanar, multiecho pulse sequences of the brain and surrounding structures were obtained without  and with intravenous contrast. CONTRAST:  67m MULTIHANCE GADOBENATE DIMEGLUMINE 529 MG/ML IV SOLN COMPARISON:  None. FINDINGS: Brain: Heterogeneously enhancing right parietal lesion is similar in size measuring up to 1.5 cm. Surrounding edema is not substantially changed in extent. There is no new mass or abnormal enhancement. Chronic blood products are again identified in association with the above lesion. No new hemorrhage. No acute infarction. Additional patchy and confluent areas of T2 hyperintensity in the supratentorial white matter likely reflect stable chronic microvascular and/or therapy related changes. There is no hydrocephalus. Vascular: Major vessel flow voids at the skull base are preserved. Skull and upper cervical spine: Normal marrow signal is preserved. Sinuses/Orbits: Minor mucosal thickening. Bilateral lens replacements. Other: Sella is unremarkable.  Minimal mastoid fluid opacification. IMPRESSION: Similar size of treated right parietal lesion with stable surrounding edema. No new mass. Electronically Signed   By: PMacy MisM.D.   On: 07/12/2019 13:35     ASSESSMENT/PLAN:  This isa very pleasant 68year old Caucasian female with stage IV non-small cell lung cancer, adenocarcinoma who presented with a right upper lobe lungmass in addition to a solitary brain metastatsis. She was diagnosed in January 2015. The patient had completed induction systemic chemotherapy with carboplatin and Alimta with a partial response. The patient is currently being treated with single agentmaintenanceAlimta. She is status post 89 cycles.   Labs were reviewed with the patient. I recommend she proceed with cycle #90 today as scheduled.  We will see her back for a follow up visit in 3 weeks for evaluation before starting cycle #91.   She will follow up with her vascular surgeon for consultation later this week as scheduled. She will let [place] if she is planning to have any procedures done.    She will continue to take her decadron the day before, the day of, and the day after chemotherapy.   The patient was advised to call immediately if she has any concerning symptoms in the interval. The patient voices understanding of current disease status and treatment options and is in agreement with the current care plan. All questions were answered. The patient knows to call the clinic with any problems, questions or concerns. We can certainly see the patient much sooner if necessary  No orders of the defined types were placed in this encounter.    Rithik Odea L Enyla Lisbon, PA-C 07/29/19

## 2019-07-29 ENCOUNTER — Telehealth: Payer: Self-pay | Admitting: Internal Medicine

## 2019-07-29 ENCOUNTER — Other Ambulatory Visit: Payer: Self-pay

## 2019-07-29 ENCOUNTER — Inpatient Hospital Stay: Payer: Medicare Other

## 2019-07-29 ENCOUNTER — Inpatient Hospital Stay (HOSPITAL_BASED_OUTPATIENT_CLINIC_OR_DEPARTMENT_OTHER): Payer: Medicare Other | Admitting: Physician Assistant

## 2019-07-29 VITALS — BP 114/62 | HR 115 | Temp 98.2°F | Ht 64.0 in | Wt 184.5 lb

## 2019-07-29 VITALS — HR 99

## 2019-07-29 DIAGNOSIS — Z5111 Encounter for antineoplastic chemotherapy: Secondary | ICD-10-CM

## 2019-07-29 DIAGNOSIS — C3411 Malignant neoplasm of upper lobe, right bronchus or lung: Secondary | ICD-10-CM

## 2019-07-29 DIAGNOSIS — Z9221 Personal history of antineoplastic chemotherapy: Secondary | ICD-10-CM | POA: Diagnosis not present

## 2019-07-29 DIAGNOSIS — Z923 Personal history of irradiation: Secondary | ICD-10-CM | POA: Diagnosis not present

## 2019-07-29 DIAGNOSIS — C7931 Secondary malignant neoplasm of brain: Secondary | ICD-10-CM | POA: Diagnosis not present

## 2019-07-29 DIAGNOSIS — F419 Anxiety disorder, unspecified: Secondary | ICD-10-CM | POA: Diagnosis not present

## 2019-07-29 DIAGNOSIS — Z5112 Encounter for antineoplastic immunotherapy: Secondary | ICD-10-CM | POA: Diagnosis not present

## 2019-07-29 LAB — CBC WITH DIFFERENTIAL (CANCER CENTER ONLY)
Abs Immature Granulocytes: 0.08 10*3/uL — ABNORMAL HIGH (ref 0.00–0.07)
Basophils Absolute: 0 10*3/uL (ref 0.0–0.1)
Basophils Relative: 0 %
Eosinophils Absolute: 0 10*3/uL (ref 0.0–0.5)
Eosinophils Relative: 0 %
HCT: 38.6 % (ref 36.0–46.0)
Hemoglobin: 12.2 g/dL (ref 12.0–15.0)
Immature Granulocytes: 1 %
Lymphocytes Relative: 5 %
Lymphs Abs: 0.5 10*3/uL — ABNORMAL LOW (ref 0.7–4.0)
MCH: 32.1 pg (ref 26.0–34.0)
MCHC: 31.6 g/dL (ref 30.0–36.0)
MCV: 101.6 fL — ABNORMAL HIGH (ref 80.0–100.0)
Monocytes Absolute: 0.6 10*3/uL (ref 0.1–1.0)
Monocytes Relative: 6 %
Neutro Abs: 9 10*3/uL — ABNORMAL HIGH (ref 1.7–7.7)
Neutrophils Relative %: 88 %
Platelet Count: 347 10*3/uL (ref 150–400)
RBC: 3.8 MIL/uL — ABNORMAL LOW (ref 3.87–5.11)
RDW: 15.2 % (ref 11.5–15.5)
WBC Count: 10.2 10*3/uL (ref 4.0–10.5)
nRBC: 0 % (ref 0.0–0.2)

## 2019-07-29 LAB — CMP (CANCER CENTER ONLY)
ALT: 15 U/L (ref 0–44)
AST: 12 U/L — ABNORMAL LOW (ref 15–41)
Albumin: 3.5 g/dL (ref 3.5–5.0)
Alkaline Phosphatase: 96 U/L (ref 38–126)
Anion gap: 15 (ref 5–15)
BUN: 15 mg/dL (ref 8–23)
CO2: 20 mmol/L — ABNORMAL LOW (ref 22–32)
Calcium: 9.6 mg/dL (ref 8.9–10.3)
Chloride: 106 mmol/L (ref 98–111)
Creatinine: 0.88 mg/dL (ref 0.44–1.00)
GFR, Est AFR Am: >60 mL/min (ref >60)
GFR, Estimated: >60 mL/min (ref >60)
Glucose, Bld: 182 mg/dL — ABNORMAL HIGH (ref 70–99)
Potassium: 4.4 mmol/L (ref 3.5–5.1)
Sodium: 141 mmol/L (ref 135–145)
Total Bilirubin: 0.4 mg/dL (ref 0.3–1.2)
Total Protein: 7.2 g/dL (ref 6.5–8.1)

## 2019-07-29 MED ORDER — DEXAMETHASONE SODIUM PHOSPHATE 10 MG/ML IJ SOLN
INTRAMUSCULAR | Status: AC
  Filled 2019-07-29: qty 1

## 2019-07-29 MED ORDER — SODIUM CHLORIDE 0.9 % IV SOLN
500.0000 mg/m2 | Freq: Once | INTRAVENOUS | Status: AC
  Administered 2019-07-29: 900 mg via INTRAVENOUS
  Filled 2019-07-29: qty 20

## 2019-07-29 MED ORDER — SODIUM CHLORIDE 0.9 % IV SOLN
Freq: Once | INTRAVENOUS | Status: AC
  Administered 2019-07-29: 09:00:00 via INTRAVENOUS
  Filled 2019-07-29: qty 250

## 2019-07-29 MED ORDER — DEXAMETHASONE SODIUM PHOSPHATE 10 MG/ML IJ SOLN
10.0000 mg | Freq: Once | INTRAMUSCULAR | Status: AC
  Administered 2019-07-29: 10 mg via INTRAVENOUS

## 2019-07-29 NOTE — Patient Instructions (Signed)
[facility] Discharge Instructions for Patients Receiving Chemotherapy  Today you received the following chemotherapy agents Pemetrexed (ALIMTA).  To help prevent nausea and vomiting after your treatment, we encourage you to take your nausea medication as prescribed.  If you develop nausea and vomiting that is not controlled by your nausea medication, call the clinic.   BELOW ARE SYMPTOMS THAT SHOULD BE REPORTED IMMEDIATELY:  *FEVER GREATER THAN 100.5 F  *CHILLS WITH OR WITHOUT FEVER  NAUSEA AND VOMITING THAT IS NOT CONTROLLED WITH YOUR NAUSEA MEDICATION  *UNUSUAL SHORTNESS OF BREATH  *UNUSUAL BRUISING OR BLEEDING  TENDERNESS IN MOUTH AND THROAT WITH OR WITHOUT PRESENCE OF ULCERS  *URINARY PROBLEMS  *BOWEL PROBLEMS  UNUSUAL RASH Items with * indicate a potential emergency and should be followed up as soon as possible.  Feel free to call the clinic should you have any questions or concerns. The clinic phone number is [phone].  Please show the [facility] at check-in to the Emergency Department and triage nurse.

## 2019-07-29 NOTE — Telephone Encounter (Signed)
Scheduled per los. Given printout in infusion

## 2019-08-01 ENCOUNTER — Ambulatory Visit (HOSPITAL_COMMUNITY)
Admission: RE | Admit: 2019-08-01 | Discharge: 2019-08-01 | Disposition: A | Discharge status: Home / Self Care | Payer: Medicare Other | Source: Ambulatory Visit | Attending: Vascular Surgery | Admitting: Vascular Surgery

## 2019-08-01 ENCOUNTER — Encounter: Payer: Self-pay | Admitting: Vascular Surgery

## 2019-08-01 ENCOUNTER — Ambulatory Visit (INDEPENDENT_AMBULATORY_CARE_PROVIDER_SITE_OTHER): Payer: Medicare Other | Admitting: Vascular Surgery

## 2019-08-01 ENCOUNTER — Other Ambulatory Visit: Payer: Self-pay

## 2019-08-01 VITALS — BP 132/74 | HR 66 | Temp 97.8°F | Resp 14 | Ht 66.0 in | Wt 184.0 lb

## 2019-08-01 DIAGNOSIS — I739 Peripheral vascular disease, unspecified: Secondary | ICD-10-CM | POA: Diagnosis not present

## 2019-08-01 MED ORDER — ASPIRIN EC 81 MG PO TBEC: 81.0000 mg | DELAYED_RELEASE_TABLET | Freq: Every day | ORAL | 2 refills | Status: AC

## 2019-08-01 MED ORDER — ROSUVASTATIN CALCIUM 10 MG PO TABS: 10.0000 mg | ORAL_TABLET | Freq: Every day | ORAL | 12 refills | Status: DC

## 2019-08-01 NOTE — Progress Notes (Signed)
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  The CT scan was done in relationship to her ongoing oncology therapy for metastatic lung cancer.  She has a lung primary with a right parietal metastasis and has been undergoing treatment for the last 5 years.  She currently does not really have any symptoms related to her cancer other than fatigue.  She does not describe any claudication symptoms.  She does not have rest pain.  She does not have any nonhealing wounds on her foot.  She has no hip or buttock claudication.  She quit smoking in 2015.  She does not know her cholesterol status.  She is not currently on a statin or aspirin.  Other medical problems include history of cervical cancer and lung cancer as mentioned above.  The patient did have a colectomy in the past for diverticulitis.  All of these problems are currently stable.  Past Medical History:  Diagnosis Date  . Anxiety   . Anxiety 06/20/2016  . Cervical cancer ([place])   . Encounter for antineoplastic chemotherapy 07/20/2015  . 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 ABDOMINAL HYSTERECTOMY    . COLOSTOMY TAKEDOWN N/A 07/10/2014   Procedure: LAPAROSCOPIC LYSIS OF ADHESIONS (90 MIN) LAPAROSCOPIC ASSISTED COLOSTOMY CLOSURE, RIGID PROCTOSIGMOIDOSCOPY;  Surgeon: Jackolyn Confer, MD;  Location: [facility];  Service: General;  Laterality: N/A;  . LAPAROTOMY N/A 11/03/2013   Procedure: EXPLORATORY LAPAROTOMY, DRAINAGE OF INTRA  ABDOMINAL ABSCESSES, MOBILIZATION OF SPLENIC FLEXURE, SIGMOID COLECTOMY WITH COLOSTOMY;  Surgeon: Odis Hollingshead, MD;  Location: [facility];   Service: General;  Laterality: N/A;  . VIDEO BRONCHOSCOPY Bilateral 08/30/2013   Procedure: VIDEO BRONCHOSCOPY WITH FLUORO;  Surgeon: Tanda Rockers, MD;  Location: [facility];  Service: Cardiopulmonary;  Laterality: Bilateral;    Family History  Problem Relation Age of Onset  . Emphysema Father        smoked  . Lung cancer Father        smoked  . Cancer Mother   . Hypertension Mother   . COPD Mother     SOCIAL HISTORY: Social History   Socioeconomic History  . Marital status: Married    Spouse name: Not on file  . Number of children: Not on file  . Years of education: Not on file  . Highest education level: Not on file  Occupational History  . 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 Financial resource strain: Not on file  . Food insecurity    Worry: Not on file    Inability: Not on file  . Transportation needs    Medical: Not on file    Non-medical: Not on file  Tobacco Use  . Smoking status: Former Smoker    Packs/day: 1.00    Years: 40.00    Pack years: 40.00    Types: Cigarettes    Quit date: 09/27/2013    Years since quitting: 5.8  . Smokeless tobacco: Never Used  Substance and Sexual Activity  . Alcohol use: No  . Drug use: No  .  Sexual activity: Yes  Lifestyle  . Physical activity    Days per week: Not on file    Minutes per session: Not on file  . Stress: Not on file  Relationships  . Social Herbalist on phone: Not on file    Gets together: Not on file    Attends religious service: Not on file    Active member of club or organization: Not on file    Attends meetings of clubs or organizations: Not on file    Relationship status: Not on file  . Intimate partner violence    Fear of current or ex partner: Not on file    Emotionally abused: Not on file    Physically abused: Not on file    Forced sexual activity: Not on file  Other Topics Concern  . Not on file  Social History Narrative  . Not on file     Allergies  Allergen Reactions  . Codeine Nausea And Vomiting    Current Outpatient Medications  Medication Sig Dispense Refill  . acetaminophen (TYLENOL) 500 MG tablet Take 500 mg by mouth every 6 (six) hours as needed for mild pain or headache. Reported on 11/02/2015    . ALPRAZolam (XANAX) 0.25 MG tablet Take 1 tablet (0.25 mg total) by mouth 3 (three) times daily as needed. 30 tablet 0  . Ascorbic Acid (VITAMIN C GUMMIE PO) Take 1 each by mouth every morning.    . [employer] ACID CONTROLLER 10 MG tablet SMARTSIG:1 Tablet(s) By Mouth Every 12 Hours PRN    . dexamethasone (DECADRON) 4 MG tablet Take 1 pill twice a day the day before, the day of, and the day after chemotherapy. 40 tablet 2  . folic acid (FOLVITE) 1 MG tablet TAKE 1 TABLET BY MOUTH EVERY DAY 90 tablet 0  . HYDROcodone-acetaminophen (NORCO/VICODIN) 5-325 MG tablet TAKE 1 TABLET BY MOUTH 3 TIMES A DAY FOR 5 DAYS AS NEEDED FOR PAIN    . Multiple Vitamin (MULTIVITAMIN WITH MINERALS) TABS tablet Take 1 tablet by mouth every morning.    . ondansetron (ZOFRAN) 8 MG tablet Take one tab po before chemo. 30 tablet 0  . OVER THE COUNTER MEDICATION Take 1 tablet by mouth every morning. (Vitamin A)    . prochlorperazine (COMPAZINE) 10 MG tablet Take 1 tablet (10 mg total) by mouth every 6 (six) hours as needed for nausea or vomiting. 60 tablet 0  . senna-docusate (SENOKOT-S) 8.6-50 MG tablet Take 1 tablet by mouth daily. 30 tablet prn  . valACYclovir (VALTREX) 1000 MG tablet Take 1,000 mg by mouth 3 (three) times daily.     No current facility-administered medications for this visit.     ROS:   General:  No weight loss, Fever, chills  HEENT: No recent headaches, no nasal bleeding, no visual changes, no sore throat  Neurologic: No dizziness, blackouts, seizures. No recent symptoms of stroke or mini- stroke. No recent episodes of slurred speech, or temporary blindness.  Cardiac: No recent episodes of chest pain/pressure, no shortness of  breath at rest.  No shortness of breath with exertion.  Denies history of atrial fibrillation or irregular heartbeat  Vascular: No history of rest pain in feet.  No history of claudication.  No history of non-healing ulcer, No history of DVT   Pulmonary: No home oxygen, no productive cough, no hemoptysis,  No asthma or wheezing  Musculoskeletal:  [ ]  Arthritis, [ ]  Low back pain,  [ ]  Joint pain  Hematologic:No history of hypercoagulable state.  No history of easy bleeding.  No history of anemia  Gastrointestinal: No hematochezia or melena,  No gastroesophageal reflux, no trouble swallowing  Urinary: [ ]  chronic Kidney disease, [ ]  on HD - [ ]  MWF or [ ]  TTHS, [ ]  Burning with urination, [ ]  Frequent urination, [ ]  Difficulty urinating;   Skin: No rashes  Psychological: No history of anxiety,  No history of depression   Physical Examination  Vitals:   08/01/19 1307  BP: 132/74  Pulse: 66  Resp: 14  Temp: 97.8 F (36.6 C)  TempSrc: Temporal  SpO2: 98%  Weight: 184 lb (83.5 kg)  Height: 5\' 6"  (1.676 m)    Body mass index is 29.7 kg/m.  General:  Alert and oriented, no acute distress HEENT: Normal Neck: No JVD Cardiac: Regular Rate and Rhythm Abdomen: Soft, non-tender, non-distended, no mass, no scars Skin: No rash Extremity Pulses:  2+ radial, brachial, absent right 2+ left femoral, absent right 2+ left dorsalis pedis, posterior tibial pulses  Musculoskeletal: No deformity or edema  Neurologic: Upper and lower extremity motor 5/5 and symmetric  DATA:  I reviewed the patient's CT scan from June 13, 2019.  This showed a subtotal occlusion over a short segment of the right external iliac artery near its origin.  This has developed in an area of a previously heavily calcified lesion.  The short segment occlusion was also present on her CT scan in July 2020.  I reviewed the images of the CT scans dating back all the way to 2018 today.  Patient had bilateral ABIs  performed today.  I reviewed and interpreted the study.  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   PLAN: We will continue to follow the patient to make sure that she has adequate perfusion to her right lower extremity.  She will call us if she develops any symptoms of claudication or rest pain at that point we would consider an intervention.    Otherwise today I have placed her on aspirin and a statin for long-term management.  She will follow-up both with [place] in 1 year with repeat ABIs.   Ruta Hinds, MD [facility] Office: [phone] Pager: [phone]

## 2019-08-19 ENCOUNTER — Encounter: Payer: Self-pay | Admitting: Internal Medicine

## 2019-08-19 ENCOUNTER — Encounter: Payer: Self-pay | Admitting: General Practice

## 2019-08-19 ENCOUNTER — Inpatient Hospital Stay: Payer: Medicare Other

## 2019-08-19 ENCOUNTER — Inpatient Hospital Stay: Payer: Medicare Other | Attending: Internal Medicine | Admitting: Internal Medicine

## 2019-08-19 ENCOUNTER — Other Ambulatory Visit: Payer: Self-pay

## 2019-08-19 VITALS — BP 133/71 | HR 100 | Temp 98.3°F | Resp 20 | Ht 66.0 in | Wt 182.2 lb

## 2019-08-19 DIAGNOSIS — Z79899 Other long term (current) drug therapy: Secondary | ICD-10-CM | POA: Diagnosis not present

## 2019-08-19 DIAGNOSIS — F419 Anxiety disorder, unspecified: Secondary | ICD-10-CM | POA: Insufficient documentation

## 2019-08-19 DIAGNOSIS — C3490 Malignant neoplasm of unspecified part of unspecified bronchus or lung: Secondary | ICD-10-CM

## 2019-08-19 DIAGNOSIS — Z923 Personal history of irradiation: Secondary | ICD-10-CM | POA: Diagnosis not present

## 2019-08-19 DIAGNOSIS — C3411 Malignant neoplasm of upper lobe, right bronchus or lung: Secondary | ICD-10-CM | POA: Insufficient documentation

## 2019-08-19 DIAGNOSIS — C7931 Secondary malignant neoplasm of brain: Secondary | ICD-10-CM | POA: Insufficient documentation

## 2019-08-19 DIAGNOSIS — Z5111 Encounter for antineoplastic chemotherapy: Secondary | ICD-10-CM

## 2019-08-19 DIAGNOSIS — Z9221 Personal history of antineoplastic chemotherapy: Secondary | ICD-10-CM | POA: Insufficient documentation

## 2019-08-19 DIAGNOSIS — Z5112 Encounter for antineoplastic immunotherapy: Secondary | ICD-10-CM | POA: Diagnosis not present

## 2019-08-19 LAB — CBC WITH DIFFERENTIAL (CANCER CENTER ONLY)
Abs Immature Granulocytes: 0.09 10*3/uL — ABNORMAL HIGH (ref 0.00–0.07)
Basophils Absolute: 0 10*3/uL (ref 0.0–0.1)
Basophils Relative: 0 %
Eosinophils Absolute: 0 10*3/uL (ref 0.0–0.5)
Eosinophils Relative: 0 %
HCT: 39.6 % (ref 36.0–46.0)
Hemoglobin: 12.6 g/dL (ref 12.0–15.0)
Immature Granulocytes: 1 %
Lymphocytes Relative: 4 %
Lymphs Abs: 0.4 10*3/uL — ABNORMAL LOW (ref 0.7–4.0)
MCH: 32.2 pg (ref 26.0–34.0)
MCHC: 31.8 g/dL (ref 30.0–36.0)
MCV: 101.3 fL — ABNORMAL HIGH (ref 80.0–100.0)
Monocytes Absolute: 0.4 10*3/uL (ref 0.1–1.0)
Monocytes Relative: 4 %
Neutro Abs: 10.3 10*3/uL — ABNORMAL HIGH (ref 1.7–7.7)
Neutrophils Relative %: 91 %
Platelet Count: 306 10*3/uL (ref 150–400)
RBC: 3.91 MIL/uL (ref 3.87–5.11)
RDW: 15.3 % (ref 11.5–15.5)
WBC Count: 11.2 10*3/uL — ABNORMAL HIGH (ref 4.0–10.5)
nRBC: 0 % (ref 0.0–0.2)

## 2019-08-19 LAB — CMP (CANCER CENTER ONLY)
ALT: 11 U/L (ref 0–44)
AST: 12 U/L — ABNORMAL LOW (ref 15–41)
Albumin: 3.5 g/dL (ref 3.5–5.0)
Alkaline Phosphatase: 88 U/L (ref 38–126)
Anion gap: 12 (ref 5–15)
BUN: 10 mg/dL (ref 8–23)
CO2: 23 mmol/L (ref 22–32)
Calcium: 9.5 mg/dL (ref 8.9–10.3)
Chloride: 104 mmol/L (ref 98–111)
Creatinine: 0.79 mg/dL (ref 0.44–1.00)
GFR, Est AFR Am: >60 mL/min (ref >60)
GFR, Estimated: >60 mL/min (ref >60)
Glucose, Bld: 199 mg/dL — ABNORMAL HIGH (ref 70–99)
Potassium: 4.4 mmol/L (ref 3.5–5.1)
Sodium: 139 mmol/L (ref 135–145)
Total Bilirubin: 0.5 mg/dL (ref 0.3–1.2)
Total Protein: 7 g/dL (ref 6.5–8.1)

## 2019-08-19 MED ORDER — SODIUM CHLORIDE 0.9 % IV SOLN
Freq: Once | INTRAVENOUS | Status: AC
  Filled 2019-08-19: qty 250

## 2019-08-19 MED ORDER — DEXAMETHASONE SODIUM PHOSPHATE 10 MG/ML IJ SOLN
10.0000 mg | Freq: Once | INTRAMUSCULAR | Status: AC
  Administered 2019-08-19: 10 mg via INTRAVENOUS

## 2019-08-19 MED ORDER — SODIUM CHLORIDE 0.9 % IV SOLN
500.0000 mg/m2 | Freq: Once | INTRAVENOUS | Status: AC
  Administered 2019-08-19: 900 mg via INTRAVENOUS
  Filled 2019-08-19: qty 16

## 2019-08-19 MED ORDER — DEXAMETHASONE SODIUM PHOSPHATE 10 MG/ML IJ SOLN
INTRAMUSCULAR | Status: AC
  Filled 2019-08-19: qty 1

## 2019-08-19 NOTE — Progress Notes (Signed)
[facility] Spiritual Care Note  Referred by Willodean Rosenthal in infusion for bereavement support, as Ms Lindy's mother died on 08-04-2023. Ms Bodkins, who goes by "Posey Pronto," was very welcoming of pastoral presence. Provided handknit prayer shawl and bone pillow as tangible signs of comfort and care, as well as empathic listening, normalization of feelings, pastoral reflection, and grief support and education. We connected readily, and Posey Pronto knows to contact chaplain whenever needed/desired. We also plan to follow up at future treatment.   [place], [place], [place] Pager [phone] Voicemail [phone]

## 2019-08-19 NOTE — Patient Instructions (Signed)
[facility] Discharge Instructions for Patients Receiving Chemotherapy  Today you received the following chemotherapy agents: Pemetrexed (Alimta)  To help prevent nausea and vomiting after your treatment, we encourage you to take your nausea medication as directed.   If you develop nausea and vomiting that is not controlled by your nausea medication, call the clinic.   BELOW ARE SYMPTOMS THAT SHOULD BE REPORTED IMMEDIATELY:  *FEVER GREATER THAN 100.5 F  *CHILLS WITH OR WITHOUT FEVER  NAUSEA AND VOMITING THAT IS NOT CONTROLLED WITH YOUR NAUSEA MEDICATION  *UNUSUAL SHORTNESS OF BREATH  *UNUSUAL BRUISING OR BLEEDING  TENDERNESS IN MOUTH AND THROAT WITH OR WITHOUT PRESENCE OF ULCERS  *URINARY PROBLEMS  *BOWEL PROBLEMS  UNUSUAL RASH Items with * indicate a potential emergency and should be followed up as soon as possible.  Feel free to call the clinic should you have any questions or concerns. The clinic phone number is [phone].  Please show the [facility] at check-in to the Emergency Department and triage nurse.  Coronavirus (COVID-19) Are you at risk?  Are you at risk for the Coronavirus (COVID-19)?  To be considered HIGH RISK for Coronavirus (COVID-19), you have to meet the following criteria:  . Traveled to [place], [place], [place], [place] or [place]; or in the [place] to [place], [place], [place], or [place]; and have fever, cough, and shortness of breath within the last 2 weeks of travel OR . Been in close contact with a person diagnosed with COVID-19 within the last 2 weeks and have fever, cough, and shortness of breath . IF YOU DO NOT MEET THESE CRITERIA, YOU ARE CONSIDERED LOW RISK FOR COVID-19.  What to do if you are HIGH RISK for COVID-19?  Marland Kitchen If you are having a medical emergency, call 911. . Seek medical care right away. Before you go to a doctor's office, urgent care or emergency department, call ahead and tell them about  your recent travel, contact with someone diagnosed with COVID-19, and your symptoms. You should receive instructions from your physician's office regarding next steps of care.  . When you arrive at healthcare provider, tell the healthcare staff immediately you have returned from visiting [place], [place], [place], [place] or [place]; or traveled in the [place] to [place], [place], [place], or [place]; in the last two weeks or you have been in close contact with a person diagnosed with COVID-19 in the last 2 weeks.   . Tell the health care staff about your symptoms: fever, cough and shortness of breath. . After you have been seen by a medical provider, you will be either: o Tested for (COVID-19) and discharged home on quarantine except to seek medical care if symptoms worsen, and asked to  - Stay home and avoid contact with others until you get your results (4-5 days)  - Avoid travel on public transportation if possible (such as bus, train, or airplane) or o Sent to the Emergency Department by EMS for evaluation, COVID-19 testing, and possible admission depending on your condition and test results.  What to do if you are LOW RISK for COVID-19?  Reduce your risk of any infection by using the same precautions used for avoiding the common cold or flu:  Marland Kitchen Wash your hands often with soap and warm water for at least 20 seconds.  If soap and water are not readily available, use an alcohol-based hand sanitizer with at least 60% alcohol.  . If coughing or  sneezing, cover your mouth and nose by coughing or sneezing into the elbow areas of your shirt or coat, into a tissue or into your sleeve (not your hands). . Avoid shaking hands with others and consider head nods or verbal greetings only. . Avoid touching your eyes, nose, or mouth with unwashed hands.  . Avoid close contact with people who are sick. . Avoid places or events with large numbers of people in one location, like concerts or sporting  events. . Carefully consider travel plans you have or are making. . If you are planning any travel outside or inside the [place], visit the CDC's Travelers' Health webpage for the latest health notices. . If you have some symptoms but not all symptoms, continue to monitor at home and seek medical attention if your symptoms worsen. . If you are having a medical emergency, call 911.   [facility] / e-Visit: [email or web address]         [facility]: [facility]: [phone]                   [facility]: [phone]

## 2019-08-19 NOTE — Progress Notes (Signed)
[facility] [phone]   [phone]  OFFICE PROGRESS NOTE  [name], MD [address] [place] [address]  DIAGNOSIS: Stage IV (T2a, N0, M1b) non-small cell lung cancer, adenocarcinoma with negative EGFR and ALK mutations diagnosed in January 2015 and presented with right upper lobe lung mass in addition to a solitary brain metastasis.  PRIOR THERAPY: 1) Status post stereotactic radiotherapy to a solitary right parietal brain lesion under the care of Dr. [name] on 10/16/2013. 2) Status post palliative radiotherapy to the right lung tumor under the care of Dr. [name] completed on 12/05/2013. 3) Systemic chemotherapy with carboplatin for AUC of 5 and Alimta 500 mg/M2 every 3 weeks. First dose Jan 06 2014. Status post 6 cycles.  CURRENT THERAPY: Systemic chemotherapy with maintenance Alimta 500 MG/M2 every 3 weeks, status post 90 cycles.  INTERVAL HISTORY: Alexis [name] 68 y.o. female returns to the clinic today for follow-up visit.  The patient is feeling fine today with no concerning complaints.  She unfortunately lost her mother few weeks ago.  She denied having any current chest pain, shortness of breath, cough or hemoptysis.  She denied having any fever or chills.  She has no nausea, vomiting, diarrhea or constipation.  She has no headache or visual changes.  She continues to tolerate her treatment with maintenance Alimta fairly well.  The patient is here today for evaluation before starting cycle #91.  MEDICAL HISTORY: Past Medical History:  Diagnosis Date  . Anxiety   . Anxiety 06/20/2016  . Cervical cancer ([facility])   . Encounter for antineoplastic chemotherapy 07/20/2015  . 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  MEDICATIONS:  Current Outpatient Medications  Medication Sig Dispense Refill  . acetaminophen (TYLENOL) 500 MG  tablet Take 500 mg by mouth every 6 (six) hours as needed for mild pain or headache. Reported on 11/02/2015    . ALPRAZolam (XANAX) 0.25 MG tablet Take 1 tablet (0.25 mg total) by mouth 3 (three) times daily as needed. 30 tablet 0  . Ascorbic Acid (VITAMIN C GUMMIE PO) Take 1 each by mouth every morning.    Marland Kitchen aspirin EC 81 MG tablet Take 1 tablet (81 mg total) by mouth daily. 150 tablet 2  . [employer] ACID CONTROLLER 10 MG tablet SMARTSIG:1 Tablet(s) By Mouth Every 12 Hours PRN    . dexamethasone (DECADRON) 4 MG tablet Take 1 pill twice a day the day before, the day of, and the day after chemotherapy. 40 tablet 2  . folic acid (FOLVITE) 1 MG tablet TAKE 1 TABLET BY MOUTH EVERY DAY 90 tablet 0  . HYDROcodone-acetaminophen (NORCO/VICODIN) 5-325 MG tablet TAKE 1 TABLET BY MOUTH 3 TIMES A DAY FOR 5 DAYS AS NEEDED FOR PAIN    . Multiple Vitamin (MULTIVITAMIN WITH MINERALS) TABS tablet Take 1 tablet by mouth every morning.    . ondansetron (ZOFRAN) 8 MG tablet Take one tab po before chemo. 30 tablet 0  . OVER THE COUNTER MEDICATION Take 1 tablet by mouth every morning. (Vitamin A)    . prochlorperazine (COMPAZINE) 10 MG tablet Take 1 tablet (10 mg total) by mouth every 6 (six) hours as needed for nausea or vomiting. 60 tablet 0  . rosuvastatin (CRESTOR) 10 MG tablet Take 1 tablet (10 mg total) by mouth daily. 30 tablet 12  .  senna-docusate (SENOKOT-S) 8.6-50 MG tablet Take 1 tablet by mouth daily. 30 tablet prn  . valACYclovir (VALTREX) 1000 MG tablet Take 1,000 mg by mouth 3 (three) times daily.     No current facility-administered medications for this visit.    SURGICAL HISTORY:  Past Surgical History:  Procedure Laterality Date  . ABDOMINAL HYSTERECTOMY    . COLOSTOMY TAKEDOWN N/A 07/10/2014   Procedure: LAPAROSCOPIC LYSIS OF ADHESIONS (90 MIN) LAPAROSCOPIC ASSISTED COLOSTOMY CLOSURE, RIGID PROCTOSIGMOIDOSCOPY;  Surgeon: Jackolyn Confer, MD;  Location: [facility];  Service: General;  Laterality: N/A;  .  LAPAROTOMY N/A 11/03/2013   Procedure: EXPLORATORY LAPAROTOMY, DRAINAGE OF INTRA  ABDOMINAL ABSCESSES, MOBILIZATION OF SPLENIC FLEXURE, SIGMOID COLECTOMY WITH COLOSTOMY;  Surgeon: Odis Hollingshead, MD;  Location: [facility];  Service: General;  Laterality: N/A;  . VIDEO BRONCHOSCOPY Bilateral 08/30/2013   Procedure: VIDEO BRONCHOSCOPY WITH FLUORO;  Surgeon: Tanda Rockers, MD;  Location: [facility];  Service: Cardiopulmonary;  Laterality: Bilateral;    REVIEW OF SYSTEMS:  A comprehensive review of systems was negative except for: Constitutional: positive for fatigue   PHYSICAL EXAMINATION: General appearance: alert, cooperative and no distress Head: Normocephalic, without obvious abnormality, atraumatic Neck: no adenopathy, no JVD, supple, symmetrical, trachea midline and thyroid not enlarged, symmetric, no tenderness/mass/nodules Lymph nodes: Cervical, supraclavicular, and axillary nodes normal. Resp: clear to auscultation bilaterally Back: symmetric, no curvature. ROM normal. No CVA tenderness. Cardio: regular rate and rhythm, S1, S2 normal, no murmur, click, rub or gallop GI: soft, non-tender; bowel sounds normal; no masses,  no organomegaly Extremities: extremities normal, atraumatic, no cyanosis or edema   ECOG PERFORMANCE STATUS: 1 - Symptomatic but completely ambulatory  Blood pressure 133/71, pulse 100, temperature 98.3 F (36.8 C), temperature source Temporal, resp. rate 20, height 5' 6"  (1.676 m), weight 182 lb 3.2 oz (82.6 kg), SpO2 99 %.  LABORATORY DATA: Lab Results  Component Value Date   WBC 11.2 (H) 08/19/2019   HGB 12.6 08/19/2019   HCT 39.6 08/19/2019   MCV 101.3 (H) 08/19/2019   PLT 306 08/19/2019      Chemistry      Component Value Date/Time   NA 139 08/19/2019 0903   NA 139 08/14/2017 0837   K 4.4 08/19/2019 0903   K 4.4 08/14/2017 0837   CL 104 08/19/2019 0903   CO2 23 08/19/2019 0903   CO2 24 08/14/2017 0837   BUN 10 08/19/2019 0903   BUN 13.3 08/14/2017  0837   CREATININE 0.79 08/19/2019 0903   CREATININE 0.8 08/14/2017 0837      Component Value Date/Time   CALCIUM 9.5 08/19/2019 0903   CALCIUM 9.3 08/14/2017 0837   ALKPHOS 88 08/19/2019 0903   ALKPHOS 107 08/14/2017 0837   AST 12 (L) 08/19/2019 0903   AST 12 08/14/2017 0837   ALT 11 08/19/2019 0903   ALT 12 08/14/2017 0837   BILITOT 0.5 08/19/2019 0903   BILITOT 0.37 08/14/2017 0837       RADIOGRAPHIC STUDIES: ABI  Result Date: 08/01/2019 LOWER EXTREMITY DOPPLER STUDY  Comparison Study: None Performing Technologist: Kathrine Comfort RVT, RDCS  Examination Guidelines: A complete evaluation includes at minimum, Doppler waveform signals and systolic blood pressure reading at the level of bilateral brachial, anterior tibial, and posterior tibial arteries, when vessel segments are accessible. Bilateral testing is considered an integral part of a complete examination. Photoelectric Plethysmograph (PPG) waveforms and toe systolic pressure readings are included as required and additional duplex testing as needed. Limited examinations for reoccurring indications may  be performed as noted.  ABI Findings: +---------+------------------+-----+----------+--------+ Right    Rt Pressure (mmHg)IndexWaveform  Comment  +---------+------------------+-----+----------+--------+ Brachial 116                                       +---------+------------------+-----+----------+--------+ PTA      68                0.59 monophasic         +---------+------------------+-----+----------+--------+ DP       69                0.59 monophasic         +---------+------------------+-----+----------+--------+ Great Toe51                0.44                    +---------+------------------+-----+----------+--------+ +---------+------------------+-----+---------+-------+ Left     Lt Pressure (mmHg)IndexWaveform Comment +---------+------------------+-----+---------+-------+ Brachial 115                                      +---------+------------------+-----+---------+-------+ PTA      116               1.00 triphasic        +---------+------------------+-----+---------+-------+ DP       117               1.01 triphasic        +---------+------------------+-----+---------+-------+ Great Toe69                0.59 Abnormal         +---------+------------------+-----+---------+-------+ No previous ABI  Summary: Right: Resting right ankle-brachial index indicates moderate right lower extremity arterial disease. The right toe-brachial index is abnormal. Left: Resting left ankle-brachial index is within normal range. No evidence of significant left lower extremity arterial disease. The left toe-brachial index is abnormal.  *See table(s) above for measurements and observations.  Electronically signed by Ruta Hinds MD on 08/01/2019 at 2:19:19 PM.   Final     ASSESSMENT AND PLAN:  This is a very pleasant 68 years old white female with metastatic non-small cell lung cancer, adenocarcinoma status post induction systemic chemotherapy with carboplatin and Alimta with partial response. The patient is currently on maintenance treatment with single agent Alimta status post 90 cycles. The patient has been tolerating this treatment well with no concerning adverse effects. I recommended for her to proceed with cycle #91 today as planned. I will see her back for follow-up visit in 3 weeks for evaluation with repeat CT scan of the chest, abdomen pelvis for restaging of her disease. For the prefer arterial disease, she is followed by vascular surgery and the patient was recently started on aspirin and Crestor. She was advised to call immediately if she has any concerning symptoms in the interval. The patient voices understanding of current disease status and treatment options and is in agreement with the current care plan. All questions were answered. The patient knows to call the clinic with  any problems, questions or concerns. We can certainly see the patient much sooner if necessary.  Disclaimer: This note was dictated with voice recognition software. Similar sounding words can inadvertently be transcribed and may not be corrected upon review.

## 2019-08-26 ENCOUNTER — Other Ambulatory Visit: Payer: Self-pay | Admitting: *Deleted

## 2019-08-26 DIAGNOSIS — I739 Peripheral vascular disease, unspecified: Secondary | ICD-10-CM

## 2019-08-27 ENCOUNTER — Other Ambulatory Visit: Payer: Self-pay | Admitting: Internal Medicine

## 2019-08-27 DIAGNOSIS — C3490 Malignant neoplasm of unspecified part of unspecified bronchus or lung: Secondary | ICD-10-CM

## 2019-09-03 DIAGNOSIS — F419 Anxiety disorder, unspecified: Secondary | ICD-10-CM | POA: Diagnosis not present

## 2019-09-03 DIAGNOSIS — E782 Mixed hyperlipidemia: Secondary | ICD-10-CM | POA: Diagnosis not present

## 2019-09-03 DIAGNOSIS — Z0000 Encounter for general adult medical examination without abnormal findings: Secondary | ICD-10-CM | POA: Diagnosis not present

## 2019-09-03 DIAGNOSIS — I10 Essential (primary) hypertension: Secondary | ICD-10-CM | POA: Diagnosis not present

## 2019-09-03 DIAGNOSIS — K219 Gastro-esophageal reflux disease without esophagitis: Secondary | ICD-10-CM | POA: Diagnosis not present

## 2019-09-03 DIAGNOSIS — I745 Embolism and thrombosis of iliac artery: Secondary | ICD-10-CM | POA: Diagnosis not present

## 2019-09-06 ENCOUNTER — Other Ambulatory Visit: Payer: Self-pay

## 2019-09-06 ENCOUNTER — Ambulatory Visit
Admission: RE | Admit: 2019-09-06 | Discharge: 2019-09-06 | Disposition: A | Discharge status: Home / Self Care | Payer: Medicare Other | Source: Ambulatory Visit | Attending: Internal Medicine | Admitting: Internal Medicine

## 2019-09-06 DIAGNOSIS — J432 Centrilobular emphysema: Secondary | ICD-10-CM | POA: Diagnosis not present

## 2019-09-06 DIAGNOSIS — I745 Embolism and thrombosis of iliac artery: Secondary | ICD-10-CM | POA: Diagnosis not present

## 2019-09-06 DIAGNOSIS — Z85118 Personal history of other malignant neoplasm of bronchus and lung: Secondary | ICD-10-CM | POA: Diagnosis not present

## 2019-09-06 DIAGNOSIS — C3490 Malignant neoplasm of unspecified part of unspecified bronchus or lung: Secondary | ICD-10-CM

## 2019-09-06 MED ORDER — IOPAMIDOL (ISOVUE-300) INJECTION 61%
100.0000 mL | Freq: Once | INTRAVENOUS | Status: AC | PRN
  Administered 2019-09-06: 11:00:00 100 mL via INTRAVENOUS

## 2019-09-09 ENCOUNTER — Encounter: Payer: Self-pay | Admitting: General Practice

## 2019-09-09 ENCOUNTER — Other Ambulatory Visit: Payer: Self-pay

## 2019-09-09 ENCOUNTER — Encounter: Payer: Self-pay | Admitting: Internal Medicine

## 2019-09-09 ENCOUNTER — Inpatient Hospital Stay: Payer: Medicare Other | Attending: Internal Medicine

## 2019-09-09 ENCOUNTER — Inpatient Hospital Stay (HOSPITAL_BASED_OUTPATIENT_CLINIC_OR_DEPARTMENT_OTHER): Payer: Medicare Other | Admitting: Internal Medicine

## 2019-09-09 ENCOUNTER — Inpatient Hospital Stay: Payer: Medicare Other

## 2019-09-09 VITALS — HR 86

## 2019-09-09 VITALS — BP 159/70 | HR 111 | Temp 98.5°F | Resp 20 | Ht 66.0 in | Wt 180.6 lb

## 2019-09-09 DIAGNOSIS — C7931 Secondary malignant neoplasm of brain: Secondary | ICD-10-CM

## 2019-09-09 DIAGNOSIS — C3411 Malignant neoplasm of upper lobe, right bronchus or lung: Secondary | ICD-10-CM

## 2019-09-09 DIAGNOSIS — Z923 Personal history of irradiation: Secondary | ICD-10-CM | POA: Diagnosis not present

## 2019-09-09 DIAGNOSIS — Z5111 Encounter for antineoplastic chemotherapy: Secondary | ICD-10-CM

## 2019-09-09 DIAGNOSIS — Z5112 Encounter for antineoplastic immunotherapy: Secondary | ICD-10-CM | POA: Insufficient documentation

## 2019-09-09 DIAGNOSIS — F419 Anxiety disorder, unspecified: Secondary | ICD-10-CM

## 2019-09-09 DIAGNOSIS — C7949 Secondary malignant neoplasm of other parts of nervous system: Secondary | ICD-10-CM

## 2019-09-09 DIAGNOSIS — Z79899 Other long term (current) drug therapy: Secondary | ICD-10-CM | POA: Insufficient documentation

## 2019-09-09 DIAGNOSIS — Z9221 Personal history of antineoplastic chemotherapy: Secondary | ICD-10-CM | POA: Diagnosis not present

## 2019-09-09 LAB — CBC WITH DIFFERENTIAL (CANCER CENTER ONLY)
Abs Immature Granulocytes: 0.04 10*3/uL (ref 0.00–0.07)
Basophils Absolute: 0 10*3/uL (ref 0.0–0.1)
Basophils Relative: 0 %
Eosinophils Absolute: 0 10*3/uL (ref 0.0–0.5)
Eosinophils Relative: 0 %
HCT: 38.6 % (ref 36.0–46.0)
Hemoglobin: 12.3 g/dL (ref 12.0–15.0)
Immature Granulocytes: 1 %
Lymphocytes Relative: 6 %
Lymphs Abs: 0.5 10*3/uL — ABNORMAL LOW (ref 0.7–4.0)
MCH: 32.3 pg (ref 26.0–34.0)
MCHC: 31.9 g/dL (ref 30.0–36.0)
MCV: 101.3 fL — ABNORMAL HIGH (ref 80.0–100.0)
Monocytes Absolute: 0.7 10*3/uL (ref 0.1–1.0)
Monocytes Relative: 9 %
Neutro Abs: 7 10*3/uL (ref 1.7–7.7)
Neutrophils Relative %: 84 %
Platelet Count: 342 10*3/uL (ref 150–400)
RBC: 3.81 MIL/uL — ABNORMAL LOW (ref 3.87–5.11)
RDW: 14.6 % (ref 11.5–15.5)
WBC Count: 8.2 10*3/uL (ref 4.0–10.5)
nRBC: 0 % (ref 0.0–0.2)

## 2019-09-09 LAB — CMP (CANCER CENTER ONLY)
ALT: 13 U/L (ref 0–44)
AST: 11 U/L — ABNORMAL LOW (ref 15–41)
Albumin: 3.5 g/dL (ref 3.5–5.0)
Alkaline Phosphatase: 88 U/L (ref 38–126)
Anion gap: 11 (ref 5–15)
BUN: 12 mg/dL (ref 8–23)
CO2: 24 mmol/L (ref 22–32)
Calcium: 9.3 mg/dL (ref 8.9–10.3)
Chloride: 106 mmol/L (ref 98–111)
Creatinine: 0.76 mg/dL (ref 0.44–1.00)
GFR, Est AFR Am: >60 mL/min (ref >60)
GFR, Estimated: >60 mL/min (ref >60)
Glucose, Bld: 171 mg/dL — ABNORMAL HIGH (ref 70–99)
Potassium: 3.9 mmol/L (ref 3.5–5.1)
Sodium: 141 mmol/L (ref 135–145)
Total Bilirubin: 0.5 mg/dL (ref 0.3–1.2)
Total Protein: 7 g/dL (ref 6.5–8.1)

## 2019-09-09 MED ORDER — DEXAMETHASONE SODIUM PHOSPHATE 10 MG/ML IJ SOLN
INTRAMUSCULAR | Status: AC
  Filled 2019-09-09: qty 1

## 2019-09-09 MED ORDER — DEXAMETHASONE SODIUM PHOSPHATE 10 MG/ML IJ SOLN
10.0000 mg | Freq: Once | INTRAMUSCULAR | Status: AC
  Administered 2019-09-09: 10 mg via INTRAVENOUS

## 2019-09-09 MED ORDER — CYANOCOBALAMIN 1000 MCG/ML IJ SOLN
1000.0000 ug | Freq: Once | INTRAMUSCULAR | Status: AC
  Administered 2019-09-09: 1000 ug via INTRAMUSCULAR
  Filled 2019-09-09: qty 1

## 2019-09-09 MED ORDER — SODIUM CHLORIDE 0.9 % IV SOLN
Freq: Once | INTRAVENOUS | Status: AC
  Filled 2019-09-09: qty 250

## 2019-09-09 MED ORDER — SODIUM CHLORIDE 0.9 % IV SOLN
500.0000 mg/m2 | Freq: Once | INTRAVENOUS | Status: AC
  Administered 2019-09-09: 900 mg via INTRAVENOUS
  Filled 2019-09-09: qty 16

## 2019-09-09 NOTE — Patient Instructions (Signed)
[facility] Discharge Instructions for Patients Receiving Chemotherapy  Today you received the following chemotherapy agents: Alimta   To help prevent nausea and vomiting after your treatment, we encourage you to take your nausea medication as directed.    If you develop nausea and vomiting that is not controlled by your nausea medication, call the clinic.   BELOW ARE SYMPTOMS THAT SHOULD BE REPORTED IMMEDIATELY:  *FEVER GREATER THAN 100.5 F  *CHILLS WITH OR WITHOUT FEVER  NAUSEA AND VOMITING THAT IS NOT CONTROLLED WITH YOUR NAUSEA MEDICATION  *UNUSUAL SHORTNESS OF BREATH  *UNUSUAL BRUISING OR BLEEDING  TENDERNESS IN MOUTH AND THROAT WITH OR WITHOUT PRESENCE OF ULCERS  *URINARY PROBLEMS  *BOWEL PROBLEMS  UNUSUAL RASH Items with * indicate a potential emergency and should be followed up as soon as possible.  Feel free to call the clinic should you have any questions or concerns. The clinic phone number is [phone].  Please show the [facility] at check-in to the Emergency Department and triage nurse.

## 2019-09-09 NOTE — Progress Notes (Signed)
[facility] [phone]   [phone]  OFFICE PROGRESS NOTE  [name], MD [address] [place] [address]  DIAGNOSIS: Stage IV (T2a, N0, M1b) non-small cell lung cancer, adenocarcinoma with negative EGFR and ALK mutations diagnosed in January 2015 and presented with right upper lobe lung mass in addition to a solitary brain metastasis.  PRIOR THERAPY: 1) Status post stereotactic radiotherapy to a solitary right parietal brain lesion under the care of Dr. [name] on 10/16/2013. 2) Status post palliative radiotherapy to the right lung tumor under the care of Dr. [name] completed on 12/05/2013. 3) Systemic chemotherapy with carboplatin for AUC of 5 and Alimta 500 mg/M2 every 3 weeks. First dose Jan 06 2014. Status post 6 cycles.  CURRENT THERAPY: Systemic chemotherapy with maintenance Alimta 500 MG/M2 every 3 weeks, status post 91 cycles.  INTERVAL HISTORY: Alexis [name] 69 y.o. female returns to the clinic today for follow-up visit.  The patient is feeling fine today with no concerning complaints except for stress dealing with some family issues.  She denied having any current chest pain, shortness of breath, cough or hemoptysis.  She denied having any fever or chills.  She has no nausea, vomiting, diarrhea or constipation.  She denied having any headache or visual changes.  She has been tolerating her treatment with maintenance Alimta fairly well.  The patient is here today for evaluation with repeat CT scan of the chest, abdomen and pelvis for restaging of her disease.  MEDICAL HISTORY: Past Medical History:  Diagnosis Date  . Anxiety   . Anxiety 06/20/2016  . Cervical cancer ([place])   . Encounter for antineoplastic chemotherapy 07/20/2015  . 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  MEDICATIONS:  Current Outpatient Medications    Medication Sig Dispense Refill  . acetaminophen (TYLENOL) 500 MG tablet Take 500 mg by mouth every 6 (six) hours as needed for mild pain or headache. Reported on 11/02/2015    . ALPRAZolam (XANAX) 0.25 MG tablet Take 1 tablet (0.25 mg total) by mouth 3 (three) times daily as needed. 30 tablet 0  . Ascorbic Acid (VITAMIN C GUMMIE PO) Take 1 each by mouth every morning.    Marland Kitchen aspirin EC 81 MG tablet Take 1 tablet (81 mg total) by mouth daily. 150 tablet 2  . [employer] ACID CONTROLLER 10 MG tablet SMARTSIG:1 Tablet(s) By Mouth Every 12 Hours PRN    . dexamethasone (DECADRON) 4 MG tablet Take 1 pill twice a day the day before, the day of, and the day after chemotherapy. 40 tablet 2  . folic acid (FOLVITE) 1 MG tablet TAKE 1 TABLET BY MOUTH EVERY DAY 90 tablet 0  . HYDROcodone-acetaminophen (NORCO/VICODIN) 5-325 MG tablet TAKE 1 TABLET BY MOUTH 3 TIMES A DAY FOR 5 DAYS AS NEEDED FOR PAIN    . Multiple Vitamin (MULTIVITAMIN WITH MINERALS) TABS tablet Take 1 tablet by mouth every morning.    . ondansetron (ZOFRAN) 8 MG tablet Take one tab po before chemo. 30 tablet 0  . OVER THE COUNTER MEDICATION Take 1 tablet by mouth every morning. (Vitamin A)    . prochlorperazine (COMPAZINE) 10 MG tablet Take 1 tablet (10 mg total) by mouth every 6 (six) hours as needed for nausea or vomiting. 60 tablet 0  . rosuvastatin (CRESTOR) 10 MG tablet  Take 1 tablet (10 mg total) by mouth daily. 30 tablet 12  . senna-docusate (SENOKOT-S) 8.6-50 MG tablet Take 1 tablet by mouth daily. 30 tablet prn  . valACYclovir (VALTREX) 1000 MG tablet Take 1,000 mg by mouth 3 (three) times daily.     No current facility-administered medications for this visit.    SURGICAL HISTORY:  Past Surgical History:  Procedure Laterality Date  . ABDOMINAL HYSTERECTOMY    . COLOSTOMY TAKEDOWN N/A 07/10/2014   Procedure: LAPAROSCOPIC LYSIS OF ADHESIONS (90 MIN) LAPAROSCOPIC ASSISTED COLOSTOMY CLOSURE, RIGID PROCTOSIGMOIDOSCOPY;  Surgeon: Jackolyn Confer, MD;  Location: [facility];  Service: General;  Laterality: N/A;  . LAPAROTOMY N/A 11/03/2013   Procedure: EXPLORATORY LAPAROTOMY, DRAINAGE OF INTRA  ABDOMINAL ABSCESSES, MOBILIZATION OF SPLENIC FLEXURE, SIGMOID COLECTOMY WITH COLOSTOMY;  Surgeon: Odis Hollingshead, MD;  Location: [facility];  Service: General;  Laterality: N/A;  . VIDEO BRONCHOSCOPY Bilateral 08/30/2013   Procedure: VIDEO BRONCHOSCOPY WITH FLUORO;  Surgeon: Tanda Rockers, MD;  Location: [facility];  Service: Cardiopulmonary;  Laterality: Bilateral;    REVIEW OF SYSTEMS:  Constitutional: positive for fatigue Eyes: negative Ears, nose, mouth, throat, and face: negative Respiratory: negative Cardiovascular: negative Gastrointestinal: negative Genitourinary:negative Integument/breast: negative Hematologic/lymphatic: negative Musculoskeletal:negative Neurological: negative Behavioral/Psych: negative Endocrine: negative Allergic/Immunologic: negative   PHYSICAL EXAMINATION: General appearance: alert, cooperative and no distress Head: Normocephalic, without obvious abnormality, atraumatic Neck: no adenopathy, no JVD, supple, symmetrical, trachea midline and thyroid not enlarged, symmetric, no tenderness/mass/nodules Lymph nodes: Cervical, supraclavicular, and axillary nodes normal. Resp: clear to auscultation bilaterally Back: symmetric, no curvature. ROM normal. No CVA tenderness. Cardio: regular rate and rhythm, S1, S2 normal, no murmur, click, rub or gallop GI: soft, non-tender; bowel sounds normal; no masses,  no organomegaly Extremities: extremities normal, atraumatic, no cyanosis or edema Neurologic: Alert and oriented X 3, normal strength and tone. Normal symmetric reflexes. Normal coordination and gait   ECOG PERFORMANCE STATUS: 1 - Symptomatic but completely ambulatory  Blood pressure (!) 159/70, pulse (!) 111, temperature 98.5 F (36.9 C), temperature source Temporal, resp. rate 20, height 5' 6"  (1.676 m),  weight 180 lb 9.6 oz (81.9 kg), SpO2 100 %.  LABORATORY DATA: Lab Results  Component Value Date   WBC 8.2 09/09/2019   HGB 12.3 09/09/2019   HCT 38.6 09/09/2019   MCV 101.3 (H) 09/09/2019   PLT 342 09/09/2019      Chemistry      Component Value Date/Time   NA 139 08/19/2019 0903   NA 139 08/14/2017 0837   K 4.4 08/19/2019 0903   K 4.4 08/14/2017 0837   CL 104 08/19/2019 0903   CO2 23 08/19/2019 0903   CO2 24 08/14/2017 0837   BUN 10 08/19/2019 0903   BUN 13.3 08/14/2017 0837   CREATININE 0.79 08/19/2019 0903   CREATININE 0.8 08/14/2017 0837      Component Value Date/Time   CALCIUM 9.5 08/19/2019 0903   CALCIUM 9.3 08/14/2017 0837   ALKPHOS 88 08/19/2019 0903   ALKPHOS 107 08/14/2017 0837   AST 12 (L) 08/19/2019 0903   AST 12 08/14/2017 0837   ALT 11 08/19/2019 0903   ALT 12 08/14/2017 0837   BILITOT 0.5 08/19/2019 0903   BILITOT 0.37 08/14/2017 0837       RADIOGRAPHIC STUDIES: CT Chest W Contrast  Result Date: 09/06/2019 CLINICAL DATA:  Patient with history of metastatic lung cancer. Follow-up exam. EXAM: CT CHEST, ABDOMEN, AND PELVIS WITH CONTRAST TECHNIQUE: Multidetector CT imaging of the chest, abdomen and  pelvis was performed following the standard protocol during bolus administration of intravenous contrast. CONTRAST:  156m ISOVUE-300 IOPAMIDOL (ISOVUE-300) INJECTION 61% COMPARISON:  CT C AP 06/13/2019 FINDINGS: CT CHEST FINDINGS Cardiovascular: Normal heart size. Aorta and main pulmonary artery normal in caliber. Thoracic aortic vascular calcifications. Mediastinum/Nodes: No enlarged axillary, mediastinal or hilar lymphadenopathy. Prominent subcentimeter mediastinal nodes. Normal appearance of the esophagus. Lungs/Pleura: Central airways are patent. Motion artifacts limits evaluation of the lower lobes bilaterally. Centrilobular and paraseptal emphysematous changes. Biapical pleuroparenchymal thickening/scarring. No pleural effusion or pneumothorax. Stable post  treatment changes within the medial right upper lobe. Musculoskeletal: Thoracic spine degenerative changes. No aggressive or acute appearing osseous lesions. CT ABDOMEN PELVIS FINDINGS Hepatobiliary: Liver is normal in size and contour. No focal hepatic lesion is identified. Gallbladder is unremarkable. No intrahepatic or extrahepatic biliary ductal dilatation. Pancreas: Unremarkable Spleen: Unremarkable Adrenals/Urinary Tract: Normal adrenal glands. Kidneys enhance symmetrically with contrast. No hydronephrosis. Urinary bladder is unremarkable. Stomach/Bowel: Normal morphology of the stomach. No evidence for bowel obstruction. No free fluid or free intraperitoneal air. Stable postsurgical changes involving the sigmoid colon. Normal appendix. Vascular/Lymphatic: Normal caliber abdominal aorta. Peripheral calcified atherosclerotic plaque. No retroperitoneal lymphadenopathy. Similar-appearing near occlusion of the right external iliac artery. Reproductive: Prior hysterectomy.  Adnexal structures unremarkable. Other: None. Musculoskeletal: Lumbar spine degenerative changes. No aggressive or acute appearing osseous lesions. IMPRESSION: 1. Stable post treatment changes within the right upper lobe. No CT evidence to suggest metastatic disease in the chest, pelvis. 2. Aortic Atherosclerosis (ICD10-I70.0) and Emphysema (ICD10-J43.9). Electronically Signed   By: DLovey NewcomerM.D.   On: 09/06/2019 12:09   CT Abdomen Pelvis W Contrast  Result Date: 09/06/2019 CLINICAL DATA:  Patient with history of metastatic lung cancer. Follow-up exam. EXAM: CT CHEST, ABDOMEN, AND PELVIS WITH CONTRAST TECHNIQUE: Multidetector CT imaging of the chest, abdomen and pelvis was performed following the standard protocol during bolus administration of intravenous contrast. CONTRAST:  1088mISOVUE-300 IOPAMIDOL (ISOVUE-300) INJECTION 61% COMPARISON:  CT C AP 06/13/2019 FINDINGS: CT CHEST FINDINGS Cardiovascular: Normal heart size. Aorta and main  pulmonary artery normal in caliber. Thoracic aortic vascular calcifications. Mediastinum/Nodes: No enlarged axillary, mediastinal or hilar lymphadenopathy. Prominent subcentimeter mediastinal nodes. Normal appearance of the esophagus. Lungs/Pleura: Central airways are patent. Motion artifacts limits evaluation of the lower lobes bilaterally. Centrilobular and paraseptal emphysematous changes. Biapical pleuroparenchymal thickening/scarring. No pleural effusion or pneumothorax. Stable post treatment changes within the medial right upper lobe. Musculoskeletal: Thoracic spine degenerative changes. No aggressive or acute appearing osseous lesions. CT ABDOMEN PELVIS FINDINGS Hepatobiliary: Liver is normal in size and contour. No focal hepatic lesion is identified. Gallbladder is unremarkable. No intrahepatic or extrahepatic biliary ductal dilatation. Pancreas: Unremarkable Spleen: Unremarkable Adrenals/Urinary Tract: Normal adrenal glands. Kidneys enhance symmetrically with contrast. No hydronephrosis. Urinary bladder is unremarkable. Stomach/Bowel: Normal morphology of the stomach. No evidence for bowel obstruction. No free fluid or free intraperitoneal air. Stable postsurgical changes involving the sigmoid colon. Normal appendix. Vascular/Lymphatic: Normal caliber abdominal aorta. Peripheral calcified atherosclerotic plaque. No retroperitoneal lymphadenopathy. Similar-appearing near occlusion of the right external iliac artery. Reproductive: Prior hysterectomy.  Adnexal structures unremarkable. Other: None. Musculoskeletal: Lumbar spine degenerative changes. No aggressive or acute appearing osseous lesions. IMPRESSION: 1. Stable post treatment changes within the right upper lobe. No CT evidence to suggest metastatic disease in the chest, pelvis. 2. Aortic Atherosclerosis (ICD10-I70.0) and Emphysema (ICD10-J43.9). Electronically Signed   By: DrLovey Newcomer.D.   On: 09/06/2019 12:09    ASSESSMENT AND PLAN:  This is a  very  pleasant 69 years old white female with metastatic non-small cell lung cancer, adenocarcinoma status post induction systemic chemotherapy with carboplatin and Alimta with partial response. The patient is currently on maintenance treatment with single agent Alimta status post 91 cycles. The patient continues to tolerate this treatment well with no concerning adverse effects. She had repeat CT scan of the chest performed recently.  I personally and independently reviewed the scans and discussed the results with the patient today. Her scan showed no concerning findings for disease progression. I recommended for her to continue her current treatment with maintenance Alimta and she will proceed with cycle #92 today as planned. For the hypertension, the patient was advised to take her blood pressure medication as prescribed and to monitor it closely at home. She will come back for follow-up visit in 3 weeks for evaluation before starting cycle #93. She was advised to call immediately if she has any concerning symptoms in the interval. The patient voices understanding of current disease status and treatment options and is in agreement with the current care plan. All questions were answered. The patient knows to call the clinic with any problems, questions or concerns. We can certainly see the patient much sooner if necessary.  Disclaimer: This note was dictated with voice recognition software. Similar sounding words can inadvertently be transcribed and may not be corrected upon review.

## 2019-09-09 NOTE — Progress Notes (Signed)
[place] Note  Alexis [name] was very appreciative of follow-up pastoral visit and used the opportunity well to share and process about recent upsets and how she is coping. Provided empathic listening and emotional support, reflecting back that honor and integrity are two of her core values, which she found helpful and affirming. Continuing to follow for support, but please also page if needs arise or circumstances change. Thank you.   [place], [place], [facility] Pager [phone] Voicemail [phone]

## 2019-09-29 NOTE — Progress Notes (Deleted)
[facility] OFFICE PROGRESS NOTE  [name], MD [address] [place] [address]  DIAGNOSIS: Stage IV (T2a, N0, M1b) non-small cell lung cancer, adenocarcinoma with negative EGFR and ALK mutations diagnosed in January 2015 and presented with right upper lobe lung mass in addition to a solitary brain metastasis.  PRIOR THERAPY: 1) Status post stereotactic radiotherapy to a solitary right parietal brain lesion under the care of Dr. [name] on 10/16/2013. 2) Status post palliative radiotherapy to the right lung tumor under the care of Dr. [name] completed on 12/05/2013. 3) Systemic chemotherapy with carboplatin for AUC of 5 and Alimta 500 mg/M2 every 3 weeks. First dose Jan 06 2014. Status post 6 cycles.  CURRENT THERAPY: Systemic chemotherapy with maintenance Alimta 500 MG/M2 every 3 weeks, status post 92cycles.  INTERVAL HISTORY: Alexis [name] 69 y.o. female returns to the clinic for a follow up visit. The patient is feeling well today without any concerning complaints. The patient continues to tolerate treatment with single agent Alimta well without any adverse side effects. Denies any fever, chills, night sweats, or weight loss. Denies any chest pain, shortness of breath, cough, or hemoptysis. Denies any nausea, vomiting, diarrhea, or constipation. Denies any headache or visual changes. Denies any rashes or skin changes. The patient is here today for evaluation prior to starting cycle # 93.  MEDICAL HISTORY: Past Medical History:  Diagnosis Date  . Anxiety   . Anxiety 06/20/2016  . Cervical cancer ([place])   . Encounter for antineoplastic chemotherapy 07/20/2015  . 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  MEDICATIONS:  Current Outpatient Medications  Medication Sig Dispense Refill  . acetaminophen (TYLENOL) 500 MG tablet Take 500 mg by mouth every 6 (six)  hours as needed for mild pain or headache. Reported on 11/02/2015    . ALPRAZolam (XANAX) 0.25 MG tablet Take 1 tablet (0.25 mg total) by mouth 3 (three) times daily as needed. 30 tablet 0  . Ascorbic Acid (VITAMIN C GUMMIE PO) Take 1 each by mouth every morning.    Marland Kitchen aspirin EC 81 MG tablet Take 1 tablet (81 mg total) by mouth daily. 150 tablet 2  . [employer] ACID CONTROLLER 10 MG tablet SMARTSIG:1 Tablet(s) By Mouth Every 12 Hours PRN    . dexamethasone (DECADRON) 4 MG tablet Take 1 pill twice a day the day before, the day of, and the day after chemotherapy. 40 tablet 2  . folic acid (FOLVITE) 1 MG tablet TAKE 1 TABLET BY MOUTH EVERY DAY 90 tablet 0  . HYDROcodone-acetaminophen (NORCO/VICODIN) 5-325 MG tablet TAKE 1 TABLET BY MOUTH 3 TIMES A DAY FOR 5 DAYS AS NEEDED FOR PAIN    . Multiple Vitamin (MULTIVITAMIN WITH MINERALS) TABS tablet Take 1 tablet by mouth every morning.    . ondansetron (ZOFRAN) 8 MG tablet Take one tab po before chemo. (Patient not taking: Reported on 09/09/2019) 30 tablet 0  . OVER THE COUNTER MEDICATION Take 1 tablet by mouth every morning. (Vitamin A)    . prochlorperazine (COMPAZINE) 10 MG tablet Take 1 tablet (10 mg total) by mouth every 6 (six) hours as needed for nausea or vomiting. (Patient not taking: Reported on 09/09/2019) 60 tablet 0  . rosuvastatin (CRESTOR) 10 MG tablet Take 1 tablet (10 mg total) by mouth daily. 30 tablet 12  . senna-docusate (SENOKOT-S) 8.6-50  MG tablet Take 1 tablet by mouth daily. 30 tablet prn  . valACYclovir (VALTREX) 1000 MG tablet Take 1,000 mg by mouth 3 (three) times daily.     No current facility-administered medications for this visit.    SURGICAL HISTORY:  Past Surgical History:  Procedure Laterality Date  . ABDOMINAL HYSTERECTOMY    . COLOSTOMY TAKEDOWN N/A 07/10/2014   Procedure: LAPAROSCOPIC LYSIS OF ADHESIONS (90 MIN) LAPAROSCOPIC ASSISTED COLOSTOMY CLOSURE, RIGID PROCTOSIGMOIDOSCOPY;  Surgeon: Jackolyn Confer, MD;  Location: [facility];  Service: General;  Laterality: N/A;  . LAPAROTOMY N/A 11/03/2013   Procedure: EXPLORATORY LAPAROTOMY, DRAINAGE OF INTRA  ABDOMINAL ABSCESSES, MOBILIZATION OF SPLENIC FLEXURE, SIGMOID COLECTOMY WITH COLOSTOMY;  Surgeon: Odis Hollingshead, MD;  Location: [facility];  Service: General;  Laterality: N/A;  . VIDEO BRONCHOSCOPY Bilateral 08/30/2013   Procedure: VIDEO BRONCHOSCOPY WITH FLUORO;  Surgeon: Tanda Rockers, MD;  Location: [facility];  Service: Cardiopulmonary;  Laterality: Bilateral;    REVIEW OF SYSTEMS:   Review of Systems  Constitutional: Negative for appetite change, chills, fatigue, fever and unexpected weight change.  HENT:   Negative for mouth sores, nosebleeds, sore throat and trouble swallowing.   Eyes: Negative for eye problems and icterus.  Respiratory: Negative for cough, hemoptysis, shortness of breath and wheezing.   Cardiovascular: Negative for chest pain and leg swelling.  Gastrointestinal: Negative for abdominal pain, constipation, diarrhea, nausea and vomiting.  Genitourinary: Negative for bladder incontinence, difficulty urinating, dysuria, frequency and hematuria.   Musculoskeletal: Negative for back pain, gait problem, neck pain and neck stiffness.  Skin: Negative for itching and rash.  Neurological: Negative for dizziness, extremity weakness, gait problem, headaches, light-headedness and seizures.  Hematological: Negative for adenopathy. Does not bruise/bleed easily.  Psychiatric/Behavioral: Negative for confusion, depression and sleep disturbance. The patient is not nervous/anxious.     PHYSICAL EXAMINATION:  There were no vitals taken for this visit.  ECOG PERFORMANCE STATUS: {CHL ONC ECOG [id number]  Physical Exam  Constitutional: Oriented to person, place, and time and well-developed, well-nourished, and in no distress. No distress.  HENT:  Head: Normocephalic and atraumatic.  Mouth/Throat: Oropharynx is clear and moist. No oropharyngeal exudate.   Eyes: Conjunctivae are normal. Right eye exhibits no discharge. Left eye exhibits no discharge. No scleral icterus.  Neck: Normal range of motion. Neck supple.  Cardiovascular: Normal rate, regular rhythm, normal heart sounds and intact distal pulses.   Pulmonary/Chest: Effort normal and breath sounds normal. No respiratory distress. No wheezes. No rales.  Abdominal: Soft. Bowel sounds are normal. Exhibits no distension and no mass. There is no tenderness.  Musculoskeletal: Normal range of motion. Exhibits no edema.  Lymphadenopathy:    No cervical adenopathy.  Neurological: Alert and oriented to person, place, and time. Exhibits normal muscle tone. Gait normal. Coordination normal.  Skin: Skin is warm and dry. No rash noted. Not diaphoretic. No erythema. No pallor.  Psychiatric: Mood, memory and judgment normal.  Vitals reviewed.  LABORATORY DATA: Lab Results  Component Value Date   WBC 8.2 09/09/2019   HGB 12.3 09/09/2019   HCT 38.6 09/09/2019   MCV 101.3 (H) 09/09/2019   PLT 342 09/09/2019      Chemistry      Component Value Date/Time   NA 141 09/09/2019 0807   NA 139 08/14/2017 0837   K 3.9 09/09/2019 0807   K 4.4 08/14/2017 0837   CL 106 09/09/2019 0807   CO2 24 09/09/2019 0807   CO2 24 08/14/2017 0837   BUN 12  09/09/2019 0807   BUN 13.3 08/14/2017 0837   CREATININE 0.76 09/09/2019 0807   CREATININE 0.8 08/14/2017 0837      Component Value Date/Time   CALCIUM 9.3 09/09/2019 0807   CALCIUM 9.3 08/14/2017 0837   ALKPHOS 88 09/09/2019 0807   ALKPHOS 107 08/14/2017 0837   AST 11 (L) 09/09/2019 0807   AST 12 08/14/2017 0837   ALT 13 09/09/2019 0807   ALT 12 08/14/2017 0837   BILITOT 0.5 09/09/2019 0807   BILITOT 0.37 08/14/2017 0837       RADIOGRAPHIC STUDIES:  CT Chest W Contrast  Result Date: 09/06/2019 CLINICAL DATA:  Patient with history of metastatic lung cancer. Follow-up exam. EXAM: CT CHEST, ABDOMEN, AND PELVIS WITH CONTRAST TECHNIQUE: Multidetector  CT imaging of the chest, abdomen and pelvis was performed following the standard protocol during bolus administration of intravenous contrast. CONTRAST:  133m ISOVUE-300 IOPAMIDOL (ISOVUE-300) INJECTION 61% COMPARISON:  CT C AP 06/13/2019 FINDINGS: CT CHEST FINDINGS Cardiovascular: Normal heart size. Aorta and main pulmonary artery normal in caliber. Thoracic aortic vascular calcifications. Mediastinum/Nodes: No enlarged axillary, mediastinal or hilar lymphadenopathy. Prominent subcentimeter mediastinal nodes. Normal appearance of the esophagus. Lungs/Pleura: Central airways are patent. Motion artifacts limits evaluation of the lower lobes bilaterally. Centrilobular and paraseptal emphysematous changes. Biapical pleuroparenchymal thickening/scarring. No pleural effusion or pneumothorax. Stable post treatment changes within the medial right upper lobe. Musculoskeletal: Thoracic spine degenerative changes. No aggressive or acute appearing osseous lesions. CT ABDOMEN PELVIS FINDINGS Hepatobiliary: Liver is normal in size and contour. No focal hepatic lesion is identified. Gallbladder is unremarkable. No intrahepatic or extrahepatic biliary ductal dilatation. Pancreas: Unremarkable Spleen: Unremarkable Adrenals/Urinary Tract: Normal adrenal glands. Kidneys enhance symmetrically with contrast. No hydronephrosis. Urinary bladder is unremarkable. Stomach/Bowel: Normal morphology of the stomach. No evidence for bowel obstruction. No free fluid or free intraperitoneal air. Stable postsurgical changes involving the sigmoid colon. Normal appendix. Vascular/Lymphatic: Normal caliber abdominal aorta. Peripheral calcified atherosclerotic plaque. No retroperitoneal lymphadenopathy. Similar-appearing near occlusion of the right external iliac artery. Reproductive: Prior hysterectomy.  Adnexal structures unremarkable. Other: None. Musculoskeletal: Lumbar spine degenerative changes. No aggressive or acute appearing osseous lesions.  IMPRESSION: 1. Stable post treatment changes within the right upper lobe. No CT evidence to suggest metastatic disease in the chest, pelvis. 2. Aortic Atherosclerosis (ICD10-I70.0) and Emphysema (ICD10-J43.9). Electronically Signed   By: DLovey NewcomerM.D.   On: 09/06/2019 12:09   CT Abdomen Pelvis W Contrast  Result Date: 09/06/2019 CLINICAL DATA:  Patient with history of metastatic lung cancer. Follow-up exam. EXAM: CT CHEST, ABDOMEN, AND PELVIS WITH CONTRAST TECHNIQUE: Multidetector CT imaging of the chest, abdomen and pelvis was performed following the standard protocol during bolus administration of intravenous contrast. CONTRAST:  1020mISOVUE-300 IOPAMIDOL (ISOVUE-300) INJECTION 61% COMPARISON:  CT C AP 06/13/2019 FINDINGS: CT CHEST FINDINGS Cardiovascular: Normal heart size. Aorta and main pulmonary artery normal in caliber. Thoracic aortic vascular calcifications. Mediastinum/Nodes: No enlarged axillary, mediastinal or hilar lymphadenopathy. Prominent subcentimeter mediastinal nodes. Normal appearance of the esophagus. Lungs/Pleura: Central airways are patent. Motion artifacts limits evaluation of the lower lobes bilaterally. Centrilobular and paraseptal emphysematous changes. Biapical pleuroparenchymal thickening/scarring. No pleural effusion or pneumothorax. Stable post treatment changes within the medial right upper lobe. Musculoskeletal: Thoracic spine degenerative changes. No aggressive or acute appearing osseous lesions. CT ABDOMEN PELVIS FINDINGS Hepatobiliary: Liver is normal in size and contour. No focal hepatic lesion is identified. Gallbladder is unremarkable. No intrahepatic or extrahepatic biliary ductal dilatation. Pancreas: Unremarkable Spleen: Unremarkable Adrenals/Urinary Tract: Normal adrenal glands.  Kidneys enhance symmetrically with contrast. No hydronephrosis. Urinary bladder is unremarkable. Stomach/Bowel: Normal morphology of the stomach. No evidence for bowel obstruction. No free  fluid or free intraperitoneal air. Stable postsurgical changes involving the sigmoid colon. Normal appendix. Vascular/Lymphatic: Normal caliber abdominal aorta. Peripheral calcified atherosclerotic plaque. No retroperitoneal lymphadenopathy. Similar-appearing near occlusion of the right external iliac artery. Reproductive: Prior hysterectomy.  Adnexal structures unremarkable. Other: None. Musculoskeletal: Lumbar spine degenerative changes. No aggressive or acute appearing osseous lesions. IMPRESSION: 1. Stable post treatment changes within the right upper lobe. No CT evidence to suggest metastatic disease in the chest, pelvis. 2. Aortic Atherosclerosis (ICD10-I70.0) and Emphysema (ICD10-J43.9). Electronically Signed   By: Lovey Newcomer M.D.   On: 09/06/2019 12:09     ASSESSMENT/PLAN:  This isa very pleasant 69 year old Caucasian female with stage IV non-small cell lung cancer, adenocarcinoma who presented with a right upper lobe lungmass in addition to a solitary brain metastatsis. She was diagnosed in January 2015. The patient had completed induction systemic chemotherapy with carboplatin and Alimta with a partial response. The patient is currently being treated with single agentmaintenanceAlimta. She is status post92cycles.   Labs were reviewed. Recommend that she proceed with cycle #93 today as scheduled.   We will see her back for a follow up visit in 3 weeks for evaluation before starting cycle #94.   The patient was advised to call immediately if she has any concerning symptoms in the interval. The patient voices understanding of current disease status and treatment options and is in agreement with the current care plan. All questions were answered. The patient knows to call the clinic with any problems, questions or concerns. We can certainly see the patient much sooner if necessary  No orders of the defined types were placed in this encounter.    [place],  PA-C 09/29/19

## 2019-09-30 ENCOUNTER — Telehealth: Payer: Self-pay | Admitting: Physician Assistant

## 2019-09-30 ENCOUNTER — Other Ambulatory Visit: Payer: Medicare Other

## 2019-09-30 ENCOUNTER — Ambulatory Visit: Payer: Medicare Other | Admitting: Physician Assistant

## 2019-09-30 ENCOUNTER — Encounter: Payer: Self-pay | Admitting: General Practice

## 2019-09-30 ENCOUNTER — Ambulatory Visit: Payer: Medicare Other

## 2019-09-30 NOTE — Telephone Encounter (Signed)
Spoke to the patient to confirm her rescheduled appointments. She expressed understanding and she knows to arrive a little early.

## 2019-09-30 NOTE — Progress Notes (Signed)
[facility] Spiritual Care Note  Had originally planned to see Alexis [name] in infusion, but learned after her schedule had changed that she pushed back her appointment due to icy road conditions. She welcomed my phone call checking on her, and we hope to be able to follow up in person at her rescheduled infusion.   [place], [place], [facility] Pager [phone] Voicemail [phone]

## 2019-10-01 NOTE — Progress Notes (Signed)
[place] OFFICE PROGRESS NOTE  [name], MD [address] [place] [address]  DIAGNOSIS: Stage IV (T2a, N0, M1b) non-small cell lung cancer, adenocarcinoma with negative EGFR and ALK mutations diagnosed in January 2015 and presented with right upper lobe lung mass in addition to a solitary brain metastasis.  PRIOR THERAPY: 1) Status post stereotactic radiotherapy to a solitary right parietal brain lesion under the care of Dr. [name] on 10/16/2013. 2) Status post palliative radiotherapy to the right lung tumor under the care of Dr. [name] completed on 12/05/2013. 3) Systemic chemotherapy with carboplatin for AUC of 5 and Alimta 500 mg/M2 every 3 weeks. First dose Jan 06 2014. Status post 6 cycles.  CURRENT THERAPY: Systemic chemotherapy with maintenance Alimta 500 MG/M2 every 3 weeks, status post 92cycles.  INTERVAL HISTORY: KENECIA BARREN 69 y.o. female returns to the clinic for a follow up visit. The patient is feeling well today without any concerning complaints except for fatigue. She had routine blood work performed by her PCP recently. She was found to be vitamin D deficient and is taking mediations for that. She also has an elevated A1C and has been trying to exercise and loose weight. The patient continues to tolerate treatment with single agent Alimta well without any adverse side effects except some facial flushing from her steroid. Denies any fever, chills, night sweats, or unexplained weight loss. Denies any chest pain, shortness of breath, cough, or hemoptysis. Denies any nausea, vomiting, diarrhea. She has mild constipation after chemotherapy which is managed successfully with a stool softener. Denies any headache or visual changes. The patient is here today for evaluation prior to starting cycle # 22   MEDICAL HISTORY: Past Medical History:  Diagnosis Date  . Anxiety   . Anxiety 06/20/2016  . Cervical cancer ([facility])   . Encounter for antineoplastic  chemotherapy 07/20/2015  . 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  MEDICATIONS:  Current Outpatient Medications  Medication Sig Dispense Refill  . acetaminophen (TYLENOL) 500 MG tablet Take 500 mg by mouth every 6 (six) hours as needed for mild pain or headache. Reported on 11/02/2015    . ALPRAZolam (XANAX) 0.25 MG tablet Take 1 tablet (0.25 mg total) by mouth 3 (three) times daily as needed. 30 tablet 0  . Ascorbic Acid (VITAMIN C GUMMIE PO) Take 1 each by mouth every morning.    Marland Kitchen aspirin EC 81 MG tablet Take 1 tablet (81 mg total) by mouth daily. 150 tablet 2  . [employer] ACID CONTROLLER 10 MG tablet SMARTSIG:1 Tablet(s) By Mouth Every 12 Hours PRN    . dexamethasone (DECADRON) 4 MG tablet Take 1 pill twice a day the day before, the day of, and the day after chemotherapy. 40 tablet 2  . folic acid (FOLVITE) 1 MG tablet TAKE 1 TABLET BY MOUTH EVERY DAY 90 tablet 0  . HYDROcodone-acetaminophen (NORCO/VICODIN) 5-325 MG tablet TAKE 1 TABLET BY MOUTH 3 TIMES A DAY FOR 5 DAYS AS NEEDED FOR PAIN    . Multiple Vitamin (MULTIVITAMIN WITH MINERALS) TABS tablet Take 1 tablet by mouth every morning.    . ondansetron (ZOFRAN) 8 MG tablet Take one tab po before chemo. (Patient not taking: Reported on 09/09/2019) 30 tablet 0  . OVER THE COUNTER MEDICATION Take 1 tablet by mouth every morning. (Vitamin A)    .  prochlorperazine (COMPAZINE) 10 MG tablet Take 1 tablet (10 mg total) by mouth every 6 (six) hours as needed for nausea or vomiting. (Patient not taking: Reported on 09/09/2019) 60 tablet 0  . rosuvastatin (CRESTOR) 10 MG tablet Take 1 tablet (10 mg total) by mouth daily. 30 tablet 12  . senna-docusate (SENOKOT-S) 8.6-50 MG tablet Take 1 tablet by mouth daily. 30 tablet prn  . valACYclovir (VALTREX) 1000 MG tablet Take 1,000 mg by mouth 3 (three) times daily.     No current  facility-administered medications for this visit.    SURGICAL HISTORY:  Past Surgical History:  Procedure Laterality Date  . ABDOMINAL HYSTERECTOMY    . COLOSTOMY TAKEDOWN N/A 07/10/2014   Procedure: LAPAROSCOPIC LYSIS OF ADHESIONS (90 MIN) LAPAROSCOPIC ASSISTED COLOSTOMY CLOSURE, RIGID PROCTOSIGMOIDOSCOPY;  Surgeon: Jackolyn Confer, MD;  Location: [facility];  Service: General;  Laterality: N/A;  . LAPAROTOMY N/A 11/03/2013   Procedure: EXPLORATORY LAPAROTOMY, DRAINAGE OF INTRA  ABDOMINAL ABSCESSES, MOBILIZATION OF SPLENIC FLEXURE, SIGMOID COLECTOMY WITH COLOSTOMY;  Surgeon: Odis Hollingshead, MD;  Location: [facility];  Service: General;  Laterality: N/A;  . VIDEO BRONCHOSCOPY Bilateral 08/30/2013   Procedure: VIDEO BRONCHOSCOPY WITH FLUORO;  Surgeon: Tanda Rockers, MD;  Location: [facility];  Service: Cardiopulmonary;  Laterality: Bilateral;    REVIEW OF SYSTEMS:   Review of Systems  Constitutional: Positive for fatigue. Negative for appetite change, chills, fever and unexpected weight change.  HENT: Negative for mouth sores, nosebleeds, sore throat and trouble swallowing.  Eyes: Negative for eye problems and icterus.  Respiratory: Negative for cough, hemoptysis, shortness of breath and wheezing.   Cardiovascular: Negative for chest pain and leg swelling.  Gastrointestinal: Positive for mild constipation successfully managed with stool softener. Negative for abdominal pain, diarrhea, nausea and vomiting.  Genitourinary: Negative for bladder incontinence, difficulty urinating, dysuria, frequency and hematuria.   Musculoskeletal: Negative for back pain, gait problem, neck pain and neck stiffness.  Skin: Negative for itching and rash.  Neurological: Negative for dizziness, extremity weakness, gait problem, headaches, light-headedness and seizures.  Hematological: Negative for adenopathy. Does not bruise/bleed easily.  Psychiatric/Behavioral: Negative for confusion, depression and sleep  disturbance. The patient is not nervous/anxious.     PHYSICAL EXAMINATION:  There were no vitals taken for this visit.  ECOG PERFORMANCE STATUS: 1 - Symptomatic but completely ambulatory  Physical Exam  Constitutional: Oriented to person, place, and time and well-developed, well-nourished, and in no distress.  HENT:  Head: Normocephalic and atraumatic.  Mouth/Throat: Oropharynx is clear and moist. No oropharyngeal exudate.  Eyes: Conjunctivae are normal. Right eye exhibits no discharge. Left eye exhibits no discharge. No scleral icterus.  Neck: Normal range of motion. Neck supple.  Cardiovascular: Normal rate, regular rhythm, normal heart sounds and intact distal pulses.   Pulmonary/Chest: Effort normal and breath sounds normal. No respiratory distress. No wheezes. No rales.  Abdominal: Soft. Bowel sounds are normal. Exhibits no distension and no mass. There is no tenderness.  Musculoskeletal: Normal range of motion. Exhibits no edema.  Lymphadenopathy:    No cervical adenopathy.  Neurological: Alert and oriented to person, place, and time. Exhibits normal muscle tone. Gait normal. Coordination normal.  Skin: Skin is warm and dry. No rash noted. Not diaphoretic. No erythema. No pallor.  Psychiatric: Mood, memory and judgment normal.  Vitals reviewed.  LABORATORY DATA: Lab Results  Component Value Date   WBC 8.2 09/09/2019   HGB 12.3 09/09/2019   HCT 38.6 09/09/2019   MCV 101.3 (H) 09/09/2019  PLT 342 09/09/2019      Chemistry      Component Value Date/Time   NA 141 09/09/2019 0807   NA 139 08/14/2017 0837   K 3.9 09/09/2019 0807   K 4.4 08/14/2017 0837   CL 106 09/09/2019 0807   CO2 24 09/09/2019 0807   CO2 24 08/14/2017 0837   BUN 12 09/09/2019 0807   BUN 13.3 08/14/2017 0837   CREATININE 0.76 09/09/2019 0807   CREATININE 0.8 08/14/2017 0837      Component Value Date/Time   CALCIUM 9.3 09/09/2019 0807   CALCIUM 9.3 08/14/2017 0837   ALKPHOS 88 09/09/2019 0807    ALKPHOS 107 08/14/2017 0837   AST 11 (L) 09/09/2019 0807   AST 12 08/14/2017 0837   ALT 13 09/09/2019 0807   ALT 12 08/14/2017 0837   BILITOT 0.5 09/09/2019 0807   BILITOT 0.37 08/14/2017 0837       RADIOGRAPHIC STUDIES:  CT Chest W Contrast  Result Date: 09/06/2019 CLINICAL DATA:  Patient with history of metastatic lung cancer. Follow-up exam. EXAM: CT CHEST, ABDOMEN, AND PELVIS WITH CONTRAST TECHNIQUE: Multidetector CT imaging of the chest, abdomen and pelvis was performed following the standard protocol during bolus administration of intravenous contrast. CONTRAST:  176m ISOVUE-300 IOPAMIDOL (ISOVUE-300) INJECTION 61% COMPARISON:  CT C AP 06/13/2019 FINDINGS: CT CHEST FINDINGS Cardiovascular: Normal heart size. Aorta and main pulmonary artery normal in caliber. Thoracic aortic vascular calcifications. Mediastinum/Nodes: No enlarged axillary, mediastinal or hilar lymphadenopathy. Prominent subcentimeter mediastinal nodes. Normal appearance of the esophagus. Lungs/Pleura: Central airways are patent. Motion artifacts limits evaluation of the lower lobes bilaterally. Centrilobular and paraseptal emphysematous changes. Biapical pleuroparenchymal thickening/scarring. No pleural effusion or pneumothorax. Stable post treatment changes within the medial right upper lobe. Musculoskeletal: Thoracic spine degenerative changes. No aggressive or acute appearing osseous lesions. CT ABDOMEN PELVIS FINDINGS Hepatobiliary: Liver is normal in size and contour. No focal hepatic lesion is identified. Gallbladder is unremarkable. No intrahepatic or extrahepatic biliary ductal dilatation. Pancreas: Unremarkable Spleen: Unremarkable Adrenals/Urinary Tract: Normal adrenal glands. Kidneys enhance symmetrically with contrast. No hydronephrosis. Urinary bladder is unremarkable. Stomach/Bowel: Normal morphology of the stomach. No evidence for bowel obstruction. No free fluid or free intraperitoneal air. Stable postsurgical  changes involving the sigmoid colon. Normal appendix. Vascular/Lymphatic: Normal caliber abdominal aorta. Peripheral calcified atherosclerotic plaque. No retroperitoneal lymphadenopathy. Similar-appearing near occlusion of the right external iliac artery. Reproductive: Prior hysterectomy.  Adnexal structures unremarkable. Other: None. Musculoskeletal: Lumbar spine degenerative changes. No aggressive or acute appearing osseous lesions. IMPRESSION: 1. Stable post treatment changes within the right upper lobe. No CT evidence to suggest metastatic disease in the chest, pelvis. 2. Aortic Atherosclerosis (ICD10-I70.0) and Emphysema (ICD10-J43.9). Electronically Signed   By: DLovey NewcomerM.D.   On: 09/06/2019 12:09   CT Abdomen Pelvis W Contrast  Result Date: 09/06/2019 CLINICAL DATA:  Patient with history of metastatic lung cancer. Follow-up exam. EXAM: CT CHEST, ABDOMEN, AND PELVIS WITH CONTRAST TECHNIQUE: Multidetector CT imaging of the chest, abdomen and pelvis was performed following the standard protocol during bolus administration of intravenous contrast. CONTRAST:  10102mISOVUE-300 IOPAMIDOL (ISOVUE-300) INJECTION 61% COMPARISON:  CT C AP 06/13/2019 FINDINGS: CT CHEST FINDINGS Cardiovascular: Normal heart size. Aorta and main pulmonary artery normal in caliber. Thoracic aortic vascular calcifications. Mediastinum/Nodes: No enlarged axillary, mediastinal or hilar lymphadenopathy. Prominent subcentimeter mediastinal nodes. Normal appearance of the esophagus. Lungs/Pleura: Central airways are patent. Motion artifacts limits evaluation of the lower lobes bilaterally. Centrilobular and paraseptal emphysematous changes. Biapical pleuroparenchymal thickening/scarring. No  pleural effusion or pneumothorax. Stable post treatment changes within the medial right upper lobe. Musculoskeletal: Thoracic spine degenerative changes. No aggressive or acute appearing osseous lesions. CT ABDOMEN PELVIS FINDINGS Hepatobiliary: Liver  is normal in size and contour. No focal hepatic lesion is identified. Gallbladder is unremarkable. No intrahepatic or extrahepatic biliary ductal dilatation. Pancreas: Unremarkable Spleen: Unremarkable Adrenals/Urinary Tract: Normal adrenal glands. Kidneys enhance symmetrically with contrast. No hydronephrosis. Urinary bladder is unremarkable. Stomach/Bowel: Normal morphology of the stomach. No evidence for bowel obstruction. No free fluid or free intraperitoneal air. Stable postsurgical changes involving the sigmoid colon. Normal appendix. Vascular/Lymphatic: Normal caliber abdominal aorta. Peripheral calcified atherosclerotic plaque. No retroperitoneal lymphadenopathy. Similar-appearing near occlusion of the right external iliac artery. Reproductive: Prior hysterectomy.  Adnexal structures unremarkable. Other: None. Musculoskeletal: Lumbar spine degenerative changes. No aggressive or acute appearing osseous lesions. IMPRESSION: 1. Stable post treatment changes within the right upper lobe. No CT evidence to suggest metastatic disease in the chest, pelvis. 2. Aortic Atherosclerosis (ICD10-I70.0) and Emphysema (ICD10-J43.9). Electronically Signed   By: Lovey Newcomer M.D.   On: 09/06/2019 12:09     ASSESSMENT/PLAN:  This isa very pleasant 69 year old Caucasian female with stage IV non-small cell lung cancer, adenocarcinoma who presented with a right upper lobe lungmass in addition to a solitary brain metastatsis. She was diagnosed in January 2015. The patient had completed induction systemic chemotherapy with carboplatin and Alimta with a partial response. The patient is currently being treated with single agentmaintenanceAlimta. She is status post92cycles.   Labs were reviewed with the patient. I recommend she proceed with cycle #93 today as scheduled.  We will see her back for a follow up visit in 3 weeks for evaluation before starting cycle #94.   She will continue to take her decadron the day  before, the day of, and the day after chemotherapy.  The patient was advised to call immediately if she has any concerning symptoms in the interval. The patient voices understanding of current disease status and treatment options and is in agreement with the current care plan. All questions were answered. The patient knows to call the clinic with any problems, questions or concerns. We can certainly see the patient much sooner if necessary   No orders of the defined types were placed in this encounter.    Evangelos Paulino L Seymore Brodowski, PA-C 10/01/19

## 2019-10-02 ENCOUNTER — Inpatient Hospital Stay: Payer: Medicare Other

## 2019-10-02 ENCOUNTER — Inpatient Hospital Stay (HOSPITAL_BASED_OUTPATIENT_CLINIC_OR_DEPARTMENT_OTHER): Payer: Medicare Other | Admitting: Physician Assistant

## 2019-10-02 ENCOUNTER — Encounter: Payer: Self-pay | Admitting: Physician Assistant

## 2019-10-02 ENCOUNTER — Inpatient Hospital Stay: Payer: Medicare Other | Attending: Internal Medicine

## 2019-10-02 ENCOUNTER — Other Ambulatory Visit: Payer: Self-pay

## 2019-10-02 VITALS — BP 145/74 | HR 81 | Temp 98.0°F | Resp 18 | Ht 66.0 in | Wt 179.2 lb

## 2019-10-02 DIAGNOSIS — Z923 Personal history of irradiation: Secondary | ICD-10-CM | POA: Insufficient documentation

## 2019-10-02 DIAGNOSIS — Z5112 Encounter for antineoplastic immunotherapy: Secondary | ICD-10-CM | POA: Diagnosis not present

## 2019-10-02 DIAGNOSIS — C7931 Secondary malignant neoplasm of brain: Secondary | ICD-10-CM | POA: Diagnosis not present

## 2019-10-02 DIAGNOSIS — Z5111 Encounter for antineoplastic chemotherapy: Secondary | ICD-10-CM | POA: Diagnosis not present

## 2019-10-02 DIAGNOSIS — Z9221 Personal history of antineoplastic chemotherapy: Secondary | ICD-10-CM | POA: Insufficient documentation

## 2019-10-02 DIAGNOSIS — C3411 Malignant neoplasm of upper lobe, right bronchus or lung: Secondary | ICD-10-CM | POA: Diagnosis not present

## 2019-10-02 DIAGNOSIS — Z79899 Other long term (current) drug therapy: Secondary | ICD-10-CM | POA: Insufficient documentation

## 2019-10-02 LAB — CMP (CANCER CENTER ONLY)
ALT: 16 U/L (ref 0–44)
AST: 11 U/L — ABNORMAL LOW (ref 15–41)
Albumin: 3.5 g/dL (ref 3.5–5.0)
Alkaline Phosphatase: 88 U/L (ref 38–126)
Anion gap: 11 (ref 5–15)
BUN: 21 mg/dL (ref 8–23)
CO2: 26 mmol/L (ref 22–32)
Calcium: 9 mg/dL (ref 8.9–10.3)
Chloride: 102 mmol/L (ref 98–111)
Creatinine: 0.85 mg/dL (ref 0.44–1.00)
GFR, Est AFR Am: >60 mL/min (ref >60)
GFR, Estimated: >60 mL/min (ref >60)
Glucose, Bld: 220 mg/dL — ABNORMAL HIGH (ref 70–99)
Potassium: 3.9 mmol/L (ref 3.5–5.1)
Sodium: 139 mmol/L (ref 135–145)
Total Bilirubin: 0.3 mg/dL (ref 0.3–1.2)
Total Protein: 6.6 g/dL (ref 6.5–8.1)

## 2019-10-02 LAB — CBC WITH DIFFERENTIAL (CANCER CENTER ONLY)
Abs Immature Granulocytes: 0.35 10*3/uL — ABNORMAL HIGH (ref 0.00–0.07)
Basophils Absolute: 0 10*3/uL (ref 0.0–0.1)
Basophils Relative: 0 %
Eosinophils Absolute: 0 10*3/uL (ref 0.0–0.5)
Eosinophils Relative: 0 %
HCT: 39.1 % (ref 36.0–46.0)
Hemoglobin: 12.3 g/dL (ref 12.0–15.0)
Immature Granulocytes: 2 %
Lymphocytes Relative: 6 %
Lymphs Abs: 0.9 10*3/uL (ref 0.7–4.0)
MCH: 31.9 pg (ref 26.0–34.0)
MCHC: 31.5 g/dL (ref 30.0–36.0)
MCV: 101.3 fL — ABNORMAL HIGH (ref 80.0–100.0)
Monocytes Absolute: 1.7 10*3/uL — ABNORMAL HIGH (ref 0.1–1.0)
Monocytes Relative: 11 %
Neutro Abs: 12.5 10*3/uL — ABNORMAL HIGH (ref 1.7–7.7)
Neutrophils Relative %: 81 %
Platelet Count: 437 10*3/uL — ABNORMAL HIGH (ref 150–400)
RBC: 3.86 MIL/uL — ABNORMAL LOW (ref 3.87–5.11)
RDW: 14.7 % (ref 11.5–15.5)
WBC Count: 15.4 10*3/uL — ABNORMAL HIGH (ref 4.0–10.5)
nRBC: 0 % (ref 0.0–0.2)

## 2019-10-02 MED ORDER — DEXAMETHASONE SODIUM PHOSPHATE 10 MG/ML IJ SOLN
INTRAMUSCULAR | Status: AC
  Filled 2019-10-02: qty 1

## 2019-10-02 MED ORDER — DEXAMETHASONE SODIUM PHOSPHATE 10 MG/ML IJ SOLN
10.0000 mg | Freq: Once | INTRAMUSCULAR | Status: AC
  Administered 2019-10-02: 16:00:00 10 mg via INTRAVENOUS

## 2019-10-02 MED ORDER — SODIUM CHLORIDE 0.9 % IV SOLN
Freq: Once | INTRAVENOUS | Status: AC
  Filled 2019-10-02: qty 250

## 2019-10-02 MED ORDER — SODIUM CHLORIDE 0.9 % IV SOLN
500.0000 mg/m2 | Freq: Once | INTRAVENOUS | Status: AC
  Administered 2019-10-02: 900 mg via INTRAVENOUS
  Filled 2019-10-02: qty 20

## 2019-10-02 NOTE — Patient Instructions (Signed)
[facility] Discharge Instructions for Patients Receiving Chemotherapy  Today you received the following chemotherapy agents: Alimta   To help prevent nausea and vomiting after your treatment, we encourage you to take your nausea medication as directed.    If you develop nausea and vomiting that is not controlled by your nausea medication, call the clinic.   BELOW ARE SYMPTOMS THAT SHOULD BE REPORTED IMMEDIATELY:  *FEVER GREATER THAN 100.5 F  *CHILLS WITH OR WITHOUT FEVER  NAUSEA AND VOMITING THAT IS NOT CONTROLLED WITH YOUR NAUSEA MEDICATION  *UNUSUAL SHORTNESS OF BREATH  *UNUSUAL BRUISING OR BLEEDING  TENDERNESS IN MOUTH AND THROAT WITH OR WITHOUT PRESENCE OF ULCERS  *URINARY PROBLEMS  *BOWEL PROBLEMS  UNUSUAL RASH Items with * indicate a potential emergency and should be followed up as soon as possible.  Feel free to call the clinic should you have any questions or concerns. The clinic phone number is [phone].  Please show the [facility] at check-in to the Emergency Department and triage nurse.

## 2019-10-09 ENCOUNTER — Other Ambulatory Visit: Payer: Self-pay | Admitting: Internal Medicine

## 2019-10-09 NOTE — Telephone Encounter (Signed)
Refill request

## 2019-10-16 ENCOUNTER — Telehealth: Payer: Self-pay | Admitting: *Deleted

## 2019-10-16 NOTE — Telephone Encounter (Signed)
Oncology Nurse Navigator Documentation  Oncology Nurse Navigator Flowsheets 10/16/2019  Navigator Location [facility]  Navigator Encounter Type Telephone  Telephone Incoming Call;Outgoing Call/I received a call from patient's husband. He as inquiring when patient should get COVID vaccine.  I contacted PA-C and she updated me a week before or a week after.  I updated patients husband.  He has been having trouble locating a place to get vaccine.  I gave him the number of health dept to call for help.    Patient Visit Type -  Treatment Phase Treatment  Barriers/Navigation Needs Coordination of Care;Education  Education Other  Interventions Coordination of Care;Education  Acuity Level 2-Minimal Needs (1-2 Barriers Identified)  Coordination of Care -  Education Method Verbal  Support Groups/Services -  Time Spent with Patient 30  Time Spent with Patient (Retired) -

## 2019-10-21 NOTE — Progress Notes (Signed)
[facility] OFFICE PROGRESS NOTE  Alexis Roers, MD [address] [place] [address]  DIAGNOSIS: Stage IV (T2a, N0, M1b) non-small cell lung cancer, adenocarcinoma with negative EGFR and ALK mutations diagnosed in January 2015 and presented with right upper lobe lung mass in addition to a solitary brain metastasis.  PRIOR THERAPY: 1) Status post stereotactic radiotherapy to a solitary right parietal brain lesion under the care of Dr. [name] on 10/16/2013. 2) Status post palliative radiotherapy to the right lung tumor under the care of Dr. [name] completed on 12/05/2013. 3) Systemic chemotherapy with carboplatin for AUC of 5 and Alimta 500 mg/M2 every 3 weeks. First dose Jan 06 2014. Status post 6 cycles.  CURRENT THERAPY: Systemic chemotherapy with maintenance Alimta 500 MG/M2 every 3 weeks, status post 93cycles.  INTERVAL HISTORY: Alexis [name] 69 y.o. female returns to the clinic for a follow up visit. The patient is feeling well today without any concerning complaints except for an occasional catching feeling in her right lateral abdomen. This is not new and is exacerbated by particular movements. She had abdominal surgery several years ago for diverticulitis and had a colostomy bag for several months.   The patient continues to tolerate treatment with single agent Alimta well without any adverse side effects except some facial flushing from her steroid. Denies any fever, chills, night sweats, or unexplained weight loss. Denies any chest pain, shortness of breath, cough, or hemoptysis. Denies any nausea, vomiting, diarrhea. She has mild constipation after chemotherapy which is managed successfully with a stool softener. Denies any headache or visual changes. The patient is here today for evaluation prior to starting cycle # 82  MEDICAL HISTORY: Past Medical History:  Diagnosis Date  . Anxiety   . Anxiety 06/20/2016  . Cervical cancer ([place])   . Encounter for antineoplastic  chemotherapy 07/20/2015  . 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  MEDICATIONS:  Current Outpatient Medications  Medication Sig Dispense Refill  . acetaminophen (TYLENOL) 500 MG tablet Take 500 mg by mouth every 6 (six) hours as needed for mild pain or headache. Reported on 11/02/2015    . ALPRAZolam (XANAX) 0.25 MG tablet Take 1 tablet (0.25 mg total) by mouth 3 (three) times daily as needed. 30 tablet 0  . Ascorbic Acid (VITAMIN C GUMMIE PO) Take 1 each by mouth every morning.    Marland Kitchen aspirin EC 81 MG tablet Take 1 tablet (81 mg total) by mouth daily. 150 tablet 2  . [employer] ACID CONTROLLER 10 MG tablet SMARTSIG:1 Tablet(s) By Mouth Every 12 Hours PRN    . dexamethasone (DECADRON) 4 MG tablet Take 1 pill twice a day the day before, the day of, and the day after chemotherapy. 40 tablet 2  . folic acid (FOLVITE) 1 MG tablet TAKE 1 TABLET BY MOUTH EVERY DAY 90 tablet 0  . HYDROcodone-acetaminophen (NORCO/VICODIN) 5-325 MG tablet TAKE 1 TABLET BY MOUTH 3 TIMES A DAY FOR 5 DAYS AS NEEDED FOR PAIN    . Multiple Vitamin (MULTIVITAMIN WITH MINERALS) TABS tablet Take 1 tablet by mouth every morning.    . ondansetron (ZOFRAN) 8 MG tablet TAKE 1 TABLET BY MOUTH BEFORE CHEMO 30 tablet 0  . OVER THE COUNTER MEDICATION Take 1 tablet by mouth every morning. (Vitamin A)    . prochlorperazine (COMPAZINE) 10 MG tablet Take 1  tablet (10 mg total) by mouth every 6 (six) hours as needed for nausea or vomiting. (Patient not taking: Reported on 09/09/2019) 60 tablet 0  . rosuvastatin (CRESTOR) 10 MG tablet Take 1 tablet (10 mg total) by mouth daily. 30 tablet 12  . senna-docusate (SENOKOT-S) 8.6-50 MG tablet Take 1 tablet by mouth daily. 30 tablet prn  . valACYclovir (VALTREX) 1000 MG tablet Take 1,000 mg by mouth 3 (three) times daily.    . Vitamin D, Ergocalciferol, (DRISDOL) 1.25 MG (50000 UNIT) CAPS  capsule Take 50,000 Units by mouth once a week.     No current facility-administered medications for this visit.    SURGICAL HISTORY:  Past Surgical History:  Procedure Laterality Date  . ABDOMINAL HYSTERECTOMY    . COLOSTOMY TAKEDOWN N/A 07/10/2014   Procedure: LAPAROSCOPIC LYSIS OF ADHESIONS (90 MIN) LAPAROSCOPIC ASSISTED COLOSTOMY CLOSURE, RIGID PROCTOSIGMOIDOSCOPY;  Surgeon: Jackolyn Confer, MD;  Location: [facility];  Service: General;  Laterality: N/A;  . LAPAROTOMY N/A 11/03/2013   Procedure: EXPLORATORY LAPAROTOMY, DRAINAGE OF INTRA  ABDOMINAL ABSCESSES, MOBILIZATION OF SPLENIC FLEXURE, SIGMOID COLECTOMY WITH COLOSTOMY;  Surgeon: Odis Hollingshead, MD;  Location: [facility];  Service: General;  Laterality: N/A;  . VIDEO BRONCHOSCOPY Bilateral 08/30/2013   Procedure: VIDEO BRONCHOSCOPY WITH FLUORO;  Surgeon: Tanda Rockers, MD;  Location: [facility];  Service: Cardiopulmonary;  Laterality: Bilateral;    REVIEW OF SYSTEMS:   Review of Systems  Constitutional: Positive for fatigue. Negative for appetite change, chills, fever and unexpected weight change.  HENT: Negative for mouth sores, nosebleeds, sore throat and trouble swallowing.  Eyes: Negative for eye problems and icterus.  Respiratory: Negative for cough, hemoptysis, shortness of breath and wheezing.   Cardiovascular: Negative for chest pain and leg swelling.  Gastrointestinal: Positive for mild constipation successfully managed with stool softener. Negative for abdominal pain, diarrhea, nausea and vomiting.  Genitourinary: Negative for bladder incontinence, difficulty urinating, dysuria, frequency and hematuria.   Musculoskeletal: Negative for back pain, gait problem, neck pain and neck stiffness.  Skin: Negative for itching and rash.  Neurological: Negative for dizziness, extremity weakness, gait problem, headaches, light-headedness and seizures.  Hematological: Negative for adenopathy. Does not bruise/bleed easily.   Psychiatric/Behavioral: Negative for confusion, depression and sleep disturbance. The patient is not nervous/anxious.     PHYSICAL EXAMINATION:  There were no vitals taken for this visit.  ECOG PERFORMANCE STATUS: 1 - Symptomatic but completely ambulatory  Physical Exam  Constitutional: Oriented to person, place, and time and well-developed, well-nourished, and in no distress.  HENT:  Head: Normocephalic and atraumatic.  Mouth/Throat: Oropharynx is clear and moist. No oropharyngeal exudate.  Eyes: Conjunctivae are normal. Right eye exhibits no discharge. Left eye exhibits no discharge. No scleral icterus.  Neck: Normal range of motion. Neck supple.  Cardiovascular: Normal rate, regular rhythm, normal heart sounds and intact distal pulses.   Pulmonary/Chest: Effort normal and breath sounds normal. No respiratory distress. No wheezes. No rales.  Abdominal: Soft. Bowel sounds are normal. Exhibits no distension and no mass. There is no tenderness.  Musculoskeletal: Normal range of motion. Exhibits no edema.  Lymphadenopathy:    No cervical adenopathy.  Neurological: Alert and oriented to person, place, and time. Exhibits normal muscle tone. Gait normal. Coordination normal.  Skin: Skin is warm and dry. No rash noted. Not diaphoretic. No erythema. No pallor.  Psychiatric: Mood, memory and judgment normal.  Vitals reviewed.  LABORATORY DATA: Lab Results  Component Value Date   WBC 15.4 (H) 10/02/2019  HGB 12.3 10/02/2019   HCT 39.1 10/02/2019   MCV 101.3 (H) 10/02/2019   PLT 437 (H) 10/02/2019      Chemistry      Component Value Date/Time   NA 139 10/02/2019 1408   NA 139 08/14/2017 0837   K 3.9 10/02/2019 1408   K 4.4 08/14/2017 0837   CL 102 10/02/2019 1408   CO2 26 10/02/2019 1408   CO2 24 08/14/2017 0837   BUN 21 10/02/2019 1408   BUN 13.3 08/14/2017 0837   CREATININE 0.85 10/02/2019 1408   CREATININE 0.8 08/14/2017 0837      Component Value Date/Time   CALCIUM  9.0 10/02/2019 1408   CALCIUM 9.3 08/14/2017 0837   ALKPHOS 88 10/02/2019 1408   ALKPHOS 107 08/14/2017 0837   AST 11 (L) 10/02/2019 1408   AST 12 08/14/2017 0837   ALT 16 10/02/2019 1408   ALT 12 08/14/2017 0837   BILITOT 0.3 10/02/2019 1408   BILITOT 0.37 08/14/2017 0837       RADIOGRAPHIC STUDIES:  No results found.   ASSESSMENT/PLAN:  This isa very pleasant 69 year old Caucasian female with stage IV non-small cell lung cancer, adenocarcinoma who presented with a right upper lobe lungmass in addition to a solitary brain metastatsis. She was diagnosed in January 2015. The patient had completed induction systemic chemotherapy with carboplatin and Alimta with a partial response. The patient is currently being treated with single agentmaintenanceAlimta. She is status post93cycles.  Labs were reviewedwith the patient.Irecommend she proceed with cycle #94 today as scheduled.  We will see her back for a follow up visit in 3 weeks for evaluation before starting cycle #95.  She will continue to take her decadron the day before, the day of, and the day after chemotherapy.  The patient was advised to call immediately if she has any concerning symptoms in the interval. The patient voices understanding of current disease status and treatment options and is in agreement with the current care plan. All questions were answered. The patient knows to call the clinic with any problems, questions or concerns. We can certainly see the patient much sooner if necessary.   No orders of the defined types were placed in this encounter.    Emmett Arntz L Nicholas Trompeter, PA-C 10/21/19  ADDENDUM: Hematology/Oncology Attending: I had a face-to-face encounter with the patient today.  I recommended her care plan.  This is a very pleasant 69 years old white female with a stage IV non-small cell lung cancer, adenocarcinoma diagnosed in January 2015 status post induction systemic chemotherapy  with carboplatin and Alimta with partial response and she is currently undergoing maintenance treatment with single agent Alimta status post 93 cycles.  The patient has been tolerating this treatment well with no concerning adverse effects. I recommended for her to proceed with cycle #94 today as planned. I will see the patient back for follow-up visit in 3 weeks for evaluation. She will continue her current treatment with folic acid and Decadron as planned. The patient was advised to call immediately if she has any concerning symptoms in the interval.  Disclaimer: This note was dictated with voice recognition software. Similar sounding words can inadvertently be transcribed and may be missed upon review. Eilleen Kempf, MD 10/22/19

## 2019-10-22 ENCOUNTER — Inpatient Hospital Stay (HOSPITAL_BASED_OUTPATIENT_CLINIC_OR_DEPARTMENT_OTHER): Payer: Medicare Other | Admitting: Physician Assistant

## 2019-10-22 ENCOUNTER — Inpatient Hospital Stay: Payer: Medicare Other

## 2019-10-22 ENCOUNTER — Encounter: Payer: Self-pay | Admitting: Physician Assistant

## 2019-10-22 ENCOUNTER — Other Ambulatory Visit: Payer: Self-pay

## 2019-10-22 VITALS — BP 139/72 | HR 100 | Temp 98.2°F | Resp 20 | Ht 66.0 in | Wt 179.5 lb

## 2019-10-22 DIAGNOSIS — C3411 Malignant neoplasm of upper lobe, right bronchus or lung: Secondary | ICD-10-CM

## 2019-10-22 DIAGNOSIS — Z5111 Encounter for antineoplastic chemotherapy: Secondary | ICD-10-CM | POA: Diagnosis not present

## 2019-10-22 DIAGNOSIS — Z5112 Encounter for antineoplastic immunotherapy: Secondary | ICD-10-CM | POA: Diagnosis not present

## 2019-10-22 DIAGNOSIS — C7931 Secondary malignant neoplasm of brain: Secondary | ICD-10-CM | POA: Diagnosis not present

## 2019-10-22 DIAGNOSIS — Z79899 Other long term (current) drug therapy: Secondary | ICD-10-CM | POA: Diagnosis not present

## 2019-10-22 DIAGNOSIS — Z9221 Personal history of antineoplastic chemotherapy: Secondary | ICD-10-CM | POA: Diagnosis not present

## 2019-10-22 DIAGNOSIS — Z923 Personal history of irradiation: Secondary | ICD-10-CM | POA: Diagnosis not present

## 2019-10-22 LAB — CBC WITH DIFFERENTIAL (CANCER CENTER ONLY)
Abs Immature Granulocytes: 0.09 10*3/uL — ABNORMAL HIGH (ref 0.00–0.07)
Basophils Absolute: 0 10*3/uL (ref 0.0–0.1)
Basophils Relative: 0 %
Eosinophils Absolute: 0 10*3/uL (ref 0.0–0.5)
Eosinophils Relative: 0 %
HCT: 37 % (ref 36.0–46.0)
Hemoglobin: 11.8 g/dL — ABNORMAL LOW (ref 12.0–15.0)
Immature Granulocytes: 1 %
Lymphocytes Relative: 3 %
Lymphs Abs: 0.5 10*3/uL — ABNORMAL LOW (ref 0.7–4.0)
MCH: 31.6 pg (ref 26.0–34.0)
MCHC: 31.9 g/dL (ref 30.0–36.0)
MCV: 99.2 fL (ref 80.0–100.0)
Monocytes Absolute: 0.9 10*3/uL (ref 0.1–1.0)
Monocytes Relative: 7 %
Neutro Abs: 12.1 10*3/uL — ABNORMAL HIGH (ref 1.7–7.7)
Neutrophils Relative %: 89 %
Platelet Count: 273 10*3/uL (ref 150–400)
RBC: 3.73 MIL/uL — ABNORMAL LOW (ref 3.87–5.11)
RDW: 14.5 % (ref 11.5–15.5)
WBC Count: 13.5 10*3/uL — ABNORMAL HIGH (ref 4.0–10.5)
nRBC: 0 % (ref 0.0–0.2)

## 2019-10-22 LAB — CMP (CANCER CENTER ONLY)
ALT: 17 U/L (ref 0–44)
AST: 11 U/L — ABNORMAL LOW (ref 15–41)
Albumin: 3.6 g/dL (ref 3.5–5.0)
Alkaline Phosphatase: 96 U/L (ref 38–126)
Anion gap: 10 (ref 5–15)
BUN: 16 mg/dL (ref 8–23)
CO2: 23 mmol/L (ref 22–32)
Calcium: 9.5 mg/dL (ref 8.9–10.3)
Chloride: 105 mmol/L (ref 98–111)
Creatinine: 0.8 mg/dL (ref 0.44–1.00)
GFR, Est AFR Am: >60 mL/min (ref >60)
GFR, Estimated: >60 mL/min (ref >60)
Glucose, Bld: 223 mg/dL — ABNORMAL HIGH (ref 70–99)
Potassium: 4.3 mmol/L (ref 3.5–5.1)
Sodium: 138 mmol/L (ref 135–145)
Total Bilirubin: 0.4 mg/dL (ref 0.3–1.2)
Total Protein: 7 g/dL (ref 6.5–8.1)

## 2019-10-22 MED ORDER — DEXAMETHASONE SODIUM PHOSPHATE 10 MG/ML IJ SOLN
10.0000 mg | Freq: Once | INTRAMUSCULAR | Status: AC
  Administered 2019-10-22: 10 mg via INTRAVENOUS

## 2019-10-22 MED ORDER — SODIUM CHLORIDE 0.9 % IV SOLN
Freq: Once | INTRAVENOUS | Status: AC
  Filled 2019-10-22: qty 250

## 2019-10-22 MED ORDER — DEXAMETHASONE SODIUM PHOSPHATE 10 MG/ML IJ SOLN
INTRAMUSCULAR | Status: AC
  Filled 2019-10-22: qty 1

## 2019-10-22 MED ORDER — SODIUM CHLORIDE 0.9 % IV SOLN
500.0000 mg/m2 | Freq: Once | INTRAVENOUS | Status: AC
  Administered 2019-10-22: 900 mg via INTRAVENOUS
  Filled 2019-10-22: qty 20

## 2019-10-22 NOTE — Patient Instructions (Signed)
[facility] Discharge Instructions for Patients Receiving Chemotherapy  Today you received the following chemotherapy agents: Alimta   To help prevent nausea and vomiting after your treatment, we encourage you to take your nausea medication as directed.    If you develop nausea and vomiting that is not controlled by your nausea medication, call the clinic.   BELOW ARE SYMPTOMS THAT SHOULD BE REPORTED IMMEDIATELY:  *FEVER GREATER THAN 100.5 F  *CHILLS WITH OR WITHOUT FEVER  NAUSEA AND VOMITING THAT IS NOT CONTROLLED WITH YOUR NAUSEA MEDICATION  *UNUSUAL SHORTNESS OF BREATH  *UNUSUAL BRUISING OR BLEEDING  TENDERNESS IN MOUTH AND THROAT WITH OR WITHOUT PRESENCE OF ULCERS  *URINARY PROBLEMS  *BOWEL PROBLEMS  UNUSUAL RASH Items with * indicate a potential emergency and should be followed up as soon as possible.  Feel free to call the clinic should you have any questions or concerns. The clinic phone number is [phone].  Please show the [facility] at check-in to the Emergency Department and triage nurse.

## 2019-10-28 DIAGNOSIS — Z23 Encounter for immunization: Secondary | ICD-10-CM | POA: Diagnosis not present

## 2019-11-09 NOTE — Progress Notes (Signed)
[facility] OFFICE PROGRESS NOTE  Alexis [name], James, MD [address] [place] Alexis [name] [address]  DIAGNOSIS: Stage IV (T2a, N0, M1b) non-small cell lung cancer, adenocarcinoma with negative EGFR and ALK mutations diagnosed in January 2015 and presented with right upper lobe lung mass in addition to a solitary brain metastasis  PRIOR THERAPY:  1) Status post stereotactic radiotherapy to a solitary right parietal brain lesion under the care of Dr. Moody on 10/16/2013. 2) Status post palliative radiotherapy to the right lung tumor under the care of Dr. Moody completed on 12/05/2013. 3) Systemic chemotherapy with carboplatin for AUC of 5 and Alimta 500 mg/M2 every 3 weeks. First dose Jan 06 2014. Status post 6 cycles.  CURRENT THERAPY: Systemic chemotherapy with maintenance Alimta 500 MG/M2 every 3 weeks, status post94cycles.  INTERVAL HISTORY: Alexis [name] 69 y.o. female returns to the clinic for a follow up visit. The patient is feeling well today without any concerning complaints. She recently received her first COVID-19 vaccine. The patient continues to tolerate treatment with single agent Alimta well without any adverse effects except flushing from the steroids. Denies any fever, chills, night sweats, or weight loss. Denies any chest pain, shortness of breath, cough, or hemoptysis. Denies any nausea, vomiting, or diarrhea. She has mild constipation after chemotherapy which is managed successfully with a stool softener.  Denies any headache or visual changes.  The patient is here today for evaluation prior to starting cycle # 95.  MEDICAL HISTORY: Past Medical History:  Diagnosis Date  . Anxiety   . Anxiety 06/20/2016  . Cervical cancer ([facility])   . Encounter for antineoplastic chemotherapy 07/20/2015  . 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  MEDICATIONS:  Current  Outpatient Medications  Medication Sig Dispense Refill  . acetaminophen (TYLENOL) 500 MG tablet Take 500 mg by mouth every 6 (six) hours as needed for mild pain or headache. Reported on 11/02/2015    . ALPRAZolam (XANAX) 0.25 MG tablet Take 1 tablet (0.25 mg total) by mouth 3 (three) times daily as needed. 30 tablet 0  . Ascorbic Acid (VITAMIN C GUMMIE PO) Take 1 each by mouth every morning.    . aspirin EC 81 MG tablet Take 1 tablet (81 mg total) by mouth daily. 150 tablet 2  . [employer] ACID CONTROLLER 10 MG tablet SMARTSIG:1 Tablet(s) By Mouth Every 12 Hours PRN    . dexamethasone (DECADRON) 4 MG tablet Take 1 pill twice a day the day before, the day of, and the day after chemotherapy. 40 tablet 2  . folic acid (FOLVITE) 1 MG tablet TAKE 1 TABLET BY MOUTH EVERY DAY 90 tablet 0  . HYDROcodone-acetaminophen (NORCO/VICODIN) 5-325 MG tablet TAKE 1 TABLET BY MOUTH 3 TIMES A DAY FOR 5 DAYS AS NEEDED FOR PAIN    . Multiple Vitamin (MULTIVITAMIN WITH MINERALS) TABS tablet Take 1 tablet by mouth every morning.    . ondansetron (ZOFRAN) 8 MG tablet TAKE 1 TABLET BY MOUTH BEFORE CHEMO 30 tablet 0  . OVER THE COUNTER MEDICATION Take 1 tablet by mouth every morning. (Vitamin A)    . prochlorperazine (COMPAZINE) 10 MG tablet Take 1 tablet (10 mg total) by mouth every 6 (six) hours as needed for nausea or vomiting. 60 tablet 0  . rosuvastatin (CRESTOR) 10 MG tablet Take 1 tablet (10 mg total) by mouth   daily. 30 tablet 12  . senna-docusate (SENOKOT-S) 8.6-50 MG tablet Take 1 tablet by mouth daily. 30 tablet prn  . valACYclovir (VALTREX) 1000 MG tablet Take 1,000 mg by mouth 3 (three) times daily.    . Vitamin D, Ergocalciferol, (DRISDOL) 1.25 MG (50000 UNIT) CAPS capsule Take 50,000 Units by mouth once a week.     No current facility-administered medications for this visit.    SURGICAL HISTORY:  Past Surgical History:  Procedure Laterality Date  . ABDOMINAL HYSTERECTOMY    . COLOSTOMY TAKEDOWN N/A 07/10/2014    Procedure: LAPAROSCOPIC LYSIS OF ADHESIONS (90 MIN) LAPAROSCOPIC ASSISTED COLOSTOMY CLOSURE, RIGID PROCTOSIGMOIDOSCOPY;  Surgeon: Jackolyn Confer, MD;  Location: [facility];  Service: General;  Laterality: N/A;  . LAPAROTOMY N/A 11/03/2013   Procedure: EXPLORATORY LAPAROTOMY, DRAINAGE OF INTRA  ABDOMINAL ABSCESSES, MOBILIZATION OF SPLENIC FLEXURE, SIGMOID COLECTOMY WITH COLOSTOMY;  Surgeon: Odis Hollingshead, MD;  Location: [facility];  Service: General;  Laterality: N/A;  . VIDEO BRONCHOSCOPY Bilateral 08/30/2013   Procedure: VIDEO BRONCHOSCOPY WITH FLUORO;  Surgeon: Tanda Rockers, MD;  Location: [facility];  Service: Cardiopulmonary;  Laterality: Bilateral;    REVIEW OF SYSTEMS:   Review of Systems  Constitutional:Positive for fatigue.Negative for appetite change, chills, fever and unexpected weight change.  HENT: Negative for mouth sores, nosebleeds, sore throat and trouble swallowing.  Eyes: Negative for eye problems and icterus.  Respiratory: Negative for cough, hemoptysis, shortness of breath and wheezing.  Cardiovascular: Negative for chest pain and leg swelling.  Gastrointestinal:Positive for mild constipation successfully managed with stool softener.Negative for abdominal pain, diarrhea, nausea and vomiting.  Genitourinary: Negative for bladder incontinence, difficulty urinating, dysuria, frequency and hematuria.  Musculoskeletal: Negative for back pain, gait problem, neck pain and neck stiffness.  Skin: Negative for itching and rash.  Neurological: Negative for dizziness, extremity weakness, gait problem, headaches, light-headedness and seizures.  Hematological: Negative for adenopathy. Does not bruise/bleed easily.  Psychiatric/Behavioral: Negative for confusion, depression and sleep disturbance. The patient is not nervous/anxious.      PHYSICAL EXAMINATION:  There were no vitals taken for this visit.  ECOG PERFORMANCE STATUS: 1 - Symptomatic but completely ambulatory  Physical  Exam  Constitutional: Oriented to person, place, and time and well-developed, well-nourished, and in no distress.  HENT:  Head: Normocephalic and atraumatic.  Mouth/Throat: Oropharynx is clear and moist. No oropharyngeal exudate.  Eyes: Conjunctivae are normal. Right eye exhibits no discharge. Left eye exhibits no discharge. No scleral icterus.  Neck: Normal range of motion. Neck supple.  Cardiovascular: Normal rate, regular rhythm, normal heart sounds and intact distal pulses.  Pulmonary/Chest: Effort normal and breath sounds normal. No respiratory distress. No wheezes. No rales.  Abdominal: Soft. Bowel sounds are normal. Exhibits no distension and no mass. There is no tenderness.  Musculoskeletal: Normal range of motion. Exhibits no edema.  Lymphadenopathy:  No cervical adenopathy.  Neurological: Alert and oriented to person, place, and time. Exhibits normal muscle tone. Gait normal. Coordination normal.  Skin: Skin is warm and dry. No rash noted. Not diaphoretic. No erythema. No pallor.  Psychiatric: Mood, memory and judgment normal.  Vitals reviewed.  LABORATORY DATA: Lab Results  Component Value Date   WBC 13.5 (H) 10/22/2019   HGB 11.8 (L) 10/22/2019   HCT 37.0 10/22/2019   MCV 99.2 10/22/2019   PLT 273 10/22/2019      Chemistry      Component Value Date/Time   NA 138 10/22/2019 0956   NA 139 08/14/2017 0837   K  4.3 10/22/2019 0956   K 4.4 08/14/2017 0837   CL 105 10/22/2019 0956   CO2 23 10/22/2019 0956   CO2 24 08/14/2017 0837   BUN 16 10/22/2019 0956   BUN 13.3 08/14/2017 0837   CREATININE 0.80 10/22/2019 0956   CREATININE 0.8 08/14/2017 0837      Component Value Date/Time   CALCIUM 9.5 10/22/2019 0956   CALCIUM 9.3 08/14/2017 0837   ALKPHOS 96 10/22/2019 0956   ALKPHOS 107 08/14/2017 0837   AST 11 (L) 10/22/2019 0956   AST 12 08/14/2017 0837   ALT 17 10/22/2019 0956   ALT 12 08/14/2017 0837   BILITOT 0.4 10/22/2019 0956   BILITOT 0.37 08/14/2017 0837        RADIOGRAPHIC STUDIES:  No results found.   ASSESSMENT/PLAN:  This isa very pleasant 69 year old Caucasian female with stage IV non-small cell lung cancer, adenocarcinoma who presented with a right upper lobe lungmass in addition to a solitary brain metastatsis. She was diagnosed in January 2015. The patient had completed induction systemic chemotherapy with carboplatin and Alimta with a partial response. The patient is currently being treated with single agentmaintenanceAlimta. She is status post94cycles.  Labs were reviewedwith the patient.Irecommend she proceed with cycle #95today as scheduled.  I will arrange for a restaging CT scan prior to starting cycle #96.   We will see her back for a follow up visit in 3 weeks for evaluation and to review her scan before starting cycle #96.  She will continue to take her decadron the day before, the day of, and the day after chemotherapy.  The patient was advised to call immediately if she has any concerning symptoms in the interval. The patient voices understanding of current disease status and treatment options and is in agreement with the current care plan. All questions were answered. The patient knows to call the clinic with any problems, questions or concerns. We can certainly see the patient much sooner if necessary.   No orders of the defined types were placed in this encounter.    Tiearra Colwell L Radford Pease, PA-C 11/09/19

## 2019-11-11 ENCOUNTER — Inpatient Hospital Stay: Payer: Medicare Other | Attending: Internal Medicine

## 2019-11-11 ENCOUNTER — Other Ambulatory Visit: Payer: Self-pay

## 2019-11-11 ENCOUNTER — Inpatient Hospital Stay (HOSPITAL_BASED_OUTPATIENT_CLINIC_OR_DEPARTMENT_OTHER): Payer: Medicare Other | Admitting: Physician Assistant

## 2019-11-11 ENCOUNTER — Inpatient Hospital Stay: Payer: Medicare Other

## 2019-11-11 VITALS — BP 138/76 | HR 108 | Temp 98.2°F | Resp 20 | Ht 66.0 in | Wt 181.5 lb

## 2019-11-11 VITALS — HR 96

## 2019-11-11 DIAGNOSIS — C3411 Malignant neoplasm of upper lobe, right bronchus or lung: Secondary | ICD-10-CM

## 2019-11-11 DIAGNOSIS — Z79899 Other long term (current) drug therapy: Secondary | ICD-10-CM | POA: Insufficient documentation

## 2019-11-11 DIAGNOSIS — Z9221 Personal history of antineoplastic chemotherapy: Secondary | ICD-10-CM | POA: Insufficient documentation

## 2019-11-11 DIAGNOSIS — Z923 Personal history of irradiation: Secondary | ICD-10-CM | POA: Insufficient documentation

## 2019-11-11 DIAGNOSIS — C7931 Secondary malignant neoplasm of brain: Secondary | ICD-10-CM | POA: Insufficient documentation

## 2019-11-11 DIAGNOSIS — Z5111 Encounter for antineoplastic chemotherapy: Secondary | ICD-10-CM

## 2019-11-11 DIAGNOSIS — Z5112 Encounter for antineoplastic immunotherapy: Secondary | ICD-10-CM | POA: Insufficient documentation

## 2019-11-11 LAB — CBC WITH DIFFERENTIAL (CANCER CENTER ONLY)
Abs Immature Granulocytes: 0.1 10*3/uL — ABNORMAL HIGH (ref 0.00–0.07)
Basophils Absolute: 0 10*3/uL (ref 0.0–0.1)
Basophils Relative: 0 %
Eosinophils Absolute: 0 10*3/uL (ref 0.0–0.5)
Eosinophils Relative: 0 %
HCT: 38.4 % (ref 36.0–46.0)
Hemoglobin: 12.2 g/dL (ref 12.0–15.0)
Immature Granulocytes: 1 %
Lymphocytes Relative: 5 %
Lymphs Abs: 0.6 10*3/uL — ABNORMAL LOW (ref 0.7–4.0)
MCH: 31.1 pg (ref 26.0–34.0)
MCHC: 31.8 g/dL (ref 30.0–36.0)
MCV: 98 fL (ref 80.0–100.0)
Monocytes Absolute: 0.8 10*3/uL (ref 0.1–1.0)
Monocytes Relative: 7 %
Neutro Abs: 10.9 10*3/uL — ABNORMAL HIGH (ref 1.7–7.7)
Neutrophils Relative %: 87 %
Platelet Count: 255 10*3/uL (ref 150–400)
RBC: 3.92 MIL/uL (ref 3.87–5.11)
RDW: 15.3 % (ref 11.5–15.5)
WBC Count: 12.4 10*3/uL — ABNORMAL HIGH (ref 4.0–10.5)
nRBC: 0 % (ref 0.0–0.2)

## 2019-11-11 LAB — CMP (CANCER CENTER ONLY)
ALT: 18 U/L (ref 0–44)
AST: 13 U/L — ABNORMAL LOW (ref 15–41)
Albumin: 3.4 g/dL — ABNORMAL LOW (ref 3.5–5.0)
Alkaline Phosphatase: 106 U/L (ref 38–126)
Anion gap: 12 (ref 5–15)
BUN: 16 mg/dL (ref 8–23)
CO2: 23 mmol/L (ref 22–32)
Calcium: 9.8 mg/dL (ref 8.9–10.3)
Chloride: 105 mmol/L (ref 98–111)
Creatinine: 0.91 mg/dL (ref 0.44–1.00)
GFR, Est AFR Am: >60 mL/min (ref >60)
GFR, Estimated: >60 mL/min (ref >60)
Glucose, Bld: 259 mg/dL — ABNORMAL HIGH (ref 70–99)
Potassium: 4.4 mmol/L (ref 3.5–5.1)
Sodium: 140 mmol/L (ref 135–145)
Total Bilirubin: 0.4 mg/dL (ref 0.3–1.2)
Total Protein: 6.9 g/dL (ref 6.5–8.1)

## 2019-11-11 MED ORDER — CYANOCOBALAMIN 1000 MCG/ML IJ SOLN
1000.0000 ug | Freq: Once | INTRAMUSCULAR | Status: AC
  Administered 2019-11-11: 1000 ug via INTRAMUSCULAR

## 2019-11-11 MED ORDER — DEXAMETHASONE SODIUM PHOSPHATE 10 MG/ML IJ SOLN
INTRAMUSCULAR | Status: AC
  Filled 2019-11-11: qty 1

## 2019-11-11 MED ORDER — SODIUM CHLORIDE 0.9 % IV SOLN
Freq: Once | INTRAVENOUS | Status: AC
  Filled 2019-11-11: qty 250

## 2019-11-11 MED ORDER — CYANOCOBALAMIN 1000 MCG/ML IJ SOLN
INTRAMUSCULAR | Status: AC
  Filled 2019-11-11: qty 1

## 2019-11-11 MED ORDER — DEXAMETHASONE SODIUM PHOSPHATE 10 MG/ML IJ SOLN
10.0000 mg | Freq: Once | INTRAMUSCULAR | Status: AC
  Administered 2019-11-11: 10 mg via INTRAVENOUS

## 2019-11-11 MED ORDER — SODIUM CHLORIDE 0.9 % IV SOLN
500.0000 mg/m2 | Freq: Once | INTRAVENOUS | Status: AC
  Administered 2019-11-11: 900 mg via INTRAVENOUS
  Filled 2019-11-11: qty 16

## 2019-11-11 NOTE — Patient Instructions (Signed)
[facility] Discharge Instructions for Patients Receiving Chemotherapy  Today you received the following chemotherapy agents: pemetrexed.   To help prevent nausea and vomiting after your treatment, we encourage you to take your nausea medication as directed.   If you develop nausea and vomiting that is not controlled by your nausea medication, call the clinic.   BELOW ARE SYMPTOMS THAT SHOULD BE REPORTED IMMEDIATELY:  *FEVER GREATER THAN 100.5 F  *CHILLS WITH OR WITHOUT FEVER  NAUSEA AND VOMITING THAT IS NOT CONTROLLED WITH YOUR NAUSEA MEDICATION  *UNUSUAL SHORTNESS OF BREATH  *UNUSUAL BRUISING OR BLEEDING  TENDERNESS IN MOUTH AND THROAT WITH OR WITHOUT PRESENCE OF ULCERS  *URINARY PROBLEMS  *BOWEL PROBLEMS  UNUSUAL RASH Items with * indicate a potential emergency and should be followed up as soon as possible.  Feel free to call the clinic should you have any questions or concerns. The clinic phone number is [phone].  Please show the [facility] at check-in to the Emergency Department and triage nurse.

## 2019-11-13 ENCOUNTER — Telehealth: Payer: Self-pay | Admitting: Physician Assistant

## 2019-11-13 NOTE — Telephone Encounter (Signed)
Scheduled per los. Called and left msg. Mailed printout  °

## 2019-11-21 ENCOUNTER — Other Ambulatory Visit: Payer: Self-pay | Admitting: Radiation Therapy

## 2019-11-21 DIAGNOSIS — C7931 Secondary malignant neoplasm of brain: Secondary | ICD-10-CM

## 2019-11-26 DIAGNOSIS — Z23 Encounter for immunization: Secondary | ICD-10-CM | POA: Diagnosis not present

## 2019-11-26 NOTE — Progress Notes (Signed)
Pharmacist Chemotherapy Monitoring - Follow Up Assessment    I verify that I have reviewed each item in the below checklist:  . Regimen for the patient is scheduled for the appropriate day and plan matches scheduled date. Marland Kitchen Appropriate non-routine labs are ordered dependent on drug ordered. . If applicable, additional medications reviewed and ordered per protocol based on lifetime cumulative doses and/or treatment regimen.   Plan for follow-up and/or issues identified: No . I-vent associated with next due treatment: No . MD and/or nursing notified: No  Acquanetta Belling 11/26/2019 12:02 PM

## 2019-11-27 ENCOUNTER — Telehealth: Payer: Self-pay | Admitting: Radiation Therapy

## 2019-11-27 ENCOUNTER — Other Ambulatory Visit: Payer: Self-pay | Admitting: Radiation Therapy

## 2019-11-27 NOTE — Telephone Encounter (Signed)
Spoke with Alexis [name] about her upcoming brain MRI and virtual follow-up with Ash[facility] in May.   Mont Dutton R.T.(R)(T) Radiation Special Procedures Navigator

## 2019-11-29 ENCOUNTER — Ambulatory Visit
Admission: RE | Admit: 2019-11-29 | Discharge: 2019-11-29 | Disposition: A | Discharge status: Home / Self Care | Payer: Medicare Other | Source: Ambulatory Visit | Attending: Physician Assistant | Admitting: Physician Assistant

## 2019-11-29 ENCOUNTER — Other Ambulatory Visit: Payer: Medicare Other

## 2019-11-29 DIAGNOSIS — C3411 Malignant neoplasm of upper lobe, right bronchus or lung: Secondary | ICD-10-CM

## 2019-11-29 MED ORDER — IOPAMIDOL (ISOVUE-300) INJECTION 61%
100.0000 mL | Freq: Once | INTRAVENOUS | Status: AC | PRN
  Administered 2019-11-29: 100 mL via INTRAVENOUS

## 2019-11-30 NOTE — Progress Notes (Signed)
[facility] OFFICE PROGRESS NOTE  [name], MD [address] [place] [address]  DIAGNOSIS: Stage IV (T2a, N0, M1b) non-small cell lung cancer, adenocarcinoma with negative EGFR and ALK mutations diagnosed in January 2015 and presented with right upper lobe lung mass in addition to a solitary brain metastasis  PRIOR THERAPY: 1) Status post stereotactic radiotherapy to a solitary right parietal brain lesion under the care of Dr. [name] on 10/16/2013. 2) Status post palliative radiotherapy to the right lung tumor under the care of Dr. [name] completed on 12/05/2013. 3) Systemic chemotherapy with carboplatin for AUC of 5 and Alimta 500 mg/M2 every 3 weeks. First dose Jan 06 2014. Status post 6 cycles.  CURRENT THERAPY: Systemic chemotherapy with maintenance Alimta 500 MG/M2 every 3 weeks, status post95cycles.  INTERVAL HISTORY: Alexis [name] 69 y.o. female returns to the clinic for a follow up visit. The patient is feeling well today without any concerning complaints. She recently received her second COVID-19 vaccine. The patient continues to tolerate treatment with single agent Alimta well without any adverse effects except flushing from the steroids. Denies any fever, chills, night sweats, or weight loss. Denies any chest pain, shortness of breath, cough, or hemoptysis. Denies any nausea, vomiting, or diarrhea. She has mild constipation after chemotherapy which is managed successfully with a stool softener.  Denies any headache or visual changes. She is scheduled for a routine follow up MRI on 5/13 for her history of metastatic disease to the brain. She recently had a restaging CT scan performed. The patient is here today for evaluation and to review her scan prior to starting cycle # 95.   MEDICAL HISTORY: Past Medical History:  Diagnosis Date  . Anxiety   . Anxiety 06/20/2016  . Cervical cancer ([facility])   . Encounter for antineoplastic chemotherapy 07/20/2015  .  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  MEDICATIONS:  Current Outpatient Medications  Medication Sig Dispense Refill  . acetaminophen (TYLENOL) 500 MG tablet Take 500 mg by mouth every 6 (six) hours as needed for mild pain or headache. Reported on 11/02/2015    . ALPRAZolam (XANAX) 0.25 MG tablet Take 1 tablet (0.25 mg total) by mouth 3 (three) times daily as needed. 30 tablet 0  . Ascorbic Acid (VITAMIN C GUMMIE PO) Take 1 each by mouth every morning.    Marland Kitchen aspirin EC 81 MG tablet Take 1 tablet (81 mg total) by mouth daily. 150 tablet 2  . [employer] ACID CONTROLLER 10 MG tablet SMARTSIG:1 Tablet(s) By Mouth Every 12 Hours PRN    . dexamethasone (DECADRON) 4 MG tablet Take 1 pill twice a day the day before, the day of, and the day after chemotherapy. 40 tablet 2  . folic acid (FOLVITE) 1 MG tablet TAKE 1 TABLET BY MOUTH EVERY DAY 90 tablet 0  . HYDROcodone-acetaminophen (NORCO/VICODIN) 5-325 MG tablet TAKE 1 TABLET BY MOUTH 3 TIMES A DAY FOR 5 DAYS AS NEEDED FOR PAIN    . Multiple Vitamin (MULTIVITAMIN WITH MINERALS) TABS tablet Take 1 tablet by mouth every morning.    . ondansetron (ZOFRAN) 8 MG tablet TAKE 1 TABLET BY MOUTH BEFORE CHEMO 30 tablet 0  . OVER THE COUNTER MEDICATION Take 1 tablet by mouth every morning. (Vitamin A)    . prochlorperazine (COMPAZINE) 10 MG tablet Take 1 tablet (10  mg total) by mouth every 6 (six) hours as needed for nausea or vomiting. 60 tablet 0  . rosuvastatin (CRESTOR) 10 MG tablet Take 1 tablet (10 mg total) by mouth daily. 30 tablet 12  . senna-docusate (SENOKOT-S) 8.6-50 MG tablet Take 1 tablet by mouth daily. 30 tablet prn  . valACYclovir (VALTREX) 1000 MG tablet Take 1,000 mg by mouth 3 (three) times daily.    . Vitamin D, Ergocalciferol, (DRISDOL) 1.25 MG (50000 UNIT) CAPS capsule Take 50,000 Units by mouth once a week.     No current  facility-administered medications for this visit.    SURGICAL HISTORY:  Past Surgical History:  Procedure Laterality Date  . ABDOMINAL HYSTERECTOMY    . COLOSTOMY TAKEDOWN N/A 07/10/2014   Procedure: LAPAROSCOPIC LYSIS OF ADHESIONS (90 MIN) LAPAROSCOPIC ASSISTED COLOSTOMY CLOSURE, RIGID PROCTOSIGMOIDOSCOPY;  Surgeon: Jackolyn Confer, MD;  Location: [facility];  Service: General;  Laterality: N/A;  . LAPAROTOMY N/A 11/03/2013   Procedure: EXPLORATORY LAPAROTOMY, DRAINAGE OF INTRA  ABDOMINAL ABSCESSES, MOBILIZATION OF SPLENIC FLEXURE, SIGMOID COLECTOMY WITH COLOSTOMY;  Surgeon: Odis Hollingshead, MD;  Location: [facility];  Service: General;  Laterality: N/A;  . VIDEO BRONCHOSCOPY Bilateral 08/30/2013   Procedure: VIDEO BRONCHOSCOPY WITH FLUORO;  Surgeon: Tanda Rockers, MD;  Location: [facility];  Service: Cardiopulmonary;  Laterality: Bilateral;    REVIEW OF SYSTEMS:   Review of Systems  Constitutional: Negative for appetite change, chills, fatigue, fever and unexpected weight change.  HENT:   Negative for mouth sores, nosebleeds, sore throat and trouble swallowing.   Eyes: Negative for eye problems and icterus.  Respiratory: Negative for cough, hemoptysis, shortness of breath and wheezing.  Cardiovascular: Negative for chest pain and leg swelling.  Gastrointestinal:Positive for mild constipation successfully managed with stool softener.Negative for abdominal pain, diarrhea, nausea and vomiting. Genitourinary: Negative for bladder incontinence, difficulty urinating, dysuria, frequency and hematuria.   Musculoskeletal: Negative for back pain, gait problem, neck pain and neck stiffness.  Skin: Negative for itching and rash.  Neurological: Negative for dizziness, extremity weakness, gait problem, headaches, light-headedness and seizures.  Hematological: Negative for adenopathy. Does not bruise/bleed easily.  Psychiatric/Behavioral: Negative for confusion, depression and sleep disturbance. The  patient is not nervous/anxious.     PHYSICAL EXAMINATION:  There were no vitals taken for this visit.  ECOG PERFORMANCE STATUS: 1 - Symptomatic but completely ambulatory  Physical Exam  Constitutional: Oriented to person, place, and time and well-developed, well-nourished, and in no distress.  HENT:  Head: Normocephalic and atraumatic.  Mouth/Throat: Oropharynx is clear and moist. No oropharyngeal exudate.  Eyes: Conjunctivae are normal. Right eye exhibits no discharge. Left eye exhibits no discharge. No scleral icterus.  Neck: Normal range of motion. Neck supple.  Cardiovascular: Normal rate, regular rhythm, normal heart sounds and intact distal pulses.   Pulmonary/Chest: Effort normal and breath sounds normal. No respiratory distress. No wheezes. No rales.  Abdominal: Soft. Bowel sounds are normal. Exhibits no distension and no mass. There is no tenderness.  Musculoskeletal: Normal range of motion. Exhibits no edema.  Lymphadenopathy:    No cervical adenopathy.  Neurological: Alert and oriented to person, place, and time. Exhibits normal muscle tone. Gait normal. Coordination normal.  Skin: Skin is warm and dry. No rash noted. Not diaphoretic. No erythema. No pallor.  Psychiatric: Mood, memory and judgment normal.  Vitals reviewed.  LABORATORY DATA: Lab Results  Component Value Date   WBC 12.4 (H) 11/11/2019   HGB 12.2 11/11/2019   HCT 38.4 11/11/2019   MCV  98.0 11/11/2019   PLT 255 11/11/2019      Chemistry      Component Value Date/Time   NA 140 11/11/2019 0805   NA 139 08/14/2017 0837   K 4.4 11/11/2019 0805   K 4.4 08/14/2017 0837   CL 105 11/11/2019 0805   CO2 23 11/11/2019 0805   CO2 24 08/14/2017 0837   BUN 16 11/11/2019 0805   BUN 13.3 08/14/2017 0837   CREATININE 0.91 11/11/2019 0805   CREATININE 0.8 08/14/2017 0837      Component Value Date/Time   CALCIUM 9.8 11/11/2019 0805   CALCIUM 9.3 08/14/2017 0837   ALKPHOS 106 11/11/2019 0805   ALKPHOS 107  08/14/2017 0837   AST 13 (L) 11/11/2019 0805   AST 12 08/14/2017 0837   ALT 18 11/11/2019 0805   ALT 12 08/14/2017 0837   BILITOT 0.4 11/11/2019 0805   BILITOT 0.37 08/14/2017 0837       RADIOGRAPHIC STUDIES:  CT Chest W Contrast  Result Date: 11/29/2019 CLINICAL DATA:  Metastatic right upper lobe lung cancer. Restaging. EXAM: CT CHEST, ABDOMEN, AND PELVIS WITH CONTRAST TECHNIQUE: Multidetector CT imaging of the chest, abdomen and pelvis was performed following the standard protocol during bolus administration of intravenous contrast. CONTRAST:  191m ISOVUE-300 IOPAMIDOL (ISOVUE-300) INJECTION 61% COMPARISON:  09/06/2019 FINDINGS: CT CHEST FINDINGS Cardiovascular: The heart size is normal. No substantial pericardial effusion. Atherosclerotic calcification is noted in the wall of the thoracic aorta. Mediastinum/Nodes: No mediastinal lymphadenopathy. There is no hilar lymphadenopathy. The esophagus has normal imaging features. There is no axillary lymphadenopathy. Lungs/Pleura: Centrilobular and paraseptal emphysema evident. Scarring in the medial right upper lung is compatible with radiation fibrosis and unchanged in the interval. There is some apical scarring in the left upper lobe, stable. 6 mm nodular ground-glass opacity identified posterior right lower lobe on 87/4 with similar 10 mm ground-glass opacity in the right lower lobe seen on 90/4. No pleural effusion. Musculoskeletal: No worrisome lytic or sclerotic osseous abnormality. CT ABDOMEN PELVIS FINDINGS Hepatobiliary: No suspicious focal abnormality within the liver parenchyma. There is no evidence for gallstones, gallbladder wall thickening, or pericholecystic fluid. No intrahepatic or extrahepatic biliary dilation. Pancreas: No focal mass lesion. No dilatation of the main duct. No intraparenchymal cyst. No peripancreatic edema. Spleen: No splenomegaly. No focal mass lesion. Adrenals/Urinary Tract: No adrenal nodule or mass. 9 mm hypodensity  in the upper pole left kidney (11/7) is stable and likely a cyst. No evidence for hydroureter. The urinary bladder appears normal for the degree of distention. Stomach/Bowel: Stomach is unremarkable. No gastric wall thickening. No evidence of outlet obstruction. Duodenum is normally positioned as is the ligament of Treitz. No small bowel wall thickening. No small bowel dilatation. The terminal ileum is normal. The appendix is normal. No gross colonic mass. No colonic wall thickening. Anastomotic staple line noted rectosigmoid junction region. Vascular/Lymphatic: There is abdominal aortic atherosclerosis without aneurysm. There is no gastrohepatic or hepatoduodenal ligament lymphadenopathy. No retroperitoneal or mesenteric lymphadenopathy. No pelvic sidewall lymphadenopathy. Reproductive: Uterus surgically absent.  There is no adnexal mass. Other: No intraperitoneal free fluid. Musculoskeletal: No worrisome lytic or sclerotic osseous abnormality. IMPRESSION: 1. Stable post treatment scarring medial right upper lung. No evidence for local recurrence. No new or findings to suggest metastatic disease in the chest, abdomen, or pelvis. 2. New small ground-glass opacities in the right lower lobe, potentially infectious/inflammatory. Attention on follow-up recommended. 3.  Emphysema (ICD10-J43.9) and Aortic Atherosclerosis (ICD10-170.0) Electronically Signed   By: EVerda CuminsD.  On: 11/29/2019 11:14   CT Abdomen Pelvis W Contrast  Result Date: 11/29/2019 CLINICAL DATA:  Metastatic right upper lobe lung cancer. Restaging. EXAM: CT CHEST, ABDOMEN, AND PELVIS WITH CONTRAST TECHNIQUE: Multidetector CT imaging of the chest, abdomen and pelvis was performed following the standard protocol during bolus administration of intravenous contrast. CONTRAST:  167m ISOVUE-300 IOPAMIDOL (ISOVUE-300) INJECTION 61% COMPARISON:  09/06/2019 FINDINGS: CT CHEST FINDINGS Cardiovascular: The heart size is normal. No substantial  pericardial effusion. Atherosclerotic calcification is noted in the wall of the thoracic aorta. Mediastinum/Nodes: No mediastinal lymphadenopathy. There is no hilar lymphadenopathy. The esophagus has normal imaging features. There is no axillary lymphadenopathy. Lungs/Pleura: Centrilobular and paraseptal emphysema evident. Scarring in the medial right upper lung is compatible with radiation fibrosis and unchanged in the interval. There is some apical scarring in the left upper lobe, stable. 6 mm nodular ground-glass opacity identified posterior right lower lobe on 87/4 with similar 10 mm ground-glass opacity in the right lower lobe seen on 90/4. No pleural effusion. Musculoskeletal: No worrisome lytic or sclerotic osseous abnormality. CT ABDOMEN PELVIS FINDINGS Hepatobiliary: No suspicious focal abnormality within the liver parenchyma. There is no evidence for gallstones, gallbladder wall thickening, or pericholecystic fluid. No intrahepatic or extrahepatic biliary dilation. Pancreas: No focal mass lesion. No dilatation of the main duct. No intraparenchymal cyst. No peripancreatic edema. Spleen: No splenomegaly. No focal mass lesion. Adrenals/Urinary Tract: No adrenal nodule or mass. 9 mm hypodensity in the upper pole left kidney (11/7) is stable and likely a cyst. No evidence for hydroureter. The urinary bladder appears normal for the degree of distention. Stomach/Bowel: Stomach is unremarkable. No gastric wall thickening. No evidence of outlet obstruction. Duodenum is normally positioned as is the ligament of Treitz. No small bowel wall thickening. No small bowel dilatation. The terminal ileum is normal. The appendix is normal. No gross colonic mass. No colonic wall thickening. Anastomotic staple line noted rectosigmoid junction region. Vascular/Lymphatic: There is abdominal aortic atherosclerosis without aneurysm. There is no gastrohepatic or hepatoduodenal ligament lymphadenopathy. No retroperitoneal or  mesenteric lymphadenopathy. No pelvic sidewall lymphadenopathy. Reproductive: Uterus surgically absent.  There is no adnexal mass. Other: No intraperitoneal free fluid. Musculoskeletal: No worrisome lytic or sclerotic osseous abnormality. IMPRESSION: 1. Stable post treatment scarring medial right upper lung. No evidence for local recurrence. No new or findings to suggest metastatic disease in the chest, abdomen, or pelvis. 2. New small ground-glass opacities in the right lower lobe, potentially infectious/inflammatory. Attention on follow-up recommended. 3.  Emphysema (ICD10-J43.9) and Aortic Atherosclerosis (ICD10-170.0) Electronically Signed   By: EMisty StanleyM.D.   On: 11/29/2019 11:14     ASSESSMENT/PLAN:  This isa very pleasant 69year old Caucasian female with stage IV non-small cell lung cancer, adenocarcinoma who presented with a right upper lobe lungmass in addition to a solitary brain metastatsis. She was diagnosed in January 2015. The patient had completed induction systemic chemotherapy with carboplatin and Alimta with a partial response. The patient is currently being treated with single agentmaintenanceAlimta. She is status post95cycles.  The patient recently had a restaging CT scan performed. Dr. MJulien Nordmannpersonally and independently reviewed the scan and discussed the results with the patient. The scan did not show any evidence of disease progression. Dr. MJulien Nordmannrecommends that she continue on the same treatment at the same dose. She will receive cycle #96 today as scheduled.   We will see her back for a follow up visit in 3 weeks for evaluation before starting cycle #97.   She will continue to  take her decadron the day before, the day of, and the day after chemotherapy.  The patient was advised to call immediately if she has any concerning symptoms in the interval. The patient voices understanding of current disease status and treatment options and is in agreement with the  current care plan. All questions were answered. The patient knows to call the clinic with any problems, questions or concerns. We can certainly see the patient much sooner if necessary  No orders of the defined types were placed in this encounter.    Kery Batzel L Mirza Fessel, PA-C 11/30/19  ADDENDUM: Hematology/Oncology Attending: I had a face-to-face encounter with the patient today.  I recommended her care plan.  This is a very pleasant 69 years old white female with metastatic non-small cell lung cancer, adenocarcinoma diagnosed in January 2015 status post induction systemic chemotherapy with carboplatin and Alimta and she is currently on maintenance treatment with single agent Alimta status post 95 cycles.  The patient has been tolerating this treatment well with no significant adverse effects. She had repeat CT scan of the chest, abdomen and pelvis performed recently.  I personally and independently reviewed the scans and discussed the results with the patient today. Her scan showed no concerning findings for disease progression. I recommended for the patient to continue her current maintenance treatment and she will proceed with cycle #96 today as planned. She will come back for follow-up visit in 3 weeks for evaluation before the next cycle of her treatment. For anxiety she will continue on Xanax on as needed. The patient was advised to call immediately if she has any concerning symptoms in the interval.  Disclaimer: This note was dictated with voice recognition software. Similar sounding words can inadvertently be transcribed and may be missed upon review. Eilleen Kempf, MD 12/02/19

## 2019-12-02 ENCOUNTER — Inpatient Hospital Stay: Payer: Medicare Other

## 2019-12-02 ENCOUNTER — Inpatient Hospital Stay (HOSPITAL_BASED_OUTPATIENT_CLINIC_OR_DEPARTMENT_OTHER): Payer: Medicare Other | Admitting: Physician Assistant

## 2019-12-02 ENCOUNTER — Other Ambulatory Visit: Payer: Self-pay

## 2019-12-02 ENCOUNTER — Encounter: Payer: Self-pay | Admitting: Physician Assistant

## 2019-12-02 ENCOUNTER — Inpatient Hospital Stay: Payer: Medicare Other | Attending: Internal Medicine

## 2019-12-02 ENCOUNTER — Telehealth: Payer: Self-pay | Admitting: Physician Assistant

## 2019-12-02 VITALS — HR 100

## 2019-12-02 VITALS — BP 117/64 | HR 108 | Temp 98.7°F | Resp 18 | Ht 66.0 in | Wt 184.3 lb

## 2019-12-02 DIAGNOSIS — Z9221 Personal history of antineoplastic chemotherapy: Secondary | ICD-10-CM | POA: Diagnosis not present

## 2019-12-02 DIAGNOSIS — C3411 Malignant neoplasm of upper lobe, right bronchus or lung: Secondary | ICD-10-CM | POA: Diagnosis not present

## 2019-12-02 DIAGNOSIS — Z923 Personal history of irradiation: Secondary | ICD-10-CM | POA: Insufficient documentation

## 2019-12-02 DIAGNOSIS — Z79899 Other long term (current) drug therapy: Secondary | ICD-10-CM | POA: Insufficient documentation

## 2019-12-02 DIAGNOSIS — C7931 Secondary malignant neoplasm of brain: Secondary | ICD-10-CM | POA: Diagnosis not present

## 2019-12-02 DIAGNOSIS — Z5111 Encounter for antineoplastic chemotherapy: Secondary | ICD-10-CM | POA: Diagnosis not present

## 2019-12-02 DIAGNOSIS — Z5112 Encounter for antineoplastic immunotherapy: Secondary | ICD-10-CM | POA: Diagnosis not present

## 2019-12-02 LAB — CBC WITH DIFFERENTIAL (CANCER CENTER ONLY)
Abs Immature Granulocytes: 0.01 10*3/uL (ref 0.00–0.07)
Basophils Absolute: 0 10*3/uL (ref 0.0–0.1)
Basophils Relative: 1 %
Eosinophils Absolute: 0.2 10*3/uL (ref 0.0–0.5)
Eosinophils Relative: 5 %
HCT: 37.3 % (ref 36.0–46.0)
Hemoglobin: 11.8 g/dL — ABNORMAL LOW (ref 12.0–15.0)
Immature Granulocytes: 0 %
Lymphocytes Relative: 20 %
Lymphs Abs: 1 10*3/uL (ref 0.7–4.0)
MCH: 32.1 pg (ref 26.0–34.0)
MCHC: 31.6 g/dL (ref 30.0–36.0)
MCV: 101.4 fL — ABNORMAL HIGH (ref 80.0–100.0)
Monocytes Absolute: 0.9 10*3/uL (ref 0.1–1.0)
Monocytes Relative: 19 %
Neutro Abs: 2.7 10*3/uL (ref 1.7–7.7)
Neutrophils Relative %: 55 %
Platelet Count: 264 10*3/uL (ref 150–400)
RBC: 3.68 MIL/uL — ABNORMAL LOW (ref 3.87–5.11)
RDW: 15.4 % (ref 11.5–15.5)
WBC Count: 4.8 10*3/uL (ref 4.0–10.5)
nRBC: 0 % (ref 0.0–0.2)

## 2019-12-02 LAB — CMP (CANCER CENTER ONLY)
ALT: 16 U/L (ref 0–44)
AST: 17 U/L (ref 15–41)
Albumin: 3.2 g/dL — ABNORMAL LOW (ref 3.5–5.0)
Alkaline Phosphatase: 87 U/L (ref 38–126)
Anion gap: 12 (ref 5–15)
BUN: 9 mg/dL (ref 8–23)
CO2: 23 mmol/L (ref 22–32)
Calcium: 9 mg/dL (ref 8.9–10.3)
Chloride: 107 mmol/L (ref 98–111)
Creatinine: 0.75 mg/dL (ref 0.44–1.00)
GFR, Est AFR Am: >60 mL/min (ref >60)
GFR, Estimated: >60 mL/min (ref >60)
Glucose, Bld: 148 mg/dL — ABNORMAL HIGH (ref 70–99)
Potassium: 3.8 mmol/L (ref 3.5–5.1)
Sodium: 142 mmol/L (ref 135–145)
Total Bilirubin: 0.4 mg/dL (ref 0.3–1.2)
Total Protein: 6.3 g/dL — ABNORMAL LOW (ref 6.5–8.1)

## 2019-12-02 MED ORDER — SODIUM CHLORIDE 0.9 % IV SOLN
10.0000 mg | Freq: Once | INTRAVENOUS | Status: AC
  Administered 2019-12-02: 10:00:00 10 mg via INTRAVENOUS
  Filled 2019-12-02: qty 10

## 2019-12-02 MED ORDER — SODIUM CHLORIDE 0.9 % IV SOLN
Freq: Once | INTRAVENOUS | Status: AC
  Filled 2019-12-02: qty 250

## 2019-12-02 MED ORDER — SODIUM CHLORIDE 0.9 % IV SOLN
500.0000 mg/m2 | Freq: Once | INTRAVENOUS | Status: AC
  Administered 2019-12-02: 900 mg via INTRAVENOUS
  Filled 2019-12-02: qty 20

## 2019-12-02 NOTE — Patient Instructions (Signed)
[facility] Discharge Instructions for Patients Receiving Chemotherapy  Today you received the following chemotherapy agents: Alimta.  To help prevent nausea and vomiting after your treatment, we encourage you to take your nausea medication as directed.   If you develop nausea and vomiting that is not controlled by your nausea medication, call the clinic.   BELOW ARE SYMPTOMS THAT SHOULD BE REPORTED IMMEDIATELY:  *FEVER GREATER THAN 100.5 F  *CHILLS WITH OR WITHOUT FEVER  NAUSEA AND VOMITING THAT IS NOT CONTROLLED WITH YOUR NAUSEA MEDICATION  *UNUSUAL SHORTNESS OF BREATH  *UNUSUAL BRUISING OR BLEEDING  TENDERNESS IN MOUTH AND THROAT WITH OR WITHOUT PRESENCE OF ULCERS  *URINARY PROBLEMS  *BOWEL PROBLEMS  UNUSUAL RASH Items with * indicate a potential emergency and should be followed up as soon as possible.  Feel free to call the clinic should you have any questions or concerns. The clinic phone number is [phone].  Please show the [facility] at check-in to the Emergency Department and triage nurse.

## 2019-12-02 NOTE — Telephone Encounter (Signed)
Scheduled per los. Gave avs and calendar  

## 2019-12-08 ENCOUNTER — Other Ambulatory Visit: Payer: Self-pay | Admitting: Internal Medicine

## 2019-12-08 DIAGNOSIS — C3490 Malignant neoplasm of unspecified part of unspecified bronchus or lung: Secondary | ICD-10-CM

## 2019-12-17 NOTE — Progress Notes (Signed)
Pharmacist Chemotherapy Monitoring - Follow Up Assessment    I verify that I have reviewed each item in the below checklist:  . Regimen for the patient is scheduled for the appropriate day and plan matches scheduled date. Marland Kitchen Appropriate non-routine labs are ordered dependent on drug ordered. . If applicable, additional medications reviewed and ordered per protocol based on lifetime cumulative doses and/or treatment regimen.   Plan for follow-up and/or issues identified: No . I-vent associated with next due treatment: No . MD and/or nursing notified: No  Philomena Course 12/17/2019 11:21 AM

## 2019-12-23 ENCOUNTER — Encounter: Payer: Self-pay | Admitting: *Deleted

## 2019-12-23 ENCOUNTER — Telehealth: Payer: Self-pay | Admitting: Internal Medicine

## 2019-12-23 ENCOUNTER — Encounter: Payer: Self-pay | Admitting: Internal Medicine

## 2019-12-23 ENCOUNTER — Inpatient Hospital Stay: Payer: Medicare Other

## 2019-12-23 ENCOUNTER — Inpatient Hospital Stay (HOSPITAL_BASED_OUTPATIENT_CLINIC_OR_DEPARTMENT_OTHER): Payer: Medicare Other | Admitting: Internal Medicine

## 2019-12-23 ENCOUNTER — Other Ambulatory Visit: Payer: Self-pay

## 2019-12-23 VITALS — BP 140/67 | HR 119 | Temp 98.5°F | Resp 20 | Ht 66.0 in | Wt 182.5 lb

## 2019-12-23 VITALS — HR 97

## 2019-12-23 DIAGNOSIS — Z5111 Encounter for antineoplastic chemotherapy: Secondary | ICD-10-CM

## 2019-12-23 DIAGNOSIS — C3411 Malignant neoplasm of upper lobe, right bronchus or lung: Secondary | ICD-10-CM

## 2019-12-23 DIAGNOSIS — Z923 Personal history of irradiation: Secondary | ICD-10-CM | POA: Diagnosis not present

## 2019-12-23 DIAGNOSIS — Z5112 Encounter for antineoplastic immunotherapy: Secondary | ICD-10-CM | POA: Diagnosis not present

## 2019-12-23 DIAGNOSIS — Z79899 Other long term (current) drug therapy: Secondary | ICD-10-CM | POA: Diagnosis not present

## 2019-12-23 DIAGNOSIS — C7931 Secondary malignant neoplasm of brain: Secondary | ICD-10-CM | POA: Diagnosis not present

## 2019-12-23 DIAGNOSIS — Z9221 Personal history of antineoplastic chemotherapy: Secondary | ICD-10-CM | POA: Diagnosis not present

## 2019-12-23 LAB — CBC WITH DIFFERENTIAL/PLATELET
Abs Immature Granulocytes: 0.04 10*3/uL (ref 0.00–0.07)
Basophils Absolute: 0 10*3/uL (ref 0.0–0.1)
Basophils Relative: 0 %
Eosinophils Absolute: 0 10*3/uL (ref 0.0–0.5)
Eosinophils Relative: 0 %
HCT: 39.7 % (ref 36.0–46.0)
Hemoglobin: 12.7 g/dL (ref 12.0–15.0)
Immature Granulocytes: 1 %
Lymphocytes Relative: 6 %
Lymphs Abs: 0.5 10*3/uL — ABNORMAL LOW (ref 0.7–4.0)
MCH: 32 pg (ref 26.0–34.0)
MCHC: 32 g/dL (ref 30.0–36.0)
MCV: 100 fL (ref 80.0–100.0)
Monocytes Absolute: 0.3 10*3/uL (ref 0.1–1.0)
Monocytes Relative: 4 %
Neutro Abs: 7.2 10*3/uL (ref 1.7–7.7)
Neutrophils Relative %: 89 %
Platelets: 279 10*3/uL (ref 150–400)
RBC: 3.97 MIL/uL (ref 3.87–5.11)
RDW: 15.4 % (ref 11.5–15.5)
WBC: 8 10*3/uL (ref 4.0–10.5)
nRBC: 0 % (ref 0.0–0.2)

## 2019-12-23 LAB — COMPREHENSIVE METABOLIC PANEL
ALT: 21 U/L (ref 0–44)
AST: 16 U/L (ref 15–41)
Albumin: 3.6 g/dL (ref 3.5–5.0)
Alkaline Phosphatase: 97 U/L (ref 38–126)
Anion gap: 14 (ref 5–15)
BUN: 17 mg/dL (ref 8–23)
CO2: 21 mmol/L — ABNORMAL LOW (ref 22–32)
Calcium: 9.6 mg/dL (ref 8.9–10.3)
Chloride: 106 mmol/L (ref 98–111)
Creatinine, Ser: 0.88 mg/dL (ref 0.44–1.00)
GFR calc Af Amer: >60 mL/min (ref >60)
GFR calc non Af Amer: >60 mL/min (ref >60)
Glucose, Bld: 267 mg/dL — ABNORMAL HIGH (ref 70–99)
Potassium: 4.2 mmol/L (ref 3.5–5.1)
Sodium: 141 mmol/L (ref 135–145)
Total Bilirubin: 0.5 mg/dL (ref 0.3–1.2)
Total Protein: 7.3 g/dL (ref 6.5–8.1)

## 2019-12-23 MED ORDER — SODIUM CHLORIDE 0.9 % IV SOLN
10.0000 mg | Freq: Once | INTRAVENOUS | Status: AC
  Administered 2019-12-23: 10 mg via INTRAVENOUS
  Filled 2019-12-23: qty 10

## 2019-12-23 MED ORDER — SODIUM CHLORIDE 0.9 % IV SOLN
Freq: Once | INTRAVENOUS | Status: AC
  Filled 2019-12-23: qty 250

## 2019-12-23 MED ORDER — SODIUM CHLORIDE 0.9 % IV SOLN
500.0000 mg/m2 | Freq: Once | INTRAVENOUS | Status: AC
  Administered 2019-12-23: 900 mg via INTRAVENOUS
  Filled 2019-12-23: qty 20

## 2019-12-23 MED ORDER — SODIUM CHLORIDE 0.9% FLUSH
10.0000 mL | INTRAVENOUS | Status: DC | PRN
  Filled 2019-12-23: qty 10

## 2019-12-23 NOTE — Progress Notes (Signed)
Oncology Nurse Navigator Documentation  Oncology Nurse Navigator Flowsheets 12/23/2019  Navigator Location [facility]  Navigator Encounter Type Clinic/[facility]C  Telephone -  Patient Visit Type MedOnc  Treatment Phase Treatment  Barriers/Navigation Needs No Barriers At This Time  Education -  Interventions Psycho-Social Support  Acuity Level 2-Minimal Needs (1-2 Barriers Identified)  Coordination of Care -  Education Method Verbal  Support Groups/Services -  Time Spent with Patient 15  Time Spent with Patient (Retired) -

## 2019-12-23 NOTE — Progress Notes (Signed)
[place] [phone]   [phone]  OFFICE PROGRESS NOTE  [name], MD [address] [place] [address]  DIAGNOSIS: Stage IV (T2a, N0, M1b) non-small cell lung cancer, adenocarcinoma with negative EGFR and ALK mutations diagnosed in January 2015 and presented with right upper lobe lung mass in addition to a solitary brain metastasis.  PRIOR THERAPY: 1) Status post stereotactic radiotherapy to a solitary right parietal brain lesion under the care of Dr. [name] on 10/16/2013. 2) Status post palliative radiotherapy to the right lung tumor under the care of Dr. [name] completed on 12/05/2013. 3) Systemic chemotherapy with carboplatin for AUC of 5 and Alimta 500 mg/M2 every 3 weeks. First dose Jan 06 2014. Status post 6 cycles.  CURRENT THERAPY: Systemic chemotherapy with maintenance Alimta 500 MG/M2 every 3 weeks, status post 96 cycles.  INTERVAL HISTORY: Alexis [name] 69 y.o. female returns to the clinic today for follow-up visit.  The patient is feeling fine today with no concerning complaints.  She continues to tolerate her maintenance treatment with Alimta fairly well.  She denied having any current chest pain, shortness of breath, cough or hemoptysis.  She denied having any fever or chills.  She has no nausea, vomiting, diarrhea or constipation.  She denied having any headache or visual changes.  She is here today for evaluation before starting cycle #97.  MEDICAL HISTORY: Past Medical History:  Diagnosis Date  . Anxiety   . Anxiety 06/20/2016  . Cervical cancer ([place])   . Encounter for antineoplastic chemotherapy 07/20/2015  . 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  MEDICATIONS:  Current Outpatient Medications  Medication Sig Dispense Refill  . acetaminophen (TYLENOL) 500 MG tablet Take 500 mg by mouth every 6 (six)  hours as needed for mild pain or headache. Reported on 11/02/2015    . ALPRAZolam (XANAX) 1 MG tablet SMARTSIG:1 Tablet(s) By Mouth 1 to 3 Times Daily PRN    . Ascorbic Acid (VITAMIN C GUMMIE PO) Take 1 each by mouth every morning.    Marland Kitchen aspirin EC 81 MG tablet Take 1 tablet (81 mg total) by mouth daily. 150 tablet 2  . [employer] ACID CONTROLLER 10 MG tablet SMARTSIG:1 Tablet(s) By Mouth Every 12 Hours PRN    . dexamethasone (DECADRON) 4 MG tablet Take 1 pill twice a day the day before, the day of, and the day after chemotherapy. 40 tablet 2  . folic acid (FOLVITE) 1 MG tablet TAKE 1 TABLET BY MOUTH EVERY DAY 90 tablet 0  . HYDROcodone-acetaminophen (NORCO/VICODIN) 5-325 MG tablet TAKE 1 TABLET BY MOUTH 3 TIMES A DAY FOR 5 DAYS AS NEEDED FOR PAIN    . Multiple Vitamin (MULTIVITAMIN WITH MINERALS) TABS tablet Take 1 tablet by mouth every morning.    . ondansetron (ZOFRAN) 8 MG tablet TAKE 1 TABLET BY MOUTH BEFORE CHEMO (Patient not taking: Reported on 12/02/2019) 30 tablet 0  . OVER THE COUNTER MEDICATION Take 1 tablet by mouth every morning. (Vitamin A)    . prochlorperazine (COMPAZINE) 10 MG tablet Take 1 tablet (10 mg total) by mouth every 6 (six) hours as needed for nausea or vomiting. (Patient not taking: Reported on 12/02/2019) 60 tablet 0  . rosuvastatin (CRESTOR) 10 MG tablet Take 1 tablet (10 mg total) by mouth daily. 30 tablet 12  .  senna-docusate (SENOKOT-S) 8.6-50 MG tablet Take 1 tablet by mouth daily. 30 tablet prn  . valACYclovir (VALTREX) 1000 MG tablet Take 1,000 mg by mouth 3 (three) times daily.    . Vitamin D, Ergocalciferol, (DRISDOL) 1.25 MG (50000 UNIT) CAPS capsule Take 50,000 Units by mouth once a week.     No current facility-administered medications for this visit.   Facility-Administered Medications Ordered in Other Visits  Medication Dose Route Frequency Provider Last Rate Last Admin  . sodium chloride flush (NS) 0.9 % injection 10 mL  10 mL Intravenous PRN Curt Bears, MD         SURGICAL HISTORY:  Past Surgical History:  Procedure Laterality Date  . ABDOMINAL HYSTERECTOMY    . COLOSTOMY TAKEDOWN N/A 07/10/2014   Procedure: LAPAROSCOPIC LYSIS OF ADHESIONS (90 MIN) LAPAROSCOPIC ASSISTED COLOSTOMY CLOSURE, RIGID PROCTOSIGMOIDOSCOPY;  Surgeon: Jackolyn Confer, MD;  Location: [facility];  Service: General;  Laterality: N/A;  . LAPAROTOMY N/A 11/03/2013   Procedure: EXPLORATORY LAPAROTOMY, DRAINAGE OF INTRA  ABDOMINAL ABSCESSES, MOBILIZATION OF SPLENIC FLEXURE, SIGMOID COLECTOMY WITH COLOSTOMY;  Surgeon: Odis Hollingshead, MD;  Location: [facility];  Service: General;  Laterality: N/A;  . VIDEO BRONCHOSCOPY Bilateral 08/30/2013   Procedure: VIDEO BRONCHOSCOPY WITH FLUORO;  Surgeon: Tanda Rockers, MD;  Location: [facility];  Service: Cardiopulmonary;  Laterality: Bilateral;    REVIEW OF SYSTEMS:  A comprehensive review of systems was negative.   PHYSICAL EXAMINATION: General appearance: alert, cooperative and no distress Head: Normocephalic, without obvious abnormality, atraumatic Neck: no adenopathy, no JVD, supple, symmetrical, trachea midline and thyroid not enlarged, symmetric, no tenderness/mass/nodules Lymph nodes: Cervical, supraclavicular, and axillary nodes normal. Resp: clear to auscultation bilaterally Back: symmetric, no curvature. ROM normal. No CVA tenderness. Cardio: regular rate and rhythm, S1, S2 normal, no murmur, click, rub or gallop GI: soft, non-tender; bowel sounds normal; no masses,  no organomegaly Extremities: extremities normal, atraumatic, no cyanosis or edema   ECOG PERFORMANCE STATUS: 1 - Symptomatic but completely ambulatory  Blood pressure 140/67, pulse (!) 119, temperature 98.5 F (36.9 C), temperature source Oral, resp. rate 20, height 5' 6"  (1.676 m), weight 182 lb 8 oz (82.8 kg), SpO2 100 %.  LABORATORY DATA: Lab Results  Component Value Date   WBC 8.0 12/23/2019   HGB 12.7 12/23/2019   HCT 39.7 12/23/2019   MCV 100.0 12/23/2019     PLT 279 12/23/2019      Chemistry      Component Value Date/Time   NA 142 12/02/2019 0753   NA 139 08/14/2017 0837   K 3.8 12/02/2019 0753   K 4.4 08/14/2017 0837   CL 107 12/02/2019 0753   CO2 23 12/02/2019 0753   CO2 24 08/14/2017 0837   BUN 9 12/02/2019 0753   BUN 13.3 08/14/2017 0837   CREATININE 0.75 12/02/2019 0753   CREATININE 0.8 08/14/2017 0837      Component Value Date/Time   CALCIUM 9.0 12/02/2019 0753   CALCIUM 9.3 08/14/2017 0837   ALKPHOS 87 12/02/2019 0753   ALKPHOS 107 08/14/2017 0837   AST 17 12/02/2019 0753   AST 12 08/14/2017 0837   ALT 16 12/02/2019 0753   ALT 12 08/14/2017 0837   BILITOT 0.4 12/02/2019 0753   BILITOT 0.37 08/14/2017 0837       RADIOGRAPHIC STUDIES: CT Chest W Contrast  Result Date: 11/29/2019 CLINICAL DATA:  Metastatic right upper lobe lung cancer. Restaging. EXAM: CT CHEST, ABDOMEN, AND PELVIS WITH CONTRAST TECHNIQUE: Multidetector CT imaging of the chest,  abdomen and pelvis was performed following the standard protocol during bolus administration of intravenous contrast. CONTRAST:  185m ISOVUE-300 IOPAMIDOL (ISOVUE-300) INJECTION 61% COMPARISON:  09/06/2019 FINDINGS: CT CHEST FINDINGS Cardiovascular: The heart size is normal. No substantial pericardial effusion. Atherosclerotic calcification is noted in the wall of the thoracic aorta. Mediastinum/Nodes: No mediastinal lymphadenopathy. There is no hilar lymphadenopathy. The esophagus has normal imaging features. There is no axillary lymphadenopathy. Lungs/Pleura: Centrilobular and paraseptal emphysema evident. Scarring in the medial right upper lung is compatible with radiation fibrosis and unchanged in the interval. There is some apical scarring in the left upper lobe, stable. 6 mm nodular ground-glass opacity identified posterior right lower lobe on 87/4 with similar 10 mm ground-glass opacity in the right lower lobe seen on 90/4. No pleural effusion. Musculoskeletal: No worrisome lytic  or sclerotic osseous abnormality. CT ABDOMEN PELVIS FINDINGS Hepatobiliary: No suspicious focal abnormality within the liver parenchyma. There is no evidence for gallstones, gallbladder wall thickening, or pericholecystic fluid. No intrahepatic or extrahepatic biliary dilation. Pancreas: No focal mass lesion. No dilatation of the main duct. No intraparenchymal cyst. No peripancreatic edema. Spleen: No splenomegaly. No focal mass lesion. Adrenals/Urinary Tract: No adrenal nodule or mass. 9 mm hypodensity in the upper pole left kidney (11/7) is stable and likely a cyst. No evidence for hydroureter. The urinary bladder appears normal for the degree of distention. Stomach/Bowel: Stomach is unremarkable. No gastric wall thickening. No evidence of outlet obstruction. Duodenum is normally positioned as is the ligament of Treitz. No small bowel wall thickening. No small bowel dilatation. The terminal ileum is normal. The appendix is normal. No gross colonic mass. No colonic wall thickening. Anastomotic staple line noted rectosigmoid junction region. Vascular/Lymphatic: There is abdominal aortic atherosclerosis without aneurysm. There is no gastrohepatic or hepatoduodenal ligament lymphadenopathy. No retroperitoneal or mesenteric lymphadenopathy. No pelvic sidewall lymphadenopathy. Reproductive: Uterus surgically absent.  There is no adnexal mass. Other: No intraperitoneal free fluid. Musculoskeletal: No worrisome lytic or sclerotic osseous abnormality. IMPRESSION: 1. Stable post treatment scarring medial right upper lung. No evidence for local recurrence. No new or findings to suggest metastatic disease in the chest, abdomen, or pelvis. 2. New small ground-glass opacities in the right lower lobe, potentially infectious/inflammatory. Attention on follow-up recommended. 3.  Emphysema (ICD10-J43.9) and Aortic Atherosclerosis (ICD10-170.0) Electronically Signed   By: EMisty StanleyM.D.   On: 11/29/2019 11:14   CT Abdomen  Pelvis W Contrast  Result Date: 11/29/2019 CLINICAL DATA:  Metastatic right upper lobe lung cancer. Restaging. EXAM: CT CHEST, ABDOMEN, AND PELVIS WITH CONTRAST TECHNIQUE: Multidetector CT imaging of the chest, abdomen and pelvis was performed following the standard protocol during bolus administration of intravenous contrast. CONTRAST:  1059mISOVUE-300 IOPAMIDOL (ISOVUE-300) INJECTION 61% COMPARISON:  09/06/2019 FINDINGS: CT CHEST FINDINGS Cardiovascular: The heart size is normal. No substantial pericardial effusion. Atherosclerotic calcification is noted in the wall of the thoracic aorta. Mediastinum/Nodes: No mediastinal lymphadenopathy. There is no hilar lymphadenopathy. The esophagus has normal imaging features. There is no axillary lymphadenopathy. Lungs/Pleura: Centrilobular and paraseptal emphysema evident. Scarring in the medial right upper lung is compatible with radiation fibrosis and unchanged in the interval. There is some apical scarring in the left upper lobe, stable. 6 mm nodular ground-glass opacity identified posterior right lower lobe on 87/4 with similar 10 mm ground-glass opacity in the right lower lobe seen on 90/4. No pleural effusion. Musculoskeletal: No worrisome lytic or sclerotic osseous abnormality. CT ABDOMEN PELVIS FINDINGS Hepatobiliary: No suspicious focal abnormality within the liver parenchyma. There is  no evidence for gallstones, gallbladder wall thickening, or pericholecystic fluid. No intrahepatic or extrahepatic biliary dilation. Pancreas: No focal mass lesion. No dilatation of the main duct. No intraparenchymal cyst. No peripancreatic edema. Spleen: No splenomegaly. No focal mass lesion. Adrenals/Urinary Tract: No adrenal nodule or mass. 9 mm hypodensity in the upper pole left kidney (11/7) is stable and likely a cyst. No evidence for hydroureter. The urinary bladder appears normal for the degree of distention. Stomach/Bowel: Stomach is unremarkable. No gastric wall  thickening. No evidence of outlet obstruction. Duodenum is normally positioned as is the ligament of Treitz. No small bowel wall thickening. No small bowel dilatation. The terminal ileum is normal. The appendix is normal. No gross colonic mass. No colonic wall thickening. Anastomotic staple line noted rectosigmoid junction region. Vascular/Lymphatic: There is abdominal aortic atherosclerosis without aneurysm. There is no gastrohepatic or hepatoduodenal ligament lymphadenopathy. No retroperitoneal or mesenteric lymphadenopathy. No pelvic sidewall lymphadenopathy. Reproductive: Uterus surgically absent.  There is no adnexal mass. Other: No intraperitoneal free fluid. Musculoskeletal: No worrisome lytic or sclerotic osseous abnormality. IMPRESSION: 1. Stable post treatment scarring medial right upper lung. No evidence for local recurrence. No new or findings to suggest metastatic disease in the chest, abdomen, or pelvis. 2. New small ground-glass opacities in the right lower lobe, potentially infectious/inflammatory. Attention on follow-up recommended. 3.  Emphysema (ICD10-J43.9) and Aortic Atherosclerosis (ICD10-170.0) Electronically Signed   By: Misty Stanley M.D.   On: 11/29/2019 11:14    ASSESSMENT AND PLAN:  This is a very pleasant 69 years old white female with metastatic non-small cell lung cancer, adenocarcinoma status post induction systemic chemotherapy with carboplatin and Alimta with partial response. The patient is currently on maintenance treatment with single agent Alimta status post 96 cycles. The patient continues to tolerate her treatment well with no concerning adverse effects. I recommended for her to proceed with cycle #97 today as planned. I will see her back for follow-up visit in 3 weeks for evaluation before the next cycle of her treatment. For the hypertension, she was advised to monitor her blood pressure closely. The patient was advised to call immediately if she has any concerning  symptoms in the interval. The patient voices understanding of current disease status and treatment options and is in agreement with the current care plan. All questions were answered. The patient knows to call the clinic with any problems, questions or concerns. We can certainly see the patient much sooner if necessary.  Disclaimer: This note was dictated with voice recognition software. Similar sounding words can inadvertently be transcribed and may not be corrected upon review.

## 2019-12-23 NOTE — Telephone Encounter (Signed)
Scheduled per los. Gave avs and calendar  

## 2019-12-23 NOTE — Patient Instructions (Signed)
[facility] Discharge Instructions for Patients Receiving Chemotherapy  Today you received the following chemotherapy agents: Alimta.  To help prevent nausea and vomiting after your treatment, we encourage you to take your nausea medication as directed.   If you develop nausea and vomiting that is not controlled by your nausea medication, call the clinic.   BELOW ARE SYMPTOMS THAT SHOULD BE REPORTED IMMEDIATELY:  *FEVER GREATER THAN 100.5 F  *CHILLS WITH OR WITHOUT FEVER  NAUSEA AND VOMITING THAT IS NOT CONTROLLED WITH YOUR NAUSEA MEDICATION  *UNUSUAL SHORTNESS OF BREATH  *UNUSUAL BRUISING OR BLEEDING  TENDERNESS IN MOUTH AND THROAT WITH OR WITHOUT PRESENCE OF ULCERS  *URINARY PROBLEMS  *BOWEL PROBLEMS  UNUSUAL RASH Items with * indicate a potential emergency and should be followed up as soon as possible.  Feel free to call the clinic should you have any questions or concerns. The clinic phone number is [phone].  Please show the [facility] at check-in to the Emergency Department and triage nurse.

## 2019-12-31 DIAGNOSIS — N390 Urinary tract infection, site not specified: Secondary | ICD-10-CM | POA: Diagnosis not present

## 2019-12-31 DIAGNOSIS — H6123 Impacted cerumen, bilateral: Secondary | ICD-10-CM | POA: Diagnosis not present

## 2019-12-31 DIAGNOSIS — R300 Dysuria: Secondary | ICD-10-CM | POA: Diagnosis not present

## 2019-12-31 DIAGNOSIS — F419 Anxiety disorder, unspecified: Secondary | ICD-10-CM | POA: Diagnosis not present

## 2019-12-31 DIAGNOSIS — R350 Frequency of micturition: Secondary | ICD-10-CM | POA: Diagnosis not present

## 2019-12-31 DIAGNOSIS — R8290 Unspecified abnormal findings in urine: Secondary | ICD-10-CM | POA: Diagnosis not present

## 2019-12-31 DIAGNOSIS — R339 Retention of urine, unspecified: Secondary | ICD-10-CM | POA: Diagnosis not present

## 2020-01-07 NOTE — Progress Notes (Signed)
Pharmacist Chemotherapy Monitoring - Follow Up Assessment    I verify that I have reviewed each item in the below checklist:  . Regimen for the patient is scheduled for the appropriate day and plan matches scheduled date. Marland Kitchen Appropriate non-routine labs are ordered dependent on drug ordered. . If applicable, additional medications reviewed and ordered per protocol based on lifetime cumulative doses and/or treatment regimen.   Plan for follow-up and/or issues identified: No . I-vent associated with next due treatment: No . MD and/or nursing notified: No  Alexis [name] Course 01/07/2020 10:12 AM

## 2020-01-09 ENCOUNTER — Ambulatory Visit
Admission: RE | Admit: 2020-01-09 | Discharge: 2020-01-09 | Disposition: A | Discharge status: Home / Self Care | Payer: Medicare Other | Source: Ambulatory Visit | Attending: Radiation Oncology | Admitting: Radiation Oncology

## 2020-01-09 ENCOUNTER — Other Ambulatory Visit: Payer: Self-pay

## 2020-01-09 DIAGNOSIS — C7949 Secondary malignant neoplasm of other parts of nervous system: Secondary | ICD-10-CM

## 2020-01-09 DIAGNOSIS — C3490 Malignant neoplasm of unspecified part of unspecified bronchus or lung: Secondary | ICD-10-CM | POA: Diagnosis not present

## 2020-01-09 DIAGNOSIS — C7931 Secondary malignant neoplasm of brain: Secondary | ICD-10-CM | POA: Diagnosis not present

## 2020-01-09 MED ORDER — GADOBENATE DIMEGLUMINE 529 MG/ML IV SOLN
18.0000 mL | Freq: Once | INTRAVENOUS | Status: AC | PRN
  Administered 2020-01-09: 18 mL via INTRAVENOUS

## 2020-01-11 NOTE — Progress Notes (Signed)
[facility] OFFICE PROGRESS NOTE  Alexis Roers, MD [address] [place] [address]  DIAGNOSIS: Stage IV (T2a, N0, M1b) non-small cell lung cancer, adenocarcinoma with negative EGFR and ALK mutations diagnosed in January 2015 and presented with right upper lobe lung mass in addition to a solitary brain metastasis  PRIOR THERAPY:  1) Status post stereotactic radiotherapy to a solitary right parietal brain lesion under the care of Dr. [name] on 10/16/2013. 2) Status post palliative radiotherapy to the right lung tumor under the care of Dr. [name] completed on 12/05/2013. 3) Systemic chemotherapy with carboplatin for AUC of 5 and Alimta 500 mg/M2 every 3 weeks. First dose Jan 06 2014. Status post 6 cycles.  CURRENT THERAPY: Systemic chemotherapy with maintenance Alimta 500 MG/M2 every 3 weeks, status post97cycles.  INTERVAL HISTORY: Alexis [name] 69 y.o. female returns to the clinic for a follow up visit. The patient is feeling well today without any concerning complaints except for occasional and persistent catching sensation in her right lateral side of abdomen/lumbar region. This has been present for several years and this resolves spontaneously without any intervention. She states the pain is mild but is curious about the etiology. She has had several abdominal surgeries in the past due to diverticulitis. She denies any known back problems. Otherwise, the patient continues to tolerate treatment with single agent Alimta well without any adverse effects except flushing from the steroids. Denies any fever, chills, night sweats, or weight loss. Denies any chest pain, shortness of breath, cough, or hemoptysis. Denies any nausea, vomiting, or diarrhea. She has mild constipation after chemotherapy which is managed successfully with a stool softener. Denies any headache or visual changes. She recently had a repeat brain MRI by radiation oncology for her history of metastatic disease to the  brain. Her brain MRI was stable.  The patient is here today for evaluation prior to starting cycle # 44   MEDICAL HISTORY: Past Medical History:  Diagnosis Date  . Anxiety   . Anxiety 06/20/2016  . Cervical cancer ([facility])   . Encounter for antineoplastic chemotherapy 07/20/2015  . 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  MEDICATIONS:  Current Outpatient Medications  Medication Sig Dispense Refill  . acetaminophen (TYLENOL) 500 MG tablet Take 500 mg by mouth every 6 (six) hours as needed for mild pain or headache. Reported on 11/02/2015    . ALPRAZolam (XANAX) 1 MG tablet SMARTSIG:1 Tablet(s) By Mouth 1 to 3 Times Daily PRN    . Ascorbic Acid (VITAMIN C GUMMIE PO) Take 1 each by mouth every morning.    Marland Kitchen aspirin EC 81 MG tablet Take 1 tablet (81 mg total) by mouth daily. 150 tablet 2  . [employer] ACID CONTROLLER 10 MG tablet SMARTSIG:1 Tablet(s) By Mouth Every 12 Hours PRN    . dexamethasone (DECADRON) 4 MG tablet Take 1 pill twice a day the day before, the day of, and the day after chemotherapy. 40 tablet 2  . folic acid (FOLVITE) 1 MG tablet TAKE 1 TABLET BY MOUTH EVERY DAY 90 tablet 0  . HYDROcodone-acetaminophen (NORCO/VICODIN) 5-325 MG tablet TAKE 1 TABLET BY MOUTH 3 TIMES A DAY FOR 5 DAYS AS NEEDED FOR PAIN    . Multiple Vitamin (MULTIVITAMIN WITH MINERALS) TABS tablet Take 1 tablet by mouth every morning.    . ondansetron (ZOFRAN) 8  MG tablet TAKE 1 TABLET BY MOUTH BEFORE CHEMO 30 tablet 0  . OVER THE COUNTER MEDICATION Take 1 tablet by mouth every morning. (Vitamin A)    . prochlorperazine (COMPAZINE) 10 MG tablet Take 1 tablet (10 mg total) by mouth every 6 (six) hours as needed for nausea or vomiting. 60 tablet 0  . rosuvastatin (CRESTOR) 10 MG tablet Take 1 tablet (10 mg total) by mouth daily. 30 tablet 12  . senna-docusate (SENOKOT-S) 8.6-50 MG tablet Take 1 tablet by  mouth daily. 30 tablet prn  . valACYclovir (VALTREX) 1000 MG tablet Take 1,000 mg by mouth 3 (three) times daily.    . Vitamin D, Ergocalciferol, (DRISDOL) 1.25 MG (50000 UNIT) CAPS capsule Take 50,000 Units by mouth once a week.     No current facility-administered medications for this visit.   Facility-Administered Medications Ordered in Other Visits  Medication Dose Route Frequency Provider Last Rate Last Admin  . sodium chloride flush (NS) 0.9 % injection 10 mL  10 mL Intravenous PRN Curt Bears, MD      . sodium chloride flush (NS) 0.9 % injection 10 mL  10 mL Intravenous PRN Curt Bears, MD        SURGICAL HISTORY:  Past Surgical History:  Procedure Laterality Date  . ABDOMINAL HYSTERECTOMY    . COLOSTOMY TAKEDOWN N/A 07/10/2014   Procedure: LAPAROSCOPIC LYSIS OF ADHESIONS (90 MIN) LAPAROSCOPIC ASSISTED COLOSTOMY CLOSURE, RIGID PROCTOSIGMOIDOSCOPY;  Surgeon: Jackolyn Confer, MD;  Location: [facility];  Service: General;  Laterality: N/A;  . LAPAROTOMY N/A 11/03/2013   Procedure: EXPLORATORY LAPAROTOMY, DRAINAGE OF INTRA  ABDOMINAL ABSCESSES, MOBILIZATION OF SPLENIC FLEXURE, SIGMOID COLECTOMY WITH COLOSTOMY;  Surgeon: Odis Hollingshead, MD;  Location: [facility];  Service: General;  Laterality: N/A;  . VIDEO BRONCHOSCOPY Bilateral 08/30/2013   Procedure: VIDEO BRONCHOSCOPY WITH FLUORO;  Surgeon: Tanda Rockers, MD;  Location: [facility];  Service: Cardiopulmonary;  Laterality: Bilateral;    REVIEW OF SYSTEMS:   Review of Systems  Constitutional: Negative for appetite change, chills, fatigue, fever and unexpected weight change.  HENT: Negative for mouth sores, nosebleeds, sore throat and trouble swallowing.   Eyes: Negative for eye problems and icterus.  Respiratory: Negative for cough, hemoptysis, shortness of breath and wheezing.   Cardiovascular: Negative for chest pain and leg swelling.  Gastrointestinal: Positive for constipation following treatment. Negative for abdominal  pain, diarrhea, nausea and vomiting.  Genitourinary: Negative for bladder incontinence, difficulty urinating, dysuria, frequency and hematuria.   Musculoskeletal: Positive for occasional mild "catching" sensation in her right lumbar/lateral abdomen. Negative for back pain, gait problem, neck pain and neck stiffness.  Skin: Negative for itching and rash.  Neurological: Negative for dizziness, extremity weakness, gait problem, headaches, light-headedness and seizures.  Hematological: Negative for adenopathy. Does not bruise/bleed easily.  Psychiatric/Behavioral: Negative for confusion, depression and sleep disturbance. The patient is not nervous/anxious.     PHYSICAL EXAMINATION:  Blood pressure (!) 125/56, pulse 99, temperature 98.3 F (36.8 C), temperature source Temporal, resp. rate 17, height _0  (1.676 m), weight 183 lb 14.4 oz (83.4 kg), SpO2 100 %.  ECOG PERFORMANCE STATUS: 1 - Symptomatic but completely ambulatory  Physical Exam  Constitutional: Oriented to person, place, and time and well-developed, well-nourished, and in no distress. No distress.  HENT:  Head: Normocephalic and atraumatic.  Mouth/Throat: Oropharynx is clear and moist. No oropharyngeal exudate.  Eyes: Conjunctivae are normal. Right eye exhibits no discharge. Left eye exhibits no discharge. No scleral icterus.  Neck: Normal range  of motion. Neck supple.  Cardiovascular: Normal rate, regular rhythm, normal heart sounds and intact distal pulses.   Pulmonary/Chest: Effort normal and breath sounds normal. No respiratory distress. No wheezes. No rales.  Abdominal: Soft. Bowel sounds are normal. Exhibits no distension and no mass. There is no tenderness.  Musculoskeletal: Normal range of motion. Exhibits no edema.  Lymphadenopathy:    No cervical adenopathy.  Neurological: Alert and oriented to person, place, and time. Exhibits normal muscle tone. Gait normal. Coordination normal.  Skin: Skin is warm and dry. No rash  noted. Not diaphoretic. No erythema. No pallor.  Psychiatric: Mood, memory and judgment normal.  Vitals reviewed.  LABORATORY DATA: Lab Results  Component Value Date   WBC 6.1 01/13/2020   HGB 12.3 01/13/2020   HCT 38.1 01/13/2020   MCV 99.2 01/13/2020   PLT 335 01/13/2020      Chemistry      Component Value Date/Time   NA 139 01/13/2020 0850   NA 139 08/14/2017 0837   K 4.0 01/13/2020 0850   K 4.4 08/14/2017 0837   CL 107 01/13/2020 0850   CO2 21 (L) 01/13/2020 0850   CO2 24 08/14/2017 0837   BUN 16 01/13/2020 0850   BUN 13.3 08/14/2017 0837   CREATININE 0.84 01/13/2020 0850   CREATININE 0.75 12/02/2019 0753   CREATININE 0.8 08/14/2017 0837      Component Value Date/Time   CALCIUM 9.3 01/13/2020 0850   CALCIUM 9.3 08/14/2017 0837   ALKPHOS 100 01/13/2020 0850   ALKPHOS 107 08/14/2017 0837   AST 15 01/13/2020 0850   AST 17 12/02/2019 0753   AST 12 08/14/2017 0837   ALT 16 01/13/2020 0850   ALT 16 12/02/2019 0753   ALT 12 08/14/2017 0837   BILITOT 0.5 01/13/2020 0850   BILITOT 0.4 12/02/2019 0753   BILITOT 0.37 08/14/2017 0837       RADIOGRAPHIC STUDIES:  MR Brain W Wo Contrast  Result Date: 01/10/2020 CLINICAL DATA:  Metastatic lung cancer post SRS, follow-up EXAM: MRI HEAD WITHOUT AND WITH CONTRAST TECHNIQUE: Multiplanar, multiecho pulse sequences of the brain and surrounding structures were obtained without and with intravenous contrast. CONTRAST:  20m MULTIHANCE GADOBENATE DIMEGLUMINE 529 MG/ML IV SOLN COMPARISON:  07/12/2019 FINDINGS: Brain: No substantial change in size or appearance of heterogeneously enhancing right parietal lesion again measuring up to 1.5 cm. Surrounding T2 FLAIR hyperintensity is also unchanged. There is no new mass or abnormal enhancement. Chronic blood products are identified associated with the above lesion. There is no new hemorrhage or acute infarction. Stable additional patchy and confluent areas of T2 hyperintensity in the  supratentorial white matter likely reflecting chronic microvascular ischemic and/or therapy related changes. There is no hydrocephalus. Vascular: Major vessel flow voids at the skull base are preserved. Skull and upper cervical spine: Normal marrow signal is preserved. Sinuses/Orbits: Mild patchy paranasal sinus mucosal thickening. Bilateral lens replacements. Other: Sella is unremarkable.  Minimal mastoid fluid opacification. IMPRESSION: Continued stability of treated right parietal lesion. No new mass or abnormal enhancement. Electronically Signed   By: PMacy MisM.D.   On: 01/10/2020 08:23     ASSESSMENT/PLAN:  This isa very pleasant 69year old Caucasian female with stage IV non-small cell lung cancer, adenocarcinoma who presented with a right upper lobe lungmass in addition to a solitary brain metastatsis. She was diagnosed in January 2015. The patient had completed induction systemic chemotherapy with carboplatin and Alimta with a partial response. The patient is currently being treated with single agentmaintenanceAlimta.  She is status post97cycles  Labs were reviewed. Recommend that she proceed with cycle #98 today as scheduled.   She will continue to take her decadron the day before, the day of, and the day after chemotherapy.  We will see her back for a follow up visit in 3 weeks for evaluation before starting cycle #99.   The patient inquired about her activity level. We discussed the multiple benefits of physical activity and the patient was encouraged to increase her activity.   The patient was advised to call immediately if she has any concerning symptoms in the interval. The patient voices understanding of current disease status and treatment options and is in agreement with the current care plan. All questions were answered. The patient knows to call the clinic with any problems, questions or concerns. We can certainly see the patient much sooner if necessary   No  orders of the defined types were placed in this encounter.    Texanna Hilburn L Marissah Vandemark, PA-C 01/13/20

## 2020-01-13 ENCOUNTER — Inpatient Hospital Stay: Payer: Medicare Other

## 2020-01-13 ENCOUNTER — Inpatient Hospital Stay: Payer: Medicare Other | Attending: Internal Medicine | Admitting: Physician Assistant

## 2020-01-13 ENCOUNTER — Other Ambulatory Visit: Payer: Self-pay

## 2020-01-13 ENCOUNTER — Encounter: Payer: Self-pay | Admitting: Radiation Oncology

## 2020-01-13 ENCOUNTER — Ambulatory Visit
Admission: RE | Admit: 2020-01-13 | Discharge: 2020-01-13 | Disposition: A | Discharge status: Home / Self Care | Payer: Medicare Other | Source: Ambulatory Visit | Attending: Radiation Oncology | Admitting: Radiation Oncology

## 2020-01-13 VITALS — BP 125/56 | HR 99 | Temp 98.3°F | Resp 17 | Ht 66.0 in | Wt 183.9 lb

## 2020-01-13 DIAGNOSIS — Z5111 Encounter for antineoplastic chemotherapy: Secondary | ICD-10-CM | POA: Diagnosis not present

## 2020-01-13 DIAGNOSIS — Z7952 Long term (current) use of systemic steroids: Secondary | ICD-10-CM | POA: Insufficient documentation

## 2020-01-13 DIAGNOSIS — Z7982 Long term (current) use of aspirin: Secondary | ICD-10-CM | POA: Diagnosis not present

## 2020-01-13 DIAGNOSIS — Z923 Personal history of irradiation: Secondary | ICD-10-CM | POA: Diagnosis not present

## 2020-01-13 DIAGNOSIS — F419 Anxiety disorder, unspecified: Secondary | ICD-10-CM | POA: Insufficient documentation

## 2020-01-13 DIAGNOSIS — Z8541 Personal history of malignant neoplasm of cervix uteri: Secondary | ICD-10-CM | POA: Diagnosis not present

## 2020-01-13 DIAGNOSIS — C7931 Secondary malignant neoplasm of brain: Secondary | ICD-10-CM | POA: Insufficient documentation

## 2020-01-13 DIAGNOSIS — K5900 Constipation, unspecified: Secondary | ICD-10-CM | POA: Insufficient documentation

## 2020-01-13 DIAGNOSIS — C3411 Malignant neoplasm of upper lobe, right bronchus or lung: Secondary | ICD-10-CM

## 2020-01-13 DIAGNOSIS — Z79899 Other long term (current) drug therapy: Secondary | ICD-10-CM | POA: Insufficient documentation

## 2020-01-13 DIAGNOSIS — C7949 Secondary malignant neoplasm of other parts of nervous system: Secondary | ICD-10-CM

## 2020-01-13 DIAGNOSIS — Z08 Encounter for follow-up examination after completed treatment for malignant neoplasm: Secondary | ICD-10-CM | POA: Diagnosis not present

## 2020-01-13 DIAGNOSIS — Z90710 Acquired absence of both cervix and uterus: Secondary | ICD-10-CM | POA: Insufficient documentation

## 2020-01-13 DIAGNOSIS — Z5112 Encounter for antineoplastic immunotherapy: Secondary | ICD-10-CM | POA: Diagnosis present

## 2020-01-13 DIAGNOSIS — Z85118 Personal history of other malignant neoplasm of bronchus and lung: Secondary | ICD-10-CM | POA: Diagnosis not present

## 2020-01-13 LAB — COMPREHENSIVE METABOLIC PANEL
ALT: 16 U/L (ref 0–44)
AST: 15 U/L (ref 15–41)
Albumin: 3.4 g/dL — ABNORMAL LOW (ref 3.5–5.0)
Alkaline Phosphatase: 100 U/L (ref 38–126)
Anion gap: 11 (ref 5–15)
BUN: 16 mg/dL (ref 8–23)
CO2: 21 mmol/L — ABNORMAL LOW (ref 22–32)
Calcium: 9.3 mg/dL (ref 8.9–10.3)
Chloride: 107 mmol/L (ref 98–111)
Creatinine, Ser: 0.84 mg/dL (ref 0.44–1.00)
GFR calc Af Amer: >60 mL/min (ref >60)
GFR calc non Af Amer: >60 mL/min (ref >60)
Glucose, Bld: 220 mg/dL — ABNORMAL HIGH (ref 70–99)
Potassium: 4 mmol/L (ref 3.5–5.1)
Sodium: 139 mmol/L (ref 135–145)
Total Bilirubin: 0.5 mg/dL (ref 0.3–1.2)
Total Protein: 6.9 g/dL (ref 6.5–8.1)

## 2020-01-13 LAB — CBC WITH DIFFERENTIAL/PLATELET
Abs Immature Granulocytes: 0.04 10*3/uL (ref 0.00–0.07)
Basophils Absolute: 0 10*3/uL (ref 0.0–0.1)
Basophils Relative: 0 %
Eosinophils Absolute: 0 10*3/uL (ref 0.0–0.5)
Eosinophils Relative: 0 %
HCT: 38.1 % (ref 36.0–46.0)
Hemoglobin: 12.3 g/dL (ref 12.0–15.0)
Immature Granulocytes: 1 %
Lymphocytes Relative: 7 %
Lymphs Abs: 0.4 10*3/uL — ABNORMAL LOW (ref 0.7–4.0)
MCH: 32 pg (ref 26.0–34.0)
MCHC: 32.3 g/dL (ref 30.0–36.0)
MCV: 99.2 fL (ref 80.0–100.0)
Monocytes Absolute: 0.3 10*3/uL (ref 0.1–1.0)
Monocytes Relative: 5 %
Neutro Abs: 5.3 10*3/uL (ref 1.7–7.7)
Neutrophils Relative %: 87 %
Platelets: 335 10*3/uL (ref 150–400)
RBC: 3.84 MIL/uL — ABNORMAL LOW (ref 3.87–5.11)
RDW: 15 % (ref 11.5–15.5)
WBC: 6.1 10*3/uL (ref 4.0–10.5)
nRBC: 0 % (ref 0.0–0.2)

## 2020-01-13 MED ORDER — CYANOCOBALAMIN 1000 MCG/ML IJ SOLN
1000.0000 ug | Freq: Once | INTRAMUSCULAR | Status: AC
  Administered 2020-01-13: 1000 ug via INTRAMUSCULAR

## 2020-01-13 MED ORDER — SODIUM CHLORIDE 0.9% FLUSH
10.0000 mL | INTRAVENOUS | Status: DC | PRN
  Filled 2020-01-13: qty 10

## 2020-01-13 MED ORDER — SODIUM CHLORIDE 0.9 % IV SOLN
Freq: Once | INTRAVENOUS | Status: AC
  Filled 2020-01-13: qty 250

## 2020-01-13 MED ORDER — SODIUM CHLORIDE 0.9 % IV SOLN
10.0000 mg | Freq: Once | INTRAVENOUS | Status: AC
  Administered 2020-01-13: 10 mg via INTRAVENOUS
  Filled 2020-01-13: qty 10

## 2020-01-13 MED ORDER — SODIUM CHLORIDE 0.9 % IV SOLN
500.0000 mg/m2 | Freq: Once | INTRAVENOUS | Status: AC
  Administered 2020-01-13: 900 mg via INTRAVENOUS
  Filled 2020-01-13: qty 20

## 2020-01-13 MED ORDER — CYANOCOBALAMIN 1000 MCG/ML IJ SOLN
INTRAMUSCULAR | Status: AC
  Filled 2020-01-13: qty 1

## 2020-01-13 NOTE — Patient Instructions (Signed)
[facility] Discharge Instructions for Patients Receiving Chemotherapy  Today you received the following chemotherapy agents: Alimta  To help prevent nausea and vomiting after your treatment, we encourage you to take your nausea medication as prescribed.   If you develop nausea and vomiting that is not controlled by your nausea medication, call the clinic.   BELOW ARE SYMPTOMS THAT SHOULD BE REPORTED IMMEDIATELY:  *FEVER GREATER THAN 100.5 F  *CHILLS WITH OR WITHOUT FEVER  NAUSEA AND VOMITING THAT IS NOT CONTROLLED WITH YOUR NAUSEA MEDICATION  *UNUSUAL SHORTNESS OF BREATH  *UNUSUAL BRUISING OR BLEEDING  TENDERNESS IN MOUTH AND THROAT WITH OR WITHOUT PRESENCE OF ULCERS  *URINARY PROBLEMS  *BOWEL PROBLEMS  UNUSUAL RASH Items with * indicate a potential emergency and should be followed up as soon as possible.  Feel free to call the clinic should you have any questions or concerns. The clinic phone number is [phone].  Please show the [facility] at check-in to the Emergency Department and triage nurse.

## 2020-01-13 NOTE — Progress Notes (Addendum)
Radiation Oncology         [phone]) [phone]  Outpatient Follow Up - Conducted via telephone due to current COVID-19 concerns for limiting patient exposure  I spoke with the patient to conduct this consult visit via telephone to spare the patient unnecessary potential exposure in the healthcare setting during the current COVID-19 pandemic. The patient was notified in advance and was offered a [facility] meeting to allow for face to face communication but unfortunately reported that they did not have the appropriate resources/technology to support such a visit and instead preferred to proceed with a telephone discussion.  ________________________________  Name: Alexis [name] MRN: [id number]  Date: 01/13/2020  DOB: 1950/11/23  CC: [name], MD  [name], MD  Diagnosis:  Stage IV (T2a, N0, M1b) non-small cell lung cancer of the right upper lobe consistent with adenocarcinoma with brain metastasis at presentation.  Interval Since Last Radiation: 6 years, 1 month  10/28/2013 through 12/05/2013: The patient was treated to the right lung tumor to a dose of 50 gray in 25 fractions using a 3-D conformal technique. Daily image guidance was used for the patient's treatment.  10/16/2013 SRS Treatment: PTV1: Rt Parietal 62mm target was treated using 3 Arcs to a prescription dose of 20 Gy. ExacTrac Snap verification was performed for each couch angle.  Narrative:  The patient is contacted for routine follow-up. In summary this is a 69 y.o. patient with a history of metastatic lung cancer to the brain who was treated in two sessions in 2015 for her brain disease, followed by local control to the right lung. Since her treatment, she has done well and continues to be NED in the brain, and continues to remain with stable disease on systemic alimta. She is receiving her 98th cycle of this today.  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 She is no  longer taking these medications. Her most recent MRI of the brain was on 01/09/20 and showed stability of her treated lesion in the parietal region without concerns for active malignancy and no new lesions.   On review of systems, the patient reports that she is doing well overall. She reports she feels great and has been gardening quite a lot lately. She denies any headaches. She denies any visual or auditory changes. She denies any chest pain, shortness of breath, cough, fevers, chills, night sweats, unintended weight changes. She denies any bowel or bladder disturbances, and denies abdominal pain, nausea or vomiting.  She denies any new musculoskeletal or joint aches or pains, new skin lesions or concerns. A complete review of systems is obtained and is otherwise negative.    Past Medical History:  Past Medical History:  Diagnosis Date  . Anxiety   . Anxiety 06/20/2016  . Cervical cancer ([facility])   . Encounter for antineoplastic chemotherapy 07/20/2015  . 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 ABDOMINAL HYSTERECTOMY    . COLOSTOMY TAKEDOWN N/A 07/10/2014   Procedure: LAPAROSCOPIC LYSIS OF ADHESIONS (90 MIN) LAPAROSCOPIC ASSISTED COLOSTOMY CLOSURE, RIGID PROCTOSIGMOIDOSCOPY;  Surgeon: Jackolyn Confer, MD;  Location: [facility];  Service: General;  Laterality: N/A;  . LAPAROTOMY N/A 11/03/2013   Procedure: EXPLORATORY LAPAROTOMY, DRAINAGE OF INTRA  ABDOMINAL ABSCESSES, MOBILIZATION OF SPLENIC FLEXURE, SIGMOID COLECTOMY WITH COLOSTOMY;  Surgeon: Sherren Mocha  Adalberto Cole, MD;  Location: [facility];  Service: General;  Laterality: N/A;  . VIDEO BRONCHOSCOPY Bilateral 08/30/2013   Procedure: VIDEO BRONCHOSCOPY WITH FLUORO;  Surgeon: Tanda Rockers, MD;  Location: [facility];  Service: Cardiopulmonary;  Laterality: Bilateral;    Social History:  Social History    Socioeconomic History  . Marital status: Married    Spouse name: Not on file  . Number of children: Not on file  . Years of education: Not on file  . Highest education level: Not on file  Occupational History  . 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 Smoking status: Former Smoker    Packs/day: 1.00    Years: 40.00    Pack years: 40.00    Types: Cigarettes    Quit date: 09/27/2013    Years since quitting: 6.2  . Smokeless tobacco: Never Used  Substance and Sexual Activity  . Alcohol use: No  . Drug use: No  . Sexual activity: Yes  Other Topics Concern  . Not on file  Social History Narrative  . Not on file   Social Determinants of Health   Financial Resource Strain:   . Difficulty of Paying Living Expenses:   Food Insecurity:   . Worried About Charity fundraiser in the Last Year:   . Arboriculturist in the Last Year:   Transportation Needs:   . Film/video editor (Medical):   Marland Kitchen Lack of Transportation (Non-Medical):   Physical Activity:   . Days of Exercise per Week:   . Minutes of Exercise per Session:   Stress:   . Feeling of Stress :   Social Connections:   . Frequency of Communication with Friends and Family:   . Frequency of Social Gatherings with Friends and Family:   . Attends Religious Services:   . Active Member of Clubs or Organizations:   . Attends Archivist Meetings:   Marland Kitchen Marital Status:   Intimate Partner Violence:   . Fear of Current or Ex-Partner:   . Emotionally Abused:   Marland Kitchen Physically Abused:   . Sexually Abused:   The patient is married. She lives in [place], [place].  Family History: Family History  Problem Relation Age of Onset  . Emphysema Father        smoked  . Lung cancer Father        smoked  . Cancer Mother   . Hypertension Mother   . COPD Mother     ALLERGIES:  is allergic to codeine.  Meds: Current Outpatient Medications  Medication Sig Dispense Refill  . acetaminophen  (TYLENOL) 500 MG tablet Take 500 mg by mouth every 6 (six) hours as needed for mild pain or headache. Reported on 11/02/2015    . ALPRAZolam (XANAX) 1 MG tablet SMARTSIG:1 Tablet(s) By Mouth 1 to 3 Times Daily PRN    . Ascorbic Acid (VITAMIN C GUMMIE PO) Take 1 each by mouth every morning.    Marland Kitchen aspirin EC 81 MG tablet Take 1 tablet (81 mg total) by mouth daily. 150 tablet 2  . [employer] ACID CONTROLLER 10 MG tablet SMARTSIG:1 Tablet(s) By Mouth Every 12 Hours PRN    . dexamethasone (DECADRON) 4 MG tablet Take 1 pill twice a day the day before, the day of, and the day after chemotherapy. 40 tablet 2  . folic acid (FOLVITE) 1 MG tablet TAKE 1 TABLET BY MOUTH EVERY DAY 90 tablet 0  . HYDROcodone-acetaminophen (NORCO/VICODIN) 5-325  MG tablet TAKE 1 TABLET BY MOUTH 3 TIMES A DAY FOR 5 DAYS AS NEEDED FOR PAIN    . Multiple Vitamin (MULTIVITAMIN WITH MINERALS) TABS tablet Take 1 tablet by mouth every morning.    . ondansetron (ZOFRAN) 8 MG tablet TAKE 1 TABLET BY MOUTH BEFORE CHEMO 30 tablet 0  . OVER THE COUNTER MEDICATION Take 1 tablet by mouth every morning. (Vitamin A)    . prochlorperazine (COMPAZINE) 10 MG tablet Take 1 tablet (10 mg total) by mouth every 6 (six) hours as needed for nausea or vomiting. 60 tablet 0  . rosuvastatin (CRESTOR) 10 MG tablet Take 1 tablet (10 mg total) by mouth daily. 30 tablet 12  . senna-docusate (SENOKOT-S) 8.6-50 MG tablet Take 1 tablet by mouth daily. 30 tablet prn  . valACYclovir (VALTREX) 1000 MG tablet Take 1,000 mg by mouth 3 (three) times daily.    . Vitamin D, Ergocalciferol, (DRISDOL) 1.25 MG (50000 UNIT) CAPS capsule Take 50,000 Units by mouth once a week.     No current facility-administered medications for this encounter.   Facility-Administered Medications Ordered in Other Encounters  Medication Dose Route Frequency Provider Last Rate Last Admin  . sodium chloride flush (NS) 0.9 % injection 10 mL  10 mL Intravenous PRN Curt Bears, MD      . sodium  chloride flush (NS) 0.9 % injection 10 mL  10 mL Intravenous PRN Curt Bears, MD        Physical Findings: Unable to assess due to type of encounter  Lab Findings: Lab Results  Component Value Date   WBC 6.1 01/13/2020   HGB 12.3 01/13/2020   HCT 38.1 01/13/2020   MCV 99.2 01/13/2020   PLT 335 01/13/2020     Radiographic Findings: MR Brain W Wo Contrast  Result Date: 01/10/2020 CLINICAL DATA:  Metastatic lung cancer post SRS, follow-up EXAM: MRI HEAD WITHOUT AND WITH CONTRAST TECHNIQUE: Multiplanar, multiecho pulse sequences of the brain and surrounding structures were obtained without and with intravenous contrast. CONTRAST:  54mL MULTIHANCE GADOBENATE DIMEGLUMINE 529 MG/ML IV SOLN COMPARISON:  07/12/2019 FINDINGS: Brain: No substantial change in size or appearance of heterogeneously enhancing right parietal lesion again measuring up to 1.5 cm. Surrounding T2 FLAIR hyperintensity is also unchanged. There is no new mass or abnormal enhancement. Chronic blood products are identified associated with the above lesion. There is no new hemorrhage or acute infarction. Stable additional patchy and confluent areas of T2 hyperintensity in the supratentorial white matter likely reflecting chronic microvascular ischemic and/or therapy related changes. There is no hydrocephalus. Vascular: Major vessel flow voids at the skull base are preserved. Skull and upper cervical spine: Normal marrow signal is preserved. Sinuses/Orbits: Mild patchy paranasal sinus mucosal thickening. Bilateral lens replacements. Other: Sella is unremarkable.  Minimal mastoid fluid opacification. IMPRESSION: Continued stability of treated right parietal lesion. No new mass or abnormal enhancement. Electronically Signed   By: Macy Mis M.D.   On: 01/10/2020 08:23    Impression/Plan: 1. 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 The  patient remains without disease in the brain. She continues to do exceptionally well also with systemic Alimta with Dr. Julien Nordmann. Current NCCN guidelines recommend repeating imaging q 6 months indefinitely. She is in agreement with this plan and we will see her back later this year to review her scans at that time.  Given current concerns for patient exposure during the COVID-19 pandemic,  this encounter was conducted via telephone.  The patient has given verbal consent for this type of encounter. The time spent during this encounter was 35 minutes and 50% of that time was spent in the coordination of his care. The attendants for this meeting include Shona Simpson, [facility] and [name] and her husband Isadore Bokhari.  During the encounter, Shona Simpson [facility] was located at [facility] Radiation Oncology Department.  [name]  was located at the [facility] as well in infusion, while her husband was also at home.     Carola Rhine, PAC

## 2020-02-03 ENCOUNTER — Inpatient Hospital Stay: Payer: Medicare Other

## 2020-02-03 ENCOUNTER — Encounter: Payer: Self-pay | Admitting: Internal Medicine

## 2020-02-03 ENCOUNTER — Inpatient Hospital Stay: Payer: Medicare Other | Attending: Internal Medicine | Admitting: Internal Medicine

## 2020-02-03 ENCOUNTER — Other Ambulatory Visit: Payer: Self-pay

## 2020-02-03 VITALS — BP 127/70 | HR 100 | Temp 98.1°F | Resp 20 | Ht 66.0 in | Wt 184.4 lb

## 2020-02-03 DIAGNOSIS — Z5112 Encounter for antineoplastic immunotherapy: Secondary | ICD-10-CM | POA: Diagnosis not present

## 2020-02-03 DIAGNOSIS — Z8541 Personal history of malignant neoplasm of cervix uteri: Secondary | ICD-10-CM | POA: Diagnosis not present

## 2020-02-03 DIAGNOSIS — Z923 Personal history of irradiation: Secondary | ICD-10-CM | POA: Insufficient documentation

## 2020-02-03 DIAGNOSIS — Z90710 Acquired absence of both cervix and uterus: Secondary | ICD-10-CM | POA: Diagnosis not present

## 2020-02-03 DIAGNOSIS — C3411 Malignant neoplasm of upper lobe, right bronchus or lung: Secondary | ICD-10-CM

## 2020-02-03 DIAGNOSIS — Z79899 Other long term (current) drug therapy: Secondary | ICD-10-CM | POA: Insufficient documentation

## 2020-02-03 DIAGNOSIS — C7931 Secondary malignant neoplasm of brain: Secondary | ICD-10-CM | POA: Insufficient documentation

## 2020-02-03 DIAGNOSIS — Z5111 Encounter for antineoplastic chemotherapy: Secondary | ICD-10-CM

## 2020-02-03 LAB — COMPREHENSIVE METABOLIC PANEL
ALT: 16 U/L (ref 0–44)
AST: 10 U/L — ABNORMAL LOW (ref 15–41)
Albumin: 3.4 g/dL — ABNORMAL LOW (ref 3.5–5.0)
Alkaline Phosphatase: 93 U/L (ref 38–126)
Anion gap: 13 (ref 5–15)
BUN: 15 mg/dL (ref 8–23)
CO2: 19 mmol/L — ABNORMAL LOW (ref 22–32)
Calcium: 9.8 mg/dL (ref 8.9–10.3)
Chloride: 106 mmol/L (ref 98–111)
Creatinine, Ser: 0.86 mg/dL (ref 0.44–1.00)
GFR calc Af Amer: >60 mL/min (ref >60)
GFR calc non Af Amer: >60 mL/min (ref >60)
Glucose, Bld: 206 mg/dL — ABNORMAL HIGH (ref 70–99)
Potassium: 4.2 mmol/L (ref 3.5–5.1)
Sodium: 138 mmol/L (ref 135–145)
Total Bilirubin: 0.5 mg/dL (ref 0.3–1.2)
Total Protein: 6.9 g/dL (ref 6.5–8.1)

## 2020-02-03 LAB — CBC WITH DIFFERENTIAL/PLATELET
Abs Immature Granulocytes: 0.12 10*3/uL — ABNORMAL HIGH (ref 0.00–0.07)
Basophils Absolute: 0 10*3/uL (ref 0.0–0.1)
Basophils Relative: 0 %
Eosinophils Absolute: 0 10*3/uL (ref 0.0–0.5)
Eosinophils Relative: 0 %
HCT: 38.6 % (ref 36.0–46.0)
Hemoglobin: 12.4 g/dL (ref 12.0–15.0)
Immature Granulocytes: 1 %
Lymphocytes Relative: 6 %
Lymphs Abs: 0.7 10*3/uL (ref 0.7–4.0)
MCH: 32 pg (ref 26.0–34.0)
MCHC: 32.1 g/dL (ref 30.0–36.0)
MCV: 99.7 fL (ref 80.0–100.0)
Monocytes Absolute: 0.9 10*3/uL (ref 0.1–1.0)
Monocytes Relative: 8 %
Neutro Abs: 10.1 10*3/uL — ABNORMAL HIGH (ref 1.7–7.7)
Neutrophils Relative %: 85 %
Platelets: 274 10*3/uL (ref 150–400)
RBC: 3.87 MIL/uL (ref 3.87–5.11)
RDW: 14.9 % (ref 11.5–15.5)
WBC: 11.9 10*3/uL — ABNORMAL HIGH (ref 4.0–10.5)
nRBC: 0 % (ref 0.0–0.2)

## 2020-02-03 MED ORDER — SODIUM CHLORIDE 0.9 % IV SOLN
10.0000 mg | Freq: Once | INTRAVENOUS | Status: AC
  Administered 2020-02-03: 10 mg via INTRAVENOUS
  Filled 2020-02-03: qty 10

## 2020-02-03 MED ORDER — SODIUM CHLORIDE 0.9 % IV SOLN
Freq: Once | INTRAVENOUS | Status: AC
  Filled 2020-02-03: qty 250

## 2020-02-03 MED ORDER — SODIUM CHLORIDE 0.9 % IV SOLN
500.0000 mg/m2 | Freq: Once | INTRAVENOUS | Status: AC
  Administered 2020-02-03: 900 mg via INTRAVENOUS
  Filled 2020-02-03: qty 16

## 2020-02-03 NOTE — Progress Notes (Signed)
[facility] [phone]   [phone]  OFFICE PROGRESS NOTE  Alexis Roers, MD [address] [place] [address]  DIAGNOSIS: Stage IV (T2a, N0, M1b) non-small cell lung cancer, adenocarcinoma with negative EGFR and ALK mutations diagnosed in January 2015 and presented with right upper lobe lung mass in addition to a solitary brain metastasis.  PRIOR THERAPY: 1) Status post stereotactic radiotherapy to a solitary right parietal brain lesion under the care of Dr. [name] on 10/16/2013. 2) Status post palliative radiotherapy to the right lung tumor under the care of Dr. [name] completed on 12/05/2013. 3) Systemic chemotherapy with carboplatin for AUC of 5 and Alimta 500 mg/M2 every 3 weeks. First dose Jan 06 2014. Status post 6 cycles.  CURRENT THERAPY: Systemic chemotherapy with maintenance Alimta 500 MG/M2 every 3 weeks, status post 98 cycles.  INTERVAL HISTORY: Alexis [name] 69 y.o. female returns to the clinic today for follow-up visit.  The patient is feeling fine today with no concerning complaints except for mild pain on the right side of the abdomen and flank area.  She denied having any current chest pain, shortness of breath, cough or hemoptysis.  She denied having any fever or chills.  She has no nausea, vomiting, diarrhea or constipation.  She has no headache or visual changes.  The patient is here today for evaluation before starting cycle #99 of her treatment.   MEDICAL HISTORY: Past Medical History:  Diagnosis Date  . Anxiety   . Anxiety 06/20/2016  . Cervical cancer ([place])   . Encounter for antineoplastic chemotherapy 07/20/2015  . 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  MEDICATIONS:  Current Outpatient Medications  Medication Sig Dispense Refill  . acetaminophen (TYLENOL) 500 MG tablet Take 500 mg by mouth every 6  (six) hours as needed for mild pain or headache. Reported on 11/02/2015    . ALPRAZolam (XANAX) 1 MG tablet SMARTSIG:1 Tablet(s) By Mouth 1 to 3 Times Daily PRN    . Ascorbic Acid (VITAMIN C GUMMIE PO) Take 1 each by mouth every morning.    Marland Kitchen aspirin EC 81 MG tablet Take 1 tablet (81 mg total) by mouth daily. 150 tablet 2  . [employer] ACID CONTROLLER 10 MG tablet SMARTSIG:1 Tablet(s) By Mouth Every 12 Hours PRN    . dexamethasone (DECADRON) 4 MG tablet Take 1 pill twice a day the day before, the day of, and the day after chemotherapy. 40 tablet 2  . folic acid (FOLVITE) 1 MG tablet TAKE 1 TABLET BY MOUTH EVERY DAY 90 tablet 0  . HYDROcodone-acetaminophen (NORCO/VICODIN) 5-325 MG tablet TAKE 1 TABLET BY MOUTH 3 TIMES A DAY FOR 5 DAYS AS NEEDED FOR PAIN    . Multiple Vitamin (MULTIVITAMIN WITH MINERALS) TABS tablet Take 1 tablet by mouth every morning.    . ondansetron (ZOFRAN) 8 MG tablet TAKE 1 TABLET BY MOUTH BEFORE CHEMO 30 tablet 0  . OVER THE COUNTER MEDICATION Take 1 tablet by mouth every morning. (Vitamin A)    . prochlorperazine (COMPAZINE) 10 MG tablet Take 1 tablet (10 mg total) by mouth every 6 (six) hours as needed for nausea or vomiting. 60 tablet 0  . rosuvastatin (CRESTOR) 10 MG tablet Take 1 tablet (10 mg total) by mouth daily. 30 tablet 12  . senna-docusate (SENOKOT-S) 8.6-50 MG tablet  Take 1 tablet by mouth daily. 30 tablet prn  . valACYclovir (VALTREX) 1000 MG tablet Take 1,000 mg by mouth 3 (three) times daily.    . Vitamin D, Ergocalciferol, (DRISDOL) 1.25 MG (50000 UNIT) CAPS capsule Take 50,000 Units by mouth once a week.     No current facility-administered medications for this visit.   Facility-Administered Medications Ordered in Other Visits  Medication Dose Route Frequency Provider Last Rate Last Admin  . sodium chloride flush (NS) 0.9 % injection 10 mL  10 mL Intravenous PRN Curt Bears, MD      . sodium chloride flush (NS) 0.9 % injection 10 mL  10 mL Intravenous PRN  Curt Bears, MD        SURGICAL HISTORY:  Past Surgical History:  Procedure Laterality Date  . ABDOMINAL HYSTERECTOMY    . COLOSTOMY TAKEDOWN N/A 07/10/2014   Procedure: LAPAROSCOPIC LYSIS OF ADHESIONS (90 MIN) LAPAROSCOPIC ASSISTED COLOSTOMY CLOSURE, RIGID PROCTOSIGMOIDOSCOPY;  Surgeon: Jackolyn Confer, MD;  Location: [facility];  Service: General;  Laterality: N/A;  . LAPAROTOMY N/A 11/03/2013   Procedure: EXPLORATORY LAPAROTOMY, DRAINAGE OF INTRA  ABDOMINAL ABSCESSES, MOBILIZATION OF SPLENIC FLEXURE, SIGMOID COLECTOMY WITH COLOSTOMY;  Surgeon: Odis Hollingshead, MD;  Location: [facility];  Service: General;  Laterality: N/A;  . VIDEO BRONCHOSCOPY Bilateral 08/30/2013   Procedure: VIDEO BRONCHOSCOPY WITH FLUORO;  Surgeon: Tanda Rockers, MD;  Location: [facility];  Service: Cardiopulmonary;  Laterality: Bilateral;    REVIEW OF SYSTEMS:  A comprehensive review of systems was negative.   PHYSICAL EXAMINATION: General appearance: alert, cooperative and no distress Head: Normocephalic, without obvious abnormality, atraumatic Neck: no adenopathy, no JVD, supple, symmetrical, trachea midline and thyroid not enlarged, symmetric, no tenderness/mass/nodules Lymph nodes: Cervical, supraclavicular, and axillary nodes normal. Resp: clear to auscultation bilaterally Back: symmetric, no curvature. ROM normal. No CVA tenderness. Cardio: regular rate and rhythm, S1, S2 normal, no murmur, click, rub or gallop GI: soft, non-tender; bowel sounds normal; no masses,  no organomegaly Extremities: extremities normal, atraumatic, no cyanosis or edema   ECOG PERFORMANCE STATUS: 1 - Symptomatic but completely ambulatory  Blood pressure 127/70, pulse 100, temperature 98.1 F (36.7 C), temperature source Temporal, resp. rate 20, height _0  (1.676 m), weight 184 lb 6.4 oz (83.6 kg), SpO2 98 %.  LABORATORY DATA: Lab Results  Component Value Date   WBC 11.9 (H) 02/03/2020   HGB 12.4 02/03/2020   HCT 38.6  02/03/2020   MCV 99.7 02/03/2020   PLT 274 02/03/2020      Chemistry      Component Value Date/Time   NA 139 01/13/2020 0850   NA 139 08/14/2017 0837   K 4.0 01/13/2020 0850   K 4.4 08/14/2017 0837   CL 107 01/13/2020 0850   CO2 21 (L) 01/13/2020 0850   CO2 24 08/14/2017 0837   BUN 16 01/13/2020 0850   BUN 13.3 08/14/2017 0837   CREATININE 0.84 01/13/2020 0850   CREATININE 0.75 12/02/2019 0753   CREATININE 0.8 08/14/2017 0837      Component Value Date/Time   CALCIUM 9.3 01/13/2020 0850   CALCIUM 9.3 08/14/2017 0837   ALKPHOS 100 01/13/2020 0850   ALKPHOS 107 08/14/2017 0837   AST 15 01/13/2020 0850   AST 17 12/02/2019 0753   AST 12 08/14/2017 0837   ALT 16 01/13/2020 0850   ALT 16 12/02/2019 0753   ALT 12 08/14/2017 0837   BILITOT 0.5 01/13/2020 0850   BILITOT 0.4 12/02/2019 0753   BILITOT 0.37 08/14/2017  0837       RADIOGRAPHIC STUDIES: MR Brain W Wo Contrast  Result Date: 01/10/2020 CLINICAL DATA:  Metastatic lung cancer post SRS, follow-up EXAM: MRI HEAD WITHOUT AND WITH CONTRAST TECHNIQUE: Multiplanar, multiecho pulse sequences of the brain and surrounding structures were obtained without and with intravenous contrast. CONTRAST:  90m MULTIHANCE GADOBENATE DIMEGLUMINE 529 MG/ML IV SOLN COMPARISON:  07/12/2019 FINDINGS: Brain: No substantial change in size or appearance of heterogeneously enhancing right parietal lesion again measuring up to 1.5 cm. Surrounding T2 FLAIR hyperintensity is also unchanged. There is no new mass or abnormal enhancement. Chronic blood products are identified associated with the above lesion. There is no new hemorrhage or acute infarction. Stable additional patchy and confluent areas of T2 hyperintensity in the supratentorial white matter likely reflecting chronic microvascular ischemic and/or therapy related changes. There is no hydrocephalus. Vascular: Major vessel flow voids at the skull base are preserved. Skull and upper cervical spine:  Normal marrow signal is preserved. Sinuses/Orbits: Mild patchy paranasal sinus mucosal thickening. Bilateral lens replacements. Other: Sella is unremarkable.  Minimal mastoid fluid opacification. IMPRESSION: Continued stability of treated right parietal lesion. No new mass or abnormal enhancement. Electronically Signed   By: PMacy MisM.D.   On: 01/10/2020 08:23    ASSESSMENT AND PLAN:  This is a very pleasant 69years old white female with metastatic non-small cell lung cancer, adenocarcinoma status post induction systemic chemotherapy with carboplatin and Alimta with partial response. The patient is currently on maintenance treatment with single agent Alimta status post 98 cycles. The patient continues to tolerate her treatment fairly well with no concerning complaints. I recommended for her to proceed with cycle #99 today as planned. She will come back for follow-up visit in 3 weeks for evaluation before the next cycle of her treatment. She was advised to call immediately if she has any concerning symptoms in the interval. The patient voices understanding of current disease status and treatment options and is in agreement with the current care plan. All questions were answered. The patient knows to call the clinic with any problems, questions or concerns. We can certainly see the patient much sooner if necessary.  Disclaimer: This note was dictated with voice recognition software. Similar sounding words can inadvertently be transcribed and may not be corrected upon review.

## 2020-02-03 NOTE — Patient Instructions (Signed)
[facility] Discharge Instructions for Patients Receiving Chemotherapy  Today you received the following chemotherapy agents: Alimta  To help prevent nausea and vomiting after your treatment, we encourage you to take your nausea medication as prescribed.   If you develop nausea and vomiting that is not controlled by your nausea medication, call the clinic.   BELOW ARE SYMPTOMS THAT SHOULD BE REPORTED IMMEDIATELY:  *FEVER GREATER THAN 100.5 F  *CHILLS WITH OR WITHOUT FEVER  NAUSEA AND VOMITING THAT IS NOT CONTROLLED WITH YOUR NAUSEA MEDICATION  *UNUSUAL SHORTNESS OF BREATH  *UNUSUAL BRUISING OR BLEEDING  TENDERNESS IN MOUTH AND THROAT WITH OR WITHOUT PRESENCE OF ULCERS  *URINARY PROBLEMS  *BOWEL PROBLEMS  UNUSUAL RASH Items with * indicate a potential emergency and should be followed up as soon as possible.  Feel free to call the clinic should you have any questions or concerns. The clinic phone number is [phone].  Please show the [facility] at check-in to the Emergency Department and triage nurse.

## 2020-02-05 ENCOUNTER — Telehealth: Payer: Self-pay | Admitting: Internal Medicine

## 2020-02-05 NOTE — Telephone Encounter (Signed)
Scheduled appt per 6/7 sch message - pt to get an updated schedule in treatment area.

## 2020-02-24 ENCOUNTER — Other Ambulatory Visit: Payer: Self-pay

## 2020-02-24 ENCOUNTER — Inpatient Hospital Stay (HOSPITAL_BASED_OUTPATIENT_CLINIC_OR_DEPARTMENT_OTHER): Payer: Medicare Other | Admitting: Internal Medicine

## 2020-02-24 ENCOUNTER — Inpatient Hospital Stay: Payer: Medicare Other

## 2020-02-24 ENCOUNTER — Encounter: Payer: Self-pay | Admitting: *Deleted

## 2020-02-24 ENCOUNTER — Encounter: Payer: Self-pay | Admitting: Internal Medicine

## 2020-02-24 VITALS — BP 139/57 | HR 104 | Temp 97.9°F | Resp 18 | Ht 66.0 in | Wt 189.3 lb

## 2020-02-24 VITALS — HR 99

## 2020-02-24 DIAGNOSIS — C7949 Secondary malignant neoplasm of other parts of nervous system: Secondary | ICD-10-CM | POA: Diagnosis not present

## 2020-02-24 DIAGNOSIS — Z5111 Encounter for antineoplastic chemotherapy: Secondary | ICD-10-CM | POA: Diagnosis not present

## 2020-02-24 DIAGNOSIS — C3490 Malignant neoplasm of unspecified part of unspecified bronchus or lung: Secondary | ICD-10-CM | POA: Diagnosis not present

## 2020-02-24 DIAGNOSIS — Z90710 Acquired absence of both cervix and uterus: Secondary | ICD-10-CM | POA: Diagnosis not present

## 2020-02-24 DIAGNOSIS — Z8541 Personal history of malignant neoplasm of cervix uteri: Secondary | ICD-10-CM | POA: Diagnosis not present

## 2020-02-24 DIAGNOSIS — C3411 Malignant neoplasm of upper lobe, right bronchus or lung: Secondary | ICD-10-CM | POA: Diagnosis not present

## 2020-02-24 DIAGNOSIS — C7931 Secondary malignant neoplasm of brain: Secondary | ICD-10-CM | POA: Diagnosis not present

## 2020-02-24 DIAGNOSIS — Z923 Personal history of irradiation: Secondary | ICD-10-CM | POA: Diagnosis not present

## 2020-02-24 DIAGNOSIS — Z5112 Encounter for antineoplastic immunotherapy: Secondary | ICD-10-CM | POA: Diagnosis not present

## 2020-02-24 LAB — CBC WITH DIFFERENTIAL/PLATELET
Abs Immature Granulocytes: 0.1 10*3/uL — ABNORMAL HIGH (ref 0.00–0.07)
Basophils Absolute: 0 10*3/uL (ref 0.0–0.1)
Basophils Relative: 0 %
Eosinophils Absolute: 0 10*3/uL (ref 0.0–0.5)
Eosinophils Relative: 0 %
HCT: 37.9 % (ref 36.0–46.0)
Hemoglobin: 12.1 g/dL (ref 12.0–15.0)
Immature Granulocytes: 1 %
Lymphocytes Relative: 6 %
Lymphs Abs: 0.6 10*3/uL — ABNORMAL LOW (ref 0.7–4.0)
MCH: 31.5 pg (ref 26.0–34.0)
MCHC: 31.9 g/dL (ref 30.0–36.0)
MCV: 98.7 fL (ref 80.0–100.0)
Monocytes Absolute: 0.8 10*3/uL (ref 0.1–1.0)
Monocytes Relative: 8 %
Neutro Abs: 8.5 10*3/uL — ABNORMAL HIGH (ref 1.7–7.7)
Neutrophils Relative %: 85 %
Platelets: 318 10*3/uL (ref 150–400)
RBC: 3.84 MIL/uL — ABNORMAL LOW (ref 3.87–5.11)
RDW: 15.4 % (ref 11.5–15.5)
WBC: 10 10*3/uL (ref 4.0–10.5)
nRBC: 0 % (ref 0.0–0.2)

## 2020-02-24 LAB — COMPREHENSIVE METABOLIC PANEL
ALT: 20 U/L (ref 0–44)
AST: 12 U/L — ABNORMAL LOW (ref 15–41)
Albumin: 3.4 g/dL — ABNORMAL LOW (ref 3.5–5.0)
Alkaline Phosphatase: 100 U/L (ref 38–126)
Anion gap: 13 (ref 5–15)
BUN: 19 mg/dL (ref 8–23)
CO2: 19 mmol/L — ABNORMAL LOW (ref 22–32)
Calcium: 9.2 mg/dL (ref 8.9–10.3)
Chloride: 106 mmol/L (ref 98–111)
Creatinine, Ser: 0.91 mg/dL (ref 0.44–1.00)
GFR calc Af Amer: >60 mL/min (ref >60)
GFR calc non Af Amer: >60 mL/min (ref >60)
Glucose, Bld: 281 mg/dL — ABNORMAL HIGH (ref 70–99)
Potassium: 4.3 mmol/L (ref 3.5–5.1)
Sodium: 138 mmol/L (ref 135–145)
Total Bilirubin: 0.4 mg/dL (ref 0.3–1.2)
Total Protein: 6.8 g/dL (ref 6.5–8.1)

## 2020-02-24 MED ORDER — SODIUM CHLORIDE 0.9 % IV SOLN
Freq: Once | INTRAVENOUS | Status: AC
  Filled 2020-02-24: qty 250

## 2020-02-24 MED ORDER — SODIUM CHLORIDE 0.9 % IV SOLN
500.0000 mg/m2 | Freq: Once | INTRAVENOUS | Status: AC
  Administered 2020-02-24: 900 mg via INTRAVENOUS
  Filled 2020-02-24: qty 20

## 2020-02-24 MED ORDER — SODIUM CHLORIDE 0.9 % IV SOLN
10.0000 mg | Freq: Once | INTRAVENOUS | Status: AC
  Administered 2020-02-24: 10 mg via INTRAVENOUS
  Filled 2020-02-24: qty 10

## 2020-02-24 NOTE — Progress Notes (Signed)
[facility] Telephone:([phone]   Fax:(336) [phone]  OFFICE PROGRESS NOTE  [name], MD [address] [place] [address]  DIAGNOSIS: Stage IV (T2a, N0, M1b) non-small cell lung cancer, adenocarcinoma with negative EGFR and ALK mutations diagnosed in January 2015 and presented with right upper lobe lung mass in addition to a solitary brain metastasis.  PRIOR THERAPY: 1) Status post stereotactic radiotherapy to a solitary right parietal brain lesion under the care of Dr. [name] on 10/16/2013. 2) Status post palliative radiotherapy to the right lung tumor under the care of Dr. [name] completed on 12/05/2013. 3) Systemic chemotherapy with carboplatin for AUC of 5 and Alimta 500 mg/M2 every 3 weeks. First dose Jan 06 2014. Status post 6 cycles.  CURRENT THERAPY: Systemic chemotherapy with maintenance Alimta 500 MG/M2 every 3 weeks, status post 99 cycles.  INTERVAL HISTORY: Alexis [name] 69 y.o. female returns to the clinic today for follow-up visit.  The patient is feeling fine today with no concerning complaints.  She denied having any chest pain, shortness of breath, cough or hemoptysis.  She denied having any fever or chills.  She has no nausea, vomiting, diarrhea or constipation.  She has no headache or visual changes.  She has no recent weight loss or night sweats.  She continues to tolerate her treatment with maintenance Alimta fairly well.  She is here today for evaluation before starting cycle #100.  MEDICAL HISTORY: Past Medical History:  Diagnosis Date  . Anxiety   . Anxiety 06/20/2016  . Cervical cancer ([facility])   . Encounter for antineoplastic chemotherapy 07/20/2015  . 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  MEDICATIONS:  Current Outpatient Medications  Medication Sig Dispense Refill  . acetaminophen (TYLENOL) 500 MG tablet Take 500  mg by mouth every 6 (six) hours as needed for mild pain or headache. Reported on 11/02/2015    . ALPRAZolam (XANAX) 1 MG tablet SMARTSIG:1 Tablet(s) By Mouth 1 to 3 Times Daily PRN    . Ascorbic Acid (VITAMIN C GUMMIE PO) Take 1 each by mouth every morning.    Marland Kitchen aspirin EC 81 MG tablet Take 1 tablet (81 mg total) by mouth daily. 150 tablet 2  . [employer] ACID CONTROLLER 10 MG tablet SMARTSIG:1 Tablet(s) By Mouth Every 12 Hours PRN    . dexamethasone (DECADRON) 4 MG tablet Take 1 pill twice a day the day before, the day of, and the day after chemotherapy. 40 tablet 2  . folic acid (FOLVITE) 1 MG tablet TAKE 1 TABLET BY MOUTH EVERY DAY 90 tablet 0  . HYDROcodone-acetaminophen (NORCO/VICODIN) 5-325 MG tablet TAKE 1 TABLET BY MOUTH 3 TIMES A DAY FOR 5 DAYS AS NEEDED FOR PAIN    . Multiple Vitamin (MULTIVITAMIN WITH MINERALS) TABS tablet Take 1 tablet by mouth every morning.    . ondansetron (ZOFRAN) 8 MG tablet TAKE 1 TABLET BY MOUTH BEFORE CHEMO 30 tablet 0  . OVER THE COUNTER MEDICATION Take 1 tablet by mouth every morning. (Vitamin A)    . prochlorperazine (COMPAZINE) 10 MG tablet Take 1 tablet (10 mg total) by mouth every 6 (six) hours as needed for nausea or vomiting. 60 tablet 0  . rosuvastatin (CRESTOR) 10 MG tablet Take 1 tablet (10 mg total) by mouth daily. 30 tablet 12  . senna-docusate (SENOKOT-S) 8.6-50  MG tablet Take 1 tablet by mouth daily. 30 tablet prn  . valACYclovir (VALTREX) 1000 MG tablet Take 1,000 mg by mouth 3 (three) times daily.    . Vitamin D, Ergocalciferol, (DRISDOL) 1.25 MG (50000 UNIT) CAPS capsule Take 50,000 Units by mouth once a week.     No current facility-administered medications for this visit.   Facility-Administered Medications Ordered in Other Visits  Medication Dose Route Frequency Provider Last Rate Last Admin  . sodium chloride flush (NS) 0.9 % injection 10 mL  10 mL Intravenous PRN Curt Bears, MD      . sodium chloride flush (NS) 0.9 % injection 10 mL  10  mL Intravenous PRN Curt Bears, MD        SURGICAL HISTORY:  Past Surgical History:  Procedure Laterality Date  . ABDOMINAL HYSTERECTOMY    . COLOSTOMY TAKEDOWN N/A 07/10/2014   Procedure: LAPAROSCOPIC LYSIS OF ADHESIONS (90 MIN) LAPAROSCOPIC ASSISTED COLOSTOMY CLOSURE, RIGID PROCTOSIGMOIDOSCOPY;  Surgeon: Jackolyn Confer, MD;  Location: [facility];  Service: General;  Laterality: N/A;  . LAPAROTOMY N/A 11/03/2013   Procedure: EXPLORATORY LAPAROTOMY, DRAINAGE OF INTRA  ABDOMINAL ABSCESSES, MOBILIZATION OF SPLENIC FLEXURE, SIGMOID COLECTOMY WITH COLOSTOMY;  Surgeon: Odis Hollingshead, MD;  Location: [facility];  Service: General;  Laterality: N/A;  . VIDEO BRONCHOSCOPY Bilateral 08/30/2013   Procedure: VIDEO BRONCHOSCOPY WITH FLUORO;  Surgeon: Tanda Rockers, MD;  Location: [facility];  Service: Cardiopulmonary;  Laterality: Bilateral;    REVIEW OF SYSTEMS:  A comprehensive review of systems was negative.   PHYSICAL EXAMINATION: General appearance: alert, cooperative and no distress Head: Normocephalic, without obvious abnormality, atraumatic Neck: no adenopathy, no JVD, supple, symmetrical, trachea midline and thyroid not enlarged, symmetric, no tenderness/mass/nodules Lymph nodes: Cervical, supraclavicular, and axillary nodes normal. Resp: clear to auscultation bilaterally Back: symmetric, no curvature. ROM normal. No CVA tenderness. Cardio: regular rate and rhythm, S1, S2 normal, no murmur, click, rub or gallop GI: soft, non-tender; bowel sounds normal; no masses,  no organomegaly Extremities: extremities normal, atraumatic, no cyanosis or edema   ECOG PERFORMANCE STATUS: 1 - Symptomatic but completely ambulatory  Blood pressure (!) 139/57, pulse (!) 104, temperature 97.9 F (36.6 C), temperature source Temporal, resp. rate 18, height _0  (1.676 m), weight 189 lb 4.8 oz (85.9 kg).  LABORATORY DATA: Lab Results  Component Value Date   WBC 10.0 02/24/2020   HGB 12.1 02/24/2020   HCT  37.9 02/24/2020   MCV 98.7 02/24/2020   PLT 318 02/24/2020      Chemistry      Component Value Date/Time   NA 138 02/03/2020 0750   NA 139 08/14/2017 0837   K 4.2 02/03/2020 0750   K 4.4 08/14/2017 0837   CL 106 02/03/2020 0750   CO2 19 (L) 02/03/2020 0750   CO2 24 08/14/2017 0837   BUN 15 02/03/2020 0750   BUN 13.3 08/14/2017 0837   CREATININE 0.86 02/03/2020 0750   CREATININE 0.75 12/02/2019 0753   CREATININE 0.8 08/14/2017 0837      Component Value Date/Time   CALCIUM 9.8 02/03/2020 0750   CALCIUM 9.3 08/14/2017 0837   ALKPHOS 93 02/03/2020 0750   ALKPHOS 107 08/14/2017 0837   AST 10 (L) 02/03/2020 0750   AST 17 12/02/2019 0753   AST 12 08/14/2017 0837   ALT 16 02/03/2020 0750   ALT 16 12/02/2019 0753   ALT 12 08/14/2017 0837   BILITOT 0.5 02/03/2020 0750   BILITOT 0.4 12/02/2019 0753   BILITOT 0.37  08/14/2017 0837       RADIOGRAPHIC STUDIES: No results found.  ASSESSMENT AND PLAN:  This is a very pleasant 69 years old white female with metastatic non-small cell lung cancer, adenocarcinoma status post induction systemic chemotherapy with carboplatin and Alimta with partial response. The patient is currently on maintenance treatment with single agent Alimta status post 99 cycles. The patient continues to tolerate this treatment well with no concerning adverse effects. I recommended for her to proceed with cycle #100 today as planned. I will see her back for follow-up visit in 3 weeks for evaluation before the next cycle of her treatment with repeat CT scan of the chest, abdomen and pelvis for restaging of her disease. She was advised to call immediately if she has any concerning symptoms in the interval. The patient voices understanding of current disease status and treatment options and is in agreement with the current care plan. All questions were answered. The patient knows to call the clinic with any problems, questions or concerns. We can certainly see the  patient much sooner if necessary.  Disclaimer: This note was dictated with voice recognition software. Similar sounding words can inadvertently be transcribed and may not be corrected upon review.

## 2020-02-24 NOTE — Progress Notes (Signed)
I spoke with [name] today in clinic.  No barriers identified, offered psycho-social support.

## 2020-03-10 ENCOUNTER — Other Ambulatory Visit: Payer: Self-pay | Admitting: Physician Assistant

## 2020-03-10 DIAGNOSIS — C3411 Malignant neoplasm of upper lobe, right bronchus or lung: Secondary | ICD-10-CM

## 2020-03-10 DIAGNOSIS — C7931 Secondary malignant neoplasm of brain: Secondary | ICD-10-CM

## 2020-03-10 DIAGNOSIS — C7949 Secondary malignant neoplasm of other parts of nervous system: Secondary | ICD-10-CM

## 2020-03-12 ENCOUNTER — Other Ambulatory Visit: Payer: Self-pay

## 2020-03-12 ENCOUNTER — Ambulatory Visit
Admission: RE | Admit: 2020-03-12 | Discharge: 2020-03-12 | Disposition: A | Discharge status: Home / Self Care | Payer: Medicare Other | Source: Ambulatory Visit | Attending: Internal Medicine | Admitting: Internal Medicine

## 2020-03-12 DIAGNOSIS — C3490 Malignant neoplasm of unspecified part of unspecified bronchus or lung: Secondary | ICD-10-CM

## 2020-03-12 DIAGNOSIS — C7931 Secondary malignant neoplasm of brain: Secondary | ICD-10-CM | POA: Diagnosis not present

## 2020-03-12 MED ORDER — IOPAMIDOL (ISOVUE-300) INJECTION 61%
100.0000 mL | Freq: Once | INTRAVENOUS | Status: AC | PRN
  Administered 2020-03-12: 100 mL via INTRAVENOUS

## 2020-03-12 NOTE — Progress Notes (Signed)
[facility] OFFICE PROGRESS NOTE  [name], MD [address] [place] [address]  DIAGNOSIS: Stage IV (T2a, N0, M1b) non-small cell lung cancer, adenocarcinoma with negative EGFR and ALK mutations diagnosed in January 2015 and presented with right upper lobe lung mass in addition to a solitary brain metastasis  PRIOR THERAPY: 1) Status post stereotactic radiotherapy to a solitary right parietal brain lesion under the care of Dr. [name] on 10/16/2013. 2) Status post palliative radiotherapy to the right lung tumor under the care of Dr. [name] completed on 12/05/2013. 3) Systemic chemotherapy with carboplatin for AUC of 5 and Alimta 500 mg/M2 every 3 weeks. First dose Jan 06 2014. Status post 6 cycles.  CURRENT THERAPY: Systemic chemotherapy with maintenance Alimta 500 MG/M2 every 3 weeks, status post100cycles.  INTERVAL HISTORY: Alexis [name] 69 y.o. female returns to the clinic for a follow up visit accompanied by her husband. The patient is feeling well today without any concerning complaints except for occasional catching feeling on the right lateral part of her abdomen which is worse with activity. No abnormalities were appreciated on prior imaging studies. The patient continues to tolerate treatment with single agent Alimta well without any adverse effects except flushing from the steroids. Denies any fever, chills, night sweats, or weight loss. Denies any chest pain, cough, or hemoptysis. She reports shortness of breath in the heat. Denies any nausea, vomiting, or diarrhea. She has mild constipation after chemotherapy which is managed successfully with a stool softener. Denies any headache or visual changes.The patient is here today for evaluation prior to starting cycle # 101   MEDICAL HISTORY: Past Medical History:  Diagnosis Date  . Anxiety   . Anxiety 06/20/2016  . Cervical cancer ([facility])   . Encounter for antineoplastic chemotherapy 07/20/2015  . Malignant  neoplasm of right upper lobe of lung (HCC)     non small cell lung cancer adenocarcioma with brain meta    ALLERGIES:  is allergic to codeine.  MEDICATIONS:  Current Outpatient Medications  Medication Sig Dispense Refill  . acetaminophen (TYLENOL) 500 MG tablet Take 500 mg by mouth every 6 (six) hours as needed for mild pain or headache. Reported on 11/02/2015    . ALPRAZolam (XANAX) 1 MG tablet SMARTSIG:1 Tablet(s) By Mouth 1 to 3 Times Daily PRN    . Ascorbic Acid (VITAMIN C GUMMIE PO) Take 1 each by mouth every morning.    Marland Kitchen aspirin EC 81 MG tablet Take 1 tablet (81 mg total) by mouth daily. 150 tablet 2  . [employer] ACID CONTROLLER 10 MG tablet SMARTSIG:1 Tablet(s) By Mouth Every 12 Hours PRN    . dexamethasone (DECADRON) 4 MG tablet TAKE 1 PILL TWICE A DAY THE DAY BEFORE, THE DAY OF, AND THE DAY AFTER CHEMOTHERAPY. 40 tablet 2  . folic acid (FOLVITE) 1 MG tablet TAKE 1 TABLET BY MOUTH EVERY DAY 90 tablet 0  . HYDROcodone-acetaminophen (NORCO/VICODIN) 5-325 MG tablet TAKE 1 TABLET BY MOUTH 3 TIMES A DAY FOR 5 DAYS AS NEEDED FOR PAIN    . Multiple Vitamin (MULTIVITAMIN WITH MINERALS) TABS tablet Take 1 tablet by mouth every morning.    . ondansetron (ZOFRAN) 8 MG tablet TAKE 1 TABLET BY MOUTH BEFORE CHEMO 30 tablet 0  . OVER THE COUNTER MEDICATION Take 1 tablet by mouth every morning. (Vitamin A)    . prochlorperazine (COMPAZINE) 10 MG tablet Take 1 tablet (10 mg total) by mouth every 6 (six) hours as needed for nausea  or vomiting. 60 tablet 0  . rosuvastatin (CRESTOR) 10 MG tablet Take 1 tablet (10 mg total) by mouth daily. 30 tablet 12  . senna-docusate (SENOKOT-S) 8.6-50 MG tablet Take 1 tablet by mouth daily. 30 tablet prn  . valACYclovir (VALTREX) 1000 MG tablet Take 1,000 mg by mouth 3 (three) times daily.    . Vitamin D, Ergocalciferol, (DRISDOL) 1.25 MG (50000 UNIT) CAPS capsule Take 50,000 Units by mouth once a week.     No current facility-administered medications for this visit.    Facility-Administered Medications Ordered in Other Visits  Medication Dose Route Frequency Provider Last Rate Last Admin  . sodium chloride flush (NS) 0.9 % injection 10 mL  10 mL Intravenous PRN Curt Bears, MD      . sodium chloride flush (NS) 0.9 % injection 10 mL  10 mL Intravenous PRN Curt Bears, MD        SURGICAL HISTORY:  Past Surgical History:  Procedure Laterality Date  . ABDOMINAL HYSTERECTOMY    . COLOSTOMY TAKEDOWN N/A 07/10/2014   Procedure: LAPAROSCOPIC LYSIS OF ADHESIONS (90 MIN) LAPAROSCOPIC ASSISTED COLOSTOMY CLOSURE, RIGID PROCTOSIGMOIDOSCOPY;  Surgeon: Jackolyn Confer, MD;  Location: [facility];  Service: General;  Laterality: N/A;  . LAPAROTOMY N/A 11/03/2013   Procedure: EXPLORATORY LAPAROTOMY, DRAINAGE OF INTRA  ABDOMINAL ABSCESSES, MOBILIZATION OF SPLENIC FLEXURE, SIGMOID COLECTOMY WITH COLOSTOMY;  Surgeon: Odis Hollingshead, MD;  Location: [facility];  Service: General;  Laterality: N/A;  . VIDEO BRONCHOSCOPY Bilateral 08/30/2013   Procedure: VIDEO BRONCHOSCOPY WITH FLUORO;  Surgeon: Tanda Rockers, MD;  Location: [facility];  Service: Cardiopulmonary;  Laterality: Bilateral;    REVIEW OF SYSTEMS:   Review of Systems  Constitutional: Negative for appetite change, chills, fatigue, fever and unexpected weight change.  HENT: Negative for mouth sores, nosebleeds, sore throat and trouble swallowing.   Eyes: Negative for eye problems and icterus.  Respiratory: Positive for mild shortness of breath. Negative for cough, hemoptysis, and wheezing.   Cardiovascular: Negative for chest pain and leg swelling.  Gastrointestinal: Positive for right sided "catching" sensation. Positive for constipation following treatment. Negative for abdominal pain, diarrhea, nausea and vomiting.  Genitourinary: Negative for bladder incontinence, difficulty urinating, dysuria, frequency and hematuria.   Musculoskeletal: Positive for occasional mild "catching" sensation in her right  lumbar/lateral abdomen. Negative for back pain, gait problem, neck pain and neck stiffness.  Skin: Negative for itching and rash.  Neurological: Negative for dizziness, extremity weakness, gait problem, headaches, light-headedness and seizures.  Hematological: Negative for adenopathy. Does not bruise/bleed easily.  Psychiatric/Behavioral: Negative for confusion, depression and sleep disturbance. The patient is not nervous/anxious. nervous/anxious.     PHYSICAL EXAMINATION:  Blood pressure (!) 148/67, pulse 100, temperature 97.8 F (36.6 C), temperature source Temporal, resp. rate 18, height 5' 6"  (1.676 m), weight 189 lb 1.6 oz (85.8 kg), SpO2 97 %.  ECOG PERFORMANCE STATUS: 1 - Symptomatic but completely ambulatory  Physical Exam  Constitutional: Oriented to person, place, and time and well-developed, well-nourished, and in no distress.  HENT:  Head: Normocephalic and atraumatic.  Mouth/Throat: Oropharynx is clear and moist. No oropharyngeal exudate.  Eyes: Conjunctivae are normal. Right eye exhibits no discharge. Left eye exhibits no discharge. No scleral icterus.  Neck: Normal range of motion. Neck supple.  Cardiovascular: Normal rate, regular rhythm, normal heart sounds and intact distal pulses.   Pulmonary/Chest: Effort normal and breath sounds normal. No respiratory distress. No wheezes. No rales.  Abdominal: Soft. Bowel sounds are normal. Exhibits no distension and  no mass. There is no tenderness.  Musculoskeletal: Normal range of motion. Exhibits no edema.  Lymphadenopathy:    No cervical adenopathy.  Neurological: Alert and oriented to person, place, and time. Exhibits normal muscle tone. Gait normal. Coordination normal.  Skin: Skin is warm and dry. No rash noted. Not diaphoretic. No erythema. No pallor.  Psychiatric: Mood, memory and judgment normal.  Vitals reviewed.  LABORATORY DATA: Lab Results  Component Value Date   WBC 13.6 (H) 03/16/2020   HGB 12.2 03/16/2020    HCT 38.2 03/16/2020   MCV 100.0 03/16/2020   PLT 346 03/16/2020      Chemistry      Component Value Date/Time   NA 138 03/16/2020 0935   NA 139 08/14/2017 0837   K 4.4 03/16/2020 0935   K 4.4 08/14/2017 0837   CL 106 03/16/2020 0935   CO2 21 (L) 03/16/2020 0935   CO2 24 08/14/2017 0837   BUN 18 03/16/2020 0935   BUN 13.3 08/14/2017 0837   CREATININE 0.89 03/16/2020 0935   CREATININE 0.75 12/02/2019 0753   CREATININE 0.8 08/14/2017 0837      Component Value Date/Time   CALCIUM 9.6 03/16/2020 0935   CALCIUM 9.3 08/14/2017 0837   ALKPHOS 95 03/16/2020 0935   ALKPHOS 107 08/14/2017 0837   AST 12 (L) 03/16/2020 0935   AST 17 12/02/2019 0753   AST 12 08/14/2017 0837   ALT 14 03/16/2020 0935   ALT 16 12/02/2019 0753   ALT 12 08/14/2017 0837   BILITOT 0.4 03/16/2020 0935   BILITOT 0.4 12/02/2019 0753   BILITOT 0.37 08/14/2017 0837       RADIOGRAPHIC STUDIES:  CT Chest W Contrast  Result Date: 03/12/2020 CLINICAL DATA:  Primary Cancer Type: Lung Imaging Indication: Routine surveillance Interval therapy since last imaging? Yes Initial Cancer Diagnosis Date: 08/30/2013; Established by: Biopsy-proven Detailed Pathology: Stage IV non-small cell lung cancer, adenocarcinoma. Primary Tumor location: Right upper lobe. Solitary brain metastasis. Surgeries: Sigmoid colectomy 11/03/2013; colostomy closure 07/10/2014. Hysterectomy. Chemotherapy: Yes; Ongoing? Yes; Most recent administration: 02/24/2020 Immunotherapy? No Radiation therapy? Yes Date Range: 10/28/2013-12/05/2013; Target: Right lung Date Range: 10/16/2013; Target: Right parietal brain lesion EXAM: CT CHEST, ABDOMEN, AND PELVIS WITH CONTRAST TECHNIQUE: Multidetector CT imaging of the chest, abdomen and pelvis was performed following the standard protocol during bolus administration of intravenous contrast. CONTRAST:  132m ISOVUE-300 IOPAMIDOL (ISOVUE-300) INJECTION 61% COMPARISON:  Most recent CT chest, abdomen and pelvis  11/29/2019. 09/09/2013 PET-CT. FINDINGS: CT CHEST FINDINGS Cardiovascular: Normal heart size. No significant pericardial effusion/thickening. Atherosclerotic nonaneurysmal thoracic aorta. Normal caliber pulmonary arteries. No central pulmonary emboli. Mediastinum/Nodes: No discrete thyroid nodules. Unremarkable esophagus. No pathologically enlarged axillary, mediastinal or hilar lymph nodes. Lungs/Pleura: No pneumothorax. No pleural effusion. Moderate centrilobular and paraseptal emphysema with mild diffuse bronchial wall thickening. Sharply marginated medial apical right upper lobe consolidation with associated volume loss, unchanged, compatible with radiation fibrosis. New patchy peribronchovascular right upper lobe ground-glass opacities (series 6/image 57-59). Posterior right lower lobe 6 mm ground-glass pulmonary nodule (series 6/image 98) is stable. No additional significant pulmonary nodules. Musculoskeletal: No aggressive appearing focal osseous lesions. Mild thoracic spondylosis. CT ABDOMEN PELVIS FINDINGS Hepatobiliary: Normal liver with no liver mass. Normal gallbladder with no radiopaque cholelithiasis. No biliary ductal dilatation. Pancreas: Normal, with no mass or duct dilation. Spleen: Normal size. No mass. Adrenals/Urinary Tract: Normal adrenals. Normal kidneys with no hydronephrosis and no renal mass. Normal bladder. Stomach/Bowel: Normal non-distended stomach. Normal caliber small bowel with no small bowel wall thickening.  Normal appendix. Oral contrast transits to the colon. Postsurgical changes from distal colectomy with intact appearing distal colonic anastomosis. No large bowel wall thickening, significant diverticulosis or acute pericolonic fat stranding. Vascular/Lymphatic: Atherosclerotic nonaneurysmal abdominal aorta. Patent portal, splenic, hepatic and renal veins. No pathologically enlarged lymph nodes in the abdomen or pelvis. Reproductive: Status post hysterectomy, with no abnormal  findings at the vaginal cuff. No adnexal mass. Other: No pneumoperitoneum, ascites or focal fluid collection. Stable small fat containing periumbilical hernia. Musculoskeletal: No aggressive appearing focal osseous lesions. Mild lumbar spondylosis. IMPRESSION: 1. Stable apical right upper lobe radiation fibrosis with no definite findings of local tumor recurrence. 2. No findings highly suspicious for recurrent metastatic disease in the chest, abdomen or pelvis. 3. New mild patchy peribronchovascular right upper lobe ground-glass opacities, nonspecific, potentially inflammatory. Suggest attention on follow-up chest CT in 3-6 months. 4. Aortic Atherosclerosis (ICD10-I70.0) and Emphysema (ICD10-J43.9). Electronically Signed   By: Delbert Phenix M.D.   On: 03/12/2020 10:27   CT Abdomen Pelvis W Contrast  Result Date: 03/12/2020 CLINICAL DATA:  Primary Cancer Type: Lung Imaging Indication: Routine surveillance Interval therapy since last imaging? Yes Initial Cancer Diagnosis Date: 08/30/2013; Established by: Biopsy-proven Detailed Pathology: Stage IV non-small cell lung cancer, adenocarcinoma. Primary Tumor location: Right upper lobe. Solitary brain metastasis. Surgeries: Sigmoid colectomy 11/03/2013; colostomy closure 07/10/2014. Hysterectomy. Chemotherapy: Yes; Ongoing? Yes; Most recent administration: 02/24/2020 Immunotherapy? No Radiation therapy? Yes Date Range: 10/28/2013-12/05/2013; Target: Right lung Date Range: 10/16/2013; Target: Right parietal brain lesion EXAM: CT CHEST, ABDOMEN, AND PELVIS WITH CONTRAST TECHNIQUE: Multidetector CT imaging of the chest, abdomen and pelvis was performed following the standard protocol during bolus administration of intravenous contrast. CONTRAST:  ISOVUE-300 IOPAMIDOL (ISOVUE-300) INJECTION 61% COMPARISON:  Most recent CT chest, abdomen and pelvis 11/29/2019. 09/09/2013 PET-CT. FINDINGS: CT CHEST FINDINGS Cardiovascular: Normal heart size. No significant pericardial  effusion/thickening. Atherosclerotic nonaneurysmal thoracic aorta. Normal caliber pulmonary arteries. No central pulmonary emboli. Mediastinum/Nodes: No discrete thyroid nodules. Unremarkable esophagus. No pathologically enlarged axillary, mediastinal or hilar lymph nodes. Lungs/Pleura: No pneumothorax. No pleural effusion. Moderate centrilobular and paraseptal emphysema with mild diffuse bronchial wall thickening. Sharply marginated medial apical right upper lobe consolidation with associated volume loss, unchanged, compatible with radiation fibrosis. New patchy peribronchovascular right upper lobe ground-glass opacities (series 6/image 57-59). Posterior right lower lobe 6 mm ground-glass pulmonary nodule (series 6/image 98) is stable. No additional significant pulmonary nodules. Musculoskeletal: No aggressive appearing focal osseous lesions. Mild thoracic spondylosis. CT ABDOMEN PELVIS FINDINGS Hepatobiliary: Normal liver with no liver mass. Normal gallbladder with no radiopaque cholelithiasis. No biliary ductal dilatation. Pancreas: Normal, with no mass or duct dilation. Spleen: Normal size. No mass. Adrenals/Urinary Tract: Normal adrenals. Normal kidneys with no hydronephrosis and no renal mass. Normal bladder. Stomach/Bowel: Normal non-distended stomach. Normal caliber small bowel with no small bowel wall thickening. Normal appendix. Oral contrast transits to the colon. Postsurgical changes from distal colectomy with intact appearing distal colonic anastomosis. No large bowel wall thickening, significant diverticulosis or acute pericolonic fat stranding. Vascular/Lymphatic: Atherosclerotic nonaneurysmal abdominal aorta. Patent portal, splenic, hepatic and renal veins. No pathologically enlarged lymph nodes in the abdomen or pelvis. Reproductive: Status post hysterectomy, with no abnormal findings at the vaginal cuff. No adnexal mass. Other: No pneumoperitoneum, ascites or focal fluid collection. Stable small  fat containing periumbilical hernia. Musculoskeletal: No aggressive appearing focal osseous lesions. Mild lumbar spondylosis. IMPRESSION: 1. Stable apical right upper lobe radiation fibrosis with no definite findings of local tumor recurrence. 2. No findings highly suspicious  for recurrent metastatic disease in the chest, abdomen or pelvis. 3. New mild patchy peribronchovascular right upper lobe ground-glass opacities, nonspecific, potentially inflammatory. Suggest attention on follow-up chest CT in 3-6 months. 4. Aortic Atherosclerosis (ICD10-I70.0) and Emphysema (ICD10-J43.9). Electronically Signed   By: Ilona Sorrel M.D.   On: 03/12/2020 10:27     ASSESSMENT/PLAN:  This isa very pleasant 69 year old Caucasian female with stage IV non-small cell lung cancer, adenocarcinoma who presented with a right upper lobe lungmass in addition to a solitary brain metastatsis. She was diagnosed in January 2015. The patient had completed induction systemic chemotherapy with carboplatin and Alimta with a partial response. The patient is currently being treated with single agentmaintenanceAlimta. She is status post100cycles  The patient recently had a restaging CT scan performed. Dr. Julien Nordmann personally and independently reviewed the scan and discussed the results with the patient. The scan did not show any evidence of disease progression.Recommend that she proceed with cycle #101 today as scheduled.   She will continue to take her decadron the day before, the day of, and the day after chemotherapy.  We will see her back for a follow up visit in 3 weeks for evaluation before starting cycle #102.   No concerning etiology noted on imaging to explain the catching sensation in her right abdomen. Likely secondary to scar tissue from her prior abdominal surgeries or musculoskeletal.   The patient was advised to call immediately if she has any concerning symptoms in the interval. The patient voices  understanding of current disease status and treatment options and is in agreement with the current care plan. All questions were answered. The patient knows to call the clinic with any problems, questions or concerns. We can certainly see the patient much sooner if necessary  No orders of the defined types were placed in this encounter.    Milliani Herrada L Trana Ressler, PA-C 03/16/20  ADDENDUM: Hematology/Oncology Attending:  I had a face-to-face encounter with the patient today.  I recommended her care plan.  This is a very pleasant 69 years old white female with metastatic non-small cell lung cancer, adenocarcinoma status post induction systemic chemotherapy with carboplatin and Alimta and she has been on maintenance treatment with Alimta status post 100 cycle. The patient came to the clinic today accompanied by her husband.  She is feeling fine with no concerning complaints. She had repeat CT scan of the chest, abdomen pelvis performed recently.  I personally and independently reviewed the scans and discussed the results with the patient and her husband today. Her scan showed no concerning findings for disease progression. I recommended for the patient to continue her current treatment with maintenance Alimta and she will proceed with cycle 101 today. She will come back for follow-up visit in 3 weeks for evaluation before the next cycle of her treatment. She was advised to call immediately if she has any concerning symptoms in the interval.  Disclaimer: This note was dictated with voice recognition software. Similar sounding words can inadvertently be transcribed and may be missed upon review. Eilleen Kempf, MD 03/16/20

## 2020-03-13 ENCOUNTER — Other Ambulatory Visit: Payer: Self-pay | Admitting: Internal Medicine

## 2020-03-13 DIAGNOSIS — C3490 Malignant neoplasm of unspecified part of unspecified bronchus or lung: Secondary | ICD-10-CM

## 2020-03-16 ENCOUNTER — Encounter: Payer: Self-pay | Admitting: Physician Assistant

## 2020-03-16 ENCOUNTER — Inpatient Hospital Stay (HOSPITAL_BASED_OUTPATIENT_CLINIC_OR_DEPARTMENT_OTHER): Payer: Medicare Other | Admitting: Physician Assistant

## 2020-03-16 ENCOUNTER — Inpatient Hospital Stay: Payer: Medicare Other

## 2020-03-16 ENCOUNTER — Other Ambulatory Visit: Payer: Self-pay

## 2020-03-16 ENCOUNTER — Inpatient Hospital Stay: Payer: Medicare Other | Attending: Internal Medicine

## 2020-03-16 VITALS — BP 148/67 | HR 100 | Temp 97.8°F | Resp 18 | Ht 66.0 in | Wt 189.1 lb

## 2020-03-16 DIAGNOSIS — Z9221 Personal history of antineoplastic chemotherapy: Secondary | ICD-10-CM | POA: Insufficient documentation

## 2020-03-16 DIAGNOSIS — Z5112 Encounter for antineoplastic immunotherapy: Secondary | ICD-10-CM | POA: Diagnosis not present

## 2020-03-16 DIAGNOSIS — Z79899 Other long term (current) drug therapy: Secondary | ICD-10-CM | POA: Diagnosis not present

## 2020-03-16 DIAGNOSIS — C3411 Malignant neoplasm of upper lobe, right bronchus or lung: Secondary | ICD-10-CM

## 2020-03-16 DIAGNOSIS — Z923 Personal history of irradiation: Secondary | ICD-10-CM | POA: Diagnosis not present

## 2020-03-16 DIAGNOSIS — Z5111 Encounter for antineoplastic chemotherapy: Secondary | ICD-10-CM

## 2020-03-16 DIAGNOSIS — C7931 Secondary malignant neoplasm of brain: Secondary | ICD-10-CM | POA: Insufficient documentation

## 2020-03-16 DIAGNOSIS — Z7952 Long term (current) use of systemic steroids: Secondary | ICD-10-CM | POA: Diagnosis not present

## 2020-03-16 LAB — CBC WITH DIFFERENTIAL/PLATELET
Abs Immature Granulocytes: 0.1 10*3/uL — ABNORMAL HIGH (ref 0.00–0.07)
Basophils Absolute: 0 10*3/uL (ref 0.0–0.1)
Basophils Relative: 0 %
Eosinophils Absolute: 0 10*3/uL (ref 0.0–0.5)
Eosinophils Relative: 0 %
HCT: 38.2 % (ref 36.0–46.0)
Hemoglobin: 12.2 g/dL (ref 12.0–15.0)
Immature Granulocytes: 1 %
Lymphocytes Relative: 4 %
Lymphs Abs: 0.5 10*3/uL — ABNORMAL LOW (ref 0.7–4.0)
MCH: 31.9 pg (ref 26.0–34.0)
MCHC: 31.9 g/dL (ref 30.0–36.0)
MCV: 100 fL (ref 80.0–100.0)
Monocytes Absolute: 0.6 10*3/uL (ref 0.1–1.0)
Monocytes Relative: 5 %
Neutro Abs: 12.4 10*3/uL — ABNORMAL HIGH (ref 1.7–7.7)
Neutrophils Relative %: 90 %
Platelets: 346 10*3/uL (ref 150–400)
RBC: 3.82 MIL/uL — ABNORMAL LOW (ref 3.87–5.11)
RDW: 15.3 % (ref 11.5–15.5)
WBC: 13.6 10*3/uL — ABNORMAL HIGH (ref 4.0–10.5)
nRBC: 0 % (ref 0.0–0.2)

## 2020-03-16 LAB — COMPREHENSIVE METABOLIC PANEL
ALT: 14 U/L (ref 0–44)
AST: 12 U/L — ABNORMAL LOW (ref 15–41)
Albumin: 3.4 g/dL — ABNORMAL LOW (ref 3.5–5.0)
Alkaline Phosphatase: 95 U/L (ref 38–126)
Anion gap: 11 (ref 5–15)
BUN: 18 mg/dL (ref 8–23)
CO2: 21 mmol/L — ABNORMAL LOW (ref 22–32)
Calcium: 9.6 mg/dL (ref 8.9–10.3)
Chloride: 106 mmol/L (ref 98–111)
Creatinine, Ser: 0.89 mg/dL (ref 0.44–1.00)
GFR calc Af Amer: >60 mL/min (ref >60)
GFR calc non Af Amer: >60 mL/min (ref >60)
Glucose, Bld: 252 mg/dL — ABNORMAL HIGH (ref 70–99)
Potassium: 4.4 mmol/L (ref 3.5–5.1)
Sodium: 138 mmol/L (ref 135–145)
Total Bilirubin: 0.4 mg/dL (ref 0.3–1.2)
Total Protein: 7 g/dL (ref 6.5–8.1)

## 2020-03-16 MED ORDER — SODIUM CHLORIDE 0.9 % IV SOLN
Freq: Once | INTRAVENOUS | Status: AC
  Filled 2020-03-16: qty 250

## 2020-03-16 MED ORDER — CYANOCOBALAMIN 1000 MCG/ML IJ SOLN
INTRAMUSCULAR | Status: AC
  Filled 2020-03-16: qty 1

## 2020-03-16 MED ORDER — SODIUM CHLORIDE 0.9 % IV SOLN
10.0000 mg | Freq: Once | INTRAVENOUS | Status: AC
  Administered 2020-03-16: 10 mg via INTRAVENOUS
  Filled 2020-03-16: qty 10

## 2020-03-16 MED ORDER — SODIUM CHLORIDE 0.9 % IV SOLN
500.0000 mg/m2 | Freq: Once | INTRAVENOUS | Status: AC
  Administered 2020-03-16: 900 mg via INTRAVENOUS
  Filled 2020-03-16: qty 20

## 2020-03-16 MED ORDER — CYANOCOBALAMIN 1000 MCG/ML IJ SOLN
1000.0000 ug | Freq: Once | INTRAMUSCULAR | Status: AC
  Administered 2020-03-16: 1000 ug via INTRAMUSCULAR

## 2020-03-16 NOTE — Patient Instructions (Signed)
[facility] Discharge Instructions for Patients Receiving Chemotherapy  Today you received the following chemotherapy agents: Alimta  To help prevent nausea and vomiting after your treatment, we encourage you to take your nausea medication as directed.    If you develop nausea and vomiting that is not controlled by your nausea medication, call the clinic.   BELOW ARE SYMPTOMS THAT SHOULD BE REPORTED IMMEDIATELY:  *FEVER GREATER THAN 100.5 F  *CHILLS WITH OR WITHOUT FEVER  NAUSEA AND VOMITING THAT IS NOT CONTROLLED WITH YOUR NAUSEA MEDICATION  *UNUSUAL SHORTNESS OF BREATH  *UNUSUAL BRUISING OR BLEEDING  TENDERNESS IN MOUTH AND THROAT WITH OR WITHOUT PRESENCE OF ULCERS  *URINARY PROBLEMS  *BOWEL PROBLEMS  UNUSUAL RASH Items with * indicate a potential emergency and should be followed up as soon as possible.  Feel free to call the clinic should you have any questions or concerns. The clinic phone number is [phone].  Please show the [facility] at check-in to the Emergency Department and triage nurse.

## 2020-04-06 ENCOUNTER — Other Ambulatory Visit: Payer: Medicare Other

## 2020-04-06 ENCOUNTER — Other Ambulatory Visit: Payer: Self-pay

## 2020-04-06 ENCOUNTER — Encounter: Payer: Self-pay | Admitting: Internal Medicine

## 2020-04-06 ENCOUNTER — Inpatient Hospital Stay: Payer: Medicare Other

## 2020-04-06 ENCOUNTER — Inpatient Hospital Stay: Payer: Medicare Other | Attending: Internal Medicine | Admitting: Internal Medicine

## 2020-04-06 ENCOUNTER — Telehealth: Payer: Self-pay | Admitting: Internal Medicine

## 2020-04-06 VITALS — BP 118/44 | HR 100 | Temp 98.2°F | Resp 18 | Ht 66.0 in | Wt 189.0 lb

## 2020-04-06 DIAGNOSIS — C7931 Secondary malignant neoplasm of brain: Secondary | ICD-10-CM | POA: Insufficient documentation

## 2020-04-06 DIAGNOSIS — Z79899 Other long term (current) drug therapy: Secondary | ICD-10-CM | POA: Diagnosis not present

## 2020-04-06 DIAGNOSIS — C3411 Malignant neoplasm of upper lobe, right bronchus or lung: Secondary | ICD-10-CM

## 2020-04-06 DIAGNOSIS — Z923 Personal history of irradiation: Secondary | ICD-10-CM | POA: Diagnosis not present

## 2020-04-06 DIAGNOSIS — Z5112 Encounter for antineoplastic immunotherapy: Secondary | ICD-10-CM | POA: Diagnosis not present

## 2020-04-06 DIAGNOSIS — D6481 Anemia due to antineoplastic chemotherapy: Secondary | ICD-10-CM

## 2020-04-06 DIAGNOSIS — Z9221 Personal history of antineoplastic chemotherapy: Secondary | ICD-10-CM | POA: Diagnosis not present

## 2020-04-06 DIAGNOSIS — Z5111 Encounter for antineoplastic chemotherapy: Secondary | ICD-10-CM

## 2020-04-06 DIAGNOSIS — T451X5A Adverse effect of antineoplastic and immunosuppressive drugs, initial encounter: Secondary | ICD-10-CM

## 2020-04-06 LAB — CBC WITH DIFFERENTIAL/PLATELET
Abs Immature Granulocytes: 0.06 10*3/uL (ref 0.00–0.07)
Basophils Absolute: 0 10*3/uL (ref 0.0–0.1)
Basophils Relative: 0 %
Eosinophils Absolute: 0 10*3/uL (ref 0.0–0.5)
Eosinophils Relative: 0 %
HCT: 38.2 % (ref 36.0–46.0)
Hemoglobin: 12.4 g/dL (ref 12.0–15.0)
Immature Granulocytes: 1 %
Lymphocytes Relative: 4 %
Lymphs Abs: 0.5 10*3/uL — ABNORMAL LOW (ref 0.7–4.0)
MCH: 32.6 pg (ref 26.0–34.0)
MCHC: 32.5 g/dL (ref 30.0–36.0)
MCV: 100.5 fL — ABNORMAL HIGH (ref 80.0–100.0)
Monocytes Absolute: 0.5 10*3/uL (ref 0.1–1.0)
Monocytes Relative: 5 %
Neutro Abs: 10.2 10*3/uL — ABNORMAL HIGH (ref 1.7–7.7)
Neutrophils Relative %: 90 %
Platelets: 283 10*3/uL (ref 150–400)
RBC: 3.8 MIL/uL — ABNORMAL LOW (ref 3.87–5.11)
RDW: 15.7 % — ABNORMAL HIGH (ref 11.5–15.5)
WBC: 11.3 10*3/uL — ABNORMAL HIGH (ref 4.0–10.5)
nRBC: 0 % (ref 0.0–0.2)

## 2020-04-06 LAB — COMPREHENSIVE METABOLIC PANEL
ALT: 16 U/L (ref 0–44)
AST: 11 U/L — ABNORMAL LOW (ref 15–41)
Albumin: 3.4 g/dL — ABNORMAL LOW (ref 3.5–5.0)
Alkaline Phosphatase: 100 U/L (ref 38–126)
Anion gap: 13 (ref 5–15)
BUN: 15 mg/dL (ref 8–23)
CO2: 21 mmol/L — ABNORMAL LOW (ref 22–32)
Calcium: 10 mg/dL (ref 8.9–10.3)
Chloride: 105 mmol/L (ref 98–111)
Creatinine, Ser: 0.87 mg/dL (ref 0.44–1.00)
GFR calc Af Amer: >60 mL/min (ref >60)
GFR calc non Af Amer: >60 mL/min (ref >60)
Glucose, Bld: 233 mg/dL — ABNORMAL HIGH (ref 70–99)
Potassium: 4.2 mmol/L (ref 3.5–5.1)
Sodium: 139 mmol/L (ref 135–145)
Total Bilirubin: 0.5 mg/dL (ref 0.3–1.2)
Total Protein: 6.9 g/dL (ref 6.5–8.1)

## 2020-04-06 MED ORDER — SODIUM CHLORIDE 0.9 % IV SOLN
10.0000 mg | Freq: Once | INTRAVENOUS | Status: AC
  Administered 2020-04-06: 10 mg via INTRAVENOUS
  Filled 2020-04-06: qty 10

## 2020-04-06 MED ORDER — SODIUM CHLORIDE 0.9 % IV SOLN
500.0000 mg/m2 | Freq: Once | INTRAVENOUS | Status: AC
  Administered 2020-04-06: 900 mg via INTRAVENOUS
  Filled 2020-04-06: qty 16

## 2020-04-06 MED ORDER — SODIUM CHLORIDE 0.9 % IV SOLN
Freq: Once | INTRAVENOUS | Status: AC
  Filled 2020-04-06: qty 250

## 2020-04-06 NOTE — Patient Instructions (Signed)
[facility] Discharge Instructions for Patients Receiving Chemotherapy  Today you received the following chemotherapy agents: Alimta  To help prevent nausea and vomiting after your treatment, we encourage you to take your nausea medication as directed.    If you develop nausea and vomiting that is not controlled by your nausea medication, call the clinic.   BELOW ARE SYMPTOMS THAT SHOULD BE REPORTED IMMEDIATELY:  *FEVER GREATER THAN 100.5 F  *CHILLS WITH OR WITHOUT FEVER  NAUSEA AND VOMITING THAT IS NOT CONTROLLED WITH YOUR NAUSEA MEDICATION  *UNUSUAL SHORTNESS OF BREATH  *UNUSUAL BRUISING OR BLEEDING  TENDERNESS IN MOUTH AND THROAT WITH OR WITHOUT PRESENCE OF ULCERS  *URINARY PROBLEMS  *BOWEL PROBLEMS  UNUSUAL RASH Items with * indicate a potential emergency and should be followed up as soon as possible.  Feel free to call the clinic should you have any questions or concerns. The clinic phone number is [phone].  Please show the [facility] at check-in to the Emergency Department and triage nurse.

## 2020-04-06 NOTE — Telephone Encounter (Signed)
Scheduled per 8/9 los. Printed avs and calendar for pt.

## 2020-04-06 NOTE — Progress Notes (Signed)
[place] [phone]   [phone]  OFFICE PROGRESS NOTE  [name], MD [address] [place] [address]  DIAGNOSIS: Stage IV (T2a, N0, M1b) non-small cell lung cancer, adenocarcinoma with negative EGFR and ALK mutations diagnosed in January 2015 and presented with right upper lobe lung mass in addition to a solitary brain metastasis.  PRIOR THERAPY: 1) Status post stereotactic radiotherapy to a solitary right parietal brain lesion under the care of Dr. [name] on 10/16/2013. 2) Status post palliative radiotherapy to the right lung tumor under the care of Dr. [name] completed on 12/05/2013. 3) Systemic chemotherapy with carboplatin for AUC of 5 and Alimta 500 mg/M2 every 3 weeks. First dose Jan 06 2014. Status post 6 cycles.  CURRENT THERAPY: Systemic chemotherapy with maintenance Alimta 500 MG/M2 every 3 weeks, status post 101 cycles.  INTERVAL HISTORY: Alexis [name] 69 y.o. female returns to the clinic today for follow-up visit.  The patient is feeling fine today with no concerning complaints.  She denied having any chest pain, shortness of breath, cough or hemoptysis.  She denied having any fever or chills.  She has no nausea, vomiting, diarrhea or constipation.  She has no headache or visual changes.  She continues to tolerate her systemic chemotherapy fairly well.  The patient is here today for evaluation before starting cycle #102.  MEDICAL HISTORY: Past Medical History:  Diagnosis Date  . Anxiety   . Anxiety 06/20/2016  . Cervical cancer ([place])   . Encounter for antineoplastic chemotherapy 07/20/2015  . 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  MEDICATIONS:  Current Outpatient Medications  Medication Sig Dispense Refill  . acetaminophen (TYLENOL) 500 MG tablet Take 500 mg by mouth every 6 (six) hours as needed for mild  pain or headache. Reported on 11/02/2015    . ALPRAZolam (XANAX) 1 MG tablet SMARTSIG:1 Tablet(s) By Mouth 1 to 3 Times Daily PRN    . Ascorbic Acid (VITAMIN C GUMMIE PO) Take 1 each by mouth every morning.    Marland Kitchen aspirin EC 81 MG tablet Take 1 tablet (81 mg total) by mouth daily. 150 tablet 2  . [employer] ACID CONTROLLER 10 MG tablet SMARTSIG:1 Tablet(s) By Mouth Every 12 Hours PRN    . dexamethasone (DECADRON) 4 MG tablet TAKE 1 PILL TWICE A DAY THE DAY BEFORE, THE DAY OF, AND THE DAY AFTER CHEMOTHERAPY. 40 tablet 2  . folic acid (FOLVITE) 1 MG tablet TAKE 1 TABLET BY MOUTH EVERY DAY 90 tablet 0  . HYDROcodone-acetaminophen (NORCO/VICODIN) 5-325 MG tablet TAKE 1 TABLET BY MOUTH 3 TIMES A DAY FOR 5 DAYS AS NEEDED FOR PAIN    . Multiple Vitamin (MULTIVITAMIN WITH MINERALS) TABS tablet Take 1 tablet by mouth every morning.    . ondansetron (ZOFRAN) 8 MG tablet TAKE 1 TABLET BY MOUTH BEFORE CHEMO 30 tablet 0  . OVER THE COUNTER MEDICATION Take 1 tablet by mouth every morning. (Vitamin A)    . prochlorperazine (COMPAZINE) 10 MG tablet Take 1 tablet (10 mg total) by mouth every 6 (six) hours as needed for nausea or vomiting. 60 tablet 0  . rosuvastatin (CRESTOR) 10 MG tablet Take 1 tablet (10 mg total) by mouth daily. 30 tablet 12  . senna-docusate (SENOKOT-S) 8.6-50 MG tablet Take 1 tablet by mouth daily. 30 tablet prn  .  valACYclovir (VALTREX) 1000 MG tablet Take 1,000 mg by mouth 3 (three) times daily.    . Vitamin D, Ergocalciferol, (DRISDOL) 1.25 MG (50000 UNIT) CAPS capsule Take 50,000 Units by mouth once a week.     No current facility-administered medications for this visit.   Facility-Administered Medications Ordered in Other Visits  Medication Dose Route Frequency Provider Last Rate Last Admin  . sodium chloride flush (NS) 0.9 % injection 10 mL  10 mL Intravenous PRN Curt Bears, MD      . sodium chloride flush (NS) 0.9 % injection 10 mL  10 mL Intravenous PRN Curt Bears, MD         SURGICAL HISTORY:  Past Surgical History:  Procedure Laterality Date  . ABDOMINAL HYSTERECTOMY    . COLOSTOMY TAKEDOWN N/A 07/10/2014   Procedure: LAPAROSCOPIC LYSIS OF ADHESIONS (90 MIN) LAPAROSCOPIC ASSISTED COLOSTOMY CLOSURE, RIGID PROCTOSIGMOIDOSCOPY;  Surgeon: Jackolyn Confer, MD;  Location: [facility];  Service: General;  Laterality: N/A;  . LAPAROTOMY N/A 11/03/2013   Procedure: EXPLORATORY LAPAROTOMY, DRAINAGE OF INTRA  ABDOMINAL ABSCESSES, MOBILIZATION OF SPLENIC FLEXURE, SIGMOID COLECTOMY WITH COLOSTOMY;  Surgeon: Odis Hollingshead, MD;  Location: [facility];  Service: General;  Laterality: N/A;  . VIDEO BRONCHOSCOPY Bilateral 08/30/2013   Procedure: VIDEO BRONCHOSCOPY WITH FLUORO;  Surgeon: Tanda Rockers, MD;  Location: [facility];  Service: Cardiopulmonary;  Laterality: Bilateral;    REVIEW OF SYSTEMS:  A comprehensive review of systems was negative.   PHYSICAL EXAMINATION: General appearance: alert, cooperative and no distress Head: Normocephalic, without obvious abnormality, atraumatic Neck: no adenopathy, no JVD, supple, symmetrical, trachea midline and thyroid not enlarged, symmetric, no tenderness/mass/nodules Lymph nodes: Cervical, supraclavicular, and axillary nodes normal. Resp: clear to auscultation bilaterally Back: symmetric, no curvature. ROM normal. No CVA tenderness. Cardio: regular rate and rhythm, S1, S2 normal, no murmur, click, rub or gallop GI: soft, non-tender; bowel sounds normal; no masses,  no organomegaly Extremities: extremities normal, atraumatic, no cyanosis or edema   ECOG PERFORMANCE STATUS: 1 - Symptomatic but completely ambulatory  Blood pressure (!) 118/44, pulse 100, temperature 98.2 F (36.8 C), temperature source Tympanic, resp. rate 18, height 5' 6"  (1.676 m), weight 189 lb (85.7 kg), SpO2 99 %.  LABORATORY DATA: Lab Results  Component Value Date   WBC 11.3 (H) 04/06/2020   HGB 12.4 04/06/2020   HCT 38.2 04/06/2020   MCV 100.5 (H)  04/06/2020   PLT 283 04/06/2020      Chemistry      Component Value Date/Time   NA 138 03/16/2020 0935   NA 139 08/14/2017 0837   K 4.4 03/16/2020 0935   K 4.4 08/14/2017 0837   CL 106 03/16/2020 0935   CO2 21 (L) 03/16/2020 0935   CO2 24 08/14/2017 0837   BUN 18 03/16/2020 0935   BUN 13.3 08/14/2017 0837   CREATININE 0.89 03/16/2020 0935   CREATININE 0.75 12/02/2019 0753   CREATININE 0.8 08/14/2017 0837      Component Value Date/Time   CALCIUM 9.6 03/16/2020 0935   CALCIUM 9.3 08/14/2017 0837   ALKPHOS 95 03/16/2020 0935   ALKPHOS 107 08/14/2017 0837   AST 12 (L) 03/16/2020 0935   AST 17 12/02/2019 0753   AST 12 08/14/2017 0837   ALT 14 03/16/2020 0935   ALT 16 12/02/2019 0753   ALT 12 08/14/2017 0837   BILITOT 0.4 03/16/2020 0935   BILITOT 0.4 12/02/2019 0753   BILITOT 0.37 08/14/2017 0837       RADIOGRAPHIC STUDIES: CT  Chest W Contrast  Result Date: 03/12/2020 CLINICAL DATA:  Primary Cancer Type: Lung Imaging Indication: Routine surveillance Interval therapy since last imaging? Yes Initial Cancer Diagnosis Date: 08/30/2013; Established by: Biopsy-proven Detailed Pathology: Stage IV non-small cell lung cancer, adenocarcinoma. Primary Tumor location: Right upper lobe. Solitary brain metastasis. Surgeries: Sigmoid colectomy 11/03/2013; colostomy closure 07/10/2014. Hysterectomy. Chemotherapy: Yes; Ongoing? Yes; Most recent administration: 02/24/2020 Immunotherapy? No Radiation therapy? Yes Date Range: 10/28/2013-12/05/2013; Target: Right lung Date Range: 10/16/2013; Target: Right parietal brain lesion EXAM: CT CHEST, ABDOMEN, AND PELVIS WITH CONTRAST TECHNIQUE: Multidetector CT imaging of the chest, abdomen and pelvis was performed following the standard protocol during bolus administration of intravenous contrast. CONTRAST:  120m ISOVUE-300 IOPAMIDOL (ISOVUE-300) INJECTION 61% COMPARISON:  Most recent CT chest, abdomen and pelvis 11/29/2019. 09/09/2013 PET-CT. FINDINGS: CT  CHEST FINDINGS Cardiovascular: Normal heart size. No significant pericardial effusion/thickening. Atherosclerotic nonaneurysmal thoracic aorta. Normal caliber pulmonary arteries. No central pulmonary emboli. Mediastinum/Nodes: No discrete thyroid nodules. Unremarkable esophagus. No pathologically enlarged axillary, mediastinal or hilar lymph nodes. Lungs/Pleura: No pneumothorax. No pleural effusion. Moderate centrilobular and paraseptal emphysema with mild diffuse bronchial wall thickening. Sharply marginated medial apical right upper lobe consolidation with associated volume loss, unchanged, compatible with radiation fibrosis. New patchy peribronchovascular right upper lobe ground-glass opacities (series 6/image 57-59). Posterior right lower lobe 6 mm ground-glass pulmonary nodule (series 6/image 98) is stable. No additional significant pulmonary nodules. Musculoskeletal: No aggressive appearing focal osseous lesions. Mild thoracic spondylosis. CT ABDOMEN PELVIS FINDINGS Hepatobiliary: Normal liver with no liver mass. Normal gallbladder with no radiopaque cholelithiasis. No biliary ductal dilatation. Pancreas: Normal, with no mass or duct dilation. Spleen: Normal size. No mass. Adrenals/Urinary Tract: Normal adrenals. Normal kidneys with no hydronephrosis and no renal mass. Normal bladder. Stomach/Bowel: Normal non-distended stomach. Normal caliber small bowel with no small bowel wall thickening. Normal appendix. Oral contrast transits to the colon. Postsurgical changes from distal colectomy with intact appearing distal colonic anastomosis. No large bowel wall thickening, significant diverticulosis or acute pericolonic fat stranding. Vascular/Lymphatic: Atherosclerotic nonaneurysmal abdominal aorta. Patent portal, splenic, hepatic and renal veins. No pathologically enlarged lymph nodes in the abdomen or pelvis. Reproductive: Status post hysterectomy, with no abnormal findings at the vaginal cuff. No adnexal mass.  Other: No pneumoperitoneum, ascites or focal fluid collection. Stable small fat containing periumbilical hernia. Musculoskeletal: No aggressive appearing focal osseous lesions. Mild lumbar spondylosis. IMPRESSION: 1. Stable apical right upper lobe radiation fibrosis with no definite findings of local tumor recurrence. 2. No findings highly suspicious for recurrent metastatic disease in the chest, abdomen or pelvis. 3. New mild patchy peribronchovascular right upper lobe ground-glass opacities, nonspecific, potentially inflammatory. Suggest attention on follow-up chest CT in 3-6 months. 4. Aortic Atherosclerosis (ICD10-I70.0) and Emphysema (ICD10-J43.9). Electronically Signed   By: JIlona SorrelM.D.   On: 03/12/2020 10:27   CT Abdomen Pelvis W Contrast  Result Date: 03/12/2020 CLINICAL DATA:  Primary Cancer Type: Lung Imaging Indication: Routine surveillance Interval therapy since last imaging? Yes Initial Cancer Diagnosis Date: 08/30/2013; Established by: Biopsy-proven Detailed Pathology: Stage IV non-small cell lung cancer, adenocarcinoma. Primary Tumor location: Right upper lobe. Solitary brain metastasis. Surgeries: Sigmoid colectomy 11/03/2013; colostomy closure 07/10/2014. Hysterectomy. Chemotherapy: Yes; Ongoing? Yes; Most recent administration: 02/24/2020 Immunotherapy? No Radiation therapy? Yes Date Range: 10/28/2013-12/05/2013; Target: Right lung Date Range: 10/16/2013; Target: Right parietal brain lesion EXAM: CT CHEST, ABDOMEN, AND PELVIS WITH CONTRAST TECHNIQUE: Multidetector CT imaging of the chest, abdomen and pelvis was performed following the standard protocol during bolus administration of intravenous contrast.  CONTRAST:  173m ISOVUE-300 IOPAMIDOL (ISOVUE-300) INJECTION 61% COMPARISON:  Most recent CT chest, abdomen and pelvis 11/29/2019. 09/09/2013 PET-CT. FINDINGS: CT CHEST FINDINGS Cardiovascular: Normal heart size. No significant pericardial effusion/thickening. Atherosclerotic  nonaneurysmal thoracic aorta. Normal caliber pulmonary arteries. No central pulmonary emboli. Mediastinum/Nodes: No discrete thyroid nodules. Unremarkable esophagus. No pathologically enlarged axillary, mediastinal or hilar lymph nodes. Lungs/Pleura: No pneumothorax. No pleural effusion. Moderate centrilobular and paraseptal emphysema with mild diffuse bronchial wall thickening. Sharply marginated medial apical right upper lobe consolidation with associated volume loss, unchanged, compatible with radiation fibrosis. New patchy peribronchovascular right upper lobe ground-glass opacities (series 6/image 57-59). Posterior right lower lobe 6 mm ground-glass pulmonary nodule (series 6/image 98) is stable. No additional significant pulmonary nodules. Musculoskeletal: No aggressive appearing focal osseous lesions. Mild thoracic spondylosis. CT ABDOMEN PELVIS FINDINGS Hepatobiliary: Normal liver with no liver mass. Normal gallbladder with no radiopaque cholelithiasis. No biliary ductal dilatation. Pancreas: Normal, with no mass or duct dilation. Spleen: Normal size. No mass. Adrenals/Urinary Tract: Normal adrenals. Normal kidneys with no hydronephrosis and no renal mass. Normal bladder. Stomach/Bowel: Normal non-distended stomach. Normal caliber small bowel with no small bowel wall thickening. Normal appendix. Oral contrast transits to the colon. Postsurgical changes from distal colectomy with intact appearing distal colonic anastomosis. No large bowel wall thickening, significant diverticulosis or acute pericolonic fat stranding. Vascular/Lymphatic: Atherosclerotic nonaneurysmal abdominal aorta. Patent portal, splenic, hepatic and renal veins. No pathologically enlarged lymph nodes in the abdomen or pelvis. Reproductive: Status post hysterectomy, with no abnormal findings at the vaginal cuff. No adnexal mass. Other: No pneumoperitoneum, ascites or focal fluid collection. Stable small fat containing periumbilical hernia.  Musculoskeletal: No aggressive appearing focal osseous lesions. Mild lumbar spondylosis. IMPRESSION: 1. Stable apical right upper lobe radiation fibrosis with no definite findings of local tumor recurrence. 2. No findings highly suspicious for recurrent metastatic disease in the chest, abdomen or pelvis. 3. New mild patchy peribronchovascular right upper lobe ground-glass opacities, nonspecific, potentially inflammatory. Suggest attention on follow-up chest CT in 3-6 months. 4. Aortic Atherosclerosis (ICD10-I70.0) and Emphysema (ICD10-J43.9). Electronically Signed   By: JIlona SorrelM.D.   On: 03/12/2020 10:27    ASSESSMENT AND PLAN:  This is a very pleasant 69years old white female with metastatic non-small cell lung cancer, adenocarcinoma status post induction systemic chemotherapy with carboplatin and Alimta with partial response. The patient is currently on maintenance treatment with single agent Alimta status post 101 cycles. The patient continues to tolerate this treatment well with no concerning adverse effects. I recommended for her to proceed with cycle #102 today as planned. She will come back for follow-up visit in 3 weeks for evaluation before the next cycle of her treatment. The patient was advised to call immediately if she has any concerning symptoms in the interval. The patient voices understanding of current disease status and treatment options and is in agreement with the current care plan. All questions were answered. The patient knows to call the clinic with any problems, questions or concerns. We can certainly see the patient much sooner if necessary.  Disclaimer: This note was dictated with voice recognition software. Similar sounding words can inadvertently be transcribed and may not be corrected upon review.

## 2020-04-17 DIAGNOSIS — N3001 Acute cystitis with hematuria: Secondary | ICD-10-CM | POA: Diagnosis not present

## 2020-04-17 DIAGNOSIS — N390 Urinary tract infection, site not specified: Secondary | ICD-10-CM | POA: Diagnosis not present

## 2020-04-23 NOTE — Progress Notes (Addendum)
[facility] OFFICE PROGRESS NOTE  [name], MD [address] [place] [address]  DIAGNOSIS: Stage IV (T2a, N0, M1b) non-small cell lung cancer, adenocarcinoma with negative EGFR and ALK mutations diagnosed in January 2015 and presented with right upper lobe lung mass in addition to a solitary brain metastasis  PRIOR THERAPY: 1) Status post stereotactic radiotherapy to a solitary right parietal brain lesion under the care of Dr. [name] on 10/16/2013. 2) Status post palliative radiotherapy to the right lung tumor under the care of Dr. [name] completed on 12/05/2013. 3) Systemic chemotherapy with carboplatin for AUC of 5 and Alimta 500 mg/M2 every 3 weeks. First dose Jan 06 2014. Status post 6 cycles.  CURRENT THERAPY: Systemic chemotherapy with maintenance Alimta 500 MG/M2 every 3 weeks, status post102cycles.  INTERVAL HISTORY: Alexis [name] 69 y.o. female returns to the clinic for a follow up visit. The patient is feeling well today without any concerning complaints except she had dysuria last week and was found to have a UTI. She completed her course of antibiotics and is feeling better at this time. Denies back pain or fevers. The patient continues to tolerate treatment withsingle agent Al[place]ta[place] without any adverse effects except flushing from the steroids. Denies any chills, night sweats, or weight loss. Denies any chest pain, cough, or hemoptysis. She reports shortness of breath in the heat. Denies any nausea, vomiting,or diarrhea. She has mild constipation after chemotherapy and she inquired about medications for this.Denies any headache or visual changes.The patient is here today for evaluation prior to starting cycle # 103  MEDICAL HISTORY: Past Medical History:  Diagnosis Date  . Anxiety   . Anxiety 06/20/2016  . Cervical cancer ([place] [facility])   . Encounter for antineoplastic chemotherapy 07/20/2015  . Malignant neoplasm of right upper lobe of lung (HCC)     non  small cell lung cancer adenocarcioma with brain meta    ALLERGIES:  is allergic to codeine.  MEDICATIONS:  Current Outpatient Medications  Medication Sig Dispense Refill  . acetaminophen (TYLENOL) 500 MG tablet Take 500 mg by mouth every 6 (six) hours as needed for mild pain or headache. Reported on 11/02/2015    . ALPRAZolam (XANAX) 1 MG tablet SMARTSIG:1 Tablet(s) By Mouth 1 to 3 Times Daily PRN    . Ascorbic Acid (VITAMIN C GUMMIE PO) Take 1 each by mouth every morning.    Marland Kitchen aspirin EC 81 MG tablet Take 1 tablet (81 mg total) by mouth daily. 150 tablet 2  . [employer] ACID CONTROLLER 10 MG tablet SMARTSIG:1 Tablet(s) By Mouth Every 12 Hours PRN    . dexamethasone (DECADRON) 4 MG tablet TAKE 1 PILL TWICE A DAY THE DAY BEFORE, THE DAY OF, AND THE DAY AFTER CHEMOTHERAPY. 40 tablet 2  . folic acid (FOLVITE) 1 MG tablet TAKE 1 TABLET BY MOUTH EVERY DAY 90 tablet 0  . HYDROcodone-acetaminophen (NORCO/VICODIN) 5-325 MG tablet TAKE 1 TABLET BY MOUTH 3 TIMES A DAY FOR 5 DAYS AS NEEDED FOR PAIN    . Multiple Vitamin (MULTIVITAMIN WITH MINERALS) TABS tablet Take 1 tablet by mouth every morning.    . ondansetron (ZOFRAN) 8 MG tablet TAKE 1 TABLET BY MOUTH BEFORE CHEMO 30 tablet 0  . OVER THE COUNTER MEDICATION Take 1 tablet by mouth every morning. (Vitamin A)    . prochlorperazine (COMPAZINE) 10 MG tablet Take 1 tablet (10 mg total) by mouth every 6 (six) hours as needed for nausea or vomiting. 60 tablet 0  .  rosuvastatin (CRESTOR) 10 MG tablet Take 1 tablet (10 mg total) by mouth daily. 30 tablet 12  . senna-docusate (SENOKOT-S) 8.6-50 MG tablet Take 1 tablet by mouth daily. 30 tablet prn  . valACYclovir (VALTREX) 1000 MG tablet Take 1,000 mg by mouth 3 (three) times daily.    . Vitamin D, Ergocalciferol, (DRISDOL) 1.25 MG (50000 UNIT) CAPS capsule Take 50,000 Units by mouth once a week.     No current facility-administered medications for this visit.   Facility-Administered Medications Ordered in Other  Visits  Medication Dose Route Frequency Provider Last Rate Last Admin  . sodium chloride flush (NS) 0.9 % injection 10 mL  10 mL Intravenous PRN Curt Bears, MD      . sodium chloride flush (NS) 0.9 % injection 10 mL  10 mL Intravenous PRN Curt Bears, MD        SURGICAL HISTORY:  Past Surgical History:  Procedure Laterality Date  . ABDOMINAL HYSTERECTOMY    . COLOSTOMY TAKEDOWN N/A 07/10/2014   Procedure: LAPAROSCOPIC LYSIS OF ADHESIONS (90 MIN) LAPAROSCOPIC ASSISTED COLOSTOMY CLOSURE, RIGID PROCTOSIGMOIDOSCOPY;  Surgeon: Jackolyn Confer, MD;  Location: [facility];  Service: General;  Laterality: N/A;  . LAPAROTOMY N/A 11/03/2013   Procedure: EXPLORATORY LAPAROTOMY, DRAINAGE OF INTRA  ABDOMINAL ABSCESSES, MOBILIZATION OF SPLENIC FLEXURE, SIGMOID COLECTOMY WITH COLOSTOMY;  Surgeon: Odis Hollingshead, MD;  Location: [facility];  Service: General;  Laterality: N/A;  . VIDEO BRONCHOSCOPY Bilateral 08/30/2013   Procedure: VIDEO BRONCHOSCOPY WITH FLUORO;  Surgeon: Tanda Rockers, MD;  Location: [facility];  Service: Cardiopulmonary;  Laterality: Bilateral;    REVIEW OF SYSTEMS:   Review of Systems  Constitutional: Negative for appetite change, chills, fatigue, fever and unexpected weight change.  HENT: Negative for mouth sores, nosebleeds, sore throat and trouble swallowing.   Eyes: Negative for eye problems and icterus.  Respiratory: Positive for shortness of breath in the heat. Negative for cough, hemoptysis, and wheezing.   Cardiovascular: Negative for chest pain and leg swelling.  Gastrointestinal: Positive for constipation following chemotherapy. Negative for abdominal pain,  diarrhea, nausea and vomiting.  Genitourinary: Negative for bladder incontinence, difficulty urinating, dysuria (resolved), frequency and hematuria.   Musculoskeletal: Negative for back pain, gait problem, neck pain and neck stiffness.  Skin: Negative for itching and rash.  Neurological: Negative for dizziness,  extremity weakness, gait problem, headaches, light-headedness and seizures.  Hematological: Negative for adenopathy. Does not bruise/bleed easily.  Psychiatric/Behavioral: Negative for confusion, depression and sleep disturbance. The patient is not nervous/anxious.     PHYSICAL EXAMINATION:  Blood pressure 100/72, pulse (!) 111, temperature (!) 97.2 F (36.2 C), temperature source Tympanic, resp. rate 17, height _0  (1.676 m), weight 190 lb (86.2 kg), SpO2 99 %.  ECOG PERFORMANCE STATUS: 1 - Symptomatic but completely ambulatory  Physical Exam  Constitutional: Oriented to person, place, and time and well-developed, well-nourished, and in no distress.   HENT:  Head: Normocephalic and atraumatic.  Mouth/Throat: Oropharynx is clear and moist. No oropharyngeal exudate.  Eyes: Conjunctivae are normal. Right eye exhibits no discharge. Left eye exhibits no discharge. No scleral icterus.  Neck: Normal range of motion. Neck supple.  Cardiovascular: Normal rate, regular rhythm, normal heart sounds and intact distal pulses.   Pulmonary/Chest: Effort normal and breath sounds normal. No respiratory distress. No wheezes. No rales.  Abdominal: Soft. Bowel sounds are normal. Exhibits no distension and no mass. There is no tenderness.  Musculoskeletal: Normal range of motion. Exhibits no edema.  Lymphadenopathy:    No  cervical adenopathy.  Neurological: Alert and oriented to person, place, and time. Exhibits normal muscle tone. Gait normal. Coordination normal.  Skin: Skin is warm and dry. No rash noted. Not diaphoretic. No erythema. No pallor.  Psychiatric: Mood, memory and judgment normal.  Vitals reviewed.  LABORATORY DATA: Lab Results  Component Value Date   WBC 8.7 04/27/2020   HGB 11.6 (L) 04/27/2020   HCT 36.5 04/27/2020   MCV 99.7 04/27/2020   PLT 370 04/27/2020      Chemistry      Component Value Date/Time   NA 139 04/06/2020 0906   NA 139 08/14/2017 0837   K 4.2 04/06/2020 0906    K 4.4 08/14/2017 0837   CL 105 04/06/2020 0906   CO2 21 (L) 04/06/2020 0906   CO2 24 08/14/2017 0837   BUN 15 04/06/2020 0906   BUN 13.3 08/14/2017 0837   CREATININE 0.87 04/06/2020 0906   CREATININE 0.75 12/02/2019 0753   CREATININE 0.8 08/14/2017 0837      Component Value Date/Time   CALCIUM 10.0 04/06/2020 0906   CALCIUM 9.3 08/14/2017 0837   ALKPHOS 100 04/06/2020 0906   ALKPHOS 107 08/14/2017 0837   AST 11 (L) 04/06/2020 0906   AST 17 12/02/2019 0753   AST 12 08/14/2017 0837   ALT 16 04/06/2020 0906   ALT 16 12/02/2019 0753   ALT 12 08/14/2017 0837   BILITOT 0.5 04/06/2020 0906   BILITOT 0.4 12/02/2019 0753   BILITOT 0.37 08/14/2017 0837       RADIOGRAPHIC STUDIES:  No results found.   ASSESSMENT/PLAN:  This isa very pleasant 69 year old Caucasian female with stage IV non-small cell lung cancer, adenocarcinoma who presented with a right upper lobe lungmass in addition to a solitary brain metastatsis. She was diagnosed in January 2015. The patient had completed induction systemic chemotherapy with carboplatin and Alimta with a partial response. The patient is currently being treated with single agentmaintenanceAlimta. She is status post102cycles.    Labs were reviewed. Recommend that she proceed with cycle #103 today as scheduled.   She will continue to take her decadron the day before, the day of, and the day after chemotherapy.  We will see her back for a follow up visit in 3 weeks for evaluation before starting cycle #104.  Advised the patient to use stool softeners as well as laxatives if needed for her constipation. She was also encouraged to stay hydrated as well as eat fruits and vegetables and be active to help her constipation.   The patient was advised to call immediately if she has any concerning symptoms in the interval. The patient voices understanding of current disease status and treatment options and is in agreement with the current care  plan. All questions were answered. The patient knows to call the clinic with any problems, questions or concerns. We can certainly see the patient much sooner if necessary       No orders of the defined types were placed in this encounter.    Phillipe Clemon L Jaleeah Slight, PA-C 04/27/20

## 2020-04-27 ENCOUNTER — Inpatient Hospital Stay: Payer: Medicare Other

## 2020-04-27 ENCOUNTER — Other Ambulatory Visit: Payer: Self-pay

## 2020-04-27 ENCOUNTER — Inpatient Hospital Stay (HOSPITAL_BASED_OUTPATIENT_CLINIC_OR_DEPARTMENT_OTHER): Payer: Medicare Other | Admitting: Physician Assistant

## 2020-04-27 VITALS — BP 100/72 | HR 111 | Temp 97.2°F | Resp 17 | Ht 66.0 in | Wt 190.0 lb

## 2020-04-27 VITALS — HR 98

## 2020-04-27 DIAGNOSIS — C3411 Malignant neoplasm of upper lobe, right bronchus or lung: Secondary | ICD-10-CM

## 2020-04-27 DIAGNOSIS — Z9221 Personal history of antineoplastic chemotherapy: Secondary | ICD-10-CM | POA: Diagnosis not present

## 2020-04-27 DIAGNOSIS — Z5111 Encounter for antineoplastic chemotherapy: Secondary | ICD-10-CM | POA: Diagnosis not present

## 2020-04-27 DIAGNOSIS — Z5112 Encounter for antineoplastic immunotherapy: Secondary | ICD-10-CM | POA: Diagnosis not present

## 2020-04-27 DIAGNOSIS — Z79899 Other long term (current) drug therapy: Secondary | ICD-10-CM | POA: Diagnosis not present

## 2020-04-27 DIAGNOSIS — C7931 Secondary malignant neoplasm of brain: Secondary | ICD-10-CM | POA: Diagnosis not present

## 2020-04-27 DIAGNOSIS — Z923 Personal history of irradiation: Secondary | ICD-10-CM | POA: Diagnosis not present

## 2020-04-27 LAB — CBC WITH DIFFERENTIAL/PLATELET
Abs Immature Granulocytes: 0.06 10*3/uL (ref 0.00–0.07)
Basophils Absolute: 0 10*3/uL (ref 0.0–0.1)
Basophils Relative: 0 %
Eosinophils Absolute: 0 10*3/uL (ref 0.0–0.5)
Eosinophils Relative: 0 %
HCT: 36.5 % (ref 36.0–46.0)
Hemoglobin: 11.6 g/dL — ABNORMAL LOW (ref 12.0–15.0)
Immature Granulocytes: 1 %
Lymphocytes Relative: 6 %
Lymphs Abs: 0.6 10*3/uL — ABNORMAL LOW (ref 0.7–4.0)
MCH: 31.7 pg (ref 26.0–34.0)
MCHC: 31.8 g/dL (ref 30.0–36.0)
MCV: 99.7 fL (ref 80.0–100.0)
Monocytes Absolute: 0.8 10*3/uL (ref 0.1–1.0)
Monocytes Relative: 9 %
Neutro Abs: 7.3 10*3/uL (ref 1.7–7.7)
Neutrophils Relative %: 84 %
Platelets: 370 10*3/uL (ref 150–400)
RBC: 3.66 MIL/uL — ABNORMAL LOW (ref 3.87–5.11)
RDW: 15.5 % (ref 11.5–15.5)
WBC: 8.7 10*3/uL (ref 4.0–10.5)
nRBC: 0 % (ref 0.0–0.2)

## 2020-04-27 LAB — COMPREHENSIVE METABOLIC PANEL
ALT: 14 U/L (ref 0–44)
AST: 10 U/L — ABNORMAL LOW (ref 15–41)
Albumin: 3.2 g/dL — ABNORMAL LOW (ref 3.5–5.0)
Alkaline Phosphatase: 91 U/L (ref 38–126)
Anion gap: 10 (ref 5–15)
BUN: 11 mg/dL (ref 8–23)
CO2: 21 mmol/L — ABNORMAL LOW (ref 22–32)
Calcium: 10.2 mg/dL (ref 8.9–10.3)
Chloride: 105 mmol/L (ref 98–111)
Creatinine, Ser: 0.83 mg/dL (ref 0.44–1.00)
GFR calc Af Amer: >60 mL/min (ref >60)
GFR calc non Af Amer: >60 mL/min (ref >60)
Glucose, Bld: 265 mg/dL — ABNORMAL HIGH (ref 70–99)
Potassium: 3.9 mmol/L (ref 3.5–5.1)
Sodium: 136 mmol/L (ref 135–145)
Total Bilirubin: 0.4 mg/dL (ref 0.3–1.2)
Total Protein: 6.6 g/dL (ref 6.5–8.1)

## 2020-04-27 MED ORDER — SODIUM CHLORIDE 0.9 % IV SOLN
10.0000 mg | Freq: Once | INTRAVENOUS | Status: AC
  Administered 2020-04-27: 10 mg via INTRAVENOUS
  Filled 2020-04-27: qty 10

## 2020-04-27 MED ORDER — SODIUM CHLORIDE 0.9 % IV SOLN
Freq: Once | INTRAVENOUS | Status: AC
  Filled 2020-04-27: qty 250

## 2020-04-27 MED ORDER — SODIUM CHLORIDE 0.9 % IV SOLN
500.0000 mg/m2 | Freq: Once | INTRAVENOUS | Status: AC
  Administered 2020-04-27: 900 mg via INTRAVENOUS
  Filled 2020-04-27: qty 20

## 2020-04-27 NOTE — Patient Instructions (Signed)
[facility] Discharge Instructions for Patients Receiving Chemotherapy  Today you received the following chemotherapy agents: alimta   To help prevent nausea and vomiting after your treatment, we encourage you to take your nausea medication as directed.    If you develop nausea and vomiting that is not controlled by your nausea medication, call the clinic.   BELOW ARE SYMPTOMS THAT SHOULD BE REPORTED IMMEDIATELY:  *FEVER GREATER THAN 100.5 F  *CHILLS WITH OR WITHOUT FEVER  NAUSEA AND VOMITING THAT IS NOT CONTROLLED WITH YOUR NAUSEA MEDICATION  *UNUSUAL SHORTNESS OF BREATH  *UNUSUAL BRUISING OR BLEEDING  TENDERNESS IN MOUTH AND THROAT WITH OR WITHOUT PRESENCE OF ULCERS  *URINARY PROBLEMS  *BOWEL PROBLEMS  UNUSUAL RASH Items with * indicate a potential emergency and should be followed up as soon as possible.  Feel free to call the clinic should you have any questions or concerns. The clinic phone number is [phone].  Please show the [facility] at check-in to the Emergency Department and triage nurse.

## 2020-05-18 ENCOUNTER — Inpatient Hospital Stay: Payer: Medicare Other

## 2020-05-18 ENCOUNTER — Inpatient Hospital Stay: Payer: Medicare Other | Attending: Internal Medicine | Admitting: Internal Medicine

## 2020-05-18 ENCOUNTER — Encounter: Payer: Self-pay | Admitting: Internal Medicine

## 2020-05-18 ENCOUNTER — Other Ambulatory Visit: Payer: Self-pay | Admitting: Internal Medicine

## 2020-05-18 ENCOUNTER — Other Ambulatory Visit: Payer: Self-pay

## 2020-05-18 VITALS — BP 150/80 | HR 98 | Temp 97.6°F | Resp 18 | Ht 66.0 in | Wt 191.6 lb

## 2020-05-18 DIAGNOSIS — Z5112 Encounter for antineoplastic immunotherapy: Secondary | ICD-10-CM | POA: Diagnosis not present

## 2020-05-18 DIAGNOSIS — Z5111 Encounter for antineoplastic chemotherapy: Secondary | ICD-10-CM

## 2020-05-18 DIAGNOSIS — C3411 Malignant neoplasm of upper lobe, right bronchus or lung: Secondary | ICD-10-CM | POA: Diagnosis not present

## 2020-05-18 DIAGNOSIS — Z923 Personal history of irradiation: Secondary | ICD-10-CM | POA: Insufficient documentation

## 2020-05-18 DIAGNOSIS — C7931 Secondary malignant neoplasm of brain: Secondary | ICD-10-CM | POA: Insufficient documentation

## 2020-05-18 DIAGNOSIS — Z79899 Other long term (current) drug therapy: Secondary | ICD-10-CM | POA: Diagnosis not present

## 2020-05-18 DIAGNOSIS — C3490 Malignant neoplasm of unspecified part of unspecified bronchus or lung: Secondary | ICD-10-CM

## 2020-05-18 LAB — COMPREHENSIVE METABOLIC PANEL
ALT: 16 U/L (ref 0–44)
AST: 13 U/L — ABNORMAL LOW (ref 15–41)
Albumin: 3.2 g/dL — ABNORMAL LOW (ref 3.5–5.0)
Alkaline Phosphatase: 100 U/L (ref 38–126)
Anion gap: 12 (ref 5–15)
BUN: 11 mg/dL (ref 8–23)
CO2: 20 mmol/L — ABNORMAL LOW (ref 22–32)
Calcium: 9.2 mg/dL (ref 8.9–10.3)
Chloride: 107 mmol/L (ref 98–111)
Creatinine, Ser: 0.76 mg/dL (ref 0.44–1.00)
GFR calc Af Amer: >60 mL/min (ref >60)
GFR calc non Af Amer: >60 mL/min (ref >60)
Glucose, Bld: 267 mg/dL — ABNORMAL HIGH (ref 70–99)
Potassium: 3.9 mmol/L (ref 3.5–5.1)
Sodium: 139 mmol/L (ref 135–145)
Total Bilirubin: 0.5 mg/dL (ref 0.3–1.2)
Total Protein: 6.6 g/dL (ref 6.5–8.1)

## 2020-05-18 LAB — CBC WITH DIFFERENTIAL/PLATELET
Abs Immature Granulocytes: 0.06 10*3/uL (ref 0.00–0.07)
Basophils Absolute: 0 10*3/uL (ref 0.0–0.1)
Basophils Relative: 0 %
Eosinophils Absolute: 0 10*3/uL (ref 0.0–0.5)
Eosinophils Relative: 0 %
HCT: 39.4 % (ref 36.0–46.0)
Hemoglobin: 12.4 g/dL (ref 12.0–15.0)
Immature Granulocytes: 1 %
Lymphocytes Relative: 6 %
Lymphs Abs: 0.5 10*3/uL — ABNORMAL LOW (ref 0.7–4.0)
MCH: 32 pg (ref 26.0–34.0)
MCHC: 31.5 g/dL (ref 30.0–36.0)
MCV: 101.5 fL — ABNORMAL HIGH (ref 80.0–100.0)
Monocytes Absolute: 0.5 10*3/uL (ref 0.1–1.0)
Monocytes Relative: 7 %
Neutro Abs: 6.7 10*3/uL (ref 1.7–7.7)
Neutrophils Relative %: 86 %
Platelets: 298 10*3/uL (ref 150–400)
RBC: 3.88 MIL/uL (ref 3.87–5.11)
RDW: 14.7 % (ref 11.5–15.5)
WBC: 7.8 10*3/uL (ref 4.0–10.5)
nRBC: 0 % (ref 0.0–0.2)

## 2020-05-18 MED ORDER — CYANOCOBALAMIN 1000 MCG/ML IJ SOLN
1000.0000 ug | Freq: Once | INTRAMUSCULAR | Status: AC
  Administered 2020-05-18: 1000 ug via INTRAMUSCULAR

## 2020-05-18 MED ORDER — SODIUM CHLORIDE 0.9 % IV SOLN
500.0000 mg/m2 | Freq: Once | INTRAVENOUS | Status: AC
  Administered 2020-05-18: 900 mg via INTRAVENOUS
  Filled 2020-05-18: qty 16

## 2020-05-18 MED ORDER — SODIUM CHLORIDE 0.9 % IV SOLN
10.0000 mg | Freq: Once | INTRAVENOUS | Status: AC
  Administered 2020-05-18: 10 mg via INTRAVENOUS
  Filled 2020-05-18: qty 10

## 2020-05-18 MED ORDER — CYANOCOBALAMIN 1000 MCG/ML IJ SOLN
INTRAMUSCULAR | Status: AC
  Filled 2020-05-18: qty 1

## 2020-05-18 MED ORDER — SODIUM CHLORIDE 0.9 % IV SOLN
Freq: Once | INTRAVENOUS | Status: AC
  Filled 2020-05-18: qty 250

## 2020-05-18 NOTE — Patient Instructions (Signed)
[facility] Discharge Instructions for Patients Receiving Chemotherapy  Today you received the following chemotherapy agents: pemetrexed (alimta).  To help prevent nausea and vomiting after your treatment, we encourage you to take your nausea medication as directed.   If you develop nausea and vomiting that is not controlled by your nausea medication, call the clinic.   BELOW ARE SYMPTOMS THAT SHOULD BE REPORTED IMMEDIATELY:  *FEVER GREATER THAN 100.5 F  *CHILLS WITH OR WITHOUT FEVER  NAUSEA AND VOMITING THAT IS NOT CONTROLLED WITH YOUR NAUSEA MEDICATION  *UNUSUAL SHORTNESS OF BREATH  *UNUSUAL BRUISING OR BLEEDING  TENDERNESS IN MOUTH AND THROAT WITH OR WITHOUT PRESENCE OF ULCERS  *URINARY PROBLEMS  *BOWEL PROBLEMS  UNUSUAL RASH Items with * indicate a potential emergency and should be followed up as soon as possible.  Feel free to call the clinic should you have any questions or concerns. The clinic phone number is [phone].  Please show the [facility] at check-in to the Emergency Department and triage nurse.

## 2020-05-18 NOTE — Progress Notes (Signed)
[facility] [phone]   [phone]  OFFICE PROGRESS NOTE  [name], MD [address] [place] [address]  DIAGNOSIS: Stage IV (T2a, N0, M1b) non-small cell lung cancer, adenocarcinoma with negative EGFR and ALK mutations diagnosed in January 2015 and presented with right upper lobe lung mass in addition to a solitary brain metastasis.  PRIOR THERAPY: 1) Status post stereotactic radiotherapy to a solitary right parietal brain lesion under the care of Dr. [name] on 10/16/2013. 2) Status post palliative radiotherapy to the right lung tumor under the care of Dr. [name] completed on 12/05/2013. 3) Systemic chemotherapy with carboplatin for AUC of 5 and Alimta 500 mg/M2 every 3 weeks. First dose Jan 06 2014. Status post 6 cycles.  CURRENT THERAPY: Systemic chemotherapy with maintenance Alimta 500 MG/M2 every 3 weeks, status post 103 cycles.  INTERVAL HISTORY: Alexis [name] 69 y.o. female returns to the clinic today for follow-up visit.  The patient is feeling fine today with no concerning complaints.  She denied having any current chest pain, shortness of breath, cough or hemoptysis.  She denied having any fever or chills.  She has no nausea, vomiting, diarrhea or constipation.  She has no headache or visual changes.  She has no recent weight loss or night sweats.  The patient is here today for evaluation before starting cycle #104.  MEDICAL HISTORY: Past Medical History:  Diagnosis Date  . Anxiety   . Anxiety 06/20/2016  . Cervical cancer ([place])   . Encounter for antineoplastic chemotherapy 07/20/2015  . 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  MEDICATIONS:  Current Outpatient Medications  Medication Sig Dispense Refill  . acetaminophen (TYLENOL) 500 MG tablet Take 500 mg by mouth every 6 (six) hours as needed for mild pain or  headache. Reported on 11/02/2015    . ALPRAZolam (XANAX) 1 MG tablet SMARTSIG:1 Tablet(s) By Mouth 1 to 3 Times Daily PRN    . Ascorbic Acid (VITAMIN C GUMMIE PO) Take 1 each by mouth every morning.    Marland Kitchen aspirin EC 81 MG tablet Take 1 tablet (81 mg total) by mouth daily. 150 tablet 2  . [employer] ACID CONTROLLER 10 MG tablet SMARTSIG:1 Tablet(s) By Mouth Every 12 Hours PRN    . dexamethasone (DECADRON) 4 MG tablet TAKE 1 PILL TWICE A DAY THE DAY BEFORE, THE DAY OF, AND THE DAY AFTER CHEMOTHERAPY. 40 tablet 2  . folic acid (FOLVITE) 1 MG tablet TAKE 1 TABLET BY MOUTH EVERY DAY 90 tablet 0  . HYDROcodone-acetaminophen (NORCO/VICODIN) 5-325 MG tablet TAKE 1 TABLET BY MOUTH 3 TIMES A DAY FOR 5 DAYS AS NEEDED FOR PAIN    . Multiple Vitamin (MULTIVITAMIN WITH MINERALS) TABS tablet Take 1 tablet by mouth every morning.    . ondansetron (ZOFRAN) 8 MG tablet TAKE 1 TABLET BY MOUTH BEFORE CHEMO 30 tablet 0  . OVER THE COUNTER MEDICATION Take 1 tablet by mouth every morning. (Vitamin A)    . prochlorperazine (COMPAZINE) 10 MG tablet Take 1 tablet (10 mg total) by mouth every 6 (six) hours as needed for nausea or vomiting. 60 tablet 0  . rosuvastatin (CRESTOR) 10 MG tablet Take 1 tablet (10 mg total) by mouth daily. 30 tablet 12  . senna-docusate (SENOKOT-S) 8.6-50 MG tablet Take 1 tablet by mouth daily. 30 tablet  prn  . valACYclovir (VALTREX) 1000 MG tablet Take 1,000 mg by mouth 3 (three) times daily.    . Vitamin D, Ergocalciferol, (DRISDOL) 1.25 MG (50000 UNIT) CAPS capsule Take 50,000 Units by mouth once a week.     No current facility-administered medications for this visit.   Facility-Administered Medications Ordered in Other Visits  Medication Dose Route Frequency Provider Last Rate Last Admin  . sodium chloride flush (NS) 0.9 % injection 10 mL  10 mL Intravenous PRN Curt Bears, MD      . sodium chloride flush (NS) 0.9 % injection 10 mL  10 mL Intravenous PRN Curt Bears, MD        SURGICAL  HISTORY:  Past Surgical History:  Procedure Laterality Date  . ABDOMINAL HYSTERECTOMY    . COLOSTOMY TAKEDOWN N/A 07/10/2014   Procedure: LAPAROSCOPIC LYSIS OF ADHESIONS (90 MIN) LAPAROSCOPIC ASSISTED COLOSTOMY CLOSURE, RIGID PROCTOSIGMOIDOSCOPY;  Surgeon: Jackolyn Confer, MD;  Location: [facility];  Service: General;  Laterality: N/A;  . LAPAROTOMY N/A 11/03/2013   Procedure: EXPLORATORY LAPAROTOMY, DRAINAGE OF INTRA  ABDOMINAL ABSCESSES, MOBILIZATION OF SPLENIC FLEXURE, SIGMOID COLECTOMY WITH COLOSTOMY;  Surgeon: Odis Hollingshead, MD;  Location: [facility];  Service: General;  Laterality: N/A;  . VIDEO BRONCHOSCOPY Bilateral 08/30/2013   Procedure: VIDEO BRONCHOSCOPY WITH FLUORO;  Surgeon: Tanda Rockers, MD;  Location: [facility];  Service: Cardiopulmonary;  Laterality: Bilateral;    REVIEW OF SYSTEMS:  A comprehensive review of systems was negative.   PHYSICAL EXAMINATION: General appearance: alert, cooperative and no distress Head: Normocephalic, without obvious abnormality, atraumatic Neck: no adenopathy, no JVD, supple, symmetrical, trachea midline and thyroid not enlarged, symmetric, no tenderness/mass/nodules Lymph nodes: Cervical, supraclavicular, and axillary nodes normal. Resp: clear to auscultation bilaterally Back: symmetric, no curvature. ROM normal. No CVA tenderness. Cardio: regular rate and rhythm, S1, S2 normal, no murmur, click, rub or gallop GI: soft, non-tender; bowel sounds normal; no masses,  no organomegaly Extremities: extremities normal, atraumatic, no cyanosis or edema   ECOG PERFORMANCE STATUS: 1 - Symptomatic but completely ambulatory  Blood pressure (!) 150/80, pulse 98, temperature 97.6 F (36.4 C), temperature source Tympanic, resp. rate 18, height 5' 6"  (1.676 m), weight 191 lb 9.6 oz (86.9 kg), SpO2 99 %.  LABORATORY DATA: Lab Results  Component Value Date   WBC 7.8 05/18/2020   HGB 12.4 05/18/2020   HCT 39.4 05/18/2020   MCV 101.5 (H) 05/18/2020   PLT  298 05/18/2020      Chemistry      Component Value Date/Time   NA 136 04/27/2020 0757   NA 139 08/14/2017 0837   K 3.9 04/27/2020 0757   K 4.4 08/14/2017 0837   CL 105 04/27/2020 0757   CO2 21 (L) 04/27/2020 0757   CO2 24 08/14/2017 0837   BUN 11 04/27/2020 0757   BUN 13.3 08/14/2017 0837   CREATININE 0.83 04/27/2020 0757   CREATININE 0.75 12/02/2019 0753   CREATININE 0.8 08/14/2017 0837      Component Value Date/Time   CALCIUM 10.2 04/27/2020 0757   CALCIUM 9.3 08/14/2017 0837   ALKPHOS 91 04/27/2020 0757   ALKPHOS 107 08/14/2017 0837   AST 10 (L) 04/27/2020 0757   AST 17 12/02/2019 0753   AST 12 08/14/2017 0837   ALT 14 04/27/2020 0757   ALT 16 12/02/2019 0753   ALT 12 08/14/2017 0837   BILITOT 0.4 04/27/2020 0757   BILITOT 0.4 12/02/2019 0753   BILITOT 0.37 08/14/2017 5643  RADIOGRAPHIC STUDIES: No results found.  ASSESSMENT AND PLAN:  This is a very pleasant 69 years old white female with metastatic non-small cell lung cancer, adenocarcinoma status post induction systemic chemotherapy with carboplatin and Alimta with partial response. The patient is currently on maintenance treatment with single agent Alimta status post 103 cycles. The patient continues to tolerate her treatment well with no concerning adverse effects. I recommended for her to proceed with cycle #104 today as planned. I will see her back for follow-up visit in 3 weeks for evaluation after repeating CT scan of the chest, abdomen pelvis for restaging of her disease. For the hypertension she was advised to monitor her blood pressure closely and to discuss with her primary care physician adjustment of her medication if needed. The patient was advised to call immediately if she has any concerning symptoms in the interval. The patient voices understanding of current disease status and treatment options and is in agreement with the current care plan. All questions were answered. The patient knows to  call the clinic with any problems, questions or concerns. We can certainly see the patient much sooner if necessary.  Disclaimer: This note was dictated with voice recognition software. Similar sounding words can inadvertently be transcribed and may not be corrected upon review.

## 2020-05-27 ENCOUNTER — Other Ambulatory Visit: Payer: Self-pay | Admitting: Radiation Therapy

## 2020-05-27 DIAGNOSIS — C7931 Secondary malignant neoplasm of brain: Secondary | ICD-10-CM

## 2020-05-31 IMAGING — MR MR HEAD WO/W CM
11 series · 48 of 48 positions shown · IV contrast (with contrast)
Comparison: 07/12/2019

CLINICAL DATA: Metastatic lung cancer post SRS, follow-up

EXAM:
MRI HEAD WITHOUT AND WITH CONTRAST
TECHNIQUE: Multiplanar, multiecho pulse sequences of the brain and surrounding
structures were obtained without and with intravenous contrast.
CONTRAST:  18mL MULTIHANCE GADOBENATE DIMEGLUMINE 529 MG/ML IV SOLN

[Series 2: FLAIR · sagittal · 3.0mm · 0.75mm/px · 2 of 45 slices shown (1 of 2)]
[im 1/45]
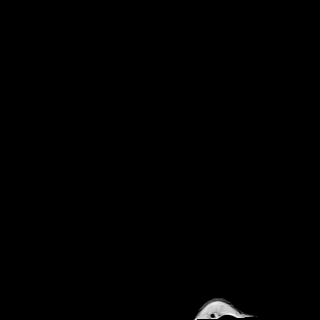
[im 45/45]
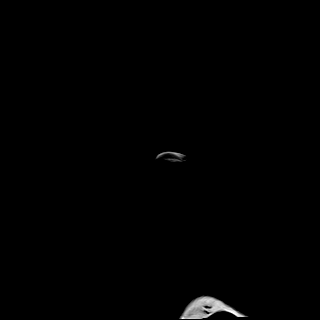

[Series 3: DWI · axial · 3.0mm · 1.50mm/px · z∈[-73,+94]mm · 5 of 88 slices shown (1 of 2)]
[im 1/88]
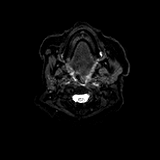
[im 22/88]
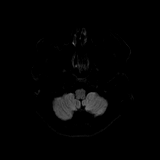
[im 44/88]
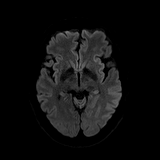
[im 66/88]
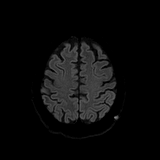
[im 88/88]
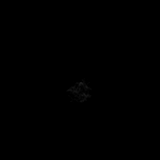

[Series 4: DWI · axial · 3.0mm · 1.50mm/px · z∈[-73,+94]mm · 2 of 44 slices shown (2 of 2)]
[im 1/44]
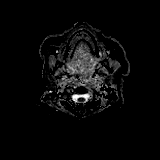
[im 44/44]
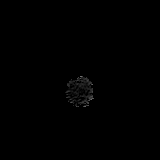

[Series 5: T2 · axial · 5.0mm · 0.57mm/px · z∈[-70,+98]mm · 2 of 29 slices shown]
[im 1/29]
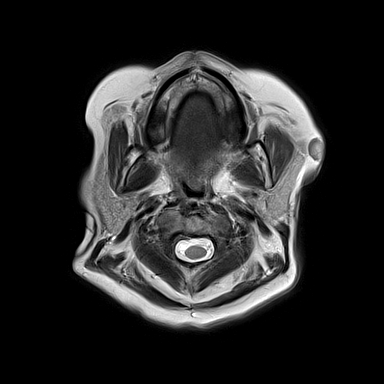
[im 29/29]
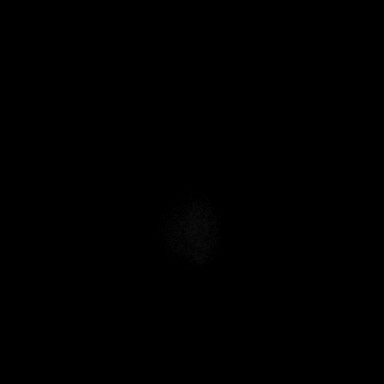

[Series 7: swi_images · axial · 1.5mm · 0.90mm/px · z∈[-69,+98]mm · 6 of 112 slices shown]
[im 1/112]
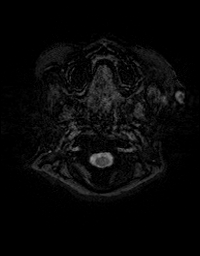
[im 23/112]
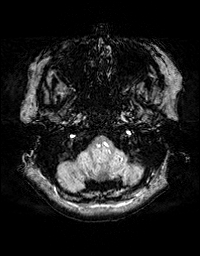
[im 45/112]
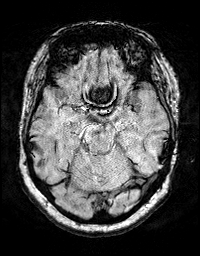
[im 67/112]
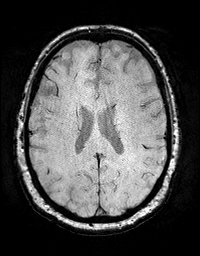
[im 89/112]
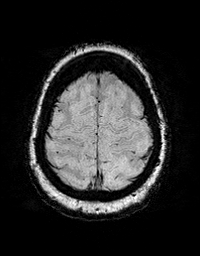
[im 112/112]
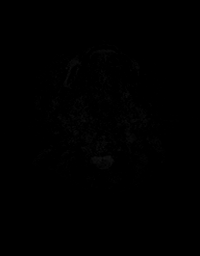

[Series 8: FLAIR · axial · 3.0mm · 0.57mm/px · z∈[-69,+96]mm · 3 of 56 slices shown (2 of 2)]
[im 1/56]
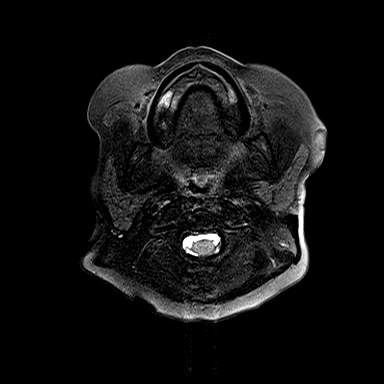
[im 28/56]
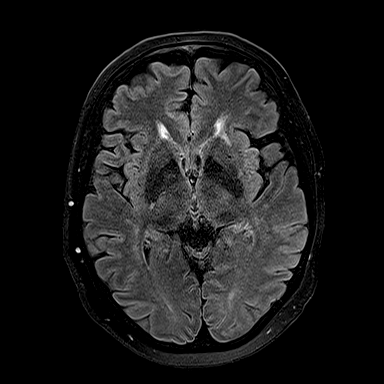
[im 56/56]
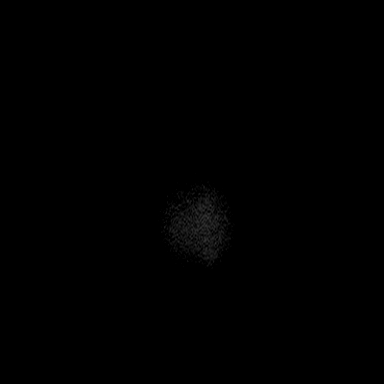

[Series 9: T1 · axial · 1.0mm · 0.75mm/px · z∈[-75,+100]mm · 10 of 175 slices shown]
[im 1/175]
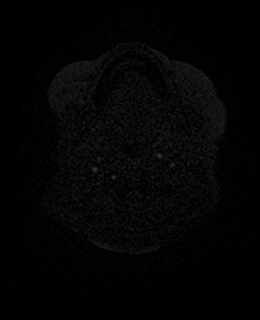
[im 20/175]
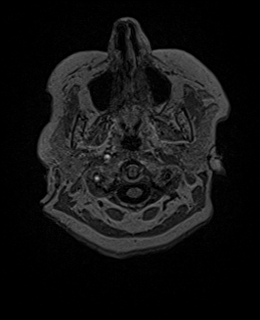
[im 39/175]
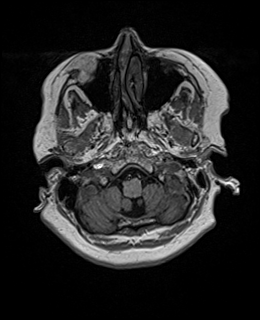
[im 59/175]
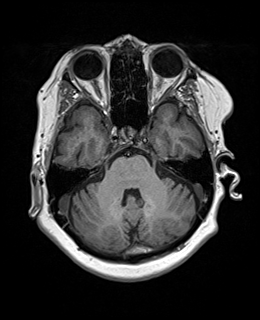
[im 78/175]
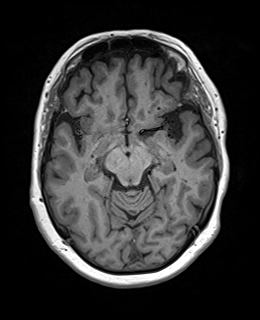
[im 97/175]
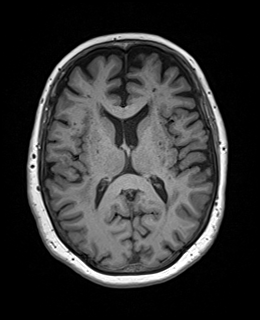
[im 117/175]
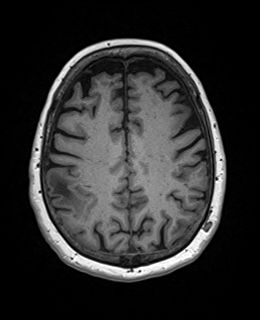
[im 136/175]
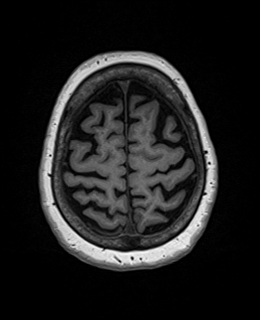
[im 155/175]
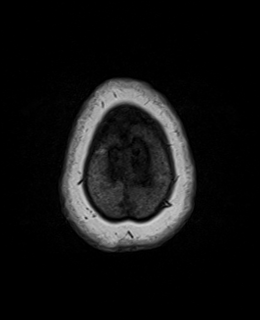
[im 175/175]
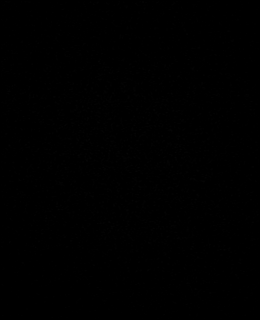

[Series 10: T2 post-contrast · coronal · 3.0mm · 0.57mm/px · 3 of 52 slices shown]
[im 1/52]
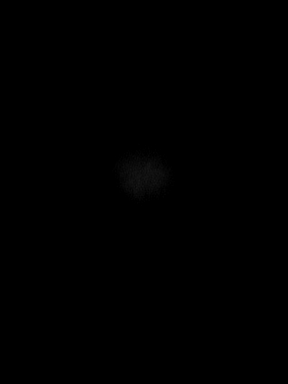
[im 26/52]
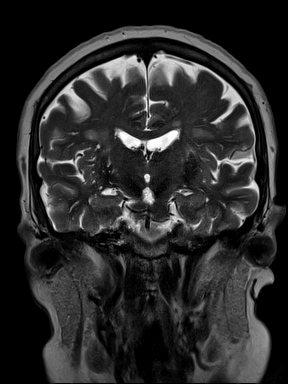
[im 52/52]
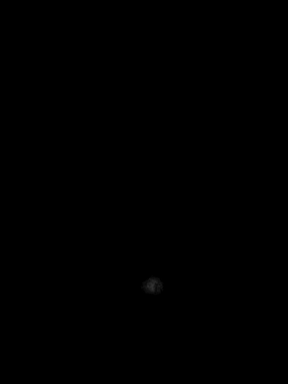

[Series 11: T1 post-contrast · axial · 1.0mm · 0.75mm/px · z∈[-75,+100]mm · 10 of 176 slices shown (1 of 2)]
[im 1/176]
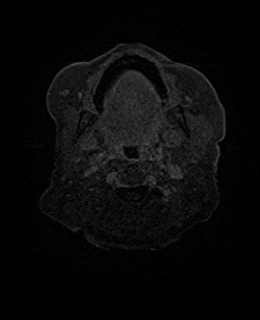
[im 20/176]
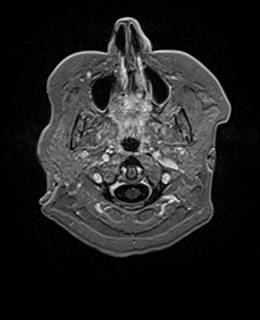
[im 39/176]
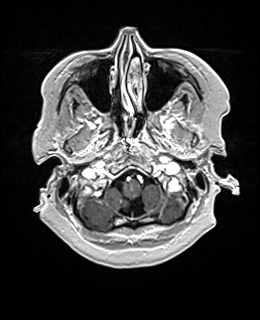
[im 59/176]
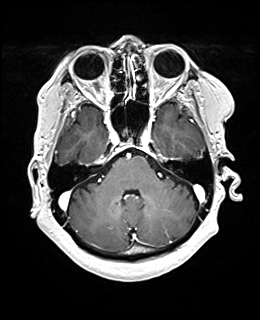
[im 78/176]
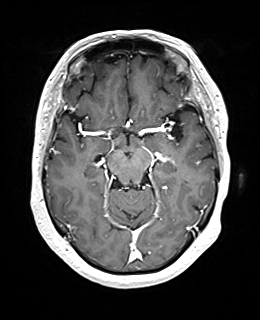
[im 98/176]
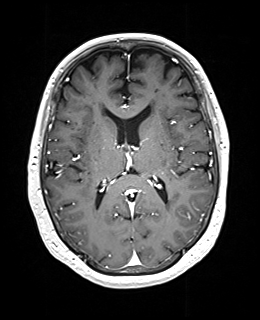
[im 117/176]
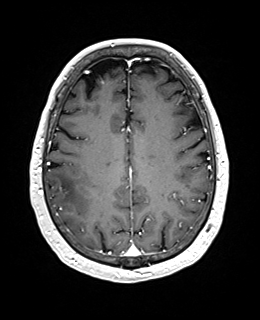
[im 137/176]
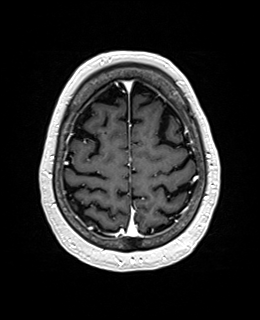
[im 156/176]
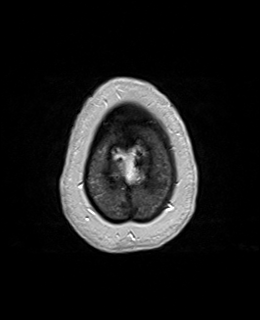
[im 176/176]
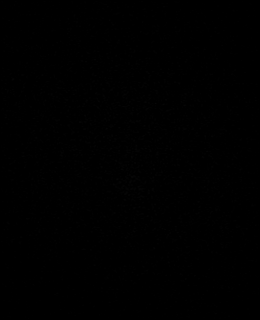

[Series 12: T1 post-contrast · coronal · 3.0mm · 0.57mm/px · 3 of 52 slices shown (2 of 2)]
[im 1/52]
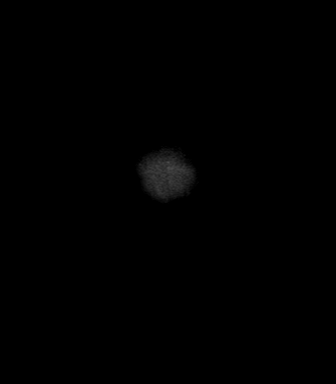
[im 26/52]
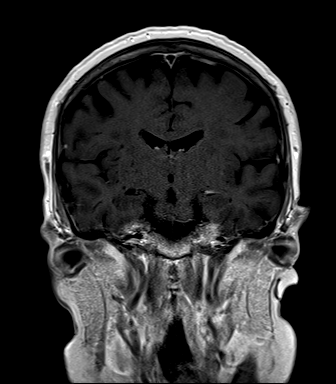
[im 52/52]
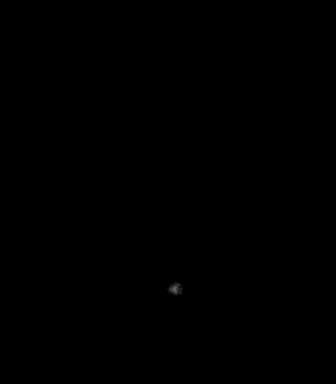

[Series 13: FLAIR post-contrast · sagittal · 3.0mm · 0.75mm/px · 2 of 44 slices shown]
[im 1/44]
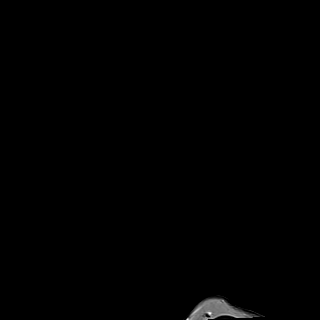
[im 44/44]
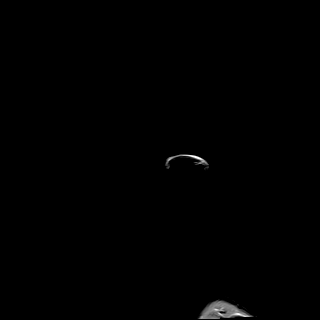

[48 of 48 positions shown; findings below may reference images not displayed]

FINDINGS: Brain: No substantial change in size or appearance of
heterogeneously enhancing right parietal lesion again measuring up
to 1.5 cm. Surrounding T2 FLAIR hyperintensity is also unchanged.
There is no new mass or abnormal enhancement.

Chronic blood products are identified associated with the above
lesion. There is no new hemorrhage or acute infarction. Stable
additional patchy and confluent areas of T2 hyperintensity in the
supratentorial white matter likely reflecting chronic microvascular
ischemic and/or therapy related changes. There is no hydrocephalus.

Vascular: Major vessel flow voids at the skull base are preserved.

Skull and upper cervical spine: Normal marrow signal is preserved.

Sinuses/Orbits: Mild patchy paranasal sinus mucosal thickening.
Bilateral lens replacements.

Other: Sella is unremarkable.  Minimal mastoid fluid opacification.
IMPRESSION: Continued stability of treated right parietal lesion. No new mass or
abnormal enhancement.

## 2020-06-02 DIAGNOSIS — Z23 Encounter for immunization: Secondary | ICD-10-CM | POA: Diagnosis not present

## 2020-06-04 ENCOUNTER — Ambulatory Visit
Admission: RE | Admit: 2020-06-04 | Discharge: 2020-06-04 | Disposition: A | Discharge status: Home / Self Care | Payer: Medicare Other | Source: Ambulatory Visit | Attending: Internal Medicine | Admitting: Internal Medicine

## 2020-06-04 ENCOUNTER — Other Ambulatory Visit: Payer: Self-pay | Admitting: Radiation Therapy

## 2020-06-04 ENCOUNTER — Other Ambulatory Visit: Payer: Self-pay

## 2020-06-04 DIAGNOSIS — I700 Atherosclerosis of aorta: Secondary | ICD-10-CM | POA: Diagnosis not present

## 2020-06-04 DIAGNOSIS — J432 Centrilobular emphysema: Secondary | ICD-10-CM | POA: Diagnosis not present

## 2020-06-04 DIAGNOSIS — Z90710 Acquired absence of both cervix and uterus: Secondary | ICD-10-CM | POA: Diagnosis not present

## 2020-06-04 DIAGNOSIS — J181 Lobar pneumonia, unspecified organism: Secondary | ICD-10-CM | POA: Diagnosis not present

## 2020-06-04 DIAGNOSIS — C3490 Malignant neoplasm of unspecified part of unspecified bronchus or lung: Secondary | ICD-10-CM

## 2020-06-04 MED ORDER — IOPAMIDOL (ISOVUE-300) INJECTION 61%
100.0000 mL | Freq: Once | INTRAVENOUS | Status: AC | PRN
  Administered 2020-06-04: 100 mL via INTRAVENOUS

## 2020-06-05 ENCOUNTER — Other Ambulatory Visit: Payer: Self-pay | Admitting: Internal Medicine

## 2020-06-05 DIAGNOSIS — C3490 Malignant neoplasm of unspecified part of unspecified bronchus or lung: Secondary | ICD-10-CM

## 2020-06-08 ENCOUNTER — Inpatient Hospital Stay: Payer: Medicare Other

## 2020-06-08 ENCOUNTER — Other Ambulatory Visit: Payer: Self-pay

## 2020-06-08 ENCOUNTER — Inpatient Hospital Stay: Payer: Medicare Other | Attending: Internal Medicine

## 2020-06-08 ENCOUNTER — Inpatient Hospital Stay (HOSPITAL_BASED_OUTPATIENT_CLINIC_OR_DEPARTMENT_OTHER): Payer: Medicare Other | Admitting: Internal Medicine

## 2020-06-08 ENCOUNTER — Encounter: Payer: Self-pay | Admitting: Internal Medicine

## 2020-06-08 VITALS — BP 128/60 | HR 110 | Temp 97.6°F | Resp 17 | Ht 66.0 in | Wt 191.6 lb

## 2020-06-08 DIAGNOSIS — Z5111 Encounter for antineoplastic chemotherapy: Secondary | ICD-10-CM

## 2020-06-08 DIAGNOSIS — Z5112 Encounter for antineoplastic immunotherapy: Secondary | ICD-10-CM | POA: Insufficient documentation

## 2020-06-08 DIAGNOSIS — C3411 Malignant neoplasm of upper lobe, right bronchus or lung: Secondary | ICD-10-CM

## 2020-06-08 DIAGNOSIS — Z79899 Other long term (current) drug therapy: Secondary | ICD-10-CM | POA: Insufficient documentation

## 2020-06-08 DIAGNOSIS — Z9221 Personal history of antineoplastic chemotherapy: Secondary | ICD-10-CM | POA: Insufficient documentation

## 2020-06-08 DIAGNOSIS — F419 Anxiety disorder, unspecified: Secondary | ICD-10-CM | POA: Diagnosis not present

## 2020-06-08 DIAGNOSIS — C7931 Secondary malignant neoplasm of brain: Secondary | ICD-10-CM | POA: Insufficient documentation

## 2020-06-08 LAB — CBC WITH DIFFERENTIAL/PLATELET
Abs Immature Granulocytes: 0.07 10*3/uL (ref 0.00–0.07)
Basophils Absolute: 0 10*3/uL (ref 0.0–0.1)
Basophils Relative: 0 %
Eosinophils Absolute: 0 10*3/uL (ref 0.0–0.5)
Eosinophils Relative: 0 %
HCT: 39.4 % (ref 36.0–46.0)
Hemoglobin: 12.7 g/dL (ref 12.0–15.0)
Immature Granulocytes: 1 %
Lymphocytes Relative: 5 %
Lymphs Abs: 0.6 10*3/uL — ABNORMAL LOW (ref 0.7–4.0)
MCH: 32.2 pg (ref 26.0–34.0)
MCHC: 32.2 g/dL (ref 30.0–36.0)
MCV: 99.7 fL (ref 80.0–100.0)
Monocytes Absolute: 0.6 10*3/uL (ref 0.1–1.0)
Monocytes Relative: 6 %
Neutro Abs: 9.2 10*3/uL — ABNORMAL HIGH (ref 1.7–7.7)
Neutrophils Relative %: 88 %
Platelets: 310 10*3/uL (ref 150–400)
RBC: 3.95 MIL/uL (ref 3.87–5.11)
RDW: 14.6 % (ref 11.5–15.5)
WBC: 10.5 10*3/uL (ref 4.0–10.5)
nRBC: 0 % (ref 0.0–0.2)

## 2020-06-08 LAB — COMPREHENSIVE METABOLIC PANEL
ALT: 16 U/L (ref 0–44)
AST: 10 U/L — ABNORMAL LOW (ref 15–41)
Albumin: 3.3 g/dL — ABNORMAL LOW (ref 3.5–5.0)
Alkaline Phosphatase: 103 U/L (ref 38–126)
Anion gap: 11 (ref 5–15)
BUN: 10 mg/dL (ref 8–23)
CO2: 21 mmol/L — ABNORMAL LOW (ref 22–32)
Calcium: 9.8 mg/dL (ref 8.9–10.3)
Chloride: 106 mmol/L (ref 98–111)
Creatinine, Ser: 0.81 mg/dL (ref 0.44–1.00)
GFR, Estimated: >60 mL/min (ref >60)
Glucose, Bld: 291 mg/dL — ABNORMAL HIGH (ref 70–99)
Potassium: 3.9 mmol/L (ref 3.5–5.1)
Sodium: 138 mmol/L (ref 135–145)
Total Bilirubin: 0.5 mg/dL (ref 0.3–1.2)
Total Protein: 6.8 g/dL (ref 6.5–8.1)

## 2020-06-08 MED ORDER — SODIUM CHLORIDE 0.9 % IV SOLN
500.0000 mg/m2 | Freq: Once | INTRAVENOUS | Status: AC
  Administered 2020-06-08: 900 mg via INTRAVENOUS
  Filled 2020-06-08: qty 20

## 2020-06-08 MED ORDER — SODIUM CHLORIDE 0.9 % IV SOLN
10.0000 mg | Freq: Once | INTRAVENOUS | Status: AC
  Administered 2020-06-08: 10 mg via INTRAVENOUS
  Filled 2020-06-08: qty 10

## 2020-06-08 MED ORDER — SODIUM CHLORIDE 0.9 % IV SOLN
Freq: Once | INTRAVENOUS | Status: AC
  Filled 2020-06-08: qty 250

## 2020-06-08 NOTE — Progress Notes (Signed)
[facility] [phone]   [phone]  OFFICE PROGRESS NOTE  [name], MD [address] [place] [address]  DIAGNOSIS: Stage IV (T2a, N0, M1b) non-small cell lung cancer, adenocarcinoma with negative EGFR and ALK mutations diagnosed in January 2015 and presented with right upper lobe lung mass in addition to a solitary brain metastasis.  PRIOR THERAPY: 1) Status post stereotactic radiotherapy to a solitary right parietal brain lesion under the care of Dr. [name] on 10/16/2013. 2) Status post palliative radiotherapy to the right lung tumor under the care of Dr. [name] completed on 12/05/2013. 3) Systemic chemotherapy with carboplatin for AUC of 5 and Alimta 500 mg/M2 every 3 weeks. First dose Jan 06 2014. Status post 6 cycles.  CURRENT THERAPY: Systemic chemotherapy with maintenance Alimta 500 MG/M2 every 3 weeks, status post 104 cycles.  INTERVAL HISTORY: Alexis [name] 69 y.o. female returns to the clinic today for follow-up visit.  The patient is feeling fine today with no concerning complaints.  She denied having any current chest pain, shortness of breath, cough or hemoptysis.  She denied having any fatigue or weakness.  She has no nausea, vomiting, diarrhea or constipation.  She has no headache or visual changes.  She denied having any recent weight loss or night sweats.  She continues to tolerate her treatment with maintenance Alimta fairly well.  The patient had repeat CT scan of the chest, abdomen pelvis performed recently and she is here for evaluation and discussion of her scan results.   MEDICAL HISTORY: Past Medical History:  Diagnosis Date  . Anxiety   . Anxiety 06/20/2016  . Cervical cancer ([facility])   . Encounter for antineoplastic chemotherapy 07/20/2015  . 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  MEDICATIONS:    Current Outpatient Medications  Medication Sig Dispense Refill  . acetaminophen (TYLENOL) 500 MG tablet Take 500 mg by mouth every 6 (six) hours as needed for mild pain or headache. Reported on 11/02/2015    . ALPRAZolam (XANAX) 1 MG tablet SMARTSIG:1 Tablet(s) By Mouth 1 to 3 Times Daily PRN    . Ascorbic Acid (VITAMIN C GUMMIE PO) Take 1 each by mouth every morning.    Marland Kitchen aspirin EC 81 MG tablet Take 1 tablet (81 mg total) by mouth daily. 150 tablet 2  . [employer] ACID CONTROLLER 10 MG tablet SMARTSIG:1 Tablet(s) By Mouth Every 12 Hours PRN    . dexamethasone (DECADRON) 4 MG tablet TAKE 1 PILL TWICE A DAY THE DAY BEFORE, THE DAY OF, AND THE DAY AFTER CHEMOTHERAPY. 40 tablet 2  . folic acid (FOLVITE) 1 MG tablet TAKE 1 TABLET BY MOUTH EVERY DAY 90 tablet 0  . HYDROcodone-acetaminophen (NORCO/VICODIN) 5-325 MG tablet TAKE 1 TABLET BY MOUTH 3 TIMES A DAY FOR 5 DAYS AS NEEDED FOR PAIN    . Multiple Vitamin (MULTIVITAMIN WITH MINERALS) TABS tablet Take 1 tablet by mouth every morning.    . ondansetron (ZOFRAN) 8 MG tablet TAKE 1 TABLET BY MOUTH BEFORE CHEMO 30 tablet 0  . OVER THE COUNTER MEDICATION Take 1 tablet by mouth every morning. (Vitamin A)    . prochlorperazine (COMPAZINE) 10 MG tablet Take 1 tablet (10 mg total) by mouth every 6 (six) hours as needed for nausea or vomiting. 60 tablet 0  . rosuvastatin (CRESTOR) 10  MG tablet Take 1 tablet (10 mg total) by mouth daily. 30 tablet 12  . senna-docusate (SENOKOT-S) 8.6-50 MG tablet Take 1 tablet by mouth daily. 30 tablet prn  . valACYclovir (VALTREX) 1000 MG tablet Take 1,000 mg by mouth 3 (three) times daily.    . Vitamin D, Ergocalciferol, (DRISDOL) 1.25 MG (50000 UNIT) CAPS capsule Take 50,000 Units by mouth once a week.     No current facility-administered medications for this visit.   Facility-Administered Medications Ordered in Other Visits  Medication Dose Route Frequency Provider Last Rate Last Admin  . sodium chloride flush (NS) 0.9 %  injection 10 mL  10 mL Intravenous PRN Emory Gallentine, MD      . sodium chloride flush (NS) 0.9 % injection 10 mL  10 mL Intravenous PRN Jakwon Gayton, MD        SURGICAL HISTORY:  Past Surgical History:  Procedure Laterality Date  . ABDOMINAL HYSTERECTOMY    . COLOSTOMY TAKEDOWN N/A 07/10/2014   Procedure: LAPAROSCOPIC LYSIS OF ADHESIONS (90 MIN) LAPAROSCOPIC ASSISTED COLOSTOMY CLOSURE, RIGID PROCTOSIGMOIDOSCOPY;  Surgeon: Todd Rosenbower, MD;  Location: [facility];  Service: General;  Laterality: N/A;  . LAPAROTOMY N/A 11/03/2013   Procedure: EXPLORATORY LAPAROTOMY, DRAINAGE OF INTRA  ABDOMINAL ABSCESSES, MOBILIZATION OF SPLENIC FLEXURE, SIGMOID COLECTOMY WITH COLOSTOMY;  Surgeon: Todd J Rosenbower, MD;  Location: [facility];  Service: General;  Laterality: N/A;  . VIDEO BRONCHOSCOPY Bilateral 08/30/2013   Procedure: VIDEO BRONCHOSCOPY WITH FLUORO;  Surgeon: Michael B Wert, MD;  Location: [facility];  Service: Cardiopulmonary;  Laterality: Bilateral;    REVIEW OF SYSTEMS:  Constitutional: negative Eyes: negative Ears, nose, mouth, throat, and face: negative Respiratory: negative Cardiovascular: negative Gastrointestinal: negative Genitourinary:negative Integument/breast: negative Hematologic/lymphatic: negative Musculoskeletal:negative Neurological: negative Behavioral/Psych: negative Endocrine: negative Allergic/Immunologic: negative   PHYSICAL EXAMINATION: General appearance: alert, cooperative and no distress Head: Normocephalic, without obvious abnormality, atraumatic Neck: no adenopathy, no JVD, supple, symmetrical, trachea midline and thyroid not enlarged, symmetric, no tenderness/mass/nodules Lymph nodes: Cervical, supraclavicular, and axillary nodes normal. Resp: clear to auscultation bilaterally Back: symmetric, no curvature. ROM normal. No CVA tenderness. Cardio: regular rate and rhythm, S1, S2 normal, no murmur, click, rub or gallop GI: soft, non-tender; bowel sounds  normal; no masses,  no organomegaly Extremities: extremities normal, atraumatic, no cyanosis or edema Neurologic: Alert and oriented X 3, normal strength and tone. Normal symmetric reflexes. Normal coordination and gait   ECOG PERFORMANCE STATUS: 1 - Symptomatic but completely ambulatory  Blood pressure 128/60, pulse (!) 110, temperature 97.6 F (36.4 C), temperature source Tympanic, resp. rate 17, height 5' 6" (1.676 m), weight 191 lb 9.6 oz (86.9 kg), SpO2 98 %.  LABORATORY DATA: Lab Results  Component Value Date   WBC 10.5 06/08/2020   HGB 12.7 06/08/2020   HCT 39.4 06/08/2020   MCV 99.7 06/08/2020   PLT 310 06/08/2020      Chemistry      Component Value Date/Time   NA 138 06/08/2020 0806   NA 139 08/14/2017 0837   K 3.9 06/08/2020 0806   K 4.4 08/14/2017 0837   CL 106 06/08/2020 0806   CO2 21 (L) 06/08/2020 0806   CO2 24 08/14/2017 0837   BUN 10 06/08/2020 0806   BUN 13.3 08/14/2017 0837   CREATININE 0.81 06/08/2020 0806   CREATININE 0.75 12/02/2019 0753   CREATININE 0.8 08/14/2017 0837      Component Value Date/Time   CALCIUM 9.8 06/08/2020 0806   CALCIUM 9.3 08/14/2017 0837   ALKPHOS   103 06/08/2020 0806   ALKPHOS 107 08/14/2017 0837   AST 10 (L) 06/08/2020 0806   AST 17 12/02/2019 0753   AST 12 08/14/2017 0837   ALT 16 06/08/2020 0806   ALT 16 12/02/2019 0753   ALT 12 08/14/2017 0837   BILITOT 0.5 06/08/2020 0806   BILITOT 0.4 12/02/2019 0753   BILITOT 0.37 08/14/2017 0837       RADIOGRAPHIC STUDIES: CT Chest W Contrast  Result Date: 06/04/2020 CLINICAL DATA:  Lung cancer restaging EXAM: CT CHEST, ABDOMEN, AND PELVIS WITH CONTRAST TECHNIQUE: Multidetector CT imaging of the chest, abdomen and pelvis was performed following the standard protocol during bolus administration of intravenous contrast. CONTRAST:  160m ISOVUE-300 IOPAMIDOL (ISOVUE-300) INJECTION 61%, additional oral enteric contrast COMPARISON:  03/12/2020 FINDINGS: CT CHEST FINDINGS  Cardiovascular: Aortic atherosclerosis. Normal heart size. No pericardial effusion. Mediastinum/Nodes: No enlarged mediastinal, hilar, or axillary lymph nodes. Thyroid gland, trachea, and esophagus demonstrate no significant findings. Lungs/Pleura: Moderate centrilobular emphysema. Unchanged post treatment volume loss and consolidation of the right pulmonary apex (series 4, image 25). No pleural effusion or pneumothorax. Musculoskeletal: No chest wall mass or suspicious bone lesions identified. CT ABDOMEN PELVIS FINDINGS Hepatobiliary: No solid liver abnormality is seen. No gallstones, gallbladder wall thickening, or biliary dilatation. Pancreas: Unremarkable. No pancreatic ductal dilatation or surrounding inflammatory changes. Spleen: Normal in size without significant abnormality. Adrenals/Urinary Tract: Adrenal glands are unremarkable. Kidneys are normal, without renal calculi, solid lesion, or hydronephrosis. Bladder is unremarkable. Stomach/Bowel: Stomach is within normal limits. Appendix appears normal. No evidence of bowel wall thickening, distention, or inflammatory changes. Redemonstrated postoperative findings of sigmoid colon resection. Vascular/Lymphatic: Aortic atherosclerosis. No enlarged abdominal or pelvic lymph nodes. Reproductive: Status post hysterectomy. Other: No abdominal wall hernia or abnormality. No abdominopelvic ascites. Musculoskeletal: No acute or significant osseous findings. IMPRESSION: 1. Unchanged post treatment volume loss and consolidation of the right pulmonary apex. 2. Scattered irregular and ground-glass opacities seen in the right upper lobe on prior examination are resolved, consistent with resolution of nonspecific infection or inflammation. 3. No evidence of recurrent or metastatic disease in the chest, abdomen, or pelvis. 4. Emphysema (ICD10-J43.9). 5. Aortic Atherosclerosis (ICD10-I70.0). Electronically Signed   By: AEddie CandleM.D.   On: 06/04/2020 08:41   CT Abdomen  Pelvis W Contrast  Result Date: 06/04/2020 CLINICAL DATA:  Lung cancer restaging EXAM: CT CHEST, ABDOMEN, AND PELVIS WITH CONTRAST TECHNIQUE: Multidetector CT imaging of the chest, abdomen and pelvis was performed following the standard protocol during bolus administration of intravenous contrast. CONTRAST:  107mISOVUE-300 IOPAMIDOL (ISOVUE-300) INJECTION 61%, additional oral enteric contrast COMPARISON:  03/12/2020 FINDINGS: CT CHEST FINDINGS Cardiovascular: Aortic atherosclerosis. Normal heart size. No pericardial effusion. Mediastinum/Nodes: No enlarged mediastinal, hilar, or axillary lymph nodes. Thyroid gland, trachea, and esophagus demonstrate no significant findings. Lungs/Pleura: Moderate centrilobular emphysema. Unchanged post treatment volume loss and consolidation of the right pulmonary apex (series 4, image 25). No pleural effusion or pneumothorax. Musculoskeletal: No chest wall mass or suspicious bone lesions identified. CT ABDOMEN PELVIS FINDINGS Hepatobiliary: No solid liver abnormality is seen. No gallstones, gallbladder wall thickening, or biliary dilatation. Pancreas: Unremarkable. No pancreatic ductal dilatation or surrounding inflammatory changes. Spleen: Normal in size without significant abnormality. Adrenals/Urinary Tract: Adrenal glands are unremarkable. Kidneys are normal, without renal calculi, solid lesion, or hydronephrosis. Bladder is unremarkable. Stomach/Bowel: Stomach is within normal limits. Appendix appears normal. No evidence of bowel wall thickening, distention, or inflammatory changes. Redemonstrated postoperative findings of sigmoid colon resection. Vascular/Lymphatic: Aortic atherosclerosis. No enlarged abdominal  or pelvic lymph nodes. Reproductive: Status post hysterectomy. Other: No abdominal wall hernia or abnormality. No abdominopelvic ascites. Musculoskeletal: No acute or significant osseous findings. IMPRESSION: 1. Unchanged post treatment volume loss and  consolidation of the right pulmonary apex. 2. Scattered irregular and ground-glass opacities seen in the right upper lobe on prior examination are resolved, consistent with resolution of nonspecific infection or inflammation. 3. No evidence of recurrent or metastatic disease in the chest, abdomen, or pelvis. 4. Emphysema (ICD10-J43.9). 5. Aortic Atherosclerosis (ICD10-I70.0). Electronically Signed   By: Alex  Bibbey M.D.   On: 06/04/2020 08:41    ASSESSMENT AND PLAN:  This is a very pleasant 69 years old white female with metastatic non-small cell lung cancer, adenocarcinoma status post induction systemic chemotherapy with carboplatin and Alimta with partial response. The patient is currently on maintenance treatment with single agent Alimta status post 104 cycles. She continues to tolerate this treatment well with no concerning adverse effects. She had repeat CT scan of the chest, abdomen pelvis performed recently.  I personally and independently reviewed the scans and discussed the results with the patient today. Her scan showed no concerning findings for recurrent or metastatic disease in the chest, abdomen pelvis. I recommended for the patient to continue her current treatment with maintenance Alimta and she will proceed with cycle #105 today. For the hyperglycemia, this is likely to be secondary to the steroid premedication but I strongly encouraged the patient to monitor her blood sugar closely and to discuss with her primary care physician for evaluation and A1c lab work. For the anxiety, she will continue her current treatment with Xanax. The patient was advised to call immediately if she has any concerning symptoms in the interval. The patient voices understanding of current disease status and treatment options and is in agreement with the current care plan. All questions were answered. The patient knows to call the clinic with any problems, questions or concerns. We can certainly see the  patient much sooner if necessary.  Disclaimer: This note was dictated with voice recognition software. Similar sounding words can inadvertently be transcribed and may not be corrected upon review.       

## 2020-06-08 NOTE — Patient Instructions (Signed)
[facility] Discharge Instructions for Patients Receiving Chemotherapy  Today you received the following chemotherapy agents: pemetrexed.  To help prevent nausea and vomiting after your treatment, we encourage you to take your nausea medication as directed.   If you develop nausea and vomiting that is not controlled by your nausea medication, call the clinic.   BELOW ARE SYMPTOMS THAT SHOULD BE REPORTED IMMEDIATELY:  *FEVER GREATER THAN 100.5 F  *CHILLS WITH OR WITHOUT FEVER  NAUSEA AND VOMITING THAT IS NOT CONTROLLED WITH YOUR NAUSEA MEDICATION  *UNUSUAL SHORTNESS OF BREATH  *UNUSUAL BRUISING OR BLEEDING  TENDERNESS IN MOUTH AND THROAT WITH OR WITHOUT PRESENCE OF ULCERS  *URINARY PROBLEMS  *BOWEL PROBLEMS  UNUSUAL RASH Items with * indicate a potential emergency and should be followed up as soon as possible.  Feel free to call the clinic should you have any questions or concerns. The clinic phone number is [phone].  Please show the [facility] at check-in to the Emergency Department and triage nurse.

## 2020-06-08 NOTE — Progress Notes (Signed)
Per Dr. Julien Nordmann, it is okay to tx today despite elevated HR and BS level.

## 2020-06-23 DIAGNOSIS — R339 Retention of urine, unspecified: Secondary | ICD-10-CM | POA: Diagnosis not present

## 2020-06-23 DIAGNOSIS — R739 Hyperglycemia, unspecified: Secondary | ICD-10-CM | POA: Diagnosis not present

## 2020-06-23 DIAGNOSIS — F419 Anxiety disorder, unspecified: Secondary | ICD-10-CM | POA: Diagnosis not present

## 2020-06-23 DIAGNOSIS — N390 Urinary tract infection, site not specified: Secondary | ICD-10-CM | POA: Diagnosis not present

## 2020-06-23 DIAGNOSIS — R300 Dysuria: Secondary | ICD-10-CM | POA: Diagnosis not present

## 2020-06-23 DIAGNOSIS — Z0189 Encounter for other specified special examinations: Secondary | ICD-10-CM | POA: Diagnosis not present

## 2020-06-23 DIAGNOSIS — E119 Type 2 diabetes mellitus without complications: Secondary | ICD-10-CM | POA: Diagnosis not present

## 2020-06-23 DIAGNOSIS — R350 Frequency of micturition: Secondary | ICD-10-CM | POA: Diagnosis not present

## 2020-06-23 DIAGNOSIS — R8290 Unspecified abnormal findings in urine: Secondary | ICD-10-CM | POA: Diagnosis not present

## 2020-06-24 NOTE — Progress Notes (Signed)
[facility] OFFICE PROGRESS NOTE  [name], MD [address] [place] [address]  DIAGNOSIS: Stage IV (T2a, N0, M1b) non-small cell lung cancer, adenocarcinoma with negative EGFR and ALK mutations diagnosed in January 2015 and presented with right upper lobe lung mass in addition to a solitary brain metastasis  PRIOR THERAPY: 1) Status post stereotactic radiotherapy to a solitary right parietal brain lesion under the care of Dr. [name] on 10/16/2013. 2) Status post palliative radiotherapy to the right lung tumor under the care of Dr. [name] completed on 12/05/2013. 3) Systemic chemotherapy with carboplatin for AUC of 5 and Alimta 500 mg/M2 every 3 weeks. First dose Jan 06 2014. Status post 6 cycles.  CURRENT THERAPY: Systemic chemotherapy with maintenance Alimta 500 MG/M2 every 3 weeks, status post105cycles.  INTERVAL HISTORY: Alexis [name] 69 y.o. female returns to the clinic for a follow up visit. The patient is feeling well today without any concerning complaints except she had a UTI last week. She started antibiotics on 06/23/20. The burning has improved at this time. She cannot recall which anti-biotic she is on. She denies back pain, chills, or fevers. Additionally, she recently had an appointment with her PCP due to hyperglycemia on routine labs. She has not been diagnosed with diabetes before and is not on any medication for diabetes. She is waiting to receive the results of her lab work/evaluation for diabetes. Of note, she also takes steroids as pre-medications for her chemotherapy. Otherwise, The patient continues to tolerate treatment withsingle agent Alimtawell without any adverse effects except flushing from the steroids. Denies any chills, night sweats, or weight loss. Denies any chest pain, cough, or hemoptysis.Denies significant or worsening shortness of breath.Denies any nausea, vomiting,or diarrhea. She has mild constipation after chemotherapy.Denies any  headache or visual changes. The patient is here today for evaluation prior to starting cycle #106  MEDICAL HISTORY: Past Medical History:  Diagnosis Date  . Anxiety   . Anxiety 06/20/2016  . Cervical cancer ([place])   . Encounter for antineoplastic chemotherapy 07/20/2015  . 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  MEDICATIONS:  Current Outpatient Medications  Medication Sig Dispense Refill  . acetaminophen (TYLENOL) 500 MG tablet Take 500 mg by mouth every 6 (six) hours as needed for mild pain or headache. Reported on 11/02/2015    . ALPRAZolam (XANAX) 1 MG tablet SMARTSIG:1 Tablet(s) By Mouth 1 to 3 Times Daily PRN    . Ascorbic Acid (VITAMIN C GUMMIE PO) Take 1 each by mouth every morning.    Marland Kitchen aspirin EC 81 MG tablet Take 1 tablet (81 mg total) by mouth daily. 150 tablet 2  . [employer] ACID CONTROLLER 10 MG tablet SMARTSIG:1 Tablet(s) By Mouth Every 12 Hours PRN    . dexamethasone (DECADRON) 4 MG tablet TAKE 1 PILL TWICE A DAY THE DAY BEFORE, THE DAY OF, AND THE DAY AFTER CHEMOTHERAPY. 40 tablet 2  . folic acid (FOLVITE) 1 MG tablet TAKE 1 TABLET BY MOUTH EVERY DAY 90 tablet 0  . HYDROcodone-acetaminophen (NORCO/VICODIN) 5-325 MG tablet TAKE 1 TABLET BY MOUTH 3 TIMES A DAY FOR 5 DAYS AS NEEDED FOR PAIN    . Multiple Vitamin (MULTIVITAMIN WITH MINERALS) TABS tablet Take 1 tablet by mouth every morning.    . ondansetron (ZOFRAN) 8 MG tablet TAKE 1 TABLET BY MOUTH BEFORE CHEMO  30 tablet 0  . OVER THE COUNTER MEDICATION Take 1 tablet by mouth every morning. (Vitamin A)    . prochlorperazine (COMPAZINE) 10 MG tablet Take 1 tablet (10 mg total) by mouth every 6 (six) hours as needed for nausea or vomiting. 60 tablet 0  . rosuvastatin (CRESTOR) 10 MG tablet Take 1 tablet (10 mg total) by mouth daily. 30 tablet 12  . senna-docusate (SENOKOT-S) 8.6-50 MG tablet Take 1 tablet by mouth  daily. 30 tablet prn  . valACYclovir (VALTREX) 1000 MG tablet Take 1,000 mg by mouth 3 (three) times daily.    . Vitamin D, Ergocalciferol, (DRISDOL) 1.25 MG (50000 UNIT) CAPS capsule Take 50,000 Units by mouth once a week.     No current facility-administered medications for this visit.   Facility-Administered Medications Ordered in Other Visits  Medication Dose Route Frequency Provider Last Rate Last Admin  . dexamethasone (DECADRON) 10 mg in sodium chloride 0.9 % 50 mL IVPB  10 mg Intravenous Once Curt Bears, MD      . PEMEtrexed (ALIMTA) 900 mg in sodium chloride 0.9 % 100 mL chemo infusion  500 mg/m2 (Treatment Plan Recorded) Intravenous Once Curt Bears, MD      . sodium chloride flush (NS) 0.9 % injection 10 mL  10 mL Intravenous PRN Curt Bears, MD      . sodium chloride flush (NS) 0.9 % injection 10 mL  10 mL Intravenous PRN Curt Bears, MD        SURGICAL HISTORY:  Past Surgical History:  Procedure Laterality Date  . ABDOMINAL HYSTERECTOMY    . COLOSTOMY TAKEDOWN N/A 07/10/2014   Procedure: LAPAROSCOPIC LYSIS OF ADHESIONS (90 MIN) LAPAROSCOPIC ASSISTED COLOSTOMY CLOSURE, RIGID PROCTOSIGMOIDOSCOPY;  Surgeon: Jackolyn Confer, MD;  Location: [facility];  Service: General;  Laterality: N/A;  . LAPAROTOMY N/A 11/03/2013   Procedure: EXPLORATORY LAPAROTOMY, DRAINAGE OF INTRA  ABDOMINAL ABSCESSES, MOBILIZATION OF SPLENIC FLEXURE, SIGMOID COLECTOMY WITH COLOSTOMY;  Surgeon: Odis Hollingshead, MD;  Location: [facility];  Service: General;  Laterality: N/A;  . VIDEO BRONCHOSCOPY Bilateral 08/30/2013   Procedure: VIDEO BRONCHOSCOPY WITH FLUORO;  Surgeon: Tanda Rockers, MD;  Location: [facility];  Service: Cardiopulmonary;  Laterality: Bilateral;    REVIEW OF SYSTEMS:   Review of Systems  Constitutional: Negative for appetite change, chills, fatigue, fever and unexpected weight change.  HENT: Negative for mouth sores, nosebleeds, sore throat and trouble swallowing.   Eyes:  Negative for eye problems and icterus.  Respiratory: Positive for shortness of breath in the heat. Negative for cough, hemoptysis, and wheezing.   Cardiovascular: Negative for chest pain and leg swelling.  Gastrointestinal: Positive for constipation following chemotherapy. Negative for abdominal pain,  diarrhea, nausea and vomiting.  Genitourinary: Negative for bladder incontinence, difficulty urinating, dysuria (resolved), frequency and hematuria.   Musculoskeletal: Negative for back pain, gait problem, neck pain and neck stiffness.  Skin: Negative for itching and rash.  Neurological: Negative for dizziness, extremity weakness, gait problem, headaches, light-headedness and seizures.  Hematological: Negative for adenopathy. Does not bruise/bleed easily.  Psychiatric/Behavioral: Negative for confusion, depression and sleep disturbance. The patient is not nervous/anxious.     PHYSICAL EXAMINATION:  Blood pressure 128/74, pulse (!) 108, temperature (!) 97.2 F (36.2 C), temperature source Tympanic, resp. rate 17, height 5' 6"  (1.676 m), weight 190 lb 14.4 oz (86.6 kg), SpO2 97 %.  ECOG PERFORMANCE STATUS: 1 - Symptomatic but completely ambulatory  Physical Exam  Constitutional: Oriented to person, place, and time and well-developed, well-nourished, and  in no distress.  HENT:  Head: Normocephalic and atraumatic.  Mouth/Throat: Oropharynx is clear and moist. No oropharyngeal exudate.  Eyes: Conjunctivae are normal. Right eye exhibits no discharge. Left eye exhibits no discharge. No scleral icterus.  Neck: Normal range of motion. Neck supple.  Cardiovascular: Normal rate, regular rhythm, normal heart sounds and intact distal pulses.   Pulmonary/Chest: Effort normal and breath sounds normal. No respiratory distress. No wheezes. No rales.  Abdominal: Soft. Bowel sounds are normal. Exhibits no distension and no mass. There is no tenderness.  Musculoskeletal: Normal range of motion. Exhibits no  edema.  Lymphadenopathy:    No cervical adenopathy.  Neurological: Alert and oriented to person, place, and time. Exhibits normal muscle tone. Gait normal. Coordination normal.  Skin: Skin is warm and dry. No rash noted. Not diaphoretic. No erythema. No pallor.  Psychiatric: Mood, memory and judgment normal.  Vitals reviewed.  LABORATORY DATA: Lab Results  Component Value Date   WBC 11.4 (H) 06/29/2020   HGB 13.1 06/29/2020   HCT 42.0 06/29/2020   MCV 100.7 (H) 06/29/2020   PLT 300 06/29/2020      Chemistry      Component Value Date/Time   NA 137 06/29/2020 0830   NA 139 08/14/2017 0837   K 4.1 06/29/2020 0830   K 4.4 08/14/2017 0837   CL 104 06/29/2020 0830   CO2 23 06/29/2020 0830   CO2 24 08/14/2017 0837   BUN 15 06/29/2020 0830   BUN 13.3 08/14/2017 0837   CREATININE 0.92 06/29/2020 0830   CREATININE 0.75 12/02/2019 0753   CREATININE 0.8 08/14/2017 0837      Component Value Date/Time   CALCIUM 9.6 06/29/2020 0830   CALCIUM 9.3 08/14/2017 0837   ALKPHOS 99 06/29/2020 0830   ALKPHOS 107 08/14/2017 0837   AST 13 (L) 06/29/2020 0830   AST 17 12/02/2019 0753   AST 12 08/14/2017 0837   ALT 14 06/29/2020 0830   ALT 16 12/02/2019 0753   ALT 12 08/14/2017 0837   BILITOT 0.5 06/29/2020 0830   BILITOT 0.4 12/02/2019 0753   BILITOT 0.37 08/14/2017 0837       RADIOGRAPHIC STUDIES:  CT Chest W Contrast  Result Date: 06/04/2020 CLINICAL DATA:  Lung cancer restaging EXAM: CT CHEST, ABDOMEN, AND PELVIS WITH CONTRAST TECHNIQUE: Multidetector CT imaging of the chest, abdomen and pelvis was performed following the standard protocol during bolus administration of intravenous contrast. CONTRAST:  11m ISOVUE-300 IOPAMIDOL (ISOVUE-300) INJECTION 61%, additional oral enteric contrast COMPARISON:  03/12/2020 FINDINGS: CT CHEST FINDINGS Cardiovascular: Aortic atherosclerosis. Normal heart size. No pericardial effusion. Mediastinum/Nodes: No enlarged mediastinal, hilar, or axillary  lymph nodes. Thyroid gland, trachea, and esophagus demonstrate no significant findings. Lungs/Pleura: Moderate centrilobular emphysema. Unchanged post treatment volume loss and consolidation of the right pulmonary apex (series 4, image 25). No pleural effusion or pneumothorax. Musculoskeletal: No chest wall mass or suspicious bone lesions identified. CT ABDOMEN PELVIS FINDINGS Hepatobiliary: No solid liver abnormality is seen. No gallstones, gallbladder wall thickening, or biliary dilatation. Pancreas: Unremarkable. No pancreatic ductal dilatation or surrounding inflammatory changes. Spleen: Normal in size without significant abnormality. Adrenals/Urinary Tract: Adrenal glands are unremarkable. Kidneys are normal, without renal calculi, solid lesion, or hydronephrosis. Bladder is unremarkable. Stomach/Bowel: Stomach is within normal limits. Appendix appears normal. No evidence of bowel wall thickening, distention, or inflammatory changes. Redemonstrated postoperative findings of sigmoid colon resection. Vascular/Lymphatic: Aortic atherosclerosis. No enlarged abdominal or pelvic lymph nodes. Reproductive: Status post hysterectomy. Other: No abdominal wall hernia or abnormality.  No abdominopelvic ascites. Musculoskeletal: No acute or significant osseous findings. IMPRESSION: 1. Unchanged post treatment volume loss and consolidation of the right pulmonary apex. 2. Scattered irregular and ground-glass opacities seen in the right upper lobe on prior examination are resolved, consistent with resolution of nonspecific infection or inflammation. 3. No evidence of recurrent or metastatic disease in the chest, abdomen, or pelvis. 4. Emphysema (ICD10-J43.9). 5. Aortic Atherosclerosis (ICD10-I70.0). Electronically Signed   By: Eddie Candle M.D.   On: 06/04/2020 08:41   CT Abdomen Pelvis W Contrast  Result Date: 06/04/2020 CLINICAL DATA:  Lung cancer restaging EXAM: CT CHEST, ABDOMEN, AND PELVIS WITH CONTRAST TECHNIQUE:  Multidetector CT imaging of the chest, abdomen and pelvis was performed following the standard protocol during bolus administration of intravenous contrast. CONTRAST:  111m ISOVUE-300 IOPAMIDOL (ISOVUE-300) INJECTION 61%, additional oral enteric contrast COMPARISON:  03/12/2020 FINDINGS: CT CHEST FINDINGS Cardiovascular: Aortic atherosclerosis. Normal heart size. No pericardial effusion. Mediastinum/Nodes: No enlarged mediastinal, hilar, or axillary lymph nodes. Thyroid gland, trachea, and esophagus demonstrate no significant findings. Lungs/Pleura: Moderate centrilobular emphysema. Unchanged post treatment volume loss and consolidation of the right pulmonary apex (series 4, image 25). No pleural effusion or pneumothorax. Musculoskeletal: No chest wall mass or suspicious bone lesions identified. CT ABDOMEN PELVIS FINDINGS Hepatobiliary: No solid liver abnormality is seen. No gallstones, gallbladder wall thickening, or biliary dilatation. Pancreas: Unremarkable. No pancreatic ductal dilatation or surrounding inflammatory changes. Spleen: Normal in size without significant abnormality. Adrenals/Urinary Tract: Adrenal glands are unremarkable. Kidneys are normal, without renal calculi, solid lesion, or hydronephrosis. Bladder is unremarkable. Stomach/Bowel: Stomach is within normal limits. Appendix appears normal. No evidence of bowel wall thickening, distention, or inflammatory changes. Redemonstrated postoperative findings of sigmoid colon resection. Vascular/Lymphatic: Aortic atherosclerosis. No enlarged abdominal or pelvic lymph nodes. Reproductive: Status post hysterectomy. Other: No abdominal wall hernia or abnormality. No abdominopelvic ascites. Musculoskeletal: No acute or significant osseous findings. IMPRESSION: 1. Unchanged post treatment volume loss and consolidation of the right pulmonary apex. 2. Scattered irregular and ground-glass opacities seen in the right upper lobe on prior examination are resolved,  consistent with resolution of nonspecific infection or inflammation. 3. No evidence of recurrent or metastatic disease in the chest, abdomen, or pelvis. 4. Emphysema (ICD10-J43.9). 5. Aortic Atherosclerosis (ICD10-I70.0). Electronically Signed   By: AEddie CandleM.D.   On: 06/04/2020 08:41     ASSESSMENT/PLAN:  This isa very pleasant [age]year old Caucasian female with stage IV non-small cell lung cancer, adenocarcinoma who presented with a right upper lobe lungmass in addition to a solitary brain metastatsis. She was diagnosed in January 2015. The patient had completed induction systemic chemotherapy with carboplatin and Alimta with a partial response. The patient is currently being treated with single agentmaintenanceAlimta. She is status post105cycles.    Recommend that she proceed with cycle #106 today as scheduled. I reviewed her blood glucose with Dr. MJulien Nordmann Dr. MJulien Nordmanndoes not recommend administering insulin in the clinic today due to concerns with hypoglycemia. He recommends that she follow up with her PCP regarding her lab work from last week and for further management if found to have diabetes. The patient will call her PCP as soon as she leaves the office today.   She will continue to take her decadron the day before, the day of, and the day after chemotherapy.  We will see her back for a follow up visit in 3 weeks for evaluation before starting cycle #107.  She will receive a flu shot while in the clinic today.  The patient was advised to call immediately if she has any concerning symptoms in the interval. The patient voices understanding of current disease status and treatment options and is in agreement with the current care plan. All questions were answered. The patient knows to call the clinic with any problems, questions or concerns. We can certainly see the patient much sooner if necessary  No orders of the defined types were placed in this encounter.     Dejane Scheibe L Devone Bonilla, PA-C 06/29/20

## 2020-06-29 ENCOUNTER — Other Ambulatory Visit: Payer: Self-pay

## 2020-06-29 ENCOUNTER — Inpatient Hospital Stay: Payer: Medicare Other

## 2020-06-29 ENCOUNTER — Inpatient Hospital Stay: Payer: Medicare Other | Attending: Internal Medicine | Admitting: Physician Assistant

## 2020-06-29 ENCOUNTER — Encounter: Payer: Self-pay | Admitting: Physician Assistant

## 2020-06-29 VITALS — HR 96

## 2020-06-29 VITALS — BP 128/74 | HR 108 | Temp 97.2°F | Resp 17 | Ht 66.0 in | Wt 190.9 lb

## 2020-06-29 DIAGNOSIS — E119 Type 2 diabetes mellitus without complications: Secondary | ICD-10-CM | POA: Diagnosis not present

## 2020-06-29 DIAGNOSIS — C3411 Malignant neoplasm of upper lobe, right bronchus or lung: Secondary | ICD-10-CM | POA: Diagnosis not present

## 2020-06-29 DIAGNOSIS — Z23 Encounter for immunization: Secondary | ICD-10-CM | POA: Insufficient documentation

## 2020-06-29 DIAGNOSIS — Z79899 Other long term (current) drug therapy: Secondary | ICD-10-CM | POA: Insufficient documentation

## 2020-06-29 DIAGNOSIS — Z5111 Encounter for antineoplastic chemotherapy: Secondary | ICD-10-CM | POA: Diagnosis not present

## 2020-06-29 DIAGNOSIS — F419 Anxiety disorder, unspecified: Secondary | ICD-10-CM | POA: Diagnosis not present

## 2020-06-29 DIAGNOSIS — C7931 Secondary malignant neoplasm of brain: Secondary | ICD-10-CM | POA: Diagnosis not present

## 2020-06-29 DIAGNOSIS — Z5112 Encounter for antineoplastic immunotherapy: Secondary | ICD-10-CM | POA: Insufficient documentation

## 2020-06-29 LAB — CBC WITH DIFFERENTIAL/PLATELET
Abs Immature Granulocytes: 0.09 10*3/uL — ABNORMAL HIGH (ref 0.00–0.07)
Basophils Absolute: 0 10*3/uL (ref 0.0–0.1)
Basophils Relative: 0 %
Eosinophils Absolute: 0 10*3/uL (ref 0.0–0.5)
Eosinophils Relative: 0 %
HCT: 42 % (ref 36.0–46.0)
Hemoglobin: 13.1 g/dL (ref 12.0–15.0)
Immature Granulocytes: 1 %
Lymphocytes Relative: 4 %
Lymphs Abs: 0.5 10*3/uL — ABNORMAL LOW (ref 0.7–4.0)
MCH: 31.4 pg (ref 26.0–34.0)
MCHC: 31.2 g/dL (ref 30.0–36.0)
MCV: 100.7 fL — ABNORMAL HIGH (ref 80.0–100.0)
Monocytes Absolute: 0.5 10*3/uL (ref 0.1–1.0)
Monocytes Relative: 4 %
Neutro Abs: 10.3 10*3/uL — ABNORMAL HIGH (ref 1.7–7.7)
Neutrophils Relative %: 91 %
Platelets: 300 10*3/uL (ref 150–400)
RBC: 4.17 MIL/uL (ref 3.87–5.11)
RDW: 14.7 % (ref 11.5–15.5)
WBC: 11.4 10*3/uL — ABNORMAL HIGH (ref 4.0–10.5)
nRBC: 0 % (ref 0.0–0.2)

## 2020-06-29 LAB — COMPREHENSIVE METABOLIC PANEL WITH GFR
ALT: 14 U/L (ref 0–44)
AST: 13 U/L — ABNORMAL LOW (ref 15–41)
Albumin: 3.3 g/dL — ABNORMAL LOW (ref 3.5–5.0)
Alkaline Phosphatase: 99 U/L (ref 38–126)
Anion gap: 10 (ref 5–15)
BUN: 15 mg/dL (ref 8–23)
CO2: 23 mmol/L (ref 22–32)
Calcium: 9.6 mg/dL (ref 8.9–10.3)
Chloride: 104 mmol/L (ref 98–111)
Creatinine, Ser: 0.92 mg/dL (ref 0.44–1.00)
GFR, Estimated: >60 mL/min
Glucose, Bld: 325 mg/dL — ABNORMAL HIGH (ref 70–99)
Potassium: 4.1 mmol/L (ref 3.5–5.1)
Sodium: 137 mmol/L (ref 135–145)
Total Bilirubin: 0.5 mg/dL (ref 0.3–1.2)
Total Protein: 6.8 g/dL (ref 6.5–8.1)

## 2020-06-29 MED ORDER — INFLUENZA VAC A&B SA ADJ QUAD 0.5 ML IM PRSY
PREFILLED_SYRINGE | INTRAMUSCULAR | Status: AC
  Filled 2020-06-29: qty 0.5

## 2020-06-29 MED ORDER — SODIUM CHLORIDE 0.9 % IV SOLN
Freq: Once | INTRAVENOUS | Status: AC
  Filled 2020-06-29: qty 250

## 2020-06-29 MED ORDER — SODIUM CHLORIDE 0.9 % IV SOLN
10.0000 mg | Freq: Once | INTRAVENOUS | Status: AC
  Administered 2020-06-29: 10 mg via INTRAVENOUS
  Filled 2020-06-29: qty 10

## 2020-06-29 MED ORDER — INFLUENZA VAC A&B SA ADJ QUAD 0.5 ML IM PRSY
0.5000 mL | PREFILLED_SYRINGE | Freq: Once | INTRAMUSCULAR | Status: AC
  Administered 2020-06-29: 0.5 mL via INTRAMUSCULAR

## 2020-06-29 MED ORDER — SODIUM CHLORIDE 0.9 % IV SOLN
500.0000 mg/m2 | Freq: Once | INTRAVENOUS | Status: AC
  Administered 2020-06-29: 900 mg via INTRAVENOUS
  Filled 2020-06-29: qty 20

## 2020-06-29 NOTE — Patient Instructions (Signed)
[facility] Discharge Instructions for Patients Receiving Chemotherapy  Today you received the following chemotherapy agents: Alimta  To help prevent nausea and vomiting after your treatment, we encourage you to take your nausea medication  as prescribed.    If you develop nausea and vomiting that is not controlled by your nausea medication, call the clinic.   BELOW ARE SYMPTOMS THAT SHOULD BE REPORTED IMMEDIATELY:  *FEVER GREATER THAN 100.5 F  *CHILLS WITH OR WITHOUT FEVER  NAUSEA AND VOMITING THAT IS NOT CONTROLLED WITH YOUR NAUSEA MEDICATION  *UNUSUAL SHORTNESS OF BREATH  *UNUSUAL BRUISING OR BLEEDING  TENDERNESS IN MOUTH AND THROAT WITH OR WITHOUT PRESENCE OF ULCERS  *URINARY PROBLEMS  *BOWEL PROBLEMS  UNUSUAL RASH Items with * indicate a potential emergency and should be followed up as soon as possible.  Feel free to call the clinic should you have any questions or concerns. The clinic phone number is [phone].  Please show the [place] at check-in to the Emergency Department and triage nurse.  Influenza Virus Vaccine injection What is this medicine? INFLUENZA VIRUS VACCINE (in floo EN zuh VAHY ruhs vak SEEN) helps to reduce the risk of getting influenza also known as the flu. The vaccine only helps protect you against some strains of the flu. This medicine may be used for other purposes; ask your health care provider or pharmacist if you have questions. COMMON BRAND NAME(S): Afluria, Afluria Quadrivalent, Agriflu, Alfuria, FLUAD, Fluarix, Fluarix Quadrivalent, Flublok, Flublok Quadrivalent, FLUCELVAX, FLUCELVAX Quadrivalent, Flulaval, Flulaval Quadrivalent, Fluvirin, Fluzone, Fluzone High-Dose, Fluzone Intradermal, Fluzone Quadrivalent What should I tell my health care provider before I take this medicine? They need to know if you have any of these conditions:  bleeding disorder like hemophilia  fever or infection  Guillain-Barre  syndrome or other neurological problems  immune system problems  infection with the human immunodeficiency virus (HIV) or AIDS  low blood platelet counts  multiple sclerosis  an unusual or allergic reaction to influenza virus vaccine, latex, other medicines, foods, dyes, or preservatives. Different brands of vaccines contain different allergens. Some may contain latex or eggs. Talk to your doctor about your allergies to make sure that you get the right vaccine.  pregnant or trying to get pregnant  breast-feeding How should I use this medicine? This vaccine is for injection into a muscle or under the skin. It is given by a health care professional. A copy of Vaccine Information Statements will be given before each vaccination. Read this sheet carefully each time. The sheet may change frequently. Talk to your healthcare provider to see which vaccines are right for you. Some vaccines should not be used in all age groups. Overdosage: If you think you have taken too much of this medicine contact a poison control center or emergency room at once. NOTE: This medicine is only for you. Do not share this medicine with others. What if I miss a dose? This does not apply. What may interact with this medicine?  chemotherapy or radiation therapy  medicines that lower your immune system like etanercept, anakinra, infliximab, and adalimumab  medicines that treat or prevent blood clots like warfarin  phenytoin  steroid medicines like prednisone or cortisone  theophylline  vaccines This list may not describe all possible interactions. Give your health care provider a list of all the medicines, herbs, non-prescription drugs, or dietary supplements you use. Also tell them if you smoke, drink alcohol, or use illegal drugs. Some items may interact with your medicine.  What should I watch for while using this medicine? Report any side effects that do not go away within 3 days to your doctor or health  care professional. Call your health care provider if any unusual symptoms occur within 6 weeks of receiving this vaccine. You may still catch the flu, but the illness is not usually as bad. You cannot get the flu from the vaccine. The vaccine will not protect against colds or other illnesses that may cause fever. The vaccine is needed every year. What side effects may I notice from receiving this medicine? Side effects that you should report to your doctor or health care professional as soon as possible:  allergic reactions like skin rash, itching or hives, swelling of the face, lips, or tongue Side effects that usually do not require medical attention (report to your doctor or health care professional if they continue or are bothersome):  fever  headache  muscle aches and pains  pain, tenderness, redness, or swelling at the injection site  tiredness This list may not describe all possible side effects. Call your doctor for medical advice about side effects. You may report side effects to FDA at 1-800-FDA-1088. Where should I keep my medicine? The vaccine will be given by a health care professional in a clinic, pharmacy, doctor's office, or other health care setting. You will not be given vaccine doses to store at home. NOTE: This sheet is a summary. It may not cover all possible information. If you have questions about this medicine, talk to your doctor, pharmacist, or health care provider.  2020 Elsevier/Gold Standard (2018-07-10 08:45:43)   Hyperglycemia Hyperglycemia is when the sugar (glucose) level in your blood is too high. It may not cause symptoms. If you do have symptoms, they may include warning signs, such as:  Feeling more thirsty than normal.  Hunger.  Feeling tired.  Needing to pee (urinate) more than normal.  Blurry eyesight (vision). You may get other symptoms as it gets worse, such as:  Dry mouth.  Not being hungry (loss of appetite).  Fruity-smelling  breath.  Weakness.  Weight gain or loss that is not planned. Weight loss may be fast.  A tingling or numb feeling in your hands or feet.  Headache.  Skin that does not bounce back quickly when it is lightly pinched and released (poor skin turgor).  Pain in your belly (abdomen).  Cuts or bruises that heal slowly. High blood sugar can happen to people who do or do not have diabetes. High blood sugar can happen slowly or quickly, and it can be an emergency. Follow these instructions at home: General instructions  Take over-the-counter and prescription medicines only as told by your doctor.  Do not use products that contain nicotine or tobacco, such as cigarettes and e-cigarettes. If you need help quitting, ask your doctor.  Limit alcohol intake to no more than 1 drink per day for nonpregnant women and 2 drinks per day for men. One drink equals 12 oz of beer, 5 oz of wine, or 1 oz of hard liquor.  Manage stress. If you need help with this, ask your doctor.  Keep all follow-up visits as told by your doctor. This is important. Eating and drinking   Stay at a healthy weight.  Exercise regularly, as told by your doctor.  Drink enough fluid, especially when you: ? Exercise. ? Get sick. ? Are in hot temperatures.  Eat healthy foods, such as: ? Low-fat (lean) proteins. ? Complex carbs (complex carbohydrates),  such as whole wheat bread or brown rice. ? Fresh fruits and vegetables. ? Low-fat dairy products. ? Healthy fats.  Drink enough fluid to keep your pee (urine) clear or pale yellow. If you have diabetes:   Make sure you know the symptoms of hyperglycemia.  Follow your diabetes management plan, as told by your doctor. Make sure you: ? Take insulin and medicines as told. ? Follow your exercise plan. ? Follow your meal plan. Eat on time. Do not skip meals. ? Check your blood sugar as often as told. Make sure to check before and after exercise. If you exercise longer or  in a different way than you normally do, check your blood sugar more often. ? Follow your sick day plan whenever you cannot eat or drink normally. Make this plan ahead of time with your doctor.  Share your diabetes management plan with people in your workplace, school, and household.  Check your urine for ketones when you are ill and as told by your doctor.  Carry a card or wear jewelry that says that you have diabetes. Contact a doctor if:  Your blood sugar level is higher than 240 mg/dL (13.3 mmol/L) for 2 days in a row.  You have problems keeping your blood sugar in your target range.  High blood sugar happens often for you. Get help right away if:  You have trouble breathing.  You have a change in how you think, feel, or act (mental status).  You feel sick to your stomach (nauseous), and that feeling does not go away.  You cannot stop throwing up (vomiting). These symptoms may be an emergency. Do not wait to see if the symptoms will go away. Get medical help right away. Call your local emergency services (911 in the [place] Do not drive yourself to the hospital. Summary  Hyperglycemia is when the sugar (glucose) level in your blood is too high.  High blood sugar can happen to people who do or do not have diabetes.  Make sure you drink enough fluids, eat healthy foods, and exercise regularly.  Contact your doctor if you have problems keeping your blood sugar in your target range. This information is not intended to replace advice given to you by your health care provider. Make sure you discuss any questions you have with your health care provider. Document Revised: 05/02/2016 Document Reviewed: 05/02/2016 Elsevier Patient Education  [place].

## 2020-07-08 ENCOUNTER — Other Ambulatory Visit: Payer: Self-pay

## 2020-07-08 ENCOUNTER — Encounter: Payer: Self-pay | Admitting: Radiation Oncology

## 2020-07-08 DIAGNOSIS — E119 Type 2 diabetes mellitus without complications: Secondary | ICD-10-CM | POA: Insufficient documentation

## 2020-07-09 ENCOUNTER — Ambulatory Visit
Admission: RE | Admit: 2020-07-09 | Discharge: 2020-07-09 | Disposition: A | Discharge status: Home / Self Care | Payer: Medicare Other | Source: Ambulatory Visit | Attending: Radiation Oncology | Admitting: Radiation Oncology

## 2020-07-09 DIAGNOSIS — G936 Cerebral edema: Secondary | ICD-10-CM | POA: Diagnosis not present

## 2020-07-09 DIAGNOSIS — I618 Other nontraumatic intracerebral hemorrhage: Secondary | ICD-10-CM | POA: Diagnosis not present

## 2020-07-09 DIAGNOSIS — C7931 Secondary malignant neoplasm of brain: Secondary | ICD-10-CM

## 2020-07-09 DIAGNOSIS — C3490 Malignant neoplasm of unspecified part of unspecified bronchus or lung: Secondary | ICD-10-CM | POA: Diagnosis not present

## 2020-07-09 DIAGNOSIS — G9389 Other specified disorders of brain: Secondary | ICD-10-CM | POA: Diagnosis not present

## 2020-07-09 MED ORDER — GADOBENATE DIMEGLUMINE 529 MG/ML IV SOLN
17.0000 mL | Freq: Once | INTRAVENOUS | Status: AC | PRN
  Administered 2020-07-09: 17 mL via INTRAVENOUS

## 2020-07-13 ENCOUNTER — Ambulatory Visit
Admission: RE | Admit: 2020-07-13 | Discharge: 2020-07-13 | Disposition: A | Discharge status: Home / Self Care | Payer: Medicare Other | Source: Ambulatory Visit | Attending: Radiation Oncology | Admitting: Radiation Oncology

## 2020-07-13 ENCOUNTER — Inpatient Hospital Stay: Payer: Medicare Other

## 2020-07-13 DIAGNOSIS — C3411 Malignant neoplasm of upper lobe, right bronchus or lung: Secondary | ICD-10-CM

## 2020-07-13 DIAGNOSIS — C7931 Secondary malignant neoplasm of brain: Secondary | ICD-10-CM

## 2020-07-13 DIAGNOSIS — Z923 Personal history of irradiation: Secondary | ICD-10-CM | POA: Diagnosis not present

## 2020-07-13 DIAGNOSIS — Z85841 Personal history of malignant neoplasm of brain: Secondary | ICD-10-CM | POA: Diagnosis not present

## 2020-07-13 DIAGNOSIS — Z85118 Personal history of other malignant neoplasm of bronchus and lung: Secondary | ICD-10-CM | POA: Diagnosis not present

## 2020-07-13 DIAGNOSIS — E119 Type 2 diabetes mellitus without complications: Secondary | ICD-10-CM

## 2020-07-13 DIAGNOSIS — C7949 Secondary malignant neoplasm of other parts of nervous system: Secondary | ICD-10-CM

## 2020-07-13 HISTORY — DX: Type 2 diabetes mellitus without complications: E11.9

## 2020-07-13 NOTE — Progress Notes (Signed)
Radiation Oncology         [phone]) [phone]  Outpatient Follow Up - Conducted via telephone due to current COVID-19 concerns for limiting patient exposure  I spoke with the patient to conduct this consult visit via telephone to spare the patient unnecessary potential exposure in the healthcare setting during the current COVID-19 pandemic. The patient was notified in advance and was offered a Welcome meeting to allow for face to face communication but unfortunately reported that they did not have the appropriate resources/technology to support such a visit and instead preferred to proceed with a telephone discussion.  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  Interval Since Last Radiation: 6 years, 7 months  10/28/2013 through 12/05/2013:  The patient was treated to the right lung tumor to a dose of 50 gray in 25 fractions using a 3-D conformal technique. Daily image guidance was used for the patient's treatment.  10/16/2013 SRS Treatment: PTV1: Rt Parietal 57mm target was treated using 3 Arcs to a prescription dose of 20 Gy. ExacTrac Snap verification was performed for each couch angle.  Narrative:  The patient is contacted for routine follow-up. In summary this is a 69 y.o. patient with a history of metastatic lung cancer to the brain who was treated in two sessions in 2015 for her brain disease, followed by local control to the right lung. Since her treatment, she has done well and continues to be NED in the brain, and continues to remain with stable disease on systemic alimta. She is receiving her 98th cycle of this today.  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 She is no  longer taking these medications. Her most recent MRI of the brain was on 07/09/20 and again showed stability of her treated lesion in the parietal region without concerns for active malignancy and no new lesions. She remains on maintenance Alimta with Dr. Julien Nordmann every 3 weeks and films in October showed no concerns for active disease.   On review of systems, the patient reports that she is doing well overall. She has recently been diagnosed with diabetes. She denies any chest pain, shortness of breath, cough, fevers, chills, night sweats, unintended weight changes. She denies any bowel or bladder disturbances, and denies abdominal pain, nausea or vomiting. She denies any new musculoskeletal or joint aches or pains, new skin lesions or concerns. A complete review of systems is obtained and is otherwise negative.    Past Medical History:  Past Medical History:  Diagnosis Date  . Anxiety   . Anxiety 06/20/2016  . Cervical cancer ([place])   . Diabetes mellitus without complication [facility])    patient states she has type 2  . Encounter for antineoplastic chemotherapy 07/20/2015  . 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 ABDOMINAL HYSTERECTOMY    . COLOSTOMY TAKEDOWN N/A 07/10/2014   Procedure: LAPAROSCOPIC LYSIS OF ADHESIONS (90 MIN) LAPAROSCOPIC ASSISTED COLOSTOMY CLOSURE, RIGID PROCTOSIGMOIDOSCOPY;  Surgeon: Jackolyn Confer, MD;  Location: [facility];  Service: General;  Laterality: N/A;  . LAPAROTOMY N/A 11/03/2013  Procedure: EXPLORATORY LAPAROTOMY, DRAINAGE OF INTRA  ABDOMINAL ABSCESSES, MOBILIZATION OF SPLENIC FLEXURE, SIGMOID COLECTOMY WITH COLOSTOMY;  Surgeon: Odis Hollingshead, MD;  Location: [facility];  Service: General;  Laterality: N/A;  . VIDEO BRONCHOSCOPY Bilateral 08/30/2013   Procedure: VIDEO BRONCHOSCOPY WITH FLUORO;  Surgeon: Tanda Rockers, MD;  Location: [facility];  Service: Cardiopulmonary;  Laterality: Bilateral;    Social History:  Social History   Socioeconomic History  . Marital status: Married    Spouse name: Not on file  . Number of children: Not on file  . Years of education: Not on file  . Highest education level: Not on file  Occupational History  . 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 Smoking status: Former Smoker    Packs/day: 1.00    Years: 40.00    Pack years: 40.00    Types: Cigarettes    Quit date: 09/27/2013    Years since quitting: 6.7  . Smokeless tobacco: Never Used  Vaping Use  . Vaping Use: Never used  Substance and Sexual Activity  . Alcohol use: No  . Drug use: No  . Sexual activity: Yes  Other Topics Concern  . Not on file  Social History Narrative  . Not on file   Social Determinants of Health   Financial Resource Strain:   . Difficulty of Paying Living Expenses: Not on file  Food Insecurity:   . Worried About Charity fundraiser in the Last Year: Not on file  . Ran Out of Food in the Last Year: Not on file  Transportation Needs:   . Lack of Transportation (Medical): Not on file  . Lack of Transportation (Non-Medical): Not on file  Physical Activity:   . Days of Exercise per Week: Not on file  . Minutes of Exercise per Session: Not on file  Stress:   . Feeling of Stress : Not on file  Social Connections:   . Frequency of Communication with Friends and Family: Not on file  . Frequency of Social Gatherings with Friends and Family: Not on file  . Attends Religious Services: Not on file  . Active Member of Clubs or Organizations: Not on file  . Attends Archivist Meetings: Not on file  . Marital Status: Not on file  Intimate Partner Violence:   . Fear of Current or Ex-Partner: Not on file  . Emotionally Abused: Not on file  . Physically Abused: Not on file  . Sexually Abused: Not on file  The patient is married. She lives  in [place], [place].  Family History: Family History  Problem Relation Age of Onset  . Emphysema Father        smoked  . Lung cancer Father        smoked  . Cancer Mother   . Hypertension Mother   . COPD Mother     ALLERGIES:  is allergic to codeine.  Meds: Current Outpatient Medications  Medication Sig Dispense Refill  . acetaminophen (TYLENOL) 500 MG tablet Take 500 mg by mouth every 6 (six) hours as needed for mild pain or headache. Reported on 11/02/2015    . ALPRAZolam (XANAX) 1 MG tablet SMARTSIG:1 Tablet(s) By Mouth 1 to 3 Times Daily PRN    . aspirin EC 81 MG tablet Take 1 tablet (81 mg total) by mouth daily. 150 tablet 2  . [employer] ACID CONTROLLER 10 MG tablet SMARTSIG:1 Tablet(s) By Mouth Every 12 Hours PRN    .  dexamethasone (DECADRON) 4 MG tablet TAKE 1 PILL TWICE A DAY THE DAY BEFORE, THE DAY OF, AND THE DAY AFTER CHEMOTHERAPY. 40 tablet 2  . folic acid (FOLVITE) 1 MG tablet TAKE 1 TABLET BY MOUTH EVERY DAY 90 tablet 0  . Multiple Vitamin (MULTIVITAMIN WITH MINERALS) TABS tablet Take 1 tablet by mouth every morning.    . ondansetron (ZOFRAN) 8 MG tablet TAKE 1 TABLET BY MOUTH BEFORE CHEMO 30 tablet 0  . senna-docusate (SENOKOT-S) 8.6-50 MG tablet Take 1 tablet by mouth daily. 30 tablet prn  . Vitamin D, Ergocalciferol, (DRISDOL) 1.25 MG (50000 UNIT) CAPS capsule Take 50,000 Units by mouth once a week.    . Ascorbic Acid (VITAMIN C GUMMIE PO) Take 1 each by mouth every morning. (Patient not taking: Reported on 07/08/2020)    . ciprofloxacin (CIPRO) 750 MG tablet Take 750 mg by mouth 2 (two) times daily. (Patient not taking: Reported on 07/08/2020)    . HYDROcodone-acetaminophen (NORCO/VICODIN) 5-325 MG tablet TAKE 1 TABLET BY MOUTH 3 TIMES A DAY FOR 5 DAYS AS NEEDED FOR PAIN (Patient not taking: Reported on 07/08/2020)    . OVER THE COUNTER MEDICATION Take 1 tablet by mouth every morning. (Vitamin A) (Patient not taking: Reported on 07/08/2020)    . prochlorperazine  (COMPAZINE) 10 MG tablet Take 1 tablet (10 mg total) by mouth every 6 (six) hours as needed for nausea or vomiting. (Patient not taking: Reported on 07/08/2020) 60 tablet 0  . rosuvastatin (CRESTOR) 10 MG tablet Take 1 tablet (10 mg total) by mouth daily. (Patient not taking: Reported on 07/08/2020) 30 tablet 12  . valACYclovir (VALTREX) 1000 MG tablet Take 1,000 mg by mouth 3 (three) times daily. (Patient not taking: Reported on 07/08/2020)     No current facility-administered medications for this encounter.   Facility-Administered Medications Ordered in Other Encounters  Medication Dose Route Frequency Provider Last Rate Last Admin  . sodium chloride flush (NS) 0.9 % injection 10 mL  10 mL Intravenous PRN Curt Bears, MD      . sodium chloride flush (NS) 0.9 % injection 10 mL  10 mL Intravenous PRN Curt Bears, MD        Physical Findings: Unable to assess due to type of encounter  Lab Findings: Lab Results  Component Value Date   WBC 11.4 (H) 06/29/2020   HGB 13.1 06/29/2020   HCT 42.0 06/29/2020   MCV 100.7 (H) 06/29/2020   PLT 300 06/29/2020     Radiographic Findings: MR Brain W Wo Contrast  Result Date: 07/09/2020 CLINICAL DATA:  Metastatic lung cancer.  Follow-up treated lesion EXAM: MRI HEAD WITHOUT AND WITH CONTRAST TECHNIQUE: Multiplanar, multiecho pulse sequences of the brain and surrounding structures were obtained without and with intravenous contrast. CONTRAST:  15mL MULTIHANCE GADOBENATE DIMEGLUMINE 529 MG/ML IV SOLN COMPARISON:  MRI head 01/09/2020 FINDINGS: Brain: Treated lesion right parietal lobe is stable. This lesion is associated with mild chronic hemorrhage and residual enhancement which measures 16 x 8 mm. Adjacent white matter edema unchanged. Additional patchy white matter hyperintensity bilaterally compatible chronic microvascular ischemia. No acute infarct. No second mass lesion identified. Vascular: Normal arterial flow voids. Skull and upper  cervical spine: No focal skeletal lesion. Sinuses/Orbits: Paranasal sinuses clear. Bilateral cataract extraction. Other: None IMPRESSION: Stable treated lesion right parietal lobe with residual enhancement and edema. No new lesion identified. Electronically Signed   By: Franchot Gallo M.D.   On: 07/09/2020 12:27    Impression/Plan: 1. Stage IV (T2a, N0,  M1b) non-small cell lung cancer of the right upper lobe consistent with adenocarcinoma with metastasis to the brain. The patient remains without disease in the brain. She continues to do exceptionally well also with systemic Alimta with Dr. Julien Nordmann as she has previously. Current NCCN guidelines recommend repeating imaging q 6 months indefinitely. We will plan to follow up with her in the spring. She is in agreement with this plan. 2. Recent diagnosis of Diabetes. She will follow up with Nile Riggs, NP in PCP for management of this but is trying to cut out carbohydrates in her diet as well.  Given current concerns for patient exposure during the COVID-19 pandemic, this encounter was conducted via telephone.  The patient has given verbal consent for this type of encounter. The time spent during this encounter was 35 minutes and 50% of that time was spent in the coordination of his care. The attendants for this meeting include Shona Simpson, [facility] and Alexis [name] and her husband Alexis [name].  During the encounter, Shona Simpson [facility] was located at [facility] Radiation Oncology Department.  Alexis [name]  was located at the [facility] as well in infusion, while her husband was also at home.     Carola Rhine, PAC

## 2020-07-20 ENCOUNTER — Inpatient Hospital Stay (HOSPITAL_BASED_OUTPATIENT_CLINIC_OR_DEPARTMENT_OTHER): Payer: Medicare Other | Admitting: Internal Medicine

## 2020-07-20 ENCOUNTER — Inpatient Hospital Stay: Payer: Medicare Other

## 2020-07-20 ENCOUNTER — Encounter: Payer: Self-pay | Admitting: Internal Medicine

## 2020-07-20 ENCOUNTER — Other Ambulatory Visit: Payer: Self-pay

## 2020-07-20 VITALS — HR 90

## 2020-07-20 VITALS — BP 126/71 | HR 104 | Temp 98.6°F | Resp 17 | Ht 66.0 in | Wt 186.0 lb

## 2020-07-20 DIAGNOSIS — C3411 Malignant neoplasm of upper lobe, right bronchus or lung: Secondary | ICD-10-CM

## 2020-07-20 DIAGNOSIS — Z5111 Encounter for antineoplastic chemotherapy: Secondary | ICD-10-CM | POA: Diagnosis not present

## 2020-07-20 DIAGNOSIS — F419 Anxiety disorder, unspecified: Secondary | ICD-10-CM | POA: Diagnosis not present

## 2020-07-20 DIAGNOSIS — Z23 Encounter for immunization: Secondary | ICD-10-CM | POA: Diagnosis not present

## 2020-07-20 DIAGNOSIS — E119 Type 2 diabetes mellitus without complications: Secondary | ICD-10-CM | POA: Diagnosis not present

## 2020-07-20 DIAGNOSIS — Z5112 Encounter for antineoplastic immunotherapy: Secondary | ICD-10-CM | POA: Diagnosis not present

## 2020-07-20 DIAGNOSIS — C7931 Secondary malignant neoplasm of brain: Secondary | ICD-10-CM | POA: Diagnosis not present

## 2020-07-20 LAB — CBC WITH DIFFERENTIAL/PLATELET
Abs Immature Granulocytes: 0.06 10*3/uL (ref 0.00–0.07)
Basophils Absolute: 0 10*3/uL (ref 0.0–0.1)
Basophils Relative: 0 %
Eosinophils Absolute: 0 10*3/uL (ref 0.0–0.5)
Eosinophils Relative: 0 %
HCT: 39.3 % (ref 36.0–46.0)
Hemoglobin: 12.6 g/dL (ref 12.0–15.0)
Immature Granulocytes: 1 %
Lymphocytes Relative: 4 %
Lymphs Abs: 0.4 10*3/uL — ABNORMAL LOW (ref 0.7–4.0)
MCH: 32.4 pg (ref 26.0–34.0)
MCHC: 32.1 g/dL (ref 30.0–36.0)
MCV: 101 fL — ABNORMAL HIGH (ref 80.0–100.0)
Monocytes Absolute: 0.6 10*3/uL (ref 0.1–1.0)
Monocytes Relative: 6 %
Neutro Abs: 8.2 10*3/uL — ABNORMAL HIGH (ref 1.7–7.7)
Neutrophils Relative %: 89 %
Platelets: 282 10*3/uL (ref 150–400)
RBC: 3.89 MIL/uL (ref 3.87–5.11)
RDW: 14.9 % (ref 11.5–15.5)
WBC: 9.3 10*3/uL (ref 4.0–10.5)
nRBC: 0 % (ref 0.0–0.2)

## 2020-07-20 LAB — COMPREHENSIVE METABOLIC PANEL
ALT: 18 U/L (ref 0–44)
AST: 14 U/L — ABNORMAL LOW (ref 15–41)
Albumin: 3.6 g/dL (ref 3.5–5.0)
Alkaline Phosphatase: 75 U/L (ref 38–126)
Anion gap: 11 (ref 5–15)
BUN: 13 mg/dL (ref 8–23)
CO2: 22 mmol/L (ref 22–32)
Calcium: 9.3 mg/dL (ref 8.9–10.3)
Chloride: 107 mmol/L (ref 98–111)
Creatinine, Ser: 0.83 mg/dL (ref 0.44–1.00)
GFR, Estimated: >60 mL/min (ref >60)
Glucose, Bld: 156 mg/dL — ABNORMAL HIGH (ref 70–99)
Potassium: 4.1 mmol/L (ref 3.5–5.1)
Sodium: 140 mmol/L (ref 135–145)
Total Bilirubin: 0.4 mg/dL (ref 0.3–1.2)
Total Protein: 7 g/dL (ref 6.5–8.1)

## 2020-07-20 MED ORDER — CYANOCOBALAMIN 1000 MCG/ML IJ SOLN
INTRAMUSCULAR | Status: AC
  Filled 2020-07-20: qty 1

## 2020-07-20 MED ORDER — SODIUM CHLORIDE 0.9 % IV SOLN
10.0000 mg | Freq: Once | INTRAVENOUS | Status: AC
  Administered 2020-07-20: 10 mg via INTRAVENOUS
  Filled 2020-07-20: qty 10

## 2020-07-20 MED ORDER — SODIUM CHLORIDE 0.9 % IV SOLN
500.0000 mg/m2 | Freq: Once | INTRAVENOUS | Status: AC
  Administered 2020-07-20: 900 mg via INTRAVENOUS
  Filled 2020-07-20: qty 20

## 2020-07-20 MED ORDER — CYANOCOBALAMIN 1000 MCG/ML IJ SOLN
1000.0000 ug | Freq: Once | INTRAMUSCULAR | Status: AC
  Administered 2020-07-20: 1000 ug via INTRAMUSCULAR

## 2020-07-20 MED ORDER — SODIUM CHLORIDE 0.9 % IV SOLN
Freq: Once | INTRAVENOUS | Status: AC
  Filled 2020-07-20: qty 250

## 2020-07-20 NOTE — Progress Notes (Signed)
[name] [phone]   [phone]  OFFICE PROGRESS NOTE  Alexis [name], [place] [address]  DIAGNOSIS: Stage IV (T2a, N0, M1b) non-small cell lung cancer, adenocarcinoma with negative EGFR and ALK mutations diagnosed in January 2015 and presented with right upper lobe lung mass in addition to a solitary brain metastasis.  PRIOR THERAPY: 1) Status post stereotactic radiotherapy to a solitary right parietal brain lesion under the care of Dr. [name] on 10/16/2013. 2) Status post palliative radiotherapy to the right lung tumor under the care of Dr. [name] completed on 12/05/2013. 3) Systemic chemotherapy with carboplatin for AUC of 5 and Alimta 500 mg/M2 every 3 weeks. First dose Jan 06 2014. Status post 6 cycles.  CURRENT THERAPY: Systemic chemotherapy with maintenance Alimta 500 MG/M2 every 3 weeks, status post 106  cycles.  INTERVAL HISTORY: Alexis [name] 69 y.o. female returns to the clinic today for follow-up visit.  The patient is feeling fine today with no concerning complaints.  She was recently diagnosed with type 2 diabetes mellitus and started by her primary care physician on Metformin 500 mg p.o. twice daily.  The patient is feeling much better and she lost few pounds recently.  She is planning to undergo an exercise regimen to lose more weight.  She denied having any current chest pain, shortness of breath, cough or hemoptysis.  She denied having any nausea, vomiting, diarrhea or constipation.  She has no headache or visual changes.  She denied having any fever or chills.  She is here today for evaluation before starting cycle #107.  MEDICAL HISTORY: Past Medical History:  Diagnosis Date  . Anxiety   . Anxiety 06/20/2016  . Cervical cancer ([place])   . Diabetes mellitus without complication [facility])    patient states she has type 2  . Encounter for antineoplastic chemotherapy 07/20/2015  . Malignant neoplasm of  right upper lobe of lung (HCC)     non small cell lung cancer adenocarcioma with brain meta    ALLERGIES:  is allergic to codeine.  MEDICATIONS:  Current Outpatient Medications  Medication Sig Dispense Refill  . acetaminophen (TYLENOL) 500 MG tablet Take 500 mg by mouth every 6 (six) hours as needed for mild pain or headache. Reported on 11/02/2015    . ALPRAZolam (XANAX) 1 MG tablet SMARTSIG:1 Tablet(s) By Mouth 1 to 3 Times Daily PRN    . Ascorbic Acid (VITAMIN C GUMMIE PO) Take 1 each by mouth every morning. (Patient not taking: Reported on 07/08/2020)    . aspirin EC 81 MG tablet Take 1 tablet (81 mg total) by mouth daily. 150 tablet 2  . ciprofloxacin (CIPRO) 750 MG tablet Take 750 mg by mouth 2 (two) times daily. (Patient not taking: Reported on 07/08/2020)    . [employer] ACID CONTROLLER 10 MG tablet SMARTSIG:1 Tablet(s) By Mouth Every 12 Hours PRN    . dexamethasone (DECADRON) 4 MG tablet TAKE 1 PILL TWICE A DAY THE DAY BEFORE, THE DAY OF, AND THE DAY AFTER CHEMOTHERAPY. 40 tablet 2  . folic acid (FOLVITE) 1 MG tablet TAKE 1 TABLET BY MOUTH EVERY DAY 90 tablet 0  . HYDROcodone-acetaminophen (NORCO/VICODIN) 5-325 MG tablet TAKE 1 TABLET BY MOUTH 3 TIMES A DAY FOR 5 DAYS AS NEEDED FOR PAIN (Patient not taking: Reported on 07/08/2020)    . Multiple Vitamin (MULTIVITAMIN WITH MINERALS) TABS tablet Take 1 tablet by mouth every morning.    Marland Kitchen  ondansetron (ZOFRAN) 8 MG tablet TAKE 1 TABLET BY MOUTH BEFORE CHEMO 30 tablet 0  . OVER THE COUNTER MEDICATION Take 1 tablet by mouth every morning. (Vitamin A) (Patient not taking: Reported on 07/08/2020)    . prochlorperazine (COMPAZINE) 10 MG tablet Take 1 tablet (10 mg total) by mouth every 6 (six) hours as needed for nausea or vomiting. (Patient not taking: Reported on 07/08/2020) 60 tablet 0  . rosuvastatin (CRESTOR) 10 MG tablet Take 1 tablet (10 mg total) by mouth daily. (Patient not taking: Reported on 07/08/2020) 30 tablet 12  . senna-docusate  (SENOKOT-S) 8.6-50 MG tablet Take 1 tablet by mouth daily. 30 tablet prn  . valACYclovir (VALTREX) 1000 MG tablet Take 1,000 mg by mouth 3 (three) times daily. (Patient not taking: Reported on 07/08/2020)    . Vitamin D, Ergocalciferol, (DRISDOL) 1.25 MG (50000 UNIT) CAPS capsule Take 50,000 Units by mouth once a week.     No current facility-administered medications for this visit.   Facility-Administered Medications Ordered in Other Visits  Medication Dose Route Frequency Provider Last Rate Last Admin  . sodium chloride flush (NS) 0.9 % injection 10 mL  10 mL Intravenous PRN Curt Bears, MD      . sodium chloride flush (NS) 0.9 % injection 10 mL  10 mL Intravenous PRN Curt Bears, MD        SURGICAL HISTORY:  Past Surgical History:  Procedure Laterality Date  . ABDOMINAL HYSTERECTOMY    . COLOSTOMY TAKEDOWN N/A 07/10/2014   Procedure: LAPAROSCOPIC LYSIS OF ADHESIONS (90 MIN) LAPAROSCOPIC ASSISTED COLOSTOMY CLOSURE, RIGID PROCTOSIGMOIDOSCOPY;  Surgeon: Jackolyn Confer, MD;  Location: [facility];  Service: General;  Laterality: N/A;  . LAPAROTOMY N/A 11/03/2013   Procedure: EXPLORATORY LAPAROTOMY, DRAINAGE OF INTRA  ABDOMINAL ABSCESSES, MOBILIZATION OF SPLENIC FLEXURE, SIGMOID COLECTOMY WITH COLOSTOMY;  Surgeon: Odis Hollingshead, MD;  Location: [facility];  Service: General;  Laterality: N/A;  . VIDEO BRONCHOSCOPY Bilateral 08/30/2013   Procedure: VIDEO BRONCHOSCOPY WITH FLUORO;  Surgeon: Tanda Rockers, MD;  Location: [facility];  Service: Cardiopulmonary;  Laterality: Bilateral;    REVIEW OF SYSTEMS:  A comprehensive review of systems was negative.   PHYSICAL EXAMINATION: General appearance: alert, cooperative and no distress Head: Normocephalic, without obvious abnormality, atraumatic Neck: no adenopathy, no JVD, supple, symmetrical, trachea midline and thyroid not enlarged, symmetric, no tenderness/mass/nodules Lymph nodes: Cervical, supraclavicular, and axillary nodes  normal. Resp: clear to auscultation bilaterally Back: symmetric, no curvature. ROM normal. No CVA tenderness. Cardio: regular rate and rhythm, S1, S2 normal, no murmur, click, rub or gallop GI: soft, non-tender; bowel sounds normal; no masses,  no organomegaly Extremities: extremities normal, atraumatic, no cyanosis or edema   ECOG PERFORMANCE STATUS: 1 - Symptomatic but completely ambulatory  Blood pressure 126/71, pulse (!) 104, temperature 98.6 F (37 C), temperature source Tympanic, resp. rate 17, height 5' 6"  (1.676 m), weight 186 lb (84.4 kg), SpO2 99 %.  LABORATORY DATA: Lab Results  Component Value Date   WBC 9.3 07/20/2020   HGB 12.6 07/20/2020   HCT 39.3 07/20/2020   MCV 101.0 (H) 07/20/2020   PLT 282 07/20/2020      Chemistry      Component Value Date/Time   NA 137 06/29/2020 0830   NA 139 08/14/2017 0837   K 4.1 06/29/2020 0830   K 4.4 08/14/2017 0837   CL 104 06/29/2020 0830   CO2 23 06/29/2020 0830   CO2 24 08/14/2017 0837   BUN 15 06/29/2020 0830  BUN 13.3 08/14/2017 0837   CREATININE 0.92 06/29/2020 0830   CREATININE 0.75 12/02/2019 0753   CREATININE 0.8 08/14/2017 0837      Component Value Date/Time   CALCIUM 9.6 06/29/2020 0830   CALCIUM 9.3 08/14/2017 0837   ALKPHOS 99 06/29/2020 0830   ALKPHOS 107 08/14/2017 0837   AST 13 (L) 06/29/2020 0830   AST 17 12/02/2019 0753   AST 12 08/14/2017 0837   ALT 14 06/29/2020 0830   ALT 16 12/02/2019 0753   ALT 12 08/14/2017 0837   BILITOT 0.5 06/29/2020 0830   BILITOT 0.4 12/02/2019 0753   BILITOT 0.37 08/14/2017 0837       RADIOGRAPHIC STUDIES: MR Brain W Wo Contrast  Result Date: 07/09/2020 CLINICAL DATA:  Metastatic lung cancer.  Follow-up treated lesion EXAM: MRI HEAD WITHOUT AND WITH CONTRAST TECHNIQUE: Multiplanar, multiecho pulse sequences of the brain and surrounding structures were obtained without and with intravenous contrast. CONTRAST:  69m MULTIHANCE GADOBENATE DIMEGLUMINE 529 MG/ML IV  SOLN COMPARISON:  MRI head 01/09/2020 FINDINGS: Brain: Treated lesion right parietal lobe is stable. This lesion is associated with mild chronic hemorrhage and residual enhancement which measures 16 x 8 mm. Adjacent white matter edema unchanged. Additional patchy white matter hyperintensity bilaterally compatible chronic microvascular ischemia. No acute infarct. No second mass lesion identified. Vascular: Normal arterial flow voids. Skull and upper cervical spine: No focal skeletal lesion. Sinuses/Orbits: Paranasal sinuses clear. Bilateral cataract extraction. Other: None IMPRESSION: Stable treated lesion right parietal lobe with residual enhancement and edema. No new lesion identified. Electronically Signed   By: CFranchot GalloM.D.   On: 07/09/2020 12:27    ASSESSMENT AND PLAN:  This is a very pleasant 69years old white female with metastatic non-small cell lung cancer, adenocarcinoma status post induction systemic chemotherapy with carboplatin and Alimta with partial response. The patient is currently on maintenance treatment with single agent Alimta status post 106 cycles. The patient continues to tolerate her treatment well with no concerning adverse effects. I recommended for her to proceed with cycle #107 today as planned. For the anxiety, she will continue her current treatment with Xanax. For the recently diagnosed type 2 diabetes mellitus, she will continue her current treatment with Metformin and close monitoring by her primary care physician. I will see her back for follow-up visit in 3 weeks for evaluation before the next cycle of her treatment. The patient voices understanding of current disease status and treatment options and is in agreement with the current care plan. All questions were answered. The patient knows to call the clinic with any problems, questions or concerns. We can certainly see the patient much sooner if necessary.  Disclaimer: This note was dictated with voice  recognition software. Similar sounding words can inadvertently be transcribed and may not be corrected upon review.

## 2020-07-20 NOTE — Patient Instructions (Signed)
[place] Discharge Instructions for Patients Receiving Chemotherapy  Today you received the following chemotherapy agents: pemetrexed.  To help prevent nausea and vomiting after your treatment, we encourage you to take your nausea medication as directed.   If you develop nausea and vomiting that is not controlled by your nausea medication, call the clinic.   BELOW ARE SYMPTOMS THAT SHOULD BE REPORTED IMMEDIATELY:  *FEVER GREATER THAN 100.5 F  *CHILLS WITH OR WITHOUT FEVER  NAUSEA AND VOMITING THAT IS NOT CONTROLLED WITH YOUR NAUSEA MEDICATION  *UNUSUAL SHORTNESS OF BREATH  *UNUSUAL BRUISING OR BLEEDING  TENDERNESS IN MOUTH AND THROAT WITH OR WITHOUT PRESENCE OF ULCERS  *URINARY PROBLEMS  *BOWEL PROBLEMS  UNUSUAL RASH Items with * indicate a potential emergency and should be followed up as soon as possible.  Feel free to call the clinic should you have any questions or concerns. The clinic phone number is [phone].  Please show the [facility] at check-in to the Emergency Department and triage nurse.

## 2020-08-06 NOTE — Progress Notes (Signed)
[facility] OFFICE PROGRESS NOTE  [name], [place] [address]  DIAGNOSIS: Stage IV (T2a, N0, M1b) non-small cell lung cancer, adenocarcinoma with negative EGFR and ALK mutations diagnosed in January 2015 and presented with right upper lobe lung mass in addition to a solitary brain metastasis  PRIOR THERAPY: 1) Status post stereotactic radiotherapy to a solitary right parietal brain lesion under the care of Dr. [name] on 10/16/2013. 2) Status post palliative radiotherapy to the right lung tumor under the care of Dr. [name] completed on 12/05/2013. 3) Systemic chemotherapy with carboplatin for AUC of 5 and Alimta 500 mg/M2 every 3 weeks. First dose Jan 06 2014. Status post 6 cycles.  CURRENT THERAPY: Systemic chemotherapy with maintenance Alimta 500 MG/M2 every 3 weeks, status post107cycles.  INTERVAL HISTORY: Alexis [name] 69 y.o. female returns to the clinic today for a follow up visit. The patient is feeling well today without any concerning complaints. She was recently diagnosed with diabetes and was prescribed metformin. She has lost a few pounds recently intentionally by eating healthier. Otherwise, The patient continues to tolerate treatment withsingle agent [place]ta[place] without any adverse effects except flushing from the steroids. Denies any fevers, chills, or night sweats. Denies any chest pain, cough, or hemoptysis.Denies significant or worsening shortness of breath.Denies any nausea, vomiting,constipation, or diarrhea.Denies any headache or visual changes. The patient is here today for evaluation prior to starting cycle #108  MEDICAL HISTORY: Past Medical History:  Diagnosis Date  . Anxiety   . Anxiety 06/20/2016  . Cervical cancer ([place])   . Diabetes mellitus without complication [facility])    patient states she has type 2  . Encounter for antineoplastic chemotherapy 07/20/2015  . Malignant neoplasm of right upper lobe of lung (HCC)      non small cell lung cancer adenocarcioma with brain meta    ALLERGIES:  is allergic to codeine.  MEDICATIONS:  Current Outpatient Medications  Medication Sig Dispense Refill  . acetaminophen (TYLENOL) 500 MG tablet Take 500 mg by mouth every 6 (six) hours as needed for mild pain or headache. Reported on 11/02/2015    . ALPRAZolam (XANAX) 1 MG tablet SMARTSIG:1 Tablet(s) By Mouth 1 to 3 Times Daily PRN    . Ascorbic Acid (VITAMIN C GUMMIE PO) Take 1 each by mouth every morning. (Patient not taking: Reported on 07/08/2020)    . aspirin EC 81 MG tablet Take 1 tablet (81 mg total) by mouth daily. 150 tablet 2  . ciprofloxacin (CIPRO) 750 MG tablet Take 750 mg by mouth 2 (two) times daily. (Patient not taking: Reported on 07/08/2020)    . [employer] ACID CONTROLLER 10 MG tablet SMARTSIG:1 Tablet(s) By Mouth Every 12 Hours PRN    . dexamethasone (DECADRON) 4 MG tablet TAKE 1 PILL TWICE A DAY THE DAY BEFORE, THE DAY OF, AND THE DAY AFTER CHEMOTHERAPY. 40 tablet 2  . folic acid (FOLVITE) 1 MG tablet TAKE 1 TABLET BY MOUTH EVERY DAY 90 tablet 0  . HYDROcodone-acetaminophen (NORCO/VICODIN) 5-325 MG tablet TAKE 1 TABLET BY MOUTH 3 TIMES A DAY FOR 5 DAYS AS NEEDED FOR PAIN (Patient not taking: Reported on 07/08/2020)    . Multiple Vitamin (MULTIVITAMIN WITH MINERALS) TABS tablet Take 1 tablet by mouth every morning.    . ondansetron (ZOFRAN) 8 MG tablet TAKE 1 TABLET BY MOUTH BEFORE CHEMO 30 tablet 0  . OVER THE COUNTER MEDICATION Take 1 tablet by mouth every morning. (Vitamin A) (Patient not  taking: Reported on 07/08/2020)    . prochlorperazine (COMPAZINE) 10 MG tablet Take 1 tablet (10 mg total) by mouth every 6 (six) hours as needed for nausea or vomiting. (Patient not taking: Reported on 07/08/2020) 60 tablet 0  . rosuvastatin (CRESTOR) 10 MG tablet Take 1 tablet (10 mg total) by mouth daily. (Patient not taking: Reported on 07/08/2020) 30 tablet 12  . senna-docusate (SENOKOT-S) 8.6-50 MG tablet Take 1  tablet by mouth daily. 30 tablet prn  . valACYclovir (VALTREX) 1000 MG tablet Take 1,000 mg by mouth 3 (three) times daily. (Patient not taking: Reported on 07/08/2020)    . Vitamin D, Ergocalciferol, (DRISDOL) 1.25 MG (50000 UNIT) CAPS capsule Take 50,000 Units by mouth once a week.     No current facility-administered medications for this visit.   Facility-Administered Medications Ordered in Other Visits  Medication Dose Route Frequency Provider Last Rate Last Admin  . sodium chloride flush (NS) 0.9 % injection 10 mL  10 mL Intravenous PRN Curt Bears, MD      . sodium chloride flush (NS) 0.9 % injection 10 mL  10 mL Intravenous PRN Curt Bears, MD        SURGICAL HISTORY:  Past Surgical History:  Procedure Laterality Date  . ABDOMINAL HYSTERECTOMY    . COLOSTOMY TAKEDOWN N/A 07/10/2014   Procedure: LAPAROSCOPIC LYSIS OF ADHESIONS (90 MIN) LAPAROSCOPIC ASSISTED COLOSTOMY CLOSURE, RIGID PROCTOSIGMOIDOSCOPY;  Surgeon: Jackolyn Confer, MD;  Location: [facility];  Service: General;  Laterality: N/A;  . LAPAROTOMY N/A 11/03/2013   Procedure: EXPLORATORY LAPAROTOMY, DRAINAGE OF INTRA  ABDOMINAL ABSCESSES, MOBILIZATION OF SPLENIC FLEXURE, SIGMOID COLECTOMY WITH COLOSTOMY;  Surgeon: Odis Hollingshead, MD;  Location: [facility];  Service: General;  Laterality: N/A;  . VIDEO BRONCHOSCOPY Bilateral 08/30/2013   Procedure: VIDEO BRONCHOSCOPY WITH FLUORO;  Surgeon: Tanda Rockers, MD;  Location: [facility];  Service: Cardiopulmonary;  Laterality: Bilateral;    REVIEW OF SYSTEMS:   Review of Systems  Constitutional: Negative for appetite change, chills, fatigue, fever and unexpected weight change.  HENT: Negative for mouth sores, nosebleeds, sore throat and trouble swallowing.  Eyes: Negative for eye problems and icterus.  Respiratory:Negative for cough, shortness of breath, hemoptysis, and wheezing.  Cardiovascular: Negative for chest pain and leg swelling.  Gastrointestinal:Positive for  constipation following chemotherapy.Negative for abdominal pain, diarrhea, nausea and vomiting.  Genitourinary: Negative for bladder incontinence, difficulty urinating, dysuria, frequency and hematuria.  Musculoskeletal: Negative for back pain, gait problem, neck pain and neck stiffness.  Skin: Negative for itching and rash.  Neurological: Negative for dizziness, extremity weakness, gait problem, headaches, light-headedness and seizures.  Hematological: Negative for adenopathy. Does not bruise/bleed easily.  Psychiatric/Behavioral: Negative for confusion, depression and sleep disturbance. The patient is not nervous/anxious.    PHYSICAL EXAMINATION:  Blood pressure 124/69, pulse (!) 103, temperature (!) 97.4 F (36.3 C), temperature source Tympanic, resp. rate 18, height 5' 6"  (1.676 m), weight 183 lb 11.2 oz (83.3 kg), SpO2 99 %.  ECOG PERFORMANCE STATUS: 1 - Symptomatic but completely ambulatory  Physical Exam  Constitutional: Oriented to person, place, and time and well-developed, well-nourished, and in no distress. No distress.  HENT:  Head: Normocephalic and atraumatic.  Mouth/Throat: Oropharynx is clear and moist. No oropharyngeal exudate.  Eyes: Conjunctivae are normal. Right eye exhibits no discharge. Left eye exhibits no discharge. No scleral icterus.  Neck: Normal range of motion. Neck supple.  Cardiovascular: Normal rate, regular rhythm, normal heart sounds and intact distal pulses.   Pulmonary/Chest: Effort normal  and breath sounds normal. No respiratory distress. No wheezes. No rales.  Abdominal: Soft. Bowel sounds are normal. Exhibits no distension and no mass. There is no tenderness.  Musculoskeletal: Normal range of motion. Exhibits no edema.  Lymphadenopathy:    No cervical adenopathy.  Neurological: Alert and oriented to person, place, and time. Exhibits normal muscle tone. Gait normal. Coordination normal.  Skin: Skin is warm and dry. No rash noted. Not diaphoretic.  No erythema. No pallor.  Psychiatric: Mood, memory and judgment normal.  Vitals reviewed.  LABORATORY DATA: Lab Results  Component Value Date   WBC 8.9 08/10/2020   HGB 12.6 08/10/2020   HCT 39.9 08/10/2020   MCV 100.8 (H) 08/10/2020   PLT 360 08/10/2020      Chemistry      Component Value Date/Time   NA 140 07/20/2020 0834   NA 139 08/14/2017 0837   K 4.1 07/20/2020 0834   K 4.4 08/14/2017 0837   CL 107 07/20/2020 0834   CO2 22 07/20/2020 0834   CO2 24 08/14/2017 0837   BUN 13 07/20/2020 0834   BUN 13.3 08/14/2017 0837   CREATININE 0.83 07/20/2020 0834   CREATININE 0.75 12/02/2019 0753   CREATININE 0.8 08/14/2017 0837      Component Value Date/Time   CALCIUM 9.3 07/20/2020 0834   CALCIUM 9.3 08/14/2017 0837   ALKPHOS 75 07/20/2020 0834   ALKPHOS 107 08/14/2017 0837   AST 14 (L) 07/20/2020 0834   AST 17 12/02/2019 0753   AST 12 08/14/2017 0837   ALT 18 07/20/2020 0834   ALT 16 12/02/2019 0753   ALT 12 08/14/2017 0837   BILITOT 0.4 07/20/2020 0834   BILITOT 0.4 12/02/2019 0753   BILITOT 0.37 08/14/2017 0837       RADIOGRAPHIC STUDIES:  No results found.   ASSESSMENT/PLAN:  This isa very pleasant 69 year old Caucasian female with stage IV non-small cell lung cancer, adenocarcinoma who presented with a right upper lobe lungmass in addition to a solitary brain metastatsis. She was diagnosed in January 2015. The patient had completed induction systemic chemotherapy with carboplatin and Alimta with a partial response. The patient is currently being treated with single agentmaintenanceAlimta. She is status post107cycles.   Labs were reviewed. Recommend that she proceedwith cycle #108 today as scheduled.  I will arrange for a restaging CT scan of the chest, abdomen, and pelvis prior to starting her next cycle of treatment.   We will see her back for a follow up visit in 3 weeks for evaluation and to review her scan results before starting cycle #109.    The patient was advised to call immediately if she has any concerning symptoms in the interval. The patient voices understanding of current disease status and treatment options and is in agreement with the current care plan. All questions were answered. The patient knows to call the clinic with any problems, questions or concerns. We can certainly see the patient much sooner if necessary    Orders Placed This Encounter  Procedures  . CT Chest W Contrast    Please schedule for 08/27/20    Standing Status:   Future    Standing Expiration Date:   08/10/2021    Scheduling Instructions:     Please schedule for 08/27/20    Order Specific Question:   If indicated for the ordered procedure, I authorize the administration of contrast media per Radiology protocol    Answer:   Yes    Order Specific Question:   Preferred  imaging location?    Answer:   [facility]  . CT Abdomen Pelvis W Contrast    Please schedule for 08/27/20    Standing Status:   Future    Standing Expiration Date:   08/10/2021    Scheduling Instructions:     Please schedule for 08/27/20    Order Specific Question:   If indicated for the ordered procedure, I authorize the administration of contrast media per Radiology protocol    Answer:   Yes    Order Specific Question:   Preferred imaging location?    Answer:   [id number]    Order Specific Question:   Is Oral Contrast requested for this exam?    Answer:   Yes, Per Radiology protocol     [place], PA-C 08/10/20

## 2020-08-10 ENCOUNTER — Inpatient Hospital Stay: Payer: Medicare Other

## 2020-08-10 ENCOUNTER — Inpatient Hospital Stay: Payer: Medicare Other | Attending: Internal Medicine | Admitting: Physician Assistant

## 2020-08-10 ENCOUNTER — Other Ambulatory Visit: Payer: Self-pay

## 2020-08-10 VITALS — BP 124/69 | HR 103 | Temp 97.4°F | Resp 18 | Ht 66.0 in | Wt 183.7 lb

## 2020-08-10 DIAGNOSIS — C3411 Malignant neoplasm of upper lobe, right bronchus or lung: Secondary | ICD-10-CM

## 2020-08-10 DIAGNOSIS — C7931 Secondary malignant neoplasm of brain: Secondary | ICD-10-CM | POA: Insufficient documentation

## 2020-08-10 DIAGNOSIS — Z5111 Encounter for antineoplastic chemotherapy: Secondary | ICD-10-CM | POA: Diagnosis not present

## 2020-08-10 DIAGNOSIS — Z5112 Encounter for antineoplastic immunotherapy: Secondary | ICD-10-CM | POA: Insufficient documentation

## 2020-08-10 DIAGNOSIS — Z79899 Other long term (current) drug therapy: Secondary | ICD-10-CM | POA: Diagnosis not present

## 2020-08-10 DIAGNOSIS — Z923 Personal history of irradiation: Secondary | ICD-10-CM | POA: Insufficient documentation

## 2020-08-10 DIAGNOSIS — Z9221 Personal history of antineoplastic chemotherapy: Secondary | ICD-10-CM | POA: Insufficient documentation

## 2020-08-10 LAB — CBC WITH DIFFERENTIAL/PLATELET
Abs Immature Granulocytes: 0.05 10*3/uL (ref 0.00–0.07)
Basophils Absolute: 0 10*3/uL (ref 0.0–0.1)
Basophils Relative: 0 %
Eosinophils Absolute: 0 10*3/uL (ref 0.0–0.5)
Eosinophils Relative: 0 %
HCT: 39.9 % (ref 36.0–46.0)
Hemoglobin: 12.6 g/dL (ref 12.0–15.0)
Immature Granulocytes: 1 %
Lymphocytes Relative: 5 %
Lymphs Abs: 0.5 10*3/uL — ABNORMAL LOW (ref 0.7–4.0)
MCH: 31.8 pg (ref 26.0–34.0)
MCHC: 31.6 g/dL (ref 30.0–36.0)
MCV: 100.8 fL — ABNORMAL HIGH (ref 80.0–100.0)
Monocytes Absolute: 0.4 10*3/uL (ref 0.1–1.0)
Monocytes Relative: 5 %
Neutro Abs: 8 10*3/uL — ABNORMAL HIGH (ref 1.7–7.7)
Neutrophils Relative %: 89 %
Platelets: 360 10*3/uL (ref 150–400)
RBC: 3.96 MIL/uL (ref 3.87–5.11)
RDW: 15.2 % (ref 11.5–15.5)
WBC: 8.9 10*3/uL (ref 4.0–10.5)
nRBC: 0 % (ref 0.0–0.2)

## 2020-08-10 LAB — COMPREHENSIVE METABOLIC PANEL
ALT: 25 U/L (ref 0–44)
AST: 17 U/L (ref 15–41)
Albumin: 3.6 g/dL (ref 3.5–5.0)
Alkaline Phosphatase: 73 U/L (ref 38–126)
Anion gap: 9 (ref 5–15)
BUN: 13 mg/dL (ref 8–23)
CO2: 24 mmol/L (ref 22–32)
Calcium: 9.8 mg/dL (ref 8.9–10.3)
Chloride: 108 mmol/L (ref 98–111)
Creatinine, Ser: 0.84 mg/dL (ref 0.44–1.00)
GFR, Estimated: >60 mL/min (ref >60)
Glucose, Bld: 160 mg/dL — ABNORMAL HIGH (ref 70–99)
Potassium: 4.2 mmol/L (ref 3.5–5.1)
Sodium: 141 mmol/L (ref 135–145)
Total Bilirubin: 0.4 mg/dL (ref 0.3–1.2)
Total Protein: 7.1 g/dL (ref 6.5–8.1)

## 2020-08-10 MED ORDER — SODIUM CHLORIDE 0.9 % IV SOLN
Freq: Once | INTRAVENOUS | Status: AC
  Filled 2020-08-10: qty 250

## 2020-08-10 MED ORDER — CYANOCOBALAMIN 1000 MCG/ML IJ SOLN: 1000.0000 ug | Freq: Once | INTRAMUSCULAR | Status: DC

## 2020-08-10 MED ORDER — CYANOCOBALAMIN 1000 MCG/ML IJ SOLN
INTRAMUSCULAR | Status: AC
  Filled 2020-08-10: qty 1

## 2020-08-10 MED ORDER — SODIUM CHLORIDE 0.9 % IV SOLN
500.0000 mg/m2 | Freq: Once | INTRAVENOUS | Status: AC
  Administered 2020-08-10: 900 mg via INTRAVENOUS
  Filled 2020-08-10: qty 16

## 2020-08-10 MED ORDER — SODIUM CHLORIDE 0.9 % IV SOLN
10.0000 mg | Freq: Once | INTRAVENOUS | Status: AC
  Administered 2020-08-10: 10 mg via INTRAVENOUS
  Filled 2020-08-10: qty 10

## 2020-08-10 NOTE — Patient Instructions (Signed)
[facility] Discharge Instructions for Patients Receiving Chemotherapy  Today you received the following chemotherapy agents: pemetrexed.  To help prevent nausea and vomiting after your treatment, we encourage you to take your nausea medication as directed.   If you develop nausea and vomiting that is not controlled by your nausea medication, call the clinic.   BELOW ARE SYMPTOMS THAT SHOULD BE REPORTED IMMEDIATELY:  *FEVER GREATER THAN 100.5 F  *CHILLS WITH OR WITHOUT FEVER  NAUSEA AND VOMITING THAT IS NOT CONTROLLED WITH YOUR NAUSEA MEDICATION  *UNUSUAL SHORTNESS OF BREATH  *UNUSUAL BRUISING OR BLEEDING  TENDERNESS IN MOUTH AND THROAT WITH OR WITHOUT PRESENCE OF ULCERS  *URINARY PROBLEMS  *BOWEL PROBLEMS  UNUSUAL RASH Items with * indicate a potential emergency and should be followed up as soon as possible.  Feel free to call the clinic should you have any questions or concerns. The clinic phone number is [phone].  Please show the [facility] at check-in to the Emergency Department and triage nurse.

## 2020-08-10 NOTE — Progress Notes (Signed)
OK to treat with elevated HR per Cassie Heilingoetter, PA   Pt stable at discharge.  No complaints.  Tolerated treatment well.  Ambulatory to lobby.

## 2020-08-11 ENCOUNTER — Telehealth: Payer: Self-pay | Admitting: Physician Assistant

## 2020-08-11 NOTE — Telephone Encounter (Signed)
Scheduled per los. Called and spoke with patient. Confirmed all added appts

## 2020-08-27 ENCOUNTER — Ambulatory Visit
Admission: RE | Admit: 2020-08-27 | Discharge: 2020-08-27 | Disposition: A | Discharge status: Home / Self Care | Payer: Medicare Other | Source: Ambulatory Visit | Attending: Physician Assistant | Admitting: Physician Assistant

## 2020-08-27 DIAGNOSIS — I2510 Atherosclerotic heart disease of native coronary artery without angina pectoris: Secondary | ICD-10-CM | POA: Diagnosis not present

## 2020-08-27 DIAGNOSIS — C3411 Malignant neoplasm of upper lobe, right bronchus or lung: Secondary | ICD-10-CM

## 2020-08-27 DIAGNOSIS — J439 Emphysema, unspecified: Secondary | ICD-10-CM | POA: Diagnosis not present

## 2020-08-27 DIAGNOSIS — K439 Ventral hernia without obstruction or gangrene: Secondary | ICD-10-CM | POA: Diagnosis not present

## 2020-08-27 DIAGNOSIS — I708 Atherosclerosis of other arteries: Secondary | ICD-10-CM | POA: Diagnosis not present

## 2020-08-27 DIAGNOSIS — K469 Unspecified abdominal hernia without obstruction or gangrene: Secondary | ICD-10-CM | POA: Diagnosis not present

## 2020-08-27 DIAGNOSIS — I700 Atherosclerosis of aorta: Secondary | ICD-10-CM | POA: Diagnosis not present

## 2020-08-27 DIAGNOSIS — C3490 Malignant neoplasm of unspecified part of unspecified bronchus or lung: Secondary | ICD-10-CM | POA: Diagnosis not present

## 2020-08-27 DIAGNOSIS — J984 Other disorders of lung: Secondary | ICD-10-CM | POA: Diagnosis not present

## 2020-08-27 MED ORDER — IOPAMIDOL (ISOVUE-300) INJECTION 61%
100.0000 mL | Freq: Once | INTRAVENOUS | Status: AC | PRN
  Administered 2020-08-27: 100 mL via INTRAVENOUS

## 2020-08-31 ENCOUNTER — Inpatient Hospital Stay: Payer: Medicare Other

## 2020-08-31 ENCOUNTER — Other Ambulatory Visit: Payer: Self-pay

## 2020-08-31 ENCOUNTER — Inpatient Hospital Stay: Payer: Medicare Other | Attending: Internal Medicine | Admitting: Internal Medicine

## 2020-08-31 VITALS — BP 139/61 | HR 101 | Temp 97.8°F | Resp 15 | Ht 66.0 in | Wt 181.6 lb

## 2020-08-31 VITALS — HR 100

## 2020-08-31 DIAGNOSIS — Z923 Personal history of irradiation: Secondary | ICD-10-CM | POA: Insufficient documentation

## 2020-08-31 DIAGNOSIS — Z5112 Encounter for antineoplastic immunotherapy: Secondary | ICD-10-CM | POA: Insufficient documentation

## 2020-08-31 DIAGNOSIS — Z79899 Other long term (current) drug therapy: Secondary | ICD-10-CM | POA: Diagnosis not present

## 2020-08-31 DIAGNOSIS — C7931 Secondary malignant neoplasm of brain: Secondary | ICD-10-CM | POA: Insufficient documentation

## 2020-08-31 DIAGNOSIS — C3411 Malignant neoplasm of upper lobe, right bronchus or lung: Secondary | ICD-10-CM

## 2020-08-31 DIAGNOSIS — Z5111 Encounter for antineoplastic chemotherapy: Secondary | ICD-10-CM

## 2020-08-31 DIAGNOSIS — Z9221 Personal history of antineoplastic chemotherapy: Secondary | ICD-10-CM | POA: Diagnosis not present

## 2020-08-31 LAB — CBC WITH DIFFERENTIAL/PLATELET
Abs Immature Granulocytes: 0.04 10*3/uL (ref 0.00–0.07)
Basophils Absolute: 0 10*3/uL (ref 0.0–0.1)
Basophils Relative: 0 %
Eosinophils Absolute: 0 10*3/uL (ref 0.0–0.5)
Eosinophils Relative: 0 %
HCT: 38.2 % (ref 36.0–46.0)
Hemoglobin: 12.3 g/dL (ref 12.0–15.0)
Immature Granulocytes: 0 %
Lymphocytes Relative: 5 %
Lymphs Abs: 0.5 10*3/uL — ABNORMAL LOW (ref 0.7–4.0)
MCH: 31.9 pg (ref 26.0–34.0)
MCHC: 32.2 g/dL (ref 30.0–36.0)
MCV: 99 fL (ref 80.0–100.0)
Monocytes Absolute: 0.6 10*3/uL (ref 0.1–1.0)
Monocytes Relative: 6 %
Neutro Abs: 8.4 10*3/uL — ABNORMAL HIGH (ref 1.7–7.7)
Neutrophils Relative %: 89 %
Platelets: 328 10*3/uL (ref 150–400)
RBC: 3.86 MIL/uL — ABNORMAL LOW (ref 3.87–5.11)
RDW: 15.9 % — ABNORMAL HIGH (ref 11.5–15.5)
WBC: 9.5 10*3/uL (ref 4.0–10.5)
nRBC: 0 % (ref 0.0–0.2)

## 2020-08-31 LAB — COMPREHENSIVE METABOLIC PANEL
ALT: 24 U/L (ref 0–44)
AST: 18 U/L (ref 15–41)
Albumin: 3.6 g/dL (ref 3.5–5.0)
Alkaline Phosphatase: 66 U/L (ref 38–126)
Anion gap: 9 (ref 5–15)
BUN: 14 mg/dL (ref 8–23)
CO2: 24 mmol/L (ref 22–32)
Calcium: 9.7 mg/dL (ref 8.9–10.3)
Chloride: 109 mmol/L (ref 98–111)
Creatinine, Ser: 0.8 mg/dL (ref 0.44–1.00)
GFR, Estimated: >60 mL/min (ref >60)
Glucose, Bld: 148 mg/dL — ABNORMAL HIGH (ref 70–99)
Potassium: 4 mmol/L (ref 3.5–5.1)
Sodium: 142 mmol/L (ref 135–145)
Total Bilirubin: 0.5 mg/dL (ref 0.3–1.2)
Total Protein: 6.9 g/dL (ref 6.5–8.1)

## 2020-08-31 MED ORDER — CYANOCOBALAMIN 1000 MCG/ML IJ SOLN: 1000.0000 ug | Freq: Once | INTRAMUSCULAR | Status: DC

## 2020-08-31 MED ORDER — SODIUM CHLORIDE 0.9 % IV SOLN
10.0000 mg | Freq: Once | INTRAVENOUS | Status: AC
  Administered 2020-08-31: 10 mg via INTRAVENOUS
  Filled 2020-08-31: qty 10

## 2020-08-31 MED ORDER — SODIUM CHLORIDE 0.9 % IV SOLN
500.0000 mg/m2 | Freq: Once | INTRAVENOUS | Status: AC
  Administered 2020-08-31: 900 mg via INTRAVENOUS
  Filled 2020-08-31: qty 20

## 2020-08-31 MED ORDER — CYANOCOBALAMIN 1000 MCG/ML IJ SOLN
INTRAMUSCULAR | Status: AC
  Filled 2020-08-31: qty 1

## 2020-08-31 MED ORDER — SODIUM CHLORIDE 0.9 % IV SOLN
Freq: Once | INTRAVENOUS | Status: AC
  Filled 2020-08-31: qty 250

## 2020-08-31 NOTE — Patient Instructions (Signed)
[facility] Discharge Instructions for Patients Receiving Chemotherapy  Today you received the following chemotherapy agent: Pemetrexed (Alimta)  To help prevent nausea and vomiting after your treatment, we encourage you to take your nausea medication as directed by your MD.   If you develop nausea and vomiting that is not controlled by your nausea medication, call the clinic.   BELOW ARE SYMPTOMS THAT SHOULD BE REPORTED IMMEDIATELY:  *FEVER GREATER THAN 100.5 F  *CHILLS WITH OR WITHOUT FEVER  NAUSEA AND VOMITING THAT IS NOT CONTROLLED WITH YOUR NAUSEA MEDICATION  *UNUSUAL SHORTNESS OF BREATH  *UNUSUAL BRUISING OR BLEEDING  TENDERNESS IN MOUTH AND THROAT WITH OR WITHOUT PRESENCE OF ULCERS  *URINARY PROBLEMS  *BOWEL PROBLEMS  UNUSUAL RASH Items with * indicate a potential emergency and should be followed up as soon as possible.  Feel free to call the clinic should you have any questions or concerns. The clinic phone number is [phone].  Please show the [facility] at check-in to the Emergency Department and triage nurse.

## 2020-08-31 NOTE — Progress Notes (Signed)
Alexis [name] [phone]   [phone]  OFFICE PROGRESS NOTE  Alexis [name], [place] [address]  DIAGNOSIS: Stage IV (T2a, N0, M1b) non-small cell lung cancer, adenocarcinoma with negative EGFR and ALK mutations diagnosed in January 2015 and presented with right upper lobe lung mass in addition to a solitary brain metastasis.  PRIOR THERAPY: 1) Status post stereotactic radiotherapy to a solitary right parietal brain lesion under the care of Dr. [name] on 10/16/2013. 2) Status post palliative radiotherapy to the right lung tumor under the care of Dr. [name] completed on 12/05/2013. 3) Systemic chemotherapy with carboplatin for AUC of 5 and Alimta 500 mg/M2 every 3 weeks. First dose Jan 06 2014. Status post 6 cycles.  CURRENT THERAPY: Systemic chemotherapy with maintenance Alimta 500 MG/M2 every 3 weeks, status post 108  cycles.  INTERVAL HISTORY: Alexis [name] 70 y.o. female returns to the clinic today for follow-up visit accompanied by her husband.  The patient is feeling fine today with no concerning complaints.  She denied having any current chest pain, shortness of breath, cough or hemoptysis.  She denied having any fever or chills.  She has no nausea, vomiting, diarrhea or constipation.  She has no headache or visual changes.  She continues to tolerate her maintenance Alimta fairly well.  She had repeat CT scan of the chest, abdomen pelvis performed recently and she is here for evaluation and discussion of her scan results.  MEDICAL HISTORY: Past Medical History:  Diagnosis Date  . Anxiety   . Anxiety 06/20/2016  . Cervical cancer ([place])   . Diabetes mellitus without complication [facility])    patient states she has type 2  . Encounter for antineoplastic chemotherapy 07/20/2015  . Malignant neoplasm of right upper lobe of lung ([facility])     non small cell lung cancer adenocarcioma with brain meta    ALLERGIES:  is allergic to  codeine.  MEDICATIONS:  Current Outpatient Medications  Medication Sig Dispense Refill  . acetaminophen (TYLENOL) 500 MG tablet Take 500 mg by mouth every 6 (six) hours as needed for mild pain or headache. Reported on 11/02/2015    . ALPRAZolam (XANAX) 1 MG tablet SMARTSIG:1 Tablet(s) By Mouth 1 to 3 Times Daily PRN    . Ascorbic Acid (VITAMIN C GUMMIE PO) Take 1 each by mouth every morning. (Patient not taking: Reported on 07/08/2020)    . aspirin EC 81 MG tablet Take 1 tablet (81 mg total) by mouth daily. 150 tablet 2  . ciprofloxacin (CIPRO) 750 MG tablet Take 750 mg by mouth 2 (two) times daily. (Patient not taking: Reported on 07/08/2020)    . [employer] ACID CONTROLLER 10 MG tablet SMARTSIG:1 Tablet(s) By Mouth Every 12 Hours PRN    . dexamethasone (DECADRON) 4 MG tablet TAKE 1 PILL TWICE A DAY THE DAY BEFORE, THE DAY OF, AND THE DAY AFTER CHEMOTHERAPY. 40 tablet 2  . folic acid (FOLVITE) 1 MG tablet TAKE 1 TABLET BY MOUTH EVERY DAY 90 tablet 0  . HYDROcodone-acetaminophen (NORCO/VICODIN) 5-325 MG tablet TAKE 1 TABLET BY MOUTH 3 TIMES A DAY FOR 5 DAYS AS NEEDED FOR PAIN (Patient not taking: Reported on 07/08/2020)    . Multiple Vitamin (MULTIVITAMIN WITH MINERALS) TABS tablet Take 1 tablet by mouth every morning.    . ondansetron (ZOFRAN) 8 MG tablet TAKE 1 TABLET BY MOUTH BEFORE CHEMO 30 tablet 0  . OVER THE COUNTER MEDICATION Take 1  tablet by mouth every morning. (Vitamin A) (Patient not taking: Reported on 07/08/2020)    . prochlorperazine (COMPAZINE) 10 MG tablet Take 1 tablet (10 mg total) by mouth every 6 (six) hours as needed for nausea or vomiting. (Patient not taking: Reported on 07/08/2020) 60 tablet 0  . rosuvastatin (CRESTOR) 10 MG tablet Take 1 tablet (10 mg total) by mouth daily. (Patient not taking: Reported on 07/08/2020) 30 tablet 12  . senna-docusate (SENOKOT-S) 8.6-50 MG tablet Take 1 tablet by mouth daily. 30 tablet prn  . valACYclovir (VALTREX) 1000 MG tablet Take 1,000 mg by  mouth 3 (three) times daily. (Patient not taking: Reported on 07/08/2020)    . Vitamin D, Ergocalciferol, (DRISDOL) 1.25 MG (50000 UNIT) CAPS capsule Take 50,000 Units by mouth once a week.     No current facility-administered medications for this visit.   Facility-Administered Medications Ordered in Other Visits  Medication Dose Route Frequency Provider Last Rate Last Admin  . sodium chloride flush (NS) 0.9 % injection 10 mL  10 mL Intravenous PRN Curt Bears, MD      . sodium chloride flush (NS) 0.9 % injection 10 mL  10 mL Intravenous PRN Curt Bears, MD        SURGICAL HISTORY:  Past Surgical History:  Procedure Laterality Date  . ABDOMINAL HYSTERECTOMY    . COLOSTOMY TAKEDOWN N/A 07/10/2014   Procedure: LAPAROSCOPIC LYSIS OF ADHESIONS (90 MIN) LAPAROSCOPIC ASSISTED COLOSTOMY CLOSURE, RIGID PROCTOSIGMOIDOSCOPY;  Surgeon: Jackolyn Confer, MD;  Location: [facility];  Service: General;  Laterality: N/A;  . LAPAROTOMY N/A 11/03/2013   Procedure: EXPLORATORY LAPAROTOMY, DRAINAGE OF INTRA  ABDOMINAL ABSCESSES, MOBILIZATION OF SPLENIC FLEXURE, SIGMOID COLECTOMY WITH COLOSTOMY;  Surgeon: Odis Hollingshead, MD;  Location: [facility];  Service: General;  Laterality: N/A;  . VIDEO BRONCHOSCOPY Bilateral 08/30/2013   Procedure: VIDEO BRONCHOSCOPY WITH FLUORO;  Surgeon: Tanda Rockers, MD;  Location: [facility];  Service: Cardiopulmonary;  Laterality: Bilateral;    REVIEW OF SYSTEMS:  Constitutional: positive for fatigue Eyes: negative Ears, nose, mouth, throat, and face: negative Respiratory: negative Cardiovascular: negative Gastrointestinal: negative Genitourinary:negative Integument/breast: negative Hematologic/lymphatic: negative Musculoskeletal:negative Neurological: negative Behavioral/Psych: negative Endocrine: negative Allergic/Immunologic: negative   PHYSICAL EXAMINATION: General appearance: alert, cooperative and no distress Head: Normocephalic, without obvious abnormality,  atraumatic Neck: no adenopathy, no JVD, supple, symmetrical, trachea midline and thyroid not enlarged, symmetric, no tenderness/mass/nodules Lymph nodes: Cervical, supraclavicular, and axillary nodes normal. Resp: clear to auscultation bilaterally Back: symmetric, no curvature. ROM normal. No CVA tenderness. Cardio: regular rate and rhythm, S1, S2 normal, no murmur, click, rub or gallop GI: soft, non-tender; bowel sounds normal; no masses,  no organomegaly Extremities: extremities normal, atraumatic, no cyanosis or edema Neurologic: Alert and oriented X 3, normal strength and tone. Normal symmetric reflexes. Normal coordination and gait   ECOG PERFORMANCE STATUS: 1 - Symptomatic but completely ambulatory  Blood pressure 139/61, pulse (!) 101, temperature 97.8 F (36.6 C), temperature source Tympanic, resp. rate 15, height $RemoveBe'5\' 6"'EfcHGWXOA$  (1.676 m), weight 181 lb 9.6 oz (82.4 kg), SpO2 99 %.  LABORATORY DATA: Lab Results  Component Value Date   WBC 8.9 08/10/2020   HGB 12.6 08/10/2020   HCT 39.9 08/10/2020   MCV 100.8 (H) 08/10/2020   PLT 360 08/10/2020      Chemistry      Component Value Date/Time   NA 141 08/10/2020 0857   NA 139 08/14/2017 0837   K 4.2 08/10/2020 0857   K 4.4 08/14/2017 0837   CL  108 08/10/2020 0857   CO2 24 08/10/2020 0857   CO2 24 08/14/2017 0837   BUN 13 08/10/2020 0857   BUN 13.3 08/14/2017 0837   CREATININE 0.84 08/10/2020 0857   CREATININE 0.75 12/02/2019 0753   CREATININE 0.8 08/14/2017 0837      Component Value Date/Time   CALCIUM 9.8 08/10/2020 0857   CALCIUM 9.3 08/14/2017 0837   ALKPHOS 73 08/10/2020 0857   ALKPHOS 107 08/14/2017 0837   AST 17 08/10/2020 0857   AST 17 12/02/2019 0753   AST 12 08/14/2017 0837   ALT 25 08/10/2020 0857   ALT 16 12/02/2019 0753   ALT 12 08/14/2017 0837   BILITOT 0.4 08/10/2020 0857   BILITOT 0.4 12/02/2019 0753   BILITOT 0.37 08/14/2017 0837       RADIOGRAPHIC STUDIES: CT Chest W Contrast  Result Date:  08/28/2020 CLINICAL DATA:  Restaging metastatic non-small cell lung cancer. EXAM: CT CHEST, ABDOMEN, AND PELVIS WITH CONTRAST TECHNIQUE: Multidetector CT imaging of the chest, abdomen and pelvis was performed following the standard protocol during bolus administration of intravenous contrast. CONTRAST:  178mL ISOVUE-300 IOPAMIDOL (ISOVUE-300) INJECTION 61% COMPARISON:  CT scan 06/04/2020 FINDINGS: CT CHEST FINDINGS Cardiovascular: The heart is normal in size. No pericardial effusion. Mild tortuosity and moderate atherosclerotic calcification involving the thoracic aorta. No focal aneurysm or dissection. Stable scattered coronary artery calcifications. The pulmonary arteries are unremarkable. Mediastinum/Nodes: Small scattered mediastinal and hilar lymph nodes. These all measure less than 8 mm. No new or progressive findings. The esophagus is grossly normal. The thyroid gland is unremarkable. Lungs/Pleura: Stable right apical post treatment changes, likely radiation fibrosis and associated volume loss. No findings suspicious for recurrent tumor. Stable underlying emphysematous changes and areas of pulmonary scarring. No worrisome pulmonary lesions or pulmonary nodules to suggest metastatic disease. 7 mm ground-glass nodule in the right lower lobe on image number 96/4 is stable. No pleural effusions or pleural nodules. The central tracheobronchial tree is unremarkable. Musculoskeletal: No breast masses, supraclavicular or axillary adenopathy. The bony thorax is intact.  No worrisome bone lesions or fractures. CT ABDOMEN PELVIS FINDINGS Hepatobiliary: No hepatic lesions to suggest metastatic disease. The gallbladder is unremarkable. No biliary dilatation. Pancreas: No mass, inflammation or ductal dilatation. Spleen: Normal size.  No focal lesions. Adrenals/Urinary Tract: Adrenal glands are normal. No renal, ureteral or bladder calculi or mass. Stomach/Bowel: The stomach, duodenum, small bowel and colon unremarkable.  No acute inflammatory changes, mass lesions or obstructive findings. Stable surgical changes involving the lower sigmoid colon. Vascular/Lymphatic: Stable atherosclerotic calcifications involving the aorta and iliac arteries. The branch vessels are patent. The major venous structures are patent. No mesenteric or retroperitoneal mass or adenopathy. Reproductive: Surgically absent. Other: No pelvic mass or adenopathy. No free pelvic fluid collections. No inguinal mass or adenopathy. Stable small anterior abdominal wall hernias containing fat. Musculoskeletal: No significant bony findings. No worrisome bone lesions or fractures. IMPRESSION: 1. Stable right apical post treatment changes. No findings suspicious for recurrent tumor. 2. Stable 7 mm ground-glass nodule in the right lower lobe. Attention on future studies is suggested. 3. Stable emphysematous changes and pulmonary scarring. 4. No findings for abdominal/pelvic metastatic disease. 5. Stable small anterior abdominal wall hernias containing fat. 6. Emphysema and aortic atherosclerosis. Aortic Atherosclerosis (ICD10-I70.0) and Emphysema (ICD10-J43.9). Electronically Signed   By: Marijo Sanes M.D.   On: 08/28/2020 08:40   CT Abdomen Pelvis W Contrast  Result Date: 08/28/2020 CLINICAL DATA:  Restaging metastatic non-small cell lung cancer. EXAM: CT CHEST, ABDOMEN, AND  PELVIS WITH CONTRAST TECHNIQUE: Multidetector CT imaging of the chest, abdomen and pelvis was performed following the standard protocol during bolus administration of intravenous contrast. CONTRAST:  166m ISOVUE-300 IOPAMIDOL (ISOVUE-300) INJECTION 61% COMPARISON:  CT scan 06/04/2020 FINDINGS: CT CHEST FINDINGS Cardiovascular: The heart is normal in size. No pericardial effusion. Mild tortuosity and moderate atherosclerotic calcification involving the thoracic aorta. No focal aneurysm or dissection. Stable scattered coronary artery calcifications. The pulmonary arteries are unremarkable.  Mediastinum/Nodes: Small scattered mediastinal and hilar lymph nodes. These all measure less than 8 mm. No new or progressive findings. The esophagus is grossly normal. The thyroid gland is unremarkable. Lungs/Pleura: Stable right apical post treatment changes, likely radiation fibrosis and associated volume loss. No findings suspicious for recurrent tumor. Stable underlying emphysematous changes and areas of pulmonary scarring. No worrisome pulmonary lesions or pulmonary nodules to suggest metastatic disease. 7 mm ground-glass nodule in the right lower lobe on image number 96/4 is stable. No pleural effusions or pleural nodules. The central tracheobronchial tree is unremarkable. Musculoskeletal: No breast masses, supraclavicular or axillary adenopathy. The bony thorax is intact.  No worrisome bone lesions or fractures. CT ABDOMEN PELVIS FINDINGS Hepatobiliary: No hepatic lesions to suggest metastatic disease. The gallbladder is unremarkable. No biliary dilatation. Pancreas: No mass, inflammation or ductal dilatation. Spleen: Normal size.  No focal lesions. Adrenals/Urinary Tract: Adrenal glands are normal. No renal, ureteral or bladder calculi or mass. Stomach/Bowel: The stomach, duodenum, small bowel and colon unremarkable. No acute inflammatory changes, mass lesions or obstructive findings. Stable surgical changes involving the lower sigmoid colon. Vascular/Lymphatic: Stable atherosclerotic calcifications involving the aorta and iliac arteries. The branch vessels are patent. The major venous structures are patent. No mesenteric or retroperitoneal mass or adenopathy. Reproductive: Surgically absent. Other: No pelvic mass or adenopathy. No free pelvic fluid collections. No inguinal mass or adenopathy. Stable small anterior abdominal wall hernias containing fat. Musculoskeletal: No significant bony findings. No worrisome bone lesions or fractures. IMPRESSION: 1. Stable right apical post treatment changes. No  findings suspicious for recurrent tumor. 2. Stable 7 mm ground-glass nodule in the right lower lobe. Attention on future studies is suggested. 3. Stable emphysematous changes and pulmonary scarring. 4. No findings for abdominal/pelvic metastatic disease. 5. Stable small anterior abdominal wall hernias containing fat. 6. Emphysema and aortic atherosclerosis. Aortic Atherosclerosis (ICD10-I70.0) and Emphysema (ICD10-J43.9). Electronically Signed   By: PMarijo SanesM.D.   On: 08/28/2020 08:40    ASSESSMENT AND PLAN:  This is a very pleasant 70years old white female with metastatic non-small cell lung cancer, adenocarcinoma status post induction systemic chemotherapy with carboplatin and Alimta with partial response. The patient is currently on maintenance treatment with single agent Alimta status post 108 cycles. The patient has been tolerating this treatment well with no concerning adverse effects. She had repeat CT scan of the chest, abdomen pelvis performed recently.  I personally and independently reviewed the scans and discussed the results with the patient and her husband today. Her scan showed no concerning findings for disease progression. I recommended for her to continue her current maintenance treatment with Alimta and she will proceed with cycle #109 today. For the anxiety, the patient will continue her treatment with Xanax on as-needed basis. For the type 2 diabetes mellitus, she is currently on Metformin and followed by her primary care physician. She will come back for follow-up visit in 3 weeks for evaluation before the next cycle of her treatment. The patient was advised to call immediately if she has any  concerning symptoms in the interval.  The patient voices understanding of current disease status and treatment options and is in agreement with the current care plan. All questions were answered. The patient knows to call the clinic with any problems, questions or concerns. We can  certainly see the patient much sooner if necessary.  Disclaimer: This note was dictated with voice recognition software. Similar sounding words can inadvertently be transcribed and may not be corrected upon review.

## 2020-08-31 NOTE — Progress Notes (Signed)
Pt. stable for discharge. Left via ambulation, no respiratory distress noted.

## 2020-09-04 ENCOUNTER — Other Ambulatory Visit: Payer: Self-pay | Admitting: Internal Medicine

## 2020-09-04 DIAGNOSIS — C3490 Malignant neoplasm of unspecified part of unspecified bronchus or lung: Secondary | ICD-10-CM

## 2020-09-21 ENCOUNTER — Inpatient Hospital Stay: Payer: Medicare Other

## 2020-09-21 ENCOUNTER — Inpatient Hospital Stay (HOSPITAL_BASED_OUTPATIENT_CLINIC_OR_DEPARTMENT_OTHER): Payer: Medicare Other | Admitting: Internal Medicine

## 2020-09-21 ENCOUNTER — Encounter: Payer: Self-pay | Admitting: Internal Medicine

## 2020-09-21 ENCOUNTER — Other Ambulatory Visit: Payer: Self-pay

## 2020-09-21 VITALS — BP 133/70 | HR 109 | Temp 97.3°F | Resp 18 | Ht 64.5 in | Wt 178.7 lb

## 2020-09-21 VITALS — HR 98

## 2020-09-21 DIAGNOSIS — Z923 Personal history of irradiation: Secondary | ICD-10-CM | POA: Diagnosis not present

## 2020-09-21 DIAGNOSIS — C3411 Malignant neoplasm of upper lobe, right bronchus or lung: Secondary | ICD-10-CM

## 2020-09-21 DIAGNOSIS — Z9221 Personal history of antineoplastic chemotherapy: Secondary | ICD-10-CM | POA: Diagnosis not present

## 2020-09-21 DIAGNOSIS — Z5111 Encounter for antineoplastic chemotherapy: Secondary | ICD-10-CM

## 2020-09-21 DIAGNOSIS — C7931 Secondary malignant neoplasm of brain: Secondary | ICD-10-CM | POA: Diagnosis not present

## 2020-09-21 DIAGNOSIS — Z5112 Encounter for antineoplastic immunotherapy: Secondary | ICD-10-CM | POA: Diagnosis not present

## 2020-09-21 DIAGNOSIS — Z79899 Other long term (current) drug therapy: Secondary | ICD-10-CM | POA: Diagnosis not present

## 2020-09-21 LAB — CBC WITH DIFFERENTIAL/PLATELET
Abs Immature Granulocytes: 0.02 10*3/uL (ref 0.00–0.07)
Basophils Absolute: 0 10*3/uL (ref 0.0–0.1)
Basophils Relative: 0 %
Eosinophils Absolute: 0 10*3/uL (ref 0.0–0.5)
Eosinophils Relative: 0 %
HCT: 41.7 % (ref 36.0–46.0)
Hemoglobin: 13.3 g/dL (ref 12.0–15.0)
Immature Granulocytes: 0 %
Lymphocytes Relative: 7 %
Lymphs Abs: 0.4 10*3/uL — ABNORMAL LOW (ref 0.7–4.0)
MCH: 32.4 pg (ref 26.0–34.0)
MCHC: 31.9 g/dL (ref 30.0–36.0)
MCV: 101.7 fL — ABNORMAL HIGH (ref 80.0–100.0)
Monocytes Absolute: 0.1 10*3/uL (ref 0.1–1.0)
Monocytes Relative: 2 %
Neutro Abs: 6 10*3/uL (ref 1.7–7.7)
Neutrophils Relative %: 91 %
Platelets: 307 10*3/uL (ref 150–400)
RBC: 4.1 MIL/uL (ref 3.87–5.11)
RDW: 15.4 % (ref 11.5–15.5)
WBC: 6.5 10*3/uL (ref 4.0–10.5)
nRBC: 0 % (ref 0.0–0.2)

## 2020-09-21 LAB — COMPREHENSIVE METABOLIC PANEL
ALT: 15 U/L (ref 0–44)
AST: 13 U/L — ABNORMAL LOW (ref 15–41)
Albumin: 3.6 g/dL (ref 3.5–5.0)
Alkaline Phosphatase: 71 U/L (ref 38–126)
Anion gap: 10 (ref 5–15)
BUN: 22 mg/dL (ref 8–23)
CO2: 22 mmol/L (ref 22–32)
Calcium: 9.3 mg/dL (ref 8.9–10.3)
Chloride: 107 mmol/L (ref 98–111)
Creatinine, Ser: 0.91 mg/dL (ref 0.44–1.00)
GFR, Estimated: >60 mL/min (ref >60)
Glucose, Bld: 195 mg/dL — ABNORMAL HIGH (ref 70–99)
Potassium: 4.3 mmol/L (ref 3.5–5.1)
Sodium: 139 mmol/L (ref 135–145)
Total Bilirubin: 0.5 mg/dL (ref 0.3–1.2)
Total Protein: 7 g/dL (ref 6.5–8.1)

## 2020-09-21 MED ORDER — CYANOCOBALAMIN 1000 MCG/ML IJ SOLN
1000.0000 ug | Freq: Once | INTRAMUSCULAR | Status: AC
  Administered 2020-09-21: 1000 ug via INTRAMUSCULAR

## 2020-09-21 MED ORDER — SODIUM CHLORIDE 0.9 % IV SOLN
Freq: Once | INTRAVENOUS | Status: AC
  Filled 2020-09-21: qty 250

## 2020-09-21 MED ORDER — SODIUM CHLORIDE 0.9 % IV SOLN
500.0000 mg/m2 | Freq: Once | INTRAVENOUS | Status: AC
  Administered 2020-09-21: 900 mg via INTRAVENOUS
  Filled 2020-09-21: qty 20

## 2020-09-21 MED ORDER — CYANOCOBALAMIN 1000 MCG/ML IJ SOLN
INTRAMUSCULAR | Status: AC
  Filled 2020-09-21: qty 1

## 2020-09-21 MED ORDER — SODIUM CHLORIDE 0.9 % IV SOLN
10.0000 mg | Freq: Once | INTRAVENOUS | Status: AC
  Administered 2020-09-21: 10 mg via INTRAVENOUS
  Filled 2020-09-21: qty 10

## 2020-09-21 NOTE — Progress Notes (Signed)
[place] Telephone:([phone]) [phone]   [phone]  OFFICE PROGRESS NOTE  [name], [place] [address]  DIAGNOSIS: Stage IV (T2a, N0, M1b) non-small cell lung cancer, adenocarcinoma with negative EGFR and ALK mutations diagnosed in January 2015 and presented with right upper lobe lung mass in addition to a solitary brain metastasis.  PRIOR THERAPY: 1) Status post stereotactic radiotherapy to a solitary right parietal brain lesion under the care of Dr. [name] on 10/16/2013. 2) Status post palliative radiotherapy to the right lung tumor under the care of Dr. [name] completed on 12/05/2013. 3) Systemic chemotherapy with carboplatin for AUC of 5 and Alimta 500 mg/M2 every 3 weeks. First dose Jan 06 2014. Status post 6 cycles.  CURRENT THERAPY: Systemic chemotherapy with maintenance Alimta 500 MG/M2 every 3 weeks, status post 109 cycles.  INTERVAL HISTORY: Alexis [name] 70 y.o. female returns to the clinic today for follow-up visit accompanied by her husband.  The patient is feeling fine today with no concerning complaints.  She denied having any current chest pain, shortness of breath, cough or hemoptysis.  She denied having any fever or chills.  She has no nausea, vomiting, diarrhea or constipation.  She has no headache or visual changes.  She continues to tolerate her systemic chemotherapy fairly well.  The patient is here today for evaluation before starting cycle #110.  MEDICAL HISTORY: Past Medical History:  Diagnosis Date  . Anxiety   . Anxiety 06/20/2016  . Cervical cancer ([facility])   . Diabetes mellitus without complication [facility])    patient states she has type 2  . Encounter for antineoplastic chemotherapy 07/20/2015  . 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  MEDICATIONS:  Current Outpatient Medications   Medication Sig Dispense Refill  . acetaminophen (TYLENOL) 500 MG tablet Take 500 mg by mouth every 6 (six) hours as needed for mild pain or headache. Reported on 11/02/2015    . ALPRAZolam (XANAX) 1 MG tablet SMARTSIG:1 Tablet(s) By Mouth 1 to 3 Times Daily PRN    . Ascorbic Acid (VITAMIN C GUMMIE PO) Take 1 each by mouth every morning.    Marland Kitchen aspirin EC 81 MG tablet Take 1 tablet (81 mg total) by mouth daily. 150 tablet 2  . ciprofloxacin (CIPRO) 750 MG tablet Take 750 mg by mouth 2 (two) times daily.    . [employer] ACID CONTROLLER 10 MG tablet SMARTSIG:1 Tablet(s) By Mouth Every 12 Hours PRN    . dexamethasone (DECADRON) 4 MG tablet TAKE 1 PILL TWICE A DAY THE DAY BEFORE, THE DAY OF, AND THE DAY AFTER CHEMOTHERAPY. 40 tablet 2  . folic acid (FOLVITE) 1 MG tablet TAKE 1 TABLET BY MOUTH EVERY DAY 90 tablet 0  . HYDROcodone-acetaminophen (NORCO/VICODIN) 5-325 MG tablet TAKE 1 TABLET BY MOUTH 3 TIMES A DAY FOR 5 DAYS AS NEEDED FOR PAIN    . Multiple Vitamin (MULTIVITAMIN WITH MINERALS) TABS tablet Take 1 tablet by mouth every morning.    . ondansetron (ZOFRAN) 8 MG tablet TAKE 1 TABLET BY MOUTH BEFORE CHEMO 30 tablet 0  . OVER THE COUNTER MEDICATION Take 1 tablet by mouth every morning. (Vitamin A)    . prochlorperazine (COMPAZINE) 10 MG tablet Take 1 tablet (10 mg total) by mouth every 6 (six) hours as needed for nausea  or vomiting. 60 tablet 0  . rosuvastatin (CRESTOR) 10 MG tablet Take 1 tablet (10 mg total) by mouth daily. 30 tablet 12  . senna-docusate (SENOKOT-S) 8.6-50 MG tablet Take 1 tablet by mouth daily. 30 tablet prn  . valACYclovir (VALTREX) 1000 MG tablet Take 1,000 mg by mouth 3 (three) times daily.    . Vitamin D, Ergocalciferol, (DRISDOL) 1.25 MG (50000 UNIT) CAPS capsule Take 50,000 Units by mouth once a week.     No current facility-administered medications for this visit.   Facility-Administered Medications Ordered in Other Visits  Medication Dose Route Frequency Provider Last Rate  Last Admin  . sodium chloride flush (NS) 0.9 % injection 10 mL  10 mL Intravenous PRN Curt Bears, MD      . sodium chloride flush (NS) 0.9 % injection 10 mL  10 mL Intravenous PRN Curt Bears, MD        SURGICAL HISTORY:  Past Surgical History:  Procedure Laterality Date  . ABDOMINAL HYSTERECTOMY    . COLOSTOMY TAKEDOWN N/A 07/10/2014   Procedure: LAPAROSCOPIC LYSIS OF ADHESIONS (90 MIN) LAPAROSCOPIC ASSISTED COLOSTOMY CLOSURE, RIGID PROCTOSIGMOIDOSCOPY;  Surgeon: Jackolyn Confer, MD;  Location: [facility];  Service: General;  Laterality: N/A;  . LAPAROTOMY N/A 11/03/2013   Procedure: EXPLORATORY LAPAROTOMY, DRAINAGE OF INTRA  ABDOMINAL ABSCESSES, MOBILIZATION OF SPLENIC FLEXURE, SIGMOID COLECTOMY WITH COLOSTOMY;  Surgeon: Odis Hollingshead, MD;  Location: [facility];  Service: General;  Laterality: N/A;  . VIDEO BRONCHOSCOPY Bilateral 08/30/2013   Procedure: VIDEO BRONCHOSCOPY WITH FLUORO;  Surgeon: Tanda Rockers, MD;  Location: [facility];  Service: Cardiopulmonary;  Laterality: Bilateral;    REVIEW OF SYSTEMS:  A comprehensive review of systems was negative.   PHYSICAL EXAMINATION: General appearance: alert, cooperative and no distress Head: Normocephalic, without obvious abnormality, atraumatic Neck: no adenopathy, no JVD, supple, symmetrical, trachea midline and thyroid not enlarged, symmetric, no tenderness/mass/nodules Lymph nodes: Cervical, supraclavicular, and axillary nodes normal. Resp: clear to auscultation bilaterally Back: symmetric, no curvature. ROM normal. No CVA tenderness. Cardio: regular rate and rhythm, S1, S2 normal, no murmur, click, rub or gallop GI: soft, non-tender; bowel sounds normal; no masses,  no organomegaly Extremities: extremities normal, atraumatic, no cyanosis or edema   ECOG PERFORMANCE STATUS: 1 - Symptomatic but completely ambulatory  Blood pressure 133/70, pulse (!) 109, temperature (!) 97.3 F (36.3 C), resp. rate 18, height 5' 4.5" (1.638  m), weight 178 lb 11.2 oz (81.1 kg), SpO2 99 %.  LABORATORY DATA: Lab Results  Component Value Date   WBC 9.5 08/31/2020   HGB 12.3 08/31/2020   HCT 38.2 08/31/2020   MCV 99.0 08/31/2020   PLT 328 08/31/2020      Chemistry      Component Value Date/Time   NA 142 08/31/2020 0842   NA 139 08/14/2017 0837   K 4.0 08/31/2020 0842   K 4.4 08/14/2017 0837   CL 109 08/31/2020 0842   CO2 24 08/31/2020 0842   CO2 24 08/14/2017 0837   BUN 14 08/31/2020 0842   BUN 13.3 08/14/2017 0837   CREATININE 0.80 08/31/2020 0842   CREATININE 0.75 12/02/2019 0753   CREATININE 0.8 08/14/2017 0837      Component Value Date/Time   CALCIUM 9.7 08/31/2020 0842   CALCIUM 9.3 08/14/2017 0837   ALKPHOS 66 08/31/2020 0842   ALKPHOS 107 08/14/2017 0837   AST 18 08/31/2020 0842   AST 17 12/02/2019 0753   AST 12 08/14/2017 0837   ALT 24 08/31/2020 0842  ALT 16 12/02/2019 0753   ALT 12 08/14/2017 0837   BILITOT 0.5 08/31/2020 0842   BILITOT 0.4 12/02/2019 0753   BILITOT 0.37 08/14/2017 0837       RADIOGRAPHIC STUDIES: CT Chest W Contrast  Result Date: 08/28/2020 CLINICAL DATA:  Restaging metastatic non-small cell lung cancer. EXAM: CT CHEST, ABDOMEN, AND PELVIS WITH CONTRAST TECHNIQUE: Multidetector CT imaging of the chest, abdomen and pelvis was performed following the standard protocol during bolus administration of intravenous contrast. CONTRAST:  115mL ISOVUE-300 IOPAMIDOL (ISOVUE-300) INJECTION 61% COMPARISON:  CT scan 06/04/2020 FINDINGS: CT CHEST FINDINGS Cardiovascular: The heart is normal in size. No pericardial effusion. Mild tortuosity and moderate atherosclerotic calcification involving the thoracic aorta. No focal aneurysm or dissection. Stable scattered coronary artery calcifications. The pulmonary arteries are unremarkable. Mediastinum/Nodes: Small scattered mediastinal and hilar lymph nodes. These all measure less than 8 mm. No new or progressive findings. The esophagus is grossly  normal. The thyroid gland is unremarkable. Lungs/Pleura: Stable right apical post treatment changes, likely radiation fibrosis and associated volume loss. No findings suspicious for recurrent tumor. Stable underlying emphysematous changes and areas of pulmonary scarring. No worrisome pulmonary lesions or pulmonary nodules to suggest metastatic disease. 7 mm ground-glass nodule in the right lower lobe on image number 96/4 is stable. No pleural effusions or pleural nodules. The central tracheobronchial tree is unremarkable. Musculoskeletal: No breast masses, supraclavicular or axillary adenopathy. The bony thorax is intact.  No worrisome bone lesions or fractures. CT ABDOMEN PELVIS FINDINGS Hepatobiliary: No hepatic lesions to suggest metastatic disease. The gallbladder is unremarkable. No biliary dilatation. Pancreas: No mass, inflammation or ductal dilatation. Spleen: Normal size.  No focal lesions. Adrenals/Urinary Tract: Adrenal glands are normal. No renal, ureteral or bladder calculi or mass. Stomach/Bowel: The stomach, duodenum, small bowel and colon unremarkable. No acute inflammatory changes, mass lesions or obstructive findings. Stable surgical changes involving the lower sigmoid colon. Vascular/Lymphatic: Stable atherosclerotic calcifications involving the aorta and iliac arteries. The branch vessels are patent. The major venous structures are patent. No mesenteric or retroperitoneal mass or adenopathy. Reproductive: Surgically absent. Other: No pelvic mass or adenopathy. No free pelvic fluid collections. No inguinal mass or adenopathy. Stable small anterior abdominal wall hernias containing fat. Musculoskeletal: No significant bony findings. No worrisome bone lesions or fractures. IMPRESSION: 1. Stable right apical post treatment changes. No findings suspicious for recurrent tumor. 2. Stable 7 mm ground-glass nodule in the right lower lobe. Attention on future studies is suggested. 3. Stable emphysematous  changes and pulmonary scarring. 4. No findings for abdominal/pelvic metastatic disease. 5. Stable small anterior abdominal wall hernias containing fat. 6. Emphysema and aortic atherosclerosis. Aortic Atherosclerosis (ICD10-I70.0) and Emphysema (ICD10-J43.9). Electronically Signed   By: Marijo Sanes M.D.   On: 08/28/2020 08:40   CT Abdomen Pelvis W Contrast  Result Date: 08/28/2020 CLINICAL DATA:  Restaging metastatic non-small cell lung cancer. EXAM: CT CHEST, ABDOMEN, AND PELVIS WITH CONTRAST TECHNIQUE: Multidetector CT imaging of the chest, abdomen and pelvis was performed following the standard protocol during bolus administration of intravenous contrast. CONTRAST:  179mL ISOVUE-300 IOPAMIDOL (ISOVUE-300) INJECTION 61% COMPARISON:  CT scan 06/04/2020 FINDINGS: CT CHEST FINDINGS Cardiovascular: The heart is normal in size. No pericardial effusion. Mild tortuosity and moderate atherosclerotic calcification involving the thoracic aorta. No focal aneurysm or dissection. Stable scattered coronary artery calcifications. The pulmonary arteries are unremarkable. Mediastinum/Nodes: Small scattered mediastinal and hilar lymph nodes. These all measure less than 8 mm. No new or progressive findings. The esophagus is grossly normal. The  thyroid gland is unremarkable. Lungs/Pleura: Stable right apical post treatment changes, likely radiation fibrosis and associated volume loss. No findings suspicious for recurrent tumor. Stable underlying emphysematous changes and areas of pulmonary scarring. No worrisome pulmonary lesions or pulmonary nodules to suggest metastatic disease. 7 mm ground-glass nodule in the right lower lobe on image number 96/4 is stable. No pleural effusions or pleural nodules. The central tracheobronchial tree is unremarkable. Musculoskeletal: No breast masses, supraclavicular or axillary adenopathy. The bony thorax is intact.  No worrisome bone lesions or fractures. CT ABDOMEN PELVIS FINDINGS  Hepatobiliary: No hepatic lesions to suggest metastatic disease. The gallbladder is unremarkable. No biliary dilatation. Pancreas: No mass, inflammation or ductal dilatation. Spleen: Normal size.  No focal lesions. Adrenals/Urinary Tract: Adrenal glands are normal. No renal, ureteral or bladder calculi or mass. Stomach/Bowel: The stomach, duodenum, small bowel and colon unremarkable. No acute inflammatory changes, mass lesions or obstructive findings. Stable surgical changes involving the lower sigmoid colon. Vascular/Lymphatic: Stable atherosclerotic calcifications involving the aorta and iliac arteries. The branch vessels are patent. The major venous structures are patent. No mesenteric or retroperitoneal mass or adenopathy. Reproductive: Surgically absent. Other: No pelvic mass or adenopathy. No free pelvic fluid collections. No inguinal mass or adenopathy. Stable small anterior abdominal wall hernias containing fat. Musculoskeletal: No significant bony findings. No worrisome bone lesions or fractures. IMPRESSION: 1. Stable right apical post treatment changes. No findings suspicious for recurrent tumor. 2. Stable 7 mm ground-glass nodule in the right lower lobe. Attention on future studies is suggested. 3. Stable emphysematous changes and pulmonary scarring. 4. No findings for abdominal/pelvic metastatic disease. 5. Stable small anterior abdominal wall hernias containing fat. 6. Emphysema and aortic atherosclerosis. Aortic Atherosclerosis (ICD10-I70.0) and Emphysema (ICD10-J43.9). Electronically Signed   By: Marijo Sanes M.D.   On: 08/28/2020 08:40    ASSESSMENT AND PLAN:  This is a very pleasant 70 years old white female with metastatic non-small cell lung cancer, adenocarcinoma status post induction systemic chemotherapy with carboplatin and Alimta with partial response. The patient is currently on maintenance treatment with single agent Alimta status post 109 cycles. She has been tolerating this  treatment well with no concerning adverse effects. I recommended for her to proceed with cycle #110 today as planned. For the anxiety, the patient will continue her treatment with Xanax on as-needed basis. For the type 2 diabetes mellitus, she is currently on Metformin and followed by her primary care physician. The patient will come back for follow-up visit in 3 weeks for evaluation before the next cycle of her treatment. She was advised to call immediately if she has any concerning symptoms in the interval. The patient voices understanding of current disease status and treatment options and is in agreement with the current care plan. All questions were answered. The patient knows to call the clinic with any problems, questions or concerns. We can certainly see the patient much sooner if necessary.  Disclaimer: This note was dictated with voice recognition software. Similar sounding words can inadvertently be transcribed and may not be corrected upon review.

## 2020-09-23 ENCOUNTER — Telehealth: Payer: Self-pay | Admitting: Internal Medicine

## 2020-09-23 NOTE — Telephone Encounter (Signed)
Scheduled appointments per 1/24 los. Will have updated calendar printed for patient at next visit.

## 2020-10-02 ENCOUNTER — Other Ambulatory Visit: Payer: Self-pay | Admitting: Radiation Therapy

## 2020-10-02 DIAGNOSIS — C7931 Secondary malignant neoplasm of brain: Secondary | ICD-10-CM

## 2020-10-08 NOTE — Progress Notes (Signed)
[facility] OFFICE PROGRESS NOTE  [name], [name] [address]  DIAGNOSIS: Stage IV (T2a, N0, M1b) non-small cell lung cancer, adenocarcinoma with negative EGFR and ALK mutations diagnosed in January 2015 and presented with right upper lobe lung mass in addition to a solitary brain metastasis  PRIOR THERAPY:  1) Status post stereotactic radiotherapy to a solitary right parietal brain lesion under the care of Dr. [name] on 10/16/2013. 2) Status post palliative radiotherapy to the right lung tumor under the care of Dr. [name] completed on 12/05/2013. 3) Systemic chemotherapy with carboplatin for AUC of 5 and Alimta 500 mg/M2 every 3 weeks. First dose Jan 06 2014. Status post 6 cycles.  CURRENT THERAPY: Systemic chemotherapy with maintenance Alimta 500 MG/M2 every 3 weeks, status post110cycles.  INTERVAL HISTORY: Alexis [name] 70 y.o. female returns to the clinic today for a follow up visit accompanied by her husband. The patient is feeling well today without any concerning complaints. She has been losing weight intentionally after being diagnosed with diabetes. [place] patient continues to tolerate treatment withsingle agent Alimta[place] without any adverse effects except flushing from the steroids and feeling hot from the steroids. Denies any fevers or chills. Denies any chest pain, cough, or hemoptysis.Denies significant or worsening shortness of breath. She actually noticed improvement in her shortness of breath and exercise tolerance after losing weight and using the stationary bike.Denies any nausea, vomiting,constipation, or diarrhea.Denies any headache or visual changes. The patient is here today for evaluation prior to starting cycle #111  MEDICAL HISTORY: Past Medical History:  Diagnosis Date  . Anxiety   . Anxiety 06/20/2016  . Cervical cancer ([facility])   . Diabetes mellitus without complication [facility])    patient states she has  type 2  . Encounter for antineoplastic chemotherapy 07/20/2015  . 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  MEDICATIONS:  Current Outpatient Medications  Medication Sig Dispense Refill  . omeprazole (PRILOSEC) 20 MG capsule Take 1 capsule (20 mg total) by mouth daily. 30 capsule 1  . acetaminophen (TYLENOL) 500 MG tablet Take 500 mg by mouth every 6 (six) hours as needed for mild pain or headache. Reported on 11/02/2015    . ALPRAZolam (XANAX) 1 MG tablet SMARTSIG:1 Tablet(s) By Mouth 1 to 3 Times Daily PRN    . Ascorbic Acid (VITAMIN C GUMMIE PO) Take 1 each by mouth every morning.    Marland Kitchen aspirin EC 81 MG tablet Take 1 tablet (81 mg total) by mouth daily. 150 tablet 2  . [employer] ACID CONTROLLER 10 MG tablet SMARTSIG:1 Tablet(s) By Mouth Every 12 Hours PRN    . dexamethasone (DECADRON) 4 MG tablet Take 1 tablet twice a day the day before, the day of, and the day after chemotherapy. 40 tablet 2  . folic acid (FOLVITE) 1 MG tablet TAKE 1 TABLET BY MOUTH EVERY DAY 90 tablet 0  . HYDROcodone-acetaminophen (NORCO/VICODIN) 5-325 MG tablet TAKE 1 TABLET BY MOUTH 3 TIMES A DAY FOR 5 DAYS AS NEEDED FOR PAIN    . metFORMIN (GLUCOPHAGE) 500 MG tablet Take 500 mg by mouth 2 (two) times daily.    . Multiple Vitamin (MULTIVITAMIN WITH MINERALS) TABS tablet Take 1 tablet by mouth every morning.    . ondansetron (ZOFRAN) 8 MG tablet TAKE 1 TABLET BY MOUTH BEFORE CHEMO 30  tablet 0  . OVER THE COUNTER MEDICATION Take 1 tablet by mouth every morning. (Vitamin A)    . prochlorperazine (COMPAZINE) 10 MG tablet Take 1 tablet (10 mg total) by mouth every 6 (six) hours as needed for nausea or vomiting. 60 tablet 0  . rosuvastatin (CRESTOR) 10 MG tablet Take 1 tablet (10 mg total) by mouth daily. 30 tablet 12  . senna-docusate (SENOKOT-S) 8.6-50 MG tablet Take 1 tablet by mouth daily. 30 tablet prn  .  valACYclovir (VALTREX) 1000 MG tablet Take 1,000 mg by mouth 3 (three) times daily.    . Vitamin D, Ergocalciferol, (DRISDOL) 1.25 MG (50000 UNIT) CAPS capsule Take 50,000 Units by mouth once a week.     No current facility-administered medications for this visit.   Facility-Administered Medications Ordered in Other Visits  Medication Dose Route Frequency Provider Last Rate Last Admin  . dexamethasone (DECADRON) 10 mg in sodium chloride 0.9 % 50 mL IVPB  10 mg Intravenous Once Curt Bears, MD      . PEMEtrexed (ALIMTA) 900 mg in sodium chloride 0.9 % 100 mL chemo infusion  500 mg/m2 (Treatment Plan Recorded) Intravenous Once Curt Bears, MD      . sodium chloride flush (NS) 0.9 % injection 10 mL  10 mL Intravenous PRN Curt Bears, MD      . sodium chloride flush (NS) 0.9 % injection 10 mL  10 mL Intravenous PRN Curt Bears, MD        SURGICAL HISTORY:  Past Surgical History:  Procedure Laterality Date  . ABDOMINAL HYSTERECTOMY    . COLOSTOMY TAKEDOWN N/A 07/10/2014   Procedure: LAPAROSCOPIC LYSIS OF ADHESIONS (90 MIN) LAPAROSCOPIC ASSISTED COLOSTOMY CLOSURE, RIGID PROCTOSIGMOIDOSCOPY;  Surgeon: Jackolyn Confer, MD;  Location: [facility];  Service: General;  Laterality: N/A;  . LAPAROTOMY N/A 11/03/2013   Procedure: EXPLORATORY LAPAROTOMY, DRAINAGE OF INTRA  ABDOMINAL ABSCESSES, MOBILIZATION OF SPLENIC FLEXURE, SIGMOID COLECTOMY WITH COLOSTOMY;  Surgeon: Odis Hollingshead, MD;  Location: [facility];  Service: General;  Laterality: N/A;  . VIDEO BRONCHOSCOPY Bilateral 08/30/2013   Procedure: VIDEO BRONCHOSCOPY WITH FLUORO;  Surgeon: Tanda Rockers, MD;  Location: [facility];  Service: Cardiopulmonary;  Laterality: Bilateral;    REVIEW OF SYSTEMS:   Review of Systems  Constitutional: Negative for appetite change, chills, fatigue, fever and unexpected weight change.  HENT: Negative for mouth sores, nosebleeds, sore throat and trouble swallowing.   Eyes: Negative for eye problems  and icterus.  Respiratory: Negative for cough, hemoptysis, shortness of breath and wheezing.   Cardiovascular: Negative for chest pain and leg swelling.  Gastrointestinal: Negative for abdominal pain, constipation, diarrhea, nausea and vomiting.  Genitourinary: Negative for bladder incontinence, difficulty urinating, dysuria, frequency and hematuria.   Musculoskeletal: Negative for back pain, gait problem, neck pain and neck stiffness.  Skin: Negative for itching and rash.  Neurological: Negative for dizziness, extremity weakness, gait problem, headaches, light-headedness and seizures.  Hematological: Negative for adenopathy. Does not bruise/bleed easily.  Psychiatric/Behavioral: Negative for confusion, depression and sleep disturbance. The patient is not nervous/anxious.     PHYSICAL EXAMINATION:  Blood pressure (!) 141/63, pulse 99, temperature 97.7 F (36.5 C), temperature source Tympanic, resp. rate 14, height 5' 4.5" (1.638 m), weight 177 lb 12.8 oz (80.6 kg), SpO2 99 %.  ECOG PERFORMANCE STATUS: 1 - Symptomatic but completely ambulatory  Physical Exam  Constitutional: Oriented to person, place, and time and well-developed, well-nourished, and in no distress. No distress.  HENT:  Head: Normocephalic and atraumatic.  Mouth/Throat: Oropharynx is clear and moist. No oropharyngeal exudate.  Eyes: Conjunctivae are normal. Right eye exhibits no discharge. Left eye exhibits no discharge. No scleral icterus.  Neck: Normal range of motion. Neck supple.  Cardiovascular: Normal rate, regular rhythm, normal heart sounds and intact distal pulses.   Pulmonary/Chest: Effort normal and breath sounds normal. No respiratory distress. No wheezes. No rales.  Abdominal: Soft. Bowel sounds are normal. Exhibits no distension and no mass. There is no tenderness.  Musculoskeletal: Normal range of motion. Exhibits no edema.  Lymphadenopathy:    No cervical adenopathy.  Neurological: Alert and oriented to  person, place, and time. Exhibits normal muscle tone. Gait normal. Coordination normal.  Skin: Skin is warm and dry. No rash noted. Not diaphoretic. No erythema. No pallor.  Psychiatric: Mood, memory and judgment normal.  Vitals reviewed.  LABORATORY DATA: Lab Results  Component Value Date   WBC 9.7 10/12/2020   HGB 12.6 10/12/2020   HCT 39.3 10/12/2020   MCV 100.8 (H) 10/12/2020   PLT 330 10/12/2020      Chemistry      Component Value Date/Time   NA 139 10/12/2020 0819   NA 139 08/14/2017 0837   K 4.1 10/12/2020 0819   K 4.4 08/14/2017 0837   CL 107 10/12/2020 0819   CO2 24 10/12/2020 0819   CO2 24 08/14/2017 0837   BUN 15 10/12/2020 0819   BUN 13.3 08/14/2017 0837   CREATININE 0.96 10/12/2020 0819   CREATININE 0.75 12/02/2019 0753   CREATININE 0.8 08/14/2017 0837      Component Value Date/Time   CALCIUM 10.0 10/12/2020 0819   CALCIUM 9.3 08/14/2017 0837   ALKPHOS 70 10/12/2020 0819   ALKPHOS 107 08/14/2017 0837   AST 15 10/12/2020 0819   AST 17 12/02/2019 0753   AST 12 08/14/2017 0837   ALT 19 10/12/2020 0819   ALT 16 12/02/2019 0753   ALT 12 08/14/2017 0837   BILITOT 0.4 10/12/2020 0819   BILITOT 0.4 12/02/2019 0753   BILITOT 0.37 08/14/2017 0837       RADIOGRAPHIC STUDIES:  No results found.   ASSESSMENT/PLAN:  This isa very pleasant 70 year old Caucasian female with stage IV non-small cell lung cancer, adenocarcinoma who presented with a right upper lobe lungmass in addition to a solitary brain metastatsis. She was diagnosed in January 2015. The patient had completed induction systemic chemotherapy with carboplatin and Alimta with a partial response. The patient is currently being treated with single agentmaintenanceAlimta. She is status post110cycles.   Labs were reviewed.Recommend that she proceedwith cycle #111today as scheduled.  We will see her back for a follow up visit in 3 weeks for evaluation before starting cycle #112  The  patient was advised to call immediately if she has any concerning symptoms in the interval. The patient voices understanding of current disease status and treatment options and is in agreement with the current care plan. All questions were answered. The patient knows to call the clinic with any problems, questions or concerns. We can certainly see the patient much sooner if necessary    No orders of the defined types were placed in this encounter.    I spent 20-29 minutes in the encounter.   Naziya Hegwood L Orene Abbasi, PA-C 10/12/20

## 2020-10-12 ENCOUNTER — Telehealth: Payer: Self-pay | Admitting: Physician Assistant

## 2020-10-12 ENCOUNTER — Encounter: Payer: Self-pay | Admitting: Physician Assistant

## 2020-10-12 ENCOUNTER — Inpatient Hospital Stay: Payer: PPO | Attending: Internal Medicine | Admitting: Physician Assistant

## 2020-10-12 ENCOUNTER — Inpatient Hospital Stay: Payer: PPO

## 2020-10-12 ENCOUNTER — Other Ambulatory Visit: Payer: Self-pay

## 2020-10-12 VITALS — BP 141/63 | HR 99 | Temp 97.7°F | Resp 14 | Ht 64.5 in | Wt 177.8 lb

## 2020-10-12 DIAGNOSIS — C3411 Malignant neoplasm of upper lobe, right bronchus or lung: Secondary | ICD-10-CM

## 2020-10-12 DIAGNOSIS — Z9221 Personal history of antineoplastic chemotherapy: Secondary | ICD-10-CM | POA: Diagnosis not present

## 2020-10-12 DIAGNOSIS — C7931 Secondary malignant neoplasm of brain: Secondary | ICD-10-CM

## 2020-10-12 DIAGNOSIS — Z79899 Other long term (current) drug therapy: Secondary | ICD-10-CM | POA: Insufficient documentation

## 2020-10-12 DIAGNOSIS — E119 Type 2 diabetes mellitus without complications: Secondary | ICD-10-CM | POA: Insufficient documentation

## 2020-10-12 DIAGNOSIS — Z5111 Encounter for antineoplastic chemotherapy: Secondary | ICD-10-CM | POA: Insufficient documentation

## 2020-10-12 DIAGNOSIS — Z923 Personal history of irradiation: Secondary | ICD-10-CM | POA: Insufficient documentation

## 2020-10-12 DIAGNOSIS — C7949 Secondary malignant neoplasm of other parts of nervous system: Secondary | ICD-10-CM | POA: Diagnosis not present

## 2020-10-12 LAB — COMPREHENSIVE METABOLIC PANEL
ALT: 19 U/L (ref 0–44)
AST: 15 U/L (ref 15–41)
Albumin: 3.7 g/dL (ref 3.5–5.0)
Alkaline Phosphatase: 70 U/L (ref 38–126)
Anion gap: 8 (ref 5–15)
BUN: 15 mg/dL (ref 8–23)
CO2: 24 mmol/L (ref 22–32)
Calcium: 10 mg/dL (ref 8.9–10.3)
Chloride: 107 mmol/L (ref 98–111)
Creatinine, Ser: 0.96 mg/dL (ref 0.44–1.00)
GFR, Estimated: >60 mL/min (ref >60)
Glucose, Bld: 139 mg/dL — ABNORMAL HIGH (ref 70–99)
Potassium: 4.1 mmol/L (ref 3.5–5.1)
Sodium: 139 mmol/L (ref 135–145)
Total Bilirubin: 0.4 mg/dL (ref 0.3–1.2)
Total Protein: 7 g/dL (ref 6.5–8.1)

## 2020-10-12 LAB — CBC WITH DIFFERENTIAL/PLATELET
Abs Immature Granulocytes: 0.07 10*3/uL (ref 0.00–0.07)
Basophils Absolute: 0 10*3/uL (ref 0.0–0.1)
Basophils Relative: 0 %
Eosinophils Absolute: 0 10*3/uL (ref 0.0–0.5)
Eosinophils Relative: 0 %
HCT: 39.3 % (ref 36.0–46.0)
Hemoglobin: 12.6 g/dL (ref 12.0–15.0)
Immature Granulocytes: 1 %
Lymphocytes Relative: 7 %
Lymphs Abs: 0.7 10*3/uL (ref 0.7–4.0)
MCH: 32.3 pg (ref 26.0–34.0)
MCHC: 32.1 g/dL (ref 30.0–36.0)
MCV: 100.8 fL — ABNORMAL HIGH (ref 80.0–100.0)
Monocytes Absolute: 0.8 10*3/uL (ref 0.1–1.0)
Monocytes Relative: 8 %
Neutro Abs: 8.1 10*3/uL — ABNORMAL HIGH (ref 1.7–7.7)
Neutrophils Relative %: 84 %
Platelets: 330 10*3/uL (ref 150–400)
RBC: 3.9 MIL/uL (ref 3.87–5.11)
RDW: 15.2 % (ref 11.5–15.5)
WBC: 9.7 10*3/uL (ref 4.0–10.5)
nRBC: 0 % (ref 0.0–0.2)

## 2020-10-12 MED ORDER — SODIUM CHLORIDE 0.9 % IV SOLN
10.0000 mg | Freq: Once | INTRAVENOUS | Status: AC
  Administered 2020-10-12: 10 mg via INTRAVENOUS
  Filled 2020-10-12: qty 10

## 2020-10-12 MED ORDER — DEXAMETHASONE 4 MG PO TABS: ORAL_TABLET | ORAL | 2 refills | Status: DC

## 2020-10-12 MED ORDER — OMEPRAZOLE 20 MG PO CPDR: 20.0000 mg | DELAYED_RELEASE_CAPSULE | Freq: Every day | ORAL | 1 refills | Status: DC

## 2020-10-12 MED ORDER — SODIUM CHLORIDE 0.9 % IV SOLN
Freq: Once | INTRAVENOUS | Status: AC
  Filled 2020-10-12: qty 250

## 2020-10-12 MED ORDER — SODIUM CHLORIDE 0.9 % IV SOLN
500.0000 mg/m2 | Freq: Once | INTRAVENOUS | Status: AC
  Administered 2020-10-12: 900 mg via INTRAVENOUS
  Filled 2020-10-12: qty 20

## 2020-10-12 NOTE — Patient Instructions (Signed)
[facility] Discharge Instructions for Patients Receiving Chemotherapy  Today you received the following chemotherapy agents: Pemetrexed (Alimta)  To help prevent nausea and vomiting after your treatment, we encourage you to take your nausea medication  as prescribed.    If you develop nausea and vomiting that is not controlled by your nausea medication, call the clinic.   BELOW ARE SYMPTOMS THAT SHOULD BE REPORTED IMMEDIATELY:  *FEVER GREATER THAN 100.5 F  *CHILLS WITH OR WITHOUT FEVER  NAUSEA AND VOMITING THAT IS NOT CONTROLLED WITH YOUR NAUSEA MEDICATION  *UNUSUAL SHORTNESS OF BREATH  *UNUSUAL BRUISING OR BLEEDING  TENDERNESS IN MOUTH AND THROAT WITH OR WITHOUT PRESENCE OF ULCERS  *URINARY PROBLEMS  *BOWEL PROBLEMS  UNUSUAL RASH Items with * indicate a potential emergency and should be followed up as soon as possible.  Feel free to call the clinic should you have any questions or concerns. The clinic phone number is [phone].  Please show the [facility] at check-in to the Emergency Department and triage nurse.

## 2020-10-12 NOTE — Telephone Encounter (Signed)
Scheduled appointments per 2/14 los. Will have updated calendar printed for patient at next visit.

## 2020-10-29 DIAGNOSIS — H6123 Impacted cerumen, bilateral: Secondary | ICD-10-CM | POA: Diagnosis not present

## 2020-10-29 DIAGNOSIS — J0100 Acute maxillary sinusitis, unspecified: Secondary | ICD-10-CM | POA: Diagnosis not present

## 2020-11-02 ENCOUNTER — Encounter: Payer: Self-pay | Admitting: *Deleted

## 2020-11-02 ENCOUNTER — Other Ambulatory Visit: Payer: Self-pay

## 2020-11-02 ENCOUNTER — Inpatient Hospital Stay: Payer: PPO | Attending: Internal Medicine

## 2020-11-02 ENCOUNTER — Inpatient Hospital Stay: Payer: PPO

## 2020-11-02 ENCOUNTER — Inpatient Hospital Stay: Payer: PPO | Admitting: Internal Medicine

## 2020-11-02 VITALS — HR 99

## 2020-11-02 VITALS — BP 124/63 | HR 102 | Temp 97.8°F | Resp 14 | Ht 64.5 in | Wt 176.2 lb

## 2020-11-02 DIAGNOSIS — Z8541 Personal history of malignant neoplasm of cervix uteri: Secondary | ICD-10-CM | POA: Insufficient documentation

## 2020-11-02 DIAGNOSIS — C3490 Malignant neoplasm of unspecified part of unspecified bronchus or lung: Secondary | ICD-10-CM | POA: Diagnosis not present

## 2020-11-02 DIAGNOSIS — C3411 Malignant neoplasm of upper lobe, right bronchus or lung: Secondary | ICD-10-CM

## 2020-11-02 DIAGNOSIS — E119 Type 2 diabetes mellitus without complications: Secondary | ICD-10-CM | POA: Diagnosis not present

## 2020-11-02 DIAGNOSIS — Z923 Personal history of irradiation: Secondary | ICD-10-CM | POA: Insufficient documentation

## 2020-11-02 DIAGNOSIS — Z5111 Encounter for antineoplastic chemotherapy: Secondary | ICD-10-CM

## 2020-11-02 DIAGNOSIS — Z5112 Encounter for antineoplastic immunotherapy: Secondary | ICD-10-CM | POA: Diagnosis not present

## 2020-11-02 DIAGNOSIS — Z9221 Personal history of antineoplastic chemotherapy: Secondary | ICD-10-CM | POA: Diagnosis not present

## 2020-11-02 DIAGNOSIS — Z79899 Other long term (current) drug therapy: Secondary | ICD-10-CM | POA: Insufficient documentation

## 2020-11-02 DIAGNOSIS — Z7984 Long term (current) use of oral hypoglycemic drugs: Secondary | ICD-10-CM | POA: Insufficient documentation

## 2020-11-02 DIAGNOSIS — C7931 Secondary malignant neoplasm of brain: Secondary | ICD-10-CM | POA: Diagnosis not present

## 2020-11-02 DIAGNOSIS — I10 Essential (primary) hypertension: Secondary | ICD-10-CM | POA: Diagnosis not present

## 2020-11-02 LAB — CBC WITH DIFFERENTIAL/PLATELET
Abs Immature Granulocytes: 0.05 10*3/uL (ref 0.00–0.07)
Basophils Absolute: 0 10*3/uL (ref 0.0–0.1)
Basophils Relative: 0 %
Eosinophils Absolute: 0 10*3/uL (ref 0.0–0.5)
Eosinophils Relative: 0 %
HCT: 38.5 % (ref 36.0–46.0)
Hemoglobin: 12.3 g/dL (ref 12.0–15.0)
Immature Granulocytes: 1 %
Lymphocytes Relative: 8 %
Lymphs Abs: 0.5 10*3/uL — ABNORMAL LOW (ref 0.7–4.0)
MCH: 32.1 pg (ref 26.0–34.0)
MCHC: 31.9 g/dL (ref 30.0–36.0)
MCV: 100.5 fL — ABNORMAL HIGH (ref 80.0–100.0)
Monocytes Absolute: 0.7 10*3/uL (ref 0.1–1.0)
Monocytes Relative: 12 %
Neutro Abs: 4.8 10*3/uL (ref 1.7–7.7)
Neutrophils Relative %: 79 %
Platelets: 299 10*3/uL (ref 150–400)
RBC: 3.83 MIL/uL — ABNORMAL LOW (ref 3.87–5.11)
RDW: 14.8 % (ref 11.5–15.5)
WBC: 6.1 10*3/uL (ref 4.0–10.5)
nRBC: 0 % (ref 0.0–0.2)

## 2020-11-02 LAB — COMPREHENSIVE METABOLIC PANEL
ALT: 18 U/L (ref 0–44)
AST: 17 U/L (ref 15–41)
Albumin: 3.5 g/dL (ref 3.5–5.0)
Alkaline Phosphatase: 60 U/L (ref 38–126)
Anion gap: 6 (ref 5–15)
BUN: 13 mg/dL (ref 8–23)
CO2: 25 mmol/L (ref 22–32)
Calcium: 9.4 mg/dL (ref 8.9–10.3)
Chloride: 108 mmol/L (ref 98–111)
Creatinine, Ser: 0.8 mg/dL (ref 0.44–1.00)
GFR, Estimated: >60 mL/min (ref >60)
Glucose, Bld: 152 mg/dL — ABNORMAL HIGH (ref 70–99)
Potassium: 3.9 mmol/L (ref 3.5–5.1)
Sodium: 139 mmol/L (ref 135–145)
Total Bilirubin: 0.4 mg/dL (ref 0.3–1.2)
Total Protein: 6.7 g/dL (ref 6.5–8.1)

## 2020-11-02 MED ORDER — SODIUM CHLORIDE 0.9 % IV SOLN
Freq: Once | INTRAVENOUS | Status: AC
  Filled 2020-11-02: qty 250

## 2020-11-02 MED ORDER — SODIUM CHLORIDE 0.9 % IV SOLN
500.0000 mg/m2 | Freq: Once | INTRAVENOUS | Status: AC
  Administered 2020-11-02: 900 mg via INTRAVENOUS
  Filled 2020-11-02: qty 20

## 2020-11-02 MED ORDER — SODIUM CHLORIDE 0.9 % IV SOLN
10.0000 mg | Freq: Once | INTRAVENOUS | Status: AC
  Administered 2020-11-02: 10 mg via INTRAVENOUS
  Filled 2020-11-02: qty 10

## 2020-11-02 MED ORDER — CYANOCOBALAMIN 1000 MCG/ML IJ SOLN
INTRAMUSCULAR | Status: AC
  Filled 2020-11-02: qty 1

## 2020-11-02 MED ORDER — CYANOCOBALAMIN 1000 MCG/ML IJ SOLN: 1000.0000 ug | Freq: Once | INTRAMUSCULAR | Status: DC

## 2020-11-02 NOTE — Progress Notes (Signed)
[facility] [phone]   [phone]  OFFICE PROGRESS NOTE  Elisabeth Most, FNP [address] [place] [address]  DIAGNOSIS: Stage IV (T2a, N0, M1b) non-small cell lung cancer, adenocarcinoma with negative EGFR and ALK mutations diagnosed in January 2015 and presented with right upper lobe lung mass in addition to a solitary brain metastasis.  PRIOR THERAPY: 1) Status post stereotactic radiotherapy to a solitary right parietal brain lesion under the care of Dr. Mitzi Hansen on 10/16/2013. 2) Status post palliative radiotherapy to the right lung tumor under the care of Dr. Mitzi Hansen completed on 12/05/2013. 3) Systemic chemotherapy with carboplatin for AUC of 5 and Alimta 500 mg/M2 every 3 weeks. First dose Jan 06 2014. Status post 6 cycles.  CURRENT THERAPY: Systemic chemotherapy with maintenance Alimta 500 MG/M2 every 3 weeks, status post 111 cycles.  INTERVAL HISTORY: Alexis [name] 70 y.o. female returns to the clinic today for follow-up visit.  The patient is feeling fine today with no concerning complaints.  She denied having any current chest pain, shortness of breath, cough or hemoptysis.  She denied having any fever or chills.  She has no nausea, vomiting, diarrhea or constipation.  She has no headache or visual changes.  She continues to tolerate her treatment with maintenance Alimta fairly well.  She lost few pounds intentionally.  She is here today for evaluation before starting cycle #112.  MEDICAL HISTORY: Past Medical History:  Diagnosis Date  . Anxiety   . Anxiety 06/20/2016  . Cervical cancer (H[facility])   . Diabetes mellitus without complication [facility])    patient states she has type 2  . Encounter for antineoplastic chemotherapy 07/20/2015  . 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  MEDICATIONS:  Current Outpatient  Medications  Medication Sig Dispense Refill  . acetaminophen (TYLENOL) 500 MG tablet Take 500 mg by mouth every 6 (six) hours as needed for mild pain or headache. Reported on 11/02/2015    . ALPRAZolam (XANAX) 1 MG tablet SMARTSIG:1 Tablet(s) By Mouth 1 to 3 Times Daily PRN    . Ascorbic Acid (VITAMIN C GUMMIE PO) Take 1 each by mouth every morning.    Marland Kitchen aspirin EC 81 MG tablet Take 1 tablet (81 mg total) by mouth daily. 150 tablet 2  . [employer] ACID CONTROLLER 10 MG tablet SMARTSIG:1 Tablet(s) By Mouth Every 12 Hours PRN    . dexamethasone (DECADRON) 4 MG tablet Take 1 tablet twice a day the day before, the day of, and the day after chemotherapy. 40 tablet 2  . folic acid (FOLVITE) 1 MG tablet TAKE 1 TABLET BY MOUTH EVERY DAY 90 tablet 0  . HYDROcodone-acetaminophen (NORCO/VICODIN) 5-325 MG tablet TAKE 1 TABLET BY MOUTH 3 TIMES A DAY FOR 5 DAYS AS NEEDED FOR PAIN    . metFORMIN (GLUCOPHAGE) 500 MG tablet Take 500 mg by mouth 2 (two) times daily.    . Multiple Vitamin (MULTIVITAMIN WITH MINERALS) TABS tablet Take 1 tablet by mouth every morning.    Marland Kitchen omeprazole (PRILOSEC) 20 MG capsule Take 1 capsule (20 mg total) by mouth daily. 30 capsule 1  . ondansetron (ZOFRAN) 8 MG tablet TAKE 1 TABLET BY MOUTH BEFORE CHEMO 30 tablet 0  . OVER THE COUNTER MEDICATION Take 1 tablet by mouth every morning. (Vitamin A)    .  prochlorperazine (COMPAZINE) 10 MG tablet Take 1 tablet (10 mg total) by mouth every 6 (six) hours as needed for nausea or vomiting. 60 tablet 0  . rosuvastatin (CRESTOR) 10 MG tablet Take 1 tablet (10 mg total) by mouth daily. 30 tablet 12  . senna-docusate (SENOKOT-S) 8.6-50 MG tablet Take 1 tablet by mouth daily. 30 tablet prn  . valACYclovir (VALTREX) 1000 MG tablet Take 1,000 mg by mouth 3 (three) times daily.    . Vitamin D, Ergocalciferol, (DRISDOL) 1.25 MG (50000 UNIT) CAPS capsule Take 50,000 Units by mouth once a week.     No current facility-administered medications for this visit.    Facility-Administered Medications Ordered in Other Visits  Medication Dose Route Frequency Provider Last Rate Last Admin  . sodium chloride flush (NS) 0.9 % injection 10 mL  10 mL Intravenous PRN [name], MD      . sodium chloride flush (NS) 0.9 % injection 10 mL  10 mL Intravenous PRN [name], MD        SURGICAL HISTORY:  Past Surgical History:  Procedure Laterality Date  . ABDOMINAL HYSTERECTOMY    . COLOSTOMY TAKEDOWN N/A 07/10/2014   Procedure: LAPAROSCOPIC LYSIS OF ADHESIONS (90 MIN) LAPAROSCOPIC ASSISTED COLOSTOMY CLOSURE, RIGID PROCTOSIGMOIDOSCOPY;  Surgeon: Avel Peace, MD;  Location: [facility];  Service: General;  Laterality: N/A;  . LAPAROTOMY N/A 11/03/2013   Procedure: EXPLORATORY LAPAROTOMY, DRAINAGE OF INTRA  ABDOMINAL ABSCESSES, MOBILIZATION OF SPLENIC FLEXURE, SIGMOID COLECTOMY WITH COLOSTOMY;  Surgeon: Adolph Pollack, MD;  Location: [facility];  Service: General;  Laterality: N/A;  . VIDEO BRONCHOSCOPY Bilateral 08/30/2013   Procedure: VIDEO BRONCHOSCOPY WITH FLUORO;  Surgeon: Nyoka Cowden, MD;  Location: [facility];  Service: Cardiopulmonary;  Laterality: Bilateral;    REVIEW OF SYSTEMS:  A comprehensive review of systems was negative.   PHYSICAL EXAMINATION: General appearance: alert, cooperative and no distress Head: Normocephalic, without obvious abnormality, atraumatic Neck: no adenopathy, no JVD, supple, symmetrical, trachea midline and thyroid not enlarged, symmetric, no tenderness/mass/nodules Lymph nodes: Cervical, supraclavicular, and axillary nodes normal. Resp: clear to auscultation bilaterally Back: symmetric, no curvature. ROM normal. No CVA tenderness. Cardio: regular rate and rhythm, S1, S2 normal, no murmur, click, rub or gallop GI: soft, non-tender; bowel sounds normal; no masses,  no organomegaly Extremities: extremities normal, atraumatic, no cyanosis or edema   ECOG PERFORMANCE STATUS: 1 - Symptomatic but completely  ambulatory  Blood pressure 124/63, pulse (!) 102, temperature 97.8 F (36.6 C), temperature source Tympanic, resp. rate 14, height 5' 4.5" (1.638 m), weight 176 lb 3.2 oz (79.9 kg), SpO2 100 %.  LABORATORY DATA: Lab Results  Component Value Date   WBC 6.1 11/02/2020   HGB 12.3 11/02/2020   HCT 38.5 11/02/2020   MCV 100.5 (H) 11/02/2020   PLT 299 11/02/2020      Chemistry      Component Value Date/Time   NA 139 10/12/2020 0819   NA 139 08/14/2017 0837   K 4.1 10/12/2020 0819   K 4.4 08/14/2017 0837   CL 107 10/12/2020 0819   CO2 24 10/12/2020 0819   CO2 24 08/14/2017 0837   BUN 15 10/12/2020 0819   BUN 13.3 08/14/2017 0837   CREATININE 0.96 10/12/2020 0819   CREATININE 0.75 12/02/2019 0753   CREATININE 0.8 08/14/2017 0837      Component Value Date/Time   CALCIUM 10.0 10/12/2020 0819   CALCIUM 9.3 08/14/2017 0837   ALKPHOS 70 10/12/2020 0819   ALKPHOS 107 08/14/2017 0837  AST 15 10/12/2020 0819   AST 17 12/02/2019 0753   AST 12 08/14/2017 0837   ALT 19 10/12/2020 0819   ALT 16 12/02/2019 0753   ALT 12 08/14/2017 0837   BILITOT 0.4 10/12/2020 0819   BILITOT 0.4 12/02/2019 0753   BILITOT 0.37 08/14/2017 0837       RADIOGRAPHIC STUDIES: No results found.  ASSESSMENT AND PLAN:  This is a very pleasant 70 years old white female with metastatic non-small cell lung cancer, adenocarcinoma status post induction systemic chemotherapy with carboplatin and Alimta with partial response. The patient is currently on maintenance treatment with single agent Alimta status post 111 cycles. The patient continues to tolerate her treatment well with no concerning adverse effects. I recommended for her to proceed with cycle #112 today as planned. I will see her back for follow-up visit in 3 weeks for evaluation before starting cycle #113 with repeat CT scan of the chest, abdomen pelvis for restaging of her disease. For the anxiety, the patient will continue her treatment with Xanax  on as-needed basis. For the type 2 diabetes mellitus, she is currently on Metformin and followed by her primary care physician. She was advised to call immediately if she has any concerning symptoms in the interval. The patient voices understanding of current disease status and treatment options and is in agreement with the current care plan. All questions were answered. The patient knows to call the clinic with any problems, questions or concerns. We can certainly see the patient much sooner if necessary.  Disclaimer: This note was dictated with voice recognition software. Similar sounding words can inadvertently be transcribed and may not be corrected upon review.

## 2020-11-02 NOTE — Progress Notes (Signed)
Spoke to Alexis [name] today.  She is doing well without complaints.  I offered her support and encouragement.

## 2020-11-03 ENCOUNTER — Other Ambulatory Visit: Payer: Self-pay | Admitting: Physician Assistant

## 2020-11-03 DIAGNOSIS — C3411 Malignant neoplasm of upper lobe, right bronchus or lung: Secondary | ICD-10-CM

## 2020-11-04 ENCOUNTER — Encounter: Payer: Self-pay | Admitting: Family Medicine

## 2020-11-04 ENCOUNTER — Ambulatory Visit
Admission: RE | Admit: 2020-11-04 | Discharge: 2020-11-04 | Disposition: A | Discharge status: Home / Self Care | Payer: PPO | Source: Ambulatory Visit | Attending: Internal Medicine | Admitting: Internal Medicine

## 2020-11-04 DIAGNOSIS — J432 Centrilobular emphysema: Secondary | ICD-10-CM | POA: Diagnosis not present

## 2020-11-04 DIAGNOSIS — C3490 Malignant neoplasm of unspecified part of unspecified bronchus or lung: Secondary | ICD-10-CM

## 2020-11-04 DIAGNOSIS — M47814 Spondylosis without myelopathy or radiculopathy, thoracic region: Secondary | ICD-10-CM | POA: Diagnosis not present

## 2020-11-04 DIAGNOSIS — Z8669 Personal history of other diseases of the nervous system and sense organs: Secondary | ICD-10-CM | POA: Diagnosis not present

## 2020-11-04 DIAGNOSIS — M47816 Spondylosis without myelopathy or radiculopathy, lumbar region: Secondary | ICD-10-CM | POA: Diagnosis not present

## 2020-11-04 MED ORDER — IOPAMIDOL (ISOVUE-300) INJECTION 61%
100.0000 mL | Freq: Once | INTRAVENOUS | Status: AC | PRN
  Administered 2020-11-04: 100 mL via INTRAVENOUS

## 2020-11-19 ENCOUNTER — Other Ambulatory Visit: Payer: Self-pay | Admitting: Radiation Therapy

## 2020-11-23 ENCOUNTER — Inpatient Hospital Stay: Payer: PPO | Admitting: Internal Medicine

## 2020-11-23 ENCOUNTER — Encounter: Payer: Self-pay | Admitting: Internal Medicine

## 2020-11-23 ENCOUNTER — Other Ambulatory Visit: Payer: Self-pay

## 2020-11-23 ENCOUNTER — Inpatient Hospital Stay: Payer: PPO

## 2020-11-23 VITALS — HR 74

## 2020-11-23 VITALS — BP 144/74 | HR 105 | Temp 97.9°F | Resp 19 | Ht 64.5 in | Wt 173.6 lb

## 2020-11-23 DIAGNOSIS — Z5112 Encounter for antineoplastic immunotherapy: Secondary | ICD-10-CM | POA: Diagnosis not present

## 2020-11-23 DIAGNOSIS — C3411 Malignant neoplasm of upper lobe, right bronchus or lung: Secondary | ICD-10-CM | POA: Diagnosis not present

## 2020-11-23 DIAGNOSIS — Z5111 Encounter for antineoplastic chemotherapy: Secondary | ICD-10-CM

## 2020-11-23 LAB — CBC WITH DIFFERENTIAL (CANCER CENTER ONLY)
Abs Immature Granulocytes: 0.09 10*3/uL — ABNORMAL HIGH (ref 0.00–0.07)
Basophils Absolute: 0 10*3/uL (ref 0.0–0.1)
Basophils Relative: 0 %
Eosinophils Absolute: 0 10*3/uL (ref 0.0–0.5)
Eosinophils Relative: 0 %
HCT: 38.1 % (ref 36.0–46.0)
Hemoglobin: 12 g/dL (ref 12.0–15.0)
Immature Granulocytes: 1 %
Lymphocytes Relative: 9 %
Lymphs Abs: 0.6 10*3/uL — ABNORMAL LOW (ref 0.7–4.0)
MCH: 31.6 pg (ref 26.0–34.0)
MCHC: 31.5 g/dL (ref 30.0–36.0)
MCV: 100.3 fL — ABNORMAL HIGH (ref 80.0–100.0)
Monocytes Absolute: 0.7 10*3/uL (ref 0.1–1.0)
Monocytes Relative: 10 %
Neutro Abs: 5.1 10*3/uL (ref 1.7–7.7)
Neutrophils Relative %: 80 %
Platelet Count: 484 10*3/uL — ABNORMAL HIGH (ref 150–400)
RBC: 3.8 MIL/uL — ABNORMAL LOW (ref 3.87–5.11)
RDW: 15 % (ref 11.5–15.5)
WBC Count: 6.4 10*3/uL (ref 4.0–10.5)
nRBC: 0 % (ref 0.0–0.2)

## 2020-11-23 LAB — CMP (CANCER CENTER ONLY)
ALT: 13 U/L (ref 0–44)
AST: 14 U/L — ABNORMAL LOW (ref 15–41)
Albumin: 3.4 g/dL — ABNORMAL LOW (ref 3.5–5.0)
Alkaline Phosphatase: 66 U/L (ref 38–126)
Anion gap: 14 (ref 5–15)
BUN: 16 mg/dL (ref 8–23)
CO2: 25 mmol/L (ref 22–32)
Calcium: 9.6 mg/dL (ref 8.9–10.3)
Chloride: 103 mmol/L (ref 98–111)
Creatinine: 0.79 mg/dL (ref 0.44–1.00)
GFR, Estimated: >60 mL/min (ref >60)
Glucose, Bld: 142 mg/dL — ABNORMAL HIGH (ref 70–99)
Potassium: 4 mmol/L (ref 3.5–5.1)
Sodium: 142 mmol/L (ref 135–145)
Total Bilirubin: 0.3 mg/dL (ref 0.3–1.2)
Total Protein: 6.7 g/dL (ref 6.5–8.1)

## 2020-11-23 MED ORDER — SODIUM CHLORIDE 0.9 % IV SOLN
500.0000 mg/m2 | Freq: Once | INTRAVENOUS | Status: AC
  Administered 2020-11-23: 900 mg via INTRAVENOUS
  Filled 2020-11-23: qty 20

## 2020-11-23 MED ORDER — CYANOCOBALAMIN 1000 MCG/ML IJ SOLN
1000.0000 ug | Freq: Once | INTRAMUSCULAR | Status: AC
  Administered 2020-11-23: 1000 ug via INTRAMUSCULAR

## 2020-11-23 MED ORDER — CYANOCOBALAMIN 1000 MCG/ML IJ SOLN
INTRAMUSCULAR | Status: AC
  Filled 2020-11-23: qty 1

## 2020-11-23 MED ORDER — SODIUM CHLORIDE 0.9 % IV SOLN
Freq: Once | INTRAVENOUS | Status: AC
  Filled 2020-11-23: qty 250

## 2020-11-23 MED ORDER — SODIUM CHLORIDE 0.9 % IV SOLN
10.0000 mg | Freq: Once | INTRAVENOUS | Status: AC
  Administered 2020-11-23: 10 mg via INTRAVENOUS
  Filled 2020-11-23: qty 10

## 2020-11-23 NOTE — Progress Notes (Signed)
[facility] [phone]   [phone]  OFFICE PROGRESS NOTE  [name], [place] [address]  DIAGNOSIS: Stage IV (T2a, N0, M1b) non-small cell lung cancer, adenocarcinoma with negative EGFR and ALK mutations diagnosed in January 2015 and presented with right upper lobe lung mass in addition to a solitary brain metastasis.  PRIOR THERAPY: 1) Status post stereotactic radiotherapy to a solitary right parietal brain lesion under the care of Dr. [name] on 10/16/2013. 2) Status post palliative radiotherapy to the right lung tumor under the care of Dr. [name] completed on 12/05/2013. 3) Systemic chemotherapy with carboplatin for AUC of 5 and Alimta 500 mg/M2 every 3 weeks. First dose Jan 06 2014. Status post 6 cycles.  CURRENT THERAPY: Systemic chemotherapy with maintenance Alimta 500 MG/M2 every 3 weeks, status post 112 cycles.  INTERVAL HISTORY: Alexis [name] 70 y.o. female returns to the clinic today for follow-up visit.  The patient is feeling fine today with no concerning complaints.  She denied having any current chest pain, shortness of breath, cough or hemoptysis.  She denied having any fever or chills.  She has no nausea, vomiting, diarrhea or constipation.  She intentionally lost few pounds since her last visit.  The patient has no headache or visual changes.  She continues to tolerate her maintenance treatment with Alimta fairly well.  The patient is here today for evaluation with repeat CT scan of the chest, abdomen pelvis before cycle #113.  MEDICAL HISTORY: Past Medical History:  Diagnosis Date  . Anxiety   . Anxiety 06/20/2016  . Cervical cancer ([facility])   . Diabetes mellitus without complication [facility])    patient states she has type 2  . Encounter for antineoplastic chemotherapy 07/20/2015  . Malignant neoplasm of right upper lobe of lung ([facility])     non small cell lung cancer adenocarcioma with brain meta     ALLERGIES:  is allergic to codeine.  MEDICATIONS:  Current Outpatient Medications  Medication Sig Dispense Refill  . acetaminophen (TYLENOL) 500 MG tablet Take 500 mg by mouth every 6 (six) hours as needed for mild pain or headache. Reported on 11/02/2015    . ALPRAZolam (XANAX) 1 MG tablet SMARTSIG:1 Tablet(s) By Mouth 1 to 3 Times Daily PRN    . Ascorbic Acid (VITAMIN C GUMMIE PO) Take 1 each by mouth every morning.    Marland Kitchen aspirin EC 81 MG tablet Take 1 tablet (81 mg total) by mouth daily. 150 tablet 2  . [employer] ACID CONTROLLER 10 MG tablet SMARTSIG:1 Tablet(s) By Mouth Every 12 Hours PRN    . dexamethasone (DECADRON) 4 MG tablet Take 1 tablet twice a day the day before, the day of, and the day after chemotherapy. 40 tablet 2  . folic acid (FOLVITE) 1 MG tablet TAKE 1 TABLET BY MOUTH EVERY DAY 90 tablet 0  . HYDROcodone-acetaminophen (NORCO/VICODIN) 5-325 MG tablet TAKE 1 TABLET BY MOUTH 3 TIMES A DAY FOR 5 DAYS AS NEEDED FOR PAIN    . metFORMIN (GLUCOPHAGE) 500 MG tablet Take 500 mg by mouth 2 (two) times daily.    . Multiple Vitamin (MULTIVITAMIN WITH MINERALS) TABS tablet Take 1 tablet by mouth every morning.    Marland Kitchen omeprazole (PRILOSEC) 20 MG capsule TAKE 1 CAPSULE BY MOUTH EVERY DAY 30 capsule 1  . ondansetron (ZOFRAN) 8 MG tablet TAKE 1 TABLET BY MOUTH BEFORE CHEMO 30 tablet 0  . OVER THE COUNTER MEDICATION  Take 1 tablet by mouth every morning. (Vitamin A)    . prochlorperazine (COMPAZINE) 10 MG tablet Take 1 tablet (10 mg total) by mouth every 6 (six) hours as needed for nausea or vomiting. 60 tablet 0  . rosuvastatin (CRESTOR) 10 MG tablet Take 1 tablet (10 mg total) by mouth daily. 30 tablet 12  . senna-docusate (SENOKOT-S) 8.6-50 MG tablet Take 1 tablet by mouth daily. 30 tablet prn  . valACYclovir (VALTREX) 1000 MG tablet Take 1,000 mg by mouth 3 (three) times daily.    . Vitamin D, Ergocalciferol, (DRISDOL) 1.25 MG (50000 UNIT) CAPS capsule Take 50,000 Units by mouth once a week.      No current facility-administered medications for this visit.   Facility-Administered Medications Ordered in Other Visits  Medication Dose Route Frequency Provider Last Rate Last Admin  . sodium chloride flush (NS) 0.9 % injection 10 mL  10 mL Intravenous PRN Curt Bears, MD      . sodium chloride flush (NS) 0.9 % injection 10 mL  10 mL Intravenous PRN Curt Bears, MD        SURGICAL HISTORY:  Past Surgical History:  Procedure Laterality Date  . ABDOMINAL HYSTERECTOMY    . COLOSTOMY TAKEDOWN N/A 07/10/2014   Procedure: LAPAROSCOPIC LYSIS OF ADHESIONS (90 MIN) LAPAROSCOPIC ASSISTED COLOSTOMY CLOSURE, RIGID PROCTOSIGMOIDOSCOPY;  Surgeon: Jackolyn Confer, MD;  Location: [facility];  Service: General;  Laterality: N/A;  . LAPAROTOMY N/A 11/03/2013   Procedure: EXPLORATORY LAPAROTOMY, DRAINAGE OF INTRA  ABDOMINAL ABSCESSES, MOBILIZATION OF SPLENIC FLEXURE, SIGMOID COLECTOMY WITH COLOSTOMY;  Surgeon: Odis Hollingshead, MD;  Location: [facility];  Service: General;  Laterality: N/A;  . VIDEO BRONCHOSCOPY Bilateral 08/30/2013   Procedure: VIDEO BRONCHOSCOPY WITH FLUORO;  Surgeon: Tanda Rockers, MD;  Location: [facility];  Service: Cardiopulmonary;  Laterality: Bilateral;    REVIEW OF SYSTEMS:  Constitutional: negative Eyes: negative Ears, nose, mouth, throat, and face: negative Respiratory: negative Cardiovascular: negative Gastrointestinal: negative Genitourinary:negative Integument/breast: negative Hematologic/lymphatic: negative Musculoskeletal:negative Neurological: negative Behavioral/Psych: negative Endocrine: negative Allergic/Immunologic: negative   PHYSICAL EXAMINATION: General appearance: alert, cooperative and no distress Head: Normocephalic, without obvious abnormality, atraumatic Neck: no adenopathy, no JVD, supple, symmetrical, trachea midline and thyroid not enlarged, symmetric, no tenderness/mass/nodules Lymph nodes: Cervical, supraclavicular, and axillary nodes  normal. Resp: clear to auscultation bilaterally Back: symmetric, no curvature. ROM normal. No CVA tenderness. Cardio: regular rate and rhythm, S1, S2 normal, no murmur, click, rub or gallop GI: soft, non-tender; bowel sounds normal; no masses,  no organomegaly Extremities: extremities normal, atraumatic, no cyanosis or edema Neurologic: Alert and oriented X 3, normal strength and tone. Normal symmetric reflexes. Normal coordination and gait   ECOG PERFORMANCE STATUS: 0 - Asymptomatic  Blood pressure (!) 144/74, pulse (!) 105, temperature 97.9 F (36.6 C), temperature source Tympanic, resp. rate 19, height 5' 4.5" (1.638 m), weight 173 lb 9.6 oz (78.7 kg), SpO2 100 %.  LABORATORY DATA: Lab Results  Component Value Date   WBC 6.1 11/02/2020   HGB 12.3 11/02/2020   HCT 38.5 11/02/2020   MCV 100.5 (H) 11/02/2020   PLT 299 11/02/2020      Chemistry      Component Value Date/Time   NA 139 11/02/2020 0853   NA 139 08/14/2017 0837   K 3.9 11/02/2020 0853   K 4.4 08/14/2017 0837   CL 108 11/02/2020 0853   CO2 25 11/02/2020 0853   CO2 24 08/14/2017 0837   BUN 13 11/02/2020 0853   BUN 13.3 08/14/2017  9179   CREATININE 0.80 11/02/2020 0853   CREATININE 0.75 12/02/2019 0753   CREATININE 0.8 08/14/2017 0837      Component Value Date/Time   CALCIUM 9.4 11/02/2020 0853   CALCIUM 9.3 08/14/2017 0837   ALKPHOS 60 11/02/2020 0853   ALKPHOS 107 08/14/2017 0837   AST 17 11/02/2020 0853   AST 17 12/02/2019 0753   AST 12 08/14/2017 0837   ALT 18 11/02/2020 0853   ALT 16 12/02/2019 0753   ALT 12 08/14/2017 0837   BILITOT 0.4 11/02/2020 0853   BILITOT 0.4 12/02/2019 0753   BILITOT 0.37 08/14/2017 0837       RADIOGRAPHIC STUDIES: CT Chest W Contrast  Result Date: 11/04/2020 CLINICAL DATA:  Primary Cancer Type: Lung Imaging Indication: Assess response to therapy Interval therapy since last imaging? Yes Initial Cancer Diagnosis Date: 08/30/2013; Established by: Biopsy-proven Detailed  Pathology: Stage IV non-small cell lung cancer, adenocarcinoma. Primary Tumor location: Right upper lobe. Solitary brain metastasis. Surgeries: Sigmoid colectomy 11/03/2013; colostomy closure 07/10/2014. Hysterectomy. Chemotherapy: Yes; Ongoing? Yes; Most recent administration: 11/02/2020 Immunotherapy? No Radiation therapy? Yes Date Range: 10/28/2013-12/05/2013; Target: Right lung Date Range: 10/16/2013; Target: Right parietal brain lesion Other Cancers: Cervical cancer EXAM: CT CHEST, ABDOMEN, AND PELVIS WITH CONTRAST TECHNIQUE: Multidetector CT imaging of the chest, abdomen and pelvis was performed following the standard protocol during bolus administration of intravenous contrast. CONTRAST:  123m ISOVUE-300 IOPAMIDOL (ISOVUE-300) INJECTION 61% COMPARISON:  Most recent CT chest, abdomen and pelvis 08/27/2020. 09/09/2013 PET-CT. FINDINGS: CT CHEST FINDINGS Cardiovascular: Normal heart size. No significant pericardial effusion/thickening. Atherosclerotic nonaneurysmal thoracic aorta. Normal caliber pulmonary arteries. No central pulmonary emboli. Mediastinum/Nodes: Subcentimeter hypodense posterior left thyroid nodule is stable. Not clinically significant; no follow-up imaging recommended (ref: J Am Coll Radiol. 2015 Feb;12(2): 143-50). Unremarkable esophagus. No pathologically enlarged axillary, mediastinal or hilar lymph nodes. Lungs/Pleura: No pneumothorax. No pleural effusion. Moderate centrilobular emphysema with diffuse bronchial wall thickening. Sharply marginated patchy consolidation in medial apical right upper lobe with associated volume loss, unchanged, compatible with radiation fibrosis. No acute consolidative airspace disease. New subpleural 4 mm nodule in the right middle lobe appearing somewhat linear on coronal reformat (series 4/image 104). Previously described vague 0.6 cm ground-glass pulmonary nodule in the posterior right lower lobe (series 4/image 96), stable. No additional significant  pulmonary nodules. Musculoskeletal: No aggressive appearing focal osseous lesions. Moderate thoracic spondylosis. CT ABDOMEN PELVIS FINDINGS Hepatobiliary: Normal liver with no liver mass. Normal gallbladder with no radiopaque cholelithiasis. No biliary ductal dilatation. Pancreas: Normal, with no mass or duct dilation. Spleen: Normal size. No mass. Adrenals/Urinary Tract: Normal adrenals. No overt hydronephrosis. Stable subcentimeter hypodense upper left renal lesion, too small to characterize, requiring no further evaluation. Otherwise no renal masses. Normal bladder. Stomach/Bowel: Normal non-distended stomach. Normal caliber small bowel with no small bowel wall thickening. Normal appendix. Oral contrast transits to the colon. Stable postsurgical changes from subtotal distal colectomy with intact appearing distal colonic anastomosis. No large bowel wall thickening or significant pericolonic fat stranding. Vascular/Lymphatic: Atherosclerotic nonaneurysmal abdominal aorta. Patent portal, splenic, hepatic and renal veins. No pathologically enlarged lymph nodes in the abdomen or pelvis. Reproductive: Status post hysterectomy, with no abnormal findings at the vaginal cuff. No adnexal mass. Other: No pneumoperitoneum, ascites or focal fluid collection. Musculoskeletal: No aggressive appearing focal osseous lesions. Moderate lumbar spondylosis. IMPRESSION: 1. Stable post treatment change at the right lung apex with no evidence of local tumor recurrence. 2. No findings highly suspicious for metastatic disease in the chest, abdomen or pelvis.  3. New 4 mm subpleural right middle lobe pulmonary nodule, appearing somewhat linear on coronal reformat, favoring a benign tiny focus of atelectasis. Suggest attention on follow-up chest CT in 3-6 months. 4. Previously described vague 0.6 cm ground-glass pulmonary nodule in the posterior right lower lobe is stable. 5. Aortic Atherosclerosis (ICD10-I70.0) and Emphysema (ICD10-J43.9).  Electronically Signed   By: Ilona Sorrel M.D.   On: 11/04/2020 11:31   CT Abdomen Pelvis W Contrast  Result Date: 11/04/2020 CLINICAL DATA:  Primary Cancer Type: Lung Imaging Indication: Assess response to therapy Interval therapy since last imaging? Yes Initial Cancer Diagnosis Date: 08/30/2013; Established by: Biopsy-proven Detailed Pathology: Stage IV non-small cell lung cancer, adenocarcinoma. Primary Tumor location: Right upper lobe. Solitary brain metastasis. Surgeries: Sigmoid colectomy 11/03/2013; colostomy closure 07/10/2014. Hysterectomy. Chemotherapy: Yes; Ongoing? Yes; Most recent administration: 11/02/2020 Immunotherapy? No Radiation therapy? Yes Date Range: 10/28/2013-12/05/2013; Target: Right lung Date Range: 10/16/2013; Target: Right parietal brain lesion Other Cancers: Cervical cancer EXAM: CT CHEST, ABDOMEN, AND PELVIS WITH CONTRAST TECHNIQUE: Multidetector CT imaging of the chest, abdomen and pelvis was performed following the standard protocol during bolus administration of intravenous contrast. CONTRAST:  187m ISOVUE-300 IOPAMIDOL (ISOVUE-300) INJECTION 61% COMPARISON:  Most recent CT chest, abdomen and pelvis 08/27/2020. 09/09/2013 PET-CT. FINDINGS: CT CHEST FINDINGS Cardiovascular: Normal heart size. No significant pericardial effusion/thickening. Atherosclerotic nonaneurysmal thoracic aorta. Normal caliber pulmonary arteries. No central pulmonary emboli. Mediastinum/Nodes: Subcentimeter hypodense posterior left thyroid nodule is stable. Not clinically significant; no follow-up imaging recommended (ref: J Am Coll Radiol. 2015 Feb;12(2): 143-50). Unremarkable esophagus. No pathologically enlarged axillary, mediastinal or hilar lymph nodes. Lungs/Pleura: No pneumothorax. No pleural effusion. Moderate centrilobular emphysema with diffuse bronchial wall thickening. Sharply marginated patchy consolidation in medial apical right upper lobe with associated volume loss, unchanged, compatible with  radiation fibrosis. No acute consolidative airspace disease. New subpleural 4 mm nodule in the right middle lobe appearing somewhat linear on coronal reformat (series 4/image 104). Previously described vague 0.6 cm ground-glass pulmonary nodule in the posterior right lower lobe (series 4/image 96), stable. No additional significant pulmonary nodules. Musculoskeletal: No aggressive appearing focal osseous lesions. Moderate thoracic spondylosis. CT ABDOMEN PELVIS FINDINGS Hepatobiliary: Normal liver with no liver mass. Normal gallbladder with no radiopaque cholelithiasis. No biliary ductal dilatation. Pancreas: Normal, with no mass or duct dilation. Spleen: Normal size. No mass. Adrenals/Urinary Tract: Normal adrenals. No overt hydronephrosis. Stable subcentimeter hypodense upper left renal lesion, too small to characterize, requiring no further evaluation. Otherwise no renal masses. Normal bladder. Stomach/Bowel: Normal non-distended stomach. Normal caliber small bowel with no small bowel wall thickening. Normal appendix. Oral contrast transits to the colon. Stable postsurgical changes from subtotal distal colectomy with intact appearing distal colonic anastomosis. No large bowel wall thickening or significant pericolonic fat stranding. Vascular/Lymphatic: Atherosclerotic nonaneurysmal abdominal aorta. Patent portal, splenic, hepatic and renal veins. No pathologically enlarged lymph nodes in the abdomen or pelvis. Reproductive: Status post hysterectomy, with no abnormal findings at the vaginal cuff. No adnexal mass. Other: No pneumoperitoneum, ascites or focal fluid collection. Musculoskeletal: No aggressive appearing focal osseous lesions. Moderate lumbar spondylosis. IMPRESSION: 1. Stable post treatment change at the right lung apex with no evidence of local tumor recurrence. 2. No findings highly suspicious for metastatic disease in the chest, abdomen or pelvis. 3. New 4 mm subpleural right middle lobe pulmonary  nodule, appearing somewhat linear on coronal reformat, favoring a benign tiny focus of atelectasis. Suggest attention on follow-up chest CT in 3-6 months. 4. Previously described vague 0.6 cm ground-glass  pulmonary nodule in the posterior right lower lobe is stable. 5. Aortic Atherosclerosis (ICD10-I70.0) and Emphysema (ICD10-J43.9). Electronically Signed   By: Ilona Sorrel M.D.   On: 11/04/2020 11:31    ASSESSMENT AND PLAN:  This is a very pleasant 70 years old white female with metastatic non-small cell lung cancer, adenocarcinoma status post induction systemic chemotherapy with carboplatin and Alimta with partial response. The patient is currently on maintenance treatment with single agent Alimta status post 112 cycles. The patient continues to tolerate this treatment well with no concerning adverse effects. She had repeat CT scan of the chest, abdomen pelvis performed recently.  I personally and independently reviewed the scan and discussed the results with the patient today. Her scan showed no concerning findings for disease recurrence or metastasis except for new 4 mm subpleural right middle lobe pulmonary nodule likely benign but need close attention on follow-up imaging studies. I recommended for the patient to proceed with cycle 113 of her maintenance therapy today as planned. For the hypertension, she was advised to take her blood pressure medication as prescribed and to monitor it closely at home. For the diabetes mellitus, she is currently on Metformin. For the anxiety she will continue on Xanax on as-needed basis. The patient will come back for follow-up visit in 3 weeks for evaluation before cycle #114 of her treatment. She was advised to call immediately if she has any concerning symptoms in the interval.  The patient voices understanding of current disease status and treatment options and is in agreement with the current care plan. All questions were answered. The patient knows to  call the clinic with any problems, questions or concerns. We can certainly see the patient much sooner if necessary.  Disclaimer: This note was dictated with voice recognition software. Similar sounding words can inadvertently be transcribed and may not be corrected upon review.

## 2020-11-23 NOTE — Patient Instructions (Signed)
[facility] Discharge Instructions for Patients Receiving Chemotherapy  Today you received the following chemotherapy agents: Pemetrexed (Alimta)  To help prevent nausea and vomiting after your treatment, we encourage you to take your nausea medication  as prescribed.    If you develop nausea and vomiting that is not controlled by your nausea medication, call the clinic.   BELOW ARE SYMPTOMS THAT SHOULD BE REPORTED IMMEDIATELY:  *FEVER GREATER THAN 100.5 F  *CHILLS WITH OR WITHOUT FEVER  NAUSEA AND VOMITING THAT IS NOT CONTROLLED WITH YOUR NAUSEA MEDICATION  *UNUSUAL SHORTNESS OF BREATH  *UNUSUAL BRUISING OR BLEEDING  TENDERNESS IN MOUTH AND THROAT WITH OR WITHOUT PRESENCE OF ULCERS  *URINARY PROBLEMS  *BOWEL PROBLEMS  UNUSUAL RASH Items with * indicate a potential emergency and should be followed up as soon as possible.  Feel free to call the clinic should you have any questions or concerns. The clinic phone number is [phone].  Please show the [facility] at check-in to the Emergency Department and triage nurse.

## 2020-11-30 ENCOUNTER — Other Ambulatory Visit: Payer: Self-pay

## 2020-11-30 DIAGNOSIS — C3411 Malignant neoplasm of upper lobe, right bronchus or lung: Secondary | ICD-10-CM

## 2020-11-30 MED ORDER — OMEPRAZOLE 20 MG PO CPDR: DELAYED_RELEASE_CAPSULE | ORAL | 2 refills | Status: DC

## 2020-12-01 ENCOUNTER — Other Ambulatory Visit: Payer: Self-pay | Admitting: Physician Assistant

## 2020-12-01 DIAGNOSIS — C3490 Malignant neoplasm of unspecified part of unspecified bronchus or lung: Secondary | ICD-10-CM

## 2020-12-07 NOTE — Progress Notes (Signed)
[place] OFFICE PROGRESS NOTE  Alexis [name], [place] [address]  DIAGNOSIS: Stage IV (T2a, N0, M1b) non-small cell lung cancer, adenocarcinoma with negative EGFR and ALK mutations diagnosed in January 2015 and presented with right upper lobe lung mass in addition to a solitary brain metastasis  PRIOR THERAPY: 1) Status post stereotactic radiotherapy to a solitary right parietal brain lesion under the care of Dr. [name] on 10/16/2013. 2) Status post palliative radiotherapy to the right lung tumor under the care of Dr. [name] completed on 12/05/2013. 3) Systemic chemotherapy with carboplatin for AUC of 5 and Alimta 500 mg/M2 every 3 weeks. First dose Jan 06 2014. Status post 6 cycles.  CURRENT THERAPY:  Systemic chemotherapy with maintenance Alimta 500 MG/M2 every 3 weeks, status post113cycles.  INTERVAL HISTORY: Alexis [name] 70 y.o. female returns to the clinic today for a follow up visit. The patient is feeling"great" today without any concerning complaints. She had a cyst removed from her scalp last week which is healing well without swelling, erythema, drainage, or warmth. She is expected to get another cyst removed from her left jaw/cheek in a few weeks.The patient continues to tolerate treatment withsingle agent Alimtawell without any adverse effects except flushing from the steroids and feeling hot from the steroids. Denies anyfevers or chills.Denies any chest pain, cough, or hemoptysis.Denies significant or worsening shortness of breath. Denies any nausea, vomiting,constipation,or diarrhea.Denies any headache or visual changes. She is scheduled for a routine follow up brain MRI next month on 01/07/21. The patient is here today for evaluation prior to starting cycle #114  MEDICAL HISTORY: Past Medical History:  Diagnosis Date  . Anxiety   . Anxiety 06/20/2016  . Cervical cancer ([place])   . Diabetes mellitus without complication [facility])     patient states she has type 2  . Encounter for antineoplastic chemotherapy 07/20/2015  . 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  MEDICATIONS:  Current Outpatient Medications  Medication Sig Dispense Refill  . acetaminophen (TYLENOL) 500 MG tablet Take 500 mg by mouth every 6 (six) hours as needed for mild pain or headache. Reported on 11/02/2015    . ALPRAZolam (XANAX) 1 MG tablet SMARTSIG:1 Tablet(s) By Mouth 1 to 3 Times Daily PRN    . Ascorbic Acid (VITAMIN C GUMMIE PO) Take 1 each by mouth every morning.    Marland Kitchen aspirin EC 81 MG tablet Take 1 tablet (81 mg total) by mouth daily. 150 tablet 2  . [employer] ACID CONTROLLER 10 MG tablet SMARTSIG:1 Tablet(s) By Mouth Every 12 Hours PRN    . dexamethasone (DECADRON) 4 MG tablet Take 1 tablet twice a day the day before, the day of, and the day after chemotherapy. 40 tablet 2  . folic acid (FOLVITE) 1 MG tablet TAKE 1 TABLET BY MOUTH EVERY DAY 90 tablet 0  . HYDROcodone-acetaminophen (NORCO/VICODIN) 5-325 MG tablet TAKE 1 TABLET BY MOUTH 3 TIMES A DAY FOR 5 DAYS AS NEEDED FOR PAIN    . loratadine (CLARITIN) 10 MG tablet Take 10 mg by mouth daily.    . metFORMIN (GLUCOPHAGE) 500 MG tablet Take 500 mg by mouth 2 (two) times daily.    . Multiple Vitamin (MULTIVITAMIN WITH MINERALS) TABS tablet Take 1 tablet by mouth every morning.    Marland Kitchen omeprazole (PRILOSEC) 20 MG capsule TAKE  1 CAPSULE BY MOUTH EVERY DAY 30 capsule 2  . ondansetron (ZOFRAN) 8 MG tablet TAKE 1 TABLET BY MOUTH BEFORE CHEMO 30 tablet 0  . OVER THE COUNTER MEDICATION Take 1 tablet by mouth every morning. (Vitamin A)    . prochlorperazine (COMPAZINE) 10 MG tablet Take 1 tablet (10 mg total) by mouth every 6 (six) hours as needed for nausea or vomiting. 60 tablet 0  . rosuvastatin (CRESTOR) 10 MG tablet Take 1 tablet (10 mg total) by mouth daily. 30 tablet 12  . senna-docusate  (SENOKOT-S) 8.6-50 MG tablet Take 1 tablet by mouth daily. 30 tablet prn  . valACYclovir (VALTREX) 1000 MG tablet Take 1,000 mg by mouth 3 (three) times daily.    . Vitamin D, Ergocalciferol, (DRISDOL) 1.25 MG (50000 UNIT) CAPS capsule Take 50,000 Units by mouth once a week.     No current facility-administered medications for this visit.   Facility-Administered Medications Ordered in Other Visits  Medication Dose Route Frequency Provider Last Rate Last Admin  . sodium chloride flush (NS) 0.9 % injection 10 mL  10 mL Intravenous PRN Curt Bears, MD      . sodium chloride flush (NS) 0.9 % injection 10 mL  10 mL Intravenous PRN Curt Bears, MD        SURGICAL HISTORY:  Past Surgical History:  Procedure Laterality Date  . ABDOMINAL HYSTERECTOMY    . COLOSTOMY TAKEDOWN N/A 07/10/2014   Procedure: LAPAROSCOPIC LYSIS OF ADHESIONS (90 MIN) LAPAROSCOPIC ASSISTED COLOSTOMY CLOSURE, RIGID PROCTOSIGMOIDOSCOPY;  Surgeon: Jackolyn Confer, MD;  Location: [facility];  Service: General;  Laterality: N/A;  . LAPAROTOMY N/A 11/03/2013   Procedure: EXPLORATORY LAPAROTOMY, DRAINAGE OF INTRA  ABDOMINAL ABSCESSES, MOBILIZATION OF SPLENIC FLEXURE, SIGMOID COLECTOMY WITH COLOSTOMY;  Surgeon: Odis Hollingshead, MD;  Location: [facility];  Service: General;  Laterality: N/A;  . VIDEO BRONCHOSCOPY Bilateral 08/30/2013   Procedure: VIDEO BRONCHOSCOPY WITH FLUORO;  Surgeon: Tanda Rockers, MD;  Location: [facility];  Service: Cardiopulmonary;  Laterality: Bilateral;    REVIEW OF SYSTEMS:   Review of Systems  Constitutional: Negative for appetite change, chills, fatigue, fever and unexpected weight change.  HENT:   Negative for mouth sores, nosebleeds, sore throat and trouble swallowing.   Eyes: Negative for eye problems and icterus.  Respiratory: Negative for cough, hemoptysis, shortness of breath and wheezing.   Cardiovascular: Negative for chest pain and leg swelling.  Gastrointestinal: Negative for abdominal  pain, constipation, diarrhea, nausea and vomiting.  Genitourinary: Negative for bladder incontinence, difficulty urinating, dysuria, frequency and hematuria.   Musculoskeletal: Negative for back pain, gait problem, neck pain and neck stiffness.  Skin: Negative for itching and rash.  Neurological: Negative for dizziness, extremity weakness, gait problem, headaches, light-headedness and seizures.  Hematological: Negative for adenopathy. Does not bruise/bleed easily.  Psychiatric/Behavioral: Negative for confusion, depression and sleep disturbance. The patient is not nervous/anxious.     PHYSICAL EXAMINATION:  Blood pressure 120/62, pulse 99, temperature 97.8 F (36.6 C), temperature source Tympanic, resp. rate 17, height 5' 4.5" (1.638 m), weight 173 lb 3.2 oz (78.6 kg), SpO2 100 %.  ECOG PERFORMANCE STATUS: 0  Physical Exam  Constitutional: Oriented to person, place, and time and well-developed, well-nourished, and in no distress. No distress.  HENT:  Head: Normocephalic and atraumatic.  Mouth/Throat: Oropharynx is clear and moist. No oropharyngeal exudate.  Eyes: Conjunctivae are normal. Right eye exhibits no discharge. Left eye exhibits no discharge. No scleral icterus.  Neck: Normal range of motion. Neck supple.  Cardiovascular: Normal rate, regular rhythm, normal heart sounds and intact distal pulses.   Pulmonary/Chest: Effort normal and breath sounds normal. No respiratory distress. No wheezes. No rales.  Abdominal: Soft. Bowel sounds are normal. Exhibits no distension and no mass. There is no tenderness.  Musculoskeletal: Normal range of motion. Exhibits no edema.  Lymphadenopathy:    No cervical adenopathy.  Neurological: Alert and oriented to person, place, and time. Exhibits normal muscle tone. Gait normal. Coordination normal.  Skin: Scabbed over small lesion in the left scalp. Skin is warm and dry. No rash noted. Not diaphoretic. No erythema. No pallor.  Psychiatric: Mood,  memory and judgment normal.  Vitals reviewed.  LABORATORY DATA: Lab Results  Component Value Date   WBC 8.0 12/14/2020   HGB 11.9 (L) 12/14/2020   HCT 37.4 12/14/2020   MCV 98.4 12/14/2020   PLT 321 12/14/2020      Chemistry      Component Value Date/Time   NA 141 12/14/2020 0837   NA 139 08/14/2017 0837   K 3.7 12/14/2020 0837   K 4.4 08/14/2017 0837   CL 104 12/14/2020 0837   CO2 25 12/14/2020 0837   CO2 24 08/14/2017 0837   BUN 15 12/14/2020 0837   BUN 13.3 08/14/2017 0837   CREATININE 0.79 12/14/2020 0837   CREATININE 0.8 08/14/2017 0837      Component Value Date/Time   CALCIUM 9.4 12/14/2020 0837   CALCIUM 9.3 08/14/2017 0837   ALKPHOS 68 12/14/2020 0837   ALKPHOS 107 08/14/2017 0837   AST 11 (L) 12/14/2020 0837   AST 12 08/14/2017 0837   ALT 7 12/14/2020 0837   ALT 12 08/14/2017 0837   BILITOT 0.5 12/14/2020 0837   BILITOT 0.37 08/14/2017 0837       RADIOGRAPHIC STUDIES:  No results found.   ASSESSMENT/PLAN:  This isa very pleasant 70 year old Caucasian female with stage IV non-small cell lung cancer, adenocarcinoma who presented with a right upper lobe lungmass in addition to a solitary brain metastatsis. She was diagnosed in January 2015.  The patient had completed induction systemic chemotherapy with carboplatin and Alimta with a partial response. The patient is currently being treated with single agentmaintenanceAlimta. She is status post113cycles.   Labs were reviewed.Recommend that she proceedwith cycle #114today as scheduled.  We will see her back for a follow up visit in 3 weeks for evaluation before starting cycle #115.   She will have her routine follow up brain MRI performed on 01/07/21 as scheduled.   The patient was advised to call immediately if she has any concerning symptoms in the interval. The patient voices understanding of current disease status and treatment options and is in agreement with the current care plan. All  questions were answered. The patient knows to call the clinic with any problems, questions or concerns. We can certainly see the patient much sooner if necessary       No orders of the defined types were placed in this encounter.    I spent 20-29 minutes in this encounter.   Silas Sedam L Murrel Freet, PA-C 12/14/20

## 2020-12-09 DIAGNOSIS — L720 Epidermal cyst: Secondary | ICD-10-CM | POA: Diagnosis not present

## 2020-12-14 ENCOUNTER — Inpatient Hospital Stay: Payer: PPO

## 2020-12-14 ENCOUNTER — Inpatient Hospital Stay: Payer: PPO | Attending: Internal Medicine

## 2020-12-14 ENCOUNTER — Encounter: Payer: Self-pay | Admitting: Physician Assistant

## 2020-12-14 ENCOUNTER — Inpatient Hospital Stay: Payer: PPO | Admitting: Physician Assistant

## 2020-12-14 ENCOUNTER — Other Ambulatory Visit: Payer: Self-pay

## 2020-12-14 VITALS — BP 120/62 | HR 99 | Temp 97.8°F | Resp 17 | Ht 64.5 in | Wt 173.2 lb

## 2020-12-14 DIAGNOSIS — C3411 Malignant neoplasm of upper lobe, right bronchus or lung: Secondary | ICD-10-CM | POA: Insufficient documentation

## 2020-12-14 DIAGNOSIS — Z5111 Encounter for antineoplastic chemotherapy: Secondary | ICD-10-CM | POA: Diagnosis not present

## 2020-12-14 DIAGNOSIS — C7931 Secondary malignant neoplasm of brain: Secondary | ICD-10-CM | POA: Insufficient documentation

## 2020-12-14 LAB — CBC WITH DIFFERENTIAL (CANCER CENTER ONLY)
Abs Immature Granulocytes: 0.08 10*3/uL — ABNORMAL HIGH (ref 0.00–0.07)
Basophils Absolute: 0 10*3/uL (ref 0.0–0.1)
Basophils Relative: 0 %
Eosinophils Absolute: 0 10*3/uL (ref 0.0–0.5)
Eosinophils Relative: 0 %
HCT: 37.4 % (ref 36.0–46.0)
Hemoglobin: 11.9 g/dL — ABNORMAL LOW (ref 12.0–15.0)
Immature Granulocytes: 1 %
Lymphocytes Relative: 6 %
Lymphs Abs: 0.5 10*3/uL — ABNORMAL LOW (ref 0.7–4.0)
MCH: 31.3 pg (ref 26.0–34.0)
MCHC: 31.8 g/dL (ref 30.0–36.0)
MCV: 98.4 fL (ref 80.0–100.0)
Monocytes Absolute: 0.7 10*3/uL (ref 0.1–1.0)
Monocytes Relative: 9 %
Neutro Abs: 6.8 10*3/uL (ref 1.7–7.7)
Neutrophils Relative %: 84 %
Platelet Count: 321 10*3/uL (ref 150–400)
RBC: 3.8 MIL/uL — ABNORMAL LOW (ref 3.87–5.11)
RDW: 14.7 % (ref 11.5–15.5)
WBC Count: 8 10*3/uL (ref 4.0–10.5)
nRBC: 0 % (ref 0.0–0.2)

## 2020-12-14 LAB — CMP (CANCER CENTER ONLY)
ALT: 7 U/L (ref 0–44)
AST: 11 U/L — ABNORMAL LOW (ref 15–41)
Albumin: 3.5 g/dL (ref 3.5–5.0)
Alkaline Phosphatase: 68 U/L (ref 38–126)
Anion gap: 12 (ref 5–15)
BUN: 15 mg/dL (ref 8–23)
CO2: 25 mmol/L (ref 22–32)
Calcium: 9.4 mg/dL (ref 8.9–10.3)
Chloride: 104 mmol/L (ref 98–111)
Creatinine: 0.79 mg/dL (ref 0.44–1.00)
GFR, Estimated: >60 mL/min (ref >60)
Glucose, Bld: 163 mg/dL — ABNORMAL HIGH (ref 70–99)
Potassium: 3.7 mmol/L (ref 3.5–5.1)
Sodium: 141 mmol/L (ref 135–145)
Total Bilirubin: 0.5 mg/dL (ref 0.3–1.2)
Total Protein: 6.8 g/dL (ref 6.5–8.1)

## 2020-12-14 MED ORDER — SODIUM CHLORIDE 0.9 % IV SOLN
Freq: Once | INTRAVENOUS | Status: AC
  Filled 2020-12-14: qty 250

## 2020-12-14 MED ORDER — SODIUM CHLORIDE 0.9 % IV SOLN
10.0000 mg | Freq: Once | INTRAVENOUS | Status: AC
  Administered 2020-12-14: 10 mg via INTRAVENOUS
  Filled 2020-12-14: qty 10

## 2020-12-14 MED ORDER — SODIUM CHLORIDE 0.9 % IV SOLN
500.0000 mg/m2 | Freq: Once | INTRAVENOUS | Status: AC
  Administered 2020-12-14: 900 mg via INTRAVENOUS
  Filled 2020-12-14: qty 20

## 2020-12-14 NOTE — Patient Instructions (Signed)
[place] Discharge Instructions for Patients Receiving Chemotherapy  Today you received the following chemotherapy agents: Pemetrexed (Alimta)  To help prevent nausea and vomiting after your treatment, we encourage you to take your nausea medication  as prescribed.    If you develop nausea and vomiting that is not controlled by your nausea medication, call the clinic.   BELOW ARE SYMPTOMS THAT SHOULD BE REPORTED IMMEDIATELY:  *FEVER GREATER THAN 100.5 F  *CHILLS WITH OR WITHOUT FEVER  NAUSEA AND VOMITING THAT IS NOT CONTROLLED WITH YOUR NAUSEA MEDICATION  *UNUSUAL SHORTNESS OF BREATH  *UNUSUAL BRUISING OR BLEEDING  TENDERNESS IN MOUTH AND THROAT WITH OR WITHOUT PRESENCE OF ULCERS  *URINARY PROBLEMS  *BOWEL PROBLEMS  UNUSUAL RASH Items with * indicate a potential emergency and should be followed up as soon as possible.  Feel free to call the clinic should you have any questions or concerns. The clinic phone number is [phone].  Please show the [place] at check-in to the Emergency Department and triage nurse.

## 2020-12-28 DIAGNOSIS — L720 Epidermal cyst: Secondary | ICD-10-CM | POA: Diagnosis not present

## 2021-01-04 ENCOUNTER — Encounter: Payer: Self-pay | Admitting: Internal Medicine

## 2021-01-04 ENCOUNTER — Other Ambulatory Visit: Payer: Self-pay

## 2021-01-04 ENCOUNTER — Inpatient Hospital Stay: Payer: PPO

## 2021-01-04 ENCOUNTER — Other Ambulatory Visit: Payer: PPO

## 2021-01-04 ENCOUNTER — Inpatient Hospital Stay: Payer: PPO | Attending: Internal Medicine | Admitting: Internal Medicine

## 2021-01-04 VITALS — BP 99/80 | HR 107 | Temp 97.2°F | Resp 19 | Wt 171.5 lb

## 2021-01-04 DIAGNOSIS — Z5112 Encounter for antineoplastic immunotherapy: Secondary | ICD-10-CM | POA: Insufficient documentation

## 2021-01-04 DIAGNOSIS — R300 Dysuria: Secondary | ICD-10-CM

## 2021-01-04 DIAGNOSIS — C7931 Secondary malignant neoplasm of brain: Secondary | ICD-10-CM | POA: Diagnosis not present

## 2021-01-04 DIAGNOSIS — Z923 Personal history of irradiation: Secondary | ICD-10-CM | POA: Insufficient documentation

## 2021-01-04 DIAGNOSIS — Z9221 Personal history of antineoplastic chemotherapy: Secondary | ICD-10-CM | POA: Diagnosis not present

## 2021-01-04 DIAGNOSIS — C3411 Malignant neoplasm of upper lobe, right bronchus or lung: Secondary | ICD-10-CM | POA: Diagnosis not present

## 2021-01-04 DIAGNOSIS — Z79899 Other long term (current) drug therapy: Secondary | ICD-10-CM | POA: Insufficient documentation

## 2021-01-04 LAB — URINALYSIS, COMPLETE (UACMP) WITH MICROSCOPIC
Bilirubin Urine: NEGATIVE
Glucose, UA: NEGATIVE mg/dL
Ketones, ur: 5 mg/dL — AB
Nitrite: NEGATIVE
Protein, ur: NEGATIVE mg/dL
Specific Gravity, Urine: 1.015 (ref 1.005–1.030)
pH: 6 (ref 5.0–8.0)

## 2021-01-04 LAB — COMPREHENSIVE METABOLIC PANEL
ALT: 9 U/L (ref 0–44)
AST: 12 U/L — ABNORMAL LOW (ref 15–41)
Albumin: 3.6 g/dL (ref 3.5–5.0)
Alkaline Phosphatase: 72 U/L (ref 38–126)
Anion gap: 15 (ref 5–15)
BUN: 11 mg/dL (ref 8–23)
CO2: 25 mmol/L (ref 22–32)
Calcium: 9.3 mg/dL (ref 8.9–10.3)
Chloride: 103 mmol/L (ref 98–111)
Creatinine, Ser: 0.8 mg/dL (ref 0.44–1.00)
GFR, Estimated: >60 mL/min (ref >60)
Glucose, Bld: 155 mg/dL — ABNORMAL HIGH (ref 70–99)
Potassium: 3.7 mmol/L (ref 3.5–5.1)
Sodium: 143 mmol/L (ref 135–145)
Total Bilirubin: 0.4 mg/dL (ref 0.3–1.2)
Total Protein: 6.9 g/dL (ref 6.5–8.1)

## 2021-01-04 LAB — CBC WITH DIFFERENTIAL/PLATELET
Abs Immature Granulocytes: 0.06 10*3/uL (ref 0.00–0.07)
Basophils Absolute: 0 10*3/uL (ref 0.0–0.1)
Basophils Relative: 0 %
Eosinophils Absolute: 0 10*3/uL (ref 0.0–0.5)
Eosinophils Relative: 0 %
HCT: 37.3 % (ref 36.0–46.0)
Hemoglobin: 11.7 g/dL — ABNORMAL LOW (ref 12.0–15.0)
Immature Granulocytes: 1 %
Lymphocytes Relative: 5 %
Lymphs Abs: 0.4 10*3/uL — ABNORMAL LOW (ref 0.7–4.0)
MCH: 31.2 pg (ref 26.0–34.0)
MCHC: 31.4 g/dL (ref 30.0–36.0)
MCV: 99.5 fL (ref 80.0–100.0)
Monocytes Absolute: 0.5 10*3/uL (ref 0.1–1.0)
Monocytes Relative: 6 %
Neutro Abs: 7.4 10*3/uL (ref 1.7–7.7)
Neutrophils Relative %: 88 %
Platelets: 358 10*3/uL (ref 150–400)
RBC: 3.75 MIL/uL — ABNORMAL LOW (ref 3.87–5.11)
RDW: 15.1 % (ref 11.5–15.5)
WBC: 8.3 10*3/uL (ref 4.0–10.5)
nRBC: 0 % (ref 0.0–0.2)

## 2021-01-04 MED ORDER — SODIUM CHLORIDE 0.9 % IV SOLN
Freq: Once | INTRAVENOUS | Status: AC
  Filled 2021-01-04: qty 250

## 2021-01-04 MED ORDER — SODIUM CHLORIDE 0.9 % IV SOLN
500.0000 mg/m2 | Freq: Once | INTRAVENOUS | Status: AC
  Administered 2021-01-04: 900 mg via INTRAVENOUS
  Filled 2021-01-04: qty 16

## 2021-01-04 MED ORDER — SODIUM CHLORIDE 0.9 % IV SOLN
10.0000 mg | Freq: Once | INTRAVENOUS | Status: AC
  Administered 2021-01-04: 10 mg via INTRAVENOUS
  Filled 2021-01-04: qty 10

## 2021-01-04 NOTE — Progress Notes (Signed)
    [facility] [phone]   [phone]  OFFICE PROGRESS NOTE  Gibson, Heather C, FNP [address] [place] [address]  DIAGNOSIS: Stage IV (T2a, N0, M1b) non-small cell lung cancer, adenocarcinoma with negative EGFR and ALK mutations diagnosed in January 2015 and presented with right upper lobe lung mass in addition to a solitary brain metastasis.  PRIOR THERAPY: 1) Status post stereotactic radiotherapy to a solitary right parietal brain lesion under the care of Dr. Moody on 10/16/2013. 2) Status post palliative radiotherapy to the right lung tumor under the care of Dr. Moody completed on 12/05/2013. 3) Systemic chemotherapy with carboplatin for AUC of 5 and Alimta 500 mg/M2 every 3 weeks. First dose Jan 06 2014. Status post 6 cycles.  CURRENT THERAPY: Systemic chemotherapy with maintenance Alimta 500 MG/M2 every 3 weeks, status post 114 cycles.  INTERVAL HISTORY: Alexis [name] 70 y.o. female returns to the clinic today for follow-up visit.  The patient is feeling fine today with no concerning complaints except for mild dysuria.  She had 2 cyst removed from the scalp and cheek recently.  She denied having any current chest pain, shortness of breath, cough or hemoptysis.  She denied having any fever or chills.  She has no nausea, vomiting, diarrhea or constipation.  She has no headache or visual changes.  She is here today for evaluation before starting cycle #115.  MEDICAL HISTORY: Past Medical History:  Diagnosis Date  . Anxiety   . Anxiety 06/20/2016  . Cervical cancer ([facility])   . Diabetes mellitus without complication ([facility]CC)    patient states she has type 2  . Encounter for antineoplastic chemotherapy 07/20/2015  . 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  MEDICATIONS:  Current Outpatient Medications  Medication Sig Dispense  Refill  . acetaminophen (TYLENOL) 500 MG tablet Take 500 mg by mouth every 6 (six) hours as needed for mild pain or headache. Reported on 11/02/2015    . ALPRAZolam (XANAX) 1 MG tablet SMARTSIG:1 Tablet(s) By Mouth 1 to 3 Times Daily PRN    . Ascorbic Acid (VITAMIN C GUMMIE PO) Take 1 each by mouth every morning.    . aspirin EC 81 MG tablet Take 1 tablet (81 mg total) by mouth daily. 150 tablet 2  . [employer] ACID CONTROLLER 10 MG tablet SMARTSIG:1 Tablet(s) By Mouth Every 12 Hours PRN    . dexamethasone (DECADRON) 4 MG tablet Take 1 tablet twice a day the day before, the day of, and the day after chemotherapy. 40 tablet 2  . folic acid (FOLVITE) 1 MG tablet TAKE 1 TABLET BY MOUTH EVERY DAY 90 tablet 0  . HYDROcodone-acetaminophen (NORCO/VICODIN) 5-325 MG tablet TAKE 1 TABLET BY MOUTH 3 TIMES A DAY FOR 5 DAYS AS NEEDED FOR PAIN    . loratadine (CLARITIN) 10 MG tablet Take 10 mg by mouth daily.    . metFORMIN (GLUCOPHAGE) 500 MG tablet Take 500 mg by mouth 2 (two) times daily.    . Multiple Vitamin (MULTIVITAMIN WITH MINERALS) TABS tablet Take 1 tablet by mouth every morning.    . omeprazole (PRILOSEC) 20 MG capsule TAKE 1 CAPSULE BY MOUTH EVERY DAY 30 capsule 2  . ondansetron (ZOFRAN) 8 MG tablet TAKE 1 TABLET BY MOUTH BEFORE CHEMO 30 tablet 0  . OVER THE COUNTER MEDICATION Take   1 tablet by mouth every morning. (Vitamin A)    . prochlorperazine (COMPAZINE) 10 MG tablet Take 1 tablet (10 mg total) by mouth every 6 (six) hours as needed for nausea or vomiting. 60 tablet 0  . rosuvastatin (CRESTOR) 10 MG tablet Take 1 tablet (10 mg total) by mouth daily. 30 tablet 12  . senna-docusate (SENOKOT-S) 8.6-50 MG tablet Take 1 tablet by mouth daily. 30 tablet prn  . valACYclovir (VALTREX) 1000 MG tablet Take 1,000 mg by mouth 3 (three) times daily.    . Vitamin D, Ergocalciferol, (DRISDOL) 1.25 MG (50000 UNIT) CAPS capsule Take 50,000 Units by mouth once a week.     No current facility-administered medications  for this visit.   Facility-Administered Medications Ordered in Other Visits  Medication Dose Route Frequency Provider Last Rate Last Admin  . sodium chloride flush (NS) 0.9 % injection 10 mL  10 mL Intravenous PRN [name], [name], MD      . sodium chloride flush (NS) 0.9 % injection 10 mL  10 mL Intravenous PRN [name], [name], MD        SURGICAL HISTORY:  Past Surgical History:  Procedure Laterality Date  . ABDOMINAL HYSTERECTOMY    . COLOSTOMY TAKEDOWN N/A 07/10/2014   Procedure: LAPAROSCOPIC LYSIS OF ADHESIONS (90 MIN) LAPAROSCOPIC ASSISTED COLOSTOMY CLOSURE, RIGID PROCTOSIGMOIDOSCOPY;  Surgeon: Todd Rosenbower, MD;  Location: [facility];  Service: General;  Laterality: N/A;  . LAPAROTOMY N/A 11/03/2013   Procedure: EXPLORATORY LAPAROTOMY, DRAINAGE OF INTRA  ABDOMINAL ABSCESSES, MOBILIZATION OF SPLENIC FLEXURE, SIGMOID COLECTOMY WITH COLOSTOMY;  Surgeon: Todd J Rosenbower, MD;  Location: [facility];  Service: General;  Laterality: N/A;  . VIDEO BRONCHOSCOPY Bilateral 08/30/2013   Procedure: VIDEO BRONCHOSCOPY WITH FLUORO;  Surgeon: Michael B Wert, MD;  Location: [facility];  Service: Cardiopulmonary;  Laterality: Bilateral;    REVIEW OF SYSTEMS:  A comprehensive review of systems was negative except for: Genitourinary: positive for dysuria   PHYSICAL EXAMINATION: General appearance: alert, cooperative and no distress Head: Normocephalic, without obvious abnormality, atraumatic Neck: no adenopathy, no JVD, supple, symmetrical, trachea midline and thyroid not enlarged, symmetric, no tenderness/mass/nodules Lymph nodes: Cervical, supraclavicular, and axillary nodes normal. Resp: clear to auscultation bilaterally Back: symmetric, no curvature. ROM normal. No CVA tenderness. Cardio: regular rate and rhythm, S1, S2 normal, no murmur, click, rub or gallop GI: soft, non-tender; bowel sounds normal; no masses,  no organomegaly Extremities: extremities normal, atraumatic, no cyanosis or edema   ECOG  PERFORMANCE STATUS: 0 - Asymptomatic  Blood pressure 99/80, pulse (!) 107, temperature (!) 97.2 F (36.2 C), temperature source Tympanic, resp. rate 19, weight 171 lb 8 oz (77.8 kg), SpO2 100 %.  LABORATORY DATA: Lab Results  Component Value Date   WBC 8.3 01/04/2021   HGB 11.7 (L) 01/04/2021   HCT 37.3 01/04/2021   MCV 99.5 01/04/2021   PLT 358 01/04/2021      Chemistry      Component Value Date/Time   NA 141 12/14/2020 0837   NA 139 08/14/2017 0837   K 3.7 12/14/2020 0837   K 4.4 08/14/2017 0837   CL 104 12/14/2020 0837   CO2 25 12/14/2020 0837   CO2 24 08/14/2017 0837   BUN 15 12/14/2020 0837   BUN 13.3 08/14/2017 0837   CREATININE 0.79 12/14/2020 0837   CREATININE 0.8 08/14/2017 0837      Component Value Date/Time   CALCIUM 9.4 12/14/2020 0837   CALCIUM 9.3 08/14/2017 0837   ALKPHOS 68 12/14/2020 0837     ALKPHOS 107 08/14/2017 0837   AST 11 (L) 12/14/2020 0837   AST 12 08/14/2017 0837   ALT 7 12/14/2020 0837   ALT 12 08/14/2017 0837   BILITOT 0.5 12/14/2020 0837   BILITOT 0.37 08/14/2017 0837       RADIOGRAPHIC STUDIES: No results found.  ASSESSMENT AND PLAN:  This is a very pleasant 70 years old white female with metastatic non-small cell lung cancer, adenocarcinoma status post induction systemic chemotherapy with carboplatin and Alimta with partial response. The patient is currently on maintenance treatment with single agent Alimta status post 114 cycles. The patient has been tolerating her treatment well with no concerning adverse effects. I recommended for her to proceed with cycle #115 today as planned. I will see her back for follow-up visit in 3 weeks for evaluation before the next cycle of her treatment. For the dysuria, I will arrange for the patient to have urinalysis and if there is any signs of urinary tract infection, I will start her on antibiotics. For the hypertension, she was advised to take her blood pressure medication as prescribed and to  monitor it closely at home. For the diabetes mellitus, she is currently on Metformin. For the anxiety she will continue on Xanax on as-needed basis. The patient will come back for follow-up visit in 3 weeks for evaluation before starting cycle #116. She was advised to call immediately if she has any concerning symptoms in the interval. The patient voices understanding of current disease status and treatment options and is in agreement with the current care plan. All questions were answered. The patient knows to call the clinic with any problems, questions or concerns. We can certainly see the patient much sooner if necessary.  Disclaimer: This note was dictated with voice recognition software. Similar sounding words can inadvertently be transcribed and may not be corrected upon review.       

## 2021-01-04 NOTE — Patient Instructions (Signed)
[facility] MEDICAL [facility]COLOG[facility]   °Discharge Instructions: °Thank you for choosing [facility] to provide your oncology and hematology care.  ° °If you have a lab appointment with the Cancer Center, please go directly to the Cancer Center and check in at the registration area. °  °Wear comfortable clothing and clothing appropriate for easy access to any Portacath or PICC line.  ° °We strive to give you quality time with your provider. You may need to reschedule your appointment if you arrive late (15 or more minutes).  Arriving late affects you and other patients whose appointments are after yours.  Also, if you miss three or more appointments without notifying the office, you may be dismissed from the clinic at the provider’s discretion.    °  °For prescription refill requests, have your pharmacy contact our office and allow 72 hours for refills to be completed.   ° °Today you received the following chemotherapy and/or immunotherapy agents: avastin  °  °To help prevent nausea and vomiting after your treatment, we encourage you to take your nausea medication as directed. ° °BELOW ARE SYMPTOMS THAT SHOULD BE REPORTED IMMEDIATELY: °*FEVER GREATER THAN 100.4 F (38 °C) OR HIGHER °*CHILLS OR SWEATING °*NAUSEA AND VOMITING THAT IS NOT CONTROLLED WITH YOUR NAUSEA MEDICATION °*UNUSUAL SHORTNESS OF BREATH °*UNUSUAL BRUISING OR BLEEDING °*URINARY PROBLEMS (pain or burning when urinating, or frequent urination) °*BOWEL PROBLEMS (unusual diarrhea, constipation, pain near the anus) °TENDERNESS IN MOUTH AND THROAT WITH OR WITHOUT PRESENCE OF ULCERS (sore throat, sores in mouth, or a toothache) °UNUSUAL RASH, SWELLING OR PAIN  °UNUSUAL VAGINAL DISCHARGE OR ITCHING  ° °Items with * indicate a potential emergency and should be followed up as soon as possible or go to the Emergency Department if any problems should occur. ° °Please show the CHEMOTHERAPY ALERT CARD or IMMUNOTHERAPY ALERT CARD at check-in to  the Emergency Department and triage nurse. ° °Should you have questions after your visit or need to cancel or reschedule your appointment, please contact [facility]  Dept: [phone]  and follow the prompts.  Office hours are 8:00 a.m. to 4:30 p.m. Monday - Friday. Please note that voicemails left after 4:00 p.m. may not be returned until the following business day.  We are closed weekends and major holidays. You have access to a nurse at all times for urgent questions. Please call the main number to the clinic Dept: [phone] and follow the prompts. ° ° °For any non-urgent questions, you may also contact your provider using MyChart. We now offer e-Visits for anyone 18 and older to request care online for non-urgent symptoms. For details visit [email or web address]. °  °Also download the MyChart app! Go to the app store, search "MyChart", open the app, select , and log in with your MyChart username and password. ° °Due to Covid, a mask is required upon entering the hospital/clinic. If you do not have a mask, one will be given to you upon arrival. For doctor visits, patients may have 1 support person aged 18 or older with them. For treatment visits, patients cannot have anyone with them due to current Covid guidelines and our immunocompromised population.  ° °

## 2021-01-04 NOTE — Progress Notes (Signed)
Per Dr Julien Nordmann ,it is ok to treat Alexis [name] today with Alimita and heart rate of 107.

## 2021-01-05 ENCOUNTER — Other Ambulatory Visit: Payer: Self-pay

## 2021-01-05 ENCOUNTER — Telehealth: Payer: Self-pay

## 2021-01-05 ENCOUNTER — Encounter: Payer: Self-pay | Admitting: Radiation Oncology

## 2021-01-05 NOTE — Telephone Encounter (Signed)
Spoke with patient about her appointment on Monday. Questions and concerns addressed.

## 2021-01-05 NOTE — Progress Notes (Signed)
Spoke with patient via phone . She denies any pain or complaints of she has a MRI on Thursday. States she had a cyst removed from her cheek last week and the one on her scalp about four weeks ago, both are healing well.Meaningful use complete.

## 2021-01-07 ENCOUNTER — Ambulatory Visit
Admission: RE | Admit: 2021-01-07 | Discharge: 2021-01-07 | Disposition: A | Discharge status: Home / Self Care | Payer: PPO | Source: Ambulatory Visit | Attending: Radiation Oncology | Admitting: Radiation Oncology

## 2021-01-07 ENCOUNTER — Other Ambulatory Visit: Payer: Self-pay

## 2021-01-07 DIAGNOSIS — C7931 Secondary malignant neoplasm of brain: Secondary | ICD-10-CM | POA: Diagnosis not present

## 2021-01-07 DIAGNOSIS — Z85118 Personal history of other malignant neoplasm of bronchus and lung: Secondary | ICD-10-CM | POA: Diagnosis not present

## 2021-01-07 DIAGNOSIS — J3489 Other specified disorders of nose and nasal sinuses: Secondary | ICD-10-CM | POA: Diagnosis not present

## 2021-01-07 DIAGNOSIS — J322 Chronic ethmoidal sinusitis: Secondary | ICD-10-CM | POA: Diagnosis not present

## 2021-01-07 MED ORDER — GADOBENATE DIMEGLUMINE 529 MG/ML IV SOLN
15.0000 mL | Freq: Once | INTRAVENOUS | Status: AC | PRN
  Administered 2021-01-07: 15 mL via INTRAVENOUS

## 2021-01-11 ENCOUNTER — Ambulatory Visit
Admission: RE | Admit: 2021-01-11 | Discharge: 2021-01-11 | Disposition: A | Discharge status: Home / Self Care | Payer: PPO | Source: Ambulatory Visit | Attending: Radiation Oncology | Admitting: Radiation Oncology

## 2021-01-11 ENCOUNTER — Inpatient Hospital Stay: Payer: PPO

## 2021-01-11 DIAGNOSIS — C3411 Malignant neoplasm of upper lobe, right bronchus or lung: Secondary | ICD-10-CM

## 2021-01-11 DIAGNOSIS — C7931 Secondary malignant neoplasm of brain: Secondary | ICD-10-CM | POA: Diagnosis not present

## 2021-01-11 DIAGNOSIS — Z85118 Personal history of other malignant neoplasm of bronchus and lung: Secondary | ICD-10-CM | POA: Diagnosis not present

## 2021-01-11 DIAGNOSIS — C7949 Secondary malignant neoplasm of other parts of nervous system: Secondary | ICD-10-CM

## 2021-01-11 DIAGNOSIS — Z08 Encounter for follow-up examination after completed treatment for malignant neoplasm: Secondary | ICD-10-CM | POA: Diagnosis not present

## 2021-01-11 NOTE — Progress Notes (Addendum)
Radiation Oncology         [phone]) [phone]  Outpatient Follow Up - Conducted via telephone due to current COVID-19 concerns for limiting patient exposure  I spoke with the patient to conduct this consult visit via telephone to spare the patient unnecessary potential exposure in the healthcare setting during the current COVID-19 pandemic. The patient was notified in advance and was offered a [place] meeting to allow for face to face communication but unfortunately reported that they did not have the appropriate resources/technology to support such a visit and instead preferred to proceed with a telephone discussion.  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  Interval Since Last Radiation: 7 years, one month  10/28/2013 through 12/05/2013:  The patient was treated to the right lung tumor to a dose of 50 gray in 25 fractions using a 3-D conformal technique. Daily image guidance was used for the patient's treatment.  10/16/2013 SRS Treatment: PTV1: Rt Parietal 22mm target was treated using 3 Arcs to a prescription dose of 20 Gy. ExacTrac Snap verification was performed for each couch angle.  Narrative:  The patient is contacted for routine follow-up. In summary this is a 70 y.o. patient with a history of metastatic lung cancer to the brain who was treated in two sessions in 2015 for her brain disease, followed by local control to the right lung.  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 She is no longer taking these medications.  She remains on maintenance Alimta with Dr. Julien Nordmann every 3 weeks and has had a total of 115 cycles to date. She had an MRI on 01/07/21 that did not  show any evidence of new or active disease in the brain. She's contacted today to discuss this result.  On review of systems, the patient reports that she is doing well overall. She was treated recently with antibiotics for a UTI and reports she is no longer having difficulty with dysuria. She denies breathing difficulties, chest pain, headaches, visual or auditory changes. No other complaints are noted.     Past Medical History:  Past Medical History:  Diagnosis Date  . Anxiety   . Anxiety 06/20/2016  . Cervical cancer ([place])   . Diabetes mellitus without complication [facility])    patient states she has type 2  . Encounter for antineoplastic chemotherapy 07/20/2015  . 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 ABDOMINAL HYSTERECTOMY    . COLOSTOMY TAKEDOWN N/A 07/10/2014   Procedure: LAPAROSCOPIC LYSIS OF ADHESIONS (90 MIN) LAPAROSCOPIC ASSISTED COLOSTOMY CLOSURE, RIGID PROCTOSIGMOIDOSCOPY;  Surgeon: Jackolyn Confer, MD;  Location: [facility];  Service: General;  Laterality: N/A;  . LAPAROTOMY N/A 11/03/2013   Procedure: EXPLORATORY LAPAROTOMY, DRAINAGE OF INTRA  ABDOMINAL ABSCESSES, MOBILIZATION OF SPLENIC FLEXURE, SIGMOID COLECTOMY WITH COLOSTOMY;  Surgeon: Odis Hollingshead, MD;  Location: [facility];  Service: General;  Laterality: N/A;  . VIDEO BRONCHOSCOPY Bilateral 08/30/2013   Procedure: VIDEO BRONCHOSCOPY WITH FLUORO;  Surgeon: Tanda Rockers, MD;  Location: [facility];  Service: Cardiopulmonary;  Laterality: Bilateral;  Social History:  Social History   Socioeconomic History  . Marital status: Married    Spouse name: Not on file  . Number of children: Not on file  . Years of education: Not on file  . Highest education level: Not on file  Occupational History  . 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  Smoking status: Former Smoker    Packs/day: 1.00    Years: 40.00    Pack years: 40.00    Types: Cigarettes    Quit date: 09/27/2013    Years since quitting: 7.2  . Smokeless tobacco: Never Used  Vaping Use  . Vaping Use: Never used  Substance and Sexual Activity  . Alcohol use: No  . Drug use: No  . Sexual activity: Yes  Other Topics Concern  . Not on file  Social History Narrative  . Not on file   Social Determinants of Health   Financial Resource Strain: Not on file  Food Insecurity: Not on file  Transportation Needs: Not on file  Physical Activity: Not on file  Stress: Not on file  Social Connections: Not on file  Intimate Partner Violence: Not on file  The patient is married. She lives in [place], [place].  Family History: Family History  Problem Relation Age of Onset  . Emphysema Father        smoked  . Lung cancer Father        smoked  . Cancer Mother   . Hypertension Mother   . COPD Mother     ALLERGIES:  is allergic to codeine.  Meds: Current Outpatient Medications  Medication Sig Dispense Refill  . acetaminophen (TYLENOL) 500 MG tablet Take 500 mg by mouth every 6 (six) hours as needed for mild pain or headache. Reported on 11/02/2015    . ALPRAZolam (XANAX) 1 MG tablet SMARTSIG:1 Tablet(s) By Mouth 1 to 3 Times Daily PRN    . Ascorbic Acid (VITAMIN C GUMMIE PO) Take 1 each by mouth every morning.    Marland Kitchen aspirin EC 81 MG tablet Take 1 tablet (81 mg total) by mouth daily. 150 tablet 2  . [employer] ACID CONTROLLER 10 MG tablet SMARTSIG:1 Tablet(s) By Mouth Every 12 Hours PRN    . dexamethasone (DECADRON) 4 MG tablet Take 1 tablet twice a day the day before, the day of, and the day after chemotherapy. 40 tablet 2  . folic acid (FOLVITE) 1 MG tablet TAKE 1 TABLET BY MOUTH EVERY DAY 90 tablet 0  . HYDROcodone-acetaminophen (NORCO/VICODIN) 5-325 MG tablet TAKE 1 TABLET BY MOUTH 3 TIMES A DAY FOR 5 DAYS AS NEEDED FOR PAIN    . loratadine (CLARITIN) 10 MG tablet  Take 10 mg by mouth daily.    . metFORMIN (GLUCOPHAGE) 500 MG tablet Take 500 mg by mouth 2 (two) times daily.    . Multiple Vitamin (MULTIVITAMIN WITH MINERALS) TABS tablet Take 1 tablet by mouth every morning.    Marland Kitchen omeprazole (PRILOSEC) 20 MG capsule TAKE 1 CAPSULE BY MOUTH EVERY DAY 30 capsule 2  . ondansetron (ZOFRAN) 8 MG tablet TAKE 1 TABLET BY MOUTH BEFORE CHEMO 30 tablet 0  . OVER THE COUNTER MEDICATION Take 1 tablet by mouth every morning. (Vitamin A)    . prochlorperazine (COMPAZINE) 10 MG tablet Take 1 tablet (10 mg total) by mouth every 6 (six) hours as needed for nausea or vomiting. 60 tablet 0  . rosuvastatin (CRESTOR) 10 MG tablet Take 1 tablet (10 mg total)  by mouth daily. 30 tablet 12  . senna-docusate (SENOKOT-S) 8.6-50 MG tablet Take 1 tablet by mouth daily. 30 tablet prn  . valACYclovir (VALTREX) 1000 MG tablet Take 1,000 mg by mouth 3 (three) times daily.    . Vitamin D, Ergocalciferol, (DRISDOL) 1.25 MG (50000 UNIT) CAPS capsule Take 50,000 Units by mouth once a week.     No current facility-administered medications for this encounter.   Facility-Administered Medications Ordered in Other Encounters  Medication Dose Route Frequency Provider Last Rate Last Admin  . sodium chloride flush (NS) 0.9 % injection 10 mL  10 mL Intravenous PRN Curt Bears, MD      . sodium chloride flush (NS) 0.9 % injection 10 mL  10 mL Intravenous PRN Curt Bears, MD        Physical Findings: Unable to assess due to type of encounter  Lab Findings: Lab Results  Component Value Date   WBC 8.3 01/04/2021   HGB 11.7 (L) 01/04/2021   HCT 37.3 01/04/2021   MCV 99.5 01/04/2021   PLT 358 01/04/2021     Radiographic Findings: MR Brain W Wo Contrast  Result Date: 01/08/2021 CLINICAL DATA:  Follow-up metastatic non-small cell lung cancer. Right parietal metastasis treated with SRS in 2015. EXAM: MRI HEAD WITHOUT AND WITH CONTRAST TECHNIQUE: Multiplanar, multiecho pulse sequences of  the brain and surrounding structures were obtained without and with intravenous contrast. CONTRAST:  15mL MULTIHANCE GADOBENATE DIMEGLUMINE 529 MG/ML IV SOLN COMPARISON:  07/09/2020 FINDINGS: Brain: New lesions: None. Larger lesions: None. Stable or smaller lesions: 1. 1.6 x 1.4 cm heterogeneously enhancing treated lesion in the right parietal lobe with associated chronic blood products and mild surrounding T2 hyperintensity, unchanged (series 11, image 112). Other brain findings: There is no evidence of an acute infarct, midline shift, or extra-axial fluid collection. Patchy T2 hyperintensities elsewhere in the cerebral white matter bilaterally and in the pons are unchanged and nonspecific but may reflect a combination of chronic small vessel ischemic disease and post treatment changes. The ventricles are normal in size for age. A partially empty sella is unchanged. A small developmental venous anomaly is noted in the right frontal lobe. Vascular: Major intracranial vascular flow voids are preserved. Skull and upper cervical spine: No suspicious marrow lesion. Sinuses/Orbits: Bilateral cataract extraction. Minimal left ethmoid air cell mucosal thickening and trace bilateral mastoid fluid. Other: None. IMPRESSION: 1. Unchanged treated right parietal metastasis. 2. No evidence of new intracranial metastases. Electronically Signed   By: Logan Bores M.D.   On: 01/08/2021 09:46    Impression/Plan: 1. 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 The patient is extraordinary and remains without disease in the brain. We will follow up with her per NCCN guidelines for repeat MRI imaging q 6 months indefinitely.  She is in agreement with this plan, and will continue with single agent Alimta with Dr. Julien Nordmann.   Given current concerns for patient exposure during the COVID-19 pandemic, this encounter was conducted via telephone.   The patient has given verbal consent for this type of encounter. The time spent during this encounter was 30 minutes and 50% of that time was spent in the coordination of his care. The attendants for this meeting include Shona Simpson, [facility] and [name] and her husband Youlanda Tomassetti.  During the encounter, Shona Simpson [facility] was located at [facility] Radiation Oncology Department.  [name]  was located at home with  her husband Mckinsey Keagle.     Carola Rhine, PAC

## 2021-01-11 NOTE — Addendum Note (Signed)
Encounter addended by: Hayden Pedro, PA-C on: 01/11/2021 2:25 PM  Actions taken: Clinical Note Signed

## 2021-01-20 NOTE — Progress Notes (Signed)
[place] OFFICE PROGRESS NOTE  Alexis [name], [place] [address]  DIAGNOSIS: Stage IV (T2a, N0, M1b) non-small cell lung cancer, adenocarcinoma with negative EGFR and ALK mutations diagnosed in January 2015 and presented with right upper lobe lung mass in addition to a solitary brain metastasis  PRIOR THERAPY: 1) Status post stereotactic radiotherapy to a solitary right parietal brain lesion under the care of Dr. [name] on 10/16/2013. 2) Status post palliative radiotherapy to the right lung tumor under the care of Dr. [name] completed on 12/05/2013. 3) Systemic chemotherapy with carboplatin for AUC of 5 and Alimta 500 mg/M2 every 3 weeks. First dose Jan 06 2014. Status post 6 cycles.  CURRENT THERAPY: Systemic chemotherapy with maintenance Alimta 500 MG/M2 every 3 weeks, status post115cycles.  INTERVAL HISTORY: Alexis [name] 70 y.o. female returns to the clinic today for a follow up visit. The patient is feelinggood today without any concerning complaints except in the interval she had a urinary tract infection. She saw her PCP and was treated with anti-biotics. She also received some type of external cream to help with burning. She had an episode of burning this weekend that resolved with using the cream. The patient continues to tolerate treatment withsingle agent Alimtawell without any adverse effects except flushing from the steroidsand feeling hot from the steroids. Denies anyfeversor chills.Denies any chest pain, cough, or hemoptysis.Denies significant or worsening shortness of breath.Denies any nausea, vomiting,constipation,or diarrhea.Denies any headache or visual changes. She recently had a restaging brain MRI and follow up with radiation oncology to review the results which was negative for new metastatic disease. The patient is here today for evaluation before starting cycle #116.   MEDICAL HISTORY: Past Medical History:   Diagnosis Date  . Anxiety   . Anxiety 06/20/2016  . Cervical cancer ([place])   . Diabetes mellitus without complication [facility])    patient states she has type 2  . Encounter for antineoplastic chemotherapy 07/20/2015  . 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  MEDICATIONS:  Current Outpatient Medications  Medication Sig Dispense Refill  . acetaminophen (TYLENOL) 500 MG tablet Take 500 mg by mouth every 6 (six) hours as needed for mild pain or headache. Reported on 11/02/2015    . ALPRAZolam (XANAX) 1 MG tablet SMARTSIG:1 Tablet(s) By Mouth 1 to 3 Times Daily PRN    . Ascorbic Acid (VITAMIN C GUMMIE PO) Take 1 each by mouth every morning.    Marland Kitchen aspirin EC 81 MG tablet Take 1 tablet (81 mg total) by mouth daily. 150 tablet 2  . [employer] ACID CONTROLLER 10 MG tablet SMARTSIG:1 Tablet(s) By Mouth Every 12 Hours PRN    . dexamethasone (DECADRON) 4 MG tablet Take 1 tablet twice a day the day before, the day of, and the day after chemotherapy. 40 tablet 2  . folic acid (FOLVITE) 1 MG tablet TAKE 1 TABLET BY MOUTH EVERY DAY 90 tablet 0  . HYDROcodone-acetaminophen (NORCO/VICODIN) 5-325 MG tablet TAKE 1 TABLET BY MOUTH 3 TIMES A DAY FOR 5 DAYS AS NEEDED FOR PAIN    . loratadine (CLARITIN) 10 MG tablet Take 10 mg by mouth daily.    . metFORMIN (GLUCOPHAGE) 500 MG tablet Take 500 mg by mouth 2 (two) times daily.    . Multiple Vitamin (MULTIVITAMIN WITH MINERALS) TABS tablet Take  1 tablet by mouth every morning.    . omeprazole (PRILOSEC) 20 MG capsule TAKE 1 CAPSULE BY MOUTH EVERY DAY 30 capsule 2  . ondansetron (ZOFRAN) 8 MG tablet TAKE 1 TABLET BY MOUTH BEFORE CHEMO 30 tablet 0  . OVER THE COUNTER MEDICATION Take 1 tablet by mouth every morning. (Vitamin A)    . prochlorperazine (COMPAZINE) 10 MG tablet Take 1 tablet (10 mg total) by mouth every 6 (six) hours as needed for nausea or vomiting. 60  tablet 0  . rosuvastatin (CRESTOR) 10 MG tablet Take 1 tablet (10 mg total) by mouth daily. 30 tablet 12  . senna-docusate (SENOKOT-S) 8.6-50 MG tablet Take 1 tablet by mouth daily. 30 tablet prn  . valACYclovir (VALTREX) 1000 MG tablet Take 1,000 mg by mouth 3 (three) times daily.    . Vitamin D, Ergocalciferol, (DRISDOL) 1.25 MG (50000 UNIT) CAPS capsule Take 50,000 Units by mouth once a week.     No current facility-administered medications for this visit.   Facility-Administered Medications Ordered in Other Visits  Medication Dose Route Frequency Provider Last Rate Last Admin  . sodium chloride flush (NS) 0.9 % injection 10 mL  10 mL Intravenous PRN [name], [name], MD      . sodium chloride flush (NS) 0.9 % injection 10 mL  10 mL Intravenous PRN [name], [name], MD        SURGICAL HISTORY:  Past Surgical History:  Procedure Laterality Date  . ABDOMINAL HYSTERECTOMY    . COLOSTOMY TAKEDOWN N/A 07/10/2014   Procedure: LAPAROSCOPIC LYSIS OF ADHESIONS (90 MIN) LAPAROSCOPIC ASSISTED COLOSTOMY CLOSURE, RIGID PROCTOSIGMOIDOSCOPY;  Surgeon: Todd Rosenbower, MD;  Location: [facility];  Service: General;  Laterality: N/A;  . LAPAROTOMY N/A 11/03/2013   Procedure: EXPLORATORY LAPAROTOMY, DRAINAGE OF INTRA  ABDOMINAL ABSCESSES, MOBILIZATION OF SPLENIC FLEXURE, SIGMOID COLECTOMY WITH COLOSTOMY;  Surgeon: Todd J Rosenbower, MD;  Location: [facility];  Service: General;  Laterality: N/A;  . VIDEO BRONCHOSCOPY Bilateral 08/30/2013   Procedure: VIDEO BRONCHOSCOPY WITH FLUORO;  Surgeon: Michael B Wert, MD;  Location: [facility];  Service: Cardiopulmonary;  Laterality: Bilateral;    REVIEW OF SYSTEMS:   Review of Systems  Constitutional: Negative for appetite change, chills, fatigue, fever and unexpected weight change.  HENT:   Negative for mouth sores, nosebleeds, sore throat and trouble swallowing.   Eyes: Negative for eye problems and icterus.  Respiratory: Negative for cough, hemoptysis, shortness of  breath and wheezing.   Cardiovascular: Negative for chest pain and leg swelling.  Gastrointestinal: Negative for abdominal pain, constipation, diarrhea, nausea and vomiting.  Genitourinary: Negative for bladder incontinence, difficulty urinating, dysuria, frequency and hematuria.   Musculoskeletal: Negative for back pain, gait problem, neck pain and neck stiffness.  Skin: Negative for itching and rash.  Neurological: Negative for dizziness, extremity weakness, gait problem, headaches, light-headedness and seizures.  Hematological: Negative for adenopathy. Does not bruise/bleed easily.  Psychiatric/Behavioral: Negative for confusion, depression and sleep disturbance. The patient is not nervous/anxious.     PHYSICAL EXAMINATION:  Blood pressure 133/76, pulse 99, temperature (!) 97.3 F (36.3 C), temperature source Tympanic, resp. rate 18, height 5' 4" (1.626 m), weight 169 lb 3.2 oz (76.7 kg), SpO2 99 %.  ECOG PERFORMANCE STATUS: 1 - Symptomatic but completely ambulatory  Physical Exam  Constitutional: Oriented to person, place, and time and well-developed, well-nourished, and in no distress.  HENT:  Head: Normocephalic and atraumatic.  Mouth/Throat: Oropharynx is clear and moist. No oropharyngeal exudate.  Eyes: Conjunctivae are normal. Right eye   exhibits no discharge. Left eye exhibits no discharge. No scleral icterus.  Neck: Normal range of motion. Neck supple.  Cardiovascular: Normal rate, regular rhythm, normal heart sounds and intact distal pulses.   Pulmonary/Chest: Effort normal and breath sounds normal. No respiratory distress. No wheezes. No rales.  Abdominal: Soft. Bowel sounds are normal. Exhibits no distension and no mass. There is no tenderness.  Musculoskeletal: Normal range of motion. Exhibits no edema.  Lymphadenopathy:    No cervical adenopathy.  Neurological: Alert and oriented to person, place, and time. Exhibits normal muscle tone. Gait normal. Coordination normal.   Skin: Skin is warm and dry. No rash noted. Not diaphoretic. No erythema. No pallor.  Psychiatric: Mood, memory and judgment normal.  Vitals reviewed.  LABORATORY DATA: Lab Results  Component Value Date   WBC 11.1 (H) 01/26/2021   HGB 11.5 (L) 01/26/2021   HCT 35.3 (L) 01/26/2021   MCV 97.8 01/26/2021   PLT 404 (H) 01/26/2021      Chemistry      Component Value Date/Time   NA 139 01/26/2021 1025   NA 139 08/14/2017 0837   K 4.3 01/26/2021 1025   K 4.4 08/14/2017 0837   CL 103 01/26/2021 1025   CO2 25 01/26/2021 1025   CO2 24 08/14/2017 0837   BUN 15 01/26/2021 1025   BUN 13.3 08/14/2017 0837   CREATININE 0.80 01/26/2021 1025   CREATININE 0.79 12/14/2020 0837   CREATININE 0.8 08/14/2017 0837      Component Value Date/Time   CALCIUM 10.0 01/26/2021 1025   CALCIUM 9.3 08/14/2017 0837   ALKPHOS 77 01/26/2021 1025   ALKPHOS 107 08/14/2017 0837   AST 14 (L) 01/26/2021 1025   AST 11 (L) 12/14/2020 0837   AST 12 08/14/2017 0837   ALT 9 01/26/2021 1025   ALT 7 12/14/2020 0837   ALT 12 08/14/2017 0837   BILITOT 0.4 01/26/2021 1025   BILITOT 0.5 12/14/2020 0837   BILITOT 0.37 08/14/2017 0837       RADIOGRAPHIC STUDIES:  MR Brain W Wo Contrast  Result Date: 01/08/2021 CLINICAL DATA:  Follow-up metastatic non-small cell lung cancer. Right parietal metastasis treated with SRS in 2015. EXAM: MRI HEAD WITHOUT AND WITH CONTRAST TECHNIQUE: Multiplanar, multiecho pulse sequences of the brain and surrounding structures were obtained without and with intravenous contrast. CONTRAST:  41m MULTIHANCE GADOBENATE DIMEGLUMINE 529 MG/ML IV SOLN COMPARISON:  07/09/2020 FINDINGS: Brain: New lesions: None. Larger lesions: None. Stable or smaller lesions: 1. 1.6 x 1.4 cm heterogeneously enhancing treated lesion in the right parietal lobe with associated chronic blood products and mild surrounding T2 hyperintensity, unchanged (series 11, image 112). Other brain findings: There is no evidence of  an acute infarct, midline shift, or extra-axial fluid collection. Patchy T2 hyperintensities elsewhere in the cerebral white matter bilaterally and in the pons are unchanged and nonspecific but may reflect a combination of chronic small vessel ischemic disease and post treatment changes. The ventricles are normal in size for age. A partially empty sella is unchanged. A small developmental venous anomaly is noted in the right frontal lobe. Vascular: Major intracranial vascular flow voids are preserved. Skull and upper cervical spine: No suspicious marrow lesion. Sinuses/Orbits: Bilateral cataract extraction. Minimal left ethmoid air cell mucosal thickening and trace bilateral mastoid fluid. Other: None. IMPRESSION: 1. Unchanged treated right parietal metastasis. 2. No evidence of new intracranial metastases. Electronically Signed   By: ALogan BoresM.D.   On: 01/08/2021 09:46     ASSESSMENT/PLAN:  This  isa very pleasant 70 year old Caucasian female with stage IV non-small cell lung cancer, adenocarcinoma who presented with a right upper lobe lungmass in addition to a solitary brain metastatsis. She was diagnosed in January 2015.  The patient had completed induction systemic chemotherapy with carboplatin and Alimta with a partial response. The patient is currently being treated with single agentmaintenanceAlimta. She is status post113cycles.   Labs were reviewed.Recommend that she proceed with cycle #116today as scheduled.  I will arrange for restaging CT scan of the chest, abdomen, and pelvis prior to starting her next cycle of treatment.  We will see her back for a follow uo visit in 3 weeks for evaluation before starting cycle #117 and to review her scan results.   The patient was advised to call immediately if she has any concerning symptoms in the interval. The patient voices understanding of current disease status and treatment options and is in agreement with the current care  plan. All questions were answered. The patient knows to call the clinic with any problems, questions or concerns. We can certainly see the patient much sooner if necessary        Orders Placed This Encounter  Procedures  . CT Chest W Contrast    Please schedule 02/12/21.    Standing Status:   Future    Standing Expiration Date:   01/26/2022    Order Specific Question:   If indicated for the ordered procedure, I authorize the administration of contrast media per Radiology protocol    Answer:   Yes    Order Specific Question:   Preferred imaging location?    Answer:   [facility]  . CT Abdomen Pelvis W Contrast    Please schedule on 02/12/21    Standing Status:   Future    Standing Expiration Date:   01/26/2022    Order Specific Question:   If indicated for the ordered procedure, I authorize the administration of contrast media per Radiology protocol    Answer:   Yes    Order Specific Question:   Preferred imaging location?    Answer:   [facility]    Order Specific Question:   Is Oral Contrast requested for this exam?    Answer:   Yes, Per Radiology protocol     I spent 20-29 minutes in this encounter  Alexis Tomey L Sharnette Kitamura, PA-C 01/26/21

## 2021-01-26 ENCOUNTER — Inpatient Hospital Stay (HOSPITAL_BASED_OUTPATIENT_CLINIC_OR_DEPARTMENT_OTHER): Payer: PPO | Admitting: Physician Assistant

## 2021-01-26 ENCOUNTER — Inpatient Hospital Stay: Payer: PPO

## 2021-01-26 ENCOUNTER — Other Ambulatory Visit: Payer: Self-pay

## 2021-01-26 VITALS — BP 133/76 | HR 99 | Temp 97.3°F | Resp 18 | Ht 64.0 in | Wt 169.2 lb

## 2021-01-26 DIAGNOSIS — Z5112 Encounter for antineoplastic immunotherapy: Secondary | ICD-10-CM | POA: Diagnosis not present

## 2021-01-26 DIAGNOSIS — C3411 Malignant neoplasm of upper lobe, right bronchus or lung: Secondary | ICD-10-CM

## 2021-01-26 DIAGNOSIS — Z5111 Encounter for antineoplastic chemotherapy: Secondary | ICD-10-CM

## 2021-01-26 LAB — CBC WITH DIFFERENTIAL/PLATELET
Abs Immature Granulocytes: 0.15 10*3/uL — ABNORMAL HIGH (ref 0.00–0.07)
Basophils Absolute: 0 10*3/uL (ref 0.0–0.1)
Basophils Relative: 0 %
Eosinophils Absolute: 0 10*3/uL (ref 0.0–0.5)
Eosinophils Relative: 0 %
HCT: 35.3 % — ABNORMAL LOW (ref 36.0–46.0)
Hemoglobin: 11.5 g/dL — ABNORMAL LOW (ref 12.0–15.0)
Immature Granulocytes: 1 %
Lymphocytes Relative: 6 %
Lymphs Abs: 0.6 10*3/uL — ABNORMAL LOW (ref 0.7–4.0)
MCH: 31.9 pg (ref 26.0–34.0)
MCHC: 32.6 g/dL (ref 30.0–36.0)
MCV: 97.8 fL (ref 80.0–100.0)
Monocytes Absolute: 0.9 10*3/uL (ref 0.1–1.0)
Monocytes Relative: 8 %
Neutro Abs: 9.5 10*3/uL — ABNORMAL HIGH (ref 1.7–7.7)
Neutrophils Relative %: 85 %
Platelets: 404 10*3/uL — ABNORMAL HIGH (ref 150–400)
RBC: 3.61 MIL/uL — ABNORMAL LOW (ref 3.87–5.11)
RDW: 14.8 % (ref 11.5–15.5)
WBC: 11.1 10*3/uL — ABNORMAL HIGH (ref 4.0–10.5)
nRBC: 0 % (ref 0.0–0.2)

## 2021-01-26 LAB — COMPREHENSIVE METABOLIC PANEL
ALT: 9 U/L (ref 0–44)
AST: 14 U/L — ABNORMAL LOW (ref 15–41)
Albumin: 3.5 g/dL (ref 3.5–5.0)
Alkaline Phosphatase: 77 U/L (ref 38–126)
Anion gap: 11 (ref 5–15)
BUN: 15 mg/dL (ref 8–23)
CO2: 25 mmol/L (ref 22–32)
Calcium: 10 mg/dL (ref 8.9–10.3)
Chloride: 103 mmol/L (ref 98–111)
Creatinine, Ser: 0.8 mg/dL (ref 0.44–1.00)
GFR, Estimated: >60 mL/min (ref >60)
Glucose, Bld: 138 mg/dL — ABNORMAL HIGH (ref 70–99)
Potassium: 4.3 mmol/L (ref 3.5–5.1)
Sodium: 139 mmol/L (ref 135–145)
Total Bilirubin: 0.4 mg/dL (ref 0.3–1.2)
Total Protein: 7 g/dL (ref 6.5–8.1)

## 2021-01-26 MED ORDER — SODIUM CHLORIDE 0.9 % IV SOLN
10.0000 mg | Freq: Once | INTRAVENOUS | Status: AC
  Administered 2021-01-26: 10 mg via INTRAVENOUS
  Filled 2021-01-26: qty 10

## 2021-01-26 MED ORDER — CYANOCOBALAMIN 1000 MCG/ML IJ SOLN
1000.0000 ug | Freq: Once | INTRAMUSCULAR | Status: AC
  Administered 2021-01-26: 1000 ug via INTRAMUSCULAR

## 2021-01-26 MED ORDER — SODIUM CHLORIDE 0.9 % IV SOLN
Freq: Once | INTRAVENOUS | Status: AC
  Filled 2021-01-26: qty 250

## 2021-01-26 MED ORDER — SODIUM CHLORIDE 0.9 % IV SOLN
500.0000 mg/m2 | Freq: Once | INTRAVENOUS | Status: AC
  Administered 2021-01-26: 900 mg via INTRAVENOUS
  Filled 2021-01-26: qty 20

## 2021-01-26 MED ORDER — CYANOCOBALAMIN 1000 MCG/ML IJ SOLN
INTRAMUSCULAR | Status: AC
  Filled 2021-01-26: qty 1

## 2021-01-26 NOTE — Patient Instructions (Signed)
[facility]   °Discharge Instructions: °Thank you for choosing [facility] to provide your oncology and hematology care.  ° °If you have a lab appointment with the Cancer Center, please go directly to the Cancer Center and check in at the registration area. °  °Wear comfortable clothing and clothing appropriate for easy access to any Portacath or PICC line.  ° °We strive to give you quality time with your provider. You may need to reschedule your appointment if you arrive late (15 or more minutes).  Arriving late affects you and other patients whose appointments are after yours.  Also, if you miss three or more appointments without notifying the office, you may be dismissed from the clinic at the provider’s discretion.    °  °For prescription refill requests, have your pharmacy contact our office and allow 72 hours for refills to be completed.   ° °Today you received the following chemotherapy and/or immunotherapy agents: avastin  °  °To help prevent nausea and vomiting after your treatment, we encourage you to take your nausea medication as directed. ° °BELOW ARE SYMPTOMS THAT SHOULD BE REPORTED IMMEDIATELY: °*FEVER GREATER THAN 100.4 F (38 °C) OR HIGHER °*CHILLS OR SWEATING °*NAUSEA AND VOMITING THAT IS NOT CONTROLLED WITH YOUR NAUSEA MEDICATION °*UNUSUAL SHORTNESS OF BREATH °*UNUSUAL BRUISING OR BLEEDING °*URINARY PROBLEMS (pain or burning when urinating, or frequent urination) °*BOWEL PROBLEMS (unusual diarrhea, constipation, pain near the anus) °TENDERNESS IN MOUTH AND THROAT WITH OR WITHOUT PRESENCE OF ULCERS (sore throat, sores in mouth, or a toothache) °UNUSUAL RASH, SWELLING OR PAIN  °UNUSUAL VAGINAL DISCHARGE OR ITCHING  ° °Items with * indicate a potential emergency and should be followed up as soon as possible or go to the Emergency Department if any problems should occur. ° °Please show the CHEMOTHERAPY ALERT CARD or IMMUNOTHERAPY ALERT CARD at check-in to  the Emergency Department and triage nurse. ° °Should you have questions after your visit or need to cancel or reschedule your appointment, please contact [facility]  Dept: [phone]  and follow the prompts.  Office hours are 8:00 a.m. to 4:30 p.m. Monday - Friday. Please note that voicemails left after 4:00 p.m. may not be returned until the following business day.  We are closed weekends and major holidays. You have access to a nurse at all times for urgent questions. Please call the main number to the clinic Dept: [phone] and follow the prompts. ° ° °For any non-urgent questions, you may also contact your provider using MyChart. We now offer e-Visits for anyone 18 and older to request care online for non-urgent symptoms. For details visit [email or web address]. °  °Also download the MyChart app! Go to the app store, search "MyChart", open the app, select , and log in with your MyChart username and password. ° °Due to Covid, a mask is required upon entering the hospital/clinic. If you do not have a mask, one will be given to you upon arrival. For doctor visits, patients may have 1 support person aged 18 or older with them. For treatment visits, patients cannot have anyone with them due to current Covid guidelines and our immunocompromised population.  ° °

## 2021-01-27 ENCOUNTER — Other Ambulatory Visit: Payer: Self-pay | Admitting: Radiation Therapy

## 2021-01-27 ENCOUNTER — Telehealth: Payer: Self-pay | Admitting: Physician Assistant

## 2021-01-27 DIAGNOSIS — C7931 Secondary malignant neoplasm of brain: Secondary | ICD-10-CM

## 2021-01-27 NOTE — Telephone Encounter (Signed)
Scheduled per los. Called, not able to leave msg. Mailed printout  

## 2021-02-11 ENCOUNTER — Ambulatory Visit
Admission: RE | Admit: 2021-02-11 | Discharge: 2021-02-11 | Disposition: A | Discharge status: Home / Self Care | Payer: PPO | Source: Ambulatory Visit | Attending: Physician Assistant | Admitting: Physician Assistant

## 2021-02-11 ENCOUNTER — Encounter: Payer: Self-pay | Admitting: Internal Medicine

## 2021-02-11 DIAGNOSIS — J941 Fibrothorax: Secondary | ICD-10-CM | POA: Diagnosis not present

## 2021-02-11 DIAGNOSIS — J432 Centrilobular emphysema: Secondary | ICD-10-CM | POA: Diagnosis not present

## 2021-02-11 DIAGNOSIS — C3411 Malignant neoplasm of upper lobe, right bronchus or lung: Secondary | ICD-10-CM

## 2021-02-11 DIAGNOSIS — N3289 Other specified disorders of bladder: Secondary | ICD-10-CM | POA: Diagnosis not present

## 2021-02-11 DIAGNOSIS — K439 Ventral hernia without obstruction or gangrene: Secondary | ICD-10-CM | POA: Diagnosis not present

## 2021-02-11 DIAGNOSIS — J8410 Pulmonary fibrosis, unspecified: Secondary | ICD-10-CM | POA: Diagnosis not present

## 2021-02-11 DIAGNOSIS — C3490 Malignant neoplasm of unspecified part of unspecified bronchus or lung: Secondary | ICD-10-CM | POA: Diagnosis not present

## 2021-02-11 DIAGNOSIS — K429 Umbilical hernia without obstruction or gangrene: Secondary | ICD-10-CM | POA: Diagnosis not present

## 2021-02-11 MED ORDER — IOPAMIDOL (ISOVUE-M 300) INJECTION 61%: 15.0000 mL | Freq: Once | INTRAMUSCULAR | Status: DC | PRN

## 2021-02-11 MED ORDER — IOPAMIDOL (ISOVUE-300) INJECTION 61%
100.0000 mL | Freq: Once | INTRAVENOUS | Status: AC | PRN
  Administered 2021-02-11: 100 mL via INTRAVENOUS

## 2021-02-12 ENCOUNTER — Other Ambulatory Visit: Payer: Self-pay | Admitting: Physician Assistant

## 2021-02-12 DIAGNOSIS — N390 Urinary tract infection, site not specified: Secondary | ICD-10-CM

## 2021-02-12 MED ORDER — NITROFURANTOIN MONOHYD MACRO 100 MG PO CAPS
100.0000 mg | ORAL_CAPSULE | Freq: Two times a day (BID) | ORAL | 0 refills | Status: DC
Start: 2021-02-12 — End: 2021-08-24

## 2021-02-12 NOTE — Progress Notes (Signed)
The patient's CT scan was suggestive of cystitis. I called the patient and she has been having burning on and off with urination. She is on allergic to any anti-biotics. I discussed I can treat her empirically or she can come to the clinic today for a UA/culture. She is unable to come today due to trees being down on her driveway. I will go ahead and send an anti-biotic to her pharmacy.

## 2021-02-15 ENCOUNTER — Inpatient Hospital Stay: Payer: PPO | Attending: Internal Medicine

## 2021-02-15 ENCOUNTER — Ambulatory Visit: Payer: PPO | Admitting: Physician Assistant

## 2021-02-15 ENCOUNTER — Inpatient Hospital Stay: Payer: PPO

## 2021-02-15 ENCOUNTER — Inpatient Hospital Stay (HOSPITAL_BASED_OUTPATIENT_CLINIC_OR_DEPARTMENT_OTHER): Payer: PPO | Admitting: Internal Medicine

## 2021-02-15 ENCOUNTER — Other Ambulatory Visit: Payer: Self-pay

## 2021-02-15 VITALS — BP 150/68 | HR 92 | Temp 97.9°F | Resp 18 | Ht 64.0 in | Wt 168.9 lb

## 2021-02-15 DIAGNOSIS — Z923 Personal history of irradiation: Secondary | ICD-10-CM | POA: Diagnosis not present

## 2021-02-15 DIAGNOSIS — Z79899 Other long term (current) drug therapy: Secondary | ICD-10-CM | POA: Diagnosis not present

## 2021-02-15 DIAGNOSIS — C3411 Malignant neoplasm of upper lobe, right bronchus or lung: Secondary | ICD-10-CM | POA: Diagnosis not present

## 2021-02-15 DIAGNOSIS — Z5111 Encounter for antineoplastic chemotherapy: Secondary | ICD-10-CM

## 2021-02-15 DIAGNOSIS — Z5112 Encounter for antineoplastic immunotherapy: Secondary | ICD-10-CM | POA: Insufficient documentation

## 2021-02-15 LAB — CMP (CANCER CENTER ONLY)
ALT: 15 U/L (ref 0–44)
AST: 18 U/L (ref 15–41)
Albumin: 4 g/dL (ref 3.5–5.0)
Alkaline Phosphatase: 73 U/L (ref 38–126)
Anion gap: 10 (ref 5–15)
BUN: 18 mg/dL (ref 8–23)
CO2: 24 mmol/L (ref 22–32)
Calcium: 9.9 mg/dL (ref 8.9–10.3)
Chloride: 103 mmol/L (ref 98–111)
Creatinine: 0.94 mg/dL (ref 0.44–1.00)
GFR, Estimated: >60 mL/min (ref >60)
Glucose, Bld: 137 mg/dL — ABNORMAL HIGH (ref 70–99)
Potassium: 4.2 mmol/L (ref 3.5–5.1)
Sodium: 137 mmol/L (ref 135–145)
Total Bilirubin: 0.5 mg/dL (ref 0.3–1.2)
Total Protein: 7.3 g/dL (ref 6.5–8.1)

## 2021-02-15 LAB — CBC WITH DIFFERENTIAL/PLATELET
Abs Immature Granulocytes: 0.07 10*3/uL (ref 0.00–0.07)
Basophils Absolute: 0 10*3/uL (ref 0.0–0.1)
Basophils Relative: 0 %
Eosinophils Absolute: 0 10*3/uL (ref 0.0–0.5)
Eosinophils Relative: 0 %
HCT: 35.6 % — ABNORMAL LOW (ref 36.0–46.0)
Hemoglobin: 11.5 g/dL — ABNORMAL LOW (ref 12.0–15.0)
Immature Granulocytes: 1 %
Lymphocytes Relative: 7 %
Lymphs Abs: 0.5 10*3/uL — ABNORMAL LOW (ref 0.7–4.0)
MCH: 31.5 pg (ref 26.0–34.0)
MCHC: 32.3 g/dL (ref 30.0–36.0)
MCV: 97.5 fL (ref 80.0–100.0)
Monocytes Absolute: 0.5 10*3/uL (ref 0.1–1.0)
Monocytes Relative: 7 %
Neutro Abs: 6.4 10*3/uL (ref 1.7–7.7)
Neutrophils Relative %: 85 %
Platelets: 294 10*3/uL (ref 150–400)
RBC: 3.65 MIL/uL — ABNORMAL LOW (ref 3.87–5.11)
RDW: 15.1 % (ref 11.5–15.5)
WBC: 7.5 10*3/uL (ref 4.0–10.5)
nRBC: 0 % (ref 0.0–0.2)

## 2021-02-15 MED ORDER — SODIUM CHLORIDE 0.9 % IV SOLN
10.0000 mg | Freq: Once | INTRAVENOUS | Status: AC
  Administered 2021-02-15: 10 mg via INTRAVENOUS
  Filled 2021-02-15: qty 10

## 2021-02-15 MED ORDER — SODIUM CHLORIDE 0.9 % IV SOLN
Freq: Once | INTRAVENOUS | Status: AC
  Filled 2021-02-15: qty 250

## 2021-02-15 MED ORDER — SODIUM CHLORIDE 0.9 % IV SOLN
500.0000 mg/m2 | Freq: Once | INTRAVENOUS | Status: AC
  Administered 2021-02-15: 900 mg via INTRAVENOUS
  Filled 2021-02-15: qty 20

## 2021-02-15 NOTE — Progress Notes (Signed)
[facility] Telephone:([phone]) [phone]   Fax:([phone]  OFFICE PROGRESS NOTE  [name], [place] [address]  DIAGNOSIS: Stage IV (T2a, N0, M1b) non-small cell lung cancer, adenocarcinoma with negative EGFR and ALK mutations diagnosed in January 2015 and presented with right upper lobe lung mass in addition to a solitary brain metastasis.  PRIOR THERAPY: 1) Status post stereotactic radiotherapy to a solitary right parietal brain lesion under the care of Dr. [name] on 10/16/2013. 2) Status post palliative radiotherapy to the right lung tumor under the care of Dr. [name] completed on 12/05/2013. 3) Systemic chemotherapy with carboplatin for AUC of 5 and Alimta 500 mg/M2 every 3 weeks. First dose Jan 06 2014. Status post 6 cycles.  CURRENT THERAPY: Systemic chemotherapy with maintenance Alimta 500 MG/M2 every 3 weeks, status post 116 cycles.  INTERVAL HISTORY: Alexis [name] 70 y.o. female returns to the clinic today for follow-up visit.  The patient is feeling fine today with no concerning complaints except for occasional burning sensation of her urine.  She denied having any current chest pain, shortness of breath, cough or hemoptysis.  She denied having any fever or chills.  She has no nausea, vomiting, diarrhea or constipation.  She has no headache or visual changes.  She has no recent weight loss or night sweats.  She continues to tolerate her treatment with maintenance Alimta fairly well.  The patient had repeat CT scan of the chest, abdomen pelvis performed recently and she is here for evaluation and discussion of her risk her results.  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  MEDICATIONS:  Current Outpatient Medications  Medication Sig Dispense Refill   acetaminophen (TYLENOL) 500 MG tablet Take 500 mg by mouth every 6 (six) hours as needed for mild pain or headache. Reported on 11/02/2015     ALPRAZolam (XANAX) 1 MG tablet SMARTSIG:1 Tablet(s) By Mouth 1 to 3 Times Daily PRN     Ascorbic Acid (VITAMIN C GUMMIE PO) Take 1 each by mouth every morning.     aspirin EC 81 MG tablet Take 1 tablet (81 mg total) by mouth daily. 150 tablet 2   [employer] ACID CONTROLLER 10 MG tablet SMARTSIG:1 Tablet(s) By Mouth Every 12 Hours PRN     dexamethasone (DECADRON) 4 MG tablet Take 1 tablet twice a day the day before, the day of, and the day after chemotherapy. 40 tablet 2   folic acid (FOLVITE) 1 MG tablet TAKE 1 TABLET BY MOUTH EVERY DAY 90 tablet 0   HYDROcodone-acetaminophen (NORCO/VICODIN) 5-325 MG tablet TAKE 1 TABLET BY MOUTH 3 TIMES A DAY FOR 5 DAYS AS NEEDED FOR PAIN     loratadine (CLARITIN) 10 MG tablet Take 10 mg by mouth daily.     metFORMIN (GLUCOPHAGE) 500 MG tablet Take 500 mg by mouth 2 (two) times daily.     Multiple Vitamin (MULTIVITAMIN WITH MINERALS) TABS tablet Take 1 tablet by mouth every morning.     nitrofurantoin, macrocrystal-monohydrate, (MACROBID) 100 MG capsule Take 1 capsule (  100 mg total) by mouth 2 (two) times daily. 10 capsule 0   omeprazole (PRILOSEC) 20 MG capsule TAKE 1 CAPSULE BY MOUTH EVERY DAY 30 capsule 2   ondansetron (ZOFRAN) 8 MG tablet TAKE 1 TABLET BY MOUTH BEFORE CHEMO 30 tablet 0   OVER THE COUNTER MEDICATION Take 1 tablet by mouth every morning. (Vitamin A)     prochlorperazine (COMPAZINE) 10 MG tablet Take 1 tablet (10 mg total) by mouth every 6 (six) hours as needed for nausea or vomiting. 60 tablet 0   rosuvastatin (CRESTOR) 10 MG tablet Take 1 tablet (10 mg total) by mouth daily. 30 tablet 12   senna-docusate (SENOKOT-S) 8.6-50 MG  tablet Take 1 tablet by mouth daily. 30 tablet prn   valACYclovir (VALTREX) 1000 MG tablet Take 1,000 mg by mouth 3 (three) times daily.     Vitamin D, Ergocalciferol, (DRISDOL) 1.25 MG (50000 UNIT) CAPS capsule Take 50,000 Units by mouth once a week.     No current facility-administered medications for this visit.   Facility-Administered Medications Ordered in Other Visits  Medication Dose Route Frequency Provider Last Rate Last Admin   sodium chloride flush (NS) 0.9 % injection 10 mL  10 mL Intravenous PRN Curt Bears, MD       sodium chloride flush (NS) 0.9 % injection 10 mL  10 mL Intravenous PRN Curt Bears, MD        SURGICAL HISTORY:  Past Surgical History:  Procedure Laterality Date   ABDOMINAL HYSTERECTOMY     COLOSTOMY TAKEDOWN N/A 07/10/2014   Procedure: LAPAROSCOPIC LYSIS OF ADHESIONS (90 MIN) LAPAROSCOPIC ASSISTED COLOSTOMY CLOSURE, RIGID PROCTOSIGMOIDOSCOPY;  Surgeon: Jackolyn Confer, MD;  Location: [facility];  Service: General;  Laterality: N/A;   LAPAROTOMY N/A 11/03/2013   Procedure: EXPLORATORY LAPAROTOMY, DRAINAGE OF INTRA  ABDOMINAL ABSCESSES, MOBILIZATION OF SPLENIC FLEXURE, SIGMOID COLECTOMY WITH COLOSTOMY;  Surgeon: Odis Hollingshead, MD;  Location: [facility];  Service: General;  Laterality: N/A;   VIDEO BRONCHOSCOPY Bilateral 08/30/2013   Procedure: VIDEO BRONCHOSCOPY WITH FLUORO;  Surgeon: Tanda Rockers, MD;  Location: [facility];  Service: Cardiopulmonary;  Laterality: Bilateral;    REVIEW OF SYSTEMS:  Constitutional: negative Eyes: negative Ears, nose, mouth, throat, and face: negative Respiratory: negative Cardiovascular: negative Gastrointestinal: negative Genitourinary:positive for dysuria Integument/breast: negative Hematologic/lymphatic: negative Musculoskeletal:negative Neurological: negative Behavioral/Psych: negative Endocrine: negative Allergic/Immunologic: negative   PHYSICAL EXAMINATION: General appearance: alert, cooperative, and no  distress Head: Normocephalic, without obvious abnormality, atraumatic Neck: no adenopathy, no JVD, supple, symmetrical, trachea midline, and thyroid not enlarged, symmetric, no tenderness/mass/nodules Lymph nodes: Cervical, supraclavicular, and axillary nodes normal. Resp: clear to auscultation bilaterally Back: symmetric, no curvature. ROM normal. No CVA tenderness. Cardio: regular rate and rhythm, S1, S2 normal, no murmur, click, rub or gallop GI: soft, non-tender; bowel sounds normal; no masses,  no organomegaly Extremities: extremities normal, atraumatic, no cyanosis or edema Neurologic: Alert and oriented X 3, normal strength and tone. Normal symmetric reflexes. Normal coordination and gait   ECOG PERFORMANCE STATUS: 1 - Symptomatic but completely ambulatory  Blood pressure (!) 150/68, pulse 92, temperature 97.9 F (36.6 C), temperature source Tympanic, resp. rate 18, height 5' 4" (1.626 m), weight 168 lb 14.4 oz (76.6 kg), SpO2 97 %.  LABORATORY DATA: Lab Results  Component Value Date   WBC 7.5 02/15/2021   HGB 11.5 (L) 02/15/2021   HCT 35.6 (L) 02/15/2021   MCV 97.5 02/15/2021   PLT 294 02/15/2021      Chemistry  Component Value Date/Time   NA 139 01/26/2021 1025   NA 139 08/14/2017 0837   K 4.3 01/26/2021 1025   K 4.4 08/14/2017 0837   CL 103 01/26/2021 1025   CO2 25 01/26/2021 1025   CO2 24 08/14/2017 0837   BUN 15 01/26/2021 1025   BUN 13.3 08/14/2017 0837   CREATININE 0.80 01/26/2021 1025   CREATININE 0.79 12/14/2020 0837   CREATININE 0.8 08/14/2017 0837      Component Value Date/Time   CALCIUM 10.0 01/26/2021 1025   CALCIUM 9.3 08/14/2017 0837   ALKPHOS 77 01/26/2021 1025   ALKPHOS 107 08/14/2017 0837   AST 14 (L) 01/26/2021 1025   AST 11 (L) 12/14/2020 0837   AST 12 08/14/2017 0837   ALT 9 01/26/2021 1025   ALT 7 12/14/2020 0837   ALT 12 08/14/2017 0837   BILITOT 0.4 01/26/2021 1025   BILITOT 0.5 12/14/2020 0837   BILITOT 0.37 08/14/2017 0837        RADIOGRAPHIC STUDIES: CT Chest W Contrast  Result Date: 02/12/2021 CLINICAL DATA:  Restaging metastatic non-small cell lung cancer. EXAM: CT CHEST, ABDOMEN, AND PELVIS WITH CONTRAST TECHNIQUE: Multidetector CT imaging of the chest, abdomen and pelvis was performed following the standard protocol during bolus administration of intravenous contrast. CONTRAST:  154m ISOVUE-300 IOPAMIDOL (ISOVUE-300) INJECTION 61% COMPARISON:  11/04/2020 FINDINGS: CT CHEST FINDINGS Cardiovascular: Normal heart size. No pericardial effusion. Aortic atherosclerosis. Mediastinum/Nodes: Normal appearance of the thyroid gland. The trachea appears patent and is midline. Normal appearance of the esophagus. No enlarged supraclavicular or axillary lymph nodes. No mediastinal or hilar adenopathy. Lungs/Pleura: No pleural effusion. Moderate to advanced changes of paraseptal and centrilobular emphysema with diffuse bronchial wall thickening. Paramediastinal fibrosis and masslike architectural distortion is identified within the right upper lobe and right apex. 6 mm ground-glass nodule in the posterior right lower lobe, image 96/5. Stable. 4 mm subpleural nodule in the anterior basal right middle lobe, image 112/5. Stable. 2 mm nodule in the posterior left upper lobe, image 66/5. New from previous exam. Musculoskeletal: No chest wall mass or suspicious bone lesions identified. CT ABDOMEN PELVIS FINDINGS Hepatobiliary: No focal liver abnormality is seen. No gallstones, gallbladder wall thickening, or biliary dilatation. Pancreas: Unremarkable. No pancreatic ductal dilatation or surrounding inflammatory changes. Spleen: Normal in size without focal abnormality. Adrenals/Urinary Tract: Normal adrenal glands. No kidney mass or hydronephrosis. Mild diffuse bladder wall thickening and mucosal enhancement appears new from previous exam. No focal bladder lesion identified. Stomach/Bowel: Stomach is within normal limits. Appendix normal. No bowel  wall thickening, inflammation or distension. Vascular/Lymphatic: Aortic atherosclerosis. No aneurysm. No abdominopelvic adenopathy. Reproductive: Status post hysterectomy. No adnexal masses. Other: No free fluid or fluid collections. Supraumbilical ventral abdominal wall hernia contains fat only. There is also a small fat containing umbilical hernia. Infraumbilical hernia along the midline also contains fat only measuring 3.8 cm, image 91/2. Musculoskeletal: No acute or suspicious osseous findings. IMPRESSION: 1. Stable post treatment changes within the right upper lobe and right apex. No specific findings identified to suggest locally recurrent tumor or metastatic disease. 2. There is a new 2 mm nodule in the posterior left upper lobe. This is a nonspecific finding. Attention on follow-up imaging advised. 3. Unchanged solid subpleural nodule in the right middle lobe and non solid nodule identified in the right lower lobe. 4. There is new mild bladder wall thickening with diffuse mucosal enhancement. Correlate for any clinical signs or symptoms of cystitis. 5. Diffuse bronchial wall thickening with emphysema, as above;  imaging findings suggestive of underlying COPD. 6. Emphysema and aortic atherosclerosis. Aortic Atherosclerosis (ICD10-I70.0) and Emphysema (ICD10-J43.9). Electronically Signed   By: Kerby Moors M.D.   On: 02/12/2021 06:45   CT Abdomen Pelvis W Contrast  Result Date: 02/12/2021 CLINICAL DATA:  Restaging metastatic non-small cell lung cancer. EXAM: CT CHEST, ABDOMEN, AND PELVIS WITH CONTRAST TECHNIQUE: Multidetector CT imaging of the chest, abdomen and pelvis was performed following the standard protocol during bolus administration of intravenous contrast. CONTRAST:  158m ISOVUE-300 IOPAMIDOL (ISOVUE-300) INJECTION 61% COMPARISON:  11/04/2020 FINDINGS: CT CHEST FINDINGS Cardiovascular: Normal heart size. No pericardial effusion. Aortic atherosclerosis. Mediastinum/Nodes: Normal appearance of  the thyroid gland. The trachea appears patent and is midline. Normal appearance of the esophagus. No enlarged supraclavicular or axillary lymph nodes. No mediastinal or hilar adenopathy. Lungs/Pleura: No pleural effusion. Moderate to advanced changes of paraseptal and centrilobular emphysema with diffuse bronchial wall thickening. Paramediastinal fibrosis and masslike architectural distortion is identified within the right upper lobe and right apex. 6 mm ground-glass nodule in the posterior right lower lobe, image 96/5. Stable. 4 mm subpleural nodule in the anterior basal right middle lobe, image 112/5. Stable. 2 mm nodule in the posterior left upper lobe, image 66/5. New from previous exam. Musculoskeletal: No chest wall mass or suspicious bone lesions identified. CT ABDOMEN PELVIS FINDINGS Hepatobiliary: No focal liver abnormality is seen. No gallstones, gallbladder wall thickening, or biliary dilatation. Pancreas: Unremarkable. No pancreatic ductal dilatation or surrounding inflammatory changes. Spleen: Normal in size without focal abnormality. Adrenals/Urinary Tract: Normal adrenal glands. No kidney mass or hydronephrosis. Mild diffuse bladder wall thickening and mucosal enhancement appears new from previous exam. No focal bladder lesion identified. Stomach/Bowel: Stomach is within normal limits. Appendix normal. No bowel wall thickening, inflammation or distension. Vascular/Lymphatic: Aortic atherosclerosis. No aneurysm. No abdominopelvic adenopathy. Reproductive: Status post hysterectomy. No adnexal masses. Other: No free fluid or fluid collections. Supraumbilical ventral abdominal wall hernia contains fat only. There is also a small fat containing umbilical hernia. Infraumbilical hernia along the midline also contains fat only measuring 3.8 cm, image 91/2. Musculoskeletal: No acute or suspicious osseous findings. IMPRESSION: 1. Stable post treatment changes within the right upper lobe and right apex. No  specific findings identified to suggest locally recurrent tumor or metastatic disease. 2. There is a new 2 mm nodule in the posterior left upper lobe. This is a nonspecific finding. Attention on follow-up imaging advised. 3. Unchanged solid subpleural nodule in the right middle lobe and non solid nodule identified in the right lower lobe. 4. There is new mild bladder wall thickening with diffuse mucosal enhancement. Correlate for any clinical signs or symptoms of cystitis. 5. Diffuse bronchial wall thickening with emphysema, as above; imaging findings suggestive of underlying COPD. 6. Emphysema and aortic atherosclerosis. Aortic Atherosclerosis (ICD10-I70.0) and Emphysema (ICD10-J43.9). Electronically Signed   By: TKerby MoorsM.D.   On: 02/12/2021 06:45    ASSESSMENT AND PLAN:  This is a very pleasant 70years old white female with metastatic non-small cell lung cancer, adenocarcinoma status post induction systemic chemotherapy with carboplatin and Alimta with partial response. The patient is currently on maintenance treatment with single agent Alimta status post 116 cycles. The patient continues to tolerate this treatment well with no concerning adverse effects. She had repeat CT scan of the chest, abdomen pelvis.  I personally and independently reviewed the scans and discussed the results with the patient today. Her scan showed no concerning findings for disease progression but there was a new 2 mm  nodule in the posterior left upper lobe that is nonspecific but we will continue to monitor closely on the upcoming imaging studies. I recommended for the patient to proceed with cycle #117 today. I will see her back for follow-up visit in 3 weeks for evaluation before the next cycle of her treatment. For the dysuria and urinary bladder wall thickening, she was started on treatment with antibiotics and currently feeling well.  Will consider repeating urinalysis with her upcoming visit and referral to  urology if she continues to have recurrent episodes of cystitis. For the hypertension, the patient will continue to monitor her blood pressure closely at home. For the anxiety she is currently on Xanax. The patient was advised to call immediately if she has any concerning symptoms in the interval  The patient voices understanding of current disease status and treatment options and is in agreement with the current care plan. All questions were answered. The patient knows to call the clinic with any problems, questions or concerns. We can certainly see the patient much sooner if necessary.  Disclaimer: This note was dictated with voice recognition software. Similar sounding words can inadvertently be transcribed and may not be corrected upon review.

## 2021-02-15 NOTE — Patient Instructions (Signed)
[facility]  Discharge Instructions: Thank you for choosing [facility] to provide your oncology and hematology care.   If you have a lab appointment with the [facility], please go directly to the [facility] and check in at the registration area.   Wear comfortable clothing and clothing appropriate for easy access to any Portacath or PICC line.   We strive to give you quality time with your provider. You may need to reschedule your appointment if you arrive late (15 or more minutes).  Arriving late affects you and other patients whose appointments are after yours.  Also, if you miss three or more appointments without notifying the office, you may be dismissed from the clinic at the provider's discretion.      For prescription refill requests, have your pharmacy contact our office and allow 72 hours for refills to be completed.    Today you received the following chemotherapy and/or immunotherapy agents : Alimta     To help prevent nausea and vomiting after your treatment, we encourage you to take your nausea medication as directed.  BELOW ARE SYMPTOMS THAT SHOULD BE REPORTED IMMEDIATELY: *FEVER GREATER THAN 100.4 F (38 C) OR HIGHER *CHILLS OR SWEATING *NAUSEA AND VOMITING THAT IS NOT CONTROLLED WITH YOUR NAUSEA MEDICATION *UNUSUAL SHORTNESS OF BREATH *UNUSUAL BRUISING OR BLEEDING *URINARY PROBLEMS (pain or burning when urinating, or frequent urination) *BOWEL PROBLEMS (unusual diarrhea, constipation, pain near the anus) TENDERNESS IN MOUTH AND THROAT WITH OR WITHOUT PRESENCE OF ULCERS (sore throat, sores in mouth, or a toothache) UNUSUAL RASH, SWELLING OR PAIN  UNUSUAL VAGINAL DISCHARGE OR ITCHING   Items with * indicate a potential emergency and should be followed up as soon as possible or go to the Emergency Department if any problems should occur.  Please show the CHEMOTHERAPY ALERT CARD or IMMUNOTHERAPY ALERT CARD at check-in to the  Emergency Department and triage nurse.  Should you have questions after your visit or need to cancel or reschedule your appointment, please contact [facility]  Dept: [phone]  and follow the prompts.  Office hours are 8:00 a.m. to 4:30 p.m. Monday - Friday. Please note that voicemails left after 4:00 p.m. may not be returned until the following business day.  We are closed weekends and major holidays. You have access to a nurse at all times for urgent questions. Please call the main number to the clinic Dept: [phone] and follow the prompts.   For any non-urgent questions, you may also contact your provider using MyChart. We now offer e-Visits for anyone 69 and older to request care online for non-urgent symptoms. For details visit [email or web address].   Also download the MyChart app! Go to the app store, search "MyChart", open the app, select [facility], and log in with your MyChart username and password.  Due to Covid, a mask is required upon entering the hospital/clinic. If you do not have a mask, one will be given to you upon arrival. For doctor visits, patients may have 1 support person aged 69 or older with them. For treatment visits, patients cannot have anyone with them due to current Covid guidelines and our immunocompromised population.

## 2021-02-27 ENCOUNTER — Other Ambulatory Visit: Payer: Self-pay | Admitting: Physician Assistant

## 2021-02-27 DIAGNOSIS — C3411 Malignant neoplasm of upper lobe, right bronchus or lung: Secondary | ICD-10-CM

## 2021-02-28 ENCOUNTER — Other Ambulatory Visit: Payer: Self-pay | Admitting: Physician Assistant

## 2021-02-28 DIAGNOSIS — C3490 Malignant neoplasm of unspecified part of unspecified bronchus or lung: Secondary | ICD-10-CM

## 2021-03-02 ENCOUNTER — Encounter: Payer: Self-pay | Admitting: Internal Medicine

## 2021-03-08 ENCOUNTER — Inpatient Hospital Stay: Payer: PPO

## 2021-03-08 ENCOUNTER — Encounter: Payer: Self-pay | Admitting: Internal Medicine

## 2021-03-08 ENCOUNTER — Inpatient Hospital Stay: Payer: PPO | Attending: Internal Medicine | Admitting: Internal Medicine

## 2021-03-08 ENCOUNTER — Other Ambulatory Visit: Payer: Self-pay

## 2021-03-08 ENCOUNTER — Other Ambulatory Visit: Payer: PPO

## 2021-03-08 VITALS — BP 122/74 | HR 96 | Temp 97.8°F | Resp 18 | Wt 168.9 lb

## 2021-03-08 DIAGNOSIS — Z5112 Encounter for antineoplastic immunotherapy: Secondary | ICD-10-CM | POA: Insufficient documentation

## 2021-03-08 DIAGNOSIS — I10 Essential (primary) hypertension: Secondary | ICD-10-CM | POA: Diagnosis not present

## 2021-03-08 DIAGNOSIS — C3411 Malignant neoplasm of upper lobe, right bronchus or lung: Secondary | ICD-10-CM | POA: Insufficient documentation

## 2021-03-08 DIAGNOSIS — Z79899 Other long term (current) drug therapy: Secondary | ICD-10-CM | POA: Diagnosis not present

## 2021-03-08 DIAGNOSIS — F419 Anxiety disorder, unspecified: Secondary | ICD-10-CM | POA: Diagnosis not present

## 2021-03-08 DIAGNOSIS — Z5111 Encounter for antineoplastic chemotherapy: Secondary | ICD-10-CM

## 2021-03-08 LAB — CBC WITH DIFFERENTIAL (CANCER CENTER ONLY)
Abs Immature Granulocytes: 0.11 10*3/uL — ABNORMAL HIGH (ref 0.00–0.07)
Basophils Absolute: 0 10*3/uL (ref 0.0–0.1)
Basophils Relative: 0 %
Eosinophils Absolute: 0 10*3/uL (ref 0.0–0.5)
Eosinophils Relative: 0 %
HCT: 36.5 % (ref 36.0–46.0)
Hemoglobin: 11.7 g/dL — ABNORMAL LOW (ref 12.0–15.0)
Immature Granulocytes: 1 %
Lymphocytes Relative: 7 %
Lymphs Abs: 0.6 10*3/uL — ABNORMAL LOW (ref 0.7–4.0)
MCH: 31.2 pg (ref 26.0–34.0)
MCHC: 32.1 g/dL (ref 30.0–36.0)
MCV: 97.3 fL (ref 80.0–100.0)
Monocytes Absolute: 0.9 10*3/uL (ref 0.1–1.0)
Monocytes Relative: 9 %
Neutro Abs: 8.2 10*3/uL — ABNORMAL HIGH (ref 1.7–7.7)
Neutrophils Relative %: 83 %
Platelet Count: 356 10*3/uL (ref 150–400)
RBC: 3.75 MIL/uL — ABNORMAL LOW (ref 3.87–5.11)
RDW: 14.7 % (ref 11.5–15.5)
WBC Count: 9.8 10*3/uL (ref 4.0–10.5)
nRBC: 0 % (ref 0.0–0.2)

## 2021-03-08 LAB — CMP (CANCER CENTER ONLY)
ALT: 8 U/L (ref 0–44)
AST: 10 U/L — ABNORMAL LOW (ref 15–41)
Albumin: 3.3 g/dL — ABNORMAL LOW (ref 3.5–5.0)
Alkaline Phosphatase: 76 U/L (ref 38–126)
Anion gap: 10 (ref 5–15)
BUN: 18 mg/dL (ref 8–23)
CO2: 25 mmol/L (ref 22–32)
Calcium: 9.8 mg/dL (ref 8.9–10.3)
Chloride: 104 mmol/L (ref 98–111)
Creatinine: 0.8 mg/dL (ref 0.44–1.00)
GFR, Estimated: >60 mL/min (ref >60)
Glucose, Bld: 146 mg/dL — ABNORMAL HIGH (ref 70–99)
Potassium: 4.4 mmol/L (ref 3.5–5.1)
Sodium: 139 mmol/L (ref 135–145)
Total Bilirubin: 0.3 mg/dL (ref 0.3–1.2)
Total Protein: 7 g/dL (ref 6.5–8.1)

## 2021-03-08 MED ORDER — SODIUM CHLORIDE 0.9 % IV SOLN
Freq: Once | INTRAVENOUS | Status: AC
Start: 2021-03-08 — End: 2021-03-08
  Filled 2021-03-08: qty 250

## 2021-03-08 MED ORDER — SODIUM CHLORIDE 0.9 % IV SOLN
500.0000 mg/m2 | Freq: Once | INTRAVENOUS | Status: AC
  Administered 2021-03-08: 900 mg via INTRAVENOUS
  Filled 2021-03-08: qty 20

## 2021-03-08 MED ORDER — SODIUM CHLORIDE 0.9 % IV SOLN
10.0000 mg | Freq: Once | INTRAVENOUS | Status: AC
  Administered 2021-03-08: 10 mg via INTRAVENOUS
  Filled 2021-03-08: qty 10

## 2021-03-08 NOTE — Patient Instructions (Signed)
[facility]  Discharge Instructions: Thank you for choosing [facility] to provide your oncology and hematology care.   If you have a lab appointment with the [facility], please go directly to the [facility] and check in at the registration area.   Wear comfortable clothing and clothing appropriate for easy access to any Portacath or PICC line.   We strive to give you quality time with your provider. You may need to reschedule your appointment if you arrive late (15 or more minutes).  Arriving late affects you and other patients whose appointments are after yours.  Also, if you miss three or more appointments without notifying the office, you may be dismissed from the clinic at the provider's discretion.      For prescription refill requests, have your pharmacy contact our office and allow 72 hours for refills to be completed.    Today you received the following chemotherapy and/or immunotherapy agent: Pemetrexed (Alimta).   To help prevent nausea and vomiting after your treatment, we encourage you to take your nausea medication as directed.  BELOW ARE SYMPTOMS THAT SHOULD BE REPORTED IMMEDIATELY: *FEVER GREATER THAN 100.4 F (38 C) OR HIGHER *CHILLS OR SWEATING *NAUSEA AND VOMITING THAT IS NOT CONTROLLED WITH YOUR NAUSEA MEDICATION *UNUSUAL SHORTNESS OF BREATH *UNUSUAL BRUISING OR BLEEDING *URINARY PROBLEMS (pain or burning when urinating, or frequent urination) *BOWEL PROBLEMS (unusual diarrhea, constipation, pain near the anus) TENDERNESS IN MOUTH AND THROAT WITH OR WITHOUT PRESENCE OF ULCERS (sore throat, sores in mouth, or a toothache) UNUSUAL RASH, SWELLING OR PAIN  UNUSUAL VAGINAL DISCHARGE OR ITCHING   Items with * indicate a potential emergency and should be followed up as soon as possible or go to the Emergency Department if any problems should occur.  Please show the CHEMOTHERAPY ALERT CARD or IMMUNOTHERAPY ALERT CARD at  check-in to the Emergency Department and triage nurse.  Should you have questions after your visit or need to cancel or reschedule your appointment, please contact [facility]  Dept: [phone]  and follow the prompts.  Office hours are 8:00 a.m. to 4:30 p.m. Monday - Friday. Please note that voicemails left after 4:00 p.m. may not be returned until the following business day.  We are closed weekends and major holidays. You have access to a nurse at all times for urgent questions. Please call the main number to the clinic Dept: [phone] and follow the prompts.   For any non-urgent questions, you may also contact your provider using MyChart. We now offer e-Visits for anyone 61 and older to request care online for non-urgent symptoms. For details visit [email or web address].   Also download the MyChart app! Go to the app store, search "MyChart", open the app, select [facility], and log in with your MyChart username and password.  Due to Covid, a mask is required upon entering the hospital/clinic. If you do not have a mask, one will be given to you upon arrival. For doctor visits, patients may have 1 support person aged 72 or older with them. For treatment visits, patients cannot have anyone with them due to current Covid guidelines and our immunocompromised population.

## 2021-03-08 NOTE — Progress Notes (Signed)
[facility] [phone]   [phone]  OFFICE PROGRESS NOTE  [name], [place] [address]  DIAGNOSIS: Stage IV (T2a, N0, M1b) non-small cell lung cancer, adenocarcinoma with negative EGFR and ALK mutations diagnosed in January 2015 and presented with right upper lobe lung mass in addition to a solitary brain metastasis.  PRIOR THERAPY: 1) Status post stereotactic radiotherapy to a solitary right parietal brain lesion under the care of Dr. [name] on 10/16/2013. 2) Status post palliative radiotherapy to the right lung tumor under the care of Dr. [name] completed on 12/05/2013. 3) Systemic chemotherapy with carboplatin for AUC of 5 and Alimta 500 mg/M2 every 3 weeks. First dose Jan 06 2014. Status post 6 cycles.  CURRENT THERAPY: Systemic chemotherapy with maintenance Alimta 500 MG/M2 every 3 weeks, status post 117 cycles.  INTERVAL HISTORY: Alexis [name] 70 y.o. female returns to the clinic today for follow-up visit.  The patient is feeling fine today with no concerning complaints.  She denied having any current chest pain, shortness of breath, cough or hemoptysis.  She denied having any fever or chills.  She has no nausea, vomiting, diarrhea or constipation.  She has no headache or visual changes.  The patient denied having any weight loss or night sweats.  She continues to tolerate her maintenance treatment with Alimta fairly well.  She is here today for evaluation before starting cycle #118.  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  MEDICATIONS:  Current  Outpatient Medications  Medication Sig Dispense Refill   acetaminophen (TYLENOL) 500 MG tablet Take 500 mg by mouth every 6 (six) hours as needed for mild pain or headache. Reported on 11/02/2015     ALPRAZolam (XANAX) 1 MG tablet SMARTSIG:1 Tablet(s) By Mouth 1 to 3 Times Daily PRN     Ascorbic Acid (VITAMIN C GUMMIE PO) Take 1 each by mouth every morning.     aspirin EC 81 MG tablet Take 1 tablet (81 mg total) by mouth daily. 150 tablet 2   [employer] ACID CONTROLLER 10 MG tablet SMARTSIG:1 Tablet(s) By Mouth Every 12 Hours PRN     dexamethasone (DECADRON) 4 MG tablet Take 1 tablet twice a day the day before, the day of, and the day after chemotherapy. 40 tablet 2   folic acid (FOLVITE) 1 MG tablet TAKE 1 TABLET BY MOUTH EVERY DAY 90 tablet 0   HYDROcodone-acetaminophen (NORCO/VICODIN) 5-325 MG tablet TAKE 1 TABLET BY MOUTH 3 TIMES A DAY FOR 5 DAYS AS NEEDED FOR PAIN     loratadine (CLARITIN) 10 MG tablet Take 10 mg by mouth daily.     metFORMIN (GLUCOPHAGE) 500 MG tablet Take 500 mg by mouth 2 (two) times daily.     Multiple Vitamin (MULTIVITAMIN WITH MINERALS) TABS tablet Take 1 tablet by mouth every morning.     nitrofurantoin, macrocrystal-monohydrate, (MACROBID) 100 MG capsule Take 1 capsule (100 mg total) by mouth 2 (two) times daily. 10 capsule 0   omeprazole (PRILOSEC) 20 MG capsule TAKE 1 CAPSULE BY  MOUTH EVERY DAY 90 capsule 2   ondansetron (ZOFRAN) 8 MG tablet TAKE 1 TABLET BY MOUTH BEFORE CHEMO 30 tablet 0   OVER THE COUNTER MEDICATION Take 1 tablet by mouth every morning. (Vitamin A)     prochlorperazine (COMPAZINE) 10 MG tablet Take 1 tablet (10 mg total) by mouth every 6 (six) hours as needed for nausea or vomiting. 60 tablet 0   rosuvastatin (CRESTOR) 10 MG tablet Take 1 tablet (10 mg total) by mouth daily. 30 tablet 12   senna-docusate (SENOKOT-S) 8.6-50 MG tablet Take 1 tablet by mouth daily. 30 tablet prn   valACYclovir (VALTREX) 1000 MG tablet Take 1,000 mg by mouth 3 (three) times  daily.     Vitamin D, Ergocalciferol, (DRISDOL) 1.25 MG (50000 UNIT) CAPS capsule Take 50,000 Units by mouth once a week.     No current facility-administered medications for this visit.   Facility-Administered Medications Ordered in Other Visits  Medication Dose Route Frequency Provider Last Rate Last Admin   sodium chloride flush (NS) 0.9 % injection 10 mL  10 mL Intravenous PRN Curt Bears, MD       sodium chloride flush (NS) 0.9 % injection 10 mL  10 mL Intravenous PRN Curt Bears, MD        SURGICAL HISTORY:  Past Surgical History:  Procedure Laterality Date   ABDOMINAL HYSTERECTOMY     COLOSTOMY TAKEDOWN N/A 07/10/2014   Procedure: LAPAROSCOPIC LYSIS OF ADHESIONS (90 MIN) LAPAROSCOPIC ASSISTED COLOSTOMY CLOSURE, RIGID PROCTOSIGMOIDOSCOPY;  Surgeon: Jackolyn Confer, MD;  Location: [facility];  Service: General;  Laterality: N/A;   LAPAROTOMY N/A 11/03/2013   Procedure: EXPLORATORY LAPAROTOMY, DRAINAGE OF INTRA  ABDOMINAL ABSCESSES, MOBILIZATION OF SPLENIC FLEXURE, SIGMOID COLECTOMY WITH COLOSTOMY;  Surgeon: Odis Hollingshead, MD;  Location: [facility];  Service: General;  Laterality: N/A;   VIDEO BRONCHOSCOPY Bilateral 08/30/2013   Procedure: VIDEO BRONCHOSCOPY WITH FLUORO;  Surgeon: Tanda Rockers, MD;  Location: [facility];  Service: Cardiopulmonary;  Laterality: Bilateral;    REVIEW OF SYSTEMS:  A comprehensive review of systems was negative.   PHYSICAL EXAMINATION: General appearance: alert, cooperative, and no distress Head: Normocephalic, without obvious abnormality, atraumatic Neck: no adenopathy, no JVD, supple, symmetrical, trachea midline, and thyroid not enlarged, symmetric, no tenderness/mass/nodules Lymph nodes: Cervical, supraclavicular, and axillary nodes normal. Resp: clear to auscultation bilaterally Back: symmetric, no curvature. ROM normal. No CVA tenderness. Cardio: regular rate and rhythm, S1, S2 normal, no murmur, click, rub or gallop GI: soft, non-tender;  bowel sounds normal; no masses,  no organomegaly Extremities: extremities normal, atraumatic, no cyanosis or edema   ECOG PERFORMANCE STATUS: 1 - Symptomatic but completely ambulatory  Blood pressure 122/74, pulse 96, temperature 97.8 F (36.6 C), temperature source Oral, resp. rate 18, weight 168 lb 14.4 oz (76.6 kg), SpO2 99 %.  LABORATORY DATA: Lab Results  Component Value Date   WBC 7.5 02/15/2021   HGB 11.5 (L) 02/15/2021   HCT 35.6 (L) 02/15/2021   MCV 97.5 02/15/2021   PLT 294 02/15/2021      Chemistry      Component Value Date/Time   NA 137 02/15/2021 0855   NA 139 08/14/2017 0837   K 4.2 02/15/2021 0855   K 4.4 08/14/2017 0837   CL 103 02/15/2021 0855   CO2 24 02/15/2021 0855   CO2 24 08/14/2017 0837   BUN 18 02/15/2021 0855   BUN 13.3 08/14/2017 0837   CREATININE 0.94 02/15/2021 0855   CREATININE 0.8 08/14/2017 6606  Component Value Date/Time   CALCIUM 9.9 02/15/2021 0855   CALCIUM 9.3 08/14/2017 0837   ALKPHOS 73 02/15/2021 0855   ALKPHOS 107 08/14/2017 0837   AST 18 02/15/2021 0855   AST 12 08/14/2017 0837   ALT 15 02/15/2021 0855   ALT 12 08/14/2017 0837   BILITOT 0.5 02/15/2021 0855   BILITOT 0.37 08/14/2017 0837       RADIOGRAPHIC STUDIES: CT Chest W Contrast  Result Date: 02/12/2021 CLINICAL DATA:  Restaging metastatic non-small cell lung cancer. EXAM: CT CHEST, ABDOMEN, AND PELVIS WITH CONTRAST TECHNIQUE: Multidetector CT imaging of the chest, abdomen and pelvis was performed following the standard protocol during bolus administration of intravenous contrast. CONTRAST:  135m ISOVUE-300 IOPAMIDOL (ISOVUE-300) INJECTION 61% COMPARISON:  11/04/2020 FINDINGS: CT CHEST FINDINGS Cardiovascular: Normal heart size. No pericardial effusion. Aortic atherosclerosis. Mediastinum/Nodes: Normal appearance of the thyroid gland. The trachea appears patent and is midline. Normal appearance of the esophagus. No enlarged supraclavicular or axillary lymph nodes.  No mediastinal or hilar adenopathy. Lungs/Pleura: No pleural effusion. Moderate to advanced changes of paraseptal and centrilobular emphysema with diffuse bronchial wall thickening. Paramediastinal fibrosis and masslike architectural distortion is identified within the right upper lobe and right apex. 6 mm ground-glass nodule in the posterior right lower lobe, image 96/5. Stable. 4 mm subpleural nodule in the anterior basal right middle lobe, image 112/5. Stable. 2 mm nodule in the posterior left upper lobe, image 66/5. New from previous exam. Musculoskeletal: No chest wall mass or suspicious bone lesions identified. CT ABDOMEN PELVIS FINDINGS Hepatobiliary: No focal liver abnormality is seen. No gallstones, gallbladder wall thickening, or biliary dilatation. Pancreas: Unremarkable. No pancreatic ductal dilatation or surrounding inflammatory changes. Spleen: Normal in size without focal abnormality. Adrenals/Urinary Tract: Normal adrenal glands. No kidney mass or hydronephrosis. Mild diffuse bladder wall thickening and mucosal enhancement appears new from previous exam. No focal bladder lesion identified. Stomach/Bowel: Stomach is within normal limits. Appendix normal. No bowel wall thickening, inflammation or distension. Vascular/Lymphatic: Aortic atherosclerosis. No aneurysm. No abdominopelvic adenopathy. Reproductive: Status post hysterectomy. No adnexal masses. Other: No free fluid or fluid collections. Supraumbilical ventral abdominal wall hernia contains fat only. There is also a small fat containing umbilical hernia. Infraumbilical hernia along the midline also contains fat only measuring 3.8 cm, image 91/2. Musculoskeletal: No acute or suspicious osseous findings. IMPRESSION: 1. Stable post treatment changes within the right upper lobe and right apex. No specific findings identified to suggest locally recurrent tumor or metastatic disease. 2. There is a new 2 mm nodule in the posterior left upper lobe. This  is a nonspecific finding. Attention on follow-up imaging advised. 3. Unchanged solid subpleural nodule in the right middle lobe and non solid nodule identified in the right lower lobe. 4. There is new mild bladder wall thickening with diffuse mucosal enhancement. Correlate for any clinical signs or symptoms of cystitis. 5. Diffuse bronchial wall thickening with emphysema, as above; imaging findings suggestive of underlying COPD. 6. Emphysema and aortic atherosclerosis. Aortic Atherosclerosis (ICD10-I70.0) and Emphysema (ICD10-J43.9). Electronically Signed   By: TKerby MoorsM.D.   On: 02/12/2021 06:45   CT Abdomen Pelvis W Contrast  Result Date: 02/12/2021 CLINICAL DATA:  Restaging metastatic non-small cell lung cancer. EXAM: CT CHEST, ABDOMEN, AND PELVIS WITH CONTRAST TECHNIQUE: Multidetector CT imaging of the chest, abdomen and pelvis was performed following the standard protocol during bolus administration of intravenous contrast. CONTRAST:  1065mISOVUE-300 IOPAMIDOL (ISOVUE-300) INJECTION 61% COMPARISON:  11/04/2020 FINDINGS: CT CHEST FINDINGS Cardiovascular: Normal heart size.  No pericardial effusion. Aortic atherosclerosis. Mediastinum/Nodes: Normal appearance of the thyroid gland. The trachea appears patent and is midline. Normal appearance of the esophagus. No enlarged supraclavicular or axillary lymph nodes. No mediastinal or hilar adenopathy. Lungs/Pleura: No pleural effusion. Moderate to advanced changes of paraseptal and centrilobular emphysema with diffuse bronchial wall thickening. Paramediastinal fibrosis and masslike architectural distortion is identified within the right upper lobe and right apex. 6 mm ground-glass nodule in the posterior right lower lobe, image 96/5. Stable. 4 mm subpleural nodule in the anterior basal right middle lobe, image 112/5. Stable. 2 mm nodule in the posterior left upper lobe, image 66/5. New from previous exam. Musculoskeletal: No chest wall mass or suspicious  bone lesions identified. CT ABDOMEN PELVIS FINDINGS Hepatobiliary: No focal liver abnormality is seen. No gallstones, gallbladder wall thickening, or biliary dilatation. Pancreas: Unremarkable. No pancreatic ductal dilatation or surrounding inflammatory changes. Spleen: Normal in size without focal abnormality. Adrenals/Urinary Tract: Normal adrenal glands. No kidney mass or hydronephrosis. Mild diffuse bladder wall thickening and mucosal enhancement appears new from previous exam. No focal bladder lesion identified. Stomach/Bowel: Stomach is within normal limits. Appendix normal. No bowel wall thickening, inflammation or distension. Vascular/Lymphatic: Aortic atherosclerosis. No aneurysm. No abdominopelvic adenopathy. Reproductive: Status post hysterectomy. No adnexal masses. Other: No free fluid or fluid collections. Supraumbilical ventral abdominal wall hernia contains fat only. There is also a small fat containing umbilical hernia. Infraumbilical hernia along the midline also contains fat only measuring 3.8 cm, image 91/2. Musculoskeletal: No acute or suspicious osseous findings. IMPRESSION: 1. Stable post treatment changes within the right upper lobe and right apex. No specific findings identified to suggest locally recurrent tumor or metastatic disease. 2. There is a new 2 mm nodule in the posterior left upper lobe. This is a nonspecific finding. Attention on follow-up imaging advised. 3. Unchanged solid subpleural nodule in the right middle lobe and non solid nodule identified in the right lower lobe. 4. There is new mild bladder wall thickening with diffuse mucosal enhancement. Correlate for any clinical signs or symptoms of cystitis. 5. Diffuse bronchial wall thickening with emphysema, as above; imaging findings suggestive of underlying COPD. 6. Emphysema and aortic atherosclerosis. Aortic Atherosclerosis (ICD10-I70.0) and Emphysema (ICD10-J43.9). Electronically Signed   By: Kerby Moors M.D.   On:  02/12/2021 06:45    ASSESSMENT AND PLAN:  This is a very pleasant 70 years old white female with metastatic non-small cell lung cancer, adenocarcinoma status post induction systemic chemotherapy with carboplatin and Alimta with partial response. The patient is currently on maintenance treatment with single agent Alimta status post 117 cycles. The patient has been tolerating her treatment well with no concerning adverse effects. I recommended for her to proceed with cycle #118 today as planned. She will come back for follow-up visit in 3 weeks for evaluation before the next cycle of her treatment. For the hypertension, the patient will continue to monitor her blood pressure closely at home. For the anxiety she is currently on Xanax. She was advised to call immediately if she has any other concerning symptoms in the interval. The patient voices understanding of current disease status and treatment options and is in agreement with the current care plan. All questions were answered. The patient knows to call the clinic with any problems, questions or concerns. We can certainly see the patient much sooner if necessary.  Disclaimer: This note was dictated with voice recognition software. Similar sounding words can inadvertently be transcribed and may not be corrected upon review.

## 2021-03-30 ENCOUNTER — Other Ambulatory Visit: Payer: Self-pay

## 2021-03-30 ENCOUNTER — Inpatient Hospital Stay: Payer: PPO

## 2021-03-30 ENCOUNTER — Inpatient Hospital Stay: Payer: PPO | Attending: Internal Medicine | Admitting: Internal Medicine

## 2021-03-30 ENCOUNTER — Other Ambulatory Visit: Payer: PPO

## 2021-03-30 ENCOUNTER — Encounter: Payer: Self-pay | Admitting: Internal Medicine

## 2021-03-30 VITALS — BP 130/66 | HR 102 | Temp 97.1°F | Resp 20 | Ht 64.0 in | Wt 167.2 lb

## 2021-03-30 VITALS — HR 77

## 2021-03-30 DIAGNOSIS — C3411 Malignant neoplasm of upper lobe, right bronchus or lung: Secondary | ICD-10-CM | POA: Diagnosis not present

## 2021-03-30 DIAGNOSIS — F419 Anxiety disorder, unspecified: Secondary | ICD-10-CM | POA: Insufficient documentation

## 2021-03-30 DIAGNOSIS — Z5112 Encounter for antineoplastic immunotherapy: Secondary | ICD-10-CM | POA: Diagnosis not present

## 2021-03-30 DIAGNOSIS — I10 Essential (primary) hypertension: Secondary | ICD-10-CM | POA: Insufficient documentation

## 2021-03-30 DIAGNOSIS — Z79899 Other long term (current) drug therapy: Secondary | ICD-10-CM | POA: Diagnosis not present

## 2021-03-30 DIAGNOSIS — Z923 Personal history of irradiation: Secondary | ICD-10-CM | POA: Insufficient documentation

## 2021-03-30 DIAGNOSIS — Z9221 Personal history of antineoplastic chemotherapy: Secondary | ICD-10-CM | POA: Insufficient documentation

## 2021-03-30 DIAGNOSIS — Z5111 Encounter for antineoplastic chemotherapy: Secondary | ICD-10-CM

## 2021-03-30 LAB — CBC WITH DIFFERENTIAL (CANCER CENTER ONLY)
Abs Immature Granulocytes: 0.08 10*3/uL — ABNORMAL HIGH (ref 0.00–0.07)
Basophils Absolute: 0 10*3/uL (ref 0.0–0.1)
Basophils Relative: 0 %
Eosinophils Absolute: 0 10*3/uL (ref 0.0–0.5)
Eosinophils Relative: 0 %
HCT: 35.8 % — ABNORMAL LOW (ref 36.0–46.0)
Hemoglobin: 11.4 g/dL — ABNORMAL LOW (ref 12.0–15.0)
Immature Granulocytes: 1 %
Lymphocytes Relative: 6 %
Lymphs Abs: 0.6 10*3/uL — ABNORMAL LOW (ref 0.7–4.0)
MCH: 31.1 pg (ref 26.0–34.0)
MCHC: 31.8 g/dL (ref 30.0–36.0)
MCV: 97.8 fL (ref 80.0–100.0)
Monocytes Absolute: 0.7 10*3/uL (ref 0.1–1.0)
Monocytes Relative: 8 %
Neutro Abs: 7.5 10*3/uL (ref 1.7–7.7)
Neutrophils Relative %: 85 %
Platelet Count: 367 10*3/uL (ref 150–400)
RBC: 3.66 MIL/uL — ABNORMAL LOW (ref 3.87–5.11)
RDW: 14.9 % (ref 11.5–15.5)
WBC Count: 8.8 10*3/uL (ref 4.0–10.5)
nRBC: 0 % (ref 0.0–0.2)

## 2021-03-30 LAB — CMP (CANCER CENTER ONLY)
ALT: 11 U/L (ref 0–44)
AST: 11 U/L — ABNORMAL LOW (ref 15–41)
Albumin: 3.5 g/dL (ref 3.5–5.0)
Alkaline Phosphatase: 78 U/L (ref 38–126)
Anion gap: 10 (ref 5–15)
BUN: 20 mg/dL (ref 8–23)
CO2: 26 mmol/L (ref 22–32)
Calcium: 10.1 mg/dL (ref 8.9–10.3)
Chloride: 103 mmol/L (ref 98–111)
Creatinine: 0.84 mg/dL (ref 0.44–1.00)
GFR, Estimated: >60 mL/min (ref >60)
Glucose, Bld: 128 mg/dL — ABNORMAL HIGH (ref 70–99)
Potassium: 4.4 mmol/L (ref 3.5–5.1)
Sodium: 139 mmol/L (ref 135–145)
Total Bilirubin: 0.4 mg/dL (ref 0.3–1.2)
Total Protein: 6.9 g/dL (ref 6.5–8.1)

## 2021-03-30 MED ORDER — CYANOCOBALAMIN 1000 MCG/ML IJ SOLN
INTRAMUSCULAR | Status: AC
  Filled 2021-03-30: qty 1

## 2021-03-30 MED ORDER — SODIUM CHLORIDE 0.9 % IV SOLN
10.0000 mg | Freq: Once | INTRAVENOUS | Status: AC
  Administered 2021-03-30: 10 mg via INTRAVENOUS
  Filled 2021-03-30: qty 10

## 2021-03-30 MED ORDER — SODIUM CHLORIDE 0.9 % IV SOLN
Freq: Once | INTRAVENOUS | Status: AC
  Filled 2021-03-30: qty 250

## 2021-03-30 MED ORDER — CYANOCOBALAMIN 1000 MCG/ML IJ SOLN
1000.0000 ug | Freq: Once | INTRAMUSCULAR | Status: AC
  Administered 2021-03-30: 1000 ug via INTRAMUSCULAR

## 2021-03-30 MED ORDER — SODIUM CHLORIDE 0.9 % IV SOLN
500.0000 mg/m2 | Freq: Once | INTRAVENOUS | Status: AC
  Administered 2021-03-30: 900 mg via INTRAVENOUS
  Filled 2021-03-30: qty 16

## 2021-03-30 NOTE — Patient Instructions (Signed)
[facility]  Discharge Instructions: Thank you for choosing [facility] to provide your oncology and hematology care.   If you have a lab appointment with the [facility], please go directly to the [facility] and check in at the registration area.   Wear comfortable clothing and clothing appropriate for easy access to any Portacath or PICC line.   We strive to give you quality time with your provider. You may need to reschedule your appointment if you arrive late (15 or more minutes).  Arriving late affects you and other patients whose appointments are after yours.  Also, if you miss three or more appointments without notifying the office, you may be dismissed from the clinic at the provider's discretion.      For prescription refill requests, have your pharmacy contact our office and allow 72 hours for refills to be completed.    Today you received the following chemotherapy and/or immunotherapy agent: Pemetrexed (Alimta).    To help prevent nausea and vomiting after your treatment, we encourage you to take your nausea medication as directed.  BELOW ARE SYMPTOMS THAT SHOULD BE REPORTED IMMEDIATELY: *FEVER GREATER THAN 100.4 F (38 C) OR HIGHER *CHILLS OR SWEATING *NAUSEA AND VOMITING THAT IS NOT CONTROLLED WITH YOUR NAUSEA MEDICATION *UNUSUAL SHORTNESS OF BREATH *UNUSUAL BRUISING OR BLEEDING *URINARY PROBLEMS (pain or burning when urinating, or frequent urination) *BOWEL PROBLEMS (unusual diarrhea, constipation, pain near the anus) TENDERNESS IN MOUTH AND THROAT WITH OR WITHOUT PRESENCE OF ULCERS (sore throat, sores in mouth, or a toothache) UNUSUAL RASH, SWELLING OR PAIN  UNUSUAL VAGINAL DISCHARGE OR ITCHING   Items with * indicate a potential emergency and should be followed up as soon as possible or go to the Emergency Department if any problems should occur.  Please show the CHEMOTHERAPY ALERT CARD or IMMUNOTHERAPY ALERT CARD at  check-in to the Emergency Department and triage nurse.  Should you have questions after your visit or need to cancel or reschedule your appointment, please contact [facility]  Dept: [phone]  and follow the prompts.  Office hours are 8:00 a.m. to 4:30 p.m. Monday - Friday. Please note that voicemails left after 4:00 p.m. may not be returned until the following business day.  We are closed weekends and major holidays. You have access to a nurse at all times for urgent questions. Please call the main number to the clinic Dept: [phone] and follow the prompts.   For any non-urgent questions, you may also contact your provider using MyChart. We now offer e-Visits for anyone 64 and older to request care online for non-urgent symptoms. For details visit [email or web address].   Also download the MyChart app! Go to the app store, search "MyChart", open the app, select [facility], and log in with your MyChart username and password.  Due to Covid, a mask is required upon entering the hospital/clinic. If you do not have a mask, one will be given to you upon arrival. For doctor visits, patients may have 1 support person aged 27 or older with them. For treatment visits, patients cannot have anyone with them due to current Covid guidelines and our immunocompromised population.

## 2021-03-30 NOTE — Progress Notes (Signed)
[facility] [phone]   [phone]  OFFICE PROGRESS NOTE  [name], [place] [address]  DIAGNOSIS: Stage IV (T2a, N0, M1b) non-small cell lung cancer, adenocarcinoma with negative EGFR and ALK mutations diagnosed in January 2015 and presented with right upper lobe lung mass in addition to a solitary brain metastasis.  PRIOR THERAPY: 1) Status post stereotactic radiotherapy to a solitary right parietal brain lesion under the care of Dr. [name] on 10/16/2013. 2) Status post palliative radiotherapy to the right lung tumor under the care of Dr. [name] completed on 12/05/2013. 3) Systemic chemotherapy with carboplatin for AUC of 5 and Alimta 500 mg/M2 every 3 weeks. First dose Jan 06 2014. Status post 6 cycles.  CURRENT THERAPY: Systemic chemotherapy with maintenance Alimta 500 MG/M2 every 3 weeks, status post 118 cycles.  INTERVAL HISTORY: Alexis [name] 70 y.o. female returns to the clinic today for follow-up visit.  The patient is feeling fine today with no concerning complaints.  She has no current chest pain, shortness of breath, cough or hemoptysis.  She denied having any fever or chills.  She has no nausea, vomiting, diarrhea or constipation.  She has no headache or visual changes.  She has been tolerating her treatment with maintenance Alimta fairly well.  She is here today for evaluation before starting cycle #119.  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  MEDICATIONS:  Current Outpatient Medications  Medication Sig Dispense Refill   acetaminophen  (TYLENOL) 500 MG tablet Take 500 mg by mouth every 6 (six) hours as needed for mild pain or headache. Reported on 11/02/2015     ALPRAZolam (XANAX) 1 MG tablet SMARTSIG:1 Tablet(s) By Mouth 1 to 3 Times Daily PRN     Ascorbic Acid (VITAMIN C GUMMIE PO) Take 1 each by mouth every morning.     aspirin EC 81 MG tablet Take 1 tablet (81 mg total) by mouth daily. 150 tablet 2   [employer] ACID CONTROLLER 10 MG tablet SMARTSIG:1 Tablet(s) By Mouth Every 12 Hours PRN     dexamethasone (DECADRON) 4 MG tablet Take 1 tablet twice a day the day before, the day of, and the day after chemotherapy. 40 tablet 2   folic acid (FOLVITE) 1 MG tablet TAKE 1 TABLET BY MOUTH EVERY DAY 90 tablet 0   HYDROcodone-acetaminophen (NORCO/VICODIN) 5-325 MG tablet TAKE 1 TABLET BY MOUTH 3 TIMES A DAY FOR 5 DAYS AS NEEDED FOR PAIN     loratadine (CLARITIN) 10 MG tablet Take 10 mg by mouth daily.     metFORMIN (GLUCOPHAGE) 500 MG tablet Take 500 mg by mouth 2 (two) times daily.     Multiple Vitamin (MULTIVITAMIN WITH MINERALS) TABS tablet Take 1 tablet by mouth every morning.     nitrofurantoin, macrocrystal-monohydrate, (MACROBID) 100 MG capsule Take 1 capsule (100 mg total) by mouth 2 (two) times daily. 10 capsule 0   omeprazole (PRILOSEC) 20 MG capsule TAKE 1 CAPSULE BY MOUTH EVERY DAY 90 capsule 2   ondansetron (ZOFRAN) 8 MG  tablet TAKE 1 TABLET BY MOUTH BEFORE CHEMO 30 tablet 0   OVER THE COUNTER MEDICATION Take 1 tablet by mouth every morning. (Vitamin A)     prochlorperazine (COMPAZINE) 10 MG tablet Take 1 tablet (10 mg total) by mouth every 6 (six) hours as needed for nausea or vomiting. 60 tablet 0   rosuvastatin (CRESTOR) 10 MG tablet Take 1 tablet (10 mg total) by mouth daily. 30 tablet 12   senna-docusate (SENOKOT-S) 8.6-50 MG tablet Take 1 tablet by mouth daily. 30 tablet prn   valACYclovir (VALTREX) 1000 MG tablet Take 1,000 mg by mouth 3 (three) times daily.     Vitamin D, Ergocalciferol, (DRISDOL) 1.25 MG (50000 UNIT) CAPS  capsule Take 50,000 Units by mouth once a week.     No current facility-administered medications for this visit.   Facility-Administered Medications Ordered in Other Visits  Medication Dose Route Frequency Provider Last Rate Last Admin   sodium chloride flush (NS) 0.9 % injection 10 mL  10 mL Intravenous PRN Curt Bears, MD       sodium chloride flush (NS) 0.9 % injection 10 mL  10 mL Intravenous PRN Curt Bears, MD        SURGICAL HISTORY:  Past Surgical History:  Procedure Laterality Date   ABDOMINAL HYSTERECTOMY     COLOSTOMY TAKEDOWN N/A 07/10/2014   Procedure: LAPAROSCOPIC LYSIS OF ADHESIONS (90 MIN) LAPAROSCOPIC ASSISTED COLOSTOMY CLOSURE, RIGID PROCTOSIGMOIDOSCOPY;  Surgeon: Jackolyn Confer, MD;  Location: [facility];  Service: General;  Laterality: N/A;   LAPAROTOMY N/A 11/03/2013   Procedure: EXPLORATORY LAPAROTOMY, DRAINAGE OF INTRA  ABDOMINAL ABSCESSES, MOBILIZATION OF SPLENIC FLEXURE, SIGMOID COLECTOMY WITH COLOSTOMY;  Surgeon: Odis Hollingshead, MD;  Location: [facility];  Service: General;  Laterality: N/A;   VIDEO BRONCHOSCOPY Bilateral 08/30/2013   Procedure: VIDEO BRONCHOSCOPY WITH FLUORO;  Surgeon: Tanda Rockers, MD;  Location: [facility];  Service: Cardiopulmonary;  Laterality: Bilateral;    REVIEW OF SYSTEMS:  A comprehensive review of systems was negative.   PHYSICAL EXAMINATION: General appearance: alert, cooperative, and no distress Head: Normocephalic, without obvious abnormality, atraumatic Neck: no adenopathy, no JVD, supple, symmetrical, trachea midline, and thyroid not enlarged, symmetric, no tenderness/mass/nodules Lymph nodes: Cervical, supraclavicular, and axillary nodes normal. Resp: clear to auscultation bilaterally Back: symmetric, no curvature. ROM normal. No CVA tenderness. Cardio: regular rate and rhythm, S1, S2 normal, no murmur, click, rub or gallop GI: soft, non-tender; bowel sounds normal; no masses,  no organomegaly Extremities: extremities  normal, atraumatic, no cyanosis or edema   ECOG PERFORMANCE STATUS: 0 - Asymptomatic  Blood pressure 130/66, pulse (!) 102, temperature (!) 97.1 F (36.2 C), temperature source Tympanic, resp. rate 20, height _0  (1.626 m), weight 167 lb 3.2 oz (75.8 kg), SpO2 99 %.  LABORATORY DATA: Lab Results  Component Value Date   WBC 9.8 03/08/2021   HGB 11.7 (L) 03/08/2021   HCT 36.5 03/08/2021   MCV 97.3 03/08/2021   PLT 356 03/08/2021      Chemistry      Component Value Date/Time   NA 139 03/08/2021 0821   NA 139 08/14/2017 0837   K 4.4 03/08/2021 0821   K 4.4 08/14/2017 0837   CL 104 03/08/2021 0821   CO2 25 03/08/2021 0821   CO2 24 08/14/2017 0837   BUN 18 03/08/2021 0821   BUN 13.3 08/14/2017 0837   CREATININE 0.80 03/08/2021 0821   CREATININE 0.8 08/14/2017 0837      Component Value Date/Time   CALCIUM  9.8 03/08/2021 0821   CALCIUM 9.3 08/14/2017 0837   ALKPHOS 76 03/08/2021 0821   ALKPHOS 107 08/14/2017 0837   AST 10 (L) 03/08/2021 0821   AST 12 08/14/2017 0837   ALT 8 03/08/2021 0821   ALT 12 08/14/2017 0837   BILITOT 0.3 03/08/2021 0821   BILITOT 0.37 08/14/2017 0837       RADIOGRAPHIC STUDIES: No results found.  ASSESSMENT AND PLAN:  This is a very pleasant 70 years old white female with metastatic non-small cell lung cancer, adenocarcinoma status post induction systemic chemotherapy with carboplatin and Alimta with partial response. The patient is currently on maintenance treatment with single agent Alimta status post 118 cycles. The patient continues to tolerate this treatment well with no concerning adverse effects. I recommended for her to proceed with cycle #119 today as planned. For the hypertension, she will continue with her current blood pressure medication.  It is much better controlled now. For the anxiety she is currently on Xanax. The patient will come back for follow-up visit in 3 weeks for evaluation before the next cycle of her  treatment. She was advised to call immediately if she has any concerning symptoms in the interval. The patient voices understanding of current disease status and treatment options and is in agreement with the current care plan. All questions were answered. The patient knows to call the clinic with any problems, questions or concerns. We can certainly see the patient much sooner if necessary.  Disclaimer: This note was dictated with voice recognition software. Similar sounding words can inadvertently be transcribed and may not be corrected upon review.

## 2021-03-30 NOTE — Progress Notes (Signed)
Per Dr Julien Nordmann ,it is okay to treat pt today with Alimta and heart rate of 102.

## 2021-04-07 DIAGNOSIS — N3091 Cystitis, unspecified with hematuria: Secondary | ICD-10-CM | POA: Diagnosis not present

## 2021-04-07 DIAGNOSIS — N3001 Acute cystitis with hematuria: Secondary | ICD-10-CM | POA: Diagnosis not present

## 2021-04-20 ENCOUNTER — Encounter: Payer: Self-pay | Admitting: Internal Medicine

## 2021-04-20 ENCOUNTER — Other Ambulatory Visit: Payer: Self-pay

## 2021-04-20 ENCOUNTER — Inpatient Hospital Stay: Payer: PPO

## 2021-04-20 ENCOUNTER — Inpatient Hospital Stay (HOSPITAL_BASED_OUTPATIENT_CLINIC_OR_DEPARTMENT_OTHER): Payer: PPO | Admitting: Internal Medicine

## 2021-04-20 ENCOUNTER — Encounter: Payer: Self-pay | Admitting: *Deleted

## 2021-04-20 VITALS — BP 113/68 | HR 95 | Temp 97.5°F | Resp 16 | Ht 64.0 in | Wt 162.7 lb

## 2021-04-20 DIAGNOSIS — D6481 Anemia due to antineoplastic chemotherapy: Secondary | ICD-10-CM

## 2021-04-20 DIAGNOSIS — C3411 Malignant neoplasm of upper lobe, right bronchus or lung: Secondary | ICD-10-CM | POA: Diagnosis not present

## 2021-04-20 DIAGNOSIS — Z5112 Encounter for antineoplastic immunotherapy: Secondary | ICD-10-CM | POA: Diagnosis not present

## 2021-04-20 DIAGNOSIS — Z5111 Encounter for antineoplastic chemotherapy: Secondary | ICD-10-CM

## 2021-04-20 DIAGNOSIS — T451X5A Adverse effect of antineoplastic and immunosuppressive drugs, initial encounter: Secondary | ICD-10-CM | POA: Diagnosis not present

## 2021-04-20 LAB — COMPREHENSIVE METABOLIC PANEL
ALT: 11 U/L (ref 0–44)
AST: 17 U/L (ref 15–41)
Albumin: 3.3 g/dL — ABNORMAL LOW (ref 3.5–5.0)
Alkaline Phosphatase: 68 U/L (ref 38–126)
Anion gap: 14 (ref 5–15)
BUN: 18 mg/dL (ref 8–23)
CO2: 25 mmol/L (ref 22–32)
Calcium: 9.2 mg/dL (ref 8.9–10.3)
Chloride: 102 mmol/L (ref 98–111)
Creatinine, Ser: 1.02 mg/dL — ABNORMAL HIGH (ref 0.44–1.00)
GFR, Estimated: 60 mL/min — ABNORMAL LOW (ref >60)
Glucose, Bld: 121 mg/dL — ABNORMAL HIGH (ref 70–99)
Potassium: 3.5 mmol/L (ref 3.5–5.1)
Sodium: 141 mmol/L (ref 135–145)
Total Bilirubin: 0.4 mg/dL (ref 0.3–1.2)
Total Protein: 6.8 g/dL (ref 6.5–8.1)

## 2021-04-20 LAB — CBC WITH DIFFERENTIAL/PLATELET
Abs Immature Granulocytes: 0.03 10*3/uL (ref 0.00–0.07)
Basophils Absolute: 0 10*3/uL (ref 0.0–0.1)
Basophils Relative: 0 %
Eosinophils Absolute: 0 10*3/uL (ref 0.0–0.5)
Eosinophils Relative: 0 %
HCT: 36.5 % (ref 36.0–46.0)
Hemoglobin: 11.7 g/dL — ABNORMAL LOW (ref 12.0–15.0)
Immature Granulocytes: 1 %
Lymphocytes Relative: 9 %
Lymphs Abs: 0.4 10*3/uL — ABNORMAL LOW (ref 0.7–4.0)
MCH: 30.8 pg (ref 26.0–34.0)
MCHC: 32.1 g/dL (ref 30.0–36.0)
MCV: 96.1 fL (ref 80.0–100.0)
Monocytes Absolute: 1 10*3/uL (ref 0.1–1.0)
Monocytes Relative: 23 %
Neutro Abs: 3.1 10*3/uL (ref 1.7–7.7)
Neutrophils Relative %: 67 %
Platelets: 343 10*3/uL (ref 150–400)
RBC: 3.8 MIL/uL — ABNORMAL LOW (ref 3.87–5.11)
RDW: 15 % (ref 11.5–15.5)
WBC: 4.5 10*3/uL (ref 4.0–10.5)
nRBC: 0 % (ref 0.0–0.2)

## 2021-04-20 MED ORDER — SODIUM CHLORIDE 0.9 % IV SOLN: Freq: Once | INTRAVENOUS | Status: AC

## 2021-04-20 MED ORDER — SODIUM CHLORIDE 0.9 % IV SOLN
500.0000 mg/m2 | Freq: Once | INTRAVENOUS | Status: AC
  Administered 2021-04-20: 900 mg via INTRAVENOUS
  Filled 2021-04-20: qty 16

## 2021-04-20 MED ORDER — SODIUM CHLORIDE 0.9 % IV SOLN
10.0000 mg | Freq: Once | INTRAVENOUS | Status: AC
  Administered 2021-04-20: 10 mg via INTRAVENOUS
  Filled 2021-04-20: qty 10

## 2021-04-20 NOTE — Progress Notes (Signed)
I spoke with Ms. Alexis [name] today in clinic.  She told me about her the troubles her husband has been going threw.  I listened as she explained.  I offered her support and encouragement.

## 2021-04-20 NOTE — Patient Instructions (Signed)
[facility]  Discharge Instructions: Thank you for choosing [facility] to provide your oncology and hematology care.   If you have a lab appointment with the [facility], please go directly to the [facility] and check in at the registration area.   Wear comfortable clothing and clothing appropriate for easy access to any Portacath or PICC line.   We strive to give you quality time with your provider. You may need to reschedule your appointment if you arrive late (15 or more minutes).  Arriving late affects you and other patients whose appointments are after yours.  Also, if you miss three or more appointments without notifying the office, you may be dismissed from the clinic at the provider's discretion.      For prescription refill requests, have your pharmacy contact our office and allow 72 hours for refills to be completed.    Today you received the following chemotherapy and/or immunotherapy agent: Pemetrexed (Alimta).     To help prevent nausea and vomiting after your treatment, we encourage you to take your nausea medication as directed.  BELOW ARE SYMPTOMS THAT SHOULD BE REPORTED IMMEDIATELY: *FEVER GREATER THAN 100.4 F (38 C) OR HIGHER *CHILLS OR SWEATING *NAUSEA AND VOMITING THAT IS NOT CONTROLLED WITH YOUR NAUSEA MEDICATION *UNUSUAL SHORTNESS OF BREATH *UNUSUAL BRUISING OR BLEEDING *URINARY PROBLEMS (pain or burning when urinating, or frequent urination) *BOWEL PROBLEMS (unusual diarrhea, constipation, pain near the anus) TENDERNESS IN MOUTH AND THROAT WITH OR WITHOUT PRESENCE OF ULCERS (sore throat, sores in mouth, or a toothache) UNUSUAL RASH, SWELLING OR PAIN  UNUSUAL VAGINAL DISCHARGE OR ITCHING   Items with * indicate a potential emergency and should be followed up as soon as possible or go to the Emergency Department if any problems should occur.  Please show the CHEMOTHERAPY ALERT CARD or IMMUNOTHERAPY ALERT CARD at  check-in to the Emergency Department and triage nurse.  Should you have questions after your visit or need to cancel or reschedule your appointment, please contact [facility]  Dept: [phone]  and follow the prompts.  Office hours are 8:00 a.m. to 4:30 p.m. Monday - Friday. Please note that voicemails left after 4:00 p.m. may not be returned until the following business day.  We are closed weekends and major holidays. You have access to a nurse at all times for urgent questions. Please call the main number to the clinic Dept: [phone] and follow the prompts.   For any non-urgent questions, you may also contact your provider using MyChart. We now offer e-Visits for anyone 60 and older to request care online for non-urgent symptoms. For details visit [email or web address].   Also download the MyChart app! Go to the app store, search "MyChart", open the app, select [facility], and log in with your MyChart username and password.  Due to Covid, a mask is required upon entering the hospital/clinic. If you do not have a mask, one will be given to you upon arrival. For doctor visits, patients may have 1 support person aged 44 or older with them. For treatment visits, patients cannot have anyone with them due to current Covid guidelines and our immunocompromised population.

## 2021-04-20 NOTE — Progress Notes (Signed)
[facility] Telephone:([phone]) [phone]   Fax:(336) [phone]  OFFICE PROGRESS NOTE  [name], [place] [address]  DIAGNOSIS: Stage IV (T2a, N0, M1b) non-small cell lung cancer, adenocarcinoma with negative EGFR and ALK mutations diagnosed in January 2015 and presented with right upper lobe lung mass in addition to a solitary brain metastasis.  PRIOR THERAPY: 1) Status post stereotactic radiotherapy to a solitary right parietal brain lesion under the care of Dr. [name] on 10/16/2013. 2) Status post palliative radiotherapy to the right lung tumor under the care of Dr. [name] completed on 12/05/2013. 3) Systemic chemotherapy with carboplatin for AUC of 5 and Alimta 500 mg/M2 every 3 weeks. First dose Jan 06 2014. Status post 6 cycles.  CURRENT THERAPY: Systemic chemotherapy with maintenance Alimta 500 MG/M2 every 3 weeks, status post 119 cycles.  INTERVAL HISTORY: Alexis [name] 70 y.o. female returns to the clinic today for follow-up visit.  The patient is feeling fine today with no concerning complaints but she is concerned about her husband's health condition and he is followed by neurology for questionable MS.  She denied having any current chest pain, shortness of breath, cough or hemoptysis.  She denied having any fever or chills.  She has no nausea, vomiting, diarrhea or constipation.  She has no headache or visual changes.  She denied having any weight loss or night sweats.  She is here today for evaluation before starting cycle #120.  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  MEDICATIONS:  Current Outpatient Medications  Medication Sig Dispense Refill   acetaminophen (TYLENOL) 500 MG tablet Take 500 mg by mouth every 6 (six) hours as needed for mild pain or headache. Reported on 11/02/2015     ALPRAZolam (XANAX) 1 MG tablet SMARTSIG:1 Tablet(s) By Mouth 1 to 3 Times Daily PRN     Ascorbic Acid (VITAMIN C GUMMIE PO) Take 1 each by mouth every morning.     aspirin EC 81 MG tablet Take 1 tablet (81 mg total) by mouth daily. 150 tablet 2   [employer] ACID CONTROLLER 10 MG tablet SMARTSIG:1 Tablet(s) By Mouth Every 12 Hours PRN     dexamethasone (DECADRON) 4 MG tablet Take 1 tablet twice a day the day before, the day of, and the day after chemotherapy. 40 tablet 2   folic acid (FOLVITE) 1 MG tablet TAKE 1 TABLET BY MOUTH EVERY DAY 90 tablet 0   HYDROcodone-acetaminophen (NORCO/VICODIN) 5-325 MG tablet TAKE 1 TABLET BY MOUTH 3 TIMES A DAY FOR 5 DAYS AS NEEDED FOR PAIN     loratadine (CLARITIN) 10 MG tablet Take 10 mg by mouth daily.     metFORMIN (GLUCOPHAGE) 500 MG tablet Take 500 mg by mouth 2 (two) times daily.     Multiple Vitamin (MULTIVITAMIN WITH MINERALS) TABS tablet Take 1 tablet by mouth every morning.     nitrofurantoin, macrocrystal-monohydrate, (MACROBID) 100 MG capsule Take 1 capsule (100 mg total) by mouth 2 (two) times daily. 10 capsule 0   omeprazole (PRILOSEC) 20 MG  capsule TAKE 1 CAPSULE BY MOUTH EVERY DAY 90 capsule 2   ondansetron (ZOFRAN) 8 MG tablet TAKE 1 TABLET BY MOUTH BEFORE CHEMO 30 tablet 0   OVER THE COUNTER MEDICATION Take 1 tablet by mouth every morning. (Vitamin A)     prochlorperazine (COMPAZINE) 10 MG tablet Take 1 tablet (10 mg total) by mouth every 6 (six) hours as needed for nausea or vomiting. 60 tablet 0   rosuvastatin (CRESTOR) 10 MG tablet Take 1 tablet (10 mg total) by mouth daily. 30 tablet 12   senna-docusate (SENOKOT-S) 8.6-50 MG tablet Take 1 tablet by mouth daily. 30 tablet prn   valACYclovir (VALTREX) 1000 MG tablet Take  1,000 mg by mouth 3 (three) times daily.     Vitamin D, Ergocalciferol, (DRISDOL) 1.25 MG (50000 UNIT) CAPS capsule Take 50,000 Units by mouth once a week.     No current facility-administered medications for this visit.   Facility-Administered Medications Ordered in Other Visits  Medication Dose Route Frequency Provider Last Rate Last Admin   sodium chloride flush (NS) 0.9 % injection 10 mL  10 mL Intravenous PRN Curt Bears, MD       sodium chloride flush (NS) 0.9 % injection 10 mL  10 mL Intravenous PRN Curt Bears, MD        SURGICAL HISTORY:  Past Surgical History:  Procedure Laterality Date   ABDOMINAL HYSTERECTOMY     COLOSTOMY TAKEDOWN N/A 07/10/2014   Procedure: LAPAROSCOPIC LYSIS OF ADHESIONS (90 MIN) LAPAROSCOPIC ASSISTED COLOSTOMY CLOSURE, RIGID PROCTOSIGMOIDOSCOPY;  Surgeon: Jackolyn Confer, MD;  Location: [facility];  Service: General;  Laterality: N/A;   LAPAROTOMY N/A 11/03/2013   Procedure: EXPLORATORY LAPAROTOMY, DRAINAGE OF INTRA  ABDOMINAL ABSCESSES, MOBILIZATION OF SPLENIC FLEXURE, SIGMOID COLECTOMY WITH COLOSTOMY;  Surgeon: Odis Hollingshead, MD;  Location: [facility];  Service: General;  Laterality: N/A;   VIDEO BRONCHOSCOPY Bilateral 08/30/2013   Procedure: VIDEO BRONCHOSCOPY WITH FLUORO;  Surgeon: Tanda Rockers, MD;  Location: [facility];  Service: Cardiopulmonary;  Laterality: Bilateral;    REVIEW OF SYSTEMS:  A comprehensive review of systems was negative.   PHYSICAL EXAMINATION: General appearance: alert, cooperative, and no distress Head: Normocephalic, without obvious abnormality, atraumatic Neck: no adenopathy, no JVD, supple, symmetrical, trachea midline, and thyroid not enlarged, symmetric, no tenderness/mass/nodules Lymph nodes: Cervical, supraclavicular, and axillary nodes normal. Resp: clear to auscultation bilaterally Back: symmetric, no curvature. ROM normal. No CVA tenderness. Cardio: regular rate and rhythm, S1, S2 normal, no murmur, click, rub  or gallop GI: soft, non-tender; bowel sounds normal; no masses,  no organomegaly Extremities: extremities normal, atraumatic, no cyanosis or edema   ECOG PERFORMANCE STATUS: 0 - Asymptomatic  Blood pressure 113/68, pulse 95, temperature (!) 97.5 F (36.4 C), temperature source Tympanic, resp. rate 16, height _0  (1.626 m), weight 162 lb 11.2 oz (73.8 kg), SpO2 100 %.  LABORATORY DATA: Lab Results  Component Value Date   WBC 4.5 04/20/2021   HGB 11.7 (L) 04/20/2021   HCT 36.5 04/20/2021   MCV 96.1 04/20/2021   PLT 343 04/20/2021      Chemistry      Component Value Date/Time   NA 139 03/30/2021 0832   NA 139 08/14/2017 0837   K 4.4 03/30/2021 0832   K 4.4 08/14/2017 0837   CL 103 03/30/2021 0832   CO2 26 03/30/2021 0832   CO2 24 08/14/2017 0837   BUN 20 03/30/2021 0832   BUN 13.3 08/14/2017 0837   CREATININE 0.84 03/30/2021 0272  CREATININE 0.8 08/14/2017 0837      Component Value Date/Time   CALCIUM 10.1 03/30/2021 0832   CALCIUM 9.3 08/14/2017 0837   ALKPHOS 78 03/30/2021 0832   ALKPHOS 107 08/14/2017 0837   AST 11 (L) 03/30/2021 0832   AST 12 08/14/2017 0837   ALT 11 03/30/2021 0832   ALT 12 08/14/2017 0837   BILITOT 0.4 03/30/2021 0832   BILITOT 0.37 08/14/2017 0837       RADIOGRAPHIC STUDIES: No results found.  ASSESSMENT AND PLAN:  This is a very pleasant 69 years old white female with metastatic non-small cell lung cancer, adenocarcinoma status post induction systemic chemotherapy with carboplatin and Alimta with partial response. The patient is currently on maintenance treatment with single agent Alimta status post 119 cycles. She has been tolerating this treatment well with no concerning adverse effects. I recommended for her to proceed with cycle #120 today as planned. For the hypertension, she will continue with her current blood pressure medication.  It is much better controlled now. For the anxiety she is currently on Xanax. She will come back  for follow-up visit in 3 weeks for evaluation before the next cycle of her treatment. The patient was advised to call immediately if she has any concerning symptoms in the interval. The patient voices understanding of current disease status and treatment options and is in agreement with the current care plan. All questions were answered. The patient knows to call the clinic with any problems, questions or concerns. We can certainly see the patient much sooner if necessary.  Disclaimer: This note was dictated with voice recognition software. Similar sounding words can inadvertently be transcribed and may not be corrected upon review.

## 2021-04-26 DIAGNOSIS — F419 Anxiety disorder, unspecified: Secondary | ICD-10-CM | POA: Diagnosis not present

## 2021-04-30 ENCOUNTER — Telehealth: Payer: Self-pay

## 2021-04-30 DIAGNOSIS — Z20828 Contact with and (suspected) exposure to other viral communicable diseases: Secondary | ICD-10-CM | POA: Diagnosis not present

## 2021-04-30 DIAGNOSIS — D640 Hereditary sideroblastic anemia: Secondary | ICD-10-CM | POA: Diagnosis not present

## 2021-04-30 DIAGNOSIS — R5383 Other fatigue: Secondary | ICD-10-CM | POA: Diagnosis not present

## 2021-04-30 DIAGNOSIS — N3090 Cystitis, unspecified without hematuria: Secondary | ICD-10-CM | POA: Diagnosis not present

## 2021-04-30 DIAGNOSIS — N3001 Acute cystitis with hematuria: Secondary | ICD-10-CM | POA: Diagnosis not present

## 2021-04-30 NOTE — Telephone Encounter (Signed)
Pt called to advise she was been dx with COVID today 04/30/21. Pt was advised she has to remain out of the CC for 21 days. Her 05/11/21 appts have been cancelled. Pt expressed understanding of this information.

## 2021-05-11 ENCOUNTER — Inpatient Hospital Stay: Payer: PPO

## 2021-05-11 ENCOUNTER — Inpatient Hospital Stay: Payer: PPO | Admitting: Physician Assistant

## 2021-05-28 MED FILL — Dexamethasone Sodium Phosphate Inj 100 MG/10ML: INTRAMUSCULAR | Qty: 1 | Status: AC

## 2021-05-30 ENCOUNTER — Other Ambulatory Visit: Payer: Self-pay | Admitting: Physician Assistant

## 2021-05-30 DIAGNOSIS — C3490 Malignant neoplasm of unspecified part of unspecified bronchus or lung: Secondary | ICD-10-CM

## 2021-05-31 ENCOUNTER — Encounter: Payer: Self-pay | Admitting: Internal Medicine

## 2021-05-31 ENCOUNTER — Inpatient Hospital Stay: Payer: PPO

## 2021-05-31 ENCOUNTER — Inpatient Hospital Stay (HOSPITAL_BASED_OUTPATIENT_CLINIC_OR_DEPARTMENT_OTHER): Payer: PPO | Admitting: Internal Medicine

## 2021-05-31 ENCOUNTER — Other Ambulatory Visit: Payer: Self-pay

## 2021-05-31 ENCOUNTER — Inpatient Hospital Stay: Payer: PPO | Attending: Internal Medicine

## 2021-05-31 VITALS — BP 103/64 | HR 108 | Temp 96.5°F | Resp 20 | Ht 64.0 in | Wt 153.1 lb

## 2021-05-31 VITALS — HR 98

## 2021-05-31 DIAGNOSIS — Z923 Personal history of irradiation: Secondary | ICD-10-CM | POA: Diagnosis not present

## 2021-05-31 DIAGNOSIS — N390 Urinary tract infection, site not specified: Secondary | ICD-10-CM | POA: Diagnosis not present

## 2021-05-31 DIAGNOSIS — Z5112 Encounter for antineoplastic immunotherapy: Secondary | ICD-10-CM | POA: Diagnosis not present

## 2021-05-31 DIAGNOSIS — C3411 Malignant neoplasm of upper lobe, right bronchus or lung: Secondary | ICD-10-CM

## 2021-05-31 DIAGNOSIS — E119 Type 2 diabetes mellitus without complications: Secondary | ICD-10-CM | POA: Insufficient documentation

## 2021-05-31 DIAGNOSIS — I10 Essential (primary) hypertension: Secondary | ICD-10-CM | POA: Diagnosis not present

## 2021-05-31 DIAGNOSIS — Z79899 Other long term (current) drug therapy: Secondary | ICD-10-CM | POA: Diagnosis not present

## 2021-05-31 DIAGNOSIS — F419 Anxiety disorder, unspecified: Secondary | ICD-10-CM | POA: Diagnosis not present

## 2021-05-31 DIAGNOSIS — Z7984 Long term (current) use of oral hypoglycemic drugs: Secondary | ICD-10-CM | POA: Insufficient documentation

## 2021-05-31 LAB — CBC WITH DIFFERENTIAL/PLATELET
Abs Immature Granulocytes: 0.03 10*3/uL (ref 0.00–0.07)
Basophils Absolute: 0 10*3/uL (ref 0.0–0.1)
Basophils Relative: 0 %
Eosinophils Absolute: 0 10*3/uL (ref 0.0–0.5)
Eosinophils Relative: 0 %
HCT: 35.5 % — ABNORMAL LOW (ref 36.0–46.0)
Hemoglobin: 11.4 g/dL — ABNORMAL LOW (ref 12.0–15.0)
Immature Granulocytes: 0 %
Lymphocytes Relative: 5 %
Lymphs Abs: 0.5 10*3/uL — ABNORMAL LOW (ref 0.7–4.0)
MCH: 31.5 pg (ref 26.0–34.0)
MCHC: 32.1 g/dL (ref 30.0–36.0)
MCV: 98.1 fL (ref 80.0–100.0)
Monocytes Absolute: 0.9 10*3/uL (ref 0.1–1.0)
Monocytes Relative: 9 %
Neutro Abs: 8.5 10*3/uL — ABNORMAL HIGH (ref 1.7–7.7)
Neutrophils Relative %: 86 %
Platelets: 200 10*3/uL (ref 150–400)
RBC: 3.62 MIL/uL — ABNORMAL LOW (ref 3.87–5.11)
RDW: 16.2 % — ABNORMAL HIGH (ref 11.5–15.5)
WBC: 10 10*3/uL (ref 4.0–10.5)
nRBC: 0 % (ref 0.0–0.2)

## 2021-05-31 LAB — COMPREHENSIVE METABOLIC PANEL
ALT: 7 U/L (ref 0–44)
AST: 12 U/L — ABNORMAL LOW (ref 15–41)
Albumin: 3.3 g/dL — ABNORMAL LOW (ref 3.5–5.0)
Alkaline Phosphatase: 60 U/L (ref 38–126)
Anion gap: 13 (ref 5–15)
BUN: 17 mg/dL (ref 8–23)
CO2: 26 mmol/L (ref 22–32)
Calcium: 9.5 mg/dL (ref 8.9–10.3)
Chloride: 103 mmol/L (ref 98–111)
Creatinine, Ser: 0.86 mg/dL (ref 0.44–1.00)
GFR, Estimated: >60 mL/min (ref >60)
Glucose, Bld: 173 mg/dL — ABNORMAL HIGH (ref 70–99)
Potassium: 3.3 mmol/L — ABNORMAL LOW (ref 3.5–5.1)
Sodium: 142 mmol/L (ref 135–145)
Total Bilirubin: 0.6 mg/dL (ref 0.3–1.2)
Total Protein: 7 g/dL (ref 6.5–8.1)

## 2021-05-31 MED ORDER — SULFAMETHOXAZOLE-TRIMETHOPRIM 800-160 MG PO TABS: 1.0000 | ORAL_TABLET | Freq: Two times a day (BID) | ORAL | 0 refills | Status: DC

## 2021-05-31 MED ORDER — SODIUM CHLORIDE 0.9 % IV SOLN: Freq: Once | INTRAVENOUS | Status: AC

## 2021-05-31 MED ORDER — SODIUM CHLORIDE 0.9 % IV SOLN
500.0000 mg/m2 | Freq: Once | INTRAVENOUS | Status: AC
  Administered 2021-05-31: 900 mg via INTRAVENOUS
  Filled 2021-05-31: qty 20

## 2021-05-31 MED ORDER — SODIUM CHLORIDE 0.9 % IV SOLN
10.0000 mg | Freq: Once | INTRAVENOUS | Status: AC
  Administered 2021-05-31: 10 mg via INTRAVENOUS
  Filled 2021-05-31: qty 10

## 2021-05-31 NOTE — Progress Notes (Signed)
Referral and records faxed to [facility].

## 2021-05-31 NOTE — Patient Instructions (Signed)
[facility]  Discharge Instructions: Thank you for choosing [facility] to provide your oncology and hematology care.   If you have a lab appointment with the [facility], please go directly to the [facility] and check in at the registration area.   Wear comfortable clothing and clothing appropriate for easy access to any Portacath or PICC line.   We strive to give you quality time with your provider. You may need to reschedule your appointment if you arrive late (15 or more minutes).  Arriving late affects you and other patients whose appointments are after yours.  Also, if you miss three or more appointments without notifying the office, you may be dismissed from the clinic at the provider's discretion.      For prescription refill requests, have your pharmacy contact our office and allow 72 hours for refills to be completed.    Today you received the following chemotherapy and/or immunotherapy agent: Pemetrexed (Alimta).     To help prevent nausea and vomiting after your treatment, we encourage you to take your nausea medication as directed.  BELOW ARE SYMPTOMS THAT SHOULD BE REPORTED IMMEDIATELY: *FEVER GREATER THAN 100.4 F (38 C) OR HIGHER *CHILLS OR SWEATING *NAUSEA AND VOMITING THAT IS NOT CONTROLLED WITH YOUR NAUSEA MEDICATION *UNUSUAL SHORTNESS OF BREATH *UNUSUAL BRUISING OR BLEEDING *URINARY PROBLEMS (pain or burning when urinating, or frequent urination) *BOWEL PROBLEMS (unusual diarrhea, constipation, pain near the anus) TENDERNESS IN MOUTH AND THROAT WITH OR WITHOUT PRESENCE OF ULCERS (sore throat, sores in mouth, or a toothache) UNUSUAL RASH, SWELLING OR PAIN  UNUSUAL VAGINAL DISCHARGE OR ITCHING   Items with * indicate a potential emergency and should be followed up as soon as possible or go to the Emergency Department if any problems should occur.  Please show the CHEMOTHERAPY ALERT CARD or IMMUNOTHERAPY ALERT CARD at  check-in to the Emergency Department and triage nurse.  Should you have questions after your visit or need to cancel or reschedule your appointment, please contact [facility]  Dept: [phone]  and follow the prompts.  Office hours are 8:00 a.m. to 4:30 p.m. Monday - Friday. Please note that voicemails left after 4:00 p.m. may not be returned until the following business day.  We are closed weekends and major holidays. You have access to a nurse at all times for urgent questions. Please call the main number to the clinic Dept: [phone] and follow the prompts.   For any non-urgent questions, you may also contact your provider using MyChart. We now offer e-Visits for anyone 38 and older to request care online for non-urgent symptoms. For details visit [email or web address].   Also download the MyChart app! Go to the app store, search "MyChart", open the app, select , and log in with your MyChart username and password.  Due to Covid, a mask is required upon entering the hospital/clinic. If you do not have a mask, one will be given to you upon arrival. For doctor visits, patients may have 1 support person aged 65 or older with them. For treatment visits, patients cannot have anyone with them due to current Covid guidelines and our immunocompromised population.

## 2021-05-31 NOTE — Progress Notes (Signed)
Alexis [name]:(336) [phone]   [phone]  OFFICE PROGRESS NOTE  Alexis [name], [place] [address]  DIAGNOSIS: Stage IV (T2a, N0, M1b) non-small cell lung cancer, adenocarcinoma with negative EGFR and ALK mutations diagnosed in January 2015 and presented with right upper lobe lung mass in addition to a solitary brain metastasis.  PRIOR THERAPY: 1) Status post stereotactic radiotherapy to a solitary right parietal brain lesion under the care of Dr. [name] on 10/16/2013. 2) Status post palliative radiotherapy to the right lung tumor under the care of Dr. [name] completed on 12/05/2013. 3) Systemic chemotherapy with carboplatin for AUC of 5 and Alimta 500 mg/M2 every 3 weeks. First dose Jan 06 2014. Status post 6 cycles.  CURRENT THERAPY: Systemic chemotherapy with maintenance Alimta 500 MG/M2 every 3 weeks, status post 120 cycles.  INTERVAL HISTORY: Alexis [name] 70 y.o. female returns to the clinic today for follow-up visit.  The patient is feeling fine today with no concerning complaints except for fatigue.  She was diagnosed with COVID-19 3 weeks ago.  Her husband was also diagnosed at the same time.  She is also complaining of urinary symptoms and would like referral to Dr. Jeffie Pollock for evaluation of her condition.  She denied having any current chest pain, shortness of breath, cough or hemoptysis.  She has no nausea, vomiting, diarrhea or constipation.  She has no headache or visual changes.  The patient is here today for evaluation before resuming her treatment with chemotherapy.  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  MEDICATIONS:  Current Outpatient Medications  Medication Sig Dispense Refill   acetaminophen (TYLENOL) 500 MG tablet Take 500 mg by mouth every 6 (six) hours as needed for mild pain or headache. Reported on 11/02/2015     ALPRAZolam (XANAX) 1 MG tablet SMARTSIG:1 Tablet(s) By Mouth 1 to 3 Times Daily PRN     Ascorbic Acid (VITAMIN C GUMMIE PO) Take 1 each by mouth every morning.     aspirin EC 81 MG tablet Take 1 tablet (81 mg total) by mouth daily. 150 tablet 2   [employer] ACID CONTROLLER 10 MG tablet SMARTSIG:1 Tablet(s) By Mouth Every 12 Hours PRN     dexamethasone (DECADRON) 4 MG tablet Take 1 tablet twice a day the day before, the day of, and the day after chemotherapy. 40 tablet 2   folic acid (FOLVITE) 1 MG tablet TAKE 1 TABLET BY MOUTH EVERY DAY 90 tablet 0   HYDROcodone-acetaminophen (NORCO/VICODIN) 5-325 MG tablet TAKE 1 TABLET BY MOUTH 3 TIMES A DAY FOR 5 DAYS AS NEEDED FOR PAIN     loratadine (CLARITIN) 10 MG tablet Take 10 mg by mouth daily.     metFORMIN (GLUCOPHAGE) 500 MG tablet Take 500 mg by mouth 2 (two) times daily.     Multiple Vitamin (MULTIVITAMIN WITH MINERALS) TABS tablet Take 1 tablet by mouth every morning.     nitrofurantoin, macrocrystal-monohydrate, (MACROBID) 100 MG capsule Take 1 capsule (100 mg total) by mouth 2 (two) times daily.  10 capsule 0   omeprazole (PRILOSEC) 20 MG capsule TAKE 1 CAPSULE BY MOUTH EVERY DAY 90 capsule 2   ondansetron (ZOFRAN) 8 MG tablet TAKE 1 TABLET BY MOUTH BEFORE CHEMO 30 tablet 0   OVER THE COUNTER MEDICATION Take 1 tablet by mouth every morning. (Vitamin A)     prochlorperazine (COMPAZINE) 10 MG tablet Take 1 tablet (10 mg total) by mouth every 6 (six) hours as needed for nausea or vomiting. 60 tablet 0   rosuvastatin (CRESTOR) 10 MG tablet Take 1 tablet (10 mg total) by mouth daily. 30 tablet 12   senna-docusate (SENOKOT-S) 8.6-50 MG tablet Take 1 tablet by mouth daily. 30 tablet  prn   valACYclovir (VALTREX) 1000 MG tablet Take 1,000 mg by mouth 3 (three) times daily.     Vitamin D, Ergocalciferol, (DRISDOL) 1.25 MG (50000 UNIT) CAPS capsule Take 50,000 Units by mouth once a week.     No current facility-administered medications for this visit.   Facility-Administered Medications Ordered in Other Visits  Medication Dose Route Frequency Provider Last Rate Last Admin   sodium chloride flush (NS) 0.9 % injection 10 mL  10 mL Intravenous PRN Curt Bears, MD       sodium chloride flush (NS) 0.9 % injection 10 mL  10 mL Intravenous PRN Curt Bears, MD        SURGICAL HISTORY:  Past Surgical History:  Procedure Laterality Date   ABDOMINAL HYSTERECTOMY     COLOSTOMY TAKEDOWN N/A 07/10/2014   Procedure: LAPAROSCOPIC LYSIS OF ADHESIONS (90 MIN) LAPAROSCOPIC ASSISTED COLOSTOMY CLOSURE, RIGID PROCTOSIGMOIDOSCOPY;  Surgeon: Jackolyn Confer, MD;  Location: [facility];  Service: General;  Laterality: N/A;   LAPAROTOMY N/A 11/03/2013   Procedure: EXPLORATORY LAPAROTOMY, DRAINAGE OF INTRA  ABDOMINAL ABSCESSES, MOBILIZATION OF SPLENIC FLEXURE, SIGMOID COLECTOMY WITH COLOSTOMY;  Surgeon: Odis Hollingshead, MD;  Location: [facility];  Service: General;  Laterality: N/A;   VIDEO BRONCHOSCOPY Bilateral 08/30/2013   Procedure: VIDEO BRONCHOSCOPY WITH FLUORO;  Surgeon: Tanda Rockers, MD;  Location: [facility];  Service: Cardiopulmonary;  Laterality: Bilateral;    REVIEW OF SYSTEMS:  A comprehensive review of systems was negative except for: Constitutional: positive for fatigue Genitourinary: positive for dysuria   PHYSICAL EXAMINATION: General appearance: alert, cooperative, fatigued, and no distress Head: Normocephalic, without obvious abnormality, atraumatic Neck: no adenopathy, no JVD, supple, symmetrical, trachea midline, and thyroid not enlarged, symmetric, no tenderness/mass/nodules Lymph nodes: Cervical, supraclavicular, and axillary nodes normal. Resp: clear to auscultation  bilaterally Back: symmetric, no curvature. ROM normal. No CVA tenderness. Cardio: regular rate and rhythm, S1, S2 normal, no murmur, click, rub or gallop GI: soft, non-tender; bowel sounds normal; no masses,  no organomegaly Extremities: extremities normal, atraumatic, no cyanosis or edema   ECOG PERFORMANCE STATUS: 1 - Symptomatic but completely ambulatory  Blood pressure 103/64, pulse (!) 108, temperature (!) 96.5 F (35.8 C), temperature source Tympanic, resp. rate 20, height 5' 4"  (1.626 m), weight 153 lb 1.6 oz (69.4 kg), SpO2 98 %.  LABORATORY DATA: Lab Results  Component Value Date   WBC 10.0 05/31/2021   HGB 11.4 (L) 05/31/2021   HCT 35.5 (L) 05/31/2021   MCV 98.1 05/31/2021   PLT 200 05/31/2021      Chemistry      Component Value Date/Time   NA 141 04/20/2021 0753   NA 139 08/14/2017 0837   K 3.5 04/20/2021 0753   K 4.4 08/14/2017 0837   CL 102 04/20/2021 0753   CO2 25 04/20/2021 0753  CO2 24 08/14/2017 0837   BUN 18 04/20/2021 0753   BUN 13.3 08/14/2017 0837   CREATININE 1.02 (H) 04/20/2021 0753   CREATININE 0.84 03/30/2021 0832   CREATININE 0.8 08/14/2017 0837      Component Value Date/Time   CALCIUM 9.2 04/20/2021 0753   CALCIUM 9.3 08/14/2017 0837   ALKPHOS 68 04/20/2021 0753   ALKPHOS 107 08/14/2017 0837   AST 17 04/20/2021 0753   AST 11 (L) 03/30/2021 0832   AST 12 08/14/2017 0837   ALT 11 04/20/2021 0753   ALT 11 03/30/2021 0832   ALT 12 08/14/2017 0837   BILITOT 0.4 04/20/2021 0753   BILITOT 0.4 03/30/2021 0832   BILITOT 0.37 08/14/2017 0837       RADIOGRAPHIC STUDIES: No results found.  ASSESSMENT AND PLAN:  This is a very pleasant 70 years old white female with metastatic non-small cell lung cancer, adenocarcinoma status post induction systemic chemotherapy with carboplatin and Alimta with partial response. The patient is currently on maintenance treatment with single agent Alimta status post 120 cycles. She has been tolerating her  treatment well with no concerning adverse effects.  She missed the last cycle of her treatment because of the COVID-19 infection. I recommended for her to resume her treatment today and she will proceed with cycle #121. For the urinary tract infection, we will check her urinalysis today and start the patient empirically on Bactrim DS twice daily for 3 days.  I will also refer her to Dr. Jeffie Pollock for evaluation based on her request. For the hypertension, she will continue with her current blood pressure medication.  It is much better controlled now. For the anxiety she is currently on Xanax. She will come back for follow-up visit in 3 weeks for evaluation before the next cycle of her treatment. She was advised to call immediately if she has any other concerning symptoms in the interval. The patient voices understanding of current disease status and treatment options and is in agreement with the current care plan. All questions were answered. The patient knows to call the clinic with any problems, questions or concerns. We can certainly see the patient much sooner if necessary.  Disclaimer: This note was dictated with voice recognition software. Similar sounding words can inadvertently be transcribed and may not be corrected upon review.

## 2021-06-18 MED FILL — Dexamethasone Sodium Phosphate Inj 100 MG/10ML: INTRAMUSCULAR | Qty: 1 | Status: AC

## 2021-06-21 ENCOUNTER — Encounter: Payer: Self-pay | Admitting: *Deleted

## 2021-06-21 ENCOUNTER — Encounter: Payer: Self-pay | Admitting: Internal Medicine

## 2021-06-21 ENCOUNTER — Inpatient Hospital Stay: Payer: PPO

## 2021-06-21 ENCOUNTER — Other Ambulatory Visit: Payer: Self-pay

## 2021-06-21 ENCOUNTER — Inpatient Hospital Stay (HOSPITAL_BASED_OUTPATIENT_CLINIC_OR_DEPARTMENT_OTHER): Payer: PPO | Admitting: Internal Medicine

## 2021-06-21 VITALS — BP 154/70 | HR 98 | Temp 97.4°F | Resp 19 | Ht 64.0 in | Wt 154.7 lb

## 2021-06-21 DIAGNOSIS — C3411 Malignant neoplasm of upper lobe, right bronchus or lung: Secondary | ICD-10-CM

## 2021-06-21 DIAGNOSIS — T451X5A Adverse effect of antineoplastic and immunosuppressive drugs, initial encounter: Secondary | ICD-10-CM | POA: Diagnosis not present

## 2021-06-21 DIAGNOSIS — C3490 Malignant neoplasm of unspecified part of unspecified bronchus or lung: Secondary | ICD-10-CM

## 2021-06-21 DIAGNOSIS — Z5111 Encounter for antineoplastic chemotherapy: Secondary | ICD-10-CM

## 2021-06-21 DIAGNOSIS — D6481 Anemia due to antineoplastic chemotherapy: Secondary | ICD-10-CM | POA: Diagnosis not present

## 2021-06-21 DIAGNOSIS — Z5112 Encounter for antineoplastic immunotherapy: Secondary | ICD-10-CM | POA: Diagnosis not present

## 2021-06-21 LAB — CBC WITH DIFFERENTIAL (CANCER CENTER ONLY)
Abs Immature Granulocytes: 0.11 10*3/uL — ABNORMAL HIGH (ref 0.00–0.07)
Basophils Absolute: 0 10*3/uL (ref 0.0–0.1)
Basophils Relative: 0 %
Eosinophils Absolute: 0 10*3/uL (ref 0.0–0.5)
Eosinophils Relative: 0 %
HCT: 31.5 % — ABNORMAL LOW (ref 36.0–46.0)
Hemoglobin: 10.1 g/dL — ABNORMAL LOW (ref 12.0–15.0)
Immature Granulocytes: 2 %
Lymphocytes Relative: 8 %
Lymphs Abs: 0.6 10*3/uL — ABNORMAL LOW (ref 0.7–4.0)
MCH: 31.9 pg (ref 26.0–34.0)
MCHC: 32.1 g/dL (ref 30.0–36.0)
MCV: 99.4 fL (ref 80.0–100.0)
Monocytes Absolute: 0.5 10*3/uL (ref 0.1–1.0)
Monocytes Relative: 8 %
Neutro Abs: 5.7 10*3/uL (ref 1.7–7.7)
Neutrophils Relative %: 82 %
Platelet Count: 559 10*3/uL — ABNORMAL HIGH (ref 150–400)
RBC: 3.17 MIL/uL — ABNORMAL LOW (ref 3.87–5.11)
RDW: 16.8 % — ABNORMAL HIGH (ref 11.5–15.5)
WBC Count: 7 10*3/uL (ref 4.0–10.5)
nRBC: 0 % (ref 0.0–0.2)

## 2021-06-21 LAB — CMP (CANCER CENTER ONLY)
ALT: 12 U/L (ref 0–44)
AST: 19 U/L (ref 15–41)
Albumin: 3.5 g/dL (ref 3.5–5.0)
Alkaline Phosphatase: 57 U/L (ref 38–126)
Anion gap: 9 (ref 5–15)
BUN: 16 mg/dL (ref 8–23)
CO2: 25 mmol/L (ref 22–32)
Calcium: 9.2 mg/dL (ref 8.9–10.3)
Chloride: 105 mmol/L (ref 98–111)
Creatinine: 0.78 mg/dL (ref 0.44–1.00)
GFR, Estimated: >60 mL/min (ref >60)
Glucose, Bld: 155 mg/dL — ABNORMAL HIGH (ref 70–99)
Potassium: 3.8 mmol/L (ref 3.5–5.1)
Sodium: 139 mmol/L (ref 135–145)
Total Bilirubin: 0.3 mg/dL (ref 0.3–1.2)
Total Protein: 6.9 g/dL (ref 6.5–8.1)

## 2021-06-21 MED ORDER — SODIUM CHLORIDE 0.9 % IV SOLN
500.0000 mg/m2 | Freq: Once | INTRAVENOUS | Status: AC
  Administered 2021-06-21: 900 mg via INTRAVENOUS
  Filled 2021-06-21: qty 20

## 2021-06-21 MED ORDER — DEXAMETHASONE SODIUM PHOSPHATE 100 MG/10ML IJ SOLN
10.0000 mg | Freq: Once | INTRAMUSCULAR | Status: AC
  Administered 2021-06-21: 10 mg via INTRAVENOUS
  Filled 2021-06-21: qty 10

## 2021-06-21 MED ORDER — SODIUM CHLORIDE 0.9 % IV SOLN: Freq: Once | INTRAVENOUS | Status: AC

## 2021-06-21 MED ORDER — CYANOCOBALAMIN 1000 MCG/ML IJ SOLN
1000.0000 ug | Freq: Once | INTRAMUSCULAR | Status: AC
  Administered 2021-06-21: 1000 ug via INTRAMUSCULAR
  Filled 2021-06-21: qty 1

## 2021-06-21 NOTE — Progress Notes (Signed)
[facility] [phone]) [phone]   [phone]) [phone]  OFFICE PROGRESS NOTE  Alexis [name], Alexis [name] [address]  DIAGNOSIS: Stage IV (T2a, N0, M1b) non-small cell lung cancer, adenocarcinoma with negative EGFR and ALK mutations diagnosed in January 2015 and presented with right upper lobe lung mass in addition to a solitary brain metastasis.  PRIOR THERAPY: 1) Status post stereotactic radiotherapy to a solitary right parietal brain lesion under the care of Dr. [name] on 10/16/2013. 2) Status post palliative radiotherapy to the right lung tumor under the care of Dr. [name] completed on 12/05/2013. 3) Systemic chemotherapy with carboplatin for AUC of 5 and Alimta 500 mg/M2 every 3 weeks. First dose Jan 06 2014. Status post 6 cycles.  CURRENT THERAPY: Systemic chemotherapy with maintenance Alimta 500 MG/M2 every 3 weeks, status post 121 cycles.  INTERVAL HISTORY: Alexis [name] 70 y.o. female returns to the clinic today for follow-up visit.  The patient is feeling fine today with no concerning complaints.  She denied having any current chest pain, shortness of breath, cough or hemoptysis.  She denied having any fever or chills.  She has no nausea, vomiting, diarrhea or constipation.  She has no headache or visual changes.  She tolerated the last cycle of her treatment fairly well.  The patient is here today for evaluation before starting cycle #122.  MEDICAL HISTORY: Past Medical History:  Diagnosis Date   Anxiety    Anxiety 06/20/2016   Cervical cancer ([place])    Diabetes mellitus without complication ([place])    patient states she has type 2   Encounter for antineoplastic chemotherapy 07/20/2015   Malignant neoplasm of right upper lobe of lung ([facility])     non small cell lung cancer adenocarcioma with brain meta    ALLERGIES:  is allergic to codeine.  MEDICATIONS:  Current Outpatient Medications  Medication Sig Dispense Refill    acetaminophen (TYLENOL) 500 MG tablet Take 500 mg by mouth every 6 (six) hours as needed for mild pain or headache. Reported on 11/02/2015     ALPRAZolam (XANAX) 1 MG tablet SMARTSIG:1 Tablet(s) By Mouth 1 to 3 Times Daily PRN     Ascorbic Acid (VITAMIN C GUMMIE PO) Take 1 each by mouth every morning.     aspirin EC 81 MG tablet Take 1 tablet (81 mg total) by mouth daily. 150 tablet 2   [employer] ACID CONTROLLER 10 MG tablet SMARTSIG:1 Tablet(s) By Mouth Every 12 Hours PRN     dexamethasone (DECADRON) 4 MG tablet Take 1 tablet twice a day the day before, the day of, and the day after chemotherapy. 40 tablet 2   folic acid (FOLVITE) 1 MG tablet TAKE 1 TABLET BY MOUTH EVERY DAY 90 tablet 0   HYDROcodone-acetaminophen (NORCO/VICODIN) 5-325 MG tablet TAKE 1 TABLET BY MOUTH 3 TIMES A DAY FOR 5 DAYS AS NEEDED FOR PAIN     loratadine (CLARITIN) 10 MG tablet Take 10 mg by mouth daily.     metFORMIN (GLUCOPHAGE) 500 MG tablet Take 500 mg by mouth 2 (two) times daily.     Multiple Vitamin (MULTIVITAMIN WITH MINERALS) TABS tablet Take 1 tablet by mouth every morning.     nitrofurantoin, macrocrystal-monohydrate, (MACROBID) 100 MG capsule Take 1 capsule (100 mg total) by mouth 2 (two) times daily. 10 capsule 0   omeprazole (PRILOSEC) 20 MG capsule TAKE 1 CAPSULE BY MOUTH EVERY DAY 90 capsule 2   ondansetron (ZOFRAN) 8  MG tablet TAKE 1 TABLET BY MOUTH BEFORE CHEMO 30 tablet 0   OVER THE COUNTER MEDICATION Take 1 tablet by mouth every morning. (Vitamin A)     prochlorperazine (COMPAZINE) 10 MG tablet Take 1 tablet (10 mg total) by mouth every 6 (six) hours as needed for nausea or vomiting. 60 tablet 0   rosuvastatin (CRESTOR) 10 MG tablet Take 1 tablet (10 mg total) by mouth daily. 30 tablet 12   senna-docusate (SENOKOT-S) 8.6-50 MG tablet Take 1 tablet by mouth daily. 30 tablet prn   sulfamethoxazole-trimethoprim (BACTRIM DS) 800-160 MG tablet Take 1 tablet by mouth 2 (two) times daily. 6 tablet 0   valACYclovir  (VALTREX) 1000 MG tablet Take 1,000 mg by mouth 3 (three) times daily.     Vitamin D, Ergocalciferol, (DRISDOL) 1.25 MG (50000 UNIT) CAPS capsule Take 50,000 Units by mouth once a week.     No current facility-administered medications for this visit.   Facility-Administered Medications Ordered in Other Visits  Medication Dose Route Frequency Provider Last Rate Last Admin   sodium chloride flush (NS) 0.9 % injection 10 mL  10 mL Intravenous PRN Curt Bears, MD       sodium chloride flush (NS) 0.9 % injection 10 mL  10 mL Intravenous PRN Curt Bears, MD        SURGICAL HISTORY:  Past Surgical History:  Procedure Laterality Date   ABDOMINAL HYSTERECTOMY     COLOSTOMY TAKEDOWN N/A 07/10/2014   Procedure: LAPAROSCOPIC LYSIS OF ADHESIONS (90 MIN) LAPAROSCOPIC ASSISTED COLOSTOMY CLOSURE, RIGID PROCTOSIGMOIDOSCOPY;  Surgeon: Jackolyn Confer, MD;  Location: [facility];  Service: General;  Laterality: N/A;   LAPAROTOMY N/A 11/03/2013   Procedure: EXPLORATORY LAPAROTOMY, DRAINAGE OF INTRA  ABDOMINAL ABSCESSES, MOBILIZATION OF SPLENIC FLEXURE, SIGMOID COLECTOMY WITH COLOSTOMY;  Surgeon: Odis Hollingshead, MD;  Location: [facility];  Service: General;  Laterality: N/A;   VIDEO BRONCHOSCOPY Bilateral 08/30/2013   Procedure: VIDEO BRONCHOSCOPY WITH FLUORO;  Surgeon: Tanda Rockers, MD;  Location: [facility];  Service: Cardiopulmonary;  Laterality: Bilateral;    REVIEW OF SYSTEMS:  A comprehensive review of systems was negative.   PHYSICAL EXAMINATION: General appearance: alert, cooperative, and no distress Head: Normocephalic, without obvious abnormality, atraumatic Neck: no adenopathy, no JVD, supple, symmetrical, trachea midline, and thyroid not enlarged, symmetric, no tenderness/mass/nodules Lymph nodes: Cervical, supraclavicular, and axillary nodes normal. Resp: clear to auscultation bilaterally Back: symmetric, no curvature. ROM normal. No CVA tenderness. Cardio: regular rate and rhythm, S1, S2  normal, no murmur, click, rub or gallop GI: soft, non-tender; bowel sounds normal; no masses,  no organomegaly Extremities: extremities normal, atraumatic, no cyanosis or edema   ECOG PERFORMANCE STATUS: 1 - Symptomatic but completely ambulatory  Blood pressure (!) 154/70, pulse 98, temperature (!) 97.4 F (36.3 C), temperature source Tympanic, resp. rate 19, height 5' 4" (1.626 m), weight 154 lb 11.2 oz (70.2 kg), SpO2 99 %.  LABORATORY DATA: Lab Results  Component Value Date   WBC 7.0 06/21/2021   HGB 10.1 (L) 06/21/2021   HCT 31.5 (L) 06/21/2021   MCV 99.4 06/21/2021   PLT 559 (H) 06/21/2021      Chemistry      Component Value Date/Time   NA 142 05/31/2021 1011   NA 139 08/14/2017 0837   K 3.3 (L) 05/31/2021 1011   K 4.4 08/14/2017 0837   CL 103 05/31/2021 1011   CO2 26 05/31/2021 1011   CO2 24 08/14/2017 0837   BUN 17 05/31/2021 1011  BUN 13.3 08/14/2017 0837   CREATININE 0.86 05/31/2021 1011   CREATININE 0.84 03/30/2021 0832   CREATININE 0.8 08/14/2017 0837      Component Value Date/Time   CALCIUM 9.5 05/31/2021 1011   CALCIUM 9.3 08/14/2017 0837   ALKPHOS 60 05/31/2021 1011   ALKPHOS 107 08/14/2017 0837   AST 12 (L) 05/31/2021 1011   AST 11 (L) 03/30/2021 0832   AST 12 08/14/2017 0837   ALT 7 05/31/2021 1011   ALT 11 03/30/2021 0832   ALT 12 08/14/2017 0837   BILITOT 0.6 05/31/2021 1011   BILITOT 0.4 03/30/2021 0832   BILITOT 0.37 08/14/2017 0837       RADIOGRAPHIC STUDIES: No results found.  ASSESSMENT AND PLAN:  This is a very pleasant 70 years old white female with metastatic non-small cell lung cancer, adenocarcinoma status post induction systemic chemotherapy with carboplatin and Alimta with partial response. The patient is currently on maintenance treatment with single agent Alimta status post 121 cycles. She continues to tolerate her maintenance treatment fairly well with no concerning adverse effects. I recommended for the patient to proceed  with cycle #122 today as planned. I will see her back for follow-up visit in 3 weeks for evaluation with repeat CT scan of the chest, abdomen pelvis for restaging of her disease. For the hypertension, she will continue with her current blood pressure medication.  It is much better controlled now. For the anxiety she is currently on Xanax. The patient was advised to call immediately if she has any other concerning symptoms in the interval. The patient voices understanding of current disease status and treatment options and is in agreement with the current care plan. All questions were answered. The patient knows to call the clinic with any problems, questions or concerns. We can certainly see the patient much sooner if necessary.  Disclaimer: This note was dictated with voice recognition software. Similar sounding words can inadvertently be transcribed and may not be corrected upon review.

## 2021-06-21 NOTE — Progress Notes (Signed)
I spoke with Alexis [name] today before her infusion.  I knew she had a lot of family issues going on so I wanted to check to see how she was doing.  I listened as she explained how her husband was going through a lot with covid and his MS.  I offered her support and encouragement.

## 2021-06-29 DIAGNOSIS — N390 Urinary tract infection, site not specified: Secondary | ICD-10-CM | POA: Diagnosis not present

## 2021-06-29 DIAGNOSIS — F419 Anxiety disorder, unspecified: Secondary | ICD-10-CM | POA: Diagnosis not present

## 2021-07-06 NOTE — Progress Notes (Signed)
[facility] OFFICE PROGRESS NOTE  Alexis [name], [place] [address]  DIAGNOSIS: Stage IV (T2a, N0, M1b) non-small cell lung cancer, adenocarcinoma with negative EGFR and ALK mutations diagnosed in January 2015 and presented with right upper lobe lung mass in addition to a solitary brain metastasis  PRIOR THERAPY: 1) Status post stereotactic radiotherapy to a solitary right parietal brain lesion under the care of Dr. [name] on 10/16/2013. 2) Status post palliative radiotherapy to the right lung tumor under the care of Dr. [name] completed on 12/05/2013. 3) Systemic chemotherapy with carboplatin for AUC of 5 and Alimta 500 mg/M2 every 3 weeks. First dose Jan 06 2014. Status post 6 cycles.  CURRENT THERAPY:  Systemic chemotherapy with maintenance Alimta 500 MG/M2 every 3 weeks, status post 122 cycles.  INTERVAL HISTORY: Alexis [name] 70 y.o. female returns to the clinic today for a follow up visit. The patient is feeling good today without any concerning complaints. She gets frequent UTIs and is scheduled to see a urologist in the near future. The patient continues to tolerate treatment with single agent Alimta well without any adverse effects. Denies any fevers or chills. She denies night sweats. Denies any chest pain, cough, or hemoptysis. Denies significant or worsening shortness of breath except mild shortness of breath with exertion. Denies any nausea, vomiting, constipation, or diarrhea. Denies any headache or visual changes.  She had COVID-19 in September. She received 3 vaccines. She is wondering if she can have her flu shot and if she should get a 4th COVID-19 booster. She recently had a restaging CT scan performed.  She is here today for evaluation to review her scan results before starting cycle 123.  MEDICAL HISTORY: Past Medical History:  Diagnosis Date   Anxiety    Anxiety 06/20/2016   Cervical cancer ([facility] [facility])    Diabetes mellitus without  complication ([facility])    patient states she has type 2   Encounter for antineoplastic chemotherapy 07/20/2015   Malignant neoplasm of right upper lobe of lung ([facility])     non small cell lung cancer adenocarcioma with brain meta    ALLERGIES:  is allergic to codeine.  MEDICATIONS:  Current Outpatient Medications  Medication Sig Dispense Refill   acetaminophen (TYLENOL) 500 MG tablet Take 500 mg by mouth every 6 (six) hours as needed for mild pain or headache. Reported on 11/02/2015     ALPRAZolam (XANAX) 1 MG tablet SMARTSIG:1 Tablet(s) By Mouth 1 to 3 Times Daily PRN     Ascorbic Acid (VITAMIN C GUMMIE PO) Take 1 each by mouth every morning.     aspirin EC 81 MG tablet Take 1 tablet (81 mg total) by mouth daily. 150 tablet 2   [employer] ACID CONTROLLER 10 MG tablet SMARTSIG:1 Tablet(s) By Mouth Every 12 Hours PRN     dexamethasone (DECADRON) 4 MG tablet Take 1 tablet twice a day the day before, the day of, and the day after chemotherapy. 40 tablet 2   folic acid (FOLVITE) 1 MG tablet TAKE 1 TABLET BY MOUTH EVERY DAY 90 tablet 0   HYDROcodone-acetaminophen (NORCO/VICODIN) 5-325 MG tablet TAKE 1 TABLET BY MOUTH 3 TIMES A DAY FOR 5 DAYS AS NEEDED FOR PAIN     loratadine (CLARITIN) 10 MG tablet Take 10 mg by mouth daily.     metFORMIN (GLUCOPHAGE) 500 MG tablet Take 500 mg by mouth 2 (two) times daily.     Multiple Vitamin (MULTIVITAMIN WITH MINERALS) TABS  tablet Take 1 tablet by mouth every morning.     nitrofurantoin, macrocrystal-monohydrate, (MACROBID) 100 MG capsule Take 1 capsule (100 mg total) by mouth 2 (two) times daily. 10 capsule 0   omeprazole (PRILOSEC) 20 MG capsule TAKE 1 CAPSULE BY MOUTH EVERY DAY 90 capsule 2   ondansetron (ZOFRAN) 8 MG tablet TAKE 1 TABLET BY MOUTH BEFORE CHEMO 30 tablet 0   OVER THE COUNTER MEDICATION Take 1 tablet by mouth every morning. (Vitamin A)     prochlorperazine (COMPAZINE) 10 MG tablet Take 1 tablet (10 mg total) by mouth every 6 (six) hours as needed for  nausea or vomiting. 60 tablet 0   rosuvastatin (CRESTOR) 10 MG tablet Take 1 tablet (10 mg total) by mouth daily. 30 tablet 12   senna-docusate (SENOKOT-S) 8.6-50 MG tablet Take 1 tablet by mouth daily. 30 tablet prn   sulfamethoxazole-trimethoprim (BACTRIM DS) 800-160 MG tablet Take 1 tablet by mouth 2 (two) times daily. 6 tablet 0   valACYclovir (VALTREX) 1000 MG tablet Take 1,000 mg by mouth 3 (three) times daily.     Vitamin D, Ergocalciferol, (DRISDOL) 1.25 MG (50000 UNIT) CAPS capsule Take 50,000 Units by mouth once a week.     No current facility-administered medications for this visit.   Facility-Administered Medications Ordered in Other Visits  Medication Dose Route Frequency Provider Last Rate Last Admin   sodium chloride flush (NS) 0.9 % injection 10 mL  10 mL Intravenous PRN Curt Bears, MD       sodium chloride flush (NS) 0.9 % injection 10 mL  10 mL Intravenous PRN Curt Bears, MD        SURGICAL HISTORY:  Past Surgical History:  Procedure Laterality Date   ABDOMINAL HYSTERECTOMY     COLOSTOMY TAKEDOWN N/A 07/10/2014   Procedure: LAPAROSCOPIC LYSIS OF ADHESIONS (90 MIN) LAPAROSCOPIC ASSISTED COLOSTOMY CLOSURE, RIGID PROCTOSIGMOIDOSCOPY;  Surgeon: Jackolyn Confer, MD;  Location: [facility];  Service: General;  Laterality: N/A;   LAPAROTOMY N/A 11/03/2013   Procedure: EXPLORATORY LAPAROTOMY, DRAINAGE OF INTRA  ABDOMINAL ABSCESSES, MOBILIZATION OF SPLENIC FLEXURE, SIGMOID COLECTOMY WITH COLOSTOMY;  Surgeon: Odis Hollingshead, MD;  Location: [facility];  Service: General;  Laterality: N/A;   VIDEO BRONCHOSCOPY Bilateral 08/30/2013   Procedure: VIDEO BRONCHOSCOPY WITH FLUORO;  Surgeon: Tanda Rockers, MD;  Location: [facility];  Service: Cardiopulmonary;  Laterality: Bilateral;    REVIEW OF SYSTEMS:   Review of Systems  Constitutional: Negative for appetite change, chills, fatigue, fever and unexpected weight change.  HENT:   Negative for mouth sores, nosebleeds, sore throat  and trouble swallowing.   Eyes: Negative for eye problems and icterus.  Respiratory: Negative for cough, hemoptysis, shortness of breath and wheezing.   Cardiovascular: Negative for chest pain and leg swelling.  Gastrointestinal: Negative for abdominal pain, constipation, diarrhea, nausea and vomiting.  Genitourinary: Negative for bladder incontinence, difficulty urinating, dysuria, frequency and hematuria.   Musculoskeletal: Negative for back pain, gait problem, neck pain and neck stiffness.  Skin: Negative for itching and rash.  Neurological: Negative for dizziness, extremity weakness, gait problem, headaches, light-headedness and seizures.  Hematological: Negative for adenopathy. Does not bruise/bleed easily.  Psychiatric/Behavioral: Negative for confusion, depression and sleep disturbance. The patient is not nervous/anxious.     PHYSICAL EXAMINATION:  Blood pressure 139/76, pulse 98, temperature 97.6 F (36.4 C), temperature source Temporal, resp. rate 16, height _0  (1.626 m), weight 154 lb 3.2 oz (69.9 kg), SpO2 100 %.  ECOG PERFORMANCE STATUS: 1  Physical Exam  Constitutional: Oriented to person, place, and time and well-developed, well-nourished, and in no distress.  HENT:  Head: Normocephalic and atraumatic.  Mouth/Throat: Oropharynx is clear and moist. No oropharyngeal exudate.  Eyes: Conjunctivae are normal. Right eye exhibits no discharge. Left eye exhibits no discharge. No scleral icterus.  Neck: Normal range of motion. Neck supple.  Cardiovascular: Normal rate, regular rhythm, normal heart sounds and intact distal pulses.   Pulmonary/Chest: Effort normal and breath sounds normal. No respiratory distress. No wheezes. No rales.  Abdominal: Soft. Bowel sounds are normal. Exhibits no distension and no mass. There is no tenderness.  Musculoskeletal: Normal range of motion. Exhibits no edema.  Lymphadenopathy:    No cervical adenopathy.  Neurological: Alert and oriented to  person, place, and time. Exhibits normal muscle tone. Gait normal. Coordination normal.  Skin: Skin is warm and dry. No rash noted. Not diaphoretic. No erythema. No pallor.  Psychiatric: Mood, memory and judgment normal.  Vitals reviewed.  LABORATORY DATA: Lab Results  Component Value Date   WBC 10.0 07/12/2021   HGB 10.1 (L) 07/12/2021   HCT 32.3 (L) 07/12/2021   MCV 102.5 (H) 07/12/2021   PLT 322 07/12/2021      Chemistry      Component Value Date/Time   NA 140 07/12/2021 1117   NA 139 08/14/2017 0837   K 3.9 07/12/2021 1117   K 4.4 08/14/2017 0837   CL 107 07/12/2021 1117   CO2 23 07/12/2021 1117   CO2 24 08/14/2017 0837   BUN 16 07/12/2021 1117   BUN 13.3 08/14/2017 0837   CREATININE 0.81 07/12/2021 1117   CREATININE 0.8 08/14/2017 0837      Component Value Date/Time   CALCIUM 9.7 07/12/2021 1117   CALCIUM 9.3 08/14/2017 0837   ALKPHOS 72 07/12/2021 1117   ALKPHOS 107 08/14/2017 0837   AST 11 (L) 07/12/2021 1117   AST 12 08/14/2017 0837   ALT 8 07/12/2021 1117   ALT 12 08/14/2017 0837   BILITOT 0.3 07/12/2021 1117   BILITOT 0.37 08/14/2017 0837       RADIOGRAPHIC STUDIES:  MR Brain W Wo Contrast  Result Date: 07/09/2021 CLINICAL DATA:  Brain/CNS neoplasm, surveillance; metastatic lung cancer. EXAM: MRI HEAD WITHOUT AND WITH CONTRAST TECHNIQUE: Multiplanar, multiecho pulse sequences of the brain and surrounding structures were obtained without and with intravenous contrast. CONTRAST:  30m MULTIHANCE GADOBENATE DIMEGLUMINE 529 MG/ML IV SOLN COMPARISON:  MR head 01/07/2021. FINDINGS: Brain: Right parietal heterogeneously enhancing treated lesion is again identified and is without substantial change in size when allowing for differences in measurement technique. Presently measures 1.6 x 1.3 cm. No substantial change in surrounding T2 hyperintensity. Unchanged associated susceptibility reflecting chronic blood products. No new mass or abnormal enhancement. There is  no acute infarction or intracranial hemorrhage. There is no hydrocephalus or extra-axial fluid collection. Ventricles and sulci are stable in size and configuration. Unchanged additional patchy and confluent areas of T2 hyperintensity in the supratentorial and pontine white matter likely reflecting chronic microvascular ischemic changes. Vascular: Major vessel flow voids at the skull base are preserved. Skull and upper cervical spine: Normal marrow signal is preserved. Sinuses/Orbits: Patchy mucosal thickening. Bilateral lens replacements. Other: Sella is unremarkable.  Patchy mastoid fluid opacification. IMPRESSION: Continued stability of treated right parietal lesion. No evidence of new metastatic disease. Electronically Signed   By: PMacy MisM.D.   On: 07/09/2021 10:03   CT CHEST ABDOMEN PELVIS W CONTRAST  Result Date: 07/09/2021 CLINICAL DATA:  Non-small-cell lung cancer.  Restaging. EXAM: CT CHEST, ABDOMEN, AND PELVIS WITH CONTRAST TECHNIQUE: Multidetector CT imaging of the chest, abdomen and pelvis was performed following the standard protocol during bolus administration of intravenous contrast. CONTRAST:  142m ISOVUE-300 IOPAMIDOL (ISOVUE-300) INJECTION 61% COMPARISON:  02/11/2021 FINDINGS: CT CHEST FINDINGS Cardiovascular: The heart size is normal. No substantial pericardial effusion. Mild atherosclerotic calcification is noted in the wall of the thoracic aorta. Mediastinum/Nodes: No mediastinal lymphadenopathy. There is no hilar lymphadenopathy. The esophagus has normal imaging features. There is no axillary lymphadenopathy. Lungs/Pleura: Centrilobular and paraseptal emphysema evident. Stable post treatment scarring medial right upper lobe extending into the right apex. Stable 6 mm right lower lobe ground-glass opacity on 01/02/6. Tiny ground-glass nodule in the right lower lobe on 89/6 is unchanged. Previously identified right middle lobe subpleural nodule on 117/6 is stable. 2 mm posterior  left upper lobe nodule identified on the previous exam has resolved in the interval. No new suspicious pulmonary nodule or mass. No focal airspace consolidation. No pleural effusion. Musculoskeletal: No worrisome lytic or sclerotic osseous abnormality. CT ABDOMEN PELVIS FINDINGS Hepatobiliary: No suspicious focal abnormality within the liver parenchyma. There is no evidence for gallstones, gallbladder wall thickening, or pericholecystic fluid. No intrahepatic or extrahepatic biliary dilation. Pancreas: No focal mass lesion. No dilatation of the main duct. No intraparenchymal cyst. No peripancreatic edema. Spleen: No splenomegaly. No focal mass lesion. Adrenals/Urinary Tract: No adrenal nodule or mass. Kidneys unremarkable. No evidence for hydroureter. The urinary bladder appears normal for the degree of distention. Stomach/Bowel: Stomach is unremarkable. No gastric wall thickening. No evidence of outlet obstruction. Duodenum is normally positioned as is the ligament of Treitz. No small bowel wall thickening. No small bowel dilatation. The terminal ileum is normal. The appendix is normal. Left colonic anastomosis evident. Vascular/Lymphatic: There is moderate atherosclerotic calcification of the abdominal aorta without aneurysm. There is no gastrohepatic or hepatoduodenal ligament lymphadenopathy. No retroperitoneal or mesenteric lymphadenopathy. No pelvic sidewall lymphadenopathy. Reproductive: Uterus surgically absent.  There is no adnexal mass. Other: No intraperitoneal free fluid. Musculoskeletal: No worrisome lytic or sclerotic osseous abnormality. IMPRESSION: 1. Stable exam. No new or progressive findings to suggest recurrent or metastatic disease in the chest, abdomen, or pelvis. 2. Stable post treatment scarring medial right upper lobe extending into the right apex. 3. Scattered tiny bilateral pulmonary nodules, unchanged. The new 2 mm posterior left upper lobe nodule identified previously has resolved in the  interval. 4. Aortic Atherosclerosis (ICD10-I70.0) and Emphysema (ICD10-J43.9). Electronically Signed   By: EMisty StanleyM.D.   On: 07/09/2021 15:29     ASSESSMENT/PLAN:  This is a very pleasant 70year old Caucasian female with stage IV non-small cell lung cancer, adenocarcinoma who presented with a right upper lobe lung mass in addition to a solitary brain metastatsis.  She was diagnosed in January 2015.  The patient had completed induction systemic chemotherapy with carboplatin and Alimta with a partial response. The patient is currently being treated with single agent maintenance Alimta.  She is status post 122 cycles.   The patient recently had a restaging CT scan performed.  Dr. MJulien Nordmannpersonally and independently reviewed the scan and discussed the results with the patient today.  The scan showed no evidence for disease progression.   Dr. MJulien Nordmannrecommends that the patient proceed with cycle #123 today scheduled.   We will see her back for follow-up visit in 3 weeks for evaluation before starting cycle #124.  She will receive her flu shot while in the infusion room today. Recommend that she  get her 4th COVID-19 vaccine at least 90 days after her COVID-19 infection.   The patient was advised to call immediately if she has any concerning symptoms in the interval. The patient voices understanding of current disease status and treatment options and is in agreement with the current care plan. All questions were answered. The patient knows to call the clinic with any problems, questions or concerns. We can certainly see the patient much sooner if necessary  No orders of the defined types were placed in this encounter.    Lonie Rummell L Jesslyn Viglione, PA-C 07/12/21  ADDENDUM: Hematology/Oncology Attending: I had a face-to-face encounter with the patient today.  I reviewed her record, lab, scan and recommended her care plan.  This is a very pleasant 70 years old white female diagnosed with  a stage IV non-small cell lung cancer, adenocarcinoma presented with right upper lobe lung mass in addition to solitary brain metastasis in January 2015.  The patient completed initial induction systemic chemotherapy with carboplatin and Alimta with partial response.  The patient has been on maintenance treatment with single agent Alimta since that time status post 122 cycles of treatment so far. She has been tolerating this treatment well with no concerning adverse effects. She had repeat CT scan of the chest, abdomen pelvis performed recently.  I personally and independently reviewed the scan and discussed the results with the patient today. Her scan showed no concerning findings for disease progression and there was resolution of the new pulmonary nodule seen on the previous scan that were likely inflammatory in origin after her COVID 19 infection. I recommended for the patient to continue her maintenance treatment with Alimta and she will proceed with cycle #123 today. She will come back for follow-up visit in 3 weeks for evaluation before the next cycle of her treatment. The patient was advised to call immediately if she has any concerning symptoms in the interval. The total time spent in the appointment was 60 minutes.  Disclaimer: This note was dictated with voice recognition software. Similar sounding words can inadvertently be transcribed and may be missed upon review. Eilleen Kempf, MD 07/12/21

## 2021-07-08 ENCOUNTER — Ambulatory Visit
Admission: RE | Admit: 2021-07-08 | Discharge: 2021-07-08 | Disposition: A | Discharge status: Home / Self Care | Payer: PPO | Source: Ambulatory Visit | Attending: Radiation Oncology | Admitting: Radiation Oncology

## 2021-07-08 ENCOUNTER — Other Ambulatory Visit: Payer: Self-pay

## 2021-07-08 DIAGNOSIS — C7931 Secondary malignant neoplasm of brain: Secondary | ICD-10-CM

## 2021-07-08 DIAGNOSIS — C719 Malignant neoplasm of brain, unspecified: Secondary | ICD-10-CM | POA: Diagnosis not present

## 2021-07-08 DIAGNOSIS — C3490 Malignant neoplasm of unspecified part of unspecified bronchus or lung: Secondary | ICD-10-CM | POA: Diagnosis not present

## 2021-07-08 MED ORDER — GADOBENATE DIMEGLUMINE 529 MG/ML IV SOLN
15.0000 mL | Freq: Once | INTRAVENOUS | Status: AC | PRN
  Administered 2021-07-08: 15 mL via INTRAVENOUS

## 2021-07-09 ENCOUNTER — Ambulatory Visit
Admission: RE | Admit: 2021-07-09 | Discharge: 2021-07-09 | Disposition: A | Discharge status: Home / Self Care | Payer: PPO | Source: Ambulatory Visit | Attending: Internal Medicine | Admitting: Internal Medicine

## 2021-07-09 DIAGNOSIS — J432 Centrilobular emphysema: Secondary | ICD-10-CM | POA: Diagnosis not present

## 2021-07-09 DIAGNOSIS — C3490 Malignant neoplasm of unspecified part of unspecified bronchus or lung: Secondary | ICD-10-CM

## 2021-07-09 DIAGNOSIS — K6389 Other specified diseases of intestine: Secondary | ICD-10-CM | POA: Diagnosis not present

## 2021-07-09 DIAGNOSIS — I700 Atherosclerosis of aorta: Secondary | ICD-10-CM | POA: Diagnosis not present

## 2021-07-09 DIAGNOSIS — I2510 Atherosclerotic heart disease of native coronary artery without angina pectoris: Secondary | ICD-10-CM | POA: Diagnosis not present

## 2021-07-09 MED ORDER — IOPAMIDOL (ISOVUE-300) INJECTION 61%
100.0000 mL | Freq: Once | INTRAVENOUS | Status: AC | PRN
  Administered 2021-07-09: 100 mL via INTRAVENOUS

## 2021-07-09 MED FILL — Dexamethasone Sodium Phosphate Inj 100 MG/10ML: INTRAMUSCULAR | Qty: 1 | Status: AC

## 2021-07-12 ENCOUNTER — Inpatient Hospital Stay: Payer: PPO

## 2021-07-12 ENCOUNTER — Other Ambulatory Visit: Payer: Self-pay

## 2021-07-12 ENCOUNTER — Inpatient Hospital Stay: Payer: PPO | Attending: Internal Medicine | Admitting: Physician Assistant

## 2021-07-12 ENCOUNTER — Ambulatory Visit
Admission: RE | Admit: 2021-07-12 | Discharge: 2021-07-12 | Disposition: A | Discharge status: Home / Self Care | Payer: PPO | Source: Ambulatory Visit | Attending: Radiation Oncology | Admitting: Radiation Oncology

## 2021-07-12 ENCOUNTER — Encounter: Payer: Self-pay | Admitting: Radiation Oncology

## 2021-07-12 VITALS — BP 139/76 | HR 98 | Temp 97.6°F | Resp 16 | Ht 64.0 in | Wt 154.2 lb

## 2021-07-12 DIAGNOSIS — Z923 Personal history of irradiation: Secondary | ICD-10-CM | POA: Diagnosis not present

## 2021-07-12 DIAGNOSIS — C3411 Malignant neoplasm of upper lobe, right bronchus or lung: Secondary | ICD-10-CM | POA: Insufficient documentation

## 2021-07-12 DIAGNOSIS — Z79899 Other long term (current) drug therapy: Secondary | ICD-10-CM | POA: Diagnosis not present

## 2021-07-12 DIAGNOSIS — Z23 Encounter for immunization: Secondary | ICD-10-CM | POA: Insufficient documentation

## 2021-07-12 DIAGNOSIS — C7931 Secondary malignant neoplasm of brain: Secondary | ICD-10-CM | POA: Diagnosis not present

## 2021-07-12 DIAGNOSIS — Z5112 Encounter for antineoplastic immunotherapy: Secondary | ICD-10-CM | POA: Diagnosis not present

## 2021-07-12 DIAGNOSIS — Z8541 Personal history of malignant neoplasm of cervix uteri: Secondary | ICD-10-CM | POA: Insufficient documentation

## 2021-07-12 DIAGNOSIS — Z5111 Encounter for antineoplastic chemotherapy: Secondary | ICD-10-CM

## 2021-07-12 LAB — CBC WITH DIFFERENTIAL (CANCER CENTER ONLY)
Abs Immature Granulocytes: 0.08 10*3/uL — ABNORMAL HIGH (ref 0.00–0.07)
Basophils Absolute: 0 10*3/uL (ref 0.0–0.1)
Basophils Relative: 0 %
Eosinophils Absolute: 0 10*3/uL (ref 0.0–0.5)
Eosinophils Relative: 0 %
HCT: 32.3 % — ABNORMAL LOW (ref 36.0–46.0)
Hemoglobin: 10.1 g/dL — ABNORMAL LOW (ref 12.0–15.0)
Immature Granulocytes: 1 %
Lymphocytes Relative: 4 %
Lymphs Abs: 0.4 10*3/uL — ABNORMAL LOW (ref 0.7–4.0)
MCH: 32.1 pg (ref 26.0–34.0)
MCHC: 31.3 g/dL (ref 30.0–36.0)
MCV: 102.5 fL — ABNORMAL HIGH (ref 80.0–100.0)
Monocytes Absolute: 0.4 10*3/uL (ref 0.1–1.0)
Monocytes Relative: 4 %
Neutro Abs: 9.1 10*3/uL — ABNORMAL HIGH (ref 1.7–7.7)
Neutrophils Relative %: 91 %
Platelet Count: 322 10*3/uL (ref 150–400)
RBC: 3.15 MIL/uL — ABNORMAL LOW (ref 3.87–5.11)
RDW: 17.9 % — ABNORMAL HIGH (ref 11.5–15.5)
WBC Count: 10 10*3/uL (ref 4.0–10.5)
nRBC: 0 % (ref 0.0–0.2)

## 2021-07-12 LAB — CMP (CANCER CENTER ONLY)
ALT: 8 U/L (ref 0–44)
AST: 11 U/L — ABNORMAL LOW (ref 15–41)
Albumin: 3.5 g/dL (ref 3.5–5.0)
Alkaline Phosphatase: 72 U/L (ref 38–126)
Anion gap: 10 (ref 5–15)
BUN: 16 mg/dL (ref 8–23)
CO2: 23 mmol/L (ref 22–32)
Calcium: 9.7 mg/dL (ref 8.9–10.3)
Chloride: 107 mmol/L (ref 98–111)
Creatinine: 0.81 mg/dL (ref 0.44–1.00)
GFR, Estimated: >60 mL/min (ref >60)
Glucose, Bld: 128 mg/dL — ABNORMAL HIGH (ref 70–99)
Potassium: 3.9 mmol/L (ref 3.5–5.1)
Sodium: 140 mmol/L (ref 135–145)
Total Bilirubin: 0.3 mg/dL (ref 0.3–1.2)
Total Protein: 6.8 g/dL (ref 6.5–8.1)

## 2021-07-12 MED ORDER — SODIUM CHLORIDE 0.9 % IV SOLN
500.0000 mg/m2 | Freq: Once | INTRAVENOUS | Status: AC
  Administered 2021-07-12: 900 mg via INTRAVENOUS
  Filled 2021-07-12: qty 20

## 2021-07-12 MED ORDER — SODIUM CHLORIDE 0.9 % IV SOLN
10.0000 mg | Freq: Once | INTRAVENOUS | Status: AC
  Administered 2021-07-12: 10 mg via INTRAVENOUS
  Filled 2021-07-12: qty 10

## 2021-07-12 MED ORDER — INFLUENZA VAC A&B SA ADJ QUAD 0.5 ML IM PRSY
0.5000 mL | PREFILLED_SYRINGE | Freq: Once | INTRAMUSCULAR | Status: AC
  Administered 2021-07-12: 0.5 mL via INTRAMUSCULAR
  Filled 2021-07-12: qty 0.5

## 2021-07-12 MED ORDER — SODIUM CHLORIDE 0.9 % IV SOLN: Freq: Once | INTRAVENOUS | Status: AC

## 2021-07-12 MED ORDER — CYANOCOBALAMIN 1000 MCG/ML IJ SOLN: 1000.0000 ug | Freq: Once | INTRAMUSCULAR | Status: DC

## 2021-07-12 NOTE — Progress Notes (Signed)
Radiation Oncology         [phone]) [phone]  Outpatient Follow Up - Conducted via telephone due to current COVID-19 concerns for limiting patient exposure  Our staff spoke with the patient to conduct this consult visit via telephone to spare the patient unnecessary potential exposure in the healthcare setting during the current COVID-19 pandemic. The patient was notified in advance and was offered a [place] meeting to allow for face to face communication but unfortunately reported that they did not have the appropriate resources/technology to support such a visit and instead preferred to proceed with a telephone discussion.  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  Interval Since Last Radiation: 7 years, 9 months   10/28/2013 through 12/05/2013:   The patient was treated to the right lung tumor to a dose of 50 gray in 25 fractions using a 3-D conformal technique. Daily image guidance was used for the patient's treatment.  10/16/2013 SRS Treatment: PTV1: Rt Parietal 45mm target was treated using 3 Arcs to a prescription dose of 20 Gy. ExacTrac Snap verification was performed for each couch angle.  Narrative:    In summary this is a 70 y.o. patient with a history of metastatic lung cancer to the brain who was treated in two sessions in 2015 for her brain disease, followed by local control to the right lung.  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 She is no longer taking these medications.  She remains on maintenance Alimta with Dr. Julien Nordmann every 3 weeks and has had a total of 123 cycles to date. She had an MRI on 07/08/21 that did not show any evidence of new or active  disease in the brain. She's contacted today to discuss this result but I had to leave a voicemail.    Past Medical History:  Past Medical History:  Diagnosis Date   Anxiety    Anxiety 06/20/2016   Cervical cancer ([place])    Diabetes mellitus without complication ([place])    patient states she has type 2   Encounter for antineoplastic chemotherapy 07/20/2015   Malignant neoplasm of right upper lobe of lung ([place])     non small cell lung cancer adenocarcioma with brain meta    Past Surgical History: Past Surgical History:  Procedure Laterality Date   ABDOMINAL HYSTERECTOMY     COLOSTOMY TAKEDOWN N/A 07/10/2014   Procedure: LAPAROSCOPIC LYSIS OF ADHESIONS (90 MIN) LAPAROSCOPIC ASSISTED COLOSTOMY CLOSURE, RIGID PROCTOSIGMOIDOSCOPY;  Surgeon: Jackolyn Confer, MD;  Location: [facility];  Service: General;  Laterality: N/A;   LAPAROTOMY N/A 11/03/2013   Procedure: EXPLORATORY LAPAROTOMY, DRAINAGE OF INTRA  ABDOMINAL ABSCESSES, MOBILIZATION OF SPLENIC FLEXURE, SIGMOID COLECTOMY WITH COLOSTOMY;  Surgeon: Odis Hollingshead, MD;  Location: [facility];  Service: General;  Laterality: N/A;   VIDEO BRONCHOSCOPY Bilateral 08/30/2013   Procedure: VIDEO BRONCHOSCOPY WITH FLUORO;  Surgeon: Tanda Rockers, MD;  Location: [facility];  Service: Cardiopulmonary;  Laterality: Bilateral;    Social History:  Social History   Socioeconomic History   Marital status: Married    Spouse name: Not on file   Number of children: Not on file   Years of education: Not on file   Highest education level:  Not on file  Occupational History   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  Tobacco Use   Smoking status: Former    Packs/day: 1.00    Years: 40.00    Pack years: 40.00    Types: Cigarettes    Quit date: 09/27/2013    Years since quitting: 7.7   Smokeless tobacco: Never  Vaping Use   Vaping Use: Never used  Substance and Sexual Activity   Alcohol use: No   Drug use: No   Sexual activity: Yes  Other  Topics Concern   Not on file  Social History Narrative   Not on file   Social Determinants of Health   Financial Resource Strain: Not on file  Food Insecurity: Not on file  Transportation Needs: Not on file  Physical Activity: Not on file  Stress: Not on file  Social Connections: Not on file  Intimate Partner Violence: Not on file  The patient is married. She lives in [place], [place].  Family History: Family History  Problem Relation Age of Onset   Emphysema Father        smoked   Lung cancer Father        smoked   Cancer Mother    Hypertension Mother    COPD Mother     ALLERGIES:  is allergic to codeine.  Meds: Current Outpatient Medications  Medication Sig Dispense Refill   acetaminophen (TYLENOL) 500 MG tablet Take 500 mg by mouth every 6 (six) hours as needed for mild pain or headache. Reported on 11/02/2015     ALPRAZolam (XANAX) 1 MG tablet SMARTSIG:1 Tablet(s) By Mouth 1 to 3 Times Daily PRN     Ascorbic Acid (VITAMIN C GUMMIE PO) Take 1 each by mouth every morning.     aspirin EC 81 MG tablet Take 1 tablet (81 mg total) by mouth daily. 150 tablet 2   [employer] ACID CONTROLLER 10 MG tablet SMARTSIG:1 Tablet(s) By Mouth Every 12 Hours PRN     dexamethasone (DECADRON) 4 MG tablet Take 1 tablet twice a day the day before, the day of, and the day after chemotherapy. 40 tablet 2   folic acid (FOLVITE) 1 MG tablet TAKE 1 TABLET BY MOUTH EVERY DAY 90 tablet 0   HYDROcodone-acetaminophen (NORCO/VICODIN) 5-325 MG tablet TAKE 1 TABLET BY MOUTH 3 TIMES A DAY FOR 5 DAYS AS NEEDED FOR PAIN     loratadine (CLARITIN) 10 MG tablet Take 10 mg by mouth daily.     metFORMIN (GLUCOPHAGE) 500 MG tablet Take 500 mg by mouth 2 (two) times daily.     Multiple Vitamin (MULTIVITAMIN WITH MINERALS) TABS tablet Take 1 tablet by mouth every morning.     nitrofurantoin, macrocrystal-monohydrate, (MACROBID) 100 MG capsule Take 1 capsule (100 mg total) by mouth 2 (two) times daily. 10 capsule 0    omeprazole (PRILOSEC) 20 MG capsule TAKE 1 CAPSULE BY MOUTH EVERY DAY 90 capsule 2   ondansetron (ZOFRAN) 8 MG tablet TAKE 1 TABLET BY MOUTH BEFORE CHEMO 30 tablet 0   OVER THE COUNTER MEDICATION Take 1 tablet by mouth every morning. (Vitamin A)     prochlorperazine (COMPAZINE) 10 MG tablet Take 1 tablet (10 mg total) by mouth every 6 (six) hours as needed for nausea or vomiting. 60 tablet 0   rosuvastatin (CRESTOR) 10 MG tablet Take 1 tablet (10 mg total) by mouth daily. 30 tablet 12   senna-docusate (SENOKOT-S) 8.6-50 MG tablet Take 1 tablet by mouth daily. 30 tablet prn  sulfamethoxazole-trimethoprim (BACTRIM DS) 800-160 MG tablet Take 1 tablet by mouth 2 (two) times daily. 6 tablet 0   valACYclovir (VALTREX) 1000 MG tablet Take 1,000 mg by mouth 3 (three) times daily.     Vitamin D, Ergocalciferol, (DRISDOL) 1.25 MG (50000 UNIT) CAPS capsule Take 50,000 Units by mouth once a week.     No current facility-administered medications for this encounter.   Facility-Administered Medications Ordered in Other Encounters  Medication Dose Route Frequency Provider Last Rate Last Admin   sodium chloride flush (NS) 0.9 % injection 10 mL  10 mL Intravenous PRN Curt Bears, MD       sodium chloride flush (NS) 0.9 % injection 10 mL  10 mL Intravenous PRN Curt Bears, MD        Physical Findings: Unable to assess due to type of encounter  Lab Findings: Lab Results  Component Value Date   WBC 10.0 07/12/2021   HGB 10.1 (L) 07/12/2021   HCT 32.3 (L) 07/12/2021   MCV 102.5 (H) 07/12/2021   PLT 322 07/12/2021     Radiographic Findings: MR Brain W Wo Contrast  Result Date: 07/09/2021 CLINICAL DATA:  Brain/CNS neoplasm, surveillance; metastatic lung cancer. EXAM: MRI HEAD WITHOUT AND WITH CONTRAST TECHNIQUE: Multiplanar, multiecho pulse sequences of the brain and surrounding structures were obtained without and with intravenous contrast. CONTRAST:  8mL MULTIHANCE GADOBENATE DIMEGLUMINE  529 MG/ML IV SOLN COMPARISON:  MR head 01/07/2021. FINDINGS: Brain: Right parietal heterogeneously enhancing treated lesion is again identified and is without substantial change in size when allowing for differences in measurement technique. Presently measures 1.6 x 1.3 cm. No substantial change in surrounding T2 hyperintensity. Unchanged associated susceptibility reflecting chronic blood products. No new mass or abnormal enhancement. There is no acute infarction or intracranial hemorrhage. There is no hydrocephalus or extra-axial fluid collection. Ventricles and sulci are stable in size and configuration. Unchanged additional patchy and confluent areas of T2 hyperintensity in the supratentorial and pontine white matter likely reflecting chronic microvascular ischemic changes. Vascular: Major vessel flow voids at the skull base are preserved. Skull and upper cervical spine: Normal marrow signal is preserved. Sinuses/Orbits: Patchy mucosal thickening. Bilateral lens replacements. Other: Sella is unremarkable.  Patchy mastoid fluid opacification. IMPRESSION: Continued stability of treated right parietal lesion. No evidence of new metastatic disease. Electronically Signed   By: Macy Mis M.D.   On: 07/09/2021 10:03   CT CHEST ABDOMEN PELVIS W CONTRAST  Result Date: 07/09/2021 CLINICAL DATA:  Non-small-cell lung cancer.  Restaging. EXAM: CT CHEST, ABDOMEN, AND PELVIS WITH CONTRAST TECHNIQUE: Multidetector CT imaging of the chest, abdomen and pelvis was performed following the standard protocol during bolus administration of intravenous contrast. CONTRAST:  145mL ISOVUE-300 IOPAMIDOL (ISOVUE-300) INJECTION 61% COMPARISON:  02/11/2021 FINDINGS: CT CHEST FINDINGS Cardiovascular: The heart size is normal. No substantial pericardial effusion. Mild atherosclerotic calcification is noted in the wall of the thoracic aorta. Mediastinum/Nodes: No mediastinal lymphadenopathy. There is no hilar lymphadenopathy. The  esophagus has normal imaging features. There is no axillary lymphadenopathy. Lungs/Pleura: Centrilobular and paraseptal emphysema evident. Stable post treatment scarring medial right upper lobe extending into the right apex. Stable 6 mm right lower lobe ground-glass opacity on 01/02/6. Tiny ground-glass nodule in the right lower lobe on 89/6 is unchanged. Previously identified right middle lobe subpleural nodule on 117/6 is stable. 2 mm posterior left upper lobe nodule identified on the previous exam has resolved in the interval. No new suspicious pulmonary nodule or mass. No focal airspace consolidation. No pleural effusion.  Musculoskeletal: No worrisome lytic or sclerotic osseous abnormality. CT ABDOMEN PELVIS FINDINGS Hepatobiliary: No suspicious focal abnormality within the liver parenchyma. There is no evidence for gallstones, gallbladder wall thickening, or pericholecystic fluid. No intrahepatic or extrahepatic biliary dilation. Pancreas: No focal mass lesion. No dilatation of the main duct. No intraparenchymal cyst. No peripancreatic edema. Spleen: No splenomegaly. No focal mass lesion. Adrenals/Urinary Tract: No adrenal nodule or mass. Kidneys unremarkable. No evidence for hydroureter. The urinary bladder appears normal for the degree of distention. Stomach/Bowel: Stomach is unremarkable. No gastric wall thickening. No evidence of outlet obstruction. Duodenum is normally positioned as is the ligament of Treitz. No small bowel wall thickening. No small bowel dilatation. The terminal ileum is normal. The appendix is normal. Left colonic anastomosis evident. Vascular/Lymphatic: There is moderate atherosclerotic calcification of the abdominal aorta without aneurysm. There is no gastrohepatic or hepatoduodenal ligament lymphadenopathy. No retroperitoneal or mesenteric lymphadenopathy. No pelvic sidewall lymphadenopathy. Reproductive: Uterus surgically absent.  There is no adnexal mass. Other: No intraperitoneal  free fluid. Musculoskeletal: No worrisome lytic or sclerotic osseous abnormality. IMPRESSION: 1. Stable exam. No new or progressive findings to suggest recurrent or metastatic disease in the chest, abdomen, or pelvis. 2. Stable post treatment scarring medial right upper lobe extending into the right apex. 3. Scattered tiny bilateral pulmonary nodules, unchanged. The new 2 mm posterior left upper lobe nodule identified previously has resolved in the interval. 4. Aortic Atherosclerosis (ICD10-I70.0) and Emphysema (ICD10-J43.9). Electronically Signed   By: Misty Stanley M.D.   On: 07/09/2021 15:29     Impression/Plan: 1. 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 Mrs. Redler continues to do very well and our team is so glad she remains without disease in the brain, and continues to do very well in tolerating her ongoing Alimta chemotherapy.  She remains stable systemically as well. I left a voicemail for her to call back at her convenience. We continue to follow her per NCCN guidelines for repeat MRI imaging q 6 months indefinitely.  We will discuss her results when she calls back again.        Carola Rhine, PAC

## 2021-07-12 NOTE — Patient Instructions (Signed)
[facility]  Discharge Instructions: Thank you for choosing [facility] to provide your oncology and hematology care.   If you have a lab appointment with the [facility], please go directly to the [facility] and check in at the registration area.   Wear comfortable clothing and clothing appropriate for easy access to any Portacath or PICC line.   We strive to give you quality time with your provider. You may need to reschedule your appointment if you arrive late (15 or more minutes).  Arriving late affects you and other patients whose appointments are after yours.  Also, if you miss three or more appointments without notifying the office, you may be dismissed from the clinic at the provider's discretion.      For prescription refill requests, have your pharmacy contact our office and allow 72 hours for refills to be completed.    Today you received the following chemotherapy and/or immunotherapy agents: Alimta    To help prevent nausea and vomiting after your treatment, we encourage you to take your nausea medication as directed.  BELOW ARE SYMPTOMS THAT SHOULD BE REPORTED IMMEDIATELY: *FEVER GREATER THAN 100.4 F (38 C) OR HIGHER *CHILLS OR SWEATING *NAUSEA AND VOMITING THAT IS NOT CONTROLLED WITH YOUR NAUSEA MEDICATION *UNUSUAL SHORTNESS OF BREATH *UNUSUAL BRUISING OR BLEEDING *URINARY PROBLEMS (pain or burning when urinating, or frequent urination) *BOWEL PROBLEMS (unusual diarrhea, constipation, pain near the anus) TENDERNESS IN MOUTH AND THROAT WITH OR WITHOUT PRESENCE OF ULCERS (sore throat, sores in mouth, or a toothache) UNUSUAL RASH, SWELLING OR PAIN  UNUSUAL VAGINAL DISCHARGE OR ITCHING   Items with * indicate a potential emergency and should be followed up as soon as possible or go to the Emergency Department if any problems should occur.  Please show the CHEMOTHERAPY ALERT CARD or IMMUNOTHERAPY ALERT CARD at check-in to the  Emergency Department and triage nurse.  Should you have questions after your visit or need to cancel or reschedule your appointment, please contact [facility]  Dept: [phone]  and follow the prompts.  Office hours are 8:00 a.m. to 4:30 p.m. Monday - Friday. Please note that voicemails left after 4:00 p.m. may not be returned until the following business day.  We are closed weekends and major holidays. You have access to a nurse at all times for urgent questions. Please call the main number to the clinic Dept: [phone] and follow the prompts.   For any non-urgent questions, you may also contact your provider using MyChart. We now offer e-Visits for anyone 70 and older to request care online for non-urgent symptoms. For details visit [email or web address].   Also download the MyChart app! Go to the app store, search "MyChart", open the app, select [facility], and log in with your MyChart username and password.  Due to Covid, a mask is required upon entering the hospital/clinic. If you do not have a mask, one will be given to you upon arrival. For doctor visits, patients may have 1 support person aged 54 or older with them. For treatment visits, patients cannot have anyone with them due to current Covid guidelines and our immunocompromised population.   Influenza Virus Vaccine injection What is this medication? INFLUENZA VIRUS VACCINE (in floo EN zuh VAHY ruhs vak SEEN) helps to reduce the risk of getting influenza also known as the flu. The vaccine only helps protect you against some strains of the flu. This medicine may be used for other  purposes; ask your health care provider or pharmacist if you have questions. COMMON BRAND NAME(S): Afluria, Afluria Quadrivalent, Agriflu, Alfuria, FLUAD, FLUAD Quadrivalent, Fluarix, Fluarix Quadrivalent, Flublok, Flublok Quadrivalent, FLUCELVAX, FLUCELVAX Quadrivalent, Flulaval, Flulaval Quadrivalent, Fluvirin, Fluzone,  Fluzone High-Dose, Fluzone Intradermal, Fluzone Quadrivalent What should I tell my care team before I take this medication? They need to know if you have any of these conditions: bleeding disorder like hemophilia fever or infection Guillain-Barre syndrome or other neurological problems immune system problems infection with the human immunodeficiency virus (HIV) or AIDS low blood platelet counts multiple sclerosis an unusual or allergic reaction to influenza virus vaccine, latex, other medicines, foods, dyes, or preservatives. Different brands of vaccines contain different allergens. Some may contain latex or eggs. Talk to your doctor about your allergies to make sure that you get the right vaccine. pregnant or trying to get pregnant breast-feeding How should I use this medication? This vaccine is for injection into a muscle or under the skin. It is given by a health care professional. A copy of Vaccine Information Statements will be given before each vaccination. Read this sheet carefully each time. The sheet may change frequently. Talk to your healthcare provider to see which vaccines are right for you. Some vaccines should not be used in all age groups. Overdosage: If you think you have taken too much of this medicine contact a poison control center or emergency room at once. NOTE: This medicine is only for you. Do not share this medicine with others. What if I miss a dose? This does not apply. What may interact with this medication? chemotherapy or radiation therapy medicines that lower your immune system like etanercept, anakinra, infliximab, and adalimumab medicines that treat or prevent blood clots like warfarin phenytoin steroid medicines like prednisone or cortisone theophylline vaccines This list may not describe all possible interactions. Give your health care provider a list of all the medicines, herbs, non-prescription drugs, or dietary supplements you use. Also tell them if  you smoke, drink alcohol, or use illegal drugs. Some items may interact with your medicine. What should I watch for while using this medication? Report any side effects that do not go away within 3 days to your doctor or health care professional. Call your health care provider if any unusual symptoms occur within 6 weeks of receiving this vaccine. You may still catch the flu, but the illness is not usually as bad. You cannot get the flu from the vaccine. The vaccine will not protect against colds or other illnesses that may cause fever. The vaccine is needed every year. What side effects may I notice from receiving this medication? Side effects that you should report to your doctor or health care professional as soon as possible: allergic reactions like skin rash, itching or hives, swelling of the face, lips, or tongue Side effects that usually do not require medical attention (report to your doctor or health care professional if they continue or are bothersome): fever headache muscle aches and pains pain, tenderness, redness, or swelling at the injection site tiredness This list may not describe all possible side effects. Call your doctor for medical advice about side effects. You may report side effects to FDA at 1-800-FDA-1088. Where should I keep my medication? The vaccine will be given by a health care professional in a clinic, pharmacy, doctor's office, or other health care setting. You will not be given vaccine doses to store at home. NOTE: This sheet is a summary. It may not cover all possible  information. If you have questions about this medicine, talk to your doctor, pharmacist, or health care provider.  2022 Elsevier/Gold Standard (2021-05-04 00:00:00)

## 2021-07-12 NOTE — Progress Notes (Signed)
Patient states doing well. No symptoms reported at this time.  Meaningful use complete.  Patient notified of 3:00pm 07/12/21 telephone appointment and verbalized understanding.

## 2021-07-14 ENCOUNTER — Telehealth: Payer: Self-pay | Admitting: Radiation Oncology

## 2021-07-14 NOTE — Telephone Encounter (Signed)
I called to follow up and confirmed that she heard the good news of her recent scans. We will keep our plans for another MRI brain in 6 months time.

## 2021-07-19 DIAGNOSIS — R300 Dysuria: Secondary | ICD-10-CM | POA: Diagnosis not present

## 2021-07-19 DIAGNOSIS — N952 Postmenopausal atrophic vaginitis: Secondary | ICD-10-CM | POA: Diagnosis not present

## 2021-07-19 DIAGNOSIS — N3020 Other chronic cystitis without hematuria: Secondary | ICD-10-CM | POA: Diagnosis not present

## 2021-07-26 ENCOUNTER — Other Ambulatory Visit: Payer: Self-pay

## 2021-07-26 ENCOUNTER — Encounter: Payer: Self-pay | Admitting: Nurse Practitioner

## 2021-07-26 ENCOUNTER — Ambulatory Visit (INDEPENDENT_AMBULATORY_CARE_PROVIDER_SITE_OTHER): Payer: PPO | Admitting: Nurse Practitioner

## 2021-07-26 VITALS — BP 93/66 | HR 67 | Temp 98.1°F | Ht 64.0 in | Wt 153.8 lb

## 2021-07-26 DIAGNOSIS — Z7689 Persons encountering health services in other specified circumstances: Secondary | ICD-10-CM | POA: Diagnosis not present

## 2021-07-26 DIAGNOSIS — Z6826 Body mass index (BMI) 26.0-26.9, adult: Secondary | ICD-10-CM

## 2021-07-26 DIAGNOSIS — R21 Rash and other nonspecific skin eruption: Secondary | ICD-10-CM

## 2021-07-26 DIAGNOSIS — F411 Generalized anxiety disorder: Secondary | ICD-10-CM | POA: Diagnosis not present

## 2021-07-26 DIAGNOSIS — B029 Zoster without complications: Secondary | ICD-10-CM | POA: Diagnosis not present

## 2021-07-26 MED ORDER — VALACYCLOVIR HCL 1 G PO TABS: 1000.0000 mg | ORAL_TABLET | Freq: Two times a day (BID) | ORAL | 0 refills | Status: DC

## 2021-07-26 MED ORDER — ALPRAZOLAM 1 MG PO TABS: 1.0000 mg | ORAL_TABLET | Freq: Two times a day (BID) | ORAL | 1 refills | Status: DC | PRN

## 2021-07-26 MED ORDER — TRIAMCINOLONE ACETONIDE 0.5 % EX OINT: 1.0000 "application " | TOPICAL_OINTMENT | Freq: Two times a day (BID) | CUTANEOUS | 1 refills | Status: DC

## 2021-07-26 NOTE — Progress Notes (Signed)
New Patient Office Visit  Subjective:  Patient ID: Alexis [name], female    DOB: 29-Sep-1950  Age: 70 y.o. MRN: [id number]  CC:  Chief Complaint  Patient presents with   New Patient (Initial Visit)    HPI Alexis [name] presents to establish new primary care provider. She state that she has patchy, burning rash on the inner right thigh. Has been present for a few days and gradually getting worse.  She states she has had shingles in the past and this rash is very similar.  She does have intermittent and moderate anxiety.  Anxiety situational in nature.  She has been previously prescribed alprazolam 1 mg tablets.  States that some days she will bite this past intake only 1/2 tablet.  On some occasions she takes the entire tablet once daily.  Prescription is currently written for 3 times daily.I have reviewed her PDMP history.  I have reviewed her PDMP profile.  Her overdose risk is 060.  Last fill of alprazolam was evaluated in November for 60 tablets.  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    Past Surgical History:  Procedure Laterality Date   ABDOMINAL HYSTERECTOMY     COLOSTOMY TAKEDOWN N/A 07/10/2014   Procedure: LAPAROSCOPIC LYSIS OF ADHESIONS (90 MIN) LAPAROSCOPIC ASSISTED COLOSTOMY CLOSURE, RIGID PROCTOSIGMOIDOSCOPY;  Surgeon: Jackolyn Confer, MD;  Location: [facility];  Service: General;  Laterality: N/A;   LAPAROTOMY N/A 11/03/2013   Procedure: EXPLORATORY LAPAROTOMY, DRAINAGE OF INTRA  ABDOMINAL ABSCESSES, MOBILIZATION OF SPLENIC FLEXURE, SIGMOID COLECTOMY WITH COLOSTOMY;  Surgeon: Odis Hollingshead, MD;  Location: [facility];  Service: General;  Laterality: N/A;   VIDEO  BRONCHOSCOPY Bilateral 08/30/2013   Procedure: VIDEO BRONCHOSCOPY WITH FLUORO;  Surgeon: Tanda Rockers, MD;  Location: [facility];  Service: Cardiopulmonary;  Laterality: Bilateral;    Family History  Problem Relation Age of Onset   Emphysema Father        smoked   Lung cancer Father        smoked   Cancer Mother    Hypertension Mother    COPD Mother     Social History   Socioeconomic History   Marital status: Married    Spouse name: Not on file   Number of children: Not on file   Years of education: Not on file   Highest education level: Not on file  Occupational History   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  Tobacco Use   Smoking status: Former    Packs/day: 1.00    Years: 40.00    Pack years: 40.00    Types: Cigarettes    Quit date: 09/27/2013    Years since quitting: 7.8   Smokeless tobacco: Never  Vaping Use   Vaping Use: Never used  Substance and Sexual Activity   Alcohol use: No   Drug use: No   Sexual activity: Yes  Other Topics Concern   Not on file  Social History Narrative   Not on file   Social Determinants of Health   Financial Resource Strain: Not on file  Food Insecurity: Not on file  Transportation Needs: Not on file  Physical Activity: Not on file  Stress: Not on file  Social Connections: Not on file  Intimate Partner Violence: Not on file    ROS Review of Systems  Constitutional:  Negative for activity change, appetite change, chills, fatigue and fever.  HENT:  Negative for congestion, postnasal drip, rhinorrhea, sinus pressure, sinus pain, sneezing and sore throat.   Eyes: Negative.   Respiratory:  Negative for cough, chest tightness, shortness of breath and wheezing.   Cardiovascular:  Negative for chest pain and palpitations.  Gastrointestinal:  Negative for abdominal pain, constipation, diarrhea, nausea and vomiting.  Endocrine: Negative for cold intolerance, heat intolerance, polydipsia and polyuria.       Blood  sugars doing well    Genitourinary:  Negative for dyspareunia, dysuria, flank pain, frequency and urgency.  Musculoskeletal:  Negative for arthralgias, back pain and myalgias.  Skin:  Negative for rash.       Patchy and blistered rash on the inner, upper right thigh.  Present for a few days and getting worse.  Allergic/Immunologic: Negative for environmental allergies.  Neurological:  Negative for dizziness, weakness and headaches.  Hematological:  Negative for adenopathy.  Psychiatric/Behavioral:  The patient is nervous/anxious.    Objective:   Today's Vitals   07/26/21 1459  BP: 93/66  Pulse: 67  Temp: 98.1 F (36.7 C)  SpO2: 95%  Weight: 153 lb 12.8 oz (69.8 kg)  Height: 5\' 4"  (1.626 m)   Body mass index is 26.4 kg/m.   Physical Exam Vitals and nursing note reviewed.  Constitutional:      Appearance: Normal appearance. She is well-developed.  HENT:     Head: Normocephalic and atraumatic.     Nose: Nose normal.     Mouth/Throat:     Mouth: Mucous membranes are moist.     Pharynx: Oropharynx is clear.  Eyes:     Extraocular Movements: Extraocular movements intact.     Conjunctiva/sclera: Conjunctivae normal.     Pupils: Pupils are equal, round, and reactive to light.  Cardiovascular:     Rate and Rhythm: Normal rate and regular rhythm.     Pulses: Normal pulses.     Heart sounds: Normal heart sounds.  Pulmonary:     Effort: Pulmonary effort is normal.     Breath sounds: Normal breath sounds.  Abdominal:     Palpations: Abdomen is soft.  Musculoskeletal:        General: Normal range of motion.     Cervical back: Normal range of motion and neck supple.  Lymphadenopathy:     Cervical: No cervical adenopathy.  Skin:    General: Skin is warm and dry.     Capillary Refill: Capillary refill takes less than 2 seconds.     Comments: Several, round patches of blistering rash present on the inner right thigh.  Localized inflammation present without evidence of infection.   Skin currently intact with no drainage from any lesions.  Neurological:     General: No focal deficit present.     Mental Status: She is alert and oriented to person, place, and time.  Psychiatric:        Attention and Perception: Attention and perception normal.        Mood and Affect: Affect normal. Mood is anxious.        Speech: Speech normal.        Behavior: Behavior normal. Behavior is cooperative.        Thought Content: Thought content normal.  Cognition and Memory: Cognition and memory normal.        Judgment: Judgment normal.    Assessment & Plan:  1. Encounter to establish care Appointment today to establish new primary care provider.  We will get records and progress notes from Previous PCP to review and update patient chart.  2. Herpes zoster without complication Shingles rash present on the inner upper right thigh.  Start valacyclovir 1000 mg twice daily for next 7 days. - valACYclovir (VALTREX) 1000 MG tablet; Take 1 tablet (1,000 mg total) by mouth 2 (two) times daily.  Dispense: 14 tablet; Refill: 0  3. Rash and nonspecific skin eruption May apply triamcinolone ointment twice daily as needed to reduce inflammation and itching of rash. - triamcinolone ointment (KENALOG) 0.5 %; Apply 1 application topically 2 (two) times daily.  Dispense: 30 g; Refill: 1  4. Generalized anxiety disorder Change alprazolam 1 mg prescription to twice daily as needed for acute anxiety.  Schedule prescription for number 30 tablets to her pharmacy today. - ALPRAZolam (XANAX) 1 MG tablet; Take 1 tablet (1 mg total) by mouth 2 (two) times daily as needed for anxiety.  Dispense: 30 tablet; Refill: 1  5. Body mass index 26.0-26.9, adult Discussed lowering calorie intake to 1500 calories per day and incorporating exercise into daily routine to help lose weight.    Problem List Items Addressed This Visit       Musculoskeletal and Integument   Rash and nonspecific skin eruption    Relevant Medications   triamcinolone ointment (KENALOG) 0.5 %     Other   Herpes zoster without complication   Relevant Medications   valACYclovir (VALTREX) 1000 MG tablet   Generalized anxiety disorder   Relevant Medications   ALPRAZolam (XANAX) 1 MG tablet   Body mass index 26.0-26.9, adult   Other Visit Diagnoses     Encounter to establish care    -  Primary       Outpatient Encounter Medications as of 07/26/2021  Medication Sig   acetaminophen (TYLENOL) 500 MG tablet Take 500 mg by mouth every 6 (six) hours as needed for mild pain or headache. Reported on 11/02/2015   Ascorbic Acid (VITAMIN C GUMMIE PO) Take 1 each by mouth every morning.   aspirin EC 81 MG tablet Take 1 tablet (81 mg total) by mouth daily.   [employer] ACID CONTROLLER 10 MG tablet SMARTSIG:1 Tablet(s) By Mouth Every 12 Hours PRN   dexamethasone (DECADRON) 4 MG tablet Take 1 tablet twice a day the day before, the day of, and the day after chemotherapy.   folic acid (FOLVITE) 1 MG tablet TAKE 1 TABLET BY MOUTH EVERY DAY   loratadine (CLARITIN) 10 MG tablet Take 10 mg by mouth daily.   metFORMIN (GLUCOPHAGE) 500 MG tablet Take 500 mg by mouth 2 (two) times daily.   Multiple Vitamin (MULTIVITAMIN WITH MINERALS) TABS tablet Take 1 tablet by mouth every morning.   nitrofurantoin, macrocrystal-monohydrate, (MACROBID) 100 MG capsule Take 1 capsule (100 mg total) by mouth 2 (two) times daily.   omeprazole (PRILOSEC) 20 MG capsule TAKE 1 CAPSULE BY MOUTH EVERY DAY   OVER THE COUNTER MEDICATION Take 1 tablet by mouth every morning. (Vitamin A)   prochlorperazine (COMPAZINE) 10 MG tablet Take 1 tablet (10 mg total) by mouth every 6 (six) hours as needed for nausea or vomiting.   senna-docusate (SENOKOT-S) 8.6-50 MG tablet Take 1 tablet by mouth daily.   triamcinolone ointment (KENALOG) 0.5 % Apply 1 application topically  2 (two) times daily.   Vitamin D, Ergocalciferol, (DRISDOL) 1.25 MG (50000 UNIT) CAPS capsule Take 50,000  Units by mouth once a week.   [DISCONTINUED] ALPRAZolam (XANAX) 1 MG tablet SMARTSIG:1 Tablet(s) By Mouth 1 to 3 Times Daily PRN   ALPRAZolam (XANAX) 1 MG tablet Take 1 tablet (1 mg total) by mouth 2 (two) times daily as needed for anxiety.   ondansetron (ZOFRAN) 8 MG tablet TAKE 1 TABLET BY MOUTH BEFORE CHEMO (Patient not taking: Reported on 07/26/2021)   rosuvastatin (CRESTOR) 10 MG tablet Take 1 tablet (10 mg total) by mouth daily. (Patient not taking: Reported on 07/26/2021)   sulfamethoxazole-trimethoprim (BACTRIM DS) 800-160 MG tablet Take 1 tablet by mouth 2 (two) times daily. (Patient not taking: Reported on 07/26/2021)   valACYclovir (VALTREX) 1000 MG tablet Take 1 tablet (1,000 mg total) by mouth 2 (two) times daily.   [DISCONTINUED] HYDROcodone-acetaminophen (NORCO/VICODIN) 5-325 MG tablet TAKE 1 TABLET BY MOUTH 3 TIMES A DAY FOR 5 DAYS AS NEEDED FOR PAIN (Patient not taking: Reported on 07/26/2021)   [DISCONTINUED] valACYclovir (VALTREX) 1000 MG tablet Take 1,000 mg by mouth 3 (three) times daily. (Patient not taking: Reported on 07/26/2021)   Facility-Administered Encounter Medications as of 07/26/2021  Medication   sodium chloride flush (NS) 0.9 % injection 10 mL   sodium chloride flush (NS) 0.9 % injection 10 mL    Follow-up: Return in about 6 weeks (around 09/06/2021) for health maintenance exam, FBW a week prior to visit - see below.   [name], NP  This note was dictated using Systems analyst. Rapid proofreading was performed to expedite the delivery of the information. Despite proofreading, phonetic errors will occur which are common with this voice recognition software. Please take this into consideration. If there are any concerns, please contact our office.

## 2021-07-30 MED FILL — Dexamethasone Sodium Phosphate Inj 100 MG/10ML: INTRAMUSCULAR | Qty: 1 | Status: AC

## 2021-08-01 DIAGNOSIS — F411 Generalized anxiety disorder: Secondary | ICD-10-CM | POA: Insufficient documentation

## 2021-08-01 DIAGNOSIS — B029 Zoster without complications: Secondary | ICD-10-CM | POA: Insufficient documentation

## 2021-08-01 DIAGNOSIS — R21 Rash and other nonspecific skin eruption: Secondary | ICD-10-CM | POA: Insufficient documentation

## 2021-08-01 DIAGNOSIS — Z6826 Body mass index (BMI) 26.0-26.9, adult: Secondary | ICD-10-CM | POA: Insufficient documentation

## 2021-08-01 NOTE — Patient Instructions (Signed)
Fat and Cholesterol Restricted Eating Plan Getting too much fat and cholesterol in your diet may cause health problems. Choosing the right foods helps keep your fat and cholesterol at normal levels. This can keep you from getting certain diseases. Your doctor may recommend an eating plan that includes: Total fat: ______% or less of total calories a day. This is ______g of fat a day. Saturated fat: ______% or less of total calories a day. This is ______g of saturated fat a day. Cholesterol: less than _________mg a day. Fiber: ______g a day. What are tips for following this plan? General tips Work with your doctor to lose weight if you need to. Avoid: Foods with added sugar. Fried foods. Foods with trans fat or partially hydrogenated oils. This includes some margarines and baked goods. If you drink alcohol: Limit how much you have to: 0-1 drink a day for women who are not pregnant. 0-2 drinks a day for men. Know how much alcohol is in a drink. In the [place] one drink equals one 12 oz bottle of beer (355 mL), one 5 oz glass of wine (148 mL), or one 1 oz glass of hard liquor (44 mL). Reading food labels Check food labels for: Trans fats. Partially hydrogenated oils. Saturated fat (g) in each serving. Cholesterol (mg) in each serving. Fiber (g) in each serving. Choose foods with healthy fats, such as: Monounsaturated fats and polyunsaturated fats. These include olive and canola oil, flaxseeds, walnuts, almonds, and seeds. Omega-3 fats. These are found in certain fish, flaxseed oil, and ground flaxseeds. Choose grain products that have whole grains. Look for the word "whole" as the first word in the ingredient list. Cooking Cook foods using low-fat methods. These include baking, boiling, grilling, and broiling. Eat more home-cooked foods. Eat at restaurants and buffets less often. Eat less fast food. Avoid cooking using saturated fats, such as butter, cream, palm oil, palm kernel oil, and  coconut oil. Meal planning  At meals, divide your plate into four equal parts: Fill one-half of your plate with vegetables, green salads, and fruit. Fill one-fourth of your plate with whole grains. Fill one-fourth of your plate with low-fat (lean) protein foods. Eat fish that is high in omega-3 fats at least two times a week. This includes mackerel, tuna, sardines, and salmon. Eat foods that are high in fiber, such as whole grains, beans, apples, pears, berries, broccoli, carrots, peas, and barley. What foods should I eat? Fruits All fresh, canned (in natural juice), or frozen fruits. Vegetables Fresh or frozen vegetables (raw, steamed, roasted, or grilled). Green salads. Grains Whole grains, such as whole wheat or whole grain breads, crackers, cereals, and pasta. Unsweetened oatmeal, bulgur, barley, quinoa, or brown rice. Corn or whole wheat flour tortillas. Meats and other protein foods Ground beef (85% or leaner), grass-fed beef, or beef trimmed of fat. Skinless chicken or turkey. Ground chicken or turkey. Pork trimmed of fat. All fish and seafood. Egg whites. Dried beans, peas, or lentils. Unsalted nuts or seeds. Unsalted canned beans. Nut butters without added sugar or oil. Dairy Low-fat or nonfat dairy products, such as skim or 1% milk, 2% or reduced-fat cheeses, low-fat and fat-free ricotta or cottage cheese, or plain low-fat and nonfat yogurt. Fats and oils Tub margarine without trans fats. Light or reduced-fat mayonnaise and salad dressings. Avocado. Olive, canola, sesame, or safflower oils. The items listed above may not be a complete list of foods and beverages you can eat. Contact a dietitian for more information. What foods   should I avoid? Fruits Canned fruit in heavy syrup. Fruit in cream or butter sauce. Fried fruit. Vegetables Vegetables cooked in cheese, cream, or butter sauce. Fried vegetables. Grains White bread. White pasta. White rice. Cornbread. Bagels, pastries,  and croissants. Crackers and snack foods that contain trans fat and hydrogenated oils. Meats and other protein foods Fatty cuts of meat. Ribs, chicken wings, bacon, sausage, bologna, salami, chitterlings, fatback, hot dogs, bratwurst, and packaged lunch meats. Liver and organ meats. Whole eggs and egg yolks. Chicken and turkey with skin. Fried meat. Dairy Whole or 2% milk, cream, half-and-half, and cream cheese. Whole milk cheeses. Whole-fat or sweetened yogurt. Full-fat cheeses. Nondairy creamers and whipped toppings. Processed cheese, cheese spreads, and cheese curds. Fats and oils Butter, stick margarine, lard, shortening, ghee, or bacon fat. Coconut, palm kernel, and palm oils. Beverages Alcohol. Sugar-sweetened drinks such as sodas, lemonade, and fruit drinks. Sweets and desserts Corn syrup, sugars, honey, and molasses. Candy. Jam and jelly. Syrup. Sweetened cereals. Cookies, pies, cakes, donuts, muffins, and ice cream. The items listed above may not be a complete list of foods and beverages you should avoid. Contact a dietitian for more information. Summary Choosing the right foods helps keep your fat and cholesterol at normal levels. This can keep you from getting certain diseases. At meals, fill one-half of your plate with vegetables, green salads, and fruits. Eat high fiber foods, like whole grains, beans, apples, pears, berries, carrots, peas, and barley. Limit added sugar, saturated fats, alcohol, and fried foods. This information is not intended to replace advice given to you by your health care provider. Make sure you discuss any questions you have with your health care provider. Document Revised: 12/25/2020 Document Reviewed: 12/25/2020 Elsevier Patient Education  2022 [employer]  

## 2021-08-02 ENCOUNTER — Inpatient Hospital Stay: Payer: PPO | Attending: Internal Medicine

## 2021-08-02 ENCOUNTER — Telehealth: Payer: Self-pay | Admitting: Internal Medicine

## 2021-08-02 ENCOUNTER — Inpatient Hospital Stay: Payer: PPO

## 2021-08-02 ENCOUNTER — Encounter: Payer: Self-pay | Admitting: Internal Medicine

## 2021-08-02 ENCOUNTER — Other Ambulatory Visit: Payer: Self-pay

## 2021-08-02 ENCOUNTER — Inpatient Hospital Stay (HOSPITAL_BASED_OUTPATIENT_CLINIC_OR_DEPARTMENT_OTHER): Payer: PPO | Admitting: Internal Medicine

## 2021-08-02 VITALS — BP 120/72 | HR 90 | Temp 97.7°F | Resp 19 | Ht 64.0 in | Wt 150.1 lb

## 2021-08-02 DIAGNOSIS — E119 Type 2 diabetes mellitus without complications: Secondary | ICD-10-CM | POA: Insufficient documentation

## 2021-08-02 DIAGNOSIS — N390 Urinary tract infection, site not specified: Secondary | ICD-10-CM | POA: Insufficient documentation

## 2021-08-02 DIAGNOSIS — Z7984 Long term (current) use of oral hypoglycemic drugs: Secondary | ICD-10-CM | POA: Diagnosis not present

## 2021-08-02 DIAGNOSIS — F419 Anxiety disorder, unspecified: Secondary | ICD-10-CM | POA: Diagnosis not present

## 2021-08-02 DIAGNOSIS — Z79899 Other long term (current) drug therapy: Secondary | ICD-10-CM | POA: Diagnosis not present

## 2021-08-02 DIAGNOSIS — C3411 Malignant neoplasm of upper lobe, right bronchus or lung: Secondary | ICD-10-CM | POA: Insufficient documentation

## 2021-08-02 DIAGNOSIS — Z923 Personal history of irradiation: Secondary | ICD-10-CM | POA: Diagnosis not present

## 2021-08-02 DIAGNOSIS — C7949 Secondary malignant neoplasm of other parts of nervous system: Secondary | ICD-10-CM | POA: Diagnosis not present

## 2021-08-02 DIAGNOSIS — Z5112 Encounter for antineoplastic immunotherapy: Secondary | ICD-10-CM | POA: Insufficient documentation

## 2021-08-02 DIAGNOSIS — Z7952 Long term (current) use of systemic steroids: Secondary | ICD-10-CM | POA: Diagnosis not present

## 2021-08-02 DIAGNOSIS — I10 Essential (primary) hypertension: Secondary | ICD-10-CM | POA: Insufficient documentation

## 2021-08-02 DIAGNOSIS — Z5111 Encounter for antineoplastic chemotherapy: Secondary | ICD-10-CM

## 2021-08-02 DIAGNOSIS — C7931 Secondary malignant neoplasm of brain: Secondary | ICD-10-CM | POA: Diagnosis not present

## 2021-08-02 LAB — CBC WITH DIFFERENTIAL (CANCER CENTER ONLY)
Abs Immature Granulocytes: 0.03 10*3/uL (ref 0.00–0.07)
Basophils Absolute: 0 10*3/uL (ref 0.0–0.1)
Basophils Relative: 0 %
Eosinophils Absolute: 0 10*3/uL (ref 0.0–0.5)
Eosinophils Relative: 0 %
HCT: 30.1 % — ABNORMAL LOW (ref 36.0–46.0)
Hemoglobin: 9.9 g/dL — ABNORMAL LOW (ref 12.0–15.0)
Immature Granulocytes: 1 %
Lymphocytes Relative: 9 %
Lymphs Abs: 0.4 10*3/uL — ABNORMAL LOW (ref 0.7–4.0)
MCH: 33.4 pg (ref 26.0–34.0)
MCHC: 32.9 g/dL (ref 30.0–36.0)
MCV: 101.7 fL — ABNORMAL HIGH (ref 80.0–100.0)
Monocytes Absolute: 0.4 10*3/uL (ref 0.1–1.0)
Monocytes Relative: 8 %
Neutro Abs: 4 10*3/uL (ref 1.7–7.7)
Neutrophils Relative %: 82 %
Platelet Count: 343 10*3/uL (ref 150–400)
RBC: 2.96 MIL/uL — ABNORMAL LOW (ref 3.87–5.11)
RDW: 18.5 % — ABNORMAL HIGH (ref 11.5–15.5)
WBC Count: 4.9 10*3/uL (ref 4.0–10.5)
nRBC: 0 % (ref 0.0–0.2)

## 2021-08-02 LAB — CMP (CANCER CENTER ONLY)
ALT: 11 U/L (ref 0–44)
AST: 12 U/L — ABNORMAL LOW (ref 15–41)
Albumin: 3.5 g/dL (ref 3.5–5.0)
Alkaline Phosphatase: 73 U/L (ref 38–126)
Anion gap: 9 (ref 5–15)
BUN: 23 mg/dL (ref 8–23)
CO2: 21 mmol/L — ABNORMAL LOW (ref 22–32)
Calcium: 9.5 mg/dL (ref 8.9–10.3)
Chloride: 106 mmol/L (ref 98–111)
Creatinine: 1.24 mg/dL — ABNORMAL HIGH (ref 0.44–1.00)
GFR, Estimated: 47 mL/min — ABNORMAL LOW (ref >60)
Glucose, Bld: 125 mg/dL — ABNORMAL HIGH (ref 70–99)
Potassium: 4.6 mmol/L (ref 3.5–5.1)
Sodium: 136 mmol/L (ref 135–145)
Total Bilirubin: 0.2 mg/dL — ABNORMAL LOW (ref 0.3–1.2)
Total Protein: 7.4 g/dL (ref 6.5–8.1)

## 2021-08-02 MED ORDER — SODIUM CHLORIDE 0.9 % IV SOLN
10.0000 mg | Freq: Once | INTRAVENOUS | Status: AC
  Administered 2021-08-02: 10 mg via INTRAVENOUS
  Filled 2021-08-02: qty 10

## 2021-08-02 MED ORDER — SODIUM CHLORIDE 0.9 % IV SOLN
500.0000 mg/m2 | Freq: Once | INTRAVENOUS | Status: AC
  Administered 2021-08-02: 900 mg via INTRAVENOUS
  Filled 2021-08-02: qty 20

## 2021-08-02 MED ORDER — SODIUM CHLORIDE 0.9 % IV SOLN: Freq: Once | INTRAVENOUS | Status: AC

## 2021-08-02 NOTE — Progress Notes (Signed)
[place] [phone]   Fax:(336) [phone]  OFFICE PROGRESS NOTE  [name], NP [place] [address]  DIAGNOSIS: Stage IV (T2a, N0, M1b) non-small cell lung cancer, adenocarcinoma with negative EGFR and ALK mutations diagnosed in January 2015 and presented with right upper lobe lung mass in addition to a solitary brain metastasis.  PRIOR THERAPY: 1) Status post stereotactic radiotherapy to a solitary right parietal brain lesion under the care of Dr. [name] on 10/16/2013. 2) Status post palliative radiotherapy to the right lung tumor under the care of Dr. [name] completed on 12/05/2013. 3) Systemic chemotherapy with carboplatin for AUC of 5 and Alimta 500 mg/M2 every 3 weeks. First dose Jan 06 2014. Status post 6 cycles.  CURRENT THERAPY: Systemic chemotherapy with maintenance Alimta 500 MG/M2 every 3 weeks, status post 123 cycles.  INTERVAL HISTORY: Alexis [name] 70 y.o. female returns to the clinic today for follow-up visit.  The patient is feeling fine today with no concerning complaints.  She denied having any current chest pain, shortness of breath, cough or hemoptysis.  She was diagnosed recently with shingles on her right leg and treated with antiviral medication by her primary care physician but she does not remember the name.  It is improving.  She denied having any current nausea, vomiting, diarrhea or constipation.  She has no headache or visual changes.  She has no fever or chills.  She has no significant weight loss or night sweats.  She is under a lot of stress taking care of her husband.  She is here today for evaluation before starting cycle #124.   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  MEDICATIONS:  Current Outpatient Medications  Medication Sig Dispense Refill   acetaminophen (TYLENOL) 500 MG tablet Take 500 mg by mouth every 6 (six) hours as needed for mild pain or headache. Reported on 11/02/2015     ALPRAZolam (XANAX) 1 MG tablet Take 1 tablet (1 mg total) by mouth 2 (two) times daily as needed for anxiety. 30 tablet 1   Ascorbic Acid (VITAMIN C GUMMIE PO) Take 1 each by mouth every morning.     aspirin EC 81 MG tablet Take 1 tablet (81 mg total) by mouth daily. 150 tablet 2   [employer] ACID CONTROLLER 10 MG tablet SMARTSIG:1 Tablet(s) By Mouth Every 12 Hours PRN     dexamethasone (DECADRON) 4 MG tablet Take 1 tablet twice a day the day before, the day of, and the day after chemotherapy. 40 tablet 2   folic acid (FOLVITE) 1 MG tablet TAKE 1 TABLET BY MOUTH EVERY DAY 90 tablet 0   loratadine (CLARITIN) 10 MG tablet Take 10 mg by mouth daily.     metFORMIN (GLUCOPHAGE) 500 MG tablet Take 500 mg by mouth 2 (two) times daily.     Multiple Vitamin (MULTIVITAMIN WITH MINERALS) TABS tablet Take 1 tablet by mouth every morning.     nitrofurantoin, macrocrystal-monohydrate, (MACROBID) 100 MG capsule Take 1 capsule (100 mg total) by mouth 2 (  two) times daily. 10 capsule 0   omeprazole (PRILOSEC) 20 MG capsule TAKE 1 CAPSULE BY MOUTH EVERY DAY 90 capsule 2   ondansetron (ZOFRAN) 8 MG tablet TAKE 1 TABLET BY MOUTH BEFORE CHEMO (Patient not taking: Reported on 07/26/2021) 30 tablet 0   OVER THE COUNTER MEDICATION Take 1 tablet by mouth every morning. (Vitamin A)     prochlorperazine (COMPAZINE) 10 MG tablet Take 1 tablet (10 mg total) by mouth every 6 (six) hours as needed for nausea or vomiting. 60 tablet 0   rosuvastatin (CRESTOR) 10 MG tablet Take 1 tablet (10 mg total) by mouth daily. (Patient not taking: Reported on 07/26/2021) 30 tablet 12    senna-docusate (SENOKOT-S) 8.6-50 MG tablet Take 1 tablet by mouth daily. 30 tablet prn   sulfamethoxazole-trimethoprim (BACTRIM DS) 800-160 MG tablet Take 1 tablet by mouth 2 (two) times daily. (Patient not taking: Reported on 07/26/2021) 6 tablet 0   triamcinolone ointment (KENALOG) 0.5 % Apply 1 application topically 2 (two) times daily. 30 g 1   valACYclovir (VALTREX) 1000 MG tablet Take 1 tablet (1,000 mg total) by mouth 2 (two) times daily. 14 tablet 0   Vitamin D, Ergocalciferol, (DRISDOL) 1.25 MG (50000 UNIT) CAPS capsule Take 50,000 Units by mouth once a week.     No current facility-administered medications for this visit.   Facility-Administered Medications Ordered in Other Visits  Medication Dose Route Frequency Provider Last Rate Last Admin   sodium chloride flush (NS) 0.9 % injection 10 mL  10 mL Intravenous PRN Curt Bears, MD       sodium chloride flush (NS) 0.9 % injection 10 mL  10 mL Intravenous PRN Curt Bears, MD        SURGICAL HISTORY:  Past Surgical History:  Procedure Laterality Date   ABDOMINAL HYSTERECTOMY     COLOSTOMY TAKEDOWN N/A 07/10/2014   Procedure: LAPAROSCOPIC LYSIS OF ADHESIONS (90 MIN) LAPAROSCOPIC ASSISTED COLOSTOMY CLOSURE, RIGID PROCTOSIGMOIDOSCOPY;  Surgeon: Jackolyn Confer, MD;  Location: [facility];  Service: General;  Laterality: N/A;   LAPAROTOMY N/A 11/03/2013   Procedure: EXPLORATORY LAPAROTOMY, DRAINAGE OF INTRA  ABDOMINAL ABSCESSES, MOBILIZATION OF SPLENIC FLEXURE, SIGMOID COLECTOMY WITH COLOSTOMY;  Surgeon: Odis Hollingshead, MD;  Location: [facility];  Service: General;  Laterality: N/A;   VIDEO BRONCHOSCOPY Bilateral 08/30/2013   Procedure: VIDEO BRONCHOSCOPY WITH FLUORO;  Surgeon: Tanda Rockers, MD;  Location: [facility];  Service: Cardiopulmonary;  Laterality: Bilateral;    REVIEW OF SYSTEMS:  A comprehensive review of systems was negative except for: Constitutional: positive for fatigue   PHYSICAL EXAMINATION: General appearance:  alert, cooperative, fatigued, and no distress Head: Normocephalic, without obvious abnormality, atraumatic Neck: no adenopathy, no JVD, supple, symmetrical, trachea midline, and thyroid not enlarged, symmetric, no tenderness/mass/nodules Lymph nodes: Cervical, supraclavicular, and axillary nodes normal. Resp: clear to auscultation bilaterally Back: symmetric, no curvature. ROM normal. No CVA tenderness. Cardio: regular rate and rhythm, S1, S2 normal, no murmur, click, rub or gallop GI: soft, non-tender; bowel sounds normal; no masses,  no organomegaly Extremities: extremities normal, atraumatic, no cyanosis or edema   ECOG PERFORMANCE STATUS: 1 - Symptomatic but completely ambulatory   Blood pressure 120/72, pulse 90, temperature 97.7 F (36.5 C), temperature source Tympanic, resp. rate 19, height _0  (1.626 m), weight 150 lb 1.6 oz (68.1 kg), SpO2 98 %.  LABORATORY DATA: Lab Results  Component Value Date   WBC 4.9 08/02/2021   HGB 9.9 (L) 08/02/2021   HCT 30.1 (L) 08/02/2021  MCV 101.7 (H) 08/02/2021   PLT 343 08/02/2021      Chemistry      Component Value Date/Time   NA 140 07/12/2021 1117   NA 139 08/14/2017 0837   K 3.9 07/12/2021 1117   K 4.4 08/14/2017 0837   CL 107 07/12/2021 1117   CO2 23 07/12/2021 1117   CO2 24 08/14/2017 0837   BUN 16 07/12/2021 1117   BUN 13.3 08/14/2017 0837   CREATININE 0.81 07/12/2021 1117   CREATININE 0.8 08/14/2017 0837      Component Value Date/Time   CALCIUM 9.7 07/12/2021 1117   CALCIUM 9.3 08/14/2017 0837   ALKPHOS 72 07/12/2021 1117   ALKPHOS 107 08/14/2017 0837   AST 11 (L) 07/12/2021 1117   AST 12 08/14/2017 0837   ALT 8 07/12/2021 1117   ALT 12 08/14/2017 0837   BILITOT 0.3 07/12/2021 1117   BILITOT 0.37 08/14/2017 0837       RADIOGRAPHIC STUDIES: MR Brain W Wo Contrast  Result Date: 07/09/2021 CLINICAL DATA:  Brain/CNS neoplasm, surveillance; metastatic lung cancer. EXAM: MRI HEAD WITHOUT AND WITH CONTRAST  TECHNIQUE: Multiplanar, multiecho pulse sequences of the brain and surrounding structures were obtained without and with intravenous contrast. CONTRAST:  56m MULTIHANCE GADOBENATE DIMEGLUMINE 529 MG/ML IV SOLN COMPARISON:  MR head 01/07/2021. FINDINGS: Brain: Right parietal heterogeneously enhancing treated lesion is again identified and is without substantial change in size when allowing for differences in measurement technique. Presently measures 1.6 x 1.3 cm. No substantial change in surrounding T2 hyperintensity. Unchanged associated susceptibility reflecting chronic blood products. No new mass or abnormal enhancement. There is no acute infarction or intracranial hemorrhage. There is no hydrocephalus or extra-axial fluid collection. Ventricles and sulci are stable in size and configuration. Unchanged additional patchy and confluent areas of T2 hyperintensity in the supratentorial and pontine white matter likely reflecting chronic microvascular ischemic changes. Vascular: Major vessel flow voids at the skull base are preserved. Skull and upper cervical spine: Normal marrow signal is preserved. Sinuses/Orbits: Patchy mucosal thickening. Bilateral lens replacements. Other: Sella is unremarkable.  Patchy mastoid fluid opacification. IMPRESSION: Continued stability of treated right parietal lesion. No evidence of new metastatic disease. Electronically Signed   By: PMacy MisM.D.   On: 07/09/2021 10:03   CT CHEST ABDOMEN PELVIS W CONTRAST  Result Date: 07/09/2021 CLINICAL DATA:  Non-small-cell lung cancer.  Restaging. EXAM: CT CHEST, ABDOMEN, AND PELVIS WITH CONTRAST TECHNIQUE: Multidetector CT imaging of the chest, abdomen and pelvis was performed following the standard protocol during bolus administration of intravenous contrast. CONTRAST:  1032mISOVUE-300 IOPAMIDOL (ISOVUE-300) INJECTION 61% COMPARISON:  02/11/2021 FINDINGS: CT CHEST FINDINGS Cardiovascular: The heart size is normal. No substantial  pericardial effusion. Mild atherosclerotic calcification is noted in the wall of the thoracic aorta. Mediastinum/Nodes: No mediastinal lymphadenopathy. There is no hilar lymphadenopathy. The esophagus has normal imaging features. There is no axillary lymphadenopathy. Lungs/Pleura: Centrilobular and paraseptal emphysema evident. Stable post treatment scarring medial right upper lobe extending into the right apex. Stable 6 mm right lower lobe ground-glass opacity on 01/02/6. Tiny ground-glass nodule in the right lower lobe on 89/6 is unchanged. Previously identified right middle lobe subpleural nodule on 117/6 is stable. 2 mm posterior left upper lobe nodule identified on the previous exam has resolved in the interval. No new suspicious pulmonary nodule or mass. No focal airspace consolidation. No pleural effusion. Musculoskeletal: No worrisome lytic or sclerotic osseous abnormality. CT ABDOMEN PELVIS FINDINGS Hepatobiliary: No suspicious focal abnormality within the liver parenchyma.  There is no evidence for gallstones, gallbladder wall thickening, or pericholecystic fluid. No intrahepatic or extrahepatic biliary dilation. Pancreas: No focal mass lesion. No dilatation of the main duct. No intraparenchymal cyst. No peripancreatic edema. Spleen: No splenomegaly. No focal mass lesion. Adrenals/Urinary Tract: No adrenal nodule or mass. Kidneys unremarkable. No evidence for hydroureter. The urinary bladder appears normal for the degree of distention. Stomach/Bowel: Stomach is unremarkable. No gastric wall thickening. No evidence of outlet obstruction. Duodenum is normally positioned as is the ligament of Treitz. No small bowel wall thickening. No small bowel dilatation. The terminal ileum is normal. The appendix is normal. Left colonic anastomosis evident. Vascular/Lymphatic: There is moderate atherosclerotic calcification of the abdominal aorta without aneurysm. There is no gastrohepatic or hepatoduodenal ligament  lymphadenopathy. No retroperitoneal or mesenteric lymphadenopathy. No pelvic sidewall lymphadenopathy. Reproductive: Uterus surgically absent.  There is no adnexal mass. Other: No intraperitoneal free fluid. Musculoskeletal: No worrisome lytic or sclerotic osseous abnormality. IMPRESSION: 1. Stable exam. No new or progressive findings to suggest recurrent or metastatic disease in the chest, abdomen, or pelvis. 2. Stable post treatment scarring medial right upper lobe extending into the right apex. 3. Scattered tiny bilateral pulmonary nodules, unchanged. The new 2 mm posterior left upper lobe nodule identified previously has resolved in the interval. 4. Aortic Atherosclerosis (ICD10-I70.0) and Emphysema (ICD10-J43.9). Electronically Signed   By: Misty Stanley M.D.   On: 07/09/2021 15:29    ASSESSMENT AND PLAN:  This is a very pleasant 70 years old white female with metastatic non-small cell lung cancer, adenocarcinoma status post induction systemic chemotherapy with carboplatin and Alimta with partial response. The patient is currently on maintenance treatment with single agent Alimta status post 123 cycles. She has been tolerating this treatment well with no concerning adverse effects. I recommended for the patient to proceed with cycle #124 today. I will see her back for follow-up visit in 3 weeks for evaluation before the next cycle of her treatment. For the hypertension, she will continue with her current blood pressure medication.  It is much better controlled now. For the anxiety she is currently on Xanax. She was advised to call immediately if she has any other concerning symptoms in the interval. The patient voices understanding of current disease status and treatment options and is in agreement with the current care plan. All questions were answered. The patient knows to call the clinic with any problems, questions or concerns. We can certainly see the patient much sooner if  necessary.  Disclaimer: This note was dictated with voice recognition software. Similar sounding words can inadvertently be transcribed and may not be corrected upon review.

## 2021-08-02 NOTE — Telephone Encounter (Signed)
Scheduled per 12/05 los, patient received updated calender.

## 2021-08-18 ENCOUNTER — Other Ambulatory Visit: Payer: Self-pay

## 2021-08-18 DIAGNOSIS — Z7689 Persons encountering health services in other specified circumstances: Secondary | ICD-10-CM

## 2021-08-18 DIAGNOSIS — Z0000 Encounter for general adult medical examination without abnormal findings: Secondary | ICD-10-CM

## 2021-08-18 DIAGNOSIS — N952 Postmenopausal atrophic vaginitis: Secondary | ICD-10-CM | POA: Diagnosis not present

## 2021-08-18 DIAGNOSIS — Z1329 Encounter for screening for other suspected endocrine disorder: Secondary | ICD-10-CM

## 2021-08-18 DIAGNOSIS — N3020 Other chronic cystitis without hematuria: Secondary | ICD-10-CM | POA: Diagnosis not present

## 2021-08-24 ENCOUNTER — Inpatient Hospital Stay (HOSPITAL_BASED_OUTPATIENT_CLINIC_OR_DEPARTMENT_OTHER): Payer: PPO | Admitting: Internal Medicine

## 2021-08-24 ENCOUNTER — Inpatient Hospital Stay: Payer: PPO

## 2021-08-24 ENCOUNTER — Other Ambulatory Visit: Payer: Self-pay

## 2021-08-24 VITALS — BP 113/61 | HR 87 | Temp 97.3°F | Resp 18 | Wt 152.4 lb

## 2021-08-24 DIAGNOSIS — D6481 Anemia due to antineoplastic chemotherapy: Secondary | ICD-10-CM | POA: Diagnosis not present

## 2021-08-24 DIAGNOSIS — Z5112 Encounter for antineoplastic immunotherapy: Secondary | ICD-10-CM | POA: Diagnosis not present

## 2021-08-24 DIAGNOSIS — T451X5A Adverse effect of antineoplastic and immunosuppressive drugs, initial encounter: Secondary | ICD-10-CM | POA: Diagnosis not present

## 2021-08-24 DIAGNOSIS — C3411 Malignant neoplasm of upper lobe, right bronchus or lung: Secondary | ICD-10-CM

## 2021-08-24 DIAGNOSIS — Z5111 Encounter for antineoplastic chemotherapy: Secondary | ICD-10-CM

## 2021-08-24 LAB — CBC WITH DIFFERENTIAL/PLATELET
Abs Immature Granulocytes: 0.05 10*3/uL (ref 0.00–0.07)
Basophils Absolute: 0 10*3/uL (ref 0.0–0.1)
Basophils Relative: 0 %
Eosinophils Absolute: 0 10*3/uL (ref 0.0–0.5)
Eosinophils Relative: 0 %
HCT: 32.1 % — ABNORMAL LOW (ref 36.0–46.0)
Hemoglobin: 10.5 g/dL — ABNORMAL LOW (ref 12.0–15.0)
Immature Granulocytes: 1 %
Lymphocytes Relative: 8 %
Lymphs Abs: 0.5 10*3/uL — ABNORMAL LOW (ref 0.7–4.0)
MCH: 34 pg (ref 26.0–34.0)
MCHC: 32.7 g/dL (ref 30.0–36.0)
MCV: 103.9 fL — ABNORMAL HIGH (ref 80.0–100.0)
Monocytes Absolute: 0.5 10*3/uL (ref 0.1–1.0)
Monocytes Relative: 10 %
Neutro Abs: 4.5 10*3/uL (ref 1.7–7.7)
Neutrophils Relative %: 81 %
Platelets: 369 10*3/uL (ref 150–400)
RBC: 3.09 MIL/uL — ABNORMAL LOW (ref 3.87–5.11)
RDW: 17.8 % — ABNORMAL HIGH (ref 11.5–15.5)
WBC: 5.6 10*3/uL (ref 4.0–10.5)
nRBC: 0 % (ref 0.0–0.2)

## 2021-08-24 LAB — COMPREHENSIVE METABOLIC PANEL
ALT: 11 U/L (ref 0–44)
AST: 14 U/L — ABNORMAL LOW (ref 15–41)
Albumin: 3.8 g/dL (ref 3.5–5.0)
Alkaline Phosphatase: 65 U/L (ref 38–126)
Anion gap: 8 (ref 5–15)
BUN: 18 mg/dL (ref 8–23)
CO2: 26 mmol/L (ref 22–32)
Calcium: 9.5 mg/dL (ref 8.9–10.3)
Chloride: 104 mmol/L (ref 98–111)
Creatinine, Ser: 1.05 mg/dL — ABNORMAL HIGH (ref 0.44–1.00)
GFR, Estimated: 58 mL/min — ABNORMAL LOW (ref >60)
Glucose, Bld: 116 mg/dL — ABNORMAL HIGH (ref 70–99)
Potassium: 4.7 mmol/L (ref 3.5–5.1)
Sodium: 138 mmol/L (ref 135–145)
Total Bilirubin: 0.3 mg/dL (ref 0.3–1.2)
Total Protein: 7.1 g/dL (ref 6.5–8.1)

## 2021-08-24 MED ORDER — SODIUM CHLORIDE 0.9 % IV SOLN: Freq: Once | INTRAVENOUS | Status: AC

## 2021-08-24 MED ORDER — SODIUM CHLORIDE 0.9 % IV SOLN
500.0000 mg/m2 | Freq: Once | INTRAVENOUS | Status: AC
  Administered 2021-08-24: 11:00:00 900 mg via INTRAVENOUS
  Filled 2021-08-24: qty 20

## 2021-08-24 MED ORDER — CYANOCOBALAMIN 1000 MCG/ML IJ SOLN
1000.0000 ug | Freq: Once | INTRAMUSCULAR | Status: AC
  Administered 2021-08-24: 10:00:00 1000 ug via INTRAMUSCULAR
  Filled 2021-08-24: qty 1

## 2021-08-24 MED ORDER — SODIUM CHLORIDE 0.9 % IV SOLN
10.0000 mg | Freq: Once | INTRAVENOUS | Status: AC
  Administered 2021-08-24: 10:00:00 10 mg via INTRAVENOUS
  Filled 2021-08-24: qty 10

## 2021-08-24 NOTE — Progress Notes (Signed)
[place] [phone]) [phone]   Fax:(336) [phone]  OFFICE PROGRESS NOTE  [name], NP [place] [address]  DIAGNOSIS: Stage IV (T2a, N0, M1b) non-small cell lung cancer, adenocarcinoma with negative EGFR and ALK mutations diagnosed in January 2015 and presented with right upper lobe lung mass in addition to a solitary brain metastasis.  PRIOR THERAPY: 1) Status post stereotactic radiotherapy to a solitary right parietal brain lesion under the care of Dr. [name] on 10/16/2013. 2) Status post palliative radiotherapy to the right lung tumor under the care of Dr. [name] completed on 12/05/2013. 3) Systemic chemotherapy with carboplatin for AUC of 5 and Alimta 500 mg/M2 every 3 weeks. First dose Jan 06 2014. Status post 6 cycles.  CURRENT THERAPY: Systemic chemotherapy with maintenance Alimta 500 MG/M2 every 3 weeks, status post 124 cycles.  INTERVAL HISTORY: Alexis [name] 70 y.o. female returns to the clinic today for follow-up visit.  The patient is feeling fine today with no concerning complaints except for mild fatigue.  She denied having any current chest pain, shortness of breath, cough or hemoptysis.  She denied having any fever or chills.  She denied having any nausea, vomiting, diarrhea or constipation.  She denied having any headache or visual changes.  She has no weight loss or night sweats.  She continues to tolerate her maintenance treatment with Alimta fairly well.  She is here today for evaluation before starting cycle #125.   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  MEDICATIONS:  Current Outpatient Medications  Medication Sig Dispense Refill   acetaminophen (TYLENOL) 500 MG tablet Take 500 mg by mouth every 6 (six) hours as needed for mild pain or headache. Reported on 11/02/2015     ALPRAZolam (XANAX) 1 MG tablet Take 1 tablet (1 mg total) by mouth 2 (two) times daily as needed for anxiety. 30 tablet 1   Ascorbic Acid (VITAMIN C GUMMIE PO) Take 1 each by mouth every morning.     aspirin EC 81 MG tablet Take 1 tablet (81 mg total) by mouth daily. 150 tablet 2   [employer] ACID CONTROLLER 10 MG tablet SMARTSIG:1 Tablet(s) By Mouth Every 12 Hours PRN     dexamethasone (DECADRON) 4 MG tablet Take 1 tablet twice a day the day before, the day of, and the day after chemotherapy. 40 tablet 2   folic acid (FOLVITE) 1 MG tablet TAKE 1 TABLET BY MOUTH EVERY DAY 90 tablet 0   loratadine (CLARITIN) 10 MG tablet Take 10 mg by mouth daily.     metFORMIN (GLUCOPHAGE) 500 MG tablet Take 500 mg by mouth 2 (two) times daily.     Multiple Vitamin (MULTIVITAMIN WITH MINERALS) TABS tablet Take 1 tablet by mouth every morning.     nitrofurantoin, macrocrystal-monohydrate, (MACROBID) 100 MG capsule Take 1 capsule (100 mg total) by mouth 2 (two) times daily. 10 capsule 0   omeprazole (PRILOSEC) 20 MG capsule TAKE 1 CAPSULE BY MOUTH EVERY DAY 90 capsule 2   ondansetron (ZOFRAN) 8 MG  tablet TAKE 1 TABLET BY MOUTH BEFORE CHEMO 30 tablet 0   OVER THE COUNTER MEDICATION Take 1 tablet by mouth every morning. (Vitamin A)     prochlorperazine (COMPAZINE) 10 MG tablet Take 1 tablet (10 mg total) by mouth every 6 (six) hours as needed for nausea or vomiting. 60 tablet 0   rosuvastatin (CRESTOR) 10 MG tablet Take 1 tablet (10 mg total) by mouth daily. 30 tablet 12   senna-docusate (SENOKOT-S) 8.6-50 MG tablet Take 1 tablet by mouth daily. 30 tablet prn   sulfamethoxazole-trimethoprim (BACTRIM DS) 800-160 MG tablet Take 1 tablet by mouth 2 (two) times daily. 6 tablet 0   triamcinolone  ointment (KENALOG) 0.5 % Apply 1 application topically 2 (two) times daily. 30 g 1   valACYclovir (VALTREX) 1000 MG tablet Take 1 tablet (1,000 mg total) by mouth 2 (two) times daily. 14 tablet 0   Vitamin D, Ergocalciferol, (DRISDOL) 1.25 MG (50000 UNIT) CAPS capsule Take 50,000 Units by mouth once a week.     No current facility-administered medications for this visit.   Facility-Administered Medications Ordered in Other Visits  Medication Dose Route Frequency Provider Last Rate Last Admin   sodium chloride flush (NS) 0.9 % injection 10 mL  10 mL Intravenous PRN Curt Bears, MD       sodium chloride flush (NS) 0.9 % injection 10 mL  10 mL Intravenous PRN Curt Bears, MD        SURGICAL HISTORY:  Past Surgical History:  Procedure Laterality Date   ABDOMINAL HYSTERECTOMY     COLOSTOMY TAKEDOWN N/A 07/10/2014   Procedure: LAPAROSCOPIC LYSIS OF ADHESIONS (90 MIN) LAPAROSCOPIC ASSISTED COLOSTOMY CLOSURE, RIGID PROCTOSIGMOIDOSCOPY;  Surgeon: Jackolyn Confer, MD;  Location: [facility];  Service: General;  Laterality: N/A;   LAPAROTOMY N/A 11/03/2013   Procedure: EXPLORATORY LAPAROTOMY, DRAINAGE OF INTRA  ABDOMINAL ABSCESSES, MOBILIZATION OF SPLENIC FLEXURE, SIGMOID COLECTOMY WITH COLOSTOMY;  Surgeon: Odis Hollingshead, MD;  Location: [facility];  Service: General;  Laterality: N/A;   VIDEO BRONCHOSCOPY Bilateral 08/30/2013   Procedure: VIDEO BRONCHOSCOPY WITH FLUORO;  Surgeon: Tanda Rockers, MD;  Location: [facility];  Service: Cardiopulmonary;  Laterality: Bilateral;    REVIEW OF SYSTEMS:  A comprehensive review of systems was negative except for: Constitutional: positive for fatigue   PHYSICAL EXAMINATION: General appearance: alert, cooperative, fatigued, and no distress Head: Normocephalic, without obvious abnormality, atraumatic Neck: no adenopathy, no JVD, supple, symmetrical, trachea midline, and thyroid not enlarged, symmetric, no tenderness/mass/nodules Lymph nodes: Cervical,  supraclavicular, and axillary nodes normal. Resp: clear to auscultation bilaterally Back: symmetric, no curvature. ROM normal. No CVA tenderness. Cardio: regular rate and rhythm, S1, S2 normal, no murmur, click, rub or gallop GI: soft, non-tender; bowel sounds normal; no masses,  no organomegaly Extremities: extremities normal, atraumatic, no cyanosis or edema   ECOG PERFORMANCE STATUS: 1 - Symptomatic but completely ambulatory   Blood pressure 113/61, pulse 87, temperature (!) 97.3 F (36.3 C), temperature source Temporal, resp. rate 18, weight 152 lb 6 oz (69.1 kg), SpO2 100 %.  LABORATORY DATA: Lab Results  Component Value Date   WBC 5.6 08/24/2021   HGB 10.5 (L) 08/24/2021   HCT 32.1 (L) 08/24/2021   MCV 103.9 (H) 08/24/2021   PLT 369 08/24/2021      Chemistry      Component Value Date/Time   NA 136 08/02/2021 0817   NA 139 08/14/2017 0837   K 4.6 08/02/2021 0817   K 4.4 08/14/2017 0837   CL 106  08/02/2021 0817   CO2 21 (L) 08/02/2021 0817   CO2 24 08/14/2017 0837   BUN 23 08/02/2021 0817   BUN 13.3 08/14/2017 0837   CREATININE 1.24 (H) 08/02/2021 0817   CREATININE 0.8 08/14/2017 0837      Component Value Date/Time   CALCIUM 9.5 08/02/2021 0817   CALCIUM 9.3 08/14/2017 0837   ALKPHOS 73 08/02/2021 0817   ALKPHOS 107 08/14/2017 0837   AST 12 (L) 08/02/2021 0817   AST 12 08/14/2017 0837   ALT 11 08/02/2021 0817   ALT 12 08/14/2017 0837   BILITOT 0.2 (L) 08/02/2021 0817   BILITOT 0.37 08/14/2017 0837       RADIOGRAPHIC STUDIES: No results found.  ASSESSMENT AND PLAN:  This is a very pleasant 70 years old white female with metastatic non-small cell lung cancer, adenocarcinoma status post induction systemic chemotherapy with carboplatin and Alimta with partial response. The patient is currently on maintenance treatment with single agent Alimta status post 124 cycles. The patient continues to tolerate her treatment fairly well with no concerning adverse  effects. I recommended for her to continue her current treatment with maintenance Alimta and she will proceed with cycle #125 today. I will see the patient back for follow-up visit in 3 weeks for evaluation before the next cycle of her treatment. For the urinary tract infection she is currently on trimethoprim 100 mg p.o. every night. For the hypertension, she will continue with her current blood pressure medication.  It is much better controlled now. For the anxiety she is currently on Xanax. She was advised to call immediately if she has any other concerning symptoms in the interval. The patient voices understanding of current disease status and treatment options and is in agreement with the current care plan. All questions were answered. The patient knows to call the clinic with any problems, questions or concerns. We can certainly see the patient much sooner if necessary.  Disclaimer: This note was dictated with voice recognition software. Similar sounding words can inadvertently be transcribed and may not be corrected upon review.

## 2021-08-26 ENCOUNTER — Other Ambulatory Visit: Payer: Self-pay | Admitting: Physician Assistant

## 2021-08-26 DIAGNOSIS — C3490 Malignant neoplasm of unspecified part of unspecified bronchus or lung: Secondary | ICD-10-CM

## 2021-08-27 ENCOUNTER — Other Ambulatory Visit: Payer: Self-pay | Admitting: Nurse Practitioner

## 2021-08-27 DIAGNOSIS — Z0000 Encounter for general adult medical examination without abnormal findings: Secondary | ICD-10-CM

## 2021-08-27 DIAGNOSIS — Z6826 Body mass index (BMI) 26.0-26.9, adult: Secondary | ICD-10-CM

## 2021-08-27 DIAGNOSIS — D649 Anemia, unspecified: Secondary | ICD-10-CM

## 2021-08-27 DIAGNOSIS — E119 Type 2 diabetes mellitus without complications: Secondary | ICD-10-CM

## 2021-08-31 ENCOUNTER — Encounter: Payer: Self-pay | Admitting: Nurse Practitioner

## 2021-08-31 ENCOUNTER — Other Ambulatory Visit: Payer: Self-pay

## 2021-08-31 ENCOUNTER — Ambulatory Visit (INDEPENDENT_AMBULATORY_CARE_PROVIDER_SITE_OTHER): Payer: PPO | Admitting: Nurse Practitioner

## 2021-08-31 ENCOUNTER — Other Ambulatory Visit: Payer: PPO

## 2021-08-31 ENCOUNTER — Encounter: Payer: Self-pay | Admitting: Internal Medicine

## 2021-08-31 VITALS — BP 105/69 | HR 100 | Temp 98.4°F | Ht 64.0 in | Wt 148.8 lb

## 2021-08-31 DIAGNOSIS — J0140 Acute pansinusitis, unspecified: Secondary | ICD-10-CM | POA: Diagnosis not present

## 2021-08-31 DIAGNOSIS — J029 Acute pharyngitis, unspecified: Secondary | ICD-10-CM | POA: Insufficient documentation

## 2021-08-31 DIAGNOSIS — D649 Anemia, unspecified: Secondary | ICD-10-CM

## 2021-08-31 DIAGNOSIS — R051 Acute cough: Secondary | ICD-10-CM | POA: Diagnosis not present

## 2021-08-31 DIAGNOSIS — Z0000 Encounter for general adult medical examination without abnormal findings: Secondary | ICD-10-CM

## 2021-08-31 DIAGNOSIS — C3411 Malignant neoplasm of upper lobe, right bronchus or lung: Secondary | ICD-10-CM

## 2021-08-31 DIAGNOSIS — R490 Dysphonia: Secondary | ICD-10-CM | POA: Insufficient documentation

## 2021-08-31 DIAGNOSIS — Z6826 Body mass index (BMI) 26.0-26.9, adult: Secondary | ICD-10-CM

## 2021-08-31 DIAGNOSIS — E119 Type 2 diabetes mellitus without complications: Secondary | ICD-10-CM

## 2021-08-31 LAB — POCT INFLUENZA A/B: Influenza A, POC: NEGATIVE

## 2021-08-31 LAB — POCT RAPID STREP A (OFFICE): Rapid Strep A Screen: NEGATIVE

## 2021-08-31 MED ORDER — METHYLPREDNISOLONE SODIUM SUCC 125 MG IJ SOLR
125.0000 mg | Freq: Once | INTRAMUSCULAR | Status: AC
  Administered 2021-08-31: 125 mg via INTRAMUSCULAR

## 2021-08-31 MED ORDER — AZITHROMYCIN 250 MG PO TABS: ORAL_TABLET | ORAL | 0 refills | Status: DC

## 2021-08-31 NOTE — Progress Notes (Signed)
Acute Office Visit  Subjective:    Patient ID: Alexis [name], female    DOB: 1951-01-22, 71 y.o.   MRN: [id number]  Chief Complaint  Patient presents with   Sore Throat   Ear Fullness    Was test for COVID 19 at urgent care this past Saturday. Test results were negative. She was not tested for strep or flu. Was given prescriptions for doxycycline twice daily and promethazine DM for cough.  The patient does have long cancer in right lower lobe of the lungs. Currently sees oncology on routine basis.  -sees urology due to frequent UTIs. Currently on a daily antibiotic and cream and has not had urinary infection since starting on these medications.   URI  This is a new problem. The current episode started in the past 7 days. The problem has been unchanged. The maximum temperature recorded prior to her arrival was 100.4 - 100.9 F. Associated symptoms include congestion, coughing, diarrhea, ear pain, headaches, nausea, a plugged ear sensation, rhinorrhea, sinus pain, sneezing, a sore throat and wheezing. Pertinent negatives include no abdominal pain, chest pain, dysuria, rash or vomiting. She has tried acetaminophen, decongestant, increased fluids and sleep (has been on antibiotics and cough suppressant since Saturday. patient has had no relief.) for the symptoms. The treatment provided no relief.    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    Past Surgical History:  Procedure Laterality Date   ABDOMINAL HYSTERECTOMY     COLOSTOMY TAKEDOWN N/A 07/10/2014   Procedure: LAPAROSCOPIC LYSIS OF ADHESIONS (90 MIN) LAPAROSCOPIC ASSISTED COLOSTOMY CLOSURE, RIGID PROCTOSIGMOIDOSCOPY;  Surgeon:  Jackolyn Confer, MD;  Location: [facility];  Service: General;  Laterality: N/A;   LAPAROTOMY N/A 11/03/2013   Procedure: EXPLORATORY LAPAROTOMY, DRAINAGE OF INTRA  ABDOMINAL ABSCESSES, MOBILIZATION OF SPLENIC FLEXURE, SIGMOID COLECTOMY WITH COLOSTOMY;  Surgeon: Odis Hollingshead, MD;  Location: [facility];  Service: General;  Laterality: N/A;   VIDEO BRONCHOSCOPY Bilateral 08/30/2013   Procedure: VIDEO BRONCHOSCOPY WITH FLUORO;  Surgeon: Tanda Rockers, MD;  Location: [facility];  Service: Cardiopulmonary;  Laterality: Bilateral;    Family History  Problem Relation Age of Onset   Emphysema Father        smoked   Lung cancer Father        smoked   Cancer Mother    Hypertension Mother    COPD Mother     Social History   Socioeconomic History   Marital status: Married    Spouse name: Not on file   Number of children: Not on file   Years of education: Not on file   Highest education level: Not on file  Occupational History   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  Tobacco Use   Smoking status: Former    Packs/day: 1.00    Years: 40.00    Pack years: 40.00    Types: Cigarettes    Quit date: 09/27/2013    Years since quitting: 7.9   Smokeless tobacco: Never  Vaping Use   Vaping Use: Never used  Substance and Sexual Activity   Alcohol use: No   Drug use: No   Sexual  activity: Yes  Other Topics Concern   Not on file  Social History Narrative   Not on file   Social Determinants of Health   Financial Resource Strain: Not on file  Food Insecurity: Not on file  Transportation Needs: Not on file  Physical Activity: Not on file  Stress: Not on file  Social Connections: Not on file  Intimate Partner Violence: Not on file    Outpatient Medications Prior to Visit  Medication Sig Dispense Refill   acetaminophen (TYLENOL) 500 MG tablet Take 500 mg by mouth every 6 (six) hours as needed for mild pain or headache. Reported on 11/02/2015     ALPRAZolam (XANAX) 1 MG tablet Take  1 tablet (1 mg total) by mouth 2 (two) times daily as needed for anxiety. 30 tablet 1   Ascorbic Acid (VITAMIN C GUMMIE PO) Take 1 each by mouth every morning.     aspirin EC 81 MG tablet Take 1 tablet (81 mg total) by mouth daily. 150 tablet 2   [employer] ACID CONTROLLER 10 MG tablet SMARTSIG:1 Tablet(s) By Mouth Every 12 Hours PRN     dexamethasone (DECADRON) 4 MG tablet Take 1 tablet twice a day the day before, the day of, and the day after chemotherapy. 40 tablet 2   folic acid (FOLVITE) 1 MG tablet TAKE 1 TABLET BY MOUTH EVERY DAY 90 tablet 0   loratadine (CLARITIN) 10 MG tablet Take 10 mg by mouth daily.     metFORMIN (GLUCOPHAGE) 500 MG tablet Take 500 mg by mouth 2 (two) times daily.     Multiple Vitamin (MULTIVITAMIN WITH MINERALS) TABS tablet Take 1 tablet by mouth every morning.     omeprazole (PRILOSEC) 20 MG capsule TAKE 1 CAPSULE BY MOUTH EVERY DAY 90 capsule 2   ondansetron (ZOFRAN) 8 MG tablet TAKE 1 TABLET BY MOUTH BEFORE CHEMO 30 tablet 0   OVER THE COUNTER MEDICATION Take 1 tablet by mouth every morning. (Vitamin A)     prochlorperazine (COMPAZINE) 10 MG tablet Take 1 tablet (10 mg total) by mouth every 6 (six) hours as needed for nausea or vomiting. 60 tablet 0   rosuvastatin (CRESTOR) 10 MG tablet Take 1 tablet (10 mg total) by mouth daily. 30 tablet 12   senna-docusate (SENOKOT-S) 8.6-50 MG tablet Take 1 tablet by mouth daily. 30 tablet prn   triamcinolone ointment (KENALOG) 0.5 % Apply 1 application topically 2 (two) times daily. 30 g 1   trimethoprim (TRIMPEX) 100 MG tablet Take 100 mg by mouth at bedtime.     valACYclovir (VALTREX) 1000 MG tablet Take 1 tablet (1,000 mg total) by mouth 2 (two) times daily. 14 tablet 0   Vitamin D, Ergocalciferol, (DRISDOL) 1.25 MG (50000 UNIT) CAPS capsule Take 50,000 Units by mouth once a week.     sodium chloride flush (NS) 0.9 % injection 10 mL      sodium chloride flush (NS) 0.9 % injection 10 mL      No facility-administered medications  prior to visit.    Allergies  Allergen Reactions   Codeine Nausea And Vomiting    Review of Systems  Constitutional:  Positive for appetite change, chills, fatigue and fever. Negative for activity change.  HENT:  Positive for congestion, ear pain, postnasal drip, rhinorrhea, sinus pressure, sinus pain, sneezing, sore throat and trouble swallowing.   Eyes: Negative.   Respiratory:  Positive for cough and wheezing. Negative for chest tightness and shortness of breath.   Cardiovascular:  Negative for chest  pain and palpitations.  Gastrointestinal:  Positive for diarrhea and nausea. Negative for abdominal pain, constipation and vomiting.  Endocrine: Negative for cold intolerance, heat intolerance, polydipsia and polyuria.  Genitourinary:  Negative for dyspareunia, dysuria, flank pain, frequency and urgency.  Musculoskeletal:  Negative for arthralgias, back pain and myalgias.  Skin:  Negative for rash.  Allergic/Immunologic: Negative for environmental allergies.  Neurological:  Positive for headaches. Negative for dizziness and weakness.  Hematological:  Positive for adenopathy.  Psychiatric/Behavioral:  The patient is not nervous/anxious.       Objective:    Physical Exam Vitals and nursing note reviewed.  Constitutional:      Appearance: Normal appearance. She is well-developed. She is ill-appearing.  HENT:     Head: Normocephalic and atraumatic.     Right Ear: Tympanic membrane is bulging. Tympanic membrane is not erythematous.     Left Ear: Tympanic membrane is bulging. Tympanic membrane is not erythematous.     Nose: Congestion and rhinorrhea present.     Right Sinus: Frontal sinus tenderness present. No maxillary sinus tenderness.     Left Sinus: Frontal sinus tenderness present. No maxillary sinus tenderness.     Mouth/Throat:     Pharynx: Posterior oropharyngeal erythema present.  Eyes:     Pupils: Pupils are equal, round, and reactive to light.  Cardiovascular:     Rate  and Rhythm: Normal rate and regular rhythm.     Pulses: Normal pulses.     Heart sounds: Normal heart sounds.  Pulmonary:     Effort: Pulmonary effort is normal.     Breath sounds: Normal breath sounds.     Comments: The patient has loose, congested cough which is productive during today's visit. Abdominal:     Palpations: Abdomen is soft.  Musculoskeletal:        General: Normal range of motion.     Cervical back: Normal range of motion and neck supple.  Lymphadenopathy:     Cervical: Cervical adenopathy present.  Skin:    General: Skin is warm and dry.     Capillary Refill: Capillary refill takes less than 2 seconds.  Neurological:     General: No focal deficit present.     Mental Status: She is alert and oriented to person, place, and time.  Psychiatric:        Mood and Affect: Mood normal.        Behavior: Behavior normal.        Thought Content: Thought content normal.        Judgment: Judgment normal.    Today's Vitals   08/31/21 1501  BP: 105/69  Pulse: 100  Temp: 98.4 F (36.9 C)  SpO2: 100%  Weight: 148 lb 12.8 oz (67.5 kg)  Height: _0  (1.626 m)   Body mass index is 25.54 kg/m.   Wt Readings from Last 3 Encounters:  08/31/21 148 lb 12.8 oz (67.5 kg)  08/24/21 152 lb 6 oz (69.1 kg)  08/02/21 150 lb 1.6 oz (68.1 kg)    Health Maintenance Due  Topic Date Due   COVID-19 Vaccine (1) Never done   Pneumonia Vaccine 8+ Years old (1 - PCV) Never done   FOOT EXAM  Never done   OPHTHALMOLOGY EXAM  Never done   URINE MICROALBUMIN  Never done   Hepatitis C Screening  Never done   COLONOSCOPY (Pts 45-33yr Insurance coverage will need to be confirmed)  Never done   MAMMOGRAM  Never done   DEXA SCAN  Never done    There are no preventive care reminders to display for this patient.   Lab Results  Component Value Date   WBC 5.6 08/24/2021   HGB 10.5 (L) 08/24/2021   HCT 32.1 (L) 08/24/2021   MCV 103.9 (H) 08/24/2021   PLT 369 08/24/2021   Lab Results   Component Value Date   NA 138 08/24/2021   K 4.7 08/24/2021   CHLORIDE 106 08/14/2017   CO2 26 08/24/2021   GLUCOSE 116 (H) 08/24/2021   BUN 18 08/24/2021   CREATININE 1.05 (H) 08/24/2021   BILITOT 0.3 08/24/2021   ALKPHOS 65 08/24/2021   AST 14 (L) 08/24/2021   ALT 11 08/24/2021   PROT 7.1 08/24/2021   ALBUMIN 3.8 08/24/2021   CALCIUM 9.5 08/24/2021   ANIONGAP 8 08/24/2021   EGFR >60 08/14/2017   No results found for: CHOL No results found for: HDL No results found for: [facility] Lab Results  Component Value Date   TRIG 130 11/11/2013      Assessment & Plan:  1. Acute non-recurrent pansinusitis Patient negative for both influenza and strep.  Will start Z-Pak.  Take as directed for 5 days. Rest and increase fluids. Continue using OTC medication to control symptoms.  Solu-Medrol injection, 125 mg was administered during today's visit. - azithromycin (ZITHROMAX) 250 MG tablet; z-pack - take as directed for 5 days  Dispense: 6 tablet; Refill: 0 - methylPREDNISolone sodium succinate (SOLU-MEDROL) 125 mg/2 mL injection 125 mg  2. Acute cough Patient may continue to take promethazine DM cough suppressant up to 4 times daily as needed for cough.  Did advise her that this might cause drowsiness or dizziness.  She should use with caution.  She and her husband both voiced understanding and agreement.  3. Throat soreness Patient negative for both influenza and strep.  Will treat with Z-Pak and symptom control/management. - POCT rapid strep A - POCT Influenza A/B   4. Hoarseness Patient negative for both influenza and strep.  Will treat with Z-Pak and symptom control/management. - POCT rapid strep A - POCT Influenza A/B   5. Primary cancer of right upper lobe of lung ([place]) Will get chest x-ray if patient symptoms persist or get worse over the next 24 to 48 hours.  Both she and her husband are in agreement with this plan.   Problem List Items Addressed This Visit        Respiratory   Primary cancer of right upper lobe of lung ([facility])   Relevant Medications   azithromycin (ZITHROMAX) 250 MG tablet   Acute non-recurrent pansinusitis - Primary   Relevant Medications   azithromycin (ZITHROMAX) 250 MG tablet     Other   Acute cough   Throat soreness   Relevant Orders   POCT rapid strep A (Completed)   POCT Influenza A/B (Completed)   Hoarseness   Relevant Orders   POCT rapid strep A (Completed)   POCT Influenza A/B (Completed)     Meds ordered this encounter  Medications   azithromycin (ZITHROMAX) 250 MG tablet    Sig: z-pack - take as directed for 5 days    Dispense:  6 tablet    Refill:  0    Order Specific Question:   Supervising Provider    Answer:   Beatrice Lecher D [[id number]]   methylPREDNISolone sodium succinate (SOLU-MEDROL) 125 mg/2 mL injection 125 mg     [name], NP  This note was dictated using Systems analyst.  Rapid proofreading was performed to expedite the delivery of the information. Despite proofreading, phonetic errors will occur which are common with this voice recognition software. Please take this into consideration. If there are any concerns, please contact our office.

## 2021-09-01 ENCOUNTER — Encounter: Payer: Self-pay | Admitting: Internal Medicine

## 2021-09-01 LAB — COMPREHENSIVE METABOLIC PANEL
ALT: 40 IU/L — ABNORMAL HIGH (ref 0–32)
AST: 29 IU/L (ref 0–40)
Albumin/Globulin Ratio: 1.5 (ref 1.2–2.2)
Albumin: 3.8 g/dL (ref 3.8–4.8)
Alkaline Phosphatase: 80 IU/L (ref 44–121)
BUN/Creatinine Ratio: 23 (ref 12–28)
BUN: 24 mg/dL (ref 8–27)
Bilirubin Total: 0.4 mg/dL (ref 0.0–1.2)
CO2: 21 mmol/L (ref 20–29)
Calcium: 8.9 mg/dL (ref 8.7–10.3)
Chloride: 101 mmol/L (ref 96–106)
Creatinine, Ser: 1.06 mg/dL — ABNORMAL HIGH (ref 0.57–1.00)
Globulin, Total: 2.5 g/dL (ref 1.5–4.5)
Glucose: 104 mg/dL — ABNORMAL HIGH (ref 70–99)
Potassium: 4.4 mmol/L (ref 3.5–5.2)
Sodium: 137 mmol/L (ref 134–144)
Total Protein: 6.3 g/dL (ref 6.0–8.5)
eGFR: 57 mL/min/{1.73_m2} — ABNORMAL LOW (ref >59)

## 2021-09-01 LAB — TSH: TSH: 1.71 u[IU]/mL (ref 0.450–4.500)

## 2021-09-01 LAB — LIPID PANEL
Chol/HDL Ratio: 2.7 ratio (ref 0.0–4.4)
Cholesterol, Total: 164 mg/dL (ref 100–199)
HDL: 60 mg/dL (ref >39)
LDL Chol Calc (NIH): 83 mg/dL (ref 0–99)
Triglycerides: 116 mg/dL (ref 0–149)
VLDL Cholesterol Cal: 21 mg/dL (ref 5–40)

## 2021-09-01 LAB — CBC
Hematocrit: 32.9 % — ABNORMAL LOW (ref 34.0–46.6)
Hemoglobin: 11.1 g/dL (ref 11.1–15.9)
MCH: 33.4 pg — ABNORMAL HIGH (ref 26.6–33.0)
MCHC: 33.7 g/dL (ref 31.5–35.7)
MCV: 99 fL — ABNORMAL HIGH (ref 79–97)
Platelets: 184 10*3/uL (ref 150–450)
RBC: 3.32 x10E6/uL — ABNORMAL LOW (ref 3.77–5.28)
RDW: 16 % — ABNORMAL HIGH (ref 11.7–15.4)
WBC: 1.8 10*3/uL — CL (ref 3.4–10.8)

## 2021-09-01 LAB — HEMOGLOBIN A1C
Est. average glucose Bld gHb Est-mCnc: 128 mg/dL
Hgb A1c MFr Bld: 6.1 % — ABNORMAL HIGH (ref 4.8–5.6)

## 2021-09-01 NOTE — Progress Notes (Signed)
Spoke with Abelina Bachelor, RN at medical oncology regarding patient's critically low WBC. She is going to notify Dr. Earlie Server of results and advise me of treatment plan.

## 2021-09-06 ENCOUNTER — Other Ambulatory Visit: Payer: Self-pay

## 2021-09-06 ENCOUNTER — Ambulatory Visit (INDEPENDENT_AMBULATORY_CARE_PROVIDER_SITE_OTHER): Payer: PPO | Admitting: Nurse Practitioner

## 2021-09-06 ENCOUNTER — Encounter: Payer: Self-pay | Admitting: Nurse Practitioner

## 2021-09-06 VITALS — BP 95/62 | HR 70 | Temp 97.5°F | Ht 64.0 in | Wt 151.0 lb

## 2021-09-06 DIAGNOSIS — H6122 Impacted cerumen, left ear: Secondary | ICD-10-CM | POA: Diagnosis not present

## 2021-09-06 DIAGNOSIS — E119 Type 2 diabetes mellitus without complications: Secondary | ICD-10-CM | POA: Diagnosis not present

## 2021-09-06 DIAGNOSIS — Z0000 Encounter for general adult medical examination without abnormal findings: Secondary | ICD-10-CM

## 2021-09-06 DIAGNOSIS — D72829 Elevated white blood cell count, unspecified: Secondary | ICD-10-CM

## 2021-09-06 DIAGNOSIS — C3411 Malignant neoplasm of upper lobe, right bronchus or lung: Secondary | ICD-10-CM

## 2021-09-06 DIAGNOSIS — J0140 Acute pansinusitis, unspecified: Secondary | ICD-10-CM

## 2021-09-06 LAB — PROTEIN / CREATININE RATIO, URINE: Creatinine, Urine: NORMAL

## 2021-09-06 LAB — MICROALBUMIN, URINE: Microalb, Ur: NORMAL

## 2021-09-06 LAB — MICROALBUMIN / CREATININE URINE RATIO: Microalb Creat Ratio: NORMAL

## 2021-09-06 LAB — POCT UA - MICROALBUMIN

## 2021-09-06 NOTE — Progress Notes (Signed)
Subjective:   Alexis [name] is a 71 y.o. female who presents for Medicare Annual (Subsequent) preventive examination. The patient has recently finishe antibiotics for significant sinus infection. Today, she states that she is feeling much better. Her left ear continues to hurt and feel very full of wax. She states that she is very fatigued. A check of her labs, prior to her last visit, her WBC was very low at 1.8. her oncologist was made aware of the results. He stated this was expected finding, as patient does take chronic chemotherapy and was sick at this time labs were drawn.  The patient is known diabetic. Her HgbA1c was 6.1. today, check of her urine microalbumin was normal. Her other labs were within normal limits.  She has no new concerns or complaints. She did bring an updated immunization record with her. Will update her chart today.  She does not want to get mammogram or bone density test. She states that she just has to go through so many tests right now that she just doesn't want to do this right now.  The patient did have colonoscopy done within the last 5 to 7 years. Was done per [facility]. Will need to get records fr them to review and update chart.   Review of Systems    Review of Systems  Constitutional:  Positive for malaise/fatigue. Negative for fever and weight loss.  HENT:  Positive for congestion and ear pain. Negative for ear discharge, hearing loss and sore throat.        Left ear pain with feeling of fullness   Eyes: Negative.   Respiratory:  Positive for cough, shortness of breath and wheezing.        At baseline.   Cardiovascular:  Negative for chest pain and orthopnea.  Gastrointestinal:  Negative for abdominal pain, blood in stool, constipation, diarrhea, nausea and vomiting.  Genitourinary:  Negative for dysuria, flank pain, frequency and urgency.  Musculoskeletal:  Negative for back pain and myalgias.  Skin:  Negative for itching and rash.  Neurological:   Negative for dizziness, weakness and headaches.  Psychiatric/Behavioral:  Negative for depression. The patient is nervous/anxious.        Difficulty sleeping due to anxiety some nights.          Objective:   Physical Exam Vitals and nursing note reviewed.  Constitutional:      Appearance: Normal appearance. She is well-developed.  HENT:     Head: Normocephalic and atraumatic.     Right Ear: Hearing, tympanic membrane, ear canal and external ear normal.     Left Ear: Decreased hearing noted. There is impacted cerumen.  Eyes:     Pupils: Pupils are equal, round, and reactive to light.  Neck:     Vascular: No carotid bruit.  Cardiovascular:     Rate and Rhythm: Normal rate and regular rhythm.     Pulses:          Dorsalis pedis pulses are 1+ on the right side and 1+ on the left side.       Posterior tibial pulses are 1+ on the right side and 1+ on the left side.     Heart sounds: Normal heart sounds.  Pulmonary:     Effort: Pulmonary effort is normal.     Breath sounds: Normal breath sounds.  Abdominal:     General: Bowel sounds are normal. There is no distension.     Palpations: Abdomen is soft. There is no mass.  Tenderness: There is abdominal tenderness. There is no guarding or rebound.     Hernia: No hernia is present.     Comments: Mild generalized abdominal tenderness. Vertical abdominal surgical scar present.   Musculoskeletal:        General: Normal range of motion.     Cervical back: Normal range of motion and neck supple.     Right foot: Normal range of motion. No deformity, bunion or Charcot foot.     Left foot: Normal range of motion. No deformity, bunion or Charcot foot.  Feet:     Right foot:     Protective Sensation: 10 sites tested.  10 sites sensed.     Skin integrity: Skin integrity normal.     Toenail Condition: Right toenails are normal.     Left foot:     Protective Sensation: 10 sites tested.  10 sites sensed.     Skin integrity: Skin integrity  normal.     Toenail Condition: Left toenails are normal.  Lymphadenopathy:     Cervical: No cervical adenopathy.  Skin:    General: Skin is warm and dry.     Capillary Refill: Capillary refill takes less than 2 seconds.  Neurological:     General: No focal deficit present.     Mental Status: She is alert and oriented to person, place, and time.  Psychiatric:        Mood and Affect: Mood normal.        Behavior: Behavior normal.        Thought Content: Thought content normal.        Judgment: Judgment normal.   Today's Vitals   09/06/21 0905  BP: 95/62  Pulse: 70  Temp: (!) 97.5 F (36.4 C)  SpO2: 100%  Weight: 151 lb (68.5 kg)  Height: 5\' 4"  (1.626 m)   Body mass index is 25.92 kg/m.   Advanced Directives 08/02/2021 07/12/2021 06/21/2021 05/31/2021 04/20/2021 03/30/2021 03/08/2021  Does Patient Have a Medical Advance Directive? No Yes Yes Yes Yes Yes Yes  Type of Academic librarian Living will;Healthcare Power of [place];Living will [place];Living will [place];Living will [place];Living will [place];Living will  Does patient want to make changes to medical advance directive? No - Patient declined No - Patient declined - - - No - Patient declined No - Patient declined  Copy of [place] in Chart? No - copy requested - No - copy requested No - copy requested No - copy requested No - copy requested No - copy requested    Current Medications (verified) Outpatient Encounter Medications as of 09/06/2021  Medication Sig   acetaminophen (TYLENOL) 500 MG tablet Take 500 mg by mouth every 6 (six) hours as needed for mild pain or headache. Reported on 11/02/2015   ALPRAZolam (XANAX) 1 MG tablet Take 1 tablet (1 mg total) by mouth 2 (two) times daily as needed for anxiety.   Ascorbic Acid (VITAMIN C GUMMIE PO) Take 1 each by mouth every  morning.   aspirin EC 81 MG tablet Take 1 tablet (81 mg total) by mouth daily.   azithromycin (ZITHROMAX) 250 MG tablet z-pack - take as directed for 5 days   [employer] ACID CONTROLLER 10 MG tablet SMARTSIG:1 Tablet(s) By Mouth Every 12 Hours PRN   dexamethasone (DECADRON) 4 MG tablet Take 1 tablet twice a day the day before, the day of, and  the day after chemotherapy.   folic acid (FOLVITE) 1 MG tablet TAKE 1 TABLET BY MOUTH EVERY DAY   loratadine (CLARITIN) 10 MG tablet Take 10 mg by mouth daily.   metFORMIN (GLUCOPHAGE) 500 MG tablet Take 500 mg by mouth 2 (two) times daily.   Multiple Vitamin (MULTIVITAMIN WITH MINERALS) TABS tablet Take 1 tablet by mouth every morning.   omeprazole (PRILOSEC) 20 MG capsule TAKE 1 CAPSULE BY MOUTH EVERY DAY   ondansetron (ZOFRAN) 8 MG tablet TAKE 1 TABLET BY MOUTH BEFORE CHEMO   OVER THE COUNTER MEDICATION Take 1 tablet by mouth every morning. (Vitamin A)   prochlorperazine (COMPAZINE) 10 MG tablet Take 1 tablet (10 mg total) by mouth every 6 (six) hours as needed for nausea or vomiting.   rosuvastatin (CRESTOR) 10 MG tablet Take 1 tablet (10 mg total) by mouth daily.   senna-docusate (SENOKOT-S) 8.6-50 MG tablet Take 1 tablet by mouth daily.   triamcinolone ointment (KENALOG) 0.5 % Apply 1 application topically 2 (two) times daily.   trimethoprim (TRIMPEX) 100 MG tablet Take 100 mg by mouth at bedtime.   valACYclovir (VALTREX) 1000 MG tablet Take 1 tablet (1,000 mg total) by mouth 2 (two) times daily.   Vitamin D, Ergocalciferol, (DRISDOL) 1.25 MG (50000 UNIT) CAPS capsule Take 50,000 Units by mouth once a week.   No facility-administered encounter medications on file as of 09/06/2021.    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   Past Surgical History:  Procedure Laterality Date   ABDOMINAL HYSTERECTOMY     COLOSTOMY TAKEDOWN N/A 07/10/2014   Procedure: LAPAROSCOPIC LYSIS OF ADHESIONS (90 MIN) LAPAROSCOPIC ASSISTED COLOSTOMY CLOSURE, RIGID PROCTOSIGMOIDOSCOPY;  Surgeon: Jackolyn Confer, MD;  Location: [facility];  Service: General;  Laterality: N/A;   LAPAROTOMY N/A 11/03/2013   Procedure: EXPLORATORY LAPAROTOMY, DRAINAGE OF INTRA  ABDOMINAL ABSCESSES, MOBILIZATION OF SPLENIC FLEXURE, SIGMOID COLECTOMY WITH COLOSTOMY;  Surgeon: Odis Hollingshead, MD;  Location: [facility];  Service: General;  Laterality: N/A;   VIDEO BRONCHOSCOPY Bilateral 08/30/2013   Procedure: VIDEO BRONCHOSCOPY WITH FLUORO;  Surgeon: Tanda Rockers, MD;  Location: [facility];  Service: Cardiopulmonary;  Laterality: Bilateral;   Family History  Problem Relation Age of Onset   Emphysema Father        smoked   Lung cancer Father        smoked   Cancer Mother    Hypertension Mother    COPD Mother    Social History   Socioeconomic History   Marital status: Married    Spouse name: Not on file   Number of children: Not on file   Years of education: Not on file   Highest education level: Not on file  Occupational History   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  Tobacco Use   Smoking status: Former    Packs/day: 1.00    Years: 40.00    Pack years: 40.00    Types: Cigarettes    Quit date: 09/27/2013    Years since quitting: 7.9   Smokeless tobacco: Never  Vaping Use   Vaping Use: Never used  Substance and Sexual Activity   Alcohol use: No   Drug use: No   Sexual activity: Yes  Other Topics Concern   Not on file  Social History Narrative   Not on file   Social Determinants of Health   Financial Resource Strain: Not on file  Food Insecurity: Not on file  Transportation Needs: Not on file   Physical Activity: Not on file  Stress: Not on file  Social Connections: Not on file    Tobacco Counseling Counseling given: Not Answered   Diabetic?yes    Activities of Daily Living In your present state of health, do you have Alexis difficulty performing the following activities: 08/31/2021 07/26/2021  Hearing? Y N  Vision? N N  Difficulty concentrating or making decisions? N N  Walking or climbing stairs? Y N  Dressing or bathing? N N  Doing errands, shopping? N N  Some recent data might be hidden    Patient Care Team: [name], NP as PCP - General (Family Medicine) Tanda Rockers, MD as Consulting Physician (Pulmonary Disease) [name], MD as Consulting Physician (Cardiothoracic Surgery) Curt Bears, MD as Consulting Physician (Oncology) Valrie Hart, RN as Oncology Nurse Navigator Kyung Rudd, MD as Consulting Physician (Radiation Oncology)       Assessment:  1. Encounter for Medicare annual wellness exam Annual Medicare wellness visit today.   2. Impacted cerumen of left ear Ear lavage of left ear canal.  Complete clearance of cerumen.  Patient tolerated well.  3. Diabetes mellitus without complication (HCC) Recent hemoglobin A1c was 6.1.  Urine microalbumin is normal.  Continue diabetic medications as prescribed.  4. Leukocytosis, unspecified type Recent labs did show white blood cell count 1.8.  Patient currently undergoing chemotherapy for lung cancer.  Notified patient's oncologist about results.  They will monitor closely.  5. Primary cancer of right upper lobe of lung [facility]) Patient should continue to see oncology as scheduled.  6. Acute non-recurrent pansinusitis Resolved.  Hearing/Vision screen No results found.   Depression Screen PHQ 2/9 Scores 08/31/2021 07/26/2021 01/01/2018 07/03/2017 01/09/2017  PHQ - 2 Score 0 0 0 0 0  PHQ- 9 Score 1 1 - - -    Fall Risk Fall Risk  08/31/2021 07/26/2021 01/01/2018 07/03/2017  01/09/2017  Falls in the past year? 0 0 No No No  Number falls in past yr: 0 0 - - -  Injury with Fall? 0 0 - - -  Follow up Falls evaluation completed Falls evaluation completed - - -    FALL RISK PREVENTION PERTAINING TO THE HOME:  Alexis stairs in or around the home? Yes  If so, are there Alexis without handrails? No  Home free of loose throw rugs in walkways, pet beds, electrical cords, etc? Yes  Adequate lighting in your home to reduce risk of falls? Yes   ASSISTIVE DEVICES UTILIZED TO PREVENT FALLS:  Life alert? No  Use of a cane, walker or w/c? No  Grab bars in the bathroom? Yes  Shower chair or bench in shower? Yes  Elevated toilet seat or a handicapped toilet? No   TIMED UP AND GO:  Was the test performed? Yes .  Length of time to ambulate 10 feet: 15 sec.   Gait slow and steady with assistive device  Cognitive Function:     6CIT Screen 09/06/2021  What Year? 0 points  What month? 0 points  What time? 0 points  Count back from 20 0 points  Months  in reverse 4 points  Repeat phrase 2 points  Total Score 6    Immunizations Immunization History  Administered Date(s) Administered   Fluad Quad(high Dose 65+) 06/17/2019, 06/29/2020, 07/12/2021   Influenza,inj,Quad PF,6+ Mos 05/12/2014, 06/08/2015, 05/30/2016, 05/22/2017, 05/14/2018   Influenza-Unspecified 07/19/2021   PFIZER(Purple Top)SARS-COV-2 Vaccination 10/28/2019, 11/26/2019, 06/02/2020   Pfizer Covid-19 Vaccine Bivalent Booster 87yrs & up 08/09/2021   Pneumococcal Polysaccharide-23 07/27/2019   Tdap 07/20/2019   Zoster Recombinat (Shingrix) 07/14/2019, 09/21/2019    TDAP status: Up to date  Flu Vaccine status: Up to date  Pneumococcal vaccine status: Up to date  Covid-19 vaccine status: Information provided on how to obtain vaccines.   Qualifies for Shingles Vaccine? Yes   Zostavax completed Yes   Shingrix Completed?: Yes  Screening Tests Health Maintenance  Topic Date Due   OPHTHALMOLOGY EXAM   Never done   COLONOSCOPY (Pts 45-6yrs Insurance coverage will need to be confirmed)  Never done   Pneumonia Vaccine 36+ Years old (2 - PCV) 07/26/2020   MAMMOGRAM  09/06/2022 (Originally 10/04/2001)   DEXA SCAN  09/06/2022 (Originally 10/04/2016)   Hepatitis C Screening  09/06/2022 (Originally 10/04/1968)   HEMOGLOBIN A1C  02/28/2022   FOOT EXAM  09/06/2022   URINE MICROALBUMIN  09/06/2022   TETANUS/TDAP  07/19/2029   INFLUENZA VACCINE  Completed   COVID-19 Vaccine  Completed   Zoster Vaccines- Shingrix  Completed   HPV VACCINES  Aged Out    Health Maintenance  Health Maintenance Due  Topic Date Due   OPHTHALMOLOGY EXAM  Never done   COLONOSCOPY (Pts 45-5yrs Insurance coverage will need to be confirmed)  Never done   Pneumonia Vaccine 38+ Years old (2 - PCV) 07/26/2020    Colorectal cancer screening: Type of screening: Colonoscopy. Completed 2017. Repeat every 10 years  Mammogram status: Ordered no. Pt provided with contact info and advised to call to schedule appt.   Bone Density status: Completed 2020. Results reflect: Bone density results: NORMAL. Repeat every n/a years.  Lung Cancer Screening: (Low Dose CT Chest recommended if Age 13-80 years, 30 pack-year currently smoking OR have quit w/in 15years.) does not qualify.   Lung Cancer Screening Referral: no  Additional Screening:  Hepatitis C Screening: does not qualify; Completed no  Vision Screening: Recommended annual ophthalmology exams for early detection of glaucoma and other disorders of the eye. Is the patient up to date with their annual eye exam?  Yes  Who is the provider or what is the name of the office in which the patient attends annual eye exams? [facility] If pt is not established with a provider, would they like to be referred to a provider to establish care? No .   Dental Screening: Recommended annual dental exams for proper oral hygiene  Community Resource Referral / Chronic Care Management: CRR  required this visit?  No   CCM required this visit?  No      Plan:     I have personally reviewed and noted the following in the patients chart:   Medical and social history Use of alcohol, tobacco or illicit drugs  Current medications and supplements including opioid prescriptions.  Functional ability and status Nutritional status Physical activity Advanced directives List of other physicians Hospitalizations, surgeries, and ER visits in previous 12 months Vitals Screenings to include cognitive, depression, and falls Referrals and appointments  In addition, I have reviewed and discussed with patient certain preventive protocols, quality metrics, and best practice recommendations. A written personalized care  plan for preventive services as well as general preventive health recommendations were provided to patient.     Leretha Pol, FNP-c  09/12/2021

## 2021-09-07 ENCOUNTER — Encounter: Payer: Self-pay | Admitting: Internal Medicine

## 2021-09-12 DIAGNOSIS — H6122 Impacted cerumen, left ear: Secondary | ICD-10-CM | POA: Insufficient documentation

## 2021-09-13 ENCOUNTER — Inpatient Hospital Stay: Payer: PPO

## 2021-09-13 ENCOUNTER — Encounter: Payer: Self-pay | Admitting: Internal Medicine

## 2021-09-13 ENCOUNTER — Inpatient Hospital Stay (HOSPITAL_BASED_OUTPATIENT_CLINIC_OR_DEPARTMENT_OTHER): Payer: PPO | Admitting: Internal Medicine

## 2021-09-13 ENCOUNTER — Other Ambulatory Visit: Payer: Self-pay

## 2021-09-13 ENCOUNTER — Inpatient Hospital Stay: Payer: PPO | Attending: Internal Medicine

## 2021-09-13 VITALS — BP 129/83 | HR 83 | Temp 97.5°F | Resp 19 | Ht 64.0 in | Wt 151.0 lb

## 2021-09-13 DIAGNOSIS — T451X5A Adverse effect of antineoplastic and immunosuppressive drugs, initial encounter: Secondary | ICD-10-CM | POA: Diagnosis not present

## 2021-09-13 DIAGNOSIS — Z923 Personal history of irradiation: Secondary | ICD-10-CM | POA: Insufficient documentation

## 2021-09-13 DIAGNOSIS — Z5112 Encounter for antineoplastic immunotherapy: Secondary | ICD-10-CM | POA: Insufficient documentation

## 2021-09-13 DIAGNOSIS — D6481 Anemia due to antineoplastic chemotherapy: Secondary | ICD-10-CM | POA: Diagnosis not present

## 2021-09-13 DIAGNOSIS — C3490 Malignant neoplasm of unspecified part of unspecified bronchus or lung: Secondary | ICD-10-CM | POA: Diagnosis not present

## 2021-09-13 DIAGNOSIS — C3411 Malignant neoplasm of upper lobe, right bronchus or lung: Secondary | ICD-10-CM | POA: Diagnosis not present

## 2021-09-13 DIAGNOSIS — Z79899 Other long term (current) drug therapy: Secondary | ICD-10-CM | POA: Insufficient documentation

## 2021-09-13 DIAGNOSIS — Z5111 Encounter for antineoplastic chemotherapy: Secondary | ICD-10-CM

## 2021-09-13 LAB — CBC WITH DIFFERENTIAL/PLATELET
Abs Immature Granulocytes: 0.12 10*3/uL — ABNORMAL HIGH (ref 0.00–0.07)
Basophils Absolute: 0 10*3/uL (ref 0.0–0.1)
Basophils Relative: 0 %
Eosinophils Absolute: 0 10*3/uL (ref 0.0–0.5)
Eosinophils Relative: 0 %
HCT: 34.1 % — ABNORMAL LOW (ref 36.0–46.0)
Hemoglobin: 10.9 g/dL — ABNORMAL LOW (ref 12.0–15.0)
Immature Granulocytes: 1 %
Lymphocytes Relative: 4 %
Lymphs Abs: 0.5 10*3/uL — ABNORMAL LOW (ref 0.7–4.0)
MCH: 33.6 pg (ref 26.0–34.0)
MCHC: 32 g/dL (ref 30.0–36.0)
MCV: 105.2 fL — ABNORMAL HIGH (ref 80.0–100.0)
Monocytes Absolute: 0.6 10*3/uL (ref 0.1–1.0)
Monocytes Relative: 5 %
Neutro Abs: 10.2 10*3/uL — ABNORMAL HIGH (ref 1.7–7.7)
Neutrophils Relative %: 90 %
Platelets: 311 10*3/uL (ref 150–400)
RBC: 3.24 MIL/uL — ABNORMAL LOW (ref 3.87–5.11)
RDW: 18.3 % — ABNORMAL HIGH (ref 11.5–15.5)
WBC: 11.3 10*3/uL — ABNORMAL HIGH (ref 4.0–10.5)
nRBC: 0 % (ref 0.0–0.2)

## 2021-09-13 LAB — COMPREHENSIVE METABOLIC PANEL
ALT: 14 U/L (ref 0–44)
AST: 10 U/L — ABNORMAL LOW (ref 15–41)
Albumin: 3.9 g/dL (ref 3.5–5.0)
Alkaline Phosphatase: 62 U/L (ref 38–126)
Anion gap: 8 (ref 5–15)
BUN: 21 mg/dL (ref 8–23)
CO2: 25 mmol/L (ref 22–32)
Calcium: 9.8 mg/dL (ref 8.9–10.3)
Chloride: 105 mmol/L (ref 98–111)
Creatinine, Ser: 0.87 mg/dL (ref 0.44–1.00)
GFR, Estimated: >60 mL/min (ref >60)
Glucose, Bld: 127 mg/dL — ABNORMAL HIGH (ref 70–99)
Potassium: 4.5 mmol/L (ref 3.5–5.1)
Sodium: 138 mmol/L (ref 135–145)
Total Bilirubin: 0.4 mg/dL (ref 0.3–1.2)
Total Protein: 7.1 g/dL (ref 6.5–8.1)

## 2021-09-13 MED ORDER — SODIUM CHLORIDE 0.9 % IV SOLN: Freq: Once | INTRAVENOUS | Status: AC

## 2021-09-13 MED ORDER — CYANOCOBALAMIN 1000 MCG/ML IJ SOLN
1000.0000 ug | Freq: Once | INTRAMUSCULAR | Status: AC
  Administered 2021-09-13: 1000 ug via INTRAMUSCULAR
  Filled 2021-09-13: qty 1

## 2021-09-13 MED ORDER — SODIUM CHLORIDE 0.9 % IV SOLN
10.0000 mg | Freq: Once | INTRAVENOUS | Status: AC
  Administered 2021-09-13: 10 mg via INTRAVENOUS
  Filled 2021-09-13: qty 10

## 2021-09-13 MED ORDER — SODIUM CHLORIDE 0.9 % IV SOLN
500.0000 mg/m2 | Freq: Once | INTRAVENOUS | Status: AC
  Administered 2021-09-13: 900 mg via INTRAVENOUS
  Filled 2021-09-13: qty 20

## 2021-09-13 NOTE — Progress Notes (Signed)
°    [facility] °[phone]   [phone] ° °OFFICE PROGRESS NOTE ° °Boscia, Heather E, NP °[address] °[place] [address] ° °DIAGNOSIS: Stage IV (T2a, N0, M1b) non-small cell lung cancer, adenocarcinoma with negative EGFR and ALK mutations diagnosed in January 2015 and presented with right upper lobe lung mass in addition to a solitary brain metastasis. ° °PRIOR THERAPY: °1) Status post stereotactic radiotherapy to a solitary right parietal brain lesion under the care of Dr. Moody on 10/16/2013. °2) Status post palliative radiotherapy to the right lung tumor under the care of Dr. Moody completed on 12/05/2013. °3) Systemic chemotherapy with carboplatin for AUC of 5 and Alimta 500 mg/M2 every 3 weeks. First dose Jan 06 2014. Status post 6 cycles. ° °CURRENT THERAPY: Systemic chemotherapy with maintenance Alimta 500 MG/M2 every 3 weeks, status post 125 cycles. ° °INTERVAL HISTORY: °Alexis [name] 71 y.o. female returns to the clinic today for follow-up visit.  The patient is feeling fine today with no concerning complaints.  She denied having any fatigue or weakness.  She denied having any nausea, vomiting, diarrhea or constipation.  She has no headache or visual changes.  She denied having any significant weight loss or night sweats.  She has no chest pain, shortness of breath, cough or hemoptysis.  She is here today for evaluation before starting cycle #126. ° ° °MEDICAL HISTORY: °Past Medical History:  °Diagnosis Date  ° Anxiety   ° Anxiety 06/20/2016  ° Cervical cancer (HCC)   ° Diabetes mellitus without complication (HCC)   ° patient states she has type 2  ° Encounter for antineoplastic chemotherapy 07/20/2015  ° Malignant neoplasm of right upper lobe of lung (HCC)   °  non small cell lung cancer adenocarcioma with brain meta  ° ° °ALLERGIES:  is allergic to codeine. ° °MEDICATIONS:  °Current Outpatient Medications  °Medication Sig Dispense Refill  °  acetaminophen (TYLENOL) 500 MG tablet Take 500 mg by mouth every 6 (six) hours as needed for mild pain or headache. Reported on 11/02/2015    ° ALPRAZolam (XANAX) 1 MG tablet Take 1 tablet (1 mg total) by mouth 2 (two) times daily as needed for anxiety. 30 tablet 1  ° Ascorbic Acid (VITAMIN C GUMMIE PO) Take 1 each by mouth every morning.    ° aspirin EC 81 MG tablet Take 1 tablet (81 mg total) by mouth daily. 150 tablet 2  ° azithromycin (ZITHROMAX) 250 MG tablet z-pack - take as directed for 5 days 6 tablet 0  ° [employer] ACID CONTROLLER 10 MG tablet SMARTSIG:1 Tablet(s) By Mouth Every 12 Hours PRN    ° dexamethasone (DECADRON) 4 MG tablet Take 1 tablet twice a day the day before, the day of, and the day after chemotherapy. 40 tablet 2  ° folic acid (FOLVITE) 1 MG tablet TAKE 1 TABLET BY MOUTH EVERY DAY 90 tablet 0  ° loratadine (CLARITIN) 10 MG tablet Take 10 mg by mouth daily.    ° metFORMIN (GLUCOPHAGE) 500 MG tablet Take 500 mg by mouth 2 (two) times daily.    ° Multiple Vitamin (MULTIVITAMIN WITH MINERALS) TABS tablet Take 1 tablet by mouth every morning.    ° omeprazole (PRILOSEC) 20 MG capsule TAKE 1 CAPSULE BY MOUTH EVERY DAY 90 capsule 2  ° ondansetron (ZOFRAN) 8 MG tablet TAKE 1 TABLET BY MOUTH BEFORE CHEMO 30 tablet 0  ° OVER THE COUNTER MEDICATION Take 1 tablet by mouth   every morning. (Vitamin A)    ° prochlorperazine (COMPAZINE) 10 MG tablet Take 1 tablet (10 mg total) by mouth every 6 (six) hours as needed for nausea or vomiting. 60 tablet 0  ° rosuvastatin (CRESTOR) 10 MG tablet Take 1 tablet (10 mg total) by mouth daily. 30 tablet 12  ° senna-docusate (SENOKOT-S) 8.6-50 MG tablet Take 1 tablet by mouth daily. 30 tablet prn  ° triamcinolone ointment (KENALOG) 0.5 % Apply 1 application topically 2 (two) times daily. 30 g 1  ° trimethoprim (TRIMPEX) 100 MG tablet Take 100 mg by mouth at bedtime.    ° valACYclovir (VALTREX) 1000 MG tablet Take 1 tablet (1,000 mg total) by mouth 2 (two) times daily. 14 tablet 0   ° Vitamin D, Ergocalciferol, (DRISDOL) 1.25 MG (50000 UNIT) CAPS capsule Take 50,000 Units by mouth once a week.    ° °No current facility-administered medications for this visit.  ° ° °SURGICAL HISTORY:  °Past Surgical History:  °Procedure Laterality Date  ° ABDOMINAL HYSTERECTOMY    ° COLOSTOMY TAKEDOWN N/A 07/10/2014  ° Procedure: LAPAROSCOPIC LYSIS OF ADHESIONS (90 MIN) LAPAROSCOPIC ASSISTED COLOSTOMY CLOSURE, RIGID PROCTOSIGMOIDOSCOPY;  Surgeon: Todd Rosenbower, MD;  Location: [facility];  Service: General;  Laterality: N/A;  ° LAPAROTOMY N/A 11/03/2013  ° Procedure: EXPLORATORY LAPAROTOMY, DRAINAGE OF INTRA  ABDOMINAL ABSCESSES, MOBILIZATION OF SPLENIC FLEXURE, SIGMOID COLECTOMY WITH COLOSTOMY;  Surgeon: Todd J Rosenbower, MD;  Location: [facility];  Service: General;  Laterality: N/A;  ° VIDEO BRONCHOSCOPY Bilateral 08/30/2013  ° Procedure: VIDEO BRONCHOSCOPY WITH FLUORO;  Surgeon: Michael B Wert, MD;  Location: [facility];  Service: Cardiopulmonary;  Laterality: Bilateral;  ° ° °REVIEW OF SYSTEMS:  A comprehensive review of systems was negative.  ° °PHYSICAL EXAMINATION: General appearance: alert, cooperative, and no distress °Head: Normocephalic, without obvious abnormality, atraumatic °Neck: no adenopathy, no JVD, supple, symmetrical, trachea midline, and thyroid not enlarged, symmetric, no tenderness/mass/nodules °Lymph nodes: Cervical, supraclavicular, and axillary nodes normal. °Resp: clear to auscultation bilaterally °Back: symmetric, no curvature. ROM normal. No CVA tenderness. °Cardio: regular rate and rhythm, S1, S2 normal, no murmur, click, rub or gallop °GI: soft, non-tender; bowel sounds normal; no masses,  no organomegaly °Extremities: extremities normal, atraumatic, no cyanosis or edema  ° °ECOG PERFORMANCE STATUS: 1 - Symptomatic but completely ambulatory  ° °Blood pressure 129/83, pulse 83, temperature (!) 97.5 °F (36.4 °C), temperature source Tympanic, resp. rate 19, height 5' 4" (1.626 m), weight  151 lb (68.5 kg), SpO2 100 %. ° °LABORATORY DATA: °Lab Results  °Component Value Date  ° WBC 11.3 (H) 09/13/2021  ° HGB 10.9 (L) 09/13/2021  ° HCT 34.1 (L) 09/13/2021  ° MCV 105.2 (H) 09/13/2021  ° PLT 311 09/13/2021  ° ° °  Chemistry   °   °Component Value Date/Time  ° NA 137 08/31/2021 0900  ° NA 139 08/14/2017 0837  ° K 4.4 08/31/2021 0900  ° K 4.4 08/14/2017 0837  ° CL 101 08/31/2021 0900  ° CO2 21 08/31/2021 0900  ° CO2 24 08/14/2017 0837  ° BUN 24 08/31/2021 0900  ° BUN 13.3 08/14/2017 0837  ° CREATININE 1.06 (H) 08/31/2021 0900  ° CREATININE 1.24 (H) 08/02/2021 0817  ° CREATININE 0.8 08/14/2017 0837  °    °Component Value Date/Time  ° CALCIUM 8.9 08/31/2021 0900  ° CALCIUM 9.3 08/14/2017 0837  ° ALKPHOS 80 08/31/2021 0900  ° ALKPHOS 107 08/14/2017 0837  ° AST 29 08/31/2021 0900  ° AST 12 (L) 08/02/2021 0817  °   AST 12 08/14/2017 0837   ALT 40 (H) 08/31/2021 0900   ALT 11 08/02/2021 0817   ALT 12 08/14/2017 0837   BILITOT 0.4 08/31/2021 0900   BILITOT 0.2 (L) 08/02/2021 0817   BILITOT 0.37 08/14/2017 0837       RADIOGRAPHIC STUDIES: No results found.  ASSESSMENT AND PLAN:  This is a very pleasant 71 years old white female with metastatic non-small cell lung cancer, adenocarcinoma status post induction systemic chemotherapy with carboplatin and Alimta with partial response. The patient is currently on maintenance treatment with single agent Alimta status post 125 cycles. She continues to tolerate this treatment well with no concerning adverse effects. I recommended for her to proceed with cycle #126 today as planned. I will see her back for follow-up visit in 3 weeks for evaluation with repeat CT scan of the chest, abdomen and pelvis for restaging of her disease. For the urinary tract infection she is currently on trimethoprim 100 mg p.o. every night. For the anxiety she is currently on Xanax. The patient was advised to call immediately if she has any other concerning symptoms in the  interval. The patient voices understanding of current disease status and treatment options and is in agreement with the current care plan. All questions were answered. The patient knows to call the clinic with any problems, questions or concerns. We can certainly see the patient much sooner if necessary.  Disclaimer: This note was dictated with voice recognition software. Similar sounding words can inadvertently be transcribed and may not be corrected upon review.

## 2021-09-13 NOTE — Progress Notes (Signed)
Ok to treat without an ALT result today per Dr Julien Nordmann.

## 2021-09-13 NOTE — Patient Instructions (Signed)
[facility]   Discharge Instructions: Thank you for choosing [facility] to provide your oncology and hematology care.   If you have a lab appointment with the [facility], please go directly to the [facility] and check in at the registration area.   Wear comfortable clothing and clothing appropriate for easy access to any Portacath or PICC line.   We strive to give you quality time with your provider. You may need to reschedule your appointment if you arrive late (15 or more minutes).  Arriving late affects you and other patients whose appointments are after yours.  Also, if you miss three or more appointments without notifying the office, you may be dismissed from the clinic at the providers discretion.      For prescription refill requests, have your pharmacy contact our office and allow 72 hours for refills to be completed.    Today you received the following chemotherapy and/or immunotherapy agents: pemetrexed      To help prevent nausea and vomiting after your treatment, we encourage you to take your nausea medication as directed.  BELOW ARE SYMPTOMS THAT SHOULD BE REPORTED IMMEDIATELY: *FEVER GREATER THAN 100.4 F (38 C) OR HIGHER *CHILLS OR SWEATING *NAUSEA AND VOMITING THAT IS NOT CONTROLLED WITH YOUR NAUSEA MEDICATION *UNUSUAL SHORTNESS OF BREATH *UNUSUAL BRUISING OR BLEEDING *URINARY PROBLEMS (pain or burning when urinating, or frequent urination) *BOWEL PROBLEMS (unusual diarrhea, constipation, pain near the anus) TENDERNESS IN MOUTH AND THROAT WITH OR WITHOUT PRESENCE OF ULCERS (sore throat, sores in mouth, or a toothache) UNUSUAL RASH, SWELLING OR PAIN  UNUSUAL VAGINAL DISCHARGE OR ITCHING   Items with * indicate a potential emergency and should be followed up as soon as possible or go to the Emergency Department if any problems should occur.  Please show the CHEMOTHERAPY ALERT CARD or IMMUNOTHERAPY ALERT CARD at check-in  to the Emergency Department and triage nurse.  Should you have questions after your visit or need to cancel or reschedule your appointment, please contact [facility]  Dept: [phone]  and follow the prompts.  Office hours are 8:00 a.m. to 4:30 p.m. Monday - Friday. Please note that voicemails left after 4:00 p.m. may not be returned until the following business day.  We are closed weekends and major holidays. You have access to a nurse at all times for urgent questions. Please call the main number to the clinic Dept: [phone] and follow the prompts.   For any non-urgent questions, you may also contact your provider using MyChart. We now offer e-Visits for anyone 69 and older to request care online for non-urgent symptoms. For details visit [email or web address].   Also download the MyChart app! Go to the app store, search "MyChart", open the app, select [facility], and log in with your MyChart username and password.  Due to Covid, a mask is required upon entering the hospital/clinic. If you do not have a mask, one will be given to you upon arrival. For doctor visits, patients may have 1 support person aged 59 or older with them. For treatment visits, patients cannot have anyone with them due to current Covid guidelines and our immunocompromised population.

## 2021-09-22 NOTE — Progress Notes (Signed)
[facility] OFFICE PROGRESS NOTE  [name], NP [place] [address]  DIAGNOSIS: Stage IV (T2a, N0, M1b) non-small cell lung cancer, adenocarcinoma with negative EGFR and ALK mutations diagnosed in January 2015 and presented with right upper lobe lung mass in addition to a solitary brain metastasis  PRIOR THERAPY: 1) Status post stereotactic radiotherapy to a solitary right parietal brain lesion under the care of Dr. [name] on 10/16/2013. 2) Status post palliative radiotherapy to the right lung tumor under the care of Dr. [name] completed on 12/05/2013. 3) Systemic chemotherapy with carboplatin for AUC of 5 and Alimta 500 mg/M2 every 3 weeks. First dose Jan 06 2014. Status post 6 cycles.  CURRENT THERAPY: Systemic chemotherapy with maintenance Alimta 500 MG/M2 every 3 weeks, status post 126 cycles.  INTERVAL HISTORY: Alexis [name] 71 y.o. female returns to the clinic today for a follow up visit. The patient is feeling good today without any concerning complaints.  The patient continues to tolerate treatment with single agent Alimta well without any adverse effects. Denies any fevers or chills. She denies night sweats. Denies any chest pain, cough, or hemoptysis. Denies significant or worsening shortness of breath except mild shortness of breath with exertion. Denies any nausea, vomiting, constipation, or diarrhea. Denies any headache or visual changes. She recently had a restaging CT scan performed.  She is here today for evaluation to review her scan results before starting cycle 127.    MEDICAL HISTORY: Past Medical History:  Diagnosis Date   Anxiety    Anxiety 06/20/2016   Cervical cancer ([facility])    Diabetes mellitus without complication ([facility])    patient states she has type 2   Encounter for antineoplastic chemotherapy 07/20/2015   Malignant neoplasm of right upper lobe of lung (HCC)     non small cell lung cancer adenocarcioma with brain  meta    ALLERGIES:  is allergic to codeine.  MEDICATIONS:  Current Outpatient Medications  Medication Sig Dispense Refill   acetaminophen (TYLENOL) 500 MG tablet Take 500 mg by mouth every 6 (six) hours as needed for mild pain or headache. Reported on 11/02/2015     ALPRAZolam (XANAX) 1 MG tablet Take 1 tablet (1 mg total) by mouth 2 (two) times daily as needed for anxiety. 30 tablet 1   Ascorbic Acid (VITAMIN C GUMMIE PO) Take 1 each by mouth every morning.     aspirin EC 81 MG tablet Take 1 tablet (81 mg total) by mouth daily. 150 tablet 2   [employer] ACID CONTROLLER 10 MG tablet SMARTSIG:1 Tablet(s) By Mouth Every 12 Hours PRN     dexamethasone (DECADRON) 4 MG tablet Take 1 tablet twice a day the day before, the day of, and the day after chemotherapy. 40 tablet 2   folic acid (FOLVITE) 1 MG tablet TAKE 1 TABLET BY MOUTH EVERY DAY 90 tablet 0   loratadine (CLARITIN) 10 MG tablet Take 10 mg by mouth daily.     metFORMIN (GLUCOPHAGE) 500 MG tablet Take 500 mg by mouth 2 (two) times daily.     Multiple Vitamin (MULTIVITAMIN WITH MINERALS) TABS tablet Take 1 tablet by mouth every morning.     omeprazole (PRILOSEC) 20 MG capsule Take 1 capsule (20 mg total) by mouth daily. 30 capsule 2   ondansetron (ZOFRAN) 8 MG tablet TAKE 1 TABLET BY MOUTH BEFORE CHEMO 30 tablet 0   OVER THE COUNTER MEDICATION Take 1 tablet by mouth every morning. (  Vitamin A)     prochlorperazine (COMPAZINE) 10 MG tablet Take 1 tablet (10 mg total) by mouth every 6 (six) hours as needed for nausea or vomiting. 60 tablet 0   rosuvastatin (CRESTOR) 10 MG tablet Take 1 tablet (10 mg total) by mouth daily. 30 tablet 12   senna-docusate (SENOKOT-S) 8.6-50 MG tablet Take 1 tablet by mouth daily. 30 tablet prn   triamcinolone ointment (KENALOG) 0.5 % Apply 1 application topically 2 (two) times daily. 30 g 1   trimethoprim (TRIMPEX) 100 MG tablet Take 100 mg by mouth at bedtime.     valACYclovir (VALTREX) 1000 MG tablet Take 1 tablet  (1,000 mg total) by mouth 2 (two) times daily. 14 tablet 0   Vitamin D, Ergocalciferol, (DRISDOL) 1.25 MG (50000 UNIT) CAPS capsule Take 50,000 Units by mouth once a week.     No current facility-administered medications for this visit.    SURGICAL HISTORY:  Past Surgical History:  Procedure Laterality Date   ABDOMINAL HYSTERECTOMY     COLOSTOMY TAKEDOWN N/A 07/10/2014   Procedure: LAPAROSCOPIC LYSIS OF ADHESIONS (90 MIN) LAPAROSCOPIC ASSISTED COLOSTOMY CLOSURE, RIGID PROCTOSIGMOIDOSCOPY;  Surgeon: Jackolyn Confer, MD;  Location: [facility];  Service: General;  Laterality: N/A;   LAPAROTOMY N/A 11/03/2013   Procedure: EXPLORATORY LAPAROTOMY, DRAINAGE OF INTRA  ABDOMINAL ABSCESSES, MOBILIZATION OF SPLENIC FLEXURE, SIGMOID COLECTOMY WITH COLOSTOMY;  Surgeon: Odis Hollingshead, MD;  Location: [facility];  Service: General;  Laterality: N/A;   VIDEO BRONCHOSCOPY Bilateral 08/30/2013   Procedure: VIDEO BRONCHOSCOPY WITH FLUORO;  Surgeon: Tanda Rockers, MD;  Location: [facility];  Service: Cardiopulmonary;  Laterality: Bilateral;    REVIEW OF SYSTEMS:   Review of Systems  Constitutional: Negative for appetite change, chills, fatigue, fever and unexpected weight change.  HENT:   Negative for mouth sores, nosebleeds, sore throat and trouble swallowing.   Eyes: Negative for eye problems and icterus.  Respiratory: Negative for cough, hemoptysis, shortness of breath and wheezing.   Cardiovascular: Negative for chest pain and leg swelling.  Gastrointestinal: Negative for abdominal pain, constipation, diarrhea, nausea and vomiting.  Genitourinary: Negative for bladder incontinence, difficulty urinating, dysuria, frequency and hematuria.   Musculoskeletal: Negative for back pain, gait problem, neck pain and neck stiffness.  Skin: Negative for itching and rash.  Neurological: Negative for dizziness, extremity weakness, gait problem, headaches, light-headedness and seizures.  Hematological: Negative for  adenopathy. Does not bruise/bleed easily.  Psychiatric/Behavioral: Negative for confusion, depression and sleep disturbance. The patient is not nervous/anxious.     PHYSICAL EXAMINATION:  Blood pressure 126/75, pulse 96, temperature 97.8 F (36.6 C), temperature source Tympanic, resp. rate 19, height 5' 4"  (1.626 m), weight 152 lb 4.8 oz (69.1 kg), SpO2 100 %.  ECOG PERFORMANCE STATUS: 1 - Symptomatic but completely ambulatory  Physical Exam  Constitutional: Oriented to person, place, and time and well-developed, well-nourished, and in no distress. No distress.  HENT:  Head: Normocephalic and atraumatic.  Mouth/Throat: Oropharynx is clear and moist. No oropharyngeal exudate.  Eyes: Conjunctivae are normal. Right eye exhibits no discharge. Left eye exhibits no discharge. No scleral icterus.  Neck: Normal range of motion. Neck supple.  Cardiovascular: Normal rate, regular rhythm, normal heart sounds and intact distal pulses.   Pulmonary/Chest: Effort normal and breath sounds normal. No respiratory distress. No wheezes. No rales.  Abdominal: Soft. Bowel sounds are normal. Exhibits no distension and no mass. There is no tenderness.  Musculoskeletal: Normal range of motion. Exhibits no edema.  Lymphadenopathy:    No  cervical adenopathy.  Neurological: Alert and oriented to person, place, and time. Exhibits normal muscle tone. Gait normal. Coordination normal.  Skin: Skin is warm and dry. No rash noted. Not diaphoretic. No erythema. No pallor.  Psychiatric: Mood, memory and judgment normal.  Vitals reviewed.  LABORATORY DATA: Lab Results  Component Value Date   WBC 9.8 10/04/2021   HGB 11.4 (L) 10/04/2021   HCT 34.9 (L) 10/04/2021   MCV 106.4 (H) 10/04/2021   PLT 254 10/04/2021      Chemistry      Component Value Date/Time   NA 138 10/04/2021 0848   NA 137 08/31/2021 0900   NA 139 08/14/2017 0837   K 4.1 10/04/2021 0848   K 4.4 08/14/2017 0837   CL 104 10/04/2021 0848   CO2  25 10/04/2021 0848   CO2 24 08/14/2017 0837   BUN 21 10/04/2021 0848   BUN 24 08/31/2021 0900   BUN 13.3 08/14/2017 0837   CREATININE 0.83 10/04/2021 0848   CREATININE 0.8 08/14/2017 0837      Component Value Date/Time   CALCIUM 9.8 10/04/2021 0848   CALCIUM 9.3 08/14/2017 0837   ALKPHOS 67 10/04/2021 0848   ALKPHOS 107 08/14/2017 0837   AST 10 (L) 10/04/2021 0848   AST 12 08/14/2017 0837   ALT 12 10/04/2021 0848   ALT 12 08/14/2017 0837   BILITOT 0.4 10/04/2021 0848   BILITOT 0.37 08/14/2017 0837       RADIOGRAPHIC STUDIES:  CT CHEST ABDOMEN PELVIS W CONTRAST  Result Date: 10/04/2021 CLINICAL DATA:  History of non-small cell lung cancer, restaging. EXAM: CT CHEST, ABDOMEN, AND PELVIS WITH CONTRAST TECHNIQUE: Multidetector CT imaging of the chest, abdomen and pelvis was performed following the standard protocol during bolus administration of intravenous contrast. RADIATION DOSE REDUCTION: This exam was performed according to the departmental dose-optimization program which includes automated exposure control, adjustment of the mA and/or kV according to patient size and/or use of iterative reconstruction technique. CONTRAST:  162m ISOVUE-300 IOPAMIDOL (ISOVUE-300) INJECTION 61% COMPARISON:  Multiple priors including most recent July 09, 2021 FINDINGS: CT CHEST FINDINGS Cardiovascular: Aortic atherosclerosis without aneurysmal dilation. No central pulmonary embolus on this nondedicated study. Normal size heart. No significant pericardial effusion/thickening. Mediastinum/Nodes: No supraclavicular adenopathy. No discrete thyroid nodule. No pathologically enlarged mediastinal, hilar or axillary lymph nodes. The trachea and esophagus are unremarkable. Lungs/Pleura: Stable post treatment scarring in the medial right upper lobe extending into the right apex. Apical pleuroparenchymal scarring appears similar prior. Unchanged size of the 4 mm ground-glass nodule in the right lower lobe on image  82/6 and 6 mm ground-glass nodule in the right lower lobe on image 95/6. Additional tiny scattered pulmonary nodules are unchanged from prior including a 2 mm left lower lobe pulmonary nodule on image 101/6 and a 2 mm right lower lobe pulmonary nodule on image 98/6. No new suspicious pulmonary nodules or masses. Centrilobular and paraseptal emphysema. No pleural effusion or pneumothorax. Musculoskeletal: No chest wall mass or suspicious bone lesions identified. CT ABDOMEN PELVIS FINDINGS Hepatobiliary: No suspicious hepatic lesion. No gallstones, gallbladder wall thickening, or biliary dilatation. Pancreas: No pancreatic ductal dilatation or surrounding inflammatory changes. Spleen: Normal in size without focal abnormality. Adrenals/Urinary Tract: Bilateral adrenal glands appear normal. No hydronephrosis. 1 cm left upper pole renal cyst. Kidneys demonstrate symmetric enhancement excretion of contrast. No solid enhancing renal mass. Urinary bladder is unremarkable for degree of distension. Stomach/Bowel: Radiopaque enteric contrast material traverses the rectum. Stomach is minimally distended without suspicious wall thickening.  No pathologic dilation of small or large bowel. The appendix and terminal ileum appear normal. No suspicious colonic wall thickening or mass like lesions. Colonic anastomosis visualized in the left paramedian pelvis on image 99/2. Vascular/Lymphatic: Aortic atherosclerosis without abdominal aortic aneurysm. No pathologically enlarged abdominal or pelvic lymph nodes. Reproductive: Status post hysterectomy. No adnexal masses. Other: No significant abdominopelvic free fluid. No pneumoperitoneum. Musculoskeletal: No aggressive lytic or blastic lesion of bone. IMPRESSION: 1. Stable examination without new or progressive findings to suggest recurrent or metastatic disease in the chest abdomen or pelvis. 2. Stable post treatment scarring in the medial right upper lobe extending into the right apex.  3. Stable scattered small bilateral pulmonary nodules without new suspicious pulmonary nodules or masses. 4. Aortic Atherosclerosis (ICD10-I70.0) and Emphysema (ICD10-J43.9). Electronically Signed   By: Dahlia Bailiff M.D.   On: 10/04/2021 09:34     ASSESSMENT/PLAN:  This is a very pleasant 71 year old Caucasian female with stage IV non-small cell lung cancer, adenocarcinoma who presented with a right upper lobe lung mass in addition to a solitary brain metastatsis.  She was diagnosed in January 2015.  The patient had completed induction systemic chemotherapy with carboplatin and Alimta with a partial response. The patient is currently being treated with single agent maintenance Alimta.  She is status post 126 cycles.    The patient recently had a restaging CT scan performed.  Dr. Julien Nordmann personally and independently reviewed the scan and discussed the results with the patient today. The scan showed no evidence for disease progression.   Dr. Julien Nordmann recommends that the patient proceed with cycle #127 today scheduled.    We will see her back for follow-up visit in 3 weeks for evaluation before starting cycle #128.   I have refilled her prilosec   The patient was advised to call immediately if she has any concerning symptoms in the interval. The patient voices understanding of current disease status and treatment options and is in agreement with the current care plan. All questions were answered. The patient knows to call the clinic with any problems, questions or concerns. We can certainly see the patient much sooner if necessary   No orders of the defined types were placed in this encounter.     Nykira Reddix L Keerthana Vanrossum, PA-C 10/04/21  ADDENDUM: Hematology/Oncology Attending: I had a face-to-face encounter with the patient today.  I reviewed her record, lab, scan and recommended her care plan.  This is a very pleasant 71 years old white female who was celebrating her birthday today.   The patient has a history of stage IV non-small cell lung cancer, adenocarcinoma diagnosed in January 2015 status post induction systemic chemotherapy with carboplatin and Alimta with partial response.  She has been on maintenance treatment with Alimta status post 126 cycles.  The patient has been tolerating this treatment well with no concerning adverse effects except for mild fatigue.  She is here today for evaluation with repeat CT scan of the chest, abdomen pelvis performed recently.  I personally and independently reviewed the scans and discussed the result with the patient today. Her scan showed no concerning findings for disease progression. I recommended for the patient to continue her current treatment with maintenance Alimta and she will proceed with cycle #127 today as planned. She will come back for follow-up visit in 3 weeks for evaluation before the next cycle of her treatment. For the acid reflux, we gave the patient refill of Prilosec today. The patient was advised to call immediately if  she has any other concerning symptoms in the interval. The total time spent in the appointment was 30 minutes.  Disclaimer: This note was dictated with voice recognition software. Similar sounding words can inadvertently be transcribed and may be missed upon review. Eilleen Kempf, MD 10/04/21

## 2021-10-01 ENCOUNTER — Ambulatory Visit: Admission: RE | Admit: 2021-10-01 | Payer: PPO | Source: Ambulatory Visit

## 2021-10-01 ENCOUNTER — Other Ambulatory Visit: Payer: Self-pay | Admitting: Internal Medicine

## 2021-10-01 ENCOUNTER — Ambulatory Visit
Admission: RE | Admit: 2021-10-01 | Discharge: 2021-10-01 | Disposition: A | Discharge status: Home / Self Care | Payer: PPO | Source: Ambulatory Visit | Attending: Internal Medicine | Admitting: Internal Medicine

## 2021-10-01 DIAGNOSIS — C3490 Malignant neoplasm of unspecified part of unspecified bronchus or lung: Secondary | ICD-10-CM

## 2021-10-01 MED ORDER — IOPAMIDOL (ISOVUE-300) INJECTION 61%
100.0000 mL | Freq: Once | INTRAVENOUS | Status: AC | PRN
  Administered 2021-10-01: 100 mL via INTRAVENOUS

## 2021-10-01 MED FILL — Dexamethasone Sodium Phosphate Inj 100 MG/10ML: INTRAMUSCULAR | Qty: 1 | Status: AC

## 2021-10-04 ENCOUNTER — Inpatient Hospital Stay: Payer: PPO | Attending: Internal Medicine

## 2021-10-04 ENCOUNTER — Inpatient Hospital Stay: Payer: PPO

## 2021-10-04 ENCOUNTER — Other Ambulatory Visit: Payer: Self-pay

## 2021-10-04 ENCOUNTER — Inpatient Hospital Stay (HOSPITAL_BASED_OUTPATIENT_CLINIC_OR_DEPARTMENT_OTHER): Payer: PPO | Admitting: Physician Assistant

## 2021-10-04 ENCOUNTER — Encounter: Payer: Self-pay | Admitting: Internal Medicine

## 2021-10-04 VITALS — BP 126/75 | HR 96 | Temp 97.8°F | Resp 19 | Ht 64.0 in | Wt 152.3 lb

## 2021-10-04 DIAGNOSIS — Z5111 Encounter for antineoplastic chemotherapy: Secondary | ICD-10-CM | POA: Diagnosis not present

## 2021-10-04 DIAGNOSIS — C3411 Malignant neoplasm of upper lobe, right bronchus or lung: Secondary | ICD-10-CM

## 2021-10-04 DIAGNOSIS — Z923 Personal history of irradiation: Secondary | ICD-10-CM | POA: Insufficient documentation

## 2021-10-04 DIAGNOSIS — C7931 Secondary malignant neoplasm of brain: Secondary | ICD-10-CM | POA: Diagnosis not present

## 2021-10-04 DIAGNOSIS — Z5112 Encounter for antineoplastic immunotherapy: Secondary | ICD-10-CM | POA: Insufficient documentation

## 2021-10-04 DIAGNOSIS — Z9221 Personal history of antineoplastic chemotherapy: Secondary | ICD-10-CM | POA: Insufficient documentation

## 2021-10-04 DIAGNOSIS — F419 Anxiety disorder, unspecified: Secondary | ICD-10-CM | POA: Diagnosis not present

## 2021-10-04 DIAGNOSIS — N390 Urinary tract infection, site not specified: Secondary | ICD-10-CM

## 2021-10-04 DIAGNOSIS — Z79899 Other long term (current) drug therapy: Secondary | ICD-10-CM | POA: Diagnosis not present

## 2021-10-04 LAB — CBC WITH DIFFERENTIAL (CANCER CENTER ONLY)
Abs Immature Granulocytes: 0.07 10*3/uL (ref 0.00–0.07)
Basophils Absolute: 0 10*3/uL (ref 0.0–0.1)
Basophils Relative: 0 %
Eosinophils Absolute: 0 10*3/uL (ref 0.0–0.5)
Eosinophils Relative: 0 %
HCT: 34.9 % — ABNORMAL LOW (ref 36.0–46.0)
Hemoglobin: 11.4 g/dL — ABNORMAL LOW (ref 12.0–15.0)
Immature Granulocytes: 1 %
Lymphocytes Relative: 4 %
Lymphs Abs: 0.4 10*3/uL — ABNORMAL LOW (ref 0.7–4.0)
MCH: 34.8 pg — ABNORMAL HIGH (ref 26.0–34.0)
MCHC: 32.7 g/dL (ref 30.0–36.0)
MCV: 106.4 fL — ABNORMAL HIGH (ref 80.0–100.0)
Monocytes Absolute: 0.7 10*3/uL (ref 0.1–1.0)
Monocytes Relative: 7 %
Neutro Abs: 8.6 10*3/uL — ABNORMAL HIGH (ref 1.7–7.7)
Neutrophils Relative %: 88 %
Platelet Count: 254 10*3/uL (ref 150–400)
RBC: 3.28 MIL/uL — ABNORMAL LOW (ref 3.87–5.11)
RDW: 17.1 % — ABNORMAL HIGH (ref 11.5–15.5)
WBC Count: 9.8 10*3/uL (ref 4.0–10.5)
nRBC: 0 % (ref 0.0–0.2)

## 2021-10-04 LAB — CMP (CANCER CENTER ONLY)
ALT: 12 U/L (ref 0–44)
AST: 10 U/L — ABNORMAL LOW (ref 15–41)
Albumin: 4.1 g/dL (ref 3.5–5.0)
Alkaline Phosphatase: 67 U/L (ref 38–126)
Anion gap: 9 (ref 5–15)
BUN: 21 mg/dL (ref 8–23)
CO2: 25 mmol/L (ref 22–32)
Calcium: 9.8 mg/dL (ref 8.9–10.3)
Chloride: 104 mmol/L (ref 98–111)
Creatinine: 0.83 mg/dL (ref 0.44–1.00)
GFR, Estimated: >60 mL/min (ref >60)
Glucose, Bld: 137 mg/dL — ABNORMAL HIGH (ref 70–99)
Potassium: 4.1 mmol/L (ref 3.5–5.1)
Sodium: 138 mmol/L (ref 135–145)
Total Bilirubin: 0.4 mg/dL (ref 0.3–1.2)
Total Protein: 7.2 g/dL (ref 6.5–8.1)

## 2021-10-04 MED ORDER — CYANOCOBALAMIN 1000 MCG/ML IJ SOLN
1000.0000 ug | Freq: Once | INTRAMUSCULAR | Status: AC
  Administered 2021-10-04: 1000 ug via INTRAMUSCULAR
  Filled 2021-10-04: qty 1

## 2021-10-04 MED ORDER — SODIUM CHLORIDE 0.9 % IV SOLN
10.0000 mg | Freq: Once | INTRAVENOUS | Status: AC
  Administered 2021-10-04: 10 mg via INTRAVENOUS
  Filled 2021-10-04: qty 10

## 2021-10-04 MED ORDER — SODIUM CHLORIDE 0.9 % IV SOLN: Freq: Once | INTRAVENOUS | Status: AC

## 2021-10-04 MED ORDER — OMEPRAZOLE 20 MG PO CPDR: 20.0000 mg | DELAYED_RELEASE_CAPSULE | Freq: Every day | ORAL | 2 refills | Status: DC

## 2021-10-04 MED ORDER — SODIUM CHLORIDE 0.9 % IV SOLN
500.0000 mg/m2 | Freq: Once | INTRAVENOUS | Status: AC
  Administered 2021-10-04: 900 mg via INTRAVENOUS
  Filled 2021-10-04: qty 20

## 2021-10-04 NOTE — Patient Instructions (Signed)
[facility] ONCOLOG[facility]   Discharge Instructions: Thank you for choosing [facility] to provide your oncology and hematology care.   If you have a lab appointment with the [facility], please go directly to the [facility] and check in at the registration area.   Wear comfortable clothing and clothing appropriate for easy access to any Portacath or PICC line.   We strive to give you quality time with your provider. You may need to reschedule your appointment if you arrive late (15 or more minutes).  Arriving late affects you and other patients whose appointments are after yours.  Also, if you miss three or more appointments without notifying the office, you may be dismissed from the clinic at the providers discretion.      For prescription refill requests, have your pharmacy contact our office and allow 72 hours for refills to be completed.    Today you received the following chemotherapy and/or immunotherapy agents: pemetrexed      To help prevent nausea and vomiting after your treatment, we encourage you to take your nausea medication as directed.  BELOW ARE SYMPTOMS THAT SHOULD BE REPORTED IMMEDIATELY: *FEVER GREATER THAN 100.4 F (38 C) OR HIGHER *CHILLS OR SWEATING *NAUSEA AND VOMITING THAT IS NOT CONTROLLED WITH YOUR NAUSEA MEDICATION *UNUSUAL SHORTNESS OF BREATH *UNUSUAL BRUISING OR BLEEDING *URINARY PROBLEMS (pain or burning when urinating, or frequent urination) *BOWEL PROBLEMS (unusual diarrhea, constipation, pain near the anus) TENDERNESS IN MOUTH AND THROAT WITH OR WITHOUT PRESENCE OF ULCERS (sore throat, sores in mouth, or a toothache) UNUSUAL RASH, SWELLING OR PAIN  UNUSUAL VAGINAL DISCHARGE OR ITCHING   Items with * indicate a potential emergency and should be followed up as soon as possible or go to the Emergency Department if any problems should occur.  Please show the CHEMOTHERAPY ALERT CARD or IMMUNOTHERAPY ALERT CARD at check-in  to the Emergency Department and triage nurse.  Should you have questions after your visit or need to cancel or reschedule your appointment, please contact [facility]  Dept: [phone]  and follow the prompts.  Office hours are 8:00 a.m. to 4:30 p.m. Monday - Friday. Please note that voicemails left after 4:00 p.m. may not be returned until the following business day.  We are closed weekends and major holidays. You have access to a nurse at all times for urgent questions. Please call the main number to the clinic Dept: [phone] and follow the prompts.   For any non-urgent questions, you may also contact your provider using MyChart. We now offer e-Visits for anyone 52 and older to request care online for non-urgent symptoms. For details visit [email or web address].   Also download the MyChart app! Go to the app store, search "MyChart", open the app, select [facility], and log in with your MyChart username and password.  Due to Covid, a mask is required upon entering the hospital/clinic. If you do not have a mask, one will be given to you upon arrival. For doctor visits, patients may have 1 support person aged 65 or older with them. For treatment visits, patients cannot have anyone with them due to current Covid guidelines and our immunocompromised population.

## 2021-10-12 ENCOUNTER — Other Ambulatory Visit: Payer: Self-pay | Admitting: Radiation Therapy

## 2021-10-12 DIAGNOSIS — C7931 Secondary malignant neoplasm of brain: Secondary | ICD-10-CM

## 2021-10-13 ENCOUNTER — Other Ambulatory Visit: Payer: Self-pay | Admitting: Radiation Therapy

## 2021-10-13 ENCOUNTER — Encounter: Payer: Self-pay | Admitting: Radiation Therapy

## 2021-10-13 NOTE — Progress Notes (Signed)
Written reminder card including my contact information was mailed out to pt. regarding her May Brain MRI and telephone follow-up with Shona Simpson PA-C, to review the results.   Mont Dutton R.T.(R)(T) Radiation Special Procedures Navigator

## 2021-10-22 MED FILL — Dexamethasone Sodium Phosphate Inj 100 MG/10ML: INTRAMUSCULAR | Qty: 1 | Status: AC

## 2021-10-25 ENCOUNTER — Inpatient Hospital Stay: Payer: PPO

## 2021-10-25 ENCOUNTER — Other Ambulatory Visit: Payer: Self-pay

## 2021-10-25 ENCOUNTER — Inpatient Hospital Stay (HOSPITAL_BASED_OUTPATIENT_CLINIC_OR_DEPARTMENT_OTHER): Payer: PPO | Admitting: Internal Medicine

## 2021-10-25 VITALS — BP 148/62 | HR 86 | Temp 97.5°F | Resp 18 | Ht 64.0 in | Wt 153.4 lb

## 2021-10-25 DIAGNOSIS — Z5111 Encounter for antineoplastic chemotherapy: Secondary | ICD-10-CM

## 2021-10-25 DIAGNOSIS — C3411 Malignant neoplasm of upper lobe, right bronchus or lung: Secondary | ICD-10-CM

## 2021-10-25 DIAGNOSIS — Z5112 Encounter for antineoplastic immunotherapy: Secondary | ICD-10-CM | POA: Diagnosis not present

## 2021-10-25 LAB — CBC WITH DIFFERENTIAL (CANCER CENTER ONLY)
Abs Immature Granulocytes: 0.07 10*3/uL (ref 0.00–0.07)
Basophils Absolute: 0 10*3/uL (ref 0.0–0.1)
Basophils Relative: 0 %
Eosinophils Absolute: 0 10*3/uL (ref 0.0–0.5)
Eosinophils Relative: 0 %
HCT: 35.5 % — ABNORMAL LOW (ref 36.0–46.0)
Hemoglobin: 11.7 g/dL — ABNORMAL LOW (ref 12.0–15.0)
Immature Granulocytes: 1 %
Lymphocytes Relative: 4 %
Lymphs Abs: 0.4 10*3/uL — ABNORMAL LOW (ref 0.7–4.0)
MCH: 34.8 pg — ABNORMAL HIGH (ref 26.0–34.0)
MCHC: 33 g/dL (ref 30.0–36.0)
MCV: 105.7 fL — ABNORMAL HIGH (ref 80.0–100.0)
Monocytes Absolute: 0.6 10*3/uL (ref 0.1–1.0)
Monocytes Relative: 7 %
Neutro Abs: 8.4 10*3/uL — ABNORMAL HIGH (ref 1.7–7.7)
Neutrophils Relative %: 88 %
Platelet Count: 289 10*3/uL (ref 150–400)
RBC: 3.36 MIL/uL — ABNORMAL LOW (ref 3.87–5.11)
RDW: 15 % (ref 11.5–15.5)
WBC Count: 9.5 10*3/uL (ref 4.0–10.5)
nRBC: 0 % (ref 0.0–0.2)

## 2021-10-25 LAB — CMP (CANCER CENTER ONLY)
ALT: 12 U/L (ref 0–44)
AST: 11 U/L — ABNORMAL LOW (ref 15–41)
Albumin: 4.1 g/dL (ref 3.5–5.0)
Alkaline Phosphatase: 63 U/L (ref 38–126)
Anion gap: 9 (ref 5–15)
BUN: 25 mg/dL — ABNORMAL HIGH (ref 8–23)
CO2: 24 mmol/L (ref 22–32)
Calcium: 10 mg/dL (ref 8.9–10.3)
Chloride: 104 mmol/L (ref 98–111)
Creatinine: 0.95 mg/dL (ref 0.44–1.00)
GFR, Estimated: >60 mL/min (ref >60)
Glucose, Bld: 141 mg/dL — ABNORMAL HIGH (ref 70–99)
Potassium: 4.4 mmol/L (ref 3.5–5.1)
Sodium: 137 mmol/L (ref 135–145)
Total Bilirubin: 0.4 mg/dL (ref 0.3–1.2)
Total Protein: 7.1 g/dL (ref 6.5–8.1)

## 2021-10-25 MED ORDER — SODIUM CHLORIDE 0.9 % IV SOLN
10.0000 mg | Freq: Once | INTRAVENOUS | Status: AC
  Administered 2021-10-25: 10 mg via INTRAVENOUS
  Filled 2021-10-25: qty 10

## 2021-10-25 MED ORDER — SODIUM CHLORIDE 0.9 % IV SOLN
500.0000 mg/m2 | Freq: Once | INTRAVENOUS | Status: AC
  Administered 2021-10-25: 900 mg via INTRAVENOUS
  Filled 2021-10-25: qty 20

## 2021-10-25 MED ORDER — CYANOCOBALAMIN 1000 MCG/ML IJ SOLN: 1000.0000 ug | Freq: Once | INTRAMUSCULAR | Status: DC

## 2021-10-25 MED ORDER — SODIUM CHLORIDE 0.9 % IV SOLN: Freq: Once | INTRAVENOUS | Status: AC

## 2021-10-25 NOTE — Patient Instructions (Signed)
[facility]COL[facility]   Discharge Instructions: Thank you for choosing [facility] to provide your oncology and hematology care.   If you have a lab appointment with the [facility], please go directly to the [facility] and check in at the registration area.   Wear comfortable clothing and clothing appropriate for easy access to any Portacath or PICC line.   We strive to give you quality time with your provider. You may need to reschedule your appointment if you arrive late (15 or more minutes).  Arriving late affects you and other patients whose appointments are after yours.  Also, if you miss three or more appointments without notifying the office, you may be dismissed from the clinic at the providers discretion.      For prescription refill requests, have your pharmacy contact our office and allow 72 hours for refills to be completed.    Today you received the following chemotherapy and/or immunotherapy agents: pemetrexed      To help prevent nausea and vomiting after your treatment, we encourage you to take your nausea medication as directed.  BELOW ARE SYMPTOMS THAT SHOULD BE REPORTED IMMEDIATELY: *FEVER GREATER THAN 100.4 F (38 C) OR HIGHER *CHILLS OR SWEATING *NAUSEA AND VOMITING THAT IS NOT CONTROLLED WITH YOUR NAUSEA MEDICATION *UNUSUAL SHORTNESS OF BREATH *UNUSUAL BRUISING OR BLEEDING *URINARY PROBLEMS (pain or burning when urinating, or frequent urination) *BOWEL PROBLEMS (unusual diarrhea, constipation, pain near the anus) TENDERNESS IN MOUTH AND THROAT WITH OR WITHOUT PRESENCE OF ULCERS (sore throat, sores in mouth, or a toothache) UNUSUAL RASH, SWELLING OR PAIN  UNUSUAL VAGINAL DISCHARGE OR ITCHING   Items with * indicate a potential emergency and should be followed up as soon as possible or go to the Emergency Department if any problems should occur.  Please show the CHEMOTHERAPY ALERT CARD or IMMUNOTHERAPY ALERT CARD at check-in  to the Emergency Department and triage nurse.  Should you have questions after your visit or need to cancel or reschedule your appointment, please contact [facility]  Dept: [phone]  and follow the prompts.  Office hours are 8:00 a.m. to 4:30 p.m. Monday - Friday. Please note that voicemails left after 4:00 p.m. may not be returned until the following business day.  We are closed weekends and major holidays. You have access to a nurse at all times for urgent questions. Please call the main number to the clinic Dept: [phone] and follow the prompts.   For any non-urgent questions, you may also contact your provider using MyChart. We now offer e-Visits for anyone 81 and older to request care online for non-urgent symptoms. For details visit [email or web address].   Also download the MyChart app! Go to the app store, search "MyChart", open the app, select [facility], and log in with your MyChart username and password.  Due to Covid, a mask is required upon entering the hospital/clinic. If you do not have a mask, one will be given to you upon arrival. For doctor visits, patients may have 1 support person aged 3 or older with them. For treatment visits, patients cannot have anyone with them due to current Covid guidelines and our immunocompromised population.

## 2021-10-25 NOTE — Progress Notes (Signed)
[facility] Telephone:([phone]) [phone]   [phone]  OFFICE PROGRESS NOTE  [name], NP [place] [address]  DIAGNOSIS: Stage IV (T2a, N0, M1b) non-small cell lung cancer, adenocarcinoma with negative EGFR and ALK mutations diagnosed in January 2015 and presented with right upper lobe lung mass in addition to a solitary brain metastasis.  PRIOR THERAPY: 1) Status post stereotactic radiotherapy to a solitary right parietal brain lesion under the care of Dr. [name] on 10/16/2013. 2) Status post palliative radiotherapy to the right lung tumor under the care of Dr. [name] completed on 12/05/2013. 3) Systemic chemotherapy with carboplatin for AUC of 5 and Alimta 500 mg/M2 every 3 weeks. First dose Jan 06 2014. Status post 6 cycles.  CURRENT THERAPY: Systemic chemotherapy with maintenance Alimta 500 MG/M2 every 3 weeks, status post 127 cycles.  INTERVAL HISTORY: Alexis [name] 71 y.o. female returns to the clinic today for follow-up visit.  The patient is feeling fine today with no concerning complaints.  She denied having any current chest pain, shortness of breath, cough or hemoptysis.  She denied having any fever or chills.  She has no nausea, vomiting, diarrhea or constipation.  She has no headache or visual changes.  She denied having any significant weight loss or night sweats.  She continues to tolerate her treatment with maintenance Alimta fairly well she is here today for evaluation before starting cycle #128 of her treatment.   MEDICAL HISTORY: Past Medical History:  Diagnosis Date   Anxiety    Anxiety 06/20/2016   Cervical cancer ([facility])    Diabetes mellitus without complication ([facility])    patient states she has type 2   Encounter for antineoplastic chemotherapy 07/20/2015   Malignant neoplasm of right upper lobe of lung ([facility])     non small cell lung cancer adenocarcioma with brain meta    ALLERGIES:  is allergic to  codeine.  MEDICATIONS:  Current Outpatient Medications  Medication Sig Dispense Refill   acetaminophen (TYLENOL) 500 MG tablet Take 500 mg by mouth every 6 (six) hours as needed for mild pain or headache. Reported on 11/02/2015     ALPRAZolam (XANAX) 1 MG tablet Take 1 tablet (1 mg total) by mouth 2 (two) times daily as needed for anxiety. 30 tablet 1   Ascorbic Acid (VITAMIN C GUMMIE PO) Take 1 each by mouth every morning.     aspirin EC 81 MG tablet Take 1 tablet (81 mg total) by mouth daily. 150 tablet 2   [employer] ACID CONTROLLER 10 MG tablet SMARTSIG:1 Tablet(s) By Mouth Every 12 Hours PRN     dexamethasone (DECADRON) 4 MG tablet Take 1 tablet twice a day the day before, the day of, and the day after chemotherapy. 40 tablet 2   folic acid (FOLVITE) 1 MG tablet TAKE 1 TABLET BY MOUTH EVERY DAY 90 tablet 0   loratadine (CLARITIN) 10 MG tablet Take 10 mg by mouth daily.     metFORMIN (GLUCOPHAGE) 500 MG tablet Take 500 mg by mouth 2 (two) times daily.     Multiple Vitamin (MULTIVITAMIN WITH MINERALS) TABS tablet Take 1 tablet by mouth every morning.     omeprazole (PRILOSEC) 20 MG capsule Take 1 capsule (20 mg total) by mouth daily. 30 capsule 2   ondansetron (ZOFRAN) 8 MG tablet TAKE 1 TABLET BY MOUTH BEFORE CHEMO 30 tablet 0   OVER THE COUNTER MEDICATION Take 1 tablet by mouth every  morning. (Vitamin A)     prochlorperazine (COMPAZINE) 10 MG tablet Take 1 tablet (10 mg total) by mouth every 6 (six) hours as needed for nausea or vomiting. 60 tablet 0   rosuvastatin (CRESTOR) 10 MG tablet Take 1 tablet (10 mg total) by mouth daily. 30 tablet 12   senna-docusate (SENOKOT-S) 8.6-50 MG tablet Take 1 tablet by mouth daily. 30 tablet prn   triamcinolone ointment (KENALOG) 0.5 % Apply 1 application topically 2 (two) times daily. 30 g 1   trimethoprim (TRIMPEX) 100 MG tablet Take 100 mg by mouth at bedtime.     valACYclovir (VALTREX) 1000 MG tablet Take 1 tablet (1,000 mg total) by mouth 2 (two) times  daily. 14 tablet 0   Vitamin D, Ergocalciferol, (DRISDOL) 1.25 MG (50000 UNIT) CAPS capsule Take 50,000 Units by mouth once a week.     No current facility-administered medications for this visit.    SURGICAL HISTORY:  Past Surgical History:  Procedure Laterality Date   ABDOMINAL HYSTERECTOMY     COLOSTOMY TAKEDOWN N/A 07/10/2014   Procedure: LAPAROSCOPIC LYSIS OF ADHESIONS (90 MIN) LAPAROSCOPIC ASSISTED COLOSTOMY CLOSURE, RIGID PROCTOSIGMOIDOSCOPY;  Surgeon: Jackolyn Confer, MD;  Location: [facility];  Service: General;  Laterality: N/A;   LAPAROTOMY N/A 11/03/2013   Procedure: EXPLORATORY LAPAROTOMY, DRAINAGE OF INTRA  ABDOMINAL ABSCESSES, MOBILIZATION OF SPLENIC FLEXURE, SIGMOID COLECTOMY WITH COLOSTOMY;  Surgeon: Odis Hollingshead, MD;  Location: [facility];  Service: General;  Laterality: N/A;   VIDEO BRONCHOSCOPY Bilateral 08/30/2013   Procedure: VIDEO BRONCHOSCOPY WITH FLUORO;  Surgeon: Tanda Rockers, MD;  Location: [facility];  Service: Cardiopulmonary;  Laterality: Bilateral;    REVIEW OF SYSTEMS:  A comprehensive review of systems was negative.   PHYSICAL EXAMINATION: General appearance: alert, cooperative, and no distress Head: Normocephalic, without obvious abnormality, atraumatic Neck: no adenopathy, no JVD, supple, symmetrical, trachea midline, and thyroid not enlarged, symmetric, no tenderness/mass/nodules Lymph nodes: Cervical, supraclavicular, and axillary nodes normal. Resp: clear to auscultation bilaterally Back: symmetric, no curvature. ROM normal. No CVA tenderness. Cardio: regular rate and rhythm, S1, S2 normal, no murmur, click, rub or gallop GI: soft, non-tender; bowel sounds normal; no masses,  no organomegaly Extremities: extremities normal, atraumatic, no cyanosis or edema   ECOG PERFORMANCE STATUS: 1 - Symptomatic but completely ambulatory   Blood pressure (!) 148/62, pulse 86, temperature (!) 97.5 F (36.4 C), temperature source Tympanic, resp. rate 18, height 5'  4" (1.626 m), weight 153 lb 7 oz (69.6 kg), SpO2 100 %.  LABORATORY DATA: Lab Results  Component Value Date   WBC 9.5 10/25/2021   HGB 11.7 (L) 10/25/2021   HCT 35.5 (L) 10/25/2021   MCV 105.7 (H) 10/25/2021   PLT 289 10/25/2021      Chemistry      Component Value Date/Time   NA 137 10/25/2021 0816   NA 137 08/31/2021 0900   NA 139 08/14/2017 0837   K 4.4 10/25/2021 0816   K 4.4 08/14/2017 0837   CL 104 10/25/2021 0816   CO2 24 10/25/2021 0816   CO2 24 08/14/2017 0837   BUN 25 (H) 10/25/2021 0816   BUN 24 08/31/2021 0900   BUN 13.3 08/14/2017 0837   CREATININE 0.95 10/25/2021 0816   CREATININE 0.8 08/14/2017 0837      Component Value Date/Time   CALCIUM 10.0 10/25/2021 0816   CALCIUM 9.3 08/14/2017 0837   ALKPHOS 63 10/25/2021 0816   ALKPHOS 107 08/14/2017 0837   AST 11 (L) 10/25/2021 4742  AST 12 08/14/2017 0837   ALT 12 10/25/2021 0816   ALT 12 08/14/2017 0837   BILITOT 0.4 10/25/2021 0816   BILITOT 0.37 08/14/2017 0837       RADIOGRAPHIC STUDIES: CT CHEST ABDOMEN PELVIS W CONTRAST  Result Date: 10/04/2021 CLINICAL DATA:  History of non-small cell lung cancer, restaging. EXAM: CT CHEST, ABDOMEN, AND PELVIS WITH CONTRAST TECHNIQUE: Multidetector CT imaging of the chest, abdomen and pelvis was performed following the standard protocol during bolus administration of intravenous contrast. RADIATION DOSE REDUCTION: This exam was performed according to the departmental dose-optimization program which includes automated exposure control, adjustment of the mA and/or kV according to patient size and/or use of iterative reconstruction technique. CONTRAST:  134m ISOVUE-300 IOPAMIDOL (ISOVUE-300) INJECTION 61% COMPARISON:  Multiple priors including most recent July 09, 2021 FINDINGS: CT CHEST FINDINGS Cardiovascular: Aortic atherosclerosis without aneurysmal dilation. No central pulmonary embolus on this nondedicated study. Normal size heart. No significant pericardial  effusion/thickening. Mediastinum/Nodes: No supraclavicular adenopathy. No discrete thyroid nodule. No pathologically enlarged mediastinal, hilar or axillary lymph nodes. The trachea and esophagus are unremarkable. Lungs/Pleura: Stable post treatment scarring in the medial right upper lobe extending into the right apex. Apical pleuroparenchymal scarring appears similar prior. Unchanged size of the 4 mm ground-glass nodule in the right lower lobe on image 82/6 and 6 mm ground-glass nodule in the right lower lobe on image 95/6. Additional tiny scattered pulmonary nodules are unchanged from prior including a 2 mm left lower lobe pulmonary nodule on image 101/6 and a 2 mm right lower lobe pulmonary nodule on image 98/6. No new suspicious pulmonary nodules or masses. Centrilobular and paraseptal emphysema. No pleural effusion or pneumothorax. Musculoskeletal: No chest wall mass or suspicious bone lesions identified. CT ABDOMEN PELVIS FINDINGS Hepatobiliary: No suspicious hepatic lesion. No gallstones, gallbladder wall thickening, or biliary dilatation. Pancreas: No pancreatic ductal dilatation or surrounding inflammatory changes. Spleen: Normal in size without focal abnormality. Adrenals/Urinary Tract: Bilateral adrenal glands appear normal. No hydronephrosis. 1 cm left upper pole renal cyst. Kidneys demonstrate symmetric enhancement excretion of contrast. No solid enhancing renal mass. Urinary bladder is unremarkable for degree of distension. Stomach/Bowel: Radiopaque enteric contrast material traverses the rectum. Stomach is minimally distended without suspicious wall thickening. No pathologic dilation of small or large bowel. The appendix and terminal ileum appear normal. No suspicious colonic wall thickening or mass like lesions. Colonic anastomosis visualized in the left paramedian pelvis on image 99/2. Vascular/Lymphatic: Aortic atherosclerosis without abdominal aortic aneurysm. No pathologically enlarged abdominal  or pelvic lymph nodes. Reproductive: Status post hysterectomy. No adnexal masses. Other: No significant abdominopelvic free fluid. No pneumoperitoneum. Musculoskeletal: No aggressive lytic or blastic lesion of bone. IMPRESSION: 1. Stable examination without new or progressive findings to suggest recurrent or metastatic disease in the chest abdomen or pelvis. 2. Stable post treatment scarring in the medial right upper lobe extending into the right apex. 3. Stable scattered small bilateral pulmonary nodules without new suspicious pulmonary nodules or masses. 4. Aortic Atherosclerosis (ICD10-I70.0) and Emphysema (ICD10-J43.9). Electronically Signed   By: JDahlia BailiffM.D.   On: 10/04/2021 09:34    ASSESSMENT AND PLAN:  This is a very pleasant 71years old white female with metastatic non-small cell lung cancer, adenocarcinoma status post induction systemic chemotherapy with carboplatin and Alimta with partial response. The patient is currently on maintenance treatment with single agent Alimta status post 127 cycles. The patient tolerated the last cycle of her treatment fairly well with no concerning adverse effects. I recommended for her  to proceed with cycle #128 today as planned. I will see her back for follow-up visit in 3 weeks for evaluation before the next cycle of her treatment For the anxiety she is currently on Xanax. She was advised to call immediately if she has any other concerning symptoms in the interval. The patient voices understanding of current disease status and treatment options and is in agreement with the current care plan. All questions were answered. The patient knows to call the clinic with any problems, questions or concerns. We can certainly see the patient much sooner if necessary.  Disclaimer: This note was dictated with voice recognition software. Similar sounding words can inadvertently be transcribed and may not be corrected upon review.

## 2021-11-01 ENCOUNTER — Encounter: Payer: Self-pay | Admitting: Internal Medicine

## 2021-11-05 ENCOUNTER — Other Ambulatory Visit: Payer: Self-pay | Admitting: Nurse Practitioner

## 2021-11-05 DIAGNOSIS — E559 Vitamin D deficiency, unspecified: Secondary | ICD-10-CM

## 2021-11-08 ENCOUNTER — Telehealth: Payer: Self-pay | Admitting: Nurse Practitioner

## 2021-11-08 ENCOUNTER — Other Ambulatory Visit: Payer: Self-pay | Admitting: Nurse Practitioner

## 2021-11-08 DIAGNOSIS — E119 Type 2 diabetes mellitus without complications: Secondary | ICD-10-CM

## 2021-11-08 MED ORDER — METFORMIN HCL 500 MG PO TABS: 500.0000 mg | ORAL_TABLET | Freq: Two times a day (BID) | ORAL | 1 refills | Status: DC

## 2021-11-08 NOTE — Telephone Encounter (Signed)
Patient is requesting refill of Vit D. She has not had a recent Vit D level checked.  ? ? ?She is also requesting Metformin refill. AS, CMA ?

## 2021-11-08 NOTE — Telephone Encounter (Signed)
I did renew her metformin. Sent refill to her pharmacy. She should take OTC vitamin d 5000 iu daily. The brand name is not important, just that she continue to get regular dosing. She should schedule a routine visit in next month or 6 weeks so we can recheck her HgbA1c. We can also get vitamin d at that time.

## 2021-11-08 NOTE — Telephone Encounter (Signed)
Patient is aware of the below and verbalized understanding. AS, CMA 

## 2021-11-09 ENCOUNTER — Encounter: Payer: Self-pay | Admitting: Internal Medicine

## 2021-11-16 ENCOUNTER — Encounter: Payer: Self-pay | Admitting: Internal Medicine

## 2021-11-16 ENCOUNTER — Other Ambulatory Visit: Payer: Self-pay

## 2021-11-16 ENCOUNTER — Inpatient Hospital Stay: Payer: PPO

## 2021-11-16 ENCOUNTER — Inpatient Hospital Stay: Payer: PPO | Attending: Internal Medicine

## 2021-11-16 ENCOUNTER — Inpatient Hospital Stay (HOSPITAL_BASED_OUTPATIENT_CLINIC_OR_DEPARTMENT_OTHER): Payer: PPO | Admitting: Internal Medicine

## 2021-11-16 VITALS — BP 130/66 | HR 96 | Temp 97.3°F | Resp 18 | Ht 64.0 in | Wt 156.4 lb

## 2021-11-16 DIAGNOSIS — Z7952 Long term (current) use of systemic steroids: Secondary | ICD-10-CM | POA: Diagnosis not present

## 2021-11-16 DIAGNOSIS — C3411 Malignant neoplasm of upper lobe, right bronchus or lung: Secondary | ICD-10-CM

## 2021-11-16 DIAGNOSIS — F419 Anxiety disorder, unspecified: Secondary | ICD-10-CM | POA: Insufficient documentation

## 2021-11-16 DIAGNOSIS — C7931 Secondary malignant neoplasm of brain: Secondary | ICD-10-CM | POA: Diagnosis present

## 2021-11-16 DIAGNOSIS — Z5111 Encounter for antineoplastic chemotherapy: Secondary | ICD-10-CM

## 2021-11-16 DIAGNOSIS — Z7982 Long term (current) use of aspirin: Secondary | ICD-10-CM | POA: Diagnosis not present

## 2021-11-16 DIAGNOSIS — Z8541 Personal history of malignant neoplasm of cervix uteri: Secondary | ICD-10-CM | POA: Insufficient documentation

## 2021-11-16 DIAGNOSIS — Z7984 Long term (current) use of oral hypoglycemic drugs: Secondary | ICD-10-CM | POA: Insufficient documentation

## 2021-11-16 DIAGNOSIS — Z923 Personal history of irradiation: Secondary | ICD-10-CM | POA: Insufficient documentation

## 2021-11-16 DIAGNOSIS — Z79899 Other long term (current) drug therapy: Secondary | ICD-10-CM | POA: Diagnosis not present

## 2021-11-16 LAB — CMP (CANCER CENTER ONLY)
ALT: 12 U/L (ref 0–44)
AST: 11 U/L — ABNORMAL LOW (ref 15–41)
Albumin: 3.9 g/dL (ref 3.5–5.0)
Alkaline Phosphatase: 64 U/L (ref 38–126)
Anion gap: 8 (ref 5–15)
BUN: 19 mg/dL (ref 8–23)
CO2: 28 mmol/L (ref 22–32)
Calcium: 9.7 mg/dL (ref 8.9–10.3)
Chloride: 103 mmol/L (ref 98–111)
Creatinine: 0.8 mg/dL (ref 0.44–1.00)
GFR, Estimated: >60 mL/min (ref >60)
Glucose, Bld: 140 mg/dL — ABNORMAL HIGH (ref 70–99)
Potassium: 4.4 mmol/L (ref 3.5–5.1)
Sodium: 139 mmol/L (ref 135–145)
Total Bilirubin: 0.5 mg/dL (ref 0.3–1.2)
Total Protein: 7 g/dL (ref 6.5–8.1)

## 2021-11-16 LAB — CBC WITH DIFFERENTIAL (CANCER CENTER ONLY)
Abs Immature Granulocytes: 0.08 10*3/uL — ABNORMAL HIGH (ref 0.00–0.07)
Basophils Absolute: 0 10*3/uL (ref 0.0–0.1)
Basophils Relative: 0 %
Eosinophils Absolute: 0 10*3/uL (ref 0.0–0.5)
Eosinophils Relative: 0 %
HCT: 36.5 % (ref 36.0–46.0)
Hemoglobin: 11.9 g/dL — ABNORMAL LOW (ref 12.0–15.0)
Immature Granulocytes: 1 %
Lymphocytes Relative: 6 %
Lymphs Abs: 0.5 10*3/uL — ABNORMAL LOW (ref 0.7–4.0)
MCH: 34.1 pg — ABNORMAL HIGH (ref 26.0–34.0)
MCHC: 32.6 g/dL (ref 30.0–36.0)
MCV: 104.6 fL — ABNORMAL HIGH (ref 80.0–100.0)
Monocytes Absolute: 0.6 10*3/uL (ref 0.1–1.0)
Monocytes Relative: 8 %
Neutro Abs: 6.9 10*3/uL (ref 1.7–7.7)
Neutrophils Relative %: 85 %
Platelet Count: 307 10*3/uL (ref 150–400)
RBC: 3.49 MIL/uL — ABNORMAL LOW (ref 3.87–5.11)
RDW: 14.4 % (ref 11.5–15.5)
WBC Count: 8.1 10*3/uL (ref 4.0–10.5)
nRBC: 0 % (ref 0.0–0.2)

## 2021-11-16 MED ORDER — SODIUM CHLORIDE 0.9 % IV SOLN
10.0000 mg | Freq: Once | INTRAVENOUS | Status: AC
  Administered 2021-11-16: 10 mg via INTRAVENOUS
  Filled 2021-11-16: qty 10

## 2021-11-16 MED ORDER — SODIUM CHLORIDE 0.9 % IV SOLN: Freq: Once | INTRAVENOUS | Status: AC

## 2021-11-16 MED ORDER — SODIUM CHLORIDE 0.9 % IV SOLN
500.0000 mg/m2 | Freq: Once | INTRAVENOUS | Status: AC
  Administered 2021-11-16: 900 mg via INTRAVENOUS
  Filled 2021-11-16: qty 20

## 2021-11-16 NOTE — Progress Notes (Signed)
?    [facility] ?[phone]   [phone] ? ?OFFICE PROGRESS NOTE ? ?[name], NP ?[place] ?[place] [address] ? ?DIAGNOSIS: Stage IV (T2a, N0, M1b) non-small cell lung cancer, adenocarcinoma with negative EGFR and ALK mutations diagnosed in January 2015 and presented with right upper lobe lung mass in addition to a solitary brain metastasis. ? ?PRIOR THERAPY: ?1) Status post stereotactic radiotherapy to a solitary right parietal brain lesion under the care of Dr. [name] on 10/16/2013. ?2) Status post palliative radiotherapy to the right lung tumor under the care of Dr. [name] completed on 12/05/2013. ?3) Systemic chemotherapy with carboplatin for AUC of 5 and Alimta 500 mg/M2 every 3 weeks. First dose Jan 06 2014. Status post 6 cycles. ? ?CURRENT THERAPY: Systemic chemotherapy with maintenance Alimta 500 MG/M2 every 3 weeks, status post 128 cycles. ? ?INTERVAL HISTORY: ?Alexis [name] 71 y.o. female returns to the clinic today for follow-up visit.  The patient is feeling fine today with no concerning complaints.  She continues to tolerate her maintenance treatment with Alimta fairly well.  She denied having any current chest pain, shortness of breath, cough or hemoptysis.  She has no nausea, vomiting, diarrhea or constipation.  She has no dysuria or hematuria.  She has no recent weight loss or night sweats.  She is here today for evaluation before starting cycle #129 of her treatment with Alimta. ? ?MEDICAL HISTORY: ?Past Medical History:  ?Diagnosis Date  ? Anxiety   ? Anxiety 06/20/2016  ? Cervical cancer ([facility])   ? Diabetes mellitus without complication ([facility])   ? patient states she has type 2  ? Encounter for antineoplastic chemotherapy 07/20/2015  ? Malignant neoplasm of right upper lobe of lung ([facility])   ?  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 ? ?ALLERGIES:  is allergic to codeine. ? ?MEDICATIONS:  ?Current Outpatient Medications   ?Medication Sig Dispense Refill  ? acetaminophen (TYLENOL) 500 MG tablet Take 500 mg by mouth every 6 (six) hours as needed for mild pain or headache. Reported on 11/02/2015    ? ALPRAZolam (XANAX) 1 MG tablet Take 1 tablet (1 mg total) by mouth 2 (two) times daily as needed for anxiety. 30 tablet 1  ? Ascorbic Acid (VITAMIN C GUMMIE PO) Take 1 each by mouth every morning.    ? aspirin EC 81 MG tablet Take 1 tablet (81 mg total) by mouth daily. 150 tablet 2  ? [employer] ACID CONTROLLER 10 MG tablet SMARTSIG:1 Tablet(s) By Mouth Every 12 Hours PRN    ? dexamethasone (DECADRON) 4 MG tablet Take 1 tablet twice a day the day before, the day of, and the day after chemotherapy. 40 tablet 2  ? folic acid (FOLVITE) 1 MG tablet TAKE 1 TABLET BY MOUTH EVERY DAY 90 tablet 0  ? loratadine (CLARITIN) 10 MG tablet Take 10 mg by mouth daily.    ? metFORMIN (GLUCOPHAGE) 500 MG tablet Take 1 tablet (500 mg total) by mouth 2 (two) times daily. 60 tablet 1  ? Multiple Vitamin (MULTIVITAMIN WITH MINERALS) TABS tablet Take 1 tablet by mouth every morning.    ? omeprazole (PRILOSEC) 20 MG capsule Take 1 capsule (20 mg total) by mouth daily. 30 capsule 2  ? ondansetron (ZOFRAN) 8 MG tablet TAKE 1 TABLET BY MOUTH BEFORE CHEMO 30 tablet 0  ? OVER THE COUNTER MEDICATION Take 1 tablet by mouth every morning. (Vitamin A)    ?  prochlorperazine (COMPAZINE) 10 MG tablet Take 1 tablet (10 mg total) by mouth every 6 (six) hours as needed for nausea or vomiting. 60 tablet 0  ? rosuvastatin (CRESTOR) 10 MG tablet Take 1 tablet (10 mg total) by mouth daily. 30 tablet 12  ? senna-docusate (SENOKOT-S) 8.6-50 MG tablet Take 1 tablet by mouth daily. 30 tablet prn  ? triamcinolone ointment (KENALOG) 0.5 % Apply 1 application topically 2 (two) times daily. 30 g 1  ? trimethoprim (TRIMPEX) 100 MG tablet Take 100 mg by mouth at bedtime.    ? valACYclovir (VALTREX) 1000 MG tablet Take 1 tablet (1,000 mg total) by mouth 2 (two) times daily. 14 tablet 0  ? Vitamin D,  Ergocalciferol, (DRISDOL) 1.25 MG (50000 UNIT) CAPS capsule Take 50,000 Units by mouth once a week.    ? ?No current facility-administered medications for this visit.  ? ? ?SURGICAL HISTORY:  ?Past Surgical History:  ?Procedure Laterality Date  ? ABDOMINAL HYSTERECTOMY    ? COLOSTOMY TAKEDOWN N/A 07/10/2014  ? Procedure: LAPAROSCOPIC LYSIS OF ADHESIONS (90 MIN) LAPAROSCOPIC ASSISTED COLOSTOMY CLOSURE, RIGID PROCTOSIGMOIDOSCOPY;  Surgeon: Jackolyn Confer, MD;  Location: [facility];  Service: General;  Laterality: N/A;  ? LAPAROTOMY N/A 11/03/2013  ? Procedure: EXPLORATORY LAPAROTOMY, DRAINAGE OF INTRA  ABDOMINAL ABSCESSES, MOBILIZATION OF SPLENIC FLEXURE, SIGMOID COLECTOMY WITH COLOSTOMY;  Surgeon: Odis Hollingshead, MD;  Location: [facility];  Service: General;  Laterality: N/A;  ? VIDEO BRONCHOSCOPY Bilateral 08/30/2013  ? Procedure: VIDEO BRONCHOSCOPY WITH FLUORO;  Surgeon: Tanda Rockers, MD;  Location: [facility];  Service: Cardiopulmonary;  Laterality: Bilateral;  ? ? ?REVIEW OF SYSTEMS:  A comprehensive review of systems was negative.  ? ?PHYSICAL EXAMINATION: General appearance: alert, cooperative, and no distress ?Head: Normocephalic, without obvious abnormality, atraumatic ?Neck: no adenopathy, no JVD, supple, symmetrical, trachea midline, and thyroid not enlarged, symmetric, no tenderness/mass/nodules ?Lymph nodes: Cervical, supraclavicular, and axillary nodes normal. ?Resp: clear to auscultation bilaterally ?Back: symmetric, no curvature. ROM normal. No CVA tenderness. ?Cardio: regular rate and rhythm, S1, S2 normal, no murmur, click, rub or gallop ?GI: soft, non-tender; bowel sounds normal; no masses,  no organomegaly ?Extremities: extremities normal, atraumatic, no cyanosis or edema  ? ?ECOG PERFORMANCE STATUS: 1 - Symptomatic but completely ambulatory  ? ?Blood pressure 130/66, pulse 96, temperature (!) 97.3 ?F (36.3 ?C), temperature source Tympanic, resp. rate 18, height 5' 4"  (1.626 m), weight 156 lb 6.4 oz  (70.9 kg), SpO2 100 %. ? ?LABORATORY DATA: ?Lab Results  ?Component Value Date  ? WBC 9.5 10/25/2021  ? HGB 11.7 (L) 10/25/2021  ? HCT 35.5 (L) 10/25/2021  ? MCV 105.7 (H) 10/25/2021  ? PLT 289 10/25/2021  ? ? ?  Chemistry   ?   ?Component Value Date/Time  ? NA 137 10/25/2021 0816  ? NA 137 08/31/2021 0900  ? NA 139 08/14/2017 0837  ? K 4.4 10/25/2021 0816  ? K 4.4 08/14/2017 0837  ? CL 104 10/25/2021 0816  ? CO2 24 10/25/2021 0816  ? CO2 24 08/14/2017 0837  ? BUN 25 (H) 10/25/2021 0816  ? BUN 24 08/31/2021 0900  ? BUN 13.3 08/14/2017 0837  ? CREATININE 0.95 10/25/2021 0816  ? CREATININE 0.8 08/14/2017 0837  ?    ?Component Value Date/Time  ? CALCIUM 10.0 10/25/2021 0816  ? CALCIUM 9.3 08/14/2017 0837  ? ALKPHOS 63 10/25/2021 0816  ? ALKPHOS 107 08/14/2017 0837  ? AST 11 (L) 10/25/2021 0816  ? AST 12 08/14/2017 0837  ? ALT 12  10/25/2021 0816  ? ALT 12 08/14/2017 0837  ? BILITOT 0.4 10/25/2021 0816  ? BILITOT 0.37 08/14/2017 0837  ?  ? ? ? ?RADIOGRAPHIC STUDIES: ?No results found. ? ?ASSESSMENT AND PLAN:  ?This is a very pleasant 71 years old white female with metastatic non-small cell lung cancer, adenocarcinoma status post induction systemic chemotherapy with carboplatin and Alimta with partial response. ?The patient is currently on maintenance treatment with single agent Alimta status post 128 cycles. ?The patient continues to tolerate this treatment well with no concerning adverse effects.  Her CBC today is unremarkable for any abnormality. ?I recommended for her to proceed with cycle #129 today. ?For the anxiety she is currently on Xanax. ?I will see her back for follow-up visit in 3 weeks for evaluation before starting the next cycle of her treatment. ?I will consider repeating her imaging studies after the next cycle of her treatment. ?She was advised to call immediately if she has any other concerning symptoms in the interval. ?The patient voices understanding of current disease status and treatment options  and is in agreement with the current care plan. ?All questions were answered. The patient knows to call the clinic with any problems, questions or concerns. We can certainly see the patient much sooner i

## 2021-11-16 NOTE — Patient Instructions (Signed)
[facility]   ?Discharge Instructions: ?Thank you for choosing [facility] to provide your oncology and hematology care.  ? ?If you have a lab appointment with the [facility], please go directly to the [facility] and check in at the registration area. ?  ?Wear comfortable clothing and clothing appropriate for easy access to any Portacath or PICC line.  ? ?We strive to give you quality time with your provider. You may need to reschedule your appointment if you arrive late (15 or more minutes).  Arriving late affects you and other patients whose appointments are after yours.  Also, if you miss three or more appointments without notifying the office, you may be dismissed from the clinic at the provider?s discretion.    ?  ?For prescription refill requests, have your pharmacy contact our office and allow 72 hours for refills to be completed.   ? ?Today you received the following chemotherapy and/or immunotherapy agents: pemetrexed    ?  ?To help prevent nausea and vomiting after your treatment, we encourage you to take your nausea medication as directed. ? ?BELOW ARE SYMPTOMS THAT SHOULD BE REPORTED IMMEDIATELY: ?*FEVER GREATER THAN 100.4 F (38 ?C) OR HIGHER ?*CHILLS OR SWEATING ?*NAUSEA AND VOMITING THAT IS NOT CONTROLLED WITH YOUR NAUSEA MEDICATION ?*UNUSUAL SHORTNESS OF BREATH ?*UNUSUAL BRUISING OR BLEEDING ?*URINARY PROBLEMS (pain or burning when urinating, or frequent urination) ?*BOWEL PROBLEMS (unusual diarrhea, constipation, pain near the anus) ?TENDERNESS IN MOUTH AND THROAT WITH OR WITHOUT PRESENCE OF ULCERS (sore throat, sores in mouth, or a toothache) ?UNUSUAL RASH, SWELLING OR PAIN  ?UNUSUAL VAGINAL DISCHARGE OR ITCHING  ? ?Items with * indicate a potential emergency and should be followed up as soon as possible or go to the Emergency Department if any problems should occur. ? ?Please show the CHEMOTHERAPY ALERT CARD or IMMUNOTHERAPY ALERT CARD at check-in  to the Emergency Department and triage nurse. ? ?Should you have questions after your visit or need to cancel or reschedule your appointment, please contact [facility]  Dept: [phone]  and follow the prompts.  Office hours are 8:00 a.m. to 4:30 p.m. Monday - Friday. Please note that voicemails left after 4:00 p.m. may not be returned until the following business day.  We are closed weekends and major holidays. You have access to a nurse at all times for urgent questions. Please call the main number to the clinic Dept: [phone] and follow the prompts. ? ? ?For any non-urgent questions, you may also contact your provider using MyChart. We now offer e-Visits for anyone 11 and older to request care online for non-urgent symptoms. For details visit [email or web address]. ?  ?Also download the MyChart app! Go to the app store, search "MyChart", open the app, select [facility], and log in with your MyChart username and password. ? ?Due to Covid, a mask is required upon entering the hospital/clinic. If you do not have a mask, one will be given to you upon arrival. For doctor visits, patients may have 1 support person aged 4 or older with them. For treatment visits, patients cannot have anyone with them due to current Covid guidelines and our immunocompromised population.  ? ?

## 2021-11-21 ENCOUNTER — Other Ambulatory Visit: Payer: Self-pay | Admitting: Physician Assistant

## 2021-11-21 DIAGNOSIS — C3490 Malignant neoplasm of unspecified part of unspecified bronchus or lung: Secondary | ICD-10-CM

## 2021-12-01 DIAGNOSIS — R300 Dysuria: Secondary | ICD-10-CM | POA: Diagnosis not present

## 2021-12-01 DIAGNOSIS — N3020 Other chronic cystitis without hematuria: Secondary | ICD-10-CM | POA: Diagnosis not present

## 2021-12-01 NOTE — Progress Notes (Signed)
[name] ?OFFICE PROGRESS NOTE ? ?[name], NP ?[address] ?[place] [address] ? ?DIAGNOSIS: Stage IV (T2a, N0, M1b) non-small cell lung cancer, adenocarcinoma with negative EGFR and ALK mutations diagnosed in January 2015 and presented with right upper lobe lung mass in addition to a solitary brain metastasis ? ?PRIOR THERAPY: ?1) Status post stereotactic radiotherapy to a solitary right parietal brain lesion under the care of Dr. [name] on 10/16/2013. ?2) Status post palliative radiotherapy to the right lung tumor under the care of Dr. [name] completed on 12/05/2013. ?3) Systemic chemotherapy with carboplatin for AUC of 5 and Alimta 500 mg/M2 every 3 weeks. First dose Jan 06 2014. Status post 6 cycles. ? ?CURRENT THERAPY: Systemic chemotherapy with maintenance Alimta 500 MG/M2 every 3 weeks, status post 129 cycles. ? ?INTERVAL HISTORY: ?Alexis [name] 71 y.o. female returns to the clinic today for a follow up visit. The patient is feeling good today without any concerning complaints except she has frequent UTIs. She sees Dr. Jeffie Pollock from [facility] urology. She had another UTI and was seen last week on 12/01/21. She was started on two antibiotics last week. She was only able to pick up the first one and is going to pick up the second one today. She reports the dysuria is better. Denies any back pain at this time but reports she had some discomfort in her back last week. She states she is trying to drink plenty of water. Denies fevers.  The patient continues to tolerate treatment with single agent Alimta well without any adverse effects.  She denies night sweats. Denies any chest pain, cough, or hemoptysis. Denies significant or worsening shortness of breath except mild shortness of breath with exertion. Denies any nausea, vomiting, constipation, or unusual diarrhea. If she has any diarrhea, she uses imodium. Denies any headache or visual changes. She is scheduled for a repeat brain  MRI next month on 12/31/21. She is here today for evaluation and repeat blood work before starting cycle 130.  ? ?MEDICAL HISTORY: ?Past Medical History:  ?Diagnosis Date  ? Anxiety   ? Anxiety 06/20/2016  ? Cervical cancer ([place])   ? Diabetes mellitus without complication ([place])   ? patient states she has type 2  ? Encounter for antineoplastic chemotherapy 07/20/2015  ? Malignant neoplasm of right upper lobe of lung ([place])   ?  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 ? ?ALLERGIES:  is allergic to codeine. ? ?MEDICATIONS:  ?Current Outpatient Medications  ?Medication Sig Dispense Refill  ? acetaminophen (TYLENOL) 500 MG tablet Take 500 mg by mouth every 6 (six) hours as needed for mild pain or headache. Reported on 11/02/2015    ? ALPRAZolam (XANAX) 1 MG tablet Take 1 tablet (1 mg total) by mouth 2 (two) times daily as needed for anxiety. 30 tablet 1  ? Ascorbic Acid (VITAMIN C GUMMIE PO) Take 1 each by mouth every morning.    ? aspirin EC 81 MG tablet Take 1 tablet (81 mg total) by mouth daily. 150 tablet 2  ? [employer] ACID CONTROLLER 10 MG tablet SMARTSIG:1 Tablet(s) By Mouth Every 12 Hours PRN    ? dexamethasone (DECADRON) 4 MG tablet Take 1 tablet twice a day the day before, the day of, and the day after chemotherapy. 40 tablet 2  ? folic acid (FOLVITE) 1 MG tablet TAKE 1 TABLET BY MOUTH EVERY DAY 90 tablet 0  ? loratadine (CLARITIN) 10 MG tablet  Take 10 mg by mouth daily.    ? metFORMIN (GLUCOPHAGE) 500 MG tablet Take 1 tablet (500 mg total) by mouth 2 (two) times daily. 60 tablet 1  ? Multiple Vitamin (MULTIVITAMIN WITH MINERALS) TABS tablet Take 1 tablet by mouth every morning.    ? omeprazole (PRILOSEC) 20 MG capsule Take 1 capsule (20 mg total) by mouth daily. 30 capsule 2  ? ondansetron (ZOFRAN) 8 MG tablet TAKE 1 TABLET BY MOUTH BEFORE CHEMO 30 tablet 0  ? OVER THE COUNTER MEDICATION Take 1 tablet by mouth every morning. (Vitamin A)    ? prochlorperazine (COMPAZINE) 10 MG tablet Take 1 tablet (10  mg total) by mouth every 6 (six) hours as needed for nausea or vomiting. 60 tablet 0  ? rosuvastatin (CRESTOR) 10 MG tablet Take 1 tablet (10 mg total) by mouth daily. 30 tablet 12  ? senna-docusate (SENOKOT-S) 8.6-50 MG tablet Take 1 tablet by mouth daily. 30 tablet prn  ? triamcinolone ointment (KENALOG) 0.5 % Apply 1 application topically 2 (two) times daily. 30 g 1  ? trimethoprim (TRIMPEX) 100 MG tablet Take 100 mg by mouth at bedtime.    ? valACYclovir (VALTREX) 1000 MG tablet Take 1 tablet (1,000 mg total) by mouth 2 (two) times daily. 14 tablet 0  ? Vitamin D, Ergocalciferol, (DRISDOL) 1.25 MG (50000 UNIT) CAPS capsule Take 50,000 Units by mouth once a week.    ? ?No current facility-administered medications for this visit.  ? ? ?SURGICAL HISTORY:  ?Past Surgical History:  ?Procedure Laterality Date  ? ABDOMINAL HYSTERECTOMY    ? COLOSTOMY TAKEDOWN N/A 07/10/2014  ? Procedure: LAPAROSCOPIC LYSIS OF ADHESIONS (90 MIN) LAPAROSCOPIC ASSISTED COLOSTOMY CLOSURE, RIGID PROCTOSIGMOIDOSCOPY;  Surgeon: Jackolyn Confer, MD;  Location: [facility];  Service: General;  Laterality: N/A;  ? LAPAROTOMY N/A 11/03/2013  ? Procedure: EXPLORATORY LAPAROTOMY, DRAINAGE OF INTRA  ABDOMINAL ABSCESSES, MOBILIZATION OF SPLENIC FLEXURE, SIGMOID COLECTOMY WITH COLOSTOMY;  Surgeon: Odis Hollingshead, MD;  Location: [facility];  Service: General;  Laterality: N/A;  ? VIDEO BRONCHOSCOPY Bilateral 08/30/2013  ? Procedure: VIDEO BRONCHOSCOPY WITH FLUORO;  Surgeon: Tanda Rockers, MD;  Location: [facility];  Service: Cardiopulmonary;  Laterality: Bilateral;  ? ? ?REVIEW OF SYSTEMS:   ?Review of Systems  ?Constitutional: Negative for appetite change, chills, fatigue, fever and unexpected weight change.  ?HENT:   Negative for mouth sores, nosebleeds, sore throat and trouble swallowing.   ?Eyes: Negative for eye problems and icterus.  ?Respiratory: Negative for cough, hemoptysis, shortness of breath and wheezing.   ?Cardiovascular: Negative for chest  pain and leg swelling.  ?Gastrointestinal: Negative for abdominal pain, constipation, diarrhea, nausea and vomiting.  ?Genitourinary: Negative for bladder incontinence, difficulty urinating, dysuria (resolved), frequency and hematuria.   ?Musculoskeletal: Negative for back pain, gait problem, neck pain and neck stiffness.  ?Skin: Negative for itching and rash.  ?Neurological: Negative for dizziness, extremity weakness, gait problem, headaches, light-headedness and seizures.  ?Hematological: Negative for adenopathy. Does not bruise/bleed easily.  ?Psychiatric/Behavioral: Negative for confusion, depression and sleep disturbance. The patient is not nervous/anxious. ? ? ?PHYSICAL EXAMINATION:  ?Blood pressure (!) 127/46, pulse 84, temperature 97.7 ?F (36.5 ?C), temperature source Oral, resp. rate 18, height _0  (1.626 m), weight 156 lb 2 oz (70.8 kg), SpO2 100 %. ? ?ECOG PERFORMANCE STATUS: 1 ? ?Physical Exam  ?Constitutional: Oriented to person, place, and time and well-developed, well-nourished, and in no distress. No distress.  ?HENT:  ?Head: Normocephalic and atraumatic.  ?Mouth/Throat: Oropharynx is clear  and moist. No oropharyngeal exudate.  ?Eyes: Conjunctivae are normal. Right eye exhibits no discharge. Left eye exhibits no discharge. No scleral icterus.  ?Neck: Normal range of motion. Neck supple.  ?Cardiovascular: Normal rate, regular rhythm, normal heart sounds and intact distal pulses.   ?Pulmonary/Chest: Effort normal and breath sounds normal. No respiratory distress. No wheezes. No rales.  ?Abdominal: Soft. Bowel sounds are normal. Exhibits no distension and no mass. There is no tenderness.  ?Musculoskeletal: Normal range of motion. Exhibits no edema.  ?Lymphadenopathy:  ?  No cervical adenopathy.  ?Neurological: Alert and oriented to person, place, and time. Exhibits normal muscle tone. Gait normal. Coordination normal.  ?Skin: Skin is warm and dry. No rash noted. Not diaphoretic. No erythema. No  pallor.  ?Psychiatric: Mood, memory and judgment normal.  ?Vitals reviewed. ? ?LABORATORY DATA: ?Lab Results  ?Component Value Date  ? WBC 4.8 12/06/2021  ? HGB 10.9 (L) 12/06/2021  ? HCT 33.4 (L) 12/06/2021

## 2021-12-06 ENCOUNTER — Inpatient Hospital Stay: Payer: Medicare Other

## 2021-12-06 ENCOUNTER — Encounter: Payer: Self-pay | Admitting: Internal Medicine

## 2021-12-06 ENCOUNTER — Other Ambulatory Visit: Payer: Self-pay

## 2021-12-06 ENCOUNTER — Inpatient Hospital Stay (HOSPITAL_BASED_OUTPATIENT_CLINIC_OR_DEPARTMENT_OTHER): Payer: Medicare Other | Admitting: Physician Assistant

## 2021-12-06 ENCOUNTER — Inpatient Hospital Stay: Payer: Medicare Other | Attending: Internal Medicine

## 2021-12-06 VITALS — BP 127/46 | HR 84 | Temp 97.7°F | Resp 18 | Ht 64.0 in | Wt 156.1 lb

## 2021-12-06 DIAGNOSIS — C3411 Malignant neoplasm of upper lobe, right bronchus or lung: Secondary | ICD-10-CM | POA: Insufficient documentation

## 2021-12-06 DIAGNOSIS — Z923 Personal history of irradiation: Secondary | ICD-10-CM | POA: Insufficient documentation

## 2021-12-06 DIAGNOSIS — C7931 Secondary malignant neoplasm of brain: Secondary | ICD-10-CM | POA: Insufficient documentation

## 2021-12-06 DIAGNOSIS — Z5111 Encounter for antineoplastic chemotherapy: Secondary | ICD-10-CM

## 2021-12-06 DIAGNOSIS — Z87440 Personal history of urinary (tract) infections: Secondary | ICD-10-CM | POA: Insufficient documentation

## 2021-12-06 LAB — CBC WITH DIFFERENTIAL/PLATELET
Abs Immature Granulocytes: 0.02 10*3/uL (ref 0.00–0.07)
Basophils Absolute: 0 10*3/uL (ref 0.0–0.1)
Basophils Relative: 0 %
Eosinophils Absolute: 0.1 10*3/uL (ref 0.0–0.5)
Eosinophils Relative: 1 %
HCT: 33.4 % — ABNORMAL LOW (ref 36.0–46.0)
Hemoglobin: 10.9 g/dL — ABNORMAL LOW (ref 12.0–15.0)
Immature Granulocytes: 0 %
Lymphocytes Relative: 10 %
Lymphs Abs: 0.5 10*3/uL — ABNORMAL LOW (ref 0.7–4.0)
MCH: 33.7 pg (ref 26.0–34.0)
MCHC: 32.6 g/dL (ref 30.0–36.0)
MCV: 103.4 fL — ABNORMAL HIGH (ref 80.0–100.0)
Monocytes Absolute: 0.3 10*3/uL (ref 0.1–1.0)
Monocytes Relative: 6 %
Neutro Abs: 4 10*3/uL (ref 1.7–7.7)
Neutrophils Relative %: 83 %
Platelets: 228 10*3/uL (ref 150–400)
RBC: 3.23 MIL/uL — ABNORMAL LOW (ref 3.87–5.11)
RDW: 14.3 % (ref 11.5–15.5)
WBC: 4.8 10*3/uL (ref 4.0–10.5)
nRBC: 0 % (ref 0.0–0.2)

## 2021-12-06 LAB — COMPREHENSIVE METABOLIC PANEL
ALT: 10 U/L (ref 0–44)
AST: 13 U/L — ABNORMAL LOW (ref 15–41)
Albumin: 3.7 g/dL (ref 3.5–5.0)
Alkaline Phosphatase: 62 U/L (ref 38–126)
Anion gap: 7 (ref 5–15)
BUN: 18 mg/dL (ref 8–23)
CO2: 26 mmol/L (ref 22–32)
Calcium: 9.2 mg/dL (ref 8.9–10.3)
Chloride: 104 mmol/L (ref 98–111)
Creatinine, Ser: 1.19 mg/dL — ABNORMAL HIGH (ref 0.44–1.00)
GFR, Estimated: 49 mL/min — ABNORMAL LOW (ref >60)
Glucose, Bld: 106 mg/dL — ABNORMAL HIGH (ref 70–99)
Potassium: 4.2 mmol/L (ref 3.5–5.1)
Sodium: 137 mmol/L (ref 135–145)
Total Bilirubin: 0.3 mg/dL (ref 0.3–1.2)
Total Protein: 6.6 g/dL (ref 6.5–8.1)

## 2021-12-06 MED ORDER — SODIUM CHLORIDE 0.9 % IV SOLN
500.0000 mg/m2 | Freq: Once | INTRAVENOUS | Status: AC
  Administered 2021-12-06: 900 mg via INTRAVENOUS
  Filled 2021-12-06: qty 20

## 2021-12-06 MED ORDER — SODIUM CHLORIDE 0.9 % IV SOLN
10.0000 mg | Freq: Once | INTRAVENOUS | Status: AC
  Administered 2021-12-06: 10 mg via INTRAVENOUS
  Filled 2021-12-06: qty 10

## 2021-12-06 MED ORDER — CYANOCOBALAMIN 1000 MCG/ML IJ SOLN
1000.0000 ug | Freq: Once | INTRAMUSCULAR | Status: AC
  Administered 2021-12-06: 1000 ug via INTRAMUSCULAR
  Filled 2021-12-06: qty 1

## 2021-12-06 MED ORDER — SODIUM CHLORIDE 0.9 % IV SOLN: Freq: Once | INTRAVENOUS | Status: AC

## 2021-12-06 NOTE — Patient Instructions (Signed)
[facility]   ?Discharge Instructions: ?Thank you for choosing [facility] to provide your oncology and hematology care.  ? ?If you have a lab appointment with the [facility], please go directly to the [facility] and check in at the registration area. ?  ?Wear comfortable clothing and clothing appropriate for easy access to any Portacath or PICC line.  ? ?We strive to give you quality time with your provider. You may need to reschedule your appointment if you arrive late (15 or more minutes).  Arriving late affects you and other patients whose appointments are after yours.  Also, if you miss three or more appointments without notifying the office, you may be dismissed from the clinic at the provider?s discretion.    ?  ?For prescription refill requests, have your pharmacy contact our office and allow 72 hours for refills to be completed.   ? ?Today you received the following chemotherapy and/or immunotherapy agents: pemetrexed    ?  ?To help prevent nausea and vomiting after your treatment, we encourage you to take your nausea medication as directed. ? ?BELOW ARE SYMPTOMS THAT SHOULD BE REPORTED IMMEDIATELY: ?*FEVER GREATER THAN 100.4 F (38 ?C) OR HIGHER ?*CHILLS OR SWEATING ?*NAUSEA AND VOMITING THAT IS NOT CONTROLLED WITH YOUR NAUSEA MEDICATION ?*UNUSUAL SHORTNESS OF BREATH ?*UNUSUAL BRUISING OR BLEEDING ?*URINARY PROBLEMS (pain or burning when urinating, or frequent urination) ?*BOWEL PROBLEMS (unusual diarrhea, constipation, pain near the anus) ?TENDERNESS IN MOUTH AND THROAT WITH OR WITHOUT PRESENCE OF ULCERS (sore throat, sores in mouth, or a toothache) ?UNUSUAL RASH, SWELLING OR PAIN  ?UNUSUAL VAGINAL DISCHARGE OR ITCHING  ? ?Items with * indicate a potential emergency and should be followed up as soon as possible or go to the Emergency Department if any problems should occur. ? ?Please show the CHEMOTHERAPY ALERT CARD or IMMUNOTHERAPY ALERT CARD at check-in  to the Emergency Department and triage nurse. ? ?Should you have questions after your visit or need to cancel or reschedule your appointment, please contact [facility]  Dept: [phone]  and follow the prompts.  Office hours are 8:00 a.m. to 4:30 p.m. Monday - Friday. Please note that voicemails left after 4:00 p.m. may not be returned until the following business day.  We are closed weekends and major holidays. You have access to a nurse at all times for urgent questions. Please call the main number to the clinic Dept: [phone] and follow the prompts. ? ? ?For any non-urgent questions, you may also contact your provider using MyChart. We now offer e-Visits for anyone 66 and older to request care online for non-urgent symptoms. For details visit [email or web address]. ?  ?Also download the MyChart app! Go to the app store, search "MyChart", open the app, select [facility], and log in with your MyChart username and password. ? ?Due to Covid, a mask is required upon entering the hospital/clinic. If you do not have a mask, one will be given to you upon arrival. For doctor visits, patients may have 1 support person aged 50 or older with them. For treatment visits, patients cannot have anyone with them due to current Covid guidelines and our immunocompromised population.  ? ?

## 2021-12-07 ENCOUNTER — Other Ambulatory Visit: Payer: Self-pay | Admitting: Nurse Practitioner

## 2021-12-07 DIAGNOSIS — E119 Type 2 diabetes mellitus without complications: Secondary | ICD-10-CM

## 2021-12-16 ENCOUNTER — Telehealth: Payer: Self-pay | Admitting: Nurse Practitioner

## 2021-12-16 ENCOUNTER — Other Ambulatory Visit: Payer: Self-pay | Admitting: Nurse Practitioner

## 2021-12-16 DIAGNOSIS — F411 Generalized anxiety disorder: Secondary | ICD-10-CM

## 2021-12-16 MED ORDER — ALPRAZOLAM 1 MG PO TABS: 1.0000 mg | ORAL_TABLET | Freq: Two times a day (BID) | ORAL | 0 refills | Status: DC | PRN

## 2021-12-16 NOTE — Telephone Encounter (Signed)
Patient is requesting a refill of xanax. Please advise.  ?

## 2021-12-16 NOTE — Telephone Encounter (Signed)
Please let her know that I approved and sent her alprazolam for 30 tablets. She does need to have visit to get further refills. Thanks so much.   -HB

## 2021-12-17 NOTE — Telephone Encounter (Signed)
Called pt she is advised of her Rx that was sent in and her recommendation  ?

## 2021-12-21 ENCOUNTER — Ambulatory Visit
Admission: RE | Admit: 2021-12-21 | Discharge: 2021-12-21 | Disposition: A | Discharge status: Home / Self Care | Payer: Medicare Other | Source: Ambulatory Visit | Attending: Physician Assistant | Admitting: Physician Assistant

## 2021-12-21 DIAGNOSIS — C3490 Malignant neoplasm of unspecified part of unspecified bronchus or lung: Secondary | ICD-10-CM | POA: Diagnosis not present

## 2021-12-21 DIAGNOSIS — Z90710 Acquired absence of both cervix and uterus: Secondary | ICD-10-CM | POA: Diagnosis not present

## 2021-12-21 DIAGNOSIS — K6389 Other specified diseases of intestine: Secondary | ICD-10-CM | POA: Diagnosis not present

## 2021-12-21 DIAGNOSIS — C3411 Malignant neoplasm of upper lobe, right bronchus or lung: Secondary | ICD-10-CM

## 2021-12-21 DIAGNOSIS — R918 Other nonspecific abnormal finding of lung field: Secondary | ICD-10-CM | POA: Diagnosis not present

## 2021-12-21 DIAGNOSIS — J479 Bronchiectasis, uncomplicated: Secondary | ICD-10-CM | POA: Diagnosis not present

## 2021-12-21 MED ORDER — IOPAMIDOL (ISOVUE-300) INJECTION 61%
100.0000 mL | Freq: Once | INTRAVENOUS | Status: AC | PRN
  Administered 2021-12-21: 100 mL via INTRAVENOUS

## 2021-12-28 ENCOUNTER — Inpatient Hospital Stay: Payer: Medicare Other | Attending: Internal Medicine

## 2021-12-28 ENCOUNTER — Inpatient Hospital Stay (HOSPITAL_BASED_OUTPATIENT_CLINIC_OR_DEPARTMENT_OTHER): Payer: Medicare Other | Admitting: Internal Medicine

## 2021-12-28 ENCOUNTER — Other Ambulatory Visit: Payer: Self-pay

## 2021-12-28 ENCOUNTER — Inpatient Hospital Stay: Payer: Medicare Other

## 2021-12-28 VITALS — BP 122/67 | HR 92 | Temp 97.7°F | Resp 17 | Wt 158.2 lb

## 2021-12-28 DIAGNOSIS — Z7952 Long term (current) use of systemic steroids: Secondary | ICD-10-CM | POA: Insufficient documentation

## 2021-12-28 DIAGNOSIS — C3411 Malignant neoplasm of upper lobe, right bronchus or lung: Secondary | ICD-10-CM

## 2021-12-28 DIAGNOSIS — C7931 Secondary malignant neoplasm of brain: Secondary | ICD-10-CM | POA: Diagnosis not present

## 2021-12-28 DIAGNOSIS — T451X5A Adverse effect of antineoplastic and immunosuppressive drugs, initial encounter: Secondary | ICD-10-CM

## 2021-12-28 DIAGNOSIS — Z7984 Long term (current) use of oral hypoglycemic drugs: Secondary | ICD-10-CM | POA: Diagnosis not present

## 2021-12-28 DIAGNOSIS — Z79899 Other long term (current) drug therapy: Secondary | ICD-10-CM | POA: Insufficient documentation

## 2021-12-28 DIAGNOSIS — Z5111 Encounter for antineoplastic chemotherapy: Secondary | ICD-10-CM

## 2021-12-28 DIAGNOSIS — Z7982 Long term (current) use of aspirin: Secondary | ICD-10-CM | POA: Insufficient documentation

## 2021-12-28 DIAGNOSIS — D6481 Anemia due to antineoplastic chemotherapy: Secondary | ICD-10-CM | POA: Diagnosis not present

## 2021-12-28 LAB — CBC WITH DIFFERENTIAL/PLATELET
Abs Immature Granulocytes: 0.02 10*3/uL (ref 0.00–0.07)
Basophils Absolute: 0 10*3/uL (ref 0.0–0.1)
Basophils Relative: 1 %
Eosinophils Absolute: 0.2 10*3/uL (ref 0.0–0.5)
Eosinophils Relative: 5 %
HCT: 34.5 % — ABNORMAL LOW (ref 36.0–46.0)
Hemoglobin: 10.9 g/dL — ABNORMAL LOW (ref 12.0–15.0)
Immature Granulocytes: 0 %
Lymphocytes Relative: 22 %
Lymphs Abs: 1 10*3/uL (ref 0.7–4.0)
MCH: 32.7 pg (ref 26.0–34.0)
MCHC: 31.6 g/dL (ref 30.0–36.0)
MCV: 103.6 fL — ABNORMAL HIGH (ref 80.0–100.0)
Monocytes Absolute: 0.9 10*3/uL (ref 0.1–1.0)
Monocytes Relative: 20 %
Neutro Abs: 2.4 10*3/uL (ref 1.7–7.7)
Neutrophils Relative %: 52 %
Platelets: 273 10*3/uL (ref 150–400)
RBC: 3.33 MIL/uL — ABNORMAL LOW (ref 3.87–5.11)
RDW: 14.6 % (ref 11.5–15.5)
WBC: 4.6 10*3/uL (ref 4.0–10.5)
nRBC: 0 % (ref 0.0–0.2)

## 2021-12-28 LAB — COMPREHENSIVE METABOLIC PANEL
ALT: 11 U/L (ref 0–44)
AST: 14 U/L — ABNORMAL LOW (ref 15–41)
Albumin: 3.7 g/dL (ref 3.5–5.0)
Alkaline Phosphatase: 62 U/L (ref 38–126)
Anion gap: 6 (ref 5–15)
BUN: 16 mg/dL (ref 8–23)
CO2: 28 mmol/L (ref 22–32)
Calcium: 9.1 mg/dL (ref 8.9–10.3)
Chloride: 105 mmol/L (ref 98–111)
Creatinine, Ser: 1.04 mg/dL — ABNORMAL HIGH (ref 0.44–1.00)
GFR, Estimated: 58 mL/min — ABNORMAL LOW (ref >60)
Glucose, Bld: 108 mg/dL — ABNORMAL HIGH (ref 70–99)
Potassium: 3.8 mmol/L (ref 3.5–5.1)
Sodium: 139 mmol/L (ref 135–145)
Total Bilirubin: 0.3 mg/dL (ref 0.3–1.2)
Total Protein: 6.5 g/dL (ref 6.5–8.1)

## 2021-12-28 MED ORDER — SODIUM CHLORIDE 0.9 % IV SOLN
500.0000 mg/m2 | Freq: Once | INTRAVENOUS | Status: AC
  Administered 2021-12-28: 900 mg via INTRAVENOUS
  Filled 2021-12-28: qty 20

## 2021-12-28 MED ORDER — SODIUM CHLORIDE 0.9 % IV SOLN: Freq: Once | INTRAVENOUS | Status: AC

## 2021-12-28 MED ORDER — SODIUM CHLORIDE 0.9 % IV SOLN
10.0000 mg | Freq: Once | INTRAVENOUS | Status: AC
  Administered 2021-12-28: 10 mg via INTRAVENOUS
  Filled 2021-12-28: qty 10

## 2021-12-28 NOTE — Progress Notes (Signed)
?    [facility] ?[phone]   [phone] ? ?OFFICE PROGRESS NOTE ? ?[name], NP ?[address] ?[place] [address] ? ?DIAGNOSIS: Stage IV (T2a, N0, M1b) non-small cell lung cancer, adenocarcinoma with negative EGFR and ALK mutations diagnosed in January 2015 and presented with right upper lobe lung mass in addition to a solitary brain metastasis. ? ?PRIOR THERAPY: ?1) Status post stereotactic radiotherapy to a solitary right parietal brain lesion under the care of Dr. [name] on 10/16/2013. ?2) Status post palliative radiotherapy to the right lung tumor under the care of Dr. [name] completed on 12/05/2013. ?3) Systemic chemotherapy with carboplatin for AUC of 5 and Alimta 500 mg/M2 every 3 weeks. First dose Jan 06 2014. Status post 6 cycles. ? ?CURRENT THERAPY: Systemic chemotherapy with maintenance Alimta 500 MG/M2 every 3 weeks, status post 130 cycles. ? ?INTERVAL HISTORY: ?Alexis [name] 71 y.o. female returns to the clinic today for follow-up visit.  The patient is feeling fine today with no concerning complaints.  She is currently on chronic treatment with antibiotics for urinary tract infection as prescribed by Dr. Jeffie Pollock.  She has no current chest pain, shortness of breath, cough or hemoptysis.  She denied having any nausea, vomiting, diarrhea or constipation.  She has no headache or visual changes.  She denied having any recent weight loss or night sweats.  She continues to tolerate her treatment with maintenance Alimta fairly well.  The patient is here today for evaluation with repeat CT scan of the chest, abdomen and pelvis for restaging of her disease. ? ?MEDICAL HISTORY: ?Past Medical History:  ?Diagnosis Date  ? Anxiety   ? Anxiety 06/20/2016  ? Cervical cancer ([place])   ? Diabetes mellitus without complication ([facility] [facility])   ? patient states she has type 2  ? Encounter for antineoplastic chemotherapy 07/20/2015  ? Malignant neoplasm of right upper lobe of  lung ([place])   ?  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 ? ?ALLERGIES:  is allergic to codeine. ? ?MEDICATIONS:  ?Current Outpatient Medications  ?Medication Sig Dispense Refill  ? acetaminophen (TYLENOL) 500 MG tablet Take 500 mg by mouth every 6 (six) hours as needed for mild pain or headache. Reported on 11/02/2015    ? ALPRAZolam (XANAX) 1 MG tablet Take 1 tablet (1 mg total) by mouth 2 (two) times daily as needed for anxiety. 30 tablet 0  ? Ascorbic Acid (VITAMIN C GUMMIE PO) Take 1 each by mouth every morning.    ? aspirin EC 81 MG tablet Take 1 tablet (81 mg total) by mouth daily. 150 tablet 2  ? [employer] ACID CONTROLLER 10 MG tablet SMARTSIG:1 Tablet(s) By Mouth Every 12 Hours PRN    ? dexamethasone (DECADRON) 4 MG tablet Take 1 tablet twice a day the day before, the day of, and the day after chemotherapy. 40 tablet 2  ? folic acid (FOLVITE) 1 MG tablet TAKE 1 TABLET BY MOUTH EVERY DAY 90 tablet 0  ? loratadine (CLARITIN) 10 MG tablet Take 10 mg by mouth daily.    ? metFORMIN (GLUCOPHAGE) 500 MG tablet TAKE 1 TABLET BY MOUTH TWICE A DAY 60 tablet 1  ? Multiple Vitamin (MULTIVITAMIN WITH MINERALS) TABS tablet Take 1 tablet by mouth every morning.    ? omeprazole (PRILOSEC) 20 MG capsule Take 1 capsule (20 mg total) by mouth daily. 30 capsule 2  ? ondansetron (ZOFRAN) 8 MG tablet TAKE 1 TABLET  BY MOUTH BEFORE CHEMO 30 tablet 0  ? OVER THE COUNTER MEDICATION Take 1 tablet by mouth every morning. (Vitamin A)    ? prochlorperazine (COMPAZINE) 10 MG tablet Take 1 tablet (10 mg total) by mouth every 6 (six) hours as needed for nausea or vomiting. 60 tablet 0  ? rosuvastatin (CRESTOR) 10 MG tablet Take 1 tablet (10 mg total) by mouth daily. 30 tablet 12  ? senna-docusate (SENOKOT-S) 8.6-50 MG tablet Take 1 tablet by mouth daily. 30 tablet prn  ? triamcinolone ointment (KENALOG) 0.5 % Apply 1 application topically 2 (two) times daily. 30 g 1  ? trimethoprim (TRIMPEX) 100 MG tablet Take 100 mg by mouth at  bedtime.    ? valACYclovir (VALTREX) 1000 MG tablet Take 1 tablet (1,000 mg total) by mouth 2 (two) times daily. 14 tablet 0  ? Vitamin D, Ergocalciferol, (DRISDOL) 1.25 MG (50000 UNIT) CAPS capsule Take 50,000 Units by mouth once a week.    ? ?No current facility-administered medications for this visit.  ? ? ?SURGICAL HISTORY:  ?Past Surgical History:  ?Procedure Laterality Date  ? ABDOMINAL HYSTERECTOMY    ? COLOSTOMY TAKEDOWN N/A 07/10/2014  ? Procedure: LAPAROSCOPIC LYSIS OF ADHESIONS (90 MIN) LAPAROSCOPIC ASSISTED COLOSTOMY CLOSURE, RIGID PROCTOSIGMOIDOSCOPY;  Surgeon: Jackolyn Confer, MD;  Location: [facility];  Service: General;  Laterality: N/A;  ? LAPAROTOMY N/A 11/03/2013  ? Procedure: EXPLORATORY LAPAROTOMY, DRAINAGE OF INTRA  ABDOMINAL ABSCESSES, MOBILIZATION OF SPLENIC FLEXURE, SIGMOID COLECTOMY WITH COLOSTOMY;  Surgeon: Odis Hollingshead, MD;  Location: [facility];  Service: General;  Laterality: N/A;  ? VIDEO BRONCHOSCOPY Bilateral 08/30/2013  ? Procedure: VIDEO BRONCHOSCOPY WITH FLUORO;  Surgeon: Tanda Rockers, MD;  Location: [facility];  Service: Cardiopulmonary;  Laterality: Bilateral;  ? ? ?REVIEW OF SYSTEMS:  Constitutional: negative ?Eyes: negative ?Ears, nose, mouth, throat, and face: negative ?Respiratory: negative ?Cardiovascular: negative ?Gastrointestinal: negative ?Genitourinary:negative ?Integument/breast: negative ?Hematologic/lymphatic: negative ?Musculoskeletal:negative ?Neurological: negative ?Behavioral/Psych: negative ?Endocrine: negative ?Allergic/Immunologic: negative  ? ?PHYSICAL EXAMINATION: General appearance: alert, cooperative, and no distress ?Head: Normocephalic, without obvious abnormality, atraumatic ?Neck: no adenopathy, no JVD, supple, symmetrical, trachea midline, and thyroid not enlarged, symmetric, no tenderness/mass/nodules ?Lymph nodes: Cervical, supraclavicular, and axillary nodes normal. ?Resp: clear to auscultation bilaterally ?Back: symmetric, no curvature. ROM normal.  No CVA tenderness. ?Cardio: regular rate and rhythm, S1, S2 normal, no murmur, click, rub or gallop ?GI: soft, non-tender; bowel sounds normal; no masses,  no organomegaly ?Extremities: extremities normal, atraumatic, no cyanosis or edema ?Neurologic: Alert and oriented X 3, normal strength and tone. Normal symmetric reflexes. Normal coordination and gait  ? ?ECOG PERFORMANCE STATUS: 1 - Symptomatic but completely ambulatory  ? ?Blood pressure 122/67, pulse 92, temperature 97.7 ?F (36.5 ?C), temperature source Tympanic, resp. rate 17, weight 158 lb 4 oz (71.8 kg), SpO2 98 %. ? ?LABORATORY DATA: ?Lab Results  ?Component Value Date  ? WBC 4.6 12/28/2021  ? HGB 10.9 (L) 12/28/2021  ? HCT 34.5 (L) 12/28/2021  ? MCV 103.6 (H) 12/28/2021  ? PLT 273 12/28/2021  ? ? ?  Chemistry   ?   ?Component Value Date/Time  ? NA 137 12/06/2021 0804  ? NA 137 08/31/2021 0900  ? NA 139 08/14/2017 0837  ? K 4.2 12/06/2021 0804  ? K 4.4 08/14/2017 0837  ? CL 104 12/06/2021 0804  ? CO2 26 12/06/2021 0804  ? CO2 24 08/14/2017 0837  ? BUN 18 12/06/2021 0804  ? BUN 24 08/31/2021 0900  ? BUN 13.3 08/14/2017 0837  ?  CREATININE 1.19 (H) 12/06/2021 0804  ? CREATININE 0.80 11/16/2021 0805  ? CREATININE 0.8 08/14/2017 0837  ?    ?Component Value Date/Time  ? CALCIUM 9.2 12/06/2021 0804  ? CALCIUM 9.3 08/14/2017 0837  ? ALKPHOS 62 12/06/2021 0804  ? ALKPHOS 107 08/14/2017 0837  ? AST 13 (L) 12/06/2021 0804  ? AST 11 (L) 11/16/2021 0805  ? AST 12 08/14/2017 0837  ? ALT 10 12/06/2021 0804  ? ALT 12 11/16/2021 0805  ? ALT 12 08/14/2017 0837  ? BILITOT 0.3 12/06/2021 0804  ? BILITOT 0.5 11/16/2021 0805  ? BILITOT 0.37 08/14/2017 0837  ?  ? ? ? ?RADIOGRAPHIC STUDIES: ?CT Chest W Contrast ? ?Result Date: 12/21/2021 ?CLINICAL DATA:  Non-small cell lung cancer. Assess for metastasis. History of brain metastasis. Chemotherapy radiation therapy complete. * Tracking Code: BO * EXAM: CT CHEST, ABDOMEN, AND PELVIS WITH CONTRAST TECHNIQUE: Multidetector CT imaging  of the chest, abdomen and pelvis was performed following the standard protocol during bolus administration of intravenous contrast. RADIATION DOSE REDUCTION: This exam was performed according to the d

## 2021-12-29 DIAGNOSIS — N3020 Other chronic cystitis without hematuria: Secondary | ICD-10-CM | POA: Diagnosis not present

## 2022-01-02 ENCOUNTER — Other Ambulatory Visit: Payer: Self-pay | Admitting: Nurse Practitioner

## 2022-01-02 DIAGNOSIS — E119 Type 2 diabetes mellitus without complications: Secondary | ICD-10-CM

## 2022-01-07 ENCOUNTER — Ambulatory Visit
Admission: RE | Admit: 2022-01-07 | Discharge: 2022-01-07 | Disposition: A | Discharge status: Home / Self Care | Payer: Medicare Other | Source: Ambulatory Visit | Attending: Radiation Oncology | Admitting: Radiation Oncology

## 2022-01-07 DIAGNOSIS — G936 Cerebral edema: Secondary | ICD-10-CM | POA: Diagnosis not present

## 2022-01-07 DIAGNOSIS — C7931 Secondary malignant neoplasm of brain: Secondary | ICD-10-CM

## 2022-01-07 DIAGNOSIS — C3490 Malignant neoplasm of unspecified part of unspecified bronchus or lung: Secondary | ICD-10-CM | POA: Diagnosis not present

## 2022-01-07 MED ORDER — GADOBENATE DIMEGLUMINE 529 MG/ML IV SOLN
14.0000 mL | Freq: Once | INTRAVENOUS | Status: AC | PRN
  Administered 2022-01-07: 14 mL via INTRAVENOUS

## 2022-01-10 ENCOUNTER — Ambulatory Visit
Admission: RE | Admit: 2022-01-10 | Discharge: 2022-01-10 | Disposition: A | Discharge status: Home / Self Care | Payer: Medicare Other | Source: Ambulatory Visit | Attending: Radiation Oncology | Admitting: Radiation Oncology

## 2022-01-10 ENCOUNTER — Inpatient Hospital Stay: Payer: Medicare Other

## 2022-01-10 ENCOUNTER — Encounter: Payer: Self-pay | Admitting: Radiation Oncology

## 2022-01-10 DIAGNOSIS — C7931 Secondary malignant neoplasm of brain: Secondary | ICD-10-CM | POA: Diagnosis not present

## 2022-01-10 DIAGNOSIS — C3411 Malignant neoplasm of upper lobe, right bronchus or lung: Secondary | ICD-10-CM

## 2022-01-10 NOTE — Progress Notes (Signed)
Telephone appointment. I verified patient identity and began nursing interview. Patient reports recovering from persistent UTI's, but is otherwise doing well. No other issues reported at this time. ? ?Meaningful use complete. ? ?Reminded patient of her 2:00pm-01/10/22 telephone appointment w/ Shona Simpson PA-C. I left my extension [phone] in case patient needs anything. Patient verbalized understanding of information. ? ?Patient preferred contact [phone] ?

## 2022-01-10 NOTE — Progress Notes (Signed)
?Radiation Oncology         [phone] ? ?Outpatient Follow Up - Conducted via telephone due to current COVID-19 concerns for limiting patient exposure ? ?Our staff spoke with the patient to conduct this consult visit via telephone to spare the patient unnecessary potential exposure in the healthcare setting during the current COVID-19 pandemic. The patient was notified in advance and was offered a [place] meeting to allow for face to face communication but unfortunately reported that they did not have the appropriate resources/technology to support such a visit and instead preferred to proceed with a telephone discussion.  ?________________________________ ? ?Name: Alexis [name] MRN: [id number]  ?Date: 01/10/2022  DOB: 07-16-1951 ? ?CC: [name], NP  [name], MD ? 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 ? ?Prior Radiation:  ? ? ?10/28/2013 through 12/05/2013:   ?The patient was treated to the right lung tumor to a dose of 50 gray in 25 fractions using a 3-D conformal technique. Daily image guidance was used for the patient's treatment. ? ?10/16/2013 SRS Treatment: ?PTV1: Rt Parietal 44mm target was treated using 3 Arcs to a prescription dose of 20 Gy. ExacTrac Snap verification was performed for each couch angle. ? ?Narrative:    In summary this is a 71 y.o. patient with a history of metastatic lung cancer to the brain who was treated in two sessions in 2015 for her brain disease, followed by local control to the right lung.  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 She is no longer taking these medications.  She remains on maintenance Alimta with Dr. Julien Nordmann every 3 weeks and has had a total of 131 cycles.  ? ?Her last MRI brain on 01/06/2022 showed stable changes in the right parietal lesion that has previously been  radiated.  No evidence of growth or new lesions were seen.  Mild vasogenic edema persists but is felt to be again stable.  She continues to have partial bilateral mastoid and right sphenoid sinus opacification.  She is contacted today to review these results. ? ? ?ROS: ?The patient is doing well overall.  She is currently having to receive treatment for multiple urinary tract infections and is followed under the care of Dr. Jeffie Pollock in [facility] urology.  She states that she is not having any headaches blurred vision double vision changes in hearing speech or movement.  No other complaints are verbalized. ? ?Past Medical History:  ?Past Medical History:  ?Diagnosis Date  ? Anxiety   ? Anxiety 06/20/2016  ? Cervical cancer ([place])   ? Diabetes mellitus without complication ([place])   ? patient states she has type 2  ? Encounter for antineoplastic chemotherapy 07/20/2015  ? Malignant neoplasm of right upper lobe of lung ([place])   ?  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 ? ?Past Surgical History: ?Past Surgical History:  ?Procedure Laterality Date  ? ABDOMINAL HYSTERECTOMY    ? COLOSTOMY TAKEDOWN N/A 07/10/2014  ? Procedure: LAPAROSCOPIC LYSIS OF ADHESIONS (90 MIN) LAPAROSCOPIC ASSISTED COLOSTOMY CLOSURE, RIGID PROCTOSIGMOIDOSCOPY;  Surgeon: Jackolyn Confer, MD;  Location: [facility] OR[facility];  Service: General;  Laterality: N/A;  ? LAPAROTOMY N/A 11/03/2013  ? Procedure: EXPLORATORY LAPAROTOMY, DRAINAGE OF INTRA  ABDOMINAL ABSCESSES, MOBILIZATION OF SPLENIC FLEXURE, SIGMOID COLECTOMY WITH COLOSTOMY;  Surgeon: Odis Hollingshead, MD;  Location: [facility];  Service: General;  Laterality: N/A;  ? VIDEO BRONCHOSCOPY Bilateral 08/30/2013  ? Procedure: VIDEO BRONCHOSCOPY WITH FLUORO;  Surgeon: Tanda Rockers, MD;  Location: [facility];  Service: Cardiopulmonary;  Laterality: Bilateral;  ? ? ?Social History:  ?Social History  ? ?Socioeconomic History  ? Marital status: Married  ?  Spouse name: Not on file  ? Number of children: Not  on file  ? Years of education: Not on file  ? Highest education level: Not on file  ?Occupational History  ? 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 Smoking status: Former  ?  Packs/day: 1.00  ?  Years: 40.00  ?  Pack years: 40.00  ?  Types: Cigarettes  ?  Quit date: 09/27/2013  ?  Years since quitting: 8.2  ? Smokeless tobacco: Never  ?Vaping Use  ? Vaping Use: Never used  ?Substance and Sexual Activity  ? Alcohol use: No  ? Drug use: No  ? Sexual activity: Yes  ?Other Topics Concern  ? Not on file  ?Social History Narrative  ? Not on file  ? ?Social Determinants of Health  ? ?Financial Resource Strain: Not on file  ?Food Insecurity: Not on file  ?Transportation Needs: Not on file  ?Physical Activity: Not on file  ?Stress: Not on file  ?Social Connections: Not on file  ?Intimate Partner Violence: Not on file  ?The patient is married. She lives in [place], [place]. ? ?Family History: ?Family History  ?Problem Relation Age of Onset  ? Emphysema Father   ?     smoked  ? Lung cancer Father   ?     smoked  ? Cancer Mother   ? Hypertension Mother   ? COPD Mother   ? ? ?ALLERGIES:  is allergic to codeine. ? ?Meds: ?Current Outpatient Medications  ?Medication Sig Dispense Refill  ? acetaminophen (TYLENOL) 500 MG tablet Take 500 mg by mouth every 6 (six) hours as needed for mild pain or headache. Reported on 11/02/2015    ? ALPRAZolam (XANAX) 1 MG tablet Take 1 tablet (1 mg total) by mouth 2 (two) times daily as needed for anxiety. 30 tablet 0  ? Ascorbic Acid (VITAMIN C GUMMIE PO) Take 1 each by mouth every morning.    ? aspirin EC 81 MG tablet Take 1 tablet (81 mg total) by mouth daily. 150 tablet 2  ? [employer] ACID CONTROLLER 10 MG tablet SMARTSIG:1 Tablet(s) By Mouth Every 12 Hours PRN    ? dexamethasone (DECADRON) 4 MG tablet Take 1 tablet twice a day the day before, the day of, and the day after chemotherapy. 40 tablet 2  ? folic acid (FOLVITE) 1 MG tablet TAKE 1 TABLET BY MOUTH EVERY DAY 90  tablet 0  ? loratadine (CLARITIN) 10 MG tablet Take 10 mg by mouth daily.    ? metFORMIN (GLUCOPHAGE) 500 MG tablet TAKE 1 TABLET BY MOUTH TWICE A DAY 60 tablet 1  ? Multiple Vitamin (MULTIVITAMIN WITH MINERALS) TABS tablet Take 1 tablet by mouth every morning.    ? omeprazole (PRILOSEC) 20 MG capsule Take 1 capsule (20 mg total) by mouth daily. 30 capsule 2  ? ondansetron (ZOFRAN) 8 MG tablet TAKE 1 TABLET BY MOUTH BEFORE CHEMO 30 tablet 0  ? OVER THE COUNTER MEDICATION Take 1 tablet by mouth every morning. (Vitamin A)    ? prochlorperazine (COMPAZINE) 10 MG tablet Take 1 tablet (10 mg total) by mouth every 6 (six) hours as  needed for nausea or vomiting. 60 tablet 0  ? rosuvastatin (CRESTOR) 10 MG tablet Take 1 tablet (10 mg total) by mouth daily. 30 tablet 12  ? senna-docusate (SENOKOT-S) 8.6-50 MG tablet Take 1 tablet by mouth daily. 30 tablet prn  ? triamcinolone ointment (KENALOG) 0.5 % Apply 1 application topically 2 (two) times daily. 30 g 1  ? trimethoprim (TRIMPEX) 100 MG tablet Take 100 mg by mouth at bedtime.    ? valACYclovir (VALTREX) 1000 MG tablet Take 1 tablet (1,000 mg total) by mouth 2 (two) times daily. 14 tablet 0  ? Vitamin D, Ergocalciferol, (DRISDOL) 1.25 MG (50000 UNIT) CAPS capsule Take 50,000 Units by mouth once a week.    ? ?No current facility-administered medications for this encounter.  ? ? ?Physical Findings: ?Unable to assess due to type of encounter ? ?Lab Findings: ?Lab Results  ?Component Value Date  ? WBC 4.6 12/28/2021  ? HGB 10.9 (L) 12/28/2021  ? HCT 34.5 (L) 12/28/2021  ? MCV 103.6 (H) 12/28/2021  ? PLT 273 12/28/2021  ? ? ? ?Radiographic Findings: ?CT Chest W Contrast ? ?Result Date: 12/21/2021 ?CLINICAL DATA:  Non-small cell lung cancer. Assess for metastasis. History of brain metastasis. Chemotherapy radiation therapy complete. * Tracking Code: BO * EXAM: CT CHEST, ABDOMEN, AND PELVIS WITH CONTRAST TECHNIQUE: Multidetector CT imaging of the chest, abdomen and pelvis was  performed following the standard protocol during bolus administration of intravenous contrast. RADIATION DOSE REDUCTION: This exam was performed according to the departmental dose-optimization program which inc

## 2022-01-11 NOTE — Progress Notes (Unsigned)
[facility] OFFICE PROGRESS NOTE  Alexis [place]water, [place] [id number]  DIAGNOSIS: Stage IV (T2a, N0, M1b) non-small cell lung cancer, adenocarcinoma with negative EGFR and ALK mutations diagnosed in January 2015 and presented with right upper lobe lung mass in addition to a solitary brain metastasis  PRIOR THERAPY: 1) Status post stereotactic radiotherapy to a solitary right parietal brain lesion under the care of Dr. Lisbeth [name] on 10/16/2013. 2) Status post palliative radiotherapy to the right lung tumor under the care of Dr. Lisbeth [name] completed on 12/05/2013. 3) Systemic chemotherapy with carboplatin for AUC of 5 and Alimta 500 mg/M2 every 3 weeks. First dose Jan 06 2014. Status post 6 cycles.  CURRENT THERAPY: Systemic chemotherapy with maintenance Alimta 500 MG/M2 every 3 weeks, status post 131 cycles.  INTERVAL HISTORY: Alexis [name] 71 y.o. female returns to the clinic today for a follow-up visit.  The patient is feeling fairly well today without any concerning complaints.  In the interval since last being seen, she had a repeat brain MRI and saw a radiation oncology to review the results of this.  This was negative for any metastatic disease to the brain. She also saw her PCP yesterday to follow up on her diabetes management. Otherwise, she is currently undergoing single agent chemotherapy with Alimta.  She denies any fever, chills, night sweats, or unexplained weight loss.  She denies any chest pain, cough, or hemoptysis.  She denies any significant or worsening shortness of breath with exertion except for mild shortness of breath with certain activities.  Denies any nausea, vomiting, diarrhea, or constipation.  She sometimes has intermittent diarrhea which is normal for her in which she will use Imodium if needed.  Denies any headache or visual changes.  She sometimes has recurrent UTIs for which she is followed closely by Dr. Jeffie [name] from urology. She is  here today for evaluation and repeat blood work before starting cycle 132.    MEDICAL HISTORY: Past Medical History:  Diagnosis Date   Anxiety    Anxiety 06/20/2016   Cervical cancer ([place])    Diabetes mellitus without complication ([place])    patient states she has type 2   Encounter for antineoplastic chemotherapy 07/20/2015   Malignant neoplasm of right upper lobe of lung (HCC)     non small cell lung cancer adenocarcioma with brain meta    ALLERGIES:  is allergic to codeine.  MEDICATIONS:  Current Outpatient Medications  Medication Sig Dispense Refill   acetaminophen (TYLENOL) 500 MG tablet Take 500 mg by mouth every 6 (six) hours as needed for mild pain or headache. Reported on 11/02/2015     ALPRAZolam (XANAX) 1 MG tablet Take 0.5 tablets (0.5 mg total) by mouth 2 (two) times daily as needed for anxiety. 30 tablet 2   Ascorbic Acid (VITAMIN C GUMMIE PO) Take 1 each by mouth every morning.     aspirin EC 81 MG tablet Take 1 tablet (81 mg total) by mouth daily. 150 tablet 2   [employer] ACID CONTROLLER 10 MG tablet SMARTSIG:1 Tablet(s) By Mouth Every 12 Hours PRN     dexamethasone (DECADRON) 4 MG tablet Take 1 tablet twice a day the day before, the day of, and the day after chemotherapy. 40 tablet 2   folic acid (FOLVITE) 1 MG tablet TAKE 1 TABLET BY MOUTH EVERY DAY 90 tablet 0   loratadine (CLARITIN) 10 MG tablet Take 10 mg by mouth daily.  metFORMIN (GLUCOPHAGE) 500 MG tablet TAKE 1 TABLET BY MOUTH TWICE A DAY 60 tablet 1   Multiple Vitamin (MULTIVITAMIN WITH MINERALS) TABS tablet Take 1 tablet by mouth every morning.     omeprazole (PRILOSEC) 20 MG capsule Take 1 capsule (20 mg total) by mouth daily. 30 capsule 2   ondansetron (ZOFRAN) 8 MG tablet TAKE 1 TABLET BY MOUTH BEFORE CHEMO 30 tablet 0   OVER THE COUNTER MEDICATION Take 1 tablet by mouth every morning. (Vitamin A)     prochlorperazine (COMPAZINE) 10 MG tablet Take 1 tablet (10 mg total) by mouth every 6 (six) hours as needed  for nausea or vomiting. 60 tablet 0   rosuvastatin (CRESTOR) 10 MG tablet Take 1 tablet (10 mg total) by mouth daily. 30 tablet 12   senna-docusate (SENOKOT-S) 8.6-50 MG tablet Take 1 tablet by mouth daily. 30 tablet prn   triamcinolone ointment (KENALOG) 0.5 % Apply 1 application topically 2 (two) times daily. 30 g 1   trimethoprim (TRIMPEX) 100 MG tablet Take 100 mg by mouth at bedtime.     valACYclovir (VALTREX) 1000 MG tablet Take 1 tablet (1,000 mg total) by mouth 2 (two) times daily. 14 tablet 0   Vitamin D, Ergocalciferol, (DRISDOL) 1.25 MG (50000 UNIT) CAPS capsule Take 50,000 Units by mouth once a week.     No current facility-administered medications for this visit.    SURGICAL HISTORY:  Past Surgical History:  Procedure Laterality Date   ABDOMINAL HYSTERECTOMY     COLOSTOMY TAKEDOWN N/A 07/10/2014   Procedure: LAPAROSCOPIC LYSIS OF ADHESIONS (90 MIN) LAPAROSCOPIC ASSISTED COLOSTOMY CLOSURE, RIGID PROCTOSIGMOIDOSCOPY;  Surgeon: Jackolyn Confer, MD;  Location: [facility];  Service: General;  Laterality: N/A;   LAPAROTOMY N/A 11/03/2013   Procedure: EXPLORATORY LAPAROTOMY, DRAINAGE OF INTRA  ABDOMINAL ABSCESSES, MOBILIZATION OF SPLENIC FLEXURE, SIGMOID COLECTOMY WITH COLOSTOMY;  Surgeon: Odis Hollingshead, MD;  Location: [facility];  Service: General;  Laterality: N/A;   VIDEO BRONCHOSCOPY Bilateral 08/30/2013   Procedure: VIDEO BRONCHOSCOPY WITH FLUORO;  Surgeon: Tanda Rockers, MD;  Location: [facility];  Service: Cardiopulmonary;  Laterality: Bilateral;    REVIEW OF SYSTEMS:   Constitutional: Negative for appetite change, chills, fatigue, fever and unexpected weight change.  HENT: Negative for mouth sores, nosebleeds, sore throat and trouble swallowing.   Eyes: Negative for eye problems and icterus.  Respiratory: Negative for cough, hemoptysis, shortness of breath and wheezing.   Cardiovascular: Negative for chest pain and leg swelling.  Gastrointestinal: Positive for baseline  intermittent diarrhea. Negative for abdominal pain, constipation, nausea and vomiting.  Genitourinary: Negative for bladder incontinence, difficulty urinating, dysuria (resolved), frequency and hematuria.   Musculoskeletal: Negative for back pain, gait problem, neck pain and neck stiffness.  Skin: Negative for itching and rash.  Neurological: Negative for dizziness, extremity weakness, gait problem, headaches, light-headedness and seizures.  Hematological: Negative for adenopathy. Does not bruise/bleed easily.  Psychiatric/Behavioral: Negative for confusion, depression and sleep disturbance. The patient is not nervous/anxious.   PHYSICAL EXAMINATION:  Blood pressure (!) 123/52, pulse 65, temperature 97.7 F (36.5 C), temperature source Oral, resp. rate 19, height _0  (1.626 m), weight 157 lb 11.2 oz (71.5 kg), SpO2 98 %.  ECOG PERFORMANCE STATUS: 1  Physical Exam  Constitutional: Oriented to person, place, and time and well-developed, well-nourished, and in no distress. No distress.  HENT:  Head: Normocephalic and atraumatic.  Mouth/Throat: Oropharynx is clear and moist. No oropharyngeal exudate.  Eyes: Conjunctivae are normal. Right eye exhibits no discharge. Left  eye exhibits no discharge. No scleral icterus.  Neck: Normal range of motion. Neck supple.  Cardiovascular: Normal rate, regular rhythm, normal heart sounds and intact distal pulses.   Pulmonary/Chest: Effort normal and breath sounds normal. No respiratory distress. No wheezes. No rales.  Abdominal: Soft. Bowel sounds are normal. Exhibits no distension and no mass. There is no tenderness.  Musculoskeletal: Normal range of motion. Exhibits no edema.  Lymphadenopathy:    No cervical adenopathy.  Neurological: Alert and oriented to person, place, and time. Exhibits normal muscle tone. Gait normal. Coordination normal.  Skin: Skin is warm and dry. No rash noted. Not diaphoretic. No erythema. No pallor.  Psychiatric: Mood,  memory and judgment normal.  Vitals reviewed.  LABORATORY DATA: Lab Results  Component Value Date   WBC 6.9 01/18/2022   HGB 11.1 (L) 01/18/2022   HCT 33.7 (L) 01/18/2022   MCV 102.1 (H) 01/18/2022   PLT 288 01/18/2022      Chemistry      Component Value Date/Time   NA 138 01/18/2022 1110   NA 137 08/31/2021 0900   NA 139 08/14/2017 0837   K 3.9 01/18/2022 1110   K 4.4 08/14/2017 0837   CL 107 01/18/2022 1110   CO2 25 01/18/2022 1110   CO2 24 08/14/2017 0837   BUN 20 01/18/2022 1110   BUN 24 08/31/2021 0900   BUN 13.3 08/14/2017 0837   CREATININE 0.89 01/18/2022 1110   CREATININE 0.80 11/16/2021 0805   CREATININE 0.8 08/14/2017 0837      Component Value Date/Time   CALCIUM 9.4 01/18/2022 1110   CALCIUM 9.3 08/14/2017 0837   ALKPHOS 59 01/18/2022 1110   ALKPHOS 107 08/14/2017 0837   AST 13 (L) 01/18/2022 1110   AST 11 (L) 11/16/2021 0805   AST 12 08/14/2017 0837   ALT 10 01/18/2022 1110   ALT 12 11/16/2021 0805   ALT 12 08/14/2017 0837   BILITOT 0.4 01/18/2022 1110   BILITOT 0.5 11/16/2021 0805   BILITOT 0.37 08/14/2017 0837       RADIOGRAPHIC STUDIES:  CT Chest W Contrast  Result Date: 12/21/2021 CLINICAL DATA:  Non-small cell lung cancer. Assess for metastasis. History of brain metastasis. Chemotherapy radiation therapy complete. * Tracking Code: BO * EXAM: CT CHEST, ABDOMEN, AND PELVIS WITH CONTRAST TECHNIQUE: Multidetector CT imaging of the chest, abdomen and pelvis was performed following the standard protocol during bolus administration of intravenous contrast. RADIATION DOSE REDUCTION: This exam was performed according to the departmental dose-optimization program which includes automated exposure control, adjustment of the mA and/or kV according to patient size and/or use of iterative reconstruction technique. CONTRAST:  125m ISOVUE-300 IOPAMIDOL (ISOVUE-300) INJECTION 61% COMPARISON:  None. FINDINGS: CT CHEST FINDINGS Cardiovascular: No significant  vascular findings. Normal heart size. No pericardial effusion. Mediastinum/Nodes: No axillary or supraclavicular adenopathy. No mediastinal or hilar adenopathy. No pericardial fluid. Esophagus normal. Lungs/Pleura: Consolidation in the medial RIGHT upper lobe with mild bronchiectasis centrally is unchanged from prior and most suggestive of post radiation change. No new nodularity or interval growth. Ground-glass nodule in the RIGHT lower lobe measuring 7 mm (image 100/6) is unchanged. Several small ground-glass nodule RIGHT lower lobe are also unchanged. Angular scarring at the LEFT lung apex is unchanged. Musculoskeletal: No aggressive osseous lesion. CT ABDOMEN AND PELVIS FINDINGS Hepatobiliary: No focal hepatic lesion. No biliary ductal dilatation. Gallbladder is normal. Common bile duct is normal. Pancreas: Pancreas is normal. No ductal dilatation. No pancreatic inflammation. Spleen: Normal spleen Adrenals/urinary tract: Adrenal glands and  kidneys are normal. The ureters and bladder normal. Stomach/Bowel: Stomach, small bowel, appendix, and cecum are normal. The colon and rectosigmoid colon are normal. Mid rectal anastomosis noted. Vascular/Lymphatic: Abdominal aorta is normal caliber. There is no retroperitoneal or periportal lymphadenopathy. No pelvic lymphadenopathy. Reproductive: Post hysterectomy.  Adnexa unremarkable Other: No free fluid. Musculoskeletal: No aggressive osseous lesion. IMPRESSION: Chest Impression: 1. No evidence of lung cancer recurrence in thorax. 2. Stable post radiation change in the RIGHT upper lobe. No interval growth nodularity. 3. Stable ground-glass nodule in the RIGHT lower lobe. Abdomen / Pelvis Impression: 1. No evidence of metastatic disease in the abdomen pelvis. 2. Stable incidental findings. Electronically Signed   By: Suzy Bouchard M.D.   On: 12/21/2021 21:46   MR Brain W Wo Contrast  Result Date: 01/08/2022 CLINICAL DATA:  Brain metastases.  Follow-up lung cancer.  EXAM: MRI HEAD WITHOUT AND WITH CONTRAST TECHNIQUE: Multiplanar, multiecho pulse sequences of the brain and surrounding structures were obtained without and with intravenous contrast. CONTRAST:  36m MULTIHANCE GADOBENATE DIMEGLUMINE 529 MG/ML IV SOLN COMPARISON:  07/08/2021 FINDINGS: Brain: Enhancing right parietal lesion measures 16 mm with unchanged heterogeneity and regional vasogenic edema. No new or progressive disease. Mild chronic small vessel ischemic change in the hemispheric white matter. No acute infarct, hydrocephalus, or collection. Vascular: Preserved flow voids and vascular enhancements Skull and upper cervical spine: Normal marrow signal Sinuses/Orbits: Partial bilateral mastoid and right sphenoid sinus opacification. IMPRESSION: Size stable right parietal lesion with mild vasogenic edema. No new or progressive disease. Electronically Signed   By: JJorje GuildM.D.   On: 01/08/2022 12:14   CT Abdomen Pelvis W Contrast  Result Date: 12/21/2021 CLINICAL DATA:  Non-small cell lung cancer. Assess for metastasis. History of brain metastasis. Chemotherapy radiation therapy complete. * Tracking Code: BO * EXAM: CT CHEST, ABDOMEN, AND PELVIS WITH CONTRAST TECHNIQUE: Multidetector CT imaging of the chest, abdomen and pelvis was performed following the standard protocol during bolus administration of intravenous contrast. RADIATION DOSE REDUCTION: This exam was performed according to the departmental dose-optimization program which includes automated exposure control, adjustment of the mA and/or kV according to patient size and/or use of iterative reconstruction technique. CONTRAST:  1051mISOVUE-300 IOPAMIDOL (ISOVUE-300) INJECTION 61% COMPARISON:  None. FINDINGS: CT CHEST FINDINGS Cardiovascular: No significant vascular findings. Normal heart size. No pericardial effusion. Mediastinum/Nodes: No axillary or supraclavicular adenopathy. No mediastinal or hilar adenopathy. No pericardial fluid. Esophagus  normal. Lungs/Pleura: Consolidation in the medial RIGHT upper lobe with mild bronchiectasis centrally is unchanged from prior and most suggestive of post radiation change. No new nodularity or interval growth. Ground-glass nodule in the RIGHT lower lobe measuring 7 mm (image 100/6) is unchanged. Several small ground-glass nodule RIGHT lower lobe are also unchanged. Angular scarring at the LEFT lung apex is unchanged. Musculoskeletal: No aggressive osseous lesion. CT ABDOMEN AND PELVIS FINDINGS Hepatobiliary: No focal hepatic lesion. No biliary ductal dilatation. Gallbladder is normal. Common bile duct is normal. Pancreas: Pancreas is normal. No ductal dilatation. No pancreatic inflammation. Spleen: Normal spleen Adrenals/urinary tract: Adrenal glands and kidneys are normal. The ureters and bladder normal. Stomach/Bowel: Stomach, small bowel, appendix, and cecum are normal. The colon and rectosigmoid colon are normal. Mid rectal anastomosis noted. Vascular/Lymphatic: Abdominal aorta is normal caliber. There is no retroperitoneal or periportal lymphadenopathy. No pelvic lymphadenopathy. Reproductive: Post hysterectomy.  Adnexa unremarkable Other: No free fluid. Musculoskeletal: No aggressive osseous lesion. IMPRESSION: Chest Impression: 1. No evidence of lung cancer recurrence in thorax. 2. Stable post radiation change  in the RIGHT upper lobe. No interval growth nodularity. 3. Stable ground-glass nodule in the RIGHT lower lobe. Abdomen / Pelvis Impression: 1. No evidence of metastatic disease in the abdomen pelvis. 2. Stable incidental findings. Electronically Signed   By: Suzy Bouchard M.D.   On: 12/21/2021 21:46     ASSESSMENT/PLAN:  This is a very pleasant 71 year old Caucasian female with stage IV non-small cell lung cancer, adenocarcinoma who presented with a right upper lobe lung mass in addition to a solitary brain metastatsis.  She was diagnosed in January 2015.  The patient had completed induction  systemic chemotherapy with carboplatin and Alimta with a partial response. The patient is currently being treated with single agent maintenance Alimta.  She is status post 131 cycles.    Labs were reviewed.  She is ok to proceed with #132 today scheduled.  We will see her back for follow-up visit in 3 weeks for evaluation before starting cycle #133    The patient was advised to call immediately if she has any concerning symptoms in the interval. The patient voices understanding of current disease status and treatment options and is in agreement with the current care plan. All questions were answered. The patient knows to call the clinic with any problems, questions or concerns. We can certainly see the patient much sooner if necessary          No orders of the defined types were placed in this encounter.    The total time spent in the appointment was 20-29 minutes.   Miangel Flom L Erique Kaser, PA-C 01/18/22

## 2022-01-14 ENCOUNTER — Telehealth: Payer: Self-pay | Admitting: Physician Assistant

## 2022-01-14 NOTE — Telephone Encounter (Signed)
Called patient regarding upcoming appointment, patient is notified. °

## 2022-01-17 ENCOUNTER — Encounter: Payer: Self-pay | Admitting: Nurse Practitioner

## 2022-01-17 ENCOUNTER — Ambulatory Visit (INDEPENDENT_AMBULATORY_CARE_PROVIDER_SITE_OTHER): Payer: Medicare Other | Admitting: Nurse Practitioner

## 2022-01-17 VITALS — BP 118/67 | HR 88 | Temp 98.0°F | Ht 64.0 in | Wt 155.1 lb

## 2022-01-17 DIAGNOSIS — E785 Hyperlipidemia, unspecified: Secondary | ICD-10-CM | POA: Diagnosis not present

## 2022-01-17 DIAGNOSIS — E119 Type 2 diabetes mellitus without complications: Secondary | ICD-10-CM

## 2022-01-17 DIAGNOSIS — E1169 Type 2 diabetes mellitus with other specified complication: Secondary | ICD-10-CM | POA: Diagnosis not present

## 2022-01-17 DIAGNOSIS — F411 Generalized anxiety disorder: Secondary | ICD-10-CM

## 2022-01-17 DIAGNOSIS — C3411 Malignant neoplasm of upper lobe, right bronchus or lung: Secondary | ICD-10-CM | POA: Diagnosis not present

## 2022-01-17 LAB — POCT GLYCOSYLATED HEMOGLOBIN (HGB A1C): Hemoglobin A1C: 5.7 % — AB (ref 4.0–5.6)

## 2022-01-17 MED ORDER — ALPRAZOLAM 1 MG PO TABS: 0.5000 mg | ORAL_TABLET | Freq: Two times a day (BID) | ORAL | 2 refills | Status: DC | PRN

## 2022-01-17 MED FILL — Dexamethasone Sodium Phosphate Inj 100 MG/10ML: INTRAMUSCULAR | Qty: 1 | Status: AC

## 2022-01-17 NOTE — Progress Notes (Signed)
Established patient visit   Patient: Alexis [name]   DOB: Sep 18, 1950   71 y.o. Female  MRN: [id number] Visit Date: 01/17/2022   Chief Complaint  Patient presents with   Follow-up   Subjective    HPI  Patient presenting for follow up visit.  -wanting refills of alprazolam  --pdmp profile reviewed 01/17/2022  --overdose risk score is 110 --last fill of alprazolam 12/16/2021 -needs check of HgbA1c. Last check 08/31/2021 at 6.1. today, her HgbA1c is 5.7 -urine microalbumin checked 09/06/2021 and was normal  -undergoes treatment for primary cancer of right lung - has had 132 chemotherapy treatments. She goes every three weeks for treatments.     Medications: Outpatient Medications Prior to Visit  Medication Sig   acetaminophen (TYLENOL) 500 MG tablet Take 500 mg by mouth every 6 (six) hours as needed for mild pain or headache. Reported on 11/02/2015   Ascorbic Acid (VITAMIN C GUMMIE PO) Take 1 each by mouth every morning.   aspirin EC 81 MG tablet Take 1 tablet (81 mg total) by mouth daily.   [employer] ACID CONTROLLER 10 MG tablet SMARTSIG:1 Tablet(s) By Mouth Every 12 Hours PRN   dexamethasone (DECADRON) 4 MG tablet Take 1 tablet twice a day the day before, the day of, and the day after chemotherapy.   folic acid (FOLVITE) 1 MG tablet TAKE 1 TABLET BY MOUTH EVERY DAY   loratadine (CLARITIN) 10 MG tablet Take 10 mg by mouth daily.   metFORMIN (GLUCOPHAGE) 500 MG tablet TAKE 1 TABLET BY MOUTH TWICE A DAY   Multiple Vitamin (MULTIVITAMIN WITH MINERALS) TABS tablet Take 1 tablet by mouth every morning.   omeprazole (PRILOSEC) 20 MG capsule Take 1 capsule (20 mg total) by mouth daily.   ondansetron (ZOFRAN) 8 MG tablet TAKE 1 TABLET BY MOUTH BEFORE CHEMO   OVER THE COUNTER MEDICATION Take 1 tablet by mouth every morning. (Vitamin A)   prochlorperazine (COMPAZINE) 10 MG tablet Take 1 tablet (10 mg total) by mouth every 6 (six) hours as needed for nausea or vomiting.   rosuvastatin (CRESTOR) 10 MG  tablet Take 1 tablet (10 mg total) by mouth daily.   senna-docusate (SENOKOT-S) 8.6-50 MG tablet Take 1 tablet by mouth daily.   triamcinolone ointment (KENALOG) 0.5 % Apply 1 application topically 2 (two) times daily.   trimethoprim (TRIMPEX) 100 MG tablet Take 100 mg by mouth at bedtime.   valACYclovir (VALTREX) 1000 MG tablet Take 1 tablet (1,000 mg total) by mouth 2 (two) times daily.   Vitamin D, Ergocalciferol, (DRISDOL) 1.25 MG (50000 UNIT) CAPS capsule Take 50,000 Units by mouth once a week.   [DISCONTINUED] ALPRAZolam (XANAX) 1 MG tablet Take 1 tablet (1 mg total) by mouth 2 (two) times daily as needed for anxiety.   No facility-administered medications prior to visit.    Review of Systems  Constitutional:  Negative for activity change, appetite change, chills, fatigue and fever.  HENT:  Negative for congestion, postnasal drip, rhinorrhea, sinus pressure, sinus pain, sneezing and sore throat.   Eyes: Negative.   Respiratory:  Negative for cough, chest tightness, shortness of breath and wheezing.   Cardiovascular:  Negative for chest pain and palpitations.  Gastrointestinal:  Negative for abdominal pain, constipation, diarrhea, nausea and vomiting.  Endocrine: Negative for cold intolerance, heat intolerance, polydipsia and polyuria.       Blood sugars doing well    Genitourinary:  Negative for dyspareunia, dysuria, flank pain, frequency and urgency.  Musculoskeletal:  Negative for arthralgias, back  pain and myalgias.  Skin:  Negative for rash.  Allergic/Immunologic: Negative for environmental allergies.  Neurological:  Negative for dizziness, weakness and headaches.  Hematological:  Negative for adenopathy.  Psychiatric/Behavioral:  Positive for dysphoric mood. The patient is nervous/anxious.      Objective     Today's Vitals   01/17/22 1315  BP: 118/67  Pulse: 88  Temp: 98 F (36.7 C)  SpO2: 98%  Weight: 155 lb 1.9 oz (70.4 kg)  Height: 5\' 4"  (1.626 m)   Body mass  index is 26.63 kg/m.   BP Readings from Last 3 Encounters:  01/17/22 118/67  12/28/21 122/67  12/06/21 (!) 127/46    Wt Readings from Last 3 Encounters:  01/17/22 155 lb 1.9 oz (70.4 kg)  12/28/21 158 lb 4 oz (71.8 kg)  12/06/21 156 lb 2 oz (70.8 kg)    Physical Exam Vitals and nursing note reviewed.  Constitutional:      Appearance: Normal appearance. She is well-developed.  HENT:     Head: Normocephalic and atraumatic.  Eyes:     Pupils: Pupils are equal, round, and reactive to light.  Neck:     Vascular: No carotid bruit.  Cardiovascular:     Rate and Rhythm: Normal rate and regular rhythm.     Pulses: Normal pulses.     Heart sounds: Normal heart sounds.  Pulmonary:     Effort: Pulmonary effort is normal.     Breath sounds: Normal breath sounds.  Abdominal:     Palpations: Abdomen is soft.  Musculoskeletal:        General: Normal range of motion.     Cervical back: Normal range of motion and neck supple.  Lymphadenopathy:     Cervical: No cervical adenopathy.  Skin:    General: Skin is warm and dry.     Capillary Refill: Capillary refill takes less than 2 seconds.  Neurological:     General: No focal deficit present.     Mental Status: She is alert and oriented to person, place, and time.  Psychiatric:        Attention and Perception: Attention and perception normal.        Mood and Affect: Affect normal. Mood is anxious.        Speech: Speech normal.        Behavior: Behavior normal.        Thought Content: Thought content normal.        Cognition and Memory: Cognition and memory normal.        Judgment: Judgment normal.      Results for orders placed or performed in visit on 01/17/22  POCT glycosylated hemoglobin (Hb A1C)  Result Value Ref Range   Hemoglobin A1C 5.7 (A) 4.0 - 5.6 %   HbA1c POC (<> result, manual entry)     HbA1c, POC (prediabetic range)     HbA1c, POC (controlled diabetic range)      Assessment & Plan    1. Diabetes mellitus  without complication (HCC) MVHQ4O 5.7 today. Conitnue metformin as prescribed. Refer for diabetic eye exam.  - POCT glycosylated hemoglobin (Hb A1C) - Ambulatory referral to Ophthalmology  2. Hyperlipidemia due to type 2 diabetes mellitus ([place]) Patient should continue rosuvastatin as prescribed.   3. Generalized anxiety disorder Patient may take alprazolam 1mg  - taking 1/2 tablet up to two times daily as needed for acute anxiety. New prescription sent to her pharmacy.  - ALPRAZolam Duanne Moron) 1 MG tablet; Take 0.5 tablets (0.5  mg total) by mouth 2 (two) times daily as needed for anxiety.  Dispense: 30 tablet; Refill: 2  4. Primary cancer of right upper lobe of lung ([place]) Continue regular vists with oncology as scheduled.     Problem List Items Addressed This Visit       Respiratory   Primary cancer of right upper lobe of lung ([place])   Relevant Medications   ALPRAZolam (XANAX) 1 MG tablet     Endocrine   Diabetes mellitus without complication ([place]) - Primary   Relevant Orders   POCT glycosylated hemoglobin (Hb A1C) (Completed)   Ambulatory referral to Ophthalmology   Hyperlipidemia due to type 2 diabetes mellitus ([place])     Other   Generalized anxiety disorder   Relevant Medications   ALPRAZolam (XANAX) 1 MG tablet     Return in about 3 months (around 04/19/2022) for diabetes with HgbA1c check, mood.         [name], NP  [facility] [phone] (phone) [phone] (fax)  [place]

## 2022-01-18 ENCOUNTER — Inpatient Hospital Stay: Payer: Medicare Other

## 2022-01-18 ENCOUNTER — Inpatient Hospital Stay (HOSPITAL_BASED_OUTPATIENT_CLINIC_OR_DEPARTMENT_OTHER): Payer: Medicare Other | Admitting: Physician Assistant

## 2022-01-18 VITALS — BP 123/52 | HR 65 | Temp 97.7°F | Resp 19 | Ht 64.0 in | Wt 157.7 lb

## 2022-01-18 DIAGNOSIS — C7931 Secondary malignant neoplasm of brain: Secondary | ICD-10-CM | POA: Diagnosis not present

## 2022-01-18 DIAGNOSIS — C3411 Malignant neoplasm of upper lobe, right bronchus or lung: Secondary | ICD-10-CM | POA: Diagnosis not present

## 2022-01-18 DIAGNOSIS — Z5111 Encounter for antineoplastic chemotherapy: Secondary | ICD-10-CM

## 2022-01-18 DIAGNOSIS — Z7984 Long term (current) use of oral hypoglycemic drugs: Secondary | ICD-10-CM | POA: Diagnosis not present

## 2022-01-18 DIAGNOSIS — Z7982 Long term (current) use of aspirin: Secondary | ICD-10-CM | POA: Diagnosis not present

## 2022-01-18 DIAGNOSIS — Z7952 Long term (current) use of systemic steroids: Secondary | ICD-10-CM | POA: Diagnosis not present

## 2022-01-18 LAB — CBC WITH DIFFERENTIAL/PLATELET
Abs Immature Granulocytes: 0.03 10*3/uL (ref 0.00–0.07)
Basophils Absolute: 0 10*3/uL (ref 0.0–0.1)
Basophils Relative: 0 %
Eosinophils Absolute: 0 10*3/uL (ref 0.0–0.5)
Eosinophils Relative: 0 %
HCT: 33.7 % — ABNORMAL LOW (ref 36.0–46.0)
Hemoglobin: 11.1 g/dL — ABNORMAL LOW (ref 12.0–15.0)
Immature Granulocytes: 0 %
Lymphocytes Relative: 6 %
Lymphs Abs: 0.4 10*3/uL — ABNORMAL LOW (ref 0.7–4.0)
MCH: 33.6 pg (ref 26.0–34.0)
MCHC: 32.9 g/dL (ref 30.0–36.0)
MCV: 102.1 fL — ABNORMAL HIGH (ref 80.0–100.0)
Monocytes Absolute: 0.2 10*3/uL (ref 0.1–1.0)
Monocytes Relative: 3 %
Neutro Abs: 6.3 10*3/uL (ref 1.7–7.7)
Neutrophils Relative %: 91 %
Platelets: 288 10*3/uL (ref 150–400)
RBC: 3.3 MIL/uL — ABNORMAL LOW (ref 3.87–5.11)
RDW: 15.5 % (ref 11.5–15.5)
WBC: 6.9 10*3/uL (ref 4.0–10.5)
nRBC: 0 % (ref 0.0–0.2)

## 2022-01-18 LAB — COMPREHENSIVE METABOLIC PANEL
ALT: 10 U/L (ref 0–44)
AST: 13 U/L — ABNORMAL LOW (ref 15–41)
Albumin: 3.9 g/dL (ref 3.5–5.0)
Alkaline Phosphatase: 59 U/L (ref 38–126)
Anion gap: 6 (ref 5–15)
BUN: 20 mg/dL (ref 8–23)
CO2: 25 mmol/L (ref 22–32)
Calcium: 9.4 mg/dL (ref 8.9–10.3)
Chloride: 107 mmol/L (ref 98–111)
Creatinine, Ser: 0.89 mg/dL (ref 0.44–1.00)
GFR, Estimated: >60 mL/min (ref >60)
Glucose, Bld: 143 mg/dL — ABNORMAL HIGH (ref 70–99)
Potassium: 3.9 mmol/L (ref 3.5–5.1)
Sodium: 138 mmol/L (ref 135–145)
Total Bilirubin: 0.4 mg/dL (ref 0.3–1.2)
Total Protein: 7 g/dL (ref 6.5–8.1)

## 2022-01-18 MED ORDER — SODIUM CHLORIDE 0.9 % IV SOLN: Freq: Once | INTRAVENOUS | Status: AC

## 2022-01-18 MED ORDER — SODIUM CHLORIDE 0.9 % IV SOLN
500.0000 mg/m2 | Freq: Once | INTRAVENOUS | Status: AC
  Administered 2022-01-18: 900 mg via INTRAVENOUS
  Filled 2022-01-18: qty 20

## 2022-01-18 MED ORDER — SODIUM CHLORIDE 0.9 % IV SOLN
10.0000 mg | Freq: Once | INTRAVENOUS | Status: AC
  Administered 2022-01-18: 10 mg via INTRAVENOUS
  Filled 2022-01-18: qty 10

## 2022-01-18 NOTE — Patient Instructions (Signed)
[facility]   Discharge Instructions: Thank you for choosing [facility] to provide your oncology and hematology care.   If you have a lab appointment with the [facility], please go directly to the [facility] and check in at the registration area.   Wear comfortable clothing and clothing appropriate for easy access to any Portacath or PICC line.   We strive to give you quality time with your provider. You may need to reschedule your appointment if you arrive late (15 or more minutes).  Arriving late affects you and other patients whose appointments are after yours.  Also, if you miss three or more appointments without notifying the office, you may be dismissed from the clinic at the provider's discretion.      For prescription refill requests, have your pharmacy contact our office and allow 72 hours for refills to be completed.    Today you received the following chemotherapy and/or immunotherapy agents: pemetrexed      To help prevent nausea and vomiting after your treatment, we encourage you to take your nausea medication as directed.  BELOW ARE SYMPTOMS THAT SHOULD BE REPORTED IMMEDIATELY: *FEVER GREATER THAN 100.4 F (38 C) OR HIGHER *CHILLS OR SWEATING *NAUSEA AND VOMITING THAT IS NOT CONTROLLED WITH YOUR NAUSEA MEDICATION *UNUSUAL SHORTNESS OF BREATH *UNUSUAL BRUISING OR BLEEDING *URINARY PROBLEMS (pain or burning when urinating, or frequent urination) *BOWEL PROBLEMS (unusual diarrhea, constipation, pain near the anus) TENDERNESS IN MOUTH AND THROAT WITH OR WITHOUT PRESENCE OF ULCERS (sore throat, sores in mouth, or a toothache) UNUSUAL RASH, SWELLING OR PAIN  UNUSUAL VAGINAL DISCHARGE OR ITCHING   Items with * indicate a potential emergency and should be followed up as soon as possible or go to the Emergency Department if any problems should occur.  Please show the CHEMOTHERAPY ALERT CARD or IMMUNOTHERAPY ALERT CARD at check-in  to the Emergency Department and triage nurse.  Should you have questions after your visit or need to cancel or reschedule your appointment, please contact [facility]  Dept: [phone]  and follow the prompts.  Office hours are 8:00 a.m. to 4:30 p.m. Monday - Friday. Please note that voicemails left after 4:00 p.m. may not be returned until the following business day.  We are closed weekends and major holidays. You have access to a nurse at all times for urgent questions. Please call the main number to the clinic Dept: [phone] and follow the prompts.   For any non-urgent questions, you may also contact your provider using MyChart. We now offer e-Visits for anyone 11 and older to request care online for non-urgent symptoms. For details visit [email or web address].   Also download the MyChart app! Go to the app store, search "MyChart", open the app, select [facility], and log in with your MyChart username and password.  Due to Covid, a mask is required upon entering the hospital/clinic. If you do not have a mask, one will be given to you upon arrival. For doctor visits, patients may have 1 support person aged 31 or older with them. For treatment visits, patients cannot have anyone with them due to current Covid guidelines and our immunocompromised population.

## 2022-01-28 ENCOUNTER — Other Ambulatory Visit: Payer: Self-pay | Admitting: Nurse Practitioner

## 2022-01-28 DIAGNOSIS — E119 Type 2 diabetes mellitus without complications: Secondary | ICD-10-CM

## 2022-01-31 ENCOUNTER — Other Ambulatory Visit: Payer: Self-pay | Admitting: Physician Assistant

## 2022-01-31 DIAGNOSIS — C7931 Secondary malignant neoplasm of brain: Secondary | ICD-10-CM

## 2022-01-31 DIAGNOSIS — C3411 Malignant neoplasm of upper lobe, right bronchus or lung: Secondary | ICD-10-CM

## 2022-02-08 ENCOUNTER — Inpatient Hospital Stay: Payer: Medicare Other | Attending: Internal Medicine | Admitting: Internal Medicine

## 2022-02-08 ENCOUNTER — Encounter: Payer: Self-pay | Admitting: Internal Medicine

## 2022-02-08 ENCOUNTER — Other Ambulatory Visit: Payer: Self-pay | Admitting: Lab

## 2022-02-08 ENCOUNTER — Inpatient Hospital Stay: Payer: Medicare Other

## 2022-02-08 ENCOUNTER — Other Ambulatory Visit: Payer: Self-pay

## 2022-02-08 VITALS — BP 112/64 | HR 80 | Temp 97.8°F | Resp 18

## 2022-02-08 VITALS — BP 129/52 | HR 75 | Temp 97.0°F | Resp 17 | Wt 158.2 lb

## 2022-02-08 DIAGNOSIS — G939 Disorder of brain, unspecified: Secondary | ICD-10-CM | POA: Insufficient documentation

## 2022-02-08 DIAGNOSIS — Z9049 Acquired absence of other specified parts of digestive tract: Secondary | ICD-10-CM | POA: Insufficient documentation

## 2022-02-08 DIAGNOSIS — Z885 Allergy status to narcotic agent status: Secondary | ICD-10-CM | POA: Insufficient documentation

## 2022-02-08 DIAGNOSIS — Z923 Personal history of irradiation: Secondary | ICD-10-CM | POA: Insufficient documentation

## 2022-02-08 DIAGNOSIS — Z7952 Long term (current) use of systemic steroids: Secondary | ICD-10-CM | POA: Diagnosis not present

## 2022-02-08 DIAGNOSIS — C3411 Malignant neoplasm of upper lobe, right bronchus or lung: Secondary | ICD-10-CM

## 2022-02-08 DIAGNOSIS — C7931 Secondary malignant neoplasm of brain: Secondary | ICD-10-CM | POA: Insufficient documentation

## 2022-02-08 DIAGNOSIS — Z5111 Encounter for antineoplastic chemotherapy: Secondary | ICD-10-CM | POA: Diagnosis not present

## 2022-02-08 DIAGNOSIS — E119 Type 2 diabetes mellitus without complications: Secondary | ICD-10-CM | POA: Diagnosis not present

## 2022-02-08 DIAGNOSIS — Z8541 Personal history of malignant neoplasm of cervix uteri: Secondary | ICD-10-CM | POA: Diagnosis not present

## 2022-02-08 DIAGNOSIS — Z79899 Other long term (current) drug therapy: Secondary | ICD-10-CM | POA: Diagnosis not present

## 2022-02-08 LAB — CBC WITH DIFFERENTIAL/PLATELET
Abs Immature Granulocytes: 0.06 10*3/uL (ref 0.00–0.07)
Basophils Absolute: 0 10*3/uL (ref 0.0–0.1)
Basophils Relative: 0 %
Eosinophils Absolute: 0 10*3/uL (ref 0.0–0.5)
Eosinophils Relative: 0 %
HCT: 34.5 % — ABNORMAL LOW (ref 36.0–46.0)
Hemoglobin: 11.2 g/dL — ABNORMAL LOW (ref 12.0–15.0)
Immature Granulocytes: 1 %
Lymphocytes Relative: 5 %
Lymphs Abs: 0.5 10*3/uL — ABNORMAL LOW (ref 0.7–4.0)
MCH: 33.6 pg (ref 26.0–34.0)
MCHC: 32.5 g/dL (ref 30.0–36.0)
MCV: 103.6 fL — ABNORMAL HIGH (ref 80.0–100.0)
Monocytes Absolute: 0.3 10*3/uL (ref 0.1–1.0)
Monocytes Relative: 3 %
Neutro Abs: 7.9 10*3/uL — ABNORMAL HIGH (ref 1.7–7.7)
Neutrophils Relative %: 91 %
Platelets: 326 10*3/uL (ref 150–400)
RBC: 3.33 MIL/uL — ABNORMAL LOW (ref 3.87–5.11)
RDW: 15.9 % — ABNORMAL HIGH (ref 11.5–15.5)
WBC: 8.7 10*3/uL (ref 4.0–10.5)
nRBC: 0 % (ref 0.0–0.2)

## 2022-02-08 LAB — COMPREHENSIVE METABOLIC PANEL
ALT: 11 U/L (ref 0–44)
AST: 13 U/L — ABNORMAL LOW (ref 15–41)
Albumin: 4 g/dL (ref 3.5–5.0)
Alkaline Phosphatase: 60 U/L (ref 38–126)
Anion gap: 8 (ref 5–15)
BUN: 19 mg/dL (ref 8–23)
CO2: 26 mmol/L (ref 22–32)
Calcium: 9.5 mg/dL (ref 8.9–10.3)
Chloride: 105 mmol/L (ref 98–111)
Creatinine, Ser: 0.92 mg/dL (ref 0.44–1.00)
GFR, Estimated: >60 mL/min (ref >60)
Glucose, Bld: 132 mg/dL — ABNORMAL HIGH (ref 70–99)
Potassium: 3.9 mmol/L (ref 3.5–5.1)
Sodium: 139 mmol/L (ref 135–145)
Total Bilirubin: 0.3 mg/dL (ref 0.3–1.2)
Total Protein: 7.1 g/dL (ref 6.5–8.1)

## 2022-02-08 MED ORDER — SODIUM CHLORIDE 0.9 % IV SOLN: Freq: Once | INTRAVENOUS | Status: AC

## 2022-02-08 MED ORDER — CYANOCOBALAMIN 1000 MCG/ML IJ SOLN
1000.0000 ug | Freq: Once | INTRAMUSCULAR | Status: AC
  Administered 2022-02-08: 1000 ug via INTRAMUSCULAR
  Filled 2022-02-08: qty 1

## 2022-02-08 MED ORDER — SODIUM CHLORIDE 0.9 % IV SOLN
10.0000 mg | Freq: Once | INTRAVENOUS | Status: AC
  Administered 2022-02-08: 10 mg via INTRAVENOUS
  Filled 2022-02-08: qty 10

## 2022-02-08 MED ORDER — SODIUM CHLORIDE 0.9 % IV SOLN
500.0000 mg/m2 | Freq: Once | INTRAVENOUS | Status: AC
  Administered 2022-02-08: 900 mg via INTRAVENOUS
  Filled 2022-02-08: qty 20

## 2022-02-08 NOTE — Patient Instructions (Signed)
[facility]  Discharge Instructions: Thank you for choosing [facility] to provide your oncology and hematology care.   If you have a lab appointment with the [facility], please go directly to the [facility] and check in at the registration area.   Wear comfortable clothing and clothing appropriate for easy access to any Portacath or PICC line.   We strive to give you quality time with your provider. You may need to reschedule your appointment if you arrive late (15 or more minutes).  Arriving late affects you and other patients whose appointments are after yours.  Also, if you miss three or more appointments without notifying the office, you may be dismissed from the clinic at the provider's discretion.      For prescription refill requests, have your pharmacy contact our office and allow 72 hours for refills to be completed.    Today you received the following chemotherapy and/or immunotherapy agents: Pemetrexed.      To help prevent nausea and vomiting after your treatment, we encourage you to take your nausea medication as directed.  BELOW ARE SYMPTOMS THAT SHOULD BE REPORTED IMMEDIATELY: *FEVER GREATER THAN 100.4 F (38 C) OR HIGHER *CHILLS OR SWEATING *NAUSEA AND VOMITING THAT IS NOT CONTROLLED WITH YOUR NAUSEA MEDICATION *UNUSUAL SHORTNESS OF BREATH *UNUSUAL BRUISING OR BLEEDING *URINARY PROBLEMS (pain or burning when urinating, or frequent urination) *BOWEL PROBLEMS (unusual diarrhea, constipation, pain near the anus) TENDERNESS IN MOUTH AND THROAT WITH OR WITHOUT PRESENCE OF ULCERS (sore throat, sores in mouth, or a toothache) UNUSUAL RASH, SWELLING OR PAIN  UNUSUAL VAGINAL DISCHARGE OR ITCHING   Items with * indicate a potential emergency and should be followed up as soon as possible or go to the Emergency Department if any problems should occur.  Please show the CHEMOTHERAPY ALERT CARD or IMMUNOTHERAPY ALERT CARD at check-in  to the Emergency Department and triage nurse.  Should you have questions after your visit or need to cancel or reschedule your appointment, please contact [facility]  Dept: [phone]  and follow the prompts.  Office hours are 8:00 a.m. to 4:30 p.m. Monday - Friday. Please note that voicemails left after 4:00 p.m. may not be returned until the following business day.  We are closed weekends and major holidays. You have access to a nurse at all times for urgent questions. Please call the main number to the clinic Dept: [phone] and follow the prompts.   For any non-urgent questions, you may also contact your provider using MyChart. We now offer e-Visits for anyone 65 and older to request care online for non-urgent symptoms. For details visit [email or web address].   Also download the MyChart app! Go to the app store, search "MyChart", open the app, select [facility], and log in with your MyChart username and password.  Masks are optional in the cancer centers. If you would like for your care team to wear a mask while they are taking care of you, please let them know. For doctor visits, patients may have with them one support person who is at least 70 years old. At this time, visitors are not allowed in the infusion area.

## 2022-02-08 NOTE — Progress Notes (Signed)
[facility] Telephone:([phone]) [phone]   [phone]) [phone]  OFFICE PROGRESS NOTE  [name], NP [place] [address]  DIAGNOSIS: Stage IV (T2a, N0, M1b) non-small cell lung cancer, adenocarcinoma with negative EGFR and ALK mutations diagnosed in January 2015 and presented with right upper lobe lung mass in addition to a solitary brain metastasis.  PRIOR THERAPY: 1) Status post stereotactic radiotherapy to a solitary right parietal brain lesion under the care of Dr. [name] on 10/16/2013. 2) Status post palliative radiotherapy to the right lung tumor under the care of Dr. [name] completed on 12/05/2013. 3) Systemic chemotherapy with carboplatin for AUC of 5 and Alimta 500 mg/M2 every 3 weeks. First dose Jan 06 2014. Status post 6 cycles.  CURRENT THERAPY: Systemic chemotherapy with maintenance Alimta 500 MG/M2 every 3 weeks, status post 132 cycles.  INTERVAL HISTORY: Alexis [name] 71 y.o. female returns to the clinic today for follow-up visit.  The patient is feeling fine today with no concerning complaints.  She denied having any chest pain, shortness of breath, cough or hemoptysis.  She has no nausea, vomiting but has occasional diarrhea with no constipation or abdominal pain.  She has no headache or visual changes.  She has no weight loss or night sweats.  The patient is here today for evaluation before starting cycle #133.  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  MEDICATIONS:  Current Outpatient Medications  Medication Sig Dispense Refill   acetaminophen (TYLENOL) 500 MG  tablet Take 500 mg by mouth every 6 (six) hours as needed for mild pain or headache. Reported on 11/02/2015     ALPRAZolam (XANAX) 1 MG tablet Take 0.5 tablets (0.5 mg total) by mouth 2 (two) times daily as needed for anxiety. 30 tablet 2   Ascorbic Acid (VITAMIN C GUMMIE PO) Take 1 each by mouth every morning.     aspirin EC 81 MG tablet Take 1 tablet (81 mg total) by mouth daily. 150 tablet 2   [employer] ACID CONTROLLER 10 MG tablet SMARTSIG:1 Tablet(s) By Mouth Every 12 Hours PRN     dexamethasone (DECADRON) 4 MG tablet TAKE 1 TABLET TWICE A DAY THE DAY BEFORE, THE DAY OF, AND THE DAY AFTER CHEMOTHERAPY. 40 tablet 2   folic acid (FOLVITE) 1 MG tablet TAKE 1 TABLET BY MOUTH EVERY DAY 90 tablet 0   loratadine (CLARITIN) 10 MG tablet Take 10 mg by mouth daily.     metFORMIN (GLUCOPHAGE) 500 MG tablet TAKE 1 TABLET BY MOUTH TWICE A DAY 60 tablet 1   Multiple Vitamin (MULTIVITAMIN WITH MINERALS) TABS tablet Take 1 tablet by mouth every morning.     omeprazole (PRILOSEC) 20 MG capsule Take 1 capsule (20 mg total) by mouth daily. 30 capsule 2   ondansetron (ZOFRAN) 8 MG tablet TAKE 1 TABLET BY MOUTH BEFORE CHEMO 30 tablet 0   OVER THE COUNTER MEDICATION Take 1 tablet by mouth every morning. (Vitamin A)     prochlorperazine (COMPAZINE) 10 MG tablet Take 1 tablet (10 mg total)  by mouth every 6 (six) hours as needed for nausea or vomiting. 60 tablet 0   rosuvastatin (CRESTOR) 10 MG tablet Take 1 tablet (10 mg total) by mouth daily. 30 tablet 12   senna-docusate (SENOKOT-S) 8.6-50 MG tablet Take 1 tablet by mouth daily. 30 tablet prn   triamcinolone ointment (KENALOG) 0.5 % Apply 1 application topically 2 (two) times daily. 30 g 1   trimethoprim (TRIMPEX) 100 MG tablet Take 100 mg by mouth at bedtime.     valACYclovir (VALTREX) 1000 MG tablet Take 1 tablet (1,000 mg total) by mouth 2 (two) times daily. 14 tablet 0   Vitamin D, Ergocalciferol, (DRISDOL) 1.25 MG (50000 UNIT) CAPS capsule Take 50,000 Units by mouth  once a week.     No current facility-administered medications for this visit.    SURGICAL HISTORY:  Past Surgical History:  Procedure Laterality Date   ABDOMINAL HYSTERECTOMY     COLOSTOMY TAKEDOWN N/A 07/10/2014   Procedure: LAPAROSCOPIC LYSIS OF ADHESIONS (90 MIN) LAPAROSCOPIC ASSISTED COLOSTOMY CLOSURE, RIGID PROCTOSIGMOIDOSCOPY;  Surgeon: Jackolyn Confer, MD;  Location: [facility];  Service: General;  Laterality: N/A;   LAPAROTOMY N/A 11/03/2013   Procedure: EXPLORATORY LAPAROTOMY, DRAINAGE OF INTRA  ABDOMINAL ABSCESSES, MOBILIZATION OF SPLENIC FLEXURE, SIGMOID COLECTOMY WITH COLOSTOMY;  Surgeon: Odis Hollingshead, MD;  Location: [facility];  Service: General;  Laterality: N/A;   VIDEO BRONCHOSCOPY Bilateral 08/30/2013   Procedure: VIDEO BRONCHOSCOPY WITH FLUORO;  Surgeon: Tanda Rockers, MD;  Location: [facility];  Service: Cardiopulmonary;  Laterality: Bilateral;    REVIEW OF SYSTEMS:  A comprehensive review of systems was negative except for: Constitutional: positive for fatigue   PHYSICAL EXAMINATION: General appearance: alert, cooperative, and no distress Head: Normocephalic, without obvious abnormality, atraumatic Neck: no adenopathy, no JVD, supple, symmetrical, trachea midline, and thyroid not enlarged, symmetric, no tenderness/mass/nodules Lymph nodes: Cervical, supraclavicular, and axillary nodes normal. Resp: clear to auscultation bilaterally Back: symmetric, no curvature. ROM normal. No CVA tenderness. Cardio: regular rate and rhythm, S1, S2 normal, no murmur, click, rub or gallop GI: soft, non-tender; bowel sounds normal; no masses,  no organomegaly Extremities: extremities normal, atraumatic, no cyanosis or edema   ECOG PERFORMANCE STATUS: 1 - Symptomatic but completely ambulatory   Blood pressure (!) 129/52, pulse 75, temperature (!) 97 F (36.1 C), temperature source Tympanic, resp. rate 17, weight 158 lb 3 oz (71.8 kg), SpO2 99 %.  LABORATORY DATA: Lab Results   Component Value Date   WBC 8.7 02/08/2022   HGB 11.2 (L) 02/08/2022   HCT 34.5 (L) 02/08/2022   MCV 103.6 (H) 02/08/2022   PLT 326 02/08/2022      Chemistry      Component Value Date/Time   NA 138 01/18/2022 1110   NA 137 08/31/2021 0900   NA 139 08/14/2017 0837   K 3.9 01/18/2022 1110   K 4.4 08/14/2017 0837   CL 107 01/18/2022 1110   CO2 25 01/18/2022 1110   CO2 24 08/14/2017 0837   BUN 20 01/18/2022 1110   BUN 24 08/31/2021 0900   BUN 13.3 08/14/2017 0837   CREATININE 0.89 01/18/2022 1110   CREATININE 0.80 11/16/2021 0805   CREATININE 0.8 08/14/2017 0837      Component Value Date/Time   CALCIUM 9.4 01/18/2022 1110   CALCIUM 9.3 08/14/2017 0837   ALKPHOS 59 01/18/2022 1110   ALKPHOS 107 08/14/2017 0837   AST 13 (L) 01/18/2022 1110   AST 11 (L) 11/16/2021 0805   AST 12 08/14/2017 0837  ALT 10 01/18/2022 1110   ALT 12 11/16/2021 0805   ALT 12 08/14/2017 0837   BILITOT 0.4 01/18/2022 1110   BILITOT 0.5 11/16/2021 0805   BILITOT 0.37 08/14/2017 0837       RADIOGRAPHIC STUDIES: No results found.  ASSESSMENT AND PLAN:  This is a very pleasant 71 years old white female with metastatic non-small cell lung cancer, adenocarcinoma status post induction systemic chemotherapy with carboplatin and Alimta with partial response. The patient is currently on maintenance treatment with single agent Alimta status post 132 cycles. The patient continues to tolerate her treatment well with no concerning adverse effects. I recommended for her to proceed with cycle #133 today as planned. The patient will come back for follow-up visit in 3 weeks for evaluation before the next cycle of her treatment. For the anxiety she is currently on Xanax. She was advised to call immediately if she has any other concerning symptoms in the interval. The patient voices understanding of current disease status and treatment options and is in agreement with the current care plan. All questions were  answered. The patient knows to call the clinic with any problems, questions or concerns. We can certainly see the patient much sooner if necessary.  Disclaimer: This note was dictated with voice recognition software. Similar sounding words can inadvertently be transcribed and may not be corrected upon review.

## 2022-02-18 ENCOUNTER — Other Ambulatory Visit: Payer: Self-pay | Admitting: Physician Assistant

## 2022-02-18 DIAGNOSIS — C3411 Malignant neoplasm of upper lobe, right bronchus or lung: Secondary | ICD-10-CM

## 2022-02-19 ENCOUNTER — Other Ambulatory Visit: Payer: Self-pay | Admitting: Physician Assistant

## 2022-02-19 DIAGNOSIS — C3490 Malignant neoplasm of unspecified part of unspecified bronchus or lung: Secondary | ICD-10-CM

## 2022-02-24 NOTE — Progress Notes (Signed)
[facility] [name] OFFICE PROGRESS NOTE  [name], NP [place] [address]  DIAGNOSIS: Stage IV (T2a, N0, M1b) non-small cell lung cancer, adenocarcinoma with negative EGFR and ALK mutations diagnosed in January 2015 and presented with right upper lobe lung mass in addition to a solitary brain metastasis  PRIOR THERAPY: 1) Status post stereotactic radiotherapy to a solitary right parietal brain lesion under the care of Dr. [name] on 10/16/2013. 2) Status post palliative radiotherapy to the right lung tumor under the care of Dr. [name] completed on 12/05/2013. 3) Systemic chemotherapy with carboplatin for AUC of 5 and Alimta 500 mg/M2 every 3 weeks. First dose Jan 06 2014. Status post 6 cycles.  CURRENT THERAPY: : Systemic chemotherapy with maintenance Alimta 500 MG/M2 every 3 weeks, status post 133 cycles.  INTERVAL HISTORY: Alexis [name] 71 y.o. female returns to the clinic today for a follow-up visit.  The patient is feeling fairly well today without any concerning complaints. She is wondering if she can go on a cruise in September. She is currently undergoing single agent chemotherapy with Alimta.  She denies any fever, chills, night sweats, or unexplained weight loss. She is trying to be more active by doing pool exercises.  She denies any chest pain, cough, or hemoptysis.  She denies any significant or worsening shortness of breath with exertion except for mild shortness of breath with certain activities.  Denies any nausea, vomiting, or constipation. She does report she sometimes has loose stool after eating. Her symptoms improve with imodium. Denies abdominal pain.  Denies any headache or visual changes.  She sometimes has recurrent UTIs for which she is followed closely by Dr. Jeffie Pollock from urology. She is here today for evaluation and repeat blood work before starting cycle 134.  MEDICAL HISTORY: Past Medical History:  Diagnosis Date   Anxiety     Anxiety 06/20/2016   Cervical cancer ([facility])    Diabetes mellitus without complication ([facility] [place])    patient states she has type 2   Encounter for antineoplastic chemotherapy 07/20/2015   Malignant neoplasm of right upper lobe of lung ([facility])     non small cell lung cancer adenocarcioma with brain meta    ALLERGIES:  is allergic to codeine.  MEDICATIONS:  Current Outpatient Medications  Medication Sig Dispense Refill   acetaminophen (TYLENOL) 500 MG tablet Take 500 mg by mouth every 6 (six) hours as needed for mild pain or headache. Reported on 11/02/2015     ALPRAZolam (XANAX) 1 MG tablet Take 0.5 tablets (0.5 mg total) by mouth 2 (two) times daily as needed for anxiety. 30 tablet 2   Ascorbic Acid (VITAMIN C GUMMIE PO) Take 1 each by mouth every morning.     aspirin EC 81 MG tablet Take 1 tablet (81 mg total) by mouth daily. 150 tablet 2   [employer] ACID CONTROLLER 10 MG tablet SMARTSIG:1 Tablet(s) By Mouth Every 12 Hours PRN     dexamethasone (DECADRON) 4 MG tablet TAKE 1 TABLET TWICE A DAY THE DAY BEFORE, THE DAY OF, AND THE DAY AFTER CHEMOTHERAPY. 40 tablet 2   folic acid (FOLVITE) 1 MG tablet TAKE 1 TABLET BY MOUTH EVERY DAY 90 tablet 0   loratadine (CLARITIN) 10 MG tablet Take 10 mg by mouth daily.     metFORMIN (GLUCOPHAGE) 500 MG tablet TAKE 1 TABLET BY MOUTH TWICE A DAY 60 tablet 1   Multiple Vitamin (MULTIVITAMIN WITH MINERALS) TABS tablet Take 1 tablet by  mouth every morning.     omeprazole (PRILOSEC) 20 MG capsule TAKE 1 CAPSULE BY MOUTH EVERY DAY 90 capsule 0   ondansetron (ZOFRAN) 8 MG tablet TAKE 1 TABLET BY MOUTH BEFORE CHEMO 30 tablet 0   OVER THE COUNTER MEDICATION Take 1 tablet by mouth every morning. (Vitamin A)     prochlorperazine (COMPAZINE) 10 MG tablet Take 1 tablet (10 mg total) by mouth every 6 (six) hours as needed for nausea or vomiting. 60 tablet 0   rosuvastatin (CRESTOR) 10 MG tablet Take 1 tablet (10 mg total) by mouth daily. 30 tablet 12   senna-docusate (SENOKOT-S)  8.6-50 MG tablet Take 1 tablet by mouth daily. 30 tablet prn   trimethoprim (TRIMPEX) 100 MG tablet Take 100 mg by mouth at bedtime.     Vitamin D, Ergocalciferol, (DRISDOL) 1.25 MG (50000 UNIT) CAPS capsule Take 50,000 Units by mouth once a week.     No current facility-administered medications for this visit.   Facility-Administered Medications Ordered in Other Visits  Medication Dose Route Frequency Provider Last Rate Last Admin   dexamethasone (DECADRON) 10 mg in sodium chloride 0.9 % 50 mL IVPB  10 mg Intravenous Once Curt Bears, MD 204 mL/hr at 03/02/22 1155 10 mg at 03/02/22 1155   PEMEtrexed (ALIMTA) 900 mg in sodium chloride 0.9 % 100 mL chemo infusion  500 mg/m2 (Treatment Plan Recorded) Intravenous Once Curt Bears, MD        SURGICAL HISTORY:  Past Surgical History:  Procedure Laterality Date   ABDOMINAL HYSTERECTOMY     COLOSTOMY TAKEDOWN N/A 07/10/2014   Procedure: LAPAROSCOPIC LYSIS OF ADHESIONS (90 MIN) LAPAROSCOPIC ASSISTED COLOSTOMY CLOSURE, RIGID PROCTOSIGMOIDOSCOPY;  Surgeon: Jackolyn Confer, MD;  Location: [facility];  Service: General;  Laterality: N/A;   LAPAROTOMY N/A 11/03/2013   Procedure: EXPLORATORY LAPAROTOMY, DRAINAGE OF INTRA  ABDOMINAL ABSCESSES, MOBILIZATION OF SPLENIC FLEXURE, SIGMOID COLECTOMY WITH COLOSTOMY;  Surgeon: Odis Hollingshead, MD;  Location: [facility];  Service: General;  Laterality: N/A;   VIDEO BRONCHOSCOPY Bilateral 08/30/2013   Procedure: VIDEO BRONCHOSCOPY WITH FLUORO;  Surgeon: Tanda Rockers, MD;  Location: [facility];  Service: Cardiopulmonary;  Laterality: Bilateral;    REVIEW OF SYSTEMS:   Constitutional: Negative for appetite change, chills, fatigue, fever and unexpected weight change.  HENT: Negative for mouth sores, nosebleeds, sore throat and trouble swallowing.   Eyes: Negative for eye problems and icterus.  Respiratory: Negative for cough, hemoptysis, shortness of breath and wheezing.   Cardiovascular: Negative for chest  pain and leg swelling.  Gastrointestinal: Positive for baseline intermittent diarrhea. Negative for abdominal pain, constipation, nausea and vomiting.  Genitourinary: Negative for bladder incontinence, difficulty urinating, dysuria, frequency and hematuria.   Musculoskeletal: Negative for back pain, gait problem, neck pain and neck stiffness.  Skin: Negative for itching and rash.  Neurological: Negative for dizziness, extremity weakness, gait problem, headaches, light-headedness and seizures.  Hematological: Negative for adenopathy. Does not bruise/bleed easily.  Psychiatric/Behavioral: Negative for confusion, depression and sleep disturbance. The patient is not nervous/anxious.   PHYSICAL EXAMINATION:  Blood pressure 123/75, pulse 98, temperature 98.1 F (36.7 C), temperature source Oral, resp. rate 17, weight 155 lb 7 oz (70.5 kg), SpO2 97 %.  ECOG PERFORMANCE STATUS: 1  Physical Exam  Constitutional: Oriented to person, place, and time and well-developed, well-nourished, and in no distress. No distress.  HENT:  Head: Normocephalic and atraumatic.  Mouth/Throat: Oropharynx is clear and moist. No oropharyngeal exudate.  Eyes: Conjunctivae are normal. Right eye exhibits no  discharge. Left eye exhibits no discharge. No scleral icterus.  Neck: Normal range of motion. Neck supple.  Cardiovascular: Normal rate, regular rhythm, normal heart sounds and intact distal pulses.   Pulmonary/Chest: Effort normal and breath sounds normal. No respiratory distress. No wheezes. No rales.  Abdominal: Soft. Bowel sounds are normal. Exhibits no distension and no mass. There is no tenderness.  Musculoskeletal: Normal range of motion. Exhibits no edema.  Lymphadenopathy:    No cervical adenopathy.  Neurological: Alert and oriented to person, place, and time. Exhibits normal muscle tone. Gait normal. Coordination normal.  Skin: Skin is warm and dry. No rash noted. Not diaphoretic. No erythema. No pallor.   Psychiatric: Mood, memory and judgment normal.  Vitals reviewed.  LABORATORY DATA: Lab Results  Component Value Date   WBC 7.8 03/02/2022   HGB 11.8 (L) 03/02/2022   HCT 35.2 (L) 03/02/2022   MCV 102.0 (H) 03/02/2022   PLT 399 03/02/2022      Chemistry      Component Value Date/Time   NA 138 03/02/2022 1048   NA 137 08/31/2021 0900   NA 139 08/14/2017 0837   K 4.3 03/02/2022 1048   K 4.4 08/14/2017 0837   CL 105 03/02/2022 1048   CO2 26 03/02/2022 1048   CO2 24 08/14/2017 0837   BUN 15 03/02/2022 1048   BUN 24 08/31/2021 0900   BUN 13.3 08/14/2017 0837   CREATININE 1.00 03/02/2022 1048   CREATININE 0.80 11/16/2021 0805   CREATININE 0.8 08/14/2017 0837      Component Value Date/Time   CALCIUM 9.8 03/02/2022 1048   CALCIUM 9.3 08/14/2017 0837   ALKPHOS 69 03/02/2022 1048   ALKPHOS 107 08/14/2017 0837   AST 13 (L) 03/02/2022 1048   AST 11 (L) 11/16/2021 0805   AST 12 08/14/2017 0837   ALT 11 03/02/2022 1048   ALT 12 11/16/2021 0805   ALT 12 08/14/2017 0837   BILITOT 0.5 03/02/2022 1048   BILITOT 0.5 11/16/2021 0805   BILITOT 0.37 08/14/2017 0837       RADIOGRAPHIC STUDIES:  No results found.   ASSESSMENT/PLAN:  This is a very pleasant 71 year old Caucasian female with stage IV non-small cell lung cancer, adenocarcinoma who presented with a right upper lobe lung mass in addition to a solitary brain metastatsis.  She was diagnosed in January 2015.   The patient had completed induction systemic chemotherapy with carboplatin and Alimta with a partial response.  The patient is currently being treated with single  agent maintenance Alimta.  She is status post 133 cycles.    Labs were reviewed. Recommend that she  proceed with #134 today scheduled.   I will arrange for restaging CT scan of the chest, abdomen, pelvis prior to starting her next cycle of treatment.   We will see her back for follow-up visit in 3 weeks for evaluation and to review her scan results  before starting cycle #135   Advised that the patient may want to take imodium in the mornings to stay ahead of her diarrhea. She may also continue to take it after loose stools as she has been taking it.   Told the patient to let us know if her possible upcoming cruise interferes with her infusion appointments. We would be happy to delay her infusion by a week or so if needed for any upcoming traveling.   The patient was advised to call immediately if she has any concerning symptoms in the interval. The patient voices understanding of  current disease status and treatment options and is in agreement with the current care plan. All questions were answered. The patient knows to call the clinic with any problems, questions or concerns. We can certainly see the patient much sooner if necessary     Orders Placed This Encounter  Procedures   CT Chest W Contrast    Standing Status:   Future    Standing Expiration Date:   03/02/2023    Order Specific Question:   If indicated for the ordered procedure, I authorize the administration of contrast media per Radiology protocol    Answer:   Yes    Order Specific Question:   Preferred imaging location?    Answer:   [facility]   CT Abdomen Pelvis W Contrast    Standing Status:   Future    Standing Expiration Date:   03/02/2023    Order Specific Question:   If indicated for the ordered procedure, I authorize the administration of contrast media per Radiology protocol    Answer:   Yes    Order Specific Question:   Preferred imaging location?    Answer:   [facility]    Order Specific Question:   Is Oral Contrast requested for this exam?    Answer:   Yes, Per Radiology protocol     The total time spent in the appointment was 20-29 minute.  Alexis Cerveny L Dynastee Brummell, PA-C 03/02/22

## 2022-02-25 ENCOUNTER — Other Ambulatory Visit: Payer: Self-pay | Admitting: Nurse Practitioner

## 2022-02-25 DIAGNOSIS — E119 Type 2 diabetes mellitus without complications: Secondary | ICD-10-CM

## 2022-02-28 MED FILL — Dexamethasone Sodium Phosphate Inj 100 MG/10ML: INTRAMUSCULAR | Qty: 1 | Status: AC

## 2022-03-02 ENCOUNTER — Inpatient Hospital Stay: Payer: Medicare Other | Attending: Internal Medicine

## 2022-03-02 ENCOUNTER — Inpatient Hospital Stay (HOSPITAL_BASED_OUTPATIENT_CLINIC_OR_DEPARTMENT_OTHER): Payer: Medicare Other | Admitting: Physician Assistant

## 2022-03-02 ENCOUNTER — Inpatient Hospital Stay: Payer: Medicare Other

## 2022-03-02 ENCOUNTER — Other Ambulatory Visit: Payer: Self-pay

## 2022-03-02 VITALS — BP 124/46 | HR 83 | Resp 17

## 2022-03-02 VITALS — BP 123/75 | HR 98 | Temp 98.1°F | Resp 17 | Wt 155.4 lb

## 2022-03-02 DIAGNOSIS — C3411 Malignant neoplasm of upper lobe, right bronchus or lung: Secondary | ICD-10-CM | POA: Insufficient documentation

## 2022-03-02 DIAGNOSIS — Z9221 Personal history of antineoplastic chemotherapy: Secondary | ICD-10-CM | POA: Insufficient documentation

## 2022-03-02 DIAGNOSIS — F419 Anxiety disorder, unspecified: Secondary | ICD-10-CM | POA: Insufficient documentation

## 2022-03-02 DIAGNOSIS — Z923 Personal history of irradiation: Secondary | ICD-10-CM | POA: Insufficient documentation

## 2022-03-02 DIAGNOSIS — C7931 Secondary malignant neoplasm of brain: Secondary | ICD-10-CM | POA: Diagnosis not present

## 2022-03-02 DIAGNOSIS — Z5111 Encounter for antineoplastic chemotherapy: Secondary | ICD-10-CM | POA: Diagnosis not present

## 2022-03-02 DIAGNOSIS — Z79899 Other long term (current) drug therapy: Secondary | ICD-10-CM | POA: Diagnosis not present

## 2022-03-02 DIAGNOSIS — Z5112 Encounter for antineoplastic immunotherapy: Secondary | ICD-10-CM | POA: Insufficient documentation

## 2022-03-02 LAB — CBC WITH DIFFERENTIAL/PLATELET
Abs Immature Granulocytes: 0.04 10*3/uL (ref 0.00–0.07)
Basophils Absolute: 0 10*3/uL (ref 0.0–0.1)
Basophils Relative: 0 %
Eosinophils Absolute: 0 10*3/uL (ref 0.0–0.5)
Eosinophils Relative: 0 %
HCT: 35.2 % — ABNORMAL LOW (ref 36.0–46.0)
Hemoglobin: 11.8 g/dL — ABNORMAL LOW (ref 12.0–15.0)
Immature Granulocytes: 1 %
Lymphocytes Relative: 5 %
Lymphs Abs: 0.4 10*3/uL — ABNORMAL LOW (ref 0.7–4.0)
MCH: 34.2 pg — ABNORMAL HIGH (ref 26.0–34.0)
MCHC: 33.5 g/dL (ref 30.0–36.0)
MCV: 102 fL — ABNORMAL HIGH (ref 80.0–100.0)
Monocytes Absolute: 0.3 10*3/uL (ref 0.1–1.0)
Monocytes Relative: 4 %
Neutro Abs: 7 10*3/uL (ref 1.7–7.7)
Neutrophils Relative %: 90 %
Platelets: 399 10*3/uL (ref 150–400)
RBC: 3.45 MIL/uL — ABNORMAL LOW (ref 3.87–5.11)
RDW: 15.5 % (ref 11.5–15.5)
WBC: 7.8 10*3/uL (ref 4.0–10.5)
nRBC: 0 % (ref 0.0–0.2)

## 2022-03-02 LAB — COMPREHENSIVE METABOLIC PANEL
ALT: 11 U/L (ref 0–44)
AST: 13 U/L — ABNORMAL LOW (ref 15–41)
Albumin: 4.1 g/dL (ref 3.5–5.0)
Alkaline Phosphatase: 69 U/L (ref 38–126)
Anion gap: 7 (ref 5–15)
BUN: 15 mg/dL (ref 8–23)
CO2: 26 mmol/L (ref 22–32)
Calcium: 9.8 mg/dL (ref 8.9–10.3)
Chloride: 105 mmol/L (ref 98–111)
Creatinine, Ser: 1 mg/dL (ref 0.44–1.00)
GFR, Estimated: >60 mL/min (ref >60)
Glucose, Bld: 148 mg/dL — ABNORMAL HIGH (ref 70–99)
Potassium: 4.3 mmol/L (ref 3.5–5.1)
Sodium: 138 mmol/L (ref 135–145)
Total Bilirubin: 0.5 mg/dL (ref 0.3–1.2)
Total Protein: 7.2 g/dL (ref 6.5–8.1)

## 2022-03-02 MED ORDER — SODIUM CHLORIDE 0.9 % IV SOLN: Freq: Once | INTRAVENOUS | Status: AC

## 2022-03-02 MED ORDER — SODIUM CHLORIDE 0.9 % IV SOLN
500.0000 mg/m2 | Freq: Once | INTRAVENOUS | Status: AC
  Administered 2022-03-02: 900 mg via INTRAVENOUS
  Filled 2022-03-02: qty 20

## 2022-03-02 MED ORDER — SODIUM CHLORIDE 0.9 % IV SOLN
10.0000 mg | Freq: Once | INTRAVENOUS | Status: AC
  Administered 2022-03-02: 10 mg via INTRAVENOUS
  Filled 2022-03-02: qty 10

## 2022-03-02 NOTE — Patient Instructions (Signed)
[facility]   Discharge Instructions: Thank you for choosing [facility] to provide your oncology and hematology care.   If you have a lab appointment with the [facility], please go directly to the [facility] and check in at the registration area.   Wear comfortable clothing and clothing appropriate for easy access to any Portacath or PICC line.   We strive to give you quality time with your provider. You may need to reschedule your appointment if you arrive late (15 or more minutes).  Arriving late affects you and other patients whose appointments are after yours.  Also, if you miss three or more appointments without notifying the office, you may be dismissed from the clinic at the provider's discretion.      For prescription refill requests, have your pharmacy contact our office and allow 72 hours for refills to be completed.    Today you received the following chemotherapy and/or immunotherapy agents: Pemetrexed (Alimta)       To help prevent nausea and vomiting after your treatment, we encourage you to take your nausea medication as directed.  BELOW ARE SYMPTOMS THAT SHOULD BE REPORTED IMMEDIATELY: *FEVER GREATER THAN 100.4 F (38 C) OR HIGHER *CHILLS OR SWEATING *NAUSEA AND VOMITING THAT IS NOT CONTROLLED WITH YOUR NAUSEA MEDICATION *UNUSUAL SHORTNESS OF BREATH *UNUSUAL BRUISING OR BLEEDING *URINARY PROBLEMS (pain or burning when urinating, or frequent urination) *BOWEL PROBLEMS (unusual diarrhea, constipation, pain near the anus) TENDERNESS IN MOUTH AND THROAT WITH OR WITHOUT PRESENCE OF ULCERS (sore throat, sores in mouth, or a toothache) UNUSUAL RASH, SWELLING OR PAIN  UNUSUAL VAGINAL DISCHARGE OR ITCHING   Items with * indicate a potential emergency and should be followed up as soon as possible or go to the Emergency Department if any problems should occur.  Please show the CHEMOTHERAPY ALERT CARD or IMMUNOTHERAPY ALERT CARD at  check-in to the Emergency Department and triage nurse.  Should you have questions after your visit or need to cancel or reschedule your appointment, please contact [facility]  Dept: [phone]  and follow the prompts.  Office hours are 8:00 a.m. to 4:30 p.m. Monday - Friday. Please note that voicemails left after 4:00 p.m. may not be returned until the following business day.  We are closed weekends and major holidays. You have access to a nurse at all times for urgent questions. Please call the main number to the clinic Dept: [phone] and follow the prompts.   For any non-urgent questions, you may also contact your provider using MyChart. We now offer e-Visits for anyone 71 and older to request care online for non-urgent symptoms. For details visit [email or web address].   Also download the MyChart app! Go to the app store, search "MyChart", open the app, select [facility], and log in with your MyChart username and password.  Masks are optional in the cancer centers. If you would like for your care team to wear a mask while they are taking care of you, please let them know. For doctor visits, patients may have with them one support person who is at least 70 years old. At this time, visitors are not allowed in the infusion area.

## 2022-03-10 ENCOUNTER — Ambulatory Visit
Admission: RE | Admit: 2022-03-10 | Discharge: 2022-03-10 | Disposition: A | Discharge status: Home / Self Care | Payer: Medicare Other | Source: Ambulatory Visit | Attending: Physician Assistant | Admitting: Physician Assistant

## 2022-03-10 DIAGNOSIS — J8410 Pulmonary fibrosis, unspecified: Secondary | ICD-10-CM | POA: Diagnosis not present

## 2022-03-10 DIAGNOSIS — J432 Centrilobular emphysema: Secondary | ICD-10-CM | POA: Diagnosis not present

## 2022-03-10 DIAGNOSIS — C3411 Malignant neoplasm of upper lobe, right bronchus or lung: Secondary | ICD-10-CM

## 2022-03-10 DIAGNOSIS — K429 Umbilical hernia without obstruction or gangrene: Secondary | ICD-10-CM | POA: Diagnosis not present

## 2022-03-10 DIAGNOSIS — I700 Atherosclerosis of aorta: Secondary | ICD-10-CM | POA: Diagnosis not present

## 2022-03-10 DIAGNOSIS — Z5111 Encounter for antineoplastic chemotherapy: Secondary | ICD-10-CM | POA: Diagnosis not present

## 2022-03-10 DIAGNOSIS — C3490 Malignant neoplasm of unspecified part of unspecified bronchus or lung: Secondary | ICD-10-CM | POA: Diagnosis not present

## 2022-03-10 MED ORDER — IOPAMIDOL (ISOVUE-300) INJECTION 61%
100.0000 mL | Freq: Once | INTRAVENOUS | Status: AC | PRN
  Administered 2022-03-10: 100 mL via INTRAVENOUS

## 2022-03-21 ENCOUNTER — Other Ambulatory Visit: Payer: Self-pay

## 2022-03-21 MED FILL — Dexamethasone Sodium Phosphate Inj 100 MG/10ML: INTRAMUSCULAR | Qty: 1 | Status: AC

## 2022-03-22 ENCOUNTER — Encounter: Payer: Self-pay | Admitting: Internal Medicine

## 2022-03-22 ENCOUNTER — Inpatient Hospital Stay: Payer: Medicare Other

## 2022-03-22 ENCOUNTER — Inpatient Hospital Stay (HOSPITAL_BASED_OUTPATIENT_CLINIC_OR_DEPARTMENT_OTHER): Payer: Medicare Other | Admitting: Internal Medicine

## 2022-03-22 ENCOUNTER — Other Ambulatory Visit: Payer: Self-pay

## 2022-03-22 VITALS — BP 139/63 | HR 85 | Temp 97.7°F | Resp 16 | Wt 157.2 lb

## 2022-03-22 DIAGNOSIS — Z5112 Encounter for antineoplastic immunotherapy: Secondary | ICD-10-CM | POA: Diagnosis not present

## 2022-03-22 DIAGNOSIS — D6481 Anemia due to antineoplastic chemotherapy: Secondary | ICD-10-CM | POA: Diagnosis not present

## 2022-03-22 DIAGNOSIS — C3411 Malignant neoplasm of upper lobe, right bronchus or lung: Secondary | ICD-10-CM

## 2022-03-22 DIAGNOSIS — T451X5A Adverse effect of antineoplastic and immunosuppressive drugs, initial encounter: Secondary | ICD-10-CM | POA: Diagnosis not present

## 2022-03-22 DIAGNOSIS — C7931 Secondary malignant neoplasm of brain: Secondary | ICD-10-CM | POA: Diagnosis not present

## 2022-03-22 DIAGNOSIS — Z9221 Personal history of antineoplastic chemotherapy: Secondary | ICD-10-CM | POA: Diagnosis not present

## 2022-03-22 DIAGNOSIS — Z923 Personal history of irradiation: Secondary | ICD-10-CM | POA: Diagnosis not present

## 2022-03-22 DIAGNOSIS — F419 Anxiety disorder, unspecified: Secondary | ICD-10-CM | POA: Diagnosis not present

## 2022-03-22 DIAGNOSIS — Z5111 Encounter for antineoplastic chemotherapy: Secondary | ICD-10-CM

## 2022-03-22 LAB — CBC WITH DIFFERENTIAL/PLATELET
Abs Immature Granulocytes: 0.04 K/uL (ref 0.00–0.07)
Basophils Absolute: 0 K/uL (ref 0.0–0.1)
Basophils Relative: 0 %
Eosinophils Absolute: 0 K/uL (ref 0.0–0.5)
Eosinophils Relative: 0 %
HCT: 32.1 % — ABNORMAL LOW (ref 36.0–46.0)
Hemoglobin: 10.7 g/dL — ABNORMAL LOW (ref 12.0–15.0)
Immature Granulocytes: 1 %
Lymphocytes Relative: 5 %
Lymphs Abs: 0.3 K/uL — ABNORMAL LOW (ref 0.7–4.0)
MCH: 34.1 pg — ABNORMAL HIGH (ref 26.0–34.0)
MCHC: 33.3 g/dL (ref 30.0–36.0)
MCV: 102.2 fL — ABNORMAL HIGH (ref 80.0–100.0)
Monocytes Absolute: 0.3 K/uL (ref 0.1–1.0)
Monocytes Relative: 5 %
Neutro Abs: 5.9 K/uL (ref 1.7–7.7)
Neutrophils Relative %: 89 %
Platelets: 272 K/uL (ref 150–400)
RBC: 3.14 MIL/uL — ABNORMAL LOW (ref 3.87–5.11)
RDW: 15 % (ref 11.5–15.5)
WBC: 6.6 K/uL (ref 4.0–10.5)
nRBC: 0 % (ref 0.0–0.2)

## 2022-03-22 LAB — COMPREHENSIVE METABOLIC PANEL WITH GFR
ALT: 10 U/L (ref 0–44)
AST: 12 U/L — ABNORMAL LOW (ref 15–41)
Albumin: 4 g/dL (ref 3.5–5.0)
Alkaline Phosphatase: 64 U/L (ref 38–126)
Anion gap: 8 (ref 5–15)
BUN: 24 mg/dL — ABNORMAL HIGH (ref 8–23)
CO2: 25 mmol/L (ref 22–32)
Calcium: 9.4 mg/dL (ref 8.9–10.3)
Chloride: 104 mmol/L (ref 98–111)
Creatinine, Ser: 0.92 mg/dL (ref 0.44–1.00)
GFR, Estimated: >60 mL/min
Glucose, Bld: 137 mg/dL — ABNORMAL HIGH (ref 70–99)
Potassium: 4.5 mmol/L (ref 3.5–5.1)
Sodium: 137 mmol/L (ref 135–145)
Total Bilirubin: 0.4 mg/dL (ref 0.3–1.2)
Total Protein: 6.7 g/dL (ref 6.5–8.1)

## 2022-03-22 MED ORDER — SODIUM CHLORIDE 0.9 % IV SOLN
500.0000 mg/m2 | Freq: Once | INTRAVENOUS | Status: AC
  Administered 2022-03-22: 900 mg via INTRAVENOUS
  Filled 2022-03-22: qty 20

## 2022-03-22 MED ORDER — SODIUM CHLORIDE 0.9 % IV SOLN: Freq: Once | INTRAVENOUS | Status: AC

## 2022-03-22 MED ORDER — SODIUM CHLORIDE 0.9 % IV SOLN
10.0000 mg | Freq: Once | INTRAVENOUS | Status: AC
  Administered 2022-03-22: 10 mg via INTRAVENOUS
  Filled 2022-03-22: qty 10

## 2022-03-22 NOTE — Progress Notes (Signed)
[facility] [phone]   [phone]  OFFICE PROGRESS NOTE  [name], NP [place] [address]  DIAGNOSIS: Stage IV (T2a, N0, M1b) non-small cell lung cancer, adenocarcinoma with negative EGFR and ALK mutations diagnosed in January 2015 and presented with right upper lobe lung mass in addition to a solitary brain metastasis.  PRIOR THERAPY: 1) Status post stereotactic radiotherapy to a solitary right parietal brain lesion under the care of Dr. [name] on 10/16/2013. 2) Status post palliative radiotherapy to the right lung tumor under the care of Dr. [name] completed on 12/05/2013. 3) Systemic chemotherapy with carboplatin for AUC of 5 and Alimta 500 mg/M2 every 3 weeks. First dose Jan 06 2014. Status post 6 cycles.  CURRENT THERAPY: Systemic chemotherapy with maintenance Alimta 500 MG/M2 every 3 weeks, status post 134 cycles.  INTERVAL HISTORY: Alexis [name] 71 y.o. female returns to the clinic today for follow-up visit accompanied by her husband.  The patient is feeling fine today with no concerning complaints except for mild fatigue.  She denied having any current chest pain, shortness of breath, cough or hemoptysis.  She has no nausea, vomiting, diarrhea or constipation.  She has no headache or visual changes.  She denied having any significant weight loss or night sweats.  She continues to tolerate her treatment with maintenance Alimta fairly well.  The patient is here today for evaluation with repeat CT scan of the chest, abdomen and pelvis for restaging of her disease.   MEDICAL HISTORY: Past Medical History:  Diagnosis Date   Anxiety    Anxiety 06/20/2016   Cervical cancer ([facility])    Diabetes mellitus without complication ([facility])    patient states she has type 2   Encounter for antineoplastic chemotherapy 07/20/2015   Malignant neoplasm of right upper lobe of lung ([facility])     non small cell lung cancer  adenocarcioma with brain meta    ALLERGIES:  is allergic to codeine.  MEDICATIONS:  Current Outpatient Medications  Medication Sig Dispense Refill   acetaminophen (TYLENOL) 500 MG tablet Take 500 mg by mouth every 6 (six) hours as needed for mild pain or headache. Reported on 11/02/2015     ALPRAZolam (XANAX) 1 MG tablet Take 0.5 tablets (0.5 mg total) by mouth 2 (two) times daily as needed for anxiety. 30 tablet 2   Ascorbic Acid (VITAMIN C GUMMIE PO) Take 1 each by mouth every morning.     aspirin EC 81 MG tablet Take 1 tablet (81 mg total) by mouth daily. 150 tablet 2   [employer] ACID CONTROLLER 10 MG tablet SMARTSIG:1 Tablet(s) By Mouth Every 12 Hours PRN     dexamethasone (DECADRON) 4 MG tablet TAKE 1 TABLET TWICE A DAY THE DAY BEFORE, THE DAY OF, AND THE DAY AFTER CHEMOTHERAPY. 40 tablet 2   folic acid (FOLVITE) 1 MG tablet TAKE 1 TABLET BY MOUTH EVERY DAY 90 tablet 0   loratadine (CLARITIN) 10 MG tablet Take 10 mg by mouth daily.     metFORMIN (GLUCOPHAGE) 500 MG tablet TAKE 1 TABLET BY MOUTH TWICE A DAY 60 tablet 1   Multiple Vitamin (MULTIVITAMIN WITH MINERALS) TABS tablet Take 1 tablet by mouth every morning.     omeprazole (PRILOSEC) 20 MG capsule TAKE 1 CAPSULE BY MOUTH EVERY DAY 90 capsule 0   ondansetron (ZOFRAN) 8 MG tablet TAKE 1 TABLET BY MOUTH BEFORE CHEMO 30 tablet 0   OVER THE  COUNTER MEDICATION Take 1 tablet by mouth every morning. (Vitamin A)     prochlorperazine (COMPAZINE) 10 MG tablet Take 1 tablet (10 mg total) by mouth every 6 (six) hours as needed for nausea or vomiting. 60 tablet 0   rosuvastatin (CRESTOR) 10 MG tablet Take 1 tablet (10 mg total) by mouth daily. 30 tablet 12   senna-docusate (SENOKOT-S) 8.6-50 MG tablet Take 1 tablet by mouth daily. 30 tablet prn   trimethoprim (TRIMPEX) 100 MG tablet Take 100 mg by mouth at bedtime.     Vitamin D, Ergocalciferol, (DRISDOL) 1.25 MG (50000 UNIT) CAPS capsule Take 50,000 Units by mouth once a week.     No current  facility-administered medications for this visit.    SURGICAL HISTORY:  Past Surgical History:  Procedure Laterality Date   ABDOMINAL HYSTERECTOMY     COLOSTOMY TAKEDOWN N/A 07/10/2014   Procedure: LAPAROSCOPIC LYSIS OF ADHESIONS (90 MIN) LAPAROSCOPIC ASSISTED COLOSTOMY CLOSURE, RIGID PROCTOSIGMOIDOSCOPY;  Surgeon: Jackolyn Confer, MD;  Location: [facility];  Service: General;  Laterality: N/A;   LAPAROTOMY N/A 11/03/2013   Procedure: EXPLORATORY LAPAROTOMY, DRAINAGE OF INTRA  ABDOMINAL ABSCESSES, MOBILIZATION OF SPLENIC FLEXURE, SIGMOID COLECTOMY WITH COLOSTOMY;  Surgeon: Odis Hollingshead, MD;  Location: [facility];  Service: General;  Laterality: N/A;   VIDEO BRONCHOSCOPY Bilateral 08/30/2013   Procedure: VIDEO BRONCHOSCOPY WITH FLUORO;  Surgeon: Tanda Rockers, MD;  Location: [facility];  Service: Cardiopulmonary;  Laterality: Bilateral;    REVIEW OF SYSTEMS:  Constitutional: positive for fatigue Eyes: negative Ears, nose, mouth, throat, and face: negative Respiratory: negative Cardiovascular: negative Gastrointestinal: negative Genitourinary:negative Integument/breast: negative Hematologic/lymphatic: negative Musculoskeletal:negative Neurological: negative Behavioral/Psych: negative Endocrine: negative Allergic/Immunologic: negative   PHYSICAL EXAMINATION: General appearance: alert, cooperative, fatigued, and no distress Head: Normocephalic, without obvious abnormality, atraumatic Neck: no adenopathy, no JVD, supple, symmetrical, trachea midline, and thyroid not enlarged, symmetric, no tenderness/mass/nodules Lymph nodes: Cervical, supraclavicular, and axillary nodes normal. Resp: clear to auscultation bilaterally Back: symmetric, no curvature. ROM normal. No CVA tenderness. Cardio: regular rate and rhythm, S1, S2 normal, no murmur, click, rub or gallop GI: soft, non-tender; bowel sounds normal; no masses,  no organomegaly Extremities: extremities normal, atraumatic, no cyanosis or  edema Neurologic: Alert and oriented X 3, normal strength and tone. Normal symmetric reflexes. Normal coordination and gait   ECOG PERFORMANCE STATUS: 1 - Symptomatic but completely ambulatory   There were no vitals taken for this visit.  LABORATORY DATA: Lab Results  Component Value Date   WBC 7.8 03/02/2022   HGB 11.8 (L) 03/02/2022   HCT 35.2 (L) 03/02/2022   MCV 102.0 (H) 03/02/2022   PLT 399 03/02/2022      Chemistry      Component Value Date/Time   NA 138 03/02/2022 1048   NA 137 08/31/2021 0900   NA 139 08/14/2017 0837   K 4.3 03/02/2022 1048   K 4.4 08/14/2017 0837   CL 105 03/02/2022 1048   CO2 26 03/02/2022 1048   CO2 24 08/14/2017 0837   BUN 15 03/02/2022 1048   BUN 24 08/31/2021 0900   BUN 13.3 08/14/2017 0837   CREATININE 1.00 03/02/2022 1048   CREATININE 0.80 11/16/2021 0805   CREATININE 0.8 08/14/2017 0837      Component Value Date/Time   CALCIUM 9.8 03/02/2022 1048   CALCIUM 9.3 08/14/2017 0837   ALKPHOS 69 03/02/2022 1048   ALKPHOS 107 08/14/2017 0837   AST 13 (L) 03/02/2022 1048   AST 11 (L) 11/16/2021 0805   AST  12 08/14/2017 0837   ALT 11 03/02/2022 1048   ALT 12 11/16/2021 0805   ALT 12 08/14/2017 0837   BILITOT 0.5 03/02/2022 1048   BILITOT 0.5 11/16/2021 0805   BILITOT 0.37 08/14/2017 0837       RADIOGRAPHIC STUDIES: CT Abdomen Pelvis W Contrast  Result Date: 03/11/2022 CLINICAL DATA:  Metastatic lung cancer restaging, ongoing chemotherapy, radiation complete * Tracking Code: BO * EXAM: CT CHEST, ABDOMEN, AND PELVIS WITH CONTRAST TECHNIQUE: Multidetector CT imaging of the chest, abdomen and pelvis was performed following the standard protocol during bolus administration of intravenous contrast. RADIATION DOSE REDUCTION: This exam was performed according to the departmental dose-optimization program which includes automated exposure control, adjustment of the mA and/or kV according to patient size and/or use of iterative reconstruction  technique. CONTRAST:  123m ISOVUE-300 IOPAMIDOL (ISOVUE-300) INJECTION 61%, additional oral enteric contrast COMPARISON:  12/21/2021 FINDINGS: CT CHEST FINDINGS Cardiovascular: Aortic atherosclerosis. Normal heart size. No pericardial effusion. Mediastinum/Nodes: No enlarged mediastinal, hilar, or axillary lymph nodes. Thyroid gland, trachea, and esophagus demonstrate no significant findings. Lungs/Pleura: Moderate centrilobular and paraseptal emphysema. Diffuse bilateral bronchial wall thickening. Unchanged post treatment appearance of the right pulmonary apex with dense consolidation and fibrosis (series 6, image 36). No pleural effusion or pneumothorax. Musculoskeletal: No chest wall mass or suspicious osseous lesions identified. CT ABDOMEN PELVIS FINDINGS Hepatobiliary: No solid liver abnormality is seen. No gallstones, gallbladder wall thickening, or biliary dilatation. Pancreas: Unremarkable. No pancreatic ductal dilatation or surrounding inflammatory changes. Spleen: Normal in size without significant abnormality. Adrenals/Urinary Tract: Adrenal glands are unremarkable. Kidneys are normal, without renal calculi, solid lesion, or hydronephrosis. Bladder is unremarkable. Stomach/Bowel: Stomach is within normal limits. Appendix appears normal. No evidence of bowel wall thickening, distention, or inflammatory changes. Sigmoid colon resection and reanastomosis. Vascular/Lymphatic: Aortic atherosclerosis. No enlarged abdominal or pelvic lymph nodes. Reproductive: Status post hysterectomy. Other: Small, fat containing umbilical hernia.  No ascites. Musculoskeletal: No acute osseous findings. IMPRESSION: 1. Unchanged post treatment appearance of the right pulmonary apex with dense consolidation and fibrosis. 2. No evidence of lymphadenopathy or metastatic disease in the chest, abdomen, or pelvis. 3. Emphysema diffuse bilateral bronchial wall thickening. 4. Sigmoid colon resection and reanastomosis. Aortic  Atherosclerosis (ICD10-I70.0) and Emphysema (ICD10-J43.9). Electronically Signed   By: ADelanna AhmadiM.D.   On: 03/11/2022 14:57   CT Chest W Contrast  Result Date: 03/11/2022 CLINICAL DATA:  Metastatic lung cancer restaging, ongoing chemotherapy, radiation complete * Tracking Code: BO * EXAM: CT CHEST, ABDOMEN, AND PELVIS WITH CONTRAST TECHNIQUE: Multidetector CT imaging of the chest, abdomen and pelvis was performed following the standard protocol during bolus administration of intravenous contrast. RADIATION DOSE REDUCTION: This exam was performed according to the departmental dose-optimization program which includes automated exposure control, adjustment of the mA and/or kV according to patient size and/or use of iterative reconstruction technique. CONTRAST:  1057mISOVUE-300 IOPAMIDOL (ISOVUE-300) INJECTION 61%, additional oral enteric contrast COMPARISON:  12/21/2021 FINDINGS: CT CHEST FINDINGS Cardiovascular: Aortic atherosclerosis. Normal heart size. No pericardial effusion. Mediastinum/Nodes: No enlarged mediastinal, hilar, or axillary lymph nodes. Thyroid gland, trachea, and esophagus demonstrate no significant findings. Lungs/Pleura: Moderate centrilobular and paraseptal emphysema. Diffuse bilateral bronchial wall thickening. Unchanged post treatment appearance of the right pulmonary apex with dense consolidation and fibrosis (series 6, image 36). No pleural effusion or pneumothorax. Musculoskeletal: No chest wall mass or suspicious osseous lesions identified. CT ABDOMEN PELVIS FINDINGS Hepatobiliary: No solid liver abnormality is seen. No gallstones, gallbladder wall thickening, or biliary dilatation. Pancreas: Unremarkable. No  pancreatic ductal dilatation or surrounding inflammatory changes. Spleen: Normal in size without significant abnormality. Adrenals/Urinary Tract: Adrenal glands are unremarkable. Kidneys are normal, without renal calculi, solid lesion, or hydronephrosis. Bladder is  unremarkable. Stomach/Bowel: Stomach is within normal limits. Appendix appears normal. No evidence of bowel wall thickening, distention, or inflammatory changes. Sigmoid colon resection and reanastomosis. Vascular/Lymphatic: Aortic atherosclerosis. No enlarged abdominal or pelvic lymph nodes. Reproductive: Status post hysterectomy. Other: Small, fat containing umbilical hernia.  No ascites. Musculoskeletal: No acute osseous findings. IMPRESSION: 1. Unchanged post treatment appearance of the right pulmonary apex with dense consolidation and fibrosis. 2. No evidence of lymphadenopathy or metastatic disease in the chest, abdomen, or pelvis. 3. Emphysema diffuse bilateral bronchial wall thickening. 4. Sigmoid colon resection and reanastomosis. Aortic Atherosclerosis (ICD10-I70.0) and Emphysema (ICD10-J43.9). Electronically Signed   By: Delanna Ahmadi M.D.   On: 03/11/2022 14:57    ASSESSMENT AND PLAN:  This is a very pleasant 71 years old white female with metastatic non-small cell lung cancer, adenocarcinoma status post induction systemic chemotherapy with carboplatin and Alimta with partial response.  The patient has no actionable EGFR or ALK mutations at the time of her diagnosis. The patient is currently on maintenance treatment with single agent Alimta status post 134 cycles. She has been tolerating her treatment with maintenance Alimta fairly well with no concerning adverse effects. She had repeat CT scan of the chest, abdomen and pelvis performed recently.  I personally and independently reviewed the scan and discussed the result with the patient and her husband. Her scan showed no concerning findings for disease progression. I recommended for the patient to continue her current treatment with maintenance Alimta and she will proceed with cycle #135 today. I will see her back for follow-up visit in 3 weeks for evaluation before the next cycle of her treatment. For the anxiety she is currently on  Xanax. The patient was advised to call immediately if she has any other concerning symptoms in the interval. The patient voices understanding of current disease status and treatment options and is in agreement with the current care plan. All questions were answered. The patient knows to call the clinic with any problems, questions or concerns. We can certainly see the patient much sooner if necessary.  Disclaimer: This note was dictated with voice recognition software. Similar sounding words can inadvertently be transcribed and may not be corrected upon review.

## 2022-03-23 ENCOUNTER — Other Ambulatory Visit: Payer: Self-pay | Admitting: Nurse Practitioner

## 2022-03-23 ENCOUNTER — Other Ambulatory Visit: Payer: Self-pay

## 2022-03-23 DIAGNOSIS — E119 Type 2 diabetes mellitus without complications: Secondary | ICD-10-CM

## 2022-03-24 ENCOUNTER — Other Ambulatory Visit: Payer: Self-pay

## 2022-04-01 NOTE — Progress Notes (Signed)
[facility] OFFICE PROGRESS NOTE  [name], NP [place] [address]  DIAGNOSIS: Stage IV (T2a, N0, M1b) non-small cell lung cancer, adenocarcinoma with negative EGFR and ALK mutations diagnosed in January 2015 and presented with right upper lobe lung mass in addition to a solitary brain metastasis  PRIOR THERAPY: 1) Status post stereotactic radiotherapy to a solitary right parietal brain lesion under the care of Dr. [name] on 10/16/2013. 2) Status post palliative radiotherapy to the right lung tumor under the care of Dr. [name] completed on 12/05/2013. 3) Systemic chemotherapy with carboplatin for AUC of 5 and Alimta 500 mg/M2 every 3 weeks. First dose Jan 06 2014. Status post 6 cycles.  CURRENT THERAPY: Systemic chemotherapy with maintenance Alimta 500 MG/M2 every 3 weeks, status post 135 cycles.  INTERVAL HISTORY: Alexis [name] 71 y.o. female returns to the clinic today for a follow-up visit.  The patient is feeling fairly well today without any concerning complaints except she felt weak last week. She also gets recurrent urinary tract infections for which she sees Dr. Jeffie Pollock from urology.  She is currently undergoing treatment with Pyridium and Macrobid for a new infection.  Her symptoms have improved at this time.  She was previously on trimethoprim for 6 months.  She went to an urgent care on Friday, 04/07/2022 and started antibiotics at this time.  She is currently undergoing single agent chemotherapy with Alimta.  She denies any fever, chills, night sweats, or unexplained weight loss. She denies any chest pain, cough, or hemoptysis.  She denies any significant or worsening shortness of breath with exertion except for mild shortness of breath with certain activities.  Denies any nausea, vomiting, or constipation. She does report she sometimes has loose stool after eating. Her symptoms improve with imodium. Denies abdominal pain.  Denies any  headache or visual changes. She is wondering if she can go on a cruise in September and delay treatment for cycle #137 until 05/16/22.  She is here today for evaluation and repeat blood work before starting cycle 136.    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  MEDICATIONS:  Current Outpatient Medications  Medication Sig Dispense Refill   acetaminophen (TYLENOL) 500 MG tablet Take 500 mg by mouth every 6 (six) hours as needed for mild pain or headache. Reported on 11/02/2015     ALPRAZolam (XANAX) 1 MG tablet Take 0.5 tablets (0.5 mg total) by mouth 2 (two) times daily as needed for anxiety. 30 tablet 2   Ascorbic Acid (VITAMIN C GUMMIE PO) Take 1 each by mouth every morning.     aspirin EC 81 MG tablet Take 1 tablet (81 mg total) by mouth daily. 150 tablet 2   [employer] ACID CONTROLLER 10 MG tablet SMARTSIG:1 Tablet(s) By Mouth Every 12 Hours PRN     dexamethasone (DECADRON) 4 MG tablet TAKE 1 TABLET TWICE A DAY THE DAY BEFORE, THE DAY OF, AND THE DAY AFTER CHEMOTHERAPY. 40 tablet 2   folic acid (FOLVITE) 1 MG tablet TAKE 1 TABLET BY MOUTH EVERY  DAY 90 tablet 0   loratadine (CLARITIN) 10 MG tablet Take 10 mg by mouth daily.     metFORMIN (GLUCOPHAGE) 500 MG tablet TAKE 1 TABLET BY MOUTH TWICE A DAY 60 tablet 1   Multiple Vitamin (MULTIVITAMIN WITH MINERALS) TABS tablet Take 1 tablet by mouth every morning.     nitrofurantoin, macrocrystal-monohydrate, (MACROBID) 100 MG capsule Take 100 mg by mouth 2 (two) times daily.     omeprazole (PRILOSEC) 20 MG capsule TAKE 1 CAPSULE BY MOUTH EVERY DAY 90 capsule 0   ondansetron (ZOFRAN) 8 MG  tablet TAKE 1 TABLET BY MOUTH BEFORE CHEMO 30 tablet 0   OVER THE COUNTER MEDICATION Take 1 tablet by mouth every morning. (Vitamin A)     phenazopyridine (PYRIDIUM) 200 MG tablet Take 200 mg by mouth every 8 (eight) hours as needed.     prochlorperazine (COMPAZINE) 10 MG tablet Take 1 tablet (10 mg total) by mouth every 6 (six) hours as needed for nausea or vomiting. 60 tablet 0   rosuvastatin (CRESTOR) 10 MG tablet Take 1 tablet (10 mg total) by mouth daily. 30 tablet 12   trimethoprim (TRIMPEX) 100 MG tablet Take 100 mg by mouth at bedtime.     Vitamin D, Ergocalciferol, (DRISDOL) 1.25 MG (50000 UNIT) CAPS capsule Take 50,000 Units by mouth once a week.     senna-docusate (SENOKOT-S) 8.6-50 MG tablet Take 1 tablet by mouth daily. (Patient not taking: Reported on 04/11/2022) 30 tablet prn   No current facility-administered medications for this visit.    SURGICAL HISTORY:  Past Surgical History:  Procedure Laterality Date   ABDOMINAL HYSTERECTOMY     COLOSTOMY TAKEDOWN N/A 07/10/2014   Procedure: LAPAROSCOPIC LYSIS OF ADHESIONS (90 MIN) LAPAROSCOPIC ASSISTED COLOSTOMY CLOSURE, RIGID PROCTOSIGMOIDOSCOPY;  Surgeon: Todd Rosenbower, MD;  Location: [facility];  Service: General;  Laterality: N/A;   LAPAROTOMY N/A 11/03/2013   Procedure: EXPLORATORY LAPAROTOMY, DRAINAGE OF INTRA  ABDOMINAL ABSCESSES, MOBILIZATION OF SPLENIC FLEXURE, SIGMOID COLECTOMY WITH COLOSTOMY;  Surgeon: Todd J Rosenbower, MD;  Location: [facility];  Service: General;  Laterality: N/A;   VIDEO BRONCHOSCOPY Bilateral 08/30/2013   Procedure: VIDEO BRONCHOSCOPY WITH FLUORO;  Surgeon: Michael B Wert, MD;  Location: [facility];  Service: Cardiopulmonary;  Laterality: Bilateral;    REVIEW OF SYSTEMS:   Constitutional: Negative for appetite change, chills, fatigue, fever and unexpected weight change.  HENT: Negative for mouth sores, nosebleeds, sore throat and trouble swallowing.   Eyes: Negative for eye problems and icterus.   Respiratory: Negative for cough, hemoptysis, shortness of breath and wheezing.   Cardiovascular: Negative for chest pain and leg swelling.  Gastrointestinal: Positive for baseline intermittent diarrhea. Negative for abdominal pain, constipation, nausea and vomiting.  Genitourinary: Positive for improved dysuria. Negative for bladder incontinence, difficulty urinating, dysuria, frequency and hematuria.   Musculoskeletal: Negative for back pain (improved), gait problem, neck pain and neck stiffness.  Skin: Negative for itching and rash.  Neurological: Negative for dizziness, extremity weakness, gait problem, headaches, light-headedness and seizures.  Hematological: Negative for adenopathy. Does not bruise/bleed easily.  Psychiatric/Behavioral: Negative for confusion, depression and sleep disturbance. The patient is not nervous/anxious.   PHYSICAL EXAMINATION:  Blood pressure (!) 144/73, pulse (!) 109, temperature 98.1 F (36.7 C), temperature source Temporal, resp. rate 18, height 5' 4" (1.626 m), weight 157 lb 6.4 oz (71.4 kg), SpO2 99 %.  ECOG PERFORMANCE STATUS: 1  Physical Exam  Constitutional: Oriented to person, place, and time and well-developed, well-nourished, and in no distress.    HENT:  Head: Normocephalic and atraumatic.  Mouth/Throat: Oropharynx is clear and moist. No oropharyngeal exudate.  Eyes: Conjunctivae are normal. Right eye exhibits no discharge. Left eye exhibits no discharge. No scleral icterus.  Neck: Normal range of motion. Neck supple.  Cardiovascular: Normal rate, regular rhythm, normal heart sounds and intact distal pulses.   Pulmonary/Chest: Effort normal and breath sounds normal. No respiratory distress. No wheezes. No rales.  Abdominal: Soft. Bowel sounds are normal. Exhibits no distension and no mass. There is no tenderness.  Musculoskeletal: Normal range of motion. Exhibits no edema.  Lymphadenopathy:    No cervical adenopathy.  Neurological: Alert and  oriented to person, place, and time. Exhibits normal muscle tone. Gait normal. Coordination normal.  Skin: Skin is warm and dry. No rash noted. Not diaphoretic. No erythema. No pallor.  Psychiatric: Mood, memory and judgment normal.  Vitals reviewed.  LABORATORY DATA: Lab Results  Component Value Date   WBC 11.1 (H) 04/11/2022   HGB 10.8 (L) 04/11/2022   HCT 32.7 (L) 04/11/2022   MCV 102.2 (H) 04/11/2022   PLT 351 04/11/2022      Chemistry      Component Value Date/Time   NA 137 04/11/2022 0955   NA 137 08/31/2021 0900   NA 139 08/14/2017 0837   K 3.8 04/11/2022 0955   K 4.4 08/14/2017 0837   CL 104 04/11/2022 0955   CO2 25 04/11/2022 0955   CO2 24 08/14/2017 0837   BUN 19 04/11/2022 0955   BUN 24 08/31/2021 0900   BUN 13.3 08/14/2017 0837   CREATININE 0.79 04/11/2022 0955   CREATININE 0.80 11/16/2021 0805   CREATININE 0.8 08/14/2017 0837      Component Value Date/Time   CALCIUM 9.5 04/11/2022 0955   CALCIUM 9.3 08/14/2017 0837   ALKPHOS 68 04/11/2022 0955   ALKPHOS 107 08/14/2017 0837   AST 10 (L) 04/11/2022 0955   AST 11 (L) 11/16/2021 0805   AST 12 08/14/2017 0837   ALT 10 04/11/2022 0955   ALT 12 11/16/2021 0805   ALT 12 08/14/2017 0837   BILITOT 0.4 04/11/2022 0955   BILITOT 0.5 11/16/2021 0805   BILITOT 0.37 08/14/2017 0837       RADIOGRAPHIC STUDIES:  No results found.   ASSESSMENT/PLAN:  This is a very pleasant 71 year old Caucasian female with stage IV non-small cell lung cancer, adenocarcinoma who presented with a right upper lobe lung mass in addition to a solitary brain metastatsis.  She was diagnosed in January 2015.    The patient had completed induction systemic chemotherapy with carboplatin and Alimta with a partial response.   The patient is currently being treated with single  agent maintenance Alimta.  She is status post 135 cycles.    Labs were reviewed. Recommend that she  proceed with #136 today scheduled.   We will see her back  for follow-up visit in ~05/16/22 for evaluation before starting cycle #137. It is ok to delay treatment so she may go on her cruise.    Advised that the patient may want to take imodium in the mornings to stay ahead of her diarrhea. She may also continue to take it after loose stools as she has been taking it.    Told the patient to let us know if her possible upcoming cruise interferes with her infusion appointments. We would be happy to delay her infusion by a week or so if needed for any upcoming traveling.   She will continue her antibiotic for her  UTI. Her symptoms have improved since starting her antibiotic. She will continue to follow with Dr. Jeffie Pollock. We reviewed signs and symptoms to warrant re-evaluation including abdominal pain, fevers, chills, night sweats, nausea/vomiting, back pain, and recurrent dysuria.    The patient was advised to call immediately if she has any concerning symptoms in the interval. The patient voices understanding of current disease status and treatment options and is in agreement with the current care plan. All questions were answered. The patient knows to call the clinic with any problems, questions or concerns. We can certainly see the patient much sooner if necessary         No orders of the defined types were placed in this encounter.    The total time spent in the appointment was 20-29 minutes.   Erna Brossard L Mitchael Luckey, PA-C 04/11/22

## 2022-04-04 DIAGNOSIS — N390 Urinary tract infection, site not specified: Secondary | ICD-10-CM | POA: Diagnosis not present

## 2022-04-04 DIAGNOSIS — N3001 Acute cystitis with hematuria: Secondary | ICD-10-CM | POA: Diagnosis not present

## 2022-04-04 DIAGNOSIS — H6123 Impacted cerumen, bilateral: Secondary | ICD-10-CM | POA: Diagnosis not present

## 2022-04-08 MED FILL — Dexamethasone Sodium Phosphate Inj 100 MG/10ML: INTRAMUSCULAR | Qty: 1 | Status: AC

## 2022-04-11 ENCOUNTER — Other Ambulatory Visit: Payer: Self-pay | Admitting: Physician Assistant

## 2022-04-11 ENCOUNTER — Encounter: Payer: Self-pay | Admitting: Physician Assistant

## 2022-04-11 ENCOUNTER — Other Ambulatory Visit: Payer: Self-pay

## 2022-04-11 ENCOUNTER — Inpatient Hospital Stay: Payer: Medicare Other

## 2022-04-11 ENCOUNTER — Inpatient Hospital Stay (HOSPITAL_BASED_OUTPATIENT_CLINIC_OR_DEPARTMENT_OTHER): Payer: Medicare Other | Admitting: Physician Assistant

## 2022-04-11 ENCOUNTER — Inpatient Hospital Stay: Payer: Medicare Other | Attending: Internal Medicine

## 2022-04-11 ENCOUNTER — Other Ambulatory Visit: Payer: Self-pay | Admitting: Internal Medicine

## 2022-04-11 VITALS — BP 144/73 | HR 109 | Temp 98.1°F | Resp 18 | Ht 64.0 in | Wt 157.4 lb

## 2022-04-11 VITALS — BP 133/72 | HR 87 | Resp 16

## 2022-04-11 DIAGNOSIS — Z5111 Encounter for antineoplastic chemotherapy: Secondary | ICD-10-CM | POA: Diagnosis not present

## 2022-04-11 DIAGNOSIS — C7931 Secondary malignant neoplasm of brain: Secondary | ICD-10-CM | POA: Diagnosis not present

## 2022-04-11 DIAGNOSIS — C3411 Malignant neoplasm of upper lobe, right bronchus or lung: Secondary | ICD-10-CM

## 2022-04-11 LAB — CBC WITH DIFFERENTIAL/PLATELET
Abs Immature Granulocytes: 0.15 10*3/uL — ABNORMAL HIGH (ref 0.00–0.07)
Basophils Absolute: 0 10*3/uL (ref 0.0–0.1)
Basophils Relative: 0 %
Eosinophils Absolute: 0 10*3/uL (ref 0.0–0.5)
Eosinophils Relative: 0 %
HCT: 32.7 % — ABNORMAL LOW (ref 36.0–46.0)
Hemoglobin: 10.8 g/dL — ABNORMAL LOW (ref 12.0–15.0)
Immature Granulocytes: 1 %
Lymphocytes Relative: 5 %
Lymphs Abs: 0.5 10*3/uL — ABNORMAL LOW (ref 0.7–4.0)
MCH: 33.8 pg (ref 26.0–34.0)
MCHC: 33 g/dL (ref 30.0–36.0)
MCV: 102.2 fL — ABNORMAL HIGH (ref 80.0–100.0)
Monocytes Absolute: 0.5 10*3/uL (ref 0.1–1.0)
Monocytes Relative: 5 %
Neutro Abs: 9.9 10*3/uL — ABNORMAL HIGH (ref 1.7–7.7)
Neutrophils Relative %: 89 %
Platelets: 351 10*3/uL (ref 150–400)
RBC: 3.2 MIL/uL — ABNORMAL LOW (ref 3.87–5.11)
RDW: 14.7 % (ref 11.5–15.5)
WBC: 11.1 10*3/uL — ABNORMAL HIGH (ref 4.0–10.5)
nRBC: 0 % (ref 0.0–0.2)

## 2022-04-11 LAB — COMPREHENSIVE METABOLIC PANEL
ALT: 10 U/L (ref 0–44)
AST: 10 U/L — ABNORMAL LOW (ref 15–41)
Albumin: 4.1 g/dL (ref 3.5–5.0)
Alkaline Phosphatase: 68 U/L (ref 38–126)
Anion gap: 8 (ref 5–15)
BUN: 19 mg/dL (ref 8–23)
CO2: 25 mmol/L (ref 22–32)
Calcium: 9.5 mg/dL (ref 8.9–10.3)
Chloride: 104 mmol/L (ref 98–111)
Creatinine, Ser: 0.79 mg/dL (ref 0.44–1.00)
GFR, Estimated: >60 mL/min (ref >60)
Glucose, Bld: 135 mg/dL — ABNORMAL HIGH (ref 70–99)
Potassium: 3.8 mmol/L (ref 3.5–5.1)
Sodium: 137 mmol/L (ref 135–145)
Total Bilirubin: 0.4 mg/dL (ref 0.3–1.2)
Total Protein: 7 g/dL (ref 6.5–8.1)

## 2022-04-11 MED ORDER — PROCHLORPERAZINE MALEATE 10 MG PO TABS: 10.0000 mg | ORAL_TABLET | Freq: Once | ORAL | Status: DC

## 2022-04-11 MED ORDER — CYANOCOBALAMIN 1000 MCG/ML IJ SOLN
1000.0000 ug | Freq: Once | INTRAMUSCULAR | Status: AC
  Administered 2022-04-11: 1000 ug via INTRAMUSCULAR

## 2022-04-11 MED ORDER — SODIUM CHLORIDE 0.9 % IV SOLN
500.0000 mg/m2 | Freq: Once | INTRAVENOUS | Status: AC
  Administered 2022-04-11: 900 mg via INTRAVENOUS
  Filled 2022-04-11: qty 20

## 2022-04-11 MED ORDER — SODIUM CHLORIDE 0.9 % IV SOLN: Freq: Once | INTRAVENOUS | Status: AC

## 2022-04-11 MED ORDER — SODIUM CHLORIDE 0.9 % IV SOLN
10.0000 mg | Freq: Once | INTRAVENOUS | Status: AC
  Administered 2022-04-11: 10 mg via INTRAVENOUS
  Filled 2022-04-11: qty 10

## 2022-04-12 ENCOUNTER — Telehealth: Payer: Self-pay | Admitting: Internal Medicine

## 2022-04-12 NOTE — Telephone Encounter (Signed)
Scheduled per 08/14 los, patient has been called and notified.

## 2022-04-13 ENCOUNTER — Other Ambulatory Visit: Payer: Self-pay

## 2022-04-19 ENCOUNTER — Other Ambulatory Visit: Payer: Self-pay | Admitting: Nurse Practitioner

## 2022-04-19 DIAGNOSIS — E119 Type 2 diabetes mellitus without complications: Secondary | ICD-10-CM

## 2022-04-19 NOTE — Progress Notes (Unsigned)
Established patient visit   Patient: Alexis [name]   DOB: 06/10/1951   71 y.o. Female  MRN: [id number] Visit Date: 04/20/2022   No chief complaint on file.  Subjective    HPI  Follow up  -type 2 diabetes  -due to have check HgbA1c --most recent HgbA1c done 01/17/2022 - 5.7  -does have generalized anxiety. Takes alprazolam 0.5 mg twice daily as needed. Needs new prescription today.  -Will need to have non opioid agreement signed to have on file in her chart   Medications: Outpatient Medications Prior to Visit  Medication Sig   acetaminophen (TYLENOL) 500 MG tablet Take 500 mg by mouth every 6 (six) hours as needed for mild pain or headache. Reported on 11/02/2015   ALPRAZolam (XANAX) 1 MG tablet Take 0.5 tablets (0.5 mg total) by mouth 2 (two) times daily as needed for anxiety.   Ascorbic Acid (VITAMIN C GUMMIE PO) Take 1 each by mouth every morning.   aspirin EC 81 MG tablet Take 1 tablet (81 mg total) by mouth daily.   [employer] ACID CONTROLLER 10 MG tablet SMARTSIG:1 Tablet(s) By Mouth Every 12 Hours PRN   dexamethasone (DECADRON) 4 MG tablet TAKE 1 TABLET TWICE A DAY THE DAY BEFORE, THE DAY OF, AND THE DAY AFTER CHEMOTHERAPY.   folic acid (FOLVITE) 1 MG tablet TAKE 1 TABLET BY MOUTH EVERY DAY   loratadine (CLARITIN) 10 MG tablet Take 10 mg by mouth daily.   metFORMIN (GLUCOPHAGE) 500 MG tablet TAKE 1 TABLET BY MOUTH TWICE A DAY   Multiple Vitamin (MULTIVITAMIN WITH MINERALS) TABS tablet Take 1 tablet by mouth every morning.   nitrofurantoin, macrocrystal-monohydrate, (MACROBID) 100 MG capsule Take 100 mg by mouth 2 (two) times daily.   omeprazole (PRILOSEC) 20 MG capsule TAKE 1 CAPSULE BY MOUTH EVERY DAY   ondansetron (ZOFRAN) 8 MG tablet TAKE 1 TABLET BY MOUTH BEFORE CHEMO   OVER THE COUNTER MEDICATION Take 1 tablet by mouth every morning. (Vitamin A)   phenazopyridine (PYRIDIUM) 200 MG tablet Take 200 mg by mouth every 8 (eight) hours as needed.   prochlorperazine (COMPAZINE) 10 MG  tablet Take 1 tablet (10 mg total) by mouth every 6 (six) hours as needed for nausea or vomiting.   rosuvastatin (CRESTOR) 10 MG tablet Take 1 tablet (10 mg total) by mouth daily.   senna-docusate (SENOKOT-S) 8.6-50 MG tablet Take 1 tablet by mouth daily. (Patient not taking: Reported on 04/11/2022)   trimethoprim (TRIMPEX) 100 MG tablet Take 100 mg by mouth at bedtime.   Vitamin D, Ergocalciferol, (DRISDOL) 1.25 MG (50000 UNIT) CAPS capsule Take 50,000 Units by mouth once a week.   No facility-administered medications prior to visit.    Review of Systems  {Labs (Optional):23779}   Objective    There were no vitals taken for this visit. BP Readings from Last 3 Encounters:  04/11/22 133/72  04/11/22 (!) 144/73  03/22/22 139/63    Wt Readings from Last 3 Encounters:  04/11/22 157 lb 6.4 oz (71.4 kg)  03/22/22 157 lb 3.2 oz (71.3 kg)  03/02/22 155 lb 7 oz (70.5 kg)    Physical Exam  ***  No results found for any visits on 04/20/22.  Assessment & Plan     Problem List Items Addressed This Visit   None    No follow-ups on file.         [name], NP  [facility] [phone] (phone) [phone] (fax)  [facility]

## 2022-04-20 ENCOUNTER — Encounter: Payer: Self-pay | Admitting: Nurse Practitioner

## 2022-04-20 ENCOUNTER — Ambulatory Visit (INDEPENDENT_AMBULATORY_CARE_PROVIDER_SITE_OTHER): Payer: Medicare Other | Admitting: Nurse Practitioner

## 2022-04-20 VITALS — BP 101/65 | HR 100 | Temp 97.7°F | Ht 64.5 in | Wt 154.0 lb

## 2022-04-20 DIAGNOSIS — T3695XA Adverse effect of unspecified systemic antibiotic, initial encounter: Secondary | ICD-10-CM

## 2022-04-20 DIAGNOSIS — N390 Urinary tract infection, site not specified: Secondary | ICD-10-CM | POA: Insufficient documentation

## 2022-04-20 DIAGNOSIS — B379 Candidiasis, unspecified: Secondary | ICD-10-CM

## 2022-04-20 DIAGNOSIS — E785 Hyperlipidemia, unspecified: Secondary | ICD-10-CM | POA: Diagnosis not present

## 2022-04-20 DIAGNOSIS — R3989 Other symptoms and signs involving the genitourinary system: Secondary | ICD-10-CM | POA: Insufficient documentation

## 2022-04-20 DIAGNOSIS — C3411 Malignant neoplasm of upper lobe, right bronchus or lung: Secondary | ICD-10-CM | POA: Diagnosis not present

## 2022-04-20 DIAGNOSIS — E119 Type 2 diabetes mellitus without complications: Secondary | ICD-10-CM | POA: Diagnosis not present

## 2022-04-20 DIAGNOSIS — E1169 Type 2 diabetes mellitus with other specified complication: Secondary | ICD-10-CM

## 2022-04-20 DIAGNOSIS — F411 Generalized anxiety disorder: Secondary | ICD-10-CM | POA: Diagnosis not present

## 2022-04-20 LAB — POCT GLYCOSYLATED HEMOGLOBIN (HGB A1C): Hemoglobin A1C: 5.9 % — AB (ref 4.0–5.6)

## 2022-04-20 MED ORDER — FLUCONAZOLE 150 MG PO TABS: ORAL_TABLET | ORAL | 1 refills | Status: DC

## 2022-04-20 MED ORDER — ALPRAZOLAM 1 MG PO TABS: 0.5000 mg | ORAL_TABLET | Freq: Two times a day (BID) | ORAL | 2 refills | Status: DC | PRN

## 2022-04-20 MED ORDER — SULFAMETHOXAZOLE-TRIMETHOPRIM 800-160 MG PO TABS: 1.0000 | ORAL_TABLET | Freq: Two times a day (BID) | ORAL | 0 refills | Status: DC

## 2022-04-20 MED ORDER — PHENAZOPYRIDINE HCL 200 MG PO TABS: 200.0000 mg | ORAL_TABLET | Freq: Three times a day (TID) | ORAL | 0 refills | Status: DC | PRN

## 2022-04-28 ENCOUNTER — Other Ambulatory Visit: Payer: Self-pay | Admitting: Radiation Therapy

## 2022-04-28 DIAGNOSIS — C7931 Secondary malignant neoplasm of brain: Secondary | ICD-10-CM

## 2022-04-29 ENCOUNTER — Other Ambulatory Visit: Payer: Self-pay

## 2022-05-03 ENCOUNTER — Other Ambulatory Visit: Payer: Self-pay

## 2022-05-03 DIAGNOSIS — C3411 Malignant neoplasm of upper lobe, right bronchus or lung: Secondary | ICD-10-CM

## 2022-05-03 MED ORDER — ONDANSETRON HCL 8 MG PO TABS: ORAL_TABLET | ORAL | 1 refills | Status: DC

## 2022-05-03 NOTE — Telephone Encounter (Signed)
Pt husband called to request a refill of Zofran.

## 2022-05-04 ENCOUNTER — Inpatient Hospital Stay: Payer: Medicare Other

## 2022-05-04 ENCOUNTER — Telehealth: Payer: Self-pay | Admitting: Nurse Practitioner

## 2022-05-04 ENCOUNTER — Telehealth: Payer: Self-pay

## 2022-05-04 ENCOUNTER — Inpatient Hospital Stay: Payer: Medicare Other | Admitting: Internal Medicine

## 2022-05-04 NOTE — Telephone Encounter (Signed)
Pts husband LM requesting a refill of Zofran.  Refill has been sent and pt confirmed she has picked it up.

## 2022-05-04 NOTE — Telephone Encounter (Signed)
Patient would like to speak with you about one of her medications that could be causing a reoccurring UTI. Please advise.

## 2022-05-09 NOTE — Telephone Encounter (Signed)
Yes metformin

## 2022-05-09 NOTE — Telephone Encounter (Signed)
Is she talking about her Metformin? I don't see where she has been treated for frequent UTI.

## 2022-05-13 MED FILL — Dexamethasone Sodium Phosphate Inj 100 MG/10ML: INTRAMUSCULAR | Qty: 1 | Status: AC

## 2022-05-16 ENCOUNTER — Inpatient Hospital Stay: Payer: Medicare Other

## 2022-05-16 ENCOUNTER — Other Ambulatory Visit: Payer: Self-pay

## 2022-05-16 ENCOUNTER — Inpatient Hospital Stay (HOSPITAL_BASED_OUTPATIENT_CLINIC_OR_DEPARTMENT_OTHER): Payer: Medicare Other | Admitting: Internal Medicine

## 2022-05-16 ENCOUNTER — Inpatient Hospital Stay: Payer: Medicare Other | Attending: Internal Medicine

## 2022-05-16 VITALS — BP 140/71

## 2022-05-16 VITALS — HR 94 | Temp 97.4°F | Resp 15 | Wt 159.2 lb

## 2022-05-16 DIAGNOSIS — C7931 Secondary malignant neoplasm of brain: Secondary | ICD-10-CM | POA: Diagnosis not present

## 2022-05-16 DIAGNOSIS — C3411 Malignant neoplasm of upper lobe, right bronchus or lung: Secondary | ICD-10-CM

## 2022-05-16 DIAGNOSIS — Z79899 Other long term (current) drug therapy: Secondary | ICD-10-CM | POA: Diagnosis not present

## 2022-05-16 DIAGNOSIS — Z5111 Encounter for antineoplastic chemotherapy: Secondary | ICD-10-CM | POA: Diagnosis not present

## 2022-05-16 DIAGNOSIS — C3490 Malignant neoplasm of unspecified part of unspecified bronchus or lung: Secondary | ICD-10-CM

## 2022-05-16 DIAGNOSIS — F419 Anxiety disorder, unspecified: Secondary | ICD-10-CM | POA: Insufficient documentation

## 2022-05-16 LAB — CBC WITH DIFFERENTIAL (CANCER CENTER ONLY)
Abs Immature Granulocytes: 0.03 10*3/uL (ref 0.00–0.07)
Basophils Absolute: 0 10*3/uL (ref 0.0–0.1)
Basophils Relative: 0 %
Eosinophils Absolute: 0 10*3/uL (ref 0.0–0.5)
Eosinophils Relative: 0 %
HCT: 35.5 % — ABNORMAL LOW (ref 36.0–46.0)
Hemoglobin: 11.3 g/dL — ABNORMAL LOW (ref 12.0–15.0)
Immature Granulocytes: 1 %
Lymphocytes Relative: 7 %
Lymphs Abs: 0.4 10*3/uL — ABNORMAL LOW (ref 0.7–4.0)
MCH: 32.8 pg (ref 26.0–34.0)
MCHC: 31.8 g/dL (ref 30.0–36.0)
MCV: 102.9 fL — ABNORMAL HIGH (ref 80.0–100.0)
Monocytes Absolute: 0.3 10*3/uL (ref 0.1–1.0)
Monocytes Relative: 5 %
Neutro Abs: 5.4 10*3/uL (ref 1.7–7.7)
Neutrophils Relative %: 87 %
Platelet Count: 273 10*3/uL (ref 150–400)
RBC: 3.45 MIL/uL — ABNORMAL LOW (ref 3.87–5.11)
RDW: 14.3 % (ref 11.5–15.5)
WBC Count: 6.2 10*3/uL (ref 4.0–10.5)
nRBC: 0 % (ref 0.0–0.2)

## 2022-05-16 LAB — CMP (CANCER CENTER ONLY)
ALT: 12 U/L (ref 0–44)
AST: 16 U/L (ref 15–41)
Albumin: 3.6 g/dL (ref 3.5–5.0)
Alkaline Phosphatase: 60 U/L (ref 38–126)
Anion gap: 8 (ref 5–15)
BUN: 14 mg/dL (ref 8–23)
CO2: 24 mmol/L (ref 22–32)
Calcium: 9.2 mg/dL (ref 8.9–10.3)
Chloride: 109 mmol/L (ref 98–111)
Creatinine: 0.69 mg/dL (ref 0.44–1.00)
GFR, Estimated: >60 mL/min (ref >60)
Glucose, Bld: 147 mg/dL — ABNORMAL HIGH (ref 70–99)
Potassium: 3.8 mmol/L (ref 3.5–5.1)
Sodium: 141 mmol/L (ref 135–145)
Total Bilirubin: 0.4 mg/dL (ref 0.3–1.2)
Total Protein: 6.8 g/dL (ref 6.5–8.1)

## 2022-05-16 MED ORDER — SODIUM CHLORIDE 0.9 % IV SOLN
10.0000 mg | Freq: Once | INTRAVENOUS | Status: AC
  Administered 2022-05-16: 10 mg via INTRAVENOUS
  Filled 2022-05-16: qty 10

## 2022-05-16 MED ORDER — SODIUM CHLORIDE 0.9 % IV SOLN
500.0000 mg/m2 | Freq: Once | INTRAVENOUS | Status: AC
  Administered 2022-05-16: 900 mg via INTRAVENOUS
  Filled 2022-05-16: qty 20

## 2022-05-16 MED ORDER — SODIUM CHLORIDE 0.9 % IV SOLN: Freq: Once | INTRAVENOUS | Status: AC

## 2022-05-16 NOTE — Progress Notes (Signed)
[facility] [phone]   [phone]  OFFICE PROGRESS NOTE  Alexis Freshwater, NP [place] [address]  DIAGNOSIS: Stage IV (T2a, N0, M1b) non-small cell lung cancer, adenocarcinoma with negative EGFR and ALK mutations diagnosed in January 2015 and presented with right upper lobe lung mass in addition to a solitary brain metastasis.  PRIOR THERAPY: 1) Status post stereotactic radiotherapy to a solitary right parietal brain lesion under the care of Dr. [name] on 10/16/2013. 2) Status post palliative radiotherapy to the right lung tumor under the care of Dr. [name] completed on 12/05/2013. 3) Systemic chemotherapy with carboplatin for AUC of 5 and Alimta 500 mg/M2 every 3 weeks. First dose Jan 06 2014. Status post 6 cycles.  CURRENT THERAPY: Systemic chemotherapy with maintenance Alimta 500 MG/M2 every 3 weeks, status post 136 cycles.  INTERVAL HISTORY: [name] 71 y.o. female returns to the clinic today for follow-up visit accompanied by her husband.  The patient is feeling fine today with no concerning complaints.  She denied having any current chest pain, shortness of breath, cough or hemoptysis.  She denied having any fever or chills.  She has no nausea, vomiting, diarrhea or constipation.  She has no headache or visual changes.  She denied having any weight loss or night sweats.  She continues to tolerate her treatment with maintenance Alimta fairly well.  The patient is here today for evaluation before starting cycle #137 of her treatment.   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  MEDICATIONS:  Current Outpatient Medications  Medication Sig Dispense Refill   acetaminophen (TYLENOL) 500 MG tablet Take 500 mg by mouth every 6 (six) hours as needed for mild pain or headache. Reported on 11/02/2015     ALPRAZolam (XANAX) 1 MG tablet Take 0.5 tablets (0.5 mg total) by mouth 2 (two) times daily as needed for anxiety. 30 tablet 2   Ascorbic Acid (VITAMIN C GUMMIE PO) Take 1 each by mouth every morning.     aspirin EC 81 MG tablet Take 1 tablet (81 mg total) by mouth daily. 150 tablet 2   [employer] ACID CONTROLLER 10 MG tablet SMARTSIG:1 Tablet(s) By Mouth Every 12 Hours PRN     dexamethasone (DECADRON) 4 MG tablet TAKE 1 TABLET TWICE A DAY THE DAY BEFORE, THE DAY OF, AND THE DAY AFTER CHEMOTHERAPY. 40 tablet 2   fluconazole (DIFLUCAN) 150 MG tablet Take 1 tablet po once. May repeat dose in 3 days as needed for persistent symptoms. 3 tablet 1   folic acid (FOLVITE) 1 MG tablet TAKE 1 TABLET BY MOUTH EVERY DAY 90 tablet 0   loratadine (CLARITIN) 10 MG tablet Take 10 mg by mouth daily.     metFORMIN (GLUCOPHAGE) 500 MG tablet TAKE 1 TABLET BY MOUTH TWICE A DAY 180 tablet 1   Multiple Vitamin (MULTIVITAMIN WITH MINERALS) TABS tablet Take 1 tablet by mouth every morning.     omeprazole (PRILOSEC) 20 MG capsule TAKE 1 CAPSULE BY MOUTH EVERY DAY 90 capsule 0  ondansetron (ZOFRAN) 8 MG tablet TAKE 1 TABLET BY MOUTH BEFORE CHEMO 30 tablet 1   OVER THE COUNTER MEDICATION Take 1 tablet by mouth every morning. (Vitamin A)     phenazopyridine (PYRIDIUM) 200 MG tablet Take 1 tablet (200 mg total) by mouth every 8 (eight) hours as needed. 10 tablet 0   prochlorperazine (COMPAZINE) 10 MG tablet Take 1 tablet (10 mg total) by mouth every 6 (six) hours as needed for nausea or vomiting. 60 tablet 0   rosuvastatin (CRESTOR) 10 MG tablet Take 1 tablet (10 mg total) by mouth daily. 30 tablet 12   senna-docusate (SENOKOT-S) 8.6-50 MG tablet Take 1 tablet by mouth  daily. 30 tablet prn   sulfamethoxazole-trimethoprim (BACTRIM DS) 800-160 MG tablet Take 1 tablet by mouth 2 (two) times daily. 14 tablet 0   trimethoprim (TRIMPEX) 100 MG tablet Take 100 mg by mouth at bedtime.     Vitamin D, Ergocalciferol, (DRISDOL) 1.25 MG (50000 UNIT) CAPS capsule Take 50,000 Units by mouth once a week.     No current facility-administered medications for this visit.    SURGICAL HISTORY:  Past Surgical History:  Procedure Laterality Date   ABDOMINAL HYSTERECTOMY     COLOSTOMY TAKEDOWN N/A 07/10/2014   Procedure: LAPAROSCOPIC LYSIS OF ADHESIONS (90 MIN) LAPAROSCOPIC ASSISTED COLOSTOMY CLOSURE, RIGID PROCTOSIGMOIDOSCOPY;  Surgeon: Jackolyn Confer, MD;  Location: [facility];  Service: General;  Laterality: N/A;   LAPAROTOMY N/A 11/03/2013   Procedure: EXPLORATORY LAPAROTOMY, DRAINAGE OF INTRA  ABDOMINAL ABSCESSES, MOBILIZATION OF SPLENIC FLEXURE, SIGMOID COLECTOMY WITH COLOSTOMY;  Surgeon: Odis Hollingshead, MD;  Location: [facility];  Service: General;  Laterality: N/A;   VIDEO BRONCHOSCOPY Bilateral 08/30/2013   Procedure: VIDEO BRONCHOSCOPY WITH FLUORO;  Surgeon: Tanda Rockers, MD;  Location: [facility];  Service: Cardiopulmonary;  Laterality: Bilateral;    REVIEW OF SYSTEMS:  A comprehensive review of systems was negative.   PHYSICAL EXAMINATION: General appearance: alert, cooperative, and no distress Head: Normocephalic, without obvious abnormality, atraumatic Neck: no adenopathy, no JVD, supple, symmetrical, trachea midline, and thyroid not enlarged, symmetric, no tenderness/mass/nodules Lymph nodes: Cervical, supraclavicular, and axillary nodes normal. Resp: clear to auscultation bilaterally Back: symmetric, no curvature. ROM normal. No CVA tenderness. Cardio: regular rate and rhythm, S1, S2 normal, no murmur, click, rub or gallop GI: soft, non-tender; bowel sounds normal; no masses,  no organomegaly Extremities: extremities normal, atraumatic, no cyanosis or edema    ECOG PERFORMANCE STATUS: 1 - Symptomatic but completely ambulatory   Pulse 94, temperature (!) 97.4 F (36.3 C), temperature source Oral, resp. rate 15, weight 159 lb 3.2 oz (72.2 kg), SpO2 99 %.  LABORATORY DATA: Lab Results  Component Value Date   WBC 6.2 05/16/2022   HGB 11.3 (L) 05/16/2022   HCT 35.5 (L) 05/16/2022   MCV 102.9 (H) 05/16/2022   PLT 273 05/16/2022      Chemistry      Component Value Date/Time   NA 137 04/11/2022 0955   NA 137 08/31/2021 0900   NA 139 08/14/2017 0837   K 3.8 04/11/2022 0955   K 4.4 08/14/2017 0837   CL 104 04/11/2022 0955   CO2 25 04/11/2022 0955   CO2 24 08/14/2017 0837   BUN 19 04/11/2022 0955   BUN 24 08/31/2021 0900   BUN 13.3 08/14/2017 0837   CREATININE 0.79 04/11/2022 0955   CREATININE 0.80 11/16/2021 0805   CREATININE 0.8 08/14/2017 0837      Component Value Date/Time   CALCIUM 9.5 04/11/2022 0955  CALCIUM 9.3 08/14/2017 0837   ALKPHOS 68 04/11/2022 0955   ALKPHOS 107 08/14/2017 0837   AST 10 (L) 04/11/2022 0955   AST 11 (L) 11/16/2021 0805   AST 12 08/14/2017 0837   ALT 10 04/11/2022 0955   ALT 12 11/16/2021 0805   ALT 12 08/14/2017 0837   BILITOT 0.4 04/11/2022 0955   BILITOT 0.5 11/16/2021 0805   BILITOT 0.37 08/14/2017 0837       RADIOGRAPHIC STUDIES: No results found.  ASSESSMENT AND PLAN:  This is a very pleasant 71 years old white female with metastatic non-small cell lung cancer, adenocarcinoma status post induction systemic chemotherapy with carboplatin and Alimta with partial response.  The patient has no actionable EGFR or ALK mutations at the time of her diagnosis. The patient is currently on maintenance treatment with single agent Alimta status post 136 cycles. The patient has been tolerating her treatment with maintenance Alimta fairly well. I recommended for her to proceed with cycle #137 today as planned. I will see her back for follow-up visit in 3 weeks for evaluation with repeat CT scan of  the chest, abdomen and pelvis for restaging of her disease. For the anxiety she is currently on Xanax. The patient was advised to call immediately if she has any other concerning symptoms in the interval. The patient voices understanding of current disease status and treatment options and is in agreement with the current care plan. All questions were answered. The patient knows to call the clinic with any problems, questions or concerns. We can certainly see the patient much sooner if necessary.  Disclaimer: This note was dictated with voice recognition software. Similar sounding words can inadvertently be transcribed and may not be corrected upon review.

## 2022-05-16 NOTE — Patient Instructions (Signed)
[facility]  Discharge Instructions: Thank you for choosing [facility] to provide your oncology and hematology care.   If you have a lab appointment with the [facility], please go directly to the [facility] and check in at the registration area.   Wear comfortable clothing and clothing appropriate for easy access to any Portacath or PICC line.   We strive to give you quality time with your provider. You may need to reschedule your appointment if you arrive late (15 or more minutes).  Arriving late affects you and other patients whose appointments are after yours.  Also, if you miss three or more appointments without notifying the office, you may be dismissed from the clinic at the provider's discretion.      For prescription refill requests, have your pharmacy contact our office and allow 72 hours for refills to be completed.    Today you received the following chemotherapy and/or immunotherapy agents: Alimta      To help prevent nausea and vomiting after your treatment, we encourage you to take your nausea medication as directed.  BELOW ARE SYMPTOMS THAT SHOULD BE REPORTED IMMEDIATELY: *FEVER GREATER THAN 100.4 F (38 C) OR HIGHER *CHILLS OR SWEATING *NAUSEA AND VOMITING THAT IS NOT CONTROLLED WITH YOUR NAUSEA MEDICATION *UNUSUAL SHORTNESS OF BREATH *UNUSUAL BRUISING OR BLEEDING *URINARY PROBLEMS (pain or burning when urinating, or frequent urination) *BOWEL PROBLEMS (unusual diarrhea, constipation, pain near the anus) TENDERNESS IN MOUTH AND THROAT WITH OR WITHOUT PRESENCE OF ULCERS (sore throat, sores in mouth, or a toothache) UNUSUAL RASH, SWELLING OR PAIN  UNUSUAL VAGINAL DISCHARGE OR ITCHING   Items with * indicate a potential emergency and should be followed up as soon as possible or go to the Emergency Department if any problems should occur.  Please show the CHEMOTHERAPY ALERT CARD or IMMUNOTHERAPY ALERT CARD at check-in to the  Emergency Department and triage nurse.  Should you have questions after your visit or need to cancel or reschedule your appointment, please contact [facility]  Dept: [phone]  and follow the prompts.  Office hours are 8:00 a.m. to 4:30 p.m. Monday - Friday. Please note that voicemails left after 4:00 p.m. may not be returned until the following business day.  We are closed weekends and major holidays. You have access to a nurse at all times for urgent questions. Please call the main number to the clinic Dept: [phone] and follow the prompts.   For any non-urgent questions, you may also contact your provider using MyChart. We now offer e-Visits for anyone 62 and older to request care online for non-urgent symptoms. For details visit [email or web address].   Also download the MyChart app! Go to the app store, search "MyChart", open the app, select [facility], and log in with your MyChart username and password.  Masks are optional in the cancer centers. If you would like for your care team to wear a mask while they are taking care of you, please let them know. You may have one support person who is at least 70 years old accompany you for your appointments.

## 2022-05-16 NOTE — Addendum Note (Signed)
Addended by: Ardeen Garland on: 05/16/2022 10:17 AM   Modules accepted: Orders

## 2022-05-16 NOTE — Addendum Note (Signed)
Addended by: Ardeen Garland on: 05/16/2022 10:14 AM   Modules accepted: Orders

## 2022-05-19 ENCOUNTER — Other Ambulatory Visit: Payer: Self-pay | Admitting: Physician Assistant

## 2022-05-19 ENCOUNTER — Ambulatory Visit
Admission: RE | Admit: 2022-05-19 | Discharge: 2022-05-19 | Disposition: A | Discharge status: Home / Self Care | Payer: Medicare Other | Source: Ambulatory Visit | Attending: Internal Medicine | Admitting: Internal Medicine

## 2022-05-19 DIAGNOSIS — J432 Centrilobular emphysema: Secondary | ICD-10-CM | POA: Diagnosis not present

## 2022-05-19 DIAGNOSIS — I700 Atherosclerosis of aorta: Secondary | ICD-10-CM | POA: Diagnosis not present

## 2022-05-19 DIAGNOSIS — K429 Umbilical hernia without obstruction or gangrene: Secondary | ICD-10-CM | POA: Diagnosis not present

## 2022-05-19 DIAGNOSIS — C3490 Malignant neoplasm of unspecified part of unspecified bronchus or lung: Secondary | ICD-10-CM

## 2022-05-19 DIAGNOSIS — K7689 Other specified diseases of liver: Secondary | ICD-10-CM | POA: Diagnosis not present

## 2022-05-19 DIAGNOSIS — J929 Pleural plaque without asbestos: Secondary | ICD-10-CM | POA: Diagnosis not present

## 2022-05-19 DIAGNOSIS — K439 Ventral hernia without obstruction or gangrene: Secondary | ICD-10-CM | POA: Diagnosis not present

## 2022-05-19 DIAGNOSIS — C3411 Malignant neoplasm of upper lobe, right bronchus or lung: Secondary | ICD-10-CM

## 2022-05-19 MED ORDER — IOPAMIDOL (ISOVUE-300) INJECTION 61%
100.0000 mL | Freq: Once | INTRAVENOUS | Status: AC | PRN
  Administered 2022-05-19: 100 mL via INTRAVENOUS

## 2022-05-20 ENCOUNTER — Other Ambulatory Visit: Payer: Self-pay | Admitting: Physician Assistant

## 2022-05-20 DIAGNOSIS — C3490 Malignant neoplasm of unspecified part of unspecified bronchus or lung: Secondary | ICD-10-CM

## 2022-05-24 ENCOUNTER — Other Ambulatory Visit: Payer: Medicare Other

## 2022-05-24 ENCOUNTER — Encounter: Payer: Self-pay | Admitting: Nurse Practitioner

## 2022-05-24 ENCOUNTER — Ambulatory Visit: Payer: Medicare Other | Admitting: Physician Assistant

## 2022-05-24 ENCOUNTER — Ambulatory Visit: Payer: Medicare Other

## 2022-05-24 ENCOUNTER — Ambulatory Visit (INDEPENDENT_AMBULATORY_CARE_PROVIDER_SITE_OTHER): Payer: Medicare Other | Admitting: Nurse Practitioner

## 2022-05-24 VITALS — BP 101/63 | HR 63 | Ht 64.5 in | Wt 154.0 lb

## 2022-05-24 DIAGNOSIS — E119 Type 2 diabetes mellitus without complications: Secondary | ICD-10-CM | POA: Diagnosis not present

## 2022-05-24 DIAGNOSIS — N390 Urinary tract infection, site not specified: Secondary | ICD-10-CM

## 2022-05-24 MED ORDER — GLIMEPIRIDE 2 MG PO TABS: 2.0000 mg | ORAL_TABLET | Freq: Every day | ORAL | 2 refills | Status: DC

## 2022-05-24 NOTE — Progress Notes (Signed)
Established patient visit   Patient: Alexis [name]   DOB: Jul 16, 1951   71 y.o. Female  MRN: [id number] Visit Date: 05/24/2022   Chief Complaint  Patient presents with   Medication Problem   Subjective    HPI  Follow up diabetes  -concern that medication may be contributing to frequent urinary tract infecting. She does take Trimpex to prevent frequent UTIs. Has been treated once in past few months for acute UTI.  -most recent HgbA1c 5..9 on 04/20/2022. -states that every time she takes to the metformin, her stomach burns. She gets abdominal pain and diarrhea   Medications: Outpatient Medications Prior to Visit  Medication Sig   acetaminophen (TYLENOL) 500 MG tablet Take 500 mg by mouth every 6 (six) hours as needed for mild pain or headache. Reported on 11/02/2015   ALPRAZolam (XANAX) 1 MG tablet Take 0.5 tablets (0.5 mg total) by mouth 2 (two) times daily as needed for anxiety.   Ascorbic Acid (VITAMIN C GUMMIE PO) Take 1 each by mouth every morning.   aspirin EC 81 MG tablet Take 1 tablet (81 mg total) by mouth daily.   Cranberry 240 MG CAPS Take 1 capsule by mouth daily. This is in her probiotic   [employer] ACID CONTROLLER 10 MG tablet SMARTSIG:1 Tablet(s) By Mouth Every 12 Hours PRN   dexamethasone (DECADRON) 4 MG tablet TAKE 1 TABLET TWICE A DAY THE DAY BEFORE, THE DAY OF, AND THE DAY AFTER CHEMOTHERAPY.   fluconazole (DIFLUCAN) 150 MG tablet Take 1 tablet po once. May repeat dose in 3 days as needed for persistent symptoms.   folic acid (FOLVITE) 1 MG tablet TAKE 1 TABLET BY MOUTH EVERY DAY   loratadine (CLARITIN) 10 MG tablet Take 10 mg by mouth daily.   metFORMIN (GLUCOPHAGE) 500 MG tablet TAKE 1 TABLET BY MOUTH TWICE A DAY   Multiple Vitamin (MULTIVITAMIN WITH MINERALS) TABS tablet Take 1 tablet by mouth every morning.   omeprazole (PRILOSEC) 20 MG capsule TAKE 1 CAPSULE BY MOUTH EVERY DAY   ondansetron (ZOFRAN) 8 MG tablet TAKE 1 TABLET BY MOUTH BEFORE CHEMO   OVER THE COUNTER  MEDICATION Take 1 tablet by mouth every morning. (Vitamin A)   phenazopyridine (PYRIDIUM) 200 MG tablet Take 1 tablet (200 mg total) by mouth every 8 (eight) hours as needed.   prochlorperazine (COMPAZINE) 10 MG tablet Take 1 tablet (10 mg total) by mouth every 6 (six) hours as needed for nausea or vomiting.   rosuvastatin (CRESTOR) 10 MG tablet Take 1 tablet (10 mg total) by mouth daily.   senna-docusate (SENOKOT-S) 8.6-50 MG tablet Take 1 tablet by mouth daily.   Vitamin D, Ergocalciferol, (DRISDOL) 1.25 MG (50000 UNIT) CAPS capsule Take 50,000 Units by mouth once a week. Vit d 3   No facility-administered medications prior to visit.    Review of Systems  Constitutional:  Negative for activity change, appetite change, chills, fatigue and fever.  HENT:  Negative for congestion, postnasal drip, rhinorrhea, sinus pressure, sinus pain, sneezing and sore throat.   Eyes: Negative.   Respiratory:  Negative for cough, chest tightness, shortness of breath and wheezing.   Cardiovascular:  Negative for chest pain and palpitations.  Gastrointestinal:  Positive for abdominal pain and diarrhea. Negative for constipation, nausea and vomiting.       Abdominal tenderness and diarrhea area associated with taking metformin.   Endocrine: Negative for cold intolerance, heat intolerance, polydipsia and polyuria.       Blood sugars doing well  Genitourinary:  Negative for dyspareunia, dysuria, flank pain, frequency and urgency.       Frequent urinary tract infection.   Musculoskeletal:  Negative for arthralgias, back pain and myalgias.  Skin:  Negative for rash.  Allergic/Immunologic: Negative for environmental allergies.  Neurological:  Negative for dizziness, weakness and headaches.  Hematological:  Negative for adenopathy.  Psychiatric/Behavioral:  The patient is nervous/anxious.     Last hemoglobin A1c Lab Results  Component Value Date   HGBA1C 5.9 (A) 04/20/2022       Objective     Today's  Vitals   05/24/22 1354  BP: 101/63  Pulse: 63  SpO2: 98%  Weight: 154 lb (69.9 kg)  Height: 5' 4.5" (1.638 m)   Body mass index is 26.03 kg/m.   BP Readings from Last 3 Encounters:  05/24/22 101/63  05/16/22 (Abnormal) 140/71  04/20/22 101/65    Wt Readings from Last 3 Encounters:  05/24/22 154 lb (69.9 kg)  05/16/22 159 lb 3.2 oz (72.2 kg)  04/20/22 154 lb (69.9 kg)    Physical Exam Vitals and nursing note reviewed.  Constitutional:      Appearance: Normal appearance. She is well-developed.  HENT:     Head: Normocephalic and atraumatic.     Nose: Nose normal.     Mouth/Throat:     Mouth: Mucous membranes are moist.     Pharynx: Oropharynx is clear.  Eyes:     Extraocular Movements: Extraocular movements intact.     Conjunctiva/sclera: Conjunctivae normal.     Pupils: Pupils are equal, round, and reactive to light.  Cardiovascular:     Rate and Rhythm: Normal rate and regular rhythm.     Pulses: Normal pulses.     Heart sounds: Normal heart sounds.  Pulmonary:     Effort: Pulmonary effort is normal.     Breath sounds: Normal breath sounds.  Abdominal:     Palpations: Abdomen is soft.  Musculoskeletal:        General: Normal range of motion.     Cervical back: Normal range of motion and neck supple.  Lymphadenopathy:     Cervical: No cervical adenopathy.  Skin:    General: Skin is warm and dry.     Capillary Refill: Capillary refill takes less than 2 seconds.  Neurological:     General: No focal deficit present.     Mental Status: She is alert and oriented to person, place, and time.  Psychiatric:        Mood and Affect: Mood normal.        Behavior: Behavior normal.        Thought Content: Thought content normal.        Judgment: Judgment normal.       Assessment & Plan    1. Chronic urinary tract infection Per patient, started after she started metformin. She is followed per urology for this. States that she has had 16 UTIs since starting metformin.    2. Diabetes mellitus without complication (HCC) /c metformin. Start amaryl 2 mg daily. Advised her to limit intake of carbohydrates and sugar and continue low impact exercise. Will recheck HgbA1c in two months  - glimepiride (AMARYL) 2 MG tablet; Take 1 tablet (2 mg total) by mouth daily before breakfast.  Dispense: 30 tablet; Refill: 2   Problem List Items Addressed This Visit       Endocrine   Diabetes mellitus without complication ([facility])   Relevant Medications   glimepiride (AMARYL) 2 MG tablet  Genitourinary   Chronic urinary tract infection - Primary     Return in about 2 months (around 07/24/2022) for diabetes with HgbA1c check.         [name], NP  [facility] [phone] (phone) [phone] (fax)  [place]

## 2022-05-25 ENCOUNTER — Other Ambulatory Visit: Payer: Self-pay | Admitting: Medical Oncology

## 2022-05-25 NOTE — Progress Notes (Signed)
Pt stated her provider d/c Metformin due to pt increased in # UTIs. Pt switched to Amaryl.

## 2022-05-28 DIAGNOSIS — N3081 Other cystitis with hematuria: Secondary | ICD-10-CM | POA: Diagnosis not present

## 2022-05-28 DIAGNOSIS — N3090 Cystitis, unspecified without hematuria: Secondary | ICD-10-CM | POA: Diagnosis not present

## 2022-06-01 NOTE — Progress Notes (Signed)
[facility] OFFICE PROGRESS NOTE  [name], [place] [id number]  DIAGNOSIS: Stage IV (T2a, N0, M1b) non-small cell lung cancer, adenocarcinoma with negative EGFR and ALK mutations diagnosed in January 2015 and presented with right upper lobe lung mass in addition to a solitary brain metastasis  PRIOR THERAPY:  1) Status post stereotactic radiotherapy to a solitary right parietal brain lesion under the care of Dr. [name] on 10/16/2013. 2) Status post palliative radiotherapy to the right lung tumor under the care of Dr. [name] completed on 12/05/2013. 3) Systemic chemotherapy with carboplatin for AUC of 5 and Alimta 500 mg/M2 every 3 weeks. First dose Jan 06 2014. Status post 6 cycles.  CURRENT THERAPY: Systemic chemotherapy with maintenance Alimta 500 MG/M2 every 3 weeks, status post 137 cycles.  INTERVAL HISTORY: Alexis [name] 71 y.o. female returns to the clinic today for a follow up visit. The patient is feeling fairly well today with out any concerning complaints. She mentions that her primary care provider discontinued her metformin due to her frequent urinary tract infections. She feels better since discontinuing metformin and has less reflux and abdominal aching.  She follows with Dr. Jeffie Pollock from [facility] u[facility]. She had a UTI a few weeks ago and went to an urgent care. She had some weakness around that time but is feeling better at this time. She is taking a probiotic.   She is currently undergoing single agent chemotherapy with Alimta.  She tolerates this well.  Denies any fever, chills, night sweats, or unexplained weight loss.  Denies any chest pain, cough, or hemoptysis.  She denies any significant or worsening shortness of breath with exertion except with mild shortness of breath with certain activities.  Denies any nausea, vomiting, or constipation denies any headache or visual changes.  Denies any rashes or skin changes.  She  sometimes has loose stool after eating.  She recently had a restaging CT scan performed.  She is here today for evaluation and to review her scan results before starting cycle 138.    MEDICAL HISTORY: Past Medical History:  Diagnosis Date   Anxiety    Anxiety 06/20/2016   Cervical cancer ([place])    Diabetes mellitus without complication ([place])    patient states she has type 2   Encounter for antineoplastic chemotherapy 07/20/2015   Malignant neoplasm of right upper lobe of lung (HCC)     non small cell lung cancer adenocarcioma with brain meta    ALLERGIES:  is allergic to codeine.  MEDICATIONS:  Current Outpatient Medications  Medication Sig Dispense Refill   acetaminophen (TYLENOL) 500 MG tablet Take 500 mg by mouth every 6 (six) hours as needed for mild pain or headache. Reported on 11/02/2015     ALPRAZolam (XANAX) 1 MG tablet Take 0.5 tablets (0.5 mg total) by mouth 2 (two) times daily as needed for anxiety. 30 tablet 2   Ascorbic Acid (VITAMIN C GUMMIE PO) Take 1 each by mouth every morning.     aspirin EC 81 MG tablet Take 1 tablet (81 mg total) by mouth daily. 150 tablet 2   Cranberry 240 MG CAPS Take 1 capsule by mouth daily. This is in her probiotic     [employer] ACID CONTROLLER 10 MG tablet SMARTSIG:1 Tablet(s) By Mouth Every 12 Hours PRN     dexamethasone (DECADRON) 4 MG tablet TAKE 1 TABLET TWICE A DAY THE DAY BEFORE, THE DAY OF, AND THE DAY AFTER CHEMOTHERAPY.  40 tablet 2   fluconazole (DIFLUCAN) 150 MG tablet Take 1 tablet po once. May repeat dose in 3 days as needed for persistent symptoms. 3 tablet 1   folic acid (FOLVITE) 1 MG tablet TAKE 1 TABLET BY MOUTH EVERY DAY 90 tablet 0   glimepiride (AMARYL) 2 MG tablet Take 1 tablet (2 mg total) by mouth daily before breakfast. 30 tablet 2   loratadine (CLARITIN) 10 MG tablet Take 10 mg by mouth daily.     Multiple Vitamin (MULTIVITAMIN WITH MINERALS) TABS tablet Take 1 tablet by mouth every morning.     omeprazole (PRILOSEC) 20 MG  capsule TAKE 1 CAPSULE BY MOUTH EVERY DAY 90 capsule 0   ondansetron (ZOFRAN) 8 MG tablet TAKE 1 TABLET BY MOUTH BEFORE CHEMO 30 tablet 1   OVER THE COUNTER MEDICATION Take 1 tablet by mouth every morning. (Vitamin A)     phenazopyridine (PYRIDIUM) 200 MG tablet Take 1 tablet (200 mg total) by mouth every 8 (eight) hours as needed. 10 tablet 0   prochlorperazine (COMPAZINE) 10 MG tablet Take 1 tablet (10 mg total) by mouth every 6 (six) hours as needed for nausea or vomiting. 60 tablet 0   rosuvastatin (CRESTOR) 10 MG tablet Take 1 tablet (10 mg total) by mouth daily. 30 tablet 12   senna-docusate (SENOKOT-S) 8.6-50 MG tablet Take 1 tablet by mouth daily. 30 tablet prn   Vitamin D, Ergocalciferol, (DRISDOL) 1.25 MG (50000 UNIT) CAPS capsule Take 50,000 Units by mouth once a week. Vit d 3     No current facility-administered medications for this visit.    SURGICAL HISTORY:  Past Surgical History:  Procedure Laterality Date   ABDOMINAL HYSTERECTOMY     COLOSTOMY TAKEDOWN N/A 07/10/2014   Procedure: LAPAROSCOPIC LYSIS OF ADHESIONS (90 MIN) LAPAROSCOPIC ASSISTED COLOSTOMY CLOSURE, RIGID PROCTOSIGMOIDOSCOPY;  Surgeon: Jackolyn Confer, MD;  Location: [facility];  Service: General;  Laterality: N/A;   LAPAROTOMY N/A 11/03/2013   Procedure: EXPLORATORY LAPAROTOMY, DRAINAGE OF INTRA  ABDOMINAL ABSCESSES, MOBILIZATION OF SPLENIC FLEXURE, SIGMOID COLECTOMY WITH COLOSTOMY;  Surgeon: Odis Hollingshead, MD;  Location: [facility];  Service: General;  Laterality: N/A;   VIDEO BRONCHOSCOPY Bilateral 08/30/2013   Procedure: VIDEO BRONCHOSCOPY WITH FLUORO;  Surgeon: Tanda Rockers, MD;  Location: [facility];  Service: Cardiopulmonary;  Laterality: Bilateral;    REVIEW OF SYSTEMS:   Constitutional: Negative for appetite change, chills, fatigue, fever and unexpected weight change.  HENT: Negative for mouth sores, nosebleeds, sore throat and trouble swallowing.   Eyes: Negative for eye problems and icterus.   Respiratory: Negative for cough, hemoptysis, shortness of breath and wheezing.   Cardiovascular: Negative for chest pain and leg swelling.  Gastrointestinal: Negative for abdominal pain, diarrhea, constipation, nausea and vomiting.  Genitourinary: Negative for bladder incontinence, difficulty urinating, dysuria, frequency and hematuria.   Musculoskeletal: Negative for back pain, gait problem, neck pain and neck stiffness.  Skin: Negative for itching and rash.  Neurological: Negative for dizziness, extremity weakness, gait problem, headaches, light-headedness and seizures.  Hematological: Negative for adenopathy. Does not bruise/bleed easily.  Psychiatric/Behavioral: Negative for confusion, depression and sleep disturbance. The patient is not nervous/anxious.   PHYSICAL EXAMINATION:  Blood pressure (!) 141/65, pulse (!) 107, temperature 98.2 F (36.8 C), temperature source Oral, resp. rate 17, height 5' 4.5" (1.638 m), weight 157 lb 3.2 oz (71.3 kg), SpO2 100 %.  ECOG PERFORMANCE STATUS: 1  Physical Exam  Constitutional: Oriented to person, place, and time and well-developed, well-nourished, and in  no distress.  HENT:  Head: Normocephalic and atraumatic.  Mouth/Throat: Oropharynx is clear and moist. No oropharyngeal exudate.  Eyes: Conjunctivae are normal. Right eye exhibits no discharge. Left eye exhibits no discharge. No scleral icterus.  Neck: Normal range of motion. Neck supple.  Cardiovascular: Normal rate, regular rhythm, normal heart sounds and intact distal pulses.   Pulmonary/Chest: Effort normal and breath sounds normal. No respiratory distress. No wheezes. No rales.  Abdominal: Soft. Bowel sounds are normal. Exhibits no distension and no mass. There is no tenderness.  Musculoskeletal: Normal range of motion. Exhibits no edema.  Lymphadenopathy:    No cervical adenopathy.  Neurological: Alert and oriented to person, place, and time. Exhibits normal muscle tone. Gait normal.  Coordination normal.  Skin: Skin is warm and dry. No rash noted. Not diaphoretic. No erythema. No pallor.  Psychiatric: Mood, memory and judgment normal.  Vitals reviewed.  LABORATORY DATA: Lab Results  Component Value Date   WBC 11.2 (H) 06/06/2022   HGB 11.4 (L) 06/06/2022   HCT 36.1 06/06/2022   MCV 102.8 (H) 06/06/2022   PLT 474 (H) 06/06/2022      Chemistry      Component Value Date/Time   NA 138 06/06/2022 1039   NA 137 08/31/2021 0900   NA 139 08/14/2017 0837   K 4.9 06/06/2022 1039   K 4.4 08/14/2017 0837   CL 104 06/06/2022 1039   CO2 26 06/06/2022 1039   CO2 24 08/14/2017 0837   BUN 23 06/06/2022 1039   BUN 24 08/31/2021 0900   BUN 13.3 08/14/2017 0837   CREATININE 0.96 06/06/2022 1039   CREATININE 0.8 08/14/2017 0837      Component Value Date/Time   CALCIUM 9.8 06/06/2022 1039   CALCIUM 9.3 08/14/2017 0837   ALKPHOS 77 06/06/2022 1039   ALKPHOS 107 08/14/2017 0837   AST 14 (L) 06/06/2022 1039   AST 12 08/14/2017 0837   ALT 11 06/06/2022 1039   ALT 12 08/14/2017 0837   BILITOT 0.3 06/06/2022 1039   BILITOT 0.37 08/14/2017 0837       RADIOGRAPHIC STUDIES:  CT CHEST ABDOMEN PELVIS W CONTRAST  Result Date: 05/20/2022 CLINICAL DATA:  Non-small cell lung cancer restaging. History of prior chemotherapy and radiation therapy. * Tracking Code: BO * EXAM: CT CHEST, ABDOMEN, AND PELVIS WITH CONTRAST TECHNIQUE: Multidetector CT imaging of the chest, abdomen and pelvis was performed following the standard protocol during bolus administration of intravenous contrast. RADIATION DOSE REDUCTION: This exam was performed according to the departmental dose-optimization program which includes automated exposure control, adjustment of the mA and/or kV according to patient size and/or use of iterative reconstruction technique. CONTRAST:  171m ISOVUE-300 IOPAMIDOL (ISOVUE-300) INJECTION 61% COMPARISON:  03/10/2022 FINDINGS: CT CHEST FINDINGS Cardiovascular: Atherosclerotic  thoracic aorta and branch vessels. Mediastinum/Nodes: No significant adenopathy. Lungs/Pleura: Stable appearance of medial right apical consolidation in volume loss with scattered calcifications, likely therapy related. Stable left apical pleuroparenchymal scarring. Airway thickening in the right upper lobe, unchanged. Centrilobular emphysema. Stable scarring anteriorly in the left lower lobe. 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 CT ABDOMEN PELVIS FINDINGS Hepatobiliary: Small calcification posteriorly in the dome of the right hepatic lobe. This is most likely postinflammatory. Gallbladder unremarkable. Pancreas: Unremarkable Spleen: Unremarkable Adrenals/Urinary Tract: Unremarkable Stomach/Bowel: Postoperative findings in the sigmoid colon. Vascular/Lymphatic: Atherosclerosis is present, including aortoiliac atherosclerotic disease. Narrowed proximal celiac trunk possibly partially from the median arcuate  ligament. Reproductive: Uterus absent.  Adnexa unremarkable. Other: No supplemental non-categorized findings. Musculoskeletal: Periumbilical hernias contain adipose tissue. Mild levoconvex lumbar scoliosis with rotary component. IMPRESSION: 1. Stable post therapy related findings at the right lung apex and adjacent ribs. No findings of active or progressive malignancy. 2. Other imaging findings of potential clinical significance: Aortic Atherosclerosis (ICD10-I70.0) and Emphysema (ICD10-J43.9). Ventral hernias contain adipose tissue. Electronically Signed   By: Van Clines M.D.   On: 05/20/2022 12:20     ASSESSMENT/PLAN:  This is a very pleasant 71 year old Caucasian female with stage IV non-small cell lung cancer, adenocarcinoma who presented with a right upper lobe lung mass in addition to a solitary brain metastatsis.  She was diagnosed in January 2015.    The patient had  completed induction systemic chemotherapy with carboplatin and Alimta with a partial response.   The patient is currently being treated with single  agent maintenance Alimta.  She is status post 137 cycles.   The patient was seen with Dr. Julien Nordmann today.  The patient recently had a restaging CT scan performed.  Dr. Julien Nordmann personally independently reviewed the scan results and discussed results with the patient today.  The scan showed no evidence of disease progression.  Recommend the patient continue on the same treatment at the same dose. Labs were reviewed. Recommend that she  proceed with #138 today scheduled.   She will continue to follow with [facility] for her frequent UTIs.   The patient was advised to call immediately if she has any concerning symptoms in the interval. The patient voices understanding of current disease status and treatment options and is in agreement with the current care plan. All questions were answered. The patient knows to call the clinic with any problems, questions or concerns. We can certainly see the patient much sooner if necessary  No orders of the defined types were placed in this encounter.     Izzac Rockett L Brynnlie Unterreiner, PA-C 06/06/22  ADDENDUM: Hematology/Oncology Attending: I had a face-to-face encounter with the patient today.  I reviewed her records, lab, scan and recommended her care plan.  This is a very pleasant 71 years old white female with stage IV non-small cell lung cancer diagnosed in January 2015 status post initial systemic chemotherapy with carboplatin and Alimta for 6 cycles followed by maintenance treatment with single agent Alimta for additional 131 cycles.  The patient has been tolerating this treatment fairly well with no concerning complaints except for occasional fatigue. She had repeat CT scan of the chest, abdomen and pelvis performed recently.  I personally and independently reviewed the scan with the patient and her  husband. Her scan showed no concerning findings for disease progression. I recommended for her to continue her maintenance treatment with Alimta and she will proceed with cycle #138 today. For the urinary tract infection she is followed by [facility] as well as her primary care physician. She will come back for follow-up visit in 3 weeks for evaluation before the next cycle of her treatment. The patient was advised to call immediately if she has any other concerning symptoms in the interval. The total time spent in the appointment was 30 minutes. Disclaimer: This note was dictated with voice recognition software. Similar sounding words can inadvertently be transcribed and may be missed upon review. Eilleen Kempf, MD

## 2022-06-03 MED FILL — Dexamethasone Sodium Phosphate Inj 100 MG/10ML: INTRAMUSCULAR | Qty: 1 | Status: AC

## 2022-06-06 ENCOUNTER — Inpatient Hospital Stay (HOSPITAL_BASED_OUTPATIENT_CLINIC_OR_DEPARTMENT_OTHER): Payer: Medicare Other | Admitting: Physician Assistant

## 2022-06-06 ENCOUNTER — Ambulatory Visit: Payer: Medicare Other | Admitting: Internal Medicine

## 2022-06-06 ENCOUNTER — Inpatient Hospital Stay: Payer: Medicare Other

## 2022-06-06 ENCOUNTER — Other Ambulatory Visit: Payer: Self-pay

## 2022-06-06 ENCOUNTER — Inpatient Hospital Stay: Payer: Medicare Other | Attending: Internal Medicine

## 2022-06-06 VITALS — BP 141/65 | HR 107 | Temp 98.2°F | Resp 17 | Ht 64.5 in | Wt 157.2 lb

## 2022-06-06 DIAGNOSIS — Z923 Personal history of irradiation: Secondary | ICD-10-CM | POA: Insufficient documentation

## 2022-06-06 DIAGNOSIS — Z5111 Encounter for antineoplastic chemotherapy: Secondary | ICD-10-CM

## 2022-06-06 DIAGNOSIS — Z79899 Other long term (current) drug therapy: Secondary | ICD-10-CM | POA: Insufficient documentation

## 2022-06-06 DIAGNOSIS — Z23 Encounter for immunization: Secondary | ICD-10-CM | POA: Insufficient documentation

## 2022-06-06 DIAGNOSIS — C7931 Secondary malignant neoplasm of brain: Secondary | ICD-10-CM | POA: Diagnosis not present

## 2022-06-06 DIAGNOSIS — F419 Anxiety disorder, unspecified: Secondary | ICD-10-CM | POA: Diagnosis not present

## 2022-06-06 DIAGNOSIS — C3411 Malignant neoplasm of upper lobe, right bronchus or lung: Secondary | ICD-10-CM

## 2022-06-06 DIAGNOSIS — Z5112 Encounter for antineoplastic immunotherapy: Secondary | ICD-10-CM | POA: Diagnosis not present

## 2022-06-06 LAB — CBC WITH DIFFERENTIAL (CANCER CENTER ONLY)
Abs Immature Granulocytes: 0.08 10*3/uL — ABNORMAL HIGH (ref 0.00–0.07)
Basophils Absolute: 0 10*3/uL (ref 0.0–0.1)
Basophils Relative: 0 %
Eosinophils Absolute: 0 10*3/uL (ref 0.0–0.5)
Eosinophils Relative: 0 %
HCT: 36.1 % (ref 36.0–46.0)
Hemoglobin: 11.4 g/dL — ABNORMAL LOW (ref 12.0–15.0)
Immature Granulocytes: 1 %
Lymphocytes Relative: 5 %
Lymphs Abs: 0.5 10*3/uL — ABNORMAL LOW (ref 0.7–4.0)
MCH: 32.5 pg (ref 26.0–34.0)
MCHC: 31.6 g/dL (ref 30.0–36.0)
MCV: 102.8 fL — ABNORMAL HIGH (ref 80.0–100.0)
Monocytes Absolute: 0.5 10*3/uL (ref 0.1–1.0)
Monocytes Relative: 4 %
Neutro Abs: 10.2 10*3/uL — ABNORMAL HIGH (ref 1.7–7.7)
Neutrophils Relative %: 90 %
Platelet Count: 474 10*3/uL — ABNORMAL HIGH (ref 150–400)
RBC: 3.51 MIL/uL — ABNORMAL LOW (ref 3.87–5.11)
RDW: 15 % (ref 11.5–15.5)
WBC Count: 11.2 10*3/uL — ABNORMAL HIGH (ref 4.0–10.5)
nRBC: 0 % (ref 0.0–0.2)

## 2022-06-06 LAB — CMP (CANCER CENTER ONLY)
ALT: 11 U/L (ref 0–44)
AST: 14 U/L — ABNORMAL LOW (ref 15–41)
Albumin: 4.2 g/dL (ref 3.5–5.0)
Alkaline Phosphatase: 77 U/L (ref 38–126)
Anion gap: 8 (ref 5–15)
BUN: 23 mg/dL (ref 8–23)
CO2: 26 mmol/L (ref 22–32)
Calcium: 9.8 mg/dL (ref 8.9–10.3)
Chloride: 104 mmol/L (ref 98–111)
Creatinine: 0.96 mg/dL (ref 0.44–1.00)
GFR, Estimated: >60 mL/min (ref >60)
Glucose, Bld: 79 mg/dL (ref 70–99)
Potassium: 4.9 mmol/L (ref 3.5–5.1)
Sodium: 138 mmol/L (ref 135–145)
Total Bilirubin: 0.3 mg/dL (ref 0.3–1.2)
Total Protein: 7.5 g/dL (ref 6.5–8.1)

## 2022-06-06 MED ORDER — SODIUM CHLORIDE 0.9 % IV SOLN
10.0000 mg | Freq: Once | INTRAVENOUS | Status: AC
  Administered 2022-06-06: 10 mg via INTRAVENOUS
  Filled 2022-06-06: qty 10

## 2022-06-06 MED ORDER — SODIUM CHLORIDE 0.9 % IV SOLN
500.0000 mg/m2 | Freq: Once | INTRAVENOUS | Status: AC
  Administered 2022-06-06: 900 mg via INTRAVENOUS
  Filled 2022-06-06: qty 20

## 2022-06-06 MED ORDER — SODIUM CHLORIDE 0.9 % IV SOLN: Freq: Once | INTRAVENOUS | Status: AC

## 2022-06-06 NOTE — Patient Instructions (Signed)
[facility]  Discharge Instructions: Thank you for choosing [facility] to provide your oncology and hematology care.   If you have a lab appointment with the [facility], please go directly to the [facility] and check in at the registration area.   Wear comfortable clothing and clothing appropriate for easy access to any Portacath or PICC line.   We strive to give you quality time with your provider. You may need to reschedule your appointment if you arrive late (15 or more minutes).  Arriving late affects you and other patients whose appointments are after yours.  Also, if you miss three or more appointments without notifying the office, you may be dismissed from the clinic at the provider's discretion.      For prescription refill requests, have your pharmacy contact our office and allow 72 hours for refills to be completed.    Today you received the following chemotherapy and/or immunotherapy agents: Alimta      To help prevent nausea and vomiting after your treatment, we encourage you to take your nausea medication as directed.  BELOW ARE SYMPTOMS THAT SHOULD BE REPORTED IMMEDIATELY: *FEVER GREATER THAN 100.4 F (38 C) OR HIGHER *CHILLS OR SWEATING *NAUSEA AND VOMITING THAT IS NOT CONTROLLED WITH YOUR NAUSEA MEDICATION *UNUSUAL SHORTNESS OF BREATH *UNUSUAL BRUISING OR BLEEDING *URINARY PROBLEMS (pain or burning when urinating, or frequent urination) *BOWEL PROBLEMS (unusual diarrhea, constipation, pain near the anus) TENDERNESS IN MOUTH AND THROAT WITH OR WITHOUT PRESENCE OF ULCERS (sore throat, sores in mouth, or a toothache) UNUSUAL RASH, SWELLING OR PAIN  UNUSUAL VAGINAL DISCHARGE OR ITCHING   Items with * indicate a potential emergency and should be followed up as soon as possible or go to the Emergency Department if any problems should occur.  Please show the CHEMOTHERAPY ALERT CARD or IMMUNOTHERAPY ALERT CARD at check-in to the  Emergency Department and triage nurse.  Should you have questions after your visit or need to cancel or reschedule your appointment, please contact [facility]  Dept: [phone]  and follow the prompts.  Office hours are 8:00 a.m. to 4:30 p.m. Monday - Friday. Please note that voicemails left after 4:00 p.m. may not be returned until the following business day.  We are closed weekends and major holidays. You have access to a nurse at all times for urgent questions. Please call the main number to the clinic Dept: [phone] and follow the prompts.   For any non-urgent questions, you may also contact your provider using MyChart. We now offer e-Visits for anyone 43 and older to request care online for non-urgent symptoms. For details visit [email or web address].   Also download the MyChart app! Go to the app store, search "MyChart", open the app, select [facility], and log in with your MyChart username and password.  Masks are optional in the cancer centers. If you would like for your care team to wear a mask while they are taking care of you, please let them know. You may have one support Alexis [name] who is at least 70 years old accompany you for your appointments.

## 2022-06-07 ENCOUNTER — Telehealth: Payer: Self-pay | Admitting: Internal Medicine

## 2022-06-07 NOTE — Telephone Encounter (Signed)
Scheduled per 10/09 los, patient has been called and notified of upcoming  October, November and December appointments.

## 2022-06-08 ENCOUNTER — Other Ambulatory Visit: Payer: Self-pay

## 2022-06-09 ENCOUNTER — Other Ambulatory Visit: Payer: Self-pay

## 2022-06-12 ENCOUNTER — Emergency Department (HOSPITAL_COMMUNITY)
Admission: EM | Admit: 2022-06-12 | Discharge: 2022-06-12 | Disposition: A | Discharge status: Home / Self Care | Payer: Medicare Other | Attending: Emergency Medicine | Admitting: Emergency Medicine

## 2022-06-12 ENCOUNTER — Other Ambulatory Visit: Payer: Self-pay

## 2022-06-12 ENCOUNTER — Encounter (HOSPITAL_COMMUNITY): Payer: Self-pay | Admitting: Emergency Medicine

## 2022-06-12 DIAGNOSIS — E1165 Type 2 diabetes mellitus with hyperglycemia: Secondary | ICD-10-CM | POA: Diagnosis not present

## 2022-06-12 DIAGNOSIS — Z85118 Personal history of other malignant neoplasm of bronchus and lung: Secondary | ICD-10-CM | POA: Insufficient documentation

## 2022-06-12 DIAGNOSIS — R739 Hyperglycemia, unspecified: Secondary | ICD-10-CM

## 2022-06-12 DIAGNOSIS — Z7984 Long term (current) use of oral hypoglycemic drugs: Secondary | ICD-10-CM | POA: Insufficient documentation

## 2022-06-12 DIAGNOSIS — Z7982 Long term (current) use of aspirin: Secondary | ICD-10-CM | POA: Insufficient documentation

## 2022-06-12 LAB — URINALYSIS, ROUTINE W REFLEX MICROSCOPIC
Bacteria, UA: NONE SEEN
Bilirubin Urine: NEGATIVE
Glucose, UA: 50 mg/dL — AB
Ketones, ur: NEGATIVE mg/dL
Nitrite: NEGATIVE
Protein, ur: NEGATIVE mg/dL
Specific Gravity, Urine: 1.016 (ref 1.005–1.030)
pH: 5 (ref 5.0–8.0)

## 2022-06-12 LAB — CBC
HCT: 37.2 % (ref 36.0–46.0)
Hemoglobin: 11.7 g/dL — ABNORMAL LOW (ref 12.0–15.0)
MCH: 32.4 pg (ref 26.0–34.0)
MCHC: 31.5 g/dL (ref 30.0–36.0)
MCV: 103 fL — ABNORMAL HIGH (ref 80.0–100.0)
Platelets: 451 10*3/uL — ABNORMAL HIGH (ref 150–400)
RBC: 3.61 MIL/uL — ABNORMAL LOW (ref 3.87–5.11)
RDW: 14.1 % (ref 11.5–15.5)
WBC: 6 10*3/uL (ref 4.0–10.5)
nRBC: 0 % (ref 0.0–0.2)

## 2022-06-12 LAB — BASIC METABOLIC PANEL
Anion gap: 5 (ref 5–15)
BUN: 26 mg/dL — ABNORMAL HIGH (ref 8–23)
CO2: 27 mmol/L (ref 22–32)
Calcium: 9.2 mg/dL (ref 8.9–10.3)
Chloride: 105 mmol/L (ref 98–111)
Creatinine, Ser: 0.81 mg/dL (ref 0.44–1.00)
GFR, Estimated: >60 mL/min (ref >60)
Glucose, Bld: 166 mg/dL — ABNORMAL HIGH (ref 70–99)
Potassium: 4.2 mmol/L (ref 3.5–5.1)
Sodium: 137 mmol/L (ref 135–145)

## 2022-06-12 LAB — CBG MONITORING, ED
Glucose-Capillary: 188 mg/dL — ABNORMAL HIGH (ref 70–99)
Glucose-Capillary: 111 mg/dL — ABNORMAL HIGH (ref 70–99)

## 2022-06-12 NOTE — Discharge Instructions (Signed)
You were seen in the ER for evaluation of your elevated blood sugar. Your blood sugar is back to normal limits now. I recommend not taking the medication you have for your diabetes so you won't have a lower blood sugar. Please follow up with your primary care provider for a new diabetes medication. You can check your blood sugar if you are feeling unwell or different. If you have any concerns, new or worsening symptoms, please return to the ER for evaluation.   Contact a doctor if: You have trouble keeping your blood sugar in your target range. You have low blood sugar often. Get help right away if: You still have symptoms after you eat or drink something that contains 15 grams of fast-acting carb, and you cannot get your blood sugar above 70 mg/dL by following the 15:15 rule. Your blood sugar is below 54 mg/dL (3 mmol/L). You have a seizure. You faint. These symptoms may be an emergency. Get help right away. Call your local emergency services (911 in the [place] Do not wait to see if the symptoms will go away. Do not drive yourself to the hospital.

## 2022-06-12 NOTE — ED Provider Notes (Signed)
[name] DEPT Provider Note   CSN: [id number] Arrival date & time: 06/12/22  1418     History Chief Complaint  Patient presents with   Hyperglycemia    Alexis [name] is a 71 y.o. female with history of diabetes, lung cancer, hyperlipidemia presents to the emergency department for evaluation of elevated blood sugar today.  Patient reports that she has been on a new diabetes medication since the end of September.  She reports that she was previously on metformin to manage her diabetes however she was getting multiple UTIs while on this medication.  She was stopped and started on glimepiride 2 mg.  She reports on Friday, 2 days ago, she had an episode of hypoglycemia where she felt tingly and sweaty and lightheaded.  She reports that EMS came out and said her blood sugar was 59.  Patient reports that her blood sugars usually between 130 and 150.  EMS came and gave her "sugar sticks" and she had a peanut butter and jelly sandwich and her sugar got to the level where "they felt comfortable".  On Saturday, they bought a glucometer to check her sugar.  Patient did not take her medication given that she thought that this was a side effect.  Last dose of this medication was on Friday.  Today, she ate a bowl of special K, 2 bananas, and an apple and her husband checked her blood sugar within 30 minutes of eating reports that it was at 277.  He was concerned and wanted to bring her into the emergency department for evaluation.  Patient has no symptoms at this time reports she feels okay.   Hyperglycemia Associated symptoms: no abdominal pain, no chest pain, no diaphoresis, no dysuria, no fever, no nausea, no shortness of breath and no vomiting        Home Medications Prior to Admission medications   Medication Sig Start Date End Date Taking? Authorizing Provider  acetaminophen (TYLENOL) 500 MG tablet Take 500 mg by mouth every 6 (six) hours as needed for mild pain or  headache. Reported on 11/02/2015    [provider]  ALPRAZolam Duanne Moron) 1 MG tablet Take 0.5 tablets (0.5 mg total) by mouth 2 (two) times daily as needed for anxiety. 04/20/22   [name], NP  Ascorbic Acid (VITAMIN C GUMMIE PO) Take 1 each by mouth every morning.    [provider]  aspirin EC 81 MG tablet Take 1 tablet (81 mg total) by mouth daily. 08/01/19   Elam Dutch, MD  Cranberry 240 MG CAPS Take 1 capsule by mouth daily. This is in her probiotic    [provider]  [employer] ACID CONTROLLER 10 MG tablet SMARTSIG:1 Tablet(s) By Mouth Every 12 Hours PRN 06/25/19   [provider]  dexamethasone (DECADRON) 4 MG tablet TAKE 1 TABLET TWICE A DAY THE DAY BEFORE, THE DAY OF, AND THE DAY AFTER CHEMOTHERAPY. 01/31/22   Heilingoetter, Cassandra L, PA-C  fluconazole (DIFLUCAN) 150 MG tablet Take 1 tablet po once. May repeat dose in 3 days as needed for persistent symptoms. 04/20/22   [name], NP  folic acid (FOLVITE) 1 MG tablet TAKE 1 TABLET BY MOUTH EVERY DAY 05/20/22   Heilingoetter, Cassandra L, PA-C  glimepiride (AMARYL) 2 MG tablet Take 1 tablet (2 mg total) by mouth daily before breakfast. 05/24/22   [name], NP  loratadine (CLARITIN) 10 MG tablet Take 10 mg by mouth daily. 10/29/20   [provider]  Multiple Vitamin (MULTIVITAMIN WITH MINERALS) TABS tablet Take 1 tablet by mouth every morning.    [provider]  omeprazole (PRILOSEC) 20 MG capsule TAKE 1 CAPSULE BY MOUTH EVERY DAY 05/19/22   Heilingoetter, Cassandra L, PA-C  ondansetron (ZOFRAN) 8 MG tablet TAKE 1 TABLET BY MOUTH BEFORE CHEMO 05/03/22   Curt Bears, MD  OVER THE COUNTER MEDICATION Take 1 tablet by mouth every morning. (Vitamin A)    [provider]  phenazopyridine (PYRIDIUM) 200 MG tablet Take 1 tablet (200 mg total) by mouth every 8 (eight) hours as needed. 04/20/22   [name], NP  prochlorperazine (COMPAZINE) 10 MG tablet Take 1  tablet (10 mg total) by mouth every 6 (six) hours as needed for nausea or vomiting. 11/23/15   Curt Bears, MD  rosuvastatin (CRESTOR) 10 MG tablet Take 1 tablet (10 mg total) by mouth daily. 08/01/19   Elam Dutch, MD  senna-docusate (SENOKOT-S) 8.6-50 MG tablet Take 1 tablet by mouth daily. 06/11/15   Curt Bears, MD  Vitamin D, Ergocalciferol, (DRISDOL) 1.25 MG (50000 UNIT) CAPS capsule Take 50,000 Units by mouth once a week. Vit d 3 09/23/19   [provider]      Allergies    Codeine    Review of Systems   Review of Systems  Constitutional:  Negative for chills, diaphoresis and fever.  Respiratory:  Negative for shortness of breath.   Cardiovascular:  Negative for chest pain.  Gastrointestinal:  Negative for abdominal pain, diarrhea, nausea and vomiting.  Genitourinary:  Negative for dysuria and hematuria.  Neurological:  Negative for syncope and light-headedness.    Physical Exam Updated Vital Signs BP 115/79   Pulse 76   Temp 98.7 F (37.1 C) (Oral)   Resp 18   Ht 5' 4.5" (1.638 m)   Wt 69.9 kg   SpO2 100%   BMI 26.03 kg/m  Physical Exam Vitals and nursing note reviewed.  Constitutional:      General: She is not in acute distress.    Appearance: Normal appearance. She is not ill-appearing, toxic-appearing or diaphoretic.     Comments: Patient well-appearing in no acute distress.  Eyes:     General: No scleral icterus. Pulmonary:     Effort: Pulmonary effort is normal. No respiratory distress.  Skin:    General: Skin is dry.     Findings: No rash.  Neurological:     General: No focal deficit present.     Mental Status: She is alert. Mental status is at baseline.  Psychiatric:        Mood and Affect: Mood normal.     ED Results / Procedures / Treatments   Labs (all labs ordered are listed, but only abnormal results are displayed) Labs Reviewed  BASIC METABOLIC PANEL - Abnormal; Notable for the following components:      Result Value    Glucose, Bld 166 (*)    BUN 26 (*)    All other components within normal limits  CBC - Abnormal; Notable for the following components:   RBC 3.61 (*)    Hemoglobin 11.7 (*)    MCV 103.0 (*)    Platelets 451 (*)    All other components within normal limits  URINALYSIS, ROUTINE W REFLEX MICROSCOPIC - Abnormal; Notable for the following components:   Glucose, UA 50 (*)    Hgb urine dipstick MODERATE (*)    Leukocytes,Ua SMALL (*)    All other components within normal  limits  CBG MONITORING, ED - Abnormal; Notable for the following components:   Glucose-Capillary 188 (*)    All other components within normal limits  CBG MONITORING, ED  CBG MONITORING, ED    EKG None  Radiology No results found.  Procedures Procedures   Medications Ordered in ED Medications - No data to display  ED Course/ Medical Decision Making/ A&P                           Medical Decision Making Amount and/or Complexity of Data Reviewed Labs: ordered.   71 year old female presents to the emergency room for evaluation of a singular reading of hyperglycemia.  Differential diagnosis includes was not limited to hyperglycemia, DKA, HHS, medication management.  Vital signs are unremarkable.  Patient normotensive, afebrile, normal pulse rate, satting well on room air without increased work of breathing.  Physical exam as noted above.  Labs ordered in triage.  Urinalysis shows 50 glucose with moderate amount of hemoglobin and small leukocytes.  There is 6-10 white blood cells but no bacteria seen.  Patient does not have any symptoms of UTI currently.  No ketones noted.  CBC shows mild anemia with a hemoglobin of 11.7 which appears to be consistent with her previous.  Thrombocytosis seen at 451 which appears to be consistent with patient's labs most recently.  BMP shows slightly elevated glucose at 166 with a BUN of 26, normal anion gap.  Normal electrolytes.  Patient CBG was at 188 on arrival and is now 111.   She reports that she is hungry and is ready to eat.  Patient reports that she mainly came into the emergency department because her husband was worried that her sugar was now high when it was low just a few days ago.  She reports that she feels fine and is ready to go home.  I had a discussion with the patient and husband at bedside about blood sugar and blood sugar readings.  Likely her blood sugar was that high given that she has not had any antiglycemic's and also it was just after eating a more carb rich meal. I advised the patient not to take anymore for glimepiride given that she was becoming too low on it.  She reports that she will follow-up with her primary care doctor tomorrow at 8:00 to be placed on a new medication.  I recommended checking the blood sugar at night or if she feels off or symptomatic.  We discussed using complex carbs in the meantime.  We discussed return precautions and red flag symptoms.  Patient verbalized her understanding and agrees to the plan.  Patient is stable being discharged home in good condition.  I discussed this case with my attending physician who cosigned this note including patient's presenting symptoms, physical exam, and planned diagnostics and interventions. Attending physician stated agreement with plan or made changes to plan which were implemented.   Final Clinical Impression(s) / ED Diagnoses Final diagnoses:  Hyperglycemia    Rx / DC Orders ED Discharge Orders     None         Sherrell Puller, PA-C 06/12/22 1812    Godfrey Pick, MD 06/15/22 [phone]

## 2022-06-12 NOTE — ED Triage Notes (Signed)
Pt via POV from home c/o hyperglycemia with CBG 277 at home. She has DM2 meds with her but refuses to take them after some adverse side effects and hypoglycemia yesterday. Pt says she feels a little bit weak but otherwise has no complaints. She just got a new glucometer yesterday.

## 2022-06-13 ENCOUNTER — Telehealth: Payer: Self-pay

## 2022-06-13 NOTE — Progress Notes (Deleted)
Established patient visit   Patient: Alexis [name]   DOB: 1951/07/25   71 y.o. Female  MRN: [id number] Visit Date: 06/14/2022  No chief complaint on file.  Subjective    HPI  Blood sugar concerns. -discontinued metformin. Started amaryl 2 mg daily -  Medications: Outpatient Medications Prior to Visit  Medication Sig   acetaminophen (TYLENOL) 500 MG tablet Take 500 mg by mouth every 6 (six) hours as needed for mild pain or headache. Reported on 11/02/2015   ALPRAZolam (XANAX) 1 MG tablet Take 0.5 tablets (0.5 mg total) by mouth 2 (two) times daily as needed for anxiety.   Ascorbic Acid (VITAMIN C GUMMIE PO) Take 1 each by mouth every morning.   aspirin EC 81 MG tablet Take 1 tablet (81 mg total) by mouth daily.   Cranberry 240 MG CAPS Take 1 capsule by mouth daily. This is in her probiotic   [employer] ACID CONTROLLER 10 MG tablet SMARTSIG:1 Tablet(s) By Mouth Every 12 Hours PRN   dexamethasone (DECADRON) 4 MG tablet TAKE 1 TABLET TWICE A DAY THE DAY BEFORE, THE DAY OF, AND THE DAY AFTER CHEMOTHERAPY.   fluconazole (DIFLUCAN) 150 MG tablet Take 1 tablet po once. May repeat dose in 3 days as needed for persistent symptoms.   folic acid (FOLVITE) 1 MG tablet TAKE 1 TABLET BY MOUTH EVERY DAY   glimepiride (AMARYL) 2 MG tablet Take 1 tablet (2 mg total) by mouth daily before breakfast.   loratadine (CLARITIN) 10 MG tablet Take 10 mg by mouth daily.   Multiple Vitamin (MULTIVITAMIN WITH MINERALS) TABS tablet Take 1 tablet by mouth every morning.   omeprazole (PRILOSEC) 20 MG capsule TAKE 1 CAPSULE BY MOUTH EVERY DAY   ondansetron (ZOFRAN) 8 MG tablet TAKE 1 TABLET BY MOUTH BEFORE CHEMO   OVER THE COUNTER MEDICATION Take 1 tablet by mouth every morning. (Vitamin A)   phenazopyridine (PYRIDIUM) 200 MG tablet Take 1 tablet (200 mg total) by mouth every 8 (eight) hours as needed.   prochlorperazine (COMPAZINE) 10 MG tablet Take 1 tablet (10 mg total) by mouth every 6 (six) hours as needed for nausea or  vomiting.   rosuvastatin (CRESTOR) 10 MG tablet Take 1 tablet (10 mg total) by mouth daily.   senna-docusate (SENOKOT-S) 8.6-50 MG tablet Take 1 tablet by mouth daily.   Vitamin D, Ergocalciferol, (DRISDOL) 1.25 MG (50000 UNIT) CAPS capsule Take 50,000 Units by mouth once a week. Vit d 3   No facility-administered medications prior to visit.    Review of Systems   Objective    There were no vitals filed for this visit. There is no height or weight on file to calculate BMI.   Physical Exam  ***  No results found for any visits on 06/14/22.  Assessment & Plan     ***  No follow-ups on file.        [name], NP  [facility] at [facility] [phone] (phone) [phone] (fax)  [place]

## 2022-06-13 NOTE — Telephone Encounter (Signed)
Patient called office to inform that she has been taking glimepride, patient stated that her medication made her tongue numb, and her lips swollen, patient has appointment scheduled for tomorrow but would like to know what to do until then, please advise, thanks!

## 2022-06-13 NOTE — Telephone Encounter (Signed)
Spoke to patient she is not having any symptoms right now, she was in the past and was seen at the ED yesterday. Advised patient to discuss medication change with provider. Patient has an appointment scheduled on 10/17.

## 2022-06-14 ENCOUNTER — Ambulatory Visit: Payer: Medicare Other

## 2022-06-14 ENCOUNTER — Other Ambulatory Visit: Payer: Medicare Other

## 2022-06-14 ENCOUNTER — Ambulatory Visit: Payer: Medicare Other | Admitting: Internal Medicine

## 2022-06-14 ENCOUNTER — Ambulatory Visit: Payer: Medicare Other | Admitting: Nurse Practitioner

## 2022-06-15 ENCOUNTER — Encounter: Payer: Self-pay | Admitting: Nurse Practitioner

## 2022-06-15 ENCOUNTER — Ambulatory Visit (INDEPENDENT_AMBULATORY_CARE_PROVIDER_SITE_OTHER): Payer: Medicare Other | Admitting: Nurse Practitioner

## 2022-06-15 VITALS — BP 94/64 | HR 100 | Ht 64.5 in | Wt 158.1 lb

## 2022-06-15 DIAGNOSIS — T7840XD Allergy, unspecified, subsequent encounter: Secondary | ICD-10-CM | POA: Diagnosis not present

## 2022-06-15 DIAGNOSIS — E119 Type 2 diabetes mellitus without complications: Secondary | ICD-10-CM

## 2022-06-15 NOTE — Progress Notes (Signed)
Established patient visit   Patient: Alexis [name]   DOB: [birth year]   71 y.o. Female  MRN: [id number] Visit Date: 06/15/2022  Chief Complaint  Patient presents with   Blood Sugar Problem   Subjective    HPI  Discontinued metformin and started glimepiride 2 mg daily at her most recent visit.  -allergic reaction to medication. -was seen in ED and advised to discontinue taking the glimepiride  -blood sugars running in low 100s despite not being on any medication to control blood sugar.     Medications: Outpatient Medications Prior to Visit  Medication Sig   acetaminophen (TYLENOL) 500 MG tablet Take 500 mg by mouth every 6 (six) hours as needed for mild pain or headache. Reported on 11/02/2015   ALPRAZolam (XANAX) 1 MG tablet Take 0.5 tablets (0.5 mg total) by mouth 2 (two) times daily as needed for anxiety.   Ascorbic Acid (VITAMIN C GUMMIE PO) Take 1 each by mouth every morning.   aspirin EC 81 MG tablet Take 1 tablet (81 mg total) by mouth daily.   Cranberry 240 MG CAPS Take 1 capsule by mouth daily. This is in her probiotic   [employer] ACID CONTROLLER 10 MG tablet SMARTSIG:1 Tablet(s) By Mouth Every 12 Hours PRN   dexamethasone (DECADRON) 4 MG tablet TAKE 1 TABLET TWICE A DAY THE DAY BEFORE, THE DAY OF, AND THE DAY AFTER CHEMOTHERAPY.   fluconazole (DIFLUCAN) 150 MG tablet Take 1 tablet po once. May repeat dose in 3 days as needed for persistent symptoms.   folic acid (FOLVITE) 1 MG tablet TAKE 1 TABLET BY MOUTH EVERY DAY   glimepiride (AMARYL) 2 MG tablet Take 1 tablet (2 mg total) by mouth daily before breakfast.   loratadine (CLARITIN) 10 MG tablet Take 10 mg by mouth daily.   Multiple Vitamin (MULTIVITAMIN WITH MINERALS) TABS tablet Take 1 tablet by mouth every morning.   omeprazole (PRILOSEC) 20 MG capsule TAKE 1 CAPSULE BY MOUTH EVERY DAY   ondansetron (ZOFRAN) 8 MG tablet TAKE 1 TABLET BY MOUTH BEFORE CHEMO   OVER THE COUNTER MEDICATION Take 1 tablet by mouth every morning.  (Vitamin A)   phenazopyridine (PYRIDIUM) 200 MG tablet Take 1 tablet (200 mg total) by mouth every 8 (eight) hours as needed.   prochlorperazine (COMPAZINE) 10 MG tablet Take 1 tablet (10 mg total) by mouth every 6 (six) hours as needed for nausea or vomiting.   rosuvastatin (CRESTOR) 10 MG tablet Take 1 tablet (10 mg total) by mouth daily.   senna-docusate (SENOKOT-S) 8.6-50 MG tablet Take 1 tablet by mouth daily.   Vitamin D, Ergocalciferol, (DRISDOL) 1.25 MG (50000 UNIT) CAPS capsule Take 50,000 Units by mouth once a week. Vit d 3   No facility-administered medications prior to visit.    Review of Systems  Constitutional:  Positive for fatigue. Negative for activity change, appetite change, chills and fever.  HENT:  Negative for congestion, postnasal drip, rhinorrhea, sinus pressure, sinus pain, sneezing and sore throat.   Eyes: Negative.   Respiratory:  Negative for cough, chest tightness, shortness of breath and wheezing.   Cardiovascular:  Negative for chest pain and palpitations.  Gastrointestinal:  Negative for abdominal pain, constipation, diarrhea, nausea and vomiting.  Endocrine: Negative for cold intolerance, heat intolerance, polydipsia and polyuria.       Good blood sugars despite taking no medications to help with blood sguar control   Genitourinary:  Negative for dyspareunia, dysuria, flank pain, frequency and urgency.  Musculoskeletal:  Negative  for arthralgias, back pain and myalgias.  Skin:  Negative for rash.  Allergic/Immunologic: Negative for environmental allergies.       Allergic reaction to starting glimepiride.   Neurological:  Negative for dizziness, weakness and headaches.  Hematological:  Negative for adenopathy.  Psychiatric/Behavioral:  Positive for sleep disturbance. The patient is nervous/anxious.      Objective     Today's Vitals   06/15/22 1528  BP: 94/64  Pulse: 100  SpO2: 100%  Weight: 158 lb 1.9 oz (71.7 kg)  Height: 5' 4.5" (1.638 m)    Body mass index is 26.72 kg/m.   Physical Exam Vitals and nursing note reviewed.  Constitutional:      Appearance: Normal appearance. She is well-developed.  HENT:     Head: Normocephalic and atraumatic.  Eyes:     Pupils: Pupils are equal, round, and reactive to light.  Cardiovascular:     Rate and Rhythm: Normal rate and regular rhythm.     Pulses: Normal pulses.     Heart sounds: Normal heart sounds.  Pulmonary:     Effort: Pulmonary effort is normal.     Breath sounds: Normal breath sounds.  Abdominal:     Palpations: Abdomen is soft.  Musculoskeletal:        General: Normal range of motion.     Cervical back: Normal range of motion and neck supple.  Lymphadenopathy:     Cervical: No cervical adenopathy.  Skin:    General: Skin is warm and dry.     Capillary Refill: Capillary refill takes less than 2 seconds.  Neurological:     General: No focal deficit present.     Mental Status: She is alert and oriented to person, place, and time.  Psychiatric:        Mood and Affect: Mood normal.        Behavior: Behavior normal.        Thought Content: Thought content normal.        Judgment: Judgment normal.      Results for orders placed or performed in visit on 06/15/22  Microalbumin, urine  Result Value Ref Range   Microalb, Ur normal   Microalbumin / creatinine urine ratio  Result Value Ref Range   Microalb Creat Ratio normal   Protein / creatinine ratio, urine  Result Value Ref Range   Creatinine, Urine normal    Albumin, U nomal     Assessment & Plan     1. Diabetes mellitus without complication (HCC) Continue glimepiride.  No new medications started for diabetes today.  Patient to keep a log of fasting blood sugars.  She understands goal is to have fasting blood sugars between 70 and 110.  We will consider new treatment if blood sugars are consistently running over 150.  She will follow-up in 6 weeks and bring blood sugar log with her to review.  2.  Allergic reaction to drug, subsequent encounter Patient recently seen in ER due to allergic reaction to glimepiride.  Reviewed notes.  Added glimepiride to allergy list.  Reassess in 6 weeks.  Return in about 6 weeks (around 07/27/2022) for blood sugar. bring log .        [name], NP  [facility] at [facility] [phone] (phone) [phone] (fax)  [place]

## 2022-06-24 MED FILL — Dexamethasone Sodium Phosphate Inj 100 MG/10ML: INTRAMUSCULAR | Qty: 1 | Status: AC

## 2022-06-27 ENCOUNTER — Inpatient Hospital Stay: Payer: Medicare Other

## 2022-06-27 ENCOUNTER — Inpatient Hospital Stay (HOSPITAL_BASED_OUTPATIENT_CLINIC_OR_DEPARTMENT_OTHER): Payer: Medicare Other | Admitting: Internal Medicine

## 2022-06-27 ENCOUNTER — Other Ambulatory Visit: Payer: Self-pay | Admitting: Medical Oncology

## 2022-06-27 ENCOUNTER — Other Ambulatory Visit: Payer: Self-pay

## 2022-06-27 VITALS — BP 145/77 | HR 100 | Temp 97.6°F | Resp 18 | Wt 159.0 lb

## 2022-06-27 DIAGNOSIS — C7931 Secondary malignant neoplasm of brain: Secondary | ICD-10-CM | POA: Diagnosis not present

## 2022-06-27 DIAGNOSIS — C3411 Malignant neoplasm of upper lobe, right bronchus or lung: Secondary | ICD-10-CM | POA: Diagnosis not present

## 2022-06-27 DIAGNOSIS — Z23 Encounter for immunization: Secondary | ICD-10-CM

## 2022-06-27 DIAGNOSIS — F419 Anxiety disorder, unspecified: Secondary | ICD-10-CM | POA: Diagnosis not present

## 2022-06-27 DIAGNOSIS — Z5112 Encounter for antineoplastic immunotherapy: Secondary | ICD-10-CM | POA: Diagnosis not present

## 2022-06-27 DIAGNOSIS — Z79899 Other long term (current) drug therapy: Secondary | ICD-10-CM | POA: Diagnosis not present

## 2022-06-27 LAB — CBC WITH DIFFERENTIAL (CANCER CENTER ONLY)
Abs Immature Granulocytes: 0.08 10*3/uL — ABNORMAL HIGH (ref 0.00–0.07)
Basophils Absolute: 0 10*3/uL (ref 0.0–0.1)
Basophils Relative: 0 %
Eosinophils Absolute: 0 10*3/uL (ref 0.0–0.5)
Eosinophils Relative: 0 %
HCT: 36.8 % (ref 36.0–46.0)
Hemoglobin: 11.7 g/dL — ABNORMAL LOW (ref 12.0–15.0)
Immature Granulocytes: 1 %
Lymphocytes Relative: 6 %
Lymphs Abs: 0.6 10*3/uL — ABNORMAL LOW (ref 0.7–4.0)
MCH: 31.8 pg (ref 26.0–34.0)
MCHC: 31.8 g/dL (ref 30.0–36.0)
MCV: 100 fL (ref 80.0–100.0)
Monocytes Absolute: 0.7 10*3/uL (ref 0.1–1.0)
Monocytes Relative: 7 %
Neutro Abs: 8.1 10*3/uL — ABNORMAL HIGH (ref 1.7–7.7)
Neutrophils Relative %: 86 %
Platelet Count: 307 10*3/uL (ref 150–400)
RBC: 3.68 MIL/uL — ABNORMAL LOW (ref 3.87–5.11)
RDW: 14.8 % (ref 11.5–15.5)
WBC Count: 9.5 10*3/uL (ref 4.0–10.5)
nRBC: 0 % (ref 0.0–0.2)

## 2022-06-27 LAB — CMP (CANCER CENTER ONLY)
ALT: 10 U/L (ref 0–44)
AST: 11 U/L — ABNORMAL LOW (ref 15–41)
Albumin: 3.9 g/dL (ref 3.5–5.0)
Alkaline Phosphatase: 82 U/L (ref 38–126)
Anion gap: 10 (ref 5–15)
BUN: 18 mg/dL (ref 8–23)
CO2: 26 mmol/L (ref 22–32)
Calcium: 9.8 mg/dL (ref 8.9–10.3)
Chloride: 103 mmol/L (ref 98–111)
Creatinine: 0.83 mg/dL (ref 0.44–1.00)
GFR, Estimated: >60 mL/min (ref >60)
Glucose, Bld: 172 mg/dL — ABNORMAL HIGH (ref 70–99)
Potassium: 4.1 mmol/L (ref 3.5–5.1)
Sodium: 139 mmol/L (ref 135–145)
Total Bilirubin: 0.4 mg/dL (ref 0.3–1.2)
Total Protein: 7.2 g/dL (ref 6.5–8.1)

## 2022-06-27 MED ORDER — SODIUM CHLORIDE 0.9 % IV SOLN: Freq: Once | INTRAVENOUS | Status: AC

## 2022-06-27 MED ORDER — INFLUENZA VAC A&B SA ADJ QUAD 0.5 ML IM PRSY
0.5000 mL | PREFILLED_SYRINGE | Freq: Once | INTRAMUSCULAR | Status: AC
  Administered 2022-06-27: 0.5 mL via INTRAMUSCULAR
  Filled 2022-06-27: qty 0.5

## 2022-06-27 MED ORDER — SODIUM CHLORIDE 0.9 % IV SOLN
10.0000 mg | Freq: Once | INTRAVENOUS | Status: AC
  Administered 2022-06-27: 10 mg via INTRAVENOUS
  Filled 2022-06-27: qty 10

## 2022-06-27 MED ORDER — SODIUM CHLORIDE 0.9 % IV SOLN
500.0000 mg/m2 | Freq: Once | INTRAVENOUS | Status: AC
  Administered 2022-06-27: 900 mg via INTRAVENOUS
  Filled 2022-06-27: qty 20

## 2022-06-27 MED ORDER — CYANOCOBALAMIN 1000 MCG/ML IJ SOLN
1000.0000 ug | Freq: Once | INTRAMUSCULAR | Status: AC
  Administered 2022-06-27: 1000 ug via INTRAMUSCULAR
  Filled 2022-06-27: qty 1

## 2022-06-27 NOTE — Progress Notes (Signed)
[facility] [phone]   [phone]  OFFICE PROGRESS NOTE  [name], NP [place] [address]  DIAGNOSIS: Stage IV (T2a, N0, M1b) non-small cell lung cancer, adenocarcinoma with negative EGFR and ALK mutations diagnosed in January 2015 and presented with right upper lobe lung mass in addition to a solitary brain metastasis.  PRIOR THERAPY: 1) Status post stereotactic radiotherapy to a solitary right parietal brain lesion under the care of Dr. [name] on 10/16/2013. 2) Status post palliative radiotherapy to the right lung tumor under the care of Dr. [name] completed on 12/05/2013. 3) Systemic chemotherapy with carboplatin for AUC of 5 and Alimta 500 mg/M2 every 3 weeks. First dose Jan 06 2014. Status post 6 cycles.  CURRENT THERAPY: Systemic chemotherapy with maintenance Alimta 500 MG/M2 every 3 weeks, status post 138 cycles.  INTERVAL HISTORY: Alexis [name] 71 y.o. female returns to the clinic today for follow-up visit accompanied by her husband.  The patient is feeling fine today with no concerning complaints but she was sad after she lost her dog.  She denied having any current chest pain, shortness of breath, cough or hemoptysis.  She denied having any fever or chills.  She has no nausea, vomiting, diarrhea or constipation.  She has no headache or visual changes.  She denied having any recent weight loss or night sweats.  She is here today for evaluation before starting cycle #139 of her treatment.   MEDICAL HISTORY: Past Medical History:  Diagnosis Date   Anxiety    Anxiety 06/20/2016   Cervical cancer ([facility])    Diabetes mellitus without complication ([facility])    patient states she has type 2   Encounter for antineoplastic chemotherapy 07/20/2015   Malignant neoplasm of right upper lobe of lung (H[facility])     non small cell lung cancer adenocarcioma with brain meta    ALLERGIES:  is allergic to glimepiride and  codeine.  MEDICATIONS:  Current Outpatient Medications  Medication Sig Dispense Refill   acetaminophen (TYLENOL) 500 MG tablet Take 500 mg by mouth every 6 (six) hours as needed for mild pain or headache. Reported on 11/02/2015     ALPRAZolam (XANAX) 1 MG tablet Take 0.5 tablets (0.5 mg total) by mouth 2 (two) times daily as needed for anxiety. 30 tablet 2   Ascorbic Acid (VITAMIN C GUMMIE PO) Take 1 each by mouth every morning.     aspirin EC 81 MG tablet Take 1 tablet (81 mg total) by mouth daily. 150 tablet 2   Cranberry 240 MG CAPS Take 1 capsule by mouth daily. This is in her probiotic     [employer] ACID CONTROLLER 10 MG tablet SMARTSIG:1 Tablet(s) By Mouth Every 12 Hours PRN     dexamethasone (DECADRON) 4 MG tablet TAKE 1 TABLET TWICE A DAY THE DAY BEFORE, THE DAY OF, AND THE DAY AFTER CHEMOTHERAPY. 40 tablet 2   fluconazole (DIFLUCAN) 150 MG tablet Take 1 tablet po once. May repeat dose in 3 days as needed for persistent symptoms. 3 tablet 1   folic acid (FOLVITE) 1 MG tablet TAKE 1 TABLET BY MOUTH EVERY DAY 90 tablet 0   glimepiride (AMARYL) 2 MG tablet Take 1 tablet (2 mg total) by mouth daily before breakfast. 30 tablet 2   loratadine (CLARITIN) 10 MG tablet Take 10 mg by mouth daily.     Multiple Vitamin (MULTIVITAMIN WITH MINERALS) TABS tablet Take 1 tablet by mouth  every morning.     omeprazole (PRILOSEC) 20 MG capsule TAKE 1 CAPSULE BY MOUTH EVERY DAY 90 capsule 0   ondansetron (ZOFRAN) 8 MG tablet TAKE 1 TABLET BY MOUTH BEFORE CHEMO 30 tablet 1   OVER THE COUNTER MEDICATION Take 1 tablet by mouth every morning. (Vitamin A)     phenazopyridine (PYRIDIUM) 200 MG tablet Take 1 tablet (200 mg total) by mouth every 8 (eight) hours as needed. 10 tablet 0   prochlorperazine (COMPAZINE) 10 MG tablet Take 1 tablet (10 mg total) by mouth every 6 (six) hours as needed for nausea or vomiting. 60 tablet 0   rosuvastatin (CRESTOR) 10 MG tablet Take 1 tablet (10 mg total) by mouth daily. 30 tablet 12    senna-docusate (SENOKOT-S) 8.6-50 MG tablet Take 1 tablet by mouth daily. 30 tablet prn   Vitamin D, Ergocalciferol, (DRISDOL) 1.25 MG (50000 UNIT) CAPS capsule Take 50,000 Units by mouth once a week. Vit d 3     No current facility-administered medications for this visit.    SURGICAL HISTORY:  Past Surgical History:  Procedure Laterality Date   ABDOMINAL HYSTERECTOMY     COLOSTOMY TAKEDOWN N/A 07/10/2014   Procedure: LAPAROSCOPIC LYSIS OF ADHESIONS (90 MIN) LAPAROSCOPIC ASSISTED COLOSTOMY CLOSURE, RIGID PROCTOSIGMOIDOSCOPY;  Surgeon: Jackolyn Confer, MD;  Location: [facility];  Service: General;  Laterality: N/A;   LAPAROTOMY N/A 11/03/2013   Procedure: EXPLORATORY LAPAROTOMY, DRAINAGE OF INTRA  ABDOMINAL ABSCESSES, MOBILIZATION OF SPLENIC FLEXURE, SIGMOID COLECTOMY WITH COLOSTOMY;  Surgeon: Odis Hollingshead, MD;  Location: [facility];  Service: General;  Laterality: N/A;   VIDEO BRONCHOSCOPY Bilateral 08/30/2013   Procedure: VIDEO BRONCHOSCOPY WITH FLUORO;  Surgeon: Tanda Rockers, MD;  Location: [facility];  Service: Cardiopulmonary;  Laterality: Bilateral;    REVIEW OF SYSTEMS:  A comprehensive review of systems was negative.   PHYSICAL EXAMINATION: General appearance: alert, cooperative, and no distress Head: Normocephalic, without obvious abnormality, atraumatic Neck: no adenopathy, no JVD, supple, symmetrical, trachea midline, and thyroid not enlarged, symmetric, no tenderness/mass/nodules Lymph nodes: Cervical, supraclavicular, and axillary nodes normal. Resp: clear to auscultation bilaterally Back: symmetric, no curvature. ROM normal. No CVA tenderness. Cardio: regular rate and rhythm, S1, S2 normal, no murmur, click, rub or gallop GI: soft, non-tender; bowel sounds normal; no masses,  no organomegaly Extremities: extremities normal, atraumatic, no cyanosis or edema   ECOG PERFORMANCE STATUS: 1 - Symptomatic but completely ambulatory   Blood pressure (!) 145/77, pulse 100,  temperature 97.6 F (36.4 C), temperature source Oral, resp. rate 18, weight 159 lb (72.1 kg), SpO2 100 %.  LABORATORY DATA: Lab Results  Component Value Date   WBC 6.0 06/12/2022   HGB 11.7 (L) 06/12/2022   HCT 37.2 06/12/2022   MCV 103.0 (H) 06/12/2022   PLT 451 (H) 06/12/2022      Chemistry      Component Value Date/Time   NA 137 06/12/2022 1500   NA 137 08/31/2021 0900   NA 139 08/14/2017 0837   K 4.2 06/12/2022 1500   K 4.4 08/14/2017 0837   CL 105 06/12/2022 1500   CO2 27 06/12/2022 1500   CO2 24 08/14/2017 0837   BUN 26 (H) 06/12/2022 1500   BUN 24 08/31/2021 0900   BUN 13.3 08/14/2017 0837   CREATININE 0.81 06/12/2022 1500   CREATININE 0.96 06/06/2022 1039   CREATININE 0.8 08/14/2017 0837      Component Value Date/Time   CALCIUM 9.2 06/12/2022 1500   CALCIUM 9.3 08/14/2017 0837  ALKPHOS 77 06/06/2022 1039   ALKPHOS 107 08/14/2017 0837   AST 14 (L) 06/06/2022 1039   AST 12 08/14/2017 0837   ALT 11 06/06/2022 1039   ALT 12 08/14/2017 0837   BILITOT 0.3 06/06/2022 1039   BILITOT 0.37 08/14/2017 0837       RADIOGRAPHIC STUDIES: No results found.  ASSESSMENT AND PLAN:  This is a very pleasant 71 years old white female with metastatic non-small cell lung cancer, adenocarcinoma status post induction systemic chemotherapy with carboplatin and Alimta with partial response.  The patient has no actionable EGFR or ALK mutations at the time of her diagnosis. The patient is currently on maintenance treatment with single agent Alimta status post 138 cycles. The patient has been tolerating her treatment well with no concerning adverse effects. I recommended for her to proceed with cycle #139 today as planned. I will see her back for follow-up visit in 3 weeks for evaluation before the next cycle of her treatment. For the anxiety she is currently on Xanax. She was advised to call immediately if she has any other concerning symptoms in the interval. The patient voices  understanding of current disease status and treatment options and is in agreement with the current care plan. All questions were answered. The patient knows to call the clinic with any problems, questions or concerns. We can certainly see the patient much sooner if necessary.  Disclaimer: This note was dictated with voice recognition software. Similar sounding words can inadvertently be transcribed and may not be corrected upon review.

## 2022-06-27 NOTE — Patient Instructions (Addendum)
[facility]  Discharge Instructions: Thank you for choosing [facility] to provide your oncology and hematology care.   If you have a lab appointment with the [facility], please go directly to the [facility] and check in at the registration area.   Wear comfortable clothing and clothing appropriate for easy access to any Portacath or PICC line.   We strive to give you quality time with your provider. You may need to reschedule your appointment if you arrive late (15 or more minutes).  Arriving late affects you and other patients whose appointments are after yours.  Also, if you miss three or more appointments without notifying the office, you may be dismissed from the clinic at the provider's discretion.      For prescription refill requests, have your pharmacy contact our office and allow 72 hours for refills to be completed.    Today you received the following chemotherapy and/or immunotherapy agents: Alimta      To help prevent nausea and vomiting after your treatment, we encourage you to take your nausea medication as directed.  BELOW ARE SYMPTOMS THAT SHOULD BE REPORTED IMMEDIATELY: *FEVER GREATER THAN 100.4 F (38 C) OR HIGHER *CHILLS OR SWEATING *NAUSEA AND VOMITING THAT IS NOT CONTROLLED WITH YOUR NAUSEA MEDICATION *UNUSUAL SHORTNESS OF BREATH *UNUSUAL BRUISING OR BLEEDING *URINARY PROBLEMS (pain or burning when urinating, or frequent urination) *BOWEL PROBLEMS (unusual diarrhea, constipation, pain near the anus) TENDERNESS IN MOUTH AND THROAT WITH OR WITHOUT PRESENCE OF ULCERS (sore throat, sores in mouth, or a toothache) UNUSUAL RASH, SWELLING OR PAIN  UNUSUAL VAGINAL DISCHARGE OR ITCHING   Items with * indicate a potential emergency and should be followed up as soon as possible or go to the Emergency Department if any problems should occur.  Please show the CHEMOTHERAPY ALERT CARD or IMMUNOTHERAPY ALERT CARD at check-in to the  Emergency Department and triage nurse.  Should you have questions after your visit or need to cancel or reschedule your appointment, please contact [facility]  Dept: [phone]  and follow the prompts.  Office hours are 8:00 a.m. to 4:30 p.m. Monday - Friday. Please note that voicemails left after 4:00 p.m. may not be returned until the following business day.  We are closed weekends and major holidays. You have access to a nurse at all times for urgent questions. Please call the main number to the clinic Dept: [phone] and follow the prompts.   For any non-urgent questions, you may also contact your provider using MyChart. We now offer e-Visits for anyone 54 and older to request care online for non-urgent symptoms. For details visit [email or web address].   Also download the MyChart app! Go to the app store, search "MyChart", open the app, select [facility], and log in with your MyChart username and password.  Masks are optional in the cancer centers. If you would like for your care team to wear a mask while they are taking care of you, please let them know. You may have one support person who is at least 70 years old accompany you for your appointments. Influenza Vaccine Injection What is this medication? INFLUENZA VACCINE (in floo EN zuh vak SEEN) reduces the risk of the influenza (flu). It does not treat influenza. It is still possible to get influenza after receiving this vaccine, but the symptoms may be less severe or not last as long. It works by helping your immune system learn how to fight off  a future infection. This medicine may be used for other purposes; ask your health care provider or pharmacist if you have questions. COMMON BRAND NAME(S): Afluria, Afluria Quadrivalent, Agriflu, Alfuria, FLUAD, FLUAD Quadrivalent, Fluarix, Fluarix Quadrivalent, Flublok, Flublok Quadrivalent, FLUCELVAX, FLUCELVAX Quadrivalent, Flulaval, Flulaval Quadrivalent,  Fluvirin, Fluzone, Fluzone High-Dose, Fluzone Intradermal, Fluzone Quadrivalent What should I tell my care team before I take this medication? They need to know if you have any of these conditions: Bleeding disorder like hemophilia Fever or infection Guillain-Barre syndrome or other neurological problems Immune system problems Infection with the human immunodeficiency virus (HIV) or AIDS Low blood platelet counts Multiple sclerosis An unusual or allergic reaction to influenza virus vaccine, latex, other medications, foods, dyes, or preservatives. Different brands of vaccines contain different allergens. Some may contain latex or eggs. Talk to your care team about your allergies to make sure that you get the right vaccine. Pregnant or trying to get pregnant Breastfeeding How should I use this medication? This vaccine is injected into a muscle or under the skin. It is given by your care team. A copy of Vaccine Information Statements will be given before each vaccination. Be sure to read this sheet carefully each time. This sheet may change often. Talk to your care team to see which vaccines are right for you. Some vaccines should not be used in all age groups. Overdosage: If you think you have taken too much of this medicine contact a poison control center or emergency room at once. NOTE: This medicine is only for you. Do not share this medicine with others. What if I miss a dose? This does not apply. What may interact with this medication? Certain medications that lower your immune system, such as etanercept, anakinra, infliximab, adalimumab Certain medications that prevent or treat blood clots, such as warfarin Chemotherapy or radiation therapy Phenytoin Steroid medications, such as prednisone or cortisone Theophylline Vaccines This list may not describe all possible interactions. Give your health care provider a list of all the medicines, herbs, non-prescription drugs, or dietary  supplements you use. Also tell them if you smoke, drink alcohol, or use illegal drugs. Some items may interact with your medicine. What should I watch for while using this medication? Report any side effects that do not go away with your care team. Call your care team if any unusual symptoms occur within 6 weeks of receiving this vaccine. You may still catch the flu, but the illness is not usually as bad. You cannot get the flu from the vaccine. The vaccine will not protect against colds or other illnesses that may cause fever. The vaccine is needed every year. What side effects may I notice from receiving this medication? Side effects that you should report to your care team as soon as possible: Allergic reactions--skin rash, itching, hives, swelling of the face, lips, tongue, or throat Side effects that usually do not require medical attention (report these to your care team if they continue or are bothersome): Chills Fatigue Headache Joint pain Loss of appetite Muscle pain Nausea Pain, redness, or irritation at injection site This list may not describe all possible side effects. Call your doctor for medical advice about side effects. You may report side effects to FDA at 1-800-FDA-1088. Where should I keep my medication? The vaccine is only given by your care team. It will not be stored at home. NOTE: This sheet is a summary. It may not cover all possible information. If you have questions about this medicine, talk to your doctor,  pharmacist, or health care provider.  2023 Elsevier/Gold Standard (2007-10-06 00:00:00)

## 2022-06-28 ENCOUNTER — Other Ambulatory Visit: Payer: Self-pay

## 2022-06-29 ENCOUNTER — Other Ambulatory Visit: Payer: Self-pay

## 2022-07-01 DIAGNOSIS — N3020 Other chronic cystitis without hematuria: Secondary | ICD-10-CM | POA: Diagnosis not present

## 2022-07-01 DIAGNOSIS — N952 Postmenopausal atrophic vaginitis: Secondary | ICD-10-CM | POA: Diagnosis not present

## 2022-07-05 ENCOUNTER — Other Ambulatory Visit: Payer: Self-pay

## 2022-07-05 DIAGNOSIS — H21262 Iris atrophy (essential) (progressive), left eye: Secondary | ICD-10-CM | POA: Diagnosis not present

## 2022-07-05 DIAGNOSIS — Z961 Presence of intraocular lens: Secondary | ICD-10-CM | POA: Diagnosis not present

## 2022-07-05 DIAGNOSIS — H524 Presbyopia: Secondary | ICD-10-CM | POA: Diagnosis not present

## 2022-07-06 ENCOUNTER — Ambulatory Visit
Admission: RE | Admit: 2022-07-06 | Discharge: 2022-07-06 | Disposition: A | Discharge status: Home / Self Care | Payer: Medicare Other | Source: Ambulatory Visit | Attending: Radiation Oncology | Admitting: Radiation Oncology

## 2022-07-06 DIAGNOSIS — C7931 Secondary malignant neoplasm of brain: Secondary | ICD-10-CM

## 2022-07-06 MED ORDER — GADOPICLENOL 0.5 MMOL/ML IV SOLN
7.0000 mL | Freq: Once | INTRAVENOUS | Status: AC | PRN
  Administered 2022-07-06: 7 mL via INTRAVENOUS

## 2022-07-08 ENCOUNTER — Encounter: Payer: Self-pay | Admitting: Nurse Practitioner

## 2022-07-11 ENCOUNTER — Inpatient Hospital Stay: Admission: RE | Admit: 2022-07-11 | Payer: Medicare Other | Source: Ambulatory Visit | Admitting: Radiation Oncology

## 2022-07-11 ENCOUNTER — Ambulatory Visit
Admission: RE | Admit: 2022-07-11 | Discharge: 2022-07-11 | Disposition: A | Discharge status: Home / Self Care | Payer: Medicare Other | Source: Ambulatory Visit | Attending: Radiation Oncology | Admitting: Radiation Oncology

## 2022-07-11 DIAGNOSIS — C7931 Secondary malignant neoplasm of brain: Secondary | ICD-10-CM | POA: Diagnosis not present

## 2022-07-11 DIAGNOSIS — C3411 Malignant neoplasm of upper lobe, right bronchus or lung: Secondary | ICD-10-CM

## 2022-07-11 NOTE — Progress Notes (Signed)
Patient was not available for this call. A voicemail to return my call was left w/ my extension [phone].  Leandra Kern, LPN

## 2022-07-11 NOTE — Progress Notes (Signed)
Radiation Oncology         [phone] ________________________________  Outpatient Follow Up- Conducted via telephone at patient request.  I spoke with the patient to conduct this consult visit via telephone. The patient was notified in advance and was offered an in person or telemedicine meeting to allow for face to face communication but instead preferred to proceed with a telephone visit.  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  Prior Radiation:    10/28/2013 through 12/05/2013:   The patient was treated to the right lung tumor to a dose of 50 gray in 25 fractions using a 3-D conformal technique. Daily image guidance was used for the patient's treatment.  10/16/2013 SRS Treatment: PTV1: Rt Parietal 9mm target was treated using 3 Arcs to a prescription dose of 20 Gy. ExacTrac Snap verification was performed for each couch angle.  Narrative:     In summary this is a 71 y.o. patient with a history of metastatic lung cancer to the brain who was treated in two sessions in 2015 for her brain disease, followed by local control to the right lung.  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 She is no longer taking these medications.  She remains on maintenance Alimta with Dr. Arbutus Ped every 3 weeks and has had a total of 139 cycles.   Her last MRI brain on 07/06/2022 showed stable changes in the right parietal lesion that has previously been radiated.  No evidence of growth or new lesions were seen.  Mild vasogenic edema persists but is felt to be again stable.  She continues to have small  mastoid effusion more noted on the left, and sinus thickening in the ethmoids. She's contacted today to review  these results.    ROS: The patient is doing ***  Past Medical History:  Past Medical History:  Diagnosis Date   Anxiety    Anxiety 06/20/2016   Cervical cancer (HCC)    Diabetes mellitus without complication (HCC)    patient states she has type 2   Encounter for antineoplastic chemotherapy 07/20/2015   Malignant neoplasm of right upper lobe of lung (HCC)     non small cell lung cancer adenocarcioma with brain meta    Past Surgical History: Past Surgical History:  Procedure Laterality Date   ABDOMINAL HYSTERECTOMY     COLOSTOMY TAKEDOWN N/A 07/10/2014   Procedure: LAPAROSCOPIC LYSIS OF ADHESIONS (90 MIN) LAPAROSCOPIC ASSISTED COLOSTOMY CLOSURE, RIGID PROCTOSIGMOIDOSCOPY;  Surgeon: Avel Peace, MD;  Location: [facility];  Service: General;  Laterality: N/A;   LAPAROTOMY N/A 11/03/2013   Procedure: EXPLORATORY LAPAROTOMY, DRAINAGE OF INTRA  ABDOMINAL ABSCESSES, MOBILIZATION OF SPLENIC FLEXURE, SIGMOID COLECTOMY WITH COLOSTOMY;  Surgeon: Adolph Pollack, MD;  Location: [facility];  Service: General;  Laterality: N/A;   VIDEO BRONCHOSCOPY Bilateral 08/30/2013   Procedure: VIDEO BRONCHOSCOPY WITH FLUORO;  Surgeon: Nyoka Cowden, MD;  Location: [facility];  Service: Cardiopulmonary;  Laterality: Bilateral;    Social History:  Social History   Socioeconomic History   Marital status: Married    Spouse name: Not on file   Number of children: Not on file   Years of education: Not on file   Highest education level: Not  on file  Occupational History   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  Tobacco Use   Smoking status: Former    Packs/day: 1.00    Years: 40.00    Total pack years: 40.00    Types: Cigarettes    Quit date: 09/27/2013    Years since quitting: 8.7   Smokeless tobacco: Never  Vaping Use   Vaping Use: Never used  Substance and Sexual Activity   Alcohol use: No   Drug use: No   Sexual activity: Yes  Other Topics Concern   Not on file  Social History  Narrative   Not on file   Social Determinants of Health   Financial Resource Strain: Not on file  Food Insecurity: Not on file  Transportation Needs: Not on file  Physical Activity: Not on file  Stress: Not on file  Social Connections: Not on file  Intimate Partner Violence: Not on file  The patient is married. She lives in [place], [place].  Family History: Family History  Problem Relation Age of Onset   Emphysema Father        smoked   Lung cancer Father        smoked   Cancer Mother    Hypertension Mother    COPD Mother     ALLERGIES:  is allergic to glimepiride and codeine.  Meds: Current Outpatient Medications  Medication Sig Dispense Refill   acetaminophen (TYLENOL) 500 MG tablet Take 500 mg by mouth every 6 (six) hours as needed for mild pain or headache. Reported on 11/02/2015     ALPRAZolam (XANAX) 1 MG tablet Take 0.5 tablets (0.5 mg total) by mouth 2 (two) times daily as needed for anxiety. 30 tablet 2   Ascorbic Acid (VITAMIN C GUMMIE PO) Take 1 each by mouth every morning.     aspirin EC 81 MG tablet Take 1 tablet (81 mg total) by mouth daily. 150 tablet 2   Cranberry 240 MG CAPS Take 1 capsule by mouth daily. This is in her probiotic     [employer] ACID CONTROLLER 10 MG tablet SMARTSIG:1 Tablet(s) By Mouth Every 12 Hours PRN     dexamethasone (DECADRON) 4 MG tablet TAKE 1 TABLET TWICE A DAY THE DAY BEFORE, THE DAY OF, AND THE DAY AFTER CHEMOTHERAPY. 40 tablet 2   fluconazole (DIFLUCAN) 150 MG tablet Take 1 tablet po once. May repeat dose in 3 days as needed for persistent symptoms. 3 tablet 1   folic acid (FOLVITE) 1 MG tablet TAKE 1 TABLET BY MOUTH EVERY DAY 90 tablet 0   glimepiride (AMARYL) 2 MG tablet Take 1 tablet (2 mg total) by mouth daily before breakfast. 30 tablet 2   loratadine (CLARITIN) 10 MG tablet Take 10 mg by mouth daily.     Multiple Vitamin (MULTIVITAMIN WITH MINERALS) TABS tablet Take 1 tablet by mouth every morning.     omeprazole (PRILOSEC)  20 MG capsule TAKE 1 CAPSULE BY MOUTH EVERY DAY 90 capsule 0   ondansetron (ZOFRAN) 8 MG tablet TAKE 1 TABLET BY MOUTH BEFORE CHEMO 30 tablet 1   OVER THE COUNTER MEDICATION Take 1 tablet by mouth every morning. (Vitamin A)     phenazopyridine (PYRIDIUM) 200 MG tablet Take 1 tablet (200 mg total) by mouth every 8 (eight) hours as needed. 10 tablet 0   prochlorperazine (COMPAZINE) 10 MG tablet Take 1 tablet (10 mg total) by mouth every 6 (six) hours as needed for nausea or vomiting. 60 tablet 0  rosuvastatin (CRESTOR) 10 MG tablet Take 1 tablet (10 mg total) by mouth daily. 30 tablet 12   senna-docusate (SENOKOT-S) 8.6-50 MG tablet Take 1 tablet by mouth daily. 30 tablet prn   Vitamin D, Ergocalciferol, (DRISDOL) 1.25 MG (50000 UNIT) CAPS capsule Take 50,000 Units by mouth once a week. Vit d 3     No current facility-administered medications for this visit.    Physical Findings: Unable to assess due to type of encounter  Lab Findings: Lab Results  Component Value Date   WBC 9.5 06/27/2022   HGB 11.7 (L) 06/27/2022   HCT 36.8 06/27/2022   MCV 100.0 06/27/2022   PLT 307 06/27/2022     Radiographic Findings: MR Brain W Wo Contrast  Result Date: 07/09/2022 CLINICAL DATA:  Provided history: Brain metastases, assess treatment response. EXAM: MRI HEAD WITHOUT AND WITH CONTRAST TECHNIQUE: Multiplanar, multiecho pulse sequences of the brain and surrounding structures were obtained without and with intravenous contrast. CONTRAST:  7 mL Vueway intravenous contrast. COMPARISON:  Prior brain MRI examinations 01/07/2022 and earlier. FINDINGS: Brain: No age advanced or lobar predominant parenchymal atrophy. 16 mm irregularly enhancing lesion within the right parietal lobe, unchanged in size from the prior brain MRI of 01/07/2022. Unchanged chronic blood products at this site. T2 FLAIR hyperintense signal abnormality within the surrounding cerebral white matter, also stable. No new intracranial  metastasis is identified. Moderate multifocal T2 FLAIR hyperintense signal abnormality elsewhere within the cerebral white matter, nonspecific but compatible with chronic small vessel disease. Redemonstrated chronic lacunar infarcts within the left basal ganglia. Tiny chronic lacunar infarct within the left thalamus, new from the prior MRI (series 10, image 31). There is no acute infarct. No extra-axial fluid collection. No midline shift. Vascular: Maintained flow voids within the proximal large arterial vessels. Skull and upper cervical spine: No focal suspicious marrow lesion. Sinuses/Orbits: No mass or acute finding within the imaged orbits. Prior bilateral ocular lens replacement. Mild mucosal thickening within the anterior ethmoid air cells, bilaterally. Other: Small left mastoid effusion. IMPRESSION: 1. 16 mm treated metastasis within the right parietal lobe, stable in size from the prior brain MRI of 01/07/2022. Surrounding T2 FLAIR hyperintense signal abnormality has also remained stable. 2. No new intracranial metastasis is identified. 3. New tiny chronic lacunar infarct within the left thalamus. 4. Moderate chronic small-vessel ischemic changes are otherwise stable. 5. Mild ethmoid sinus mucosal thickening. 6. Small left mastoid effusion. Electronically Signed   By: Jackey Loge D.O.   On: 07/09/2022 13:34     Impression/Plan: 1. 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 ***     This encounter was conducted via telephone.  The patient has provided two factor identification and has given verbal consent for this type of encounter and has been advised to only accept a meeting of this type in a secure network environment. The time spent during this encounter was *** minutes including preparation, discussion, and coordination of the patient's care. The attendants for this meeting include  Ronny Bacon  and Caren Macadam.  During the encounter, Ronny Bacon was located at [facility] Radiation Oncology Department.  Caren Macadam was located at home.       Osker Mason, PAC

## 2022-07-12 ENCOUNTER — Other Ambulatory Visit: Payer: Self-pay | Admitting: Radiation Therapy

## 2022-07-12 ENCOUNTER — Ambulatory Visit: Admission: RE | Admit: 2022-07-12 | Payer: Medicare Other | Source: Ambulatory Visit | Admitting: Radiation Oncology

## 2022-07-12 ENCOUNTER — Encounter: Payer: Self-pay | Admitting: Radiation Oncology

## 2022-07-12 ENCOUNTER — Ambulatory Visit
Admission: RE | Admit: 2022-07-12 | Discharge: 2022-07-12 | Disposition: A | Discharge status: Home / Self Care | Payer: Medicare Other | Source: Ambulatory Visit | Attending: Radiation Oncology | Admitting: Radiation Oncology

## 2022-07-12 DIAGNOSIS — C7931 Secondary malignant neoplasm of brain: Secondary | ICD-10-CM

## 2022-07-12 DIAGNOSIS — C3411 Malignant neoplasm of upper lobe, right bronchus or lung: Secondary | ICD-10-CM

## 2022-07-12 NOTE — Progress Notes (Signed)
[facility] OFFICE PROGRESS NOTE  [name], NP [place] [address]  DIAGNOSIS: Stage IV (T2a, N0, M1b) non-small cell lung cancer, adenocarcinoma with negative EGFR and ALK mutations diagnosed in January 2015 and presented with right upper lobe lung mass in addition to a solitary brain metastasis   PRIOR THERAPY: 1) Status post stereotactic radiotherapy to a solitary right parietal brain lesion under the care of Dr. [name] on 10/16/2013. 2) Status post palliative radiotherapy to the right lung tumor under the care of Dr. [name] completed on 12/05/2013. 3) Systemic chemotherapy with carboplatin for AUC of 5 and Alimta 500 mg/M2 every 3 weeks. First dose Jan 06 2014. Status post 6 cycles.  CURRENT THERAPY: Systemic chemotherapy with maintenance Alimta 500 MG/M2 every 3 weeks, status post 139 cycles.   INTERVAL HISTORY: Alexis [name] 71 y.o. female returns to the clinic today for a follow up visit. The patient is feeling fairly well today with out any concerning complaints. Her dog passed away and she is feeling sad. She is currently undergoing single agent chemotherapy with Alimta.  She tolerates this well.  Denies any fever, chills, night sweats, or unexplained weight loss.  Denies any chest pain, cough, or hemoptysis.  She denies any significant or worsening shortness.  Denies any nausea, vomiting, or constipation. Denies any headache or visual changes.  Denies any rashes or skin changes. She recently had a follow up appointment with her ophthalmologist and urologist. Denies any constipation or unusual diarrhea. She is here today for evaluation before starting cycle 140.     MEDICAL HISTORY: Past Medical History:  Diagnosis Date   Anxiety    Anxiety 06/20/2016   Cervical cancer ([facility])    Diabetes mellitus without complication ([place])    patient states she has type 2   Encounter for antineoplastic chemotherapy 07/20/2015   Malignant neoplasm of  right upper lobe of lung (HCC)     non small cell lung cancer adenocarcioma with brain meta    ALLERGIES:  is allergic to glimepiride and codeine.  MEDICATIONS:  Current Outpatient Medications  Medication Sig Dispense Refill   acetaminophen (TYLENOL) 500 MG tablet Take 500 mg by mouth every 6 (six) hours as needed for mild pain or headache. Reported on 11/02/2015     ALPRAZolam (XANAX) 1 MG tablet Take 0.5 tablets (0.5 mg total) by mouth 2 (two) times daily as needed for anxiety. 30 tablet 2   Ascorbic Acid (VITAMIN C GUMMIE PO) Take 1 each by mouth every morning.     aspirin EC 81 MG tablet Take 1 tablet (81 mg total) by mouth daily. 150 tablet 2   Cranberry 240 MG CAPS Take 1 capsule by mouth daily. This is in her probiotic     [employer] ACID CONTROLLER 10 MG tablet SMARTSIG:1 Tablet(s) By Mouth Every 12 Hours PRN     dexamethasone (DECADRON) 4 MG tablet TAKE 1 TABLET TWICE A DAY THE DAY BEFORE, THE DAY OF, AND THE DAY AFTER CHEMOTHERAPY. 40 tablet 2   fluconazole (DIFLUCAN) 150 MG tablet Take 1 tablet po once. May repeat dose in 3 days as needed for persistent symptoms. 3 tablet 1   folic acid (FOLVITE) 1 MG tablet TAKE 1 TABLET BY MOUTH EVERY DAY 90 tablet 0   glimepiride (AMARYL) 2 MG tablet Take 1 tablet (2 mg total) by mouth daily before breakfast. 30 tablet 2   loratadine (CLARITIN) 10 MG tablet Take 10 mg by mouth  daily.     Multiple Vitamin (MULTIVITAMIN WITH MINERALS) TABS tablet Take 1 tablet by mouth every morning.     omeprazole (PRILOSEC) 20 MG capsule TAKE 1 CAPSULE BY MOUTH EVERY DAY 90 capsule 0   ondansetron (ZOFRAN) 8 MG tablet TAKE 1 TABLET BY MOUTH BEFORE CHEMO 30 tablet 1   OVER THE COUNTER MEDICATION Take 1 tablet by mouth every morning. (Vitamin A)     phenazopyridine (PYRIDIUM) 200 MG tablet Take 1 tablet (200 mg total) by mouth every 8 (eight) hours as needed. 10 tablet 0   prochlorperazine (COMPAZINE) 10 MG tablet Take 1 tablet (10 mg total) by mouth every 6 (six) hours  as needed for nausea or vomiting. 60 tablet 0   rosuvastatin (CRESTOR) 10 MG tablet Take 1 tablet (10 mg total) by mouth daily. 30 tablet 12   senna-docusate (SENOKOT-S) 8.6-50 MG tablet Take 1 tablet by mouth daily. 30 tablet prn   Vitamin D, Ergocalciferol, (DRISDOL) 1.25 MG (50000 UNIT) CAPS capsule Take 50,000 Units by mouth once a week. Vit d 3     No current facility-administered medications for this visit.    SURGICAL HISTORY:  Past Surgical History:  Procedure Laterality Date   ABDOMINAL HYSTERECTOMY     COLOSTOMY TAKEDOWN N/A 07/10/2014   Procedure: LAPAROSCOPIC LYSIS OF ADHESIONS (90 MIN) LAPAROSCOPIC ASSISTED COLOSTOMY CLOSURE, RIGID PROCTOSIGMOIDOSCOPY;  Surgeon: Jackolyn Confer, MD;  Location: [facility];  Service: General;  Laterality: N/A;   LAPAROTOMY N/A 11/03/2013   Procedure: EXPLORATORY LAPAROTOMY, DRAINAGE OF INTRA  ABDOMINAL ABSCESSES, MOBILIZATION OF SPLENIC FLEXURE, SIGMOID COLECTOMY WITH COLOSTOMY;  Surgeon: Odis Hollingshead, MD;  Location: [facility];  Service: General;  Laterality: N/A;   VIDEO BRONCHOSCOPY Bilateral 08/30/2013   Procedure: VIDEO BRONCHOSCOPY WITH FLUORO;  Surgeon: Tanda Rockers, MD;  Location: [facility];  Service: Cardiopulmonary;  Laterality: Bilateral;    REVIEW OF SYSTEMS:   Constitutional: Negative for appetite change, chills, fatigue, fever and unexpected weight change.  HENT: Negative for mouth sores, nosebleeds, sore throat and trouble swallowing.   Eyes: Negative for eye problems and icterus.  Respiratory: Negative for cough, hemoptysis, shortness of breath and wheezing.   Cardiovascular: Negative for chest pain and leg swelling.  Gastrointestinal: Negative for abdominal pain, diarrhea, constipation, nausea and vomiting.  Genitourinary: Negative for bladder incontinence, difficulty urinating, dysuria, frequency and hematuria.   Musculoskeletal: Negative for back pain, gait problem, neck pain and neck stiffness.  Skin: Negative for itching and  rash.  Neurological: Negative for dizziness, extremity weakness, gait problem, headaches, light-headedness and seizures.  Hematological: Negative for adenopathy. Does not bruise/bleed easily.  Psychiatric/Behavioral: Negative for confusion, depression and sleep disturbance. The patient is not nervous/anxious   PHYSICAL EXAMINATION:  Blood pressure 131/72, pulse 98, temperature 98.1 F (36.7 C), temperature source Oral, resp. rate 17, weight 161 lb 14.4 oz (73.4 kg), SpO2 100 %.  ECOG PERFORMANCE STATUS: 1  Physical Exam  Constitutional: Oriented to person, place, and time and well-developed, well-nourished, and in no distress.  HENT:  Head: Normocephalic and atraumatic.  Mouth/Throat: Oropharynx is clear and moist. No oropharyngeal exudate.  Eyes: Conjunctivae are normal. Right eye exhibits no discharge. Left eye exhibits no discharge. No scleral icterus.  Neck: Normal range of motion. Neck supple.  Cardiovascular: Normal rate, regular rhythm, normal heart sounds and intact distal pulses.   Pulmonary/Chest: Effort normal and breath sounds normal. No respiratory distress. No wheezes. No rales.  Abdominal: Soft. Bowel sounds are normal. Exhibits no distension and no mass.  There is no tenderness.  Musculoskeletal: Normal range of motion. Exhibits no edema.  Lymphadenopathy:    No cervical adenopathy.  Neurological: Alert and oriented to person, place, and time. Exhibits normal muscle tone. Gait normal. Coordination normal.  Skin: Skin is warm and dry. No rash noted. Not diaphoretic. No erythema. No pallor.  Psychiatric: Mood, memory and judgment normal.  Vitals reviewed.  LABORATORY DATA: Lab Results  Component Value Date   WBC 7.8 07/18/2022   HGB 11.4 (L) 07/18/2022   HCT 36.1 07/18/2022   MCV 101.7 (H) 07/18/2022   PLT 327 07/18/2022      Chemistry      Component Value Date/Time   NA 137 07/18/2022 0809   NA 137 08/31/2021 0900   NA 139 08/14/2017 0837   K 4.2 07/18/2022  0809   K 4.4 08/14/2017 0837   CL 104 07/18/2022 0809   CO2 24 07/18/2022 0809   CO2 24 08/14/2017 0837   BUN 22 07/18/2022 0809   BUN 24 08/31/2021 0900   BUN 13.3 08/14/2017 0837   CREATININE 0.85 07/18/2022 0809   CREATININE 0.8 08/14/2017 0837      Component Value Date/Time   CALCIUM 10.2 07/18/2022 0809   CALCIUM 9.3 08/14/2017 0837   ALKPHOS 81 07/18/2022 0809   ALKPHOS 107 08/14/2017 0837   AST 10 (L) 07/18/2022 0809   AST 12 08/14/2017 0837   ALT 9 07/18/2022 0809   ALT 12 08/14/2017 0837   BILITOT 0.3 07/18/2022 0809   BILITOT 0.37 08/14/2017 0837       RADIOGRAPHIC STUDIES:  MR Brain W Wo Contrast  Result Date: 07/09/2022 CLINICAL DATA:  Provided history: Brain metastases, assess treatment response. EXAM: MRI HEAD WITHOUT AND WITH CONTRAST TECHNIQUE: Multiplanar, multiecho pulse sequences of the brain and surrounding structures were obtained without and with intravenous contrast. CONTRAST:  7 mL Vueway intravenous contrast. COMPARISON:  Prior brain MRI examinations 01/07/2022 and earlier. FINDINGS: Brain: No age advanced or lobar predominant parenchymal atrophy. 16 mm irregularly enhancing lesion within the right parietal lobe, unchanged in size from the prior brain MRI of 01/07/2022. Unchanged chronic blood products at this site. T2 FLAIR hyperintense signal abnormality within the surrounding cerebral white matter, also stable. No new intracranial metastasis is identified. Moderate multifocal T2 FLAIR hyperintense signal abnormality elsewhere within the cerebral white matter, nonspecific but compatible with chronic small vessel disease. Redemonstrated chronic lacunar infarcts within the left basal ganglia. Tiny chronic lacunar infarct within the left thalamus, new from the prior MRI (series 10, image 31). There is no acute infarct. No extra-axial fluid collection. No midline shift. Vascular: Maintained flow voids within the proximal large arterial vessels. Skull and upper  cervical spine: No focal suspicious marrow lesion. Sinuses/Orbits: No mass or acute finding within the imaged orbits. Prior bilateral ocular lens replacement. Mild mucosal thickening within the anterior ethmoid air cells, bilaterally. Other: Small left mastoid effusion. IMPRESSION: 1. 16 mm treated metastasis within the right parietal lobe, stable in size from the prior brain MRI of 01/07/2022. Surrounding T2 FLAIR hyperintense signal abnormality has also remained stable. 2. No new intracranial metastasis is identified. 3. New tiny chronic lacunar infarct within the left thalamus. 4. Moderate chronic small-vessel ischemic changes are otherwise stable. 5. Mild ethmoid sinus mucosal thickening. 6. Small left mastoid effusion. Electronically Signed   By: Kellie Simmering D.O.   On: 07/09/2022 13:34     ASSESSMENT/PLAN:  This is a very pleasant 71 year old Caucasian female with stage IV non-small cell lung  cancer, adenocarcinoma who presented with a right upper lobe lung mass in addition to a solitary brain metastatsis.  She was diagnosed in January 2015.    The patient had completed induction systemic chemotherapy with carboplatin and Alimta with a partial response.   The patient is currently being treated with single  agent maintenance Alimta.  She is status post 139 cycles.    Labs were reviewed. Recommend that she  proceed with #140 today scheduled.    She will continue to follow with [facility] for her frequent UTIs.   We will see her back for follow-up visit in 3 weeks for evaluation repeat blood work before undergoing cycle #141.  I will order a scan at her last appointment.   The patient was advised to call immediately if she has any concerning symptoms in the interval. The patient voices understanding of current disease status and treatment options and is in agreement with the current care plan. All questions were answered. The patient knows to call the clinic with any problems, questions  or concerns. We can certainly see the patient much sooner if necessary      No orders of the defined types were placed in this encounter.     The total time spent in the appointment was 20-29 minutes.  Andrian Sabala L Domanick Cuccia, PA-C 07/18/22

## 2022-07-12 NOTE — Progress Notes (Signed)
Telephone nursing appointment for Stage IV (T2a, N0, M1b) non-small cell lung cancer of the right upper lobe consistent with adenocarcinoma with brain metastasis at presentation. I verified patient's identity and began nursing interview. Patient reports doing well.   Meaningful use complete. This concludes the nursing interview.   Patient contact [phone]     Leandra Kern, LPN

## 2022-07-12 NOTE — Progress Notes (Signed)
Radiation Oncology         [phone] ________________________________  Outpatient Follow Up- Conducted via telephone at patient request.  I spoke with the patient to conduct this consult visit via telephone. The patient was notified in advance and was offered an in person or telemedicine meeting to allow for face to face communication but instead preferred to proceed with a telephone visit.  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  Prior Radiation:    10/28/2013 through 12/05/2013:   The patient was treated to the right lung tumor to a dose of 50 gray in 25 fractions using a 3-D conformal technique. Daily image guidance was used for the patient's treatment.  10/16/2013 SRS Treatment: PTV1: Rt Parietal 20mm target was treated using 3 Arcs to a prescription dose of 20 Gy. ExacTrac Snap verification was performed for each couch angle.  Narrative:     In summary this is a 71 y.o. patient with a history of metastatic lung cancer to the brain who was treated in two sessions in 2015 for her brain disease, followed by local control to the right lung.  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 She is no longer taking these medications.  She remains on maintenance Alimta with Dr. Julien Nordmann every 3 weeks and has had a total of 139 cycles.   Her last MRI brain on 07/06/2022 showed stable changes in the right parietal lesion that has previously been radiated.  No evidence of growth or new lesions were seen.  Mild vasogenic edema persists but is felt to be again stable.  She continues to have small  mastoid effusion more noted on the left, and sinus thickening in the ethmoids. She's contacted today to review  these results.    ROS: The patient is doing okay. She is not having symptoms of headaches, visual or auditory changes or concerns with movement or dizziness. She is very sad however in the recent passing of her beloved dog Cloyde Reams a few weeks ago.  No other complaints are verbalized.   Past Medical History:  Past Medical History:  Diagnosis Date   Anxiety    Anxiety 06/20/2016   Cervical cancer ([place])    Diabetes mellitus without complication ([place])    patient states she has type 2   Encounter for antineoplastic chemotherapy 07/20/2015   Malignant neoplasm of right upper lobe of lung ([place])     non small cell lung cancer adenocarcioma with brain meta    Past Surgical History: Past Surgical History:  Procedure Laterality Date   ABDOMINAL HYSTERECTOMY     COLOSTOMY TAKEDOWN N/A 07/10/2014   Procedure: LAPAROSCOPIC LYSIS OF ADHESIONS (90 MIN) LAPAROSCOPIC ASSISTED COLOSTOMY CLOSURE, RIGID PROCTOSIGMOIDOSCOPY;  Surgeon: Jackolyn Confer, MD;  Location: [facility];  Service: General;  Laterality: N/A;   LAPAROTOMY N/A 11/03/2013   Procedure: EXPLORATORY LAPAROTOMY, DRAINAGE OF INTRA  ABDOMINAL ABSCESSES, MOBILIZATION OF SPLENIC FLEXURE, SIGMOID COLECTOMY WITH COLOSTOMY;  Surgeon: Odis Hollingshead, MD;  Location: [facility];  Service: General;  Laterality: N/A;   VIDEO BRONCHOSCOPY Bilateral 08/30/2013   Procedure: VIDEO BRONCHOSCOPY WITH FLUORO;  Surgeon: Tanda Rockers, MD;  Location: [facility];  Service: Cardiopulmonary;  Laterality: Bilateral;    Social History:  Social History   Socioeconomic History   Marital status: Married    Spouse name: Not on file   Number of children: Not on file   Years of education: Not on file   Highest education level: Not on file  Occupational History   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  Tobacco Use   Smoking status: Former    Packs/day: 1.00    Years: 40.00    Total pack years: 40.00    Types: Cigarettes    Quit date: 09/27/2013    Years  since quitting: 8.7   Smokeless tobacco: Never  Vaping Use   Vaping Use: Never used  Substance and Sexual Activity   Alcohol use: No   Drug use: No   Sexual activity: Yes  Other Topics Concern   Not on file  Social History Narrative   Not on file   Social Determinants of Health   Financial Resource Strain: Not on file  Food Insecurity: Not on file  Transportation Needs: Not on file  Physical Activity: Not on file  Stress: Not on file  Social Connections: Not on file  Intimate Partner Violence: Not on file  The patient is married. She lives in [place], [place].  Family History: Family History  Problem Relation Age of Onset   Emphysema Father        smoked   Lung cancer Father        smoked   Cancer Mother    Hypertension Mother    COPD Mother     ALLERGIES:  is allergic to glimepiride and codeine.  Meds: Current Outpatient Medications  Medication Sig Dispense Refill   acetaminophen (TYLENOL) 500 MG tablet Take 500 mg by mouth every 6 (six) hours as needed for mild pain or headache. Reported on 11/02/2015     ALPRAZolam (XANAX) 1 MG tablet Take 0.5 tablets (0.5 mg total) by mouth 2 (two) times daily as needed for anxiety. 30 tablet 2   Ascorbic Acid (VITAMIN C GUMMIE PO) Take 1 each by mouth every morning.     aspirin EC 81 MG tablet Take 1 tablet (81 mg total) by mouth daily. 150 tablet 2   Cranberry 240 MG CAPS Take 1 capsule by mouth daily. This is in her probiotic     [employer] ACID CONTROLLER 10 MG tablet SMARTSIG:1 Tablet(s) By Mouth Every 12 Hours PRN     dexamethasone (DECADRON) 4 MG tablet TAKE 1 TABLET TWICE A DAY THE DAY BEFORE, THE DAY OF, AND THE DAY AFTER CHEMOTHERAPY. 40 tablet 2   fluconazole (DIFLUCAN) 150 MG tablet Take 1 tablet po once. May repeat dose in 3 days as needed for persistent symptoms. 3 tablet 1   folic acid (FOLVITE) 1 MG tablet TAKE 1 TABLET BY MOUTH EVERY DAY 90 tablet 0   glimepiride (AMARYL) 2 MG tablet Take 1 tablet (2 mg total) by  mouth daily before breakfast. 30 tablet 2   loratadine (CLARITIN) 10 MG tablet Take 10 mg by mouth daily.     Multiple Vitamin (MULTIVITAMIN WITH MINERALS) TABS tablet Take 1 tablet by mouth every morning.     omeprazole (PRILOSEC) 20 MG capsule TAKE 1 CAPSULE BY MOUTH EVERY DAY 90 capsule 0   ondansetron (ZOFRAN) 8 MG tablet TAKE 1 TABLET BY MOUTH BEFORE CHEMO 30 tablet 1   OVER THE COUNTER MEDICATION Take 1 tablet by mouth every morning. (Vitamin A)     phenazopyridine (PYRIDIUM) 200 MG tablet Take 1 tablet (200 mg total)  by mouth every 8 (eight) hours as needed. 10 tablet 0   prochlorperazine (COMPAZINE) 10 MG tablet Take 1 tablet (10 mg total) by mouth every 6 (six) hours as needed for nausea or vomiting. 60 tablet 0   rosuvastatin (CRESTOR) 10 MG tablet Take 1 tablet (10 mg total) by mouth daily. 30 tablet 12   senna-docusate (SENOKOT-S) 8.6-50 MG tablet Take 1 tablet by mouth daily. 30 tablet prn   Vitamin D, Ergocalciferol, (DRISDOL) 1.25 MG (50000 UNIT) CAPS capsule Take 50,000 Units by mouth once a week. Vit d 3     No current facility-administered medications for this encounter.    Physical Findings: Unable to assess due to type of encounter  Lab Findings: Lab Results  Component Value Date   WBC 9.5 06/27/2022   HGB 11.7 (L) 06/27/2022   HCT 36.8 06/27/2022   MCV 100.0 06/27/2022   PLT 307 06/27/2022     Radiographic Findings: MR Brain W Wo Contrast  Result Date: 07/09/2022 CLINICAL DATA:  Provided history: Brain metastases, assess treatment response. EXAM: MRI HEAD WITHOUT AND WITH CONTRAST TECHNIQUE: Multiplanar, multiecho pulse sequences of the brain and surrounding structures were obtained without and with intravenous contrast. CONTRAST:  7 mL Vueway intravenous contrast. COMPARISON:  Prior brain MRI examinations 01/07/2022 and earlier. FINDINGS: Brain: No age advanced or lobar predominant parenchymal atrophy. 16 mm irregularly enhancing lesion within the right parietal  lobe, unchanged in size from the prior brain MRI of 01/07/2022. Unchanged chronic blood products at this site. T2 FLAIR hyperintense signal abnormality within the surrounding cerebral white matter, also stable. No new intracranial metastasis is identified. Moderate multifocal T2 FLAIR hyperintense signal abnormality elsewhere within the cerebral white matter, nonspecific but compatible with chronic small vessel disease. Redemonstrated chronic lacunar infarcts within the left basal ganglia. Tiny chronic lacunar infarct within the left thalamus, new from the prior MRI (series 10, image 31). There is no acute infarct. No extra-axial fluid collection. No midline shift. Vascular: Maintained flow voids within the proximal large arterial vessels. Skull and upper cervical spine: No focal suspicious marrow lesion. Sinuses/Orbits: No mass or acute finding within the imaged orbits. Prior bilateral ocular lens replacement. Mild mucosal thickening within the anterior ethmoid air cells, bilaterally. Other: Small left mastoid effusion. IMPRESSION: 1. 16 mm treated metastasis within the right parietal lobe, stable in size from the prior brain MRI of 01/07/2022. Surrounding T2 FLAIR hyperintense signal abnormality has also remained stable. 2. No new intracranial metastasis is identified. 3. New tiny chronic lacunar infarct within the left thalamus. 4. Moderate chronic small-vessel ischemic changes are otherwise stable. 5. Mild ethmoid sinus mucosal thickening. 6. Small left mastoid effusion. Electronically Signed   By: Kellie Simmering D.O.   On: 07/09/2022 13:34     Impression/Plan: 1. 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 The patient is doing well radiographically and we discussed the rationale to continue 6 month interval MRIs per NCCN guidelines. She will continue systemic therapy with Dr. Julien Nordmann.      This encounter was  conducted via telephone.  The patient has provided two factor identification and has given verbal consent for this type of encounter and has been advised to only accept a meeting of this type in a secure network environment. The time spent during this encounter was 35 minutes including preparation, discussion, and coordination of the patient's care. The attendants for this meeting include  Rex Kras  Myia Bergh  and [employer] and her husband Merrilyn Legler.  During the encounter, Hayden Pedro was located at [facility] Radiation Oncology Department.  [name] was located at home with her husband Hasna Stefanik.       Carola Rhine, PAC

## 2022-07-13 ENCOUNTER — Other Ambulatory Visit: Payer: Self-pay

## 2022-07-15 MED FILL — Dexamethasone Sodium Phosphate Inj 100 MG/10ML: INTRAMUSCULAR | Qty: 1 | Status: AC

## 2022-07-18 ENCOUNTER — Other Ambulatory Visit: Payer: Self-pay

## 2022-07-18 ENCOUNTER — Inpatient Hospital Stay (HOSPITAL_BASED_OUTPATIENT_CLINIC_OR_DEPARTMENT_OTHER): Payer: Medicare Other | Admitting: Physician Assistant

## 2022-07-18 ENCOUNTER — Inpatient Hospital Stay: Payer: Medicare Other | Attending: Internal Medicine

## 2022-07-18 ENCOUNTER — Inpatient Hospital Stay: Payer: Medicare Other

## 2022-07-18 VITALS — BP 109/55 | HR 95 | Resp 18

## 2022-07-18 VITALS — BP 131/72 | HR 98 | Temp 98.1°F | Resp 17 | Wt 161.9 lb

## 2022-07-18 DIAGNOSIS — C3411 Malignant neoplasm of upper lobe, right bronchus or lung: Secondary | ICD-10-CM | POA: Diagnosis not present

## 2022-07-18 DIAGNOSIS — Z79899 Other long term (current) drug therapy: Secondary | ICD-10-CM | POA: Diagnosis not present

## 2022-07-18 DIAGNOSIS — C7931 Secondary malignant neoplasm of brain: Secondary | ICD-10-CM | POA: Insufficient documentation

## 2022-07-18 DIAGNOSIS — Z5112 Encounter for antineoplastic immunotherapy: Secondary | ICD-10-CM | POA: Diagnosis not present

## 2022-07-18 DIAGNOSIS — Z923 Personal history of irradiation: Secondary | ICD-10-CM | POA: Insufficient documentation

## 2022-07-18 DIAGNOSIS — Z9221 Personal history of antineoplastic chemotherapy: Secondary | ICD-10-CM | POA: Diagnosis not present

## 2022-07-18 DIAGNOSIS — Z5111 Encounter for antineoplastic chemotherapy: Secondary | ICD-10-CM

## 2022-07-18 LAB — CBC WITH DIFFERENTIAL (CANCER CENTER ONLY)
Abs Immature Granulocytes: 0.05 10*3/uL (ref 0.00–0.07)
Basophils Absolute: 0 10*3/uL (ref 0.0–0.1)
Basophils Relative: 0 %
Eosinophils Absolute: 0 10*3/uL (ref 0.0–0.5)
Eosinophils Relative: 0 %
HCT: 36.1 % (ref 36.0–46.0)
Hemoglobin: 11.4 g/dL — ABNORMAL LOW (ref 12.0–15.0)
Immature Granulocytes: 1 %
Lymphocytes Relative: 6 %
Lymphs Abs: 0.5 10*3/uL — ABNORMAL LOW (ref 0.7–4.0)
MCH: 32.1 pg (ref 26.0–34.0)
MCHC: 31.6 g/dL (ref 30.0–36.0)
MCV: 101.7 fL — ABNORMAL HIGH (ref 80.0–100.0)
Monocytes Absolute: 0.4 10*3/uL (ref 0.1–1.0)
Monocytes Relative: 5 %
Neutro Abs: 6.9 10*3/uL (ref 1.7–7.7)
Neutrophils Relative %: 88 %
Platelet Count: 327 10*3/uL (ref 150–400)
RBC: 3.55 MIL/uL — ABNORMAL LOW (ref 3.87–5.11)
RDW: 15.5 % (ref 11.5–15.5)
WBC Count: 7.8 10*3/uL (ref 4.0–10.5)
nRBC: 0 % (ref 0.0–0.2)

## 2022-07-18 LAB — CMP (CANCER CENTER ONLY)
ALT: 9 U/L (ref 0–44)
AST: 10 U/L — ABNORMAL LOW (ref 15–41)
Albumin: 4.1 g/dL (ref 3.5–5.0)
Alkaline Phosphatase: 81 U/L (ref 38–126)
Anion gap: 9 (ref 5–15)
BUN: 22 mg/dL (ref 8–23)
CO2: 24 mmol/L (ref 22–32)
Calcium: 10.2 mg/dL (ref 8.9–10.3)
Chloride: 104 mmol/L (ref 98–111)
Creatinine: 0.85 mg/dL (ref 0.44–1.00)
GFR, Estimated: >60 mL/min (ref >60)
Glucose, Bld: 191 mg/dL — ABNORMAL HIGH (ref 70–99)
Potassium: 4.2 mmol/L (ref 3.5–5.1)
Sodium: 137 mmol/L (ref 135–145)
Total Bilirubin: 0.3 mg/dL (ref 0.3–1.2)
Total Protein: 7.3 g/dL (ref 6.5–8.1)

## 2022-07-18 MED ORDER — SODIUM CHLORIDE 0.9 % IV SOLN
500.0000 mg/m2 | Freq: Once | INTRAVENOUS | Status: AC
  Administered 2022-07-18: 900 mg via INTRAVENOUS
  Filled 2022-07-18: qty 20

## 2022-07-18 MED ORDER — SODIUM CHLORIDE 0.9 % IV SOLN: Freq: Once | INTRAVENOUS | Status: AC

## 2022-07-18 MED ORDER — SODIUM CHLORIDE 0.9 % IV SOLN
10.0000 mg | Freq: Once | INTRAVENOUS | Status: AC
  Administered 2022-07-18: 10 mg via INTRAVENOUS
  Filled 2022-07-18: qty 10

## 2022-07-18 NOTE — Patient Instructions (Signed)
[facility]  Discharge Instructions: Thank you for choosing [facility] to provide your oncology and hematology care.   If you have a lab appointment with the [facility], please go directly to the [facility] and check in at the registration area.   Wear comfortable clothing and clothing appropriate for easy access to any Portacath or PICC line.   We strive to give you quality time with your provider. You may need to reschedule your appointment if you arrive late (15 or more minutes).  Arriving late affects you and other patients whose appointments are after yours.  Also, if you miss three or more appointments without notifying the office, you may be dismissed from the clinic at the provider's discretion.      For prescription refill requests, have your pharmacy contact our office and allow 72 hours for refills to be completed.    Today you received the following chemotherapy and/or immunotherapy agents: Alimta      To help prevent nausea and vomiting after your treatment, we encourage you to take your nausea medication as directed.  BELOW ARE SYMPTOMS THAT SHOULD BE REPORTED IMMEDIATELY: *FEVER GREATER THAN 100.4 F (38 C) OR HIGHER *CHILLS OR SWEATING *NAUSEA AND VOMITING THAT IS NOT CONTROLLED WITH YOUR NAUSEA MEDICATION *UNUSUAL SHORTNESS OF BREATH *UNUSUAL BRUISING OR BLEEDING *URINARY PROBLEMS (pain or burning when urinating, or frequent urination) *BOWEL PROBLEMS (unusual diarrhea, constipation, pain near the anus) TENDERNESS IN MOUTH AND THROAT WITH OR WITHOUT PRESENCE OF ULCERS (sore throat, sores in mouth, or a toothache) UNUSUAL RASH, SWELLING OR PAIN  UNUSUAL VAGINAL DISCHARGE OR ITCHING   Items with * indicate a potential emergency and should be followed up as soon as possible or go to the Emergency Department if any problems should occur.  Please show the CHEMOTHERAPY ALERT CARD or IMMUNOTHERAPY ALERT CARD at check-in to the  Emergency Department and triage nurse.  Should you have questions after your visit or need to cancel or reschedule your appointment, please contact [facility]  Dept: [phone]  and follow the prompts.  Office hours are 8:00 a.m. to 4:30 p.m. Monday - Friday. Please note that voicemails left after 4:00 p.m. may not be returned until the following business day.  We are closed weekends and major holidays. You have access to a nurse at all times for urgent questions. Please call the main number to the clinic Dept: [phone] and follow the prompts.   For any non-urgent questions, you may also contact your provider using MyChart. We now offer e-Visits for anyone 48 and older to request care online for non-urgent symptoms. For details visit [email or web address].   Also download the MyChart app! Go to the app store, search "MyChart", open the app, select [facility], and log in with your MyChart username and password.  Masks are optional in the cancer centers. If you would like for your care team to wear a mask while they are taking care of you, please let them know. You may have one support person who is at least 70 years old accompany you for your appointments.

## 2022-07-23 ENCOUNTER — Other Ambulatory Visit: Payer: Self-pay | Admitting: Nurse Practitioner

## 2022-07-23 DIAGNOSIS — F411 Generalized anxiety disorder: Secondary | ICD-10-CM

## 2022-08-05 MED FILL — Dexamethasone Sodium Phosphate Inj 100 MG/10ML: INTRAMUSCULAR | Qty: 1 | Status: AC

## 2022-08-08 ENCOUNTER — Inpatient Hospital Stay: Payer: Medicare Other

## 2022-08-08 ENCOUNTER — Other Ambulatory Visit: Payer: Self-pay

## 2022-08-08 ENCOUNTER — Inpatient Hospital Stay: Payer: Medicare Other | Attending: Internal Medicine | Admitting: Internal Medicine

## 2022-08-08 VITALS — BP 134/59 | HR 91 | Temp 97.0°F | Resp 18 | Wt 160.6 lb

## 2022-08-08 DIAGNOSIS — E119 Type 2 diabetes mellitus without complications: Secondary | ICD-10-CM | POA: Diagnosis not present

## 2022-08-08 DIAGNOSIS — Z7984 Long term (current) use of oral hypoglycemic drugs: Secondary | ICD-10-CM | POA: Insufficient documentation

## 2022-08-08 DIAGNOSIS — C7931 Secondary malignant neoplasm of brain: Secondary | ICD-10-CM | POA: Insufficient documentation

## 2022-08-08 DIAGNOSIS — C3411 Malignant neoplasm of upper lobe, right bronchus or lung: Secondary | ICD-10-CM | POA: Insufficient documentation

## 2022-08-08 DIAGNOSIS — C3490 Malignant neoplasm of unspecified part of unspecified bronchus or lung: Secondary | ICD-10-CM

## 2022-08-08 DIAGNOSIS — Z5111 Encounter for antineoplastic chemotherapy: Secondary | ICD-10-CM | POA: Diagnosis not present

## 2022-08-08 DIAGNOSIS — Z7982 Long term (current) use of aspirin: Secondary | ICD-10-CM | POA: Insufficient documentation

## 2022-08-08 DIAGNOSIS — F419 Anxiety disorder, unspecified: Secondary | ICD-10-CM | POA: Insufficient documentation

## 2022-08-08 DIAGNOSIS — Z79899 Other long term (current) drug therapy: Secondary | ICD-10-CM | POA: Insufficient documentation

## 2022-08-08 LAB — CMP (CANCER CENTER ONLY)
ALT: 9 U/L (ref 0–44)
AST: 11 U/L — ABNORMAL LOW (ref 15–41)
Albumin: 4 g/dL (ref 3.5–5.0)
Alkaline Phosphatase: 92 U/L (ref 38–126)
Anion gap: 9 (ref 5–15)
BUN: 19 mg/dL (ref 8–23)
CO2: 27 mmol/L (ref 22–32)
Calcium: 10.1 mg/dL (ref 8.9–10.3)
Chloride: 104 mmol/L (ref 98–111)
Creatinine: 0.88 mg/dL (ref 0.44–1.00)
GFR, Estimated: >60 mL/min (ref >60)
Glucose, Bld: 151 mg/dL — ABNORMAL HIGH (ref 70–99)
Potassium: 4.3 mmol/L (ref 3.5–5.1)
Sodium: 140 mmol/L (ref 135–145)
Total Bilirubin: 0.4 mg/dL (ref 0.3–1.2)
Total Protein: 7 g/dL (ref 6.5–8.1)

## 2022-08-08 LAB — CBC WITH DIFFERENTIAL (CANCER CENTER ONLY)
Abs Immature Granulocytes: 0.14 10*3/uL — ABNORMAL HIGH (ref 0.00–0.07)
Basophils Absolute: 0 10*3/uL (ref 0.0–0.1)
Basophils Relative: 0 %
Eosinophils Absolute: 0 10*3/uL (ref 0.0–0.5)
Eosinophils Relative: 0 %
HCT: 37.3 % (ref 36.0–46.0)
Hemoglobin: 12.1 g/dL (ref 12.0–15.0)
Immature Granulocytes: 1 %
Lymphocytes Relative: 5 %
Lymphs Abs: 0.7 10*3/uL (ref 0.7–4.0)
MCH: 32.3 pg (ref 26.0–34.0)
MCHC: 32.4 g/dL (ref 30.0–36.0)
MCV: 99.5 fL (ref 80.0–100.0)
Monocytes Absolute: 1 10*3/uL (ref 0.1–1.0)
Monocytes Relative: 7 %
Neutro Abs: 11.8 10*3/uL — ABNORMAL HIGH (ref 1.7–7.7)
Neutrophils Relative %: 87 %
Platelet Count: 362 10*3/uL (ref 150–400)
RBC: 3.75 MIL/uL — ABNORMAL LOW (ref 3.87–5.11)
RDW: 15.7 % — ABNORMAL HIGH (ref 11.5–15.5)
WBC Count: 13.7 10*3/uL — ABNORMAL HIGH (ref 4.0–10.5)
nRBC: 0 % (ref 0.0–0.2)

## 2022-08-08 MED ORDER — SODIUM CHLORIDE 0.9 % IV SOLN: Freq: Once | INTRAVENOUS | Status: AC

## 2022-08-08 MED ORDER — SODIUM CHLORIDE 0.9 % IV SOLN
10.0000 mg | Freq: Once | INTRAVENOUS | Status: AC
  Administered 2022-08-08: 10 mg via INTRAVENOUS
  Filled 2022-08-08: qty 10

## 2022-08-08 MED ORDER — SODIUM CHLORIDE 0.9 % IV SOLN
500.0000 mg/m2 | Freq: Once | INTRAVENOUS | Status: AC
  Administered 2022-08-08: 900 mg via INTRAVENOUS
  Filled 2022-08-08: qty 16

## 2022-08-08 NOTE — Progress Notes (Signed)
[facility] [phone]   [phone]  OFFICE PROGRESS NOTE  [name], NP [place] [address]  DIAGNOSIS: Stage IV (T2a, N0, M1b) non-small cell lung cancer, adenocarcinoma with negative EGFR and ALK mutations diagnosed in January 2015 and presented with right upper lobe lung mass in addition to a solitary brain metastasis.  PRIOR THERAPY: 1) Status post stereotactic radiotherapy to a solitary right parietal brain lesion under the care of Dr. [name] on 10/16/2013. 2) Status post palliative radiotherapy to the right lung tumor under the care of Dr. [name] completed on 12/05/2013. 3) Systemic chemotherapy with carboplatin for AUC of 5 and Alimta 500 mg/M2 every 3 weeks. First dose Jan 06 2014. Status post 6 cycles.  CURRENT THERAPY: Systemic chemotherapy with maintenance Alimta 500 MG/M2 every 3 weeks, status post 140 cycles.  INTERVAL HISTORY: Alexis [name] 71 y.o. female returns to the clinic today for follow-up visit accompanied by her husband.  The patient is feeling fine today with no concerning complaints.  She denied having any chest pain, shortness of breath, cough or hemoptysis.  She has no nausea, vomiting, diarrhea or constipation.  She has no headache or visual changes.  She denied having any weight loss or night sweats.  She is here today for evaluation before starting cycle #141 of her treatment.    MEDICAL HISTORY: Past Medical History:  Diagnosis Date   Anxiety    Anxiety 06/20/2016   Cervical cancer ([place])    Diabetes mellitus without complication ([facility])    patient states she has type 2   Encounter for antineoplastic chemotherapy 07/20/2015   Malignant neoplasm of right upper lobe of lung ([facility])     non small cell lung cancer adenocarcioma with brain meta    ALLERGIES:  is allergic to glimepiride and codeine.  MEDICATIONS:  Current Outpatient Medications  Medication Sig Dispense Refill    acetaminophen (TYLENOL) 500 MG tablet Take 500 mg by mouth every 6 (six) hours as needed for mild pain or headache. Reported on 11/02/2015     ALPRAZolam (XANAX) 1 MG tablet TAKE 1/2 A TABLET (0.5 MG TOTAL) BY MOUTH 2 (TWO) TIMES DAILY AS NEEDED FOR ANXIETY. 30 tablet 2   Ascorbic Acid (VITAMIN C GUMMIE PO) Take 1 each by mouth every morning.     aspirin EC 81 MG tablet Take 1 tablet (81 mg total) by mouth daily. 150 tablet 2   Cranberry 240 MG CAPS Take 1 capsule by mouth daily. This is in her probiotic     [employer] ACID CONTROLLER 10 MG tablet SMARTSIG:1 Tablet(s) By Mouth Every 12 Hours PRN     dexamethasone (DECADRON) 4 MG tablet TAKE 1 TABLET TWICE A DAY THE DAY BEFORE, THE DAY OF, AND THE DAY AFTER CHEMOTHERAPY. 40 tablet 2   fluconazole (DIFLUCAN) 150 MG tablet Take 1 tablet po once. May repeat dose in 3 days as needed for persistent symptoms. 3 tablet 1   folic acid (FOLVITE) 1 MG tablet TAKE 1 TABLET BY MOUTH EVERY DAY 90 tablet 0   glimepiride (AMARYL) 2 MG tablet Take 1 tablet (2 mg total) by mouth daily before breakfast. 30 tablet 2   loratadine (CLARITIN) 10 MG tablet Take 10 mg by mouth daily.     Multiple Vitamin (MULTIVITAMIN WITH MINERALS) TABS tablet Take 1 tablet by mouth every morning.     omeprazole (PRILOSEC) 20 MG capsule TAKE 1 CAPSULE BY MOUTH EVERY  DAY 90 capsule 0   ondansetron (ZOFRAN) 8 MG tablet TAKE 1 TABLET BY MOUTH BEFORE CHEMO 30 tablet 1   OVER THE COUNTER MEDICATION Take 1 tablet by mouth every morning. (Vitamin A)     phenazopyridine (PYRIDIUM) 200 MG tablet Take 1 tablet (200 mg total) by mouth every 8 (eight) hours as needed. 10 tablet 0   prochlorperazine (COMPAZINE) 10 MG tablet Take 1 tablet (10 mg total) by mouth every 6 (six) hours as needed for nausea or vomiting. 60 tablet 0   rosuvastatin (CRESTOR) 10 MG tablet Take 1 tablet (10 mg total) by mouth daily. 30 tablet 12   senna-docusate (SENOKOT-S) 8.6-50 MG tablet Take 1 tablet by mouth daily. 30 tablet prn    Vitamin D, Ergocalciferol, (DRISDOL) 1.25 MG (50000 UNIT) CAPS capsule Take 50,000 Units by mouth once a week. Vit d 3     No current facility-administered medications for this visit.    SURGICAL HISTORY:  Past Surgical History:  Procedure Laterality Date   ABDOMINAL HYSTERECTOMY     COLOSTOMY TAKEDOWN N/A 07/10/2014   Procedure: LAPAROSCOPIC LYSIS OF ADHESIONS (90 MIN) LAPAROSCOPIC ASSISTED COLOSTOMY CLOSURE, RIGID PROCTOSIGMOIDOSCOPY;  Surgeon: Jackolyn Confer, MD;  Location: [facility];  Service: General;  Laterality: N/A;   LAPAROTOMY N/A 11/03/2013   Procedure: EXPLORATORY LAPAROTOMY, DRAINAGE OF INTRA  ABDOMINAL ABSCESSES, MOBILIZATION OF SPLENIC FLEXURE, SIGMOID COLECTOMY WITH COLOSTOMY;  Surgeon: Odis Hollingshead, MD;  Location: [facility];  Service: General;  Laterality: N/A;   VIDEO BRONCHOSCOPY Bilateral 08/30/2013   Procedure: VIDEO BRONCHOSCOPY WITH FLUORO;  Surgeon: Tanda Rockers, MD;  Location: [facility];  Service: Cardiopulmonary;  Laterality: Bilateral;    REVIEW OF SYSTEMS:  A comprehensive review of systems was negative.   PHYSICAL EXAMINATION: General appearance: alert, cooperative, and no distress Head: Normocephalic, without obvious abnormality, atraumatic Neck: no adenopathy, no JVD, supple, symmetrical, trachea midline, and thyroid not enlarged, symmetric, no tenderness/mass/nodules Lymph nodes: Cervical, supraclavicular, and axillary nodes normal. Resp: clear to auscultation bilaterally Back: symmetric, no curvature. ROM normal. No CVA tenderness. Cardio: regular rate and rhythm, S1, S2 normal, no murmur, click, rub or gallop GI: soft, non-tender; bowel sounds normal; no masses,  no organomegaly Extremities: extremities normal, atraumatic, no cyanosis or edema   ECOG PERFORMANCE STATUS: 1 - Symptomatic but completely ambulatory   Blood pressure (!) 134/59, pulse 91, temperature (!) 97 F (36.1 C), temperature source Oral, resp. rate 18, weight 160 lb 9 oz (72.8 kg),  SpO2 97 %.  LABORATORY DATA: Lab Results  Component Value Date   WBC 13.7 (H) 08/08/2022   HGB 12.1 08/08/2022   HCT 37.3 08/08/2022   MCV 99.5 08/08/2022   PLT 362 08/08/2022      Chemistry      Component Value Date/Time   NA 140 08/08/2022 0918   NA 137 08/31/2021 0900   NA 139 08/14/2017 0837   K 4.3 08/08/2022 0918   K 4.4 08/14/2017 0837   CL 104 08/08/2022 0918   CO2 27 08/08/2022 0918   CO2 24 08/14/2017 0837   BUN 19 08/08/2022 0918   BUN 24 08/31/2021 0900   BUN 13.3 08/14/2017 0837   CREATININE 0.88 08/08/2022 0918   CREATININE 0.8 08/14/2017 0837      Component Value Date/Time   CALCIUM 10.1 08/08/2022 0918   CALCIUM 9.3 08/14/2017 0837   ALKPHOS 92 08/08/2022 0918   ALKPHOS 107 08/14/2017 0837   AST 11 (L) 08/08/2022 0918   AST 12 08/14/2017 0837  ALT 9 08/08/2022 0918   ALT 12 08/14/2017 0837   BILITOT 0.4 08/08/2022 0918   BILITOT 0.37 08/14/2017 0837       RADIOGRAPHIC STUDIES: No results found.  ASSESSMENT AND PLAN:  This is a very pleasant 71 years old white female with metastatic non-small cell lung cancer, adenocarcinoma status post induction systemic chemotherapy with carboplatin and Alimta with partial response.  The patient has no actionable EGFR or ALK mutations at the time of her diagnosis. The patient is currently on maintenance treatment with single agent Alimta status post 140 cycles. The patient has been tolerating this treatment well with no concerning adverse effects. I recommended for her to proceed with cycle #141 today as planned. I will see her back for follow-up visit in 3 weeks for evaluation with repeat CT scan of the chest, abdomen and pelvis for restaging of her disease. For the anxiety she is currently on Xanax. She was advised to call immediately if she has any other concerning symptoms in the interval. The patient voices understanding of current disease status and treatment options and is in agreement with the current  care plan. All questions were answered. The patient knows to call the clinic with any problems, questions or concerns. We can certainly see the patient much sooner if necessary.  Disclaimer: This note was dictated with voice recognition software. Similar sounding words can inadvertently be transcribed and may not be corrected upon review.

## 2022-08-08 NOTE — Patient Instructions (Signed)
[facility]  Discharge Instructions: Thank you for choosing [facility] to provide your oncology and hematology care.   If you have a lab appointment with the [facility], please go directly to the [facility] and check in at the registration area.   Wear comfortable clothing and clothing appropriate for easy access to any Portacath or PICC line.   We strive to give you quality time with your provider. You may need to reschedule your appointment if you arrive late (15 or more minutes).  Arriving late affects you and other patients whose appointments are after yours.  Also, if you miss three or more appointments without notifying the office, you may be dismissed from the clinic at the provider's discretion.      For prescription refill requests, have your pharmacy contact our office and allow 72 hours for refills to be completed.    Today you received the following chemotherapy and/or immunotherapy agents: Alimta      To help prevent nausea and vomiting after your treatment, we encourage you to take your nausea medication as directed.  BELOW ARE SYMPTOMS THAT SHOULD BE REPORTED IMMEDIATELY: *FEVER GREATER THAN 100.4 F (38 C) OR HIGHER *CHILLS OR SWEATING *NAUSEA AND VOMITING THAT IS NOT CONTROLLED WITH YOUR NAUSEA MEDICATION *UNUSUAL SHORTNESS OF BREATH *UNUSUAL BRUISING OR BLEEDING *URINARY PROBLEMS (pain or burning when urinating, or frequent urination) *BOWEL PROBLEMS (unusual diarrhea, constipation, pain near the anus) TENDERNESS IN MOUTH AND THROAT WITH OR WITHOUT PRESENCE OF ULCERS (sore throat, sores in mouth, or a toothache) UNUSUAL RASH, SWELLING OR PAIN  UNUSUAL VAGINAL DISCHARGE OR ITCHING   Items with * indicate a potential emergency and should be followed up as soon as possible or go to the Emergency Department if any problems should occur.  Please show the CHEMOTHERAPY ALERT CARD or IMMUNOTHERAPY ALERT CARD at check-in to the  Emergency Department and triage nurse.  Should you have questions after your visit or need to cancel or reschedule your appointment, please contact [facility]  Dept: [phone]  and follow the prompts.  Office hours are 8:00 a.m. to 4:30 p.m. Monday - Friday. Please note that voicemails left after 4:00 p.m. may not be returned until the following business day.  We are closed weekends and major holidays. You have access to a nurse at all times for urgent questions. Please call the main number to the clinic Dept: [phone] and follow the prompts.   For any non-urgent questions, you may also contact your provider using MyChart. We now offer e-Visits for anyone 94 and older to request care online for non-urgent symptoms. For details visit [email or web address].   Also download the MyChart app! Go to the app store, search "MyChart", open the app, select [facility], and log in with your MyChart username and password.  Masks are optional in the cancer centers. If you would like for your care team to wear a mask while they are taking care of you, please let them know. You may have one support person who is at least 70 years old accompany you for your appointments.

## 2022-08-11 ENCOUNTER — Ambulatory Visit (INDEPENDENT_AMBULATORY_CARE_PROVIDER_SITE_OTHER): Payer: Medicare Other | Admitting: Nurse Practitioner

## 2022-08-11 ENCOUNTER — Encounter: Payer: Self-pay | Admitting: Nurse Practitioner

## 2022-08-11 VITALS — BP 112/70 | HR 76 | Ht 64.5 in | Wt 167.7 lb

## 2022-08-11 DIAGNOSIS — C3411 Malignant neoplasm of upper lobe, right bronchus or lung: Secondary | ICD-10-CM | POA: Diagnosis not present

## 2022-08-11 DIAGNOSIS — E1169 Type 2 diabetes mellitus with other specified complication: Secondary | ICD-10-CM

## 2022-08-11 DIAGNOSIS — F411 Generalized anxiety disorder: Secondary | ICD-10-CM | POA: Diagnosis not present

## 2022-08-11 DIAGNOSIS — E785 Hyperlipidemia, unspecified: Secondary | ICD-10-CM | POA: Diagnosis not present

## 2022-08-11 DIAGNOSIS — E119 Type 2 diabetes mellitus without complications: Secondary | ICD-10-CM | POA: Diagnosis not present

## 2022-08-11 LAB — POCT GLYCOSYLATED HEMOGLOBIN (HGB A1C): HbA1c POC (<> result, manual entry): 6.3 % (ref 4.0–5.6)

## 2022-08-11 NOTE — Progress Notes (Signed)
Established patient visit   Patient: Alexis [name]   DOB: [birth year]   71 y.o. Female  MRN: [id number] Visit Date: 08/11/2022   Chief Complaint  Patient presents with   Follow-up   Subjective    HPI  Follow up  DM2 -has been off diabetic medication since 06/15/2022 -blood sugar log shows that glucose is generally between 100 and 130 everyday.  -she did send MyChart message with blood sugars after medication as initially stopped.  --few high numbers, but generally good.  -dHgbA1c is 6.3 today    Medications: Outpatient Medications Prior to Visit  Medication Sig   acetaminophen (TYLENOL) 500 MG tablet Take 500 mg by mouth every 6 (six) hours as needed for mild pain or headache. Reported on 11/02/2015   ALPRAZolam (XANAX) 1 MG tablet TAKE 1/2 A TABLET (0.5 MG TOTAL) BY MOUTH 2 (TWO) TIMES DAILY AS NEEDED FOR ANXIETY.   Ascorbic Acid (VITAMIN C GUMMIE PO) Take 1 each by mouth every morning.   aspirin EC 81 MG tablet Take 1 tablet (81 mg total) by mouth daily.   Cranberry 240 MG CAPS Take 1 capsule by mouth daily. This is in her probiotic   [employer] ACID CONTROLLER 10 MG tablet SMARTSIG:1 Tablet(s) By Mouth Every 12 Hours PRN   dexamethasone (DECADRON) 4 MG tablet TAKE 1 TABLET TWICE A DAY THE DAY BEFORE, THE DAY OF, AND THE DAY AFTER CHEMOTHERAPY.   fluconazole (DIFLUCAN) 150 MG tablet Take 1 tablet po once. May repeat dose in 3 days as needed for persistent symptoms.   loratadine (CLARITIN) 10 MG tablet Take 10 mg by mouth daily.   Multiple Vitamin (MULTIVITAMIN WITH MINERALS) TABS tablet Take 1 tablet by mouth every morning.   ondansetron (ZOFRAN) 8 MG tablet TAKE 1 TABLET BY MOUTH BEFORE CHEMO   OVER THE COUNTER MEDICATION Take 1 tablet by mouth every morning. (Vitamin A)   phenazopyridine (PYRIDIUM) 200 MG tablet Take 1 tablet (200 mg total) by mouth every 8 (eight) hours as needed.   prochlorperazine (COMPAZINE) 10 MG tablet Take 1 tablet (10 mg total) by mouth every 6 (six) hours as  needed for nausea or vomiting.   rosuvastatin (CRESTOR) 10 MG tablet Take 1 tablet (10 mg total) by mouth daily.   senna-docusate (SENOKOT-S) 8.6-50 MG tablet Take 1 tablet by mouth daily.   Vitamin D, Ergocalciferol, (DRISDOL) 1.25 MG (50000 UNIT) CAPS capsule Take 50,000 Units by mouth once a week. Vit d 3   [DISCONTINUED] folic acid (FOLVITE) 1 MG tablet TAKE 1 TABLET BY MOUTH EVERY DAY   [DISCONTINUED] glimepiride (AMARYL) 2 MG tablet Take 1 tablet (2 mg total) by mouth daily before breakfast.   [DISCONTINUED] omeprazole (PRILOSEC) 20 MG capsule TAKE 1 CAPSULE BY MOUTH EVERY DAY   No facility-administered medications prior to visit.    Review of Systems  Constitutional:  Negative for activity change, appetite change, chills, fatigue and fever.  HENT:  Negative for congestion, postnasal drip, rhinorrhea, sinus pressure, sinus pain, sneezing and sore throat.   Eyes: Negative.   Respiratory:  Negative for cough, chest tightness, shortness of breath and wheezing.   Cardiovascular:  Negative for chest pain and palpitations.  Gastrointestinal:  Negative for abdominal pain, constipation, diarrhea, nausea and vomiting.  Endocrine: Negative for cold intolerance, heat intolerance, polydipsia and polyuria.       Has been off all diabetic medication since visit 06/15/2022.   Genitourinary:  Negative for dyspareunia, dysuria, flank pain, frequency and urgency.  Musculoskeletal:  Negative  for arthralgias, back pain and myalgias.  Skin:  Negative for rash.  Allergic/Immunologic: Negative for environmental allergies.       Allergic reaction to starting glimepiride.   Neurological:  Negative for dizziness, weakness and headaches.  Hematological:  Negative for adenopathy.  Psychiatric/Behavioral:  Positive for sleep disturbance. The patient is nervous/anxious.     Last CBC Lab Results  Component Value Date   WBC 13.7 (H) 08/08/2022   HGB 12.1 08/08/2022   HCT 37.3 08/08/2022   MCV 99.5  08/08/2022   MCH 32.3 08/08/2022   RDW 15.7 (H) 08/08/2022   PLT 362 09/60/4540   Last metabolic panel Lab Results  Component Value Date   GLUCOSE 151 (H) 08/08/2022   NA 140 08/08/2022   K 4.3 08/08/2022   CL 104 08/08/2022   CO2 27 08/08/2022   BUN 19 08/08/2022   CREATININE 0.88 08/08/2022   GFRNONAA >60 08/08/2022   CALCIUM 10.1 08/08/2022   PHOS 3.9 11/16/2013   PROT 7.0 08/08/2022   ALBUMIN 4.0 08/08/2022   LABGLOB 2.5 08/31/2021   AGRATIO 1.5 08/31/2021   BILITOT 0.4 08/08/2022   ALKPHOS 92 08/08/2022   AST 11 (L) 08/08/2022   ALT 9 08/08/2022   ANIONGAP 9 08/08/2022   Last lipids Lab Results  Component Value Date   CHOL 164 08/31/2021   HDL 60 08/31/2021   LDLCALC 83 08/31/2021   TRIG 116 08/31/2021   CHOLHDL 2.7 08/31/2021   Last hemoglobin A1c Lab Results  Component Value Date   HGBA1C 6.3 08/11/2022   Last thyroid functions Lab Results  Component Value Date   TSH 1.710 08/31/2021       Objective     Today's Vitals   08/11/22 1422  BP: 112/70  Pulse: 76  SpO2: 100%  Weight: 167 lb 11.2 oz (76.1 kg)   Body mass index is 28.34 kg/m.  BP Readings from Last 3 Encounters:  08/11/22 112/70  08/08/22 (Abnormal) 134/59  07/18/22 (Abnormal) 109/55    Wt Readings from Last 3 Encounters:  08/11/22 167 lb 11.2 oz (76.1 kg)  08/08/22 160 lb 9 oz (72.8 kg)  07/18/22 161 lb 14.4 oz (73.4 kg)    Physical Exam Vitals and nursing note reviewed.  Constitutional:      Appearance: Normal appearance. She is well-developed.  HENT:     Head: Normocephalic and atraumatic.  Eyes:     Pupils: Pupils are equal, round, and reactive to light.  Cardiovascular:     Rate and Rhythm: Normal rate and regular rhythm.     Pulses: Normal pulses.     Heart sounds: Normal heart sounds.  Pulmonary:     Effort: Pulmonary effort is normal.     Breath sounds: Normal breath sounds.  Abdominal:     Palpations: Abdomen is soft.  Musculoskeletal:        General:  Normal range of motion.     Cervical back: Normal range of motion and neck supple.  Lymphadenopathy:     Cervical: No cervical adenopathy.  Skin:    General: Skin is warm and dry.     Capillary Refill: Capillary refill takes less than 2 seconds.  Neurological:     General: No focal deficit present.     Mental Status: She is alert and oriented to person, place, and time.  Psychiatric:        Mood and Affect: Mood normal.        Behavior: Behavior normal.  Thought Content: Thought content normal.        Judgment: Judgment normal.    Results for orders placed or performed in visit on 08/11/22  POCT glycosylated hemoglobin (Hb A1C)  Result Value Ref Range   Hemoglobin A1C     HbA1c POC (<> result, manual entry) 6.3 4.0 - 5.6 %   HbA1c, POC (prediabetic range)     HbA1c, POC (controlled diabetic range)      Assessment & Plan    1. Diabetes mellitus without complication (HCC) [id number] 6.3 today. No new medication started today. Recommend she continue to control with diet. Reassess HgbA1c in three months. - POCT glycosylated hemoglobin (Hb A1C)  2. Hyperlipidemia due to type 2 diabetes mellitus ([facility]) Continue crestor as prescribed   3. Generalized anxiety disorder May take alprazolam as needed and as prescribed   4. Primary cancer of right upper lobe of lung ([place]) Continue regular visits with oncology as scheduled.     Problem List Items Addressed This Visit       Respiratory   Primary cancer of right upper lobe of lung ([place])     Endocrine   Diabetes mellitus without complication ([place]) - Primary   Relevant Orders   POCT glycosylated hemoglobin (Hb A1C) (Completed)   Hyperlipidemia due to type 2 diabetes mellitus ([place])   Relevant Orders   POCT glycosylated hemoglobin (Hb A1C) (Completed)     Other   Generalized anxiety disorder     Return in about 3 months (around 11/10/2022) for medicare wellness, FBW a week prior to visit with urine microalbumin .          [name], NP  [facility] at [facility] [phone] (phone) [phone] (fax)  [place]

## 2022-08-15 ENCOUNTER — Other Ambulatory Visit: Payer: Self-pay | Admitting: Physician Assistant

## 2022-08-15 DIAGNOSIS — C3411 Malignant neoplasm of upper lobe, right bronchus or lung: Secondary | ICD-10-CM

## 2022-08-16 ENCOUNTER — Other Ambulatory Visit: Payer: Self-pay | Admitting: Physician Assistant

## 2022-08-16 DIAGNOSIS — C3490 Malignant neoplasm of unspecified part of unspecified bronchus or lung: Secondary | ICD-10-CM

## 2022-08-18 ENCOUNTER — Other Ambulatory Visit: Payer: Self-pay | Admitting: Nurse Practitioner

## 2022-08-18 DIAGNOSIS — E119 Type 2 diabetes mellitus without complications: Secondary | ICD-10-CM

## 2022-08-24 ENCOUNTER — Ambulatory Visit
Admission: RE | Admit: 2022-08-24 | Discharge: 2022-08-24 | Disposition: A | Discharge status: Home / Self Care | Payer: Medicare Other | Source: Ambulatory Visit | Attending: Internal Medicine | Admitting: Internal Medicine

## 2022-08-24 DIAGNOSIS — K439 Ventral hernia without obstruction or gangrene: Secondary | ICD-10-CM | POA: Diagnosis not present

## 2022-08-24 DIAGNOSIS — J432 Centrilobular emphysema: Secondary | ICD-10-CM | POA: Diagnosis not present

## 2022-08-24 DIAGNOSIS — R918 Other nonspecific abnormal finding of lung field: Secondary | ICD-10-CM | POA: Diagnosis not present

## 2022-08-24 DIAGNOSIS — C3490 Malignant neoplasm of unspecified part of unspecified bronchus or lung: Secondary | ICD-10-CM

## 2022-08-24 DIAGNOSIS — I700 Atherosclerosis of aorta: Secondary | ICD-10-CM | POA: Diagnosis not present

## 2022-08-24 DIAGNOSIS — N8189 Other female genital prolapse: Secondary | ICD-10-CM | POA: Diagnosis not present

## 2022-08-24 DIAGNOSIS — C3491 Malignant neoplasm of unspecified part of right bronchus or lung: Secondary | ICD-10-CM | POA: Diagnosis not present

## 2022-08-24 MED ORDER — IOPAMIDOL (ISOVUE-300) INJECTION 61%
100.0000 mL | Freq: Once | INTRAVENOUS | Status: AC | PRN
  Administered 2022-08-24: 100 mL via INTRAVENOUS

## 2022-08-25 NOTE — Progress Notes (Signed)
[facility] OFFICE PROGRESS NOTE  [name], NP [place] [address]  DIAGNOSIS: Stage IV (T2a, N0, M1b) non-small cell lung cancer, adenocarcinoma with negative EGFR and ALK mutations diagnosed in January 2015 and presented with right upper lobe lung mass in addition to a solitary brain metastasis    PRIOR THERAPY: 1) Status post stereotactic radiotherapy to a solitary right parietal brain lesion under the care of Dr. [name] on 10/16/2013. 2) Status post palliative radiotherapy to the right lung tumor under the care of Dr. [name] completed on 12/05/2013. 3) Systemic chemotherapy with carboplatin for AUC of 5 and Alimta 500 mg/M2 every 3 weeks. First dose Jan 06 2014. Status post 6 cycles.  CURRENT THERAPY: Systemic chemotherapy with maintenance Alimta 500 MG/M2 every 3 weeks, status post 141 cycles.    INTERVAL HISTORY: Alexis [name] 71 y.o. female returns to the clinic today for a follow up visit accompanied by her husband. The patient is feeling fairly well today with out any concerning complaints. In the interval, she was wearing an old pair of shoes and it caused her to step off the side walk and fall. She landed in flower bed. She did not hit her head or lose consciousness. She is currently undergoing single agent chemotherapy with Alimta.  She tolerates this well.  Denies any fever, chills, night sweats, or unexplained weight loss.  Denies any chest pain, cough, or hemoptysis.  She denies any significant or worsening shortness of breath.  Denies any nausea or vomiting.  She sometimes has constipation. She tries to eat vegetables and yogurt. She also has prunes daily. However, she had some loose stool after drinking the oral contrast for her CT scan. Denies any headache or visual changes. The patient recently had a restaging CT scan performed.  She is here today for evaluation and to review her scan results before starting cycle 142.    MEDICAL  HISTORY: Past Medical History:  Diagnosis Date   Anxiety    Anxiety 06/20/2016   Cervical cancer ([place])    Diabetes mellitus without complication ([place])    patient states she has type 2   Encounter for antineoplastic chemotherapy 07/20/2015   Malignant neoplasm of right upper lobe of lung ([facility])     non small cell lung cancer adenocarcioma with brain meta    ALLERGIES:  is allergic to glimepiride and codeine.  MEDICATIONS:  Current Outpatient Medications  Medication Sig Dispense Refill   acetaminophen (TYLENOL) 500 MG tablet Take 500 mg by mouth every 6 (six) hours as needed for mild pain or headache. Reported on 11/02/2015     ALPRAZolam (XANAX) 1 MG tablet TAKE 1/2 A TABLET (0.5 MG TOTAL) BY MOUTH 2 (TWO) TIMES DAILY AS NEEDED FOR ANXIETY. 30 tablet 2   Ascorbic Acid (VITAMIN C GUMMIE PO) Take 1 each by mouth every morning.     aspirin EC 81 MG tablet Take 1 tablet (81 mg total) by mouth daily. 150 tablet 2   Cranberry 240 MG CAPS Take 1 capsule by mouth daily. This is in her probiotic     [employer] ACID CONTROLLER 10 MG tablet SMARTSIG:1 Tablet(s) By Mouth Every 12 Hours PRN     dexamethasone (DECADRON) 4 MG tablet TAKE 1 TABLET TWICE A DAY THE DAY BEFORE, THE DAY OF, AND THE DAY AFTER CHEMOTHERAPY. 40 tablet 2   fluconazole (DIFLUCAN) 150 MG tablet Take 1 tablet po once. May repeat dose in 3 days as  needed for persistent symptoms. 3 tablet 1   folic acid (FOLVITE) 1 MG tablet TAKE 1 TABLET BY MOUTH EVERY DAY 90 tablet 0   glimepiride (AMARYL) 2 MG tablet TAKE 1 TABLET BY MOUTH DAILY BEFORE BREAKFAST. 90 tablet 0   loratadine (CLARITIN) 10 MG tablet Take 10 mg by mouth daily.     Multiple Vitamin (MULTIVITAMIN WITH MINERALS) TABS tablet Take 1 tablet by mouth every morning.     omeprazole (PRILOSEC) 20 MG capsule TAKE 1 CAPSULE BY MOUTH EVERY DAY 90 capsule 0   ondansetron (ZOFRAN) 8 MG tablet TAKE 1 TABLET BY MOUTH BEFORE CHEMO 30 tablet 1   OVER THE COUNTER MEDICATION Take 1 tablet by mouth  every morning. (Vitamin A)     phenazopyridine (PYRIDIUM) 200 MG tablet Take 1 tablet (200 mg total) by mouth every 8 (eight) hours as needed. 10 tablet 0   prochlorperazine (COMPAZINE) 10 MG tablet Take 1 tablet (10 mg total) by mouth every 6 (six) hours as needed for nausea or vomiting. 60 tablet 0   rosuvastatin (CRESTOR) 10 MG tablet Take 1 tablet (10 mg total) by mouth daily. 30 tablet 12   senna-docusate (SENOKOT-S) 8.6-50 MG tablet Take 1 tablet by mouth daily. 30 tablet prn   Vitamin D, Ergocalciferol, (DRISDOL) 1.25 MG (50000 UNIT) CAPS capsule Take 50,000 Units by mouth once a week. Vit d 3     No current facility-administered medications for this visit.   Facility-Administered Medications Ordered in Other Visits  Medication Dose Route Frequency Provider Last Rate Last Admin   dexamethasone (DECADRON) 10 mg in sodium chloride 0.9 % 50 mL IVPB  10 mg Intravenous Once Curt Bears, MD       PEMEtrexed (ALIMTA) 900 mg in sodium chloride 0.9 % 100 mL chemo infusion  500 mg/m2 (Treatment Plan Recorded) Intravenous Once Curt Bears, MD       sodium chloride flush (NS) 0.9 % injection 10 mL  10 mL Intracatheter PRN Curt Bears, MD        SURGICAL HISTORY:  Past Surgical History:  Procedure Laterality Date   ABDOMINAL HYSTERECTOMY     COLOSTOMY TAKEDOWN N/A 07/10/2014   Procedure: LAPAROSCOPIC LYSIS OF ADHESIONS (90 MIN) LAPAROSCOPIC ASSISTED COLOSTOMY CLOSURE, RIGID PROCTOSIGMOIDOSCOPY;  Surgeon: Jackolyn Confer, MD;  Location: [facility];  Service: General;  Laterality: N/A;   LAPAROTOMY N/A 11/03/2013   Procedure: EXPLORATORY LAPAROTOMY, DRAINAGE OF INTRA  ABDOMINAL ABSCESSES, MOBILIZATION OF SPLENIC FLEXURE, SIGMOID COLECTOMY WITH COLOSTOMY;  Surgeon: Odis Hollingshead, MD;  Location: [facility];  Service: General;  Laterality: N/A;   VIDEO BRONCHOSCOPY Bilateral 08/30/2013   Procedure: VIDEO BRONCHOSCOPY WITH FLUORO;  Surgeon: Tanda Rockers, MD;  Location: [facility];  Service:  Cardiopulmonary;  Laterality: Bilateral;    REVIEW OF SYSTEMS:   Constitutional: Negative for appetite change, chills, fatigue, fever and unexpected weight change.  HENT: Negative for mouth sores, nosebleeds, sore throat and trouble swallowing.   Eyes: Negative for eye problems and icterus.  Respiratory: Negative for cough, hemoptysis, shortness of breath and wheezing.   Cardiovascular: Negative for chest pain and leg swelling.  Gastrointestinal: Positive for occasional constipation. Negative for abdominal pain, diarrhea, nausea and vomiting.  Genitourinary: Negative for bladder incontinence, difficulty urinating, dysuria, frequency and hematuria.   Musculoskeletal: Negative for back pain, gait problem, neck pain and neck stiffness.  Skin: Negative for itching and rash.  Neurological: Negative for dizziness, extremity weakness, gait problem, headaches, light-headedness and seizures.  Hematological: Negative for adenopathy. Does not  bruise/bleed easily.  Psychiatric/Behavioral: Negative for confusion, depression and sleep disturbance. The patient is not nervous/anxious   PHYSICAL EXAMINATION:  Blood pressure (!) 108/52, pulse 91, temperature 97.8 F (36.6 C), temperature source Temporal, resp. rate 18, height 5' 4.5" (1.638 m), weight 164 lb 14.4 oz (74.8 kg), SpO2 99 %.  ECOG PERFORMANCE STATUS: 1  Physical Exam  Constitutional: Oriented to person, place, and time and well-developed, well-nourished, and in no distress.  HENT:  Head: Normocephalic and atraumatic.  Mouth/Throat: Oropharynx is clear and moist. No oropharyngeal exudate.  Eyes: Conjunctivae are normal. Right eye exhibits no discharge. Left eye exhibits no discharge. No scleral icterus.  Neck: Normal range of motion. Neck supple.  Cardiovascular: Normal rate, regular rhythm, normal heart sounds and intact distal pulses.   Pulmonary/Chest: Effort normal and breath sounds normal. No respiratory distress. No wheezes. No rales.   Abdominal: Soft. Bowel sounds are normal. Exhibits no distension and no mass. There is no tenderness.  Musculoskeletal: Normal range of motion. Exhibits no edema.  Lymphadenopathy:    No cervical adenopathy.  Neurological: Alert and oriented to person, place, and time. Exhibits normal muscle tone. Gait normal. Coordination normal.  Skin: Skin is warm and dry. No rash noted. Not diaphoretic. No erythema. No pallor.  Psychiatric: Mood, memory and judgment normal.  Vitals reviewed.    LABORATORY DATA: Lab Results  Component Value Date   WBC 10.8 (H) 08/30/2022   HGB 11.1 (L) 08/30/2022   HCT 34.5 (L) 08/30/2022   MCV 96.6 08/30/2022   PLT 395 08/30/2022      Chemistry      Component Value Date/Time   NA 139 08/30/2022 1410   NA 137 08/31/2021 0900   NA 139 08/14/2017 0837   K 3.7 08/30/2022 1410   K 4.4 08/14/2017 0837   CL 104 08/30/2022 1410   CO2 25 08/30/2022 1410   CO2 24 08/14/2017 0837   BUN 14 08/30/2022 1410   BUN 24 08/31/2021 0900   BUN 13.3 08/14/2017 0837   CREATININE 0.71 08/30/2022 1410   CREATININE 0.8 08/14/2017 0837      Component Value Date/Time   CALCIUM 9.7 08/30/2022 1410   CALCIUM 9.3 08/14/2017 0837   ALKPHOS 83 08/30/2022 1410   ALKPHOS 107 08/14/2017 0837   AST 10 (L) 08/30/2022 1410   AST 12 08/14/2017 0837   ALT 7 08/30/2022 1410   ALT 12 08/14/2017 0837   BILITOT 0.3 08/30/2022 1410   BILITOT 0.37 08/14/2017 0837       RADIOGRAPHIC STUDIES:  CT Chest W Contrast  Result Date: 08/25/2022 CLINICAL DATA:  Right-sided lung cancer diagnosed 8 years ago with chemotherapy and radiation therapy completed 3 years ago. Non-small-cell. * Tracking Code: BO * EXAM: CT CHEST, ABDOMEN, AND PELVIS WITH CONTRAST TECHNIQUE: Multidetector CT imaging of the chest, abdomen and pelvis was performed following the standard protocol during bolus administration of intravenous contrast. RADIATION DOSE REDUCTION: This exam was performed according to the  departmental dose-optimization program which includes automated exposure control, adjustment of the mA and/or kV according to patient size and/or use of iterative reconstruction technique. CONTRAST:  154m ISOVUE-300 IOPAMIDOL (ISOVUE-300) INJECTION 61% COMPARISON:  05/19/2022 FINDINGS: CT CHEST FINDINGS Cardiovascular: Aortic atherosclerosis. Normal heart size, without pericardial effusion. No central pulmonary embolism, on this non-dedicated study. Mediastinum/Nodes: No supraclavicular adenopathy. No axillary adenopathy. No mediastinal or hilar adenopathy. Lungs/Pleura: No pleural fluid. Moderate centrilobular and paraseptal emphysema. Left apical pleuroparenchymal scarring is mild. Scattered areas of bilateral, left-greater-than-right ground-glass are  felt to be new or significantly increased since the prior. Example within the posterior left upper lobe on 46/4. Subpleural somewhat nodular minimal airspace disease in the anterior left upper lobe on 28/4 is new. Similar appearance of presumably treatment related right apical consolidation with subjacent right upper lobe ground-glass and interstitial thickening medially Musculoskeletal: Upper right rib cortical thickening again identified and likely radiation induced. No osseous destruction. CT ABDOMEN PELVIS FINDINGS Hepatobiliary: Normal liver. Normal gallbladder, without biliary ductal dilatation. Pancreas: Normal, without mass or ductal dilatation. Spleen: Normal in size, without focal abnormality. Adrenals/Urinary Tract: Normal adrenal glands. Normal kidneys, without hydronephrosis. Normal urinary bladder. Stomach/Bowel: Normal stomach, without wall thickening. Surgical sutures within the rectosigmoid colon. Colonic stool burden suggests constipation. Normal terminal ileum and appendix. Areas of ventral pelvic wall laxity containing nonobstructive small bowel. Vascular/Lymphatic: Advanced aortic and branch vessel atherosclerosis. No abdominopelvic adenopathy.  Reproductive: Hysterectomy.  No adnexal mass. Other: No significant free fluid. Mild pelvic floor laxity. No evidence of omental or peritoneal disease. Multiple small bilateral fat containing ventral abdominal wall hernias. Musculoskeletal: No acute osseous abnormality. IMPRESSION: 1. Similar appearance of radiation induced consolidation within the right apex, without local recurrence or metastatic disease. 2. Subtle areas of scattered ground-glass, greater left than right with minimal anterior left upper lobe airspace disease. Favor mild infection, including atypical etiologies. 3. Aortic atherosclerosis (ICD10-I70.0) and emphysema (ICD10-J43.9). 4.  Possible constipation. Electronically Signed   By: Abigail Miyamoto M.D.   On: 08/25/2022 14:28   CT Abdomen Pelvis W Contrast  Result Date: 08/25/2022 CLINICAL DATA:  Right-sided lung cancer diagnosed 8 years ago with chemotherapy and radiation therapy completed 3 years ago. Non-small-cell. * Tracking Code: BO * EXAM: CT CHEST, ABDOMEN, AND PELVIS WITH CONTRAST TECHNIQUE: Multidetector CT imaging of the chest, abdomen and pelvis was performed following the standard protocol during bolus administration of intravenous contrast. RADIATION DOSE REDUCTION: This exam was performed according to the departmental dose-optimization program which includes automated exposure control, adjustment of the mA and/or kV according to patient size and/or use of iterative reconstruction technique. CONTRAST:  139m ISOVUE-300 IOPAMIDOL (ISOVUE-300) INJECTION 61% COMPARISON:  05/19/2022 FINDINGS: CT CHEST FINDINGS Cardiovascular: Aortic atherosclerosis. Normal heart size, without pericardial effusion. No central pulmonary embolism, on this non-dedicated study. Mediastinum/Nodes: No supraclavicular adenopathy. No axillary adenopathy. No mediastinal or hilar adenopathy. Lungs/Pleura: No pleural fluid. Moderate centrilobular and paraseptal emphysema. Left apical pleuroparenchymal scarring is  mild. Scattered areas of bilateral, left-greater-than-right ground-glass are felt to be new or significantly increased since the prior. Example within the posterior left upper lobe on 46/4. Subpleural somewhat nodular minimal airspace disease in the anterior left upper lobe on 28/4 is new. Similar appearance of presumably treatment related right apical consolidation with subjacent right upper lobe ground-glass and interstitial thickening medially Musculoskeletal: Upper right rib cortical thickening again identified and likely radiation induced. No osseous destruction. CT ABDOMEN PELVIS FINDINGS Hepatobiliary: Normal liver. Normal gallbladder, without biliary ductal dilatation. Pancreas: Normal, without mass or ductal dilatation. Spleen: Normal in size, without focal abnormality. Adrenals/Urinary Tract: Normal adrenal glands. Normal kidneys, without hydronephrosis. Normal urinary bladder. Stomach/Bowel: Normal stomach, without wall thickening. Surgical sutures within the rectosigmoid colon. Colonic stool burden suggests constipation. Normal terminal ileum and appendix. Areas of ventral pelvic wall laxity containing nonobstructive small bowel. Vascular/Lymphatic: Advanced aortic and branch vessel atherosclerosis. No abdominopelvic adenopathy. Reproductive: Hysterectomy.  No adnexal mass. Other: No significant free fluid. Mild pelvic floor laxity. No evidence of omental or peritoneal disease. Multiple small bilateral fat containing ventral abdominal  wall hernias. Musculoskeletal: No acute osseous abnormality. IMPRESSION: 1. Similar appearance of radiation induced consolidation within the right apex, without local recurrence or metastatic disease. 2. Subtle areas of scattered ground-glass, greater left than right with minimal anterior left upper lobe airspace disease. Favor mild infection, including atypical etiologies. 3. Aortic atherosclerosis (ICD10-I70.0) and emphysema (ICD10-J43.9). 4.  Possible constipation.  Electronically Signed   By: Abigail Miyamoto M.D.   On: 08/25/2022 14:28     ASSESSMENT/PLAN:  This is a very pleasant 71 year old Caucasian female with stage IV non-small cell lung cancer, adenocarcinoma who presented with a right upper lobe lung mass in addition to a solitary brain metastatsis.  She was diagnosed in January 2015.    The patient had completed induction systemic chemotherapy with carboplatin and Alimta with a partial response.   The patient is currently being treated with single  agent maintenance Alimta.  She is status post 141 cycles.   The patient recently had a restaging CT scan performed.  Dr. Julien Nordmann reports and independently reviewed the scan and discussed the results with the patient today.  The scan showed no evidence of disease progression.    Labs were reviewed. Recommend that she  proceed with #142 today scheduled.   We will see her back for follow-up visit in 3 weeks for evaluation repeat blood work before undergoing cycle #143.  The patient was advised to call immediately if she has any concerning symptoms in the interval. The patient voices understanding of current disease status and treatment options and is in agreement with the current care plan. All questions were answered. The patient knows to call the clinic with any problems, questions or concerns. We can certainly see the patient much sooner if necessary    No orders of the defined types were placed in this encounter.     Nidya Bouyer L Shadonna Benedick, PA-C 08/30/22  ADDENDUM: Hematology/Oncology Attending: I had a face-to-face encounter with the patient today.  I reviewed her record, lab, scan and recommended her care plan.  This is a very pleasant 71 years old white female diagnosed with a stage IV non-small cell lung cancer, adenocarcinoma in January 2015 status post induction systemic chemotherapy with carboplatin and Alimta with partial response followed by maintenance treatment with single agent  Alimta since that time status post a total of 141 cycles. The patient has been tolerating this treatment fairly well with no concerning adverse effect except for occasional fatigue. She had repeat CT scan of the chest, abdomen and pelvis performed recently.  I personally and independently reviewed the scans and discussed the result with the patient and her husband. Her scan showed no concerning findings for disease progression. I recommended for her to continue her current treatment with maintenance Alimta and she will proceed with cycle #142 today. The patient will come back for follow-up visit in 3 weeks for evaluation before the next cycle of her treatment. She was advised to call immediately if she has any other concerning symptoms in the interval. The total time spent in the appointment was 30 minutes. Disclaimer: This note was dictated with voice recognition software. Similar sounding words can inadvertently be transcribed and may be missed upon review. Eilleen Kempf, MD

## 2022-08-26 MED FILL — Dexamethasone Sodium Phosphate Inj 100 MG/10ML: INTRAMUSCULAR | Qty: 1 | Status: AC

## 2022-08-30 ENCOUNTER — Inpatient Hospital Stay: Payer: Medicare Other | Attending: Internal Medicine

## 2022-08-30 ENCOUNTER — Inpatient Hospital Stay: Payer: Medicare Other

## 2022-08-30 ENCOUNTER — Other Ambulatory Visit: Payer: Self-pay

## 2022-08-30 ENCOUNTER — Inpatient Hospital Stay (HOSPITAL_BASED_OUTPATIENT_CLINIC_OR_DEPARTMENT_OTHER): Payer: Medicare Other | Admitting: Physician Assistant

## 2022-08-30 VITALS — BP 108/52 | HR 91 | Temp 97.8°F | Resp 18 | Ht 64.5 in | Wt 164.9 lb

## 2022-08-30 DIAGNOSIS — Z923 Personal history of irradiation: Secondary | ICD-10-CM | POA: Insufficient documentation

## 2022-08-30 DIAGNOSIS — Z79899 Other long term (current) drug therapy: Secondary | ICD-10-CM | POA: Diagnosis not present

## 2022-08-30 DIAGNOSIS — C3411 Malignant neoplasm of upper lobe, right bronchus or lung: Secondary | ICD-10-CM | POA: Diagnosis not present

## 2022-08-30 DIAGNOSIS — C7931 Secondary malignant neoplasm of brain: Secondary | ICD-10-CM | POA: Diagnosis not present

## 2022-08-30 DIAGNOSIS — Z5111 Encounter for antineoplastic chemotherapy: Secondary | ICD-10-CM

## 2022-08-30 LAB — CBC WITH DIFFERENTIAL (CANCER CENTER ONLY)
Abs Immature Granulocytes: 0.1 10*3/uL — ABNORMAL HIGH (ref 0.00–0.07)
Basophils Absolute: 0 10*3/uL (ref 0.0–0.1)
Basophils Relative: 0 %
Eosinophils Absolute: 0 10*3/uL (ref 0.0–0.5)
Eosinophils Relative: 0 %
HCT: 34.5 % — ABNORMAL LOW (ref 36.0–46.0)
Hemoglobin: 11.1 g/dL — ABNORMAL LOW (ref 12.0–15.0)
Immature Granulocytes: 1 %
Lymphocytes Relative: 5 %
Lymphs Abs: 0.5 10*3/uL — ABNORMAL LOW (ref 0.7–4.0)
MCH: 31.1 pg (ref 26.0–34.0)
MCHC: 32.2 g/dL (ref 30.0–36.0)
MCV: 96.6 fL (ref 80.0–100.0)
Monocytes Absolute: 0.5 10*3/uL (ref 0.1–1.0)
Monocytes Relative: 4 %
Neutro Abs: 9.7 10*3/uL — ABNORMAL HIGH (ref 1.7–7.7)
Neutrophils Relative %: 90 %
Platelet Count: 395 10*3/uL (ref 150–400)
RBC: 3.57 MIL/uL — ABNORMAL LOW (ref 3.87–5.11)
RDW: 14.9 % (ref 11.5–15.5)
WBC Count: 10.8 10*3/uL — ABNORMAL HIGH (ref 4.0–10.5)
nRBC: 0 % (ref 0.0–0.2)

## 2022-08-30 LAB — CMP (CANCER CENTER ONLY)
ALT: 7 U/L (ref 0–44)
AST: 10 U/L — ABNORMAL LOW (ref 15–41)
Albumin: 3.7 g/dL (ref 3.5–5.0)
Alkaline Phosphatase: 83 U/L (ref 38–126)
Anion gap: 10 (ref 5–15)
BUN: 14 mg/dL (ref 8–23)
CO2: 25 mmol/L (ref 22–32)
Calcium: 9.7 mg/dL (ref 8.9–10.3)
Chloride: 104 mmol/L (ref 98–111)
Creatinine: 0.71 mg/dL (ref 0.44–1.00)
GFR, Estimated: >60 mL/min (ref >60)
Glucose, Bld: 158 mg/dL — ABNORMAL HIGH (ref 70–99)
Potassium: 3.7 mmol/L (ref 3.5–5.1)
Sodium: 139 mmol/L (ref 135–145)
Total Bilirubin: 0.3 mg/dL (ref 0.3–1.2)
Total Protein: 7 g/dL (ref 6.5–8.1)

## 2022-08-30 MED ORDER — CYANOCOBALAMIN 1000 MCG/ML IJ SOLN
1000.0000 ug | Freq: Once | INTRAMUSCULAR | Status: AC
  Administered 2022-08-30: 1000 ug via INTRAMUSCULAR
  Filled 2022-08-30: qty 1

## 2022-08-30 MED ORDER — SODIUM CHLORIDE 0.9 % IV SOLN
10.0000 mg | Freq: Once | INTRAVENOUS | Status: AC
  Administered 2022-08-30: 10 mg via INTRAVENOUS
  Filled 2022-08-30: qty 10

## 2022-08-30 MED ORDER — SODIUM CHLORIDE 0.9 % IV SOLN
500.0000 mg/m2 | Freq: Once | INTRAVENOUS | Status: AC
  Administered 2022-08-30: 900 mg via INTRAVENOUS
  Filled 2022-08-30: qty 20

## 2022-08-30 MED ORDER — SODIUM CHLORIDE 0.9% FLUSH: 10.0000 mL | INTRAVENOUS | Status: DC | PRN

## 2022-08-30 MED ORDER — SODIUM CHLORIDE 0.9 % IV SOLN: Freq: Once | INTRAVENOUS | Status: AC

## 2022-09-12 ENCOUNTER — Telehealth: Payer: Self-pay | Admitting: Internal Medicine

## 2022-09-12 NOTE — Telephone Encounter (Signed)
Called patient regarding upcoming January-March appointments, patient has been called and notified.

## 2022-09-15 NOTE — Progress Notes (Signed)
[facility] OFFICE PROGRESS NOTE  [name], NP [address] [place] [address]  DIAGNOSIS: Stage IV (T2a, N0, M1b) non-small cell lung cancer, adenocarcinoma with negative EGFR and ALK mutations diagnosed in January 2015 and presented with right upper lobe lung mass in addition to a solitary brain metastasis   PRIOR THERAPY: 1) Status post stereotactic radiotherapy to a solitary right parietal brain lesion under the care of Dr. Mitzi Hansen on 10/16/2013. 2) Status post palliative radiotherapy to the right lung tumor under the care of Dr. Mitzi Hansen completed on 12/05/2013. 3) Systemic chemotherapy with carboplatin for AUC of 5 and Alimta 500 mg/M2 every 3 weeks. First dose Jan 06 2014. Status post 6 cycles.  CURRENT THERAPY: Systemic chemotherapy with maintenance Alimta 500 MG/M2 every 3 weeks, status post 142 cycles.   INTERVAL HISTORY: Alexis RONNING 72 y.o. female returns to the clinic today for a follow up visit accompanied by her husband. The patient is feeling fairly well today with out any concerning complaints. She is currently undergoing single agent chemotherapy with Alimta.  She tolerates this well.  Denies any fever, chills, night sweats, or unexplained weight loss.  Denies any chest pain, cough, or hemoptysis.  She denies any significant or worsening shortness of breath.  Denies any nausea or vomiting.  She sometimes has constipation. She tries to eat prunes. Denies any headache or visual changes.  She is here today for evaluation before starting cycle 143.    MEDICAL HISTORY: Past Medical History:  Diagnosis Date   Anxiety    Anxiety 06/20/2016   Cervical cancer (HCC)    Diabetes mellitus without complication (HCC)    patient states she has type 2   Encounter for antineoplastic chemotherapy 07/20/2015   Malignant neoplasm of right upper lobe of lung (HCC)     non small cell lung cancer adenocarcioma with brain meta    ALLERGIES:  is allergic to  glimepiride and codeine.  MEDICATIONS:  Current Outpatient Medications  Medication Sig Dispense Refill   acetaminophen (TYLENOL) 500 MG tablet Take 500 mg by mouth every 6 (six) hours as needed for mild pain or headache. Reported on 11/02/2015     ALPRAZolam (XANAX) 1 MG tablet TAKE 1/2 A TABLET (0.5 MG TOTAL) BY MOUTH 2 (TWO) TIMES DAILY AS NEEDED FOR ANXIETY. 30 tablet 2   Ascorbic Acid (VITAMIN C GUMMIE PO) Take 1 each by mouth every morning.     aspirin EC 81 MG tablet Take 1 tablet (81 mg total) by mouth daily. 150 tablet 2   Cranberry 240 MG CAPS Take 1 capsule by mouth daily. This is in her probiotic     [employer] ACID CONTROLLER 10 MG tablet SMARTSIG:1 Tablet(s) By Mouth Every 12 Hours PRN     dexamethasone (DECADRON) 4 MG tablet TAKE 1 TABLET TWICE A DAY THE DAY BEFORE, THE DAY OF, AND THE DAY AFTER CHEMOTHERAPY. 40 tablet 2   fluconazole (DIFLUCAN) 150 MG tablet Take 1 tablet po once. May repeat dose in 3 days as needed for persistent symptoms. 3 tablet 1   folic acid (FOLVITE) 1 MG tablet TAKE 1 TABLET BY MOUTH EVERY DAY 90 tablet 0   glimepiride (AMARYL) 2 MG tablet TAKE 1 TABLET BY MOUTH DAILY BEFORE BREAKFAST. 90 tablet 0   loratadine (CLARITIN) 10 MG tablet Take 10 mg by mouth daily.     Multiple Vitamin (MULTIVITAMIN WITH MINERALS) TABS tablet Take 1 tablet by mouth every morning.  omeprazole (PRILOSEC) 20 MG capsule TAKE 1 CAPSULE BY MOUTH EVERY DAY 90 capsule 0   ondansetron (ZOFRAN) 8 MG tablet TAKE 1 TABLET BY MOUTH BEFORE CHEMO 30 tablet 1   OVER THE COUNTER MEDICATION Take 1 tablet by mouth every morning. (Vitamin A)     phenazopyridine (PYRIDIUM) 200 MG tablet Take 1 tablet (200 mg total) by mouth every 8 (eight) hours as needed. 10 tablet 0   prochlorperazine (COMPAZINE) 10 MG tablet Take 1 tablet (10 mg total) by mouth every 6 (six) hours as needed for nausea or vomiting. 60 tablet 0   rosuvastatin (CRESTOR) 10 MG tablet Take 1 tablet (10 mg total) by mouth daily. 30 tablet  12   senna-docusate (SENOKOT-S) 8.6-50 MG tablet Take 1 tablet by mouth daily. 30 tablet prn   Vitamin D, Ergocalciferol, (DRISDOL) 1.25 MG (50000 UNIT) CAPS capsule Take 50,000 Units by mouth once a week. Vit d 3     No current facility-administered medications for this visit.    SURGICAL HISTORY:  Past Surgical History:  Procedure Laterality Date   ABDOMINAL HYSTERECTOMY     COLOSTOMY TAKEDOWN N/A 07/10/2014   Procedure: LAPAROSCOPIC LYSIS OF ADHESIONS (90 MIN) LAPAROSCOPIC ASSISTED COLOSTOMY CLOSURE, RIGID PROCTOSIGMOIDOSCOPY;  Surgeon: Avel Peace, MD;  Location: [facility];  Service: General;  Laterality: N/A;   LAPAROTOMY N/A 11/03/2013   Procedure: EXPLORATORY LAPAROTOMY, DRAINAGE OF INTRA  ABDOMINAL ABSCESSES, MOBILIZATION OF SPLENIC FLEXURE, SIGMOID COLECTOMY WITH COLOSTOMY;  Surgeon: Adolph Pollack, MD;  Location: [facility];  Service: General;  Laterality: N/A;   VIDEO BRONCHOSCOPY Bilateral 08/30/2013   Procedure: VIDEO BRONCHOSCOPY WITH FLUORO;  Surgeon: Nyoka Cowden, MD;  Location: [facility];  Service: Cardiopulmonary;  Laterality: Bilateral;    REVIEW OF SYSTEMS:   Constitutional: Negative for appetite change, chills, fatigue, fever and unexpected weight change.  HENT: Negative for mouth sores, nosebleeds, sore throat and trouble swallowing.   Eyes: Negative for eye problems and icterus.  Respiratory: Negative for cough, hemoptysis, shortness of breath and wheezing.   Cardiovascular: Negative for chest pain and leg swelling.  Gastrointestinal: Positive for occasional constipation. Negative for abdominal pain, diarrhea, nausea and vomiting.  Genitourinary: Negative for bladder incontinence, difficulty urinating, dysuria, frequency and hematuria.   Musculoskeletal: Negative for back pain, gait problem, neck pain and neck stiffness.  Skin: Negative for itching and rash.  Neurological: Negative for dizziness, extremity weakness, gait problem, headaches, light-headedness and  seizures.  Hematological: Negative for adenopathy. Does not bruise/bleed easily.  Psychiatric/Behavioral: Negative for confusion, depression and sleep disturbance. The patient is not nervous/anxious   PHYSICAL EXAMINATION:  There were no vitals taken for this visit.  ECOG PERFORMANCE STATUS: 1  Physical Exam  Constitutional: Oriented to person, place, and time and well-developed, well-nourished, and in no distress.  HENT:  Head: Normocephalic and atraumatic.  Mouth/Throat: Oropharynx is clear and moist. No oropharyngeal exudate.  Eyes: Conjunctivae are normal. Right eye exhibits no discharge. Left eye exhibits no discharge. No scleral icterus.  Neck: Normal range of motion. Neck supple.  Cardiovascular: Normal rate, regular rhythm, normal heart sounds and intact distal pulses.   Pulmonary/Chest: Effort normal and breath sounds normal. No respiratory distress. No wheezes. No rales.  Abdominal: Soft. Bowel sounds are normal. Exhibits no distension and no mass. There is no tenderness.  Musculoskeletal: Normal range of motion. Exhibits no edema.  Lymphadenopathy:    No cervical adenopathy.  Neurological: Alert and oriented to person, place, and time. Exhibits normal muscle tone. Gait normal. Coordination  normal.  Skin: Skin is warm and dry. No rash noted. Not diaphoretic. No erythema. No pallor.  Psychiatric: Mood, memory and judgment normal.  Vitals reviewed.  LABORATORY DATA: Lab Results  Component Value Date   WBC 10.8 (H) 08/30/2022   HGB 11.1 (L) 08/30/2022   HCT 34.5 (L) 08/30/2022   MCV 96.6 08/30/2022   PLT 395 08/30/2022      Chemistry      Component Value Date/Time   NA 139 08/30/2022 1410   NA 137 08/31/2021 0900   NA 139 08/14/2017 0837   K 3.7 08/30/2022 1410   K 4.4 08/14/2017 0837   CL 104 08/30/2022 1410   CO2 25 08/30/2022 1410   CO2 24 08/14/2017 0837   BUN 14 08/30/2022 1410   BUN 24 08/31/2021 0900   BUN 13.3 08/14/2017 0837   CREATININE 0.71  08/30/2022 1410   CREATININE 0.8 08/14/2017 0837      Component Value Date/Time   CALCIUM 9.7 08/30/2022 1410   CALCIUM 9.3 08/14/2017 0837   ALKPHOS 83 08/30/2022 1410   ALKPHOS 107 08/14/2017 0837   AST 10 (L) 08/30/2022 1410   AST 12 08/14/2017 0837   ALT 7 08/30/2022 1410   ALT 12 08/14/2017 0837   BILITOT 0.3 08/30/2022 1410   BILITOT 0.37 08/14/2017 0837       RADIOGRAPHIC STUDIES:  CT Chest W Contrast  Result Date: 08/25/2022 CLINICAL DATA:  Right-sided lung cancer diagnosed 8 years ago with chemotherapy and radiation therapy completed 3 years ago. Non-small-cell. * Tracking Code: BO * EXAM: CT CHEST, ABDOMEN, AND PELVIS WITH CONTRAST TECHNIQUE: Multidetector CT imaging of the chest, abdomen and pelvis was performed following the standard protocol during bolus administration of intravenous contrast. RADIATION DOSE REDUCTION: This exam was performed according to the departmental dose-optimization program which includes automated exposure control, adjustment of the mA and/or kV according to patient size and/or use of iterative reconstruction technique. CONTRAST:  ISOVUE-300 IOPAMIDOL (ISOVUE-300) INJECTION 61% COMPARISON:  05/19/2022 FINDINGS: CT CHEST FINDINGS Cardiovascular: Aortic atherosclerosis. Normal heart size, without pericardial effusion. No central pulmonary embolism, on this non-dedicated study. Mediastinum/Nodes: No supraclavicular adenopathy. No axillary adenopathy. No mediastinal or hilar adenopathy. Lungs/Pleura: No pleural fluid. Moderate centrilobular and paraseptal emphysema. Left apical pleuroparenchymal scarring is mild. Scattered areas of bilateral, left-greater-than-right ground-glass are felt to be new or significantly increased since the prior. Example within the posterior left upper lobe on 46/4. Subpleural somewhat nodular minimal airspace disease in the anterior left upper lobe on 28/4 is new. Similar appearance of presumably treatment related right  apical consolidation with subjacent right upper lobe ground-glass and interstitial thickening medially Musculoskeletal: Upper right rib cortical thickening again identified and likely radiation induced. No osseous destruction. CT ABDOMEN PELVIS FINDINGS Hepatobiliary: Normal liver. Normal gallbladder, without biliary ductal dilatation. Pancreas: Normal, without mass or ductal dilatation. Spleen: Normal in size, without focal abnormality. Adrenals/Urinary Tract: Normal adrenal glands. Normal kidneys, without hydronephrosis. Normal urinary bladder. Stomach/Bowel: Normal stomach, without wall thickening. Surgical sutures within the rectosigmoid colon. Colonic stool burden suggests constipation. Normal terminal ileum and appendix. Areas of ventral pelvic wall laxity containing nonobstructive small bowel. Vascular/Lymphatic: Advanced aortic and branch vessel atherosclerosis. No abdominopelvic adenopathy. Reproductive: Hysterectomy.  No adnexal mass. Other: No significant free fluid. Mild pelvic floor laxity. No evidence of omental or peritoneal disease. Multiple small bilateral fat containing ventral abdominal wall hernias. Musculoskeletal: No acute osseous abnormality. IMPRESSION: 1. Similar appearance of radiation induced consolidation within the right apex, without local recurrence  or metastatic disease. 2. Subtle areas of scattered ground-glass, greater left than right with minimal anterior left upper lobe airspace disease. Favor mild infection, including atypical etiologies. 3. Aortic atherosclerosis (ICD10-I70.0) and emphysema (ICD10-J43.9). 4.  Possible constipation. Electronically Signed   By: Abigail Miyamoto M.D.   On: 08/25/2022 14:28   CT Abdomen Pelvis W Contrast  Result Date: 08/25/2022 CLINICAL DATA:  Right-sided lung cancer diagnosed 8 years ago with chemotherapy and radiation therapy completed 3 years ago. Non-small-cell. * Tracking Code: BO * EXAM: CT CHEST, ABDOMEN, AND PELVIS WITH CONTRAST TECHNIQUE:  Multidetector CT imaging of the chest, abdomen and pelvis was performed following the standard protocol during bolus administration of intravenous contrast. RADIATION DOSE REDUCTION: This exam was performed according to the departmental dose-optimization program which includes automated exposure control, adjustment of the mA and/or kV according to patient size and/or use of iterative reconstruction technique. CONTRAST:  168mL ISOVUE-300 IOPAMIDOL (ISOVUE-300) INJECTION 61% COMPARISON:  05/19/2022 FINDINGS: CT CHEST FINDINGS Cardiovascular: Aortic atherosclerosis. Normal heart size, without pericardial effusion. No central pulmonary embolism, on this non-dedicated study. Mediastinum/Nodes: No supraclavicular adenopathy. No axillary adenopathy. No mediastinal or hilar adenopathy. Lungs/Pleura: No pleural fluid. Moderate centrilobular and paraseptal emphysema. Left apical pleuroparenchymal scarring is mild. Scattered areas of bilateral, left-greater-than-right ground-glass are felt to be new or significantly increased since the prior. Example within the posterior left upper lobe on 46/4. Subpleural somewhat nodular minimal airspace disease in the anterior left upper lobe on 28/4 is new. Similar appearance of presumably treatment related right apical consolidation with subjacent right upper lobe ground-glass and interstitial thickening medially Musculoskeletal: Upper right rib cortical thickening again identified and likely radiation induced. No osseous destruction. CT ABDOMEN PELVIS FINDINGS Hepatobiliary: Normal liver. Normal gallbladder, without biliary ductal dilatation. Pancreas: Normal, without mass or ductal dilatation. Spleen: Normal in size, without focal abnormality. Adrenals/Urinary Tract: Normal adrenal glands. Normal kidneys, without hydronephrosis. Normal urinary bladder. Stomach/Bowel: Normal stomach, without wall thickening. Surgical sutures within the rectosigmoid colon. Colonic stool burden suggests  constipation. Normal terminal ileum and appendix. Areas of ventral pelvic wall laxity containing nonobstructive small bowel. Vascular/Lymphatic: Advanced aortic and branch vessel atherosclerosis. No abdominopelvic adenopathy. Reproductive: Hysterectomy.  No adnexal mass. Other: No significant free fluid. Mild pelvic floor laxity. No evidence of omental or peritoneal disease. Multiple small bilateral fat containing ventral abdominal wall hernias. Musculoskeletal: No acute osseous abnormality. IMPRESSION: 1. Similar appearance of radiation induced consolidation within the right apex, without local recurrence or metastatic disease. 2. Subtle areas of scattered ground-glass, greater left than right with minimal anterior left upper lobe airspace disease. Favor mild infection, including atypical etiologies. 3. Aortic atherosclerosis (ICD10-I70.0) and emphysema (ICD10-J43.9). 4.  Possible constipation. Electronically Signed   By: Abigail Miyamoto M.D.   On: 08/25/2022 14:28     ASSESSMENT/PLAN:  This is a very pleasant 72 year old Caucasian female with stage IV non-small cell lung cancer, adenocarcinoma who presented with a right upper lobe lung mass in addition to a solitary brain metastatsis.  She was diagnosed in January 2015.    The patient had completed induction systemic chemotherapy with carboplatin and Alimta with a partial response.   The patient is currently being treated with single  agent maintenance Alimta.  She is status post 142 cycles.   Labs were reviewed. Recommend that she  proceed with #143 today scheduled.    We will see her back for follow-up visit in 3 weeks for evaluation repeat blood work before undergoing cycle #144  The patient was advised to  call immediately if she has any concerning symptoms in the interval. The patient voices understanding of current disease status and treatment options and is in agreement with the current care plan. All questions were answered. The patient knows  to call the clinic with any problems, questions or concerns. We can certainly see the patient much sooner if necessary     No orders of the defined types were placed in this encounter.    The total time spent in the appointment was 20-29 minutes.   Sota Hetz L Berneita Sanagustin, PA-C 09/15/22

## 2022-09-19 ENCOUNTER — Inpatient Hospital Stay (HOSPITAL_BASED_OUTPATIENT_CLINIC_OR_DEPARTMENT_OTHER): Payer: Medicare Other | Admitting: Physician Assistant

## 2022-09-19 ENCOUNTER — Inpatient Hospital Stay: Payer: Medicare Other

## 2022-09-19 ENCOUNTER — Other Ambulatory Visit: Payer: Self-pay

## 2022-09-19 VITALS — BP 126/74 | HR 104 | Temp 97.6°F | Resp 16 | Wt 164.7 lb

## 2022-09-19 VITALS — HR 88

## 2022-09-19 DIAGNOSIS — Z5111 Encounter for antineoplastic chemotherapy: Secondary | ICD-10-CM

## 2022-09-19 DIAGNOSIS — C3411 Malignant neoplasm of upper lobe, right bronchus or lung: Secondary | ICD-10-CM | POA: Diagnosis not present

## 2022-09-19 DIAGNOSIS — Z923 Personal history of irradiation: Secondary | ICD-10-CM | POA: Diagnosis not present

## 2022-09-19 DIAGNOSIS — C7931 Secondary malignant neoplasm of brain: Secondary | ICD-10-CM | POA: Diagnosis not present

## 2022-09-19 DIAGNOSIS — Z79899 Other long term (current) drug therapy: Secondary | ICD-10-CM | POA: Diagnosis not present

## 2022-09-19 LAB — CBC WITH DIFFERENTIAL (CANCER CENTER ONLY)
Abs Immature Granulocytes: 0.15 10*3/uL — ABNORMAL HIGH (ref 0.00–0.07)
Basophils Absolute: 0 10*3/uL (ref 0.0–0.1)
Basophils Relative: 0 %
Eosinophils Absolute: 0 10*3/uL (ref 0.0–0.5)
Eosinophils Relative: 0 %
HCT: 33.1 % — ABNORMAL LOW (ref 36.0–46.0)
Hemoglobin: 10.7 g/dL — ABNORMAL LOW (ref 12.0–15.0)
Immature Granulocytes: 1 %
Lymphocytes Relative: 5 %
Lymphs Abs: 0.6 10*3/uL — ABNORMAL LOW (ref 0.7–4.0)
MCH: 31.5 pg (ref 26.0–34.0)
MCHC: 32.3 g/dL (ref 30.0–36.0)
MCV: 97.4 fL (ref 80.0–100.0)
Monocytes Absolute: 0.8 10*3/uL (ref 0.1–1.0)
Monocytes Relative: 6 %
Neutro Abs: 10.9 10*3/uL — ABNORMAL HIGH (ref 1.7–7.7)
Neutrophils Relative %: 88 %
Platelet Count: 413 10*3/uL — ABNORMAL HIGH (ref 150–400)
RBC: 3.4 MIL/uL — ABNORMAL LOW (ref 3.87–5.11)
RDW: 15 % (ref 11.5–15.5)
WBC Count: 12.5 10*3/uL — ABNORMAL HIGH (ref 4.0–10.5)
nRBC: 0 % (ref 0.0–0.2)

## 2022-09-19 LAB — CMP (CANCER CENTER ONLY)
ALT: 6 U/L (ref 0–44)
AST: 8 U/L — ABNORMAL LOW (ref 15–41)
Albumin: 3.7 g/dL (ref 3.5–5.0)
Alkaline Phosphatase: 93 U/L (ref 38–126)
Anion gap: 9 (ref 5–15)
BUN: 21 mg/dL (ref 8–23)
CO2: 25 mmol/L (ref 22–32)
Calcium: 9.9 mg/dL (ref 8.9–10.3)
Chloride: 105 mmol/L (ref 98–111)
Creatinine: 0.81 mg/dL (ref 0.44–1.00)
GFR, Estimated: >60 mL/min (ref >60)
Glucose, Bld: 191 mg/dL — ABNORMAL HIGH (ref 70–99)
Potassium: 3.9 mmol/L (ref 3.5–5.1)
Sodium: 139 mmol/L (ref 135–145)
Total Bilirubin: 0.3 mg/dL (ref 0.3–1.2)
Total Protein: 6.7 g/dL (ref 6.5–8.1)

## 2022-09-19 MED ORDER — SODIUM CHLORIDE 0.9 % IV SOLN
500.0000 mg/m2 | Freq: Once | INTRAVENOUS | Status: AC
  Administered 2022-09-19: 900 mg via INTRAVENOUS
  Filled 2022-09-19: qty 20

## 2022-09-19 MED ORDER — SODIUM CHLORIDE 0.9 % IV SOLN
10.0000 mg | Freq: Once | INTRAVENOUS | Status: AC
  Administered 2022-09-19: 10 mg via INTRAVENOUS
  Filled 2022-09-19: qty 10

## 2022-09-19 MED ORDER — SODIUM CHLORIDE 0.9 % IV SOLN: Freq: Once | INTRAVENOUS | Status: AC

## 2022-09-19 NOTE — Patient Instructions (Signed)
[place] [facility]  Discharge Instructions: Thank you for choosing [facility] to provide your oncology and hematology care.   If you have a lab appointment with the Cancer Center, please go directly to the Cancer Center and check in at the registration area.   Wear comfortable clothing and clothing appropriate for easy access to any Portacath or PICC line.   We strive to give you quality time with your provider. You may need to reschedule your appointment if you arrive late (15 or more minutes).  Arriving late affects you and other patients whose appointments are after yours.  Also, if you miss three or more appointments without notifying the office, you may be dismissed from the clinic at the provider's discretion.      For prescription refill requests, have your pharmacy contact our office and allow 72 hours for refills to be completed.    Today you received the following chemotherapy and/or immunotherapy agents: Alimta.       To help prevent nausea and vomiting after your treatment, we encourage you to take your nausea medication as directed.  BELOW ARE SYMPTOMS THAT SHOULD BE REPORTED IMMEDIATELY: *FEVER GREATER THAN 100.4 F (38 C) OR HIGHER *CHILLS OR SWEATING *NAUSEA AND VOMITING THAT IS NOT CONTROLLED WITH YOUR NAUSEA MEDICATION *UNUSUAL SHORTNESS OF BREATH *UNUSUAL BRUISING OR BLEEDING *URINARY PROBLEMS (pain or burning when urinating, or frequent urination) *BOWEL PROBLEMS (unusual diarrhea, constipation, pain near the anus) TENDERNESS IN MOUTH AND THROAT WITH OR WITHOUT PRESENCE OF ULCERS (sore throat, sores in mouth, or a toothache) UNUSUAL RASH, SWELLING OR PAIN  UNUSUAL VAGINAL DISCHARGE OR ITCHING   Items with * indicate a potential emergency and should be followed up as soon as possible or go to the Emergency Department if any problems should occur.  Please show the CHEMOTHERAPY ALERT CARD or IMMUNOTHERAPY ALERT CARD at  check-in to the Emergency Department and triage nurse.  Should you have questions after your visit or need to cancel or reschedule your appointment, please contact [place] [facility]  Dept: [phone]  and follow the prompts.  Office hours are 8:00 a.m. to 4:30 p.m. Monday - Friday. Please note that voicemails left after 4:00 p.m. may not be returned until the following business day.  We are closed weekends and major holidays. You have access to a nurse at all times for urgent questions. Please call the main number to the clinic Dept: [phone] and follow the prompts.   For any non-urgent questions, you may also contact your provider using MyChart. We now offer e-Visits for anyone 1 and older to request care online for non-urgent symptoms. For details visit [email or web address].   Also download the MyChart app! Go to the app store, search "MyChart", open the app, select [place], and log in with your MyChart username and password.

## 2022-09-25 ENCOUNTER — Other Ambulatory Visit: Payer: Self-pay

## 2022-09-28 ENCOUNTER — Telehealth: Payer: Self-pay | Admitting: Radiation Therapy

## 2022-09-28 NOTE — Telephone Encounter (Signed)
I spoke with Mrs. Sickles about her upcoming brain MRI and telephone follow-up with Bryson Ha in May. She has the information and plans to attend.   Mont Dutton R.T.(R)(T) Radiation Special Procedures Navigator

## 2022-10-07 MED FILL — Dexamethasone Sodium Phosphate Inj 100 MG/10ML: INTRAMUSCULAR | Qty: 1 | Status: AC

## 2022-10-10 ENCOUNTER — Inpatient Hospital Stay: Payer: Medicare Other

## 2022-10-10 ENCOUNTER — Other Ambulatory Visit: Payer: Self-pay

## 2022-10-10 ENCOUNTER — Inpatient Hospital Stay: Payer: Medicare Other | Attending: Internal Medicine | Admitting: Internal Medicine

## 2022-10-10 VITALS — BP 124/65

## 2022-10-10 VITALS — BP 140/61 | HR 90 | Temp 98.0°F | Resp 15 | Wt 165.2 lb

## 2022-10-10 DIAGNOSIS — Z90710 Acquired absence of both cervix and uterus: Secondary | ICD-10-CM | POA: Insufficient documentation

## 2022-10-10 DIAGNOSIS — Z79899 Other long term (current) drug therapy: Secondary | ICD-10-CM | POA: Diagnosis not present

## 2022-10-10 DIAGNOSIS — Z923 Personal history of irradiation: Secondary | ICD-10-CM | POA: Diagnosis not present

## 2022-10-10 DIAGNOSIS — Z8541 Personal history of malignant neoplasm of cervix uteri: Secondary | ICD-10-CM | POA: Insufficient documentation

## 2022-10-10 DIAGNOSIS — C3411 Malignant neoplasm of upper lobe, right bronchus or lung: Secondary | ICD-10-CM | POA: Diagnosis not present

## 2022-10-10 DIAGNOSIS — Z5112 Encounter for antineoplastic immunotherapy: Secondary | ICD-10-CM | POA: Diagnosis not present

## 2022-10-10 DIAGNOSIS — C7931 Secondary malignant neoplasm of brain: Secondary | ICD-10-CM | POA: Diagnosis not present

## 2022-10-10 DIAGNOSIS — R000 Tachycardia, unspecified: Secondary | ICD-10-CM | POA: Insufficient documentation

## 2022-10-10 LAB — CMP (CANCER CENTER ONLY)
ALT: 9 U/L (ref 0–44)
AST: 11 U/L — ABNORMAL LOW (ref 15–41)
Albumin: 4.1 g/dL (ref 3.5–5.0)
Alkaline Phosphatase: 99 U/L (ref 38–126)
Anion gap: 9 (ref 5–15)
BUN: 26 mg/dL — ABNORMAL HIGH (ref 8–23)
CO2: 26 mmol/L (ref 22–32)
Calcium: 9.7 mg/dL (ref 8.9–10.3)
Chloride: 104 mmol/L (ref 98–111)
Creatinine: 0.98 mg/dL (ref 0.44–1.00)
GFR, Estimated: >60 mL/min (ref >60)
Glucose, Bld: 179 mg/dL — ABNORMAL HIGH (ref 70–99)
Potassium: 4.2 mmol/L (ref 3.5–5.1)
Sodium: 139 mmol/L (ref 135–145)
Total Bilirubin: 0.4 mg/dL (ref 0.3–1.2)
Total Protein: 7.2 g/dL (ref 6.5–8.1)

## 2022-10-10 LAB — CBC WITH DIFFERENTIAL (CANCER CENTER ONLY)
Abs Immature Granulocytes: 0.1 10*3/uL — ABNORMAL HIGH (ref 0.00–0.07)
Basophils Absolute: 0 10*3/uL (ref 0.0–0.1)
Basophils Relative: 0 %
Eosinophils Absolute: 0 10*3/uL (ref 0.0–0.5)
Eosinophils Relative: 0 %
HCT: 36.4 % (ref 36.0–46.0)
Hemoglobin: 11.8 g/dL — ABNORMAL LOW (ref 12.0–15.0)
Immature Granulocytes: 1 %
Lymphocytes Relative: 5 %
Lymphs Abs: 0.6 10*3/uL — ABNORMAL LOW (ref 0.7–4.0)
MCH: 31.3 pg (ref 26.0–34.0)
MCHC: 32.4 g/dL (ref 30.0–36.0)
MCV: 96.6 fL (ref 80.0–100.0)
Monocytes Absolute: 0.6 10*3/uL (ref 0.1–1.0)
Monocytes Relative: 5 %
Neutro Abs: 10.4 10*3/uL — ABNORMAL HIGH (ref 1.7–7.7)
Neutrophils Relative %: 89 %
Platelet Count: 365 10*3/uL (ref 150–400)
RBC: 3.77 MIL/uL — ABNORMAL LOW (ref 3.87–5.11)
RDW: 15.5 % (ref 11.5–15.5)
WBC Count: 11.7 10*3/uL — ABNORMAL HIGH (ref 4.0–10.5)
nRBC: 0 % (ref 0.0–0.2)

## 2022-10-10 MED ORDER — SODIUM CHLORIDE 0.9 % IV SOLN: Freq: Once | INTRAVENOUS | Status: AC

## 2022-10-10 MED ORDER — HEPARIN SOD (PORK) LOCK FLUSH 100 UNIT/ML IV SOLN: 500.0000 [IU] | Freq: Once | INTRAVENOUS | Status: DC | PRN

## 2022-10-10 MED ORDER — SODIUM CHLORIDE 0.9 % IV SOLN
500.0000 mg/m2 | Freq: Once | INTRAVENOUS | Status: AC
  Administered 2022-10-10: 900 mg via INTRAVENOUS
  Filled 2022-10-10: qty 20

## 2022-10-10 MED ORDER — SODIUM CHLORIDE 0.9% FLUSH: 10.0000 mL | INTRAVENOUS | Status: DC | PRN

## 2022-10-10 MED ORDER — SODIUM CHLORIDE 0.9 % IV SOLN
10.0000 mg | Freq: Once | INTRAVENOUS | Status: AC
  Administered 2022-10-10: 10 mg via INTRAVENOUS
  Filled 2022-10-10: qty 10

## 2022-10-10 NOTE — Progress Notes (Signed)
Alexis [name] [phone]   [phone]) [phone]  OFFICE PROGRESS NOTE  Alexis Freshwater, NP [place] [address]  DIAGNOSIS: Stage IV (T2a, N0, M1b) non-small cell lung cancer, adenocarcinoma with negative EGFR and ALK mutations diagnosed in January 2015 and presented with right upper lobe lung mass in addition to a solitary brain metastasis.  PRIOR THERAPY: 1) Status post stereotactic radiotherapy to a solitary right parietal brain lesion under the care of Dr. [name] on 10/16/2013. 2) Status post palliative radiotherapy to the right lung tumor under the care of Dr. [name] completed on 12/05/2013. 3) Systemic chemotherapy with carboplatin for AUC of 5 and Alimta 500 mg/M2 every 3 weeks. First dose Jan 06 2014. Status post 6 cycles.  CURRENT THERAPY: Systemic chemotherapy with maintenance Alimta 500 MG/M2 every 3 weeks, status post 143 cycles.  INTERVAL HISTORY: Alexis [name] 72 y.o. female returns to the clinic today for follow-up visit accompanied by her husband.  The patient is feeling fine today with no concerning complaints.  She denied having any current chest pain, shortness of breath, cough or hemoptysis.  She has no nausea, vomiting, diarrhea or constipation.  She has no headache or visual changes.  She denied having any recent weight loss or night sweats.  She is so excited about having a new puppy soon.  She is here today for evaluation before starting cycle #144 of her treatment.    MEDICAL HISTORY: Past Medical History:  Diagnosis Date   Anxiety    Anxiety 06/20/2016   Cervical cancer ([place])    Diabetes mellitus without complication ([facility])    patient states she has type 2   Encounter for antineoplastic chemotherapy 07/20/2015   Malignant neoplasm of right upper lobe of lung (HCC)     non small cell lung cancer adenocarcioma with brain meta    ALLERGIES:  is allergic to glimepiride and codeine.  MEDICATIONS:   Current Outpatient Medications  Medication Sig Dispense Refill   acetaminophen (TYLENOL) 500 MG tablet Take 500 mg by mouth every 6 (six) hours as needed for mild pain or headache. Reported on 11/02/2015     ALPRAZolam (XANAX) 1 MG tablet TAKE 1/2 A TABLET (0.5 MG TOTAL) BY MOUTH 2 (TWO) TIMES DAILY AS NEEDED FOR ANXIETY. 30 tablet 2   Ascorbic Acid (VITAMIN C GUMMIE PO) Take 1 each by mouth every morning.     aspirin EC 81 MG tablet Take 1 tablet (81 mg total) by mouth daily. 150 tablet 2   Cranberry 240 MG CAPS Take 1 capsule by mouth daily. This is in her probiotic     [employer] ACID CONTROLLER 10 MG tablet SMARTSIG:1 Tablet(s) By Mouth Every 12 Hours PRN     dexamethasone (DECADRON) 4 MG tablet TAKE 1 TABLET TWICE A DAY THE DAY BEFORE, THE DAY OF, AND THE DAY AFTER CHEMOTHERAPY. 40 tablet 2   fluconazole (DIFLUCAN) 150 MG tablet Take 1 tablet po once. May repeat dose in 3 days as needed for persistent symptoms. 3 tablet 1   folic acid (FOLVITE) 1 MG tablet TAKE 1 TABLET BY MOUTH EVERY DAY 90 tablet 0   glimepiride (AMARYL) 2 MG tablet TAKE 1 TABLET BY MOUTH DAILY BEFORE BREAKFAST. 90 tablet 0   loratadine (CLARITIN) 10 MG tablet Take 10 mg by mouth daily.     Multiple Vitamin (MULTIVITAMIN WITH MINERALS) TABS tablet Take 1 tablet by mouth every morning.     omeprazole (  PRILOSEC) 20 MG capsule TAKE 1 CAPSULE BY MOUTH EVERY DAY 90 capsule 0   ondansetron (ZOFRAN) 8 MG tablet TAKE 1 TABLET BY MOUTH BEFORE CHEMO 30 tablet 1   OVER THE COUNTER MEDICATION Take 1 tablet by mouth every morning. (Vitamin A)     phenazopyridine (PYRIDIUM) 200 MG tablet Take 1 tablet (200 mg total) by mouth every 8 (eight) hours as needed. 10 tablet 0   prochlorperazine (COMPAZINE) 10 MG tablet Take 1 tablet (10 mg total) by mouth every 6 (six) hours as needed for nausea or vomiting. 60 tablet 0   rosuvastatin (CRESTOR) 10 MG tablet Take 1 tablet (10 mg total) by mouth daily. 30 tablet 12   senna-docusate (SENOKOT-S) 8.6-50  MG tablet Take 1 tablet by mouth daily. 30 tablet prn   Vitamin D, Ergocalciferol, (DRISDOL) 1.25 MG (50000 UNIT) CAPS capsule Take 50,000 Units by mouth once a week. Vit d 3     No current facility-administered medications for this visit.    SURGICAL HISTORY:  Past Surgical History:  Procedure Laterality Date   ABDOMINAL HYSTERECTOMY     COLOSTOMY TAKEDOWN N/A 07/10/2014   Procedure: LAPAROSCOPIC LYSIS OF ADHESIONS (90 MIN) LAPAROSCOPIC ASSISTED COLOSTOMY CLOSURE, RIGID PROCTOSIGMOIDOSCOPY;  Surgeon: Jackolyn Confer, MD;  Location: [facility];  Service: General;  Laterality: N/A;   LAPAROTOMY N/A 11/03/2013   Procedure: EXPLORATORY LAPAROTOMY, DRAINAGE OF INTRA  ABDOMINAL ABSCESSES, MOBILIZATION OF SPLENIC FLEXURE, SIGMOID COLECTOMY WITH COLOSTOMY;  Surgeon: Odis Hollingshead, MD;  Location: [facility];  Service: General;  Laterality: N/A;   VIDEO BRONCHOSCOPY Bilateral 08/30/2013   Procedure: VIDEO BRONCHOSCOPY WITH FLUORO;  Surgeon: Tanda Rockers, MD;  Location: [facility];  Service: Cardiopulmonary;  Laterality: Bilateral;    REVIEW OF SYSTEMS:  A comprehensive review of systems was negative.   PHYSICAL EXAMINATION: General appearance: alert, cooperative, and no distress Head: Normocephalic, without obvious abnormality, atraumatic Neck: no adenopathy, no JVD, supple, symmetrical, trachea midline, and thyroid not enlarged, symmetric, no tenderness/mass/nodules Lymph nodes: Cervical, supraclavicular, and axillary nodes normal. Resp: clear to auscultation bilaterally Back: symmetric, no curvature. ROM normal. No CVA tenderness. Cardio: regular rate and rhythm, S1, S2 normal, no murmur, click, rub or gallop GI: soft, non-tender; bowel sounds normal; no masses,  no organomegaly Extremities: extremities normal, atraumatic, no cyanosis or edema   ECOG PERFORMANCE STATUS: 1 - Symptomatic but completely ambulatory   Blood pressure (!) 140/61, pulse 90, temperature 98 F (36.7 C), temperature  source Oral, resp. rate 15, weight 165 lb 3.2 oz (74.9 kg), SpO2 97 %.  LABORATORY DATA: Lab Results  Component Value Date   WBC 11.7 (H) 10/10/2022   HGB 11.8 (L) 10/10/2022   HCT 36.4 10/10/2022   MCV 96.6 10/10/2022   PLT 365 10/10/2022      Chemistry      Component Value Date/Time   NA 139 09/19/2022 1401   NA 137 08/31/2021 0900   NA 139 08/14/2017 0837   K 3.9 09/19/2022 1401   K 4.4 08/14/2017 0837   CL 105 09/19/2022 1401   CO2 25 09/19/2022 1401   CO2 24 08/14/2017 0837   BUN 21 09/19/2022 1401   BUN 24 08/31/2021 0900   BUN 13.3 08/14/2017 0837   CREATININE 0.81 09/19/2022 1401   CREATININE 0.8 08/14/2017 0837      Component Value Date/Time   CALCIUM 9.9 09/19/2022 1401   CALCIUM 9.3 08/14/2017 0837   ALKPHOS 93 09/19/2022 1401   ALKPHOS 107 08/14/2017 0837   AST  8 (L) 09/19/2022 1401   AST 12 08/14/2017 0837   ALT 6 09/19/2022 1401   ALT 12 08/14/2017 0837   BILITOT 0.3 09/19/2022 1401   BILITOT 0.37 08/14/2017 0837       RADIOGRAPHIC STUDIES: No results found.  ASSESSMENT AND PLAN:  This is a very pleasant 72 years old white female with metastatic non-small cell lung cancer, adenocarcinoma status post induction systemic chemotherapy with carboplatin and Alimta with partial response.  The patient has no actionable EGFR or ALK mutations at the time of her diagnosis. The patient is currently on maintenance treatment with single agent Alimta status post 143 cycles. The patient has been tolerating this treatment fairly well with no concerning adverse effects. I recommended for her to proceed with cycle #144 today as planned. For the tachycardia, I will arrange for the patient to receive 1 L of normal saline in the clinic today. She will come back for follow-up visit in 3 weeks for evaluation before the next cycle of her treatment. She was advised to call immediately if she has any other concerning symptoms in the interval. The patient voices understanding  of current disease status and treatment options and is in agreement with the current care plan. All questions were answered. The patient knows to call the clinic with any problems, questions or concerns. We can certainly see the patient much sooner if necessary.  Disclaimer: This note was dictated with voice recognition software. Similar sounding words can inadvertently be transcribed and may not be corrected upon review.

## 2022-10-10 NOTE — Progress Notes (Signed)
Patient seen by MD today  Vitals are within treatment parameters.  Labs reviewed: and are within treatment parameters."  Per physician team, patient is ready for treatment and there are NO modifications to the treatment plan.

## 2022-10-10 NOTE — Patient Instructions (Signed)
[facility]  Discharge Instructions: Thank you for choosing [facility] to provide your oncology and hematology care.   If you have a lab appointment with the [facility], please go directly to the [facility] and check in at the registration area.   Wear comfortable clothing and clothing appropriate for easy access to any Portacath or PICC line.   We strive to give you quality time with your provider. You may need to reschedule your appointment if you arrive late (15 or more minutes).  Arriving late affects you and other patients whose appointments are after yours.  Also, if you miss three or more appointments without notifying the office, you may be dismissed from the clinic at the provider's discretion.      For prescription refill requests, have your pharmacy contact our office and allow 72 hours for refills to be completed.    Today you received the following chemotherapy and/or immunotherapy agents: Alimta.       To help prevent nausea and vomiting after your treatment, we encourage you to take your nausea medication as directed.  BELOW ARE SYMPTOMS THAT SHOULD BE REPORTED IMMEDIATELY: *FEVER GREATER THAN 100.4 F (38 C) OR HIGHER *CHILLS OR SWEATING *NAUSEA AND VOMITING THAT IS NOT CONTROLLED WITH YOUR NAUSEA MEDICATION *UNUSUAL SHORTNESS OF BREATH *UNUSUAL BRUISING OR BLEEDING *URINARY PROBLEMS (pain or burning when urinating, or frequent urination) *BOWEL PROBLEMS (unusual diarrhea, constipation, pain near the anus) TENDERNESS IN MOUTH AND THROAT WITH OR WITHOUT PRESENCE OF ULCERS (sore throat, sores in mouth, or a toothache) UNUSUAL RASH, SWELLING OR PAIN  UNUSUAL VAGINAL DISCHARGE OR ITCHING   Items with * indicate a potential emergency and should be followed up as soon as possible or go to the Emergency Department if any problems should occur.  Please show the CHEMOTHERAPY ALERT CARD or IMMUNOTHERAPY ALERT CARD at  check-in to the Emergency Department and triage nurse.  Should you have questions after your visit or need to cancel or reschedule your appointment, please contact [facility]  Dept: [phone]  and follow the prompts.  Office hours are 8:00 a.m. to 4:30 p.m. Monday - Friday. Please note that voicemails left after 4:00 p.m. may not be returned until the following business day.  We are closed weekends and major holidays. You have access to a nurse at all times for urgent questions. Please call the main number to the clinic Dept: [phone] and follow the prompts.   For any non-urgent questions, you may also contact your provider using MyChart. We now offer e-Visits for anyone 30 and older to request care online for non-urgent symptoms. For details visit [email or web address].   Also download the MyChart app! Go to the app store, search "MyChart", open the app, select [facility], and log in with your MyChart username and password.

## 2022-10-24 ENCOUNTER — Other Ambulatory Visit: Payer: Self-pay | Admitting: Nurse Practitioner

## 2022-10-24 DIAGNOSIS — F411 Generalized anxiety disorder: Secondary | ICD-10-CM

## 2022-10-28 MED FILL — Dexamethasone Sodium Phosphate Inj 100 MG/10ML: INTRAMUSCULAR | Qty: 1 | Status: AC

## 2022-10-31 ENCOUNTER — Inpatient Hospital Stay (HOSPITAL_BASED_OUTPATIENT_CLINIC_OR_DEPARTMENT_OTHER): Payer: Medicare Other | Admitting: Internal Medicine

## 2022-10-31 ENCOUNTER — Encounter: Payer: Self-pay | Admitting: Internal Medicine

## 2022-10-31 ENCOUNTER — Inpatient Hospital Stay: Payer: Medicare Other

## 2022-10-31 ENCOUNTER — Inpatient Hospital Stay: Payer: Medicare Other | Attending: Internal Medicine

## 2022-10-31 ENCOUNTER — Encounter: Payer: Self-pay | Admitting: Medical Oncology

## 2022-10-31 ENCOUNTER — Other Ambulatory Visit: Payer: Self-pay

## 2022-10-31 VITALS — BP 138/67 | HR 94 | Temp 97.5°F | Resp 16 | Wt 169.6 lb

## 2022-10-31 VITALS — BP 120/61 | HR 91 | Resp 16

## 2022-10-31 DIAGNOSIS — C7931 Secondary malignant neoplasm of brain: Secondary | ICD-10-CM | POA: Insufficient documentation

## 2022-10-31 DIAGNOSIS — C3411 Malignant neoplasm of upper lobe, right bronchus or lung: Secondary | ICD-10-CM

## 2022-10-31 DIAGNOSIS — Z79899 Other long term (current) drug therapy: Secondary | ICD-10-CM | POA: Insufficient documentation

## 2022-10-31 DIAGNOSIS — Z5112 Encounter for antineoplastic immunotherapy: Secondary | ICD-10-CM | POA: Diagnosis not present

## 2022-10-31 DIAGNOSIS — Z923 Personal history of irradiation: Secondary | ICD-10-CM | POA: Diagnosis not present

## 2022-10-31 DIAGNOSIS — C3490 Malignant neoplasm of unspecified part of unspecified bronchus or lung: Secondary | ICD-10-CM | POA: Diagnosis not present

## 2022-10-31 LAB — CMP (CANCER CENTER ONLY)
ALT: 9 U/L (ref 0–44)
AST: 9 U/L — ABNORMAL LOW (ref 15–41)
Albumin: 3.9 g/dL (ref 3.5–5.0)
Alkaline Phosphatase: 107 U/L (ref 38–126)
Anion gap: 8 (ref 5–15)
BUN: 25 mg/dL — ABNORMAL HIGH (ref 8–23)
CO2: 26 mmol/L (ref 22–32)
Calcium: 9.8 mg/dL (ref 8.9–10.3)
Chloride: 105 mmol/L (ref 98–111)
Creatinine: 0.79 mg/dL (ref 0.44–1.00)
GFR, Estimated: >60 mL/min (ref >60)
Glucose, Bld: 210 mg/dL — ABNORMAL HIGH (ref 70–99)
Potassium: 4.3 mmol/L (ref 3.5–5.1)
Sodium: 139 mmol/L (ref 135–145)
Total Bilirubin: 0.3 mg/dL (ref 0.3–1.2)
Total Protein: 6.9 g/dL (ref 6.5–8.1)

## 2022-10-31 LAB — CBC WITH DIFFERENTIAL (CANCER CENTER ONLY)
Abs Immature Granulocytes: 0.08 10*3/uL — ABNORMAL HIGH (ref 0.00–0.07)
Basophils Absolute: 0 10*3/uL (ref 0.0–0.1)
Basophils Relative: 0 %
Eosinophils Absolute: 0 10*3/uL (ref 0.0–0.5)
Eosinophils Relative: 0 %
HCT: 34.2 % — ABNORMAL LOW (ref 36.0–46.0)
Hemoglobin: 11 g/dL — ABNORMAL LOW (ref 12.0–15.0)
Immature Granulocytes: 1 %
Lymphocytes Relative: 4 %
Lymphs Abs: 0.5 10*3/uL — ABNORMAL LOW (ref 0.7–4.0)
MCH: 31.2 pg (ref 26.0–34.0)
MCHC: 32.2 g/dL (ref 30.0–36.0)
MCV: 96.9 fL (ref 80.0–100.0)
Monocytes Absolute: 0.4 10*3/uL (ref 0.1–1.0)
Monocytes Relative: 3 %
Neutro Abs: 13.1 10*3/uL — ABNORMAL HIGH (ref 1.7–7.7)
Neutrophils Relative %: 92 %
Platelet Count: 358 10*3/uL (ref 150–400)
RBC: 3.53 MIL/uL — ABNORMAL LOW (ref 3.87–5.11)
RDW: 15.9 % — ABNORMAL HIGH (ref 11.5–15.5)
WBC Count: 14.1 10*3/uL — ABNORMAL HIGH (ref 4.0–10.5)
nRBC: 0 % (ref 0.0–0.2)

## 2022-10-31 MED ORDER — SODIUM CHLORIDE 0.9 % IV SOLN: Freq: Once | INTRAVENOUS | Status: AC

## 2022-10-31 MED ORDER — CYANOCOBALAMIN 1000 MCG/ML IJ SOLN
1000.0000 ug | Freq: Once | INTRAMUSCULAR | Status: AC
  Administered 2022-10-31: 1000 ug via INTRAMUSCULAR
  Filled 2022-10-31: qty 1

## 2022-10-31 MED ORDER — SODIUM CHLORIDE 0.9 % IV SOLN
10.0000 mg | Freq: Once | INTRAVENOUS | Status: AC
  Administered 2022-10-31: 10 mg via INTRAVENOUS
  Filled 2022-10-31: qty 10

## 2022-10-31 MED ORDER — SODIUM CHLORIDE 0.9 % IV SOLN
500.0000 mg/m2 | Freq: Once | INTRAVENOUS | Status: AC
  Administered 2022-10-31: 900 mg via INTRAVENOUS
  Filled 2022-10-31: qty 20

## 2022-10-31 NOTE — Addendum Note (Signed)
Addended by: Ardeen Garland on: 10/31/2022 12:17 PM   Modules accepted: Orders

## 2022-10-31 NOTE — Progress Notes (Signed)
[facility] [phone]   [phone]  OFFICE PROGRESS NOTE  [name], NP [place] [address]  DIAGNOSIS: Stage IV (T2a, N0, M1b) non-small cell lung cancer, adenocarcinoma with negative EGFR and ALK mutations diagnosed in January 2015 and presented with right upper lobe lung mass in addition to a solitary brain metastasis.  PRIOR THERAPY: 1) Status post stereotactic radiotherapy to a solitary right parietal brain lesion under the care of Dr. [name] on 10/16/2013. 2) Status post palliative radiotherapy to the right lung tumor under the care of Dr. [name] completed on 12/05/2013. 3) Systemic chemotherapy with carboplatin for AUC of 5 and Alimta 500 mg/M2 every 3 weeks. First dose Jan 06 2014. Status post 6 cycles.  CURRENT THERAPY: Systemic chemotherapy with maintenance Alimta 500 MG/M2 every 3 weeks, status post 144 cycles.  INTERVAL HISTORY: Alexis [name] 72 y.o. female returns to clinic today for follow-up visit accompanied by her husband.  The send is feeling fine today with no concerning complaints.  She denied having any chest pain, shortness of breath, cough or hemoptysis.  She has no nausea, vomiting, diarrhea or constipation.  She has no headache or visual changes.  She denied having any significant weight loss or night sweats.  She has no fever or chills.  She is here today for evaluation before starting cycle #145.   MEDICAL HISTORY: Past Medical History:  Diagnosis Date   Anxiety    Anxiety 06/20/2016   Cervical cancer ([facility])    Diabetes mellitus without complication ([facility])    patient states she has type 2   Encounter for antineoplastic chemotherapy 07/20/2015   Malignant neoplasm of right upper lobe of lung (HCC)     non small cell lung cancer adenocarcioma with brain meta    ALLERGIES:  is allergic to glimepiride and codeine.  MEDICATIONS:  Current Outpatient Medications  Medication Sig Dispense  Refill   acetaminophen (TYLENOL) 500 MG tablet Take 500 mg by mouth every 6 (six) hours as needed for mild pain or headache. Reported on 11/02/2015     ALPRAZolam (XANAX) 1 MG tablet TAKE 1/2 A TABLET (0.5 MG TOTAL) BY MOUTH 2 (TWO) TIMES DAILY AS NEEDED FOR ANXIETY. 30 tablet 2   Ascorbic Acid (VITAMIN C GUMMIE PO) Take 1 each by mouth every morning.     aspirin EC 81 MG tablet Take 1 tablet (81 mg total) by mouth daily. 150 tablet 2   Cranberry 240 MG CAPS Take 1 capsule by mouth daily. This is in her probiotic     [employer] ACID CONTROLLER 10 MG tablet SMARTSIG:1 Tablet(s) By Mouth Every 12 Hours PRN     dexamethasone (DECADRON) 4 MG tablet TAKE 1 TABLET TWICE A DAY THE DAY BEFORE, THE DAY OF, AND THE DAY AFTER CHEMOTHERAPY. 40 tablet 2   fluconazole (DIFLUCAN) 150 MG tablet Take 1 tablet po once. May repeat dose in 3 days as needed for persistent symptoms. 3 tablet 1   folic acid (FOLVITE) 1 MG tablet TAKE 1 TABLET BY MOUTH EVERY DAY 90 tablet 0   glimepiride (AMARYL) 2 MG tablet TAKE 1 TABLET BY MOUTH DAILY BEFORE BREAKFAST. 90 tablet 0   loratadine (CLARITIN) 10 MG tablet Take 10 mg by mouth daily.     Multiple Vitamin (MULTIVITAMIN WITH MINERALS) TABS tablet Take 1 tablet by mouth every morning.     omeprazole (PRILOSEC) 20 MG capsule TAKE 1 CAPSULE BY MOUTH EVERY  DAY 90 capsule 0   ondansetron (ZOFRAN) 8 MG tablet TAKE 1 TABLET BY MOUTH BEFORE CHEMO 30 tablet 1   OVER THE COUNTER MEDICATION Take 1 tablet by mouth every morning. (Vitamin A)     phenazopyridine (PYRIDIUM) 200 MG tablet Take 1 tablet (200 mg total) by mouth every 8 (eight) hours as needed. 10 tablet 0   prochlorperazine (COMPAZINE) 10 MG tablet Take 1 tablet (10 mg total) by mouth every 6 (six) hours as needed for nausea or vomiting. 60 tablet 0   rosuvastatin (CRESTOR) 10 MG tablet Take 1 tablet (10 mg total) by mouth daily. 30 tablet 12   senna-docusate (SENOKOT-S) 8.6-50 MG tablet Take 1 tablet by mouth daily. 30 tablet prn    Vitamin D, Ergocalciferol, (DRISDOL) 1.25 MG (50000 UNIT) CAPS capsule Take 50,000 Units by mouth once a week. Vit d 3     No current facility-administered medications for this visit.    SURGICAL HISTORY:  Past Surgical History:  Procedure Laterality Date   ABDOMINAL HYSTERECTOMY     COLOSTOMY TAKEDOWN N/A 07/10/2014   Procedure: LAPAROSCOPIC LYSIS OF ADHESIONS (90 MIN) LAPAROSCOPIC ASSISTED COLOSTOMY CLOSURE, RIGID PROCTOSIGMOIDOSCOPY;  Surgeon: Jackolyn Confer, MD;  Location: [facility];  Service: General;  Laterality: N/A;   LAPAROTOMY N/A 11/03/2013   Procedure: EXPLORATORY LAPAROTOMY, DRAINAGE OF INTRA  ABDOMINAL ABSCESSES, MOBILIZATION OF SPLENIC FLEXURE, SIGMOID COLECTOMY WITH COLOSTOMY;  Surgeon: Odis Hollingshead, MD;  Location: [facility];  Service: General;  Laterality: N/A;   VIDEO BRONCHOSCOPY Bilateral 08/30/2013   Procedure: VIDEO BRONCHOSCOPY WITH FLUORO;  Surgeon: Tanda Rockers, MD;  Location: [facility];  Service: Cardiopulmonary;  Laterality: Bilateral;    REVIEW OF SYSTEMS:  A comprehensive review of systems was negative.   PHYSICAL EXAMINATION: General appearance: alert, cooperative, and no distress Head: Normocephalic, without obvious abnormality, atraumatic Neck: no adenopathy, no JVD, supple, symmetrical, trachea midline, and thyroid not enlarged, symmetric, no tenderness/mass/nodules Lymph nodes: Cervical, supraclavicular, and axillary nodes normal. Resp: clear to auscultation bilaterally Back: symmetric, no curvature. ROM normal. No CVA tenderness. Cardio: regular rate and rhythm, S1, S2 normal, no murmur, click, rub or gallop GI: soft, non-tender; bowel sounds normal; no masses,  no organomegaly Extremities: extremities normal, atraumatic, no cyanosis or edema   ECOG PERFORMANCE STATUS: 1 - Symptomatic but completely ambulatory   Blood pressure 138/67, pulse 94, temperature (!) 97.5 F (36.4 C), temperature source Oral, resp. rate 16, weight 169 lb 9.6 oz (76.9 kg),  SpO2 99 %.  LABORATORY DATA: Lab Results  Component Value Date   WBC 14.1 (H) 10/31/2022   HGB 11.0 (L) 10/31/2022   HCT 34.2 (L) 10/31/2022   MCV 96.9 10/31/2022   PLT 358 10/31/2022      Chemistry      Component Value Date/Time   NA 139 10/31/2022 1056   NA 137 08/31/2021 0900   NA 139 08/14/2017 0837   K 4.3 10/31/2022 1056   K 4.4 08/14/2017 0837   CL 105 10/31/2022 1056   CO2 26 10/31/2022 1056   CO2 24 08/14/2017 0837   BUN 25 (H) 10/31/2022 1056   BUN 24 08/31/2021 0900   BUN 13.3 08/14/2017 0837   CREATININE 0.79 10/31/2022 1056   CREATININE 0.8 08/14/2017 0837      Component Value Date/Time   CALCIUM 9.8 10/31/2022 1056   CALCIUM 9.3 08/14/2017 0837   ALKPHOS 107 10/31/2022 1056   ALKPHOS 107 08/14/2017 0837   AST 9 (L) 10/31/2022 1056   AST 12  08/14/2017 0837   ALT 9 10/31/2022 1056   ALT 12 08/14/2017 0837   BILITOT 0.3 10/31/2022 1056   BILITOT 0.37 08/14/2017 0837       RADIOGRAPHIC STUDIES: No results found.  ASSESSMENT AND PLAN:  This is a very pleasant 72 years old white female with metastatic non-small cell lung cancer, adenocarcinoma status post induction systemic chemotherapy with carboplatin and Alimta with partial response.  The patient has no actionable EGFR or ALK mutations at the time of her diagnosis. The patient is currently on maintenance treatment with single agent Alimta status post 144 cycles. The patient is feeling fine today with no concerning complaints. I recommended for her to proceed with cycle #145 today as planned. I will see her back for follow-up visit in 3 weeks for evaluation with repeat CT scan of the chest before the next cycle of her treatment. The patient was advised to call immediately if she has any other concerning symptoms in the interval. The patient voices understanding of current disease status and treatment options and is in agreement with the current care plan. All questions were answered. The patient knows  to call the clinic with any problems, questions or concerns. We can certainly see the patient much sooner if necessary.  Disclaimer: This note was dictated with voice recognition software. Similar sounding words can inadvertently be transcribed and may not be corrected upon review.

## 2022-10-31 NOTE — Patient Instructions (Signed)
[facility]   Discharge Instructions: Thank you for choosing [facility] to provide your oncology and hematology care.   If you have a lab appointment with the [facility], please go directly to the [facility] and check in at the registration area.   Wear comfortable clothing and clothing appropriate for easy access to any Portacath or PICC line.   We strive to give you quality time with your provider. You may need to reschedule your appointment if you arrive late (15 or more minutes).  Arriving late affects you and other patients whose appointments are after yours.  Also, if you miss three or more appointments without notifying the office, you may be dismissed from the clinic at the provider's discretion.      For prescription refill requests, have your pharmacy contact our office and allow 72 hours for refills to be completed.    Today you received the following chemotherapy and/or immunotherapy agents: Pemetrexed (Alimta)      To help prevent nausea and vomiting after your treatment, we encourage you to take your nausea medication as directed.  BELOW ARE SYMPTOMS THAT SHOULD BE REPORTED IMMEDIATELY: *FEVER GREATER THAN 100.4 F (38 C) OR HIGHER *CHILLS OR SWEATING *NAUSEA AND VOMITING THAT IS NOT CONTROLLED WITH YOUR NAUSEA MEDICATION *UNUSUAL SHORTNESS OF BREATH *UNUSUAL BRUISING OR BLEEDING *URINARY PROBLEMS (pain or burning when urinating, or frequent urination) *BOWEL PROBLEMS (unusual diarrhea, constipation, pain near the anus) TENDERNESS IN MOUTH AND THROAT WITH OR WITHOUT PRESENCE OF ULCERS (sore throat, sores in mouth, or a toothache) UNUSUAL RASH, SWELLING OR PAIN  UNUSUAL VAGINAL DISCHARGE OR ITCHING   Items with * indicate a potential emergency and should be followed up as soon as possible or go to the Emergency Department if any problems should occur.  Please show the CHEMOTHERAPY ALERT CARD or IMMUNOTHERAPY ALERT  CARD at check-in to the Emergency Department and triage nurse.  Should you have questions after your visit or need to cancel or reschedule your appointment, please contact [facility]  Dept: [phone]  and follow the prompts.  Office hours are 8:00 a.m. to 4:30 p.m. Monday - Friday. Please note that voicemails left after 4:00 p.m. may not be returned until the following business day.  We are closed weekends and major holidays. You have access to a nurse at all times for urgent questions. Please call the main number to the clinic Dept: [phone] and follow the prompts.   For any non-urgent questions, you may also contact your provider using MyChart. We now offer e-Visits for anyone 91 and older to request care online for non-urgent symptoms. For details visit [email or web address].   Also download the MyChart app! Go to the app store, search "MyChart", open the app, select [facility], and log in with your MyChart username and password.

## 2022-10-31 NOTE — Progress Notes (Signed)
Patient seen by MD today  Vitals are within treatment parameters.  Labs reviewed: and are within treatment parameters.  Per physician team, patient is ready for treatment and there are NO modifications to the treatment plan.

## 2022-11-02 ENCOUNTER — Other Ambulatory Visit: Payer: Self-pay

## 2022-11-02 DIAGNOSIS — E119 Type 2 diabetes mellitus without complications: Secondary | ICD-10-CM

## 2022-11-02 DIAGNOSIS — E1169 Type 2 diabetes mellitus with other specified complication: Secondary | ICD-10-CM

## 2022-11-02 DIAGNOSIS — Z0000 Encounter for general adult medical examination without abnormal findings: Secondary | ICD-10-CM

## 2022-11-02 DIAGNOSIS — D649 Anemia, unspecified: Secondary | ICD-10-CM

## 2022-11-03 ENCOUNTER — Other Ambulatory Visit: Payer: Medicare Other

## 2022-11-03 DIAGNOSIS — D649 Anemia, unspecified: Secondary | ICD-10-CM | POA: Diagnosis not present

## 2022-11-03 DIAGNOSIS — E785 Hyperlipidemia, unspecified: Secondary | ICD-10-CM | POA: Diagnosis not present

## 2022-11-03 DIAGNOSIS — E1169 Type 2 diabetes mellitus with other specified complication: Secondary | ICD-10-CM

## 2022-11-03 DIAGNOSIS — Z0000 Encounter for general adult medical examination without abnormal findings: Secondary | ICD-10-CM | POA: Diagnosis not present

## 2022-11-03 DIAGNOSIS — E119 Type 2 diabetes mellitus without complications: Secondary | ICD-10-CM | POA: Diagnosis not present

## 2022-11-04 ENCOUNTER — Encounter: Payer: Self-pay | Admitting: Internal Medicine

## 2022-11-04 LAB — LIPID PANEL
Chol/HDL Ratio: 3.7 ratio (ref 0.0–4.4)
Cholesterol, Total: 216 mg/dL — ABNORMAL HIGH (ref 100–199)
HDL: 59 mg/dL (ref >39)
LDL Chol Calc (NIH): 120 mg/dL — ABNORMAL HIGH (ref 0–99)
Triglycerides: 211 mg/dL — ABNORMAL HIGH (ref 0–149)
VLDL Cholesterol Cal: 37 mg/dL (ref 5–40)

## 2022-11-04 LAB — CBC
Hematocrit: 36 % (ref 34.0–46.6)
Hemoglobin: 11.3 g/dL (ref 11.1–15.9)
MCH: 30.7 pg (ref 26.6–33.0)
MCHC: 31.4 g/dL — ABNORMAL LOW (ref 31.5–35.7)
MCV: 98 fL — ABNORMAL HIGH (ref 79–97)
Platelets: 428 10*3/uL (ref 150–450)
RBC: 3.68 x10E6/uL — ABNORMAL LOW (ref 3.77–5.28)
RDW: 14.5 % (ref 11.7–15.4)
WBC: 8.4 10*3/uL (ref 3.4–10.8)

## 2022-11-04 LAB — COMPREHENSIVE METABOLIC PANEL
ALT: 9 IU/L (ref 0–32)
AST: 10 IU/L (ref 0–40)
Albumin/Globulin Ratio: 2.1 (ref 1.2–2.2)
Albumin: 3.9 g/dL (ref 3.8–4.8)
Alkaline Phosphatase: 102 IU/L (ref 44–121)
BUN/Creatinine Ratio: 36 — ABNORMAL HIGH (ref 12–28)
BUN: 33 mg/dL — ABNORMAL HIGH (ref 8–27)
Bilirubin Total: 0.3 mg/dL (ref 0.0–1.2)
CO2: 25 mmol/L (ref 20–29)
Calcium: 8.9 mg/dL (ref 8.7–10.3)
Chloride: 99 mmol/L (ref 96–106)
Creatinine, Ser: 0.91 mg/dL (ref 0.57–1.00)
Globulin, Total: 1.9 g/dL (ref 1.5–4.5)
Glucose: 88 mg/dL (ref 70–99)
Potassium: 4 mmol/L (ref 3.5–5.2)
Sodium: 140 mmol/L (ref 134–144)
Total Protein: 5.8 g/dL — ABNORMAL LOW (ref 6.0–8.5)
eGFR: 67 mL/min/{1.73_m2} (ref >59)

## 2022-11-04 LAB — HEMOGLOBIN A1C
Est. average glucose Bld gHb Est-mCnc: 151 mg/dL
Hgb A1c MFr Bld: 6.9 % — ABNORMAL HIGH (ref 4.8–5.6)

## 2022-11-04 LAB — TSH: TSH: 3.12 u[IU]/mL (ref 0.450–4.500)

## 2022-11-05 LAB — MICROALBUMIN / CREATININE URINE RATIO
Creatinine, Urine: 89.1 mg/dL
Microalb/Creat Ratio: 7 mg/g creat (ref 0–29)
Microalbumin, Urine: 6 ug/mL

## 2022-11-08 NOTE — Progress Notes (Signed)
Overall, labs ok. Discuss with patient at visit 11/10/2022

## 2022-11-09 ENCOUNTER — Other Ambulatory Visit: Payer: Self-pay | Admitting: Physician Assistant

## 2022-11-10 ENCOUNTER — Encounter: Payer: Self-pay | Admitting: Nurse Practitioner

## 2022-11-10 ENCOUNTER — Ambulatory Visit (INDEPENDENT_AMBULATORY_CARE_PROVIDER_SITE_OTHER): Payer: Medicare Other | Admitting: Nurse Practitioner

## 2022-11-10 VITALS — BP 117/63 | HR 99 | Ht 64.5 in | Wt 169.1 lb

## 2022-11-10 DIAGNOSIS — E119 Type 2 diabetes mellitus without complications: Secondary | ICD-10-CM | POA: Diagnosis not present

## 2022-11-10 DIAGNOSIS — E1169 Type 2 diabetes mellitus with other specified complication: Secondary | ICD-10-CM

## 2022-11-10 DIAGNOSIS — C3411 Malignant neoplasm of upper lobe, right bronchus or lung: Secondary | ICD-10-CM | POA: Diagnosis not present

## 2022-11-10 DIAGNOSIS — R300 Dysuria: Secondary | ICD-10-CM | POA: Diagnosis not present

## 2022-11-10 DIAGNOSIS — N390 Urinary tract infection, site not specified: Secondary | ICD-10-CM | POA: Diagnosis not present

## 2022-11-10 DIAGNOSIS — E785 Hyperlipidemia, unspecified: Secondary | ICD-10-CM | POA: Diagnosis not present

## 2022-11-10 DIAGNOSIS — R319 Hematuria, unspecified: Secondary | ICD-10-CM | POA: Diagnosis not present

## 2022-11-10 DIAGNOSIS — Z0000 Encounter for general adult medical examination without abnormal findings: Secondary | ICD-10-CM | POA: Diagnosis not present

## 2022-11-10 LAB — POCT URINALYSIS DIP (CLINITEK)
Bilirubin, UA: NEGATIVE
Glucose, UA: NEGATIVE mg/dL
Ketones, POC UA: NEGATIVE mg/dL
Nitrite, UA: POSITIVE — AB
POC PROTEIN,UA: 30 — AB
Spec Grav, UA: 1.01 (ref 1.010–1.025)
Urobilinogen, UA: 0.2 E.U./dL
pH, UA: 6 (ref 5.0–8.0)

## 2022-11-10 MED ORDER — NITROFURANTOIN MONOHYD MACRO 100 MG PO CAPS: 100.0000 mg | ORAL_CAPSULE | Freq: Two times a day (BID) | ORAL | 0 refills | Status: DC

## 2022-11-10 NOTE — Progress Notes (Signed)
Subjective:   Alexis [name] is a 72 y.o. female who presents for Medicare Annual (Subsequent) preventive examination. -recent fasting labs  --mild elevation of LDL, triglycerides, and total cholesterol  --normal urine microalbumin  --HgbA1c 6.9 without medication at this time.  -improved renal functions  -due for mammogram and bone density tests - would like to have these tests ordered per Dr. Earlie Server.  -due for diabetic foot exam   -burning with urination --nearly every urination  --lower belly pain   Review of Systems    Referred to PCP       Objective:    Today's Vitals   11/10/22 0819 11/10/22 0836  BP: 117/63   Pulse: 99   SpO2: 99%   Weight: 169 lb 1.9 oz (76.7 kg)   Height: 5' 4.5" (1.638 m)   PainSc:  10-Worst pain ever   Body mass index is 28.58 kg/m.    Row Labels 11/10/2022    8:42 AM 10/31/2022   12:14 PM 07/12/2022    7:49 AM 06/27/2022   11:17 AM 06/12/2022    2:52 PM 05/16/2022   10:33 AM 04/11/2022   10:33 AM  Advanced Directives   Section Header. No data exists in this row.         Does Patient Have a Medical Advance Directive?   Yes Yes Yes Yes No Yes Yes  Type of Advance Directive   Living will        Does patient want to make changes to medical advance directive?   No - Patient declined   No - Patient declined  No - Patient declined No - Patient declined    Current Medications (verified) Outpatient Encounter Medications as of 11/10/2022  Medication Sig   acetaminophen (TYLENOL) 500 MG tablet Take 500 mg by mouth every 6 (six) hours as needed for mild pain or headache. Reported on 11/02/2015   ALPRAZolam (XANAX) 1 MG tablet TAKE 1/2 A TABLET (0.5 MG TOTAL) BY MOUTH 2 (TWO) TIMES DAILY AS NEEDED FOR ANXIETY.   Ascorbic Acid (VITAMIN C GUMMIE PO) Take 1 each by mouth every morning.   aspirin EC 81 MG tablet Take 1 tablet (81 mg total) by mouth daily.   Cranberry 240 MG CAPS Take 1 capsule by mouth daily. This is in her probiotic   [employer] ACID  CONTROLLER 10 MG tablet SMARTSIG:1 Tablet(s) By Mouth Every 12 Hours PRN   folic acid (FOLVITE) 1 MG tablet TAKE 1 TABLET BY MOUTH EVERY DAY   glimepiride (AMARYL) 2 MG tablet TAKE 1 TABLET BY MOUTH DAILY BEFORE BREAKFAST.   loratadine (CLARITIN) 10 MG tablet Take 10 mg by mouth daily.   Multiple Vitamin (MULTIVITAMIN WITH MINERALS) TABS tablet Take 1 tablet by mouth every morning.   nitrofurantoin, macrocrystal-monohydrate, (MACROBID) 100 MG capsule Take 1 capsule (100 mg total) by mouth 2 (two) times daily.   omeprazole (PRILOSEC) 20 MG capsule TAKE 1 CAPSULE BY MOUTH EVERY DAY   ondansetron (ZOFRAN) 8 MG tablet TAKE 1 TABLET BY MOUTH BEFORE CHEMO   OVER THE COUNTER MEDICATION Take 1 tablet by mouth every morning. (Vitamin A)   phenazopyridine (PYRIDIUM) 200 MG tablet Take 1 tablet (200 mg total) by mouth every 8 (eight) hours as needed.   prochlorperazine (COMPAZINE) 10 MG tablet Take 1 tablet (10 mg total) by mouth every 6 (six) hours as needed for nausea or vomiting.   rosuvastatin (CRESTOR) 10 MG tablet Take 1 tablet (10 mg total) by mouth daily.   senna-docusate (  SENOKOT-S) 8.6-50 MG tablet Take 1 tablet by mouth daily.   Vitamin D, Ergocalciferol, (DRISDOL) 1.25 MG (50000 UNIT) CAPS capsule Take 50,000 Units by mouth once a week. Vit d 3   No facility-administered encounter medications on file as of 11/10/2022.    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   Past Surgical History:  Procedure Laterality Date   ABDOMINAL HYSTERECTOMY     COLOSTOMY TAKEDOWN N/A 07/10/2014   Procedure: LAPAROSCOPIC LYSIS OF ADHESIONS (90  MIN) LAPAROSCOPIC ASSISTED COLOSTOMY CLOSURE, RIGID PROCTOSIGMOIDOSCOPY;  Surgeon: Jackolyn Confer, MD;  Location: [facility];  Service: General;  Laterality: N/A;   LAPAROTOMY N/A 11/03/2013   Procedure: EXPLORATORY LAPAROTOMY, DRAINAGE OF INTRA  ABDOMINAL ABSCESSES, MOBILIZATION OF SPLENIC FLEXURE, SIGMOID COLECTOMY WITH COLOSTOMY;  Surgeon: Odis Hollingshead, MD;  Location: [facility];  Service: General;  Laterality: N/A;   VIDEO BRONCHOSCOPY Bilateral 08/30/2013   Procedure: VIDEO BRONCHOSCOPY WITH FLUORO;  Surgeon: Tanda Rockers, MD;  Location: [facility];  Service: Cardiopulmonary;  Laterality: Bilateral;   Family History  Problem Relation Age of Onset   Emphysema Father        smoked   Lung cancer Father        smoked   Cancer Mother    Hypertension Mother    COPD Mother    Social History   Socioeconomic History   Marital status: Married    Spouse name: Not on file   Number of children: Not on file   Years of education: Not on file   Highest education level: Not on file  Occupational History   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  Tobacco Use   Smoking status: Former    Packs/day: 1.00    Years: 40.00    Additional pack years: 0.00    Total pack years: 40.00    Types: Cigarettes    Quit date: 09/27/2013    Years since quitting: 9.1   Smokeless tobacco: Never  Vaping Use   Vaping Use: Never used  Substance and Sexual Activity   Alcohol use: No   Drug use: No   Sexual activity: Yes  Other Topics Concern   Not on file  Social History Narrative   Not on file   Social Determinants of Health   Financial Resource Strain: Low Risk  (11/10/2022)   Overall Financial Resource Strain (CARDIA)    Difficulty of Paying Living Expenses: Not hard at all  Food Insecurity: No Food Insecurity (11/10/2022)   Hunger Vital Sign    Worried About Running Out of Food in the Last Year: Never true    [place] in the Last Year: Never true  Transportation Needs: No  Transportation Needs (11/10/2022)   PRAPARE - Hydrologist (Medical): No    Lack of Transportation (Non-Medical): No  Physical Activity: Insufficiently Active (11/10/2022)   Exercise Vital Sign    Days of Exercise per Week: 1 day    Minutes of Exercise per Session: 20 min  Stress: Stress Concern Present (11/10/2022)   [place]  Stress Questionnaire    Feeling of Stress : To some extent  Social Connections: Moderately Isolated (11/10/2022)   Social Connection and Isolation Panel [NHANES]    Frequency of Communication with Friends and Family: Once a week    Frequency of Social Gatherings with Friends and Family: Once a week    Attends Religious Services: More than 4 times per year    Active Member of [employer] or Organizations: No    Attends Music therapist: Never    Marital Status: Married    Tobacco Counseling Counseling given: Not Answered   Clinical Intake:  Pre-visit preparation completed: Yes  Pain : 0-10 Pain Score: 10-Worst pain ever Pain Type: Acute pain Pain Location: Vagina Pain Descriptors / Indicators: Burning Pain Onset: In the past 7 days Pain Frequency: Occasional     Diabetes: No  How often do you need to have someone help you when you read instructions, pamphlets, or other written materials from your doctor or pharmacy?: 1 - Never  Diabetic?no  Interpreter Needed?: No   Physical Exam Vitals and nursing note reviewed.  Constitutional:      Appearance: Normal appearance. She is well-developed.  HENT:     Head: Normocephalic and atraumatic.     Right Ear: Tympanic membrane, ear canal and external ear normal.     Left Ear: Tympanic membrane, ear canal and external ear normal.     Nose: Nose normal.     Mouth/Throat:     Mouth: Mucous membranes are moist.     Pharynx: Oropharynx is clear.  Eyes:     Pupils: Pupils are equal, round, and reactive to light.   Cardiovascular:     Rate and Rhythm: Normal rate and regular rhythm.     Pulses: Normal pulses.     Heart sounds: Normal heart sounds.  Pulmonary:     Effort: Pulmonary effort is normal.     Breath sounds: Normal breath sounds.  Abdominal:     General: Bowel sounds are normal. There is no distension.     Palpations: Abdomen is soft. There is no mass.     Tenderness: There is no abdominal tenderness. There is no right CVA tenderness, left CVA tenderness, guarding or rebound.     Hernia: No hernia is present.  Genitourinary:    Comments: Urine sample positive for nitrites, large WBC, and moderate blood.  Musculoskeletal:        General: Normal range of motion.     Cervical back: Normal range of motion and neck supple.  Lymphadenopathy:     Cervical: No cervical adenopathy.  Skin:    General: Skin is warm and dry.     Capillary Refill: Capillary refill takes less than 2 seconds.  Neurological:     General: No focal deficit present.     Mental Status: She is alert and oriented to person, place, and time.  Psychiatric:        Mood and Affect: Mood normal.        Behavior: Behavior normal.        Thought Content: Thought content normal.        Judgment: Judgment normal.       Activities of Daily Living   Row Labels 08/11/2022    2:25 PM 06/15/2022    4:04 PM  In your present state of health, do you have any difficulty performing the following activities:   Section Header. No data exists in this row.    Hearing?  0 0  Vision?   0 1  Difficulty concentrating or making decisions?   0 0  Walking or climbing stairs?   0 1  Dressing or bathing?   0 0  Doing errands, shopping?   0 0    Patient Care Team: [name], NP as PCP - General (Family Medicine) Tanda Rockers, MD as Consulting Physician (Pulmonary Disease) [name], MD as Consulting Physician (Cardiothoracic Surgery) Curt Bears, MD as Consulting Physician (Oncology) Valrie Hart, RN as  Oncology Nurse Navigator Kyung Rudd, MD as Consulting Physician (Radiation Oncology)  Indicate any recent Medical Services you may have received from other than [facility] providers in the past year (date may be approximate).     Assessment:  1. Encounter for Medicare annual wellness exam Annual Medicare wellness visit today   2. Urinary tract infection with hematuria, site unspecified Treat with macrobid 100 mg twice daily for next 7 days. Send urine for culture and sensitivity and adjust antibiotics as indicated.  - nitrofurantoin, macrocrystal-monohydrate, (MACROBID) 100 MG capsule; Take 1 capsule (100 mg total) by mouth 2 (two) times daily.  Dispense: 14 capsule; Refill: 0  3. Diabetes mellitus without complication (HCC) 123456 6.9 without diabetic medication at this time. Encouraged her to limit intake of carbohydrates and excess sugar. She should also increase water intake. Will recheck HgbA1c in 3 months and reassess need for medication.   4. Hyperlipidemia due to type 2 diabetes mellitus ([place]) Mild elevation of lipid panel. Continue crestor as prescribed.   5. Primary cancer of right upper lobe of lung ([place]) Continue treatments and regular visits with oncology as scheduled.   6. Dysuria Recommend she take OTC AZO as needed and as indicated to improve bladder pain and spasms.  - POCT URINALYSIS DIP (CLINITEK) - Urine Culture; Future - Urine Culture   Hearing/Vision screen No results found.   Depression Screen   Row Labels 11/10/2022    8:41 AM 08/11/2022    2:25 PM 06/15/2022    4:03 PM 05/24/2022    1:57 PM 04/20/2022   10:34 AM 01/17/2022    1:19 PM 08/31/2021    3:03 PM  PHQ 2/9 Scores   Section Header. No data exists in this row.         PHQ - 2 Score   1 0 0 0 0 3 0  PHQ- 9 Score    1 1 0 0 4 1  Exception Documentation   Medical reason          Fall Risk   Row Labels 04/20/2022   10:34 AM 08/31/2021    3:04 PM 07/26/2021    3:09 PM 01/01/2018    9:24 AM 07/03/2017     8:57 AM  Fall Risk    Section Header. No data exists in this row.       Falls in the past year?   0 0 0 No No  Number falls in past yr:   0 0 0    Injury with Fall?   0 0 0    Risk for fall due to :   No Fall Risks      Follow up   Falls evaluation completed Falls evaluation completed Falls evaluation completed      [place]:  Any stairs in or around the home? Yes  If so, are there any without handrails? No  Home free of loose throw rugs in walkways, pet beds, electrical  cords, etc? Yes  Adequate lighting in your home to reduce risk of falls? Yes   ASSISTIVE DEVICES UTILIZED TO PREVENT FALLS:  Life alert? No  Use of a cane, walker or w/c? No  Grab bars in the bathroom? Yes  Shower chair or bench in shower? No  Elevated toilet seat or a handicapped toilet? Yes   TIMED UP AND GO:  Was the test performed? Yes .  Length of time to ambulate 10 feet: 10 sec.   Gait steady and fast without use of assistive device  Cognitive Function:       Row Labels 11/10/2022    8:43 AM 09/06/2021    9:08 AM  6CIT Screen   Section Header. No data exists in this row.    What Year?   0 points 0 points  What month?   0 points 0 points  What time?   0 points 0 points  Count back from 20   0 points 0 points  Months in reverse   0 points 4 points  Repeat phrase   2 points 2 points  Total Score   2 points 6 points    Immunizations Immunization History  Administered Date(s) Administered   Fluad Quad(high Dose 65+) 06/17/2019, 06/29/2020, 07/12/2021, 06/27/2022   Influenza,inj,Quad PF,6+ Mos 05/12/2014, 06/08/2015, 05/30/2016, 05/22/2017, 05/14/2018   Influenza-Unspecified 07/19/2021   PFIZER(Purple Top)SARS-COV-2 Vaccination 10/28/2019, 11/26/2019, 06/02/2020   Pfizer Covid-19 Vaccine Bivalent Booster 49yr & up 08/09/2021   Pneumococcal Polysaccharide-23 07/27/2019   Tdap 07/20/2019   Zoster Recombinat (Shingrix) 07/14/2019, 09/21/2019    TDAP status:  Up to date  Flu Vaccine status: Up to date  Pneumococcal vaccine status: Due, Education has been provided regarding the importance of this vaccine. Advised may receive this vaccine at local pharmacy or Health Dept. Aware to provide a copy of the vaccination record if obtained from local pharmacy or Health Dept. Verbalized acceptance and understanding.  Covid-19 vaccine status: Completed vaccines  Qualifies for Shingles Vaccine? Yes   Zostavax completed Yes   Shingrix Completed?: Yes  Screening Tests Health Maintenance  Topic Date Due   OPHTHALMOLOGY EXAM  Never done   Hepatitis C Screening  Never done   MAMMOGRAM  Never done   DEXA SCAN  Never done   Pneumonia Vaccine 72 Years old (2 of 2 - PCV) 07/26/2020   COVID-19 Vaccine (5 - 2023-24 season) 04/29/2022   FOOT EXAM  09/06/2022   COLONOSCOPY (Pts 45-482yrInsurance coverage will need to be confirmed)  05/25/2023 (Originally 10/04/1995)   HEMOGLOBIN A1C  05/06/2023   Diabetic kidney evaluation - eGFR measurement  11/03/2023   Diabetic kidney evaluation - Urine ACR  11/03/2023   Medicare Annual Wellness (AWV)  11/10/2023   DTaP/Tdap/Td (2 - Td or Tdap) 07/19/2029   INFLUENZA VACCINE  Completed   Zoster Vaccines- Shingrix  Completed   HPV VACCINES  Aged Out    Health Maintenance  Health Maintenance Due  Topic Date Due   OPHTHALMOLOGY EXAM  Never done   Hepatitis C Screening  Never done   MAMMOGRAM  Never done   DEXA SCAN  Never done   Pneumonia Vaccine [age]Years old (2 of 2 - PCV) 07/26/2020   COVID-19 Vaccine (5 - 2023-24 season) 04/29/2022   FOOT EXAM  09/06/2022    Patient declined colorectal cancer screening at this time 11/10/2022.   Mammogram status: No longer required due to patient preference. She would like to have this study ordered per her oncologist .  Patient prefers to have BMD ordered per her oncologist. 11/10/2022.   Lung Cancer Screening: (Low Dose CT Chest recommended if Age 61-80 years, 30 pack-year  currently smoking OR have quit w/in 15years.) does not qualify.   Lung Cancer Screening Referral: n/a  Additional Screening:  Hepatitis C Screening: does not qualify; Completed n/a  Vision Screening: Recommended annual ophthalmology exams for early detection of glaucoma and other disorders of the eye. Is the patient up to date with their annual eye exam?  Yes  Who is the provider or what is the name of the office in which the patient attends annual eye exams? Dr. Wynetta Emery If pt is not established with a provider, would they like to be referred to a provider to establish care? No .   Dental Screening: Recommended annual dental exams for proper oral hygiene  Community Resource Referral / Chronic Care Management: CRR required this visit?  No   CCM required this visit?  No      Plan:     I have personally reviewed and noted the following in the patient's chart:   Medical and social history Use of alcohol, tobacco or illicit drugs  Current medications and supplements including opioid prescriptions. Patient is not currently taking opioid prescriptions. Functional ability and status Nutritional status Physical activity Advanced directives List of other physicians Hospitalizations, surgeries, and ER visits in previous 12 months Vitals Screenings to include cognitive, depression, and falls Referrals and appointments  In addition, I have reviewed and discussed with patient certain preventive protocols, quality metrics, and best practice recommendations. A written personalized care plan for preventive services as well as general preventive health recommendations were provided to patient.  [name], NP   11/10/2022   Nurse Notes: FACE TO FACE 40 MIN

## 2022-11-12 ENCOUNTER — Other Ambulatory Visit: Payer: Self-pay | Admitting: Physician Assistant

## 2022-11-12 DIAGNOSIS — C3411 Malignant neoplasm of upper lobe, right bronchus or lung: Secondary | ICD-10-CM

## 2022-11-13 ENCOUNTER — Other Ambulatory Visit: Payer: Self-pay | Admitting: Physician Assistant

## 2022-11-13 DIAGNOSIS — C3490 Malignant neoplasm of unspecified part of unspecified bronchus or lung: Secondary | ICD-10-CM

## 2022-11-14 NOTE — Progress Notes (Signed)
Patient was started on macrobid at the time of her visit.

## 2022-11-15 ENCOUNTER — Other Ambulatory Visit: Payer: Self-pay | Admitting: Internal Medicine

## 2022-11-15 ENCOUNTER — Ambulatory Visit
Admission: RE | Admit: 2022-11-15 | Discharge: 2022-11-15 | Disposition: A | Discharge status: Home / Self Care | Payer: Medicare Other | Source: Ambulatory Visit | Attending: Internal Medicine | Admitting: Internal Medicine

## 2022-11-15 DIAGNOSIS — C3490 Malignant neoplasm of unspecified part of unspecified bronchus or lung: Secondary | ICD-10-CM

## 2022-11-15 DIAGNOSIS — C3411 Malignant neoplasm of upper lobe, right bronchus or lung: Secondary | ICD-10-CM

## 2022-11-15 DIAGNOSIS — Z8541 Personal history of malignant neoplasm of cervix uteri: Secondary | ICD-10-CM | POA: Diagnosis not present

## 2022-11-15 DIAGNOSIS — I700 Atherosclerosis of aorta: Secondary | ICD-10-CM | POA: Diagnosis not present

## 2022-11-15 DIAGNOSIS — R918 Other nonspecific abnormal finding of lung field: Secondary | ICD-10-CM | POA: Diagnosis not present

## 2022-11-15 DIAGNOSIS — J432 Centrilobular emphysema: Secondary | ICD-10-CM | POA: Diagnosis not present

## 2022-11-15 DIAGNOSIS — K6389 Other specified diseases of intestine: Secondary | ICD-10-CM | POA: Diagnosis not present

## 2022-11-15 DIAGNOSIS — K429 Umbilical hernia without obstruction or gangrene: Secondary | ICD-10-CM | POA: Diagnosis not present

## 2022-11-15 MED ORDER — IOPAMIDOL (ISOVUE-300) INJECTION 61%
100.0000 mL | Freq: Once | INTRAVENOUS | Status: AC | PRN
  Administered 2022-11-15: 100 mL via INTRAVENOUS

## 2022-11-16 LAB — SPECIMEN STATUS REPORT

## 2022-11-16 LAB — URINE CULTURE

## 2022-11-16 NOTE — Progress Notes (Signed)
Macrobid good antibiotic for this infection

## 2022-11-18 ENCOUNTER — Telehealth: Payer: Self-pay

## 2022-11-18 MED FILL — Dexamethasone Sodium Phosphate Inj 100 MG/10ML: INTRAMUSCULAR | Qty: 1 | Status: AC

## 2022-11-18 NOTE — Telephone Encounter (Signed)
Pt calling stating that she has starting feeling the burning again from UTI.  Pt is requesting another Abx.

## 2022-11-21 ENCOUNTER — Other Ambulatory Visit: Payer: Self-pay

## 2022-11-21 ENCOUNTER — Inpatient Hospital Stay (HOSPITAL_BASED_OUTPATIENT_CLINIC_OR_DEPARTMENT_OTHER): Payer: Medicare Other | Admitting: Internal Medicine

## 2022-11-21 ENCOUNTER — Inpatient Hospital Stay: Payer: Medicare Other

## 2022-11-21 ENCOUNTER — Encounter: Payer: Self-pay | Admitting: Internal Medicine

## 2022-11-21 VITALS — BP 136/60 | HR 92 | Temp 97.6°F | Resp 18

## 2022-11-21 DIAGNOSIS — Z79899 Other long term (current) drug therapy: Secondary | ICD-10-CM | POA: Diagnosis not present

## 2022-11-21 DIAGNOSIS — C3411 Malignant neoplasm of upper lobe, right bronchus or lung: Secondary | ICD-10-CM | POA: Diagnosis not present

## 2022-11-21 DIAGNOSIS — Z923 Personal history of irradiation: Secondary | ICD-10-CM | POA: Diagnosis not present

## 2022-11-21 DIAGNOSIS — Z5112 Encounter for antineoplastic immunotherapy: Secondary | ICD-10-CM | POA: Diagnosis not present

## 2022-11-21 DIAGNOSIS — C7931 Secondary malignant neoplasm of brain: Secondary | ICD-10-CM | POA: Diagnosis not present

## 2022-11-21 LAB — CMP (CANCER CENTER ONLY)
ALT: 9 U/L (ref 0–44)
AST: 11 U/L — ABNORMAL LOW (ref 15–41)
Albumin: 4.1 g/dL (ref 3.5–5.0)
Alkaline Phosphatase: 108 U/L (ref 38–126)
Anion gap: 9 (ref 5–15)
BUN: 30 mg/dL — ABNORMAL HIGH (ref 8–23)
CO2: 25 mmol/L (ref 22–32)
Calcium: 9.9 mg/dL (ref 8.9–10.3)
Chloride: 104 mmol/L (ref 98–111)
Creatinine: 1.1 mg/dL — ABNORMAL HIGH (ref 0.44–1.00)
GFR, Estimated: 54 mL/min — ABNORMAL LOW (ref >60)
Glucose, Bld: 174 mg/dL — ABNORMAL HIGH (ref 70–99)
Potassium: 4.4 mmol/L (ref 3.5–5.1)
Sodium: 138 mmol/L (ref 135–145)
Total Bilirubin: 0.4 mg/dL (ref 0.3–1.2)
Total Protein: 7 g/dL (ref 6.5–8.1)

## 2022-11-21 LAB — CBC WITH DIFFERENTIAL (CANCER CENTER ONLY)
Abs Immature Granulocytes: 0.07 10*3/uL (ref 0.00–0.07)
Basophils Absolute: 0 10*3/uL (ref 0.0–0.1)
Basophils Relative: 0 %
Eosinophils Absolute: 0 10*3/uL (ref 0.0–0.5)
Eosinophils Relative: 0 %
HCT: 37.2 % (ref 36.0–46.0)
Hemoglobin: 11.7 g/dL — ABNORMAL LOW (ref 12.0–15.0)
Immature Granulocytes: 1 %
Lymphocytes Relative: 3 %
Lymphs Abs: 0.4 10*3/uL — ABNORMAL LOW (ref 0.7–4.0)
MCH: 31 pg (ref 26.0–34.0)
MCHC: 31.5 g/dL (ref 30.0–36.0)
MCV: 98.4 fL (ref 80.0–100.0)
Monocytes Absolute: 0.7 10*3/uL (ref 0.1–1.0)
Monocytes Relative: 5 %
Neutro Abs: 12.2 10*3/uL — ABNORMAL HIGH (ref 1.7–7.7)
Neutrophils Relative %: 91 %
Platelet Count: 318 10*3/uL (ref 150–400)
RBC: 3.78 MIL/uL — ABNORMAL LOW (ref 3.87–5.11)
RDW: 16.2 % — ABNORMAL HIGH (ref 11.5–15.5)
WBC Count: 13.4 10*3/uL — ABNORMAL HIGH (ref 4.0–10.5)
nRBC: 0 % (ref 0.0–0.2)

## 2022-11-21 MED ORDER — SODIUM CHLORIDE 0.9 % IV SOLN: Freq: Once | INTRAVENOUS | Status: AC

## 2022-11-21 MED ORDER — SODIUM CHLORIDE 0.9 % IV SOLN
10.0000 mg | Freq: Once | INTRAVENOUS | Status: AC
  Administered 2022-11-21: 10 mg via INTRAVENOUS
  Filled 2022-11-21: qty 10

## 2022-11-21 MED ORDER — SODIUM CHLORIDE 0.9 % IV SOLN
500.0000 mg/m2 | Freq: Once | INTRAVENOUS | Status: AC
  Administered 2022-11-21: 900 mg via INTRAVENOUS
  Filled 2022-11-21: qty 20

## 2022-11-21 NOTE — Progress Notes (Signed)
Patient seen by MD today  Vitals are within treatment parameters.  Labs reviewed: and are within treatment parameters.  Per physician team, patient is ready for treatment and there are NO modifications to the treatment plan.  

## 2022-11-21 NOTE — Progress Notes (Signed)
[place] [phone]   [phone]) [phone]  OFFICE PROGRESS NOTE  [name], NP [place] [address]  DIAGNOSIS: Stage IV (T2a, N0, M1b) non-small cell lung cancer, adenocarcinoma with negative EGFR and ALK mutations diagnosed in January 2015 and presented with right upper lobe lung mass in addition to a solitary brain metastasis.  PRIOR THERAPY: 1) Status post stereotactic radiotherapy to a solitary right parietal brain lesion under the care of Dr. [name] on 10/16/2013. 2) Status post palliative radiotherapy to the right lung tumor under the care of Dr. [name] completed on 12/05/2013. 3) Systemic chemotherapy with carboplatin for AUC of 5 and Alimta 500 mg/M2 every 3 weeks. First dose Jan 06 2014. Status post 6 cycles.  CURRENT THERAPY: Systemic chemotherapy with maintenance Alimta 500 MG/M2 every 3 weeks, status post 145 cycles.  INTERVAL HISTORY: Alexis [name] 72 y.o. female returns to the clinic today for follow-up visit accompanied by her husband.  The patient is feeling fine today with no concerning complaints except for burning sensation in her urine and itching vagina.  She was treated for urinary tract infection recently by her primary care physician.  She is likely having some yeast infection.  She denied having any current chest pain, shortness of breath, cough or hemoptysis.  She has no nausea, vomiting, diarrhea or constipation.  She has no headache or visual changes.  She has no fever or chills.  She had repeat CT scan of the chest, abdomen and pelvis performed recently and she is here for evaluation and discussion of her scan results.  MEDICAL HISTORY: Past Medical History:  Diagnosis Date   Anxiety    Anxiety 06/20/2016   Cervical cancer ([place])    Diabetes mellitus without complication ([facility])    patient states she has type 2   Encounter for antineoplastic chemotherapy 07/20/2015   Malignant neoplasm of right  upper lobe of lung ([facility])     non small cell lung cancer adenocarcioma with brain meta    ALLERGIES:  is allergic to glimepiride and codeine.  MEDICATIONS:  Current Outpatient Medications  Medication Sig Dispense Refill   acetaminophen (TYLENOL) 500 MG tablet Take 500 mg by mouth every 6 (six) hours as needed for mild pain or headache. Reported on 11/02/2015     ALPRAZolam (XANAX) 1 MG tablet TAKE 1/2 A TABLET (0.5 MG TOTAL) BY MOUTH 2 (TWO) TIMES DAILY AS NEEDED FOR ANXIETY. 30 tablet 2   Ascorbic Acid (VITAMIN C GUMMIE PO) Take 1 each by mouth every morning.     aspirin EC 81 MG tablet Take 1 tablet (81 mg total) by mouth daily. 150 tablet 2   Cranberry 240 MG CAPS Take 1 capsule by mouth daily. This is in her probiotic     [employer] ACID CONTROLLER 10 MG tablet SMARTSIG:1 Tablet(s) By Mouth Every 12 Hours PRN     folic acid (FOLVITE) 1 MG tablet TAKE 1 TABLET BY MOUTH EVERY DAY 90 tablet 0   glimepiride (AMARYL) 2 MG tablet TAKE 1 TABLET BY MOUTH DAILY BEFORE BREAKFAST. 90 tablet 0   loratadine (CLARITIN) 10 MG tablet Take 10 mg by mouth daily.     Multiple Vitamin (MULTIVITAMIN WITH MINERALS) TABS tablet Take 1 tablet by mouth every morning.     nitrofurantoin, macrocrystal-monohydrate, (MACROBID) 100 MG capsule Take 1 capsule (100 mg total) by mouth 2 (two) times daily. 14 capsule 0   omeprazole (PRILOSEC) 20 MG  capsule TAKE 1 CAPSULE BY MOUTH EVERY DAY 90 capsule 0   ondansetron (ZOFRAN) 8 MG tablet TAKE 1 TABLET BY MOUTH BEFORE CHEMO 30 tablet 1   OVER THE COUNTER MEDICATION Take 1 tablet by mouth every morning. (Vitamin A)     phenazopyridine (PYRIDIUM) 200 MG tablet Take 1 tablet (200 mg total) by mouth every 8 (eight) hours as needed. 10 tablet 0   prochlorperazine (COMPAZINE) 10 MG tablet Take 1 tablet (10 mg total) by mouth every 6 (six) hours as needed for nausea or vomiting. 60 tablet 0   rosuvastatin (CRESTOR) 10 MG tablet Take 1 tablet (10 mg total) by mouth daily. 30 tablet 12    senna-docusate (SENOKOT-S) 8.6-50 MG tablet Take 1 tablet by mouth daily. 30 tablet prn   Vitamin D, Ergocalciferol, (DRISDOL) 1.25 MG (50000 UNIT) CAPS capsule Take 50,000 Units by mouth once a week. Vit d 3     No current facility-administered medications for this visit.    SURGICAL HISTORY:  Past Surgical History:  Procedure Laterality Date   ABDOMINAL HYSTERECTOMY     COLOSTOMY TAKEDOWN N/A 07/10/2014   Procedure: LAPAROSCOPIC LYSIS OF ADHESIONS (90 MIN) LAPAROSCOPIC ASSISTED COLOSTOMY CLOSURE, RIGID PROCTOSIGMOIDOSCOPY;  Surgeon: Jackolyn Confer, MD;  Location: [facility];  Service: General;  Laterality: N/A;   LAPAROTOMY N/A 11/03/2013   Procedure: EXPLORATORY LAPAROTOMY, DRAINAGE OF INTRA  ABDOMINAL ABSCESSES, MOBILIZATION OF SPLENIC FLEXURE, SIGMOID COLECTOMY WITH COLOSTOMY;  Surgeon: Odis Hollingshead, MD;  Location: [facility];  Service: General;  Laterality: N/A;   VIDEO BRONCHOSCOPY Bilateral 08/30/2013   Procedure: VIDEO BRONCHOSCOPY WITH FLUORO;  Surgeon: Tanda Rockers, MD;  Location: [facility];  Service: Cardiopulmonary;  Laterality: Bilateral;    REVIEW OF SYSTEMS:  Constitutional: negative Eyes: negative Ears, nose, mouth, throat, and face: negative Respiratory: negative Cardiovascular: negative Gastrointestinal: negative Genitourinary:positive for dysuria Integument/breast: negative Hematologic/lymphatic: negative Musculoskeletal:negative Neurological: negative Behavioral/Psych: negative Endocrine: negative Allergic/Immunologic: negative   PHYSICAL EXAMINATION: General appearance: alert, cooperative, and no distress Head: Normocephalic, without obvious abnormality, atraumatic Neck: no adenopathy, no JVD, supple, symmetrical, trachea midline, and thyroid not enlarged, symmetric, no tenderness/mass/nodules Lymph nodes: Cervical, supraclavicular, and axillary nodes normal. Resp: clear to auscultation bilaterally Back: symmetric, no curvature. ROM normal. No CVA  tenderness. Cardio: regular rate and rhythm, S1, S2 normal, no murmur, click, rub or gallop GI: soft, non-tender; bowel sounds normal; no masses,  no organomegaly Extremities: extremities normal, atraumatic, no cyanosis or edema Neurologic: Alert and oriented X 3, normal strength and tone. Normal symmetric reflexes. Normal coordination and gait   ECOG PERFORMANCE STATUS: 1 - Symptomatic but completely ambulatory   Blood pressure (!) 146/66, pulse 99, temperature 97.7 F (36.5 C), temperature source Oral, resp. rate 16, weight 168 lb 8 oz (76.4 kg), SpO2 98 %.  LABORATORY DATA: Lab Results  Component Value Date   WBC 13.4 (H) 11/21/2022   HGB 11.7 (L) 11/21/2022   HCT 37.2 11/21/2022   MCV 98.4 11/21/2022   PLT 318 11/21/2022      Chemistry      Component Value Date/Time   NA 140 11/03/2022 0844   NA 139 08/14/2017 0837   K 4.0 11/03/2022 0844   K 4.4 08/14/2017 0837   CL 99 11/03/2022 0844   CO2 25 11/03/2022 0844   CO2 24 08/14/2017 0837   BUN 33 (H) 11/03/2022 0844   BUN 13.3 08/14/2017 0837   CREATININE 0.91 11/03/2022 0844   CREATININE 0.79 10/31/2022 1056   CREATININE 0.8 08/14/2017 0837  Component Value Date/Time   CALCIUM 8.9 11/03/2022 0844   CALCIUM 9.3 08/14/2017 0837   ALKPHOS 102 11/03/2022 0844   ALKPHOS 107 08/14/2017 0837   AST 10 11/03/2022 0844   AST 9 (L) 10/31/2022 1056   AST 12 08/14/2017 0837   ALT 9 11/03/2022 0844   ALT 9 10/31/2022 1056   ALT 12 08/14/2017 0837   BILITOT 0.3 11/03/2022 0844   BILITOT 0.3 10/31/2022 1056   BILITOT 0.37 08/14/2017 0837       RADIOGRAPHIC STUDIES: CT CHEST ABDOMEN PELVIS W CONTRAST  Result Date: 11/16/2022 CLINICAL DATA:  Non-small cell lung cancer. History of brain cancer and cervical cancer. * Tracking Code: BO * EXAM: CT CHEST, ABDOMEN, AND PELVIS WITH CONTRAST TECHNIQUE: Multidetector CT imaging of the chest, abdomen and pelvis was performed following the standard protocol during bolus  administration of intravenous contrast. RADIATION DOSE REDUCTION: This exam was performed according to the departmental dose-optimization program which includes automated exposure control, adjustment of the mA and/or kV according to patient size and/or use of iterative reconstruction technique. CONTRAST:  13mL ISOVUE-300 IOPAMIDOL (ISOVUE-300) INJECTION 61% COMPARISON:  08/24/2022. FINDINGS: CT CHEST FINDINGS Cardiovascular: Atherosclerotic calcification of the aorta. Heart size normal. No pericardial effusion. Mediastinum/Nodes: No pathologically enlarged mediastinal, hilar or axillary lymph nodes. Esophagus is grossly unremarkable. Lungs/Pleura: Centrilobular and paraseptal emphysema. Post treatment consolidation in the apical segment right upper lobe, unchanged. Left apical pleuroparenchymal scarring. Pure ground-glass nodules in the posterolateral right lower lobe measure up to 6 mm (6/98), unchanged. No pleural fluid. Airway is unremarkable. Musculoskeletal: Degenerative changes in the spine. No worrisome lytic or sclerotic lesions. CT ABDOMEN PELVIS FINDINGS Hepatobiliary: Liver and gallbladder are unremarkable. No biliary ductal dilatation. Pancreas: Negative. Spleen: Negative. Adrenals/Urinary Tract: Adrenal glands and right kidney are unremarkable. Subcentimeter low-attenuation lesion in the left kidney, too small to characterize. No specific follow-up necessary. Ureters are decompressed. Bladder is low in volume. Locule of air in the bladder may be iatrogenic. Stomach/Bowel: Stomach, small bowel, appendix and colon are unremarkable. Anastomosis is seen in the rectosigmoid colon. Vascular/Lymphatic: Atherosclerotic calcification of the aorta. No pathologically enlarged lymph nodes. Reproductive: Hysterectomy.  No adnexal mass. Other: No free fluid. Mesenteries and peritoneum are unremarkable. Small umbilical hernia contains fat. Musculoskeletal: Degenerative changes in the spine. No worrisome lytic or  sclerotic lesions. Mild levoconvex scoliosis of the lumbar spine. IMPRESSION: 1. Post treatment scarring in the apical right upper lobe. No evidence of recurrent or metastatic disease. 2. Ground-glass nodules in the right lower lobe are stable. Recommend continued attention on follow-up. 3.  Aortic atherosclerosis (ICD10-I70.0). 4.  Emphysema (ICD10-J43.9). Electronically Signed   By: Lorin Picket M.D.   On: 11/16/2022 13:37    ASSESSMENT AND PLAN:  This is a very pleasant 72 years old white female with metastatic non-small cell lung cancer, adenocarcinoma status post induction systemic chemotherapy with carboplatin and Alimta with partial response.  The patient has no actionable EGFR or ALK mutations at the time of her diagnosis. The patient is currently on maintenance treatment with single agent Alimta status post 145 cycles. The patient has been tolerating this treatment fairly well with no concerning adverse effects. She had repeat CT scan of the chest, abdomen and pelvis performed recently.  I personally and independently reviewed the scan and discussed the result with the patient and her husband. I recommended for her to continue her maintenance treatment with Alimta and she will proceed with cycle #146 today. For the suspicious yeast vaginal infection, she was  advised to reach out to her primary care physician or urology for evaluation. I will see her back for follow-up visit in 3 weeks for evaluation before starting the next cycle of her treatment. She was advised to call immediately if she has any other concerning symptoms in the interval. The patient voices understanding of current disease status and treatment options and is in agreement with the current care plan. All questions were answered. The patient knows to call the clinic with any problems, questions or concerns. We can certainly see the patient much sooner if necessary.  Disclaimer: This note was dictated with voice recognition  software. Similar sounding words can inadvertently be transcribed and may not be corrected upon review.

## 2022-11-21 NOTE — Patient Instructions (Addendum)
[name]  Discharge Instructions: Thank you for choosing [facility] to provide your oncology and hematology care.   If you have a lab appointment with the Cancer Center, please go directly to the Cancer Center and check in at the registration area.   Wear comfortable clothing and clothing appropriate for easy access to any Portacath or PICC line.   We strive to give you quality time with your provider. You may need to reschedule your appointment if you arrive late (15 or more minutes).  Arriving late affects you and other patients whose appointments are after yours.  Also, if you miss three or more appointments without notifying the office, you may be dismissed from the clinic at the provider's discretion.      For prescription refill requests, have your pharmacy contact our office and allow 72 hours for refills to be completed.    Today you received the following chemotherapy and/or immunotherapy agent: Pemetrexed (Alimta)   To help prevent nausea and vomiting after your treatment, we encourage you to take your nausea medication as directed.  BELOW ARE SYMPTOMS THAT SHOULD BE REPORTED IMMEDIATELY: *FEVER GREATER THAN 100.4 F (38 C) OR HIGHER *CHILLS OR SWEATING *NAUSEA AND VOMITING THAT IS NOT CONTROLLED WITH YOUR NAUSEA MEDICATION *UNUSUAL SHORTNESS OF BREATH *UNUSUAL BRUISING OR BLEEDING *URINARY PROBLEMS (pain or burning when urinating, or frequent urination) *BOWEL PROBLEMS (unusual diarrhea, constipation, pain near the anus) TENDERNESS IN MOUTH AND THROAT WITH OR WITHOUT PRESENCE OF ULCERS (sore throat, sores in mouth, or a toothache) UNUSUAL RASH, SWELLING OR PAIN  UNUSUAL VAGINAL DISCHARGE OR ITCHING   Items with * indicate a potential emergency and should be followed up as soon as possible or go to the Emergency Department if any problems should occur.  Please show the CHEMOTHERAPY ALERT CARD or IMMUNOTHERAPY ALERT CARD at  check-in to the Emergency Department and triage nurse.  Should you have questions after your visit or need to cancel or reschedule your appointment, please contact [name]  Dept: [phone]  and follow the prompts.  Office hours are 8:00 a.m. to 4:30 p.m. Monday - Friday. Please note that voicemails left after 4:00 p.m. may not be returned until the following business day.  We are closed weekends and major holidays. You have access to a nurse at all times for urgent questions. Please call the main number to the clinic Dept: [phone] and follow the prompts.   For any non-urgent questions, you may also contact your provider using MyChart. We now offer e-Visits for anyone 18 and older to request care online for non-urgent symptoms. For details visit [email or web address].   Also download the MyChart app! Go to the app store, search "MyChart", open the app, select [facility], and log in with your MyChart username and password.  Pemetrexed Injection What is this medication? PEMETREXED (PEM e TREX ed) treats some types of cancer. It works by slowing down the growth of cancer cells. This medicine may be used for other purposes; ask your health care provider or pharmacist if you have questions. COMMON BRAND NAME(S): Alimta, PEMFEXY What should I tell my care team before I take this medication? They need to know if you have any of these conditions: Infection, such as chickenpox, cold sores, or herpes Kidney disease Low blood cell levels (white cells, red cells, and platelets) Lung or breathing disease, such as asthma Radiation therapy An unusual or allergic reaction to   pemetrexed, other medications, foods, dyes, or preservatives If you or your partner are pregnant or trying to get pregnant Breast-feeding How should I use this medication? This medication is injected into a vein. It is given by your care team in a [name] or clinic setting. Talk to  your care team about the use of this medication in children. Special care may be needed. Overdosage: If you think you have taken too much of this medicine contact a poison control center or emergency room at once. NOTE: This medicine is only for you. Do not share this medicine with others. What if I miss a dose? Keep appointments for follow-up doses. It is important not to miss your dose. Call your care team if you are unable to keep an appointment. What may interact with this medication? Do not take this medication with any of the following: Live virus vaccines This medication may also interact with the following: Ibuprofen This list may not describe all possible interactions. Give your health care provider a list of all the medicines, herbs, non-prescription drugs, or dietary supplements you use. Also tell them if you smoke, drink alcohol, or use illegal drugs. Some items may interact with your medicine. What should I watch for while using this medication? Your condition will be monitored carefully while you are receiving this medication. This medication may make you feel generally unwell. This is not uncommon as chemotherapy can affect healthy cells as well as cancer cells. Report any side effects. Continue your course of treatment even though you feel ill unless your care team tells you to stop. This medication can cause serious side effects. To reduce the risk, your care team may give you other medications to take before receiving this one. Be sure to follow the directions from your care team. This medication can cause a rash or redness in areas of the body that have previously had radiation therapy. If you have had radiation therapy, tell your care team if you notice a rash in this area. This medication may increase your risk of getting an infection. Call your care team for advice if you get a fever, chills, sore throat, or other symptoms of a cold or flu. Do not treat yourself. Try to avoid  being around people who are sick. Be careful brushing or flossing your teeth or using a toothpick because you may get an infection or bleed more easily. If you have any dental work done, tell your dentist you are receiving this medication. Avoid taking medications that contain aspirin, acetaminophen, ibuprofen, naproxen, or ketoprofen unless instructed by your care team. These medications may hide a fever. Check with your care team if you have severe diarrhea, nausea, and vomiting, or if you sweat a lot. The loss of too much body fluid may make it dangerous for you to take this medication. Talk to your care team if you or your partner wish to become pregnant or think either of you might be pregnant. This medication can cause serious birth defects if taken during pregnancy and for 6 months after the last dose. A negative pregnancy test is required before starting this medication. A reliable form of contraception is recommended while taking this medication and for 6 months after the last dose. Talk to your care team about reliable forms of contraception. Do not father a child while taking this medication and for 3 months after the last dose. Use a condom while having sex during this time period. Do not breastfeed while taking this medication and   for 1 week after the last dose. This medication may cause infertility. Talk to your care team if you are concerned about your fertility. What side effects may I notice from receiving this medication? Side effects that you should report to your care team as soon as possible: Allergic reactions--skin rash, itching, hives, swelling of the face, lips, tongue, or throat Dry cough, shortness of breath or trouble breathing Infection--fever, chills, cough, sore throat, wounds that don't heal, pain or trouble when passing urine, general feeling of discomfort or being unwell Kidney injury--decrease in the amount of urine, swelling of the ankles, hands, or feet Low red blood  cell level--unusual weakness or fatigue, dizziness, headache, trouble breathing Redness, blistering, peeling, or loosening of the skin, including inside the mouth Unusual bruising or bleeding Side effects that usually do not require medical attention (report to your care team if they continue or are bothersome): Fatigue Loss of appetite Nausea Vomiting This list may not describe all possible side effects. Call your doctor for medical advice about side effects. You may report side effects to FDA at 1-800-FDA-1088. Where should I keep my medication? This medication is given in a [name] or clinic. It will not be stored at home. NOTE: This sheet is a summary. It may not cover all possible information. If you have questions about this medicine, talk to your doctor, pharmacist, or health care provider.  2023 Elsevier/Gold Standard (2021-12-20 00:00:00)   

## 2022-11-29 ENCOUNTER — Other Ambulatory Visit: Payer: Self-pay

## 2022-12-09 MED FILL — Dexamethasone Sodium Phosphate Inj 100 MG/10ML: INTRAMUSCULAR | Qty: 1 | Status: AC

## 2022-12-12 ENCOUNTER — Inpatient Hospital Stay: Payer: Medicare Other

## 2022-12-12 ENCOUNTER — Other Ambulatory Visit: Payer: Self-pay

## 2022-12-12 ENCOUNTER — Inpatient Hospital Stay: Payer: Medicare Other | Attending: Internal Medicine

## 2022-12-12 ENCOUNTER — Encounter: Payer: Self-pay | Admitting: Medical Oncology

## 2022-12-12 ENCOUNTER — Inpatient Hospital Stay (HOSPITAL_BASED_OUTPATIENT_CLINIC_OR_DEPARTMENT_OTHER): Payer: Medicare Other | Admitting: Internal Medicine

## 2022-12-12 VITALS — BP 146/64 | HR 105 | Temp 97.8°F | Resp 16 | Wt 169.8 lb

## 2022-12-12 VITALS — BP 127/55 | HR 88 | Resp 16

## 2022-12-12 DIAGNOSIS — Z79899 Other long term (current) drug therapy: Secondary | ICD-10-CM | POA: Diagnosis not present

## 2022-12-12 DIAGNOSIS — Z5111 Encounter for antineoplastic chemotherapy: Secondary | ICD-10-CM | POA: Insufficient documentation

## 2022-12-12 DIAGNOSIS — C7931 Secondary malignant neoplasm of brain: Secondary | ICD-10-CM | POA: Insufficient documentation

## 2022-12-12 DIAGNOSIS — C3411 Malignant neoplasm of upper lobe, right bronchus or lung: Secondary | ICD-10-CM

## 2022-12-12 LAB — CBC WITH DIFFERENTIAL (CANCER CENTER ONLY)
Abs Immature Granulocytes: 0.09 10*3/uL — ABNORMAL HIGH (ref 0.00–0.07)
Basophils Absolute: 0 10*3/uL (ref 0.0–0.1)
Basophils Relative: 0 %
Eosinophils Absolute: 0 10*3/uL (ref 0.0–0.5)
Eosinophils Relative: 0 %
HCT: 36.9 % (ref 36.0–46.0)
Hemoglobin: 11.7 g/dL — ABNORMAL LOW (ref 12.0–15.0)
Immature Granulocytes: 1 %
Lymphocytes Relative: 5 %
Lymphs Abs: 0.4 10*3/uL — ABNORMAL LOW (ref 0.7–4.0)
MCH: 30.6 pg (ref 26.0–34.0)
MCHC: 31.7 g/dL (ref 30.0–36.0)
MCV: 96.6 fL (ref 80.0–100.0)
Monocytes Absolute: 0.9 10*3/uL (ref 0.1–1.0)
Monocytes Relative: 11 %
Neutro Abs: 7.1 10*3/uL (ref 1.7–7.7)
Neutrophils Relative %: 83 %
Platelet Count: 297 10*3/uL (ref 150–400)
RBC: 3.82 MIL/uL — ABNORMAL LOW (ref 3.87–5.11)
RDW: 16.1 % — ABNORMAL HIGH (ref 11.5–15.5)
WBC Count: 8.5 10*3/uL (ref 4.0–10.5)
nRBC: 0 % (ref 0.0–0.2)

## 2022-12-12 LAB — CMP (CANCER CENTER ONLY)
ALT: 12 U/L (ref 0–44)
AST: 12 U/L — ABNORMAL LOW (ref 15–41)
Albumin: 4 g/dL (ref 3.5–5.0)
Alkaline Phosphatase: 104 U/L (ref 38–126)
Anion gap: 9 (ref 5–15)
BUN: 23 mg/dL (ref 8–23)
CO2: 25 mmol/L (ref 22–32)
Calcium: 9.8 mg/dL (ref 8.9–10.3)
Chloride: 106 mmol/L (ref 98–111)
Creatinine: 0.9 mg/dL (ref 0.44–1.00)
GFR, Estimated: >60 mL/min (ref >60)
Glucose, Bld: 164 mg/dL — ABNORMAL HIGH (ref 70–99)
Potassium: 4 mmol/L (ref 3.5–5.1)
Sodium: 140 mmol/L (ref 135–145)
Total Bilirubin: 0.4 mg/dL (ref 0.3–1.2)
Total Protein: 6.9 g/dL (ref 6.5–8.1)

## 2022-12-12 MED ORDER — SODIUM CHLORIDE 0.9 % IV SOLN
500.0000 mg/m2 | Freq: Once | INTRAVENOUS | Status: AC
  Administered 2022-12-12: 900 mg via INTRAVENOUS
  Filled 2022-12-12: qty 20

## 2022-12-12 MED ORDER — SODIUM CHLORIDE 0.9 % IV SOLN: Freq: Once | INTRAVENOUS | Status: AC

## 2022-12-12 MED ORDER — SODIUM CHLORIDE 0.9 % IV SOLN
10.0000 mg | Freq: Once | INTRAVENOUS | Status: AC
  Administered 2022-12-12: 10 mg via INTRAVENOUS
  Filled 2022-12-12: qty 10

## 2022-12-12 NOTE — Patient Instructions (Signed)
[facility]  Discharge Instructions: Thank you for choosing [facility] to provide your oncology and hematology care.   If you have a lab appointment with the Cancer Center, please go directly to the Cancer Center and check in at the registration area.   Wear comfortable clothing and clothing appropriate for easy access to any Portacath or PICC line.   We strive to give you quality time with your provider. You may need to reschedule your appointment if you arrive late (15 or more minutes).  Arriving late affects you and other patients whose appointments are after yours.  Also, if you miss three or more appointments without notifying the office, you may be dismissed from the clinic at the provider's discretion.      For prescription refill requests, have your pharmacy contact our office and allow 72 hours for refills to be completed.    Today you received the following chemotherapy and/or immunotherapy agent: Pemetrexed (Alimta)   To help prevent nausea and vomiting after your treatment, we encourage you to take your nausea medication as directed.  BELOW ARE SYMPTOMS THAT SHOULD BE REPORTED IMMEDIATELY: *FEVER GREATER THAN 100.4 F (38 C) OR HIGHER *CHILLS OR SWEATING *NAUSEA AND VOMITING THAT IS NOT CONTROLLED WITH YOUR NAUSEA MEDICATION *UNUSUAL SHORTNESS OF BREATH *UNUSUAL BRUISING OR BLEEDING *URINARY PROBLEMS (pain or burning when urinating, or frequent urination) *BOWEL PROBLEMS (unusual diarrhea, constipation, pain near the anus) TENDERNESS IN MOUTH AND THROAT WITH OR WITHOUT PRESENCE OF ULCERS (sore throat, sores in mouth, or a toothache) UNUSUAL RASH, SWELLING OR PAIN  UNUSUAL VAGINAL DISCHARGE OR ITCHING   Items with * indicate a potential emergency and should be followed up as soon as possible or go to the Emergency Department if any problems should occur.  Please show the CHEMOTHERAPY ALERT CARD or IMMUNOTHERAPY ALERT CARD at  check-in to the Emergency Department and triage nurse.  Should you have questions after your visit or need to cancel or reschedule your appointment, please contact [facility]  Dept: [phone]  and follow the prompts.  Office hours are 8:00 a.m. to 4:30 p.m. Monday - Friday. Please note that voicemails left after 4:00 p.m. may not be returned until the following business day.  We are closed weekends and major holidays. You have access to a nurse at all times for urgent questions. Please call the main number to the clinic Dept: [phone] and follow the prompts.   For any non-urgent questions, you may also contact your provider using MyChart. We now offer e-Visits for anyone 18 and older to request care online for non-urgent symptoms. For details visit [email or web address].   Also download the MyChart app! Go to the app store, search "MyChart", open the app, select [facility], and log in with your MyChart username and password.  Pemetrexed Injection What is this medication? PEMETREXED (PEM e TREX ed) treats some types of cancer. It works by slowing down the growth of cancer cells. This medicine may be used for other purposes; ask your health care provider or pharmacist if you have questions. COMMON BRAND NAME(S): Alimta, PEMFEXY What should I tell my care team before I take this medication? They need to know if you have any of these conditions: Infection, such as chickenpox, cold sores, or herpes Kidney disease Low blood cell levels (white cells, red cells, and platelets) Lung or breathing disease, such as asthma Radiation therapy An unusual or allergic reaction to   pemetrexed, other medications, foods, dyes, or preservatives If you or your partner are pregnant or trying to get pregnant Breast-feeding How should I use this medication? This medication is injected into a vein. It is given by your care team in a hospital or clinic setting. Talk to  your care team about the use of this medication in children. Special care may be needed. Overdosage: If you think you have taken too much of this medicine contact a poison control center or emergency room at once. NOTE: This medicine is only for you. Do not share this medicine with others. What if I miss a dose? Keep appointments for follow-up doses. It is important not to miss your dose. Call your care team if you are unable to keep an appointment. What may interact with this medication? Do not take this medication with any of the following: Live virus vaccines This medication may also interact with the following: Ibuprofen This list may not describe all possible interactions. Give your health care provider a list of all the medicines, herbs, non-prescription drugs, or dietary supplements you use. Also tell them if you smoke, drink alcohol, or use illegal drugs. Some items may interact with your medicine. What should I watch for while using this medication? Your condition will be monitored carefully while you are receiving this medication. This medication may make you feel generally unwell. This is not uncommon as chemotherapy can affect healthy cells as well as cancer cells. Report any side effects. Continue your course of treatment even though you feel ill unless your care team tells you to stop. This medication can cause serious side effects. To reduce the risk, your care team may give you other medications to take before receiving this one. Be sure to follow the directions from your care team. This medication can cause a rash or redness in areas of the body that have previously had radiation therapy. If you have had radiation therapy, tell your care team if you notice a rash in this area. This medication may increase your risk of getting an infection. Call your care team for advice if you get a fever, chills, sore throat, or other symptoms of a cold or flu. Do not treat yourself. Try to avoid  being around people who are sick. Be careful brushing or flossing your teeth or using a toothpick because you may get an infection or bleed more easily. If you have any dental work done, tell your dentist you are receiving this medication. Avoid taking medications that contain aspirin, acetaminophen, ibuprofen, naproxen, or ketoprofen unless instructed by your care team. These medications may hide a fever. Check with your care team if you have severe diarrhea, nausea, and vomiting, or if you sweat a lot. The loss of too much body fluid may make it dangerous for you to take this medication. Talk to your care team if you or your partner wish to become pregnant or think either of you might be pregnant. This medication can cause serious birth defects if taken during pregnancy and for 6 months after the last dose. A negative pregnancy test is required before starting this medication. A reliable form of contraception is recommended while taking this medication and for 6 months after the last dose. Talk to your care team about reliable forms of contraception. Do not father a child while taking this medication and for 3 months after the last dose. Use a condom while having sex during this time period. Do not breastfeed while taking this medication and   for 1 week after the last dose. This medication may cause infertility. Talk to your care team if you are concerned about your fertility. What side effects may I notice from receiving this medication? Side effects that you should report to your care team as soon as possible: Allergic reactions--skin rash, itching, hives, swelling of the face, lips, tongue, or throat Dry cough, shortness of breath or trouble breathing Infection--fever, chills, cough, sore throat, wounds that don't heal, pain or trouble when passing urine, general feeling of discomfort or being unwell Kidney injury--decrease in the amount of urine, swelling of the ankles, hands, or feet Low red blood  cell level--unusual weakness or fatigue, dizziness, headache, trouble breathing Redness, blistering, peeling, or loosening of the skin, including inside the mouth Unusual bruising or bleeding Side effects that usually do not require medical attention (report to your care team if they continue or are bothersome): Fatigue Loss of appetite Nausea Vomiting This list may not describe all possible side effects. Call your doctor for medical advice about side effects. You may report side effects to FDA at 1-800-FDA-1088. Where should I keep my medication? This medication is given in a hospital or clinic. It will not be stored at home. NOTE: This sheet is a summary. It may not cover all possible information. If you have questions about this medicine, talk to your doctor, pharmacist, or health care provider.  2023 Elsevier/Gold Standard (2021-12-20 00:00:00)   

## 2022-12-12 NOTE — Progress Notes (Signed)
[facility] [phone]   [phone]  OFFICE PROGRESS NOTE  [name], NP [address] [place] [address]  DIAGNOSIS: Stage IV (T2a, N0, M1b) non-small cell lung cancer, adenocarcinoma with negative EGFR and ALK mutations diagnosed in January 2015 and presented with right upper lobe lung mass in addition to a solitary brain metastasis.  PRIOR THERAPY: 1) Status post stereotactic radiotherapy to a solitary right parietal brain lesion under the care of Dr. Mitzi Hansen on 10/16/2013. 2) Status post palliative radiotherapy to the right lung tumor under the care of Dr. Mitzi Hansen completed on 12/05/2013. 3) Systemic chemotherapy with carboplatin for AUC of 5 and Alimta 500 mg/M2 every 3 weeks. First dose Jan 06 2014. Status post 6 cycles.  CURRENT THERAPY: Systemic chemotherapy with maintenance Alimta 500 MG/M2 every 3 weeks, status post 146 cycles.  INTERVAL HISTORY: Alexis [name] 72 y.o. female returns to the clinic today for follow-up visit accompanied by her husband.  The patient is feeling fine today with no concerning complaints.  She denied having any chest pain, shortness of breath, cough or hemoptysis.  She has no nausea, vomiting, diarrhea or constipation.  She has no headache or visual changes.  She denied having any recent weight loss or night sweats.  She is here today for evaluation before starting cycle #147 of her treatment.  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  MEDICATIONS:  Current Outpatient Medications  Medication Sig Dispense Refill    acetaminophen (TYLENOL) 500 MG tablet Take 500 mg by mouth every 6 (six) hours as needed for mild pain or headache. Reported on 11/02/2015     ALPRAZolam (XANAX) 1 MG tablet TAKE 1/2 A TABLET (0.5 MG TOTAL) BY MOUTH 2 (TWO) TIMES DAILY AS NEEDED FOR ANXIETY. 30 tablet 2   Ascorbic Acid (VITAMIN C GUMMIE PO) Take 1 each by mouth every morning.     aspirin EC 81 MG tablet Take 1 tablet (81 mg total) by mouth daily. 150 tablet 2   Cranberry 240 MG CAPS Take 1 capsule by mouth daily. This is in her probiotic     [employer] ACID CONTROLLER 10 MG tablet SMARTSIG:1 Tablet(s) By Mouth Every 12 Hours PRN     folic acid (FOLVITE) 1 MG tablet TAKE 1 TABLET BY MOUTH EVERY DAY 90 tablet 0   glimepiride (AMARYL) 2 MG tablet TAKE 1 TABLET BY MOUTH DAILY BEFORE BREAKFAST. 90 tablet 0   loratadine (CLARITIN) 10 MG tablet Take 10 mg by mouth daily.     Multiple Vitamin (MULTIVITAMIN WITH MINERALS) TABS tablet Take 1 tablet by mouth every morning.     nitrofurantoin, macrocrystal-monohydrate, (MACROBID) 100 MG capsule Take 1 capsule (100 mg total) by mouth 2 (two) times daily. 14 capsule 0   omeprazole (PRILOSEC) 20 MG capsule TAKE 1 CAPSULE BY MOUTH EVERY DAY 90 capsule 0   ondansetron (ZOFRAN) 8 MG tablet TAKE 1 TABLET BY MOUTH BEFORE CHEMO 30 tablet 1   OVER THE COUNTER MEDICATION Take 1 tablet by mouth every morning. (  Vitamin A)     phenazopyridine (PYRIDIUM) 200 MG tablet Take 1 tablet (200 mg total) by mouth every 8 (eight) hours as needed. 10 tablet 0   prochlorperazine (COMPAZINE) 10 MG tablet Take 1 tablet (10 mg total) by mouth every 6 (six) hours as needed for nausea or vomiting. 60 tablet 0   rosuvastatin (CRESTOR) 10 MG tablet Take 1 tablet (10 mg total) by mouth daily. 30 tablet 12   senna-docusate (SENOKOT-S) 8.6-50 MG tablet Take 1 tablet by mouth daily. 30 tablet prn   Vitamin D, Ergocalciferol, (DRISDOL) 1.25 MG (50000 UNIT) CAPS capsule Take 50,000 Units by mouth once a week. Vit d 3     No current  facility-administered medications for this visit.    SURGICAL HISTORY:  Past Surgical History:  Procedure Laterality Date   ABDOMINAL HYSTERECTOMY     COLOSTOMY TAKEDOWN N/A 07/10/2014   Procedure: LAPAROSCOPIC LYSIS OF ADHESIONS (90 MIN) LAPAROSCOPIC ASSISTED COLOSTOMY CLOSURE, RIGID PROCTOSIGMOIDOSCOPY;  Surgeon: Avel Peace, MD;  Location: [facility];  Service: General;  Laterality: N/A;   LAPAROTOMY N/A 11/03/2013   Procedure: EXPLORATORY LAPAROTOMY, DRAINAGE OF INTRA  ABDOMINAL ABSCESSES, MOBILIZATION OF SPLENIC FLEXURE, SIGMOID COLECTOMY WITH COLOSTOMY;  Surgeon: Adolph Pollack, MD;  Location: [facility];  Service: General;  Laterality: N/A;   VIDEO BRONCHOSCOPY Bilateral 08/30/2013   Procedure: VIDEO BRONCHOSCOPY WITH FLUORO;  Surgeon: Nyoka Cowden, MD;  Location: [facility];  Service: Cardiopulmonary;  Laterality: Bilateral;    REVIEW OF SYSTEMS:  A comprehensive review of systems was negative.   PHYSICAL EXAMINATION: General appearance: alert, cooperative, and no distress Head: Normocephalic, without obvious abnormality, atraumatic Neck: no adenopathy, no JVD, supple, symmetrical, trachea midline, and thyroid not enlarged, symmetric, no tenderness/mass/nodules Lymph nodes: Cervical, supraclavicular, and axillary nodes normal. Resp: clear to auscultation bilaterally Back: symmetric, no curvature. ROM normal. No CVA tenderness. Cardio: regular rate and rhythm, S1, S2 normal, no murmur, click, rub or gallop GI: soft, non-tender; bowel sounds normal; no masses,  no organomegaly Extremities: extremities normal, atraumatic, no cyanosis or edema   ECOG PERFORMANCE STATUS: 1 - Symptomatic but completely ambulatory   Blood pressure (!) 146/64, pulse (!) 105, temperature 97.8 F (36.6 C), temperature source Oral, resp. rate 16, weight 169 lb 12.8 oz (77 kg), SpO2 100 %.  LABORATORY DATA: Lab Results  Component Value Date   WBC 8.5 12/12/2022   HGB 11.7 (L) 12/12/2022   HCT 36.9  12/12/2022   MCV 96.6 12/12/2022   PLT 297 12/12/2022      Chemistry      Component Value Date/Time   NA 138 11/21/2022 0951   NA 140 11/03/2022 0844   NA 139 08/14/2017 0837   K 4.4 11/21/2022 0951   K 4.4 08/14/2017 0837   CL 104 11/21/2022 0951   CO2 25 11/21/2022 0951   CO2 24 08/14/2017 0837   BUN 30 (H) 11/21/2022 0951   BUN 33 (H) 11/03/2022 0844   BUN 13.3 08/14/2017 0837   CREATININE 1.10 (H) 11/21/2022 0951   CREATININE 0.8 08/14/2017 0837      Component Value Date/Time   CALCIUM 9.9 11/21/2022 0951   CALCIUM 9.3 08/14/2017 0837   ALKPHOS 108 11/21/2022 0951   ALKPHOS 107 08/14/2017 0837   AST 11 (L) 11/21/2022 0951   AST 12 08/14/2017 0837   ALT 9 11/21/2022 0951   ALT 12 08/14/2017 0837   BILITOT 0.4 11/21/2022 0951   BILITOT 0.37 08/14/2017 0837       RADIOGRAPHIC  STUDIES: CT CHEST ABDOMEN PELVIS W CONTRAST  Result Date: 11/16/2022 CLINICAL DATA:  Non-small cell lung cancer. History of brain cancer and cervical cancer. * Tracking Code: BO * EXAM: CT CHEST, ABDOMEN, AND PELVIS WITH CONTRAST TECHNIQUE: Multidetector CT imaging of the chest, abdomen and pelvis was performed following the standard protocol during bolus administration of intravenous contrast. RADIATION DOSE REDUCTION: This exam was performed according to the departmental dose-optimization program which includes automated exposure control, adjustment of the mA and/or kV according to patient size and/or use of iterative reconstruction technique. CONTRAST:  ISOVUE-300 IOPAMIDOL (ISOVUE-300) INJECTION 61% COMPARISON:  08/24/2022. FINDINGS: CT CHEST FINDINGS Cardiovascular: Atherosclerotic calcification of the aorta. Heart size normal. No pericardial effusion. Mediastinum/Nodes: No pathologically enlarged mediastinal, hilar or axillary lymph nodes. Esophagus is grossly unremarkable. Lungs/Pleura: Centrilobular and paraseptal emphysema. Post treatment consolidation in the apical segment right upper  lobe, unchanged. Left apical pleuroparenchymal scarring. Pure ground-glass nodules in the posterolateral right lower lobe measure up to 6 mm (6/98), unchanged. No pleural fluid. Airway is unremarkable. Musculoskeletal: Degenerative changes in the spine. No worrisome lytic or sclerotic lesions. CT ABDOMEN PELVIS FINDINGS Hepatobiliary: Liver and gallbladder are unremarkable. No biliary ductal dilatation. Pancreas: Negative. Spleen: Negative. Adrenals/Urinary Tract: Adrenal glands and right kidney are unremarkable. Subcentimeter low-attenuation lesion in the left kidney, too small to characterize. No specific follow-up necessary. Ureters are decompressed. Bladder is low in volume. Locule of air in the bladder may be iatrogenic. Stomach/Bowel: Stomach, small bowel, appendix and colon are unremarkable. Anastomosis is seen in the rectosigmoid colon. Vascular/Lymphatic: Atherosclerotic calcification of the aorta. No pathologically enlarged lymph nodes. Reproductive: Hysterectomy.  No adnexal mass. Other: No free fluid. Mesenteries and peritoneum are unremarkable. Small umbilical hernia contains fat. Musculoskeletal: Degenerative changes in the spine. No worrisome lytic or sclerotic lesions. Mild levoconvex scoliosis of the lumbar spine. IMPRESSION: 1. Post treatment scarring in the apical right upper lobe. No evidence of recurrent or metastatic disease. 2. Ground-glass nodules in the right lower lobe are stable. Recommend continued attention on follow-up. 3.  Aortic atherosclerosis (ICD10-I70.0). 4.  Emphysema (ICD10-J43.9). Electronically Signed   By: Leanna Battles M.D.   On: 11/16/2022 13:37    ASSESSMENT AND PLAN:  This is a very pleasant 72 years old white female with metastatic non-small cell lung cancer, adenocarcinoma status post induction systemic chemotherapy with carboplatin and Alimta with partial response.  The patient has no actionable EGFR or ALK mutations at the time of her diagnosis. The patient is  currently on maintenance treatment with single agent Alimta status post 146 cycles. The patient has been tolerating this treatment well with no concerning adverse effects. I recommended for her to proceed with cycle #147 today as planned. I will see her back for follow-up visit in 3 weeks for evaluation before the next cycle of her treatment. She was advised to call immediately if she has any other concerning symptoms in the interval. The patient voices understanding of current disease status and treatment options and is in agreement with the current care plan. All questions were answered. The patient knows to call the clinic with any problems, questions or concerns. We can certainly see the patient much sooner if necessary.  Disclaimer: This note was dictated with voice recognition software. Similar sounding words can inadvertently be transcribed and may not be corrected upon review.

## 2022-12-12 NOTE — Progress Notes (Unsigned)
Patient seen by Dr. Gypsy Balsam are are not within treatment parameters -Per Dr Arbutus Ped it is ok to treat pt today with Pemetrexed and heart rate of 105.  Labs reviewed: and are within treatment parameters.  Per physician team, patient is ready for treatment and there are NO modifications to the treatment plan.

## 2022-12-20 ENCOUNTER — Other Ambulatory Visit: Payer: Self-pay

## 2022-12-24 DIAGNOSIS — N3001 Acute cystitis with hematuria: Secondary | ICD-10-CM | POA: Diagnosis not present

## 2022-12-24 DIAGNOSIS — N3091 Cystitis, unspecified with hematuria: Secondary | ICD-10-CM | POA: Diagnosis not present

## 2022-12-26 ENCOUNTER — Telehealth: Payer: Self-pay | Admitting: *Deleted

## 2022-12-26 NOTE — Telephone Encounter (Signed)
I'm sorry she feels that way. This is the first time I have seen the documentation of the phone call from 11/18/2022. Is there a reason she didn't call back a second time?

## 2022-12-26 NOTE — Telephone Encounter (Signed)
Pt calling to say that she is very disappointed and that she will be changing providers.  She stated that she called to request some more medication to be call in and that she never heard from anyone and she said she was in so much pain and that she had to seek care from someone else.  I did not see anymore communication after that . I confirmed with pt that she wanted me to remove provider as PCP and cancel her upcoming appointment in June and she said yes.  All of this has been completed.

## 2022-12-30 DIAGNOSIS — R300 Dysuria: Secondary | ICD-10-CM | POA: Diagnosis not present

## 2022-12-30 DIAGNOSIS — N952 Postmenopausal atrophic vaginitis: Secondary | ICD-10-CM | POA: Diagnosis not present

## 2022-12-30 DIAGNOSIS — N3020 Other chronic cystitis without hematuria: Secondary | ICD-10-CM | POA: Diagnosis not present

## 2022-12-30 MED FILL — Dexamethasone Sodium Phosphate Inj 100 MG/10ML: INTRAMUSCULAR | Qty: 1 | Status: AC

## 2023-01-02 ENCOUNTER — Encounter: Payer: Self-pay | Admitting: Internal Medicine

## 2023-01-02 ENCOUNTER — Inpatient Hospital Stay (HOSPITAL_BASED_OUTPATIENT_CLINIC_OR_DEPARTMENT_OTHER): Payer: Medicare Other | Admitting: Internal Medicine

## 2023-01-02 ENCOUNTER — Inpatient Hospital Stay: Payer: Medicare Other

## 2023-01-02 ENCOUNTER — Encounter: Payer: Self-pay | Admitting: Medical Oncology

## 2023-01-02 ENCOUNTER — Inpatient Hospital Stay: Payer: Medicare Other | Attending: Internal Medicine

## 2023-01-02 ENCOUNTER — Other Ambulatory Visit: Payer: Self-pay

## 2023-01-02 DIAGNOSIS — N390 Urinary tract infection, site not specified: Secondary | ICD-10-CM | POA: Diagnosis not present

## 2023-01-02 DIAGNOSIS — Z923 Personal history of irradiation: Secondary | ICD-10-CM | POA: Diagnosis not present

## 2023-01-02 DIAGNOSIS — C3411 Malignant neoplasm of upper lobe, right bronchus or lung: Secondary | ICD-10-CM

## 2023-01-02 DIAGNOSIS — Z79899 Other long term (current) drug therapy: Secondary | ICD-10-CM | POA: Insufficient documentation

## 2023-01-02 DIAGNOSIS — Z5111 Encounter for antineoplastic chemotherapy: Secondary | ICD-10-CM | POA: Diagnosis not present

## 2023-01-02 DIAGNOSIS — C7931 Secondary malignant neoplasm of brain: Secondary | ICD-10-CM | POA: Insufficient documentation

## 2023-01-02 LAB — CMP (CANCER CENTER ONLY)
ALT: 9 U/L (ref 0–44)
AST: 12 U/L — ABNORMAL LOW (ref 15–41)
Albumin: 4 g/dL (ref 3.5–5.0)
Alkaline Phosphatase: 91 U/L (ref 38–126)
Anion gap: 8 (ref 5–15)
BUN: 25 mg/dL — ABNORMAL HIGH (ref 8–23)
CO2: 25 mmol/L (ref 22–32)
Calcium: 9.7 mg/dL (ref 8.9–10.3)
Chloride: 106 mmol/L (ref 98–111)
Creatinine: 0.93 mg/dL (ref 0.44–1.00)
GFR, Estimated: >60 mL/min (ref >60)
Glucose, Bld: 174 mg/dL — ABNORMAL HIGH (ref 70–99)
Potassium: 4.4 mmol/L (ref 3.5–5.1)
Sodium: 139 mmol/L (ref 135–145)
Total Bilirubin: 0.3 mg/dL (ref 0.3–1.2)
Total Protein: 7 g/dL (ref 6.5–8.1)

## 2023-01-02 LAB — CBC WITH DIFFERENTIAL (CANCER CENTER ONLY)
Abs Immature Granulocytes: 0.06 10*3/uL (ref 0.00–0.07)
Basophils Absolute: 0 10*3/uL (ref 0.0–0.1)
Basophils Relative: 0 %
Eosinophils Absolute: 0 10*3/uL (ref 0.0–0.5)
Eosinophils Relative: 0 %
HCT: 37.7 % (ref 36.0–46.0)
Hemoglobin: 11.8 g/dL — ABNORMAL LOW (ref 12.0–15.0)
Immature Granulocytes: 1 %
Lymphocytes Relative: 4 %
Lymphs Abs: 0.4 10*3/uL — ABNORMAL LOW (ref 0.7–4.0)
MCH: 30.6 pg (ref 26.0–34.0)
MCHC: 31.3 g/dL (ref 30.0–36.0)
MCV: 97.9 fL (ref 80.0–100.0)
Monocytes Absolute: 0.3 10*3/uL (ref 0.1–1.0)
Monocytes Relative: 4 %
Neutro Abs: 8.7 10*3/uL — ABNORMAL HIGH (ref 1.7–7.7)
Neutrophils Relative %: 91 %
Platelet Count: 463 10*3/uL — ABNORMAL HIGH (ref 150–400)
RBC: 3.85 MIL/uL — ABNORMAL LOW (ref 3.87–5.11)
RDW: 16.5 % — ABNORMAL HIGH (ref 11.5–15.5)
WBC Count: 9.5 10*3/uL (ref 4.0–10.5)
nRBC: 0 % (ref 0.0–0.2)

## 2023-01-02 MED ORDER — SODIUM CHLORIDE 0.9 % IV SOLN
10.0000 mg | Freq: Once | INTRAVENOUS | Status: AC
  Administered 2023-01-02: 10 mg via INTRAVENOUS
  Filled 2023-01-02: qty 10

## 2023-01-02 MED ORDER — SODIUM CHLORIDE 0.9 % IV SOLN: Freq: Once | INTRAVENOUS | Status: AC

## 2023-01-02 MED ORDER — CYANOCOBALAMIN 1000 MCG/ML IJ SOLN
1000.0000 ug | Freq: Once | INTRAMUSCULAR | Status: AC
  Administered 2023-01-02: 1000 ug via INTRAMUSCULAR
  Filled 2023-01-02: qty 1

## 2023-01-02 MED ORDER — SODIUM CHLORIDE 0.9 % IV SOLN
500.0000 mg/m2 | Freq: Once | INTRAVENOUS | Status: AC
  Administered 2023-01-02: 900 mg via INTRAVENOUS
  Filled 2023-01-02: qty 20

## 2023-01-02 NOTE — Patient Instructions (Signed)
[facility]  Discharge Instructions: Thank you for choosing [facility] to provide your oncology and hematology care.   If you have a lab appointment with the Cancer Center, please go directly to the Cancer Center and check in at the registration area.   Wear comfortable clothing and clothing appropriate for easy access to any Portacath or PICC line.   We strive to give you quality time with your provider. You may need to reschedule your appointment if you arrive late (15 or more minutes).  Arriving late affects you and other patients whose appointments are after yours.  Also, if you miss three or more appointments without notifying the office, you may be dismissed from the clinic at the provider's discretion.      For prescription refill requests, have your pharmacy contact our office and allow 72 hours for refills to be completed.    Today you received the following chemotherapy and/or immunotherapy agents: Alimta.       To help prevent nausea and vomiting after your treatment, we encourage you to take your nausea medication as directed.  BELOW ARE SYMPTOMS THAT SHOULD BE REPORTED IMMEDIATELY: *FEVER GREATER THAN 100.4 F (38 C) OR HIGHER *CHILLS OR SWEATING *NAUSEA AND VOMITING THAT IS NOT CONTROLLED WITH YOUR NAUSEA MEDICATION *UNUSUAL SHORTNESS OF BREATH *UNUSUAL BRUISING OR BLEEDING *URINARY PROBLEMS (pain or burning when urinating, or frequent urination) *BOWEL PROBLEMS (unusual diarrhea, constipation, pain near the anus) TENDERNESS IN MOUTH AND THROAT WITH OR WITHOUT PRESENCE OF ULCERS (sore throat, sores in mouth, or a toothache) UNUSUAL RASH, SWELLING OR PAIN  UNUSUAL VAGINAL DISCHARGE OR ITCHING   Items with * indicate a potential emergency and should be followed up as soon as possible or go to the Emergency Department if any problems should occur.  Please show the CHEMOTHERAPY ALERT CARD or IMMUNOTHERAPY ALERT CARD at  check-in to the Emergency Department and triage nurse.  Should you have questions after your visit or need to cancel or reschedule your appointment, please contact [facility]  Dept: [phone]  and follow the prompts.  Office hours are 8:00 a.m. to 4:30 p.m. Monday - Friday. Please note that voicemails left after 4:00 p.m. may not be returned until the following business day.  We are closed weekends and major holidays. You have access to a nurse at all times for urgent questions. Please call the main number to the clinic Dept: [phone] and follow the prompts.   For any non-urgent questions, you may also contact your provider using MyChart. We now offer e-Visits for anyone 18 and older to request care online for non-urgent symptoms. For details visit [email or web address].   Also download the MyChart app! Go to the app store, search "MyChart", open the app, select [facility], and log in with your MyChart username and password.   

## 2023-01-02 NOTE — Progress Notes (Signed)
Patient seen by Dr. [name]  Vitals are within treatment parameters.  Labs reviewed: and are within treatment parameters.  Per physician team, patient is ready for treatment and there are NO modifications to the treatment plan.  

## 2023-01-02 NOTE — Progress Notes (Signed)
[facility] [phone]   [phone]  OFFICE PROGRESS NOTE  [name], MD [address] [place] [address]  DIAGNOSIS: Stage IV (T2a, N0, M1b) non-small cell lung cancer, adenocarcinoma with negative EGFR and ALK mutations diagnosed in January 2015 and presented with right upper lobe lung mass in addition to a solitary brain metastasis.  PRIOR THERAPY: 1) Status post stereotactic radiotherapy to a solitary right parietal brain lesion under the care of Dr. Mitzi Hansen on 10/16/2013. 2) Status post palliative radiotherapy to the right lung tumor under the care of Dr. Mitzi Hansen completed on 12/05/2013. 3) Systemic chemotherapy with carboplatin for AUC of 5 and Alimta 500 mg/M2 every 3 weeks. First dose Jan 06 2014. Status post 6 cycles.  CURRENT THERAPY: Systemic chemotherapy with maintenance Alimta 500 MG/M2 every 3 weeks, status post 147 cycles.  INTERVAL HISTORY: Alexis [name] 72 y.o. female is to the clinic today for follow-up visit accompanied by her husband.  The patient was recently treated for urinary tract infection by urology.  She denied having any current chest pain, shortness of breath, cough or hemoptysis.  She has no nausea, vomiting, diarrhea or constipation.  She has no headache or visual changes.  She continues to tolerate her treatment with maintenance Alimta fairly well.  She is here today for evaluation before starting cycle #148.   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  MEDICATIONS:  Current Outpatient Medications  Medication Sig  Dispense Refill   acetaminophen (TYLENOL) 500 MG tablet Take 500 mg by mouth every 6 (six) hours as needed for mild pain or headache. Reported on 11/02/2015     ALPRAZolam (XANAX) 1 MG tablet TAKE 1/2 A TABLET (0.5 MG TOTAL) BY MOUTH 2 (TWO) TIMES DAILY AS NEEDED FOR ANXIETY. 30 tablet 2   Ascorbic Acid (VITAMIN C GUMMIE PO) Take 1 each by mouth every morning.     aspirin EC 81 MG tablet Take 1 tablet (81 mg total) by mouth daily. 150 tablet 2   Cranberry 240 MG CAPS Take 1 capsule by mouth daily. This is in her probiotic     [employer] ACID CONTROLLER 10 MG tablet SMARTSIG:1 Tablet(s) By Mouth Every 12 Hours PRN     folic acid (FOLVITE) 1 MG tablet TAKE 1 TABLET BY MOUTH EVERY DAY 90 tablet 0   glimepiride (AMARYL) 2 MG tablet TAKE 1 TABLET BY MOUTH DAILY BEFORE BREAKFAST. 90 tablet 0   loratadine (CLARITIN) 10 MG tablet Take 10 mg by mouth daily.     Multiple Vitamin (MULTIVITAMIN WITH MINERALS) TABS tablet Take 1 tablet by mouth every morning.     nitrofurantoin, macrocrystal-monohydrate, (MACROBID) 100 MG capsule Take 1 capsule (100 mg total) by mouth 2 (two) times daily. 14 capsule 0   omeprazole (PRILOSEC) 20 MG capsule TAKE 1 CAPSULE BY MOUTH EVERY DAY 90 capsule 0   ondansetron (ZOFRAN) 8 MG tablet TAKE 1 TABLET BY MOUTH BEFORE CHEMO 30 tablet 1   OVER THE COUNTER MEDICATION Take 1 tablet by mouth every morning. (Vitamin A)  phenazopyridine (PYRIDIUM) 200 MG tablet Take 1 tablet (200 mg total) by mouth every 8 (eight) hours as needed. 10 tablet 0   prochlorperazine (COMPAZINE) 10 MG tablet Take 1 tablet (10 mg total) by mouth every 6 (six) hours as needed for nausea or vomiting. 60 tablet 0   rosuvastatin (CRESTOR) 10 MG tablet Take 1 tablet (10 mg total) by mouth daily. 30 tablet 12   senna-docusate (SENOKOT-S) 8.6-50 MG tablet Take 1 tablet by mouth daily. 30 tablet prn   Vitamin D, Ergocalciferol, (DRISDOL) 1.25 MG (50000 UNIT) CAPS capsule Take 50,000 Units by mouth once a week. Vit d 3      No current facility-administered medications for this visit.    SURGICAL HISTORY:  Past Surgical History:  Procedure Laterality Date   ABDOMINAL HYSTERECTOMY     COLOSTOMY TAKEDOWN N/A 07/10/2014   Procedure: LAPAROSCOPIC LYSIS OF ADHESIONS (90 MIN) LAPAROSCOPIC ASSISTED COLOSTOMY CLOSURE, RIGID PROCTOSIGMOIDOSCOPY;  Surgeon: Avel Peace, MD;  Location: [facility];  Service: General;  Laterality: N/A;   LAPAROTOMY N/A 11/03/2013   Procedure: EXPLORATORY LAPAROTOMY, DRAINAGE OF INTRA  ABDOMINAL ABSCESSES, MOBILIZATION OF SPLENIC FLEXURE, SIGMOID COLECTOMY WITH COLOSTOMY;  Surgeon: Adolph Pollack, MD;  Location: [facility];  Service: General;  Laterality: N/A;   VIDEO BRONCHOSCOPY Bilateral 08/30/2013   Procedure: VIDEO BRONCHOSCOPY WITH FLUORO;  Surgeon: Nyoka Cowden, MD;  Location: [facility];  Service: Cardiopulmonary;  Laterality: Bilateral;    REVIEW OF SYSTEMS:  A comprehensive review of systems was negative.   PHYSICAL EXAMINATION: General appearance: alert, cooperative, and no distress Head: Normocephalic, without obvious abnormality, atraumatic Neck: no adenopathy, no JVD, supple, symmetrical, trachea midline, and thyroid not enlarged, symmetric, no tenderness/mass/nodules Lymph nodes: Cervical, supraclavicular, and axillary nodes normal. Resp: clear to auscultation bilaterally Back: symmetric, no curvature. ROM normal. No CVA tenderness. Cardio: regular rate and rhythm, S1, S2 normal, no murmur, click, rub or gallop GI: soft, non-tender; bowel sounds normal; no masses,  no organomegaly Extremities: extremities normal, atraumatic, no cyanosis or edema   ECOG PERFORMANCE STATUS: 1 - Symptomatic but completely ambulatory   Blood pressure 117/67, pulse (!) 103, temperature 97.9 F (36.6 C), temperature source Oral, resp. rate 17, weight 168 lb 1.6 oz (76.2 kg), SpO2 99 %.  LABORATORY DATA: Lab Results  Component Value Date   WBC 8.5 12/12/2022   HGB 11.7 (L) 12/12/2022    HCT 36.9 12/12/2022   MCV 96.6 12/12/2022   PLT 297 12/12/2022      Chemistry      Component Value Date/Time   NA 140 12/12/2022 0931   NA 140 11/03/2022 0844   NA 139 08/14/2017 0837   K 4.0 12/12/2022 0931   K 4.4 08/14/2017 0837   CL 106 12/12/2022 0931   CO2 25 12/12/2022 0931   CO2 24 08/14/2017 0837   BUN 23 12/12/2022 0931   BUN 33 (H) 11/03/2022 0844   BUN 13.3 08/14/2017 0837   CREATININE 0.90 12/12/2022 0931   CREATININE 0.8 08/14/2017 0837      Component Value Date/Time   CALCIUM 9.8 12/12/2022 0931   CALCIUM 9.3 08/14/2017 0837   ALKPHOS 104 12/12/2022 0931   ALKPHOS 107 08/14/2017 0837   AST 12 (L) 12/12/2022 0931   AST 12 08/14/2017 0837   ALT 12 12/12/2022 0931   ALT 12 08/14/2017 0837   BILITOT 0.4 12/12/2022 0931   BILITOT 0.37 08/14/2017 0837       RADIOGRAPHIC STUDIES: No results found.  ASSESSMENT AND PLAN:  This is a very pleasant 72 years old white female with metastatic non-small cell lung cancer, adenocarcinoma status post induction systemic chemotherapy with carboplatin and Alimta with partial response.  The patient has no actionable EGFR or ALK mutations at the time of her diagnosis. The patient is currently on maintenance treatment with single agent Alimta status post 147 cycles. The patient continues to tolerate this treatment fairly well with no concerning adverse effects. I recommended for her to proceed with cycle #148 today. For urinary tract infection, she is currently under treatment by urology. I will see her back for follow-up visit in 3 weeks for evaluation before the next cycle of her treatment. The patient was advised to call immediately if she has any other concerning symptoms in the interval. The patient voices understanding of current disease status and treatment options and is in agreement with the current care plan. All questions were answered. The patient knows to call the clinic with any problems, questions or concerns. We  can certainly see the patient much sooner if necessary.  Disclaimer: This note was dictated with voice recognition software. Similar sounding words can inadvertently be transcribed and may not be corrected upon review.

## 2023-01-05 ENCOUNTER — Ambulatory Visit
Admission: RE | Admit: 2023-01-05 | Discharge: 2023-01-05 | Disposition: A | Discharge status: Home / Self Care | Payer: Medicare Other | Source: Ambulatory Visit | Attending: Radiation Oncology | Admitting: Radiation Oncology

## 2023-01-05 ENCOUNTER — Telehealth: Payer: Self-pay | Admitting: Internal Medicine

## 2023-01-05 DIAGNOSIS — C7931 Secondary malignant neoplasm of brain: Secondary | ICD-10-CM

## 2023-01-05 DIAGNOSIS — Z85118 Personal history of other malignant neoplasm of bronchus and lung: Secondary | ICD-10-CM | POA: Diagnosis not present

## 2023-01-05 MED ORDER — GADOPICLENOL 0.5 MMOL/ML IV SOLN
7.5000 mL | Freq: Once | INTRAVENOUS | Status: AC | PRN
  Administered 2023-01-05: 7.5 mL via INTRAVENOUS

## 2023-01-05 NOTE — Telephone Encounter (Signed)
Called patient regarding upcoming May and June appointments, patient is notified. ?

## 2023-01-08 ENCOUNTER — Other Ambulatory Visit: Payer: Self-pay

## 2023-01-09 ENCOUNTER — Inpatient Hospital Stay: Payer: Medicare Other

## 2023-01-09 ENCOUNTER — Ambulatory Visit
Admission: RE | Admit: 2023-01-09 | Discharge: 2023-01-09 | Disposition: A | Discharge status: Home / Self Care | Payer: Medicare Other | Source: Ambulatory Visit | Attending: Radiation Oncology | Admitting: Radiation Oncology

## 2023-01-09 ENCOUNTER — Encounter: Payer: Self-pay | Admitting: Radiation Oncology

## 2023-01-09 DIAGNOSIS — C3411 Malignant neoplasm of upper lobe, right bronchus or lung: Secondary | ICD-10-CM | POA: Diagnosis not present

## 2023-01-09 DIAGNOSIS — C7931 Secondary malignant neoplasm of brain: Secondary | ICD-10-CM | POA: Diagnosis not present

## 2023-01-09 NOTE — Addendum Note (Signed)
Encounter addended by: Birdena Crandall, LPN on: 12/05/8117 12:02 PM  Actions taken: Vitals modified, Medication List reviewed, Problem List reviewed, Allergies reviewed, Order Reconciliation Section accessed, Flowsheet accepted, Clinical Note Signed

## 2023-01-09 NOTE — Progress Notes (Signed)
Telephone nursing appointment for patient to review most recent scan results from 01/06/2023. I verified patient's identity and began nursing interview. Patient denies headaches or pain and reports doing well. No issues conveyed at this time.   Meaningful use complete.   Patient aware of their 2:30pm telephone appointment w/ Laurence Aly PA-C. I left my extension [phone] in case patient needs anything. Patient verbalized understanding. This concludes the nursing interview.   Patient contact [phone]     [name], LPN

## 2023-01-09 NOTE — Progress Notes (Signed)
Radiation Oncology         [phone] ________________________________  Outpatient Follow Up- Conducted via telephone at patient request.  I spoke with the patient to conduct this consult visit via telephone. The patient was notified in advance and was offered an in person or telemedicine meeting to allow for face to face communication but instead preferred to proceed with a telephone visit.  Name: Alexis [name] MRN: [id number]  Date: 01/09/2023  DOB: Oct 17, 1950  CC: [name], MD  Aida Puffer, MD  Diagnosis:  Stage IV (T2a, N0, M1b) non-small cell lung cancer of the right upper lobe consistent with adenocarcinoma with brain metastasis at presentation.  Prior Radiation:    10/28/2013 through 12/05/2013:   The patient was treated to the right lung tumor to a dose of 50 gray in 25 fractions using a 3-D conformal technique. Daily image guidance was used for the patient's treatment.  10/16/2013 SRS Treatment: PTV1: Rt Parietal 9mm target was treated using 3 Arcs to a prescription dose of 20 Gy. ExacTrac Snap verification was performed for each couch angle.  Narrative:     In summary this is a 72 y.o. patient with a history of metastatic lung cancer to the brain who was treated in two sessions in 2015 for her brain disease, followed by local control to the right lung.  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 She is no longer taking these medications.  She remains on maintenance Alimta with Dr. Arbutus Ped every 3 weeks and has had a total of 148 cycles.   Her last MRI brain on 01/05/23 showed stable changes in the right parietal lesion with less associated flair signal than her scan in November 2023, and no new disease. She's contacted today to review these results.    ROS: The patient is doing quite well and feels like her last chemo infusion went so smoothly and she didn't have any trouble with it. She is happy that  she's adopted another shitzu puppy and is staying active with this addition to her family. No complaints of  headaches, visual or auditory changes or concerns with movement or dizziness are verbalized.   Past Medical History:  Past Medical History:  Diagnosis Date   Anxiety    Anxiety 06/20/2016   Cervical cancer (HCC)    Diabetes mellitus without complication (HCC)    patient states she has type 2   Encounter for antineoplastic chemotherapy 07/20/2015   Malignant neoplasm of right upper lobe of lung (HCC)     non small cell lung cancer adenocarcioma with brain meta    Past Surgical History: Past Surgical History:  Procedure Laterality Date   ABDOMINAL HYSTERECTOMY     COLOSTOMY TAKEDOWN N/A 07/10/2014   Procedure: LAPAROSCOPIC LYSIS OF ADHESIONS (90 MIN) LAPAROSCOPIC ASSISTED COLOSTOMY CLOSURE, RIGID PROCTOSIGMOIDOSCOPY;  Surgeon: Avel Peace, MD;  Location: [facility];  Service: General;  Laterality: N/A;   LAPAROTOMY N/A 11/03/2013   Procedure: EXPLORATORY LAPAROTOMY, DRAINAGE OF INTRA  ABDOMINAL ABSCESSES, MOBILIZATION OF SPLENIC FLEXURE, SIGMOID COLECTOMY WITH COLOSTOMY;  Surgeon: Adolph Pollack, MD;  Location: [facility];  Service: General;  Laterality: N/A;   VIDEO BRONCHOSCOPY Bilateral 08/30/2013   Procedure: VIDEO BRONCHOSCOPY WITH FLUORO;  Surgeon: Nyoka Cowden, MD;  Location: [facility];  Service: Cardiopulmonary;  Laterality: Bilateral;    Social History:  Social History   Socioeconomic History   Marital status: Married    Spouse name: Not on  file   Number of children: Not on file   Years of education: Not on file   Highest education level: Not on file  Occupational History   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  Tobacco Use   Smoking status: Former    Packs/day: 1.00    Years: 40.00    Additional pack years: 0.00    Total pack years: 40.00    Types: Cigarettes    Quit date: 09/27/2013    Years since quitting: 9.2   Smokeless tobacco: Never   Vaping Use   Vaping Use: Never used  Substance and Sexual Activity   Alcohol use: No   Drug use: No   Sexual activity: Yes  Other Topics Concern   Not on file  Social History Narrative   Not on file   Social Determinants of Health   Financial Resource Strain: Low Risk  (11/10/2022)   Overall Financial Resource Strain (CARDIA)    Difficulty of Paying Living Expenses: Not hard at all  Food Insecurity: No Food Insecurity (11/10/2022)   Hunger Vital Sign    Worried About Running Out of Food in the Last Year: Never true    Ran Out of Food in the Last Year: Never true  Transportation Needs: No Transportation Needs (11/10/2022)   PRAPARE - Administrator, Civil Service (Medical): No    Lack of Transportation (Non-Medical): No  Physical Activity: Insufficiently Active (11/10/2022)   Exercise Vital Sign    Days of Exercise per Week: 1 day    Minutes of Exercise per Session: 20 min  Stress: Stress Concern Present (11/10/2022)   [employer] of Occupational Health - Occupational Stress Questionnaire    Feeling of Stress : To some extent  Social Connections: Moderately Isolated (11/10/2022)   Social Connection and Isolation Panel [NHANES]    Frequency of Communication with Friends and Family: Once a week    Frequency of Social Gatherings with Friends and Family: Once a week    Attends Religious Services: More than 4 times per year    Active Member of [employer] or Organizations: No    Attends Banker Meetings: Never    Marital Status: Married  Catering manager Violence: Not At Risk (11/10/2022)   Humiliation, Afraid, Rape, and Kick questionnaire    Fear of Current or Ex-Partner: No    Emotionally Abused: No    Physically Abused: No    Sexually Abused: No  The patient is married. She lives in [place], [place].  Family History: Family History  Problem Relation Age of Onset   Emphysema Father        smoked   Lung cancer Father        smoked   Cancer  Mother    Hypertension Mother    COPD Mother     ALLERGIES:  is allergic to glimepiride and codeine.  Meds: Current Outpatient Medications  Medication Sig Dispense Refill   acetaminophen (TYLENOL) 500 MG tablet Take 500 mg by mouth every 6 (six) hours as needed for mild pain or headache. Reported on 11/02/2015     ALPRAZolam (XANAX) 1 MG tablet TAKE 1/2 A TABLET (0.5 MG TOTAL) BY MOUTH 2 (TWO) TIMES DAILY AS NEEDED FOR ANXIETY. 30 tablet 2   Ascorbic Acid (VITAMIN C GUMMIE PO) Take 1 each by mouth every morning.     aspirin EC 81 MG tablet Take 1 tablet (81 mg total) by mouth daily. 150 tablet 2   Cranberry 240 MG  CAPS Take 1 capsule by mouth daily. This is in her probiotic     [employer] ACID CONTROLLER 10 MG tablet SMARTSIG:1 Tablet(s) By Mouth Every 12 Hours PRN     estradiol (ESTRACE) 0.1 MG/GM vaginal cream Place 1 Applicatorful vaginally daily.     folic acid (FOLVITE) 1 MG tablet TAKE 1 TABLET BY MOUTH EVERY DAY 90 tablet 0   glimepiride (AMARYL) 2 MG tablet TAKE 1 TABLET BY MOUTH DAILY BEFORE BREAKFAST. 90 tablet 0   loratadine (CLARITIN) 10 MG tablet Take 10 mg by mouth daily.     Multiple Vitamin (MULTIVITAMIN WITH MINERALS) TABS tablet Take 1 tablet by mouth every morning.     nitrofurantoin, macrocrystal-monohydrate, (MACROBID) 100 MG capsule Take 1 capsule (100 mg total) by mouth 2 (two) times daily. 14 capsule 0   omeprazole (PRILOSEC) 20 MG capsule TAKE 1 CAPSULE BY MOUTH EVERY DAY 90 capsule 0   ondansetron (ZOFRAN) 8 MG tablet TAKE 1 TABLET BY MOUTH BEFORE CHEMO 30 tablet 1   phenazopyridine (PYRIDIUM) 200 MG tablet Take 1 tablet (200 mg total) by mouth every 8 (eight) hours as needed. 10 tablet 0   prochlorperazine (COMPAZINE) 10 MG tablet Take 1 tablet (10 mg total) by mouth every 6 (six) hours as needed for nausea or vomiting. 60 tablet 0   rosuvastatin (CRESTOR) 10 MG tablet Take 1 tablet (10 mg total) by mouth daily. 30 tablet 12   senna-docusate (SENOKOT-S) 8.6-50 MG tablet  Take 1 tablet by mouth daily. 30 tablet prn   trimethoprim (TRIMPEX) 100 MG tablet Take 100 mg by mouth daily.     Vitamin D, Ergocalciferol, (DRISDOL) 1.25 MG (50000 UNIT) CAPS capsule Take 50,000 Units by mouth once a week. Vit d 3     No current facility-administered medications for this encounter.    Physical Findings: Unable to assess due to type of encounter  Lab Findings: Lab Results  Component Value Date   WBC 9.5 01/02/2023   HGB 11.8 (L) 01/02/2023   HCT 37.7 01/02/2023   MCV 97.9 01/02/2023   PLT 463 (H) 01/02/2023     Radiographic Findings: MR Brain W Wo Contrast  Result Date: 01/06/2023 CLINICAL DATA:  Brain metastases. History of lung cancer and radiation treatment to the brain. EXAM: MRI HEAD WITHOUT AND WITH CONTRAST TECHNIQUE: Multiplanar, multiecho pulse sequences of the brain and surrounding structures were obtained without and with intravenous contrast. CONTRAST:  7.5 cc Vueway COMPARISON:  Brain MRI 07/26/2022. FINDINGS: Brain: The heterogeneously peripherally enhancing lesion in the right parietal lobe measures up to approximately 1.4 cm x 1.0 cm in the axial plane and 1.2 cm x 0.9 cm in the coronal plane, not significantly changed since the prior study when measured again using similar technique (13-99, 15-15). A small amount of chronic blood products within the lesion are unchanged. FLAIR signal abnormality surrounding the lesion extending inferomedially through the white matter is decreased in the interim. There is no mass effect. A punctate lesion in the right cerebellar hemisphere without surrounding edema is unchanged going back to 2021 and may reflect a vascular structure (13-37). There are no new lesions. There is no acute intracranial hemorrhage, extra-axial fluid collection, or acute infarct. Parenchymal volume is normal. The ventricles are stable in size. Moderate background chronic small-vessel ischemic change and small remote lacunar infarcts in the basal  ganglia and hemispheric white matter are unchanged. The pituitary and suprasellar region are normal. There is no midline shift. Vascular: Normal flow voids. Skull and upper  cervical spine: Normal marrow signal. Sinuses/Orbits: The paranasal sinuses are clear. Bilateral lens implants are in place. The globes and orbits are otherwise unremarkable. Other: None. IMPRESSION: 1. Stable size and appearance of the peripherally enhancing lesion in the right parietal lobe with decreased surrounding FLAIR signal abnormality. 2. No new or progressive findings. Electronically Signed   By: Lesia Hausen M.D.   On: 01/06/2023 10:41     Impression/Plan: 1. 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 The patient continues to clinically and radiographically do quite well. She is interested in whether Dr. Mitzi Hansen would consider radiotherapy to her remaining RLL nodule that is 6 mm. She will continue systemic therapy with Dr. Arbutus Ped. We will plan a routine MRI in 6 months time.      This encounter was conducted via telephone.  The patient has provided two factor identification and has given verbal consent for this type of encounter and has been advised to only accept a meeting of this type in a secure network environment. The time spent during this encounter was 35 minutes including preparation, discussion, and coordination of the patient's care. The attendants for this meeting include  Ronny Bacon  and Caren Macadam and her husband Cynnamon Kenter.  During the encounter, Ronny Bacon was located remotely at home.  Caren Macadam was located at home with her husband Flarence Exantus.       Osker Mason, PAC

## 2023-01-10 ENCOUNTER — Other Ambulatory Visit: Payer: Self-pay | Admitting: Radiation Therapy

## 2023-01-10 DIAGNOSIS — C7931 Secondary malignant neoplasm of brain: Secondary | ICD-10-CM

## 2023-01-11 ENCOUNTER — Other Ambulatory Visit: Payer: Self-pay

## 2023-01-11 ENCOUNTER — Telehealth: Payer: Self-pay | Admitting: Radiation Oncology

## 2023-01-11 NOTE — Telephone Encounter (Signed)
Delayed entry- I left a voicemail for her explaining that at this time Dr. Mitzi Hansen does not recommend SBRT to the lung nodule, but we would revisit if this became more solid or concerning for progressive change.

## 2023-01-19 ENCOUNTER — Other Ambulatory Visit: Payer: Self-pay | Admitting: Radiation Therapy

## 2023-01-20 ENCOUNTER — Other Ambulatory Visit: Payer: Self-pay

## 2023-01-20 MED FILL — Dexamethasone Sodium Phosphate Inj 100 MG/10ML: INTRAMUSCULAR | Qty: 1 | Status: AC

## 2023-01-24 ENCOUNTER — Encounter: Payer: Self-pay | Admitting: Internal Medicine

## 2023-01-24 ENCOUNTER — Inpatient Hospital Stay: Payer: Medicare Other

## 2023-01-24 ENCOUNTER — Inpatient Hospital Stay (HOSPITAL_BASED_OUTPATIENT_CLINIC_OR_DEPARTMENT_OTHER): Payer: Medicare Other | Admitting: Internal Medicine

## 2023-01-24 VITALS — BP 120/56 | HR 97 | Temp 97.7°F | Resp 18 | Ht 64.0 in | Wt 167.9 lb

## 2023-01-24 DIAGNOSIS — N390 Urinary tract infection, site not specified: Secondary | ICD-10-CM | POA: Diagnosis not present

## 2023-01-24 DIAGNOSIS — C3490 Malignant neoplasm of unspecified part of unspecified bronchus or lung: Secondary | ICD-10-CM

## 2023-01-24 DIAGNOSIS — Z923 Personal history of irradiation: Secondary | ICD-10-CM | POA: Diagnosis not present

## 2023-01-24 DIAGNOSIS — C7931 Secondary malignant neoplasm of brain: Secondary | ICD-10-CM | POA: Diagnosis not present

## 2023-01-24 DIAGNOSIS — C3411 Malignant neoplasm of upper lobe, right bronchus or lung: Secondary | ICD-10-CM

## 2023-01-24 DIAGNOSIS — Z5111 Encounter for antineoplastic chemotherapy: Secondary | ICD-10-CM | POA: Diagnosis not present

## 2023-01-24 DIAGNOSIS — Z79899 Other long term (current) drug therapy: Secondary | ICD-10-CM | POA: Diagnosis not present

## 2023-01-24 LAB — CBC WITH DIFFERENTIAL (CANCER CENTER ONLY)
Abs Immature Granulocytes: 0.05 10*3/uL (ref 0.00–0.07)
Basophils Absolute: 0 10*3/uL (ref 0.0–0.1)
Basophils Relative: 0 %
Eosinophils Absolute: 0 10*3/uL (ref 0.0–0.5)
Eosinophils Relative: 0 %
HCT: 36.8 % (ref 36.0–46.0)
Hemoglobin: 11.9 g/dL — ABNORMAL LOW (ref 12.0–15.0)
Immature Granulocytes: 1 %
Lymphocytes Relative: 4 %
Lymphs Abs: 0.4 10*3/uL — ABNORMAL LOW (ref 0.7–4.0)
MCH: 31.1 pg (ref 26.0–34.0)
MCHC: 32.3 g/dL (ref 30.0–36.0)
MCV: 96.1 fL (ref 80.0–100.0)
Monocytes Absolute: 0.3 10*3/uL (ref 0.1–1.0)
Monocytes Relative: 3 %
Neutro Abs: 9 10*3/uL — ABNORMAL HIGH (ref 1.7–7.7)
Neutrophils Relative %: 92 %
Platelet Count: 279 10*3/uL (ref 150–400)
RBC: 3.83 MIL/uL — ABNORMAL LOW (ref 3.87–5.11)
RDW: 15.9 % — ABNORMAL HIGH (ref 11.5–15.5)
WBC Count: 9.7 10*3/uL (ref 4.0–10.5)
nRBC: 0 % (ref 0.0–0.2)

## 2023-01-24 LAB — CMP (CANCER CENTER ONLY)
ALT: 8 U/L (ref 0–44)
AST: 10 U/L — ABNORMAL LOW (ref 15–41)
Albumin: 4.1 g/dL (ref 3.5–5.0)
Alkaline Phosphatase: 92 U/L (ref 38–126)
Anion gap: 9 (ref 5–15)
BUN: 26 mg/dL — ABNORMAL HIGH (ref 8–23)
CO2: 24 mmol/L (ref 22–32)
Calcium: 9.8 mg/dL (ref 8.9–10.3)
Chloride: 106 mmol/L (ref 98–111)
Creatinine: 1 mg/dL (ref 0.44–1.00)
GFR, Estimated: >60 mL/min (ref >60)
Glucose, Bld: 156 mg/dL — ABNORMAL HIGH (ref 70–99)
Potassium: 4.4 mmol/L (ref 3.5–5.1)
Sodium: 139 mmol/L (ref 135–145)
Total Bilirubin: 0.5 mg/dL (ref 0.3–1.2)
Total Protein: 6.8 g/dL (ref 6.5–8.1)

## 2023-01-24 MED ORDER — SODIUM CHLORIDE 0.9 % IV SOLN
500.0000 mg/m2 | Freq: Once | INTRAVENOUS | Status: AC
  Administered 2023-01-24: 900 mg via INTRAVENOUS
  Filled 2023-01-24: qty 20

## 2023-01-24 MED ORDER — SODIUM CHLORIDE 0.9% FLUSH: 10.0000 mL | INTRAVENOUS | Status: DC | PRN

## 2023-01-24 MED ORDER — SODIUM CHLORIDE 0.9 % IV SOLN: Freq: Once | INTRAVENOUS | Status: AC

## 2023-01-24 MED ORDER — SODIUM CHLORIDE 0.9 % IV SOLN
10.0000 mg | Freq: Once | INTRAVENOUS | Status: AC
  Administered 2023-01-24: 10 mg via INTRAVENOUS
  Filled 2023-01-24: qty 10

## 2023-01-24 MED ORDER — HEPARIN SOD (PORK) LOCK FLUSH 100 UNIT/ML IV SOLN: 500.0000 [IU] | Freq: Once | INTRAVENOUS | Status: DC | PRN

## 2023-01-24 NOTE — Progress Notes (Signed)
[facility] Telephone:([phone]) [phone]   Fax:([phone]) [phone]  OFFICE PROGRESS NOTE  [name], MD [address] [place] [address]  DIAGNOSIS: Stage IV (T2a, N0, M1b) non-small cell lung cancer, adenocarcinoma with negative EGFR and ALK mutations diagnosed in January 2015 and presented with right upper lobe lung mass in addition to a solitary brain metastasis.  PRIOR THERAPY: 1) Status post stereotactic radiotherapy to a solitary right parietal brain lesion under the care of Dr. Mitzi Hansen on 10/16/2013. 2) Status post palliative radiotherapy to the right lung tumor under the care of Dr. Mitzi Hansen completed on 12/05/2013. 3) Systemic chemotherapy with carboplatin for AUC of 5 and Alimta 500 mg/M2 every 3 weeks. First dose Jan 06 2014. Status post 6 cycles.  CURRENT THERAPY: Systemic chemotherapy with maintenance Alimta 500 MG/M2 every 3 weeks, status post 148 cycles.  INTERVAL HISTORY: Alexis [name] 72 y.o. female returns to the clinic today for follow-up visit accompanied by her husband.  The patient is feeling fine today with no concerning complaints.  She was recently treated for urinary tract infection as well as yeast infection at the urgent care center and she is also followed by Dr. Annabell Howells from [facility] urology.  She denied having any current chest pain, shortness of breath, cough or hemoptysis.  She has no nausea, vomiting, diarrhea or constipation.  She has no headache or visual changes.  She is here today for evaluation before starting cycle #149.  MEDICAL HISTORY: Past Medical History:  Diagnosis Date   Anxiety    Anxiety 06/20/2016   Cervical cancer (HCC)    Diabetes mellitus without complication (HCC)    patient states she has type 2   Encounter for antineoplastic chemotherapy 07/20/2015   Malignant neoplasm of right upper lobe of lung (HCC)     non small cell lung cancer adenocarcioma with brain meta    ALLERGIES:  is allergic to glimepiride  and codeine.  MEDICATIONS:  Current Outpatient Medications  Medication Sig Dispense Refill   acetaminophen (TYLENOL) 500 MG tablet Take 500 mg by mouth every 6 (six) hours as needed for mild pain or headache. Reported on 11/02/2015     ALPRAZolam (XANAX) 1 MG tablet TAKE 1/2 A TABLET (0.5 MG TOTAL) BY MOUTH 2 (TWO) TIMES DAILY AS NEEDED FOR ANXIETY. 30 tablet 2   Ascorbic Acid (VITAMIN C GUMMIE PO) Take 1 each by mouth every morning.     aspirin EC 81 MG tablet Take 1 tablet (81 mg total) by mouth daily. 150 tablet 2   Cranberry 240 MG CAPS Take 1 capsule by mouth daily. This is in her probiotic     [employer] ACID CONTROLLER 10 MG tablet SMARTSIG:1 Tablet(s) By Mouth Every 12 Hours PRN     estradiol (ESTRACE) 0.1 MG/GM vaginal cream Place 1 Applicatorful vaginally daily.     folic acid (FOLVITE) 1 MG tablet TAKE 1 TABLET BY MOUTH EVERY DAY 90 tablet 0   glimepiride (AMARYL) 2 MG tablet TAKE 1 TABLET BY MOUTH DAILY BEFORE BREAKFAST. 90 tablet 0   loratadine (CLARITIN) 10 MG tablet Take 10 mg by mouth daily.     Multiple Vitamin (MULTIVITAMIN WITH MINERALS) TABS tablet Take 1 tablet by mouth every morning.     nitrofurantoin, macrocrystal-monohydrate, (MACROBID) 100 MG capsule Take 1 capsule (100 mg total) by mouth 2 (two) times daily. 14 capsule 0   omeprazole (PRILOSEC) 20 MG capsule TAKE 1 CAPSULE BY MOUTH EVERY DAY 90 capsule 0  ondansetron (ZOFRAN) 8 MG tablet TAKE 1 TABLET BY MOUTH BEFORE CHEMO 30 tablet 1   phenazopyridine (PYRIDIUM) 200 MG tablet Take 1 tablet (200 mg total) by mouth every 8 (eight) hours as needed. 10 tablet 0   prochlorperazine (COMPAZINE) 10 MG tablet Take 1 tablet (10 mg total) by mouth every 6 (six) hours as needed for nausea or vomiting. 60 tablet 0   rosuvastatin (CRESTOR) 10 MG tablet Take 1 tablet (10 mg total) by mouth daily. 30 tablet 12   senna-docusate (SENOKOT-S) 8.6-50 MG tablet Take 1 tablet by mouth daily. 30 tablet prn   trimethoprim (TRIMPEX) 100 MG tablet  Take 100 mg by mouth daily.     Vitamin D, Ergocalciferol, (DRISDOL) 1.25 MG (50000 UNIT) CAPS capsule Take 50,000 Units by mouth once a week. Vit d 3     No current facility-administered medications for this visit.    SURGICAL HISTORY:  Past Surgical History:  Procedure Laterality Date   ABDOMINAL HYSTERECTOMY     COLOSTOMY TAKEDOWN N/A 07/10/2014   Procedure: LAPAROSCOPIC LYSIS OF ADHESIONS (90 MIN) LAPAROSCOPIC ASSISTED COLOSTOMY CLOSURE, RIGID PROCTOSIGMOIDOSCOPY;  Surgeon: Avel Peace, MD;  Location: [facility];  Service: General;  Laterality: N/A;   LAPAROTOMY N/A 11/03/2013   Procedure: EXPLORATORY LAPAROTOMY, DRAINAGE OF INTRA  ABDOMINAL ABSCESSES, MOBILIZATION OF SPLENIC FLEXURE, SIGMOID COLECTOMY WITH COLOSTOMY;  Surgeon: Adolph Pollack, MD;  Location: [facility];  Service: General;  Laterality: N/A;   VIDEO BRONCHOSCOPY Bilateral 08/30/2013   Procedure: VIDEO BRONCHOSCOPY WITH FLUORO;  Surgeon: Nyoka Cowden, MD;  Location: [facility];  Service: Cardiopulmonary;  Laterality: Bilateral;    REVIEW OF SYSTEMS:  A comprehensive review of systems was negative.   PHYSICAL EXAMINATION: General appearance: alert, cooperative, and no distress Head: Normocephalic, without obvious abnormality, atraumatic Neck: no adenopathy, no JVD, supple, symmetrical, trachea midline, and thyroid not enlarged, symmetric, no tenderness/mass/nodules Lymph nodes: Cervical, supraclavicular, and axillary nodes normal. Resp: clear to auscultation bilaterally Back: symmetric, no curvature. ROM normal. No CVA tenderness. Cardio: regular rate and rhythm, S1, S2 normal, no murmur, click, rub or gallop GI: soft, non-tender; bowel sounds normal; no masses,  no organomegaly Extremities: extremities normal, atraumatic, no cyanosis or edema   ECOG PERFORMANCE STATUS: 1 - Symptomatic but completely ambulatory   Blood pressure (!) 120/56, pulse 97, temperature 97.7 F (36.5 C), resp. rate 18, height 5\' 4"  (1.626 m),  weight 167 lb 14.4 oz (76.2 kg), SpO2 98 %.  LABORATORY DATA: Lab Results  Component Value Date   WBC 9.5 01/02/2023   HGB 11.8 (L) 01/02/2023   HCT 37.7 01/02/2023   MCV 97.9 01/02/2023   PLT 463 (H) 01/02/2023      Chemistry      Component Value Date/Time   NA 139 01/02/2023 0921   NA 140 11/03/2022 0844   NA 139 08/14/2017 0837   K 4.4 01/02/2023 0921   K 4.4 08/14/2017 0837   CL 106 01/02/2023 0921   CO2 25 01/02/2023 0921   CO2 24 08/14/2017 0837   BUN 25 (H) 01/02/2023 0921   BUN 33 (H) 11/03/2022 0844   BUN 13.3 08/14/2017 0837   CREATININE 0.93 01/02/2023 0921   CREATININE 0.8 08/14/2017 0837      Component Value Date/Time   CALCIUM 9.7 01/02/2023 0921   CALCIUM 9.3 08/14/2017 0837   ALKPHOS 91 01/02/2023 0921   ALKPHOS 107 08/14/2017 0837   AST 12 (L) 01/02/2023 0921   AST 12 08/14/2017 0837   ALT 9  01/02/2023 0921   ALT 12 08/14/2017 0837   BILITOT 0.3 01/02/2023 0921   BILITOT 0.37 08/14/2017 0837       RADIOGRAPHIC STUDIES: MR Brain W Wo Contrast  Result Date: 01/06/2023 CLINICAL DATA:  Brain metastases. History of lung cancer and radiation treatment to the brain. EXAM: MRI HEAD WITHOUT AND WITH CONTRAST TECHNIQUE: Multiplanar, multiecho pulse sequences of the brain and surrounding structures were obtained without and with intravenous contrast. CONTRAST:  7.5 cc V[employer]way COMPARISON:  Brain MRI 07/26/2022. FINDINGS: Brain: The heterogeneously peripherally enhancing lesion in the right parietal lobe measures up to approximately 1.4 cm x 1.0 cm in the axial plane and 1.2 cm x 0.9 cm in the coronal plane, not significantly changed since the prior study when measured again using similar technique (13-99, 15-15). A small amount of chronic blood products within the lesion are unchanged. FLAIR signal abnormality surrounding the lesion extending inferomedially through the white matter is decreased in the interim. There is no mass effect. A punctate lesion in the right  cerebellar hemisphere without surrounding edema is unchanged going back to 2021 and may reflect a vascular structure (13-37). There are no new lesions. There is no acute intracranial hemorrhage, extra-axial fluid collection, or acute infarct. Parenchymal volume is normal. The ventricles are stable in size. Moderate background chronic small-vessel ischemic change and small remote lacunar infarcts in the basal ganglia and hemispheric white matter are unchanged. The pituitary and suprasellar region are normal. There is no midline shift. Vascular: Normal flow voids. Skull and upper cervical spine: Normal marrow signal. Sinuses/Orbits: The paranasal sinuses are clear. Bilateral lens implants are in place. The globes and orbits are otherwise unremarkable. Other: None. IMPRESSION: 1. Stable size and appearance of the peripherally enhancing lesion in the right parietal lobe with decreased surrounding FLAIR signal abnormality. 2. No new or progressive findings. Electronically Signed   By: Lesia Hausen M.D.   On: 01/06/2023 10:41    ASSESSMENT AND PLAN:  This is a very pleasant 72 years old white female with metastatic non-small cell lung cancer, adenocarcinoma status post induction systemic chemotherapy with carboplatin and Alimta with partial response.  The patient has no actionable EGFR or ALK mutations at the time of her diagnosis. The patient is currently on maintenance treatment with single agent Alimta status post 148 cycles. The patient has been tolerating this treatment well with no concerning adverse effects. I recommended for her to proceed with cycle #149 today as planned. I will arrange for her to have repeat CT scan of the chest, abdomen and pelvis before the next cycle of her treatment. For urinary tract infection, she is currently under treatment by urology. The patient was advised to call immediately if she has any concerning symptoms in the interval. The patient voices understanding of current  disease status and treatment options and is in agreement with the current care plan. All questions were answered. The patient knows to call the clinic with any problems, questions or concerns. We can certainly see the patient much sooner if necessary.  Disclaimer: This note was dictated with voice recognition software. Similar sounding words can inadvertently be transcribed and may not be corrected upon review.

## 2023-01-24 NOTE — Patient Instructions (Signed)
[facility]  Discharge Instructions: Thank you for choosing [facility] to provide your oncology and hematology care.   If you have a lab appointment with the Cancer Center, please go directly to the Cancer Center and check in at the registration area.   Wear comfortable clothing and clothing appropriate for easy access to any Portacath or PICC line.   We strive to give you quality time with your provider. You may need to reschedule your appointment if you arrive late (15 or more minutes).  Arriving late affects you and other patients whose appointments are after yours.  Also, if you miss three or more appointments without notifying the office, you may be dismissed from the clinic at the provider's discretion.      For prescription refill requests, have your pharmacy contact our office and allow 72 hours for refills to be completed.    Today you received the following chemotherapy and/or immunotherapy agents: Alimta.       To help prevent nausea and vomiting after your treatment, we encourage you to take your nausea medication as directed.  BELOW ARE SYMPTOMS THAT SHOULD BE REPORTED IMMEDIATELY: *FEVER GREATER THAN 100.4 F (38 C) OR HIGHER *CHILLS OR SWEATING *NAUSEA AND VOMITING THAT IS NOT CONTROLLED WITH YOUR NAUSEA MEDICATION *UNUSUAL SHORTNESS OF BREATH *UNUSUAL BRUISING OR BLEEDING *URINARY PROBLEMS (pain or burning when urinating, or frequent urination) *BOWEL PROBLEMS (unusual diarrhea, constipation, pain near the anus) TENDERNESS IN MOUTH AND THROAT WITH OR WITHOUT PRESENCE OF ULCERS (sore throat, sores in mouth, or a toothache) UNUSUAL RASH, SWELLING OR PAIN  UNUSUAL VAGINAL DISCHARGE OR ITCHING   Items with * indicate a potential emergency and should be followed up as soon as possible or go to the Emergency Department if any problems should occur.  Please show the CHEMOTHERAPY ALERT CARD or IMMUNOTHERAPY ALERT CARD at  check-in to the Emergency Department and triage nurse.  Should you have questions after your visit or need to cancel or reschedule your appointment, please contact [facility]  Dept: [phone]  and follow the prompts.  Office hours are 8:00 a.m. to 4:30 p.m. Monday - Friday. Please note that voicemails left after 4:00 p.m. may not be returned until the following business day.  We are closed weekends and major holidays. You have access to a nurse at all times for urgent questions. Please call the main number to the clinic Dept: [phone] and follow the prompts.   For any non-urgent questions, you may also contact your provider using MyChart. We now offer e-Visits for anyone 18 and older to request care online for non-urgent symptoms. For details visit [email or web address].   Also download the MyChart app! Go to the app store, search "MyChart", open the app, select [facility], and log in with your MyChart username and password.   

## 2023-02-07 NOTE — Progress Notes (Signed)
[facility] OFFICE PROGRESS NOTE  [name], MD [address] [place] [address]  DIAGNOSIS: Stage IV (T2a, N0, M1b) non-small cell lung cancer, adenocarcinoma with negative EGFR and ALK mutations diagnosed in January 2015 and presented with right upper lobe lung mass in addition to a solitary brain metastasis   PRIOR THERAPY: 1) Status post stereotactic radiotherapy to a solitary right parietal brain lesion under the care of Dr. Mitzi Hansen on 10/16/2013. 2) Status post palliative radiotherapy to the right lung tumor under the care of Dr. Mitzi Hansen completed on 12/05/2013. 3) Systemic chemotherapy with carboplatin for AUC of 5 and Alimta 500 mg/M2 every 3 weeks. First dose Jan 06 2014. Status post 6 cycles.    CURRENT THERAPY:  Systemic chemotherapy with maintenance Alimta 500 MG/M2 every 3 weeks, status post 149 cycles.   INTERVAL HISTORY: Alexis [name] 72 y.o. female returns to the clinic today for a follow up visit accompanied by her husband. The patient is feeling fairly well today with out any concerning complaints except she ran out of Xanax 5 weeks ago and she has been having trouble with insomnia recently.  She is in the process of establishing with a different primary care office.  She also mentions that last week she fell and has a large bruise on her left thigh and her chest is sore.  She had a follow-up restaging CT scan a few days later which did not mention any fractures.  She also mentions that she saw her urologist last week for recurrent urinary tract infections and is currently on antibiotics.    She saw radiation oncology recently to see if they would consider SBRT to a RLL lung lesion. They recommended monitoring for now and would revisit this if it became more solid or progressive. She is currently undergoing single agent chemotherapy with Alimta.  She tolerates this well.  Denies any fever, chills, or unexplained weight loss.  She sometimes gets  night sweats, especially last night when she took her steroid.  She took her steroid around 6 PM.  She also had significant insomnia last night.  Denies any chest pain, cough, or hemoptysis.  She denies any significant or worsening shortness of breath.  Denies any nausea or vomiting.  She sometimes has constipation once in awhile which depends on what she eats. Denies any headache or visual changes. The patient recently had a restaging CT scan of the CAP. She is here today for evaluation and to review her scan before starting cycle 150.      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  MEDICATIONS:  Current Outpatient Medications  Medication Sig Dispense Refill   acetaminophen (TYLENOL) 500 MG tablet Take 500 mg by mouth every 6 (six) hours as needed for mild pain or headache. Reported on 11/02/2015     Ascorbic Acid (VITAMIN C GUMMIE PO) Take 1 each by mouth every morning.     aspirin EC 81 MG tablet Take 1 tablet (81 mg total) by mouth daily. 150 tablet 2   Cranberry 240 MG CAPS Take  1 capsule by mouth daily. This is in her probiotic     [employer] ACID CONTROLLER 10 MG tablet SMARTSIG:1 Tablet(s) By Mouth Every 12 Hours PRN     estradiol (ESTRACE) 0.1 MG/GM vaginal cream Place 1 Applicatorful vaginally daily.     glimepiride (AMARYL) 2 MG tablet TAKE 1 TABLET BY MOUTH DAILY BEFORE BREAKFAST. 90 tablet 0   loratadine (CLARITIN) 10 MG tablet Take 10 mg by mouth daily.     Multiple Vitamin (MULTIVITAMIN WITH MINERALS) TABS tablet Take 1 tablet by mouth every morning.     omeprazole (PRILOSEC) 20 MG capsule TAKE 1 CAPSULE BY  MOUTH EVERY DAY 90 capsule 0   ondansetron (ZOFRAN) 8 MG tablet TAKE 1 TABLET BY MOUTH BEFORE CHEMO 30 tablet 1   phenazopyridine (PYRIDIUM) 200 MG tablet Take 1 tablet (200 mg total) by mouth every 8 (eight) hours as needed. 10 tablet 0   prochlorperazine (COMPAZINE) 10 MG tablet Take 1 tablet (10 mg total) by mouth every 6 (six) hours as needed for nausea or vomiting. 60 tablet 0   rosuvastatin (CRESTOR) 10 MG tablet Take 1 tablet (10 mg total) by mouth daily. 30 tablet 12   senna-docusate (SENOKOT-S) 8.6-50 MG tablet Take 1 tablet by mouth daily. 30 tablet prn   trimethoprim (TRIMPEX) 100 MG tablet Take 100 mg by mouth daily.     Vitamin D, Ergocalciferol, (DRISDOL) 1.25 MG (50000 UNIT) CAPS capsule Take 50,000 Units by mouth once a week. Vit d 3     folic acid (FOLVITE) 1 MG tablet TAKE 1 TABLET BY MOUTH EVERY DAY 90 tablet 0   temazepam (RESTORIL) 15 MG capsule Take 1 capsule (15 mg total) by mouth at bedtime as needed for sleep. 30 capsule 0   No current facility-administered medications for this visit.    SURGICAL HISTORY:  Past Surgical History:  Procedure Laterality Date   ABDOMINAL HYSTERECTOMY     COLOSTOMY TAKEDOWN N/A 07/10/2014   Procedure: LAPAROSCOPIC LYSIS OF ADHESIONS (90 MIN) LAPAROSCOPIC ASSISTED COLOSTOMY CLOSURE, RIGID PROCTOSIGMOIDOSCOPY;  Surgeon: Avel Peace, MD;  Location: [facility];  Service: General;  Laterality: N/A;   LAPAROTOMY N/A 11/03/2013   Procedure: EXPLORATORY LAPAROTOMY, DRAINAGE OF INTRA  ABDOMINAL ABSCESSES, MOBILIZATION OF SPLENIC FLEXURE, SIGMOID COLECTOMY WITH COLOSTOMY;  Surgeon: Adolph Pollack, MD;  Location: [facility];  Service: General;  Laterality: N/A;   VIDEO BRONCHOSCOPY Bilateral 08/30/2013   Procedure: VIDEO BRONCHOSCOPY WITH FLUORO;  Surgeon: Nyoka Cowden, MD;  Location: [facility];  Service: Cardiopulmonary;  Laterality: Bilateral;    REVIEW OF SYSTEMS:   Constitutional: Negative for appetite change, chills, fatigue, fever and  unexpected weight change.  HENT: Negative for mouth sores, nosebleeds, sore throat and trouble swallowing.   Eyes: Negative for eye problems and icterus.  Respiratory: Negative for cough, hemoptysis, shortness of breath and wheezing.   Cardiovascular: Positive for rib soreness after fall last week.  Negative for chest pain and leg swelling.  Gastrointestinal: Negative for abdominal pain, constipation, diarrhea, nausea and vomiting.  Genitourinary: Negative for bladder incontinence, difficulty urinating, dysuria (resolved at this time), frequency and hematuria.   Musculoskeletal: Negative for back pain, gait problem, neck pain and neck stiffness.  Skin: Negative for itching and rash.  Neurological: Negative for dizziness, extremity weakness, gait problem, headaches, light-headedness and seizures.  Hematological: Negative for adenopathy. Does not bruise/bleed easily.  Psychiatric/Behavioral: Negative for confusion, depression and sleep disturbance. The patient is not nervous/anxious   PHYSICAL EXAMINATION:  Blood pressure 123/70, pulse 78, temperature (!)  97.5 F (36.4 C), temperature source Oral, resp. rate 16, weight 165 lb 8 oz (75.1 kg), SpO2 100 %.  ECOG PERFORMANCE STATUS: 1  Physical Exam  Constitutional: Oriented to person, place, and time and well-developed, well-nourished, and in no distress.  HENT:  Head: Normocephalic and atraumatic.  Mouth/Throat: Oropharynx is clear and moist. No oropharyngeal exudate.  Eyes: Conjunctivae are normal. Right eye exhibits no discharge. Left eye exhibits no discharge. No scleral icterus.  Neck: Normal range of motion. Neck supple.  Cardiovascular: Normal rate, regular rhythm, normal heart sounds and intact distal pulses.   Pulmonary/Chest: Effort normal and breath sounds normal. No respiratory distress. No wheezes. No rales.  Abdominal: Soft. Bowel sounds are normal. Exhibits no distension and no mass. There is no tenderness.  Musculoskeletal:  Normal range of motion. Exhibits no edema.  Lymphadenopathy:    No cervical adenopathy.  Neurological: Alert and oriented to person, place, and time. Exhibits normal muscle tone. Gait normal. Coordination normal.  Skin: Skin is warm and dry. No rash noted. Not diaphoretic. No erythema. No pallor.  Psychiatric: Mood, memory and judgment normal.  Vitals reviewed.  LABORATORY DATA: Lab Results  Component Value Date   WBC 5.7 02/13/2023   HGB 11.4 (L) 02/13/2023   HCT 36.3 02/13/2023   MCV 97.8 02/13/2023   PLT 296 02/13/2023      Chemistry      Component Value Date/Time   NA 138 02/13/2023 0740   NA 140 11/03/2022 0844   NA 139 08/14/2017 0837   K 4.4 02/13/2023 0740   K 4.4 08/14/2017 0837   CL 105 02/13/2023 0740   CO2 23 02/13/2023 0740   CO2 24 08/14/2017 0837   BUN 20 02/13/2023 0740   BUN 33 (H) 11/03/2022 0844   BUN 13.3 08/14/2017 0837   CREATININE 0.90 02/13/2023 0740   CREATININE 0.8 08/14/2017 0837      Component Value Date/Time   CALCIUM 9.8 02/13/2023 0740   CALCIUM 9.3 08/14/2017 0837   ALKPHOS 104 02/13/2023 0740   ALKPHOS 107 08/14/2017 0837   AST 12 (L) 02/13/2023 0740   AST 12 08/14/2017 0837   ALT 9 02/13/2023 0740   ALT 12 08/14/2017 0837   BILITOT 0.5 02/13/2023 0740   BILITOT 0.37 08/14/2017 0837       RADIOGRAPHIC STUDIES:  CT Chest W Contrast  Result Date: 02/10/2023 CLINICAL DATA:  Non-small cell lung cancer, staging. * Tracking Code: BO * EXAM: CT CHEST, ABDOMEN, AND PELVIS WITH CONTRAST TECHNIQUE: Multidetector CT imaging of the chest, abdomen and pelvis was performed following the standard protocol during bolus administration of intravenous contrast. RADIATION DOSE REDUCTION: This exam was performed according to the departmental dose-optimization program which includes automated exposure control, adjustment of the mA and/or kV according to patient size and/or use of iterative reconstruction technique. CONTRAST:  OMNIPAQUE IOHEXOL  300 MG/ML  SOLN COMPARISON:  Prior CTs 11/15/2022 and 08/24/2022. FINDINGS: CT CHEST FINDINGS Cardiovascular: No acute vascular findings are demonstrated. There is atherosclerosis of the aorta, great vessels and coronary arteries. The heart size is normal. There is no pericardial effusion. Mediastinum/Nodes: There are no enlarged mediastinal, hilar or axillary lymph nodes. The thyroid gland, trachea and esophagus demonstrate no significant findings. Lungs/Pleura: No pleural effusion or pneumothorax. Moderate centrilobular and paraseptal emphysema with stable radiation changes medially at the right lung apex. Stable left apical scarring and stable 6 mm ground-glass opacity in the right lower lobe on image 92/4. No new, enlarging or suspicious nodules.  Musculoskeletal/Chest wall: No chest wall mass or suspicious osseous findings. CT ABDOMEN AND PELVIS FINDINGS Hepatobiliary: The liver is normal in density without suspicious focal abnormality. No evidence of gallstones, gallbladder wall thickening or biliary dilatation. Pancreas: Unremarkable. No pancreatic ductal dilatation or surrounding inflammatory changes. Spleen: Normal in size without focal abnormality. Adrenals/Urinary Tract: Both adrenal glands appear normal. No evidence of urinary tract calculus, suspicious renal lesion or hydronephrosis. The bladder appears normal for its degree of distention. Stomach/Bowel: Enteric contrast has passed into the proximal colon. The stomach appears unremarkable for its degree of distension. No evidence of bowel wall thickening, distention or surrounding inflammatory change. The appendix appears normal. Patent rectosigmoid anastomosis. Vascular/Lymphatic: There are no enlarged abdominal or pelvic lymph nodes. Aortic and branch vessel atherosclerosis without evidence of aneurysm or large vessel occlusion. Reproductive: Status post hysterectomy.  No adnexal mass. Other: Postsurgical changes in the anterior abdominal wall with a  small periumbilical hernia containing only fat. Inferior to the umbilicus, there is mild chronic diastasis of the rectus abdominus muscles. No ascites, peritoneal nodularity or pneumoperitoneum. Musculoskeletal: No acute or significant osseous findings. Stable mild spondylosis. IMPRESSION: 1. Stable CTS of the chest, abdomen and pelvis. 2. No evidence of local recurrence or metastatic disease. 3. Stable radiation changes medially at the right lung apex. 4. Stable 6 mm ground-glass opacity in the right lower lobe. 5. Stable postsurgical changes in the anterior abdominal wall with a small periumbilical hernia containing only fat. 6. Aortic Atherosclerosis (ICD10-I70.0) and Emphysema (ICD10-J43.9). Electronically Signed   By: Carey Bullocks M.D.   On: 02/10/2023 16:41   CT Abdomen Pelvis W Contrast  Result Date: 02/10/2023 CLINICAL DATA:  Non-small cell lung cancer, staging. * Tracking Code: BO * EXAM: CT CHEST, ABDOMEN, AND PELVIS WITH CONTRAST TECHNIQUE: Multidetector CT imaging of the chest, abdomen and pelvis was performed following the standard protocol during bolus administration of intravenous contrast. RADIATION DOSE REDUCTION: This exam was performed according to the departmental dose-optimization program which includes automated exposure control, adjustment of the mA and/or kV according to patient size and/or use of iterative reconstruction technique. CONTRAST:  OMNIPAQUE IOHEXOL 300 MG/ML  SOLN COMPARISON:  Prior CTs 11/15/2022 and 08/24/2022. FINDINGS: CT CHEST FINDINGS Cardiovascular: No acute vascular findings are demonstrated. There is atherosclerosis of the aorta, great vessels and coronary arteries. The heart size is normal. There is no pericardial effusion. Mediastinum/Nodes: There are no enlarged mediastinal, hilar or axillary lymph nodes. The thyroid gland, trachea and esophagus demonstrate no significant findings. Lungs/Pleura: No pleural effusion or pneumothorax. Moderate centrilobular  and paraseptal emphysema with stable radiation changes medially at the right lung apex. Stable left apical scarring and stable 6 mm ground-glass opacity in the right lower lobe on image 92/4. No new, enlarging or suspicious nodules. Musculoskeletal/Chest wall: No chest wall mass or suspicious osseous findings. CT ABDOMEN AND PELVIS FINDINGS Hepatobiliary: The liver is normal in density without suspicious focal abnormality. No evidence of gallstones, gallbladder wall thickening or biliary dilatation. Pancreas: Unremarkable. No pancreatic ductal dilatation or surrounding inflammatory changes. Spleen: Normal in size without focal abnormality. Adrenals/Urinary Tract: Both adrenal glands appear normal. No evidence of urinary tract calculus, suspicious renal lesion or hydronephrosis. The bladder appears normal for its degree of distention. Stomach/Bowel: Enteric contrast has passed into the proximal colon. The stomach appears unremarkable for its degree of distension. No evidence of bowel wall thickening, distention or surrounding inflammatory change. The appendix appears normal. Patent rectosigmoid anastomosis. Vascular/Lymphatic: There are no enlarged abdominal or pelvic lymph  nodes. Aortic and branch vessel atherosclerosis without evidence of aneurysm or large vessel occlusion. Reproductive: Status post hysterectomy.  No adnexal mass. Other: Postsurgical changes in the anterior abdominal wall with a small periumbilical hernia containing only fat. Inferior to the umbilicus, there is mild chronic diastasis of the rectus abdominus muscles. No ascites, peritoneal nodularity or pneumoperitoneum. Musculoskeletal: No acute or significant osseous findings. Stable mild spondylosis. IMPRESSION: 1. Stable CTS of the chest, abdomen and pelvis. 2. No evidence of local recurrence or metastatic disease. 3. Stable radiation changes medially at the right lung apex. 4. Stable 6 mm ground-glass opacity in the right lower lobe. 5. Stable  postsurgical changes in the anterior abdominal wall with a small periumbilical hernia containing only fat. 6. Aortic Atherosclerosis (ICD10-I70.0) and Emphysema (ICD10-J43.9). Electronically Signed   By: Carey Bullocks M.D.   On: 02/10/2023 16:41     ASSESSMENT/PLAN:  This is a very pleasant 72 year old Caucasian female with stage IV non-small cell lung cancer, adenocarcinoma who presented with a right upper lobe lung mass in addition to a solitary brain metastatsis.  She was diagnosed in January 2015.    The patient had completed induction systemic chemotherapy with carboplatin and Alimta with a partial response.   The patient is currently being treated with single  agent maintenance Alimta.  She is status post 149 cycles.   The patient recently had a restaging CT scan. The patient was seen with Dr. Arbutus Ped. Dr. Arbutus Ped personally and independently reviewed the scan and discussed the results with the patient. The scan showed no evidence of disease progression.  Also discussed with the patient there is no evidence of any fractures.. Dr. Arbutus Ped recommended he continue on the same treatment at the same dose.  Labs were reviewed. Recommend that she  proceed with #150 today as scheduled.    We will see her back for follow-up visit in 3 weeks for evaluation repeat blood work before undergoing cycle #151  We discussed the importance of having a primary care provider.  We will send her prescription for Restoril to help with her insomnia until she get established with a new PCP.   Urged her to take her steroid no later than 4 PM which can contribute to insomnia and flushing/sweating.  She recently saw Dr. Mitzi Hansen to see if they would recommend SBRT to a small 6 mm nodule. Dr. Mitzi Hansen recommended close monitoring and would reconsider if lesion became more progressive/solid. Recommend monitoring for now. This was stable on her CT scan from a few days ago.  The patient was advised to call immediately if she  has any concerning symptoms in the interval. The patient voices understanding of current disease status and treatment options and is in agreement with the current care plan. All questions were answered. The patient knows to call the clinic with any problems, questions or concerns. We can certainly see the patient much sooner if necessary  No orders of the defined types were placed in this encounter.     Addalyn Speedy L Sekou Zuckerman, PA-C 02/13/23  ADDENDUM: Hematology/Oncology Attending: I had a face-to-face encounter with the patient today.  I reviewed her records, lab, scan and recommended her care plan.  This is a very pleasant 72 years old white female with stage IV non-small cell lung cancer diagnosed in 2015 status post SBRT to solitary brain metastasis followed by palliative radiotherapy to the right lung tumor.  The patient then started systemic chemotherapy initially with carboplatin and Alimta for 6 cycles.  The patient  has been on maintenance treatment with single agent Alimta status post 149 cycles and she has been doing fine and tolerating her treatment fairly well. She had repeat CT scan of the chest, abdomen and pelvis performed recently.  I personally and independently reviewed the scan and discussed the result with the patient and her husband. Her scan showed no concerning findings for disease progression. I recommended for her to continue her current treatment with maintenance Alimta and she will proceed with cycle 150 today. The patient will come back for follow-up visit in 3 weeks for evaluation before the next cycle of her treatment. For the insomnia, I will give her prescription for Restoril 15 mg p.o. nightly. The patient was advised to call immediately if she has any other concerning symptoms in the interval. The total time spent in the appointment was 30 minutes. Disclaimer: This note was dictated with voice recognition software. Similar sounding words can inadvertently be  transcribed and may be missed upon review. Lajuana Matte, MD

## 2023-02-09 ENCOUNTER — Ambulatory Visit (HOSPITAL_COMMUNITY)
Admission: RE | Admit: 2023-02-09 | Discharge: 2023-02-09 | Disposition: A | Discharge status: Home / Self Care | Payer: Medicare Other | Source: Ambulatory Visit | Attending: Internal Medicine | Admitting: Internal Medicine

## 2023-02-09 DIAGNOSIS — C3490 Malignant neoplasm of unspecified part of unspecified bronchus or lung: Secondary | ICD-10-CM | POA: Diagnosis not present

## 2023-02-09 MED ORDER — IOHEXOL 9 MG/ML PO SOLN
1000.0000 mL | ORAL | Status: AC
  Administered 2023-02-09: 1000 mL via ORAL

## 2023-02-09 MED ORDER — IOHEXOL 300 MG/ML  SOLN
100.0000 mL | Freq: Once | INTRAMUSCULAR | Status: AC | PRN
  Administered 2023-02-09: 100 mL via INTRAVENOUS

## 2023-02-09 MED ORDER — IOHEXOL 9 MG/ML PO SOLN
ORAL | Status: AC
  Filled 2023-02-09: qty 1000

## 2023-02-10 ENCOUNTER — Ambulatory Visit: Payer: Medicare Other | Admitting: Nurse Practitioner

## 2023-02-10 MED FILL — Dexamethasone Sodium Phosphate Inj 100 MG/10ML: INTRAMUSCULAR | Qty: 1 | Status: AC

## 2023-02-13 ENCOUNTER — Inpatient Hospital Stay: Payer: Medicare Other | Attending: Internal Medicine

## 2023-02-13 ENCOUNTER — Inpatient Hospital Stay (HOSPITAL_BASED_OUTPATIENT_CLINIC_OR_DEPARTMENT_OTHER): Payer: Medicare Other | Admitting: Physician Assistant

## 2023-02-13 ENCOUNTER — Other Ambulatory Visit: Payer: Self-pay | Admitting: Physician Assistant

## 2023-02-13 ENCOUNTER — Inpatient Hospital Stay: Payer: Medicare Other

## 2023-02-13 ENCOUNTER — Other Ambulatory Visit: Payer: Self-pay

## 2023-02-13 VITALS — BP 123/70 | HR 78 | Temp 97.5°F | Resp 16 | Wt 165.5 lb

## 2023-02-13 DIAGNOSIS — Z5111 Encounter for antineoplastic chemotherapy: Secondary | ICD-10-CM

## 2023-02-13 DIAGNOSIS — C7931 Secondary malignant neoplasm of brain: Secondary | ICD-10-CM | POA: Diagnosis not present

## 2023-02-13 DIAGNOSIS — C3411 Malignant neoplasm of upper lobe, right bronchus or lung: Secondary | ICD-10-CM

## 2023-02-13 DIAGNOSIS — C3490 Malignant neoplasm of unspecified part of unspecified bronchus or lung: Secondary | ICD-10-CM

## 2023-02-13 LAB — CBC WITH DIFFERENTIAL (CANCER CENTER ONLY)
Abs Immature Granulocytes: 0.03 10*3/uL (ref 0.00–0.07)
Basophils Absolute: 0 10*3/uL (ref 0.0–0.1)
Basophils Relative: 0 %
Eosinophils Absolute: 0 10*3/uL (ref 0.0–0.5)
Eosinophils Relative: 0 %
HCT: 36.3 % (ref 36.0–46.0)
Hemoglobin: 11.4 g/dL — ABNORMAL LOW (ref 12.0–15.0)
Immature Granulocytes: 1 %
Lymphocytes Relative: 7 %
Lymphs Abs: 0.4 10*3/uL — ABNORMAL LOW (ref 0.7–4.0)
MCH: 30.7 pg (ref 26.0–34.0)
MCHC: 31.4 g/dL (ref 30.0–36.0)
MCV: 97.8 fL (ref 80.0–100.0)
Monocytes Absolute: 0.3 10*3/uL (ref 0.1–1.0)
Monocytes Relative: 5 %
Neutro Abs: 5 10*3/uL (ref 1.7–7.7)
Neutrophils Relative %: 87 %
Platelet Count: 296 10*3/uL (ref 150–400)
RBC: 3.71 MIL/uL — ABNORMAL LOW (ref 3.87–5.11)
RDW: 15.9 % — ABNORMAL HIGH (ref 11.5–15.5)
WBC Count: 5.7 10*3/uL (ref 4.0–10.5)
nRBC: 0 % (ref 0.0–0.2)

## 2023-02-13 LAB — CMP (CANCER CENTER ONLY)
ALT: 9 U/L (ref 0–44)
AST: 12 U/L — ABNORMAL LOW (ref 15–41)
Albumin: 3.9 g/dL (ref 3.5–5.0)
Alkaline Phosphatase: 104 U/L (ref 38–126)
Anion gap: 10 (ref 5–15)
BUN: 20 mg/dL (ref 8–23)
CO2: 23 mmol/L (ref 22–32)
Calcium: 9.8 mg/dL (ref 8.9–10.3)
Chloride: 105 mmol/L (ref 98–111)
Creatinine: 0.9 mg/dL (ref 0.44–1.00)
GFR, Estimated: >60 mL/min (ref >60)
Glucose, Bld: 171 mg/dL — ABNORMAL HIGH (ref 70–99)
Potassium: 4.4 mmol/L (ref 3.5–5.1)
Sodium: 138 mmol/L (ref 135–145)
Total Bilirubin: 0.5 mg/dL (ref 0.3–1.2)
Total Protein: 6.5 g/dL (ref 6.5–8.1)

## 2023-02-13 MED ORDER — SODIUM CHLORIDE 0.9 % IV SOLN: Freq: Once | INTRAVENOUS | Status: AC

## 2023-02-13 MED ORDER — SODIUM CHLORIDE 0.9 % IV SOLN
10.0000 mg | Freq: Once | INTRAVENOUS | Status: AC
  Administered 2023-02-13: 10 mg via INTRAVENOUS
  Filled 2023-02-13: qty 10

## 2023-02-13 MED ORDER — SODIUM CHLORIDE 0.9 % IV SOLN
500.0000 mg/m2 | Freq: Once | INTRAVENOUS | Status: AC
  Administered 2023-02-13: 900 mg via INTRAVENOUS
  Filled 2023-02-13: qty 20

## 2023-02-13 MED ORDER — TEMAZEPAM 15 MG PO CAPS
15.0000 mg | ORAL_CAPSULE | Freq: Every evening | ORAL | 0 refills | Status: DC | PRN
Start: 2023-02-13 — End: 2023-09-11

## 2023-02-21 ENCOUNTER — Telehealth: Payer: Self-pay | Admitting: Internal Medicine

## 2023-02-21 NOTE — Telephone Encounter (Signed)
Called patient regarding July and August, left a voicemail.

## 2023-03-03 MED FILL — Dexamethasone Sodium Phosphate Inj 100 MG/10ML: INTRAMUSCULAR | Qty: 1 | Status: AC

## 2023-03-06 ENCOUNTER — Inpatient Hospital Stay (HOSPITAL_BASED_OUTPATIENT_CLINIC_OR_DEPARTMENT_OTHER): Payer: Medicare Other | Admitting: Internal Medicine

## 2023-03-06 ENCOUNTER — Inpatient Hospital Stay: Payer: Medicare Other | Attending: Internal Medicine

## 2023-03-06 ENCOUNTER — Inpatient Hospital Stay: Payer: Medicare Other

## 2023-03-06 DIAGNOSIS — C3411 Malignant neoplasm of upper lobe, right bronchus or lung: Secondary | ICD-10-CM | POA: Diagnosis not present

## 2023-03-06 DIAGNOSIS — C7931 Secondary malignant neoplasm of brain: Secondary | ICD-10-CM | POA: Insufficient documentation

## 2023-03-06 DIAGNOSIS — Z5111 Encounter for antineoplastic chemotherapy: Secondary | ICD-10-CM | POA: Insufficient documentation

## 2023-03-06 DIAGNOSIS — Z79899 Other long term (current) drug therapy: Secondary | ICD-10-CM | POA: Insufficient documentation

## 2023-03-06 DIAGNOSIS — Z923 Personal history of irradiation: Secondary | ICD-10-CM | POA: Insufficient documentation

## 2023-03-06 LAB — CMP (CANCER CENTER ONLY)
ALT: 9 U/L (ref 0–44)
AST: 9 U/L — ABNORMAL LOW (ref 15–41)
Albumin: 3.9 g/dL (ref 3.5–5.0)
Alkaline Phosphatase: 118 U/L (ref 38–126)
Anion gap: 9 (ref 5–15)
BUN: 33 mg/dL — ABNORMAL HIGH (ref 8–23)
CO2: 23 mmol/L (ref 22–32)
Calcium: 10.1 mg/dL (ref 8.9–10.3)
Chloride: 105 mmol/L (ref 98–111)
Creatinine: 1.1 mg/dL — ABNORMAL HIGH (ref 0.44–1.00)
GFR, Estimated: 54 mL/min — ABNORMAL LOW (ref >60)
Glucose, Bld: 168 mg/dL — ABNORMAL HIGH (ref 70–99)
Potassium: 4.6 mmol/L (ref 3.5–5.1)
Sodium: 137 mmol/L (ref 135–145)
Total Bilirubin: 0.4 mg/dL (ref 0.3–1.2)
Total Protein: 7.2 g/dL (ref 6.5–8.1)

## 2023-03-06 LAB — CBC WITH DIFFERENTIAL (CANCER CENTER ONLY)
Abs Immature Granulocytes: 0.11 10*3/uL — ABNORMAL HIGH (ref 0.00–0.07)
Basophils Absolute: 0 10*3/uL (ref 0.0–0.1)
Basophils Relative: 0 %
Eosinophils Absolute: 0 10*3/uL (ref 0.0–0.5)
Eosinophils Relative: 0 %
HCT: 37.8 % (ref 36.0–46.0)
Hemoglobin: 12.1 g/dL (ref 12.0–15.0)
Immature Granulocytes: 1 %
Lymphocytes Relative: 4 %
Lymphs Abs: 0.5 10*3/uL — ABNORMAL LOW (ref 0.7–4.0)
MCH: 31.5 pg (ref 26.0–34.0)
MCHC: 32 g/dL (ref 30.0–36.0)
MCV: 98.4 fL (ref 80.0–100.0)
Monocytes Absolute: 0.4 10*3/uL (ref 0.1–1.0)
Monocytes Relative: 3 %
Neutro Abs: 11.3 10*3/uL — ABNORMAL HIGH (ref 1.7–7.7)
Neutrophils Relative %: 92 %
Platelet Count: 327 10*3/uL (ref 150–400)
RBC: 3.84 MIL/uL — ABNORMAL LOW (ref 3.87–5.11)
RDW: 16 % — ABNORMAL HIGH (ref 11.5–15.5)
WBC Count: 12.4 10*3/uL — ABNORMAL HIGH (ref 4.0–10.5)
nRBC: 0 % (ref 0.0–0.2)

## 2023-03-06 MED ORDER — CYANOCOBALAMIN 1000 MCG/ML IJ SOLN
1000.0000 ug | Freq: Once | INTRAMUSCULAR | Status: AC
  Administered 2023-03-06: 1000 ug via INTRAMUSCULAR
  Filled 2023-03-06: qty 1

## 2023-03-06 MED ORDER — SODIUM CHLORIDE 0.9 % IV SOLN: Freq: Once | INTRAVENOUS | Status: AC

## 2023-03-06 MED ORDER — SODIUM CHLORIDE 0.9 % IV SOLN
10.0000 mg | Freq: Once | INTRAVENOUS | Status: AC
  Administered 2023-03-06: 10 mg via INTRAVENOUS
  Filled 2023-03-06: qty 10

## 2023-03-06 MED ORDER — SODIUM CHLORIDE 0.9 % IV SOLN
500.0000 mg/m2 | Freq: Once | INTRAVENOUS | Status: AC
  Administered 2023-03-06: 900 mg via INTRAVENOUS
  Filled 2023-03-06: qty 20

## 2023-03-06 NOTE — Progress Notes (Signed)
[facility] Telephone:([phone]) [phone]   [phone]  OFFICE PROGRESS NOTE  [name], MD [address] [place] [address]  DIAGNOSIS: Stage IV (T2a, N0, M1b) non-small cell lung cancer, adenocarcinoma with negative EGFR and ALK mutations diagnosed in January 2015 and presented with right upper lobe lung mass in addition to a solitary brain metastasis.  PRIOR THERAPY: 1) Status post stereotactic radiotherapy to a solitary right parietal brain lesion under the care of Dr. Mitzi Hansen on 10/16/2013. 2) Status post palliative radiotherapy to the right lung tumor under the care of Dr. Mitzi Hansen completed on 12/05/2013. 3) Systemic chemotherapy with carboplatin for AUC of 5 and Alimta 500 mg/M2 every 3 weeks. First dose Jan 06 2014. Status post 6 cycles.  CURRENT THERAPY: Systemic chemotherapy with maintenance Alimta 500 MG/M2 every 3 weeks, status post 150 cycles.  INTERVAL HISTORY: Alexis [name] 72 y.o. female returns to the clinic today for follow-up visit accompanied by her husband.  The patient is feeling fine today with no concerning complaints except for occasional constipation.  She denied having any current chest pain, shortness of breath, cough or hemoptysis.  She has no nausea, vomiting, diarrhea or abdominal pain.  She has no recent weight loss or night sweats.  She has no headache or visual changes.  She continues to tolerate her maintenance treatment with Alimta fairly well.  She is here for evaluation before starting cycle #151.  MEDICAL HISTORY: Past Medical History:  Diagnosis Date   Anxiety    Anxiety 06/20/2016   Cervical cancer (HCC)    Diabetes mellitus without complication (HCC)    patient states she has type 2   Encounter for antineoplastic chemotherapy 07/20/2015   Malignant neoplasm of right upper lobe of lung (HCC)     non small cell lung cancer adenocarcioma with brain meta    ALLERGIES:  is allergic to glimepiride and  codeine.  MEDICATIONS:  Current Outpatient Medications  Medication Sig Dispense Refill   acetaminophen (TYLENOL) 500 MG tablet Take 500 mg by mouth every 6 (six) hours as needed for mild pain or headache. Reported on 11/02/2015     Ascorbic Acid (VITAMIN C GUMMIE PO) Take 1 each by mouth every morning.     aspirin EC 81 MG tablet Take 1 tablet (81 mg total) by mouth daily. 150 tablet 2   Cranberry 240 MG CAPS Take 1 capsule by mouth daily. This is in her probiotic     [employer] ACID CONTROLLER 10 MG tablet SMARTSIG:1 Tablet(s) By Mouth Every 12 Hours PRN     estradiol (ESTRACE) 0.1 MG/GM vaginal cream Place 1 Applicatorful vaginally daily.     folic acid (FOLVITE) 1 MG tablet TAKE 1 TABLET BY MOUTH EVERY DAY 90 tablet 0   glimepiride (AMARYL) 2 MG tablet TAKE 1 TABLET BY MOUTH DAILY BEFORE BREAKFAST. 90 tablet 0   loratadine (CLARITIN) 10 MG tablet Take 10 mg by mouth daily.     Multiple Vitamin (MULTIVITAMIN WITH MINERALS) TABS tablet Take 1 tablet by mouth every morning.     omeprazole (PRILOSEC) 20 MG capsule TAKE 1 CAPSULE BY MOUTH EVERY DAY 90 capsule 0   ondansetron (ZOFRAN) 8 MG tablet TAKE 1 TABLET BY MOUTH BEFORE CHEMO 30 tablet 1   phenazopyridine (PYRIDIUM) 200 MG tablet Take 1 tablet (200 mg total) by mouth every 8 (eight) hours as needed. 10 tablet 0   prochlorperazine (COMPAZINE) 10 MG tablet Take 1 tablet (10 mg total) by  mouth every 6 (six) hours as needed for nausea or vomiting. 60 tablet 0   rosuvastatin (CRESTOR) 10 MG tablet Take 1 tablet (10 mg total) by mouth daily. 30 tablet 12   senna-docusate (SENOKOT-S) 8.6-50 MG tablet Take 1 tablet by mouth daily. 30 tablet prn   temazepam (RESTORIL) 15 MG capsule Take 1 capsule (15 mg total) by mouth at bedtime as needed for sleep. 30 capsule 0   trimethoprim (TRIMPEX) 100 MG tablet Take 100 mg by mouth daily.     Vitamin D, Ergocalciferol, (DRISDOL) 1.25 MG (50000 UNIT) CAPS capsule Take 50,000 Units by mouth once a week. Vit d 3     No  current facility-administered medications for this visit.    SURGICAL HISTORY:  Past Surgical History:  Procedure Laterality Date   ABDOMINAL HYSTERECTOMY     COLOSTOMY TAKEDOWN N/A 07/10/2014   Procedure: LAPAROSCOPIC LYSIS OF ADHESIONS (90 MIN) LAPAROSCOPIC ASSISTED COLOSTOMY CLOSURE, RIGID PROCTOSIGMOIDOSCOPY;  Surgeon: Avel Peace, MD;  Location: [facility];  Service: General;  Laterality: N/A;   LAPAROTOMY N/A 11/03/2013   Procedure: EXPLORATORY LAPAROTOMY, DRAINAGE OF INTRA  ABDOMINAL ABSCESSES, MOBILIZATION OF SPLENIC FLEXURE, SIGMOID COLECTOMY WITH COLOSTOMY;  Surgeon: Adolph Pollack, MD;  Location: [facility];  Service: General;  Laterality: N/A;   VIDEO BRONCHOSCOPY Bilateral 08/30/2013   Procedure: VIDEO BRONCHOSCOPY WITH FLUORO;  Surgeon: Nyoka Cowden, MD;  Location: [facility];  Service: Cardiopulmonary;  Laterality: Bilateral;    REVIEW OF SYSTEMS:  A comprehensive review of systems was negative except for: Gastrointestinal: positive for constipation   PHYSICAL EXAMINATION: General appearance: alert, cooperative, and no distress Head: Normocephalic, without obvious abnormality, atraumatic Neck: no adenopathy, no JVD, supple, symmetrical, trachea midline, and thyroid not enlarged, symmetric, no tenderness/mass/nodules Lymph nodes: Cervical, supraclavicular, and axillary nodes normal. Resp: clear to auscultation bilaterally Back: symmetric, no curvature. ROM normal. No CVA tenderness. Cardio: regular rate and rhythm, S1, S2 normal, no murmur, click, rub or gallop GI: soft, non-tender; bowel sounds normal; no masses,  no organomegaly Extremities: extremities normal, atraumatic, no cyanosis or edema   ECOG PERFORMANCE STATUS: 1 - Symptomatic but completely ambulatory   Blood pressure 114/67, pulse 96, temperature 97.9 F (36.6 C), temperature source Oral, resp. rate 16, height 5\' 4"  (1.626 m), weight 167 lb 4.8 oz (75.9 kg), SpO2 99 %.  LABORATORY DATA: Lab Results   Component Value Date   WBC 5.7 02/13/2023   HGB 11.4 (L) 02/13/2023   HCT 36.3 02/13/2023   MCV 97.8 02/13/2023   PLT 296 02/13/2023      Chemistry      Component Value Date/Time   NA 138 02/13/2023 0740   NA 140 11/03/2022 0844   NA 139 08/14/2017 0837   K 4.4 02/13/2023 0740   K 4.4 08/14/2017 0837   CL 105 02/13/2023 0740   CO2 23 02/13/2023 0740   CO2 24 08/14/2017 0837   BUN 20 02/13/2023 0740   BUN 33 (H) 11/03/2022 0844   BUN 13.3 08/14/2017 0837   CREATININE 0.90 02/13/2023 0740   CREATININE 0.8 08/14/2017 0837      Component Value Date/Time   CALCIUM 9.8 02/13/2023 0740   CALCIUM 9.3 08/14/2017 0837   ALKPHOS 104 02/13/2023 0740   ALKPHOS 107 08/14/2017 0837   AST 12 (L) 02/13/2023 0740   AST 12 08/14/2017 0837   ALT 9 02/13/2023 0740   ALT 12 08/14/2017 0837   BILITOT 0.5 02/13/2023 0740   BILITOT 0.37 08/14/2017 1610  RADIOGRAPHIC STUDIES: CT Chest W Contrast  Result Date: 02/10/2023 CLINICAL DATA:  Non-small cell lung cancer, staging. * Tracking Code: BO * EXAM: CT CHEST, ABDOMEN, AND PELVIS WITH CONTRAST TECHNIQUE: Multidetector CT imaging of the chest, abdomen and pelvis was performed following the standard protocol during bolus administration of intravenous contrast. RADIATION DOSE REDUCTION: This exam was performed according to the departmental dose-optimization program which includes automated exposure control, adjustment of the mA and/or kV according to patient size and/or use of iterative reconstruction technique. CONTRAST:  OMNIPAQUE IOHEXOL 300 MG/ML  SOLN COMPARISON:  Prior CTs 11/15/2022 and 08/24/2022. FINDINGS: CT CHEST FINDINGS Cardiovascular: No acute vascular findings are demonstrated. There is atherosclerosis of the aorta, great vessels and coronary arteries. The heart size is normal. There is no pericardial effusion. Mediastinum/Nodes: There are no enlarged mediastinal, hilar or axillary lymph nodes. The thyroid gland, trachea and  esophagus demonstrate no significant findings. Lungs/Pleura: No pleural effusion or pneumothorax. Moderate centrilobular and paraseptal emphysema with stable radiation changes medially at the right lung apex. Stable left apical scarring and stable 6 mm ground-glass opacity in the right lower lobe on image 92/4. No new, enlarging or suspicious nodules. Musculoskeletal/Chest wall: No chest wall mass or suspicious osseous findings. CT ABDOMEN AND PELVIS FINDINGS Hepatobiliary: The liver is normal in density without suspicious focal abnormality. No evidence of gallstones, gallbladder wall thickening or biliary dilatation. Pancreas: Unremarkable. No pancreatic ductal dilatation or surrounding inflammatory changes. Spleen: Normal in size without focal abnormality. Adrenals/Urinary Tract: Both adrenal glands appear normal. No evidence of urinary tract calculus, suspicious renal lesion or hydronephrosis. The bladder appears normal for its degree of distention. Stomach/Bowel: Enteric contrast has passed into the proximal colon. The stomach appears unremarkable for its degree of distension. No evidence of bowel wall thickening, distention or surrounding inflammatory change. The appendix appears normal. Patent rectosigmoid anastomosis. Vascular/Lymphatic: There are no enlarged abdominal or pelvic lymph nodes. Aortic and branch vessel atherosclerosis without evidence of aneurysm or large vessel occlusion. Reproductive: Status post hysterectomy.  No adnexal mass. Other: Postsurgical changes in the anterior abdominal wall with a small periumbilical hernia containing only fat. Inferior to the umbilicus, there is mild chronic diastasis of the rectus abdominus muscles. No ascites, peritoneal nodularity or pneumoperitoneum. Musculoskeletal: No acute or significant osseous findings. Stable mild spondylosis. IMPRESSION: 1. Stable CTS of the chest, abdomen and pelvis. 2. No evidence of local recurrence or metastatic disease. 3. Stable  radiation changes medially at the right lung apex. 4. Stable 6 mm ground-glass opacity in the right lower lobe. 5. Stable postsurgical changes in the anterior abdominal wall with a small periumbilical hernia containing only fat. 6. Aortic Atherosclerosis (ICD10-I70.0) and Emphysema (ICD10-J43.9). Electronically Signed   By: Carey Bullocks M.D.   On: 02/10/2023 16:41   CT Abdomen Pelvis W Contrast  Result Date: 02/10/2023 CLINICAL DATA:  Non-small cell lung cancer, staging. * Tracking Code: BO * EXAM: CT CHEST, ABDOMEN, AND PELVIS WITH CONTRAST TECHNIQUE: Multidetector CT imaging of the chest, abdomen and pelvis was performed following the standard protocol during bolus administration of intravenous contrast. RADIATION DOSE REDUCTION: This exam was performed according to the departmental dose-optimization program which includes automated exposure control, adjustment of the mA and/or kV according to patient size and/or use of iterative reconstruction technique. CONTRAST:  OMNIPAQUE IOHEXOL 300 MG/ML  SOLN COMPARISON:  Prior CTs 11/15/2022 and 08/24/2022. FINDINGS: CT CHEST FINDINGS Cardiovascular: No acute vascular findings are demonstrated. There is atherosclerosis of the aorta, great vessels and coronary arteries.  The heart size is normal. There is no pericardial effusion. Mediastinum/Nodes: There are no enlarged mediastinal, hilar or axillary lymph nodes. The thyroid gland, trachea and esophagus demonstrate no significant findings. Lungs/Pleura: No pleural effusion or pneumothorax. Moderate centrilobular and paraseptal emphysema with stable radiation changes medially at the right lung apex. Stable left apical scarring and stable 6 mm ground-glass opacity in the right lower lobe on image 92/4. No new, enlarging or suspicious nodules. Musculoskeletal/Chest wall: No chest wall mass or suspicious osseous findings. CT ABDOMEN AND PELVIS FINDINGS Hepatobiliary: The liver is normal in density without suspicious  focal abnormality. No evidence of gallstones, gallbladder wall thickening or biliary dilatation. Pancreas: Unremarkable. No pancreatic ductal dilatation or surrounding inflammatory changes. Spleen: Normal in size without focal abnormality. Adrenals/Urinary Tract: Both adrenal glands appear normal. No evidence of urinary tract calculus, suspicious renal lesion or hydronephrosis. The bladder appears normal for its degree of distention. Stomach/Bowel: Enteric contrast has passed into the proximal colon. The stomach appears unremarkable for its degree of distension. No evidence of bowel wall thickening, distention or surrounding inflammatory change. The appendix appears normal. Patent rectosigmoid anastomosis. Vascular/Lymphatic: There are no enlarged abdominal or pelvic lymph nodes. Aortic and branch vessel atherosclerosis without evidence of aneurysm or large vessel occlusion. Reproductive: Status post hysterectomy.  No adnexal mass. Other: Postsurgical changes in the anterior abdominal wall with a small periumbilical hernia containing only fat. Inferior to the umbilicus, there is mild chronic diastasis of the rectus abdominus muscles. No ascites, peritoneal nodularity or pneumoperitoneum. Musculoskeletal: No acute or significant osseous findings. Stable mild spondylosis. IMPRESSION: 1. Stable CTS of the chest, abdomen and pelvis. 2. No evidence of local recurrence or metastatic disease. 3. Stable radiation changes medially at the right lung apex. 4. Stable 6 mm ground-glass opacity in the right lower lobe. 5. Stable postsurgical changes in the anterior abdominal wall with a small periumbilical hernia containing only fat. 6. Aortic Atherosclerosis (ICD10-I70.0) and Emphysema (ICD10-J43.9). Electronically Signed   By: Carey Bullocks M.D.   On: 02/10/2023 16:41    ASSESSMENT AND PLAN:  This is a very pleasant 72 years old white female with metastatic non-small cell lung cancer, adenocarcinoma status post induction  systemic chemotherapy with carboplatin and Alimta with partial response.  The patient has no actionable EGFR or ALK mutations at the time of her diagnosis. The patient is currently on maintenance treatment with single agent Alimta status post 150 cycles. She has been tolerating this treatment well with no concerning adverse effects. I recommended for the patient to proceed with cycle #151 today as planned. I will see her back for follow-up visit in 3 weeks for evaluation before the next cycle of her treatment. The patient was advised to call immediately if she has any other concerning symptoms in the interval. The patient voices understanding of current disease status and treatment options and is in agreement with the current care plan. All questions were answered. The patient knows to call the clinic with any problems, questions or concerns. We can certainly see the patient much sooner if necessary.  Disclaimer: This note was dictated with voice recognition software. Similar sounding words can inadvertently be transcribed and may not be corrected upon review.

## 2023-03-06 NOTE — Patient Instructions (Signed)
[facility] [facility]  Discharge Instructions: Thank you for choosing [facility] to provide your oncology and hematology care.   If you have a lab appointment with the Cancer Center, please go directly to the Cancer Center and check in at the registration area.   Wear comfortable clothing and clothing appropriate for easy access to any Portacath or PICC line.   We strive to give you quality time with your provider. You may need to reschedule your appointment if you arrive late (15 or more minutes).  Arriving late affects you and other patients whose appointments are after yours.  Also, if you miss three or more appointments without notifying the office, you may be dismissed from the clinic at the provider's discretion.      For prescription refill requests, have your pharmacy contact our office and allow 72 hours for refills to be completed.    Today you received the following chemotherapy and/or immunotherapy agents alimta      To help prevent nausea and vomiting after your treatment, we encourage you to take your nausea medication as directed.  BELOW ARE SYMPTOMS THAT SHOULD BE REPORTED IMMEDIATELY: *FEVER GREATER THAN 100.4 F (38 C) OR HIGHER *CHILLS OR SWEATING *NAUSEA AND VOMITING THAT IS NOT CONTROLLED WITH YOUR NAUSEA MEDICATION *UNUSUAL SHORTNESS OF BREATH *UNUSUAL BRUISING OR BLEEDING *URINARY PROBLEMS (pain or burning when urinating, or frequent urination) *BOWEL PROBLEMS (unusual diarrhea, constipation, pain near the anus) TENDERNESS IN MOUTH AND THROAT WITH OR WITHOUT PRESENCE OF ULCERS (sore throat, sores in mouth, or a toothache) UNUSUAL RASH, SWELLING OR PAIN  UNUSUAL VAGINAL DISCHARGE OR ITCHING   Items with * indicate a potential emergency and should be followed up as soon as possible or go to the Emergency Department if any problems should occur.  Please show the CHEMOTHERAPY ALERT CARD or IMMUNOTHERAPY ALERT CARD at check-in  to the Emergency Department and triage nurse.  Should you have questions after your visit or need to cancel or reschedule your appointment, please contact [facility]  Dept: [phone]  and follow the prompts.  Office hours are 8:00 a.m. to 4:30 p.m. Monday - Friday. Please note that voicemails left after 4:00 p.m. may not be returned until the following business day.  We are closed weekends and major holidays. You have access to a nurse at all times for urgent questions. Please call the main number to the clinic Dept: [phone] and follow the prompts.   For any non-urgent questions, you may also contact your provider using MyChart. We now offer e-Visits for anyone 48 and older to request care online for non-urgent symptoms. For details visit [email or web address].   Also download the MyChart app! Go to the app store, search "MyChart", open the app, select [facility] [place], and log in with your MyChart username and password.

## 2023-03-24 MED FILL — Dexamethasone Sodium Phosphate Inj 100 MG/10ML: INTRAMUSCULAR | Qty: 1 | Status: AC

## 2023-03-27 ENCOUNTER — Inpatient Hospital Stay: Payer: Medicare Other

## 2023-03-27 ENCOUNTER — Inpatient Hospital Stay (HOSPITAL_BASED_OUTPATIENT_CLINIC_OR_DEPARTMENT_OTHER): Payer: Medicare Other | Admitting: Internal Medicine

## 2023-03-27 ENCOUNTER — Other Ambulatory Visit: Payer: Self-pay

## 2023-03-27 VITALS — BP 130/64 | HR 66 | Temp 97.9°F | Resp 17 | Ht 64.0 in | Wt 166.8 lb

## 2023-03-27 VITALS — BP 131/71 | HR 69

## 2023-03-27 DIAGNOSIS — C7931 Secondary malignant neoplasm of brain: Secondary | ICD-10-CM | POA: Diagnosis not present

## 2023-03-27 DIAGNOSIS — C3411 Malignant neoplasm of upper lobe, right bronchus or lung: Secondary | ICD-10-CM | POA: Diagnosis not present

## 2023-03-27 DIAGNOSIS — Z79899 Other long term (current) drug therapy: Secondary | ICD-10-CM | POA: Diagnosis not present

## 2023-03-27 DIAGNOSIS — Z923 Personal history of irradiation: Secondary | ICD-10-CM | POA: Diagnosis not present

## 2023-03-27 DIAGNOSIS — Z5111 Encounter for antineoplastic chemotherapy: Secondary | ICD-10-CM | POA: Diagnosis not present

## 2023-03-27 LAB — CMP (CANCER CENTER ONLY)
ALT: 8 U/L (ref 0–44)
AST: 9 U/L — ABNORMAL LOW (ref 15–41)
Albumin: 3.9 g/dL (ref 3.5–5.0)
Alkaline Phosphatase: 104 U/L (ref 38–126)
Anion gap: 8 (ref 5–15)
BUN: 20 mg/dL (ref 8–23)
CO2: 23 mmol/L (ref 22–32)
Calcium: 9.6 mg/dL (ref 8.9–10.3)
Chloride: 108 mmol/L (ref 98–111)
Creatinine: 0.84 mg/dL (ref 0.44–1.00)
GFR, Estimated: >60 mL/min (ref >60)
Glucose, Bld: 146 mg/dL — ABNORMAL HIGH (ref 70–99)
Potassium: 4.4 mmol/L (ref 3.5–5.1)
Sodium: 139 mmol/L (ref 135–145)
Total Bilirubin: 0.3 mg/dL (ref 0.3–1.2)
Total Protein: 6.2 g/dL — ABNORMAL LOW (ref 6.5–8.1)

## 2023-03-27 LAB — CBC WITH DIFFERENTIAL (CANCER CENTER ONLY)
Abs Immature Granulocytes: 0.08 10*3/uL — ABNORMAL HIGH (ref 0.00–0.07)
Basophils Absolute: 0 10*3/uL (ref 0.0–0.1)
Basophils Relative: 0 %
Eosinophils Absolute: 0 10*3/uL (ref 0.0–0.5)
Eosinophils Relative: 0 %
HCT: 35.3 % — ABNORMAL LOW (ref 36.0–46.0)
Hemoglobin: 11.7 g/dL — ABNORMAL LOW (ref 12.0–15.0)
Immature Granulocytes: 1 %
Lymphocytes Relative: 5 %
Lymphs Abs: 0.5 10*3/uL — ABNORMAL LOW (ref 0.7–4.0)
MCH: 32.4 pg (ref 26.0–34.0)
MCHC: 33.1 g/dL (ref 30.0–36.0)
MCV: 97.8 fL (ref 80.0–100.0)
Monocytes Absolute: 0.5 10*3/uL (ref 0.1–1.0)
Monocytes Relative: 5 %
Neutro Abs: 10.7 10*3/uL — ABNORMAL HIGH (ref 1.7–7.7)
Neutrophils Relative %: 89 %
Platelet Count: 287 10*3/uL (ref 150–400)
RBC: 3.61 MIL/uL — ABNORMAL LOW (ref 3.87–5.11)
RDW: 16.3 % — ABNORMAL HIGH (ref 11.5–15.5)
WBC Count: 11.9 10*3/uL — ABNORMAL HIGH (ref 4.0–10.5)
nRBC: 0 % (ref 0.0–0.2)

## 2023-03-27 MED ORDER — SODIUM CHLORIDE 0.9 % IV SOLN: Freq: Once | INTRAVENOUS | Status: AC

## 2023-03-27 MED ORDER — SODIUM CHLORIDE 0.9 % IV SOLN
500.0000 mg/m2 | Freq: Once | INTRAVENOUS | Status: AC
  Administered 2023-03-27: 900 mg via INTRAVENOUS
  Filled 2023-03-27: qty 20

## 2023-03-27 MED ORDER — SODIUM CHLORIDE 0.9 % IV SOLN
10.0000 mg | Freq: Once | INTRAVENOUS | Status: AC
  Administered 2023-03-27: 10 mg via INTRAVENOUS
  Filled 2023-03-27: qty 10

## 2023-03-27 NOTE — Progress Notes (Signed)
[facility] Telephone:([phone]) [phone]   [phone]) [phone]  OFFICE PROGRESS NOTE  [name], MD [address] [place] [address]  DIAGNOSIS: Stage IV (T2a, N0, M1b) non-small cell lung cancer, adenocarcinoma with negative EGFR and ALK mutations diagnosed in January 2015 and presented with right upper lobe lung mass in addition to a solitary brain metastasis.  PRIOR THERAPY: 1) Status post stereotactic radiotherapy to a solitary right parietal brain lesion under the care of Dr. Mitzi Hansen on 10/16/2013. 2) Status post palliative radiotherapy to the right lung tumor under the care of Dr. Mitzi Hansen completed on 12/05/2013. 3) Systemic chemotherapy with carboplatin for AUC of 5 and Alimta 500 mg/M2 every 3 weeks. First dose Jan 06 2014. Status post 6 cycles.  CURRENT THERAPY: Systemic chemotherapy with maintenance Alimta 500 MG/M2 every 3 weeks, status post 151 cycles.  INTERVAL HISTORY: Alexis [name] 72 y.o. female returns to the clinic today for follow-up visit accompanied by her husband.  The patient mentions that she is not feeling well but no specific complaints except for constipation.  She denied having any chest pain, shortness of breath, cough or hemoptysis.  She has no nausea, vomiting, diarrhea or constipation.  She has no headache or visual changes.  She has no recent weight loss or night sweats.  She is here today for evaluation before starting cycle #152.  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  MEDICATIONS:  Current Outpatient Medications  Medication Sig Dispense  Refill   acetaminophen (TYLENOL) 500 MG tablet Take 500 mg by mouth every 6 (six) hours as needed for mild pain or headache. Reported on 11/02/2015     Ascorbic Acid (VITAMIN C GUMMIE PO) Take 1 each by mouth every morning.     aspirin EC 81 MG tablet Take 1 tablet (81 mg total) by mouth daily. 150 tablet 2   Cranberry 240 MG CAPS Take 1 capsule by mouth daily. This is in her probiotic     [employer] ACID CONTROLLER 10 MG tablet SMARTSIG:1 Tablet(s) By Mouth Every 12 Hours PRN     estradiol (ESTRACE) 0.1 MG/GM vaginal cream Place 1 Applicatorful vaginally daily.     folic acid (FOLVITE) 1 MG tablet TAKE 1 TABLET BY MOUTH EVERY DAY 90 tablet 0   glimepiride (AMARYL) 2 MG tablet TAKE 1 TABLET BY MOUTH DAILY BEFORE BREAKFAST. 90 tablet 0   loratadine (CLARITIN) 10 MG tablet Take 10 mg by mouth daily.     Multiple Vitamin (MULTIVITAMIN WITH MINERALS) TABS tablet Take 1 tablet by mouth every morning.     omeprazole (PRILOSEC) 20 MG capsule TAKE 1 CAPSULE BY MOUTH EVERY DAY 90 capsule 0   ondansetron (ZOFRAN) 8 MG tablet TAKE 1 TABLET BY MOUTH BEFORE CHEMO 30 tablet 1   phenazopyridine (PYRIDIUM) 200 MG tablet Take 1 tablet (200 mg total) by mouth every 8 (eight) hours as needed. 10 tablet 0   prochlorperazine (COMPAZINE) 10 MG tablet Take 1 tablet (10 mg total) by mouth every 6 (six) hours as needed for nausea or vomiting.  60 tablet 0   rosuvastatin (CRESTOR) 10 MG tablet Take 1 tablet (10 mg total) by mouth daily. 30 tablet 12   senna-docusate (SENOKOT-S) 8.6-50 MG tablet Take 1 tablet by mouth daily. 30 tablet prn   temazepam (RESTORIL) 15 MG capsule Take 1 capsule (15 mg total) by mouth at bedtime as needed for sleep. 30 capsule 0   trimethoprim (TRIMPEX) 100 MG tablet Take 100 mg by mouth daily.     Vitamin D, Ergocalciferol, (DRISDOL) 1.25 MG (50000 UNIT) CAPS capsule Take 50,000 Units by mouth once a week. Vit d 3     No current facility-administered medications for this visit.    SURGICAL HISTORY:   Past Surgical History:  Procedure Laterality Date   ABDOMINAL HYSTERECTOMY     COLOSTOMY TAKEDOWN N/A 07/10/2014   Procedure: LAPAROSCOPIC LYSIS OF ADHESIONS (90 MIN) LAPAROSCOPIC ASSISTED COLOSTOMY CLOSURE, RIGID PROCTOSIGMOIDOSCOPY;  Surgeon: Avel Peace, MD;  Location: [facility];  Service: General;  Laterality: N/A;   LAPAROTOMY N/A 11/03/2013   Procedure: EXPLORATORY LAPAROTOMY, DRAINAGE OF INTRA  ABDOMINAL ABSCESSES, MOBILIZATION OF SPLENIC FLEXURE, SIGMOID COLECTOMY WITH COLOSTOMY;  Surgeon: Adolph Pollack, MD;  Location: [facility];  Service: General;  Laterality: N/A;   VIDEO BRONCHOSCOPY Bilateral 08/30/2013   Procedure: VIDEO BRONCHOSCOPY WITH FLUORO;  Surgeon: Nyoka Cowden, MD;  Location: [facility];  Service: Cardiopulmonary;  Laterality: Bilateral;    REVIEW OF SYSTEMS:  A comprehensive review of systems was negative except for: Gastrointestinal: positive for constipation   PHYSICAL EXAMINATION: General appearance: alert, cooperative, and no distress Head: Normocephalic, without obvious abnormality, atraumatic Neck: no adenopathy, no JVD, supple, symmetrical, trachea midline, and thyroid not enlarged, symmetric, no tenderness/mass/nodules Lymph nodes: Cervical, supraclavicular, and axillary nodes normal. Resp: clear to auscultation bilaterally Back: symmetric, no curvature. ROM normal. No CVA tenderness. Cardio: regular rate and rhythm, S1, S2 normal, no murmur, click, rub or gallop GI: soft, non-tender; bowel sounds normal; no masses,  no organomegaly Extremities: extremities normal, atraumatic, no cyanosis or edema   ECOG PERFORMANCE STATUS: 1 - Symptomatic but completely ambulatory   Blood pressure 130/64, pulse 66, temperature 97.9 F (36.6 C), temperature source Oral, resp. rate 17, height 5\' 4"  (1.626 m), weight 166 lb 12.8 oz (75.7 kg), SpO2 100%.  LABORATORY DATA: Lab Results  Component Value Date   WBC 12.4 (H) 03/06/2023   HGB 12.1 03/06/2023   HCT 37.8  03/06/2023   MCV 98.4 03/06/2023   PLT 327 03/06/2023      Chemistry      Component Value Date/Time   NA 137 03/06/2023 1341   NA 140 11/03/2022 0844   NA 139 08/14/2017 0837   K 4.6 03/06/2023 1341   K 4.4 08/14/2017 0837   CL 105 03/06/2023 1341   CO2 23 03/06/2023 1341   CO2 24 08/14/2017 0837   BUN 33 (H) 03/06/2023 1341   BUN 33 (H) 11/03/2022 0844   BUN 13.3 08/14/2017 0837   CREATININE 1.10 (H) 03/06/2023 1341   CREATININE 0.8 08/14/2017 0837      Component Value Date/Time   CALCIUM 10.1 03/06/2023 1341   CALCIUM 9.3 08/14/2017 0837   ALKPHOS 118 03/06/2023 1341   ALKPHOS 107 08/14/2017 0837   AST 9 (L) 03/06/2023 1341   AST 12 08/14/2017 0837   ALT 9 03/06/2023 1341   ALT 12 08/14/2017 0837   BILITOT 0.4 03/06/2023 1341   BILITOT 0.37 08/14/2017 0837       RADIOGRAPHIC STUDIES: No results found.  ASSESSMENT  AND PLAN:  This is a very pleasant 72 years old white female with metastatic non-small cell lung cancer, adenocarcinoma status post induction systemic chemotherapy with carboplatin and Alimta with partial response.  The patient has no actionable EGFR or ALK mutations at the time of her diagnosis. The patient is currently on maintenance treatment with single agent Alimta status post 151 cycles. She has been tolerating the treatment well with no concerning adverse effects. I recommended for her to proceed with cycle #152 today as planned. For the constipation she was advised to use MiraLAX and if no bowel movement for several days to try milk of magnesia. She will come back for follow-up visit in 3 weeks for evaluation before the next cycle of her treatment. She was advised to call immediately if she has any other concerning symptoms in the interval. The patient voices understanding of current disease status and treatment options and is in agreement with the current care plan. All questions were answered. The patient knows to call the clinic with any  problems, questions or concerns. We can certainly see the patient much sooner if necessary.  Disclaimer: This note was dictated with voice recognition software. Similar sounding words can inadvertently be transcribed and may not be corrected upon review.

## 2023-03-27 NOTE — Patient Instructions (Signed)
[facility]  Discharge Instructions: Thank you for choosing [facility] to provide your oncology and hematology care.   If you have a lab appointment with the Cancer Center, please go directly to the Cancer Center and check in at the registration area.   Wear comfortable clothing and clothing appropriate for easy access to any Portacath or PICC line.   We strive to give you quality time with your provider. You may need to reschedule your appointment if you arrive late (15 or more minutes).  Arriving late affects you and other patients whose appointments are after yours.  Also, if you miss three or more appointments without notifying the office, you may be dismissed from the clinic at the provider's discretion.      For prescription refill requests, have your pharmacy contact our office and allow 72 hours for refills to be completed.    Today you received the following chemotherapy and/or immunotherapy agents: Alimta.       To help prevent nausea and vomiting after your treatment, we encourage you to take your nausea medication as directed.  BELOW ARE SYMPTOMS THAT SHOULD BE REPORTED IMMEDIATELY: *FEVER GREATER THAN 100.4 F (38 C) OR HIGHER *CHILLS OR SWEATING *NAUSEA AND VOMITING THAT IS NOT CONTROLLED WITH YOUR NAUSEA MEDICATION *UNUSUAL SHORTNESS OF BREATH *UNUSUAL BRUISING OR BLEEDING *URINARY PROBLEMS (pain or burning when urinating, or frequent urination) *BOWEL PROBLEMS (unusual diarrhea, constipation, pain near the anus) TENDERNESS IN MOUTH AND THROAT WITH OR WITHOUT PRESENCE OF ULCERS (sore throat, sores in mouth, or a toothache) UNUSUAL RASH, SWELLING OR PAIN  UNUSUAL VAGINAL DISCHARGE OR ITCHING   Items with * indicate a potential emergency and should be followed up as soon as possible or go to the Emergency Department if any problems should occur.  Please show the CHEMOTHERAPY ALERT CARD or IMMUNOTHERAPY ALERT CARD at  check-in to the Emergency Department and triage nurse.  Should you have questions after your visit or need to cancel or reschedule your appointment, please contact [facility]  Dept: [phone]  and follow the prompts.  Office hours are 8:00 a.m. to 4:30 p.m. Monday - Friday. Please note that voicemails left after 4:00 p.m. may not be returned until the following business day.  We are closed weekends and major holidays. You have access to a nurse at all times for urgent questions. Please call the main number to the clinic Dept: [phone] and follow the prompts.   For any non-urgent questions, you may also contact your provider using MyChart. We now offer e-Visits for anyone 18 and older to request care online for non-urgent symptoms. For details visit [email or web address].   Also download the MyChart app! Go to the app store, search "MyChart", open the app, select [facility], and log in with your MyChart username and password.   

## 2023-03-28 ENCOUNTER — Other Ambulatory Visit: Payer: Self-pay | Admitting: Physician Assistant

## 2023-03-28 DIAGNOSIS — C3411 Malignant neoplasm of upper lobe, right bronchus or lung: Secondary | ICD-10-CM

## 2023-03-28 DIAGNOSIS — C7931 Secondary malignant neoplasm of brain: Secondary | ICD-10-CM

## 2023-04-12 NOTE — Progress Notes (Signed)
[facility] OFFICE PROGRESS NOTE  [name], MD [address] [place] [address]  DIAGNOSIS: Stage IV (T2a, N0, M1b) non-small cell lung cancer, adenocarcinoma with negative EGFR and ALK mutations diagnosed in January 2015 and presented with right upper lobe lung mass in addition to a solitary brain metastasis   PRIOR THERAPY: 1) Status post stereotactic radiotherapy to a solitary right parietal brain lesion under the care of Dr. Mitzi Hansen on 10/16/2013. 2) Status post palliative radiotherapy to the right lung tumor under the care of Dr. Mitzi Hansen completed on 12/05/2013. 3) Systemic chemotherapy with carboplatin for AUC of 5 and Alimta 500 mg/M2 every 3 weeks. First dose Jan 06 2014. Status post 6 cycles  CURRENT THERAPY: Systemic chemotherapy with maintenance Alimta 500 MG/M2 every 3 weeks, status post 152 cycles.   INTERVAL HISTORY: Alexis [name] 72 y.o. female returns to the clinic today for a follow up visit accompanied by her husband. The patient is feeling fairly well today with out any concerning complaints except she tells me she is meeting with a lawyer soon to make a will and fill out advance directives. She would like to be DNR.   A few weeks ago, she saw radiation oncology recently to see if they would consider SBRT to a RLL lung lesion. They recommended monitoring for now and would revisit this if it became more solid or progressive. She is currently undergoing single agent chemotherapy with Alimta.  She tolerates this well.  Denies any fever, chills, or unexplained weight loss.  She sometimes gets night sweats, especially at night when she takes her steroid. Denies any chest pain, cough, or hemoptysis.  She denies any significant or worsening shortness of breath.  Denies any nausea or vomiting.  She sometimes has constipation. Denies any headache or visual changes. She sees urology for frequent UTIs. She denies UTI symptoms at this time.  She is working on  establishing with a PCP. She is here today for evaluation and to review her scan before starting cycle 153.     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  MEDICATIONS:  Current Outpatient Medications  Medication Sig Dispense Refill   acetaminophen (TYLENOL) 500 MG tablet Take 500 mg by mouth every 6 (six) hours as needed for mild pain or headache. Reported on 11/02/2015     Ascorbic Acid (VITAMIN C GUMMIE PO) Take 1 each by mouth every morning.     aspirin EC 81 MG tablet Take 1 tablet (81 mg total) by mouth daily. 150 tablet 2   Cranberry 240 MG CAPS Take 1 capsule by mouth daily. This is in her probiotic     [employer] ACID CONTROLLER 10 MG tablet SMARTSIG:1 Tablet(s) By Mouth Every 12 Hours PRN     dexamethasone (DECADRON) 4 MG tablet TAKE 1 TABLET TWICE A DAY THE DAY BEFORE, THE DAY OF, AND THE DAY AFTER CHEMOTHERAPY. 40 tablet 2   estradiol (ESTRACE) 0.1 MG/GM vaginal cream Place 1 Applicatorful vaginally daily.     folic acid (FOLVITE) 1 MG tablet TAKE 1 TABLET BY  MOUTH EVERY DAY 90 tablet 0   glimepiride (AMARYL) 2 MG tablet TAKE 1 TABLET BY MOUTH DAILY BEFORE BREAKFAST. 90 tablet 0   loratadine (CLARITIN) 10 MG tablet Take 10 mg by mouth daily.     Multiple Vitamin (MULTIVITAMIN WITH MINERALS) TABS tablet Take 1 tablet by mouth every morning.     omeprazole (PRILOSEC) 20 MG capsule TAKE 1 CAPSULE BY MOUTH EVERY DAY 90 capsule 0   ondansetron (ZOFRAN) 8 MG tablet TAKE 1 TABLET BY MOUTH BEFORE CHEMO 30 tablet 1   phenazopyridine (PYRIDIUM) 200 MG tablet Take 1 tablet (200 mg total) by mouth every 8 (eight) hours as  needed. 10 tablet 0   prochlorperazine (COMPAZINE) 10 MG tablet Take 1 tablet (10 mg total) by mouth every 6 (six) hours as needed for nausea or vomiting. 60 tablet 0   rosuvastatin (CRESTOR) 10 MG tablet Take 1 tablet (10 mg total) by mouth daily. 30 tablet 12   senna-docusate (SENOKOT-S) 8.6-50 MG tablet Take 1 tablet by mouth daily. 30 tablet prn   temazepam (RESTORIL) 15 MG capsule Take 1 capsule (15 mg total) by mouth at bedtime as needed for sleep. 30 capsule 0   trimethoprim (TRIMPEX) 100 MG tablet Take 100 mg by mouth daily.     Vitamin D, Ergocalciferol, (DRISDOL) 1.25 MG (50000 UNIT) CAPS capsule Take 50,000 Units by mouth once a week. Vit d 3     No current facility-administered medications for this visit.    SURGICAL HISTORY:  Past Surgical History:  Procedure Laterality Date   ABDOMINAL HYSTERECTOMY     COLOSTOMY TAKEDOWN N/A 07/10/2014   Procedure: LAPAROSCOPIC LYSIS OF ADHESIONS (90 MIN) LAPAROSCOPIC ASSISTED COLOSTOMY CLOSURE, RIGID PROCTOSIGMOIDOSCOPY;  Surgeon: Avel Peace, MD;  Location: [facility];  Service: General;  Laterality: N/A;   LAPAROTOMY N/A 11/03/2013   Procedure: EXPLORATORY LAPAROTOMY, DRAINAGE OF INTRA  ABDOMINAL ABSCESSES, MOBILIZATION OF SPLENIC FLEXURE, SIGMOID COLECTOMY WITH COLOSTOMY;  Surgeon: Adolph Pollack, MD;  Location: [facility];  Service: General;  Laterality: N/A;   VIDEO BRONCHOSCOPY Bilateral 08/30/2013   Procedure: VIDEO BRONCHOSCOPY WITH FLUORO;  Surgeon: Nyoka Cowden, MD;  Location: [facility];  Service: Cardiopulmonary;  Laterality: Bilateral;    REVIEW OF SYSTEMS:   Review of Systems  Constitutional: Negative for appetite change, chills, fatigue, fever and unexpected weight change.  HENT:   Negative for mouth sores, nosebleeds, sore throat and trouble swallowing.   Eyes: Negative for eye problems and icterus.  Respiratory: Negative for cough, hemoptysis, shortness of breath and wheezing.   Cardiovascular: Negative for chest pain and  leg swelling.  Gastrointestinal: Positive for constipation sometimes. Negative for abdominal pain, constipation, diarrhea, nausea and vomiting.  Genitourinary: Negative for bladder incontinence, difficulty urinating, dysuria (none at this time), frequency and hematuria.   Musculoskeletal: Negative for back pain, gait problem, neck pain and neck stiffness.  Skin: Negative for itching and rash.  Neurological: Negative for dizziness, extremity weakness, gait problem, headaches, light-headedness and seizures.  Hematological: Negative for adenopathy. Does not bruise/bleed easily.  Psychiatric/Behavioral: Negative for confusion, depression and sleep disturbance. The patient is not nervous/anxious   PHYSICAL EXAMINATION:  Blood pressure (!) 147/55, pulse 85, temperature 97.7 F (36.5 C), temperature source Oral, resp. rate 16, weight 165 lb 11.2 oz (75.2 kg), SpO2 97%.  ECOG PERFORMANCE STATUS: 1  Physical Exam  Constitutional: Oriented to person, place, and time and well-developed, well-nourished, and in no distress.  HENT:  Head: Normocephalic and atraumatic.  Mouth/Throat: Oropharynx is clear and  moist. No oropharyngeal exudate.  Eyes: Conjunctivae are normal. Right eye exhibits no discharge. Left eye exhibits no discharge. No scleral icterus.  Neck: Normal range of motion. Neck supple.  Cardiovascular: Normal rate, regular rhythm, normal heart sounds and intact distal pulses.   Pulmonary/Chest: Effort normal and breath sounds normal. No respiratory distress. No wheezes. No rales.  Abdominal: Soft. Bowel sounds are normal. Exhibits no distension and no mass. There is no tenderness.  Musculoskeletal: Normal range of motion. Exhibits no edema.  Lymphadenopathy:    No cervical adenopathy.  Neurological: Alert and oriented to person, place, and time. Exhibits normal muscle tone. Gait normal. Coordination normal.  Skin: Skin is warm and dry. No rash noted. Not diaphoretic. No erythema. No  pallor.  Psychiatric: Mood, memory and judgment normal.  Vitals reviewed.  LABORATORY DATA: Lab Results  Component Value Date   WBC 10.5 04/18/2023   HGB 11.5 (L) 04/18/2023   HCT 34.8 (L) 04/18/2023   MCV 99.1 04/18/2023   PLT 306 04/18/2023      Chemistry      Component Value Date/Time   NA 139 04/18/2023 1401   NA 140 11/03/2022 0844   NA 139 08/14/2017 0837   K 3.8 04/18/2023 1401   K 4.4 08/14/2017 0837   CL 109 04/18/2023 1401   CO2 22 04/18/2023 1401   CO2 24 08/14/2017 0837   BUN 29 (H) 04/18/2023 1401   BUN 33 (H) 11/03/2022 0844   BUN 13.3 08/14/2017 0837   CREATININE 1.06 (H) 04/18/2023 1401   CREATININE 0.8 08/14/2017 0837      Component Value Date/Time   CALCIUM 9.3 04/18/2023 1401   CALCIUM 9.3 08/14/2017 0837   ALKPHOS 87 04/18/2023 1401   ALKPHOS 107 08/14/2017 0837   AST 9 (L) 04/18/2023 1401   AST 12 08/14/2017 0837   ALT 9 04/18/2023 1401   ALT 12 08/14/2017 0837   BILITOT 0.3 04/18/2023 1401   BILITOT 0.37 08/14/2017 0837       RADIOGRAPHIC STUDIES:  No results found.   ASSESSMENT/PLAN:  This is a very pleasant 72 year old Caucasian female with stage IV non-small cell lung cancer, adenocarcinoma who presented with a right upper lobe lung mass in addition to a solitary brain metastatsis.  She was diagnosed in January 2015.    The patient had completed induction systemic chemotherapy with carboplatin and Alimta with a partial response.  The patient is currently being treated with single  agent maintenance Alimta.  She is status post 152 cycles.    Labs were reviewed. Recommend that she  proceed with #153 today as scheduled.    We will see her back for follow-up visit in 3 weeks for evaluation repeat blood work before undergoing cycle #154  She recently saw Dr. Mitzi Hansen to see if they would recommend SBRT to a small 6 mm nodule. Dr. Mitzi Hansen recommended close monitoring and would reconsider if lesion became more progressive/solid. Recommend  monitoring for now.   She is due for restaging CT scan at this time.  I will arrange for CT scan of the chest, abdomen, and pelvis prior to her next appointment  The patient wishes to be DNR regardless of the circumstance.  We have filled out DNR paperwork today and we will scanned this into her chart.  The patient was advised to call immediately if she has any concerning symptoms in the interval. The patient voices understanding of current disease status and treatment options and is in agreement with the current care  plan. All questions were answered. The patient knows to call the clinic with any problems, questions or concerns. We can certainly see the patient much sooner if necessary     Orders Placed This Encounter  Procedures   CT CHEST ABDOMEN PELVIS W CONTRAST    Standing Status:   Future    Standing Expiration Date:   04/17/2024    Order Specific Question:   If indicated for the ordered procedure, I authorize the administration of contrast media per Radiology protocol    Answer:   Yes    Order Specific Question:   Does the patient have a contrast media/X-ray dye allergy?    Answer:   Yes    Order Specific Question:   Preferred imaging location?    Answer:   [facility]    Order Specific Question:   If indicated for the ordered procedure, I authorize the administration of oral contrast media per Radiology protocol    Answer:   Yes     The total time spent in the appointment was 20-29 minutes  Katina Remick L Garnie Borchardt, PA-C 04/18/23

## 2023-04-17 DIAGNOSIS — R8271 Bacteriuria: Secondary | ICD-10-CM | POA: Diagnosis not present

## 2023-04-17 DIAGNOSIS — R300 Dysuria: Secondary | ICD-10-CM | POA: Diagnosis not present

## 2023-04-17 DIAGNOSIS — N3020 Other chronic cystitis without hematuria: Secondary | ICD-10-CM | POA: Diagnosis not present

## 2023-04-17 MED FILL — Dexamethasone Sodium Phosphate Inj 100 MG/10ML: INTRAMUSCULAR | Qty: 1 | Status: AC

## 2023-04-18 ENCOUNTER — Inpatient Hospital Stay: Payer: Medicare Other | Attending: Internal Medicine

## 2023-04-18 ENCOUNTER — Inpatient Hospital Stay: Payer: Medicare Other

## 2023-04-18 ENCOUNTER — Inpatient Hospital Stay (HOSPITAL_BASED_OUTPATIENT_CLINIC_OR_DEPARTMENT_OTHER): Payer: Medicare Other | Admitting: Physician Assistant

## 2023-04-18 VITALS — BP 147/55 | HR 85 | Temp 97.7°F | Resp 16 | Wt 165.7 lb

## 2023-04-18 VITALS — BP 127/54 | HR 76 | Resp 16

## 2023-04-18 DIAGNOSIS — C7931 Secondary malignant neoplasm of brain: Secondary | ICD-10-CM | POA: Insufficient documentation

## 2023-04-18 DIAGNOSIS — Z923 Personal history of irradiation: Secondary | ICD-10-CM | POA: Diagnosis not present

## 2023-04-18 DIAGNOSIS — Z9221 Personal history of antineoplastic chemotherapy: Secondary | ICD-10-CM | POA: Insufficient documentation

## 2023-04-18 DIAGNOSIS — C3411 Malignant neoplasm of upper lobe, right bronchus or lung: Secondary | ICD-10-CM

## 2023-04-18 DIAGNOSIS — Z5111 Encounter for antineoplastic chemotherapy: Secondary | ICD-10-CM

## 2023-04-18 DIAGNOSIS — Z5112 Encounter for antineoplastic immunotherapy: Secondary | ICD-10-CM | POA: Diagnosis not present

## 2023-04-18 DIAGNOSIS — Z79899 Other long term (current) drug therapy: Secondary | ICD-10-CM | POA: Insufficient documentation

## 2023-04-18 DIAGNOSIS — Z66 Do not resuscitate: Secondary | ICD-10-CM | POA: Insufficient documentation

## 2023-04-18 LAB — CBC WITH DIFFERENTIAL (CANCER CENTER ONLY)
Abs Immature Granulocytes: 0.08 10*3/uL — ABNORMAL HIGH (ref 0.00–0.07)
Basophils Absolute: 0 10*3/uL (ref 0.0–0.1)
Basophils Relative: 0 %
Eosinophils Absolute: 0 10*3/uL (ref 0.0–0.5)
Eosinophils Relative: 0 %
HCT: 34.8 % — ABNORMAL LOW (ref 36.0–46.0)
Hemoglobin: 11.5 g/dL — ABNORMAL LOW (ref 12.0–15.0)
Immature Granulocytes: 1 %
Lymphocytes Relative: 5 %
Lymphs Abs: 0.5 10*3/uL — ABNORMAL LOW (ref 0.7–4.0)
MCH: 32.8 pg (ref 26.0–34.0)
MCHC: 33 g/dL (ref 30.0–36.0)
MCV: 99.1 fL (ref 80.0–100.0)
Monocytes Absolute: 0.5 10*3/uL (ref 0.1–1.0)
Monocytes Relative: 5 %
Neutro Abs: 9.5 10*3/uL — ABNORMAL HIGH (ref 1.7–7.7)
Neutrophils Relative %: 89 %
Platelet Count: 306 10*3/uL (ref 150–400)
RBC: 3.51 MIL/uL — ABNORMAL LOW (ref 3.87–5.11)
RDW: 16.6 % — ABNORMAL HIGH (ref 11.5–15.5)
WBC Count: 10.5 10*3/uL (ref 4.0–10.5)
nRBC: 0 % (ref 0.0–0.2)

## 2023-04-18 LAB — CMP (CANCER CENTER ONLY)
ALT: 9 U/L (ref 0–44)
AST: 9 U/L — ABNORMAL LOW (ref 15–41)
Albumin: 4 g/dL (ref 3.5–5.0)
Alkaline Phosphatase: 87 U/L (ref 38–126)
Anion gap: 8 (ref 5–15)
BUN: 29 mg/dL — ABNORMAL HIGH (ref 8–23)
CO2: 22 mmol/L (ref 22–32)
Calcium: 9.3 mg/dL (ref 8.9–10.3)
Chloride: 109 mmol/L (ref 98–111)
Creatinine: 1.06 mg/dL — ABNORMAL HIGH (ref 0.44–1.00)
GFR, Estimated: 56 mL/min — ABNORMAL LOW (ref >60)
Glucose, Bld: 180 mg/dL — ABNORMAL HIGH (ref 70–99)
Potassium: 3.8 mmol/L (ref 3.5–5.1)
Sodium: 139 mmol/L (ref 135–145)
Total Bilirubin: 0.3 mg/dL (ref 0.3–1.2)
Total Protein: 6.8 g/dL (ref 6.5–8.1)

## 2023-04-18 MED ORDER — SODIUM CHLORIDE 0.9 % IV SOLN
500.0000 mg/m2 | Freq: Once | INTRAVENOUS | Status: AC
  Administered 2023-04-18: 900 mg via INTRAVENOUS
  Filled 2023-04-18: qty 20

## 2023-04-18 MED ORDER — SODIUM CHLORIDE 0.9 % IV SOLN
10.0000 mg | Freq: Once | INTRAVENOUS | Status: AC
  Administered 2023-04-18: 10 mg via INTRAVENOUS
  Filled 2023-04-18: qty 10

## 2023-04-18 MED ORDER — SODIUM CHLORIDE 0.9 % IV SOLN: Freq: Once | INTRAVENOUS | Status: AC

## 2023-04-18 NOTE — Patient Instructions (Signed)
[facility]  Discharge Instructions: Thank you for choosing [facility] to provide your oncology and hematology care.   If you have a lab appointment with the Cancer Center, please go directly to the Cancer Center and check in at the registration area.   Wear comfortable clothing and clothing appropriate for easy access to any Portacath or PICC line.   We strive to give you quality time with your provider. You may need to reschedule your appointment if you arrive late (15 or more minutes).  Arriving late affects you and other patients whose appointments are after yours.  Also, if you miss three or more appointments without notifying the office, you may be dismissed from the clinic at the provider's discretion.      For prescription refill requests, have your pharmacy contact our office and allow 72 hours for refills to be completed.    Today you received the following chemotherapy and/or immunotherapy agents: Alimta.       To help prevent nausea and vomiting after your treatment, we encourage you to take your nausea medication as directed.  BELOW ARE SYMPTOMS THAT SHOULD BE REPORTED IMMEDIATELY: *FEVER GREATER THAN 100.4 F (38 C) OR HIGHER *CHILLS OR SWEATING *NAUSEA AND VOMITING THAT IS NOT CONTROLLED WITH YOUR NAUSEA MEDICATION *UNUSUAL SHORTNESS OF BREATH *UNUSUAL BRUISING OR BLEEDING *URINARY PROBLEMS (pain or burning when urinating, or frequent urination) *BOWEL PROBLEMS (unusual diarrhea, constipation, pain near the anus) TENDERNESS IN MOUTH AND THROAT WITH OR WITHOUT PRESENCE OF ULCERS (sore throat, sores in mouth, or a toothache) UNUSUAL RASH, SWELLING OR PAIN  UNUSUAL VAGINAL DISCHARGE OR ITCHING   Items with * indicate a potential emergency and should be followed up as soon as possible or go to the Emergency Department if any problems should occur.  Please show the CHEMOTHERAPY ALERT CARD or IMMUNOTHERAPY ALERT CARD at  check-in to the Emergency Department and triage nurse.  Should you have questions after your visit or need to cancel or reschedule your appointment, please contact [facility]  Dept: [phone]  and follow the prompts.  Office hours are 8:00 a.m. to 4:30 p.m. Monday - Friday. Please note that voicemails left after 4:00 p.m. may not be returned until the following business day.  We are closed weekends and major holidays. You have access to a nurse at all times for urgent questions. Please call the main number to the clinic Dept: [phone] and follow the prompts.   For any non-urgent questions, you may also contact your provider using MyChart. We now offer e-Visits for anyone 18 and older to request care online for non-urgent symptoms. For details visit [email or web address].   Also download the MyChart app! Go to the app store, search "MyChart", open the app, select [facility], and log in with your MyChart username and password.   

## 2023-05-02 ENCOUNTER — Ambulatory Visit (HOSPITAL_COMMUNITY)
Admission: RE | Admit: 2023-05-02 | Discharge: 2023-05-02 | Disposition: A | Discharge status: Home / Self Care | Payer: Medicare Other | Source: Ambulatory Visit | Attending: Physician Assistant | Admitting: Physician Assistant

## 2023-05-02 DIAGNOSIS — C3411 Malignant neoplasm of upper lobe, right bronchus or lung: Secondary | ICD-10-CM | POA: Diagnosis not present

## 2023-05-02 DIAGNOSIS — K760 Fatty (change of) liver, not elsewhere classified: Secondary | ICD-10-CM | POA: Diagnosis not present

## 2023-05-02 DIAGNOSIS — I700 Atherosclerosis of aorta: Secondary | ICD-10-CM | POA: Diagnosis not present

## 2023-05-02 DIAGNOSIS — C3490 Malignant neoplasm of unspecified part of unspecified bronchus or lung: Secondary | ICD-10-CM | POA: Diagnosis not present

## 2023-05-02 DIAGNOSIS — R222 Localized swelling, mass and lump, trunk: Secondary | ICD-10-CM | POA: Diagnosis not present

## 2023-05-02 MED ORDER — IOHEXOL 300 MG/ML  SOLN
100.0000 mL | Freq: Once | INTRAMUSCULAR | Status: AC | PRN
  Administered 2023-05-02: 100 mL via INTRAVENOUS

## 2023-05-05 MED FILL — Dexamethasone Sodium Phosphate Inj 100 MG/10ML: INTRAMUSCULAR | Qty: 1 | Status: AC

## 2023-05-08 ENCOUNTER — Inpatient Hospital Stay: Payer: Medicare Other | Attending: Internal Medicine

## 2023-05-08 ENCOUNTER — Inpatient Hospital Stay (HOSPITAL_BASED_OUTPATIENT_CLINIC_OR_DEPARTMENT_OTHER): Payer: Medicare Other | Admitting: Internal Medicine

## 2023-05-08 ENCOUNTER — Inpatient Hospital Stay: Payer: Medicare Other

## 2023-05-08 VITALS — BP 120/55 | HR 84 | Resp 16

## 2023-05-08 DIAGNOSIS — C3411 Malignant neoplasm of upper lobe, right bronchus or lung: Secondary | ICD-10-CM

## 2023-05-08 DIAGNOSIS — C7931 Secondary malignant neoplasm of brain: Secondary | ICD-10-CM | POA: Insufficient documentation

## 2023-05-08 DIAGNOSIS — G4700 Insomnia, unspecified: Secondary | ICD-10-CM | POA: Diagnosis not present

## 2023-05-08 DIAGNOSIS — Z5112 Encounter for antineoplastic immunotherapy: Secondary | ICD-10-CM | POA: Insufficient documentation

## 2023-05-08 DIAGNOSIS — Z79899 Other long term (current) drug therapy: Secondary | ICD-10-CM | POA: Insufficient documentation

## 2023-05-08 DIAGNOSIS — D649 Anemia, unspecified: Secondary | ICD-10-CM | POA: Insufficient documentation

## 2023-05-08 LAB — CBC WITH DIFFERENTIAL (CANCER CENTER ONLY)
Abs Immature Granulocytes: 0.07 10*3/uL (ref 0.00–0.07)
Basophils Absolute: 0 10*3/uL (ref 0.0–0.1)
Basophils Relative: 0 %
Eosinophils Absolute: 0 10*3/uL (ref 0.0–0.5)
Eosinophils Relative: 0 %
HCT: 37.4 % (ref 36.0–46.0)
Hemoglobin: 12.3 g/dL (ref 12.0–15.0)
Immature Granulocytes: 1 %
Lymphocytes Relative: 4 %
Lymphs Abs: 0.4 10*3/uL — ABNORMAL LOW (ref 0.7–4.0)
MCH: 32.9 pg (ref 26.0–34.0)
MCHC: 32.9 g/dL (ref 30.0–36.0)
MCV: 100 fL (ref 80.0–100.0)
Monocytes Absolute: 0.4 10*3/uL (ref 0.1–1.0)
Monocytes Relative: 4 %
Neutro Abs: 9.5 10*3/uL — ABNORMAL HIGH (ref 1.7–7.7)
Neutrophils Relative %: 91 %
Platelet Count: 319 10*3/uL (ref 150–400)
RBC: 3.74 MIL/uL — ABNORMAL LOW (ref 3.87–5.11)
RDW: 15.8 % — ABNORMAL HIGH (ref 11.5–15.5)
WBC Count: 10.4 10*3/uL (ref 4.0–10.5)
nRBC: 0 % (ref 0.0–0.2)

## 2023-05-08 LAB — CMP (CANCER CENTER ONLY)
ALT: 8 U/L (ref 0–44)
AST: 9 U/L — ABNORMAL LOW (ref 15–41)
Albumin: 4.1 g/dL (ref 3.5–5.0)
Alkaline Phosphatase: 93 U/L (ref 38–126)
Anion gap: 7 (ref 5–15)
BUN: 24 mg/dL — ABNORMAL HIGH (ref 8–23)
CO2: 25 mmol/L (ref 22–32)
Calcium: 9.7 mg/dL (ref 8.9–10.3)
Chloride: 106 mmol/L (ref 98–111)
Creatinine: 1.09 mg/dL — ABNORMAL HIGH (ref 0.44–1.00)
GFR, Estimated: 54 mL/min — ABNORMAL LOW (ref >60)
Glucose, Bld: 156 mg/dL — ABNORMAL HIGH (ref 70–99)
Potassium: 4.1 mmol/L (ref 3.5–5.1)
Sodium: 138 mmol/L (ref 135–145)
Total Bilirubin: 0.4 mg/dL (ref 0.3–1.2)
Total Protein: 7 g/dL (ref 6.5–8.1)

## 2023-05-08 MED ORDER — SODIUM CHLORIDE 0.9 % IV SOLN: Freq: Once | INTRAVENOUS | Status: AC

## 2023-05-08 MED ORDER — SODIUM CHLORIDE 0.9 % IV SOLN
10.0000 mg | Freq: Once | INTRAVENOUS | Status: AC
  Administered 2023-05-08: 10 mg via INTRAVENOUS
  Filled 2023-05-08: qty 10

## 2023-05-08 MED ORDER — SODIUM CHLORIDE 0.9 % IV SOLN
500.0000 mg/m2 | Freq: Once | INTRAVENOUS | Status: AC
  Administered 2023-05-08: 900 mg via INTRAVENOUS
  Filled 2023-05-08: qty 20

## 2023-05-08 MED ORDER — CYANOCOBALAMIN 1000 MCG/ML IJ SOLN
1000.0000 ug | Freq: Once | INTRAMUSCULAR | Status: AC
  Administered 2023-05-08: 1000 ug via INTRAMUSCULAR
  Filled 2023-05-08: qty 1

## 2023-05-08 NOTE — Progress Notes (Signed)
[facility] [phone]   [phone]  OFFICE PROGRESS NOTE  Alexis Gaul, Alexis [name] [address] [place] [address]  DIAGNOSIS: Stage IV (T2a, N0, M1b) non-small cell lung cancer, adenocarcinoma with negative EGFR and ALK mutations diagnosed in January 2015 and presented with right upper lobe lung mass in addition to a solitary brain metastasis.  PRIOR THERAPY: 1) Status post stereotactic radiotherapy to a solitary right parietal brain lesion under the care of Dr. Mitzi Hansen on 10/16/2013. 2) Status post palliative radiotherapy to the right lung tumor under the care of Dr. Mitzi Hansen completed on 12/05/2013. 3) Systemic chemotherapy with carboplatin for AUC of 5 and Alimta 500 mg/M2 every 3 weeks. First dose Jan 06 2014. Status post 6 cycles.  CURRENT THERAPY: Systemic chemotherapy with maintenance Alimta 500 MG/M2 every 3 weeks, status post 153 cycles.  INTERVAL HISTORY: Alexis [name] 72 y.o. female returns to the clinic today for follow-up visit accompanied by her husband.  The patient is feeling fine today with no concerning complaints except for occasional back pain.  She denied having any current chest pain, shortness of breath, cough or hemoptysis.  She has no nausea, vomiting, diarrhea or constipation.  She has no headache or visual changes.  She denied having any recent weight loss or night sweats.  She continues to tolerate her maintenance treatment with Alimta fairly well.  She is here today for evaluation with repeat CT scan of the chest, abdomen and pelvis for restaging of her disease before starting cycle #154.  MEDICAL HISTORY: Past Medical History:  Diagnosis Date   Anxiety    Anxiety 06/20/2016   Cervical cancer (HCC)    Diabetes mellitus without complication (HCC)    patient states she has type 2   Encounter for antineoplastic chemotherapy 07/20/2015   Malignant neoplasm of right upper lobe of lung (HCC)     non small cell  lung cancer adenocarcioma with brain meta    ALLERGIES:  is allergic to glimepiride and codeine.  MEDICATIONS:  Current Outpatient Medications  Medication Sig Dispense Refill   acetaminophen (TYLENOL) 500 MG tablet Take 500 mg by mouth every 6 (six) hours as needed for mild pain or headache. Reported on 11/02/2015     Ascorbic Acid (VITAMIN C GUMMIE PO) Take 1 each by mouth every morning.     aspirin EC 81 MG tablet Take 1 tablet (81 mg total) by mouth daily. 150 tablet 2   Cranberry 240 MG CAPS Take 1 capsule by mouth daily. This is in her probiotic     [employer] ACID CONTROLLER 10 MG tablet SMARTSIG:1 Tablet(s) By Mouth Every 12 Hours PRN     dexamethasone (DECADRON) 4 MG tablet TAKE 1 TABLET TWICE A DAY THE DAY BEFORE, THE DAY OF, AND THE DAY AFTER CHEMOTHERAPY. 40 tablet 2   estradiol (ESTRACE) 0.1 MG/GM vaginal cream Place 1 Applicatorful vaginally daily.     folic acid (FOLVITE) 1 MG tablet TAKE 1 TABLET BY MOUTH EVERY DAY 90 tablet 0   glimepiride (AMARYL) 2 MG tablet TAKE 1 TABLET BY MOUTH DAILY BEFORE BREAKFAST. 90 tablet 0   loratadine (CLARITIN) 10 MG tablet Take 10 mg by mouth daily.     Multiple Vitamin (MULTIVITAMIN WITH MINERALS) TABS tablet Take 1 tablet by mouth every morning.     omeprazole (PRILOSEC) 20 MG capsule TAKE 1 CAPSULE BY MOUTH EVERY DAY 90 capsule 0   ondansetron (ZOFRAN) 8 MG tablet TAKE 1 TABLET BY  MOUTH BEFORE CHEMO 30 tablet 1   phenazopyridine (PYRIDIUM) 200 MG tablet Take 1 tablet (200 mg total) by mouth every 8 (eight) hours as needed. 10 tablet 0   prochlorperazine (COMPAZINE) 10 MG tablet Take 1 tablet (10 mg total) by mouth every 6 (six) hours as needed for nausea or vomiting. 60 tablet 0   rosuvastatin (CRESTOR) 10 MG tablet Take 1 tablet (10 mg total) by mouth daily. 30 tablet 12   senna-docusate (SENOKOT-S) 8.6-50 MG tablet Take 1 tablet by mouth daily. 30 tablet prn   temazepam (RESTORIL) 15 MG capsule Take 1 capsule (15 mg total) by mouth at bedtime as  needed for sleep. 30 capsule 0   trimethoprim (TRIMPEX) 100 MG tablet Take 100 mg by mouth daily.     Vitamin D, Ergocalciferol, (DRISDOL) 1.25 MG (50000 UNIT) CAPS capsule Take 50,000 Units by mouth once a week. Vit d 3     No current facility-administered medications for this visit.    SURGICAL HISTORY:  Past Surgical History:  Procedure Laterality Date   ABDOMINAL HYSTERECTOMY     COLOSTOMY TAKEDOWN N/A 07/10/2014   Procedure: LAPAROSCOPIC LYSIS OF ADHESIONS (90 MIN) LAPAROSCOPIC ASSISTED COLOSTOMY CLOSURE, RIGID PROCTOSIGMOIDOSCOPY;  Surgeon: Avel Peace, Alexis [name];  Location: [facility];  Service: General;  Laterality: N/A;   LAPAROTOMY N/A 11/03/2013   Procedure: EXPLORATORY LAPAROTOMY, DRAINAGE OF INTRA  ABDOMINAL ABSCESSES, MOBILIZATION OF SPLENIC FLEXURE, SIGMOID COLECTOMY WITH COLOSTOMY;  Surgeon: Adolph Pollack, Alexis [name];  Location: [facility];  Service: General;  Laterality: N/A;   VIDEO BRONCHOSCOPY Bilateral 08/30/2013   Procedure: VIDEO BRONCHOSCOPY WITH FLUORO;  Surgeon: Nyoka Cowden, Alexis [name];  Location: [facility];  Service: Cardiopulmonary;  Laterality: Bilateral;    REVIEW OF SYSTEMS:  Constitutional: negative Eyes: negative Ears, nose, mouth, throat, and face: negative Respiratory: negative Cardiovascular: negative Gastrointestinal: negative Genitourinary:negative Integument/breast: negative Hematologic/lymphatic: negative Musculoskeletal:positive for back pain Neurological: negative Behavioral/Psych: negative Endocrine: negative Allergic/Immunologic: negative   PHYSICAL EXAMINATION: General appearance: alert, cooperative, and no distress Head: Normocephalic, without obvious abnormality, atraumatic Neck: no adenopathy, no JVD, supple, symmetrical, trachea midline, and thyroid not enlarged, symmetric, no tenderness/mass/nodules Lymph nodes: Cervical, supraclavicular, and axillary nodes normal. Resp: clear to auscultation bilaterally Back: symmetric, no curvature. ROM normal. No  CVA tenderness. Cardio: regular rate and rhythm, S1, S2 normal, no murmur, click, rub or gallop GI: soft, non-tender; bowel sounds normal; no masses,  no organomegaly Extremities: extremities normal, atraumatic, no cyanosis or edema Neurologic: Alert and oriented X 3, normal strength and tone. Normal symmetric reflexes. Normal coordination and gait   ECOG PERFORMANCE STATUS: 1 - Symptomatic but completely ambulatory   Blood pressure 128/63, pulse 81, temperature 98.1 F (36.7 C), temperature source Oral, resp. rate 17, height 5\' 4"  (1.626 m), weight 162 lb (73.5 kg), SpO2 100%.  LABORATORY DATA: Lab Results  Component Value Date   WBC 10.4 05/08/2023   HGB 12.3 05/08/2023   HCT 37.4 05/08/2023   MCV 100.0 05/08/2023   PLT 319 05/08/2023      Chemistry      Component Value Date/Time   NA 139 04/18/2023 1401   NA 140 11/03/2022 0844   NA 139 08/14/2017 0837   K 3.8 04/18/2023 1401   K 4.4 08/14/2017 0837   CL 109 04/18/2023 1401   CO2 22 04/18/2023 1401   CO2 24 08/14/2017 0837   BUN 29 (H) 04/18/2023 1401   BUN 33 (H) 11/03/2022 0844   BUN 13.3 08/14/2017 0837   CREATININE 1.06 (H)  04/18/2023 1401   CREATININE 0.8 08/14/2017 0837      Component Value Date/Time   CALCIUM 9.3 04/18/2023 1401   CALCIUM 9.3 08/14/2017 0837   ALKPHOS 87 04/18/2023 1401   ALKPHOS 107 08/14/2017 0837   AST 9 (L) 04/18/2023 1401   AST 12 08/14/2017 0837   ALT 9 04/18/2023 1401   ALT 12 08/14/2017 0837   BILITOT 0.3 04/18/2023 1401   BILITOT 0.37 08/14/2017 0837       RADIOGRAPHIC STUDIES: CT CHEST ABDOMEN PELVIS W CONTRAST  Result Date: 05/05/2023 CLINICAL DATA:  Non-small cell lung cancer restaging, metastatic disease evaluation * Tracking Code: BO * EXAM: CT CHEST, ABDOMEN, AND PELVIS WITH CONTRAST TECHNIQUE: Multidetector CT imaging of the chest, abdomen and pelvis was performed following the standard protocol during bolus administration of intravenous contrast. RADIATION DOSE  REDUCTION: This exam was performed according to the departmental dose-optimization program which includes automated exposure control, adjustment of the mA and/or kV according to patient size and/or use of iterative reconstruction technique. CONTRAST:  OMNIPAQUE IOHEXOL 300 MG/ML  SOLN COMPARISON:  02/09/2023 FINDINGS: CT CHEST FINDINGS Cardiovascular: Aortic atherosclerosis. Normal heart size. No pericardial effusion. Mediastinum/Nodes: No enlarged mediastinal, hilar, or axillary lymph nodes. Thyroid gland, trachea, and esophagus demonstrate no significant findings. Lungs/Pleura: Unchanged post treatment appearance of the right chest, fibrosis and volume loss involving a treated right apical mass (series 4, image 27). Moderate centrilobular and paraseptal emphysema. Unchanged small ground-glass nodules of the dependent right lung base measuring 0.5 cm (series 4, image 88) and 0.6 cm (series 4, image 101). No pleural effusion or pneumothorax. Musculoskeletal: No chest wall abnormality. No acute osseous findings. CT ABDOMEN PELVIS FINDINGS Hepatobiliary: No solid liver abnormality is seen. Hepatic steatosis. No gallstones, gallbladder wall thickening, or biliary dilatation. Pancreas: Unremarkable. No pancreatic ductal dilatation or surrounding inflammatory changes. Spleen: Normal in size without significant abnormality. Adrenals/Urinary Tract: Adrenal glands are unremarkable. Kidneys are normal, without renal calculi, solid lesion, or hydronephrosis. Bladder is unremarkable. Stomach/Bowel: Stomach is within normal limits. Appendix appears normal. No evidence of bowel wall thickening, distention, or inflammatory changes. Status post sigmoid colon resection and reanastomosis. Vascular/Lymphatic: Aortic atherosclerosis. No enlarged abdominal or pelvic lymph nodes. Reproductive: Status post hysterectomy. Other: Unchanged fat containing midline ventral and umbilical hernias (series 2, image 92). No ascites.  Musculoskeletal: No acute osseous findings. IMPRESSION: 1. Unchanged post treatment appearance of the right chest, fibrosis and volume loss involving a treated right apical mass. 2. Unchanged small ground-glass nodules of the dependent right lung base measuring 0.5 cm and 0.6 cm. Attention on follow-up. 3. No evidence of lymphadenopathy or metastatic disease in the chest, abdomen, or pelvis. 4. Hepatic steatosis. 5. Status post sigmoid colon resection and reanastomosis. 6. Unchanged fat containing midline ventral and umbilical hernias. Aortic Atherosclerosis (ICD10-I70.0) and Emphysema (ICD10-J43.9). Electronically Signed   By: Jearld Lesch M.D.   On: 05/05/2023 11:25    ASSESSMENT AND PLAN:  This is a very pleasant 72 years old white female with metastatic non-small cell lung cancer, adenocarcinoma status post induction systemic chemotherapy with carboplatin and Alimta with partial response.  The patient has no actionable EGFR or ALK mutations at the time of her diagnosis. The patient is currently on maintenance treatment with single agent Alimta status post 153 cycles. The patient has been tolerating this treatment well with no concerning adverse effects. She had repeat CT scan of the chest, abdomen and pelvis performed recently.  I personally and independently reviewed the scan and discussed  the result with the patient today. Her scan showed no concerning findings for disease progression. I recommended for her to continue her current maintenance treatment with Alimta and she will proceed with cycle #154 today. For the anemia, the patient will continue on the oral iron tablets. She will come back for follow-up visit in 3 weeks for evaluation before the next cycle of her treatment. She was advised to call immediately if she has any other concerning symptoms in the interval.  The patient voices understanding of current disease status and treatment options and is in agreement with the current care  plan. All questions were answered. The patient knows to call the clinic with any problems, questions or concerns. We can certainly see the patient much sooner if necessary.  Disclaimer: This note was dictated with voice recognition software. Similar sounding words can inadvertently be transcribed and may not be corrected upon review.

## 2023-05-08 NOTE — Patient Instructions (Signed)
[facility]   Discharge Instructions: Thank you for choosing [facility] to provide your oncology and hematology care.   If you have a lab appointment with the [facility], please go directly to the [facility] and check in at the registration area.   Wear comfortable clothing and clothing appropriate for easy access to any Portacath or PICC line.   We strive to give you quality time with your provider. You may need to reschedule your appointment if you arrive late (15 or more minutes).  Arriving late affects you and other patients whose appointments are after yours.  Also, if you miss three or more appointments without notifying the office, you may be dismissed from the clinic at the provider's discretion.      For prescription refill requests, have your pharmacy contact our office and allow 72 hours for refills to be completed.    Today you received the following chemotherapy and/or immunotherapy agents: Pemetrexed (Alimta)      To help prevent nausea and vomiting after your treatment, we encourage you to take your nausea medication as directed.  BELOW ARE SYMPTOMS THAT SHOULD BE REPORTED IMMEDIATELY: *FEVER GREATER THAN 100.4 F (38 C) OR HIGHER *CHILLS OR SWEATING *NAUSEA AND VOMITING THAT IS NOT CONTROLLED WITH YOUR NAUSEA MEDICATION *UNUSUAL SHORTNESS OF BREATH *UNUSUAL BRUISING OR BLEEDING *URINARY PROBLEMS (pain or burning when urinating, or frequent urination) *BOWEL PROBLEMS (unusual diarrhea, constipation, pain near the anus) TENDERNESS IN MOUTH AND THROAT WITH OR WITHOUT PRESENCE OF ULCERS (sore throat, sores in mouth, or a toothache) UNUSUAL RASH, SWELLING OR PAIN  UNUSUAL VAGINAL DISCHARGE OR ITCHING   Items with * indicate a potential emergency and should be followed up as soon as possible or go to the Emergency Department if any problems should occur.  Please show the CHEMOTHERAPY ALERT CARD or IMMUNOTHERAPY ALERT  CARD at check-in to the Emergency Department and triage nurse.  Should you have questions after your visit or need to cancel or reschedule your appointment, please contact [facility]  Dept: [phone]  and follow the prompts.  Office hours are 8:00 a.m. to 4:30 p.m. Monday - Friday. Please note that voicemails left after 4:00 p.m. may not be returned until the following business day.  We are closed weekends and major holidays. You have access to a nurse at all times for urgent questions. Please call the main number to the clinic Dept: [phone] and follow the prompts.   For any non-urgent questions, you may also contact your provider using MyChart. We now offer e-Visits for anyone 91 and older to request care online for non-urgent symptoms. For details visit [email or web address].   Also download the MyChart app! Go to the app store, search "MyChart", open the app, select [facility], and log in with your MyChart username and password.

## 2023-05-12 ENCOUNTER — Other Ambulatory Visit: Payer: Self-pay | Admitting: Physician Assistant

## 2023-05-12 DIAGNOSIS — C3490 Malignant neoplasm of unspecified part of unspecified bronchus or lung: Secondary | ICD-10-CM

## 2023-05-26 MED FILL — Dexamethasone Sodium Phosphate Inj 100 MG/10ML: INTRAMUSCULAR | Qty: 1 | Status: AC

## 2023-05-29 ENCOUNTER — Inpatient Hospital Stay: Payer: Medicare Other

## 2023-05-29 ENCOUNTER — Inpatient Hospital Stay (HOSPITAL_BASED_OUTPATIENT_CLINIC_OR_DEPARTMENT_OTHER): Payer: Medicare Other | Admitting: Internal Medicine

## 2023-05-29 VITALS — BP 140/65 | HR 81

## 2023-05-29 DIAGNOSIS — D649 Anemia, unspecified: Secondary | ICD-10-CM | POA: Diagnosis not present

## 2023-05-29 DIAGNOSIS — C3411 Malignant neoplasm of upper lobe, right bronchus or lung: Secondary | ICD-10-CM

## 2023-05-29 DIAGNOSIS — C7931 Secondary malignant neoplasm of brain: Secondary | ICD-10-CM | POA: Diagnosis not present

## 2023-05-29 DIAGNOSIS — Z5112 Encounter for antineoplastic immunotherapy: Secondary | ICD-10-CM | POA: Diagnosis not present

## 2023-05-29 DIAGNOSIS — Z79899 Other long term (current) drug therapy: Secondary | ICD-10-CM | POA: Diagnosis not present

## 2023-05-29 DIAGNOSIS — G4700 Insomnia, unspecified: Secondary | ICD-10-CM | POA: Diagnosis not present

## 2023-05-29 LAB — CBC WITH DIFFERENTIAL (CANCER CENTER ONLY)
Abs Immature Granulocytes: 0.05 10*3/uL (ref 0.00–0.07)
Basophils Absolute: 0 10*3/uL (ref 0.0–0.1)
Basophils Relative: 0 %
Eosinophils Absolute: 0 10*3/uL (ref 0.0–0.5)
Eosinophils Relative: 0 %
HCT: 37.1 % (ref 36.0–46.0)
Hemoglobin: 11.8 g/dL — ABNORMAL LOW (ref 12.0–15.0)
Immature Granulocytes: 0 %
Lymphocytes Relative: 4 %
Lymphs Abs: 0.5 10*3/uL — ABNORMAL LOW (ref 0.7–4.0)
MCH: 32.2 pg (ref 26.0–34.0)
MCHC: 31.8 g/dL (ref 30.0–36.0)
MCV: 101.1 fL — ABNORMAL HIGH (ref 80.0–100.0)
Monocytes Absolute: 0.6 10*3/uL (ref 0.1–1.0)
Monocytes Relative: 5 %
Neutro Abs: 10.6 10*3/uL — ABNORMAL HIGH (ref 1.7–7.7)
Neutrophils Relative %: 91 %
Platelet Count: 291 10*3/uL (ref 150–400)
RBC: 3.67 MIL/uL — ABNORMAL LOW (ref 3.87–5.11)
RDW: 15.7 % — ABNORMAL HIGH (ref 11.5–15.5)
WBC Count: 11.7 10*3/uL — ABNORMAL HIGH (ref 4.0–10.5)
nRBC: 0 % (ref 0.0–0.2)

## 2023-05-29 LAB — CMP (CANCER CENTER ONLY)
ALT: 10 U/L (ref 0–44)
AST: 11 U/L — ABNORMAL LOW (ref 15–41)
Albumin: 4.1 g/dL (ref 3.5–5.0)
Alkaline Phosphatase: 94 U/L (ref 38–126)
Anion gap: 8 (ref 5–15)
BUN: 19 mg/dL (ref 8–23)
CO2: 26 mmol/L (ref 22–32)
Calcium: 9.9 mg/dL (ref 8.9–10.3)
Chloride: 103 mmol/L (ref 98–111)
Creatinine: 0.89 mg/dL (ref 0.44–1.00)
GFR, Estimated: >60 mL/min (ref >60)
Glucose, Bld: 146 mg/dL — ABNORMAL HIGH (ref 70–99)
Potassium: 4.3 mmol/L (ref 3.5–5.1)
Sodium: 137 mmol/L (ref 135–145)
Total Bilirubin: 0.4 mg/dL (ref 0.3–1.2)
Total Protein: 6.9 g/dL (ref 6.5–8.1)

## 2023-05-29 MED ORDER — SODIUM CHLORIDE 0.9 % IV SOLN: Freq: Once | INTRAVENOUS | Status: AC

## 2023-05-29 MED ORDER — SODIUM CHLORIDE 0.9 % IV SOLN
10.0000 mg | Freq: Once | INTRAVENOUS | Status: AC
  Administered 2023-05-29: 10 mg via INTRAVENOUS
  Filled 2023-05-29: qty 10

## 2023-05-29 MED ORDER — SODIUM CHLORIDE 0.9 % IV SOLN
500.0000 mg/m2 | Freq: Once | INTRAVENOUS | Status: AC
  Administered 2023-05-29: 900 mg via INTRAVENOUS
  Filled 2023-05-29: qty 20

## 2023-05-29 NOTE — Progress Notes (Signed)
[facility] Telephone:([phone]   Fax:(336) [phone]  OFFICE PROGRESS NOTE  [name], MD [address] [place] [address]  DIAGNOSIS: Stage IV (T2a, N0, M1b) non-small cell lung cancer, adenocarcinoma with negative EGFR and ALK mutations diagnosed in January 2015 and presented with right upper lobe lung mass in addition to a solitary brain metastasis.  PRIOR THERAPY: 1) Status post stereotactic radiotherapy to a solitary right parietal brain lesion under the care of Dr. Mitzi Hansen on 10/16/2013. 2) Status post palliative radiotherapy to the right lung tumor under the care of Dr. Mitzi Hansen completed on 12/05/2013. 3) Systemic chemotherapy with carboplatin for AUC of 5 and Alimta 500 mg/M2 every 3 weeks. First dose Jan 06 2014. Status post 6 cycles.  CURRENT THERAPY: Systemic chemotherapy with maintenance Alimta 500 MG/M2 every 3 weeks, status post 154 cycles.  INTERVAL HISTORY: Alexis [name] 72 y.o. female returns to the clinic today for follow-up visit accompanied by her husband Alexis [name]. Discussed the use of AI scribe software for clinical note transcription with the patient, who gave verbal consent to proceed.  History of Present Illness   The patient, Alexis [name], a 72 year old with a history of stage 4 non-small cell lung cancer adenocarcinoma, diagnosed in January 2015, has been on maintenance Alimta following six cycles of carboplatin and Alimta chemotherapy. The patient is currently post 154 cycles and is due for the 155th cycle.  The primary concern expressed by the patient is persistent insomnia. She reports difficulty maintaining sleep, often waking up after dozing off and being unable to return to sleep for the rest of the night. This has led to daytime fatigue and the need for occasional naps. The patient denies any use of over-the-counter sleep aids and does not attribute the sleep disturbance to stress, anxiety, or physical  discomfort.  The patient denies any new physical complaints, including nausea, vomiting, diarrhea, chest pain, breathing issues, or weight loss. Her weight has remained stable at 164 lbs. The patient also denies any nocturnal urinary frequency that could potentially disrupt sleep.  The patient has recently completed a living will and last testament, indicating a desire to have this information on file with the medical team.       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  MEDICATIONS:  Current Outpatient Medications  Medication Sig Dispense Refill   acetaminophen (TYLENOL) 500 MG tablet Take 500 mg by mouth every 6 (six) hours as needed for mild pain or headache. Reported on 11/02/2015     Ascorbic Acid (VITAMIN C GUMMIE PO) Take 1 each by mouth every morning.     aspirin EC 81 MG tablet Take 1 tablet (81 mg total) by mouth daily. 150 tablet 2   Cranberry 240 MG CAPS Take 1 capsule by mouth daily. This is in her probiotic     [employer] ACID CONTROLLER 10 MG tablet SMARTSIG:1 Tablet(s) By Mouth Every 12 Hours PRN     dexamethasone (DECADRON) 4 MG tablet TAKE 1 TABLET TWICE A DAY THE DAY BEFORE, THE DAY OF,  AND THE DAY AFTER CHEMOTHERAPY. 40 tablet 2   estradiol (ESTRACE) 0.1 MG/GM vaginal cream Place 1 Applicatorful vaginally daily.     folic acid (FOLVITE) 1 MG tablet TAKE 1 TABLET BY MOUTH EVERY DAY 90 tablet 0   glimepiride (AMARYL) 2 MG tablet TAKE 1 TABLET BY MOUTH DAILY BEFORE BREAKFAST. 90 tablet 0   loratadine (CLARITIN) 10 MG tablet Take 10 mg by mouth daily.     Multiple Vitamin (MULTIVITAMIN WITH MINERALS)  TABS tablet Take 1 tablet by mouth every morning.     omeprazole (PRILOSEC) 20 MG capsule TAKE 1 CAPSULE BY MOUTH EVERY DAY 90 capsule 0   ondansetron (ZOFRAN) 8 MG tablet TAKE 1 TABLET BY MOUTH BEFORE CHEMO 30 tablet 1   phenazopyridine (PYRIDIUM) 200 MG tablet Take 1 tablet (200 mg total) by mouth every 8 (eight) hours as needed. 10 tablet 0   prochlorperazine (COMPAZINE) 10 MG tablet Take 1 tablet (10 mg total) by mouth every 6 (six) hours as needed for nausea or vomiting. 60 tablet 0   rosuvastatin (CRESTOR) 10 MG tablet Take 1 tablet (10 mg total) by mouth daily. 30 tablet 12   senna-docusate (SENOKOT-S) 8.6-50 MG tablet Take 1 tablet by mouth daily. 30 tablet prn   temazepam (RESTORIL) 15 MG capsule Take 1 capsule (15 mg total) by mouth at bedtime as needed for sleep. 30 capsule 0   trimethoprim (TRIMPEX) 100 MG tablet Take 100 mg by mouth daily.     Vitamin D, Ergocalciferol, (DRISDOL) 1.25 MG (50000 UNIT) CAPS capsule Take 50,000 Units by mouth once a week. Vit d 3     No current facility-administered medications for this visit.    SURGICAL HISTORY:  Past Surgical History:  Procedure Laterality Date   ABDOMINAL HYSTERECTOMY     COLOSTOMY TAKEDOWN N/A 07/10/2014   Procedure: LAPAROSCOPIC LYSIS OF ADHESIONS (90 MIN) LAPAROSCOPIC ASSISTED COLOSTOMY CLOSURE, RIGID PROCTOSIGMOIDOSCOPY;  Surgeon: Avel Peace, MD;  Location: [facility];  Service: General;  Laterality: N/A;   LAPAROTOMY N/A 11/03/2013   Procedure: EXPLORATORY LAPAROTOMY, DRAINAGE OF INTRA  ABDOMINAL ABSCESSES, MOBILIZATION OF SPLENIC FLEXURE, SIGMOID COLECTOMY WITH COLOSTOMY;  Surgeon: Adolph Pollack, MD;  Location: [facility];  Service: General;  Laterality: N/A;   VIDEO BRONCHOSCOPY Bilateral 08/30/2013   Procedure: VIDEO BRONCHOSCOPY WITH FLUORO;  Surgeon: Nyoka Cowden, MD;  Location: [facility];  Service: Cardiopulmonary;  Laterality: Bilateral;    REVIEW OF SYSTEMS:  A comprehensive review of systems was negative except  for: Constitutional: positive for fatigue Behavioral/Psych: positive for sleep disturbance   PHYSICAL EXAMINATION: General appearance: alert, cooperative, and no distress Head: Normocephalic, without obvious abnormality, atraumatic Neck: no adenopathy, no JVD, supple, symmetrical, trachea midline, and thyroid not enlarged, symmetric, no tenderness/mass/nodules Lymph nodes: Cervical, supraclavicular, and axillary nodes normal. Resp: clear to auscultation bilaterally Back: symmetric, no curvature. ROM normal. No CVA tenderness. Cardio: regular rate and rhythm, S1, S2 normal, no murmur, click, rub or gallop GI: soft, non-tender; bowel sounds normal; no masses,  no organomegaly Extremities: extremities normal, atraumatic, no cyanosis or edema   ECOG PERFORMANCE STATUS: 1 - Symptomatic but completely ambulatory   Blood pressure 129/67, pulse 68, temperature 97.6 F (36.4 C), temperature source Oral, resp. rate 17, height 5\' 4"  (1.626 m), weight 164 lb 4.8 oz (74.5 kg), SpO2 100%.  LABORATORY DATA: Lab Results  Component Value Date   WBC 11.7 (H) 05/29/2023   HGB 11.8 (L) 05/29/2023   HCT 37.1 05/29/2023   MCV 101.1 (H) 05/29/2023  PLT 291 05/29/2023      Chemistry      Component Value Date/Time   NA 138 05/08/2023 1047   NA 140 11/03/2022 0844   NA 139 08/14/2017 0837   K 4.1 05/08/2023 1047   K 4.4 08/14/2017 0837   CL 106 05/08/2023 1047   CO2 25 05/08/2023 1047   CO2 24 08/14/2017 0837   BUN 24 (H) 05/08/2023 1047   BUN 33 (H) 11/03/2022 0844   BUN 13.3 08/14/2017 0837   CREATININE 1.09 (H) 05/08/2023 1047   CREATININE 0.8 08/14/2017 0837      Component Value Date/Time   CALCIUM 9.7 05/08/2023 1047   CALCIUM 9.3 08/14/2017 0837   ALKPHOS 93 05/08/2023 1047   ALKPHOS 107 08/14/2017 0837   AST 9 (L) 05/08/2023 1047   AST 12 08/14/2017 0837   ALT 8 05/08/2023 1047   ALT 12 08/14/2017 0837   BILITOT 0.4 05/08/2023 1047   BILITOT 0.37 08/14/2017 0837        RADIOGRAPHIC STUDIES: CT CHEST ABDOMEN PELVIS W CONTRAST  Result Date: 05/05/2023 CLINICAL DATA:  Non-small cell lung cancer restaging, metastatic disease evaluation * Tracking Code: BO * EXAM: CT CHEST, ABDOMEN, AND PELVIS WITH CONTRAST TECHNIQUE: Multidetector CT imaging of the chest, abdomen and pelvis was performed following the standard protocol during bolus administration of intravenous contrast. RADIATION DOSE REDUCTION: This exam was performed according to the departmental dose-optimization program which includes automated exposure control, adjustment of the mA and/or kV according to patient size and/or use of iterative reconstruction technique. CONTRAST:  OMNIPAQUE IOHEXOL 300 MG/ML  SOLN COMPARISON:  02/09/2023 FINDINGS: CT CHEST FINDINGS Cardiovascular: Aortic atherosclerosis. Normal heart size. No pericardial effusion. Mediastinum/Nodes: No enlarged mediastinal, hilar, or axillary lymph nodes. Thyroid gland, trachea, and esophagus demonstrate no significant findings. Lungs/Pleura: Unchanged post treatment appearance of the right chest, fibrosis and volume loss involving a treated right apical mass (series 4, image 27). Moderate centrilobular and paraseptal emphysema. Unchanged small ground-glass nodules of the dependent right lung base measuring 0.5 cm (series 4, image 88) and 0.6 cm (series 4, image 101). No pleural effusion or pneumothorax. Musculoskeletal: No chest wall abnormality. No acute osseous findings. CT ABDOMEN PELVIS FINDINGS Hepatobiliary: No solid liver abnormality is seen. Hepatic steatosis. No gallstones, gallbladder wall thickening, or biliary dilatation. Pancreas: Unremarkable. No pancreatic ductal dilatation or surrounding inflammatory changes. Spleen: Normal in size without significant abnormality. Adrenals/Urinary Tract: Adrenal glands are unremarkable. Kidneys are normal, without renal calculi, solid lesion, or hydronephrosis. Bladder is unremarkable. Stomach/Bowel:  Stomach is within normal limits. Appendix appears normal. No evidence of bowel wall thickening, distention, or inflammatory changes. Status post sigmoid colon resection and reanastomosis. Vascular/Lymphatic: Aortic atherosclerosis. No enlarged abdominal or pelvic lymph nodes. Reproductive: Status post hysterectomy. Other: Unchanged fat containing midline ventral and umbilical hernias (series 2, image 92). No ascites. Musculoskeletal: No acute osseous findings. IMPRESSION: 1. Unchanged post treatment appearance of the right chest, fibrosis and volume loss involving a treated right apical mass. 2. Unchanged small ground-glass nodules of the dependent right lung base measuring 0.5 cm and 0.6 cm. Attention on follow-up. 3. No evidence of lymphadenopathy or metastatic disease in the chest, abdomen, or pelvis. 4. Hepatic steatosis. 5. Status post sigmoid colon resection and reanastomosis. 6. Unchanged fat containing midline ventral and umbilical hernias. Aortic Atherosclerosis (ICD10-I70.0) and Emphysema (ICD10-J43.9). Electronically Signed   By: Jearld Lesch M.D.   On: 05/05/2023 11:25    ASSESSMENT AND PLAN:  This is a very pleasant 72  years old white female with metastatic non-small cell lung cancer, adenocarcinoma status post induction systemic chemotherapy with carboplatin and Alimta with partial response.  The patient has no actionable EGFR or ALK mutations at the time of her diagnosis. The patient is currently on maintenance treatment with single agent Alimta status post 154 cycles. She has been tolerating this treatment well with no concerning adverse effects.    Stage 4 Non-Small Cell Lung Cancer (Adenocarcinoma) Stable disease on maintenance Alimta. No new physical complaints or symptoms related to disease or treatment. -Continue with cycle 155 of Alimta.  Insomnia Reports difficulty maintaining sleep, not related to nocturia. No current use of sleep aids. No reported stress or anxiety. -Trial of  over-the-counter sleep aids such as Benadryl or Tylenol PM.  Living Will Patient has completed a living will and wishes to share it with the medical team. -Review living will at next visit for understanding of patient's wishes regarding code status and other medical decisions.    For the anemia, the patient will continue on the oral iron tablets. She was advised to call immediately if she has any other concerning symptoms in the interval.  The patient voices understanding of current disease status and treatment options and is in agreement with the current care plan. All questions were answered. The patient knows to call the clinic with any problems, questions or concerns. We can certainly see the patient much sooner if necessary.  Disclaimer: This note was dictated with voice recognition software. Similar sounding words can inadvertently be transcribed and may not be corrected upon review.

## 2023-05-29 NOTE — Patient Instructions (Signed)
[facility]  Discharge Instructions: Thank you for choosing [facility] to provide your oncology and hematology care.   If you have a lab appointment with the Cancer Center, please go directly to the Cancer Center and check in at the registration area.   Wear comfortable clothing and clothing appropriate for easy access to any Portacath or PICC line.   We strive to give you quality time with your provider. You may need to reschedule your appointment if you arrive late (15 or more minutes).  Arriving late affects you and other patients whose appointments are after yours.  Also, if you miss three or more appointments without notifying the office, you may be dismissed from the clinic at the provider's discretion.      For prescription refill requests, have your pharmacy contact our office and allow 72 hours for refills to be completed.    Today you received the following chemotherapy and/or immunotherapy agents: Alimta.       To help prevent nausea and vomiting after your treatment, we encourage you to take your nausea medication as directed.  BELOW ARE SYMPTOMS THAT SHOULD BE REPORTED IMMEDIATELY: *FEVER GREATER THAN 100.4 F (38 C) OR HIGHER *CHILLS OR SWEATING *NAUSEA AND VOMITING THAT IS NOT CONTROLLED WITH YOUR NAUSEA MEDICATION *UNUSUAL SHORTNESS OF BREATH *UNUSUAL BRUISING OR BLEEDING *URINARY PROBLEMS (pain or burning when urinating, or frequent urination) *BOWEL PROBLEMS (unusual diarrhea, constipation, pain near the anus) TENDERNESS IN MOUTH AND THROAT WITH OR WITHOUT PRESENCE OF ULCERS (sore throat, sores in mouth, or a toothache) UNUSUAL RASH, SWELLING OR PAIN  UNUSUAL VAGINAL DISCHARGE OR ITCHING   Items with * indicate a potential emergency and should be followed up as soon as possible or go to the Emergency Department if any problems should occur.  Please show the CHEMOTHERAPY ALERT CARD or IMMUNOTHERAPY ALERT CARD at  check-in to the Emergency Department and triage nurse.  Should you have questions after your visit or need to cancel or reschedule your appointment, please contact [facility]  Dept: [phone]  and follow the prompts.  Office hours are 8:00 a.m. to 4:30 p.m. Monday - Friday. Please note that voicemails left after 4:00 p.m. may not be returned until the following business day.  We are closed weekends and major holidays. You have access to a nurse at all times for urgent questions. Please call the main number to the clinic Dept: [phone] and follow the prompts.   For any non-urgent questions, you may also contact your provider using MyChart. We now offer e-Visits for anyone 18 and older to request care online for non-urgent symptoms. For details visit [email or web address].   Also download the MyChart app! Go to the app store, search "MyChart", open the app, select [facility], and log in with your MyChart username and password.   

## 2023-06-19 ENCOUNTER — Inpatient Hospital Stay: Payer: Medicare Other | Attending: Internal Medicine

## 2023-06-19 ENCOUNTER — Inpatient Hospital Stay: Payer: Medicare Other

## 2023-06-19 ENCOUNTER — Inpatient Hospital Stay (HOSPITAL_BASED_OUTPATIENT_CLINIC_OR_DEPARTMENT_OTHER): Payer: Medicare Other | Admitting: Internal Medicine

## 2023-06-19 VITALS — BP 151/60 | HR 89 | Temp 97.8°F | Resp 16

## 2023-06-19 DIAGNOSIS — C3411 Malignant neoplasm of upper lobe, right bronchus or lung: Secondary | ICD-10-CM

## 2023-06-19 DIAGNOSIS — Z9221 Personal history of antineoplastic chemotherapy: Secondary | ICD-10-CM | POA: Insufficient documentation

## 2023-06-19 DIAGNOSIS — Z23 Encounter for immunization: Secondary | ICD-10-CM | POA: Insufficient documentation

## 2023-06-19 DIAGNOSIS — C7931 Secondary malignant neoplasm of brain: Secondary | ICD-10-CM | POA: Diagnosis not present

## 2023-06-19 DIAGNOSIS — Z923 Personal history of irradiation: Secondary | ICD-10-CM | POA: Insufficient documentation

## 2023-06-19 DIAGNOSIS — Z5112 Encounter for antineoplastic immunotherapy: Secondary | ICD-10-CM | POA: Insufficient documentation

## 2023-06-19 DIAGNOSIS — Z79899 Other long term (current) drug therapy: Secondary | ICD-10-CM | POA: Diagnosis not present

## 2023-06-19 LAB — CMP (CANCER CENTER ONLY)
ALT: 8 U/L (ref 0–44)
AST: 9 U/L — ABNORMAL LOW (ref 15–41)
Albumin: 4 g/dL (ref 3.5–5.0)
Alkaline Phosphatase: 85 U/L (ref 38–126)
Anion gap: 8 (ref 5–15)
BUN: 20 mg/dL (ref 8–23)
CO2: 25 mmol/L (ref 22–32)
Calcium: 9.8 mg/dL (ref 8.9–10.3)
Chloride: 105 mmol/L (ref 98–111)
Creatinine: 0.95 mg/dL (ref 0.44–1.00)
GFR, Estimated: >60 mL/min (ref >60)
Glucose, Bld: 164 mg/dL — ABNORMAL HIGH (ref 70–99)
Potassium: 4.2 mmol/L (ref 3.5–5.1)
Sodium: 138 mmol/L (ref 135–145)
Total Bilirubin: 0.5 mg/dL (ref 0.3–1.2)
Total Protein: 6.9 g/dL (ref 6.5–8.1)

## 2023-06-19 LAB — CBC WITH DIFFERENTIAL (CANCER CENTER ONLY)
Abs Immature Granulocytes: 0.07 10*3/uL (ref 0.00–0.07)
Basophils Absolute: 0 10*3/uL (ref 0.0–0.1)
Basophils Relative: 0 %
Eosinophils Absolute: 0 10*3/uL (ref 0.0–0.5)
Eosinophils Relative: 0 %
HCT: 35.3 % — ABNORMAL LOW (ref 36.0–46.0)
Hemoglobin: 11.8 g/dL — ABNORMAL LOW (ref 12.0–15.0)
Immature Granulocytes: 1 %
Lymphocytes Relative: 4 %
Lymphs Abs: 0.4 10*3/uL — ABNORMAL LOW (ref 0.7–4.0)
MCH: 33.8 pg (ref 26.0–34.0)
MCHC: 33.4 g/dL (ref 30.0–36.0)
MCV: 101.1 fL — ABNORMAL HIGH (ref 80.0–100.0)
Monocytes Absolute: 0.5 10*3/uL (ref 0.1–1.0)
Monocytes Relative: 5 %
Neutro Abs: 9.5 10*3/uL — ABNORMAL HIGH (ref 1.7–7.7)
Neutrophils Relative %: 90 %
Platelet Count: 264 10*3/uL (ref 150–400)
RBC: 3.49 MIL/uL — ABNORMAL LOW (ref 3.87–5.11)
RDW: 15 % (ref 11.5–15.5)
WBC Count: 10.5 10*3/uL (ref 4.0–10.5)
nRBC: 0 % (ref 0.0–0.2)

## 2023-06-19 MED ORDER — SODIUM CHLORIDE 0.9 % IV SOLN: Freq: Once | INTRAVENOUS | Status: AC

## 2023-06-19 MED ORDER — INFLUENZA VAC A&B SURF ANT ADJ 0.5 ML IM SUSY
0.5000 mL | PREFILLED_SYRINGE | Freq: Once | INTRAMUSCULAR | Status: AC
  Administered 2023-06-19: 0.5 mL via INTRAMUSCULAR
  Filled 2023-06-19: qty 0.5

## 2023-06-19 MED ORDER — SODIUM CHLORIDE 0.9 % IV SOLN
500.0000 mg/m2 | Freq: Once | INTRAVENOUS | Status: AC
  Administered 2023-06-19: 900 mg via INTRAVENOUS
  Filled 2023-06-19: qty 20

## 2023-06-19 MED ORDER — DEXAMETHASONE SODIUM PHOSPHATE 10 MG/ML IJ SOLN
10.0000 mg | Freq: Once | INTRAMUSCULAR | Status: AC
  Administered 2023-06-19: 10 mg via INTRAVENOUS
  Filled 2023-06-19: qty 1

## 2023-06-19 MED ORDER — SODIUM CHLORIDE 0.9% FLUSH: 10.0000 mL | INTRAVENOUS | Status: DC | PRN

## 2023-06-19 MED ORDER — HEPARIN SOD (PORK) LOCK FLUSH 100 UNIT/ML IV SOLN: 500.0000 [IU] | Freq: Once | INTRAVENOUS | Status: DC | PRN

## 2023-06-19 NOTE — Progress Notes (Signed)
[facility] Telephone:(336) [phone]   Fax:(336) [phone]  OFFICE PROGRESS NOTE  [name], MD [address] [place] [address]  DIAGNOSIS: Stage IV (T2a, N0, M1b) non-small cell lung cancer, adenocarcinoma with negative EGFR and ALK mutations diagnosed in January 2015 and presented with right upper lobe lung mass in addition to a solitary brain metastasis.  PRIOR THERAPY: 1) Status post stereotactic radiotherapy to a solitary right parietal brain lesion under the care of Dr. Mitzi Hansen on 10/16/2013. 2) Status post palliative radiotherapy to the right lung tumor under the care of Dr. Mitzi Hansen completed on 12/05/2013. 3) Systemic chemotherapy with carboplatin for AUC of 5 and Alimta 500 mg/M2 every 3 weeks. First dose Jan 06 2014. Status post 6 cycles.  CURRENT THERAPY: Systemic chemotherapy with maintenance Alimta 500 MG/M2 every 3 weeks, status post 155 cycles.  INTERVAL HISTORY: Alexis [name] 72 y.o. female returns to the clinic today for follow-up visit accompanied by her husband Alexis [name]. Discussed the use of AI scribe software for clinical note transcription with the patient, who gave verbal consent to proceed.  History of Present Illness   The patient, a 72 year old with a diagnosis of stage four non-small cell lung cancer (adenocarcinoma) diagnosed in January 2015, has been undergoing treatment with Platinum Alimta, and currently with Alimta alone. He has completed 155 cycles of treatment to date. The patient reports feeling "wonderful" with no complaints. He denies experiencing any nausea, vomiting, diarrhea, headaches, chest pain, or breathing issues. The patient also brought in his living will and power of attorney documents, expressing a clear wish not to be put on life support.       MEDICAL HISTORY: Past Medical History:  Diagnosis Date   Anxiety    Anxiety 06/20/2016   Cervical cancer (HCC)    Diabetes mellitus without complication (HCC)     patient states she has type 2   Encounter for antineoplastic chemotherapy 07/20/2015   Malignant neoplasm of right upper lobe of lung (HCC)     non small cell lung cancer adenocarcioma with brain meta    ALLERGIES:  is allergic to glimepiride and codeine.  MEDICATIONS:  Current Outpatient Medications  Medication Sig Dispense Refill   acetaminophen (TYLENOL) 500 MG tablet Take 500 mg by mouth every 6 (six) hours as needed for mild pain or headache. Reported on 11/02/2015     Ascorbic Acid (VITAMIN C GUMMIE PO) Take 1 each by mouth every morning.     aspirin EC 81 MG tablet Take 1 tablet (81 mg total) by mouth daily. 150 tablet 2   Cranberry 240 MG CAPS Take 1 capsule by mouth daily. This is in her probiotic     [employer] ACID CONTROLLER 10 MG tablet SMARTSIG:1 Tablet(s) By Mouth Every 12 Hours PRN     dexamethasone (DECADRON) 4 MG tablet TAKE 1 TABLET TWICE A DAY THE DAY BEFORE, THE DAY OF, AND THE DAY AFTER CHEMOTHERAPY. 40 tablet 2   estradiol (ESTRACE) 0.1 MG/GM vaginal cream Place 1 Applicatorful vaginally daily.     folic acid (FOLVITE) 1 MG tablet TAKE 1 TABLET BY MOUTH EVERY DAY 90 tablet 0   glimepiride (AMARYL) 2 MG tablet TAKE 1 TABLET BY MOUTH DAILY BEFORE BREAKFAST. 90 tablet 0   loratadine (CLARITIN) 10 MG tablet Take 10 mg by mouth daily.     Multiple Vitamin (MULTIVITAMIN WITH MINERALS) TABS tablet Take 1 tablet by mouth every morning.     omeprazole (PRILOSEC) 20 MG  capsule TAKE 1 CAPSULE BY MOUTH EVERY DAY 90 capsule 0   ondansetron (ZOFRAN) 8 MG tablet TAKE 1 TABLET BY MOUTH BEFORE CHEMO 30 tablet 1   phenazopyridine (PYRIDIUM) 200 MG tablet Take 1 tablet (200 mg total) by mouth every 8 (eight) hours as needed. 10 tablet 0   prochlorperazine (COMPAZINE) 10 MG tablet Take 1 tablet (10 mg total) by mouth every 6 (six) hours as needed for nausea or vomiting. 60 tablet 0   rosuvastatin (CRESTOR) 10 MG tablet Take 1 tablet (10 mg total) by mouth daily. 30 tablet 12   senna-docusate  (SENOKOT-S) 8.6-50 MG tablet Take 1 tablet by mouth daily. 30 tablet prn   temazepam (RESTORIL) 15 MG capsule Take 1 capsule (15 mg total) by mouth at bedtime as needed for sleep. 30 capsule 0   trimethoprim (TRIMPEX) 100 MG tablet Take 100 mg by mouth daily.     Vitamin D, Ergocalciferol, (DRISDOL) 1.25 MG (50000 UNIT) CAPS capsule Take 50,000 Units by mouth once a week. Vit d 3     No current facility-administered medications for this visit.    SURGICAL HISTORY:  Past Surgical History:  Procedure Laterality Date   ABDOMINAL HYSTERECTOMY     COLOSTOMY TAKEDOWN N/A 07/10/2014   Procedure: LAPAROSCOPIC LYSIS OF ADHESIONS (90 MIN) LAPAROSCOPIC ASSISTED COLOSTOMY CLOSURE, RIGID PROCTOSIGMOIDOSCOPY;  Surgeon: Avel Peace, MD;  Location: [facility];  Service: General;  Laterality: N/A;   LAPAROTOMY N/A 11/03/2013   Procedure: EXPLORATORY LAPAROTOMY, DRAINAGE OF INTRA  ABDOMINAL ABSCESSES, MOBILIZATION OF SPLENIC FLEXURE, SIGMOID COLECTOMY WITH COLOSTOMY;  Surgeon: Adolph Pollack, MD;  Location: [facility];  Service: General;  Laterality: N/A;   VIDEO BRONCHOSCOPY Bilateral 08/30/2013   Procedure: VIDEO BRONCHOSCOPY WITH FLUORO;  Surgeon: Nyoka Cowden, MD;  Location: [facility];  Service: Cardiopulmonary;  Laterality: Bilateral;    REVIEW OF SYSTEMS:  A comprehensive review of systems was negative.   PHYSICAL EXAMINATION: General appearance: alert, cooperative, and no distress Head: Normocephalic, without obvious abnormality, atraumatic Neck: no adenopathy, no JVD, supple, symmetrical, trachea midline, and thyroid not enlarged, symmetric, no tenderness/mass/nodules Lymph nodes: Cervical, supraclavicular, and axillary nodes normal. Resp: clear to auscultation bilaterally Back: symmetric, no curvature. ROM normal. No CVA tenderness. Cardio: regular rate and rhythm, S1, S2 normal, no murmur, click, rub or gallop GI: soft, non-tender; bowel sounds normal; no masses,  no organomegaly Extremities:  extremities normal, atraumatic, no cyanosis or edema   ECOG PERFORMANCE STATUS: 1 - Symptomatic but completely ambulatory   Blood pressure (!) 134/59, pulse 79, temperature 97.8 F (36.6 C), temperature source Oral, resp. rate 17, height 5\' 4"  (1.626 m), weight 164 lb 11.2 oz (74.7 kg), SpO2 100%.  LABORATORY DATA: Lab Results  Component Value Date   WBC 10.5 06/19/2023   HGB 11.8 (L) 06/19/2023   HCT 35.3 (L) 06/19/2023   MCV 101.1 (H) 06/19/2023   PLT 264 06/19/2023      Chemistry      Component Value Date/Time   NA 138 06/19/2023 1000   NA 140 11/03/2022 0844   NA 139 08/14/2017 0837   K 4.2 06/19/2023 1000   K 4.4 08/14/2017 0837   CL 105 06/19/2023 1000   CO2 25 06/19/2023 1000   CO2 24 08/14/2017 0837   BUN 20 06/19/2023 1000   BUN 33 (H) 11/03/2022 0844   BUN 13.3 08/14/2017 0837   CREATININE 0.95 06/19/2023 1000   CREATININE 0.8 08/14/2017 0837      Component Value Date/Time  CALCIUM 9.8 06/19/2023 1000   CALCIUM 9.3 08/14/2017 0837   ALKPHOS 85 06/19/2023 1000   ALKPHOS 107 08/14/2017 0837   AST 9 (L) 06/19/2023 1000   AST 12 08/14/2017 0837   ALT 8 06/19/2023 1000   ALT 12 08/14/2017 0837   BILITOT 0.5 06/19/2023 1000   BILITOT 0.37 08/14/2017 0837       RADIOGRAPHIC STUDIES: No results found.  ASSESSMENT AND PLAN:  This is a very pleasant 72 years old white female with metastatic non-small cell lung cancer, adenocarcinoma status post induction systemic chemotherapy with carboplatin and Alimta with partial response.  The patient has no actionable EGFR or ALK mutations at the time of her diagnosis. The patient is currently on maintenance treatment with single agent Alimta status post 155 cycles. She has been tolerating this treatment well with no concerning adverse effects. Stage 4 Non-Small Cell Lung Cancer (Adenocarcinoma) Stable on Maintenance Alimta treatment s/p 155, no new symptoms reported. -Continue Alimta treatment.  End of Life  Care Patient has provided a living will and power of attorney, expressing a wish not to be put on life support. -Copy living will and power of attorney to patient's medical record. -Respect patient's wishes regarding end of life care. - Follow up in 3 weeks with the next cycle of her tratment.  She was advised to call immediately if she has any concerning symptoms in the interval.      For the anemia, the patient will continue on the oral iron tablets. She was advised to call immediately if she has any other concerning symptoms in the interval.  The patient voices understanding of current disease status and treatment options and is in agreement with the current care plan. All questions were answered. The patient knows to call the clinic with any problems, questions or concerns. We can certainly see the patient much sooner if necessary.  Disclaimer: This note was dictated with voice recognition software. Similar sounding words can inadvertently be transcribed and may not be corrected upon review.

## 2023-06-19 NOTE — Patient Instructions (Signed)
[facility]  Discharge Instructions: Thank you for choosing [facility] to provide your oncology and hematology care.   If you have a lab appointment with the Cancer Center, please go directly to the Cancer Center and check in at the registration area.   Wear comfortable clothing and clothing appropriate for easy access to any Portacath or PICC line.   We strive to give you quality time with your provider. You may need to reschedule your appointment if you arrive late (15 or more minutes).  Arriving late affects you and other patients whose appointments are after yours.  Also, if you miss three or more appointments without notifying the office, you may be dismissed from the clinic at the provider's discretion.      For prescription refill requests, have your pharmacy contact our office and allow 72 hours for refills to be completed.    Today you received the following chemotherapy and/or immunotherapy agents: Alimta.       To help prevent nausea and vomiting after your treatment, we encourage you to take your nausea medication as directed.  BELOW ARE SYMPTOMS THAT SHOULD BE REPORTED IMMEDIATELY: *FEVER GREATER THAN 100.4 F (38 C) OR HIGHER *CHILLS OR SWEATING *NAUSEA AND VOMITING THAT IS NOT CONTROLLED WITH YOUR NAUSEA MEDICATION *UNUSUAL SHORTNESS OF BREATH *UNUSUAL BRUISING OR BLEEDING *URINARY PROBLEMS (pain or burning when urinating, or frequent urination) *BOWEL PROBLEMS (unusual diarrhea, constipation, pain near the anus) TENDERNESS IN MOUTH AND THROAT WITH OR WITHOUT PRESENCE OF ULCERS (sore throat, sores in mouth, or a toothache) UNUSUAL RASH, SWELLING OR PAIN  UNUSUAL VAGINAL DISCHARGE OR ITCHING   Items with * indicate a potential emergency and should be followed up as soon as possible or go to the Emergency Department if any problems should occur.  Please show the CHEMOTHERAPY ALERT CARD or IMMUNOTHERAPY ALERT CARD at  check-in to the Emergency Department and triage nurse.  Should you have questions after your visit or need to cancel or reschedule your appointment, please contact [facility]  Dept: [phone]  and follow the prompts.  Office hours are 8:00 a.m. to 4:30 p.m. Monday - Friday. Please note that voicemails left after 4:00 p.m. may not be returned until the following business day.  We are closed weekends and major holidays. You have access to a nurse at all times for urgent questions. Please call the main number to the clinic Dept: [phone] and follow the prompts.   For any non-urgent questions, you may also contact your provider using MyChart. We now offer e-Visits for anyone 18 and older to request care online for non-urgent symptoms. For details visit [email or web address].   Also download the MyChart app! Go to the app store, search "MyChart", open the app, select [facility], and log in with your MyChart username and password.   

## 2023-06-19 NOTE — Addendum Note (Signed)
Addended by: Rae Roam on: 06/19/2023 12:32 PM   Modules accepted: Orders

## 2023-06-24 DIAGNOSIS — R509 Fever, unspecified: Secondary | ICD-10-CM | POA: Diagnosis not present

## 2023-06-24 DIAGNOSIS — J069 Acute upper respiratory infection, unspecified: Secondary | ICD-10-CM | POA: Diagnosis not present

## 2023-06-24 DIAGNOSIS — R0981 Nasal congestion: Secondary | ICD-10-CM | POA: Diagnosis not present

## 2023-06-24 DIAGNOSIS — R051 Acute cough: Secondary | ICD-10-CM | POA: Diagnosis not present

## 2023-06-25 ENCOUNTER — Other Ambulatory Visit: Payer: Self-pay | Admitting: Physician Assistant

## 2023-06-25 DIAGNOSIS — C3411 Malignant neoplasm of upper lobe, right bronchus or lung: Secondary | ICD-10-CM

## 2023-06-26 ENCOUNTER — Telehealth: Payer: Self-pay | Admitting: Medical Oncology

## 2023-06-26 NOTE — Telephone Encounter (Signed)
Pt tested positive for Covid on 10/26. Next appt 11/11so should be okay to come to appt.  Schedule  message sent last week.

## 2023-06-28 ENCOUNTER — Encounter: Payer: Self-pay | Admitting: Internal Medicine

## 2023-06-29 ENCOUNTER — Other Ambulatory Visit: Payer: Self-pay

## 2023-06-30 ENCOUNTER — Telehealth: Payer: Self-pay | Admitting: *Deleted

## 2023-06-30 ENCOUNTER — Other Ambulatory Visit: Payer: Self-pay

## 2023-06-30 ENCOUNTER — Encounter: Payer: Self-pay | Admitting: Internal Medicine

## 2023-06-30 NOTE — Telephone Encounter (Signed)
TCT patient's husband. [name].  He has been made aware of his wife's appts on 07/10/23. He voiced understanding. Reviewed +covid protocol with him. He voiced understanding. He will call [phone] when they arrive at the cancer center.

## 2023-06-30 NOTE — Telephone Encounter (Signed)
Received call from pt's spouse regarding her next chemo appt. Apparently she tested + for covid on 06/24/23 and Vincent Peyer, RN had sent a scheduling message to have her appts changed to 07/10/23. Mr. Babe states these have not been scheduled yet. Melanie rodgers, RN AD has been made aware of this and will be getting these appts scheduled. Mr. Agostini voiced understanding

## 2023-07-02 ENCOUNTER — Other Ambulatory Visit: Payer: Self-pay

## 2023-07-03 ENCOUNTER — Other Ambulatory Visit: Payer: Self-pay | Admitting: Internal Medicine

## 2023-07-03 DIAGNOSIS — C3411 Malignant neoplasm of upper lobe, right bronchus or lung: Secondary | ICD-10-CM

## 2023-07-07 ENCOUNTER — Telehealth: Payer: Self-pay | Admitting: Medical Oncology

## 2023-07-07 ENCOUNTER — Encounter: Payer: Self-pay | Admitting: Internal Medicine

## 2023-07-07 NOTE — Telephone Encounter (Signed)
Confirmed appts for 11/11 and COVID protocol to call infusion.

## 2023-07-10 ENCOUNTER — Other Ambulatory Visit: Payer: Self-pay | Admitting: Physician Assistant

## 2023-07-10 ENCOUNTER — Inpatient Hospital Stay (HOSPITAL_BASED_OUTPATIENT_CLINIC_OR_DEPARTMENT_OTHER): Payer: Medicare Other | Admitting: Physician Assistant

## 2023-07-10 ENCOUNTER — Inpatient Hospital Stay: Payer: Medicare Other

## 2023-07-10 ENCOUNTER — Other Ambulatory Visit: Payer: Medicare Other

## 2023-07-10 ENCOUNTER — Encounter: Payer: Self-pay | Admitting: Internal Medicine

## 2023-07-10 ENCOUNTER — Inpatient Hospital Stay: Payer: Medicare Other | Attending: Internal Medicine

## 2023-07-10 VITALS — BP 120/71 | HR 72 | Temp 98.3°F | Resp 16 | Wt 164.2 lb

## 2023-07-10 DIAGNOSIS — Z5112 Encounter for antineoplastic immunotherapy: Secondary | ICD-10-CM | POA: Diagnosis not present

## 2023-07-10 DIAGNOSIS — C7931 Secondary malignant neoplasm of brain: Secondary | ICD-10-CM | POA: Diagnosis not present

## 2023-07-10 DIAGNOSIS — Z923 Personal history of irradiation: Secondary | ICD-10-CM | POA: Diagnosis not present

## 2023-07-10 DIAGNOSIS — Z79899 Other long term (current) drug therapy: Secondary | ICD-10-CM | POA: Diagnosis not present

## 2023-07-10 DIAGNOSIS — T451X5A Adverse effect of antineoplastic and immunosuppressive drugs, initial encounter: Secondary | ICD-10-CM

## 2023-07-10 DIAGNOSIS — C3411 Malignant neoplasm of upper lobe, right bronchus or lung: Secondary | ICD-10-CM

## 2023-07-10 DIAGNOSIS — Z9221 Personal history of antineoplastic chemotherapy: Secondary | ICD-10-CM | POA: Insufficient documentation

## 2023-07-10 DIAGNOSIS — D6481 Anemia due to antineoplastic chemotherapy: Secondary | ICD-10-CM

## 2023-07-10 LAB — CBC WITH DIFFERENTIAL (CANCER CENTER ONLY)
Abs Immature Granulocytes: 0.06 10*3/uL (ref 0.00–0.07)
Basophils Absolute: 0 10*3/uL (ref 0.0–0.1)
Basophils Relative: 0 %
Eosinophils Absolute: 0 10*3/uL (ref 0.0–0.5)
Eosinophils Relative: 0 %
HCT: 31.8 % — ABNORMAL LOW (ref 36.0–46.0)
Hemoglobin: 10.4 g/dL — ABNORMAL LOW (ref 12.0–15.0)
Immature Granulocytes: 1 %
Lymphocytes Relative: 4 %
Lymphs Abs: 0.5 10*3/uL — ABNORMAL LOW (ref 0.7–4.0)
MCH: 33.7 pg (ref 26.0–34.0)
MCHC: 32.7 g/dL (ref 30.0–36.0)
MCV: 102.9 fL — ABNORMAL HIGH (ref 80.0–100.0)
Monocytes Absolute: 0.4 10*3/uL (ref 0.1–1.0)
Monocytes Relative: 4 %
Neutro Abs: 10 10*3/uL — ABNORMAL HIGH (ref 1.7–7.7)
Neutrophils Relative %: 91 %
Platelet Count: 487 10*3/uL — ABNORMAL HIGH (ref 150–400)
RBC: 3.09 MIL/uL — ABNORMAL LOW (ref 3.87–5.11)
RDW: 15.1 % (ref 11.5–15.5)
WBC Count: 10.9 10*3/uL — ABNORMAL HIGH (ref 4.0–10.5)
nRBC: 0 % (ref 0.0–0.2)

## 2023-07-10 LAB — CMP (CANCER CENTER ONLY)
ALT: 8 U/L (ref 0–44)
AST: 10 U/L — ABNORMAL LOW (ref 15–41)
Albumin: 3.6 g/dL (ref 3.5–5.0)
Alkaline Phosphatase: 81 U/L (ref 38–126)
Anion gap: 8 (ref 5–15)
BUN: 18 mg/dL (ref 8–23)
CO2: 25 mmol/L (ref 22–32)
Calcium: 9.1 mg/dL (ref 8.9–10.3)
Chloride: 106 mmol/L (ref 98–111)
Creatinine: 0.93 mg/dL (ref 0.44–1.00)
GFR, Estimated: >60 mL/min (ref >60)
Glucose, Bld: 161 mg/dL — ABNORMAL HIGH (ref 70–99)
Potassium: 4 mmol/L (ref 3.5–5.1)
Sodium: 139 mmol/L (ref 135–145)
Total Bilirubin: 0.3 mg/dL (ref <1.2)
Total Protein: 6.9 g/dL (ref 6.5–8.1)

## 2023-07-10 MED ORDER — SODIUM CHLORIDE 0.9 % IV SOLN: Freq: Once | INTRAVENOUS | Status: AC

## 2023-07-10 MED ORDER — DEXAMETHASONE SODIUM PHOSPHATE 10 MG/ML IJ SOLN
10.0000 mg | Freq: Once | INTRAMUSCULAR | Status: AC
Start: 2023-07-10 — End: 2023-07-10
  Administered 2023-07-10: 10 mg via INTRAVENOUS
  Filled 2023-07-10: qty 1

## 2023-07-10 MED ORDER — CYANOCOBALAMIN 1000 MCG/ML IJ SOLN
1000.0000 ug | Freq: Once | INTRAMUSCULAR | Status: AC
  Administered 2023-07-10: 1000 ug via INTRAMUSCULAR
  Filled 2023-07-10: qty 1

## 2023-07-10 MED ORDER — SODIUM CHLORIDE 0.9 % IV SOLN
500.0000 mg/m2 | Freq: Once | INTRAVENOUS | Status: AC
  Administered 2023-07-10: 900 mg via INTRAVENOUS
  Filled 2023-07-10: qty 20

## 2023-07-10 NOTE — Progress Notes (Signed)
[facility] OFFICE PROGRESS NOTE  Alexis Gaul, MD [address] [place] [address]  DIAGNOSIS: Stage IV (T2a, N0, M1b) non-small cell lung cancer, adenocarcinoma with negative EGFR and ALK mutations diagnosed in January 2015 and presented with right upper lobe lung mass in addition to a solitary brain metastasis   PRIOR THERAPY: 1) Status post stereotactic radiotherapy to a solitary right parietal brain lesion under the care of Dr. Mitzi Hansen on 10/16/2013. 2) Status post palliative radiotherapy to the right lung tumor under the care of Dr. Mitzi Hansen completed on 12/05/2013. 3) Systemic chemotherapy with carboplatin for AUC of 5 and Alimta 500 mg/M2 every 3 weeks. First dose Jan 06 2014. Status post 6 cycles  CURRENT THERAPY: Systemic chemotherapy with maintenance Alimta 500 MG/M2 every 3 weeks, status post 156 cycles.   INTERVAL HISTORY: Alexis [name] 72 y.o. female returns to the clinic today for follow-up visit.  She was last seen in the clinic 3 weeks ago by Dr. Arbutus Ped.  In the interval since last being seen she tested positive for COVID on 06/24/23.  She had a significant cough which caused abdominal pain and soreness from coughing and a headache.  She states that she also was told that she had flu in addition to COVID-19.  Overall she recovered well and is feeling better at this time.  She denies any fevers, chills, or night sweats.  She denies any unexplained weight loss.  She denies any current chest pain, significant cough, hemoptysis, or shortness of breath.  She denies any nausea or vomiting.  Denies any diarrhea or constipation.  Denies any headache or visual changes at this time.  She is here today for evaluation repeat blood work before undergoing cycle 157.  MEDICAL HISTORY: Past Medical History:  Diagnosis Date   Anxiety    Anxiety 06/20/2016   Cervical cancer (HCC)    Diabetes mellitus without complication (HCC)    patient states she has type 2    Encounter for antineoplastic chemotherapy 07/20/2015   Malignant neoplasm of right upper lobe of lung (HCC)     non small cell lung cancer adenocarcioma with brain meta    ALLERGIES:  is allergic to glimepiride and codeine.  MEDICATIONS:  Current Outpatient Medications  Medication Sig Dispense Refill   acetaminophen (TYLENOL) 500 MG tablet Take 500 mg by mouth every 6 (six) hours as needed for mild pain or headache. Reported on 11/02/2015     Ascorbic Acid (VITAMIN C GUMMIE PO) Take 1 each by mouth every morning.     aspirin EC 81 MG tablet Take 1 tablet (81 mg total) by mouth daily. 150 tablet 2   Cranberry 240 MG CAPS Take 1 capsule by mouth daily. This is in her probiotic     [employer] ACID CONTROLLER 10 MG tablet SMARTSIG:1 Tablet(s) By Mouth Every 12 Hours PRN     dexamethasone (DECADRON) 4 MG tablet TAKE 1 TABLET TWICE A DAY THE DAY BEFORE, THE DAY OF, AND THE DAY AFTER CHEMOTHERAPY. 40 tablet 2   estradiol (ESTRACE) 0.1 MG/GM vaginal cream Place 1 Applicatorful vaginally daily.     folic acid (FOLVITE) 1 MG tablet TAKE 1 TABLET BY MOUTH EVERY DAY 90 tablet 0   glimepiride (AMARYL) 2 MG tablet TAKE 1 TABLET BY MOUTH DAILY BEFORE BREAKFAST. 90 tablet 0   loratadine (CLARITIN) 10 MG tablet Take 10 mg by mouth daily.     Multiple Vitamin (MULTIVITAMIN WITH MINERALS) TABS tablet Take 1 tablet by mouth  every morning.     omeprazole (PRILOSEC) 20 MG capsule TAKE 1 CAPSULE BY MOUTH EVERY DAY 90 capsule 0   ondansetron (ZOFRAN) 8 MG tablet TAKE 1 TABLET BY MOUTH BEFORE CHEMO 30 tablet 1   phenazopyridine (PYRIDIUM) 200 MG tablet Take 1 tablet (200 mg total) by mouth every 8 (eight) hours as needed. 10 tablet 0   prochlorperazine (COMPAZINE) 10 MG tablet Take 1 tablet (10 mg total) by mouth every 6 (six) hours as needed for nausea or vomiting. 60 tablet 0   rosuvastatin (CRESTOR) 10 MG tablet Take 1 tablet (10 mg total) by mouth daily. 30 tablet 12   senna-docusate (SENOKOT-S) 8.6-50 MG tablet Take 1  tablet by mouth daily. 30 tablet prn   temazepam (RESTORIL) 15 MG capsule Take 1 capsule (15 mg total) by mouth at bedtime as needed for sleep. 30 capsule 0   trimethoprim (TRIMPEX) 100 MG tablet Take 100 mg by mouth daily.     Vitamin D, Ergocalciferol, (DRISDOL) 1.25 MG (50000 UNIT) CAPS capsule Take 50,000 Units by mouth once a week. Vit d 3     No current facility-administered medications for this visit.   Facility-Administered Medications Ordered in Other Visits  Medication Dose Route Frequency Provider Last Rate Last Admin   PEMEtrexed (ALIMTA) 900 mg in sodium chloride 0.9 % 100 mL chemo infusion  500 mg/m2 (Treatment Plan Recorded) Intravenous Once Alexis Gaul, MD        SURGICAL HISTORY:  Past Surgical History:  Procedure Laterality Date   ABDOMINAL HYSTERECTOMY     COLOSTOMY TAKEDOWN N/A 07/10/2014   Procedure: LAPAROSCOPIC LYSIS OF ADHESIONS (90 MIN) LAPAROSCOPIC ASSISTED COLOSTOMY CLOSURE, RIGID PROCTOSIGMOIDOSCOPY;  Surgeon: Avel Peace, MD;  Location: [facility];  Service: General;  Laterality: N/A;   LAPAROTOMY N/A 11/03/2013   Procedure: EXPLORATORY LAPAROTOMY, DRAINAGE OF INTRA  ABDOMINAL ABSCESSES, MOBILIZATION OF SPLENIC FLEXURE, SIGMOID COLECTOMY WITH COLOSTOMY;  Surgeon: Adolph Pollack, MD;  Location: [facility];  Service: General;  Laterality: N/A;   VIDEO BRONCHOSCOPY Bilateral 08/30/2013   Procedure: VIDEO BRONCHOSCOPY WITH FLUORO;  Surgeon: Nyoka Cowden, MD;  Location: [facility];  Service: Cardiopulmonary;  Laterality: Bilateral;    REVIEW OF SYSTEMS:   Review of Systems  Constitutional: Negative for appetite change, chills, fatigue, fever and unexpected weight change.  HENT: Negative for mouth sores, nosebleeds, sore throat and trouble swallowing.   Eyes: Negative for eye problems and icterus.  Respiratory: Negative for cough (resolved), hemoptysis, shortness of breath and wheezing.   Cardiovascular: Negative for chest pain and leg swelling.   Gastrointestinal: Negative for abdominal pain, constipation, diarrhea, nausea and vomiting.  Genitourinary: Negative for bladder incontinence, difficulty urinating, dysuria, frequency and hematuria.   Musculoskeletal: Negative for back pain, gait problem, neck pain and neck stiffness.  Skin: Negative for itching and rash.  Neurological: Negative for dizziness, extremity weakness, gait problem, headaches (resolved), light-headedness and seizures.  Hematological: Negative for adenopathy. Does not bruise/bleed easily.  Psychiatric/Behavioral: Negative for confusion, depression and sleep disturbance. The patient is not nervous/anxious.     PHYSICAL EXAMINATION:  There were no vitals taken for this visit.  ECOG PERFORMANCE STATUS: 1  Physical Exam  Constitutional: Oriented to person, place, and time and well-developed, well-nourished, and in no distress.  HENT:  Head: Normocephalic and atraumatic.  Mouth/Throat: Oropharynx is clear and moist. No oropharyngeal exudate.  Eyes: Conjunctivae are normal. Right eye exhibits no discharge. Left eye exhibits no discharge. No scleral icterus.  Neck: Normal range of motion. Neck  supple.  Cardiovascular: Normal rate, regular rhythm, normal heart sounds and intact distal pulses.   Pulmonary/Chest: Effort normal and breath sounds normal. No respiratory distress. No wheezes. No rales.  Abdominal: Soft. Bowel sounds are normal. Exhibits no distension and no mass. There is no tenderness.  Musculoskeletal: Normal range of motion. Exhibits no edema.  Lymphadenopathy:    No cervical adenopathy.  Neurological: Alert and oriented to person, place, and time. Exhibits normal muscle tone. Gait normal. Coordination normal.  Skin: Skin is warm and dry. No rash noted. Not diaphoretic. No erythema. No pallor.  Psychiatric: Mood, memory and judgment normal.  Vitals reviewed.  LABORATORY DATA: Lab Results  Component Value Date   WBC 10.9 (H) 07/10/2023   HGB 10.4  (L) 07/10/2023   HCT 31.8 (L) 07/10/2023   MCV 102.9 (H) 07/10/2023   PLT 487 (H) 07/10/2023      Chemistry      Component Value Date/Time   NA 139 07/10/2023 1253   NA 140 11/03/2022 0844   NA 139 08/14/2017 0837   K 4.0 07/10/2023 1253   K 4.4 08/14/2017 0837   CL 106 07/10/2023 1253   CO2 25 07/10/2023 1253   CO2 24 08/14/2017 0837   BUN 18 07/10/2023 1253   BUN 33 (H) 11/03/2022 0844   BUN 13.3 08/14/2017 0837   CREATININE 0.93 07/10/2023 1253   CREATININE 0.8 08/14/2017 0837      Component Value Date/Time   CALCIUM 9.1 07/10/2023 1253   CALCIUM 9.3 08/14/2017 0837   ALKPHOS 81 07/10/2023 1253   ALKPHOS 107 08/14/2017 0837   AST 10 (L) 07/10/2023 1253   AST 12 08/14/2017 0837   ALT 8 07/10/2023 1253   ALT 12 08/14/2017 0837   BILITOT 0.3 07/10/2023 1253   BILITOT 0.37 08/14/2017 0837       RADIOGRAPHIC STUDIES:  No results found.   ASSESSMENT/PLAN:  This is a very pleasant 72 year old Caucasian female with stage IV non-small cell lung cancer, adenocarcinoma who presented with a right upper lobe lung mass in addition to a solitary brain metastatsis.  She was diagnosed in January 2015.    The patient had completed induction systemic chemotherapy with carboplatin and Alimta with a partial response.   The patient is currently being treated with single  agent maintenance Alimta.  She is status post 156 cycles.     Labs were reviewed. Recommend that she  proceed with #157 today as scheduled.     We will see her back for follow-up visit in 3 weeks for evaluation repeat blood work before undergoing cycle #158   The patient requested delaying her next scan by another cycle.  Therefore I will I will arrange for scan at her next appointment.  She had a small nodule that was seen on prior imaging.   She previously saw Dr. Mitzi Hansen to see if they would recommend SBRT to a small 6 mm nodule. Dr. Mitzi Hansen recommended close monitoring and would reconsider if lesion became more  progressive/solid. Recommend monitoring for now   The patient was advised to call immediately if she has any concerning symptoms in the interval. The patient voices understanding of current disease status and treatment options and is in agreement with the current care plan. All questions were answered. The patient knows to call the clinic with any problems, questions or concerns. We can certainly see the patient much sooner if necessary  No orders of the defined types were placed in this encounter.  total time spent in the appointment was 20-29 minutes.  Heyli Min L Sherrita Riederer, PA-C 07/10/23

## 2023-07-11 ENCOUNTER — Other Ambulatory Visit: Payer: Medicare Other

## 2023-07-11 ENCOUNTER — Other Ambulatory Visit: Payer: Self-pay

## 2023-07-17 ENCOUNTER — Ambulatory Visit: Payer: Medicare Other | Admitting: Radiation Oncology

## 2023-07-22 NOTE — Progress Notes (Signed)
[facility] OFFICE PROGRESS NOTE  [name], MD [address] [place] [address]  DIAGNOSIS: Stage IV (T2a, N0, M1b) non-small cell lung cancer, adenocarcinoma with negative EGFR and ALK mutations diagnosed in January 2015 and presented with right upper lobe lung mass in addition to a solitary brain metastasis   PRIOR THERAPY: 1) Status post stereotactic radiotherapy to a solitary right parietal brain lesion under the care of Dr. Mitzi Hansen on 10/16/2013. 2) Status post palliative radiotherapy to the right lung tumor under the care of Dr. Mitzi Hansen completed on 12/05/2013. 3) Systemic chemotherapy with carboplatin for AUC of 5 and Alimta 500 mg/M2 every 3 weeks. First dose Jan 06 2014. Status post 6 cycles  CURRENT THERAPY: Systemic chemotherapy with maintenance Alimta 500 MG/M2 every 3 weeks, status post 157 cycles   INTERVAL HISTORY: Alexis [name] 72 y.o. female returns to the clinic today for a follow-up visit. She was last seen by myself 3 weeks ago. She is undergoing chemotherapy with Alimta and tolerates it well.She has been on alimta several years. She denies any fevers, chills, or night sweats.  She denies any unexplained weight loss.  She denies any current chest pain, significant cough, hemoptysis, or shortness of breath.  She denies any nausea or vomiting.  Denies any diarrhea. She may have "a little bit" of constipation.  Denies any headache or visual changes at this time.  She had a repeat brain Mri last week and is scheduled to talk to radiation oncology today to review the results. She is here today for evaluation and repeat blood work before undergoing cycle 157.    MEDICAL HISTORY: Past Medical History:  Diagnosis Date   Anxiety    Anxiety 06/20/2016   Cervical cancer (HCC)    Diabetes mellitus without complication (HCC)    patient states she has type 2   Encounter for antineoplastic chemotherapy 07/20/2015   Malignant neoplasm of right upper  lobe of lung (HCC)     non small cell lung cancer adenocarcioma with brain meta    ALLERGIES:  is allergic to glimepiride and codeine.  MEDICATIONS:  Current Outpatient Medications  Medication Sig Dispense Refill   acetaminophen (TYLENOL) 500 MG tablet Take 500 mg by mouth every 6 (six) hours as needed for mild pain or headache. Reported on 11/02/2015     Ascorbic Acid (VITAMIN C GUMMIE PO) Take 1 each by mouth every morning.     aspirin EC 81 MG tablet Take 1 tablet (81 mg total) by mouth daily. 150 tablet 2   Cranberry 240 MG CAPS Take 1 capsule by mouth daily. This is in her probiotic     [employer] ACID CONTROLLER 10 MG tablet SMARTSIG:1 Tablet(s) By Mouth Every 12 Hours PRN     dexamethasone (DECADRON) 4 MG tablet TAKE 1 TABLET TWICE A DAY THE DAY BEFORE, THE DAY OF, AND THE DAY AFTER CHEMOTHERAPY. 40 tablet 2   estradiol (ESTRACE) 0.1 MG/GM vaginal cream Place 1 Applicatorful vaginally daily.     folic acid (FOLVITE) 1 MG tablet TAKE 1 TABLET BY MOUTH EVERY DAY 90 tablet 0   glimepiride (AMARYL) 2 MG tablet TAKE 1 TABLET BY MOUTH DAILY BEFORE BREAKFAST. 90 tablet 0   loratadine (CLARITIN) 10 MG tablet Take 10 mg by mouth daily.     Multiple Vitamin (MULTIVITAMIN WITH MINERALS) TABS tablet Take 1 tablet by mouth every morning.     omeprazole (PRILOSEC) 20 MG capsule TAKE 1 CAPSULE BY MOUTH EVERY DAY 90  capsule 0   ondansetron (ZOFRAN) 8 MG tablet TAKE 1 TABLET BY MOUTH BEFORE CHEMO 30 tablet 1   phenazopyridine (PYRIDIUM) 200 MG tablet Take 1 tablet (200 mg total) by mouth every 8 (eight) hours as needed. 10 tablet 0   prochlorperazine (COMPAZINE) 10 MG tablet Take 1 tablet (10 mg total) by mouth every 6 (six) hours as needed for nausea or vomiting. 60 tablet 0   rosuvastatin (CRESTOR) 10 MG tablet Take 1 tablet (10 mg total) by mouth daily. 30 tablet 12   senna-docusate (SENOKOT-S) 8.6-50 MG tablet Take 1 tablet by mouth daily. 30 tablet prn   temazepam (RESTORIL) 15 MG capsule Take 1 capsule  (15 mg total) by mouth at bedtime as needed for sleep. 30 capsule 0   trimethoprim (TRIMPEX) 100 MG tablet Take 100 mg by mouth daily.     Vitamin D, Ergocalciferol, (DRISDOL) 1.25 MG (50000 UNIT) CAPS capsule Take 50,000 Units by mouth once a week. Vit d 3     No current facility-administered medications for this visit.    SURGICAL HISTORY:  Past Surgical History:  Procedure Laterality Date   ABDOMINAL HYSTERECTOMY     COLOSTOMY TAKEDOWN N/A 07/10/2014   Procedure: LAPAROSCOPIC LYSIS OF ADHESIONS (90 MIN) LAPAROSCOPIC ASSISTED COLOSTOMY CLOSURE, RIGID PROCTOSIGMOIDOSCOPY;  Surgeon: Avel Peace, MD;  Location: [facility];  Service: General;  Laterality: N/A;   LAPAROTOMY N/A 11/03/2013   Procedure: EXPLORATORY LAPAROTOMY, DRAINAGE OF INTRA  ABDOMINAL ABSCESSES, MOBILIZATION OF SPLENIC FLEXURE, SIGMOID COLECTOMY WITH COLOSTOMY;  Surgeon: Adolph Pollack, MD;  Location: [facility];  Service: General;  Laterality: N/A;   VIDEO BRONCHOSCOPY Bilateral 08/30/2013   Procedure: VIDEO BRONCHOSCOPY WITH FLUORO;  Surgeon: Nyoka Cowden, MD;  Location: [facility];  Service: Cardiopulmonary;  Laterality: Bilateral;    REVIEW OF SYSTEMS:   Constitutional: Negative for appetite change, chills, fatigue, fever and unexpected weight change.  HENT: Negative for mouth sores, nosebleeds, sore throat and trouble swallowing.   Eyes: Negative for eye problems and icterus.  Respiratory: Negative for cough, hemoptysis, shortness of breath and wheezing.   Cardiovascular: Negative for chest pain and leg swelling.  Gastrointestinal: Negative for abdominal pain, constipation (mild and not at this time), diarrhea, nausea and vomiting.  Genitourinary: Negative for bladder incontinence, difficulty urinating, dysuria, frequency and hematuria.   Musculoskeletal: Negative for back pain, gait problem, neck pain and neck stiffness.  Skin: Negative for itching and rash.  Neurological: Negative for dizziness, extremity weakness,  gait problem, headaches (resolved), light-headedness and seizures.  Hematological: Negative for adenopathy. Does not bruise/bleed easily.  Psychiatric/Behavioral: Negative for confusion, depression and sleep disturbance. The patient is not nervous/anxious.     PHYSICAL EXAMINATION:  There were no vitals taken for this visit.  ECOG PERFORMANCE STATUS: 1  Physical Exam  Constitutional: Oriented to person, place, and time and well-developed, well-nourished, and in no distress.  HENT:  Head: Normocephalic and atraumatic.  Mouth/Throat: Oropharynx is clear and moist. No oropharyngeal exudate.  Eyes: Conjunctivae are normal. Right eye exhibits no discharge. Left eye exhibits no discharge. No scleral icterus.  Neck: Normal range of motion. Neck supple.  Cardiovascular: Normal rate, regular rhythm, normal heart sounds and intact distal pulses.   Pulmonary/Chest: Effort normal and breath sounds normal. No respiratory distress. No wheezes. No rales.  Abdominal: Soft. Bowel sounds are normal. Exhibits no distension and no mass. There is no tenderness.  Musculoskeletal: Normal range of motion. Exhibits no edema.  Lymphadenopathy:    No cervical adenopathy.  Neurological:  Alert and oriented to person, place, and time. Exhibits normal muscle tone. Gait normal. Coordination normal.  Skin: Skin is warm and dry. No rash noted. Not diaphoretic. No erythema. No pallor. Mild bruising on upper extremities.  Psychiatric: Mood, memory and judgment normal.  Vitals reviewed.  LABORATORY DATA: Lab Results  Component Value Date   WBC 10.9 (H) 07/10/2023   HGB 10.4 (L) 07/10/2023   HCT 31.8 (L) 07/10/2023   MCV 102.9 (H) 07/10/2023   PLT 487 (H) 07/10/2023      Chemistry      Component Value Date/Time   NA 139 07/10/2023 1253   NA 140 11/03/2022 0844   NA 139 08/14/2017 0837   K 4.0 07/10/2023 1253   K 4.4 08/14/2017 0837   CL 106 07/10/2023 1253   CO2 25 07/10/2023 1253   CO2 24 08/14/2017 0837    BUN 18 07/10/2023 1253   BUN 33 (H) 11/03/2022 0844   BUN 13.3 08/14/2017 0837   CREATININE 0.93 07/10/2023 1253   CREATININE 0.8 08/14/2017 0837      Component Value Date/Time   CALCIUM 9.1 07/10/2023 1253   CALCIUM 9.3 08/14/2017 0837   ALKPHOS 81 07/10/2023 1253   ALKPHOS 107 08/14/2017 0837   AST 10 (L) 07/10/2023 1253   AST 12 08/14/2017 0837   ALT 8 07/10/2023 1253   ALT 12 08/14/2017 0837   BILITOT 0.3 07/10/2023 1253   BILITOT 0.37 08/14/2017 0837       RADIOGRAPHIC STUDIES:  No results found.   ASSESSMENT/PLAN:  This is a very pleasant 72 year old Caucasian female with stage IV non-small cell lung cancer, adenocarcinoma who presented with a right upper lobe lung mass in addition to a solitary brain metastatsis.  She was diagnosed in January 2015.    The patient had completed induction systemic chemotherapy with carboplatin and Alimta with a partial response.   The patient is currently being treated with single  agent maintenance Alimta.  She is status post 157 cycles.     Labs were reviewed. Recommend that she proceed with #158 today as scheduled.     We will see her back for follow-up visit in 3 weeks for evaluation repeat blood work before undergoing cycle #159   I will order a restaging CT of the CAP prior to her next appointment.    She had a small nodule that was seen on prior imaging.  She previously saw Dr. Mitzi Hansen to see if they would recommend SBRT to a small 6 mm nodule. Dr. Mitzi Hansen recommended close monitoring and would reconsider if lesion became more progressive/solid. Recommend monitoring for now   She had a repeat brain MRI recently. The results are not available at this time. She is scheduled to talk to radiation oncology with the results later today.   The patient was advised to call immediately if she has any concerning symptoms in the interval. The patient voices understanding of current disease status and treatment options and is in agreement  with the current care plan. All questions were answered. The patient knows to call the clinic with any problems, questions or concerns. We can certainly see the patient much sooner if necessary     No orders of the defined types were placed in this encounter.     The total time spent in the appointment was 20-29 minutes  Franklin Clapsaddle L Arno Cullers, PA-C 07/22/23

## 2023-07-24 ENCOUNTER — Encounter: Payer: Self-pay | Admitting: Internal Medicine

## 2023-07-25 ENCOUNTER — Encounter: Payer: Self-pay | Admitting: Internal Medicine

## 2023-07-25 ENCOUNTER — Ambulatory Visit
Admission: RE | Admit: 2023-07-25 | Discharge: 2023-07-25 | Disposition: A | Discharge status: Home / Self Care | Payer: Medicare Other | Source: Ambulatory Visit | Attending: Radiation Oncology | Admitting: Radiation Oncology

## 2023-07-25 DIAGNOSIS — Z85118 Personal history of other malignant neoplasm of bronchus and lung: Secondary | ICD-10-CM | POA: Diagnosis not present

## 2023-07-25 DIAGNOSIS — C7931 Secondary malignant neoplasm of brain: Secondary | ICD-10-CM

## 2023-07-25 MED ORDER — GADOPICLENOL 0.5 MMOL/ML IV SOLN
7.5000 mL | Freq: Once | INTRAVENOUS | Status: AC | PRN
  Administered 2023-07-25: 7.5 mL via INTRAVENOUS

## 2023-07-31 ENCOUNTER — Inpatient Hospital Stay (HOSPITAL_BASED_OUTPATIENT_CLINIC_OR_DEPARTMENT_OTHER): Payer: Medicare Other | Attending: Internal Medicine | Admitting: Physician Assistant

## 2023-07-31 ENCOUNTER — Ambulatory Visit
Admission: RE | Admit: 2023-07-31 | Discharge: 2023-07-31 | Disposition: A | Discharge status: Home / Self Care | Payer: Medicare Other | Source: Ambulatory Visit | Attending: Radiation Oncology | Admitting: Radiation Oncology

## 2023-07-31 ENCOUNTER — Encounter: Payer: Self-pay | Admitting: Radiation Oncology

## 2023-07-31 ENCOUNTER — Inpatient Hospital Stay: Payer: Medicare Other

## 2023-07-31 ENCOUNTER — Inpatient Hospital Stay: Payer: Medicare Other | Attending: Internal Medicine

## 2023-07-31 VITALS — BP 130/62 | HR 79 | Temp 97.2°F | Resp 17 | Ht 64.0 in | Wt 165.0 lb

## 2023-07-31 DIAGNOSIS — G4700 Insomnia, unspecified: Secondary | ICD-10-CM | POA: Diagnosis not present

## 2023-07-31 DIAGNOSIS — C7931 Secondary malignant neoplasm of brain: Secondary | ICD-10-CM | POA: Diagnosis not present

## 2023-07-31 DIAGNOSIS — K5900 Constipation, unspecified: Secondary | ICD-10-CM | POA: Insufficient documentation

## 2023-07-31 DIAGNOSIS — Z79899 Other long term (current) drug therapy: Secondary | ICD-10-CM | POA: Insufficient documentation

## 2023-07-31 DIAGNOSIS — Z5112 Encounter for antineoplastic immunotherapy: Secondary | ICD-10-CM | POA: Insufficient documentation

## 2023-07-31 DIAGNOSIS — Z5111 Encounter for antineoplastic chemotherapy: Secondary | ICD-10-CM | POA: Diagnosis not present

## 2023-07-31 DIAGNOSIS — C3411 Malignant neoplasm of upper lobe, right bronchus or lung: Secondary | ICD-10-CM

## 2023-07-31 LAB — CMP (CANCER CENTER ONLY)
ALT: 10 U/L (ref 0–44)
AST: 10 U/L — ABNORMAL LOW (ref 15–41)
Albumin: 4 g/dL (ref 3.5–5.0)
Alkaline Phosphatase: 90 U/L (ref 38–126)
Anion gap: 9 (ref 5–15)
BUN: 18 mg/dL (ref 8–23)
CO2: 25 mmol/L (ref 22–32)
Calcium: 9.7 mg/dL (ref 8.9–10.3)
Chloride: 104 mmol/L (ref 98–111)
Creatinine: 0.84 mg/dL (ref 0.44–1.00)
GFR, Estimated: >60 mL/min (ref >60)
Glucose, Bld: 164 mg/dL — ABNORMAL HIGH (ref 70–99)
Potassium: 4.1 mmol/L (ref 3.5–5.1)
Sodium: 138 mmol/L (ref 135–145)
Total Bilirubin: 0.4 mg/dL (ref <1.2)
Total Protein: 6.9 g/dL (ref 6.5–8.1)

## 2023-07-31 LAB — CBC WITH DIFFERENTIAL (CANCER CENTER ONLY)
Abs Immature Granulocytes: 0.07 10*3/uL (ref 0.00–0.07)
Basophils Absolute: 0 10*3/uL (ref 0.0–0.1)
Basophils Relative: 0 %
Eosinophils Absolute: 0 10*3/uL (ref 0.0–0.5)
Eosinophils Relative: 0 %
HCT: 36.6 % (ref 36.0–46.0)
Hemoglobin: 11.7 g/dL — ABNORMAL LOW (ref 12.0–15.0)
Immature Granulocytes: 1 %
Lymphocytes Relative: 5 %
Lymphs Abs: 0.6 10*3/uL — ABNORMAL LOW (ref 0.7–4.0)
MCH: 33.3 pg (ref 26.0–34.0)
MCHC: 32 g/dL (ref 30.0–36.0)
MCV: 104.3 fL — ABNORMAL HIGH (ref 80.0–100.0)
Monocytes Absolute: 0.6 10*3/uL (ref 0.1–1.0)
Monocytes Relative: 5 %
Neutro Abs: 10.9 10*3/uL — ABNORMAL HIGH (ref 1.7–7.7)
Neutrophils Relative %: 89 %
Platelet Count: 302 10*3/uL (ref 150–400)
RBC: 3.51 MIL/uL — ABNORMAL LOW (ref 3.87–5.11)
RDW: 15.2 % (ref 11.5–15.5)
WBC Count: 12.2 10*3/uL — ABNORMAL HIGH (ref 4.0–10.5)
nRBC: 0 % (ref 0.0–0.2)

## 2023-07-31 MED ORDER — SODIUM CHLORIDE 0.9 % IV SOLN: Freq: Once | INTRAVENOUS | Status: AC

## 2023-07-31 MED ORDER — DEXAMETHASONE SODIUM PHOSPHATE 10 MG/ML IJ SOLN
10.0000 mg | Freq: Once | INTRAMUSCULAR | Status: AC
Start: 2023-07-31 — End: 2023-07-31
  Administered 2023-07-31: 10 mg via INTRAVENOUS
  Filled 2023-07-31: qty 1

## 2023-07-31 MED ORDER — SODIUM CHLORIDE 0.9 % IV SOLN
500.0000 mg/m2 | Freq: Once | INTRAVENOUS | Status: AC
  Administered 2023-07-31: 900 mg via INTRAVENOUS
  Filled 2023-07-31: qty 20

## 2023-07-31 NOTE — Progress Notes (Addendum)
Telephone nursing appointment for review of most recent MRI of Brain. I verified patient's identity x2 and began nursing interview.   Patient reports doing well. Patient denies any related issues at this time.   Meaningful use complete.   Patient aware of their 1pm 07/31/2023 telephone appointment w/ Laurence Aly PA-C. I left my extension [phone] in case patient needs anything. Patient verbalized understanding. This concludes the nursing interview.   Patient contact [phone]     [name], LPN

## 2023-07-31 NOTE — Patient Instructions (Signed)
 [facility] - A DEPT OF [facility]  Discharge Instructions: Thank you for choosing [facility] to provide your oncology and hematology care.   If you have a lab appointment with the Cancer Center, please go directly to the Cancer Center and check in at the registration area.   Wear comfortable clothing and clothing appropriate for easy access to any Portacath or PICC line.   We strive to give you quality time with your provider. You may need to reschedule your appointment if you arrive late (15 or more minutes).  Arriving late affects you and other patients whose appointments are after yours.  Also, if you miss three or more appointments without notifying the office, you may be dismissed from the clinic at the provider's discretion.      For prescription refill requests, have your pharmacy contact our office and allow 72 hours for refills to be completed.    Today you received the following chemotherapy and/or immunotherapy agents: Alimta      To help prevent nausea and vomiting after your treatment, we encourage you to take your nausea medication as directed.  BELOW ARE SYMPTOMS THAT SHOULD BE REPORTED IMMEDIATELY: *FEVER GREATER THAN 100.4 F (38 C) OR HIGHER *CHILLS OR SWEATING *NAUSEA AND VOMITING THAT IS NOT CONTROLLED WITH YOUR NAUSEA MEDICATION *UNUSUAL SHORTNESS OF BREATH *UNUSUAL BRUISING OR BLEEDING *URINARY PROBLEMS (pain or burning when urinating, or frequent urination) *BOWEL PROBLEMS (unusual diarrhea, constipation, pain near the anus) TENDERNESS IN MOUTH AND THROAT WITH OR WITHOUT PRESENCE OF ULCERS (sore throat, sores in mouth, or a toothache) UNUSUAL RASH, SWELLING OR PAIN  UNUSUAL VAGINAL DISCHARGE OR ITCHING   Items with * indicate a potential emergency and should be followed up as soon as possible or go to the Emergency Department if any problems should occur.  Please show the CHEMOTHERAPY ALERT CARD or IMMUNOTHERAPY  ALERT CARD at check-in to the Emergency Department and triage nurse.  Should you have questions after your visit or need to cancel or reschedule your appointment, please contact [facility] - A DEPT OF [facility]  Dept: [phone]  and follow the prompts.  Office hours are 8:00 a.m. to 4:30 p.m. Monday - Friday. Please note that voicemails left after 4:00 p.m. may not be returned until the following business day.  We are closed weekends and major holidays. You have access to a nurse at all times for urgent questions. Please call the main number to the clinic Dept: [phone] and follow the prompts.   For any non-urgent questions, you may also contact your provider using MyChart. We now offer e-Visits for anyone 49 and older to request care online for non-urgent symptoms. For details visit [email or web address].   Also download the MyChart app! Go to the app store, search "MyChart", open the app, select , and log in with your MyChart username and password.

## 2023-08-03 ENCOUNTER — Telehealth: Payer: Self-pay | Admitting: Radiation Oncology

## 2023-08-03 NOTE — Telephone Encounter (Signed)
I called and let the patient know her MRI results. We will plan for repeat in 6 months. She did have covid recently and her scan did show some sinus inflammation and mastoid effusion so I encouraged her to try OTC sudafed for 2 weeks to see if that helped clear up some of her congestion.

## 2023-08-04 ENCOUNTER — Other Ambulatory Visit: Payer: Self-pay

## 2023-08-07 ENCOUNTER — Encounter: Payer: Self-pay | Admitting: Internal Medicine

## 2023-08-09 ENCOUNTER — Other Ambulatory Visit: Payer: Self-pay | Admitting: Radiation Therapy

## 2023-08-09 DIAGNOSIS — C7931 Secondary malignant neoplasm of brain: Secondary | ICD-10-CM

## 2023-08-10 ENCOUNTER — Ambulatory Visit: Payer: Medicare Other | Admitting: Physician Assistant

## 2023-08-10 ENCOUNTER — Other Ambulatory Visit: Payer: Medicare Other

## 2023-08-10 ENCOUNTER — Other Ambulatory Visit: Payer: Self-pay

## 2023-08-12 ENCOUNTER — Other Ambulatory Visit: Payer: Self-pay

## 2023-08-14 ENCOUNTER — Ambulatory Visit (HOSPITAL_COMMUNITY)
Admission: RE | Admit: 2023-08-14 | Discharge: 2023-08-14 | Disposition: A | Discharge status: Home / Self Care | Payer: Medicare Other | Source: Ambulatory Visit | Attending: Physician Assistant | Admitting: Physician Assistant

## 2023-08-14 ENCOUNTER — Encounter (HOSPITAL_COMMUNITY): Payer: Self-pay

## 2023-08-14 ENCOUNTER — Other Ambulatory Visit: Payer: Self-pay | Admitting: Internal Medicine

## 2023-08-14 DIAGNOSIS — C3490 Malignant neoplasm of unspecified part of unspecified bronchus or lung: Secondary | ICD-10-CM | POA: Diagnosis not present

## 2023-08-14 DIAGNOSIS — C3411 Malignant neoplasm of upper lobe, right bronchus or lung: Secondary | ICD-10-CM | POA: Diagnosis not present

## 2023-08-14 DIAGNOSIS — I700 Atherosclerosis of aorta: Secondary | ICD-10-CM | POA: Diagnosis not present

## 2023-08-14 DIAGNOSIS — K760 Fatty (change of) liver, not elsewhere classified: Secondary | ICD-10-CM | POA: Diagnosis not present

## 2023-08-14 MED ORDER — IOHEXOL 300 MG/ML  SOLN
100.0000 mL | Freq: Once | INTRAMUSCULAR | Status: AC | PRN
  Administered 2023-08-14: 100 mL via INTRAVENOUS

## 2023-08-18 ENCOUNTER — Encounter: Payer: Self-pay | Admitting: Internal Medicine

## 2023-08-21 ENCOUNTER — Inpatient Hospital Stay (HOSPITAL_BASED_OUTPATIENT_CLINIC_OR_DEPARTMENT_OTHER): Payer: Medicare Other | Admitting: Internal Medicine

## 2023-08-21 ENCOUNTER — Inpatient Hospital Stay: Payer: Medicare Other

## 2023-08-21 VITALS — BP 120/54 | HR 87 | Temp 97.6°F | Resp 20

## 2023-08-21 VITALS — BP 131/79 | HR 83 | Temp 97.8°F | Resp 17 | Ht 64.0 in | Wt 163.0 lb

## 2023-08-21 DIAGNOSIS — K5900 Constipation, unspecified: Secondary | ICD-10-CM | POA: Diagnosis not present

## 2023-08-21 DIAGNOSIS — C3411 Malignant neoplasm of upper lobe, right bronchus or lung: Secondary | ICD-10-CM

## 2023-08-21 DIAGNOSIS — C7931 Secondary malignant neoplasm of brain: Secondary | ICD-10-CM | POA: Diagnosis not present

## 2023-08-21 DIAGNOSIS — Z5112 Encounter for antineoplastic immunotherapy: Secondary | ICD-10-CM | POA: Diagnosis not present

## 2023-08-21 DIAGNOSIS — G4700 Insomnia, unspecified: Secondary | ICD-10-CM | POA: Diagnosis not present

## 2023-08-21 DIAGNOSIS — Z79899 Other long term (current) drug therapy: Secondary | ICD-10-CM | POA: Diagnosis not present

## 2023-08-21 LAB — CMP (CANCER CENTER ONLY)
ALT: 10 U/L (ref 0–44)
AST: 11 U/L — ABNORMAL LOW (ref 15–41)
Albumin: 4 g/dL (ref 3.5–5.0)
Alkaline Phosphatase: 82 U/L (ref 38–126)
Anion gap: 9 (ref 5–15)
BUN: 19 mg/dL (ref 8–23)
CO2: 25 mmol/L (ref 22–32)
Calcium: 9.6 mg/dL (ref 8.9–10.3)
Chloride: 104 mmol/L (ref 98–111)
Creatinine: 0.97 mg/dL (ref 0.44–1.00)
GFR, Estimated: >60 mL/min (ref >60)
Glucose, Bld: 150 mg/dL — ABNORMAL HIGH (ref 70–99)
Potassium: 4 mmol/L (ref 3.5–5.1)
Sodium: 138 mmol/L (ref 135–145)
Total Bilirubin: 0.4 mg/dL (ref <1.2)
Total Protein: 6.7 g/dL (ref 6.5–8.1)

## 2023-08-21 LAB — CBC WITH DIFFERENTIAL (CANCER CENTER ONLY)
Abs Immature Granulocytes: 0.05 10*3/uL (ref 0.00–0.07)
Basophils Absolute: 0 10*3/uL (ref 0.0–0.1)
Basophils Relative: 0 %
Eosinophils Absolute: 0.1 10*3/uL (ref 0.0–0.5)
Eosinophils Relative: 1 %
HCT: 35.8 % — ABNORMAL LOW (ref 36.0–46.0)
Hemoglobin: 11.5 g/dL — ABNORMAL LOW (ref 12.0–15.0)
Immature Granulocytes: 1 %
Lymphocytes Relative: 5 %
Lymphs Abs: 0.5 10*3/uL — ABNORMAL LOW (ref 0.7–4.0)
MCH: 32.6 pg (ref 26.0–34.0)
MCHC: 32.1 g/dL (ref 30.0–36.0)
MCV: 101.4 fL — ABNORMAL HIGH (ref 80.0–100.0)
Monocytes Absolute: 0.4 10*3/uL (ref 0.1–1.0)
Monocytes Relative: 4 %
Neutro Abs: 9.4 10*3/uL — ABNORMAL HIGH (ref 1.7–7.7)
Neutrophils Relative %: 89 %
Platelet Count: 313 10*3/uL (ref 150–400)
RBC: 3.53 MIL/uL — ABNORMAL LOW (ref 3.87–5.11)
RDW: 14.9 % (ref 11.5–15.5)
WBC Count: 10.4 10*3/uL (ref 4.0–10.5)
nRBC: 0 % (ref 0.0–0.2)

## 2023-08-21 MED ORDER — SODIUM CHLORIDE 0.9 % IV SOLN: Freq: Once | INTRAVENOUS | Status: AC

## 2023-08-21 MED ORDER — PEMETREXED DISODIUM CHEMO INJECTION 500 MG
500.0000 mg/m2 | Freq: Once | INTRAVENOUS | Status: AC
  Administered 2023-08-21: 900 mg via INTRAVENOUS
  Filled 2023-08-21: qty 20

## 2023-08-21 MED ORDER — DEXAMETHASONE SODIUM PHOSPHATE 10 MG/ML IJ SOLN
10.0000 mg | Freq: Once | INTRAMUSCULAR | Status: AC
  Administered 2023-08-21: 10 mg via INTRAVENOUS
  Filled 2023-08-21: qty 1

## 2023-08-21 NOTE — Patient Instructions (Signed)
[facility] - A DEPT OF [facility]  Discharge Instructions: Thank you for choosing [facility] to provide your oncology and hematology care.   If you have a lab appointment with the Cancer Center, please go directly to the Cancer Center and check in at the registration area.   Wear comfortable clothing and clothing appropriate for easy access to any Portacath or PICC line.   We strive to give you quality time with your provider. You may need to reschedule your appointment if you arrive late (15 or more minutes).  Arriving late affects you and other patients whose appointments are after yours.  Also, if you miss three or more appointments without notifying the office, you may be dismissed from the clinic at the provider's discretion.      For prescription refill requests, have your pharmacy contact our office and allow 72 hours for refills to be completed.    Today you received the following chemotherapy and/or immunotherapy agent: Pemtrexed (Alimta)      To help prevent nausea and vomiting after your treatment, we encourage you to take your nausea medication as directed.  BELOW ARE SYMPTOMS THAT SHOULD BE REPORTED IMMEDIATELY: *FEVER GREATER THAN 100.4 F (38 C) OR HIGHER *CHILLS OR SWEATING *NAUSEA AND VOMITING THAT IS NOT CONTROLLED WITH YOUR NAUSEA MEDICATION *UNUSUAL SHORTNESS OF BREATH *UNUSUAL BRUISING OR BLEEDING *URINARY PROBLEMS (pain or burning when urinating, or frequent urination) *BOWEL PROBLEMS (unusual diarrhea, constipation, pain near the anus) TENDERNESS IN MOUTH AND THROAT WITH OR WITHOUT PRESENCE OF ULCERS (sore throat, sores in mouth, or a toothache) UNUSUAL RASH, SWELLING OR PAIN  UNUSUAL VAGINAL DISCHARGE OR ITCHING   Items with * indicate a potential emergency and should be followed up as soon as possible or go to the Emergency Department if any problems should occur.  Please show the CHEMOTHERAPY ALERT CARD or  IMMUNOTHERAPY ALERT CARD at check-in to the Emergency Department and triage nurse.  Should you have questions after your visit or need to cancel or reschedule your appointment, please contact [facility] - A DEPT OF [facility]  Dept: [phone]  and follow the prompts.  Office hours are 8:00 a.m. to 4:30 p.m. Monday - Friday. Please note that voicemails left after 4:00 p.m. may not be returned until the following business day.  We are closed weekends and major holidays. You have access to a nurse at all times for urgent questions. Please call the main number to the clinic Dept: [phone] and follow the prompts.   For any non-urgent questions, you may also contact your provider using MyChart. We now offer e-Visits for anyone 51 and older to request care online for non-urgent symptoms. For details visit [email or web address].   Also download the MyChart app! Go to the app store, search "MyChart", open the app, select [facility], and log in with your MyChart username and password. Pemetrexed Injection What is this medication? PEMETREXED (PEM e TREX ed) treats some types of cancer. It works by slowing down the growth of cancer cells. This medicine may be used for other purposes; ask your health care provider or pharmacist if you have questions. COMMON BRAND NAME(S): Alimta, PEMFEXY, PEMRYDI RTU What should I tell my care team before I take this medication? They need to know if you have any of these conditions: Infection, such as chickenpox, cold sores, or herpes Kidney disease Low blood cell levels (white cells, red  cells, and platelets) Lung or breathing disease, such as asthma Radiation therapy An unusual or allergic reaction to pemetrexed, other medications, foods, dyes, or preservatives If you or your partner are pregnant or trying to get pregnant Breast-feeding How should I use this medication? This medication is injected into a vein. It is given by  your care team in a hospital or clinic setting. Talk to your care team about the use of this medication in children. Special care may be needed. Overdosage: If you think you have taken too much of this medicine contact a poison control center or emergency room at once. NOTE: This medicine is only for you. Do not share this medicine with others. What if I miss a dose? Keep appointments for follow-up doses. It is important not to miss your dose. Call your care team if you are unable to keep an appointment. What may interact with this medication? Do not take this medication with any of the following: Live virus vaccines This medication may also interact with the following: Ibuprofen This list may not describe all possible interactions. Give your health care provider a list of all the medicines, herbs, non-prescription drugs, or dietary supplements you use. Also tell them if you smoke, drink alcohol, or use illegal drugs. Some items may interact with your medicine. What should I watch for while using this medication? Your condition will be monitored carefully while you are receiving this medication. This medication may make you feel generally unwell. This is not uncommon as chemotherapy can affect healthy cells as well as cancer cells. Report any side effects. Continue your course of treatment even though you feel ill unless your care team tells you to stop. This medication can cause serious side effects. To reduce the risk, your care team may give you other medications to take before receiving this one. Be sure to follow the directions from your care team. This medication can cause a rash or redness in areas of the body that have previously had radiation therapy. If you have had radiation therapy, tell your care team if you notice a rash in this area. This medication may increase your risk of getting an infection. Call your care team for advice if you get a fever, chills, sore throat, or other symptoms  of a cold or flu. Do not treat yourself. Try to avoid being around people who are sick. Be careful brushing or flossing your teeth or using a toothpick because you may get an infection or bleed more easily. If you have any dental work done, tell your dentist you are receiving this medication. Avoid taking medications that contain aspirin, acetaminophen, ibuprofen, naproxen, or ketoprofen unless instructed by your care team. These medications may hide a fever. Check with your care team if you have severe diarrhea, nausea, and vomiting, or if you sweat a lot. The loss of too much body fluid may make it dangerous for you to take this medication. Talk to your care team if you or your partner wish to become pregnant or think either of you might be pregnant. This medication can cause serious birth defects if taken during pregnancy and for 6 months after the last dose. A negative pregnancy test is required before starting this medication. A reliable form of contraception is recommended while taking this medication and for 6 months after the last dose. Talk to your care team about reliable forms of contraception. Do not father a child while taking this medication and for 3 months after the last dose.  Use a condom while having sex during this time period. Do not breastfeed while taking this medication and for 1 week after the last dose. This medication may cause infertility. Talk to your care team if you are concerned about your fertility. What side effects may I notice from receiving this medication? Side effects that you should report to your care team as soon as possible: Allergic reactions--skin rash, itching, hives, swelling of the face, lips, tongue, or throat Dry cough, shortness of breath or trouble breathing Infection--fever, chills, cough, sore throat, wounds that don't heal, pain or trouble when passing urine, general feeling of discomfort or being unwell Kidney injury--decrease in the amount of urine,  swelling of the ankles, hands, or feet Low red blood cell level--unusual weakness or fatigue, dizziness, headache, trouble breathing Redness, blistering, peeling, or loosening of the skin, including inside the mouth Unusual bruising or bleeding Side effects that usually do not require medical attention (report to your care team if they continue or are bothersome): Fatigue Loss of appetite Nausea Vomiting This list may not describe all possible side effects. Call your doctor for medical advice about side effects. You may report side effects to FDA at 1-800-FDA-1088. Where should I keep my medication? This medication is given in a hospital or clinic. It will not be stored at home. NOTE: This sheet is a summary. It may not cover all possible information. If you have questions about this medicine, talk to your doctor, pharmacist, or health care provider.  2024 [employer] Standard (2021-12-21 00:00:00)

## 2023-08-21 NOTE — Progress Notes (Signed)
[facility] [phone]   [phone]  OFFICE PROGRESS NOTE  [name], MD [address] [place] [address]  DIAGNOSIS: Stage IV (T2a, N0, M1b) non-small cell lung cancer, adenocarcinoma with negative EGFR and ALK mutations diagnosed in January 2015 and presented with right upper lobe lung mass in addition to a solitary brain metastasis.  PRIOR THERAPY: 1) Status post stereotactic radiotherapy to a solitary right parietal brain lesion under the care of Dr. Mitzi Hansen on 10/16/2013. 2) Status post palliative radiotherapy to the right lung tumor under the care of Dr. Mitzi Hansen completed on 12/05/2013. 3) Systemic chemotherapy with carboplatin for AUC of 5 and Alimta 500 mg/M2 every 3 weeks. First dose Jan 06 2014. Status post 6 cycles.  CURRENT THERAPY: Systemic chemotherapy with maintenance Alimta 500 MG/M2 every 3 weeks, status post 158 cycles.  INTERVAL HISTORY: Alexis [name] 72 y.o. female returns to the clinic today for follow-up visit accompanied by her husband [name].Discussed the use of AI scribe software for clinical note transcription with the patient, who gave verbal consent to proceed.  History of Present Illness   The patient, a 72 year old with a history of stage 4 carcinoma diagnosed in 2015, has been undergoing chemotherapy with carboplatin and Alimta every three weeks. The patient reports a generally good physical condition, with occasional constipation and persistent sleep disturbances despite taking Zzzquil.  The patient also reports a recent experience of disrespectful treatment from lab staff, which caused significant distress. However, this did not deter the patient from continuing her treatment regimen.  The patient has a history of COVID-19 infection, which was reportedly more severe during the second occurrence, with symptoms including loss of taste for six days and severe coughing. The patient has since tested  negative for COVID-19 and the flu.  The patient's cancer treatment appears to be effective, with no new or worsening symptoms reported. The patient's main concerns are related to sleep disturbances, occasional constipation, and the recent negative experience with lab staff.        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  MEDICATIONS:  Current Outpatient Medications  Medication Sig Dispense Refill   acetaminophen (TYLENOL) 500 MG tablet Take 500 mg by mouth every 6 (six) hours as needed for mild pain or headache. Reported on 11/02/2015     Ascorbic Acid (VITAMIN C GUMMIE PO) Take 1 each by mouth every morning.     aspirin EC 81 MG tablet Take 1 tablet (81 mg total) by mouth daily. 150 tablet 2   Cranberry 240 MG CAPS Take 1 capsule by mouth daily. This is in her probiotic     [employer] ACID CONTROLLER 10 MG tablet SMARTSIG:1 Tablet(s) By Mouth Every 12 Hours PRN     dexamethasone (DECADRON) 4 MG tablet TAKE 1 TABLET TWICE A DAY THE DAY BEFORE, THE DAY OF, AND THE DAY AFTER CHEMOTHERAPY. 40 tablet 2   estradiol (ESTRACE) 0.1 MG/GM vaginal cream Place 1 Applicatorful vaginally daily.     folic acid (FOLVITE) 1 MG tablet TAKE 1 TABLET  BY MOUTH EVERY DAY 90 tablet 0   glimepiride (AMARYL) 2 MG tablet TAKE 1 TABLET BY MOUTH DAILY BEFORE BREAKFAST. 90 tablet 0   loratadine (CLARITIN) 10 MG tablet Take 10 mg by mouth daily.     Multiple Vitamin (MULTIVITAMIN WITH MINERALS) TABS tablet Take 1 tablet by mouth every morning.     omeprazole (PRILOSEC) 20 MG capsule TAKE 1 CAPSULE BY MOUTH EVERY DAY 90 capsule 0   ondansetron  (ZOFRAN) 8 MG tablet TAKE 1 TABLET BY MOUTH BEFORE CHEMO 30 tablet 1   phenazopyridine (PYRIDIUM) 200 MG tablet Take 1 tablet (200 mg total) by mouth every 8 (eight) hours as needed. 10 tablet 0   prochlorperazine (COMPAZINE) 10 MG tablet Take 1 tablet (10 mg total) by mouth every 6 (six) hours as needed for nausea or vomiting. 60 tablet 0   rosuvastatin (CRESTOR) 10 MG tablet Take 1 tablet (10 mg total) by mouth daily. 30 tablet 12   senna-docusate (SENOKOT-S) 8.6-50 MG tablet Take 1 tablet by mouth daily. 30 tablet prn   temazepam (RESTORIL) 15 MG capsule Take 1 capsule (15 mg total) by mouth at bedtime as needed for sleep. 30 capsule 0   trimethoprim (TRIMPEX) 100 MG tablet Take 100 mg by mouth daily.     Vitamin D, Ergocalciferol, (DRISDOL) 1.25 MG (50000 UNIT) CAPS capsule Take 50,000 Units by mouth once a week. Vit d 3     No current facility-administered medications for this visit.    SURGICAL HISTORY:  Past Surgical History:  Procedure Laterality Date   ABDOMINAL HYSTERECTOMY     COLOSTOMY TAKEDOWN N/A 07/10/2014   Procedure: LAPAROSCOPIC LYSIS OF ADHESIONS (90 MIN) LAPAROSCOPIC ASSISTED COLOSTOMY CLOSURE, RIGID PROCTOSIGMOIDOSCOPY;  Surgeon: Avel Peace, MD;  Location: [facility];  Service: General;  Laterality: N/A;   LAPAROTOMY N/A 11/03/2013   Procedure: EXPLORATORY LAPAROTOMY, DRAINAGE OF INTRA  ABDOMINAL ABSCESSES, MOBILIZATION OF SPLENIC FLEXURE, SIGMOID COLECTOMY WITH COLOSTOMY;  Surgeon: Adolph Pollack, MD;  Location: [facility];  Service: General;  Laterality: N/A;   VIDEO BRONCHOSCOPY Bilateral 08/30/2013   Procedure: VIDEO BRONCHOSCOPY WITH FLUORO;  Surgeon: Nyoka Cowden, MD;  Location: [facility];  Service: Cardiopulmonary;  Laterality: Bilateral;    REVIEW OF SYSTEMS:  Constitutional: negative Eyes: negative Ears, nose, mouth, throat, and face: negative Respiratory: positive for dyspnea on exertion Cardiovascular: negative Gastrointestinal:  negative Genitourinary:negative Integument/breast: negative Hematologic/lymphatic: negative Musculoskeletal:negative Neurological: negative Behavioral/Psych: negative Endocrine: negative Allergic/Immunologic: negative   PHYSICAL EXAMINATION: General appearance: alert, cooperative, and no distress Head: Normocephalic, without obvious abnormality, atraumatic Neck: no adenopathy, no JVD, supple, symmetrical, trachea midline, and thyroid not enlarged, symmetric, no tenderness/mass/nodules Lymph nodes: Cervical, supraclavicular, and axillary nodes normal. Resp: clear to auscultation bilaterally Back: symmetric, no curvature. ROM normal. No CVA tenderness. Cardio: regular rate and rhythm, S1, S2 normal, no murmur, click, rub or gallop GI: soft, non-tender; bowel sounds normal; no masses,  no organomegaly Extremities: extremities normal, atraumatic, no cyanosis or edema Neurologic: Alert and oriented X 3, normal strength and tone. Normal symmetric reflexes. Normal coordination and gait   ECOG PERFORMANCE STATUS: 1 - Symptomatic but completely ambulatory   Blood pressure 131/79, pulse 83, temperature 97.8 F (36.6 C), temperature source Temporal, resp. rate 17, height 5\' 4"  (1.626 m), weight 163 lb (73.9 kg), SpO2 100%.  LABORATORY DATA: Lab Results  Component Value Date   WBC 10.4 08/21/2023   HGB 11.5 (L) 08/21/2023   HCT 35.8 (L) 08/21/2023   MCV 101.4 (H) 08/21/2023  PLT 313 08/21/2023      Chemistry      Component Value Date/Time   NA 138 08/21/2023 1121   NA 140 11/03/2022 0844   NA 139 08/14/2017 0837   K 4.0 08/21/2023 1121   K 4.4 08/14/2017 0837   CL 104 08/21/2023 1121   CO2 25 08/21/2023 1121   CO2 24 08/14/2017 0837   BUN 19 08/21/2023 1121   BUN 33 (H) 11/03/2022 0844   BUN 13.3 08/14/2017 0837   CREATININE 0.97 08/21/2023 1121   CREATININE 0.8 08/14/2017 0837      Component Value Date/Time   CALCIUM 9.6 08/21/2023 1121   CALCIUM 9.3 08/14/2017 0837    ALKPHOS 82 08/21/2023 1121   ALKPHOS 107 08/14/2017 0837   AST 11 (L) 08/21/2023 1121   AST 12 08/14/2017 0837   ALT 10 08/21/2023 1121   ALT 12 08/14/2017 0837   BILITOT 0.4 08/21/2023 1121   BILITOT 0.37 08/14/2017 0837       RADIOGRAPHIC STUDIES: CT CHEST ABDOMEN PELVIS W CONTRAST Result Date: 08/16/2023 CLINICAL DATA:  Non-small-cell lung cancer restaging. * Tracking Code: BO *. EXAM: CT CHEST, ABDOMEN, AND PELVIS WITH CONTRAST TECHNIQUE: Multidetector CT imaging of the chest, abdomen and pelvis was performed following the standard protocol during bolus administration of intravenous contrast. RADIATION DOSE REDUCTION: This exam was performed according to the departmental dose-optimization program which includes automated exposure control, adjustment of the mA and/or kV according to patient size and/or use of iterative reconstruction technique. CONTRAST:  OMNIPAQUE IOHEXOL 300 MG/ML  SOLN COMPARISON:  CT 05/02/2023 and older. FINDINGS: CT CHEST FINDINGS Cardiovascular: Heart is nonenlarged. No pericardial effusion. Coronary artery calcifications are seen. Thoracic aorta has a normal course and caliber with mild partially calcified atherosclerotic plaque. There is slightly more significant plaque along the origin of the left subclavian artery. Mediastinum/Nodes: No specific abnormal lymph node enlargement identified in the axillary regions, hilum or mediastinum. Small thyroid gland. Slightly patulous esophagus. Lungs/Pleura: Centrilobular emphysematous lung changes identified bilaterally particularly in the upper lung zones. Some breathing motion. Left lung is without consolidation, pneumothorax or effusion. New from previous there are some tiny ground-glass like, semi-solid nodules left upper lobe. Example measures 5 mm on series 6, image 41. additional foci in the left upper lobe. Right lung is without consolidation, pneumothorax or effusion. Continued pleuroparenchymal thickening and  scarring in the right lung apex with some calcifications and distortion. Overall appearance is similar to previous examination. The previous ground-glass nodules in the right lung base are less visible today. Musculoskeletal: Stable slight distortion of the right first and second rib. Osteopenia. Scattered degenerative changes along the spine. CT ABDOMEN PELVIS FINDINGS Hepatobiliary: Fatty liver infiltration. No space-occupying liver lesion. Gallbladder is present. Pancreas: Unremarkable. No pancreatic ductal dilatation or surrounding inflammatory changes. Spleen: Normal in size without focal abnormality. Adrenals/Urinary Tract: Adrenal glands are preserved. Mild bilateral renal atrophy. No collecting system dilatation or perinephric fluid. Parapelvic left-sided renal cysts. Preserved contours of the urinary bladder. Stomach/Bowel: Surgical changes along the sigmoid colon. On this non oral contrast exam large bowel has a normal course and caliber with scattered colonic stool. Normal appendix. Stomach and small bowel are nondilated. Vascular/Lymphatic: Diffuse vascular calcifications identified along the aorta and branch vessels. 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 Please correlate with symptoms. Normal caliber IVC. No specific abnormal lymph node enlargement identified in the abdomen and pelvis. Reproductive: Status post hysterectomy. Stable cystic area in the left adnexa. Other: No  free air or free fluid. Musculoskeletal: Small fat containing umbilical hernia. Curvature of the spine. Scattered degenerative changes of the spine and pelvis. IMPRESSION: Unchanged appearance of the right apical masslike opacity with distortion. No new lymph node enlargement. Subtle ground-glass nodules right lower lobe are improving are less apparent today. There are some new areas in the left lung. Attention on follow-up. Fatty liver infiltration. Stable surgical changes along the sigmoid colon with diffuse  stool. Diffuse atherosclerotic changes. 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 Please correlate for any particular symptoms. Electronically Signed   By: Karen Kays M.D.   On: 08/16/2023 15:17   MR Brain W Wo Contrast Result Date: 08/03/2023 CLINICAL DATA:  Provided history: Brain metastases, assess treatment response. Metastasis to brain. EXAM: MRI HEAD WITHOUT AND WITH CONTRAST TECHNIQUE: Multiplanar, multiecho pulse sequences of the brain and surrounding structures were obtained without and with intravenous contrast. CONTRAST:  7.5 mL Vueway intravenous contrast. COMPARISON:  Prior brain MRI examinations 01/05/2023 and earlier. FINDINGS: Brain: No age advanced or lobar predominant parenchymal atrophy. A 15 x 10 mm irregularly enhancing lesion within the right parietal lobe is unchanged in size (remeasured on prior). As before, there is irregular susceptibility-weighted signal loss at site of this lesion, likely reflecting chronic blood products. T2 FLAIR hyperintense signal abnormality within the surrounding white matter, also unchanged. No new intracranial metastasis is identified. Multifocal T2 FLAIR hyperintense signal abnormality elsewhere within the cerebral white matter and pons, nonspecific but compatible with moderate chronic small vessel ischemic disease. There is no acute infarct. No extra-axial fluid collection. No midline shift. Vascular: Maintained flow voids within the proximal large arterial vessels. Skull and upper cervical spine: No focal worrisome marrow lesion. Incompletely assessed cervical spondylosis. Sinuses/Orbits: No mass or acute finding within the imaged orbits. Prior but ocular lens replacement. Mild mucosal thickening within the bilateral ethmoid sinuses. Other: Small-volume fluid within the bilateral mastoid air cells. IMPRESSION: 1. As compared to the prior brain MRI of 01/05/2023, the enhancing 15 x 10 mm lesion within the right  parietal lobe has remained stable in size. Surrounding white matter T2 FLAIR hyperintense abnormality has also remained stable. 2. No new intracranial metastasis identified. 3. Moderate chronic small vessel ischemic changes within the cerebral white matter. 4. Mild bilateral ethmoid sinus mucosal thickening. 5. Small-volume fluid within the bilateral mastoid air cells. Electronically Signed   By: Jackey Loge D.O.   On: 08/03/2023 12:07    ASSESSMENT AND PLAN:  This is a very pleasant 72 years old white female with metastatic non-small cell lung cancer, adenocarcinoma status post induction systemic chemotherapy with carboplatin and Alimta with partial response.  The patient has no actionable EGFR or ALK mutations at the time of her diagnosis. The patient is currently on maintenance treatment with single agent Alimta status post 158 cycles. The patient has been tolerating this treatment well with no concerning adverse effects. She had repeat CT scan of the chest, abdomen and pelvis performed recently.  I personally and independently reviewed the scan and discussed the result with the patient and her husband today.  She has no evidence for disease progression.    Non-Small Cell Lung Cancer (NSCLC) NSCLC diagnosed in 2015, currently on 159th cycle of carboplatin and pemetrexed. Recent scans show no new growth; tumor remains small. Patient reports feeling well. Emphasized the importance of continuing current regimen and regular follow-ups. - Continue chemotherapy with carboplatin and pemetrexed - Schedule next treatment in three  weeks  Constipation Intermittent constipation, well-managed. - Continue current management  Insomnia Difficulty sleeping, currently using ZzzQuil. Discussed potential benefits of melatonin. - Recommend trying melatonin for sleep  COVID-19 (History) Recovered from COVID-19 seven months ago, no current symptoms. - No further action required  General Health  Maintenance Advised to report any disrespectful treatment to administration for quality improvement. - Write a note to administration about disrespectful treatment experienced in the lab  Follow-up - Schedule follow-up appointment in three weeks.   For the anemia, the patient will continue on the oral iron tablets. She was advised to call immediately if she has any other concerning symptoms in the interval.  The patient voices understanding of current disease status and treatment options and is in agreement with the current care plan. All questions were answered. The patient knows to call the clinic with any problems, questions or concerns. We can certainly see the patient much sooner if necessary.  Disclaimer: This note was dictated with voice recognition software. Similar sounding words can inadvertently be transcribed and may not be corrected upon review.

## 2023-09-06 NOTE — Progress Notes (Signed)
 [facility] OFFICE PROGRESS NOTE  Sherrod Sherrod, MD [address] [place] [address]  DIAGNOSIS: Stage IV (T2a, N0, M1b) non-small cell lung cancer, adenocarcinoma with negative EGFR and ALK mutations diagnosed in January 2015 and presented with right upper lobe lung mass in addition to a solitary brain metastasis   PRIOR THERAPY: 1) Status post stereotactic radiotherapy to a solitary right parietal brain lesion under the care of Dr. Dewey on 10/16/2013. 2) Status post palliative radiotherapy to the right lung tumor under the care of Dr. Dewey completed on 12/05/2013. 3) Systemic chemotherapy with carboplatin  for AUC of 5 and Alimta  500 mg/M2 every 3 weeks. First dose Jan 06 2014. Status post 6 cycles  CURRENT THERAPY: Systemic chemotherapy with maintenance Alimta  500 MG/M2 every 3 weeks, status post 159 cycles   INTERVAL HISTORY: Alexis [name] 73 y.o. female returns to the clinic today for a follow-up visit. She was last seen by Dr. Sherrod 3 weeks ago. She is undergoing chemotherapy with Alimta  and tolerates it well. She has been on alimta  several years. She is doing well today. Her only concerns are insomnia and constipation.  We prescribed Restoril  over the summer.  She ran out of this.  She states it did help somewhat with her insomnia.  She would be interested in a refill today.  She also struggles with chronic constipation.  She is not taking a stool softener daily.  She reports hard bowel movements with sometimes associated bleeding on the toilet paper when she wipes and some discomfort.  She has been applying Vaseline.  She states she is can follow-up with her gastroenterologist, Dr. Rosalie.  She states she is due for a colonoscopy as well.  She denies any fevers, chills, or night sweats.  She denies any unexplained weight loss.  She denies any current chest pain, significant cough, hemoptysis, or shortness of breath.  She denies any nausea or vomiting.  Denies  any diarrhea. Denies any headache or visual changes at this time.  She follows closely with Dr. Dewey regarding her history of metastatic disease to the brain.  She is here today for evaluation and repeat blood work before undergoing cycle 160  MEDICAL HISTORY: Past Medical History:  Diagnosis Date   Anxiety    Anxiety 06/20/2016   Cervical cancer (HCC)    Diabetes mellitus without complication (HCC)    patient states she has type 2   Encounter for antineoplastic chemotherapy 07/20/2015   Malignant neoplasm of right upper lobe of lung (HCC)     non small cell lung cancer adenocarcioma with brain meta    ALLERGIES:  is allergic to glimepiride  and codeine.  MEDICATIONS:  Current Outpatient Medications  Medication Sig Dispense Refill   acetaminophen  (TYLENOL ) 500 MG tablet Take 500 mg by mouth every 6 (six) hours as needed for mild pain or headache. Reported on 11/02/2015     Ascorbic Acid (VITAMIN C GUMMIE PO) Take 1 each by mouth every morning.     aspirin  EC 81 MG tablet Take 1 tablet (81 mg total) by mouth daily. 150 tablet 2   Cranberry 240 MG CAPS Take 1 capsule by mouth daily. This is in her probiotic     [employer] ACID CONTROLLER 10 MG tablet SMARTSIG:1 Tablet(s) By Mouth Every 12 Hours PRN     dexamethasone  (DECADRON ) 4 MG tablet TAKE 1 TABLET TWICE A DAY THE DAY BEFORE, THE DAY OF, AND THE DAY AFTER CHEMOTHERAPY. 40 tablet 2   estradiol (  ESTRACE) 0.1 MG/GM vaginal cream Place 1 Applicatorful vaginally daily.     folic acid  (FOLVITE ) 1 MG tablet TAKE 1 TABLET BY MOUTH EVERY DAY 90 tablet 0   glimepiride  (AMARYL ) 2 MG tablet TAKE 1 TABLET BY MOUTH DAILY BEFORE BREAKFAST. 90 tablet 0   loratadine (CLARITIN) 10 MG tablet Take 10 mg by mouth daily.     Multiple Vitamin (MULTIVITAMIN WITH MINERALS) TABS tablet Take 1 tablet by mouth every morning.     omeprazole  (PRILOSEC) 20 MG capsule TAKE 1 CAPSULE BY MOUTH EVERY DAY 90 capsule 0   ondansetron  (ZOFRAN ) 8 MG tablet TAKE 1 TABLET BY MOUTH  BEFORE CHEMO 30 tablet 1   phenazopyridine  (PYRIDIUM ) 200 MG tablet Take 1 tablet (200 mg total) by mouth every 8 (eight) hours as needed. 10 tablet 0   prochlorperazine  (COMPAZINE ) 10 MG tablet Take 1 tablet (10 mg total) by mouth every 6 (six) hours as needed for nausea or vomiting. 60 tablet 0   rosuvastatin  (CRESTOR ) 10 MG tablet Take 1 tablet (10 mg total) by mouth daily. 30 tablet 12   senna-docusate (SENOKOT-S) 8.6-50 MG tablet Take 1 tablet by mouth daily. 30 tablet prn   temazepam  (RESTORIL ) 15 MG capsule Take 1 capsule (15 mg total) by mouth at bedtime as needed for sleep. 30 capsule 0   trimethoprim  (TRIMPEX ) 100 MG tablet Take 100 mg by mouth daily.     Vitamin D, Ergocalciferol, (DRISDOL) 1.25 MG (50000 UNIT) CAPS capsule Take 50,000 Units by mouth once a week. Vit d 3     No current facility-administered medications for this visit.    SURGICAL HISTORY:  Past Surgical History:  Procedure Laterality Date   ABDOMINAL HYSTERECTOMY     COLOSTOMY TAKEDOWN N/A 07/10/2014   Procedure: LAPAROSCOPIC LYSIS OF ADHESIONS (90 MIN) LAPAROSCOPIC ASSISTED COLOSTOMY CLOSURE, RIGID PROCTOSIGMOIDOSCOPY;  Surgeon: Krystal Russell, MD;  Location: [facility];  Service: General;  Laterality: N/A;   LAPAROTOMY N/A 11/03/2013   Procedure: EXPLORATORY LAPAROTOMY, DRAINAGE OF INTRA  ABDOMINAL ABSCESSES, MOBILIZATION OF SPLENIC FLEXURE, SIGMOID COLECTOMY WITH COLOSTOMY;  Surgeon: Krystal JINNY Russell, MD;  Location: [facility];  Service: General;  Laterality: N/A;   VIDEO BRONCHOSCOPY Bilateral 08/30/2013   Procedure: VIDEO BRONCHOSCOPY WITH FLUORO;  Surgeon: Ozell KATHEE America, MD;  Location: [facility];  Service: Cardiopulmonary;  Laterality: Bilateral;    REVIEW OF SYSTEMS:   Review of Systems  Constitutional: Negative for appetite change, chills, fatigue, fever and unexpected weight change.  HENT:   Negative for mouth sores, nosebleeds, sore throat and trouble swallowing.   Eyes: Negative for eye problems and  icterus.  Respiratory: Negative for cough, hemoptysis, shortness of breath and wheezing.   Cardiovascular: Negative for chest pain and leg swelling.  Gastrointestinal: Positive for frequent constipation and some rectal bleeding. Negative for abdominal pain, diarrhea, nausea and vomiting.  Genitourinary: Negative for bladder incontinence, difficulty urinating, dysuria, frequency and hematuria.   Musculoskeletal: Negative for back pain, gait problem, neck pain and neck stiffness.  Skin: Negative for itching and rash.  Neurological: Negative for dizziness, extremity weakness, gait problem, headaches, light-headedness and seizures.  Hematological: Negative for adenopathy. Does not bruise/bleed easily.  Psychiatric/Behavioral: Positive for insomnia. Negative for confusion, depression and sleep disturbance. The patient is not nervous/anxious.     PHYSICAL EXAMINATION:  There were no vitals taken for this visit.  ECOG PERFORMANCE STATUS: 1  Physical Exam  Constitutional: Oriented to person, place, and time and well-developed, well-nourished, and in no distress.  HENT:  Head: Normocephalic and atraumatic.  Mouth/Throat: Oropharynx is clear and moist. No oropharyngeal exudate.  Eyes: Conjunctivae are normal. Right eye exhibits no discharge. Left eye exhibits no discharge. No scleral icterus.  Neck: Normal range of motion. Neck supple.  Cardiovascular: Normal rate, regular rhythm, normal heart sounds and intact distal pulses.   Pulmonary/Chest: Effort normal and breath sounds normal. No respiratory distress. No wheezes. No rales.  Abdominal: Soft. Bowel sounds are normal. Exhibits no distension and no mass. There is no tenderness.  Musculoskeletal: Normal range of motion. Exhibits no edema.  Lymphadenopathy:    No cervical adenopathy.  Neurological: Alert and oriented to person, place, and time. Exhibits normal muscle tone. Gait normal. Coordination normal.  Skin: Skin is warm and dry. No rash  noted. Not diaphoretic. No erythema. No pallor. Mild bruising on upper extremities.  Psychiatric: Mood, memory and judgment normal.  Vitals reviewed.  LABORATORY DATA: Lab Results  Component Value Date   WBC 10.4 08/21/2023   HGB 11.5 (L) 08/21/2023   HCT 35.8 (L) 08/21/2023   MCV 101.4 (H) 08/21/2023   PLT 313 08/21/2023      Chemistry      Component Value Date/Time   NA 138 08/21/2023 1121   NA 140 11/03/2022 0844   NA 139 08/14/2017 0837   K 4.0 08/21/2023 1121   K 4.4 08/14/2017 0837   CL 104 08/21/2023 1121   CO2 25 08/21/2023 1121   CO2 24 08/14/2017 0837   BUN 19 08/21/2023 1121   BUN 33 (H) 11/03/2022 0844   BUN 13.3 08/14/2017 0837   CREATININE 0.97 08/21/2023 1121   CREATININE 0.8 08/14/2017 0837      Component Value Date/Time   CALCIUM  9.6 08/21/2023 1121   CALCIUM  9.3 08/14/2017 0837   ALKPHOS 82 08/21/2023 1121   ALKPHOS 107 08/14/2017 0837   AST 11 (L) 08/21/2023 1121   AST 12 08/14/2017 0837   ALT 10 08/21/2023 1121   ALT 12 08/14/2017 0837   BILITOT 0.4 08/21/2023 1121   BILITOT 0.37 08/14/2017 0837       RADIOGRAPHIC STUDIES:  CT CHEST ABDOMEN PELVIS W CONTRAST Result Date: 08/16/2023 CLINICAL DATA:  Non-small-cell lung cancer restaging. * Tracking Code: BO *. EXAM: CT CHEST, ABDOMEN, AND PELVIS WITH CONTRAST TECHNIQUE: Multidetector CT imaging of the chest, abdomen and pelvis was performed following the standard protocol during bolus administration of intravenous contrast. RADIATION DOSE REDUCTION: This exam was performed according to the departmental dose-optimization program which includes automated exposure control, adjustment of the mA and/or kV according to patient size and/or use of iterative reconstruction technique. CONTRAST:  OMNIPAQUE  IOHEXOL  300 MG/ML  SOLN COMPARISON:  CT 05/02/2023 and older. FINDINGS: CT CHEST FINDINGS Cardiovascular: Heart is nonenlarged. No pericardial effusion. Coronary artery calcifications are seen. Thoracic  aorta has a normal course and caliber with mild partially calcified atherosclerotic plaque. There is slightly more significant plaque along the origin of the left subclavian artery. Mediastinum/Nodes: No specific abnormal lymph node enlargement identified in the axillary regions, hilum or mediastinum. Small thyroid  gland. Slightly patulous esophagus. Lungs/Pleura: Centrilobular emphysematous lung changes identified bilaterally particularly in the upper lung zones. Some breathing motion. Left lung is without consolidation, pneumothorax or effusion. New from previous there are some tiny ground-glass like, semi-solid nodules left upper lobe. Example measures 5 mm on series 6, image 41. additional foci in the left upper lobe. Right lung is without consolidation, pneumothorax or effusion. Continued pleuroparenchymal thickening and scarring in the right lung apex with  some calcifications and distortion. Overall appearance is similar to previous examination. The previous ground-glass nodules in the right lung base are less visible today. Musculoskeletal: Stable slight distortion of the right first and second rib. Osteopenia. Scattered degenerative changes along the spine. CT ABDOMEN PELVIS FINDINGS Hepatobiliary: Fatty liver infiltration. No space-occupying liver lesion. Gallbladder is present. Pancreas: Unremarkable. No pancreatic ductal dilatation or surrounding inflammatory changes. Spleen: Normal in size without focal abnormality. Adrenals/Urinary Tract: Adrenal glands are preserved. Mild bilateral renal atrophy. No collecting system dilatation or perinephric fluid. Parapelvic left-sided renal cysts. Preserved contours of the urinary bladder. Stomach/Bowel: Surgical changes along the sigmoid colon. On this non oral contrast exam large bowel has a normal course and caliber with scattered colonic stool. Normal appendix. Stomach and small bowel are nondilated. Vascular/Lymphatic: Diffuse vascular calcifications identified  along the aorta and branch vessels. 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 Please correlate with symptoms. Normal caliber IVC. No specific abnormal lymph node enlargement identified in the abdomen and pelvis. Reproductive: Status post hysterectomy. Stable cystic area in the left adnexa. Other: No free air or free fluid. Musculoskeletal: Small fat containing umbilical hernia. Curvature of the spine. Scattered degenerative changes of the spine and pelvis. IMPRESSION: Unchanged appearance of the right apical masslike opacity with distortion. No new lymph node enlargement. Subtle ground-glass nodules right lower lobe are improving are less apparent today. There are some new areas in the left lung. Attention on follow-up. Fatty liver infiltration. Stable surgical changes along the sigmoid colon with diffuse stool. Diffuse atherosclerotic changes. 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 Please correlate for any particular symptoms. Electronically Signed   By: Ranell Bring M.D.   On: 08/16/2023 15:17     ASSESSMENT/PLAN:  This is a very pleasant 73 year old Caucasian female with stage IV non-small cell lung cancer, adenocarcinoma who presented with a right upper lobe lung mass in addition to a solitary brain metastatsis.  She was diagnosed in January 2015.    The patient had completed induction systemic chemotherapy with carboplatin  and Alimta  with a partial response.   The patient is currently being treated with single  agent maintenance Alimta .  She is status post 159 cycles.     Labs were reviewed. Recommend that she proceed with #160 today as scheduled.     We will see her back for follow-up visit in 3 weeks for evaluation repeat blood work before undergoing cycle #161   She had a small nodule that was seen on prior imaging.  She previously saw Dr. Dewey to see if they would recommend SBRT to a small 6 mm nodule. Dr. Dewey recommended close  monitoring and would reconsider if lesion became more progressive/solid.   I discussed with the patient is likely a good idea to take a laxative or stool softener daily to keep her bowels regular.  We also talked about avoiding foods that may exacerbate constipation.  We also talked about foods that will help facilitate bowel movements such as fruits and vegetables and water.  I refilled her Restoril  for her insomnia.    The patient was advised to call immediately if she has any concerning symptoms in the interval. The patient voices understanding of current disease status and treatment options and is in agreement with the current care plan. All questions were answered. The patient knows to call the clinic with any problems, questions or concerns. We can certainly see the patient much sooner if necessary  No orders of the defined types were placed in this encounter.    The total time spent in the appointment was 20-29 minutes  Reata Petrov L Castor Gittleman, PA-C 09/06/23

## 2023-09-11 ENCOUNTER — Inpatient Hospital Stay: Payer: Medicare Other

## 2023-09-11 ENCOUNTER — Inpatient Hospital Stay (HOSPITAL_BASED_OUTPATIENT_CLINIC_OR_DEPARTMENT_OTHER): Payer: Medicare Other | Admitting: Physician Assistant

## 2023-09-11 ENCOUNTER — Inpatient Hospital Stay: Payer: Medicare Other | Attending: Internal Medicine

## 2023-09-11 VITALS — BP 121/74 | HR 88 | Temp 97.6°F | Resp 18 | Wt 164.8 lb

## 2023-09-11 DIAGNOSIS — C7931 Secondary malignant neoplasm of brain: Secondary | ICD-10-CM | POA: Insufficient documentation

## 2023-09-11 DIAGNOSIS — Z5111 Encounter for antineoplastic chemotherapy: Secondary | ICD-10-CM | POA: Insufficient documentation

## 2023-09-11 DIAGNOSIS — C3411 Malignant neoplasm of upper lobe, right bronchus or lung: Secondary | ICD-10-CM

## 2023-09-11 LAB — CMP (CANCER CENTER ONLY)
ALT: 8 U/L (ref 0–44)
AST: 10 U/L — ABNORMAL LOW (ref 15–41)
Albumin: 3.8 g/dL (ref 3.5–5.0)
Alkaline Phosphatase: 84 U/L (ref 38–126)
Anion gap: 8 (ref 5–15)
BUN: 15 mg/dL (ref 8–23)
CO2: 27 mmol/L (ref 22–32)
Calcium: 9.5 mg/dL (ref 8.9–10.3)
Chloride: 103 mmol/L (ref 98–111)
Creatinine: 0.79 mg/dL (ref 0.44–1.00)
GFR, Estimated: >60 mL/min (ref >60)
Glucose, Bld: 143 mg/dL — ABNORMAL HIGH (ref 70–99)
Potassium: 3.9 mmol/L (ref 3.5–5.1)
Sodium: 138 mmol/L (ref 135–145)
Total Bilirubin: 0.4 mg/dL (ref 0.0–1.2)
Total Protein: 6.6 g/dL (ref 6.5–8.1)

## 2023-09-11 LAB — CBC WITH DIFFERENTIAL (CANCER CENTER ONLY)
Abs Immature Granulocytes: 0.07 10*3/uL (ref 0.00–0.07)
Basophils Absolute: 0 10*3/uL (ref 0.0–0.1)
Basophils Relative: 0 %
Eosinophils Absolute: 0 10*3/uL (ref 0.0–0.5)
Eosinophils Relative: 0 %
HCT: 35.6 % — ABNORMAL LOW (ref 36.0–46.0)
Hemoglobin: 11.3 g/dL — ABNORMAL LOW (ref 12.0–15.0)
Immature Granulocytes: 1 %
Lymphocytes Relative: 6 %
Lymphs Abs: 0.5 10*3/uL — ABNORMAL LOW (ref 0.7–4.0)
MCH: 31.8 pg (ref 26.0–34.0)
MCHC: 31.7 g/dL (ref 30.0–36.0)
MCV: 100.3 fL — ABNORMAL HIGH (ref 80.0–100.0)
Monocytes Absolute: 0.6 10*3/uL (ref 0.1–1.0)
Monocytes Relative: 7 %
Neutro Abs: 7.5 10*3/uL (ref 1.7–7.7)
Neutrophils Relative %: 86 %
Platelet Count: 359 10*3/uL (ref 150–400)
RBC: 3.55 MIL/uL — ABNORMAL LOW (ref 3.87–5.11)
RDW: 14.9 % (ref 11.5–15.5)
WBC Count: 8.7 10*3/uL (ref 4.0–10.5)
nRBC: 0 % (ref 0.0–0.2)

## 2023-09-11 MED ORDER — SODIUM CHLORIDE 0.9 % IV SOLN
500.0000 mg/m2 | Freq: Once | INTRAVENOUS | Status: AC
  Administered 2023-09-11: 900 mg via INTRAVENOUS
  Filled 2023-09-11: qty 20

## 2023-09-11 MED ORDER — DEXAMETHASONE SODIUM PHOSPHATE 10 MG/ML IJ SOLN
10.0000 mg | Freq: Once | INTRAMUSCULAR | Status: AC
  Administered 2023-09-11: 10 mg via INTRAVENOUS
  Filled 2023-09-11: qty 1

## 2023-09-11 MED ORDER — TEMAZEPAM 15 MG PO CAPS: 15.0000 mg | ORAL_CAPSULE | Freq: Every evening | ORAL | 0 refills | Status: DC | PRN

## 2023-09-11 MED ORDER — SODIUM CHLORIDE 0.9 % IV SOLN: Freq: Once | INTRAVENOUS | Status: AC

## 2023-09-11 MED ORDER — CYANOCOBALAMIN 1000 MCG/ML IJ SOLN
1000.0000 ug | Freq: Once | INTRAMUSCULAR | Status: AC
  Administered 2023-09-11: 1000 ug via INTRAMUSCULAR
  Filled 2023-09-11: qty 1

## 2023-09-11 NOTE — Patient Instructions (Signed)
 [facility] - A DEPT OF [facility]  Discharge Instructions: Thank you for choosing [facility] to provide your oncology and hematology care.   If you have a lab appointment with the Cancer Center, please go directly to the Cancer Center and check in at the registration area.   Wear comfortable clothing and clothing appropriate for easy access to any Portacath or PICC line.   We strive to give you quality time with your provider. You may need to reschedule your appointment if you arrive late (15 or more minutes).  Arriving late affects you and other patients whose appointments are after yours.  Also, if you miss three or more appointments without notifying the office, you may be dismissed from the clinic at the provider's discretion.      For prescription refill requests, have your pharmacy contact our office and allow 72 hours for refills to be completed.    Today you received the following chemotherapy and/or immunotherapy agents Alimta      To help prevent nausea and vomiting after your treatment, we encourage you to take your nausea medication as directed.  BELOW ARE SYMPTOMS THAT SHOULD BE REPORTED IMMEDIATELY: *FEVER GREATER THAN 100.4 F (38 C) OR HIGHER *CHILLS OR SWEATING *NAUSEA AND VOMITING THAT IS NOT CONTROLLED WITH YOUR NAUSEA MEDICATION *UNUSUAL SHORTNESS OF BREATH *UNUSUAL BRUISING OR BLEEDING *URINARY PROBLEMS (pain or burning when urinating, or frequent urination) *BOWEL PROBLEMS (unusual diarrhea, constipation, pain near the anus) TENDERNESS IN MOUTH AND THROAT WITH OR WITHOUT PRESENCE OF ULCERS (sore throat, sores in mouth, or a toothache) UNUSUAL RASH, SWELLING OR PAIN  UNUSUAL VAGINAL DISCHARGE OR ITCHING   Items with * indicate a potential emergency and should be followed up as soon as possible or go to the Emergency Department if any problems should occur.  Please show the CHEMOTHERAPY ALERT CARD or IMMUNOTHERAPY  ALERT CARD at check-in to the Emergency Department and triage nurse.  Should you have questions after your visit or need to cancel or reschedule your appointment, please contact [facility] - A DEPT OF [facility]  Dept: [phone]  and follow the prompts.  Office hours are 8:00 a.m. to 4:30 p.m. Monday - Friday. Please note that voicemails left after 4:00 p.m. may not be returned until the following business day.  We are closed weekends and major holidays. You have access to a nurse at all times for urgent questions. Please call the main number to the clinic Dept: [phone] and follow the prompts.   For any non-urgent questions, you may also contact your provider using MyChart. We now offer e-Visits for anyone 79 and older to request care online for non-urgent symptoms. For details visit [email or web address].   Also download the MyChart app! Go to the app store, search "MyChart", open the app, select [facility], and log in with your MyChart username and password.

## 2023-09-21 ENCOUNTER — Other Ambulatory Visit: Payer: Self-pay | Admitting: Physician Assistant

## 2023-09-21 DIAGNOSIS — C3411 Malignant neoplasm of upper lobe, right bronchus or lung: Secondary | ICD-10-CM

## 2023-09-25 DIAGNOSIS — R300 Dysuria: Secondary | ICD-10-CM | POA: Diagnosis not present

## 2023-09-25 DIAGNOSIS — N3001 Acute cystitis with hematuria: Secondary | ICD-10-CM | POA: Diagnosis not present

## 2023-09-26 NOTE — Progress Notes (Signed)
[facility] OFFICE PROGRESS NOTE  [name], MD [address] [place] [address]  DIAGNOSIS: Stage IV (T2a, N0, M1b) non-small cell lung cancer, adenocarcinoma with negative EGFR and ALK mutations diagnosed in January 2015 and presented with right upper lobe lung mass in addition to a solitary brain metastasis   PRIOR THERAPY: 1) Status post stereotactic radiotherapy to a solitary right parietal brain lesion under the care of Dr. Mitzi Hansen on 10/16/2013. 2) Status post palliative radiotherapy to the right lung tumor under the care of Dr. Mitzi Hansen completed on 12/05/2013. 3) Systemic chemotherapy with carboplatin for AUC of 5 and Alimta 500 mg/M2 every 3 weeks. First dose Jan 06 2014. Status post 6 cycles  CURRENT THERAPY: Systemic chemotherapy with maintenance Alimta 500 MG/M2 every 3 weeks, status post 160 cycles   INTERVAL HISTORY: Alexis [name] 72 y.o. female returns to the clinic today for a follow-up visit. She was last seen by Dr. Arbutus Ped 3 weeks ago. She is undergoing chemotherapy with Alimta and tolerates it well. She has been on alimta several years. She is doing well today. At her last appointment, her concerns were related to insomnia and constipation.  She restarted Restoril which has helped significantly.  She also reports that her constipation is improved as well.    She did have a UTI in the interval and went to an urgent care. This is better at this time. She has recurrent UTIs.  She denies any fevers, chills, or night sweats.  She denies any unexplained weight loss.  She denies any current chest pain, significant cough, hemoptysis, or shortness of breath.  She denies any nausea or vomiting.  Denies any diarrhea. Denies any visual changes at this time.  She may have a rare mild headache for which she takes tylenol which resolves her symptoms. She follows closely with Dr. Mitzi Hansen regarding her history of metastatic disease to the brain.  She is here today  for evaluation and repeat blood work before undergoing cycle 161.    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  MEDICATIONS:  Current Outpatient Medications  Medication Sig Dispense Refill   acetaminophen (TYLENOL) 500 MG tablet Take 500 mg by mouth every 6 (six) hours as needed for mild pain or headache. Reported on 11/02/2015     Ascorbic Acid (VITAMIN C GUMMIE PO) Take 1 each by mouth every morning.     aspirin EC 81 MG tablet Take 1 tablet (81 mg total) by mouth daily. 150 tablet 2   Cranberry 240 MG CAPS Take 1 capsule by mouth daily. This is in her probiotic     [employer] ACID CONTROLLER 10 MG tablet SMARTSIG:1 Tablet(s) By Mouth Every 12 Hours PRN     dexamethasone (DECADRON) 4 MG tablet TAKE 1 TABLET TWICE A DAY THE DAY BEFORE, THE DAY OF, AND THE DAY AFTER CHEMOTHERAPY. 40 tablet 2   estradiol (ESTRACE) 0.1 MG/GM vaginal cream Place 1 Applicatorful vaginally daily.     folic acid (FOLVITE) 1 MG tablet TAKE 1 TABLET BY MOUTH EVERY DAY 90 tablet 0  glimepiride (AMARYL) 2 MG tablet TAKE 1 TABLET BY MOUTH DAILY BEFORE BREAKFAST. 90 tablet 0   loratadine (CLARITIN) 10 MG tablet Take 10 mg by mouth daily.     Multiple Vitamin (MULTIVITAMIN WITH MINERALS) TABS tablet Take 1 tablet by mouth every morning.     omeprazole (PRILOSEC) 20 MG capsule TAKE 1 CAPSULE BY MOUTH EVERY DAY 90 capsule 0   ondansetron (ZOFRAN) 8 MG tablet TAKE 1 TABLET BY MOUTH BEFORE CHEMO 30 tablet 1   phenazopyridine (PYRIDIUM) 200 MG tablet Take 1 tablet (200 mg total) by mouth every 8 (eight) hours as needed. 10 tablet 0   prochlorperazine  (COMPAZINE) 10 MG tablet Take 1 tablet (10 mg total) by mouth every 6 (six) hours as needed for nausea or vomiting. 60 tablet 0   rosuvastatin (CRESTOR) 10 MG tablet Take 1 tablet (10 mg total) by mouth daily. 30 tablet 12   senna-docusate (SENOKOT-S) 8.6-50 MG tablet Take 1 tablet by mouth daily. 30 tablet prn   temazepam (RESTORIL) 15 MG capsule Take 1 capsule (15 mg total) by mouth at bedtime as needed for sleep. 30 capsule 0   trimethoprim (TRIMPEX) 100 MG tablet Take 100 mg by mouth daily.     Vitamin D, Ergocalciferol, (DRISDOL) 1.25 MG (50000 UNIT) CAPS capsule Take 50,000 Units by mouth once a week. Vit d 3     No current facility-administered medications for this visit.    SURGICAL HISTORY:  Past Surgical History:  Procedure Laterality Date   ABDOMINAL HYSTERECTOMY     COLOSTOMY TAKEDOWN N/A 07/10/2014   Procedure: LAPAROSCOPIC LYSIS OF ADHESIONS (90 MIN) LAPAROSCOPIC ASSISTED COLOSTOMY CLOSURE, RIGID PROCTOSIGMOIDOSCOPY;  Surgeon: Avel Peace, MD;  Location: [facility];  Service: General;  Laterality: N/A;   LAPAROTOMY N/A 11/03/2013   Procedure: EXPLORATORY LAPAROTOMY, DRAINAGE OF INTRA  ABDOMINAL ABSCESSES, MOBILIZATION OF SPLENIC FLEXURE, SIGMOID COLECTOMY WITH COLOSTOMY;  Surgeon: Adolph Pollack, MD;  Location: [facility];  Service: General;  Laterality: N/A;   VIDEO BRONCHOSCOPY Bilateral 08/30/2013   Procedure: VIDEO BRONCHOSCOPY WITH FLUORO;  Surgeon: Nyoka Cowden, MD;  Location: [facility];  Service: Cardiopulmonary;  Laterality: Bilateral;    REVIEW OF SYSTEMS:   Constitutional: Negative for appetite change, chills, fatigue, fever and unexpected weight change.  HENT: Negative for mouth sores, nosebleeds, sore throat and trouble swallowing.   Eyes: Negative for eye problems and icterus.  Respiratory: Negative for cough, hemoptysis, shortness of breath and wheezing.   Cardiovascular: Negative for chest pain and leg swelling.  Gastrointestinal: Improved constipation.  Negative for abdominal pain, diarrhea, nausea and vomiting.  Genitourinary: Negative for bladder incontinence, difficulty urinating, dysuria, frequency and hematuria.   Musculoskeletal: Negative for back pain, gait problem, neck pain and neck stiffness.  Skin: Negative for itching and rash.  Neurological: Negative for dizziness, extremity weakness, gait problem, headaches, light-headedness and seizures.  Hematological: Negative for adenopathy. Does not bruise/bleed easily.  Psychiatric/Behavioral: Improved insomnia. Negative for confusion, depression and sleep disturbance. The patient is not nervous/anxious.    PHYSICAL EXAMINATION:  There were no vitals taken for this visit.  ECOG PERFORMANCE STATUS: 1  Physical Exam  Constitutional: Oriented to person, place, and time and well-developed, well-nourished, and in no distress.  HENT:  Head: Normocephalic and atraumatic.  Mouth/Throat: Oropharynx is clear and moist. No oropharyngeal exudate.  Eyes: Conjunctivae are normal. Right eye exhibits no discharge. Left eye exhibits no discharge. No scleral icterus.  Neck: Normal range of motion. Neck supple.  Cardiovascular: Normal rate, regular rhythm,  normal heart sounds and intact distal pulses.   Pulmonary/Chest: Effort normal and breath sounds normal. No respiratory distress. No wheezes. No rales.  Abdominal: Soft. Bowel sounds are normal. Exhibits no distension and no mass. There is no tenderness.  Musculoskeletal: Normal range of motion. Exhibits no edema.  Lymphadenopathy:    No cervical adenopathy.  Neurological: Alert and oriented to person, place, and time. Exhibits normal muscle tone. Gait normal. Coordination normal.  Skin: Skin is warm and dry. No rash noted. Not diaphoretic. No erythema. No pallor. Mild bruising on upper extremities.  Psychiatric: Mood, memory and judgment normal.  Vitals reviewed.  LABORATORY DATA: Lab Results  Component Value Date   WBC 8.7 09/11/2023   HGB  11.3 (L) 09/11/2023   HCT 35.6 (L) 09/11/2023   MCV 100.3 (H) 09/11/2023   PLT 359 09/11/2023      Chemistry      Component Value Date/Time   NA 138 09/11/2023 0853   NA 140 11/03/2022 0844   NA 139 08/14/2017 0837   K 3.9 09/11/2023 0853   K 4.4 08/14/2017 0837   CL 103 09/11/2023 0853   CO2 27 09/11/2023 0853   CO2 24 08/14/2017 0837   BUN 15 09/11/2023 0853   BUN 33 (H) 11/03/2022 0844   BUN 13.3 08/14/2017 0837   CREATININE 0.79 09/11/2023 0853   CREATININE 0.8 08/14/2017 0837      Component Value Date/Time   CALCIUM 9.5 09/11/2023 0853   CALCIUM 9.3 08/14/2017 0837   ALKPHOS 84 09/11/2023 0853   ALKPHOS 107 08/14/2017 0837   AST 10 (L) 09/11/2023 0853   AST 12 08/14/2017 0837   ALT 8 09/11/2023 0853   ALT 12 08/14/2017 0837   BILITOT 0.4 09/11/2023 0853   BILITOT 0.37 08/14/2017 0837       RADIOGRAPHIC STUDIES:  No results found.   ASSESSMENT/PLAN:  This is a very pleasant 73 year old Caucasian female with stage IV non-small cell lung cancer, adenocarcinoma who presented with a right upper lobe lung mass in addition to a solitary brain metastatsis.  She was diagnosed in January 2015.    The patient had completed induction systemic chemotherapy with carboplatin and Alimta with a partial response.   The patient is currently being treated with single  agent maintenance Alimta.  She is status post 160 cycles.     Labs were reviewed. Recommend that she proceed with #161 today as scheduled.     We will see her back for follow-up visit in 3 weeks for evaluation repeat blood work before undergoing cycle #162   She had a small nodule that was seen on prior imaging.  She previously saw Dr. Mitzi Hansen to see if they would recommend SBRT to a small 6 mm nodule. Dr. Mitzi Hansen recommended close monitoring and would reconsider if lesion became more progressive/solid.   She will continue to take restoril for her insomnia.    The patient was advised to call immediately if she  has any concerning symptoms in the interval. The patient voices understanding of current disease status and treatment options and is in agreement with the current care plan. All questions were answered. The patient knows to call the clinic with any problems, questions or concerns. We can certainly see the patient much sooner if necessary    No orders of the defined types were placed in this encounter.    The total time spent in the appointment was 20-29 minutes  Vincenzo Stave L Cariana Karge, PA-C 09/26/23

## 2023-10-02 ENCOUNTER — Inpatient Hospital Stay (HOSPITAL_BASED_OUTPATIENT_CLINIC_OR_DEPARTMENT_OTHER): Payer: Medicare Other | Admitting: Physician Assistant

## 2023-10-02 ENCOUNTER — Inpatient Hospital Stay: Payer: Medicare Other

## 2023-10-02 ENCOUNTER — Inpatient Hospital Stay: Payer: Medicare Other | Attending: Internal Medicine

## 2023-10-02 VITALS — BP 158/64 | HR 111 | Temp 98.0°F | Resp 18 | Wt 164.7 lb

## 2023-10-02 VITALS — BP 132/83 | HR 98 | Temp 97.8°F | Resp 18

## 2023-10-02 DIAGNOSIS — D649 Anemia, unspecified: Secondary | ICD-10-CM | POA: Insufficient documentation

## 2023-10-02 DIAGNOSIS — C3411 Malignant neoplasm of upper lobe, right bronchus or lung: Secondary | ICD-10-CM | POA: Insufficient documentation

## 2023-10-02 DIAGNOSIS — Z5111 Encounter for antineoplastic chemotherapy: Secondary | ICD-10-CM

## 2023-10-02 DIAGNOSIS — N390 Urinary tract infection, site not specified: Secondary | ICD-10-CM | POA: Diagnosis not present

## 2023-10-02 DIAGNOSIS — C7931 Secondary malignant neoplasm of brain: Secondary | ICD-10-CM | POA: Insufficient documentation

## 2023-10-02 DIAGNOSIS — Z79899 Other long term (current) drug therapy: Secondary | ICD-10-CM | POA: Diagnosis not present

## 2023-10-02 LAB — CMP (CANCER CENTER ONLY)
ALT: 9 U/L (ref 0–44)
AST: 10 U/L — ABNORMAL LOW (ref 15–41)
Albumin: 4 g/dL (ref 3.5–5.0)
Alkaline Phosphatase: 90 U/L (ref 38–126)
Anion gap: 9 (ref 5–15)
BUN: 19 mg/dL (ref 8–23)
CO2: 22 mmol/L (ref 22–32)
Calcium: 9.4 mg/dL (ref 8.9–10.3)
Chloride: 107 mmol/L (ref 98–111)
Creatinine: 0.82 mg/dL (ref 0.44–1.00)
GFR, Estimated: >60 mL/min (ref >60)
Glucose, Bld: 200 mg/dL — ABNORMAL HIGH (ref 70–99)
Potassium: 3.8 mmol/L (ref 3.5–5.1)
Sodium: 138 mmol/L (ref 135–145)
Total Bilirubin: 0.4 mg/dL (ref 0.0–1.2)
Total Protein: 6.9 g/dL (ref 6.5–8.1)

## 2023-10-02 LAB — CBC WITH DIFFERENTIAL (CANCER CENTER ONLY)
Abs Immature Granulocytes: 0.08 10*3/uL — ABNORMAL HIGH (ref 0.00–0.07)
Basophils Absolute: 0 10*3/uL (ref 0.0–0.1)
Basophils Relative: 0 %
Eosinophils Absolute: 0 10*3/uL (ref 0.0–0.5)
Eosinophils Relative: 0 %
HCT: 36.8 % (ref 36.0–46.0)
Hemoglobin: 11.7 g/dL — ABNORMAL LOW (ref 12.0–15.0)
Immature Granulocytes: 1 %
Lymphocytes Relative: 5 %
Lymphs Abs: 0.5 10*3/uL — ABNORMAL LOW (ref 0.7–4.0)
MCH: 32.2 pg (ref 26.0–34.0)
MCHC: 31.8 g/dL (ref 30.0–36.0)
MCV: 101.4 fL — ABNORMAL HIGH (ref 80.0–100.0)
Monocytes Absolute: 0.3 10*3/uL (ref 0.1–1.0)
Monocytes Relative: 3 %
Neutro Abs: 9.1 10*3/uL — ABNORMAL HIGH (ref 1.7–7.7)
Neutrophils Relative %: 91 %
Platelet Count: 320 10*3/uL (ref 150–400)
RBC: 3.63 MIL/uL — ABNORMAL LOW (ref 3.87–5.11)
RDW: 15.3 % (ref 11.5–15.5)
WBC Count: 10 10*3/uL (ref 4.0–10.5)
nRBC: 0 % (ref 0.0–0.2)

## 2023-10-02 MED ORDER — SODIUM CHLORIDE 0.9 % IV SOLN
500.0000 mg/m2 | Freq: Once | INTRAVENOUS | Status: AC
  Administered 2023-10-02: 900 mg via INTRAVENOUS
  Filled 2023-10-02: qty 20

## 2023-10-02 MED ORDER — DEXAMETHASONE SODIUM PHOSPHATE 10 MG/ML IJ SOLN
10.0000 mg | Freq: Once | INTRAMUSCULAR | Status: AC
  Administered 2023-10-02: 10 mg via INTRAVENOUS
  Filled 2023-10-02: qty 1

## 2023-10-02 MED ORDER — SODIUM CHLORIDE 0.9 % IV SOLN: Freq: Once | INTRAVENOUS | Status: AC

## 2023-10-02 NOTE — Patient Instructions (Signed)
[facility] - A DEPT OF [facility]  Discharge Instructions: Thank you for choosing [facility] to provide your oncology and hematology care.   If you have a lab appointment with the Cancer Center, please go directly to the Cancer Center and check in at the registration area.   Wear comfortable clothing and clothing appropriate for easy access to any Portacath or PICC line.   We strive to give you quality time with your provider. You may need to reschedule your appointment if you arrive late (15 or more minutes).  Arriving late affects you and other patients whose appointments are after yours.  Also, if you miss three or more appointments without notifying the office, you may be dismissed from the clinic at the provider's discretion.      For prescription refill requests, have your pharmacy contact our office and allow 72 hours for refills to be completed.    Today you received the following chemotherapy and/or immunotherapy agent: Pemetrexed (Alimta)      To help prevent nausea and vomiting after your treatment, we encourage you to take your nausea medication as directed.  BELOW ARE SYMPTOMS THAT SHOULD BE REPORTED IMMEDIATELY: *FEVER GREATER THAN 100.4 F (38 C) OR HIGHER *CHILLS OR SWEATING *NAUSEA AND VOMITING THAT IS NOT CONTROLLED WITH YOUR NAUSEA MEDICATION *UNUSUAL SHORTNESS OF BREATH *UNUSUAL BRUISING OR BLEEDING *URINARY PROBLEMS (pain or burning when urinating, or frequent urination) *BOWEL PROBLEMS (unusual diarrhea, constipation, pain near the anus) TENDERNESS IN MOUTH AND THROAT WITH OR WITHOUT PRESENCE OF ULCERS (sore throat, sores in mouth, or a toothache) UNUSUAL RASH, SWELLING OR PAIN  UNUSUAL VAGINAL DISCHARGE OR ITCHING   Items with * indicate a potential emergency and should be followed up as soon as possible or go to the Emergency Department if any problems should occur.  Please show the CHEMOTHERAPY ALERT CARD or  IMMUNOTHERAPY ALERT CARD at check-in to the Emergency Department and triage nurse.  Should you have questions after your visit or need to cancel or reschedule your appointment, please contact [facility] - A DEPT OF [facility]  Dept: [phone]  and follow the prompts.  Office hours are 8:00 a.m. to 4:30 p.m. Monday - Friday. Please note that voicemails left after 4:00 p.m. may not be returned until the following business day.  We are closed weekends and major holidays. You have access to a nurse at all times for urgent questions. Please call the main number to the clinic Dept: [phone] and follow the prompts.   For any non-urgent questions, you may also contact your provider using MyChart. We now offer e-Visits for anyone 89 and older to request care online for non-urgent symptoms. For details visit [email or web address].   Also download the MyChart app! Go to the app store, search "MyChart", open the app, select [facility], and log in with your MyChart username and password.  Pemetrexed Injection What is this medication? PEMETREXED (PEM e TREX ed) treats some types of cancer. It works by slowing down the growth of cancer cells. This medicine may be used for other purposes; ask your health care provider or pharmacist if you have questions. COMMON BRAND NAME(S): Alimta, PEMFEXY, PEMRYDI RTU What should I tell my care team before I take this medication? They need to know if you have any of these conditions: Infection, such as chickenpox, cold sores, or herpes Kidney disease Low blood cell levels (white cells,  red cells, and platelets) Lung or breathing disease, such as asthma Radiation therapy An unusual or allergic reaction to pemetrexed, other medications, foods, dyes, or preservatives If you or your partner are pregnant or trying to get pregnant Breast-feeding How should I use this medication? This medication is injected into a vein. It is given by  your care team in a hospital or clinic setting. Talk to your care team about the use of this medication in children. Special care may be needed. Overdosage: If you think you have taken too much of this medicine contact a poison control center or emergency room at once. NOTE: This medicine is only for you. Do not share this medicine with others. What if I miss a dose? Keep appointments for follow-up doses. It is important not to miss your dose. Call your care team if you are unable to keep an appointment. What may interact with this medication? Do not take this medication with any of the following: Live virus vaccines This medication may also interact with the following: Ibuprofen This list may not describe all possible interactions. Give your health care provider a list of all the medicines, herbs, non-prescription drugs, or dietary supplements you use. Also tell them if you smoke, drink alcohol, or use illegal drugs. Some items may interact with your medicine. What should I watch for while using this medication? Your condition will be monitored carefully while you are receiving this medication. This medication may make you feel generally unwell. This is not uncommon as chemotherapy can affect healthy cells as well as cancer cells. Report any side effects. Continue your course of treatment even though you feel ill unless your care team tells you to stop. This medication can cause serious side effects. To reduce the risk, your care team may give you other medications to take before receiving this one. Be sure to follow the directions from your care team. This medication can cause a rash or redness in areas of the body that have previously had radiation therapy. If you have had radiation therapy, tell your care team if you notice a rash in this area. This medication may increase your risk of getting an infection. Call your care team for advice if you get a fever, chills, sore throat, or other symptoms  of a cold or flu. Do not treat yourself. Try to avoid being around people who are sick. Be careful brushing or flossing your teeth or using a toothpick because you may get an infection or bleed more easily. If you have any dental work done, tell your dentist you are receiving this medication. Avoid taking medications that contain aspirin, acetaminophen, ibuprofen, naproxen, or ketoprofen unless instructed by your care team. These medications may hide a fever. Check with your care team if you have severe diarrhea, nausea, and vomiting, or if you sweat a lot. The loss of too much body fluid may make it dangerous for you to take this medication. Talk to your care team if you or your partner wish to become pregnant or think either of you might be pregnant. This medication can cause serious birth defects if taken during pregnancy and for 6 months after the last dose. A negative pregnancy test is required before starting this medication. A reliable form of contraception is recommended while taking this medication and for 6 months after the last dose. Talk to your care team about reliable forms of contraception. Do not father a child while taking this medication and for 3 months after the last  dose. Use a condom while having sex during this time period. Do not breastfeed while taking this medication and for 1 week after the last dose. This medication may cause infertility. Talk to your care team if you are concerned about your fertility. What side effects may I notice from receiving this medication? Side effects that you should report to your care team as soon as possible: Allergic reactions--skin rash, itching, hives, swelling of the face, lips, tongue, or throat Dry cough, shortness of breath or trouble breathing Infection--fever, chills, cough, sore throat, wounds that don't heal, pain or trouble when passing urine, general feeling of discomfort or being unwell Kidney injury--decrease in the amount of urine,  swelling of the ankles, hands, or feet Low red blood cell level--unusual weakness or fatigue, dizziness, headache, trouble breathing Redness, blistering, peeling, or loosening of the skin, including inside the mouth Unusual bruising or bleeding Side effects that usually do not require medical attention (report to your care team if they continue or are bothersome): Fatigue Loss of appetite Nausea Vomiting This list may not describe all possible side effects. Call your doctor for medical advice about side effects. You may report side effects to FDA at 1-800-FDA-1088. Where should I keep my medication? This medication is given in a hospital or clinic. It will not be stored at home. NOTE: This sheet is a summary. It may not cover all possible information. If you have questions about this medicine, talk to your doctor, pharmacist, or health care provider.  2024 Elsevier/Gold Standard (2021-12-21 00:00:00)

## 2023-10-13 DIAGNOSIS — N3020 Other chronic cystitis without hematuria: Secondary | ICD-10-CM | POA: Diagnosis not present

## 2023-10-13 DIAGNOSIS — R311 Benign essential microscopic hematuria: Secondary | ICD-10-CM | POA: Diagnosis not present

## 2023-10-21 NOTE — Progress Notes (Signed)
 Updated pemetrexed ERX to [id number]   Pryor Ochoa, PharmD 10/21/23

## 2023-10-23 ENCOUNTER — Inpatient Hospital Stay: Payer: Medicare Other

## 2023-10-23 ENCOUNTER — Inpatient Hospital Stay (HOSPITAL_BASED_OUTPATIENT_CLINIC_OR_DEPARTMENT_OTHER): Payer: Medicare Other | Admitting: Internal Medicine

## 2023-10-23 VITALS — BP 102/61 | HR 90 | Temp 97.2°F | Resp 16 | Wt 165.2 lb

## 2023-10-23 DIAGNOSIS — Z79899 Other long term (current) drug therapy: Secondary | ICD-10-CM | POA: Diagnosis not present

## 2023-10-23 DIAGNOSIS — N390 Urinary tract infection, site not specified: Secondary | ICD-10-CM | POA: Diagnosis not present

## 2023-10-23 DIAGNOSIS — C3411 Malignant neoplasm of upper lobe, right bronchus or lung: Secondary | ICD-10-CM | POA: Diagnosis not present

## 2023-10-23 DIAGNOSIS — C3490 Malignant neoplasm of unspecified part of unspecified bronchus or lung: Secondary | ICD-10-CM

## 2023-10-23 DIAGNOSIS — C7931 Secondary malignant neoplasm of brain: Secondary | ICD-10-CM | POA: Diagnosis not present

## 2023-10-23 DIAGNOSIS — Z5111 Encounter for antineoplastic chemotherapy: Secondary | ICD-10-CM | POA: Diagnosis not present

## 2023-10-23 DIAGNOSIS — D649 Anemia, unspecified: Secondary | ICD-10-CM | POA: Diagnosis not present

## 2023-10-23 LAB — CBC WITH DIFFERENTIAL (CANCER CENTER ONLY)
Abs Immature Granulocytes: 0.06 10*3/uL (ref 0.00–0.07)
Basophils Absolute: 0 10*3/uL (ref 0.0–0.1)
Basophils Relative: 0 %
Eosinophils Absolute: 0 10*3/uL (ref 0.0–0.5)
Eosinophils Relative: 0 %
HCT: 36.7 % (ref 36.0–46.0)
Hemoglobin: 11.7 g/dL — ABNORMAL LOW (ref 12.0–15.0)
Immature Granulocytes: 1 %
Lymphocytes Relative: 4 %
Lymphs Abs: 0.4 10*3/uL — ABNORMAL LOW (ref 0.7–4.0)
MCH: 32.2 pg (ref 26.0–34.0)
MCHC: 31.9 g/dL (ref 30.0–36.0)
MCV: 101.1 fL — ABNORMAL HIGH (ref 80.0–100.0)
Monocytes Absolute: 0.5 10*3/uL (ref 0.1–1.0)
Monocytes Relative: 5 %
Neutro Abs: 9.2 10*3/uL — ABNORMAL HIGH (ref 1.7–7.7)
Neutrophils Relative %: 90 %
Platelet Count: 269 10*3/uL (ref 150–400)
RBC: 3.63 MIL/uL — ABNORMAL LOW (ref 3.87–5.11)
RDW: 15 % (ref 11.5–15.5)
WBC Count: 10.1 10*3/uL (ref 4.0–10.5)
nRBC: 0 % (ref 0.0–0.2)

## 2023-10-23 LAB — CMP (CANCER CENTER ONLY)
ALT: 13 U/L (ref 0–44)
AST: 13 U/L — ABNORMAL LOW (ref 15–41)
Albumin: 4.1 g/dL (ref 3.5–5.0)
Alkaline Phosphatase: 87 U/L (ref 38–126)
Anion gap: 6 (ref 5–15)
BUN: 18 mg/dL (ref 8–23)
CO2: 26 mmol/L (ref 22–32)
Calcium: 10 mg/dL (ref 8.9–10.3)
Chloride: 106 mmol/L (ref 98–111)
Creatinine: 0.95 mg/dL (ref 0.44–1.00)
GFR, Estimated: >60 mL/min (ref >60)
Glucose, Bld: 167 mg/dL — ABNORMAL HIGH (ref 70–99)
Potassium: 5 mmol/L (ref 3.5–5.1)
Sodium: 138 mmol/L (ref 135–145)
Total Bilirubin: 0.5 mg/dL (ref 0.0–1.2)
Total Protein: 7.1 g/dL (ref 6.5–8.1)

## 2023-10-23 MED ORDER — SODIUM CHLORIDE 0.9 % IV SOLN
500.0000 mg/m2 | Freq: Once | INTRAVENOUS | Status: AC
  Administered 2023-10-23: 900 mg via INTRAVENOUS
  Filled 2023-10-23: qty 20

## 2023-10-23 MED ORDER — CYANOCOBALAMIN 1000 MCG/ML IJ SOLN
1000.0000 ug | Freq: Once | INTRAMUSCULAR | Status: AC
  Administered 2023-10-23: 1000 ug via INTRAMUSCULAR
  Filled 2023-10-23: qty 1

## 2023-10-23 MED ORDER — DEXAMETHASONE SODIUM PHOSPHATE 10 MG/ML IJ SOLN
10.0000 mg | Freq: Once | INTRAMUSCULAR | Status: AC
  Administered 2023-10-23: 10 mg via INTRAVENOUS
  Filled 2023-10-23: qty 1

## 2023-10-23 MED ORDER — SODIUM CHLORIDE 0.9 % IV SOLN: Freq: Once | INTRAVENOUS | Status: AC

## 2023-10-23 NOTE — Progress Notes (Signed)
 [facility] [phone]   [phone]  OFFICE PROGRESS NOTE  [name], MD [address] [place] [address]  DIAGNOSIS: Stage IV (T2a, N0, M1b) non-small cell lung cancer, adenocarcinoma with negative EGFR and ALK mutations diagnosed in January 2015 and presented with right upper lobe lung mass in addition to a solitary brain metastasis.  PRIOR THERAPY: 1) Status post stereotactic radiotherapy to a solitary right parietal brain lesion under the care of Dr. Mitzi Hansen on 10/16/2013. 2) Status post palliative radiotherapy to the right lung tumor under the care of Dr. Mitzi Hansen completed on 12/05/2013. 3) Systemic chemotherapy with carboplatin for AUC of 5 and Alimta 500 mg/M2 every 3 weeks. First dose Jan 06 2014. Status post 6 cycles.  CURRENT THERAPY: Systemic chemotherapy with maintenance Alimta 500 MG/M2 every 3 weeks, status post 161 cycles.  INTERVAL HISTORY: Alexis [name] 73 y.o. female returns to the clinic today for follow-up visit accompanied by her husband [name]. Discussed the use of AI scribe software for clinical note transcription with the patient, who gave verbal consent to proceed.  History of Present Illness   Alexis [name] is a 73 year old female with stage four non-small cell lung cancer adenocarcinoma who presents for routine follow-up and treatment. She is accompanied by her husband, [name].  She was initially diagnosed with stage four non-small cell lung cancer adenocarcinoma in January 2015. She has been undergoing chemotherapy, starting with carboplatin and Alimta for six cycles, and then transitioning to maintenance therapy with Alimta every three weeks. She has completed 161 cycles and is here for her 162nd cycle. She feels well since her last visit three weeks ago. She is uncertain about the timing of her last scan and is awaiting confirmation on whether it is time for a repeat scan.  She recently experienced another  urinary tract infection and consulted her PA for this issue.         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  MEDICATIONS:  Current Outpatient Medications  Medication Sig Dispense Refill   acetaminophen (TYLENOL) 500 MG tablet Take 500 mg by mouth every 6 (six) hours as needed for mild pain or headache. Reported on 11/02/2015     Ascorbic Acid (VITAMIN C GUMMIE PO) Take 1 each by mouth every morning.     aspirin EC 81 MG tablet Take 1 tablet (81 mg total) by mouth daily. 150 tablet 2   Cranberry 240 MG CAPS Take 1 capsule by mouth daily. This is in her probiotic     [employer] ACID CONTROLLER 10 MG tablet SMARTSIG:1 Tablet(s) By Mouth Every 12 Hours PRN     dexamethasone (DECADRON) 4 MG tablet TAKE 1 TABLET TWICE A DAY THE DAY BEFORE, THE DAY OF, AND THE DAY AFTER CHEMOTHERAPY. 40 tablet 2   estradiol (ESTRACE) 0.1 MG/GM vaginal cream Place 1 Applicatorful vaginally daily.     folic acid (FOLVITE) 1 MG tablet TAKE 1 TABLET BY MOUTH EVERY DAY 90 tablet 0   glimepiride (AMARYL) 2 MG tablet TAKE 1  TABLET BY MOUTH DAILY BEFORE BREAKFAST. 90 tablet 0   loratadine (CLARITIN) 10 MG tablet Take 10 mg by mouth daily.     Multiple Vitamin (MULTIVITAMIN WITH MINERALS) TABS tablet Take 1 tablet by mouth every morning.     omeprazole (PRILOSEC) 20 MG capsule TAKE 1 CAPSULE BY MOUTH EVERY DAY 90 capsule 0   ondansetron (ZOFRAN) 8 MG tablet TAKE 1 TABLET BY MOUTH BEFORE CHEMO 30 tablet 1   phenazopyridine (PYRIDIUM) 200 MG tablet Take 1 tablet (200 mg total) by mouth every 8 (eight) hours as needed. 10 tablet 0   prochlorperazine  (COMPAZINE) 10 MG tablet Take 1 tablet (10 mg total) by mouth every 6 (six) hours as needed for nausea or vomiting. 60 tablet 0   rosuvastatin (CRESTOR) 10 MG tablet Take 1 tablet (10 mg total) by mouth daily. 30 tablet 12   senna-docusate (SENOKOT-S) 8.6-50 MG tablet Take 1 tablet by mouth daily. 30 tablet prn   temazepam (RESTORIL) 15 MG capsule Take 1 capsule (15 mg total) by mouth at bedtime as needed for sleep. 30 capsule 0   trimethoprim (TRIMPEX) 100 MG tablet Take 100 mg by mouth daily.     Vitamin D, Ergocalciferol, (DRISDOL) 1.25 MG (50000 UNIT) CAPS capsule Take 50,000 Units by mouth once a week. Vit d 3     No current facility-administered medications for this visit.    SURGICAL HISTORY:  Past Surgical History:  Procedure Laterality Date   ABDOMINAL HYSTERECTOMY     COLOSTOMY TAKEDOWN N/A 07/10/2014   Procedure: LAPAROSCOPIC LYSIS OF ADHESIONS (90 MIN) LAPAROSCOPIC ASSISTED COLOSTOMY CLOSURE, RIGID PROCTOSIGMOIDOSCOPY;  Surgeon: Avel Peace, MD;  Location: [facility];  Service: General;  Laterality: N/A;   LAPAROTOMY N/A 11/03/2013   Procedure: EXPLORATORY LAPAROTOMY, DRAINAGE OF INTRA  ABDOMINAL ABSCESSES, MOBILIZATION OF SPLENIC FLEXURE, SIGMOID COLECTOMY WITH COLOSTOMY;  Surgeon: Adolph Pollack, MD;  Location: [facility];  Service: General;  Laterality: N/A;   VIDEO BRONCHOSCOPY Bilateral 08/30/2013   Procedure: VIDEO BRONCHOSCOPY WITH FLUORO;  Surgeon: Nyoka Cowden, MD;  Location: [facility];  Service: Cardiopulmonary;  Laterality: Bilateral;    REVIEW OF SYSTEMS:  A comprehensive review of systems was negative except for: Constitutional: positive for fatigue   PHYSICAL EXAMINATION: General appearance: alert, cooperative, and no distress Head: Normocephalic, without obvious abnormality, atraumatic Neck: no adenopathy, no JVD, supple, symmetrical, trachea midline, and thyroid not enlarged, symmetric, no tenderness/mass/nodules Lymph nodes: Cervical, supraclavicular, and  axillary nodes normal. Resp: clear to auscultation bilaterally Back: symmetric, no curvature. ROM normal. No CVA tenderness. Cardio: regular rate and rhythm, S1, S2 normal, no murmur, click, rub or gallop GI: soft, non-tender; bowel sounds normal; no masses,  no organomegaly Extremities: extremities normal, atraumatic, no cyanosis or edema   ECOG PERFORMANCE STATUS: 1 - Symptomatic but completely ambulatory   Blood pressure 102/61, pulse 90, temperature (!) 97.2 F (36.2 C), temperature source Temporal, resp. rate 16, weight 165 lb 3.2 oz (74.9 kg), SpO2 100%.  LABORATORY DATA: Lab Results  Component Value Date   WBC 10.1 10/23/2023   HGB 11.7 (L) 10/23/2023   HCT 36.7 10/23/2023   MCV 101.1 (H) 10/23/2023   PLT 269 10/23/2023      Chemistry      Component Value Date/Time   NA 138 10/02/2023 0936   NA 140 11/03/2022 0844   NA 139 08/14/2017 0837   K 3.8 10/02/2023 0936   K 4.4 08/14/2017 0837   CL 107 10/02/2023 0936   CO2 22 10/02/2023  0936   CO2 24 08/14/2017 0837   BUN 19 10/02/2023 0936   BUN 33 (H) 11/03/2022 0844   BUN 13.3 08/14/2017 0837   CREATININE 0.82 10/02/2023 0936   CREATININE 0.8 08/14/2017 0837      Component Value Date/Time   CALCIUM 9.4 10/02/2023 0936   CALCIUM 9.3 08/14/2017 0837   ALKPHOS 90 10/02/2023 0936   ALKPHOS 107 08/14/2017 0837   AST 10 (L) 10/02/2023 0936   AST 12 08/14/2017 0837   ALT 9 10/02/2023 0936   ALT 12 08/14/2017 0837   BILITOT 0.4 10/02/2023 0936   BILITOT 0.37 08/14/2017 0837       RADIOGRAPHIC STUDIES: No results found.   ASSESSMENT AND PLAN:  This is a very pleasant 73 years old white female with metastatic non-small cell lung cancer, adenocarcinoma status post induction systemic chemotherapy with carboplatin and Alimta with partial response.  The patient has no actionable EGFR or ALK mutations at the time of her diagnosis. The patient is currently on maintenance treatment with single agent Alimta status post  161 cycles. The patient has been tolerating this treatment well with no concerning adverse effects.    Stage IV Non-Small Cell Lung Cancer (NSCLC) Adenocarcinoma Stage IV NSCLC adenocarcinoma diagnosed in January 2015. She has completed six cycles of chemotherapy with carboplatin and Alimta, followed by maintenance therapy with Alimta every three weeks. Today marks the 162nd cycle of Alimta. She reports feeling well. Blood work is satisfactory for treatment continuation. Discussed the importance of continuing the current regimen given the positive response and well-managed condition. - Administer 162nd cycle of Alimta - Repeat CT scan in 2 weeks - Follow-up in three weeks  Urinary Tract Infection (UTI) Recent UTI with follow-up by her PA. Scheduled to see Dr. Chauncy Lean in June for further evaluation, including a bladder check. - Follow-up with Dr. Chauncy Lean in June for bladder evaluation. (Dr. Annabell Howells) For the anemia, the patient will continue on the oral iron tablets. She was advised to call immediately if she has any concerning symptoms in the interval.  The patient voices understanding of current disease status and treatment options and is in agreement with the current care plan. All questions were answered. The patient knows to call the clinic with any problems, questions or concerns. We can certainly see the patient much sooner if necessary.  Disclaimer: This note was dictated with voice recognition software. Similar sounding words can inadvertently be transcribed and may not be corrected upon review.

## 2023-10-24 DIAGNOSIS — Z961 Presence of intraocular lens: Secondary | ICD-10-CM | POA: Diagnosis not present

## 2023-10-24 DIAGNOSIS — H524 Presbyopia: Secondary | ICD-10-CM | POA: Diagnosis not present

## 2023-10-24 DIAGNOSIS — H21262 Iris atrophy (essential) (progressive), left eye: Secondary | ICD-10-CM | POA: Diagnosis not present

## 2023-10-24 DIAGNOSIS — E119 Type 2 diabetes mellitus without complications: Secondary | ICD-10-CM | POA: Diagnosis not present

## 2023-11-06 ENCOUNTER — Encounter (HOSPITAL_COMMUNITY): Payer: Self-pay

## 2023-11-06 ENCOUNTER — Ambulatory Visit (HOSPITAL_COMMUNITY)
Admission: RE | Admit: 2023-11-06 | Discharge: 2023-11-06 | Disposition: A | Discharge status: Home / Self Care | Payer: Medicare Other | Source: Ambulatory Visit | Attending: Internal Medicine | Admitting: Internal Medicine

## 2023-11-06 DIAGNOSIS — C3490 Malignant neoplasm of unspecified part of unspecified bronchus or lung: Secondary | ICD-10-CM | POA: Insufficient documentation

## 2023-11-06 MED ORDER — IOHEXOL 300 MG/ML  SOLN
100.0000 mL | Freq: Once | INTRAMUSCULAR | Status: AC | PRN
  Administered 2023-11-06: 100 mL via INTRAVENOUS

## 2023-11-13 ENCOUNTER — Inpatient Hospital Stay: Payer: Medicare Other

## 2023-11-13 ENCOUNTER — Inpatient Hospital Stay (HOSPITAL_BASED_OUTPATIENT_CLINIC_OR_DEPARTMENT_OTHER): Payer: Medicare Other | Admitting: Internal Medicine

## 2023-11-13 ENCOUNTER — Inpatient Hospital Stay: Payer: Medicare Other | Attending: Internal Medicine

## 2023-11-13 DIAGNOSIS — C3411 Malignant neoplasm of upper lobe, right bronchus or lung: Secondary | ICD-10-CM | POA: Diagnosis not present

## 2023-11-13 DIAGNOSIS — G4700 Insomnia, unspecified: Secondary | ICD-10-CM | POA: Insufficient documentation

## 2023-11-13 DIAGNOSIS — Z7982 Long term (current) use of aspirin: Secondary | ICD-10-CM | POA: Diagnosis not present

## 2023-11-13 DIAGNOSIS — Z8541 Personal history of malignant neoplasm of cervix uteri: Secondary | ICD-10-CM | POA: Diagnosis not present

## 2023-11-13 DIAGNOSIS — Z79899 Other long term (current) drug therapy: Secondary | ICD-10-CM | POA: Insufficient documentation

## 2023-11-13 DIAGNOSIS — Z5111 Encounter for antineoplastic chemotherapy: Secondary | ICD-10-CM | POA: Insufficient documentation

## 2023-11-13 DIAGNOSIS — E119 Type 2 diabetes mellitus without complications: Secondary | ICD-10-CM | POA: Insufficient documentation

## 2023-11-13 DIAGNOSIS — C7931 Secondary malignant neoplasm of brain: Secondary | ICD-10-CM | POA: Insufficient documentation

## 2023-11-13 DIAGNOSIS — Z923 Personal history of irradiation: Secondary | ICD-10-CM | POA: Diagnosis not present

## 2023-11-13 LAB — CBC WITH DIFFERENTIAL (CANCER CENTER ONLY)
Abs Immature Granulocytes: 0.05 10*3/uL (ref 0.00–0.07)
Basophils Absolute: 0 10*3/uL (ref 0.0–0.1)
Basophils Relative: 0 %
Eosinophils Absolute: 0 10*3/uL (ref 0.0–0.5)
Eosinophils Relative: 0 %
HCT: 36.4 % (ref 36.0–46.0)
Hemoglobin: 11.6 g/dL — ABNORMAL LOW (ref 12.0–15.0)
Immature Granulocytes: 1 %
Lymphocytes Relative: 5 %
Lymphs Abs: 0.5 10*3/uL — ABNORMAL LOW (ref 0.7–4.0)
MCH: 32.3 pg (ref 26.0–34.0)
MCHC: 31.9 g/dL (ref 30.0–36.0)
MCV: 101.4 fL — ABNORMAL HIGH (ref 80.0–100.0)
Monocytes Absolute: 0.8 10*3/uL (ref 0.1–1.0)
Monocytes Relative: 8 %
Neutro Abs: 9.2 10*3/uL — ABNORMAL HIGH (ref 1.7–7.7)
Neutrophils Relative %: 86 %
Platelet Count: 298 10*3/uL (ref 150–400)
RBC: 3.59 MIL/uL — ABNORMAL LOW (ref 3.87–5.11)
RDW: 15.6 % — ABNORMAL HIGH (ref 11.5–15.5)
WBC Count: 10.6 10*3/uL — ABNORMAL HIGH (ref 4.0–10.5)
nRBC: 0 % (ref 0.0–0.2)

## 2023-11-13 LAB — CMP (CANCER CENTER ONLY)
ALT: 11 U/L (ref 0–44)
AST: 10 U/L — ABNORMAL LOW (ref 15–41)
Albumin: 4 g/dL (ref 3.5–5.0)
Alkaline Phosphatase: 91 U/L (ref 38–126)
Anion gap: 7 (ref 5–15)
BUN: 19 mg/dL (ref 8–23)
CO2: 25 mmol/L (ref 22–32)
Calcium: 9.3 mg/dL (ref 8.9–10.3)
Chloride: 105 mmol/L (ref 98–111)
Creatinine: 0.87 mg/dL (ref 0.44–1.00)
GFR, Estimated: >60 mL/min (ref >60)
Glucose, Bld: 168 mg/dL — ABNORMAL HIGH (ref 70–99)
Potassium: 4.3 mmol/L (ref 3.5–5.1)
Sodium: 137 mmol/L (ref 135–145)
Total Bilirubin: 0.4 mg/dL (ref 0.0–1.2)
Total Protein: 6.8 g/dL (ref 6.5–8.1)

## 2023-11-13 MED ORDER — SODIUM CHLORIDE 0.9 % IV SOLN
500.0000 mg/m2 | Freq: Once | INTRAVENOUS | Status: AC
  Administered 2023-11-13: 900 mg via INTRAVENOUS
  Filled 2023-11-13: qty 20

## 2023-11-13 MED ORDER — CYANOCOBALAMIN 1000 MCG/ML IJ SOLN
1000.0000 ug | Freq: Once | INTRAMUSCULAR | Status: DC
Start: 2023-11-13 — End: 2023-11-13

## 2023-11-13 MED ORDER — SODIUM CHLORIDE 0.9 % IV SOLN
Freq: Once | INTRAVENOUS | Status: AC
Start: 2023-11-13 — End: 2023-11-13

## 2023-11-13 MED ORDER — DEXAMETHASONE SODIUM PHOSPHATE 10 MG/ML IJ SOLN
10.0000 mg | Freq: Once | INTRAMUSCULAR | Status: AC
  Administered 2023-11-13: 10 mg via INTRAVENOUS
  Filled 2023-11-13: qty 1

## 2023-11-13 MED ORDER — TEMAZEPAM 15 MG PO CAPS: 15.0000 mg | ORAL_CAPSULE | Freq: Every evening | ORAL | 0 refills | Status: DC | PRN

## 2023-11-13 NOTE — Progress Notes (Signed)
 [facility] [phone]   [phone]  OFFICE PROGRESS NOTE  [name], MD [address] [place] [address]  DIAGNOSIS: Stage IV (T2a, N0, M1b) non-small cell lung cancer, adenocarcinoma with negative EGFR and ALK mutations diagnosed in January 2015 and presented with right upper lobe lung mass in addition to a solitary brain metastasis.  PRIOR THERAPY: 1) Status post stereotactic radiotherapy to a solitary right parietal brain lesion under the care of Dr. Mitzi Hansen on 10/16/2013. 2) Status post palliative radiotherapy to the right lung tumor under the care of Dr. Mitzi Hansen completed on 12/05/2013. 3) Systemic chemotherapy with carboplatin for AUC of 5 and Alimta 500 mg/M2 every 3 weeks. First dose Jan 06 2014. Status post 6 cycles.  CURRENT THERAPY: Systemic chemotherapy with maintenance Alimta 500 MG/M2 every 3 weeks, status post 162 cycles.  INTERVAL HISTORY: Alexis [name] 73 y.o. female returns to the clinic today for follow-up visit accompanied by her husband Alexis [name]. Discussed the use of AI scribe software for clinical note transcription with the patient, who gave verbal consent to proceed.  History of Present Illness   Alexis [name] is a 73 year old female with stage four non-small cell lung cancer who presents for routine follow-up and treatment. She is accompanied by her husband, Alexis [name].  She was diagnosed with stage four non-small cell lung cancer in January 2015. Initially, she received chemotherapy with carboplatin and Alimta for six cycles, and has since been on maintenance therapy with Alimta alone every three weeks. She has completed 162 cycles of Alimta to date. No new complaints have arisen since her last visit three weeks ago. No chest pain, breathing issues, nausea, vomiting, or diarrhea.  A recent scan performed last week shows no evidence of recurrent or metastatic carcinoma within the chest, abdomen, or pelvis. She  expresses relief and gratitude for the results.  She experiences ongoing sleep disturbances, waking up at 1:30 AM and getting up to avoid disturbing her husband. She was previously prescribed Restoril 15 mg, which helped her sleep, but she has run out and requests a refill.  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  MEDICATIONS:  Current Outpatient Medications  Medication Sig Dispense Refill   acetaminophen (TYLENOL) 500 MG tablet Take 500 mg by mouth every 6 (six) hours as needed for mild pain or headache. Reported on 11/02/2015     Ascorbic Acid (VITAMIN C GUMMIE PO) Take 1 each by mouth every morning.     aspirin EC 81 MG tablet Take 1 tablet (81 mg total) by mouth daily. 150 tablet 2   Cranberry 240 MG CAPS Take 1 capsule by mouth daily. This is in her probiotic     [employer] ACID CONTROLLER 10 MG tablet SMARTSIG:1 Tablet(s) By Mouth Every 12 Hours PRN     dexamethasone (DECADRON)  4 MG tablet TAKE 1 TABLET TWICE A DAY THE DAY BEFORE, THE DAY OF, AND THE DAY AFTER CHEMOTHERAPY. 40 tablet 2   estradiol (ESTRACE) 0.1 MG/GM vaginal cream Place 1 Applicatorful vaginally daily.     folic acid (FOLVITE) 1 MG tablet TAKE 1 TABLET BY MOUTH EVERY DAY 90 tablet 0   glimepiride (AMARYL) 2 MG tablet TAKE 1 TABLET BY MOUTH DAILY BEFORE BREAKFAST. 90 tablet 0   loratadine (CLARITIN) 10 MG tablet Take 10 mg by mouth daily.     Multiple Vitamin (MULTIVITAMIN  WITH MINERALS) TABS tablet Take 1 tablet by mouth every morning.     omeprazole (PRILOSEC) 20 MG capsule TAKE 1 CAPSULE BY MOUTH EVERY DAY 90 capsule 0   ondansetron (ZOFRAN) 8 MG tablet TAKE 1 TABLET BY MOUTH BEFORE CHEMO 30 tablet 1   phenazopyridine (PYRIDIUM) 200 MG tablet Take 1 tablet (200 mg total) by mouth every 8 (eight) hours as needed. 10 tablet 0   prochlorperazine (COMPAZINE) 10 MG tablet Take 1 tablet (10 mg total) by mouth every 6 (six) hours as needed for nausea or vomiting. 60 tablet 0   rosuvastatin (CRESTOR) 10 MG tablet Take 1 tablet (10 mg total) by mouth daily. 30 tablet 12   senna-docusate (SENOKOT-S) 8.6-50 MG tablet Take 1 tablet by mouth daily. 30 tablet prn   temazepam (RESTORIL) 15 MG capsule Take 1 capsule (15 mg total) by mouth at bedtime as needed for sleep. 30 capsule 0   trimethoprim (TRIMPEX) 100 MG tablet Take 100 mg by mouth daily.     Vitamin D, Ergocalciferol, (DRISDOL) 1.25 MG (50000 UNIT) CAPS capsule Take 50,000 Units by mouth once a week. Vit d 3     No current facility-administered medications for this visit.    SURGICAL HISTORY:  Past Surgical History:  Procedure Laterality Date   ABDOMINAL HYSTERECTOMY     COLOSTOMY TAKEDOWN N/A 07/10/2014   Procedure: LAPAROSCOPIC LYSIS OF ADHESIONS (90 MIN) LAPAROSCOPIC ASSISTED COLOSTOMY CLOSURE, RIGID PROCTOSIGMOIDOSCOPY;  Surgeon: Avel Peace, MD;  Location: [facility];  Service: General;  Laterality: N/A;   LAPAROTOMY N/A 11/03/2013   Procedure: EXPLORATORY LAPAROTOMY, DRAINAGE OF INTRA  ABDOMINAL ABSCESSES, MOBILIZATION OF SPLENIC FLEXURE, SIGMOID COLECTOMY WITH COLOSTOMY;  Surgeon: Adolph Pollack, MD;  Location: [facility];  Service: General;  Laterality: N/A;   VIDEO BRONCHOSCOPY Bilateral 08/30/2013   Procedure: VIDEO BRONCHOSCOPY WITH FLUORO;  Surgeon: Nyoka Cowden, MD;  Location: [facility];  Service: Cardiopulmonary;  Laterality: Bilateral;    REVIEW OF SYSTEMS:  Constitutional: positive for  fatigue Eyes: negative Ears, nose, mouth, throat, and face: negative Respiratory: negative Cardiovascular: negative Gastrointestinal: negative Genitourinary:negative Integument/breast: negative Hematologic/lymphatic: negative Musculoskeletal:negative Neurological: negative Behavioral/Psych: positive for sleep disturbance Endocrine: negative Allergic/Immunologic: negative   PHYSICAL EXAMINATION: General appearance: alert, cooperative, and no distress Head: Normocephalic, without obvious abnormality, atraumatic Neck: no adenopathy, no JVD, supple, symmetrical, trachea midline, and thyroid not enlarged, symmetric, no tenderness/mass/nodules Lymph nodes: Cervical, supraclavicular, and axillary nodes normal. Resp: clear to auscultation bilaterally Back: symmetric, no curvature. ROM normal. No CVA tenderness. Cardio: regular rate and rhythm, S1, S2 normal, no murmur, click, rub or gallop GI: soft, non-tender; bowel sounds normal; no masses,  no organomegaly Extremities: extremities normal, atraumatic, no cyanosis or edema Neurologic: Alert and oriented X 3, normal strength and tone. Normal symmetric reflexes. Normal coordination and gait   ECOG PERFORMANCE STATUS: 1 - Symptomatic but completely ambulatory   Blood pressure 113/63, pulse 81, temperature 98 F (36.7 C), temperature source  Temporal, resp. rate 17, height 5\' 4"  (1.626 m), weight 166 lb 4.8 oz (75.4 kg), SpO2 100%.  LABORATORY DATA: Lab Results  Component Value Date   WBC 10.6 (H) 11/13/2023   HGB 11.6 (L) 11/13/2023   HCT 36.4 11/13/2023   MCV 101.4 (H) 11/13/2023   PLT 298 11/13/2023      Chemistry      Component Value Date/Time   NA 137 11/13/2023 0858   NA 140 11/03/2022 0844   NA 139 08/14/2017 0837   K 4.3 11/13/2023 0858   K 4.4 08/14/2017 0837   CL 105 11/13/2023 0858   CO2 25 11/13/2023 0858   CO2 24 08/14/2017 0837   BUN 19 11/13/2023 0858   BUN 33 (H) 11/03/2022 0844   BUN 13.3 08/14/2017 0837    CREATININE 0.87 11/13/2023 0858   CREATININE 0.8 08/14/2017 0837      Component Value Date/Time   CALCIUM 9.3 11/13/2023 0858   CALCIUM 9.3 08/14/2017 0837   ALKPHOS 91 11/13/2023 0858   ALKPHOS 107 08/14/2017 0837   AST 10 (L) 11/13/2023 0858   AST 12 08/14/2017 0837   ALT 11 11/13/2023 0858   ALT 12 08/14/2017 0837   BILITOT 0.4 11/13/2023 0858   BILITOT 0.37 08/14/2017 0837       RADIOGRAPHIC STUDIES: CT Chest W Contrast Result Date: 11/13/2023 CLINICAL DATA:  Follow-up non-small cell lung carcinoma. Previous radiation therapy. Chemotherapy in progress. * Tracking Code: BO * EXAM: CT CHEST, ABDOMEN, AND PELVIS WITH CONTRAST TECHNIQUE: Multidetector CT imaging of the chest, abdomen and pelvis was performed following the standard protocol during bolus administration of intravenous contrast. RADIATION DOSE REDUCTION: This exam was performed according to the departmental dose-optimization program which includes automated exposure control, adjustment of the mA and/or kV according to patient size and/or use of iterative reconstruction technique. CONTRAST:  OMNIPAQUE IOHEXOL 300 MG/ML  SOLN COMPARISON:  08/14/2023 FINDINGS: CT CHEST FINDINGS Cardiovascular: No acute findings. Mediastinum/Lymph Nodes: No masses or pathologically enlarged lymph nodes identified. Lungs/Pleura: Stable post radiation changes are seen in the medial right upper lobe and right lung apex. Pleural-parenchymal scarring in left lung apex is also stable. No suspicious pulmonary nodules or masses identified. No evidence of infiltrate or pleural effusion. Musculoskeletal:  No suspicious bone lesions identified. CT ABDOMEN AND PELVIS FINDINGS Hepatobiliary: No masses identified. Gallbladder is unremarkable. No evidence of biliary ductal dilatation. Pancreas:  No mass or inflammatory changes. Spleen:  Within normal limits in size and appearance. Adrenals/Urinary tract: No suspicious masses or hydronephrosis. Stomach/Bowel:  Stable rectosigmoid surgical anastomosis. No evidence of obstruction, inflammatory process, or abnormal fluid collections. Vascular/Lymphatic: No pathologically enlarged lymph nodes identified. No acute vascular findings. Reproductive: Prior hysterectomy noted. Adnexal regions are unremarkable in appearance. Other:  None. Musculoskeletal:  No suspicious bone lesions identified. IMPRESSION: Stable post radiation changes in right upper lobe. No evidence of recurrent or metastatic carcinoma within the chest, abdomen, or pelvis. Electronically Signed   By: Danae Orleans M.D.   On: 11/13/2023 09:55   CT ABDOMEN PELVIS W CONTRAST Result Date: 11/13/2023 CLINICAL DATA:  Follow-up non-small cell lung carcinoma. Previous radiation therapy. Chemotherapy in progress. * Tracking Code: BO * EXAM: CT CHEST, ABDOMEN, AND PELVIS WITH CONTRAST TECHNIQUE: Multidetector CT imaging of the chest, abdomen and pelvis was performed following the standard protocol during bolus administration of intravenous contrast. RADIATION DOSE REDUCTION: This exam was performed according to the departmental dose-optimization program which includes automated exposure control, adjustment of the mA and/or kV  according to patient size and/or use of iterative reconstruction technique. CONTRAST:  OMNIPAQUE IOHEXOL 300 MG/ML  SOLN COMPARISON:  08/14/2023 FINDINGS: CT CHEST FINDINGS Cardiovascular: No acute findings. Mediastinum/Lymph Nodes: No masses or pathologically enlarged lymph nodes identified. Lungs/Pleura: Stable post radiation changes are seen in the medial right upper lobe and right lung apex. Pleural-parenchymal scarring in left lung apex is also stable. No suspicious pulmonary nodules or masses identified. No evidence of infiltrate or pleural effusion. Musculoskeletal:  No suspicious bone lesions identified. CT ABDOMEN AND PELVIS FINDINGS Hepatobiliary: No masses identified. Gallbladder is unremarkable. No evidence of biliary ductal  dilatation. Pancreas:  No mass or inflammatory changes. Spleen:  Within normal limits in size and appearance. Adrenals/Urinary tract: No suspicious masses or hydronephrosis. Stomach/Bowel: Stable rectosigmoid surgical anastomosis. No evidence of obstruction, inflammatory process, or abnormal fluid collections. Vascular/Lymphatic: No pathologically enlarged lymph nodes identified. No acute vascular findings. Reproductive: Prior hysterectomy noted. Adnexal regions are unremarkable in appearance. Other:  None. Musculoskeletal:  No suspicious bone lesions identified. IMPRESSION: Stable post radiation changes in right upper lobe. No evidence of recurrent or metastatic carcinoma within the chest, abdomen, or pelvis. Electronically Signed   By: Danae Orleans M.D.   On: 11/13/2023 09:55     ASSESSMENT AND PLAN:  This is a very pleasant 72 years old white female with metastatic non-small cell lung cancer, adenocarcinoma status post induction systemic chemotherapy with carboplatin and Alimta with partial response.  The patient has no actionable EGFR or ALK mutations at the time of her diagnosis. The patient is currently on maintenance treatment with single agent Alimta status post 162 cycles. The patient continues to tolerate this treatment fairly well with no concerning adverse effects. She had repeat CT scan of the chest, abdomen and pelvis performed recently.  I personally and independently reviewed the scan and discussed the result with the patient and her husband.  Her scan showed n concerning finding for recurrent or metastatic carcinoma in the chest, abdomen and pelvis. I recommended for her to continue her current treatment with Alimta and she will proceed with cycle #163 today. Assessment and Plan    Stage IV non-small cell lung cancer Diagnosed in January 2015 with stage IV non-small cell lung cancer. Currently on maintenance therapy with Alimta every three weeks, having completed 162 cycles. Recent  imaging shows no evidence of recurrent or metastatic carcinoma within the chest, abdomen, or pelvis, indicating effective disease control. She reports no new symptoms such as chest pain, dyspnea, nausea, vomiting, or diarrhea. Lab work supports continued treatment. She has been on Alimta monotherapy for over ten years, demonstrating excellent disease control and tolerance. - Proceed with Alimta treatment today - Schedule next cycle in three weeks  Insomnia Experiencing difficulty sleeping, waking at 1:30 AM and unable to return to sleep. Previously prescribed Restoril 15 mg, which was effective in improving sleep. - Refill Restoril 15 mg prescription  Diabetes Reports diabetes, but recent ophthalmologic examination showed no diabetic retinopathy, indicating good glycemic control.    For the anemia, the patient will continue on the oral iron tablets. The patient was advised to call immediately if she has any other concerning symptoms in the interval.  The patient voices understanding of current disease status and treatment options and is in agreement with the current care plan. All questions were answered. The patient knows to call the clinic with any problems, questions or concerns. We can certainly see the patient much sooner if necessary.  Disclaimer: This note was dictated  with voice recognition software. Similar sounding words can inadvertently be transcribed and may not be corrected upon review.

## 2023-11-16 ENCOUNTER — Ambulatory Visit: Payer: Medicare Other | Admitting: Family Medicine

## 2023-11-16 ENCOUNTER — Encounter: Payer: Self-pay | Admitting: Family Medicine

## 2023-11-16 VITALS — BP 122/77 | HR 77 | Ht 64.0 in | Wt 169.0 lb

## 2023-11-16 DIAGNOSIS — Z20828 Contact with and (suspected) exposure to other viral communicable diseases: Secondary | ICD-10-CM | POA: Diagnosis not present

## 2023-11-16 DIAGNOSIS — E119 Type 2 diabetes mellitus without complications: Secondary | ICD-10-CM | POA: Diagnosis not present

## 2023-11-16 DIAGNOSIS — G4700 Insomnia, unspecified: Secondary | ICD-10-CM | POA: Diagnosis not present

## 2023-11-16 LAB — POCT GLYCOSYLATED HEMOGLOBIN (HGB A1C): HbA1c POC (<> result, manual entry): 6.8 % (ref 4.0–5.6)

## 2023-11-16 MED ORDER — OSELTAMIVIR PHOSPHATE 75 MG PO CAPS
75.0000 mg | ORAL_CAPSULE | Freq: Two times a day (BID) | ORAL | 0 refills | Status: AC
Start: 2023-11-16 — End: 2023-11-21

## 2023-11-16 NOTE — Progress Notes (Unsigned)
 New Patient Office Visit  Subjective   Patient ID: Alexis [name], female    DOB: 04-Jun-1951  Age: 73 y.o. MRN: [id number]  CC:  Chief Complaint  Patient presents with   Medical Management of Chronic Issues   re establish care    HPI Alexis [name] presents to establish care Patient here to reestablish care with [place].  She is currently receiving chemotherapy for non-small cell lung cancer.  The patient feels "a little weak" from the chemotherapy but overall tolerating it well.  She is happy with the recent scans.    Patient also takes trimethoprim, folic acid, dexamethasone 3 days every 3 weeks, temazepam, omeprazole, aspirin, Senokot as needed.  Patient also sees Dr. Annabell Howells, urology, for recurrent UTIs.  Her eye doctor is Dr. Anner Crete in [place].  She recently had cataract surgery.  Patient no longer has an ostomy.   PMH: stage IV NSCLC, diabetes, copd  PSH: hysterectomy, laparotomy, cataract surgery.    FH:   Tobacco use: former Alcohol use: no Drug use: no Marital status: married Employment: retired    Outpatient Encounter Medications as of 11/16/2023  Medication Sig   acetaminophen (TYLENOL) 500 MG tablet Take 500 mg by mouth every 6 (six) hours as needed for mild pain or headache. Reported on 11/02/2015   Ascorbic Acid (VITAMIN C GUMMIE PO) Take 1 each by mouth every morning.   aspirin EC 81 MG tablet Take 1 tablet (81 mg total) by mouth daily.   Cranberry 240 MG CAPS Take 1 capsule by mouth daily. This is in her probiotic   [employer] ACID CONTROLLER 10 MG tablet SMARTSIG:1 Tablet(s) By Mouth Every 12 Hours PRN   dexamethasone (DECADRON) 4 MG tablet TAKE 1 TABLET TWICE A DAY THE DAY BEFORE, THE DAY OF, AND THE DAY AFTER CHEMOTHERAPY.   estradiol (ESTRACE) 0.1 MG/GM vaginal cream Place 1 Applicatorful vaginally daily.   folic acid (FOLVITE) 1 MG tablet TAKE 1 TABLET BY MOUTH EVERY DAY   glimepiride (AMARYL) 2 MG tablet TAKE 1 TABLET BY MOUTH DAILY BEFORE BREAKFAST.    loratadine (CLARITIN) 10 MG tablet Take 10 mg by mouth daily.   Multiple Vitamin (MULTIVITAMIN WITH MINERALS) TABS tablet Take 1 tablet by mouth every morning.   omeprazole (PRILOSEC) 20 MG capsule TAKE 1 CAPSULE BY MOUTH EVERY DAY   ondansetron (ZOFRAN) 8 MG tablet TAKE 1 TABLET BY MOUTH BEFORE CHEMO   oseltamivir (TAMIFLU) 75 MG capsule Take 1 capsule (75 mg total) by mouth 2 (two) times daily for 5 days.   phenazopyridine (PYRIDIUM) 200 MG tablet Take 1 tablet (200 mg total) by mouth every 8 (eight) hours as needed.   prochlorperazine (COMPAZINE) 10 MG tablet Take 1 tablet (10 mg total) by mouth every 6 (six) hours as needed for nausea or vomiting.   rosuvastatin (CRESTOR) 10 MG tablet Take 1 tablet (10 mg total) by mouth daily.   senna-docusate (SENOKOT-S) 8.6-50 MG tablet Take 1 tablet by mouth daily.   temazepam (RESTORIL) 15 MG capsule Take 1 capsule (15 mg total) by mouth at bedtime as needed for sleep.   trimethoprim (TRIMPEX) 100 MG tablet Take 100 mg by mouth daily.   Vitamin D, Ergocalciferol, (DRISDOL) 1.25 MG (50000 UNIT) CAPS capsule Take 50,000 Units by mouth once a week. Vit d 3   No facility-administered encounter medications on file as of 11/16/2023.    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    Past Surgical History:  Procedure Laterality Date   ABDOMINAL HYSTERECTOMY     COLOSTOMY TAKEDOWN N/A 07/10/2014   Procedure: LAPAROSCOPIC LYSIS OF ADHESIONS (90 MIN) LAPAROSCOPIC ASSISTED COLOSTOMY CLOSURE, RIGID PROCTOSIGMOIDOSCOPY;  Surgeon: Avel Peace, MD;  Location: [facility];  Service: General;  Laterality: N/A;   LAPAROTOMY N/A  11/03/2013   Procedure: EXPLORATORY LAPAROTOMY, DRAINAGE OF INTRA  ABDOMINAL ABSCESSES, MOBILIZATION OF SPLENIC FLEXURE, SIGMOID COLECTOMY WITH COLOSTOMY;  Surgeon: Adolph Pollack, MD;  Location: [facility];  Service: General;  Laterality: N/A;   VIDEO BRONCHOSCOPY Bilateral 08/30/2013   Procedure: VIDEO BRONCHOSCOPY WITH FLUORO;  Surgeon: Nyoka Cowden, MD;  Location: [facility];  Service: Cardiopulmonary;  Laterality: Bilateral;    Family History  Problem Relation Age of Onset   Emphysema Father        smoked   Lung cancer Father        smoked   Cancer Mother    Hypertension Mother    COPD Mother     Social History   Socioeconomic History   Marital status: Married    Spouse name: Not on file   Number of children: Not on file   Years of education: Not on file   Highest education level: Not on file  Occupational History   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  Tobacco Use   Smoking status: Former    Current packs/day: 0.00    Average packs/day: 1 pack/day for 40.0 years (40.0 ttl pk-yrs)    Types: Cigarettes    Start date: 09/27/1973    Quit date: 09/27/2013    Years since quitting: 10.1   Smokeless tobacco: Never  Vaping Use   Vaping status: Never Used  Substance and Sexual Activity   Alcohol use: No   Drug use: No   Sexual activity: Yes  Other Topics Concern   Not on file  Social History Narrative   Not on file   Social Drivers of Health   Financial Resource Strain: Low Risk  (11/10/2022)   Overall Financial Resource Strain (CARDIA)    Difficulty of Paying Living Expenses: Not hard at all  Food Insecurity: No Food Insecurity (07/31/2023)   Hunger Vital Sign    Worried About Running Out of Food in the Last Year: Never true    Ran Out of Food in the Last Year: Never true  Transportation Needs: No Transportation Needs (07/31/2023)   PRAPARE - Administrator, Civil Service (Medical): No    Lack of Transportation (Non-Medical): No  Physical  Activity: Insufficiently Active (11/10/2022)   Exercise Vital Sign    Days of Exercise per Week: 1 day    Minutes of Exercise per Session: 20 min  Stress: Stress Concern Present (11/10/2022)   [employer] of Occupational Health - Occupational Stress Questionnaire    Feeling of Stress : To some extent  Social Connections: Moderately Isolated (11/10/2022)   Social Connection and Isolation Panel [NHANES]    Frequency of Communication with Friends and Family: Once a week    Frequency of Social Gatherings with Friends and Family: Once a week    Attends Religious Services: More than 4 times per year  Active Member of Clubs or Organizations: No    Attends Banker Meetings: Never    Marital Status: Married  Catering manager Violence: Not At Risk (07/31/2023)   Humiliation, Afraid, Rape, and Kick questionnaire    Fear of Current or Ex-Partner: No    Emotionally Abused: No    Physically Abused: No    Sexually Abused: No    ROS     Objective   BP 122/77   Pulse 77   Ht 5\' 4"  (1.626 m)   Wt 169 lb (76.7 kg)   SpO2 100%   BMI 29.01 kg/m   Physical Exam General: Alert, oriented CV: Regular rate rhythm Pulmonary: Lungs clear bilaterally GI: Soft, normal bowel sounds Extremities: No pedal edema MSK: Normal gait, strength equal bilaterally Psych: Pleasant      Assessment & Plan:   Diabetes mellitus without complication (HCC) Assessment & Plan: Patient now has 2 A1c's greater than 6.5.  Most recent one 6.8.  We discussed her diagnosis of diabetes.  She did not tolerate metformin in the past.  Glipizide caused hypoglycemia.  She is not a candidate for SGLT2's due to her frequent UTIs.  Because her A1c is less than 7 we will manage with diet.  We will follow-up in 3 months.  Will need foot exam, UACR.  Per the patient she had a recent diabetic retinopathy exam with her optometrist Dr. Anner Crete.  Orders: -     POCT glycosylated hemoglobin (Hb A1C)  Insomnia,  unspecified type Assessment & Plan: Patient has occasional episodes with difficulty sleeping for which she has recently been prescribed temazepam.  Prior to that in the past she has been on Xanax.  Continue to fill temazepam as needed.  Does not use every night.   Exposure to the flu Assessment & Plan: The patient was accompanied by her husband today.  He is also a patient of mine.  The patient's husband complained of cough starting last night.  Because of the patient's chemotherapy treatments I offered to test him for COVID and flu.  Husband tested positive for flu B.  I prescribed him and the patient both 5 days of Tamiflu twice daily, and for treatment and her for prophylaxis.  Prior this years CDC guidelines, Tamiflu prophylaxis is twice a day.   Other orders -     Oseltamivir Phosphate; Take 1 capsule (75 mg total) by mouth 2 (two) times daily for 5 days.  Dispense: 10 capsule; Refill: 0    Return in about 3 months (around 02/16/2024) for DM.   Sandre Kitty, MD

## 2023-11-16 NOTE — Assessment & Plan Note (Signed)
 Patient now has 2 A1c's greater than 6.5.  Most recent one 6.8.  We discussed her diagnosis of diabetes.  She did not tolerate metformin in the past.  Glipizide caused hypoglycemia.  She is not a candidate for SGLT2's due to her frequent UTIs.  Because her A1c is less than 7 we will manage with diet.  We will follow-up in 3 months.  Will need foot exam, UACR.  Per the patient she had a recent diabetic retinopathy exam with her optometrist Dr. Anner Crete.

## 2023-11-16 NOTE — Assessment & Plan Note (Signed)
 Patient has occasional episodes with difficulty sleeping for which she has recently been prescribed temazepam.  Prior to that in the past she has been on Xanax.  Continue to fill temazepam as needed.  Does not use every night.

## 2023-11-16 NOTE — Assessment & Plan Note (Signed)
 The patient was accompanied by her husband today.  He is also a patient of mine.  The patient's husband complained of cough starting last night.  Because of the patient's chemotherapy treatments I offered to test him for COVID and flu.  Husband tested positive for flu B.  I prescribed him and the patient both 5 days of Tamiflu twice daily, and for treatment and her for prophylaxis.  Prior this years CDC guidelines, Tamiflu prophylaxis is twice a day.

## 2023-11-16 NOTE — Patient Instructions (Addendum)
 It was nice to see you today,  We addressed the following topics today: -Your A1c was 6.8.  Previously it was 6.9.  Both of these are in the range for diabetes.  You have diabetes, but because your A1c is less than 7 and you have not tolerated the other medications in the past we will try to control it with just diet for now.  I would like to see you back in 3 months - I have sent you in a order for Tamiflu for prophylaxis because your husband tested positive for flu.  You should take it twice a day for 5 days but if you feel like you do not tolerate twice a day dosing just take it once a day until you run out of the 10 pills.   Have a great day,  Frederic Jericho, MD

## 2023-11-17 ENCOUNTER — Other Ambulatory Visit: Payer: Self-pay

## 2023-12-04 ENCOUNTER — Inpatient Hospital Stay: Payer: Medicare Other | Attending: Internal Medicine

## 2023-12-04 ENCOUNTER — Inpatient Hospital Stay: Payer: Medicare Other

## 2023-12-04 ENCOUNTER — Inpatient Hospital Stay (HOSPITAL_BASED_OUTPATIENT_CLINIC_OR_DEPARTMENT_OTHER): Payer: Medicare Other | Admitting: Internal Medicine

## 2023-12-04 VITALS — BP 132/82 | HR 90 | Temp 97.9°F | Resp 16 | Ht 64.0 in | Wt 165.9 lb

## 2023-12-04 DIAGNOSIS — C7931 Secondary malignant neoplasm of brain: Secondary | ICD-10-CM | POA: Diagnosis not present

## 2023-12-04 DIAGNOSIS — D649 Anemia, unspecified: Secondary | ICD-10-CM | POA: Diagnosis not present

## 2023-12-04 DIAGNOSIS — G4700 Insomnia, unspecified: Secondary | ICD-10-CM | POA: Diagnosis not present

## 2023-12-04 DIAGNOSIS — C3411 Malignant neoplasm of upper lobe, right bronchus or lung: Secondary | ICD-10-CM | POA: Diagnosis not present

## 2023-12-04 DIAGNOSIS — Z79899 Other long term (current) drug therapy: Secondary | ICD-10-CM | POA: Diagnosis not present

## 2023-12-04 DIAGNOSIS — Z923 Personal history of irradiation: Secondary | ICD-10-CM | POA: Insufficient documentation

## 2023-12-04 DIAGNOSIS — Z5111 Encounter for antineoplastic chemotherapy: Secondary | ICD-10-CM | POA: Insufficient documentation

## 2023-12-04 LAB — CBC WITH DIFFERENTIAL (CANCER CENTER ONLY)
Abs Immature Granulocytes: 0.09 10*3/uL — ABNORMAL HIGH (ref 0.00–0.07)
Basophils Absolute: 0 10*3/uL (ref 0.0–0.1)
Basophils Relative: 0 %
Eosinophils Absolute: 0 10*3/uL (ref 0.0–0.5)
Eosinophils Relative: 0 %
HCT: 37.9 % (ref 36.0–46.0)
Hemoglobin: 12.1 g/dL (ref 12.0–15.0)
Immature Granulocytes: 1 %
Lymphocytes Relative: 5 %
Lymphs Abs: 0.6 10*3/uL — ABNORMAL LOW (ref 0.7–4.0)
MCH: 32.3 pg (ref 26.0–34.0)
MCHC: 31.9 g/dL (ref 30.0–36.0)
MCV: 101.1 fL — ABNORMAL HIGH (ref 80.0–100.0)
Monocytes Absolute: 0.8 10*3/uL (ref 0.1–1.0)
Monocytes Relative: 7 %
Neutro Abs: 9.6 10*3/uL — ABNORMAL HIGH (ref 1.7–7.7)
Neutrophils Relative %: 87 %
Platelet Count: 335 10*3/uL (ref 150–400)
RBC: 3.75 MIL/uL — ABNORMAL LOW (ref 3.87–5.11)
RDW: 15.4 % (ref 11.5–15.5)
WBC Count: 11.1 10*3/uL — ABNORMAL HIGH (ref 4.0–10.5)
nRBC: 0 % (ref 0.0–0.2)

## 2023-12-04 LAB — CMP (CANCER CENTER ONLY)
ALT: 8 U/L (ref 0–44)
AST: 12 U/L — ABNORMAL LOW (ref 15–41)
Albumin: 4.2 g/dL (ref 3.5–5.0)
Alkaline Phosphatase: 89 U/L (ref 38–126)
Anion gap: 9 (ref 5–15)
BUN: 20 mg/dL (ref 8–23)
CO2: 24 mmol/L (ref 22–32)
Calcium: 9.8 mg/dL (ref 8.9–10.3)
Chloride: 105 mmol/L (ref 98–111)
Creatinine: 0.91 mg/dL (ref 0.44–1.00)
GFR, Estimated: >60 mL/min (ref >60)
Glucose, Bld: 157 mg/dL — ABNORMAL HIGH (ref 70–99)
Potassium: 4.3 mmol/L (ref 3.5–5.1)
Sodium: 138 mmol/L (ref 135–145)
Total Bilirubin: 0.4 mg/dL (ref 0.0–1.2)
Total Protein: 7.4 g/dL (ref 6.5–8.1)

## 2023-12-04 MED ORDER — SODIUM CHLORIDE 0.9 % IV SOLN
500.0000 mg/m2 | Freq: Once | INTRAVENOUS | Status: AC
  Administered 2023-12-04: 900 mg via INTRAVENOUS
  Filled 2023-12-04: qty 20

## 2023-12-04 MED ORDER — SODIUM CHLORIDE 0.9 % IV SOLN: Freq: Once | INTRAVENOUS | Status: AC

## 2023-12-04 MED ORDER — DEXAMETHASONE SODIUM PHOSPHATE 10 MG/ML IJ SOLN
10.0000 mg | Freq: Once | INTRAMUSCULAR | Status: AC
  Administered 2023-12-04: 10 mg via INTRAVENOUS
  Filled 2023-12-04: qty 1

## 2023-12-04 NOTE — Patient Instructions (Signed)
 [facility] - A DEPT OF [facility]  Discharge Instructions: Thank you for choosing [facility] to provide your oncology and hematology care.   If you have a lab appointment with the Cancer Center, please go directly to the Cancer Center and check in at the registration area.   Wear comfortable clothing and clothing appropriate for easy access to any Portacath or PICC line.   We strive to give you quality time with your provider. You may need to reschedule your appointment if you arrive late (15 or more minutes).  Arriving late affects you and other patients whose appointments are after yours.  Also, if you miss three or more appointments without notifying the office, you may be dismissed from the clinic at the provider's discretion.      For prescription refill requests, have your pharmacy contact our office and allow 72 hours for refills to be completed.    Today you received the following chemotherapy and/or immunotherapy agent: Pemetrexed (Alimta)      To help prevent nausea and vomiting after your treatment, we encourage you to take your nausea medication as directed.  BELOW ARE SYMPTOMS THAT SHOULD BE REPORTED IMMEDIATELY: *FEVER GREATER THAN 100.4 F (38 C) OR HIGHER *CHILLS OR SWEATING *NAUSEA AND VOMITING THAT IS NOT CONTROLLED WITH YOUR NAUSEA MEDICATION *UNUSUAL SHORTNESS OF BREATH *UNUSUAL BRUISING OR BLEEDING *URINARY PROBLEMS (pain or burning when urinating, or frequent urination) *BOWEL PROBLEMS (unusual diarrhea, constipation, pain near the anus) TENDERNESS IN MOUTH AND THROAT WITH OR WITHOUT PRESENCE OF ULCERS (sore throat, sores in mouth, or a toothache) UNUSUAL RASH, SWELLING OR PAIN  UNUSUAL VAGINAL DISCHARGE OR ITCHING   Items with * indicate a potential emergency and should be followed up as soon as possible or go to the Emergency Department if any problems should occur.  Please show the CHEMOTHERAPY ALERT CARD or  IMMUNOTHERAPY ALERT CARD at check-in to the Emergency Department and triage nurse.  Should you have questions after your visit or need to cancel or reschedule your appointment, please contact [facility] - A DEPT OF [facility]  Dept: [phone]  and follow the prompts.  Office hours are 8:00 a.m. to 4:30 p.m. Monday - Friday. Please note that voicemails left after 4:00 p.m. may not be returned until the following business day.  We are closed weekends and major holidays. You have access to a nurse at all times for urgent questions. Please call the main number to the clinic Dept: [phone] and follow the prompts.   For any non-urgent questions, you may also contact your provider using MyChart. We now offer e-Visits for anyone 89 and older to request care online for non-urgent symptoms. For details visit [email or web address].   Also download the MyChart app! Go to the app store, search "MyChart", open the app, select [facility], and log in with your MyChart username and password.  Pemetrexed Injection What is this medication? PEMETREXED (PEM e TREX ed) treats some types of cancer. It works by slowing down the growth of cancer cells. This medicine may be used for other purposes; ask your health care provider or pharmacist if you have questions. COMMON BRAND NAME(S): Alimta, PEMFEXY, PEMRYDI RTU What should I tell my care team before I take this medication? They need to know if you have any of these conditions: Infection, such as chickenpox, cold sores, or herpes Kidney disease Low blood cell levels (white cells,  red cells, and platelets) Lung or breathing disease, such as asthma Radiation therapy An unusual or allergic reaction to pemetrexed, other medications, foods, dyes, or preservatives If you or your partner are pregnant or trying to get pregnant Breast-feeding How should I use this medication? This medication is injected into a vein. It is given by  your care team in a hospital or clinic setting. Talk to your care team about the use of this medication in children. Special care may be needed. Overdosage: If you think you have taken too much of this medicine contact a poison control center or emergency room at once. NOTE: This medicine is only for you. Do not share this medicine with others. What if I miss a dose? Keep appointments for follow-up doses. It is important not to miss your dose. Call your care team if you are unable to keep an appointment. What may interact with this medication? Do not take this medication with any of the following: Live virus vaccines This medication may also interact with the following: Ibuprofen This list may not describe all possible interactions. Give your health care provider a list of all the medicines, herbs, non-prescription drugs, or dietary supplements you use. Also tell them if you smoke, drink alcohol, or use illegal drugs. Some items may interact with your medicine. What should I watch for while using this medication? Your condition will be monitored carefully while you are receiving this medication. This medication may make you feel generally unwell. This is not uncommon as chemotherapy can affect healthy cells as well as cancer cells. Report any side effects. Continue your course of treatment even though you feel ill unless your care team tells you to stop. This medication can cause serious side effects. To reduce the risk, your care team may give you other medications to take before receiving this one. Be sure to follow the directions from your care team. This medication can cause a rash or redness in areas of the body that have previously had radiation therapy. If you have had radiation therapy, tell your care team if you notice a rash in this area. This medication may increase your risk of getting an infection. Call your care team for advice if you get a fever, chills, sore throat, or other symptoms  of a cold or flu. Do not treat yourself. Try to avoid being around people who are sick. Be careful brushing or flossing your teeth or using a toothpick because you may get an infection or bleed more easily. If you have any dental work done, tell your dentist you are receiving this medication. Avoid taking medications that contain aspirin, acetaminophen, ibuprofen, naproxen, or ketoprofen unless instructed by your care team. These medications may hide a fever. Check with your care team if you have severe diarrhea, nausea, and vomiting, or if you sweat a lot. The loss of too much body fluid may make it dangerous for you to take this medication. Talk to your care team if you or your partner wish to become pregnant or think either of you might be pregnant. This medication can cause serious birth defects if taken during pregnancy and for 6 months after the last dose. A negative pregnancy test is required before starting this medication. A reliable form of contraception is recommended while taking this medication and for 6 months after the last dose. Talk to your care team about reliable forms of contraception. Do not father a child while taking this medication and for 3 months after the last  dose. Use a condom while having sex during this time period. Do not breastfeed while taking this medication and for 1 week after the last dose. This medication may cause infertility. Talk to your care team if you are concerned about your fertility. What side effects may I notice from receiving this medication? Side effects that you should report to your care team as soon as possible: Allergic reactions--skin rash, itching, hives, swelling of the face, lips, tongue, or throat Dry cough, shortness of breath or trouble breathing Infection--fever, chills, cough, sore throat, wounds that don't heal, pain or trouble when passing urine, general feeling of discomfort or being unwell Kidney injury--decrease in the amount of urine,  swelling of the ankles, hands, or feet Low red blood cell level--unusual weakness or fatigue, dizziness, headache, trouble breathing Redness, blistering, peeling, or loosening of the skin, including inside the mouth Unusual bruising or bleeding Side effects that usually do not require medical attention (report to your care team if they continue or are bothersome): Fatigue Loss of appetite Nausea Vomiting This list may not describe all possible side effects. Call your doctor for medical advice about side effects. You may report side effects to FDA at 1-800-FDA-1088. Where should I keep my medication? This medication is given in a hospital or clinic. It will not be stored at home. NOTE: This sheet is a summary. It may not cover all possible information. If you have questions about this medicine, talk to your doctor, pharmacist, or health care provider.  2024 Elsevier/Gold Standard (2021-12-21 00:00:00)

## 2023-12-04 NOTE — Progress Notes (Signed)
 [facility] [phone]   Fax:([phone]  OFFICE PROGRESS NOTE  Sandre Kitty, MD [address] [place] [address]  DIAGNOSIS: Stage IV (T2a, N0, M1b) non-small cell lung cancer, adenocarcinoma with negative EGFR and ALK mutations diagnosed in January 2015 and presented with right upper lobe lung mass in addition to a solitary brain metastasis.  PRIOR THERAPY: 1) Status post stereotactic radiotherapy to a solitary right parietal brain lesion under the care of Dr. Mitzi Hansen on 10/16/2013. 2) Status post palliative radiotherapy to the right lung tumor under the care of Dr. Mitzi Hansen completed on 12/05/2013. 3) Systemic chemotherapy with carboplatin for AUC of 5 and Alimta 500 mg/M2 every 3 weeks. First dose Jan 06 2014. Status post 6 cycles.  CURRENT THERAPY: Systemic chemotherapy with maintenance Alimta 500 MG/M2 every 3 weeks, status post 163 cycles.  INTERVAL HISTORY: Alexis [name] 73 y.o. female returns to the clinic today for follow-up visit accompanied by her husband Alexis [name]. Discussed the use of AI scribe software for clinical note transcription with the patient, who gave verbal consent to proceed.  History of Present Illness   The patient is a 72 year old with stage four adenocarcinoma who presents for evaluation for starting cycle number 164 of chemotherapy. She is accompanied by her husband, Alexis [name].  She was diagnosed with stage four adenocarcinoma in January 2015 and has been undergoing treatment since then. Her treatment has included various regimens, and she is currently on maintenance therapy.  She is receiving Alimta, 500 mg/m2 every three weeks, and has completed 163 cycles. No significant issues with the last treatment cycle. No nausea, vomiting, chest pain, or breathing issues. She mentions a bruise from a lab draw but otherwise feels well.  Her husband, Alexis [name], is actively involved in her care, providing support and assistance with daily  activities.         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  MEDICATIONS:  Current Outpatient Medications  Medication Sig Dispense Refill   acetaminophen (TYLENOL) 500 MG tablet Take 500 mg by mouth every 6 (six) hours as needed for mild pain or headache. Reported on 11/02/2015     Ascorbic Acid (VITAMIN C GUMMIE PO) Take 1 each by mouth every morning.     aspirin EC 81 MG tablet Take 1 tablet (81 mg total) by mouth daily. 150 tablet 2   Cranberry 240 MG CAPS Take 1 capsule by mouth daily. This is in her probiotic     [employer] ACID CONTROLLER 10 MG tablet SMARTSIG:1 Tablet(s) By Mouth Every 12 Hours PRN     dexamethasone (DECADRON) 4 MG tablet TAKE 1 TABLET TWICE A DAY THE DAY BEFORE, THE DAY OF, AND THE DAY AFTER CHEMOTHERAPY. 40 tablet 2   estradiol (ESTRACE) 0.1 MG/GM vaginal cream Place 1 Applicatorful vaginally daily.     folic acid (FOLVITE) 1 MG tablet TAKE 1 TABLET BY MOUTH EVERY DAY 90 tablet 0   glimepiride (AMARYL) 2 MG tablet TAKE 1 TABLET BY MOUTH  DAILY BEFORE BREAKFAST. 90 tablet 0   loratadine (CLARITIN) 10 MG tablet Take 10 mg by mouth daily.     Multiple Vitamin (MULTIVITAMIN WITH MINERALS) TABS tablet Take 1 tablet by mouth every morning.     omeprazole (PRILOSEC) 20 MG capsule TAKE 1 CAPSULE BY MOUTH EVERY DAY 90 capsule 0   ondansetron (ZOFRAN) 8 MG tablet TAKE 1 TABLET BY MOUTH BEFORE CHEMO 30 tablet 1   phenazopyridine (PYRIDIUM) 200 MG tablet Take 1 tablet (200 mg total) by mouth every 8 (eight) hours as needed. 10 tablet 0   prochlorperazine (COMPAZINE)  10 MG tablet Take 1 tablet (10 mg total) by mouth every 6 (six) hours as needed for nausea or vomiting. 60 tablet 0   rosuvastatin (CRESTOR) 10 MG tablet Take 1 tablet (10 mg total) by mouth daily. 30 tablet 12   senna-docusate (SENOKOT-S) 8.6-50 MG tablet Take 1 tablet by mouth daily. 30 tablet prn   temazepam (RESTORIL) 15 MG capsule Take 1 capsule (15 mg total) by mouth at bedtime as needed for sleep. 30 capsule 0   trimethoprim (TRIMPEX) 100 MG tablet Take 100 mg by mouth daily.     Vitamin D, Ergocalciferol, (DRISDOL) 1.25 MG (50000 UNIT) CAPS capsule Take 50,000 Units by mouth once a week. Vit d 3     No current facility-administered medications for this visit.    SURGICAL HISTORY:  Past Surgical History:  Procedure Laterality Date   ABDOMINAL HYSTERECTOMY     COLOSTOMY TAKEDOWN N/A 07/10/2014   Procedure: LAPAROSCOPIC LYSIS OF ADHESIONS (90 MIN) LAPAROSCOPIC ASSISTED COLOSTOMY CLOSURE, RIGID PROCTOSIGMOIDOSCOPY;  Surgeon: Avel Peace, MD;  Location: [facility];  Service: General;  Laterality: N/A;   LAPAROTOMY N/A 11/03/2013   Procedure: EXPLORATORY LAPAROTOMY, DRAINAGE OF INTRA  ABDOMINAL ABSCESSES, MOBILIZATION OF SPLENIC FLEXURE, SIGMOID COLECTOMY WITH COLOSTOMY;  Surgeon: Adolph Pollack, MD;  Location: [facility];  Service: General;  Laterality: N/A;   VIDEO BRONCHOSCOPY Bilateral 08/30/2013   Procedure: VIDEO BRONCHOSCOPY WITH FLUORO;  Surgeon: Nyoka Cowden, MD;  Location: [facility];  Service: Cardiopulmonary;  Laterality: Bilateral;    REVIEW OF SYSTEMS:  A comprehensive review of systems was negative.   PHYSICAL EXAMINATION: General appearance: alert, cooperative, and no distress Head: Normocephalic, without obvious abnormality, atraumatic Neck: no adenopathy, no JVD, supple, symmetrical, trachea midline, and thyroid not enlarged, symmetric, no tenderness/mass/nodules Lymph nodes: Cervical, supraclavicular, and axillary nodes normal. Resp: clear to auscultation  bilaterally Back: symmetric, no curvature. ROM normal. No CVA tenderness. Cardio: regular rate and rhythm, S1, S2 normal, no murmur, click, rub or gallop GI: soft, non-tender; bowel sounds normal; no masses,  no organomegaly Extremities: extremities normal, atraumatic, no cyanosis or edema   ECOG PERFORMANCE STATUS: 1 - Symptomatic but completely ambulatory   Blood pressure 132/82, pulse 90, temperature 97.9 F (36.6 C), temperature source Temporal, resp. rate 16, height 5\' 4"  (1.626 m), weight 165 lb 14.4 oz (75.3 kg), SpO2 100%.  LABORATORY DATA: Lab Results  Component Value Date   WBC 11.1 (H) 12/04/2023   HGB 12.1 12/04/2023   HCT 37.9 12/04/2023   MCV 101.1 (H) 12/04/2023   PLT 335 12/04/2023      Chemistry      Component Value Date/Time   NA 137 11/13/2023 0858   NA 140 11/03/2022 0844   NA 139 08/14/2017 0837   K 4.3 11/13/2023 0858   K 4.4 08/14/2017 0837   CL 105 11/13/2023 0858   CO2 25 11/13/2023 0858   CO2 24  08/14/2017 0837   BUN 19 11/13/2023 0858   BUN 33 (H) 11/03/2022 0844   BUN 13.3 08/14/2017 0837   CREATININE 0.87 11/13/2023 0858   CREATININE 0.8 08/14/2017 0837      Component Value Date/Time   CALCIUM 9.3 11/13/2023 0858   CALCIUM 9.3 08/14/2017 0837   ALKPHOS 91 11/13/2023 0858   ALKPHOS 107 08/14/2017 0837   AST 10 (L) 11/13/2023 0858   AST 12 08/14/2017 0837   ALT 11 11/13/2023 0858   ALT 12 08/14/2017 0837   BILITOT 0.4 11/13/2023 0858   BILITOT 0.37 08/14/2017 0837       RADIOGRAPHIC STUDIES: CT Chest W Contrast Result Date: 11/13/2023 CLINICAL DATA:  Follow-up non-small cell lung carcinoma. Previous radiation therapy. Chemotherapy in progress. * Tracking Code: BO * EXAM: CT CHEST, ABDOMEN, AND PELVIS WITH CONTRAST TECHNIQUE: Multidetector CT imaging of the chest, abdomen and pelvis was performed following the standard protocol during bolus administration of intravenous contrast. RADIATION DOSE REDUCTION: This exam was performed  according to the departmental dose-optimization program which includes automated exposure control, adjustment of the mA and/or kV according to patient size and/or use of iterative reconstruction technique. CONTRAST:  OMNIPAQUE IOHEXOL 300 MG/ML  SOLN COMPARISON:  08/14/2023 FINDINGS: CT CHEST FINDINGS Cardiovascular: No acute findings. Mediastinum/Lymph Nodes: No masses or pathologically enlarged lymph nodes identified. Lungs/Pleura: Stable post radiation changes are seen in the medial right upper lobe and right lung apex. Pleural-parenchymal scarring in left lung apex is also stable. No suspicious pulmonary nodules or masses identified. No evidence of infiltrate or pleural effusion. Musculoskeletal:  No suspicious bone lesions identified. CT ABDOMEN AND PELVIS FINDINGS Hepatobiliary: No masses identified. Gallbladder is unremarkable. No evidence of biliary ductal dilatation. Pancreas:  No mass or inflammatory changes. Spleen:  Within normal limits in size and appearance. Adrenals/Urinary tract: No suspicious masses or hydronephrosis. Stomach/Bowel: Stable rectosigmoid surgical anastomosis. No evidence of obstruction, inflammatory process, or abnormal fluid collections. Vascular/Lymphatic: No pathologically enlarged lymph nodes identified. No acute vascular findings. Reproductive: Prior hysterectomy noted. Adnexal regions are unremarkable in appearance. Other:  None. Musculoskeletal:  No suspicious bone lesions identified. IMPRESSION: Stable post radiation changes in right upper lobe. No evidence of recurrent or metastatic carcinoma within the chest, abdomen, or pelvis. Electronically Signed   By: Danae Orleans M.D.   On: 11/13/2023 09:55   CT ABDOMEN PELVIS W CONTRAST Result Date: 11/13/2023 CLINICAL DATA:  Follow-up non-small cell lung carcinoma. Previous radiation therapy. Chemotherapy in progress. * Tracking Code: BO * EXAM: CT CHEST, ABDOMEN, AND PELVIS WITH CONTRAST TECHNIQUE: Multidetector CT imaging  of the chest, abdomen and pelvis was performed following the standard protocol during bolus administration of intravenous contrast. RADIATION DOSE REDUCTION: This exam was performed according to the departmental dose-optimization program which includes automated exposure control, adjustment of the mA and/or kV according to patient size and/or use of iterative reconstruction technique. CONTRAST:  OMNIPAQUE IOHEXOL 300 MG/ML  SOLN COMPARISON:  08/14/2023 FINDINGS: CT CHEST FINDINGS Cardiovascular: No acute findings. Mediastinum/Lymph Nodes: No masses or pathologically enlarged lymph nodes identified. Lungs/Pleura: Stable post radiation changes are seen in the medial right upper lobe and right lung apex. Pleural-parenchymal scarring in left lung apex is also stable. No suspicious pulmonary nodules or masses identified. No evidence of infiltrate or pleural effusion. Musculoskeletal:  No suspicious bone lesions identified. CT ABDOMEN AND PELVIS FINDINGS Hepatobiliary: No masses identified. Gallbladder is unremarkable. No evidence of biliary ductal dilatation. Pancreas:  No mass or inflammatory changes. Spleen:  Within  normal limits in size and appearance. Adrenals/Urinary tract: No suspicious masses or hydronephrosis. Stomach/Bowel: Stable rectosigmoid surgical anastomosis. No evidence of obstruction, inflammatory process, or abnormal fluid collections. Vascular/Lymphatic: No pathologically enlarged lymph nodes identified. No acute vascular findings. Reproductive: Prior hysterectomy noted. Adnexal regions are unremarkable in appearance. Other:  None. Musculoskeletal:  No suspicious bone lesions identified. IMPRESSION: Stable post radiation changes in right upper lobe. No evidence of recurrent or metastatic carcinoma within the chest, abdomen, or pelvis. Electronically Signed   By: Danae Orleans M.D.   On: 11/13/2023 09:55     ASSESSMENT AND PLAN:  This is a very pleasant 73 years old white female with  metastatic non-small cell lung cancer, adenocarcinoma status post induction systemic chemotherapy with carboplatin and Alimta with partial response.  The patient has no actionable EGFR or ALK mutations at the time of her diagnosis. The patient is currently on maintenance treatment with single agent Alimta status post 163 cycles. She has been tolerating this treatment fairly well with no concerning adverse effects.    Stage IV adenocarcinoma Diagnosed in January 2015, she has been undergoing maintenance treatment with pemetrexed for over ten years. Currently on cycle 163, administered every three weeks, she reports no significant side effects such as nausea, vomiting, chest pain, or dyspnea. The treatment is well-tolerated and effective in managing her condition. She is ready to proceed with the next cycle. - Proceed with cycle 164 of pemetrexed.   She will come back for follow-up visit in 3 weeks before the next cycle of her treatment. For the anemia, the patient will continue on the oral iron tablets. She was advised to call immediately if she has any other concerning symptoms in the interval. The patient voices understanding of current disease status and treatment options and is in agreement with the current care plan. All questions were answered. The patient knows to call the clinic with any problems, questions or concerns. We can certainly see the patient much sooner if necessary.  Disclaimer: This note was dictated with voice recognition software. Similar sounding words can inadvertently be transcribed and may not be corrected upon review.

## 2023-12-09 ENCOUNTER — Other Ambulatory Visit: Payer: Self-pay | Admitting: Physician Assistant

## 2023-12-09 DIAGNOSIS — C3490 Malignant neoplasm of unspecified part of unspecified bronchus or lung: Secondary | ICD-10-CM

## 2023-12-17 ENCOUNTER — Other Ambulatory Visit: Payer: Self-pay | Admitting: Physician Assistant

## 2023-12-17 DIAGNOSIS — C3411 Malignant neoplasm of upper lobe, right bronchus or lung: Secondary | ICD-10-CM

## 2023-12-25 ENCOUNTER — Inpatient Hospital Stay (HOSPITAL_BASED_OUTPATIENT_CLINIC_OR_DEPARTMENT_OTHER): Payer: Medicare Other | Admitting: Internal Medicine

## 2023-12-25 ENCOUNTER — Inpatient Hospital Stay: Payer: Medicare Other

## 2023-12-25 DIAGNOSIS — C3411 Malignant neoplasm of upper lobe, right bronchus or lung: Secondary | ICD-10-CM

## 2023-12-25 DIAGNOSIS — G4700 Insomnia, unspecified: Secondary | ICD-10-CM | POA: Diagnosis not present

## 2023-12-25 DIAGNOSIS — D649 Anemia, unspecified: Secondary | ICD-10-CM | POA: Diagnosis not present

## 2023-12-25 DIAGNOSIS — Z923 Personal history of irradiation: Secondary | ICD-10-CM | POA: Diagnosis not present

## 2023-12-25 DIAGNOSIS — Z5111 Encounter for antineoplastic chemotherapy: Secondary | ICD-10-CM | POA: Diagnosis not present

## 2023-12-25 DIAGNOSIS — C7931 Secondary malignant neoplasm of brain: Secondary | ICD-10-CM | POA: Diagnosis not present

## 2023-12-25 LAB — CBC WITH DIFFERENTIAL (CANCER CENTER ONLY)
Abs Immature Granulocytes: 0.05 10*3/uL (ref 0.00–0.07)
Basophils Absolute: 0 10*3/uL (ref 0.0–0.1)
Basophils Relative: 0 %
Eosinophils Absolute: 0 10*3/uL (ref 0.0–0.5)
Eosinophils Relative: 0 %
HCT: 35.9 % — ABNORMAL LOW (ref 36.0–46.0)
Hemoglobin: 11.5 g/dL — ABNORMAL LOW (ref 12.0–15.0)
Immature Granulocytes: 1 %
Lymphocytes Relative: 7 %
Lymphs Abs: 0.6 10*3/uL — ABNORMAL LOW (ref 0.7–4.0)
MCH: 31.9 pg (ref 26.0–34.0)
MCHC: 32 g/dL (ref 30.0–36.0)
MCV: 99.4 fL (ref 80.0–100.0)
Monocytes Absolute: 0.6 10*3/uL (ref 0.1–1.0)
Monocytes Relative: 7 %
Neutro Abs: 7.1 10*3/uL (ref 1.7–7.7)
Neutrophils Relative %: 85 %
Platelet Count: 289 10*3/uL (ref 150–400)
RBC: 3.61 MIL/uL — ABNORMAL LOW (ref 3.87–5.11)
RDW: 15.2 % (ref 11.5–15.5)
WBC Count: 8.3 10*3/uL (ref 4.0–10.5)
nRBC: 0 % (ref 0.0–0.2)

## 2023-12-25 LAB — CMP (CANCER CENTER ONLY)
ALT: 10 U/L (ref 0–44)
AST: 12 U/L — ABNORMAL LOW (ref 15–41)
Albumin: 4.1 g/dL (ref 3.5–5.0)
Alkaline Phosphatase: 84 U/L (ref 38–126)
Anion gap: 8 (ref 5–15)
BUN: 24 mg/dL — ABNORMAL HIGH (ref 8–23)
CO2: 25 mmol/L (ref 22–32)
Calcium: 9.5 mg/dL (ref 8.9–10.3)
Chloride: 105 mmol/L (ref 98–111)
Creatinine: 0.92 mg/dL (ref 0.44–1.00)
GFR, Estimated: >60 mL/min (ref >60)
Glucose, Bld: 158 mg/dL — ABNORMAL HIGH (ref 70–99)
Potassium: 4.5 mmol/L (ref 3.5–5.1)
Sodium: 138 mmol/L (ref 135–145)
Total Bilirubin: 0.4 mg/dL (ref 0.0–1.2)
Total Protein: 6.9 g/dL (ref 6.5–8.1)

## 2023-12-25 MED ORDER — TEMAZEPAM 15 MG PO CAPS: 15.0000 mg | ORAL_CAPSULE | Freq: Every evening | ORAL | 0 refills | Status: DC | PRN

## 2023-12-25 MED ORDER — SODIUM CHLORIDE 0.9 % IV SOLN: Freq: Once | INTRAVENOUS | Status: AC

## 2023-12-25 MED ORDER — CYANOCOBALAMIN 1000 MCG/ML IJ SOLN
1000.0000 ug | Freq: Once | INTRAMUSCULAR | Status: AC
  Administered 2023-12-25: 1000 ug via INTRAMUSCULAR
  Filled 2023-12-25: qty 1

## 2023-12-25 MED ORDER — DEXAMETHASONE SODIUM PHOSPHATE 10 MG/ML IJ SOLN
10.0000 mg | Freq: Once | INTRAMUSCULAR | Status: DC
  Filled 2023-12-25: qty 1

## 2023-12-25 MED ORDER — SODIUM CHLORIDE 0.9 % IV SOLN
500.0000 mg/m2 | Freq: Once | INTRAVENOUS | Status: AC
  Administered 2023-12-25: 900 mg via INTRAVENOUS
  Filled 2023-12-25: qty 20

## 2023-12-25 NOTE — Patient Instructions (Signed)
 [facility] - A DEPT OF [facility]  Discharge Instructions: Thank you for choosing [facility] to provide your oncology and hematology care.   If you have a lab appointment with the Cancer Center, please go directly to the Cancer Center and check in at the registration area.   Wear comfortable clothing and clothing appropriate for easy access to any Portacath or PICC line.   We strive to give you quality time with your provider. You may need to reschedule your appointment if you arrive late (15 or more minutes).  Arriving late affects you and other patients whose appointments are after yours.  Also, if you miss three or more appointments without notifying the office, you may be dismissed from the clinic at the provider's discretion.      For prescription refill requests, have your pharmacy contact our office and allow 72 hours for refills to be completed.    Today you received the following chemotherapy and/or immunotherapy agents alimta      To help prevent nausea and vomiting after your treatment, we encourage you to take your nausea medication as directed.  BELOW ARE SYMPTOMS THAT SHOULD BE REPORTED IMMEDIATELY: *FEVER GREATER THAN 100.4 F (38 C) OR HIGHER *CHILLS OR SWEATING *NAUSEA AND VOMITING THAT IS NOT CONTROLLED WITH YOUR NAUSEA MEDICATION *UNUSUAL SHORTNESS OF BREATH *UNUSUAL BRUISING OR BLEEDING *URINARY PROBLEMS (pain or burning when urinating, or frequent urination) *BOWEL PROBLEMS (unusual diarrhea, constipation, pain near the anus) TENDERNESS IN MOUTH AND THROAT WITH OR WITHOUT PRESENCE OF ULCERS (sore throat, sores in mouth, or a toothache) UNUSUAL RASH, SWELLING OR PAIN  UNUSUAL VAGINAL DISCHARGE OR ITCHING   Items with * indicate a potential emergency and should be followed up as soon as possible or go to the Emergency Department if any problems should occur.  Please show the CHEMOTHERAPY ALERT CARD or IMMUNOTHERAPY  ALERT CARD at check-in to the Emergency Department and triage nurse.  Should you have questions after your visit or need to cancel or reschedule your appointment, please contact [facility] - A DEPT OF [facility]  Dept: [phone]  and follow the prompts.  Office hours are 8:00 a.m. to 4:30 p.m. Monday - Friday. Please note that voicemails left after 4:00 p.m. may not be returned until the following business day.  We are closed weekends and major holidays. You have access to a nurse at all times for urgent questions. Please call the main number to the clinic Dept: [phone] and follow the prompts.   For any non-urgent questions, you may also contact your provider using MyChart. We now offer e-Visits for anyone 50 and older to request care online for non-urgent symptoms. For details visit [email or web address].   Also download the MyChart app! Go to the app store, search "MyChart", open the app, select [facility], and log in with your MyChart username and password.

## 2023-12-25 NOTE — Progress Notes (Signed)
 [facility] [phone]   Fax:([phone]) [phone]  OFFICE PROGRESS NOTE  [name], MD [address] [place] [address]  DIAGNOSIS: Stage IV (T2a, N0, M1b) non-small cell lung cancer, adenocarcinoma with negative EGFR and ALK mutations diagnosed in January 2015 and presented with right upper lobe lung mass in addition to a solitary brain metastasis.  PRIOR THERAPY: 1) Status post stereotactic radiotherapy to a solitary right parietal brain lesion under the care of Dr. Jeryl Moris on 10/16/2013. 2) Status post palliative radiotherapy to the right lung tumor under the care of Dr. Jeryl Moris completed on 12/05/2013. 3) Systemic chemotherapy with carboplatin  for AUC of 5 and Alimta  500 mg/M2 every 3 weeks. First dose Jan 06 2014. Status post 6 cycles.  CURRENT THERAPY: Systemic chemotherapy with maintenance Alimta  500 MG/M2 every 3 weeks, status post 163 cycles.  INTERVAL HISTORY: Alexis [name] 73 y.o. female returns to the clinic today for follow-up visit accompanied by her husband Alexis [name]. Discussed the use of AI scribe software for clinical note transcription with the patient, who gave verbal consent to proceed.  History of Present Illness   Alexis [name] is a 72 year old female with stage four non-small cell lung cancer who presents for evaluation before starting cycle number 165 of maintenance chemotherapy.  She was diagnosed with stage four non-small cell lung cancer, adenocarcinoma, in January 2015. Initially, she underwent systemic chemotherapy with carboplatin  and Alimta . She has been on maintenance treatment with single-agent Alimta  for 164 cycles.  She feels 'great' since her last visit with no complaints. No nausea, vomiting, diarrhea, or headaches. Her weight has remained stable.  Her diabetes management is stable, with a recent A1c reported as either 5.6 or 5.9. No medication has been started for it.         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  MEDICATIONS:  Current Outpatient Medications  Medication Sig Dispense Refill   acetaminophen  (TYLENOL ) 500 MG tablet Take 500 mg by mouth every 6 (six) hours as needed for mild pain or headache. Reported on 11/02/2015     Ascorbic Acid (VITAMIN C GUMMIE PO) Take 1 each by mouth every morning.     aspirin  EC 81 MG tablet Take 1 tablet (81 mg total) by mouth daily. 150 tablet 2   Cranberry 240 MG CAPS Take 1 capsule by mouth daily. This is in her probiotic     [employer] ACID CONTROLLER 10 MG tablet SMARTSIG:1 Tablet(s) By Mouth Every 12 Hours PRN     dexamethasone  (DECADRON ) 4 MG tablet TAKE 1 TABLET TWICE A DAY THE DAY BEFORE, THE DAY OF, AND THE DAY AFTER CHEMOTHERAPY. 40 tablet 2   estradiol (ESTRACE) 0.1 MG/GM vaginal cream Place 1 Applicatorful vaginally daily.     folic acid  (FOLVITE ) 1 MG tablet TAKE 1 TABLET BY MOUTH EVERY DAY 90 tablet 0   glimepiride  (AMARYL ) 2 MG tablet TAKE 1 TABLET BY MOUTH DAILY BEFORE BREAKFAST. 90 tablet 0   loratadine (CLARITIN) 10  MG tablet Take 10 mg by mouth daily.     Multiple Vitamin (MULTIVITAMIN WITH MINERALS) TABS tablet Take 1 tablet by mouth every morning.     omeprazole  (PRILOSEC) 20 MG capsule TAKE 1 CAPSULE BY MOUTH EVERY DAY 90 capsule 0   ondansetron  (ZOFRAN ) 8 MG tablet TAKE 1 TABLET BY MOUTH BEFORE CHEMO 30 tablet 1   phenazopyridine  (PYRIDIUM ) 200 MG tablet Take 1 tablet (200 mg total) by mouth every 8 (eight) hours as needed. 10 tablet 0   prochlorperazine  (COMPAZINE ) 10 MG tablet Take 1 tablet (10 mg total) by mouth every  6 (six) hours as needed for nausea or vomiting. 60 tablet 0   rosuvastatin  (CRESTOR ) 10 MG tablet Take 1 tablet (10 mg total) by mouth daily. 30 tablet 12   senna-docusate (SENOKOT-S) 8.6-50 MG tablet Take 1 tablet by mouth daily. 30 tablet prn   temazepam  (RESTORIL ) 15 MG capsule Take 1 capsule (15 mg total) by mouth at bedtime as needed for sleep. 30 capsule 0   trimethoprim  (TRIMPEX ) 100 MG tablet Take 100 mg by mouth daily.     Vitamin D, Ergocalciferol, (DRISDOL) 1.25 MG (50000 UNIT) CAPS capsule Take 50,000 Units by mouth once a week. Vit d 3     No current facility-administered medications for this visit.    SURGICAL HISTORY:  Past Surgical History:  Procedure Laterality Date   ABDOMINAL HYSTERECTOMY     COLOSTOMY TAKEDOWN N/A 07/10/2014   Procedure: LAPAROSCOPIC LYSIS OF ADHESIONS (90 MIN) LAPAROSCOPIC ASSISTED COLOSTOMY CLOSURE, RIGID PROCTOSIGMOIDOSCOPY;  Surgeon: Adalberto Hollow, MD;  Location: [facility];  Service: General;  Laterality: N/A;   LAPAROTOMY N/A 11/03/2013   Procedure: EXPLORATORY LAPAROTOMY, DRAINAGE OF INTRA  ABDOMINAL ABSCESSES, MOBILIZATION OF SPLENIC FLEXURE, SIGMOID COLECTOMY WITH COLOSTOMY;  Surgeon: Harlee Lichtenstein, MD;  Location: [facility];  Service: General;  Laterality: N/A;   VIDEO BRONCHOSCOPY Bilateral 08/30/2013   Procedure: VIDEO BRONCHOSCOPY WITH FLUORO;  Surgeon: Diamond Formica, MD;  Location: [facility];  Service: Cardiopulmonary;  Laterality: Bilateral;    REVIEW OF SYSTEMS:  A comprehensive review of systems was negative.   PHYSICAL EXAMINATION: General appearance: alert, cooperative, and no distress Head: Normocephalic, without obvious abnormality, atraumatic Neck: no adenopathy, no JVD, supple, symmetrical, trachea midline, and thyroid  not enlarged, symmetric, no tenderness/mass/nodules Lymph nodes: Cervical, supraclavicular, and axillary nodes normal. Resp: clear to auscultation bilaterally Back: symmetric, no curvature. ROM normal. No CVA  tenderness. Cardio: regular rate and rhythm, S1, S2 normal, no murmur, click, rub or gallop GI: soft, non-tender; bowel sounds normal; no masses,  no organomegaly Extremities: extremities normal, atraumatic, no cyanosis or edema   ECOG PERFORMANCE STATUS: 1 - Symptomatic but completely ambulatory   Blood pressure 130/66, pulse 87, temperature 97.6 F (36.4 C), temperature source Temporal, resp. rate 16, height 5\' 4"  (1.626 m), weight 165 lb 14.4 oz (75.3 kg), SpO2 100%.  LABORATORY DATA: Lab Results  Component Value Date   WBC 8.3 12/25/2023   HGB 11.5 (L) 12/25/2023   HCT 35.9 (L) 12/25/2023   MCV 99.4 12/25/2023   PLT 289 12/25/2023      Chemistry      Component Value Date/Time   NA 138 12/25/2023 0905   NA 140 11/03/2022 0844   NA 139 08/14/2017 0837   K 4.5 12/25/2023 0905   K 4.4 08/14/2017 0837   CL 105 12/25/2023 0905   CO2 25 12/25/2023 0905   CO2 24 08/14/2017 0837   BUN 24 (H) 12/25/2023 6045  BUN 33 (H) 11/03/2022 0844   BUN 13.3 08/14/2017 0837   CREATININE 0.92 12/25/2023 0905   CREATININE 0.8 08/14/2017 0837      Component Value Date/Time   CALCIUM  9.5 12/25/2023 0905   CALCIUM  9.3 08/14/2017 0837   ALKPHOS 84 12/25/2023 0905   ALKPHOS 107 08/14/2017 0837   AST 12 (L) 12/25/2023 0905   AST 12 08/14/2017 0837   ALT 10 12/25/2023 0905   ALT 12 08/14/2017 0837   BILITOT 0.4 12/25/2023 0905   BILITOT 0.37 08/14/2017 0837       RADIOGRAPHIC STUDIES: No results found.    ASSESSMENT AND PLAN:  This is a very pleasant 73 years old white female with metastatic non-small cell lung cancer, adenocarcinoma status post induction systemic chemotherapy with carboplatin  and Alimta  with partial response.  The patient has no actionable EGFR or ALK mutations at the time of her diagnosis. The patient is currently on maintenance treatment with single agent Alimta  status post 164 cycles.  She has been tolerating this treatment fairly well with no concerning adverse  effects.    Stage 4 non-small cell lung cancer, adenocarcinoma Stage 4 non-small cell lung cancer, adenocarcinoma, diagnosed in January 2015. Currently on maintenance treatment with single-agent Alimta , having completed 164 cycles. No new symptoms reported. No weight loss, nausea, vomiting, diarrhea, or headaches. A1c levels are well-controlled, indicating good overall health management. - Proceed with cycle number 165 of Alimta  today.   For the anemia, the patient will continue on the oral iron tablets. For the insomnia, I will give her a refill of Restoril  15 mg p.o. nightly She was advised to call immediately if she has any other concerning symptoms in the interval. The patient voices understanding of current disease status and treatment options and is in agreement with the current care plan. All questions were answered. The patient knows to call the clinic with any problems, questions or concerns. We can certainly see the patient much sooner if necessary.  Disclaimer: This note was dictated with voice recognition software. Similar sounding words can inadvertently be transcribed and may not be corrected upon review.

## 2024-01-15 ENCOUNTER — Inpatient Hospital Stay (HOSPITAL_BASED_OUTPATIENT_CLINIC_OR_DEPARTMENT_OTHER): Payer: Medicare Other | Admitting: Internal Medicine

## 2024-01-15 ENCOUNTER — Inpatient Hospital Stay: Payer: Medicare Other | Attending: Internal Medicine

## 2024-01-15 ENCOUNTER — Inpatient Hospital Stay: Payer: Medicare Other

## 2024-01-15 VITALS — BP 133/62 | HR 98 | Temp 98.1°F | Resp 17 | Ht 64.0 in | Wt 167.5 lb

## 2024-01-15 DIAGNOSIS — Z5111 Encounter for antineoplastic chemotherapy: Secondary | ICD-10-CM | POA: Diagnosis not present

## 2024-01-15 DIAGNOSIS — C7931 Secondary malignant neoplasm of brain: Secondary | ICD-10-CM | POA: Insufficient documentation

## 2024-01-15 DIAGNOSIS — Z79620 Long term (current) use of immunosuppressive biologic: Secondary | ICD-10-CM | POA: Diagnosis not present

## 2024-01-15 DIAGNOSIS — C3411 Malignant neoplasm of upper lobe, right bronchus or lung: Secondary | ICD-10-CM

## 2024-01-15 DIAGNOSIS — C3490 Malignant neoplasm of unspecified part of unspecified bronchus or lung: Secondary | ICD-10-CM

## 2024-01-15 DIAGNOSIS — Z87891 Personal history of nicotine dependence: Secondary | ICD-10-CM | POA: Insufficient documentation

## 2024-01-15 LAB — CBC WITH DIFFERENTIAL (CANCER CENTER ONLY)
Abs Immature Granulocytes: 0.07 10*3/uL (ref 0.00–0.07)
Basophils Absolute: 0 10*3/uL (ref 0.0–0.1)
Basophils Relative: 0 %
Eosinophils Absolute: 0 10*3/uL (ref 0.0–0.5)
Eosinophils Relative: 0 %
HCT: 35.5 % — ABNORMAL LOW (ref 36.0–46.0)
Hemoglobin: 11.5 g/dL — ABNORMAL LOW (ref 12.0–15.0)
Immature Granulocytes: 1 %
Lymphocytes Relative: 4 %
Lymphs Abs: 0.4 10*3/uL — ABNORMAL LOW (ref 0.7–4.0)
MCH: 32.1 pg (ref 26.0–34.0)
MCHC: 32.4 g/dL (ref 30.0–36.0)
MCV: 99.2 fL (ref 80.0–100.0)
Monocytes Absolute: 0.7 10*3/uL (ref 0.1–1.0)
Monocytes Relative: 7 %
Neutro Abs: 7.8 10*3/uL — ABNORMAL HIGH (ref 1.7–7.7)
Neutrophils Relative %: 88 %
Platelet Count: 297 10*3/uL (ref 150–400)
RBC: 3.58 MIL/uL — ABNORMAL LOW (ref 3.87–5.11)
RDW: 15.2 % (ref 11.5–15.5)
WBC Count: 8.9 10*3/uL (ref 4.0–10.5)
nRBC: 0 % (ref 0.0–0.2)

## 2024-01-15 LAB — CMP (CANCER CENTER ONLY)
ALT: 10 U/L (ref 0–44)
AST: 10 U/L — ABNORMAL LOW (ref 15–41)
Albumin: 4.1 g/dL (ref 3.5–5.0)
Alkaline Phosphatase: 78 U/L (ref 38–126)
Anion gap: 11 (ref 5–15)
BUN: 23 mg/dL (ref 8–23)
CO2: 22 mmol/L (ref 22–32)
Calcium: 9.5 mg/dL (ref 8.9–10.3)
Chloride: 106 mmol/L (ref 98–111)
Creatinine: 1.02 mg/dL — ABNORMAL HIGH (ref 0.44–1.00)
GFR, Estimated: 58 mL/min — ABNORMAL LOW (ref >60)
Glucose, Bld: 161 mg/dL — ABNORMAL HIGH (ref 70–99)
Potassium: 4.1 mmol/L (ref 3.5–5.1)
Sodium: 139 mmol/L (ref 135–145)
Total Bilirubin: 0.5 mg/dL (ref 0.0–1.2)
Total Protein: 6.9 g/dL (ref 6.5–8.1)

## 2024-01-15 MED ORDER — CYANOCOBALAMIN 1000 MCG/ML IJ SOLN: 1000.0000 ug | Freq: Once | INTRAMUSCULAR | Status: DC

## 2024-01-15 MED ORDER — SODIUM CHLORIDE 0.9 % IV SOLN: Freq: Once | INTRAVENOUS | Status: DC

## 2024-01-15 MED ORDER — DEXAMETHASONE SODIUM PHOSPHATE 10 MG/ML IJ SOLN
10.0000 mg | Freq: Once | INTRAMUSCULAR | Status: AC
  Administered 2024-01-15: 10 mg via INTRAVENOUS
  Filled 2024-01-15: qty 1

## 2024-01-15 MED ORDER — SODIUM CHLORIDE 0.9 % IV SOLN
500.0000 mg/m2 | Freq: Once | INTRAVENOUS | Status: AC
  Administered 2024-01-15: 900 mg via INTRAVENOUS
  Filled 2024-01-15: qty 20

## 2024-01-15 NOTE — Patient Instructions (Signed)
 [facility] - A DEPT OF [facility]  Discharge Instructions: Thank you for choosing [facility] to provide your oncology and hematology care.   If you have a lab appointment with the Cancer Center, please go directly to the Cancer Center and check in at the registration area.   Wear comfortable clothing and clothing appropriate for easy access to any Portacath or PICC line.   We strive to give you quality time with your provider. You may need to reschedule your appointment if you arrive late (15 or more minutes).  Arriving late affects you and other patients whose appointments are after yours.  Also, if you miss three or more appointments without notifying the office, you may be dismissed from the clinic at the provider's discretion.      For prescription refill requests, have your pharmacy contact our office and allow 72 hours for refills to be completed.    Today you received the following chemotherapy and/or immunotherapy agents Alimta      To help prevent nausea and vomiting after your treatment, we encourage you to take your nausea medication as directed.  BELOW ARE SYMPTOMS THAT SHOULD BE REPORTED IMMEDIATELY: *FEVER GREATER THAN 100.4 F (38 C) OR HIGHER *CHILLS OR SWEATING *NAUSEA AND VOMITING THAT IS NOT CONTROLLED WITH YOUR NAUSEA MEDICATION *UNUSUAL SHORTNESS OF BREATH *UNUSUAL BRUISING OR BLEEDING *URINARY PROBLEMS (pain or burning when urinating, or frequent urination) *BOWEL PROBLEMS (unusual diarrhea, constipation, pain near the anus) TENDERNESS IN MOUTH AND THROAT WITH OR WITHOUT PRESENCE OF ULCERS (sore throat, sores in mouth, or a toothache) UNUSUAL RASH, SWELLING OR PAIN  UNUSUAL VAGINAL DISCHARGE OR ITCHING   Items with * indicate a potential emergency and should be followed up as soon as possible or go to the Emergency Department if any problems should occur.  Please show the CHEMOTHERAPY ALERT CARD or IMMUNOTHERAPY  ALERT CARD at check-in to the Emergency Department and triage nurse.  Should you have questions after your visit or need to cancel or reschedule your appointment, please contact [facility] - A DEPT OF [facility]  Dept: [phone]  and follow the prompts.  Office hours are 8:00 a.m. to 4:30 p.m. Monday - Friday. Please note that voicemails left after 4:00 p.m. may not be returned until the following business day.  We are closed weekends and major holidays. You have access to a nurse at all times for urgent questions. Please call the main number to the clinic Dept: [phone] and follow the prompts.   For any non-urgent questions, you may also contact your provider using MyChart. We now offer e-Visits for anyone 79 and older to request care online for non-urgent symptoms. For details visit [email or web address].   Also download the MyChart app! Go to the app store, search "MyChart", open the app, select [facility], and log in with your MyChart username and password.

## 2024-01-15 NOTE — Progress Notes (Signed)
 [facility] [phone]) [phone]   Fax:(336) [phone]  OFFICE PROGRESS NOTE  [name], MD [address] [place] [address]  DIAGNOSIS: Stage IV (T2a, N0, M1b) non-small cell lung cancer, adenocarcinoma with negative EGFR and ALK mutations diagnosed in January 2015 and presented with right upper lobe lung mass in addition to a solitary brain metastasis.  PRIOR THERAPY: 1) Status post stereotactic radiotherapy to a solitary right parietal brain lesion under the care of Dr. Jeryl Moris on 10/16/2013. 2) Status post palliative radiotherapy to the right lung tumor under the care of Dr. Jeryl Moris completed on 12/05/2013. 3) Systemic chemotherapy with carboplatin  for AUC of 5 and Alimta  500 mg/M2 every 3 weeks. First dose Jan 06 2014. Status post 6 cycles.  CURRENT THERAPY: Systemic chemotherapy with maintenance Alimta  500 MG/M2 every 3 weeks, status post 165 cycles.  INTERVAL HISTORY: Alexis [name] 73 y.o. female returns to the clinic today for follow-up visit accompanied by her husband Alexis [name]. Discussed the use of AI scribe software for clinical note transcription with the patient, who gave verbal consent to proceed.  History of Present Illness   Alexis [name] is a 73 year old female who presents for evaluation before cycle number 166 of maintenance treatment with Alimta . She is accompanied by her husband, Alexis [name].  She has Stage IV (T2a, N0, M1b) non-small cell lung cancer, adenocarcinoma with negative EGFR and ALK mutations diagnosed in January 2015 and presented with right upper lobe lung mass in addition to a solitary brain metastasis. She is currently undergoing maintenance treatment with single-agent Alimta  and has completed 165 cycles. She is scheduled for cycle number 166.  She feels 'top of the world' and has not experienced any nausea or vomiting following her last treatment. She reports a 'little bit' of fatigue but denies any chest pain or breathing  issues.  She is scheduled for an MRI of the brain next week.          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  MEDICATIONS:  Current Outpatient Medications  Medication Sig Dispense Refill   acetaminophen  (TYLENOL ) 500 MG tablet Take 500 mg by mouth every 6 (six) hours as needed for mild pain or headache. Reported on 11/02/2015     Ascorbic Acid (VITAMIN C GUMMIE PO) Take 1 each by mouth every morning.     aspirin  EC 81 MG tablet Take 1 tablet (81 mg total) by mouth daily. 150 tablet 2   Cranberry 240 MG CAPS Take 1 capsule by mouth daily. This is in her probiotic     [employer] ACID CONTROLLER 10 MG tablet SMARTSIG:1 Tablet(s) By Mouth Every 12 Hours PRN     dexamethasone  (DECADRON ) 4 MG tablet TAKE 1 TABLET TWICE A DAY THE DAY BEFORE, THE DAY OF, AND THE DAY AFTER CHEMOTHERAPY. 40 tablet 2   estradiol (ESTRACE) 0.1 MG/GM vaginal cream Place 1 Applicatorful vaginally daily.     folic acid  (FOLVITE ) 1 MG tablet TAKE 1 TABLET BY MOUTH EVERY DAY  90 tablet 0   glimepiride  (AMARYL ) 2 MG tablet TAKE 1 TABLET BY MOUTH DAILY BEFORE BREAKFAST. 90 tablet 0   loratadine (CLARITIN) 10 MG tablet Take 10 mg by mouth daily.     Multiple Vitamin (MULTIVITAMIN WITH MINERALS) TABS tablet Take 1 tablet by mouth every morning.     omeprazole  (PRILOSEC) 20 MG capsule TAKE 1 CAPSULE BY MOUTH EVERY DAY 90 capsule 0   ondansetron  (ZOFRAN ) 8 MG tablet TAKE 1 TABLET BY MOUTH BEFORE CHEMO 30 tablet 1   phenazopyridine  (PYRIDIUM ) 200 MG tablet Take 1 tablet (200 mg total) by mouth every 8 (eight) hours as  needed. 10 tablet 0   prochlorperazine  (COMPAZINE ) 10 MG tablet Take 1 tablet (10 mg total) by mouth every 6 (six) hours as needed for nausea or vomiting. 60 tablet 0   rosuvastatin  (CRESTOR ) 10 MG tablet Take 1 tablet (10 mg total) by mouth daily. 30 tablet 12   senna-docusate (SENOKOT-S) 8.6-50 MG tablet Take 1 tablet by mouth daily. 30 tablet prn   temazepam  (RESTORIL ) 15 MG capsule Take 1 capsule (15 mg total) by mouth at bedtime as needed for sleep. 30 capsule 0   trimethoprim  (TRIMPEX ) 100 MG tablet Take 100 mg by mouth daily.     Vitamin D, Ergocalciferol, (DRISDOL) 1.25 MG (50000 UNIT) CAPS capsule Take 50,000 Units by mouth once a week. Vit d 3     No current facility-administered medications for this visit.    SURGICAL HISTORY:  Past Surgical History:  Procedure Laterality Date   ABDOMINAL HYSTERECTOMY     COLOSTOMY TAKEDOWN N/A 07/10/2014   Procedure: LAPAROSCOPIC LYSIS OF ADHESIONS (90 MIN) LAPAROSCOPIC ASSISTED COLOSTOMY CLOSURE, RIGID PROCTOSIGMOIDOSCOPY;  Surgeon: Adalberto Hollow, MD;  Location: [facility];  Service: General;  Laterality: N/A;   LAPAROTOMY N/A 11/03/2013   Procedure: EXPLORATORY LAPAROTOMY, DRAINAGE OF INTRA  ABDOMINAL ABSCESSES, MOBILIZATION OF SPLENIC FLEXURE, SIGMOID COLECTOMY WITH COLOSTOMY;  Surgeon: Harlee Lichtenstein, MD;  Location: [facility];  Service: General;  Laterality: N/A;   VIDEO BRONCHOSCOPY Bilateral 08/30/2013   Procedure: VIDEO BRONCHOSCOPY WITH FLUORO;  Surgeon: Diamond Formica, MD;  Location: [facility];  Service: Cardiopulmonary;  Laterality: Bilateral;    REVIEW OF SYSTEMS:  A comprehensive review of systems was negative except for: Constitutional: positive for fatigue   PHYSICAL EXAMINATION: General appearance: alert, cooperative, and no distress Head: Normocephalic, without obvious abnormality, atraumatic Neck: no adenopathy, no JVD, supple, symmetrical, trachea midline, and thyroid  not enlarged, symmetric, no tenderness/mass/nodules Lymph  nodes: Cervical, supraclavicular, and axillary nodes normal. Resp: clear to auscultation bilaterally Back: symmetric, no curvature. ROM normal. No CVA tenderness. Cardio: regular rate and rhythm, S1, S2 normal, no murmur, click, rub or gallop GI: soft, non-tender; bowel sounds normal; no masses,  no organomegaly Extremities: extremities normal, atraumatic, no cyanosis or edema   ECOG PERFORMANCE STATUS: 1 - Symptomatic but completely ambulatory   Blood pressure 133/62, pulse 98, temperature 98.1 F (36.7 C), temperature source Temporal, resp. rate 17, height 5\' 4"  (1.626 m), weight 167 lb 8 oz (76 kg), SpO2 100%.  LABORATORY DATA: Lab Results  Component Value Date   WBC 8.9 01/15/2024   HGB 11.5 (L) 01/15/2024   HCT 35.5 (L) 01/15/2024   MCV 99.2 01/15/2024   PLT 297 01/15/2024      Chemistry      Component Value Date/Time   NA 138 12/25/2023 0905   NA 140 11/03/2022 0844   NA 139 08/14/2017 0837   K 4.5 12/25/2023 1610  K 4.4 08/14/2017 0837   CL 105 12/25/2023 0905   CO2 25 12/25/2023 0905   CO2 24 08/14/2017 0837   BUN 24 (H) 12/25/2023 0905   BUN 33 (H) 11/03/2022 0844   BUN 13.3 08/14/2017 0837   CREATININE 0.92 12/25/2023 0905   CREATININE 0.8 08/14/2017 0837      Component Value Date/Time   CALCIUM  9.5 12/25/2023 0905   CALCIUM  9.3 08/14/2017 0837   ALKPHOS 84 12/25/2023 0905   ALKPHOS 107 08/14/2017 0837   AST 12 (L) 12/25/2023 0905   AST 12 08/14/2017 0837   ALT 10 12/25/2023 0905   ALT 12 08/14/2017 0837   BILITOT 0.4 12/25/2023 0905   BILITOT 0.37 08/14/2017 0837       RADIOGRAPHIC STUDIES: No results found.    ASSESSMENT AND PLAN:  This is a very pleasant 73 years old white female with metastatic non-small cell lung cancer, adenocarcinoma status post induction systemic chemotherapy with carboplatin  and Alimta  with partial response.  The patient has no actionable EGFR or ALK mutations at the time of her diagnosis. The patient is currently on  maintenance treatment with single agent Alimta  status post 165 cycles.  She has been tolerating this treatment fairly well with no concerning adverse effects. Assessment and Plan    Stage IV (T2a, N0, M1b) non-small cell lung cancer, adenocarcinoma with negative EGFR and ALK mutations diagnosed in January 2015 and presented with right upper lobe lung mass in addition to a solitary brain metastasis. She is undergoing maintenance treatment with single-agent Alimta , currently on cycle 165. Reports mild fatigue as the only side effect. Hemoglobin is well-managed at 11.5. No concerning findings in recent lab work. Scheduled for MRI of the brain next week to monitor disease status. Plan to perform a follow-up scan 7-10 days before the next appointment to assess treatment efficacy and disease progression. - Administer cycle 166 of Alimta  chemotherapy - Order MRI of the brain next week - Schedule follow-up scan 7-10 days before the next appointment    For the anemia, the patient will continue on the oral iron tablets. For the insomnia, I will give her a refill of Restoril  15 mg p.o. nightly The patient was advised to call immediately if she has any other concerning symptoms in the interval. The patient voices understanding of current disease status and treatment options and is in agreement with the current care plan. All questions were answered. The patient knows to call the clinic with any problems, questions or concerns. We can certainly see the patient much sooner if necessary.  Disclaimer: This note was dictated with voice recognition software. Similar sounding words can inadvertently be transcribed and may not be corrected upon review.

## 2024-01-16 ENCOUNTER — Other Ambulatory Visit: Payer: Self-pay

## 2024-01-18 ENCOUNTER — Other Ambulatory Visit: Payer: Self-pay

## 2024-01-23 ENCOUNTER — Ambulatory Visit
Admission: RE | Admit: 2024-01-23 | Discharge: 2024-01-23 | Disposition: A | Discharge status: Home / Self Care | Payer: Medicare Other | Source: Ambulatory Visit | Attending: Radiation Oncology | Admitting: Radiation Oncology

## 2024-01-23 DIAGNOSIS — C7931 Secondary malignant neoplasm of brain: Secondary | ICD-10-CM

## 2024-01-23 DIAGNOSIS — C3490 Malignant neoplasm of unspecified part of unspecified bronchus or lung: Secondary | ICD-10-CM | POA: Diagnosis not present

## 2024-01-23 MED ORDER — GADOPICLENOL 0.5 MMOL/ML IV SOLN
7.5000 mL | Freq: Once | INTRAVENOUS | Status: AC | PRN
Start: 2024-01-23 — End: 2024-01-23
  Administered 2024-01-23: 7.5 mL via INTRAVENOUS

## 2024-01-25 ENCOUNTER — Other Ambulatory Visit: Payer: Self-pay

## 2024-01-26 ENCOUNTER — Ambulatory Visit (HOSPITAL_COMMUNITY)
Admission: RE | Admit: 2024-01-26 | Discharge: 2024-01-26 | Disposition: A | Discharge status: Home / Self Care | Source: Ambulatory Visit | Attending: Internal Medicine | Admitting: Internal Medicine

## 2024-01-26 ENCOUNTER — Encounter: Payer: Self-pay | Admitting: Radiation Oncology

## 2024-01-26 DIAGNOSIS — C3490 Malignant neoplasm of unspecified part of unspecified bronchus or lung: Secondary | ICD-10-CM | POA: Diagnosis not present

## 2024-01-26 DIAGNOSIS — I700 Atherosclerosis of aorta: Secondary | ICD-10-CM | POA: Diagnosis not present

## 2024-01-26 MED ORDER — SODIUM CHLORIDE (PF) 0.9 % IJ SOLN
INTRAMUSCULAR | Status: AC
  Filled 2024-01-26: qty 50

## 2024-01-26 MED ORDER — IOHEXOL 300 MG/ML  SOLN
100.0000 mL | Freq: Once | INTRAMUSCULAR | Status: AC | PRN
  Administered 2024-01-26: 100 mL via INTRAVENOUS

## 2024-01-26 NOTE — Progress Notes (Addendum)
 Telephone nursing appointment for review of most recent MRI results. I verified patient's identity x2 and began nursing interview.    Patient states issues as follows...   Fatigue: Denies Weakness or loss of control of the extremities: Denies Headache: Denies Seizure: Denies Vision: Denies Memory: Denies Confusion: Denies Skin: Denies Cardiac: Denies Lungs: Denies Dexamethasone : Yes- 1 tab BID PO the day before, day of, and day after chemotherapy.   Patient denies any other related issues at this time.   Meaningful use complete.   Patient aware of their telephone appointment w/ Allana Ishikawa PA-C. I left my extension [phone] in case patient needs anything. Patient verbalized understanding. This concludes the nursing interview.   Patient preferred phone # [phone]    Avery Bodo, LPN

## 2024-01-29 ENCOUNTER — Ambulatory Visit
Admission: RE | Admit: 2024-01-29 | Discharge: 2024-01-29 | Disposition: A | Discharge status: Home / Self Care | Payer: Medicare Other | Source: Ambulatory Visit | Attending: Radiation Oncology | Admitting: Radiation Oncology

## 2024-01-29 ENCOUNTER — Other Ambulatory Visit: Payer: Self-pay

## 2024-01-29 VITALS — Ht 64.5 in | Wt 165.0 lb

## 2024-01-29 DIAGNOSIS — C7931 Secondary malignant neoplasm of brain: Secondary | ICD-10-CM | POA: Diagnosis not present

## 2024-01-29 DIAGNOSIS — C3411 Malignant neoplasm of upper lobe, right bronchus or lung: Secondary | ICD-10-CM

## 2024-01-29 NOTE — Progress Notes (Signed)
 Radiation Oncology         [phone] ________________________________  Outpatient Follow Up- Conducted via telephone at patient request.  I spoke with the patient to conduct this consult visit via telephone. The patient was notified in advance and was offered an in person or telemedicine meeting to allow for face to face communication but instead preferred to proceed with a telephone visit.  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  Prior Radiation:    10/28/2013 through 12/05/2013:   The patient was treated to the right lung tumor to a dose of 50 gray in 25 fractions using a 3-D conformal technique. Daily image guidance was used for the patient's treatment.  10/16/2013 SRS Treatment: PTV1: Rt Parietal 9mm target was treated using 3 Arcs to a prescription dose of 20 Gy. ExacTrac Snap verification was performed for each couch angle.  Narrative:     In summary this is a 73 y.o. patient with a history of metastatic lung cancer to the brain who was treated in two sessions in 2015 for her brain disease, followed by local control to the right lung.  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 She is no longer taking these medications.  She remains on maintenance Alimta  with Dr. Marguerita Shih every 3 weeks and has had a total of 166 cycles.   Her last MRI brain on 01/23/24 showed stable changes of prior therapy and no new disease. She's contacted today to review these results.    ROS: The patient is doing quite well and feels like her last chemo infusion went so smoothly and she didn't have any trouble with it. She is happy that she's adopted another shitzu puppy and is staying active with this addition to  her family. No complaints of  headaches, visual or auditory changes or concerns with movement or dizziness are verbalized.   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   Diabetes mellitus without complication [facility])    patient states she has type 2   Encounter for antineoplastic chemotherapy 07/20/2015   Malignant neoplasm of right upper lobe of lung (HCC)     non small cell lung cancer adenocarcioma with brain meta    Past Surgical History: Past Surgical History:  Procedure Laterality Date   ABDOMINAL HYSTERECTOMY     COLOSTOMY TAKEDOWN N/A 07/10/2014   Procedure: LAPAROSCOPIC LYSIS OF ADHESIONS (90 MIN) LAPAROSCOPIC ASSISTED COLOSTOMY CLOSURE, RIGID PROCTOSIGMOIDOSCOPY;  Surgeon: Adalberto Hollow, MD;  Location: [facility];  Service: General;  Laterality: N/A;   LAPAROTOMY N/A 11/03/2013   Procedure: EXPLORATORY LAPAROTOMY, DRAINAGE OF INTRA  ABDOMINAL ABSCESSES, MOBILIZATION OF SPLENIC FLEXURE, SIGMOID COLECTOMY WITH COLOSTOMY;  Surgeon: Harlee Lichtenstein, MD;  Location: [facility];  Service: General;  Laterality: N/A;   VIDEO BRONCHOSCOPY Bilateral 08/30/2013   Procedure: VIDEO BRONCHOSCOPY WITH FLUORO;  Surgeon: Diamond Formica, MD;  Location: [facility];  Service: Cardiopulmonary;  Laterality: Bilateral;    Social History:  Social History   Socioeconomic History   Marital status: Married    Spouse name: Not on file   Number of  children: Not on file   Years of education: Not on file   Highest education level: Not on file  Occupational History   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  Tobacco Use   Smoking status: Former    Current packs/day: 0.00    Average packs/day: 1 pack/day for 40.0 years (40.0 ttl pk-yrs)    Types: Cigarettes    Start date: 09/27/1973    Quit date: 09/27/2013    Years since quitting: 10.3   Smokeless tobacco: Never  Vaping Use    Vaping status: Never Used  Substance and Sexual Activity   Alcohol use: No   Drug use: No   Sexual activity: Yes  Other Topics Concern   Not on file  Social History Narrative   Not on file   Social Drivers of Health   Financial Resource Strain: Low Risk  (11/10/2022)   Overall Financial Resource Strain (CARDIA)    Difficulty of Paying Living Expenses: Not hard at all  Food Insecurity: No Food Insecurity (01/26/2024)   Hunger Vital Sign    Worried About Running Out of Food in the Last Year: Never true    Ran Out of Food in the Last Year: Never true  Transportation Needs: No Transportation Needs (01/26/2024)   PRAPARE - Administrator, Civil Service (Medical): No    Lack of Transportation (Non-Medical): No  Physical Activity: Insufficiently Active (11/10/2022)   Exercise Vital Sign    Days of Exercise per Week: 1 day    Minutes of Exercise per Session: 20 min  Stress: Stress Concern Present (11/10/2022)   [employer] of Occupational Health - Occupational Stress Questionnaire    Feeling of Stress : To some extent  Social Connections: Moderately Isolated (11/10/2022)   Social Connection and Isolation Panel [NHANES]    Frequency of Communication with Friends and Family: Once a week    Frequency of Social Gatherings with Friends and Family: Once a week    Attends Religious Services: More than 4 times per year    Active Member of [employer] or Organizations: No    Attends Banker Meetings: Never    Marital Status: Married  Catering manager Violence: Not At Risk (01/26/2024)   Humiliation, Afraid, Rape, and Kick questionnaire    Fear of Current or Ex-Partner: No    Emotionally Abused: No    Physically Abused: No    Sexually Abused: No  The patient is married. She lives in [place], [place] .  Family History: Family History  Problem Relation Age of Onset   Emphysema Father        smoked   Lung cancer Father        smoked   Cancer Mother     Hypertension Mother    COPD Mother     ALLERGIES:  is allergic to glimepiride  and codeine.  Meds: Current Outpatient Medications  Medication Sig Dispense Refill   acetaminophen  (TYLENOL ) 500 MG tablet Take 500 mg by mouth every 6 (six) hours as needed for mild pain or headache. Reported on 11/02/2015     Ascorbic Acid (VITAMIN C GUMMIE PO) Take 1 each by mouth every morning.     aspirin  EC 81 MG tablet Take 1 tablet (81 mg total) by mouth daily. 150 tablet 2   Cranberry 240 MG CAPS Take 1 capsule by mouth daily. This is in her probiotic     [employer] ACID CONTROLLER 10 MG tablet SMARTSIG:1 Tablet(s) By Mouth Every 12 Hours PRN  dexamethasone  (DECADRON ) 4 MG tablet TAKE 1 TABLET TWICE A DAY THE DAY BEFORE, THE DAY OF, AND THE DAY AFTER CHEMOTHERAPY. 40 tablet 2   estradiol (ESTRACE) 0.1 MG/GM vaginal cream Place 1 Applicatorful vaginally daily.     folic acid  (FOLVITE ) 1 MG tablet TAKE 1 TABLET BY MOUTH EVERY DAY 90 tablet 0   glimepiride  (AMARYL ) 2 MG tablet TAKE 1 TABLET BY MOUTH DAILY BEFORE BREAKFAST. 90 tablet 0   loratadine (CLARITIN) 10 MG tablet Take 10 mg by mouth daily.     Multiple Vitamin (MULTIVITAMIN WITH MINERALS) TABS tablet Take 1 tablet by mouth every morning.     omeprazole  (PRILOSEC) 20 MG capsule TAKE 1 CAPSULE BY MOUTH EVERY DAY 90 capsule 0   ondansetron  (ZOFRAN ) 8 MG tablet TAKE 1 TABLET BY MOUTH BEFORE CHEMO 30 tablet 1   phenazopyridine  (PYRIDIUM ) 200 MG tablet Take 1 tablet (200 mg total) by mouth every 8 (eight) hours as needed. 10 tablet 0   prochlorperazine  (COMPAZINE ) 10 MG tablet Take 1 tablet (10 mg total) by mouth every 6 (six) hours as needed for nausea or vomiting. 60 tablet 0   rosuvastatin  (CRESTOR ) 10 MG tablet Take 1 tablet (10 mg total) by mouth daily. 30 tablet 12   senna-docusate (SENOKOT-S) 8.6-50 MG tablet Take 1 tablet by mouth daily. 30 tablet prn   temazepam  (RESTORIL ) 15 MG capsule Take 1 capsule (15 mg total) by mouth at bedtime as needed for  sleep. 30 capsule 0   trimethoprim  (TRIMPEX ) 100 MG tablet Take 100 mg by mouth daily.     Vitamin D, Ergocalciferol, (DRISDOL) 1.25 MG (50000 UNIT) CAPS capsule Take 50,000 Units by mouth once a week. Vit d 3     No current facility-administered medications for this encounter.    Physical Findings: Unable to assess due to type of encounter  Lab Findings: Lab Results  Component Value Date   WBC 8.9 01/15/2024   HGB 11.5 (L) 01/15/2024   HCT 35.5 (L) 01/15/2024   MCV 99.2 01/15/2024   PLT 297 01/15/2024     Radiographic Findings: CT ABDOMEN PELVIS W CONTRAST Result Date: 01/26/2024 EXAMINATION: CT ABDOMEN PELVIS W CONTRAST CLINICAL INDICATION: Female, 73 years old. Non-small cell lung cancer (NSCLC), staging TECHNIQUE: Axial CT of the abdomen and pelvis with 100 cc Omnipaque  300 intravenous contrast. Multiplanar reformations provided. Unless otherwise specified, incidental thyroid , adrenal, renal lesions do not require dedicated imaging follow up. Additionally, any mentioned pulmonary nodules do not require dedicated imaging follow-up based on the Fleischner guidelines unless otherwise specified. Coronary calcifications are not identified unless otherwise specified. COMPARISON: 11/06/2023 FINDINGS: Regarding findings of the lung bases, please refer to the chest report. The liver appears normal. The gallbladder is normal. The spleen is normal. Pancreas is normal. The adrenals are normal. The kidneys are normal. The abdominal aorta is normal in caliber. Scattered atherosclerotic changes are present. The urinary bladder is normal. The uterus is surgically absent. Partial colectomy noted. Large and small bowel loops are otherwise within normal limits. There is no free fluid or lymphadenopathy. No concerning osseous lesions. There is diffuse osseous demineralization with degenerative changes of the spine and bony pelvis. IMPRESSION: No evidence for metastatic disease within the abdomen or pelvis.  DOSE REDUCTION: This exam was performed according to our departmental dose-optimization program which includes automated exposure control, adjustment of the mA and/or kV according to patient size and/or use of iterative reconstruction technique. Electronically signed by: [place] Engel MD 01/26/2024 05:36 PM EDT RP Workstation:  [id number]   MR Brain W Wo Contrast Result Date: 01/23/2024 CLINICAL DATA:  73 year old female with treated metastatic lung cancer. Restaging. EXAM: MRI HEAD WITHOUT AND WITH CONTRAST TECHNIQUE: Multiplanar, multiecho pulse sequences of the brain and surrounding structures were obtained without and with intravenous contrast. CONTRAST:  7.5 mL Vueway  COMPARISON:  Brain MRI 07/25/2023 and earlier. FINDINGS: Brain: Stable patchy enhancing right lateral parietal convexity lesion on series 13, image 105. Regional T2 and FLAIR hyperintensity stable. No significant regional mass effect. No other suspicious intracranial enhancement. Widespread bilateral cerebral white matter T2 and FLAIR hyperintensity elsewhere appears stable and nonspecific. Stable mild hemosiderin limited to the residual right parietal lesion. Stable gray and white matter signal otherwise. No superimposed restricted diffusion to suggest acute infarction. No midline shift, ventriculomegaly, extra-axial collection or acute intracranial hemorrhage. Cervicomedullary junction and pituitary are within normal limits. Vascular: Major intracranial vascular flow voids are preserved. Following contrast the major dural venous sinuses appear to be enhancing and patent. Skull and upper cervical spine: Negative for age visible cervical spine. Visible bone marrow signal stable and within normal limits. Sinuses/Orbits: Stable, negative. Other: Mastoids are well aerated. Visible internal auditory structures appear normal. IMPRESSION: 1. Stable treated right parietal metastasis. 2. No new metastatic disease or acute intracranial abnormality.  Electronically Signed   By: Marlise Simpers M.D.   On: 01/23/2024 13:13     Impression/Plan: 1. Stage IV, cT2aN0M1b, NSCLC of the RUL consistent with adenocarcinoma with metastasis to the brain. The patient continues to clinically and radiographically be without disease in the brain. She will continue systemic therapy with Dr. Marguerita Shih. We will plan to continue our surveillance schedule with brain MRI in 6 months time.      This encounter was conducted via telephone.  The patient has provided two factor identification and has given verbal consent for this type of encounter and has been advised to only accept a meeting of this type in a secure network environment. The time spent during this encounter was 35 minutes including preparation, discussion, and coordination of the patient's care. The attendants for this meeting include  Bettejane Brownie  and Madelynn Schilder and her husband Laconya Clere.  During the encounter, Bettejane Brownie was located remotely at home.  Madelynn Schilder was located at home with her husband Shontavia Mickel.       Shelvia Dick, PAC

## 2024-01-30 NOTE — Progress Notes (Signed)
 [facility] OFFICE PROGRESS NOTE  Alexis Pintos, MD [address] [place] [address]  DIAGNOSIS: Stage IV (T2a, N0, M1b) non-small cell lung cancer, adenocarcinoma with negative EGFR and ALK mutations diagnosed in January 2015 and presented with right upper lobe lung mass in addition to a solitary brain metastasis   PRIOR THERAPY: 1) Status post stereotactic radiotherapy to a solitary right parietal brain lesion under the care of Dr. Jeryl [name] on 10/16/2013. 2) Status post palliative radiotherapy to the right lung tumor under the care of Dr. Jeryl [name] completed on 12/05/2013. 3) Systemic chemotherapy with carboplatin  for AUC of 5 and Alimta  500 mg/M2 every 3 weeks. First dose Jan 06 2014. Status post 6 cycles  CURRENT THERAPY: Systemic chemotherapy with maintenance Alimta  500 MG/M2 every 3 weeks, status post 166 cycles   INTERVAL HISTORY: Alexis [name] 73 y.o. female returns to the clinic today for a follow-up visit. She was last seen by Dr. Marguerita [name] 3 weeks ago. She is undergoing chemotherapy with Alimta  and tolerates it well. She has been on alimta  several years. She is doing well today. She takes restoril  for insomnia. She needs a refill.   She experiences back pain located in her back and side, which she wonders might be related to scar tissue from previous surgeries (abdominal surgeries) or arthritis. Recent scans have shown arthritis and degenerative changes in her spine.   She is expected to see her urologist soon and they want to arrange for cystoscopy.   She denies any fevers, chills, or night sweats.  She denies any unexplained weight loss.  She denies any current chest pain, significant cough, or hemoptysis. She has dyspnea on exertion which is stable. She has to rest. She denies any nausea or vomiting.  Denies any diarrhea. She does experience intermittent constipation. Denies any visual changes at this time. She follows closely with Dr. Jeryl [name] regarding her history of  metastatic disease to the brain. She saw them recently last week and everything was stable. She recently had a restaging CT scan performed. She is here today for evaluation and repeat blood work before undergoing cycle 167.     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  MEDICATIONS:  Current Outpatient Medications  Medication Sig Dispense Refill   acetaminophen  (TYLENOL ) 500 MG tablet Take 500 mg by mouth every 6 (six) hours as needed for mild pain or headache. Reported on 11/02/2015     Ascorbic Acid (VITAMIN C GUMMIE PO) Take 1 each by mouth every morning.     aspirin  EC 81 MG tablet Take 1 tablet (81 mg total) by mouth daily. 150 tablet 2   Cranberry 240 MG CAPS Take 1 capsule by mouth daily. This is in her probiotic     [employer] ACID CONTROLLER 10 MG tablet SMARTSIG:1 Tablet(s) By Mouth Every 12 Hours PRN     dexamethasone  (DECADRON ) 4 MG tablet TAKE 1 TABLET TWICE A DAY THE DAY BEFORE, THE DAY OF, AND THE DAY AFTER CHEMOTHERAPY. 40 tablet 2   estradiol (  ESTRACE) 0.1 MG/GM vaginal cream Place 1 Applicatorful vaginally daily.     folic acid  (FOLVITE ) 1 MG tablet TAKE 1 TABLET BY MOUTH EVERY DAY 90 tablet 0   glimepiride  (AMARYL ) 2 MG tablet TAKE 1 TABLET BY MOUTH DAILY BEFORE BREAKFAST. 90 tablet 0   loratadine (CLARITIN) 10 MG tablet Take 10 mg by mouth daily.     Multiple Vitamin (MULTIVITAMIN WITH MINERALS) TABS tablet Take 1 tablet by mouth every morning.     omeprazole  (PRILOSEC) 20 MG capsule TAKE 1 CAPSULE BY MOUTH EVERY DAY 90 capsule 0   ondansetron  (ZOFRAN ) 8 MG tablet  TAKE 1 TABLET BY MOUTH BEFORE CHEMO 30 tablet 1   phenazopyridine  (PYRIDIUM ) 200 MG tablet Take 1 tablet (200 mg total) by mouth every 8 (eight) hours as needed. 10 tablet 0   prochlorperazine  (COMPAZINE ) 10 MG tablet Take 1 tablet (10 mg total) by mouth every 6 (six) hours as needed for nausea or vomiting. 60 tablet 0   rosuvastatin  (CRESTOR ) 10 MG tablet Take 1 tablet (10 mg total) by mouth daily. 30 tablet 12   senna-docusate (SENOKOT-S) 8.6-50 MG tablet Take 1 tablet by mouth daily. 30 tablet prn   temazepam  (RESTORIL ) 15 MG capsule Take 1 capsule (15 mg total) by mouth at bedtime as needed for sleep. 30 capsule 0   trimethoprim  (TRIMPEX ) 100 MG tablet Take 100 mg by mouth daily.     Vitamin D, Ergocalciferol, (DRISDOL) 1.25 MG (50000 UNIT) CAPS capsule Take 50,000 Units by mouth once a week. Vit d 3     No current facility-administered medications for this visit.    SURGICAL HISTORY:  Past Surgical History:  Procedure Laterality Date   ABDOMINAL HYSTERECTOMY     COLOSTOMY TAKEDOWN N/A 07/10/2014   Procedure: LAPAROSCOPIC LYSIS OF ADHESIONS (90 MIN) LAPAROSCOPIC ASSISTED COLOSTOMY CLOSURE, RIGID PROCTOSIGMOIDOSCOPY;  Surgeon: Alexis Hollow, MD;  Location: [facility];  Service: General;  Laterality: N/A;   LAPAROTOMY N/A 11/03/2013   Procedure: EXPLORATORY LAPAROTOMY, DRAINAGE OF INTRA  ABDOMINAL ABSCESSES, MOBILIZATION OF SPLENIC FLEXURE, SIGMOID COLECTOMY WITH COLOSTOMY;  Surgeon: Alexis Lichtenstein, MD;  Location: [facility];  Service: General;  Laterality: N/A;   VIDEO BRONCHOSCOPY Bilateral 08/30/2013   Procedure: VIDEO BRONCHOSCOPY WITH FLUORO;  Surgeon: Alexis Formica, MD;  Location: [facility];  Service: Cardiopulmonary;  Laterality: Bilateral;    REVIEW OF SYSTEMS:   Constitutional: Negative for appetite change, chills, fatigue, fever and unexpected weight change.  HENT: Negative for mouth sores, nosebleeds, sore throat and trouble swallowing.   Eyes: Negative for eye problems and  icterus.  Respiratory: Positive for dyspnea on exertion. Negative for cough, hemoptysis, and wheezing.   Cardiovascular: Negative for chest pain and leg swelling.  Gastrointestinal: Improved constipation. Negative for abdominal pain, diarrhea, nausea and vomiting.  Genitourinary: Negative for bladder incontinence, difficulty urinating, dysuria, frequency and hematuria.   Musculoskeletal: Positive for right sided back pain. Negative for gait problem, neck pain and neck stiffness.  Skin: Negative for itching and rash.  Neurological: Negative for dizziness, extremity weakness, gait problem, headaches, light-headedness and seizures.  Hematological: Negative for adenopathy. Does not bruise/bleed easily.  Psychiatric/Behavioral: Improved insomnia. Negative for confusion, depression and sleep disturbance. The patient is not nervous/anxious.    PHYSICAL EXAMINATION:  There were no vitals taken for this visit.  ECOG PERFORMANCE STATUS: 1  Physical Exam  Constitutional: Oriented to person, place, and time and well-developed, well-nourished, and in no distress.  HENT:  Head: Normocephalic and atraumatic.  Mouth/Throat: Oropharynx is  clear and moist. No oropharyngeal exudate.  Eyes: Conjunctivae are normal. Right eye exhibits no discharge. Left eye exhibits no discharge. No scleral icterus.  Neck: Normal range of motion. Neck supple.  Cardiovascular: Normal rate, regular rhythm, normal heart sounds and intact distal pulses.   Pulmonary/Chest: Effort normal and breath sounds normal. No respiratory distress. No wheezes. No rales.  Abdominal: Soft. Bowel sounds are normal. Exhibits no distension and no mass. There is no tenderness.  Musculoskeletal: Normal range of motion. Exhibits no edema. Some tenderness to palpation over the right paravertebral musculature.  Lymphadenopathy:    No cervical adenopathy.  Neurological: Alert and oriented to person, place, and time. Exhibits normal muscle tone. Gait  normal. Coordination normal.  Skin: Skin is warm and dry. No rash noted. Not diaphoretic. No erythema. No pallor. Mild bruising on upper extremities.  Psychiatric: Mood, memory and judgment normal.  Vitals reviewed.  LABORATORY DATA: Lab Results  Component Value Date   WBC 8.9 01/15/2024   HGB 11.5 (L) 01/15/2024   HCT 35.5 (L) 01/15/2024   MCV 99.2 01/15/2024   PLT 297 01/15/2024      Chemistry      Component Value Date/Time   NA 139 01/15/2024 0857   NA 140 11/03/2022 0844   NA 139 08/14/2017 0837   K 4.1 01/15/2024 0857   K 4.4 08/14/2017 0837   CL 106 01/15/2024 0857   CO2 22 01/15/2024 0857   CO2 24 08/14/2017 0837   BUN 23 01/15/2024 0857   BUN 33 (H) 11/03/2022 0844   BUN 13.3 08/14/2017 0837   CREATININE 1.02 (H) 01/15/2024 0857   CREATININE 0.8 08/14/2017 0837      Component Value Date/Time   CALCIUM  9.5 01/15/2024 0857   CALCIUM  9.3 08/14/2017 0837   ALKPHOS 78 01/15/2024 0857   ALKPHOS 107 08/14/2017 0837   AST 10 (L) 01/15/2024 0857   AST 12 08/14/2017 0837   ALT 10 01/15/2024 0857   ALT 12 08/14/2017 0837   BILITOT 0.5 01/15/2024 0857   BILITOT 0.37 08/14/2017 0837       RADIOGRAPHIC STUDIES:  CT ABDOMEN PELVIS W CONTRAST Result Date: 01/26/2024 EXAMINATION: CT ABDOMEN PELVIS W CONTRAST CLINICAL INDICATION: Female, 73 years old. Non-small cell lung cancer (NSCLC), staging TECHNIQUE: Axial CT of the abdomen and pelvis with 100 cc Omnipaque  300 intravenous contrast. Multiplanar reformations provided. Unless otherwise specified, incidental thyroid , adrenal, renal lesions do not require dedicated imaging follow up. Additionally, any mentioned pulmonary nodules do not require dedicated imaging follow-up based on the Fleischner guidelines unless otherwise specified. Coronary calcifications are not identified unless otherwise specified. COMPARISON: 11/06/2023 FINDINGS: Regarding findings of the lung bases, please refer to the chest report. The liver appears  normal. The gallbladder is normal. The spleen is normal. Pancreas is normal. The adrenals are normal. The kidneys are normal. The abdominal aorta is normal in caliber. Scattered atherosclerotic changes are present. The urinary bladder is normal. The uterus is surgically absent. Partial colectomy noted. Large and small bowel loops are otherwise within normal limits. There is no free fluid or lymphadenopathy. No concerning osseous lesions. There is diffuse osseous demineralization with degenerative changes of the spine and bony pelvis. IMPRESSION: No evidence for metastatic disease within the abdomen or pelvis. DOSE REDUCTION: This exam was performed according to our departmental dose-optimization program which includes automated exposure control, adjustment of the mA and/or kV according to patient size and/or use of iterative reconstruction technique. Electronically signed by: [place] Engel MD 01/26/2024 05:36 PM  EDT RP Workstation: [id number]   MR Brain W Wo Contrast Result Date: 01/23/2024 CLINICAL DATA:  73 year old female with treated metastatic lung cancer. Restaging. EXAM: MRI HEAD WITHOUT AND WITH CONTRAST TECHNIQUE: Multiplanar, multiecho pulse sequences of the brain and surrounding structures were obtained without and with intravenous contrast. CONTRAST:  7.5 mL Vueway  COMPARISON:  Brain MRI 07/25/2023 and earlier. FINDINGS: Brain: Stable patchy enhancing right lateral parietal convexity lesion on series 13, image 105. Regional T2 and FLAIR hyperintensity stable. No significant regional mass effect. No other suspicious intracranial enhancement. Widespread bilateral cerebral white matter T2 and FLAIR hyperintensity elsewhere appears stable and nonspecific. Stable mild hemosiderin limited to the residual right parietal lesion. Stable gray and white matter signal otherwise. No superimposed restricted diffusion to suggest acute infarction. No midline shift, ventriculomegaly, extra-axial collection or acute  intracranial hemorrhage. Cervicomedullary junction and pituitary are within normal limits. Vascular: Major intracranial vascular flow voids are preserved. Following contrast the major dural venous sinuses appear to be enhancing and patent. Skull and upper cervical spine: Negative for age visible cervical spine. Visible bone marrow signal stable and within normal limits. Sinuses/Orbits: Stable, negative. Other: Mastoids are well aerated. Visible internal auditory structures appear normal. IMPRESSION: 1. Stable treated right parietal metastasis. 2. No new metastatic disease or acute intracranial abnormality. Electronically Signed   By: Marlise Simpers M.D.   On: 01/23/2024 13:13     ASSESSMENT/PLAN:  This is a very pleasant 73 year old Caucasian female with stage IV non-small cell lung cancer, adenocarcinoma who presented with a right upper lobe lung mass in addition to a solitary brain metastatsis.  She was diagnosed in January 2015.    The patient had completed induction systemic chemotherapy with carboplatin  and Alimta  with a partial response.   The patient is currently being treated with single  agent maintenance Alimta .  She is status post 166 cycles.     Labs were reviewed. Recommend that she proceed with #167 today as scheduled.    The patient was seen with Dr. Marguerita [name] today.  Dr. Marguerita [name] personally and independently reviewed the scan and discussed results with the patient today.  The scan showed no evidence for disease progression.  Dr. Marguerita [name] recommends she proceed with treatment as scheduled.   We will see her back for follow-up visit in 3 weeks for evaluation repeat blood work before undergoing cycle #168   She will continue to take restoril  for her insomnia. I have refilled this.   Her scan does show degenerative changes in her spine which could cause back pain. She was advised to use tylenol  and salonpa patches or heating pads if needed. Her bones show demineralization. Encouraged her to  ensure she is getting dexa scans with her PCP and management of osteoporosis if found to meet criteria.   She was given a copy of her scan to share with Dr. Ranae Burrow at her upcoming appointment.   The patient was advised to call immediately if she has any concerning symptoms in the interval. The patient voices understanding of current disease status and treatment options and is in agreement with the current care plan. All questions were answered. The patient knows to call the clinic with any problems, questions or concerns. We can certainly see the patient much sooner if necessary  No orders of the defined types were placed in this encounter.    Mayla Biddy L Daleigh Pollinger, PA-C 01/30/24  ADDENDUM: Hematology/Oncology Attending: I had a face-to-face encounter with the patient today.  I reviewed her records, lab, scan and  recommended her care plan.  This is a very pleasant 73 years old white female with a stage IV non-small cell lung cancer, adenocarcinoma with no actionable mutations diagnosed in January 2015.  She started palliative systemic chemotherapy initially with carboplatin  and Alimta  for 6 cycles and starting from cycle #7 she has been on maintenance treatment with single agent Alimta  status post 166 cycles. The patient has been tolerating this treatment fairly well with no concerning adverse effects. She had repeat CT scan of the chest, abdomen pelvis performed recently.  I personally independently reviewed the scan and discussed the results with the patient and her husband.  Her scan showed no concerning findings for disease progression. I recommended for her to continue her maintenance treatment with Alimta  and she will proceed with cycle #167 today.  The patient was advised to call immediately if she has any concerning symptoms in the interval. The total time spent in the appointment was 30 minutes including review of chart and various tests results, discussions about plan of care and  coordination of care plan . Disclaimer: This note was dictated with voice recognition software. Similar sounding words can inadvertently be transcribed and may be missed upon review. Aurelio Blower, MD

## 2024-01-31 ENCOUNTER — Other Ambulatory Visit: Payer: Self-pay

## 2024-02-01 ENCOUNTER — Other Ambulatory Visit: Payer: Self-pay

## 2024-02-05 ENCOUNTER — Inpatient Hospital Stay

## 2024-02-05 ENCOUNTER — Inpatient Hospital Stay (HOSPITAL_BASED_OUTPATIENT_CLINIC_OR_DEPARTMENT_OTHER): Admitting: Physician Assistant

## 2024-02-05 ENCOUNTER — Inpatient Hospital Stay: Attending: Internal Medicine

## 2024-02-05 VITALS — BP 126/70 | HR 79 | Temp 97.2°F | Resp 15 | Wt 167.6 lb

## 2024-02-05 DIAGNOSIS — C7931 Secondary malignant neoplasm of brain: Secondary | ICD-10-CM | POA: Diagnosis not present

## 2024-02-05 DIAGNOSIS — Z5111 Encounter for antineoplastic chemotherapy: Secondary | ICD-10-CM | POA: Diagnosis not present

## 2024-02-05 DIAGNOSIS — C3411 Malignant neoplasm of upper lobe, right bronchus or lung: Secondary | ICD-10-CM

## 2024-02-05 DIAGNOSIS — K5900 Constipation, unspecified: Secondary | ICD-10-CM | POA: Diagnosis not present

## 2024-02-05 DIAGNOSIS — Z5112 Encounter for antineoplastic immunotherapy: Secondary | ICD-10-CM | POA: Diagnosis not present

## 2024-02-05 DIAGNOSIS — Z923 Personal history of irradiation: Secondary | ICD-10-CM | POA: Insufficient documentation

## 2024-02-05 DIAGNOSIS — Z79899 Other long term (current) drug therapy: Secondary | ICD-10-CM | POA: Insufficient documentation

## 2024-02-05 LAB — CBC WITH DIFFERENTIAL (CANCER CENTER ONLY)
Abs Immature Granulocytes: 0.04 10*3/uL (ref 0.00–0.07)
Basophils Absolute: 0 10*3/uL (ref 0.0–0.1)
Basophils Relative: 0 %
Eosinophils Absolute: 0 10*3/uL (ref 0.0–0.5)
Eosinophils Relative: 0 %
HCT: 33.8 % — ABNORMAL LOW (ref 36.0–46.0)
Hemoglobin: 11.1 g/dL — ABNORMAL LOW (ref 12.0–15.0)
Immature Granulocytes: 0 %
Lymphocytes Relative: 5 %
Lymphs Abs: 0.5 10*3/uL — ABNORMAL LOW (ref 0.7–4.0)
MCH: 32.2 pg (ref 26.0–34.0)
MCHC: 32.8 g/dL (ref 30.0–36.0)
MCV: 98 fL (ref 80.0–100.0)
Monocytes Absolute: 0.3 10*3/uL (ref 0.1–1.0)
Monocytes Relative: 4 %
Neutro Abs: 8.9 10*3/uL — ABNORMAL HIGH (ref 1.7–7.7)
Neutrophils Relative %: 91 %
Platelet Count: 306 10*3/uL (ref 150–400)
RBC: 3.45 MIL/uL — ABNORMAL LOW (ref 3.87–5.11)
RDW: 15.3 % (ref 11.5–15.5)
WBC Count: 9.8 10*3/uL (ref 4.0–10.5)
nRBC: 0 % (ref 0.0–0.2)

## 2024-02-05 LAB — CMP (CANCER CENTER ONLY)
ALT: 9 U/L (ref 0–44)
AST: 11 U/L — ABNORMAL LOW (ref 15–41)
Albumin: 4.1 g/dL (ref 3.5–5.0)
Alkaline Phosphatase: 78 U/L (ref 38–126)
Anion gap: 9 (ref 5–15)
BUN: 18 mg/dL (ref 8–23)
CO2: 25 mmol/L (ref 22–32)
Calcium: 9.4 mg/dL (ref 8.9–10.3)
Chloride: 104 mmol/L (ref 98–111)
Creatinine: 0.89 mg/dL (ref 0.44–1.00)
GFR, Estimated: >60 mL/min (ref >60)
Glucose, Bld: 174 mg/dL — ABNORMAL HIGH (ref 70–99)
Potassium: 4.5 mmol/L (ref 3.5–5.1)
Sodium: 138 mmol/L (ref 135–145)
Total Bilirubin: 0.5 mg/dL (ref 0.0–1.2)
Total Protein: 6.6 g/dL (ref 6.5–8.1)

## 2024-02-05 MED ORDER — TEMAZEPAM 15 MG PO CAPS: 15.0000 mg | ORAL_CAPSULE | Freq: Every evening | ORAL | 0 refills | Status: DC | PRN

## 2024-02-05 MED ORDER — SODIUM CHLORIDE 0.9 % IV SOLN: Freq: Once | INTRAVENOUS | Status: AC

## 2024-02-05 MED ORDER — SODIUM CHLORIDE 0.9 % IV SOLN
500.0000 mg/m2 | Freq: Once | INTRAVENOUS | Status: AC
  Administered 2024-02-05: 900 mg via INTRAVENOUS
  Filled 2024-02-05: qty 20

## 2024-02-05 MED ORDER — DEXAMETHASONE SODIUM PHOSPHATE 10 MG/ML IJ SOLN
10.0000 mg | Freq: Once | INTRAMUSCULAR | Status: AC
  Administered 2024-02-05: 10 mg via INTRAVENOUS
  Filled 2024-02-05: qty 1

## 2024-02-05 NOTE — Patient Instructions (Signed)
 [facility] - A DEPT OF [facility]  Discharge Instructions: Thank you for choosing [facility] to provide your oncology and hematology care.   If you have a lab appointment with the Cancer Center, please go directly to the Cancer Center and check in at the registration area.   Wear comfortable clothing and clothing appropriate for easy access to any Portacath or PICC line.   We strive to give you quality time with your provider. You may need to reschedule your appointment if you arrive late (15 or more minutes).  Arriving late affects you and other patients whose appointments are after yours.  Also, if you miss three or more appointments without notifying the office, you may be dismissed from the clinic at the provider's discretion.      For prescription refill requests, have your pharmacy contact our office and allow 72 hours for refills to be completed.    Today you received the following chemotherapy and/or immunotherapy agents Alimta      To help prevent nausea and vomiting after your treatment, we encourage you to take your nausea medication as directed.  BELOW ARE SYMPTOMS THAT SHOULD BE REPORTED IMMEDIATELY: *FEVER GREATER THAN 100.4 F (38 C) OR HIGHER *CHILLS OR SWEATING *NAUSEA AND VOMITING THAT IS NOT CONTROLLED WITH YOUR NAUSEA MEDICATION *UNUSUAL SHORTNESS OF BREATH *UNUSUAL BRUISING OR BLEEDING *URINARY PROBLEMS (pain or burning when urinating, or frequent urination) *BOWEL PROBLEMS (unusual diarrhea, constipation, pain near the anus) TENDERNESS IN MOUTH AND THROAT WITH OR WITHOUT PRESENCE OF ULCERS (sore throat, sores in mouth, or a toothache) UNUSUAL RASH, SWELLING OR PAIN  UNUSUAL VAGINAL DISCHARGE OR ITCHING   Items with * indicate a potential emergency and should be followed up as soon as possible or go to the Emergency Department if any problems should occur.  Please show the CHEMOTHERAPY ALERT CARD or IMMUNOTHERAPY  ALERT CARD at check-in to the Emergency Department and triage nurse.  Should you have questions after your visit or need to cancel or reschedule your appointment, please contact [facility] - A DEPT OF [facility]  Dept: [phone]  and follow the prompts.  Office hours are 8:00 a.m. to 4:30 p.m. Monday - Friday. Please note that voicemails left after 4:00 p.m. may not be returned until the following business day.  We are closed weekends and major holidays. You have access to a nurse at all times for urgent questions. Please call the main number to the clinic Dept: [phone] and follow the prompts.   For any non-urgent questions, you may also contact your provider using MyChart. We now offer e-Visits for anyone 79 and older to request care online for non-urgent symptoms. For details visit [email or web address].   Also download the MyChart app! Go to the app store, search "MyChart", open the app, select [facility], and log in with your MyChart username and password.

## 2024-02-13 ENCOUNTER — Other Ambulatory Visit: Payer: Self-pay

## 2024-02-14 DIAGNOSIS — N3020 Other chronic cystitis without hematuria: Secondary | ICD-10-CM | POA: Diagnosis not present

## 2024-02-14 DIAGNOSIS — N952 Postmenopausal atrophic vaginitis: Secondary | ICD-10-CM | POA: Diagnosis not present

## 2024-02-14 DIAGNOSIS — R3121 Asymptomatic microscopic hematuria: Secondary | ICD-10-CM | POA: Diagnosis not present

## 2024-02-20 ENCOUNTER — Ambulatory Visit: Admitting: Family Medicine

## 2024-02-23 ENCOUNTER — Telehealth: Payer: Self-pay | Admitting: Physician Assistant

## 2024-02-23 NOTE — Telephone Encounter (Signed)
 Called the patient to reschedule appointments per provider on PAL. Confirmed appointment changes.

## 2024-02-26 ENCOUNTER — Inpatient Hospital Stay

## 2024-02-26 ENCOUNTER — Inpatient Hospital Stay (HOSPITAL_BASED_OUTPATIENT_CLINIC_OR_DEPARTMENT_OTHER): Admitting: Internal Medicine

## 2024-02-26 VITALS — HR 89

## 2024-02-26 DIAGNOSIS — C3411 Malignant neoplasm of upper lobe, right bronchus or lung: Secondary | ICD-10-CM

## 2024-02-26 DIAGNOSIS — Z923 Personal history of irradiation: Secondary | ICD-10-CM | POA: Diagnosis not present

## 2024-02-26 DIAGNOSIS — K5900 Constipation, unspecified: Secondary | ICD-10-CM | POA: Diagnosis not present

## 2024-02-26 DIAGNOSIS — Z79899 Other long term (current) drug therapy: Secondary | ICD-10-CM | POA: Diagnosis not present

## 2024-02-26 DIAGNOSIS — C7931 Secondary malignant neoplasm of brain: Secondary | ICD-10-CM | POA: Diagnosis not present

## 2024-02-26 DIAGNOSIS — Z5112 Encounter for antineoplastic immunotherapy: Secondary | ICD-10-CM | POA: Diagnosis not present

## 2024-02-26 LAB — CMP (CANCER CENTER ONLY)
ALT: 10 U/L (ref 0–44)
AST: 10 U/L — ABNORMAL LOW (ref 15–41)
Albumin: 4 g/dL (ref 3.5–5.0)
Alkaline Phosphatase: 86 U/L (ref 38–126)
Anion gap: 8 (ref 5–15)
BUN: 31 mg/dL — ABNORMAL HIGH (ref 8–23)
CO2: 25 mmol/L (ref 22–32)
Calcium: 9.5 mg/dL (ref 8.9–10.3)
Chloride: 103 mmol/L (ref 98–111)
Creatinine: 1.14 mg/dL — ABNORMAL HIGH (ref 0.44–1.00)
GFR, Estimated: 51 mL/min — ABNORMAL LOW (ref >60)
Glucose, Bld: 162 mg/dL — ABNORMAL HIGH (ref 70–99)
Potassium: 5.2 mmol/L — ABNORMAL HIGH (ref 3.5–5.1)
Sodium: 136 mmol/L (ref 135–145)
Total Bilirubin: 0.3 mg/dL (ref 0.0–1.2)
Total Protein: 6.8 g/dL (ref 6.5–8.1)

## 2024-02-26 LAB — CBC WITH DIFFERENTIAL (CANCER CENTER ONLY)
Abs Immature Granulocytes: 0.09 10*3/uL — ABNORMAL HIGH (ref 0.00–0.07)
Basophils Absolute: 0 10*3/uL (ref 0.0–0.1)
Basophils Relative: 0 %
Eosinophils Absolute: 0 10*3/uL (ref 0.0–0.5)
Eosinophils Relative: 0 %
HCT: 34.8 % — ABNORMAL LOW (ref 36.0–46.0)
Hemoglobin: 11.1 g/dL — ABNORMAL LOW (ref 12.0–15.0)
Immature Granulocytes: 1 %
Lymphocytes Relative: 4 %
Lymphs Abs: 0.5 10*3/uL — ABNORMAL LOW (ref 0.7–4.0)
MCH: 31.7 pg (ref 26.0–34.0)
MCHC: 31.9 g/dL (ref 30.0–36.0)
MCV: 99.4 fL (ref 80.0–100.0)
Monocytes Absolute: 0.5 10*3/uL (ref 0.1–1.0)
Monocytes Relative: 4 %
Neutro Abs: 12.8 10*3/uL — ABNORMAL HIGH (ref 1.7–7.7)
Neutrophils Relative %: 91 %
Platelet Count: 345 10*3/uL (ref 150–400)
RBC: 3.5 MIL/uL — ABNORMAL LOW (ref 3.87–5.11)
RDW: 15.6 % — ABNORMAL HIGH (ref 11.5–15.5)
WBC Count: 13.9 10*3/uL — ABNORMAL HIGH (ref 4.0–10.5)
nRBC: 0 % (ref 0.0–0.2)

## 2024-02-26 MED ORDER — CYANOCOBALAMIN 1000 MCG/ML IJ SOLN
1000.0000 ug | Freq: Once | INTRAMUSCULAR | Status: AC
  Administered 2024-02-26: 1000 ug via INTRAMUSCULAR
  Filled 2024-02-26: qty 1

## 2024-02-26 MED ORDER — DEXAMETHASONE SODIUM PHOSPHATE 10 MG/ML IJ SOLN
10.0000 mg | Freq: Once | INTRAMUSCULAR | Status: AC
  Administered 2024-02-26: 10 mg via INTRAVENOUS
  Filled 2024-02-26: qty 1

## 2024-02-26 MED ORDER — ONDANSETRON HCL 8 MG PO TABS
ORAL_TABLET | ORAL | 1 refills | Status: AC
Start: 2024-02-26 — End: ?

## 2024-02-26 MED ORDER — SODIUM CHLORIDE 0.9 % IV SOLN: Freq: Once | INTRAVENOUS | Status: AC

## 2024-02-26 MED ORDER — HEPARIN SOD (PORK) LOCK FLUSH 100 UNIT/ML IV SOLN
500.0000 [IU] | Freq: Once | INTRAVENOUS | Status: DC | PRN
Start: 2024-02-26 — End: 2024-02-26

## 2024-02-26 MED ORDER — SODIUM CHLORIDE 0.9% FLUSH: 10.0000 mL | INTRAVENOUS | Status: DC | PRN

## 2024-02-26 MED ORDER — SODIUM CHLORIDE 0.9 % IV SOLN
500.0000 mg/m2 | Freq: Once | INTRAVENOUS | Status: AC
  Administered 2024-02-26: 900 mg via INTRAVENOUS
  Filled 2024-02-26: qty 20

## 2024-02-26 NOTE — Progress Notes (Signed)
 [facility] Telephone:([phone]) [phone]   [phone]) [phone]  OFFICE PROGRESS NOTE  [name], MD [address] [place] [address]  DIAGNOSIS: Stage IV (T2a, N0, M1b) non-small cell lung cancer, adenocarcinoma with negative EGFR and ALK mutations diagnosed in January 2015 and presented with right upper lobe lung mass in addition to a solitary brain metastasis.  PRIOR THERAPY: 1) Status post stereotactic radiotherapy to a solitary right parietal brain lesion under the care of Dr. Dewey on 10/16/2013. 2) Status post palliative radiotherapy to the right lung tumor under the care of Dr. Dewey completed on 12/05/2013. 3) Systemic chemotherapy with carboplatin  for AUC of 5 and Alimta  500 mg/M2 every 3 weeks. First dose Jan 06 2014. Status post 6 cycles.  CURRENT THERAPY: Systemic chemotherapy with maintenance Alimta  500 MG/M2 every 3 weeks, status post 167 cycles.  INTERVAL HISTORY: Alexis [name] 73 y.o. female returns to the clinic today for follow-up visit accompanied by her husband Nadara. Discussed the use of AI scribe software for clinical note transcription with the patient, who gave verbal consent to proceed.  History of Present Illness   Alexis [name] is a 73 year old female with stage four non-small cell lung cancer who presents for evaluation before starting cycle 168 of maintenance Alimta . She is accompanied by her husband.  She has been undergoing treatment for stage four non-small cell lung cancer, adenocarcinoma, since January 2015. Initially, she received systemic chemotherapy with carboplatin  and Alimta  every three weeks for six cycles, followed by maintenance treatment with single agent Alimta  every three weeks. She is currently preparing to start cycle 168 of maintenance Alimta .  She is experiencing constipation, which she attributes to a recent course of antibiotics prescribed by her urologist for a urinary tract infection. She had hematuria and was  unable to undergo a planned cystoscopy due to the infection. The antibiotics were taken for seven days, and the dysuria resolved by the next morning after starting the medication, which she believes was Bactrim  DS.  No nausea is reported, and she has Zofran  available for nausea management, although she has not needed it recently.        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  MEDICATIONS:  Current Outpatient Medications  Medication Sig Dispense Refill   acetaminophen  (TYLENOL ) 500 MG tablet Take 500 mg by mouth every 6 (six) hours as needed for mild pain or headache. Reported on 11/02/2015     Ascorbic Acid (VITAMIN C GUMMIE PO) Take 1 each by mouth every morning.     aspirin  EC 81 MG tablet Take 1 tablet (81 mg total) by mouth daily. 150 tablet 2   Cranberry 240 MG CAPS Take 1 capsule by mouth daily. This is in her probiotic     [employer] ACID CONTROLLER 10 MG tablet SMARTSIG:1 Tablet(s) By Mouth Every 12 Hours PRN     dexamethasone  (DECADRON ) 4 MG tablet TAKE 1 TABLET TWICE A DAY THE DAY BEFORE, THE DAY OF, AND THE DAY AFTER  CHEMOTHERAPY. 40 tablet 2   estradiol (ESTRACE) 0.1 MG/GM vaginal cream Place 1 Applicatorful vaginally daily.     folic acid  (FOLVITE ) 1 MG tablet TAKE 1 TABLET BY MOUTH EVERY DAY 90 tablet 0   glimepiride  (AMARYL ) 2 MG tablet TAKE 1 TABLET BY MOUTH DAILY BEFORE BREAKFAST. 90 tablet 0   loratadine (CLARITIN) 10 MG tablet Take 10 mg by mouth daily.     Multiple Vitamin (MULTIVITAMIN WITH MINERALS) TABS tablet Take 1 tablet by mouth every morning.      omeprazole  (PRILOSEC) 20 MG capsule TAKE 1 CAPSULE BY MOUTH EVERY DAY 90 capsule 0   ondansetron  (ZOFRAN ) 8 MG tablet TAKE 1 TABLET BY MOUTH BEFORE CHEMO 30 tablet 1   phenazopyridine  (PYRIDIUM ) 200 MG tablet Take 1 tablet (200 mg total) by mouth every 8 (eight) hours as needed. 10 tablet 0   prochlorperazine  (COMPAZINE ) 10 MG tablet Take 1 tablet (10 mg total) by mouth every 6 (six) hours as needed for nausea or vomiting. 60 tablet 0   rosuvastatin  (CRESTOR ) 10 MG tablet Take 1 tablet (10 mg total) by mouth daily. 30 tablet 12   senna-docusate (SENOKOT-S) 8.6-50 MG tablet Take 1 tablet by mouth daily. 30 tablet prn   temazepam  (RESTORIL ) 15 MG capsule Take 1 capsule (15 mg total) by mouth at bedtime as needed for sleep. 30 capsule 0   trimethoprim  (TRIMPEX ) 100 MG tablet Take 100 mg by mouth daily.     Vitamin D, Ergocalciferol, (DRISDOL) 1.25 MG (50000 UNIT) CAPS capsule Take 50,000 Units by mouth once a week. Vit d 3     No current facility-administered medications for this visit.    SURGICAL HISTORY:  Past Surgical History:  Procedure Laterality Date   ABDOMINAL HYSTERECTOMY     COLOSTOMY TAKEDOWN N/A 07/10/2014   Procedure: LAPAROSCOPIC LYSIS OF ADHESIONS (90 MIN) LAPAROSCOPIC ASSISTED COLOSTOMY CLOSURE, RIGID PROCTOSIGMOIDOSCOPY;  Surgeon: Krystal Russell, MD;  Location: [facility];  Service: General;  Laterality: N/A;   LAPAROTOMY N/A 11/03/2013   Procedure: EXPLORATORY LAPAROTOMY, DRAINAGE OF INTRA  ABDOMINAL ABSCESSES, MOBILIZATION OF SPLENIC FLEXURE, SIGMOID COLECTOMY WITH COLOSTOMY;  Surgeon: Krystal JINNY Russell, MD;  Location: [facility];  Service: General;  Laterality: N/A;   VIDEO BRONCHOSCOPY Bilateral 08/30/2013   Procedure: VIDEO BRONCHOSCOPY WITH FLUORO;  Surgeon: Ozell KATHEE America, MD;  Location: [facility];  Service: Cardiopulmonary;  Laterality: Bilateral;    REVIEW OF SYSTEMS:  A comprehensive review of systems was negative except for: Constitutional: positive for fatigue   PHYSICAL  EXAMINATION: General appearance: alert, cooperative, and no distress Head: Normocephalic, without obvious abnormality, atraumatic Neck: no adenopathy, no JVD, supple, symmetrical, trachea midline, and thyroid  not enlarged, symmetric, no tenderness/mass/nodules Lymph nodes: Cervical, supraclavicular, and axillary nodes normal. Resp: clear to auscultation bilaterally Back: symmetric, no curvature. ROM normal. No CVA tenderness. Cardio: regular rate and rhythm, S1, S2 normal, no murmur, click, rub or gallop GI: soft, non-tender; bowel sounds normal; no masses,  no organomegaly Extremities: extremities normal, atraumatic, no cyanosis or edema   ECOG PERFORMANCE STATUS: 1 - Symptomatic but completely ambulatory   Blood pressure 118/66, temperature 98.1 F (36.7 C), temperature source Temporal, resp. rate 17, height 5' 4.5 (1.638 m), weight 167 lb 4.8 oz (75.9 kg), SpO2 98%.  LABORATORY DATA: Lab Results  Component Value Date   WBC 9.8 02/05/2024   HGB 11.1 (L) 02/05/2024   HCT 33.8 (L) 02/05/2024   MCV 98.0 02/05/2024   PLT 306 02/05/2024      Chemistry  Component Value Date/Time   NA 138 02/05/2024 0949   NA 140 11/03/2022 0844   NA 139 08/14/2017 0837   K 4.5 02/05/2024 0949   K 4.4 08/14/2017 0837   CL 104 02/05/2024 0949   CO2 25 02/05/2024 0949   CO2 24 08/14/2017 0837   BUN 18 02/05/2024 0949   BUN 33 (H) 11/03/2022 0844   BUN 13.3 08/14/2017 0837   CREATININE 0.89 02/05/2024 0949   CREATININE 0.8 08/14/2017 0837      Component Value Date/Time   CALCIUM  9.4 02/05/2024 0949   CALCIUM  9.3 08/14/2017 0837   ALKPHOS 78 02/05/2024 0949   ALKPHOS 107 08/14/2017 0837   AST 11 (L) 02/05/2024 0949   AST 12 08/14/2017 0837   ALT 9 02/05/2024 0949   ALT 12 08/14/2017 0837   BILITOT 0.5 02/05/2024 0949   BILITOT 0.37 08/14/2017 0837       RADIOGRAPHIC STUDIES: No results found.    ASSESSMENT AND PLAN:  This is a very pleasant 73 years old white female with  metastatic non-small cell lung cancer, adenocarcinoma status post induction systemic chemotherapy with carboplatin  and Alimta  with partial response.  The patient has no actionable EGFR or ALK mutations at the time of her diagnosis. The patient is currently on maintenance treatment with single agent Alimta  status post 167 cycles.  She has been tolerating this treatment fairly well with no concerning adverse effects. Assessment and Plan    Stage 4 non-small cell lung cancer, adenocarcinoma Stage 4 non-small cell lung cancer, adenocarcinoma, diagnosed in January 2015. Currently on maintenance treatment with Alimta , having completed 167 cycles. She is here for evaluation before starting cycle 168. The treatment has been ongoing for a long duration, and there is consideration of giving a treatment break if the next scan shows favorable results. The scan shows some residual areas, but a biopsy is not deemed necessary at this time. The possibility of a treatment break will be revisited after the next scan. - Continue Alimta  maintenance treatment, starting cycle 168 - Evaluate next scan in approximately six weeks - Discuss potential treatment break if the next scan shows favorable results  Urinary tract infection Recent urinary tract infection treated with a 7-day course of antibiotics, likely Bactrim  DS, prescribed by her urologist. The infection caused hematuria, and a planned cystoscopy was postponed. She reports improvement in symptoms following antibiotic treatment. Constipation is noted, possibly related to antibiotic use. - Follow up with urologist in 4-6 weeks for reassessment and potential cystoscopy    For the anemia, the patient will continue on the oral iron tablets. For the insomnia, she is currently on Restoril  15 mg p.o. nightly She was advised to call immediately if she has any concerning symptoms in the interval The patient voices understanding of current disease status and treatment options  and is in agreement with the current care plan. All questions were answered. The patient knows to call the clinic with any problems, questions or concerns. We can certainly see the patient much sooner if necessary.  Disclaimer: This note was dictated with voice recognition software. Similar sounding words can inadvertently be transcribed and may not be corrected upon review.

## 2024-03-04 ENCOUNTER — Ambulatory Visit: Payer: Self-pay

## 2024-03-04 DIAGNOSIS — N390 Urinary tract infection, site not specified: Secondary | ICD-10-CM | POA: Diagnosis not present

## 2024-03-04 DIAGNOSIS — R300 Dysuria: Secondary | ICD-10-CM | POA: Diagnosis not present

## 2024-03-04 NOTE — Telephone Encounter (Signed)
 FYI Only or Action Required?: Action required by provider: Would like to drop a urine sample off today.  Patient was last seen in primary care on 11/16/2023 by [name], MD. Called Nurse Triage reporting No chief complaint on file.. Symptoms began several days ago. Interventions attempted: Nothing. Symptoms are: gradually worsening.  Triage Disposition: See HCP Within 4 Hours (Or PCP Triage)  Patient/caregiver understands and will follow disposition?: No, wishes to speak with PCP     Copied from CRM [id number]. Topic: Clinical - Red Word Triage >> Mar 04, 2024  8:15 AM Robinson H wrote: [facility] that prompted transfer to Nurse Triage: Burning while urinating, pain is radiating up back of left arm. Reason for Disposition  [1] SEVERE pain with urination (e.g., excruciating) AND [2] not improved after 2 hours of pain medicine and Sitz bath  Answer Assessment - Initial Assessment Questions 1. SEVERITY: How bad is the pain?  (e.g., Scale 1-10; mild, moderate, or severe)   - MILD (1-3): complains slightly about urination hurting   - MODERATE (4-7): interferes with normal activities     - SEVERE (8-10): excruciating, unwilling or unable to urinate because of the pain      severe 2. FREQUENCY: How many times have you had painful urination today?      Every time 3. PATTERN: Is pain present every time you urinate or just sometimes?      everytime 4. ONSET: When did the painful urination start?      Saturday 5. FEVER: Do you have a fever? If Yes, ask: What is your temperature, how was it measured, and when did it start?     no 6. PAST UTI: Have you had a urine infection before? If Yes, ask: When was the last time? and What happened that time?      yes 7. CAUSE: What do you think is causing the painful urination?  (e.g., UTI, scratch, Herpes sore)     UTI 8. OTHER SYMPTOMS: Do you have any other symptoms? (e.g., blood in urine, flank pain, genital sores, urgency,  vaginal discharge)     Denies.  9. PREGNANCY: Is there any chance you are pregnant? When was your last menstrual period?     na  Protocols used: Urination Pain - Female-A-AH

## 2024-03-04 NOTE — Telephone Encounter (Signed)
 Contacted patient to see about scheduling a nurse visit and she stated she had already been to the UC and didn't need us  anymore, was a little upset due to the fact that she didn't get in earlier.

## 2024-03-12 ENCOUNTER — Other Ambulatory Visit: Payer: Self-pay | Admitting: Internal Medicine

## 2024-03-12 DIAGNOSIS — C3411 Malignant neoplasm of upper lobe, right bronchus or lung: Secondary | ICD-10-CM

## 2024-03-12 NOTE — Progress Notes (Signed)
 [facility] OFFICE PROGRESS NOTE  [name], MD [address] [place] [address]  DIAGNOSIS: Stage IV (T2a, N0, M1b) non-small cell lung cancer, adenocarcinoma with negative EGFR and ALK mutations diagnosed in January 2015 and presented with right upper lobe lung mass in addition to a solitary brain metastasis   PRIOR THERAPY: 1) Status post stereotactic radiotherapy to a solitary right parietal brain lesion under the care of Dr. Dewey on 10/16/2013. 2) Status post palliative radiotherapy to the right lung tumor under the care of Dr. Dewey completed on 12/05/2013. 3) Systemic chemotherapy with carboplatin  for AUC of 5 and Alimta  500 mg/M2 every 3 weeks. First dose Jan 06 2014. Status post 6 cycles  CURRENT THERAPY: Systemic chemotherapy with maintenance Alimta  500 MG/M2 every 3 weeks, status post 168 cycles    INTERVAL HISTORY: Alexis [name] 73 y.o. female returns to the clinic today for a follow-up visit. She was last seen by Dr. Sherrod 3 weeks ago. She is undergoing chemotherapy with Alimta  and tolerates it well. She has been on alimta  several years. She is doing well today. She takes restoril  for insomnia.   She has been experiencing recurrent urinary tract infections (UTIs). A cystoscopy was planned but postponed due to an active infection. She completed a 7-day course of antibiotics, taking two pills per day, but continued to experience severe burning and frequent urination. She visited urgent care and was prescribed Cipro  and pain medication that discolored her urine. Subsequent stronger antibiotics caused headaches and stomach pain but eventually alleviated her symptoms. A urine culture showed no bacteria, and she provided a urine sample for further evaluation.  Her hoping to reschedule her cystoscopy in 6 weeks or so.  She experiences significant sleep disturbances, sleeping only two hours over three days. She takes Restoril  but finds it ineffective, waking up  around 3:00 AM despite taking it at 10:00 PM. She requests a stronger dose to improve her sleep quality. No fever or chills.   She experiences constipation, managed with Metamucil and Miralax, which provides relief after two to three days.  Her blood sugar was 336 mg/dL, and she reported that her usual range is 160-167 mg/dL when on steroids. Her recent A1c was 6.8% reportedly drawn by her PCP last week.   Her PCP is arranging for DEXA, mammogram, and colonoscopy.   She denies any current chest pain, significant cough, or hemoptysis. She has dyspnea on exertion which is stable. She denies any nausea or vomiting.  Denies any diarrhea. Denies any visual changes at this time. She follows closely with Dr. Dewey regarding her history of metastatic disease to the brain. She is here for evaluation and repeat blood work before undergoing cycle #169. Dr. Sherrod had told her if her next scan is stable he would consider her for a treatment break.    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  MEDICATIONS:  Current Outpatient Medications  Medication Sig Dispense Refill   acetaminophen  (TYLENOL ) 500 MG tablet Take 500 mg by mouth  every 6 (six) hours as needed for mild pain or headache. Reported on 11/02/2015     Ascorbic Acid (VITAMIN C GUMMIE PO) Take 1 each by mouth every morning.     aspirin  EC 81 MG tablet Take 1 tablet (81 mg total) by mouth daily. 150 tablet 2   Cranberry 240 MG CAPS Take 1 capsule by mouth daily. This is in her probiotic     [employer] ACID CONTROLLER 10 MG tablet SMARTSIG:1  Tablet(s) By Mouth Every 12 Hours PRN     dexamethasone  (DECADRON ) 4 MG tablet TAKE 1 TABLET TWICE A DAY THE DAY BEFORE, THE DAY OF, AND THE DAY AFTER CHEMOTHERAPY. 40 tablet 2   estradiol (ESTRACE) 0.1 MG/GM vaginal cream Place 1 Applicatorful vaginally daily.     folic acid  (FOLVITE ) 1 MG tablet TAKE 1 TABLET BY MOUTH EVERY DAY 90 tablet 0   glimepiride  (AMARYL ) 2 MG tablet TAKE 1 TABLET BY MOUTH DAILY BEFORE BREAKFAST. 90 tablet 0   loratadine (CLARITIN) 10 MG tablet Take 10 mg by mouth daily.     Multiple Vitamin (MULTIVITAMIN WITH MINERALS) TABS tablet Take 1 tablet by mouth every morning.     omeprazole  (PRILOSEC) 20 MG capsule TAKE 1 CAPSULE BY MOUTH EVERY DAY 90 capsule 0   ondansetron  (ZOFRAN ) 8 MG tablet TAKE 1 TABLET BY MOUTH BEFORE CHEMO 30 tablet 1   phenazopyridine  (PYRIDIUM ) 200 MG tablet Take 1 tablet (200 mg total) by mouth every 8 (eight) hours as needed. 10 tablet 0   prochlorperazine  (COMPAZINE ) 10 MG tablet Take 1 tablet (10 mg total) by mouth every 6 (six) hours as needed for nausea or vomiting. 60 tablet 0   rosuvastatin  (CRESTOR ) 10 MG tablet Take 1 tablet (10 mg total) by mouth daily. 30 tablet 12   senna-docusate (SENOKOT-S) 8.6-50 MG tablet Take 1 tablet by mouth daily. 30 tablet prn   temazepam  (RESTORIL ) 15 MG capsule Take 1 capsule (15 mg total) by mouth at bedtime as needed for sleep. 30 capsule 0   trimethoprim  (TRIMPEX ) 100 MG tablet Take 100 mg by mouth daily.     Vitamin D, Ergocalciferol, (DRISDOL) 1.25 MG (50000 UNIT) CAPS capsule Take 50,000 Units by mouth once a week. Vit d 3     No current facility-administered medications for this visit.    SURGICAL HISTORY:  Past Surgical History:  Procedure Laterality Date   ABDOMINAL HYSTERECTOMY     COLOSTOMY TAKEDOWN N/A 07/10/2014   Procedure: LAPAROSCOPIC LYSIS OF ADHESIONS (90 MIN) LAPAROSCOPIC ASSISTED COLOSTOMY CLOSURE, RIGID PROCTOSIGMOIDOSCOPY;  Surgeon: Krystal Russell, MD;  Location: [facility];  Service:  General;  Laterality: N/A;   LAPAROTOMY N/A 11/03/2013   Procedure: EXPLORATORY LAPAROTOMY, DRAINAGE OF INTRA  ABDOMINAL ABSCESSES, MOBILIZATION OF SPLENIC FLEXURE, SIGMOID COLECTOMY WITH COLOSTOMY;  Surgeon: Krystal JINNY Russell, MD;  Location: [facility];  Service: General;  Laterality: N/A;   VIDEO BRONCHOSCOPY Bilateral 08/30/2013   Procedure: VIDEO BRONCHOSCOPY WITH FLUORO;  Surgeon: Ozell KATHEE America, MD;  Location: [facility];  Service: Cardiopulmonary;  Laterality: Bilateral;    REVIEW OF SYSTEMS:   Constitutional: Negative for appetite change, chills, fatigue, fever and unexpected weight change.  HENT: Negative for mouth sores, nosebleeds, sore throat and trouble swallowing.   Eyes: Negative for eye problems and icterus.  Respiratory: Positive for dyspnea on exertion. Negative for cough, hemoptysis, and wheezing.   Cardiovascular: Negative for chest pain and leg swelling.  Gastrointestinal: Improved constipation. Negative for abdominal pain, diarrhea, nausea and vomiting.  Genitourinary: Improved dysuria.  Negative for bladder incontinence, difficulty urinating, dysuria, frequency and hematuria.   Musculoskeletal: Negative for gait problem, neck pain and neck stiffness.  Skin: Negative for itching and rash.  Neurological: Negative for dizziness, extremity weakness, gait problem, headaches, light-headedness and seizures.  Hematological: Negative for adenopathy. Does not bruise/bleed easily.  Psychiatric/Behavioral: Improved insomnia. Negative for confusion, depression and sleep disturbance. The patient is not nervous/anxious.    PHYSICAL EXAMINATION:  There were no vitals taken for this visit.  ECOG PERFORMANCE STATUS: 1  Physical Exam  Constitutional: Oriented to person, place, and time and well-developed, well-nourished, and in no distress.  HENT:  Head: Normocephalic and atraumatic.  Mouth/Throat: Oropharynx is clear and moist. No oropharyngeal exudate.  Eyes: Conjunctivae are normal.  Right eye exhibits no discharge. Left eye exhibits no discharge. No scleral icterus.  Neck: Normal range of motion. Neck supple.  Cardiovascular: Normal rate, regular rhythm, normal heart sounds and intact distal pulses.   Pulmonary/Chest: Effort normal and breath sounds normal. No respiratory distress. No wheezes. No rales.  Abdominal: Soft. Bowel sounds are normal. Exhibits no distension and no mass. There is no tenderness.  Musculoskeletal: Normal range of motion. Exhibits no edema. Some tenderness to palpation over the right paravertebral musculature.  Lymphadenopathy:    No cervical adenopathy.  Neurological: Alert and oriented to person, place, and time. Exhibits normal muscle tone. Gait normal. Coordination normal.  Skin: Skin is warm and dry. No rash noted. Not diaphoretic. No erythema. No pallor. Mild bruising on upper extremities.  Psychiatric: Mood, memory and judgment normal.  Vitals reviewed. LABORATORY DATA: Lab Results  Component Value Date   WBC 13.9 (H) 02/26/2024   HGB 11.1 (L) 02/26/2024   HCT 34.8 (L) 02/26/2024   MCV 99.4 02/26/2024   PLT 345 02/26/2024      Chemistry      Component Value Date/Time   NA 136 02/26/2024 1320   NA 140 11/03/2022 0844   NA 139 08/14/2017 0837   K 5.2 (H) 02/26/2024 1320   K 4.4 08/14/2017 0837   CL 103 02/26/2024 1320   CO2 25 02/26/2024 1320   CO2 24 08/14/2017 0837   BUN 31 (H) 02/26/2024 1320   BUN 33 (H) 11/03/2022 0844   BUN 13.3 08/14/2017 0837   CREATININE 1.14 (H) 02/26/2024 1320   CREATININE 0.8 08/14/2017 0837      Component Value Date/Time   CALCIUM  9.5 02/26/2024 1320   CALCIUM  9.3 08/14/2017 0837   ALKPHOS 86 02/26/2024 1320   ALKPHOS 107 08/14/2017 0837   AST 10 (L) 02/26/2024 1320   AST 12 08/14/2017 0837   ALT 10 02/26/2024 1320   ALT 12 08/14/2017 0837   BILITOT 0.3 02/26/2024 1320   BILITOT 0.37 08/14/2017 0837       RADIOGRAPHIC STUDIES:  No results found.   ASSESSMENT/PLAN:  This is a  very pleasant 73 year old Caucasian female with stage IV non-small cell lung cancer, adenocarcinoma who presented with a right upper lobe lung mass in addition to a solitary brain metastatsis.  She was diagnosed in January 2015.    The patient had completed induction systemic chemotherapy with carboplatin  and Alimta  with a partial response.   The patient is currently being treated with single agent maintenance Alimta .  She is status post 168 cycles.     Labs were reviewed. Recommend that she proceed with #169 today as scheduled.    We will see her back for follow-up visit in 3 weeks for evaluation repeat blood work before  undergoing cycle #170   She will continue to take restoril  for her insomnia. I have slightly increased the dose to 22.5 mg.   Diabetes mellitus Blood glucose elevated at 336 mg/dL. She did just eat but this is higher than normal for her when she comes to the clinic. She takes decadron  prior to every appointment. A1c at 6.8. Discussed monitoring blood glucose and potential medication adjustment. - Contact family doctor to discuss elevated blood glucose and A1c. I will route them a copy.  - Monitor blood glucose levels at home using glucometer and strips.  Recurrent urinary tract infections Recent episode treated with antibiotics. Urine culture negative. Symptoms improved.  The patient was advised to call immediately if she has any concerning symptoms in the interval. The patient voices understanding of current disease status and treatment options and is in agreement with the current care plan. All questions were answered. The patient knows to call the clinic with any problems, questions or concerns. We can certainly see the patient much sooner if necessary     No orders of the defined types were placed in this encounter.    The total time spent in the appointment was 20-29 minutes  Davieon Stockham L Ravynn Hogate, PA-C 03/12/24

## 2024-03-14 ENCOUNTER — Ambulatory Visit (INDEPENDENT_AMBULATORY_CARE_PROVIDER_SITE_OTHER): Admitting: Family Medicine

## 2024-03-14 ENCOUNTER — Encounter: Payer: Self-pay | Admitting: Family Medicine

## 2024-03-14 VITALS — BP 114/69 | HR 100 | Ht 64.5 in | Wt 168.4 lb

## 2024-03-14 DIAGNOSIS — N3001 Acute cystitis with hematuria: Secondary | ICD-10-CM

## 2024-03-14 DIAGNOSIS — L918 Other hypertrophic disorders of the skin: Secondary | ICD-10-CM

## 2024-03-14 DIAGNOSIS — N390 Urinary tract infection, site not specified: Secondary | ICD-10-CM | POA: Diagnosis not present

## 2024-03-14 DIAGNOSIS — E1169 Type 2 diabetes mellitus with other specified complication: Secondary | ICD-10-CM | POA: Diagnosis not present

## 2024-03-14 DIAGNOSIS — K5900 Constipation, unspecified: Secondary | ICD-10-CM

## 2024-03-14 DIAGNOSIS — E119 Type 2 diabetes mellitus without complications: Secondary | ICD-10-CM | POA: Diagnosis not present

## 2024-03-14 DIAGNOSIS — J449 Chronic obstructive pulmonary disease, unspecified: Secondary | ICD-10-CM

## 2024-03-14 DIAGNOSIS — E785 Hyperlipidemia, unspecified: Secondary | ICD-10-CM

## 2024-03-14 LAB — POCT GLYCOSYLATED HEMOGLOBIN (HGB A1C): HbA1c POC (<> result, manual entry): 6.7 % (ref 4.0–5.6)

## 2024-03-14 NOTE — Assessment & Plan Note (Signed)
-   Recent history of UTI, initially treated with an unspecified antibiotic. Symptoms of severe burning recurred post-treatment. A subsequent urgent care visit identified E. coli in the urine, and symptoms resolved with a second, stronger antibiotic course which was completed yesterday. - Obtain urine sample for repeat urinalysis and culture to confirm clearance of infection. - Obtain urine for microalbumin/creatinine ratio for diabetes monitoring.

## 2024-03-14 NOTE — Patient Instructions (Signed)
 It was nice to see you today,  We addressed the following topics today: - Your A1c was 6.7.  This is good. - We will get a urine sample to recheck your urine culture and do the urine protein test. - I will see back in 3 months - If you want to bring in a urine sample and get a future, just come by our office when we are open and let them know that I told you to come by.  Have a great day,  Rolan Slain, MD

## 2024-03-14 NOTE — Assessment & Plan Note (Signed)
-   Reports constipation and has recently started Miralax as needed. - Counselled to take Miralax one capful in 4-6 oz of water daily. Can increase to two doses per day if no bowel movement for two days. Advised against using magnesium  citrate to avoid electrolyte imbalance.

## 2024-03-14 NOTE — Progress Notes (Signed)
 Established Patient Office Visit  Subjective   Patient ID: Alexis [name], female    DOB: 1951-02-13  Age: 73 y.o. MRN: [id number]  Chief Complaint  Patient presents with   Medical Management of Chronic Issues    HPI  Subjective - Reports seeing urgent care for a bladder infection. A urine sample showed blood. Was prescribed an antibiotic. - Two and a half days after finishing the antibiotic, developed severe burning on urination. The pain radiated to the back of both arms. -  Was given a new antibiotic, which resolved the burning. Finished the last dose last night. - Reports a history of constipation and has recently started Miralax. - Reports a history of diverticulitis. - Reports having 170 treatments for cancer with Dr. Deatrice, who is now giving her a break from treatment and will order a CT scan before the next treatment. The last scans did not show a size for the tumor, which was previously 6 mm, down from 3.5 cm.    Medications Reports taking an antibiotic for a UTI, finished last night. Also takes Miralax for constipation. Mentions a pain medication that turns urine orange, which was stopped.  ROS GU: Reports recent burning with urination, which has resolved. Denies current burning. GI: Reports constipation. Denies diarrhea. SKIN: Reports a skin tag.   The 10-year ASCVD risk score (Arnett DK, et al., 2019) is: 17.7%  Health Maintenance Due  Topic Date Due   OPHTHALMOLOGY EXAM  Never done   Hepatitis C Screening  Never done   Pneumococcal Vaccine: 50+ Years (2 of 2 - PCV) 07/26/2020   FOOT EXAM  09/06/2022   COVID-19 Vaccine (5 - 2024-25 season) 04/30/2023   Medicare Annual Wellness (AWV)  11/10/2023      Objective:     BP 114/69   Pulse 100   Ht 5' 4.5 (1.638 m)   Wt 168 lb 6.4 oz (76.4 kg)   SpO2 99%   BMI 28.46 kg/m    Physical Exam Gen: alert, oriented Pulm: No resp distress Skin: small skin tag on the left abdominal wall.     Results for  orders placed or performed in visit on 03/14/24  Urine Culture   Specimen: Urine   Urine  Result Value Ref Range   Urine Culture, Routine Final report    Organism ID, Bacteria Comment   Urine Microalbumin w/creat. ratio  Result Value Ref Range   Creatinine, Urine 117.2 Not Estab. mg/dL   Microalbumin, Urine 7.9 Not Estab. ug/mL   Microalb/Creat Ratio 7 0 - 29 mg/g creat  POCT glycosylated hemoglobin (Hb A1C)  Result Value Ref Range   Hemoglobin A1C     HbA1c POC (<> result, manual entry) 6.7 4.0 - 5.6 %   HbA1c, POC (prediabetic range)     HbA1c, POC (controlled diabetic range)          Assessment & Plan:   Diabetes mellitus without complication (HCC) -     Microalbumin / creatinine urine ratio  Recurrent UTI Assessment & Plan: - Recent history of UTI, initially treated with an unspecified antibiotic. Symptoms of severe burning recurred post-treatment. A subsequent urgent care visit identified E. coli in the urine, and symptoms resolved with a second, stronger antibiotic course which was completed yesterday. - Obtain urine sample for repeat urinalysis and culture to confirm clearance of infection. - Obtain urine for microalbumin/creatinine ratio for diabetes monitoring.   Constipation, unspecified constipation type Assessment & Plan: - Reports constipation and has recently started  Miralax as needed. - Counselled to take Miralax one capful in 4-6 oz of water daily. Can increase to two doses per day if no bowel movement for two days. Advised against using magnesium  citrate to avoid electrolyte imbalance.   Acute cystitis with hematuria -     Urine Culture  Hyperlipidemia due to type 2 diabetes mellitus (HCC) -     POCT glycosylated hemoglobin (Hb A1C)  Skin tag Assessment & Plan: Left abdominal skin tag removed with dermablade.  No anesthesia.  Sterilized in the usual fashion with alcohol.  No sutures.  Tolerated procedure well.    COPD mixed type [facility]) Assessment &  Plan: No respiratory complaints.  Does not use inhalers.  No recent exacerbations.  Continue to monitor.      Return in about 3 months (around 06/14/2024) for DM.    Toribio MARLA Slain, MD

## 2024-03-15 LAB — MICROALBUMIN / CREATININE URINE RATIO
Creatinine, Urine: 117.2 mg/dL
Microalb/Creat Ratio: 7 mg/g{creat} (ref 0–29)
Microalbumin, Urine: 7.9 ug/mL

## 2024-03-16 ENCOUNTER — Other Ambulatory Visit: Payer: Self-pay | Admitting: Physician Assistant

## 2024-03-16 DIAGNOSIS — C3411 Malignant neoplasm of upper lobe, right bronchus or lung: Secondary | ICD-10-CM

## 2024-03-16 LAB — URINE CULTURE

## 2024-03-17 ENCOUNTER — Other Ambulatory Visit: Payer: Self-pay | Admitting: Physician Assistant

## 2024-03-17 ENCOUNTER — Encounter: Payer: Self-pay | Admitting: Internal Medicine

## 2024-03-17 DIAGNOSIS — L918 Other hypertrophic disorders of the skin: Secondary | ICD-10-CM | POA: Insufficient documentation

## 2024-03-17 DIAGNOSIS — C3490 Malignant neoplasm of unspecified part of unspecified bronchus or lung: Secondary | ICD-10-CM

## 2024-03-17 NOTE — Assessment & Plan Note (Signed)
 Left abdominal skin tag removed with dermablade.  No anesthesia.  Sterilized in the usual fashion with alcohol.  No sutures.  Tolerated procedure well.

## 2024-03-17 NOTE — Assessment & Plan Note (Signed)
 No respiratory complaints.  Does not use inhalers.  No recent exacerbations.  Continue to monitor.

## 2024-03-18 ENCOUNTER — Other Ambulatory Visit

## 2024-03-18 ENCOUNTER — Telehealth: Payer: Self-pay

## 2024-03-18 ENCOUNTER — Ambulatory Visit

## 2024-03-18 ENCOUNTER — Ambulatory Visit: Payer: Self-pay | Admitting: Family Medicine

## 2024-03-18 ENCOUNTER — Ambulatory Visit: Admitting: Physician Assistant

## 2024-03-18 NOTE — Telephone Encounter (Signed)
 Copied from CRM [id number]. Topic: Clinical - Lab/Test Results >> Mar 18, 2024  8:16 AM [name] wrote: Reason for CRM:  I gave Alexis [name] her Urine Culture results. She had no questions.

## 2024-03-19 ENCOUNTER — Inpatient Hospital Stay (HOSPITAL_BASED_OUTPATIENT_CLINIC_OR_DEPARTMENT_OTHER): Admitting: Physician Assistant

## 2024-03-19 ENCOUNTER — Inpatient Hospital Stay: Attending: Internal Medicine

## 2024-03-19 ENCOUNTER — Inpatient Hospital Stay

## 2024-03-19 VITALS — BP 128/61 | HR 98 | Temp 97.8°F | Resp 16 | Wt 168.9 lb

## 2024-03-19 DIAGNOSIS — Z7984 Long term (current) use of oral hypoglycemic drugs: Secondary | ICD-10-CM | POA: Diagnosis not present

## 2024-03-19 DIAGNOSIS — G4700 Insomnia, unspecified: Secondary | ICD-10-CM | POA: Insufficient documentation

## 2024-03-19 DIAGNOSIS — Z90710 Acquired absence of both cervix and uterus: Secondary | ICD-10-CM | POA: Diagnosis not present

## 2024-03-19 DIAGNOSIS — Z5111 Encounter for antineoplastic chemotherapy: Secondary | ICD-10-CM | POA: Diagnosis not present

## 2024-03-19 DIAGNOSIS — Z923 Personal history of irradiation: Secondary | ICD-10-CM | POA: Insufficient documentation

## 2024-03-19 DIAGNOSIS — Z79899 Other long term (current) drug therapy: Secondary | ICD-10-CM | POA: Insufficient documentation

## 2024-03-19 DIAGNOSIS — C3411 Malignant neoplasm of upper lobe, right bronchus or lung: Secondary | ICD-10-CM

## 2024-03-19 DIAGNOSIS — Z7982 Long term (current) use of aspirin: Secondary | ICD-10-CM | POA: Diagnosis not present

## 2024-03-19 DIAGNOSIS — Z5112 Encounter for antineoplastic immunotherapy: Secondary | ICD-10-CM | POA: Diagnosis present

## 2024-03-19 DIAGNOSIS — C7931 Secondary malignant neoplasm of brain: Secondary | ICD-10-CM | POA: Insufficient documentation

## 2024-03-19 DIAGNOSIS — Z8541 Personal history of malignant neoplasm of cervix uteri: Secondary | ICD-10-CM | POA: Diagnosis not present

## 2024-03-19 DIAGNOSIS — E1165 Type 2 diabetes mellitus with hyperglycemia: Secondary | ICD-10-CM | POA: Insufficient documentation

## 2024-03-19 LAB — CBC WITH DIFFERENTIAL (CANCER CENTER ONLY)
Abs Immature Granulocytes: 0.04 K/uL (ref 0.00–0.07)
Basophils Absolute: 0 K/uL (ref 0.0–0.1)
Basophils Relative: 0 %
Eosinophils Absolute: 0 K/uL (ref 0.0–0.5)
Eosinophils Relative: 0 %
HCT: 36.1 % (ref 36.0–46.0)
Hemoglobin: 11.4 g/dL — ABNORMAL LOW (ref 12.0–15.0)
Immature Granulocytes: 0 %
Lymphocytes Relative: 4 %
Lymphs Abs: 0.4 K/uL — ABNORMAL LOW (ref 0.7–4.0)
MCH: 31.4 pg (ref 26.0–34.0)
MCHC: 31.6 g/dL (ref 30.0–36.0)
MCV: 99.4 fL (ref 80.0–100.0)
Monocytes Absolute: 0.5 K/uL (ref 0.1–1.0)
Monocytes Relative: 4 %
Neutro Abs: 10.1 K/uL — ABNORMAL HIGH (ref 1.7–7.7)
Neutrophils Relative %: 92 %
Platelet Count: 350 K/uL (ref 150–400)
RBC: 3.63 MIL/uL — ABNORMAL LOW (ref 3.87–5.11)
RDW: 15.1 % (ref 11.5–15.5)
WBC Count: 11 K/uL — ABNORMAL HIGH (ref 4.0–10.5)
nRBC: 0 % (ref 0.0–0.2)

## 2024-03-19 LAB — CMP (CANCER CENTER ONLY)
ALT: 8 U/L (ref 0–44)
AST: 9 U/L — ABNORMAL LOW (ref 15–41)
Albumin: 3.6 g/dL (ref 3.5–5.0)
Alkaline Phosphatase: 86 U/L (ref 38–126)
Anion gap: 8 (ref 5–15)
BUN: 25 mg/dL — ABNORMAL HIGH (ref 8–23)
CO2: 24 mmol/L (ref 22–32)
Calcium: 9 mg/dL (ref 8.9–10.3)
Chloride: 105 mmol/L (ref 98–111)
Creatinine: 0.98 mg/dL (ref 0.44–1.00)
GFR, Estimated: >60 mL/min (ref >60)
Glucose, Bld: 336 mg/dL — ABNORMAL HIGH (ref 70–99)
Potassium: 4.2 mmol/L (ref 3.5–5.1)
Sodium: 137 mmol/L (ref 135–145)
Total Bilirubin: 0.4 mg/dL (ref 0.0–1.2)
Total Protein: 6.4 g/dL — ABNORMAL LOW (ref 6.5–8.1)

## 2024-03-19 MED ORDER — SODIUM CHLORIDE 0.9 % IV SOLN
500.0000 mg/m2 | Freq: Once | INTRAVENOUS | Status: AC
  Administered 2024-03-19: 900 mg via INTRAVENOUS
  Filled 2024-03-19: qty 20

## 2024-03-19 MED ORDER — DEXAMETHASONE SODIUM PHOSPHATE 10 MG/ML IJ SOLN
10.0000 mg | Freq: Once | INTRAMUSCULAR | Status: AC
  Administered 2024-03-19: 10 mg via INTRAVENOUS
  Filled 2024-03-19: qty 1

## 2024-03-19 MED ORDER — SODIUM CHLORIDE 0.9 % IV SOLN: Freq: Once | INTRAVENOUS | Status: AC

## 2024-03-19 MED ORDER — TEMAZEPAM 22.5 MG PO CAPS: 22.5000 mg | ORAL_CAPSULE | Freq: Every evening | ORAL | 0 refills | Status: DC | PRN

## 2024-03-19 NOTE — Patient Instructions (Signed)
 [facility] - A DEPT OF [facility]  Discharge Instructions: Thank you for choosing [facility] to provide your oncology and hematology care.   If you have a lab appointment with the Cancer Center, please go directly to the Cancer Center and check in at the registration area.   Wear comfortable clothing and clothing appropriate for easy access to any Portacath or PICC line.   We strive to give you quality time with your provider. You may need to reschedule your appointment if you arrive late (15 or more minutes).  Arriving late affects you and other patients whose appointments are after yours.  Also, if you miss three or more appointments without notifying the office, you may be dismissed from the clinic at the provider's discretion.      For prescription refill requests, have your pharmacy contact our office and allow 72 hours for refills to be completed.    Today you received the following chemotherapy and/or immunotherapy agents Alimta      To help prevent nausea and vomiting after your treatment, we encourage you to take your nausea medication as directed.  BELOW ARE SYMPTOMS THAT SHOULD BE REPORTED IMMEDIATELY: *FEVER GREATER THAN 100.4 F (38 C) OR HIGHER *CHILLS OR SWEATING *NAUSEA AND VOMITING THAT IS NOT CONTROLLED WITH YOUR NAUSEA MEDICATION *UNUSUAL SHORTNESS OF BREATH *UNUSUAL BRUISING OR BLEEDING *URINARY PROBLEMS (pain or burning when urinating, or frequent urination) *BOWEL PROBLEMS (unusual diarrhea, constipation, pain near the anus) TENDERNESS IN MOUTH AND THROAT WITH OR WITHOUT PRESENCE OF ULCERS (sore throat, sores in mouth, or a toothache) UNUSUAL RASH, SWELLING OR PAIN  UNUSUAL VAGINAL DISCHARGE OR ITCHING   Items with * indicate a potential emergency and should be followed up as soon as possible or go to the Emergency Department if any problems should occur.  Please show the CHEMOTHERAPY ALERT CARD or IMMUNOTHERAPY  ALERT CARD at check-in to the Emergency Department and triage nurse.  Should you have questions after your visit or need to cancel or reschedule your appointment, please contact [facility] - A DEPT OF [facility]  Dept: [phone]  and follow the prompts.  Office hours are 8:00 a.m. to 4:30 p.m. Monday - Friday. Please note that voicemails left after 4:00 p.m. may not be returned until the following business day.  We are closed weekends and major holidays. You have access to a nurse at all times for urgent questions. Please call the main number to the clinic Dept: [phone] and follow the prompts.   For any non-urgent questions, you may also contact your provider using MyChart. We now offer e-Visits for anyone 79 and older to request care online for non-urgent symptoms. For details visit [email or web address].   Also download the MyChart app! Go to the app store, search "MyChart", open the app, select [facility], and log in with your MyChart username and password.

## 2024-03-29 DIAGNOSIS — S80861A Insect bite (nonvenomous), right lower leg, initial encounter: Secondary | ICD-10-CM | POA: Diagnosis not present

## 2024-03-29 DIAGNOSIS — M79604 Pain in right leg: Secondary | ICD-10-CM | POA: Diagnosis not present

## 2024-03-29 DIAGNOSIS — L02413 Cutaneous abscess of right upper limb: Secondary | ICD-10-CM | POA: Diagnosis not present

## 2024-04-01 DIAGNOSIS — L2089 Other atopic dermatitis: Secondary | ICD-10-CM | POA: Diagnosis not present

## 2024-04-01 DIAGNOSIS — R21 Rash and other nonspecific skin eruption: Secondary | ICD-10-CM | POA: Diagnosis not present

## 2024-04-01 DIAGNOSIS — L259 Unspecified contact dermatitis, unspecified cause: Secondary | ICD-10-CM | POA: Diagnosis not present

## 2024-04-03 DIAGNOSIS — R3121 Asymptomatic microscopic hematuria: Secondary | ICD-10-CM | POA: Diagnosis not present

## 2024-04-03 DIAGNOSIS — N952 Postmenopausal atrophic vaginitis: Secondary | ICD-10-CM | POA: Diagnosis not present

## 2024-04-03 DIAGNOSIS — N3020 Other chronic cystitis without hematuria: Secondary | ICD-10-CM | POA: Diagnosis not present

## 2024-04-04 ENCOUNTER — Ambulatory Visit (HOSPITAL_COMMUNITY)
Admission: RE | Admit: 2024-04-04 | Discharge: 2024-04-04 | Disposition: A | Discharge status: Home / Self Care | Source: Ambulatory Visit | Attending: Physician Assistant | Admitting: Physician Assistant

## 2024-04-04 DIAGNOSIS — Z85118 Personal history of other malignant neoplasm of bronchus and lung: Secondary | ICD-10-CM | POA: Diagnosis not present

## 2024-04-04 DIAGNOSIS — C7931 Secondary malignant neoplasm of brain: Secondary | ICD-10-CM | POA: Diagnosis not present

## 2024-04-04 DIAGNOSIS — K432 Incisional hernia without obstruction or gangrene: Secondary | ICD-10-CM | POA: Diagnosis not present

## 2024-04-04 DIAGNOSIS — C3411 Malignant neoplasm of upper lobe, right bronchus or lung: Secondary | ICD-10-CM | POA: Diagnosis not present

## 2024-04-04 DIAGNOSIS — R918 Other nonspecific abnormal finding of lung field: Secondary | ICD-10-CM | POA: Diagnosis not present

## 2024-04-04 MED ORDER — IOHEXOL 300 MG/ML  SOLN
100.0000 mL | Freq: Once | INTRAMUSCULAR | Status: AC | PRN
  Administered 2024-04-04: 100 mL via INTRAVENOUS

## 2024-04-09 ENCOUNTER — Inpatient Hospital Stay: Attending: Internal Medicine | Admitting: Internal Medicine

## 2024-04-09 ENCOUNTER — Inpatient Hospital Stay: Attending: Internal Medicine

## 2024-04-09 VITALS — BP 129/68 | HR 98 | Temp 98.2°F | Resp 16 | Ht 64.5 in | Wt 168.0 lb

## 2024-04-09 DIAGNOSIS — Z90710 Acquired absence of both cervix and uterus: Secondary | ICD-10-CM | POA: Insufficient documentation

## 2024-04-09 DIAGNOSIS — C3411 Malignant neoplasm of upper lobe, right bronchus or lung: Secondary | ICD-10-CM | POA: Diagnosis not present

## 2024-04-09 DIAGNOSIS — C3490 Malignant neoplasm of unspecified part of unspecified bronchus or lung: Secondary | ICD-10-CM

## 2024-04-09 DIAGNOSIS — D649 Anemia, unspecified: Secondary | ICD-10-CM | POA: Diagnosis not present

## 2024-04-09 DIAGNOSIS — Z8541 Personal history of malignant neoplasm of cervix uteri: Secondary | ICD-10-CM | POA: Insufficient documentation

## 2024-04-09 DIAGNOSIS — Z7984 Long term (current) use of oral hypoglycemic drugs: Secondary | ICD-10-CM | POA: Insufficient documentation

## 2024-04-09 DIAGNOSIS — C7931 Secondary malignant neoplasm of brain: Secondary | ICD-10-CM | POA: Insufficient documentation

## 2024-04-09 DIAGNOSIS — Z7982 Long term (current) use of aspirin: Secondary | ICD-10-CM | POA: Diagnosis not present

## 2024-04-09 DIAGNOSIS — Z79899 Other long term (current) drug therapy: Secondary | ICD-10-CM | POA: Insufficient documentation

## 2024-04-09 DIAGNOSIS — Z923 Personal history of irradiation: Secondary | ICD-10-CM | POA: Insufficient documentation

## 2024-04-09 DIAGNOSIS — Z5111 Encounter for antineoplastic chemotherapy: Secondary | ICD-10-CM | POA: Insufficient documentation

## 2024-04-09 LAB — CMP (CANCER CENTER ONLY)
ALT: 8 U/L (ref 0–44)
AST: 10 U/L — ABNORMAL LOW (ref 15–41)
Albumin: 3.8 g/dL (ref 3.5–5.0)
Alkaline Phosphatase: 102 U/L (ref 38–126)
Anion gap: 9 (ref 5–15)
BUN: 20 mg/dL (ref 8–23)
CO2: 25 mmol/L (ref 22–32)
Calcium: 9.4 mg/dL (ref 8.9–10.3)
Chloride: 107 mmol/L (ref 98–111)
Creatinine: 0.77 mg/dL (ref 0.44–1.00)
GFR, Estimated: >60 mL/min (ref >60)
Glucose, Bld: 166 mg/dL — ABNORMAL HIGH (ref 70–99)
Potassium: 3.9 mmol/L (ref 3.5–5.1)
Sodium: 141 mmol/L (ref 135–145)
Total Bilirubin: 0.5 mg/dL (ref 0.0–1.2)
Total Protein: 6.6 g/dL (ref 6.5–8.1)

## 2024-04-09 LAB — CBC WITH DIFFERENTIAL (CANCER CENTER ONLY)
Abs Immature Granulocytes: 0.08 K/uL — ABNORMAL HIGH (ref 0.00–0.07)
Basophils Absolute: 0 K/uL (ref 0.0–0.1)
Basophils Relative: 0 %
Eosinophils Absolute: 0 K/uL (ref 0.0–0.5)
Eosinophils Relative: 0 %
HCT: 36.2 % (ref 36.0–46.0)
Hemoglobin: 11.7 g/dL — ABNORMAL LOW (ref 12.0–15.0)
Immature Granulocytes: 1 %
Lymphocytes Relative: 6 %
Lymphs Abs: 0.7 K/uL (ref 0.7–4.0)
MCH: 31.5 pg (ref 26.0–34.0)
MCHC: 32.3 g/dL (ref 30.0–36.0)
MCV: 97.3 fL (ref 80.0–100.0)
Monocytes Absolute: 1 K/uL (ref 0.1–1.0)
Monocytes Relative: 9 %
Neutro Abs: 10.3 K/uL — ABNORMAL HIGH (ref 1.7–7.7)
Neutrophils Relative %: 84 %
Platelet Count: 294 K/uL (ref 150–400)
RBC: 3.72 MIL/uL — ABNORMAL LOW (ref 3.87–5.11)
RDW: 15.4 % (ref 11.5–15.5)
WBC Count: 12.1 K/uL — ABNORMAL HIGH (ref 4.0–10.5)
nRBC: 0 % (ref 0.0–0.2)

## 2024-04-09 MED ORDER — DEXAMETHASONE SODIUM PHOSPHATE 10 MG/ML IJ SOLN
10.0000 mg | Freq: Once | INTRAMUSCULAR | Status: AC
  Administered 2024-04-09 (×2): 10 mg via INTRAVENOUS
  Filled 2024-04-09: qty 1

## 2024-04-09 MED ORDER — SODIUM CHLORIDE 0.9 % IV SOLN
500.0000 mg/m2 | Freq: Once | INTRAVENOUS | Status: AC
  Administered 2024-04-09 (×2): 900 mg via INTRAVENOUS
  Filled 2024-04-09: qty 20

## 2024-04-09 MED ORDER — SODIUM CHLORIDE 0.9 % IV SOLN: Freq: Once | INTRAVENOUS | Status: AC

## 2024-04-09 MED ORDER — CYANOCOBALAMIN 1000 MCG/ML IJ SOLN: 1000.0000 ug | Freq: Once | INTRAMUSCULAR | Status: DC

## 2024-04-09 NOTE — Patient Instructions (Signed)
 [facility] - A DEPT OF [facility]  Discharge Instructions: Thank you for choosing [facility] to provide your oncology and hematology care.   If you have a lab appointment with the Cancer Center, please go directly to the Cancer Center and check in at the registration area.   Wear comfortable clothing and clothing appropriate for easy access to any Portacath or PICC line.   We strive to give you quality time with your provider. You may need to reschedule your appointment if you arrive late (15 or more minutes).  Arriving late affects you and other patients whose appointments are after yours.  Also, if you miss three or more appointments without notifying the office, you may be dismissed from the clinic at the provider's discretion.      For prescription refill requests, have your pharmacy contact our office and allow 72 hours for refills to be completed.    Today you received the following chemotherapy and/or immunotherapy agents Alimta      To help prevent nausea and vomiting after your treatment, we encourage you to take your nausea medication as directed.  BELOW ARE SYMPTOMS THAT SHOULD BE REPORTED IMMEDIATELY: *FEVER GREATER THAN 100.4 F (38 C) OR HIGHER *CHILLS OR SWEATING *NAUSEA AND VOMITING THAT IS NOT CONTROLLED WITH YOUR NAUSEA MEDICATION *UNUSUAL SHORTNESS OF BREATH *UNUSUAL BRUISING OR BLEEDING *URINARY PROBLEMS (pain or burning when urinating, or frequent urination) *BOWEL PROBLEMS (unusual diarrhea, constipation, pain near the anus) TENDERNESS IN MOUTH AND THROAT WITH OR WITHOUT PRESENCE OF ULCERS (sore throat, sores in mouth, or a toothache) UNUSUAL RASH, SWELLING OR PAIN  UNUSUAL VAGINAL DISCHARGE OR ITCHING   Items with * indicate a potential emergency and should be followed up as soon as possible or go to the Emergency Department if any problems should occur.  Please show the CHEMOTHERAPY ALERT CARD or IMMUNOTHERAPY  ALERT CARD at check-in to the Emergency Department and triage nurse.  Should you have questions after your visit or need to cancel or reschedule your appointment, please contact [facility] - A DEPT OF [facility]  Dept: [phone]  and follow the prompts.  Office hours are 8:00 a.m. to 4:30 p.m. Monday - Friday. Please note that voicemails left after 4:00 p.m. may not be returned until the following business day.  We are closed weekends and major holidays. You have access to a nurse at all times for urgent questions. Please call the main number to the clinic Dept: [phone] and follow the prompts.   For any non-urgent questions, you may also contact your provider using MyChart. We now offer e-Visits for anyone 79 and older to request care online for non-urgent symptoms. For details visit [email or web address].   Also download the MyChart app! Go to the app store, search "MyChart", open the app, select [facility], and log in with your MyChart username and password.

## 2024-04-09 NOTE — Progress Notes (Signed)
 [facility] Telephone:([phone]) [phone]   [phone]) [phone]  OFFICE PROGRESS NOTE  [name], MD [address] [place] [address]  DIAGNOSIS: Stage IV (T2a, N0, M1b) non-small cell lung cancer, adenocarcinoma with negative EGFR and ALK mutations diagnosed in January 2015 and presented with right upper lobe lung mass in addition to a solitary brain metastasis.  PRIOR THERAPY: 1) Status post stereotactic radiotherapy to a solitary right parietal brain lesion under the care of Dr. Dewey on 10/16/2013. 2) Status post palliative radiotherapy to the right lung tumor under the care of Dr. Dewey completed on 12/05/2013. 3) Systemic chemotherapy with carboplatin  for AUC of 5 and Alimta  500 mg/M2 every 3 weeks. First dose Jan 06 2014. Status post 6 cycles.  CURRENT THERAPY: Systemic chemotherapy with maintenance Alimta  500 MG/M2 every 3 weeks, status post 169 cycles.  INTERVAL HISTORY: Alexis [name] 73 y.o. female returns to the clinic today for follow-up visit accompanied by her husband Nadara. Discussed the use of AI scribe software for clinical note transcription with the patient, who gave verbal consent to proceed.  History of Present Illness Alexis [name] is a 73 year old female with stage four non-small cell lung cancer who presents for evaluation with repeat CT scan for restaging of her disease. She is accompanied by her husband, Nadara.  She has a history of stage four non-small cell lung cancer, adenocarcinoma, diagnosed in January 2015. She has undergone systemic chemotherapy with carboplatin  and Alimta  every three weeks for six cycles, followed by maintenance treatment with single-agent Alimta . She has completed 169 cycles of treatment.  She reports a recent issue with her leg, which she suspects might be due to 'flea bites' or exposure to 'Roundup'. She describes the appearance of pus on the leg and has seen two different doctors for this issue. A nurse  performed a needle aspiration and sent it for analysis, but the results were inconclusive. She was prescribed a ten-day course of antibiotics and a cream, which has led to improvement in her symptoms.  She recently underwent a cystoscopy on August 6th, which showed no issues with her bladder. This procedure was performed by her urologist, Dr. Norleen Loan.  She mentions difficulty sleeping and has had her medication dosage adjusted, which has improved her sleep. She notes that her sleep pattern is usually disrupted during the summer months.  She is also due for a mammogram, a bone density test, and a colonoscopy. She has an MRI scheduled for December 3rd.      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  MEDICATIONS:  Current Outpatient Medications  Medication Sig Dispense Refill   acetaminophen  (TYLENOL ) 500 MG tablet Take 500 mg by mouth every 6 (six) hours as needed for mild pain or headache. Reported on 11/02/2015     Ascorbic Acid (VITAMIN C GUMMIE PO) Take 1 each by mouth every morning.     aspirin  EC 81 MG tablet Take 1 tablet (81 mg total) by mouth daily. 150  tablet 2   Cranberry 240 MG CAPS Take 1 capsule by mouth daily. This is in her probiotic     [employer] ACID CONTROLLER 10 MG tablet SMARTSIG:1 Tablet(s) By Mouth Every 12 Hours PRN     dexamethasone  (DECADRON ) 4 MG tablet TAKE 1 TABLET TWICE A DAY THE DAY BEFORE, THE DAY OF, AND THE DAY AFTER CHEMOTHERAPY. 40 tablet 2   estradiol (ESTRACE) 0.1 MG/GM vaginal cream Place 1 Applicatorful vaginally daily.     folic acid   (FOLVITE ) 1 MG tablet TAKE 1 TABLET BY MOUTH EVERY DAY 90 tablet 0   glimepiride  (AMARYL ) 2 MG tablet TAKE 1 TABLET BY MOUTH DAILY BEFORE BREAKFAST. 90 tablet 0   loratadine (CLARITIN) 10 MG tablet Take 10 mg by mouth daily.     Multiple Vitamin (MULTIVITAMIN WITH MINERALS) TABS tablet Take 1 tablet by mouth every morning.     omeprazole  (PRILOSEC) 20 MG capsule TAKE 1 CAPSULE BY MOUTH EVERY DAY 90 capsule 0   ondansetron  (ZOFRAN ) 8 MG tablet TAKE 1 TABLET BY MOUTH BEFORE CHEMO 30 tablet 1   phenazopyridine  (PYRIDIUM ) 200 MG tablet Take 1 tablet (200 mg total) by mouth every 8 (eight) hours as needed. 10 tablet 0   prochlorperazine  (COMPAZINE ) 10 MG tablet Take 1 tablet (10 mg total) by mouth every 6 (six) hours as needed for nausea or vomiting. 60 tablet 0   rosuvastatin  (CRESTOR ) 10 MG tablet Take 1 tablet (10 mg total) by mouth daily. 30 tablet 12   senna-docusate (SENOKOT-S) 8.6-50 MG tablet Take 1 tablet by mouth daily. 30 tablet prn   temazepam  (RESTORIL ) 22.5 MG capsule Take 1 capsule (22.5 mg total) by mouth at bedtime as needed for sleep. 30 capsule 0   trimethoprim  (TRIMPEX ) 100 MG tablet Take 100 mg by mouth daily.     Vitamin D, Ergocalciferol, (DRISDOL) 1.25 MG (50000 UNIT) CAPS capsule Take 50,000 Units by mouth once a week. Vit d 3     No current facility-administered medications for this visit.    SURGICAL HISTORY:  Past Surgical History:  Procedure Laterality Date   ABDOMINAL HYSTERECTOMY     COLOSTOMY TAKEDOWN N/A 07/10/2014   Procedure: LAPAROSCOPIC LYSIS OF ADHESIONS (90 MIN) LAPAROSCOPIC ASSISTED COLOSTOMY CLOSURE, RIGID PROCTOSIGMOIDOSCOPY;  Surgeon: Krystal Russell, MD;  Location: [facility];  Service: General;  Laterality: N/A;   LAPAROTOMY N/A 11/03/2013   Procedure: EXPLORATORY LAPAROTOMY, DRAINAGE OF INTRA  ABDOMINAL ABSCESSES, MOBILIZATION OF SPLENIC FLEXURE, SIGMOID COLECTOMY WITH COLOSTOMY;  Surgeon: Krystal JINNY Russell, MD;  Location: [facility];  Service: General;   Laterality: N/A;   VIDEO BRONCHOSCOPY Bilateral 08/30/2013   Procedure: VIDEO BRONCHOSCOPY WITH FLUORO;  Surgeon: Ozell KATHEE America, MD;  Location: [facility];  Service: Cardiopulmonary;  Laterality: Bilateral;    REVIEW OF SYSTEMS:  Constitutional: positive for fatigue Eyes: negative Ears, nose, mouth, throat, and face: negative Respiratory: negative Cardiovascular: negative Gastrointestinal: negative Genitourinary:negative Integument/breast: negative Hematologic/lymphatic: negative Musculoskeletal:negative Neurological: negative Behavioral/Psych: positive for sleep disturbance Endocrine: negative Allergic/Immunologic: negative   PHYSICAL EXAMINATION: General appearance: alert, cooperative, and no distress Head: Normocephalic, without obvious abnormality, atraumatic Neck: no adenopathy, no JVD, supple, symmetrical, trachea midline, and thyroid  not enlarged, symmetric, no tenderness/mass/nodules Lymph nodes: Cervical, supraclavicular, and axillary nodes normal. Resp: clear to auscultation bilaterally Back: symmetric, no curvature. ROM normal. No CVA tenderness. Cardio: regular rate and rhythm, S1, S2 normal, no murmur, click, rub or gallop GI: soft, non-tender; bowel sounds normal; no masses,  no organomegaly Extremities: extremities normal, atraumatic, no cyanosis or  edema Neurologic: Alert and oriented X 3, normal strength and tone. Normal symmetric reflexes. Normal coordination and gait   ECOG PERFORMANCE STATUS: 1 - Symptomatic but completely ambulatory   Blood pressure 129/68, pulse 98, temperature 98.2 F (36.8 C), temperature source Temporal, resp. rate 16, height 5' 4.5 (1.638 m), weight 168 lb (76.2 kg), SpO2 100%.  LABORATORY DATA: Lab Results  Component Value Date   WBC 12.1 (H) 04/09/2024   HGB 11.7 (L) 04/09/2024   HCT 36.2 04/09/2024   MCV 97.3 04/09/2024   PLT 294 04/09/2024      Chemistry      Component Value Date/Time   NA 141 04/09/2024 0836   NA 140  11/03/2022 0844   NA 139 08/14/2017 0837   K 3.9 04/09/2024 0836   K 4.4 08/14/2017 0837   CL 107 04/09/2024 0836   CO2 25 04/09/2024 0836   CO2 24 08/14/2017 0837   BUN 20 04/09/2024 0836   BUN 33 (H) 11/03/2022 0844   BUN 13.3 08/14/2017 0837   CREATININE 0.77 04/09/2024 0836   CREATININE 0.8 08/14/2017 0837      Component Value Date/Time   CALCIUM  9.4 04/09/2024 0836   CALCIUM  9.3 08/14/2017 0837   ALKPHOS 102 04/09/2024 0836   ALKPHOS 107 08/14/2017 0837   AST 10 (L) 04/09/2024 0836   AST 12 08/14/2017 0837   ALT 8 04/09/2024 0836   ALT 12 08/14/2017 0837   BILITOT 0.5 04/09/2024 0836   BILITOT 0.37 08/14/2017 0837       RADIOGRAPHIC STUDIES: No results found.    ASSESSMENT AND PLAN:  This is a very pleasant 73 years old white female with metastatic non-small cell lung cancer, adenocarcinoma status post induction systemic chemotherapy with carboplatin  and Alimta  with partial response.  The patient has no actionable EGFR or ALK mutations at the time of her diagnosis. The patient is currently on maintenance treatment with single agent Alimta  status post 169 cycles.  She has been tolerating this treatment fairly well with no concerning adverse effects. She had repeat CT scan of the chest, abdomen and pelvis performed recently.  The final report is still pending but I personally independently reviewed the scan images and I did not see any clear evidence for disease progression. Assessment and Plan Assessment & Plan Stage IV non-small cell lung cancer, adenocarcinoma Stage IV non-small cell lung cancer, adenocarcinoma, diagnosed in January 2015. Currently post systemic chemotherapy with carboplatin  and Alimta  every three weeks for six cycles, followed by maintenance treatment with single agent Alimta . Completed 169 cycles, with today being the 170th cycle. Recent CT scan of the chest, abdomen, and pelvis for restaging shows no concerning findings. - Administer 170th cycle of  Alimta  today - Arrange CT scan in three months - Schedule follow-up appointment one week after the CT scan - Cancel all chemotherapy appointments after today's cycle - Reassess treatment plan if the final scan report shows concerning findings  Pruritic rash of the leg, improving Pruritic rash on the leg, possibly due to flea bites, improving with antibiotic treatment and topical cream. Differential diagnosis included insect bites. - Continue current treatment with antibiotics and topical cream For the anemia, the patient will continue on the oral iron tablets. For the insomnia, she is currently on Restoril  22.5 mg p.o. nightly She was advised to call immediately if she has any concerning symptoms in the interval The patient voices understanding of current disease status and treatment options and is in agreement with the current care  plan. All questions were answered. The patient knows to call the clinic with any problems, questions or concerns. We can certainly see the patient much sooner if necessary.  Disclaimer: This note was dictated with voice recognition software. Similar sounding words can inadvertently be transcribed and may not be corrected upon review.

## 2024-04-10 ENCOUNTER — Telehealth: Payer: Self-pay | Admitting: Internal Medicine

## 2024-04-10 ENCOUNTER — Other Ambulatory Visit: Payer: Self-pay | Admitting: Family Medicine

## 2024-04-10 DIAGNOSIS — Z1231 Encounter for screening mammogram for malignant neoplasm of breast: Secondary | ICD-10-CM

## 2024-04-10 NOTE — Telephone Encounter (Signed)
 Scheduled appointments with the Patient per 8/13 LOS.

## 2024-04-16 ENCOUNTER — Ambulatory Visit
Admission: RE | Admit: 2024-04-16 | Discharge: 2024-04-16 | Disposition: A | Discharge status: Home / Self Care | Source: Ambulatory Visit | Attending: Family Medicine | Admitting: Family Medicine

## 2024-04-16 DIAGNOSIS — Z1231 Encounter for screening mammogram for malignant neoplasm of breast: Secondary | ICD-10-CM | POA: Diagnosis not present

## 2024-04-23 ENCOUNTER — Other Ambulatory Visit: Payer: Self-pay | Admitting: Family Medicine

## 2024-04-23 ENCOUNTER — Telehealth: Payer: Self-pay

## 2024-04-23 ENCOUNTER — Telehealth: Payer: Self-pay | Admitting: Family Medicine

## 2024-04-23 DIAGNOSIS — R928 Other abnormal and inconclusive findings on diagnostic imaging of breast: Secondary | ICD-10-CM

## 2024-04-23 NOTE — Telephone Encounter (Signed)
 Called and spoke to the patient. Answered questions regarding imaging.  Pt has appt Thursday for repeat breast imaging.

## 2024-04-23 NOTE — Telephone Encounter (Signed)
 Copied from CRM #[id number]. Topic: General - Other >> Apr 23, 2024  8:31 AM Tiffini S wrote: Reason for CRM: Jonette [phone] ext: [phone] with  [facility] called about a order for a mammogram/ ultrasound that she will fax over for Dr. Flynn to sign. Ask that the patient is contacted- she has questions and would like a call back at [phone].

## 2024-04-23 NOTE — Telephone Encounter (Signed)
 Copied from CRM #[id number]. Topic: Clinical - Lab/Test Results >> Apr 23, 2024  8:32 AM Anairis L wrote: Reason for CRM: Patient is requesting a call back regarding mammogram results of her left breast finding. She will have second exam on 08/28 but need Dr. Chandra to ok it. She is requesting a call back. Thank you.

## 2024-04-25 ENCOUNTER — Ambulatory Visit
Admission: RE | Admit: 2024-04-25 | Discharge: 2024-04-25 | Disposition: A | Discharge status: Home / Self Care | Source: Ambulatory Visit | Attending: Family Medicine | Admitting: Family Medicine

## 2024-04-25 DIAGNOSIS — R928 Other abnormal and inconclusive findings on diagnostic imaging of breast: Secondary | ICD-10-CM

## 2024-04-26 ENCOUNTER — Ambulatory Visit: Payer: Self-pay | Admitting: Family Medicine

## 2024-04-30 ENCOUNTER — Ambulatory Visit

## 2024-04-30 ENCOUNTER — Other Ambulatory Visit

## 2024-04-30 ENCOUNTER — Ambulatory Visit: Admitting: Physician Assistant

## 2024-05-06 DIAGNOSIS — Z1211 Encounter for screening for malignant neoplasm of colon: Secondary | ICD-10-CM | POA: Diagnosis not present

## 2024-05-06 DIAGNOSIS — K219 Gastro-esophageal reflux disease without esophagitis: Secondary | ICD-10-CM | POA: Diagnosis not present

## 2024-05-09 ENCOUNTER — Telehealth: Payer: Self-pay

## 2024-05-09 NOTE — Telephone Encounter (Signed)
 Faxed signed clearance form to [facility] with confirmation @ [phone].

## 2024-05-20 ENCOUNTER — Other Ambulatory Visit

## 2024-05-20 ENCOUNTER — Ambulatory Visit: Admitting: Physician Assistant

## 2024-05-20 ENCOUNTER — Ambulatory Visit

## 2024-06-04 DIAGNOSIS — Z980 Intestinal bypass and anastomosis status: Secondary | ICD-10-CM | POA: Diagnosis not present

## 2024-06-04 DIAGNOSIS — Z1211 Encounter for screening for malignant neoplasm of colon: Secondary | ICD-10-CM | POA: Diagnosis not present

## 2024-06-04 DIAGNOSIS — K5730 Diverticulosis of large intestine without perforation or abscess without bleeding: Secondary | ICD-10-CM | POA: Diagnosis not present

## 2024-06-04 DIAGNOSIS — K649 Unspecified hemorrhoids: Secondary | ICD-10-CM | POA: Diagnosis not present

## 2024-06-04 DIAGNOSIS — D175 Benign lipomatous neoplasm of intra-abdominal organs: Secondary | ICD-10-CM | POA: Diagnosis not present

## 2024-06-04 LAB — HM COLONOSCOPY

## 2024-06-13 ENCOUNTER — Other Ambulatory Visit: Payer: Self-pay | Admitting: Physician Assistant

## 2024-06-13 DIAGNOSIS — C3411 Malignant neoplasm of upper lobe, right bronchus or lung: Secondary | ICD-10-CM

## 2024-06-14 ENCOUNTER — Other Ambulatory Visit: Payer: Self-pay | Admitting: Physician Assistant

## 2024-06-14 DIAGNOSIS — C3490 Malignant neoplasm of unspecified part of unspecified bronchus or lung: Secondary | ICD-10-CM

## 2024-06-18 ENCOUNTER — Ambulatory Visit: Admitting: Family Medicine

## 2024-06-18 ENCOUNTER — Encounter: Payer: Self-pay | Admitting: Family Medicine

## 2024-06-18 VITALS — BP 130/77 | HR 91 | Ht 64.5 in | Wt 165.4 lb

## 2024-06-18 DIAGNOSIS — E119 Type 2 diabetes mellitus without complications: Secondary | ICD-10-CM | POA: Diagnosis not present

## 2024-06-18 DIAGNOSIS — E1169 Type 2 diabetes mellitus with other specified complication: Secondary | ICD-10-CM

## 2024-06-18 DIAGNOSIS — E785 Hyperlipidemia, unspecified: Secondary | ICD-10-CM

## 2024-06-18 DIAGNOSIS — K5790 Diverticulosis of intestine, part unspecified, without perforation or abscess without bleeding: Secondary | ICD-10-CM | POA: Diagnosis not present

## 2024-06-18 DIAGNOSIS — G4700 Insomnia, unspecified: Secondary | ICD-10-CM | POA: Diagnosis not present

## 2024-06-18 DIAGNOSIS — Z23 Encounter for immunization: Secondary | ICD-10-CM | POA: Diagnosis not present

## 2024-06-18 DIAGNOSIS — Z0000 Encounter for general adult medical examination without abnormal findings: Secondary | ICD-10-CM | POA: Insufficient documentation

## 2024-06-18 LAB — POCT GLYCOSYLATED HEMOGLOBIN (HGB A1C): HbA1c POC (<> result, manual entry): 5.7 % (ref 4.0–5.6)

## 2024-06-18 MED ORDER — DOXEPIN HCL 10 MG PO CAPS: 10.0000 mg | ORAL_CAPSULE | Freq: Every day | ORAL | 2 refills | Status: DC

## 2024-06-18 NOTE — Assessment & Plan Note (Signed)
 A1c 5.7 today.  Continue with diet control.

## 2024-06-18 NOTE — Assessment & Plan Note (Signed)
-   Reports chronic difficulty with sleep maintenance. Prior trial of temazepam  was effective but was used almost nightly. Counseled on risks of dependence, confusion, and falls with long-term benzodiazepine use, especially with increasing age. - Will trial doxepin 10 mg at bedtime. This must be taken every night and is a safer alternative for long-term use. - If doxepin is not effective, may consider intermittent use of temazepam , limited to a few times per week to reduce habit-forming potential. - Prescription for doxepin sent to pharmacy.

## 2024-06-18 NOTE — Assessment & Plan Note (Signed)
-   Recent colonoscopy and mammogram/ultrasound were negative. - Discussed alternative locations for future mammograms. [facility] [place] - Plan to obtain flu shot today. - Has upcoming appointments for lab work,  and follow-up with Dr. Deatrice on 07/09/2024. - Follow up here in 3 months.

## 2024-06-18 NOTE — Patient Instructions (Signed)
 It was nice to see you today,  We addressed the following topics today:  - I am sending a prescription for Doxepin 10mg  to your pharmacy. Please try taking one tablet every night for sleep. - You should continue using the Debrox drops for your ear as you have been. - Please get your flu shot today before you leave. - We will see you back in the office in three months for a follow-up.  Have a great day,  Rolan Slain, MD

## 2024-06-18 NOTE — Progress Notes (Signed)
 Established Patient Office Visit  Subjective   Patient ID: Alexis [name], female    DOB: February 12, 1951  Age: 73 y.o. MRN: [id number]  Chief Complaint  Patient presents with   Medical Management of Chronic Issues    HPI  Subjective - Follow-up for multiple issues. No new concerns.  - Recent colonoscopy was normal. Second one in 10 years, both negative. Reports being told no more are needed.  - Mammogram and ultrasound were negative. Recommends follow-up in one year. Reports feeling nervous and unwell during the procedure. Inquires about alternative imaging locations like the hospital in [place] or the cancer center.  - Reports history of diverticulitis with colectomy (7 inches of large intestine removed) . Wore an ostomy bag for nine months post-operatively. Recent colonoscopy noted some remaining diverticula left by the previous surgeon. Experiences intermittent abdominal pain depending on food intake.  - Reports insomnia. Has difficulty staying asleep, often waking at 1:30 AM or 3:00 AM and being unable to return to sleep. Denies daytime naps. Reports mind racing at times.  Medications Reports taking aspirin  and an unspecified medication for indigestion. Previously took temazepam  (Restoril ) 15 mg, then 22.5 mg, for sleep as needed, which was effective. Took it almost every night. Has not taken it since the end of August. Denies trying other over-the-counter or prescription sleep aids. Reports prior use of Xanax .  PMH, PSH, FH, Social Hx PMHx: History of diverticulitis, Type 2 Diabetes Mellitus (A1C improved from 6.7 to 5.7 with diet changes and 2 lb weight loss), unspecified spot on lung, oncologist has placed on a break after 170 treatments. PSHx: Colonoscopy (recent, normal), partial colectomy for diverticulitis, cystoscopy (by Dr. Atlas, recent), mammogram with ultrasound (recent, negative).  ROS Constitutional: Reports feeling well overall. Denies fatigue or dizziness from  temazepam . GI: Intermittent abdominal pain. Denies other GI symptoms. Psych: Reports trouble sleeping, mind racing.   The 10-year ASCVD risk score (Arnett DK, et al., 2019) is: 22.2%  Health Maintenance Due  Topic Date Due   OPHTHALMOLOGY EXAM  Never done   Hepatitis C Screening  Never done   Pneumococcal Vaccine: 50+ Years (2 of 2 - PCV) 07/26/2020   FOOT EXAM  09/06/2022   Medicare Annual Wellness (AWV)  11/10/2023   COVID-19 Vaccine (5 - 2025-26 season) 04/29/2024      Objective:     BP 130/77   Pulse 91   Ht 5' 4.5 (1.638 m)   Wt 165 lb 6.4 oz (75 kg)   SpO2 99%   BMI 27.95 kg/m    Physical Exam Gen: alert, oriented Heent: non obstructing soft cerumen b/l.  Pulm: no respiratory distress Psych: pleasant affect   Results for orders placed or performed in visit on 06/18/24  POCT glycosylated hemoglobin (Hb A1C)  Result Value Ref Range   Hemoglobin A1C     HbA1c POC (<> result, manual entry) 5.7 4.0 - 5.6 %   HbA1c, POC (prediabetic range)     HbA1c, POC (controlled diabetic range)          Assessment & Plan:   Insomnia, unspecified type Assessment & Plan: - Reports chronic difficulty with sleep maintenance. Prior trial of temazepam  was effective but was used almost nightly. Counseled on risks of dependence, confusion, and falls with long-term benzodiazepine use, especially with increasing age. - Will trial doxepin 10 mg at bedtime. This must be taken every night and is a safer alternative for long-term use. - If doxepin is not effective, may consider intermittent use  of temazepam , limited to a few times per week to reduce habit-forming potential. - Prescription for doxepin sent to pharmacy.   Diverticulosis Assessment & Plan: - Status post partial colectomy with some remaining diverticula noted on recent colonoscopy. Experiences intermittent, diet-related abdominal pain. - Counseled that it is difficult to predict if remaining diverticula will cause  future issues like infection or bleeding. - Continue to monitor.   Diabetes mellitus without complication (HCC) Assessment & Plan: A1c 5.7 today.  Continue with diet control.    Orders: -     POCT glycosylated hemoglobin (Hb A1C)  Hyperlipidemia due to type 2 diabetes mellitus (HCC) -     POCT glycosylated hemoglobin (Hb A1C)  Immunization due -     Flu vaccine HIGH DOSE PF(Fluzone Trivalent)  Healthcare maintenance Assessment & Plan: - Recent colonoscopy and mammogram/ultrasound were negative. - Discussed alternative locations for future mammograms. [facility] in [place] - Plan to obtain flu shot today. - Has upcoming appointments for lab work,  and follow-up with Dr. Deatrice on 07/09/2024. - Follow up here in 3 months.   Other orders -     Doxepin HCl; Take 1 capsule (10 mg total) by mouth at bedtime. 30 minutes prior to sleeping  Dispense: 30 capsule; Refill: 2   I spent 30 min in the mgmt of this patient  Return in about 3 months (around 09/18/2024) for DM.    Alexis MARLA Slain, MD

## 2024-06-18 NOTE — Assessment & Plan Note (Signed)
-   Status post partial colectomy with some remaining diverticula noted on recent colonoscopy. Experiences intermittent, diet-related abdominal pain. - Counseled that it is difficult to predict if remaining diverticula will cause future issues like infection or bleeding. - Continue to monitor.

## 2024-06-19 ENCOUNTER — Other Ambulatory Visit: Payer: Self-pay

## 2024-06-20 ENCOUNTER — Ambulatory Visit: Payer: Self-pay

## 2024-06-20 NOTE — Telephone Encounter (Signed)
 Please let the pt know to stop taking the medication.  I will add it to her allergy list.

## 2024-06-20 NOTE — Telephone Encounter (Signed)
 Called pt she is advised also she stated that she would like to have something in the place of medication. Her flu vaccine that was given other day she said it has redness and hot to touch I suggested to take tylenol  and put a cold compression on it. she would like for you to respone back with any suggestion.

## 2024-06-20 NOTE — Telephone Encounter (Signed)
 Pts husband requesting call re: possible medication d/t allergic reaction (not via MyChart please).   FYI Only or Action Required?: Action required by provider: clinical question for provider.  Patient was last seen in primary care on 06/18/2024 by [name], MD.  Called Nurse Triage reporting Medication Reaction.  Symptoms began yesterday.  Interventions attempted: Nothing.  Symptoms are: completely resolved.  Triage Disposition: Discuss With PCP and Callback by Nurse Today (overriding Call PCP Now)  Patient/caregiver understands and will follow disposition?:   Reason for Disposition  [1] Caller has URGENT medicine question about med that primary care doctor (or NP/PA) or specialist prescribed AND [2] triager unable to answer question  Answer Assessment - Initial Assessment Questions Pts husband states pt took 2 doses of doxepin and each time, experienced oral and facial tingling and pruritus. Symptoms began approximately 6 hrs after taking the medication and resolved approximately 2-4 hours after.   Pt is completely asymptomatic at this time. Pt states she will discontinue the medication until further advice.   1. NAME of MEDICINE: What medicine(s) are you calling about?     Doxepin  2. QUESTION: What is your question? (e.g., double dose of medicine, side effect)     Pt had allergic rx to new medication  3. PRESCRIBER: Who prescribed the medicine? Reason: if prescribed by specialist, call should be referred to that group.     Dr. Chandra  4. SYMPTOMS: Do you have any symptoms? If Yes, ask: What symptoms are you having?  How bad are the symptoms (e.g., mild, moderate, severe)     See above, resolved  Protocols used: Medication Question Call-A-AH Message from [place] D sent at 06/20/2024  9:14 AM EDT  Pt husband thinks she's allergic to the medication- doxepin (SINEQUAN) 10 MG capsule After taking it her face starts tingle around lips, eyes and cheeks. She's  taken it twice and it has happened both times she hasn't taken it today  [phone]

## 2024-06-21 MED ORDER — RAMELTEON 8 MG PO TABS: 8.0000 mg | ORAL_TABLET | Freq: Every day | ORAL | 1 refills | Status: DC

## 2024-06-21 NOTE — Addendum Note (Signed)
 Addended by: [name] on: 06/21/2024 06:35 AM   Modules accepted: Orders

## 2024-06-21 NOTE — Telephone Encounter (Signed)
 Let the patient know that I agree that tylenol  and a cold compress on her arm would be the best initial step for her arm pain.   Let her know I will send in a prescription for a medication called ramelteon that may take a few days to be approved if it requires a prior authorization.    It is taken the same way as the other sleep medications, 30 minutes to an hour before bed.

## 2024-06-21 NOTE — Telephone Encounter (Signed)
 Called pt spoke with husband he said she ate candy last night here upper lip started to tingle  check sugar level it was 192 will test today to see was that the cause. And will take another pill to see if her face tingle they want to see which one causing it.

## 2024-07-02 ENCOUNTER — Ambulatory Visit (HOSPITAL_COMMUNITY)
Admission: RE | Admit: 2024-07-02 | Discharge: 2024-07-02 | Disposition: A | Discharge status: Home / Self Care | Source: Ambulatory Visit | Attending: Internal Medicine | Admitting: Internal Medicine

## 2024-07-02 ENCOUNTER — Inpatient Hospital Stay: Attending: Internal Medicine

## 2024-07-02 ENCOUNTER — Encounter (HOSPITAL_COMMUNITY): Payer: Self-pay

## 2024-07-02 DIAGNOSIS — Z7982 Long term (current) use of aspirin: Secondary | ICD-10-CM | POA: Insufficient documentation

## 2024-07-02 DIAGNOSIS — Z79899 Other long term (current) drug therapy: Secondary | ICD-10-CM | POA: Diagnosis not present

## 2024-07-02 DIAGNOSIS — Z923 Personal history of irradiation: Secondary | ICD-10-CM | POA: Insufficient documentation

## 2024-07-02 DIAGNOSIS — C3490 Malignant neoplasm of unspecified part of unspecified bronchus or lung: Secondary | ICD-10-CM

## 2024-07-02 DIAGNOSIS — C7931 Secondary malignant neoplasm of brain: Secondary | ICD-10-CM | POA: Diagnosis not present

## 2024-07-02 DIAGNOSIS — Z8541 Personal history of malignant neoplasm of cervix uteri: Secondary | ICD-10-CM | POA: Insufficient documentation

## 2024-07-02 DIAGNOSIS — N3289 Other specified disorders of bladder: Secondary | ICD-10-CM | POA: Diagnosis not present

## 2024-07-02 DIAGNOSIS — Z85118 Personal history of other malignant neoplasm of bronchus and lung: Secondary | ICD-10-CM | POA: Diagnosis not present

## 2024-07-02 DIAGNOSIS — Z933 Colostomy status: Secondary | ICD-10-CM | POA: Insufficient documentation

## 2024-07-02 DIAGNOSIS — C3411 Malignant neoplasm of upper lobe, right bronchus or lung: Secondary | ICD-10-CM | POA: Diagnosis not present

## 2024-07-02 LAB — CMP (CANCER CENTER ONLY)
ALT: 5 U/L (ref 0–44)
AST: 12 U/L — ABNORMAL LOW (ref 15–41)
Albumin: 3.8 g/dL (ref 3.5–5.0)
Alkaline Phosphatase: 89 U/L (ref 38–126)
Anion gap: 10 (ref 5–15)
BUN: 14 mg/dL (ref 8–23)
CO2: 26 mmol/L (ref 22–32)
Calcium: 9.6 mg/dL (ref 8.9–10.3)
Chloride: 104 mmol/L (ref 98–111)
Creatinine: 1.1 mg/dL — ABNORMAL HIGH (ref 0.44–1.00)
GFR, Estimated: 53 mL/min — ABNORMAL LOW (ref >60)
Glucose, Bld: 122 mg/dL — ABNORMAL HIGH (ref 70–99)
Potassium: 4 mmol/L (ref 3.5–5.1)
Sodium: 140 mmol/L (ref 135–145)
Total Bilirubin: 0.4 mg/dL (ref 0.0–1.2)
Total Protein: 7.1 g/dL (ref 6.5–8.1)

## 2024-07-02 LAB — CBC WITH DIFFERENTIAL (CANCER CENTER ONLY)
Abs Immature Granulocytes: 0.01 K/uL (ref 0.00–0.07)
Basophils Absolute: 0 K/uL (ref 0.0–0.1)
Basophils Relative: 0 %
Eosinophils Absolute: 0.3 K/uL (ref 0.0–0.5)
Eosinophils Relative: 4 %
HCT: 39.7 % (ref 36.0–46.0)
Hemoglobin: 12.8 g/dL (ref 12.0–15.0)
Immature Granulocytes: 0 %
Lymphocytes Relative: 20 %
Lymphs Abs: 1.2 K/uL (ref 0.7–4.0)
MCH: 30.3 pg (ref 26.0–34.0)
MCHC: 32.2 g/dL (ref 30.0–36.0)
MCV: 94.1 fL (ref 80.0–100.0)
Monocytes Absolute: 1 K/uL (ref 0.1–1.0)
Monocytes Relative: 17 %
Neutro Abs: 3.7 K/uL (ref 1.7–7.7)
Neutrophils Relative %: 59 %
Platelet Count: 225 K/uL (ref 150–400)
RBC: 4.22 MIL/uL (ref 3.87–5.11)
RDW: 13.8 % (ref 11.5–15.5)
WBC Count: 6.3 K/uL (ref 4.0–10.5)
nRBC: 0 % (ref 0.0–0.2)

## 2024-07-02 MED ORDER — SODIUM CHLORIDE (PF) 0.9 % IJ SOLN
INTRAMUSCULAR | Status: AC
  Filled 2024-07-02: qty 50

## 2024-07-02 MED ORDER — IOHEXOL 300 MG/ML  SOLN
100.0000 mL | Freq: Once | INTRAMUSCULAR | Status: AC | PRN
  Administered 2024-07-02: 100 mL via INTRAVENOUS

## 2024-07-09 ENCOUNTER — Inpatient Hospital Stay (HOSPITAL_BASED_OUTPATIENT_CLINIC_OR_DEPARTMENT_OTHER): Admitting: Internal Medicine

## 2024-07-09 VITALS — BP 109/71 | HR 110 | Temp 97.6°F | Resp 17 | Ht 64.5 in | Wt 163.0 lb

## 2024-07-09 DIAGNOSIS — Z923 Personal history of irradiation: Secondary | ICD-10-CM | POA: Diagnosis not present

## 2024-07-09 DIAGNOSIS — Z8541 Personal history of malignant neoplasm of cervix uteri: Secondary | ICD-10-CM | POA: Diagnosis not present

## 2024-07-09 DIAGNOSIS — C7931 Secondary malignant neoplasm of brain: Secondary | ICD-10-CM | POA: Diagnosis not present

## 2024-07-09 DIAGNOSIS — Z7982 Long term (current) use of aspirin: Secondary | ICD-10-CM | POA: Diagnosis not present

## 2024-07-09 DIAGNOSIS — Z79899 Other long term (current) drug therapy: Secondary | ICD-10-CM | POA: Diagnosis not present

## 2024-07-09 DIAGNOSIS — C3490 Malignant neoplasm of unspecified part of unspecified bronchus or lung: Secondary | ICD-10-CM

## 2024-07-09 DIAGNOSIS — C3411 Malignant neoplasm of upper lobe, right bronchus or lung: Secondary | ICD-10-CM | POA: Diagnosis not present

## 2024-07-09 DIAGNOSIS — N390 Urinary tract infection, site not specified: Secondary | ICD-10-CM | POA: Diagnosis not present

## 2024-07-09 DIAGNOSIS — R399 Unspecified symptoms and signs involving the genitourinary system: Secondary | ICD-10-CM | POA: Diagnosis not present

## 2024-07-09 NOTE — Progress Notes (Signed)
 [facility] [phone]   [phone]  OFFICE PROGRESS NOTE  Alexis Toribio POUR, MD [address] [place] [address]  DIAGNOSIS: Stage IV (T2a, N0, M1b) non-small cell lung cancer, adenocarcinoma with negative EGFR and ALK mutations diagnosed in January 2015 and presented with right upper lobe lung mass in addition to a solitary brain metastasis.  PRIOR THERAPY: 1) Status post stereotactic radiotherapy to a solitary right parietal brain lesion under the care of Dr. Dewey on 10/16/2013. 2) Status post palliative radiotherapy to the right lung tumor under the care of Dr. Dewey completed on 12/05/2013. 3) Systemic chemotherapy with carboplatin  for AUC of 5 and Alimta  500 mg/M2 every 3 weeks. First dose Jan 06 2014. Status post 6 cycles. 4) Systemic chemotherapy with maintenance Alimta  500 MG/M2 every 3 weeks, status post 170 cycles. Last dose of treatment was given on April 08, 2024.  The patient is currently on a break of chemotherapy.  CURRENT THERAPY: Observation.  INTERVAL HISTORY: Alexis [name] 73 y.o. female returns to the clinic today for follow-up visit accompanied by her husband Alexis [name]. Discussed the use of AI scribe software for clinical note transcription with the patient, who gave verbal consent to proceed.  History of Present Illness Alexis [name] is a 73 year old female with stage four non-small cell lung cancer, adenocarcinoma, who presents for evaluation with repeat CT scan for restaging of her disease. She is accompanied by her husband, Alexis [name].  She was diagnosed with stage four non-small cell lung cancer, adenocarcinoma, in January 2015. She has undergone systemic chemotherapy with carboplatin  and Alimta  every three weeks for six cycles, followed by maintenance treatment with single-agent Alimta  for 170 cycles. Recently, she was given a break from chemotherapy after achieving stable disease and is currently under observation.  During  the three-month break from chemotherapy, she has been attending various medical appointments. She reports that Dr. Sedonia, her urologist, performed a cystoscopy to check her bladder. She reports that she had a mammogram and a subsequent ultrasound for her left breast after something was seen that required further evaluation. She reports that after her colonoscopy, she was told by Dr. Jessy that she has some diverticulitis, but she feels fine. She also visited [facility] for foot alignment issues and received orthotic inserts for her shoes.  No chest pain, breathing issues, nausea, vomiting, or diarrhea. She mentions a slight burning sensation, which she plans to discuss with her urologist. She feels well overall and is planning to attend a [employer] concert with her husband.      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  MEDICATIONS:  Current Outpatient Medications  Medication Sig Dispense Refill   acetaminophen  (TYLENOL ) 500 MG tablet Take 500 mg by mouth every 6 (six) hours as needed for mild pain or headache. Reported on 11/02/2015     Ascorbic Acid (VITAMIN C GUMMIE PO) Take 1 each by mouth every morning.     aspirin  EC 81 MG tablet  Take 1 tablet (81 mg total) by mouth daily. 150 tablet 2   Cranberry 240 MG CAPS Take 1 capsule by mouth daily. This is in her probiotic     [employer] ACID CONTROLLER 10 MG tablet SMARTSIG:1 Tablet(s) By Mouth Every 12 Hours PRN     dexamethasone  (DECADRON ) 4 MG tablet TAKE 1 TABLET TWICE A DAY THE DAY BEFORE, THE DAY OF,  AND THE DAY AFTER CHEMOTHERAPY. 40 tablet 2   doxepin (SINEQUAN) 10 MG capsule Take 1 capsule (10 mg total) by mouth at bedtime. 30 minutes prior to sleeping 30 capsule 2   estradiol (ESTRACE) 0.1 MG/GM vaginal cream Place 1 Applicatorful vaginally daily.     folic acid  (FOLVITE ) 1 MG tablet TAKE 1 TABLET BY MOUTH EVERY DAY 90 tablet 0   loratadine (CLARITIN) 10 MG tablet Take 10 mg by mouth daily.     Multiple Vitamin (MULTIVITAMIN WITH MINERALS) TABS tablet Take 1 tablet by mouth every morning.     omeprazole  (PRILOSEC) 20 MG capsule TAKE 1 CAPSULE BY MOUTH EVERY DAY 90 capsule 0   ondansetron  (ZOFRAN ) 8 MG tablet TAKE 1 TABLET BY MOUTH BEFORE CHEMO 30 tablet 1   phenazopyridine  (PYRIDIUM ) 200 MG tablet Take 1 tablet (200 mg total) by mouth every 8 (eight) hours as needed. 10 tablet 0   prochlorperazine  (COMPAZINE ) 10 MG tablet Take 1 tablet (10 mg total) by mouth every 6 (six) hours as needed for nausea or vomiting. 60 tablet 0   ramelteon (ROZEREM) 8 MG tablet Take 1 tablet (8 mg total) by mouth at bedtime. 30 tablet 1   rosuvastatin  (CRESTOR ) 10 MG tablet Take 1 tablet (10 mg total) by mouth daily. 30 tablet 12   senna-docusate (SENOKOT-S) 8.6-50 MG tablet Take 1 tablet by mouth daily. 30 tablet prn   temazepam  (RESTORIL ) 22.5 MG capsule Take 1 capsule (22.5 mg total) by mouth at bedtime as needed for sleep. 30 capsule 0   No current facility-administered medications for this visit.    SURGICAL HISTORY:  Past Surgical History:  Procedure Laterality Date   ABDOMINAL HYSTERECTOMY     COLOSTOMY TAKEDOWN N/A 07/10/2014   Procedure: LAPAROSCOPIC LYSIS OF ADHESIONS (90 MIN) LAPAROSCOPIC ASSISTED COLOSTOMY CLOSURE, RIGID PROCTOSIGMOIDOSCOPY;  Surgeon: Krystal Russell, MD;  Location: [facility];  Service: General;  Laterality: N/A;   LAPAROTOMY N/A 11/03/2013   Procedure: EXPLORATORY LAPAROTOMY, DRAINAGE OF INTRA  ABDOMINAL ABSCESSES, MOBILIZATION OF SPLENIC FLEXURE, SIGMOID COLECTOMY WITH COLOSTOMY;   Surgeon: Krystal JINNY Russell, MD;  Location: [facility];  Service: General;  Laterality: N/A;   VIDEO BRONCHOSCOPY Bilateral 08/30/2013   Procedure: VIDEO BRONCHOSCOPY WITH FLUORO;  Surgeon: Ozell KATHEE America, MD;  Location: [facility];  Service: Cardiopulmonary;  Laterality: Bilateral;    REVIEW OF SYSTEMS:  Constitutional: negative Eyes: negative Ears, nose, mouth, throat, and face: negative Respiratory: negative Cardiovascular: negative Gastrointestinal: negative Genitourinary:negative Integument/breast: negative Hematologic/lymphatic: negative Musculoskeletal:negative Neurological: negative Behavioral/Psych: positive for sleep disturbance Endocrine: negative Allergic/Immunologic: negative   PHYSICAL EXAMINATION: General appearance: alert, cooperative, and no distress Head: Normocephalic, without obvious abnormality, atraumatic Neck: no adenopathy, no JVD, supple, symmetrical, trachea midline, and thyroid  not enlarged, symmetric, no tenderness/mass/nodules Lymph nodes: Cervical, supraclavicular, and axillary nodes normal. Resp: clear to auscultation bilaterally Back: symmetric, no curvature. ROM normal. No CVA tenderness. Cardio: regular rate and rhythm, S1, S2 normal, no murmur, click, rub or gallop GI: soft, non-tender; bowel sounds normal; no masses,  no organomegaly Extremities: extremities normal, atraumatic, no cyanosis or edema Neurologic: Alert and oriented  X 3, normal strength and tone. Normal symmetric reflexes. Normal coordination and gait   ECOG PERFORMANCE STATUS: 1 - Symptomatic but completely ambulatory   Blood pressure 109/71, pulse (!) 110, temperature 97.6 F (36.4 C), temperature source Temporal, resp. rate 17, height 5' 4.5 (1.638 m), weight 163 lb (73.9 kg), SpO2 100%.  LABORATORY DATA: Lab Results  Component Value Date   WBC 6.3 07/02/2024   HGB 12.8 07/02/2024   HCT 39.7 07/02/2024   MCV 94.1 07/02/2024   PLT 225 07/02/2024      Chemistry       Component Value Date/Time   NA 140 07/02/2024 1008   NA 140 11/03/2022 0844   NA 139 08/14/2017 0837   K 4.0 07/02/2024 1008   K 4.4 08/14/2017 0837   CL 104 07/02/2024 1008   CO2 26 07/02/2024 1008   CO2 24 08/14/2017 0837   BUN 14 07/02/2024 1008   BUN 33 (H) 11/03/2022 0844   BUN 13.3 08/14/2017 0837   CREATININE 1.10 (H) 07/02/2024 1008   CREATININE 0.8 08/14/2017 0837      Component Value Date/Time   CALCIUM  9.6 07/02/2024 1008   CALCIUM  9.3 08/14/2017 0837   ALKPHOS 89 07/02/2024 1008   ALKPHOS 107 08/14/2017 0837   AST 12 (L) 07/02/2024 1008   AST 12 08/14/2017 0837   ALT 5 07/02/2024 1008   ALT 12 08/14/2017 0837   BILITOT 0.4 07/02/2024 1008   BILITOT 0.37 08/14/2017 0837       RADIOGRAPHIC STUDIES: CT CHEST ABDOMEN PELVIS W CONTRAST Result Date: 07/07/2024 EXAM: CT CHEST, ABDOMEN AND PELVIS WITH CONTRAST 07/02/2024 11:20:34 AM TECHNIQUE: CT of the chest, abdomen and pelvis was performed with the administration of 100 mL of Omnipaque  300 intravenous contrast. Multiplanar reformatted images are provided for review. Automated exposure control, iterative reconstruction, and/or weight based adjustment of the mA/kV was utilized to reduce the radiation dose to as low as reasonably achievable. COMPARISON: CT Chest abdomen pelvis 04/04/2024. CLINICAL HISTORY: Non-small cell lung cancer, history of lung cancer in 2015. FINDINGS: CHEST: MEDIASTINUM AND LYMPH NODES: Heart is normal in size. No pericardial effusion. Stable tortuosity and calcification of the thoracic aorta, but no aneurysm or dissection. Scattered coronary artery calcifications are stable. The pulmonary arteries are grossly normal. No mediastinal or other mass or lymphadenopathy. The central airways are clear. The esophagus is unremarkable. LUNGS AND PLEURA: Stable dense right apical scarring changes consistent with radiation fibrosis. No new or progressive findings to suggest recurrent disease. Stable left apical  scarring changes. Stable underlying emphysematous changes and areas of pulmonary scarring. No new pulmonary lesions. Stable small ground-glass nodules in the right lower lobe. The largest measures 7 mm on image 106/6 and is unchanged. No new or progressive findings. Recommend continued surveillance. No acute pulmonary process. No pleural lesions. No pleural effusion or pneumothorax. ABDOMEN AND PELVIS: LIVER: No hepatic lesions to suggest metastatic disease. GALLBLADDER AND BILE DUCTS: The gallbladder is normal. No biliary dilatation. SPLEEN: The spleen is normal. PANCREAS: The pancreas is normal. ADRENAL GLANDS: The adrenal glands are unremarkable. No adrenal lesions. KIDNEYS, URETERS AND BLADDER: The kidneys are unremarkable. No renal lesions. No stones in the kidneys or ureters. No hydronephrosis. No perinephric or periureteral stranding. Slight thickening and enhancement of the bladder wall could suggest cystitis. Recommend correlation with clinical findings. GI AND BOWEL: The stomach, duodenum, small bowel, and colon are unremarkable. No acute inflammatory process, mass lesions, or obstructive findings. The terminal ileum and appendix are normal. Stable  surgical changes involving the lower sigmoid colon. REPRODUCTIVE ORGANS: The uterus is surgically absent. Both ovaries are still present and contain unchanged small cyst. PERITONEUM AND RETROPERITONEUM: Stable small anterior abdominal wall hernia containing fat. No free air or free fluid. VASCULATURE: Stable advanced atherosclerotic calcifications involving the aorta and iliac arteries but no aneurysm or dissection or occlusion. The major venous structures are patent. ABDOMINAL AND PELVIS LYMPH NODES: No mesenteric or retroperitoneal lymphadenopathy. BONES AND SOFT TISSUES: No findings suspicious for osseous metastatic disease. No worrisome lytic or sclerotic bone lesions. No focal soft tissue abnormality. IMPRESSION: 1. No evidence of metastatic disease. 2.  Stable post-treatment right apical radiation fibrosis without recurrent disease. 3. Stable small ground-glass nodules in the right lower lobe, largest 7 mm, unchanged; recommend continued surveillance. 4. Mild bladder wall thickening and enhancement, which may reflect cystitis; recommend clinical assessment and urinalysis as indicated. Electronically signed by: Maude Stammer MD 07/07/2024 02:48 PM EST RP Workstation: [id number]      ASSESSMENT AND PLAN:  This is a very pleasant 73 years old white female with metastatic non-small cell lung cancer, adenocarcinoma status post induction systemic chemotherapy with carboplatin  and Alimta  with partial response.  The patient has no actionable EGFR or ALK mutations at the time of her diagnosis. The patient underwent maintenance treatment with single agent Alimta  status post 170 cycles.  She tolerated this treatment fairly well with no concerning adverse effects.  Last dose of treatment was given on April 08, 2024.  The patient is currently on a break of chemotherapy. She had repeat CT scan of the chest, abdomen and pelvis performed recently.  I personally independently reviewed the scan and discussed the result with the patient and her husband.  Her scan showed no concerning findings for disease progression. Assessment and Plan Assessment & Plan Stage IV non-small cell lung cancer, adenocarcinoma Diagnosed in January 2015. Previously treated with systemic chemotherapy (carboplatin  and Alimta ) followed by maintenance Alimta . Currently on observation with no evidence of disease progression on recent CT scan of the chest, abdomen, and pelvis. No symptoms such as chest pain, dyspnea, nausea, vomiting, or diarrhea reported. A cyst on the ovary noted but not concerning at this time. - Continue observation without treatment. - Will schedule repeat CT scan in three months. She was advised to call immediately if she has any other concerning symptoms in the  interval. The patient voices understanding of current disease status and treatment options and is in agreement with the current care plan. All questions were answered. The patient knows to call the clinic with any problems, questions or concerns. We can certainly see the patient much sooner if necessary.  Disclaimer: This note was dictated with voice recognition software. Similar sounding words can inadvertently be transcribed and may not be corrected upon review.

## 2024-07-10 ENCOUNTER — Other Ambulatory Visit: Payer: Self-pay | Admitting: Family Medicine

## 2024-07-16 DIAGNOSIS — N3001 Acute cystitis with hematuria: Secondary | ICD-10-CM | POA: Diagnosis not present

## 2024-07-16 DIAGNOSIS — N390 Urinary tract infection, site not specified: Secondary | ICD-10-CM | POA: Diagnosis not present

## 2024-07-24 ENCOUNTER — Telehealth: Payer: Self-pay | Admitting: *Deleted

## 2024-07-24 NOTE — Telephone Encounter (Signed)
 Patient's spouse called and states patient is scheduled for labs on 10/01/2024 and office visit on 10/09/2024.  States she normally have a follow up scan before coming in for a visit.  Wants to know if patient needs a scan scheduled before her office visit?  Called patient's spouse back.  Informed him per the doctor's note, patient need to call the CT dept. @ [phone] to schedule an appointment on or around 10/01/2024.  Also, informed him that she will need to have labs done an hour prior to scheduled scan.  Spouse verbalized understanding.

## 2024-07-29 ENCOUNTER — Encounter

## 2024-07-29 NOTE — Progress Notes (Signed)
 CLINICAL HISTORY: 73 year old female with treated metastatic lung cancer. Restaging. Follow up call to discuss results from brain MRI on 07/30/24.    IMPRESSION: 1. Stable treated Right parietal metastasis. 2. No new metastatic disease or acute intracranial abnormality. Extensive chronic small vessel disease.  No complaints of pain today per patient.

## 2024-07-30 ENCOUNTER — Ambulatory Visit: Payer: Self-pay | Admitting: Radiation Oncology

## 2024-07-30 ENCOUNTER — Inpatient Hospital Stay
Admission: RE | Admit: 2024-07-30 | Discharge: 2024-07-30 | Disposition: A | Source: Ambulatory Visit | Attending: Radiation Oncology | Admitting: Radiation Oncology

## 2024-07-30 DIAGNOSIS — C7931 Secondary malignant neoplasm of brain: Secondary | ICD-10-CM | POA: Diagnosis not present

## 2024-07-30 MED ORDER — GADOPICLENOL 0.5 MMOL/ML IV SOLN
7.0000 mL | Freq: Once | INTRAVENOUS | Status: AC | PRN
  Administered 2024-07-30: 7 mL via INTRAVENOUS

## 2024-07-31 ENCOUNTER — Other Ambulatory Visit

## 2024-08-05 ENCOUNTER — Encounter

## 2024-08-05 ENCOUNTER — Ambulatory Visit
Admission: RE | Admit: 2024-08-05 | Discharge: 2024-08-05 | Attending: Radiation Oncology | Admitting: Radiation Oncology

## 2024-08-05 DIAGNOSIS — C7931 Secondary malignant neoplasm of brain: Secondary | ICD-10-CM

## 2024-08-05 DIAGNOSIS — Z87891 Personal history of nicotine dependence: Secondary | ICD-10-CM | POA: Diagnosis not present

## 2024-08-05 DIAGNOSIS — C3411 Malignant neoplasm of upper lobe, right bronchus or lung: Secondary | ICD-10-CM | POA: Diagnosis not present

## 2024-08-05 NOTE — Progress Notes (Signed)
 Radiation Oncology         [phone] ________________________________  Outpatient Follow Up- Conducted via telephone at patient request.  I spoke with the patient to conduct this visit via telephone. The patient was notified in advance and was offered an in person or telemedicine meeting to allow for face to face communication but instead preferred to proceed with a telephone visit.  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  Prior Radiation:    10/28/2013 through 12/05/2013:   The patient was treated to the right lung tumor to a dose of 50 gray in 25 fractions using a 3-D conformal technique. Daily image guidance was used for the patient's treatment.  10/16/2013 SRS Treatment: PTV1: Rt Parietal 9mm target was treated using 3 Arcs to a prescription dose of 20 Gy. ExacTrac Snap verification was performed for each couch angle.  Narrative:     In summary this is a 73 y.o. patient with a history of metastatic lung cancer to the brain who was treated in two sessions in 2015 for her brain disease, followed by local control to the right lung.  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 She is no longer taking these medications.  She has been on a break from systemic therapy with her last infusion of maintenance Alimta  with Dr. Sherrod on 04/09/24 with a total of 170 cycles.   She has remained in surveillance in the brain and her most recent MRI on 07/30/24 did not show concerns for any active disease but showed post treatment changes consistent with prior SBRT. She's contacted by phone to review these results.   ROS: The patient is doing very well. She denies any concerns with headaches, visual or  movement deficits. No other complaints are verbalized.   Past Medical History:  Past Medical History:  Diagnosis Date   Anxiety    Anxiety 06/20/2016   Cervical cancer (HCC)    Colostomy in place [facility]) 07/10/2014   Diabetes mellitus without complication [facility])    patient states she has type 2   Encounter for antineoplastic chemotherapy 07/20/2015   Malignant neoplasm of right upper lobe of lung (HCC)     non small cell lung cancer adenocarcioma with brain meta    Past Surgical History: Past Surgical History:  Procedure Laterality Date   ABDOMINAL HYSTERECTOMY     COLOSTOMY TAKEDOWN N/A 07/10/2014   Procedure: LAPAROSCOPIC LYSIS OF ADHESIONS (90 MIN) LAPAROSCOPIC ASSISTED COLOSTOMY CLOSURE, RIGID PROCTOSIGMOIDOSCOPY;  Surgeon: Krystal Russell, MD;  Location: [facility];  Service: General;  Laterality: N/A;   LAPAROTOMY N/A 11/03/2013   Procedure: EXPLORATORY LAPAROTOMY, DRAINAGE OF INTRA  ABDOMINAL ABSCESSES, MOBILIZATION OF SPLENIC FLEXURE, SIGMOID COLECTOMY WITH COLOSTOMY;  Surgeon: Krystal JINNY Russell, MD;  Location: [facility];  Service: General;  Laterality: N/A;   VIDEO BRONCHOSCOPY Bilateral 08/30/2013   Procedure: VIDEO BRONCHOSCOPY WITH FLUORO;  Surgeon: Ozell KATHEE America, MD;  Location: [facility];  Service: Cardiopulmonary;  Laterality: Bilateral;    Social History:  Social History   Socioeconomic History   Marital status: Married    Spouse name: Not on file   Number of children: Not on file   Years of education: Not on file   Highest  education level: Not on file  Occupational History   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  Tobacco Use   Smoking status: Former    Current packs/day: 0.00    Average packs/day: 1 pack/day for 40.0 years (40.0 ttl pk-yrs)    Types: Cigarettes    Start date: 09/27/1973    Quit date: 09/27/2013    Years since quitting: 10.8   Smokeless tobacco: Never  Vaping Use   Vaping status: Never Used  Substance and Sexual Activity   Alcohol  use: No   Drug use: No   Sexual activity: Yes  Other Topics Concern   Not on file  Social History Narrative   Not on file   Social Drivers of Health   Financial Resource Strain: Low Risk  (11/10/2022)   Overall Financial Resource Strain (CARDIA)    Difficulty of Paying Living Expenses: Not hard at all  Food Insecurity: No Food Insecurity (01/26/2024)   Hunger Vital Sign    Worried About Running Out of Food in the Last Year: Never true    Ran Out of Food in the Last Year: Never true  Transportation Needs: No Transportation Needs (01/26/2024)   PRAPARE - Administrator, Civil Service (Medical): No    Lack of Transportation (Non-Medical): No  Physical Activity: Insufficiently Active (11/10/2022)   Exercise Vital Sign    Days of Exercise per Week: 1 day    Minutes of Exercise per Session: 20 min  Stress: Stress Concern Present (11/10/2022)   [employer] of Occupational Health - Occupational Stress Questionnaire    Feeling of Stress : To some extent  Social Connections: Moderately Isolated (11/10/2022)   Social Connection and Isolation Panel    Frequency of Communication with Friends and Family: Once a week    Frequency of Social Gatherings with Friends and Family: Once a week    Attends Religious Services: More than 4 times per year    Active Member of [employer] or Organizations: No    Attends Banker Meetings: Never    Marital Status: Married  Catering Manager Violence: Not At Risk (01/26/2024)   Humiliation, Afraid, Rape, and Kick questionnaire    Fear of Current or Ex-Partner: No    Emotionally Abused: No    Physically Abused: No    Sexually Abused: No  The patient is married. She lives in [place], [place] .  Family History: Family History  Problem Relation Age of Onset   Cancer Mother    Hypertension Mother    COPD Mother    Emphysema Father        smoked   Lung cancer Father        smoked   Breast cancer Neg Hx     ALLERGIES:  is  allergic to glimepiride , codeine, and doxepin  hcl.  Meds: Current Outpatient Medications  Medication Sig Dispense Refill   acetaminophen  (TYLENOL ) 500 MG tablet Take 500 mg by mouth every 6 (six) hours as needed for mild pain or headache. Reported on 11/02/2015     Ascorbic Acid (VITAMIN C GUMMIE PO) Take 1 each by mouth every morning.     aspirin  EC 81 MG tablet Take 1 tablet (81 mg total) by mouth daily. 150 tablet 2   Cranberry 240 MG CAPS Take 1 capsule by mouth daily. This is in her probiotic     [employer] ACID CONTROLLER 10 MG tablet SMARTSIG:1 Tablet(s) By Mouth Every 12 Hours PRN     dexamethasone  (DECADRON ) 4 MG  tablet TAKE 1 TABLET TWICE A DAY THE DAY BEFORE, THE DAY OF, AND THE DAY AFTER CHEMOTHERAPY. 40 tablet 2   doxepin  (SINEQUAN ) 10 MG capsule TAKE 1 CAPSULE (10 MG TOTAL) BY MOUTH AT BEDTIME. 30 MINUTES PRIOR TO SLEEPING 90 capsule 1   estradiol (ESTRACE) 0.1 MG/GM vaginal cream Place 1 Applicatorful vaginally daily.     folic acid  (FOLVITE ) 1 MG tablet TAKE 1 TABLET BY MOUTH EVERY DAY 90 tablet 0   loratadine (CLARITIN) 10 MG tablet Take 10 mg by mouth daily.     Multiple Vitamin (MULTIVITAMIN WITH MINERALS) TABS tablet Take 1 tablet by mouth every morning.     omeprazole  (PRILOSEC) 20 MG capsule TAKE 1 CAPSULE BY MOUTH EVERY DAY 90 capsule 0   ondansetron  (ZOFRAN ) 8 MG tablet TAKE 1 TABLET BY MOUTH BEFORE CHEMO 30 tablet 1   phenazopyridine  (PYRIDIUM ) 200 MG tablet Take 1 tablet (200 mg total) by mouth every 8 (eight) hours as needed. 10 tablet 0   prochlorperazine  (COMPAZINE ) 10 MG tablet Take 1 tablet (10 mg total) by mouth every 6 (six) hours as needed for nausea or vomiting. 60 tablet 0   ramelteon  (ROZEREM ) 8 MG tablet Take 1 tablet (8 mg total) by mouth at bedtime. 30 tablet 1   rosuvastatin  (CRESTOR ) 10 MG tablet Take 1 tablet (10 mg total) by mouth daily. 30 tablet 12   senna-docusate (SENOKOT-S) 8.6-50 MG tablet Take 1 tablet by mouth daily. 30 tablet prn   temazepam   (RESTORIL ) 22.5 MG capsule Take 1 capsule (22.5 mg total) by mouth at bedtime as needed for sleep. 30 capsule 0   No current facility-administered medications for this encounter.    Physical Findings: Unable to assess due to type of encounter  Lab Findings: Lab Results  Component Value Date   WBC 6.3 07/02/2024   HGB 12.8 07/02/2024   HCT 39.7 07/02/2024   MCV 94.1 07/02/2024   PLT 225 07/02/2024     Radiographic Findings: MR Brain W Wo Contrast Result Date: 07/30/2024 EXAM: MRI BRAIN WITH AND WITHOUT CONTRAST 07/30/2024 11:49:00 AM TECHNIQUE: Multiplanar multisequence MRI of the head/brain was performed with and without the administration of intravenous contrast. COMPARISON: Brain MRI 01/23/2024 and earlier. CLINICAL HISTORY: 73 year old female with treated metastatic lung cancer. Restaging. FINDINGS: BRAIN AND VENTRICLES: Following contrast the major dural venous sinuses are enhancing and appear patent. Patchy enhancement in an area of about 15 mm of the right lateral parietal lobe remains stable (series 13, image 108). Regional T2 and FLAIR hyperintensity there also unchanged. No regional mass effect. Small areas of unsuppressed vascular related enhancement suspected elsewhere on series 13 (such as the right superior frontal gyrus series 13, image 124). Comparing postcontrast MRI to previous exam, no new or suspicious intracranial enhancement identified. No acute infarct. No acute intracranial hemorrhage. No hydrocephalus. The sella is unremarkable. Normal flow voids. Extensive chronic cerebral white matter T2 and FLAIR hyperintensity in both hemispheres appears small vessel related, including bilateral deep gray nuclei involvement. No chronic cerebral blood products outside of the treatment area on SWI. No new signal abnormality identified. ORBITS: No acute abnormality. SINUSES: No acute abnormality. BONES AND SOFT TISSUES: Bone marrow signal stable and within normal limits. No acute soft  tissue abnormality. Negative visible cervical spine and spinal cord. IMPRESSION: 1. Stable treated Right parietal metastasis. 2. No new metastatic disease or acute intracranial abnormality. Extensive chronic small vessel disease. Electronically signed by: Helayne Hurst MD 07/30/2024 12:14 PM EST RP Workstation: [id number]  Impression/Plan: 1. Stage IV, cT2aN0M1b, NSCLC of the RUL consistent with adenocarcinoma with metastasis to the brain. The patient continues to clinically and radiographically be without disease in the brain. She is on a break from systemic therapy with Dr. Sherrod. We will plan to continue our surveillance schedule with brain MRI in 6 months time.      This encounter was conducted via telephone.  The patient has provided two factor identification and has given verbal consent for this type of encounter and has been advised to only accept a meeting of this type in a secure network environment. The time spent during this encounter was 35 minutes including preparation, discussion, and coordination of the patient's care. The attendants for this meeting include  Donald Estefana Husband  and Alexis [name] and her husband Alexis [name].  During the encounter, Donald Estefana Husband was located remotely at home.  Alexis [name] was located at home with her husband Delesha Pohlman.       Donald KYM Husband, PAC

## 2024-08-05 NOTE — Addendum Note (Signed)
 Encounter addended by: Lanell Donald Stagger, PA-C on: 08/05/2024 3:54 PM  Actions taken: Clinical Note Signed, Level of Service modified

## 2024-08-06 ENCOUNTER — Other Ambulatory Visit: Payer: Self-pay | Admitting: Radiation Therapy

## 2024-08-06 DIAGNOSIS — C7931 Secondary malignant neoplasm of brain: Secondary | ICD-10-CM

## 2024-08-07 ENCOUNTER — Other Ambulatory Visit: Payer: Self-pay | Admitting: Physician Assistant

## 2024-08-07 DIAGNOSIS — C3411 Malignant neoplasm of upper lobe, right bronchus or lung: Secondary | ICD-10-CM

## 2024-09-11 ENCOUNTER — Other Ambulatory Visit

## 2024-09-11 DIAGNOSIS — E1169 Type 2 diabetes mellitus with other specified complication: Secondary | ICD-10-CM

## 2024-09-11 DIAGNOSIS — E119 Type 2 diabetes mellitus without complications: Secondary | ICD-10-CM

## 2024-09-12 ENCOUNTER — Ambulatory Visit: Payer: Self-pay | Admitting: Family Medicine

## 2024-09-12 LAB — MICROALBUMIN / CREATININE URINE RATIO
Creatinine, Urine: 83.9 mg/dL
Microalb/Creat Ratio: 7 mg/g{creat} (ref 0–29)
Microalbumin, Urine: 5.7 ug/mL

## 2024-09-12 LAB — COMPREHENSIVE METABOLIC PANEL WITH GFR
ALT: 10 IU/L (ref 0–32)
AST: 12 IU/L (ref 0–40)
Albumin: 3.8 g/dL (ref 3.8–4.8)
Alkaline Phosphatase: 101 IU/L (ref 49–135)
BUN/Creatinine Ratio: 18 (ref 12–28)
BUN: 17 mg/dL (ref 8–27)
Bilirubin Total: 0.3 mg/dL (ref 0.0–1.2)
CO2: 24 mmol/L (ref 20–29)
Calcium: 9.5 mg/dL (ref 8.7–10.3)
Chloride: 102 mmol/L (ref 96–106)
Creatinine, Ser: 0.94 mg/dL (ref 0.57–1.00)
Globulin, Total: 2.6 g/dL (ref 1.5–4.5)
Glucose: 118 mg/dL — ABNORMAL HIGH (ref 70–99)
Potassium: 4 mmol/L (ref 3.5–5.2)
Sodium: 141 mmol/L (ref 134–144)
Total Protein: 6.4 g/dL (ref 6.0–8.5)
eGFR: 64 mL/min/1.73

## 2024-09-12 LAB — LIPID PANEL
Chol/HDL Ratio: 4.6 ratio — ABNORMAL HIGH (ref 0.0–4.4)
Cholesterol, Total: 173 mg/dL (ref 100–199)
HDL: 38 mg/dL — ABNORMAL LOW
LDL Chol Calc (NIH): 106 mg/dL — ABNORMAL HIGH (ref 0–99)
Triglycerides: 165 mg/dL — ABNORMAL HIGH (ref 0–149)
VLDL Cholesterol Cal: 29 mg/dL (ref 5–40)

## 2024-09-12 LAB — HEMOGLOBIN A1C
Est. average glucose Bld gHb Est-mCnc: 131 mg/dL
Hgb A1c MFr Bld: 6.2 % — ABNORMAL HIGH (ref 4.8–5.6)

## 2024-09-18 ENCOUNTER — Encounter: Payer: Self-pay | Admitting: Family Medicine

## 2024-09-18 ENCOUNTER — Ambulatory Visit (INDEPENDENT_AMBULATORY_CARE_PROVIDER_SITE_OTHER): Admitting: Family Medicine

## 2024-09-18 VITALS — BP 93/55 | HR 113 | Ht 64.5 in | Wt 153.1 lb

## 2024-09-18 DIAGNOSIS — E119 Type 2 diabetes mellitus without complications: Secondary | ICD-10-CM

## 2024-09-18 DIAGNOSIS — C7949 Secondary malignant neoplasm of other parts of nervous system: Secondary | ICD-10-CM | POA: Diagnosis not present

## 2024-09-18 DIAGNOSIS — C3411 Malignant neoplasm of upper lobe, right bronchus or lung: Secondary | ICD-10-CM | POA: Diagnosis not present

## 2024-09-18 DIAGNOSIS — G4700 Insomnia, unspecified: Secondary | ICD-10-CM

## 2024-09-18 DIAGNOSIS — E1169 Type 2 diabetes mellitus with other specified complication: Secondary | ICD-10-CM | POA: Diagnosis not present

## 2024-09-18 DIAGNOSIS — J449 Chronic obstructive pulmonary disease, unspecified: Secondary | ICD-10-CM | POA: Diagnosis not present

## 2024-09-18 DIAGNOSIS — E785 Hyperlipidemia, unspecified: Secondary | ICD-10-CM | POA: Diagnosis not present

## 2024-09-18 DIAGNOSIS — C7931 Secondary malignant neoplasm of brain: Secondary | ICD-10-CM | POA: Diagnosis not present

## 2024-09-18 MED ORDER — SUVOREXANT 5 MG PO TABS: 5.0000 mg | ORAL_TABLET | Freq: Every day | ORAL | 2 refills | Status: AC

## 2024-09-18 NOTE — Progress Notes (Signed)
 "   Subjective   Patient ID: Alexis [name], female    DOB: Apr 20, 1951  Age: 74 y.o. MRN: [id number]  Chief Complaint  Patient presents with   Medical Management of Chronic Issues     History of Present Illness   Alexis [name] is a 74 year old female who presents for a follow-up visit.  She recently reviewed labs that were largely normal. She is off cholesterol medication due to prior numbness and sleep disturbance on statins, and her cholesterol remains stable without treatment.  She is not receiving cancer treatment currently. Her tumor size has markedly decreased and she has an MRI planned. Not currently receiving treatment for the past few months  She has chronic severe insomnia, sleeping 2 to 3 hours per night with daytime fatigue. She has failed or not tolerated several sleep medications, including doxepin  due to allergy and Restoril  due to cost. She has increased salads and fruits in her diet.  She takes daily vitamins, a daily antibiotic from her urologist, indigestion medication, and low-dose aspirin . She has had no infections since starting the antibiotic in November.  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 Prior metal-level testing was reportedly not concerning. She has stiffness and swelling in the affected leg.  Her diabetes is controlled, with recent A1c 6.2 and fasting blood sugar 118. She has lost weight unintentionally, which she links to dietary changes.          The 10-year ASCVD risk score (Arnett DK, et al., 2019) is: 12.2%  Health Maintenance Due  Topic Date Due   OPHTHALMOLOGY EXAM  Never done   Hepatitis C Screening  Never done   Pneumococcal Vaccine: 50+ Years (2 of 2 - PCV) 07/26/2020   FOOT EXAM  09/06/2022   Medicare Annual Wellness (AWV)  11/10/2023   COVID-19 Vaccine (5 - 2025-26 season) 04/29/2024      Objective:     BP (!) 93/55   Pulse (!) 113   Ht 5' 4.5 (1.638 m)   Wt 153 lb 1.9 oz (69.5  kg)   SpO2 100%   BMI 25.88 kg/m    Physical Exam   Gen: alert, oriented CV: rrr CHEST: Lungs clear to auscultation bilaterally. No wheezes, rhonchi, or crackles.        No results found for any visits on 09/18/24.      Assessment & Plan:   Insomnia, unspecified type Assessment & Plan: Chronic insomnia with difficulty maintaining sleep. Previous medications ineffective or caused adverse effects. Considering Suvorexant  as a non-dependent alternative. Discussed potential side effects. Insurance coverage uncertain. - Prescribed Suvorexant  5 mg. - Provided information on Suvorexant . - Consider alternatives if not covered or too expensive.   Diabetes mellitus without complication (HCC) Assessment & Plan: Well-controlled with A1c of 6.2 and fasting blood sugar of 118. Stable control indicated by previous A1c of 5.7. - Continue current diabetes management plan.    Secondary malignant neoplasm of brain and spinal cord [facility]) Assessment & Plan: Metastatic brain lesion noted in 2015.  Recent mri brains have shown stable right parietal metastasis with no new lesions. Has mri brain upcoming in June 2026. Alexis [name]    Hyperlipidemia due to type 2 diabetes mellitus (HCC) Assessment & Plan: Did not tolerate rosuvastatin .  Does not want to restart or try other statins.  Ldl is mildly elevated ~100.  Continue to monitor.     COPD mixed type [facility]) Assessment & Plan: Continue to monitor.  Stable    Primary cancer of right upper lobe of lung [facility]) Assessment & Plan: Stage IV (T2a, N0, M1b) non-small cell lung cancer, adenocarcinoma with negative EGFR and ALK mutations diagnosed in January 2015.  Holding chemotherapy since aug 2025 and monitoring w/ mri brain and ct chest/abd/pelvis   Other orders -     Suvorexant ; Take 1 tablet (5 mg total) by mouth at bedtime.  Dispense: 30 tablet; Refill: 2        Return in about 3 months (around 12/17/2024) for sleep dm.    Toribio MARLA Slain,  MD  "

## 2024-09-18 NOTE — Patient Instructions (Signed)
" °  VISIT SUMMARY: Today, we reviewed your recent lab results, discussed your chronic insomnia, and evaluated your diabetes management. We also talked about your current medications and dietary changes.  YOUR PLAN: INSOMNIA: You have chronic insomnia and have had difficulty finding effective medication. -We are prescribing Suvorexant  5 mg as a new medication to help with your sleep. Please review the information provided about Suvorexant . -If it is not covered or too expensive, we will consider other alternatives.  TYPE 2 DIABETES MELLITUS: Your diabetes is well-controlled with an A1c of 6.2 and fasting blood sugar of 118. -Continue with your current diabetes management plan.   "

## 2024-09-21 NOTE — Assessment & Plan Note (Signed)
Continue to monitor.  Stable.

## 2024-09-21 NOTE — Assessment & Plan Note (Signed)
 Chronic insomnia with difficulty maintaining sleep. Previous medications ineffective or caused adverse effects. Considering Suvorexant  as a non-dependent alternative. Discussed potential side effects. Insurance coverage uncertain. - Prescribed Suvorexant  5 mg. - Provided information on Suvorexant . - Consider alternatives if not covered or too expensive.

## 2024-09-21 NOTE — Assessment & Plan Note (Addendum)
 Stage IV (T2a, N0, M1b) non-small cell lung cancer, adenocarcinoma with negative EGFR and ALK mutations diagnosed in January 2015.  Holding chemotherapy since aug 2025 and monitoring w/ mri brain and ct chest/abd/pelvis

## 2024-09-21 NOTE — Assessment & Plan Note (Signed)
 Did not tolerate rosuvastatin .  Does not want to restart or try other statins.  Ldl is mildly elevated ~100.  Continue to monitor.

## 2024-09-21 NOTE — Assessment & Plan Note (Signed)
 Well-controlled with A1c of 6.2 and fasting blood sugar of 118. Stable control indicated by previous A1c of 5.7. - Continue current diabetes management plan.

## 2024-09-21 NOTE — Assessment & Plan Note (Signed)
 Metastatic brain lesion noted in 2015.  Recent mri brains have shown stable right parietal metastasis with no new lesions. Has mri brain upcoming in June 2026. Alexis [name]

## 2024-10-01 ENCOUNTER — Inpatient Hospital Stay

## 2024-10-01 ENCOUNTER — Ambulatory Visit (HOSPITAL_COMMUNITY)

## 2024-10-07 ENCOUNTER — Inpatient Hospital Stay: Attending: Internal Medicine

## 2024-10-07 ENCOUNTER — Ambulatory Visit (HOSPITAL_COMMUNITY)

## 2024-10-09 ENCOUNTER — Inpatient Hospital Stay: Admitting: Internal Medicine

## 2024-10-15 ENCOUNTER — Inpatient Hospital Stay: Admitting: Physician Assistant

## 2024-12-18 ENCOUNTER — Ambulatory Visit: Admitting: Family Medicine

## 2025-01-28 ENCOUNTER — Other Ambulatory Visit

## 2025-02-03 ENCOUNTER — Inpatient Hospital Stay

## 2025-02-03 ENCOUNTER — Ambulatory Visit: Admitting: Radiation Oncology
